# Patient Record
Sex: Male | Born: 1970 | Race: Black or African American | Hispanic: No | Marital: Married | Smoking: Never smoker
Health system: Southern US, Community
[De-identification: ages and names within clinical notes are randomized; demographics above are authoritative.]

## PROBLEM LIST (undated history)

## (undated) VITALS — BP 144/82 | HR 88 | Temp 98.8°F | Resp 20 | Ht 66.97 in | Wt 256.8 lb

## (undated) DIAGNOSIS — I10 Essential (primary) hypertension: Secondary | ICD-10-CM

## (undated) DIAGNOSIS — I509 Heart failure, unspecified: Secondary | ICD-10-CM

## (undated) DIAGNOSIS — I2699 Other pulmonary embolism without acute cor pulmonale: Secondary | ICD-10-CM

## (undated) DIAGNOSIS — I82409 Acute embolism and thrombosis of unspecified deep veins of unspecified lower extremity: Secondary | ICD-10-CM

## (undated) DIAGNOSIS — I499 Cardiac arrhythmia, unspecified: Secondary | ICD-10-CM

## (undated) DIAGNOSIS — R0902 Hypoxemia: Secondary | ICD-10-CM

## (undated) DIAGNOSIS — E119 Type 2 diabetes mellitus without complications: Secondary | ICD-10-CM

## (undated) DIAGNOSIS — I498 Other specified cardiac arrhythmias: Secondary | ICD-10-CM

## (undated) DIAGNOSIS — Z95828 Presence of other vascular implants and grafts: Secondary | ICD-10-CM

## (undated) DIAGNOSIS — R29898 Other symptoms and signs involving the musculoskeletal system: Secondary | ICD-10-CM

## (undated) DIAGNOSIS — N189 Chronic kidney disease, unspecified: Secondary | ICD-10-CM

## (undated) DIAGNOSIS — K760 Fatty (change of) liver, not elsewhere classified: Secondary | ICD-10-CM

## (undated) DIAGNOSIS — I5030 Unspecified diastolic (congestive) heart failure: Secondary | ICD-10-CM

## (undated) DIAGNOSIS — Z6841 Body Mass Index (BMI) 40.0 and over, adult: Secondary | ICD-10-CM

## (undated) DIAGNOSIS — I739 Peripheral vascular disease, unspecified: Secondary | ICD-10-CM

## (undated) HISTORY — DX: Unspecified diastolic (congestive) heart failure: I50.30

## (undated) HISTORY — DX: Fatty (change of) liver, not elsewhere classified: K76.0

## (undated) HISTORY — DX: Type 2 diabetes mellitus without complications: E11.9

## (undated) HISTORY — DX: Cardiac arrhythmia, unspecified: I49.9

## (undated) HISTORY — DX: Heart failure, unspecified: I50.9

## (undated) HISTORY — DX: Chronic kidney disease, unspecified: N18.9

## (undated) HISTORY — DX: Hypoxemia: R09.02

## (undated) HISTORY — DX: Other specified cardiac arrhythmias: I49.8

## (undated) HISTORY — DX: Other pulmonary embolism without acute cor pulmonale: I26.99

## (undated) HISTORY — DX: Body Mass Index (BMI) 40.0 and over, adult: Z684

## (undated) HISTORY — DX: Other symptoms and signs involving the musculoskeletal system: R29.898

## (undated) HISTORY — DX: Morbid (severe) obesity due to excess calories: E66.01

---

## 2004-07-08 ENCOUNTER — Other Ambulatory Visit: Payer: Self-pay

## 2004-07-08 ENCOUNTER — Inpatient Hospital Stay: Payer: Self-pay | Admitting: Internal Medicine

## 2004-08-24 ENCOUNTER — Other Ambulatory Visit: Payer: Self-pay

## 2004-08-24 ENCOUNTER — Emergency Department: Payer: Self-pay | Admitting: General Practice

## 2004-10-22 ENCOUNTER — Other Ambulatory Visit: Payer: Self-pay

## 2004-10-22 ENCOUNTER — Emergency Department: Payer: Self-pay | Admitting: Emergency Medicine

## 2005-07-13 ENCOUNTER — Emergency Department: Payer: Self-pay | Admitting: Emergency Medicine

## 2007-12-27 ENCOUNTER — Inpatient Hospital Stay: Payer: Self-pay | Admitting: Internal Medicine

## 2007-12-28 ENCOUNTER — Ambulatory Visit: Payer: Self-pay | Admitting: Internal Medicine

## 2008-01-04 ENCOUNTER — Ambulatory Visit: Payer: Self-pay | Admitting: Internal Medicine

## 2008-01-29 ENCOUNTER — Ambulatory Visit: Payer: Self-pay | Admitting: Internal Medicine

## 2010-10-29 ENCOUNTER — Encounter: Payer: Self-pay | Admitting: Cardiothoracic Surgery

## 2010-11-27 ENCOUNTER — Encounter: Payer: Self-pay | Admitting: Nurse Practitioner

## 2010-11-29 ENCOUNTER — Encounter: Payer: Self-pay | Admitting: Cardiothoracic Surgery

## 2010-11-29 ENCOUNTER — Encounter: Payer: Self-pay | Admitting: Nurse Practitioner

## 2010-12-29 ENCOUNTER — Encounter: Payer: Self-pay | Admitting: Cardiothoracic Surgery

## 2010-12-29 ENCOUNTER — Encounter: Payer: Self-pay | Admitting: Nurse Practitioner

## 2011-01-29 ENCOUNTER — Encounter: Payer: Self-pay | Admitting: Nurse Practitioner

## 2011-01-29 ENCOUNTER — Encounter: Payer: Self-pay | Admitting: Cardiothoracic Surgery

## 2011-02-28 ENCOUNTER — Encounter: Payer: Self-pay | Admitting: Nurse Practitioner

## 2011-02-28 ENCOUNTER — Encounter: Payer: Self-pay | Admitting: Cardiothoracic Surgery

## 2011-03-31 ENCOUNTER — Encounter: Payer: Self-pay | Admitting: Cardiothoracic Surgery

## 2011-03-31 ENCOUNTER — Encounter: Payer: Self-pay | Admitting: Nurse Practitioner

## 2012-03-15 ENCOUNTER — Encounter: Payer: Self-pay | Admitting: Nurse Practitioner

## 2012-03-15 ENCOUNTER — Encounter: Payer: Self-pay | Admitting: Cardiothoracic Surgery

## 2012-03-30 ENCOUNTER — Encounter: Payer: Self-pay | Admitting: Cardiothoracic Surgery

## 2012-03-30 ENCOUNTER — Encounter: Payer: Self-pay | Admitting: Nurse Practitioner

## 2012-04-30 ENCOUNTER — Encounter: Payer: Self-pay | Admitting: Nurse Practitioner

## 2012-04-30 ENCOUNTER — Encounter: Payer: Self-pay | Admitting: Cardiothoracic Surgery

## 2012-10-06 ENCOUNTER — Encounter: Payer: Self-pay | Admitting: Nurse Practitioner

## 2012-10-06 ENCOUNTER — Encounter: Payer: Self-pay | Admitting: Cardiothoracic Surgery

## 2012-10-28 ENCOUNTER — Encounter: Payer: Self-pay | Admitting: Cardiothoracic Surgery

## 2012-10-28 ENCOUNTER — Encounter: Payer: Self-pay | Admitting: Nurse Practitioner

## 2013-05-30 DIAGNOSIS — Z6841 Body Mass Index (BMI) 40.0 and over, adult: Secondary | ICD-10-CM | POA: Insufficient documentation

## 2014-11-29 ENCOUNTER — Ambulatory Visit
Admit: 2014-11-29 | Disposition: A | Discharge status: Home / Self Care | Payer: Self-pay | Attending: Internal Medicine | Admitting: Internal Medicine

## 2014-12-20 ENCOUNTER — Observation Stay
Admit: 2014-12-20 | Disposition: A | Discharge status: Home / Self Care | Payer: Self-pay | Attending: Internal Medicine | Admitting: Internal Medicine

## 2014-12-20 ENCOUNTER — Other Ambulatory Visit: Payer: Self-pay | Admitting: Orthopedic Surgery

## 2014-12-20 DIAGNOSIS — R55 Syncope and collapse: Secondary | ICD-10-CM | POA: Diagnosis not present

## 2014-12-20 DIAGNOSIS — M25561 Pain in right knee: Secondary | ICD-10-CM

## 2014-12-20 LAB — URINALYSIS, COMPLETE
Leukocyte Esterase: NEGATIVE
Nitrite: NEGATIVE
Ph: 5 (ref 4.5–8.0)
Protein: 100
SPECIFIC GRAVITY: 1.028 (ref 1.003–1.030)

## 2014-12-20 LAB — COMPREHENSIVE METABOLIC PANEL
ANION GAP: 10 (ref 7–16)
AST: 28 U/L
Albumin: 4.2 g/dL
Alkaline Phosphatase: 57 U/L
BILIRUBIN TOTAL: 0.6 mg/dL
BUN: 11 mg/dL
CHLORIDE: 106 mmol/L
CO2: 22 mmol/L
Calcium, Total: 8.7 mg/dL — ABNORMAL LOW
Creatinine: 1.17 mg/dL
EGFR (African American): >60
EGFR (Non-African Amer.): >60
Glucose: 152 mg/dL — ABNORMAL HIGH
POTASSIUM: 3.7 mmol/L
SGPT (ALT): 34 U/L
Sodium: 138 mmol/L
Total Protein: 7.8 g/dL

## 2014-12-20 LAB — APTT: Activated PTT: <23 secs — ABNORMAL LOW (ref 23.6–35.9)

## 2014-12-20 LAB — CK-MB
CK-MB: 1.5 ng/mL
CK-MB: 1.5 ng/mL

## 2014-12-20 LAB — CBC
HCT: 48.2 % (ref 40.0–52.0)
HGB: 16 g/dL (ref 13.0–18.0)
MCH: 29.9 pg (ref 26.0–34.0)
MCHC: 33.2 g/dL (ref 32.0–36.0)
MCV: 90 fL (ref 80–100)
PLATELETS: 239 10*3/uL (ref 150–440)
RBC: 5.36 10*6/uL (ref 4.40–5.90)
RDW: 13.9 % (ref 11.5–14.5)
WBC: 5.9 10*3/uL (ref 3.8–10.6)

## 2014-12-20 LAB — TROPONIN I
Troponin-I: <0.03 ng/mL
Troponin-I: <0.03 ng/mL

## 2014-12-20 LAB — PROTIME-INR
INR: 1
PROTHROMBIN TIME: 13.5 s

## 2014-12-20 LAB — LIPID PANEL
CHOLESTEROL: 187 mg/dL
HDL Cholesterol: 39 mg/dL — ABNORMAL LOW
LDL CHOLESTEROL, CALC: 128 mg/dL — AB
TRIGLYCERIDES: 101 mg/dL
VLDL Cholesterol, Calc: 20 mg/dL

## 2014-12-20 LAB — CK TOTAL AND CKMB (NOT AT ARMC)
CK, Total: 263 U/L
CK-MB: 1.5 ng/mL

## 2014-12-20 LAB — LACTIC ACID, PLASMA: Lactic Acid, Venous: 2 mmol/L

## 2014-12-20 LAB — HEMOGLOBIN A1C: Hemoglobin A1C: 5.9 %

## 2014-12-21 LAB — CBC WITH DIFFERENTIAL/PLATELET
BASOS ABS: 0 10*3/uL (ref 0.0–0.1)
Basophil %: 0.6 %
EOS ABS: 0.2 10*3/uL (ref 0.0–0.7)
Eosinophil %: 4.3 %
HCT: 43.2 % (ref 40.0–52.0)
HGB: 14.2 g/dL (ref 13.0–18.0)
LYMPHS PCT: 32.1 %
Lymphocyte #: 1.8 10*3/uL (ref 1.0–3.6)
MCH: 30 pg (ref 26.0–34.0)
MCHC: 33 g/dL (ref 32.0–36.0)
MCV: 91 fL (ref 80–100)
MONOS PCT: 13.9 %
Monocyte #: 0.8 x10 3/mm (ref 0.2–1.0)
NEUTROS ABS: 2.8 10*3/uL (ref 1.4–6.5)
NEUTROS PCT: 49.1 %
Platelet: 168 10*3/uL (ref 150–440)
RBC: 4.75 10*6/uL (ref 4.40–5.90)
RDW: 14.4 % (ref 11.5–14.5)
WBC: 5.6 10*3/uL (ref 3.8–10.6)

## 2014-12-23 DIAGNOSIS — I82413 Acute embolism and thrombosis of femoral vein, bilateral: Secondary | ICD-10-CM | POA: Diagnosis not present

## 2014-12-23 DIAGNOSIS — I82423 Acute embolism and thrombosis of iliac vein, bilateral: Secondary | ICD-10-CM | POA: Diagnosis not present

## 2014-12-23 LAB — CBC
HCT: 42.8 % (ref 40.0–52.0)
HGB: 14.1 g/dL (ref 13.0–18.0)
MCH: 29.7 pg (ref 26.0–34.0)
MCHC: 33 g/dL (ref 32.0–36.0)
MCV: 90 fL (ref 80–100)
PLATELETS: 124 10*3/uL — AB (ref 150–440)
RBC: 4.77 10*6/uL (ref 4.40–5.90)
RDW: 13.8 % (ref 11.5–14.5)
WBC: 9.4 10*3/uL (ref 3.8–10.6)

## 2014-12-23 LAB — COMPREHENSIVE METABOLIC PANEL
ANION GAP: 11 (ref 7–16)
Albumin: 3.9 g/dL
Alkaline Phosphatase: 54 U/L
BUN: 14 mg/dL
Bilirubin,Total: 1.3 mg/dL — ABNORMAL HIGH
CALCIUM: 8.5 mg/dL — AB
CHLORIDE: 93 mmol/L — AB
Co2: 25 mmol/L
Creatinine: 0.99 mg/dL
EGFR (Non-African Amer.): >60
Glucose: 127 mg/dL — ABNORMAL HIGH
POTASSIUM: 3.7 mmol/L
SGOT(AST): 19 U/L
SGPT (ALT): 24 U/L
Sodium: 129 mmol/L — ABNORMAL LOW
Total Protein: 8 g/dL

## 2014-12-23 LAB — LIPASE, BLOOD: LIPASE: 24 U/L

## 2014-12-24 ENCOUNTER — Inpatient Hospital Stay
Admit: 2014-12-24 | Discharge: 2015-01-15 | DRG: 270 | Disposition: A | Discharge status: Skilled Nursing Facility | Payer: 59 | Attending: Internal Medicine | Admitting: Internal Medicine

## 2014-12-24 DIAGNOSIS — E871 Hypo-osmolality and hyponatremia: Secondary | ICD-10-CM | POA: Diagnosis present

## 2014-12-24 DIAGNOSIS — I120 Hypertensive chronic kidney disease with stage 5 chronic kidney disease or end stage renal disease: Secondary | ICD-10-CM | POA: Diagnosis present

## 2014-12-24 DIAGNOSIS — E785 Hyperlipidemia, unspecified: Secondary | ICD-10-CM | POA: Diagnosis present

## 2014-12-24 DIAGNOSIS — Z6841 Body Mass Index (BMI) 40.0 and over, adult: Secondary | ICD-10-CM | POA: Diagnosis not present

## 2014-12-24 DIAGNOSIS — E119 Type 2 diabetes mellitus without complications: Secondary | ICD-10-CM | POA: Diagnosis present

## 2014-12-24 DIAGNOSIS — K219 Gastro-esophageal reflux disease without esophagitis: Secondary | ICD-10-CM | POA: Diagnosis present

## 2014-12-24 DIAGNOSIS — I82409 Acute embolism and thrombosis of unspecified deep veins of unspecified lower extremity: Secondary | ICD-10-CM | POA: Diagnosis present

## 2014-12-24 DIAGNOSIS — I82413 Acute embolism and thrombosis of femoral vein, bilateral: Secondary | ICD-10-CM | POA: Diagnosis present

## 2014-12-24 DIAGNOSIS — Z79899 Other long term (current) drug therapy: Secondary | ICD-10-CM | POA: Diagnosis not present

## 2014-12-24 DIAGNOSIS — N170 Acute kidney failure with tubular necrosis: Secondary | ICD-10-CM | POA: Diagnosis not present

## 2014-12-24 DIAGNOSIS — I82503 Chronic embolism and thrombosis of unspecified deep veins of lower extremity, bilateral: Secondary | ICD-10-CM | POA: Diagnosis present

## 2014-12-24 DIAGNOSIS — D631 Anemia in chronic kidney disease: Secondary | ICD-10-CM | POA: Diagnosis present

## 2014-12-24 DIAGNOSIS — Z992 Dependence on renal dialysis: Secondary | ICD-10-CM

## 2014-12-24 DIAGNOSIS — Z86711 Personal history of pulmonary embolism: Secondary | ICD-10-CM | POA: Diagnosis present

## 2014-12-24 DIAGNOSIS — Z800 Family history of malignant neoplasm of digestive organs: Secondary | ICD-10-CM | POA: Diagnosis not present

## 2014-12-24 DIAGNOSIS — I82423 Acute embolism and thrombosis of iliac vein, bilateral: Principal | ICD-10-CM | POA: Diagnosis present

## 2014-12-24 DIAGNOSIS — E876 Hypokalemia: Secondary | ICD-10-CM | POA: Diagnosis not present

## 2014-12-24 DIAGNOSIS — I82220 Acute embolism and thrombosis of inferior vena cava: Secondary | ICD-10-CM | POA: Diagnosis present

## 2014-12-24 DIAGNOSIS — N179 Acute kidney failure, unspecified: Secondary | ICD-10-CM

## 2014-12-24 DIAGNOSIS — Z803 Family history of malignant neoplasm of breast: Secondary | ICD-10-CM | POA: Diagnosis not present

## 2014-12-24 DIAGNOSIS — N186 End stage renal disease: Secondary | ICD-10-CM | POA: Diagnosis not present

## 2014-12-24 LAB — URINALYSIS, COMPLETE
BILIRUBIN, UR: NEGATIVE
Glucose,UR: NEGATIVE mg/dL (ref 0–75)
KETONE: NEGATIVE
Leukocyte Esterase: NEGATIVE
Nitrite: NEGATIVE
PH: 5 (ref 4.5–8.0)
Protein: 100
Specific Gravity: >1.06 (ref 1.003–1.030)

## 2014-12-24 LAB — CBC WITH DIFFERENTIAL/PLATELET
Basophil #: 0.1 10*3/uL (ref 0.0–0.1)
Basophil %: 0.5 %
Eosinophil #: 0 10*3/uL (ref 0.0–0.7)
Eosinophil %: 0.4 %
HCT: 43.3 % (ref 40.0–52.0)
HGB: 14.6 g/dL (ref 13.0–18.0)
Lymphocyte #: 1.6 10*3/uL (ref 1.0–3.6)
Lymphocyte %: 14.7 %
MCH: 30.2 pg (ref 26.0–34.0)
MCHC: 33.7 g/dL (ref 32.0–36.0)
MCV: 90 fL (ref 80–100)
Monocyte #: 1.4 x10 3/mm — ABNORMAL HIGH (ref 0.2–1.0)
Monocyte %: 13 %
Neutrophil #: 7.7 10*3/uL — ABNORMAL HIGH (ref 1.4–6.5)
Neutrophil %: 71.4 %
Platelet: 136 10*3/uL — ABNORMAL LOW (ref 150–440)
RBC: 4.84 10*6/uL (ref 4.40–5.90)
RDW: 14.1 % (ref 11.5–14.5)
WBC: 10.7 10*3/uL — ABNORMAL HIGH (ref 3.8–10.6)

## 2014-12-24 LAB — HEMOGLOBIN: HGB: 14.4 g/dL (ref 13.0–18.0)

## 2014-12-24 LAB — PROTIME-INR
INR: 1.2
PROTHROMBIN TIME: 15.1 s — AB

## 2014-12-24 LAB — HEPARIN LEVEL (UNFRACTIONATED)
Anti-Xa(Unfractionated): 0.28 IU/mL — ABNORMAL LOW (ref 0.30–0.70)
Anti-Xa(Unfractionated): 0.26 IU/mL — ABNORMAL LOW (ref 0.30–0.70)

## 2014-12-24 LAB — APTT: Activated PTT: 29.6 secs (ref 23.6–35.9)

## 2014-12-25 LAB — BASIC METABOLIC PANEL
Anion Gap: 11 (ref 7–16)
BUN: 24 mg/dL — ABNORMAL HIGH
Calcium, Total: 8.5 mg/dL — ABNORMAL LOW
Chloride: 93 mmol/L — ABNORMAL LOW
Co2: 26 mmol/L
Creatinine: 1.42 mg/dL — ABNORMAL HIGH
EGFR (African American): >60
EGFR (Non-African Amer.): 59 — ABNORMAL LOW
Glucose: 117 mg/dL — ABNORMAL HIGH
Potassium: 3.7 mmol/L
Sodium: 130 mmol/L — ABNORMAL LOW

## 2014-12-25 LAB — URINALYSIS, COMPLETE
BILIRUBIN, UR: NEGATIVE
Bacteria: NONE SEEN
Glucose,UR: NEGATIVE mg/dL (ref 0–75)
KETONE: NEGATIVE
LEUKOCYTE ESTERASE: NEGATIVE
Nitrite: NEGATIVE
Ph: 5 (ref 4.5–8.0)
Protein: 30
SQUAMOUS EPITHELIAL: NONE SEEN
Specific Gravity: 1.012 (ref 1.003–1.030)

## 2014-12-25 LAB — HEPARIN LEVEL (UNFRACTIONATED)
Anti-Xa(Unfractionated): 0.61 IU/mL (ref 0.30–0.70)
Anti-Xa(Unfractionated): 0.53 [IU]/mL (ref 0.30–0.70)

## 2014-12-25 LAB — HEMOGLOBIN A1C: Hemoglobin A1C: 5.9 %

## 2014-12-25 LAB — CBC WITH DIFFERENTIAL/PLATELET
Basophil #: 0 10*3/uL (ref 0.0–0.1)
Basophil %: 0.4 %
Eosinophil #: 0 10*3/uL (ref 0.0–0.7)
Eosinophil %: 0.3 %
HCT: 41.8 % (ref 40.0–52.0)
HGB: 14 g/dL (ref 13.0–18.0)
Lymphocyte #: 1.6 10*3/uL (ref 1.0–3.6)
Lymphocyte %: 17.6 %
MCH: 29.9 pg (ref 26.0–34.0)
MCHC: 33.4 g/dL (ref 32.0–36.0)
MCV: 90 fL (ref 80–100)
Monocyte #: 1.3 x10 3/mm — ABNORMAL HIGH (ref 0.2–1.0)
Monocyte %: 14.1 %
Neutrophil #: 6.1 10*3/uL (ref 1.4–6.5)
Neutrophil %: 67.6 %
Platelet: 190 10*3/uL (ref 150–440)
RBC: 4.67 10*6/uL (ref 4.40–5.90)
RDW: 14 % (ref 11.5–14.5)
WBC: 9.1 10*3/uL (ref 3.8–10.6)

## 2014-12-25 LAB — TSH: Thyroid Stimulating Horm: 2.46 u[IU]/mL

## 2014-12-25 LAB — CULTURE, BLOOD (SINGLE)

## 2014-12-26 LAB — BASIC METABOLIC PANEL
Anion Gap: 12 (ref 7–16)
BUN: 31 mg/dL — ABNORMAL HIGH
Calcium, Total: 8.5 mg/dL — ABNORMAL LOW
Chloride: 92 mmol/L — ABNORMAL LOW
Co2: 29 mmol/L
Creatinine: 1.66 mg/dL — ABNORMAL HIGH
EGFR (African American): 57 — ABNORMAL LOW
EGFR (Non-African Amer.): 49 — ABNORMAL LOW
Glucose: 112 mg/dL — ABNORMAL HIGH
Potassium: 3.4 mmol/L — ABNORMAL LOW
Sodium: 133 mmol/L — ABNORMAL LOW

## 2014-12-26 LAB — CBC WITH DIFFERENTIAL/PLATELET
Basophil #: 0 10*3/uL (ref 0.0–0.1)
Basophil %: 0.6 %
Eosinophil #: 0.1 10*3/uL (ref 0.0–0.7)
Eosinophil %: 0.7 %
HCT: 40.1 % (ref 40.0–52.0)
HGB: 13.4 g/dL (ref 13.0–18.0)
Lymphocyte #: 1.5 10*3/uL (ref 1.0–3.6)
Lymphocyte %: 21.2 %
MCH: 29.9 pg (ref 26.0–34.0)
MCHC: 33.3 g/dL (ref 32.0–36.0)
MCV: 90 fL (ref 80–100)
Monocyte #: 1.1 x10 3/mm — ABNORMAL HIGH (ref 0.2–1.0)
Monocyte %: 15.6 %
Neutrophil #: 4.5 10*3/uL (ref 1.4–6.5)
Neutrophil %: 61.9 %
Platelet: 239 10*3/uL (ref 150–440)
RBC: 4.46 10*6/uL (ref 4.40–5.90)
RDW: 13.8 % (ref 11.5–14.5)
WBC: 7.2 10*3/uL (ref 3.8–10.6)

## 2014-12-26 LAB — HEPARIN LEVEL (UNFRACTIONATED): Anti-Xa(Unfractionated): 0.37 IU/mL (ref 0.30–0.70)

## 2014-12-26 LAB — HEMOGLOBIN A1C: HEMOGLOBIN A1C: 6.1 % — AB

## 2014-12-27 LAB — CBC WITH DIFFERENTIAL/PLATELET
Basophil #: 0 10*3/uL (ref 0.0–0.1)
Basophil %: 0.6 %
Eosinophil #: 0.1 10*3/uL (ref 0.0–0.7)
Eosinophil %: 1 %
HCT: 38.9 % — ABNORMAL LOW (ref 40.0–52.0)
HGB: 12.9 g/dL — ABNORMAL LOW (ref 13.0–18.0)
Lymphocyte #: 1.3 10*3/uL (ref 1.0–3.6)
Lymphocyte %: 20.8 %
MCH: 29.9 pg (ref 26.0–34.0)
MCHC: 33.3 g/dL (ref 32.0–36.0)
MCV: 90 fL (ref 80–100)
Monocyte #: 1.1 x10 3/mm — ABNORMAL HIGH (ref 0.2–1.0)
Monocyte %: 17.8 %
Neutrophil #: 3.6 10*3/uL (ref 1.4–6.5)
Neutrophil %: 59.8 %
Platelet: 247 10*3/uL (ref 150–440)
RBC: 4.33 10*6/uL — ABNORMAL LOW (ref 4.40–5.90)
RDW: 13.8 % (ref 11.5–14.5)
WBC: 6.1 10*3/uL (ref 3.8–10.6)

## 2014-12-27 LAB — BASIC METABOLIC PANEL
Anion Gap: 11 (ref 7–16)
BUN: 32 mg/dL — ABNORMAL HIGH
Calcium, Total: 8.5 mg/dL — ABNORMAL LOW
Chloride: 92 mmol/L — ABNORMAL LOW
Co2: 29 mmol/L
Creatinine: 1.52 mg/dL — ABNORMAL HIGH
EGFR (African American): >60
EGFR (Non-African Amer.): 55 — ABNORMAL LOW
Glucose: 119 mg/dL — ABNORMAL HIGH
Potassium: 3.7 mmol/L
Sodium: 132 mmol/L — ABNORMAL LOW

## 2014-12-27 LAB — HEPARIN LEVEL (UNFRACTIONATED): Anti-Xa(Unfractionated): 0.38 IU/mL (ref 0.30–0.70)

## 2014-12-27 LAB — PSA: PSA: 0.8 ng/mL (ref 0.0–4.0)

## 2014-12-27 LAB — MAGNESIUM: Magnesium: 2.5 mg/dL — ABNORMAL HIGH

## 2014-12-28 DIAGNOSIS — Z86711 Personal history of pulmonary embolism: Secondary | ICD-10-CM | POA: Diagnosis present

## 2014-12-28 DIAGNOSIS — I82409 Acute embolism and thrombosis of unspecified deep veins of unspecified lower extremity: Secondary | ICD-10-CM | POA: Diagnosis present

## 2014-12-28 LAB — BASIC METABOLIC PANEL
Anion Gap: 11 (ref 7–16)
Anion Gap: 12 (ref 7–16)
BUN: 44 mg/dL — ABNORMAL HIGH
BUN: 57 mg/dL — AB
CALCIUM: 8.1 mg/dL — AB
Calcium, Total: 8.3 mg/dL — ABNORMAL LOW
Chloride: 95 mmol/L — ABNORMAL LOW
Chloride: 94 mmol/L — ABNORMAL LOW
Co2: 25 mmol/L
Co2: 24 mmol/L
Creatinine: 3.95 mg/dL — ABNORMAL HIGH
Creatinine: 5.55 mg/dL — ABNORMAL HIGH
EGFR (African American): 20 — ABNORMAL LOW
EGFR (African American): 13 — ABNORMAL LOW
EGFR (Non-African Amer.): 17 — ABNORMAL LOW
GFR CALC NON AF AMER: 11 — AB
Glucose: 130 mg/dL — ABNORMAL HIGH
Glucose: 121 mg/dL — ABNORMAL HIGH
Potassium: 4.1 mmol/L
Potassium: 3.8 mmol/L
Sodium: 131 mmol/L — ABNORMAL LOW
Sodium: 130 mmol/L — ABNORMAL LOW

## 2014-12-28 LAB — CBC WITH DIFFERENTIAL/PLATELET
Basophil #: 0.1 10*3/uL (ref 0.0–0.1)
Basophil %: 0.5 %
Eosinophil #: 0.1 10*3/uL (ref 0.0–0.7)
Eosinophil %: 0.7 %
HCT: 34.9 % — ABNORMAL LOW (ref 40.0–52.0)
HGB: 12.1 g/dL — ABNORMAL LOW (ref 13.0–18.0)
Lymphocyte #: 1.3 10*3/uL (ref 1.0–3.6)
Lymphocyte %: 11.5 %
MCH: 31.2 pg (ref 26.0–34.0)
MCHC: 34.8 g/dL (ref 32.0–36.0)
MCV: 90 fL (ref 80–100)
Monocyte #: 2.1 x10 3/mm — ABNORMAL HIGH (ref 0.2–1.0)
Monocyte %: 18.9 %
Neutrophil #: 7.7 10*3/uL — ABNORMAL HIGH (ref 1.4–6.5)
Neutrophil %: 68.4 %
Platelet: 224 10*3/uL (ref 150–440)
RBC: 3.9 10*6/uL — ABNORMAL LOW (ref 4.40–5.90)
RDW: 14 % (ref 11.5–14.5)
WBC: 11.3 10*3/uL — ABNORMAL HIGH (ref 3.8–10.6)

## 2014-12-28 LAB — APTT: Activated PTT: 75.2 secs — ABNORMAL HIGH (ref 23.6–35.9)

## 2014-12-28 LAB — HEMOGLOBIN
HGB: 12.2 g/dL — ABNORMAL LOW (ref 13.0–18.0)
HGB: 11.7 g/dL — AB (ref 13.0–18.0)

## 2014-12-28 LAB — HEPARIN LEVEL (UNFRACTIONATED)
Anti-Xa(Unfractionated): <0.1 IU/mL — ABNORMAL LOW (ref 0.30–0.70)
Anti-Xa(Unfractionated): 0.22 IU/mL — ABNORMAL LOW (ref 0.30–0.70)

## 2014-12-28 LAB — MAGNESIUM: Magnesium: 2.7 mg/dL — ABNORMAL HIGH

## 2014-12-28 LAB — PHOSPHORUS: Phosphorus: 6.1 mg/dL — ABNORMAL HIGH

## 2014-12-28 MED ORDER — MORPHINE SULFATE 2 MG/ML IJ SOLN
2.0000 mg | INTRAMUSCULAR | Status: DC | PRN
  Administered 2014-12-30: 2 mg via INTRAVENOUS
  Filled 2014-12-28: qty 1

## 2014-12-28 MED ORDER — POTASSIUM CHLORIDE CRYS ER 10 MEQ PO TBCR: 10.0000 meq | EXTENDED_RELEASE_TABLET | Freq: Every day | ORAL | Status: DC

## 2014-12-28 MED ORDER — POTASSIUM CHLORIDE CRYS ER 10 MEQ PO TBCR
10.0000 meq | EXTENDED_RELEASE_TABLET | Freq: Every day | ORAL | Status: DC
  Administered 2014-12-29: 10:00:00 10 meq via ORAL
  Filled 2014-12-28: qty 1

## 2014-12-28 MED ORDER — ONDANSETRON HCL 4 MG/2ML IJ SOLN
4.0000 mg | INTRAMUSCULAR | Status: DC
  Administered 2014-12-29 – 2015-01-15 (×81): 4 mg via INTRAVENOUS
  Filled 2014-12-28 (×57): qty 2

## 2014-12-28 MED ORDER — SODIUM CHLORIDE 0.9 % IJ SOLN
3.0000 mL | INTRAMUSCULAR | Status: DC | PRN
  Administered 2015-01-06: 14:00:00 3 mL via INTRAVENOUS
  Filled 2014-12-28 (×2): qty 10

## 2014-12-28 MED ORDER — ACETAMINOPHEN 325 MG PO TABS
650.0000 mg | ORAL_TABLET | ORAL | Status: DC | PRN
  Administered 2015-01-05 – 2015-01-14 (×8): 650 mg via ORAL
  Filled 2014-12-28 (×8): qty 2

## 2014-12-28 MED ORDER — INSULIN ASPART 100 UNIT/ML ~~LOC~~ SOLN
0.0000 [IU] | Freq: Three times a day (TID) | SUBCUTANEOUS | Status: DC
Start: 2014-12-29 — End: 2015-01-15

## 2014-12-28 MED ORDER — ALUM & MAG HYDROXIDE-SIMETH 200-200-20 MG/5ML PO SUSP: 30.0000 mL | ORAL | Status: DC | PRN

## 2014-12-28 MED ORDER — SODIUM CHLORIDE 0.9 % IV SOLN
INTRAVENOUS | Status: DC
  Administered 2014-12-29 – 2014-12-31 (×3): via INTRAVENOUS

## 2014-12-28 MED ORDER — INSULIN ASPART 100 UNIT/ML ~~LOC~~ SOLN: 0.0000 [IU] | Freq: Three times a day (TID) | SUBCUTANEOUS | Status: DC

## 2014-12-28 MED ORDER — DOCUSATE SODIUM 100 MG PO CAPS: 100.0000 mg | ORAL_CAPSULE | Freq: Two times a day (BID) | ORAL | Status: DC | PRN

## 2014-12-28 MED ORDER — ONDANSETRON HCL 4 MG/2ML IJ SOLN
4.0000 mg | INTRAMUSCULAR | Status: DC | PRN
  Administered 2015-01-11: 4 mg via INTRAVENOUS
  Filled 2014-12-28 (×25): qty 2

## 2014-12-28 MED ORDER — METOPROLOL TARTRATE 25 MG PO TABS
25.0000 mg | ORAL_TABLET | Freq: Two times a day (BID) | ORAL | Status: DC
  Administered 2014-12-29 – 2015-01-15 (×32): 25 mg via ORAL
  Filled 2014-12-28 (×34): qty 1

## 2014-12-28 MED ORDER — SIMETHICONE 80 MG PO CHEW: 80.0000 mg | CHEWABLE_TABLET | Freq: Three times a day (TID) | ORAL | Status: DC | PRN

## 2014-12-28 MED ORDER — PANTOPRAZOLE SODIUM 40 MG IV SOLR
40.0000 mg | Freq: Two times a day (BID) | INTRAVENOUS | Status: DC
Start: 2014-12-29 — End: 2015-01-02
  Administered 2014-12-29 – 2015-01-01 (×8): 40 mg via INTRAVENOUS
  Filled 2014-12-28 (×8): qty 40

## 2014-12-28 MED ORDER — PANTOPRAZOLE SODIUM 40 MG PO TBEC: 40.0000 mg | DELAYED_RELEASE_TABLET | Freq: Two times a day (BID) | ORAL | Status: DC

## 2014-12-28 MED ORDER — METOCLOPRAMIDE HCL 5 MG/ML IJ SOLN
5.0000 mg | Freq: Four times a day (QID) | INTRAMUSCULAR | Status: DC | PRN
  Administered 2015-01-09: 5 mg via INTRAVENOUS
  Filled 2014-12-28: qty 2

## 2014-12-28 MED ORDER — HEPARIN (PORCINE) IN NACL 100-0.45 UNIT/ML-% IJ SOLN
2100.0000 [IU]/h | INTRAMUSCULAR | Status: DC
  Administered 2014-12-29 – 2015-01-01 (×6): 2100 [IU]/h via INTRAVENOUS
  Filled 2014-12-28 (×14): qty 250

## 2014-12-29 ENCOUNTER — Inpatient Hospital Stay: Payer: 59

## 2014-12-29 LAB — CULTURE, BLOOD (SINGLE)

## 2014-12-29 LAB — BASIC METABOLIC PANEL
Anion gap: 12 (ref 5–15)
BUN: 67 mg/dL — AB (ref 6–20)
CALCIUM: 8 mg/dL — AB (ref 8.9–10.3)
CO2: 23 mmol/L (ref 22–32)
Chloride: 97 mmol/L — ABNORMAL LOW (ref 101–111)
Creatinine, Ser: 7.2 mg/dL — ABNORMAL HIGH (ref 0.61–1.24)
GFR, EST AFRICAN AMERICAN: 10 mL/min — AB (ref >60)
GFR, EST NON AFRICAN AMERICAN: 8 mL/min — AB (ref >60)
Glucose, Bld: 127 mg/dL — ABNORMAL HIGH (ref 65–99)
POTASSIUM: 4 mmol/L (ref 3.5–5.1)
Sodium: 132 mmol/L — ABNORMAL LOW (ref 135–145)

## 2014-12-29 LAB — HEPARIN LEVEL (UNFRACTIONATED)
Heparin Unfractionated: 0.78 IU/mL — ABNORMAL HIGH (ref 0.30–0.70)
Heparin Unfractionated: 0.55 IU/mL (ref 0.30–0.70)

## 2014-12-29 LAB — CBC
HCT: 32.3 % — ABNORMAL LOW (ref 40.0–52.0)
Hemoglobin: 10.9 g/dL — ABNORMAL LOW (ref 13.0–18.0)
MCH: 30.4 pg (ref 26.0–34.0)
MCHC: 33.7 g/dL (ref 32.0–36.0)
MCV: 90.5 fL (ref 80.0–100.0)
Platelets: 262 10*3/uL (ref 150–440)
RBC: 3.56 MIL/uL — ABNORMAL LOW (ref 4.40–5.90)
RDW: 14.1 % (ref 11.5–14.5)
WBC: 10.2 10*3/uL (ref 3.8–10.6)

## 2014-12-29 LAB — GLUCOSE, CAPILLARY
Glucose-Capillary: 115 mg/dL — ABNORMAL HIGH (ref 70–99)
Glucose-Capillary: 112 mg/dL — ABNORMAL HIGH (ref 70–99)
Glucose-Capillary: 104 mg/dL — ABNORMAL HIGH (ref 70–99)
Glucose-Capillary: 116 mg/dL — ABNORMAL HIGH (ref 70–99)
Glucose-Capillary: 103 mg/dL — ABNORMAL HIGH (ref 70–99)

## 2014-12-29 LAB — APTT: aPTT: 116 seconds — ABNORMAL HIGH (ref 24–36)

## 2014-12-29 NOTE — Consult Note (Signed)
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   Patient was seen by consultant.   Orders entered.   Discussed with Attending MD.   Comments: At the present time he is in bed and not in severe pain on Percocet.  He has anextensive history of DVT and has a known hypercoagulable state.  He does not have venous gangrene at thsi tiem.  Plan is to continue heparin for 48 hours and then let him ambulate.  At that point if he is severely debilitated I will again discuss thrombolysis with him.  Electronic Signatures: Levora DredgeSchnier, Calin Fantroy (MD)  (Signed 26-Apr-16 18:50)  Authored: Brief Consult Note   Last Updated: 26-Apr-16 18:50 by Levora DredgeSchnier, Izzy Courville (MD)

## 2014-12-29 NOTE — Consult Note (Signed)
PATIENT NAME:  PASHA, BROAD MR#:  [id number] DATE OF BIRTH:  08-Jul-1971  DATE OF CONSULTATION:  12/24/2014  REFERRING PHYSICIAN:  Dr. Imogene Burn CONSULTING PHYSICIAN:  Elisheva Fallas R. Sherrlyn Hock, MD  REASON FOR CONSULTATION: Recurrent extensive DVT.   HISTORY OF PRESENT ILLNESS: The patient is a 44 year old African American gentleman with past medical history significant for hypertension, hyperlipidemia, MCL tear of right knee, obesity, metabolic syndrome, known history of DVT and pulmonary embolism (records not available, details provided by patient. States that he was around 44 years of age when he was first diagnosed with lower extremity DVT and pulmonary embolism, states that he has been evaluated at [facility], but more than 8 to 10 years ago, and does not remember details of any hypercoagulable workup, but was told that he might have inherited risk for clotting). The patient states that for the last many years he has been followed by Dr. Dario Guardian and has been on Coumadin, sometimes he takes Coumadin on irregular basis and misses quite a few doses and that sometimes INR has been below therapeutic range. More recently, he has also noticed some unintentional weight loss. He presented this time with severe right lower extremity pain for which there is swelling in both lower extremities.   Doppler shows extensive deep venous thrombosis filling nearly all the visualized veins of the right leg, there is a thrombosis in the contralateral common femoral vein. He is on IV heparin, states that lower extremity pain is under better control today. He has been seen by vascular surgery, Dr. Gilda Crease. He also had a CT scan of the abdomen and pelvis done, which is reporting bilateral inguinal lymphadenopathy up to 2.5 cm of unclear etiology, (?) secondary to thrombophlebitis versus other etiology. Also, a CT scan reported that lower extremity thrombosis extends along the common iliac veins bilaterally and along the inferior vena  cava to approximately 3.7 cm above the level of an IVC filter, which the patient states that he had placed by Dr. Earnestine Leys many years ago. He does give family history of father had prostate cancer around age 43 and mother with history of breast cancer. He has one daughter who is age 44, states that she is doing well.   Otherwise, no fevers or night sweats. No new chest pain, cough, or dyspnea at rest. No bleeding symptoms. No new bone pains.   PAST MEDICAL AND SURGICAL HISTORY: As in HPI above.   FAMILY HISTORY: Mother had breast cancer. Father had prostate cancer around age 70. Also states that multiple family members have history of thromboembolic phenomena including uncles and cousins.   SOCIAL HISTORY: Denies smoking, alcohol or recreational drug usage. Lives with his wife.   HOME MEDICATIONS: Coumadin, metoprolol 25 mg b.i.d., hydrochlorothiazide 25 mg at bedtime half tablet, metformin 500 mg b.i.d., Gas-X 80 mg daily p.r.n. for gas, tramadol 50 mg q. 8 hours p.r.n. for pain.   ALLERGIES: No known drug allergies.   REVIEW OF SYSTEMS:  CONSTITUTIONAL: Difficulty ambulating due to lower extremity swelling and pain. Otherwise, states that he tries to remain physically active. No fevers or chills. No night sweats.  HEENT: No new headaches or dizziness. No epistaxis, ear or jaw pain.  CARDIAC: Denies any angina, palpitation, orthopnea, or PND.  LUNGS: Has dyspnea on exertion, but no new cough, chest pain, or hemoptysis.  GASTROINTESTINAL: No nausea or vomiting. No diarrhea. No bright red blood in stools or melena.  GENITOURINARY: No dysuria or nocturia.  SKIN: Has chronic skin changes  in both legs, otherwise denies any new rashes or pruritus.  HEMATOLOGIC: No bleeding issues.  NEUROLOGIC: No new focal weakness, seizures or loss of consciousness.  No polyuria or polydipsia.   PHYSICAL EXAMINATION:  GENERAL: The patient is a moderately-built and well-nourished individual, resting in bed, alert  and oriented and converses appropriately. No icterus.  VITAL SIGNS: Stable, afebrile.  HEENT: Normocephalic, atraumatic. Extraocular movements intact. Ears clear. No oral thrush.  NECK: Negative for lymphadenopathy. No use of accessory muscles. HEART: S1, S2, regular rate and rhythm.  LUNGS: Show bilateral diminished breath sounds at bases, no rhonchi.  ABDOMEN: Soft, nontender. No hepatosplenomegaly clinically.  LYMPHATICS: Obese, has bilateral inguinal adenopathy palpable about 2 cm.  EXTREMITIES: Shows bilateral extensive edema, chronic hyperpigmentation in the lower legs. No cyanosis.  NEUROLOGIC: Cranial nerves intact, moves all extremities spontaneously.   LABORATORY RESULTS: WBC 10,700, hemoglobin 14.6, platelets 136,000. The ANC 7700. Creatinine 0.99, calcium 8.5. LFTs unremarkable except bilirubin 1.3, INR 1.2.   IMPRESSION AND RECOMMENDATIONS: A 44 year old gentleman with history of deep venous thrombosis and pulmonary embolism at age 44, reportedly unprovoked and has been on chronic anticoagulation with Coumadin. The patient admits to missing frequent doses of Coumadin and states that INR has been subtherapeutic on and off. Currently, he is admitted with extensive bilateral lower extremity deep venous thromboses extending into bilateral iliac wings and also IVC above the level of the IVC filter that he has had for many years now. Vascular surgery is also following. The patient is on IV heparin and clinically is beginning to improve with regards to pain. Still has significant swelling in lower extremities. It is unclear as to what kind of workup he has had at [facility], and it was more than 10 years ago per patient report, we will therefore get hypercoagulable panel checked including factor V Leiden, factor mutation, anticardiolipin antibody, beta-2 glycoprotein 1 panel. Protein C and S panel has already been sent by hospitalist. Will also get a serum PSA. CT scan of the abdomen and  pelvis otherwise does not report any pancreatic abnormality or other obvious malignancy, but he does have bilateral inguinal lymphadenopathy. Given this along with unintentional weight loss and recurrent thromboembolic phenomena, we will get PET scan to evaluate for possibility of  lymphoma or other malignancy. Will follow up after results of workup are available and make further recommendations. Regarding anticoagulation, we will defer to hospitalist and vascular surgery, one recommendation is that he could continue on Lovenox therapy for the next 4 to 8 weeks to see if symptoms of deep venous thrombosis improves better, and then go back on Coumadin with a slightly higher target INR of 2.5 - 3.5. The patient has been advised that he needs to be compliant with blood thinners. Will continue to follow.   Thank you for the referral. Please feel free to contact me for additional questions.    ____________________________ Maren ReamerSandeep R. Sherrlyn HockPandit, MD srp:TM D: 12/25/2014 00:26:34 ET T: 12/25/2014 00:55:47 ET JOB#: [id number]  cc: Darryll CapersSandeep R. Sherrlyn HockPandit, MD, <Dictator> Wille CelesteSANDEEP R Demetruis Depaul MD ELECTRONICALLY SIGNED 12/25/2014 9:35

## 2014-12-29 NOTE — Evaluation (Signed)
Physical Therapy Evaluation Patient Details Name: Ryan [name] MRN: [id number] DOB: [birth year] Today's Date: 12/29/2014   History of Present Illness  44 yo Male came to ED with acute DVT, edema and shortness of breath. Patient was started on Heparin drip  and on 4/28 underwent mechanical thrombectomy R/L venous system and IVC filter placement. Pateint has a PMH significant for right MCL tear. He also reports dizziness with positional change.  Clinical Impression  44 yo male came to ED with acute DVT, edema and shortness of breath. Patient presents with increased swelling in BLE and overall weakness. He has intact LE light touch sensation. Patient was independent and walking with a SPC prior to admittance. He reports sleeping in a recliner as getting in/out of bed was difficult. Currently patient is modified independent in bed mobility with elevated HOB and rails; He is Min A for sit<>stand transfer with RW and min A for taking a few steps. Patient was very dizzy upon standing; He was orthostatic with sitting BP 152/75 and standing BP 133/79; Patient also demonstrates significant LE weakness with difficulty achieving full AROM against gravity. Patient would benefit from additional skilled PT intervention to improve mobility and safety. Recommend patient receive home health PT upon discharge; He would also benefit from a bariatric RW to assist with safety and walking at home.    Follow Up Recommendations Home health PT    Equipment Recommendations  Other (comment) (Bariatric front wheeled rolling walker)    Recommendations for Other Services       Precautions / Restrictions Restrictions Weight Bearing Restrictions: No Other Position/Activity Restrictions: FWB      Mobility  Bed Mobility Overal bed mobility: Modified Independent             General bed mobility comments: needs elevated head of bed and bed rail, but able to complete supine to sit modified independent; slow with movement  of LE  Transfers Overall transfer level: Needs assistance Equipment used: Rolling walker (2 wheeled) Transfers: Sit to/from Stand Sit to Stand: Supervision         General transfer comment: pt able to transfer sit<>stand with RW, supervision requiring min Vcs for hand placement; patient reports getting dizzy upon standing; please see vital signs as patient exhibits orthostatic BP  Ambulation/Gait Ambulation/Gait assistance: Min guard Ambulation Distance (Feet): 2 Feet Assistive device: Rolling walker (2 wheeled);1 person hand held assist       General Gait Details: pt took 2 steps from bed to bedside chair with RW, min A x1 with wide base of support and increased trunk sway with slow gait speed and unsteady gait pattern; demonstrates decreased hip/knee flexion in swing, decreased foot clearance and step to pattern.  Stairs            Wheelchair Mobility    Modified Rankin (Stroke Patients Only)       Balance Overall balance assessment: Needs assistance Sitting-balance support: Single extremity supported Sitting balance-Leahy Scale: Fair Sitting balance - Comments: does require 1 HHA, able to keep balance in sitting against mod pertubations   Standing balance support: Single extremity supported Standing balance-Leahy Scale: Poor Standing balance comment: requires CGA-min A for safety, demonstrates forward trunk lean with wide base of support                             Pertinent Vitals/Pain Pain Assessment: 0-10 Pain Score: 1  Pain Location: stomach Pain Descriptors / Indicators: Tightness;Discomfort Pain  Intervention(s): Repositioned    Home Living Family/patient expects to be discharged to:: Private residence Living Arrangements: Spouse/significant other (wife is home during the day;) Available Help at Discharge: Family Type of Home: House Home Access: Stairs to enter   Secretary/administratorntrance Stairs-Number of Steps: 1 step Home Layout: Two level Home  Equipment: Cane - single point      Prior Function Level of Independence: Independent         Comments: was working full-time from home for [employer].     Hand Dominance        Extremity/Trunk Assessment   Upper Extremity Assessment: Overall WFL for tasks assessed           Lower Extremity Assessment: RLE deficits/detail;LLE deficits/detail RLE Deficits / Details: Patient exhibits significant edema with darkened skin for lower leg; demonstrates decreased ROM due to edema; gross strength appears 3/5 as patient able to stand modified independent however unable to maintain against minimal resistance during MMT; intact sensation LLE Deficits / Details: Patient exhibits significant edema with darkened skin for lower leg; demonstrates decreased ROM due to edema; gross strength appears 3/5 as patient able to stand modified independent however unable to maintain against minimal resistance during MMT; intact sensation  Cervical / Trunk Assessment: Normal  Communication   Communication: No difficulties  Cognition Arousal/Alertness: Awake/alert Behavior During Therapy: WFL for tasks assessed/performed Overall Cognitive Status: Within Functional Limits for tasks assessed                      General Comments      Exercises General Exercises - Lower Extremity Ankle Circles/Pumps: AROM;Both;10 reps;Seated;Strengthening Short Arc Quad: AROM;Strengthening;Both;10 reps;Seated Other Exercises Other Exercises: Instructed patient in BUE arm pumps in sitting to reduce dizziness for orthostasis; instructed patient to perform LE exercise at least 1x every hour for increased circulation/reduce edema and to improve strength; patient required min Vcs to increase ROM with ankle pumps for increased muscle activation to improve fluid movement.      Assessment/Plan    PT Assessment Patient needs continued PT services  PT Diagnosis Difficulty walking;Generalized weakness   PT  Problem List Decreased strength;Decreased range of motion;Decreased safety awareness;Decreased activity tolerance;Pain;Cardiopulmonary status limiting activity;Decreased balance;Decreased mobility;Obesity  PT Treatment Interventions Gait training;Stair training;Functional mobility training;Therapeutic activities;Therapeutic exercise;Balance training;Patient/family education   PT Goals (Current goals can be found in the Care Plan section) Acute Rehab PT Goals Patient Stated Goal: to go home PT Goal Formulation: With patient Time For Goal Achievement: 01/19/15 Potential to Achieve Goals: Good    Frequency Min 2X/week   Barriers to discharge   pt has 1 step to enter but feels safe to negotiate step; has wife at home during day    Co-evaluation               End of Session Equipment Utilized During Treatment: Gait belt Activity Tolerance: Patient limited by fatigue;Patient limited by lethargy;Other (comment) (orthostatic upon standing with dizziness.) Patient left: in chair;with call bell/phone within reach;with chair alarm set;with nursing/sitter in room Nurse Communication: Mobility status         Time: [id number] PT Time Calculation (min) (ACUTE ONLY): 35 min   Charges:   PT Evaluation $Initial PT Evaluation Tier I: 1 Procedure PT Treatments $Therapeutic Exercise: 8-22 mins   PT G Codes:        Hopkins,Myeisha Kruser 12/29/2014, 11:13 AM

## 2014-12-29 NOTE — Plan of Care (Signed)
Problem: Discharge Progression Outcomes Goal: Discharge plan in place and appropriate Individualization Pt likes to be called Ryan [name] Pt admitted on April 22-24th and readmitted with the same medical dx today. Pt has a medical hx of DVT and PE which pt said has been recurrent for numerous years. History of HTN and metabolic syndrome controlled by home meds. Moderate fall risk, no bed alarm activated, hourly roudning performed. Goal: Other Discharge Outcomes/Goals Plan of care progress to goal for: DVT - PAD's in place. - No complaints of pain. - Heparin drip continues.

## 2014-12-29 NOTE — Consult Note (Signed)
HEMATOLOGY follow-up note -feels leg pain is under better control. No new SOB or chest pain. denies bleeding symptoms. No fevers.Resting in bed, alert and oriented, NAD.           vitals - afebrile, stable           lungs - decreased breath sounds at bases, no rhonchi           abd - soft, nontender           ext - b/l leg edema and skin discolorationCBC unremarkable, Cr 1.42.   12/25/14 - PET Scan. IMPRESSION:  1. Moderately degraded exam secondary to patient body habitus. 2. External iliac and inguinal hypermetabolic adenopathy. Although this could be reactive or neoplastic, the extent of hypermetabolism within the right external iliac node is suspicious for a lymphoproliferative process. Potential clinical strategies include sampling of inguinal or right external iliac nodes (if possible) versus CT followup after treatment of the acute thrombophlebitis (4-6 weeks). 3. No convincing evidence of extrapelvic hypermetabolic adenopathy.   44 year old gentleman with history of deep venous thrombosis and pulmonary embolism at age 44, reportedly unprovoked and has been on chronic anticoagulation with Coumadin. The patient admits to missing frequent doses of Coumadin and states that INR has been subtherapeutic on and off, INR was 1.2 upon admission p. Currently admitted with extensive bilateral lower extremity deep venous thromboses extending into bilateral iliac wings and also IVC above the level of the IVC filter that he has had for many years now. Vascular surgery is also following. The patient is on IV heparin and clinically is beginning to improve with regards to pain. Still has significant swelling in lower extremities. It is unclear as to what kind of [facility] has [facility], and it was more than 10 years ago per patient report, therefore hypercoagulable panel has been sent (factor V Leiden, factor mutation, anticardiolipin antibody, beta-2 glycoprotein 1 panel, Protein C and S panel). Serum PSA  is pending. scan of the abdomen and pelvis otherwise does not report any pancreatic abnormality or other obvious malignancy, but he does have bilateral inguinal lymphadenopathy. Given this along with unintentional weight loss and recurrent thromboembolic phenomena, PET scan reports hypermetabolic lymphadenopathy only in inguinal and iliac areas and this could be more likely inflammatory than malignant given b/l remarkable LE thrombophlebitis. Plan is to watch and consider repeat CT scan in 3-4 months. Will continue to follow.    Electronic Signatures: Izola PricePandit, Jennice Renegar Raj (MD)  (Signed on 27-Apr-16 22:57)  Authored  Last Updated: 27-Apr-16 22:57 by Izola PricePandit, Dejuan Elman Raj (MD)

## 2014-12-29 NOTE — Progress Notes (Signed)
Patient ID: Ryan [name], male   DOB: [birth year], 44 y.o.   MRN: [id number] [facility] PROGRESS NOTE  Ryan [name] VHQ:[id number] DOB: 1970/11/24 DOA: 12/28/2014 PCP: No primary care provider on file.  HPI/Subjective: The patient had nausea and vomiting at 3:30 AM this morning. He still has not been able to eat very much at all. Decreased urination. Only urinated once when he had a small bowel movement. The nausea vomiting was worse since the procedure.   Objective: Filed Vitals:   12/29/14 0645  BP: 134/80  Pulse: 92  Temp: 98.4 F (36.9 C)  Resp: 20    Intake/Output Summary (Last 24 hours) at 12/29/14 1133 Last data filed at 12/29/14 0900  Gross per 24 hour  Intake   1188 ml  Output    250 ml  Net    938 ml   Filed Weights   12/27/14 1322  Weight: 148.5 kg (327 lb 6.1 oz)    ROS: Review of Systems  Constitutional: Negative for fever and chills.  Eyes: Negative for blurred vision.  Respiratory: Negative for cough and shortness of breath.   Cardiovascular: Negative for chest pain.  Gastrointestinal: Positive for nausea and vomiting. Negative for diarrhea and constipation.  Genitourinary: Negative for dysuria.  Musculoskeletal: Negative for joint pain.  Neurological: Negative for dizziness and headaches.   Exam: Physical Exam  HENT:  Nose: No mucosal edema.  Mouth/Throat: No oropharyngeal exudate or posterior oropharyngeal edema.  Eyes: Conjunctivae, EOM and lids are normal. Pupils are equal, round, and reactive to light.  Neck: No JVD present. Carotid bruit is not present. No edema present. No thyroid mass and no thyromegaly present.  Cardiovascular: S1 normal and S2 normal.  Exam reveals no gallop.   No murmur heard. Pulses:      Dorsalis pedis pulses are 1+ on the right side, and 1+ on the left side.  Respiratory: No respiratory distress. He has no wheezes. He has no rhonchi. He has no rales.  GI: Soft. Bowel sounds are normal. There is no  tenderness.  Lymphadenopathy:    He has no cervical adenopathy.  Neurological: He is alert. No cranial nerve deficit.  Skin: Skin is warm. No rash noted. Nails show no clubbing.  Psychiatric: He has a normal mood and affect.  Skin chronic lower extremity discoloration.   Extremities: 3+ lower extremity edema   Data Reviewed: Basic Metabolic Panel:  Recent Labs Lab 12/26/14 0520 12/27/14 0515 12/28/14 0554 12/28/14 1602 12/29/14 0436  NA  --   --   --   --  132*  K  --   --   --   --  4.0  CL  --   --   --   --  97*  CO2 GLUCOSE  --   --   --   --  127*  BUN 31* 32* 44* 57* 67*  CREATININE 1.66* 1.52* 3.95* 5.55* 7.20*  CALCIUM 8.5* 8.5* 8.3* 8.1* 8.0*   Liver Function Tests:  Recent Labs Lab 12/23/14 2219  AST 19  ALT 24  ALKPHOS 54  PROT 8.0  ALBUMIN 3.9   CBC:  Recent Labs Lab 12/24/14 0800  12/25/14 0637 12/26/14 0520 12/27/14 0515 12/28/14 0121 12/28/14 0444 12/28/14 1602 12/29/14 0436  WBC 10.7*  --  9.1 7.2 6.1  --  11.3*  --  10.2  NEUTROABS 7.7*  --  6.1 4.5 3.6  --  7.7*  --   --  HGB 14.6  < > 14.0 13.4 12.9* 12.2* 12.1* 11.7* 10.9*  HCT 43.3  --  41.8 40.1 38.9*  --  34.9*  --  32.3*  MCV 90  --  90 90 90  --  90  --  90.5  PLT 136*  --  190 239 247  --  224  --  262  < > = values in this interval not displayed.  CBG:  Recent Labs Lab 12/29/14 0724  GLUCAP 115*    Recent Results (from the past 240 hour(s))  Culture, blood (single)     Status: None   Collection Time: 12/20/14  8:16 AM  Result Value Ref Range Status   Micro Text Report   Final       COMMENT                   NO GROWTH AEROBICALLY/ANAEROBICALLY IN 5 DAYS   ANTIBIOTIC                                                      Culture, blood (single)     Status: None   Collection Time: 12/20/14  9:21 AM  Result Value Ref Range Status   Micro Text Report   Final       COMMENT                   NO GROWTH AEROBICALLY/ANAEROBICALLY IN 5 DAYS    ANTIBIOTIC                                                      Culture, blood (single)     Status: None (Preliminary result)   Collection Time: 12/27/14  8:34 PM  Result Value Ref Range Status   Micro Text Report   Preliminary       COMMENT                   NO GROWTH IN 36 HOURS   ANTIBIOTIC                                                      Culture, blood (single)     Status: None (Preliminary result)   Collection Time: 12/27/14  8:40 PM  Result Value Ref Range Status   Micro Text Report   Preliminary       COMMENT                   NO GROWTH IN 36 HOURS   ANTIBIOTIC                                                         Studies: No results found.  Scheduled Meds: . insulin aspart  0-10 Units Subcutaneous TID AC & HS  . metoprolol tartrate  25 mg Oral BID  .  ondansetron (ZOFRAN) IV  4 mg Intravenous 6 times per day  . pantoprazole (PROTONIX) IV  40 mg Intravenous Q12H   Continuous Infusions: . sodium chloride    . heparin 2,100 Units/hr (12/29/14 1055)    Assessment/Plan: #1 acute renal failure unspecified. Since the procedure the creatinine has worsened. Today the creatinine is 7.2. The patient has poor urine output. I spoke with Dr. Mady HaagensenMunsoor [name] nephrologist to see the patient today and make his assessment. The patient will be continued on IV fluid hydration. The kidney function has worsened since the patient received a new IVC filter and thrombolysis. The patient may end up needing temporary dialysis #2 extensive deep vein thrombosis. Patient is status post IVC filter and thrombolysis. The patient is on heparin drip. Oncology recommended Lovenox injections upon going home but I will be unable to do that secondary to acute renal failure. Continue heparin drip at this point. #3 hypertension essential continue metoprolol. #4 hyponatremia. Today's sodium is 132. Hydrochlorothiazide stopped. #5 hypokalemia. We will stop potassium supplementation with worsening  kidney function. Hydrochlorothiazide stopped. #6 metabolic syndrome #7 diabetes mellitus type 2 hemoglobin A1c 6.1 sliding scale coverage for now. #8 nausea vomiting could be secondary to the acute renal failure. Standing dose Zofran and when necessary Reglan and IV Protonix. #9 weakness physical therapy evaluation.   Code Status:     Code Status Orders        Start     Ordered   12/29/14 0001  Full code   Continuous     12/28/14 1152     Family Communication: Aunt at bedside. Disposition Plan: Likely home once acute renal failure resolved.  Consultants:  Dr. Lorretta HarpSchneir vascular surgery  Dr. Cherylann RatelLateef nephrology  Procedures:  IVC filter placement    Time spent: 30 minutes coordination of care    Alford HighlandWIETING, Ryan Lucchesi  [facility]

## 2014-12-29 NOTE — Progress Notes (Signed)
For prior documentation, see [facility] [id number]

## 2014-12-29 NOTE — Progress Notes (Signed)
12/29/2014 8:06 PM DR. SONA PATEL NOTIFIED OF PT VOIDING 50 ML OF DARK BLACK URINE AFTER 2 DAYS OF NOT VOIDING, PT WITHOUT C/O, NO ORDERS RECEIVED. Ryan LloydMonique D Emryn Flanery, RN

## 2014-12-29 NOTE — Progress Notes (Signed)
ANTICOAGULATION CONSULT NOTE - Initial Consult  Pharmacy Consult for Jena GaussLee, Elex  Indication: DVT  No Known Allergies  Patient Measurements: Height: 5' 6.97" (170.1 cm) Weight: (!) 327 lb 6.1 oz (148.5 kg) IBW/kg (Calculated) : 66.03 Heparin Dosing Weight: 102  Vital Signs: Temp: 97.9 F (36.6 C) (05/01 1239) Temp Source: Oral (05/01 1239) BP: 128/75 mmHg (05/01 1239) Pulse Rate: 76 (05/01 1239)  Labs:  Recent Labs  12/27/14 0515  12/28/14 0444 12/28/14 0554 12/28/14 0810 12/28/14 1541 12/28/14 1602 12/29/14 0436 12/29/14 1538  HGB 12.9*  < > 12.1*  --   --   --  11.7* 10.9*  --   HCT 38.9*  --  34.9*  --   --   --   --  32.3*  --   PLT 247  --  224  --   --   --   --  262  --   APTT  --   --   --   --   --  75.2*  --  116*  --   HEPARINUNFRC 0.38  --   --  < 0.10* 0.22*  --   --  0.78* 0.55  CREATININE 1.52*  --   --  3.95*  --   --  5.55* 7.20*  --   < > = values in this interval not displayed.  Estimated Creatinine Clearance: 18.1 mL/min (by C-G formula based on Cr of 7.2).   Medical History: No past medical history on file.  Medications:  Scheduled:  . insulin aspart  0-10 Units Subcutaneous TID AC & HS  . metoprolol tartrate  25 mg Oral BID  . ondansetron (ZOFRAN) IV  4 mg Intravenous 6 times per day  . pantoprazole (PROTONIX) IV  40 mg Intravenous Q12H    Assessment: Anti-Xa is within goal range Goal of Therapy:  Heparin level 0.3-0.7 units/ml Monitor platelets by anticoagulation protocol: Yes   Plan:  Will continue heparin infusion at 2100 units/hr and check a confirmatory anti-Xa in 8 h. CBC ordered for AM.  Luisa Harthristy, Robert Sperl D 12/29/2014,4:21 PM

## 2014-12-29 NOTE — H&P (Addendum)
PATIENT NAME:  Ryan [name], Xhaiden A MR#:  [id number] DATE OF BIRTH:  [birth year]  DATE OF ADMISSION:  12/24/2014  REFERRING PHYSICIAN: Dorothea GlassmanPaul Malinda, MD   PRIMARY CARE DOCTOR: Marlyn CorporalFayegh H. Jadali, MD    ADMIT DIAGNOSIS: Acute occlusive deep vein thrombosis of the right lower extremity and common iliac veins.   HISTORY OF PRESENT ILLNESS: This is a 44 year old African American male who presented to the Emergency Department via EMS complaining of pain in his right lower extremity. The patient has been having difficulty walking as well as experiencing some dizziness and lightheadedness. He was seen in the Emergency Department a few days ago for the same and admitted to the hospital. He returns today with a swollen right lower extremity and was found to have an extensive DVT of the right femoral and iliac system. He denies any chest pain but admits to shortness of breath, which has been an ongoing problem for the last few weeks. He also complains of an occasional cough. The patient is aware that he has a clotting disorder but cannot remember the name. [facility] Due to his extensive clot burden, the Emergency Department staff called for admission.   REVIEW OF SYSTEMS:   CONSTITUTIONAL: The patient denies fevers or weakness.  EYES: Denies blurred vision or inflammation.  EARS, NOSE, AND THROAT: Denies tinnitus or sore throat.  RESPIRATORY:  Admits to cough and shortness of breath.  CARDIOVASCULAR: Denies chest pain, palpitations, orthopnea, paroxysmal nocturnal dyspnea.  GASTROINTESTINAL: Denies nausea, vomiting, diarrhea, or abdominal pain.  GENITOURINARY: Denies dysuria, increased frequency or hesitancy of urination.  HEMATOLOGIC AND LYMPHATIC: Denies easy bruising or bleeding.  ENDOCRINE: Denies polyuria or polydipsia.  INTEGUMENTARY: Denies rashes or lesions.  MUSCULOSKELETAL: Denies arthralgias or myalgias.  NEUROLOGIC: Denies numbness in his extremities or dysarthria.   PSYCHIATRIC: Denies depression or suicidal ideation.   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    SURGICAL HISTORY: IVC filter placement.   SOCIAL HISTORY: The patient lives with his wife. He does not smoke, drink, or do any drugs.   FAMILY HISTORY: Significant for clotting disorder in his uncles and cousins, one of whom just died from a pulmonary embolism.   MEDICATIONS:  1.  Gas-X 80 mg 1 tablet p.o. daily as needed for gas.  2.  Hydrochlorothiazide 25 mg half a tablet p.o. at bedtime.  3.  Metformin 500 mg 1 tablet p.o. b.i.d.  4.  Metoprolol tartrate 25 mg 1 tablet p.o. b.i.d.  5.  Tramadol 50 mg 1 tablet p.o. every 8 hours as needed for pain.   ALLERGIES: No known drug allergies.   PERTINENT LABORATORY RESULTS AND RADIOGRAPHIC FINDINGS: Serum glucose is 127, BUN is 14, creatinine 0.99, serum sodium is 129, potassium is 3.7, chloride is 93, bicarbonate is 25, calcium is 8.5, lipase is 24, alkaline phosphatase 54, AST 19, ALT 24, serum albumin is 3.9, white blood cell count 9.4, hemoglobin 14.1, hematocrit 42.8, platelet count is 124,000, MCV is 90, INR is 1.2, APTT is 29.6. Urinalysis shows 100 mg/dL of protein, 3+ blood, and too numerous to count red blood cells per high-power field. The patient is asymptomatic for urinary tract infection. CT of the abdomen and pelvis with contrast shows a complete occlusion of the right femoral vein, profunda vein, and superficial femoral vein with diffuse surrounding soft tissue edema. There is occlusion of the  left common femoral vein through the left profunda femoral vein and superficial vein appeared to demonstrate some flow. Also complete thrombosis extends along the common iliac veins bilaterally and along the inferior vena cava to approximately 3.7 cm above the level of the IVC filter. There is mild soft  tissue inflammation about the bladder which could reflect cystitis. There are also enlarged bilateral lymph nodes measuring up to 2.5 cm on the right, which may reflect thrombophlebitis. There is scattered diverticulosis along the sigmoid colon without evidence of diverticulitis. Ultrasound of the right lower extremity shows occlusive thrombus filling nearly all visualized veins of the right leg. There is also an occlusive thrombus within the contralateral common femoral vein. The remainder of the left leg is not assessed in this particular study.   PHYSICAL EXAMINATION:  VITAL SIGNS: Temperature is 98.8, pulse 113, respirations 20, blood pressure is 128/84, pulse oximetry is 92% on room air.  GENERAL: The patient is alert and oriented x 3 in no apparent distress.  HEENT: Normocephalic, atraumatic. Pupils equal, round, and reactive to light and accommodation. Extraocular movements are intact. Mucous membranes are moist.  NECK: Trachea is midline. No adenopathy. Thyroid is nonpalpable, nontender.  CHEST: Symmetric and atraumatic.  CARDIOVASCULAR: Tachycardic rate, normal rhythm S1, S2. No rubs, clicks, or murmurs appreciated.  LUNGS: Clear to auscultation bilaterally. Normal effort and excursion.  ABDOMEN: Positive bowel sounds. Soft, nontender, nondistended. No hepatosplenomegaly.  GENITOURINARY: Deferred.  MUSCULOSKELETAL: The patient moves his  upper extremities with full range of motion. His right lower extremity has limited range of motion due to pain. His left lower extremity has 5 out of 5 strength. Right lower extremity untested due to pain.  SKIN: Warm and dry. There are no rashes but the patient does have lesions of his lower calves and shin consistent with chronic stasis dermatitis and poor lymphatic drainage.  EXTREMITIES: No clubbing or cyanosis. The patient does have swelling of his right leg. In particular, I do not feel any cords. Homan sign is negative.  NEUROLOGIC: Cranial nerves II  through XII are grossly intact.  PSYCHIATRIC: Mood is normal. Affect is congruent. The patient seems to have good insight into his medical condition. His judgment is questionable given that he is currently not on any anticoagulant when he knows he has a clotting disorder.   ASSESSMENT AND PLAN: This is a 44 year old gentleman admitted for acute occlusive deep vein thrombosis of the right lower extremity and common iliac veins and some of the left femoral vein.  1.  Deep vein thrombosis. This is an extensive occlusive clot in the right femoral system as well as part of the left femoral system and iliac veins bilaterally. The patient reports knowing that he has a clotting disorder but cannot recall the name or type. Given his family history as well as the presence of blood in his urine and obvious clotting are present on admission, he probably has factor V Leiden deficiency. I will check a protein C and S levels. Also, I will check activated protein C resistance. Vascular surgery has been consulted for possible thrombectomy.  2.  Hypertension. Continue metoprolol and hydrochlorothiazide.  3.  Hyponatremia. This is possibly secondary to dehydration. We will gently hydrate the patient with intravenous fluid.  4.  Metabolic syndrome. We will check the patient's hemoglobin A1c.  5.  Deep vein thrombosis. The patient is on treatment doses of heparin.  6.  Gastrointestinal prophylaxis. None as the patient is not critically ill.   CODE STATUS:  The patient is a full code.   TIME SPENT ON ADMISSION ORDERS AND PATIENT CARE: Approximately 35 minutes.     ____________________________ Kelton Pillar. Sheryle Hail, MD msd:AT D: 12/24/2014 05:03:24 ET T: 12/24/2014 06:15:17 ET JOB#: [id number]  cc: Kelton Pillar. Sheryle Hail, MD, <Dictator> Kelton Pillar Tkai Large MD ELECTRONICALLY SIGNED 12/31/2014 17:58

## 2014-12-29 NOTE — H&P (Signed)
PATIENT NAME:  Ryan [name], Ryan [name] MR#:  [id number] DATE OF BIRTH:  November 29, 1970  DATE OF ADMISSION:  12/20/2014  ADMITTING PHYSICIAN: Gladstone Lighter, M.D.   PRIMARY CARE PHYSICIAN: Isla Pence, MD   CHIEF COMPLAINT: Presyncope.   HISTORY OF PRESENT ILLNESS: Mr. 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 He comes from home secondary to complaining of dizziness and lightheadedness. The patient was noted to be hypoxic in the Emergency Room, placed on 2 liters nasal cannula. He was supposed to be discharged once his oxygen saturations improved; however, when they tried to walk him, his saturations remained stable, but he started to feel dizzy and lightheaded again, so he is being admitted under observation. All his labs are normal. He did have a CT of the chest, which was negative for any acute PE. He is on Coumadin as mentioned above, but his INR is low because he has been noncompliant with his Coumadin.   PAST MEDICAL HISTORY:  1. History of deep vein thrombosis and PE status post IVC filter.  2. Metabolic syndrome with hypertension.  3. Diabetes mellitus.  4. Hyperlipidemia.  5. Right knee pain from MCL tear.  PAST SURGICAL HISTORY: IVC filter placement.    ALLERGIES: SEAFOOD, BUT NO KNOWN DRUG ALLERGIES.   CURRENT HOME MEDICATIONS:  1. Warfarin 8 mg p.o. daily.  2. Metformin 500 mg p.o. b.i.d.  3. Hydrochlorothiazide 25 mg p.o. daily.  4. Lipitor unknown dose.  5. Vitamin D tablet, unknown dose once a week.   SOCIAL HISTORY: Lives at home with his wife. Uses a cane because of his right knee pain. No smoking alcohol or drug use.   FAMILY HISTORY: Father died from unknown cancer, but had history of clotting disorder. Mom had breast cancer.   REVIEW OF SYSTEMS:  CONSTITUTIONAL: No fever, fatigue, or weakness.  EYES: No blurred vision, double  vision, inflammation or glaucoma.  EARS, NOSE AND THROAT: No tinnitus, ear pain, hearing loss, epistaxis or discharge.  RESPIRATORY: No cough, wheeze, hemoptysis, or chronic obstructive pulmonary disease.  CARDIOVASCULAR: No chest pain, orthopnea, edema, arrhythmia, palpitations. Positive for  dizziness and presyncope.  GASTROINTESTINAL: No nausea, vomiting, diarrhea, abdominal pain, hematemesis, or melena.  GENITOURINARY: No dysuria, hematuria, renal calculus, frequency, or incontinence.  ENDOCRINE: No polyuria, nocturia, thyroid problems, heat or cold intolerance.  HEMATOLOGY: No anemia, easy bruising or bleeding.  SKIN: No acne, rash or lesions.  MUSCULOSKELETAL: Positive for arthritis and also right knee pain. No gout.  NEUROLOGIC: No numbness, weakness, CVA, transient ischemic attack, or seizures.  PSYCHOLOGICAL: No anxiety, insomnia, or depression.   PHYSICAL EXAMINATION:  VITAL SIGNS: Temperature 98.2 degrees Fahrenheit, pulse 120, respirations 22, blood pressure 142/92, pulse of 99% on room air.  GENERAL: Heavily built, well-nourished male, sitting in bed, not in any acute distress.  HEENT: Normocephalic, atraumatic. Pupils equal, round, reacting to light. Anicteric sclerae. Extraocular movements intact. Oropharynx clear without erythema, mass, or exudates.  NECK: Supple. No thyromegaly, JVD or carotid bruits. No lymphadenopathy. Normal range of motion without pain.  LUNGS: Clear to auscultation bilaterally. No wheeze or crackles. No use of accessory muscles for breathing.  CARDIOVASCULAR: S1, S2, regular rate and rhythm. No murmurs, rubs, or gallops.  ABDOMEN: Soft, obese, nontender, nondistended. No hepatosplenomegaly. Normal bowel sounds.  EXTREMITIES: 1 to  2+ pitting edema of both lower extremities with dark hyperpigmentation of lower front  and 2/3 of both legs, which appears chronic, but he does have good dorsalis pedis pulses palpable bilaterally.  SKIN: No acne, rash or lesions,  other than the chronic lower extremity skin changes.   LYMPHATICS: No cervical lymphadenopathy.  NEUROLOGIC: Cranial nerves intact. No focal motor or sensory deficits.   LABORATORY DATA: Urinalysis is negative for any infection.   Lactic acid is within normal limits. CK is 263, CK-MB 1.5. Troponin less than 0.03.  INR is 1.0.   WBC 5.9, hemoglobin 16.0, hematocrit 48.2, platelet count 239.   Sodium 138, potassium 3.1, chloride 106, bicarbonate 22, BUN 11, creatinine 1.1, glucose 152 and calcium of 8.7.   ALT 34, AST 28, alk phos 57, total bilirubin 0.6, and albumin of 4.2.   Chest x-ray showing clear lung fields, no acute cardiopulmonary disease. CT of the chest with contrast showing no evidence of pulmonary embolism. No acute findings in chest. CT of the abdomen and pelvis shows no acute findings. Hepatic steatosis.     EKG sinus tachycardia, heart rate of 120. No acute ST-T wave abnormalities.   ASSESSMENT AND PLAN: A 44 year old male with obesity, history of pulmonary embolus on Coumadin, noncompliant, status post inferior vena cava filter, hypertension, diabetes, metabolic syndrome, comes for chest pain, dyspnea and presyncope.   1. Dyspnea, hypoxia, and presyncope. Likely has obesity, hypoventilation syndrome sleep apnea, similar episode in the past, was weaned off oxygen then. Now on 2 liters in the Emergency Room. We were able to wean him off the O2, but on ambulation, he was dizzy again. So, we will admit under observation, do orthostatic vitals and also echocardiogram and carotid Dopplers. A CT of the chest is negative for pulmonary embolus. Electrocardiogram is normal. Troponins are negative. He will need sleep study as an outpatient.  2. Diabetes mellitus, on metformin. It will be held because the patient just had CT with contrast today.  3. Hypertension. The patient is on HCTZ. Will continue.  4. Hyperlipidemia. The patient on statin. Will need to verify and do Lipitor.   5. History of pulmonary embolism. No new pulmonary embolism on CT chest today. INR is low,  noncompliant. Will start warfarin and recheck in the morning.   CODE STATUS: Full code.   TOTAL TIME SPENT ADMISSION: 50 minutes.   ____________________________ Gladstone Lighter, MD rk:AT D: 12/20/2014 13:29:52 ET T: 12/20/2014 14:57:27 ET JOB#: [id number]  cc: Gladstone Lighter, MD, <Dictator> Isla Pence, MD Gladstone Lighter MD ELECTRONICALLY SIGNED 12/23/2014 15:14

## 2014-12-29 NOTE — Discharge Summary (Signed)
PATIENT NAME:  Ryan [name], Ryan [name] MR#:  [id number] DATE OF BIRTH:  08/11/1971  DATE OF ADMISSION:  12/20/2014 DATE OF DISCHARGE:  12/21/2014  ADMITTING PHYSICIAN: Enid Baas, MD  DISCHARGING PHYSICIAN: Enid Baas, MD   PRIMARY CARE PHYSICIAN: Marlyn Corporal, MD   CONSULTATIONS IN THE HOSPITAL: None.   DISCHARGE DIAGNOSES:  1.  Vasovagal presyncope.  2.  Hematuria.  3.  Obesity.  4.  Hypertension.  5.  Diabetes mellitus.  6.  Hyperlipidemia/metabolic syndrome.  7.  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   DISCHARGE HOME MEDICATIONS:  1.  Metformin 500 mg p.o. twice a day; advised to start taking from 12/23/2014 due to contrast exposure in the hospital.  2.  HCTZ 12.5 mg p.o. daily.  3.  Metoprolol 25 mg p.o. b.i.d.  4.  Colace 100 mg p.o. b.i.d.; advised to hold if greater than 2 bowel movements per day.  5.  Tramadol 50 mg q. 8 hours p.r.n. for pain.  6.  Simethicone 160 mg q. 8 hours p.r.n. for bloating or gas.  7.  MiraLax powder p.r.n. for constipation daily.   DISCHARGE DIET: Low-sodium, low-fat and ADA 1800 calorie diet.   DISCHARGE ACTIVITY: As tolerated.    FOLLOWUP INSTRUCTIONS:  1.  PCP follow-up in 1 week. The patient might need an outpatient sleep study.  2.  Urology followup in 1 week with Dr. Vanna Scotland for hematuria.  3.  Coumadin can be restarted after urology followup.   LABORATORY AND IMAGING STUDIES PRIOR TO DISCHARGE:  1.  WBC 5.6, hemoglobin 14.3, hematocrit 43.2, platelet count 168,000.  2.  Sodium 138, potassium 3.7, chloride 106, bicarbonate 22, BUN 11, creatinine 1.1, glucose 152, and calcium of 8.7.  3.  ALT 34, AST 28, alkaline phosphatase 57, total bilirubin 0.6, and albumin of 4.2.  4.  Urinalysis negative for any infection.  5.  INR was 1.8 on admission.  6.  Troponins have been negative.  7.  Lactic acid negative.  8.  Blood cultures negative.  9.  Chest x-ray was normal.  10.  Urinalysis no  infection, but 2+ blood, with numerous RBCs.  11.  CT of the chest with contrast showing no evidence of PE or other acute findings in the chest.  12.  CT of the abdomen and pelvis without contrast did not show any acute findings. Kidneys, ureters, bladder were unremarkable. Hepatic steatosis is present.   13.  Echocardiogram showing normal LV ejection fraction, EF of 55%-60%, impaired relaxation pattern of LV diastolic filling noted.  14.  Ultrasound of the carotids bilaterally showing no hemodynamically significant stenosis.   BRIEF HOSPITAL COURSE: Mr. Tallman is a 44 year old obese male with metabolic syndrome with hypertension, diabetes, hyperlipidemia, and history of PE who has been on but noncompliant, also has an IVC filter for the same. He presents to the hospital secondary to a presyncopal episode.  1.  Presyncope: Initially admitted under observation. The patient's labs were completely normal. Echocardiogram is normal. Ultrasound is normal. No arrhythmias on telemetry. It seems vasovagal because the most of his symptoms with dizziness, lightheaded were happening with straining with constipation. The patient has been ambulating fine without any symptoms, so he is being discharged home. He might benefit from having an outpatient sleep study. He started on metoprolol low-dose for high-normal heart rate.  2.  Hypertension: HCTZ dose is being reduced, as he was dehydrated on presentation and he is started on metoprolol low-dose.  3.  Diabetes:  Metformin was advised to hold for another day, as he did get CT with contrast on admission.  4.  Hyperlipidemia: Continue his Lipitor.  5.  History of PE: The patient is supposed to be on Coumadin for several years, not taking his Coumadin anyways. His INR was 1.8 on admission. He does have an IVC filter likely from his noncompliance and his most recent CT this admission did not show any PE. His Coumadin is currently held because of his frank hematuria on admission  and can be restarted after he follows up with urology.  6.  Hematuria on admission: No trauma or catheter insertion this admission. It started clearing up. He is having dark brown urine. Discussed with urologist on-call; they recommended postvoiding bladder scan, which did not show any significant retention of urine, so recommended outpatient followup. The patient is off Coumadin at this time and outpatient urology followup is recommended. His hemoglobin is stable. His course has been otherwise uneventful in the hospital.   DISCHARGE CONDITION: Stable.   DISCHARGE DISPOSITION: Home.   TIME SPENT ON DISCHARGE: 40 minutes.     ____________________________ Enid Baasadhika Roanna Reaves, MD rk:bm D: 12/21/2014 11:42:39 ET T: 12/21/2014 23:31:54 ET JOB#: [id number]  cc: Enid Baasadhika Gurbani Figge, MD, <Dictator> Claris GladdenAshley J. Brandon, MD Marlyn CorporalFayegh H. Jadali, MD Enid BaasADHIKA Braxten Memmer MD ELECTRONICALLY SIGNED 12/23/2014 15:14

## 2014-12-29 NOTE — Consult Note (Signed)
[facility] CONSULT NOTE    Date: 12/29/2014                  Patient Name:  Ryan [name]  MRN: [id number]  DOB: 23-May-1971  Age / Sex: 44 y.o., male         PCP: No primary care provider on file.                 Service Requesting Consult: Dr. Durward Mallard Medicine                 Reason for Consult: Acute renal failure            History of Present Illness: Patient is a 44 y.o. 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  As above he has known history of genetic clotting disorder and developed acute pain in the right lower extremity  This admission he has undergone extensive thrombolysis as well as IVC filter placement.  He was exposed to contrast dye as part of this procedure and also had exposure to contrast on 12/24/14 for a CT scan of the abdomen and pelvis.  He's also had poor appetite.  His Cr has progressively worsened over the course of the hospitalization.  It is currently up to 7.2 and pt is developing decreased urine outpt.     Medications: Outpatient medications: Prescriptions prior to admission  Medication Sig Dispense Refill Last Dose  . docusate sodium (COLACE) 100 MG capsule Take 100 mg by mouth 2 (two) times daily as needed for mild constipation.     . hydrochlorothiazide (HYDRODIURIL) 12.5 MG tablet Take 12.5 mg by mouth daily.     . metFORMIN (GLUCOPHAGE) 500 MG tablet Take 500 mg by mouth 2 (two) times daily with a meal.     . metoprolol tartrate (LOPRESSOR) 25 MG tablet Take 25 mg by mouth 2 (two) times daily.     . simethicone (MYLICON) 80 MG chewable tablet Chew 80 mg by mouth 4 (four) times daily as needed for flatulence.     . traMADol (ULTRAM) 50 MG tablet Take by mouth 3 (three) times daily as needed for  moderate pain.       Current medications: Current Facility-Administered Medications  Medication Dose Route Frequency Provider Last Rate Last Dose  . 0.9 %  sodium chloride infusion   Intravenous Continuous Doctor Chlconversion, MD      . acetaminophen (TYLENOL) tablet 650 mg  650 mg Oral Q4H PRN Doctor Chlconversion, MD      . alum & mag hydroxide-simeth (MAALOX/MYLANTA) 200-200-20 MG/5ML suspension 30 mL  30 mL Oral Q4H PRN Doctor Chlconversion, MD      . docusate sodium (COLACE) capsule 100 mg  100 mg Oral BID PRN Doctor Chlconversion, MD      . heparin ADULT infusion 100 units/mL (25000 units/250 mL)  2,100 Units/hr Intravenous Continuous Jody C Barefoot, RPH 21 mL/hr at 12/29/14 1055 2,100 Units/hr at 12/29/14 1055  . insulin aspart (novoLOG) injection 0-10 Units  0-10 Units Subcutaneous TID AC & HS Arnaldo Natal, MD   0 Units at 12/29/14 [phone]  . metoCLOPramide (REGLAN) injection 5 mg  5 mg Intravenous Q6H PRN Doctor Chlconversion, MD      . metoprolol tartrate (LOPRESSOR) tablet 25  mg  25 mg Oral BID Doctor Chlconversion, MD   25 mg at 12/29/14 0947  . morphine 2 MG/ML injection 2 mg  2 mg Intravenous Q4H PRN Doctor Chlconversion, MD      . ondansetron (ZOFRAN) injection 4 mg  4 mg Intravenous Q4H PRN Doctor Chlconversion, MD      . ondansetron [facility]) injection 4 mg  4 mg Intravenous 6 times per day Doctor Chlconversion, MD   4 mg at 12/29/14 1615  . pantoprazole (PROTONIX) injection 40 mg  40 mg Intravenous Q12H Doctor Chlconversion, MD   40 mg at 12/29/14 0949  . simethicone (MYLICON) chewable tablet 80 mg  80 mg Oral Q8H PRN Doctor Chlconversion, MD      . 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  Family History: Father deceased from pancreatic cancer, Mother deceased from breast cancer.  Social History: Married, 1 child, works for [employer].  Denies tobacco, ETOH, or illicits.   Review of Systems: Review of Systems  Constitutional: Positive for malaise/fatigue. Negative for fever, chills, weight loss and diaphoresis.  HENT: Negative for congestion, ear pain, hearing loss, nosebleeds, sore throat and tinnitus.   Eyes: Negative for blurred vision, double vision and photophobia.  Respiratory: Negative for cough, hemoptysis and sputum production.   Cardiovascular: Negative for chest pain, palpitations and orthopnea.  Gastrointestinal: Positive for nausea. Negative for heartburn, vomiting, abdominal pain and diarrhea.  Genitourinary: Negative.   Musculoskeletal: Negative for myalgias, back pain and neck pain.  Skin: Negative for itching and rash.  Neurological: Negative for dizziness, tingling, tremors and headaches.  Endo/Heme/Allergies: Negative.  Negative for environmental allergies. Does not bruise/bleed easily.  Psychiatric/Behavioral: Negative for depression and substance abuse. The patient is not nervous/anxious.     Vital Signs: Blood pressure 128/75, pulse 76, temperature 97.9 F (36.6 C), temperature source Oral, resp. rate 20, height 5' 6.97" (1.701 m), weight 148.5 kg (327 lb 6.1 oz), SpO2 100 %.  Weight trends: Filed Weights   12/27/14 1322  Weight: 148.5 kg (327 lb 6.1 oz)    Physical Exam: General: NAD, obese  Head: Normocephalic, atraumatic.  Eyes: Anicteric, EOMI, PEERL  Nose: Mucous membranes moist, not inflammed, nonerythematous.  Throat: Oropharynx nonerythematous, no exudate appreciated.   Neck: No deformities, masses, or tenderness noted.Supple, No carotid Bruits, no JVD.  Lungs:  Normal respiratory effort. Clear to auscultation BL without crackles or wheezes.  Heart: RRR. S1 and S2 normal  without gallop, murmur, or rubs.  Abdomen:  BS normoactive. Soft, Nondistended, non-tender.  No masses or organomegaly.  Extremities: 3+ RLE edema, 2+ LLE edema  Neurologic: A&O X3, CN II - XII are grossly intact. Motor strength is 5/5 in the all 4 extremities, Sensations intact to light touch, Cerebellar signs negative.  Skin: Hyperpigmentation of skin in b/l LE's    Lab results: Basic Metabolic Panel:  Recent Labs Lab 12/28/14 0554 12/28/14 1602 12/29/14 0436  NA 131* 130* 132*  K 4.1 3.8 4.0  CL 95* 94* 97*  CO2 25 24 23   GLUCOSE 130* 121* 127*  BUN 44* 57* 67*  CREATININE 3.95* 5.55* 7.20*  CALCIUM 8.3* 8.1* 8.0*  Liver Function Tests:  Recent Labs Lab 12/23/14 2219  AST 19  ALT 24  ALKPHOS 54  PROT 8.0  ALBUMIN 3.9   No results for input(s): LIPASE, AMYLASE in the last 168 hours. No results for input(s): AMMONIA in the last 168 hours.  CBC:  Recent Labs Lab 12/25/14 0637 12/26/14 0520 12/27/14 0515  12/28/14 0444 12/28/14 1602 12/29/14 0436  WBC 9.1 7.2 6.1  --  11.3*  --  10.2  NEUTROABS 6.1 4.5 3.6  --  7.7*  --   --   HGB 14.0 13.4 12.9*  < > 12.1* 11.7* 10.9*  HCT 41.8 40.1 38.9*  --  34.9*  --  32.3*  MCV 90 90 90  --  90  --  90.5  PLT 190 239 247  --  224  --  262  < > = values in this interval not displayed.   CBG:  Recent Labs Lab 12/29/14 0724 12/29/14 1143 12/29/14 1614  GLUCAP 115* 112* 104*    Microbiology: Results for orders placed or performed during the hospital encounter of 12/28/14  Culture, blood (single)     Status: None (Preliminary result)   Collection Time: 12/27/14  8:34 PM  Result Value Ref Range Status   Micro Text Report   Preliminary       COMMENT                   NO GROWTH IN 36 HOURS   ANTIBIOTIC                                                      Culture, blood (single)     Status: None (Preliminary result)   Collection Time: 12/27/14  8:40 PM  Result Value Ref Range Status   Micro Text Report    Preliminary       COMMENT                   NO GROWTH IN 36 HOURS   ANTIBIOTIC                                                         Imaging: CT scan abd/pelvis: IMPRESSION: 1. 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 Soft tissue edema extends more superficially. Occlusion of the left common femoral vein, though the left profunda femoral vein and superficial femoral vein appear to demonstrate some flow. 2. Complete thrombosis extends along the common iliac veins bilaterally, and along the inferior vena cava, to approximately 3.7 cm above the level of the IVC filter. This resolves just below the level of the renal veins. Diffuse associated soft tissue inflammation along the distended thrombosed veins, compatible with acute thrombophlebitis. 3. Mild soft tissue inflammation about the bladder could reflect cystitis. Would correlate clinically for associated symptoms. 4. Enlarged bilateral inguinal nodes, measuring up to 2.5 cm on the right, may reflect thrombophlebitis, though biopsy could be considered as deemed clinically appropriate, as these were also seen on the recent prior study, when thrombophlebitis was less prominent. 5. Scattered diverticulosis along the sigmoid  colon, without evidence of diverticulitis.  Assessment & Plan: Pt is a 44 y.o. 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   1.  Acute renal failure unspec.  Multiple bouts of contrast exposure this admission. 2.  Hypertension. 3.  Hypercholesterolemia.  Plan:  Pt has had multiple contrast exposures this hospitalization. Will need to monitor for persistent oliguria.  Will proceed with renal US to evaluate  for potential of renal vein thrombosis.  No urgent indication for HD.  Would like to avoid catheter placement for HD given hypercoaguable history.  However if oliguria persists may need to consider this.  Avoid further contrast exposure if possible, also avoid NSAIDs and any other nephrotoxins at this point in time.  Will monitor along with you, thanks for consult.

## 2014-12-29 NOTE — Progress Notes (Signed)
ANTICOAGULATION CONSULT NOTE - Follow Up Consult  Pharmacy Consult for Heparin Indication: DVT  No Known Allergies  Patient Measurements: Height: 5' 6.97" (170.1 cm) Weight: (!) 327 lb 6.1 oz (148.5 kg) IBW/kg (Calculated) : 66.03 Heparin Dosing Weight: 99kg  Labs:  Recent Labs  12/27/14 0515  12/28/14 0444 12/28/14 0554 12/28/14 0810 12/28/14 1541 12/28/14 1602 12/29/14 0436  HGB 12.9*  < > 12.1*  --   --   --  11.7* 10.9*  HCT 38.9*  --  34.9*  --   --   --   --  32.3*  PLT 247  --  224  --   --   --   --  262  APTT  --   --   --   --   --  75.2*  --  116*  HEPARINUNFRC 0.38  --   --  < 0.10* 0.22*  --   --  0.78*  CREATININE 1.52*  --   --  3.95*  --   --  5.55*  --   < > = values in this interval not displayed.  Estimated Creatinine Clearance: 23.5 mL/min (by C-G formula based on Cr of 5.55).   Medications:  Scheduled:  . insulin aspart  0-10 Units Subcutaneous TID AC & HS  . metoprolol tartrate  25 mg Oral BID  . ondansetron (ZOFRAN) IV  4 mg Intravenous 6 times per day  . pantoprazole (PROTONIX) IV  40 mg Intravenous Q12H  . potassium chloride  10 mEq Oral Daily   Infusions:  . sodium chloride    . heparin      Assessment: Supraptherapeutic   Goal of Therapy:  Heparin level 0.3-0.7 units/ml Monitor platelets by anticoagulation protocol: Yes   Plan:  Will decrease rate to 2100 units/hr and recheck HL at 14:00  Garlon HatchetJody Ozie Lupe, PharmD 12/29/2014 5:47 AM

## 2014-12-30 ENCOUNTER — Inpatient Hospital Stay: Payer: 59

## 2014-12-30 ENCOUNTER — Encounter: Payer: Self-pay | Admitting: Radiology

## 2014-12-30 LAB — BASIC METABOLIC PANEL
Anion gap: 12 (ref 5–15)
BUN: 78 mg/dL — AB (ref 6–20)
CALCIUM: 7.7 mg/dL — AB (ref 8.9–10.3)
CHLORIDE: 97 mmol/L — AB (ref 101–111)
CO2: 21 mmol/L — AB (ref 22–32)
Creatinine, Ser: 9.29 mg/dL — ABNORMAL HIGH (ref 0.61–1.24)
GFR calc Af Amer: 7 mL/min — ABNORMAL LOW (ref >60)
GFR calc non Af Amer: 6 mL/min — ABNORMAL LOW (ref >60)
GLUCOSE: 101 mg/dL — AB (ref 65–99)
Potassium: 3.9 mmol/L (ref 3.5–5.1)
Sodium: 130 mmol/L — ABNORMAL LOW (ref 135–145)

## 2014-12-30 LAB — CBC
HCT: 27.9 % — ABNORMAL LOW (ref 40.0–52.0)
HCT: 28.5 % — ABNORMAL LOW (ref 40.0–52.0)
Hemoglobin: 9.4 g/dL — ABNORMAL LOW (ref 13.0–18.0)
Hemoglobin: 9.5 g/dL — ABNORMAL LOW (ref 13.0–18.0)
MCH: 30.1 pg (ref 26.0–34.0)
MCH: 30.3 pg (ref 26.0–34.0)
MCHC: 33.5 g/dL (ref 32.0–36.0)
MCHC: 33.7 g/dL (ref 32.0–36.0)
MCV: 89.9 fL (ref 80.0–100.0)
MCV: 90 fL (ref 80.0–100.0)
Platelets: 288 10*3/uL (ref 150–440)
Platelets: 295 10*3/uL (ref 150–440)
RBC: 3.1 MIL/uL — ABNORMAL LOW (ref 4.40–5.90)
RBC: 3.17 MIL/uL — ABNORMAL LOW (ref 4.40–5.90)
RDW: 13.8 % (ref 11.5–14.5)
RDW: 14.2 % (ref 11.5–14.5)
WBC: 7.8 10*3/uL (ref 3.8–10.6)
WBC: 8.5 10*3/uL (ref 3.8–10.6)

## 2014-12-30 LAB — GLUCOSE, CAPILLARY
Glucose-Capillary: 104 mg/dL — ABNORMAL HIGH (ref 70–99)
Glucose-Capillary: 100 mg/dL — ABNORMAL HIGH (ref 70–99)
Glucose-Capillary: 93 mg/dL (ref 70–99)
Glucose-Capillary: 103 mg/dL — ABNORMAL HIGH (ref 70–99)

## 2014-12-30 LAB — HEPARIN LEVEL (UNFRACTIONATED)
Heparin Unfractionated: 0.51 IU/mL (ref 0.30–0.70)
Heparin Unfractionated: 0.56 IU/mL (ref 0.30–0.70)

## 2014-12-30 NOTE — Progress Notes (Signed)
Physical Therapy Treatment Patient Details Name: Ryan [name] MRN: [id number] DOB: 1970-09-29 Today's Date: 12/30/2014    History of Present Illness 44 yo Male came to ED with acute DVT, edema and shortness of breath. Patient was started on Heparin drip  and on 4/28 underwent mechanical thrombectomy R/L venous system and IVC filter placement. Pateint has a PMH significant for right MCL tear. He also reports dizziness with positional change.    PT Comments    Pt in bed and willing to participate in therapy.  Mod I supine to sit at EOB, checked BP in sitting 122/73, no c/o dizziness, however pt indicated significant increase in RLE tightness in RLE near groin. Pt indicated increased difficulty moving LE today vs yesterday. With assistance of LE pt able to return to bed and perform supine therex as noted above.  Pt demostrated good bed mobility able to self boost in bed however requiring add'l time due to fatigue. He would continue to benefit from skilled PT to improve transfers, ambulation, and increased functional mobility.   Follow Up Recommendations  Home health PT     Equipment Recommendations  Other (comment)    Recommendations for Other Services       Precautions / Restrictions Restrictions Weight Bearing Restrictions: No Other Position/Activity Restrictions: FWB    Mobility  Bed Mobility Overal bed mobility: Needs Assistance;Modified Independent Bed Mobility: Supine to Sit;Sit to Supine     Supine to sit: Modified independent (Device/Increase time) Sit to supine: Min assist   General bed mobility comments: Mod I supine to sit, required min a x 1 for leg positioning sit to supine  Transfers                    Ambulation/Gait                 Stairs            Wheelchair Mobility    Modified Rankin (Stroke Patients Only)       Balance                                    Cognition Arousal/Alertness: Awake/alert Behavior  During Therapy: WFL for tasks assessed/performed Overall Cognitive Status: Within Functional Limits for tasks assessed                      Exercises General Exercises - Lower Extremity Ankle Circles/Pumps: Both;20 reps;Supine Short Arc Quad: Both;15 reps;Supine Heel Slides: AAROM;Both;15 reps Hip ABduction/ADduction: AROM;Both;15 reps    General Comments        Pertinent Vitals/Pain Pain Assessment: 0-10 Pain Score: 2  Pain Location: R LEG Pain Descriptors / Indicators: Tightness;Squeezing;Heaviness Pain Intervention(s): Monitored during session;Limited activity within patient's tolerance    Home Living                      Prior Function            PT Goals (current goals can now be found in the care plan section) Acute Rehab PT Goals Patient Stated Goal: to go home PT Goal Formulation: With patient Time For Goal Achievement: 01/19/15 Potential to Achieve Goals: Good Progress towards PT goals: Progressing toward goals    Frequency  Min 2X/week    PT Plan Current plan remains appropriate    Co-evaluation  End of Session   Activity Tolerance: Patient limited by pain Patient left: in bed;with call bell/phone within reach;with bed alarm set     Time: 1437-1510 PT Time Calculation (min) (ACUTE ONLY): 33 min  Charges:  $Therapeutic Exercise: 8-22 mins $Therapeutic Activity: 8-22 mins                    G Codes:      Kimmberly Wisser 12/30/2014, 3:28 PM

## 2014-12-30 NOTE — Consult Note (Signed)
Present Illness This is a 44 year old African American male who presented to the Emergency Department via EMS complaining of pain and swelling in his right lower extremity. The patient has been having difficulty walking as well as experiencing some dizziness and lightheadedness. He presents today with a swollen right lower extremity and was found to have an extensive DVT of the left and right femoral and iliac venous system. He denies any chest pain but admits to shortness of breath, which has been an ongoing problem for the last few weeks. He also complains of an occasional cough. The patient is aware that he has a clotting disorder but cannot remember the name. He sees hematology at [facility]. Due to his extensive clot burden, I am asked to evaluate for thrombectomy.  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    SURGICAL HISTORY: IVC filter placement.   Home Medications: Medication Instructions Status  metFORMIN 500 mg oral tablet 1 tab(s) orally 2 times a day Active  docusate sodium 100 mg oral capsule 1 cap(s) orally 2 times a day, As Needed - for Constipation Active  traMADol 50 mg oral tablet 1 tab(s) orally every 8 hours, As Needed - for Pain Active  metoprolol tartrate 25 mg oral tablet 1 tab(s) orally 2 times a day Active  hydrochlorothiazide 25 mg oral tablet 0.5 tab(s) orally once a day (at bedtime) Active  Gas-X 80 mg oral tablet, chewable 1 tab(s) orally 4 times a day, As Needed for gas.  Active    No Known Allergies:   Case History:  Family History No autoimmune diseases no porphyria   Social History negative tobacco, negative ETOH, negative Illicit drugs   Review of Systems:  ROS No TIA/stroke/seizure No heat or cold intolerance No dysuria/hematuria No blurry or double vision No tinnitus or ear pain No rashes or ulcer No  suicidal ideation or psychosis No signs of bleeding or easy bruising No SOB/DOE, orthopnea, or sputum No palpitations or chest pain No N/V/D or abdominal pain No joint pain or joint swelling No fever or chills No unintentional weight loss or gain   Physical Exam:  GEN well developed, obese, moderate distress   HEENT pink conjunctivae, hearing intact to voice, moist oral mucosa   NECK supple  trachea midline   RESP normal resp effort  no use of accessory muscles   CARD regular rate  no JVD   ABD denies tenderness  obese nondistended   EXTR positive edema, severe venous stassis changes of both ankles  edema is 3+   SKIN positive rashes, No ulcers   NEURO cranial nerves intact, follows commands, motor/sensory function intact   PSYCH alert, A+O to time, place, person   Nursing/Ancillary Notes: **Vital Signs.:   26-Apr-16 03:46  Vital Signs Type Admission  Temperature Temperature (F) 98.8  Celsius 37.1  Pulse Pulse 113  Respirations Respirations 20  Systolic BP Systolic BP 470  Diastolic BP (mmHg) Diastolic BP (mmHg) 84  Mean BP 98  Pulse Ox % Pulse Ox % 92  Pulse Ox Activity Level  At rest  Oxygen Delivery Room Air/ 21 %   LabObservation:  25-Apr-16 23:45   OBSERVATION Reason for Test RLE pain, swelling. recent hospitalzation 3 days ago  Hepatic:  25-Apr-16 22:19   Bilirubin, Total  1.3 (0.3-1.2 NOTE: New Reference Range  11/05/14)  Alkaline Phosphatase 54 (38-126 NOTE: New  Reference Range  11/05/14)  SGPT (ALT) 24 (17-63 NOTE: New Reference Range  11/05/14)  SGOT (AST) 19 (15-41 NOTE: New Reference Range  11/05/14)  Total Protein, Serum 8.0 (6.5-8.1 NOTE: New Reference Range  11/05/14)  Albumin, Serum 3.9 (3.5-5.0 NOTE: New reference range  11/05/14)  General Ref:  26-Apr-16 03:21   Protein C and S Panel ========== TEST NAME ==========  ========= RESULTS =========  = REFERENCE RANGE =  PROTEIN C AND S PANEL  PrtCAg+PrtSAg Protein C Antigen                [   82 %                 ]            70-140 Protein S, Total                [   137 %         ]            58-150 Protein S, Free                 [   60 %                 ]            [address]               [facility]            No: [id number]           [address] [place], [place] [address]           Lindon Romp, MD         [phone]   Result(s) reported on 28 Dec 2014 at 05:51AM.  Routine Chem:  25-Apr-16 22:19   Glucose, Serum  127 (65-99 NOTE: New Reference Range  11/05/14)  BUN 14 (6-20 NOTE: New Reference Range  11/05/14)  Creatinine (comp) 0.99 (0.61-1.24 NOTE: New Reference Range  11/05/14)  Sodium, Serum  129 (135-145 NOTE: New Reference Range  11/05/14)  Potassium, Serum 3.7 (3.5-5.1 NOTE: New Reference Range  11/05/14)  Chloride, Serum  93 (101-111 NOTE: New Reference Range  11/05/14)  CO2, Serum 25 (22-32 NOTE: New Reference Range  11/05/14)  Calcium (Total), Serum  8.5 (8.9-10.3 NOTE: New Reference Range  11/05/14)  Anion Gap 11  eGFR (African American) >60  eGFR (Non-African American) >60 (eGFR values <83m/min/1.73 m2 may be an indication of chronic kidney disease (CKD). Calculated eGFR is useful in patients with stable renal function. The eGFR calculation will not be reliable in acutely ill patients when serum creatinine is changing rapidly. It is not useful in patients on dialysis. The eGFR calculation may not be applicable to patients at the low and high extremes of body sizes, pregnant women, and vegetarians.)  Lipase 24 (22-51 NOTE: New Reference Range  11/05/14)  Routine UA:  26-Apr-16 00:58   Color (UA) Yellow  Clarity (UA) Clear  Glucose (UA) Negative  Bilirubin (UA) Negative  Ketones (UA) Negative  Specific Gravity (UA) >1.060  Blood (UA) 3+  pH (UA) 5.0  Protein (UA) 100 mg/dL  Nitrite (UA) Negative  Leukocyte Esterase (UA) Negative (Result(s) reported on 24 Dec 2014 at 01:27AM.)  RBC (UA) TNTC   WBC (UA) 0-5  Bacteria (UA) RARE  Epithelial Cells (UA) 0-5  Mucous (UA) PRESENT (Result(s) reported on 24 Dec 2014 at 01:27AM.)  Routine Coag:  25-Apr-16  22:19   Prothrombin  15.1 (11.4-15.0 NOTE: New Reference Range  09/27/14)  INR 1.2 (INR reference interval applies to patients on anticoagulant therapy. A single INR therapeutic range for coumarins is not optimal for all indications; however, the suggested range for most indications is 2.0 - 3.0. Exceptions to the INR Reference Range may include: Prosthetic heart valves, acute myocardial infarction, prevention of myocardial infarction, and combinations of aspirin and anticoagulant. The need for a higher or lower target INR must be assessed individually. Reference: The Pharmacology and Management of the Vitamin K  antagonists: the seventh ACCP Conference on Antithrombotic and Thrombolytic Therapy. WOEHO.1224 Sept:126 (3suppl): [id number]. A HCT value >55% may artifactually increase the PT.  In one study,  the increase was an average of 25%. Reference:  "Effect on Routine and Special Coagulation Testing Values of Citrate Anticoagulant Adjustment in Patients with High HCT Values." American Journal of Clinical Pathology 2006;126:400-405.)  26-Apr-16 22:19   Activated PTT (APTT) 29.6 (A HCT value >55% may artifactually increase the APTT. In one study, the increase was an average of 19%. Reference: "Effect on Routine and Special Coagulation Testing Values of Citrate Anticoagulant Adjustment in Patients with High HCT Values." American Journal of Clinical Pathology 2006;126:400-405.)  Routine Hem:  25-Apr-16 22:19   WBC (CBC) 9.4  RBC (CBC) 4.77  Hemoglobin (CBC) 14.1  Hematocrit (CBC) 42.8  Platelet Count (CBC)  124 (Result(s) reported on 23 Dec 2014 at 10:51PM.)  MCV 90  MCH 29.7  MCHC 33.0  RDW 13.8  26-Apr-16 08:00   WBC (CBC)  10.7  RBC (CBC) 4.84  Hemoglobin (CBC) 14.6  Hematocrit (CBC) 43.3  Platelet Count (CBC)  136   MCV 90  MCH 30.2  MCHC 33.7  RDW 14.1  Neutrophil % 71.4  Lymphocyte % 14.7  Monocyte % 13.0  Eosinophil % 0.4  Basophil % 0.5  Neutrophil #  7.7  Lymphocyte # 1.6  Monocyte #  1.4  Eosinophil # 0.0  Basophil # 0.1 (Result(s) reported on 24 Dec 2014 at 08:27AM.)    18:49   Hemoglobin (CBC) 14.4 (Result(s) reported on 24 Dec 2014 at 07:18PM.)   [place]:    25-Apr-16 23:45, [place] Color Flow Doppler Lower Extrem Right (Leg)  [place] Color Flow Doppler Lower Extrem Right (Leg)   REASON FOR EXAM:    RLE pain, swelling. recent hospitalzation 3 days ago  COMMENTS:       PROCEDURE: [place]  - US DOPPLER LOW EXTR RIGHT  - Dec 23 2014 11:45PM     CLINICAL DATA:  Acute onset of right leg pain and swelling. Initial  encounter.    EXAM:  RIGHT LOWER EXTREMITY VENOUS DOPPLER ULTRASOUND    TECHNIQUE:  Gray-scale sonography with graded compression, as well as color  Doppler and duplex ultrasound were performed to evaluate the lower  extremity deep venous systems from the level of the common femoral  vein and including the common femoral, femoral, profunda femoral,  popliteal and calf veins including the posterior tibial, peroneal  and gastrocnemius veins when visible. The superficial great  saphenous vein was also interrogated. Spectral Doppler was utilized  to evaluate flow at rest and with distal augmentation maneuvers in  the common femoral, femoral and popliteal veins.    COMPARISON:  None.    FINDINGS:  Contralateral Common Femoral Vein: Occlusive thrombus is noted  filling the contralateral common femoral vein.    Common Femoral Vein: Occlusive thrombus is noted filling the common  femoral vein.  Saphenofemoral Junction:  Occlusive thrombus is noted filling the  saphenofemoral junction.    Profunda Femoral Vein: Occlusive thrombus is noted within the  profunda femoral vein.    Femoral Vein: Occlusive thrombus is noted within the femoral vein.    Popliteal Vein: Mostly occlusive  thrombus is noted within the  popliteal vein. A tiny amount of flow is noted within the popliteal  vein, though the vein is noncompressible.    Calf Veins: No evidence of thrombus. The posterior tibial vein is  visualized; remaining calf veins are not seen. Normal  compressibility and flow on color Doppler imaging.  Superficial Great Saphenous Vein: Occlusive thrombus is noted within  the great saphenous vein.    Venous Reflux:  None.    Other Findings:  None.     IMPRESSION:  There is occlusive thrombus filling nearly all of the visualized  veins of the right leg. There is also occlusive thrombus within the  contralateral common femoral vein; the remainder of the left leg is  not assessed on this study.    These results were called by telephone at the time of interpretation  on 12/24/2014 at 12:38 am to Dr. Cinda Quest, who verbally acknowledged  these results.      Electronically Signed    By: Garald Balding M.D.    On: 12/24/2014 01:03         Verified By: JEFFREY . CHANG, M.D.,  CT:    26-Apr-16 00:07, CT Abdomen and Pelvis With Contrast  CT Abdomen and Pelvis With Contrast   REASON FOR EXAM:    (1) rlq pain, tender; (2) rlq pain, tender  COMMENTS:   May transport without cardiac monitor    PROCEDURE: CT  - CT ABDOMEN / PELVIS  W  - Dec 24 2014 12:07AM     CLINICAL DATA:  Acute onset of right lower quadrant abdominal pain  and right leg pain and swelling for 4 days. Initial encounter.    EXAM:  CT ABDOMEN AND PELVIS WITH CONTRAST    TECHNIQUE:  Multidetector CT imaging of the abdomen and pelvis was performed  using the standard protocol following bolus administration of  intravenous contrast.  CONTRAST:  125 mL of Omnipaque 300 IV contrast    COMPARISON:  CT of the abdomen and pelvis from 12/20/2014    FINDINGS:  The visualized lung bases are clear.    There is complete occlusion of the visualized right common femoral  vein and its more inferior branches, as  well as the left common  femoral vein, though the left profunda femoral vein and superficial  femoral vein appear to demonstrate some flow. Diffuse soft tissue  edema is noted surrounding the vasculature at the right thigh, with  more superficial soft tissue edema also seen.    Complete thrombosis extends along the common iliac veins  bilaterally, and along the inferior vena cava, to approximately 3.7  cm above the level of the IVC filter. This resolves just below the  level of the renal veins. There is diffuse soft tissue inflammation  along the distended thrombosed venous vasculature, compatible with  acute thrombophlebitis.    The liver and spleen are unremarkable in appearance. The gallbladder  is within normal limits. The pancreas and adrenal glands are  unremarkable.    The kidneys are unremarkable in appearance. There is no evidence of  hydronephrosis. No renal or ureteral stones are seen. No perinephric  stranding is appreciated.    No free fluid is identified. The small bowel  is unremarkable in  appearance. The stomach is within normal limits.    The appendix is normal in caliber, without evidence of appendicitis.  Scattered diverticulosis is noted along the sigmoid colon, without  evidence of diverticulitis.    The bladder is mildly distended. Mild soft tissue inflammation about  the bladder could reflect cystitis. Would correlate clinically for  associated symptoms. The prostate remains normal in size.    Enlarged bilateral inguinal nodes, measuring 2.5 cm in short axis on  the right and 1.9 cm in short axis on the left, may reflect the  thrombophlebitis, though biopsy could be considered as deemed  clinically appropriate, as enlarged nodes were also seen on the  recent prior study, when thrombophlebitis was less prominent.  No acute osseous abnormalities are identified.     IMPRESSION:  1. Complete occlusion of the right common femoral vein, profunda  femoral vein  and superficial femoral vein, with diffuse surrounding  soft tissue edema. Soft tissue edema extends more superficially.  Occlusion of the left common femoral vein, though the left profunda  femoral vein and superficial femoral vein appear to demonstrate some  flow.  2. Complete thrombosis extends alongthe common iliac veins  bilaterally, and along the inferior vena cava, to approximately 3.7  cm above the level of the IVC filter. This resolves just below the  level of the renal veins. Diffuse associated soft tissue  inflammation along the distended thrombosed veins, compatible with  acute thrombophlebitis.  3. Mild soft tissue inflammation about the bladder could reflect  cystitis. Would correlate clinically for associated symptoms.  4. Enlarged bilateral inguinal nodes, measuring up to 2.5 cmon the  right, may reflect thrombophlebitis, though biopsy could be  considered as deemed clinically appropriate, as these were also seen  on the recent prior study, when thrombophlebitis was less prominent.  5. Scattered diverticulosis along the sigmoid colon, without  evidence of diverticulitis.    These results were called by telephone at the time of interpretation  on 12/24/2014 at 12:46 am to Dr. Cinda Quest, who verbally acknowledged  these results.      Electronically Signed    By: Garald Balding M.D.    On: 12/24/2014 01:03         Verified By: JEFFREY . CHANG, M.D.,    Impression 1.  Deep vein thrombosis. This is a very complicated problem since his thrombus is so extensive.  He has occlusive clot extending from the SFV to above the IVC filter bilaterally. The patient reports knowing that he has a clotting disorder but cannot recall the name or type. Given his family history as well as the presence of blood in his urine and obvious clotting are present on admission, he probably has factor V Leiden deficiency. I will check a protein C and S levels. Also, I will check activated protein C  resistance. I have had an extensive discussion with him regarding treatment options.  A total of 85 minutes was spent in patietn care with about 40 minutes of time in discussion with the patient.  He will continue heparin for now.  If he remains stable without evidence of bleeding then he will need a suprarenal IVC filter (the importance of having this removed in 6 weeks was stressed) and then TPA with mechanical thrombectomy will be done.  The importance of compression down the road was stressed.  2.  Hypertension. Continue metoprolol and hydrochlorothiazide.  3.  Hyponatremia. This is possibly secondary to dehydration. We will gently hydrate  the patient with intravenous fluid.  4.  Metabolic syndrome. We will check the patient's hemoglobin A1c.   Plan level 4 consult   Electronic Signatures: Hortencia Pilar (MD)  (Signed 30-Apr-16 15:44)  Authored: General Aspect/Present Illness, Home Medications, Allergies, History and Physical Exam, Vital Signs, Labs, Radiology, Impression/Plan   Last Updated: 30-Apr-16 15:44 by Hortencia Pilar (MD)

## 2014-12-30 NOTE — Progress Notes (Signed)
ANTICOAGULATION CONSULT NOTE - Initial Consult  Pharmacy Consult for Heparin  Indication: DVT  No Known Allergies  Patient Measurements: Height: 5' 6.97" (170.1 cm) Weight: (!) 327 lb 6.1 oz (148.5 kg) IBW/kg (Calculated) : 66.03 Heparin Dosing Weight: 102  Vital Signs: Temp: 98.2 F (36.8 C) (05/01 2129) Temp Source: Oral (05/01 2129) BP: 137/80 mmHg (05/01 2129) Pulse Rate: 89 (05/01 2129)  Labs:  Recent Labs  12/28/14 0444  12/28/14 1541 12/28/14 1602 12/29/14 0436 12/29/14 1538 12/30/14 0009  HGB 12.1*  --   --  11.7* 10.9*  --  9.5*  HCT 34.9*  --   --   --  32.3*  --  28.5*  PLT 224  --   --   --  262  --  295  APTT  --   --  75.2*  --  116*  --   --   HEPARINUNFRC  --   < >  --   --  0.78* 0.55 0.51  CREATININE  --   < >  --  5.55* 7.20*  --  9.29*  < > = values in this interval not displayed.  Estimated Creatinine Clearance: 14.1 mL/min (by C-G formula based on Cr of 9.29).   Medical History: No past medical history on file.  Medications:  Scheduled:  . insulin aspart  0-10 Units Subcutaneous TID AC & HS  . metoprolol tartrate  25 mg Oral BID  . ondansetron (ZOFRAN) IV  4 mg Intravenous 6 times per day  . 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  Garlon HatchetJody Teancum Brule, PharmD 12/30/2014,1:09 AM

## 2014-12-30 NOTE — Plan of Care (Addendum)
Problem: Discharge Progression Outcomes Goal: Other Discharge Outcomes/Goals Individualization Pt likes to be called Ryan [name] Pt admitted on April 22-24th and readmitted with the same medical dx today. Pt has a medical hx of DVT and PE which pt said has been recurrent for numerous years. History of HTN and metabolic syndrome controlled by home meds. Moderate fall risk, no bed alarm activated, hourly roudning performed.  Plan of Care progress to goal for:  Pain-pt reports no pain this shift Hemodynamically-pt not urinating, possibly having dialysis 12/30/14.  Pt did urinate 50ml black cloudy urine, MD notified Complications-continues to not urinate Diet-pt not eating Activity-pt continues on bedrest

## 2014-12-30 NOTE — Progress Notes (Signed)
   12/30/14 1400  Clinical Encounter Type  Visited With Patient and family together  Visit Type Follow-up  Referral From Other (Comment) (Chaplain visited patient previously and wanted to follow up )  Consult/Referral To None  Spiritual Encounters  Spiritual Needs Prayer  Stress Factors  Patient Stress Factors Health changes  Family Stress Factors Family relationships  Advance Directives (For Healthcare)  Does patient have an advance directive? No   Visited patient as a follow up. Patient appeared to be happy but still having physical challenges. Chaplain provided empathic listening and therapeutic presence.  Chaplain A.D. Forde [facility] [phone]

## 2014-12-30 NOTE — Progress Notes (Signed)
Patient ID: Ryan Silversmithharles A Walters, male   DOB: 07/05/1971, 44 y.o.   MRN: [id number] [facility] PRO[facility]  Ryan SilversmithCharles A Krejci WUJ:[id number] DOB: 07/05/1971 DOA: 12/28/2014 PCP: No primary care provider on file.  HPI/Subjective: The patient ate [employer] today. His nausea is better than yesterday. No further vomiting.  Still with a not urinating very well. Feels weak.   Objective: Filed Vitals:   12/30/14 1236  BP: 112/61  Pulse: 81  Temp: 98.6 F (37 C)  Resp: 20    Intake/Output Summary (Last 24 hours) at 12/30/14 1257 Last data filed at 12/29/14 1900  Gross per 24 hour  Intake      0 ml  Output     50 ml  Net    -50 ml   Filed Weights   12/27/14 1322  Weight: 148.5 kg (327 lb 6.1 oz)    ROS: Review of Systems  Constitutional: Negative for fever and chills.  Eyes: Negative for blurred vision.  Respiratory: Negative for cough and shortness of breath.   Cardiovascular: Negative for chest pain.  Gastrointestinal: Positive for nausea. Negative for vomiting, diarrhea and constipation.  Genitourinary: Negative for dysuria.  Musculoskeletal: Negative for joint pain.  Neurological: Negative for dizziness and headaches.   Exam: Physical Exam  HENT:  Nose: No mucosal edema.  Mouth/Throat: No oropharyngeal exudate or posterior oropharyngeal edema.  Eyes: Conjunctivae, EOM and lids are normal. Pupils are equal, round, and reactive to light.  Neck: No JVD present. Carotid bruit is not present. No edema present. No thyroid mass and no thyromegaly present.  Cardiovascular: S1 normal and S2 normal.  Exam reveals no gallop.   No murmur heard. Pulses:      Dorsalis pedis pulses are 1+ on the right side, and 1+ on the left side.  Respiratory: No respiratory distress. He has no wheezes. He has no rhonchi. He has no rales.  GI: Soft. Bowel sounds are normal. There is no tenderness.  Lymphadenopathy:    He has no cervical adenopathy.  Neurological: He is alert. No cranial  nerve deficit.  Skin: Skin is warm. No rash noted. Nails show no clubbing.  Psychiatric: He has a normal mood and affect.  Skin chronic lower extremity discoloration.   Extremities: 3+ lower extremity edema   Data Reviewed: Basic Metabolic Panel:  Recent Labs Lab 12/27/14 0515 12/28/14 0554 12/28/14 1602 12/29/14 0436 12/30/14 0009  NA 132* 131* 130* 132* 130*  K 3.7 4.1 3.8 4.0 3.9  CL 92* 95* 94* 97* 97*  CO2 29 25 24 23  21*  GLUCOSE 119* 130* 121* 127* 101*  BUN 32* 44* 57* 67* 78*  CREATININE 1.52* 3.95* 5.55* 7.20* 9.29*  CALCIUM 8.5* 8.3* 8.1* 8.0* 7.7*   Liver Function Tests:  Recent Labs Lab 12/23/14 2219  AST 19  ALT 24  ALKPHOS 54  PROT 8.0  ALBUMIN 3.9   CBC:  Recent Labs Lab 12/24/14 0800  12/25/14 0637 12/26/14 0520 12/27/14 0515  12/28/14 0444 12/28/14 1602 12/29/14 0436 12/30/14 0009 12/30/14 0458  WBC 10.7*  --  9.1 7.2 6.1  --  11.3*  --  10.2 8.5 7.8  NEUTROABS 7.7*  --  6.1 4.5 3.6  --  7.7*  --   --   --   --   HGB 14.6  < > 14.0 13.4 12.9*  < > 12.1* 11.7* 10.9* 9.5* 9.4*  HCT 43.3  --  41.8 40.1 38.9*  --  34.9*  --  32.3* 28.5*  27.9*  MCV 90  --  90 90 90  --  90  --  90.5 89.9 90.0  PLT 136*  --  190 239 247  --  224  --  262 295 288  < > = values in this interval not displayed.       Studies: US Renal  12/30/2014   CLINICAL DATA:  Acute renal failure, history of DVT with IVC filter  EXAM: RENAL / URINARY TRACT ULTRASOUND COMPLETE  COMPARISON:  CT abdomen and pelvis 12/23/2014  FINDINGS: Right Kidney:  Length: 12.2 cm. Normal cortical thickness. Increased cortical echogenicity. No mass, hydronephrosis or shadowing calcification.  Left Kidney:  Length: 12.7 cm. Normal cortical thickness. Increased cortical echogenicity. No mass, hydronephrosis or shadowing calcification.  Bladder:  Decompressed, voided prior to exam, inadequately evaluated.  IMPRESSION: Increased renal cortical echogenicity bilaterally consistent with medical renal  disease.  No sonographic evidence of renal mass or urinary tract obstruction.   Electronically Signed   By: Ulyses Southward M.D.   On: 12/30/2014 09:22    Scheduled Meds: . insulin aspart  0-10 Units Subcutaneous TID AC & HS  . metoprolol tartrate  25 mg Oral BID  . ondansetron (ZOFRAN) IV  4 mg Intravenous 6 times per day  . pantoprazole (PROTONIX) IV  40 mg Intravenous Q12H   Continuous Infusions: . sodium chloride 75 mL/hr at 12/30/14 1228  . heparin 2,100 Units/hr (12/30/14 1235)    Assessment/Plan: #1 acute renal failure- Likely contrast induced.  -Since the procedure the creatinine has worsened. Today the creatinine is 9.29. The patient has poor urine output. I spoke with Dr. Luther Redo nephrologist and discussed the case. The patient will be continued on IV fluid hydration. The patient may end up needing temporary dialysis #2 extensive deep vein thrombosis. Patient is status post IVC filter and thrombolysis. The patient is on heparin drip. Oncology recommended Lovenox injections upon going home but I will be unable to do that secondary to acute renal failure. Continue heparin drip at this point.Consider long acting anticoagulation once the decision for dialysis catheter is made or not. #3 hypertension essential continue metoprolol. #4 hyponatremia. Today's sodium is 130. Hydrochlorothiazide stopped. #5 hypokalemia. Hydrochlorothiazide stopped. #6 metabolic syndrome #7 diabetes mellitus type 2 hemoglobin A1c 6.1 sliding scale coverage for now. #8 nausea vomiting could be secondary to the acute renal failure. Standing dose Zofran and when necessary Reglan and IV Protonix.Patient was able to eat a little bit today. #9 weakness physical therapy evaluation.   Code Status:     Code Status Orders        Start     Ordered   12/29/14 0001  Full code   Continuous     12/28/14 1152     Family Communication: Wife at bedside. Disposition Plan: Likely home once acute renal failure  resolved.  Consultants:  Dr. Lorretta Harp vascular surgery  Dr. Luther Redo nephrology  Procedures:  IVC filter placement  Time spent: 25 minutes coordination of care  Alford Highland  [facility]

## 2014-12-30 NOTE — Progress Notes (Signed)
[facility]  ROUNDING NOTE   Subjective:   Wife at bedside.  Creatinine 9.29 (7.2) Decreasing appetite, increasing peripheral edema. Less energy  Objective:  Vital signs in last 24 hours:  Temp:  [97.9 F (36.6 C)-98.2 F (36.8 C)] 98.2 F (36.8 C) (05/02 0537) Pulse Rate:  [76-89] 76 (05/02 0537) Resp:  [18] 18 (05/02 0537) BP: (128-137)/(70-80) 130/70 mmHg (05/02 0537) SpO2:  [99 %-100 %] 99 % (05/02 0537)  Weight change:  Filed Weights   12/27/14 1322  Weight: 148.5 kg (327 lb 6.1 oz)    Intake/Output: I/O last 3 completed shifts: In: 1188 [I.V.:1188] Out: 300 [Urine:50; Emesis/NG output:250]   Intake/Output this shift:     General: not in acute distress Head: /AT Neck: supple CVS: regular rate and rhythm Resp: clear  ABD: soft, nontender, +obese EXT: 1+ edema Neuro: nonfocal   Basic Metabolic Panel:  Recent Labs Lab 12/27/14 0515 12/28/14 0554 12/28/14 1602 12/29/14 0436 12/30/14 0009  NA 132* 131* 130* 132* 130*  K 3.7 4.1 3.8 4.0 3.9  CL 92* 95* 94* 97* 97*  CO2 29 25 24 23  21*  GLUCOSE 119* 130* 121* 127* 101*  BUN 32* 44* 57* 67* 78*  CREATININE 1.52* 3.95* 5.55* 7.20* 9.29*  CALCIUM 8.5* 8.3* 8.1* 8.0* 7.7*    Liver Function Tests:  Recent Labs Lab 12/23/14 2219  AST 19  ALT 24  ALKPHOS 54  PROT 8.0  ALBUMIN 3.9   No results for input(s): LIPASE, AMYLASE in the last 168 hours. No results for input(s): AMMONIA in the last 168 hours.  CBC:  Recent Labs Lab 12/24/14 0800  12/25/14 [id number] 12/26/14 0520 12/27/14 0515  12/28/14 0444 12/28/14 1602 12/29/14 0436 12/30/14 0009 12/30/14 0458  WBC 10.7*  --  9.1 7.2 6.1  --  11.3*  --  10.2 8.5 7.8  NEUTROABS 7.7*  --  6.1 4.5 3.6  --  7.7*  --   --   --   --   HGB 14.6  < > 14.0 13.4 12.9*  < > 12.1* 11.7* 10.9* 9.5* 9.4*  HCT 43.3  --  41.8 40.1 38.9*  --  34.9*  --  32.3* 28.5* 27.9*  MCV 90  --  90 90 90  --  90  --  90.5 89.9 90.0  PLT 136*  --  190 239  247  --  224  --  262 295 288  < > = values in this interval not displayed.  Cardiac Enzymes: No results for input(s): CKTOTAL, CKMB, CKMBINDEX, TROPONINI in the last 168 hours.  BNP: Invalid input(s): POCBNP  CBG:  Recent Labs Lab 12/29/14 1614 12/29/14 2001 12/29/14 2256 12/30/14 0710 12/30/14 1105  GLUCAP 104* 116* 103* 104* 100*    Microbiology: Results for orders placed or performed during the hospital encounter of 12/28/14  Culture, blood (single)     Status: None (Preliminary result)   Collection Time: 12/27/14  8:34 PM  Result Value Ref Range Status   Micro Text Report   Preliminary       COMMENT                   NO GROWTH IN 36 HOURS   ANTIBIOTIC  Culture, blood (single)     Status: None (Preliminary result)   Collection Time: 12/27/14  8:40 PM  Result Value Ref Range Status   Micro Text Report   Preliminary       COMMENT                   NO GROWTH IN 36 HOURS   ANTIBIOTIC                                                        Coagulation Studies: No results for input(s): LABPROT, INR in the last 72 hours.  Urinalysis: No results for input(s): COLORURINE, LABSPEC, PHURINE, GLUCOSEU, HGBUR, BILIRUBINUR, KETONESUR, PROTEINUR, UROBILINOGEN, NITRITE, LEUKOCYTESUR in the last 72 hours.  Invalid input(s): APPERANCEUR    Imaging: US Renal  12/30/2014   CLINICAL DATA:  Acute renal failure, history of DVT with IVC filter  EXAM: RENAL / URINARY TRACT ULTRASOUND COMPLETE  COMPARISON:  CT abdomen and pelvis 12/23/2014  FINDINGS: Right Kidney:  Length: 12.2 cm. Normal cortical thickness. Increased cortical echogenicity. No mass, hydronephrosis or shadowing calcification.  Left Kidney:  Length: 12.7 cm. Normal cortical thickness. Increased cortical echogenicity. No mass, hydronephrosis or shadowing calcification.  Bladder:  Decompressed, voided prior to exam, inadequately evaluated.  IMPRESSION: Increased renal  cortical echogenicity bilaterally consistent with medical renal disease.  No sonographic evidence of renal mass or urinary tract obstruction.   Electronically Signed   By: Ulyses Southward M.D.   On: 12/30/2014 09:22     Medications:   . sodium chloride 75 mL/hr at 12/30/14 1228  . heparin 2,100 Units/hr (12/29/14 2312)   . insulin aspart  0-10 Units Subcutaneous TID AC & HS  . metoprolol tartrate  25 mg Oral BID  . ondansetron (ZOFRAN) IV  4 mg Intravenous 6 times per day  . pantoprazole (PROTONIX) IV  40 mg Intravenous Q12H   acetaminophen, alum & mag hydroxide-simeth, docusate sodium, metoCLOPramide (REGLAN) injection, morphine injection, ondansetron (ZOFRAN) IV, simethicone, sodium chloride  Assessment/ Plan:  44 y.o. 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 12/28/2014 for with right lower extremity DVT.   1. Acute renal failure unspec. Multiple bouts of contrast exposure this admission. Baseline creatinine of 0.99 12/20/2014 - Creatinine and BUN continues to increase. Low threshold to start dialysis. Discussed with patient and wife. Will need HD temp catheter if dialysis needed.  - will monitor closely.  - Renally dose all medications. Avoid nephrotoxic agents.   2. Hypertension: well controlled. On metoprolol.   3. Hypercholesterolemia: not currently on a statin  4. Acute thrombosis I82.412: on heparin gtt.    LOS: 2 Ryan [name] 5/2/201612:32 PM

## 2014-12-30 NOTE — Op Note (Addendum)
PATIENT NAME:  Ryan [name], Ryan [name] MR#:  [id number] DATE OF BIRTH:  01-20-1971  DATE OF PROCEDURE:  12/27/2014  PREOPERATIVE DIAGNOSES:  1. Extensive deep vein thrombosis, including the superficial femoral vein, common femoral vein,  external and common iliac veins and the inferior vena cava.  2. Gait disturbance secondary to lower extremity pain, secondary to deep vein thrombosis.  3. Hypercoagulable state.   POSTOPERATIVE DIAGNOSES:  1. Extensive deep vein thrombosis, including the superficial femoral vein, common femoral vein,  external and common iliac veins and the inferior vena cava.  2. Gait disturbance secondary to lower extremity pain, secondary to deep vein thrombosis.  3. Hypercoagulable state.   PROCEDURES PERFORMED:  1. Introduction catheter into inferior vena cava, right femoral vein venous approach.  2. Introduction catheter inferior vena cava, left femoral approach.  3. Infusion of thrombolysis with tPA.  4. Mechanical thrombectomy via right groin approach.  5. Mechanical thrombectomy via a left groin approach.  6. Percutaneous transluminal angioplasty of the right common iliac vein.  7. Percutaneous transluminal angioplasty of the left common iliac vein.  8. Percutaneous transluminal angioplasty of the left external iliac vein.  9. Insertion of inferior vena cava filter, right jugular approach.  10. Ultrasound guidance for insertion of both the right and left femoral sheath.   PROCEDURE PERFORMED BY: Renford Dills, MD   SEDATION: Versed plus fentanyl.   CONTRAST USED: Isovue 80 mL.   FLUOROSCOPY TIME: 21.8 minutes.   ACCESS:  1. An 8 French sheath, right superficial femoral vein.  2. An 8 French sheath, left superficial femoral vein.  3. A 9.5 French delivery sheath, right internal jugular.   INDICATIONS: Mr. Coufal is a 44 year old gentleman with a known hypercoagulable state, who has no longer been taking Coumadin and presented with profound lower extremity pain  and inability to walk associated with tremendous swelling. Upon admission, radiographic studies demonstrated acute deep vein thrombosis, including the bilateral superficial femoral veins, common femoral veins, iliac veins and IVC with thrombus extending several centimeters above the pre-existing IVC filter. He has been initiated on anticoagulation, but now is undergoing attempts at thrombolysis secondary to the extensive debility and increased pain that he is experiencing. Risks and benefits have been discussed in detail on several occasions. All questions have been answered. He agrees to proceed.   DESCRIPTION OF PROCEDURE: The patient is taken to special procedures and placed in the supine position. After adequate sedation is achieved, his right neck, as well as both groins, are prepped and draped in sterile fashion. Appropriate timeout is called.   Ultrasound is placed in a sterile sleeve. Jugular vein is identified. It is echolucent and compressible, indicating patency. Image is recorded for the permanent record and under real-time visualization access to the jugular vein is obtained with a Seldinger needle. A J-wire is then advanced under fluoroscopy into the inferior vena cava. Dilator is passed over the wire and subsequently the delivery sheath is advanced. The wire and sheath are negotiated distal to the filter and hand injection of contrast is used to demonstrate the IVC. Extensive thrombus is noted within the filter itself, as well as extending above the filter. Both renal veins are localized with reflux of contrast into the veins and the suprarenal portion of the cava is imaged. The measurements are made and the suprarenal cava measures 24 mm in diameter. Therefore, the sheath is repositioned and a Cook Celect filter is deployed without difficulty in the suprarenal segment. The sheath is pulled, pressure is  held, and there are no immediate complications with the initial portion of the case.    Attention is then turned to the groins. Again, ultrasound is utilized. The femoral veins are identified and a segment of the superficial femoral vein is isolated. There is heterogenous material within the vein and it is noncompressible, consistent with a known thrombus. This procedure is repeated both on the right and the left and 8 French sheaths were placed using the standard technique. Wires are then negotiated under fluoroscopic guidance with the Kumpe catheter through filter to allow for purchase and the AngioJet is prepped on the table. A 16 mg of t-PA is reconstituted at 100 mL total volume and then both the right and left sides are laced with the t-PA using proximally 60 mL on the right and 50 mL on the left. Dwell time is allowed to be for 1/2 hour, and subsequently the AngioJet is reintroduced first on the right side and then the left. Multiple passes are made with extraction of a significant amount of thrombus. Hand injections through the sheaths demonstrate narrowing and poor flow in the common iliacs at the confluence with the cava. On the right, the external, appears quite good. On the left, the external, appears to have a narrowing. Therefore, a 10 x 8 balloon is advanced up the right, across the confluence and a 10 x 6 balloon is advanced up the left. Simultaneous inflation using a kissing technique is used to angioplasty the common iliac veins. On the left, the balloon is taken down. It is then repositioned into the external and angioplasty is performed at this level. Followup imaging after all of this demonstrates wide patency within the iliacs, with a surprisingly small amount of thrombus. Common femorals also appear to have flow, as do the external iliacs, common iliacs and cava. The majority of thrombus that was noted to be within the filter is gone and there is only minimal residual thrombus noted in the suprarenal portion.   Wires are then removed. Sheaths were then pulled and pressure  is held at these 2 locations. Safeguards are applied. The patient tolerated the procedure well and there were no immediate complications.   INTERPRETATION: Initial imaging of the suprarenal table as described above, demonstrates 24 mm in diameter and a suprarenal filter is placed without difficulty. Thrombus is noted within the superficial femoral veins continuous to the pre-existing infrarenal filter with thrombus extending several centimeters above the infrarenal filter bilaterally.   Following both infusion of tPA, as well as mechanical thrombectomy, significant narrowing is noted at the confluence of the iliac veins with the IVC. These are treated with a 10 mm simultaneous angioplasty with an excellent result. There is also a narrowing noted within the external iliac vein on the left and this is also treated with angioplasty with an excellent result. Final image demonstrates that there is now forward flow throughout the common femoral iliac and inferior vena cava with minimal residual thrombus.     ____________________________ Renford DillsGregory G. Nethaniel Mattie, MD ggs:AT D: 12/27/2014 20:11:15 ET T: 12/28/2014 11:01:16 ET JOB#: [id number]  cc: Renford DillsGregory G. Nylia Gavina, MD, <Dictator> Renford DillsGREGORY G Mayara Paulson MD ELECTRONICALLY SIGNED 01/21/2015 8:53

## 2014-12-30 NOTE — Plan of Care (Signed)
Problem: Discharge Progression Outcomes Goal: Other Discharge Outcomes/Goals Outcome: Progressing Progressing toward goals: *Barriers to progression - inability to void *discharge plan - home  *pain control - no request for prn pain med this shift *hemodynamics - continues heparin drip *complications - inability to void *Diet - poor appetite, encouraging to eat *Activity - PT treating for strenghtening

## 2014-12-30 NOTE — Progress Notes (Signed)
ANTICOAGULATION CONSULT NOTE - Follow Up Consult  Pharmacy Consult for Ryan [name], Felder Indication: DVT  No Known Allergies  Patient Measurements: Height: 5' 6.97" (170.1 cm) Weight: (!) 327 lb 6.1 oz (148.5 kg) IBW/kg (Calculated) : 66.03 Heparin Dosing Weight: 102  Vital Signs: Temp: 98.2 F (36.8 C) (05/02 0537) Temp Source: Oral (05/02 0537) BP: 130/70 mmHg (05/02 0537) Pulse Rate: 76 (05/02 0537)  Labs:  Recent Labs  12/28/14 1541 12/28/14 1602  12/29/14 0436 12/29/14 1538 12/30/14 0009 12/30/14 0458  HGB  --  11.7*  < > 10.9*  --  9.5* 9.4*  HCT  --   --   --  32.3*  --  28.5* 27.9*  PLT  --   --   --  262  --  295 288  APTT 75.2*  --   --  116*  --   --   --   HEPARINUNFRC  --   --   < > 0.78* 0.55 0.51 0.56  CREATININE  --  5.55*  --  7.20*  --  9.29*  --   < > = values in this interval not displayed.  Estimated Creatinine Clearance: 14.1 mL/min (by C-G formula based on Cr of 9.29).   Medications:  Scheduled:  . insulin aspart  0-10 Units Subcutaneous TID AC & HS  . metoprolol tartrate  25 mg Oral BID  . ondansetron (ZOFRAN) IV  4 mg Intravenous 6 times per day  . pantoprazole (PROTONIX) IV  40 mg Intravenous Q12H    Assessment: Heparin level is within range.   Goal of Therapy:  Heparin level 0.3-0.7 units/ml Monitor platelets by anticoagulation protocol: Yes   Plan:  Will continue heparin infusion at 2100 units/hr and f/u AM labs.   Luisa Harthristy, Zendayah Hardgrave D 12/30/2014,11:39 AM

## 2014-12-31 ENCOUNTER — Inpatient Hospital Stay: Payer: 59

## 2014-12-31 ENCOUNTER — Ambulatory Visit
Admission: RE | Admit: 2014-12-31 | Discharge: 2014-12-31 | Disposition: A | Discharge status: Home / Self Care | Payer: 59 | Source: Ambulatory Visit | Attending: Orthopedic Surgery | Admitting: Orthopedic Surgery

## 2014-12-31 LAB — RENAL FUNCTION PANEL
Albumin: 2.8 g/dL — ABNORMAL LOW (ref 3.5–5.0)
Anion gap: 13 (ref 5–15)
BUN: 98 mg/dL — ABNORMAL HIGH (ref 6–20)
CO2: 19 mmol/L — AB (ref 22–32)
Calcium: 7.7 mg/dL — ABNORMAL LOW (ref 8.9–10.3)
Chloride: 100 mmol/L — ABNORMAL LOW (ref 101–111)
Creatinine, Ser: 13.05 mg/dL — ABNORMAL HIGH (ref 0.61–1.24)
GFR calc non Af Amer: 4 mL/min — ABNORMAL LOW (ref >60)
GFR, EST AFRICAN AMERICAN: 5 mL/min — AB (ref >60)
GLUCOSE: 102 mg/dL — AB (ref 65–99)
POTASSIUM: 4.5 mmol/L (ref 3.5–5.1)
Phosphorus: 7.3 mg/dL — ABNORMAL HIGH (ref 2.5–4.6)
Sodium: 132 mmol/L — ABNORMAL LOW (ref 135–145)

## 2014-12-31 LAB — BASIC METABOLIC PANEL
Anion gap: 12 (ref 5–15)
BUN: 94 mg/dL — AB (ref 6–20)
CHLORIDE: 101 mmol/L (ref 101–111)
CO2: 21 mmol/L — ABNORMAL LOW (ref 22–32)
Calcium: 7.7 mg/dL — ABNORMAL LOW (ref 8.9–10.3)
Creatinine, Ser: 12.2 mg/dL — ABNORMAL HIGH (ref 0.61–1.24)
GFR calc Af Amer: 5 mL/min — ABNORMAL LOW (ref >60)
GFR calc non Af Amer: 4 mL/min — ABNORMAL LOW (ref >60)
Glucose, Bld: 113 mg/dL — ABNORMAL HIGH (ref 65–99)
POTASSIUM: 4.2 mmol/L (ref 3.5–5.1)
Sodium: 134 mmol/L — ABNORMAL LOW (ref 135–145)

## 2014-12-31 LAB — GLUCOSE, CAPILLARY
Glucose-Capillary: 115 mg/dL — ABNORMAL HIGH (ref 70–99)
Glucose-Capillary: 112 mg/dL — ABNORMAL HIGH (ref 70–99)
Glucose-Capillary: 102 mg/dL — ABNORMAL HIGH (ref 70–99)
Glucose-Capillary: 95 mg/dL (ref 70–99)

## 2014-12-31 LAB — CBC
HCT: 27.9 % — ABNORMAL LOW (ref 40.0–52.0)
HCT: 28.3 % — ABNORMAL LOW (ref 40.0–52.0)
Hemoglobin: 9.5 g/dL — ABNORMAL LOW (ref 13.0–18.0)
Hemoglobin: 9.5 g/dL — ABNORMAL LOW (ref 13.0–18.0)
MCH: 30.7 pg (ref 26.0–34.0)
MCH: 30 pg (ref 26.0–34.0)
MCHC: 33.8 g/dL (ref 32.0–36.0)
MCHC: 33.8 g/dL (ref 32.0–36.0)
MCV: 90.8 fL (ref 80.0–100.0)
MCV: 88.9 fL (ref 80.0–100.0)
PLATELETS: 335 10*3/uL (ref 150–440)
Platelets: 334 10*3/uL (ref 150–440)
RBC: 3.08 MIL/uL — ABNORMAL LOW (ref 4.40–5.90)
RBC: 3.18 MIL/uL — ABNORMAL LOW (ref 4.40–5.90)
RDW: 14.3 % (ref 11.5–14.5)
RDW: 14.2 % (ref 11.5–14.5)
WBC: 6.3 10*3/uL (ref 3.8–10.6)
WBC: 7 10*3/uL (ref 3.8–10.6)

## 2014-12-31 LAB — HEPARIN LEVEL (UNFRACTIONATED): Heparin Unfractionated: 0.59 IU/mL (ref 0.30–0.70)

## 2014-12-31 MED ORDER — MIDAZOLAM HCL 2 MG/2ML IJ SOLN
2.0000 mg | INTRAMUSCULAR | Status: AC
  Administered 2014-12-31: 2 mg via INTRAVENOUS

## 2014-12-31 MED ORDER — SODIUM CHLORIDE 0.9 % IV SOLN: 100.0000 mL | INTRAVENOUS | Status: DC | PRN

## 2014-12-31 MED ORDER — LORAZEPAM 1 MG PO TABS
1.0000 mg | ORAL_TABLET | Freq: Every day | ORAL | Status: DC
  Administered 2014-12-31 – 2015-01-14 (×15): 1 mg via ORAL
  Filled 2014-12-31 (×15): qty 1

## 2014-12-31 NOTE — Progress Notes (Signed)
Physical Therapy Treatment Patient Details Name: Ryan [name] MRN: [id number] DOB: [birth year] Today's Date: 12/31/2014    History of Present Illness 44 yo Male came to ED with acute DVT, edema and shortness of breath. Patient was started on Heparin drip  and on 4/28 underwent mechanical thrombectomy R/L venous system and IVC filter placement. Pateint has a PMH significant for right MCL tear. He also reports dizziness with positional change.    PT Comments    Pt agreeable to PT however stating feeling more fatigued as continued to feel tightness in LE due to fluid buildup. Pt attempted to transfer to sitting requiring more effort in supine to sit and more assist in sit to supine.  Pt's tolerance at sitting at EOB was lessened compared to previous session. Pt was able to perform supine sitting therex however more exerted effort was noted. He would continue to benefit from PT to improve bed mobility, transfers, and functional mobility tolerance.    Follow Up Recommendations  Home health PT     Equipment Recommendations  Other (comment) (Bariatric front wheeled walker)    Recommendations for Other Services       Precautions / Restrictions Precautions Precautions: None Restrictions Weight Bearing Restrictions: No Other Position/Activity Restrictions: FWB    Mobility  Bed Mobility Overal bed mobility: Needs Assistance Bed Mobility: Supine to Sit;Sit to Supine     Supine to sit: Min assist Sit to supine: Mod assist   General bed mobility comments: increased assistance required today for sit to supine due to pt c/o increased pain/tightness in groin area  Transfers Overall transfer level: Needs assistance                  Ambulation/Gait                 Stairs            Wheelchair Mobility    Modified Rankin (Stroke Patients Only)       Balance                                    Cognition Arousal/Alertness: Awake/alert Behavior  During Therapy: WFL for tasks assessed/performed Overall Cognitive Status: Within Functional Limits for tasks assessed                      Exercises General Exercises - Lower Extremity Ankle Circles/Pumps: Both;20 reps;Supine Short Arc Quad: AROM;15 reps;Both Heel Slides: AROM;Both;15 reps Hip ABduction/ADduction: AROM;Both;15 reps    General Comments        Pertinent Vitals/Pain Pain Assessment: No/denies pain Pain Descriptors / Indicators: Tightness Pain Intervention(s): Limited activity within patient's tolerance    Home Living                      Prior Function            PT Goals (current goals can now be found in the care plan section) Acute Rehab PT Goals Time For Goal Achievement: 01/19/15 Potential to Achieve Goals: Good Progress towards PT goals: Progressing toward goals    Frequency  Min 2X/week    PT Plan Current plan remains appropriate    Co-evaluation             End of Session   Activity Tolerance: Patient limited by fatigue Patient left: in bed;with call bell/phone within reach;with bed alarm set  Time: 1425-1450 PT Time Calculation (min) (ACUTE ONLY): 25 min  Charges:  $Therapeutic Exercise: 8-22 mins $Therapeutic Activity: 8-22 mins                    G Codes:      Malaki Koury 12/31/2014, 3:05 PM Karinna Beadles, PTA

## 2014-12-31 NOTE — Progress Notes (Signed)
[facility]  ROUNDING NOTE   Subjective:   Patient laying in bed. No appetite, feeling weak. Complains of increasing peripheral edema Creatinine 12.2 (9.29) (7.2)  Objective:  Vital signs in last 24 hours:  Temp:  [98.4 F (36.9 C)-98.7 F (37.1 C)] 98.4 F (36.9 C) (05/03 0527) Pulse Rate:  [81-88] 84 (05/03 0527) Resp:  [20] 20 (05/03 0527) BP: (112-132)/(61-67) 132/64 mmHg (05/03 0527) SpO2:  [93 %-97 %] 94 % (05/03 0527)  Weight change:  Filed Weights   12/27/14 1322  Weight: 148.5 kg (327 lb 6.1 oz)    Intake/Output: I/O last 3 completed shifts: In: 1077 [I.V.:1077] Out: -    Intake/Output this shift:     General: not in acute distress Head: [place]/AT, PERRL, moist oral mucosal membranes Neck: supple, trachea midline CVS: regular rate and rhythm Resp: clear  ABD: soft, nontender, +obese EXT: 1+ edema Neuro: nonfocal   Basic Metabolic Panel:  Recent Labs Lab 12/28/14 0554 12/28/14 1602 12/29/14 0436 12/30/14 0009 12/31/14 0505  NA 131* 130* 132* 130* 134*  K 4.1 3.8 4.0 3.9 4.2  CL 95* 94* 97* 97* 101  CO2 21* 21*  GLUCOSE 130* 121* 127* 101* 113*  BUN 44* 57* 67* 78* 94*  CREATININE 3.95* 5.55* 7.20* 9.29* 12.20*  CALCIUM 8.3* 8.1* 8.0* 7.7* 7.7*    Liver Function Tests: No results for input(s): AST, ALT, ALKPHOS, BILITOT, PROT, ALBUMIN in the last 168 hours. No results for input(s): LIPASE, AMYLASE in the last 168 hours. No results for input(s): AMMONIA in the last 168 hours.  CBC:  Recent Labs Lab 12/25/14 8295 12/26/14 0520 12/27/14 0515  12/28/14 0444 12/28/14 1602 12/29/14 0436 12/30/14 0009 12/30/14 0458 12/31/14 0505  WBC 9.1 7.2 6.1  --  11.3*  --  10.2 8.5 7.8 6.3  NEUTROABS 6.1 4.5 3.6  --  7.7*  --   --   --   --   --   HGB 14.0 13.4 12.9*  < > 12.1* 11.7* 10.9* 9.5* 9.4* 9.5*  HCT 41.8 40.1 38.9*  --  34.9*  --  32.3* 28.5* 27.9* 27.9*  MCV 90 90 90  --  90  --  90.5 89.9 90.0 90.8  PLT 190 239  247  --  224  --  262 295 288 334  < > = values in this interval not displayed.  Cardiac Enzymes: No results for input(s): CKTOTAL, CKMB, CKMBINDEX, TROPONINI in the last 168 hours.  BNP: Invalid input(s): POCBNP  CBG:  Recent Labs Lab 12/30/14 0710 12/30/14 1105 12/30/14 1648 12/30/14 2149 12/31/14 0748  GLUCAP 104* 100* 93 103* 115*    Microbiology: Results for orders placed or performed during the hospital encounter of 12/28/14  Culture, blood (single)     Status: None (Preliminary result)   Collection Time: 12/27/14  8:34 PM  Result Value Ref Range Status   Micro Text Report   Preliminary       COMMENT                   NO GROWTH IN 36 HOURS   ANTIBIOTIC                                                      Culture, blood (single)     Status: None (  Preliminary result)   Collection Time: 12/27/14  8:40 PM  Result Value Ref Range Status   Micro Text Report   Preliminary       COMMENT                   NO GROWTH IN 36 HOURS   ANTIBIOTIC                                                        Coagulation Studies: No results for input(s): LABPROT, INR in the last 72 hours.  Urinalysis: No results for input(s): COLORURINE, LABSPEC, PHURINE, GLUCOSEU, HGBUR, BILIRUBINUR, KETONESUR, PROTEINUR, UROBILINOGEN, NITRITE, LEUKOCYTESUR in the last 72 hours.  Invalid input(s): APPERANCEUR    Imaging: [facility]  12/30/2014   CLINICAL DATA:  Acute renal failure, history of DVT with IVC filter  EXAM: RENAL / URINARY TRACT ULTRASOUND COMPLETE  COMPARISON:  CT abdomen and pelvis 12/23/2014  FINDINGS: Right Kidney:  Length: 12.2 cm. Normal cortical thickness. Increased cortical echogenicity. No mass, hydronephrosis or shadowing calcification.  Left Kidney:  Length: 12.7 cm. Normal cortical thickness. Increased cortical echogenicity. No mass, hydronephrosis or shadowing calcification.  Bladder:  Decompressed, voided prior to exam, inadequately evaluated.  IMPRESSION: Increased renal  cortical echogenicity bilaterally consistent with medical renal disease.  No sonographic evidence of renal mass or urinary tract obstruction.   Electronically Signed   By: Ulyses SouthwardMark  Boles M.D.   On: 12/30/2014 09:22     Medications:   . heparin 2,100 Units/hr (12/31/14 0243)   . insulin aspart  0-10 Units Subcutaneous TID AC & HS  . metoprolol tartrate  25 mg Oral BID  . ondansetron (ZOFRAN) IV  4 mg Intravenous 6 times per day  . pantoprazole (PROTONIX) IV  40 mg Intravenous Q12H   acetaminophen, alum & mag hydroxide-simeth, docusate sodium, metoCLOPramide (REGLAN) injection, morphine injection, ondansetron (ZOFRAN) IV, simethicone, sodium chloride  Assessment/ Plan:  44 y.o. 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   1. Acute renal failure: [name]. Multiple contrast exposure this admission. Baseline creatinine of 0.99. 12/20/2014 - Creatinine and BUN continues to increase. At this time, with uremic symptoms and volume overload. Will need to start hemodialysis today.  - Consult Vascular surgery for HD temp catheter. Start HD today with 1.5 hours, BFR 150 DFR 300 - will monitor volume status, urine output and renal function closely.  - Renally dose all medications. Avoid nephrotoxic agents.   2. Hypertension: well controlled. On metoprolol.   3. Hypercholesterolemia: not currently on a statin  4. Acute thrombosis I82.412: on heparin gtt. Appreciate vascular input.    LOS: 3 Citlalli Weikel 5/3/20169:40 AM

## 2014-12-31 NOTE — Op Note (Signed)
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  Postoperative diagnosis: Same  Procedure performed: Insertion of left IJ temporary dialysis catheter with ultrasound guidance.  Procedure performed by Levora DredgeGregory Lequita Meadowcroft M.D.  Anesthesia: 1% lidocaine local infiltration, Versed 2 mg IV.  Procedure: The patient is positioned supine after informed consent is obtained. His left neck is prepped and draped in a sterile fashion. 2 mg of Versed is given IV. Ultrasound was placed in a sterile sleeve and the jugular vein on the left is identified. It is noted to be echolucent and easily compressible indicating patency. Images recorded for the permanent record. Under real-time visualization after 1% lidocaine as him and infiltrated into the skin and soft tissues a micropuncture needle is inserted into the jugular vein. Microwire followed by micro-sheath. J-wire is then advanced without difficulty small counter incision was created with 11 blade scalpel dilators passed over the wire and subsequently the triple lumen temporary dialysis catheter is advanced without difficulty.  All 3 lumens aspirate and flush easily. The catheter is then secured to the skin of the neck with 2-0 nylon suture in a sterile dressing including a Biopatch was applied. Patient tolerated procedure well and there were no immediate complications.

## 2014-12-31 NOTE — Progress Notes (Signed)
Initial Nutrition Assessment  DOCUMENTATION CODES:   INTERVENTION:  Meals and Snacks: Cater to patient preferences   NUTRITION DIAGNOSIS:  Inadequate oral intake related to acute illness as evidenced by pt reports a poor appetite/ intake x 6 days.    GOAL: Energy Intake:Patient will meet greater than or equal to 90% of their needs   MONITOR:  PO intake, Labs, Skin  REASON FOR ASSESSMENT:  Other (Comment) (LOS)    ASSESSMENT: Reason For Admission: Admitted with acute deep vein thrombosis PMHx: Current Diet:  Typical Fluid/ Food Intake: bites and sips recorded per I/O; per patient report, he hasn't been eating well while in hospital as well as about 4 days Meal/ Snack Patterns: Pt follows a low carb diet at home with no other restrictions. Reports a poor appetite/ intake about 4 days before hospital admission. Prior to that, pt reports eating well. Supplements: None  Labs:  Electrolyte and Renal Profile:    Recent Labs Lab 12/29/14 0436 12/30/14 0009 12/31/14 0505  BUN 67* 78* 94*  CREATININE 7.20* 9.29* 12.20*  NA 132* 130* 134*  K 4.0 3.9 4.2   Glucose Profile:  Recent Labs  12/30/14 1648 12/30/14 2149 12/31/14 0748  GLUCAP 93 103* 115*    Meds: NS@ 6675ml/ hr, Colace, SSI  UOP: Reviewed Digestive System: Last BM: 12/29/14  Weight Changes: Pt unsure of UBW but reports that he doesn't feel like he has lost weight.    Height:  Ht Readings from Last 1 Encounters:  12/27/14 5' 6.97" (1.701 m)    Weight:  Wt Readings from Last 1 Encounters:  12/27/14 327 lb 6.1 oz (148.5 kg)    Ideal Body Weight:  67 kg  Wt Readings from Last 10 Encounters:  12/27/14 327 lb 6.1 oz (148.5 kg)    BMI:  Body mass index is 51.32 kg/(m^2).  Estimated Nutritional Needs:  Kcal:  1816-2179 kcal/day (BEE: 1513 x 1.2 AF x 1.0-1.2 IF)- using IBW  Protein:  80-94 g Pro/day (1.2-1.4 g/ kg IBW/ day)- using IBW  Fluid:  1675-2010 ml/ day (25-30 ml/ kg IBW)- using  IBW  Skin:  Reviewed, no issues  Diet Order:  Diet Carb Modified Fluid consistency:: Thin; Room service appropriate?: Yes  EDUCATION NEEDS:  No education needs identified at this time   Intake/Output Summary (Last 24 hours) at 12/31/14 0844 Last data filed at 12/30/14 1800  Gross per 24 hour  Intake   1077 ml  Output      0 ml  Net   1077 ml    Last BM:  5/1  Joeseph Amorracey L. Gaines, RDN [phone]  LOW Care Level

## 2014-12-31 NOTE — Progress Notes (Signed)
Patient ID: Ryan [name], male   DOB: [birth year], 44 y.o.   MRN: [id number] [facility] PROGRESS NOTE  JAGGER DEMONTE WUJ:[id number] DOB: [birth year] DOA: 12/28/2014 PCP: No primary care provider on file.  HPI/Subjective: The patient feels okay. Able to eat a little bit better yesterday. No further nausea or vomiting. No abdominal pain. Still urinating very little and it's dark. No shortness of breath. Very swollen in his legs and unable to walk much with physical therapy.   Objective: Filed Vitals:   12/31/14 0527  BP: 132/64  Pulse: 84  Temp: 98.4 F (36.9 C)  Resp: 20    Intake/Output Summary (Last 24 hours) at 12/31/14 0813 Last data filed at 12/30/14 1800  Gross per 24 hour  Intake   1077 ml  Output      0 ml  Net   1077 ml   Filed Weights   12/27/14 1322  Weight: 148.5 kg (327 lb 6.1 oz)    ROS: Review of Systems  Constitutional: Negative for fever and chills.  Eyes: Negative for blurred vision.  Respiratory: Negative for cough and shortness of breath.   Cardiovascular: Negative for chest pain.  Gastrointestinal: Negative for nausea, vomiting, diarrhea and constipation.  Genitourinary: Positive for dysuria.  Musculoskeletal: Negative for joint pain.  Neurological: Negative for dizziness and headaches.   Exam: Physical Exam  HENT:  Nose: No mucosal edema.  Mouth/Throat: No oropharyngeal exudate or posterior oropharyngeal edema.  Eyes: Conjunctivae, EOM and lids are normal. Pupils are equal, round, and reactive to light.  Neck: No JVD present. Carotid bruit is not present. No edema present. No thyroid mass and no thyromegaly present.  Cardiovascular: S1 normal and S2 normal.  Exam reveals no gallop.   No murmur heard. Pulses:      Dorsalis pedis pulses are 1+ on the right side, and 1+ on the left side.  Respiratory: No respiratory distress. He has no wheezes. He has no rhonchi. He has no rales.  GI: Soft. Bowel sounds are normal. There is no  tenderness.  Lymphadenopathy:    He has no cervical adenopathy.  Neurological: He is alert. No cranial nerve deficit.  Skin: Skin is warm. No rash noted. Nails show no clubbing.  Psychiatric: He has a normal mood and affect.  Skin chronic lower extremity discoloration.   Extremities: 3+ lower extremity edema   Data Reviewed: Basic Metabolic Panel:  Recent Labs Lab 12/28/14 0554 12/28/14 1602 12/29/14 0436 12/30/14 0009 12/31/14 0505  NA 131* 130* 132* 130* 134*  K 4.1 3.8 4.0 3.9 4.2  CL 95* 94* 97* 97* 101  CO2 21* 21*  GLUCOSE 130* 121* 127* 101* 113*  BUN 44* 57* 67* 78* 94*  CREATININE 3.95* 5.55* 7.20* 9.29* 12.20*  CALCIUM 8.3* 8.1* 8.0* 7.7* 7.7*   CBC:  Recent Labs Lab 12/25/14 9562 12/26/14 0520 12/27/14 0515  12/28/14 0444 12/28/14 1602 12/29/14 0436 12/30/14 0009 12/30/14 0458 12/31/14 0505  WBC 9.1 7.2 6.1  --  11.3*  --  10.2 8.5 7.8 6.3  NEUTROABS 6.1 4.5 3.6  --  7.7*  --   --   --   --   --   HGB 14.0 13.4 12.9*  < > 12.1* 11.7* 10.9* 9.5* 9.4* 9.5*  HCT 41.8 40.1 38.9*  --  34.9*  --  32.3* 28.5* 27.9* 27.9*  MCV 90 90 90  --  90  --  90.5 89.9 90.0 90.8  PLT 190 239 247  --  224  --  262 295 288 334  < > = values in this interval not displayed.       Studies: [facility]  12/30/2014   CLINICAL DATA:  Acute renal failure, history of DVT with IVC filter  EXAM: RENAL / URINARY TRACT ULTRASOUND COMPLETE  COMPARISON:  CT abdomen and pelvis 12/23/2014  FINDINGS: Right Kidney:  Length: 12.2 cm. Normal cortical thickness. Increased cortical echogenicity. No mass, hydronephrosis or shadowing calcification.  Left Kidney:  Length: 12.7 cm. Normal cortical thickness. Increased cortical echogenicity. No mass, hydronephrosis or shadowing calcification.  Bladder:  Decompressed, voided prior to exam, inadequately evaluated.  IMPRESSION: Increased renal cortical echogenicity bilaterally consistent with medical renal disease.  No sonographic evidence of  renal mass or urinary tract obstruction.   Electronically Signed   By: Ulyses SouthwardMark  Boles M.D.   On: 12/30/2014 09:22    Scheduled Meds: . insulin aspart  0-10 Units Subcutaneous TID AC & HS  . metoprolol tartrate  25 mg Oral BID  . ondansetron (ZOFRAN) IV  4 mg Intravenous 6 times per day  . pantoprazole (PROTONIX) IV  40 mg Intravenous Q12H   Continuous Infusions: . sodium chloride 75 mL/hr at 12/31/14 0138  . heparin 2,100 Units/hr (12/31/14 0243)    Assessment/Plan: #1 acute renal failure, ATN- Likely contrast induced.  -Since the procedure the creatinine has worsened. Today the creatinine is 12.2. The patient has poor urine output. I advised the patient to discuss and consider dialysis with the nephrology team. The patient will stay in the hospital until kidney function improves. #2 extensive deep vein thrombosis. Patient is status post IVC filter and thrombolysis. The patient is on heparin drip. Oncology recommended Lovenox injections upon going home but I will be unable to do that secondary to acute renal failure. Continue heparin drip at this point.Consider long acting anticoagulation once the decision for dialysis catheter is made or not. #3 hypertension essential continue metoprolol. #4 hyponatremia. Hydrochlorothiazide stopped. Sodium better. #5 hypokalemia. Hydrochlorothiazide stopped. #6 metabolic syndrome #7 diabetes mellitus type 2 hemoglobin A1c 6.1 sliding scale coverage for now. #8 nausea vomiting could be secondary to the acute renal failure. Improved. #9 weakness physical therapy evaluation.   Code Status:     Code Status Orders        Start     Ordered   12/29/14 0001  Full code   Continuous     12/28/14 1152     Disposition Plan: Likely home once acute renal failure resolved.  Consultants:  Dr. Lorretta HarpSchneir vascular surgery  Dr. Luther RedoKollaru nephrology  Procedures:  IVC filter placement  Time spent: 24 minutes coordination of care  Alford HighlandWIETING, Fintan Grater  [facility]  [facility]

## 2014-12-31 NOTE — Plan of Care (Signed)
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 History of HTN and metabolic syndrome controlled by home meds. Moderate fall no bed alarm activated, hourly roudning performed. Goal: Other Discharge Outcomes/Goals Plan of Care Progress To Goal: Goal: Other Discharge Outcomes/Goals Outcome: Progressing Progressing toward goals: *Barriers to progression - inability to void *discharge plan - home   *pain control - no request for prn pain med this shift *hemodynamics - continues heparin drip *complications - inability to void *Diet - poor appetite, encouraging to eat *Activity - PT treating for strenghtening

## 2014-12-31 NOTE — Progress Notes (Signed)
Hd treatment tolerated without complications. Blood returned per policy; catheter lumens packed with heparin.  -Large clot in venous chamber.  0ml fluid removel

## 2014-12-31 NOTE — Progress Notes (Signed)
ANTICOAGULATION CONSULT NOTE - Follow Up Consult  Pharmacy Consult for Ryan [name], [place] Indication: DVT  No Known Allergies  Patient Measurements: Height: 5' 6.97" (170.1 cm) Weight: (!) 327 lb 6.1 oz (148.5 kg) IBW/kg (Calculated) : 66.03 Heparin Dosing Weight: 102 kg  Vital Signs: Temp: 98.4 F (36.9 C) (05/03 0527) Temp Source: Oral (05/03 0527) BP: 132/64 mmHg (05/03 0527) Pulse Rate: 84 (05/03 0527)  Labs:  Recent Labs  12/28/14 1541  12/29/14 0436  12/30/14 0009 12/30/14 0458 12/31/14 0505  HGB  --   < > 10.9*  --  9.5* 9.4* 9.5*  HCT  --   < > 32.3*  --  28.5* 27.9* 27.9*  PLT  --   < > 262  --  295 288 334  APTT 75.2*  --  116*  --   --   --   --   HEPARINUNFRC  --   --  0.78*  < > 0.51 0.56 0.59  CREATININE  --   < > 7.20*  --  9.29*  --  12.20*  < > = values in this interval not displayed.  Estimated Creatinine Clearance: 10.7 mL/min (by C-G formula based on Cr of 12.2).   Medications:  Scheduled:  . insulin aspart  0-10 Units Subcutaneous TID AC & HS  . metoprolol tartrate  25 mg Oral BID  . ondansetron (ZOFRAN) IV  4 mg Intravenous 6 times per day  . pantoprazole (PROTONIX) IV  40 mg Intravenous Q12H    Assessment: DVT s/p IVC filter and thrombolysis   Goal of Therapy:  Heparin level 0.3-0.7 units/ml Monitor platelets by anticoagulation protocol: Yes   Plan:  Continue heparin infusion at 2100 units/hr.  Continue to monitor H&H and platelets  Valentina GuChristy, Adonia Porada D 12/31/2014,12:04 PM

## 2014-12-31 NOTE — Care Management (Signed)
Heparin drip continues. Poor po intake. IV Filter replaced last week. Thrombectomy last week.   Nephrology consult. Acute renal failure possibly due to contrast exposure. Will start dialysis today. Gwenette GreetBrenda S Holland RN MSN Care Management [phone]

## 2014-12-31 NOTE — Plan of Care (Signed)
Problem: Discharge Progression Outcomes Goal: Other Discharge Outcomes/Goals Outcome: Not Progressing Plan of care progressing to goal for goals: Barriers to progression - inability to void discharge plan - home   pain control - no request for prn pain med this shift hemodynamics - continues heparin drip complications - inability to void Diet - poor appetite, encouraging to eat Activity - PT treating for strenghtening

## 2015-01-01 LAB — BASIC METABOLIC PANEL
Anion gap: 15 (ref 5–15)
BUN: 95 mg/dL — ABNORMAL HIGH (ref 6–20)
CO2: 20 mmol/L — ABNORMAL LOW (ref 22–32)
Calcium: 7.7 mg/dL — ABNORMAL LOW (ref 8.9–10.3)
Chloride: 99 mmol/L — ABNORMAL LOW (ref 101–111)
Creatinine, Ser: 13.77 mg/dL — ABNORMAL HIGH (ref 0.61–1.24)
GFR calc Af Amer: 4 mL/min — ABNORMAL LOW (ref >60)
GFR, EST NON AFRICAN AMERICAN: 4 mL/min — AB (ref >60)
GLUCOSE: 125 mg/dL — AB (ref 65–99)
POTASSIUM: 4 mmol/L (ref 3.5–5.1)
Sodium: 134 mmol/L — ABNORMAL LOW (ref 135–145)

## 2015-01-01 LAB — RENAL FUNCTION PANEL
ANION GAP: 15 (ref 5–15)
Albumin: 3 g/dL — ABNORMAL LOW (ref 3.5–5.0)
BUN: 87 mg/dL — AB (ref 6–20)
CHLORIDE: 100 mmol/L — AB (ref 101–111)
CO2: 19 mmol/L — ABNORMAL LOW (ref 22–32)
Calcium: 7.9 mg/dL — ABNORMAL LOW (ref 8.9–10.3)
Creatinine, Ser: 12.57 mg/dL — ABNORMAL HIGH (ref 0.61–1.24)
GFR calc Af Amer: 5 mL/min — ABNORMAL LOW (ref >60)
GFR calc non Af Amer: 4 mL/min — ABNORMAL LOW (ref >60)
Glucose, Bld: 121 mg/dL — ABNORMAL HIGH (ref 65–99)
PHOSPHORUS: 6.6 mg/dL — AB (ref 2.5–4.6)
POTASSIUM: 4.1 mmol/L (ref 3.5–5.1)
Sodium: 134 mmol/L — ABNORMAL LOW (ref 135–145)

## 2015-01-01 LAB — CBC
HCT: 27.6 % — ABNORMAL LOW (ref 40.0–52.0)
HEMATOCRIT: 29.4 % — AB (ref 40.0–52.0)
Hemoglobin: 9.7 g/dL — ABNORMAL LOW (ref 13.0–18.0)
Hemoglobin: 9.3 g/dL — ABNORMAL LOW (ref 13.0–18.0)
MCH: 29.8 pg (ref 26.0–34.0)
MCH: 30.5 pg (ref 26.0–34.0)
MCHC: 33.1 g/dL (ref 32.0–36.0)
MCHC: 33.7 g/dL (ref 32.0–36.0)
MCV: 89.8 fL (ref 80.0–100.0)
MCV: 90.3 fL (ref 80.0–100.0)
PLATELETS: 312 10*3/uL (ref 150–440)
Platelets: 317 10*3/uL (ref 150–440)
RBC: 3.27 MIL/uL — ABNORMAL LOW (ref 4.40–5.90)
RBC: 3.05 MIL/uL — ABNORMAL LOW (ref 4.40–5.90)
RDW: 14.5 % (ref 11.5–14.5)
RDW: 14.4 % (ref 11.5–14.5)
WBC: 9 10*3/uL (ref 3.8–10.6)
WBC: 8.3 10*3/uL (ref 3.8–10.6)

## 2015-01-01 LAB — HEPARIN LEVEL (UNFRACTIONATED)
Heparin Unfractionated: 0.31 IU/mL (ref 0.30–0.70)
Heparin Unfractionated: 0.43 IU/mL (ref 0.30–0.70)

## 2015-01-01 LAB — GLUCOSE, CAPILLARY
Glucose-Capillary: 125 mg/dL — ABNORMAL HIGH (ref 70–99)
Glucose-Capillary: 137 mg/dL — ABNORMAL HIGH (ref 70–99)
Glucose-Capillary: 120 mg/dL — ABNORMAL HIGH (ref 70–99)

## 2015-01-01 MED ORDER — SODIUM CHLORIDE 0.9 % IV SOLN: 100.0000 mL | INTRAVENOUS | Status: DC | PRN

## 2015-01-01 MED ORDER — APIXABAN 5 MG PO TABS
2.5000 mg | ORAL_TABLET | Freq: Two times a day (BID) | ORAL | Status: DC
  Administered 2015-01-01: 23:00:00 2.5 mg via ORAL
  Administered 2015-01-01: 17:00:00 via ORAL
  Filled 2015-01-01 (×3): qty 1

## 2015-01-01 MED ORDER — POLYETHYLENE GLYCOL 3350 17 G PO PACK
17.0000 g | PACK | Freq: Every day | ORAL | Status: DC
  Administered 2015-01-01 – 2015-01-04 (×4): 17 g via ORAL
  Filled 2015-01-01 (×9): qty 1

## 2015-01-01 MED ORDER — APIXABAN 5 MG PO TABS
10.0000 mg | ORAL_TABLET | Freq: Two times a day (BID) | ORAL | Status: DC
Start: 2015-01-01 — End: 2015-01-01

## 2015-01-01 NOTE — Progress Notes (Signed)
[facility]  ROUNDING NOTE   Subjective:   First hemodialysis treatment yesterday. Tolerated treatment well.  Seen and examined on second hemodialysis treatment. UF goal of 0.5 Creatinine continues to rise.  Complains of increasing lower extremity edema Is eating more since last HD treatment.   Objective:  Vital signs in last 24 hours:  Temp:  [98.2 F (36.8 C)-99.5 F (37.5 C)] 99.5 F (37.5 C) (05/04 0528) Pulse Rate:  [74-93] 81 (05/04 1130) Resp:  [20-21] 20 (05/04 1043) BP: (126-147)/(69-87) 144/87 mmHg (05/04 1055) SpO2:  [95 %-97 %] 95 % (05/04 0528) Weight:  [119.4 kg (263 lb 3.7 oz)-128.1 kg (282 lb 6.6 oz)] 128.1 kg (282 lb 6.6 oz) (05/04 1043)  Weight change:  Filed Weights   12/31/14 1848 12/31/14 2100 01/01/15 1043  Weight: 121 kg (266 lb 12.1 oz) 119.4 kg (263 lb 3.7 oz) 128.1 kg (282 lb 6.6 oz)    Intake/Output:     Intake/Output this shift:     General: not in acute distress Head: [place]/AT, PERRL, moist oral mucosal membranes Neck: supple, trachea midline CVS: regular rate and rhythm Resp: clear  ABD: soft, nontender, +obese EXT: 2+ edema Neuro: nonfocal Access: LIJ vascath Dr. Gilda Crease 5/3   Basic Metabolic Panel:  Recent Labs Lab 12/30/14 0009 12/31/14 0505 12/31/14 1900 12/31/14 2216 01/01/15 0517  NA 130* 134* 132* 134* 134*  K 3.9 4.2 4.5 4.1 4.0  CL 97* 101 100* 100* 99*  CO2 21* 21* 19* 19* 20*  GLUCOSE 101* 113* 102* 121* 125*  BUN 78* 94* 98* 87* 95*  CREATININE 9.29* 12.20* 13.05* 12.57* 13.77*  CALCIUM 7.7* 7.7* 7.7* 7.9* 7.7*  PHOS  --   --  7.3* 6.6*  --     Liver Function Tests:  Recent Labs Lab 12/31/14 1900 12/31/14 2216  ALBUMIN 2.8* 3.0*   No results for input(s): LIPASE, AMYLASE in the last 168 hours. No results for input(s): AMMONIA in the last 168 hours.  CBC:  Recent Labs Lab 12/26/14 0520 12/27/14 0515  12/28/14 0444  12/30/14 0458 12/31/14 0505 12/31/14 1807 12/31/14 2216  01/01/15 0517  WBC 7.2 6.1  --  11.3*  < > 7.8 6.3 7.0 9.0 8.3  NEUTROABS 4.5 3.6  --  7.7*  --   --   --   --   --   --   HGB 13.4 12.9*  < > 12.1*  < > 9.4* 9.5* 9.5* 9.7* 9.3*  HCT 40.1 38.9*  --  34.9*  < > 27.9* 27.9* 28.3* 29.4* 27.6*  MCV 90 90  --  90  < > 90.0 90.8 88.9 89.8 90.3  PLT 239 247  --  224  < > 288 334 335 312 317  < > = values in this interval not displayed.  Cardiac Enzymes: No results for input(s): CKTOTAL, CKMB, CKMBINDEX, TROPONINI in the last 168 hours.  BNP: Invalid input(s): POCBNP  CBG:  Recent Labs Lab 12/31/14 0748 12/31/14 1142 12/31/14 1633 12/31/14 2119 01/01/15 0730  GLUCAP 115* 112* 102* 95 125*    Microbiology: Results for orders placed or performed during the hospital encounter of 12/28/14  Culture, blood (single)     Status: None (Preliminary result)   Collection Time: 12/27/14  8:34 PM  Result Value Ref Range Status   Micro Text Report   Preliminary       COMMENT  NO GROWTH IN 36 HOURS   ANTIBIOTIC                                                      Culture, blood (single)     Status: None (Preliminary result)   Collection Time: 12/27/14  8:40 PM  Result Value Ref Range Status   Micro Text Report   Preliminary       COMMENT                   NO GROWTH IN 36 HOURS   ANTIBIOTIC                                                        Coagulation Studies: No results for input(s): LABPROT, INR in the last 72 hours.  Urinalysis: No results for input(s): COLORURINE, LABSPEC, PHURINE, GLUCOSEU, HGBUR, BILIRUBINUR, KETONESUR, PROTEINUR, UROBILINOGEN, NITRITE, LEUKOCYTESUR in the last 72 hours.  Invalid input(s): APPERANCEUR    Imaging: Dg Chest Port 1 View  12/31/2014   CLINICAL DATA:  44 year old male status post hemodialysis catheter placements  EXAM: PORTABLE CHEST - 1 VIEW  COMPARISON:  Prior chest x-ray 12/20/2014  FINDINGS: Interval placement of a left IJ approach non tunneled hemodialysis catheter.  Catheter tip projects over the mid right atrium. Very low inspiratory volumes. Mild pulmonary vascular congestion without overt edema. No evidence of pneumothorax.  IMPRESSION: The tip of the left IJ approach non tunneled hemodialysis catheter projects over the mid right atrium.   Electronically Signed   By: Malachy MoanHeath  McCullough M.D.   On: 12/31/2014 19:02   Dg Abd Portable 1v  12/31/2014   CLINICAL DATA:  Recent hemodialysis catheter placement  EXAM: PORTABLE ABDOMEN - 1 VIEW  COMPARISON:  Portable exam 1835 hr compared to CT exam of 12/23/2014  FINDINGS: No definite central line/dialysis catheter is identified.  IVC filter projects over lumbar spine.  Air-filled loops of air-filled nondilated large and small bowel.  Bones demineralized.  IMPRESSION: No definite dialysis catheter is visualized.   Electronically Signed   By: Ulyses SouthwardMark  Boles M.D.   On: 12/31/2014 19:00     Medications:   . heparin 2,100 Units/hr (01/01/15 1018)   . insulin aspart  0-10 Units Subcutaneous TID AC & HS  . LORazepam  1 mg Oral QHS  . metoprolol tartrate  25 mg Oral BID  . ondansetron (ZOFRAN) IV  4 mg Intravenous 6 times per day  . pantoprazole (PROTONIX) IV  40 mg Intravenous Q12H   sodium chloride, sodium chloride, sodium chloride, sodium chloride, acetaminophen, alum & mag hydroxide-simeth, docusate sodium, metoCLOPramide (REGLAN) injection, morphine injection, ondansetron (ZOFRAN) IV, simethicone, sodium chloride  Assessment/ Plan:  44 y.o. 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   1. Acute renal failure: [name]. Multiple contrast exposure this admission. Baseline creatinine of 0.99. 12/20/2014 - Creatinine and BUN continues to increase despite hemodialysis. First hemodialysis on 5/3 Now getting second dialysis treatment.   -  will monitor volume status,  urine output and renal function closely. Currently not recording urine output.  - Renally dose all medications. Avoid nephrotoxic agents.   2. Hypertension: well controlled. On metoprolol.   3. Hypercholesterolemia: not currently on a statin  4. Acute thrombosis I82.412: on heparin gtt. Appreciate vascular input.   5. Diabetes mellitus type II: E11.29 on metformin as outpatient. Currently on insulin. Glucose well controlled.    LOS: 4 Shandelle Borrelli 5/4/201611:51 AM

## 2015-01-01 NOTE — Progress Notes (Signed)
ANTICOAGULATION CONSULT NOTE - Follow Up Consult  Pharmacy Consult for Heparin Drip Indication: DVT  No Known Allergies  Patient Measurements: Height: 5' 6.97" (170.1 cm) Weight: 263 lb 3.7 oz (119.4 kg) IBW/kg (Calculated) : 66.03 Heparin Dosing Weight: 94 kg  Vital Signs: Temp: 99.5 F (37.5 C) (05/04 0528) Temp Source: Oral (05/04 0528) BP: 126/78 mmHg (05/04 0528) Pulse Rate: 92 (05/04 0528)  Labs:  Recent Labs  12/30/14 0458 12/31/14 0505 12/31/14 1807 12/31/14 1900 12/31/14 2216 01/01/15 0517  HGB 9.4* 9.5* 9.5*  --  9.7* 9.3*  HCT 27.9* 27.9* 28.3*  --  29.4* 27.6*  PLT 288 334 335  --  312 317  HEPARINUNFRC 0.56 0.59  --   --   --  0.31  CREATININE  --  12.20*  --  13.05* 12.57* 13.77*    Estimated Creatinine Clearance: 8.4 mL/min (by C-G formula based on Cr of 13.77).   Medications:  Scheduled:  . insulin aspart  0-10 Units Subcutaneous TID AC & HS  . LORazepam  1 mg Oral QHS  . metoprolol tartrate  25 mg Oral BID  . ondansetron (ZOFRAN) IV  4 mg Intravenous 6 times per day  . pantoprazole (PROTONIX) IV  40 mg Intravenous Q12H   Infusions:  . heparin 2,100 Units/hr (12/31/14 2154)    Assessment: DVT s/p IVC filter and thrombolysis   Goal of Therapy:  Heparin level 0.3-0.7 units/ml Monitor platelets by anticoagulation protocol: Yes  Plan:  Continue heparin infusion at 2100 units/hr. Heparin level has trended down. Will recheck level in 8 hrs based on Crcl. Continue to monitor H&H and platelets  Bari MantisKristin Mayco Walrond PharmD Clinical Pharmacist 01/01/2015

## 2015-01-01 NOTE — Plan of Care (Signed)
Problem: Discharge Progression Outcomes Goal: Hemodynamically stable Outcome: Progressing Patient had dialysis today tolerated well, axo no complaints, all dressing clean and intact.  Plan for dialysis tomorrow as well.

## 2015-01-01 NOTE — Progress Notes (Signed)
Physical Therapy Treatment Patient Details Name: Thea SilversmithCharles A Hemrick MRN: [id number] DOB: 1971/02/14 Today's Date: 01/01/2015    History of Present Illness 44 yo Male came to ED with acute DVT, edema and shortness of breath. Patient was started on Heparin drip  and on 4/28 underwent mechanical thrombectomy R/L venous system and IVC filter placement. Pateint has a PMH significant for right MCL tear. He also reports dizziness with positional change.    PT Comments    Initial attempt, pt out for hemodialysis. Second attempt, pt with MD. Pt seen briefly due to significant lethargy from hemodialysis. Pt educated on importance of exercise for strength and able to demonstrate with some assist; also educated on proper breathing with exercises. Pt has good understanding. Educated on positional changes in bed as well; pt understands.   Follow Up Recommendations  Home health PT     Equipment Recommendations       Recommendations for Other Services       Precautions / Restrictions Restrictions Weight Bearing Restrictions: No    Mobility  Bed Mobility Overal bed mobility: Needs Assistance Bed Mobility:  (Not tested)           General bed mobility comments: Instructed pt in positional changes every couple of hours (Not tested due to lethargy posted hemodialysis)  Transfers                    Ambulation/Gait                 Stairs            Wheelchair Mobility    Modified Rankin (Stroke Patients Only)       Balance                                    Cognition Arousal/Alertness: Lethargic Behavior During Therapy: WFL for tasks assessed/performed Overall Cognitive Status: Within Functional Limits for tasks assessed                      Exercises General Exercises - Lower Extremity Ankle Circles/Pumps: AROM;Both;20 reps;Supine Quad Sets: Strengthening;Both;20 reps;Supine Gluteal Sets: Strengthening;Both;20 reps;Supine Short Arc  Quad:  (Reviewed verbally) Heel Slides: AAROM;Both;10 reps (Limited range) Hip ABduction/ADduction: AAROM;Both;10 reps;Supine (Limited range) Other Exercises Other Exercises: Instructed pt in breathing with exercises    General Comments        Pertinent Vitals/Pain Pain Score:  (moderate) Pain Location: Abdomen/groin Pain Descriptors / Indicators: Heaviness (Tight) Pain Intervention(s): Limited activity within patient's tolerance    Home Living                      Prior Function            PT Goals (current goals can now be found in the care plan section) Progress towards PT goals: Progressing toward goals    Frequency  Min 2X/week    PT Plan Current plan remains appropriate    Co-evaluation             End of Session   Activity Tolerance: Patient limited by lethargy;Treatment limited secondary to medical complications (Comment)       Time: [id number] PT Time Calculation (min) (ACUTE ONLY): 15 min  Charges:  $Therapeutic Exercise: 8-22 mins                    G Codes:  Elsie StainHeidi Elizabeth Bishop 01/01/2015, 2:27 PM

## 2015-01-01 NOTE — Progress Notes (Signed)
Patient ID: Thea Silversmithharles A Bier, male   DOB: 25-Mar-1971, 44 y.o.   MRN: [id number] [facility] PRO[facility]ESS [facility]  DOA: 12/28/2014 PCP: No primary care provider on file.  HPI/Subjective: Patient's nausea vomiting has improved. He is feeling a little bit better. No abdominal pain. Urinating only very little. Positive for constipation.   Objective: Filed Vitals:   01/01/15 1240  BP: 151/82  Pulse: 82  Temp:   Resp:     Filed Weights   12/31/14 1848 12/31/14 2100 01/01/15 1043  Weight: 121 kg (266 lb 12.1 oz) 119.4 kg (263 lb 3.7 oz) 128.1 kg (282 lb 6.6 oz)    ROS: Review of Systems  Constitutional: Negative for fever and chills.  Eyes: Negative for blurred vision.  Respiratory: Negative for cough and shortness of breath.   Cardiovascular: Negative for chest pain.  Gastrointestinal: Positive for constipation. Negative for nausea, vomiting and diarrhea.  Genitourinary: Negative for dysuria.  Musculoskeletal: Negative for joint pain.  Neurological: Negative for dizziness and headaches.   Exam: Physical Exam  HENT:  Nose: No mucosal edema.  Mouth/Throat: No oropharyngeal exudate or posterior oropharyngeal edema.  Eyes: Conjunctivae, EOM and lids are normal. Pupils are equal, round, and reactive to light.  Neck: No JVD present. Carotid bruit is not present. No edema present. No thyroid mass and no thyromegaly present.  Cardiovascular: S1 normal and S2 normal.  Exam reveals no gallop.   No murmur heard. Pulses:      Dorsalis pedis pulses are 1+ on the right side, and 1+ on the left side.  Respiratory: No respiratory distress. He has no wheezes. He has no rhonchi. He has no rales.  GI: Soft. Bowel sounds are normal. There is no tenderness.  Musculoskeletal:       Right shoulder: He exhibits no swelling.       Right ankle: He exhibits swelling.       Left ankle: He exhibits swelling.  Lymphadenopathy:    He has no cervical adenopathy.  Neurological: He is alert. No  cranial nerve deficit.  Skin: Skin is warm. No rash noted. Nails show no clubbing.  Psychiatric: He has a normal mood and affect.    Data Reviewed: Basic Metabolic Panel:  Recent Labs Lab 12/30/14 0009 12/31/14 0505 12/31/14 1900 12/31/14 2216 01/01/15 0517  NA 130* 134* 132* 134* 134*  K 3.9 4.2 4.5 4.1 4.0  CL 97* 101 100* 100* 99*  CO2 21* 21* 19* 19* 20*  GLUCOSE 101* 113* 102* 121* 125*  BUN 78* 94* 98* 87* 95*  CREATININE 9.29* 12.20* 13.05* 12.57* 13.77*  CALCIUM 7.7* 7.7* 7.7* 7.9* 7.7*  PHOS  --   --  7.3* 6.6*  --     CBC:  Recent Labs Lab 12/26/14 0520 12/27/14 0515  12/28/14 0444  12/30/14 0458 12/31/14 0505 12/31/14 1807 12/31/14 2216 01/01/15 0517  WBC 7.2 6.1  --  11.3*  < > 7.8 6.3 7.0 9.0 8.3  NEUTROABS 4.5 3.6  --  7.7*  --   --   --   --   --   --   HGB 13.4 12.9*  < > 12.1*  < > 9.4* 9.5* 9.5* 9.7* 9.3*  HCT 40.1 38.9*  --  34.9*  < > 27.9* 27.9* 28.3* 29.4* 27.6*  MCV 90 90  --  90  < > 90.0 90.8 88.9 89.8 90.3  PLT 239 247  --  224  < > 288 334 335 312 317  < > =  values in this interval not displayed.   CBG:  Recent Labs Lab 12/31/14 0748 12/31/14 1142 12/31/14 1633 12/31/14 2119 01/01/15 0730  GLUCAP 115* 112* 102* 95 125*       Studies: Dg Chest Port 1 View  12/31/2014   CLINICAL DATA:  44 year old male status post hemodialysis catheter placements  EXAM: PORTABLE CHEST - 1 VIEW  COMPARISON:  Prior chest x-ray 12/20/2014  FINDINGS: Interval placement of a left IJ approach non tunneled hemodialysis catheter. Catheter tip projects over the mid right atrium. Very low inspiratory volumes. Mild pulmonary vascular congestion without overt edema. No evidence of pneumothorax.  IMPRESSION: The tip of the left IJ approach non tunneled hemodialysis catheter projects over the mid right atrium.   Electronically Signed   By: Malachy MoanHeath  McCullough M.D.   On: 12/31/2014 19:02   Dg Abd Portable 1v  12/31/2014   CLINICAL DATA:  Recent hemodialysis  catheter placement  EXAM: PORTABLE ABDOMEN - 1 VIEW  COMPARISON:  Portable exam 1835 hr compared to CT exam of 12/23/2014  FINDINGS: No definite central line/dialysis catheter is identified.  IVC filter projects over lumbar spine.  Air-filled loops of air-filled nondilated large and small bowel.  Bones demineralized.  IMPRESSION: No definite dialysis catheter is visualized.   Electronically Signed   By: Ulyses SouthwardMark  Boles M.D.   On: 12/31/2014 19:00    Scheduled Meds: . apixaban  10 mg Oral BID  . insulin aspart  0-10 Units Subcutaneous TID AC & HS  . LORazepam  1 mg Oral QHS  . metoprolol tartrate  25 mg Oral BID  . ondansetron (ZOFRAN) IV  4 mg Intravenous 6 times per day  . pantoprazole (PROTONIX) IV  40 mg Intravenous Q12H   Continuous Infusions:   Assessment/Plan: Active Problems:   Acute DVT (deep venous thrombosis)   1. Acute renal failure, likely ATN with contrast exposure. -Patient started dialysis last night and again today. -Creatinine still very impaired. -Nausea vomiting has improved 2. Extensive DVT -Status post thrombolysis and IVC filter placement. -We will DC heparin drip. And will start DVT dose Eliquis. 3. Essential hypertension - continue metoprolol. 4. Type 2 diabetes controlled continue sliding scale. 5. Gastroesophageal reflux disease without esophagitis on IV Protonix. 6. Constipation-patient will start MiraLAX.  Code Status:     Code Status Orders        Start     Ordered   12/29/14 0001  Full code   Continuous     12/28/14 1152     Family Communication: Patient gave me permission to speak medically in front of people at the bedside today.  Disposition Plan: Home once kidney function improves  Consultants:  Nephrology  Procedures:  IVC filter and thrombolysis  Dialysis catheter left neck  Time spent: 25 minutes, case discussed with Dr. Sheryle Hailolarusso nephrology.   Alford HighlandWIETING, Sevilla Murtagh  [facility]

## 2015-01-02 LAB — RENAL FUNCTION PANEL
Albumin: 2.9 g/dL — ABNORMAL LOW (ref 3.5–5.0)
Anion gap: 13 (ref 5–15)
BUN: 93 mg/dL — AB (ref 6–20)
CALCIUM: 7.9 mg/dL — AB (ref 8.9–10.3)
CO2: 24 mmol/L (ref 22–32)
Chloride: 99 mmol/L — ABNORMAL LOW (ref 101–111)
Creatinine, Ser: 14.3 mg/dL — ABNORMAL HIGH (ref 0.61–1.24)
GFR calc Af Amer: 4 mL/min — ABNORMAL LOW (ref >60)
GFR calc non Af Amer: 4 mL/min — ABNORMAL LOW (ref >60)
GLUCOSE: 108 mg/dL — AB (ref 65–99)
Phosphorus: 7.3 mg/dL — ABNORMAL HIGH (ref 2.5–4.6)
Potassium: 4.4 mmol/L (ref 3.5–5.1)
SODIUM: 136 mmol/L (ref 135–145)

## 2015-01-02 LAB — GLUCOSE, CAPILLARY
Glucose-Capillary: 123 mg/dL — ABNORMAL HIGH (ref 70–99)
Glucose-Capillary: 125 mg/dL — ABNORMAL HIGH (ref 70–99)
Glucose-Capillary: 117 mg/dL — ABNORMAL HIGH (ref 70–99)

## 2015-01-02 LAB — MISC LABCORP TEST (SEND OUT): Labcorp test code: 6510

## 2015-01-02 LAB — CBC
HEMATOCRIT: 26.5 % — AB (ref 40.0–52.0)
Hemoglobin: 8.7 g/dL — ABNORMAL LOW (ref 13.0–18.0)
MCH: 29.7 pg (ref 26.0–34.0)
MCHC: 32.9 g/dL (ref 32.0–36.0)
MCV: 90.4 fL (ref 80.0–100.0)
Platelets: 279 10*3/uL (ref 150–440)
RBC: 2.93 MIL/uL — ABNORMAL LOW (ref 4.40–5.90)
RDW: 14.4 % (ref 11.5–14.5)
WBC: 7.7 10*3/uL (ref 3.8–10.6)

## 2015-01-02 MED ORDER — APIXABAN 5 MG PO TABS
2.5000 mg | ORAL_TABLET | Freq: Two times a day (BID) | ORAL | Status: DC
  Administered 2015-01-03 – 2015-01-04 (×3): 5 mg via ORAL
  Administered 2015-01-04 – 2015-01-08 (×9): 2.5 mg via ORAL
  Filled 2015-01-02 (×13): qty 1

## 2015-01-02 MED ORDER — PANTOPRAZOLE SODIUM 40 MG PO TBEC
40.0000 mg | DELAYED_RELEASE_TABLET | Freq: Two times a day (BID) | ORAL | Status: DC
  Administered 2015-01-02 – 2015-01-10 (×16): 40 mg via ORAL
  Filled 2015-01-02 (×17): qty 1

## 2015-01-02 NOTE — Progress Notes (Signed)
[facility] [place] [facility]   PATIENT NAME: Ryan [name]    MR#:  [id number]  DATE OF BIRTH:  01-15-1971  SUBJECTIVE:  CHIEF COMPLAINT:  No chief complaint on file.  Worried about his left neck dialysis catheter. No pain. Significant other at bedside. Had 3rd dialysis session today.  REVIEW OF SYSTEMS:   CONSTITUTIONAL: Obese patient, No fever, fatigue or weakness.  EYES: No blurred or double vision.  EARS, NOSE, AND THROAT: No tinnitus or ear pain.  RESPIRATORY: No cough, shortness of breath, wheezing or hemoptysis.  CARDIOVASCULAR: No chest pain, orthopnea, edema.  GASTROINTESTINAL: No nausea, vomiting, diarrhea or abdominal pain.  GENITOURINARY: No dysuria, hematuria.  ENDOCRINE: No polyuria, nocturia,  HEMATOLOGY: No anemia, easy bruising or bleeding SKIN: No rash or lesion. MUSCULOSKELETAL: No joint pain or arthritis.   NEUROLOGIC: No tingling, numbness, weakness.  PSYCHIATRY: No anxiety or depression.   DRUG ALLERGIES:  No Known Allergies  VITALS:  Blood pressure 152/89, pulse 85, temperature 98.4 F (36.9 C), temperature source Axillary, resp. rate 17, height 5' 6.97" (1.701 m), weight 121.5 kg (267 lb 13.7 oz), SpO2 94 %.  PHYSICAL EXAMINATION:   GENERAL:  44 y.o.-year-old obese patient lying in the bed with no acute distress.  EYES: Pupils equal, round, reactive to light and accommodation. No scleral icterus. Extraocular muscles intact.  HEENT: Head atraumatic, normocephalic. Oropharynx and nasopharynx clear.  NECK:  Supple, no jugular venous distention. No thyroid enlargement, no tenderness.  LUNGS: Normal breath sounds bilaterally, no wheezing, rales,rhonchi or crepitation. No use of accessory muscles of respiration.  CARDIOVASCULAR: S1, S2 normal. No murmurs, rubs, or gallops.  ABDOMEN: Soft, nontender, nondistended. Bowel sounds present. No organomegaly or mass.  EXTREMITIES: 2+ pedal edema with chronic skin changes, cyanosis, or  clubbing.  NEUROLOGIC: Cranial nerves II through XII are intact. Muscle strength 5/5 in all extremities. Sensation intact. Gait not checked.  PSYCHIATRIC: The patient is alert and oriented x 3.  SKIN: No obvious rash, lesion, or ulcer.    LABORATORY PANEL:   CBC  Recent Labs Lab 01/02/15 0930  WBC 7.7  HGB 8.7*  HCT 26.5*  PLT 279   ------------------------------------------------------------------------------------------------------------------  Chemistries   Recent Labs Lab 01/02/15 0930  NA 136  K 4.4  CL 99*  CO2 24  GLUCOSE 108*  BUN 93*  CREATININE 14.30*  CALCIUM 7.9*   ------------------------------------------------------------------------------------------------------------------  Cardiac Enzymes No results for input(s): TROPONINI in the last 168 hours. ------------------------------------------------------------------------------------------------------------------  RADIOLOGY:  Dg Chest Port 1 View  12/31/2014   CLINICAL DATA:  44 year old male status post hemodialysis catheter placements  EXAM: PORTABLE CHEST - 1 VIEW  COMPARISON:  Prior chest x-ray 12/20/2014  FINDINGS: Interval placement of a left IJ approach non tunneled hemodialysis catheter. Catheter tip projects over the mid right atrium. Very low inspiratory volumes. Mild pulmonary vascular congestion without overt edema. No evidence of pneumothorax.  IMPRESSION: The tip of the left IJ approach non tunneled hemodialysis catheter projects over the mid right atrium.   Electronically Signed   By: Malachy MoanHeath  McCullough M.D.   On: 12/31/2014 19:02   Dg Abd Portable 1v  12/31/2014   CLINICAL DATA:  Recent hemodialysis catheter placement  EXAM: PORTABLE ABDOMEN - 1 VIEW  COMPARISON:  Portable exam 1835 hr compared to CT exam of 12/23/2014  FINDINGS: No definite central line/dialysis catheter is identified.  IVC filter projects over lumbar spine.  Air-filled loops of air-filled nondilated large and small bowel.   Bones demineralized.  IMPRESSION: No definite dialysis catheter is visualized.   Electronically Signed   By: Ulyses SouthwardMark  Boles M.D.   On: 12/31/2014 19:00    EKG:  No orders found for this or any previous visit.  ASSESSMENT AND PLAN:   44 y.o. 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   1. Acute renal failure: [name]. Multiple contrast exposure this admission. Baseline creatinine of 0.99. 12/20/2014 - Received third dialysis trt today, BUN/cr still rising. Decreased urine output for now. - cont prn Dialysis. Appreciate nephrology consult.  - Renally dose all medications. Avoid nephrotoxic agents.   2. Acute thrombosis I82.412: off heparin gtt. Appreciate vascular input. S/p IVC filter. on apixaban - renally dosed.  3. Hypertension: well controlled. On metoprolol.   4. Hypercholesterolemia: not currently on a statin   5. Diabetes mellitus type II: E11.29 on metformin as outpatient. Currently on insulin. Glucose well controlled.   6. GERD- protonix BID     All the records are reviewed and case discussed with Care Management/Social Workerr. Management plans discussed with the patient, family and they are in agreement.  CODE STATUS: FULL CODE  TOTAL TIME TAKING CARE OF THIS PATIENT: 36 minutes.   POSSIBLE D/C IN 5 DAYS, DEPENDING ON CLINICAL CONDITION.   Enid BaasKALISETTI,Muscab Brenneman M.D on 01/02/2015 at 2:09 PM  Between 7am to 6pm - Pager - [phone]  After 6pm go to [email or web address] - password [email or web address] [facility]  [facility]  Office  [phone]  CC: Primary care physician; No primary care provider on file.

## 2015-01-02 NOTE — Progress Notes (Signed)
PRE HD   

## 2015-01-02 NOTE — Plan of Care (Signed)
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 History of HTN and metabolic syndrome controlled by home meds. Moderate fall no bed alarm activated, hourly roudning performed.

## 2015-01-02 NOTE — Progress Notes (Signed)
S:  Sleeping, notes his legs are still quite swollen and having difficulty standing and walking.  He is concerned he has sleep apnea  O:  AF VSS       Left IJ temp cath intact       Rt IJ access site uninfected       Bilateral lower extremities with 3+ edema and severe venous changes  A:  Acute renal failure      DVT s/p thrombolysis  P:  Continue dialysis will use right IJ if permacath is needed       Need PT having difficult time with ambulatin and spending too much time in bed       Will investigate if preliminary evaluation for sleep apnea is possible.

## 2015-01-02 NOTE — Progress Notes (Signed)
Physical Therapy Treatment Patient Details Name: Ryan SilversmithCharles A Gilcrease MRN: [id number] DOB: 11-02-1970 Today's Date: 01/02/2015    History of Present Illness 44 yo Male came to ED with acute DVT, edema and shortness of breath. Patient was started on Heparin drip  and on 4/28 underwent mechanical thrombectomy R/L venous system and IVC filter placement. Pateint has a PMH significant for right MCL tear. He also reports dizziness with positional change.    PT Comments    Pt with improved ability to participate in PT today. Mild dizziness initially with sitting; improved with continued sit edge of bed. Pt unable to stand with standard rolling walker due to wide base of support. Able to stand moving bed close to counter for 1.5 minutes. Fatigues quickly.   Follow Up Recommendations  Home health PT     Equipment Recommendations   (Bariatric front rolling walker)    Recommendations for Other Services       Precautions / Restrictions Precautions Precautions: None Restrictions Weight Bearing Restrictions: No Other Position/Activity Restrictions: FWB    Mobility  Bed Mobility Overal bed mobility: Needs Assistance Bed Mobility: Supine to Sit;Sit to Supine     Supine to sit: Min assist Sit to supine: Mod assist      Transfers Overall transfer level: Needs assistance Equipment used:  (Moved bed close enough to stand at counter) Transfers: Sit to/from Stand Sit to Stand: Min guard         General transfer comment:  (Moved bed close to stand at counter; needs bariatric rw)  Ambulation/Gait                 Stairs            Wheelchair Mobility    Modified Rankin (Stroke Patients Only)       Balance Overall balance assessment: Modified Independent Sitting-balance support: Single extremity supported;Feet supported (Due to fatigue) Sitting balance-Leahy Scale: Fair     Standing balance support: Bilateral upper extremity supported Standing balance-Leahy Scale:  Poor Standing balance comment:  (Leans on counter, significant trunk flexion, very wide BOS)                    Cognition Arousal/Alertness: Awake/alert (Fatigued) Behavior During Therapy: WFL for tasks assessed/performed Overall Cognitive Status: Within Functional Limits for tasks assessed                      Exercises General Exercises - Lower Extremity Ankle Circles/Pumps: AROM;Both;20 reps;Supine Quad Sets: Strengthening;Both;20 reps;Supine Gluteal Sets: Strengthening;Both;20 reps;Supine Short Arc Quad: AROM;Both;20 reps Long Arc Quad: AROM;Both;20 reps;Seated Heel Slides: AAROM;Both;20 reps Hip ABduction/ADduction: AAROM;Both;Supine;20 reps    General Comments General comments (skin integrity, edema, etc.):  (Reports itchy, mild scratches. Spouse to notify nsg)      Pertinent Vitals/Pain Pain Location: B low abdomen Pain Descriptors / Indicators: Constant;Dull;Grimacing;Heaviness;Pressure Pain Intervention(s): Limited activity within patient's tolerance    Home Living                      Prior Function            PT Goals (current goals can now be found in the care plan section) Progress towards PT goals: Progressing toward goals    Frequency  Min 2X/week    PT Plan Current plan remains appropriate    Co-evaluation             End of Session   Activity Tolerance: Patient limited by fatigue  Patient left: in bed;with call bell/phone within reach;with bed alarm set;with nursing/sitter in room     Time: 1417-1444 PT Time Calculation (min) (ACUTE ONLY): 27 min  Charges:  $Therapeutic Exercise: 8-22 mins $Therapeutic Activity: 8-22 mins                    G Codes:      Kristeen MissHeidi Elizabeth Bishop 01/02/2015, 3:02 PM

## 2015-01-02 NOTE — Progress Notes (Signed)
HD START 

## 2015-01-02 NOTE — Progress Notes (Signed)
[facility]  ROUNDING NOTE   Subjective:   Seen and examined on hemodialysis. Tolerating treatment well. This is third treatment. UF goal of 1 litre.  Patient reports he is not urinating much.   Objective:  Vital signs in last 24 hours:  Temp:  [98.4 F (36.9 C)-99 F (37.2 C)] 98.4 F (36.9 C) (05/05 0917) Pulse Rate:  [79-92] 80 (05/05 1100) Resp:  [15-18] 18 (05/05 1100) BP: (133-154)/(72-92) 147/89 mmHg (05/05 1100) SpO2:  [93 %-96 %] 94 % (05/05 1100)  Weight change: 7.1 kg (15 lb 10.4 oz) Filed Weights   12/31/14 1848 12/31/14 2100 01/01/15 1043  Weight: 121 kg (266 lb 12.1 oz) 119.4 kg (263 lb 3.7 oz) 128.1 kg (282 lb 6.6 oz)    Intake/Output: I/O last 3 completed shifts: In: 402.9 [I.V.:402.9] Out: 0    Intake/Output this shift:     General: not in acute distress Head: [place]/AT, PERRL, moist oral mucosal membranes Neck: supple, trachea midline CVS: regular rate and rhythm Resp: clear  ABD: soft, nontender, +obese EXT: 2+ edema Neuro: nonfocal Access: LIJ vascath Dr. Gilda CreaseSchnier 5/3   Basic Metabolic Panel:  Recent Labs Lab 12/30/14 0009 12/31/14 0505 12/31/14 1900 12/31/14 2216 01/01/15 0517  NA 130* 134* 132* 134* 134*  K 3.9 4.2 4.5 4.1 4.0  CL 97* 101 100* 100* 99*  CO2 21* 21* 19* 19* 20*  GLUCOSE 101* 113* 102* 121* 125*  BUN 78* 94* 98* 87* 95*  CREATININE 9.29* 12.20* 13.05* 12.57* 13.77*  CALCIUM 7.7* 7.7* 7.7* 7.9* 7.7*  PHOS  --   --  7.3* 6.6*  --     Liver Function Tests:  Recent Labs Lab 12/31/14 1900 12/31/14 2216  ALBUMIN 2.8* 3.0*   No results for input(s): LIPASE, AMYLASE in the last 168 hours. No results for input(s): AMMONIA in the last 168 hours.  CBC:  Recent Labs Lab 12/27/14 0515  12/28/14 0444  12/30/14 0458 12/31/14 0505 12/31/14 1807 12/31/14 2216 01/01/15 0517  WBC 6.1  --  11.3*  < > 7.8 6.3 7.0 9.0 8.3  NEUTROABS 3.6  --  7.7*  --   --   --   --   --   --   HGB 12.9*  < > 12.1*  < >  9.4* 9.5* 9.5* 9.7* 9.3*  HCT 38.9*  --  34.9*  < > 27.9* 27.9* 28.3* 29.4* 27.6*  MCV 90  --  90  < > 90.0 90.8 88.9 89.8 90.3  PLT 247  --  224  < > 288 334 335 312 317  < > = values in this interval not displayed.  Cardiac Enzymes: No results for input(s): CKTOTAL, CKMB, CKMBINDEX, TROPONINI in the last 168 hours.  BNP: Invalid input(s): POCBNP  CBG:  Recent Labs Lab 12/31/14 2119 01/01/15 0730 01/01/15 1621 01/01/15 2212 01/02/15 0729  GLUCAP 95 125* 137* 120* 123*    Microbiology: Results for orders placed or performed during the hospital encounter of 12/28/14  Culture, blood (single)     Status: None (Preliminary result)   Collection Time: 12/27/14  8:34 PM  Result Value Ref Range Status   Micro Text Report   Preliminary       COMMENT                   NO GROWTH IN 36 HOURS   ANTIBIOTIC  Culture, blood (single)     Status: None (Preliminary result)   Collection Time: 12/27/14  8:40 PM  Result Value Ref Range Status   Micro Text Report   Preliminary       COMMENT                   NO GROWTH IN 36 HOURS   ANTIBIOTIC                                                        Coagulation Studies: No results for input(s): LABPROT, INR in the last 72 hours.  Urinalysis: No results for input(s): COLORURINE, LABSPEC, PHURINE, GLUCOSEU, HGBUR, BILIRUBINUR, KETONESUR, PROTEINUR, UROBILINOGEN, NITRITE, LEUKOCYTESUR in the last 72 hours.  Invalid input(s): APPERANCEUR    Imaging: Dg Chest Port 1 View  12/31/2014   CLINICAL DATA:  44 year old male status post hemodialysis catheter placements  EXAM: PORTABLE CHEST - 1 VIEW  COMPARISON:  Prior chest x-ray 12/20/2014  FINDINGS: Interval placement of a left IJ approach non tunneled hemodialysis catheter. Catheter tip projects over the mid right atrium. Very low inspiratory volumes. Mild pulmonary vascular congestion without overt edema. No evidence of pneumothorax.   IMPRESSION: The tip of the left IJ approach non tunneled hemodialysis catheter projects over the mid right atrium.   Electronically Signed   By: Malachy MoanHeath  McCullough M.D.   On: 12/31/2014 19:02   Dg Abd Portable 1v  12/31/2014   CLINICAL DATA:  Recent hemodialysis catheter placement  EXAM: PORTABLE ABDOMEN - 1 VIEW  COMPARISON:  Portable exam 1835 hr compared to CT exam of 12/23/2014  FINDINGS: No definite central line/dialysis catheter is identified.  IVC filter projects over lumbar spine.  Air-filled loops of air-filled nondilated large and small bowel.  Bones demineralized.  IMPRESSION: No definite dialysis catheter is visualized.   Electronically Signed   By: Ulyses SouthwardMark  Boles M.D.   On: 12/31/2014 19:00     Medications:     . apixaban  2.5 mg Oral BID  . insulin aspart  0-10 Units Subcutaneous TID AC & HS  . LORazepam  1 mg Oral QHS  . metoprolol tartrate  25 mg Oral BID  . ondansetron (ZOFRAN) IV  4 mg Intravenous 6 times per day  . pantoprazole (PROTONIX) IV  40 mg Intravenous Q12H  . polyethylene glycol  17 g Oral Daily   sodium chloride, sodium chloride, sodium chloride, sodium chloride, sodium chloride, sodium chloride, acetaminophen, alum & mag hydroxide-simeth, docusate sodium, metoCLOPramide (REGLAN) injection, morphine injection, ondansetron (ZOFRAN) IV, simethicone, sodium chloride  Assessment/ Plan:  44 y.o. 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   1. Acute renal failure: [name]. Multiple contrast exposure this admission. Baseline creatinine of 0.99. 12/20/2014 - Creatinine and BUN continues to increase despite hemodialysis. First hemodialysis on 5/3 Now getting third dialysis treatment.   - will monitor volume status, urine output and renal function closely. Currently not recording urine output.   - Renally dose all medications. Avoid nephrotoxic agents.   2. Hypertension: well controlled. On metoprolol.   3. Hypercholesterolemia: not currently on a statin  4. Acute thrombosis I82.412: off heparin gtt. Appreciate  vascular input. Started on apixaban - renally dosed.   5. Diabetes mellitus type II: E11.29 on metformin as outpatient. Currently on insulin. Glucose well controlled.    LOS: 5 Avion Patella 5/5/201611:27 AM

## 2015-01-03 LAB — RENAL FUNCTION PANEL
Albumin: 2.9 g/dL — ABNORMAL LOW (ref 3.5–5.0)
Anion gap: 10 (ref 5–15)
BUN: 66 mg/dL — ABNORMAL HIGH (ref 6–20)
CHLORIDE: 100 mmol/L — AB (ref 101–111)
CO2: 28 mmol/L (ref 22–32)
Calcium: 8 mg/dL — ABNORMAL LOW (ref 8.9–10.3)
Creatinine, Ser: 12.1 mg/dL — ABNORMAL HIGH (ref 0.61–1.24)
GFR calc Af Amer: 5 mL/min — ABNORMAL LOW (ref >60)
GFR calc non Af Amer: 4 mL/min — ABNORMAL LOW (ref >60)
Glucose, Bld: 113 mg/dL — ABNORMAL HIGH (ref 65–99)
POTASSIUM: 4.4 mmol/L (ref 3.5–5.1)
Phosphorus: 6.2 mg/dL — ABNORMAL HIGH (ref 2.5–4.6)
SODIUM: 138 mmol/L (ref 135–145)

## 2015-01-03 LAB — CBC
HEMATOCRIT: 26.8 % — AB (ref 40.0–52.0)
HEMOGLOBIN: 8.8 g/dL — AB (ref 13.0–18.0)
MCH: 29.7 pg (ref 26.0–34.0)
MCHC: 32.9 g/dL (ref 32.0–36.0)
MCV: 90 fL (ref 80.0–100.0)
Platelets: 299 10*3/uL (ref 150–440)
RBC: 2.98 MIL/uL — ABNORMAL LOW (ref 4.40–5.90)
RDW: 14.2 % (ref 11.5–14.5)
WBC: 7.6 10*3/uL (ref 3.8–10.6)

## 2015-01-03 LAB — GLUCOSE, CAPILLARY
Glucose-Capillary: 118 mg/dL — ABNORMAL HIGH (ref 70–99)
Glucose-Capillary: 109 mg/dL — ABNORMAL HIGH (ref 70–99)
Glucose-Capillary: 109 mg/dL — ABNORMAL HIGH (ref 70–99)
Glucose-Capillary: 120 mg/dL — ABNORMAL HIGH (ref 70–99)

## 2015-01-03 MED ORDER — NEPRO/CARBSTEADY PO LIQD
237.0000 mL | Freq: Two times a day (BID) | ORAL | Status: DC
  Administered 2015-01-03 – 2015-01-13 (×12): 237 mL via ORAL

## 2015-01-03 MED ORDER — SODIUM CHLORIDE 0.9 % IV SOLN: 100.0000 mL | INTRAVENOUS | Status: DC | PRN

## 2015-01-03 NOTE — Progress Notes (Signed)
HD tx completed.

## 2015-01-03 NOTE — Progress Notes (Signed)
Nutrition Follow-up  DOCUMENTATION CODES:     INTERVENTION:  Meals and snacks: Cater to pt prefences Medical Nutritional Supplement: Recommend adding nepro BID for added nutrition, Dr Wynelle LinkKolluru agreeable.   Coordination of care: Discussed with Dr. Wynelle LinkKolluru pt not meeting nutritional needs for 6 days of admssion.  Continuing current nutrition poc at this time.   NUTRITION DIAGNOSIS:  Inadequate oral intake related to acute illness as evidenced by poor po intake eating, ongoing.    GOAL:  Patient will meet greater than or equal to 90% of their needs    MONITOR:  Meals and snacks Electrolyte and renal profile  REASON FOR ASSESSMENT:  Follow-up    ASSESSMENT:  Pt with ARF, has received 4 HD sessions during admission, dialysis again today.   Pt reports ate french toast, coffee this am, no lunch eaten today, full tray at bedside.  Per I and O sheet intake < 25% of meals  Labs: Electrolyte and Renal Profile:    Recent Labs Lab 12/31/14 2216 01/01/15 0517 01/02/15 0930 01/03/15 0027  BUN 87* 95* 93* 66*  CREATININE 12.57* 13.77* 14.30* 12.10*  NA 134* 134* 136 138  K 4.1 4.0 4.4 4.4  PHOS 6.6*  --  7.3* 6.2*   Glucose Profile:  Recent Labs  01/02/15 2157 01/03/15 0754 01/03/15 1239  GLUCAP 117* 118* 109*   Medications: reglan, colace, mylicon, miralax  Height:  Ht Readings from Last 1 Encounters:  12/27/14 5' 6.97" (1.701 m)    Weight:  Wt Readings from Last 1 Encounters:  01/03/15 275 lb 2.2 oz (124.8 kg)      Wt Readings from Last 10 Encounters:  01/03/15 275 lb 2.2 oz (124.8 kg)    BMI:  Body mass index is 43.13 kg/(m^2).  Estimated Nutritional Needs:  Kcal:  1816-2179 kcal/day (BEE: 1513 x 1.2 AF x 1.0-1.2 IF)- using IBW  Protein:  80-94 g Pro/day (1.2-1.4 g/ kg IBW/ day)- using IBW  Fluid:  1675-2010 ml/ day (25-30 ml/ kg IBW)- using IBW  Skin:  Reviewed, no issues  Diet Order:  Diet renal with fluid restriction Fluid  restriction:: 1200 mL Fluid; Room service appropriate?: Yes; Fluid consistency:: Thin  EDUCATION NEEDS:  No education needs identified at this time   Intake/Output Summary (Last 24 hours) at 01/03/15 1540 Last data filed at 01/03/15 1227  Gross per 24 hour  Intake      0 ml  Output   1500 ml  Net  -1500 ml    Last BM:  5/3  High level of care  Ray Glacken B. Freida BusmanAllen, RD, LDN [phone] (pager)

## 2015-01-03 NOTE — Plan of Care (Signed)
Problem: Discharge Progression Outcomes Goal: Discharge plan in place and appropriate Individualization  Individualization of Care:   Patient prefers to be called Ryan [name]. 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 History of HTN and metabolic syndrome controlled by home meds. Moderate fall risk - no bed alarm activated, hourly rounding performed.

## 2015-01-03 NOTE — Progress Notes (Signed)
HD tx start 

## 2015-01-03 NOTE — Progress Notes (Signed)
Pre-hd tx 

## 2015-01-03 NOTE — Progress Notes (Signed)
[facility] [place] [facility]   PATIENT NAME: Jena GaussCharles Chap    MR#:  [id number]  DATE OF BIRTH:  1971/02/14  SUBJECTIVE:  CHIEF COMPLAINT:  No chief complaint on file.  For dialysis again today- day 4 in a row. Urine output improving now and also cr down a little. Feels ok.  REVIEW OF SYSTEMS:   CONSTITUTIONAL: Obese patient, No fever, fatigue or weakness.  EYES: No blurred or double vision.  EARS, NOSE, AND THROAT: No tinnitus or ear pain.  RESPIRATORY: No cough, shortness of breath, wheezing or hemoptysis.  CARDIOVASCULAR: No chest pain, orthopnea, edema.  GASTROINTESTINAL: No nausea, vomiting, diarrhea or abdominal pain.  GENITOURINARY: No dysuria, hematuria.  ENDOCRINE: No polyuria, nocturia,  HEMATOLOGY: No anemia, easy bruising or bleeding SKIN: No rash or lesion. MUSCULOSKELETAL: No joint pain or arthritis.   NEUROLOGIC: No tingling, numbness, weakness.  PSYCHIATRY: No anxiety or depression.   DRUG ALLERGIES:  No Known Allergies  VITALS:  Blood pressure 163/84, pulse 86, temperature 98.3 F (36.8 C), temperature source Oral, resp. rate 18, height 5' 6.97" (1.701 m), weight 124.8 kg (275 lb 2.2 oz), SpO2 94 %.  PHYSICAL EXAMINATION:   GENERAL:  44 y.o.-year-old obese patient lying in the bed with no acute distress.  EYES: Pupils equal, round, reactive to light and accommodation. No scleral icterus. Extraocular muscles intact.  HEENT: Head atraumatic, normocephalic. Oropharynx and nasopharynx clear.  NECK:  Supple, no jugular venous distention. No thyroid enlargement, no tenderness.  LUNGS: Normal breath sounds bilaterally, no wheezing, rales,rhonchi or crepitation. No use of accessory muscles of respiration.  CARDIOVASCULAR: S1, S2 normal. No murmurs, rubs, or gallops.  ABDOMEN: Soft, nontender, nondistended. Bowel sounds present. No organomegaly or mass.  EXTREMITIES: 2+ pedal edema with chronic skin changes, cyanosis, or clubbing.   NEUROLOGIC: Cranial nerves II through XII are intact. Muscle strength 5/5 in all extremities. Sensation intact. Gait not checked.  PSYCHIATRIC: The patient is alert and oriented x 3.  SKIN: No obvious rash, lesion, or ulcer.    LABORATORY PANEL:   CBC  Recent Labs Lab 01/03/15 0028  WBC 7.6  HGB 8.8*  HCT 26.8*  PLT 299   ------------------------------------------------------------------------------------------------------------------  Chemistries   Recent Labs Lab 01/03/15 0027  NA 138  K 4.4  CL 100*  CO2 28  GLUCOSE 113*  BUN 66*  CREATININE 12.10*  CALCIUM 8.0*   ------------------------------------------------------------------------------------------------------------------  Cardiac Enzymes No results for input(s): TROPONINI in the last 168 hours. ------------------------------------------------------------------------------------------------------------------  RADIOLOGY:  No results found.  EKG:  No orders found for this or any previous visit.  ASSESSMENT AND PLAN:   44 y.o. 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   1. Acute renal failure: [name]. Multiple contrast exposure this admission. Baseline creatinine of 0.99. 12/20/2014 - Receiving fourth dialysis trt today, BUN/cr some improvement today. Improving urine output as well. - cont prn Dialysis. Appreciate nephrology consult.  - Renally dose all medications. Avoid nephrotoxic agents.  Since ARF- hoping he will come off of dialysis prior to discharge  2. Acute thrombosis I82.412: off heparin gtt. Appreciate vascular input. S/p IVC filter. on apixaban - renally dosed.  3. Hypertension: well controlled. On metoprolol.   4. Hypercholesterolemia: not currently on a statin   5. Diabetes mellitus type II:  E11.29 on metformin as outpatient. Currently  on insulin. Glucose well controlled.   6. GERD- protonix BID    All the records are reviewed and case discussed with Care Management/Social Workerr. Management plans discussed with the patient, family and they are in agreement.  CODE STATUS: FULL CODE  TOTAL TIME TAKING CARE OF THIS PATIENT: 36 minutes.   POSSIBLE D/C IN 5 DAYS once off of Dialysis, DEPENDING ON CLINICAL CONDITION.   Enid BaasKALISETTI,Olena Willy M.D on 01/03/2015 at 9:36 AM  Between 7am to 6pm - Pager - [phone]  After 6pm go to [email or web address] - password [email or web address] [facility]  [facility]  Office  [phone]  CC: Primary care physician; No primary care provider on file.

## 2015-01-03 NOTE — Progress Notes (Signed)
[facility]  ROUNDING NOTE   Subjective:   Seen on hemodialysis. Fourth treatment today. Tolerating treatment well. UF  Uf yesterday of 1 litre.  Patient states he subjectively feels better.   Objective:  Vital signs in last 24 hours:  Temp:  [98.3 F (36.8 C)-99.9 F (37.7 C)] 98.3 F (36.8 C) (05/06 0900) Pulse Rate:  [79-97] 79 (05/06 1030) Resp:  [17-26] 22 (05/06 1030) BP: (136-163)/(77-93) 162/92 mmHg (05/06 1030) SpO2:  [92 %-95 %] 94 % (05/06 0900) Weight:  [121.5 kg (267 lb 13.7 oz)-124.8 kg (275 lb 2.2 oz)] 124.8 kg (275 lb 2.2 oz) (05/06 0900)  Weight change: -6.6 kg (-14 lb 8.8 oz) Filed Weights   01/01/15 1043 01/02/15 1230 01/03/15 0900  Weight: 128.1 kg (282 lb 6.6 oz) 121.5 kg (267 lb 13.7 oz) 124.8 kg (275 lb 2.2 oz)    Intake/Output: I/O last 3 completed shifts: In: -  Out: 1000 [Other:1000]   Intake/Output this shift:     General: not in acute distress Head: [place]/AT, PERRL, moist oral mucosal membranes Neck: supple, trachea midline CVS: regular rate and rhythm Resp: clear  ABD: soft, nontender, +obese EXT: 2+ edema Neuro: nonfocal Access: LIJ vascath Dr. Gilda CreaseSchnier 5/3   Basic Metabolic Panel:  Recent Labs Lab 12/31/14 1900 12/31/14 2216 01/01/15 0517 01/02/15 0930 01/03/15 0027  NA 132* 134* 134* 136 138  K 4.5 4.1 4.0 4.4 4.4  CL 100* 100* 99* 99* 100*  CO2 19* 19* 20* 24 28  GLUCOSE 102* 121* 125* 108* 113*  BUN 98* 87* 95* 93* 66*  CREATININE 13.05* 12.57* 13.77* 14.30* 12.10*  CALCIUM 7.7* 7.9* 7.7* 7.9* 8.0*  PHOS 7.3* 6.6*  --  7.3* 6.2*    Liver Function Tests:  Recent Labs Lab 12/31/14 1900 12/31/14 2216 01/02/15 0930 01/03/15 0027  ALBUMIN 2.8* 3.0* 2.9* 2.9*   No results for input(s): LIPASE, AMYLASE in the last 168 hours. No results for input(s): AMMONIA in the last 168 hours.  CBC:  Recent Labs Lab 12/28/14 0444  12/31/14 1807 12/31/14 2216 01/01/15 0517 01/02/15 0930 01/03/15 0028  WBC  11.3*  < > 7.0 9.0 8.3 7.7 7.6  NEUTROABS 7.7*  --   --   --   --   --   --   HGB 12.1*  < > 9.5* 9.7* 9.3* 8.7* 8.8*  HCT 34.9*  < > 28.3* 29.4* 27.6* 26.5* 26.8*  MCV 90  < > 88.9 89.8 90.3 90.4 90.0  PLT 224  < > 335 312 317 279 299  < > = values in this interval not displayed.  Cardiac Enzymes: No results for input(s): CKTOTAL, CKMB, CKMBINDEX, TROPONINI in the last 168 hours.  BNP: Invalid input(s): POCBNP  CBG:  Recent Labs Lab 01/01/15 2212 01/02/15 0729 01/02/15 1634 01/02/15 2157 01/03/15 0754  GLUCAP 120* 123* 125* 117* 118*    Microbiology: Results for orders placed or performed during the hospital encounter of 12/28/14  Culture, blood (single)     Status: None (Preliminary result)   Collection Time: 12/27/14  8:34 PM  Result Value Ref Range Status   Micro Text Report   Preliminary       COMMENT                   NO GROWTH IN 36 HOURS   ANTIBIOTIC  Culture, blood (single)     Status: None (Preliminary result)   Collection Time: 12/27/14  8:40 PM  Result Value Ref Range Status   Micro Text Report   Preliminary       COMMENT                   NO GROWTH IN 36 HOURS   ANTIBIOTIC                                                        Coagulation Studies: No results for input(s): LABPROT, INR in the last 72 hours.  Urinalysis: No results for input(s): COLORURINE, LABSPEC, PHURINE, GLUCOSEU, HGBUR, BILIRUBINUR, KETONESUR, PROTEINUR, UROBILINOGEN, NITRITE, LEUKOCYTESUR in the last 72 hours.  Invalid input(s): APPERANCEUR    Imaging: No results found.   Medications:     . apixaban  2.5 mg Oral BID  . insulin aspart  0-10 Units Subcutaneous TID AC & HS  . LORazepam  1 mg Oral QHS  . metoprolol tartrate  25 mg Oral BID  . ondansetron (ZOFRAN) IV  4 mg Intravenous 6 times per day  . pantoprazole  40 mg Oral BID  . polyethylene glycol  17 g Oral Daily   sodium chloride, sodium chloride,  acetaminophen, alum & mag hydroxide-simeth, docusate sodium, metoCLOPramide (REGLAN) injection, morphine injection, ondansetron (ZOFRAN) IV, simethicone, sodium chloride  Assessment/ Plan:  44 y.o. 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   1. Acute renal failure: [name]. Multiple contrast exposure this admission. Baseline creatinine of 0.99. 12/20/2014 - Creatinine and BUN continues to increaseFirst hemodialysis on 5/3 Now getting fourth dialysis treatment.   - will monitor volume status, urine output and renal function closely. Currently not recording urine output.  - Renally dose all medications. Avoid nephrotoxic agents.  - Plan to have dialysis today and then watch renal function and urine output over the weekend. Reassess for dialysis daily.   2. Hypertension: well controlled. On metoprolol.   3. Hypercholesterolemia: not currently on a statin  4. Acute thrombosis I82.412: off heparin gtt. Appreciate vascular input. Started on apixaban - renally dosed.   5. Diabetes mellitus type II: E11.29 on metformin as outpatient. Currently on insulin. Glucose well controlled.    LOS: 6 Tresa Jolley 5/6/201610:54 AM

## 2015-01-03 NOTE — Progress Notes (Signed)
Physical Therapy Treatment Patient Details Name: Thea SilversmithCharles A Elms MRN: [id number] DOB: 05-Oct-1970 Today's Date: 01/03/2015    History of Present Illness 44 yo Male came to ED with acute DVT, edema and shortness of breath. Patient was started on Heparin drip  and on 4/28 underwent mechanical thrombectomy R/L venous system and IVC filter placement. Pateint has a PMH significant for right MCL tear. He also reports dizziness with positional change.    PT Comments    Extremely lethargic post hemodialysis. Limited range with exercises. Falls asleep end of session.   Follow Up Recommendations  Home health PT     Equipment Recommendations       Recommendations for Other Services       Precautions / Restrictions Precautions Precautions: None Restrictions Weight Bearing Restrictions: No    Mobility  Bed Mobility                  Transfers                    Ambulation/Gait                 Stairs            Wheelchair Mobility    Modified Rankin (Stroke Patients Only)       Balance                                    Cognition Arousal/Alertness: Lethargic Behavior During Therapy: WFL for tasks assessed/performed Overall Cognitive Status: Within Functional Limits for tasks assessed                      Exercises General Exercises - Lower Extremity Ankle Circles/Pumps: AROM;Both;20 reps;Supine Quad Sets: Strengthening;Both;20 reps;Supine Gluteal Sets: Strengthening;Both;20 reps;Supine Short Arc Quad: AROM;Both;20 reps Heel Slides: AAROM;Both;20 reps Hip ABduction/ADduction: AAROM;Both;Supine;20 reps    General Comments        Pertinent Vitals/Pain Pain Location: Abdomen Pain Descriptors / Indicators: Pressure;Constant Pain Intervention(s): Limited activity within patient's tolerance    Home Living                      Prior Function            PT Goals (current goals can now be found in the care  plan section) Progress towards PT goals: Progressing toward goals    Frequency  Min 2X/week    PT Plan Current plan remains appropriate    Co-evaluation             End of Session   Activity Tolerance: Patient limited by fatigue Patient left: in bed;with call bell/phone within reach;with bed alarm set;with nursing/sitter in room     Time: [id number] PT Time Calculation (min) (ACUTE ONLY): 14 min  Charges:  $Therapeutic Exercise: 8-22 mins                    G Codes:      Kristeen MissHeidi Elizabeth Bishop 01/03/2015, 2:35 PM

## 2015-01-03 NOTE — Progress Notes (Signed)
This note also relates to the following rows which could not be included: Resp - Cannot attach notes to unvalidated device data   Post hd tx 

## 2015-01-04 LAB — BASIC METABOLIC PANEL
Anion gap: 10 (ref 5–15)
BUN: 57 mg/dL — ABNORMAL HIGH (ref 6–20)
CHLORIDE: 99 mmol/L — AB (ref 101–111)
CO2: 29 mmol/L (ref 22–32)
Calcium: 8 mg/dL — ABNORMAL LOW (ref 8.9–10.3)
Creatinine, Ser: 11.24 mg/dL — ABNORMAL HIGH (ref 0.61–1.24)
GFR calc non Af Amer: 5 mL/min — ABNORMAL LOW (ref >60)
GFR, EST AFRICAN AMERICAN: 6 mL/min — AB (ref >60)
Glucose, Bld: 107 mg/dL — ABNORMAL HIGH (ref 65–99)
POTASSIUM: 4.4 mmol/L (ref 3.5–5.1)
SODIUM: 138 mmol/L (ref 135–145)

## 2015-01-04 LAB — GLUCOSE, CAPILLARY
Glucose-Capillary: 111 mg/dL — ABNORMAL HIGH (ref 70–99)
Glucose-Capillary: 110 mg/dL — ABNORMAL HIGH (ref 70–99)
Glucose-Capillary: 112 mg/dL — ABNORMAL HIGH (ref 70–99)
Glucose-Capillary: 102 mg/dL — ABNORMAL HIGH (ref 70–99)

## 2015-01-04 NOTE — Progress Notes (Signed)
[facility]  ROUNDING NOTE   Subjective:   Four treatments of hemodialysis last week. Still minimal urine output. Patient's edema has improved. Fell asleep when working with PT  Objective:  Vital signs in last 24 hours:  Temp:  [98 F (36.7 C)-99.5 F (37.5 C)] 99.3 F (37.4 C) (05/07 0556) Pulse Rate:  [74-96] 92 (05/07 0556) Resp:  [14-22] 18 (05/06 1216) BP: (148-176)/(80-93) 150/80 mmHg (05/07 0556) SpO2:  [92 %-97 %] 92 % (05/07 0556)  Weight change: 3.3 kg (7 lb 4.4 oz) Filed Weights   01/01/15 1043 01/02/15 1230 01/03/15 0900  Weight: 128.1 kg (282 lb 6.6 oz) 121.5 kg (267 lb 13.7 oz) 124.8 kg (275 lb 2.2 oz)    Intake/Output: I/O last 3 completed shifts: In: -  Out: 1650 [Urine:150; Other:1500]   Intake/Output this shift:     General: not in acute distress Head: [place]/AT, PERRL, moist oral mucosal membranes Neck: supple, trachea midline CVS: regular rate and rhythm Resp: clear  ABD: soft, nontender, +obese EXT: 2+ edema Neuro: nonfocal Access: LIJ vascath Dr. Gilda CreaseSchnier 5/3   Basic Metabolic Panel:  Recent Labs Lab 12/31/14 1900 12/31/14 2216 01/01/15 0517 01/02/15 0930 01/03/15 0027 01/04/15 0541  NA 132* 134* 134* 136 138 138  K 4.5 4.1 4.0 4.4 4.4 4.4  CL 100* 100* 99* 99* 100* 99*  CO2 19* 19* 20* 24 28 29   GLUCOSE 102* 121* 125* 108* 113* 107*  BUN 98* 87* 95* 93* 66* 57*  CREATININE 13.05* 12.57* 13.77* 14.30* 12.10* 11.24*  CALCIUM 7.7* 7.9* 7.7* 7.9* 8.0* 8.0*  PHOS 7.3* 6.6*  --  7.3* 6.2*  --     Liver Function Tests:  Recent Labs Lab 12/31/14 1900 12/31/14 2216 01/02/15 0930 01/03/15 0027  ALBUMIN 2.8* 3.0* 2.9* 2.9*   No results for input(s): LIPASE, AMYLASE in the last 168 hours. No results for input(s): AMMONIA in the last 168 hours.  CBC:  Recent Labs Lab 12/31/14 1807 12/31/14 2216 01/01/15 0517 01/02/15 0930 01/03/15 0028  WBC 7.0 9.0 8.3 7.7 7.6  HGB 9.5* 9.7* 9.3* 8.7* 8.8*  HCT 28.3* 29.4*  27.6* 26.5* 26.8*  MCV 88.9 89.8 90.3 90.4 90.0  PLT 335 312 317 279 299    Cardiac Enzymes: No results for input(s): CKTOTAL, CKMB, CKMBINDEX, TROPONINI in the last 168 hours.  BNP: Invalid input(s): POCBNP  CBG:  Recent Labs Lab 01/03/15 0754 01/03/15 1239 01/03/15 1622 01/03/15 2303 01/04/15 0717  GLUCAP 118* 109* 109* 120* 111*    Microbiology: Results for orders placed or performed during the hospital encounter of 12/28/14  Culture, blood (single)     Status: None (Preliminary result)   Collection Time: 12/27/14  8:34 PM  Result Value Ref Range Status   Micro Text Report   Preliminary       COMMENT                   NO GROWTH IN 36 HOURS   ANTIBIOTIC                                                      Culture, blood (single)     Status: None (Preliminary result)   Collection Time: 12/27/14  8:40 PM  Result Value Ref Range Status   Micro Text Report   Preliminary  COMMENT                   NO GROWTH IN 36 HOURS   ANTIBIOTIC                                                        Coagulation Studies: No results for input(s): LABPROT, INR in the last 72 hours.  Urinalysis: No results for input(s): COLORURINE, LABSPEC, PHURINE, GLUCOSEU, HGBUR, BILIRUBINUR, KETONESUR, PROTEINUR, UROBILINOGEN, NITRITE, LEUKOCYTESUR in the last 72 hours.  Invalid input(s): APPERANCEUR    Imaging: No results found.   Medications:     . apixaban  2.5 mg Oral BID  . feeding supplement (NEPRO CARB STEADY)  237 mL Oral BID BM  . insulin aspart  0-10 Units Subcutaneous TID AC & HS  . LORazepam  1 mg Oral QHS  . metoprolol tartrate  25 mg Oral BID  . ondansetron (ZOFRAN) IV  4 mg Intravenous 6 times per day  . pantoprazole  40 mg Oral BID  . polyethylene glycol  17 g Oral Daily   sodium chloride, sodium chloride, acetaminophen, alum & mag hydroxide-simeth, docusate sodium, metoCLOPramide (REGLAN) injection, morphine injection, ondansetron (ZOFRAN) IV, simethicone,  sodium chloride  Assessment/ Plan:  44 y.o. 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   1. Acute renal failure: [name]. Multiple contrast exposure this admission. Baseline creatinine of 0.99. 12/20/2014 - Creatinine and BUN continues to increase. First hemodialysis on 5/3 Four hemodialysis treatments yesterday.    - will monitor volume status, urine output and renal function closely.  - Renally dose all medications. Avoid nephrotoxic agents.  - Plan to watch renal function and urine output over the weekend. Reassess for dialysis daily.   2. Hypertension: well controlled. On metoprolol.   3. Hypercholesterolemia: not currently on a statin  4. Acute thrombosis I82.412: off heparin gtt. Appreciate vascular input. Started on apixaban - renally dosed.   5. Diabetes mellitus type II: E11.29 on metformin as outpatient. Currently on insulin. Glucose well controlled.    LOS: 7 Cindy Fullman 5/7/20169:46 AM

## 2015-01-04 NOTE — Plan of Care (Signed)
Problem: Discharge Progression Outcomes Goal: Barriers To Progression Addressed/Resolved Outcome: Progressing Individualization   Individualization of Care:   Patient prefers to be called Leonette Mostharles. 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 History of HTN and metabolic syndrome controlled by home meds. Moderate fall risk - no bed alarm activated, hourly rounding performed. Goal: Other Discharge Outcomes/Goals Outcome: Progressing Plan of care progress to goal: pt remains on dyalsis next treatment scheduled for Monday, no complaints of pain , remains free from falls, continue to monitor labs.

## 2015-01-04 NOTE — Plan of Care (Signed)
Problem: Discharge Progression Outcomes Goal: Other Discharge Outcomes/Goals Outcome: Progressing Plan of Care Progress to Goal: Patient up to chair. No complaints of pain. Scheduled zofran given for nausea.

## 2015-01-04 NOTE — Progress Notes (Signed)
[facility] [place] [facility]   PATIENT NAME: Ryan GaussCharles Erazo    MR#:  [id number]  DATE OF BIRTH:  10-16-1970  SUBJECTIVE:  CHIEF COMPLAINT:  No chief complaint on file.   Not much urine out put, swelling presisits  REVIEW OF SYSTEMS:   CONSTITUTIONAL: Obese patient, No fever, fatigue or weakness.  EYES: No blurred or double vision.  EARS, NOSE, AND THROAT: No tinnitus or ear pain.  RESPIRATORY: No cough, shortness of breath, wheezing or hemoptysis.  CARDIOVASCULAR: No chest pain, orthopnea, edema.  GASTROINTESTINAL: No nausea, vomiting, diarrhea or abdominal pain.  GENITOURINARY: No dysuria, hematuria.  ENDOCRINE: No polyuria, nocturia,  HEMATOLOGY: No anemia, easy bruising or bleeding SKIN: No rash or lesion. MUSCULOSKELETAL: No joint pain or arthritis.   NEUROLOGIC: No tingling, numbness, weakness.  PSYCHIATRY: No anxiety or depression.   DRUG ALLERGIES:  No Known Allergies  VITALS:  Blood pressure 150/80, pulse 92, temperature 99.3 F (37.4 C), temperature source Oral, resp. rate 18, height 5' 6.97" (1.701 m), weight 124.8 kg (275 lb 2.2 oz), SpO2 92 %.  PHYSICAL EXAMINATION:   GENERAL:  44 y.o.-year-old obese patient lying in the bed with no acute distress.  EYES: Pupils equal, round, reactive to light and accommodation. No scleral icterus. Extraocular muscles intact.  HEENT: Head atraumatic, normocephalic. Oropharynx and nasopharynx clear.  NECK:  Supple, no jugular venous distention. No thyroid enlargement, no tenderness.  LUNGS: Normal breath sounds bilaterally, no wheezing, rales,rhonchi or crepitation. No use of accessory muscles of respiration.  CARDIOVASCULAR: S1, S2 normal. No murmurs, rubs, or gallops.  ABDOMEN: Soft, nontender, nondistended. Bowel sounds present. No organomegaly or mass.  EXTREMITIES: 2+ pedal edema with chronic skin changes, cyanosis, or clubbing.  NEUROLOGIC: Cranial nerves II through XII are intact. Muscle  strength 5/5 in all extremities. Sensation intact. Gait not checked.  PSYCHIATRIC: The patient is alert and oriented x 3.  SKIN: No obvious rash, lesion, or ulcer.    LABORATORY PANEL:   CBC  Recent Labs Lab 01/03/15 0028  WBC 7.6  HGB 8.8*  HCT 26.8*  PLT 299   ------------------------------------------------------------------------------------------------------------------  Chemistries   Recent Labs Lab 01/04/15 0541  NA 138  K 4.4  CL 99*  CO2 29  GLUCOSE 107*  BUN 57*  CREATININE 11.24*  CALCIUM 8.0*   ------------------------------------------------------------------------------------------------------------------  Cardiac Enzymes No results for input(s): TROPONINI in the last 168 hours. ------------------------------------------------------------------------------------------------------------------  RADIOLOGY:  No results found.  EKG:  No orders found for this or any previous visit.  ASSESSMENT AND PLAN:   44 y.o. 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   1. Acute renal failure: [name]. Multiple contrast exposure this admission. Baseline creatinine of 0.99. 12/20/2014 - Reced fourth dialysisyest  BUN/cr similar - cont prn Dialysis. Appreciate nephrology consult.  - Renally dose all medications. Avoid nephrotoxic agents.  Since ARF- hoping he will come off of dialysis prior to discharge  2. Acute thrombosis I82.412: off heparin gtt. Appreciate vascular input. S/p IVC filter. on apixaban - renally dosed.  3. Hypertension: well controlled. On metoprolol.   4. Hypercholesterolemia: not currently on a statin   5. Diabetes mellitus type II: E11.29 on metformin as outpatient. Currently on insulin. Glucose well controlled.   6. GERD- protonix BID    All the  records are reviewed and case  discussed with Care Management/Social Workerr. Management plans discussed with the patient, family and they are in agreement.  CODE STATUS: FULL CODE  TOTAL TIME TAKING CARE OF THIS PATIENT: 35 minutes.   POSSIBLE D/C IN 5 DAYS once off of Dialysis, DEPENDING ON CLINICAL CONDITION.   Auburn BilberryPATEL, Larwence Tu M.D on 01/04/2015 at 2:16 PM  Between 7am to 6pm - Pager - [phone]  After 6pm go to [email or web address] - password [email or web address]  [facility]  Office  [phone]  CC: Primary care physician; No primary care provider on file.

## 2015-01-05 LAB — RENAL FUNCTION PANEL
ANION GAP: 11 (ref 5–15)
Albumin: 2.9 g/dL — ABNORMAL LOW (ref 3.5–5.0)
BUN: 72 mg/dL — ABNORMAL HIGH (ref 6–20)
CHLORIDE: 98 mmol/L — AB (ref 101–111)
CO2: 28 mmol/L (ref 22–32)
Calcium: 8.1 mg/dL — ABNORMAL LOW (ref 8.9–10.3)
Creatinine, Ser: 13.57 mg/dL — ABNORMAL HIGH (ref 0.61–1.24)
GFR calc non Af Amer: 4 mL/min — ABNORMAL LOW (ref >60)
GFR, EST AFRICAN AMERICAN: 4 mL/min — AB (ref >60)
GLUCOSE: 105 mg/dL — AB (ref 65–99)
POTASSIUM: 4.6 mmol/L (ref 3.5–5.1)
Phosphorus: 7.8 mg/dL — ABNORMAL HIGH (ref 2.5–4.6)
Sodium: 137 mmol/L (ref 135–145)

## 2015-01-05 LAB — CBC
HCT: 26.3 % — ABNORMAL LOW (ref 40.0–52.0)
Hemoglobin: 8.7 g/dL — ABNORMAL LOW (ref 13.0–18.0)
MCH: 29.9 pg (ref 26.0–34.0)
MCHC: 33.1 g/dL (ref 32.0–36.0)
MCV: 90.3 fL (ref 80.0–100.0)
PLATELETS: 381 10*3/uL (ref 150–440)
RBC: 2.91 MIL/uL — AB (ref 4.40–5.90)
RDW: 13.7 % (ref 11.5–14.5)
WBC: 7 10*3/uL (ref 3.8–10.6)

## 2015-01-05 LAB — GLUCOSE, CAPILLARY
Glucose-Capillary: 103 mg/dL — ABNORMAL HIGH (ref 70–99)
Glucose-Capillary: 111 mg/dL — ABNORMAL HIGH (ref 70–99)
Glucose-Capillary: 112 mg/dL — ABNORMAL HIGH (ref 70–99)
Glucose-Capillary: 98 mg/dL (ref 70–99)

## 2015-01-05 MED ORDER — SODIUM CHLORIDE 0.9 % IV SOLN: 100.0000 mL | INTRAVENOUS | Status: DC | PRN

## 2015-01-05 MED ORDER — EPOETIN ALFA 10000 UNIT/ML IJ SOLN
10000.0000 [IU] | Freq: Once | INTRAMUSCULAR | Status: AC
  Administered 2015-01-06: 10000 [IU] via INTRAVENOUS

## 2015-01-05 NOTE — Plan of Care (Signed)
Problem: Discharge Progression Outcomes Goal: Other Discharge Outcomes/Goals Outcome: Not Progressing Pt continues to have nausea; on scheduled IV Zofran; poor PO intake/appetite; 25ml urine output this shift; pt scheduled for HD on 01/06/15

## 2015-01-05 NOTE — Plan of Care (Signed)
Problem: Discharge Progression Outcomes Goal: Other Discharge Outcomes/Goals Outcome: Progressing Plan of care progress to goal: pt nauseated x 1 this shift did not require any PRNs, planning for dialysis on Monday then further discharge planning.

## 2015-01-05 NOTE — Progress Notes (Signed)
[facility]  ROUNDING NOTE   Subjective:   No significant urine output. Continues to have significant peripheral edema. Creatinine elevated.   Objective:  Vital signs in last 24 hours:  Temp:  [98.7 F (37.1 C)-98.8 F (37.1 C)] 98.8 F (37.1 C) (05/08 0537) Pulse Rate:  [80-93] 88 (05/08 0537) Resp:  [16-20] 16 (05/08 0537) BP: (135-147)/(70-76) 135/76 mmHg (05/08 0537) SpO2:  [91 %-93 %] 93 % (05/08 0537)  Weight change:  Filed Weights   01/01/15 1043 01/02/15 1230 01/03/15 0900  Weight: 128.1 kg (282 lb 6.6 oz) 121.5 kg (267 lb 13.7 oz) 124.8 kg (275 lb 2.2 oz)    Intake/Output: I/O last 3 completed shifts: In: 240 [P.O.:240] Out: 150 [Urine:150]   Intake/Output this shift:  Total I/O In: 120 [P.O.:120] Out: 55 [Urine:25; Emesis/NG output:30]  General: not in acute distress Head: [place]/AT, PERRL, moist oral mucosal membranes Neck: supple, trachea midline CVS: regular rate and rhythm Resp: clear  ABD: soft, nontender, +obese EXT: 2+ edema Neuro: nonfocal Access: LIJ vascath Dr. Gilda CreaseSchnier 5/3   Basic Metabolic Panel:  Recent Labs Lab 12/31/14 1900 12/31/14 2216 01/01/15 0517 01/02/15 0930 01/03/15 0027 01/04/15 0541 01/05/15 0544  NA 132* 134* 134* 136 138 138 137  K 4.5 4.1 4.0 4.4 4.4 4.4 4.6  CL 100* 100* 99* 99* 100* 99* 98*  CO2 19* 19* 20* 24 28 29 28   GLUCOSE 102* 121* 125* 108* 113* 107* 105*  BUN 98* 87* 95* 93* 66* 57* 72*  CREATININE 13.05* 12.57* 13.77* 14.30* 12.10* 11.24* 13.57*  CALCIUM 7.7* 7.9* 7.7* 7.9* 8.0* 8.0* 8.1*  PHOS 7.3* 6.6*  --  7.3* 6.2*  --  7.8*    Liver Function Tests:  Recent Labs Lab 12/31/14 1900 12/31/14 2216 01/02/15 0930 01/03/15 0027 01/05/15 0544  ALBUMIN 2.8* 3.0* 2.9* 2.9* 2.9*   No results for input(s): LIPASE, AMYLASE in the last 168 hours. No results for input(s): AMMONIA in the last 168 hours.  CBC:  Recent Labs Lab 12/31/14 2216 01/01/15 0517 01/02/15 0930 01/03/15 0028  01/05/15 0543  WBC 9.0 8.3 7.7 7.6 7.0  HGB 9.7* 9.3* 8.7* 8.8* 8.7*  HCT 29.4* 27.6* 26.5* 26.8* 26.3*  MCV 89.8 90.3 90.4 90.0 90.3  PLT 312 317 279 299 381    Cardiac Enzymes: No results for input(s): CKTOTAL, CKMB, CKMBINDEX, TROPONINI in the last 168 hours.  BNP: Invalid input(s): POCBNP  CBG:  Recent Labs Lab 01/04/15 1113 01/04/15 1633 01/04/15 2216 01/05/15 0718 01/05/15 1136  GLUCAP 110* 112* 102* 103* 111*    Microbiology: Results for orders placed or performed during the hospital encounter of 12/28/14  Culture, blood (single)     Status: None (Preliminary result)   Collection Time: 12/27/14  8:34 PM  Result Value Ref Range Status   Micro Text Report   Preliminary       COMMENT                   NO GROWTH IN 36 HOURS   ANTIBIOTIC                                                      Culture, blood (single)     Status: None (Preliminary result)   Collection Time: 12/27/14  8:40 PM  Result Value Ref Range Status  Micro Text Report   Preliminary       COMMENT                   NO GROWTH IN 36 HOURS   ANTIBIOTIC                                                        Coagulation Studies: No results for input(s): LABPROT, INR in the last 72 hours.  Urinalysis: No results for input(s): COLORURINE, LABSPEC, PHURINE, GLUCOSEU, HGBUR, BILIRUBINUR, KETONESUR, PROTEINUR, UROBILINOGEN, NITRITE, LEUKOCYTESUR in the last 72 hours.  Invalid input(s): APPERANCEUR    Imaging: No results found.   Medications:     . apixaban  2.5 mg Oral BID  . feeding supplement (NEPRO CARB STEADY)  237 mL Oral BID BM  . insulin aspart  0-10 Units Subcutaneous TID AC & HS  . LORazepam  1 mg Oral QHS  . metoprolol tartrate  25 mg Oral BID  . ondansetron (ZOFRAN) IV  4 mg Intravenous 6 times per day  . pantoprazole  40 mg Oral BID  . polyethylene glycol  17 g Oral Daily   acetaminophen, alum & mag hydroxide-simeth, docusate sodium, metoCLOPramide (REGLAN) injection,  morphine injection, ondansetron (ZOFRAN) IV, simethicone, sodium chloride  Assessment/ Plan:  44 y.o. 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   1. Acute renal failure: [name]. Multiple contrast exposure this admission. Baseline creatinine of 0.99. 12/20/2014 - Creatinine and BUN continues to increase. First hemodialysis on 5/3 Four hemodialysis treatments. Holding dialysis over the weekend and monitor for recovery of function.     - will monitor volume status, urine output and renal function closely.  - Renally dose all medications. Avoid nephrotoxic agents.  - Plan to watch renal function and urine output over the weekend. Reassess for dialysis daily.  - At this time, most likely will need dialysis tomorrow.   2. Hypertension: well controlled. On metoprolol.   3. Hypercholesterolemia: not currently on a statin  4. Acute thrombosis I82.412: off heparin gtt. Appreciate vascular input. Started on apixaban - renally dosed.   5. Diabetes mellitus type II: E11.29 on metformin as outpatient. Currently on insulin. Glucose well controlled.    LOS: 8 Ryan [name] 5/8/20162:03 PM

## 2015-01-05 NOTE — Progress Notes (Signed)
  S: Sleeping, notes his legs are still quite swollen and having difficulty standing and walking.Today he does note his legs do feel better however he feels somewhat overwhelmed by his degree of lethargy. He denies shortness of breath.   O: AF VSS  Left IJ temp cath intact  Rt IJ access site uninfected  Bilateral lower extremities with 2+ edema and severe venous changes  A: Acute renal failure  DVT s/p thrombolysis  P: Continue dialysis will use right IJ if permacath is needed  He is again encouraged to get up out of bed, once again upon entering the room I find that he is still in bed with his eyes closed sleeping and it is approximately 1 PM in the afternoon. His BUN and creatinine do not appear to be recovering and he remains oliguric. In discussion with nephrology he will likely need a tunneled catheter at some point this week. I will follow for proper timing. He continues on anticoagulation for his DVT.

## 2015-01-05 NOTE — Progress Notes (Signed)
[facility] [place] at [facility]   PATIENT NAME: Ryan GaussCharles [name]    MR#:  [id number]  DATE OF BIRTH:  18-Apr-1971  SUBJECTIVE:  CHIEF COMPLAINT:  No chief complaint on file.  Patient's creatinine level is worse today and not smoking making much urine. Complains of swelling of the lower extremities.  REVIEW OF SYSTEMS:   CONSTITUTIONAL: Obese patient, No fever, fatigue or weakness.  EYES: No blurred or double vision.  EARS, NOSE, AND THROAT: No tinnitus or ear pain.  RESPIRATORY: No cough, shortness of breath, wheezing or hemoptysis.  CARDIOVASCULAR: No chest pain, orthopnea, edema.  GASTROINTESTINAL: No nausea, vomiting, diarrhea or abdominal pain.  GENITOURINARY: No dysuria, hematuria.  ENDOCRINE: No polyuria, nocturia,  HEMATOLOGY: No anemia, easy bruising or bleeding SKIN: No rash or lesion. MUSCULOSKELETAL: No joint pain or arthritis.   NEUROLOGIC: No tingling, numbness, weakness.  PSYCHIATRY: No anxiety or depression.   DRUG ALLERGIES:  No Known Allergies  VITALS:  Blood pressure 135/76, pulse 88, temperature 98.8 F (37.1 C), temperature source Oral, resp. rate 16, height 5' 6.97" (1.701 m), weight 124.8 kg (275 lb 2.2 oz), SpO2 93 %.  PHYSICAL EXAMINATION:   GENERAL:  44 y.o.-year-old obese patient lying in the bed with no acute distress.  EYES: Pupils equal, round, reactive to light and accommodation. No scleral icterus. Extraocular muscles intact.  HEENT: Head atraumatic, normocephalic. Oropharynx and nasopharynx clear.  NECK:  Supple, no jugular venous distention. No thyroid enlargement, no tenderness.  LUNGS: Normal breath sounds bilaterally, no wheezing, rales,rhonchi or crepitation. No use of accessory muscles of respiration.  CARDIOVASCULAR: S1, S2 normal. No murmurs, rubs, or gallops.  ABDOMEN: Soft, nontender, nondistended. Bowel sounds present. No organomegaly or mass.  EXTREMITIES: 2+ pedal edema with chronic skin changes, cyanosis,  or clubbing.  NEUROLOGIC: Cranial nerves II through XII are intact. Muscle strength 5/5 in all extremities. Sensation intact. Gait not checked.  PSYCHIATRIC: The patient is alert and oriented x 3.  SKIN: No obvious rash, lesion, or ulcer.    LABORATORY PANEL:   CBC  Recent Labs Lab 01/05/15 0543  WBC 7.0  HGB 8.7*  HCT 26.3*  PLT 381   ------------------------------------------------------------------------------------------------------------------  Chemistries   Recent Labs Lab 01/05/15 0544  NA 137  K 4.6  CL 98*  CO2 28  GLUCOSE 105*  BUN 72*  CREATININE 13.57*  CALCIUM 8.1*   ------------------------------------------------------------------------------------------------------------------  Cardiac Enzymes No results for input(s): TROPONINI in the last 168 hours. ------------------------------------------------------------------------------------------------------------------  RADIOLOGY:  No results found.  EKG:  No orders found for this or any previous visit.  ASSESSMENT AND PLAN:   44 y.o. 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   1. Acute renal failure: [name]. Multiple contrast exposure this admission. Baseline creatinine of 0.99. 12/20/2014 -  Dialyzes 4 times during this hospitalization, will likely need dialysis again. - cont prn Dialysis. Appreciate nephrology consult.  - Renally dose all medications. Avoid nephrotoxic agents.   2. Acute thrombosis I82.412: off heparin gtt. Appreciate vascular input. S/p IVC filter. on apixaban - renally dosed.  3. Hypertension: well controlled. On metoprolol.   4. Hypercholesterolemia: not currently on a statin   5. Diabetes mellitus type II: on metformin as outpatient. Currently on insulin. Glucose well controlled.    6. GERD- protonix BID    All the records  are reviewed and case discussed with Care Management/Social Workerr. Management plans discussed with the patient, family and they are in agreement.  CODE STATUS: FULL CODE  TOTAL TIME TAKING CARE OF THIS PATIENT: 32 minutes.   POSSIBLE D/C IN 5 DAYS once off of Dialysis, DEPENDING ON CLINICAL CONDITION.   Auburn BilberryPATEL, [name] M.D on 01/05/2015 at 11:42 AM  Between 7am to 6pm - Pager - [phone]  After 6pm go to [email or web address] - password [email or web address] [facility]  [facility]  Office  [phone]  CC: Primary care physician; No primary care provider on file.

## 2015-01-06 LAB — BASIC METABOLIC PANEL
ANION GAP: 14 (ref 5–15)
BUN: 93 mg/dL — ABNORMAL HIGH (ref 6–20)
CHLORIDE: 97 mmol/L — AB (ref 101–111)
CO2: 26 mmol/L (ref 22–32)
CREATININE: 16.9 mg/dL — AB (ref 0.61–1.24)
Calcium: 8.1 mg/dL — ABNORMAL LOW (ref 8.9–10.3)
GFR calc Af Amer: 3 mL/min — ABNORMAL LOW (ref >60)
GFR calc non Af Amer: 3 mL/min — ABNORMAL LOW (ref >60)
Glucose, Bld: 121 mg/dL — ABNORMAL HIGH (ref 65–99)
Potassium: 4.5 mmol/L (ref 3.5–5.1)
Sodium: 137 mmol/L (ref 135–145)

## 2015-01-06 LAB — CBC
HCT: 27.2 % — ABNORMAL LOW (ref 40.0–52.0)
HEMOGLOBIN: 9 g/dL — AB (ref 13.0–18.0)
MCH: 29.8 pg (ref 26.0–34.0)
MCHC: 33.1 g/dL (ref 32.0–36.0)
MCV: 89.8 fL (ref 80.0–100.0)
Platelets: 432 10*3/uL (ref 150–440)
RBC: 3.02 MIL/uL — ABNORMAL LOW (ref 4.40–5.90)
RDW: 13.9 % (ref 11.5–14.5)
WBC: 6.9 10*3/uL (ref 3.8–10.6)

## 2015-01-06 LAB — GLUCOSE, CAPILLARY
Glucose-Capillary: 100 mg/dL — ABNORMAL HIGH (ref 70–99)
Glucose-Capillary: 97 mg/dL (ref 70–99)
Glucose-Capillary: 119 mg/dL — ABNORMAL HIGH (ref 70–99)
Glucose-Capillary: 113 mg/dL — ABNORMAL HIGH (ref 70–99)

## 2015-01-06 MED ORDER — ALPRAZOLAM 0.25 MG PO TABS
0.2500 mg | ORAL_TABLET | Freq: Three times a day (TID) | ORAL | Status: DC | PRN
  Administered 2015-01-06 – 2015-01-11 (×2): 0.25 mg via ORAL
  Filled 2015-01-06 (×2): qty 1

## 2015-01-06 MED ORDER — HEPARIN SODIUM (PORCINE) 1000 UNIT/ML DIALYSIS
1000.0000 [IU] | Freq: Once | INTRAMUSCULAR | Status: AC
  Administered 2015-01-06: 14:00:00 3200 [IU] via INTRAVENOUS

## 2015-01-06 NOTE — Progress Notes (Signed)
PT Cancellation Note  Patient Details Name: Ryan SilversmithCharles A [name] MRN: [id number] DOB: Jul 03, 1971   Cancelled Treatment:    Reason Eval/Treat Not Completed: Patient at procedure or test/unavailable (Pt off unit at dialysis.)  Will re-attempt treatment at a later time/date as able.   Irving Burtonmily Blayn Whetsell 01/06/2015, 11:45 AM Hendricks LimesEmily Kimmie Berggren, PT [phone]

## 2015-01-06 NOTE — Progress Notes (Signed)
Subjective:   No significant urine output. Continues to have significant peripheral edema. Creatinine elevated.  Patient seen during dialysis  Objective:  Vital signs in last 24 hours:  Temp:  [98.2 F (36.8 C)-99.7 F (37.6 C)] 98.2 F (36.8 C) (05/09 0948) Pulse Rate:  [63-84] 71 (05/09 1100) Resp:  [16-18] 16 (05/09 0948) BP: (136-175)/(75-89) 175/81 mmHg (05/09 1018) SpO2:  [93 %-95 %] 95 % (05/09 0510)  Weight change:  Filed Weights   01/01/15 1043 01/02/15 1230 01/03/15 0900  Weight: 128.1 kg (282 lb 6.6 oz) 121.5 kg (267 lb 13.7 oz) 124.8 kg (275 lb 2.2 oz)    Intake/Output: I/O last 3 completed shifts: In: 240 [P.O.:240] Out: 55 [Urine:25; Emesis/NG output:30]   Intake/Output this shift:  Total I/O In: 120 [P.O.:120] Out: -   General: not in acute distress Head: [place]/AT, PERRL, moist oral mucosal membranes Neck: supple, trachea midline CVS: regular rate and rhythm Resp: clear  ABD: soft, nontender, +obese EXT: 2+ edema Neuro: nonfocal Access: LIJ vascath Dr. Gilda CreaseSchnier 5/3   Basic Metabolic Panel:  Recent Labs Lab 12/31/14 1900 12/31/14 2216 01/01/15 0517 01/02/15 0930 01/03/15 0027 01/04/15 0541 01/05/15 0544  NA 132* 134* 134* 136 138 138 137  K 4.5 4.1 4.0 4.4 4.4 4.4 4.6  CL 100* 100* 99* 99* 100* 99* 98*  CO2 19* 19* 20* 24 28 29 28   GLUCOSE 102* 121* 125* 108* 113* 107* 105*  BUN 98* 87* 95* 93* 66* 57* 72*  CREATININE 13.05* 12.57* 13.77* 14.30* 12.10* 11.24* 13.57*  CALCIUM 7.7* 7.9* 7.7* 7.9* 8.0* 8.0* 8.1*  PHOS 7.3* 6.6*  --  7.3* 6.2*  --  7.8*    Liver Function Tests:  Recent Labs Lab 12/31/14 1900 12/31/14 2216 01/02/15 0930 01/03/15 0027 01/05/15 0544  ALBUMIN 2.8* 3.0* 2.9* 2.9* 2.9*   No results for input(s): LIPASE, AMYLASE in the last 168 hours. No results for input(s): AMMONIA in the last 168 hours.  CBC:  Recent Labs Lab 01/01/15 0517 01/02/15 0930 01/03/15 0028 01/05/15 0543 01/06/15 1008  WBC 8.3 7.7  7.6 7.0 6.9  HGB 9.3* 8.7* 8.8* 8.7* 9.0*  HCT 27.6* 26.5* 26.8* 26.3* 27.2*  MCV 90.3 90.4 90.0 90.3 89.8  PLT 317 279 299 381 432    Cardiac Enzymes: No results for input(s): CKTOTAL, CKMB, CKMBINDEX, TROPONINI in the last 168 hours.  BNP: Invalid input(s): POCBNP  CBG:  Recent Labs Lab 01/05/15 0718 01/05/15 1136 01/05/15 1643 01/05/15 2207 01/06/15 0736  GLUCAP 103* 111* 112* 98 100*    Microbiology: Results for orders placed or performed during the hospital encounter of 12/28/14  Culture, blood (single)     Status: None (Preliminary result)   Collection Time: 12/27/14  8:34 PM  Result Value Ref Range Status   Micro Text Report   Preliminary       COMMENT                   NO GROWTH IN 36 HOURS   ANTIBIOTIC                                                      Culture, blood (single)     Status: None (Preliminary result)   Collection Time: 12/27/14  8:40 PM  Result Value Ref Range Status   Micro Text  Report   Preliminary       COMMENT                   NO GROWTH IN 36 HOURS   ANTIBIOTIC                                                        Coagulation Studies: No results for input(s): LABPROT, INR in the last 72 hours.  Urinalysis: No results for input(s): COLORURINE, LABSPEC, PHURINE, GLUCOSEU, HGBUR, BILIRUBINUR, KETONESUR, PROTEINUR, UROBILINOGEN, NITRITE, LEUKOCYTESUR in the last 72 hours.  Invalid input(s): APPERANCEUR    Imaging: No results found.   Medications:     . apixaban  2.5 mg Oral BID  . epoetin (EPOGEN/PROCRIT) injection  10,000 Units Intravenous Once  . feeding supplement (NEPRO CARB STEADY)  237 mL Oral BID BM  . insulin aspart  0-10 Units Subcutaneous TID AC & HS  . LORazepam  1 mg Oral QHS  . metoprolol tartrate  25 mg Oral BID  . ondansetron (ZOFRAN) IV  4 mg Intravenous 6 times per day  . pantoprazole  40 mg Oral BID  . polyethylene glycol  17 g Oral Daily   sodium chloride, sodium chloride, acetaminophen, alum & mag  hydroxide-simeth, docusate sodium, metoCLOPramide (REGLAN) injection, morphine injection, ondansetron (ZOFRAN) IV, simethicone, sodium chloride  Assessment/ Plan:  44 y.o. 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   1. Acute renal failure: Ryan [name]. Multiple contrast exposure this admission. Baseline creatinine of 0.99. 12/20/2014 - Creatinine and BUN continues to increase. First hemodialysis on 5/3 - 5 hemodialysis treatments since admission.  - dialysis was held over the weekend; No recovery of renal function.     - will monitor volume status, urine output and renal function closely.  - Renally dose all medications. Avoid nephrotoxic agents.  - Seems like will need dialysis for several days - weeks in the near future   2. Peripheral edema - remove fluid with HD as tolerated   3. Hypercholesterolemia: not currently on a statin  4. Acute thrombosis I82.412: off heparin gtt. started on apixaban   5. Diabetes mellitus type II: E11.29 on metformin as outpatient. Currently on insulin. Glucose well controlled.    LOS: 9 Ryan [name] 5/9/201611:27 AM

## 2015-01-06 NOTE — Progress Notes (Signed)
Hd treatment end 45 minutes early as per Dr Thedore MinsSingh. Venous chamber clotting. I was able to return blood.  1000ml total fluid removed. Catheter lumens packed with heparin.

## 2015-01-06 NOTE — Progress Notes (Signed)
[facility] [place] [facility]   PATIENT NAME: Ryan [name]    MR#:  [id number]  DATE OF BIRTH:  Oct 02, 1970  SUBJECTIVE:  CHIEF COMPLAINT:  No chief complaint on file.   Seen in HD , doesn't complain of any cp or sob  REVIEW OF SYSTEMS:   CONSTITUTIONAL: Obese patient, No fever, fatigue or weakness.  EYES: No blurred or double vision.  EARS, NOSE, AND THROAT: No tinnitus or ear pain.  RESPIRATORY: No cough, shortness of breath, wheezing or hemoptysis.  CARDIOVASCULAR: No chest pain, orthopnea, edema.  GASTROINTESTINAL: No nausea, vomiting, diarrhea or abdominal pain.  GENITOURINARY: No dysuria, hematuria.  ENDOCRINE: No polyuria, nocturia,  HEMATOLOGY: No anemia, easy bruising or bleeding SKIN: No rash or lesion. MUSCULOSKELETAL: No joint pain or arthritis.   NEUROLOGIC: No tingling, numbness, weakness.  PSYCHIATRY: No anxiety or depression.   DRUG ALLERGIES:  No Known Allergies  VITALS:  Blood pressure 175/81, pulse 72, temperature 98.2 F (36.8 C), temperature source Oral, resp. rate 16, height 5' 6.97" (1.701 m), weight 124.8 kg (275 lb 2.2 oz), SpO2 95 %.  PHYSICAL EXAMINATION:   GENERAL:  44 y.o.-year-old obese patient lying in the bed with no acute distress.  EYES: Pupils equal, round, reactive to light and accommodation. No scleral icterus. Extraocular muscles intact.  HEENT: Head atraumatic, normocephalic. Oropharynx and nasopharynx clear.  NECK:  Supple, no jugular venous distention. No thyroid enlargement, no tenderness.  LUNGS: Normal breath sounds bilaterally, no wheezing, rales,rhonchi or crepitation. No use of accessory muscles of respiration.  CARDIOVASCULAR: S1, S2 normal. No murmurs, rubs, or gallops.  ABDOMEN: Soft, nontender, nondistended. Bowel sounds present. No organomegaly or mass.  EXTREMITIES: 2+ pedal edema with chronic skin changes, cyanosis, or clubbing.  NEUROLOGIC: Cranial nerves II through XII are intact. Muscle  strength 5/5 in all extremities. Sensation intact. Gait not checked.  PSYCHIATRIC: The patient is alert and oriented x 3.  SKIN: No obvious rash, lesion, or ulcer.    LABORATORY PANEL:   CBC  Recent Labs Lab 01/06/15 1008  WBC 6.9  HGB 9.0*  HCT 27.2*  PLT 432   ------------------------------------------------------------------------------------------------------------------  Chemistries   Recent Labs Lab 01/06/15 1008  NA 137  K 4.5  CL 97*  CO2 26  GLUCOSE 121*  BUN 93*  CREATININE 16.90*  CALCIUM 8.1*   ------------------------------------------------------------------------------------------------------------------  Cardiac Enzymes No results for input(s): TROPONINI in the last 168 hours. ------------------------------------------------------------------------------------------------------------------  RADIOLOGY:  No results found.  EKG:  No orders found for this or any previous visit.  ASSESSMENT AND PLAN:   44 y.o. 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   1. Acute renal failure: [name]. Multiple contrast exposure this admission. Baseline creatinine of 0.99. 12/20/2014 -  Dialyzes 5 times during this hospitalization, will likely need dialysis again. - cont prn Dialysis. Appreciate nephrology consult.  - Renally dose all medications. Avoid nephrotoxic agents.   2. Acute thrombosis I82.412: off heparin gtt. Appreciate vascular input. S/p IVC filter. on apixaban - renally dosed.  3. Hypertension: well controlled. On metoprolol.   4. Hypercholesterolemia: not currently on a statin   5. Diabetes mellitus type II: on metformin as outpatient. Currently on insulin. Glucose well controlled.   6. GERD- protonix BID    All the records are reviewed and case  discussed with Care  Management/Social Workerr. Management plans discussed with the patient, family and they are in agreement.  CODE STATUS: FULL CODE  TOTAL TIME TAKING CARE OF THIS PATIENT:7725min minutes.   POSSIBLE D/C IN 5 DAYS once off of Dialysis, DEPENDING ON CLINICAL CONDITION.   Auburn BilberryPATEL, Truett Mcfarlan M.D on 01/06/2015 at 11:46 AM  Between 7am to 6pm - Pager - [phone]  After 6pm go to [email or web address] - password [email or web address] [facility]  [facility]  Office  [phone]  CC: Primary care physician; No primary care provider on file.

## 2015-01-06 NOTE — Plan of Care (Signed)
Problem: Discharge Progression Outcomes Goal: Other Discharge Outcomes/Goals Outcome: Progressing Pt had dialysis today. Removed per dialysis RN. No c/o pain. Receiving zofran IV scheduled around meal times. Poor PO intake.

## 2015-01-06 NOTE — Plan of Care (Signed)
Problem: Discharge Progression Outcomes Goal: Discharge plan in place and appropriate Individualization  Outcome: Progressing Patient prefers to be called Leonette Mostharles. 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 History of HTN and metabolic syndrome controlled by home meds. Moderate fall risk - no bed alarm activated, hourly rounding performed Goal: Other Discharge Outcomes/Goals Outcome: Progressing Plan of Care Progress to Goal:   Pt with c/o headache during the night. PRN tylenol given with relief noted. Pt with scheduled Zofran for nausea. Relief noted. HD scheduled for 01-06-15

## 2015-01-07 LAB — RENAL FUNCTION PANEL
ALBUMIN: 3.1 g/dL — AB (ref 3.5–5.0)
ANION GAP: 12 (ref 5–15)
Albumin: 2.8 g/dL — ABNORMAL LOW (ref 3.5–5.0)
Anion gap: 14 (ref 5–15)
BUN: 73 mg/dL — ABNORMAL HIGH (ref 6–20)
BUN: 75 mg/dL — AB (ref 6–20)
CALCIUM: 8.2 mg/dL — AB (ref 8.9–10.3)
CHLORIDE: 97 mmol/L — AB (ref 101–111)
CO2: 27 mmol/L (ref 22–32)
CO2: 27 mmol/L (ref 22–32)
CREATININE: 14.85 mg/dL — AB (ref 0.61–1.24)
CREATININE: 15.42 mg/dL — AB (ref 0.61–1.24)
Calcium: 7.7 mg/dL — ABNORMAL LOW (ref 8.9–10.3)
Chloride: 97 mmol/L — ABNORMAL LOW (ref 101–111)
GFR calc Af Amer: 4 mL/min — ABNORMAL LOW (ref >60)
GFR calc Af Amer: 4 mL/min — ABNORMAL LOW (ref >60)
GFR calc non Af Amer: 3 mL/min — ABNORMAL LOW (ref >60)
GFR, EST NON AFRICAN AMERICAN: 3 mL/min — AB (ref >60)
Glucose, Bld: 105 mg/dL — ABNORMAL HIGH (ref 65–99)
Glucose, Bld: 122 mg/dL — ABNORMAL HIGH (ref 65–99)
Phosphorus: 7.8 mg/dL — ABNORMAL HIGH (ref 2.5–4.6)
Phosphorus: 7.9 mg/dL — ABNORMAL HIGH (ref 2.5–4.6)
Potassium: 4.6 mmol/L (ref 3.5–5.1)
Potassium: 4.4 mmol/L (ref 3.5–5.1)
Sodium: 138 mmol/L (ref 135–145)
Sodium: 136 mmol/L (ref 135–145)

## 2015-01-07 LAB — GLUCOSE, CAPILLARY
Glucose-Capillary: 106 mg/dL — ABNORMAL HIGH (ref 70–99)
Glucose-Capillary: 136 mg/dL — ABNORMAL HIGH (ref 70–99)
Glucose-Capillary: 108 mg/dL — ABNORMAL HIGH (ref 70–99)
Glucose-Capillary: 111 mg/dL — ABNORMAL HIGH (ref 70–99)

## 2015-01-07 MED ORDER — TUBERCULIN PPD 5 UNIT/0.1ML ID SOLN: 5.0000 [IU] | Freq: Once | INTRADERMAL | Status: DC

## 2015-01-07 MED ORDER — TUBERCULIN PPD 5 UNIT/0.1ML ID SOLN
5.0000 [IU] | Freq: Once | INTRADERMAL | Status: AC
  Administered 2015-01-07: 5 [IU] via INTRADERMAL
  Filled 2015-01-07: qty 0.1

## 2015-01-07 NOTE — Consult Note (Signed)
HEMATOLOGY note -   Hypercoagulable workup including factor V Leiden, factor mutation, anticardiolipin antibody, beta-2 glycoprotein 1 panel, Protein C and S panel are unremarkable. Serum PSA is normal range at 0.8.  CT scan of the abdomen and pelvis did not report any pancreatic abnormality or other obvious malignancy, but he had bilateral inguinal lymphadenopathy. Given this along with unintentional weight loss and recurrent thromboembolic phenomena, PET scan was done on 12/25/14 which reported hypermetabolic lymphadenopathy only in inguinal and iliac areas and this could be more likely inflammatory than malignant given b/l remarkable LE thrombophlebitis.  From inguinal lymphadenopathy standpoint, recommend monitoring with repeat CT scan in 3-4 months and consider further workup if there is persistent/progressive adenopathy. Otherwise agreed with ongoing treatment plan. We will sign off the case. Please reconsult hematology if needed in the future.

## 2015-01-07 NOTE — Plan of Care (Signed)
Problem: Discharge Progression Outcomes Goal: Other Discharge Outcomes/Goals Outcome: Progressing Plan of Care Progress to Goal: Barriers to Progression Addressed/Resolved-Patient recieving dialysis. BUN and Creatinine remain elevated. Discharge plan in place and appropriate- Hemodynamically stable-VSS. Up to chair. Complications resolved/controlled-VSS Activity appropriate for discharge plan-Patient up to chair, tolerated well.

## 2015-01-07 NOTE — Plan of Care (Signed)
Problem: Discharge Progression Outcomes Goal: Other Discharge Outcomes/Goals Outcome: Progressing - Tylenol given for headache with improvement. - Continues on scheduled zofran for nausea. Will continue to monitor.  -

## 2015-01-07 NOTE — Progress Notes (Signed)
S: Sleeping when I enter the room.  He notes his legs are still quite swollen but they are better and having difficulty standing and walking.With respect to his uremic symptoms of weakness and fatigue he does not feel any improvement.   O: AF VSS  Left IJ temp cath intact  Rt IJ access site uninfected  Bilateral lower extremities with 2+ edema and severe venous changes  A: Acute renal failure  DVT s/p thrombolysis  P: Continue dialysis will use right IJ for a permacath. Timing of the placement with respect to his anticoagulation will be reviewed feel given his filter in place that he could be off for 3 doses and subsequently the catheter could be placed.   Once again he is again encouraged to get up out of bed,   He continues on anticoagulation for his DVT.

## 2015-01-07 NOTE — Progress Notes (Signed)
Subjective:   No significant urine output. Continues to have significant peripheral edema. Creatinine elevated.   Objective:  Vital signs in last 24 hours:  Temp:  [98.4 F (36.9 C)-100.2 F (37.9 C)] 98.5 F (36.9 C) (05/10 1408) Pulse Rate:  [75-81] 80 (05/10 1323) Resp:  [18] 18 (05/10 1408) BP: (145-153)/(77-87) 153/80 mmHg (05/10 1408) SpO2:  [92 %-95 %] 94 % (05/10 1408)  Weight change:  Filed Weights   01/03/15 0900 01/06/15 0930 01/06/15 1230  Weight: 124.8 kg (275 lb 2.2 oz) 124.8 kg (275 lb 2.2 oz) 130 kg (286 lb 9.6 oz)    Intake/Output: I/O last 3 completed shifts: In: 120 [P.O.:120] Out: 2000 [Other:2000]   Intake/Output this shift:  Total I/O In: 360 [P.O.:360] Out: -   General: not in acute distress Head: [place]/AT,  moist oral mucosal membranes Neck: supple, trachea midline CVS: regular rate and rhythm Resp: clear  ABD: soft, nontender, +obese EXT: 2+ edema Neuro: nonfocal Access: LIJ vascath Dr. Gilda CreaseSchnier 5/3   Basic Metabolic Panel:  Recent Labs Lab 01/02/15 0930 01/03/15 0027 01/04/15 0541 01/05/15 0544 01/06/15 1008 01/07/15 0744 01/07/15 1315  NA 136 138 138 137 137 138 136  K 4.4 4.4 4.4 4.6 4.5 4.6 4.4  CL 99* 100* 99* 98* 97* 97* 97*  CO2 24 28 29 28 26 27 27   GLUCOSE 108* 113* 107* 105* 121* 105* 122*  BUN 93* 66* 57* 72* 93* 73* 75*  CREATININE 14.30* 12.10* 11.24* 13.57* 16.90* 14.85* 15.42*  CALCIUM 7.9* 8.0* 8.0* 8.1* 8.1* 8.2* 7.7*  PHOS 7.3* 6.2*  --  7.8*  --  7.8* 7.9*    Liver Function Tests:  Recent Labs Lab 01/02/15 0930 01/03/15 0027 01/05/15 0544 01/07/15 0744 01/07/15 1315  ALBUMIN 2.9* 2.9* 2.9* 3.1* 2.8*   No results for input(s): LIPASE, AMYLASE in the last 168 hours. No results for input(s): AMMONIA in the last 168 hours.  CBC:  Recent Labs Lab 01/01/15 0517 01/02/15 0930 01/03/15 0028 01/05/15 0543 01/06/15 1008  WBC 8.3 7.7 7.6 7.0 6.9  HGB 9.3* 8.7* 8.8* 8.7* 9.0*  HCT 27.6* 26.5*  26.8* 26.3* 27.2*  MCV 90.3 90.4 90.0 90.3 89.8  PLT 317 279 299 381 432    Cardiac Enzymes: No results for input(s): CKTOTAL, CKMB, CKMBINDEX, TROPONINI in the last 168 hours.  BNP: Invalid input(s): POCBNP  CBG:  Recent Labs Lab 01/06/15 1621 01/06/15 2145 01/07/15 0723 01/07/15 1117 01/07/15 1617  GLUCAP 119* 113* 106* 136* 108*    Microbiology: Results for orders placed or performed during the hospital encounter of 12/28/14  Culture, blood (single)     Status: None (Preliminary result)   Collection Time: 12/27/14  8:34 PM  Result Value Ref Range Status   Micro Text Report   Preliminary       COMMENT                   NO GROWTH IN 36 HOURS   ANTIBIOTIC                                                      Culture, blood (single)     Status: None (Preliminary result)   Collection Time: 12/27/14  8:40 PM  Result Value Ref Range Status   Micro Text Report   Preliminary  COMMENT                   NO GROWTH IN 36 HOURS   ANTIBIOTIC                                                        Coagulation Studies: No results for input(s): LABPROT, INR in the last 72 hours.  Urinalysis: No results for input(s): COLORURINE, LABSPEC, PHURINE, GLUCOSEU, HGBUR, BILIRUBINUR, KETONESUR, PROTEINUR, UROBILINOGEN, NITRITE, LEUKOCYTESUR in the last 72 hours.  Invalid input(s): APPERANCEUR    Imaging: No results found.   Medications:     . apixaban  2.5 mg Oral BID  . feeding supplement (NEPRO CARB STEADY)  237 mL Oral BID BM  . insulin aspart  0-10 Units Subcutaneous TID AC & HS  . LORazepam  1 mg Oral QHS  . metoprolol tartrate  25 mg Oral BID  . ondansetron (ZOFRAN) IV  4 mg Intravenous 6 times per day  . pantoprazole  40 mg Oral BID  . polyethylene glycol  17 g Oral Daily  . tuberculin  5 Units Intradermal Once   sodium chloride, sodium chloride, acetaminophen, ALPRAZolam, alum & mag hydroxide-simeth, docusate sodium, metoCLOPramide (REGLAN) injection,  morphine injection, ondansetron (ZOFRAN) IV, simethicone, sodium chloride  Assessment/ Plan:  44 y.o. 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 for with right lower extremity DVT.   1. 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 Multiple contrast exposure this admission. Baseline creatinine of 0.99. 12/20/2014 - Creatinine and BUN remain critically elevated. First hemodialysis on 5/3 - 5 hemodialysis treatments since admission.  - dialysis was held over the weekend; No recovery of renal function.     - will monitor volume status, urine output and renal function closely.  -  dose all medications for Cr Cl < 10. Avoid nephrotoxic agents.  -  Start outpatient d/c planning- d/w patient pathway team - Perncath and PD cathter placement tomorrow.   2. Peripheral edema - remove fluid with HD as tolerated   3. Hypercholesterolemia: not currently on a statin  4. Acute thrombosis I82.412: off heparin gtt. started on apixaban   5. Diabetes mellitus type II: E11.29 on metformin as outpatient. Currently on insulin. Glucose well controlled.    LOS: 10 Cheryel Kyte 5/10/20164:42 PM

## 2015-01-07 NOTE — Progress Notes (Signed)
[facility] [place] at [facility]   PATIENT NAME: Ryan GaussCharles [name]    MR#:  [id number]  DATE OF BIRTH:  [birth year]  SUBJECTIVE:  CHIEF COMPLAINT:  No chief complaint on file.   Was anxious yesterday started on xanax as needed feels better   REVIEW OF SYSTEMS:   CONSTITUTIONAL: Obese patient, No fever, fatigue or weakness.  EYES: No blurred or double vision.  EARS, NOSE, AND THROAT: No tinnitus or ear pain.  RESPIRATORY: No cough, shortness of breath, wheezing or hemoptysis.  CARDIOVASCULAR: No chest pain, orthopnea, edema.  GASTROINTESTINAL: No nausea, vomiting, diarrhea or abdominal pain.  GENITOURINARY: No dysuria, hematuria.  ENDOCRINE: No polyuria, nocturia,  HEMATOLOGY: No anemia, easy bruising or bleeding SKIN: No rash or lesion. MUSCULOSKELETAL: No joint pain or arthritis.   NEUROLOGIC: No tingling, numbness, weakness.  PSYCHIATRY: No anxiety or depression.   DRUG ALLERGIES:  No Known Allergies  VITALS:  Blood pressure 153/87, pulse 75, temperature 98.4 F (36.9 C), temperature source Oral, resp. rate 18, height 5' 6.97" (1.701 m), weight 130 kg (286 lb 9.6 oz), SpO2 95 %.  PHYSICAL EXAMINATION:   GENERAL:  44 y.o.-year-old obese patient lying in the bed with no acute distress.  EYES: Pupils equal, round, reactive to light and accommodation. No scleral icterus. Extraocular muscles intact.  HEENT: Head atraumatic, normocephalic. Oropharynx and nasopharynx clear.  NECK:  Supple, no jugular venous distention. No thyroid enlargement, no tenderness.  LUNGS: Normal breath sounds bilaterally, no wheezing, rales,rhonchi or crepitation. No use of accessory muscles of respiration.  CARDIOVASCULAR: S1, S2 normal. No murmurs, rubs, or gallops.  ABDOMEN: Soft, nontender, nondistended. Bowel sounds present. No organomegaly or mass.  EXTREMITIES: 2+ pedal edema with chronic skin changes, cyanosis, or clubbing.  NEUROLOGIC: Cranial nerves II through XII are  intact. Muscle strength 5/5 in all extremities. Sensation intact. Gait not checked.  PSYCHIATRIC: The patient is alert and oriented x 3.  SKIN: No obvious rash, lesion, or ulcer.    LABORATORY PANEL:   CBC  Recent Labs Lab 01/06/15 1008  WBC 6.9  HGB 9.0*  HCT 27.2*  PLT 432   ------------------------------------------------------------------------------------------------------------------  Chemistries   Recent Labs Lab 01/07/15 0744  NA 138  K 4.6  CL 97*  CO2 27  GLUCOSE 105*  BUN 73*  CREATININE 14.85*  CALCIUM 8.2*   ------------------------------------------------------------------------------------------------------------------  Cardiac Enzymes No results for input(s): TROPONINI in the last 168 hours. ------------------------------------------------------------------------------------------------------------------  RADIOLOGY:  No results found.  EKG:  No orders found for this or any previous visit.  ASSESSMENT AND PLAN:   44 y.o. 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   1. Acute renal failure: [name]. Multiple contrast exposure this admission. Baseline creatinine of 0.99. 12/20/2014 -  Dialyzes  mulitple times during this hospitalization, will likely need dialysis again. - cont prn Dialysis. Appreciate nephrology consult.  - Renally dose all medications. Avoid nephrotoxic agents.   2. Acute thrombosis I82.412: off heparin gtt. Appreciate vascular input. S/p IVC filter. on apixaban - renally dosed.  3. Hypertension: well controlled. On metoprolol.   4. Hypercholesterolemia: not currently on a statin   5. Diabetes mellitus type II: on metformin as outpatient. Currently on insulin. Glucose well controlled.   6. GERD- protonix BID  7. Axiety d/o on xanax bid  prn    All  the records are reviewed and case discussed with Care Management/Social Workerr. Management plans discussed with the patient, family and they are in agreement.  CODE STATUS: FULL CODE  TOTAL TIME TAKING CARE OF THIS PATIENT:6822min minutes.   POSSIBLE D/C IN 5 DAYS once off of Dialysis, DEPENDING ON CLINICAL CONDITION.   Auburn BilberryPATEL, Ceana Fiala M.D on 01/07/2015 at 1:04 PM  Between 7am to 6pm - Pager - [phone]  After 6pm go to [email or web address] - password [email or web address] [facility]  [facility]  Office  [phone]  CC: Primary care physician; No primary care provider on file.

## 2015-01-07 NOTE — Progress Notes (Signed)
Nutrition Follow-up  INTERVENTION:  Meals and snacks: Cater to pt preferences Medical Nutritional Supplement: Recommend continuing nepro BID for added nutrition  NUTRITION DIAGNOSIS:  Inadequate oral intake related to acute illness as evidenced by per patient/family report, other (see comment).  GOAL:  Patient will meet greater than or equal to 90% of their needs  MONITOR:  Energy intake Electrolyte and renal Profile Anthropometrics  REASON FOR ASSESSMENT: Follow-up  ASSESSMENT:  Pt working with PT this afternoon, s/p BM. Per MD note, pt HD held over the weekend, without renal function recovery; to continue HD for next several days.   PO Intake: pt ate 100% of lunch today on visit, sipping Nepro. Recorded po intake 100% of breakfast this am; 30-40% of meals yesterday.  Medications: Novolog, Zofran, Protonix, Miralax given yesterday per RN Lillia AbedLindsay  Height:  Ht Readings from Last 1 Encounters:  12/27/14 5' 6.97" (1.701 m)    Weight:  Wt Readings from Last 1 Encounters:  01/06/15 286 lb 9.6 oz (130 kg)    Ideal Body Weight:  67 kg  Wt Readings from Last 10 Encounters:  01/06/15 286 lb 9.6 oz (130 kg)    BMI:  Body mass index is 44.93 kg/(m^2).  Estimated Nutritional Needs:  Kcal:  1816-2179 kcal/day (BEE: 1513 x 1.2 AF x 1.0-1.2 IF)- using IBW  Protein:  80-94 g Pro/day (1.2-1.4 g/ kg IBW/ day)- using IBW  Fluid:  1675-2010 ml/ day (25-30 ml/ kg IBW)- using IBW  Skin:  Reviewed, no issues  Diet Order:  Diet renal with fluid restriction Fluid restriction:: 1200 mL Fluid; Room service appropriate?: Yes; Fluid consistency:: Thin  EDUCATION NEEDS:  No education needs identified at this time   Intake/Output Summary (Last 24 hours) at 01/07/15 1421 Last data filed at 01/07/15 0948  Gross per 24 hour  Intake    360 ml  Output      0 ml  Net    360 ml    Last BM:  5/10  MODERATE Care Level  Leda QuailAllyson Linkon Siverson, RD, LDN Pager [phone]

## 2015-01-07 NOTE — Plan of Care (Signed)
Problem: Discharge Progression Outcomes Goal: Discharge plan in place and appropriate Individualization  Patient prefers to be called Ryan [name]. 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 History of HTN and metabolic syndrome controlled by home meds. Moderate fall risk - no bed alarm activated, hourly rounding performed Goal: Other Discharge Outcomes/Goals Tylenol given for headache with improvement. - Continues on scheduled zofran for nausea. Will continue to monitor.

## 2015-01-07 NOTE — Progress Notes (Signed)
Physical Therapy Treatment Patient Details Name: Ryan SilversmithCharles A [name] MRN: [id number] DOB: 1971-08-25 Today's Date: 01/07/2015    History of Present Illness 44 yo Male came to ED with acute DVT, edema and shortness of breath. Patient was started on Heparin drip  and on 4/28 underwent mechanical thrombectomy R/L venous system and IVC filter placement. Pateint has a PMH significant for right MCL tear. He also reports dizziness with positional change.    PT Comments    Pt with improved tolerance for PT today. Pt up in chair and educated on importance/benefits of being upright in chair for overall strength and improvement of orthostasis.   Follow Up Recommendations  Home health PT     Equipment Recommendations       Recommendations for Other Services       Precautions / Restrictions Precautions Precautions: None Restrictions Weight Bearing Restrictions: No    Mobility  Bed Mobility Overal bed mobility: Modified Independent Bed Mobility: Supine to Sit     Supine to sit: Modified independent (Device/Increase time)        Transfers Overall transfer level: Modified independent Equipment used: Rolling walker (2 wheeled) Transfers: Sit to/from Stand Sit to Stand: Modified independent (Device/Increase time)            Ambulation/Gait Ambulation/Gait assistance: Min guard Ambulation Distance (Feet): 5 Feet Assistive device: Rolling walker (2 wheeled);1 person hand held assist Gait Pattern/deviations: Step-to pattern;Decreased step length - right;Decreased step length - left;Decreased stride length (forward flexed)   Gait velocity interpretation: Below normal speed for age/gender General Gait Details: Bed to chair   Stairs            Wheelchair Mobility    Modified Rankin (Stroke Patients Only)       Balance                                    Cognition Arousal/Alertness: Awake/alert Behavior During Therapy: WFL for tasks  assessed/performed Overall Cognitive Status: Within Functional Limits for tasks assessed                      Exercises General Exercises - Lower Extremity Ankle Circles/Pumps: AROM;Both;20 reps;Supine Quad Sets: Strengthening;Both;20 reps;Supine Gluteal Sets: Strengthening;Both;20 reps;Supine Long Arc Quad: AROM;Both;20 reps;Seated Heel Slides: AROM;Both;20 reps;Supine Hip ABduction/ADduction: AAROM;Both;Supine;20 reps Toe Raises: AROM;20 reps;Both;Seated Heel Raises: AROM;Both;20 reps;Seated Other Exercises Other Exercises: Abdominal brace 10x    General Comments        Pertinent Vitals/Pain Pain Assessment: No/denies pain    Home Living                      Prior Function            PT Goals (current goals can now be found in the care plan section) Progress towards PT goals: Progressing toward goals    Frequency  Min 2X/week    PT Plan Current plan remains appropriate    Co-evaluation             End of Session   Activity Tolerance: Patient tolerated treatment well (Fatigues quickly) Patient left: in chair;with call bell/phone within reach     Time: 1323-1350 PT Time Calculation (min) (ACUTE ONLY): 27 min  Charges:  $Gait Training: 8-22 mins $Therapeutic Exercise: 8-22 mins                    G Codes:  Elsie StainHeidi Elizabeth Bishop 01/07/2015, 2:15 PM

## 2015-01-08 LAB — GLUCOSE, CAPILLARY
Glucose-Capillary: 100 mg/dL — ABNORMAL HIGH (ref 70–99)
Glucose-Capillary: 119 mg/dL — ABNORMAL HIGH (ref 70–99)
Glucose-Capillary: 110 mg/dL — ABNORMAL HIGH (ref 70–99)
Glucose-Capillary: 112 mg/dL — ABNORMAL HIGH (ref 70–99)

## 2015-01-08 NOTE — Progress Notes (Signed)
[facility] [place] [facility]   PATIENT NAME: Ryan GaussCharles Coury    MR#:  [id number]  DATE OF BIRTH:  05/05/1971  SUBJECTIVE:  CHIEF COMPLAINT:  No chief complaint on file.   Pt feels ok had hd yesterday,   REVIEW OF SYSTEMS:   CONSTITUTIONAL: Obese patient, No fever,  + fatigue + weakness.  EYES: No blurred or double vision.  EARS, NOSE, AND THROAT: No tinnitus or ear pain.  RESPIRATORY: No cough, shortness of breath, wheezing or hemoptysis.  CARDIOVASCULAR: No chest pain, orthopnea, edema.  GASTROINTESTINAL: No nausea, vomiting, diarrhea or abdominal pain.  GENITOURINARY: No dysuria, hematuria.  ENDOCRINE: No polyuria, nocturia,  HEMATOLOGY: No anemia, easy bruising or bleeding SKIN: No rash or lesion. MUSCULOSKELETAL: No joint pain or arthritis.   NEUROLOGIC: No tingling, numbness, weakness.  PSYCHIATRY: No anxiety or depression.   DRUG ALLERGIES:  No Known Allergies  VITALS:  Blood pressure 147/76, pulse 77, temperature 99.3 F (37.4 C), temperature source Oral, resp. rate 18, height 5' 6.97" (1.701 m), weight 123.8 kg (272 lb 14.9 oz), SpO2 97 %.  PHYSICAL EXAMINATION:   GENERAL:  44 y.o.-year-old obese patient lying in the bed with no acute distress.  EYES: Pupils equal, round, reactive to light and accommodation. No scleral icterus. Extraocular muscles intact.  HEENT: Head atraumatic, normocephalic. Oropharynx and nasopharynx clear.  NECK:  Supple, no jugular venous distention. No thyroid enlargement, no tenderness.  LUNGS: Normal breath sounds bilaterally, no wheezing, rales,rhonchi or crepitation. No use of accessory muscles of respiration.  CARDIOVASCULAR: S1, S2 normal. No murmurs, rubs, or gallops.  ABDOMEN: Soft, nontender, nondistended. Bowel sounds present. No organomegaly or mass.  EXTREMITIES: 2+ pedal edema with chronic skin changes, cyanosis, or clubbing.  NEUROLOGIC: Cranial nerves II through XII are intact. Muscle strength 5/5 in  all extremities. Sensation intact. Gait not checked.  PSYCHIATRIC: The patient is alert and oriented x 3.  SKIN: No obvious rash, lesion, or ulcer.    LABORATORY PANEL:   CBC  Recent Labs Lab 01/06/15 1008  WBC 6.9  HGB 9.0*  HCT 27.2*  PLT 432   ------------------------------------------------------------------------------------------------------------------  Chemistries   Recent Labs Lab 01/07/15 1315  NA 136  K 4.4  CL 97*  CO2 27  GLUCOSE 122*  BUN 75*  CREATININE 15.42*  CALCIUM 7.7*   ------------------------------------------------------------------------------------------------------------------  Cardiac Enzymes No results for input(s): TROPONINI in the last 168 hours. ------------------------------------------------------------------------------------------------------------------  RADIOLOGY:  No results found.  EKG:  No orders found for this or any previous visit.  ASSESSMENT AND PLAN:   44 y.o. 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   1. Acute renal failure: [name]. Multiple contrast exposure this admission. Baseline creatinine of 0.99. 12/20/2014 -  Dialyzes  mulitple times during this hospitalization, will likely need dialysis again. - cont prn Dialysis. Appreciate nephrology consult.  - Renally dose all medications. Avoid nephrotoxic agents.   2. Acute thrombosis I82.412: off heparin gtt. Appreciate vascular input. S/p IVC filter. on apixaban - renally dosed.  3. Hypertension: well controlled. On metoprolol.   4. Hypercholesterolemia: not currently on a statin   5. Diabetes mellitus type II: on metformin as outpatient. Currently on insulin. Glucose well controlled.   6. GERD- protonix BID  7. Axiety d/o on xanax bid prn    All the records are  reviewed and case discussed with Care Management/Social Workerr. Management plans discussed with the patient, family and they are in agreement.  CODE STATUS: FULL CODE  TOTAL TIME TAKING CARE OF THIS PATIENT:2522min minutes.   POSSIBLE D/C IN 5 DAYS once off of Dialysis, DEPENDING ON CLINICAL CONDITION.   Auburn BilberryPATEL, Socorro Kanitz M.D on 01/08/2015 at 3:25 PM  Between 7am to 6pm - Pager - [phone]  After 6pm go to [email or web address] - password [email or web address] [facility]  [facility]  Office  [phone]  CC: Primary care physician; No primary care provider on file.

## 2015-01-08 NOTE — Progress Notes (Signed)
Physical Therapy Treatment Patient Details Name: Ryan SilversmithCharles A [name] MRN: [id number] DOB: 1970/11/04 Today's Date: 01/08/2015    History of Present Illness 44 yo Male came to ED with acute DVT, edema and shortness of breath. Patient was started on Heparin drip  and on 4/28 underwent mechanical thrombectomy R/L venous system and IVC filter placement. Pateint has a PMH significant for right MCL tear. He also reports dizziness with positional change.    PT Comments    Pt participated well with exercises. Showing improved range of motion/strength. Pt did not wish out of bed this p.m due to fatigue post dialysis. MD in during session; discussed level of pt's activity and mobility. Continue to progress as tolerated.   Follow Up Recommendations  Home health PT     Equipment Recommendations       Recommendations for Other Services       Precautions / Restrictions Precautions Precautions: None Restrictions Weight Bearing Restrictions: No    Mobility  Bed Mobility                  Transfers                    Ambulation/Gait                 Stairs            Wheelchair Mobility    Modified Rankin (Stroke Patients Only)       Balance                                    Cognition Arousal/Alertness: Awake/alert Behavior During Therapy: WFL for tasks assessed/performed Overall Cognitive Status: Within Functional Limits for tasks assessed                      Exercises General Exercises - Lower Extremity Ankle Circles/Pumps: AROM;Both;Supine;Other reps (comment) (30X) Quad Sets: Strengthening;Both;20 reps;Supine Gluteal Sets: Strengthening;Both;20 reps;Supine Short Arc Quad: AROM;Both;20 reps Heel Slides: AROM;Both;20 reps;Supine Hip ABduction/ADduction: AAROM;Both;Supine;20 reps Other Exercises Other Exercises: B ham sets 20x  Other Exercises: Pt educated on positioning LEs equal to or higher than head for improvment in  moving dependent fluid Other Exercises: Encouraged exercises throughout the day as tolerated    General Comments        Pertinent Vitals/Pain Pain Assessment: No/denies pain    Home Living                      Prior Function            PT Goals (current goals can now be found in the care plan section) Progress towards PT goals: Progressing toward goals (Fatigues quickly due to dialysis)    Frequency  Min 2X/week    PT Plan Current plan remains appropriate    Co-evaluation             End of Session   Activity Tolerance: Patient limited by fatigue Patient left: in bed;with call bell/phone within reach;with bed alarm set;with family/visitor present     Time: [id number] PT Time Calculation (min) (ACUTE ONLY): 24 min  Charges:  $Therapeutic Exercise: 23-37 mins                    G Codes:      Kristeen MissHeidi Elizabeth Bishop 01/08/2015, 3:37 PM

## 2015-01-08 NOTE — Plan of Care (Signed)
Problem: Discharge Progression Outcomes Goal: Other Discharge Outcomes/Goals Outcome: Progressing Renal function elevated. Pt had dialysis today. 1.3L pulled off. zofran scheduled q4h. Pt tolerating diet well. Plan for perm cath placement in near future. Vascular consulted yesterday. [facility] is moderate assist, [facility]

## 2015-01-08 NOTE — Progress Notes (Signed)
Subjective:   No significant urine output. Continues to have significant peripheral edema. Creatinine elevated.  Patient seen during dialysis Tolerating well   Objective:  Vital signs in last 24 hours:  Temp:  [98 F (36.7 C)-99.3 F (37.4 C)] 99.3 F (37.4 C) (05/11 1352) Pulse Rate:  [69-83] 77 (05/11 1352) Resp:  [18-20] 18 (05/11 1352) BP: (126-173)/(70-99) 147/76 mmHg (05/11 1352) SpO2:  [93 %-98 %] 97 % (05/11 1352) Weight:  [122.5 kg (270 lb 1 oz)-123.8 kg (272 lb 14.9 oz)] 123.8 kg (272 lb 14.9 oz) (05/11 1330)  Weight change:  Filed Weights   01/06/15 1230 01/08/15 1017 01/08/15 1330  Weight: 130 kg (286 lb 9.6 oz) 122.5 kg (270 lb 1 oz) 123.8 kg (272 lb 14.9 oz)    Intake/Output: I/O last 3 completed shifts: In: 360 [P.O.:360] Out: -    Intake/Output this shift:  Total I/O In: -  Out: 1346 [Other:1346]  General: not in acute distress Head: [place]/AT,  moist oral mucosal membranes Neck: supple, trachea midline CVS: regular rate and rhythm Resp: clear  ABD: soft, nontender, +obese EXT: 2+ edema Neuro: nonfocal Access: LIJ vascath Dr. Gilda CreaseSchnier 5/3   Basic Metabolic Panel:  Recent Labs Lab 01/02/15 0930 01/03/15 0027 01/04/15 0541 01/05/15 0544 01/06/15 1008 01/07/15 0744 01/07/15 1315  NA 136 138 138 137 137 138 136  K 4.4 4.4 4.4 4.6 4.5 4.6 4.4  CL 99* 100* 99* 98* 97* 97* 97*  CO2 24 28 29 28 26 27 27   GLUCOSE 108* 113* 107* 105* 121* 105* 122*  BUN 93* 66* 57* 72* 93* 73* 75*  CREATININE 14.30* 12.10* 11.24* 13.57* 16.90* 14.85* 15.42*  CALCIUM 7.9* 8.0* 8.0* 8.1* 8.1* 8.2* 7.7*  PHOS 7.3* 6.2*  --  7.8*  --  7.8* 7.9*    Liver Function Tests:  Recent Labs Lab 01/02/15 0930 01/03/15 0027 01/05/15 0544 01/07/15 0744 01/07/15 1315  ALBUMIN 2.9* 2.9* 2.9* 3.1* 2.8*   No results for input(s): LIPASE, AMYLASE in the last 168 hours. No results for input(s): AMMONIA in the last 168 hours.  CBC:  Recent Labs Lab 01/02/15 0930  01/03/15 0028 01/05/15 0543 01/06/15 1008  WBC 7.7 7.6 7.0 6.9  HGB 8.7* 8.8* 8.7* 9.0*  HCT 26.5* 26.8* 26.3* 27.2*  MCV 90.4 90.0 90.3 89.8  PLT 279 299 381 432    Cardiac Enzymes: No results for input(s): CKTOTAL, CKMB, CKMBINDEX, TROPONINI in the last 168 hours.  BNP: Invalid input(s): POCBNP  CBG:  Recent Labs Lab 01/07/15 1117 01/07/15 1617 01/07/15 2213 01/08/15 0712 01/08/15 1356  GLUCAP 136* 108* 111* 100* 119*    Microbiology: Results for orders placed or performed during the hospital encounter of 12/28/14  Culture, blood (single)     Status: None (Preliminary result)   Collection Time: 12/27/14  8:34 PM  Result Value Ref Range Status   Micro Text Report   Preliminary       COMMENT                   NO GROWTH IN 36 HOURS   ANTIBIOTIC                                                      Culture, blood (single)     Status: None (Preliminary result)   Collection  Time: 12/27/14  8:40 PM  Result Value Ref Range Status   Micro Text Report   Preliminary       COMMENT                   NO GROWTH IN 36 HOURS   ANTIBIOTIC                                                        Coagulation Studies: No results for input(s): LABPROT, INR in the last 72 hours.  Urinalysis: No results for input(s): COLORURINE, LABSPEC, PHURINE, GLUCOSEU, HGBUR, BILIRUBINUR, KETONESUR, PROTEINUR, UROBILINOGEN, NITRITE, LEUKOCYTESUR in the last 72 hours.  Invalid input(s): APPERANCEUR    Imaging: No results found.   Medications:     . apixaban  2.5 mg Oral BID  . feeding supplement (NEPRO CARB STEADY)  237 mL Oral BID BM  . insulin aspart  0-10 Units Subcutaneous TID AC & HS  . LORazepam  1 mg Oral QHS  . metoprolol tartrate  25 mg Oral BID  . ondansetron (ZOFRAN) IV  4 mg Intravenous 6 times per day  . pantoprazole  40 mg Oral BID  . polyethylene glycol  17 g Oral Daily  . tuberculin  5 Units Intradermal Once   sodium chloride, sodium chloride, acetaminophen,  ALPRAZolam, alum & mag hydroxide-simeth, docusate sodium, metoCLOPramide (REGLAN) injection, morphine injection, ondansetron (ZOFRAN) IV, simethicone, sodium chloride  Assessment/ Plan:  44 y.o. 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   1. 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 Multiple contrast exposure this admission. Baseline creatinine of 0.99. 12/20/2014 - Creatinine and BUN remain critically elevated. First hemodialysis on 5/3 - 5 hemodialysis treatments since admission.  - dialysis was held over the weekend; No recovery of renal function.     - will monitor volume status, urine output and renal function closely.  -  dose all medications for Cr Cl < 10. Avoid nephrotoxic agents.  -  Start outpatient d/c planning- d/w patient pathway team -  Perncath and PD cathter placement once anticoagulation can be held safely  2. Peripheral edema - remove fluid with HD as tolerated   3. Acute thrombosis I82.412: currently on apixaban  Has IVC filter in place  4. Anemia- check iron studies with next HD    LOS: 11 Guiseppe Flanagan 5/11/20163:21 PM

## 2015-01-08 NOTE — Progress Notes (Signed)
   01/08/15 1400  Clinical Encounter Type  Visited With Patient  Visit Type Follow-up  Referral From Other (Comment)  Consult/Referral To None  Spiritual Encounters  Spiritual Needs Emotional  Stress Factors  Patient Stress Factors Major life changes  Family Stress Factors Major life changes  Advance Directives (For Healthcare)  Does patient have an advance directive? No  Would patient like information on creating an advanced directive? No - patient declined information   Chaplain provided therapeutic presence and emotional support and empathic listening. AD [phone]

## 2015-01-08 NOTE — Plan of Care (Signed)
Problem: Discharge Progression Outcomes Goal: Discharge plan in place and appropriate Individualization  Individualization   Patient prefers to be called Ryan [name]. 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 History of HTN and metabolic syndrome controlled by home meds. Moderate fall risk - no bed alarm activated, hourly rounding performed Goal: Other Discharge Outcomes/Goals Outcome: Progressing Tylenol given for headache with improvement. - Continues on scheduled zofran for nausea. Will continue to monitor.

## 2015-01-09 LAB — BASIC METABOLIC PANEL
Anion gap: 12 (ref 5–15)
BUN: 67 mg/dL — AB (ref 6–20)
CALCIUM: 8 mg/dL — AB (ref 8.9–10.3)
CO2: 29 mmol/L (ref 22–32)
CREATININE: 14.57 mg/dL — AB (ref 0.61–1.24)
Chloride: 100 mmol/L — ABNORMAL LOW (ref 101–111)
GFR calc non Af Amer: 3 mL/min — ABNORMAL LOW (ref >60)
GFR, EST AFRICAN AMERICAN: 4 mL/min — AB (ref >60)
Glucose, Bld: 106 mg/dL — ABNORMAL HIGH (ref 65–99)
POTASSIUM: 4.5 mmol/L (ref 3.5–5.1)
Sodium: 141 mmol/L (ref 135–145)

## 2015-01-09 LAB — GLUCOSE, CAPILLARY
Glucose-Capillary: 101 mg/dL — ABNORMAL HIGH (ref 65–99)
Glucose-Capillary: 72 mg/dL (ref 65–99)
Glucose-Capillary: 100 mg/dL — ABNORMAL HIGH (ref 65–99)
Glucose-Capillary: 105 mg/dL — ABNORMAL HIGH (ref 65–99)

## 2015-01-09 NOTE — Care Management Note (Signed)
[facility] and wishes to switch to the new [facility] once it is opened.  I currently have a tentitive schedule of TTS at 11:30 which suits the patients needs.  All records needed for admission to clinic are completed.

## 2015-01-09 NOTE — Plan of Care (Signed)
Problem: Discharge Progression Outcomes Goal: Other Discharge Outcomes/Goals Outcome: Progressing Pt had dialysis today, pulled 2L off. Planned to be d/c'd home once stable. BP slightly elevated. Pt very flat affect, does not want to move out of bed much. Per Dr. Thedore MinsSingh, patient will be getting a permcath tomorrow, eliquis discontinued this am.

## 2015-01-09 NOTE — Progress Notes (Signed)
Post hd tx 

## 2015-01-09 NOTE — Progress Notes (Signed)
HD tx completed.

## 2015-01-09 NOTE — Plan of Care (Signed)
Problem: Discharge Progression Outcomes Goal: Discharge plan in place and appropriate Individualization  Outcome: Progressing Patient prefers to be called Ryan [name]. 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 History of HTN and metabolic syndrome controlled by home meds. Moderate fall risk - no bed alarm activated, hourly rounding performed Goal: Other Discharge Outcomes/Goals Goal: Other Discharge Outcomes/Goals Outcome: Progressing Plan of care progress to goal for: DVT - Continues on scheduled zofran for nausea. - Continues on scheduled eliquis. - Will continue to monitor.

## 2015-01-09 NOTE — Progress Notes (Signed)
[facility] [place] at [facility]   PATIENT NAME: Ryan GaussCharles Akhavan    MR#:  [id number]  DATE OF BIRTH:  01-29-1971  SUBJECTIVE:  CHIEF COMPLAINT:  No chief complaint on file.   no complaint except weakness.  On HD. REVIEW OF SYSTEMS:   CONSTITUTIONAL: Obese patient, No fever,  + fatigue + weakness.  EYES: No blurred or double vision.  EARS, NOSE, AND THROAT: No tinnitus or ear pain.  RESPIRATORY: No cough, shortness of breath, wheezing or hemoptysis.  CARDIOVASCULAR: No chest pain, orthopnea, edema.  GASTROINTESTINAL: No nausea, vomiting, diarrhea or abdominal pain.  GENITOURINARY: No dysuria, hematuria.  ENDOCRINE: No polyuria, nocturia,  HEMATOLOGY: No anemia, easy bruising or bleeding SKIN: No rash or lesion. MUSCULOSKELETAL: No joint pain or arthritis.   NEUROLOGIC: No tingling, numbness, weakness.  PSYCHIATRY: No anxiety or depression.   DRUG ALLERGIES:  No Known Allergies  VITALS:  Blood pressure 157/85, pulse 70, temperature 98.5 F (36.9 C), temperature source Oral, resp. rate 19, height 5' 6.97" (1.701 m), weight 119.4 kg (263 lb 3.7 oz), SpO2 97 %.  PHYSICAL EXAMINATION:   GENERAL:  44 y.o.-year-old obese patient lying in the bed with no acute distress.  EYES: Pupils equal, round, reactive to light and accommodation. No scleral icterus. Extraocular muscles intact.  HEENT: Head atraumatic, normocephalic. Oropharynx and nasopharynx clear.  NECK:  Supple, no jugular venous distention. No thyroid enlargement, no tenderness.  LUNGS: Normal breath sounds bilaterally, no wheezing, rales,rhonchi or crepitation. No use of accessory muscles of respiration.  CARDIOVASCULAR: S1, S2 normal. No murmurs, rubs, or gallops.  ABDOMEN: Soft, nontender, nondistended. Bowel sounds present. No organomegaly or mass.  EXTREMITIES: 2+ pedal edema with chronic skin changes, cyanosis, or clubbing.  NEUROLOGIC: Cranial nerves II through XII are intact. Muscle strength  5/5 in all extremities. Sensation intact. Gait not checked.  PSYCHIATRIC: The patient is alert and oriented x 3.  SKIN: No obvious rash, lesion, or ulcer.    LABORATORY PANEL:   CBC  Recent Labs Lab 01/06/15 1008  WBC 6.9  HGB 9.0*  HCT 27.2*  PLT 432   ------------------------------------------------------------------------------------------------------------------  Chemistries   Recent Labs Lab 01/09/15 0441  NA 141  K 4.5  CL 100*  CO2 29  GLUCOSE 106*  BUN 67*  CREATININE 14.57*  CALCIUM 8.0*   ------------------------------------------------------------------------------------------------------------------  Cardiac Enzymes No results for input(s): TROPONINI in the last 168 hours. ------------------------------------------------------------------------------------------------------------------  RADIOLOGY:  No results found.  EKG:  No orders found for this or any previous visit.  ASSESSMENT AND PLAN:   44 y.o. 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   1. Acute renal failure: [name]. Multiple contrast exposure this admission. Baseline creatinine of 0.99. -  Dialyzes  mulitple times during this hospitalization. The pt need permnant HD per Dr. Thedore MinsSingh. He will get perma cath tomorrow.  2. Acute thrombosis I82.412: off heparin gtt. Appreciate vascular input. S/p IVC filter. on apixaban - renally dosed.  3. Hypertension: well controlled. On metoprolol.   4. Hypercholesterolemia: not currently on a statin   5. Diabetes mellitus type II: on metformin as outpatient. Currently on insulin. Glucose well controlled.   6. GERD- protonix BID  7. Axiety d/o on xanax bid prn  D/w Dr. Thedore MinsSingh.  All the records are reviewed and case discussed with Care Management/Social  Workerr. Management plans discussed with the patient, family and they are in agreement.  CODE STATUS: FULL CODE  TOTAL TIME TAKING CARE OF THIS PATIENT: 36 min minutes.   POSSIBLE D/C home with HHPT IN 2 DAYS once setting up outpt HD chair. DEPENDING ON CLINICAL CONDITION.   [name], Cornellius Kropp M.D on 01/09/2015 at 2:32 PM  Between 7am to 6pm - Pager - [phone]  After 6pm go to [email or web address] - password [email or web address] [facility]  [facility]  Office  [phone]  CC: Primary care physician; No primary care provider on file.

## 2015-01-09 NOTE — Progress Notes (Signed)
PT Cancellation Note  Patient Details Name: Ryan [name] MRN: [id number] DOB: December 22, 1970   Cancelled Treatment:    Reason Eval/Treat Not Completed: Fatigue/lethargy limiting ability to participate (Pt just returned from dialysis"I have nothing to give today"). Pt notes he was wiped out before he went to dialysis and currently feels weaker. Attempt treatment tomorrow.    Elsie StainHeidi Elizabeth Bishop 01/09/2015, 2:12 PM

## 2015-01-09 NOTE — Progress Notes (Signed)
Subjective:    Patient seen during dialysis Tolerating well   Objective:  Vital signs in last 24 hours:  Temp:  [98.3 F (36.8 C)-99.9 F (37.7 C)] 98.3 F (36.8 C) (05/12 1000) Pulse Rate:  [50-86] 63 (05/12 1300) Resp:  [14-18] 16 (05/12 1200) BP: (145-202)/(66-102) 171/93 mmHg (05/12 1200) SpO2:  [93 %-97 %] 96 % (05/12 0950) Weight:  [123.8 kg (272 lb 14.9 oz)] 123.8 kg (272 lb 14.9 oz) (05/12 1000)  Weight change:  Filed Weights   01/08/15 1017 01/08/15 1330 01/09/15 1000  Weight: 122.5 kg (270 lb 1 oz) 123.8 kg (272 lb 14.9 oz) 123.8 kg (272 lb 14.9 oz)    Intake/Output: I/O last 3 completed shifts: In: 0  Out: 1446 [Urine:100; Other:1346]   Intake/Output this shift:     General: not in acute distress Head: [place]/AT,  moist oral mucosal membranes Neck: supple, trachea midline CVS: regular rate and rhythm Resp: clear  ABD: soft, nontender, +obese EXT: 2+ edema Neuro: nonfocal, depressed affect Access: LIJ vascath Dr. Gilda CreaseSchnier 5/3   Basic Metabolic Panel:  Recent Labs Lab 01/03/15 0027  01/05/15 0544 01/06/15 1008 01/07/15 0744 01/07/15 1315 01/09/15 0441  NA 138  < > 137 137 138 136 141  K 4.4  < > 4.6 4.5 4.6 4.4 4.5  CL 100*  < > 98* 97* 97* 97* 100*  CO2 28  < > 28 26 27 27 29   GLUCOSE 113*  < > 105* 121* 105* 122* 106*  BUN 66*  < > 72* 93* 73* 75* 67*  CREATININE 12.10*  < > 13.57* 16.90* 14.85* 15.42* 14.57*  CALCIUM 8.0*  < > 8.1* 8.1* 8.2* 7.7* 8.0*  PHOS 6.2*  --  7.8*  --  7.8* 7.9*  --   < > = values in this interval not displayed.  Liver Function Tests:  Recent Labs Lab 01/03/15 0027 01/05/15 0544 01/07/15 0744 01/07/15 1315  ALBUMIN 2.9* 2.9* 3.1* 2.8*   No results for input(s): LIPASE, AMYLASE in the last 168 hours. No results for input(s): AMMONIA in the last 168 hours.  CBC:  Recent Labs Lab 01/03/15 0028 01/05/15 0543 01/06/15 1008  WBC 7.6 7.0 6.9  HGB 8.8* 8.7* 9.0*  HCT 26.8* 26.3* 27.2*  MCV 90.0 90.3  89.8  PLT 299 381 432    Cardiac Enzymes: No results for input(s): CKTOTAL, CKMB, CKMBINDEX, TROPONINI in the last 168 hours.  BNP: Invalid input(s): POCBNP  CBG:  Recent Labs Lab 01/08/15 0712 01/08/15 1356 01/08/15 1631 01/08/15 2212 01/09/15 0715  GLUCAP 100* 119* 110* 112* 101*    Microbiology: Results for orders placed or performed during the hospital encounter of 12/28/14  Culture, blood (single)     Status: None (Preliminary result)   Collection Time: 12/27/14  8:34 PM  Result Value Ref Range Status   Micro Text Report   Preliminary       COMMENT                   NO GROWTH IN 36 HOURS   ANTIBIOTIC                                                      Culture, blood (single)     Status: None (Preliminary result)   Collection Time: 12/27/14  8:40 PM  Result Value Ref Range Status   Micro Text Report   Preliminary       COMMENT                   NO GROWTH IN 36 HOURS   ANTIBIOTIC                                                        Coagulation Studies: No results for input(s): LABPROT, INR in the last 72 hours.  Urinalysis: No results for input(s): COLORURINE, LABSPEC, PHURINE, GLUCOSEU, HGBUR, BILIRUBINUR, KETONESUR, PROTEINUR, UROBILINOGEN, NITRITE, LEUKOCYTESUR in the last 72 hours.  Invalid input(s): APPERANCEUR    Imaging: No results found.   Medications:     . feeding supplement (NEPRO CARB STEADY)  237 mL Oral BID BM  . insulin aspart  0-10 Units Subcutaneous TID AC & HS  . LORazepam  1 mg Oral QHS  . metoprolol tartrate  25 mg Oral BID  . ondansetron (ZOFRAN) IV  4 mg Intravenous 6 times per day  . pantoprazole  40 mg Oral BID  . polyethylene glycol  17 g Oral Daily  . tuberculin  5 Units Intradermal Once   sodium chloride, sodium chloride, acetaminophen, ALPRAZolam, alum & mag hydroxide-simeth, docusate sodium, metoCLOPramide (REGLAN) injection, morphine injection, ondansetron (ZOFRAN) IV, simethicone, sodium  chloride  Assessment/ Plan:  44 y.o. 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   1. 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 Multiple contrast exposure this admission. Baseline creatinine of 0.99. 12/20/2014 - Creatinine and BUN remain critically elevated. First hemodialysis on 5/ -  dose all medications for Cr Cl < 10. Avoid nephrotoxic agents.  -  outpatient d/c planning- d/w patient pathway team -  Perncath and PD cathter placement once anticoagulation can be held safely - Tentatively scheduled for Friday 2. Peripheral edema - remove fluid with HD as tolerated Fr  3. Acute thrombosis I82.412: currently on apixaban  Has IVC filter in place  4. Anemia  - Hgb 9.0  - monitor    LOS: 12 Shante Maysonet 5/12/20161:39 PM

## 2015-01-10 ENCOUNTER — Encounter
Disposition: A | Discharge status: Skilled Nursing Facility | Payer: 59 | Source: Home / Self Care | Attending: Internal Medicine

## 2015-01-10 HISTORY — PX: PERIPHERAL VASCULAR CATHETERIZATION: SHX172C

## 2015-01-10 LAB — CBC
HEMATOCRIT: 26.9 % — AB (ref 40.0–52.0)
Hemoglobin: 8.9 g/dL — ABNORMAL LOW (ref 13.0–18.0)
MCH: 30 pg (ref 26.0–34.0)
MCHC: 33.1 g/dL (ref 32.0–36.0)
MCV: 90.7 fL (ref 80.0–100.0)
PLATELETS: 344 10*3/uL (ref 150–440)
RBC: 2.97 MIL/uL — ABNORMAL LOW (ref 4.40–5.90)
RDW: 13.9 % (ref 11.5–14.5)
WBC: 5.5 10*3/uL (ref 3.8–10.6)

## 2015-01-10 LAB — GLUCOSE, CAPILLARY
Glucose-Capillary: 104 mg/dL — ABNORMAL HIGH (ref 65–99)
Glucose-Capillary: 84 mg/dL (ref 65–99)
Glucose-Capillary: 121 mg/dL — ABNORMAL HIGH (ref 65–99)

## 2015-01-10 SURGERY — DIALYSIS/PERMA CATHETER INSERTION
Anesthesia: Moderate Sedation

## 2015-01-10 MED ORDER — FENTANYL CITRATE (PF) 100 MCG/2ML IJ SOLN
50.0000 ug | Freq: Once | INTRAMUSCULAR | Status: AC
  Administered 2015-01-10: 50 ug via INTRAVENOUS

## 2015-01-10 MED ORDER — LIDOCAINE HCL (PF) 1 % IJ SOLN
0.0000 mL | Freq: Once | INTRAMUSCULAR | Status: AC | PRN
  Filled 2015-01-10: qty 30

## 2015-01-10 MED ORDER — MIDAZOLAM HCL 5 MG/ML IJ SOLN
1.0000 mg | Freq: Once | INTRAMUSCULAR | Status: AC
  Administered 2015-01-10: 1 mg via INTRAVENOUS

## 2015-01-10 MED ORDER — LIDOCAINE-EPINEPHRINE (PF) 1 %-1:200000 IJ SOLN
INTRAMUSCULAR | Status: AC
  Filled 2015-01-10: qty 30

## 2015-01-10 MED ORDER — HEPARIN SODIUM (PORCINE) 10000 UNIT/ML IJ SOLN
INTRAMUSCULAR | Status: AC
  Filled 2015-01-10: qty 1

## 2015-01-10 MED ORDER — MIDAZOLAM HCL 2 MG/2ML IJ SOLN
INTRAMUSCULAR | Status: AC
  Filled 2015-01-10: qty 2

## 2015-01-10 MED ORDER — FENTANYL CITRATE (PF) 100 MCG/2ML IJ SOLN
INTRAMUSCULAR | Status: AC
  Filled 2015-01-10: qty 2

## 2015-01-10 MED ORDER — EPOETIN ALFA 10000 UNIT/ML IJ SOLN: 10000.0000 [IU] | INTRAMUSCULAR | Status: DC

## 2015-01-10 MED ORDER — HEPARIN (PORCINE) IN NACL 2-0.9 UNIT/ML-% IJ SOLN
INTRAMUSCULAR | Status: AC
  Filled 2015-01-10: qty 500

## 2015-01-10 MED ORDER — MIDAZOLAM HCL 5 MG/ML IJ SOLN
2.0000 mg | Freq: Once | INTRAMUSCULAR | Status: AC
  Administered 2015-01-10: 2 mg via INTRAVENOUS

## 2015-01-10 MED ORDER — LIDOCAINE HCL (PF) 1 % IJ SOLN
INTRAMUSCULAR | Status: DC | PRN
  Administered 2015-01-10: 35 mL via SUBCUTANEOUS

## 2015-01-10 MED ORDER — CEFAZOLIN SODIUM 1-5 GM-% IV SOLN
1.0000 g | Freq: Once | INTRAVENOUS | Status: AC
  Administered 2015-01-10: 1 g via INTRAVENOUS

## 2015-01-10 SURGICAL SUPPLY — 3 items
CATH CANNON HEMO 15FR 19 (HEMODIALYSIS SUPPLIES) ×3 IMPLANT
PACK ANGIOGRAPHY (CUSTOM PROCEDURE TRAY) ×3 IMPLANT
SET INTRO CAPELLA COAXIAL (SET/KITS/TRAYS/PACK) ×3 IMPLANT

## 2015-01-10 NOTE — Op Note (Signed)
OPERATIVE NOTE   PROCEDURE: 1. Insertion of temporary dialysis catheter catheter right internal jugular approach.  PRE-OPERATIVE DIAGNOSIS: Acute renal failure  POST-OPERATIVE DIAGNOSIS: Same  SURGEON: Renford DillsGregory G Nikolai Wilczak M.D.  ANESTHESIA: 1% lidocaine local infiltration  ESTIMATED BLOOD LOSS: Minimal cc  INDICATIONS:   Ryan SilversmithCharles A Malachi is a 44 y.o. male who presents with extensive DVT. He subsequently underwent thrombolysis. Subsequent to this he has developed oliguric renal failure. He has been maintained for approximately 1 week with a temporary catheter but does not appear to be improving and his renal status and therefore he is undergoing placement of a tunneled catheter. The risks and benefits were reviewed all questions were answered patient has agreed to proceed.  DESCRIPTION: After obtaining full informed written consent, the patient was positioned supine. The right neck was prepped and draped in a sterile fashion. Ultrasound was placed in a sterile sleeve. Ultrasound was utilized to identify the internal jugular vein which is noted to be echolucent and compressible indicating patency. Images recorded for the permanent record. Under real-time visualization a Seldinger needle is inserted into the vein and the guidewires advanced without difficulty. Small counterincision was made at the wire insertion site. Dilators are passed over the wire and the tunneled dialysis catheter catheter is fed into the venous system.  Under fluoroscopy the catheter tips were positioned at the atrial caval junction and an exit site is selected on the chest wall. 1% lidocaine is obtained soft tissues of the chest wall to the base of the neck small counter incision is created and the tunneling device is passed from the exit site incision to the neck counterincision. Catheter is then pulled through the subcutaneous tunnel it is transected and the hub assembly was connected without difficulty.  Both lumens aspirate  and flush easily under fluoroscopy the catheter has a smooth contour with the tips in excellent position. Both lumens are then packed with 5000 units of heparin per lumen.  Catheter secured to the skin of the chest with 0 silk. A sterile dressing is applied with Biopatch. The patient tolerated the procedure well   COMPLICATIONS: None  CONDITION: Good  Renford DillsGregory G Abdulkadir Emmanuel, M.D.  renovascular. Office:  [phone]

## 2015-01-10 NOTE — Plan of Care (Signed)
Problem: Discharge Progression Outcomes Goal: Discharge plan in place and appropriate Individualization  Outcome: Progressing Patient prefers to be called Ryan [name]. 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 History of HTN and metabolic syndrome controlled by home meds. Moderate fall risk - no bed alarm activated, hourly rounding performed Goal: Other Discharge Outcomes/Goals Plan of care progress to goal for: DVT - Continues on scheduled zofran for nausea. - Possible perm cath for dialysis placement today. - Will continue to monitor.

## 2015-01-10 NOTE — Progress Notes (Signed)
Report called to floor nurse

## 2015-01-10 NOTE — Care Management Note (Signed)
I have left a message for patient on his cell number that I need his SS to complete his DVA admission.  Reaching out to CM for that into.  It does not show in my EPIC view. Schedule is TTS [facility] [address] at 11:30

## 2015-01-10 NOTE — Progress Notes (Signed)
[facility] - [place] at [facility]   PATIENT NAME: Ryan [name]    MR#:  [id number]  DATE OF BIRTH:  Mar 09, 1971  SUBJECTIVE:  CHIEF COMPLAINT:  No chief complaint on file.   no complaint except weakness.  REVIEW OF SYSTEMS:   CONSTITUTIONAL: Obese patient, No fever,  + fatigue + weakness.  EYES: No blurred or double vision.  EARS, NOSE, AND THROAT: No tinnitus or ear pain.  RESPIRATORY: No cough, shortness of breath, wheezing or hemoptysis.  CARDIOVASCULAR: No chest pain, orthopnea, edema.  GASTROINTESTINAL: No nausea, vomiting, diarrhea or abdominal pain.  GENITOURINARY: No dysuria, hematuria.  ENDOCRINE: No polyuria, nocturia,  HEMATOLOGY: No anemia, easy bruising or bleeding SKIN: No rash or lesion. MUSCULOSKELETAL: No joint pain or arthritis.   NEUROLOGIC: No tingling, numbness, weakness.  PSYCHIATRY: No anxiety or depression.   DRUG ALLERGIES:   Allergies  Allergen Reactions  . Ivp Dye [Iodinated Diagnostic Agents]     Kidney failure    VITALS:  Blood pressure 162/94, pulse 70, temperature 98.9 F (37.2 C), temperature source Oral, resp. rate 23, height 5' 6.97" (1.701 m), weight 119.4 kg (263 lb 3.7 oz), SpO2 92 %.  PHYSICAL EXAMINATION:   GENERAL:  44 y.o.-year-old obese patient lying in the bed with no acute distress.  EYES: Pupils equal, round, reactive to light and accommodation. No scleral icterus. Extraocular muscles intact.  HEENT: Head atraumatic, normocephalic. Oropharynx and nasopharynx clear.  NECK:  Supple, no jugular venous distention. No thyroid enlargement, no tenderness.  LUNGS: Normal breath sounds bilaterally, no wheezing, rales,rhonchi or crepitation. No use of accessory muscles of respiration.  CARDIOVASCULAR: S1, S2 normal. No murmurs, rubs, or gallops.  ABDOMEN: Soft, nontender, nondistended. Bowel sounds present. No organomegaly or mass.  EXTREMITIES: 2+ pedal edema with chronic skin changes, cyanosis, or clubbing.   NEUROLOGIC: Cranial nerves II through XII are intact. Muscle strength 5/5 in all extremities. Sensation intact. Gait not checked.  PSYCHIATRIC: The patient is alert and oriented x 3.  SKIN: No obvious rash, lesion, or ulcer.    LABORATORY PANEL:   CBC  Recent Labs Lab 01/06/15 1008  WBC 6.9  HGB 9.0*  HCT 27.2*  PLT 432   ------------------------------------------------------------------------------------------------------------------  Chemistries   Recent Labs Lab 01/09/15 0441  NA 141  K 4.5  CL 100*  CO2 29  GLUCOSE 106*  BUN 67*  CREATININE 14.57*  CALCIUM 8.0*   ------------------------------------------------------------------------------------------------------------------  Cardiac Enzymes No results for input(s): TROPONINI in the last 168 hours. ------------------------------------------------------------------------------------------------------------------  RADIOLOGY:  No results found.  EKG:  No orders found for this or any previous visit.  ASSESSMENT AND PLAN:   44 y.o. 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   1. Acute renal failure, progressed to ESRD: . Multiple contrast exposure this admission. Baseline creatinine of 0.99. -  Dialyzes  mulitple times during this hospitalization. The pt need permnant HD per Dr. Thedore MinsSingh. He will get perma cath today. 2. Acute thrombosis I82.412: off heparin gtt. Appreciate vascular input. S/p IVC filter. on apixaban - renally dosed. Off apixaban for perma cath.  3. Hypertension: well controlled. On metoprolol.   4. Hypercholesterolemia: not currently on a statin   5. Diabetes mellitus type II: on metformin as outpatient. Currently on insulin. Glucose well controlled.   6. GERD- protonix BID  7. Axiety  d/o on  xanax bid prn  D/w Dr. Thedore MinsSingh. D/w pt's wife.  Possible need SNF. PT reevaluation.  All the records are reviewed and case discussed with Care Management/Social Workerr. Management plans discussed with the patient, family and they are in agreement.  CODE STATUS: FULL CODE  TOTAL TIME TAKING CARE OF THIS PATIENT: 38 min minutes.   POSSIBLE D/C home with HHPT IN 2 DAYS once setting up outpt HD chair. DEPENDING ON CLINICAL CONDITION.   [name], [name] M.D on 01/10/2015 at 2:25 PM  Between 7am to 6pm - Pager - [phone]  After 6pm go to [email or web address] - password [email or web address] [facility]  [facility]  Office  [phone]  CC: Primary care physician; No primary care provider on file.

## 2015-01-10 NOTE — Care Management (Signed)
Spoke with [name] and wife, [name], at the bedside. States they have concerns about going home with home health and physical therapy in the home. Discussed getting another physical therapy evaluation to determine discharge needs. Nurse Dedra call Dr, Thedore MinsSingh. Permacath will be done this afternoon.  Gwenette GreetBrenda S Holland RN MSN Care Management [phone]

## 2015-01-10 NOTE — Progress Notes (Signed)
Subjective:   Met with patient and his wife.  Continues to report weakness. Has not walked much States he is urinating more but S Cr remains > 14   Objective:  Vital signs in last 24 hours:  Temp:  [97.8 F (36.6 C)-98.9 F (37.2 C)] 98.9 F (37.2 C) (05/13 0526) Pulse Rate:  [68-85] 79 (05/13 0526) Resp:  [19-20] 20 (05/12 2153) BP: (144-178)/(82-98) 155/89 mmHg (05/13 0526) SpO2:  [91 %-98 %] 93 % (05/13 0526) Weight:  [119.4 kg (263 lb 3.7 oz)] 119.4 kg (263 lb 3.7 oz) (05/12 1340)  Weight change: 1.3 kg (2 lb 13.9 oz) Filed Weights   01/08/15 1330 01/09/15 1000 01/09/15 1340  Weight: 123.8 kg (272 lb 14.9 oz) 123.8 kg (272 lb 14.9 oz) 119.4 kg (263 lb 3.7 oz)    Intake/Output: I/O last 3 completed shifts: In: -  Out: 2100 [Urine:100; Other:2000]   Intake/Output this shift:  Total I/O In: 240 [P.O.:240] Out: 200 [Urine:200]  General: not in acute distress Head: [place]/AT,  moist oral mucosal membranes Neck: supple, trachea midline CVS: regular rate and rhythm Resp: clear  ABD: soft, nontender, +obese EXT: 2+ edema Neuro: nonfocal, depressed affect Access: LIJ vascath Dr. Delana Meyer 5/3   Basic Metabolic Panel:  Recent Labs Lab 01/05/15 0544 01/06/15 1008 01/07/15 0744 01/07/15 1315 01/09/15 0441  NA 137 137 138 136 141  K 4.6 4.5 4.6 4.4 4.5  CL 98* 97* 97* 97* 100*  CO2 28 26 27 27 29   GLUCOSE 105* 121* 105* 122* 106*  BUN 72* 93* 73* 75* 67*  CREATININE 13.57* 16.90* 14.85* 15.42* 14.57*  CALCIUM 8.1* 8.1* 8.2* 7.7* 8.0*  PHOS 7.8*  --  7.8* 7.9*  --     Liver Function Tests:  Recent Labs Lab 01/05/15 0544 01/07/15 0744 01/07/15 1315  ALBUMIN 2.9* 3.1* 2.8*   No results for input(s): LIPASE, AMYLASE in the last 168 hours. No results for input(s): AMMONIA in the last 168 hours.  CBC:  Recent Labs Lab 01/05/15 0543 01/06/15 1008  WBC 7.0 6.9  HGB 8.7* 9.0*  HCT 26.3* 27.2*  MCV 90.3 89.8  PLT 381 432    Cardiac Enzymes: No  results for input(s): CKTOTAL, CKMB, CKMBINDEX, TROPONINI in the last 168 hours.  BNP: Invalid input(s): POCBNP  CBG:  Recent Labs Lab 01/09/15 0715 01/09/15 1400 01/09/15 1609 01/09/15 2209 01/10/15 0723  GLUCAP 101* 72 100* 105* 104*    Microbiology: Results for orders placed or performed during the hospital encounter of 12/28/14  Culture, blood (single)     Status: None (Preliminary result)   Collection Time: 12/27/14  8:34 PM  Result Value Ref Range Status   Micro Text Report   Preliminary       COMMENT                   NO GROWTH IN 36 HOURS   ANTIBIOTIC                                                      Culture, blood (single)     Status: None (Preliminary result)   Collection Time: 12/27/14  8:40 PM  Result Value Ref Range Status   Micro Text Report   Preliminary       COMMENT  NO GROWTH IN 36 HOURS   ANTIBIOTIC                                                        Coagulation Studies: No results for input(s): LABPROT, INR in the last 72 hours.  Urinalysis: No results for input(s): COLORURINE, LABSPEC, PHURINE, GLUCOSEU, HGBUR, BILIRUBINUR, KETONESUR, PROTEINUR, UROBILINOGEN, NITRITE, LEUKOCYTESUR in the last 72 hours.  Invalid input(s): APPERANCEUR    Imaging: No results found.   Medications:     . feeding supplement (NEPRO CARB STEADY)  237 mL Oral BID BM  . insulin aspart  0-10 Units Subcutaneous TID AC & HS  . LORazepam  1 mg Oral QHS  . metoprolol tartrate  25 mg Oral BID  . ondansetron (ZOFRAN) IV  4 mg Intravenous 6 times per day  . pantoprazole  40 mg Oral BID  . polyethylene glycol  17 g Oral Daily   sodium chloride, sodium chloride, acetaminophen, ALPRAZolam, alum & mag hydroxide-simeth, docusate sodium, metoCLOPramide (REGLAN) injection, morphine injection, ondansetron (ZOFRAN) IV, simethicone, sodium chloride  Assessment/ Plan:  44 y.o. 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   1. 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 Baseline creatinine of 0.99. 12/20/2014 -  Creatinine and BUN remain critically elevated. First hemodialysis on 5/3 -  dose all medications for Cr Cl < 10. Avoid nephrotoxic agents.  -  outpatient d/c planning- d/w patient pathway team- set up for [facility]  Placement today. PD cathter deferred for later.   2. Peripheral edema - remove fluid with HD as tolerated   3. Acute thrombosis I82.412: currently on apixaban  Has IVC filter in place  4. Anemia  - Hgb 9.0  - monitor - low dose procrit    LOS: 13 Addalyne Vandehei 5/13/20161:03 PM

## 2015-01-10 NOTE — Progress Notes (Signed)
Nutrition Follow-up  INTERVENTION: Education: pt out of room on visit. RD left written materials for nutrition therapy for a pt starting on HD with empahsis on sodium, phosphorus, and potassium. RD also left recommended grocery list. Will verbally educate on follow. Meals and snacks: Cater to pt preferences Medical Nutritional Supplement: Recommend continuing nepro BID for added nutrition  NUTRITION DIAGNOSIS:  Inadequate oral intake related to acute illness as evidenced by per patient/family report, other (see comment).  GOAL:  Patient will meet greater than or equal to 90% of their needs; ongoing  MONITOR: Energy intake  Electrolyte and renal Profile  Anthropometrics  REASON FOR ASSESSMENT:  Other (Comment) (LOS)    ASSESSMENT:  Per MD note pt to continue on HD at this time. Pt scheduled for permcath placement today. PO Intake: pt NPO for procedure today. Limited documentation of intake in I and O chart.   Medications: reviewed Labs:  Electrolyte and Renal Profile:    Recent Labs Lab 01/05/15 0544  01/07/15 0744 01/07/15 1315 01/09/15 0441  BUN 72*  < > 73* 75* 67*  CREATININE 13.57*  < > 14.85* 15.42* 14.57*  NA 137  < > 138 136 141  K 4.6  < > 4.6 4.4 4.5  PHOS 7.8*  --  7.8* 7.9*  --   < > = values in this interval not displayed. Glucose Profile:  Recent Labs  01/09/15 1609 01/09/15 2209 01/10/15 0723  GLUCAP 100* 105* 104*  . Protein Profile:  Recent Labs Lab 01/05/15 0544 01/07/15 0744 01/07/15 1315  ALBUMIN 2.9* 3.1* 2.8*     Height:  Ht Readings from Last 1 Encounters:  12/27/14 5' 6.97" (1.701 m)    Weight:  Wt Readings from Last 1 Encounters:  01/09/15 263 lb 3.7 oz (119.4 kg)    Ideal Body Weight:  67 kg  Wt Readings from Last 10 Encounters:  01/09/15 263 lb 3.7 oz (119.4 kg)    BMI:  Body mass index is 41.27 kg/(m^2).  Estimated Nutritional Needs:  Kcal:  1816-2179 kcal/day (BEE: 1513 x 1.2 AF x 1.0-1.2 IF)- using  IBW  Protein:  80-94 g Pro/day (1.2-1.4 g/ kg IBW/ day)- using IBW  Fluid:  1675-2010 ml/ day (25-30 ml/ kg IBW)- using IBW  Skin:  Reviewed, no issues  Diet Order:  Diet NPO time specified Except for: Sips with Meds  EDUCATION NEEDS:  No education needs identified at this time   Intake/Output Summary (Last 24 hours) at 01/10/15 1513 Last data filed at 01/10/15 1234  Gross per 24 hour  Intake    240 ml  Output    200 ml  Net     40 ml    Last BM:  5/12   LOW Care Level  Leda QuailAllyson Merl Guardino, RD, LDN Pager [phone]

## 2015-01-11 LAB — RENAL FUNCTION PANEL
ANION GAP: 12 (ref 5–15)
Albumin: 3.1 g/dL — ABNORMAL LOW (ref 3.5–5.0)
BUN: 63 mg/dL — AB (ref 6–20)
CO2: 30 mmol/L (ref 22–32)
CREATININE: 14.74 mg/dL — AB (ref 0.61–1.24)
Calcium: 8.1 mg/dL — ABNORMAL LOW (ref 8.9–10.3)
Chloride: 100 mmol/L — ABNORMAL LOW (ref 101–111)
GFR calc non Af Amer: 3 mL/min — ABNORMAL LOW (ref >60)
GFR, EST AFRICAN AMERICAN: 4 mL/min — AB (ref >60)
GLUCOSE: 111 mg/dL — AB (ref 65–99)
Phosphorus: 7 mg/dL — ABNORMAL HIGH (ref 2.5–4.6)
Potassium: 4.7 mmol/L (ref 3.5–5.1)
Sodium: 142 mmol/L (ref 135–145)

## 2015-01-11 LAB — CBC
HEMATOCRIT: 25.7 % — AB (ref 40.0–52.0)
HEMOGLOBIN: 8.1 g/dL — AB (ref 13.0–18.0)
MCH: 29.2 pg (ref 26.0–34.0)
MCHC: 31.7 g/dL — ABNORMAL LOW (ref 32.0–36.0)
MCV: 92.2 fL (ref 80.0–100.0)
Platelets: 313 10*3/uL (ref 150–440)
RBC: 2.79 MIL/uL — ABNORMAL LOW (ref 4.40–5.90)
RDW: 14.1 % (ref 11.5–14.5)
WBC: 7.6 10*3/uL (ref 3.8–10.6)

## 2015-01-11 LAB — GLUCOSE, CAPILLARY
Glucose-Capillary: 109 mg/dL — ABNORMAL HIGH (ref 65–99)
Glucose-Capillary: 92 mg/dL (ref 65–99)
Glucose-Capillary: 103 mg/dL — ABNORMAL HIGH (ref 65–99)
Glucose-Capillary: 96 mg/dL (ref 65–99)

## 2015-01-11 MED ORDER — HYDRALAZINE HCL 25 MG PO TABS
25.0000 mg | ORAL_TABLET | Freq: Three times a day (TID) | ORAL | Status: DC
  Administered 2015-01-11 – 2015-01-13 (×6): 25 mg via ORAL
  Filled 2015-01-11 (×10): qty 1

## 2015-01-11 MED ORDER — ONDANSETRON HCL 4 MG PO TABS
4.0000 mg | ORAL_TABLET | Freq: Three times a day (TID) | ORAL | Status: DC | PRN
  Administered 2015-01-11 – 2015-01-15 (×4): 4 mg via ORAL
  Filled 2015-01-11 (×7): qty 1

## 2015-01-11 MED ORDER — APIXABAN 5 MG PO TABS
2.5000 mg | ORAL_TABLET | Freq: Two times a day (BID) | ORAL | Status: DC
  Administered 2015-01-11: 2.5 mg via ORAL
  Administered 2015-01-11: 5 mg via ORAL
  Administered 2015-01-12: 21:00:00 via ORAL
  Administered 2015-01-12 – 2015-01-13 (×2): 2.5 mg via ORAL
  Administered 2015-01-13: 20:00:00 via ORAL
  Administered 2015-01-14 – 2015-01-15 (×3): 2.5 mg via ORAL
  Filled 2015-01-11 (×12): qty 1

## 2015-01-11 NOTE — Progress Notes (Signed)
PRE HD   

## 2015-01-11 NOTE — Progress Notes (Signed)
POST HD 

## 2015-01-11 NOTE — Plan of Care (Signed)
Problem: Discharge Progression Outcomes Goal: Barriers To Progression Addressed/Resolved Individualization of Care:   Patient prefers to be called Ryan [name]. 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 History of HTN and metabolic syndrome controlled by home meds. Moderate fall risk - no bed alarm activated, hourly rounding performed    Goal: Other Discharge Outcomes/Goals

## 2015-01-11 NOTE — Progress Notes (Signed)
[facility] [place] at [facility]   PATIENT NAME: Ryan GaussCharles Rhoads    MR#:  [id number]  DATE OF BIRTH:  12-08-1970  SUBJECTIVE: Getting hemodialysis today. Feels weak. Heart no nausea no vomiting. His IJ  is pulled out.   CHIEF COMPLAINT:  No chief complaint on file.   no complaint except weakness.  REVIEW OF SYSTEMS:   CONSTITUTIONAL: Obese patient, No fever,  + fatigue + weakness.  EYES: No blurred or double vision.  EARS, NOSE, AND THROAT: No tinnitus or ear pain.  RESPIRATORY: No cough, shortness of breath, wheezing or hemoptysis.  CARDIOVASCULAR: No chest pain, orthopnea, edema.  GASTROINTESTINAL: No nausea, vomiting, diarrhea or abdominal pain.  GENITOURINARY: No dysuria, hematuria.  ENDOCRINE: No polyuria, nocturia,  HEMATOLOGY: No anemia, easy bruising or bleeding SKIN: No rash or lesion. MUSCULOSKELETAL: No joint pain or arthritis.   NEUROLOGIC: No tingling, numbness, weakness.  PSYCHIATRY: No anxiety or depression.   DRUG ALLERGIES:   Allergies  Allergen Reactions  . Ivp Dye [Iodinated Diagnostic Agents]     Kidney failure  . Clindamycin/Lincomycin Rash    VITALS:  Blood pressure 161/117, pulse 65, temperature 98.6 F (37 C), temperature source Oral, resp. rate 17, height 5' 6.97" (1.701 m), weight 119.4 kg (263 lb 3.7 oz), SpO2 96 %.  PHYSICAL EXAMINATION:   GENERAL:  44 y.o.-year-old obese patient lying in the bed with no acute distress.  EYES: Pupils equal, round, reactive to light and accommodation. No scleral icterus. Extraocular muscles intact.  HEENT: Head atraumatic, normocephalic. Oropharynx and nasopharynx clear.  NECK:  Supple, no jugular venous distention. No thyroid enlargement, no tenderness.  LUNGS: Normal breath sounds bilaterally, no wheezing, rales,rhonchi or crepitation. No use of accessory muscles of respiration.  CARDIOVASCULAR: S1, S2 normal. No murmurs, rubs, or gallops.  ABDOMEN: Soft, nontender, nondistended. Bowel  sounds present. No organomegaly or mass.  EXTREMITIES: 2+ pedal edema with chronic skin changes, cyanosis, or clubbing.  NEUROLOGIC: Cranial nerves II through XII are intact. Muscle strength 5/5 in all extremities. Sensation intact. Gait not checked.  PSYCHIATRIC: The patient is alert and oriented x 3.  SKIN: No obvious rash, lesion, or ulcer.    LABORATORY PANEL:   CBC  Recent Labs Lab 01/10/15 1815  WBC 5.5  HGB 8.9*  HCT 26.9*  PLT 344   ------------------------------------------------------------------------------------------------------------------  Chemistries   Recent Labs Lab 01/11/15 0552  NA 142  K 4.7  CL 100*  CO2 30  GLUCOSE 111*  BUN 63*  CREATININE 14.74*  CALCIUM 8.1*   ------------------------------------------------------------------------------------------------------------------  Cardiac Enzymes No results for input(s): TROPONINI in the last 168 hours. ------------------------------------------------------------------------------------------------------------------  RADIOLOGY:  No results found.  EKG:  No orders found for this or any previous visit.  ASSESSMENT AND PLAN:   44 y.o. 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   1. Acute renal failure, progressed to ESRD: . Multiple contrast exposure this admission. Baseline creatinine of 0.99. -  Dialyzes  mulitple times during this hospitalization. Had a permacath yesterday. He is being maintained on temporary catheter for dialysis for 1 week. Today he is getting hemodialysis through his permacath. 2. Acute thrombosis I82.412: off heparin gtt. Appreciate vascular input. S/p IVC filter. on apixaban - renally dosed. Restarted  eliquis. 3. Hypertension: well controlled. On metoprolol. Bloodpressure is  still high add hydralazine  25 mg 4 times a day.  4. Hypercholesterolemia: not currently on a statin   5. Diabetes mellitus type II: on metformin as outpatient. Currently on insulin. Glucose well controlled.   6. GERD- protonix BID  7. Axiety d/o on xanax bid prn   Possible need SNF. PT reevaluation.  All the records are reviewed and case discussed with Care Management/Social Workerr. Management plans discussed with the patient, family and they are in agreement.  CODE STATUS: FULL CODE  TOTAL TIME TAKING CARE OF THIS PATIENT: 38 min minutes.     Katha HammingKONIDENA,Durant Scibilia M.D on 01/11/2015 at 11:39 AM  Between 7am to 6pm - Pager - [phone]  After 6pm go to [email or web address] - password [email or web address] [facility]  [facility]  Office  [phone]  CC: Primary care physician; No primary care provider on file.

## 2015-01-11 NOTE — Progress Notes (Signed)
OK for patient to have no IV per Dr Luberta MutterKonidena

## 2015-01-11 NOTE — Progress Notes (Signed)
Subjective:   Continues to report weakness. Has not walked much permcath placed yesterday   Objective:  Vital signs in last 24 hours:  Temp:  [98.2 F (36.8 C)-99.4 F (37.4 C)] 98.6 F (37 C) (05/14 0915) Pulse Rate:  [62-85] 65 (05/14 1100) Resp:  [15-25] 16 (05/14 1100) BP: (150-192)/(88-119) 171/102 mmHg (05/14 1100) SpO2:  [91 %-96 %] 96 % (05/14 0915)  Weight change:  Filed Weights   01/08/15 1330 01/09/15 1000 01/09/15 1340  Weight: 123.8 kg (272 lb 14.9 oz) 123.8 kg (272 lb 14.9 oz) 119.4 kg (263 lb 3.7 oz)    Intake/Output: I/O last 3 completed shifts: In: 240 [P.O.:240] Out: 400 [Urine:400]   Intake/Output this shift:     General: not in acute distress Head: [place]/AT,  moist oral mucosal membranes Neck: supple, trachea midline CVS: regular rate and rhythm Resp: clear  ABD: soft, nontender, +obese EXT: 2+ edema Neuro: nonfocal, depressed affect Access: RIJ PERMcath Dr. Gilda CreaseSchnier 5/13   Basic Metabolic Panel:  Recent Labs Lab 01/05/15 0544 01/06/15 1008 01/07/15 0744 01/07/15 1315 01/09/15 0441 01/11/15 0552  NA 137 137 138 136 141 142  K 4.6 4.5 4.6 4.4 4.5 4.7  CL 98* 97* 97* 97* 100* 100*  CO2 28 26 27 27 29 30   GLUCOSE 105* 121* 105* 122* 106* 111*  BUN 72* 93* 73* 75* 67* 63*  CREATININE 13.57* 16.90* 14.85* 15.42* 14.57* 14.74*  CALCIUM 8.1* 8.1* 8.2* 7.7* 8.0* 8.1*  PHOS 7.8*  --  7.8* 7.9*  --  7.0*    Liver Function Tests:  Recent Labs Lab 01/05/15 0544 01/07/15 0744 01/07/15 1315 01/11/15 0552  ALBUMIN 2.9* 3.1* 2.8* 3.1*   No results for input(s): LIPASE, AMYLASE in the last 168 hours. No results for input(s): AMMONIA in the last 168 hours.  CBC:  Recent Labs Lab 01/05/15 0543 01/06/15 1008 01/10/15 1815  WBC 7.0 6.9 5.5  HGB 8.7* 9.0* 8.9*  HCT 26.3* 27.2* 26.9*  MCV 90.3 89.8 90.7  PLT 381 432 344    Cardiac Enzymes: No results for input(s): CKTOTAL, CKMB, CKMBINDEX, TROPONINI in the last 168  hours.  BNP: Invalid input(s): POCBNP  CBG:  Recent Labs Lab 01/09/15 2209 01/10/15 0723 01/10/15 1806 01/10/15 2216 01/11/15 0723  GLUCAP 105* 104* 84 121* 109*    Microbiology: Results for orders placed or performed during the hospital encounter of 12/28/14  Culture, blood (single)     Status: None (Preliminary result)   Collection Time: 12/27/14  8:34 PM  Result Value Ref Range Status   Micro Text Report   Preliminary       COMMENT                   NO GROWTH IN 36 HOURS   ANTIBIOTIC                                                      Culture, blood (single)     Status: None (Preliminary result)   Collection Time: 12/27/14  8:40 PM  Result Value Ref Range Status   Micro Text Report   Preliminary       COMMENT                   NO GROWTH IN 36 HOURS   ANTIBIOTIC  Coagulation Studies: No results for input(s): LABPROT, INR in the last 72 hours.  Urinalysis: No results for input(s): COLORURINE, LABSPEC, PHURINE, GLUCOSEU, HGBUR, BILIRUBINUR, KETONESUR, PROTEINUR, UROBILINOGEN, NITRITE, LEUKOCYTESUR in the last 72 hours.  Invalid input(s): APPERANCEUR    Imaging: No results found.   Medications:     . epoetin (EPOGEN/PROCRIT) injection  10,000 Units Subcutaneous Weekly  . feeding supplement (NEPRO CARB STEADY)  237 mL Oral BID BM  . insulin aspart  0-10 Units Subcutaneous TID AC & HS  . LORazepam  1 mg Oral QHS  . metoprolol tartrate  25 mg Oral BID  . ondansetron (ZOFRAN) IV  4 mg Intravenous 6 times per day  . pantoprazole  40 mg Oral BID  . polyethylene glycol  17 g Oral Daily   sodium chloride, sodium chloride, acetaminophen, ALPRAZolam, alum & mag hydroxide-simeth, docusate sodium, metoCLOPramide (REGLAN) injection, morphine injection, ondansetron (ZOFRAN) IV, simethicone, sodium chloride  Assessment/ Plan:  44 y.o. 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   1. 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 Multiple contrast exposure this admission. Baseline creatinine of 0.99. 12/20/2014 -  Creatinine and BUN remain critically elevated. First hemodialysis on 5/3 -  dose all medications for Cr Cl < 10. Avoid nephrotoxic agents.  -  outpatient d/c planning- d/w patient pathway team- set up for [facility] -  Permcath  Placed this admission. PD cathter deferred for later.  - heparin ordered during dialysis treatment  2. Peripheral edema - remove fluid with HD as tolerated   3. Acute thrombosis I82.412: currently on apixaban  Has IVC filter in place  4. Anemia  - Hgb 9.0  - monitor - low dose procrit    LOS: 14 Areil Ottey 5/14/201611:11 AM

## 2015-01-11 NOTE — Progress Notes (Signed)
HD END 

## 2015-01-11 NOTE — Plan of Care (Signed)
Problem: Discharge Progression Outcomes Goal: Discharge plan in place and appropriate Individualization  Outcome: Progressing Patient prefers to be called Ryan [name]. 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 History of HTN and metabolic syndrome controlled by home meds. Moderate fall risk - no bed alarm activated, hourly rounding performed Goal: Other Discharge Outcomes/Goals Outcome: Progressing Plan of Care Progressing to Goal: Renal function elevated. Pt had dialysis today, tolerated well. 2L pulled off. PRN zofran given for nausea. Pt tolerating diet well.

## 2015-01-12 LAB — GLUCOSE, CAPILLARY
Glucose-Capillary: 90 mg/dL (ref 65–99)
Glucose-Capillary: 146 mg/dL — ABNORMAL HIGH (ref 65–99)
Glucose-Capillary: 125 mg/dL — ABNORMAL HIGH (ref 65–99)
Glucose-Capillary: 115 mg/dL — ABNORMAL HIGH (ref 65–99)

## 2015-01-12 NOTE — Progress Notes (Signed)
Physical Therapy Treatment Patient Details Name: Ryan SilversmithCharles A [name] MRN: [id number] DOB: 1970-11-22 Today's Date: 01/12/2015    History of Present Illness 44 yo Male came to ED with acute DVT, edema and shortness of breath. Patient was started on Heparin drip  and on 4/28 underwent mechanical thrombectomy R/L venous system and IVC filter placement. Pateint has a PMH significant for right MCL tear. He also reports dizziness with positional change.    PT Comments    Patient feeling better today which he attributes to wearing oxygen mask all night. Patient not receiving dialysis today which is also a likely contributing factor. Ambulated 40 with RW and CGA. Pt will benefit from continued skilled PT services, increasing in safe and functional mobility within his physiological constraints. Recommend SNF placement for continued rehabilitation.  Follow Up Recommendations  SNF     Equipment Recommendations       Recommendations for Other Services       Precautions / Restrictions Restrictions Weight Bearing Restrictions: No    Mobility  Bed Mobility Overal bed mobility: Modified Independent                Transfers Overall transfer level: Needs assistance Equipment used: Rolling walker (2 wheeled) Transfers: Sit to/from Stand Sit to Stand: Min guard (with vc's to sequencing and hand placement)         General transfer comment: no dizziness reported with standing  Ambulation/Gait Ambulation/Gait assistance: Min guard Ambulation Distance (Feet): 40 Feet Assistive device: Rolling walker (2 wheeled) Gait Pattern/deviations: Decreased step length - right;Decreased step length - left;Step-to pattern (Flexed trunk, improves with vc's; increased lateral sway)   Gait velocity interpretation: <1.8 ft/sec, indicative of risk for recurrent falls     Stairs            Wheelchair Mobility    Modified Rankin (Stroke Patients Only)       Balance     Sitting balance-Leahy  Scale: Normal         Standing balance comment: CGA for safety, wide BoS                    Cognition Arousal/Alertness: Awake/alert Behavior During Therapy: WFL for tasks assessed/performed Overall Cognitive Status: Within Functional Limits for tasks assessed                      Exercises Other Exercises Other Exercises: Bilat ankle pumps x15, AROM with mild resistance bilaterally for knee flexion and extension.    General Comments        Pertinent Vitals/Pain Pain Assessment: No/denies pain    Home Living                      Prior Function            PT Goals (current goals can now be found in the care plan section) Acute Rehab PT Goals PT Goal Formulation: With patient/family Time For Goal Achievement: 01/19/15 Potential to Achieve Goals: Good Progress towards PT goals: Progressing toward goals    Frequency  Min 2X/week    PT Plan Discharge plan needs to be updated    Co-evaluation             End of Session Equipment Utilized During Treatment: Gait belt Activity Tolerance: Patient tolerated treatment well;Patient limited by fatigue Patient left: in chair     Time: 1241-1320 PT Time Calculation (min) (ACUTE ONLY): 39 min  Charges:  $Gait Training:  8-22 mins $Therapeutic Activity: 8-22 mins                    G Codes:     Ryan [name], PT, [address]  Ryan [name] 01/12/2015, 1:36 PM

## 2015-01-12 NOTE — Progress Notes (Signed)
Spoke to dr Anne Hahnwillis about Ryan [name] status on continuous pulse ox. Noted that when Ryan [name] goes to sleep he drops into 70's, 2 L o2 has been applied when patient is awake sats in upper 90's, md will place order for CPAP respiratory notifed.

## 2015-01-12 NOTE — Progress Notes (Signed)
Called to room by patient who was feeling sort of breath, 02 sats found to be 82 % while pt is talking o2 sats elevate to 96 % room air, he states that he feels like when he is sleeping that he stops breathing and wakes up gasping for air. spioke to Dr Anne Hahnwillis who will order tele with continous pulse ox to monitor episodes of apena.

## 2015-01-12 NOTE — Progress Notes (Signed)
Auto cpap not presently available. Pt initiated on pressure of 7cm. & 35% O2.

## 2015-01-12 NOTE — Progress Notes (Signed)
2 Days Post-Op  Subjective: Doing OK. Without complaints. Tol HD  Objective: Vital signs in last 24 hours: Temp:  [97.7 F (36.5 C)-99.1 F (37.3 C)] 98.2 F (36.8 C) (05/15 0517) Pulse Rate:  [65-83] 68 (05/15 0517) Resp:  [16-22] 16 (05/15 0517) BP: (146-171)/(92-117) 161/95 mmHg (05/15 0517) SpO2:  [82 %-99 %] 99 % (05/15 0517) FiO2 (%):  [35 %] 35 % (05/15 0330) Weight:  [118.9 kg (262 lb 2 oz)] 118.9 kg (262 lb 2 oz) (05/14 1200) Last BM Date: 01/10/15  Intake/Output from previous day: 05/14 0701 - 05/15 0700 In: -  Out: 2885 [Urine:550] Intake/Output this shift:    Gen: Alert, ND Permacath- dressing- C/D/I. Incisions -C/D/I  Lab Results:   Recent Labs  01/10/15 1815 01/11/15 1008  WBC 5.5 7.6  HGB 8.9* 8.1*  HCT 26.9* 25.7*  PLT 344 313   BMET  Recent Labs  01/11/15 0552  NA 142  K 4.7  CL 100*  CO2 30  GLUCOSE 111*  BUN 63*  CREATININE 14.74*  CALCIUM 8.1*   PT/INR No results for input(s): LABPROT, INR in the last 72 hours. ABG No results for input(s): PHART, HCO3 in the last 72 hours.  Invalid input(s): PCO2, PO2  Studies/Results: No results found.  Anti-infectives: Anti-infectives    Start     Dose/Rate Route Frequency Ordered Stop   01/10/15 1700  ceFAZolin (ANCEF) IVPB 1 g/50 mL premix     1 g 100 mL/hr over 30 Minutes Intravenous  Once 01/10/15 1656 01/10/15 1640      Assessment/Plan: s/p Procedure(s): Dialysis/Perma Catheter Insertion (N/A)  Continue HD, permacath working well   LOS: 15 days    Eli Hosesco, Orian Figueira A 01/12/2015

## 2015-01-12 NOTE — Progress Notes (Signed)
Auto cpap not presently available. Pt initiated on pressure of 7cm. & 35% O2. 

## 2015-01-12 NOTE — Progress Notes (Signed)
[facility] [place] at [facility]   PATIENT NAME: Ryan GaussCharles [name]    MR#:  [id number]  DATE OF BIRTH:  Mar 19, 1971  SUBJECTIVE:seen today,sleeping with cpap.denies any complaints/   CHIEF COMPLAINT:  No chief complaint on file.   no complaint except weakness.  REVIEW OF SYSTEMS:   CONSTITUTIONAL: Obese patient, No fever,  + fatigue + weakness.  EYES: No blurred or double vision.  EARS, NOSE, AND THROAT: No tinnitus or ear pain.  RESPIRATORY: No cough, shortness of breath, wheezing or hemoptysis.  CARDIOVASCULAR: No chest pain, orthopnea, edema.  GASTROINTESTINAL: No nausea, vomiting, diarrhea or abdominal pain.  GENITOURINARY: No dysuria, hematuria.  ENDOCRINE: No polyuria, nocturia,  HEMATOLOGY: No anemia, easy bruising or bleeding SKIN: No rash or lesion. MUSCULOSKELETAL: No joint pain or arthritis.   NEUROLOGIC: No tingling, numbness, weakness.  PSYCHIATRY: No anxiety or depression.   DRUG ALLERGIES:   Allergies  Allergen Reactions  . Ivp Dye [Iodinated Diagnostic Agents]     Kidney failure  . Clindamycin/Lincomycin Rash    VITALS:  Blood pressure 161/95, pulse 68, temperature 98.2 F (36.8 C), temperature source Oral, resp. rate 16, height 5' 6.97" (1.701 m), weight 118.9 kg (262 lb 2 oz), SpO2 99 %.  PHYSICAL EXAMINATION:   GENERAL:  44 y.o.-year-old obese patient lying in the bed with no acute distress.  EYES: Pupils equal, round, reactive to light and accommodation. No scleral icterus. Extraocular muscles intact.  HEENT: Head atraumatic, normocephalic. Oropharynx and nasopharynx clear.  NECK:  Supple, no jugular venous distention. No thyroid enlargement, no tenderness.  LUNGS: Normal breath sounds bilaterally, no wheezing, rales,rhonchi or crepitation. No use of accessory muscles of respiration.  CARDIOVASCULAR: S1, S2 normal. No murmurs, rubs, or gallops.  ABDOMEN: Soft, nontender, nondistended. Bowel sounds present. No organomegaly or mass.   EXTREMITIES: 2+ pedal edema with chronic skin changes, cyanosis, or clubbing.  NEUROLOGIC: Cranial nerves II through XII are intact. Muscle strength 5/5 in all extremities. Sensation intact. Gait not checked.  PSYCHIATRIC: The patient is alert and oriented x 3.  SKIN: No obvious rash, lesion, or ulcer.    LABORATORY PANEL:   CBC  Recent Labs Lab 01/11/15 1008  WBC 7.6  HGB 8.1*  HCT 25.7*  PLT 313   ------------------------------------------------------------------------------------------------------------------  Chemistries   Recent Labs Lab 01/11/15 0552  NA 142  K 4.7  CL 100*  CO2 30  GLUCOSE 111*  BUN 63*  CREATININE 14.74*  CALCIUM 8.1*   ------------------------------------------------------------------------------------------------------------------  Cardiac Enzymes No results for input(s): TROPONINI in the last 168 hours. ------------------------------------------------------------------------------------------------------------------  RADIOLOGY:  No results found.  EKG:  No orders found for this or any previous visit.  ASSESSMENT AND PLAN:   44 y.o. 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   1. Acute renal failure, progressed to ESRD: . Multiple contrast exposure this admission. Baseline creatinine of 0.99. -  Dialyzes  mulitple times during this hospitalization. Had a permacath yesterday. He is being maintained on temporary catheter for dialysis for 1 week. Today he is getting hemodialysis through his permacath. 2. Acute thrombosis I82.412: off heparin gtt. Appreciate vascular input. S/p IVC filter. on apixaban - renally dosed. Restarted  eliquis. 3. Hypertension: well controlled. On metoprolol. Started on hydralazine.bp better now. 4.  Hypercholesterolemia: not currently on a statin   5. Diabetes mellitus  type II: on metformin as outpatient. Currently on insulin. Glucose well controlled.   6. GERD- protonix BID  7. Axiety d/o on xanax bid prn   SNF placement  All the records are reviewed and case discussed with Care Management/Social Workerr. Management plans discussed with the patient, family and they are in agreement.  CODE STATUS: FULL CODE  TOTAL TIME TAKING CARE OF THIS PATIENT: 38 min minutes.     Katha HammingKONIDENA,Roberta Kelly M.D on 01/12/2015 at 8:26 AM  Between 7am to 6pm - Pager - [phone]  After 6pm go to [email or web address] - password [email or web address] [facility]  [facility] [facility]  Office  [phone]  CC: Primary care physician; No primary care provider on file.

## 2015-01-12 NOTE — Progress Notes (Signed)
Subjective:   Used CPAP last night. Feels more energetic today Sitting up Appetite appears to be improving   Objective:  Vital signs in last 24 hours:  Temp:  [97.7 F (36.5 C)-99.1 F (37.3 C)] 98.2 F (36.8 C) (05/15 0517) Pulse Rate:  [68-83] 68 (05/15 0517) Resp:  [16-22] 16 (05/15 0517) BP: (146-167)/(92-110) 161/95 mmHg (05/15 0517) SpO2:  [82 %-99 %] 99 % (05/15 0517) FiO2 (%):  [35 %] 35 % (05/15 0330) Weight:  [118.9 kg (262 lb 2 oz)] 118.9 kg (262 lb 2 oz) (05/14 1200)  Weight change:  Filed Weights   01/09/15 1000 01/09/15 1340 01/11/15 1200  Weight: 123.8 kg (272 lb 14.9 oz) 119.4 kg (263 lb 3.7 oz) 118.9 kg (262 lb 2 oz)    Intake/Output: I/O last 3 completed shifts: In: -  Out: 3085 [Urine:750; Other:2335]   Intake/Output this shift:     General: not in acute distress Head: [place]/AT,  moist oral mucosal membranes Neck: supple, trachea midline CVS: regular rate and rhythm Resp: clear  ABD: soft, nontender, +obese EXT: 2+ edema Neuro: nonfocal, depressed affect Access: RIJ PERMcath Dr. Gilda CreaseSchnier 5/13   Basic Metabolic Panel:  Recent Labs Lab 01/06/15 1008 01/07/15 0744 01/07/15 1315 01/09/15 0441 01/11/15 0552  NA 137 138 136 141 142  K 4.5 4.6 4.4 4.5 4.7  CL 97* 97* 97* 100* 100*  CO2 26 27 27 29 30   GLUCOSE 121* 105* 122* 106* 111*  BUN 93* 73* 75* 67* 63*  CREATININE 16.90* 14.85* 15.42* 14.57* 14.74*  CALCIUM 8.1* 8.2* 7.7* 8.0* 8.1*  PHOS  --  7.8* 7.9*  --  7.0*    Liver Function Tests:  Recent Labs Lab 01/07/15 0744 01/07/15 1315 01/11/15 0552  ALBUMIN 3.1* 2.8* 3.1*   No results for input(s): LIPASE, AMYLASE in the last 168 hours. No results for input(s): AMMONIA in the last 168 hours.  CBC:  Recent Labs Lab 01/06/15 1008 01/10/15 1815 01/11/15 1008  WBC 6.9 5.5 7.6  HGB 9.0* 8.9* 8.1*  HCT 27.2* 26.9* 25.7*  MCV 89.8 90.7 92.2  PLT 432 344 313    Cardiac Enzymes: No results for input(s): CKTOTAL, CKMB,  CKMBINDEX, TROPONINI in the last 168 hours.  BNP: Invalid input(s): POCBNP  CBG:  Recent Labs Lab 01/11/15 1324 01/11/15 1616 01/11/15 2159 01/12/15 0721 01/12/15 1113  GLUCAP 92 103* 96 90 146*    Microbiology: Results for orders placed or performed during the hospital encounter of 12/28/14  Culture, blood (single)     Status: None (Preliminary result)   Collection Time: 12/27/14  8:34 PM  Result Value Ref Range Status   Micro Text Report   Preliminary       COMMENT                   NO GROWTH IN 36 HOURS   ANTIBIOTIC                                                      Culture, blood (single)     Status: None (Preliminary result)   Collection Time: 12/27/14  8:40 PM  Result Value Ref Range Status   Micro Text Report   Preliminary       COMMENT  NO GROWTH IN 36 HOURS   ANTIBIOTIC                                                        Coagulation Studies: No results for input(s): LABPROT, INR in the last 72 hours.  Urinalysis: No results for input(s): COLORURINE, LABSPEC, PHURINE, GLUCOSEU, HGBUR, BILIRUBINUR, KETONESUR, PROTEINUR, UROBILINOGEN, NITRITE, LEUKOCYTESUR in the last 72 hours.  Invalid input(s): APPERANCEUR    Imaging: No results found.   Medications:     . apixaban  2.5 mg Oral BID  . epoetin (EPOGEN/PROCRIT) injection  10,000 Units Subcutaneous Weekly  . feeding supplement (NEPRO CARB STEADY)  237 mL Oral BID BM  . hydrALAZINE  25 mg Oral 3 times per day  . insulin aspart  0-10 Units Subcutaneous TID AC & HS  . LORazepam  1 mg Oral QHS  . metoprolol tartrate  25 mg Oral BID  . ondansetron (ZOFRAN) IV  4 mg Intravenous 6 times per day  . polyethylene glycol  17 g Oral Daily   acetaminophen, ALPRAZolam, alum & mag hydroxide-simeth, docusate sodium, ondansetron (ZOFRAN) IV, ondansetron, simethicone, sodium chloride  Assessment/ Plan:  44 y.o. 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   1.  ESRD (ATN, no recovery):  Multiple contrast exposure this admission. Baseline creatinine of 0.99. 12/20/2014 -  Creatinine and BUN remain critically elevated. First hemodialysis on 5/3 -  dose all medications for Cr Cl < 10. Avoid nephrotoxic agents.  -  outpatient d/c planning- d/w patient pathway team- He is set for [facility] [address] [facility]-2 -  Permcath  Placed this admission. PD cathter deferred for later.  -  heparin ordered during dialysis treatment  2. Peripheral edema - remove fluid with HD as tolerated   3. Acute thrombosis I82.412: currently on apixaban  Has IVC filter in place  4. Anemia  - Hgb 8.1  - monitor - low dose procrit    LOS: 15 Ryan [name] 5/15/201611:39 AM

## 2015-01-12 NOTE — Plan of Care (Signed)
Problem: Discharge Progression Outcomes Goal: Discharge plan in place and appropriate Individualization  Outcome: Progressing Patient prefers to be called Ryan [name]. 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 History of HTN and metabolic syndrome controlled by home meds. Moderate fall risk - no bed alarm activated, hourly rounding performed Goal: Other Discharge Outcomes/Goals Outcome: Progressing Plan of Care Progressing to Goal:  Pt tolerating diet well, no complaints of nausea/vomitting. Telemetry monitored. O2 saturations in high 90's. No complaints of pain. Walked with physical therapy in room.

## 2015-01-12 NOTE — Plan of Care (Signed)
Problem: Discharge Progression Outcomes Goal: Barriers To Progression Addressed/Resolved Outcome: Progressing : Progressing Patient prefers to be called Leonette Mostharles. 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 History of HTN and metabolic syndrome controlled by home meds. Moderate fall risk - no bed alarm activated, hourly rounding performed    Goal: Other Discharge Outcomes/Goals Outcome: Progressing  Plan of Care Progress to Goal: Barriers: pt with sudden onset SOB tonight, sats in 70's when sleeping per continuous pulse ox, new order for cpap sats in upper 90's  Hemo stable: continue to monitor labs kidney function elevated, pt on dialysis  Complications: pt now on cpap to improve oxygenation at night  Discharge : plans unknown at this time.

## 2015-01-13 LAB — GLUCOSE, CAPILLARY
Glucose-Capillary: 114 mg/dL — ABNORMAL HIGH (ref 65–99)
Glucose-Capillary: 122 mg/dL — ABNORMAL HIGH (ref 65–99)
Glucose-Capillary: 98 mg/dL (ref 65–99)
Glucose-Capillary: 98 mg/dL (ref 65–99)

## 2015-01-13 MED ORDER — SODIUM CHLORIDE 0.9 % IV BOLUS (SEPSIS)
250.0000 mL | Freq: Once | INTRAVENOUS | Status: AC
  Administered 2015-01-13: 250 mL via INTRAVENOUS

## 2015-01-13 MED ORDER — HYDRALAZINE HCL 25 MG PO TABS: 25.0000 mg | ORAL_TABLET | Freq: Two times a day (BID) | ORAL | Status: DC

## 2015-01-13 NOTE — Plan of Care (Signed)
Problem: Discharge Progression Outcomes Goal: Barriers To Progression Addressed/Resolved Outcome: Progressing  Individualization    Patient prefers to be called Ryan [name].  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 History of HTN and metabolic syndrome controlled by home meds. Moderate fall risk - no bed alarm activated, hourly rounding performed    Goal: Other Discharge Outcomes/Goals Outcome: Progressing  Plan of Care Progressing to Goal:  Pt tolerating diet well, no complaints of nausea/vomitting. Telemetry monitored. O2 saturations in high 90's CPAP at night for ? apena . No complaints of pain.

## 2015-01-13 NOTE — Progress Notes (Signed)
PT Cancellation Note  Patient Details Name: Ryan [name] MRN: [id number] DOB: 09-25-1970   Cancelled Treatment:    Reason Eval/Treat Not Completed: Patient declined, no reason specified;Fatigue/lethargy limiting ability to participate. Pt notes he was up in the chair this a.m for several hours and walked to the bathroom 2x. Pt states he is just too "washed out" at this time. Pt encouraged to perform bed exercises later this afternoon. Attempt treatment tomorrow. Pt now has a dialysis schedule of Tuesday, Thursday and Saturday.    Elsie StainHeidi Elizabeth Bishop 01/13/2015, 3:11 PM

## 2015-01-13 NOTE — Clinical Social Work Placement (Signed)
   CLINICAL SOCIAL WORK PLACEMENT  NOTE  Date:  01/13/2015  Patient Details  Name: Ryan [name] MRN: [id number] Date of Birth: January 30, 1971  Clinical Social Work is seeking post-discharge placement for this patient at the Skilled  Nursing Facility level of care (*CSW will initial, date and re-position this form in  chart as items are completed):  Yes   Patient/family provided with [place] Clinical Social Work Department's list of facilities offering this level of care within the geographic area requested by the patient (or if unable, by the patient's family).  Yes   Patient/family informed of their freedom to choose among providers that offer the needed level of care, that participate in Medicare, Medicaid or managed care program needed by the patient, have an available bed and are willing to accept the patient.  Yes   Patient/family informed of [facility] [place]'s ownership interest in [facility] and [facility], as well as of the fact that they are under no obligation to receive care at these facilities.  PASRR submitted to EDS on       PASRR number received on       Existing PASRR number confirmed on       FL2 transmitted to all facilities in geographic area requested by pt/family on 01/13/15     FL2 transmitted to all facilities within larger geographic area on       Patient informed that his/her managed care company has contracts with or will negotiate with certain facilities, including the following:            Patient/family informed of bed offers received.  Patient chooses bed at       Physician recommends and patient chooses bed at      Patient to be transferred to   on  .  Patient to be transferred to facility by       Patient family notified on   of transfer.  Name of family member notified:        PHYSICIAN       Additional Comment:    _______________________________________________ Dede QuerySarah Tarin Johndrow, LCSW 01/13/2015, 5:29 PM

## 2015-01-13 NOTE — Progress Notes (Signed)
[facility] [place] [facility]   PATIENT NAME: Ryan GaussCharles [name]    MR#:  [id number]  DATE OF BIRTH:  May 05, 1971  SUBJECTIVE:seen today,c/o light headed ness.no nausea.  CHIEF COMPLAINT:  No chief complaint on file.   no complaint except weakness.  REVIEW OF SYSTEMS:   CONSTITUTIONAL: Obese patient, No fever,  + fatigue + weakness.  EYES: No blurred or double vision.  EARS, NOSE, AND THROAT: No tinnitus or ear pain.  RESPIRATORY: No cough, shortness of breath, wheezing or hemoptysis.  CARDIOVASCULAR: No chest pain, orthopnea, edema.  GASTROINTESTINAL: No nausea, vomiting, diarrhea or abdominal pain.  GENITOURINARY: No dysuria, hematuria.  ENDOCRINE: No polyuria, nocturia,  HEMATOLOGY: No anemia, easy bruising or bleeding SKIN: No rash or lesion. MUSCULOSKELETAL: No joint pain or arthritis.   NEUROLOGIC: No tingling, numbness, weakness.  PSYCHIATRY: No anxiety or depression.   DRUG ALLERGIES:   Allergies  Allergen Reactions  . Ivp Dye [Iodinated Diagnostic Agents]     Kidney failure  . Clindamycin/Lincomycin Rash    VITALS:  Blood pressure 151/83, pulse 76, temperature 98.6 F (37 C), temperature source Oral, resp. rate 18, height 5' 6.97" (1.701 m), weight 118.9 kg (262 lb 2 oz), SpO2 99 %.  PHYSICAL EXAMINATION:   GENERAL:  44 y.o.-year-old obese patient lying in the bed with no acute distress.  EYES: Pupils equal, round, reactive to light and accommodation. No scleral icterus. Extraocular muscles intact.  HEENT: Head atraumatic, normocephalic. Oropharynx and nasopharynx clear.  NECK:  Supple, no jugular venous distention. No thyroid enlargement, no tenderness.  LUNGS: Normal breath sounds bilaterally, no wheezing, rales,rhonchi or crepitation. No use of accessory muscles of respiration.  CARDIOVASCULAR: S1, S2 normal. No murmurs, rubs, or gallops.  ABDOMEN: Soft, nontender, nondistended. Bowel sounds present. No organomegaly or mass.   EXTREMITIES: 2+ pedal edema with chronic skin changes, cyanosis, or clubbing.  NEUROLOGIC: Cranial nerves II through XII are intact. Muscle strength 5/5 in all extremities. Sensation intact. Gait not checked.  PSYCHIATRIC: The patient is alert and oriented x 3.  SKIN: No obvious rash, lesion, or ulcer.    LABORATORY PANEL:   CBC  Recent Labs Lab 01/11/15 1008  WBC 7.6  HGB 8.1*  HCT 25.7*  PLT 313   ------------------------------------------------------------------------------------------------------------------  Chemistries   Recent Labs Lab 01/11/15 0552  NA 142  K 4.7  CL 100*  CO2 30  GLUCOSE 111*  BUN 63*  CREATININE 14.74*  CALCIUM 8.1*   ------------------------------------------------------------------------------------------------------------------  Cardiac Enzymes No results for input(s): TROPONINI in the last 168 hours. ------------------------------------------------------------------------------------------------------------------  RADIOLOGY:  No results found.  EKG:  No orders found for this or any previous visit.  ASSESSMENT AND PLAN:   44 y.o. 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 for with right lower extremity DVT.   1. Acute renal failure, progressed to ESRD: . Multiple contrast exposure this admission. Baseline creatinine of 0.99. -  Dialyzes  mulitple times during this hospitalization. Had a permacath  He is being maintained on temporary catheter for dialysis for 1 week. Today he is getting hemodialysis through his permacath.hd on Tue,THu,SAT 2. Acute thrombosis I82.412: off heparin gtt. Appreciate vascular input. S/p IVC filter. on apixaban - renally dosed. Restarted  eliquis. 3. Hypertension: well controlled. On metoprolol. Started on hydralazine.he feels light headed ,may be  due to  bp being slightly soft side.[name].adjust  hydrallazine 4. Hypercholesterolemia: not currently on a statin   5. Diabetes mellitus type II: on metformin as outpatient. Currently on insulin. Glucose well controlled.   6. GERD- protonix BID  7. Axiety d/o on xanax bid prn   SNF placement  All the records are reviewed and case discussed with Care Management/Social Workerr. Management plans discussed with the patient, family and they are in agreement.  CODE STATUS: FULL CODE  TOTAL TIME TAKING CARE OF THIS PATIENT: 38 min minutes.     Ryan HammingKONIDENA,Ryan [name].D on 01/13/2015 at 12:37 PM  Between 7am to 6pm - Pager - [phone]  After 6pm go to [email or web address] - password [email or web address] [facility]  [facility] [place] [facility]  Office  [phone]  CC: Primary care physician; No primary care provider on file.

## 2015-01-13 NOTE — Clinical Social Work Note (Signed)
Clinical Social Work Assessment  Patient Details  Name: Ryan [name] MRN: [id number] Date of Birth: 1971/07/13  Date of referral:  01/13/15               Reason for consult:  Facility Placement                Permission sought to share information with:  Chartered certified accountant granted to share information::  Yes, Verbal Permission Granted  Name::        Agency::     Relationship::     Contact Information:     Housing/Transportation Living arrangements for the past 2 months:  Single Family Home Source of Information:  Patient Patient Interpreter Needed:  None Criminal Activity/Legal Involvement Pertinent to Current Situation/Hospitalization:  No - Comment as needed Significant Relationships:  Spouse Lives with:  Spouse Do you feel safe going back to the place where you live?  Yes Need for family participation in patient care:  No (Coment)  Care giving concerns:  PT recommends SNF.    Social Worker assessment / plan:  CSW met with pt to address consult for SNF. CSW introduced herself and explained role of social work. CSW also explained the process of discharging to SNF under [facility].   PT is recommending SNF, which pt is agreeable. Pt is also a new HD pt. Pt will go to [facility] [address] [facility] at 11:30. Pt has already started working with [employer] for transportation needs.   CSW will complete FL2 and initiate bed search. CSW will follow up with bed offers. CSW will continue to follow.   Employment status:  Therapist, music:  Managed Care PT Recommendations:  Rule / Referral to community resources:  [place]  Patient/Family's Response to care:  Pt is appreciative of CSW support.   Patient/Family's Understanding of and Emotional Response to Diagnosis, Current Treatment, and Prognosis:  Pt is aware that he will benefit from STR at SNF prior to returning home.   Emotional Assessment Appearance:   Appears stated age Attitude/Demeanor/Rapport:  Other (Agreeable) Affect (typically observed):  Accepting Orientation:  Oriented to Self, Oriented to Place, Oriented to  Time, Oriented to Situation Alcohol / Substance use:  Never Used Psych involvement (Current and /or in the community):  No (Comment)  Discharge Needs  Concerns to be addressed:  Financial / Insurance Concerns Readmission within the last 30 days:  No Current discharge risk:  Chronically ill Barriers to Discharge:  No Barriers Identified   Darden Dates, LCSW 01/13/2015, 2:06 PM

## 2015-01-13 NOTE — Progress Notes (Signed)
Subjective:  Last dialysis on Saturday. Sitting up in chair this AM.  Slowly feling better. UOP remains in oliguric range.   Objective:  Vital signs in last 24 hours:  Temp:  [98 F (36.7 C)-99.4 F (37.4 C)] 98.6 F (37 C) (05/16 0459) Pulse Rate:  [76-90] 76 (05/16 0812) Resp:  [18] 18 (05/16 0459) BP: (144-156)/(81-91) 151/83 mmHg (05/16 0812) SpO2:  [91 %-99 %] 99 % (05/16 0459) FiO2 (%):  [35 %] 35 % (05/16 0030)  Weight change:  Filed Weights   01/09/15 1000 01/09/15 1340 01/11/15 1200  Weight: 123.8 kg (272 lb 14.9 oz) 119.4 kg (263 lb 3.7 oz) 118.9 kg (262 lb 2 oz)    Intake/Output: I/O last 3 completed shifts: In: 360 [P.O.:360] Out: 400 [Urine:400]   Intake/Output this shift:     General: NAD Head: [place]/AT,  moist oral mucosal membranes Neck: supple, trachea midline CVS: regular rate and rhythm Resp: CTAB normal effort ABD: soft, nontender, +obese EXT: 2+ bilateral LE edema Neuro: awake, alert, follows commands Access: RIJ PERMcath Dr. Gilda CreaseSchnier 5/13   Basic Metabolic Panel:  Recent Labs Lab 01/06/15 1008 01/07/15 0744 01/07/15 1315 01/09/15 0441 01/11/15 0552  NA 137 138 136 141 142  K 4.5 4.6 4.4 4.5 4.7  CL 97* 97* 97* 100* 100*  CO2 26 27 27 29 30   GLUCOSE 121* 105* 122* 106* 111*  BUN 93* 73* 75* 67* 63*  CREATININE 16.90* 14.85* 15.42* 14.57* 14.74*  CALCIUM 8.1* 8.2* 7.7* 8.0* 8.1*  PHOS  --  7.8* 7.9*  --  7.0*    Liver Function Tests:  Recent Labs Lab 01/07/15 0744 01/07/15 1315 01/11/15 0552  ALBUMIN 3.1* 2.8* 3.1*   No results for input(s): LIPASE, AMYLASE in the last 168 hours. No results for input(s): AMMONIA in the last 168 hours.  CBC:  Recent Labs Lab 01/06/15 1008 01/10/15 1815 01/11/15 1008  WBC 6.9 5.5 7.6  HGB 9.0* 8.9* 8.1*  HCT 27.2* 26.9* 25.7*  MCV 89.8 90.7 92.2  PLT 432 344 313    Cardiac Enzymes: No results for input(s): CKTOTAL, CKMB, CKMBINDEX, TROPONINI in the last 168  hours.  BNP: Invalid input(s): POCBNP  CBG:  Recent Labs Lab 01/12/15 0721 01/12/15 1113 01/12/15 1640 01/12/15 2221 01/13/15 0714  GLUCAP 90 146* 125* 115* 114*    Microbiology: Results for orders placed or performed during the hospital encounter of 12/28/14  Culture, blood (single)     Status: None (Preliminary result)   Collection Time: 12/27/14  8:34 PM  Result Value Ref Range Status   Micro Text Report   Preliminary       COMMENT                   NO GROWTH IN 36 HOURS   ANTIBIOTIC                                                      Culture, blood (single)     Status: None (Preliminary result)   Collection Time: 12/27/14  8:40 PM  Result Value Ref Range Status   Micro Text Report   Preliminary       COMMENT                   NO GROWTH IN 36 HOURS  ANTIBIOTIC                                                        Coagulation Studies: No results for input(s): LABPROT, INR in the last 72 hours.  Urinalysis: No results for input(s): COLORURINE, LABSPEC, PHURINE, GLUCOSEU, HGBUR, BILIRUBINUR, KETONESUR, PROTEINUR, UROBILINOGEN, NITRITE, LEUKOCYTESUR in the last 72 hours.  Invalid input(s): APPERANCEUR    Imaging: No results found.   Medications:     . apixaban  2.5 mg Oral BID  . epoetin (EPOGEN/PROCRIT) injection  10,000 Units Subcutaneous Weekly  . feeding supplement (NEPRO CARB STEADY)  237 mL Oral BID BM  . hydrALAZINE  25 mg Oral 3 times per day  . insulin aspart  0-10 Units Subcutaneous TID AC & HS  . LORazepam  1 mg Oral QHS  . metoprolol tartrate  25 mg Oral BID  . ondansetron (ZOFRAN) IV  4 mg Intravenous 6 times per day  . polyethylene glycol  17 g Oral Daily   acetaminophen, ALPRAZolam, alum & mag hydroxide-simeth, docusate sodium, ondansetron (ZOFRAN) IV, ondansetron, simethicone, sodium chloride  Assessment/ Plan:  44 y.o. 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   1.  ESRD (ATN, no recovery):  Multiple contrast exposure this admission. Baseline creatinine of 0.99. 12/20/2014 -  Will plan for HD again tomorrow, UOP remains quite low at present.  2. Peripheral edema - UF target of 2kg tomorrow.   3. Acute thrombosis I82.412: currently on apixaban 2.5mg  po BID. Has IVC filter in place  4. Anemia of CKD. - last hgb 8.1, continue low dose epogen with HD.      LOS: 16 Ryan [name] 5/16/20169:53 AM

## 2015-01-14 LAB — GLUCOSE, CAPILLARY
Glucose-Capillary: 99 mg/dL (ref 65–99)
Glucose-Capillary: 90 mg/dL (ref 65–99)
Glucose-Capillary: 103 mg/dL — ABNORMAL HIGH (ref 65–99)
Glucose-Capillary: 110 mg/dL — ABNORMAL HIGH (ref 65–99)

## 2015-01-14 NOTE — Plan of Care (Signed)
Problem: Discharge Progression Outcomes Goal: Discharge plan in place and appropriate Individualization  Outcome: Progressing Patient prefers to be called Ryan [name].  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 History of HTN and metabolic syndrome controlled by home meds. Moderate fall risk - no bed alarm activated, hourly rounding performed Goal: Other Discharge Outcomes/Goals Outcome: Progressing Plan of care progress to goal:  Pt had dialysis today, pulled off 2L. C/o headache, tylenol given. zofran IV q4h, no c/o nausea. Tolerating diet. Refused PT after dialysis.  Pt resting quietly. Planning for d/c to SNF.

## 2015-01-14 NOTE — Progress Notes (Signed)
[facility] [place] [facility]   PATIENT NAME: Ryan GaussCharles [name]    MR#:  [id number]  DATE OF BIRTH:  [birth year]  SUBJECTIVE: lightheadedness improved. Nausea. Hydralazine was stopped because of lightheadedness dizziness and low blood pressure.  CHIEF COMPLAINT:  No chief complaint on file.   no complaint except weakness.  REVIEW OF SYSTEMS:   CONSTITUTIONAL: Obese patient, No fever,  + fatigue + weakness.  EYES: No blurred or double vision.  EARS, NOSE, AND THROAT: No tinnitus or ear pain.  RESPIRATORY: No cough, shortness of breath, wheezing or hemoptysis.  CARDIOVASCULAR: No chest pain, orthopnea, edema.  GASTROINTESTINAL: No nausea, vomiting, diarrhea or abdominal pain.  GENITOURINARY: No dysuria, hematuria.  ENDOCRINE: No polyuria, nocturia,  HEMATOLOGY: No anemia, easy bruising or bleeding SKIN: No rash or lesion. MUSCULOSKELETAL: No joint pain or arthritis.   NEUROLOGIC: No tingling, numbness, weakness.  PSYCHIATRY: No anxiety or depression.   DRUG ALLERGIES:   Allergies  Allergen Reactions  . Ivp Dye [Iodinated Diagnostic Agents]     Kidney failure  . Clindamycin/Lincomycin Rash    VITALS:  Blood pressure 161/93, pulse 68, temperature 99.1 F (37.3 C), temperature source Oral, resp. rate 18, height 5' 6.97" (1.701 m), weight 118.9 kg (262 lb 2 oz), SpO2 95 %.  PHYSICAL EXAMINATION:   GENERAL:  44 y.o.-year-old obese patient lying in the bed with no acute distress.  EYES: Pupils equal, round, reactive to light and accommodation. No scleral icterus. Extraocular muscles intact.  HEENT: Head atraumatic, normocephalic. Oropharynx and nasopharynx clear.  NECK:  Supple, no jugular venous distention. No thyroid enlargement, no tenderness.  LUNGS: Normal breath sounds bilaterally, no wheezing, rales,rhonchi or crepitation. No use of accessory muscles of respiration.  CARDIOVASCULAR: S1, S2 normal. No murmurs, rubs, or gallops.  ABDOMEN: Soft,  nontender, nondistended. Bowel sounds present. No organomegaly or mass.  EXTREMITIES: 2+ pedal edema with chronic skin changes, cyanosis, or clubbing.  NEUROLOGIC: Cranial nerves II through XII are intact. Muscle strength 5/5 in all extremities. Sensation intact. Gait not checked.  PSYCHIATRIC: The patient is alert and oriented x 3.  SKIN: No obvious rash, lesion, or ulcer.    LABORATORY PANEL:   CBC  Recent Labs Lab 01/11/15 1008  WBC 7.6  HGB 8.1*  HCT 25.7*  PLT 313   ------------------------------------------------------------------------------------------------------------------  Chemistries   Recent Labs Lab 01/11/15 0552  NA 142  K 4.7  CL 100*  CO2 30  GLUCOSE 111*  BUN 63*  CREATININE 14.74*  CALCIUM 8.1*   ------------------------------------------------------------------------------------------------------------------  Cardiac Enzymes No results for input(s): TROPONINI in the last 168 hours. ------------------------------------------------------------------------------------------------------------------  RADIOLOGY:  No results found.  EKG:  No orders found for this or any previous visit.  ASSESSMENT AND PLAN:   44 y.o. 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   1. Acute renal failure, progressed to ESRD: . Multiple contrast exposure this admission. Baseline creatinine of 0.99.  [facility].for HD today, -  2. Acute thrombosis I82.412: continue Eliquis 3. Hypertension: well controlled. On metoprolol. Started on hydralazine but due to dizziness,low normal bp we stopped hydralazine.he feels better today. 4. Hypercholesterolemia: not currently on a statin   5. Diabetes mellitus type II: on metformin as outpatient.  Currently on insulin. Glucose well controlled.   6. GERD- protonix BID  7.  Axiety d/o on xanax bid prn   SNF placement, possible discharge tomorrow.  All the records are reviewed and case discussed with Care Management/Social Workerr. Management plans discussed with the patient, family and they are in agreement.  CODE STATUS: FULL CODE  TOTAL TIME TAKING CARE OF THIS PATIENT: 38 min minutes.     Katha HammingKONIDENA,Faithanne Verret M.D on 01/14/2015 at 11:30 AM  Between 7am to 6pm - Pager - [phone]  After 6pm go to [email or web address] - password [email or web address] [facility]  [facility]  Office  [phone]  CC: Primary care physician; No primary care provider on file.

## 2015-01-14 NOTE — Plan of Care (Signed)
Problem: Discharge Progression Outcomes Goal: Discharge plan in place and appropriate Individualization  Patient prefers to be called Ryan [name].  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 History of HTN and metabolic syndrome controlled by home meds. Moderate fall risk - no bed alarm activated, hourly rounding performed Goal: Other Discharge Outcomes/Goals Outcome: Progressing Progress to goal: Hemodynamically stable- NSR hr 70's SPO2 93% room air Activity- Pt did not get oob this shift, pt is mobile in bed with no complaints.

## 2015-01-14 NOTE — Progress Notes (Signed)
PRE HD   

## 2015-01-14 NOTE — Progress Notes (Signed)
PT Cancellation Note  Patient Details Name: Ryan [name] MRN: [id number] DOB: 08/16/1971   Cancelled Treatment:    Reason Eval/Treat Not Completed: Fatigue/lethargy limiting ability to participate. Pt just returned from dialysis and is unable to participate in PT at this time. Attempt treatment tomorrow.  Elsie StainHeidi Elizabeth Bishop 01/14/2015, 2:28 PM

## 2015-01-14 NOTE — Progress Notes (Signed)
PT Cancellation Note  Patient Details Name: Ryan SilversmithCharles A [name] MRN: [id number] DOB: 29-Mar-1971   Cancelled Treatment:    Reason Eval/Treat Not Completed: Patient at procedure or test/unavailable. Attempted treatment before dialysis; pt being escorted to dialysis. Will re attempt treatment later this afternoon post dialysis   Kristeen MissHeidi Elizabeth Bishop 01/14/2015, 10:00 AM

## 2015-01-14 NOTE — Progress Notes (Signed)
[facility]  ROUNDING NOTE   Subjective:  Pt seen during HD, tolerating well.    Objective:  Vital signs in last 24 hours:  Temp:  [98.8 F (37.1 C)-99.5 F (37.5 C)] 99.1 F (37.3 C) (05/17 1015) Pulse Rate:  [68-80] 69 (05/17 1130) Resp:  [14-21] 14 (05/17 1130) BP: (136-173)/(73-96) 173/96 mmHg (05/17 1130) SpO2:  [91 %-96 %] 95 % (05/17 1015)  Weight change:  Filed Weights   01/09/15 1000 01/09/15 1340 01/11/15 1200  Weight: 123.8 kg (272 lb 14.9 oz) 119.4 kg (263 lb 3.7 oz) 118.9 kg (262 lb 2 oz)    Intake/Output: I/O last 3 completed shifts: In: 480 [P.O.:480] Out: 550 [Urine:250; Emesis/NG output:300]   Intake/Output this shift:  Total I/O In: 240 [P.O.:240] Out: 350 [Urine:350]  Physical Exam: General: NAD  Head: Normocephalic, atraumatic. Moist oral mucosal membranes  Eyes: Anicteric  Neck: Supple, trachea midline  Lungs:  Clear to auscultation normal effort  Heart: Regular rate and rhythm  Abdomen:  Soft, nontender, BS present  Extremities:  2+ peripheral edema.  Neurologic: Nonfocal, moving all four extremities  Skin: No lesions  Access: IJ permcath    Basic Metabolic Panel:  Recent Labs Lab 01/07/15 1315 01/09/15 0441 01/11/15 0552  NA 136 141 142  K 4.4 4.5 4.7  CL 97* 100* 100*  CO2 27 29 30   GLUCOSE 122* 106* 111*  BUN 75* 67* 63*  CREATININE 15.42* 14.57* 14.74*  CALCIUM 7.7* 8.0* 8.1*  PHOS 7.9*  --  7.0*    Liver Function Tests:  Recent Labs Lab 01/07/15 1315 01/11/15 0552  ALBUMIN 2.8* 3.1*   No results for input(s): LIPASE, AMYLASE in the last 168 hours. No results for input(s): AMMONIA in the last 168 hours.  CBC:  Recent Labs Lab 01/10/15 1815 01/11/15 1008  WBC 5.5 7.6  HGB 8.9* 8.1*  HCT 26.9* 25.7*  MCV 90.7 92.2  PLT 344 313    Cardiac Enzymes: No results for input(s): CKTOTAL, CKMB, CKMBINDEX, TROPONINI in the last 168 hours.  BNP: Invalid input(s): POCBNP  CBG:  Recent Labs Lab  01/13/15 0714 01/13/15 1134 01/13/15 1619 01/13/15 2229 01/14/15 0716  GLUCAP 114* 122* 98 98 99    Microbiology: Results for orders placed or performed during the hospital encounter of 12/28/14  Culture, blood (single)     Status: None (Preliminary result)   Collection Time: 12/27/14  8:34 PM  Result Value Ref Range Status   Micro Text Report   Preliminary       COMMENT                   NO GROWTH IN 36 HOURS   ANTIBIOTIC                                                      Culture, blood (single)     Status: None (Preliminary result)   Collection Time: 12/27/14  8:40 PM  Result Value Ref Range Status   Micro Text Report   Preliminary       COMMENT                   NO GROWTH IN 36 HOURS   ANTIBIOTIC  Coagulation Studies: No results for input(s): LABPROT, INR in the last 72 hours.  Urinalysis: No results for input(s): COLORURINE, LABSPEC, PHURINE, GLUCOSEU, HGBUR, BILIRUBINUR, KETONESUR, PROTEINUR, UROBILINOGEN, NITRITE, LEUKOCYTESUR in the last 72 hours.  Invalid input(s): APPERANCEUR    Imaging: No results found.   Medications:     . apixaban  2.5 mg Oral BID  . epoetin (EPOGEN/PROCRIT) injection  10,000 Units Subcutaneous Weekly  . feeding supplement (NEPRO CARB STEADY)  237 mL Oral BID BM  . insulin aspart  0-10 Units Subcutaneous TID AC & HS  . LORazepam  1 mg Oral QHS  . metoprolol tartrate  25 mg Oral BID  . ondansetron (ZOFRAN) IV  4 mg Intravenous 6 times per day  . polyethylene glycol  17 g Oral Daily   acetaminophen, ALPRAZolam, alum & mag hydroxide-simeth, docusate sodium, ondansetron (ZOFRAN) IV, ondansetron, simethicone, sodium chloride  Assessment/ Plan:  44 y.o. 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  1. ESRD (ATN, no recovery):  Multiple contrast exposure this admission. Baseline creatinine of 0.99. 12/20/2014 - Seen during HD, tolerating well, complete HD today, next HD on Thursday if still here.  2. Peripheral edema - UF target 2kg today.  3. Acute thrombosis I82.412: currently on apixaban 2.5mg  po BID. Has IVC filter in place  4. Anemia of CKD. - continue epogen 10000 units Pearson weekly, hgb 8.1     LOS: 17 Ryan [name] 5/17/201611:59 AM

## 2015-01-15 LAB — GLUCOSE, CAPILLARY
Glucose-Capillary: 95 mg/dL (ref 65–99)
Glucose-Capillary: 128 mg/dL — ABNORMAL HIGH (ref 65–99)
Glucose-Capillary: 111 mg/dL — ABNORMAL HIGH (ref 65–99)

## 2015-01-15 LAB — HEMOGLOBIN: Hemoglobin: 8 g/dL — ABNORMAL LOW (ref 13.0–18.0)

## 2015-01-15 MED ORDER — ALPRAZOLAM 0.25 MG PO TABS: 0.2500 mg | ORAL_TABLET | Freq: Three times a day (TID) | ORAL | Status: DC | PRN

## 2015-01-15 MED ORDER — CALCIUM ACETATE (PHOS BINDER) 667 MG PO CAPS: 1334.0000 mg | ORAL_CAPSULE | Freq: Three times a day (TID) | ORAL | Status: DC

## 2015-01-15 MED ORDER — APIXABAN 2.5 MG PO TABS: 2.5000 mg | ORAL_TABLET | Freq: Two times a day (BID) | ORAL | Status: DC

## 2015-01-15 MED ORDER — DOCUSATE SODIUM 100 MG PO CAPS: 100.0000 mg | ORAL_CAPSULE | Freq: Two times a day (BID) | ORAL | Status: DC | PRN

## 2015-01-15 MED ORDER — INSULIN ASPART 100 UNIT/ML ~~LOC~~ SOLN
0.0000 [IU] | Freq: Three times a day (TID) | SUBCUTANEOUS | Status: DC
Start: 2015-01-15 — End: 2016-11-22

## 2015-01-15 MED ORDER — CALCIUM ACETATE (PHOS BINDER) 667 MG PO CAPS
1334.0000 mg | ORAL_CAPSULE | Freq: Three times a day (TID) | ORAL | Status: DC
  Administered 2015-01-15: 14:00:00 1334 mg via ORAL
  Filled 2015-01-15: qty 2

## 2015-01-15 NOTE — Discharge Summary (Signed)
Ryan [name], is a 44 y.o. male  DOB Nov 01, 1970  MRN [id number].  Admission date:  12/28/2014  Admitting Physician  Ryan Natal, MD  Discharge Date:  01/15/2015   Primary MD  No primary care provider on file.  Recommendations for primary care physician for things to follow:  Dr.Sandeep Sherrlyn [name] F/u nephrology   Admission Diagnosis  ACUTE DEEP VEIN THROMBOSIS DVT  DVT   Discharge Diagnosis  ACUTE DEEP VEIN THROMBOSIS DVT  DVT    Active Problems:   Acute DVT (deep venous thrombosis)      No past medical history on file.  No past surgical history on file.     History of present illness and  Hospital Course:     Kindly see H&P for history of present illness and admission details, please review complete Labs, Consult reports and Test reports for all details in brief  HPI  from the history and physical done on the day of admission    Hospital Course This is a 44 year old African American male who presented to the Emergency Department via EMS complaining of pain in his right lower extremity. The patient has been having difficulty walking as well as experiencing some dizziness and lightheadedness. He was seen in the Emergency Department a few days ago for the same and admitted to the hospital. Found to have right leg DVT,started on heparin, for 48hrs.swtiched over to [place].seen by vascular for possible thrombectomy,pt  Had thombolysis with TPA on 4/29 .seen by oncology  2.ESRD;on HD 3.htn;controlled  Discharge Condition: stable   Follow UP  Follow-up Information    Follow up with [name], [name], MD In 2 days.   Specialty:  Internal Medicine   Contact information:   [address] [place] [address] [phone]         Discharge Instructions  and  Discharge Medications         Medication List    STOP taking these medications        hydrochlorothiazide 12.5 MG tablet  Commonly known as:  HYDRODIURIL     metFORMIN 500 MG tablet  Commonly known as:  GLUCOPHAGE      TAKE these medications        ALPRAZolam 0.25 MG tablet  Commonly known as:  XANAX  Take 1 tablet (0.25 mg total) by mouth every 8 (eight) hours as needed for anxiety.     apixaban 2.5 MG Tabs tablet  Commonly known as:  ELIQUIS  Take 1 tablet (2.5 mg total) by mouth 2 (two)  times daily.     calcium acetate 667 MG capsule  Commonly known as:  PHOSLO  Take 2 capsules (1,334 mg total) by mouth 3 (three) times daily with meals.     docusate sodium 100 MG capsule  Commonly known as:  COLACE  Take 100 mg by mouth 2 (two) times daily as needed for mild constipation.     docusate sodium 100 MG capsule  Commonly known as:  COLACE  Take 1 capsule (100 mg total) by mouth 2 (two) times daily as needed (for stool softener).     insulin aspart 100 UNIT/ML injection  Commonly known as:  novoLOG  Inject 0-10 Units into the skin 4 (four) times daily -  before meals and at bedtime.     metoprolol tartrate 25 MG tablet  Commonly known as:  LOPRESSOR  Take 25 mg by mouth 2 (two) times daily.     simethicone 80 MG chewable tablet  Commonly known as:  MYLICON  Chew 80 mg by mouth 4 (four) times daily as needed for flatulence.     traMADol 50 MG tablet  Commonly known as:  ULTRAM  Take by mouth 3 (three) times daily as needed for moderate pain.          Diet and Activity recommendation: See Discharge Instructions above   Consults obtained - nephrology Vascular    Major procedures and Radiology Reports - PLEASE review detailed and final reports for all details, in brief -      Ct Abdomen Pelvis Wo Contrast  12/20/2014   CLINICAL DATA:  Right-sided abdominal pain.  EXAM: CT ABDOMEN AND PELVIS WITHOUT CONTRAST  TECHNIQUE: Multidetector CT imaging of the abdomen and pelvis was performed  following the standard protocol without IV contrast.  COMPARISON:  None.  FINDINGS: Unenhanced CT shows no evidence of renal obstruction or calculi. Ureters and bladder are unremarkable.  The liver shows fatty infiltration. The gallbladder, pancreas, adrenal glands and spleen are unremarkable. Bowel shows no evidence of obstruction or inflammation. No free air, free fluid or abscess is identified.  There is a umbilical hernia containing fat and focal area of calcification. No masses or enlarged lymph nodes are seen. IVC filter is present which is tilted to the left. There is some inferior filter leg penetration through the wall of the IVC without apparent complication. Bony structures are unremarkable.  IMPRESSION: 1. No acute findings. 2. Hepatic steatosis.   Electronically Signed   By: Irish Lack M.D.   On: 12/20/2014 09:32   Ct Angio Chest Pe W/cm &/or Wo Cm  12/20/2014   CLINICAL DATA:  Chest pressure. History of DVT with prior IVC filter placement in 2009.  EXAM: CT ANGIOGRAPHY CHEST WITH CONTRAST  TECHNIQUE: Multidetector CT imaging of the chest was performed using the standard protocol during bolus administration of intravenous contrast. Multiplanar CT image reconstructions and MIPs were obtained to evaluate the vascular anatomy.  CONTRAST:  100 mL Isovue 350 IV  COMPARISON:  Prior study on 01/04/2008 cannot be retrieved. The report is available which demonstrated pulmonary embolism in the right lower lobe pulmonary artery.  FINDINGS: The pulmonary arteries are well opacified. There is no evidence of acute or chronic pulmonary embolism. The thoracic aorta is also well opacified and shows normal caliber without evidence of dissection. Proximal great vessels show bovine branching anatomy and normal patency. The heart size is normal. No pleural or pericardial fluid is seen.  Lungs show no evidence of edema, consolidation, nodule or pneumothorax. Mild  pleural thickening due to subpleural fat is present  bilaterally. No enlarged lymph nodes are seen. No airway obstruction. The visualized upper abdomen is unremarkable.  Review of the MIP images confirms the above findings.  IMPRESSION: No evidence of pulmonary embolism or other acute findings in the chest.   Electronically Signed   By: Irish Lack M.D.   On: 12/20/2014 09:36   Ct Abdomen Pelvis W Contrast  12/24/2014   CLINICAL DATA:  Acute onset of right lower quadrant abdominal pain and right leg pain and swelling for 4 days. Initial encounter.  EXAM: CT ABDOMEN AND PELVIS WITH CONTRAST  TECHNIQUE: Multidetector CT imaging of the abdomen and pelvis was performed using the standard protocol following bolus administration of intravenous contrast.  CONTRAST:  125 mL of Omnipaque 300 IV contrast  COMPARISON:  CT of the abdomen and pelvis from 12/20/2014  FINDINGS: The visualized lung bases are clear.  There is complete occlusion of the visualized right common femoral vein and its more inferior branches, as well as the left common femoral vein, though the left profunda femoral vein and superficial femoral vein appear to demonstrate some flow. Diffuse soft tissue edema is noted surrounding the vasculature at the right thigh, with more superficial soft tissue edema also seen.  Complete thrombosis extends along the common iliac veins bilaterally, and along the inferior vena cava, to approximately 3.7 cm above the level of the IVC filter. This resolves just below the level of the renal veins. There is diffuse soft tissue inflammation along the distended thrombosed venous vasculature, compatible with acute thrombophlebitis.  The liver and spleen are unremarkable in appearance. The gallbladder is within normal limits. The pancreas and adrenal glands are unremarkable.  The kidneys are unremarkable in appearance. There is no evidence of hydronephrosis. No renal or ureteral stones are seen. No perinephric stranding is appreciated.  No free fluid is identified. The small  bowel is unremarkable in appearance. The stomach is within normal limits.  The appendix is normal in caliber, without evidence of appendicitis. Scattered diverticulosis is noted along the sigmoid colon, without evidence of diverticulitis.  The bladder is mildly distended. Mild soft tissue inflammation about the bladder could reflect cystitis. Would correlate clinically for associated symptoms. The prostate remains normal in size.  Enlarged bilateral inguinal nodes, measuring 2.5 cm in short axis on the right and 1.9 cm in short axis on the left, may reflect the thrombophlebitis, though biopsy could be considered as deemed clinically appropriate, as enlarged nodes were also seen on the recent prior study, when thrombophlebitis was less prominent.  No acute osseous abnormalities are identified.  IMPRESSION: 1. 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 Soft tissue edema extends more superficially. Occlusion of the left common femoral vein, though the left profunda femoral vein and superficial femoral vein appear to demonstrate some flow. 2. Complete thrombosis extends along the common iliac veins bilaterally, and along the inferior vena cava, to approximately 3.7 cm above the level of the IVC filter. This resolves just below the level of the renal veins. Diffuse associated soft tissue inflammation along the distended thrombosed veins, compatible with acute thrombophlebitis. 3. Mild soft tissue inflammation about the bladder could reflect cystitis. Would correlate clinically for associated symptoms. 4. Enlarged bilateral inguinal nodes, measuring up to 2.5 cm on the right, may reflect thrombophlebitis, though biopsy could be considered as deemed clinically appropriate, as these were also seen on the  recent prior study, when thrombophlebitis was less prominent. 5. Scattered diverticulosis along the sigmoid colon,  without evidence of diverticulitis.  These results were called by telephone at the time of interpretation on 12/24/2014 at 12:46 am to Dr. Darnelle CatalanMalinda, who verbally acknowledged these results.   Electronically Signed   By: Roanna RaiderJeffery  Chang M.D.   On: 12/24/2014 01:03   [place] Renal  12/30/2014   CLINICAL DATA:  Acute renal failure, history of DVT with IVC filter  EXAM: RENAL / URINARY TRACT ULTRASOUND COMPLETE  COMPARISON:  CT abdomen and pelvis 12/23/2014  FINDINGS: Right Kidney:  Length: 12.2 cm. Normal cortical thickness. Increased cortical echogenicity. No mass, hydronephrosis or shadowing calcification.  Left Kidney:  Length: 12.7 cm. Normal cortical thickness. Increased cortical echogenicity. No mass, hydronephrosis or shadowing calcification.  Bladder:  Decompressed, voided prior to exam, inadequately evaluated.  IMPRESSION: Increased renal cortical echogenicity bilaterally consistent with medical renal disease.  No sonographic evidence of renal mass or urinary tract obstruction.   Electronically Signed   By: Ulyses SouthwardMark  Boles M.D.   On: 12/30/2014 09:22   [place] Carotid Bilateral  12/20/2014   CLINICAL DATA:  Pre syncopal episode, hypertension, hyperlipidemia, diabetes  EXAM: BILATERAL CAROTID DUPLEX ULTRASOUND  TECHNIQUE: Wallace CullensGray scale imaging, color Doppler and duplex ultrasound were performed of bilateral carotid and vertebral arteries in the neck.  COMPARISON:  None.  FINDINGS: Criteria: Quantification of carotid stenosis is based on velocity parameters that correlate the residual internal carotid diameter with NASCET-based stenosis levels, using the diameter of the distal internal carotid lumen as the denominator for stenosis measurement.  The following velocity measurements were obtained:  RIGHT  ICA:  54/28 cm/sec  CCA:  88/14 cm/sec  SYSTOLIC ICA/CCA RATIO:  0.6  DIASTOLIC ICA/CCA RATIO:  2.0  ECA:  81 cm/sec  LEFT  ICA:  76/36 cm/sec  CCA:  81/17 cm/sec  SYSTOLIC ICA/CCA RATIO:  0.9  DIASTOLIC ICA/CCA RATIO:  2.1   ECA:  70 cm/sec  RIGHT CAROTID ARTERY: Minor echogenic shadowing plaque formation. No hemodynamically significant right ICA stenosis, velocity elevation, or turbulent flow. Degree of narrowing less than 50%.  RIGHT VERTEBRAL ARTERY:  Antegrade  LEFT CAROTID ARTERY: Similar scattered minor echogenic plaque formation. No hemodynamically significant left ICA stenosis, velocity elevation, or turbulent flow.  LEFT VERTEBRAL ARTERY:  Antegrade  IMPRESSION: Minor carotid atherosclerosis. No hemodynamically significant ICA stenosis. Degree of narrowing less than 50% bilaterally.   Electronically Signed   By: Judie PetitM.  Shick M.D.   On: 12/20/2014 16:38   Nm Pet Nopr Skull Base To Thigh  12/25/2014   CLINICAL DATA:  Initial treatment strategy for bilateral inguinal adenopathy. Weight loss. Recurrent deep venous thrombosis. Evaluate for lymphoma.  EXAM: NUCLEAR MEDICINE PET SKULL BASE TO THIGH  TECHNIQUE: 12.8 mCi F-18 FDG was injected intravenously. Full-ring PET imaging was performed from the skull base to thigh after the radiotracer. CT data was obtained and used for attenuation correction and anatomic localization.  FASTING BLOOD GLUCOSE:  Value: 106 mg/dl  COMPARISON:  Abdominal pelvic CT of 12/23/2014. Chest CT 12/20/2014.  FINDINGS: Moderate degradation, secondary to patient body habitus.  NECK  Given above limitations, no abnormal cervical nodal activity.  CHEST  No gross thoracic nodal hypermetabolism.  ABDOMEN/PELVIS  Right external iliac node measures 1.9 by 2.7 cm and a S.U.V. max of 9.0 on image 226.  Left external iliac node measures 9 mm and a S.U.V. max of 5.9 on image 226.  Bilateral mildly hypermetabolic inguinal adenopathy. A right-sided index  node measures 1.8 by 3.7 cm and a S.U.V. max of 3.5 on image 271.  More diffuse low-level hypermetabolism is identified about the proximal right thigh. This is presumably related to subacute thrombus.  SKELETON  No abnormal marrow activity.  CT IMAGES PERFORMED FOR  ATTENUATION CORRECTION  No cervical adenopathy. Chest abdomen and pelvic findings deferred to recent diagnostic CTs. Hepatic steatosis. Vicarious excretion of contrast by the gallbladder. IVC filter.  IMPRESSION: 1. Moderately degraded exam secondary to patient body habitus. 2. External iliac and inguinal hypermetabolic adenopathy. Although this could be reactive or neoplastic, the extent of hypermetabolism within the right external iliac node is suspicious for a lymphoproliferative process. Potential clinical strategies include sampling of inguinal or right external iliac nodes (if possible) versus CT followup after treatment of the acute thrombophlebitis (4-6 weeks). 3. No convincing evidence of extrapelvic hypermetabolic adenopathy.   Electronically Signed   By: Jeronimo Greaves M.D.   On: 12/25/2014 15:46   US Venous Img Lower Unilateral Right  12/24/2014   CLINICAL DATA:  Acute onset of right leg pain and swelling. Initial encounter.  EXAM: RIGHT LOWER EXTREMITY VENOUS DOPPLER ULTRASOUND  TECHNIQUE: Gray-scale sonography with graded compression, as well as color Doppler and duplex ultrasound were performed to evaluate the lower extremity deep venous systems from the level of the common femoral vein and including the common femoral, femoral, profunda femoral, popliteal and calf veins including the posterior tibial, peroneal and gastrocnemius veins when visible. The superficial great saphenous vein was also interrogated. Spectral Doppler was utilized to evaluate flow at rest and with distal augmentation maneuvers in the common femoral, femoral and popliteal veins.  COMPARISON:  None.  FINDINGS: Contralateral Common Femoral Vein: Occlusive thrombus is noted filling the contralateral common femoral vein.  Common Femoral Vein: Occlusive thrombus is noted filling the common femoral vein.  Saphenofemoral Junction: Occlusive thrombus is noted filling the saphenofemoral junction.  Profunda Femoral Vein: Occlusive  thrombus is noted within the profunda femoral vein.  Femoral Vein: Occlusive thrombus is noted within the femoral vein.  Popliteal Vein: Mostly occlusive thrombus is noted within the popliteal vein. A tiny amount of flow is noted within the popliteal vein, though the vein is noncompressible.  Calf Veins: No evidence of thrombus. The posterior tibial vein is visualized; remaining calf veins are not seen. Normal compressibility and flow on color Doppler imaging.  Superficial Great Saphenous Vein: Occlusive thrombus is noted within the great saphenous vein.  Venous Reflux:  None.  Other Findings:  None.  IMPRESSION: There is occlusive thrombus filling nearly all of the visualized veins of the right leg. There is also occlusive thrombus within the contralateral common femoral vein; the remainder of the left leg is not assessed on this study.  These results were called by telephone at the time of interpretation on 12/24/2014 at 12:38 am to Dr. Darnelle Catalan, who verbally acknowledged these results.   Electronically Signed   By: Roanna Raider M.D.   On: 12/24/2014 01:03   Dg Chest Port 1 View  12/31/2014   CLINICAL DATA:  44 year old male status post hemodialysis catheter placements  EXAM: PORTABLE CHEST - 1 VIEW  COMPARISON:  Prior chest x-ray 12/20/2014  FINDINGS: Interval placement of a left IJ approach non tunneled hemodialysis catheter. Catheter tip projects over the mid right atrium. Very low inspiratory volumes. Mild pulmonary vascular congestion without overt edema. No evidence of pneumothorax.  IMPRESSION: The tip of the left IJ approach non tunneled hemodialysis catheter projects over the mid right  atrium.   Electronically Signed   By: Malachy MoanHeath  McCullough M.D.   On: 12/31/2014 19:02   Dg Chest Port 1 View  12/20/2014   CLINICAL DATA:  Chest pain, chest tightness, shortness breath x1 day. History of pulmonary emboli.  EXAM: PORTABLE CHEST - 1 VIEW  COMPARISON:  08/24/2004 by report only  FINDINGS: Lungs are clear.  Heart size and mediastinal contours are within normal limits. No effusion. Visualized skeletal structures are unremarkable.  IMPRESSION: No acute cardiopulmonary disease.   Electronically Signed   By: Corlis Leak  Hassell M.D.   On: 12/20/2014 08:53   Dg Abd Portable 1v  12/31/2014   CLINICAL DATA:  Recent hemodialysis catheter placement  EXAM: PORTABLE ABDOMEN - 1 VIEW  COMPARISON:  Portable exam 1835 hr compared to CT exam of 12/23/2014  FINDINGS: No definite central line/dialysis catheter is identified.  IVC filter projects over lumbar spine.  Air-filled loops of air-filled nondilated large and small bowel.  Bones demineralized.  IMPRESSION: No definite dialysis catheter is visualized.   Electronically Signed   By: Ulyses SouthwardMark  Boles M.D.   On: 12/31/2014 19:00    Micro Results    No results found for this or any previous visit (from the past 240 hour(s)).     Today   Subjective:   Ryan [name] today has no headache,no chest abdominal pain,no new weakness tingling or numbness, feels much better wants to go home today.  Objective:   Blood pressure 158/98, pulse 75, temperature 98.5 F (36.9 C), temperature source Oral, resp. rate 20, height 5' 6.97" (1.701 m), weight 116.5 kg (256 lb 13.4 oz), SpO2 92 %.   Intake/Output Summary (Last 24 hours) at 01/15/15 0921 Last data filed at 01/15/15 0907  Gross per 24 hour  Intake    240 ml  Output   3500 ml  Net  -3260 ml    Exam Awake Alert, Oriented x 3, No new F.N deficits, Normal affect [place].AT,PERRAL Supple Neck,No JVD, No cervical lymphadenopathy appriciated.  Symmetrical Chest wall movement, Good air movement bilaterally, CTAB RRR,No Gallops,Rubs or new Murmurs, No Parasternal Heave +ve B.Sounds, Abd Soft, Non tender, No organomegaly appriciated, No rebound -guarding or rigidity. No Cyanosis, Clubbing or edema, No new Rash or bruise  Data Review   CBC w Diff: Lab Results  Component Value Date   WBC 7.6 01/11/2015   WBC 11.3* 12/28/2014    HGB 8.0* 01/15/2015   HGB 11.7* 12/28/2014   HCT 25.7* 01/11/2015   HCT 34.9* 12/28/2014   PLT 313 01/11/2015   PLT 224 12/28/2014   LYMPHOPCT 11.5 12/28/2014   MONOPCT 18.9 12/28/2014   EOSPCT 0.7 12/28/2014   BASOPCT 0.5 12/28/2014    CMP: Lab Results  Component Value Date   NA 142 01/11/2015   NA 130* 12/28/2014   K 4.7 01/11/2015   K 3.8 12/28/2014   CL 100* 01/11/2015   CL 94* 12/28/2014   CO2 30 01/11/2015   CO2 24 12/28/2014   BUN 63* 01/11/2015   BUN 57* 12/28/2014   CREATININE 14.74* 01/11/2015   CREATININE 5.55* 12/28/2014   PROT 8.0 12/23/2014   ALBUMIN 3.1* 01/11/2015   ALBUMIN 3.9 12/23/2014   ALKPHOS 54 12/23/2014   AST 19 12/23/2014   ALT 24 12/23/2014  .   Total Time in preparing paper work, data evaluation and todays exam - 35 minutes  Mariela Rex M.D on 01/15/2015 at 9:21 AM

## 2015-01-15 NOTE — Clinical Social Work Placement (Signed)
   CLINICAL SOCIAL WORK PLACEMENT  NOTE  Date:  01/15/2015  Patient Details  Name: Ryan SilversmithCharles A Bucker MRN: [id number] Date of Birth: 1971-03-02  Clinical Social Work is seeking post-discharge placement for this patient at the Skilled  Nursing Facility level of care (*CSW will initial, date and re-position this form in  chart as items are completed):  Yes   Patient/family provided with [facility] Clinical Social Work Department's list of facilities offering this level of care within the geographic area requested by the patient (or if unable, by the patient's family).  Yes   Patient/family informed of their freedom to choose among providers that offer the needed level of care, that participate in Medicare, Medicaid or managed care program needed by the patient, have an available bed and are willing to accept the patient.  Yes   Patient/family informed of [facility]'s ownership interest [facility] and [facility], as well as of the fact that they are under no obligation to receive care at these facilities.  PASRR submitted to EDS on 01/14/15     PASRR number received on 01/14/15     Existing PASRR number confirmed on       FL2 transmitted to all facilities in geographic area requested by pt/family on 01/13/15     FL2 transmitted to all facilities within larger geographic area on       Patient informed that his/her managed care company has contracts with or will negotiate with certain facilities, including the following:        Yes   Patient/family informed of bed offers received.  Patient [facility] bed [facility]     Physician recommends and patient chooses bed at      Patient to be transferred to [facility] on 01/15/15.  Patient to be transferred to facility by EMS     Patient family notified on 01/15/15 of transfer.  Name of family member notified:  Wife     PHYSICIAN       Additional Comment:     _______________________________________________ Ned Cardara N Boden Stucky, LCSW 01/15/2015, 3:28 PM

## 2015-01-15 NOTE — Plan of Care (Signed)
Problem: Discharge Progression Outcomes Goal: Barriers To Progression Addressed/Resolved Outcome: Progressing Patient prefers to be called Leonette Mostharles. 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 History of HTN and metabolic syndrome controlled by home meds. Moderate fall risk - no bed alarm activated, hourly rounding performed Goal: Other Discharge Outcomes/Goals Outcome: Progressing Patient is alert and oriented, c/o headache. Tylenol given, improved with sleep as well. Runnin SR on Telemetry, continous pulse ox monitoring continued. Patient refused CPAP tonight, drops in to low 80s and comes back up to 90s on RA. Respiratory aware, continue to monitor.

## 2015-01-15 NOTE — Discharge Summary (Signed)
Ryan [name], is a 44 y.o. male  DOB 09/15/1970  MRN [id number].  Admission date:  12/28/2014  Admitting Physician  Arnaldo Natal, MD  Discharge Date:  01/15/2015   Primary MD  No primary care provider on file.  Recommendations for primary care physician for things to follow:  DR,[name] on Thursday. ESRD on hemodialysis   Admission Diagnosis  ACUTE DEEP VEIN THROMBOSIS DVT  DVT   Discharge Diagnosis  ACUTE DEEP VEIN THROMBOSIS DVT  DVT    Active Problems:   Acute DVT (deep venous thrombosis)      No past medical history on file.  No past surgical history on file.     History of present illness and  Hospital Course:     Kindly see H&P for history of present illness and admission details, please review complete Labs, Consult reports and Test reports for all details in brief  HPI  from the history and physical done on the day of admission    Hospital Course This is a 44 year old African American male who presented to the Emergency Department via EMS complaining of pain in his right lower extremity. The patient has been having difficulty walking as well as experiencing some dizziness and lightheadedness. He was seen in the Emergency Department a few days ago for the same and admitted to the hospital. Found to have right leg DVT,started on heparin, for 48hrs.swtiched over to ELiquis.seen by vascular for possible thrombectomy,pt  Had thombolysis with TPA on 4/29 .seen by oncology  2, ESRD on hemodialysis. Renal function creatinine was 1.1 April 22 but creatinine continued to worsen with poor urine output ,is peak creatinine of 14 on May 5. Seen by nephrology, for vascular was consulted for a temporary hemodialysis catheter and started on hemodialysis on May 3 itself. On May 13 patient had right IJ  permacath, his temporary femoral dialysis catheter was removed at that time. Patient continued on heparin drip because of his DVT. After permacath placement. Restarted ELIQUIS. [facility] [address].  3. Diabetes mellitus type 2 metformin is stopped because of ESRD. Continue sudden prescription coverage. 4.Morbid obesity continue CPAP as night as tolerated. 5. deconditioning physical therapy recommended rehabilitation.  6.HTN;CONTROLLED;Hydrallazine was stopped due to dizziness.continue metoprolol  Discharge Condition: stable   Follow UP      Follow-up Information    Follow up with [name], [name], MD In 2 days.   Specialty:  Internal Medicine   Contact information:   [address] [place]  [place] [id number] [phone]         Discharge Instructions  and  Discharge Medications       Medication List    STOP taking these medications        hydrochlorothiazide 12.5 MG tablet  Commonly known as:  HYDRODIURIL     metFORMIN 500 MG tablet  Commonly known as:  GLUCOPHAGE      TAKE these medications        ALPRAZolam 0.25 MG tablet  Commonly known as:  XANAX  Take 1 tablet (0.25 mg total) by mouth every 8 (eight) hours as needed for anxiety.     apixaban 2.5 MG Tabs tablet  Commonly known as:  ELIQUIS  Take 1 tablet (2.5 mg total) by mouth 2 (two) times daily.     calcium acetate 667 MG capsule  Commonly known as:  PHOSLO  Take 2 capsules (1,334 mg total) by mouth 3 (three) times daily with meals.     docusate sodium 100 MG capsule  Commonly known as:  COLACE  Take 100 mg by mouth 2 (two) times daily as needed for mild constipation.     docusate sodium 100 MG capsule  Commonly known as:  COLACE  Take 1 capsule (100 mg total) by mouth 2 (two) times daily as needed (for stool softener).     insulin aspart 100 UNIT/ML injection  Commonly known as:  novoLOG  Inject 0-10 Units into the skin 4  (four) times daily -  before meals and at bedtime.     metoprolol tartrate 25 MG tablet  Commonly known as:  LOPRESSOR  Take 25 mg by mouth 2 (two) times daily.     simethicone 80 MG chewable tablet  Commonly known as:  MYLICON  Chew 80 mg by mouth 4 (four) times daily as needed for flatulence.     traMADol 50 MG tablet  Commonly known as:  ULTRAM  Take by mouth 3 (three) times daily as needed for moderate pain.          Diet and Activity recommendation: See Discharge Instructions above   Consults obtained - nephrology, vascular, physical therapy, oncology   Major procedures and Radiology Reports - PLEASE review detailed and final reports for all details, in brief -      Ct Abdomen Pelvis Wo Contrast  12/20/2014   CLINICAL DATA:  Right-sided abdominal pain.  EXAM: CT ABDOMEN AND PELVIS WITHOUT CONTRAST  TECHNIQUE: Multidetector CT imaging of the abdomen and pelvis was performed following the standard protocol without IV contrast.  COMPARISON:  None.  FINDINGS: Unenhanced CT shows no evidence of renal obstruction or calculi. Ureters and bladder are unremarkable.  The liver shows fatty infiltration. The gallbladder, pancreas, adrenal glands and spleen are unremarkable. Bowel shows no evidence of obstruction or inflammation. No free air, free fluid or abscess is identified.  There is a umbilical hernia containing fat and focal area of calcification. No masses or enlarged lymph nodes are seen. IVC filter is present which is tilted to the left. There is some inferior filter leg penetration through the wall of the IVC without apparent complication. Bony structures are unremarkable.  IMPRESSION: 1. No acute findings. 2. Hepatic steatosis.   Electronically Signed   By: Irish LackGlenn  Yamagata M.D.   On: 12/20/2014 09:32   Ct Angio Chest Pe W/cm &/or Wo Cm  12/20/2014   CLINICAL DATA:  Chest pressure. History of DVT with prior IVC filter placement in 2009.  EXAM: CT ANGIOGRAPHY CHEST WITH  CONTRAST   TECHNIQUE: Multidetector CT imaging of the chest was performed using the standard protocol during bolus administration of intravenous contrast. Multiplanar CT image reconstructions and MIPs were obtained to evaluate the vascular anatomy.  CONTRAST:  100 mL Isovue 350 IV  COMPARISON:  Prior study on 01/04/2008 cannot be retrieved. The report is available which demonstrated pulmonary embolism in the right lower lobe pulmonary artery.  FINDINGS: The pulmonary arteries are well opacified. There is no evidence of acute or chronic pulmonary embolism. The thoracic aorta is also well opacified and shows normal caliber without evidence of dissection. Proximal great vessels show bovine branching anatomy and normal patency. The heart size is normal. No pleural or pericardial fluid is seen.  Lungs show no evidence of edema, consolidation, nodule or pneumothorax. Mild pleural thickening due to subpleural fat is present bilaterally. No enlarged lymph nodes are seen. No airway obstruction. The visualized upper abdomen is unremarkable.  Review of the MIP images confirms the above findings.  IMPRESSION: No evidence of pulmonary embolism or other acute findings in the chest.   Electronically Signed   By: Irish LackGlenn  Yamagata M.D.   On: 12/20/2014 09:36   Ct Abdomen Pelvis W Contrast  12/24/2014   CLINICAL DATA:  Acute onset of right lower quadrant abdominal pain and right leg pain and swelling for 4 days. Initial encounter.  EXAM: CT ABDOMEN AND PELVIS WITH CONTRAST  TECHNIQUE: Multidetector CT imaging of the abdomen and pelvis was performed using the standard protocol following bolus administration of intravenous contrast.  CONTRAST:  125 mL of Omnipaque 300 IV contrast  COMPARISON:  CT of the abdomen and pelvis from 12/20/2014  FINDINGS: The visualized lung bases are clear.  There is complete occlusion of the visualized right common femoral vein and its more inferior branches, as well as the left common femoral vein, though the left  profunda femoral vein and superficial femoral vein appear to demonstrate some flow. Diffuse soft tissue edema is noted surrounding the vasculature at the right thigh, with more superficial soft tissue edema also seen.  Complete thrombosis extends along the common iliac veins bilaterally, and along the inferior vena cava, to approximately 3.7 cm above the level of the IVC filter. This resolves just below the level of the renal veins. There is diffuse soft tissue inflammation along the distended thrombosed venous vasculature, compatible with acute thrombophlebitis.  The liver and spleen are unremarkable in appearance. The gallbladder is within normal limits. The pancreas and adrenal glands are unremarkable.  The kidneys are unremarkable in appearance. There is no evidence of hydronephrosis. No renal or ureteral stones are seen. No perinephric stranding is appreciated.  No free fluid is identified. The small bowel is unremarkable in appearance. The stomach is within normal limits.  The appendix is normal in caliber, without evidence of appendicitis. Scattered diverticulosis is noted along the sigmoid colon, without evidence of diverticulitis.  The bladder is mildly distended. Mild soft tissue inflammation about the bladder could reflect cystitis. Would correlate clinically for associated symptoms. The prostate remains normal in size.  Enlarged bilateral inguinal nodes, measuring 2.5 cm in short axis on the right and 1.9 cm in short axis on the left, may reflect the thrombophlebitis, though biopsy could be considered as deemed clinically appropriate, as enlarged nodes were also seen on the recent prior study, when thrombophlebitis was less prominent.  No acute osseous abnormalities are identified.  IMPRESSION: 1. Complete occlusion of the right common femoral vein, profunda femoral vein and superficial femoral vein,  with diffuse surrounding soft tissue edema. Soft tissue edema extends more superficially. Occlusion of  the left common femoral vein, though the left profunda femoral vein and superficial femoral vein appear to demonstrate some flow. 2. Complete thrombosis extends along the common iliac veins bilaterally, and along the inferior vena cava, to approximately 3.7 cm above the level of the IVC filter. This resolves just below the level of the renal veins. Diffuse associated soft tissue inflammation along the distended thrombosed veins, compatible with acute thrombophlebitis. 3. Mild soft tissue inflammation about the bladder could reflect cystitis. Would correlate clinically for associated symptoms. 4. Enlarged bilateral inguinal nodes, measuring up to 2.5 cm on the right, may reflect thrombophlebitis, though biopsy could be considered as deemed clinically appropriate, as these were also seen on the recent prior study, when thrombophlebitis was less prominent. 5. Scattered diverticulosis along the sigmoid colon, without evidence of diverticulitis.  These results were called by telephone at the time of interpretation on 12/24/2014 at 12:46 am to Dr. Darnelle Catalan, who verbally acknowledged these results.   Electronically Signed   By: Roanna Raider M.D.   On: 12/24/2014 01:03   US Renal  12/30/2014   CLINICAL DATA:  Acute renal failure, history of DVT with IVC filter  EXAM: RENAL / URINARY TRACT ULTRASOUND COMPLETE  COMPARISON:  CT abdomen and pelvis 12/23/2014  FINDINGS: Right Kidney:  Length: 12.2 cm. Normal cortical thickness. Increased cortical echogenicity. No mass, hydronephrosis or shadowing calcification.  Left Kidney:  Length: 12.7 cm. Normal cortical thickness. Increased cortical echogenicity. No mass, hydronephrosis or shadowing calcification.  Bladder:  Decompressed, voided prior to exam, inadequately evaluated.  IMPRESSION: Increased renal cortical echogenicity bilaterally consistent with medical renal disease.  No sonographic evidence of renal mass or urinary tract obstruction.   Electronically Signed   By: Ulyses Southward M.D.   On: 12/30/2014 09:22   US Carotid Bilateral  12/20/2014   CLINICAL DATA:  Pre syncopal episode, hypertension, hyperlipidemia, diabetes  EXAM: BILATERAL CAROTID DUPLEX ULTRASOUND  TECHNIQUE: Wallace Cullens scale imaging, color Doppler and duplex ultrasound were performed of bilateral carotid and vertebral arteries in the neck.  COMPARISON:  None.  FINDINGS: Criteria: Quantification of carotid stenosis is based on velocity parameters that correlate the residual internal carotid diameter with NASCET-based stenosis levels, using the diameter of the distal internal carotid lumen as the denominator for stenosis measurement.  The following velocity measurements were obtained:  RIGHT  ICA:  54/28 cm/sec  CCA:  88/14 cm/sec  SYSTOLIC ICA/CCA RATIO:  0.6  DIASTOLIC ICA/CCA RATIO:  2.0  ECA:  81 cm/sec  LEFT  ICA:  76/36 cm/sec  CCA:  81/17 cm/sec  SYSTOLIC ICA/CCA RATIO:  0.9  DIASTOLIC ICA/CCA RATIO:  2.1  ECA:  70 cm/sec  RIGHT CAROTID ARTERY: Minor echogenic shadowing plaque formation. No hemodynamically significant right ICA stenosis, velocity elevation, or turbulent flow. Degree of narrowing less than 50%.  RIGHT VERTEBRAL ARTERY:  Antegrade  LEFT CAROTID ARTERY: Similar scattered minor echogenic plaque formation. No hemodynamically significant left ICA stenosis, velocity elevation, or turbulent flow.  LEFT VERTEBRAL ARTERY:  Antegrade  IMPRESSION: Minor carotid atherosclerosis. No hemodynamically significant ICA stenosis. Degree of narrowing less than 50% bilaterally.   Electronically Signed   By: [name].  Shick M.D.   On: 12/20/2014 16:38   Nm Pet Nopr Skull Base To Thigh  12/25/2014   CLINICAL DATA:  Initial treatment strategy for bilateral inguinal adenopathy. Weight loss. Recurrent deep venous thrombosis. Evaluate for lymphoma.  EXAM: NUCLEAR MEDICINE  PET SKULL BASE TO THIGH  TECHNIQUE: 12.8 mCi F-18 FDG was injected intravenously. Full-ring PET imaging was performed from the skull base to thigh after the  radiotracer. CT data was obtained and used for attenuation correction and anatomic localization.  FASTING BLOOD GLUCOSE:  Value: 106 mg/dl  COMPARISON:  Abdominal pelvic CT of 12/23/2014. Chest CT 12/20/2014.  FINDINGS: Moderate degradation, secondary to patient body habitus.  NECK  Given above limitations, no abnormal cervical nodal activity.  CHEST  No gross thoracic nodal hypermetabolism.  ABDOMEN/PELVIS  Right external iliac node measures 1.9 by 2.7 cm and a S.U.V. max of 9.0 on image 226.  Left external iliac node measures 9 mm and a S.U.V. max of 5.9 on image 226.  Bilateral mildly hypermetabolic inguinal adenopathy. A right-sided index node measures 1.8 by 3.7 cm and a S.U.V. max of 3.5 on image 271.  More diffuse low-level hypermetabolism is identified about the proximal right thigh. This is presumably related to subacute thrombus.  SKELETON  No abnormal marrow activity.  CT IMAGES PERFORMED FOR ATTENUATION CORRECTION  No cervical adenopathy. Chest abdomen and pelvic findings deferred to recent diagnostic CTs. Hepatic steatosis. Vicarious excretion of contrast by the gallbladder. IVC filter.  IMPRESSION: 1. Moderately degraded exam secondary to patient body habitus. 2. External iliac and inguinal hypermetabolic adenopathy. Although this could be reactive or neoplastic, the extent of hypermetabolism within the right external iliac node is suspicious for a lymphoproliferative process. Potential clinical strategies include sampling of inguinal or right external iliac nodes (if possible) versus CT followup after treatment of the acute thrombophlebitis (4-6 weeks). 3. No convincing evidence of extrapelvic hypermetabolic adenopathy.   Electronically Signed   By: Jeronimo Greaves M.D.   On: 12/25/2014 15:46   US Venous Img Lower Unilateral Right  12/24/2014   CLINICAL DATA:  Acute onset of right leg pain and swelling. Initial encounter.  EXAM: RIGHT LOWER EXTREMITY VENOUS DOPPLER ULTRASOUND  TECHNIQUE: Gray-scale  sonography with graded compression, as well as color Doppler and duplex ultrasound were performed to evaluate the lower extremity deep venous systems from the level of the common femoral vein and including the common femoral, femoral, profunda femoral, popliteal and calf veins including the posterior tibial, peroneal and gastrocnemius veins when visible. The superficial great saphenous vein was also interrogated. Spectral Doppler was utilized to evaluate flow at rest and with distal augmentation maneuvers in the common femoral, femoral and popliteal veins.  COMPARISON:  None.  FINDINGS: Contralateral Common Femoral Vein: Occlusive thrombus is noted filling the contralateral common femoral vein.  Common Femoral Vein: Occlusive thrombus is noted filling the common femoral vein.  Saphenofemoral Junction: Occlusive thrombus is noted filling the saphenofemoral junction.  Profunda Femoral Vein: Occlusive thrombus is noted within the profunda femoral vein.  Femoral Vein: Occlusive thrombus is noted within the femoral vein.  Popliteal Vein: Mostly occlusive thrombus is noted within the popliteal vein. A tiny amount of flow is noted within the popliteal vein, though the vein is noncompressible.  Calf Veins: No evidence of thrombus. The posterior tibial vein is visualized; remaining calf veins are not seen. Normal compressibility and flow on color Doppler imaging.  Superficial Great Saphenous Vein: Occlusive thrombus is noted within the great saphenous vein.  Venous Reflux:  None.  Other Findings:  None.  IMPRESSION: There is occlusive thrombus filling nearly all of the visualized veins of the right leg. There is also occlusive thrombus within the contralateral common femoral vein; the remainder of the left leg is not  assessed on this study.  These results were called by telephone at the time of interpretation on 12/24/2014 at 12:38 am to Dr. Darnelle Catalan, who verbally acknowledged these results.   Electronically Signed   By:  Roanna Raider M.D.   On: 12/24/2014 01:03   Dg Chest Port 1 View  12/31/2014   CLINICAL DATA:  44 year old male status post hemodialysis catheter placements  EXAM: PORTABLE CHEST - 1 VIEW  COMPARISON:  Prior chest x-ray 12/20/2014  FINDINGS: Interval placement of a left IJ approach non tunneled hemodialysis catheter. Catheter tip projects over the mid right atrium. Very low inspiratory volumes. Mild pulmonary vascular congestion without overt edema. No evidence of pneumothorax.  IMPRESSION: The tip of the left IJ approach non tunneled hemodialysis catheter projects over the mid right atrium.   Electronically Signed   By: Malachy Moan M.D.   On: 12/31/2014 19:02   Dg Chest Port 1 View  12/20/2014   CLINICAL DATA:  Chest pain, chest tightness, shortness breath x1 day. History of pulmonary emboli.  EXAM: PORTABLE CHEST - 1 VIEW  COMPARISON:  08/24/2004 by report only  FINDINGS: Lungs are clear. Heart size and mediastinal contours are within normal limits. No effusion. Visualized skeletal structures are unremarkable.  IMPRESSION: No acute cardiopulmonary disease.   Electronically Signed   By: Corlis Leak M.D.   On: 12/20/2014 08:53   Dg Abd Portable 1v  12/31/2014   CLINICAL DATA:  Recent hemodialysis catheter placement  EXAM: PORTABLE ABDOMEN - 1 VIEW  COMPARISON:  Portable exam 1835 hr compared to CT exam of 12/23/2014  FINDINGS: No definite central line/dialysis catheter is identified.  IVC filter projects over lumbar spine.  Air-filled loops of air-filled nondilated large and small bowel.  Bones demineralized.  IMPRESSION: No definite dialysis catheter is visualized.   Electronically Signed   By: Ulyses Southward M.D.   On: 12/31/2014 19:00    Micro Results    No results found for this or any previous visit (from the past 240 hour(s)).     Today   Subjective:   Ryan [name] today has no headache,no chest abdominal pain,no new weakness tingling or numbness, feels much better wants to go home  today.  Objective:   Blood pressure 158/98, pulse 75, temperature 98.5 F (36.9 C), temperature source Oral, resp. rate 20, height 5' 6.97" (1.701 m), weight 116.5 kg (256 lb 13.4 oz), SpO2 92 %.   Intake/Output Summary (Last 24 hours) at 01/15/15 1140 Last data filed at 01/15/15 0907  Gross per 24 hour  Intake    240 ml  Output   3500 ml  Net  -3260 ml    Exam Awake Alert, Oriented x 3, No new F.N deficits, Normal affect [place].AT,PERRAL Supple Neck,No JVD, No cervical lymphadenopathy appriciated.  Symmetrical Chest wall movement, Good air movement bilaterally, CTAB RRR,No Gallops,Rubs or new Murmurs, No Parasternal Heave +ve B.Sounds, Abd Soft, Non tender, No organomegaly appriciated, No rebound -guarding or rigidity. No Cyanosis, Clubbing or edema, No new Rash or bruise  Data Review   CBC w Diff:  Lab Results  Component Value Date   WBC 7.6 01/11/2015   WBC 11.3* 12/28/2014   HGB 8.0* 01/15/2015   HGB 11.7* 12/28/2014   HCT 25.7* 01/11/2015   HCT 34.9* 12/28/2014   PLT 313 01/11/2015   PLT 224 12/28/2014   LYMPHOPCT 11.5 12/28/2014   MONOPCT 18.9 12/28/2014   EOSPCT 0.7 12/28/2014   BASOPCT 0.5 12/28/2014    CMP:  Lab  Results  Component Value Date   NA 142 01/11/2015   NA 130* 12/28/2014   K 4.7 01/11/2015   K 3.8 12/28/2014   CL 100* 01/11/2015   CL 94* 12/28/2014   CO2 30 01/11/2015   CO2 24 12/28/2014   BUN 63* 01/11/2015   BUN 57* 12/28/2014   CREATININE 14.74* 01/11/2015   CREATININE 5.55* 12/28/2014   PROT 8.0 12/23/2014   ALBUMIN 3.1* 01/11/2015   ALBUMIN 3.9 12/23/2014   ALKPHOS 54 12/23/2014   AST 19 12/23/2014   ALT 24 12/23/2014  .   Total Time in preparing paper work, data evaluation and todays exam - 35 minutes  Gershom Brobeck M.D on 01/15/2015 at 11:40 AM

## 2015-01-15 NOTE — Progress Notes (Signed)
Physical Therapy Treatment Patient Details Name: Ryan SilversmithCharles A [name] MRN: [id number] DOB: [birth year] Today's Date: 01/15/2015    History of Present Illness 44 yo Male came to ED with acute DVT, edema and shortness of breath. Patient was started on Heparin drip  and on 4/28 underwent mechanical thrombectomy R/L venous system and IVC filter placement. Pateint has a PMH significant for right MCL tear. He also reports dizziness with positional change.    PT Comments    Pt was seen for updating functional abiltiy with some dizzy complaints today after receiving BP meds.  BP was quite high and about 10 minutes after receiving meds PT walked pt.  He is able to walk with slow pace but did let nursing know about sats and pulse readings that were good.  Follow Up Recommendations  SNF     Equipment Recommendations   (Bariatric walker)    Recommendations for Other Services       Precautions / Restrictions Precautions Precautions: Fall Restrictions Weight Bearing Restrictions: No Other Position/Activity Restrictions: FWB    Mobility  Bed Mobility Overal bed mobility: Needs Assistance Bed Mobility: Supine to Sit     Supine to sit: Min assist     General bed mobility comments: Pt did not attempt to do alone and asked for use of PT's hand to pull up  Transfers Overall transfer level: Needs assistance Equipment used: Rolling walker (2 wheeled);1 person hand held assist Transfers: Sit to/from [employer]GI CorporationStand;Stand Pivot Transfers Sit to Stand: Min guard;Min assist Stand pivot transfers: Min guard       General transfer comment: Pt was able to stand after resting due to dizziness in sitting up position  Ambulation/Gait Ambulation/Gait assistance: Min guard Ambulation Distance (Feet): 100 Feet Assistive device: Rolling walker (2 wheeled) Gait Pattern/deviations: Step-through pattern;Decreased stride length;Wide base of support;Trunk flexed;Drifts right/left Gait velocity: slow Gait velocity  interpretation: Below normal speed for age/gender     Stairs            Wheelchair Mobility    Modified Rankin (Stroke Patients Only)       Balance Overall balance assessment: Needs assistance Sitting-balance support: Feet supported Sitting balance-Leahy Scale: Good   Postural control: Other (comment) (forward lean with walker propping upright ) Standing balance support: Bilateral upper extremity supported Standing balance-Leahy Scale: Fair                      Cognition Arousal/Alertness: Awake/alert Behavior During Therapy: WFL for tasks assessed/performed Overall Cognitive Status: Within Functional Limits for tasks assessed                      Exercises General Exercises - Lower Extremity Ankle Circles/Pumps: AROM;Both;5 reps Heel Slides: AROM;Both;5 reps Hip ABduction/ADduction: Strengthening;Both;5 reps Hip Flexion/Marching: AROM;Both;5 reps    General Comments General comments (skin integrity, edema, etc.): Pt is having some difficulty today with dizziness but initial AM BP was 158/98.  Nursing  had just medicated this but was feeling dizzy.  O2 sat 96% at rest with pulse 83, then after gait was 98% with pulse 105 and declined as he rested.      Pertinent Vitals/Pain Pain Assessment: No/denies pain    Home Living                      Prior Function            PT Goals (current goals can now be found in the care plan section)  Progress towards PT goals: Progressing toward goals    Frequency  Min 2X/week    PT Plan Current plan remains appropriate    Co-evaluation             End of Session   Activity Tolerance: Patient tolerated treatment well Patient left: in chair;with call bell/phone within reach     Time: 1010-1035 PT Time Calculation (min) (ACUTE ONLY): 25 min  Charges:  $Gait Training: 8-22 mins $Therapeutic Exercise: 8-22 mins                    G Codes:      Ivar DrapeStout, Nisaiah Bechtol E 01/15/2015, 11:12 AM    Samul Dadauth Althia Egolf, PT MS Acute Rehab Dept. Number: [facility] [id number] and [facility] [phone]

## 2015-01-15 NOTE — Progress Notes (Signed)
Patient discharged by EMS via stretcher to [facility]. Belongings transported with family member.

## 2015-01-15 NOTE — Discharge Instructions (Signed)
Dialysis °Dialysis is a procedure that replaces some of the work healthy kidneys do. It is done when you lose about 85-90% of your kidney function. It may also be done earlier if your symptoms may be improved by dialysis. During dialysis, wastes, salt, and extra water are removed from the blood, and the levels of certain chemicals in the blood (such as potassium) are maintained. Dialysis is done in sessions. Dialysis sessions are continued until the kidneys get better. If the kidneys cannot get better, such as in end-stage kidney disease, dialysis is continued for life or until you receive a new kidney (kidney transplant). There are two types of dialysis: hemodialysis and peritoneal dialysis. °WHAT IS HEMODIALYSIS?  °Hemodialysis is a type of dialysis in which a machine called a dialyzer is used to filter the blood. Before beginning hemodialysis, you will have surgery to create a site where blood can be removed from the body and returned to the body (vascular access). There are three types of vascular accesses: °· Arteriovenous fistula. To create this type of access, an artery is connected to a vein (usually in the arm). A fistula takes 1-6 months to develop after surgery. If it develops properly, it usually lasts longer than the other types of vascular accesses. It is also less likely to become infected and cause blood clots. °· Arteriovenous graft. To create this type of access, an artery and a vein in the arm are connected with a tube. A graft may be used within 2-3 weeks of surgery. °· A venous catheter. To create this type of access, a thin, flexible tube (catheter) is placed in a large vein in your neck, chest, or groin. A catheter may be used right away. It is usually used as a temporary access when dialysis needs to begin immediately. °During hemodialysis, blood leaves the body through your access. It travels through a tube to the dialyzer, where it is filtered. The blood then returns to your body through  another tube. °Hemodialysis is usually performed by a health care provider at a hospital or dialysis center three times a week. Visits last about 3-4 hours. It may also be performed with the help of another person at home with training.  °WHAT IS PERITONEAL DIALYSIS? °Peritoneal dialysis is a type of dialysis in which the thin lining of the abdomen (peritoneum) is used as a filter. Before beginning peritoneal dialysis, you will have surgery to place a catheter in your abdomen. The catheter will be used to transfer a fluid called dialysate to and from your abdomen. At the start of a session, your abdomen is filled with dialysate. During the session, wastes, salt, and extra water in the blood pass through the peritoneum and into the dialysate. The dialysate is drained from the body at the end of the session. The process of filling and draining the dialysate is called an exchange. Exchanges are repeated until you have used up all the dialysate for the day. °Peritoneal dialysis may be performed by you at home or at almost any other location. It is done every day. You may need up to five exchanges a day. The amount of time the dialysate is in your body between exchanges is called a dwell. The dwell depends on the number of exchanges needed and the characteristics of the peritoneum. It usually varies from 1.5-3 hours. You may go about your day normally between exchanges. Alternately, the exchanges may be done at night while you sleep, using a machine called a cycler. °WHICH TYPE   OF DIALYSIS SHOULD I CHOOSE?  Both hemodialysis and peritoneal dialysis have advantages and disadvantages. Talk to your health care provider about which type of dialysis would be best for you. Your lifestyle and preferences should be considered along with your medical condition. In some cases, only one type of dialysis may be an option.  Advantages of hemodialysis  It is done less often than peritoneal dialysis.  Someone else can do the  dialysis for you.  If you go to a dialysis center, your health care provider will be able to recognize any problems right away.  If you go to a dialysis center, you can interact with others who are having dialysis. This can provide you with emotional support. Disadvantages of hemodialysis  Hemodialysis may cause cramps and low blood pressure. It may leave you feeling tired on the days you have the treatment.  If you go to a dialysis center, you will need to make weekly appointments and work around the center's schedule.  You will need to take extra care when traveling. If you go to a dialysis center, you will need to make special arrangements to visit a dialysis center near your destination. If you are having treatments at home, you will need to take the dialyzer with you to your destination.  You will need to avoid more foods than you would need to avoid on peritoneal dialysis. Advantages of peritoneal dialysis  It is less likely than hemodialysis to cause cramps and low blood pressure.  You may do exchanges on your own wherever you are, including when you travel.  You do not need to avoid as many foods as you do on hemodialysis. Disadvantages of peritoneal dialysis  It is done more often than hemodialysis.  Performing peritoneal dialysis requires you to have dexterity of the hands. You must also be able to lift bags.  You will have to learn sterilization techniques. You will need to practice them every day to reduce the risk of infection. WHAT CHANGES WILL I NEED TO MAKE TO MY DIET DURING DIALYSIS? Both hemodialysis and peritoneal dialysis require you to make some changes to your diet. For example, you will need to limit your intake of foods high in the minerals phosphorus and potassium. You will also need to limit your fluid intake. Your dietitian can help you plan meals. A good meal plan can improve your dialysis and your health.  WHAT SHOULD I EXPECT WHEN BEGINNING  DIALYSIS? Adjusting to the dialysis treatment, schedule, and diet can take some time. You may need to stop working and may not be able to do some of the things you normally do. You may feel anxious or depressed when beginning dialysis. Eventually, many people feel better overall because of dialysis. Some people are able to return to work after making some changes, such as reducing work intensity. WHERE CAN I FIND MORE INFORMATION?   National Kidney Foundation: www.kidney.org  American Association of Kidney Patients: ResidentialShow.iswww.aakp.org  American Kidney Fund: www.kidneyfund.org Document Released: 11/06/2002 Document Revised: 12/31/2013 Document Reviewed: 10/10/2012 [facility] Patient Information 2015 [place], [place]. This information is not intended to replace advice given to you by your health care provider. Make sure you discuss any questions you have with your health care provider.  End-Stage Kidney Disease The kidneys are two organs that lie on either side of the spine between the middle of the back and the front of the abdomen. The kidneys:   Remove wastes and extra water from the blood.   Produce important hormones.  These help keep bones strong, regulate blood pressure, and help create red blood cells.   Balance the fluids and chemicals in the blood and tissues. End-stage kidney disease occurs when the kidneys are so damaged that they cannot do their job. When the kidneys cannot do their job, life-threatening problems occur. The body cannot stay clean and strong without the help of the kidneys. In end-stage kidney disease, the kidneys cannot get better.You need a new kidney or treatments to do some of the work healthy kidneys do in order to stay alive. CAUSES  End-stage kidney disease usually occurs when a long-lasting (chronic) kidney disease gets worse. It may also occur after the kidneys are suddenly damaged (acute kidney injury).  SYMPTOMS   Swelling (edema) of the legs, ankles, or feet.    Tiredness (lethargy).   Nausea or vomiting.   Confusion.   Problems with urination, such as:   Decreased urine production.   Frequent urination, especially at night.   Frequent accidents in children who are potty trained.   Muscle twitches and cramps.   Persistent itchiness.   Loss of appetite.   Headaches.   Abnormally dark or light skin.   Numbness in the hands or feet.   Easy bruising.   Frequent hiccups.   Menstruation stops. DIAGNOSIS  Your health care provider will measure your blood pressure and take some tests. These may include:   Urine tests.   Blood tests.   Imaging tests, such as:   An ultrasound exam.   Computed tomography (CT).  A kidney biopsy. TREATMENT  There are two treatments for end-stage kidney disease:   A procedure that removes toxic wastes from the body (dialysis).   Receiving a new kidney (kidney transplant). Both of these treatments have serious risks and consequences. Your health care provider will help you determine which treatment is best for you based on your health, age, and other factors. In addition to having dialysis or a kidney transplant, you may need to take medicines to control high blood pressure (hypertension) and cholesterol and to decrease phosphorus levels in your blood.  HOME CARE INSTRUCTIONS  Follow your prescribed diet.   Take medicines only as directed by your health care provider.   Do not take any new medicines (prescription, over-the-counter, or nutritional supplements) unless approved by your health care provider. Many medicines can worsen your kidney damage or need to have the dose adjusted.   Keep all follow-up visits as directed by your health care provider. MAKE SURE YOU:  Understand these instructions.  Will watch your condition.  Will get help right away if you are not doing well or get worse. Document Released: 11/06/2003 Document Revised: 12/31/2013 Document  Reviewed: 04/14/2012 [facility] Patient Information 2015 [place], [place]. This information is not intended to replace advice given to you by your health care provider. Make sure you discuss any questions you have with your health care provider.

## 2015-01-15 NOTE — Progress Notes (Signed)
Patient discharging to [facility], report called to RN. EMS called for transportation. Bo McclintockBrewer,Tyee Vandevoorde S, RN

## 2015-01-15 NOTE — Care Management Note (Signed)
Patient is scheduled for 1st out patient dialysis tomorrow 01/17/15 at 11:30  [facility] [address]  Please update me once SNF placement is completed so that I can update center on a confirmed start date.

## 2015-01-15 NOTE — Progress Notes (Signed)
[facility]  ROUNDING NOTE   Subjective:   Hemodialysis yesterday.  Laying in bed. UOP 1300.   Objective:  Vital signs in last 24 hours:  Temp:  [98.5 F (36.9 C)-99.1 F (37.3 C)] 98.5 F (36.9 C) (05/18 0524) Pulse Rate:  [63-81] 75 (05/18 0524) Resp:  [14-23] 20 (05/18 0524) BP: (155-176)/(88-102) 158/98 mmHg (05/18 0524) SpO2:  [92 %-96 %] 92 % (05/18 0524) FiO2 (%):  [95 %] 95 % (05/17 2140) Weight:  [116.5 kg (256 lb 13.4 oz)] 116.5 kg (256 lb 13.4 oz) (05/17 1352)  Weight change:  Filed Weights   01/09/15 1340 01/11/15 1200 01/14/15 1352  Weight: 119.4 kg (263 lb 3.7 oz) 118.9 kg (262 lb 2 oz) 116.5 kg (256 lb 13.4 oz)    Intake/Output: I/O last 3 completed shifts: In: 240 [P.O.:240] Out: 3950 [Urine:1450; Other:2500]   Intake/Output this shift:     Physical Exam: General: NAD  Head: Normocephalic, atraumatic. Moist oral mucosal membranes  Eyes: Anicteric  Neck: Supple, trachea midline  Lungs:  Clear to auscultation normal effort  Heart: Regular rate and rhythm  Abdomen:  Soft, nontender, BS present  Extremities: 2+ peripheral edema.  Neurologic: Nonfocal, moving all four extremities  Skin: No lesions  Access: IJ permcath    Basic Metabolic Panel:  Recent Labs Lab 01/09/15 0441 01/11/15 0552  NA 141 142  K 4.5 4.7  CL 100* 100*  CO2 29 30  GLUCOSE 106* 111*  BUN 67* 63*  CREATININE 14.57* 14.74*  CALCIUM 8.0* 8.1*  PHOS  --  7.0*    Liver Function Tests:  Recent Labs Lab 01/11/15 0552  ALBUMIN 3.1*   No results for input(s): LIPASE, AMYLASE in the last 168 hours. No results for input(s): AMMONIA in the last 168 hours.  CBC:  Recent Labs Lab 01/10/15 1815 01/11/15 1008 01/15/15 0500  WBC 5.5 7.6  --   HGB 8.9* 8.1* 8.0*  HCT 26.9* 25.7*  --   MCV 90.7 92.2  --   PLT 344 313  --     Cardiac Enzymes: No results for input(s): CKTOTAL, CKMB, CKMBINDEX, TROPONINI in the last 168 hours.  BNP: Invalid input(s):  POCBNP  CBG:  Recent Labs Lab 01/14/15 0716 01/14/15 1411 01/14/15 1625 01/14/15 2203 01/15/15 0714  GLUCAP 99 90 103* 110* 95    Microbiology: Results for orders placed or performed during the hospital encounter of 12/28/14  Culture, blood (single)     Status: None (Preliminary result)   Collection Time: 12/27/14  8:34 PM  Result Value Ref Range Status   Micro Text Report   Preliminary       COMMENT                   NO GROWTH IN 36 HOURS   ANTIBIOTIC                                                      Culture, blood (single)     Status: None (Preliminary result)   Collection Time: 12/27/14  8:40 PM  Result Value Ref Range Status   Micro Text Report   Preliminary       COMMENT                   NO GROWTH IN 36 HOURS  ANTIBIOTIC                                                        Coagulation Studies: No results for input(s): LABPROT, INR in the last 72 hours.  Urinalysis: No results for input(s): COLORURINE, LABSPEC, PHURINE, GLUCOSEU, HGBUR, BILIRUBINUR, KETONESUR, PROTEINUR, UROBILINOGEN, NITRITE, LEUKOCYTESUR in the last 72 hours.  Invalid input(s): APPERANCEUR    Imaging: No results found.   Medications:     . apixaban  2.5 mg Oral BID  . epoetin (EPOGEN/PROCRIT) injection  10,000 Units Subcutaneous Weekly  . feeding supplement (NEPRO CARB STEADY)  237 mL Oral BID BM  . insulin aspart  0-10 Units Subcutaneous TID AC & HS  . LORazepam  1 mg Oral QHS  . metoprolol tartrate  25 mg Oral BID  . ondansetron (ZOFRAN) IV  4 mg Intravenous 6 times per day  . polyethylene glycol  17 g Oral Daily   acetaminophen, ALPRAZolam, alum & mag hydroxide-simeth, docusate sodium, ondansetron (ZOFRAN) IV, ondansetron, simethicone, sodium chloride  Assessment/ Plan:  44 y.o. 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  1. ESRD (ATN, no recovery):  Multiple contrast exposure this admission. Baseline creatinine of 0.99. 12/20/2014 - Urine output improving. However creatinine not improving. Continue Dialysis. TTS schedule. Outpatient planning for [facility] [address]   2. Peripheral edema - UF with dialysis.   3. Acute thrombosis I82.412: currently on apixaban 2.5mg  po BID. Has IVC filter in place  4. Anemia of CKD. Hemoglobin 8.  - continue epogen 10000 units [place] weekly  5. Secondary Hyperparathyroidism: phos of 7 - not currently on binders. Will start calcium acetate 2 with meals.     LOS: 18 Jyaire Koudelka 5/18/20169:03 AM

## 2015-01-20 ENCOUNTER — Encounter: Payer: Self-pay | Admitting: Vascular Surgery

## 2015-01-29 ENCOUNTER — Ambulatory Visit: Payer: 59

## 2015-02-05 ENCOUNTER — Ambulatory Visit: Admission: RE | Admit: 2015-02-05 | Payer: 59 | Source: Ambulatory Visit

## 2015-02-24 ENCOUNTER — Inpatient Hospital Stay: Admission: RE | Admit: 2015-02-24 | Payer: 59 | Source: Ambulatory Visit

## 2015-02-24 ENCOUNTER — Ambulatory Visit
Admission: RE | Admit: 2015-02-24 | Discharge: 2015-02-24 | Disposition: A | Discharge status: Home / Self Care | Payer: 59 | Source: Ambulatory Visit | Attending: Vascular Surgery | Admitting: Vascular Surgery

## 2015-02-24 ENCOUNTER — Encounter: Payer: Self-pay | Admitting: *Deleted

## 2015-02-24 ENCOUNTER — Encounter
Admission: RE | Disposition: A | Discharge status: Home / Self Care | Payer: Self-pay | Source: Ambulatory Visit | Attending: Vascular Surgery

## 2015-02-24 DIAGNOSIS — Z4901 Encounter for fitting and adjustment of extracorporeal dialysis catheter: Secondary | ICD-10-CM | POA: Diagnosis not present

## 2015-02-24 DIAGNOSIS — Z86718 Personal history of other venous thrombosis and embolism: Secondary | ICD-10-CM | POA: Diagnosis not present

## 2015-02-24 DIAGNOSIS — Z79899 Other long term (current) drug therapy: Secondary | ICD-10-CM | POA: Insufficient documentation

## 2015-02-24 DIAGNOSIS — Z823 Family history of stroke: Secondary | ICD-10-CM | POA: Diagnosis not present

## 2015-02-24 DIAGNOSIS — N186 End stage renal disease: Secondary | ICD-10-CM | POA: Insufficient documentation

## 2015-02-24 DIAGNOSIS — Z8249 Family history of ischemic heart disease and other diseases of the circulatory system: Secondary | ICD-10-CM | POA: Diagnosis not present

## 2015-02-24 DIAGNOSIS — Z7901 Long term (current) use of anticoagulants: Secondary | ICD-10-CM | POA: Insufficient documentation

## 2015-02-24 DIAGNOSIS — I8390 Asymptomatic varicose veins of unspecified lower extremity: Secondary | ICD-10-CM | POA: Diagnosis not present

## 2015-02-24 DIAGNOSIS — I739 Peripheral vascular disease, unspecified: Secondary | ICD-10-CM | POA: Insufficient documentation

## 2015-02-24 DIAGNOSIS — E780 Pure hypercholesterolemia: Secondary | ICD-10-CM | POA: Insufficient documentation

## 2015-02-24 DIAGNOSIS — Z809 Family history of malignant neoplasm, unspecified: Secondary | ICD-10-CM | POA: Diagnosis not present

## 2015-02-24 DIAGNOSIS — Z91041 Radiographic dye allergy status: Secondary | ICD-10-CM | POA: Insufficient documentation

## 2015-02-24 DIAGNOSIS — Z992 Dependence on renal dialysis: Secondary | ICD-10-CM | POA: Diagnosis not present

## 2015-02-24 DIAGNOSIS — I87009 Postthrombotic syndrome without complications of unspecified extremity: Secondary | ICD-10-CM | POA: Diagnosis not present

## 2015-02-24 DIAGNOSIS — Z881 Allergy status to other antibiotic agents status: Secondary | ICD-10-CM | POA: Insufficient documentation

## 2015-02-24 HISTORY — DX: Essential (primary) hypertension: I10

## 2015-02-24 HISTORY — PX: PERIPHERAL VASCULAR CATHETERIZATION: SHX172C

## 2015-02-24 HISTORY — DX: Type 2 diabetes mellitus without complications: E11.9

## 2015-02-24 HISTORY — DX: Peripheral vascular disease, unspecified: I73.9

## 2015-02-24 SURGERY — DIALYSIS/PERMA CATHETER REMOVAL
Anesthesia: Moderate Sedation

## 2015-02-24 SURGICAL SUPPLY — 6 items
DRSG TEGADERM 4X4.75 (GAUZE/BANDAGES/DRESSINGS) ×3 IMPLANT
GLOVE SURG SYN 7.0 (GLOVE) ×3 IMPLANT
GLOVE SURG SYN 8.0 (GLOVE) ×3 IMPLANT
HEMOSTAT SURGICEL 2X3 (HEMOSTASIS) ×3 IMPLANT
SCALPEL PROTECTED #11 DISP (BLADE) ×3 IMPLANT
TRAY LACERAT/PLASTIC (MISCELLANEOUS) ×3 IMPLANT

## 2015-02-24 NOTE — CV Procedure (Signed)
      Preoperative diagnosis:   1. ARF with recovery of renal function  Postoperative diagnosis:  1. ARF with recovery of renal function  Procedure:  Removal of right jugular Permcath  Surgeon:  Festus BarrenJason Blain Hunsucker, MD  Anesthesia:  Local  EBL:  Minimal  Indication for the Procedure:  The patient has improved renal function and no longer needs their permcath.  This can be removed.  Risks and benefits are discussed and informed consent is obtained.  Description of the Procedure:  The patient's right neck, chest and existing catheter were sterilely prepped and draped. The area around the catheter was anesthetized copiously with 1% lidocaine. The catheter was dissected out with curved hemostats until the cuff was freed from the surrounding fibrous sheath. The fiber sheath was transected, and the catheter was then removed in its entirety using gentle traction. Pressure was held and sterile dressings were placed. The patient tolerated the procedure well and was taken to the recovery room in stable condition.     Leshawn Houseworth  02/24/2015, 11:13 AM

## 2015-02-24 NOTE — H&P (Signed)
[facility] History & Physical Update  The patient was interviewed and re-examined.  The patient's previous History and Physical has been reviewed and is unchanged.  There is no change in the plan of care. His renal function has returned enough to come off dialysis, and his catheter can be remove  Suzana Sohail, MD  02/24/2015, 9:57 AM

## 2015-02-24 NOTE — Discharge Instructions (Signed)

## 2015-02-25 ENCOUNTER — Encounter: Payer: Self-pay | Admitting: Vascular Surgery

## 2015-03-19 ENCOUNTER — Ambulatory Visit: Admission: RE | Admit: 2015-03-19 | Payer: 59 | Source: Ambulatory Visit | Admitting: Vascular Surgery

## 2015-03-19 ENCOUNTER — Encounter: Admission: RE | Payer: Self-pay | Source: Ambulatory Visit

## 2015-03-19 SURGERY — ARTERIOVENOUS (AV) FISTULA CREATION
Anesthesia: Choice | Laterality: Left

## 2016-02-29 IMAGING — CR DG CHEST 1V PORT
1 series · 1 of 1 positions shown · non-contrast
Comparison: Prior chest x-ray 12/20/2014

CLINICAL DATA: 44-year-old male status post hemodialysis catheter
placements

EXAM:
PORTABLE CHEST - 1 VIEW

[ap]
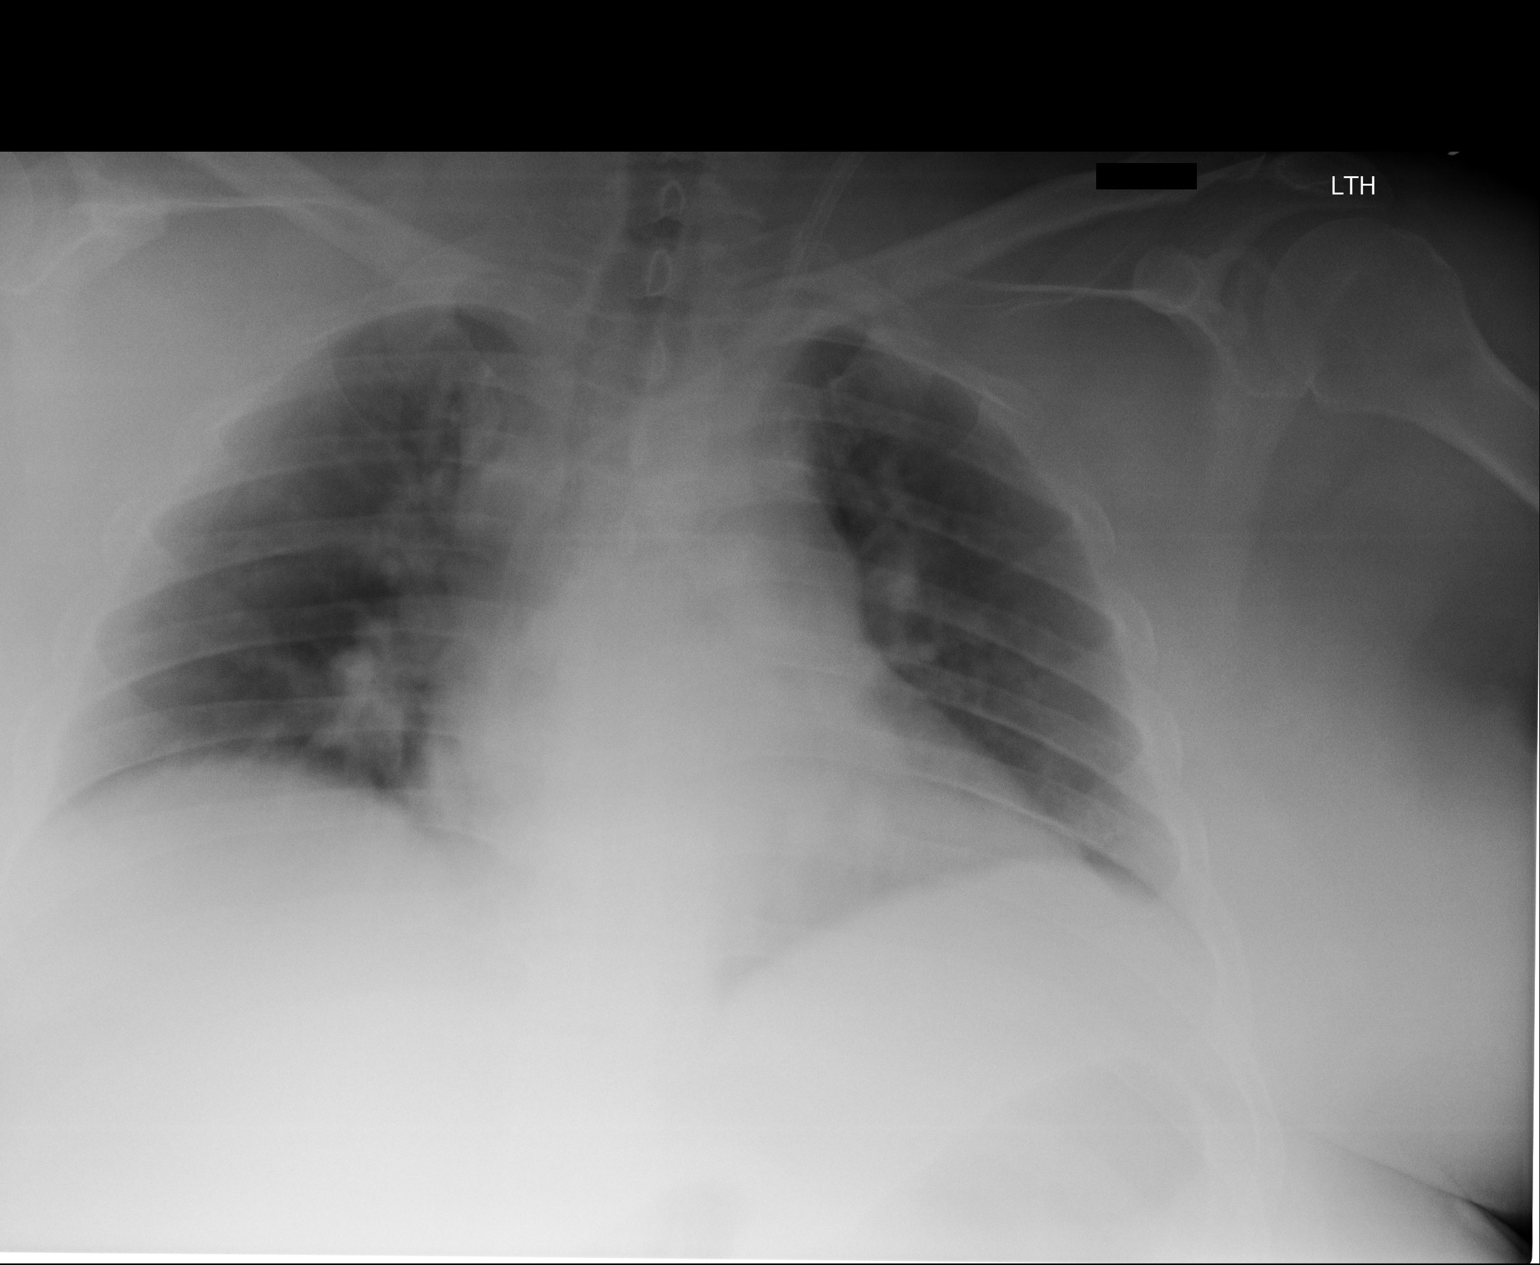

[1 of 1 positions shown; findings below may reference images not displayed]

FINDINGS: Interval placement of a left IJ approach non tunneled hemodialysis
catheter. Catheter tip projects over the mid right atrium. Very low
inspiratory volumes. Mild pulmonary vascular congestion without
overt edema. No evidence of pneumothorax.
IMPRESSION: The tip of the left IJ approach non tunneled hemodialysis catheter
projects over the mid right atrium.

## 2016-02-29 IMAGING — CR DG ABD PORTABLE 1V
1 series · 2 of 2 positions shown · non-contrast
Comparison: Portable exam 4054 hr compared to CT exam of 12/23/2014

CLINICAL DATA: Recent hemodialysis catheter placement

EXAM:
PORTABLE ABDOMEN - 1 VIEW

[Series 1: ap · 0.17mm/px · 2 of 2 slices shown]
[im 1/2]
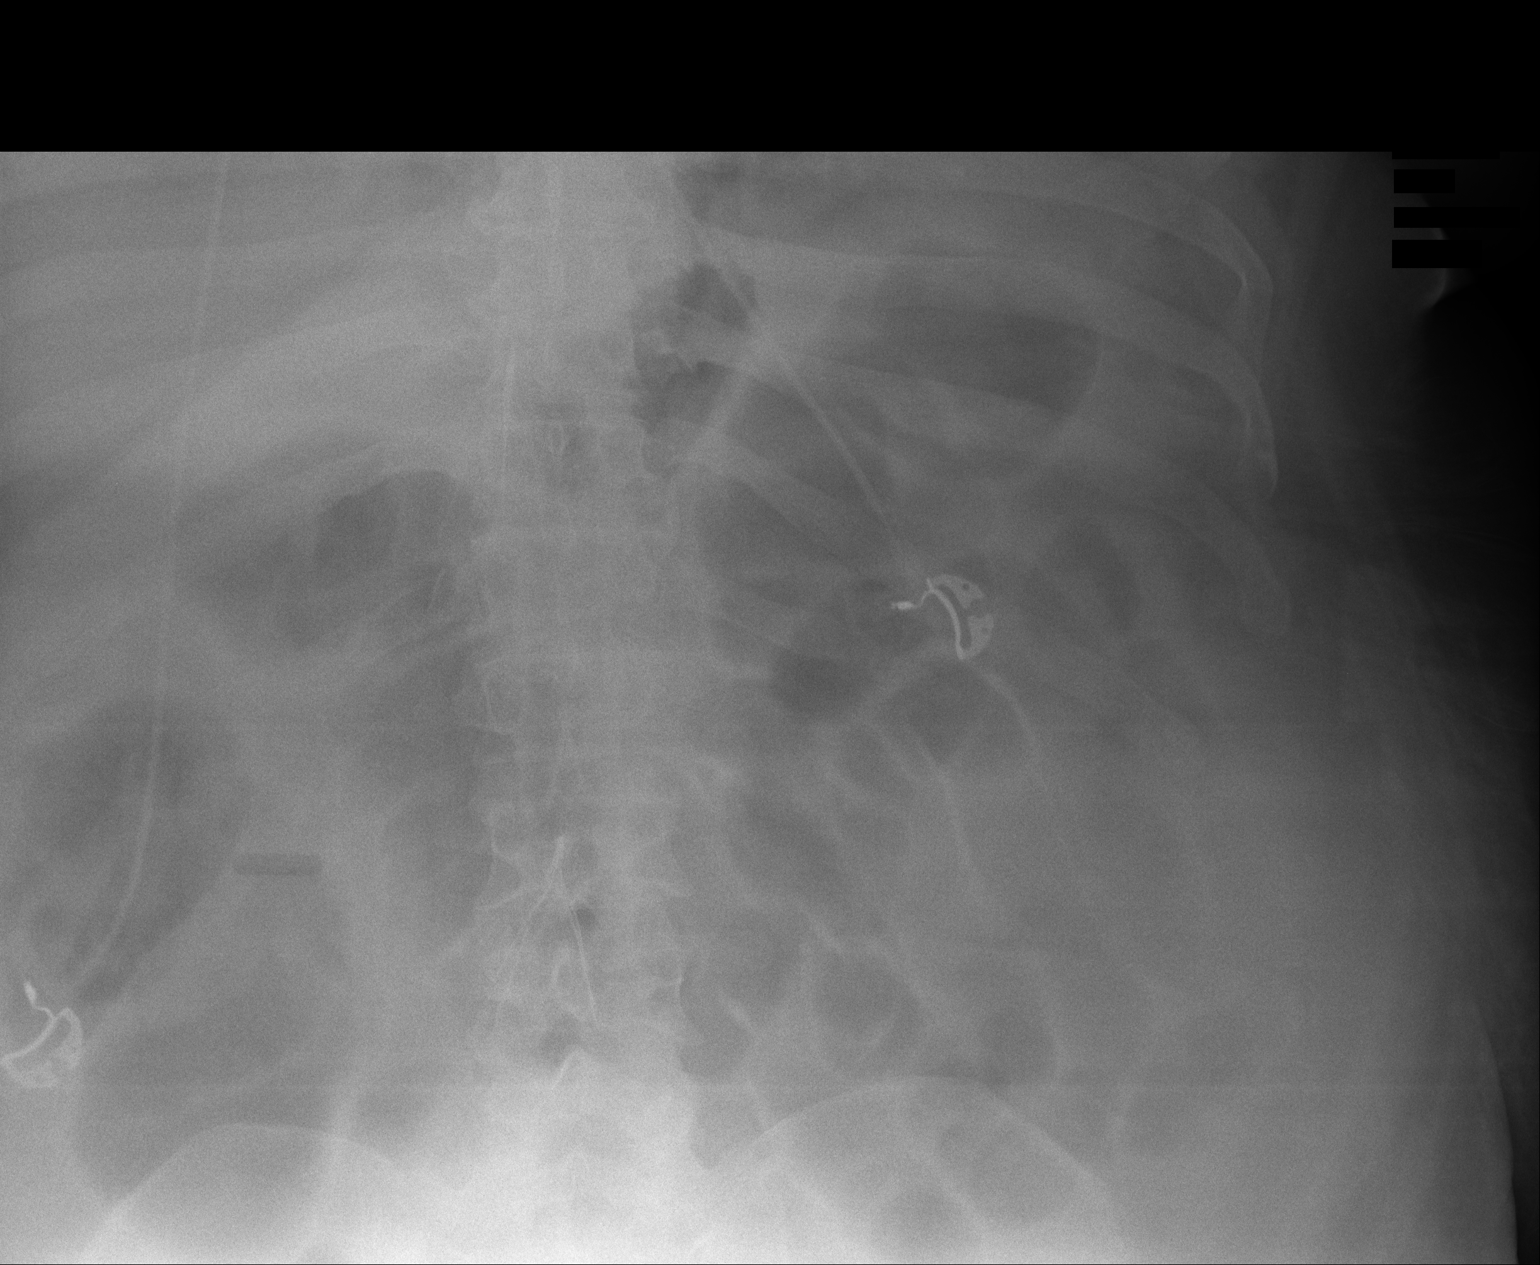
[im 2/2]
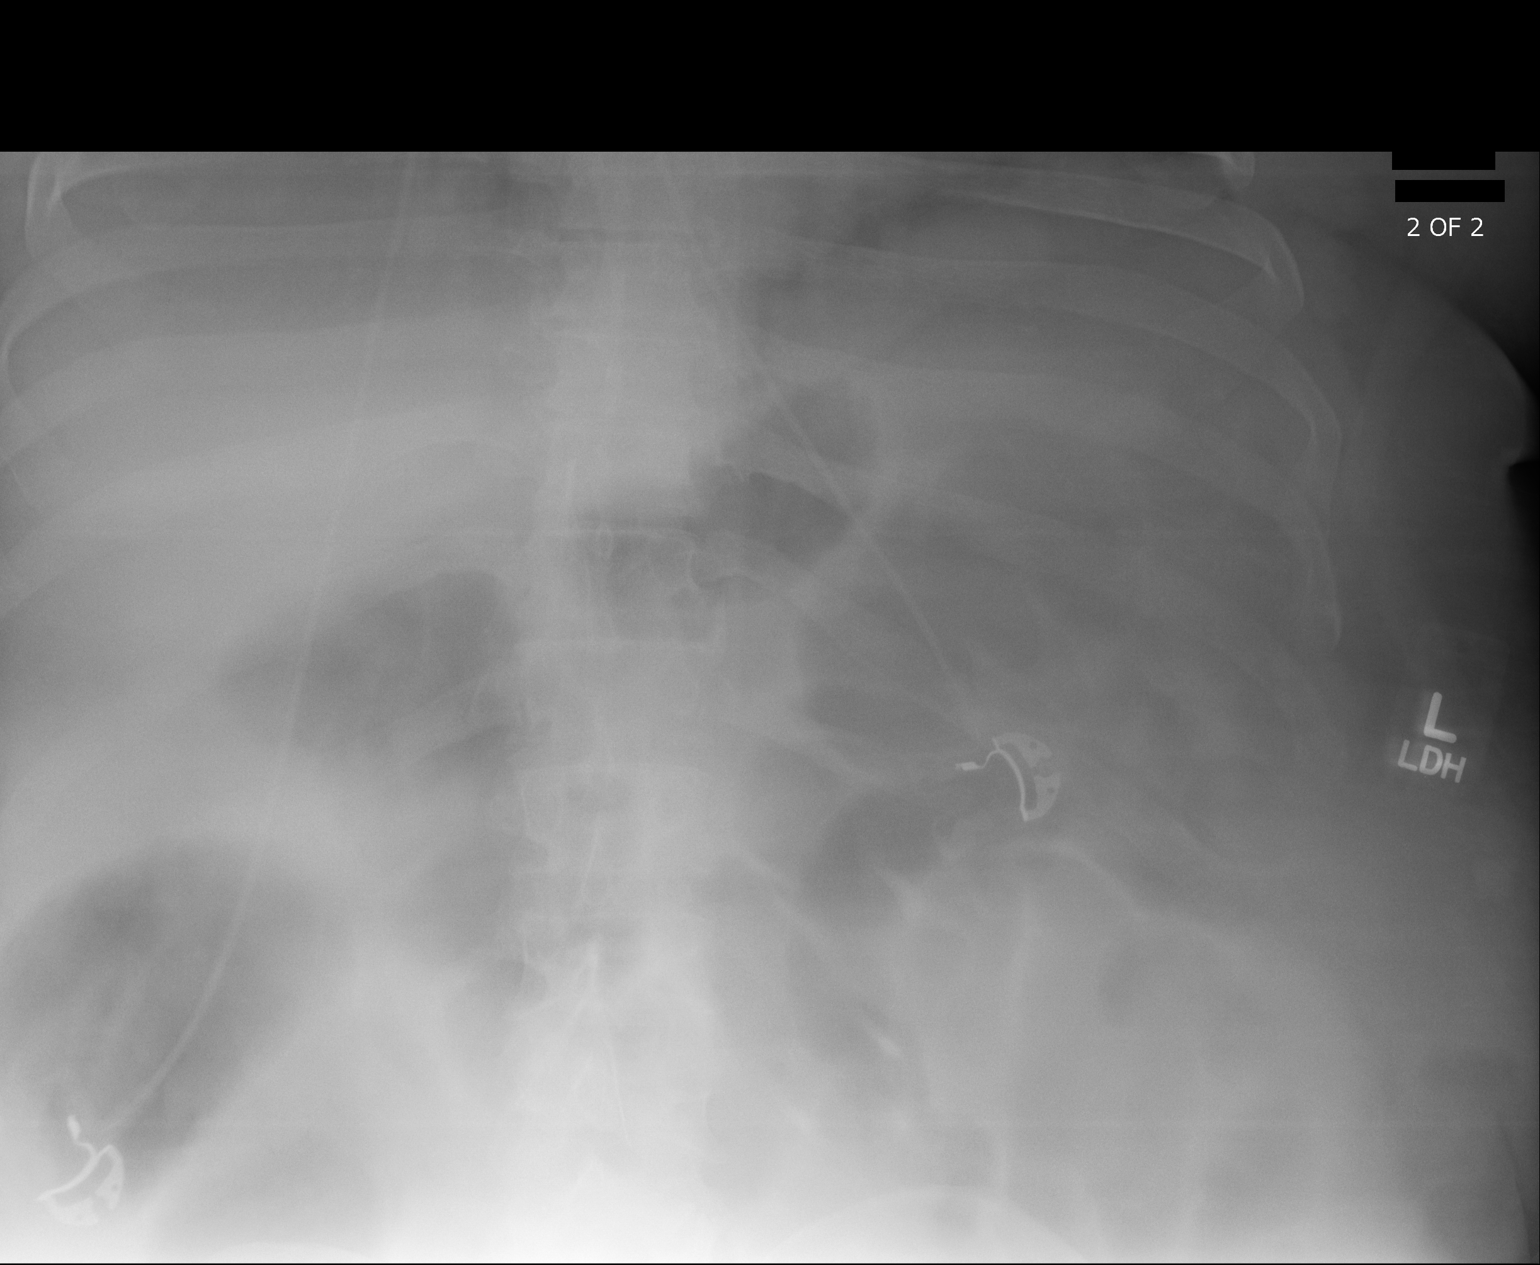

[2 of 2 positions shown; findings below may reference images not displayed]

FINDINGS: No definite central line/dialysis catheter is identified.

IVC filter projects over lumbar spine.

Air-filled loops of air-filled nondilated large and small bowel.

Bones demineralized.
IMPRESSION: No definite dialysis catheter is visualized.

## 2016-11-22 ENCOUNTER — Encounter: Payer: Self-pay | Admitting: *Deleted

## 2016-11-22 ENCOUNTER — Emergency Department: Payer: 59

## 2016-11-22 ENCOUNTER — Inpatient Hospital Stay
Admission: EM | Admit: 2016-11-22 | Discharge: 2016-11-24 | DRG: 175 | Disposition: A | Discharge status: Home / Self Care | Payer: 59 | Attending: Internal Medicine | Admitting: Internal Medicine

## 2016-11-22 DIAGNOSIS — R0603 Acute respiratory distress: Secondary | ICD-10-CM | POA: Diagnosis not present

## 2016-11-22 DIAGNOSIS — J9601 Acute respiratory failure with hypoxia: Secondary | ICD-10-CM | POA: Diagnosis not present

## 2016-11-22 DIAGNOSIS — N182 Chronic kidney disease, stage 2 (mild): Secondary | ICD-10-CM | POA: Diagnosis present

## 2016-11-22 DIAGNOSIS — E1122 Type 2 diabetes mellitus with diabetic chronic kidney disease: Secondary | ICD-10-CM | POA: Diagnosis present

## 2016-11-22 DIAGNOSIS — R000 Tachycardia, unspecified: Secondary | ICD-10-CM | POA: Diagnosis present

## 2016-11-22 DIAGNOSIS — I493 Ventricular premature depolarization: Secondary | ICD-10-CM | POA: Diagnosis present

## 2016-11-22 DIAGNOSIS — I82409 Acute embolism and thrombosis of unspecified deep veins of unspecified lower extremity: Secondary | ICD-10-CM | POA: Diagnosis not present

## 2016-11-22 DIAGNOSIS — E1151 Type 2 diabetes mellitus with diabetic peripheral angiopathy without gangrene: Secondary | ICD-10-CM | POA: Diagnosis present

## 2016-11-22 DIAGNOSIS — T45516A Underdosing of anticoagulants, initial encounter: Secondary | ICD-10-CM | POA: Diagnosis present

## 2016-11-22 DIAGNOSIS — Z95828 Presence of other vascular implants and grafts: Secondary | ICD-10-CM | POA: Diagnosis not present

## 2016-11-22 DIAGNOSIS — Z7901 Long term (current) use of anticoagulants: Secondary | ICD-10-CM | POA: Diagnosis not present

## 2016-11-22 DIAGNOSIS — Z6841 Body Mass Index (BMI) 40.0 and over, adult: Secondary | ICD-10-CM | POA: Diagnosis not present

## 2016-11-22 DIAGNOSIS — Z881 Allergy status to other antibiotic agents status: Secondary | ICD-10-CM | POA: Diagnosis not present

## 2016-11-22 DIAGNOSIS — R0602 Shortness of breath: Secondary | ICD-10-CM

## 2016-11-22 DIAGNOSIS — R0601 Orthopnea: Secondary | ICD-10-CM

## 2016-11-22 DIAGNOSIS — Z91041 Radiographic dye allergy status: Secondary | ICD-10-CM

## 2016-11-22 DIAGNOSIS — R008 Other abnormalities of heart beat: Secondary | ICD-10-CM | POA: Diagnosis present

## 2016-11-22 DIAGNOSIS — I10 Essential (primary) hypertension: Secondary | ICD-10-CM | POA: Diagnosis not present

## 2016-11-22 DIAGNOSIS — I82423 Acute embolism and thrombosis of iliac vein, bilateral: Secondary | ICD-10-CM | POA: Diagnosis not present

## 2016-11-22 DIAGNOSIS — Z9119 Patient's noncompliance with other medical treatment and regimen: Secondary | ICD-10-CM

## 2016-11-22 DIAGNOSIS — I5033 Acute on chronic diastolic (congestive) heart failure: Secondary | ICD-10-CM | POA: Diagnosis present

## 2016-11-22 DIAGNOSIS — E669 Obesity, unspecified: Secondary | ICD-10-CM

## 2016-11-22 DIAGNOSIS — Z86711 Personal history of pulmonary embolism: Secondary | ICD-10-CM

## 2016-11-22 DIAGNOSIS — I82503 Chronic embolism and thrombosis of unspecified deep veins of lower extremity, bilateral: Secondary | ICD-10-CM | POA: Diagnosis present

## 2016-11-22 DIAGNOSIS — I130 Hypertensive heart and chronic kidney disease with heart failure and stage 1 through stage 4 chronic kidney disease, or unspecified chronic kidney disease: Secondary | ICD-10-CM | POA: Diagnosis present

## 2016-11-22 DIAGNOSIS — I5031 Acute diastolic (congestive) heart failure: Secondary | ICD-10-CM | POA: Diagnosis not present

## 2016-11-22 DIAGNOSIS — Z79899 Other long term (current) drug therapy: Secondary | ICD-10-CM

## 2016-11-22 DIAGNOSIS — E7212 Methylenetetrahydrofolate reductase deficiency: Secondary | ICD-10-CM | POA: Diagnosis present

## 2016-11-22 DIAGNOSIS — D6859 Other primary thrombophilia: Secondary | ICD-10-CM | POA: Diagnosis present

## 2016-11-22 DIAGNOSIS — Z87448 Personal history of other diseases of urinary system: Secondary | ICD-10-CM | POA: Diagnosis not present

## 2016-11-22 DIAGNOSIS — I2699 Other pulmonary embolism without acute cor pulmonale: Principal | ICD-10-CM | POA: Diagnosis present

## 2016-11-22 DIAGNOSIS — R0609 Other forms of dyspnea: Secondary | ICD-10-CM | POA: Diagnosis not present

## 2016-11-22 DIAGNOSIS — R600 Localized edema: Secondary | ICD-10-CM

## 2016-11-22 DIAGNOSIS — I498 Other specified cardiac arrhythmias: Secondary | ICD-10-CM

## 2016-11-22 DIAGNOSIS — R0600 Dyspnea, unspecified: Secondary | ICD-10-CM | POA: Diagnosis not present

## 2016-11-22 DIAGNOSIS — I824Z9 Acute embolism and thrombosis of unspecified deep veins of unspecified distal lower extremity: Secondary | ICD-10-CM | POA: Diagnosis not present

## 2016-11-22 HISTORY — DX: Presence of other vascular implants and grafts: Z95.828

## 2016-11-22 HISTORY — DX: Acute embolism and thrombosis of unspecified deep veins of unspecified lower extremity: I82.409

## 2016-11-22 LAB — CBC
HEMATOCRIT: 42.9 % (ref 40.0–52.0)
Hemoglobin: 14.5 g/dL (ref 13.0–18.0)
MCH: 30.3 pg (ref 26.0–34.0)
MCHC: 33.8 g/dL (ref 32.0–36.0)
MCV: 89.5 fL (ref 80.0–100.0)
Platelets: 229 10*3/uL (ref 150–440)
RBC: 4.8 MIL/uL (ref 4.40–5.90)
RDW: 15.1 % — AB (ref 11.5–14.5)
WBC: 4 10*3/uL (ref 3.8–10.6)

## 2016-11-22 LAB — BASIC METABOLIC PANEL
Anion gap: 6 (ref 5–15)
BUN: 17 mg/dL (ref 6–20)
CO2: 23 mmol/L (ref 22–32)
Calcium: 8.7 mg/dL — ABNORMAL LOW (ref 8.9–10.3)
Chloride: 109 mmol/L (ref 101–111)
Creatinine, Ser: 1.44 mg/dL — ABNORMAL HIGH (ref 0.61–1.24)
GFR calc Af Amer: >60 mL/min (ref >60)
GFR, EST NON AFRICAN AMERICAN: 57 mL/min — AB (ref >60)
Glucose, Bld: 115 mg/dL — ABNORMAL HIGH (ref 65–99)
Potassium: 4.1 mmol/L (ref 3.5–5.1)
Sodium: 138 mmol/L (ref 135–145)

## 2016-11-22 LAB — TROPONIN I
TROPONIN I: 0.04 ng/mL — AB (ref <0.03)
Troponin I: 0.03 ng/mL (ref <0.03)

## 2016-11-22 LAB — GLUCOSE, CAPILLARY
GLUCOSE-CAPILLARY: 90 mg/dL (ref 65–99)
GLUCOSE-CAPILLARY: 97 mg/dL (ref 65–99)

## 2016-11-22 LAB — BRAIN NATRIURETIC PEPTIDE: B Natriuretic Peptide: 24 pg/mL (ref 0.0–100.0)

## 2016-11-22 MED ORDER — IPRATROPIUM-ALBUTEROL 0.5-2.5 (3) MG/3ML IN SOLN
3.0000 mL | Freq: Once | RESPIRATORY_TRACT | Status: AC
  Administered 2016-11-22: 3 mL via RESPIRATORY_TRACT
  Filled 2016-11-22: qty 3

## 2016-11-22 MED ORDER — ALBUTEROL SULFATE (2.5 MG/3ML) 0.083% IN NEBU: 2.5000 mg | INHALATION_SOLUTION | RESPIRATORY_TRACT | Status: DC | PRN

## 2016-11-22 MED ORDER — ONDANSETRON HCL 4 MG/2ML IJ SOLN: 4.0000 mg | Freq: Four times a day (QID) | INTRAMUSCULAR | Status: DC | PRN

## 2016-11-22 MED ORDER — SODIUM CHLORIDE 0.9 % IV SOLN: 250.0000 mL | INTRAVENOUS | Status: DC | PRN

## 2016-11-22 MED ORDER — APIXABAN 5 MG PO TABS
10.0000 mg | ORAL_TABLET | Freq: Two times a day (BID) | ORAL | Status: DC
  Administered 2016-11-22 – 2016-11-23 (×3): 10 mg via ORAL
  Filled 2016-11-22 (×3): qty 2

## 2016-11-22 MED ORDER — HYDROCHLOROTHIAZIDE 25 MG PO TABS
25.0000 mg | ORAL_TABLET | Freq: Every day | ORAL | Status: DC
  Administered 2016-11-22 – 2016-11-23 (×2): 25 mg via ORAL
  Filled 2016-11-22 (×2): qty 1

## 2016-11-22 MED ORDER — ACETAMINOPHEN 650 MG RE SUPP: 650.0000 mg | Freq: Four times a day (QID) | RECTAL | Status: DC | PRN

## 2016-11-22 MED ORDER — HYDRALAZINE HCL 20 MG/ML IJ SOLN
10.0000 mg | Freq: Four times a day (QID) | INTRAMUSCULAR | Status: DC | PRN
Start: 2016-11-22 — End: 2016-11-24

## 2016-11-22 MED ORDER — ONDANSETRON HCL 4 MG PO TABS: 4.0000 mg | ORAL_TABLET | Freq: Four times a day (QID) | ORAL | Status: DC | PRN

## 2016-11-22 MED ORDER — INSULIN ASPART 100 UNIT/ML ~~LOC~~ SOLN: 0.0000 [IU] | Freq: Every day | SUBCUTANEOUS | Status: DC

## 2016-11-22 MED ORDER — APIXABAN 5 MG PO TABS: 5.0000 mg | ORAL_TABLET | Freq: Two times a day (BID) | ORAL | Status: DC

## 2016-11-22 MED ORDER — CALCIUM ACETATE (PHOS BINDER) 667 MG PO CAPS
1334.0000 mg | ORAL_CAPSULE | Freq: Three times a day (TID) | ORAL | Status: DC
  Administered 2016-11-22 – 2016-11-23 (×3): 1334 mg via ORAL
  Filled 2016-11-22 (×3): qty 2

## 2016-11-22 MED ORDER — METOPROLOL TARTRATE 25 MG PO TABS
25.0000 mg | ORAL_TABLET | Freq: Two times a day (BID) | ORAL | Status: DC
  Administered 2016-11-22 – 2016-11-24 (×4): 25 mg via ORAL
  Filled 2016-11-22 (×5): qty 1

## 2016-11-22 MED ORDER — HYDROCODONE-ACETAMINOPHEN 5-325 MG PO TABS: 1.0000 | ORAL_TABLET | ORAL | Status: DC | PRN

## 2016-11-22 MED ORDER — SODIUM CHLORIDE 0.9% FLUSH
3.0000 mL | Freq: Two times a day (BID) | INTRAVENOUS | Status: DC
  Administered 2016-11-22 – 2016-11-24 (×4): 3 mL via INTRAVENOUS

## 2016-11-22 MED ORDER — INSULIN ASPART 100 UNIT/ML ~~LOC~~ SOLN
0.0000 [IU] | Freq: Three times a day (TID) | SUBCUTANEOUS | Status: DC
  Administered 2016-11-24: 1 [IU] via SUBCUTANEOUS
  Filled 2016-11-22: qty 1

## 2016-11-22 MED ORDER — DOCUSATE SODIUM 100 MG PO CAPS: 100.0000 mg | ORAL_CAPSULE | Freq: Two times a day (BID) | ORAL | Status: DC | PRN

## 2016-11-22 MED ORDER — KETOROLAC TROMETHAMINE 30 MG/ML IJ SOLN: 30.0000 mg | Freq: Four times a day (QID) | INTRAMUSCULAR | Status: DC | PRN

## 2016-11-22 MED ORDER — SODIUM CHLORIDE 0.9% FLUSH
3.0000 mL | INTRAVENOUS | Status: DC | PRN
Start: 2016-11-22 — End: 2016-11-24

## 2016-11-22 MED ORDER — ACETAMINOPHEN 325 MG PO TABS: 650.0000 mg | ORAL_TABLET | Freq: Four times a day (QID) | ORAL | Status: DC | PRN

## 2016-11-22 MED ORDER — FUROSEMIDE 10 MG/ML IJ SOLN
60.0000 mg | Freq: Once | INTRAMUSCULAR | Status: AC
  Administered 2016-11-22: 60 mg via INTRAVENOUS
  Filled 2016-11-22: qty 8

## 2016-11-22 MED ORDER — SIMETHICONE 80 MG PO CHEW
80.0000 mg | CHEWABLE_TABLET | Freq: Four times a day (QID) | ORAL | Status: DC | PRN
  Filled 2016-11-22: qty 1

## 2016-11-22 NOTE — ED Notes (Signed)
Dr Roxan Hockeyrobinson aware of sats dropping

## 2016-11-22 NOTE — ED Notes (Signed)
Pt informed will be admitted. No needs at this time. Urinal remains at bedside. Family at bedside.

## 2016-11-22 NOTE — ED Notes (Signed)
Ambulates in hallway around nurses station, SAT 98 room air with ambulation, patient noted to have slight breathlessness.

## 2016-11-22 NOTE — ED Triage Notes (Addendum)
Pt arrives via EMS, pt states SOB since last night, states he slept in recliner last night due to SOB, states hx of DVT, EMS reports pt 91% on RA, states o2 sat went to 95% on 3L [place], pt on eliquis but states he has missed some doses

## 2016-11-22 NOTE — ED Notes (Signed)
Called to check on troponin and bnp. Lab reports will run now.

## 2016-11-22 NOTE — H&P (Signed)
[facility] [place] at [facility] : [facility]   PATIENT NAME: Ryan [name]    MR#:  [id number]  DATE OF BIRTH:  Nov 07, 1970  DATE OF ADMISSION:  11/22/2016  PRIMARY CARE PHYSICIAN: Sherrie MustacheFayegh Jadali   REQUESTING/REFERRING PHYSICIAN: Willy EddyPatrick Robinson, MD  CHIEF COMPLAINT:   Chief Complaint  Patient presents with  . Shortness of Breath   Cough and shortness breath for the past few days. HISTORY OF PRESENT ILLNESS:  Ryan [name]  is a 46 y.o. male with a known history of PE and DVT, PVD, hypertension and diabetes. The patient presents to the ED with above chief complaints. He was on vacation and didn't take Eliquis for 3 days. He has had shortness of breath for a few days, which has been worsening this morning. She he denies any chest pain, palpitation or hemoptysis. His oxygen level decreased to 85% in ED. ED physician discussed with Dr. Sung AmabileSimonds, who suggested possible pulmonary hypertension and get a echocardiograph. The patient was treated with Eliquis and Lasix in the ED.  PAST MEDICAL HISTORY:   Past Medical History:  Diagnosis Date  . Diabetes mellitus without complication (HCC)   . DVT (deep venous thrombosis) (HCC)   . Hypertension   . Peripheral vascular disease (HCC)   . Presence of IVC filter     PAST SURGICAL HISTORY:   Past Surgical History:  Procedure Laterality Date  . PERIPHERAL VASCULAR CATHETERIZATION N/A 01/10/2015   Procedure: Dialysis/Perma Catheter Insertion;  Surgeon: Renford DillsGregory G Schnier, MD;  Location: [facility] INVASIVE CV LAB;  Service: Cardiovascular;  Laterality: N/A;  . PERIPHERAL VASCULAR CATHETERIZATION N/A 02/24/2015   Procedure: Dialysis/Perma Catheter Removal;  Surgeon: Annice NeedyJason S Dew, MD;  Location: [facility] INVASIVE CV LAB;  Service: Cardiovascular;  Laterality: N/A;    SOCIAL HISTORY:   Social History  Substance Use Topics  . Smoking status: Never Smoker  . Smokeless tobacco: Never Used  . Alcohol use No    FAMILY HISTORY:  No family history  on file.  DRUG ALLERGIES:   Allergies  Allergen Reactions  . Ivp Dye [Iodinated Diagnostic Agents]     Kidney failure requiring dialysis  . Clindamycin/Lincomycin Rash    REVIEW OF SYSTEMS:   Review of Systems  Constitutional: Negative for chills, fever and malaise/fatigue.  HENT: Negative for congestion and nosebleeds.   Eyes: Negative for blurred vision and double vision.  Respiratory: Positive for cough and shortness of breath. Negative for hemoptysis, sputum production, wheezing and stridor.   Cardiovascular: Negative for chest pain, palpitations and leg swelling.  Gastrointestinal: Negative for abdominal pain, blood in stool, constipation, diarrhea, melena, nausea and vomiting.  Genitourinary: Negative for dysuria and hematuria.  Musculoskeletal: Negative for back pain.  Skin: Negative for itching and rash.  Neurological: Negative for dizziness, focal weakness, loss of consciousness, weakness and headaches.  Psychiatric/Behavioral: Negative for depression. The patient is not nervous/anxious.     MEDICATIONS AT HOME:   Prior to Admission medications   Medication Sig Start Date End Date Taking? Authorizing Provider  apixaban (ELIQUIS) 2.5 MG TABS tablet Take 1 tablet (2.5 mg total) by mouth 2 (two) times daily. 01/15/15  Yes Katha HammingSnehalatha Konidena, MD  hydrochlorothiazide (HYDRODIURIL) 25 MG tablet Take 25 mg by mouth daily.   Yes Historical Provider, MD  lisinopril (PRINIVIL,ZESTRIL) 10 MG tablet Take 10 mg by mouth daily.   Yes Historical Provider, MD  Vitamin D, Ergocalciferol, (DRISDOL) 50000 units CAPS capsule Take 50,000 Units by mouth every 7 (seven) days.   Yes Historical  Provider, MD  ALPRAZolam Prudy Feeler) 0.25 MG tablet Take 1 tablet (0.25 mg total) by mouth every 8 (eight) hours as needed for anxiety. Patient not taking: Reported on 02/24/2015 01/15/15   Katha Hamming, MD  calcium acetate (PHOSLO) 667 MG capsule Take 2 capsules (1,334 mg total) by mouth 3 (three) times  daily with meals. 01/15/15   Katha Hamming, MD  docusate sodium (COLACE) 100 MG capsule Take 100 mg by mouth 2 (two) times daily as needed for mild constipation.    Historical Provider, MD  metoprolol tartrate (LOPRESSOR) 25 MG tablet Take 25 mg by mouth 2 (two) times daily.    Historical Provider, MD  simethicone (MYLICON) 80 MG chewable tablet Chew 80 mg by mouth 4 (four) times daily as needed for flatulence.    Historical Provider, MD      VITAL SIGNS:  Blood pressure (!) 169/114, pulse 78, temperature 98.7 F (37.1 C), temperature source Oral, resp. rate 18, height 5\' 7"  (1.702 m), weight (!) 360 lb (163.3 kg), SpO2 92 %.  PHYSICAL EXAMINATION:  Physical Exam  GENERAL:  46 y.o.-year-old patient lying in the bed with no acute distress. Morbidly obese. EYES: Pupils equal, round, reactive to light and accommodation. No scleral icterus. Extraocular muscles intact.  HEENT: Head atraumatic, normocephalic. Oropharynx and nasopharynx clear.  NECK:  Supple, no jugular venous distention. No thyroid enlargement, no tenderness.  LUNGS: Normal breath sounds bilaterally, no wheezing, rales,rhonchi or crepitation. No use of accessory muscles of respiration.  CARDIOVASCULAR: S1, S2 normal. No murmurs, rubs, or gallops.  ABDOMEN: Soft, nontender, nondistended. Bowel sounds present. No organomegaly or mass.  EXTREMITIES: No pedal edema, cyanosis, or clubbing. Bilateral chronic dark skin changes. NEUROLOGIC: Cranial nerves II through XII are intact. Muscle strength 5/5 in all extremities. Sensation intact. Gait not checked.  PSYCHIATRIC: The patient is alert and oriented x 3.  SKIN: No obvious rash, lesion, or ulcer.   LABORATORY PANEL:   CBC  Recent Labs Lab 11/22/16 0937  WBC 4.0  HGB 14.5  HCT 42.9  PLT 229   ------------------------------------------------------------------------------------------------------------------  Chemistries   Recent Labs Lab 11/22/16 0937  NA 138  K  4.1  CL 109  CO2 23  GLUCOSE 115*  BUN 17  CREATININE 1.44*  CALCIUM 8.7*   ------------------------------------------------------------------------------------------------------------------  Cardiac Enzymes  Recent Labs Lab 11/22/16 1223  TROPONINI 0.03*   ------------------------------------------------------------------------------------------------------------------  RADIOLOGY:  Dg Chest 2 View  Result Date: 11/22/2016 CLINICAL DATA:  Shortness of breath. EXAM: CHEST  2 VIEW COMPARISON:  12/20/2014. FINDINGS: Mediastinum and hilar structures are normal. Lungs are clear. Heart size normal. No pleural effusion or pneumothorax. No acute bony abnormality identified. IVC filter noted. IMPRESSION: No acute cardiopulmonary disease. Electronically Signed   By: Maisie Fus  Register   On: 11/22/2016 10:18      IMPRESSION AND PLAN:   Acute respiratory failure with hypoxia The patient will be admitted to medical floor. Continue oxygen by nasal cannular, DuoNeb when necessary. Possible pulmonary hypertension. Follow-up echocardiogram and pulmonary consult, Dr. Park Breed.  Accelerated hypertension. Start HCTZ and Lopressor, add IV hydralazine when necessary. History of PE and DVT. Resume Eliquis. Follow-up venous ultrasound.  History of acute renal failure due to IV contrast. CKD stage 2.  Morbid obesity.  All the records are reviewed and case discussed with ED provider. Management plans discussed with the patient, family and they are in agreement.  CODE STATUS: Full code  TOTAL TIME TAKING CARE OF THIS PATIENT: 55 minutes.    Shaune Pollack M.D  on 11/22/2016 at 4:03 PM  Between 7am to 6pm - Pager - [phone]  After 6pm go to [email or web address] - password [email or web address] [facility]  [facility] [place] [facility]  Office  [phone]  CC: Primary care physician; Sherrie Mustache   Note: This dictation was prepared with Dragon dictation along with smaller phrase technology. Any  transcriptional errors that result from this process are unintentional.

## 2016-11-22 NOTE — ED Notes (Signed)
Patient transported to Ultrasound 

## 2016-11-22 NOTE — ED Notes (Signed)
Will send pt to floor after [place]

## 2016-11-22 NOTE — ED Provider Notes (Signed)
[facility] Emergency Department Provider Note    First MD Initiated Contact with Patient 11/22/16 [phone]     (approximate)  I have reviewed the triage vital signs and the nursing notes.   HISTORY  Chief Complaint Shortness of Breath    HPI Ryan SilversmithCharles A [name] is a 46 y.o. male with history of diabetes as well as PE status post IVC filter on L [place] presents with weakness and shortness of breath that initially started 5-6 days ago became acutely worse this morning. Patient denies any chest pain. States that he is feeling short of breath in the low 80s catch his breath was to be sleeping in his recliner. Gets severely short of breath with laying flat. States that he did recently travel to the beach and missed 2 doses of his request but has been back on it for the past 4 days.   Past Medical History:  Diagnosis Date  . Diabetes mellitus without complication (HCC)   . DVT (deep venous thrombosis) (HCC)   . Hypertension   . Peripheral vascular disease (HCC)   . Presence of IVC filter    No family history on file. Past Surgical History:  Procedure Laterality Date  . PERIPHERAL VASCULAR CATHETERIZATION N/A 01/10/2015   Procedure: Dialysis/Perma Catheter Insertion;  Surgeon: Renford DillsGregory G Schnier, MD;  Location: [facility] INVASIVE CV LAB;  Service: Cardiovascular;  Laterality: N/A;  . PERIPHERAL VASCULAR CATHETERIZATION N/A 02/24/2015   Procedure: Dialysis/Perma Catheter Removal;  Surgeon: Annice NeedyJason S Dew, MD;  Location: [facility] INVASIVE CV LAB;  Service: Cardiovascular;  Laterality: N/A;   Patient Active Problem List   Diagnosis Date Noted  . Acute DVT (deep venous thrombosis) (HCC) 12/28/2014      Prior to Admission medications   Medication Sig Start Date End Date Taking? Authorizing Provider  ALPRAZolam (XANAX) 0.25 MG tablet Take 1 tablet (0.25 mg total) by mouth every 8 (eight) hours as needed for anxiety. Patient not taking: Reported on 02/24/2015 01/15/15   Katha HammingSnehalatha  Konidena, MD  apixaban (ELIQUIS) 2.5 MG TABS tablet Take 1 tablet (2.5 mg total) by mouth 2 (two) times daily. 01/15/15   Katha HammingSnehalatha Konidena, MD  calcium acetate (PHOSLO) 667 MG capsule Take 2 capsules (1,334 mg total) by mouth 3 (three) times daily with meals. 01/15/15   Katha HammingSnehalatha Konidena, MD  docusate sodium (COLACE) 100 MG capsule Take 100 mg by mouth 2 (two) times daily as needed for mild constipation.    Historical Provider, MD  docusate sodium (COLACE) 100 MG capsule Take 1 capsule (100 mg total) by mouth 2 (two) times daily as needed (for stool softener). Patient not taking: Reported on 02/24/2015 01/15/15   Katha HammingSnehalatha Konidena, MD  insulin aspart (NOVOLOG) 100 UNIT/ML injection Inject 0-10 Units into the skin 4 (four) times daily -  before meals and at bedtime. Patient not taking: Reported on 02/24/2015 01/15/15   Katha HammingSnehalatha Konidena, MD  metoprolol tartrate (LOPRESSOR) 25 MG tablet Take 25 mg by mouth 2 (two) times daily.    Historical Provider, MD  simethicone (MYLICON) 80 MG chewable tablet Chew 80 mg by mouth 4 (four) times daily as needed for flatulence.    Historical Provider, MD  traMADol (ULTRAM) 50 MG tablet Take by mouth 3 (three) times daily as needed for moderate pain.    Historical Provider, MD    Allergies Ivp dye [iodinated diagnostic agents] and Clindamycin/lincomycin    Social History Social History  Substance Use Topics  . Smoking status: Never Smoker  . Smokeless  tobacco: Never Used  . Alcohol use No    Review of Systems Patient denies headaches, rhinorrhea, blurry vision, numbness, shortness of breath, chest pain, edema, cough, abdominal pain, nausea, vomiting, diarrhea, dysuria, fevers, rashes or hallucinations unless otherwise stated above in HPI. ____________________________________________   PHYSICAL EXAM:  VITAL SIGNS: Vitals:   11/22/16 0936 11/22/16 0955  BP: (!) 172/98 (!) 178/105  Pulse: 87   Resp: 20 (!) 21  Temp: 98.7 F (37.1 C)      Constitutional: Alert and oriented. Well appearing and in no acute distress. Eyes: Conjunctivae are normal. PERRL. EOMI. Head: Atraumatic. Nose: No congestion/rhinnorhea. Mouth/Throat: Mucous membranes are moist.  Oropharynx non-erythematous. Neck: No stridor. Painless ROM. No cervical spine tenderness to palpation Hematological/Lymphatic/Immunilogical: No cervical lymphadenopathy. Cardiovascular: Normal rate, regular rhythm. Grossly normal heart sounds.  Good peripheral circulation. Respiratory: Normal respiratory effort.  No retractions. Lungs CTAB. Gastrointestinal: Soft and nontender. No distention. No abdominal bruits. No CVA tenderness. Genitourinary:  Musculoskeletal: No lower extremity tenderness nor edema.  No joint effusions. Neurologic:  Normal speech and language. No gross focal neurologic deficits are appreciated. No gait instability. Skin:  Skin is warm, dry and intact. No rash noted. Psychiatric: Mood and affect are normal. Speech and behavior are normal.  ____________________________________________   LABS (all labs ordered are listed, but only abnormal results are displayed)  Results for orders placed or performed during the hospital encounter of 11/22/16 (from the past 24 hour(s))  Basic metabolic panel     Status: Abnormal   Collection Time: 11/22/16  9:37 AM  Result Value Ref Range   Sodium 138 135 - 145 mmol/L   Potassium 4.1 3.5 - 5.1 mmol/L   Chloride 109 101 - 111 mmol/L   CO2 23 22 - 32 mmol/L   Glucose, Bld 115 (H) 65 - 99 mg/dL   BUN 17 6 - 20 mg/dL   Creatinine, Ser 4.09 (H) 0.61 - 1.24 mg/dL   Calcium 8.7 (L) 8.9 - 10.3 mg/dL   GFR calc non Af Amer 57 (L) >60 mL/min   GFR calc Af Amer >60 >60 mL/min   Anion gap 6 5 - 15  CBC     Status: Abnormal   Collection Time: 11/22/16  9:37 AM  Result Value Ref Range   WBC 4.0 3.8 - 10.6 K/uL   RBC 4.80 4.40 - 5.90 MIL/uL   Hemoglobin 14.5 13.0 - 18.0 g/dL   HCT 81.1 91.4 - 78.2 %   MCV 89.5 80.0 -  100.0 fL   MCH 30.3 26.0 - 34.0 pg   MCHC 33.8 32.0 - 36.0 g/dL   RDW 95.6 (H) 21.3 - 08.6 %   Platelets 229 150 - 440 K/uL   ____________________________________________  EKG My review and personal interpretation at Time:  9:47    Indication: orthopnea  Rate: 80  Rhythm: sinus Axis: normal Other: no stemi, no st depressions, normal intervals ____________________________________________  RADIOLOGY  I personally reviewed all radiographic images ordered to evaluate for the above acute complaints and reviewed radiology reports and findings.  These findings were personally discussed with the patient.  Please see medical record for radiology report.  ____________________________________________   PROCEDURES  Procedure(s) performed:  Procedures    Critical Care performed: no ____________________________________________   INITIAL IMPRESSION / ASSESSMENT AND PLAN / ED COURSE  Pertinent labs & imaging results that were available during my care of the patient were reviewed by me and considered in my medical decision making (see chart for details).  DDX: Asthma, copd, CHF, pna, ptx, malignancy, Pe, anemia   JLON BETKER is a 46 y.o. who presents to the ED with history of DVT as well as IV silt filter on L) recently been noncompliant with this medication. He arrives afebrile and hemodynamically stable in no acute distress. Differential includes congestive heart failure, pneumonia, COPD or pulmonary embolism. PE was considered but he does not have any chest pain which is typically had in the past. States that he's been on was the past 4 days but it was not a therapeutic dose after missing several treatments. Given his orthopnea I am concerned for heart failure however his chest x-ray appears clear without any significant edema. His EKG shows no evidence of acute ischemia. His troponin is mildly elevated but again denies any chest pain.  The patient will be placed on continuous pulse  oximetry and telemetry for monitoring.  Laboratory evaluation will be sent to evaluate for the above complaints.     Clinical Course as of Nov 23 1455  Mon Nov 22, 2016  1419 Patient had repeat troponin which was unchanged from previous. He in PE without any evidence of significant elevation. Patient was given a dose of Lasix for trial efficacy given his report of orthopnea. He was able to ambulate with a steady gait without any evidence of hypoxia. Patient is able to lay flat without any significant hypoxia or worsening shortness of breath. May have underlying sleep apnea. We'll provide referral for cardiology. Patient has restarted his eliquis therefore do not believe repeat ct imaging clinically indicated.  [PR]  1431 Plan trial of patient laying supine and patient desaturated down to 84% significant shortness of breath. At this point do feel patient will need admission for further workup of his hypoxia. May be secondary to treatment failure with Corky Mull for possible PE. Possible underlying congestive heart failure but would certainly benefit from echo and further evaluation.  [PR]  1436 I spoke with Dr. Darrol Angel and discussed the patient's presentation and case. He agrees with need for admission for echo and lower extremity Doppler and reinitiating the patient's Eliquist dose.  [PR]    Clinical Course User Index [PR] Willy Eddy, MD     ____________________________________________   FINAL CLINICAL IMPRESSION(S) / ED DIAGNOSES  Final diagnoses:  SOB (shortness of breath)  Acute respiratory failure with hypoxia (HCC)  Orthopnea      NEW MEDICATIONS STARTED DURING THIS VISIT:  New Prescriptions   No medications on file     Note:  This document was prepared using Dragon voice recognition software and may include unintentional dictation errors.    Willy Eddy, MD 11/22/16 [phone]

## 2016-11-22 NOTE — Progress Notes (Signed)
Dr. Imogene Burnhen notified that US results are back.

## 2016-11-22 NOTE — ED Notes (Signed)
Date and time results received: 11/22/16 1153 (use smartphrase ".now" to insert current time)  Test: troponin Critical Value: 0.04  Name of Provider Notified: Roxan Hockeyrobinson

## 2016-11-23 ENCOUNTER — Inpatient Hospital Stay (HOSPITAL_COMMUNITY)
Admit: 2016-11-23 | Discharge: 2016-11-23 | Disposition: A | Discharge status: Home / Self Care | Payer: 59 | Attending: Internal Medicine | Admitting: Internal Medicine

## 2016-11-23 DIAGNOSIS — I82409 Acute embolism and thrombosis of unspecified deep veins of unspecified lower extremity: Secondary | ICD-10-CM

## 2016-11-23 DIAGNOSIS — I10 Essential (primary) hypertension: Secondary | ICD-10-CM

## 2016-11-23 DIAGNOSIS — J9601 Acute respiratory failure with hypoxia: Secondary | ICD-10-CM

## 2016-11-23 DIAGNOSIS — R0600 Dyspnea, unspecified: Secondary | ICD-10-CM

## 2016-11-23 DIAGNOSIS — I82423 Acute embolism and thrombosis of iliac vein, bilateral: Secondary | ICD-10-CM

## 2016-11-23 DIAGNOSIS — R0609 Other forms of dyspnea: Secondary | ICD-10-CM

## 2016-11-23 DIAGNOSIS — I2699 Other pulmonary embolism without acute cor pulmonale: Principal | ICD-10-CM

## 2016-11-23 DIAGNOSIS — R0602 Shortness of breath: Secondary | ICD-10-CM

## 2016-11-23 LAB — BASIC METABOLIC PANEL
Anion gap: 8 (ref 5–15)
BUN: 18 mg/dL (ref 6–20)
CALCIUM: 9.2 mg/dL (ref 8.9–10.3)
CHLORIDE: 105 mmol/L (ref 101–111)
CO2: 26 mmol/L (ref 22–32)
CREATININE: 1.53 mg/dL — AB (ref 0.61–1.24)
GFR calc non Af Amer: 53 mL/min — ABNORMAL LOW (ref >60)
GLUCOSE: 101 mg/dL — AB (ref 65–99)
Potassium: 4.1 mmol/L (ref 3.5–5.1)
Sodium: 139 mmol/L (ref 135–145)

## 2016-11-23 LAB — CBC
HCT: 43.7 % (ref 40.0–52.0)
Hemoglobin: 14.6 g/dL (ref 13.0–18.0)
MCH: 30.4 pg (ref 26.0–34.0)
MCHC: 33.5 g/dL (ref 32.0–36.0)
MCV: 90.8 fL (ref 80.0–100.0)
PLATELETS: 242 10*3/uL (ref 150–440)
RBC: 4.81 MIL/uL (ref 4.40–5.90)
RDW: 14.9 % — AB (ref 11.5–14.5)
WBC: 5.2 10*3/uL (ref 3.8–10.6)

## 2016-11-23 LAB — GLUCOSE, CAPILLARY
GLUCOSE-CAPILLARY: 96 mg/dL (ref 65–99)
GLUCOSE-CAPILLARY: 104 mg/dL — AB (ref 65–99)
Glucose-Capillary: 111 mg/dL — ABNORMAL HIGH (ref 65–99)
Glucose-Capillary: 106 mg/dL — ABNORMAL HIGH (ref 65–99)

## 2016-11-23 LAB — HEMOGLOBIN A1C
HEMOGLOBIN A1C: 6.4 % — AB (ref 4.8–5.6)
Mean Plasma Glucose: 137 mg/dL

## 2016-11-23 LAB — ECHOCARDIOGRAM COMPLETE
HEIGHTINCHES: 67 in
Weight: 5760 oz

## 2016-11-23 LAB — HIV ANTIBODY (ROUTINE TESTING W REFLEX): HIV SCREEN 4TH GENERATION: NONREACTIVE

## 2016-11-23 MED ORDER — ENOXAPARIN SODIUM 150 MG/ML ~~LOC~~ SOLN
1.0000 mg/kg | Freq: Two times a day (BID) | SUBCUTANEOUS | Status: DC
  Administered 2016-11-23 – 2016-11-24 (×2): 165 mg via SUBCUTANEOUS
  Filled 2016-11-23 (×3): qty 1.09

## 2016-11-23 NOTE — Consult Note (Signed)
[facility] Consultation      Assessment  Bilateral lower extremity DVT, acute versus subacute. Hypercoagulable state, likely secondary to obesity, and MTHFR gene mutation per history. Poor compliance with therapy. Suspect PE based on clinical symptoms.  Plan:  -Continue Eliquis. -We'll consult vascular surgery to see if the patient is a candidate for thrombectomy given extensive bilateral DVTs.    Date: 11/23/2016  MRN# [id number] Ryan [name] [birth year]  Referring Physician:   Thea SilversmithCharles A [name] is a 46 y.o. old male seen in consultation for chief complaint of:    Chief Complaint  Patient presents with  . Shortness of Breath    HPI:   Patient is a 46 year old morbidly obese male with a history of DVT, PE, status post IVC filter. He also has a history of hypertension, diabetes mellitus. The patient has a long history of these problems, he was initially diagnosed with DVT at the age of 46. Since that time he has been on Coumadin. He is a history of noncompliance with his regimen. Per the patient's wife. He has a history of MTHFR mutation. Patient presents to the hospital now because of dyspnea. He has a chronic kidney disease. Therefore, CT of the chest with contrast could not be performed. [place], he had not taken his Eliquis during that time. He returned approximately week ago, 3 days ago he started noticing he was having increasing shortness of breath that prompted a visit to the ED. The patient underwent bilateral lower extremity Dopplers, which showed diffuse bilateral DVTs, unable to determine whether these are acute versus subacute. Currently, the patient's symptoms are doing much better.  --In 11/30/2014 [facility] with dyspnea, presyncope, hematuria. Coumadin was stopped at that time. Discharge 12/21/14 --12/24/14. Patient readmitted with right lower extremity DVT; Hypercoagulable workup  including factor V Leiden, factor mutation, anticardiolipin antibody, beta-2 glycoprotein 1 panel, Protein C and S panel unremarkable. He underwent from bilateral lower extremity thrombectomy and bilateral angioplasty the right and left common iliac veins as well as placement of a right IVC filter via right jugular approach. This course was complicated by renal disease necessitating hemodialysis. He was discharged on 01/15/15 with Eliquis.     PMHX:   Past Medical History:  Diagnosis Date  . Diabetes mellitus without complication (HCC)   . DVT (deep venous thrombosis) (HCC)   . Hypertension   . Peripheral vascular disease (HCC)   . Presence of IVC filter    Surgical Hx:  Past Surgical History:  Procedure Laterality Date  . PERIPHERAL VASCULAR CATHETERIZATION N/A 01/10/2015   Procedure: Dialysis/Perma Catheter Insertion;  Surgeon: Renford DillsGregory G Schnier, MD;  Location: [facility] INVASIVE CV LAB;  Service: Cardiovascular;  Laterality: N/A;  . PERIPHERAL VASCULAR CATHETERIZATION N/A 02/24/2015   Procedure: Dialysis/Perma Catheter Removal;  Surgeon: Annice NeedyJason S Dew, MD;  Location: [facility] INVASIVE CV LAB;  Service: Cardiovascular;  Laterality: N/A;   Family Hx:  History reviewed. No pertinent family history. Social Hx:   Social History  Substance Use Topics  . Smoking status: Never Smoker  . Smokeless tobacco: Never Used  . Alcohol use No   Medication:   Reviewed  Allergies:  Ivp dye [iodinated diagnostic agents] and Clindamycin/lincomycin  Review of Systems: Gen:  Denies  fever, sweats, chills HEENT: Denies blurred vision, double vision. bleeds, sore throat Cvc:  No dizziness, chest pain. Resp:   Denies cough or sputum production,  Gi: Denies swallowing difficulty,  stomach pain. Gu:  Denies bladder incontinence, burning urine Ext:   No Joint pain, stiffness. Skin: No skin rash,  hives  Endoc:  No polyuria, polydipsia. Psych: No depression, insomnia. Other:  All other systems were reviewed  with the patient and were negative other that what is mentioned in the HPI.   Physical Examination:   VS: BP (!) 154/97 (BP Location: Left Arm)   Pulse 79   Temp 98.2 F (36.8 C)   Resp 18   Ht 5\' 7"  (1.702 m)   Wt (!) 360 lb (163.3 kg)   SpO2 92%   BMI 56.38 kg/m   General Appearance: No distress Obese  Neuro:without focal findings,  speech normal,  HEENT: PERRLA, EOM intact.   Pulmonary: normal breath sounds, No wheezing.  CardiovascularNormal S1,S2.  No m/r/g.   Abdomen: Benign, Soft, non-tender. Renal:  No costovertebral tenderness  GU:  No performed at this time. Endoc: No evident thyromegaly, no signs of acromegaly. Skin:   warm, no rashes, no ecchymosis  Extremities: normal, no cyanosis,3+ bilateral lower extremity edema.  Other findings:    LABORATORY PANEL:   CBC  Recent Labs Lab 11/23/16 0421  WBC 5.2  HGB 14.6  HCT 43.7  PLT 242   ------------------------------------------------------------------------------------------------------------------  Chemistries   Recent Labs Lab 11/23/16 0421  NA 139  K 4.1  CL 105  CO2 26  GLUCOSE 101*  BUN 18  CREATININE 1.53*  CALCIUM 9.2   ------------------------------------------------------------------------------------------------------------------  Cardiac Enzymes  Recent Labs Lab 11/22/16 1223  TROPONINI 0.03*   ------------------------------------------------------------  RADIOLOGY:  Dg Chest 2 View  Result Date: 11/22/2016 CLINICAL DATA:  Shortness of breath. EXAM: CHEST  2 VIEW COMPARISON:  12/20/2014. FINDINGS: Mediastinum and hilar structures are normal. Lungs are clear. Heart size normal. No pleural effusion or pneumothorax. No acute bony abnormality identified. IVC filter noted. IMPRESSION: No acute cardiopulmonary disease. Electronically Signed   By: Maisie Fus  Register   On: 11/22/2016 10:18   US Venous Img Lower Bilateral  Result Date: 11/22/2016 CLINICAL DATA:  Lower extremity swelling  EXAM: BILATERAL LOWER EXTREMITY VENOUS DUPLEX ULTRASOUND TECHNIQUE: Doppler venous assessment of the left lower extremity deep venous system was performed, including characterization of spectral flow, compressibility, and phasicity. COMPARISON:  07/13/2005 FINDINGS: The right common femoral vein is compressible. Doppler analysis demonstrates phasicity. The femoral vein throughout its course is partially compressible and contains partially occlusive DVT. There is similar partially occlusive DVT in the popliteal vein. There is partially occlusive thrombus throughout the left common femoral, femoral, and popliteal veins. These vessels are partially compressible. Doppler analysis demonstrates phasicity. Augmentation was deferred due to DVT. IMPRESSION: There is extensive partially occlusive DVT throughout the lower extremities as described above. The thrombus may be chronic or subacute. It is difficult to exclude acute DVT. Electronically Signed   By: Jolaine Click M.D.   On: 11/22/2016 16:51       Thank  you for the consultation and for allowing [facility] to assist in the care of your patient. Our recommendations are noted above.  Please contact us if we can be of further service.   Wells Guiles, MD.  Board Certified in Internal Medicine, Pulmonary Medicine, Critical Care Medicine, and Sleep Medicine.  [facility] Office Number: [phone]  Santiago Glad, M.D.  Billy Fischer, M.D  11/23/2016

## 2016-11-23 NOTE — Progress Notes (Signed)
*  PRELIMINARY RESULTS* Echocardiogram 2D Echocardiogram has been performed.  Cristela BlueHege, Lazaria Schaben 11/23/2016, 11:43 AM

## 2016-11-23 NOTE — Plan of Care (Signed)
Problem: Tissue Perfusion: Goal: Risk factors for ineffective tissue perfusion will decrease Outcome: Progressing Receiving po Elquis, awaiting 2Decho

## 2016-11-23 NOTE — Progress Notes (Signed)
[facility] [place] [facility]   PATIENT NAME: Ryan GaussCharles [name]    MR#:  [id number]  DATE OF BIRTH:  05/29/1971  SUBJECTIVE:  CHIEF COMPLAINT:  Patient denies any shortness of breath while resting  REVIEW OF SYSTEMS:  CONSTITUTIONAL: No fever, fatigue or weakness.  EYES: No blurred or double vision.  EARS, NOSE, AND THROAT: No tinnitus or ear pain.  RESPIRATORY: No cough, Reporting exertional shortness of breath, denies wheezing or hemoptysis.  CARDIOVASCULAR: No chest pain, orthopnea, edema.  GASTROINTESTINAL: No nausea, vomiting, diarrhea or abdominal pain.  GENITOURINARY: No dysuria, hematuria.  ENDOCRINE: No polyuria, nocturia,  HEMATOLOGY: No anemia, easy bruising or bleeding SKIN: No rash or lesion. MUSCULOSKELETAL: No joint pain or arthritis.   NEUROLOGIC: No tingling, numbness, weakness.  PSYCHIATRY: No anxiety or depression.   DRUG ALLERGIES:   Allergies  Allergen Reactions  . Ivp Dye [Iodinated Diagnostic Agents]     Kidney failure requiring dialysis  . Clindamycin/Lincomycin Rash    VITALS:  Blood pressure (!) 154/97, pulse 79, temperature 98.2 F (36.8 C), resp. rate 18, height 5\' 7"  (1.702 m), weight (!) 163.3 kg (360 lb), SpO2 92 %.  PHYSICAL EXAMINATION:  GENERAL:  46 y.o.-year-old patient lying in the bed with no acute distress. Morbidly obese EYES: Pupils equal, round, reactive to light and accommodation. No scleral icterus. Extraocular muscles intact.  HEENT: Head atraumatic, normocephalic. Oropharynx and nasopharynx clear.  NECK:  Supple, no jugular venous distention. No thyroid enlargement, no tenderness.  LUNGS: Normal breath sounds bilaterally, no wheezing, rales,rhonchi or crepitation. No use of accessory muscles of respiration.  CARDIOVASCULAR: S1, S2 normal. No murmurs, rubs, or gallops.  ABDOMEN: Soft, nontender, nondistended. Bowel sounds present. No organomegaly or mass.  EXTREMITIES: No pedal edema, cyanosis, or  clubbing.  NEUROLOGIC: Cranial nerves II through XII are intact. Muscle strength 5/5 in all extremities. Sensation intact. Gait not checked.  PSYCHIATRIC: The patient is alert and oriented x 3.  SKIN: No obvious rash, lesion, or ulcer.    LABORATORY PANEL:   CBC  Recent Labs Lab 11/23/16 0421  WBC 5.2  HGB 14.6  HCT 43.7  PLT 242   ------------------------------------------------------------------------------------------------------------------  Chemistries   Recent Labs Lab 11/23/16 0421  NA 139  K 4.1  CL 105  CO2 26  GLUCOSE 101*  BUN 18  CREATININE 1.53*  CALCIUM 9.2   ------------------------------------------------------------------------------------------------------------------  Cardiac Enzymes  Recent Labs Lab 11/22/16 1223  TROPONINI 0.03*   ------------------------------------------------------------------------------------------------------------------  RADIOLOGY:  Dg Chest 2 View  Result Date: 11/22/2016 CLINICAL DATA:  Shortness of breath. EXAM: CHEST  2 VIEW COMPARISON:  12/20/2014. FINDINGS: Mediastinum and hilar structures are normal. Lungs are clear. Heart size normal. No pleural effusion or pneumothorax. No acute bony abnormality identified. IVC filter noted. IMPRESSION: No acute cardiopulmonary disease. Electronically Signed   By: Maisie Fushomas  Register   On: 11/22/2016 10:18   [place] Venous Img Lower Bilateral  Result Date: 11/22/2016 CLINICAL DATA:  Lower extremity swelling EXAM: BILATERAL LOWER EXTREMITY VENOUS DUPLEX ULTRASOUND TECHNIQUE: Doppler venous assessment of the left lower extremity deep venous system was performed, including characterization of spectral flow, compressibility, and phasicity. COMPARISON:  07/13/2005 FINDINGS: The right common femoral vein is compressible. Doppler analysis demonstrates phasicity. The femoral vein throughout its course is partially compressible and contains partially occlusive DVT. There is similar partially  occlusive DVT in the popliteal vein. There is partially occlusive thrombus throughout the left common femoral, femoral, and popliteal veins. These vessels are partially compressible. Doppler  analysis demonstrates phasicity. Augmentation was deferred due to DVT. IMPRESSION: There is extensive partially occlusive DVT throughout the lower extremities as described above. The thrombus may be chronic or subacute. It is difficult to exclude acute DVT. Electronically Signed   By: Jolaine Click M.D.   On: 11/22/2016 16:51    EKG:   Orders placed or performed during the hospital encounter of 11/22/16  . ED EKG within 10 minutes  . ED EKG within 10 minutes  . EKG 12-Lead  . EKG 12-Lead    ASSESSMENT AND PLAN:   #Acute respiratory failure with hypoxia With a past medical history of pulmonary embolism Patient was noncompliant with the eliquis Continue oxygen by nasal cannular, DuoNeb when necessary. Possible pulmonary hypertension. Follow-up echocardiogram  Follow up with pulmonology Discontinued eliquis and started pt on weight-based Lovenox Outpatient follow-up with patient's primary [facility] oncologist  #Extensive bilateral DVTs . Suspected pulmonary embolism with past medical history of pulmonary embolism and DVT-right lower extremity  coag workup in the past was unremarkable Ultrasound could not rule out acute DVTs versus subacute or chronic   consult vascular surgery for possible thrombectomy Status post 2 filters in the past   #Accelerated hypertension. Start HCTZ and Lopressor, add IV hydralazine when necessary.  History of acute renal failure due to IV contrast. CKD stage 2.  Morbid obesity.-Lifestyle modification advised     All the records are reviewed and case discussed with Care Management/Social Workerr. Management plans discussed with the patient, family and they are in agreement.  CODE STATUS: fc   TOTAL TIME TAKING CARE OF THIS PATIENT: 36 minutes.   POSSIBLE D/C IN am   DAYS, DEPENDING ON CLINICAL CONDITION.  Note: This dictation was prepared with Dragon dictation along with smaller phrase technology. Any transcriptional errors that result from this process are unintentional.   Ramonita Lab M.D on 11/23/2016 at 4:52 PM  Between 7am to 6pm - Pager - [phone] After 6pm go to [email or web address] - password [email or web address]  [facility]  Office  [phone]  CC: Primary care physician; Sherrie Mustache

## 2016-11-23 NOTE — Care Management (Signed)
Patient with a 20 year history of  blood clot  issues.  Was currently taking Eliquis but did miss about 3 days of doses. Oxygen requirements acute.  Prior to this admission, Independent in all adls, denies issues accessing medical care, obtaining medications or with transportation. Works full time.  Current with  PCP.

## 2016-11-23 NOTE — Plan of Care (Signed)
Problem: Tissue Perfusion: Goal: Risk factors for ineffective tissue perfusion will decrease Outcome: Progressing Oral eliquis given

## 2016-11-23 NOTE — Consult Note (Signed)
[facility] Vascular Consult Note  MRN : [id number]  Ryan [name] is a 46 y.o. (09/15/1970) male who presents with chief complaint of  Chief Complaint  Patient presents with  . Shortness of Breath  .  History of Present Illness: I am asked to evaluate the patient for DVT and possible thrombolysis by Dr. Nicholos Johns. He is a 46 year old gentleman well known to me with a long history of hypercoagulable state as well as multiple DVTs and PEs.  He presented to [facility] yesterday with increasing shortness of breath and inability to lay flat. Today he is feeling much better he states that they have taken to liters of fluid off since his admission. He is currently being worked up for pulmonary hypertension.  Of note the patient denies increased swelling of his lower extremities. He denies leg pain. He states both of these things are typical when he has a new clot in his leg.  I have personally reviewed the images of the venous ultrasound bilateral lower extremities and the areas of noncompressibility show heterogeneous changes with recanalization consistent with chronic changes.  Current Facility-Administered Medications  Medication Dose Route Frequency Provider Last Rate Last Dose  . 0.9 %  sodium chloride infusion  250 mL Intravenous PRN Shaune Pollack, MD      . acetaminophen (TYLENOL) tablet 650 mg  650 mg Oral Q6H PRN Shaune Pollack, MD       Or  . acetaminophen (TYLENOL) suppository 650 mg  650 mg Rectal Q6H PRN Shaune Pollack, MD      . albuterol (PROVENTIL) (2.5 MG/3ML) 0.083% nebulizer solution 2.5 mg  2.5 mg Nebulization Q2H PRN Shaune Pollack, MD      . docusate sodium (COLACE) capsule 100 mg  100 mg Oral BID PRN Shaune Pollack, MD      . enoxaparin (LOVENOX) injection 165 mg  1 mg/kg Subcutaneous Q12H Ramonita Lab, MD   165 mg at 11/23/16 1540  . hydrALAZINE (APRESOLINE) injection 10 mg  10 mg Intravenous Q6H PRN Shaune Pollack, MD      .  HYDROcodone-acetaminophen (NORCO/VICODIN) 5-325 MG per tablet 1-2 tablet  1-2 tablet Oral Q4H PRN Shaune Pollack, MD      . insulin aspart (novoLOG) injection 0-5 Units  0-5 Units Subcutaneous QHS Shaune Pollack, MD      . insulin aspart (novoLOG) injection 0-9 Units  0-9 Units Subcutaneous TID WC Shaune Pollack, MD      . ketorolac (TORADOL) 30 MG/ML injection 30 mg  30 mg Intravenous Q6H PRN Shaune Pollack, MD      . metoprolol tartrate (LOPRESSOR) tablet 25 mg  25 mg Oral BID Shaune Pollack, MD   25 mg at 11/23/16 0865  . ondansetron (ZOFRAN) tablet 4 mg  4 mg Oral Q6H PRN Shaune Pollack, MD       Or  . ondansetron [facility]) injection 4 mg  4 mg Intravenous Q6H PRN Shaune Pollack, MD      . simethicone [facility]) chewable tablet 80 mg  80 mg Oral QID PRN Shaune Pollack, MD      . sodium chloride flush (NS) 0.9 % injection 3 mL  3 mL Intravenous Q12H Shaune Pollack, MD   3 mL at 11/23/16 0939  . sodium chloride flush (NS) 0.9 % injection 3 mL  3 mL Intravenous PRN Shaune Pollack, MD        Past Medical History:  Diagnosis Date  . Diabetes mellitus without complication (HCC)   .  DVT (deep venous thrombosis) (HCC)   . Hypertension   . Peripheral vascular disease (HCC)   . Presence of IVC filter     Past Surgical History:  Procedure Laterality Date  . PERIPHERAL VASCULAR CATHETERIZATION N/A 01/10/2015   Procedure: Dialysis/Perma Catheter Insertion;  Surgeon: Renford Dills, MD;  Location: [facility] INVASIVE CV LAB;  Service: Cardiovascular;  Laterality: N/A;  . PERIPHERAL VASCULAR CATHETERIZATION N/A 02/24/2015   Procedure: Dialysis/Perma Catheter Removal;  Surgeon: Annice Needy, MD;  Location: [facility] INVASIVE CV LAB;  Service: Cardiovascular;  Laterality: N/A;    Social History Social History  Substance Use Topics  . Smoking status: Never Smoker  . Smokeless tobacco: Never Used  . Alcohol use No    Family History History reviewed. No pertinent family history. No family history of bleeding/clotting disorders, porphyria or autoimmune  disease   Allergies  Allergen Reactions  . Ivp Dye [Iodinated Diagnostic Agents]     Kidney failure requiring dialysis  . Clindamycin/Lincomycin Rash     REVIEW OF SYSTEMS (Negative unless checked)  Constitutional: [] Weight loss  [] Fever  [] Chills Cardiac: [] Chest pain   [] Chest pressure   [] Palpitations   [] Shortness of breath when laying flat   [x] Shortness of breath at rest   [x] Shortness of breath with exertion. Vascular:  [] Pain in legs with walking   [] Pain in legs at rest   [] Pain in legs when laying flat   [] Claudication   [] Pain in feet when walking  [] Pain in feet at rest  [] Pain in feet when laying flat   [x] History of DVT   [] Phlebitis   [] Swelling in legs   [] Varicose veins   [] Non-healing ulcers Pulmonary:   [] Uses home oxygen   [] Productive cough   [] Hemoptysis   [] Wheeze  [] COPD   [] Asthma Neurologic:  [] Dizziness  [] Blackouts   [] Seizures   [] History of stroke   [] History of TIA  [] Aphasia   [] Temporary blindness   [] Dysphagia   [] Weakness or numbness in arms   [] Weakness or numbness in legs Musculoskeletal:  [] Arthritis   [] Joint swelling   [] Joint pain   [] Low back pain Hematologic:  [] Easy bruising  [] Easy bleeding   [] Hypercoagulable state   [] Anemic  [] Hepatitis Gastrointestinal:  [] Blood in stool   [] Vomiting blood  [] Gastroesophageal reflux/heartburn   [] Difficulty swallowing. Genitourinary:  [x] Chronic kidney disease   [] Difficult urination  [] Frequent urination  [] Burning with urination   [] Blood in urine Skin:  [] Rashes   [] Ulcers   [] Wounds Psychological:  [] History of anxiety   []  History of major depression.  Physical Examination  Vitals:   11/22/16 2034 11/23/16 0433 11/23/16 0802 11/23/16 1323  BP: (!) 162/93 139/89 (!) 151/107 (!) 154/97  Pulse: 83 85 93 79  Resp: 14 18 17 18   Temp: 98.1 F (36.7 C) 98 F (36.7 C) 99.3 F (37.4 C) 98.2 F (36.8 C)  TempSrc: Oral  Oral   SpO2: 94% 93% 91% 92%  Weight:      Height:       Body mass index is  56.38 kg/m. Gen:  WD/WN, NAD Head: [place]/AT, No temporalis wasting. Prominent temp pulse not noted. Ear/Nose/Throat: Hearing grossly intact, nares w/o erythema or drainage, oropharynx w/o Erythema/Exudate Eyes: Sclera non-icteric, conjunctiva clear Neck: Trachea midline.  No JVD.  Pulmonary:  Good air movement, respirations not labored, equal bilaterally.  Cardiac: RRR, normal S1, S2. Vascular:   2+ edema of the ankles bilaterally Gastrointestinal: soft, non-tender/non-distended. No guarding/reflex.  Musculoskeletal: M/S 5/5 throughout.  Extremities without ischemic changes.  No deformity or atrophy. No edema. Neurologic: Sensation grossly intact in extremities.  Symmetrical.  Speech is fluent. Motor exam as listed above. Psychiatric: Judgment intact, Mood & affect appropriate for pt's clinical situation. Dermatologic: No rashes or ulcers noted.  No cellulitis or open wounds. Lymph : No Cervical, Axillary, or Inguinal lymphadenopathy.      CBC Lab Results  Component Value Date   WBC 5.2 11/23/2016   HGB 14.6 11/23/2016   HCT 43.7 11/23/2016   MCV 90.8 11/23/2016   PLT 242 11/23/2016    BMET    Component Value Date/Time   NA 139 11/23/2016 0421   NA 130 (L) 12/28/2014 1602   K 4.1 11/23/2016 0421   K 3.8 12/28/2014 1602   CL 105 11/23/2016 0421   CL 94 (L) 12/28/2014 1602   CO2 26 11/23/2016 0421   CO2 24 12/28/2014 1602   GLUCOSE 101 (H) 11/23/2016 0421   GLUCOSE 121 (H) 12/28/2014 1602   BUN 18 11/23/2016 0421   BUN 57 (H) 12/28/2014 1602   CREATININE 1.53 (H) 11/23/2016 0421   CREATININE 5.55 (H) 12/28/2014 1602   CALCIUM 9.2 11/23/2016 0421   CALCIUM 8.1 (L) 12/28/2014 1602   GFRNONAA 53 (L) 11/23/2016 0421   GFRNONAA 11 (L) 12/28/2014 1602   GFRAA >60 11/23/2016 0421   GFRAA 13 (L) 12/28/2014 1602   Estimated Creatinine Clearance: 88.6 mL/min (A) (by C-G formula based on SCr of 1.53 mg/dL (H)).  COAG Lab Results  Component Value Date   INR 1.2 12/23/2014    INR 1.0 12/20/2014    Radiology Dg Chest 2 View  Result Date: 11/22/2016 CLINICAL DATA:  Shortness of breath. EXAM: CHEST  2 VIEW COMPARISON:  12/20/2014. FINDINGS: Mediastinum and hilar structures are normal. Lungs are clear. Heart size normal. No pleural effusion or pneumothorax. No acute bony abnormality identified. IVC filter noted. IMPRESSION: No acute cardiopulmonary disease. Electronically Signed   By: Maisie Fushomas  Register   On: 11/22/2016 10:18   [place] Venous Img Lower Bilateral  Result Date: 11/22/2016 CLINICAL DATA:  Lower extremity swelling EXAM: BILATERAL LOWER EXTREMITY VENOUS DUPLEX ULTRASOUND TECHNIQUE: Doppler venous assessment of the left lower extremity deep venous system was performed, including characterization of spectral flow, compressibility, and phasicity. COMPARISON:  07/13/2005 FINDINGS: The right common femoral vein is compressible. Doppler analysis demonstrates phasicity. The femoral vein throughout its course is partially compressible and contains partially occlusive DVT. There is similar partially occlusive DVT in the popliteal vein. There is partially occlusive thrombus throughout the left common femoral, femoral, and popliteal veins. These vessels are partially compressible. Doppler analysis demonstrates phasicity. Augmentation was deferred due to DVT. IMPRESSION: There is extensive partially occlusive DVT throughout the lower extremities as described above. The thrombus may be chronic or subacute. It is difficult to exclude acute DVT. Electronically Signed   By: Jolaine ClickArthur  Hoss M.D.   On: 11/22/2016 16:51      Assessment/Plan 1. Chronic DVT bilateral lower extremities: I would continue his anticoagulation. I would not perform any thrombolysis at this time as there is no substantial evidence of acute thrombus. 2. Acute respiratory failure with hypoxia: Patient is currently undergoing pulmonary evaluation. He is being maintained on supplemental oxygen by nasal cannula. He  states with his diuresis he is tremendously improved over his condition yesterday. Workup is ongoing. 3. Hypertension: Medications as prescribed no changes at this time   Levora DredgeGregory Jeaneane Adamec, MD  11/23/2016 6:30 PM    This note was created  with Dragon medical transcription system.  Any error is purely unintentional

## 2016-11-23 NOTE — Progress Notes (Signed)
ANTICOAGULATION CONSULT NOTE - Initial Consult  Pharmacy Consult for enoxaparin Indication: multiple VTE  Allergies  Allergen Reactions  . Ivp Dye [Iodinated Diagnostic Agents]     Kidney failure requiring dialysis  . Clindamycin/Lincomycin Rash    Patient Measurements: Height: 5\' 7"  (170.2 cm) Weight: (!) 360 lb (163.3 kg) IBW/kg (Calculated) : 66.1 Heparin Dosing Weight:   Vital Signs: Temp: 98.2 F (36.8 C) (03/27 1323) Temp Source: Oral (03/27 0802) BP: 154/97 (03/27 1323) Pulse Rate: 79 (03/27 1323)  Labs:  Recent Labs  11/22/16 0937 11/22/16 1223 11/23/16 0421  HGB 14.5  --  14.6  HCT 42.9  --  43.7  PLT 229  --  242  CREATININE 1.44*  --  1.53*  TROPONINI 0.04* 0.03*  --     Estimated Creatinine Clearance: 88.6 mL/min (A) (by C-G formula based on SCr of 1.53 mg/dL (H)).   Medical History: Past Medical History:  Diagnosis Date  . Diabetes mellitus without complication (HCC)   . DVT (deep venous thrombosis) (HCC)   . Hypertension   . Peripheral vascular disease (HCC)   . Presence of IVC filter     Medications:  Infusions:    Assessment: [age] yom with multiple VTE despite Eliquis (patient missed 3 days on vacation) and two IVC. Pharmacy consulted to dose LMWH for VTE tx.  Goal of Therapy:  Anti-Xa level 0.6-1 units/ml 4hrs after LMWH dose given Monitor platelets by anticoagulation protocol: Yes   Plan:  Lovenox 1 mg/kg = 165 mg subcutaneously twice daily. I spoke with lab who believes it is possible to draw a LMWH anti-Xa level (recommended for patients >160 kg or <45 kg). Will try to assess level approximately 4 hours after 3rd therapeutic dose.  Carola FrostNathan A Tnia Anglada, Pharm.D., BCPS Clinical Pharmacist 11/23/2016,2:05 PM

## 2016-11-24 ENCOUNTER — Encounter: Payer: Self-pay | Admitting: Physician Assistant

## 2016-11-24 ENCOUNTER — Telehealth: Payer: Self-pay

## 2016-11-24 DIAGNOSIS — R000 Tachycardia, unspecified: Secondary | ICD-10-CM

## 2016-11-24 DIAGNOSIS — I5031 Acute diastolic (congestive) heart failure: Secondary | ICD-10-CM

## 2016-11-24 DIAGNOSIS — I493 Ventricular premature depolarization: Secondary | ICD-10-CM

## 2016-11-24 DIAGNOSIS — R0603 Acute respiratory distress: Secondary | ICD-10-CM

## 2016-11-24 DIAGNOSIS — I824Z9 Acute embolism and thrombosis of unspecified deep veins of unspecified distal lower extremity: Secondary | ICD-10-CM

## 2016-11-24 DIAGNOSIS — E669 Obesity, unspecified: Secondary | ICD-10-CM

## 2016-11-24 DIAGNOSIS — I498 Other specified cardiac arrhythmias: Secondary | ICD-10-CM

## 2016-11-24 LAB — CBC
HCT: 45.5 % (ref 40.0–52.0)
Hemoglobin: 15.2 g/dL (ref 13.0–18.0)
MCH: 30.6 pg (ref 26.0–34.0)
MCHC: 33.5 g/dL (ref 32.0–36.0)
MCV: 91.3 fL (ref 80.0–100.0)
Platelets: 261 10*3/uL (ref 150–440)
RBC: 4.98 MIL/uL (ref 4.40–5.90)
RDW: 15.4 % — ABNORMAL HIGH (ref 11.5–14.5)
WBC: 4.4 10*3/uL (ref 3.8–10.6)

## 2016-11-24 LAB — GLUCOSE, CAPILLARY
GLUCOSE-CAPILLARY: 125 mg/dL — AB (ref 65–99)
Glucose-Capillary: 94 mg/dL (ref 65–99)
Glucose-Capillary: 114 mg/dL — ABNORMAL HIGH (ref 65–99)

## 2016-11-24 LAB — TSH: TSH: 2.624 u[IU]/mL (ref 0.350–4.500)

## 2016-11-24 LAB — MAGNESIUM: Magnesium: 2 mg/dL (ref 1.7–2.4)

## 2016-11-24 MED ORDER — HYDROCHLOROTHIAZIDE 25 MG PO TABS
25.0000 mg | ORAL_TABLET | Freq: Every day | ORAL | 0 refills | Status: DC
Start: 2016-11-24 — End: 2017-08-12

## 2016-11-24 MED ORDER — APIXABAN 5 MG PO TABS: 5.0000 mg | ORAL_TABLET | Freq: Two times a day (BID) | ORAL | Status: DC

## 2016-11-24 MED ORDER — APIXABAN 5 MG PO TABS: 10.0000 mg | ORAL_TABLET | Freq: Two times a day (BID) | ORAL | 0 refills | Status: DC

## 2016-11-24 MED ORDER — LISINOPRIL 10 MG PO TABS: 10.0000 mg | ORAL_TABLET | Freq: Every day | ORAL | 0 refills | Status: DC

## 2016-11-24 MED ORDER — APIXABAN 5 MG PO TABS: 10.0000 mg | ORAL_TABLET | Freq: Two times a day (BID) | ORAL | Status: DC

## 2016-11-24 MED ORDER — APIXABAN 5 MG PO TABS
10.0000 mg | ORAL_TABLET | Freq: Once | ORAL | Status: AC
  Administered 2016-11-24: 10 mg via ORAL
  Filled 2016-11-24: qty 2

## 2016-11-24 NOTE — Progress Notes (Signed)
SATURATION QUALIFICATIONS: (This note is used to comply with regulatory documentation for home oxygen)  Patient Saturations on Room Air at Rest = 94%  Patient Saturations on Room Air while Ambulating = 85%  Patient Saturations on 2 Liters of oxygen while Ambulating = 92%  Please briefly explain why patient needs home oxygen: 

## 2016-11-24 NOTE — Telephone Encounter (Signed)
Error

## 2016-11-24 NOTE — Discharge Summary (Signed)
[facility] [place] [facility]   PATIENT NAME: Ryan GaussCharles Collington    MR#:  [id number]  DATE OF BIRTH:  [birth year]  DATE OF ADMISSION:  11/22/2016 ADMITTING PHYSICIAN: Shaune PollackQing Chen, MD  DATE OF DISCHARGE: 11/24/16 PRIMARY CARE PHYSICIAN: Sherrie MustacheFayegh Jadali    ADMISSION DIAGNOSIS:  Orthopnea [R06.01] SOB (shortness of breath) [R06.02] Acute respiratory failure with hypoxia (HCC) [J96.01] Bilateral lower extremity edema [R60.0]  DISCHARGE DIAGNOSIS:  Principal Problem:   Acute respiratory distress Active Problems:   DVT (deep venous thrombosis) (HCC)   Pulmonary emboli (HCC)   PVC (premature ventricular contraction)   Ventricular trigeminy   Super obesity (HCC)   SECONDARY DIAGNOSIS:   Past Medical History:  Diagnosis Date  . Diabetes mellitus without complication (HCC)   . DVT (deep venous thrombosis) (HCC)   . Hypertension   . Peripheral vascular disease (HCC)   . Presence of IVC filter     HOSPITAL COURSE:  HPI Ryan GaussCharles Pearman  is a 46 y.o. male with a known history of PE and DVT, PVD, hypertension and diabetes. The patient presents to the ED with above chief complaints. He was on vacation and didn't take Eliquis for 3 days. He has had shortness of breath for a few days, which has been worsening this morning. She he denies any chest pain, palpitation or hemoptysis. His oxygen level decreased to 85% in ED. ED physician discussed with Dr. Sung AmabileSimonds, who suggested possible pulmonary hypertension and get a echocardiograph. The patient was treated with Eliquis and Lasix in the ED.  Hospital course  #Acute respiratory failure with hypoxia With a past medical history of pulmonary embolism Patient was noncompliant with the eliquis Continue oxygen by nasal cannular, DuoNeb when necessary. Follow-up echocardiogram with EF 60-65%, technically difficult study because of the body habitus Follow up with pulmonology Discontinued eliquis and started pt on weight-based  Lovenox, clinically improved plan is to resume him on eliquis 10 mg by mouth twice a day for 1 week followed by 5 mg by mouth twice a day and outpatient follow-up with Dr. Smith Robertao as per minute discussion with Dr. Guy SandiferBramanday today Outpatient follow-up with oncologist dr.Rao  #Extensive bilateral DVTs . Suspected pulmonary embolism with past medical history of pulmonary embolism and DVT-right lower extremity  coag workup in the past was unremarkable Ultrasound could not rule out acute DVTs versus subacute or chronic   consulted vascular surgery for possible thrombectomy, invasive processes are not needed according to vascular surgery at this time as this seems to be chronic scarring. Okay to discharge patient from pulmonology, vascular surgery and oncology standpoint Status post 2 filters in the past . Reinforced the importance of being compliant with his medications    #Accelerated hypertension. Started  HCTZ andLopressor, given IV hydralazine when necessary.  #Sinus tachycardia with they're intermittent episodes of full trigeminy and bigeminy Patient is asymptomatic and preserved ejection fraction in the setting of chronic DVT and noncompliance No recommendations from cardiology standpoint at this time, outpatient follow-up as needed  History of acute renal failure due to IV contrast. CKD stage 2.  Morbid obesity.-Lifestyle modification advised   DISCHARGE CONDITIONS:   FAIR  CONSULTS OBTAINED:  Treatment Team:  Mertie MooresHerbon E Fleming, MD Yvonne Kendallhristopher End, MD   PROCEDURES NONE   DRUG ALLERGIES:   Allergies  Allergen Reactions  . Ivp Dye [Iodinated Diagnostic Agents]     Kidney failure requiring dialysis  . Clindamycin/Lincomycin Rash    DISCHARGE MEDICATIONS:   Current Discharge Medication List  CONTINUE these medications which have CHANGED   Details  apixaban (ELIQUIS) 5 MG TABS tablet Take 2 tablets (10 mg total) by mouth 2 (two) times daily. Qty: 75 tablet, Refills:  0    hydrochlorothiazide (HYDRODIURIL) 25 MG tablet Take 1 tablet (25 mg total) by mouth daily. Qty: 30 tablet, Refills: 0    lisinopril (PRINIVIL,ZESTRIL) 10 MG tablet Take 1 tablet (10 mg total) by mouth daily. Qty: 30 tablet, Refills: 0      CONTINUE these medications which have NOT CHANGED   Details  Vitamin D, Ergocalciferol, (DRISDOL) 50000 units CAPS capsule Take 50,000 Units by mouth every 7 (seven) days.    ALPRAZolam (XANAX) 0.25 MG tablet Take 1 tablet (0.25 mg total) by mouth every 8 (eight) hours as needed for anxiety. Qty: 30 tablet, Refills: 0    calcium acetate (PHOSLO) 667 MG capsule Take 2 capsules (1,334 mg total) by mouth 3 (three) times daily with meals. Qty: 90 capsule, Refills: 0    docusate sodium (COLACE) 100 MG capsule Take 100 mg by mouth 2 (two) times daily as needed for mild constipation.    metoprolol tartrate (LOPRESSOR) 25 MG tablet Take 25 mg by mouth 2 (two) times daily.    simethicone (MYLICON) 80 MG chewable tablet Chew 80 mg by mouth 4 (four) times daily as needed for flatulence.         DISCHARGE INSTRUCTIONS:   Follow-up with primary care physician in a week Follow-up with pulmonology Dr. Nicholos Johns in 2 weeks and outpatient sleep study Follow-up with oncology Dr. Smith Robert in 2 weeks Follow-up with cardiology as needed Continue oxygen 2 L via nasal cannula   DIET:  Cardiac diet  DISCHARGE CONDITION:  Stable  ACTIVITY:  Activity as tolerated  OXYGEN:  Home Oxygen: Yes.     Oxygen Delivery: 2 liters/min via Patient connected to nasal cannula oxygen  DISCHARGE LOCATION:  home   If you experience worsening of your admission symptoms, develop shortness of breath, life threatening emergency, suicidal or homicidal thoughts you must seek medical attention immediately by calling 911 or calling your MD immediately  if symptoms less severe.  You Must read complete instructions/literature along with all the possible adverse  reactions/side effects for all the Medicines you take and that have been prescribed to you. Take any new Medicines after you have completely understood and accpet all the possible adverse reactions/side effects.   Please note  You were cared for by a hospitalist during your hospital stay. If you have any questions about your discharge medications or the care you received while you were in the hospital after you are discharged, you can call the unit and asked to speak with the hospitalist on call if the hospitalist that took care of you is not available. Once you are discharged, your primary care physician will handle any further medical issues. Please note that NO REFILLS for any discharge medications will be authorized once you are discharged, as it is imperative that you return to your primary care physician (or establish a relationship with a primary care physician if you do not have one) for your aftercare needs so that they can reassess your need for medications and monitor your lab values.     Today  Chief Complaint  Patient presents with  . Shortness of Breath   Patient is doing fine. Shortness of breath and chest discomfort significantly improved. Ambulated in the hallway on room air he was at 85% and on 2 L patient was saturating  at 92%. Reinforced the importance of being compliant with his medications  ROS:  CONSTITUTIONAL: Denies fevers, chills. Denies any fatigue, weakness.  EYES: Denies blurry vision, double vision, eye pain. EARS, NOSE, THROAT: Denies tinnitus, ear pain, hearing loss. RESPIRATORY: Denies cough, wheeze, shortness of breath.  CARDIOVASCULAR: Denies chest pain, palpitations, edema.  GASTROINTESTINAL: Denies nausea, vomiting, diarrhea, abdominal pain. Denies bright red blood per rectum. GENITOURINARY: Denies dysuria, hematuria. ENDOCRINE: Denies nocturia or thyroid problems. HEMATOLOGIC AND LYMPHATIC: Denies easy bruising or bleeding. SKIN: Denies rash or  lesion. MUSCULOSKELETAL: Denies pain in neck, back, shoulder, knees, hips or arthritic symptoms.  NEUROLOGIC: Denies paralysis, paresthesias.  PSYCHIATRIC: Denies anxiety or depressive symptoms.   VITAL SIGNS:  Blood pressure 139/82, pulse 74, temperature 98.2 F (36.8 C), temperature source Oral, resp. rate 18, height 5\' 7"  (1.702 m), weight (!) 163.3 kg (360 lb), SpO2 93 %.  I/O:    Intake/Output Summary (Last 24 hours) at 11/24/16 1445 Last data filed at 11/24/16 0900  Gross per 24 hour  Intake              606 ml  Output             1000 ml  Net             -394 ml    PHYSICAL EXAMINATION:  GENERAL:  46 y.o.-year-old patient lying in the bed with no acute distress.  EYES: Pupils equal, round, reactive to light and accommodation. No scleral icterus. Extraocular muscles intact.  HEENT: Head atraumatic, normocephalic. Oropharynx and nasopharynx clear.  NECK:  Supple, no jugular venous distention. No thyroid enlargement, no tenderness.  LUNGS: Normal breath sounds bilaterally, no wheezing, rales,rhonchi or crepitation. No use of accessory muscles of respiration.  CARDIOVASCULAR: S1, S2 normal. No murmurs, rubs, or gallops.  ABDOMEN: Soft, non-tender, non-distended. Bowel sounds present. No organomegaly or mass.  EXTREMITIES: No pedal edema, cyanosis, or clubbing.  NEUROLOGIC: Cranial nerves II through XII are intact. Muscle strength 5/5 in all extremities. Sensation intact. Gait not checked.  PSYCHIATRIC: The patient is alert and oriented x 3.  SKIN: No obvious rash, lesion, or ulcer.   DATA REVIEW:   CBC  Recent Labs Lab 11/24/16 0439  WBC 4.4  HGB 15.2  HCT 45.5  PLT 261    Chemistries   Recent Labs Lab 11/23/16 0421 11/24/16 0439  NA 139  --   K 4.1  --   CL 105  --   CO2 26  --   GLUCOSE 101*  --   BUN 18  --   CREATININE 1.53*  --   CALCIUM 9.2  --   MG  --  2.0    Cardiac Enzymes  Recent Labs Lab 11/22/16 1223  TROPONINI 0.03*     Microbiology Results  Results for orders placed or performed during the hospital encounter of 12/24/14  Culture, blood (single)     Status: None (Preliminary result)   Collection Time: 12/27/14  8:34 PM  Result Value Ref Range Status   Micro Text Report   Preliminary       COMMENT                   NO GROWTH IN 36 HOURS   ANTIBIOTIC  Culture, blood (single)     Status: None (Preliminary result)   Collection Time: 12/27/14  8:40 PM  Result Value Ref Range Status   Micro Text Report   Preliminary       COMMENT                   NO GROWTH IN 36 HOURS   ANTIBIOTIC                                                        RADIOLOGY:  Dg Chest 2 View  Result Date: 11/22/2016 CLINICAL DATA:  Shortness of breath. EXAM: CHEST  2 VIEW COMPARISON:  12/20/2014. FINDINGS: Mediastinum and hilar structures are normal. Lungs are clear. Heart size normal. No pleural effusion or pneumothorax. No acute bony abnormality identified. IVC filter noted. IMPRESSION: No acute cardiopulmonary disease. Electronically Signed   By: Maisie Fus  Register   On: 11/22/2016 10:18   US Venous Img Lower Bilateral  Result Date: 11/22/2016 CLINICAL DATA:  Lower extremity swelling EXAM: BILATERAL LOWER EXTREMITY VENOUS DUPLEX ULTRASOUND TECHNIQUE: Doppler venous assessment of the left lower extremity deep venous system was performed, including characterization of spectral flow, compressibility, and phasicity. COMPARISON:  07/13/2005 FINDINGS: The right common femoral vein is compressible. Doppler analysis demonstrates phasicity. The femoral vein throughout its course is partially compressible and contains partially occlusive DVT. There is similar partially occlusive DVT in the popliteal vein. There is partially occlusive thrombus throughout the left common femoral, femoral, and popliteal veins. These vessels are partially compressible. Doppler analysis demonstrates  phasicity. Augmentation was deferred due to DVT. IMPRESSION: There is extensive partially occlusive DVT throughout the lower extremities as described above. The thrombus may be chronic or subacute. It is difficult to exclude acute DVT. Electronically Signed   By: Jolaine Click M.D.   On: 11/22/2016 16:51    EKG:   Orders placed or performed during the hospital encounter of 11/22/16  . ED EKG within 10 minutes  . ED EKG within 10 minutes  . EKG 12-Lead  . EKG 12-Lead      Management plans discussed with the patient, family and they are in agreement.  CODE STATUS:     Code Status Orders        Start     Ordered   11/22/16 1651  Full code  Continuous     11/22/16 1650    Code Status History    Date Active Date Inactive Code Status Order ID Comments User Context   12/28/2014 11:45 PM 01/15/2015 10:11 PM Full Code [id number]  Gasper Sells, RN Inpatient      TOTAL TIME TAKING CARE OF THIS PATIENT: 45  minutes.   Note: This dictation was prepared with Dragon dictation along with smaller phrase technology. Any transcriptional errors that result from this process are unintentional.   @MEC @  on 11/24/2016 at 2:45 PM  Between 7am to 6pm - Pager - [phone]  After 6pm go to [email or web address] - password [email or web address] [facility]  [place]  [facility]  Office  [phone]  CC: Primary care physician; Sherrie Mustache

## 2016-11-24 NOTE — Progress Notes (Signed)
Pt has run of trigemmany and PVCs with ambulation. Pt is also ST with activity. MD notified. Orders for cardio consult. I will continue to assess.

## 2016-11-24 NOTE — Care Management (Signed)
Patient for discharge home today.  His wife verbalizes patient is noncompliant with treatment regime and voices concern patient will not comply with oxygen use.  Discussed rationale for treatment with patient.  He anticipates also getting an appointment for a sleep study.  No agency preference just so long as it is in network.  Wishes to have a portable concentrator assessment.  Contacted [facility]

## 2016-11-24 NOTE — Discharge Instructions (Signed)
Follow-up with primary care physician in a week Follow-up with pulmonology Dr. Nicholos Johnsamachandran in 2 weeks and outpatient sleep study Follow-up with oncology Dr. Smith Robertao in 2 weeks Follow-up with cardiology as needed Continue oxygen 2 L via nasal cannula

## 2016-11-24 NOTE — Progress Notes (Signed)
ANTICOAGULATION CONSULT NOTE - Initial Consult  Pharmacy Consult for Apixaban Indication: DVT  Allergies  Allergen Reactions  . Ivp Dye [Iodinated Diagnostic Agents]     Kidney failure requiring dialysis  . Clindamycin/Lincomycin Rash    Patient Measurements: Height: 5\' 7"  (170.2 cm) Weight: (!) 360 lb (163.3 kg) IBW/kg (Calculated) : 66.1 Heparin Dosing Weight:   Vital Signs: Temp: 98.2 F (36.8 C) (03/28 0525) Temp Source: Oral (03/28 0525) BP: 139/82 (03/28 0525) Pulse Rate: 74 (03/28 0525)  Labs:  Recent Labs  11/22/16 0937 11/22/16 1223 11/23/16 0421 11/24/16 0439  HGB 14.5  --  14.6 15.2  HCT 42.9  --  43.7 45.5  PLT 229  --  242 261  CREATININE 1.44*  --  1.53*  --   TROPONINI 0.04* 0.03*  --   --     Estimated Creatinine Clearance: 88.6 mL/min (A) (by C-G formula based on SCr of 1.53 mg/dL (H)).   Medical History: Past Medical History:  Diagnosis Date  . Diabetes mellitus without complication (HCC)   . DVT (deep venous thrombosis) (HCC)   . Hypertension   . Peripheral vascular disease (HCC)   . Presence of IVC filter      Assessment: 46 yo male w/ Hx DVT,PE, s/p IVC filter, now with +Bilat LE DVT, Afib. Patient was on vacation and did not take Apixaban x 3 days.  Goal of Therapy:  Monitor platelets by anticoagulation protocol: Yes   Plan:  Will begin Apixaban 10 mg po BID x 7 days (Treatment dose for DVT), then continue with Apixaban 5 mg po bid. May give 1st dose Apixaban ~10-12 hours after last Lovenox dose. ( Last Lovenox (enoxaparin) dose was charted at 0523 this am 3/28). f/u CBC, Scr every 3 days.  Delvon Chipps A 11/24/2016,11:26 AM

## 2016-11-24 NOTE — Consult Note (Signed)
Cardiology Consultation Note  Patient ID: Ryan [name], MRN: [id number], DOB/AGE: Jun 18, 1971 46 y.o. Admit date: 11/22/2016   Date of Consult: 11/24/2016 Primary Physician: Sherrie Mustache Primary Cardiologist: New to [facility] - consult by  Requesting Physician: Dr. Amado Coe, MD  Chief Complaint: SOB Reason for Consult: Sinus tachycardia/frequent PVCs  HPI: 46 y.o. male with h/o prior PE/DVT, acute renal failure secondary to IV contrast, hypertension, diabetes, MTHFR gene mutation, and morbid obesity who presented to [facility] on 3/26 with increased shortness of breath and orthopnea. He was noted to have acute hypoxic respiratory failure upon admission with oxygen saturations in the mid-80s on room air in the ED. He has been diuresed with IV Lasix and has a net negative urine output of 2 L, has been treated for presumed PE in the setting of bilateral lower extremity DVT (acute versus subacute). While ambulating today to assess for need of oxygen therapy patient had episode of sinus tachycardia as well as frequent PVCs occasionally noted to be ventricular trigeminy. Patient was completely asymptomatic. Cardiology was asked to evaluate the above.  Patient reports PCP has previously had patient undergo treadmill stress testing that he reports as normal. We do not have these results for review at this time. Patient has previously been on Eliquis since spring of 2016 secondary to DVT/PE. He was recently on vacation this past weekend and forgot to bring his Eliquis with him to the beach, thus missing 3 consecutive doses. He otherwise notes complete compliance with his medications. On 3/25 patient was in his usual state of health until that evening when he developed shortness of breath while attempting to lay supine. He had to sleep upright in a chair that night. Upon awaking the next day the patient continued to note shortness of breath at rest that was worse when laying supine. This prompted him to come to [facility] ED for  further evaluation. He denies having any chest pain, hemoptysis, fever, chills, lower extremity pain, lower extremity erythema, lobectomy warmth, or lower extremity swelling. Upon the patient's arrival to [facility] they were found to have stable blood pressure and heart rate of 83 BPM. Oxygen saturations were noted to be 93% on room air requiring nasal cannula.. ECG nonacute as below, CXR showed no acute cardiopulmonary process. Lower extremity ultrasound showed bilateral DVT (acute versus subacute) disease. Echocardiogram showed EF 60-65% regional wall motion abnormalities unable to be excluded, grade 1 diastolic dysfunction, normal RV systolic function, mildly dilated RV, unable to accurately estimate right-sided pressure. Patient was initially started on Eliquis for DVT as above and presumed PE however patient was subsequently transitioned to Lovenox then back to Eliquis date back to Lovenox again with the ultimate plan of being discharged on Eliquis at this time per internal medicine. He was diuresed as above. Early this afternoon RN was walking patient in the hallway to assess his oxygen status. On room air patient was hypoxic with oxygen saturations of 85%. While ambulating patient had sinus tach into the 1 teens as well as frequent PVCs some associated ventricular trigeminy. Patient was asymptomatic.   Past Medical History:  Diagnosis Date  . Diabetes mellitus without complication (HCC)   . DVT (deep venous thrombosis) (HCC)   . Hypertension   . Peripheral vascular disease (HCC)   . Presence of IVC filter       Most Recent Cardiac Studies: As above    Surgical History:  Past Surgical History:  Procedure Laterality Date  . PERIPHERAL VASCULAR CATHETERIZATION N/A 01/10/2015  Procedure: Dialysis/Perma Catheter Insertion;  Surgeon: Renford DillsGregory G Schnier, MD;  Location: [facility] INVASIVE CV LAB;  Service: Cardiovascular;  Laterality: N/A;  . PERIPHERAL VASCULAR CATHETERIZATION N/A 02/24/2015   Procedure:  Dialysis/Perma Catheter Removal;  Surgeon: Annice NeedyJason S Dew, MD;  Location: [facility] INVASIVE CV LAB;  Service: Cardiovascular;  Laterality: N/A;     Home Meds: Prior to Admission medications   Medication Sig Start Date End Date Taking? Authorizing Provider  apixaban (ELIQUIS) 2.5 MG TABS tablet Take 1 tablet (2.5 mg total) by mouth 2 (two) times daily. 01/15/15  Yes Katha HammingSnehalatha Konidena, MD  hydrochlorothiazide (HYDRODIURIL) 25 MG tablet Take 25 mg by mouth daily.   Yes Historical Provider, MD  lisinopril (PRINIVIL,ZESTRIL) 10 MG tablet Take 10 mg by mouth daily.   Yes Historical Provider, MD  Vitamin D, Ergocalciferol, (DRISDOL) 50000 units CAPS capsule Take 50,000 Units by mouth every 7 (seven) days.   Yes Historical Provider, MD  ALPRAZolam (XANAX) 0.25 MG tablet Take 1 tablet (0.25 mg total) by mouth every 8 (eight) hours as needed for anxiety. Patient not taking: Reported on 02/24/2015 01/15/15   Katha HammingSnehalatha Konidena, MD  calcium acetate (PHOSLO) 667 MG capsule Take 2 capsules (1,334 mg total) by mouth 3 (three) times daily with meals. 01/15/15   Katha HammingSnehalatha Konidena, MD  docusate sodium (COLACE) 100 MG capsule Take 100 mg by mouth 2 (two) times daily as needed for mild constipation.    Historical Provider, MD  metoprolol tartrate (LOPRESSOR) 25 MG tablet Take 25 mg by mouth 2 (two) times daily.    Historical Provider, MD  simethicone (MYLICON) 80 MG chewable tablet Chew 80 mg by mouth 4 (four) times daily as needed for flatulence.    Historical Provider, MD    Inpatient Medications:  . [START ON 11/25/2016] apixaban  10 mg Oral BID   Followed by  . [START ON 12/02/2016] apixaban  5 mg Oral BID  . apixaban  10 mg Oral Once  . insulin aspart  0-5 Units Subcutaneous QHS  . insulin aspart  0-9 Units Subcutaneous TID WC  . metoprolol tartrate  25 mg Oral BID  . sodium chloride flush  3 mL Intravenous Q12H     Allergies:  Allergies  Allergen Reactions  . Ivp Dye [Iodinated Diagnostic Agents]      Kidney failure requiring dialysis  . Clindamycin/Lincomycin Rash    Social History   Social History  . Marital status: Married    Spouse name: N/A  . Number of children: N/A  . Years of education: N/A   Occupational History  . Not on file.   Social History Main Topics  . Smoking status: Never Smoker  . Smokeless tobacco: Never Used  . Alcohol use No  . Drug use: No  . Sexual activity: Not on file   Other Topics Concern  . Not on file   Social History Narrative  . No narrative on file     Family History  Problem Relation Age of Onset  . Hypertension Mother      Review of Systems: Review of Systems  Constitutional: Positive for malaise/fatigue. Negative for chills, diaphoresis, fever and weight loss.  HENT: Negative for congestion.   Eyes: Negative for discharge and redness.  Respiratory: Negative for cough, hemoptysis, sputum production, shortness of breath and wheezing.   Cardiovascular: Negative for chest pain, palpitations, orthopnea, claudication, leg swelling and PND.  Gastrointestinal: Negative for abdominal pain, blood in stool, heartburn, melena, nausea and vomiting.  Genitourinary: Negative for hematuria.  Musculoskeletal: Negative for falls and myalgias.  Skin: Negative for rash.  Neurological: Positive for weakness. Negative for dizziness, tingling, tremors, sensory change, speech change, focal weakness and loss of consciousness.  Endo/Heme/Allergies: Does not bruise/bleed easily.  Psychiatric/Behavioral: Negative for substance abuse. The patient is not nervous/anxious.   All other systems reviewed and are negative.   Labs:  Recent Labs  11/22/16 0937 11/22/16 1223  TROPONINI 0.04* 0.03*   Lab Results  Component Value Date   WBC 4.4 11/24/2016   HGB 15.2 11/24/2016   HCT 45.5 11/24/2016   MCV 91.3 11/24/2016   PLT 261 11/24/2016     Recent Labs Lab 11/23/16 0421  NA 139  K 4.1  CL 105  CO2 26  BUN 18  CREATININE 1.53*  CALCIUM 9.2   GLUCOSE 101*   Lab Results  Component Value Date   CHOL 187 12/20/2014   HDL 39 (L) 12/20/2014   LDLCALC 128 (H) 12/20/2014   TRIG 101 12/20/2014   No results found for: DDIMER  Radiology/Studies:  Dg Chest 2 View  Result Date: 11/22/2016 CLINICAL DATA:  Shortness of breath. EXAM: CHEST  2 VIEW COMPARISON:  12/20/2014. FINDINGS: Mediastinum and hilar structures are normal. Lungs are clear. Heart size normal. No pleural effusion or pneumothorax. No acute bony abnormality identified. IVC filter noted. IMPRESSION: No acute cardiopulmonary disease. Electronically Signed   By: Maisie Fus  Register   On: 11/22/2016 10:18   US Venous Img Lower Bilateral  Result Date: 11/22/2016 CLINICAL DATA:  Lower extremity swelling EXAM: BILATERAL LOWER EXTREMITY VENOUS DUPLEX ULTRASOUND TECHNIQUE: Doppler venous assessment of the left lower extremity deep venous system was performed, including characterization of spectral flow, compressibility, and phasicity. COMPARISON:  07/13/2005 FINDINGS: The right common femoral vein is compressible. Doppler analysis demonstrates phasicity. The femoral vein throughout its course is partially compressible and contains partially occlusive DVT. There is similar partially occlusive DVT in the popliteal vein. There is partially occlusive thrombus throughout the left common femoral, femoral, and popliteal veins. These vessels are partially compressible. Doppler analysis demonstrates phasicity. Augmentation was deferred due to DVT. IMPRESSION: There is extensive partially occlusive DVT throughout the lower extremities as described above. The thrombus may be chronic or subacute. It is difficult to exclude acute DVT. Electronically Signed   By: Jolaine Click M.D.   On: 11/22/2016 16:51    EKG: Interpreted by me showed: NSR, 82 bpm, no acute ST-T changes Telemetry: Interpreted by me showed: Currently sinus rhythm, 70s bpm, occasional episodes of sinus tach into the 1 teens, frequent PVCs  occasionally with ventricular trigeminy  Weights: Filed Weights   11/22/16 0934  Weight: (!) 360 lb (163.3 kg)     Physical Exam: Blood pressure 139/82, pulse 74, temperature 98.2 F (36.8 C), temperature source Oral, resp. rate 18, height 5\' 7"  (1.702 m), weight (!) 360 lb (163.3 kg), SpO2 93 %. Body mass index is 56.38 kg/m. General: Well developed, well nourished, in no acute distress. Head: Normocephalic, atraumatic, sclera non-icteric, no xanthomas, nares are without discharge.  Neck: Negative for carotid bruits. JVD not elevated. Lungs: Clear bilaterally to auscultation without wheezes, rales, or rhonchi. Breathing is unlabored. Heart: RRR with S1 S2. No murmurs, rubs, or gallops appreciated. Abdomen: Obese, soft, non-tender, non-distended with normoactive bowel sounds. No hepatomegaly. No rebound/guarding. No obvious abdominal masses. Msk:  Strength and tone appear normal for age. Extremities: No clubbing or cyanosis. No edema. Changes consistent with chronic venous stasis noted. Distal pedal pulses are 2+ and equal bilaterally.  Neuro: Alert and oriented X 3. No facial asymmetry. No focal deficit. Moves all extremities spontaneously. Psych:  Responds to questions appropriately with a normal affect.    Assessment and Plan:  Principal Problem:   Acute respiratory distress Active Problems:   DVT (deep venous thrombosis) (HCC)   Pulmonary emboli (HCC)   PVC (premature ventricular contraction)   Ventricular trigeminy   Super obesity (HCC)    1. Acute respiratory distress with hypoxia: -Likely multifactorial including presumed PE (patient unable to undergo CTA PE protocol secondary to history of contrast-induced acute renal failure at the time of his last PE in the spring of 2016), mild component of acute on chronic diastolic CHF, super obesity with deconditioning, and possible component of pulmonary hypertension -Patient feels like his breathing is back to baseline at this  time, and is able to lay fully supine without issues -On room air at rest -Oxygen supplementation per internal medicine  2. Sinus tachycardia: -Almost certainly in the setting of the above -Not surprised patient developed mild sinus tachycardia in the setting of his deconditioning, super obesity, presumed PE, mild component of acute on chronic diastolic CHF, possible pulmonary hypertension -Patient completely asymptomatic -Would focus on underlying issue  3. PVCs/ventricular trigeminy: -Completely asymptomatic -Preserved ejection fraction -Likely in the setting of the above -Continue beta blocker titrate if needed and or as pulmonary status allows -Check magnesium and thyroid function -Potassium at goal -If persists could consider outpatient cardiac monitoring to quantify  4. Bilateral DVT (acute versus subacute)/presumed PE: -Initially started on Eliquis DVT dosing, then transitioned to Lovenox, then transitioned back to Eliquis, then transitioned back to Lovenox, now with discharge plan of Eliquis per internal medicine -Patient will need follow-up with PCP to address his clotting issues and duration of therapy -Patient unable to undergo CTA chest given history of contrast-induced acute renal failure -Could consider VQ scan if internal medicine feels this would be appropriate  5. Super obesity: -Weight loss advised, patient would likely benefit from pulmonary rehabilitation -Defer to internal medicine in setting this up if they feel indicated   Signed, Eula Listen, PA-C [facility] Pager: [phone] 11/24/2016, 1:49 PM

## 2016-11-26 ENCOUNTER — Telehealth: Payer: Self-pay | Admitting: Internal Medicine

## 2016-11-26 NOTE — Telephone Encounter (Signed)
-----   Message from Renea Ee, [place] sent at 12/06/8117  2:24 PM EDT ----- Please schedule as directed below.  Thanks, Misty  ----- Message ----- From: Merwyn Katos, MD Sent: 11/24/2016  12:10 PM To: Renea Ee, LPN  Please schedule follow up with DR in 6-8 weeks  Thanks  Theodoro Grist

## 2016-11-26 NOTE — Telephone Encounter (Signed)
Lmov for patient to call back and schedule appointment ° ° °

## 2016-11-29 NOTE — Telephone Encounter (Signed)
No ans no vm   °

## 2016-12-01 NOTE — Telephone Encounter (Signed)
No ans no vm   °

## 2016-12-03 ENCOUNTER — Encounter: Payer: Self-pay | Admitting: Pulmonary Disease

## 2016-12-03 NOTE — Telephone Encounter (Signed)
No ans no vm . 3rd attempt to contact.  Mailed letter.

## 2016-12-07 ENCOUNTER — Other Ambulatory Visit: Payer: Self-pay | Admitting: Internal Medicine

## 2016-12-07 DIAGNOSIS — R0602 Shortness of breath: Secondary | ICD-10-CM

## 2016-12-09 ENCOUNTER — Other Ambulatory Visit: Payer: Self-pay | Admitting: *Deleted

## 2016-12-09 DIAGNOSIS — I824Z9 Acute embolism and thrombosis of unspecified deep veins of unspecified distal lower extremity: Secondary | ICD-10-CM

## 2016-12-14 ENCOUNTER — Ambulatory Visit: Payer: 59

## 2016-12-15 ENCOUNTER — Encounter: Payer: Self-pay | Admitting: Family

## 2016-12-15 ENCOUNTER — Ambulatory Visit: Payer: 59 | Attending: Family | Admitting: Family

## 2016-12-15 VITALS — BP 138/80 | HR 82 | Resp 20 | Ht 67.0 in | Wt 370.4 lb

## 2016-12-15 DIAGNOSIS — I110 Hypertensive heart disease with heart failure: Secondary | ICD-10-CM | POA: Diagnosis not present

## 2016-12-15 DIAGNOSIS — E1151 Type 2 diabetes mellitus with diabetic peripheral angiopathy without gangrene: Secondary | ICD-10-CM | POA: Insufficient documentation

## 2016-12-15 DIAGNOSIS — R5383 Other fatigue: Secondary | ICD-10-CM | POA: Insufficient documentation

## 2016-12-15 DIAGNOSIS — I10 Essential (primary) hypertension: Secondary | ICD-10-CM | POA: Insufficient documentation

## 2016-12-15 DIAGNOSIS — I5033 Acute on chronic diastolic (congestive) heart failure: Secondary | ICD-10-CM | POA: Insufficient documentation

## 2016-12-15 DIAGNOSIS — I5032 Chronic diastolic (congestive) heart failure: Secondary | ICD-10-CM | POA: Insufficient documentation

## 2016-12-15 DIAGNOSIS — R5382 Chronic fatigue, unspecified: Secondary | ICD-10-CM

## 2016-12-15 DIAGNOSIS — Z86718 Personal history of other venous thrombosis and embolism: Secondary | ICD-10-CM | POA: Diagnosis not present

## 2016-12-15 DIAGNOSIS — I509 Heart failure, unspecified: Secondary | ICD-10-CM | POA: Diagnosis not present

## 2016-12-15 DIAGNOSIS — Z86711 Personal history of pulmonary embolism: Secondary | ICD-10-CM | POA: Diagnosis not present

## 2016-12-15 DIAGNOSIS — E669 Obesity, unspecified: Secondary | ICD-10-CM | POA: Insufficient documentation

## 2016-12-15 DIAGNOSIS — I5031 Acute diastolic (congestive) heart failure: Secondary | ICD-10-CM | POA: Insufficient documentation

## 2016-12-15 DIAGNOSIS — I825Z3 Chronic embolism and thrombosis of unspecified deep veins of distal lower extremity, bilateral: Secondary | ICD-10-CM

## 2016-12-15 NOTE — Patient Instructions (Signed)
Begin weighing daily and call for an overnight weight gain of > 2 pounds or a weekly weight gain of >5 pounds.  Decrease fluid intake to 50 ounces.

## 2016-12-15 NOTE — Progress Notes (Signed)
Patient ID: Ryan [name], male    DOB: Jun 26, 1971, 46 y.o.   MRN: [id number]  HPI  Ryan [name] is a 46 y/o male with a history of diabetes, DVT, HTN, PVD, obesity and chronic heart failure.   Reviewed last echo done on 11/23/16 which showed an EF of 60-65% without any valvular regurgitation.   Admitted 11/22/16 due to hypoxia with a PMH of pulmonary embolus and had been off eliquis for the last 3 days. Medications were adjusted  He presents today for his initial visit with a chief complaint of moderate shortness of breath. He describes it as chronic in nature over the last few months and occurs with little exertion such as when carrying in groceries. He has associated fatigue and cough.  Past Medical History:  Diagnosis Date  . Diabetes mellitus without complication (HCC)   . DVT (deep venous thrombosis) (HCC)   . Hypertension   . Peripheral vascular disease (HCC)   . Presence of IVC filter    Past Surgical History:  Procedure Laterality Date  . PERIPHERAL VASCULAR CATHETERIZATION N/A 01/10/2015   Procedure: Dialysis/Perma Catheter Insertion;  Surgeon: Renford Dills, MD;  Location: [facility] INVASIVE CV LAB;  Service: Cardiovascular;  Laterality: N/A;  . PERIPHERAL VASCULAR CATHETERIZATION N/A 02/24/2015   Procedure: Dialysis/Perma Catheter Removal;  Surgeon: Annice Needy, MD;  Location: [facility] INVASIVE CV LAB;  Service: Cardiovascular;  Laterality: N/A;   Family History  Problem Relation Age of Onset  . Hypertension Mother    Social History  Substance Use Topics  . Smoking status: Never Smoker  . Smokeless tobacco: Never Used  . Alcohol use No   Allergies  Allergen Reactions  . Ivp Dye [Iodinated Diagnostic Agents]     Kidney failure requiring dialysis  . Clindamycin/Lincomycin Rash   Prior to Admission medications   Medication Sig Start Date End Date Taking? Authorizing Provider  apixaban (ELIQUIS) 5 MG TABS tablet Take 2 tablets (10 mg total) by mouth 2 (two) times daily. 12/02/16  12/15/16 Yes Ramonita Lab, MD  hydrochlorothiazide (HYDRODIURIL) 25 MG tablet Take 1 tablet (25 mg total) by mouth daily. 11/24/16  Yes Ramonita Lab, MD  lisinopril (PRINIVIL,ZESTRIL) 10 MG tablet Take 1 tablet (10 mg total) by mouth daily. 11/24/16  Yes Ramonita Lab, MD  Vitamin D, Ergocalciferol, (DRISDOL) 50000 units CAPS capsule Take 50,000 Units by mouth every 7 (seven) days.   Yes Historical Provider, MD     Review of Systems  Constitutional: Positive for fatigue. Negative for appetite change.  HENT: Negative for congestion, postnasal drip and sore throat.   Eyes: Negative.   Respiratory: Positive for cough and shortness of breath. Negative for chest tightness.   Cardiovascular: Negative for chest pain, palpitations and leg swelling.  Gastrointestinal: Negative for abdominal distention and abdominal pain.  Endocrine: Negative.   Genitourinary: Negative.   Musculoskeletal: Negative for back pain and neck pain.  Skin: Negative.   Allergic/Immunologic: Negative.   Neurological: Negative for dizziness and light-headedness.  Hematological: Negative for adenopathy. Does not bruise/bleed easily.  Psychiatric/Behavioral: Negative for dysphoric mood and sleep disturbance. The patient is not nervous/anxious.    Vitals:   12/15/16 1214  BP: 138/80  Pulse: 82  Resp: 20  SpO2: 98%  Weight: (!) 370 lb 6 oz (168 kg)  Height:  (1.702 m)   Wt Readings from Last 3 Encounters:  12/15/16 (!) 37[name](168 kg)  11/22/16 (!) 360 lb (163.3 kg)  01/14/15 256 lb 13.4  oz (116.5 kg)   Lab Results  Component Value Date   CREATININE 1.53 (H) 11/23/2016   CREATININE 1.44 (H) 11/22/2016   CREATININE 14.74 (H) 01/11/2015   Physical Exam  Constitutional: He is oriented to person, place, and time. He appears well-developed and well-nourished.  HENT:  Head: Normocephalic and atraumatic.  Neck: Normal range of motion. Neck supple. No JVD present.  Cardiovascular: Normal rate and regular rhythm.    Pulmonary/Chest: Effort normal. He has no wheezes. He has no rales.  Abdominal: Soft. He exhibits no distension. There is no tenderness.  Musculoskeletal: He exhibits no edema or tenderness.  Neurological: He is alert and oriented to person, place, and time.  Skin: Skin is warm and dry.  Psychiatric: He has a normal mood and affect. His behavior is normal.  Nursing note and vitals reviewed.  Assessment & Plan:  1: Chronic heart failure with preserved ejection fraction- - NYHA class III - euvolemic - not weighing daily but does have scales. Instructed to start weighing daily and call for an overnight weight gain of >2 pounds or a weekly weight gain of >5 pounds - not adding salt and the importance of following a  sodium diet was reviewed. Written dietary information was given as well - unsure of fluid intake. Discussed keeping his daily fluid intake between 40-50 ounces of fluid a day - would like [place] to get him established with Dr. Juliann Pares  2: HTN- - BP looks good today - follows with PCP Dario Guardian)  3: Chronic DVT- - has had dozens of DVT's - has IVC filter - continues on apixaban twice daily  4: Fatigue- - waiting to get a home sleep study done to rule out sleep apnea  Patient did not bring his medications nor a list. Each medication was verbally reviewed with the patient and he was encouraged to bring the bottles to every visit to confirm accuracy of list.  Return in 1 month or sooner for any questions/problems before then.

## 2016-12-20 DIAGNOSIS — R0602 Shortness of breath: Secondary | ICD-10-CM | POA: Insufficient documentation

## 2016-12-23 ENCOUNTER — Encounter: Payer: Self-pay | Admitting: Oncology

## 2016-12-23 ENCOUNTER — Encounter (INDEPENDENT_AMBULATORY_CARE_PROVIDER_SITE_OTHER): Payer: Self-pay

## 2016-12-23 ENCOUNTER — Inpatient Hospital Stay: Payer: 59

## 2016-12-23 ENCOUNTER — Inpatient Hospital Stay: Payer: 59 | Attending: Oncology | Admitting: Oncology

## 2016-12-23 VITALS — BP 133/88 | HR 93 | Temp 98.9°F | Resp 20 | Ht 67.0 in | Wt 366.2 lb

## 2016-12-23 DIAGNOSIS — I5032 Chronic diastolic (congestive) heart failure: Secondary | ICD-10-CM | POA: Diagnosis not present

## 2016-12-23 DIAGNOSIS — R948 Abnormal results of function studies of other organs and systems: Secondary | ICD-10-CM

## 2016-12-23 DIAGNOSIS — Z86718 Personal history of other venous thrombosis and embolism: Secondary | ICD-10-CM | POA: Diagnosis not present

## 2016-12-23 DIAGNOSIS — R5381 Other malaise: Secondary | ICD-10-CM

## 2016-12-23 DIAGNOSIS — R9389 Abnormal findings on diagnostic imaging of other specified body structures: Secondary | ICD-10-CM

## 2016-12-23 DIAGNOSIS — Z7901 Long term (current) use of anticoagulants: Secondary | ICD-10-CM | POA: Diagnosis not present

## 2016-12-23 DIAGNOSIS — Z86711 Personal history of pulmonary embolism: Secondary | ICD-10-CM | POA: Diagnosis not present

## 2016-12-23 DIAGNOSIS — I825Z3 Chronic embolism and thrombosis of unspecified deep veins of distal lower extremity, bilateral: Secondary | ICD-10-CM

## 2016-12-23 DIAGNOSIS — R51 Headache: Secondary | ICD-10-CM | POA: Diagnosis not present

## 2016-12-23 DIAGNOSIS — I824Z9 Acute embolism and thrombosis of unspecified deep veins of unspecified distal lower extremity: Secondary | ICD-10-CM

## 2016-12-23 DIAGNOSIS — R5383 Other fatigue: Secondary | ICD-10-CM

## 2016-12-23 DIAGNOSIS — R0602 Shortness of breath: Secondary | ICD-10-CM

## 2016-12-23 DIAGNOSIS — R008 Other abnormalities of heart beat: Secondary | ICD-10-CM | POA: Diagnosis not present

## 2016-12-23 DIAGNOSIS — I2782 Chronic pulmonary embolism: Secondary | ICD-10-CM

## 2016-12-23 DIAGNOSIS — I110 Hypertensive heart disease with heart failure: Secondary | ICD-10-CM

## 2016-12-23 DIAGNOSIS — Z79899 Other long term (current) drug therapy: Secondary | ICD-10-CM | POA: Diagnosis not present

## 2016-12-23 DIAGNOSIS — I493 Ventricular premature depolarization: Secondary | ICD-10-CM

## 2016-12-23 LAB — COMPREHENSIVE METABOLIC PANEL
ALBUMIN: 4.1 g/dL (ref 3.5–5.0)
ALK PHOS: 56 U/L (ref 38–126)
ALT: 35 U/L (ref 17–63)
AST: 29 U/L (ref 15–41)
Anion gap: 8 (ref 5–15)
BILIRUBIN TOTAL: 0.9 mg/dL (ref 0.3–1.2)
BUN: 14 mg/dL (ref 6–20)
CO2: 23 mmol/L (ref 22–32)
Calcium: 9.1 mg/dL (ref 8.9–10.3)
Chloride: 104 mmol/L (ref 101–111)
Creatinine, Ser: 1.71 mg/dL — ABNORMAL HIGH (ref 0.61–1.24)
GFR calc Af Amer: 53 mL/min — ABNORMAL LOW (ref >60)
GFR calc non Af Amer: 46 mL/min — ABNORMAL LOW (ref >60)
GLUCOSE: 99 mg/dL (ref 65–99)
POTASSIUM: 4 mmol/L (ref 3.5–5.1)
SODIUM: 135 mmol/L (ref 135–145)
TOTAL PROTEIN: 7.8 g/dL (ref 6.5–8.1)

## 2016-12-23 LAB — CBC
HEMATOCRIT: 44.1 % (ref 40.0–52.0)
HEMOGLOBIN: 14.8 g/dL (ref 13.0–18.0)
MCH: 30 pg (ref 26.0–34.0)
MCHC: 33.5 g/dL (ref 32.0–36.0)
MCV: 89.6 fL (ref 80.0–100.0)
Platelets: 257 10*3/uL (ref 150–440)
RBC: 4.93 MIL/uL (ref 4.40–5.90)
RDW: 14.7 % — AB (ref 11.5–14.5)
WBC: 5.1 10*3/uL (ref 3.8–10.6)

## 2016-12-23 NOTE — Progress Notes (Signed)
Here for hospital f/u. Per pt has been dealing w " blood clot" issues since a teen. Most recent stated he had a "filter put into chest area "per pt. Also one in left leg. Stated on Sun Mar 25/18-pt sttaed he could not breathe when laying down.continues SOB,chest pressure. Pulse ox ws 83 in ER per pt. Admitted for 3 d. (here [facility] ) Treated for hypertension and Pox issues per pt. Saw cardiologist w neg findings per pt. Here for f/u. Stated he feels better . Using 02 when SOB @ home he added.

## 2016-12-23 NOTE — Progress Notes (Signed)
Hematology/Oncology Consult note [facility] [phone] [phone]  Patient Care Team: Sherrie Mustache as PCP - General (Internal Medicine)   Name of the patient: Ryan [name]  [id number]  08/11/1971    Reason for referral- bilateral LE DVT and PE   Referring physician- Dr. Dario Guardian  Date of visit: 12/23/16   History of presenting illness- Patient is a 46 yr old male with prior h/o recurrent DVT and PE. Patient states that his 1st episode was when he around 77 yrs of age. I do see evidence of DVT and PE based on imaging inour system atleast adting back to 2006. He had taken coumadin in the past but had trouble coming for his INR checks consistently and was switched to eliquis. He states he has been hematology from [facility] in the past and did undergo some work up for hypecoaulability at that time which was apparently negatiev. States that he possibly has MTHFR mutation in his family. Not sure if he has it as well. He was recently admitted to the hospital in March 2018 with shortness of breath after not having taken eliquis for about 3 days. He had doppler of b/l LE which again revealed DVT but could not differentiate acute versus subacute or chronic. He had CXR and ECHO but no CTPE. He was discharged on eliquis. Patient also has a long standing IVC filter due to his recurrent DVT/PE. Patient has been seen by dr. Sherrlyn Hock from our practice in the past in 2016 for his DVT/PE. At that time he was found to have inguinal adenopathy. This was followed by PET/CT which showed hypermetabolic adenopathy in inguinal and iliac areas inflammatory versus malignant. He did not have further work up at that time. Dr. Marilynn Rail states factor V leiden and antiphospholipid antibody was done and was negative.  Currently patient states that he has been taking his eliquis daily but reports frequent headaches and also feels his SOB is worse since he has started taking eliquis and would  not like to continue eliquis  ECOG PS- 2  Pain scale- 0   Review of systems- Review of Systems  Constitutional: Positive for malaise/fatigue. Negative for chills, fever and weight loss.  HENT: Negative for congestion, ear discharge and nosebleeds.   Eyes: Negative for blurred vision.  Respiratory: Positive for shortness of breath. Negative for cough, hemoptysis, sputum production and wheezing.   Cardiovascular: Negative for chest pain, palpitations, orthopnea and claudication.  Gastrointestinal: Negative for abdominal pain, blood in stool, constipation, diarrhea, heartburn, melena, nausea and vomiting.  Genitourinary: Negative for dysuria, flank pain, frequency, hematuria and urgency.  Musculoskeletal: Negative for back pain, joint pain and myalgias.  Skin: Negative for rash.  Neurological: Negative for dizziness, tingling, focal weakness, seizures, weakness and headaches.  Endo/Heme/Allergies: Does not bruise/bleed easily.  Psychiatric/Behavioral: Negative for depression and suicidal ideas. The patient does not have insomnia.     Allergies  Allergen Reactions  . Ivp Dye [Iodinated Diagnostic Agents]     Kidney failure requiring dialysis  . Clindamycin/Lincomycin Rash    Patient Active Problem List   Diagnosis Date Noted  . Chronic diastolic heart failure (HCC) 12/15/2016  . HTN (hypertension) 12/15/2016  . Fatigue 12/15/2016  . PVC (premature ventricular contraction) 11/24/2016  . 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  . Super obesity 11/24/2016  . Pulmonary emboli (HCC) 11/22/2016  . DVT (deep venous thrombosis) (HCC) 12/28/2014     Past Medical History:  Diagnosis Date  . Diabetes mellitus without complication (HCC)   .  DVT (deep venous thrombosis) (HCC)   . Hypertension   . Peripheral vascular disease (HCC)   . Presence of IVC filter      Past Surgical History:  Procedure Laterality Date  . PERIPHERAL VASCULAR CATHETERIZATION N/A 01/10/2015   Procedure:  Dialysis/Perma Catheter Insertion;  Surgeon: Renford Dills, MD;  Location: [facility] INVASIVE CV LAB;  Service: Cardiovascular;  Laterality: N/A;  . PERIPHERAL VASCULAR CATHETERIZATION N/A 02/24/2015   Procedure: Dialysis/Perma Catheter Removal;  Surgeon: Annice Needy, MD;  Location: [facility] INVASIVE CV LAB;  Service: Cardiovascular;  Laterality: N/A;    Social History   Social History  . Marital status: Married    Spouse name: N/A  . Number of children: N/A  . Years of education: N/A   Occupational History  . Not on file.   Social History Main Topics  . Smoking status: Never Smoker  . Smokeless tobacco: Never Used  . Alcohol use No  . Drug use: No  . Sexual activity: Not on file   Other Topics Concern  . Not on file   Social History Narrative  . No narrative on file     Family History  Problem Relation Age of Onset  . Hypertension Mother      Current Outpatient Prescriptions:  .  apixaban (ELIQUIS) 5 MG TABS tablet, Take 2 tablets (10 mg total) by mouth 2 (two) times daily., Disp: 75 tablet, Rfl: 0 .  hydrochlorothiazide (HYDRODIURIL) 25 MG tablet, Take 1 tablet (25 mg total) by mouth daily., Disp: 30 tablet, Rfl: 0 .  lisinopril (PRINIVIL,ZESTRIL) 10 MG tablet, Take 1 tablet (10 mg total) by mouth daily., Disp: 30 tablet, Rfl: 0 .  Vitamin D, Ergocalciferol, (DRISDOL) 50000 units CAPS capsule, Take 50,000 Units by mouth every 7 (seven) days., Disp: , Rfl:    Physical exam: There were no vitals filed for this visit. Physical Exam  Constitutional: He is oriented to person, place, and time.  Morbidly Obese, appears in no acute distress  HENT:  Head: Normocephalic and atraumatic.  Eyes: EOM are normal. Pupils are equal, round, and reactive to light.  Neck: Normal range of motion.  Cardiovascular: Normal rate, regular rhythm and normal heart sounds.   Pulmonary/Chest: Effort normal and breath sounds normal.  Abdominal: Soft. Bowel sounds are normal.  Lymphadenopathy:  LN  exam limited due to obesity. However no palpable cervical, supraclavicular, axillary or inguinal adenopathy noted  Neurological: He is alert and oriented to person, place, and time.  Skin: Skin is warm and dry.       CMP Latest Ref Rng & Units 11/23/2016  Glucose 65 - 99 mg/dL 413(K)  BUN 6 - 20 mg/dL 18  Creatinine 4.40 - 1.02 mg/dL 7.25(D)  Sodium 664 - 403 mmol/L 139  Potassium 3.5 - 5.1 mmol/L 4.1  Chloride 101 - 111 mmol/L 105  CO2 22 - 32 mmol/L 26  Calcium 8.9 - 10.3 mg/dL 9.2  Total Protein g/dL -  Total Bilirubin mg/dL -  Alkaline Phos U/L -  AST U/L -  ALT U/L -   CBC Latest Ref Rng & Units 11/24/2016  WBC 3.8 - 10.6 K/uL 4.4  Hemoglobin 13.0 - 18.0 g/dL 47.4  Hematocrit 25.9 - 52.0 % 45.5  Platelets 150 - 440 K/uL 261     Assessment and plan- Patient is a 46 y.o. male with recurrent DVT/PE in the past referred to [place] for anticoagulation management  Given that patient has recurrent DVT/PE, he would need lifelong anticoagulation regardless  of whether he has an underlying hypercoagulable condition or not. Factor V leiden was tested in the past and I will test him for prothrombin gene mutation and repeat his APLA work up. MTHFR mutations should not be tested for hypercoagulability and not not change anticoagulation decisions and hence I will not be checking that. I discussed various anticoagulation options with the patient today- 1. Lifelong lovenox which is difficult to administer lifelong but is reversible and levels can be measured. 2. Coumadin- higher risk of bleeding versus NOAC's but INR can be monitored and is readily reversible in bleeding cases. Does require compliance with diet and lab monitoring. 3. NOAC's- pradaxa, eliquis and xarelto. Only pradaxa has a reversal agent. Other 2 do not. International society of thrombosis recommends against using NOAC in morbidly obese patients as they have not been studies in those populations and levels cannot be measured and is the  same dose for everyone.  In this patient with morbid obesity I am concerned about using NOAC. Also patient reports headaches and Sob which he attributes to eliquis and would not like to continue with it. He is also concerned about lack of reversal agent. He would like to switch to coumadin and given his obesity, that may not be an unreasonable option. If he has problems with compliance or getting INR levels within range, we may not have an option but switch him to an alternative NOAC in the future. This needs to be done by his PCP as he will be the one monitoring his INR in the future. Recommend bridging with lovenox /kg. STOP eliquis and start lovenox when next dose of eliquis is due. Start coumadin a day later and stop lovenox when INR therapeutic on coumadin  Given that patient had inguinal adenopathy in the past hypermetabolic on PET in2016 and was not followed up, I will repeat CT abdomen without contrast at this time. 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 I will see him back after CT abdomen   Total face to face encounter time for this patient visit was 45 min. >50% of the time was  spent in counseling and coordination of care.     Thank you for this kind referral and the opportunity to participate in the care of this patient   Visit Diagnosis 1. 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 Other chronic pulmonary embolism without acute cor pulmonale (HCC)   3. Abnormal CT scan     Dr. Owens Shark, MD, MPH [facility] Pager- [phone] 12/23/2016  9:49 AM

## 2016-12-27 ENCOUNTER — Encounter: Payer: Self-pay | Admitting: Oncology

## 2016-12-28 DIAGNOSIS — I2699 Other pulmonary embolism without acute cor pulmonale: Secondary | ICD-10-CM

## 2016-12-28 HISTORY — DX: Other pulmonary embolism without acute cor pulmonale: I26.99

## 2016-12-28 LAB — BETA-2-GLYCOPROTEIN I ABS, IGG/M/A
Beta-2 Glyco I IgG: <9 GPI IgG units (ref 0–20)
Beta-2-Glycoprotein I IgA: <9 GPI IgA units (ref 0–25)

## 2016-12-28 LAB — HEX PHASE PHOSPHOLIPID REFLEX

## 2016-12-28 LAB — CARDIOLIPIN ANTIBODIES, IGG, IGM, IGA
Anticardiolipin IgA: <9 APL U/mL (ref 0–11)
Anticardiolipin IgM: <9 MPL U/mL (ref 0–12)

## 2016-12-28 LAB — PROTHROMBIN GENE MUTATION

## 2016-12-28 LAB — HEXAGONAL PHASE PHOSPHOLIPID: Hex Phosph Neut Test: 0 s (ref 0–11)

## 2016-12-31 ENCOUNTER — Other Ambulatory Visit: Payer: Self-pay | Admitting: Internal Medicine

## 2016-12-31 DIAGNOSIS — I208 Other forms of angina pectoris: Secondary | ICD-10-CM

## 2016-12-31 DIAGNOSIS — I2089 Other forms of angina pectoris: Secondary | ICD-10-CM

## 2016-12-31 DIAGNOSIS — R0602 Shortness of breath: Secondary | ICD-10-CM

## 2017-01-03 ENCOUNTER — Encounter: Payer: Self-pay | Admitting: Emergency Medicine

## 2017-01-03 ENCOUNTER — Other Ambulatory Visit: Payer: Self-pay

## 2017-01-03 ENCOUNTER — Inpatient Hospital Stay
Admission: EM | Admit: 2017-01-03 | Discharge: 2017-01-04 | DRG: 175 | Disposition: A | Discharge status: Home / Self Care | Payer: 59 | Attending: Internal Medicine | Admitting: Internal Medicine

## 2017-01-03 ENCOUNTER — Emergency Department: Payer: 59

## 2017-01-03 DIAGNOSIS — I10 Essential (primary) hypertension: Secondary | ICD-10-CM | POA: Diagnosis present

## 2017-01-03 DIAGNOSIS — Z86711 Personal history of pulmonary embolism: Secondary | ICD-10-CM | POA: Diagnosis not present

## 2017-01-03 DIAGNOSIS — I2699 Other pulmonary embolism without acute cor pulmonale: Secondary | ICD-10-CM | POA: Diagnosis not present

## 2017-01-03 DIAGNOSIS — Z881 Allergy status to other antibiotic agents status: Secondary | ICD-10-CM | POA: Diagnosis not present

## 2017-01-03 DIAGNOSIS — R7989 Other specified abnormal findings of blood chemistry: Secondary | ICD-10-CM | POA: Diagnosis not present

## 2017-01-03 DIAGNOSIS — I272 Pulmonary hypertension, unspecified: Secondary | ICD-10-CM | POA: Diagnosis present

## 2017-01-03 DIAGNOSIS — I5089 Other heart failure: Secondary | ICD-10-CM | POA: Diagnosis not present

## 2017-01-03 DIAGNOSIS — Z7901 Long term (current) use of anticoagulants: Secondary | ICD-10-CM | POA: Diagnosis not present

## 2017-01-03 DIAGNOSIS — Z79899 Other long term (current) drug therapy: Secondary | ICD-10-CM | POA: Diagnosis not present

## 2017-01-03 DIAGNOSIS — R778 Other specified abnormalities of plasma proteins: Secondary | ICD-10-CM

## 2017-01-03 DIAGNOSIS — Z6841 Body Mass Index (BMI) 40.0 and over, adult: Secondary | ICD-10-CM | POA: Diagnosis not present

## 2017-01-03 DIAGNOSIS — Z91041 Radiographic dye allergy status: Secondary | ICD-10-CM

## 2017-01-03 DIAGNOSIS — I248 Other forms of acute ischemic heart disease: Secondary | ICD-10-CM | POA: Diagnosis present

## 2017-01-03 DIAGNOSIS — J9601 Acute respiratory failure with hypoxia: Secondary | ICD-10-CM | POA: Diagnosis present

## 2017-01-03 DIAGNOSIS — Z86718 Personal history of other venous thrombosis and embolism: Secondary | ICD-10-CM | POA: Diagnosis not present

## 2017-01-03 DIAGNOSIS — Z9689 Presence of other specified functional implants: Secondary | ICD-10-CM | POA: Diagnosis present

## 2017-01-03 DIAGNOSIS — E1151 Type 2 diabetes mellitus with diabetic peripheral angiopathy without gangrene: Secondary | ICD-10-CM | POA: Diagnosis present

## 2017-01-03 DIAGNOSIS — Z8042 Family history of malignant neoplasm of prostate: Secondary | ICD-10-CM | POA: Diagnosis not present

## 2017-01-03 DIAGNOSIS — I2609 Other pulmonary embolism with acute cor pulmonale: Secondary | ICD-10-CM | POA: Diagnosis present

## 2017-01-03 DIAGNOSIS — Z803 Family history of malignant neoplasm of breast: Secondary | ICD-10-CM | POA: Diagnosis not present

## 2017-01-03 DIAGNOSIS — I2782 Chronic pulmonary embolism: Secondary | ICD-10-CM | POA: Diagnosis present

## 2017-01-03 LAB — CBC
HEMATOCRIT: 46 % (ref 40.0–52.0)
Hemoglobin: 15.4 g/dL (ref 13.0–18.0)
MCH: 30.6 pg (ref 26.0–34.0)
MCHC: 33.5 g/dL (ref 32.0–36.0)
MCV: 91.4 fL (ref 80.0–100.0)
Platelets: 247 10*3/uL (ref 150–440)
RBC: 5.04 MIL/uL (ref 4.40–5.90)
RDW: 14.6 % — ABNORMAL HIGH (ref 11.5–14.5)
WBC: 5.2 10*3/uL (ref 3.8–10.6)

## 2017-01-03 LAB — APTT
APTT: 27 s (ref 24–36)
APTT: 35 s (ref 24–36)

## 2017-01-03 LAB — HEPARIN LEVEL (UNFRACTIONATED): HEPARIN UNFRACTIONATED: 2.78 [IU]/mL — AB (ref 0.30–0.70)

## 2017-01-03 LAB — BASIC METABOLIC PANEL
Anion gap: 7 (ref 5–15)
BUN: 14 mg/dL (ref 6–20)
CHLORIDE: 106 mmol/L (ref 101–111)
CO2: 25 mmol/L (ref 22–32)
CREATININE: 1.57 mg/dL — AB (ref 0.61–1.24)
Calcium: 9.1 mg/dL (ref 8.9–10.3)
GFR calc non Af Amer: 51 mL/min — ABNORMAL LOW (ref >60)
GFR, EST AFRICAN AMERICAN: 59 mL/min — AB (ref >60)
Glucose, Bld: 119 mg/dL — ABNORMAL HIGH (ref 65–99)
POTASSIUM: 4.2 mmol/L (ref 3.5–5.1)
SODIUM: 138 mmol/L (ref 135–145)

## 2017-01-03 LAB — TROPONIN I
TROPONIN I: 0.06 ng/mL — AB (ref <0.03)
Troponin I: 0.04 ng/mL (ref <0.03)
Troponin I: 0.06 ng/mL (ref <0.03)

## 2017-01-03 LAB — PROTIME-INR
INR: 1.36
Prothrombin Time: 16.9 seconds — ABNORMAL HIGH (ref 11.4–15.2)

## 2017-01-03 LAB — BRAIN NATRIURETIC PEPTIDE: B NATRIURETIC PEPTIDE 5: 20 pg/mL (ref 0.0–100.0)

## 2017-01-03 MED ORDER — LISINOPRIL 10 MG PO TABS
10.0000 mg | ORAL_TABLET | Freq: Every day | ORAL | Status: DC
Start: 2017-01-04 — End: 2017-01-04
  Administered 2017-01-04: 10 mg via ORAL
  Filled 2017-01-03: qty 1

## 2017-01-03 MED ORDER — ACETAMINOPHEN 325 MG PO TABS
650.0000 mg | ORAL_TABLET | Freq: Four times a day (QID) | ORAL | Status: DC | PRN
Start: 2017-01-03 — End: 2017-01-04

## 2017-01-03 MED ORDER — ONDANSETRON HCL 4 MG PO TABS: 4.0000 mg | ORAL_TABLET | Freq: Four times a day (QID) | ORAL | Status: DC | PRN

## 2017-01-03 MED ORDER — ONDANSETRON HCL 4 MG/2ML IJ SOLN
4.0000 mg | Freq: Four times a day (QID) | INTRAMUSCULAR | Status: DC | PRN
  Administered 2017-01-03: 4 mg via INTRAVENOUS
  Filled 2017-01-03: qty 2

## 2017-01-03 MED ORDER — HYDROCHLOROTHIAZIDE 25 MG PO TABS
25.0000 mg | ORAL_TABLET | Freq: Every day | ORAL | Status: DC
Start: 2017-01-04 — End: 2017-01-04
  Administered 2017-01-04: 25 mg via ORAL
  Filled 2017-01-03: qty 1

## 2017-01-03 MED ORDER — HEPARIN BOLUS VIA INFUSION
4500.0000 [IU] | Freq: Once | INTRAVENOUS | Status: AC
  Administered 2017-01-03: 4500 [IU] via INTRAVENOUS
  Filled 2017-01-03: qty 4500

## 2017-01-03 MED ORDER — VITAMIN D (ERGOCALCIFEROL) 1.25 MG (50000 UNIT) PO CAPS
50000.0000 [IU] | ORAL_CAPSULE | ORAL | Status: DC
  Administered 2017-01-04: 50000 [IU] via ORAL
  Filled 2017-01-03: qty 1

## 2017-01-03 MED ORDER — HEPARIN (PORCINE) IN NACL 100-0.45 UNIT/ML-% IJ SOLN
1500.0000 [IU]/h | INTRAMUSCULAR | Status: DC
  Administered 2017-01-03: 1300 [IU]/h via INTRAVENOUS
  Administered 2017-01-04: 1400 [IU]/h via INTRAVENOUS
  Filled 2017-01-03 (×3): qty 250

## 2017-01-03 MED ORDER — ACETAMINOPHEN 650 MG RE SUPP: 650.0000 mg | Freq: Four times a day (QID) | RECTAL | Status: DC | PRN

## 2017-01-03 MED ORDER — IOPAMIDOL (ISOVUE-370) INJECTION 76%
75.0000 mL | Freq: Once | INTRAVENOUS | Status: AC | PRN
  Administered 2017-01-03: 75 mL via INTRAVENOUS

## 2017-01-03 NOTE — H&P (Signed)
[employer] - [place] at [facility]   PATIENT NAME: Ryan GaussCharles Eastman    MR#:  [id number]  DATE OF BIRTH:  [birth year]  DATE OF ADMISSION:  01/03/2017  PRIMARY CARE PHYSICIAN: Sherrie MustacheJadali, Fayegh   REQUESTING/REFERRING PHYSICIAN: Dr. Lamont Snowballifenbark  CHIEF COMPLAINT:   Chief Complaint  Patient presents with  . Shortness of Breath    HISTORY OF PRESENT ILLNESS:  Ryan [name]  is a 46 y.o. male with a known history of diabetes, hypertension, peripheral vascular disease, previous history of DVT and PE status post IVC filter who presents to the hospital due to shortness of breath. Patient says that he is short of breath on  Most of the times but recovers when he rests. Today when he walked up a flight of stairs he was significantly short of breath than usual and could not recover despite sitting down and resting and therefore called EMS and was brought to the ER for further evaluation. Patient underwent a CT scan of his chest with contrast which was positive for submassive pulmonary embolism on the right side. Hospitalist services were contacted further treatment and evaluation. Patient denies any hemoptysis, worsening lower extremity edema, leg pain, chest pain, nausea, vomiting or any other associated symptoms presently.  PAST MEDICAL HISTORY:   Past Medical History:  Diagnosis Date  . Diabetes mellitus without complication (HCC)    per pt-pre diabetic-Dr Dario GuardianJadali stated said pt  . DVT (deep venous thrombosis) (HCC)   . Hypertension   . Peripheral vascular disease (HCC)   . Presence of IVC filter     PAST SURGICAL HISTORY:   Past Surgical History:  Procedure Laterality Date  . PERIPHERAL VASCULAR CATHETERIZATION N/A 01/10/2015   Procedure: Dialysis/Perma Catheter Insertion;  Surgeon: Renford DillsGregory G Schnier, MD;  Location: [facility] INVASIVE CV LAB;  Service: Cardiovascular;  Laterality: N/A;  . PERIPHERAL VASCULAR CATHETERIZATION N/A 02/24/2015   Procedure: Dialysis/Perma Catheter Removal;   Surgeon: Annice NeedyJason S Dew, MD;  Location: [facility] INVASIVE CV LAB;  Service: Cardiovascular;  Laterality: N/A;    SOCIAL HISTORY:   Social History  Substance Use Topics  . Smoking status: Never Smoker  . Smokeless tobacco: Never Used  . Alcohol use No    FAMILY HISTORY:   Family History  Problem Relation Age of Onset  . Hypertension Mother   . Cancer Mother   . Breast cancer Mother   . Prostate cancer Father   . Hypertension Father   . Diabetes Father     DRUG ALLERGIES:   Allergies  Allergen Reactions  . Ivp Dye [Iodinated Diagnostic Agents]     Kidney failure requiring dialysis  . Clindamycin/Lincomycin Rash    REVIEW OF SYSTEMS:   Review of Systems  Constitutional: Negative for fever and weight loss.  HENT: Negative for congestion, nosebleeds and tinnitus.   Eyes: Negative for blurred vision, double vision and redness.  Respiratory: Positive for shortness of breath. Negative for cough and hemoptysis.   Cardiovascular: Negative for chest pain, orthopnea, leg swelling and PND.  Gastrointestinal: Negative for abdominal pain, diarrhea, melena, nausea and vomiting.  Genitourinary: Negative for dysuria, hematuria and urgency.  Musculoskeletal: Negative for falls and joint pain.  Neurological: Negative for dizziness, tingling, sensory change, focal weakness, seizures, weakness and headaches.  Endo/Heme/Allergies: Negative for polydipsia. Does not bruise/bleed easily.  Psychiatric/Behavioral: Negative for depression and memory loss. The patient is not nervous/anxious.     MEDICATIONS AT HOME:   Prior to Admission medications   Medication Sig Start Date End Date Taking?  Authorizing Provider  apixaban (ELIQUIS) 5 MG TABS tablet Take 2 tablets (10 mg total) by mouth 2 (two) times daily. 12/02/16 01/03/17 Yes Gouru, Deanna Artis, MD  hydrochlorothiazide (HYDRODIURIL) 25 MG tablet Take 1 tablet (25 mg total) by mouth daily. 11/24/16  Yes Gouru, Deanna Artis, MD  lisinopril (PRINIVIL,ZESTRIL) 10 MG  tablet Take 1 tablet (10 mg total) by mouth daily. 11/24/16  Yes Gouru, Deanna Artis, MD  Vitamin D, Ergocalciferol, (DRISDOL) 50000 units CAPS capsule Take 50,000 Units by mouth every 7 (seven) days.   Yes [provider]      VITAL SIGNS:  Blood pressure (!) 127/95, pulse 77, resp. rate 19, height 5\' 7"  (1.702 m), weight 119.3 kg (263 lb), SpO2 94 %.  PHYSICAL EXAMINATION:  Physical Exam  GENERAL:  46 y.o.-year-old obese patient lying in bed in no acute distress.  EYES: Pupils equal, round, reactive to light and accommodation. No scleral icterus. Extraocular muscles intact.  HEENT: Head atraumatic, normocephalic. Oropharynx and nasopharynx clear. No oropharyngeal erythema, moist oral mucosa  NECK:  Supple, no jugular venous distention. No thyroid enlargement, no tenderness.  LUNGS: Normal breath sounds bilaterally, no wheezing, rales, rhonchi. No use of accessory muscles of respiration.  CARDIOVASCULAR: S1, S2 RRR. No murmurs, rubs, gallops, clicks.  ABDOMEN: Soft, nontender, nondistended. Bowel sounds present. No organomegaly or mass.  EXTREMITIES: No pedal edema, cyanosis, or clubbing. + 2 pedal & radial pulses b/l.   NEUROLOGIC: Cranial nerves II through XII are intact. No focal Motor or sensory deficits appreciated b/l PSYCHIATRIC: The patient is alert and oriented x 3.   SKIN: No obvious rash, lesion, or ulcer.   LABORATORY PANEL:   CBC  Recent Labs Lab 01/03/17 1119  WBC 5.2  HGB 15.4  HCT 46.0  PLT 247   ------------------------------------------------------------------------------------------------------------------  Chemistries   Recent Labs Lab 01/03/17 1119  NA 138  K 4.2  CL 106  CO2 25  GLUCOSE 119*  BUN 14  CREATININE 1.57*  CALCIUM 9.1   ------------------------------------------------------------------------------------------------------------------  Cardiac Enzymes  Recent Labs Lab 01/03/17 1446  TROPONINI 0.06*    ------------------------------------------------------------------------------------------------------------------  RADIOLOGY:  Dg Chest 2 View  Result Date: 01/03/2017 CLINICAL DATA:  Shortness of Breath since 8 a.m. this morning. EXAM: CHEST  2 VIEW COMPARISON:  11/22/2016 FINDINGS: The cardiac silhouette, mediastinal and hilar contours are within normal limits and stable. Slightly prominent right pulmonary artery is a stable finding. No infiltrates, edema or effusions. The bony thorax is intact. IMPRESSION: No acute cardiopulmonary findings. Electronically Signed   By: Rudie Meyer M.D.   On: 01/03/2017 11:48   Ct Angio Chest Pe W/cm &/or Wo Cm  Addendum Date: 01/03/2017   ADDENDUM REPORT: 01/03/2017 16:22 ADDENDUM: Positive for acute PE with CTevidence of right heart strain (RV/LV Ratio = Positive for acute PE with CT evidence of right heart strain (RV/LV Ratio = 1.333) consistent with at least submassive (intermediate risk) PE. The presence of right heart strain has been associated with an increased risk of morbidity and mortality. Please activate Code PE by paging [phone].) consistent with at least submassive (intermediate risk) PE. The presence of right heart strain has been associated with an increased risk of morbidity and mortality. Electronically Signed   By: Sherian Rein M.D.   On: 01/03/2017 16:22   Result Date: 01/03/2017 CLINICAL DATA:  Shortness of breath for 8 weeks. Previous history of the venous thrombosis and pulmonary embolus. EXAM: CT ANGIOGRAPHY CHEST WITH CONTRAST TECHNIQUE: Multidetector CT imaging of the chest was performed using the  standard protocol during bolus administration of intravenous contrast. Multiplanar CT image reconstructions and MIPs were obtained to evaluate the vascular anatomy. CONTRAST:  75 mL of Isovue 370 COMPARISON:  December 20, 2014 FINDINGS: Cardiovascular: There is a large pulmonary embolus in the right main pulmonary artery with acute pulmonary  embolus involving bilateral multiple segmental and subsegmental pulmonary arteries of all lobes of bilateral lungs. The aorta is normal appearing. The heart size is normal. There is no pericardial effusion. Mediastinum/Nodes: No enlarged mediastinal, hilar, or axillary lymph nodes. Thyroid gland, trachea, and esophagus demonstrate no significant findings. Lungs/Pleura: No enlarged mediastinal, hilar, or axillary lymph nodes. Thyroid gland, trachea, and esophagus demonstrate no significant findings. Upper Abdomen: IVC filter is identified. There is diffuse fatty infiltration of liver. The other visualized upper abdominal structures are unremarkable. Musculoskeletal: No chest wall abnormality. No acute or significant osseous findings. Review of the MIP images confirms the above findings. IMPRESSION: Large pulmonary embolus in the right main pulmonary artery with acute pulmonary embolus involving bilateral multiple segmental and subsegmental pulmonary arteries of all lobes of bilateral lungs. These results will be called to the ordering clinician or representative by the Radiologist Assistant, and communication documented in the PACS or zVision Dashboard. Electronically Signed: By: Sherian Rein M.D. On: 01/03/2017 16:03     IMPRESSION AND PLAN:   46 year old male with past medical history of hypertension, diabetes, history of recurrent DVT/PE status post IVC filter, who presents to the hospital due to shortness of breath and noted to have an acute pulmonary embolism.  1. Acute respiratory failure with hypoxia-secondary to pulmonary embolism. Continue O2 supplementation, will treat underlying PE with heparin nomogram. Patient will likely need Coumadin upon discharge.  2. Recurrent PE/DVT-patient has a previous history of DVT and PE and is status post IVC filter in 2016. - he is currently on Eliquis.  He was seen by hematology oncology as an outpatient and there was a discussion of discontinuing his Eliquis  as it's not studied in the obese patients and is ineffective.  - plan was to switch him to Lovenox, Coumadin but it was not done yet.  He now comes in with right sided submassive PE.  -we'll start the patient on heparin drip. He will likely need to be switched over to Coumadin prior to discharge. - Consult hematology oncology and discussed the case with Dr. Orlie Dakin. 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 Patient will need to be on lifelong anticoagulation.  3. Essential hypertension-continue lisinopril, HCTZ.  4. Elevated troponin-demand ischemia secondary to hypoxemia and pulmonary embolism. - Observe on telemetry, cycle cardiac markers.   All the records are reviewed and case discussed with ED provider. Management plans discussed with the patient, family and they are in agreement.  CODE STATUS: Full Code  TOTAL TIME TAKING CARE OF THIS PATIENT: 45 minutes.    Houston Siren M.D on 01/03/2017 at 5:35 PM  Between 7am to 6pm - Pager - [phone]  After 6pm go to [email or web address] - password [email or web address] [facility]  [facility]  Office  [phone]  CC: Primary care physician; Sherrie Mustache

## 2017-01-03 NOTE — ED Notes (Signed)
Pt in CT.

## 2017-01-03 NOTE — ED Triage Notes (Signed)
Pt presents to ED c/o sob since 830 this morning. Pt used 2 L oxygen at home without relief. Pt states he is a little dizzy otherwise able to answer questions appropriately in triage. Denies heart failure; has been followed up by cardiologist, hematologist and pulmonologist for SOB

## 2017-01-03 NOTE — Progress Notes (Signed)
Pt arrived to room 137 via stretcher from ED. Pt alert and oriented X4. VSS. Skin assessment completed with Raynelle FanningJulie, RN. Cardiac monitor placed on pt 40-31. Pt on heparin drip 13 ml/hr verified with Raynelle FanningJulie. Dinner tray ordered, pt oriented to room and phone. Bed in lowest position and call bell within reach.

## 2017-01-03 NOTE — ED Notes (Signed)
Fleet Contrasachel RN verified Bolus and rate

## 2017-01-03 NOTE — Progress Notes (Addendum)
ANTICOAGULATION CONSULT NOTE - Initial Consult  Pharmacy Consult for heparin Indication: pulmonary embolus  Allergies  Allergen Reactions  . Ivp Dye [Iodinated Diagnostic Agents]     Kidney failure requiring dialysis  . Clindamycin/Lincomycin Rash    Patient Measurements: Height: 5\' 7"  (170.2 cm) Weight: 263 lb (119.3 kg) IBW/kg (Calculated) : 66.1 Heparin Dosing Weight: 93.6 kg  Vital Signs: BP: 145/99 (05/07 1446) Pulse Rate: 101 (05/07 1446)  Labs:  Recent Labs  01/03/17 1119 01/03/17 1446  HGB 15.4  --   HCT 46.0  --   PLT 247  --   CREATININE 1.57*  --   TROPONINI 0.04* 0.06*    Estimated Creatinine Clearance: 71.9 mL/min (A) (by C-G formula based on SCr of 1.57 mg/dL (H)).   Medical History: Past Medical History:  Diagnosis Date  . Diabetes mellitus without complication (HCC)    per pt-pre diabetic-Dr Dario GuardianJadali stated said pt  . DVT (deep venous thrombosis) (HCC)   . Hypertension   . Peripheral vascular disease (HCC)   . Presence of IVC filter     Medications:  Infusions:  . heparin      Assessment: 46 yom cc SOB with PE on CT. Patient has IVC filter in place and has been on Eliquis for history recurrent VTE most recently b/l DVT. Patient saw hematologist 12/23/16 and it looks like they stopped Eliquis secondary to headache and limited evidence in obeisty. They want to switch back to VKA which patient had previously stopped d/t frequency of blood work, but ask the the PCP does the conversion since INR will be through PCP. Hematology recommended LMWH to bridge from Eliquis to VKA until PCP can evaluate.   HL, aPTT, PT/INR ordered - dose adjustments will be made based on patient's current anticoagulant.   Goal of Therapy:  Heparin level 0.3-0.7 units/ml Monitor platelets by anticoagulation protocol: Yes   Plan:  Give 4500 units bolus x 1 Start heparin infusion at 1300 units/hr Check anti-Xa level in 6 hours and daily while on heparin Continue to  monitor H&H and platelets  Carola FrostNathan A Mykah Shin, Pharm.D., BCPS Clinical Pharmacist 01/03/2017,4:29 PM

## 2017-01-03 NOTE — ED Provider Notes (Signed)
[facility] Emergency Department Provider Note  ____________________________________________   First MD Initiated Contact with Patient 01/03/17 1503     (approximate)  I have reviewed the triage vital signs and the nursing notes.   HISTORY  Chief Complaint Shortness of Breath    HPI Ryan [name] is a 46 y.o. male who comes to the emergency department with 1 day of worsening exertional shortness of breath. He has a complex past medical history including morbid obesity, pulmonary hypertension, multiple pulmonary emboli, DVTs, and hypertension. He's had about 8 or 9 weeks of worsening shortness of breath that is different from previous. He has subsequently seen pulmonology, hematology, and cardiology and has not been given an etiology of his shortness of breath. Today he was climbing stairs to get to his office when he felt more winded than usual. Normally when he stops to rest he can regain his breath within a few minutes but this time after an hour on oxygen he could not fully regain his breath so she called 911. He denies chest pain.He does report several days where he was unable to take his Eliquis.   Past Medical History:  Diagnosis Date  . Diabetes mellitus without complication (HCC)    per pt-pre diabetic-Dr Dario Guardian stated said pt  . DVT (deep venous thrombosis) (HCC)   . Hypertension   . Peripheral vascular disease (HCC)   . Presence of IVC filter     Patient Active Problem List   Diagnosis Date Noted  . Pulmonary embolus (HCC) 01/03/2017  . Chronic diastolic heart failure (HCC) 12/15/2016  . HTN (hypertension) 12/15/2016  . Fatigue 12/15/2016  . PVC (premature ventricular contraction) 11/24/2016  . 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  . Super obesity 11/24/2016  . Pulmonary emboli (HCC) 11/22/2016  . DVT (deep venous thrombosis) (HCC) 12/28/2014    Past Surgical History:  Procedure Laterality Date  . PERIPHERAL VASCULAR CATHETERIZATION N/A  01/10/2015   Procedure: Dialysis/Perma Catheter Insertion;  Surgeon: Renford Dills, MD;  Location: [facility] INVASIVE CV LAB;  Service: Cardiovascular;  Laterality: N/A;  . PERIPHERAL VASCULAR CATHETERIZATION N/A 02/24/2015   Procedure: Dialysis/Perma Catheter Removal;  Surgeon: Annice Needy, MD;  Location: [facility] INVASIVE CV LAB;  Service: Cardiovascular;  Laterality: N/A;    Prior to Admission medications   Medication Sig Start Date End Date Taking? Authorizing Provider  apixaban (ELIQUIS) 5 MG TABS tablet Take 2 tablets (10 mg total) by mouth 2 (two) times daily. 12/02/16 01/03/17 Yes Gouru, Deanna Artis, MD  hydrochlorothiazide (HYDRODIURIL) 25 MG tablet Take 1 tablet (25 mg total) by mouth daily. 11/24/16  Yes Gouru, Deanna Artis, MD  lisinopril (PRINIVIL,ZESTRIL) 10 MG tablet Take 1 tablet (10 mg total) by mouth daily. 11/24/16  Yes Gouru, Deanna Artis, MD  Vitamin D, Ergocalciferol, (DRISDOL) 50000 units CAPS capsule Take 50,000 Units by mouth every 7 (seven) days.   Yes [provider]  enoxaparin (LOVENOX) 150 MG/ML injection Inject 1 mL (150 mg total) into the skin every 12 (twelve) hours. 01/05/17   Milagros Loll, MD  warfarin (COUMADIN) 3 MG tablet Take 2 tablets (6 mg total) by mouth one time only at 6 PM. 01/05/17   Milagros Loll, MD    Allergies Ivp dye [iodinated diagnostic agents] and Clindamycin/lincomycin  Family History  Problem Relation Age of Onset  . Hypertension Mother   . Cancer Mother   . Breast cancer Mother   . Prostate cancer Father   . Hypertension Father   . Diabetes Father  Social History Social History  Substance Use Topics  . Smoking status: Never Smoker  . Smokeless tobacco: Never Used  . Alcohol use No    Review of Systems Constitutional: No fever/chills Eyes: No visual changes. ENT: No sore throat. Cardiovascular: Denies chest pain. Respiratory: Positive shortness of breath. Gastrointestinal: No abdominal pain.  No nausea, no vomiting.  No diarrhea.  No  constipation. Genitourinary: Negative for dysuria. Musculoskeletal: Negative for back pain. Skin: Negative for rash. Neurological: Negative for headaches, focal weakness or numbness.  10-point ROS otherwise negative.  ____________________________________________   PHYSICAL EXAM:  VITAL SIGNS: ED Triage Vitals  Enc Vitals Group     BP 01/03/17 1446 (!) 145/99     Pulse Rate 01/03/17 1446 (!) 101     Resp 01/03/17 1446 (!) 24     Temp --      Temp src --      SpO2 01/03/17 1446 97 %     Weight 01/03/17 1118 263 lb (119.3 kg)     Height 01/03/17 1118 5\' 7"  (1.702 m)     Head Circumference --      Peak Flow --      Pain Score 01/03/17 1118 0     Pain Loc --      Pain Edu? --      Excl. in GC? --     Constitutional: Alert and oriented x 4 Morbidly obese appears out of breath while we are talking Eyes: PERRL EOMI. Head: Atraumatic. Nose: No congestion/rhinnorhea. Mouth/Throat: No trismus Neck: No stridor.   Cardiovascular: Normal rate, regular rhythm. Grossly normal heart sounds.  Good peripheral circulation. Respiratory: Increased respiratory effort.  No retractions. Lungs CTAB and moving good air Gastrointestinal: Morbidly obese nontender Musculoskeletal: Legs are equal in size but both are chronically swollen Neurologic:  Normal speech and language. No gross focal neurologic deficits are appreciated. Skin:  Skin is warm, dry and intact. No rash noted. Psychiatric: Mood and affect are normal. Speech and behavior are normal.    ____________________________________________   DIFFERENTIAL  Pulmonary hypertension, pulmonary embolism, acute coronary syndrome, pickwickian syndrome   LABS (all labs ordered are listed, but only abnormal results are displayed)  Labs Reviewed  BASIC METABOLIC PANEL - Abnormal; Notable for the following:       Result Value   Glucose, Bld 119 (*)    Creatinine, Ser 1.57 (*)    GFR calc non Af Amer 51 (*)    GFR calc Af Amer 59 (*)     All other components within normal limits  CBC - Abnormal; Notable for the following:    RDW 14.6 (*)    All other components within normal limits  TROPONIN I - Abnormal; Notable for the following:    Troponin I 0.04 (*)    All other components within normal limits  TROPONIN I - Abnormal; Notable for the following:    Troponin I 0.06 (*)    All other components within normal limits  PROTIME-INR - Abnormal; Notable for the following:    Prothrombin Time 16.9 (*)    All other components within normal limits  HEPARIN LEVEL (UNFRACTIONATED) - Abnormal; Notable for the following:    Heparin Unfractionated 2.78 (*)    All other components within normal limits  HEPARIN LEVEL (UNFRACTIONATED) - Abnormal; Notable for the following:    Heparin Unfractionated 2.04 (*)    All other components within normal limits  TROPONIN I - Abnormal; Notable for the following:    Troponin  I 0.06 (*)    All other components within normal limits  TROPONIN I - Abnormal; Notable for the following:    Troponin I 0.05 (*)    All other components within normal limits  TROPONIN I - Abnormal; Notable for the following:    Troponin I 0.05 (*)    All other components within normal limits  BASIC METABOLIC PANEL - Abnormal; Notable for the following:    Glucose, Bld 123 (*)    Creatinine, Ser 1.66 (*)    GFR calc non Af Amer 48 (*)    GFR calc Af Amer 55 (*)    All other components within normal limits  CBC - Abnormal; Notable for the following:    RDW 14.7 (*)    All other components within normal limits  HEPARIN LEVEL (UNFRACTIONATED) - Abnormal; Notable for the following:    Heparin Unfractionated 1.26 (*)    All other components within normal limits  BRAIN NATRIURETIC PEPTIDE  APTT  APTT  APTT  APTT    Slightly elevated troponin but relatively stable __________________________________________  EKG  ED ECG REPORT I, Merrily Brittle, the attending physician, personally viewed and interpreted this  ECG.  Date: 01/04/2017 Rate: 97 Rhythm: normal sinus rhythm QRS Axis: normal Intervals: normal ST/T Wave abnormalities: normal Conduction Disturbances: none Narrative Interpretation: unremarkable  ____________________________________________  RADIOLOGY  CT scan shows significant pulmonary embolism in all lobes of the lung with evidence of right heart strain ____________________________________________   PROCEDURES  Procedure(s) performed: no  Procedures  Critical Care performed: yes  CRITICAL CARE Performed by: Merrily Brittle   Total critical care time: 35 minutes  Critical care time was exclusive of separately billable procedures and treating other patients.  Critical care was necessary to treat or prevent imminent or life-threatening deterioration.  Critical care was time spent personally by me on the following activities: development of treatment plan with patient and/or surrogate as well as nursing, discussions with consultants, evaluation of patient's response to treatment, examination of patient, obtaining history from patient or surrogate, ordering and performing treatments and interventions, ordering and review of laboratory studies, ordering and review of radiographic studies, pulse oximetry and re-evaluation of patient's condition.   Observation: no ____________________________________________   INITIAL IMPRESSION / ASSESSMENT AND PLAN / ED COURSE  Pertinent labs & imaging results that were available during my care of the patient were reviewed by me and considered in my medical decision making (see chart for details).  The patient is saturating in the low 90s on nasal cannula and is relatively well-appearing although clearly short of breath. Differential is broad and includes pulmonary hypertension, acute coronary syndrome, and new pulmonary embolism. On chart review she has a long history of intermittent compliance with his Eliquis area I discussed the  risks and benefits of the CT scan with IV contrast the patient today and while he does report an allergy, on further questioning it is not an allergy but he developed some renal dysfunction secondary to multiple CT scans in close proximity. He consents to CT.  The patient's CT scan is significant for multiple acute pulmonary embolisms. The patient does have an IVC filter in place and this raises concern for inadequate anticoagulation either secondary to noncompliance versus Eliquis failure. This point he requires inpatient admission. I will place him on a heparin drip now.      ____________________________________________   FINAL CLINICAL IMPRESSION(S) / ED DIAGNOSES  Final diagnoses:  Other acute pulmonary embolism with acute cor pulmonale (HCC)  Elevated troponin      NEW MEDICATIONS STARTED DURING THIS VISIT:  Current Discharge Medication List    START taking these medications   Details  enoxaparin (LOVENOX) 150 MG/ML injection Inject 1 mL (150 mg total) into the skin every 12 (twelve) hours. Qty: 10 Syringe, Refills: 0    warfarin (COUMADIN) 3 MG tablet Take 2 tablets (6 mg total) by mouth one time only at 6 PM. Qty: 60 tablet, Refills: 0         Note:  This document was prepared using Dragon voice recognition software and may include unintentional dictation errors.     Merrily Brittleifenbark, Ranon Coven, MD 01/04/17 1451

## 2017-01-04 DIAGNOSIS — Z7901 Long term (current) use of anticoagulants: Secondary | ICD-10-CM

## 2017-01-04 DIAGNOSIS — R7989 Other specified abnormal findings of blood chemistry: Secondary | ICD-10-CM

## 2017-01-04 DIAGNOSIS — Z86711 Personal history of pulmonary embolism: Secondary | ICD-10-CM

## 2017-01-04 DIAGNOSIS — I10 Essential (primary) hypertension: Secondary | ICD-10-CM

## 2017-01-04 DIAGNOSIS — Z8042 Family history of malignant neoplasm of prostate: Secondary | ICD-10-CM

## 2017-01-04 DIAGNOSIS — Z803 Family history of malignant neoplasm of breast: Secondary | ICD-10-CM

## 2017-01-04 DIAGNOSIS — Z79899 Other long term (current) drug therapy: Secondary | ICD-10-CM

## 2017-01-04 DIAGNOSIS — I2699 Other pulmonary embolism without acute cor pulmonale: Secondary | ICD-10-CM

## 2017-01-04 DIAGNOSIS — I5089 Other heart failure: Secondary | ICD-10-CM

## 2017-01-04 DIAGNOSIS — Z86718 Personal history of other venous thrombosis and embolism: Secondary | ICD-10-CM

## 2017-01-04 LAB — BASIC METABOLIC PANEL
Anion gap: 6 (ref 5–15)
BUN: 15 mg/dL (ref 6–20)
CO2: 28 mmol/L (ref 22–32)
Calcium: 9 mg/dL (ref 8.9–10.3)
Chloride: 105 mmol/L (ref 101–111)
Creatinine, Ser: 1.66 mg/dL — ABNORMAL HIGH (ref 0.61–1.24)
GFR calc non Af Amer: 48 mL/min — ABNORMAL LOW (ref >60)
GFR, EST AFRICAN AMERICAN: 55 mL/min — AB (ref >60)
GLUCOSE: 123 mg/dL — AB (ref 65–99)
POTASSIUM: 4.1 mmol/L (ref 3.5–5.1)
Sodium: 139 mmol/L (ref 135–145)

## 2017-01-04 LAB — CBC
HEMATOCRIT: 43.6 % (ref 40.0–52.0)
HEMOGLOBIN: 14.4 g/dL (ref 13.0–18.0)
MCH: 30.4 pg (ref 26.0–34.0)
MCHC: 33 g/dL (ref 32.0–36.0)
MCV: 92 fL (ref 80.0–100.0)
Platelets: 241 10*3/uL (ref 150–440)
RBC: 4.74 MIL/uL (ref 4.40–5.90)
RDW: 14.7 % — ABNORMAL HIGH (ref 11.5–14.5)
WBC: 5.5 10*3/uL (ref 3.8–10.6)

## 2017-01-04 LAB — HEPARIN LEVEL (UNFRACTIONATED)
HEPARIN UNFRACTIONATED: 2.04 [IU]/mL — AB (ref 0.30–0.70)
HEPARIN UNFRACTIONATED: 1.26 [IU]/mL — AB (ref 0.30–0.70)

## 2017-01-04 LAB — APTT
APTT: 36 s (ref 24–36)
aPTT: 36 seconds (ref 24–36)

## 2017-01-04 LAB — TROPONIN I
Troponin I: 0.05 ng/mL (ref <0.03)
Troponin I: 0.05 ng/mL (ref <0.03)

## 2017-01-04 MED ORDER — ENOXAPARIN SODIUM 100 MG/ML ~~LOC~~ SOLN
1.0000 mg/kg | Freq: Two times a day (BID) | SUBCUTANEOUS | Status: DC
  Administered 2017-01-04: 165 mg via SUBCUTANEOUS
  Filled 2017-01-04: qty 2

## 2017-01-04 MED ORDER — HEPARIN BOLUS VIA INFUSION
3000.0000 [IU] | Freq: Once | INTRAVENOUS | Status: AC
  Administered 2017-01-04: 3000 [IU] via INTRAVENOUS
  Filled 2017-01-04: qty 3000

## 2017-01-04 MED ORDER — WARFARIN SODIUM 5 MG PO TABS
10.0000 mg | ORAL_TABLET | Freq: Once | ORAL | Status: AC
  Administered 2017-01-04: 10 mg via ORAL
  Filled 2017-01-04: qty 2

## 2017-01-04 MED ORDER — WARFARIN - PHARMACIST DOSING INPATIENT: Freq: Every day | Status: DC

## 2017-01-04 MED ORDER — HEPARIN BOLUS VIA INFUSION
3500.0000 [IU] | Freq: Once | INTRAVENOUS | Status: AC
  Administered 2017-01-04: 3500 [IU] via INTRAVENOUS
  Filled 2017-01-04: qty 3500

## 2017-01-04 MED ORDER — ENOXAPARIN SODIUM 150 MG/ML ~~LOC~~ SOLN: 150.0000 mg | Freq: Two times a day (BID) | SUBCUTANEOUS | 0 refills | Status: DC

## 2017-01-04 MED ORDER — WARFARIN SODIUM 3 MG PO TABS: 6.0000 mg | ORAL_TABLET | Freq: Once | ORAL | 0 refills | Status: DC

## 2017-01-04 NOTE — Progress Notes (Signed)
ANTICOAGULATION CONSULT NOTE - Initial Consult  Pharmacy Consult for warfarin  Indication: Pulmonary embolus   Allergies  Allergen Reactions  . Ivp Dye [Iodinated Diagnostic Agents]     Kidney failure requiring dialysis  . Clindamycin/Lincomycin Rash    Patient Measurements: Height: 5\' 7"  (170.2 cm) Weight: (!) 363 lb (164.7 kg) IBW/kg (Calculated) : 66.1 Heparin Dosing Weight:   Vital Signs: Temp: 97.8 F (36.6 C) (05/08 0813) Temp Source: Oral (05/08 0813) BP: 136/90 (05/08 0928) Pulse Rate: 102 (05/08 0928)  Labs:  Recent Labs  01/03/17 1119  01/03/17 1618 01/03/17 1820 01/03/17 2311 01/04/17 0122 01/04/17 0944  HGB 15.4  --   --   --   --  14.4  --   HCT 46.0  --   --   --   --  43.6  --   PLT 247  --   --   --   --  241  --   APTT  --   < > 27  --  35 36 36  LABPROT  --   --  16.9*  --   --   --   --   INR  --   --  1.36  --   --   --   --   HEPARINUNFRC  --   --  2.78*  --  2.04*  --  1.26*  CREATININE 1.57*  --   --   --   --  1.66*  --   TROPONINI 0.04*  < >  --  0.06* 0.05* 0.05*  --   < > = values in this interval not displayed.  Estimated Creatinine Clearance: 82.1 mL/min (A) (by C-G formula based on SCr of 1.66 mg/dL (H)).   Medical History: Past Medical History:  Diagnosis Date  . Diabetes mellitus without complication (HCC)    per pt-pre diabetic-Dr Dario GuardianJadali stated said pt  . DVT (deep venous thrombosis) (HCC)   . Hypertension   . Peripheral vascular disease (HCC)   . Presence of IVC filter     Medications:  Prescriptions Prior to Admission  Medication Sig Dispense Refill Last Dose  . apixaban (ELIQUIS) 5 MG TABS tablet Take 2 tablets (10 mg total) by mouth 2 (two) times daily. 75 tablet 0 01/03/2017 at 0800  . hydrochlorothiazide (HYDRODIURIL) 25 MG tablet Take 1 tablet (25 mg total) by mouth daily. 30 tablet 0 01/03/2017 at 0800  . lisinopril (PRINIVIL,ZESTRIL) 10 MG tablet Take 1 tablet (10 mg total) by mouth daily. 30 tablet 0 01/03/2017 at  0800  . Vitamin D, Ergocalciferol, (DRISDOL) 50000 units CAPS capsule Take 50,000 Units by mouth every 7 (seven) days.   12/27/2016 at am   Scheduled:  . enoxaparin (LOVENOX) injection  1 mg/kg Subcutaneous Q12H  . hydrochlorothiazide  25 mg Oral Daily  . lisinopril  10 mg Oral Daily  . Vitamin D (Ergocalciferol)  50,000 Units Oral Q7 days  . warfarin  10 mg Oral ONCE-1800  . Warfarin - Pharmacist Dosing Inpatient   Does not apply q1800    Assessment: Pharmacy consulted for warfarin. Patient previously on eliquis prior to admission. Patient has also been started on Lovenox transitioned from Heparin  Goal of Therapy:  INR 2-3    Plan:  Will give warfarin 10 mg PO x 1. Will recheck PT/INR am labs.    Lynette Noah D 01/04/2017,1:36 PM

## 2017-01-04 NOTE — Progress Notes (Signed)
Pt laying comfortably in bed. Pt denies SOB or CP. No change in pt from AM assessment. Pt denies pain. D/C teachings done both written and verbal. Pt verb understanding of all teachings and agrees to comply. Pt instructed verbally Lovenox administration. Pt able to return demonstrate proper technique for Lovenox administration. Pt given prescription for Lovenox and pt knows to pick up Coumadin at [facility] in [place]. Pt given Lovenox starter kit. Pt verbalizes understanding of s/sx CHF and emergency measures to take. Pt verbalizes s/sx of excessive anticoagulation (ie bleeding gums, rectal bleeding, easy bruising) and emergency measures to take. Pt instructed to follow up with Ryan [name] regarding coumadin monitoring per order and per discharge instructions. Pt verb understanding and agrees to apply.

## 2017-01-04 NOTE — Progress Notes (Signed)
Per order IV heparin gtt d/ced. Spoke with Hank pharmacist regarding pt received a 3500 unit heparin bolus per order at 1145. Pt to receive his Lovenox at 1800. Rept to Dr. Cherlynn KaiserSainani. Will continue to monitor.

## 2017-01-04 NOTE — Consult Note (Signed)
Hematology/Oncology Consult note [facility] [phone] [phone]  Patient Care Team: Sherrie MustacheJadali, Fayegh as PCP - General (Internal Medicine)   Name of the patient: Ryan GaussCharles [name]  [id number]  1971/07/17    Reason for consult- recurrent DVT/PE   Requesting physician: Dr. Elpidio AnisSudini  Date of visit: 01/04/2017    History of presenting illness- Patient is a 46 yr old male with prior h/o recurrent DVT and PE. Patient states that his 1st episode was when he around [age] yrs of age. I do see evidence of DVT and PE based on imaging inour system atleast adting back to 2006. He had taken coumadin in the past but had trouble coming for his INR checks consistently and was switched to eliquis. He states he has [facility] and did undergo some work up for hypecoaulability at that time which was apparently negatiev. States that he possibly has MTHFR mutation in his family. Not sure if he has it as well. He was recently admitted to the hospital in March 2018 with shortness of breath after not having taken eliquis for about 3 days. He had doppler of b/l LE which again revealed DVT but could not differentiate acute versus subacute or chronic. He had CXR and ECHO but no CTPE. He was discharged on eliquis. Patient also has a long standing IVC filter due to his recurrent DVT/PE. Patient has been seen by dr. Sherrlyn HockPandit from our practice in the past in 2016 for his DVT/PE. At that time he was found to have inguinal adenopathy. This was followed by PET/CT which showed hypermetabolic adenopathy in inguinal and iliac areas inflammatory versus malignant. He did not have further work up at that time. Factor V leiden and antiphospholipid antibody was done and was negative.  He was seen by me on 12/23/2016 to discuss anticoagulation recommendations. Given his morbid obesity I had recommended that he should be switched to Coumadin as there is no safety data treating use  eliquis in morbidly obese patients  Patient was admitted yesterday for symptoms of shortness of breath on exertion which did not resolve on rest. He underwent CT PE which showed acute PE with evidence of right heart strain. This was compared to prior CT scan from 2016        Review of systems- Review of Systems  Constitutional: Negative for chills, fever, malaise/fatigue and weight loss.  HENT: Negative for congestion, ear discharge and nosebleeds.   Eyes: Negative for blurred vision.  Respiratory: Positive for shortness of breath. Negative for cough, hemoptysis, sputum production and wheezing.   Cardiovascular: Negative for chest pain, palpitations, orthopnea and claudication.  Gastrointestinal: Negative for abdominal pain, blood in stool, constipation, diarrhea, heartburn, melena, nausea and vomiting.  Genitourinary: Negative for dysuria, flank pain, frequency, hematuria and urgency.  Musculoskeletal: Negative for back pain, joint pain and myalgias.  Skin: Negative for rash.  Neurological: Negative for dizziness, tingling, focal weakness, seizures, weakness and headaches.  Endo/Heme/Allergies: Does not bruise/bleed easily.  Psychiatric/Behavioral: Negative for depression and suicidal ideas. The patient does not have insomnia.     Allergies  Allergen Reactions  . Ivp Dye [Iodinated Diagnostic Agents]     Kidney failure requiring dialysis  . Clindamycin/Lincomycin Rash    Patient Active Problem List   Diagnosis Date Noted  . Pulmonary embolus (HCC) 01/03/2017  . Chronic diastolic heart failure (HCC) 12/15/2016  . HTN (hypertension) 12/15/2016  . Fatigue 12/15/2016  . PVC (premature ventricular contraction) 11/24/2016  . Ventricular  trigeminy 11/24/2016  . Super obesity 11/24/2016  . Pulmonary emboli (HCC) 11/22/2016  . DVT (deep venous thrombosis) (HCC) 12/28/2014     Past Medical History:  Diagnosis Date  . Diabetes mellitus without complication (HCC)    per pt-pre  diabetic-Dr Dario Guardian stated said pt  . DVT (deep venous thrombosis) (HCC)   . Hypertension   . Peripheral vascular disease (HCC)   . Presence of IVC filter      Past Surgical History:  Procedure Laterality Date  . PERIPHERAL VASCULAR CATHETERIZATION N/A 01/10/2015   Procedure: Dialysis/Perma Catheter Insertion;  Surgeon: Renford Dills, MD;  Location: [facility] INVASIVE CV LAB;  Service: Cardiovascular;  Laterality: N/A;  . PERIPHERAL VASCULAR CATHETERIZATION N/A 02/24/2015   Procedure: Dialysis/Perma Catheter Removal;  Surgeon: Annice Needy, MD;  Location: [facility] INVASIVE CV LAB;  Service: Cardiovascular;  Laterality: N/A;    Social History   Social History  . Marital status: Married    Spouse name: N/A  . Number of children: N/A  . Years of education: N/A   Occupational History  . Not on file.   Social History Main Topics  . Smoking status: Never Smoker  . Smokeless tobacco: Never Used  . Alcohol use No  . Drug use: No  . Sexual activity: Yes   Other Topics Concern  . Not on file   Social History Narrative  . No narrative on file     Family History  Problem Relation Age of Onset  . Hypertension Mother   . Cancer Mother   . Breast cancer Mother   . Prostate cancer Father   . Hypertension Father   . Diabetes Father      Current Facility-Administered Medications:  .  acetaminophen (TYLENOL) tablet 650 mg, 650 mg, Oral, Q6H PRN **OR** acetaminophen (TYLENOL) suppository 650 mg, 650 mg, Rectal, Q6H PRN, Sainani, Vivek J, MD .  enoxaparin (LOVENOX) injection 165 mg, 1 mg/kg, Subcutaneous, Q12H, Sudini, Srikar, MD .  hydrochlorothiazide (HYDRODIURIL) tablet 25 mg, 25 mg, Oral, Daily, Houston Siren, MD, 25 mg at 01/04/17 0926 .  lisinopril (PRINIVIL,ZESTRIL) tablet 10 mg, 10 mg, Oral, Daily, Houston Siren, MD, 10 mg at 01/04/17 0926 .  ondansetron (ZOFRAN) tablet 4 mg, 4 mg, Oral, Q6H PRN **OR** ondansetron (ZOFRAN) injection 4 mg, 4 mg, Intravenous, Q6H PRN, Houston Siren, MD, 4 mg at 01/03/17 2022 .  Vitamin D (Ergocalciferol) (DRISDOL) capsule 50,000 Units, 50,000 Units, Oral, Q7 days, Houston Siren, MD, 50,000 Units at 01/04/17 0925 .  warfarin (COUMADIN) tablet 10 mg, 10 mg, Oral, ONCE-1800, Sudini, Srikar, MD .  Warfarin - Pharmacist Dosing Inpatient, , Does not apply, q1800, Milagros Loll, MD   Physical exam:  Vitals:   01/03/17 2337 01/04/17 0813 01/04/17 0928 01/04/17 1202  BP: 125/65 (!) 108/55 136/90   Pulse: 91 99 (!) 102   Resp: 19 20    Temp: 98.8 F (37.1 C) 97.8 F (36.6 C)    TempSrc: Oral Oral    SpO2: 91% 94%    Weight:    (!) 363 lb (164.7 kg)  Height:       Physical Exam  Constitutional: He is oriented to person, place, and time.  Obese, in no acute distress  HENT:  Head: Normocephalic and atraumatic.  Eyes: EOM are normal. Pupils are equal, round, and reactive to light.  Neck: Normal range of motion.  Cardiovascular: Normal rate, regular rhythm and normal heart sounds.   Pulmonary/Chest: Effort normal and breath  sounds normal.  Abdominal: Soft. Bowel sounds are normal.  Neurological: He is alert and oriented to person, place, and time.  Skin: Skin is warm and dry.       CMP Latest Ref Rng & Units 01/04/2017  Glucose 65 - 99 mg/dL 409(W)  BUN 6 - 20 mg/dL 15  Creatinine 1.19 - 1.47 mg/dL 8.29(F)  Sodium 621 - 308 mmol/L 139  Potassium 3.5 - 5.1 mmol/L 4.1  Chloride 101 - 111 mmol/L 105  CO2 22 - 32 mmol/L 28  Calcium 8.9 - 10.3 mg/dL 9.0  Total Protein 6.5 - 8.1 g/dL -  Total Bilirubin 0.3 - 1.2 mg/dL -  Alkaline Phos 38 - 657 U/L -  AST 15 - 41 U/L -  ALT 17 - 63 U/L -   CBC Latest Ref Rng & Units 01/04/2017  WBC 3.8 - 10.6 K/uL 5.5  Hemoglobin 13.0 - 18.0 g/dL 84.6  Hematocrit 96.2 - 52.0 % 43.6  Platelets 150 - 440 K/uL 241    @IMAGES @  Dg Chest 2 View  Result Date: 01/03/2017 CLINICAL DATA:  Shortness of Breath since 8 a.m. this morning. EXAM: CHEST  2 VIEW COMPARISON:  11/22/2016 FINDINGS:  The cardiac silhouette, mediastinal and hilar contours are within normal limits and stable. Slightly prominent right pulmonary artery is a stable finding. No infiltrates, edema or effusions. The bony thorax is intact. IMPRESSION: No acute cardiopulmonary findings. Electronically Signed   By: Rudie Meyer M.D.   On: 01/03/2017 11:48   Ct Angio Chest Pe W/cm &/or Wo Cm  Addendum Date: 01/03/2017   ADDENDUM REPORT: 01/03/2017 16:22 ADDENDUM: Positive for acute PE with CTevidence of right heart strain (RV/LV Ratio = Positive for acute PE with CT evidence of right heart strain (RV/LV Ratio = 1.333) consistent with at least submassive (intermediate risk) PE. The presence of right heart strain has been associated with an increased risk of morbidity and mortality. Please activate Code PE by paging [phone].) consistent with at least submassive (intermediate risk) PE. The presence of right heart strain has been associated with an increased risk of morbidity and mortality. Electronically Signed   By: Sherian Rein M.D.   On: 01/03/2017 16:22   Result Date: 01/03/2017 CLINICAL DATA:  Shortness of breath for 8 weeks. Previous history of the venous thrombosis and pulmonary embolus. EXAM: CT ANGIOGRAPHY CHEST WITH CONTRAST TECHNIQUE: Multidetector CT imaging of the chest was performed using the standard protocol during bolus administration of intravenous contrast. Multiplanar CT image reconstructions and MIPs were obtained to evaluate the vascular anatomy. CONTRAST:  75 mL of Isovue 370 COMPARISON:  December 20, 2014 FINDINGS: Cardiovascular: There is a large pulmonary embolus in the right main pulmonary artery with acute pulmonary embolus involving bilateral multiple segmental and subsegmental pulmonary arteries of all lobes of bilateral lungs. The aorta is normal appearing. The heart size is normal. There is no pericardial effusion. Mediastinum/Nodes: No enlarged mediastinal, hilar, or axillary lymph nodes. Thyroid gland,  trachea, and esophagus demonstrate no significant findings. Lungs/Pleura: No enlarged mediastinal, hilar, or axillary lymph nodes. Thyroid gland, trachea, and esophagus demonstrate no significant findings. Upper Abdomen: IVC filter is identified. There is diffuse fatty infiltration of liver. The other visualized upper abdominal structures are unremarkable. Musculoskeletal: No chest wall abnormality. No acute or significant osseous findings. Review of the MIP images confirms the above findings. IMPRESSION: Large pulmonary embolus in the right main pulmonary artery with acute pulmonary embolus involving bilateral multiple segmental and subsegmental pulmonary arteries of all lobes  of bilateral lungs. These results will be called to the ordering clinician or representative by the Radiologist Assistant, and communication documented in the PACS or zVision Dashboard. Electronically Signed: By: Sherian Rein M.D. On: 01/03/2017 16:03    Assessment and plan- Patient is a 46 y.o. male with recurrent DVT and PE now admitted with acute PE and right heart strain  Patient needs lifelong anticoagulation given his recurrent episodes of DVT and pulmonary embolism. Presence of an IVC filter does not prevent another PE necessarily as he can happen he no voiding the absence of DVT and IVC filter at times can serve as a nidus for clo patient was on Eliquis at the time of hospitalization. I would once again recommend that his Eliquis should be stopped as there is no way to monitor the dose and there is no safety data for adequate dosing and morbidly obese patients. I would recommend that he should be bridged with Lovenox and discharged on Coumadin which needs to be followed up by his primary care doctor DR. Dario Guardian to keep his INR between 2-3. I will see him in my clinic about 4 weeks from now as planned prior.    Thank you for this kind referral and the opportunity to participate in the care of this patient   Visit  Diagnosis 1. Other acute pulmonary embolism with acute cor pulmonale (HCC)   2. Elevated troponin     Dr. Owens Shark, MD, MPH [facility] at [facility] Pager- [phone] 01/04/2017 4:57 PM

## 2017-01-04 NOTE — Progress Notes (Signed)
ANTICOAGULATION CONSULT NOTE - FOLLOW UP   Pharmacy Consult for heparin Indication: pulmonary embolus       Allergies  Allergen Reactions  . Ivp Dye [Iodinated Diagnostic Agents]     Kidney failure requiring dialysis  . Clindamycin/Lincomycin Rash    Patient Measurements: Height: 5\' 7"  (170.2 cm) Weight: 263 lb (119.3 kg) IBW/kg (Calculated) : 66.1 Heparin Dosing Weight: 93.6 kg  Vital Signs: BP: 145/99 (05/07 1446) Pulse Rate: 101 (05/07 1446)  Labs:  Recent Labs (last 2 labs)    Recent Labs  01/03/17 1119 01/03/17 1446  HGB 15.4  --   HCT 46.0  --   PLT 247  --   CREATININE 1.57*  --   TROPONINI 0.04* 0.06*      Estimated Creatinine Clearance: 71.9 mL/min (A) (by C-G formula based on SCr of 1.57 mg/dL (H)).   Medical History:     Past Medical History:  Diagnosis Date  . Diabetes mellitus without complication (HCC)    per pt-pre diabetic-Dr Dario GuardianJadali stated said pt  . DVT (deep venous thrombosis) (HCC)   . Hypertension   . Peripheral vascular disease (HCC)   . Presence of IVC filter     Medications:  Infusions:  . heparin      Assessment: 46 yom cc SOB with PE on CT. Patient has IVC filter in place and has been on Eliquis for history recurrent VTE most recently b/l DVT. Patient saw hematologist 12/23/16 and it looks like they stopped Eliquis secondary to headache and limited evidence in obeisty. They want to switch back to VKA which patient had previously stopped d/t frequency of blood work, but ask the the PCP does the conversion since INR will be through PCP. Hematology recommended LMWH to bridge from Eliquis to VKA until PCP can evaluate.   HL, aPTT, PT/INR ordered - dose adjustments will be made based on patient's current anticoagulant.   Goal of Therapy:  Heparin level 0.3-0.7 units/ml Monitor platelets by anticoagulation protocol: Yes   Plan:  Give 4500 units bolus x 1 Start heparin infusion at 1300 units/hr Check  anti-Xa level in 6 hours and daily while on heparin Continue to monitor H&H and platelets  5/7 @ 2311 HL 2.04, aPTT 35; since HL and aPTT do not correlate will dose based on aPTT -- aPTT currently subtherapeutic (goal 66 - 108 sec). Will rebolus w/ heparin IV 3000 units x 1 and will increase rate to 1400 units/hr and recheck HL/aPTT @ 0930. H/H currently stable.  5/8: Heparin level still slightly elevated; however trending downward. APTT =36, which is subtherapeutic. Will give Heparin bolus of 3500. Will also increase heparin gtt 1500 units/hr.   Thank you for this consult.  Demetrius Charityeldrin D. Quention Mcneill, PharmD  Clinical Pharmacist 01/04/2017

## 2017-01-04 NOTE — Discharge Instructions (Signed)
Please follow up closely with your doctor for coumadin monitoring

## 2017-01-04 NOTE — Progress Notes (Signed)
Pt discharged to home without incident per MD order via wheelchair accompanied by family member. Pt has discharge paperwork in hand.

## 2017-01-04 NOTE — Progress Notes (Signed)
ANTICOAGULATION CONSULT NOTE - Initial Consult  Pharmacy Consult for heparin Indication: pulmonary embolus       Allergies  Allergen Reactions  . Ivp Dye [Iodinated Diagnostic Agents]     Kidney failure requiring dialysis  . Clindamycin/Lincomycin Rash    Patient Measurements: Height: 5\' 7"  (170.2 cm) Weight: 263 lb (119.3 kg) IBW/kg (Calculated) : 66.1 Heparin Dosing Weight: 93.6 kg  Vital Signs: BP: 145/99 (05/07 1446) Pulse Rate: 101 (05/07 1446)  Labs:  Recent Labs (last 2 labs)    Recent Labs  01/03/17 1119 01/03/17 1446  HGB 15.4  --   HCT 46.0  --   PLT 247  --   CREATININE 1.57*  --   TROPONINI 0.04* 0.06*      Estimated Creatinine Clearance: 71.9 mL/min (A) (by C-G formula based on SCr of 1.57 mg/dL (H)).   Medical History:     Past Medical History:  Diagnosis Date  . Diabetes mellitus without complication (HCC)    per pt-pre diabetic-Dr Dario GuardianJadali stated said pt  . DVT (deep venous thrombosis) (HCC)   . Hypertension   . Peripheral vascular disease (HCC)   . Presence of IVC filter     Medications:  Infusions:  . heparin      Assessment: 46 yom cc SOB with PE on CT. Patient has IVC filter in place and has been on Eliquis for history recurrent VTE most recently b/l DVT. Patient saw hematologist 12/23/16 and it looks like they stopped Eliquis secondary to headache and limited evidence in obeisty. They want to switch back to VKA which patient had previously stopped d/t frequency of blood work, but ask the the PCP does the conversion since INR will be through PCP. Hematology recommended LMWH to bridge from Eliquis to VKA until PCP can evaluate.   HL, aPTT, PT/INR ordered - dose adjustments will be made based on patient's current anticoagulant.   Goal of Therapy:  Heparin level 0.3-0.7 units/ml Monitor platelets by anticoagulation protocol: Yes   Plan:  Give 4500 units bolus x 1 Start heparin infusion at 1300  units/hr Check anti-Xa level in 6 hours and daily while on heparin Continue to monitor H&H and platelets  5/7 @ 2311 HL 2.04, aPTT 35; since HL and aPTT do not correlate will dose based on aPTT -- aPTT currently subtherapeutic (goal 66 - 108 sec). Will rebolus w/ heparin IV 3000 units x 1 and will increase rate to 1400 units/hr and recheck HL/aPTT @ 0930. H/H currently stable.  Thank you for this consult.  Thomasene Rippleavid Dmoni Fortson, PharmD, BCPS Clinical Pharmacist 01/04/2017

## 2017-01-04 NOTE — Care Management (Signed)
Cost of Lovenox is $ 40 . Patient denies problems paying for medication.

## 2017-01-06 ENCOUNTER — Ambulatory Visit: Payer: 59

## 2017-01-07 ENCOUNTER — Other Ambulatory Visit: Payer: 59

## 2017-01-13 ENCOUNTER — Ambulatory Visit
Admission: RE | Admit: 2017-01-13 | Discharge: 2017-01-13 | Disposition: A | Discharge status: Home / Self Care | Payer: 59 | Source: Ambulatory Visit | Attending: Internal Medicine | Admitting: Internal Medicine

## 2017-01-13 DIAGNOSIS — R0602 Shortness of breath: Secondary | ICD-10-CM | POA: Diagnosis present

## 2017-01-13 DIAGNOSIS — I208 Other forms of angina pectoris: Secondary | ICD-10-CM | POA: Diagnosis present

## 2017-01-13 MED ORDER — REGADENOSON 0.4 MG/5ML IV SOLN
0.4000 mg | Freq: Once | INTRAVENOUS | Status: AC
  Administered 2017-01-13: 0.4 mg via INTRAVENOUS
  Filled 2017-01-13: qty 5

## 2017-01-13 MED ORDER — TECHNETIUM TC 99M TETROFOSMIN IV KIT
30.0000 | PACK | Freq: Once | INTRAVENOUS | Status: AC | PRN
  Administered 2017-01-13: 32.475 via INTRAVENOUS

## 2017-01-14 ENCOUNTER — Encounter
Admission: RE | Admit: 2017-01-14 | Discharge: 2017-01-14 | Disposition: A | Discharge status: Home / Self Care | Payer: 59 | Source: Ambulatory Visit | Attending: Internal Medicine | Admitting: Internal Medicine

## 2017-01-14 ENCOUNTER — Ambulatory Visit: Payer: 59 | Admitting: Family

## 2017-01-14 DIAGNOSIS — I208 Other forms of angina pectoris: Secondary | ICD-10-CM | POA: Diagnosis not present

## 2017-01-14 MED ORDER — TECHNETIUM TC 99M TETROFOSMIN IV KIT
29.0900 | PACK | Freq: Once | INTRAVENOUS | Status: AC | PRN
  Administered 2017-01-14: 29.09 via INTRAVENOUS

## 2017-01-17 NOTE — Discharge Summary (Signed)
[facility] [place] [facility]   PATIENT NAME: Ryan [name]    MR#:  [id number]  DATE OF BIRTH:  [birth year]  DATE OF ADMISSION:  01/03/2017 ADMITTING PHYSICIAN: Houston SirenVivek J Sainani, MD  DATE OF DISCHARGE: 01/04/2017  6:50 PM  PRIMARY CARE PHYSICIAN: Sherrie MustacheJadali, Fayegh   ADMISSION DIAGNOSIS:  Elevated troponin [R74.8] Other acute pulmonary embolism with acute cor pulmonale (HCC) [I26.09]  DISCHARGE DIAGNOSIS:  Active Problems:   Pulmonary embolus (HCC)   SECONDARY DIAGNOSIS:   Past Medical History:  Diagnosis Date  . Diabetes mellitus without complication (HCC)    per pt-pre diabetic-Dr Dario GuardianJadali stated said pt  . DVT (deep venous thrombosis) (HCC)   . Hypertension   . Peripheral vascular disease (HCC)   . Presence of IVC filter      ADMITTING HISTORY  HISTORY OF PRESENT ILLNESS:  Ryan [name]  is a 46 y.o. male with a known history of diabetes, hypertension, peripheral vascular disease, previous history of DVT and PE status post IVC filter who presents to the hospital due to shortness of breath. Patient says that he is short of breath on  Most of the times but recovers when he rests. Today when he walked up a flight of stairs he was significantly short of breath than usual and could not recover despite sitting down and resting and therefore called EMS and was brought to the ER for further evaluation. Patient underwent a CT scan of his chest with contrast which was positive for submassive pulmonary embolism on the right side. Hospitalist services were contacted further treatment and evaluation. Patient denies any hemoptysis, worsening lower extremity edema, leg pain, chest pain, nausea, vomiting or any other associated symptoms presently.   HOSPITAL COURSE:   * Right-sided pulmonary embolism Patient has been on Xarelto. Follows at the cancer center by Dr. Smith Robertao. After discussing with oncology patient's Xarelto has been switched to Coumadin with Lovenox bridging for 5  days. Prescriptions given. Seen by oncology in the hospital. Patient has chronic shortness of breath which is stable. Not needing oxygen. No chest pain. His Coumadin dose will be adjusted by his primary care physician. Follow-up with Dr. Smith Robertao in the office in one to weeks.  Stable for discharge home  CONSULTS OBTAINED:  Treatment Team:  Creig Hinesao, Archana C, MD  DRUG ALLERGIES:   Allergies  Allergen Reactions  . Ivp Dye [Iodinated Diagnostic Agents]     Kidney failure requiring dialysis  . Clindamycin/Lincomycin Rash    DISCHARGE MEDICATIONS:   Discharge Medication List as of 01/04/2017  2:23 PM    START taking these medications   Details  enoxaparin (LOVENOX) 150 MG/ML injection Inject 1 mL (150 mg total) into the skin every 12 (twelve) hours., Starting Wed 01/05/2017, Print    warfarin (COUMADIN) 3 MG tablet Take 2 tablets (6 mg total) by mouth one time only at 6 PM., Starting Wed 01/05/2017, Normal      CONTINUE these medications which have NOT CHANGED   Details  hydrochlorothiazide (HYDRODIURIL) 25 MG tablet Take 1 tablet (25 mg total) by mouth daily., Starting Wed 11/24/2016, Print    lisinopril (PRINIVIL,ZESTRIL) 10 MG tablet Take 1 tablet (10 mg total) by mouth daily., Starting Wed 11/24/2016, Print    Vitamin D, Ergocalciferol, (DRISDOL) 50000 units CAPS capsule Take 50,000 Units by mouth every 7 (seven) days., Historical Med      STOP taking these medications     apixaban (ELIQUIS) 5 MG TABS tablet  Today   VITAL SIGNS:  Blood pressure 122/64, pulse 91, temperature 97.8 F (36.6 C), temperature source Oral, resp. rate 18, height 5\' 7"  (1.702 m), weight (!) 164.7 kg (363 lb), SpO2 93 %.  I/O:  No intake or output data in the 24 hours ending 01/17/17 1328  PHYSICAL EXAMINATION:  Physical Exam  GENERAL:  46 y.o.-year-old patient lying in the bed. Morbidly obese LUNGS: Normal breath sounds bilaterally, no wheezing, rales,rhonchi or crepitation. No use of  accessory muscles of respiration.  CARDIOVASCULAR: S1, S2 normal. No murmurs, rubs, or gallops.  ABDOMEN: Soft, non-tender, non-distended. Bowel sounds present. No organomegaly or mass.  NEUROLOGIC: Moves all 4 extremities. PSYCHIATRIC: The patient is alert and oriented x 3.   DATA REVIEW:   CBC No results for input(s): WBC, HGB, HCT, PLT in the last 168 hours.  Chemistries  No results for input(s): NA, K, CL, CO2, GLUCOSE, BUN, CREATININE, CALCIUM, MG, AST, ALT, ALKPHOS, BILITOT in the last 168 hours.  Invalid input(s): GFRCGP  Cardiac Enzymes No results for input(s): TROPONINI in the last 168 hours.  Microbiology Results  Results for orders placed or performed during the hospital encounter of 12/24/14  Culture, blood (single)     Status: None (Preliminary result)   Collection Time: 12/27/14  8:34 PM  Result Value Ref Range Status   Micro Text Report   Preliminary       COMMENT                   NO GROWTH IN 36 HOURS   ANTIBIOTIC                                                      Culture, blood (single)     Status: None (Preliminary result)   Collection Time: 12/27/14  8:40 PM  Result Value Ref Range Status   Micro Text Report   Preliminary       COMMENT                   NO GROWTH IN 36 HOURS   ANTIBIOTIC                                                        RADIOLOGY:  No results found.  Follow up with PCP in 1 week.  Management plans discussed with the patient, family and they are in agreement.  CODE STATUS:  Code Status History    Date Active Date Inactive Code Status Order ID Comments User Context   01/03/2017  6:39 PM 01/04/2017  9:56 PM Full Code [id number]  Houston Siren, MD Inpatient   11/22/2016  4:51 PM 11/24/2016  9:25 PM Full Code [id number]  Shaune Pollack, MD Inpatient   12/28/2014 11:45 PM 01/15/2015 10:11 PM Full Code [id number]  Gasper Sells, RN Inpatient      TOTAL TIME TAKING CARE OF THIS PATIENT ON DAY OF DISCHARGE: more than 30 minutes.    Milagros Loll R M.D on 01/17/2017 at 1:28 PM  Between 7am to 6pm - Pager - [phone]  After 6pm go to [email or web address] - password [email or web address]  [facility] [place] [facility]  Office  [phone]  CC: Primary care physician; Sherrie Mustache  Note: This dictation was prepared with Dragon dictation along with smaller phrase technology. Any transcriptional errors that result from this process are unintentional.

## 2017-01-18 ENCOUNTER — Ambulatory Visit: Payer: 59 | Attending: Family | Admitting: Family

## 2017-01-18 ENCOUNTER — Encounter: Payer: Self-pay | Admitting: Family

## 2017-01-18 VITALS — BP 150/96 | HR 96 | Resp 20 | Ht 67.0 in | Wt 356.5 lb

## 2017-01-18 DIAGNOSIS — Z8042 Family history of malignant neoplasm of prostate: Secondary | ICD-10-CM | POA: Diagnosis not present

## 2017-01-18 DIAGNOSIS — I110 Hypertensive heart disease with heart failure: Secondary | ICD-10-CM | POA: Diagnosis not present

## 2017-01-18 DIAGNOSIS — Z803 Family history of malignant neoplasm of breast: Secondary | ICD-10-CM | POA: Insufficient documentation

## 2017-01-18 DIAGNOSIS — Z7901 Long term (current) use of anticoagulants: Secondary | ICD-10-CM | POA: Diagnosis not present

## 2017-01-18 DIAGNOSIS — E669 Obesity, unspecified: Secondary | ICD-10-CM | POA: Diagnosis not present

## 2017-01-18 DIAGNOSIS — R05 Cough: Secondary | ICD-10-CM | POA: Insufficient documentation

## 2017-01-18 DIAGNOSIS — Z833 Family history of diabetes mellitus: Secondary | ICD-10-CM | POA: Insufficient documentation

## 2017-01-18 DIAGNOSIS — Z881 Allergy status to other antibiotic agents status: Secondary | ICD-10-CM | POA: Diagnosis not present

## 2017-01-18 DIAGNOSIS — R5383 Other fatigue: Secondary | ICD-10-CM | POA: Insufficient documentation

## 2017-01-18 DIAGNOSIS — Z8249 Family history of ischemic heart disease and other diseases of the circulatory system: Secondary | ICD-10-CM | POA: Diagnosis not present

## 2017-01-18 DIAGNOSIS — Z86711 Personal history of pulmonary embolism: Secondary | ICD-10-CM | POA: Insufficient documentation

## 2017-01-18 DIAGNOSIS — I825Z3 Chronic embolism and thrombosis of unspecified deep veins of distal lower extremity, bilateral: Secondary | ICD-10-CM

## 2017-01-18 DIAGNOSIS — I5032 Chronic diastolic (congestive) heart failure: Secondary | ICD-10-CM | POA: Diagnosis present

## 2017-01-18 DIAGNOSIS — Z91041 Radiographic dye allergy status: Secondary | ICD-10-CM | POA: Diagnosis not present

## 2017-01-18 DIAGNOSIS — I2699 Other pulmonary embolism without acute cor pulmonale: Secondary | ICD-10-CM | POA: Insufficient documentation

## 2017-01-18 DIAGNOSIS — Z809 Family history of malignant neoplasm, unspecified: Secondary | ICD-10-CM | POA: Diagnosis not present

## 2017-01-18 DIAGNOSIS — Z9889 Other specified postprocedural states: Secondary | ICD-10-CM | POA: Insufficient documentation

## 2017-01-18 DIAGNOSIS — I739 Peripheral vascular disease, unspecified: Secondary | ICD-10-CM | POA: Diagnosis not present

## 2017-01-18 DIAGNOSIS — R5382 Chronic fatigue, unspecified: Secondary | ICD-10-CM

## 2017-01-18 DIAGNOSIS — Z86718 Personal history of other venous thrombosis and embolism: Secondary | ICD-10-CM | POA: Insufficient documentation

## 2017-01-18 DIAGNOSIS — Z6841 Body Mass Index (BMI) 40.0 and over, adult: Secondary | ICD-10-CM | POA: Diagnosis not present

## 2017-01-18 DIAGNOSIS — E1151 Type 2 diabetes mellitus with diabetic peripheral angiopathy without gangrene: Secondary | ICD-10-CM | POA: Diagnosis not present

## 2017-01-18 DIAGNOSIS — I10 Essential (primary) hypertension: Secondary | ICD-10-CM

## 2017-01-18 NOTE — Patient Instructions (Signed)
Continue weighing daily and call for an overnight weight gain of > 2 pounds or a weekly weight gain of >5 pounds. 

## 2017-01-18 NOTE — Progress Notes (Signed)
Patient ID: Ryan [name], male    DOB: 09-24-1970, 46 y.o.   MRN: [id number]  HPI  Ryan [name] is a 46 y/o male with a history of diabetes, DVT, HTN, PVD, obesity and chronic heart failure.   Reviewed last echo done on 11/23/16 which showed an EF of 60-65% without any valvular regurgitation.   Admitted 01/03/17 with right-sided pulmonary embolism. Anticoagulant switched to warfarin with lovenox bridging for 5 days. INR to be followed by PCP. Discharged home. Admitted 11/22/16 due to hypoxia with a PMH of pulmonary embolus and had been off eliquis for the last 3 days. Medications were adjusted  He presents today for his follow-up visit with a chief complaint of moderate fatigue with moderate exertion. He describes his fatigue as chronic in nature and has been present for many months with waxing/waning of severity. He has associated cough along with this.   Past Medical History:  Diagnosis Date  . Diabetes mellitus without complication (HCC)    per pt-pre diabetic-Dr Ryan Guardian stated said pt  . DVT (deep venous thrombosis) (HCC)   . Hypertension   . Peripheral vascular disease (HCC)   . Presence of IVC filter    Past Surgical History:  Procedure Laterality Date  . PERIPHERAL VASCULAR CATHETERIZATION N/A 01/10/2015   Procedure: Dialysis/Perma Catheter Insertion;  Surgeon: Ryan Dills, Ryan [name];  Location: [facility] INVASIVE CV LAB;  Service: Cardiovascular;  Laterality: N/A;  . PERIPHERAL VASCULAR CATHETERIZATION N/A 02/24/2015   Procedure: Dialysis/Perma Catheter Removal;  Surgeon: Ryan Needy, Ryan [name];  Location: [facility] INVASIVE CV LAB;  Service: Cardiovascular;  Laterality: N/A;   Family History  Problem Relation Age of Onset  . Hypertension Mother   . Cancer Mother   . Breast cancer Mother   . Prostate cancer Father   . Hypertension Father   . Diabetes Father    Social History  Substance Use Topics  . Smoking status: Never Smoker  . Smokeless tobacco: Never Used  . Alcohol use No   Allergies   Allergen Reactions  . Ivp Dye [Iodinated Diagnostic Agents]     Kidney failure requiring dialysis  . Clindamycin/Lincomycin Rash   Prior to Admission medications   Medication Sig Start Date End Date Taking? Authorizing Provider  enoxaparin (LOVENOX) 150 MG/ML injection Inject 1 mL (150 mg total) into the skin every 12 (twelve) hours. 01/05/17  Yes Ryan [name], Ryan Heath, Ryan [name]  hydrochlorothiazide (HYDRODIURIL) 25 MG tablet Take 1 tablet (25 mg total) by mouth daily. 11/24/16  Yes Ryan [name], Ryan Artis, Ryan [name]  lisinopril (PRINIVIL,ZESTRIL) 10 MG tablet Take 1 tablet (10 mg total) by mouth daily. 11/24/16  Yes Ryan [name], Ryan Artis, Ryan [name]  Vitamin D, Ergocalciferol, (DRISDOL) 50000 units CAPS capsule Take 50,000 Units by mouth every 7 (seven) days.   Yes Provider, Historical, Ryan [name]  warfarin (COUMADIN) 3 MG tablet Take 2 tablets (6 mg total) by mouth one time only at 6 PM. Patient taking differently: Take 8 mg by mouth one time only at 6 PM.  01/05/17  Yes Ryan Loll, Ryan [name]      Review of Systems  Constitutional: Positive for fatigue. Negative for appetite change.  HENT: Positive for congestion. Negative for postnasal drip and sore throat.   Eyes: Negative.   Respiratory: Positive for cough. Negative for chest tightness and shortness of breath.   Cardiovascular: Negative for chest pain, palpitations and leg swelling.  Gastrointestinal: Negative for abdominal distention and abdominal pain.  Endocrine: Negative.   Genitourinary: Negative.   Musculoskeletal: Negative for back pain and neck  pain.  Skin: Negative.   Allergic/Immunologic: Negative.   Neurological: Negative for dizziness and light-headedness.  Hematological: Negative for adenopathy. Does not bruise/bleed easily.  Psychiatric/Behavioral: Negative for dysphoric mood and sleep disturbance. The patient is not nervous/anxious.    Vitals:   01/18/17 1211  BP: (!) 150/96  Pulse: 96  Resp: 20  SpO2: 97%  Weight: (!) 356 lb 8 oz (161.7 kg)  Height: 5\' 7"  (1.702 m)    Wt Readings from Last 3 Encounters:  01/18/17 (!) 356 lb 8 oz (161.7 kg)  01/04/17 (!) 363 lb (164.7 kg)  12/23/16 (!) 366 lb 3.2 oz (166.1 kg)    Lab Results  Component Value Date   CREATININE 1.66 (H) 01/04/2017   CREATININE 1.57 (H) 01/03/2017   CREATININE 1.71 (H) 12/23/2016   Physical Exam  Constitutional: He is oriented to person, place, and time. He appears well-developed and well-nourished.  HENT:  Head: Normocephalic and atraumatic.  Neck: Normal range of motion. Neck supple. No JVD present.  Cardiovascular: Normal rate and regular rhythm.   Pulmonary/Chest: Effort normal. He has no wheezes. He has no rales.  Abdominal: Soft. He exhibits no distension. There is no tenderness.  Musculoskeletal: He exhibits no edema or tenderness.  Neurological: He is alert and oriented to person, place, and time.  Skin: Skin is warm and dry.  Psychiatric: He has a normal mood and affect. His behavior is normal.  Nursing note and vitals reviewed.  Assessment & Plan:  1: Chronic heart failure with preserved ejection fraction- - NYHA class III - euvolemic - weighing daily. Weight down 13.8 pounds by our scale since he was last here. Reminded to call for an overnight weight gain of >2 pounds or a weekly weight gain of >5 pounds - not adding salt and the importance of following a 2000mg  sodium diet was reviewed.  - unsure of fluid intake. Discussed keeping his daily fluid intake between 40-50 ounces of fluid a day - saw cardiologist Ryan Pares([place]) 12/27/16  2: HTN- - BP minimally elevated today. 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 Each medication was verbally reviewed with the patient and he was encouraged to bring the bottles to every visit to confirm accuracy of list.  Return in 3 months or sooner for any questions/problems before then.

## 2017-01-19 ENCOUNTER — Encounter: Payer: Self-pay | Admitting: Family

## 2017-02-11 LAB — NM MYOCAR MULTI W/SPECT W/WALL MOTION / EF
CHL CUP NUCLEAR SRS: 0
CHL CUP RESTING HR STRESS: 83 {beats}/min
CSEPHR: 60 %
CSEPPHR: 105 {beats}/min
Estimated workload: 1 METS
Exercise duration (min): 1 min
Exercise duration (sec): 1 s
LVDIAVOL: 82 mL (ref 62–150)
LVSYSVOL: 41 mL
MPHR: 173 {beats}/min
SDS: 1
SSS: 0
TID: 0.94

## 2017-02-22 ENCOUNTER — Other Ambulatory Visit: Payer: Self-pay | Admitting: *Deleted

## 2017-02-22 DIAGNOSIS — R9389 Abnormal findings on diagnostic imaging of other specified body structures: Secondary | ICD-10-CM

## 2017-02-22 DIAGNOSIS — I825Z3 Chronic embolism and thrombosis of unspecified deep veins of distal lower extremity, bilateral: Secondary | ICD-10-CM

## 2017-02-22 DIAGNOSIS — I2782 Chronic pulmonary embolism: Secondary | ICD-10-CM

## 2017-02-23 ENCOUNTER — Ambulatory Visit
Admission: RE | Admit: 2017-02-23 | Discharge: 2017-02-23 | Disposition: A | Discharge status: Home / Self Care | Payer: 59 | Source: Ambulatory Visit | Attending: Oncology | Admitting: Oncology

## 2017-02-23 DIAGNOSIS — I825Z3 Chronic embolism and thrombosis of unspecified deep veins of distal lower extremity, bilateral: Secondary | ICD-10-CM | POA: Diagnosis present

## 2017-02-23 DIAGNOSIS — K760 Fatty (change of) liver, not elsewhere classified: Secondary | ICD-10-CM | POA: Diagnosis not present

## 2017-02-23 DIAGNOSIS — K5730 Diverticulosis of large intestine without perforation or abscess without bleeding: Secondary | ICD-10-CM | POA: Insufficient documentation

## 2017-02-23 DIAGNOSIS — R590 Localized enlarged lymph nodes: Secondary | ICD-10-CM | POA: Diagnosis not present

## 2017-02-23 DIAGNOSIS — I2782 Chronic pulmonary embolism: Secondary | ICD-10-CM | POA: Diagnosis present

## 2017-02-23 DIAGNOSIS — R9389 Abnormal findings on diagnostic imaging of other specified body structures: Secondary | ICD-10-CM

## 2017-02-23 DIAGNOSIS — K429 Umbilical hernia without obstruction or gangrene: Secondary | ICD-10-CM | POA: Insufficient documentation

## 2017-02-23 DIAGNOSIS — R938 Abnormal findings on diagnostic imaging of other specified body structures: Secondary | ICD-10-CM | POA: Insufficient documentation

## 2017-02-25 ENCOUNTER — Inpatient Hospital Stay: Payer: 59 | Attending: Oncology | Admitting: Oncology

## 2017-02-25 ENCOUNTER — Encounter (INDEPENDENT_AMBULATORY_CARE_PROVIDER_SITE_OTHER): Payer: Self-pay

## 2017-02-25 VITALS — BP 156/79 | HR 99 | Temp 99.8°F | Resp 18 | Wt 363.5 lb

## 2017-02-25 DIAGNOSIS — R590 Localized enlarged lymph nodes: Secondary | ICD-10-CM | POA: Insufficient documentation

## 2017-02-25 DIAGNOSIS — I509 Heart failure, unspecified: Secondary | ICD-10-CM | POA: Diagnosis not present

## 2017-02-25 DIAGNOSIS — E1151 Type 2 diabetes mellitus with diabetic peripheral angiopathy without gangrene: Secondary | ICD-10-CM | POA: Diagnosis not present

## 2017-02-25 DIAGNOSIS — K760 Fatty (change of) liver, not elsewhere classified: Secondary | ICD-10-CM | POA: Insufficient documentation

## 2017-02-25 DIAGNOSIS — Z7901 Long term (current) use of anticoagulants: Secondary | ICD-10-CM | POA: Diagnosis not present

## 2017-02-25 DIAGNOSIS — Z86711 Personal history of pulmonary embolism: Secondary | ICD-10-CM | POA: Insufficient documentation

## 2017-02-25 DIAGNOSIS — R51 Headache: Secondary | ICD-10-CM | POA: Insufficient documentation

## 2017-02-25 DIAGNOSIS — I110 Hypertensive heart disease with heart failure: Secondary | ICD-10-CM | POA: Diagnosis not present

## 2017-02-25 DIAGNOSIS — Z79899 Other long term (current) drug therapy: Secondary | ICD-10-CM | POA: Diagnosis not present

## 2017-02-25 DIAGNOSIS — K429 Umbilical hernia without obstruction or gangrene: Secondary | ICD-10-CM

## 2017-02-25 DIAGNOSIS — I2782 Chronic pulmonary embolism: Secondary | ICD-10-CM | POA: Diagnosis not present

## 2017-02-25 DIAGNOSIS — K5730 Diverticulosis of large intestine without perforation or abscess without bleeding: Secondary | ICD-10-CM | POA: Diagnosis not present

## 2017-02-25 DIAGNOSIS — I82509 Chronic embolism and thrombosis of unspecified deep veins of unspecified lower extremity: Secondary | ICD-10-CM | POA: Diagnosis not present

## 2017-02-25 DIAGNOSIS — Z86718 Personal history of other venous thrombosis and embolism: Secondary | ICD-10-CM | POA: Diagnosis not present

## 2017-02-25 NOTE — Progress Notes (Signed)
Patient states he has not had his BP medication today.  He usually takes it at night.  Temp today in left ear 99.8 and 100.3 in right ear.  Patient states he had a sinus headache last evening.  Does not feel like he is sick or coming down with anything.  Patients here for lab results.

## 2017-02-26 ENCOUNTER — Encounter: Payer: Self-pay | Admitting: Oncology

## 2017-02-26 NOTE — Progress Notes (Signed)
Hematology/Oncology Consult note [facility]  [phone] [phone]  Patient Care Team: Sherrie MustacheJadali, Fayegh, MD as PCP - General (Internal Medicine)   Name of the patient: Jena GaussCharles Daughtrey  [id number]  [birth year]   Date of visit: 02/26/17  Diagnosis-recurrent DVT/PE  Chief complaint/ Reason for visit- discuss CT abdomen results  Heme/Onc history:  Patient is a 46 yr old male with prior h/o recurrent DVT and PE. Patient states that his 1st episode was when he around [age] yrs of age. I do see evidence of DVT and PE based on imaging inour system atleast adting back to 2006. He had taken coumadin in the past but had trouble coming for his INR checks consistently and was switched to eliquis. He states [facility] and did undergo some work up for hypecoaulability at that time which was apparently negatiev. States that he possibly has MTHFR mutation in his family. Not sure if he has it as well. He was recently admitted to the hospital in March 2018 with shortness of breath after not having taken eliquis for about 3 days. He had doppler of b/l LE which again revealed DVT but could not differentiate acute versus subacute or chronic. He had CXR and ECHO but no CTPE. He was discharged on eliquis. Patient also has a long standing IVC filter due to his recurrent DVT/PE. Patient has been seen by dr. Sherrlyn HockPandit from our practice in the past in 2016 for his DVT/PE. At that time he was found to have inguinal adenopathy. This was followed by PET/CT which showed hypermetabolic adenopathy in inguinal and iliac areas inflammatory versus malignant. He did not have further work up at that time. Dr. Marilynn Railpanditote states factor V leiden and antiphospholipid antibody was done and was negative.  Currently patient states that he has been taking his eliquis daily but reports frequent headaches and also feels his SOB is worse since he has started taking eliquis and  would not like to continue eliquis. This was discussed at prior visit and after weighing pros and cons of different anticoagulants, patient chose to go back to coumadin given his morbid obesity   Interval history- he is currently on 9 mg coumadin and tolerating it well. INR being monitored by DR. Dario GuardianJadali and has been between 2-3 since 2 weeks per patient  ECOG PS- 2  Review of systems- Review of Systems  Constitutional: Negative for chills, fever, malaise/fatigue and weight loss.  HENT: Negative for congestion, ear discharge and nosebleeds.   Eyes: Negative for blurred vision.  Respiratory: Negative for cough, hemoptysis, sputum production, shortness of breath and wheezing.   Cardiovascular: Negative for chest pain, palpitations, orthopnea and claudication.  Gastrointestinal: Negative for abdominal pain, blood in stool, constipation, diarrhea, heartburn, melena, nausea and vomiting.  Genitourinary: Negative for dysuria, flank pain, frequency, hematuria and urgency.  Musculoskeletal: Negative for back pain, joint pain and myalgias.  Skin: Negative for rash.  Neurological: Negative for dizziness, tingling, focal weakness, seizures, weakness and headaches.  Endo/Heme/Allergies: Does not bruise/bleed easily.  Psychiatric/Behavioral: Negative for depression and suicidal ideas. The patient does not have insomnia.      Allergies  Allergen Reactions  . Ivp Dye [Iodinated Diagnostic Agents]     Kidney failure requiring dialysis  . Clindamycin/Lincomycin Rash  . Sulfa Antibiotics Rash     Past Medical History:  Diagnosis Date  . CHF (congestive heart failure) (HCC)   . Diabetes mellitus without complication (HCC)  per pt-pre diabetic-Dr Dario Guardian stated said pt  . DVT (deep venous thrombosis) (HCC)   . Hypertension   . Peripheral vascular disease (HCC)   . Presence of IVC filter      Past Surgical History:  Procedure Laterality Date  . PERIPHERAL VASCULAR CATHETERIZATION N/A  01/10/2015   Procedure: Dialysis/Perma Catheter Insertion;  Surgeon: Renford Dills, MD;  Location: [facility] INVASIVE CV LAB;  Service: Cardiovascular;  Laterality: N/A;  . PERIPHERAL VASCULAR CATHETERIZATION N/A 02/24/2015   Procedure: Dialysis/Perma Catheter Removal;  Surgeon: Annice Needy, MD;  Location: [facility] INVASIVE CV LAB;  Service: Cardiovascular;  Laterality: N/A;    Social History   Social History  . Marital status: Married    Spouse name: N/A  . Number of children: N/A  . Years of education: N/A   Occupational History  . Not on file.   Social History Main Topics  . Smoking status: Never Smoker  . Smokeless tobacco: Never Used  . Alcohol use No  . Drug use: No  . Sexual activity: Yes   Other Topics Concern  . Not on file   Social History Narrative  . No narrative on file    Family History  Problem Relation Age of Onset  . Hypertension Mother   . Cancer Mother   . Breast cancer Mother   . Prostate cancer Father   . Hypertension Father   . Diabetes Father      Current Outpatient Prescriptions:  .  hydrochlorothiazide (HYDRODIURIL) 25 MG tablet, Take 1 tablet (25 mg total) by mouth daily., Disp: 30 tablet, Rfl: 0 .  lisinopril (PRINIVIL,ZESTRIL) 10 MG tablet, Take 1 tablet (10 mg total) by mouth daily., Disp: 30 tablet, Rfl: 0 .  Vitamin D, Ergocalciferol, (DRISDOL) 50000 units CAPS capsule, Take 50,000 Units by mouth every 7 (seven) days., Disp: , Rfl:  .  warfarin (COUMADIN) 1 MG tablet, Take 1 mg by mouth daily., Disp: , Rfl: 1 .  warfarin (COUMADIN) 3 MG tablet, Take 2 tablets (6 mg total) by mouth one time only at 6 PM. (Patient taking differently: Take 8 mg by mouth one time only at 6 PM. ), Disp: 60 tablet, Rfl: 0 .  enoxaparin (LOVENOX) 150 MG/ML injection, Inject 1 mL (150 mg total) into the skin every 12 (twelve) hours. (Patient not taking: Reported on 02/25/2017), Disp: 10 Syringe, Rfl: 0  Physical exam:  Vitals:   02/25/17 1524  BP: (!) 156/79    Pulse: 99  Resp: 18  Temp: 99.8 F (37.7 C)  TempSrc: Tympanic  Weight: (!) 363 lb 8 oz (164.9 kg)   Physical Exam  Constitutional: He is oriented to person, place, and time.  Morbid obesity. He does not appear to be in acute distress  HENT:  Head: Normocephalic and atraumatic.  Eyes: EOM are normal. Pupils are equal, round, and reactive to light.  Neck: Normal range of motion.  Cardiovascular: Normal rate, regular rhythm and normal heart sounds.   Pulmonary/Chest: Effort normal and breath sounds normal.  Abdominal: Soft. Bowel sounds are normal.  Difficult to palpate inguinal adenopathy  Neurological: He is alert and oriented to person, place, and time.  Skin: Skin is warm and dry.     CMP Latest Ref Rng & Units 01/04/2017  Glucose 65 - 99 mg/dL 696(E)  BUN 6 - 20 mg/dL 15  Creatinine 9.52 - 8.41 mg/dL 3.24(M)  Sodium 010 - 272 mmol/L 139  Potassium 3.5 - 5.1 mmol/L 4.1  Chloride 101 - 111  mmol/L 105  CO2 22 - 32 mmol/L 28  Calcium 8.9 - 10.3 mg/dL 9.0  Total Protein 6.5 - 8.1 g/dL -  Total Bilirubin 0.3 - 1.2 mg/dL -  Alkaline Phos 38 - 161 U/L -  AST 15 - 41 U/L -  ALT 17 - 63 U/L -   CBC Latest Ref Rng & Units 01/04/2017  WBC 3.8 - 10.6 K/uL 5.5  Hemoglobin 13.0 - 18.0 g/dL 09.6  Hematocrit 04.5 - 52.0 % 43.6  Platelets 150 - 440 K/uL 241       Ct Abdomen Pelvis Wo Contrast  Result Date: 02/23/2017 CLINICAL DATA:  Follow-up bilateral inguinal adenopathy. EXAM: CT ABDOMEN AND PELVIS WITHOUT CONTRAST TECHNIQUE: Multidetector CT imaging of the abdomen and pelvis was performed following the standard protocol without IV contrast. COMPARISON:  None. FINDINGS: Lower chest: Lung bases are clear. No effusions. Heart is normal size. Hepatobiliary: Fatty infiltration of the liver. No focal abnormality or biliary ductal dilatation. Gallbladder unremarkable. Pancreas: No focal abnormality or ductal dilatation. Spleen: No focal abnormality.  Normal size. Adrenals/Urinary Tract:  No adrenal abnormality. No focal renal abnormality. No stones or hydronephrosis. Urinary bladder is unremarkable. Stomach/Bowel: Colonic diverticulosis, most pronounced in the sigmoid colon. No active diverticulitis. Appendix is normal. Stomach and small bowel decompressed, unremarkable. Vascular/Lymphatic: No evidence of aneurysm. Two IVC filters in place. The limbs of the inferior IVC filter extend outside the IVC walls. Enlarged inguinal lymph nodes are again noted, 2.8 cm in short axis diameter on the right and 2.2 cm in short axis diameter on the left, slightly larger than prior study. External iliac adenopathy bilaterally with a short axis diameter of 1.5 cm on the right and 1.5 cm on the left, stable mildly prominent inferior periaortic lymph nodes, 11 mm posterior to the distal aorta on image 52, stable. Reproductive: No visible focal abnormality. Other: No free fluid or free air. Umbilical hernia containing fat. Soft tissue stranding in the subcutaneous anterior abdominal wall, likely from subcutaneous injections. Musculoskeletal: No acute bony abnormality. IMPRESSION: Colonic diverticulosis.  No active diverticulitis. Fatty liver. Mildly enlarged external iliac and bilateral inguinal lymph nodes, stable or slightly increased in size since prior study. Umbilical hernia containing fat. Electronically Signed   By: Charlett Nose M.D.   On: 02/23/2017 09:51     Assessment and plan- Patient is a 46 y.o. male who sees me for following issues  1. Recurrent DVT/PE- discussed results of hypercoagulable work up including factor V leiden, prothrombin gene mutation and antiphospholipid antibody testing which were negative. He needs to be on lifelong anticoagulation regardless given recurrent DVT/PE  2. Inguinal adenopathy- inguinal adenopathy persists but is stable since 2016. Malignancy unlikely and may be reactive. Will reviwe his images with radiology at tumor and discuss if biopsy is needed. I will call the  patient after tumor board.  Patient can continue to follow up with DR. Dario Guardian at this point. I will see him again if biopsy of the inguinal LN is planned   Visit Diagnosis 1. 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 Owens Shark, MD, MPH [facility] at [facility] Pager- [phone] 02/26/2017 3:24 PM

## 2017-03-03 ENCOUNTER — Other Ambulatory Visit: Payer: Self-pay | Admitting: Oncology

## 2017-03-03 ENCOUNTER — Telehealth: Payer: Self-pay | Admitting: *Deleted

## 2017-03-03 DIAGNOSIS — R590 Localized enlarged lymph nodes: Secondary | ICD-10-CM

## 2017-03-03 NOTE — Progress Notes (Unsigned)
Case discussed at tumor board. Prior CT, PET Ct and recent ct abdomen reviewed. He does have stable scattered intra abdominal adenopathy including inguinal adenopathy. Will proceed with US biopsy of inguinal LN at this time and I will see him 3-4 weeks from now  Dr. Owens SharkArchana Rao, MD, MPH [facility] [phone] 03/03/2017 3:42 PM

## 2017-03-03 NOTE — Telephone Encounter (Signed)
Called patient to discuss need for biopsy, voiced understanding. Patient informed that he would get a call from another department with the information.

## 2017-03-04 NOTE — Progress Notes (Signed)
Erica from Dr. Aurelio BrashJadali's office reported that she asked Dr. Dario GuardianJadali and they were seeing the patient on 7-13 to check his INR and make a decision about coming off of coumadin for the procedure.  Will follow up with Alcario DroughtErica after Fridays appt.

## 2017-03-04 NOTE — Progress Notes (Signed)
He would need to stop coumadin 5 days prior. He will need bridging lovenox prior to procedure

## 2017-03-07 ENCOUNTER — Other Ambulatory Visit: Payer: Self-pay | Admitting: Oncology

## 2017-03-07 NOTE — Telephone Encounter (Signed)
We dont renew his coumadin. Dr. Dario GuardianJadali his pcp manages it

## 2017-03-14 ENCOUNTER — Telehealth: Payer: Self-pay | Admitting: *Deleted

## 2017-03-14 NOTE — Telephone Encounter (Signed)
Called pt on 7/12 and 7/13 about his bx. Date of 7/18. I spoke to pt on 7/13 and he said that he can't do wed because of work.  I called specials and specialty scheduling and left messages about r/s the date and he can't do wed.  [place] from [place] today and she can do 7/24 arrive at 7:30 for 8:30 procedure. Called pt and spoke to him. He is ok with new date and time.  I spoke with him about NPO for 8 hours prior to bx.  Sips of water only to take meds, have a driver to take you home and the following instructions with coumadin and lovenox: last dose of coumadin 7/19, start lovenox bid 7/20, then 7/21, 7/22.  On 7/23 take am dose of lovenox only. 7/24 do not take lovenox that am but he will restart lovenox and coumadin the evening of his procedure.  He will stay on the coumadin and lovenox after procedure until he gets PT/INR lab done with Dr. Dario GuardianJadali office to determine if his coumadin level  INR is 2.0 and then he will cont. Coumadin and stop lovenox. Pt will callDr. Dario GuardianJadali and get lab appt. Dr. Dario GuardianJadali has been notified about this and I will call to make them aware the appt date

## 2017-03-16 ENCOUNTER — Ambulatory Visit: Admission: RE | Admit: 2017-03-16 | Payer: 59 | Source: Ambulatory Visit

## 2017-03-22 ENCOUNTER — Ambulatory Visit: Admission: RE | Admit: 2017-03-22 | Payer: 59 | Source: Ambulatory Visit

## 2017-04-21 ENCOUNTER — Ambulatory Visit: Payer: 59 | Admitting: Family

## 2017-04-26 ENCOUNTER — Ambulatory Visit: Payer: 59 | Admitting: Family

## 2017-04-26 ENCOUNTER — Telehealth: Payer: Self-pay | Admitting: Family

## 2017-04-26 NOTE — Telephone Encounter (Signed)
Patient did not show for his [facility] appointment on 04/26/17. Will attempt to reschedule.

## 2017-08-03 ENCOUNTER — Emergency Department: Payer: 59

## 2017-08-03 ENCOUNTER — Inpatient Hospital Stay
Admission: EM | Admit: 2017-08-03 | Discharge: 2017-08-12 | DRG: 175 | Disposition: A | Discharge status: Home / Self Care | Payer: 59 | Attending: Internal Medicine | Admitting: Internal Medicine

## 2017-08-03 ENCOUNTER — Encounter: Payer: Self-pay | Admitting: *Deleted

## 2017-08-03 DIAGNOSIS — Z6841 Body Mass Index (BMI) 40.0 and over, adult: Secondary | ICD-10-CM | POA: Diagnosis not present

## 2017-08-03 DIAGNOSIS — E1122 Type 2 diabetes mellitus with diabetic chronic kidney disease: Secondary | ICD-10-CM | POA: Diagnosis present

## 2017-08-03 DIAGNOSIS — R05 Cough: Secondary | ICD-10-CM | POA: Diagnosis present

## 2017-08-03 DIAGNOSIS — I2609 Other pulmonary embolism with acute cor pulmonale: Secondary | ICD-10-CM | POA: Diagnosis present

## 2017-08-03 DIAGNOSIS — R0982 Postnasal drip: Secondary | ICD-10-CM | POA: Diagnosis present

## 2017-08-03 DIAGNOSIS — I2699 Other pulmonary embolism without acute cor pulmonale: Secondary | ICD-10-CM | POA: Diagnosis not present

## 2017-08-03 DIAGNOSIS — Z881 Allergy status to other antibiotic agents status: Secondary | ICD-10-CM | POA: Diagnosis not present

## 2017-08-03 DIAGNOSIS — Z7901 Long term (current) use of anticoagulants: Secondary | ICD-10-CM

## 2017-08-03 DIAGNOSIS — I493 Ventricular premature depolarization: Secondary | ICD-10-CM | POA: Diagnosis present

## 2017-08-03 DIAGNOSIS — I130 Hypertensive heart and chronic kidney disease with heart failure and stage 1 through stage 4 chronic kidney disease, or unspecified chronic kidney disease: Secondary | ICD-10-CM | POA: Diagnosis present

## 2017-08-03 DIAGNOSIS — D6859 Other primary thrombophilia: Secondary | ICD-10-CM | POA: Diagnosis present

## 2017-08-03 DIAGNOSIS — E1151 Type 2 diabetes mellitus with diabetic peripheral angiopathy without gangrene: Secondary | ICD-10-CM | POA: Diagnosis present

## 2017-08-03 DIAGNOSIS — Z9119 Patient's noncompliance with other medical treatment and regimen: Secondary | ICD-10-CM

## 2017-08-03 DIAGNOSIS — I5033 Acute on chronic diastolic (congestive) heart failure: Secondary | ICD-10-CM | POA: Diagnosis present

## 2017-08-03 DIAGNOSIS — I10 Essential (primary) hypertension: Secondary | ICD-10-CM | POA: Diagnosis present

## 2017-08-03 DIAGNOSIS — T45516A Underdosing of anticoagulants, initial encounter: Secondary | ICD-10-CM | POA: Diagnosis present

## 2017-08-03 DIAGNOSIS — I5032 Chronic diastolic (congestive) heart failure: Secondary | ICD-10-CM | POA: Diagnosis present

## 2017-08-03 DIAGNOSIS — Z86718 Personal history of other venous thrombosis and embolism: Secondary | ICD-10-CM | POA: Diagnosis not present

## 2017-08-03 DIAGNOSIS — Z882 Allergy status to sulfonamides status: Secondary | ICD-10-CM

## 2017-08-03 DIAGNOSIS — Z539 Procedure and treatment not carried out, unspecified reason: Secondary | ICD-10-CM | POA: Diagnosis not present

## 2017-08-03 DIAGNOSIS — I739 Peripheral vascular disease, unspecified: Secondary | ICD-10-CM | POA: Diagnosis present

## 2017-08-03 DIAGNOSIS — R059 Cough, unspecified: Secondary | ICD-10-CM

## 2017-08-03 DIAGNOSIS — J9621 Acute and chronic respiratory failure with hypoxia: Secondary | ICD-10-CM | POA: Diagnosis present

## 2017-08-03 DIAGNOSIS — N183 Chronic kidney disease, stage 3 unspecified: Secondary | ICD-10-CM | POA: Diagnosis present

## 2017-08-03 DIAGNOSIS — I5031 Acute diastolic (congestive) heart failure: Secondary | ICD-10-CM | POA: Diagnosis present

## 2017-08-03 DIAGNOSIS — Z95828 Presence of other vascular implants and grafts: Secondary | ICD-10-CM | POA: Diagnosis not present

## 2017-08-03 DIAGNOSIS — Z79899 Other long term (current) drug therapy: Secondary | ICD-10-CM

## 2017-08-03 DIAGNOSIS — I82409 Acute embolism and thrombosis of unspecified deep veins of unspecified lower extremity: Secondary | ICD-10-CM

## 2017-08-03 DIAGNOSIS — J189 Pneumonia, unspecified organism: Secondary | ICD-10-CM | POA: Diagnosis present

## 2017-08-03 DIAGNOSIS — E119 Type 2 diabetes mellitus without complications: Secondary | ICD-10-CM | POA: Insufficient documentation

## 2017-08-03 DIAGNOSIS — I2782 Chronic pulmonary embolism: Secondary | ICD-10-CM | POA: Diagnosis present

## 2017-08-03 DIAGNOSIS — R0602 Shortness of breath: Secondary | ICD-10-CM | POA: Diagnosis not present

## 2017-08-03 LAB — BASIC METABOLIC PANEL
Anion gap: 10 (ref 5–15)
BUN: 17 mg/dL (ref 6–20)
CHLORIDE: 100 mmol/L — AB (ref 101–111)
CO2: 25 mmol/L (ref 22–32)
Calcium: 9.2 mg/dL (ref 8.9–10.3)
Creatinine, Ser: 1.48 mg/dL — ABNORMAL HIGH (ref 0.61–1.24)
GFR calc Af Amer: >60 mL/min (ref >60)
GFR calc non Af Amer: 55 mL/min — ABNORMAL LOW (ref >60)
Glucose, Bld: 102 mg/dL — ABNORMAL HIGH (ref 65–99)
Potassium: 4.5 mmol/L (ref 3.5–5.1)
Sodium: 135 mmol/L (ref 135–145)

## 2017-08-03 LAB — PROTIME-INR
INR: 1.23
Prothrombin Time: 15.4 seconds — ABNORMAL HIGH (ref 11.4–15.2)

## 2017-08-03 LAB — CBC WITH DIFFERENTIAL/PLATELET
Basophils Absolute: 0.1 10*3/uL (ref 0–0.1)
Basophils Relative: 1 %
EOS PCT: 2 %
Eosinophils Absolute: 0.1 10*3/uL (ref 0–0.7)
HCT: 47.5 % (ref 40.0–52.0)
Hemoglobin: 15.5 g/dL (ref 13.0–18.0)
LYMPHS ABS: 1.6 10*3/uL (ref 1.0–3.6)
LYMPHS PCT: 25 %
MCH: 29.8 pg (ref 26.0–34.0)
MCHC: 32.7 g/dL (ref 32.0–36.0)
MCV: 91.1 fL (ref 80.0–100.0)
MONO ABS: 0.6 10*3/uL (ref 0.2–1.0)
Monocytes Relative: 9 %
Neutro Abs: 4.2 10*3/uL (ref 1.4–6.5)
Neutrophils Relative %: 63 %
PLATELETS: 300 10*3/uL (ref 150–440)
RBC: 5.21 MIL/uL (ref 4.40–5.90)
RDW: 14.4 % (ref 11.5–14.5)
WBC: 6.6 10*3/uL (ref 3.8–10.6)

## 2017-08-03 LAB — TROPONIN I

## 2017-08-03 LAB — APTT: aPTT: 27 seconds (ref 24–36)

## 2017-08-03 LAB — BRAIN NATRIURETIC PEPTIDE: B Natriuretic Peptide: 14 pg/mL (ref 0.0–100.0)

## 2017-08-03 MED ORDER — IOPAMIDOL (ISOVUE-370) INJECTION 76%
100.0000 mL | Freq: Once | INTRAVENOUS | Status: AC | PRN
  Administered 2017-08-03: 100 mL via INTRAVENOUS

## 2017-08-03 MED ORDER — OXYCODONE HCL 5 MG PO TABS
5.0000 mg | ORAL_TABLET | ORAL | Status: DC | PRN
  Administered 2017-08-05: 5 mg via ORAL
  Filled 2017-08-03: qty 1

## 2017-08-03 MED ORDER — HEPARIN BOLUS VIA INFUSION
6000.0000 [IU] | Freq: Once | INTRAVENOUS | Status: AC
  Administered 2017-08-03: 6000 [IU] via INTRAVENOUS
  Filled 2017-08-03: qty 6000

## 2017-08-03 MED ORDER — ACETAMINOPHEN 650 MG RE SUPP: 650.0000 mg | Freq: Four times a day (QID) | RECTAL | Status: DC | PRN

## 2017-08-03 MED ORDER — HEPARIN (PORCINE) IN NACL 100-0.45 UNIT/ML-% IJ SOLN
1700.0000 [IU]/h | INTRAMUSCULAR | Status: DC
  Administered 2017-08-03: 1700 [IU]/h via INTRAVENOUS
  Filled 2017-08-03 (×2): qty 250

## 2017-08-03 MED ORDER — ACETAMINOPHEN 325 MG PO TABS: 650.0000 mg | ORAL_TABLET | Freq: Four times a day (QID) | ORAL | Status: DC | PRN

## 2017-08-03 MED ORDER — CEFTRIAXONE SODIUM IN DEXTROSE 20 MG/ML IV SOLN: 1.0000 g | Freq: Once | INTRAVENOUS | Status: DC

## 2017-08-03 MED ORDER — ONDANSETRON HCL 4 MG/2ML IJ SOLN: 4.0000 mg | Freq: Four times a day (QID) | INTRAMUSCULAR | Status: DC | PRN

## 2017-08-03 MED ORDER — MORPHINE SULFATE (PF) 4 MG/ML IV SOLN
4.0000 mg | Freq: Once | INTRAVENOUS | Status: AC
  Administered 2017-08-03: 4 mg via INTRAVENOUS
  Filled 2017-08-03: qty 1

## 2017-08-03 MED ORDER — LISINOPRIL 10 MG PO TABS
10.0000 mg | ORAL_TABLET | Freq: Every day | ORAL | Status: DC
  Administered 2017-08-04 – 2017-08-12 (×8): 10 mg via ORAL
  Filled 2017-08-03 (×9): qty 1

## 2017-08-03 MED ORDER — MORPHINE SULFATE (PF) 4 MG/ML IV SOLN
4.0000 mg | INTRAVENOUS | Status: DC | PRN
  Administered 2017-08-03 – 2017-08-05 (×4): 4 mg via INTRAVENOUS
  Filled 2017-08-03 (×4): qty 1

## 2017-08-03 MED ORDER — AZITHROMYCIN 500 MG PO TABS: 500.0000 mg | ORAL_TABLET | Freq: Once | ORAL | Status: DC

## 2017-08-03 MED ORDER — SODIUM CHLORIDE 0.9 % IV BOLUS (SEPSIS)
1000.0000 mL | Freq: Once | INTRAVENOUS | Status: AC
  Administered 2017-08-03: 1000 mL via INTRAVENOUS

## 2017-08-03 MED ORDER — ONDANSETRON HCL 4 MG/2ML IJ SOLN
4.0000 mg | Freq: Once | INTRAMUSCULAR | Status: AC
  Administered 2017-08-03: 4 mg via INTRAVENOUS
  Filled 2017-08-03: qty 2

## 2017-08-03 MED ORDER — INFLUENZA VAC SPLIT QUAD 0.5 ML IM SUSY
0.5000 mL | PREFILLED_SYRINGE | INTRAMUSCULAR | Status: DC
  Filled 2017-08-03: qty 0.5

## 2017-08-03 MED ORDER — AZITHROMYCIN 500 MG IV SOLR
500.0000 mg | INTRAVENOUS | Status: DC
  Administered 2017-08-03: 500 mg via INTRAVENOUS
  Filled 2017-08-03 (×2): qty 500

## 2017-08-03 MED ORDER — ONDANSETRON HCL 4 MG PO TABS: 4.0000 mg | ORAL_TABLET | Freq: Four times a day (QID) | ORAL | Status: DC | PRN

## 2017-08-03 NOTE — ED Provider Notes (Signed)
[facility] Emergency Department Provider Note  ____________________________________________  Time seen: Approximately 6:03 PM  I have reviewed the triage vital signs and the nursing notes.   HISTORY  Chief Complaint Chest Pain and Shortness of Breath   HPI Ryan [name] is a 46 y.o. 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 patient reports sudden onset of right-sided chest pain that he describes as sharp, constant, pleuritic, nonradiating, associated with shortness of breath. Pain started at 2 PM and is gotten progressively worse. He is complaining of severe pain at this time. He denies paresthesias of his extremities. He endorses a dry cough but no congestion, fever or chills. He endorses compliance with his Coumadin. No hemoptysis, no leg pain or swelling.  Past Medical History:  Diagnosis Date  . CHF (congestive heart failure) (HCC)   . Diabetes mellitus without complication (HCC)    per pt-pre diabetic-Dr Dario Guardian stated said pt  . DVT (deep venous thrombosis) (HCC)   . Hypertension   . Peripheral vascular disease (HCC)   . Presence of IVC filter     Patient Active Problem List   Diagnosis Date Noted  . Pulmonary embolus (HCC) 01/03/2017  . Chronic diastolic heart failure (HCC) 12/15/2016  . HTN (hypertension) 12/15/2016  . Fatigue 12/15/2016  . PVC (premature ventricular contraction) 11/24/2016  . 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  . Super obesity 11/24/2016  . Pulmonary emboli (HCC) 11/22/2016  . DVT (deep venous thrombosis) (HCC) 12/28/2014    Past Surgical History:  Procedure Laterality Date  . PERIPHERAL VASCULAR CATHETERIZATION N/A 01/10/2015   Procedure: Dialysis/Perma Catheter Insertion;  Surgeon: Renford Dills, MD;  Location: [facility] INVASIVE CV LAB;  Service: Cardiovascular;  Laterality: N/A;  . PERIPHERAL VASCULAR CATHETERIZATION N/A 02/24/2015   Procedure: Dialysis/Perma  Catheter Removal;  Surgeon: Annice Needy, MD;  Location: [facility] INVASIVE CV LAB;  Service: Cardiovascular;  Laterality: N/A;    Prior to Admission medications   Medication Sig Start Date End Date Taking? Authorizing Provider  hydrochlorothiazide (HYDRODIURIL) 25 MG tablet Take 1 tablet (25 mg total) by mouth daily. 11/24/16  Yes Gouru, Deanna Artis, MD  lisinopril (PRINIVIL,ZESTRIL) 10 MG tablet Take 1 tablet (10 mg total) by mouth daily. 11/24/16  Yes Gouru, Deanna Artis, MD  Vitamin D, Ergocalciferol, (DRISDOL) 50000 units CAPS capsule Take 50,000 Units by mouth every 7 (seven) days.   Yes [provider]  warfarin (COUMADIN) 3 MG tablet Take 2 tablets (6 mg total) by mouth one time only at 6 PM. Patient taking differently: Take 9 mg by mouth daily.  01/05/17  Yes Sudini, Wardell Heath, MD  enoxaparin (LOVENOX) 150 MG/ML injection Inject 1 mL (150 mg total) into the skin every 12 (twelve) hours. Patient not taking: Reported on 02/25/2017 01/05/17   Milagros Loll, MD    Allergies Clindamycin/lincomycin and Sulfa antibiotics  Family History  Problem Relation Age of Onset  . Hypertension Mother   . Cancer Mother   . Breast cancer Mother   . Prostate cancer Father   . Hypertension Father   . Diabetes Father     Social History Social History   Tobacco Use  . Smoking status: Never Smoker  . Smokeless tobacco: Never Used  Substance Use Topics  . Alcohol use: No  . Drug use: No    Review of Systems  Constitutional: Negative for fever. Eyes: Negative for visual changes. ENT: Negative for sore throat. Neck: No neck pain  Cardiovascular: +  chest pain. Respiratory: + shortness of breath. Gastrointestinal: Negative for abdominal pain, vomiting or diarrhea. Genitourinary: Negative for dysuria. Musculoskeletal: Negative for back pain. Skin: Negative for rash. Neurological: Negative for headaches, weakness or numbness. Psych: No SI or HI  ____________________________________________   PHYSICAL  EXAM:  VITAL SIGNS: ED Triage Vitals  Enc Vitals Group     BP 08/03/17 1732 (!) 132/99     Pulse Rate 08/03/17 1732 99     Resp 08/03/17 1732 (!) 30     Temp 08/03/17 1732 98.4 F (36.9 C)     Temp Source 08/03/17 1732 Oral     SpO2 08/03/17 1732 93 %     Weight --      Height --      Head Circumference --      Peak Flow --      Pain Score 08/03/17 1731 8     Pain Loc --      Pain Edu? --      Excl. in GC? --     Constitutional: Morbidly obese in moderate respiratory distress HEENT:      Head: Normocephalic and atraumatic.         Eyes: Conjunctivae are normal. Sclera is non-icteric.       Mouth/Throat: Mucous membranes are moist.       Neck: Supple with no signs of meningismus. Cardiovascular: Regular rate and rhythm. No murmurs, gallops, or rubs. 2+ symmetrical distal pulses are present in all extremities. No JVD. Respiratory: Tachypneic, normal sats. Lungs are clear to auscultation bilaterally. No wheezes, crackles, or rhonchi.  Gastrointestinal: Soft, non tender, and non distended with positive bowel sounds. No rebound or guarding. Musculoskeletal: Nontender with normal range of motion in all extremities. No edema, cyanosis, or erythema of extremities. Neurologic: Normal speech and language. Face is symmetric. Moving all extremities. No gross focal neurologic deficits are appreciated. Skin: Skin is warm, dry and intact. No rash noted. Psychiatric: Mood and affect are normal. Speech and behavior are normal.  ____________________________________________   LABS (all labs ordered are listed, but only abnormal results are displayed)  Labs Reviewed  PROTIME-INR - Abnormal; Notable for the following components:      Result Value   Prothrombin Time 15.4 (*)    All other components within normal limits  BASIC METABOLIC PANEL - Abnormal; Notable for the following components:   Chloride 100 (*)    Glucose, Bld 102 (*)    Creatinine, Ser 1.48 (*)    GFR calc non Af Amer 55  (*)    All other components within normal limits  CBC WITH DIFFERENTIAL/PLATELET  BRAIN NATRIURETIC PEPTIDE  TROPONIN I  APTT  HEPARIN LEVEL (UNFRACTIONATED)   ____________________________________________  EKG  ED ECG REPORT I, Nita Sickle, the attending physician, personally viewed and interpreted this ECG.  Normal sinus rhythm, rate of 93, normal intervals, normal axis, no ST elevations or depressions. Normal EKG  ____________________________________________  RADIOLOGY  CXR: Low lung volumes without active pulmonary disease  CTA chest:  1. Extensive pulmonary embolus bilaterally, more pronounced on the right than on the left, essentially stable compared to study from 6 months prior. Question persistence of pulmonary embolus compared to prior study versus recurrent pulmonary embolus. Both entities may exist concurrently. 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 There is evidence of right heart strain with a right ventricle to left ventricular diameter ratio of 1.3. Note that this finding was  present on previous study and is essentially stable. Positive for acute PE with CT evidence of right heart strain (RV/LV Ratio = 1.3) consistent with at least submassive (intermediate risk) PE. The presence of right heart strain has been associated with an increased risk of morbidity and mortality. Please activate Code PE by paging [phone].  2. Prominence of the main pulmonary outflow tract, felt to be indicative of pulmonary arterial hypertension.  3. Airspace opacity in the medial segment of the right middle lobe. The appearance is suggestive of pneumonia. A degree of pulmonary infarct in this area is possible. Pneumonia in an area of infarct is also possible. No other airspace consolidations seen. There is patchy lower lobe atelectasis bilaterally.  4. No evident  adenopathy.  5. Inferior vena cava filter present, incompletely visualized.  6. There is hepatic steatosis. ____________________________________________   PROCEDURES  Procedure(s) performed: None Procedures Critical Care performed: yes  CRITICAL CARE Performed by: Nita Sicklearolina Darlette Dubow  ?  Total critical care time: 45 min  Critical care time was exclusive of separately billable procedures and treating other patients.  Critical care was necessary to treat or prevent imminent or life-threatening deterioration.  Critical care was time spent personally by me on the following activities: development of treatment plan with patient and/or surrogate as well as nursing, discussions with consultants, evaluation of patient's response to treatment, examination of patient, obtaining history from patient or surrogate, ordering and performing treatments and interventions, ordering and review of laboratory studies, ordering and review of radiographic studies, pulse oximetry and re-evaluation of patient's condition.  ____________________________________________   INITIAL IMPRESSION / ASSESSMENT AND PLAN / ED COURSE   46 y.o. 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 Patient is on Coumadin. We'll check his INR. We'll send patient for CTA. We'll give morphine and Zofran and IV fluids. EKG with no ischemic changes.    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 Kidney function is normalized. Patient has consented to receive the dye since the clinical suspicion is very high for PE especially with subtherapeutic INR. Patient understands risks  associated with receiving dye. Benefit of the study would be IR tPA if patient found to have a large segmental or saddle emboli. I spoke with Dr. Ova FreshwaterLiebkemann, radiologist who has approved the study. Discussed with radiology tech for emergent STAT CTA. Will order heparin.  _________________________ 7:37 PM on 08/03/2017 -----------------------------------------  CTA concerning for large R main PE with R heart strain and concerns for R pulmonary infarct. Radiology raising suspicion for PNa however clinically I do believe lung changes are due to infarct and not PNA, so no abx have been ordered. Code PE has been activated. Patient currently on heparin.  _________________________ 7:54 PM on 08/03/2017 -----------------------------------------  Spoke with Dr. Jamison NeighborNestor, Intensivist at [facility] who did not believe patient is a candidate for emergent thrombolyze and recommended admission on heparin gtt. Patient will be admitted to the Hospitalist service.  As part of my medical decision making, I reviewed the following data within the electronic MEDICAL RECORD NUMBER Nursing notes reviewed and incorporated, EKG interpreted , Old EKG reviewed, Old chart reviewed, Radiograph reviewed , Discussed with admitting physician , Notes from prior ED visits and  [place] Controlled Substance Database    Pertinent labs & imaging results that were available during my care of the patient were reviewed by me and considered in my medical decision making (see chart for details).    ____________________________________________   FINAL CLINICAL IMPRESSION(S) / ED DIAGNOSES  Final diagnoses:  Other acute pulmonary embolism with acute cor pulmonale (HCC)      NEW MEDICATIONS STARTED DURING THIS VISIT:  ED Discharge Orders    None       Note:  This document was prepared using Dragon voice recognition software and may include unintentional dictation errors.    Nita SickleVeronese, , MD 08/03/17 2005

## 2017-08-03 NOTE — ED Notes (Signed)
Sonja RN, aware of bed assigned  

## 2017-08-03 NOTE — ED Notes (Signed)
Patient given ice chips per Dr. Don PerkingVeronese.

## 2017-08-03 NOTE — H&P (Signed)
[facility] [place] [facility]   PATIENT NAME: Ryan [name]    MR#:  [id number]  DATE OF BIRTH:  [birth year]  DATE OF ADMISSION:  08/03/2017  PRIMARY CARE PHYSICIAN: Sherrie MustacheJadali, Fayegh, MD   REQUESTING/REFERRING PHYSICIAN: Don PerkingVeronese, MD  CHIEF COMPLAINT:   Chief Complaint  Patient presents with  . Chest Pain  . Shortness of Breath    HISTORY OF PRESENT ILLNESS:  Ryan [name]  is a 46 y.o. male who presents with episode of acute onset sharp chest pain today followed by shortness of breath.  Patient was diagnosed with pulmonary emboli several months ago, was started on warfarin at that time.  He states he has been following with his outpatient physician for his INRs and that his levels have been "up and down."  Today his INR is low at 1.2.  Tonight imaging shows PE burden similar to what it was several months ago, with imaging suggesting some amount of chronic clot as well as some amount of new clot, and also questioning some possible pneumonia versus pulmonary infarct.  Hospitalist were called for admission  PAST MEDICAL HISTORY:   Past Medical History:  Diagnosis Date  . CHF (congestive heart failure) (HCC)   . Diabetes mellitus without complication (HCC)    per pt-pre diabetic-Dr Dario GuardianJadali stated said pt  . DVT (deep venous thrombosis) (HCC)   . Hypertension   . Peripheral vascular disease (HCC)   . Presence of IVC filter     PAST SURGICAL HISTORY:   Past Surgical History:  Procedure Laterality Date  . PERIPHERAL VASCULAR CATHETERIZATION N/A 01/10/2015   Procedure: Dialysis/Perma Catheter Insertion;  Surgeon: Renford DillsGregory G Schnier, MD;  Location: [facility] INVASIVE CV LAB;  Service: Cardiovascular;  Laterality: N/A;  . PERIPHERAL VASCULAR CATHETERIZATION N/A 02/24/2015   Procedure: Dialysis/Perma Catheter Removal;  Surgeon: Annice NeedyJason S Dew, MD;  Location: [facility] INVASIVE CV LAB;  Service: Cardiovascular;  Laterality: N/A;    SOCIAL HISTORY:   Social History    Tobacco Use  . Smoking status: Never Smoker  . Smokeless tobacco: Never Used  Substance Use Topics  . Alcohol use: No    FAMILY HISTORY:   Family History  Problem Relation Age of Onset  . Hypertension Mother   . Cancer Mother   . Breast cancer Mother   . Prostate cancer Father   . Hypertension Father   . Diabetes Father     DRUG ALLERGIES:   Allergies  Allergen Reactions  . Clindamycin/Lincomycin Rash  . Sulfa Antibiotics Rash    MEDICATIONS AT HOME:   Prior to Admission medications   Medication Sig Start Date End Date Taking? Authorizing Provider  hydrochlorothiazide (HYDRODIURIL) 25 MG tablet Take 1 tablet (25 mg total) by mouth daily. 11/24/16  Yes Gouru, Deanna ArtisAruna, MD  lisinopril (PRINIVIL,ZESTRIL) 10 MG tablet Take 1 tablet (10 mg total) by mouth daily. 11/24/16  Yes Gouru, Deanna ArtisAruna, MD  Vitamin D, Ergocalciferol, (DRISDOL) 50000 units CAPS capsule Take 50,000 Units by mouth every 7 (seven) days.   Yes [provider]  warfarin (COUMADIN) 3 MG tablet Take 2 tablets (6 mg total) by mouth one time only at 6 PM. Patient taking differently: Take 9 mg by mouth daily.  01/05/17  Yes Sudini, Wardell HeathSrikar, MD  enoxaparin (LOVENOX) 150 MG/ML injection Inject 1 mL (150 mg total) into the skin every 12 (twelve) hours. Patient not taking: Reported on 02/25/2017 01/05/17   Milagros LollSudini, Srikar, MD    REVIEW OF SYSTEMS:  Review of Systems  Constitutional: Negative for chills, fever, malaise/fatigue and weight loss.  HENT: Negative for ear pain, hearing loss and tinnitus.   Eyes: Negative for blurred vision, double vision, pain and redness.  Respiratory: Positive for shortness of breath. Negative for cough and hemoptysis.   Cardiovascular: Positive for chest pain. Negative for palpitations, orthopnea and leg swelling.  Gastrointestinal: Negative for abdominal pain, constipation, diarrhea, nausea and vomiting.  Genitourinary: Negative for dysuria, frequency and hematuria.  Musculoskeletal:  Negative for back pain, joint pain and neck pain.  Skin:       No acne, rash, or lesions  Neurological: Negative for dizziness, tremors, focal weakness and weakness.  Endo/Heme/Allergies: Negative for polydipsia. Does not bruise/bleed easily.  Psychiatric/Behavioral: Negative for depression. The patient is not nervous/anxious and does not have insomnia.      VITAL SIGNS:   Vitals:   08/03/17 1900 08/03/17 1930 08/03/17 2007 08/03/17 2051  BP: (!) 159/84 (!) 164/93 (!) 154/85 (!) 148/92  Pulse:  84 91 89  Resp: (!) 30 (!) 26 (!) 27 20  Temp:      TempSrc:      SpO2:  96% 100% 99%  Weight:      Height:       Wt Readings from Last 3 Encounters:  08/03/17 (!) 165.6 kg (365 lb)  02/25/17 (!) 164.9 kg (363 lb 8 oz)  01/18/17 (!) 161.7 kg (356 lb 8 oz)    PHYSICAL EXAMINATION:  Physical Exam  Vitals reviewed. Constitutional: He is oriented to person, place, and time. He appears well-developed and well-nourished. No distress.  HENT:  Head: Normocephalic and atraumatic.  Mouth/Throat: Oropharynx is clear and moist.  Eyes: Conjunctivae and EOM are normal. Pupils are equal, round, and reactive to light. No scleral icterus.  Neck: Normal range of motion. Neck supple. No JVD present. No thyromegaly present.  Cardiovascular: Normal rate, regular rhythm and intact distal pulses. Exam reveals no gallop and no friction rub.  No murmur heard. Respiratory: Effort normal and breath sounds normal. No respiratory distress. He has no wheezes. He has no rales.  GI: Soft. Bowel sounds are normal. He exhibits no distension. There is no tenderness.  Musculoskeletal: Normal range of motion. He exhibits no edema.  No arthritis, no gout  Lymphadenopathy:    He has no cervical adenopathy.  Neurological: He is alert and oriented to person, place, and time. No cranial nerve deficit.  No dysarthria, no aphasia  Skin: Skin is warm and dry. No rash noted. No erythema.  Psychiatric: He has a normal mood  and affect. His behavior is normal. Judgment and thought content normal.    LABORATORY PANEL:   CBC Recent Labs  Lab 08/03/17 1736  WBC 6.6  HGB 15.5  HCT 47.5  PLT 300   ------------------------------------------------------------------------------------------------------------------  Chemistries  Recent Labs  Lab 08/03/17 1736  NA 135  K 4.5  CL 100*  CO2 25  GLUCOSE 102*  BUN 17  CREATININE 1.48*  CALCIUM 9.2   ------------------------------------------------------------------------------------------------------------------  Cardiac Enzymes Recent Labs  Lab 08/03/17 1736  TROPONINI <0.03   ------------------------------------------------------------------------------------------------------------------  RADIOLOGY:  Ct Angio Chest Pe W And/or Wo Contrast  Result Date: 08/03/2017 CLINICAL DATA:  Chest pain. Shortness of breath. History of pulmonary embolus EXAM: CT ANGIOGRAPHY CHEST WITH CONTRAST TECHNIQUE: Multidetector CT imaging of the chest was performed using the standard protocol during bolus administration of intravenous contrast. Multiplanar CT image reconstructions and MIPs were obtained to evaluate the vascular anatomy. CONTRAST:  ISOVUE-370 IOPAMIDOL (ISOVUE-370) INJECTION  76% COMPARISON:  CT angiogram chest Jan 03, 2017; chest radiograph August 03, 2017 FINDINGS: Cardiovascular: There is again noted extensive pulmonary embolus on the right extending from the anterior aspect of the right main pulmonary outflow tract with extension into multiple right upper and lower lobe pulmonary arterial branches, essentially stable compared to 6 months prior. On the right, there are pulmonary emboli in both proximal upper and lower lobe pulmonary artery branches as well as in several more peripheral right lower extremity arterial branches. The appearance is essentially stable compared to the prior study. The right ventricle to left ventricle diameter ratio is 1.3,  consistent with right heart strain. There is no appreciable thoracic aortic aneurysm or dissection. The visualized great vessels appear unremarkable. The main pulmonary outflow tract measures 3.4 cm, abnormally large and felt to be indicative of pulmonary arterial hypertension. Pericardium is not appreciably thickened. Mediastinum/Nodes: Visualized thyroid appears normal. No adenopathy by size criteria is evident. A few subcentimeter mediastinal lymph nodes are present. No esophageal lesions are evident. Lungs/Pleura: There is airspace consolidation in a portion of the medial segment of the right middle lobe. There is atelectatic change in both lower lobes. The lungs elsewhere are clear. No pleural effusions are evident. Upper Abdomen: In the visualized upper abdomen, there is incomplete visualization of a filter within the inferior vena cava. There is hepatic steatosis. Visualized upper abdominal structures otherwise appear unremarkable. Musculoskeletal: There is degenerative change in the lower thoracic spine. No blastic or lytic bone lesions. Review of the MIP images confirms the above findings. IMPRESSION: 1. Extensive pulmonary embolus bilaterally, more pronounced on the right than on the left, essentially stable compared to study from 6 months prior. Question persistence of pulmonary embolus compared to prior study versus recurrent pulmonary embolus. Both entities may exist concurrently. 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 There is evidence of right heart strain with a right ventricle to left ventricular diameter ratio of 1.3. Note that this finding was present on previous study and is essentially stable. Positive for acute PE with CT evidence of right heart strain (RV/LV Ratio = 1.3) consistent with at least submassive (intermediate risk) PE. The presence of right heart strain has been associated with an  increased risk of morbidity and mortality. Please activate Code PE by paging [phone]. 2. Prominence of the main pulmonary outflow tract, felt to be indicative of pulmonary arterial hypertension. 3. Airspace opacity in the medial segment of the right middle lobe. The appearance is suggestive of pneumonia. A degree of pulmonary infarct in this area is possible. Pneumonia in an area of infarct is also possible. No other airspace consolidations seen. There is patchy lower lobe atelectasis bilaterally. 4.  No evident adenopathy. 5.  Inferior vena cava filter present, incompletely visualized. 6.  There is hepatic steatosis. Critical Value/emergent results were called by telephone at the time of interpretation on 08/03/2017 at 7:25 pm to Dr. Nita Sickle , who verbally acknowledged these results. Electronically Signed   By: Bretta Bang III M.D.   On: 08/03/2017 19:27   Dg Chest Portable 1 View  Result Date: 08/03/2017 CLINICAL DATA:  Chest pain since 1400 hours today. EXAM: PORTABLE CHEST 1 VIEW COMPARISON:  01/03/2017 FINDINGS: The heart size and mediastinal contours are within normal limits. Low lung volumes are noted. No pneumonic consolidation or CHF. No effusion or pneumothorax. Stable mild right hilar prominence believed related to vascular prominence. The visualized skeletal structures  are unremarkable. IMPRESSION: Low lung volumes without active pulmonary disease Electronically Signed   By: Tollie Ethavid  Kwon M.D.   On: 08/03/2017 18:17    EKG:   Orders placed or performed during the hospital encounter of 08/03/17  . EKG 12-Lead  . EKG 12-Lead    IMPRESSION AND PLAN:  Principal Problem:   Pulmonary emboli (HCC) -IV heparin started, will get a pulmonology consult to help evaluate chronic versus acute clot and recommendations for further treatment Active Problems:   CAP (community acquired pneumonia) -IV antibiotics   Chronic diastolic heart failure (HCC) -echocardiogram, cardiology consult  given the persistent right heart strain   HTN (hypertension) -home meds   CKD (chronic kidney disease), stage III (HCC) -stable, baseline, avoid nephrotoxins and monitor  All the records are reviewed and case discussed with ED provider. Management plans discussed with the patient and/or family.  DVT PROPHYLAXIS: Systemic anticoagulation  GI PROPHYLAXIS: None  ADMISSION STATUS: Inpatient  CODE STATUS: Full Code Status History    Date Active Date Inactive Code Status Order ID Comments User Context   01/03/2017 18:39 01/04/2017 21:56 Full Code [id number]  Houston SirenSainani, Vivek J, MD Inpatient   11/22/2016 16:51 11/24/2016 21:25 Full Code [id number]  [name], Qing, MD Inpatient   12/28/2014 23:45 01/15/2015 22:11 Full Code [id number]  Gasper SellsVickers, Pamela, RN Inpatient      TOTAL TIME TAKING CARE OF THIS PATIENT: 45 minutes.   Shai Rasmussen FIELDING 08/03/2017, 9:00 PM  [facility]  Office  [phone]  CC: Primary care physician; Sherrie MustacheJadali, Fayegh, MD  Note:  This document was prepared using Dragon voice recognition software and may include unintentional dictation errors.

## 2017-08-03 NOTE — ED Triage Notes (Signed)
Per EMS report, patient c/o chest pain since approximately 1400 today. Patient has a history of PE. Patient also c/o SOB.

## 2017-08-03 NOTE — Progress Notes (Signed)
ANTICOAGULATION CONSULT NOTE - Initial Consult  Pharmacy Consult for heparin gtt Indication: pulmonary embolus  Allergies  Allergen Reactions  . Clindamycin/Lincomycin Rash  . Sulfa Antibiotics Rash    Patient Measurements: Height: 5\' 7"  (170.2 cm) Weight: (!) 365 lb (165.6 kg) IBW/kg (Calculated) : 66.1 Heparin Dosing Weight: 107.5kg   Vital Signs: Temp: 98.4 F (36.9 C) (12/05 1732) Temp Source: Oral (12/05 1732) BP: 159/84 (12/05 1900) Pulse Rate: 85 (12/05 1830)  Labs: Recent Labs    08/03/17 1736  HGB 15.5  HCT 47.5  PLT 300  LABPROT 15.4*  INR 1.23  CREATININE 1.48*  TROPONINI <0.03    Estimated Creatinine Clearance: 92.4 mL/min (A) (by C-G formula based on SCr of 1.48 mg/dL (H)).   Medical History: Past Medical History:  Diagnosis Date  . CHF (congestive heart failure) (HCC)   . Diabetes mellitus without complication (HCC)    per pt-pre diabetic-Dr Dario GuardianJadali stated said pt  . DVT (deep venous thrombosis) (HCC)   . Hypertension   . Peripheral vascular disease (HCC)   . Presence of IVC filter     Medications:   (Not in a hospital admission) Scheduled:  . azithromycin  500 mg Oral Once  . heparin  6,000 Units Intravenous Once   Infusions:  . cefTRIAXone (ROCEPHIN)  IV    . heparin     PRN:  Anti-infectives (From admission, onward)   Start     Dose/Rate Route Frequency Ordered Stop   08/03/17 1930  cefTRIAXone (ROCEPHIN) 1 g in dextrose 5 % 50 mL IVPB - Premix     1 g 100 mL/hr over 30 Minutes Intravenous  Once 08/03/17 1927     08/03/17 1930  azithromycin (ZITHROMAX) tablet 500 mg     500 mg Oral  Once 08/03/17 1927        Assessment: 46 year old male with large PE, presents subtherapeutic on warfarin 9mg  daily (per patient). INR 1.2. Per Dr Don PerkingVeronese start heparin gtt with bolus of 6000 units, this was discussed.   Goal of Therapy:  Heparin level 0.3-0.7 units/ml Monitor platelets by anticoagulation protocol: Yes   Plan:  Give 6000  units bolus x 1 Start heparin infusion at 1700 units/hr Check anti-Xa level in 6 hours and daily while on heparin Continue to monitor H&H and platelets  Gerre PebblesGarrett Lareen Mullings 08/03/2017,7:33 PM

## 2017-08-03 NOTE — ED Notes (Signed)
RN on floor unable to take report at this time.

## 2017-08-03 NOTE — ED Notes (Signed)
Attempted to call report. Floor was busy with recent admission.

## 2017-08-03 NOTE — ED Notes (Signed)
Patient desat to 85% on RA after morphine administration. Patient placed on 4L O2 via [place] and sats rose to 94%. Dr. Don PerkingVeronese aware.

## 2017-08-04 ENCOUNTER — Encounter
Admission: EM | Disposition: A | Discharge status: Home / Self Care | Payer: Self-pay | Source: Home / Self Care | Attending: Internal Medicine

## 2017-08-04 ENCOUNTER — Encounter: Payer: Self-pay | Admitting: *Deleted

## 2017-08-04 ENCOUNTER — Inpatient Hospital Stay: Payer: 59

## 2017-08-04 ENCOUNTER — Other Ambulatory Visit: Payer: Self-pay

## 2017-08-04 DIAGNOSIS — N183 Chronic kidney disease, stage 3 (moderate): Secondary | ICD-10-CM

## 2017-08-04 DIAGNOSIS — R0602 Shortness of breath: Secondary | ICD-10-CM

## 2017-08-04 DIAGNOSIS — I10 Essential (primary) hypertension: Secondary | ICD-10-CM

## 2017-08-04 DIAGNOSIS — I2782 Chronic pulmonary embolism: Secondary | ICD-10-CM

## 2017-08-04 DIAGNOSIS — E119 Type 2 diabetes mellitus without complications: Secondary | ICD-10-CM

## 2017-08-04 DIAGNOSIS — I2699 Other pulmonary embolism without acute cor pulmonale: Secondary | ICD-10-CM

## 2017-08-04 LAB — CBC
HCT: 42.2 % (ref 40.0–52.0)
Hemoglobin: 13.9 g/dL (ref 13.0–18.0)
MCH: 30.1 pg (ref 26.0–34.0)
MCHC: 33 g/dL (ref 32.0–36.0)
MCV: 91.2 fL (ref 80.0–100.0)
PLATELETS: 263 10*3/uL (ref 150–440)
RBC: 4.63 MIL/uL (ref 4.40–5.90)
RDW: 14.8 % — AB (ref 11.5–14.5)
WBC: 7.2 10*3/uL (ref 3.8–10.6)

## 2017-08-04 LAB — BASIC METABOLIC PANEL
Anion gap: 6 (ref 5–15)
BUN: 17 mg/dL (ref 6–20)
CALCIUM: 8.5 mg/dL — AB (ref 8.9–10.3)
CO2: 26 mmol/L (ref 22–32)
CREATININE: 1.53 mg/dL — AB (ref 0.61–1.24)
Chloride: 103 mmol/L (ref 101–111)
GFR calc non Af Amer: 53 mL/min — ABNORMAL LOW (ref >60)
Glucose, Bld: 96 mg/dL (ref 65–99)
Potassium: 3.8 mmol/L (ref 3.5–5.1)
SODIUM: 135 mmol/L (ref 135–145)

## 2017-08-04 LAB — HEPARIN LEVEL (UNFRACTIONATED)
Heparin Unfractionated: 0.13 IU/mL — ABNORMAL LOW (ref 0.30–0.70)
Heparin Unfractionated: 0.15 IU/mL — ABNORMAL LOW (ref 0.30–0.70)

## 2017-08-04 LAB — GLUCOSE, CAPILLARY: GLUCOSE-CAPILLARY: 109 mg/dL — AB (ref 65–99)

## 2017-08-04 SURGERY — Surgical Case
Anesthesia: *Unknown

## 2017-08-04 SURGERY — PULMONARY THROMBECTOMY
Anesthesia: Moderate Sedation | Laterality: Bilateral

## 2017-08-04 MED ORDER — DEXTROSE 5 % IV SOLN
2.0000 g | INTRAVENOUS | Status: DC
  Administered 2017-08-04: 2 g via INTRAVENOUS
  Filled 2017-08-04 (×2): qty 2

## 2017-08-04 MED ORDER — HEPARIN BOLUS VIA INFUSION
3000.0000 [IU] | Freq: Once | INTRAVENOUS | Status: AC
  Administered 2017-08-04: 3000 [IU] via INTRAVENOUS
  Filled 2017-08-04: qty 3000

## 2017-08-04 MED ORDER — SODIUM CHLORIDE 0.9 % IV SOLN
INTRAVENOUS | Status: DC
  Administered 2017-08-04 – 2017-08-05 (×2): via INTRAVENOUS

## 2017-08-04 MED ORDER — HEPARIN BOLUS VIA INFUSION
3100.0000 [IU] | Freq: Once | INTRAVENOUS | Status: AC
  Administered 2017-08-04: 3100 [IU] via INTRAVENOUS
  Filled 2017-08-04: qty 3100

## 2017-08-04 MED ORDER — HEPARIN (PORCINE) IN NACL 100-0.45 UNIT/ML-% IJ SOLN
2200.0000 [IU]/h | INTRAMUSCULAR | Status: DC
  Administered 2017-08-04: 2200 [IU]/h via INTRAVENOUS
  Administered 2017-08-04: 2000 [IU]/h via INTRAVENOUS
  Administered 2017-08-05 – 2017-08-11 (×13): 2200 [IU]/h via INTRAVENOUS
  Filled 2017-08-04 (×14): qty 250

## 2017-08-04 MED ORDER — PNEUMOCOCCAL VAC POLYVALENT 25 MCG/0.5ML IJ INJ
0.5000 mL | INJECTION | INTRAMUSCULAR | Status: DC
  Filled 2017-08-04: qty 0.5

## 2017-08-04 NOTE — Consult Note (Signed)
[facility] Vascular Consult Note  MRN : [id number]  Ryan [name] is a 46 y.o. (1971-02-19) male who presents with chief complaint of  Chief Complaint  Patient presents with  . Chest Pain  . Shortness of Breath   History of Present Illness:  Vascular surgery service consulted by Dr. Trudie Reed for possible pulmonary thrombus lysis due to patient's recent diagnosis of pulmonary embolism.   Patient has a past medical history of DVT and PE.  The patient endorses a history of increasing shortness of breath and chest pain over the last 2 days which prompted him to seek medical attention at [facility] ED.  The patient takes Coumadin for chronic anticoagulation however was subtherapeutic with an INR of 1.23.  CT a of the chest conducted on August 03, 2017 was notable for extensive pulmonary embolus bilaterally, more pronounced on the right than on the left, essentially stable compared to study from 6 months prior.  The patient was admitted and started on heparin drip.  The patient continues to complain of shortness of breath at rest and with activity.  The patient is also experiencing continued chest pain even with the heparin drip and pain medication.  His symptoms have not improved with 4 L nasal cannula.  Patient denies any bilateral lower extremity pain.  The patient notes a long-standing history of bilateral lower extremity edema.  The patient denies any fever, nausea vomiting.  Current Facility-Administered Medications  Medication Dose Route Frequency Provider Last Rate Last Dose  . acetaminophen (TYLENOL) tablet 650 mg  650 mg Oral Q6H PRN Oralia Manis, MD       Or  . acetaminophen (TYLENOL) suppository 650 mg  650 mg Rectal Q6H PRN Oralia Manis, MD      . azithromycin (ZITHROMAX) 500 mg in dextrose 5 % 250 mL IVPB  500 mg Intravenous Q24H Oralia Manis, MD   Stopped at 08/04/17 [phone]  . cefTRIAXone (ROCEPHIN) 2 g in dextrose 5 % 50 mL IVPB  2 g  Intravenous Q24H Oralia Manis, MD   Stopped at 08/04/17 0155  . heparin ADULT infusion 100 units/mL (25000 units/262mL sodium chloride 0.45%)  2,000 Units/hr Intravenous Continuous Cindi Carbon, RPH 20 mL/hr at 08/04/17 0739 2,000 Units/hr at 08/04/17 0739  . Influenza vac split quadrivalent PF (FLUARIX) injection 0.5 mL  0.5 mL Intramuscular Tomorrow-1000 Oralia Manis, MD      . lisinopril (PRINIVIL,ZESTRIL) tablet 10 mg  10 mg Oral Daily Oralia Manis, MD   10 mg at 08/04/17 0854  . morphine 4 MG/ML injection 4 mg  4 mg Intravenous Q4H PRN Oralia Manis, MD   4 mg at 08/04/17 0733  . ondansetron (ZOFRAN) tablet 4 mg  4 mg Oral Q6H PRN Oralia Manis, MD       Or  . ondansetron [facility]) injection 4 mg  4 mg Intravenous Q6H PRN Oralia Manis, MD      . oxyCODONE (Oxy IR/ROXICODONE) immediate release tablet 5 mg  5 mg Oral Q4H PRN Oralia Manis, MD      . Melene Muller ON 08/05/2017] pneumococcal 23 valent vaccine (PNU-IMMUNE) injection 0.5 mL  0.5 mL Intramuscular Tomorrow-1000 Oralia Manis, MD       Past Medical History:  Diagnosis Date  . CHF (congestive heart failure) (HCC)   . Diabetes mellitus without complication (HCC)    per pt-pre diabetic-Dr Dario Guardian stated said pt  . DVT (deep venous thrombosis) (HCC)   . Hypertension   . Peripheral vascular  disease (HCC)   . Presence of IVC filter    Past Surgical History:  Procedure Laterality Date  . PERIPHERAL VASCULAR CATHETERIZATION N/A 01/10/2015   Procedure: Dialysis/Perma Catheter Insertion;  Surgeon: Renford DillsGregory G Schnier, MD;  Location: [facility] INVASIVE CV LAB;  Service: Cardiovascular;  Laterality: N/A;  . PERIPHERAL VASCULAR CATHETERIZATION N/A 02/24/2015   Procedure: Dialysis/Perma Catheter Removal;  Surgeon: Annice NeedyJason S Reygan Heagle, MD;  Location: [facility] INVASIVE CV LAB;  Service: Cardiovascular;  Laterality: N/A;   Social History Social History   Tobacco Use  . Smoking status: Never Smoker  . Smokeless tobacco: Never Used  Substance Use Topics  .  Alcohol use: No  . Drug use: No   Family History Family History  Problem Relation Age of Onset  . Hypertension Mother   . Cancer Mother   . Breast cancer Mother   . Prostate cancer Father   . Hypertension Father   . Diabetes Father   The patient denies any family history of peripheral artery disease, venous disease or renal disease.   Allergies  Allergen Reactions  . Clindamycin/Lincomycin Rash  . Sulfa Antibiotics Rash   REVIEW OF SYSTEMS (Negative unless checked)  Constitutional: [] Weight loss  [] Fever  [] Chills Cardiac: [x] Chest pain   [x] Chest pressure   [x] Palpitations   [x] Shortness of breath when laying flat   [x] Shortness of breath at rest   [x] Shortness of breath with exertion. Vascular:  [] Pain in legs with walking   [] Pain in legs at rest   [] Pain in legs when laying flat   [] Claudication   [] Pain in feet when walking  [] Pain in feet at rest  [] Pain in feet when laying flat   [x] History of DVT   [] Phlebitis   [] Swelling in legs   [] Varicose veins   [] Non-healing ulcers Pulmonary:   [] Uses home oxygen   [] Productive cough   [] Hemoptysis   [] Wheeze  [] COPD   [] Asthma Neurologic:  [] Dizziness  [] Blackouts   [] Seizures   [] History of stroke   [] History of TIA  [] Aphasia   [] Temporary blindness   [] Dysphagia   [] Weakness or numbness in arms   [] Weakness or numbness in legs Musculoskeletal:  [] Arthritis   [] Joint swelling   [] Joint pain   [] Low back pain Hematologic:  [] Easy bruising  [] Easy bleeding   [] Hypercoagulable state   [] Anemic  [] Hepatitis Gastrointestinal:  [] Blood in stool   [] Vomiting blood  [] Gastroesophageal reflux/heartburn   [] Difficulty swallowing. Genitourinary:  [] Chronic kidney disease   [] Difficult urination  [] Frequent urination  [] Burning with urination   [] Blood in urine Skin:  [] Rashes   [] Ulcers   [] Wounds Psychological:  [] History of anxiety   []  History of major depression.  Physical Examination  Vitals:   08/03/17 2250 08/04/17 0417 08/04/17 0854  08/04/17 0855  BP: (!) 144/89 124/81 135/65 135/65  Pulse: 94 77  80  Resp:  18    Temp: 99.7 F (37.6 C) 98.6 F (37 C)  98.6 F (37 C)  TempSrc: Oral Oral  Oral  SpO2: 97% 96%  95%  Weight: (!) 346 lb 14.4 oz (157.4 kg)     Height: 5\' 7"  (1.702 m)      Body mass index is 54.33 kg/m. Gen:  WD/WN, NAD Head: [place]/AT, No temporalis wasting. Prominent temp pulse not noted. Ear/Nose/Throat: Hearing grossly intact, nares w/o erythema or drainage, oropharynx w/o Erythema/Exudate Eyes: Sclera non-icteric, conjunctiva clear Neck: Trachea midline.  No JVD.  Pulmonary:  Good air movement, respirations not labored, equal bilaterally.  Cardiac:  RRR, normal S1, S2. Vascular:  Vessel Right Left  Radial Palpable Palpable  Ulnar Palpable Palpable  Brachial Palpable Palpable  Carotid Palpable, without bruit Palpable, without bruit  Aorta Not palpable N/A  Femoral Palpable Palpable  Popliteal Non-palpable Non-palpable  PT Non-palpable Non-palpable  DP Non-palpable Non-palpable   Gastrointestinal: soft, non-tender/non-distended. No guarding/reflex.  Musculoskeletal: M/S 5/5 throughout.  Extremities without ischemic changes.  No deformity or atrophy.  Mild to moderate 1+ pitting edema noted bilaterally Neurologic: Sensation grossly intact in extremities.  Symmetrical.  Speech is fluent. Motor exam as listed above. Psychiatric: Judgment intact, Mood & affect appropriate for pt's clinical situation. Dermatologic: No rashes or ulcers noted.  No cellulitis or open wounds. Lymph : No Cervical, Axillary, or Inguinal lymphadenopathy.  CBC Lab Results  Component Value Date   WBC 7.2 08/04/2017   HGB 13.9 08/04/2017   HCT 42.2 08/04/2017   MCV 91.2 08/04/2017   PLT 263 08/04/2017   BMET    Component Value Date/Time   NA 135 08/04/2017 0216   NA 130 (L) 12/28/2014 1602   K 3.8 08/04/2017 0216   K 3.8 12/28/2014 1602   CL 103 08/04/2017 0216   CL 94 (L) 12/28/2014 1602   CO2 26 08/04/2017  0216   CO2 24 12/28/2014 1602   GLUCOSE 96 08/04/2017 0216   GLUCOSE 121 (H) 12/28/2014 1602   BUN 17 08/04/2017 0216   BUN 57 (H) 12/28/2014 1602   CREATININE 1.53 (H) 08/04/2017 0216   CREATININE 5.55 (H) 12/28/2014 1602   CALCIUM 8.5 (L) 08/04/2017 0216   CALCIUM 8.1 (L) 12/28/2014 1602   GFRNONAA 53 (L) 08/04/2017 0216   GFRNONAA 11 (L) 12/28/2014 1602   GFRAA >60 08/04/2017 0216   GFRAA 13 (L) 12/28/2014 1602   Estimated Creatinine Clearance: 86.6 mL/min (A) (by C-G formula based on SCr of 1.53 mg/dL (H)).  COAG Lab Results  Component Value Date   INR 1.23 08/03/2017   INR 1.36 01/03/2017   INR 1.2 12/23/2014   Radiology Ct Angio Chest Pe W And/or Wo Contrast  Result Date: 08/03/2017 CLINICAL DATA:  Chest pain. Shortness of breath. History of pulmonary embolus EXAM: CT ANGIOGRAPHY CHEST WITH CONTRAST TECHNIQUE: Multidetector CT imaging of the chest was performed using the standard protocol during bolus administration of intravenous contrast. Multiplanar CT image reconstructions and MIPs were obtained to evaluate the vascular anatomy. CONTRAST:  ISOVUE-370 IOPAMIDOL (ISOVUE-370) INJECTION 76% COMPARISON:  CT angiogram chest Jan 03, 2017; chest radiograph August 03, 2017 FINDINGS: Cardiovascular: There is again noted extensive pulmonary embolus on the right extending from the anterior aspect of the right main pulmonary outflow tract with extension into multiple right upper and lower lobe pulmonary arterial branches, essentially stable compared to 6 months prior. On the right, there are pulmonary emboli in both proximal upper and lower lobe pulmonary artery branches as well as in several more peripheral right lower extremity arterial branches. The appearance is essentially stable compared to the prior study. The right ventricle to left ventricle diameter ratio is 1.3, consistent with right heart strain. There is no appreciable thoracic aortic aneurysm or dissection. The visualized  great vessels appear unremarkable. The main pulmonary outflow tract measures 3.4 cm, abnormally large and felt to be indicative of pulmonary arterial hypertension. Pericardium is not appreciably thickened. Mediastinum/Nodes: Visualized thyroid appears normal. No adenopathy by size criteria is evident. A few subcentimeter mediastinal lymph nodes are present. No esophageal lesions are evident. Lungs/Pleura: There is airspace consolidation in a portion  of the medial segment of the right middle lobe. There is atelectatic change in both lower lobes. The lungs elsewhere are clear. No pleural effusions are evident. Upper Abdomen: In the visualized upper abdomen, there is incomplete visualization of a filter within the inferior vena cava. There is hepatic steatosis. Visualized upper abdominal structures otherwise appear unremarkable. Musculoskeletal: There is degenerative change in the lower thoracic spine. No blastic or lytic bone lesions. Review of the MIP images confirms the above findings. IMPRESSION: 1. Extensive pulmonary embolus bilaterally, more pronounced on the right than on the left, essentially stable compared to study from 6 months prior. Question persistence of pulmonary embolus compared to prior study versus recurrent pulmonary embolus. Both entities may exist concurrently. 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 There is evidence of right heart strain with a right ventricle to left ventricular diameter ratio of 1.3. Note that this finding was present on previous study and is essentially stable. Positive for acute PE with CT evidence of right heart strain (RV/LV Ratio = 1.3) consistent with at least submassive (intermediate risk) PE. The presence of right heart strain has been associated with an increased risk of morbidity and mortality. Please activate Code PE by paging [phone]. 2. Prominence of the main  pulmonary outflow tract, felt to be indicative of pulmonary arterial hypertension. 3. Airspace opacity in the medial segment of the right middle lobe. The appearance is suggestive of pneumonia. A degree of pulmonary infarct in this area is possible. Pneumonia in an area of infarct is also possible. No other airspace consolidations seen. There is patchy lower lobe atelectasis bilaterally. 4.  No evident adenopathy. 5.  Inferior vena cava filter present, incompletely visualized. 6.  There is hepatic steatosis. Critical Value/emergent results were called by telephone at the time of interpretation on 08/03/2017 at 7:25 pm to Dr. Nita Sickle , who verbally acknowledged these results. Electronically Signed   By: Bretta Bang III M.D.   On: 08/03/2017 19:27   Dg Chest Portable 1 View  Result Date: 08/03/2017 CLINICAL DATA:  Chest pain since 1400 hours today. EXAM: PORTABLE CHEST 1 VIEW COMPARISON:  01/03/2017 FINDINGS: The heart size and mediastinal contours are within normal limits. Low lung volumes are noted. No pneumonic consolidation or CHF. No effusion or pneumothorax. Stable mild right hilar prominence believed related to vascular prominence. The visualized skeletal structures are unremarkable. IMPRESSION: Low lung volumes without active pulmonary disease Electronically Signed   By: Tollie Eth M.D.   On: 08/03/2017 18:17   Assessment/Plan The patient is a 46 year old man with a past medical history of DVT and PE.  The patient presented to the [facility] with progressively worsening shortness of breath and chest pain and found to have a worsening pulmonary embolism on CTA.  Patient INR therapeutic subtherapeutic 1. PE: Worsening bilateral pulmonary embolism on recent CTA.  The patient has remained symptomatic complaining of shortness of breath and chest pain with supplemental oxygen, pain medication as well as a heparin drip started.  Due to the size of the patient's pulmonary  embolism and his persistent symptoms recommend a pulmonary thrombolysis.  Procedure, risks and benefits explained to the patient.  All questions answered.  The patient is willing to proceed.  Gust with Dr. Wyn Quaker 2. DM: Encouraged good control as its slows the progression of atherosclerotic disease 3. HTN: Encouraged good control as its slows the progression of atherosclerotic disease   KIMBERLY  A STEGMAYER, PA-C  08/04/2017 1:01 PM    This note was created with Dragon medical transcription system.  Any error is purely unintentional  ADDENDUM After reviewing his previous images his CT scan this admission is basically the same as it was on a CT scan from May of this year.  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 although there certainly could be some acute component of it, with most of the clot being chronic thrombus, percutaneous therapy would be unlikely to provide significant improvement.  He would be somebody to consider for an open surgical pulmonary embolectomy, but this would be a [facility] procedure done under cardiopulmonary bypass and we do not do those procedures here.  He may not be a great candidate for that due to his noncompliance and likelihood of rethrombosis.  At this point, I would recommend anticoagulation alone and no endovascular therapy to be performed today.  Have discussed the case with the primary service.   Festus BarrenJason Ethelean Colla, MD

## 2017-08-04 NOTE — Consult Note (Signed)
[facility] Pulmonary Medicine Consultation      Assessment and Plan:  Acute on chronic pulmonary embolism with acute hypoxic respiratory failure.. -Though I do not appreciate  evidence of acute pulmonary embolism on CT chest, symptoms of pleuritic chest pain acute onset dyspnea are consistent with acute PE. --This most recent episode likely occurred as the patient was dieting, eating more greens, and not following his INR closely enough.  -Vascular surgery consulted for possible thrombectomy.  Hypercoagulable state - Patient continues to have recurrent thromboemboli, this is at least partially due to noncompliance with INR testing. --This patient clearly needs to be on lifelong anticoagulation, his 2 IVC filters and morbid obesity and current clots make him likely to develop recurrent clots.  --To my knowledge he has not had a recent VTE event when he is taking anticoagulation as prescribed, either coumadin or eliquis. His 2 most recent episodes occurred when he did not take his eliquis or did not monitor his coumadin after he went on a diet.  Thus compliance appears to be a major factor in his recurrent clots. I would therefore recommend that the patient be maintained on whichever regimen he believes he can be most committed to; Eliquis would be recommended as this is the regimen that would require the least amount of monitoring and dose changing and give him the best chance of reducing further episodes. He will need strong reinforcement regarding the life threatening implications of not taking the medication as prescribed.   Date: 08/04/2017  MRN# [id number] Ryan [name] 1971-04-17  Referring Physician: Dr. Cherlynn [name].   Ryan [name] is a 46 y.o. old male seen in consultation for chief complaint of:    Chief Complaint  Patient presents with  . Chest Pain  . 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  Since then he has had he thinks 20 or more VTE events.  He was noted to have a history of problems with controlling his INR in the past, due to not getting his INR checked consistently.  He has been seen in hematology clinic, factor V leiden and antiphospholipid antibody was done and was negative.  He has had a hypercoagulable workup which was reportedly negative, though per history he does have a history of MTHFR mutation.He has a history of 2 IVC filters being placed in the past.  He was seen by Ryan [name] on 12/04/16 and felt that eliquis was making him winded, and therefore switched to xarelto. He experienced significant GI side effects with this so he changed back to Eliquis.  He was admitted to the hospital for a new blood clot after not taking eliquis. He was then seen by hematology service, Dr. Smith Robert on 01/04/17, at that time it was noted that Eliquis could not be adequately monitored given his morbid obesity, therefore it was recommended he switch to Coumadin. He was seen at [facility] hematology on 06/27/17 and it was noted that he did not have any definable pro-thrombotic state.  He has been taking Coumadin at 9 milligrams daily since that time.  He admits that he does not always go to check his INR as frequently as recommended.  Recently he has been trying to lose weight, he went on a low-carb diet and has been eating more greens.  At the time of admission INR was 1.23.  He presented to the hospital with sudden onset  right-sided sharp pleuritic type chest pain, as well as increasing shortness of breath.  Review of most recent imaging, imaging personally reviewed, CT chest 08/03/17, and comparison with previous 01/03/17 there is persistence of bilateral thromboembolic disease.   PMHX:   Past Medical History:  Diagnosis Date  . CHF (congestive heart failure) (HCC)   . Diabetes mellitus without complication (HCC)    per pt-pre diabetic-Dr Ryan [name] stated said pt  . DVT (deep venous thrombosis) (HCC)   .  Hypertension   . Peripheral vascular disease (HCC)   . Presence of IVC filter    Surgical Hx:  Past Surgical History:  Procedure Laterality Date  . PERIPHERAL VASCULAR CATHETERIZATION N/A 01/10/2015   Procedure: Dialysis/Perma Catheter Insertion;  Surgeon: Ryan [name];  Location: [facility] INVASIVE CV LAB;  Service: Cardiovascular;  Laterality: N/A;  . PERIPHERAL VASCULAR CATHETERIZATION N/A 02/24/2015   Procedure: Dialysis/Perma Catheter Removal;  Surgeon: Ryan [name];  Location: [facility] INVASIVE CV LAB;  Service: Cardiovascular;  Laterality: N/A;   Family Hx:  Family History  Problem Relation Age of Onset  . Hypertension Mother   . Cancer Mother   . Breast cancer Mother   . Prostate cancer Father   . Hypertension Father   . Diabetes Father    Social Hx:   Social History   Tobacco Use  . Smoking status: Never Smoker  . Smokeless tobacco: Never Used  Substance Use Topics  . Alcohol use: No  . Drug use: No   Medication:    Current Facility-Administered Medications:  .  acetaminophen (TYLENOL) tablet 650 mg, 650 mg, Oral, Q6H PRN **OR** acetaminophen (TYLENOL) suppository 650 mg, 650 mg, Rectal, Q6H PRN, Ryan [name] .  azithromycin (ZITHROMAX) 500 mg in dextrose 5 % 250 mL IVPB, 500 mg, Intravenous, Q24H, Ryan [name], Stopped at 08/04/17 0035 .  cefTRIAXone (ROCEPHIN) 2 g in dextrose 5 % 50 mL IVPB, 2 g, Intravenous, Q24H, Ryan [name], Stopped at 08/04/17 0155 .  heparin ADULT infusion 100 units/mL (25000 units/256mL sodium chloride 0.45%), 2,000 Units/hr, Intravenous, Continuous, Ryan [name], [facility], Last Rate: 20 mL/hr at 08/04/17 0739, 2,000 Units/hr at 08/04/17 0739 .  Influenza vac split quadrivalent PF (FLUARIX) injection 0.5 mL, 0.5 mL, Intramuscular, Tomorrow-1000, Ryan [name] .  lisinopril (PRINIVIL,ZESTRIL) tablet 10 mg, 10 mg, Oral, Daily, Ryan [name], 10 mg at 08/04/17 0854 .  morphine 4 MG/ML injection 4 mg, 4 mg, Intravenous, Q4H  PRN, Ryan [name], 4 mg at 08/04/17 0733 .  ondansetron (ZOFRAN) tablet 4 mg, 4 mg, Oral, Q6H PRN **OR** ondansetron (ZOFRAN) injection 4 mg, 4 mg, Intravenous, Q6H PRN, Ryan [name] .  oxyCODONE (Oxy IR/ROXICODONE) immediate release tablet 5 mg, 5 mg, Oral, Q4H PRN, Ryan [name] .  Melene Muller ON 08/05/2017] pneumococcal 23 valent vaccine (PNU-IMMUNE) injection 0.5 mL, 0.5 mL, Intramuscular, Tomorrow-1000, Ryan [name]   Allergies:  Clindamycin/lincomycin and Sulfa antibiotics  Review of Systems: Gen:  Denies  fever, sweats, chills HEENT: Denies blurred vision, double vision. bleeds, sore throat Cvc:  No dizziness, chest pain. Resp:   Denies cough or sputum production, shortness of breath Gi: Denies swallowing difficulty, stomach pain. Gu:  Denies bladder incontinence, burning urine Ext:   No Joint pain, stiffness. Skin: No skin rash,  hives  Endoc:  No polyuria, polydipsia. Psych: No depression, insomnia. Other:  All other systems were reviewed with the patient and were negative other that what is mentioned in the HPI.  Physical Examination:   VS: BP 135/65   Pulse 80   Temp 98.6 F (37 C) (Oral)   Resp 18   Ht 5\' 7"  (1.702 m)   Wt (!) 346 lb 14.4 oz (157.4 kg)   SpO2 95%   BMI 54.33 kg/m   General Appearance: No distress, morbid obesity.  Neuro:without focal findings,  speech normal,  HEENT: PERRLA, EOM intact.   Pulmonary: normal breath sounds, No wheezing.  CardiovascularNormal S1,S2.  No m/r/g.   Abdomen: Benign, Soft, non-tender. Renal:  No costovertebral tenderness  GU:  No performed at this time. Endoc: No evident thyromegaly, no signs of acromegaly. Skin:   warm, no rashes, no ecchymosis  Extremities: normal, no cyanosis, clubbing.  Other findings:    LABORATORY PANEL:   CBC Recent Labs  Lab 08/04/17 0216  WBC 7.2  HGB 13.9  HCT 42.2  PLT 263    ------------------------------------------------------------------------------------------------------------------  Chemistries  Recent Labs  Lab 08/04/17 0216  NA 135  K 3.8  CL 103  CO2 26  GLUCOSE 96  BUN 17  CREATININE 1.53*  CALCIUM 8.5*   ------------------------------------------------------------------------------------------------------------------  Cardiac Enzymes Recent Labs  Lab 08/03/17 1736  TROPONINI <0.03   ------------------------------------------------------------  RADIOLOGY:  Ct Angio Chest Pe W And/or Wo Contrast  Result Date: 08/03/2017 CLINICAL DATA:  Chest pain. Shortness of breath. History of pulmonary embolus EXAM: CT ANGIOGRAPHY CHEST WITH CONTRAST TECHNIQUE: Multidetector CT imaging of the chest was performed using the standard protocol during bolus administration of intravenous contrast. Multiplanar CT image reconstructions and MIPs were obtained to evaluate the vascular anatomy. CONTRAST:  ISOVUE-370 IOPAMIDOL (ISOVUE-370) INJECTION 76% COMPARISON:  CT angiogram chest Jan 03, 2017; chest radiograph August 03, 2017 FINDINGS: Cardiovascular: There is again noted extensive pulmonary embolus on the right extending from the anterior aspect of the right main pulmonary outflow tract with extension into multiple right upper and lower lobe pulmonary arterial branches, essentially stable compared to 6 months prior. On the right, there are pulmonary emboli in both proximal upper and lower lobe pulmonary artery branches as well as in several more peripheral right lower extremity arterial branches. The appearance is essentially stable compared to the prior study. The right ventricle to left ventricle diameter ratio is 1.3, consistent with right heart strain. There is no appreciable thoracic aortic aneurysm or dissection. The visualized great vessels appear unremarkable. The main pulmonary outflow tract measures 3.4 cm, abnormally large and felt to be indicative  of pulmonary arterial hypertension. Pericardium is not appreciably thickened. Mediastinum/Nodes: Visualized thyroid appears normal. No adenopathy by size criteria is evident. A few subcentimeter mediastinal lymph nodes are present. No esophageal lesions are evident. Lungs/Pleura: There is airspace consolidation in a portion of the medial segment of the right middle lobe. There is atelectatic change in both lower lobes. The lungs elsewhere are clear. No pleural effusions are evident. Upper Abdomen: In the visualized upper abdomen, there is incomplete visualization of a filter within the inferior vena cava. There is hepatic steatosis. Visualized upper abdominal structures otherwise appear unremarkable. Musculoskeletal: There is degenerative change in the lower thoracic spine. No blastic or lytic bone lesions. Review of the MIP images confirms the above findings. IMPRESSION: 1. Extensive pulmonary embolus bilaterally, more pronounced on the right than on the left, essentially stable compared to study from 6 months prior. Question persistence of pulmonary embolus compared to prior study versus recurrent pulmonary embolus. Both entities may exist concurrently. The essentially identical distribution of pulmonary embolus suggests that a significant portion  of the pulmonary embolus present is chronic. There is evidence of right heart strain with a right ventricle to left ventricular diameter ratio of 1.3. Note that this finding was present on previous study and is essentially stable. Positive for acute PE with CT evidence of right heart strain (RV/LV Ratio = 1.3) consistent with at least submassive (intermediate risk) PE. The presence of right heart strain has been associated with an increased risk of morbidity and mortality. Please activate Code PE by paging [phone]. 2. Prominence of the main pulmonary outflow tract, felt to be indicative of pulmonary arterial hypertension. 3. Airspace opacity in the medial segment of  the right middle lobe. The appearance is suggestive of pneumonia. A degree of pulmonary infarct in this area is possible. Pneumonia in an area of infarct is also possible. No other airspace consolidations seen. There is patchy lower lobe atelectasis bilaterally. 4.  No evident adenopathy. 5.  Inferior vena cava filter present, incompletely visualized. 6.  There is hepatic steatosis. Critical Value/emergent results were called by telephone at the time of interpretation on 08/03/2017 at 7:25 pm to Dr. Nita SickleAROLINA VERONESE , who verbally acknowledged these results. Electronically Signed   By: Bretta BangWilliam  Woodruff III M.D.   On: 08/03/2017 19:27   [place] Venous Img Lower Bilateral  Result Date: 08/04/2017 CLINICAL DATA:  Shortness of breath, lower extremity edema and history of prior DVT and pulmonary embolism. EXAM: BILATERAL LOWER EXTREMITY VENOUS DOPPLER ULTRASOUND TECHNIQUE: Gray-scale sonography with graded compression, as well as color Doppler and duplex ultrasound were performed to evaluate the lower extremity deep venous systems from the level of the common femoral vein and including the common femoral, femoral, profunda femoral, popliteal and calf veins including the posterior tibial, peroneal and gastrocnemius veins when visible. The superficial great saphenous vein was also interrogated. Spectral Doppler was utilized to evaluate flow at rest and with distal augmentation maneuvers in the common femoral, femoral and popliteal veins. COMPARISON:  11/22/2016 FINDINGS: RIGHT LOWER EXTREMITY Common Femoral Vein: No evidence of thrombus. Normal compressibility, respiratory phasicity and response to augmentation. Saphenofemoral Junction: No evidence of thrombus. Normal compressibility and flow on color Doppler imaging. Profunda Femoral Vein: No evidence of thrombus. Normal compressibility and flow on color Doppler imaging. Femoral Vein: The femoral vein demonstrates a mild amount of echogenic nonocclusive mural thrombus  causing luminal narrowing. Findings appear similar to ultrasound findings in March and likely represents chronic mural thrombus. Popliteal Vein: Nonocclusive thrombus causes significant luminal narrowing. Findings are similar to the prior ultrasound and appear to largely represent chronic thrombus. Calf Veins: No evidence of thrombus. Normal compressibility and flow on color Doppler imaging. Superficial Great Saphenous Vein: No evidence of thrombus. Normal compressibility. Venous Reflux:  None. Other Findings: No evidence of superficial thrombophlebitis or abnormal fluid collection. LEFT LOWER EXTREMITY Common Femoral Vein: Nonocclusive chronic mural thrombus identified. Saphenofemoral Junction: No evidence of thrombus. Normal compressibility and flow on color Doppler imaging. Profunda Femoral Vein: Nonocclusive chronic mural thrombus identified. Femoral Vein: Extensive nonocclusive thrombus with patent flow present. Although largely felt to represent chronic thrombus, a component of acute thrombus in the left femoral vein cannot be entirely excluded. Popliteal Vein: Nonocclusive thrombus causes luminal narrowing. Similar to the femoral vein, this may represent chronic thrombus but a component of acute thrombus cannot be entirely excluded. Calf Veins: No evidence of thrombus. Normal compressibility and flow on color Doppler imaging. Superficial Great Saphenous Vein: No evidence of thrombus. Normal compressibility. Venous Reflux:  None. Other Findings: No evidence of superficial  thrombophlebitis or abnormal fluid collection. IMPRESSION: 1. Nonocclusive thrombus in the right femoral and popliteal veins causes luminal stenosis and appears similar to the March study. This appears to largely represent chronic thrombus in the right lower extremity. 2. More significant nonocclusive thrombus in the left lower extremity, especially in the femoral and popliteal segments. Although felt to be likely largely chronic, a component  of acute thrombus cannot be entirely excluded in the femoral vein and popliteal vein. In the common femoral and profunda femoral veins, thrombus appears more likely representative of stable chronic thrombus. Electronically Signed   By: Irish LackGlenn  Yamagata M.D.   On: 08/04/2017 13:05   Dg Chest Portable 1 View  Result Date: 08/03/2017 CLINICAL DATA:  Chest pain since 1400 hours today. EXAM: PORTABLE CHEST 1 VIEW COMPARISON:  01/03/2017 FINDINGS: The heart size and mediastinal contours are within normal limits. Low lung volumes are noted. No pneumonic consolidation or CHF. No effusion or pneumothorax. Stable mild right hilar prominence believed related to vascular prominence. The visualized skeletal structures are unremarkable. IMPRESSION: Low lung volumes without active pulmonary disease Electronically Signed   By: Tollie Ethavid  Kwon M.D.   On: 08/03/2017 18:17       Thank  you for the consultation and for allowing [facility] to assist in the care of your patient. Our recommendations are noted above.  Please contact us if we can be of further service.   Wells Guileseep Dyke Weible, [name].  Board Certified in Internal Medicine, Pulmonary Medicine, Critical Care Medicine, and Sleep Medicine.  [facility] Office Number: [phone]  Santiago Gladavid Kasa, M.D.  Billy Fischeravid Simonds, M.D  08/04/2017

## 2017-08-04 NOTE — Care Management (Signed)
Patient has a previous history of pulmonary embolus and DVT but now with increase in shortness of breath and chest pain.  Chronic coumadin but INR sub therapeutic. Recent dieting with increase in greens. Verbalizes compliance with medication administration Due to ongoing symptoms, vascular to perform pulmonary thrombolysis.  Current oxygen requirements are acute

## 2017-08-04 NOTE — Consult Note (Signed)
[facility] Cardiology  CARDIOLOGY CONSULT NOTE  Patient ID: Ryan [name] MRN: [id number] DOB/AGE: [birth year] 47 y.o.  Admit date: 08/03/2017  Referring Physician Anne Hahn Primary Physician Sherrie Mustache, MD  Primary Cardiologist [facility] Reason for Consultation Right heart strain with pulmonary embolism  HPI: 46 year old male referred for evaluation of right heart strain with pulmonary embolism.  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  The patient presented to [facility] ER yesterday for acute onset of right-sided, sharp chest pain, pleuritic in nature, with associated shortness of breath, without nausea, radiation, or diaphoresis.  Chest CTA revealed extensive pulmonary embolus bilaterally, which is essentially stable compared to study from 6 months ago, with questionable recurrent pulmonary embolus, and right heart strain, which was present on previous study and is essentially stable, as well as questionable pneumonia.  The patient was started on heparin drip and IV antibiotic for pneumonia.  Admission labs notable for subtherapeutic INR of 1.23, negative troponin.  ECG revealed normal sinus rhythm at a rate of 93 bpm with PVC. Currently, the patient reports shortness of breath, fatigue, and right-sided chest discomfort with deep breathing. He denies calf tenderness.  Review of systems complete and found to be negative unless listed above     Past Medical History:  Diagnosis Date  . CHF (congestive heart failure) (HCC)   . Diabetes mellitus without complication (HCC)    per pt-pre diabetic-Dr Dario Guardian stated said pt  . DVT (deep venous thrombosis) (HCC)   . Hypertension   . Peripheral vascular disease (HCC)   . Presence of IVC filter     Past Surgical History:  Procedure Laterality Date  .  PERIPHERAL VASCULAR CATHETERIZATION N/A 01/10/2015   Procedure: Dialysis/Perma Catheter Insertion;  Surgeon: Renford Dills, MD;  Location: [facility] INVASIVE CV LAB;  Service: Cardiovascular;  Laterality: N/A;  . PERIPHERAL VASCULAR CATHETERIZATION N/A 02/24/2015   Procedure: Dialysis/Perma Catheter Removal;  Surgeon: Annice Needy, MD;  Location: [facility] INVASIVE CV LAB;  Service: Cardiovascular;  Laterality: N/A;    Medications Prior to Admission  Medication Sig Dispense Refill Last Dose  . hydrochlorothiazide (HYDRODIURIL) 25 MG tablet Take 1 tablet (25 mg total) by mouth daily. 30 tablet 0 08/03/2017 at Unknown time  . lisinopril (PRINIVIL,ZESTRIL) 10 MG tablet Take 1 tablet (10 mg total) by mouth daily. 30 tablet 0 08/03/2017 at Unknown time  . Vitamin D, Ergocalciferol, (DRISDOL) 50000 units CAPS capsule Take 50,000 Units by mouth every 7 (seven) days.   Past Week at Unknown time  . warfarin (COUMADIN) 3 MG tablet Take 2 tablets (6 mg total) by mouth one time only at 6 PM. (Patient taking differently: Take 9 mg by mouth daily. ) 60 tablet 0 08/03/2017 at Unknown time  . enoxaparin (LOVENOX) 150 MG/ML injection Inject 1 mL (150 mg total) into the skin every 12 (twelve) hours. (Patient not taking: Reported on 02/25/2017) 10 Syringe 0 Not Taking at Unknown time   Social History   Socioeconomic History  . Marital status: Married    Spouse name: Not on file  . Number of children: Not on file  . Years of education: Not on file  . Highest education level: Not on file  Social Needs  . Financial resource strain: Not on file  . Food insecurity - worry: Not on file  . Food  insecurity - inability: Not on file  . Transportation needs - medical: Not on file  . Transportation needs - non-medical: Not on file  Occupational History  . Not on file  Tobacco Use  . Smoking status: Never Smoker  . Smokeless tobacco: Never Used  Substance and Sexual Activity  . Alcohol use: No  . Drug use: No  . Sexual  activity: Yes  Other Topics Concern  . Not on file  Social History Narrative  . Not on file    Family History  Problem Relation Age of Onset  . Hypertension Mother   . Cancer Mother   . Breast cancer Mother   . Prostate cancer Father   . Hypertension Father   . Diabetes Father       Review of systems complete and found to be negative unless listed above      PHYSICAL EXAM  General: Well developed, well nourished, obese, lying flat in bed, in no acute distress HEENT:  Normocephalic and atramatic Neck:  Supple Lungs: Taking shallow, guarded breaths, no accessory respiratory muscle use, able to talk in full sentences, on supplemental oxygen, no wheezing. Heart: HRRR . Normal S1 and S2 without gallops or murmurs.  Abdomen: Bowel sounds are positive, abdomen soft  Msk: Patient lying in bed, gait not assessed.  Normal strength and tone for age. Extremities: 1+ bilateral lower extremity edema Neuro: Alert and oriented X 3. Psych:  Good affect, responds appropriately  Labs:   Lab Results  Component Value Date   WBC 7.2 08/04/2017   HGB 13.9 08/04/2017   HCT 42.2 08/04/2017   MCV 91.2 08/04/2017   PLT 263 08/04/2017    Recent Labs  Lab 08/04/17 0216  NA 135  K 3.8  CL 103  CO2 26  BUN 17  CREATININE 1.53*  CALCIUM 8.5*  GLUCOSE 96   Lab Results  Component Value Date   CKTOTAL 263 12/20/2014   CKMB 1.5 12/20/2014   TROPONINI <0.03 08/03/2017    Lab Results  Component Value Date   CHOL 187 12/20/2014   Lab Results  Component Value Date   HDL 39 (L) 12/20/2014   Lab Results  Component Value Date   LDLCALC 128 (H) 12/20/2014   Lab Results  Component Value Date   TRIG 101 12/20/2014   No results found for: CHOLHDL No results found for: LDLDIRECT    Radiology: Ct Angio Chest Pe W And/or Wo Contrast  Result Date: 08/03/2017 CLINICAL DATA:  Chest pain. Shortness of breath. History of pulmonary embolus EXAM: CT ANGIOGRAPHY CHEST WITH CONTRAST  TECHNIQUE: Multidetector CT imaging of the chest was performed using the standard protocol during bolus administration of intravenous contrast. Multiplanar CT image reconstructions and MIPs were obtained to evaluate the vascular anatomy. CONTRAST:  100mL ISOVUE-370 IOPAMIDOL (ISOVUE-370) INJECTION 76% COMPARISON:  CT angiogram chest Jan 03, 2017; chest radiograph August 03, 2017 FINDINGS: Cardiovascular: There is again noted extensive pulmonary embolus on the right extending from the anterior aspect of the right main pulmonary outflow tract with extension into multiple right upper and lower lobe pulmonary arterial branches, essentially stable compared to 6 months prior. On the right, there are pulmonary emboli in both proximal upper and lower lobe pulmonary artery branches as well as in several more peripheral right lower extremity arterial branches. The appearance is essentially stable compared to the prior study. The right ventricle to left ventricle diameter ratio is 1.3, consistent with right heart strain. There is no appreciable thoracic aortic aneurysm  or dissection. The visualized great vessels appear unremarkable. The main pulmonary outflow tract measures 3.4 cm, abnormally large and felt to be indicative of pulmonary arterial hypertension. Pericardium is not appreciably thickened. Mediastinum/Nodes: Visualized thyroid appears normal. No adenopathy by size criteria is evident. A few subcentimeter mediastinal lymph nodes are present. No esophageal lesions are evident. Lungs/Pleura: There is airspace consolidation in a portion of the medial segment of the right middle lobe. There is atelectatic change in both lower lobes. The lungs elsewhere are clear. No pleural effusions are evident. Upper Abdomen: In the visualized upper abdomen, there is incomplete visualization of a filter within the inferior vena cava. There is hepatic steatosis. Visualized upper abdominal structures otherwise appear unremarkable.  Musculoskeletal: There is degenerative change in the lower thoracic spine. No blastic or lytic bone lesions. Review of the MIP images confirms the above findings. IMPRESSION: 1. Extensive pulmonary embolus bilaterally, more pronounced on the right than on the left, essentially stable compared to study from 6 months prior. Question persistence of pulmonary embolus compared to prior study versus recurrent pulmonary embolus. Both entities may exist concurrently. 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 There is evidence of right heart strain with a right ventricle to left ventricular diameter ratio of 1.3. Note that this finding was present on previous study and is essentially stable. Positive for acute PE with CT evidence of right heart strain (RV/LV Ratio = 1.3) consistent with at least submassive (intermediate risk) PE. The presence of right heart strain has been associated with an increased risk of morbidity and mortality. Please activate Code PE by paging [phone]. 2. Prominence of the main pulmonary outflow tract, felt to be indicative of pulmonary arterial hypertension. 3. Airspace opacity in the medial segment of the right middle lobe. The appearance is suggestive of pneumonia. A degree of pulmonary infarct in this area is possible. Pneumonia in an area of infarct is also possible. No other airspace consolidations seen. There is patchy lower lobe atelectasis bilaterally. 4.  No evident adenopathy. 5.  Inferior vena cava filter present, incompletely visualized. 6.  There is hepatic steatosis. Critical Value/emergent results were called by telephone at the time of interpretation on 08/03/2017 at 7:25 pm to Dr. Nita Sickle , who verbally acknowledged these results. Electronically Signed   By: Bretta Bang III M.D.   On: 08/03/2017 19:27   Dg Chest Portable 1 View  Result Date: 08/03/2017 CLINICAL  DATA:  Chest pain since 1400 hours today. EXAM: PORTABLE CHEST 1 VIEW COMPARISON:  01/03/2017 FINDINGS: The heart size and mediastinal contours are within normal limits. Low lung volumes are noted. No pneumonic consolidation or CHF. No effusion or pneumothorax. Stable mild right hilar prominence believed related to vascular prominence. The visualized skeletal structures are unremarkable. IMPRESSION: Low lung volumes without active pulmonary disease Electronically Signed   By: Tollie Eth M.D.   On: 08/03/2017 18:17    EKG: Sinus rhythm, rate 92 bpm  ASSESSMENT AND PLAN:  1.  Right heart strain secondary to acute and chronic pulmonary embolus bilaterally, on chronic anticoagulation with an IVC filter. He is currently on heparin drip. CTA reveals evidence of right heart strain with a right ventricle to left ventricle diameter ratio of 1.3.  Report states that this finding was present on previous study and is essentially stable. The patient had previously been on Eliquis, but per his hematologist, Dr. Smith Robert, because of his body mass, Eliquis was deemed  less effective. The patient has remained on warfarin; INR subtherapeutic at 1.23.  2.  Pneumonia, on IV antibiotic 3.  Essential hypertension, stable on lisinopril 4.  Type 2 diabetes  Recommendations: 1.  Agree with current therapy 2.  Continue heparin drip 3.  Continue lisinopril 4.  Review 2D echocardiogram  Sign off for now; call with any questions.   Signed: Leanora Ivanoffnna Drane PA-C 08/04/2017, 8:42 AM

## 2017-08-04 NOTE — Progress Notes (Signed)
ANTICOAGULATION CONSULT NOTE - Initial Consult  Pharmacy Consult for heparin gtt Indication: pulmonary embolus  Allergies  Allergen Reactions  . Clindamycin/Lincomycin Rash  . Sulfa Antibiotics Rash    Patient Measurements: Height: 5\' 7"  (170.2 cm) Weight: (!) 346 lb (156.9 kg) IBW/kg (Calculated) : 66.1 Heparin Dosing Weight: 105 kg   Vital Signs: Temp: 99.9 F (37.7 C) (12/06 1348) Temp Source: Oral (12/06 1348) BP: 112/75 (12/06 1348) Pulse Rate: 83 (12/06 1348)  Labs: Recent Labs    08/03/17 1736 08/03/17 1757 08/04/17 0216 08/04/17 1613  HGB 15.5  --  13.9  --   HCT 47.5  --  42.2  --   PLT 300  --  263  --   APTT  --  27  --   --   LABPROT 15.4*  --   --   --   INR 1.23  --   --   --   HEPARINUNFRC  --   --  0.13* <0.10*  CREATININE 1.48*  --  1.53*  --   TROPONINI <0.03  --   --   --     Estimated Creatinine Clearance: 86.4 mL/min (A) (by C-G formula based on SCr of 1.53 mg/dL (H)).   Medical History: Past Medical History:  Diagnosis Date  . CHF (congestive heart failure) (HCC)   . Diabetes mellitus without complication (HCC)    per pt-pre diabetic-Dr Dario GuardianJadali stated said pt  . DVT (deep venous thrombosis) (HCC)   . Hypertension   . Peripheral vascular disease (HCC)   . Presence of IVC filter     Assessment: 46 year old male with large PE, presents subtherapeutic on warfarin 9mg  daily (per patient). INR 1.2. Heparin drip was started in ED - pharmacy consulted to monitor and adjust as needed.   Goal of Therapy:  Heparin level 0.3-0.7 units/ml Monitor platelets by anticoagulation protocol: Yes   Plan:  Heparin gtt was stopped for ~1.5 hours. Heparin gtt restarted @ 1530. Will check Heparin level @ 2130.   Demetrius Charityeldrin D. Hildred Mollica, PharmD  Clinical Pharmacist 08/04/2017,7:08 PM

## 2017-08-04 NOTE — Progress Notes (Signed)
ANTICOAGULATION CONSULT NOTE - Initial Consult  Pharmacy Consult for heparin gtt Indication: pulmonary embolus  Allergies  Allergen Reactions  . Clindamycin/Lincomycin Rash  . Sulfa Antibiotics Rash    Patient Measurements: Height: 5\' 7"  (170.2 cm) Weight: (!) 346 lb 14.4 oz (157.4 kg) IBW/kg (Calculated) : 66.1 Heparin Dosing Weight: 105 kg   Vital Signs: Temp: 98.6 F (37 C) (12/06 0417) Temp Source: Oral (12/06 0417) BP: 124/81 (12/06 0417) Pulse Rate: 77 (12/06 0417)  Labs: Recent Labs    08/03/17 1736 08/03/17 1757 08/04/17 0216  HGB 15.5  --  13.9  HCT 47.5  --  42.2  PLT 300  --  263  APTT  --  27  --   LABPROT 15.4*  --   --   INR 1.23  --   --   HEPARINUNFRC  --   --  0.13*  CREATININE 1.48*  --  1.53*  TROPONINI <0.03  --   --     Estimated Creatinine Clearance: 86.6 mL/min (A) (by C-G formula based on SCr of 1.53 mg/dL (H)).   Medical History: Past Medical History:  Diagnosis Date  . CHF (congestive heart failure) (HCC)   . Diabetes mellitus without complication (HCC)    per pt-pre diabetic-Dr Dario GuardianJadali stated said pt  . DVT (deep venous thrombosis) (HCC)   . Hypertension   . Peripheral vascular disease (HCC)   . Presence of IVC filter     Assessment: 46 year old male with large PE, presents subtherapeutic on warfarin 9mg  daily (per patient). INR 1.2. Heparin drip was started in ED - pharmacy consulted to monitor and adjust as needed.   Goal of Therapy:  Heparin level 0.3-0.7 units/ml Monitor platelets by anticoagulation protocol: Yes   Plan:  HL = 0.13 was subtherapeutic on 1700 units/hr. Ordered heparin bolus of 3100 units and increased infusion to 2000 units/hr. Will check HL in 6 hours. CBC ordered with AM labs tomorrow.  Cindi CarbonMary M Kshawn Canal, PharmD, BCPS Clinical Pharmacist 08/04/2017,7:19 AM

## 2017-08-04 NOTE — Plan of Care (Signed)
Pt is A&Ox4. VSS. 4L O2 [place] . Cardiac monitoring continued, pt remains in NSR thus far this shift. Family at bedside. Heparin gtt infusing per order. Pt voids in urinal. Pain managed with prn morphine per order. Pt went for u/s of BLE this shift. Will continue to monitor and report to oncoming Rn.

## 2017-08-04 NOTE — Progress Notes (Signed)
ANTICOAGULATION CONSULT NOTE - Initial Consult  Pharmacy Consult for heparin gtt Indication: pulmonary embolus  Allergies  Allergen Reactions  . Clindamycin/Lincomycin Rash  . Sulfa Antibiotics Rash    Patient Measurements: Height: 5\' 7"  (170.2 cm) Weight: (!) 346 lb (156.9 kg) IBW/kg (Calculated) : 66.1 Heparin Dosing Weight: 105 kg   Vital Signs: Temp: 98.6 F (37 C) (12/06 2028) Temp Source: Oral (12/06 2028) BP: 121/73 (12/06 2028) Pulse Rate: 84 (12/06 2028)  Labs: Recent Labs    08/03/17 1736 08/03/17 1757 08/04/17 0216 08/04/17 1613  HGB 15.5  --  13.9  --   HCT 47.5  --  42.2  --   PLT 300  --  263  --   APTT  --  27  --   --   LABPROT 15.4*  --   --   --   INR 1.23  --   --   --   HEPARINUNFRC  --   --  0.13* <0.10*  CREATININE 1.48*  --  1.53*  --   TROPONINI <0.03  --   --   --     Estimated Creatinine Clearance: 86.4 mL/min (A) (by C-G formula based on SCr of 1.53 mg/dL (H)).   Medical History: Past Medical History:  Diagnosis Date  . CHF (congestive heart failure) (HCC)   . Diabetes mellitus without complication (HCC)    per pt-pre diabetic-Dr Dario GuardianJadali stated said pt  . DVT (deep venous thrombosis) (HCC)   . Hypertension   . Peripheral vascular disease (HCC)   . Presence of IVC filter     Assessment: 46 year old male with large PE, presents subtherapeutic on warfarin 9mg  daily (per patient). INR 1.2. Heparin drip was started in ED - pharmacy consulted to monitor and adjust as needed.   Goal of Therapy:  Heparin level 0.3-0.7 units/ml Monitor platelets by anticoagulation protocol: Yes   Plan:  Heparin gtt was stopped for ~1.5 hours. Heparin gtt restarted @ 1530. Will check Heparin level @ 2130.   12/6 @ 2130 HL < 0.10 subtherapeutic. Will rebolus w/ heparin 3000 units IV x 1 and increase rate to 2200 units/hr and recheck HL/CBC w/ am labs.  Thomasene Rippleavid Dayshawn Irizarry, PharmD, BCPS Clinical Pharmacist 08/04/2017

## 2017-08-04 NOTE — Progress Notes (Signed)
Pharmacy Antibiotic Note  Ryan [name] is a 46 y.o. male admitted on 08/03/2017 with pneumonia.  Pharmacy has been consulted for ceftriaxone dosing.  Plan: Ceftriaxone 2g IV daily for PNA  Height: 5\' 7"  (170.2 cm) Weight: (!) 346 lb 14.4 oz (157.4 kg) IBW/kg (Calculated) : 66.1  Temp (24hrs), Avg:99.1 F (37.3 C), Min:98.4 F (36.9 C), Max:99.7 F (37.6 C)  Recent Labs  Lab 08/03/17 1736  WBC 6.6  CREATININE 1.48*    Estimated Creatinine Clearance: 89.5 mL/min (A) (by C-G formula based on SCr of 1.48 mg/dL (H)).    Allergies  Allergen Reactions  . Clindamycin/Lincomycin Rash  . Sulfa Antibiotics Rash    Thank you for allowing pharmacy to be a part of this patient's care.  Thomasene Rippleavid Xavius Spadafore, PharmD, BCPS Clinical Pharmacist 08/04/2017

## 2017-08-04 NOTE — Progress Notes (Signed)
[facility] [place] [facility]   PATIENT NAME: Ryan [name]    MR#:  [id number]  DATE OF BIRTH:  07-17-1971  SUBJECTIVE:   Patient here due to shortness of breath and pleuritic chest pain secondary to pulmonary embolism with infarct. Patient has a previous history of PE but has been noncompliant with anticoagulation. He now presents with chest pain secondary to pleurisy/pulmonary infarct.  REVIEW OF SYSTEMS:    Review of Systems  Constitutional: Negative for chills and fever.  HENT: Negative for congestion and tinnitus.   Eyes: Negative for blurred vision and double vision.  Respiratory: Positive for shortness of breath. Negative for cough and wheezing.   Cardiovascular: Positive for chest pain. Negative for orthopnea and PND.  Gastrointestinal: Negative for abdominal pain, diarrhea, nausea and vomiting.  Genitourinary: Negative for dysuria and hematuria.  Neurological: Negative for dizziness, sensory change and focal weakness.  All other systems reviewed and are negative.   Nutrition: Heart Healthy Tolerating Diet: Yes Tolerating PT: Await Eval.   DRUG ALLERGIES:   Allergies  Allergen Reactions  . Clindamycin/Lincomycin Rash  . Sulfa Antibiotics Rash    VITALS:  Blood pressure 112/75, pulse 83, temperature 99.9 F (37.7 C), temperature source Oral, resp. rate 20, height 5\' 7"  (1.702 m), weight (!) 156.9 kg (346 lb), SpO2 92 %.  PHYSICAL EXAMINATION:   Physical Exam  GENERAL:  46 y.o.-year-old obese patient lying in bed in mild Resp. Distress.  EYES: Pupils equal, round, reactive to light and accommodation. No scleral icterus. Extraocular muscles intact.  HEENT: Head atraumatic, normocephalic. Oropharynx and nasopharynx clear.  NECK:  Supple, no jugular venous distention. No thyroid enlargement, no tenderness.  LUNGS: Poor Resp. effort, no wheezing, rales, rhonchi. No use of accessory muscles of respiration.  CARDIOVASCULAR: S1, S2 normal. No  murmurs, rubs, or gallops.  ABDOMEN: Soft, nontender, nondistended. Bowel sounds present. No organomegaly or mass.  EXTREMITIES: No cyanosis, clubbing or edema b/l.    NEUROLOGIC: Cranial nerves II through XII are intact. No focal Motor or sensory deficits b/l.  Globally weak.  PSYCHIATRIC: The patient is alert and oriented x 3.  SKIN: No obvious rash, lesion, or ulcer.    LABORATORY PANEL:   CBC Recent Labs  Lab 08/04/17 0216  WBC 7.2  HGB 13.9  HCT 42.2  PLT 263   ------------------------------------------------------------------------------------------------------------------  Chemistries  Recent Labs  Lab 08/04/17 0216  NA 135  K 3.8  CL 103  CO2 26  GLUCOSE 96  BUN 17  CREATININE 1.53*  CALCIUM 8.5*   ------------------------------------------------------------------------------------------------------------------  Cardiac Enzymes Recent Labs  Lab 08/03/17 1736  TROPONINI <0.03   ------------------------------------------------------------------------------------------------------------------  RADIOLOGY:  Ct Angio Chest Pe W And/or Wo Contrast  Result Date: 08/03/2017 CLINICAL DATA:  Chest pain. Shortness of breath. History of pulmonary embolus EXAM: CT ANGIOGRAPHY CHEST WITH CONTRAST TECHNIQUE: Multidetector CT imaging of the chest was performed using the standard protocol during bolus administration of intravenous contrast. Multiplanar CT image reconstructions and MIPs were obtained to evaluate the vascular anatomy. CONTRAST:  ISOVUE-370 IOPAMIDOL (ISOVUE-370) INJECTION 76% COMPARISON:  CT angiogram chest Jan 03, 2017; chest radiograph August 03, 2017 FINDINGS: Cardiovascular: There is again noted extensive pulmonary embolus on the right extending from the anterior aspect of the right main pulmonary outflow tract with extension into multiple right upper and lower lobe pulmonary arterial branches, essentially stable compared to 6 months prior. On the right,  there are pulmonary emboli in both proximal upper and lower lobe pulmonary artery  branches as well as in several more peripheral right lower extremity arterial branches. The appearance is essentially stable compared to the prior study. The right ventricle to left ventricle diameter ratio is 1.3, consistent with right heart strain. There is no appreciable thoracic aortic aneurysm or dissection. The visualized great vessels appear unremarkable. The main pulmonary outflow tract measures 3.4 cm, abnormally large and felt to be indicative of pulmonary arterial hypertension. Pericardium is not appreciably thickened. Mediastinum/Nodes: Visualized thyroid appears normal. No adenopathy by size criteria is evident. A few subcentimeter mediastinal lymph nodes are present. No esophageal lesions are evident. Lungs/Pleura: There is airspace consolidation in a portion of the medial segment of the right middle lobe. There is atelectatic change in both lower lobes. The lungs elsewhere are clear. No pleural effusions are evident. Upper Abdomen: In the visualized upper abdomen, there is incomplete visualization of a filter within the inferior vena cava. There is hepatic steatosis. Visualized upper abdominal structures otherwise appear unremarkable. Musculoskeletal: There is degenerative change in the lower thoracic spine. No blastic or lytic bone lesions. Review of the MIP images confirms the above findings. IMPRESSION: 1. Extensive pulmonary embolus bilaterally, more pronounced on the right than on the left, essentially stable compared to study from 6 months prior. Question persistence of pulmonary embolus compared to prior study versus recurrent pulmonary embolus. Both entities may exist concurrently. 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 There is evidence of right heart strain with a right ventricle to left ventricular diameter  ratio of 1.3. Note that this finding was present on previous study and is essentially stable. Positive for acute PE with CT evidence of right heart strain (RV/LV Ratio = 1.3) consistent with at least submassive (intermediate risk) PE. The presence of right heart strain has been associated with an increased risk of morbidity and mortality. Please activate Code PE by paging [phone]. 2. Prominence of the main pulmonary outflow tract, felt to be indicative of pulmonary arterial hypertension. 3. Airspace opacity in the medial segment of the right middle lobe. The appearance is suggestive of pneumonia. A degree of pulmonary infarct in this area is possible. Pneumonia in an area of infarct is also possible. No other airspace consolidations seen. There is patchy lower lobe atelectasis bilaterally. 4.  No evident adenopathy. 5.  Inferior vena cava filter present, incompletely visualized. 6.  There is hepatic steatosis. Critical Value/emergent results were called by telephone at the time of interpretation on 08/03/2017 at 7:25 pm to Dr. Nita Sickle , who verbally acknowledged these results. Electronically Signed   By: Bretta Bang III M.D.   On: 08/03/2017 19:27   US Venous Img Lower Bilateral  Result Date: 08/04/2017 CLINICAL DATA:  Shortness of breath, lower extremity edema and history of prior DVT and pulmonary embolism. EXAM: BILATERAL LOWER EXTREMITY VENOUS DOPPLER ULTRASOUND TECHNIQUE: Gray-scale sonography with graded compression, as well as color Doppler and duplex ultrasound were performed to evaluate the lower extremity deep venous systems from the level of the common femoral vein and including the common femoral, femoral, profunda femoral, popliteal and calf veins including the posterior tibial, peroneal and gastrocnemius veins when visible. The superficial great saphenous vein was also interrogated. Spectral Doppler was utilized to evaluate flow at rest and with distal augmentation maneuvers in  the common femoral, femoral and popliteal veins. COMPARISON:  11/22/2016 FINDINGS: RIGHT LOWER EXTREMITY Common Femoral Vein: No evidence of thrombus. Normal compressibility, respiratory phasicity and response to  augmentation. Saphenofemoral Junction: No evidence of thrombus. Normal compressibility and flow on color Doppler imaging. Profunda Femoral Vein: No evidence of thrombus. Normal compressibility and flow on color Doppler imaging. Femoral Vein: The femoral vein demonstrates a mild amount of echogenic nonocclusive mural thrombus causing luminal narrowing. Findings appear similar to ultrasound findings in March and likely represents chronic mural thrombus. Popliteal Vein: Nonocclusive thrombus causes significant luminal narrowing. Findings are similar to the prior ultrasound and appear to largely represent chronic thrombus. Calf Veins: No evidence of thrombus. Normal compressibility and flow on color Doppler imaging. Superficial Great Saphenous Vein: No evidence of thrombus. Normal compressibility. Venous Reflux:  None. Other Findings: No evidence of superficial thrombophlebitis or abnormal fluid collection. LEFT LOWER EXTREMITY Common Femoral Vein: Nonocclusive chronic mural thrombus identified. Saphenofemoral Junction: No evidence of thrombus. Normal compressibility and flow on color Doppler imaging. Profunda Femoral Vein: Nonocclusive chronic mural thrombus identified. Femoral Vein: Extensive nonocclusive thrombus with patent flow present. Although largely felt to represent chronic thrombus, a component of acute thrombus in the left femoral vein cannot be entirely excluded. Popliteal Vein: Nonocclusive thrombus causes luminal narrowing. Similar to the femoral vein, this may represent chronic thrombus but a component of acute thrombus cannot be entirely excluded. Calf Veins: No evidence of thrombus. Normal compressibility and flow on color Doppler imaging. Superficial Great Saphenous Vein: No evidence of  thrombus. Normal compressibility. Venous Reflux:  None. Other Findings: No evidence of superficial thrombophlebitis or abnormal fluid collection. IMPRESSION: 1. Nonocclusive thrombus in the right femoral and popliteal veins causes luminal stenosis and appears similar to the March study. This appears to largely represent chronic thrombus in the right lower extremity. 2. More significant nonocclusive thrombus in the left lower extremity, especially in the femoral and popliteal segments. Although felt to be likely largely chronic, a component of acute thrombus cannot be entirely excluded in the femoral vein and popliteal vein. In the common femoral and profunda femoral veins, thrombus appears more likely representative of stable chronic thrombus. Electronically Signed   By: Irish LackGlenn  Yamagata M.D.   On: 08/04/2017 13:05   Dg Chest Portable 1 View  Result Date: 08/03/2017 CLINICAL DATA:  Chest pain since 1400 hours today. EXAM: PORTABLE CHEST 1 VIEW COMPARISON:  01/03/2017 FINDINGS: The heart size and mediastinal contours are within normal limits. Low lung volumes are noted. No pneumonic consolidation or CHF. No effusion or pneumothorax. Stable mild right hilar prominence believed related to vascular prominence. The visualized skeletal structures are unremarkable. IMPRESSION: Low lung volumes without active pulmonary disease Electronically Signed   By: Tollie Ethavid  Kwon M.D.   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  1. Acute on chronic respiratory failure with hypoxia-secondary to the pulmonary embolism with infarction. -Continue supportive care with oxygen supplementation, continue treat underlying pulmonary embolism  with heparin drip. Continue pain control with IV morphine for the pleurisy and pulmonary infarct.  2. Chest pain-secondary to the pulmonary embolism with infarction. No evidence of acute coronary syndrome. -continue supportive care with pain control with IV morphine and oxycodone for now.  3. Pulmonary embolism with infarction-this is the cause of patient's shortness of breath and chest pain. -Patient apparently has history of recurrent DVT and PE and needs to be  on lifelong anticoagulation. Patient was on Xarelto but due to his weight he was switched to Lovenox Coumadin. He also was on Eliquis and was not compliant with it. -His INR was subtherapeutic, his CT chest during this admission shows the submassive PE on the right side which has unchanged from from May of this year. Unclear if this is secondary to patient's noncompliance/heaviness subtherapeutic INR. -Appreciate pulmonary input and continue heparin drip for now. Await vascular surgery input to see if he would benefit from thrombolysis/thrombectomy.  -Patient needs to be explained the importance of compliance with anticoagulation, and it would be better to use a oral anticoagulant like Eliquis since of Lovenox and Coumadin.  4. Essential hypertension-continue lisinopril.  All the records are reviewed and case discussed with Care Management/Social Worker. Management plans discussed with the patient, family and they are in agreement.  CODE STATUS: Full code  DVT Prophylaxis: Heparin gtt  TOTAL TIME TAKING CARE OF THIS PATIENT: 30 minutes.   POSSIBLE D/C IN 1-2 DAYS, DEPENDING ON CLINICAL CONDITION.   [place] SirenSAINANI,Lynetta Tomczak J M.D on 08/04/2017 at 2:38 PM  Between 7am to 6pm - Pager - [phone]  After 6pm go to [email or web address] - Social research officer, governmentpassword EPAS [facility]  [facility] [place] [facility]  Office  [phone]  CC: Primary care physician; Sherrie MustacheJadali, Fayegh, MD

## 2017-08-05 ENCOUNTER — Inpatient Hospital Stay: Admit: 2017-08-05 | Payer: 59

## 2017-08-05 LAB — HEPARIN LEVEL (UNFRACTIONATED)
HEPARIN UNFRACTIONATED: 0.5 [IU]/mL (ref 0.30–0.70)
HEPARIN UNFRACTIONATED: 0.44 [IU]/mL (ref 0.30–0.70)

## 2017-08-05 LAB — BASIC METABOLIC PANEL
Anion gap: 9 (ref 5–15)
BUN: 18 mg/dL (ref 6–20)
CALCIUM: 8.5 mg/dL — AB (ref 8.9–10.3)
CHLORIDE: 104 mmol/L (ref 101–111)
CO2: 23 mmol/L (ref 22–32)
CREATININE: 1.34 mg/dL — AB (ref 0.61–1.24)
Glucose, Bld: 107 mg/dL — ABNORMAL HIGH (ref 65–99)
Potassium: 4 mmol/L (ref 3.5–5.1)
SODIUM: 136 mmol/L (ref 135–145)

## 2017-08-05 LAB — CBC
HCT: 41.5 % (ref 40.0–52.0)
HEMOGLOBIN: 13.7 g/dL (ref 13.0–18.0)
MCH: 30.1 pg (ref 26.0–34.0)
MCHC: 33 g/dL (ref 32.0–36.0)
MCV: 91.4 fL (ref 80.0–100.0)
PLATELETS: 254 10*3/uL (ref 150–440)
RBC: 4.54 MIL/uL (ref 4.40–5.90)
RDW: 14.2 % (ref 11.5–14.5)
WBC: 4.7 10*3/uL (ref 3.8–10.6)

## 2017-08-05 MED ORDER — WARFARIN SODIUM 10 MG PO TABS
10.0000 mg | ORAL_TABLET | Freq: Once | ORAL | Status: AC
  Administered 2017-08-05: 10 mg via ORAL
  Filled 2017-08-05: qty 1

## 2017-08-05 MED ORDER — PREMIER PROTEIN SHAKE
11.0000 [oz_av] | Freq: Two times a day (BID) | ORAL | Status: DC
  Administered 2017-08-05 – 2017-08-12 (×9): 11 [oz_av] via ORAL

## 2017-08-05 MED ORDER — WARFARIN - PHARMACIST DOSING INPATIENT: Freq: Every day | Status: DC

## 2017-08-05 MED ORDER — ADULT MULTIVITAMIN W/MINERALS CH
1.0000 | ORAL_TABLET | Freq: Every day | ORAL | Status: DC
  Administered 2017-08-05 – 2017-08-12 (×8): 1 via ORAL
  Filled 2017-08-05 (×8): qty 1

## 2017-08-05 NOTE — Progress Notes (Signed)
ANTICOAGULATION CONSULT NOTE - Initial Consult  Pharmacy Consult for heparin drip and warfarin Indication: pulmonary embolus  Allergies  Allergen Reactions  . Clindamycin/Lincomycin Rash  . Sulfa Antibiotics Rash    Patient Measurements: Height: 5\' 7"  (170.2 cm) Weight: (!) 348 lb 1.6 oz (157.9 kg) IBW/kg (Calculated) : 66.1 Heparin Dosing Weight: 105 kg   Vital Signs: Temp: 98.1 F (36.7 C) (12/07 0746) Temp Source: Oral (12/07 0746) BP: 120/70 (12/07 0746) Pulse Rate: 59 (12/07 0746)  Labs: Recent Labs    08/03/17 1736 08/03/17 1757 08/04/17 0216  08/04/17 2230 08/05/17 0507 08/05/17 1051  HGB 15.5  --  13.9  --   --  13.7  --   HCT 47.5  --  42.2  --   --  41.5  --   PLT 300  --  263  --   --  254  --   APTT  --  27  --   --   --   --   --   LABPROT 15.4*  --   --   --   --   --   --   INR 1.23  --   --   --   --   --   --   HEPARINUNFRC  --   --  0.13*   < > 0.15* 0.50 0.44  CREATININE 1.48*  --  1.53*  --   --  1.34*  --   TROPONINI <0.03  --   --   --   --   --   --    < > = values in this interval not displayed.    Estimated Creatinine Clearance: 99.1 mL/min (A) (by C-G formula based on SCr of 1.34 mg/dL (H)).   Medical History: Past Medical History:  Diagnosis Date  . CHF (congestive heart failure) (HCC)   . Diabetes mellitus without complication (HCC)    per pt-pre diabetic-Dr Dario GuardianJadali stated said pt  . DVT (deep venous thrombosis) (HCC)   . Hypertension   . Peripheral vascular disease (HCC)   . Presence of IVC filter     Assessment: 46 year old male with large PE, presents subtherapeutic on warfarin 9mg  daily (per patient). INR 1.2.   Spoke with patient regarding warfarin on 12/7 and patient reports a recent change in his diet with an increase in the amount of greens and vitamin K containing foods. We discussed importance of a consistent diet, INR monitoring, and advised patient to report changes in diet to clinic for INR monitoring.  Warfarin  to be resumed tonight.   Goal of Therapy:  Heparin level 0.3-0.7 units/ml Monitor platelets by anticoagulation protocol: Yes   Plan:  Confirmatory HL = 0.44 is therapeutic. Continue heparin infusion at current rate of 2200 units/hr. Will recheck HL and CBC with AM labs tomorrow per protocol. Ordered warfarin 10 mg PO this evening. Warfarin counseling completed on 12/7.  Cindi CarbonMary M Loghan Subia, PharmD, BCPS Clinical Pharmacist 08/05/2017

## 2017-08-05 NOTE — Plan of Care (Signed)
  Not Progressing Clinical Measurements: Ability to maintain clinical measurements within normal limits will improve 08/05/2017 [id number] - Not Progressing by Donald ProseBerry, Grettel Rames L, RN Note Heparin level low, increased heparin today Respiratory complications will improve 08/05/2017 0826 - Not Progressing by Donald ProseBerry, Ringo Sherod L, RN Note Remains on 4L per [place] Phase II Progression Outcomes 02 sats trending upward/stable (PE) 08/05/2017 [id number] - Not Progressing by Donald ProseBerry, Mckinzey Entwistle L, RN Note Remains on oxygen   Not Applicable Clinical Measurements: Will remain free from infection 08/05/2017 [id number] - Not Applicable by Donald ProseBerry, Yuchen Fedor L, RN

## 2017-08-05 NOTE — Progress Notes (Signed)
ANTICOAGULATION CONSULT NOTE - Initial Consult  Pharmacy Consult for heparin gtt Indication: pulmonary embolus  Allergies  Allergen Reactions  . Clindamycin/Lincomycin Rash  . Sulfa Antibiotics Rash    Patient Measurements: Height: 5\' 7"  (170.2 cm) Weight: (!) 348 lb 1.6 oz (157.9 kg) IBW/kg (Calculated) : 66.1 Heparin Dosing Weight: 105 kg   Vital Signs: Temp: 97.9 F (36.6 C) (12/07 0436) Temp Source: Oral (12/07 0436) BP: 129/68 (12/07 0436) Pulse Rate: 78 (12/07 0436)  Labs: Recent Labs    08/03/17 1736 08/03/17 1757  08/04/17 0216 08/04/17 1613 08/04/17 2230 08/05/17 0507  HGB 15.5  --   --  13.9  --   --  13.7  HCT 47.5  --   --  42.2  --   --  41.5  PLT 300  --   --  263  --   --  254  APTT  --  27  --   --   --   --   --   LABPROT 15.4*  --   --   --   --   --   --   INR 1.23  --   --   --   --   --   --   HEPARINUNFRC  --   --    < > 0.13* <0.10* 0.15* 0.50  CREATININE 1.48*  --   --  1.53*  --   --  1.34*  TROPONINI <0.03  --   --   --   --   --   --    < > = values in this interval not displayed.    Estimated Creatinine Clearance: 99.1 mL/min (A) (by C-G formula based on SCr of 1.34 mg/dL (H)).   Medical History: Past Medical History:  Diagnosis Date  . CHF (congestive heart failure) (HCC)   . Diabetes mellitus without complication (HCC)    per pt-pre diabetic-Dr Dario GuardianJadali stated said pt  . DVT (deep venous thrombosis) (HCC)   . Hypertension   . Peripheral vascular disease (HCC)   . Presence of IVC filter     Assessment: 46 year old male with large PE, presents subtherapeutic on warfarin 9mg  daily (per patient). INR 1.2. Heparin drip was started in ED - pharmacy consulted to monitor and adjust as needed.   Goal of Therapy:  Heparin level 0.3-0.7 units/ml Monitor platelets by anticoagulation protocol: Yes   Plan:  Heparin gtt was stopped for ~1.5 hours. Heparin gtt restarted @ 1530. Will check Heparin level @ 2130.   12/6 @ 2130 HL < 0.10  subtherapeutic. Will rebolus w/ heparin 3000 units IV x 1 and increase rate to 2200 units/hr and recheck HL/CBC w/ am labs.  12/7 @ 0500 HL 0.50 therapeutic. Will continue current rate and will recheck @ 1100, CBC stable  Thomasene Rippleavid Joseff Luckman, PharmD, BCPS Clinical Pharmacist 08/05/2017

## 2017-08-05 NOTE — Progress Notes (Signed)
CH approached by a friend or family member requesting prayer outside the room. CH provided prayer. CH available for consult upon requests.

## 2017-08-05 NOTE — Progress Notes (Signed)
[employer] [facility] [place] [facility]   PATIENT NAME: Ryan [name]    MR#:  [id number]  DATE OF BIRTH:  09/18/1970  SUBJECTIVE:   Patient here due to shortness of breath and pleuritic chest pain secondary to pulmonary embolism with infarct. Patient has a previous history of PE but has been noncompliant with anticoagulation. Feels much better today and shortness of breath and pleuritic chest pain has improved.  REVIEW OF SYSTEMS:    Review of Systems  Constitutional: Negative for chills and fever.  HENT: Negative for congestion and tinnitus.   Eyes: Negative for blurred vision and double vision.  Respiratory: Positive for shortness of breath. Negative for cough and wheezing.   Cardiovascular: Negative for chest pain, orthopnea and PND.  Gastrointestinal: Negative for abdominal pain, diarrhea, nausea and vomiting.  Genitourinary: Negative for dysuria and hematuria.  Neurological: Negative for dizziness, sensory change and focal weakness.  All other systems reviewed and are negative.   Nutrition: Heart Healthy Tolerating Diet: Yes Tolerating PT: Await Eval.   DRUG ALLERGIES:   Allergies  Allergen Reactions  . Clindamycin/Lincomycin Rash  . Sulfa Antibiotics Rash    VITALS:  Blood pressure 120/70, pulse (!) 59, temperature 98.1 F (36.7 C), temperature source Oral, resp. rate 17, height 5\' 7"  (1.702 m), weight (!) 157.9 kg (348 lb 1.6 oz), SpO2 93 %.  PHYSICAL EXAMINATION:   Physical Exam  GENERAL:  46 y.o.-year-old obese patient lying in bed in mild Resp. Distress.  EYES: Pupils equal, round, reactive to light and accommodation. No scleral icterus. Extraocular muscles intact.  HEENT: Head atraumatic, normocephalic. Oropharynx and nasopharynx clear.  NECK:  Supple, no jugular venous distention. No thyroid enlargement, no tenderness.  LUNGS: Good A/E b/l,  no wheezing, rales, rhonchi. No use of accessory muscles of respiration.  CARDIOVASCULAR: S1, S2 normal.  No murmurs, rubs, or gallops.  ABDOMEN: Soft, nontender, nondistended. Bowel sounds present. No organomegaly or mass.  EXTREMITIES: No cyanosis, clubbing or edema b/l.    NEUROLOGIC: Cranial nerves II through XII are intact. No focal Motor or sensory deficits b/l.  Globally weak.  PSYCHIATRIC: The patient is alert and oriented x 3.  SKIN: No obvious rash, lesion, or ulcer.    LABORATORY PANEL:   CBC Recent Labs  Lab 08/05/17 0507  WBC 4.7  HGB 13.7  HCT 41.5  PLT 254   ------------------------------------------------------------------------------------------------------------------  Chemistries  Recent Labs  Lab 08/05/17 0507  NA 136  K 4.0  CL 104  CO2 23  GLUCOSE 107*  BUN 18  CREATININE 1.34*  CALCIUM 8.5*   ------------------------------------------------------------------------------------------------------------------  Cardiac Enzymes Recent Labs  Lab 08/03/17 1736  TROPONINI <0.03   ------------------------------------------------------------------------------------------------------------------  RADIOLOGY:  Ct Angio Chest Pe W And/or Wo Contrast  Result Date: 08/03/2017 CLINICAL DATA:  Chest pain. Shortness of breath. History of pulmonary embolus EXAM: CT ANGIOGRAPHY CHEST WITH CONTRAST TECHNIQUE: Multidetector CT imaging of the chest was performed using the standard protocol during bolus administration of intravenous contrast. Multiplanar CT image reconstructions and MIPs were obtained to evaluate the vascular anatomy. CONTRAST:  ISOVUE-370 IOPAMIDOL (ISOVUE-370) INJECTION 76% COMPARISON:  CT angiogram chest Jan 03, 2017; chest radiograph August 03, 2017 FINDINGS: Cardiovascular: There is again noted extensive pulmonary embolus on the right extending from the anterior aspect of the right main pulmonary outflow tract with extension into multiple right upper and lower lobe pulmonary arterial branches, essentially stable compared to 6 months prior. On the  right, there are pulmonary emboli in both proximal  upper and lower lobe pulmonary artery branches as well as in several more peripheral right lower extremity arterial branches. The appearance is essentially stable compared to the prior study. The right ventricle to left ventricle diameter ratio is 1.3, consistent with right heart strain. There is no appreciable thoracic aortic aneurysm or dissection. The visualized great vessels appear unremarkable. The main pulmonary outflow tract measures 3.4 cm, abnormally large and felt to be indicative of pulmonary arterial hypertension. Pericardium is not appreciably thickened. Mediastinum/Nodes: Visualized thyroid appears normal. No adenopathy by size criteria is evident. A few subcentimeter mediastinal lymph nodes are present. No esophageal lesions are evident. Lungs/Pleura: There is airspace consolidation in a portion of the medial segment of the right middle lobe. There is atelectatic change in both lower lobes. The lungs elsewhere are clear. No pleural effusions are evident. Upper Abdomen: In the visualized upper abdomen, there is incomplete visualization of a filter within the inferior vena cava. There is hepatic steatosis. Visualized upper abdominal structures otherwise appear unremarkable. Musculoskeletal: There is degenerative change in the lower thoracic spine. No blastic or lytic bone lesions. Review of the MIP images confirms the above findings. IMPRESSION: 1. Extensive pulmonary embolus bilaterally, more pronounced on the right than on the left, essentially stable compared to study from 6 months prior. Question persistence of pulmonary embolus compared to prior study versus recurrent pulmonary embolus. Both entities may exist concurrently. 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 There is evidence of right heart strain with a right ventricle to left ventricular  diameter ratio of 1.3. Note that this finding was present on previous study and is essentially stable. Positive for acute PE with CT evidence of right heart strain (RV/LV Ratio = 1.3) consistent with at least submassive (intermediate risk) PE. The presence of right heart strain has been associated with an increased risk of morbidity and mortality. Please activate Code PE by paging [phone]. 2. Prominence of the main pulmonary outflow tract, felt to be indicative of pulmonary arterial hypertension. 3. Airspace opacity in the medial segment of the right middle lobe. The appearance is suggestive of pneumonia. A degree of pulmonary infarct in this area is possible. Pneumonia in an area of infarct is also possible. No other airspace consolidations seen. There is patchy lower lobe atelectasis bilaterally. 4.  No evident adenopathy. 5.  Inferior vena cava filter present, incompletely visualized. 6.  There is hepatic steatosis. Critical Value/emergent results were called by telephone at the time of interpretation on 08/03/2017 at 7:25 pm to Dr. Nita SickleAROLINA VERONESE , who verbally acknowledged these results. Electronically Signed   By: Bretta BangWilliam  Woodruff III M.D.   On: 08/03/2017 19:27   [place] Venous Img Lower Bilateral  Result Date: 08/04/2017 CLINICAL DATA:  Shortness of breath, lower extremity edema and history of prior DVT and pulmonary embolism. EXAM: BILATERAL LOWER EXTREMITY VENOUS DOPPLER ULTRASOUND TECHNIQUE: Gray-scale sonography with graded compression, as well as color Doppler and duplex ultrasound were performed to evaluate the lower extremity deep venous systems from the level of the common femoral vein and including the common femoral, femoral, profunda femoral, popliteal and calf veins including the posterior tibial, peroneal and gastrocnemius veins when visible. The superficial great saphenous vein was also interrogated. Spectral Doppler was utilized to evaluate flow at rest and with distal augmentation  maneuvers in the common femoral, femoral and popliteal veins. COMPARISON:  11/22/2016 FINDINGS: RIGHT LOWER EXTREMITY Common Femoral Vein: No evidence of thrombus. Normal  compressibility, respiratory phasicity and response to augmentation. Saphenofemoral Junction: No evidence of thrombus. Normal compressibility and flow on color Doppler imaging. Profunda Femoral Vein: No evidence of thrombus. Normal compressibility and flow on color Doppler imaging. Femoral Vein: The femoral vein demonstrates a mild amount of echogenic nonocclusive mural thrombus causing luminal narrowing. Findings appear similar to ultrasound findings in March and likely represents chronic mural thrombus. Popliteal Vein: Nonocclusive thrombus causes significant luminal narrowing. Findings are similar to the prior ultrasound and appear to largely represent chronic thrombus. Calf Veins: No evidence of thrombus. Normal compressibility and flow on color Doppler imaging. Superficial Great Saphenous Vein: No evidence of thrombus. Normal compressibility. Venous Reflux:  None. Other Findings: No evidence of superficial thrombophlebitis or abnormal fluid collection. LEFT LOWER EXTREMITY Common Femoral Vein: Nonocclusive chronic mural thrombus identified. Saphenofemoral Junction: No evidence of thrombus. Normal compressibility and flow on color Doppler imaging. Profunda Femoral Vein: Nonocclusive chronic mural thrombus identified. Femoral Vein: Extensive nonocclusive thrombus with patent flow present. Although largely felt to represent chronic thrombus, a component of acute thrombus in the left femoral vein cannot be entirely excluded. Popliteal Vein: Nonocclusive thrombus causes luminal narrowing. Similar to the femoral vein, this may represent chronic thrombus but a component of acute thrombus cannot be entirely excluded. Calf Veins: No evidence of thrombus. Normal compressibility and flow on color Doppler imaging. Superficial Great Saphenous Vein: No  evidence of thrombus. Normal compressibility. Venous Reflux:  None. Other Findings: No evidence of superficial thrombophlebitis or abnormal fluid collection. IMPRESSION: 1. Nonocclusive thrombus in the right femoral and popliteal veins causes luminal stenosis and appears similar to the March study. This appears to largely represent chronic thrombus in the right lower extremity. 2. More significant nonocclusive thrombus in the left lower extremity, especially in the femoral and popliteal segments. Although felt to be likely largely chronic, a component of acute thrombus cannot be entirely excluded in the femoral vein and popliteal vein. In the common femoral and profunda femoral veins, thrombus appears more likely representative of stable chronic thrombus. Electronically Signed   By: Irish Lack M.D.   On: 08/04/2017 13:05   Dg Chest Portable 1 View  Result Date: 08/03/2017 CLINICAL DATA:  Chest pain since 1400 hours today. EXAM: PORTABLE CHEST 1 VIEW COMPARISON:  01/03/2017 FINDINGS: The heart size and mediastinal contours are within normal limits. Low lung volumes are noted. No pneumonic consolidation or CHF. No effusion or pneumothorax. Stable mild right hilar prominence believed related to vascular prominence. The visualized skeletal structures are unremarkable. IMPRESSION: Low lung volumes without active pulmonary disease Electronically Signed   By: Tollie Eth M.D.   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  1. Acute on chronic respiratory failure with hypoxia-secondary to the pulmonary embolism with infarction. -Continue supportive care with oxygen supplementation, continue treat underlying  pulmonary embolism with heparin drip and started on coumadin today.  - Continue pain control with IV morphine for the pleurisy and pulmonary infarct.  2. Chest pain-secondary to the pulmonary embolism with infarction. No evidence of acute coronary syndrome.  -continue supportive care with pain control with IV morphine and oxycodone for now. Much improved.   3. Pulmonary embolism with infarction-this is the cause of patient's shortness of  breath and chest pain. -Patient apparently has history of recurrent DVT and PE and needs to be on lifelong anticoagulation. Patient was on Xarelto but due to his weight he was switched to Lovenox, Coumadin. He also was on Eliquis and was not compliant with it. -His INR was subtherapeutic, his CT chest during this admission shows the submassive PE on the right side which has unchanged from from May of this year. Unclear if this is secondary to patient's noncompliance subtherapeutic INR. -Appreciate pulmonary input and continue heparin drip for now. Seen by vascular surgery and patient would not benefit from thrombectomy or thrombolysis as the patient patient's clot is significantly old. -Patient is well known to Dr. Smith Robertao (hem/onc) from previous admission and discussed with her over the phone and she recommends patient continue Lovenox/Coumadin and have closer follow-up of his INR as an outpatient. He is not a good candidate for Eliquis/Xarelto given his severe obesity. -Continue Coumadin, heparin drip and try to get INR closer to range and possibly discharge in the next few days when his INR is close to being therapeutic.  4. Essential hypertension-continue lisinopril.  All the records are reviewed and case discussed with Care Management/Social Worker. Management plans discussed with the patient, family and they are in agreement.  CODE STATUS: Full code  DVT Prophylaxis: Heparin gtt  TOTAL TIME TAKING CARE OF THIS PATIENT: 35 minutes.   POSSIBLE D/C IN 2-3  DAYS, DEPENDING ON CLINICAL CONDITION.  Greater than 50% of time spent in coordinating care and discussing plan with patient, hematology oncology, pharmacy.  Houston SirenSAINANI,VIVEK J M.D on 08/05/2017 at 3:39 PM  Between 7am to 6pm - Pager - [phone]  After 6pm go to [email or web address] - Social research officer, governmentpassword EPAS [email or web address]  [facility] [place]  Office  [phone]  CC: Primary care physician; Sherrie MustacheJadali, Fayegh, MD

## 2017-08-05 NOTE — Progress Notes (Signed)
Nutrition Education Note  RD consulted for nutrition education regarding CHF and nontherapeutic INR.  RD provided "Low Sodium Nutrition Therapy" handout from the Academy of [employer]. Reviewed patient's dietary recall. Provided examples on ways to decrease sodium intake in diet. Discouraged intake of processed foods and use of salt shaker. Encouraged fresh fruits and vegetables as well as whole grain sources of carbohydrates to maximize fiber intake.   RD discussed why it is important for patient to adhere to diet recommendations, and emphasized the role of fluids, foods to avoid, and importance of weighing self daily. Teach back method used.  Pt follows a keto-diet at home. Has lost 15lbs intentionally. Discussed with pt how a keto-diet high in protein can also mean a diet high in saturated fats. Recommend pt incorporate healthy carbohydrates (whole grains) in his diet and avoid "junk foods" that don't provide him any nutrition. Pt eating mainly meats and greens. Discussed with pt the significance of vitamin K, vegetables, and their effects on INR. Advised pt not to eliminate these foods from his diet as they provide many important vitamins and minerals needed for good health as well as a good source of fiber. Recommended pt eat a steady amount of vitamin K every day so that his coumadin can be regulated around his diet. Explained how its very difficult to regulate his medications when he may be eating excessive vitamin K one day and then none for the next few days. Provided a handout with a list of vitamin K containing foods and the amounts provided. Pt can use this list to make sure he eats steady amounts daily.   Expect fair compliance; pt will likely continue keto-diet.   Body mass index is 54.52 kg/m. Pt meets criteria for morbid obesity based on current BMI.  Current diet order is heart healthy, patient is consuming approximately 100% of meals at this time. Labs and medications  reviewed.   RD will liberalize diet and order Premier Protein BID, each supplement provides 160 kcal and 30 grams of protein.   No further nutrition interventions warranted at this time. RD contact information provided. If additional nutrition issues arise, please re-consult RD.   Betsey Holidayasey Shandria Clinch MS, RD, LDN Pager #- [phone] After Hours Pager: [phone]\

## 2017-08-05 NOTE — Care Management (Addendum)
Placed diet consult for diet restrictions in the event patient discharges with resumption of his coumadin. Did not have the pulmonary thrombolysis.  There was some discussion regarding open surgical embolectomy which would have to be performed at a tertiary facility.

## 2017-08-06 ENCOUNTER — Inpatient Hospital Stay
Admit: 2017-08-06 | Discharge: 2017-08-06 | Disposition: A | Discharge status: Home / Self Care | Payer: 59 | Attending: Internal Medicine | Admitting: Internal Medicine

## 2017-08-06 LAB — ECHOCARDIOGRAM COMPLETE
Area-P 1/2: 4.58 cm2
E decel time: 165 msec
E/e' ratio: 9.2
FS: 36 % (ref 28–44)
Height: 67 in
IV/PV OW: 0.89
LA vol: 71.2 mL
LADIAMINDEX: 1.63 cm/m2
LASIZE: 46 mm
LAVOLA4C: 55.8 mL
LAVOLIN: 25.2 mL/m2
LEFT ATRIUM END SYS DIAM: 46 mm
LV E/e'average: 9.2
LV TDI E'MEDIAL: 8.38
LVEEMED: 9.2
LVELAT: 11.2 cm/s
MV Dec: 165
MV pk E vel: 103 m/s
MVPG: 4 mmHg
MVPKAVEL: 88.3 m/s
MVSPHT: 48 ms
PW: 10.7 mm — AB (ref 0.6–1.1)
TAPSE: 28.7 mm
TDI e' lateral: 11.2
Weight: 5569.6 oz

## 2017-08-06 LAB — CBC
HCT: 42.9 % (ref 40.0–52.0)
Hemoglobin: 14.3 g/dL (ref 13.0–18.0)
MCH: 30.6 pg (ref 26.0–34.0)
MCHC: 33.2 g/dL (ref 32.0–36.0)
MCV: 92 fL (ref 80.0–100.0)
Platelets: 302 10*3/uL (ref 150–440)
RBC: 4.66 MIL/uL (ref 4.40–5.90)
RDW: 14.2 % (ref 11.5–14.5)
WBC: 4.1 10*3/uL (ref 3.8–10.6)

## 2017-08-06 LAB — HEPARIN LEVEL (UNFRACTIONATED): HEPARIN UNFRACTIONATED: 0.41 [IU]/mL (ref 0.30–0.70)

## 2017-08-06 LAB — PROTIME-INR
INR: 1.14
PROTHROMBIN TIME: 14.5 s (ref 11.4–15.2)

## 2017-08-06 MED ORDER — WARFARIN SODIUM 10 MG PO TABS
10.0000 mg | ORAL_TABLET | Freq: Once | ORAL | Status: AC
  Administered 2017-08-06: 10 mg via ORAL
  Filled 2017-08-06: qty 1

## 2017-08-06 NOTE — Progress Notes (Signed)
ANTICOAGULATION CONSULT NOTE - follow up  Pharmacy Consult for heparin drip and warfarin Indication: pulmonary embolus  Allergies  Allergen Reactions  . Clindamycin/Lincomycin Rash  . Sulfa Antibiotics Rash    Patient Measurements: Height: 5\' 7"  (170.2 cm) Weight: (!) 348 lb 1.6 oz (157.9 kg) IBW/kg (Calculated) : 66.1 Heparin Dosing Weight: 105 kg   Vital Signs: Temp: 98.1 F (36.7 C) (12/08 0827) Temp Source: Oral (12/08 0827) BP: 116/76 (12/08 0827) Pulse Rate: 84 (12/08 0827)  Labs: Recent Labs    08/03/17 1736 08/03/17 1757 08/04/17 0216  08/05/17 0507 08/05/17 1051 08/06/17 0546  HGB 15.5  --  13.9  --  13.7  --  14.3  HCT 47.5  --  42.2  --  41.5  --  42.9  PLT 300  --  263  --  254  --  302  APTT  --  27  --   --   --   --   --   LABPROT 15.4*  --   --   --   --   --  14.5  INR 1.23  --   --   --   --   --  1.14  HEPARINUNFRC  --   --  0.13*   < > 0.50 0.44 0.41  CREATININE 1.48*  --  1.53*  --  1.34*  --   --   TROPONINI <0.03  --   --   --   --   --   --    < > = values in this interval not displayed.    Estimated Creatinine Clearance: 99.1 mL/min (A) (by C-G formula based on SCr of 1.34 mg/dL (H)).   Medical History: Past Medical History:  Diagnosis Date  . CHF (congestive heart failure) (HCC)   . Diabetes mellitus without complication (HCC)    per pt-pre diabetic-Dr Dario GuardianJadali stated said pt  . DVT (deep venous thrombosis) (HCC)   . Hypertension   . Peripheral vascular disease (HCC)   . Presence of IVC filter     Assessment: 46 year old male with large PE, presents subtherapeutic on warfarin 9mg  daily (per patient). INR 1.2.   Spoke with patient regarding warfarin on 12/7 and patient reports a recent change in his diet with an increase in the amount of greens and vitamin K containing foods. We discussed importance of a consistent diet, INR monitoring, and advised patient to report changes in diet to clinic for INR monitoring.  Warfarin to be  resumed tonight.   Goal of Therapy:  Heparin level 0.3-0.7 units/ml Monitor platelets by anticoagulation protocol: Yes   Plan:  Confirmatory HL = 0.44 is therapeutic. Continue heparin infusion at current rate of 2200 units/hr. Will recheck HL and CBC with AM labs tomorrow per protocol. Ordered warfarin 10 mg PO this evening. Warfarin counseling completed on 12/7.  12/8 @ 0500 HL 0.41 therapeutic. Will continue current rate and will recheck w/ am labs. Will order Warfarin 10mg  again for tonight and reheck INR with am labs.   Stormy CardKatsoudas,Chibuikem Thang K, Catholic Medical CenterRPH Clinical Pharmacist 08/06/2017, 10:42 AM

## 2017-08-06 NOTE — Progress Notes (Signed)
Patient ID: Ryan [name], male   DOB: Oct 08, 1970, 46 y.o.   MRN: [id number]  [facility] PROGRESS NOTE  Ryan [name] DOB: Oct 08, 1970 DOA: 08/03/2017 PCP: Sherrie MustacheJadali, Fayegh, MD  HPI/Subjective: Patient feeling better.  No complaints of chest pain or shortness of breath that he had when he came in.  He stated that he is trying to lose weight and be healthier and he ate a lot of spinach recently.  When he came in his INR was 1.2.  Objective: Vitals:   08/06/17 0827 08/06/17 1159  BP: 116/76   Pulse: 84   Resp: 18   Temp: 98.1 F (36.7 C)   SpO2: 99% 95%    Filed Weights   08/04/17 1348 08/05/17 0436 08/06/17 0509  Weight: (!) 156.9 kg (346 lb) (!) 157.9 kg (348 lb 1.6 oz) (!) 157.9 kg (348 lb 1.6 oz)    ROS: Review of Systems  Constitutional: Negative for chills and fever.  Eyes: Negative for blurred vision.  Respiratory: Negative for cough and shortness of breath.   Cardiovascular: Negative for chest pain.  Gastrointestinal: Negative for abdominal pain, constipation, diarrhea, nausea and vomiting.  Genitourinary: Negative for dysuria.  Musculoskeletal: Negative for joint pain.  Neurological: Negative for dizziness and headaches.   Exam: Physical Exam  Constitutional: He is oriented to person, place, and time.  HENT:  Nose: No mucosal edema.  Mouth/Throat: No oropharyngeal exudate or posterior oropharyngeal edema.  Eyes: Conjunctivae, EOM and lids are normal. Pupils are equal, round, and reactive to light.  Neck: No JVD present. Carotid bruit is not present. No edema present. No thyroid mass and no thyromegaly present.  Cardiovascular: S1 normal and S2 normal. Exam reveals no gallop.  No murmur heard. Pulses:      Dorsalis pedis pulses are 1+ on the right side, and 1+ on the left side.  Respiratory: No respiratory distress. He has decreased breath sounds in the right lower field and the left lower field. He has no wheezes. He has no rhonchi. He has no  rales.  GI: Soft. Bowel sounds are normal. There is no tenderness.  Musculoskeletal:       Right ankle: He exhibits swelling.       Left ankle: He exhibits swelling.  Lymphadenopathy:    He has no cervical adenopathy.  Neurological: He is alert and oriented to person, place, and time. No cranial nerve deficit.  Skin: Skin is warm. No rash noted. Nails show no clubbing.  Psychiatric: He has a normal mood and affect.      Data Reviewed: Basic Metabolic Panel: Recent Labs  Lab 08/03/17 1736 08/04/17 0216 08/05/17 0507  NA 135 135 136  K 4.5 3.8 4.0  CL 100* 103 104  CO2 25 26 23   GLUCOSE 102* 96 107*  BUN 17 17 18   CREATININE 1.48* 1.53* 1.34*  CALCIUM 9.2 8.5* 8.5*   CBC: Recent Labs  Lab 08/03/17 1736 08/04/17 0216 08/05/17 0507 08/06/17 0546  WBC 6.6 7.2 4.7 4.1  NEUTROABS 4.2  --   --   --   HGB 15.5 13.9 13.7 14.3  HCT 47.5 42.2 41.5 42.9  MCV 91.1 91.2 91.4 92.0  PLT 300 263 254 302   Cardiac Enzymes: Recent Labs  Lab 08/03/17 1736  TROPONINI <0.03   BNP (last 3 results) Recent Labs    11/22/16 0937 01/03/17 1119 08/03/17 1736  BNP 24.0 20.0 14.0    CBG: Recent Labs  Lab 08/04/17 1407  GLUCAP 109*  Scheduled Meds: . Influenza vac split quadrivalent PF  0.5 mL Intramuscular Tomorrow-1000  . lisinopril  10 mg Oral Daily  . multivitamin with minerals  1 tablet Oral Daily  . pneumococcal 23 valent vaccine  0.5 mL Intramuscular Tomorrow-1000  . protein supplement shake  11 oz Oral BID BM  . warfarin  10 mg Oral ONCE-1800  . Warfarin - Pharmacist Dosing Inpatient   Does not apply q1800   Continuous Infusions: . heparin 2,200 Units/hr (08/06/17 1157)    Assessment/Plan:  1. 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  Pulmonary infarct.  Patient came in with subtherapeutic INR.  Patient's been eating a lot of spinach recently.  Case discussed with pharmacy and Coumadin was restarted last night.  Will need to be therapeutic prior to disposition.   Continue heparin drip at this time. 2. Essential hypertension on lisinopril 3. Morbid obesity weight loss needed   Code Status:     Code Status Orders  (From admission, onward)        Start     Ordered   08/03/17 2234  Full code  Continuous     08/03/17 2233    Code Status History    Date Active Date Inactive Code Status Order ID Comments User Context   01/03/2017 18:39 01/04/2017 21:56 Full Code [id number]  Houston SirenSainani, Vivek J, MD Inpatient   11/22/2016 16:51 11/24/2016 21:25 Full Code [id number]  [name], Qing, MD Inpatient   12/28/2014 23:45 01/15/2015 22:11 Full Code [id number]  Gasper SellsVickers, Pamela, RN Inpatient     Family Communication: Patient's father at the bedside Disposition Plan: Home once INR therapeutic  Time spent: 28 minutes  T[place] Talcott [employer]  [employer] [facility]

## 2017-08-06 NOTE — Progress Notes (Signed)
ANTICOAGULATION CONSULT NOTE - Initial Consult  Pharmacy Consult for heparin drip and warfarin Indication: pulmonary embolus  Allergies  Allergen Reactions  . Clindamycin/Lincomycin Rash  . Sulfa Antibiotics Rash    Patient Measurements: Height: 5\' 7"  (170.2 cm) Weight: (!) 348 lb 1.6 oz (157.9 kg) IBW/kg (Calculated) : 66.1 Heparin Dosing Weight: 105 kg   Vital Signs: Temp: 98.4 F (36.9 C) (12/08 0509) Temp Source: Oral (12/08 0509) BP: 112/71 (12/08 0509) Pulse Rate: 87 (12/08 0509)  Labs: Recent Labs    08/03/17 1736 08/03/17 1757 08/04/17 0216  08/05/17 0507 08/05/17 1051 08/06/17 0546  HGB 15.5  --  13.9  --  13.7  --  14.3  HCT 47.5  --  42.2  --  41.5  --  42.9  PLT 300  --  263  --  254  --  302  APTT  --  27  --   --   --   --   --   LABPROT 15.4*  --   --   --   --   --  14.5  INR 1.23  --   --   --   --   --  1.14  HEPARINUNFRC  --   --  0.13*   < > 0.50 0.44 0.41  CREATININE 1.48*  --  1.53*  --  1.34*  --   --   TROPONINI <0.03  --   --   --   --   --   --    < > = values in this interval not displayed.    Estimated Creatinine Clearance: 99.1 mL/min (A) (by C-G formula based on SCr of 1.34 mg/dL (H)).   Medical History: Past Medical History:  Diagnosis Date  . CHF (congestive heart failure) (HCC)   . Diabetes mellitus without complication (HCC)    per pt-pre diabetic-Dr Dario GuardianJadali stated said pt  . DVT (deep venous thrombosis) (HCC)   . Hypertension   . Peripheral vascular disease (HCC)   . Presence of IVC filter     Assessment: 46 year old male with large PE, presents subtherapeutic on warfarin 9mg  daily (per patient). INR 1.2.   Spoke with patient regarding warfarin on 12/7 and patient reports a recent change in his diet with an increase in the amount of greens and vitamin K containing foods. We discussed importance of a consistent diet, INR monitoring, and advised patient to report changes in diet to clinic for INR monitoring.  Warfarin to  be resumed tonight.   Goal of Therapy:  Heparin level 0.3-0.7 units/ml Monitor platelets by anticoagulation protocol: Yes   Plan:  Confirmatory HL = 0.44 is therapeutic. Continue heparin infusion at current rate of 2200 units/hr. Will recheck HL and CBC with AM labs tomorrow per protocol. Ordered warfarin 10 mg PO this evening. Warfarin counseling completed on 12/7.  12/8 @ 0500 HL 0.41 therapeutic. Will continue current rate and will recheck w/ am labs.  Thomasene Rippleavid  Akshita Italiano, PharmD, BCPS Clinical Pharmacist 08/06/2017

## 2017-08-06 NOTE — Plan of Care (Signed)
  Progressing Education: Knowledge of General Education information will improve 08/06/2017 1425 - Progressing by Erma HeritageAlejo Calderon, Charrisse Masley, RN Health Behavior/Discharge Planning: Ability to manage health-related needs will improve 08/06/2017 1425 - Progressing by Weldon PickingAlejo Calderon, Manuella GhaziBerenice, RN Clinical Measurements: Ability to maintain clinical measurements within normal limits will improve 08/06/2017 1425 - Progressing by Erma HeritageAlejo Calderon, Cartrell Bentsen, RN Diagnostic test results will improve 08/06/2017 1425 - Progressing by Erma HeritageAlejo Calderon, Garreth Burnsworth, RN Respiratory complications will improve 08/06/2017 1425 - Progressing by Weldon PickingAlejo Calderon, Manuella GhaziBerenice, RN Note Pt able to be weaned off of oxygen. Went from 4L to 2l. O2 saturation 95% on 2l.   Cardiovascular complication will be avoided 08/06/2017 1425 - Progressing by Erma HeritageAlejo Calderon, Pyper Olexa, RN Pain Managment: General experience of comfort will improve 08/06/2017 1425 - Progressing by Weldon PickingAlejo Calderon, Manuella GhaziBerenice, RN Phase I Progression Outcomes Pain controlled with appropriate interventions 08/06/2017 1425 - Progressing by Weldon PickingAlejo Calderon, Manuella GhaziBerenice, RN Dyspnea controlled at rest (PE) 08/06/2017 1425 - Progressing by Erma HeritageAlejo Calderon, Spike Desilets, RN Tolerating diet 08/06/2017 1425 - Progressing by Weldon PickingAlejo Calderon, Manuella GhaziBerenice, RN Phase II Progression Outcomes 02 sats trending upward/stable (PE) 08/06/2017 1425 - Progressing by Weldon PickingAlejo Calderon, Manuella GhaziBerenice, RN Phase III Progression Outcomes 02 sats stabilized 08/06/2017 1425 - Progressing by Weldon PickingAlejo Calderon, Manuella GhaziBerenice, RN Activity at appropriate level-compared to baseline Description (UP IN CHAIR FOR HEMODIALYSIS) 08/06/2017 1425 - Progressing by Erma HeritageAlejo Calderon, Yvonda Fouty, RN Discharge plan remains appropriate-arrangements made 08/06/2017 1425 - Progressing by Erma HeritageAlejo Calderon, Calvina Liptak, RN

## 2017-08-06 NOTE — Plan of Care (Signed)
Patient continues to require 4liters of oxygen, continue deep breathing exercise and began weaning to 2 liters.

## 2017-08-07 LAB — PROTIME-INR
INR: 1.15
PROTHROMBIN TIME: 14.6 s (ref 11.4–15.2)

## 2017-08-07 LAB — HEPARIN LEVEL (UNFRACTIONATED): HEPARIN UNFRACTIONATED: 0.4 [IU]/mL (ref 0.30–0.70)

## 2017-08-07 MED ORDER — WARFARIN SODIUM 6 MG PO TABS
12.0000 mg | ORAL_TABLET | Freq: Once | ORAL | Status: AC
  Administered 2017-08-07: 12 mg via ORAL
  Filled 2017-08-07: qty 2

## 2017-08-07 NOTE — Progress Notes (Signed)
Patient ID: Ryan [name], male   DOB: 08-27-1971, 46 y.o.   MRN: [id number]  [facility] PROGRESS NOTE  Ryan [name] [id number] DOB: 08-27-1971 DOA: 08/03/2017 PCP: Sherrie MustacheJadali, Fayegh, MD  HPI/Subjective: Patient feeling better.  No complaints of chest pain or shortness of breath.  Trying to loose weight. Was eating spinach at home.  Objective: Vitals:   08/07/17 0401 08/07/17 0800  BP: 124/85 130/80  Pulse: 79 77  Resp: 17 16  Temp: 98.9 F (37.2 C) 98 F (36.7 C)  SpO2: 92% 94%    Filed Weights   08/05/17 0436 08/06/17 0509 08/07/17 0424  Weight: (!) 157.9 kg (348 lb 1.6 oz) (!) 157.9 kg (348 lb 1.6 oz) (!) 156.5 kg (345 lb 1.6 oz)    ROS: Review of Systems  Constitutional: Negative for chills and fever.  Eyes: Negative for blurred vision.  Respiratory: Negative for cough and shortness of breath.   Cardiovascular: Negative for chest pain.  Gastrointestinal: Negative for abdominal pain, constipation, diarrhea, nausea and vomiting.  Genitourinary: Negative for dysuria.  Musculoskeletal: Negative for joint pain.  Neurological: Negative for dizziness and headaches.   Exam: Physical Exam  Constitutional: He is oriented to person, place, and time.  HENT:  Nose: No mucosal edema.  Mouth/Throat: No oropharyngeal exudate or posterior oropharyngeal edema.  Eyes: Conjunctivae, EOM and lids are normal. Pupils are equal, round, and reactive to light.  Neck: No JVD present. Carotid bruit is not present. No edema present. No thyroid mass and no thyromegaly present.  Cardiovascular: S1 normal and S2 normal. Exam reveals no gallop.  No murmur heard. Pulses:      Dorsalis pedis pulses are 1+ on the right side, and 1+ on the left side.  Respiratory: No respiratory distress. He has decreased breath sounds in the right lower field and the left lower field. He has no wheezes. He has no rhonchi. He has no rales.  GI: Soft. Bowel sounds are normal. There is no tenderness.   Musculoskeletal:       Right ankle: He exhibits swelling.       Left ankle: He exhibits swelling.  Lymphadenopathy:    He has no cervical adenopathy.  Neurological: He is alert and oriented to person, place, and time. No cranial nerve deficit.  Skin: Skin is warm. No rash noted. Nails show no clubbing.  Psychiatric: He has a normal mood and affect.      Data Reviewed: Basic Metabolic Panel: Recent Labs  Lab 08/03/17 1736 08/04/17 0216 08/05/17 0507  NA 135 135 136  K 4.5 3.8 4.0  CL 100* 103 104  CO2 25 26 23   GLUCOSE 102* 96 107*  BUN 17 17 18   CREATININE 1.48* 1.53* 1.34*  CALCIUM 9.2 8.5* 8.5*   CBC: Recent Labs  Lab 08/03/17 1736 08/04/17 0216 08/05/17 0507 08/06/17 0546  WBC 6.6 7.2 4.7 4.1  NEUTROABS 4.2  --   --   --   HGB 15.5 13.9 13.7 14.3  HCT 47.5 42.2 41.5 42.9  MCV 91.1 91.2 91.4 92.0  PLT 300 263 254 302   Cardiac Enzymes: Recent Labs  Lab 08/03/17 1736  TROPONINI <0.03   BNP (last 3 results) Recent Labs    11/22/16 0937 01/03/17 1119 08/03/17 1736  BNP 24.0 20.0 14.0    CBG: Recent Labs  Lab 08/04/17 1407  GLUCAP 109*     Scheduled Meds: . Influenza vac split quadrivalent PF  0.5 mL Intramuscular Tomorrow-1000  . lisinopril  10  mg Oral Daily  . multivitamin with minerals  1 tablet Oral Daily  . pneumococcal 23 valent vaccine  0.5 mL Intramuscular Tomorrow-1000  . protein supplement shake  11 oz Oral BID BM  . Warfarin - Pharmacist Dosing Inpatient   Does not apply q1800   Continuous Infusions: . heparin 2,200 Units/hr (08/07/17 1037)    Assessment/Plan:  1. 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  Pulmonary infarct.  Patient came in with subtherapeutic INR.  Patient's been eating a lot of spinach recently.  Case discussed with pharmacy and Coumadin dose will be increased tonight and recheck INR tomorrow..  Will need to be therapeutic prior to disposition.  Continue heparin drip at this time. 2. Essential hypertension on  lisinopril 3. Morbid obesity weight loss needed   Code Status:     Code Status Orders  (From admission, onward)        Start     Ordered   08/03/17 2234  Full code  Continuous     08/03/17 2233    Code Status History    Date Active Date Inactive Code Status Order ID Comments User Context   01/03/2017 18:39 01/04/2017 21:56 Full Code [id number]  Houston SirenSainani, Vivek J, MD Inpatient   11/22/2016 16:51 11/24/2016 21:25 Full Code [id number]  [name], Qing, MD Inpatient   12/28/2014 23:45 01/15/2015 22:11 Full Code [id number]  Gasper SellsVickers, Pamela, RN Inpatient     Family Communication: Spoke with patient's father at the bedside yesterday Disposition Plan: Home once INR therapeutic  Time spent: 25 minutes  Milan Perkins [employer]  [employer] [facility]

## 2017-08-07 NOTE — Progress Notes (Signed)
ANTICOAGULATION CONSULT NOTE - follow up  Pharmacy Consult for heparin drip and warfarin Indication: pulmonary embolus  Allergies  Allergen Reactions  . Clindamycin/Lincomycin Rash  . Sulfa Antibiotics Rash    Patient Measurements: Height: 5\' 7"  (170.2 cm) Weight: (!) 345 lb 1.6 oz (156.5 kg) IBW/kg (Calculated) : 66.1 Heparin Dosing Weight: 105 kg   Vital Signs: Temp: 98 F (36.7 C) (12/09 0800) Temp Source: Oral (12/09 0800) BP: 130/80 (12/09 0800) Pulse Rate: 77 (12/09 0800)  Labs: Recent Labs    08/05/17 0507 08/05/17 1051 08/06/17 0546 08/07/17 0510  HGB 13.7  --  14.3  --   HCT 41.5  --  42.9  --   PLT 254  --  302  --   LABPROT  --   --  14.5 14.6  INR  --   --  1.14 1.15  HEPARINUNFRC 0.50 0.44 0.41 0.40  CREATININE 1.34*  --   --   --     Estimated Creatinine Clearance: 98.6 mL/min (A) (by C-G formula based on SCr of 1.34 mg/dL (H)).   Medical History: Past Medical History:  Diagnosis Date  . CHF (congestive heart failure) (HCC)   . Diabetes mellitus without complication (HCC)    per pt-pre diabetic-Dr Dario GuardianJadali stated said pt  . DVT (deep venous thrombosis) (HCC)   . Hypertension   . Peripheral vascular disease (HCC)   . Presence of IVC filter     Assessment: 46 year old male with large PE, presents subtherapeutic on warfarin 9mg  daily (per patient). INR 1.2.   Spoke with patient regarding warfarin on 12/7 and patient reports a recent change in his diet with an increase in the amount of greens and vitamin K containing foods. We discussed importance of a consistent diet, INR monitoring, and advised patient to report changes in diet to clinic for INR monitoring.  Warfarin to be resumed tonight.   Goal of Therapy:  Heparin level 0.3-0.7 units/ml Monitor platelets by anticoagulation protocol: Yes   Plan:  12/7 Confirmatory HL = 0.44 is therapeutic. Continue heparin infusion at current rate of 2200 units/hr.  Ordered warfarin 10 mg PO this  evening. Warfarin counseling completed on 12/7.  12/8 @ 0500 HL 0.41 therapeutic. Will continue current rate and will recheck w/ am labs. 12/8 @ 0546 INR = 1.14. Will order Warfarin 10mg  again for tonight and reheck INR with am labs.   12/9 @ 0500 HL 0.40 therapeutic. Will continue current rate and will recheck w/ am labs. CBC stable. 12/9 @ 0510 INR = 1.15.  Will order Warfarin 10mg  again for tonight and reheck INR with am labs.    Stormy CardKatsoudas,Curt Oatis K, [facility] York EndoscopyRPH Clinical Pharmacist 08/07/2017, 8:56 AM

## 2017-08-07 NOTE — Progress Notes (Signed)
ANTICOAGULATION CONSULT NOTE - follow up  Pharmacy Consult for heparin drip and warfarin Indication: pulmonary embolus  Allergies  Allergen Reactions  . Clindamycin/Lincomycin Rash  . Sulfa Antibiotics Rash    Patient Measurements: Height: 5\' 7"  (170.2 cm) Weight: (!) 345 lb 1.6 oz (156.5 kg) IBW/kg (Calculated) : 66.1 Heparin Dosing Weight: 105 kg   Vital Signs: Temp: 98.9 F (37.2 C) (12/09 0401) Temp Source: Oral (12/09 0401) BP: 124/85 (12/09 0401) Pulse Rate: 79 (12/09 0401)  Labs: Recent Labs    08/05/17 0507 08/05/17 1051 08/06/17 0546 08/07/17 0510  HGB 13.7  --  14.3  --   HCT 41.5  --  42.9  --   PLT 254  --  302  --   LABPROT  --   --  14.5 14.6  INR  --   --  1.14 1.15  HEPARINUNFRC 0.50 0.44 0.41 0.40  CREATININE 1.34*  --   --   --     Estimated Creatinine Clearance: 98.6 mL/min (A) (by C-G formula based on SCr of 1.34 mg/dL (H)).   Medical History: Past Medical History:  Diagnosis Date  . CHF (congestive heart failure) (HCC)   . Diabetes mellitus without complication (HCC)    per pt-pre diabetic-Dr Dario GuardianJadali stated said pt  . DVT (deep venous thrombosis) (HCC)   . Hypertension   . Peripheral vascular disease (HCC)   . Presence of IVC filter     Assessment: 46 year old male with large PE, presents subtherapeutic on warfarin 9mg  daily (per patient). INR 1.2.   Spoke with patient regarding warfarin on 12/7 and patient reports a recent change in his diet with an increase in the amount of greens and vitamin K containing foods. We discussed importance of a consistent diet, INR monitoring, and advised patient to report changes in diet to clinic for INR monitoring.  Warfarin to be resumed tonight.   Goal of Therapy:  Heparin level 0.3-0.7 units/ml Monitor platelets by anticoagulation protocol: Yes   Plan:  Confirmatory HL = 0.44 is therapeutic. Continue heparin infusion at current rate of 2200 units/hr. Will recheck HL and CBC with AM labs  tomorrow per protocol. Ordered warfarin 10 mg PO this evening. Warfarin counseling completed on 12/7.  12/8 @ 0500 HL 0.41 therapeutic. Will continue current rate and will recheck w/ am labs. Will order Warfarin 10mg  again for tonight and reheck INR with am labs.   12/9 @ 0500 HL 0.40 therapeutic. Will continue current rate and will recheck w/ am labs. CBC stable.  Thomasene Rippleavid  Terie Lear, [facility] Center IncRPH, PharmD, BCPS Clinical Pharmacist 08/07/2017, 10:42 AM

## 2017-08-07 NOTE — Progress Notes (Signed)
ANTICOAGULATION CONSULT NOTE - follow up  Pharmacy Consult for heparin drip and warfarin Indication: pulmonary embolus  Allergies  Allergen Reactions  . Clindamycin/Lincomycin Rash  . Sulfa Antibiotics Rash    Patient Measurements: Height: 5\' 7"  (170.2 cm) Weight: (!) 345 lb 1.6 oz (156.5 kg) IBW/kg (Calculated) : 66.1 Heparin Dosing Weight: 105 kg   Vital Signs: Temp: 98 F (36.7 C) (12/09 0800) Temp Source: Oral (12/09 0800) BP: 130/80 (12/09 0800) Pulse Rate: 77 (12/09 0800)  Labs: Recent Labs    08/05/17 0507 08/05/17 1051 08/06/17 0546 08/07/17 0510  HGB 13.7  --  14.3  --   HCT 41.5  --  42.9  --   PLT 254  --  302  --   LABPROT  --   --  14.5 14.6  INR  --   --  1.14 1.15  HEPARINUNFRC 0.50 0.44 0.41 0.40  CREATININE 1.34*  --   --   --     Estimated Creatinine Clearance: 98.6 mL/min (A) (by C-G formula based on SCr of 1.34 mg/dL (H)).   Medical History: Past Medical History:  Diagnosis Date  . CHF (congestive heart failure) (HCC)   . Diabetes mellitus without complication (HCC)    per pt-pre diabetic-Dr Dario GuardianJadali stated said pt  . DVT (deep venous thrombosis) (HCC)   . Hypertension   . Peripheral vascular disease (HCC)   . Presence of IVC filter     Assessment: 46 year old male with large PE, presents subtherapeutic on warfarin 9mg  daily (per patient). INR 1.2.   Spoke with patient regarding warfarin on 12/7 and patient reports a recent change in his diet with an increase in the amount of greens and vitamin K containing foods. We discussed importance of a consistent diet, INR monitoring, and advised patient to report changes in diet to clinic for INR monitoring.  Warfarin to be resumed tonight.   Goal of Therapy:  Heparin level 0.3-0.7 units/ml Monitor platelets by anticoagulation protocol: Yes   Plan:  12/7 Confirmatory HL = 0.44 is therapeutic. Continue heparin infusion at current rate of 2200 units/hr.  Ordered warfarin 10 mg PO this  evening. Warfarin counseling completed on 12/7.  12/8 @ 0500 HL 0.41 therapeutic. Will continue current rate and will recheck w/ am labs. 12/8 @ 0546 INR = 1.14. Will order Warfarin 10mg  again for tonight and reheck INR with am labs.   12/9 @ 0500 HL 0.40 therapeutic. Will continue current rate and will recheck w/ am labs. CBC stable. 12/9 @ 0510 INR = 1.15.  Will order Warfarin 10mg  again for tonight and reheck INR with am labs.   12/9 spoke with MD, will increase Warfarin dose to 12 mg for tonight.  Clovia CuffLisa Paislee Szatkowski, PharmD, BCPS 08/07/2017 2:22 PM

## 2017-08-08 ENCOUNTER — Inpatient Hospital Stay: Payer: 59

## 2017-08-08 LAB — CBC
HEMATOCRIT: 42.4 % (ref 40.0–52.0)
Hemoglobin: 14.2 g/dL (ref 13.0–18.0)
MCH: 30.4 pg (ref 26.0–34.0)
MCHC: 33.4 g/dL (ref 32.0–36.0)
MCV: 91 fL (ref 80.0–100.0)
PLATELETS: 289 10*3/uL (ref 150–440)
RBC: 4.66 MIL/uL (ref 4.40–5.90)
RDW: 14.3 % (ref 11.5–14.5)
WBC: 4.1 10*3/uL (ref 3.8–10.6)

## 2017-08-08 LAB — PROTIME-INR
INR: 1.19
PROTHROMBIN TIME: 15 s (ref 11.4–15.2)

## 2017-08-08 LAB — HEPARIN LEVEL (UNFRACTIONATED): Heparin Unfractionated: 0.38 IU/mL (ref 0.30–0.70)

## 2017-08-08 MED ORDER — FLUTICASONE PROPIONATE 50 MCG/ACT NA SUSP
2.0000 | Freq: Every day | NASAL | Status: DC
  Administered 2017-08-08: 2 via NASAL
  Filled 2017-08-08: qty 16

## 2017-08-08 MED ORDER — FLUTICASONE PROPIONATE 50 MCG/ACT NA SUSP
2.0000 | Freq: Two times a day (BID) | NASAL | Status: DC
  Administered 2017-08-08 – 2017-08-12 (×8): 2 via NASAL
  Filled 2017-08-08: qty 16

## 2017-08-08 MED ORDER — IPRATROPIUM BROMIDE 0.06 % NA SOLN
2.0000 | Freq: Two times a day (BID) | NASAL | Status: DC
  Administered 2017-08-08 – 2017-08-12 (×8): 2 via NASAL
  Filled 2017-08-08: qty 15

## 2017-08-08 MED ORDER — WARFARIN SODIUM 6 MG PO TABS
12.0000 mg | ORAL_TABLET | Freq: Once | ORAL | Status: AC
  Administered 2017-08-08: 12 mg via ORAL
  Filled 2017-08-08: qty 2

## 2017-08-08 NOTE — Progress Notes (Signed)
Patient ID: Ryan Silversmithharles A Milliron, male   DOB: Feb 01, 1971, 46 y.o.   MRN: [id number]  [facility] PROGRESS NOTE  Ryan [name] [id number] DOB: Feb 01, 1971 DOA: 08/03/2017 PCP: Sherrie MustacheJadali, Fayegh, MD  HPI/Subjective: Patient still with a lot of cough.  Now states he is having postnasal drip and he feels a dripping down.  Objective: Vitals:   08/08/17 0805 08/08/17 1534  BP: (!) 150/85 139/80  Pulse: 81 75  Resp:    Temp: 97.7 F (36.5 C) 98 F (36.7 C)  SpO2: 96% 92%    Filed Weights   08/06/17 0509 08/07/17 0424 08/08/17 0500  Weight: (!) 157.9 kg (348 lb 1.6 oz) (!) 156.5 kg (345 lb 1.6 oz) (!) 157.2 kg (346 lb 8 oz)    ROS: Review of Systems  Constitutional: Negative for chills and fever.  Eyes: Negative for blurred vision.  Respiratory: Positive for cough. Negative for shortness of breath.   Cardiovascular: Negative for chest pain.  Gastrointestinal: Negative for abdominal pain, constipation, diarrhea, nausea and vomiting.  Genitourinary: Negative for dysuria.  Musculoskeletal: Negative for joint pain.  Neurological: Negative for dizziness and headaches.   Exam: Physical Exam  Constitutional: He is oriented to person, place, and time.  HENT:  Nose: No mucosal edema.  Mouth/Throat: No oropharyngeal exudate or posterior oropharyngeal edema.  Eyes: Conjunctivae, EOM and lids are normal. Pupils are equal, round, and reactive to light.  Neck: No JVD present. Carotid bruit is not present. No edema present. No thyroid mass and no thyromegaly present.  Cardiovascular: S1 normal and S2 normal. Exam reveals no gallop.  No murmur heard. Pulses:      Dorsalis pedis pulses are 1+ on the right side, and 1+ on the left side.  Respiratory: No respiratory distress. He has decreased breath sounds in the right lower field and the left lower field. He has no wheezes. He has no rhonchi. He has no rales.  GI: Soft. Bowel sounds are normal. There is no tenderness.  Musculoskeletal:   Right ankle: He exhibits swelling.       Left ankle: He exhibits swelling.  Lymphadenopathy:    He has no cervical adenopathy.  Neurological: He is alert and oriented to person, place, and time. No cranial nerve deficit.  Skin: Skin is warm. No rash noted. Nails show no clubbing.  Psychiatric: He has a normal mood and affect.      Data Reviewed: Basic Metabolic Panel: Recent Labs  Lab 08/03/17 1736 08/04/17 0216 08/05/17 0507  NA 135 135 136  K 4.5 3.8 4.0  CL 100* 103 104  CO2 25 26 23   GLUCOSE 102* 96 107*  BUN 17 17 18   CREATININE 1.48* 1.53* 1.34*  CALCIUM 9.2 8.5* 8.5*   CBC: Recent Labs  Lab 08/03/17 1736 08/04/17 0216 08/05/17 0507 08/06/17 0546 08/08/17 0502  WBC 6.6 7.2 4.7 4.1 4.1  NEUTROABS 4.2  --   --   --   --   HGB 15.5 13.9 13.7 14.3 14.2  HCT 47.5 42.2 41.5 42.9 42.4  MCV 91.1 91.2 91.4 92.0 91.0  PLT 300 263 254 302 289   Cardiac Enzymes: Recent Labs  Lab 08/03/17 1736  TROPONINI <0.03   BNP (last 3 results) Recent Labs    11/22/16 0937 01/03/17 1119 08/03/17 1736  BNP 24.0 20.0 14.0    CBG: Recent Labs  Lab 08/04/17 1407  GLUCAP 109*     Scheduled Meds: . fluticasone  2 spray Each Nare Daily  . Influenza  vac split quadrivalent PF  0.5 mL Intramuscular Tomorrow-1000  . ipratropium  2 spray Each Nare BID  . lisinopril  10 mg Oral Daily  . multivitamin with minerals  1 tablet Oral Daily  . pneumococcal 23 valent vaccine  0.5 mL Intramuscular Tomorrow-1000  . protein supplement shake  11 oz Oral BID BM  . warfarin  12 mg Oral ONCE-1800  . Warfarin - Pharmacist Dosing Inpatient   Does not apply q1800   Continuous Infusions: . heparin 2,200 Units/hr (08/07/17 2248)    Assessment/Plan:  1. 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  Pulmonary infarct.  Patient came in with subtherapeutic INR.  Patient's been eating a lot of spinach recently.  Case discussed with pharmacy and Coumadin dose will be increased to 12.5 mg tonight and  recheck INR tomorrow..  Will need to be therapeutic prior to disposition.  Continue heparin drip at this time. 2. Cough.  Likely secondary to postnasal drip start Flonase and ipratropium nasal spray.  Chest x-ray repeated and is negative. 3. Essential hypertension on lisinopril 4. Morbid obesity weight loss needed   Code Status:     Code Status Orders  (From admission, onward)        Start     Ordered   08/03/17 2234  Full code  Continuous     08/03/17 2233    Code Status History    Date Active Date Inactive Code Status Order ID Comments User Context   01/03/2017 18:39 01/04/2017 21:56 Full Code [id number]  Houston SirenSainani, Vivek J, MD Inpatient   11/22/2016 16:51 11/24/2016 21:25 Full Code [id number]  [name], Qing, MD Inpatient   12/28/2014 23:45 01/15/2015 22:11 Full Code [id number]  Gasper SellsVickers, Pamela, RN Inpatient     Disposition Plan: Home once INR therapeutic  Time spent: 25 minutes  Mina Babula [employer]  [facility]           Patient ID: Ryan Silversmithharles A Beaston, male   DOB: 08-03-1971, 46 y.o.   MRN: [id number]

## 2017-08-08 NOTE — Progress Notes (Addendum)
ANTICOAGULATION CONSULT NOTE - follow up  Pharmacy Consult for heparin drip and warfarin Indication: pulmonary embolus  Allergies  Allergen Reactions  . Clindamycin/Lincomycin Rash  . Sulfa Antibiotics Rash    Patient Measurements: Height: 5\' 7"  (170.2 cm) Weight: (!) 346 lb 8 oz (157.2 kg) IBW/kg (Calculated) : 66.1 Heparin Dosing Weight: 105 kg   Vital Signs: Temp: 98.3 F (36.8 C) (12/10 0413) Temp Source: Oral (12/10 0413) BP: 121/76 (12/10 0413) Pulse Rate: 83 (12/10 0413)  Labs: Recent Labs    08/06/17 0546 08/07/17 0510 08/08/17 0502  HGB 14.3  --  14.2  HCT 42.9  --  42.4  PLT 302  --  289  LABPROT 14.5 14.6 15.0  INR 1.14 1.15 1.19  HEPARINUNFRC 0.41 0.40 0.38    Estimated Creatinine Clearance: 98.8 mL/min (A) (by C-G formula based on SCr of 1.34 mg/dL (H)).   Medical History: Past Medical History:  Diagnosis Date  . CHF (congestive heart failure) (HCC)   . Diabetes mellitus without complication (HCC)    per pt-pre diabetic-Dr Dario GuardianJadali stated said pt  . DVT (deep venous thrombosis) (HCC)   . Hypertension   . Peripheral vascular disease (HCC)   . Presence of IVC filter     Assessment: 46 year old male with large PE, presents subtherapeutic on warfarin 9mg  daily (per patient). INR 1.2.   Spoke with patient regarding warfarin on 12/7 and patient reports a recent change in his diet with an increase in the amount of greens and vitamin K containing foods. We discussed importance of a consistent diet, INR monitoring, and advised patient to report changes in diet to clinic for INR monitoring.  Warfarin to be resumed tonight.   Goal of Therapy:  Heparin level 0.3-0.7 units/ml Monitor platelets by anticoagulation protocol: Yes   Plan:  12/7 Confirmatory HL = 0.44 is therapeutic. Continue heparin infusion at current rate of 2200 units/hr.  Ordered warfarin 10 mg PO this evening. Warfarin counseling completed on 12/7.  12/8 @ 0500 HL 0.41 therapeutic.  Will continue current rate and will recheck w/ am labs. 12/8 @ 0546 INR = 1.14. Will order Warfarin 10mg  again for tonight and reheck INR with am labs.   12/9 @ 0500 HL 0.40 therapeutic. Will continue current rate and will recheck w/ am labs. CBC stable. 12/9 @ 0510 INR = 1.15.  Will order Warfarin 10mg  again for tonight and reheck INR with am labs.  12/10 @ 0502 HL 0.38 therapeutic. Will continue current rate and will recheck w/ am labs.  12/9 spoke with MD, will increase Warfarin dose to 12 mg for tonight.  Thomasene Rippleavid Josy Peaden, PharmD, BCPS Clinical Pharmacist 08/08/2017

## 2017-08-08 NOTE — Progress Notes (Signed)
ANTICOAGULATION CONSULT NOTE - follow up  Pharmacy Consult for heparin drip and warfarin Indication: pulmonary embolus  Allergies  Allergen Reactions  . Clindamycin/Lincomycin Rash  . Sulfa Antibiotics Rash    Patient Measurements: Height: 5\' 7"  (170.2 cm) Weight: (!) 346 lb 8 oz (157.2 kg) IBW/kg (Calculated) : 66.1 Heparin Dosing Weight: 105 kg   Vital Signs: Temp: 97.7 F (36.5 C) (12/10 0805) Temp Source: Oral (12/10 0805) BP: 150/85 (12/10 0805) Pulse Rate: 81 (12/10 0805)  Labs: Recent Labs    08/06/17 0546 08/07/17 0510 08/08/17 0502  HGB 14.3  --  14.2  HCT 42.9  --  42.4  PLT 302  --  289  LABPROT 14.5 14.6 15.0  INR 1.14 1.15 1.19  HEPARINUNFRC 0.41 0.40 0.38    Estimated Creatinine Clearance: 98.8 mL/min (A) (by C-G formula based on SCr of 1.34 mg/dL (H)).   Medical History: Past Medical History:  Diagnosis Date  . CHF (congestive heart failure) (HCC)   . Diabetes mellitus without complication (HCC)    per pt-pre diabetic-Dr Dario GuardianJadali stated said pt  . DVT (deep venous thrombosis) (HCC)   . Hypertension   . Peripheral vascular disease (HCC)   . Presence of IVC filter     Assessment: 46 year old male with large PE, presents subtherapeutic on warfarin 9mg  daily (per patient). INR 1.2.   RPh spoke with patient regarding warfarin on 12/7 and patient reports a recent change in his diet with an increase in the amount of greens and vitamin K containing foods. Discussed importance of a consistent diet, INR monitoring, and advised patient to report changes in diet to clinic for INR monitoring.  Warfarin Trend: 12/7    INR           Warfarin 10mg  12/8    INR 1.14   Warfarin 10mg  12/9    INR 1.15   Warfarin 12mg  12/10  INR 1.19   Heparin Infusion: Heparin infusion running at 2200 units/hr and remains in therapeutic range.  Goal of Therapy:  Heparin level 0.3-0.7 units/ml Monitor platelets by anticoagulation protocol: Yes   Plan:  Will continue  Heparin at current rate. Next Heparin level and CBC with am labs. INR level is slow to respond despite increased warfarin dosing, suspect may be resistant due to increased dietary intake of Vitamin K. Will order Warfarin 12mg  for one dose tonight and follow up on INR with am labs. Anticipate INR value will start to rise once Vitamin K levels decrease more.  Clovia CuffLisa Gladie Gravette, PharmD, BCPS 08/08/2017 1:38 PM

## 2017-08-09 LAB — HEPATIC FUNCTION PANEL
ALBUMIN: 3.6 g/dL (ref 3.5–5.0)
ALK PHOS: 51 U/L (ref 38–126)
ALT: 56 U/L (ref 17–63)
AST: 43 U/L — AB (ref 15–41)
Bilirubin, Direct: <0.1 mg/dL — ABNORMAL LOW (ref 0.1–0.5)
TOTAL PROTEIN: 7.8 g/dL (ref 6.5–8.1)
Total Bilirubin: 0.5 mg/dL (ref 0.3–1.2)

## 2017-08-09 LAB — CBC
HCT: 43.9 % (ref 40.0–52.0)
HEMOGLOBIN: 14.6 g/dL (ref 13.0–18.0)
MCH: 30.2 pg (ref 26.0–34.0)
MCHC: 33.2 g/dL (ref 32.0–36.0)
MCV: 91.1 fL (ref 80.0–100.0)
Platelets: 323 10*3/uL (ref 150–440)
RBC: 4.82 MIL/uL (ref 4.40–5.90)
RDW: 14.7 % — ABNORMAL HIGH (ref 11.5–14.5)
WBC: 4.7 10*3/uL (ref 3.8–10.6)

## 2017-08-09 LAB — PROTIME-INR
INR: 1.32
Prothrombin Time: 16.3 seconds — ABNORMAL HIGH (ref 11.4–15.2)

## 2017-08-09 LAB — HEPARIN LEVEL (UNFRACTIONATED): HEPARIN UNFRACTIONATED: 0.46 [IU]/mL (ref 0.30–0.70)

## 2017-08-09 MED ORDER — WARFARIN SODIUM 7.5 MG PO TABS
15.0000 mg | ORAL_TABLET | Freq: Once | ORAL | Status: AC
  Administered 2017-08-09: 15 mg via ORAL
  Filled 2017-08-09: qty 2

## 2017-08-09 MED ORDER — WARFARIN SODIUM 6 MG PO TABS: 12.0000 mg | ORAL_TABLET | Freq: Once | ORAL | Status: DC

## 2017-08-09 NOTE — Progress Notes (Signed)
ANTICOAGULATION CONSULT NOTE - follow up  Pharmacy Consult for heparin drip and warfarin Indication: pulmonary embolus  Allergies  Allergen Reactions  . Clindamycin/Lincomycin Rash  . Sulfa Antibiotics Rash    Patient Measurements: Height: 5\' 7"  (170.2 cm) Weight: (!) 346 lb 8 oz (157.2 kg) IBW/kg (Calculated) : 66.1 Heparin Dosing Weight: 105 kg   Vital Signs: Temp: 98.2 F (36.8 C) (12/11 0831) Temp Source: Oral (12/11 0831) BP: 131/69 (12/11 0831) Pulse Rate: 77 (12/11 0831)  Labs: Recent Labs    08/07/17 0510 08/08/17 0502 08/09/17 0456  HGB  --  14.2 14.6  HCT  --  42.4 43.9  PLT  --  289 323  LABPROT 14.6 15.0 16.3*  INR 1.15 1.19 1.32  HEPARINUNFRC 0.40 0.38 0.46    Estimated Creatinine Clearance: 98.8 mL/min (A) (by C-G formula based on SCr of 1.34 mg/dL (H)).   Medical History: Past Medical History:  Diagnosis Date  . CHF (congestive heart failure) (HCC)   . Diabetes mellitus without complication (HCC)    per pt-pre diabetic-Dr Dario GuardianJadali stated said pt  . DVT (deep venous thrombosis) (HCC)   . Hypertension   . Peripheral vascular disease (HCC)   . Presence of IVC filter     Assessment: 46 year old male with large PE, presents subtherapeutic on warfarin 9mg  daily (per patient). INR 1.2.   RPh spoke with patient regarding warfarin on 12/7 and patient reports a recent change in his diet with an increase in the amount of greens and vitamin K containing foods. Discussed importance of a consistent diet, INR monitoring, and advised patient to report changes in diet to clinic for INR monitoring.  Warfarin Trend: 12/7    INR           Warfarin 10mg  12/8    INR 1.14   Warfarin 10mg  12/9    INR 1.15   Warfarin 12mg  12/10  INR 1.19   12 mg 12/11  INR 1.32   15 mg  Heparin Infusion: Heparin infusion running at 2200 units/hr and remains in therapeutic range.  Goal of Therapy:  Heparin level 0.3-0.7 units/ml Monitor platelets by anticoagulation protocol:  Yes   Plan:  12/11 Heparin level 0.46. Will continue Heparin at current rate. Next Heparin level and CBC with am labs.  INR level is slow to respond despite increased warfarin dosing, suspect may be resistant due to increased dietary intake of Vitamin K.   INR 1.32 this AM. Will order Warfarin 15mg   one dose tonight and follow up on INR with am labs.   Gardner CandleSheema M Jacarri Gesner, PharmD, BCPS Clinical Pharmacist 08/09/2017 12:10 PM

## 2017-08-09 NOTE — Care Management (Signed)
Barrier- sub therapeutic INR in patient with acute on chronic pulmonary embolus. REamins on heparin drip

## 2017-08-09 NOTE — Progress Notes (Signed)
Patient ID: Ryan Silversmithharles A [name], male   DOB: Mar 10, 1971, 46 y.o.   MRN: [id number]   [facility] PROGRESS NOTE  Ryan SilversmithCharles A Kalan [id number] DOB: Mar 10, 1971 DOA: 08/03/2017 PCP: Sherrie MustacheJadali, Fayegh, MD  HPI/Subjective: Patient states that cough is better since starting nose sprays.  Feels okay.  No chest pain or shortness of breath.  Objective: Vitals:   08/09/17 0411 08/09/17 0831  BP: 135/87 131/69  Pulse: 83 77  Resp: 18   Temp: 98.6 F (37 C) 98.2 F (36.8 C)  SpO2: 95% 95%    Filed Weights   08/06/17 0509 08/07/17 0424 08/08/17 0500  Weight: (!) 157.9 kg (348 lb 1.6 oz) (!) 156.5 kg (345 lb 1.6 oz) (!) 157.2 kg (346 lb 8 oz)    ROS: Review of Systems  Constitutional: Negative for chills and fever.  Eyes: Negative for blurred vision.  Respiratory: Positive for cough. Negative for shortness of breath.   Cardiovascular: Negative for chest pain.  Gastrointestinal: Negative for abdominal pain, constipation, diarrhea, nausea and vomiting.  Genitourinary: Negative for dysuria.  Musculoskeletal: Negative for joint pain.  Neurological: Negative for dizziness and headaches.   Exam: Physical Exam  Constitutional: He is oriented to person, place, and time.  HENT:  Nose: No mucosal edema.  Mouth/Throat: No oropharyngeal exudate or posterior oropharyngeal edema.  Eyes: Conjunctivae, EOM and lids are normal. Pupils are equal, round, and reactive to light.  Neck: No JVD present. Carotid bruit is not present. No edema present. No thyroid mass and no thyromegaly present.  Cardiovascular: S1 normal and S2 normal. Exam reveals no gallop.  No murmur heard. Pulses:      Dorsalis pedis pulses are 1+ on the right side, and 1+ on the left side.  Respiratory: No respiratory distress. He has decreased breath sounds in the right lower field and the left lower field. He has no wheezes. He has no rhonchi. He has no rales.  GI: Soft. Bowel sounds are normal. There is no tenderness.  Musculoskeletal:        Right ankle: He exhibits swelling.       Left ankle: He exhibits swelling.  Lymphadenopathy:    He has no cervical adenopathy.  Neurological: He is alert and oriented to person, place, and time. No cranial nerve deficit.  Skin: Skin is warm. No rash noted. Nails show no clubbing.  Psychiatric: He has a normal mood and affect.      Data Reviewed: Basic Metabolic Panel: Recent Labs  Lab 08/03/17 1736 08/04/17 0216 08/05/17 0507  NA 135 135 136  K 4.5 3.8 4.0  CL 100* 103 104  CO2 25 26 23   GLUCOSE 102* 96 107*  BUN 17 17 18   CREATININE 1.48* 1.53* 1.34*  CALCIUM 9.2 8.5* 8.5*   CBC: Recent Labs  Lab 08/03/17 1736 08/04/17 0216 08/05/17 0507 08/06/17 0546 08/08/17 0502 08/09/17 0456  WBC 6.6 7.2 4.7 4.1 4.1 4.7  NEUTROABS 4.2  --   --   --   --   --   HGB 15.5 13.9 13.7 14.3 14.2 14.6  HCT 47.5 42.2 41.5 42.9 42.4 43.9  MCV 91.1 91.2 91.4 92.0 91.0 91.1  PLT 300 263 254 302 289 323   Cardiac Enzymes: Recent Labs  Lab 08/03/17 1736  TROPONINI <0.03   BNP (last 3 results) Recent Labs    11/22/16 0937 01/03/17 1119 08/03/17 1736  BNP 24.0 20.0 14.0    CBG: Recent Labs  Lab 08/04/17 1407  GLUCAP 109*  Scheduled Meds: . fluticasone  2 spray Each Nare BID  . Influenza vac split quadrivalent PF  0.5 mL Intramuscular Tomorrow-1000  . ipratropium  2 spray Each Nare BID  . lisinopril  10 mg Oral Daily  . multivitamin with minerals  1 tablet Oral Daily  . pneumococcal 23 valent vaccine  0.5 mL Intramuscular Tomorrow-1000  . protein supplement shake  11 oz Oral BID BM  . warfarin  15 mg Oral ONCE-1800  . Warfarin - Pharmacist Dosing Inpatient   Does not apply q1800   Continuous Infusions: . heparin 2,200 Units/hr (08/09/17 1032)    Assessment/Plan:  1. 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  Pulmonary infarct.  Patient came in with subtherapeutic INR.  Case discussed with pharmacy and Coumadin dose will be increased to 15 mg tonight and  recheck INR tomorrow. Will need to be therapeutic prior to disposition.  Continue heparin drip at this time. 2. Cough.  Likely secondary to postnasal drip start Flonase and ipratropium nasal spray.  Chest x-ray repeated and is negative. 3. Essential hypertension on lisinopril 4. Morbid obesity weight loss needed   Code Status:     Code Status Orders  (From admission, onward)        Start     Ordered   08/03/17 2234  Full code  Continuous     08/03/17 2233    Code Status History    Date Active Date Inactive Code Status Order ID Comments User Context   01/03/2017 18:39 01/04/2017 21:56 Full Code [id number]  [place] SirenSainani, Vivek J, MD Inpatient   11/22/2016 16:51 11/24/2016 21:25 Full Code [id number]  [name], Qing, MD Inpatient   12/28/2014 23:45 01/15/2015 22:11 Full Code [id number]  Gasper SellsVickers, Pamela, RN Inpatient     Disposition Plan: Home once INR therapeutic  Time spent: 24 minutes  Rodnisha Blomgren [employer]  [facility]

## 2017-08-09 NOTE — Progress Notes (Addendum)
ANTICOAGULATION CONSULT NOTE - follow up  Pharmacy Consult for heparin drip and warfarin Indication: pulmonary embolus  Allergies  Allergen Reactions  . Clindamycin/Lincomycin Rash  . Sulfa Antibiotics Rash    Patient Measurements: Height: 5\' 7"  (170.2 cm) Weight: (!) 346 lb 8 oz (157.2 kg) IBW/kg (Calculated) : 66.1 Heparin Dosing Weight: 105 kg   Vital Signs: Temp: 98.6 F (37 C) (12/11 0411) Temp Source: Oral (12/11 0411) BP: 135/87 (12/11 0411) Pulse Rate: 83 (12/11 0411)  Labs: Recent Labs    08/07/17 0510 08/08/17 0502 08/09/17 0456  HGB  --  14.2 14.6  HCT  --  42.4 43.9  PLT  --  289 323  LABPROT 14.6 15.0 16.3*  INR 1.15 1.19 1.32  HEPARINUNFRC 0.40 0.38 0.46    Estimated Creatinine Clearance: 98.8 mL/min (A) (by C-G formula based on SCr of 1.34 mg/dL (H)).   Medical History: Past Medical History:  Diagnosis Date  . CHF (congestive heart failure) (HCC)   . Diabetes mellitus without complication (HCC)    per pt-pre diabetic-Dr Dario GuardianJadali stated said pt  . DVT (deep venous thrombosis) (HCC)   . Hypertension   . Peripheral vascular disease (HCC)   . Presence of IVC filter     Assessment: 46 year old male with large PE, presents subtherapeutic on warfarin 9mg  daily (per patient). INR 1.2.   RPh spoke with patient regarding warfarin on 12/7 and patient reports a recent change in his diet with an increase in the amount of greens and vitamin K containing foods. Discussed importance of a consistent diet, INR monitoring, and advised patient to report changes in diet to clinic for INR monitoring.  Warfarin Trend: 12/7    INR           Warfarin 10mg  12/8    INR 1.14   Warfarin 10mg  12/9    INR 1.15   Warfarin 12mg  12/10  INR 1.19   12 mg 12/11  INR 1.32   12 mg  Heparin Infusion: Heparin infusion running at 2200 units/hr and remains in therapeutic range.  Goal of Therapy:  Heparin level 0.3-0.7 units/ml Monitor platelets by anticoagulation protocol: Yes   Plan:  Will continue Heparin at current rate. Next Heparin level and CBC with am labs. INR level is slow to respond despite increased warfarin dosing, suspect may be resistant due to increased dietary intake of Vitamin K. Will order Warfarin 12mg  for one dose tonight and follow up on INR with am labs. Anticipate INR value will start to rise once Vitamin K levels decrease more.  12/11 AM heparin level 0.46. Continue current regimen. Recheck heparin level and CBC with tomorrow AM labs. INR 1.32. 12 mg x1 again ordered for this PM warfarin dose. INR with AM labs.  Clovia CuffLisa Kluttz, PharmD, BCPS 08/09/2017 5:53 AM

## 2017-08-10 LAB — CBC
HEMATOCRIT: 44.2 % (ref 40.0–52.0)
HEMOGLOBIN: 14.5 g/dL (ref 13.0–18.0)
MCH: 30.4 pg (ref 26.0–34.0)
MCHC: 32.9 g/dL (ref 32.0–36.0)
MCV: 92.5 fL (ref 80.0–100.0)
Platelets: 324 10*3/uL (ref 150–440)
RBC: 4.78 MIL/uL (ref 4.40–5.90)
RDW: 14.6 % — AB (ref 11.5–14.5)
WBC: 4.9 10*3/uL (ref 3.8–10.6)

## 2017-08-10 LAB — HEPARIN LEVEL (UNFRACTIONATED): Heparin Unfractionated: 0.52 IU/mL (ref 0.30–0.70)

## 2017-08-10 LAB — PROTIME-INR
INR: 1.6
Prothrombin Time: 18.9 seconds — ABNORMAL HIGH (ref 11.4–15.2)

## 2017-08-10 MED ORDER — WARFARIN SODIUM 7.5 MG PO TABS
15.0000 mg | ORAL_TABLET | Freq: Once | ORAL | Status: AC
  Administered 2017-08-10: 15 mg via ORAL
  Filled 2017-08-10: qty 2

## 2017-08-10 NOTE — Progress Notes (Signed)
Patient ID: Ryan Silversmithharles A [name], male   DOB: 12/16/1970, 46 y.o.   MRN: [id number]   [facility] PROGRESS NOTE  Ryan SilversmithCharles A Kozinski WUJ:[id number] DOB: 12/16/1970 DOA: 08/03/2017 PCP: Sherrie MustacheJadali, Fayegh, MD  HPI/Subjective: Patient states that cough is better since starting nose sprays.  Feels okay.  No chest pain or shortness of breath.  Objective: Vitals:   08/10/17 0836 08/10/17 1007  BP: 136/76 (!) 114/55  Pulse: 97 94  Resp:  18  Temp: 98.6 F (37 C)   SpO2: 97% 93%    Filed Weights   08/07/17 0424 08/08/17 0500 08/10/17 0448  Weight: (!) 156.5 kg (345 lb 1.6 oz) (!) 157.2 kg (346 lb 8 oz) (!) 155.9 kg (343 lb 9.6 oz)    ROS: Review of Systems  Constitutional: Negative for chills and fever.  Eyes: Negative for blurred vision.  Respiratory: Positive for cough. Negative for shortness of breath.   Cardiovascular: Negative for chest pain.  Gastrointestinal: Negative for abdominal pain, constipation, diarrhea, nausea and vomiting.  Genitourinary: Negative for dysuria.  Musculoskeletal: Negative for joint pain.  Neurological: Negative for dizziness and headaches.   Exam: Physical Exam  Constitutional: He is oriented to person, place, and time.  HENT:  Nose: No mucosal edema.  Mouth/Throat: No oropharyngeal exudate or posterior oropharyngeal edema.  Eyes: Conjunctivae, EOM and lids are normal. Pupils are equal, round, and reactive to light.  Neck: No JVD present. Carotid bruit is not present. No edema present. No thyroid mass and no thyromegaly present.  Cardiovascular: S1 normal and S2 normal. Exam reveals no gallop.  No murmur heard. Pulses:      Dorsalis pedis pulses are 1+ on the right side, and 1+ on the left side.  Respiratory: No respiratory distress. He has decreased breath sounds in the right lower field and the left lower field. He has no wheezes. He has no rhonchi. He has no rales.  GI: Soft. Bowel sounds are normal. There is no tenderness.  Musculoskeletal:       Right  ankle: He exhibits swelling.       Left ankle: He exhibits swelling.  Lymphadenopathy:    He has no cervical adenopathy.  Neurological: He is alert and oriented to person, place, and time. No cranial nerve deficit.  Skin: Skin is warm. No rash noted. Nails show no clubbing.  Psychiatric: He has a normal mood and affect.      Data Reviewed: Basic Metabolic Panel: Recent Labs  Lab 08/03/17 1736 08/04/17 0216 08/05/17 0507  NA 135 135 136  K 4.5 3.8 4.0  CL 100* 103 104  CO2 25 26 23   GLUCOSE 102* 96 107*  BUN 17 17 18   CREATININE 1.48* 1.53* 1.34*  CALCIUM 9.2 8.5* 8.5*   CBC: Recent Labs  Lab 08/03/17 1736  08/05/17 0507 08/06/17 0546 08/08/17 0502 08/09/17 0456 08/10/17 0316  WBC 6.6   < > 4.7 4.1 4.1 4.7 4.9  NEUTROABS 4.2  --   --   --   --   --   --   HGB 15.5   < > 13.7 14.3 14.2 14.6 14.5  HCT 47.5   < > 41.5 42.9 42.4 43.9 44.2  MCV 91.1   < > 91.4 92.0 91.0 91.1 92.5  PLT 300   < > 254 302 289 323 324   < > = values in this interval not displayed.   Cardiac Enzymes: Recent Labs  Lab 08/03/17 1736  TROPONINI <0.03   BNP (last  3 results) Recent Labs    11/22/16 0937 01/03/17 1119 08/03/17 1736  BNP 24.0 20.0 14.0    CBG: Recent Labs  Lab 08/04/17 1407  GLUCAP 109*     Scheduled Meds: . fluticasone  2 spray Each Nare BID  . Influenza vac split quadrivalent PF  0.5 mL Intramuscular Tomorrow-1000  . ipratropium  2 spray Each Nare BID  . lisinopril  10 mg Oral Daily  . multivitamin with minerals  1 tablet Oral Daily  . pneumococcal 23 valent vaccine  0.5 mL Intramuscular Tomorrow-1000  . protein supplement shake  11 oz Oral BID BM  . warfarin  15 mg Oral ONCE-1800  . Warfarin - Pharmacist Dosing Inpatient   Does not apply q1800   Continuous Infusions: . heparin 2,200 Units/hr (08/10/17 1014)    Assessment/Plan:  1. 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  Pulmonary infarct.  Patient came in with subtherapeutic INR.  Case discussed with  pharmacy.  INR 1.6 today.  Hopefully will be therapeutic tomorrow.  Coumadin 15 mg again tonight.  Will need to be therapeutic prior to disposition.  Continue heparin drip at this time. 2. Cough.  Likely secondary to postnasal drip start Flonase and ipratropium nasal spray.  Chest x-ray repeated and is negative. 3. Essential hypertension on lisinopril 4. Morbid obesity weight loss needed   Code Status:     Code Status Orders  (From admission, onward)        Start     Ordered   08/03/17 2234  Full code  Continuous     08/03/17 2233    Code Status History    Date Active Date Inactive Code Status Order ID Comments User Context   01/03/2017 18:39 01/04/2017 21:56 Full Code [id number]  Houston SirenSainani, Vivek J, MD Inpatient   11/22/2016 16:51 11/24/2016 21:25 Full Code [id number]  [name], Qing, MD Inpatient   12/28/2014 23:45 01/15/2015 22:11 Full Code [id number]  Gasper SellsVickers, Pamela, RN Inpatient     Disposition Plan: Home once INR therapeutic  Time spent: 20 minutes  Halsey Hammen [employer]  [facility]

## 2017-08-10 NOTE — Progress Notes (Addendum)
ANTICOAGULATION CONSULT NOTE - follow up  Pharmacy Consult for heparin drip and warfarin Indication: pulmonary embolus  Allergies  Allergen Reactions  . Clindamycin/Lincomycin Rash  . Sulfa Antibiotics Rash    Patient Measurements: Height: 5\' 7"  (170.2 cm) Weight: (!) 343 lb 9.6 oz (155.9 kg) IBW/kg (Calculated) : 66.1 Heparin Dosing Weight: 105 kg   Vital Signs: Temp: 98.6 F (37 C) (12/12 0836) Temp Source: Oral (12/12 0836) BP: 136/76 (12/12 0836) Pulse Rate: 97 (12/12 0836)  Labs: Recent Labs    08/08/17 0502 08/09/17 0456 08/10/17 0316  HGB 14.2 14.6 14.5  HCT 42.4 43.9 44.2  PLT 289 323 324  LABPROT 15.0 16.3* 18.9*  INR 1.19 1.32 1.60  HEPARINUNFRC 0.38 0.46 0.52    Estimated Creatinine Clearance: 98.3 mL/min (A) (by C-G formula based on SCr of 1.34 mg/dL (H)).   Medical History: Past Medical History:  Diagnosis Date  . CHF (congestive heart failure) (HCC)   . Diabetes mellitus without complication (HCC)    per pt-pre diabetic-Dr Dario GuardianJadali stated said pt  . DVT (deep venous thrombosis) (HCC)   . Hypertension   . Peripheral vascular disease (HCC)   . Presence of IVC filter     Assessment: 46 year old male with large PE, presents subtherapeutic on warfarin 9mg  daily (per patient). INR 1.2.   RPh spoke with patient regarding warfarin on 12/7 and patient reports a recent change in his diet with an increase in the amount of greens and vitamin K containing foods. Discussed importance of a consistent diet, INR monitoring, and advised patient to report changes in diet to clinic for INR monitoring.  Warfarin Trend: 12/7    INR           Warfarin 10mg  12/8    INR 1.14   Warfarin 10mg  12/9    INR 1.15   Warfarin 12mg  12/10  INR 1.19   12 mg 12/11  INR 1.32   15 mg  12/12  INR 1.60   15 mg   Heparin Infusion: Heparin infusion running at 2200 units/hr and remains in therapeutic range.  Goal of Therapy:  Heparin level 0.3-0.7 units/ml Monitor platelets by  anticoagulation protocol: Yes   Plan:  12/12 Heparin level 0.52. Will continue Heparin at current rate. Next Heparin level and CBC with am labs.  INR level is slow to respond despite increased warfarin dosing, suspect may be resistant due to increased dietary intake of Vitamin K.   INR 1.600 his AM. Will order Warfarin 15mg   one dose tonight and follow up on INR with am labs.   Ryan [name],Ryan [name], PharmD, BCPS Clinical Pharmacist 08/10/2017 9:59 AM    12/13 AM heparin level 0.61. Continue current regimen. Recheck heparin level and CBC with tomorrow AM labs.  Ryan [name], PharmD, BCPS  08/11/17 5:47 AM

## 2017-08-10 NOTE — Plan of Care (Signed)
  Progressing Education: Knowledge of General Education information will improve 08/10/2017 1103 - Progressing by Rigoberto NoelMorales, Jeanett Antonopoulos Y, RN Health Behavior/Discharge Planning: Ability to manage health-related needs will improve 08/10/2017 1103 - Progressing by Rigoberto NoelMorales, Virlan Kempker Y, RN Clinical Measurements: Ability to maintain clinical measurements within normal limits will improve 08/10/2017 1103 - Progressing by Rigoberto NoelMorales, Medardo Hassing Y, RN

## 2017-08-11 LAB — PROTIME-INR
INR: 1.79
INR: 1.87
PROTHROMBIN TIME: 21.4 s — AB (ref 11.4–15.2)
Prothrombin Time: 20.6 seconds — ABNORMAL HIGH (ref 11.4–15.2)

## 2017-08-11 LAB — CBC
HCT: 45.2 % (ref 40.0–52.0)
Hemoglobin: 14.9 g/dL (ref 13.0–18.0)
MCH: 30.1 pg (ref 26.0–34.0)
MCHC: 33 g/dL (ref 32.0–36.0)
MCV: 91.3 fL (ref 80.0–100.0)
PLATELETS: 323 10*3/uL (ref 150–440)
RBC: 4.94 MIL/uL (ref 4.40–5.90)
RDW: 14.7 % — AB (ref 11.5–14.5)
WBC: 5 10*3/uL (ref 3.8–10.6)

## 2017-08-11 LAB — HEPARIN LEVEL (UNFRACTIONATED): HEPARIN UNFRACTIONATED: 0.61 [IU]/mL (ref 0.30–0.70)

## 2017-08-11 MED ORDER — WARFARIN SODIUM 7.5 MG PO TABS
15.0000 mg | ORAL_TABLET | Freq: Once | ORAL | Status: AC
  Administered 2017-08-11: 15 mg via ORAL
  Filled 2017-08-11: qty 2

## 2017-08-11 NOTE — Progress Notes (Addendum)
ANTICOAGULATION CONSULT NOTE - follow up  Pharmacy Consult for heparin drip and warfarin Indication: pulmonary embolus  Allergies  Allergen Reactions  . Clindamycin/Lincomycin Rash  . Sulfa Antibiotics Rash    Patient Measurements: Height: 5\' 7"  (170.2 cm) Weight: (!) 343 lb 6.4 oz (155.8 kg) IBW/kg (Calculated) : 66.1 Heparin Dosing Weight: 105 kg   Vital Signs: Temp: 98.2 F (36.8 C) (12/13 0749) Temp Source: Oral (12/13 0749) BP: 131/70 (12/13 0749) Pulse Rate: 88 (12/13 0749)  Labs: Recent Labs    08/09/17 0456 08/10/17 0316 08/11/17 0448  HGB 14.6 14.5 14.9  HCT 43.9 44.2 45.2  PLT 323 324 323  LABPROT 16.3* 18.9* 20.6*  INR 1.32 1.60 1.79  HEPARINUNFRC 0.46 0.52 0.61    Estimated Creatinine Clearance: 98.3 mL/min (A) (by C-G formula based on SCr of 1.34 mg/dL (H)).   Medical History: Past Medical History:  Diagnosis Date  . CHF (congestive heart failure) (HCC)   . Diabetes mellitus without complication (HCC)    per pt-pre diabetic-Dr Dario GuardianJadali stated said pt  . DVT (deep venous thrombosis) (HCC)   . Hypertension   . Peripheral vascular disease (HCC)   . Presence of IVC filter     Assessment: 46 year old male with large PE, presents subtherapeutic on warfarin 9mg  daily (per patient). INR 1.2.   RPh spoke with patient regarding warfarin on 12/7 and patient reports a recent change in his diet with an increase in the amount of greens and vitamin K containing foods. Discussed importance of a consistent diet, INR monitoring, and advised patient to report changes in diet to clinic for INR monitoring.  Warfarin Trend: 12/7    INR           Warfarin 10mg  12/8    INR 1.14   Warfarin 10mg  12/9    INR 1.15   Warfarin 12mg  12/10  INR 1.19   12 mg 12/11  INR 1.32   15 mg  12/12  INR 1.60   15 mg  12/13 INR  1.79          Heparin Infusion: Heparin infusion running at 2200 units/hr and remains in therapeutic range.  Goal of Therapy:  Heparin level 0.3-0.7  units/ml Monitor platelets by anticoagulation protocol: Yes   Plan:  INR subtherapeutic but getting closer to goal range. Likely still due to increase intake of spinach as an outpatient. Will give warfarin 15 mg PO x 1 dose tonight.    12/14 AM heparin level 0.67. Continue current regimen.  Recheck heparin level and CBC with tomorrow AM labs.  Fulton ReekMatt Chun Sellen, PharmD, BCPS  08/12/17 6:49 AM

## 2017-08-11 NOTE — Progress Notes (Signed)
Patient ID: Ryan Silversmithharles A Sealy, male   DOB: 1971/08/08, 46 y.o.   MRN: [id number]   [facility] PROGRESS NOTE  Ryan [name] JXB:[id number] DOB: 1971/08/08 DOA: 08/03/2017 PCP: Sherrie MustacheJadali, Fayegh, MD  HPI/Subjective: Patient feeling nervous about going home with his INR less than 2.  Cough seems a little bit better.  Feels okay.  No chest pain or shortness of breath.  Objective: Vitals:   08/11/17 0426 08/11/17 0749  BP: 131/83 131/70  Pulse: 86 88  Resp: 17   Temp: 98.2 F (36.8 C) 98.2 F (36.8 C)  SpO2: 91% 96%    Filed Weights   08/08/17 0500 08/10/17 0448 08/11/17 0426  Weight: (!) 157.2 kg (346 lb 8 oz) (!) 155.9 kg (343 lb 9.6 oz) (!) 155.8 kg (343 lb 6.4 oz)    ROS: Review of Systems  Constitutional: Negative for chills and fever.  Eyes: Negative for blurred vision.  Respiratory: Positive for cough. Negative for shortness of breath.   Cardiovascular: Negative for chest pain.  Gastrointestinal: Negative for abdominal pain, constipation, diarrhea, nausea and vomiting.  Genitourinary: Negative for dysuria.  Musculoskeletal: Negative for joint pain.  Neurological: Negative for dizziness and headaches.   Exam: Physical Exam  Constitutional: He is oriented to person, place, and time.  HENT:  Nose: No mucosal edema.  Mouth/Throat: No oropharyngeal exudate or posterior oropharyngeal edema.  Eyes: Conjunctivae, EOM and lids are normal. Pupils are equal, round, and reactive to light.  Neck: No JVD present. Carotid bruit is not present. No edema present. No thyroid mass and no thyromegaly present.  Cardiovascular: S1 normal and S2 normal. Exam reveals no gallop.  No murmur heard. Pulses:      Dorsalis pedis pulses are 1+ on the right side, and 1+ on the left side.  Respiratory: No respiratory distress. He has decreased breath sounds in the right lower field and the left lower field. He has no wheezes. He has no rhonchi. He has no rales.  GI: Soft. Bowel sounds are normal.  There is no tenderness.  Musculoskeletal:       Right ankle: He exhibits swelling.       Left ankle: He exhibits swelling.  Lymphadenopathy:    He has no cervical adenopathy.  Neurological: He is alert and oriented to person, place, and time. No cranial nerve deficit.  Skin: Skin is warm. No rash noted. Nails show no clubbing.  Psychiatric: He has a normal mood and affect.      Data Reviewed: Basic Metabolic Panel: Recent Labs  Lab 08/05/17 0507  NA 136  K 4.0  CL 104  CO2 23  GLUCOSE 107*  BUN 18  CREATININE 1.34*  CALCIUM 8.5*   CBC: Recent Labs  Lab 08/06/17 0546 08/08/17 0502 08/09/17 0456 08/10/17 0316 08/11/17 0448  WBC 4.1 4.1 4.7 4.9 5.0  HGB 14.3 14.2 14.6 14.5 14.9  HCT 42.9 42.4 43.9 44.2 45.2  MCV 92.0 91.0 91.1 92.5 91.3  PLT 302 289 323 324 323   Cardiac Enzymes: No results for input(s): CKTOTAL, CKMB, CKMBINDEX, TROPONINI in the last 168 hours. BNP (last 3 results) Recent Labs    11/22/16 0937 01/03/17 1119 08/03/17 1736  BNP 24.0 20.0 14.0    CBG: No results for input(s): GLUCAP in the last 168 hours.   Scheduled Meds: . fluticasone  2 spray Each Nare BID  . Influenza vac split quadrivalent PF  0.5 mL Intramuscular Tomorrow-1000  . ipratropium  2 spray Each Nare BID  .  lisinopril  10 mg Oral Daily  . multivitamin with minerals  1 tablet Oral Daily  . pneumococcal 23 valent vaccine  0.5 mL Intramuscular Tomorrow-1000  . protein supplement shake  11 oz Oral BID BM  . warfarin  15 mg Oral ONCE-1800  . Warfarin - Pharmacist Dosing Inpatient   Does not apply q1800   Continuous Infusions: . heparin 2,200 Units/hr (08/11/17 1037)    Assessment/Plan:  1. 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  Pulmonary infarct.  Patient came in with subtherapeutic INR.  Case discussed with pharmacy.  INR 1.87 today.  Coumadin 15 mg again tonight.  I am hoping his INR will be therapeutic tomorrow. 2. Cough secondary to postnasal drip start Flonase and  ipratropium nasal spray.  Chest x-ray repeated and is negative. 3. Essential hypertension on lisinopril 4. Morbid obesity weight loss needed   Code Status:     Code Status Orders  (From admission, onward)        Start     Ordered   08/03/17 2234  Full code  Continuous     08/03/17 2233    Code Status History    Date Active Date Inactive Code Status Order ID Comments User Context   01/03/2017 18:39 01/04/2017 21:56 Full Code [id number]  Houston SirenSainani, Vivek J, MD Inpatient   11/22/2016 16:51 11/24/2016 21:25 Full Code [id number]  [name], Qing, MD Inpatient   12/28/2014 23:45 01/15/2015 22:11 Full Code [id number]  Gasper SellsVickers, Pamela, RN Inpatient     Disposition Plan: Home once INR therapeutic  Time spent: 24 minutes  Deshanda Molitor [employer]  [facility]

## 2017-08-11 NOTE — Plan of Care (Signed)
  Progressing Education: Knowledge of General Education information will improve 08/11/2017 1237 - Progressing by Rigoberto NoelMorales, Airik Goodlin Y, RN Note Pt in room resting comfortably, no complaints of pain.  Health Behavior/Discharge Planning: Ability to manage health-related needs will improve 08/11/2017 1237 - Progressing by Rigoberto NoelMorales, Delois Tolbert Y, RN Clinical Measurements: Ability to maintain clinical measurements within normal limits will improve 08/11/2017 1237 - Progressing by Rigoberto NoelMorales, Normagene Harvie Y, RN

## 2017-08-11 NOTE — Plan of Care (Signed)
Patient has no acute event overnight. VSS, denied pain or being in discomfort. Heparin gtt infusing.

## 2017-08-12 LAB — CBC
HEMATOCRIT: 46.2 % (ref 40.0–52.0)
HEMOGLOBIN: 14.9 g/dL (ref 13.0–18.0)
MCH: 29.6 pg (ref 26.0–34.0)
MCHC: 32.3 g/dL (ref 32.0–36.0)
MCV: 91.8 fL (ref 80.0–100.0)
Platelets: 328 10*3/uL (ref 150–440)
RBC: 5.03 MIL/uL (ref 4.40–5.90)
RDW: 15.1 % — ABNORMAL HIGH (ref 11.5–14.5)
WBC: 4.6 10*3/uL (ref 3.8–10.6)

## 2017-08-12 LAB — PROTIME-INR
INR: 2.15
Prothrombin Time: 23.8 seconds — ABNORMAL HIGH (ref 11.4–15.2)

## 2017-08-12 LAB — HEPARIN LEVEL (UNFRACTIONATED): HEPARIN UNFRACTIONATED: 0.67 [IU]/mL (ref 0.30–0.70)

## 2017-08-12 MED ORDER — IPRATROPIUM BROMIDE 0.06 % NA SOLN: 2.0000 | Freq: Two times a day (BID) | NASAL | 0 refills | Status: DC

## 2017-08-12 MED ORDER — WARFARIN SODIUM 6 MG PO TABS: 12.0000 mg | ORAL_TABLET | Freq: Every day | ORAL | 0 refills | Status: DC

## 2017-08-12 MED ORDER — FLUTICASONE PROPIONATE 50 MCG/ACT NA SUSP: 2.0000 | Freq: Two times a day (BID) | NASAL | 0 refills | Status: DC

## 2017-08-12 MED ORDER — WARFARIN SODIUM 6 MG PO TABS: 12.0000 mg | ORAL_TABLET | Freq: Once | ORAL | Status: DC

## 2017-08-12 MED ORDER — PREMIER PROTEIN SHAKE: 11.0000 [oz_av] | Freq: Two times a day (BID) | ORAL | 0 refills | Status: DC

## 2017-08-12 NOTE — Discharge Summary (Signed)
[facility] - [place] [facility]   PATIENT NAME: Ryan [name]    MR#:  [id number]  DATE OF BIRTH:  Jul 12, 1971  DATE OF ADMISSION:  08/03/2017 ADMITTING PHYSICIAN: Oralia Manis, MD  DATE OF DISCHARGE: 08/12/2017 11:45 AM  PRIMARY CARE PHYSICIAN: Sherrie Mustache, MD    ADMISSION DIAGNOSIS:  Other acute pulmonary embolism with acute cor pulmonale (HCC) [I26.09]  DISCHARGE DIAGNOSIS:  Principal Problem:   Pulmonary emboli (HCC) Active Problems:   Chronic diastolic heart failure (HCC)   HTN (hypertension)   CAP (community acquired pneumonia)   CKD (chronic kidney disease), stage III (HCC)   SECONDARY DIAGNOSIS:   Past Medical History:  Diagnosis Date  . CHF (congestive heart failure) (HCC)   . Diabetes mellitus without complication (HCC)    per pt-pre diabetic-Dr Dario Guardian stated said pt  . DVT (deep venous thrombosis) (HCC)   . Hypertension   . Peripheral vascular disease (HCC)   . Presence of IVC filter     HOSPITAL COURSE:   1.  Extensive pulmonary embolism bilaterally, pulmonary infarct.  Likely this is acute on chronic pulmonary emboli.  The patient had right heart strain estimated on CT scan.  The patient came in with a subtherapeutic INR.  He was placed on heparin drip.  It took a while to get his INR up with Coumadin.  Upon discharge his INR is 2.15.  The patient stated he was taking 9 mg at home but he changed his diet and was eating a lot of spinach.  We discussed that he must eat a consistent amount of greens per week in order for [place] to adjust his Coumadin.  We kept on increasing his dose here in the hospital and we did give 15 mg in order to get his level up.  I discussed his case with the pharmacist and I will cut his dose down to 12 mg and recheck an INR on Monday with his PMD.  Further adjustments in Coumadin dosing will be made at this patient.  He was therapeutic upon discharge home.  I advised him he can eat the same amount of greens that he did  here in the hospital at home.  Risk of blood thinner explained to the patient. 2.  Cough secondary to postnasal drip.  Improved with Flonase and ipratropium nasal spray.  Repeat chest x-ray negative. 3.  Essential hypertension on lisinopril 4.  Morbid obesity weight loss needed 5.  Chronic kidney disease stage II 6.  No signs of congestive heart failure on this hospital stay.  DISCHARGE CONDITIONS:   Satisfactory  CONSULTS OBTAINED:  Treatment Team:  Marcina Millard, MD Mertie Moores, MD Annice Needy, MD  DRUG ALLERGIES:   Allergies  Allergen Reactions  . Clindamycin/Lincomycin Rash  . Sulfa Antibiotics Rash    DISCHARGE MEDICATIONS:   Allergies as of 08/12/2017      Reactions   Clindamycin/lincomycin Rash   Sulfa Antibiotics Rash      Medication List    STOP taking these medications   enoxaparin 150 MG/ML injection Commonly known as:  LOVENOX   hydrochlorothiazide 25 MG tablet Commonly known as:  HYDRODIURIL     TAKE these medications   fluticasone 50 MCG/ACT nasal spray Commonly known as:  FLONASE Place 2 sprays into both nostrils 2 (two) times daily.   ipratropium 0.06 % nasal spray Commonly known as:  ATROVENT Place 2 sprays into both nostrils 2 (two) times daily.   lisinopril 10 MG tablet Commonly known  as:  PRINIVIL,ZESTRIL Take 1 tablet (10 mg total) by mouth daily.   protein supplement shake Liqd Commonly known as:  PREMIER PROTEIN Take 325 mLs (11 oz total) by mouth 2 (two) times daily between meals.   Vitamin D (Ergocalciferol) 50000 units Caps capsule Commonly known as:  DRISDOL Take 50,000 Units by mouth every 7 (seven) days.   warfarin 6 MG tablet Commonly known as:  COUMADIN Take 2 tablets (12 mg total) by mouth daily at 6 PM. What changed:    medication strength  how much to take  when to take this        DISCHARGE INSTRUCTIONS:    Follow-up with PMD on Monday for an INR check  If you experience worsening of  your admission symptoms, develop shortness of breath, life threatening emergency, suicidal or homicidal thoughts you must seek medical attention immediately by calling 911 or calling your MD immediately  if symptoms less severe.  You Must read complete instructions/literature along with all the possible adverse reactions/side effects for all the Medicines you take and that have been prescribed to you. Take any new Medicines after you have completely understood and accept all the possible adverse reactions/side effects.   Please note  You were cared for by a hospitalist during your hospital stay. If you have any questions about your discharge medications or the care you received while you were in the hospital after you are discharged, you can call the unit and asked to speak with the hospitalist on call if the hospitalist that took care of you is not available. Once you are discharged, your primary care physician will handle any further medical issues. Please note that NO REFILLS for any discharge medications will be authorized once you are discharged, as it is imperative that you return to your primary care physician (or establish a relationship with a primary care physician if you do not have one) for your aftercare needs so that they can reassess your need for medications and monitor your lab values.    Today   CHIEF COMPLAINT:   Chief Complaint  Patient presents with  . Chest Pain  . Shortness of Breath    HISTORY OF PRESENT ILLNESS:  Ryan [name]  is a 46 y.o. male came in with chest pain and shortness of breath.  VITAL SIGNS:  Blood pressure (!) 114/50, pulse (!) 108, temperature 98.2 F (36.8 C), temperature source Oral, resp. rate 18, height 5\' 7"  (1.702 m), weight (!) 155.1 kg (341 lb 14.4 oz), SpO2 97 %.    PHYSICAL EXAMINATION:  GENERAL:  46 y.o.-year-old patient lying in the bed with no acute distress.  EYES: Pupils equal, round, reactive to light and accommodation. No  scleral icterus. Extraocular muscles intact.  HEENT: Head atraumatic, normocephalic. Oropharynx and nasopharynx clear.  NECK:  Supple, no jugular venous distention. No thyroid enlargement, no tenderness.  LUNGS: Normal breath sounds bilaterally, no wheezing, rales,rhonchi or crepitation. No use of accessory muscles of respiration.  CARDIOVASCULAR: S1, S2 normal. No murmurs, rubs, or gallops.  ABDOMEN: Soft, non-tender, non-distended. Bowel sounds present. No organomegaly or mass.  EXTREMITIES: 3+ edema, no cyanosis, or clubbing.  NEUROLOGIC: Cranial nerves II through XII are intact. Muscle strength 5/5 in all extremities. Sensation intact. Gait not checked.  PSYCHIATRIC: The patient is alert and oriented x 3.  SKIN: No obvious rash, lesion, or ulcer.   DATA REVIEW:   CBC Recent Labs  Lab 08/12/17 0511  WBC 4.6  HGB 14.9  HCT 46.2  PLT 328    Chemistries  Recent Labs  Lab 08/09/17 0456  AST 43*  ALT 56  ALKPHOS 51  BILITOT 0.5    Management plans discussed with the patient, and he is in agreement.  CODE STATUS:     Code Status Orders  (From admission, onward)        Start     Ordered   08/03/17 2234  Full code  Continuous     08/03/17 2233    Code Status History    Date Active Date Inactive Code Status Order ID Comments User Context   01/03/2017 18:39 01/04/2017 21:56 Full Code [id number]  Houston SirenSainani, Vivek J, MD Inpatient   11/22/2016 16:51 11/24/2016 21:25 Full Code [id number]  [name], Qing, MD Inpatient   12/28/2014 23:45 01/15/2015 22:11 Full Code [id number]  Gasper SellsVickers, Pamela, RN Inpatient      TOTAL TIME TAKING CARE OF THIS PATIENT: 35 minutes.    Alford Highlandichard Kwamane Whack M.D on 08/12/2017 at 2:12 PM  Between 7am to 6pm - Pager - [phone]  After 6pm go to [email or web address] - password [email or web address]  [facility] Office  [phone]  CC: Primary care physician; Sherrie MustacheJadali, Fayegh, MD

## 2017-08-12 NOTE — Discharge Instructions (Signed)

## 2017-08-12 NOTE — Progress Notes (Signed)
ANTICOAGULATION CONSULT NOTE - follow up  Pharmacy Consult for heparin drip and warfarin Indication: pulmonary embolus  Allergies  Allergen Reactions  . Clindamycin/Lincomycin Rash  . Sulfa Antibiotics Rash    Patient Measurements: Height: 5\' 7"  (170.2 cm) Weight: (!) 341 lb 14.4 oz (155.1 kg) IBW/kg (Calculated) : 66.1 Heparin Dosing Weight: 105 kg   Vital Signs: Temp: 97.8 F (36.6 C) (12/14 0420) Temp Source: Oral (12/14 0420) BP: 116/69 (12/14 0423) Pulse Rate: 92 (12/14 0423)  Labs: Recent Labs    08/10/17 0316 08/11/17 0448 08/11/17 1019 08/12/17 0511  HGB 14.5 14.9  --  14.9  HCT 44.2 45.2  --  46.2  PLT 324 323  --  328  LABPROT 18.9* 20.6* 21.4* 23.8*  INR 1.60 1.79 1.87 2.15  HEPARINUNFRC 0.52 0.61  --  0.67    Estimated Creatinine Clearance: 98 mL/min (A) (by C-G formula based on SCr of 1.34 mg/dL (H)).   Medical History: Past Medical History:  Diagnosis Date  . CHF (congestive heart failure) (HCC)   . Diabetes mellitus without complication (HCC)    per pt-pre diabetic-Dr Dario GuardianJadali stated said pt  . DVT (deep venous thrombosis) (HCC)   . Hypertension   . Peripheral vascular disease (HCC)   . Presence of IVC filter     Assessment: 46 year old male with large PE, presents subtherapeutic on warfarin 9mg  daily (per patient). INR 1.2.   RPh spoke with patient regarding warfarin on 12/7 and patient reports a recent change in his diet with an increase in the amount of greens and vitamin K containing foods. Discussed importance of a consistent diet, INR monitoring, and advised patient to report changes in diet to clinic for INR monitoring.  Warfarin Trend: 12/7    INR           Warfarin 10mg  12/8    INR 1.14   Warfarin 10mg  12/9    INR 1.15   Warfarin 12mg  12/10  INR 1.19   12 mg 12/11  INR 1.32   15 mg  12/12  INR 1.60   15 mg  12/13 INR  1.79   15 mg 12/14 INR: 2.15   Heparin Infusion: Heparin infusion running at 2200 units/hr and remains in  therapeutic range.  Goal of Therapy:  Heparin level 0.3-0.7 units/ml Monitor platelets by anticoagulation protocol: Yes   Plan:  12/14 AM heparin level 0.67. Continue current regimen.  Recheck heparin level and CBC with tomorrow AM labs.  INR finally therapeutic. Will give warfarin 12 mg PO x 1 dose.  Demetrius Charityeldrin D. Sacheen Arrasmith, PharmD Clinical Pharmacist  08/12/17 7:50 AM

## 2017-08-12 NOTE — Care Management (Signed)
No discharge needs identified by members of the care team 

## 2017-11-02 ENCOUNTER — Other Ambulatory Visit: Payer: Self-pay | Admitting: Neurology

## 2017-11-02 ENCOUNTER — Other Ambulatory Visit (HOSPITAL_COMMUNITY): Payer: Self-pay | Admitting: Neurology

## 2017-11-02 DIAGNOSIS — R29898 Other symptoms and signs involving the musculoskeletal system: Secondary | ICD-10-CM

## 2017-11-07 ENCOUNTER — Other Ambulatory Visit: Payer: Self-pay | Admitting: Neurology

## 2017-11-07 DIAGNOSIS — R29898 Other symptoms and signs involving the musculoskeletal system: Secondary | ICD-10-CM

## 2017-11-17 ENCOUNTER — Other Ambulatory Visit: Payer: Self-pay | Admitting: Neurology

## 2017-11-17 DIAGNOSIS — R29898 Other symptoms and signs involving the musculoskeletal system: Secondary | ICD-10-CM

## 2017-12-01 ENCOUNTER — Ambulatory Visit
Admission: RE | Admit: 2017-12-01 | Discharge: 2017-12-01 | Disposition: A | Discharge status: Home / Self Care | Payer: 59 | Source: Ambulatory Visit | Attending: Neurology | Admitting: Neurology

## 2017-12-01 DIAGNOSIS — M5136 Other intervertebral disc degeneration, lumbar region: Secondary | ICD-10-CM | POA: Diagnosis not present

## 2017-12-01 DIAGNOSIS — R29898 Other symptoms and signs involving the musculoskeletal system: Secondary | ICD-10-CM

## 2017-12-01 DIAGNOSIS — R937 Abnormal findings on diagnostic imaging of other parts of musculoskeletal system: Secondary | ICD-10-CM | POA: Insufficient documentation

## 2017-12-01 DIAGNOSIS — R531 Weakness: Secondary | ICD-10-CM | POA: Insufficient documentation

## 2018-03-04 IMAGING — CT CT ANGIO CHEST
2 of 6 series · 17 of 46 positions shown · IV contrast (APPLIED)
Comparison: December 20, 2014

ADDENDUM:
Positive for acute PE with CTevidence of right heart strain (RV/LV
Ratio = Positive for acute PE with CT evidence of right heart strain
(RV/LV Ratio = 1.333) consistent with at least submassive
(intermediate risk) PE. The presence of right heart strain has been
associated with an increased risk of morbidity and mortality. Please
activate Code PE by paging [phone].) consistent with at least
submassive (intermediate risk) PE. The presence of right heart
strain has been associated with an increased risk of morbidity and
mortality.
CLINICAL DATA: Shortness of breath for 8 weeks. Previous history of
the venous thrombosis and pulmonary embolus.

EXAM:
CT ANGIOGRAPHY CHEST WITH CONTRAST
TECHNIQUE: Multidetector CT imaging of the chest was performed using the
standard protocol during bolus administration of intravenous
contrast. Multiplanar CT image reconstructions and MIPs were
obtained to evaluate the vascular anatomy.
CONTRAST:  75 mL of Isovue 370

[Series 5: thins · axial · 0.81mm/px · z∈[-346,-73]mm · 15 of 299 slices shown]
[im 13/299  lung]
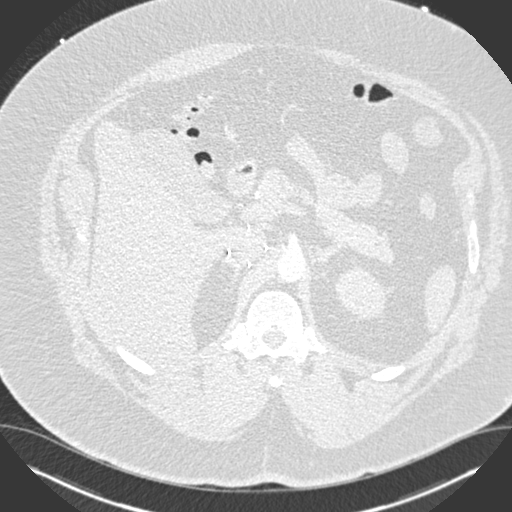
[im 39/299  soft-tissue]
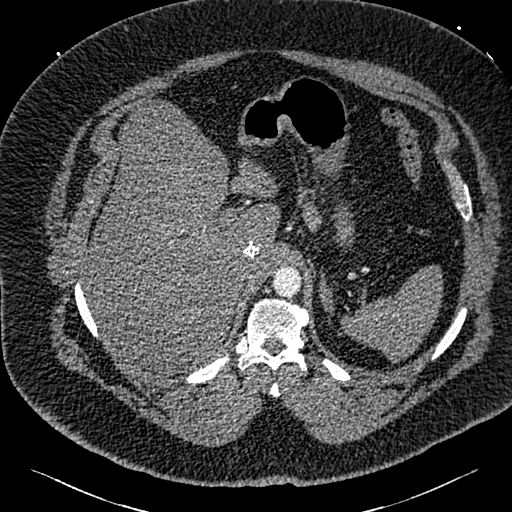
[im 52/299  lung]
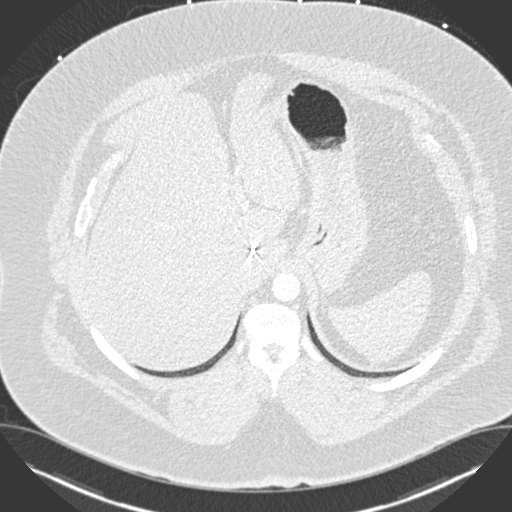
[im 78/299  soft-tissue]
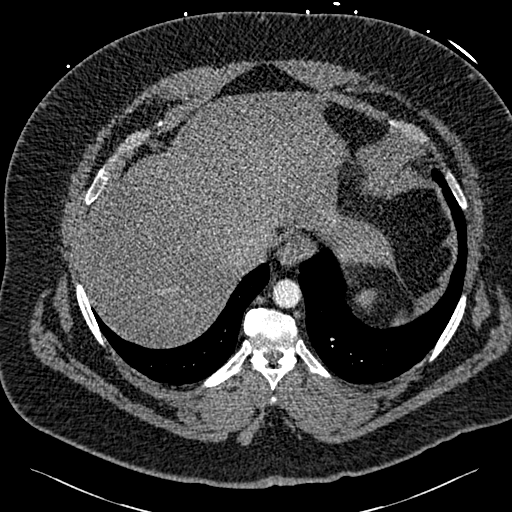
[im 91/299  lung]
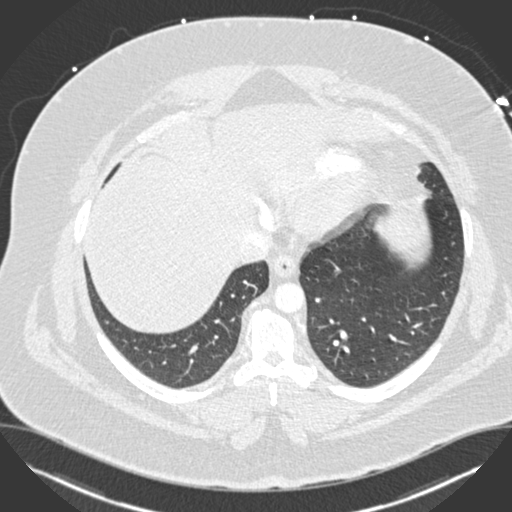
[im 117/299  soft-tissue]
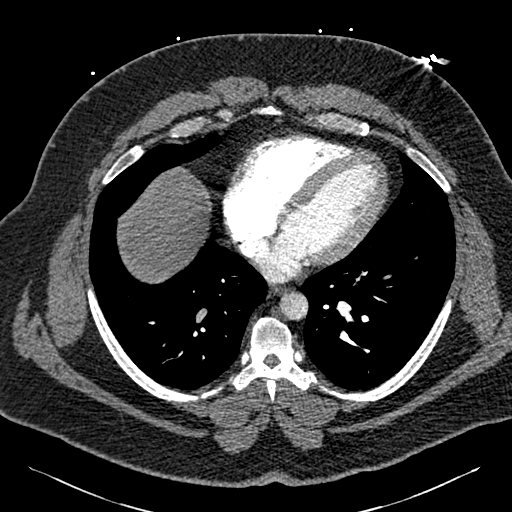
[im 130/299  lung]
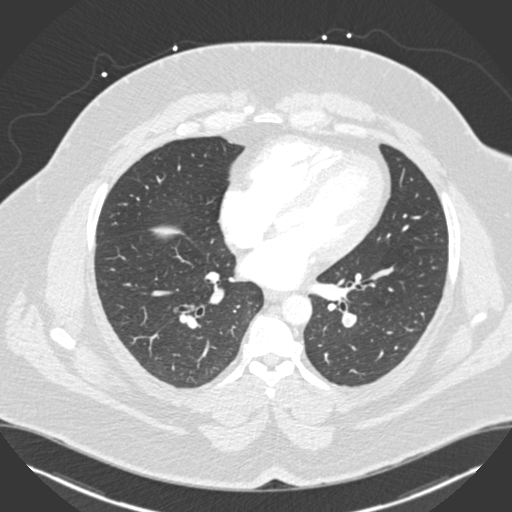
[im 156/299  soft-tissue]
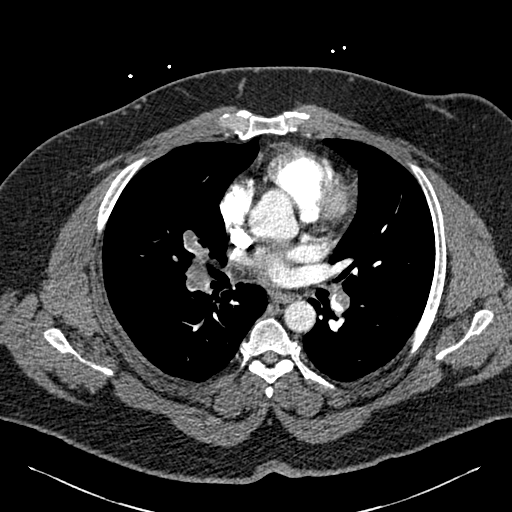
[im 169/299  lung]
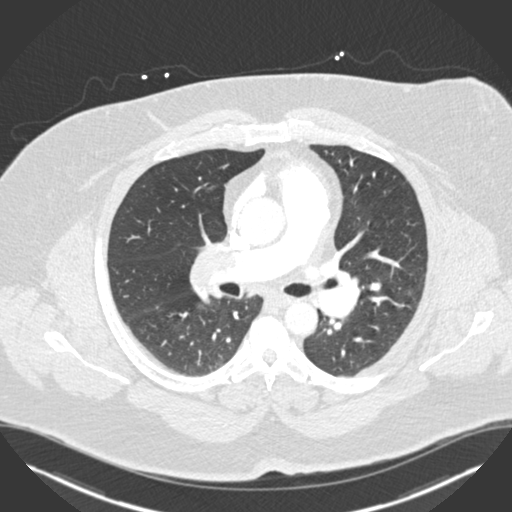
[im 182/299  soft-tissue]
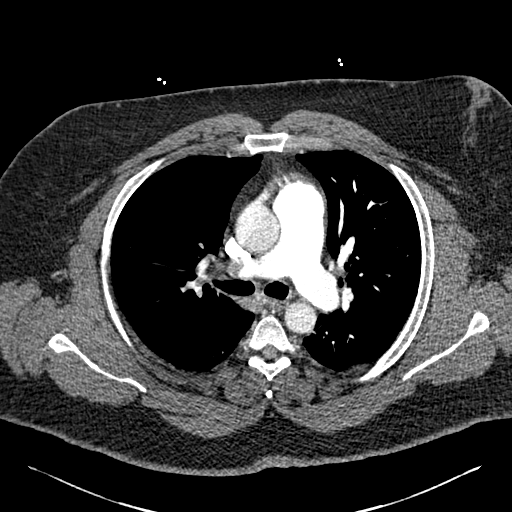
[im 208/299  lung]
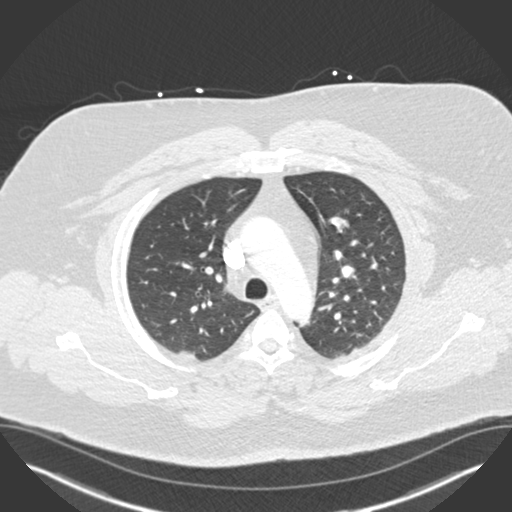
[im 221/299  soft-tissue]
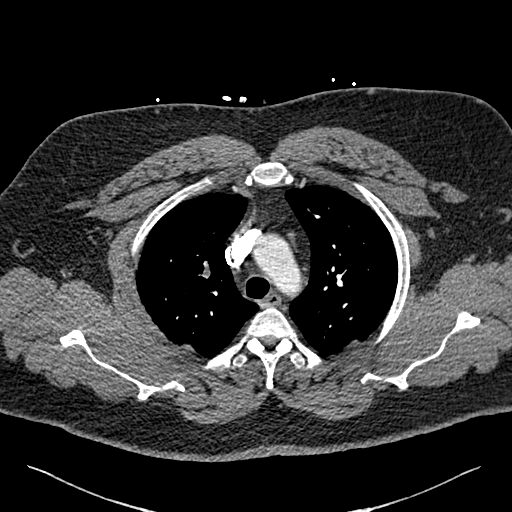
[im 247/299  lung]
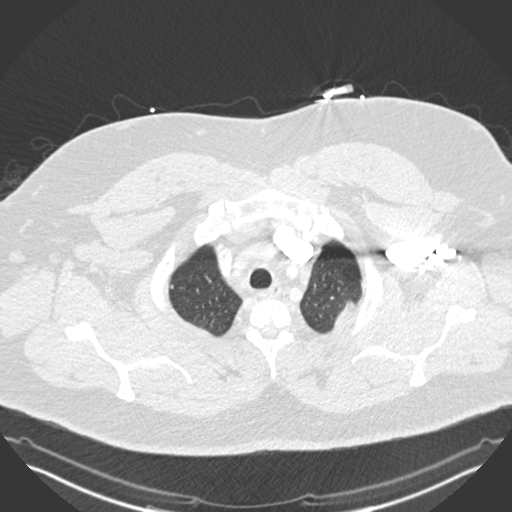
[im 260/299  soft-tissue]
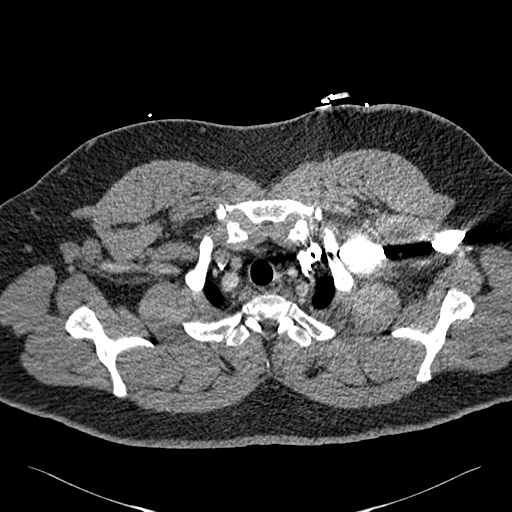
[im 286/299  lung]
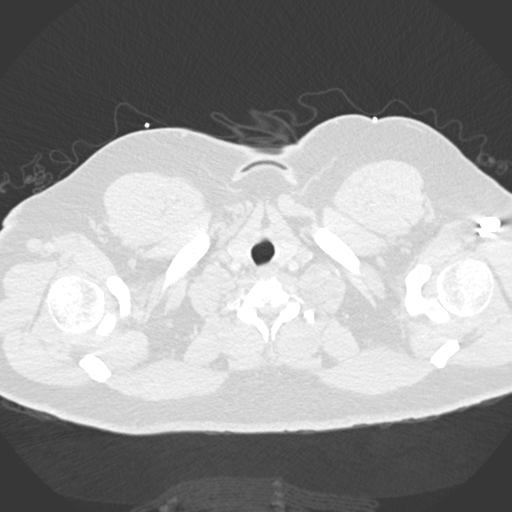

[Series 7: coronal mpr · coronal · 0.61mm/px · 2 of 108 slices shown]
[im 36/108  soft-tissue]
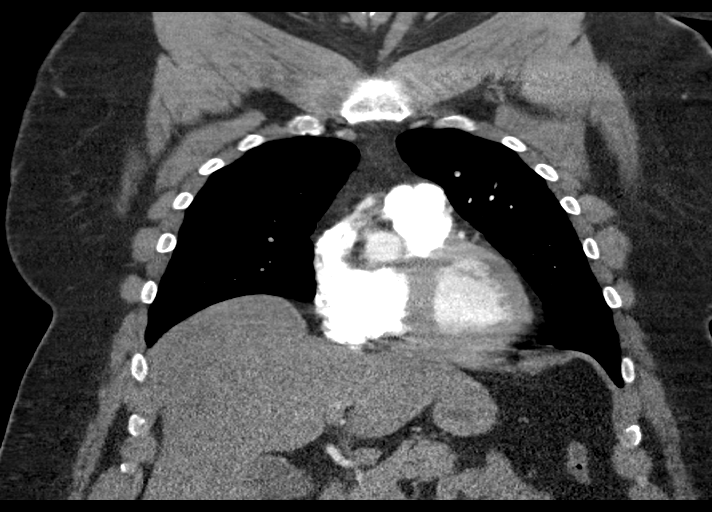
[im 72/108  soft-tissue]
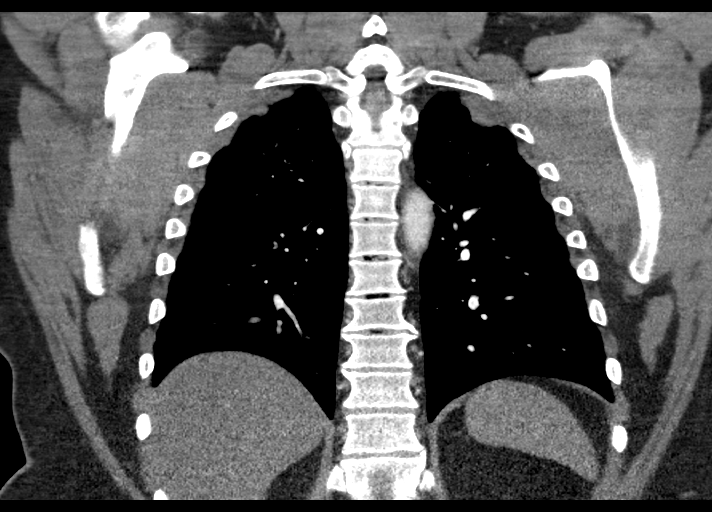

[17 of 46 positions shown; findings below may reference images not displayed]

FINDINGS: Cardiovascular: There is a large pulmonary embolus in the right main
pulmonary artery with acute pulmonary embolus involving bilateral
multiple segmental and subsegmental pulmonary arteries of all lobes
of bilateral lungs. The aorta is normal appearing. The heart size is
normal. There is no pericardial effusion.

Mediastinum/Nodes: No enlarged mediastinal, hilar, or axillary lymph
nodes. Thyroid gland, trachea, and esophagus demonstrate no
significant findings.

Lungs/Pleura: No enlarged mediastinal, hilar, or axillary lymph
nodes. Thyroid gland, trachea, and esophagus demonstrate no
significant findings.

Upper Abdomen: IVC filter is identified. There is diffuse fatty
infiltration of liver. The other visualized upper abdominal
structures are unremarkable.

Musculoskeletal: No chest wall abnormality. No acute or significant
osseous findings.

Review of the MIP images confirms the above findings.
IMPRESSION: Large pulmonary embolus in the right main pulmonary artery with
acute pulmonary embolus involving bilateral multiple segmental and
subsegmental pulmonary arteries of all lobes of bilateral lungs.

These results will be called to the ordering clinician or
representative by the Radiologist Assistant, and communication
documented in the PACS or zVision Dashboard.

## 2020-01-24 ENCOUNTER — Encounter: Payer: Self-pay | Admitting: Emergency Medicine

## 2020-01-24 ENCOUNTER — Inpatient Hospital Stay
Admission: EM | Admit: 2020-01-24 | Discharge: 2020-01-27 | DRG: 299 | Disposition: A | Discharge status: Home / Self Care | Payer: No Typology Code available for payment source | Attending: Hospitalist | Admitting: Hospitalist

## 2020-01-24 ENCOUNTER — Emergency Department: Payer: No Typology Code available for payment source

## 2020-01-24 ENCOUNTER — Other Ambulatory Visit: Payer: Self-pay

## 2020-01-24 DIAGNOSIS — N183 Chronic kidney disease, stage 3 unspecified: Secondary | ICD-10-CM

## 2020-01-24 DIAGNOSIS — Z7901 Long term (current) use of anticoagulants: Secondary | ICD-10-CM

## 2020-01-24 DIAGNOSIS — I5032 Chronic diastolic (congestive) heart failure: Secondary | ICD-10-CM | POA: Diagnosis present

## 2020-01-24 DIAGNOSIS — Z6841 Body Mass Index (BMI) 40.0 and over, adult: Secondary | ICD-10-CM | POA: Diagnosis not present

## 2020-01-24 DIAGNOSIS — Z20822 Contact with and (suspected) exposure to covid-19: Secondary | ICD-10-CM | POA: Diagnosis present

## 2020-01-24 DIAGNOSIS — R0602 Shortness of breath: Secondary | ICD-10-CM

## 2020-01-24 DIAGNOSIS — I82413 Acute embolism and thrombosis of femoral vein, bilateral: Principal | ICD-10-CM | POA: Diagnosis present

## 2020-01-24 DIAGNOSIS — E1122 Type 2 diabetes mellitus with diabetic chronic kidney disease: Secondary | ICD-10-CM | POA: Diagnosis present

## 2020-01-24 DIAGNOSIS — Z9114 Patient's other noncompliance with medication regimen: Secondary | ICD-10-CM | POA: Diagnosis not present

## 2020-01-24 DIAGNOSIS — Z86711 Personal history of pulmonary embolism: Secondary | ICD-10-CM | POA: Diagnosis not present

## 2020-01-24 DIAGNOSIS — I130 Hypertensive heart and chronic kidney disease with heart failure and stage 1 through stage 4 chronic kidney disease, or unspecified chronic kidney disease: Secondary | ICD-10-CM | POA: Diagnosis present

## 2020-01-24 DIAGNOSIS — E1151 Type 2 diabetes mellitus with diabetic peripheral angiopathy without gangrene: Secondary | ICD-10-CM | POA: Diagnosis present

## 2020-01-24 DIAGNOSIS — I82433 Acute embolism and thrombosis of popliteal vein, bilateral: Secondary | ICD-10-CM | POA: Diagnosis present

## 2020-01-24 DIAGNOSIS — I82409 Acute embolism and thrombosis of unspecified deep veins of unspecified lower extremity: Secondary | ICD-10-CM | POA: Diagnosis present

## 2020-01-24 DIAGNOSIS — I2699 Other pulmonary embolism without acute cor pulmonale: Secondary | ICD-10-CM

## 2020-01-24 DIAGNOSIS — Z79899 Other long term (current) drug therapy: Secondary | ICD-10-CM | POA: Diagnosis not present

## 2020-01-24 DIAGNOSIS — I825Z3 Chronic embolism and thrombosis of unspecified deep veins of distal lower extremity, bilateral: Secondary | ICD-10-CM

## 2020-01-24 DIAGNOSIS — I824Y3 Acute embolism and thrombosis of unspecified deep veins of proximal lower extremity, bilateral: Secondary | ICD-10-CM

## 2020-01-24 DIAGNOSIS — I5033 Acute on chronic diastolic (congestive) heart failure: Secondary | ICD-10-CM | POA: Diagnosis present

## 2020-01-24 DIAGNOSIS — I5031 Acute diastolic (congestive) heart failure: Secondary | ICD-10-CM | POA: Diagnosis present

## 2020-01-24 DIAGNOSIS — M7989 Other specified soft tissue disorders: Secondary | ICD-10-CM | POA: Diagnosis not present

## 2020-01-24 DIAGNOSIS — Z86718 Personal history of other venous thrombosis and embolism: Secondary | ICD-10-CM

## 2020-01-24 DIAGNOSIS — E66813 Obesity, class 3: Secondary | ICD-10-CM | POA: Diagnosis present

## 2020-01-24 DIAGNOSIS — I10 Essential (primary) hypertension: Secondary | ICD-10-CM | POA: Diagnosis present

## 2020-01-24 LAB — BASIC METABOLIC PANEL
Anion gap: 11 (ref 5–15)
BUN: 20 mg/dL (ref 6–20)
CO2: 25 mmol/L (ref 22–32)
Calcium: 8.9 mg/dL (ref 8.9–10.3)
Chloride: 101 mmol/L (ref 98–111)
Creatinine, Ser: 1.7 mg/dL — ABNORMAL HIGH (ref 0.61–1.24)
GFR calc Af Amer: 53 mL/min — ABNORMAL LOW (ref >60)
GFR calc non Af Amer: 46 mL/min — ABNORMAL LOW (ref >60)
Glucose, Bld: 123 mg/dL — ABNORMAL HIGH (ref 70–99)
Potassium: 4.1 mmol/L (ref 3.5–5.1)
Sodium: 137 mmol/L (ref 135–145)

## 2020-01-24 LAB — CBC
HCT: 47.8 % (ref 39.0–52.0)
Hemoglobin: 15.4 g/dL (ref 13.0–17.0)
MCH: 30.2 pg (ref 26.0–34.0)
MCHC: 32.2 g/dL (ref 30.0–36.0)
MCV: 93.7 fL (ref 80.0–100.0)
Platelets: 169 10*3/uL (ref 150–400)
RBC: 5.1 MIL/uL (ref 4.22–5.81)
RDW: 14.3 % (ref 11.5–15.5)
WBC: 6.9 10*3/uL (ref 4.0–10.5)
nRBC: 0 % (ref 0.0–0.2)

## 2020-01-24 LAB — BRAIN NATRIURETIC PEPTIDE: B Natriuretic Peptide: 14.1 pg/mL (ref 0.0–100.0)

## 2020-01-24 LAB — SARS CORONAVIRUS 2 BY RT PCR (HOSPITAL ORDER, PERFORMED IN ~~LOC~~ HOSPITAL LAB): SARS Coronavirus 2: NEGATIVE

## 2020-01-24 LAB — HEPATIC FUNCTION PANEL
ALT: 21 U/L (ref 0–44)
AST: 16 U/L (ref 15–41)
Albumin: 4.4 g/dL (ref 3.5–5.0)
Alkaline Phosphatase: 64 U/L (ref 38–126)
Bilirubin, Direct: 0.2 mg/dL (ref 0.0–0.2)
Indirect Bilirubin: 1.2 mg/dL — ABNORMAL HIGH (ref 0.3–0.9)
Total Bilirubin: 1.4 mg/dL — ABNORMAL HIGH (ref 0.3–1.2)
Total Protein: 8.6 g/dL — ABNORMAL HIGH (ref 6.5–8.1)

## 2020-01-24 LAB — PROTIME-INR
INR: 1.2 (ref 0.8–1.2)
Prothrombin Time: 14.6 seconds (ref 11.4–15.2)

## 2020-01-24 LAB — HEPARIN LEVEL (UNFRACTIONATED)
Heparin Unfractionated: <0.1 IU/mL — ABNORMAL LOW (ref 0.30–0.70)
Heparin Unfractionated: 0.36 IU/mL (ref 0.30–0.70)

## 2020-01-24 LAB — APTT: aPTT: 27 seconds (ref 24–36)

## 2020-01-24 LAB — TROPONIN I (HIGH SENSITIVITY): Troponin I (High Sensitivity): 7 ng/L (ref <18)

## 2020-01-24 MED ORDER — PANTOPRAZOLE SODIUM 40 MG PO TBEC
40.0000 mg | DELAYED_RELEASE_TABLET | Freq: Every day | ORAL | Status: DC
  Administered 2020-01-24 – 2020-01-27 (×4): 40 mg via ORAL
  Filled 2020-01-24 (×4): qty 1

## 2020-01-24 MED ORDER — ATORVASTATIN CALCIUM 10 MG PO TABS
10.0000 mg | ORAL_TABLET | Freq: Every day | ORAL | Status: DC
  Administered 2020-01-24 – 2020-01-27 (×4): 10 mg via ORAL
  Filled 2020-01-24 (×4): qty 1

## 2020-01-24 MED ORDER — HEPARIN BOLUS VIA INFUSION
6000.0000 [IU] | Freq: Once | INTRAVENOUS | Status: AC
  Administered 2020-01-24: 6000 [IU] via INTRAVENOUS
  Filled 2020-01-24: qty 6000

## 2020-01-24 MED ORDER — ONDANSETRON HCL 4 MG/2ML IJ SOLN: 4.0000 mg | Freq: Four times a day (QID) | INTRAMUSCULAR | Status: DC | PRN

## 2020-01-24 MED ORDER — FLUTICASONE PROPIONATE 50 MCG/ACT NA SUSP
2.0000 | Freq: Two times a day (BID) | NASAL | Status: DC
  Filled 2020-01-24: qty 16

## 2020-01-24 MED ORDER — HEPARIN (PORCINE) 25000 UT/250ML-% IV SOLN
INTRAVENOUS | Status: AC
  Administered 2020-01-24: 1400 [IU]/h
  Filled 2020-01-24: qty 250

## 2020-01-24 MED ORDER — HYDRALAZINE HCL 20 MG/ML IJ SOLN
10.0000 mg | Freq: Four times a day (QID) | INTRAMUSCULAR | Status: DC | PRN
  Administered 2020-01-24 (×2): 10 mg via INTRAVENOUS
  Filled 2020-01-24 (×2): qty 1

## 2020-01-24 MED ORDER — IOHEXOL 350 MG/ML SOLN
75.0000 mL | Freq: Once | INTRAVENOUS | Status: AC | PRN
  Administered 2020-01-24: 75 mL via INTRAVENOUS

## 2020-01-24 MED ORDER — ADULT MULTIVITAMIN W/MINERALS CH
1.0000 | ORAL_TABLET | Freq: Every day | ORAL | Status: DC
  Administered 2020-01-24 – 2020-01-27 (×4): 1 via ORAL
  Filled 2020-01-24 (×4): qty 1

## 2020-01-24 MED ORDER — METOPROLOL TARTRATE 25 MG PO TABS
25.0000 mg | ORAL_TABLET | Freq: Two times a day (BID) | ORAL | Status: DC
  Administered 2020-01-24 – 2020-01-27 (×7): 25 mg via ORAL
  Filled 2020-01-24 (×7): qty 1

## 2020-01-24 MED ORDER — HYDROCHLOROTHIAZIDE 25 MG PO TABS
25.0000 mg | ORAL_TABLET | Freq: Every day | ORAL | Status: DC
  Administered 2020-01-24 – 2020-01-27 (×4): 25 mg via ORAL
  Filled 2020-01-24 (×4): qty 1

## 2020-01-24 MED ORDER — HEPARIN (PORCINE) 25000 UT/250ML-% IV SOLN
1500.0000 [IU]/h | INTRAVENOUS | Status: DC
  Administered 2020-01-25: 1500 [IU]/h via INTRAVENOUS
  Filled 2020-01-24 (×2): qty 250

## 2020-01-24 MED ORDER — POLYETHYLENE GLYCOL 3350 17 GM/SCOOP PO POWD
1.0000 | Freq: Once | ORAL | Status: DC
  Filled 2020-01-24: qty 255

## 2020-01-24 MED ORDER — WARFARIN - PHYSICIAN DOSING INPATIENT
Freq: Every day | Status: DC
  Filled 2020-01-24: qty 1

## 2020-01-24 MED ORDER — TRAMADOL HCL 50 MG PO TABS
50.0000 mg | ORAL_TABLET | Freq: Four times a day (QID) | ORAL | Status: DC | PRN
  Administered 2020-01-24 – 2020-01-26 (×6): 50 mg via ORAL
  Filled 2020-01-24 (×7): qty 1

## 2020-01-24 MED ORDER — ONDANSETRON HCL 4 MG PO TABS: 4.0000 mg | ORAL_TABLET | Freq: Four times a day (QID) | ORAL | Status: DC | PRN

## 2020-01-24 MED ORDER — CALCIUM ACETATE (PHOS BINDER) 667 MG PO CAPS
667.0000 mg | ORAL_CAPSULE | Freq: Three times a day (TID) | ORAL | Status: DC
  Filled 2020-01-24: qty 1

## 2020-01-24 MED ORDER — WARFARIN SODIUM 6 MG PO TABS
12.0000 mg | ORAL_TABLET | Freq: Every day | ORAL | Status: DC
  Administered 2020-01-24 – 2020-01-25 (×2): 12 mg via ORAL
  Filled 2020-01-24 (×2): qty 2

## 2020-01-24 NOTE — H&P (Addendum)
History and Physical    Ryan [name] UXL:[id number] DOB: [birth year] DOA: 01/24/2020  PCP: Sherrie Mustache, MD   Patient coming from: Home  I have personally briefly reviewed patient's old medical records in [facility]  Chief Complaint: Shortness of breath                               Pain in the right thigh  HPI: Ryan [name] is a 49 y.o. 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  He is also status post IVC filter insertion.  He presents to the emergency room for evaluation of shortness of breath with exertion for 2 days as well as pain in his right thigh extending to the pelvic area.  He states that the shortness of breath has been constant and severe with no relieving or aggravating factors.  It is also associated with bilateral lower extremity swelling.  He denies having any fever or chills, no cough, no chest pain, no dizziness or lightheadedness.  He denies having abdominal pain or changes in his bowel habits. Patient has not taken his Coumadin in a month because he states that his primary care provider refused to give him any more refills because he has not been going to the office to get his INR checked. Patient had a CT angiogram of the chest which showed there has been an increase in pulmonary embolus burden in the lower lobe pulmonary arteries on the right compared to the previous study. While these changes could represent chronic pulmonary embolus which occurred in the interval between the 2018 study and current study, acute pulmonary embolus superimposed on chronic pulmonary embolus must be of concern in the right lower lobe pulmonary artery branches given the change from the most recent available study. Patient had bilateral lower extremity DVT study done which showed acute on chronic, predominantly occlusive DVT extending from  the right common femoral vein through the right popliteal vein, progressed compared to the 2018 examination. Occlusive DVT involving the distal aspects of both paired left femoral veins extending to involve the left popliteal vein - while the clot burden within the left lower extremity is improved compared to the 2018 examination, in the absence of more recent prior examinations, an acute on chronic process is not excluded.  ED Course: Patient with a known history of venous thromboembolic disease who supposed to be on chronic anticoagulation therapy but has been noncompliant and presents for evaluation of shortness of breath associated with bilateral lower extremity swelling. Imaging is suggestive of acute on chronic DVT and PE. Patient has been started on a heparin bridge and will be admitted to the hospital.  Review of Systems: As per HPI otherwise 10 point review of systems negative.    Past Medical History:  Diagnosis Date  . CHF (congestive heart failure) (HCC)   . Diabetes mellitus without complication (HCC)    per pt-pre diabetic-Dr Dario Guardian stated said pt  . DVT (deep venous thrombosis) (HCC)   . Hypertension   . Peripheral vascular disease (HCC)   . Presence of IVC filter     Past Surgical History:  Procedure Laterality Date  . PERIPHERAL VASCULAR CATHETERIZATION N/A 01/10/2015   Procedure: Dialysis/Perma Catheter Insertion;  Surgeon: Renford Dills, MD;  Location: [facility] INVASIVE CV LAB;  Service: Cardiovascular;  Laterality:  N/A;  . PERIPHERAL VASCULAR CATHETERIZATION N/A 02/24/2015   Procedure: Dialysis/Perma Catheter Removal;  Surgeon: Annice Needy, MD;  Location: [facility] INVASIVE CV LAB;  Service: Cardiovascular;  Laterality: N/A;     reports that he has never smoked. He has never used smokeless tobacco. He reports that he does not drink alcohol or use drugs.  Allergies  Allergen Reactions  . Clindamycin/Lincomycin Rash  . Sulfa Antibiotics Rash    Family History    Problem Relation Age of Onset  . Hypertension Mother   . Cancer Mother   . Breast cancer Mother   . Prostate cancer Father   . Hypertension Father   . Diabetes Father      Prior to Admission medications   Medication Sig Start Date End Date Taking? Authorizing Provider  atorvastatin (LIPITOR) 10 MG tablet atorvastatin 10 mg tablet  TK 1 T PO QD   Yes [provider]  b complex vitamins capsule Take by mouth.   Yes [provider]  warfarin (COUMADIN) 6 MG tablet Take 2 tablets (12 mg total) by mouth daily at 6 PM. 08/12/17  Yes Wieting, Richard, MD  benzonatate (TESSALON) 100 MG capsule benzonatate 100 mg capsule  TK 1 C PO TID    [provider]  calcium acetate (PHOSLO) 667 MG capsule calcium acetate(phosphate binders) 667 mg capsule    [provider]  Docusate Sodium (DSS) 100 MG CAPS Doc-Q-Lace 100 mg capsule  TK 1 C PO BID PRN    [provider]  fluticasone (FLONASE) 50 MCG/ACT nasal spray Place 2 sprays into both nostrils 2 (two) times daily. Patient not taking: Reported on 01/24/2020 08/12/17   Alford Highland, MD  hydrochlorothiazide (HYDRODIURIL) 25 MG tablet hydrochlorothiazide 25 mg tablet 11/24/16   [provider]  metoprolol tartrate (LOPRESSOR) 25 MG tablet metoprolol tartrate 25 mg tablet  TK 1 T PO  BID FOR BP    [provider]  ondansetron (ZOFRAN) 4 MG tablet ondansetron HCl 4 mg tablet  TK 1 T PO Q 8 H    [provider]  ondansetron (ZOFRAN) 8 MG tablet ondansetron HCl 8 mg tablet    [provider]  pantoprazole (PROTONIX) 40 MG tablet pantoprazole 40 mg tablet,delayed release    [provider]  polyethylene glycol powder (GLYCOLAX/MIRALAX) 17 GM/SCOOP powder polyethylene glycol 3350 17 gram/dose oral powder    [provider]  traMADol (ULTRAM) 50 MG tablet tramadol 50 mg tablet    [provider]    Physical Exam: Vitals:   01/24/20 0822 01/24/20  0823  BP: (!) 145/90   Pulse: 92   Resp: 20   Temp: 98.6 F (37 C)   TempSrc: Oral   SpO2: 95%   Weight:  (!) 158.8 kg  Height:  5\' 7"  (1.702 m)     Vitals:   01/24/20 0822 01/24/20 0823  BP: (!) 145/90   Pulse: 92   Resp: 20   Temp: 98.6 F (37 C)   TempSrc: Oral   SpO2: 95%   Weight:  (!) 158.8 kg  Height:  5\' 7"  (1.702 m)    Constitutional: NAD, alert and oriented x 3.  Morbidly obese Eyes: PERRL, lids and conjunctivae normal ENMT: Mucous membranes are moist.  Neck: normal, supple, no masses, no thyromegaly Respiratory: clear to auscultation bilaterally, no wheezing, no crackles. Normal respiratory effort. No accessory muscle use.  Cardiovascular: Regular rate and rhythm, no murmurs / rubs / gallops.  Chronic lower extremity  lymphedema 2+ pedal pulses. No carotid bruits.  Abdomen: no tenderness, no masses palpated. No hepatosplenomegaly. Bowel sounds positive.  Central adiposity Musculoskeletal: no clubbing / cyanosis. No joint deformity upper and lower extremities.  Skin: no rashes, lesions, ulcers.  Neurologic: No gross focal neurologic deficit. Psychiatric: Normal mood and affect.   Labs on Admission: I have personally reviewed following labs and imaging studies  CBC: Recent Labs  Lab 01/24/20 0829  WBC 6.9  HGB 15.4  HCT 47.8  MCV 93.7  PLT 169   Basic Metabolic Panel: Recent Labs  Lab 01/24/20 0829  NA 137  K 4.1  CL 101  CO2 25  GLUCOSE 123*  BUN 20  CREATININE 1.70*  CALCIUM 8.9   GFR: Estimated Creatinine Clearance: 75.9 mL/min (A) (by C-G formula based on SCr of 1.7 mg/dL (H)). Liver Function Tests: Recent Labs  Lab 01/24/20 0829  AST 16  ALT 21  ALKPHOS 64  BILITOT 1.4*  PROT 8.6*  ALBUMIN 4.4   No results for input(s): LIPASE, AMYLASE in the last 168 hours. No results for input(s): AMMONIA in the last 168 hours. Coagulation Profile: Recent Labs  Lab 01/24/20 0829  INR 1.2   Cardiac Enzymes: No results for input(s):  CKTOTAL, CKMB, CKMBINDEX, TROPONINI in the last 168 hours. BNP (last 3 results) No results for input(s): PROBNP in the last 8760 hours. HbA1C: No results for input(s): HGBA1C in the last 72 hours. CBG: No results for input(s): GLUCAP in the last 168 hours. Lipid Profile: No results for input(s): CHOL, HDL, LDLCALC, TRIG, CHOLHDL, LDLDIRECT in the last 72 hours. Thyroid Function Tests: No results for input(s): TSH, T4TOTAL, FREET4, T3FREE, THYROIDAB in the last 72 hours. Anemia Panel: No results for input(s): VITAMINB12, FOLATE, FERRITIN, TIBC, IRON, RETICCTPCT in the last 72 hours. Urine analysis:    Component Value Date/Time   COLORURINE Yellow 12/25/2014 1655   APPEARANCEUR Hazy 12/25/2014 1655   LABSPEC 1.012 12/25/2014 1655   PHURINE 5.0 12/25/2014 1655   GLUCOSEU Negative 12/25/2014 1655   HGBUR 3+ 12/25/2014 1655   BILIRUBINUR Negative 12/25/2014 1655   KETONESUR Negative 12/25/2014 1655   PROTEINUR 30 mg/dL 16/10/960404/27/2016 [id number]   NITRITE Negative 12/25/2014 1655   LEUKOCYTESUR Negative 12/25/2014 1655    Radiological Exams on Admission: DG Chest 2 View  Result Date: 01/24/2020 CLINICAL DATA:  Shortness of breath. EXAM: CHEST - 2 VIEW COMPARISON:  08/08/2017 FINDINGS: Cardiomediastinal contours and hilar structures are normal. Lungs are clear. Visualized bony structures with limited assessment. No acute findings. IMPRESSION: No acute cardiopulmonary disease. Electronically Signed   By: Donzetta KohutGeoffrey  Wile M.D.   On: 01/24/2020 08:49   CT Angio Chest PE W and/or Wo Contrast  Result Date: 01/24/2020 CLINICAL DATA:  Shortness of breath EXAM: CT ANGIOGRAPHY CHEST WITH CONTRAST TECHNIQUE: Multidetector CT imaging of the chest was performed using the standard protocol during bolus administration of intravenous contrast. Multiplanar CT image reconstructions and MIPs were obtained to evaluate the vascular anatomy. CONTRAST:  75mL OMNIPAQUE IOHEXOL 350 MG/ML SOLN COMPARISON:  Chest radiograph  Jan 24, 2020; CT angiogram chest August 03, 2017 FINDINGS: Cardiovascular: There is pulmonary embolus extending from along the anterior aspect of the right main pulmonary artery into several right lower lobe pulmonary artery branches. In comparison with the 2018 study, the embolic changes in the right main pulmonary artery may well be chronic given the somewhat irregular contour of the embolus. There are multiple right lower lobe pulmonary emboli with a greater degree of burden of  pulmonary embolus in the right lower lobe pulmonary artery vessels compared to the previous study from 2018. Question a degree of acute as well as chronic pulmonary embolus on the right. Previous pulmonary emboli on the left have resolved since the 2018 study. There is no appreciable right heart strain. No thoracic aortic aneurysm or dissection. Visualized great vessels appear unremarkable. Note that the right innominate and left common carotid arteries arise as a common trunk, an anatomic variant. No pericardial effusion or pericardial thickening is evident. The main pulmonary outflow tract measures 3.6 cm in diameter, enlarged. Mediastinum/Nodes: Visualized thyroid is unremarkable. There are scattered subcentimeter mediastinal lymph nodes. There is no adenopathy by size criteria. There is a small hiatal hernia. Lungs/Pleura: There are scattered areas of atelectatic change. No edema or airspace opacity. No appreciable pleural effusions. Upper Abdomen: There is incomplete visualization of a filter in the inferior vena cava. There is hepatic steatosis. Visualized upper abdominal structures otherwise appear unremarkable. Musculoskeletal: There is degenerative change in the lower thoracic spine. No blastic or lytic bone lesions are evident. No chest wall lesions appreciable. Review of the MIP images confirms the above findings. IMPRESSION: 1. In comparison with the prior study from 2018, there remains pulmonary embolus in the right main  pulmonary artery which appears similar in distribution and appears slightly irregular in contour. 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 2. There has been an increase in pulmonary embolus burden in the lower lobe pulmonary arteries on the right compared to the previous study. While these changes could represent chronic pulmonary embolus which occurred in the interval between the 2018 study and current study, acute pulmonary embolus superimposed on chronic pulmonary embolus must be of concern in the right lower lobe pulmonary artery branches given the change from the most recent available study. 3. Interval clearing of pulmonary embolus from the left lower lobe compared to previous study. 4. Enlargement of the main pulmonary outflow tract consistent with pulmonary are per tension. 5.  Scattered areas of atelectasis.  No edema or airspace opacity. 6.  No evident adenopathy. 7.  Small hiatal hernia. 8.  Incomplete visualization of a filter in the inferior vena cava. 9.  Hepatic steatosis. Critical Value/emergent results were called by telephone at the time of interpretation on 01/24/2020 at 10:44 am to provider [facility] , who verbally acknowledged these results. Electronically Signed   By: Lowella Grip III M.D.   On: 01/24/2020 10:44   US Venous Img Lower Bilateral  Result Date: 01/24/2020 CLINICAL DATA:  Worsening bilateral lower extremity pain and edema. History of previous DVT. Evaluate for acute or chronic DVT. EXAM: BILATERAL LOWER EXTREMITY VENOUS DOPPLER ULTRASOUND TECHNIQUE: Gray-scale sonography with graded compression, as well as color Doppler and duplex ultrasound were performed to evaluate the lower extremity deep venous systems from the level of the common femoral vein and including the common femoral, femoral, profunda femoral, popliteal and calf veins including the posterior tibial, peroneal and gastrocnemius veins when visible. The superficial great saphenous vein was also  interrogated. Spectral Doppler was utilized to evaluate flow at rest and with distal augmentation maneuvers in the common femoral, femoral and popliteal veins. COMPARISON:  Bilateral lower extremity venous Doppler ultrasound-08/04/2017 (chronic thrombus noted within the bilateral femoral and popliteal veins) FINDINGS: RIGHT LOWER EXTREMITY There is hypoechoic occlusive thrombus within the right common femoral vein (image 36, extending to involve the saphenofemoral junction (image 38), not seen on the 12/20 18 examination. The right deep femoral vein appears patent where imaged.  There is hypoechoic occlusive thrombus involving the proximal (image 44), mid (image 47) and distal (image 50) aspects of the right femoral vein, progressed compared to the 2018 examination. There is hypoechoic occlusive thrombus involving the proximal (image 54 and distal (image 56) aspects of the right popliteal vein. The tibial veins are poorly visualized. Other Findings:  None. _________________________________________________________ LEFT LOWER EXTREMITY Common Femoral Vein: No evidence of thrombus. Normal compressibility, respiratory phasicity and response to augmentation. Saphenofemoral Junction: No evidence of thrombus. Normal compressibility and flow on color Doppler imaging. Profunda Femoral Vein: No evidence of thrombus. Normal compressibility and flow on color Doppler imaging. Femoral Vein: While there is no definitive evidence of acute or chronic DVT involving the proximal and mid aspects of the left femoral vein, there is hypoechoic occlusive thrombus involving the distal aspects of both paired femoral veins (images 20 through 22). Popliteal Vein: Hypoechoic occlusive thrombus seen involving the proximal (image 26) and distal (image 29) aspects of the left femoral vein, similar to the 2018 examination. Calf Veins: No evidence of thrombus. Normal compressibility and flow on color Doppler imaging. Superficial Great Saphenous Vein:  No evidence of thrombus. Normal compressibility. Other Findings:  None. IMPRESSION: 1. Acute on chronic, predominantly occlusive DVT extending from the right common femoral vein through the right popliteal vein, progressed compared to the 2018 examination. 2. Occlusive DVT involving the distal aspects of both paired left femoral veins extending to involve the left popliteal vein - while the clot burden within the left lower extremity is improved compared to the 2018 examination, in the absence of more recent prior examinations, an acute on chronic process is not excluded. Electronically Signed   By: Simonne Come M.D.   On: 01/24/2020 10:23    EKG: Independently reviewed.  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 Patient has not been in Coumadin for about a month because he states that his primary care provider refused to give him any more refills since he was not showing up for his INR check (patient states that he has not left his house since March 2020 due to the pandemic) He presents for evaluation of shortness of breath and right thigh pain and is noted to have acute on chronic DVT and PE INR on admission was 1.2 Continue heparin bridge Resume Coumadin 12 mg daily Obtain daily PT/INR   Morbid obesity (BMI 54) Complicates overall prognosis and care  Hypertension Continue metoprolol and hydrochlorothiazide  Stage III chronic kidney disease Renal function is stable we will monitor closely during this hospitalization   Chronic diastolic dysfunction  CHF Stable and not in acute exacerbation Optimize blood pressure control Continue metoprolol and hydrochlorothiazide  DVT prophylaxis: Heparin Code Status: Full  Family Communication: Plan of care was discussed with patient in detail at the bedside.  He verbalizes understanding and agrees with the plan. Disposition Plan: Back to previous home environment Consults called: None    Steffen Hase MD [facility]     01/24/2020, 2:37 PM

## 2020-01-24 NOTE — ED Provider Notes (Signed)
[facility] Emergency Department Provider Note  ____________________________________________   None    (approximate)  I have reviewed the triage vital signs and the nursing notes.   HISTORY  Chief Complaint Shortness of Breath    HPI Ryan [name] is a 49 y.o. male with history of PE, DVTs, IVC filter who was supposed to be on warfarin who comes in with shortness of breath.  Patient states that has not been taking his warfarin due to missing a appointment with his primary care doctor.  He reports having worsening shortness of breath that is severe, constant, nothing makes better, nothing makes it worse.  Feels like his prior PE.  Also reports worsening leg swelling bilaterally.  Denies any abdominal pain.          Past Medical History:  Diagnosis Date  . CHF (congestive heart failure) (HCC)   . Diabetes mellitus without complication (HCC)    per pt-pre diabetic-Dr Dario Guardian stated said pt  . DVT (deep venous thrombosis) (HCC)   . Hypertension   . Peripheral vascular disease (HCC)   . Presence of IVC filter     Patient Active Problem List   Diagnosis Date Noted  . Diabetes (HCC) 08/03/2017  . CAP (community acquired pneumonia) 08/03/2017  . CKD (chronic kidney disease), stage III 08/03/2017  . Pulmonary embolus (HCC) 01/03/2017  . Chronic diastolic heart failure (HCC) 12/15/2016  . HTN (hypertension) 12/15/2016  . Fatigue 12/15/2016  . PVC (premature ventricular contraction) 11/24/2016  . 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  . Super obesity 11/24/2016  . Pulmonary emboli (HCC) 11/22/2016  . DVT (deep venous thrombosis) (HCC) 12/28/2014    Past Surgical History:  Procedure Laterality Date  . PERIPHERAL VASCULAR CATHETERIZATION N/A 01/10/2015   Procedure: Dialysis/Perma Catheter Insertion;  Surgeon: Renford Dills, MD;  Location: [facility] INVASIVE CV LAB;  Service: Cardiovascular;  Laterality: N/A;  . PERIPHERAL VASCULAR CATHETERIZATION N/A  02/24/2015   Procedure: Dialysis/Perma Catheter Removal;  Surgeon: Annice Needy, MD;  Location: [facility] INVASIVE CV LAB;  Service: Cardiovascular;  Laterality: N/A;    Prior to Admission medications   Medication Sig Start Date End Date Taking? Authorizing Provider  fluticasone (FLONASE) 50 MCG/ACT nasal spray Place 2 sprays into both nostrils 2 (two) times daily. 08/12/17   Alford Highland, MD  ipratropium (ATROVENT) 0.06 % nasal spray Place 2 sprays into both nostrils 2 (two) times daily. 08/12/17   Alford Highland, MD  lisinopril (PRINIVIL,ZESTRIL) 10 MG tablet Take 1 tablet (10 mg total) by mouth daily. 11/24/16   Ramonita Lab, MD  protein supplement shake (PREMIER PROTEIN) LIQD Take 325 mLs (11 oz total) by mouth 2 (two) times daily between meals. 08/12/17   Alford Highland, MD  Vitamin D, Ergocalciferol, (DRISDOL) 50000 units CAPS capsule Take 50,000 Units by mouth every 7 (seven) days.    [provider]  warfarin (COUMADIN) 6 MG tablet Take 2 tablets (12 mg total) by mouth daily at 6 PM. 08/12/17   Alford Highland, MD    Allergies Clindamycin/lincomycin and Sulfa antibiotics  Family History  Problem Relation Age of Onset  . Hypertension Mother   . Cancer Mother   . Breast cancer Mother   . Prostate cancer Father   . Hypertension Father   . Diabetes Father     Social History Social History   Tobacco Use  . Smoking status: Never Smoker  . Smokeless tobacco: Never Used  Substance Use Topics  . Alcohol use: No  . Drug use:  No      Review of Systems Constitutional: No fever/chills Eyes: No visual changes. ENT: No sore throat. Cardiovascular: No chest pain, positive leg swelling Respiratory: Positive for SOB Gastrointestinal: No abdominal pain.  No nausea, no vomiting.  No diarrhea.  No constipation. Genitourinary: Negative for dysuria. Musculoskeletal: Negative for back pain. Skin: Negative for rash. Neurological: Negative for headaches, focal weakness or  numbness. All other ROS negative ____________________________________________   PHYSICAL EXAM:  VITAL SIGNS: ED Triage Vitals  Enc Vitals Group     BP 01/24/20 0822 (!) 145/90     Pulse Rate 01/24/20 0822 92     Resp 01/24/20 0822 20     Temp 01/24/20 0822 98.6 F (37 C)     Temp Source 01/24/20 0822 Oral     SpO2 01/24/20 0822 95 %     Weight 01/24/20 0823 (!) 350 lb (158.8 kg)     Height 01/24/20 0823 5\' 7"  (1.702 m)     Head Circumference --      Peak Flow --      Pain Score 01/24/20 0822 8     Pain Loc --      Pain Edu? --      Excl. in GC? --     Constitutional: Alert and oriented. Well appearing and in no acute distress. Eyes: Conjunctivae are normal. EOMI. Head: Atraumatic. Nose: No congestion/rhinnorhea. Mouth/Throat: Mucous membranes are moist.   Neck: No stridor. Trachea Midline. FROM Cardiovascular: Normal rate, regular rhythm. Grossly normal heart sounds.  Good peripheral circulation. Respiratory: No increased work of breathing, clear lungs Gastrointestinal: Soft and nontender. No distention. No abdominal bruits.  Musculoskeletal: No lower extremity tenderness nor edema.  No joint effusions. Neurologic:  Normal speech and language. No gross focal neurologic deficits are appreciated.  Skin:  Skin is warm, dry and intact. No rash noted. Psychiatric: Mood and affect are normal. Speech and behavior are normal. GU: Deferred   ____________________________________________   LABS (all labs ordered are listed, but only abnormal results are displayed)  Labs Reviewed  BASIC METABOLIC PANEL - Abnormal; Notable for the following components:      Result Value   Glucose, Bld 123 (*)    Creatinine, Ser 1.70 (*)    GFR calc non Af Amer 46 (*)    GFR calc Af Amer 53 (*)    All other components within normal limits  HEPATIC FUNCTION PANEL - Abnormal; Notable for the following components:   Total Protein 8.6 (*)    Total Bilirubin 1.4 (*)    Indirect Bilirubin 1.2  (*)    All other components within normal limits  CBC  BRAIN NATRIURETIC PEPTIDE  PROTIME-INR  APTT  TROPONIN I (HIGH SENSITIVITY)   ____________________________________________   ED ECG REPORT I, Concha SeMary E Breelle Hollywood, the attending physician, personally viewed and interpreted this ECG.  EKG is normal sinus rhythm 97, no ST elevation, no T wave inversions, normal intervals ____________________________________________  RADIOLOGY Vela ProseI, Arihana Ambrocio E Simmie Camerer, personally viewed and evaluated these images (plain radiographs) as part of my medical decision making, as well as reviewing the written report by the radiologist.  ED MD interpretation: No pneumonia  Official radiology report(s): DG Chest 2 View  Result Date: 01/24/2020 CLINICAL DATA:  Shortness of breath. EXAM: CHEST - 2 VIEW COMPARISON:  08/08/2017 FINDINGS: Cardiomediastinal contours and hilar structures are normal. Lungs are clear. Visualized bony structures with limited assessment. No acute findings. IMPRESSION: No acute cardiopulmonary disease. Electronically Signed   By: Juliene PinaGeoffrey  Wile M.D.   On: 01/24/2020 08:49   CT Angio Chest PE W and/or Wo Contrast  Result Date: 01/24/2020 CLINICAL DATA:  Shortness of breath EXAM: CT ANGIOGRAPHY CHEST WITH CONTRAST TECHNIQUE: Multidetector CT imaging of the chest was performed using the standard protocol during bolus administration of intravenous contrast. Multiplanar CT image reconstructions and MIPs were obtained to evaluate the vascular anatomy. CONTRAST:  33mL OMNIPAQUE IOHEXOL 350 MG/ML SOLN COMPARISON:  Chest radiograph Jan 24, 2020; CT angiogram chest August 03, 2017 FINDINGS: Cardiovascular: There is pulmonary embolus extending from along the anterior aspect of the right main pulmonary artery into several right lower lobe pulmonary artery branches. In comparison with the 2018 study, the embolic changes in the right main pulmonary artery may well be chronic given the somewhat irregular contour of the  embolus. There are multiple right lower lobe pulmonary emboli with a greater degree of burden of pulmonary embolus in the right lower lobe pulmonary artery vessels compared to the previous study from 2018. Question a degree of acute as well as chronic pulmonary embolus on the right. Previous pulmonary emboli on the left have resolved since the 2018 study. There is no appreciable right heart strain. No thoracic aortic aneurysm or dissection. Visualized great vessels appear unremarkable. Note that the right innominate and left common carotid arteries arise as a common trunk, an anatomic variant. No pericardial effusion or pericardial thickening is evident. The main pulmonary outflow tract measures 3.6 cm in diameter, enlarged. Mediastinum/Nodes: Visualized thyroid is unremarkable. There are scattered subcentimeter mediastinal lymph nodes. There is no adenopathy by size criteria. There is a small hiatal hernia. Lungs/Pleura: There are scattered areas of atelectatic change. No edema or airspace opacity. No appreciable pleural effusions. Upper Abdomen: There is incomplete visualization of a filter in the inferior vena cava. There is hepatic steatosis. Visualized upper abdominal structures otherwise appear unremarkable. Musculoskeletal: There is degenerative change in the lower thoracic spine. No blastic or lytic bone lesions are evident. No chest wall lesions appreciable. Review of the MIP images confirms the above findings. IMPRESSION: 1. In comparison with the prior study from 2018, there remains pulmonary embolus in the right main pulmonary artery which appears similar in distribution and appears slightly irregular in contour. 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 2. There has been an increase in pulmonary embolus burden in the lower lobe pulmonary arteries on the right compared to the previous study. While these changes could represent chronic pulmonary embolus which occurred in the interval between the  2018 study and current study, acute pulmonary embolus superimposed on chronic pulmonary embolus must be of concern in the right lower lobe pulmonary artery branches given the change from the most recent available study. 3. Interval clearing of pulmonary embolus from the left lower lobe compared to previous study. 4. Enlargement of the main pulmonary outflow tract consistent with pulmonary are per tension. 5.  Scattered areas of atelectasis.  No edema or airspace opacity. 6.  No evident adenopathy. 7.  Small hiatal hernia. 8.  Incomplete visualization of a filter in the inferior vena cava. 9.  Hepatic steatosis. Critical Value/emergent results were called by telephone at the time of interpretation on 01/24/2020 at 10:44 am to provider [facility] , who verbally acknowledged these results. Electronically Signed   By: Bretta Bang III M.D.   On: 01/24/2020 10:44   US Venous Img Lower Bilateral  Result Date: 01/24/2020 CLINICAL DATA:  Worsening bilateral lower extremity pain and edema. History of previous DVT. Evaluate for  acute or chronic DVT. EXAM: BILATERAL LOWER EXTREMITY VENOUS DOPPLER ULTRASOUND TECHNIQUE: Gray-scale sonography with graded compression, as well as color Doppler and duplex ultrasound were performed to evaluate the lower extremity deep venous systems from the level of the common femoral vein and including the common femoral, femoral, profunda femoral, popliteal and calf veins including the posterior tibial, peroneal and gastrocnemius veins when visible. The superficial great saphenous vein was also interrogated. Spectral Doppler was utilized to evaluate flow at rest and with distal augmentation maneuvers in the common femoral, femoral and popliteal veins. COMPARISON:  Bilateral lower extremity venous Doppler ultrasound-08/04/2017 (chronic thrombus noted within the bilateral femoral and popliteal veins) FINDINGS: RIGHT LOWER EXTREMITY There is hypoechoic occlusive thrombus within the right  common femoral vein (image 36, extending to involve the saphenofemoral junction (image 38), not seen on the 12/20 18 examination. The right deep femoral vein appears patent where imaged. There is hypoechoic occlusive thrombus involving the proximal (image 44), mid (image 47) and distal (image 50) aspects of the right femoral vein, progressed compared to the 2018 examination. There is hypoechoic occlusive thrombus involving the proximal (image 54 and distal (image 56) aspects of the right popliteal vein. The tibial veins are poorly visualized. Other Findings:  None. _________________________________________________________ LEFT LOWER EXTREMITY Common Femoral Vein: No evidence of thrombus. Normal compressibility, respiratory phasicity and response to augmentation. Saphenofemoral Junction: No evidence of thrombus. Normal compressibility and flow on color Doppler imaging. Profunda Femoral Vein: No evidence of thrombus. Normal compressibility and flow on color Doppler imaging. Femoral Vein: While there is no definitive evidence of acute or chronic DVT involving the proximal and mid aspects of the left femoral vein, there is hypoechoic occlusive thrombus involving the distal aspects of both paired femoral veins (images 20 through 22). Popliteal Vein: Hypoechoic occlusive thrombus seen involving the proximal (image 26) and distal (image 29) aspects of the left femoral vein, similar to the 2018 examination. Calf Veins: No evidence of thrombus. Normal compressibility and flow on color Doppler imaging. Superficial Great Saphenous Vein: No evidence of thrombus. Normal compressibility. Other Findings:  None. IMPRESSION: 1. Acute on chronic, predominantly occlusive DVT extending from the right common femoral vein through the right popliteal vein, progressed compared to the 2018 examination. 2. Occlusive DVT involving the distal aspects of both paired left femoral veins extending to involve the left popliteal vein - while the  clot burden within the left lower extremity is improved compared to the 2018 examination, in the absence of more recent prior examinations, an acute on chronic process is not excluded. Electronically Signed   By: Sandi Mariscal M.D.   On: 01/24/2020 10:23    ____________________________________________   PROCEDURES  Procedure(s) performed (including Critical Care):  .Critical Care Performed by: Vanessa Bellevue, MD Authorized by: Vanessa , MD   Critical care provider statement:    Critical care time (minutes):  45   Critical care was necessary to treat or prevent imminent or life-threatening deterioration of the following conditions:  Respiratory failure   Critical care was time spent personally by me on the following activities:  Discussions with consultants, evaluation of patient's response to treatment, examination of patient, ordering and performing treatments and interventions, ordering and review of laboratory studies, ordering and review of radiographic studies, pulse oximetry, re-evaluation of patient's condition, obtaining history from patient or surrogate and review of old charts     ____________________________________________   INITIAL IMPRESSION / ASSESSMENT AND PLAN / ED COURSE   [name] was  evaluated in Emergency Department on 01/24/2020 for the symptoms described in the history of present illness. He was evaluated in the context of the global COVID-19 pandemic, which necessitated consideration that the patient might be at risk for infection with the SARS-CoV-2 virus that causes COVID-19. Institutional protocols and algorithms that pertain to the evaluation of patients at risk for COVID-19 are in a state of rapid change based on information released by regulatory bodies including the CDC and federal and state organizations. These policies and algorithms were followed during the patient's care in the ED.     Pt presents with SOB.  Given patient's not been therapeutic  with his warfarin is most concerning for PE.  Will get CT scan to further evaluate.  Will also get ultrasound to evaluate for worsening clot burden although patient does have an IVC filter he states that he has had PE even with the IVC filter in place  PNA-will get xray to evaluation Anemia-CBC to evaluate ACS- will get trops Arrhythmia-Will get EKG and keep on monitor.  COVID- will get testing per algorithm.   Labs are reassuring.  Kidney function slightly elevated.  Ultrasound so clot burden appears to be slightly acute on chronic.  CT scan shows increased pulmonary embolism burden in the lower lobe pulmonary artery on the right compared to prior.,  The right main pulmonary artery appears to be stable.  Clearing on the left lower lobe.  Patient subtherapeutic on his INR and his CT scan is concerning for worsening clot burden I suspect that we should admit patient for heparin bridge to warfarin.  Will discuss with vascular to see they have any other suggestions.  Discussed with Dr. Gilda Crease who agrees with heparin bridge to his warfarin.  Also recommended we could get a CTV tomorrow to assess his IVC filter to make sure that it is working.  He may need to have that replaced.  We will discuss with the hospital team and start patient on heparin for worsening clot burden in the setting of shortness of breath  ____________________________________________   FINAL CLINICAL IMPRESSION(S) / ED DIAGNOSES   Final diagnoses:  SOB (shortness of breath)  Acute pulmonary embolism without acute cor pulmonale, unspecified pulmonary embolism type (HCC)  Acute deep vein thrombosis (DVT) of proximal vein of both lower extremities (HCC)     MEDICATIONS GIVEN DURING THIS VISIT:  Medications  iohexol (OMNIPAQUE) 350 MG/ML injection 75 mL (75 mLs Intravenous Contrast Given 01/24/20 1023)     ED Discharge Orders    None       Note:  This document was prepared using Dragon voice recognition  software and may include unintentional dictation errors.   Concha Se, MD 01/24/20 1118

## 2020-01-24 NOTE — Consult Note (Signed)
ANTICOAGULATION CONSULT NOTE -   Pharmacy Consult for Heparin Infusion Indication: pulmonary embolus  Allergies  Allergen Reactions  . Clindamycin/Lincomycin Rash  . Sulfa Antibiotics Rash    Patient Measurements: Height: 5\' 7"  (170.2 cm) Weight: (!) 158.8 kg (350 lb) IBW/kg (Calculated) : 66.1 Heparin Dosing Weight: 105.5 kg  Vital Signs: BP: 156/99 (05/27 2239) Pulse Rate: 86 (05/27 2239)  Labs: Recent Labs    01/24/20 0829 01/24/20 1250 01/24/20 2005  HGB 15.4  --   --   HCT 47.8  --   --   PLT 169  --   --   APTT 27  --   --   LABPROT 14.6  --   --   INR 1.2  --   --   HEPARINUNFRC  --  <0.10* 0.36  CREATININE 1.70*  --   --   TROPONINIHS 7  --   --     Estimated Creatinine Clearance: 75.9 mL/min (A) (by C-G formula based on SCr of 1.7 mg/dL (H)).   Medical History: Past Medical History:  Diagnosis Date  . CHF (congestive heart failure) ([place])   . Diabetes mellitus without complication ([place])    per pt-pre diabetic-Dr Rosario Jacks stated said pt  . DVT (deep venous thrombosis) ([place])   . Hypertension   . Peripheral vascular disease ([place])   . Presence of IVC filter     Medications:  Medications Prior to Admission  Medication Sig Dispense Refill Last Dose  . atorvastatin (LIPITOR) 10 MG tablet atorvastatin 10 mg tablet  TK 1 T PO QD   Past Month at Unknown time  . b complex vitamins capsule Take by mouth.   Past Month at Unknown time  . warfarin (COUMADIN) 6 MG tablet Take 2 tablets (12 mg total) by mouth daily at 6 PM. 60 tablet 0 60+ days at Unknown time  . benzonatate (TESSALON) 100 MG capsule benzonatate 100 mg capsule  TK 1 C PO TID   Not Taking at Unknown time  . calcium acetate (PHOSLO) 667 MG capsule calcium acetate(phosphate binders) 667 mg capsule   Not Taking at Unknown time  . Docusate Sodium (DSS) 100 MG CAPS Doc-Q-Lace 100 mg capsule  TK 1 C PO BID PRN   Not Taking at Unknown time  . fluticasone (FLONASE) 50 MCG/ACT nasal spray Place 2 sprays  into both nostrils 2 (two) times daily. (Patient not taking: Reported on 01/24/2020) 16 g 0 Not Taking  . hydrochlorothiazide (HYDRODIURIL) 25 MG tablet hydrochlorothiazide 25 mg tablet     . metoprolol tartrate (LOPRESSOR) 25 MG tablet metoprolol tartrate 25 mg tablet  TK 1 T PO  BID FOR BP     . ondansetron (ZOFRAN) 4 MG tablet ondansetron HCl 4 mg tablet  TK 1 T PO Q 8 H     . ondansetron (ZOFRAN) 8 MG tablet ondansetron HCl 8 mg tablet     . pantoprazole (PROTONIX) 40 MG tablet pantoprazole 40 mg tablet,delayed release     . polyethylene glycol powder (GLYCOLAX/MIRALAX) 17 GM/SCOOP powder polyethylene glycol 3350 17 gram/dose oral powder     . traMADol (ULTRAM) 50 MG tablet tramadol 50 mg tablet      Scheduled:  . atorvastatin  10 mg Oral Daily  . hydrochlorothiazide  25 mg Oral Daily  . metoprolol tartrate  25 mg Oral BID  . multivitamin with minerals  1 tablet Oral Daily  . pantoprazole  40 mg Oral Daily  . warfarin  12 mg Oral q1800  .  Warfarin - Physician Dosing Inpatient   Does not apply q1600   Infusions:  . heparin 1,400 Units/hr (01/24/20 1637)   PRN:  Anti-infectives (From admission, onward)   None      Assessment: Pharmacy has been consulted to initiate Heparin Infusion in 49yo patient with history of PE, DVTs,  And IVC filter placement who is on warfarin PTA but has been noncompliant for the past month. Was previously on apixaban but had same non-compliance issue.  CT confirmed PE in right pulmonary artery. Baseline labs: INR 1.2, APTT 27sec, HL<0.1, Hgb 15.4, PLTs 169.  0527 2005 HL 0.36, therapeutic x 1  Goal of Therapy:  Heparin level 0.3-0.7 units/ml Monitor platelets by anticoagulation protocol: Yes   Plan:  Continue heparin infusion at 1400 units/hr Check anti-Xa level in am to confirm and daily while on heparin Continue to monitor H&H and platelets  Valrie Hart A 01/24/2020,11:28 PM

## 2020-01-24 NOTE — ED Triage Notes (Signed)
Pt comes into the ED via EMS from home, c/o SOB BL leg pain with a hx of DVT/PE with swelling and tightness, worse with movement, pt is in NAD on arrival 94% on RA.[name]

## 2020-01-24 NOTE — ED Notes (Addendum)
NP Ouma notified of pt's elevated bp and increase RR. Pt RR increase to 27-30 when moving, RR 19-20 at rest.

## 2020-01-24 NOTE — ED Triage Notes (Signed)
See first nurse note. Patient c/o shortness of breath on exertion (5-6 steps make patient short of breath). Patient has IVC filters for h/o DVT and PE. Patient reports bilateral leg swelling.

## 2020-01-24 NOTE — ED Notes (Signed)
Pt watching tv. Bed locked low. Rail up. Call bell within reach. Denies any other needs. Lights dimmed for pt.

## 2020-01-24 NOTE — ED Provider Notes (Signed)
HPI: Pt is a 49 y.o. male who presents with complaints of SOB  The patient p/w SOB. Pt not taking blood thinner warfarin. PE 2 years ago with infarct   ROS: Denies fever, chest pain, vomiting.   Past Medical History:  Diagnosis Date  . CHF (congestive heart failure) (HCC)   . Diabetes mellitus without complication (HCC)    per pt-pre diabetic-Dr Dario Guardian stated said pt  . DVT (deep venous thrombosis) (HCC)   . Hypertension   . Peripheral vascular disease (HCC)   . Presence of IVC filter    Vitals:   01/24/20 0822  BP: (!) 145/90  Pulse: 92  Resp: 20  Temp: 98.6 F (37 C)  SpO2: 95%    Focused Physical Exam: Gen: No acute distress Head: atraumatic, normocephalic Eyes: Extraocular movements grossly intact; conjunctiva clear CV: RRR Lung: No increased WOB, no stridor GI: soft, ND, no obvious masses Neuro: Alert and awake  Medical Decision Making and Plan: Given the patient's initial medical screening exam, the following diagnostic evaluation has been ordered. The patient will be placed in the appropriate treatment space, once one is available, to complete the evaluation and treatment. I have discussed the plan of care with the patient and I have advised the patient that an ED physician or mid-level practitioner will reevaluate their condition after the test results have been received, as the results may give them additional insight into the type of treatment they may need.   Diagnostics: Labs, EKG, CXR, CT PE, [place]   Treatments: none immediately     Concha Se, MD 01/24/20 [phone]

## 2020-01-24 NOTE — Consult Note (Signed)
ANTICOAGULATION CONSULT NOTE - Initial Consult  Pharmacy Consult for Heparin Infusion Indication: pulmonary embolus  Allergies  Allergen Reactions  . Clindamycin/Lincomycin Rash  . Sulfa Antibiotics Rash    Patient Measurements: Height: 5\' 7"  (170.2 cm) Weight: (!) 158.8 kg (350 lb) IBW/kg (Calculated) : 66.1 Heparin Dosing Weight: 105.5 kg  Vital Signs: Temp: 98.6 F (37 C) (05/27 0822) Temp Source: Oral (05/27 0822) BP: 145/90 (05/27 0822) Pulse Rate: 92 (05/27 0822)  Labs: Recent Labs    01/24/20 0829 01/24/20 1250  HGB 15.4  --   HCT 47.8  --   PLT 169  --   APTT 27  --   LABPROT 14.6  --   INR 1.2  --   HEPARINUNFRC  --  <0.10*  CREATININE 1.70*  --   TROPONINIHS 7  --     Estimated Creatinine Clearance: 75.9 mL/min (A) (by C-G formula based on SCr of 1.7 mg/dL (H)).   Medical History: Past Medical History:  Diagnosis Date  . CHF (congestive heart failure) (HCC)   . Diabetes mellitus without complication (HCC)    per pt-pre diabetic-Dr 01/26/20 stated said pt  . DVT (deep venous thrombosis) (HCC)   . Hypertension   . Peripheral vascular disease (HCC)   . Presence of IVC filter     Medications:  (Not in a hospital admission)  Scheduled:   Infusions:  . heparin     PRN:  Anti-infectives (From admission, onward)   None      Assessment: Pharmacy has been consulted to initiate Heparin Infusion in 49yo patient with history of PE, DVTs,  And IVC filter placement who is on warfarin PTA but has been noncompliant for the past month. Was previously on apixaban but had same non-compliance issue.  CT confirmed PE in right pulmonary artery. Baseline labs: INR 1.2, APTT 27sec, HL<0.1, Hgb 15.4, PLTs 169.  Goal of Therapy:  Heparin level 0.3-0.7 units/ml Monitor platelets by anticoagulation protocol: Yes   Plan:  Give 6000 units bolus x 1 Start heparin infusion at 1400 units/hr Check anti-Xa level in 6 hours and daily while on heparin Continue to  monitor H&H and platelets  Deicy Rusk A Halvor Behrend 01/24/2020,2:06 PM

## 2020-01-24 NOTE — ED Notes (Signed)
Pt given snack and drink 

## 2020-01-24 NOTE — ED Notes (Signed)
Pt up to side of bed to use urinal, pt helped back to bed.

## 2020-01-25 LAB — HIV ANTIBODY (ROUTINE TESTING W REFLEX): HIV Screen 4th Generation wRfx: NONREACTIVE

## 2020-01-25 LAB — CBC
HCT: 43.1 % (ref 39.0–52.0)
Hemoglobin: 14 g/dL (ref 13.0–17.0)
MCH: 30.4 pg (ref 26.0–34.0)
MCHC: 32.5 g/dL (ref 30.0–36.0)
MCV: 93.5 fL (ref 80.0–100.0)
Platelets: 164 10*3/uL (ref 150–400)
RBC: 4.61 MIL/uL (ref 4.22–5.81)
RDW: 14.5 % (ref 11.5–15.5)
WBC: 6 10*3/uL (ref 4.0–10.5)
nRBC: 0 % (ref 0.0–0.2)

## 2020-01-25 LAB — PROTIME-INR
INR: 1.1 (ref 0.8–1.2)
Prothrombin Time: 14.1 seconds (ref 11.4–15.2)

## 2020-01-25 LAB — HEPARIN LEVEL (UNFRACTIONATED)
Heparin Unfractionated: 0.3 IU/mL (ref 0.30–0.70)
Heparin Unfractionated: 0.34 IU/mL (ref 0.30–0.70)

## 2020-01-25 MED ORDER — WARFARIN - PHARMACIST DOSING INPATIENT: Freq: Every day | Status: DC

## 2020-01-25 NOTE — TOC Benefit Eligibility Note (Signed)
Transition of Care [facility]) Benefit Eligibility Note    Patient Details  Name: ASAAD GULLEY MRN: [id number] Date of Birth: May 17, 1971   Medication/Dose: Arne Cleveland  2.5 MG BID  CO-PAY- $40.00  and  ELIQUIS  5 MG BID  CO-PAY- $40.00  Covered?: Yes  Tier: 2 Drug  Prescription Coverage [facility]: [facility]  Spoke with Person/Company/Phone Number:: KIM   @  [employer] # [phone]  Co-Pay: $ 40.00  Prior Approval: No  Deductible: Met(OUT-OF-POCKET: UNMET)  Additional Notes: XARELTO 20 MG DAILY  COVER- YES CO-PAY- $40.00 TIER- 2  DRUG  P/A-NO  and  XARELTO 15 MG  BID  OVER QUALIT LIMIT P/A-YES # [phone]    Memory Argue Phone Number: 01/25/2020, 1:07 PM

## 2020-01-25 NOTE — Consult Note (Addendum)
Note reviewed. Agree with plan below.   Paschal Dopp, PharmD, BCPS  ANTICOAGULATION CONSULT NOTE - Initial Consult  Pharmacy Consult for Heparin and Warfarin  Indication: pulmonary embolus and DVT treatment  Allergies  Allergen Reactions  . Clindamycin/Lincomycin Rash  . Sulfa Antibiotics Rash    Patient Measurements: Height: 5\' 7"  (170.2 cm) Weight: (!) 160.5 kg (353 lb 13.4 oz) IBW/kg (Calculated) : 66.1 Heparin Dosing Weight: 106 kg  Vital Signs: Temp: 98.7 F (37.1 C) (05/28 0721) Temp Source: Oral (05/28 0721) BP: 141/96 (05/28 0934) Pulse Rate: 79 (05/28 0934)  Labs: Recent Labs    01/24/20 0829 01/24/20 1250 01/24/20 2005 01/25/20 0200 01/25/20 0900  HGB 15.4  --   --  14.0  --   HCT 47.8  --   --  43.1  --   PLT 169  --   --  164  --   APTT 27  --   --   --   --   LABPROT 14.6  --   --  14.1  --   INR 1.2  --   --  1.1  --   HEPARINUNFRC  --    < > 0.36 0.30 0.34  CREATININE 1.70*  --   --   --   --   TROPONINIHS 7  --   --   --   --    < > = values in this interval not displayed.    Estimated Creatinine Clearance: 76.4 mL/min (A) (by C-G formula based on SCr of 1.7 mg/dL (H)).   Medical History: Past Medical History:  Diagnosis Date  . CHF (congestive heart failure) (HCC)   . Diabetes mellitus without complication (HCC)    per pt-pre diabetic-Dr 01/27/20 stated said pt  . DVT (deep venous thrombosis) (HCC)   . Hypertension   . Peripheral vascular disease (HCC)   . Presence of IVC filter     Medications:  Medications Prior to Admission  Medication Sig Dispense Refill Last Dose  . atorvastatin (LIPITOR) 10 MG tablet atorvastatin 10 mg tablet  TK 1 T PO QD   Past Month at Unknown time  . b complex vitamins capsule Take by mouth.   Past Month at Unknown time  . warfarin (COUMADIN) 6 MG tablet Take 2 tablets (12 mg total) by mouth daily at 6 PM. 60 tablet 0 60+ days at Unknown time  . benzonatate (TESSALON) 100 MG capsule benzonatate 100 mg  capsule  TK 1 C PO TID   Not Taking at Unknown time  . calcium acetate (PHOSLO) 667 MG capsule calcium acetate(phosphate binders) 667 mg capsule   Not Taking at Unknown time  . Docusate Sodium (DSS) 100 MG CAPS Doc-Q-Lace 100 mg capsule  TK 1 C PO BID PRN   Not Taking at Unknown time  . fluticasone (FLONASE) 50 MCG/ACT nasal spray Place 2 sprays into both nostrils 2 (two) times daily. (Patient not taking: Reported on 01/24/2020) 16 g 0 Not Taking  . hydrochlorothiazide (HYDRODIURIL) 25 MG tablet hydrochlorothiazide 25 mg tablet     . metoprolol tartrate (LOPRESSOR) 25 MG tablet metoprolol tartrate 25 mg tablet  TK 1 T PO  BID FOR BP     . ondansetron (ZOFRAN) 4 MG tablet ondansetron HCl 4 mg tablet  TK 1 T PO Q 8 H     . ondansetron (ZOFRAN) 8 MG tablet ondansetron HCl 8 mg tablet     . pantoprazole (PROTONIX) 40 MG tablet pantoprazole 40 mg tablet,delayed  release     . polyethylene glycol powder (GLYCOLAX/MIRALAX) 17 GM/SCOOP powder polyethylene glycol 3350 17 gram/dose oral powder     . traMADol (ULTRAM) 50 MG tablet tramadol 50 mg tablet      Scheduled:  . atorvastatin  10 mg Oral Daily  . hydrochlorothiazide  25 mg Oral Daily  . metoprolol tartrate  25 mg Oral BID  . multivitamin with minerals  1 tablet Oral Daily  . pantoprazole  40 mg Oral Daily  . warfarin  12 mg Oral q1800  . Warfarin - Pharmacist Dosing Inpatient   Does not apply q1600   Infusions:  . heparin 1,500 Units/hr (01/25/20 0254)   PRN: hydrALAZINE, ondansetron **OR** ondansetron (ZOFRAN) IV, traMADol Anti-infectives (From admission, onward)   None      Assessment: Pharmacy has been consulted to initiate Heparin Infusion and warfarin dosing in 49yo patient with history of PE, DVTs,  and IVC filter placement who was on warfarin PTA but has been noncompliant for the past month. Was previously on apixaban but had same non-compliance issue. CT confirmed PE in right pulmonary artery and ultrasound findings of acute on  chronic, predominantly occlusive DVT. Baseline labs: INR 1.2, APTT 27sec, HL<0.1, Hgb 15.4, PLTs 169.  0527 2005 HL 0.36, therapeutic x 1 0528 0200 HL 0.30, therapeutic x 2 but on low end and trending down, CBC stable 0528 0900 HL 0.34, therapeutic.   Date INR Dose 0527  1.2.  12 mg 0528  1.1  12 mg   Goal of Therapy:  INR 2-3 Heparin level 0.3-0.7 units/ml Monitor platelets by anticoagulation protocol: Yes   Plan:  Heparin: Heparin therapeutic, no change necessary, continue heparin infusion at 1500 units/hr and will order HL and CBC with AM labs.   Warfarin: INR subtherapeutic, goal range 2-3, will continue warfarin 12 mg daily (home dose). Predict the INR to increase tomorrow, plan to increase dose if INR remains stable. Daily INR ordered. Plan to bridge warfarin and heparin for at least 5 days and until INR is > 2 for 24 hours.   Pharmacy will continue to follow and manage.    Durward Fortes  PharmD Student  01/25/2020,9:48 AM

## 2020-01-25 NOTE — Progress Notes (Signed)
PROGRESS NOTE    Ryan SilversmithCharles A [name]  ZOX:[id number] DOB: December 01, 1970 DOA: 01/24/2020 PCP: Sherrie MustacheJadali, Fayegh, MD    Assessment & Plan:   Principal Problem:   DVT (deep venous thrombosis) (HCC) Active Problems:   Pulmonary emboli (HCC)   Obesity, Class III, BMI 40-49.9 (morbid obesity) (HCC)   Chronic diastolic heart failure (HCC)   HTN (hypertension)   CKD (chronic kidney disease), stage III    Ryan SilversmithCharles A Haydon is a 49 y.o. 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  He is also status post IVC filter insertion.  He presents to the emergency room for evaluation of shortness of breath with exertion for 2 days as well as pain in his right thigh extending to the pelvic area.  He states that the shortness of breath has been constant and severe with no relieving or aggravating factors.  It is also associated with bilateral lower extremity swelling.  He denies having any fever or chills, no cough, no chest pain, no dizziness or lightheadedness.  He denies having abdominal pain or changes in his bowel habits. Patient has not taken his Coumadin in a month because he states that his primary care provider refused to give him any more refills because he has not been going to the office to get his INR checked.   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 Patient has not been in Coumadin for about a month because he states that his primary care provider refused to give him any more refills since he was not showing up for his INR check (patient states that he has not left his house since March 2020 due to the pandemic) He presents for evaluation of shortness of breath and right thigh pain and is noted to have acute on chronic DVT and PE INR on  admission was 1.2 PLAN: --Continue heparin bridge --Pt is interested in switching to Xarelto  Morbid obesity (BMI 54) Complicates overall prognosis and care  Hypertension Continue metoprolol and hydrochlorothiazide  Stage III chronic kidney disease Renal function is stable we will monitor closely during this hospitalization  Chronic diastolic dysfunction CHF Stable and not in acute exacerbation Optimize blood pressure control Continue metoprolol and hydrochlorothiazide   DVT prophylaxis: WJ:XBJYNWGOn:heparin gtt Code Status: Full code  Family Communication: aunt updated at the bedside today Status is: inpatient Dispo:   The patient is from: home Anticipated d/c is to: home Anticipated d/c date is: 1-2 days Patient currently is not medically stable to d/c due to: on heparin gtt due to significant clot burden   Subjective and Interval History:  Pt reported some pain in his thighs, but believed the swelling in his LE's have already improved.  Had dyspnea on exertion.  No fever, chest pain, abdominal pain, N/V/D, dysuria.   Objective: Vitals:   01/25/20 0427 01/25/20 0721 01/25/20 0934 01/25/20 1249  BP: 124/72 (!) 143/87 (!) 141/96 133/83  Pulse: 71 76 79 77  Resp: 20 16  20   Temp: 98.3 F (36.8 C) 98.7 F (37.1 C)  99.1 F (37.3 C)  TempSrc: Oral Oral  Oral  SpO2: 95% 98%  95%  Weight:      Height:        Intake/Output Summary (Last 24 hours) at 01/25/2020 1928 Last  data filed at 01/25/2020 1642 Gross per 24 hour  Intake 240 ml  Output 1080 ml  Net -840 ml   Filed Weights   01/24/20 0823 01/24/20 2339  Weight: (!) 158.8 kg (!) 160.5 kg    Examination:   Constitutional: NAD, AAOx3 HEENT: conjunctivae and lids normal, EOMI CV: RRR no M,R,G. Distal pulses +2.  No cyanosis.   RESP: CTA B/L, normal respiratory effort  GI: +BS, NTND Extremities: swelling in BLE SKIN: warm, dry and intact Neuro: II - XII grossly intact.  Sensation intact Psych: Normal mood and  affect.  Appropriate judgement and reason   Data Reviewed: I have personally reviewed following labs and imaging studies  CBC: Recent Labs  Lab 01/24/20 0829 01/25/20 0200  WBC 6.9 6.0  HGB 15.4 14.0  HCT 47.8 43.1  MCV 93.7 93.5  PLT 169 164   Basic Metabolic Panel: Recent Labs  Lab 01/24/20 0829  NA 137  K 4.1  CL 101  CO2 25  GLUCOSE 123*  BUN 20  CREATININE 1.70*  CALCIUM 8.9   GFR: Estimated Creatinine Clearance: 76.4 mL/min (A) (by C-G formula based on SCr of 1.7 mg/dL (H)). Liver Function Tests: Recent Labs  Lab 01/24/20 0829  AST 16  ALT 21  ALKPHOS 64  BILITOT 1.4*  PROT 8.6*  ALBUMIN 4.4   No results for input(s): LIPASE, AMYLASE in the last 168 hours. No results for input(s): AMMONIA in the last 168 hours. Coagulation Profile: Recent Labs  Lab 01/24/20 0829 01/25/20 0200  INR 1.2 1.1   Cardiac Enzymes: No results for input(s): CKTOTAL, CKMB, CKMBINDEX, TROPONINI in the last 168 hours. BNP (last 3 results) No results for input(s): PROBNP in the last 8760 hours. HbA1C: No results for input(s): HGBA1C in the last 72 hours. CBG: No results for input(s): GLUCAP in the last 168 hours. Lipid Profile: No results for input(s): CHOL, HDL, LDLCALC, TRIG, CHOLHDL, LDLDIRECT in the last 72 hours. Thyroid Function Tests: No results for input(s): TSH, T4TOTAL, FREET4, T3FREE, THYROIDAB in the last 72 hours. Anemia Panel: No results for input(s): VITAMINB12, FOLATE, FERRITIN, TIBC, IRON, RETICCTPCT in the last 72 hours. Sepsis Labs: No results for input(s): PROCALCITON, LATICACIDVEN in the last 168 hours.  Recent Results (from the past 240 hour(s))  SARS Coronavirus 2 by RT PCR (hospital order, performed in [facility] hospital lab) Nasopharyngeal Nasopharyngeal Swab     Status: None   Collection Time: 01/24/20 10:53 AM   Specimen: Nasopharyngeal Swab  Result Value Ref Range Status   SARS Coronavirus 2 NEGATIVE NEGATIVE Final    Comment:  (NOTE) SARS-CoV-2 target nucleic acids are NOT DETECTED. The SARS-CoV-2 RNA is generally detectable in upper and lower respiratory specimens during the acute phase of infection. The lowest concentration of SARS-CoV-2 viral copies this assay can detect is 250 copies / mL. A negative result does not preclude SARS-CoV-2 infection and should not be used as the sole basis for treatment or other patient management decisions.  A negative result may occur with improper specimen collection / handling, submission of specimen other than nasopharyngeal swab, presence of viral mutation(s) within the areas targeted by this assay, and inadequate number of viral copies (<250 copies / mL). A negative result must be combined with clinical observations, patient history, and epidemiological information. Fact Sheet for Patients:   BoilerBrush.com.cy Fact Sheet for Healthcare Providers: https://pope.com/ This test is not yet approved or cleared  by the [place] FDA and has been authorized for detection and/or  diagnosis of SARS-CoV-2 by FDA under an Emergency Use Authorization (EUA).  This EUA will remain in effect (meaning this test can be used) for the duration of the COVID-19 declaration under Section 564(b)(1) of the Act, 21 U.S.C. section 360bbb-3(b)(1), unless the authorization is terminated or revoked sooner. Performed at [facility], [address], [place], [place] [address]       Radiology Studies: DG Chest 2 View  Result Date: 01/24/2020 CLINICAL DATA:  Shortness of breath. EXAM: CHEST - 2 VIEW COMPARISON:  08/08/2017 FINDINGS: Cardiomediastinal contours and hilar structures are normal. Lungs are clear. Visualized bony structures with limited assessment. No acute findings. IMPRESSION: No acute cardiopulmonary disease. Electronically Signed   By: Donzetta Kohut M.D.   On: 01/24/2020 08:49   CT Angio Chest PE W and/or Wo  Contrast  Result Date: 01/24/2020 CLINICAL DATA:  Shortness of breath EXAM: CT ANGIOGRAPHY CHEST WITH CONTRAST TECHNIQUE: Multidetector CT imaging of the chest was performed using the standard protocol during bolus administration of intravenous contrast. Multiplanar CT image reconstructions and MIPs were obtained to evaluate the vascular anatomy. CONTRAST:  73mL OMNIPAQUE IOHEXOL 350 MG/ML SOLN COMPARISON:  Chest radiograph Jan 24, 2020; CT angiogram chest August 03, 2017 FINDINGS: Cardiovascular: There is pulmonary embolus extending from along the anterior aspect of the right main pulmonary artery into several right lower lobe pulmonary artery branches. In comparison with the 2018 study, the embolic changes in the right main pulmonary artery may well be chronic given the somewhat irregular contour of the embolus. There are multiple right lower lobe pulmonary emboli with a greater degree of burden of pulmonary embolus in the right lower lobe pulmonary artery vessels compared to the previous study from 2018. Question a degree of acute as well as chronic pulmonary embolus on the right. Previous pulmonary emboli on the left have resolved since the 2018 study. There is no appreciable right heart strain. No thoracic aortic aneurysm or dissection. Visualized great vessels appear unremarkable. Note that the right innominate and left common carotid arteries arise as a common trunk, an anatomic variant. No pericardial effusion or pericardial thickening is evident. The main pulmonary outflow tract measures 3.6 cm in diameter, enlarged. Mediastinum/Nodes: Visualized thyroid is unremarkable. There are scattered subcentimeter mediastinal lymph nodes. There is no adenopathy by size criteria. There is a small hiatal hernia. Lungs/Pleura: There are scattered areas of atelectatic change. No edema or airspace opacity. No appreciable pleural effusions. Upper Abdomen: There is incomplete visualization of a filter in the inferior  vena cava. There is hepatic steatosis. Visualized upper abdominal structures otherwise appear unremarkable. Musculoskeletal: There is degenerative change in the lower thoracic spine. No blastic or lytic bone lesions are evident. No chest wall lesions appreciable. Review of the MIP images confirms the above findings. IMPRESSION: 1. In comparison with the prior study from 2018, there remains pulmonary embolus in the right main pulmonary artery which appears similar in distribution and appears slightly irregular in contour. 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 2. There has been an increase in pulmonary embolus burden in the lower lobe pulmonary arteries on the right compared to the previous study. While these changes could represent chronic pulmonary embolus which occurred in the interval between the 2018 study and current study, acute pulmonary embolus superimposed on chronic pulmonary embolus must be of concern in the right lower lobe pulmonary artery branches given the change from the most recent available study. 3. Interval clearing of pulmonary embolus from the left lower lobe compared to  previous study. 4. Enlargement of the main pulmonary outflow tract consistent with pulmonary are per tension. 5.  Scattered areas of atelectasis.  No edema or airspace opacity. 6.  No evident adenopathy. 7.  Small hiatal hernia. 8.  Incomplete visualization of a filter in the inferior vena cava. 9.  Hepatic steatosis. Critical Value/emergent results were called by telephone at the time of interpretation on 01/24/2020 at 10:44 am to provider [facility] , who verbally acknowledged these results. Electronically Signed   By: Lowella Grip III M.D.   On: 01/24/2020 10:44   US Venous Img Lower Bilateral  Result Date: 01/24/2020 CLINICAL DATA:  Worsening bilateral lower extremity pain and edema. History of previous DVT. Evaluate for acute or chronic DVT. EXAM: BILATERAL LOWER EXTREMITY VENOUS DOPPLER ULTRASOUND  TECHNIQUE: Gray-scale sonography with graded compression, as well as color Doppler and duplex ultrasound were performed to evaluate the lower extremity deep venous systems from the level of the common femoral vein and including the common femoral, femoral, profunda femoral, popliteal and calf veins including the posterior tibial, peroneal and gastrocnemius veins when visible. The superficial great saphenous vein was also interrogated. Spectral Doppler was utilized to evaluate flow at rest and with distal augmentation maneuvers in the common femoral, femoral and popliteal veins. COMPARISON:  Bilateral lower extremity venous Doppler ultrasound-08/04/2017 (chronic thrombus noted within the bilateral femoral and popliteal veins) FINDINGS: RIGHT LOWER EXTREMITY There is hypoechoic occlusive thrombus within the right common femoral vein (image 36, extending to involve the saphenofemoral junction (image 38), not seen on the 12/20 18 examination. The right deep femoral vein appears patent where imaged. There is hypoechoic occlusive thrombus involving the proximal (image 44), mid (image 47) and distal (image 50) aspects of the right femoral vein, progressed compared to the 2018 examination. There is hypoechoic occlusive thrombus involving the proximal (image 54 and distal (image 56) aspects of the right popliteal vein. The tibial veins are poorly visualized. Other Findings:  None. _________________________________________________________ LEFT LOWER EXTREMITY Common Femoral Vein: No evidence of thrombus. Normal compressibility, respiratory phasicity and response to augmentation. Saphenofemoral Junction: No evidence of thrombus. Normal compressibility and flow on color Doppler imaging. Profunda Femoral Vein: No evidence of thrombus. Normal compressibility and flow on color Doppler imaging. Femoral Vein: While there is no definitive evidence of acute or chronic DVT involving the proximal and mid aspects of the left femoral  vein, there is hypoechoic occlusive thrombus involving the distal aspects of both paired femoral veins (images 20 through 22). Popliteal Vein: Hypoechoic occlusive thrombus seen involving the proximal (image 26) and distal (image 29) aspects of the left femoral vein, similar to the 2018 examination. Calf Veins: No evidence of thrombus. Normal compressibility and flow on color Doppler imaging. Superficial Great Saphenous Vein: No evidence of thrombus. Normal compressibility. Other Findings:  None. IMPRESSION: 1. Acute on chronic, predominantly occlusive DVT extending from the right common femoral vein through the right popliteal vein, progressed compared to the 2018 examination. 2. Occlusive DVT involving the distal aspects of both paired left femoral veins extending to involve the left popliteal vein - while the clot burden within the left lower extremity is improved compared to the 2018 examination, in the absence of more recent prior examinations, an acute on chronic process is not excluded. Electronically Signed   By: Sandi Mariscal M.D.   On: 01/24/2020 10:23     Scheduled Meds: . atorvastatin  10 mg Oral Daily  . hydrochlorothiazide  25 mg Oral Daily  . metoprolol tartrate  25 mg Oral BID  . multivitamin with minerals  1 tablet Oral Daily  . pantoprazole  40 mg Oral Daily  . warfarin  12 mg Oral q1800  . Warfarin - Pharmacist Dosing Inpatient   Does not apply q1600   Continuous Infusions: . heparin 1,500 Units/hr (01/25/20 0254)     LOS: 1 day     Darlin Priestly, MD [facility] If 7PM-7AM, please contact night-coverage 01/25/2020, 7:28 PM

## 2020-01-25 NOTE — Consult Note (Signed)
ANTICOAGULATION CONSULT NOTE -   Pharmacy Consult for Heparin Infusion Indication: pulmonary embolus  Allergies  Allergen Reactions  . Clindamycin/Lincomycin Rash  . Sulfa Antibiotics Rash   Patient Measurements: Height: 5\' 7"  (170.2 cm) Weight: (!) 160.5 kg (353 lb 13.4 oz) IBW/kg (Calculated) : 66.1 Heparin Dosing Weight: 105.5 kg  Vital Signs: Temp: 99.6 F (37.6 C) (05/27 2339) Temp Source: Oral (05/27 2339) BP: 128/84 (05/27 2339) Pulse Rate: 87 (05/27 2339)  Labs: Recent Labs    01/24/20 0829 01/24/20 1250 01/24/20 2005 01/25/20 0200  HGB 15.4  --   --  14.0  HCT 47.8  --   --  43.1  PLT 169  --   --  164  APTT 27  --   --   --   LABPROT 14.6  --   --   --   INR 1.2  --   --   --   HEPARINUNFRC  --  <0.10* 0.36 0.30  CREATININE 1.70*  --   --   --   TROPONINIHS 7  --   --   --     Estimated Creatinine Clearance: 76.4 mL/min (A) (by C-G formula based on SCr of 1.7 mg/dL (H)).   Medical History: Past Medical History:  Diagnosis Date  . CHF (congestive heart failure) ([place])   . Diabetes mellitus without complication ([place])    per pt-pre diabetic-Dr Rosario Jacks stated said pt  . DVT (deep venous thrombosis) ([place])   . Hypertension   . Peripheral vascular disease ([place])   . Presence of IVC filter     Medications:  Medications Prior to Admission  Medication Sig Dispense Refill Last Dose  . atorvastatin (LIPITOR) 10 MG tablet atorvastatin 10 mg tablet  TK 1 T PO QD   Past Month at Unknown time  . b complex vitamins capsule Take by mouth.   Past Month at Unknown time  . warfarin (COUMADIN) 6 MG tablet Take 2 tablets (12 mg total) by mouth daily at 6 PM. 60 tablet 0 60+ days at Unknown time  . benzonatate (TESSALON) 100 MG capsule benzonatate 100 mg capsule  TK 1 C PO TID   Not Taking at Unknown time  . calcium acetate (PHOSLO) 667 MG capsule calcium acetate(phosphate binders) 667 mg capsule   Not Taking at Unknown time  . Docusate Sodium (DSS) 100 MG CAPS  Doc-Q-Lace 100 mg capsule  TK 1 C PO BID PRN   Not Taking at Unknown time  . fluticasone (FLONASE) 50 MCG/ACT nasal spray Place 2 sprays into both nostrils 2 (two) times daily. (Patient not taking: Reported on 01/24/2020) 16 g 0 Not Taking  . hydrochlorothiazide (HYDRODIURIL) 25 MG tablet hydrochlorothiazide 25 mg tablet     . metoprolol tartrate (LOPRESSOR) 25 MG tablet metoprolol tartrate 25 mg tablet  TK 1 T PO  BID FOR BP     . ondansetron (ZOFRAN) 4 MG tablet ondansetron HCl 4 mg tablet  TK 1 T PO Q 8 H     . ondansetron (ZOFRAN) 8 MG tablet ondansetron HCl 8 mg tablet     . pantoprazole (PROTONIX) 40 MG tablet pantoprazole 40 mg tablet,delayed release     . polyethylene glycol powder (GLYCOLAX/MIRALAX) 17 GM/SCOOP powder polyethylene glycol 3350 17 gram/dose oral powder     . traMADol (ULTRAM) 50 MG tablet tramadol 50 mg tablet      Scheduled:  . atorvastatin  10 mg Oral Daily  . hydrochlorothiazide  25 mg Oral Daily  .  metoprolol tartrate  25 mg Oral BID  . multivitamin with minerals  1 tablet Oral Daily  . pantoprazole  40 mg Oral Daily  . warfarin  12 mg Oral q1800  . Warfarin - Physician Dosing Inpatient   Does not apply q1600   Infusions:  . heparin 1,400 Units/hr (01/24/20 1637)   PRN:  Anti-infectives (From admission, onward)   None      Assessment: Pharmacy has been consulted to initiate Heparin Infusion in 49yo patient with history of PE, DVTs,  And IVC filter placement who is on warfarin PTA but has been noncompliant for the past month. Was previously on apixaban but had same non-compliance issue.  CT confirmed PE in right pulmonary artery. Baseline labs: INR 1.2, APTT 27sec, HL<0.1, Hgb 15.4, PLTs 169.  0527 2005 HL 0.36, therapeutic x 1 0528 0200 HL 0.30, therapeutic x 2 but on low end and trending down, CBC stable  Goal of Therapy:  Heparin level 0.3-0.7 units/ml Monitor platelets by anticoagulation protocol: Yes   Plan:  Will increase heparin infusion to  1500 units/hr and recheck HL in 6 hours Check anti-Xa level and CBC daily.   Continue to monitor H&H and platelets  Margo Aye, Scott A 01/25/2020,2:44 AM

## 2020-01-26 LAB — CBC
HCT: 45.1 % (ref 39.0–52.0)
Hemoglobin: 14.9 g/dL (ref 13.0–17.0)
MCH: 30.4 pg (ref 26.0–34.0)
MCHC: 33 g/dL (ref 30.0–36.0)
MCV: 92 fL (ref 80.0–100.0)
Platelets: 205 10*3/uL (ref 150–400)
RBC: 4.9 MIL/uL (ref 4.22–5.81)
RDW: 14.6 % (ref 11.5–15.5)
WBC: 6.4 10*3/uL (ref 4.0–10.5)
nRBC: 0 % (ref 0.0–0.2)

## 2020-01-26 LAB — BASIC METABOLIC PANEL
Anion gap: 10 (ref 5–15)
BUN: 23 mg/dL — ABNORMAL HIGH (ref 6–20)
CO2: 24 mmol/L (ref 22–32)
Calcium: 9 mg/dL (ref 8.9–10.3)
Chloride: 103 mmol/L (ref 98–111)
Creatinine, Ser: 1.61 mg/dL — ABNORMAL HIGH (ref 0.61–1.24)
GFR calc Af Amer: 57 mL/min — ABNORMAL LOW (ref >60)
GFR calc non Af Amer: 49 mL/min — ABNORMAL LOW (ref >60)
Glucose, Bld: 114 mg/dL — ABNORMAL HIGH (ref 70–99)
Potassium: 4.3 mmol/L (ref 3.5–5.1)
Sodium: 137 mmol/L (ref 135–145)

## 2020-01-26 LAB — MAGNESIUM: Magnesium: 1.9 mg/dL (ref 1.7–2.4)

## 2020-01-26 LAB — PROTIME-INR
INR: 1.3 — ABNORMAL HIGH (ref 0.8–1.2)
Prothrombin Time: 15.4 seconds — ABNORMAL HIGH (ref 11.4–15.2)

## 2020-01-26 LAB — HEPARIN LEVEL (UNFRACTIONATED): Heparin Unfractionated: 0.38 IU/mL (ref 0.30–0.70)

## 2020-01-26 MED ORDER — DOCUSATE SODIUM 100 MG PO CAPS: 100.0000 mg | ORAL_CAPSULE | Freq: Two times a day (BID) | ORAL | Status: DC | PRN

## 2020-01-26 MED ORDER — ALUM & MAG HYDROXIDE-SIMETH 200-200-20 MG/5ML PO SUSP: 15.0000 mL | Freq: Four times a day (QID) | ORAL | Status: DC | PRN

## 2020-01-26 MED ORDER — GUAIFENESIN-DM 100-10 MG/5ML PO SYRP: 10.0000 mL | ORAL_SOLUTION | Freq: Four times a day (QID) | ORAL | Status: DC | PRN

## 2020-01-26 MED ORDER — RIVAROXABAN 20 MG PO TABS: 20.0000 mg | ORAL_TABLET | Freq: Every day | ORAL | Status: DC

## 2020-01-26 MED ORDER — RIVAROXABAN 15 MG PO TABS
15.0000 mg | ORAL_TABLET | Freq: Two times a day (BID) | ORAL | Status: DC
  Administered 2020-01-26 – 2020-01-27 (×3): 15 mg via ORAL
  Filled 2020-01-26 (×5): qty 1

## 2020-01-26 MED ORDER — ACETAMINOPHEN 500 MG PO TABS: 1000.0000 mg | ORAL_TABLET | Freq: Three times a day (TID) | ORAL | Status: DC | PRN

## 2020-01-26 MED ORDER — CALCIUM CARBONATE ANTACID 500 MG PO CHEW: 1.0000 | CHEWABLE_TABLET | Freq: Three times a day (TID) | ORAL | Status: DC | PRN

## 2020-01-26 MED ORDER — POLYETHYLENE GLYCOL 3350 17 G PO PACK: 17.0000 g | PACK | Freq: Two times a day (BID) | ORAL | Status: DC | PRN

## 2020-01-26 NOTE — Progress Notes (Signed)
PROGRESS NOTE    Ryan [name]  ZHY:[id number] DOB: 1971/07/05 DOA: 01/24/2020 PCP: Casilda Carls, MD    Assessment & Plan:   Principal Problem:   DVT (deep venous thrombosis) ([place]) Active Problems:   Pulmonary emboli (HCC)   Obesity, Class III, BMI 40-49.9 (morbid obesity) (HCC)   Chronic diastolic heart failure (HCC)   HTN (hypertension)   CKD (chronic kidney disease), stage III    Ryan [name] is a 49 y.o. AA male with medical history significant for venous thromboembolic disease (patient has a history of recurrent PE and DVTs) and is supposed to be on chronic anticoagulation therapy with Coumadin.  He is also status post IVC filter insertion.  He presents to the emergency room for evaluation of shortness of breath with exertion for 2 days as well as pain in his right thigh extending to the pelvic area.  He states that the shortness of breath has been constant and severe with no relieving or aggravating factors.  It is also associated with bilateral lower extremity swelling.  He denies having any fever or chills, no cough, no chest pain, no dizziness or lightheadedness.  He denies having abdominal pain or changes in his bowel habits. Patient has not taken his Coumadin in a month because he states that his primary care provider refused to give him any more refills because he has not been going to the office to get his INR checked.   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 Patient has not been in Coumadin for about a month because he states that his primary care provider refused to give him any more refills since he was not showing up for his INR check (patient states that he has not left his house since March 2020 due to the pandemic) He presents for evaluation of shortness of breath and right thigh pain and is noted to have acute on chronic DVT and PE INR on  admission was 1.2 PLAN: --transition to Xarelto today, per pt preference  Morbid obesity (BMI 54) Complicates overall prognosis and care  Hypertension Continue metoprolol and hydrochlorothiazide  Stage III chronic kidney disease Renal function is stable we will monitor closely during this hospitalization  Chronic diastolic dysfunction CHF Stable and not in acute exacerbation Optimize blood pressure control Continue metoprolol and hydrochlorothiazide   DVT prophylaxis: [id number]  Code Status: Full code  Family Communication:  Status is: inpatient Dispo:   The patient is from: home Anticipated d/c is to: home Anticipated d/c date is: tomorrow Patient currently is not medically stable to d/c due to: Just transitioned to Xarelto.  Due to the large clot burden and pt having severe dyspnea with minimum ambulation, will monitor for another night.   Subjective and Interval History:  Pt reported pain and swelling in thighs improved, however, still felt very dyspneic just walking to bathroom.  No fever, chest pain, abdominal pain, N/V/D, dysuria.    Objective: Vitals:   01/25/20 2046 01/26/20 0518 01/26/20 1042 01/26/20 1212  BP: (!) 145/90 134/82 (!) 133/91 (!) 156/91  Pulse: 79 79 92 88  Resp: 16 20  20   Temp: 98.8 F (37.1 C) 98.7 F (37.1 C)  99.2 F (37.3 C)  TempSrc: Oral Oral  Oral  SpO2: 98% 91%  90%  Weight:      Height:        Intake/Output Summary (Last 24  hours) at 01/26/2020 1449 Last data filed at 01/26/2020 0518 Gross per 24 hour  Intake -  Output 380 ml  Net -380 ml   Filed Weights   01/24/20 0823 01/24/20 2339  Weight: (!) 158.8 kg (!) 160.5 kg    Examination:   Constitutional: NAD, AAOx3 HEENT: conjunctivae and lids normal, EOMI CV: RRR no M,R,G. Distal pulses +2.  No cyanosis.   RESP: CTA B/L, normal respiratory effort  GI: +BS, NTND Extremities: swelling in BLE SKIN: warm, dry and intact Neuro: II - XII grossly intact.  Sensation  intact Psych: Normal mood and affect.  Appropriate judgement and reason   Data Reviewed: I have personally reviewed following labs and imaging studies  CBC: Recent Labs  Lab 01/24/20 0829 01/25/20 0200 01/26/20 0753  WBC 6.9 6.0 6.4  HGB 15.4 14.0 14.9  HCT 47.8 43.1 45.1  MCV 93.7 93.5 92.0  PLT 169 164 205   Basic Metabolic Panel: Recent Labs  Lab 01/24/20 0829 01/26/20 0753  NA 137 137  K 4.1 4.3  CL 101 103  CO2 25 24  GLUCOSE 123* 114*  BUN 20 23*  CREATININE 1.70* 1.61*  CALCIUM 8.9 9.0  MG  --  1.9   GFR: Estimated Creatinine Clearance: 80.7 mL/min (A) (by C-G formula based on SCr of 1.61 mg/dL (H)). Liver Function Tests: Recent Labs  Lab 01/24/20 0829  AST 16  ALT 21  ALKPHOS 64  BILITOT 1.4*  PROT 8.6*  ALBUMIN 4.4   No results for input(s): LIPASE, AMYLASE in the last 168 hours. No results for input(s): AMMONIA in the last 168 hours. Coagulation Profile: Recent Labs  Lab 01/24/20 0829 01/25/20 0200 01/26/20 0753  INR 1.2 1.1 1.3*   Cardiac Enzymes: No results for input(s): CKTOTAL, CKMB, CKMBINDEX, TROPONINI in the last 168 hours. BNP (last 3 results) No results for input(s): PROBNP in the last 8760 hours. HbA1C: No results for input(s): HGBA1C in the last 72 hours. CBG: No results for input(s): GLUCAP in the last 168 hours. Lipid Profile: No results for input(s): CHOL, HDL, LDLCALC, TRIG, CHOLHDL, LDLDIRECT in the last 72 hours. Thyroid Function Tests: No results for input(s): TSH, T4TOTAL, FREET4, T3FREE, THYROIDAB in the last 72 hours. Anemia Panel: No results for input(s): VITAMINB12, FOLATE, FERRITIN, TIBC, IRON, RETICCTPCT in the last 72 hours. Sepsis Labs: No results for input(s): PROCALCITON, LATICACIDVEN in the last 168 hours.  Recent Results (from the past 240 hour(s))  SARS Coronavirus 2 by RT PCR (hospital order, performed in [facility] hospital lab) Nasopharyngeal Nasopharyngeal Swab     Status: None   Collection Time:  01/24/20 10:53 AM   Specimen: Nasopharyngeal Swab  Result Value Ref Range Status   SARS Coronavirus 2 NEGATIVE NEGATIVE Final    Comment: (NOTE) SARS-CoV-2 target nucleic acids are NOT DETECTED. The SARS-CoV-2 RNA is generally detectable in upper and lower respiratory specimens during the acute phase of infection. The lowest concentration of SARS-CoV-2 viral copies this assay can detect is 250 copies / mL. A negative result does not preclude SARS-CoV-2 infection and should not be used as the sole basis for treatment or other patient management decisions.  A negative result may occur with improper specimen collection / handling, submission of specimen other than nasopharyngeal swab, presence of viral mutation(s) within the areas targeted by this assay, and inadequate number of viral copies (<250 copies / mL). A negative result must be combined with clinical observations, patient history, and epidemiological information. Fact Sheet for Patients:  BoilerBrush.com.cy Fact Sheet for Healthcare Providers: https://pope.com/ This test is not yet approved or cleared  by the [place] FDA and has been authorized for detection and/or diagnosis of SARS-CoV-2 by FDA under an Emergency Use Authorization (EUA).  This EUA will remain in effect (meaning this test can be used) for the duration of the COVID-19 declaration under Section 564(b)(1) of the Act, 21 U.S.C. section 360bbb-3(b)(1), unless the authorization is terminated or revoked sooner. Performed at [facility], [address], [place], [place] [address]       Radiology Studies: No results found.   Scheduled Meds: . atorvastatin  10 mg Oral Daily  . hydrochlorothiazide  25 mg Oral Daily  . metoprolol tartrate  25 mg Oral BID  . multivitamin with minerals  1 tablet Oral Daily  . pantoprazole  40 mg Oral Daily  . Rivaroxaban  15 mg Oral BID WC   Followed by  . [START  ON 02/16/2020] rivaroxaban  20 mg Oral Q supper   Continuous Infusions:    LOS: 2 days     Darlin Priestly, MD [facility] If 7PM-7AM, please contact night-coverage 01/26/2020, 2:49 PM

## 2020-01-26 NOTE — Consult Note (Signed)
ANTICOAGULATION CONSULT NOTE   Pharmacy Consult for Heparin and Warfarin  Indication: pulmonary embolus and DVT treatment  Allergies  Allergen Reactions  . Clindamycin/Lincomycin Rash  . Sulfa Antibiotics Rash    Patient Measurements: Height: 5\' 7"  (170.2 cm) Weight: (!) 160.5 kg (353 lb 13.4 oz) IBW/kg (Calculated) : 66.1 Heparin Dosing Weight: 106 kg  Vital Signs: Temp: 98.7 F (37.1 C) (05/29 0518) Temp Source: Oral (05/29 0518) BP: 134/82 (05/29 0518) Pulse Rate: 79 (05/29 0518)  Labs: Recent Labs    01/24/20 0829 01/24/20 1250 01/25/20 0200 01/25/20 0900 01/26/20 0753  HGB 15.4  --  14.0  --  14.9  HCT 47.8  --  43.1  --  45.1  PLT 169  --  164  --  205  APTT 27  --   --   --   --   LABPROT 14.6  --  14.1  --  15.4*  INR 1.2  --  1.1  --  1.3*  HEPARINUNFRC  --    < > 0.30 0.34 0.38  CREATININE 1.70*  --   --   --  1.61*  TROPONINIHS 7  --   --   --   --    < > = values in this interval not displayed.    Estimated Creatinine Clearance: 80.7 mL/min (A) (by C-G formula based on SCr of 1.61 mg/dL (H)).   Medical History: Past Medical History:  Diagnosis Date  . CHF (congestive heart failure) ([facility])   . Diabetes mellitus without complication (H[facility])    per pt-pre diabetic-Dr 01/28/20 stated said pt  . DVT (deep venous thrombosis) ([facility])   . Hypertension   . Peripheral vascular disease ([facility])   . Presence of IVC filter     Medications:  Medications Prior to Admission  Medication Sig Dispense Refill Last Dose  . atorvastatin (LIPITOR) 10 MG tablet atorvastatin 10 mg tablet  TK 1 T PO QD   Past Month at Unknown time  . b complex vitamins capsule Take by mouth.   Past Month at Unknown time  . warfarin (COUMADIN) 6 MG tablet Take 2 tablets (12 mg total) by mouth daily at 6 PM. 60 tablet 0 60+ days at Unknown time  . benzonatate (TESSALON) 100 MG capsule benzonatate 100 mg capsule  TK 1 C PO TID   Not Taking at Unknown time  . calcium acetate (PHOSLO) 667  MG capsule calcium acetate(phosphate binders) 667 mg capsule   Not Taking at Unknown time  . Docusate Sodium (DSS) 100 MG CAPS Doc-Q-Lace 100 mg capsule  TK 1 C PO BID PRN   Not Taking at Unknown time  . fluticasone (FLONASE) 50 MCG/ACT nasal spray Place 2 sprays into both nostrils 2 (two) times daily. (Patient not taking: Reported on 01/24/2020) 16 g 0 Not Taking  . hydrochlorothiazide (HYDRODIURIL) 25 MG tablet hydrochlorothiazide 25 mg tablet     . metoprolol tartrate (LOPRESSOR) 25 MG tablet metoprolol tartrate 25 mg tablet  TK 1 T PO  BID FOR BP     . ondansetron (ZOFRAN) 4 MG tablet ondansetron HCl 4 mg tablet  TK 1 T PO Q 8 H     . ondansetron (ZOFRAN) 8 MG tablet ondansetron HCl 8 mg tablet     . pantoprazole (PROTONIX) 40 MG tablet pantoprazole 40 mg tablet,delayed release     . polyethylene glycol powder (GLYCOLAX/MIRALAX) 17 GM/SCOOP powder polyethylene glycol 3350 17 gram/dose oral powder     . traMADol (  ULTRAM) 50 MG tablet tramadol 50 mg tablet      Scheduled:  . atorvastatin  10 mg Oral Daily  . hydrochlorothiazide  25 mg Oral Daily  . metoprolol tartrate  25 mg Oral BID  . multivitamin with minerals  1 tablet Oral Daily  . pantoprazole  40 mg Oral Daily  . Rivaroxaban  15 mg Oral BID WC   Followed by  . [START ON 02/16/2020] rivaroxaban  20 mg Oral Q supper   Infusions:  . heparin 1,500 Units/hr (01/26/20 0630)   PRN: hydrALAZINE, ondansetron **OR** ondansetron (ZOFRAN) IV, traMADol Anti-infectives (From admission, onward)   None      Assessment: Pharmacy has been consulted to initiate Heparin Infusion and warfarin dosing in 49yo patient with history of PE, DVTs,  and IVC filter placement who was on warfarin PTA but has been noncompliant for the past month. Was previously on apixaban but had same non-compliance issue. CT confirmed PE in right pulmonary artery and ultrasound findings of acute on chronic, predominantly occlusive DVT. Baseline labs: INR 1.2, APTT 27sec,  HL<0.1, Hgb 15.4, PLTs 169.  0527 2005 HL 0.36, therapeutic x 1 0528 0200 HL 0.30, therapeutic x 2 but on low end and trending down, CBC stable 0528 0900 HL 0.34, therapeutic.  0529 0753 HL 0.38, therapetuic  Date INR Dose 0527  1.2.  12 mg 0528  1.1  12 mg  0529  1.3  Goal of Therapy:  INR 2-3 Heparin level 0.3-0.7 units/ml Monitor platelets by anticoagulation protocol: Yes   Plan:  Heparin: Heparin therapeutic, no change necessary, continue heparin infusion at 1500 units/hr and will order HL and CBC with AM labs.   Warfarin: INR subtherapeutic, goal range 2-3, will continue warfarin 12 mg daily (home dose).  Plan to increase dose if INR slow to move. Daily INR ordered.  Plan to bridge warfarin and heparin for at least 5 days and until INR is > 2 for 24 hours.   Plan to switch to Xarelto?  Pharmacy will continue to follow and manage.    Chinita Greenland PharmD Clinical Pharmacist 01/26/2020

## 2020-01-27 LAB — MAGNESIUM: Magnesium: 2 mg/dL (ref 1.7–2.4)

## 2020-01-27 LAB — BASIC METABOLIC PANEL
Anion gap: 10 (ref 5–15)
BUN: 26 mg/dL — ABNORMAL HIGH (ref 6–20)
CO2: 25 mmol/L (ref 22–32)
Calcium: 9 mg/dL (ref 8.9–10.3)
Chloride: 101 mmol/L (ref 98–111)
Creatinine, Ser: 1.39 mg/dL — ABNORMAL HIGH (ref 0.61–1.24)
GFR calc Af Amer: >60 mL/min (ref >60)
GFR calc non Af Amer: 59 mL/min — ABNORMAL LOW (ref >60)
Glucose, Bld: 107 mg/dL — ABNORMAL HIGH (ref 70–99)
Potassium: 4 mmol/L (ref 3.5–5.1)
Sodium: 136 mmol/L (ref 135–145)

## 2020-01-27 LAB — CBC
HCT: 42.5 % (ref 39.0–52.0)
Hemoglobin: 13.8 g/dL (ref 13.0–17.0)
MCH: 29.6 pg (ref 26.0–34.0)
MCHC: 32.5 g/dL (ref 30.0–36.0)
MCV: 91.2 fL (ref 80.0–100.0)
Platelets: 218 10*3/uL (ref 150–400)
RBC: 4.66 MIL/uL (ref 4.22–5.81)
RDW: 14.5 % (ref 11.5–15.5)
WBC: 5.7 10*3/uL (ref 4.0–10.5)
nRBC: 0 % (ref 0.0–0.2)

## 2020-01-27 MED ORDER — METOPROLOL TARTRATE 25 MG PO TABS: 25.0000 mg | ORAL_TABLET | Freq: Two times a day (BID) | ORAL | 0 refills | Status: DC

## 2020-01-27 MED ORDER — TRAMADOL HCL 50 MG PO TABS: 50.0000 mg | ORAL_TABLET | Freq: Four times a day (QID) | ORAL | 0 refills | Status: AC | PRN

## 2020-01-27 MED ORDER — BENZONATATE 100 MG PO CAPS: ORAL_CAPSULE | ORAL | 0 refills | Status: DC

## 2020-01-27 MED ORDER — RIVAROXABAN 20 MG PO TABS: 20.0000 mg | ORAL_TABLET | Freq: Every day | ORAL | 1 refills | Status: DC

## 2020-01-27 MED ORDER — RIVAROXABAN (XARELTO) VTE STARTER PACK (15 & 20 MG)
ORAL_TABLET | ORAL | 0 refills | Status: DC
Start: 2020-01-27 — End: 2020-01-27

## 2020-01-27 MED ORDER — HYDROCHLOROTHIAZIDE 25 MG PO TABS: 25.0000 mg | ORAL_TABLET | Freq: Every day | ORAL | 0 refills | Status: DC

## 2020-01-27 MED ORDER — RIVAROXABAN (XARELTO) VTE STARTER PACK (15 & 20 MG)
ORAL_TABLET | ORAL | 0 refills | Status: DC
Start: 2020-01-27 — End: 2021-06-07

## 2020-01-27 MED ORDER — ONDANSETRON HCL 4 MG PO TABS: ORAL_TABLET | ORAL | 0 refills | Status: DC

## 2020-01-27 NOTE — Discharge Summary (Signed)
Physician Discharge Summary   Ryan [name]  male DOB: 15-Jan-1971  UEA:[id number]  PCP: Sherrie MustacheJadali, Fayegh, MD  Admit date: 01/24/2020 Discharge date: 01/27/2020  Admitted From: home Disposition:  home CODE STATUS: Full code  Discharge Instructions    Diet - low sodium heart healthy   Complete by: As directed    Discharge instructions   Complete by: As directed    Since you can not make it to coumadin clinic for INR checks, we can not continue your warfarin.  Per your request, we have switched you to Xarelto.  I have ordered you the starter pack, please follow instruction on the prescription.  After you finish the starter pack, you will take Xarelto 20 mg once daily.  I have prescribed you 3 months of Xarelto.  Please follow up with your outpatient doctor (PCP) to continue the prescription.    Your blood pressure have been high, so you have been receiving blood pressure medications hydrochlorothiazide and metoprolol during your hospitalization.  I have prescribed you both for 1 month.  Please follow up with your PCP to see if you need to continue.   Dr. Darlin Priestlyina Hazel Leveille - -   Increase activity slowly   Complete by: As directed        Hospital Course:  For full details, please see H&P, progress notes, consult notes and ancillary notes.  Briefly,  Ryan Popeharles A Leeis a 49 y.o.AA malewith medical history significant forvenous thromboembolic disease(recurrent PE and DVTs)and was supposed to be on chronic anticoagulation therapy with Coumadin. He is also status post IVC filter insertion.He presented to the emergency room for evaluation of shortness of breath with exertion for 2 days as well as pain in his right thigh extending to the pelvic area. Pt also had bilateral lower extremity swelling.   Patient has not taken his Coumadin in a month because he stated that his primary care provider refused to give him any more refills because he has not been going to the office to get his INR  checked due to COVID isolation.   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    Patient had bilateral lower extremity DVT study done which showed acute on chronic, predominantly occlusive DVT extending from the right common femoral vein through the right popliteal vein, progressed compared to the 2018 examination.  Also sown were occlusive DVT involving the distal aspects of both paired left femoral veins extending to involve the left popliteal vein.  Heparin gtt was started on presentation.  Pt was not resumed on Warfarin because he is not vaccinated against COVID, and is still not planning on going to clinic to get INR checks.  Pt said he was tried on Eliquis in the past but developed DVT while on it.  Pt decided that he would like to try Xarelto so he was transitioned from heparin gtt to Xarelto and monitored for 1 more day before discharge.  The price of Xarelto was checked and was ~$40 per month with pt's insurance.    Pt had dyspnea on minimum exertion, likely due to the clot burden in his lungs, which are expected to improve with time with anticoagulation.  Morbid obesity(BMI 54) Complicates overall prognosis and care  Hypertension Pt's BP have been high during hospitalization.  Pt was started on Metoprolol and hydrochlorothiazide and continued after discharge,  to be followed up with PCP for further adjustment.   Stage III chronic kidney disease, stable  Chronic diastolic dysfunction CHF Stable and not in acute exacerbation.  Continued metoprolol and hydrochlorothiazide   Discharge Diagnoses:  Principal Problem:   DVT (deep venous thrombosis) (HCC) Active Problems:   Pulmonary emboli (HCC)   Obesity, Class III, BMI 40-49.9 (morbid obesity) (HCC)   Chronic diastolic heart failure (HCC)   HTN (hypertension)   CKD  (chronic kidney disease), stage III    Discharge Instructions:  Allergies as of 01/27/2020      Reactions   Clindamycin/lincomycin Rash   Sulfa Antibiotics Rash      Medication List    STOP taking these medications   fluticasone 50 MCG/ACT nasal spray Commonly known as: FLONASE   warfarin 6 MG tablet Commonly known as: COUMADIN     TAKE these medications   atorvastatin 10 MG tablet Commonly known as: LIPITOR atorvastatin 10 mg tablet  TK 1 T PO QD   b complex vitamins capsule Take by mouth.   benzonatate 100 MG capsule Commonly known as: TESSALON benzonatate 100 mg capsule  Take 3 times a day as needed. What changed: See the new instructions.   calcium acetate 667 MG capsule Commonly known as: PHOSLO calcium acetate(phosphate binders) 667 mg capsule   DSS 100 MG Caps Doc-Q-Lace 100 mg capsule  TK 1 C PO BID PRN   hydrochlorothiazide 25 MG tablet Commonly known as: HYDRODIURIL Take 1 tablet (25 mg total) by mouth daily. What changed: See the new instructions.   metoprolol tartrate 25 MG tablet Commonly known as: LOPRESSOR Take 1 tablet (25 mg total) by mouth 2 (two) times daily. What changed: See the new instructions.   ondansetron 4 MG tablet Commonly known as: ZOFRAN ondansetron HCl 4 mg tablet.  Take it 3 times a day as needed. What changed:   See the new instructions.  Another medication with the same name was removed. Continue taking this medication, and follow the directions you see here.   pantoprazole 40 MG tablet Commonly known as: PROTONIX pantoprazole 40 mg tablet,delayed release   polyethylene glycol powder 17 GM/SCOOP powder Commonly known as: GLYCOLAX/MIRALAX polyethylene glycol 3350 17 gram/dose oral powder   Rivaroxaban Stater Pack (15 mg and 20 mg) Commonly known as: XARELTO STARTER PACK Follow package directions: Take one  tablet by mouth twice a day. On day 22, switch to one  tablet once a day. Take with food.     rivaroxaban 20 MG Tabs tablet Commonly known as: XARELTO Take 1 tablet (20 mg total) by mouth daily with supper. New blood thinner. Start taking on: February 16, 2020   traMADol 50 MG tablet Commonly known as: ULTRAM Take 1 tablet (50 mg total) by mouth every 6 (six) hours as needed for up to 5 days. What changed: See the new instructions.       Follow-up Information    Sherrie Mustache, MD. Schedule an appointment as soon as possible for a visit in 1 week(s).   Specialty: Internal Medicine Contact information: [address] [place] [address] [phone]           Allergies  Allergen Reactions  . Clindamycin/Lincomycin Rash  . Sulfa Antibiotics Rash     The results of significant diagnostics from this hospitalization (including imaging, microbiology, ancillary and laboratory) are listed below for reference.   Consultations:   Procedures/Studies: DG Chest 2 View  Result Date: 01/24/2020 CLINICAL DATA:  Shortness of  breath. EXAM: CHEST - 2 VIEW COMPARISON:  08/08/2017 FINDINGS: Cardiomediastinal contours and hilar structures are normal. Lungs are clear. Visualized bony structures with limited assessment. No acute findings. IMPRESSION: No acute cardiopulmonary disease. Electronically Signed   By: Donzetta Kohut M.D.   On: 01/24/2020 08:49   CT Angio Chest PE W and/or Wo Contrast  Result Date: 01/24/2020 CLINICAL DATA:  Shortness of breath EXAM: CT ANGIOGRAPHY CHEST WITH CONTRAST TECHNIQUE: Multidetector CT imaging of the chest was performed using the standard protocol during bolus administration of intravenous contrast. Multiplanar CT image reconstructions and MIPs were obtained to evaluate the vascular anatomy. CONTRAST:  4mL OMNIPAQUE IOHEXOL 350 MG/ML SOLN COMPARISON:  Chest radiograph Jan 24, 2020; CT angiogram chest August 03, 2017 FINDINGS: Cardiovascular: There is pulmonary embolus extending from along the anterior aspect of the right main pulmonary artery  into several right lower lobe pulmonary artery branches. In comparison with the 2018 study, the embolic changes in the right main pulmonary artery may well be chronic given the somewhat irregular contour of the embolus. There are multiple right lower lobe pulmonary emboli with a greater degree of burden of pulmonary embolus in the right lower lobe pulmonary artery vessels compared to the previous study from 2018. Question a degree of acute as well as chronic pulmonary embolus on the right. Previous pulmonary emboli on the left have resolved since the 2018 study. There is no appreciable right heart strain. No thoracic aortic aneurysm or dissection. Visualized great vessels appear unremarkable. Note that the right innominate and left common carotid arteries arise as a common trunk, an anatomic variant. No pericardial effusion or pericardial thickening is evident. The main pulmonary outflow tract measures 3.6 cm in diameter, enlarged. Mediastinum/Nodes: Visualized thyroid is unremarkable. There are scattered subcentimeter mediastinal lymph nodes. There is no adenopathy by size criteria. There is a small hiatal hernia. Lungs/Pleura: There are scattered areas of atelectatic change. No edema or airspace opacity. No appreciable pleural effusions. Upper Abdomen: There is incomplete visualization of a filter in the inferior vena cava. There is hepatic steatosis. Visualized upper abdominal structures otherwise appear unremarkable. Musculoskeletal: There is degenerative change in the lower thoracic spine. No blastic or lytic bone lesions are evident. No chest wall lesions appreciable. Review of the MIP images confirms the above findings. IMPRESSION: 1. In comparison with the prior study from 2018, there remains pulmonary embolus in the right main pulmonary artery which appears similar in distribution and appears slightly irregular in contour. 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 2. There has been an increase in  pulmonary embolus burden in the lower lobe pulmonary arteries on the right compared to the previous study. While these changes could represent chronic pulmonary embolus which occurred in the interval between the 2018 study and current study, acute pulmonary embolus superimposed on chronic pulmonary embolus must be of concern in the right lower lobe pulmonary artery branches given the change from the most recent available study. 3. Interval clearing of pulmonary embolus from the left lower lobe compared to previous study. 4. Enlargement of the main pulmonary outflow tract consistent with pulmonary are per tension. 5.  Scattered areas of atelectasis.  No edema or airspace opacity. 6.  No evident adenopathy. 7.  Small hiatal hernia. 8.  Incomplete visualization of a filter in the inferior vena cava. 9.  Hepatic steatosis. Critical Value/emergent results were called by telephone at the time of interpretation on 01/24/2020 at 10:44 am to provider [facility] , who verbally acknowledged these results. Electronically  Signed   By: Bretta Bang III M.D.   On: 01/24/2020 10:44   US Venous Img Lower Bilateral  Result Date: 01/24/2020 CLINICAL DATA:  Worsening bilateral lower extremity pain and edema. History of previous DVT. Evaluate for acute or chronic DVT. EXAM: BILATERAL LOWER EXTREMITY VENOUS DOPPLER ULTRASOUND TECHNIQUE: Gray-scale sonography with graded compression, as well as color Doppler and duplex ultrasound were performed to evaluate the lower extremity deep venous systems from the level of the common femoral vein and including the common femoral, femoral, profunda femoral, popliteal and calf veins including the posterior tibial, peroneal and gastrocnemius veins when visible. The superficial great saphenous vein was also interrogated. Spectral Doppler was utilized to evaluate flow at rest and with distal augmentation maneuvers in the common femoral, femoral and popliteal veins. COMPARISON:  Bilateral lower  extremity venous Doppler ultrasound-08/04/2017 (chronic thrombus noted within the bilateral femoral and popliteal veins) FINDINGS: RIGHT LOWER EXTREMITY There is hypoechoic occlusive thrombus within the right common femoral vein (image 36, extending to involve the saphenofemoral junction (image 38), not seen on the 12/20 18 examination. The right deep femoral vein appears patent where imaged. There is hypoechoic occlusive thrombus involving the proximal (image 44), mid (image 47) and distal (image 50) aspects of the right femoral vein, progressed compared to the 2018 examination. There is hypoechoic occlusive thrombus involving the proximal (image 54 and distal (image 56) aspects of the right popliteal vein. The tibial veins are poorly visualized. Other Findings:  None. _________________________________________________________ LEFT LOWER EXTREMITY Common Femoral Vein: No evidence of thrombus. Normal compressibility, respiratory phasicity and response to augmentation. Saphenofemoral Junction: No evidence of thrombus. Normal compressibility and flow on color Doppler imaging. Profunda Femoral Vein: No evidence of thrombus. Normal compressibility and flow on color Doppler imaging. Femoral Vein: While there is no definitive evidence of acute or chronic DVT involving the proximal and mid aspects of the left femoral vein, there is hypoechoic occlusive thrombus involving the distal aspects of both paired femoral veins (images 20 through 22). Popliteal Vein: Hypoechoic occlusive thrombus seen involving the proximal (image 26) and distal (image 29) aspects of the left femoral vein, similar to the 2018 examination. Calf Veins: No evidence of thrombus. Normal compressibility and flow on color Doppler imaging. Superficial Great Saphenous Vein: No evidence of thrombus. Normal compressibility. Other Findings:  None. IMPRESSION: 1. Acute on chronic, predominantly occlusive DVT extending from the right common femoral vein through  the right popliteal vein, progressed compared to the 2018 examination. 2. Occlusive DVT involving the distal aspects of both paired left femoral veins extending to involve the left popliteal vein - while the clot burden within the left lower extremity is improved compared to the 2018 examination, in the absence of more recent prior examinations, an acute on chronic process is not excluded. Electronically Signed   By: Simonne Come M.D.   On: 01/24/2020 10:23      Labs: BNP (last 3 results) Recent Labs    01/24/20 0829  BNP 14.1   Basic Metabolic Panel: Recent Labs  Lab 01/24/20 0829 01/26/20 0753 01/27/20 0617  NA 137 137 136  K 4.1 4.3 4.0  CL 101 103 101  CO2 25 24 25   GLUCOSE 123* 114* 107*  BUN 20 23* 26*  CREATININE 1.70* 1.61* 1.39*  CALCIUM 8.9 9.0 9.0  MG  --  1.9 2.0   Liver Function Tests: Recent Labs  Lab 01/24/20 0829  AST 16  ALT 21  ALKPHOS 64  BILITOT 1.4*  PROT  8.6*  ALBUMIN 4.4   No results for input(s): LIPASE, AMYLASE in the last 168 hours. No results for input(s): AMMONIA in the last 168 hours. CBC: Recent Labs  Lab 01/24/20 0829 01/25/20 0200 01/26/20 0753 01/27/20 0617  WBC 6.9 6.0 6.4 5.7  HGB 15.4 14.0 14.9 13.8  HCT 47.8 43.1 45.1 42.5  MCV 93.7 93.5 92.0 91.2  PLT 169 164 205 218   Cardiac Enzymes: No results for input(s): CKTOTAL, CKMB, CKMBINDEX, TROPONINI in the last 168 hours. BNP: Invalid input(s): POCBNP CBG: No results for input(s): GLUCAP in the last 168 hours. D-Dimer No results for input(s): DDIMER in the last 72 hours. Hgb A1c No results for input(s): HGBA1C in the last 72 hours. Lipid Profile No results for input(s): CHOL, HDL, LDLCALC, TRIG, CHOLHDL, LDLDIRECT in the last 72 hours. Thyroid function studies No results for input(s): TSH, T4TOTAL, T3FREE, THYROIDAB in the last 72 hours.  Invalid input(s): FREET3 Anemia work up No results for input(s): VITAMINB12, FOLATE, FERRITIN, TIBC, IRON, RETICCTPCT in the  last 72 hours. Urinalysis    Component Value Date/Time   COLORURINE Yellow 12/25/2014 1655   APPEARANCEUR Hazy 12/25/2014 1655   LABSPEC 1.012 12/25/2014 1655   PHURINE 5.0 12/25/2014 1655   GLUCOSEU Negative 12/25/2014 1655   HGBUR 3+ 12/25/2014 1655   BILIRUBINUR Negative 12/25/2014 1655   KETONESUR Negative 12/25/2014 1655   PROTEINUR 30 mg/dL 65/99/3570 1779   NITRITE Negative 12/25/2014 1655   LEUKOCYTESUR Negative 12/25/2014 1655   Sepsis Labs Invalid input(s): PROCALCITONIN,  WBC,  LACTICIDVEN Microbiology Recent Results (from the past 240 hour(s))  SARS Coronavirus 2 by RT PCR (hospital order, performed in [facility] hospital lab) Nasopharyngeal Nasopharyngeal Swab     Status: None   Collection Time: 01/24/20 10:53 AM   Specimen: Nasopharyngeal Swab  Result Value Ref Range Status   SARS Coronavirus 2 NEGATIVE NEGATIVE Final    Comment: (NOTE) SARS-CoV-2 target nucleic acids are NOT DETECTED. The SARS-CoV-2 RNA is generally detectable in upper and lower respiratory specimens during the acute phase of infection. The lowest concentration of SARS-CoV-2 viral copies this assay can detect is 250 copies / mL. A negative result does not preclude SARS-CoV-2 infection and should not be used as the sole basis for treatment or other patient management decisions.  A negative result may occur with improper specimen collection / handling, submission of specimen other than nasopharyngeal swab, presence of viral mutation(s) within the areas targeted by this assay, and inadequate number of viral copies (<250 copies / mL). A negative result must be combined with clinical observations, patient history, and epidemiological information. Fact Sheet for Patients:   BoilerBrush[employer]com.cy Fact Sheet for Healthcare Providers: https://[name].com/ This test is not yet approved or cleared  by the [place] FDA and has been authorized for detection  and/or diagnosis of SARS-CoV-2 by FDA under an Emergency Use Authorization (EUA).  This EUA will remain in effect (meaning this test can be used) for the duration of the COVID-19 declaration under Section 564(b)(1) of the Act, 21 U.S.C. section 360bbb-3(b)(1), unless the authorization is terminated or revoked sooner. Performed at [facility], [address] [place], [place] [address]      Total time spend on discharging this patient, including the last patient exam, discussing the hospital stay, instructions for ongoing care as it relates to all pertinent caregivers, as well as preparing the medical discharge records, prescriptions, and/or referrals as applicable, is 40 minutes.    Darlin Priestly, MD  [facility] 01/27/2020, 10:21 AM  If 7PM-7AM, please contact night-coverage

## 2020-01-27 NOTE — TOC Transition Note (Signed)
Transition of Care [facility]) - CM/SW Discharge Note   Patient Details  Name: Ryan [name] MRN: [id number] Date of Birth: Mar 21, 1971  [facility] CM/SW Contact:  Meriel Flavors, LCSW Phone Number: 01/27/2020, 10:09 AM   Clinical Narrative:    Patient discharging today, CSW met with patient and delivered requested Medication discount cards. No other TOC needs identified at this time.         Patient Goals and CMS Choice        Discharge Placement                       Discharge Plan and Services                                     Social Determinants of Health (SDOH) Interventions     Readmission Risk Interventions No flowsheet data found.

## 2020-03-07 ENCOUNTER — Emergency Department
Admission: EM | Admit: 2020-03-07 | Discharge: 2020-03-08 | Disposition: A | Discharge status: Home / Self Care | Payer: Commercial Managed Care - PPO | Attending: Emergency Medicine | Admitting: Emergency Medicine

## 2020-03-07 ENCOUNTER — Other Ambulatory Visit: Payer: Self-pay

## 2020-03-07 ENCOUNTER — Encounter: Payer: Self-pay | Admitting: *Deleted

## 2020-03-07 DIAGNOSIS — E119 Type 2 diabetes mellitus without complications: Secondary | ICD-10-CM | POA: Insufficient documentation

## 2020-03-07 DIAGNOSIS — Z79899 Other long term (current) drug therapy: Secondary | ICD-10-CM | POA: Insufficient documentation

## 2020-03-07 DIAGNOSIS — I83891 Varicose veins of right lower extremities with other complications: Secondary | ICD-10-CM | POA: Diagnosis not present

## 2020-03-07 DIAGNOSIS — I70213 Atherosclerosis of native arteries of extremities with intermittent claudication, bilateral legs: Secondary | ICD-10-CM | POA: Diagnosis present

## 2020-03-07 DIAGNOSIS — I130 Hypertensive heart and chronic kidney disease with heart failure and stage 1 through stage 4 chronic kidney disease, or unspecified chronic kidney disease: Secondary | ICD-10-CM | POA: Diagnosis not present

## 2020-03-07 DIAGNOSIS — N183 Chronic kidney disease, stage 3 unspecified: Secondary | ICD-10-CM | POA: Insufficient documentation

## 2020-03-07 DIAGNOSIS — I5032 Chronic diastolic (congestive) heart failure: Secondary | ICD-10-CM | POA: Diagnosis not present

## 2020-03-07 LAB — CBC
HCT: 40.6 % (ref 39.0–52.0)
Hemoglobin: 13.3 g/dL (ref 13.0–17.0)
MCH: 30.5 pg (ref 26.0–34.0)
MCHC: 32.8 g/dL (ref 30.0–36.0)
MCV: 93.1 fL (ref 80.0–100.0)
Platelets: 280 10*3/uL (ref 150–400)
RBC: 4.36 MIL/uL (ref 4.22–5.81)
RDW: 14.9 % (ref 11.5–15.5)
WBC: 4.6 10*3/uL (ref 4.0–10.5)
nRBC: 0 % (ref 0.0–0.2)

## 2020-03-07 LAB — BASIC METABOLIC PANEL
Anion gap: 8 (ref 5–15)
BUN: 14 mg/dL (ref 6–20)
CO2: 22 mmol/L (ref 22–32)
Calcium: 9 mg/dL (ref 8.9–10.3)
Chloride: 106 mmol/L (ref 98–111)
Creatinine, Ser: 1.66 mg/dL — ABNORMAL HIGH (ref 0.61–1.24)
GFR calc Af Amer: 55 mL/min — ABNORMAL LOW (ref >60)
GFR calc non Af Amer: 47 mL/min — ABNORMAL LOW (ref >60)
Glucose, Bld: 108 mg/dL — ABNORMAL HIGH (ref 70–99)
Potassium: 4.1 mmol/L (ref 3.5–5.1)
Sodium: 136 mmol/L (ref 135–145)

## 2020-03-07 NOTE — ED Notes (Signed)
Patient to ED via EMS for complaint of bleeding on the back side of the right leg. EMS describes a ruptured varicose vein. Is on blood thinners. Bleeding controlled by Fire at the scene.

## 2020-03-07 NOTE — ED Triage Notes (Signed)
Pt presents via EMS w/ bleeding from varicose vein on L calf. Pt states recent PMH of DVT and PE, most recent Memorial day of 2021. Pt is presently being anticoagulated w/ Lesia Hausen. Pt has used coumadin in the past. Pt states he was seen for a ruptured varicose vein in R calf this past Wed. Pt states after showering today, bleeding from ruptured vein started again and was persistent and a large amount per his report. Pt has bandage placed by EMS and bleeding is controlled at this time. Pt is A&O x 4 and w/o pain.

## 2020-03-08 NOTE — ED Provider Notes (Signed)
[facility] Emergency Department Provider Note  ____________________________________________  Time seen: Approximately 12:56 AM  I have reviewed the triage vital signs and the nursing notes.   HISTORY  Chief Complaint Claudication   HPI Ryan [name] is a 49 y.o. male with a history of PE and DVT on Xarelto, peripheral vascular disease, diabetes, CHF who presents for evaluation of a bleeding varicose vein.  Patient reports that a couple days ago he noticed some bleeding from a varicose vein on the right lower extremity.  That stopped with pressure.  He had a pressure dressing placed into earlier today.  He took it off and took a warm shower.  When he got out of the shower he started bleeding again.   Patient reports that he was unable to stop the bleeding on his own with pressure and reports large amount of blood was lost.  Denies any dizziness, chest pain or shortness of breath.  EMS placed a bandage and during my evaluation the bleeding is now controlled.  Past Medical History:  Diagnosis Date  . CHF (congestive heart failure) (HCC)   . Diabetes mellitus without complication (HCC)    per pt-pre diabetic-Dr Dario Guardian stated said pt  . DVT (deep venous thrombosis) (HCC)   . Hypertension   . Peripheral vascular disease (HCC)   . Presence of IVC filter     Patient Active Problem List   Diagnosis Date Noted  . Diabetes (HCC) 08/03/2017  . CAP (community acquired pneumonia) 08/03/2017  . CKD (chronic kidney disease), stage III 08/03/2017  . Pulmonary embolus (HCC) 01/03/2017  . Chronic diastolic heart failure (HCC) 12/15/2016  . HTN (hypertension) 12/15/2016  . Fatigue 12/15/2016  . PVC (premature ventricular contraction) 11/24/2016  . 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  . Obesity, Class III, BMI 40-49.9 (morbid obesity) (HCC) 11/24/2016  . Pulmonary emboli (HCC) 11/22/2016  . DVT (deep venous thrombosis) (HCC) 12/28/2014    Past Surgical History:    Procedure Laterality Date  . PERIPHERAL VASCULAR CATHETERIZATION N/A 01/10/2015   Procedure: Dialysis/Perma Catheter Insertion;  Surgeon: Renford Dills, MD;  Location: [facility] INVASIVE CV LAB;  Service: Cardiovascular;  Laterality: N/A;  . PERIPHERAL VASCULAR CATHETERIZATION N/A 02/24/2015   Procedure: Dialysis/Perma Catheter Removal;  Surgeon: Annice Needy, MD;  Location: [facility] INVASIVE CV LAB;  Service: Cardiovascular;  Laterality: N/A;    Prior to Admission medications   Medication Sig Start Date End Date Taking? Authorizing Provider  atorvastatin (LIPITOR) 10 MG tablet atorvastatin 10 mg tablet  TK 1 T PO QD    [provider]  b complex vitamins capsule Take by mouth.    [provider]  benzonatate (TESSALON) 100 MG capsule benzonatate 100 mg capsule  Take 3 times a day as needed. 01/27/20   Darlin Priestly, MD  calcium acetate (PHOSLO) 667 MG capsule calcium acetate(phosphate binders) 667 mg capsule    [provider]  Docusate Sodium (DSS) 100 MG CAPS Doc-Q-Lace 100 mg capsule  TK 1 C PO BID PRN    [provider]  hydrochlorothiazide (HYDRODIURIL) 25 MG tablet Take 1 tablet (25 mg total) by mouth daily. 01/27/20 02/26/20  Darlin Priestly, MD  metoprolol tartrate (LOPRESSOR) 25 MG tablet Take 1 tablet (25 mg total) by mouth 2 (two) times daily. 01/27/20 02/26/20  Darlin Priestly, MD  ondansetron (ZOFRAN) 4 MG tablet ondansetron HCl 4 mg tablet.  Take it 3 times a day as needed. 01/27/20   Darlin Priestly, MD  pantoprazole (PROTONIX) 40 MG  tablet pantoprazole 40 mg tablet,delayed release    [provider]  polyethylene glycol powder (GLYCOLAX/MIRALAX) 17 GM/SCOOP powder polyethylene glycol 3350 17 gram/dose oral powder    [provider]  rivaroxaban (XARELTO) 20 MG TABS tablet Take 1 tablet (20 mg total) by mouth daily with supper. New blood thinner. 02/16/20 04/16/20  Darlin Priestly, MD  RIVAROXABAN Carlena Hurl) VTE STARTER PACK (15 & 20 MG TABLETS) Follow package  directions: Take one 15mg  tablet by mouth twice a day. On day 22, switch to one 20mg  tablet once a day. Take with food. 01/27/20   , MD    Allergies Clindamycin/lincomycin and Sulfa antibiotics  Family History  Problem Relation Age of Onset  . Hypertension Mother   . Cancer Mother   . Breast cancer Mother   . Prostate cancer Father   . Hypertension Father   . Diabetes Father     Social History Social History   Tobacco Use  . Smoking status: Never Smoker  . Smokeless tobacco: Never Used  Vaping Use  . Vaping Use: Never used  Substance Use Topics  . Alcohol use: No  . Drug use: No    Review of Systems  Constitutional: Negative for fever. Eyes: Negative for visual changes. ENT: Negative for sore throat. Neck: No neck pain  Cardiovascular: Negative for chest pain. + bleeding varicose vein Respiratory: Negative for shortness of breath. Gastrointestinal: Negative for abdominal pain, vomiting or diarrhea. Genitourinary: Negative for dysuria. Musculoskeletal: Negative for back pain.  Skin: Negative for rash. Neurological: Negative for headaches, weakness or numbness. Psych: No SI or HI  ____________________________________________   PHYSICAL EXAM:  VITAL SIGNS: ED Triage Vitals  Enc Vitals Group     BP 03/07/20 1956 (!) 152/94     Pulse Rate 03/07/20 1956 96     Resp 03/07/20 1956 (!) 27     Temp 03/07/20 1956 99.1 F (37.3 C)     Temp Source 03/07/20 1956 Oral     SpO2 03/07/20 1956 96 %     Weight 03/07/20 2004 (!) 350 lb (158.8 kg)     Height 03/07/20 2004 5\' 7"  (1.702 m)     Head Circumference --      Peak Flow --      Pain Score 03/07/20 2004 0     Pain Loc --      Pain Edu? --      Excl. in GC? --     Constitutional: Alert and oriented. Well appearing and in no apparent distress. HEENT:      Head: Normocephalic and atraumatic.         Eyes: Conjunctivae are normal. Sclera is non-icteric.       Mouth/Throat: Mucous membranes are moist.        Neck: Supple with no signs of meningismus. Cardiovascular: Regular rate and rhythm. No murmurs, gallops, or rubs. Respiratory: Normal respiratory effort. Lungs are clear to auscultation bilaterally.  Gastrointestinal: Soft, non tender. Musculoskeletal: There is sequela of recent bleeding on the posterior R calf, no active bleeding at this time.   Neurologic: Normal speech and language. Face is symmetric. Moving all extremities. No gross focal neurologic deficits are appreciated. Skin: Skin is warm, dry and intact. No rash noted. Psychiatric: Mood and affect are normal. Speech and behavior are normal.  ____________________________________________   LABS (all labs ordered are listed, but only abnormal results are displayed)  Labs Reviewed  BASIC METABOLIC PANEL - Abnormal; Notable for the following components:  Result Value   Glucose, Bld 108 (*)    Creatinine, Ser 1.66 (*)    GFR calc non Af Amer 47 (*)    GFR calc Af Amer 55 (*)    All other components within normal limits  CBC   ____________________________________________  EKG  none  ____________________________________________  RADIOLOGY  none  ____________________________________________   PROCEDURES  Procedure(s) performed: None Procedures Critical Care performed:  None ____________________________________________   INITIAL IMPRESSION / ASSESSMENT AND PLAN / ED COURSE   49 y.o. male with a history of PE and DVT on Xarelto, peripheral vascular disease, diabetes, CHF who presents for evaluation of a bleeding varicose vein.  Patient arrives with pressure dressing applied per EMS.  No signs of active bleeding.  Wound was redressed with quick clot gauze and pressure dressing.  Patient was monitored for 1.5 hours with no recurrence of the bleeding.  Patient is already a patient of Dr. Drucilla Schmidt from vascular surgery.  Recommended follow-up with him on Monday.  Recommended leaving the pressure dressing in place until  then.  Discussed continuation of Xarelto as prescribed.  Lab work was done showing no signs of acute blood loss anemia.  Return precautions discussed with patient.  Old medical records reviewed.      _____________________________________________ Please note:  Patient was evaluated in Emergency Department today for the symptoms described in the history of present illness. Patient was evaluated in the context of the global COVID-19 pandemic, which necessitated consideration that the patient might be at risk for infection with the SARS-CoV-2 virus that causes COVID-19. Institutional protocols and algorithms that pertain to the evaluation of patients at risk for COVID-19 are in a state of rapid change based on information released by regulatory bodies including the CDC and federal and state organizations. These policies and algorithms were followed during the patient's care in the ED.  Some ED evaluations and interventions may be delayed as a result of limited staffing during the pandemic.    Controlled Substance Database was reviewed by me. ____________________________________________   FINAL CLINICAL IMPRESSION(S) / ED DIAGNOSES   Final diagnoses:  Bleeding from varicose veins of right lower extremity      NEW MEDICATIONS STARTED DURING THIS VISIT:  ED Discharge Orders    None       Note:  This document was prepared using Dragon voice recognition software and may include unintentional dictation errors.    Don Perking, [place], MD 03/08/20 [phone]

## 2020-03-08 NOTE — Discharge Instructions (Signed)
Keep dressing in place until evaluated by Dr. Wyn Quaker to prevent it from re-bleeding. Return to the ER if it starts to bleed again. Continue to take your blood thinners as prescribed.

## 2020-03-11 ENCOUNTER — Ambulatory Visit (INDEPENDENT_AMBULATORY_CARE_PROVIDER_SITE_OTHER): Payer: Commercial Managed Care - PPO | Admitting: Nurse Practitioner

## 2020-03-11 ENCOUNTER — Other Ambulatory Visit: Payer: Self-pay

## 2020-03-11 ENCOUNTER — Encounter (INDEPENDENT_AMBULATORY_CARE_PROVIDER_SITE_OTHER): Payer: Self-pay | Admitting: Nurse Practitioner

## 2020-03-11 VITALS — BP 168/111 | HR 111 | Resp 16 | Wt 363.8 lb

## 2020-03-11 DIAGNOSIS — I825Z3 Chronic embolism and thrombosis of unspecified deep veins of distal lower extremity, bilateral: Secondary | ICD-10-CM

## 2020-03-11 DIAGNOSIS — I10 Essential (primary) hypertension: Secondary | ICD-10-CM | POA: Diagnosis not present

## 2020-03-11 DIAGNOSIS — M25569 Pain in unspecified knee: Secondary | ICD-10-CM | POA: Insufficient documentation

## 2020-03-11 DIAGNOSIS — I83893 Varicose veins of bilateral lower extremities with other complications: Secondary | ICD-10-CM | POA: Diagnosis not present

## 2020-03-12 ENCOUNTER — Encounter (INDEPENDENT_AMBULATORY_CARE_PROVIDER_SITE_OTHER): Payer: Self-pay | Admitting: Nurse Practitioner

## 2020-03-12 NOTE — Progress Notes (Signed)
Subjective:    Patient ID: Ryan [name], male    DOB: 04/06/1971, 49 y.o.   MRN: [id number] Chief Complaint  Patient presents with  . Follow-up    seen in ER for vv rle bleeding    Ryan [name] is a 49 year old male that presents to the office after a recent episode of bleeding varicosities.  The patient notes that he had an initial episode that he was able to stop at home.  The second episode the patient was unable to gain control of the bleeding and subsequently had to go to the emergency room for treatment of the wound.  The patient does have a previous medical history of numerous DVTs and pulmonary embolisms.  The patient does have an IVC filter in place.  He is currently anticoagulated with Xarelto.  In reviewing previous studies it is unclear if the patient has a hypercoagulable diagnosis however it is notable that there are multiple first-degree relatives that have had DVTs.  The patient's most recent DVT was 01/24/2020.  The patient was also previously seen in our office in 2016.  Besides the bleeding varicosities he denies any fever, chills or extensive shortness of breath.   Review of Systems  Skin: Positive for wound.       Bleeding varicosities  All other systems reviewed and are negative.      Objective:   Physical Exam Vitals reviewed.  Constitutional:      General: He is not in acute distress.    Appearance: He is obese.  HENT:     Head: Normocephalic.  Cardiovascular:     Rate and Rhythm: Normal rate.     Pulses: Normal pulses.  Pulmonary:     Effort: Pulmonary effort is normal.  Musculoskeletal:     Right lower leg: Edema present.     Left lower leg: Edema present.  Skin:    General: Skin is warm and dry.     Capillary Refill: Capillary refill takes less than 2 seconds.  Neurological:     Mental Status: He is alert and oriented to person, place, and time.  Psychiatric:        Mood and Affect: Mood normal.        Behavior: Behavior normal.        Thought  Content: Thought content normal.        Judgment: Judgment normal.     BP (!) 168/111 (BP Location: Right Arm)   Pulse (!) 111   Resp 16   Wt (!) 363 lb 12.8 oz (165 kg)   BMI 56.98 kg/m   Past Medical History:  Diagnosis Date  . CHF (congestive heart failure) (HCC)   . Diabetes mellitus without complication (HCC)    per pt-pre diabetic-Dr Dario Guardian stated said pt  . DVT (deep venous thrombosis) (HCC)   . Hypertension   . Peripheral vascular disease (HCC)   . Presence of IVC filter     Social History   Socioeconomic History  . Marital status: Married    Spouse name: Not on file  . Number of children: Not on file  . Years of education: Not on file  . Highest education level: Not on file  Occupational History  . Not on file  Tobacco Use  . Smoking status: Never Smoker  . Smokeless tobacco: Never Used  Vaping Use  . Vaping Use: Never used  Substance and Sexual Activity  . Alcohol use: No  . Drug use: No  . Sexual activity:  Yes  Other Topics Concern  . Not on file  Social History Narrative  . Not on file   Social Determinants of Health   Financial Resource Strain:   . Difficulty of Paying Living Expenses:   Food Insecurity:   . Worried About Programme researcher, broadcasting/film/video in the Last Year:   . Barista in the Last Year:   Transportation Needs:   . Freight forwarder (Medical):   [name] Lack of Transportation (Non-Medical):   Physical Activity:   . Days of Exercise per Week:   . Minutes of Exercise per Session:   Stress:   . Feeling of Stress :   Social Connections:   . Frequency of Communication with Friends and Family:   . Frequency of Social Gatherings with Friends and Family:   . Attends Religious Services:   . Active Member of Clubs or Organizations:   . Attends Banker Meetings:   [name] Marital Status:   Intimate Partner Violence:   . Fear of Current or Ex-Partner:   . Emotionally Abused:   [name] Physically Abused:   . Sexually Abused:      Past Surgical History:  Procedure Laterality Date  . PERIPHERAL VASCULAR CATHETERIZATION N/A 01/10/2015   Procedure: Dialysis/Perma Catheter Insertion;  Surgeon: Renford Dills, MD;  Location: [facility] INVASIVE CV LAB;  Service: Cardiovascular;  Laterality: N/A;  . PERIPHERAL VASCULAR CATHETERIZATION N/A 02/24/2015   Procedure: Dialysis/Perma Catheter Removal;  Surgeon: Annice Needy, MD;  Location: [facility] INVASIVE CV LAB;  Service: Cardiovascular;  Laterality: N/A;    Family History  Problem Relation Age of Onset  . Hypertension Mother   . Cancer Mother   . Breast cancer Mother   . Prostate cancer Father   . Hypertension Father   . Diabetes Father     Allergies  Allergen Reactions  . Clindamycin/Lincomycin Rash  . Sulfa Antibiotics Rash       Assessment & Plan:   1. Chronic deep vein thrombosis (DVT) of distal vein of both lower extremities (HCC) The patient was recently diagnosed with an acute DVT at [facility].  We will wait until this is possibly resolved before we do any noninvasive studies.  2. Varicose veins of bilateral lower extremities with other complications Today we did a emergent sclerotherapy to try to help prevent bleeding recurrence.  Patient tolerated the procedure well.  Compression bandage was applied following procedure.  We will have patient return to the office in about 8 weeks for bilateral lower extremity venous reflux.  Due to the patient's history of multiple DVTs, functional study of the veins is necessary to determine what the best course of treatment for the patient's bleeding varicosities are.  Patient is also advised to continue to wear medical grade 1 compression stockings on a daily basis once pressure dressing is removed.  3. Essential hypertension Continue antihypertensive medications as already ordered, these medications have been reviewed and there are no changes at this time.    Current Outpatient Medications on File  Prior to Visit  Medication Sig Dispense Refill  . hydrochlorothiazide (HYDRODIURIL) 25 MG tablet Take 1 tablet (25 mg total) by mouth daily. 30 tablet 0  . lisinopril (ZESTRIL) 10 MG tablet Take 10 mg by mouth daily.    . rivaroxaban (XARELTO) 20 MG TABS tablet Take 1 tablet (20 mg total) by mouth daily with supper. New blood thinner. 30 tablet 1  . RIVAROXABAN (XARELTO) VTE STARTER PACK (15 & 20  MG TABLETS) Follow package directions: Take one 15mg  tablet by mouth twice a day. On day 22, switch to one 20mg  tablet once a day. Take with food. (Patient not taking: Reported on 03/11/2020) 51 each 0   No current facility-administered medications on file prior to visit.    There are no Patient Instructions on file for this visit. No follow-ups on file.   , NP

## 2020-05-13 ENCOUNTER — Ambulatory Visit (INDEPENDENT_AMBULATORY_CARE_PROVIDER_SITE_OTHER): Payer: Self-pay | Admitting: Vascular Surgery

## 2020-05-13 ENCOUNTER — Encounter (INDEPENDENT_AMBULATORY_CARE_PROVIDER_SITE_OTHER): Payer: Self-pay

## 2020-05-28 ENCOUNTER — Other Ambulatory Visit (INDEPENDENT_AMBULATORY_CARE_PROVIDER_SITE_OTHER): Payer: Self-pay | Admitting: Nurse Practitioner

## 2020-05-28 DIAGNOSIS — I83893 Varicose veins of bilateral lower extremities with other complications: Secondary | ICD-10-CM

## 2020-05-28 DIAGNOSIS — I82509 Chronic embolism and thrombosis of unspecified deep veins of unspecified lower extremity: Secondary | ICD-10-CM

## 2020-06-03 ENCOUNTER — Encounter (INDEPENDENT_AMBULATORY_CARE_PROVIDER_SITE_OTHER): Payer: Self-pay

## 2020-06-03 ENCOUNTER — Ambulatory Visit (INDEPENDENT_AMBULATORY_CARE_PROVIDER_SITE_OTHER): Payer: Self-pay | Admitting: Vascular Surgery

## 2020-06-12 ENCOUNTER — Encounter (INDEPENDENT_AMBULATORY_CARE_PROVIDER_SITE_OTHER): Payer: Self-pay | Admitting: Nurse Practitioner

## 2020-06-12 ENCOUNTER — Ambulatory Visit (INDEPENDENT_AMBULATORY_CARE_PROVIDER_SITE_OTHER): Payer: Commercial Managed Care - PPO

## 2020-06-12 ENCOUNTER — Other Ambulatory Visit: Payer: Self-pay

## 2020-06-12 ENCOUNTER — Ambulatory Visit (INDEPENDENT_AMBULATORY_CARE_PROVIDER_SITE_OTHER): Payer: Commercial Managed Care - PPO | Admitting: Nurse Practitioner

## 2020-06-12 VITALS — BP 175/110 | HR 83 | Ht 67.0 in | Wt 359.0 lb

## 2020-06-12 DIAGNOSIS — I825Z3 Chronic embolism and thrombosis of unspecified deep veins of distal lower extremity, bilateral: Secondary | ICD-10-CM | POA: Diagnosis not present

## 2020-06-12 DIAGNOSIS — I10 Essential (primary) hypertension: Secondary | ICD-10-CM | POA: Diagnosis not present

## 2020-06-12 DIAGNOSIS — I82509 Chronic embolism and thrombosis of unspecified deep veins of unspecified lower extremity: Secondary | ICD-10-CM | POA: Diagnosis not present

## 2020-06-12 DIAGNOSIS — I83893 Varicose veins of bilateral lower extremities with other complications: Secondary | ICD-10-CM

## 2020-06-12 NOTE — Progress Notes (Signed)
Subjective:    Patient ID: Ryan [name], male    DOB: 06/12/1971, 49 y.o.   MRN: [id number] Chief Complaint  Patient presents with  . Follow-up    U/S follow up    Ryan [name] is a 49 year old male that presents to the office after a recent episode of bleeding varicosities in the right lower extremity.  The patient notes that he had an initial episode that he was able to stop at home.  The second episode the patient was unable to gain control of the bleeding and subsequently had to go to the emergency room for treatment of the wound.  The patient does have a previous medical history of numerous DVTs and pulmonary embolisms.  The patient does have an IVC filter in place.  He is currently anticoagulated with Xarelto.  In reviewing previous studies it is unclear if the patient has a hypercoagulable diagnosis however it is notable that there are multiple first-degree relatives that have had DVTs.  The patient's most recent DVT was 01/24/2020.  The patient was also previously seen in our office in 2016.  Besides the bleeding varicosities he denies any fever, chills or extensive shortness of breath.  At the patient's last office visit emergent sclerotherapy was done to ensure no recurrence of bleeding.  There has been no recurrence since his previous visit.  He tolerated the sclerotherapy well.  He continues to take his Xarelto as prescribed.  Today noninvasive studies show no evidence of DVT bilaterally.  There is no evidence of deep venous insufficiency seen bilaterally there is no evidence of superficial venous reflux in the short saphenous veins bilaterally.  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  The right proximal thigh area has a hematoma measuring 3.63 cm x 2.40 cm.  There is reflux noted in the right saphenofemoral junction.   Review of Systems  Cardiovascular: Positive for leg swelling.  Hematological: Bruises/bleeds easily.  All other systems  reviewed and are negative.      Objective:   Physical Exam Vitals reviewed.  Constitutional:      Appearance: He is obese.  HENT:     Head: Normocephalic.  Cardiovascular:     Rate and Rhythm: Normal rate.     Pulses: Normal pulses.  Pulmonary:     Effort: Pulmonary effort is normal.  Skin:    Findings: Bruising present.  Neurological:     Mental Status: He is alert and oriented to person, place, and time.  Psychiatric:        Mood and Affect: Mood normal.        Behavior: Behavior normal.        Thought Content: Thought content normal.        Judgment: Judgment normal.     BP (!) 175/110   Pulse 83   Ht 5\' 7"  (1.702 m)   Wt (!) 359 lb (162.8 kg)   BMI 56.23 kg/m   Past Medical History:  Diagnosis Date  . CHF (congestive heart failure) (HCC)   . Diabetes mellitus without complication (HCC)    per pt-pre diabetic-Dr stated said pt  . DVT (deep venous thrombosis) (HCC)   . Hypertension   . Peripheral vascular disease (HCC)   . Presence of IVC filter     Social History   Socioeconomic History  . Marital status: Married    Spouse name: Not on file  . Number of children: Not on file  . Years of education: Not  on file  . Highest education level: Not on file  Occupational History  . Not on file  Tobacco Use  . Smoking status: Never Smoker  . Smokeless tobacco: Never Used  Vaping Use  . Vaping Use: Never used  Substance and Sexual Activity  . Alcohol use: No  . Drug use: No  . Sexual activity: Yes  Other Topics Concern  . Not on file  Social History Narrative  . Not on file   Social Determinants of Health   Financial Resource Strain:   . Difficulty of Paying Living Expenses: Not on file  Food Insecurity:   . Worried About Programme researcher, broadcasting/film/video in the Last Year: Not on file  . Ran Out of Food in the Last Year: Not on file  Transportation Needs:   . Lack of Transportation (Medical): Not on file  . Lack of Transportation (Non-Medical): Not on  file  Physical Activity:   . Days of Exercise per Week: Not on file  . Minutes of Exercise per Session: Not on file  Stress:   . Feeling of Stress : Not on file  Social Connections:   . Frequency of Communication with Friends and Family: Not on file  . Frequency of Social Gatherings with Friends and Family: Not on file  . Attends Religious Services: Not on file  . Active Member of Clubs or Organizations: Not on file  . Attends Banker Meetings: Not on file  . Marital Status: Not on file  Intimate Partner Violence:   . Fear of Current or Ex-Partner: Not on file  . Emotionally Abused: Not on file  . Physically Abused: Not on file  . Sexually Abused: Not on file    Past Surgical History:  Procedure Laterality Date  . PERIPHERAL VASCULAR CATHETERIZATION N/A 01/10/2015   Procedure: Dialysis/Perma Catheter Insertion;  Surgeon: Renford Dills, MD;  Location: [facility] INVASIVE CV LAB;  Service: Cardiovascular;  Laterality: N/A;  . PERIPHERAL VASCULAR CATHETERIZATION N/A 02/24/2015   Procedure: Dialysis/Perma Catheter Removal;  Surgeon: Annice Needy, MD;  Location: [facility] INVASIVE CV LAB;  Service: Cardiovascular;  Laterality: N/A;    Family History  Problem Relation Age of Onset  . Hypertension Mother   . Cancer Mother   . Breast cancer Mother   . Prostate cancer Father   . Hypertension Father   . Diabetes Father     Allergies  Allergen Reactions  . Iodinated Diagnostic Agents Other (See Comments)    Kidney failure requiring dialysis Kidney failure requiring dialysis Kidney failure requiring dialysis   . Metrizamide Other (See Comments)    Kidney failure requiring dialysis Kidney failure requiring dialysis   . Clindamycin/Lincomycin Rash  . Lincomycin Rash  . Sulfa Antibiotics Rash       Assessment & Plan:   1. Varicose veins of bilateral lower extremities with other complications Recommend:  Based upon the patient's history of a hypercoagulable state as  well as numerous DVTs, proceeding with endovenous laser sclerotherapy of his great saphenous vein would not be in his best interest.  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  Patient should undergo injection sclerotherapy to treat the residual varicosities of the right lower extremity as well as to prevent further hemorrhage from superficial varicosities.  The risks, benefits and alternative therapies were reviewed in detail with the patient.  All questions were answered.  The patient agrees to proceed  with sclerotherapy at their convenience.  The patient will continue wearing the graduated compression stockings and using the over-the-counter pain medications to treat her symptoms.       2. Primary hypertension Continue antihypertensive medications as already ordered, these medications have been reviewed and there are no changes at this time.   3. 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  Patient will continue with anticoagulation as previously prescribed   Current Outpatient Medications on File Prior to Visit  Medication Sig Dispense Refill  . RIVAROXABAN (XARELTO) VTE STARTER PACK (15 & 20 MG TABLETS) Follow package directions: Take one 15mg  tablet by mouth twice a day. On day 22, switch to one 20mg  tablet once a day. Take with food. 51 each 0  . hydrochlorothiazide (HYDRODIURIL) 25 MG tablet Take 1 tablet (25 mg total) by mouth daily. 30 tablet 0  . lisinopril (ZESTRIL) 10 MG tablet Take 10 mg by mouth daily. (Patient not taking: Reported on 06/12/2020)    . losartan (COZAAR) 50 MG tablet Take 50 mg by mouth daily.    . metoprolol tartrate (LOPRESSOR) 25 MG tablet Take 25 mg by mouth 2 (two) times daily.    . rivaroxaban (XARELTO) 20 MG TABS tablet Take 1 tablet (20  mg total) by mouth daily with supper. New blood thinner. 30 tablet 1   No current facility-administered medications on file prior to visit.    There are no Patient Instructions on file for this visit. No follow-ups on file.   , NP

## 2020-10-01 DIAGNOSIS — I4892 Unspecified atrial flutter: Secondary | ICD-10-CM | POA: Insufficient documentation

## 2020-10-14 DIAGNOSIS — I4891 Unspecified atrial fibrillation: Secondary | ICD-10-CM | POA: Insufficient documentation

## 2020-10-28 HISTORY — PX: CARDIAC ELECTROPHYSIOLOGY STUDY AND ABLATION: SHX1294

## 2021-03-12 ENCOUNTER — Encounter: Payer: Self-pay | Admitting: *Deleted

## 2021-05-21 ENCOUNTER — Telehealth (INDEPENDENT_AMBULATORY_CARE_PROVIDER_SITE_OTHER): Payer: Self-pay

## 2021-05-21 NOTE — Telephone Encounter (Signed)
This may not be related to anything vascular as the symptoms seem to somewhat sound like neuropathy.  I would suggest he see his PCP as well.  We can bring him in for abis and a rle venous reflux study with me or gs

## 2021-05-22 NOTE — Telephone Encounter (Signed)
Patient was made aware with medical recommendations and verbalized that he will contact his PCP. Patient will contact the office if he plans to move forward with scheduling .

## 2021-05-27 ENCOUNTER — Telehealth (INDEPENDENT_AMBULATORY_CARE_PROVIDER_SITE_OTHER): Payer: Self-pay | Admitting: Vascular Surgery

## 2021-05-27 NOTE — Telephone Encounter (Signed)
My recommendation is the same as on 05/21/2021.Marland KitchenMarland KitchenMarland KitchenThis may not be related to anything vascular as the symptoms seem to somewhat sound like neuropathy or possibly musculoskeletal .  I would suggest he see his PCP despite his emergency room visit and in addition to seeing [place].  This is because if his problem is not vascular, we will be sending him to see his Pcp for further workup.  We can bring him in for abis and a rle venous reflux study with me or gs

## 2021-05-27 NOTE — Telephone Encounter (Signed)
Called stating that he is having issues with his right leg. Patient isnt able to put weight on rle/foot and experiencing pain. Patient went to [facility] and had US done but would like to come in to be seen. Patient was last seen 05/2020 with le ven reflux studies (fb). Please advise.

## 2021-05-27 NOTE — Telephone Encounter (Signed)
Called to schedule patient.

## 2021-06-03 ENCOUNTER — Encounter: Payer: Self-pay | Admitting: Emergency Medicine

## 2021-06-03 ENCOUNTER — Observation Stay
Admission: EM | Admit: 2021-06-03 | Discharge: 2021-06-07 | Disposition: A | Discharge status: Home Health | Payer: Commercial Managed Care - PPO | Attending: Internal Medicine | Admitting: Internal Medicine

## 2021-06-03 ENCOUNTER — Other Ambulatory Visit: Payer: Self-pay

## 2021-06-03 ENCOUNTER — Emergency Department: Payer: Commercial Managed Care - PPO

## 2021-06-03 DIAGNOSIS — I5031 Acute diastolic (congestive) heart failure: Secondary | ICD-10-CM | POA: Diagnosis present

## 2021-06-03 DIAGNOSIS — Z86711 Personal history of pulmonary embolism: Secondary | ICD-10-CM | POA: Diagnosis present

## 2021-06-03 DIAGNOSIS — N183 Chronic kidney disease, stage 3 unspecified: Secondary | ICD-10-CM | POA: Diagnosis not present

## 2021-06-03 DIAGNOSIS — Z7901 Long term (current) use of anticoagulants: Secondary | ICD-10-CM | POA: Insufficient documentation

## 2021-06-03 DIAGNOSIS — Z6841 Body Mass Index (BMI) 40.0 and over, adult: Secondary | ICD-10-CM | POA: Diagnosis not present

## 2021-06-03 DIAGNOSIS — I4891 Unspecified atrial fibrillation: Secondary | ICD-10-CM | POA: Diagnosis not present

## 2021-06-03 DIAGNOSIS — L03115 Cellulitis of right lower limb: Principal | ICD-10-CM | POA: Diagnosis present

## 2021-06-03 DIAGNOSIS — N179 Acute kidney failure, unspecified: Secondary | ICD-10-CM | POA: Diagnosis not present

## 2021-06-03 DIAGNOSIS — I825Z3 Chronic embolism and thrombosis of unspecified deep veins of distal lower extremity, bilateral: Secondary | ICD-10-CM | POA: Diagnosis not present

## 2021-06-03 DIAGNOSIS — N1831 Chronic kidney disease, stage 3a: Secondary | ICD-10-CM | POA: Insufficient documentation

## 2021-06-03 DIAGNOSIS — Z8249 Family history of ischemic heart disease and other diseases of the circulatory system: Secondary | ICD-10-CM

## 2021-06-03 DIAGNOSIS — I2782 Chronic pulmonary embolism: Secondary | ICD-10-CM | POA: Diagnosis present

## 2021-06-03 DIAGNOSIS — E1151 Type 2 diabetes mellitus with diabetic peripheral angiopathy without gangrene: Secondary | ICD-10-CM | POA: Insufficient documentation

## 2021-06-03 DIAGNOSIS — E1122 Type 2 diabetes mellitus with diabetic chronic kidney disease: Secondary | ICD-10-CM | POA: Insufficient documentation

## 2021-06-03 DIAGNOSIS — Z79899 Other long term (current) drug therapy: Secondary | ICD-10-CM | POA: Diagnosis not present

## 2021-06-03 DIAGNOSIS — I2699 Other pulmonary embolism without acute cor pulmonale: Secondary | ICD-10-CM | POA: Diagnosis present

## 2021-06-03 DIAGNOSIS — I10 Essential (primary) hypertension: Secondary | ICD-10-CM | POA: Diagnosis present

## 2021-06-03 DIAGNOSIS — I130 Hypertensive heart and chronic kidney disease with heart failure and stage 1 through stage 4 chronic kidney disease, or unspecified chronic kidney disease: Secondary | ICD-10-CM | POA: Insufficient documentation

## 2021-06-03 DIAGNOSIS — M009 Pyogenic arthritis, unspecified: Secondary | ICD-10-CM

## 2021-06-03 DIAGNOSIS — I5033 Acute on chronic diastolic (congestive) heart failure: Secondary | ICD-10-CM | POA: Diagnosis present

## 2021-06-03 DIAGNOSIS — Z833 Family history of diabetes mellitus: Secondary | ICD-10-CM | POA: Diagnosis not present

## 2021-06-03 DIAGNOSIS — Z8042 Family history of malignant neoplasm of prostate: Secondary | ICD-10-CM

## 2021-06-03 DIAGNOSIS — D75838 Other thrombocytosis: Secondary | ICD-10-CM | POA: Diagnosis present

## 2021-06-03 DIAGNOSIS — N189 Chronic kidney disease, unspecified: Secondary | ICD-10-CM | POA: Diagnosis not present

## 2021-06-03 DIAGNOSIS — Z8616 Personal history of COVID-19: Secondary | ICD-10-CM | POA: Insufficient documentation

## 2021-06-03 DIAGNOSIS — Z86718 Personal history of other venous thrombosis and embolism: Secondary | ICD-10-CM | POA: Insufficient documentation

## 2021-06-03 DIAGNOSIS — I82409 Acute embolism and thrombosis of unspecified deep veins of unspecified lower extremity: Secondary | ICD-10-CM | POA: Diagnosis present

## 2021-06-03 DIAGNOSIS — I5032 Chronic diastolic (congestive) heart failure: Secondary | ICD-10-CM | POA: Diagnosis not present

## 2021-06-03 DIAGNOSIS — L0390 Cellulitis, unspecified: Secondary | ICD-10-CM

## 2021-06-03 LAB — CBC WITH DIFFERENTIAL/PLATELET
Abs Immature Granulocytes: 0.01 10*3/uL (ref 0.00–0.07)
Basophils Absolute: 0 10*3/uL (ref 0.0–0.1)
Basophils Relative: 1 %
Eosinophils Absolute: 0.1 10*3/uL (ref 0.0–0.5)
Eosinophils Relative: 2 %
HCT: 47.4 % (ref 39.0–52.0)
Hemoglobin: 15.6 g/dL (ref 13.0–17.0)
Immature Granulocytes: 0 %
Lymphocytes Relative: 45 %
Lymphs Abs: 2 10*3/uL (ref 0.7–4.0)
MCH: 31.1 pg (ref 26.0–34.0)
MCHC: 32.9 g/dL (ref 30.0–36.0)
MCV: 94.4 fL (ref 80.0–100.0)
Monocytes Absolute: 0.4 10*3/uL (ref 0.1–1.0)
Monocytes Relative: 9 %
Neutro Abs: 1.9 10*3/uL (ref 1.7–7.7)
Neutrophils Relative %: 43 %
Platelets: 449 10*3/uL — ABNORMAL HIGH (ref 150–400)
RBC: 5.02 MIL/uL (ref 4.22–5.81)
RDW: 14.3 % (ref 11.5–15.5)
WBC: 4.4 10*3/uL (ref 4.0–10.5)
nRBC: 0 % (ref 0.0–0.2)

## 2021-06-03 LAB — COMPREHENSIVE METABOLIC PANEL
ALT: 40 U/L (ref 0–44)
AST: 36 U/L (ref 15–41)
Albumin: 3.7 g/dL (ref 3.5–5.0)
Alkaline Phosphatase: 52 U/L (ref 38–126)
Anion gap: 12 (ref 5–15)
BUN: 17 mg/dL (ref 6–20)
CO2: 20 mmol/L — ABNORMAL LOW (ref 22–32)
Calcium: 9.4 mg/dL (ref 8.9–10.3)
Chloride: 101 mmol/L (ref 98–111)
Creatinine, Ser: 1.45 mg/dL — ABNORMAL HIGH (ref 0.61–1.24)
GFR, Estimated: 58 mL/min — ABNORMAL LOW (ref >60)
Glucose, Bld: 121 mg/dL — ABNORMAL HIGH (ref 70–99)
Potassium: 4.3 mmol/L (ref 3.5–5.1)
Sodium: 133 mmol/L — ABNORMAL LOW (ref 135–145)
Total Bilirubin: 0.8 mg/dL (ref 0.3–1.2)
Total Protein: 8.8 g/dL — ABNORMAL HIGH (ref 6.5–8.1)

## 2021-06-03 LAB — C-REACTIVE PROTEIN: CRP: 0.7 mg/dL (ref <1.0)

## 2021-06-03 LAB — PROCALCITONIN: Procalcitonin: <0.1 ng/mL

## 2021-06-03 LAB — RESP PANEL BY RT-PCR (FLU A&B, COVID) ARPGX2
Influenza A by PCR: NEGATIVE
Influenza B by PCR: NEGATIVE
SARS Coronavirus 2 by RT PCR: NEGATIVE

## 2021-06-03 LAB — URINALYSIS, COMPLETE (UACMP) WITH MICROSCOPIC
Bacteria, UA: NONE SEEN
Bilirubin Urine: NEGATIVE
Glucose, UA: NEGATIVE mg/dL
Hgb urine dipstick: NEGATIVE
Ketones, ur: NEGATIVE mg/dL
Leukocytes,Ua: NEGATIVE
Nitrite: NEGATIVE
Protein, ur: 100 mg/dL — AB
Specific Gravity, Urine: 1.03 (ref 1.005–1.030)
pH: 5 (ref 5.0–8.0)

## 2021-06-03 LAB — APTT: aPTT: 35 seconds (ref 24–36)

## 2021-06-03 LAB — PROTIME-INR
INR: 1.9 — ABNORMAL HIGH (ref 0.8–1.2)
Prothrombin Time: 21.5 seconds — ABNORMAL HIGH (ref 11.4–15.2)

## 2021-06-03 LAB — LACTIC ACID, PLASMA
Lactic Acid, Venous: 2.5 mmol/L (ref 0.5–1.9)
Lactic Acid, Venous: 1.7 mmol/L (ref 0.5–1.9)

## 2021-06-03 LAB — BRAIN NATRIURETIC PEPTIDE: B Natriuretic Peptide: 8.1 pg/mL (ref 0.0–100.0)

## 2021-06-03 LAB — SEDIMENTATION RATE: Sed Rate: 6 mm/hr (ref 0–20)

## 2021-06-03 MED ORDER — ACETAMINOPHEN 325 MG PO TABS: 650.0000 mg | ORAL_TABLET | Freq: Four times a day (QID) | ORAL | Status: DC | PRN

## 2021-06-03 MED ORDER — ONDANSETRON HCL 4 MG/2ML IJ SOLN: 4.0000 mg | Freq: Three times a day (TID) | INTRAMUSCULAR | Status: DC | PRN

## 2021-06-03 MED ORDER — MORPHINE SULFATE (PF) 2 MG/ML IV SOLN
2.0000 mg | INTRAVENOUS | Status: DC | PRN
  Administered 2021-06-03 – 2021-06-06 (×5): 2 mg via INTRAVENOUS
  Filled 2021-06-03 (×5): qty 1

## 2021-06-03 MED ORDER — LOSARTAN POTASSIUM 50 MG PO TABS
50.0000 mg | ORAL_TABLET | Freq: Every day | ORAL | Status: DC
  Administered 2021-06-04 – 2021-06-07 (×4): 50 mg via ORAL
  Filled 2021-06-03 (×4): qty 1

## 2021-06-03 MED ORDER — VANCOMYCIN HCL IN DEXTROSE 1-5 GM/200ML-% IV SOLN
1000.0000 mg | Freq: Once | INTRAVENOUS | Status: AC
  Administered 2021-06-03: 1000 mg via INTRAVENOUS
  Filled 2021-06-03: qty 200

## 2021-06-03 MED ORDER — VANCOMYCIN HCL 1500 MG/300ML IV SOLN
1500.0000 mg | Freq: Two times a day (BID) | INTRAVENOUS | Status: DC
  Administered 2021-06-04: 1500 mg via INTRAVENOUS
  Filled 2021-06-03 (×3): qty 300

## 2021-06-03 MED ORDER — PIPERACILLIN-TAZOBACTAM 3.375 G IVPB 30 MIN
3.3750 g | Freq: Once | INTRAVENOUS | Status: AC
  Administered 2021-06-03: 3.375 g via INTRAVENOUS
  Filled 2021-06-03: qty 50

## 2021-06-03 MED ORDER — VANCOMYCIN HCL 1500 MG/300ML IV SOLN
1500.0000 mg | Freq: Once | INTRAVENOUS | Status: AC
  Administered 2021-06-03: 1500 mg via INTRAVENOUS
  Filled 2021-06-03: qty 300

## 2021-06-03 MED ORDER — SODIUM CHLORIDE 0.9 % IV SOLN
2.0000 g | INTRAVENOUS | Status: DC
  Administered 2021-06-03 – 2021-06-07 (×5): 2 g via INTRAVENOUS
  Filled 2021-06-03: qty 2
  Filled 2021-06-03: qty 20
  Filled 2021-06-03 (×3): qty 2

## 2021-06-03 MED ORDER — RIVAROXABAN 20 MG PO TABS
20.0000 mg | ORAL_TABLET | Freq: Every day | ORAL | Status: DC
  Administered 2021-06-04 – 2021-06-07 (×4): 20 mg via ORAL
  Filled 2021-06-03 (×4): qty 1

## 2021-06-03 MED ORDER — OXYCODONE-ACETAMINOPHEN 5-325 MG PO TABS
1.0000 | ORAL_TABLET | ORAL | Status: DC | PRN
Start: 2021-06-03 — End: 2021-06-07
  Administered 2021-06-03 – 2021-06-07 (×10): 1 via ORAL
  Filled 2021-06-03 (×10): qty 1

## 2021-06-03 MED ORDER — SODIUM CHLORIDE 0.9 % IV SOLN: INTRAVENOUS | Status: DC

## 2021-06-03 MED ORDER — HYDRALAZINE HCL 20 MG/ML IJ SOLN: 5.0000 mg | INTRAMUSCULAR | Status: DC | PRN

## 2021-06-03 MED ORDER — SODIUM CHLORIDE 0.9 % IV BOLUS
1000.0000 mL | Freq: Once | INTRAVENOUS | Status: AC
  Administered 2021-06-03: 1000 mL via INTRAVENOUS

## 2021-06-03 NOTE — ED Triage Notes (Signed)
Pt comes into the ED via POV c/o right foot pain and cellulitis of the foot.  Pt states he has completed his oral antibiotics and that informed him to come here because it wasn't getting any better.  Pt states it has gotten to the point where he cannot put any weight on the foot.  Pt has even and unlabored respirations at this time.

## 2021-06-03 NOTE — ED Notes (Signed)
Pharmacy tech at bedside 

## 2021-06-03 NOTE — Consult Note (Signed)
Pharmacy Antibiotic Note  Ryan [name] is a 50 y.o. male admitted on 06/03/2021 with right foot cellulitis. Pharmacy has been consulted for vancomycin dosing.  Plan: Vancomycin 2500 mg IV loading dose, followed by 1500 mg IV q12h Goal AUC 400-550  Est AUC: 502.6 Est Cmax: 28 Est Cmin: 16.4 Calculated with SCr 1.45, Vd 0.5  Pt is also ordered ceftriaxone 2 g IV q24h   Monitor clinical picture, renal function, and vancomycin levels at steady state F/U C&S, abx deescalation / LOT   Height: 5\' 7"  (170.2 cm) Weight: (!) 162.8 kg (358 lb 14.5 oz) IBW/kg (Calculated) : 66.1  Temp (24hrs), Avg:98.6 F (37 C), Min:98.6 F (37 C), Max:98.6 F (37 C)  Recent Labs  Lab 06/03/21 1209 06/03/21 1410  WBC 4.4  --   CREATININE 1.45*  --   LATICACIDVEN 2.5* 1.7    Estimated Creatinine Clearance: 89.3 mL/min (A) (by C-G formula based on SCr of 1.45 mg/dL (H)).    Allergies  Allergen Reactions   Iodinated Diagnostic Agents Other (See Comments)    Kidney failure requiring dialysis Kidney failure requiring dialysis Kidney failure requiring dialysis    Metrizamide Other (See Comments)    Kidney failure requiring dialysis Kidney failure requiring dialysis    Clindamycin/Lincomycin Rash   Lincomycin Rash   Sulfa Antibiotics Rash    Antimicrobials this admission: 10/5 Zosyn x 1 10/5 vancomycin >>  10/5 ceftriaxone >>   Dose adjustments this admission: N/A  Microbiology results: 10/5 BCx: Pending  Thank you for allowing pharmacy to be a part of this patient's care.  08/03/21, PharmD 06/03/2021 3:44 PM

## 2021-06-03 NOTE — ED Provider Notes (Signed)
[facility] Emergency Department Provider Note ____________________________________________   Event Date/Time   First MD Initiated Contact with Patient 06/03/21 1303     (approximate)  I have reviewed the triage vital signs and the nursing notes.   HISTORY  Chief Complaint Foot Pain    HPI MYKAI WENDORF is a 50 y.o. male with PMH as noted below who presents with right foot pain over the last 2 to 3 weeks, atraumatic, and associated with some swelling and redness.  He was seen at the [facility] ED on 9/26 for this and diagnosed with likely cellulitis.  He has been taking Keflex with no relief.  The patient denies any fever or chills.  He states that the pain goes up to just above the ankle.   Past Medical History:  Diagnosis Date   CHF (congestive heart failure) (HCC)    Diabetes mellitus without complication (HCC)    per pt-pre diabetic-Dr Dario Guardian stated said pt   DVT (deep venous thrombosis) (HCC)    Hypertension    Peripheral vascular disease (HCC)    Presence of IVC filter     Patient Active Problem List   Diagnosis Date Noted   Cellulitis of right foot 06/03/2021   Type II diabetes mellitus with renal manifestations (HCC) 06/03/2021   Knee pain 03/11/2020   Weakness of both lower limbs 11/17/2017   Diabetes (HCC) 08/03/2017   CAP (community acquired pneumonia) 08/03/2017   CKD (chronic kidney disease), stage IIIa 08/03/2017   Pulmonary embolus (HCC) 01/03/2017   SOB (shortness of breath) 12/20/2016   Chronic diastolic CHF (congestive heart failure) (HCC) 12/15/2016   HTN (hypertension) 12/15/2016   Fatigue 12/15/2016   PVC (premature ventricular contraction) 11/24/2016   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   Obesity, Class III, BMI 40-49.9 (morbid obesity) (HCC) 11/24/2016   Pulmonary emboli (HCC) 11/22/2016   DVT (deep venous thrombosis) (HCC) 12/28/2014   Hepatic steatosis 05/30/2013   Morbid obesity with BMI of 50.0-59.9, adult (HCC)  05/30/2013   Proteinuria 05/30/2013    Past Surgical History:  Procedure Laterality Date   PERIPHERAL VASCULAR CATHETERIZATION N/A 01/10/2015   Procedure: Dialysis/Perma Catheter Insertion;  Surgeon: Renford Dills, MD;  Location: [facility] INVASIVE CV LAB;  Service: Cardiovascular;  Laterality: N/A;   PERIPHERAL VASCULAR CATHETERIZATION N/A 02/24/2015   Procedure: Dialysis/Perma Catheter Removal;  Surgeon: Annice Needy, MD;  Location: [facility] INVASIVE CV LAB;  Service: Cardiovascular;  Laterality: N/A;    Prior to Admission medications   Medication Sig Start Date End Date Taking? Authorizing Provider  cephALEXin (KEFLEX) 500 MG capsule Take 500 mg by mouth 4 (four) times daily. 05/25/21  Yes [provider]  chlorthalidone (HYGROTON) 25 MG tablet Take 25 mg by mouth daily. 05/29/21  Yes [provider]  losartan (COZAAR) 50 MG tablet Take 50 mg by mouth daily. 03/11/20  Yes [provider]  RIVAROXABAN Carlena Hurl) VTE STARTER PACK (15 & 20 MG TABLETS) Follow package directions: Take one 15mg  tablet by mouth twice a day. On day 22, switch to one 20mg  tablet once a day. Take with food. 01/27/20  Yes , MD  diltiazem [facility]) 120 MG 24 hr capsule Take 1 capsule by mouth daily. Patient not taking: Reported on 06/03/2021 10/16/20 10/16/21  [provider]  hydrochlorothiazide (HYDRODIURIL) 25 MG tablet Take 1 tablet (25 mg total) by mouth daily. 01/27/20 03/11/20  01/29/20, MD  lisinopril (ZESTRIL) 10 MG tablet Take 10 mg by mouth daily. Patient not taking: No  sig reported    [provider]  metoprolol (TOPROL-XL) 200 MG 24 hr tablet Take 1 tablet by mouth in the morning and at bedtime. Patient not taking: Reported on 06/03/2021 10/08/20   [provider]  metoprolol tartrate (LOPRESSOR) 25 MG tablet Take 25 mg by mouth 2 (two) times daily. Patient not taking: Reported on 06/03/2021 04/09/20   [provider]  MULTAQ 400 MG tablet Take 400 mg by  mouth 2 (two) times daily. Patient not taking: Reported on 06/03/2021 05/29/21   [provider]  rivaroxaban (XARELTO) 20 MG TABS tablet Take 1 tablet (20 mg total) by mouth daily with supper. New blood thinner. 02/16/20 04/16/20  Darlin Priestly, MD    Allergies Iodinated diagnostic agents, Metrizamide, Clindamycin/lincomycin, Lincomycin, and Sulfa antibiotics  Family History  Problem Relation Age of Onset   Hypertension Mother    Cancer Mother    Breast cancer Mother    Prostate cancer Father    Hypertension Father    Diabetes Father     Social History Social History   Tobacco Use   Smoking status: Never   Smokeless tobacco: Never  Vaping Use   Vaping Use: Never used  Substance Use Topics   Alcohol use: No   Drug use: No    Review of Systems  Constitutional: No fever/chills Eyes: No visual changes. ENT: No sore throat. Cardiovascular: Denies chest pain. Respiratory: Denies shortness of breath. Gastrointestinal: No nausea, no vomiting.  No diarrhea.  Genitourinary: Negative for dysuria.  Musculoskeletal: Negative for back pain.  Positive for right foot pain. Skin: Negative for rash. Neurological: Negative for headaches, focal weakness or numbness.   ____________________________________________   PHYSICAL EXAM:  VITAL SIGNS: ED Triage Vitals  Enc Vitals Group     BP 06/03/21 1205 (!) 139/102     Pulse Rate 06/03/21 1205 (!) 118     Resp 06/03/21 1205 18     Temp 06/03/21 1205 98.6 F (37 C)     Temp Source 06/03/21 1205 Oral     SpO2 06/03/21 1205 95 %     Weight 06/03/21 1206 (!) 358 lb 14.5 oz (162.8 kg)     Height 06/03/21 1206 5\' 7"  (1.702 m)     Head Circumference --      Peak Flow --      Pain Score 06/03/21 1206 8     Pain Loc --      Pain Edu? --      Excl. in GC? --     Constitutional: Alert and oriented.  Relatively well appearing and in no acute distress. Eyes: Conjunctivae are normal.  Head: Atraumatic. Nose: No  congestion/rhinnorhea. Mouth/Throat: Mucous membranes are moist.   Neck: Normal range of motion.  Cardiovascular: Normal rate, regular rhythm. Good peripheral circulation. Respiratory: Normal respiratory effort.  No retractions.  Gastrointestinal: No distention.  Musculoskeletal: 1+ bilateral lower extremity edema.  Extremities warm and well perfused.  Right foot with erythema, induration, and slight warmth over the dorsum.  8 mm superficial skin tear to the dorsal lateral aspect of the right foot with no large open wound or ulcer.  Cap refill less than 2 seconds.  Faint bilateral DP pulses detectable with Doppler. Neurologic:  Normal speech and language. No gross focal neurologic deficits are appreciated.  Skin:  Skin is warm and dry. No rash noted. Psychiatric: Mood and affect are normal. Speech and behavior are normal.  ____________________________________________   LABS (all labs ordered are listed, but only abnormal  results are displayed)  Labs Reviewed  LACTIC ACID, PLASMA - Abnormal; Notable for the following components:      Result Value   Lactic Acid, Venous 2.5 (*)    All other components within normal limits  COMPREHENSIVE METABOLIC PANEL - Abnormal; Notable for the following components:   Sodium 133 (*)    CO2 20 (*)    Glucose, Bld 121 (*)    Creatinine, Ser 1.45 (*)    Total Protein 8.8 (*)    GFR, Estimated 58 (*)    All other components within normal limits  CBC WITH DIFFERENTIAL/PLATELET - Abnormal; Notable for the following components:   Platelets 449 (*)    All other components within normal limits  CULTURE, BLOOD (ROUTINE X 2)  CULTURE, BLOOD (ROUTINE X 2)  RESP PANEL BY RT-PCR (FLU A&B, COVID) ARPGX2  LACTIC ACID, PLASMA  URINALYSIS, COMPLETE (UACMP) WITH MICROSCOPIC  SEDIMENTATION RATE  C-REACTIVE PROTEIN  BRAIN NATRIURETIC PEPTIDE  PROTIME-INR  APTT  PROCALCITONIN    ____________________________________________  EKG   ____________________________________________  RADIOLOGY  XR R foot: No acute abnormality  ____________________________________________   PROCEDURES  Procedure(s) performed: No  Procedures  Critical Care performed: No ____________________________________________   INITIAL IMPRESSION / ASSESSMENT AND PLAN / ED COURSE  Pertinent labs & imaging results that were available during my care of the patient were reviewed by me and considered in my medical decision making (see chart for details).   50 year old male with PMH as noted above including history of diabetes and peripheral vascular disease presents with 2 to 3 weeks of right foot pain and swelling with no preceding history of trauma.  I reviewed the past medical records in Care Everywhere and confirmed that the patient was seen in the ED at [facility] on 9/26 and diagnosed with cellulitis.  He was started on Keflex.  He was ruled out for DVT at that time, and x-rays were negative for evidence of osteomyelitis.  On exam the patient is overall well-appearing.  He is hypertensive and was slightly tachycardic at triage with otherwise normal vital signs.  Exam of the right foot is as described above.  Both lower extremities are neuro/vascular intact.  Initial lab work-up reveals elevated lactate but no leukocytosis.  Differential includes persistent right foot cellulitis, osteomyelitis, edema related to vascular insufficiency, gout, or other inflammatory process.  There is no evidence of acute vascular occlusion or limb ischemia.  We will obtain a repeat x-ray, give fluids and empiric antibiotics, and plan for admission.  ----------------------------------------- 3:29 PM on 06/03/2021 -----------------------------------------  X-rays negative for evidence of osteomyelitis.  I consulted Dr. Clyde Lundborg from the hospitalist service for  admission.   ____________________________________________   FINAL CLINICAL IMPRESSION(S) / ED DIAGNOSES  Final diagnoses:  Cellulitis  Cellulitis of right foot      NEW MEDICATIONS STARTED DURING THIS VISIT:  New Prescriptions   No medications on file     Note:  This document was prepared using Dragon voice recognition software and may include unintentional dictation errors.    Dionne Bucy, MD 06/03/21 1529

## 2021-06-03 NOTE — ED Notes (Signed)
Blood cultures and lactic acid sent to lab att his time.

## 2021-06-03 NOTE — H&P (Signed)
History and Physical    Ryan [name] MVE:[id number] DOB: 23-Mar-1971 DOA: 06/03/2021  Referring MD/NP/PA:   PCP: Casilda Carls, MD   Patient coming from:  The patient is coming from home.  At baseline, pt is independent for most of ADL.        Chief Complaint: right foot pain  HPI: Ryan [name] is a 50 y.o. male with medical history significant of HTN, pre-DM, DVT/PE on Xarelto, dCHF,, PVD, CKD stage IIIa, atrial fibrillation (s/p of ablation per pt, triggered by COVID infection), who presents with right foot pain.  Patient states that he has right foot pain due to infection for more than 3 weeks.  He was seen at the [facility] ED on 9/26 and started on Keflex, without improvement. He states that his right foot pain has been progressively worsening.  Right foot is erythematous, swollen and painful.  The pain is constant, severe, sharp, nonradiating.  Patient states that initially he had some fever and chills, which has resolved.  Currently no fever or chills.  Patient does not have chest pain, cough, shortness breath.  No nausea, vomiting, diarrhea or abdominal pain.  No symptoms of UTI. Pt states that he is currently taking 20 mg of Xarelto daily, last dose was this morning.    ED Course: pt was found to have WBC 4.4, lactic acid 2.5, negative COVID PCR, renal function close to baseline, temperature normal, blood pressure 160/113, heart rate 118, RR 18, oxygen saturation 93-97% on room air.  X-ray of right foot is negative for osteomyelitis.  Patient is admitted to [place] bed as inpatient  Review of Systems:   General: no fevers, chills, no body weight gain, has fatigue HEENT: no blurry vision, hearing changes or sore throat Respiratory: no dyspnea, coughing, wheezing CV: no chest pain, no palpitations GI: no nausea, vomiting, abdominal pain, diarrhea, constipation GU: no dysuria, burning on urination, increased urinary frequency, hematuria  Ext: Has chronic venous insufficiency change in  both legs, has right foot pain and swelling Neuro: no unilateral weakness, numbness, or tingling, no vision change or hearing loss Skin: no rash, no skin tear. MSK: No muscle spasm, no deformity, no limitation of range of movement in spin Heme: No easy bruising.  Travel history: No recent long distant travel.  Allergy:  Allergies  Allergen Reactions   Iodinated Diagnostic Agents Other (See Comments)    Kidney failure requiring dialysis Kidney failure requiring dialysis Kidney failure requiring dialysis    Metrizamide Other (See Comments)    Kidney failure requiring dialysis Kidney failure requiring dialysis    Clindamycin/Lincomycin Rash   Lincomycin Rash   Sulfa Antibiotics Rash    Past Medical History:  Diagnosis Date   CHF (congestive heart failure) (HCC)    Diabetes mellitus without complication ([place])    per pt-pre diabetic-Dr Rosario Jacks stated said pt   DVT (deep venous thrombosis) ([place])    Hypertension    Peripheral vascular disease ([place])    Presence of IVC filter     Past Surgical History:  Procedure Laterality Date   PERIPHERAL VASCULAR CATHETERIZATION N/A 01/10/2015   Procedure: Dialysis/Perma Catheter Insertion;  Surgeon: Katha Cabal, MD;  Location: [facility];  Service: Cardiovascular;  Laterality: N/A;   PERIPHERAL VASCULAR CATHETERIZATION N/A 02/24/2015   Procedure: Dialysis/Perma Catheter Removal;  Surgeon: Algernon Huxley, MD;  Location: [facility];  Service: Cardiovascular;  Laterality: N/A;    Social History:  reports that he has never smoked. He  has never used smokeless tobacco. He reports that he does not drink alcohol and does not use drugs.  Family History:  Family History  Problem Relation Age of Onset   Hypertension Mother    Cancer Mother    Breast cancer Mother    Prostate cancer Father    Hypertension Father    Diabetes Father      Prior to Admission medications   Medication Sig Start Date End Date Taking? Authorizing  Provider  cephALEXin (KEFLEX) 500 MG capsule Take 500 mg by mouth 4 (four) times daily. 05/25/21  Yes [provider]  chlorthalidone (HYGROTON) 25 MG tablet Take 25 mg by mouth daily. 05/29/21  Yes [provider]  losartan (COZAAR) 50 MG tablet Take 50 mg by mouth daily. 03/11/20  Yes [provider]  RIVAROXABAN Alveda Reasons) VTE STARTER PACK (15 & 20 MG TABLETS) Follow package directions: Take one 78m tablet by mouth twice a day. On day 22, switch to one 274mtablet once a day. Take with food. 01/27/20  Yes LaEnzo BiMD  diltiazem ([facility] MG 24 hr capsule Take 1 capsule by mouth daily. Patient not taking: Reported on 06/03/2021 10/16/20 10/16/21  [provider]  hydrochlorothiazide (HYDRODIURIL) 25 MG tablet Take 1 tablet (25 mg total) by mouth daily. 01/27/20 03/11/20  LaEnzo BiMD  lisinopril (ZESTRIL) 10 MG tablet Take 10 mg by mouth daily. Patient not taking: No sig reported    [provider]  metoprolol (TOPROL-XL) 200 MG 24 hr tablet Take 1 tablet by mouth in the morning and at bedtime. Patient not taking: Reported on 06/03/2021 10/08/20   [provider]  metoprolol tartrate (LOPRESSOR) 25 MG tablet Take 25 mg by mouth 2 (two) times daily. Patient not taking: Reported on 06/03/2021 04/09/20   [provider]  MULTAQ 400 MG tablet Take 400 mg by mouth 2 (two) times daily. Patient not taking: Reported on 06/03/2021 05/29/21   [provider]  rivaroxaban (XARELTO) 20 MG TABS tablet Take 1 tablet (20 mg total) by mouth daily with supper. New blood thinner. 02/16/20 04/16/20  LaEnzo BiMD    Physical Exam: Vitals:   06/03/21 1206 06/03/21 1307 06/03/21 1418 06/03/21 1530  BP:  (!) 157/111 (!) 160/113 (!) 144/88  Pulse:  (!) 110 92 97  Resp:  18 18 18   Temp:      TempSrc:      SpO2:  93% 97% 97%  Weight: (!) 162.8 kg     Height: 5' 7"  (1.702 m)      General: Not in acute distress HEENT:       Eyes: PERRL, EOMI, no  scleral icterus.       ENT: No discharge from the ears and nose, no pharynx injection, no tonsillar enlargement.        Neck: No JVD, no bruit, no mass felt. Heme: No neck lymph node enlargement. Cardiac: S1/S2, RRR, No murmurs, No gallops or rubs. Respiratory: No rales, wheezing, rhonchi or rubs. GI: Soft, nondistended, nontender, no rebound pain, no organomegaly, BS present. GU: No hematuria Ext: 1+DP/PT pulse bilaterally.  Has severe chronic venous insufficiency change in both legs, right foot is swollen, tender, erythematous and warm.     Musculoskeletal: No joint deformities, No joint redness or warmth, no limitation of ROM in spin. Skin: No rashes.  Neuro: Alert, oriented X3, cranial nerves II-XII grossly intact, moves all extremities normally. Psych: Patient is not psychotic, no suicidal or hemocidal ideation.  Labs  on Admission: I have personally reviewed following labs and imaging studies  CBC: Recent Labs  Lab 06/03/21 1209  WBC 4.4  NEUTROABS 1.9  HGB 15.6  HCT 47.4  MCV 94.4  PLT 412*   Basic Metabolic Panel: Recent Labs  Lab 06/03/21 1209  NA 133*  K 4.3  CL 101  CO2 20*  GLUCOSE 121*  BUN 17  CREATININE 1.45*  CALCIUM 9.4   GFR: Estimated Creatinine Clearance: 89.3 mL/min (A) (by C-G formula based on SCr of 1.45 mg/dL (H)). Liver Function Tests: Recent Labs  Lab 06/03/21 1209  AST 36  ALT 40  ALKPHOS 52  BILITOT 0.8  PROT 8.8*  ALBUMIN 3.7   No results for input(s): LIPASE, AMYLASE in the last 168 hours. No results for input(s): AMMONIA in the last 168 hours. Coagulation Profile: Recent Labs  Lab 06/03/21 1551  INR 1.9*   Cardiac Enzymes: No results for input(s): CKTOTAL, CKMB, CKMBINDEX, TROPONINI in the last 168 hours. BNP (last 3 results) No results for input(s): PROBNP in the last 8760 hours. HbA1C: No results for input(s): HGBA1C in the last 72 hours. CBG: No results for input(s): GLUCAP in the last 168 hours. Lipid  Profile: No results for input(s): CHOL, HDL, LDLCALC, TRIG, CHOLHDL, LDLDIRECT in the last 72 hours. Thyroid Function Tests: No results for input(s): TSH, T4TOTAL, FREET4, T3FREE, THYROIDAB in the last 72 hours. Anemia Panel: No results for input(s): VITAMINB12, FOLATE, FERRITIN, TIBC, IRON, RETICCTPCT in the last 72 hours. Urine analysis:    Component Value Date/Time   COLORURINE YELLOW (A) 06/03/2021 1551   APPEARANCEUR CLEAR (A) 06/03/2021 1551   APPEARANCEUR Hazy 12/25/2014 1655   LABSPEC 1.030 06/03/2021 1551   LABSPEC 1.012 12/25/2014 1655   PHURINE 5.0 06/03/2021 1551   GLUCOSEU NEGATIVE 06/03/2021 1551   GLUCOSEU Negative 12/25/2014 1655   HGBUR NEGATIVE 06/03/2021 [place] 06/03/2021 1551   BILIRUBINUR Negative 12/25/2014 [place] 06/03/2021 1551   PROTEINUR 100 (A) 06/03/2021 1551   NITRITE NEGATIVE 06/03/2021 1551   LEUKOCYTESUR NEGATIVE 06/03/2021 1551   LEUKOCYTESUR Negative 12/25/2014 1655   Sepsis Labs: @LABRCNTIP (procalcitonin:4,lacticidven:4) ) Recent Results (from the past 240 hour(s))  Resp Panel by RT-PCR (Flu A&B, Covid) Nasopharyngeal Swab     Status: None   Collection Time: 06/03/21  3:51 PM   Specimen: Nasopharyngeal Swab; Nasopharyngeal(NP) swabs in vial transport medium  Result Value Ref Range Status   SARS Coronavirus 2 by RT PCR NEGATIVE NEGATIVE Final    Comment: (NOTE) SARS-CoV-2 target nucleic acids are NOT DETECTED.  The SARS-CoV-2 RNA is generally detectable in upper respiratory specimens during the acute phase of infection. The lowest concentration of SARS-CoV-2 viral copies this assay can detect is 138 copies/mL. A negative result does not preclude SARS-Cov-2 infection and should not be used as the sole basis for treatment or other patient management decisions. A negative result may occur with  improper specimen collection/handling, submission of specimen other than nasopharyngeal swab, presence of  viral mutation(s) within the areas targeted by this assay, and inadequate number of viral copies(<138 copies/mL). A negative result must be combined with clinical observations, patient history, and epidemiological information. The expected result is Negative.  Fact Sheet for Patients:  EntrepreneurPulse.com.au  Fact Sheet for Healthcare Providers:  IncredibleEmployment.be  This test is no t yet approved or cleared by the [place] FDA and  has been authorized for detection and/or diagnosis of SARS-CoV-2 by FDA under an Emergency Use Authorization (EUA).  This EUA will remain  in effect (meaning this test can be used) for the duration of the COVID-19 declaration under Section 564(b)(1) of the Act, 21 U.S.C.section 360bbb-3(b)(1), unless the authorization is terminated  or revoked sooner.       Influenza A by PCR NEGATIVE NEGATIVE Final   Influenza B by PCR NEGATIVE NEGATIVE Final    Comment: (NOTE) The Xpert Xpress SARS-CoV-2/FLU/RSV plus assay is intended as an aid in the diagnosis of influenza from Nasopharyngeal swab specimens and should not be used as a sole basis for treatment. Nasal washings and aspirates are unacceptable for Xpert Xpress SARS-CoV-2/FLU/RSV testing.  Fact Sheet for Patients: EntrepreneurPulse.com.au  Fact Sheet for Healthcare Providers: IncredibleEmployment.be  This test is not yet approved or cleared by the [place] FDA and has been authorized for detection and/or diagnosis of SARS-CoV-2 by FDA under an Emergency Use Authorization (EUA). This EUA will remain in effect (meaning this test can be used) for the duration of the COVID-19 declaration under Section 564(b)(1) of the Act, 21 U.S.C. section 360bbb-3(b)(1), unless the authorization is terminated or revoked.  Performed at [facility], [address] [place], [place] [address]      Radiological Exams on  Admission: DG Foot 2 Views Right  Result Date: 06/03/2021 CLINICAL DATA:  Right foot pain. EXAM: RIGHT FOOT - 2 VIEW COMPARISON:  None. FINDINGS: There is no evidence of fracture or dislocation. There is no evidence of arthropathy or other focal bone abnormality. Soft tissues are unremarkable. IMPRESSION: Negative. Electronically Signed   By: Marijo Conception M.D.   On: 06/03/2021 15:05     EKG: I have personally reviewed.  Sinus rhythm, QTC 449, low voltage, LAE, nonspecific to change  Assessment/Plan Principal Problem:   Cellulitis of right foot Active Problems:   DVT (deep venous thrombosis) (HCC)   Chronic diastolic CHF (congestive heart failure) (HCC)   HTN (hypertension)   Pulmonary embolus (HCC)   CKD (chronic kidney disease), stage IIIa   Cellulitis of right foot: Patient has elevated lactic acid 2.5, but no fever, no leukocytosis.  Has tachycardia with heart rate 118, but no tachypnea, does not meet critical sepsis, but patient is at risk of developing sepsis.  - Admitted to MedSurg bed as inpatient - Empiric antimicrobial treatment with vancomycin and Rocephin - PRN Zofran for nausea, morphine and Percocet for pain - Blood cultures x 2  - ESR and CRP - trend lactic acid levels - IVF: 1L of NS bolus in ED, followed by 75 cc/h - left LE doppler to r/o DVT  Hx of chronic PE and DVT (deep venous thrombosis) (HCC) -on Xarelto  Chronic diastolic CHF (congestive heart failure) ([place]): 2D echo on 08/06/2017 showed EF of 55-65%.  Patient does not have shortness breath, BNP 8.1, CHF is compensated. -Watch volume status closely  HTN (hypertension) -IV hydralazine as needed -Hold Hygroton since patient need IV fluid -Continue Cozaar  CKD-3A: Stable.  Baseline creatinine 1.3-1.6.  His creatinine is 1.45, BUN 17 -Follow-up by BMP    DVT ppx: on Xarelto Code Status: Full code Family Communication: not done, no family member is at bed side.   Disposition Plan:  Anticipate  discharge back to previous environment Consults called:  none Admission status and Level of care: Med-Surg:   as inpt         Status is: Inpatient  Remains inpatient appropriate because:Inpatient level of care appropriate due to severity of illness  Dispo: The patient is from: Home  Anticipated d/c is to: Home              Patient currently is not medically stable to d/c.   Difficult to place patient No          Date of Service 06/03/2021    Ivor Costa [facility]   If 7PM-7AM, please contact night-coverage [email or web address] 06/03/2021, 5:50 PM

## 2021-06-04 DIAGNOSIS — N183 Chronic kidney disease, stage 3 unspecified: Secondary | ICD-10-CM | POA: Diagnosis not present

## 2021-06-04 DIAGNOSIS — I825Z3 Chronic embolism and thrombosis of unspecified deep veins of distal lower extremity, bilateral: Secondary | ICD-10-CM | POA: Diagnosis not present

## 2021-06-04 DIAGNOSIS — L03115 Cellulitis of right lower limb: Secondary | ICD-10-CM | POA: Diagnosis not present

## 2021-06-04 DIAGNOSIS — I5032 Chronic diastolic (congestive) heart failure: Secondary | ICD-10-CM | POA: Diagnosis not present

## 2021-06-04 LAB — BASIC METABOLIC PANEL
Anion gap: 4 — ABNORMAL LOW (ref 5–15)
BUN: 18 mg/dL (ref 6–20)
CO2: 25 mmol/L (ref 22–32)
Calcium: 8.7 mg/dL — ABNORMAL LOW (ref 8.9–10.3)
Chloride: 106 mmol/L (ref 98–111)
Creatinine, Ser: 1.29 mg/dL — ABNORMAL HIGH (ref 0.61–1.24)
GFR, Estimated: >60 mL/min (ref >60)
Glucose, Bld: 112 mg/dL — ABNORMAL HIGH (ref 70–99)
Potassium: 4.5 mmol/L (ref 3.5–5.1)
Sodium: 135 mmol/L (ref 135–145)

## 2021-06-04 LAB — CBC
HCT: 42.1 % (ref 39.0–52.0)
Hemoglobin: 14 g/dL (ref 13.0–17.0)
MCH: 31.7 pg (ref 26.0–34.0)
MCHC: 33.3 g/dL (ref 30.0–36.0)
MCV: 95.2 fL (ref 80.0–100.0)
Platelets: 402 10*3/uL — ABNORMAL HIGH (ref 150–400)
RBC: 4.42 MIL/uL (ref 4.22–5.81)
RDW: 14.4 % (ref 11.5–15.5)
WBC: 5.6 10*3/uL (ref 4.0–10.5)
nRBC: 0 % (ref 0.0–0.2)

## 2021-06-04 LAB — HIV ANTIBODY (ROUTINE TESTING W REFLEX): HIV Screen 4th Generation wRfx: NONREACTIVE

## 2021-06-04 MED ORDER — VANCOMYCIN HCL IN DEXTROSE 1-5 GM/200ML-% IV SOLN
1000.0000 mg | Freq: Two times a day (BID) | INTRAVENOUS | Status: DC
  Administered 2021-06-04 – 2021-06-07 (×6): 1000 mg via INTRAVENOUS
  Filled 2021-06-04 (×9): qty 200

## 2021-06-04 MED ORDER — DICLOFENAC SODIUM 1 % EX GEL
2.0000 g | Freq: Four times a day (QID) | CUTANEOUS | Status: DC
  Administered 2021-06-04 – 2021-06-07 (×13): 2 g via TOPICAL
  Filled 2021-06-04: qty 100

## 2021-06-04 MED ORDER — SODIUM CHLORIDE 0.9 % IV SOLN: INTRAVENOUS | Status: AC

## 2021-06-04 NOTE — Progress Notes (Signed)
PROGRESS NOTE    Ryan [name]  ZOX:[id number] DOB: 1971/01/03 DOA: 06/03/2021 PCP: Sherrie Mustache, MD   Brief Narrative:  The patient is a 50 year old super morbidly obese African-American male with a past medical history significant for but not limited to hypertension, prediabetes mellitus, history of DVT and PE on Xarelto, history of diastolic CHF that is chronic, PVD, CKD stage IIIa, history of atrial fibrillation status post ablation as well as other comorbidities who presented with right foot pain.  He states that he has had right foot pain due to infection for more than 3 weeks.  He was seen in [facility] ED on 05/26/2019 was started on Keflex without improvement.  At that time he had a venous duplex done which showed no DVT per report.  He states that his right foot has been progressively worsening and has become more erythematous, swollen and painful.  The pain was constant and severe described as sharp nonradiating.  He states that he initially had some fevers and chills which have resolved.  He has not had no symptoms of chest pain, shortness of breath or cough or nausea or vomiting and no symptoms of UTI.  He is currently taking anticoagulation with Xarelto daily.  He presented to the ED was found to have a WBC of 4.4.  X-ray was done and did not show any evidence of osteomyelitis and he was admitted to MedSurg for acute cellulitis failing outpatient antibiotics.  He is currently on IV vancomycin and ceftriaxone improving but still has some foot pain.  Assessment & Plan:   Principal Problem:   Cellulitis of right foot Active Problems:   DVT (deep venous thrombosis) (HCC)   Chronic diastolic CHF (congestive heart failure) (HCC)   HTN (hypertension)   Pulmonary embolus (HCC)   CKD (chronic kidney disease), stage IIIa  Right Foot Cellulitis with associated right foot pain, improving  -Failed outpatient Treatment with po Keflex and he was seen at [facility] ED on 05/25/2021 and was started on p.o.  Keflex without improvement -Patient states the pain in his foot has progressively gotten worse and right foot was erythematous, painful and swollen -Lactic acid was elevated on admission at 2.5 then trended down to 1.7 -CRP was 0.7 and procalcitonin was less than 0.10 -Blood cultures x2 is pending -DG of the right foot showed "There is no evidence of fracture or dislocation. There is no evidence of arthropathy or other focal bone abnormality. Soft tissues are unremarkable." -May consider MRI if not improving further or additional studies with a CT scan -Recently had a venous duplex at [facility] about a week ago which she self-reports is negative -Currently getting IV fluid hydration with normal saline at 75 MLS per hour and will stop after 12 hours -Continue with empiric antibiotics with IV vancomycin and IV ceftriaxone and de-escalate accordingly -Continue with pain control with oxycodone-acetaminophen 1 tab p.o. every 4 as needed for moderate pain as well as IV morphine 2 mg every 4 as needed severe pain -Patient continues to have foot pain so we will add diclofenac 2 g topically 4 times daily   History of A. fib status post ablation -Triggered by COVID infection previously -If necessary will place on telemetry but remained in sinus rhythm -He is no longer taking diltiazem 120 mg every 24 hours or Multaq 400 g p.o. twice daily  Hx of Chronic PE and bilateral DVT -Anticoagulated with Xarelto  CKD Stabge 3a -Patient's BUN/Cr went from 17/1.45 -> 18/1.29 -Avoid nephrotoxic medications, contrast dyes,  hypotension and renally adjust medications -Continue to monitor and trend and repeat CMP in a.m.  HTN -Continue with losartan 50 mg p.o. daily and if necessary patient has IV hydralazine 5 mg every 2 as needed for systolic blood pressure greater than 165 -We will monitor blood pressures per protocol -We will hold his chlorthalidone 25 mg p.o. daily; no longer taking metoprolol or  hydrochlorothiazide -Last blood pressure reading was 37/94  Peripheral vascular disease -On rivaroxaban 20 mg p.o. daily does not appear to be on a statin or ASA  -Sees vascular surgery Dr. Drucilla Schmidt in outpatient setting and will need to follow-up with him within 1 to 2 weeks  Prediabetes -Blood sugar on admission was 121 and repeat this morning was 112 -Continue monitor blood sugars carefully and if necessary will place on sensitive NovoLog/scale insulin -Last documented hemoglobin A1c in our system was 6.4 back in 2018 we will repeat in the morning  Chronic Diastolic CHF -BNP was 8.1 but not of much value given the patient is super morbidly obese -Strict I's and O's and daily weights is that he is getting IV fluid hydration -Continue monitor for signs and symptoms of volume overload; patient is +1.863 L since admission  Thrombocytosis -Likely reactive in the setting of infection as above -Platelet count from 449 and trended down to 4 2 -Continue monitor and trend and repeat CBC in a.m.  Super Morbid Obesity -Complicates overall prognosis and care -Estimated body mass index is 56.21 kg/m as calculated from the following:   Height as of this encounter: 5\' 7"  (1.702 m).   Weight as of this encounter: 162.8 kg. -Weight Loss and Dietary Counseling give    DVT prophylaxis: Anticoagulated with Xarelto Code Status: FULL CODE Family Communication: No family currently at bedside Disposition Plan: Pending further clinical improvement anticipating discharge in next 24 to 48 hours  Status is: Inpatient  Remains inpatient appropriate because:Unsafe d/c plan, IV treatments appropriate due to intensity of illness or inability to take PO, and Inpatient level of care appropriate due to severity of illness  Dispo: The patient is from: Home              Anticipated d/c is to: Home              Patient currently is not medically stable to d/c.   Difficult to place patient No  Consultants:   None  Procedures: None  Antimicrobials:  Anti-infectives (From admission, onward)    Start     Dose/Rate Route Frequency Ordered Stop   06/04/21 0400  vancomycin (VANCOREADY) IVPB 1500 mg/300 mL        1,500 mg 150 mL/hr over 120 Minutes Intravenous Every 12 hours 06/03/21 1556     06/03/21 1600  cefTRIAXone (ROCEPHIN) 2 g in sodium chloride 0.9 % 100 mL IVPB        2 g 200 mL/hr over 30 Minutes Intravenous Every 24 hours 06/03/21 1517     06/03/21 1600  vancomycin (VANCOREADY) IVPB 1500 mg/300 mL        1,500 mg 150 mL/hr over 120 Minutes Intravenous  Once 06/03/21 1548 06/03/21 1939   06/03/21 1400  vancomycin (VANCOCIN) IVPB 1000 mg/200 mL premix        1,000 mg 200 mL/hr over 60 Minutes Intravenous  Once 06/03/21 1351 06/03/21 1551   06/03/21 1400  piperacillin-tazobactam (ZOSYN) IVPB 3.375 g        3.375 g 100 mL/hr over 30 Minutes Intravenous  Once 06/03/21  1351 06/03/21 1440        Subjective: In and examined at bedside and states that his foot is still very painful and swollen states that it is improving and much improved since he came in.  Does not think it is as red.  States that he feels he has some "leaky valves".  States he needs to follow-up with his vascular surgeon outpatient setting.  No chest pain or shortness of breath.  No other concerns or complaints at this time.  Objective: Vitals:   06/04/21 0058 06/04/21 0134 06/04/21 0530 06/04/21 0809  BP: (!) 143/78 (!) 147/98 (!) 142/92 (!) 137/94  Pulse: 76 (!) 108 92 86  Resp: 16 18 18 18   Temp:  98.6 F (37 C) 97.8 F (36.6 C) 98.3 F (36.8 C)  TempSrc:  Oral Oral Oral  SpO2: 98% 99% 99% 96%  Weight:      Height:        Intake/Output Summary (Last 24 hours) at 06/04/2021 0817 Last data filed at 06/04/2021 0600 Gross per 24 hour  Intake 2163.32 ml  Output 300 ml  Net 1863.32 ml   Filed Weights   06/03/21 1206  Weight: (!) 162.8 kg   Examination: Physical Exam:  Constitutional: WN/WD super  morbidly obese African-American male in no acute distress Eyes: Lids and conjunctivae normal, sclerae anicteric  ENMT: External Ears, Nose appear normal. Grossly normal hearing. Mucous membranes are moist.  Neck: Appears normal, supple, no cervical masses, normal ROM, no appreciable thyromegaly; no appreciable JVD Respiratory: Diminished to auscultation bilaterally with coarse breath sounds, no wheezing, rales, rhonchi or crackles. Normal respiratory effort and patient is not tachypenic. No accessory muscle use.  Unlabored breathing Cardiovascular: RRR, no murmurs / rubs / gallops. S1 and S2 auscultated.  Has mild 1+ nonpitting edema Abdomen: Soft, non-tender, distended secondary body habitus. Bowel sounds positive.  GU: Deferred. Musculoskeletal: No clubbing / cyanosis of digits/nails. No joint deformity upper and lower extremities but has chronic lower extremity venous stasis changes Skin: Right foot is swollen painful to palpate and has some erythema.  Has chronic lower extremity venous stasis changes and a little leathery dry skin.   Neurologic: CN 2-12 grossly intact with no focal deficits. Romberg sign and cerebellar reflexes not assessed.  Psychiatric: Normal judgment and insight. Alert and oriented x 3. Normal mood and appropriate affect.   Data Reviewed: I have personally reviewed following labs and imaging studies  CBC: Recent Labs  Lab 06/03/21 1209 06/04/21 0317  WBC 4.4 5.6  NEUTROABS 1.9  --   HGB 15.6 14.0  HCT 47.4 42.1  MCV 94.4 95.2  PLT 449* 402*   Basic Metabolic Panel: Recent Labs  Lab 06/03/21 1209 06/04/21 0317  NA 133* 135  K 4.3 4.5  CL 101 106  CO2 20* 25  GLUCOSE 121* 112*  BUN 17 18  CREATININE 1.45* 1.29*  CALCIUM 9.4 8.7*   GFR: Estimated Creatinine Clearance: 100.4 mL/min (A) (by C-G formula based on SCr of 1.29 mg/dL (H)). Liver Function Tests: Recent Labs  Lab 06/03/21 1209  AST 36  ALT 40  ALKPHOS 52  BILITOT 0.8  PROT 8.8*   ALBUMIN 3.7   No results for input(s): LIPASE, AMYLASE in the last 168 hours. No results for input(s): AMMONIA in the last 168 hours. Coagulation Profile: Recent Labs  Lab 06/03/21 1551  INR 1.9*   Cardiac Enzymes: No results for input(s): CKTOTAL, CKMB, CKMBINDEX, TROPONINI in the last 168 hours. BNP (last 3  results) No results for input(s): PROBNP in the last 8760 hours. HbA1C: No results for input(s): HGBA1C in the last 72 hours. CBG: No results for input(s): GLUCAP in the last 168 hours. Lipid Profile: No results for input(s): CHOL, HDL, LDLCALC, TRIG, CHOLHDL, LDLDIRECT in the last 72 hours. Thyroid Function Tests: No results for input(s): TSH, T4TOTAL, FREET4, T3FREE, THYROIDAB in the last 72 hours. Anemia Panel: No results for input(s): VITAMINB12, FOLATE, FERRITIN, TIBC, IRON, RETICCTPCT in the last 72 hours. Sepsis Labs: Recent Labs  Lab 06/03/21 1209 06/03/21 1410 06/03/21 1551  PROCALCITON  --   --  <0.10  LATICACIDVEN 2.5* 1.7  --     Recent Results (from the past 240 hour(s))  Culture, blood (routine x 2)     Status: None (Preliminary result)   Collection Time: 06/03/21  2:10 PM   Specimen: BLOOD  Result Value Ref Range Status   Specimen Description BLOOD RIGHT ANTECUBITAL  Final   Special Requests   Final    BOTTLES DRAWN AEROBIC AND ANAEROBIC Blood Culture adequate volume   Culture   Final    NO GROWTH < 24 HOURS Performed at [facility], [address], [place], [place] [address]    Report Status PENDING  Incomplete  Blood culture (routine x 2)     Status: None (Preliminary result)   Collection Time: 06/03/21  2:10 PM   Specimen: BLOOD  Result Value Ref Range Status   Specimen Description BLOOD BLOOD RIGHT FOREARM  Final   Special Requests   Final    BOTTLES DRAWN AEROBIC AND ANAEROBIC Blood Culture adequate volume   Culture   Final    NO GROWTH < 24 HOURS Performed at [facility], [address] [place], [place]  [address]    Report Status PENDING  Incomplete  Resp Panel by RT-PCR (Flu A&B, Covid) Nasopharyngeal Swab     Status: None   Collection Time: 06/03/21  3:51 PM   Specimen: Nasopharyngeal Swab; Nasopharyngeal(NP) swabs in vial transport medium  Result Value Ref Range Status   SARS Coronavirus 2 by RT PCR NEGATIVE NEGATIVE Final    Comment: (NOTE) SARS-CoV-2 target nucleic acids are NOT DETECTED.  The SARS-CoV-2 RNA is generally detectable in upper respiratory specimens during the acute phase of infection. The lowest concentration of SARS-CoV-2 viral copies this assay can detect is 138 copies/mL. A negative result does not preclude SARS-Cov-2 infection and should not be used as the sole basis for treatment or other patient management decisions. A negative result may occur with  improper specimen collection/handling, submission of specimen other than nasopharyngeal swab, presence of viral mutation(s) within the areas targeted by this assay, and inadequate number of viral copies(<138 copies/mL). A negative result must be combined with clinical observations, patient history, and epidemiological information. The expected result is Negative.  Fact Sheet for Patients:  BloggerCourse.com  Fact Sheet for Healthcare Providers:  SeriousBroker.it  This test is no t yet approved or cleared by the [place] FDA and  has been authorized for detection and/or diagnosis of SARS-CoV-2 by FDA under an Emergency Use Authorization (EUA). This EUA will remain  in effect (meaning this test can be used) for the duration of the COVID-19 declaration under Section 564(b)(1) of the Act, 21 U.S.C.section 360bbb-3(b)(1), unless the authorization is terminated  or revoked sooner.       Influenza A by PCR NEGATIVE NEGATIVE Final   Influenza B by PCR NEGATIVE NEGATIVE Final    Comment: (NOTE) The  Xpert Xpress SARS-CoV-2/FLU/RSV plus assay is intended as an  aid in the diagnosis of influenza from Nasopharyngeal swab specimens and should not be used as a sole basis for treatment. Nasal washings and aspirates are unacceptable for Xpert Xpress SARS-CoV-2/FLU/RSV testing.  Fact Sheet for Patients: BloggerCourse.com  Fact Sheet for Healthcare Providers: SeriousBroker.it  This test is not yet approved or cleared by the [place] FDA and has been authorized for detection and/or diagnosis of SARS-CoV-2 by FDA under an Emergency Use Authorization (EUA). This EUA will remain in effect (meaning this test can be used) for the duration of the COVID-19 declaration under Section 564(b)(1) of the Act, 21 U.S.C. section 360bbb-3(b)(1), unless the authorization is terminated or revoked.  Performed at [facility], [address] [place], [place] [address]     RN Pressure Injury Documentation:     Estimated body mass index is 56.21 kg/m as calculated from the following:   Height as of this encounter: 5\' 7"  (1.702 m).   Weight as of this encounter: 162.8 kg.  Malnutrition Type:  Malnutrition Characteristics:  Nutrition Interventions:    Radiology Studies: DG Foot 2 Views Right  Result Date: 06/03/2021 CLINICAL DATA:  Right foot pain. EXAM: RIGHT FOOT - 2 VIEW COMPARISON:  None. FINDINGS: There is no evidence of fracture or dislocation. There is no evidence of arthropathy or other focal bone abnormality. Soft tissues are unremarkable. IMPRESSION: Negative. Electronically Signed   By: 08/03/2021 M.D.   On: 06/03/2021 15:05    Scheduled Meds:  losartan  50 mg Oral Daily   rivaroxaban  20 mg Oral Daily   Continuous Infusions:  sodium chloride 75 mL/hr at 06/04/21 0145   cefTRIAXone (ROCEPHIN)  IV Stopped (06/03/21 1658)   vancomycin 1,500 mg (06/04/21 0529)    LOS: 1 day   08/04/21, DO [facility] PAGER is on AMION  If 7PM-7AM, please contact  night-coverage [email or web address]

## 2021-06-04 NOTE — Consult Note (Signed)
Pharmacy Antibiotic Note  Ryan [name] is a 50 y.o. male admitted on 06/03/2021 with right foot cellulitis. Pharmacy has been consulted for vancomycin dosing.  Plan: Pt received total Vancomycin 2500 mg IV loading dose followed by vancomycin 1500 mg BID. Will adjust vancomycin dose to 1000 mg BID based on AUC calculations. Predicted AUC is 431. Goal AUC of 400-550. Scr used 1.29 and Vd of 0.5. Plan to obtain vancomycin levels prior to the 4th dose.   Pt is also ordered ceftriaxone 2 g IV q24h    Height: 5\' 7"  (170.2 cm) Weight: (!) 162.8 kg (358 lb 14.5 oz) IBW/kg (Calculated) : 66.1  Temp (24hrs), Avg:98.2 F (36.8 C), Min:97.8 F (36.6 C), Max:98.6 F (37 C)  Recent Labs  Lab 06/03/21 1209 06/03/21 1410 06/04/21 0317  WBC 4.4  --  5.6  CREATININE 1.45*  --  1.29*  LATICACIDVEN 2.5* 1.7  --      Estimated Creatinine Clearance: 100.4 mL/min (A) (by C-G formula based on SCr of 1.29 mg/dL (H)).    Allergies  Allergen Reactions   Iodinated Diagnostic Agents Other (See Comments)    Kidney failure requiring dialysis Kidney failure requiring dialysis Kidney failure requiring dialysis    Metrizamide Other (See Comments)    Kidney failure requiring dialysis Kidney failure requiring dialysis    Clindamycin/Lincomycin Rash   Lincomycin Rash   Sulfa Antibiotics Rash    Antimicrobials this admission: 10/5 Zosyn x 1 10/5 vancomycin >>  10/5 ceftriaxone >>   Dose adjustments this admission: N/A  Microbiology results: 10/5 BCx: Pending  Thank you for allowing pharmacy to be a part of this patient's care.  08/04/21, PharmD 06/04/2021 3:15 PM

## 2021-06-04 NOTE — Progress Notes (Signed)
Mobility Specialist - Progress Note   06/04/21 1600  Mobility  Range of Motion/Exercises Right leg;Left leg  Level of Assistance Standby assist, set-up cues, supervision of patient - no hands on  Assistive Device None  Distance Ambulated (ft) 0 ft  Mobility Response Tolerated well  Mobility performed by Mobility specialist  $Mobility charge 1 Mobility    Pt lying in bed upon arrival utilizing RA. Denied pain at this time, but declined OOB activity d/t pain flaring with mobility. Participated in bed-level therex. Difficulty flex/ext R foot. Pt left in bed with needs in reach.    Filiberto Pinks Mobility Specialist 06/04/21, 4:55 PM

## 2021-06-05 DIAGNOSIS — I825Z3 Chronic embolism and thrombosis of unspecified deep veins of distal lower extremity, bilateral: Secondary | ICD-10-CM | POA: Diagnosis not present

## 2021-06-05 DIAGNOSIS — N189 Chronic kidney disease, unspecified: Secondary | ICD-10-CM

## 2021-06-05 DIAGNOSIS — N179 Acute kidney failure, unspecified: Secondary | ICD-10-CM

## 2021-06-05 DIAGNOSIS — N183 Chronic kidney disease, stage 3 unspecified: Secondary | ICD-10-CM | POA: Diagnosis not present

## 2021-06-05 DIAGNOSIS — L03115 Cellulitis of right lower limb: Secondary | ICD-10-CM | POA: Diagnosis not present

## 2021-06-05 DIAGNOSIS — I5032 Chronic diastolic (congestive) heart failure: Secondary | ICD-10-CM | POA: Diagnosis not present

## 2021-06-05 LAB — CBC WITH DIFFERENTIAL/PLATELET
Abs Immature Granulocytes: 0 10*3/uL (ref 0.00–0.07)
Basophils Absolute: 0 10*3/uL (ref 0.0–0.1)
Basophils Relative: 1 %
Eosinophils Absolute: 0.2 10*3/uL (ref 0.0–0.5)
Eosinophils Relative: 4 %
HCT: 40.2 % (ref 39.0–52.0)
Hemoglobin: 13.2 g/dL (ref 13.0–17.0)
Immature Granulocytes: 0 %
Lymphocytes Relative: 37 %
Lymphs Abs: 1.4 10*3/uL (ref 0.7–4.0)
MCH: 30.8 pg (ref 26.0–34.0)
MCHC: 32.8 g/dL (ref 30.0–36.0)
MCV: 93.9 fL (ref 80.0–100.0)
Monocytes Absolute: 0.5 10*3/uL (ref 0.1–1.0)
Monocytes Relative: 14 %
Neutro Abs: 1.6 10*3/uL — ABNORMAL LOW (ref 1.7–7.7)
Neutrophils Relative %: 44 %
Platelets: 347 10*3/uL (ref 150–400)
RBC: 4.28 MIL/uL (ref 4.22–5.81)
RDW: 14.5 % (ref 11.5–15.5)
WBC: 3.7 10*3/uL — ABNORMAL LOW (ref 4.0–10.5)
nRBC: 0 % (ref 0.0–0.2)

## 2021-06-05 LAB — PHOSPHORUS: Phosphorus: 3.7 mg/dL (ref 2.5–4.6)

## 2021-06-05 LAB — COMPREHENSIVE METABOLIC PANEL
ALT: 41 U/L (ref 0–44)
AST: 32 U/L (ref 15–41)
Albumin: 3.3 g/dL — ABNORMAL LOW (ref 3.5–5.0)
Alkaline Phosphatase: 49 U/L (ref 38–126)
Anion gap: 7 (ref 5–15)
BUN: 17 mg/dL (ref 6–20)
CO2: 22 mmol/L (ref 22–32)
Calcium: 8.7 mg/dL — ABNORMAL LOW (ref 8.9–10.3)
Chloride: 105 mmol/L (ref 98–111)
Creatinine, Ser: 1.12 mg/dL (ref 0.61–1.24)
GFR, Estimated: >60 mL/min (ref >60)
Glucose, Bld: 108 mg/dL — ABNORMAL HIGH (ref 70–99)
Potassium: 4.2 mmol/L (ref 3.5–5.1)
Sodium: 134 mmol/L — ABNORMAL LOW (ref 135–145)
Total Bilirubin: 0.9 mg/dL (ref 0.3–1.2)
Total Protein: 7.6 g/dL (ref 6.5–8.1)

## 2021-06-05 LAB — MAGNESIUM: Magnesium: 1.7 mg/dL (ref 1.7–2.4)

## 2021-06-05 LAB — HEMOGLOBIN A1C
Hgb A1c MFr Bld: 6.6 % — ABNORMAL HIGH (ref 4.8–5.6)
Mean Plasma Glucose: 143 mg/dL

## 2021-06-05 MED ORDER — MAGNESIUM SULFATE 2 GM/50ML IV SOLN
2.0000 g | Freq: Once | INTRAVENOUS | Status: AC
  Administered 2021-06-05: 2 g via INTRAVENOUS
  Filled 2021-06-05: qty 50

## 2021-06-05 MED ORDER — SODIUM CHLORIDE 0.9 % IV SOLN
INTRAVENOUS | Status: DC | PRN
  Administered 2021-06-05: 250 mL via INTRAVENOUS

## 2021-06-05 NOTE — TOC Initial Note (Addendum)
Transition of Care [facility]) - Initial/Assessment Note    Patient Details  Name: Ryan [name] MRN: [id number] Date of Birth: April 18, 1971  Transition of [facility]) CM/SW Contact:    Candie Chroman, LCSW Phone Number: 06/05/2021, 1:48 PM  Clinical Narrative:  CSW met with patient. No supports at bedside. CSW introduced role and explained that PT recommendations would be discussed. Patient declining home health and outpatient therapy at this time. Told him to notify CSW if he changes his mind before discharge. Did make him aware that it is often difficult to find home health for patients with commercial insurance. Gave him list for local outpatient therapy clinics in case he wants to pursue this after discharge. Patient uses a rolling walker at home and says he thinks he has a cane somewhere in the house. No further concerns. CSW encouraged patient to contact CSW as needed. CSW will continue to follow patient for support and facilitate return home when stable.         2:26 pm: OT recommending 3-in-1. Patient declined. There is already a seat in his shower. His bathroom is in his bedroom so there is only 2-3 steps to get in there which he feels like he can manage.     Expected Discharge Plan: [place] Barriers to Discharge: Continued Medical Work up   Patient Goals and CMS Choice        Expected Discharge Plan and Services Expected Discharge Plan: Advance Acute Care Choice: NA Living arrangements for the past 2 months: Single Family Home                                      Prior Living Arrangements/Services Living arrangements for the past 2 months: Single Family Home Lives with:: Spouse Patient language and need for interpreter reviewed:: Yes Do you feel safe going back to the place where you live?: Yes      Need for Family Participation in Patient Care: Yes (Comment) Care giver support system in place?: Yes (comment) Current  home services: DME Criminal Activity/Legal Involvement Pertinent to Current Situation/Hospitalization: No - Comment as needed  Activities of Daily Living Home Assistive Devices/Equipment: Eyeglasses ADL Screening (condition at time of admission) Patient's cognitive ability adequate to safely complete daily activities?: Yes Is the patient deaf or have difficulty hearing?: No Does the patient have difficulty seeing, even when wearing glasses/contacts?: No Does the patient have difficulty concentrating, remembering, or making decisions?: No Patient able to express need for assistance with ADLs?: Yes Does the patient have difficulty dressing or bathing?: No Independently performs ADLs?: No Communication: Independent Dressing (OT): Independent Grooming: Independent Feeding: Independent Bathing: Appropriate for developmental age 65: Independent with device (comment) In/Out Bed: Needs assistance Is this a change from baseline?: Change from baseline, expected to last <3 days Walks in Home: Needs assistance Is this a change from baseline?: Change from baseline, expected to last <3 days Does the patient have difficulty walking or climbing stairs?: Yes Weakness of Legs: Right Weakness of Arms/Hands: None  Permission Sought/Granted                  Emotional Assessment Appearance:: Appears stated age Attitude/Demeanor/Rapport: Engaged, Gracious Affect (typically observed): Appropriate, Calm, Pleasant Orientation: : Oriented to Self, Oriented to Place, Oriented to  Time, Oriented to Situation Alcohol / Substance Use: Not Applicable  Psych Involvement: No (comment)  Admission diagnosis:  Cellulitis [L03.90] Cellulitis of right foot [L03.115] Patient Active Problem List   Diagnosis Date Noted   Cellulitis of right foot 06/03/2021   Knee pain 03/11/2020   Weakness of both lower limbs 11/17/2017   Diabetes ([place]) 08/03/2017   CAP (community acquired pneumonia) 08/03/2017   CKD  (chronic kidney disease), stage IIIa 08/03/2017   Pulmonary embolus (HCC) 01/03/2017   SOB (shortness of breath) 12/20/2016   Chronic diastolic CHF (congestive heart failure) ([place]) 12/15/2016   HTN (hypertension) 12/15/2016   Fatigue 12/15/2016   PVC (premature ventricular contraction) 11/24/2016   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   Obesity, Class III, BMI 40-49.9 (morbid obesity) ([place]) 11/24/2016   Pulmonary emboli ([place]) 11/22/2016   DVT (deep venous thrombosis) ([place]) 12/28/2014   Hepatic steatosis 05/30/2013   Morbid obesity with BMI of 50.0-59.9, adult ([place]) 05/30/2013   Proteinuria 05/30/2013   PCP:  Casilda Carls, MD Pharmacy:   [facility] [phone] - [address], [address] [place] [address] Phone: [phone] Fax: [phone]     Social Determinants of Health (SDOH) Interventions    Readmission Risk Interventions No flowsheet data found.

## 2021-06-05 NOTE — Evaluation (Signed)
Physical Therapy Evaluation Patient Details Name: Ryan [name] MRN: [id number] DOB: Oct 23, 1970 Today's Date: 06/05/2021  History of Present Illness  50 y.o. male presenting to ED with R foot pain and persistent infection for 3 weeks. Medical history significant of HTN, pre-DM, DVT/PE on Xarelto, dCHF,, PVD, CKD stage IIIa, atrial fibrillation (s/p of ablation per pt, triggered by COVID infection).   Clinical Impression  Pt received supine in bed, agreeable to therapy. He does report walking into the bathroom last night with 10/10 pain and requiring 1.5 hours to recover from increased pain. At rest, RLE pain is currently a 5/10. Pt demo strength WFL in all joints except R ankle in which active and passive ROM are significantly limited 2/2 swelling and strength was not tested. Pt performed bed mobility Mod I; STS and standing mobility with SUP and increase in pain. Ambulation deferred this visit to avoid further pain increase. Pt remains on PT list to assess ambulation once pain is controlled. Would benefit from skilled PT to address above deficits and promote optimal return to PLOF.      Recommendations for follow up therapy are one component of a multi-disciplinary discharge planning process, led by the attending physician.  Recommendations may be updated based on patient status, additional functional criteria and insurance authorization.  Follow Up Recommendations Home health PT;Supervision for mobility/OOB    Equipment Recommendations  None recommended by PT    Recommendations for Other Services       Precautions / Restrictions Precautions Precautions: Fall Restrictions Weight Bearing Restrictions: No      Mobility  Bed Mobility Overal bed mobility: Modified Independent             General bed mobility comments: used bed rails    Transfers Overall transfer level: Needs assistance Equipment used: None Transfers: Sit to/from Stand Sit to Stand: Supervision          General transfer comment: SUP for safety  Ambulation/Gait             General Gait Details: deferred - pt reporting significant pain with RLE WB and 10/10 pain with previous attempt at [employer] Mobility    Modified Rankin (Stroke Patients Only)       Balance Overall balance assessment: Needs assistance Sitting-balance support: No upper extremity supported;Feet supported Sitting balance-Leahy Scale: Fair Sitting balance - Comments: donning shoes EOB, limited in forward trunk flexion, minimal assistance with donning shoes   Standing balance support: No upper extremity supported Standing balance-Leahy Scale: Fair Standing balance comment: Standing with leftward weight shift to decreased WB through RLE. Good stability with eyes closed and moderate perturbations. Dynamic balance not attempted.                             Pertinent Vitals/Pain Pain Assessment: 0-10 Pain Score: 5  Pain Location: R foot Pain Descriptors / Indicators: Constant Pain Intervention(s): Limited activity within patient's tolerance;Monitored during session;Repositioned    Home Living Family/patient expects to be discharged to:: Private residence Living Arrangements: Spouse/significant other;Children Available Help at Discharge: Family;Available PRN/intermittently Type of Home: House Home Access: Level entry     Home Layout: Two level Home Equipment: Walker - 2 wheels;Cane - single point Additional Comments: has been using RW the last 3 weeks due to foot pain and infection - does not typically use AD at baseline    Prior Function  Level of Independence: Independent         Comments: Works full-time from home for [employer]     Higher education careers adviser        Extremity/Trunk Assessment   Upper Extremity Assessment Upper Extremity Assessment: Overall WFL for tasks assessed    Lower Extremity Assessment Lower Extremity Assessment: Overall WFL  for tasks assessed;RLE deficits/detail RLE Deficits / Details: Decreased active and passive ROM to R ankle; edema and pain noted       Communication   Communication: No difficulties  Cognition Arousal/Alertness: Awake/alert Behavior During Therapy: WFL for tasks assessed/performed Overall Cognitive Status: Within Functional Limits for tasks assessed                                 General Comments: A&Ox4      General Comments General comments (skin integrity, edema, etc.): Edema in RLE    Exercises Other Exercises Other Exercises: PT elevated RLE on 3 pillows. Pt asked about how to stay active while in bed - PT delivered RB for pt to use with BUE and LLE - PT did advise to refrain from using on RLE until increased active movement returns.   Assessment/Plan    PT Assessment Patient needs continued PT services  PT Problem List Decreased mobility;Decreased range of motion;Decreased activity tolerance;Decreased balance       PT Treatment Interventions DME instruction;Therapeutic activities;Gait training;Therapeutic exercise;Stair training;Balance training;Functional mobility training;Neuromuscular re-education;Patient/family education    PT Goals (Current goals can be found in the Care Plan section)  Acute Rehab PT Goals Patient Stated Goal: to go home PT Goal Formulation: With patient Time For Goal Achievement: 06/19/21 Potential to Achieve Goals: Good    Frequency Min 2X/week   Barriers to discharge        Co-evaluation               AM-PAC PT "6 Clicks" Mobility  Outcome Measure Help needed turning from your back to your side while in a flat bed without using bedrails?: A Little Help needed moving from lying on your back to sitting on the side of a flat bed without using bedrails?: A Little Help needed moving to and from a bed to a chair (including a wheelchair)?: A Little Help needed standing up from a chair using your arms (e.g., wheelchair or  bedside chair)?: A Little Help needed to walk in hospital room?: A Little Help needed climbing 3-5 steps with a railing? : A Lot 6 Click Score: 17    End of Session   Activity Tolerance: Patient tolerated treatment well;Patient limited by pain Patient left: in bed;with call bell/phone within reach Nurse Communication: Mobility status PT Visit Diagnosis: Other abnormalities of gait and mobility (R26.89);Difficulty in walking, not elsewhere classified (R26.2);Unsteadiness on feet (R26.81)    Time: 1133-1150 PT Time Calculation (min) (ACUTE ONLY): 17 min   Charges:   PT Evaluation $PT Eval Low Complexity: 1 Low PT Treatments $Neuromuscular Re-education: 8-22 mins        Ryan [name] PT, DPT 06/05/21 12:28 PM [phone]   Ryan [name] 06/05/2021, 12:22 PM

## 2021-06-05 NOTE — Evaluation (Signed)
Occupational Therapy Evaluation Patient Details Name: Ryan [name] MRN: [id number] DOB: [birth year] Today's Date: 06/05/2021   History of Present Illness 50 y.o. male presenting to ED with R foot pain and persistent infection for 3 weeks. Medical history significant of HTN, pre-DM, DVT/PE on Xarelto, dCHF,, PVD, CKD stage IIIa, atrial fibrillation (s/p of ablation per pt, triggered by COVID infection).   Clinical Impression   Pt seen for OT evaluation this date in setting of acute hospitalization d/t R foot cellulitis. Pt reports being INDEP at baseline including working. 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 Pt reports that he was unable to walk upstairs to work for at least a week d/t R foot pain. Pt presents this date with decreased standing tolerance, R LE pain, and generally decreased strength superimposed on increased body habitus, impacting his ability to safely and efficiently perform ADLs/ADL mobility. Pt able to come to sitting with MOD I with HOB elevated. Requires CGA/SUPV to come to standing from slightly elevated EOB. Pt benefits from UE Support for weight shifting, but does not require AD for static standing. Pt requires MIN A for seated LB ADLs (reports use of AE for LB dressing at baseline including sock aide). Pt returned to bed with all needs met and in reach, R LE elevated. Will continue to follow acutely and anticipate pt could benefit from [facility] to increase INDEP with I/ADLs.      Recommendations for follow up therapy are one component of a multi-disciplinary discharge planning process, led by the attending physician.  Recommendations may be updated based on patient status, additional functional criteria and insurance authorization.   Follow Up Recommendations  Home health OT    Equipment Recommendations  3 in 1 bedside commode;Other (comment) (bariatric 2WW)    Recommendations for Other Services       Precautions /  Restrictions Precautions Precautions: Fall Restrictions Weight Bearing Restrictions: No      Mobility Bed Mobility Overal bed mobility: Modified Independent             General bed mobility comments: used bed rails    Transfers Overall transfer level: Needs assistance Equipment used: None Transfers: Sit to/from Stand Sit to Stand: Min guard;Supervision         General transfer comment: CGA with progress to SUPV    Balance Overall balance assessment: Mild deficits observed, not formally tested Sitting-balance support: No upper extremity supported;Feet supported Sitting balance-Leahy Scale: Good Sitting balance - Comments: G static, F dynamic (to weight shift with donning shoes)   Standing balance support: No upper extremity supported Standing balance-Leahy Scale: Fair Standing balance comment: does not require UE support to static stand, benefits from UE support to weight shift.                           ADL either performed or assessed with clinical judgement   ADL Overall ADL's : Needs assistance/impaired                                       General ADL Comments: INDEP for UB ADLs, MIN A for LB ADLs (uses AE at baseline) CGA for ADL transfers, benefits from UE Support on RW to shift weight off R LE.     Vision Patient Visual Report: No change from baseline  Perception     Praxis      Pertinent Vitals/Pain Pain Assessment: 0-10 Pain Score: 5  Pain Location: R foot 5/10 resting, increase to 6/10 sitting, 8/10 standing Pain Descriptors / Indicators: Tightness;Sore Pain Intervention(s): Limited activity within patient's tolerance;Monitored during session;Repositioned     Hand Dominance     Extremity/Trunk Assessment Upper Extremity Assessment Upper Extremity Assessment: Overall WFL for tasks assessed   Lower Extremity Assessment Lower Extremity Assessment: Overall WFL for tasks assessed RLE Deficits / Details:  increased edema, decreased DF/PF and rotation of R ankle       Communication Communication Communication: No difficulties   Cognition Arousal/Alertness: Awake/alert Behavior During Therapy: WFL for tasks assessed/performed Overall Cognitive Status: Within Functional Limits for tasks assessed                                 General Comments: A&Ox4   General Comments  Edema in RLE    Exercises Other Exercises Other Exercises: OT ed re: role of OT in acute setting. OT repositions R LE elevated to reduce edema and educates re: bed level tasks to improve ciculation and subsequently healing.   Shoulder Instructions      Home Living Family/patient expects to be discharged to:: Private residence Living Arrangements: Spouse/significant other;Children Available Help at Discharge: Family;Available PRN/intermittently Type of Home: House Home Access: Level entry     Home Layout: Two level Alternate Level Stairs-Number of Steps: Bed/bathroom on main level; 12 steps to office (pt works from home) Alternate Level Stairs-Rails: Left           Home Equipment: Environmental consultant - 2 wheels;Cane - single point;Adaptive equipment Adaptive Equipment: Sock aid;Long-handled shoe horn (uses Qday at baseline for dressing) Additional Comments: has been using RW the last 3 weeks due to foot pain and infection - does not typically use AD at baseline      Prior Functioning/Environment Level of Independence: Independent        Comments: Works full-time from home for [employer]        OT Problem List: Decreased strength;Decreased activity tolerance;Pain;Increased edema;Obesity      OT Treatment/Interventions: Self-care/ADL training;Therapeutic exercise;DME and/or AE instruction    OT Goals(Current goals can be found in the care plan section) Acute Rehab OT Goals Patient Stated Goal: to go home OT Goal Formulation: With patient Time For Goal Achievement: 06/19/21 Potential to  Achieve Goals: Good ADL Goals Pt Will Transfer to Toilet: with modified independence;ambulating Pt Will Perform Toileting - Clothing Manipulation and hygiene: with modified independence;sit to/from stand  OT Frequency: Min 1X/week   Barriers to D/C:            Co-evaluation              AM-PAC OT "6 Clicks" Daily Activity     Outcome Measure Help from another person eating meals?: None Help from another person taking care of personal grooming?: None Help from another person toileting, which includes using toliet, bedpan, or urinal?: A Little Help from another person bathing (including washing, rinsing, drying)?: A Little Help from another person to put on and taking off regular upper body clothing?: None Help from another person to put on and taking off regular lower body clothing?: A Little 6 Click Score: 21   End of Session Equipment Utilized During Treatment: Rolling walker Nurse Communication: Mobility status  Activity Tolerance: Patient tolerated treatment well Patient left: in bed;with call bell/phone  within reach  OT Visit Diagnosis: Other abnormalities of gait and mobility (R26.89);Pain Pain - Right/Left: Right Pain - part of body: Ankle and joints of foot                Time: 1022-1047 OT Time Calculation (min): 25 min Charges:  OT General Charges $OT Visit: 1 Visit OT Evaluation $OT Eval Low Complexity: 1 Low OT Treatments $Self Care/Home Management : 8-22 mins  Gerrianne Scale, MS, OTR/L ascom [phone] 06/05/21, 2:05 PM

## 2021-06-05 NOTE — Progress Notes (Signed)
PROGRESS NOTE    Ryan [name]  KGM:[id number] DOB: 12/28/1970 DOA: 06/03/2021 PCP: Sherrie Mustache, MD   Brief Narrative:  The patient is a 50 year old super morbidly obese African-American male with a past medical history significant for but not limited to hypertension, prediabetes mellitus, history of DVT and PE on Xarelto, history of diastolic CHF that is chronic, PVD, CKD stage IIIa, history of atrial fibrillation status post ablation as well as other comorbidities who presented with right foot pain.  He states that he has had right foot pain due to infection for more than 3 weeks.  He was seen in [facility] ED on 05/26/2019 was started on Keflex without improvement.  At that time he had a venous duplex done which showed no DVT per report.  He states that his right foot has been progressively worsening and has become more erythematous, swollen and painful.  The pain was constant and severe described as sharp nonradiating.  He states that he initially had some fevers and chills which have resolved.  He has not had no symptoms of chest pain, shortness of breath or cough or nausea or vomiting and no symptoms of UTI.  He is currently taking anticoagulation with Xarelto daily.  He presented to the ED was found to have a WBC of 4.4.  X-ray was done and did not show any evidence of osteomyelitis and he was admitted to MedSurg for acute cellulitis failing outpatient antibiotics.  He is currently on IV vancomycin and ceftriaxone improving but still has some foot pain but not as bad today.  We will continue antibiotics for another day to the IV in assess his progress.  PT OT have been consulted for further evaluation and still pending to be done given the patient is continuing to have some foot pain  Assessment & Plan:   Principal Problem:   Cellulitis of right foot Active Problems:   DVT (deep venous thrombosis) (HCC)   Chronic diastolic CHF (congestive heart failure) (HCC)   HTN (hypertension)   Pulmonary  embolus (HCC)   CKD (chronic kidney disease), stage IIIa  Right Foot Cellulitis with associated right foot pain, improving slowly -Failed outpatient Treatment with po Keflex and he was seen at [facility] ED on 05/25/2021 and was started on p.o. Keflex without improvement -Patient states the pain in his foot has progressively gotten worse and right foot was erythematous, painful and swollen -Lactic acid was elevated on admission at 2.5 then trended down to 1.7 -CRP was 0.7 and procalcitonin was less than 0.10 -Blood cultures x2 shows no growth to date at 2 days -DG of the right foot showed "There is no evidence of fracture or dislocation. There is no evidence of arthropathy or other focal bone abnormality. Soft tissues are unremarkable." -May consider MRI if not improving further or additional studies with a CT scan in a.m. if he continues to be slow to -Recently had a venous duplex at [facility] about a week ago which she self-reports is negative -IV fluid hydration with normal saline at 75 MLS per hour has now been stopped -Continue with empiric antibiotics with IV vancomycin and IV ceftriaxone and de-escalate accordingly -Continue with pain control with oxycodone-acetaminophen 1 tab p.o. every 4 as needed for moderate pain as well as IV morphine 2 mg every 4 as needed severe pain -Patient continues to have foot pain so we will add diclofenac 2 g topically 4 times daily; diclofenac has helped the topical pain and they are able to actually palpate the  patient's foot today.   History of A. fib status post ablation -Triggered by COVID infection previously -If necessary will place on telemetry but remained in sinus rhythm -He is no longer taking diltiazem 120 mg every 24 hours or Multaq 400 g p.o. twice daily  Hx of Chronic PE and bilateral DVT -Anticoagulated with Xarelto  CKD Stabge 3a with history of prior renal failure -Patient's BUN/Cr went from 17/1.45 -> 18/1.29 and is further improved to  17/1.12 -Avoid nephrotoxic medications, contrast dyes, hypotension and renally adjust medications -Continue to monitor and trend and repeat CMP in a.m.  HTN -Continue with losartan 50 mg p.o. daily and if necessary patient has IV hydralazine 5 mg every 2 as needed for systolic blood pressure greater than 165 -We will monitor blood pressures per protocol -We will hold his chlorthalidone 25 mg p.o. daily; no longer taking metoprolol or hydrochlorothiazide -Last blood pressure reading was 116/82  Peripheral vascular disease -On Rivaroxaban 20 mg p.o. daily does not appear to be on a statin or ASA  -Sees vascular surgery Dr. Wyn Quaker in outpatient setting and will need to follow-up with him within 1 to 2 weeks  Prediabetes -Blood sugar on admission was 121 and repeat this morning was 112 -Continue monitor blood sugars carefully and if necessary will place on sensitive NovoLog/scale insulin -Last documented hemoglobin A1c in our system was 6.4 back in 2018 we will repeat in the morning  Chronic Diastolic CHF -BNP was 8.1 but not of much value given the patient is super morbidly obese -Strict I's and O's and daily weights is that he is getting IV fluid hydration -Continue monitor for signs and symptoms of volume overload; patient is +2.425 L since admission -Currently not decompensated   Thrombocytosis -Likely reactive in the setting of infection as above -Platelet count from 449 and trended down to 402 and is further improved to 347 and resolved  -Continue monitor and trend and repeat CBC in a.m.  Super Morbid Obesity -Complicates overall prognosis and care -Estimated body mass index is 56.59 kg/m as calculated from the following:   Height as of this encounter: 5\' 7"  (1.702 m).   Weight as of this encounter: 163.9 kg. -Weight Loss and Dietary Counseling give    DVT prophylaxis: Anticoagulated with Xarelto Code Status: FULL CODE Family Communication: No family currently at  bedside Disposition Plan: Pending further clinical improvement anticipating discharge in next 24 to 48 hours if Cellulitis is improving and PT/OT Evaluate and make Recommendations   Status is: Inpatient  Remains inpatient appropriate because:Unsafe d/c plan, IV treatments appropriate due to intensity of illness or inability to take PO, and Inpatient level of care appropriate due to severity of illness  Dispo: The patient is from: Home              Anticipated d/c is to: Home              Patient currently is not medically stable to d/c.   Difficult to place patient No  Consultants:  None  Procedures: None  Antimicrobials:  Anti-infectives (From admission, onward)    Start     Dose/Rate Route Frequency Ordered Stop   06/04/21 2000  vancomycin (VANCOCIN) IVPB 1000 mg/200 mL premix        1,000 mg 200 mL/hr over 60 Minutes Intravenous Every 12 hours 06/04/21 1515     06/04/21 0400  vancomycin (VANCOREADY) IVPB 1500 mg/300 mL  Status:  Discontinued        1,500  mg 150 mL/hr over 120 Minutes Intravenous Every 12 hours 06/03/21 1556 06/04/21 1511   06/03/21 1600  cefTRIAXone (ROCEPHIN) 2 g in sodium chloride 0.9 % 100 mL IVPB        2 g 200 mL/hr over 30 Minutes Intravenous Every 24 hours 06/03/21 1517     06/03/21 1600  vancomycin (VANCOREADY) IVPB 1500 mg/300 mL        1,500 mg 150 mL/hr over 120 Minutes Intravenous  Once 06/03/21 1548 06/03/21 1939   06/03/21 1400  vancomycin (VANCOCIN) IVPB 1000 mg/200 mL premix        1,000 mg 200 mL/hr over 60 Minutes Intravenous  Once 06/03/21 1351 06/03/21 1551   06/03/21 1400  piperacillin-tazobactam (ZOSYN) IVPB 3.375 g        3.375 g 100 mL/hr over 30 Minutes Intravenous  Once 06/03/21 1351 06/03/21 1440        Subjective: Seen and examined at bedside and thinks he is doing much better and his foot is not as painful.  Still swollen but thinks he is improving.  Does not think it is as bad and does not think it is a swollen.  Has  difficulty ambulating and putting pressure and weight on it.  States that he ambulated to the bathroom yesterday and after his foot significantly hurt for about 2 hours.  Happy that his renal function is improved.  No other concerns or complaints at this time.  Objective: Vitals:   06/04/21 0809 06/04/21 1559 06/04/21 1951 06/05/21 0452  BP: (!) 137/94 (!) 146/89 128/75 116/82  Pulse: 86 87 93 80  Resp: 18 16 20 20   Temp: 98.3 F (36.8 C) 98.5 F (36.9 C) 98.3 F (36.8 C) 97.9 F (36.6 C)  TempSrc: Oral Oral Oral Oral  SpO2: 96% 96% 96% 97%  Weight:    (!) 163.9 kg  Height:        Intake/Output Summary (Last 24 hours) at 06/05/2021 0953 Last data filed at 06/05/2021 0344 Gross per 24 hour  Intake 1262.59 ml  Output 700 ml  Net 562.59 ml    Filed Weights   06/03/21 1206 06/05/21 0452  Weight: (!) 162.8 kg (!) 163.9 kg   Examination: Physical Exam:  Constitutional: WN/WD super morbidly obese African-American male currently in no acute distress  Eyes: Lids and conjunctivae normal, sclerae anicteric  ENMT: External Ears, Nose appear normal. Grossly normal hearing. Mucous membranes are moist. Neck: Appears normal, supple, no cervical masses, normal ROM, no appreciable thyromegaly; no appreciable JVD Respiratory: Slight diminished to auscultation bilaterally with coarse breath sounds, no wheezing, rales, rhonchi or crackles. Normal respiratory effort and patient is not tachypenic. No accessory muscle use.  Unlabored breathing and not wearing supplemental oxygen nasal cannula Cardiovascular: RRR, no murmurs / rubs / gallops. S1 and S2 auscultated.  Has mild 1+ nonpitting edema in the leg and has slight pitting pedal edema in the right foot Abdomen: Soft, non-tender, distended secondary to body habitus. Bowel sounds positive.  GU: Deferred. Musculoskeletal: No clubbing / cyanosis of digits/nails. No joint deformity upper and lower extremities but has chronic lower extremity venous  stasis changes Skin: Right foot remains swollen and slightly painful to palpate and erythema is still there but is improving.  He has some venous stasis changes bilaterally in the lower extremities and some slight leathery dry skin Neurologic: CN 2-12 grossly intact with no focal deficits. Romberg sign and cerebellar reflexes not assessed.  Psychiatric: Normal judgment and insight. Alert and oriented x  3. Normal mood and appropriate affect.   Data Reviewed: I have personally reviewed following labs and imaging studies  CBC: Recent Labs  Lab 06/03/21 1209 06/04/21 0317 06/05/21 0503  WBC 4.4 5.6 3.7*  NEUTROABS 1.9  --  1.6*  HGB 15.6 14.0 13.2  HCT 47.4 42.1 40.2  MCV 94.4 95.2 93.9  PLT 449* 402* 347    Basic Metabolic Panel: Recent Labs  Lab 06/03/21 1209 06/04/21 0317 06/05/21 0503  NA 133* 135 134*  K 4.3 4.5 4.2  CL 101 106 105  CO2 20* 25 22  GLUCOSE 121* 112* 108*  BUN 17 18 17   CREATININE 1.45* 1.29* 1.12  CALCIUM 9.4 8.7* 8.7*  MG  --   --  1.7  PHOS  --   --  3.7    GFR: Estimated Creatinine Clearance: 116.1 mL/min (by C-G formula based on SCr of 1.12 mg/dL). Liver Function Tests: Recent Labs  Lab 06/03/21 1209 06/05/21 0503  AST 36 32  ALT 40 41  ALKPHOS 52 49  BILITOT 0.8 0.9  PROT 8.8* 7.6  ALBUMIN 3.7 3.3*    No results for input(s): LIPASE, AMYLASE in the last 168 hours. No results for input(s): AMMONIA in the last 168 hours. Coagulation Profile: Recent Labs  Lab 06/03/21 1551  INR 1.9*    Cardiac Enzymes: No results for input(s): CKTOTAL, CKMB, CKMBINDEX, TROPONINI in the last 168 hours. BNP (last 3 results) No results for input(s): PROBNP in the last 8760 hours. HbA1C: No results for input(s): HGBA1C in the last 72 hours. CBG: No results for input(s): GLUCAP in the last 168 hours. Lipid Profile: No results for input(s): CHOL, HDL, LDLCALC, TRIG, CHOLHDL, LDLDIRECT in the last 72 hours. Thyroid Function Tests: No results for  input(s): TSH, T4TOTAL, FREET4, T3FREE, THYROIDAB in the last 72 hours. Anemia Panel: No results for input(s): VITAMINB12, FOLATE, FERRITIN, TIBC, IRON, RETICCTPCT in the last 72 hours. Sepsis Labs: Recent Labs  Lab 06/03/21 1209 06/03/21 1410 06/03/21 1551  PROCALCITON  --   --  <0.10  LATICACIDVEN 2.5* 1.7  --      Recent Results (from the past 240 hour(s))  Culture, blood (routine x 2)     Status: None (Preliminary result)   Collection Time: 06/03/21  2:10 PM   Specimen: BLOOD  Result Value Ref Range Status   Specimen Description BLOOD RIGHT ANTECUBITAL  Final   Special Requests   Final    BOTTLES DRAWN AEROBIC AND ANAEROBIC Blood Culture adequate volume   Culture   Final    NO GROWTH 2 DAYS Performed at [facility], [address] [place], [place]    Report Status PENDING  Incomplete  Blood culture (routine x 2)     Status: None (Preliminary result)   Collection Time: 06/03/21  2:10 PM   Specimen: BLOOD  Result Value Ref Range Status   Specimen Description BLOOD BLOOD RIGHT FOREARM  Final   Special Requests   Final    BOTTLES DRAWN AEROBIC AND ANAEROBIC Blood Culture adequate volume   Culture   Final    NO GROWTH 2 DAYS Performed at [facility], [address], [place], [place]    Report Status PENDING  Incomplete  Resp Panel by RT-PCR (Flu A&B, Covid) Nasopharyngeal Swab     Status: None   Collection Time: 06/03/21  3:51 PM   Specimen: Nasopharyngeal Swab; Nasopharyngeal(NP) swabs in vial transport medium  Result Value Ref Range Status  SARS Coronavirus 2 by RT PCR NEGATIVE NEGATIVE Final    Comment: (NOTE) SARS-CoV-2 target nucleic acids are NOT DETECTED.  The SARS-CoV-2 RNA is generally detectable in upper respiratory specimens during the acute phase of infection. The lowest concentration of SARS-CoV-2 viral copies this assay can detect is 138 copies/mL. A negative result does not preclude SARS-Cov-2 infection and  should not be used as the sole basis for treatment or other patient management decisions. A negative result may occur with  improper specimen collection/handling, submission of specimen other than nasopharyngeal swab, presence of viral mutation(s) within the areas targeted by this assay, and inadequate number of viral copies(<138 copies/mL). A negative result must be combined with clinical observations, patient history, and epidemiological information. The expected result is Negative.  Fact Sheet for Patients:  BloggerCourse.com  Fact Sheet for Healthcare Providers:  SeriousBroker.it  This test is no t yet approved or cleared by the [place] FDA and  has been authorized for detection and/or diagnosis of SARS-CoV-2 by FDA under an Emergency Use Authorization (EUA). This EUA will remain  in effect (meaning this test can be used) for the duration of the COVID-19 declaration under Section 564(b)(1) of the Act, 21 U.S.C.section 360bbb-3(b)(1), unless the authorization is terminated  or revoked sooner.       Influenza A by PCR NEGATIVE NEGATIVE Final   Influenza B by PCR NEGATIVE NEGATIVE Final    Comment: (NOTE) The Xpert Xpress SARS-CoV-2/FLU/RSV plus assay is intended as an aid in the diagnosis of influenza from Nasopharyngeal swab specimens and should not be used as a sole basis for treatment. Nasal washings and aspirates are unacceptable for Xpert Xpress SARS-CoV-2/FLU/RSV testing.  Fact Sheet for Patients: BloggerCourse.com  Fact Sheet for Healthcare Providers: SeriousBroker.it  This test is not yet approved or cleared by the [place] FDA and has been authorized for detection and/or diagnosis of SARS-CoV-2 by FDA under an Emergency Use Authorization (EUA). This EUA will remain in effect (meaning this test can be used) for the duration of the COVID-19 declaration  under Section 564(b)(1) of the Act, 21 U.S.C. section 360bbb-3(b)(1), unless the authorization is terminated or revoked.  Performed at [facility], [address] [place], [place] [address]      RN Pressure Injury Documentation:     Estimated body mass index is 56.59 kg/m as calculated from the following:   Height as of this encounter: 5\' 7"  (1.702 m).   Weight as of this encounter: 163.9 kg.  Malnutrition Type:  Malnutrition Characteristics:  Nutrition Interventions:    Radiology Studies: DG Foot 2 Views Right  Result Date: 06/03/2021 CLINICAL DATA:  Right foot pain. EXAM: RIGHT FOOT - 2 VIEW COMPARISON:  None. FINDINGS: There is no evidence of fracture or dislocation. There is no evidence of arthropathy or other focal bone abnormality. Soft tissues are unremarkable. IMPRESSION: Negative. Electronically Signed   By: 08/03/2021 M.D.   On: 06/03/2021 15:05    Scheduled Meds:  diclofenac Sodium  2 g Topical QID   losartan  50 mg Oral Daily   rivaroxaban  20 mg Oral Daily   Continuous Infusions:  cefTRIAXone (ROCEPHIN)  IV Stopped (06/04/21 1719)   magnesium sulfate bolus IVPB     vancomycin 1,000 mg (06/05/21 0904)    LOS: 2 days   08/05/21, DO [facility] PAGER is on AMION  If 7PM-7AM, please contact night-coverage [email or web address]

## 2021-06-05 NOTE — Plan of Care (Signed)
Continuing with plan of care. 

## 2021-06-05 NOTE — Consult Note (Signed)
Pharmacy Antibiotic Note  Ryan [name] is a 50 y.o. male admitted on 06/03/2021 with right foot cellulitis.  Pharmacy has been consulted for vancomycin dosing. Pt is on day 3 of antibiotics. Blood cultures have NGTD x 48h. Afebrile with a Tmax of 98.6. Lactic Acid corrected from 2.5>1.7. Procal <0.1. Pt is currently leukopenic/neutropenic WBC 4.4>3.7 and a ANC 1.6, respectively. Pt Scr baseline: 1.3- 1.6. Current Scr as stated below.   Plan: Pt received total Vancomycin 2500 mg IV loading dose followed by vancomycin 1500 mg BID. Pt dose was adjusted to 1000 mg BID based on AUC calculations. Plan to continue Vancomycin 1000 mg BID. Continue to monitor renal function and obtain vancomycin levels prior to the 4th dose. Obtain levels 10/8 AM ~@0700 .  Pt is also ordered ceftriaxone 2 g IV q24h    Predicted AUC is 431 Goal AUC of 400-550  Scr used 1.29 and Vd of 0.5    Height: 5\' 7"  (170.2 cm) Weight: (!) 163.9 kg (361 lb 5.3 oz) IBW/kg (Calculated) : 66.1  Temp (24hrs), Avg:98.4 F (36.9 C), Min:97.9 F (36.6 C), Max:98.8 F (37.1 C)  Recent Labs  Lab 06/03/21 1209 06/03/21 1410 06/04/21 0317 06/05/21 0503  WBC 4.4  --  5.6 3.7*  CREATININE 1.45*  --  1.29* 1.12  LATICACIDVEN 2.5* 1.7  --   --     Estimated Creatinine Clearance: 116.1 mL/min (by C-G formula based on SCr of 1.12 mg/dL).    Allergies  Allergen Reactions   Iodinated Diagnostic Agents Other (See Comments)    Kidney failure requiring dialysis Kidney failure requiring dialysis Kidney failure requiring dialysis    Metrizamide Other (See Comments)    Kidney failure requiring dialysis Kidney failure requiring dialysis    Clindamycin/Lincomycin Rash   Lincomycin Rash   Sulfa Antibiotics Rash    Antimicrobials this admission: Zosyn x 1 (10/5)  Vancomycin (10/5>>  Ceftriaxone (10/5>>   Dose adjustments this admission: 10/5 Vancomycin loading 2500 mg BID, then  (10/5)vancomycin 1500 mg BID>> (10/6) vancomycin  1000 mg BID  Microbiology results: 10/5 BCx: NG x 48h   Thank you for allowing pharmacy to be a part of this patient's care.  08/05/21 Giulliana Mcroberts,PharmD Candidate 23'  06/05/2021 1:33 PM

## 2021-06-06 ENCOUNTER — Inpatient Hospital Stay: Payer: Commercial Managed Care - PPO

## 2021-06-06 ENCOUNTER — Encounter: Payer: Self-pay | Admitting: Radiology

## 2021-06-06 DIAGNOSIS — I5032 Chronic diastolic (congestive) heart failure: Secondary | ICD-10-CM | POA: Diagnosis not present

## 2021-06-06 DIAGNOSIS — L03115 Cellulitis of right lower limb: Secondary | ICD-10-CM | POA: Diagnosis not present

## 2021-06-06 LAB — CBC WITH DIFFERENTIAL/PLATELET
Abs Immature Granulocytes: 0.01 10*3/uL (ref 0.00–0.07)
Basophils Absolute: 0 10*3/uL (ref 0.0–0.1)
Basophils Relative: 1 %
Eosinophils Absolute: 0.2 10*3/uL (ref 0.0–0.5)
Eosinophils Relative: 5 %
HCT: 40 % (ref 39.0–52.0)
Hemoglobin: 13 g/dL (ref 13.0–17.0)
Immature Granulocytes: 0 %
Lymphocytes Relative: 34 %
Lymphs Abs: 1.4 10*3/uL (ref 0.7–4.0)
MCH: 30.9 pg (ref 26.0–34.0)
MCHC: 32.5 g/dL (ref 30.0–36.0)
MCV: 95 fL (ref 80.0–100.0)
Monocytes Absolute: 0.7 10*3/uL (ref 0.1–1.0)
Monocytes Relative: 17 %
Neutro Abs: 1.8 10*3/uL (ref 1.7–7.7)
Neutrophils Relative %: 43 %
Platelets: 348 10*3/uL (ref 150–400)
RBC: 4.21 MIL/uL — ABNORMAL LOW (ref 4.22–5.81)
RDW: 14.5 % (ref 11.5–15.5)
WBC: 4.1 10*3/uL (ref 4.0–10.5)
nRBC: 0 % (ref 0.0–0.2)

## 2021-06-06 LAB — COMPREHENSIVE METABOLIC PANEL
ALT: 39 U/L (ref 0–44)
AST: 29 U/L (ref 15–41)
Albumin: 3.2 g/dL — ABNORMAL LOW (ref 3.5–5.0)
Alkaline Phosphatase: 47 U/L (ref 38–126)
Anion gap: 9 (ref 5–15)
BUN: 18 mg/dL (ref 6–20)
CO2: 24 mmol/L (ref 22–32)
Calcium: 8.8 mg/dL — ABNORMAL LOW (ref 8.9–10.3)
Chloride: 102 mmol/L (ref 98–111)
Creatinine, Ser: 1.38 mg/dL — ABNORMAL HIGH (ref 0.61–1.24)
GFR, Estimated: >60 mL/min (ref >60)
Glucose, Bld: 118 mg/dL — ABNORMAL HIGH (ref 70–99)
Potassium: 4.5 mmol/L (ref 3.5–5.1)
Sodium: 135 mmol/L (ref 135–145)
Total Bilirubin: 0.7 mg/dL (ref 0.3–1.2)
Total Protein: 8 g/dL (ref 6.5–8.1)

## 2021-06-06 LAB — PHOSPHORUS: Phosphorus: 3.8 mg/dL (ref 2.5–4.6)

## 2021-06-06 LAB — MAGNESIUM: Magnesium: 1.8 mg/dL (ref 1.7–2.4)

## 2021-06-06 LAB — VANCOMYCIN, TROUGH: Vancomycin Tr: 14 ug/mL — ABNORMAL LOW (ref 15–20)

## 2021-06-06 LAB — VANCOMYCIN, PEAK: Vancomycin Pk: 27 ug/mL — ABNORMAL LOW (ref 30–40)

## 2021-06-06 MED ORDER — AMOXICILLIN-POT CLAVULANATE 875-125 MG PO TABS: 1.0000 | ORAL_TABLET | Freq: Two times a day (BID) | ORAL | 0 refills | Status: AC

## 2021-06-06 MED ORDER — DOXYCYCLINE MONOHYDRATE 100 MG PO TABS: 100.0000 mg | ORAL_TABLET | Freq: Two times a day (BID) | ORAL | 0 refills | Status: AC

## 2021-06-06 MED ORDER — COVID-19MRNA BIVAL VACC PFIZER 30 MCG/0.3ML IM SUSP
0.3000 mL | Freq: Once | INTRAMUSCULAR | Status: DC
  Filled 2021-06-06: qty 0.3

## 2021-06-06 MED ORDER — GADOBUTROL 1 MMOL/ML IV SOLN
10.0000 mL | Freq: Once | INTRAVENOUS | Status: AC | PRN
  Administered 2021-06-06: 10 mL via INTRAVENOUS

## 2021-06-06 NOTE — Progress Notes (Signed)
Occupational Therapy Treatment Patient Details Name: Ryan [name] MRN: [id number] DOB: [birth year] Today's Date: 06/06/2021   History of present illness 50 y.o. male presenting to ED with R foot pain and persistent infection for 3 weeks. Medical history significant of HTN, pre-DM, DVT/PE on Xarelto, dCHF,, PVD, CKD stage IIIa, atrial fibrillation (s/p of ablation per pt, triggered by COVID infection).   OT comments   Pt. Reports having 2/10 pain in the right ankle. Pt. Reports 10/10 pain in the ankle when walking to the bathroom earlier in the morning with an increased recovering time. Out of bed activity was deferred secondary to 10/10 anlkle pain when up. Pt. education was provided about A/E use for LE ADLs. Pt. reports having a reacher, and sockaide at home. Reviewed pt. Routines at home, as well as work simplification strategies for ADL/IADLs. Pt. Continues to benefit from OT services for ADL training, A/E training, and pt. Education about home modification, and DME.    Recommendations for follow up therapy are one component of a multi-disciplinary discharge planning process, led by the attending physician.  Recommendations may be updated based on patient status, additional functional criteria and insurance authorization.    Follow Up Recommendations  Home health OT    Equipment Recommendations  3 in 1 bedside commode;Other (comment)    Recommendations for Other Services      Precautions / Restrictions Precautions Precautions: Fall Restrictions Weight Bearing Restrictions: No       Mobility Bed Mobility Overal bed mobility: Modified Independent                  Transfers                 General transfer comment: Deferred secondary to  10/10  pain with movement    Balance                                           ADL either performed or assessed with clinical judgement   ADL                   Upper Body Dressing : Independent    Lower Body Dressing: Minimal assistance                       Vision Patient Visual Report: No change from baseline     Perception     Praxis      Cognition Arousal/Alertness: Awake/alert Behavior During Therapy: WFL for tasks assessed/performed Overall Cognitive Status: Within Functional Limits for tasks assessed                                          Exercises     Shoulder Instructions       General Comments      Pertinent Vitals/ Pain       Pain Assessment: 0-10 Pain Score: 2  (2/10 at rest, 10/10 with movement) Pain Location: Right ankle Pain Descriptors / Indicators: Tightness;Sore Pain Intervention(s): Limited activity within patient's tolerance  Home Living  Prior Functioning/Environment              Frequency  Min 1X/week        Progress Toward Goals  OT Goals(current goals can now be found in the care plan section)  Progress towards OT goals: Progressing toward goals  Acute Rehab OT Goals OT Goal Formulation: With patient Time For Goal Achievement: 06/19/21 Potential to Achieve Goals: Good  Plan      Co-evaluation                 AM-PAC OT "6 Clicks" Daily Activity     Outcome Measure   Help from another person eating meals?: None   Help from another person toileting, which includes using toliet, bedpan, or urinal?: None Help from another person bathing (including washing, rinsing, drying)?: A Little Help from another person to put on and taking off regular upper body clothing?: None Help from another person to put on and taking off regular lower body clothing?: A Little 6 Click Score: 18    End of Session Equipment Utilized During Treatment: Rolling walker  OT Visit Diagnosis: Other abnormalities of gait and mobility (R26.89);Pain Pain - Right/Left: Right Pain - part of body: Ankle and joints of foot   Activity Tolerance Patient  tolerated treatment well   Patient Left in bed;with call bell/phone within reach   Nurse Communication Mobility status        Time: 2542-7062 OT Time Calculation (min): 15 min  Charges: OT General Charges $OT Visit: 1 Visit OT Treatments $Self Care/Home Management : 8-22 mins  Olegario Messier, MS, OTR/L   Olegario Messier 06/06/2021, 1:07 PM

## 2021-06-06 NOTE — Consult Note (Signed)
Pharmacy Antibiotic Note  Ryan [name] is a 50 y.o. male admitted on 06/03/2021 with right foot cellulitis. Pharmacy has been consulted for vancomycin dosing.  Plan: Pt received total Vancomycin 2500 mg IV loading dose followed by vancomycin 1500 mg BID. Will adjust vancomycin dose to 1000 mg BID based on AUC epiric calculation  Levels obtained 06/06/2021 Peak 27 @ 1019 (dose given 0814) Trough 14 @ 1939  Calculated PK parameters Vd 58.1 Vanco CL 68.2 Steady state AUC at current dose 489.0  Will continue Vancomycin 1000mg  q 12 hours Goal AUC 400-550   Height: 5\' 7"  (170.2 cm) Weight: (!) 161.6 kg (356 lb 4.2 oz) IBW/kg (Calculated) : 66.1  Temp (24hrs), Avg:98.5 F (36.9 C), Min:98.1 F (36.7 C), Max:99 F (37.2 C)  Recent Labs  Lab 06/03/21 1209 06/03/21 1410 06/04/21 0317 06/05/21 0503 06/06/21 0429 06/06/21 1019 06/06/21 1939  WBC 4.4  --  5.6 3.7* 4.1  --   --   CREATININE 1.45*  --  1.29* 1.12 1.38*  --   --   LATICACIDVEN 2.5* 1.7  --   --   --   --   --   VANCOTROUGH  --   --   --   --   --   --  14*  VANCOPEAK  --   --   --   --   --  27*  --      Estimated Creatinine Clearance: 93.4 mL/min (A) (by C-G formula based on SCr of 1.38 mg/dL (H)).    Allergies  Allergen Reactions   Iodinated Diagnostic Agents Other (See Comments)    Kidney failure requiring dialysis Kidney failure requiring dialysis Kidney failure requiring dialysis    Metrizamide Other (See Comments)    Kidney failure requiring dialysis Kidney failure requiring dialysis    Clindamycin/Lincomycin Rash   Lincomycin Rash   Sulfa Antibiotics Rash    Antimicrobials this admission: 10/5 Zosyn x 1 10/5 vancomycin >>  10/5 ceftriaxone >>   Microbiology results: 10/5 BCx: NG x 3 days  Thank you for allowing pharmacy to be a part of this patient's care.  Dehaven Sine Rodriguez-Guzman PharmD, BCPS 06/06/2021 9:03 PM

## 2021-06-06 NOTE — Plan of Care (Signed)
Continuing with plan of care. 

## 2021-06-06 NOTE — Discharge Summary (Addendum)
Physician Discharge Summary  Patient ID: Ryan [name] MRN: [id number] DOB/AGE: 1971-08-04 50 y.o.  Admit date: 06/03/2021 Discharge date: 06/06/2021  Admission Diagnoses:  Discharge Diagnoses:  Principal Problem:   Cellulitis of right foot Active Problems:   DVT (deep venous thrombosis) (HCC)   Chronic diastolic CHF (congestive heart failure) (HCC)   HTN (hypertension)   Pulmonary embolus (HCC)   CKD (chronic kidney disease), stage IIIa   Discharged Condition: good  Hospital Course:  The patient is a 50 year old super morbidly obese African-American male with a past medical history significant for but not limited to hypertension, prediabetes mellitus, history of DVT and PE on Xarelto, history of diastolic CHF that is chronic, PVD, CKD stage IIIa, history of atrial fibrillation status post ablation as well as other comorbidities who presented with right foot pain.  Foot x-ray did not show any bony changes, no joint effusion.  Patient was diagnosed with cellulitis, he is treated with vancomycin and Rocephin. Patient has been treated for 3 days, condition much improved.  A repeat x-ray still did not see any joint effusion or bony changes. At this point, I will continue 7 days antibiotics with Augmentin and doxycycline.  Right foot cellulitis. Continue antibiotics with doxycycline and Augmentin for 7 days, follow-up with PCP in 1 week time.  History of A. fib status post ablation. Patient currently in sinus.  History of PE and bilateral DVT. Continue Xarelto.  Chronic kidney disease stage IIIa. Renal function still stable.  Essential hypertension  Resume losartan.  Chronic diastolic congestive heart failure. Stable.  Thrombocytosis. Appears to be reactive secondary to infection.  Morbid obesity.  With BMI 56.59.   Addendum: Patient complains of severe right ankle pain. Will obtain MRI to r/o osteomyelitis/septic joint.   Consults: None  Significant Diagnostic  Studies:   Treatments: Vancomycin and Rocephin.  Discharge Exam: Blood pressure 118/67, pulse 99, temperature 99 F (37.2 C), temperature source Oral, resp. rate 20, height 5\' 7"  (1.702 m), weight (!) 161.6 kg, SpO2 93 %. General appearance: alert and cooperative Resp: clear to auscultation bilaterally Cardio: regular rate and rhythm, S1, S2 normal, no murmur, click, rub or gallop GI: soft, non-tender; bowel sounds normal; no masses,  no organomegaly Extremities: Right foot still has some swelling, not much tender, skin no longer feels hot.  Disposition: Discharge disposition: 01-Home or Self Care       Discharge Instructions     Diet - low sodium heart healthy   Complete by: As directed    Increase activity slowly   Complete by: As directed       Allergies as of 06/06/2021       Reactions   Iodinated Diagnostic Agents Other (See Comments)   Kidney failure requiring dialysis Kidney failure requiring dialysis Kidney failure requiring dialysis   Metrizamide Other (See Comments)   Kidney failure requiring dialysis Kidney failure requiring dialysis   Clindamycin/lincomycin Rash   Lincomycin Rash   Sulfa Antibiotics Rash        Medication List     STOP taking these medications    cephALEXin 500 MG capsule Commonly known as: KEFLEX   diltiazem 120 MG 24 hr capsule Commonly known as: TIAZAC   hydrochlorothiazide 25 MG tablet Commonly known as: HYDRODIURIL   lisinopril 10 MG tablet Commonly known as: ZESTRIL   metoprolol 200 MG 24 hr tablet Commonly known as: TOPROL-XL   metoprolol tartrate 25 MG tablet Commonly known as: LOPRESSOR   Multaq 400 MG tablet Generic drug: dronedarone  TAKE these medications    amoxicillin-clavulanate 875-125 MG tablet Commonly known as: Augmentin Take 1 tablet by mouth 2 (two) times daily for 7 days.   chlorthalidone 25 MG tablet Commonly known as: HYGROTON Take 25 mg by mouth daily.   doxycycline 100 MG  tablet Commonly known as: ADOXA Take 1 tablet (100 mg total) by mouth 2 (two) times daily for 7 days.   losartan 50 MG tablet Commonly known as: COZAAR Take 50 mg by mouth daily.   rivaroxaban 20 MG Tabs tablet Commonly known as: XARELTO Take 1 tablet (20 mg total) by mouth daily with supper. New blood thinner. What changed: Another medication with the same name was removed. Continue taking this medication, and follow the directions you see here.        Follow-up Information     Sherrie Mustache, MD Follow up in 1 week(s).   Specialty: Internal Medicine Contact information: [address] [place] [address] [phone]                 Signed: Marrion Coy 06/06/2021, 1:37 PM

## 2021-06-07 DIAGNOSIS — N1831 Chronic kidney disease, stage 3a: Secondary | ICD-10-CM | POA: Diagnosis not present

## 2021-06-07 DIAGNOSIS — L03115 Cellulitis of right lower limb: Secondary | ICD-10-CM | POA: Diagnosis not present

## 2021-06-07 DIAGNOSIS — I5032 Chronic diastolic (congestive) heart failure: Secondary | ICD-10-CM | POA: Diagnosis not present

## 2021-06-07 MED ORDER — OXYCODONE-ACETAMINOPHEN 5-325 MG PO TABS: 1.0000 | ORAL_TABLET | ORAL | 0 refills | Status: DC | PRN

## 2021-06-07 NOTE — Discharge Summary (Signed)
ThePhysician Discharge Summary  Patient ID: Ryan [name] MRN: [id number] DOB/AGE: 04-Apr-1971 50 y.o.  Admit date: 06/03/2021 Discharge date: 06/07/2021  Admission Diagnoses:  Discharge Diagnoses:  Principal Problem:   Cellulitis of right foot Active Problems:   DVT (deep venous thrombosis) (HCC)   Chronic diastolic CHF (congestive heart failure) (HCC)   HTN (hypertension)   Pulmonary embolus (HCC)   CKD (chronic kidney disease), stage IIIa   Discharged Condition: good  Hospital Course:   The patient is a 50 year old super morbidly obese African-American male with a past medical history significant for but not limited to hypertension, prediabetes mellitus, history of DVT and PE on Xarelto, history of diastolic CHF that is chronic, PVD, CKD stage IIIa, history of atrial fibrillation status post ablation as well as other comorbidities who presented with right foot pain.  Foot x-ray did not show any bony changes, no joint effusion.  Patient was diagnosed with cellulitis, he is treated with vancomycin and Rocephin. Patient has been treated for 3 days, condition much improved.  A repeat x-ray still did not see any joint effusion or bony changes. At this point, I will continue 7 days antibiotics with Augmentin and doxycycline.   Right foot cellulitis. Continue antibiotics with doxycycline and Augmentin for 7 days, follow-up with PCP in 1 week time. Patient had a more pain yesterday, obtain MRI, did not show any osteomyelitis/septic joint.  Patient condition has improved, swelling is better after elevate foot last night.  I will give as needed pain medicine for the next 3 days and patient be followed with PCP as outpatient.   History of A. fib status post ablation. Patient currently in sinus.   History of PE and bilateral DVT. Continue Xarelto.  Chronic kidney disease stage IIIa. Renal function still stable.  Essential hypertension  Resume losartan.   Chronic diastolic congestive  heart failure. Stable.  Thrombocytosis. Appears to be reactive secondary to infection.   Morbid obesity.  With BMI 56.59.   Consults: None  Significant Diagnostic Studies:  MRI OF THE RIGHT ANKLE WITHOUT AND WITH CONTRAST   TECHNIQUE: Multiplanar, multisequence MR imaging of the ankle was performed before and after the administration of intravenous contrast.   CONTRAST:  45mL GADAVIST GADOBUTROL 1 MMOL/ML IV SOLN   COMPARISON:  None.   FINDINGS: TENDONS   Peroneal: Peroneal longus tendon intact. Peroneal brevis intact.   Posteromedial: Posterior tibial tendon intact. Flexor hallucis longus tendon intact. Flexor digitorum longus tendon intact.   Anterior: Tibialis anterior tendon intact. Extensor hallucis longus tendon intact Extensor digitorum longus tendon intact.   Achilles: Mild tendinosis of the distal Achilles tendon without a tear.   Plantar Fascia: Intact.   LIGAMENTS   Lateral: Anterior talofibular ligament intact. Calcaneofibular ligament intact. Posterior talofibular ligament intact. Anterior and posterior tibiofibular ligaments intact.   Medial: Deltoid ligament intact. Spring ligament intact.   CARTILAGE   Ankle Joint: No joint effusion. Normal ankle mortise. No chondral defect.   Subtalar Joints/Sinus Tarsi: Normal subtalar joints. No subtalar joint effusion. Normal sinus tarsi.   Bones: No marrow signal abnormality.  No fracture or dislocation.   Soft Tissue: No fluid collection or hematoma. Muscles are normal without edema or atrophy. Tarsal tunnel is normal. Mild generalized soft tissue edema around the ankle and dorsal aspect of the foot with enhancement. Generalized muscle atrophy of the plantar musculature. Mild T2 hyperintense signal within the plantar musculature, distal flexor digitorum muscle, and soleus muscle likely neurogenic.   IMPRESSION: IMPRESSION 1. No osteomyelitis  of the right ankle. 2. Mild soft tissue edema around  the ankle and dorsal aspect of the with mild enhancement concerning for mild cellulitis. No drainable fluid collection to suggest an abscess.     Electronically Signed   By: Elige Ko M.D.   On: 06/07/2021 09:02   Treatments: Vancomycin and Rocephin.  Discharge Exam: Blood pressure (!) 135/91, pulse 89, temperature 98.2 F (36.8 C), temperature source Oral, resp. rate 18, height 5\' 7"  (1.702 m), weight (!) 161.6 kg, SpO2 94 %. General appearance: alert and cooperative Resp: clear to auscultation bilaterally Cardio: regular rate and rhythm, S1, S2 normal, no murmur, click, rub or gallop GI: soft, non-tender; bowel sounds normal; no masses,  no organomegaly Extremities: Right foot swelling much better.  Disposition: Discharge disposition: 01-Home or Self Care       Discharge Instructions     Diet - low sodium heart healthy   Complete by: As directed    Increase activity slowly   Complete by: As directed       Allergies as of 06/07/2021       Reactions   Iodinated Diagnostic Agents Other (See Comments)   Kidney failure requiring dialysis Kidney failure requiring dialysis Kidney failure requiring dialysis   Metrizamide Other (See Comments)   Kidney failure requiring dialysis Kidney failure requiring dialysis   Clindamycin/lincomycin Rash   Lincomycin Rash   Sulfa Antibiotics Rash        Medication List     STOP taking these medications    cephALEXin 500 MG capsule Commonly known as: KEFLEX   diltiazem 120 MG 24 hr capsule Commonly known as: TIAZAC   hydrochlorothiazide 25 MG tablet Commonly known as: HYDRODIURIL   lisinopril 10 MG tablet Commonly known as: ZESTRIL   metoprolol 200 MG 24 hr tablet Commonly known as: TOPROL-XL   metoprolol tartrate 25 MG tablet Commonly known as: LOPRESSOR   Multaq 400 MG tablet Generic drug: dronedarone       TAKE these medications    amoxicillin-clavulanate 875-125 MG tablet Commonly known as:  Augmentin Take 1 tablet by mouth 2 (two) times daily for 7 days.   chlorthalidone 25 MG tablet Commonly known as: HYGROTON Take 25 mg by mouth daily.   doxycycline 100 MG tablet Commonly known as: ADOXA Take 1 tablet (100 mg total) by mouth 2 (two) times daily for 7 days.   losartan 50 MG tablet Commonly known as: COZAAR Take 50 mg by mouth daily.   oxyCODONE-acetaminophen 5-325 MG tablet Commonly known as: PERCOCET/ROXICET Take 1 tablet by mouth every 4 (four) hours as needed for moderate pain.   rivaroxaban 20 MG Tabs tablet Commonly known as: XARELTO Take 1 tablet (20 mg total) by mouth daily with supper. New blood thinner. What changed: Another medication with the same name was removed. Continue taking this medication, and follow the directions you see here.        Follow-up Information     08/07/2021, MD Follow up in 1 week(s).   Specialty: Internal Medicine Contact information: [address] [place] [phone]                 Signed: [phone] 06/07/2021, 10:14 AM

## 2021-06-07 NOTE — Plan of Care (Signed)
Discharge teaching completed with patient who is in stable condition. 

## 2021-06-07 NOTE — Plan of Care (Signed)
Continuing with plan of care. 

## 2021-06-08 ENCOUNTER — Encounter (INDEPENDENT_AMBULATORY_CARE_PROVIDER_SITE_OTHER): Payer: Commercial Managed Care - PPO

## 2021-06-08 ENCOUNTER — Other Ambulatory Visit (INDEPENDENT_AMBULATORY_CARE_PROVIDER_SITE_OTHER): Payer: Self-pay | Admitting: Nurse Practitioner

## 2021-06-08 ENCOUNTER — Ambulatory Visit (INDEPENDENT_AMBULATORY_CARE_PROVIDER_SITE_OTHER): Payer: Commercial Managed Care - PPO | Admitting: Nurse Practitioner

## 2021-06-08 DIAGNOSIS — M79604 Pain in right leg: Secondary | ICD-10-CM

## 2021-06-08 LAB — CULTURE, BLOOD (ROUTINE X 2)
Culture: NO GROWTH
Culture: NO GROWTH
Special Requests: ADEQUATE
Special Requests: ADEQUATE

## 2021-06-08 LAB — HEMOGLOBIN A1C
Hgb A1c MFr Bld: 6.6 % — ABNORMAL HIGH (ref 4.8–5.6)
Mean Plasma Glucose: 143 mg/dL

## 2021-06-10 ENCOUNTER — Encounter (INDEPENDENT_AMBULATORY_CARE_PROVIDER_SITE_OTHER): Payer: Commercial Managed Care - PPO

## 2021-06-10 ENCOUNTER — Ambulatory Visit (INDEPENDENT_AMBULATORY_CARE_PROVIDER_SITE_OTHER): Payer: Commercial Managed Care - PPO | Admitting: Nurse Practitioner

## 2021-06-25 ENCOUNTER — Other Ambulatory Visit: Payer: Self-pay

## 2021-06-25 ENCOUNTER — Ambulatory Visit (INDEPENDENT_AMBULATORY_CARE_PROVIDER_SITE_OTHER): Payer: Commercial Managed Care - PPO

## 2021-06-25 DIAGNOSIS — M79604 Pain in right leg: Secondary | ICD-10-CM

## 2021-06-26 ENCOUNTER — Ambulatory Visit (INDEPENDENT_AMBULATORY_CARE_PROVIDER_SITE_OTHER): Payer: Commercial Managed Care - PPO | Admitting: Nurse Practitioner

## 2021-06-26 ENCOUNTER — Encounter (INDEPENDENT_AMBULATORY_CARE_PROVIDER_SITE_OTHER): Payer: Self-pay | Admitting: Nurse Practitioner

## 2021-06-26 VITALS — BP 157/104 | HR 83 | Ht 67.0 in | Wt 356.0 lb

## 2021-06-26 DIAGNOSIS — I10 Essential (primary) hypertension: Secondary | ICD-10-CM

## 2021-06-26 DIAGNOSIS — M79604 Pain in right leg: Secondary | ICD-10-CM | POA: Diagnosis not present

## 2021-06-26 DIAGNOSIS — L03115 Cellulitis of right lower limb: Secondary | ICD-10-CM | POA: Diagnosis not present

## 2021-06-26 MED ORDER — TRAMADOL HCL 50 MG PO TABS: 50.0000 mg | ORAL_TABLET | Freq: Four times a day (QID) | ORAL | 0 refills | Status: DC | PRN

## 2021-06-28 ENCOUNTER — Encounter (INDEPENDENT_AMBULATORY_CARE_PROVIDER_SITE_OTHER): Payer: Self-pay | Admitting: Nurse Practitioner

## 2021-06-28 NOTE — Progress Notes (Signed)
Subjective:    Patient ID: Ryan [name], male    DOB: 04-22-1971, 50 y.o.   MRN: [id number] Chief Complaint  Patient presents with   Follow-up    [facility] ED DVT cellulitis add on per phone note U/S    Ryan [name] is a 50 year old male that presents today for evaluation of noninvasive studies with following a recent hospitalization with cellulitis.  He notes that he was recently hospitalized for approximately 5 days [facility] due to cellulitis.  The patient has a known history of lymphedema.  The patient has medical grade compression stockings however he notes that he does not wear them on a regular basis.  He also has a lymphedema pump that he has had for approximately 10 years he does not utilize.  Patient has also had a previous history of multiple DVTs bilaterally.  Noninvasive studies done yesterday showed ABI 1.12 bilaterally with a TBI of 1.03 on the right and 0.98 on the left.  He has biphasic waveforms on the right with triphasic/biphasic on the left with good toe waveforms bilaterally.  Reflux studies today show reflux in the right great saphenous vein at the mid thigh.  There is some evidence of a swollen lymph node in the right groin consistent with recent diagnosis of cellulitis.  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   Review of Systems  Cardiovascular:  Positive for leg swelling.  All other systems reviewed and are negative.     Objective:   Physical Exam Vitals reviewed.  HENT:     Head: Normocephalic.  Cardiovascular:     Rate and Rhythm: Normal rate.  Pulmonary:     Effort: Pulmonary effort is normal.  Musculoskeletal:     Right lower leg: 2+ Edema present.     Left lower leg: 2+ Edema present.  Skin:    General: Skin is dry.  Neurological:     Mental Status: He is alert and oriented to person, place, and  time.  Psychiatric:        Mood and Affect: Mood normal.        Behavior: Behavior normal.        Thought Content: Thought content normal.        Judgment: Judgment normal.    BP (!) 157/104   Pulse 83   Ht 5\' 7"  (1.702 m)   Wt (!) 356 lb (161.5 kg)   BMI 55.76 kg/m   Past Medical History:  Diagnosis Date   CHF (congestive heart failure) (HCC)    Diabetes mellitus without complication (HCC)    per pt-pre diabetic-Dr stated said pt   DVT (deep venous thrombosis) (HCC)    Hypertension    Peripheral vascular disease (HCC)    Presence of IVC filter     Social History   Socioeconomic History   Marital status: Married    Spouse name: Not on file   Number of children: Not on file   Years of education: Not on file   Highest education level: Not on file  Occupational History   Not on file  Tobacco Use   Smoking status: Never   Smokeless tobacco: Never  Vaping Use   Vaping Use: Never used  Substance and Sexual Activity   Alcohol use: No   Drug use: No   Sexual activity: Yes  Other Topics Concern  Not on file  Social History Narrative   Not on file   Social Determinants of Health   Financial Resource Strain: Not on file  Food Insecurity: Not on file  Transportation Needs: Not on file  Physical Activity: Not on file  Stress: Not on file  Social Connections: Not on file  Intimate Partner Violence: Not on file    Past Surgical History:  Procedure Laterality Date   PERIPHERAL VASCULAR CATHETERIZATION N/A 01/10/2015   Procedure: Dialysis/Perma Catheter Insertion;  Surgeon: Renford Dills, MD;  Location: [facility] INVASIVE [facility];  Service: Cardiovascular;  Laterality: N/A;   PERIPHERAL VASCULAR CATHETERIZATION N/A 02/24/2015   Procedure: Dialysis/Perma Catheter Removal;  Surgeon: Annice Needy, MD;  Location: [facility] INVASIVE CV LAB;  Service: Cardiovascular;  Laterality: N/A;    Family History  Problem Relation Age of Onset   Hypertension Mother    Cancer  Mother    Breast cancer Mother    Prostate cancer Father    Hypertension Father    Diabetes Father     Allergies  Allergen Reactions   Iodinated Diagnostic Agents Other (See Comments)    Kidney failure requiring dialysis Kidney failure requiring dialysis Kidney failure requiring dialysis    Metrizamide Other (See Comments)    Kidney failure requiring dialysis Kidney failure requiring dialysis    Clindamycin/Lincomycin Rash   Lincomycin Rash   Sulfa Antibiotics Rash    CBC Latest Ref Rng & Units 06/06/2021 06/05/2021 06/04/2021  WBC 4.0 - 10.5 K/uL 4.1 3.7(L) 5.6  Hemoglobin 13.0 - 17.0 g/dL 29.5 18.8 41.6  Hematocrit 39.0 - 52.0 % 40.0 40.2 42.1  Platelets 150 - 400 K/uL 348 347 402(H)      CMP     Component Value Date/Time   NA 135 06/06/2021 0429   NA 130 (L) 12/28/2014 1602   K 4.5 06/06/2021 0429   K 3.8 12/28/2014 1602   CL 102 06/06/2021 0429   CL 94 (L) 12/28/2014 1602   CO2 24 06/06/2021 0429   CO2 24 12/28/2014 1602   GLUCOSE 118 (H) 06/06/2021 0429   GLUCOSE 121 (H) 12/28/2014 1602   BUN 18 06/06/2021 0429   BUN 57 (H) 12/28/2014 1602   CREATININE 1.38 (H) 06/06/2021 0429   CREATININE 5.55 (H) 12/28/2014 1602   CALCIUM 8.8 (L) 06/06/2021 0429   CALCIUM 8.1 (L) 12/28/2014 1602   PROT 8.0 06/06/2021 0429   PROT 8.0 12/23/2014 2219   ALBUMIN 3.2 (L) 06/06/2021 0429   ALBUMIN 3.9 12/23/2014 2219   AST 29 06/06/2021 0429   AST 19 12/23/2014 2219   ALT 39 06/06/2021 0429   ALT 24 12/23/2014 2219   ALKPHOS 47 06/06/2021 0429   ALKPHOS 54 12/23/2014 2219   BILITOT 0.7 06/06/2021 0429   BILITOT 1.3 (H) 12/23/2014 2219   GFRNONAA >60 06/06/2021 0429   GFRNONAA 11 (L) 12/28/2014 1602   GFRAA 55 (L) 03/07/2020 2009   GFRAA 13 (L) 12/28/2014 1602     VAS [place] ABI WITH/WO TBI  Result Date: 06/26/2021  LOWER EXTREMITY DOPPLER STUDY Patient Name:  Ryan [name]  Date of Exam:   06/25/2021 Medical Rec #: [id number]      Accession #:    [id number] Date of  Birth: 1971/02/14      Patient Gender: M Patient Age:   13 years Exam Location:  [facility] Procedure:      VAS [place] ABI WITH/WO TBI Referring Phys: Sheppard Plumber --------------------------------------------------------------------------------  Indications: Rest pain.  Performing Technologist: Reece Agar RT (R)(VS)  Examination Guidelines: A complete evaluation includes at minimum, Doppler waveform signals and systolic blood pressure reading at the level of bilateral brachial, anterior tibial, and posterior tibial arteries, when vessel segments are accessible. Bilateral testing is considered an integral part of a complete examination. Photoelectric Plethysmograph (PPG) waveforms and toe systolic pressure readings are included as required and additional duplex testing as needed. Limited examinations for reoccurring indications may be performed as noted.  ABI Findings: +---------+------------------+-----+--------+--------+ Right    Rt Pressure (mmHg)IndexWaveformComment  +---------+------------------+-----+--------+--------+ Brachial 136                                     +---------+------------------+-----+--------+--------+ ATA      189                    biphasic         +---------+------------------+-----+--------+--------+ PTA      165               1.12 biphasic         +---------+------------------+-----+--------+--------+ Great Toe152               1.03 Normal           +---------+------------------+-----+--------+--------+ +---------+------------------+-----+---------+-------+ Left     Lt Pressure (mmHg)IndexWaveform Comment +---------+------------------+-----+---------+-------+ Brachial 147                                     +---------+------------------+-----+---------+-------+ ATA      164                    triphasic        +---------+------------------+-----+---------+-------+ PTA      165               1.12 biphasic          +---------+------------------+-----+---------+-------+ Great Toe144               0.98 Normal           +---------+------------------+-----+---------+-------+  Summary: Right: Resting right ankle-brachial index is within normal range. No evidence of significant right lower extremity arterial disease. The right toe-brachial index is normal. Left: Resting left ankle-brachial index is within normal range. No evidence of significant left lower extremity arterial disease. The left toe-brachial index is normal.  *See table(s) above for measurements and observations.  Electronically signed by Festus Barren MD on 06/26/2021 at 2:14:49 PM.    Final        Assessment & Plan:   1. Cellulitis of right foot We had a long discussion regarding the pathology of cellulitis today.  Cellulitis occurs due to worsening swelling of his lower extremity and worsening of his lymphedema.  The patient has lymphedema pump at home however he has not used it in years.  He is advised to begin to use it again on a daily basis beginning in the evening for approximately an hour.  He is also advised to be utilizing his medical grade compression stockings daily.  He should be placed first thing in the morning remove before bedtime and he should not be slept in.  Motion of the legs is also very important for reducing the recurrence of cellulitis.  Patient is advised to elevate lower extremities when not ambulatory.  Also activity about 30 to 40 minutes  4 to 5 days a week is also helpful with controlling lower extremity edema.  We will have the patient return in 3 months to evaluate progress with the use conservative treatment tactics.  Previous studies also included femoral veins due to previous history of DVTs.  If conservative therapy is not helpful with his swelling we may discuss possible venogram. 2. Primary hypertension Continue antihypertensive medications as already ordered, these medications have been reviewed and there are no  changes at this time.   3. Right leg pain Recommend:  I do not find evidence of Vascular pathology that would explain the patient's symptoms  The patient has atypical pain symptoms for vascular disease  I do not find evidence of Vascular pathology that would explain the patient's symptoms and I suspect the patient is c/o neuropathy.  The patient should undergo further work-up by his primary care physician  Noninvasive studies including venous ultrasound of the legs do not identify vascular problems that would account for his description of pain.  The patient should continue walking and begin a more formal exercise program. The patient should continue his antiplatelet therapy and aggressive treatment of the lipid abnormalities. The patient should begin wearing graduated compression socks 15-20 mmHg strength to control her mild edema. The patient was given a one-time prescription of tramadol due to pain today.   Further work-up of her lower extremity pain is deferred to the primary service       Current Outpatient Medications on File Prior to Visit  Medication Sig Dispense Refill   chlorthalidone (HYGROTON) 25 MG tablet Take 25 mg by mouth daily.     losartan (COZAAR) 50 MG tablet Take 50 mg by mouth daily.     losartan (COZAAR) 50 MG tablet Take by mouth.     oxyCODONE-acetaminophen (PERCOCET/ROXICET) 5-325 MG tablet Take 1 tablet by mouth every 4 (four) hours as needed for moderate pain. 12 tablet 0   rivaroxaban (XARELTO) 20 MG TABS tablet Take 1 tablet (20 mg total) by mouth daily with supper. New blood thinner. 30 tablet 1   No current facility-administered medications on file prior to visit.    There are no Patient Instructions on file for this visit. No follow-ups on file.   Georgiana Spinner, NP

## 2021-07-13 ENCOUNTER — Other Ambulatory Visit (INDEPENDENT_AMBULATORY_CARE_PROVIDER_SITE_OTHER): Payer: Self-pay | Admitting: Nurse Practitioner

## 2021-07-14 NOTE — Telephone Encounter (Signed)
Is this ok to fill? 

## 2021-07-27 ENCOUNTER — Telehealth (INDEPENDENT_AMBULATORY_CARE_PROVIDER_SITE_OTHER): Payer: Self-pay

## 2021-07-27 NOTE — Telephone Encounter (Signed)
Patient left a voicemail requesting refill for Tramadol 50mg . I spoke with B NP and she recommended for the patient to contact PCP for further refills. Patient was made aware with medical recommendations and verbalized understanding.

## 2021-07-29 ENCOUNTER — Encounter: Payer: Self-pay | Admitting: Nurse Practitioner

## 2021-07-29 ENCOUNTER — Other Ambulatory Visit: Payer: Self-pay

## 2021-07-29 ENCOUNTER — Ambulatory Visit (INDEPENDENT_AMBULATORY_CARE_PROVIDER_SITE_OTHER): Payer: Commercial Managed Care - PPO | Admitting: Nurse Practitioner

## 2021-07-29 VITALS — BP 139/85 | HR 73 | Temp 99.4°F | Ht 67.6 in | Wt 367.6 lb

## 2021-07-29 DIAGNOSIS — E119 Type 2 diabetes mellitus without complications: Secondary | ICD-10-CM | POA: Diagnosis not present

## 2021-07-29 DIAGNOSIS — I10 Essential (primary) hypertension: Secondary | ICD-10-CM

## 2021-07-29 DIAGNOSIS — Z6841 Body Mass Index (BMI) 40.0 and over, adult: Secondary | ICD-10-CM

## 2021-07-29 DIAGNOSIS — E7800 Pure hypercholesterolemia, unspecified: Secondary | ICD-10-CM | POA: Insufficient documentation

## 2021-07-29 DIAGNOSIS — Z7689 Persons encountering health services in other specified circumstances: Secondary | ICD-10-CM

## 2021-07-29 DIAGNOSIS — E1169 Type 2 diabetes mellitus with other specified complication: Secondary | ICD-10-CM | POA: Insufficient documentation

## 2021-07-29 DIAGNOSIS — E785 Hyperlipidemia, unspecified: Secondary | ICD-10-CM | POA: Insufficient documentation

## 2021-07-29 DIAGNOSIS — I208 Other forms of angina pectoris: Secondary | ICD-10-CM | POA: Insufficient documentation

## 2021-07-29 DIAGNOSIS — N1831 Chronic kidney disease, stage 3a: Secondary | ICD-10-CM | POA: Diagnosis not present

## 2021-07-29 NOTE — Assessment & Plan Note (Signed)
Labs ordered today. Patient states he is newly diabetic.  Was prediabetic for many years. Discussed the possibility of starting Farxiga for renal and cardiac benefits.  Will make further recommendations based on lab results.

## 2021-07-29 NOTE — Progress Notes (Signed)
BP 139/85   Pulse 73   Temp 99.4 F (37.4 C) (Oral)   Ht 5' 7.6" (1.717 m)   Wt (!) 367 lb 9.6 oz (166.7 kg)   SpO2 95%   BMI 56.56 kg/m    Subjective:    Patient ID: Ryan [name], male    DOB: Feb 28, 1971, 50 y.o.   MRN: [id number]  HPI: Ryan [name] is a 50 y.o. male  Chief Complaint  Patient presents with   Establish Care    No concerns per patient, just establishing care. States he wants to just discuss his past medical history.    Hypertension   Diabetes   Patient presents to clinic to establish care with new PCP.  Introduced to Designer, jewellery role and practice setting.  All questions answered.  Discussed provider/patient relationship and expectations.    Patient reports a history of HTN, elevated cholesterol, type 2 diabetes, CKD, Afib- had an ablation (does not see Cardiology).  Patient is seeing a Hydrographic surveyor at [facility].  He does have an IVC filter.  Vascular is causing him to have decreased blood flow to his lower extremities causing him to have recent cellulitis. He is walking daily to help with venous stasis.   Patient has had multiple blood clots. He has a blood clotting disorder.  He had a lower extremity clot removed and caused him to have renal failure.  He was on dialysis for about a year then no longer needed it.  See's Dr. Glennon Mac for Hematology at [facility].   Patient denies a history of:  Thyroid problems, Depression, Anxiety, Neurological problems, and Abdominal problems.    Denies HA, CP, SOB, dizziness, palpitations, visual changes, and lower extremity swelling.  Active Ambulatory Problems    Diagnosis Date Noted   DVT (deep venous thrombosis) (HCC) 12/28/2014   Pulmonary emboli (HCC) 11/22/2016   PVC (premature ventricular contraction) 11/24/2016   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   Chronic diastolic CHF (congestive heart failure) ([place]) 12/15/2016   HTN (hypertension) 12/15/2016   Fatigue 12/15/2016   Pulmonary embolus ([place]) 01/03/2017   Diabetes  mellitus without complication ([place]) 16/02/3709   CAP (community acquired pneumonia) 08/03/2017   CKD (chronic kidney disease), stage IIIa 08/03/2017   Hepatic steatosis 05/30/2013   Knee pain 03/11/2020   Morbid obesity with BMI of 50.0-59.9, adult ([place]) 05/30/2013   Proteinuria 05/30/2013   SOB (shortness of breath) 12/20/2016   Weakness of both lower limbs 11/17/2017   Cellulitis of right foot 06/03/2021   Angina at rest [facility]) 07/29/2021   Hypercholesterolemia 07/29/2021   Resolved Ambulatory Problems    Diagnosis Date Noted   Acute respiratory distress 11/24/2016   Obesity, Class III, BMI 40-49.9 (morbid obesity) ([place]) 11/24/2016   Past Medical History:  Diagnosis Date   CHF (congestive heart failure) (HCC)    Hypertension    Peripheral vascular disease ([place])    Presence of IVC filter    Past Surgical History:  Procedure Laterality Date   CARDIAC ELECTROPHYSIOLOGY STUDY AND ABLATION  10/2020   PERIPHERAL VASCULAR CATHETERIZATION N/A 01/10/2015   Procedure: Dialysis/Perma Catheter Insertion;  Surgeon: Katha Cabal, MD;  Location: [facility];  Service: Cardiovascular;  Laterality: N/A;   PERIPHERAL VASCULAR CATHETERIZATION N/A 02/24/2015   Procedure: Dialysis/Perma Catheter Removal;  Surgeon: Algernon Huxley, MD;  Location: [facility];  Service: Cardiovascular;  Laterality: N/A;   Family History  Problem Relation Age of Onset   Hypertension Mother    Cancer Mother  Breast cancer Mother    Prostate cancer Father    Hypertension Father    Diabetes Father      Review of Systems  Eyes:  Negative for visual disturbance.  Respiratory:  Negative for chest tightness and shortness of breath.   Cardiovascular:  Negative for chest pain, palpitations and leg swelling.  Neurological:  Negative for dizziness, light-headedness and headaches.   Per HPI unless specifically indicated above     Objective:    BP 139/85   Pulse 73   Temp 99.4 F (37.4 C)  (Oral)   Ht 5' 7.6" (1.717 m)   Wt (!) 367 lb 9.6 oz (166.7 kg)   SpO2 95%   BMI 56.56 kg/m   Wt Readings from Last 3 Encounters:  07/29/21 (!) 367 lb 9.6 oz (166.7 kg)  06/26/21 (!) 356 lb (161.5 kg)  06/06/21 (!) 356 lb 4.2 oz (161.6 kg)    Physical Exam Vitals and nursing note reviewed.  Constitutional:      General: He is not in acute distress.    Appearance: Normal appearance. He is obese. He is not ill-appearing, toxic-appearing or diaphoretic.  HENT:     Head: Normocephalic.     Right Ear: External ear normal.     Left Ear: External ear normal.     Nose: Nose normal. No congestion or rhinorrhea.     Mouth/Throat:     Mouth: Mucous membranes are moist.  Eyes:     General:        Right eye: No discharge.        Left eye: No discharge.     Extraocular Movements: Extraocular movements intact.     Conjunctiva/sclera: Conjunctivae normal.     Pupils: Pupils are equal, round, and reactive to light.  Cardiovascular:     Rate and Rhythm: Normal rate and regular rhythm.     Heart sounds: No murmur heard. Pulmonary:     Effort: Pulmonary effort is normal. No respiratory distress.     Breath sounds: Normal breath sounds. No wheezing, rhonchi or rales.  Abdominal:     General: Abdomen is flat. Bowel sounds are normal.  Musculoskeletal:     Cervical back: Normal range of motion and neck supple.  Skin:    General: Skin is warm and dry.     Capillary Refill: Capillary refill takes less than 2 seconds.  Neurological:     General: No focal deficit present.     Mental Status: He is alert and oriented to person, place, and time.  Psychiatric:        Mood and Affect: Mood normal.        Behavior: Behavior normal.        Thought Content: Thought content normal.        Judgment: Judgment normal.    Results for orders placed or performed during the hospital encounter of 06/03/21  Culture, blood (routine x 2)   Specimen: BLOOD  Result Value Ref Range   Specimen Description  BLOOD RIGHT ANTECUBITAL    Special Requests      BOTTLES DRAWN AEROBIC AND ANAEROBIC Blood Culture adequate volume   Culture      NO GROWTH 5 DAYS Performed at [facility], [address] [place], [place] [address]    Report Status 06/08/2021 FINAL   Blood culture (routine x 2)   Specimen: BLOOD  Result Value Ref Range   Specimen Description BLOOD BLOOD RIGHT FOREARM    Special Requests  BOTTLES DRAWN AEROBIC AND ANAEROBIC Blood Culture adequate volume   Culture      NO GROWTH 5 DAYS Performed at [facility], [address] [place], [place] [address]    Report Status 06/08/2021 FINAL   Resp Panel by RT-PCR (Flu A&B, Covid) Nasopharyngeal Swab   Specimen: Nasopharyngeal Swab; Nasopharyngeal(NP) swabs in vial transport medium  Result Value Ref Range   SARS Coronavirus 2 by RT PCR NEGATIVE NEGATIVE   Influenza A by PCR NEGATIVE NEGATIVE   Influenza B by PCR NEGATIVE NEGATIVE  Lactic acid, plasma  Result Value Ref Range   Lactic Acid, Venous 2.5 (HH) 0.5 - 1.9 mmol/L  Lactic acid, plasma  Result Value Ref Range   Lactic Acid, Venous 1.7 0.5 - 1.9 mmol/L  Comprehensive metabolic panel  Result Value Ref Range   Sodium 133 (L) 135 - 145 mmol/L   Potassium 4.3 3.5 - 5.1 mmol/L   Chloride 101 98 - 111 mmol/L   CO2 20 (L) 22 - 32 mmol/L   Glucose, Bld 121 (H) 70 - 99 mg/dL   BUN 17 6 - 20 mg/dL   Creatinine, Ser 1.45 (H) 0.61 - 1.24 mg/dL   Calcium 9.4 8.9 - 10.3 mg/dL   Total Protein 8.8 (H) 6.5 - 8.1 g/dL   Albumin 3.7 3.5 - 5.0 g/dL   AST 36 15 - 41 U/L   ALT 40 0 - 44 U/L   Alkaline Phosphatase 52 38 - 126 U/L   Total Bilirubin 0.8 0.3 - 1.2 mg/dL   GFR, Estimated 58 (L) >60 mL/min   Anion gap 12 5 - 15  CBC with Differential  Result Value Ref Range   WBC 4.4 4.0 - 10.5 K/uL   RBC 5.02 4.22 - 5.81 MIL/uL   Hemoglobin 15.6 13.0 - 17.0 g/dL   HCT 47.4 39.0 - 52.0 %   MCV 94.4 80.0 - 100.0 fL   MCH 31.1 26.0 - 34.0 pg   MCHC 32.9 30.0 - 36.0  g/dL   RDW 14.3 11.5 - 15.5 %   Platelets 449 (H) 150 - 400 K/uL   nRBC 0.0 0.0 - 0.2 %   Neutrophils Relative % 43 %   Neutro Abs 1.9 1.7 - 7.7 K/uL   Lymphocytes Relative 45 %   Lymphs Abs 2.0 0.7 - 4.0 K/uL   Monocytes Relative 9 %   Monocytes Absolute 0.4 0.1 - 1.0 K/uL   Eosinophils Relative 2 %   Eosinophils Absolute 0.1 0.0 - 0.5 K/uL   Basophils Relative 1 %   Basophils Absolute 0.0 0.0 - 0.1 K/uL   Immature Granulocytes 0 %   Abs Immature Granulocytes 0.01 0.00 - 0.07 K/uL  Urinalysis, Complete w Microscopic  Result Value Ref Range   Color, Urine YELLOW (A) YELLOW   APPearance CLEAR (A) CLEAR   Specific Gravity, Urine 1.030 1.005 - 1.030   pH 5.0 5.0 - 8.0   Glucose, UA NEGATIVE NEGATIVE mg/dL   Hgb urine dipstick NEGATIVE NEGATIVE   Bilirubin Urine NEGATIVE NEGATIVE   Ketones, ur NEGATIVE NEGATIVE mg/dL   Protein, ur 100 (A) NEGATIVE mg/dL   Nitrite NEGATIVE NEGATIVE   Leukocytes,Ua NEGATIVE NEGATIVE   RBC / HPF 0-5 0 - 5 RBC/hpf   WBC, UA 0-5 0 - 5 WBC/hpf   Bacteria, UA NONE SEEN NONE SEEN   Squamous Epithelial / LPF 0-5 0 - 5   Mucus PRESENT    Hyaline Casts, UA PRESENT   Sedimentation rate  Result  Value Ref Range   Sed Rate 6 0 - 20 mm/hr  C-reactive protein  Result Value Ref Range   CRP 0.7 <1.0 mg/dL  Brain natriuretic peptide  Result Value Ref Range   B Natriuretic Peptide 8.1 0.0 - 100.0 pg/mL  Protime-INR  Result Value Ref Range   Prothrombin Time 21.5 (H) 11.4 - 15.2 seconds   INR 1.9 (H) 0.8 - 1.2  APTT  Result Value Ref Range   aPTT 35 24 - 36 seconds  Procalcitonin  Result Value Ref Range   Procalcitonin <0.10 ng/mL  Basic metabolic panel  Result Value Ref Range   Sodium 135 135 - 145 mmol/L   Potassium 4.5 3.5 - 5.1 mmol/L   Chloride 106 98 - 111 mmol/L   CO2 25 22 - 32 mmol/L   Glucose, Bld 112 (H) 70 - 99 mg/dL   BUN 18 6 - 20 mg/dL   Creatinine, Ser 1.29 (H) 0.61 - 1.24 mg/dL   Calcium 8.7 (L) 8.9 - 10.3 mg/dL   GFR, Estimated  >60 >60 mL/min   Anion gap 4 (L) 5 - 15  CBC  Result Value Ref Range   WBC 5.6 4.0 - 10.5 K/uL   RBC 4.42 4.22 - 5.81 MIL/uL   Hemoglobin 14.0 13.0 - 17.0 g/dL   HCT 42.1 39.0 - 52.0 %   MCV 95.2 80.0 - 100.0 fL   MCH 31.7 26.0 - 34.0 pg   MCHC 33.3 30.0 - 36.0 g/dL   RDW 14.4 11.5 - 15.5 %   Platelets 402 (H) 150 - 400 K/uL   nRBC 0.0 0.0 - 0.2 %  HIV Antibody (routine testing w rflx)  Result Value Ref Range   HIV Screen 4th Generation wRfx Non Reactive Non Reactive  CBC with Differential/Platelet  Result Value Ref Range   WBC 3.7 (L) 4.0 - 10.5 K/uL   RBC 4.28 4.22 - 5.81 MIL/uL   Hemoglobin 13.2 13.0 - 17.0 g/dL   HCT 40.2 39.0 - 52.0 %   MCV 93.9 80.0 - 100.0 fL   MCH 30.8 26.0 - 34.0 pg   MCHC 32.8 30.0 - 36.0 g/dL   RDW 14.5 11.5 - 15.5 %   Platelets 347 150 - 400 K/uL   nRBC 0.0 0.0 - 0.2 %   Neutrophils Relative % 44 %   Neutro Abs 1.6 (L) 1.7 - 7.7 K/uL   Lymphocytes Relative 37 %   Lymphs Abs 1.4 0.7 - 4.0 K/uL   Monocytes Relative 14 %   Monocytes Absolute 0.5 0.1 - 1.0 K/uL   Eosinophils Relative 4 %   Eosinophils Absolute 0.2 0.0 - 0.5 K/uL   Basophils Relative 1 %   Basophils Absolute 0.0 0.0 - 0.1 K/uL   Immature Granulocytes 0 %   Abs Immature Granulocytes 0.00 0.00 - 0.07 K/uL  Comprehensive metabolic panel  Result Value Ref Range   Sodium 134 (L) 135 - 145 mmol/L   Potassium 4.2 3.5 - 5.1 mmol/L   Chloride 105 98 - 111 mmol/L   CO2 22 22 - 32 mmol/L   Glucose, Bld 108 (H) 70 - 99 mg/dL   BUN 17 6 - 20 mg/dL   Creatinine, Ser 1.12 0.61 - 1.24 mg/dL   Calcium 8.7 (L) 8.9 - 10.3 mg/dL   Total Protein 7.6 6.5 - 8.1 g/dL   Albumin 3.3 (L) 3.5 - 5.0 g/dL   AST 32 15 - 41 U/L   ALT 41 0 - 44 U/L  Alkaline Phosphatase 49 38 - 126 U/L   Total Bilirubin 0.9 0.3 - 1.2 mg/dL   GFR, Estimated >60 >60 mL/min   Anion gap 7 5 - 15  Phosphorus  Result Value Ref Range   Phosphorus 3.7 2.5 - 4.6 mg/dL  Magnesium  Result Value Ref Range   Magnesium 1.7  1.7 - 2.4 mg/dL  Hemoglobin A1c  Result Value Ref Range   Hgb A1c MFr Bld 6.6 (H) 4.8 - 5.6 %   Mean Plasma Glucose 143 mg/dL  Hemoglobin A1c  Result Value Ref Range   Hgb A1c MFr Bld 6.6 (H) 4.8 - 5.6 %   Mean Plasma Glucose 143 mg/dL  CBC with Differential/Platelet  Result Value Ref Range   WBC 4.1 4.0 - 10.5 K/uL   RBC 4.21 (L) 4.22 - 5.81 MIL/uL   Hemoglobin 13.0 13.0 - 17.0 g/dL   HCT 40.0 39.0 - 52.0 %   MCV 95.0 80.0 - 100.0 fL   MCH 30.9 26.0 - 34.0 pg   MCHC 32.5 30.0 - 36.0 g/dL   RDW 14.5 11.5 - 15.5 %   Platelets 348 150 - 400 K/uL   nRBC 0.0 0.0 - 0.2 %   Neutrophils Relative % 43 %   Neutro Abs 1.8 1.7 - 7.7 K/uL   Lymphocytes Relative 34 %   Lymphs Abs 1.4 0.7 - 4.0 K/uL   Monocytes Relative 17 %   Monocytes Absolute 0.7 0.1 - 1.0 K/uL   Eosinophils Relative 5 %   Eosinophils Absolute 0.2 0.0 - 0.5 K/uL   Basophils Relative 1 %   Basophils Absolute 0.0 0.0 - 0.1 K/uL   Immature Granulocytes 0 %   Abs Immature Granulocytes 0.01 0.00 - 0.07 K/uL  Comprehensive metabolic panel  Result Value Ref Range   Sodium 135 135 - 145 mmol/L   Potassium 4.5 3.5 - 5.1 mmol/L   Chloride 102 98 - 111 mmol/L   CO2 24 22 - 32 mmol/L   Glucose, Bld 118 (H) 70 - 99 mg/dL   BUN 18 6 - 20 mg/dL   Creatinine, Ser 1.38 (H) 0.61 - 1.24 mg/dL   Calcium 8.8 (L) 8.9 - 10.3 mg/dL   Total Protein 8.0 6.5 - 8.1 g/dL   Albumin 3.2 (L) 3.5 - 5.0 g/dL   AST 29 15 - 41 U/L   ALT 39 0 - 44 U/L   Alkaline Phosphatase 47 38 - 126 U/L   Total Bilirubin 0.7 0.3 - 1.2 mg/dL   GFR, Estimated >60 >60 mL/min   Anion gap 9 5 - 15  Magnesium  Result Value Ref Range   Magnesium 1.8 1.7 - 2.4 mg/dL  Phosphorus  Result Value Ref Range   Phosphorus 3.8 2.5 - 4.6 mg/dL  Vancomycin, peak  Result Value Ref Range   Vancomycin Pk 27 (L) 30 - 40 ug/mL  Vancomycin, trough  Result Value Ref Range   Vancomycin Tr 14 (L) 15 - 20 ug/mL      Assessment & Plan:   Problem List Items Addressed This  Visit       Cardiovascular and Mediastinum   HTN (hypertension) (Chronic)    Chronic.  Controlled.  Continue with current medication regimen on losartan and Metoprolol.  Labs ordered today.  Return to clinic in 3 months for reevaluation.  Call sooner if concerns arise.        Relevant Medications   metoprolol tartrate (LOPRESSOR) 25 MG tablet   Other Relevant Orders   Comp  Met (CMET)     Endocrine   Diabetes mellitus without complication ([place])    Labs ordered today. Patient states he is newly diabetic.  Was prediabetic for many years. Discussed the possibility of starting [place] for renal and cardiac benefits.  Will make further recommendations based on lab results.         Genitourinary   CKD (chronic kidney disease), stage IIIa - Primary    Discussed lab results over the last year with patient during visit.  Will check labs at visit today.  Discussed following up with Nephrology. Patient agrees to make appt. Follow up in 3 months for reevaluation.         Other   Morbid obesity with BMI of 50.0-59.9, adult (HCC)    Chronic.  Recommend a low calorie and low fat diet.  Several smaller meals through out the day that prioritize protein.        Hypercholesterolemia    Labs ordered today. Will make recommendations based on lab results.       Relevant Medications   metoprolol tartrate (LOPRESSOR) 25 MG tablet   Other Relevant Orders   Lipid Profile   RESOLVED: Obesity, Class III, BMI 40-49.9 (morbid obesity) ([place])   Other Visit Diagnoses     Encounter to establish care            Follow up plan: Return in about 3 months (around 10/27/2021) for HTN, HLD, DM2 FU.

## 2021-07-29 NOTE — Assessment & Plan Note (Signed)
Chronic.  Controlled.  Continue with current medication regimen on losartan and Metoprolol.  Labs ordered today.  Return to clinic in 3 months for reevaluation.  Call sooner if concerns arise.

## 2021-07-29 NOTE — Assessment & Plan Note (Signed)
Chronic.  Recommend a low calorie and low fat diet.  Several smaller meals through out the day that prioritize protein.

## 2021-07-29 NOTE — Assessment & Plan Note (Signed)
Labs ordered today.  Will make recommendations based on lab results. ?

## 2021-07-29 NOTE — Assessment & Plan Note (Signed)
Discussed lab results over the last year with patient during visit.  Will check labs at visit today.  Discussed following up with Nephrology. Patient agrees to make appt. Follow up in 3 months for reevaluation.

## 2021-07-30 LAB — LIPID PANEL
Chol/HDL Ratio: 4.8 ratio (ref 0.0–5.0)
Cholesterol, Total: 197 mg/dL (ref 100–199)
HDL: 41 mg/dL (ref >39)
LDL Chol Calc (NIH): 139 mg/dL — ABNORMAL HIGH (ref 0–99)
Triglycerides: 94 mg/dL (ref 0–149)
VLDL Cholesterol Cal: 17 mg/dL (ref 5–40)

## 2021-07-30 LAB — COMPREHENSIVE METABOLIC PANEL
ALT: 33 IU/L (ref 0–44)
AST: 23 IU/L (ref 0–40)
Albumin/Globulin Ratio: 1.3 (ref 1.2–2.2)
Albumin: 4.2 g/dL (ref 3.8–4.9)
Alkaline Phosphatase: 68 IU/L (ref 44–121)
BUN/Creatinine Ratio: 9 (ref 9–20)
BUN: 13 mg/dL (ref 6–24)
Bilirubin Total: 0.4 mg/dL (ref 0.0–1.2)
CO2: 20 mmol/L (ref 20–29)
Calcium: 9 mg/dL (ref 8.7–10.2)
Chloride: 104 mmol/L (ref 96–106)
Creatinine, Ser: 1.43 mg/dL — ABNORMAL HIGH (ref 0.76–1.27)
Globulin, Total: 3.2 g/dL (ref 1.5–4.5)
Glucose: 92 mg/dL (ref 70–99)
Potassium: 4.6 mmol/L (ref 3.5–5.2)
Sodium: 140 mmol/L (ref 134–144)
Total Protein: 7.4 g/dL (ref 6.0–8.5)
eGFR: 59 mL/min/{1.73_m2} — ABNORMAL LOW (ref >59)

## 2021-07-30 LAB — HEMOGLOBIN A1C
Est. average glucose Bld gHb Est-mCnc: 134 mg/dL
Hgb A1c MFr Bld: 6.3 % — ABNORMAL HIGH (ref 4.8–5.6)

## 2021-07-30 NOTE — Progress Notes (Signed)
Hi Dent. Overall your lab work looks good.  Your kidney function shows that it is consistent with Stage 3 chronic kidney disease as we discussed during your visit. Cholesterol is slightly elevated.  Your cholesterol is elevated.  Your cardiac risk score puts you in the high risk of having a stroke or heart attack over the next 10 years.  I recommend that you start a statin called crestor 5mg  daily.  The goal will be to increase this to 20mg  daily if your tolerate it well.  If you agree to the medication I can send it to the pharmacy.  Your cardiac risk score is calculated below.  Your diabetes is well controlled.  I do not think we should start medication at this time.  We will continue to monitor this at future visits.  The 10-year ASCVD risk score (Arnett DK, et al., 2019) is: 20.4%   Values used to calculate the score:     Age: 50 years     Sex: Male     Is Non-Hispanic African American: Yes     Diabetic: Yes     Tobacco smoker: No     Systolic Blood Pressure: 139 mmHg     Is BP treated: Yes     HDL Cholesterol: 41 mg/dL     Total Cholesterol: 197 mg/dL

## 2021-09-28 ENCOUNTER — Ambulatory Visit (INDEPENDENT_AMBULATORY_CARE_PROVIDER_SITE_OTHER): Payer: Commercial Managed Care - PPO | Admitting: Nurse Practitioner

## 2021-10-19 DIAGNOSIS — D689 Coagulation defect, unspecified: Secondary | ICD-10-CM | POA: Insufficient documentation

## 2021-10-29 ENCOUNTER — Encounter: Payer: Self-pay | Admitting: Nurse Practitioner

## 2021-10-29 ENCOUNTER — Other Ambulatory Visit: Payer: Self-pay

## 2021-10-29 ENCOUNTER — Ambulatory Visit (INDEPENDENT_AMBULATORY_CARE_PROVIDER_SITE_OTHER): Payer: No Typology Code available for payment source | Admitting: Nurse Practitioner

## 2021-10-29 VITALS — BP 124/80 | HR 88 | Temp 97.9°F | Wt 359.6 lb

## 2021-10-29 DIAGNOSIS — E119 Type 2 diabetes mellitus without complications: Secondary | ICD-10-CM

## 2021-10-29 DIAGNOSIS — I5032 Chronic diastolic (congestive) heart failure: Secondary | ICD-10-CM | POA: Diagnosis not present

## 2021-10-29 DIAGNOSIS — I10 Essential (primary) hypertension: Secondary | ICD-10-CM

## 2021-10-29 DIAGNOSIS — N1831 Chronic kidney disease, stage 3a: Secondary | ICD-10-CM

## 2021-10-29 DIAGNOSIS — I825Z3 Chronic embolism and thrombosis of unspecified deep veins of distal lower extremity, bilateral: Secondary | ICD-10-CM

## 2021-10-29 DIAGNOSIS — E7800 Pure hypercholesterolemia, unspecified: Secondary | ICD-10-CM

## 2021-10-29 DIAGNOSIS — Z1211 Encounter for screening for malignant neoplasm of colon: Secondary | ICD-10-CM

## 2021-10-29 MED ORDER — DAPAGLIFLOZIN PROPANEDIOL 10 MG PO TABS: 10.0000 mg | ORAL_TABLET | Freq: Every day | ORAL | 1 refills | Status: DC

## 2021-10-29 NOTE — Assessment & Plan Note (Signed)
Chronic.  Controlled.  Continue with current medication regimen.  Labs ordered today.  Return to clinic in 3 months for reevaluation.  Call sooner if concerns arise.   

## 2021-10-29 NOTE — Assessment & Plan Note (Signed)
Chronic.  Encouraged to make follow up with Cardiology.  Will start Farxiga to help with HF and DM. Side effects and benefits of medication discussed with patient during visit.  Follow up 3 months for reevaluation. ?

## 2021-10-29 NOTE — Assessment & Plan Note (Signed)
Chronic.  Controlled with new regimen.  Creatinine bumped up with starting Chlorthalidine.  Medication was changed to Losartan before discharge.  Continue with current medication regimen.  Labs ordered today.  Will make recommendations based on lab results.  Return to clinic in 3 months for reevaluation.  Call sooner if concerns arise.  ? ?

## 2021-10-29 NOTE — Assessment & Plan Note (Signed)
Chronic.  Will start [place] for kidney protection.  Follow up in 3 months for repeat labs.  ?

## 2021-10-29 NOTE — Progress Notes (Signed)
BP 124/80 (BP Location: Right Arm, Cuff Size: Large)    Pulse 88    Temp 97.9 F (36.6 C) (Oral)    Wt (!) 359 lb 9.6 oz (163.1 kg)    SpO2 93%    BMI 55.33 kg/m    Subjective:    Patient ID: Ryan [name], male    DOB: 01-09-1971, 51 y.o.   MRN: [id number]  HPI: Ryan [name] is a 51 y.o. male  Chief Complaint  Patient presents with   Hospitalization Follow-up   Diabetes   Hyperlipidemia   Patient presents to clinic today for follow up after hospitalization.  Is feeling a lot better now.  Patient has an appointment with Hematology- not sure of the date.  Does not have an appointment with Cardiology.  Has a follow up with vascular.  He is having surgery in April to remove filters.   Denies HA, CP, SOB, dizziness, palpitations, visual changes, and lower extremity swelling.   Transition of Care Hospital Follow up.   Hospital/Facility: [facility] D/C Physician: Dr. 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 CTA chest demonstrated acute on chronic pulmonary emboli extending from primary pulmonary arteries into the majority of the branches bilaterally with significant clot burden superior to the suprarenal IVC filters. Has e/o right heart strain with rising troponin, +McConnell's sign on TTE, and S1Q3T3 on EKG (from 10/21/21). Characterized as PESI class III, intermediate risk (PESI score 91). As far as etiology of PE, BMI is a risk factor as well as sub-optimal AC compliance 2/2 insurance issues, and possible underlying clotting disorder. 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 Of note, follows with Hematology and hypercoagulable work-up has been negative thus far. S/p catheter-directed  lytics (EKOS). Vascular surgery consulted; planning for caval reconstruction next month and will need outpatient follow-up. On discharge, he was saturating well on RA and did not need oxygen on 6MWT.   Troponin Elevation   Type 2 Myocardial Infarction Troponin is rising from 257>814. Suspect type II MI in the setting of acute submassive PE with right heart strain (+McConnell's sign on TTE, S1Q3T3 on EKG). On discharge he did not have further chest pain.   H/o HFmrEF, recovered Most recent TTE 09/2020 with EF 45-50%. Prior notes do not mention suspected etiology. Repeat TTE on 2/21 c/w RV strain with mod RA/RV dilation, moderately reduced Rv systolic function, + McConnell's sign, though EF appears to be recovered at 55-60%.  Hypertension Had SBPs >200, which improved with addition of spironolactone and entresto. Treating with home chlorthalidone, started entresto, labetalol as below. Holding home losartan.   H/o Afib/flutter s/p Ablation in 11/2020 Noted to have coarse atrial fibrillation during a hospitalization at [facility] in 09/2020 and was found to have atrial flutter with RVR at a Cardiology clinic visit on 10/30/20. Was asymptomatic with the tachyarrhythmias, but given of e/o LV dysfunction (HFmrEF with EF 45-50%), an afib/aflutter ablation was pursued.   AKI vs CKD3 Previous notes report baseline Cr of 1.5 but patient's Cr as low as 0.99 inpatient. Cr rose to 1.43 after initiation of Chlorthalidone and Spironolactone. Chlorthalidone was stopped and replaced with losartan on discharge. Labs should be checked after  discharge to follow-up potential AKI vs baseline CKD.   Date of interactive Contact within 48 hours of discharge:  Contact was through: phone  Date of 7 day or 14 day face-to-face visit:    within 14 days  Outpatient Encounter Medications as of 10/29/2021  Medication Sig   amLODipine (NORVASC) 5 MG tablet Take 5 mg by mouth daily.   dapagliflozin propanediol (FARXIGA) 10 MG  TABS tablet Take 1 tablet (10 mg total) by mouth daily before breakfast.   losartan (COZAAR) 50 MG tablet Take 50 mg by mouth daily.   magnesium oxide (MAG-OX) 400 MG tablet Take 1 tablet by mouth in the morning and at bedtime.   Rivaroxaban (XARELTO) 15 MG TABS tablet Take 1 tablet by mouth in the morning and at bedtime.   spironolactone (ALDACTONE) 25 MG tablet Take 25 mg by mouth daily.   [DISCONTINUED] metoprolol tartrate (LOPRESSOR) 25 MG tablet Take 25 mg by mouth daily.   [DISCONTINUED] rivaroxaban (XARELTO) 20 MG TABS tablet Take 1 tablet (20 mg total) by mouth daily with supper. New blood thinner.   No facility-administered encounter medications on file as of 10/29/2021.    Diagnostic Tests Reviewed/Disposition: Reviewed  Consults: Cardiology, Hematology, Vascular  Discharge Instructions: Reviewed  Disease/illness Education: Provided during visit  Home Health/Community Services Discussions/Referrals: NA  Establishment or re-establishment of referral orders for community resources: Reviewed follow ups with specialists  Discussion with other health care providers: None  Assessment and Support of treatment regimen adherence: Provided  Appointments Coordinated with: Patient  Education for self-management, independent living, and ADLs: Provided and Discussed during visit  Relevant past medical, surgical, family and social history reviewed and updated as indicated. Interim medical history since our last visit reviewed. Allergies and medications reviewed and updated.  Review of Systems  Eyes:  Negative for visual disturbance.  Respiratory:  Negative for chest tightness and shortness of breath.   Cardiovascular:  Negative for chest pain, palpitations and leg swelling.  Neurological:  Negative for dizziness, light-headedness and headaches.   Per HPI unless specifically indicated above     Objective:    BP 124/80 (BP Location: Right Arm, Cuff Size: Large)    Pulse 88    Temp  97.9 F (36.6 C) (Oral)    Wt (!) 359 lb 9.6 oz (163.1 kg)    SpO2 93%    BMI 55.33 kg/m   Wt Readings from Last 3 Encounters:  10/29/21 (!) 359 lb 9.6 oz (163.1 kg)  07/29/21 (!) 367 lb 9.6 oz (166.7 kg)  06/26/21 (!) 356 lb (161.5 kg)    Physical Exam Vitals and nursing note reviewed.  Constitutional:      General: He is not in acute distress.    Appearance: Normal appearance. He is obese. He is not ill-appearing, toxic-appearing or diaphoretic.  HENT:     Head: Normocephalic.     Right Ear: External ear normal.     Left Ear: External ear normal.     Nose: Nose normal. No congestion or rhinorrhea.     Mouth/Throat:     Mouth: Mucous membranes are moist.  Eyes:     General:        Right eye: No discharge.        Left eye: No discharge.     Extraocular Movements: Extraocular movements intact.     Conjunctiva/sclera: Conjunctivae normal.     Pupils: Pupils are equal, round, and reactive to light.  Cardiovascular:     Rate and Rhythm: Normal  rate and regular rhythm.     Heart sounds: No murmur heard. Pulmonary:     Effort: Pulmonary effort is normal. No respiratory distress.     Breath sounds: Normal breath sounds. No wheezing, rhonchi or rales.  Abdominal:     General: Abdomen is flat. Bowel sounds are normal.  Musculoskeletal:     Cervical back: Normal range of motion and neck supple.  Skin:    General: Skin is warm and dry.     Capillary Refill: Capillary refill takes less than 2 seconds.  Neurological:     General: No focal deficit present.     Mental Status: He is alert and oriented to person, place, and time.  Psychiatric:        Mood and Affect: Mood normal.        Behavior: Behavior normal.        Thought Content: Thought content normal.        Judgment: Judgment normal.    Results for orders placed or performed in visit on 07/29/21  Comp Met (CMET)  Result Value Ref Range   Glucose 92 70 - 99 mg/dL   BUN 13 6 - 24 mg/dL   Creatinine, Ser 1.43 (H) 0.76 -  1.27 mg/dL   eGFR 59 (L) >59 mL/min/1.73   BUN/Creatinine Ratio 9 9 - 20   Sodium 140 134 - 144 mmol/L   Potassium 4.6 3.5 - 5.2 mmol/L   Chloride 104 96 - 106 mmol/L   CO2 20 20 - 29 mmol/L   Calcium 9.0 8.7 - 10.2 mg/dL   Total Protein 7.4 6.0 - 8.5 g/dL   Albumin 4.2 3.8 - 4.9 g/dL   Globulin, Total 3.2 1.5 - 4.5 g/dL   Albumin/Globulin Ratio 1.3 1.2 - 2.2   Bilirubin Total 0.4 0.0 - 1.2 mg/dL   Alkaline Phosphatase 68 44 - 121 IU/L   AST 23 0 - 40 IU/L   ALT 33 0 - 44 IU/L  Lipid Profile  Result Value Ref Range   Cholesterol, Total 197 100 - 199 mg/dL   Triglycerides 94 0 - 149 mg/dL   HDL 41 >39 mg/dL   VLDL Cholesterol Cal 17 5 - 40 mg/dL   LDL Chol Calc (NIH) 139 (H) 0 - 99 mg/dL   Chol/HDL Ratio 4.8 0.0 - 5.0 ratio  HgB A1c  Result Value Ref Range   Hgb A1c MFr Bld 6.3 (H) 4.8 - 5.6 %   Est. average glucose Bld gHb Est-mCnc 134 mg/dL      Assessment & Plan:   Problem List Items Addressed This Visit       Cardiovascular and Mediastinum   HTN (hypertension) - Primary (Chronic)    Chronic.  Controlled with new regimen.  Creatinine bumped up with starting Chlorthalidine.  Medication was changed to Losartan before discharge.  Continue with current medication regimen.  Labs ordered today.  Will make recommendations based on lab results.  Return to clinic in 3 months for reevaluation.  Call sooner if concerns arise.        Relevant Medications   amLODipine (NORVASC) 5 MG tablet   Rivaroxaban (XARELTO) 15 MG TABS tablet   spironolactone (ALDACTONE) 25 MG tablet   Other Relevant Orders   Comp Met (CMET)   DVT (deep venous thrombosis) (HCC)    Stopped taking Xarelto prior to hospitalization.  Discussed continuing medication.  Has appointment with Hematology coming up.  Encouraged to keep appt. Labs ordered today. Follow up in 3 months.  Call sooner if concerns arise.        Relevant Medications   amLODipine (NORVASC) 5 MG tablet   Rivaroxaban (XARELTO) 15 MG TABS  tablet   spironolactone (ALDACTONE) 25 MG tablet   Other Relevant Orders   CBC w/Diff   Chronic diastolic CHF (congestive heart failure) (HCC)    Chronic.  Encouraged to make follow up with Cardiology.  Will start Farxiga to help with HF and DM. Side effects and benefits of medication discussed with patient during visit.  Follow up 3 months for reevaluation.      Relevant Medications   amLODipine (NORVASC) 5 MG tablet   Rivaroxaban (XARELTO) 15 MG TABS tablet   spironolactone (ALDACTONE) 25 MG tablet   Other Relevant Orders   Comp Met (CMET)     Endocrine   Diabetes mellitus without complication (HCC)    Chronic.  Controlled.  Continue with current medication regimen.  Will start [place] for kidney and heart protection.  Labs ordered today.  Return to clinic in 3 months for reevaluation.  Call sooner if concerns arise.        Relevant Medications   dapagliflozin propanediol (FARXIGA) 10 MG TABS tablet   Other Relevant Orders   HgB A1c     Genitourinary   CKD (chronic kidney disease), stage IIIa    Chronic.  Will start [place] for kidney protection.  Follow up in 3 months for repeat labs.       Relevant Orders   CBC w/Diff     Other   Hypercholesterolemia    Chronic.  Controlled.  Continue with current medication regimen.  Labs ordered today.  Return to clinic in 3 months for reevaluation.  Call sooner if concerns arise.        Relevant Medications   amLODipine (NORVASC) 5 MG tablet   Rivaroxaban (XARELTO) 15 MG TABS tablet   spironolactone (ALDACTONE) 25 MG tablet   Other Relevant Orders   Lipid Profile   Other Visit Diagnoses     Screening for colon cancer       Relevant Orders   Cologuard        Follow up plan: Return in about 3 months (around 01/29/2022) for HTN, HLD, DM2 FU.

## 2021-10-29 NOTE — Assessment & Plan Note (Signed)
Chronic.  Controlled.  Continue with current medication regimen.  Will start Farxiga for kidney and heart protection.  Labs ordered today.  Return to clinic in 3 months for reevaluation.  Call sooner if concerns arise.  ? ?

## 2021-10-29 NOTE — Assessment & Plan Note (Signed)
Stopped taking Xarelto prior to hospitalization.  Discussed continuing medication.  Has appointment with Hematology coming up.  Encouraged to keep appt. Labs ordered today. Follow up in 3 months.  Call sooner if concerns arise.   ?

## 2021-10-30 LAB — CBC WITH DIFFERENTIAL/PLATELET
Basophils Absolute: 0.1 10*3/uL (ref 0.0–0.2)
Basos: 2 %
EOS (ABSOLUTE): 0.2 10*3/uL (ref 0.0–0.4)
Eos: 6 %
Hematocrit: 50 % (ref 37.5–51.0)
Hemoglobin: 16.5 g/dL (ref 13.0–17.7)
Immature Grans (Abs): 0 10*3/uL (ref 0.0–0.1)
Immature Granulocytes: 0 %
Lymphocytes Absolute: 1.5 10*3/uL (ref 0.7–3.1)
Lymphs: 41 %
MCH: 30.3 pg (ref 26.6–33.0)
MCHC: 33 g/dL (ref 31.5–35.7)
MCV: 92 fL (ref 79–97)
Monocytes Absolute: 0.5 10*3/uL (ref 0.1–0.9)
Monocytes: 15 %
Neutrophils Absolute: 1.3 10*3/uL — ABNORMAL LOW (ref 1.4–7.0)
Neutrophils: 36 %
Platelets: 358 10*3/uL (ref 150–450)
RBC: 5.44 x10E6/uL (ref 4.14–5.80)
RDW: 12.9 % (ref 11.6–15.4)
WBC: 3.6 10*3/uL (ref 3.4–10.8)

## 2021-10-30 LAB — LIPID PANEL
Chol/HDL Ratio: 4.5 ratio (ref 0.0–5.0)
Cholesterol, Total: 198 mg/dL (ref 100–199)
HDL: 44 mg/dL (ref >39)
LDL Chol Calc (NIH): 134 mg/dL — ABNORMAL HIGH (ref 0–99)
Triglycerides: 109 mg/dL (ref 0–149)
VLDL Cholesterol Cal: 20 mg/dL (ref 5–40)

## 2021-10-30 LAB — COMPREHENSIVE METABOLIC PANEL
ALT: 35 IU/L (ref 0–44)
AST: 24 IU/L (ref 0–40)
Albumin/Globulin Ratio: 1.3 (ref 1.2–2.2)
Albumin: 4.5 g/dL (ref 3.8–4.9)
Alkaline Phosphatase: 77 IU/L (ref 44–121)
BUN/Creatinine Ratio: 11 (ref 9–20)
BUN: 18 mg/dL (ref 6–24)
Bilirubin Total: 0.5 mg/dL (ref 0.0–1.2)
CO2: 19 mmol/L — ABNORMAL LOW (ref 20–29)
Calcium: 9.5 mg/dL (ref 8.7–10.2)
Chloride: 102 mmol/L (ref 96–106)
Creatinine, Ser: 1.57 mg/dL — ABNORMAL HIGH (ref 0.76–1.27)
Globulin, Total: 3.5 g/dL (ref 1.5–4.5)
Glucose: 100 mg/dL — ABNORMAL HIGH (ref 70–99)
Potassium: 5.1 mmol/L (ref 3.5–5.2)
Sodium: 137 mmol/L (ref 134–144)
Total Protein: 8 g/dL (ref 6.0–8.5)
eGFR: 53 mL/min/{1.73_m2} — ABNORMAL LOW (ref >59)

## 2021-10-30 LAB — HEMOGLOBIN A1C
Est. average glucose Bld gHb Est-mCnc: 140 mg/dL
Hgb A1c MFr Bld: 6.5 % — ABNORMAL HIGH (ref 4.8–5.6)

## 2021-10-30 NOTE — Addendum Note (Signed)
Addended by: Jon Billings on: 10/30/2021 02:18 PM ? ? Modules accepted: Orders ? ?

## 2021-10-30 NOTE — Progress Notes (Signed)
Hi Ryan [name].  Your lab work shows that your creatinine increased slightly from discharge.  I would like you to come back in a month and repeat the kidney function testing.  Otherwise, your lab work looks good.  Please let me know if you have any questions.

## 2021-11-02 ENCOUNTER — Telehealth: Payer: Self-pay | Admitting: Nurse Practitioner

## 2021-11-02 NOTE — Telephone Encounter (Signed)
Ryan [name] dropped off FMLA palerwork to be completed by the provider. Paper work was placed in the providers folder to be completed. Please contact pt when forms are ready to be picked up. ?

## 2021-11-03 NOTE — Telephone Encounter (Signed)
Spoke with patient to notify him that his paperwork was ready for pick up. Patient is asking if his completed paperwork could be faxed over. Patient was advised we would fax over the completed paperwork.  ?

## 2021-11-03 NOTE — Telephone Encounter (Signed)
Paperwork faxed to [employer] as requested.

## 2021-11-16 ENCOUNTER — Encounter: Payer: Self-pay | Admitting: Nurse Practitioner

## 2021-11-16 NOTE — Telephone Encounter (Signed)
Please print out FMLA paperwork that was already completed and update information on frequency. ?

## 2021-12-08 ENCOUNTER — Telehealth: Payer: Self-pay | Admitting: Nurse Practitioner

## 2021-12-08 NOTE — Telephone Encounter (Signed)
Copied from CRM [id number]. Topic: Quick Communication - Rx Refill/Question ?>> Dec 08, 2021 10:29 AM Pawlus, Ryan [name] wrote: ?Pt called in to follow up on a PA required for dapagliflozin propanediol (FARXIGA) 10 MG TABS tablet, please advise. ?

## 2021-12-08 NOTE — Telephone Encounter (Signed)
Called and submitted PA with insurance over the phone. PA number is [id number]. Awaiting determination. Preferred alternative is Jardiance 25 mg.  ?

## 2021-12-09 ENCOUNTER — Telehealth: Payer: Self-pay

## 2021-12-09 MED ORDER — EMPAGLIFLOZIN 25 MG PO TABS: 25.0000 mg | ORAL_TABLET | Freq: Every day | ORAL | 1 refills | Status: DC

## 2021-12-09 NOTE — Addendum Note (Signed)
Addended by: Dorcas Carrow on: 12/09/2021 09:25 AM ? ? Modules accepted: Orders ? ?

## 2021-12-09 NOTE — Telephone Encounter (Signed)
Pt notified of PA farxiga being denied. Notified pt of alternative Jardiance being sent through to his pharmacy. Pt voiced understanding.  ?

## 2021-12-09 NOTE — Telephone Encounter (Signed)
PA denied, would jardiance be an appropriate alternative?  ?

## 2021-12-09 NOTE — Telephone Encounter (Signed)
Received denial notice for Farxiga. Would jardiance be an appropriate alternative?  ?

## 2021-12-09 NOTE — Telephone Encounter (Signed)
Ryan [name] has been sent through ?

## 2021-12-30 LAB — HM DIABETES EYE EXAM

## 2021-12-31 ENCOUNTER — Observation Stay: Payer: No Typology Code available for payment source

## 2021-12-31 ENCOUNTER — Emergency Department: Payer: No Typology Code available for payment source

## 2021-12-31 ENCOUNTER — Inpatient Hospital Stay
Admission: EM | Admit: 2021-12-31 | Discharge: 2022-01-04 | DRG: 871 | Disposition: A | Discharge status: Home / Self Care | Payer: No Typology Code available for payment source | Attending: Internal Medicine | Admitting: Internal Medicine

## 2021-12-31 ENCOUNTER — Other Ambulatory Visit: Payer: Self-pay

## 2021-12-31 DIAGNOSIS — Z86711 Personal history of pulmonary embolism: Secondary | ICD-10-CM | POA: Diagnosis present

## 2021-12-31 DIAGNOSIS — R0602 Shortness of breath: Secondary | ICD-10-CM

## 2021-12-31 DIAGNOSIS — E872 Acidosis, unspecified: Secondary | ICD-10-CM | POA: Diagnosis present

## 2021-12-31 DIAGNOSIS — I4891 Unspecified atrial fibrillation: Secondary | ICD-10-CM | POA: Diagnosis present

## 2021-12-31 DIAGNOSIS — O2230 Deep phlebothrombosis in pregnancy, unspecified trimester: Secondary | ICD-10-CM

## 2021-12-31 DIAGNOSIS — Z7901 Long term (current) use of anticoagulants: Secondary | ICD-10-CM

## 2021-12-31 DIAGNOSIS — I5031 Acute diastolic (congestive) heart failure: Secondary | ICD-10-CM | POA: Diagnosis present

## 2021-12-31 DIAGNOSIS — K760 Fatty (change of) liver, not elsewhere classified: Secondary | ICD-10-CM | POA: Diagnosis present

## 2021-12-31 DIAGNOSIS — Z91148 Patient's other noncompliance with medication regimen for other reason: Secondary | ICD-10-CM

## 2021-12-31 DIAGNOSIS — N183 Chronic kidney disease, stage 3 unspecified: Secondary | ICD-10-CM | POA: Diagnosis present

## 2021-12-31 DIAGNOSIS — A419 Sepsis, unspecified organism: Secondary | ICD-10-CM | POA: Diagnosis not present

## 2021-12-31 DIAGNOSIS — Z8249 Family history of ischemic heart disease and other diseases of the circulatory system: Secondary | ICD-10-CM

## 2021-12-31 DIAGNOSIS — I2699 Other pulmonary embolism without acute cor pulmonale: Secondary | ICD-10-CM

## 2021-12-31 DIAGNOSIS — I82433 Acute embolism and thrombosis of popliteal vein, bilateral: Secondary | ICD-10-CM | POA: Diagnosis present

## 2021-12-31 DIAGNOSIS — Z882 Allergy status to sulfonamides status: Secondary | ICD-10-CM

## 2021-12-31 DIAGNOSIS — N1831 Chronic kidney disease, stage 3a: Secondary | ICD-10-CM | POA: Diagnosis present

## 2021-12-31 DIAGNOSIS — I5033 Acute on chronic diastolic (congestive) heart failure: Secondary | ICD-10-CM | POA: Diagnosis present

## 2021-12-31 DIAGNOSIS — J189 Pneumonia, unspecified organism: Secondary | ICD-10-CM | POA: Diagnosis present

## 2021-12-31 DIAGNOSIS — Z79899 Other long term (current) drug therapy: Secondary | ICD-10-CM

## 2021-12-31 DIAGNOSIS — Z20822 Contact with and (suspected) exposure to covid-19: Secondary | ICD-10-CM | POA: Diagnosis present

## 2021-12-31 DIAGNOSIS — Z833 Family history of diabetes mellitus: Secondary | ICD-10-CM

## 2021-12-31 DIAGNOSIS — E1122 Type 2 diabetes mellitus with diabetic chronic kidney disease: Secondary | ICD-10-CM

## 2021-12-31 DIAGNOSIS — Z6841 Body Mass Index (BMI) 40.0 and over, adult: Secondary | ICD-10-CM

## 2021-12-31 DIAGNOSIS — Z95828 Presence of other vascular implants and grafts: Secondary | ICD-10-CM

## 2021-12-31 DIAGNOSIS — I82413 Acute embolism and thrombosis of femoral vein, bilateral: Secondary | ICD-10-CM | POA: Diagnosis present

## 2021-12-31 DIAGNOSIS — I10 Essential (primary) hypertension: Secondary | ICD-10-CM | POA: Diagnosis present

## 2021-12-31 DIAGNOSIS — Z888 Allergy status to other drugs, medicaments and biological substances status: Secondary | ICD-10-CM

## 2021-12-31 DIAGNOSIS — E1151 Type 2 diabetes mellitus with diabetic peripheral angiopathy without gangrene: Secondary | ICD-10-CM | POA: Diagnosis present

## 2021-12-31 DIAGNOSIS — J9601 Acute respiratory failure with hypoxia: Secondary | ICD-10-CM | POA: Diagnosis present

## 2021-12-31 DIAGNOSIS — Z91041 Radiographic dye allergy status: Secondary | ICD-10-CM

## 2021-12-31 DIAGNOSIS — Z7984 Long term (current) use of oral hypoglycemic drugs: Secondary | ICD-10-CM

## 2021-12-31 DIAGNOSIS — R6520 Severe sepsis without septic shock: Secondary | ICD-10-CM | POA: Diagnosis present

## 2021-12-31 DIAGNOSIS — Z86718 Personal history of other venous thrombosis and embolism: Secondary | ICD-10-CM

## 2021-12-31 DIAGNOSIS — I82409 Acute embolism and thrombosis of unspecified deep veins of unspecified lower extremity: Secondary | ICD-10-CM | POA: Diagnosis present

## 2021-12-31 DIAGNOSIS — E119 Type 2 diabetes mellitus without complications: Secondary | ICD-10-CM

## 2021-12-31 DIAGNOSIS — I130 Hypertensive heart and chronic kidney disease with heart failure and stage 1 through stage 4 chronic kidney disease, or unspecified chronic kidney disease: Secondary | ICD-10-CM | POA: Diagnosis present

## 2021-12-31 LAB — CBC WITH DIFFERENTIAL/PLATELET
Abs Immature Granulocytes: 0.02 10*3/uL (ref 0.00–0.07)
Basophils Absolute: 0 10*3/uL (ref 0.0–0.1)
Basophils Relative: 0 %
Eosinophils Absolute: 0.2 10*3/uL (ref 0.0–0.5)
Eosinophils Relative: 2 %
HCT: 49.1 % (ref 39.0–52.0)
Hemoglobin: 15.4 g/dL (ref 13.0–17.0)
Immature Granulocytes: 0 %
Lymphocytes Relative: 13 %
Lymphs Abs: 1.3 10*3/uL (ref 0.7–4.0)
MCH: 30.1 pg (ref 26.0–34.0)
MCHC: 31.4 g/dL (ref 30.0–36.0)
MCV: 96.1 fL (ref 80.0–100.0)
Monocytes Absolute: 0.6 10*3/uL (ref 0.1–1.0)
Monocytes Relative: 7 %
Neutro Abs: 7.3 10*3/uL (ref 1.7–7.7)
Neutrophils Relative %: 78 %
Platelets: 238 10*3/uL (ref 150–400)
RBC: 5.11 MIL/uL (ref 4.22–5.81)
RDW: 14.8 % (ref 11.5–15.5)
WBC: 9.4 10*3/uL (ref 4.0–10.5)
nRBC: 0 % (ref 0.0–0.2)

## 2021-12-31 LAB — LACTIC ACID, PLASMA
Lactic Acid, Venous: 2.6 mmol/L (ref 0.5–1.9)
Lactic Acid, Venous: 1.6 mmol/L (ref 0.5–1.9)

## 2021-12-31 LAB — RESP PANEL BY RT-PCR (FLU A&B, COVID) ARPGX2
Influenza A by PCR: NEGATIVE
Influenza B by PCR: NEGATIVE
SARS Coronavirus 2 by RT PCR: NEGATIVE

## 2021-12-31 LAB — URINALYSIS, COMPLETE (UACMP) WITH MICROSCOPIC
Bacteria, UA: NONE SEEN
Bilirubin Urine: NEGATIVE
Glucose, UA: NEGATIVE mg/dL
Hgb urine dipstick: NEGATIVE
Ketones, ur: NEGATIVE mg/dL
Leukocytes,Ua: NEGATIVE
Nitrite: NEGATIVE
Protein, ur: 30 mg/dL — AB
Specific Gravity, Urine: 1.015 (ref 1.005–1.030)
pH: 6 (ref 5.0–8.0)

## 2021-12-31 LAB — COMPREHENSIVE METABOLIC PANEL
ALT: 34 U/L (ref 0–44)
AST: 28 U/L (ref 15–41)
Albumin: 4.1 g/dL (ref 3.5–5.0)
Alkaline Phosphatase: 59 U/L (ref 38–126)
Anion gap: 9 (ref 5–15)
BUN: 16 mg/dL (ref 6–20)
CO2: 25 mmol/L (ref 22–32)
Calcium: 9 mg/dL (ref 8.9–10.3)
Chloride: 106 mmol/L (ref 98–111)
Creatinine, Ser: 1.42 mg/dL — ABNORMAL HIGH (ref 0.61–1.24)
GFR, Estimated: 59 mL/min — ABNORMAL LOW (ref >60)
Glucose, Bld: 117 mg/dL — ABNORMAL HIGH (ref 70–99)
Potassium: 4.6 mmol/L (ref 3.5–5.1)
Sodium: 140 mmol/L (ref 135–145)
Total Bilirubin: 1 mg/dL (ref 0.3–1.2)
Total Protein: 8.3 g/dL — ABNORMAL HIGH (ref 6.5–8.1)

## 2021-12-31 LAB — PROCALCITONIN: Procalcitonin: <0.1 ng/mL

## 2021-12-31 LAB — APTT: aPTT: 25 seconds (ref 24–36)

## 2021-12-31 LAB — PROTIME-INR
INR: 1 (ref 0.8–1.2)
Prothrombin Time: 13.4 seconds (ref 11.4–15.2)

## 2021-12-31 MED ORDER — SODIUM CHLORIDE 0.9 % IV SOLN
500.0000 mg | INTRAVENOUS | Status: DC
  Administered 2022-01-01: 500 mg via INTRAVENOUS
  Filled 2021-12-31: qty 5

## 2021-12-31 MED ORDER — VANCOMYCIN HCL 2000 MG/400ML IV SOLN
2000.0000 mg | Freq: Once | INTRAVENOUS | Status: AC
  Administered 2021-12-31: 2000 mg via INTRAVENOUS
  Filled 2021-12-31: qty 400

## 2021-12-31 MED ORDER — LACTATED RINGERS IV SOLN: INTRAVENOUS | Status: DC

## 2021-12-31 MED ORDER — POLYETHYLENE GLYCOL 3350 17 G PO PACK
17.0000 g | PACK | Freq: Every day | ORAL | Status: DC | PRN
  Administered 2022-01-02: 17 g via ORAL
  Filled 2021-12-31: qty 1

## 2021-12-31 MED ORDER — METRONIDAZOLE 500 MG/100ML IV SOLN
500.0000 mg | Freq: Once | INTRAVENOUS | Status: AC
  Administered 2021-12-31: 500 mg via INTRAVENOUS
  Filled 2021-12-31: qty 100

## 2021-12-31 MED ORDER — ACETAMINOPHEN 500 MG PO TABS
1000.0000 mg | ORAL_TABLET | Freq: Once | ORAL | Status: AC
Start: 2021-12-31 — End: 2021-12-31
  Administered 2021-12-31: 1000 mg via ORAL
  Filled 2021-12-31: qty 2

## 2021-12-31 MED ORDER — SODIUM CHLORIDE 0.9 % IV BOLUS (SEPSIS)
1000.0000 mL | Freq: Once | INTRAVENOUS | Status: AC
  Administered 2021-12-31: 1000 mL via INTRAVENOUS

## 2021-12-31 MED ORDER — VANCOMYCIN HCL 2000 MG/400ML IV SOLN
2000.0000 mg | INTRAVENOUS | Status: DC
  Administered 2022-01-01: 2000 mg via INTRAVENOUS
  Filled 2021-12-31: qty 400

## 2021-12-31 MED ORDER — AMLODIPINE BESYLATE 10 MG PO TABS
10.0000 mg | ORAL_TABLET | Freq: Every day | ORAL | Status: DC
  Administered 2022-01-01 – 2022-01-04 (×5): 10 mg via ORAL
  Filled 2021-12-31 (×2): qty 1
  Filled 2021-12-31 (×3): qty 2

## 2021-12-31 MED ORDER — ONDANSETRON HCL 4 MG PO TABS: 4.0000 mg | ORAL_TABLET | Freq: Four times a day (QID) | ORAL | Status: DC | PRN

## 2021-12-31 MED ORDER — ACETAMINOPHEN 325 MG PO TABS
650.0000 mg | ORAL_TABLET | Freq: Four times a day (QID) | ORAL | Status: DC | PRN
  Administered 2022-01-01 (×2): 650 mg via ORAL
  Filled 2021-12-31 (×2): qty 2

## 2021-12-31 MED ORDER — INSULIN ASPART 100 UNIT/ML IJ SOLN
0.0000 [IU] | Freq: Three times a day (TID) | INTRAMUSCULAR | Status: DC
  Administered 2022-01-01 – 2022-01-02 (×2): 3 [IU] via SUBCUTANEOUS
  Filled 2021-12-31 (×2): qty 1

## 2021-12-31 MED ORDER — AMLODIPINE BESYLATE 5 MG PO TABS: 10.0000 mg | ORAL_TABLET | Freq: Every day | ORAL | Status: DC

## 2021-12-31 MED ORDER — RIVAROXABAN 20 MG PO TABS
20.0000 mg | ORAL_TABLET | Freq: Every day | ORAL | Status: DC
  Administered 2022-01-01 – 2022-01-04 (×4): 20 mg via ORAL
  Filled 2021-12-31 (×4): qty 1

## 2021-12-31 MED ORDER — ONDANSETRON HCL 4 MG/2ML IJ SOLN
4.0000 mg | Freq: Once | INTRAMUSCULAR | Status: AC
  Administered 2021-12-31: 4 mg via INTRAVENOUS
  Filled 2021-12-31: qty 2

## 2021-12-31 MED ORDER — LOSARTAN POTASSIUM 50 MG PO TABS: 50.0000 mg | ORAL_TABLET | Freq: Every day | ORAL | Status: DC

## 2021-12-31 MED ORDER — METOPROLOL SUCCINATE ER 25 MG PO TB24
25.0000 mg | ORAL_TABLET | Freq: Every day | ORAL | Status: DC
  Administered 2022-01-01 – 2022-01-04 (×4): 25 mg via ORAL
  Filled 2021-12-31 (×4): qty 1

## 2021-12-31 MED ORDER — SPIRONOLACTONE 25 MG PO TABS: 25.0000 mg | ORAL_TABLET | Freq: Every day | ORAL | Status: DC

## 2021-12-31 MED ORDER — INSULIN ASPART 100 UNIT/ML IJ SOLN
0.0000 [IU] | Freq: Every day | INTRAMUSCULAR | Status: DC
  Filled 2021-12-31: qty 1

## 2021-12-31 MED ORDER — ONDANSETRON HCL 4 MG/2ML IJ SOLN: 4.0000 mg | Freq: Four times a day (QID) | INTRAMUSCULAR | Status: DC | PRN

## 2021-12-31 MED ORDER — IOHEXOL 350 MG/ML SOLN
100.0000 mL | Freq: Once | INTRAVENOUS | Status: AC | PRN
  Administered 2021-12-31: 100 mL via INTRAVENOUS
  Filled 2021-12-31: qty 100

## 2021-12-31 MED ORDER — SODIUM CHLORIDE 0.9 % IV SOLN
2.0000 g | Freq: Three times a day (TID) | INTRAVENOUS | Status: DC
  Administered 2022-01-01 – 2022-01-04 (×11): 2 g via INTRAVENOUS
  Filled 2021-12-31 (×12): qty 12.5

## 2021-12-31 MED ORDER — SODIUM CHLORIDE 0.9 % IV SOLN: INTRAVENOUS | Status: AC

## 2021-12-31 MED ORDER — VANCOMYCIN HCL IN DEXTROSE 1-5 GM/200ML-% IV SOLN: 1000.0000 mg | Freq: Once | INTRAVENOUS | Status: DC

## 2021-12-31 MED ORDER — ACETAMINOPHEN 650 MG RE SUPP: 650.0000 mg | Freq: Four times a day (QID) | RECTAL | Status: DC | PRN

## 2021-12-31 MED ORDER — LABETALOL HCL 5 MG/ML IV SOLN: 5.0000 mg | INTRAVENOUS | Status: DC | PRN

## 2021-12-31 MED ORDER — SODIUM CHLORIDE 0.9 % IV SOLN
2.0000 g | Freq: Once | INTRAVENOUS | Status: AC
  Administered 2021-12-31: 2 g via INTRAVENOUS
  Filled 2021-12-31: qty 12.5

## 2021-12-31 NOTE — ED Notes (Signed)
Patient taken to CT.

## 2021-12-31 NOTE — ED Provider Notes (Signed)
? ?[facility] ?Provider Note ? ? ? Event Date/Time  ? First MD Initiated Contact with Patient 12/31/21 1812   ?  (approximate) ? ?History  ? ?Chief Complaint: Shortness of Breath ? ?HPI ? ?Ryan [name] is a 51 y.o. male with a past medical history diabetes, CHF, hypertension, prior DVT and PE on Xarelto who presents to the emergency department for cough congestion fever.  According to the patient starting this morning patient developed a cough that has worsened throughout the morning along with congestion states occasional episodes of vomiting that occur after coughing spells consistent posttussive emesis.  Patient states he has been shaking with chills this afternoon, upon arrival patient is febrile to 103.1 he is tachycardic he is tachypneic.  Patient denies any chest pain or abdominal pain. ? ?Physical Exam  ? ?Triage Vital Signs: ?ED Triage Vitals  ?Enc Vitals Group  ?   BP 12/31/21 1808 (!) 180/100  ?   Pulse Rate 12/31/21 1808 (!) 102  ?   Resp 12/31/21 1808 (!) 30  ?   Temp 12/31/21 1808 (!) 103.1 ?F (39.5 ?C)  ?   Temp Source 12/31/21 1808 Oral  ?   SpO2 12/31/21 1808 100 %  ?   Weight 12/31/21 1809 (!) 360 lb (163.3 kg)  ?   Height 12/31/21 1809 5\' 7"  (1.702 m)  ?   Head Circumference --   ?   Peak Flow --   ?   Pain Score 12/31/21 1808 5  ?   Pain Loc --   ?   Pain Edu? --   ?   Excl. in GC? --   ? ? ?Most recent vital signs: ?Vitals:  ? 12/31/21 1808  ?BP: (!) 180/100  ?Pulse: (!) 102  ?Resp: (!) 30  ?Temp: (!) 103.1 ?F (39.5 ?C)  ?SpO2: 100%  ? ? ?General: Awake, no distress.  Morbidly obese. ?CV:  Good peripheral perfusion.  Regular rhythm rate around 110 bpm. ?Resp:  Moderate tachypnea.  No obvious wheeze rales or rhonchi, equal breath sounds bilaterally. ?Abd:  No distention.  Soft, nontender.  No rebound or guarding. ?Other:  Patient has chronic lower extremity edema bilaterally with some calf tenderness bilaterally right greater than left. ? ? ?ED Results / Procedures /  Treatments  ? ?EKG ? ?EKG viewed and interpreted by myself shows sinus tachycardia 105 bpm with a narrow QRS, normal axis, normal intervals, no concerning ST changes. ? ?RADIOLOGY ? ?Chest x-ray viewed by myself shows low inflation but no obvious pneumonia. ?Radiology is read the chest x-ray is minimal right basilar atelectasis. ? ? ?MEDICATIONS ORDERED IN ED: ?Medications  ?lactated ringers infusion (has no administration in time range)  ?sodium chloride 0.9 % bolus 1,000 mL (has no administration in time range)  ?ceFEPIme (MAXIPIME) 2 g in sodium chloride 0.9 % 100 mL IVPB (has no administration in time range)  ?metroNIDAZOLE (FLAGYL) IVPB 500 mg (has no administration in time range)  ?vancomycin (VANCOCIN) IVPB 1000 mg/200 mL premix (has no administration in time range)  ? ? ? ?IMPRESSION / MDM / ASSESSMENT AND PLAN / ED COURSE  ?I reviewed the triage vital signs and the nursing notes. ? ?Patient presents to the emergency department with cough congestion and fever to 103.  Patient is tachycardic and tachypneic as well as febrile consistent with sepsis.  We will check labs, cultures, lactic acid we will start the patient on IV fluids, dose Tylenol start broad-spectrum antibiotics.  Differential is quite  broad but given the cough and fever pneumonia is high on the differential as well as COVID/influenza, possible pulmonary embolism although the patient is on Xarelto and denies missing any doses.  We will continue close monitor while awaiting further results and x-ray. ? ?Lab work shows normal CBC including normal white blood cell count, reassuring chemistry.  Patient's COVID and flu is negative.  Chest x-ray shows right basilar atelectasis given the patient's significant fever cough highly suspect pneumonia.  Patient receiving broad-spectrum antibiotics.  We will admit to the hospital service for ongoing management of his likely pneumonia leading to sepsis.  Lactic acid has resulted at 2.6.  Patient receiving IV  fluids Tylenol and Zofran. ? ?CRITICAL CARE ?Performed by: Minna Antis ? ? ?Total critical care time: 30 minutes ? ?Critical care time was exclusive of separately billable procedures and treating other patients. ? ?Critical care was necessary to treat or prevent imminent or life-threatening deterioration. ? ?Critical care was time spent personally by me on the following activities: development of treatment plan with patient and/or surrogate as well as nursing, discussions with consultants, evaluation of patient's response to treatment, examination of patient, obtaining history from patient or surrogate, ordering and performing treatments and interventions, ordering and review of laboratory studies, ordering and review of radiographic studies, pulse oximetry and re-evaluation of patient's condition. ? ? ?FINAL CLINICAL IMPRESSION(S) / ED DIAGNOSES  ? ?Sepsis ?Pneumonia ? ?Note:  This document was prepared using Dragon voice recognition software and may include unintentional dictation errors. ?  ?Minna Antis, MD ?12/31/21 1945 ? ?

## 2021-12-31 NOTE — H&P (Signed)
?History and Physical  ? ?Ryan [name] [id number] DOB: 08-17-1971 DOA: 12/31/2021 ? ?PCP: Ryan Billings, NP  ?Outpatient Specialists: Dr. Emmie [name], [facility] ?Patient coming from: Home via EMS ? ?I have personally briefly reviewed patient's old medical records in [place]. ? ?Chief Concern: shortness of breath  ? ?HPI: Ryan [name] is a 51 year old male with history of DVT, A-fib, current IVC filter and on xarelto for history of multiple DVT and PE, morbid obesity, hypertension, non-insulin-dependent diabetes mellitus, who presents emergency department for chief concerns of shortness of breath, leg swelling, fever. ? ?Initial vitals in the emergency department showed temperature of 103.1, respiration rate of 15, heart rate of 95, blood pressure 165/106, SPO2 of 100% on 4 L nasal cannula. ? ?Serum sodium is 140, potassium 4.6, chloride of 106, bicarb 25, BUN of 16, serum creatinine of 1.42, nonfasting blood glucose 117, GFR 59. ? ?WBC is 9.4, hemoglobin 15.4, platelets of 238. ? ?COVID/influenza A/influenza B PCR were negative. ? ?ED treatment: Ondansetron 4 mg IV, acetaminophen 1000 mg, cefepime 2 g IV, vancomycin IV, metronidazole 500 mg IV, sodium chloride 1 L bolus, ED provider started patient on lactated ringer 150 ml per hour. ? ?At bedside patient is awake alert and oriented x3. ? ?He presents to the ED, for chief concerns of trouble breathing, started at 3p-4p on day of presentation. He stated having trembling. He endorses fever, tmax of 103. ? ?He endorses right calf pain that started two days ago. He has been walking and this causes soreness therefore he did not think anything about this. However, after stretching it would go away, which is unusual for him. And it is just the right calf and not the left calf which is very similar to his prior presentation of clots in his legs in the past. ? ?He states these are similar to prior episodes of blood clots in the legs.  ? ?He endorses a  new cough that started today, which he states is nonproductive. He endorses chest tenderness with coughing.  ? ?He denis abdominal pain, dysuria, hematuria, diarrhea, consitpation, LOC, syncope.  ?He denies vision changes.  ? ?He endorses compliance with xarelto 20 mg daily. He took his xarelto at 4 pm on day of admission.  ? ?Social history: He lives at home with his daughter. He denies tobacco, etoh, and recreational drug use. He works providing prior Engineer, mining for [employer]. ? ?Vaccination history: He received two doses of covid 19. He is not vaccianted for influenza. ? ?ROS: ?Constitutional: no weight change, + fever ?ENT/Mouth: no sore throat, no rhinorrhea ?Eyes: no eye pain, no vision changes ?Cardiovascular: no chest pain, + dyspnea,  no edema, no palpitations ?Respiratory: + cough, no sputum, no wheezing ?Gastrointestinal: no nausea, no vomiting, no diarrhea, no constipation ?Genitourinary: no urinary incontinence, no dysuria, no hematuria ?Musculoskeletal: no arthralgias, + left calf pain ?Skin: no skin lesions, no pruritus, ?Neuro: no weakness, no loss of consciousness, no syncope ?Psych: no anxiety, no depression, no decrease appetite ?Heme/Lymph: no bruising, no bleeding ? ?ED Course: Discussed with emergency medicine provider, patient requiring hospitalization for chief concerns of meeting sepsis criteria. ? ?Assessment/Plan ? ?Principal Problem: ?  Shortness of breath ?Active Problems: ?  CKD (chronic kidney disease), stage IIIa ?  HTN (hypertension) ?  Diabetes mellitus without complication ([place]) ?  Morbid obesity with BMI of 50.0-59.9, adult ([place]) ?  SOB (shortness of breath) ?  History of pulmonary embolism ?  History of DVT (deep vein thrombosis) ?  ?  Assessment and Plan: ? ?* Shortness of breath ?- Query pulmonary embolism, possible failed Xarelto ?- Patient has history of failing Eliquis due to his weight ?- Bilateral lower extremity ultrasound to assess for DVT and CTA PE ordered, If positive we  will order heparin GTT and stop the Xarelto, consult vascular for thrombectomy, complete echo ?- Received message from radiology tech stating "Hello. Ryan [name] has contrast allergy."  I responded with "Please talk to him or see the allergy note. Ryan [name], if you have further questions, please call me in the ED and I can explain Ryan [name] history for you. The word allergy does not necessarily mean allergy as it is used w/o differentiation by many of our staff. If you do not have further questions, please do not delay my patient's care. Thank you. He needs the CTA PE." ?- Admit to telemetry cardiac, observation ? ?CKD (chronic kidney disease), stage IIIa ?- At baseline ? ?History of DVT (deep vein thrombosis) ?- Patient is currently has 2 IVC filters in place and has had clotting causing blockage ?- Resumed home Xarelto 20 mg daily for 01/01/2022 as patient took his home dose prior to presenting to the ED ? ?History of pulmonary embolism ?- History of PE on Eliquis and with IVC filter in place ?- CTA PE ordered ?- Given history of renal failure requiring dialysis in 2013 (after patient received multiple imaging with contrast in a short amount of time) I would ensure that patient is renally profuse ?- He is status post 2 L sodium chloride and is getting sodium chloride 150 mL/h, 12 hours ordered ?- BMP in the a.m. ? ?Diabetes mellitus without complication ([place]) ?- Jardiance has not been resumed ?- Insulin SSI with agents coverage for obese/insulin resistant dosing ordered ? ?HTN (hypertension) ?- Per med reconciliation patient is noncompliant with his blood pressure medicine and has not taken it at home in the last few months ?- I have resumed amlodipine 10 mg for day of admission and metoprolol succinate 25 mg daily ?- Labetalol 5 mg IV every 2 hours as needed for SBP greater than 175, 4 doses ordered ? ?Chart reviewed.  ? ?DVT prophylaxis: Xarelto ?Code Status: Full code ?Diet: Heart healthy/carb modified ?Family  Communication: Updated spouse, Ryan [name] at [phone] ?Disposition Plan: Pending clinical course ?Consults called: None at this time ?Admission status: Telemetry cardiac, observation ? ?Past Medical History:  ?Diagnosis Date  ? CHF (congestive heart failure) ([place])   ? Diabetes mellitus without complication ([place])   ? per pt-pre diabetic-Dr Rosario Jacks stated said pt  ? DVT (deep venous thrombosis) ([place])   ? Hypertension   ? Peripheral vascular disease ([place])   ? Presence of IVC filter   ? ?Past Surgical History:  ?Procedure Laterality Date  ? CARDIAC ELECTROPHYSIOLOGY STUDY AND ABLATION  10/2020  ? PERIPHERAL VASCULAR CATHETERIZATION N/A 01/10/2015  ? Procedure: Dialysis/Perma Catheter Insertion;  Surgeon: Katha Cabal, MD;  Location: [facility];  Service: Cardiovascular;  Laterality: N/A;  ? PERIPHERAL VASCULAR CATHETERIZATION N/A 02/24/2015  ? Procedure: Dialysis/Perma Catheter Removal;  Surgeon: Algernon Huxley, MD;  Location: [facility] [facility];  Service: Cardiovascular;  Laterality: N/A;  ? ?Social History:  reports that he has never smoked. He has never used smokeless tobacco. He reports that he does not drink alcohol and does not use drugs. ? ?Allergies  ?Allergen Reactions  ? Iodinated Contrast Media Other (See Comments)  ?  Kidney failure requiring dialysis in 2013. He has  had cta for PE since 2013.  ?  ? Metrizamide Other (See Comments)  ?  Kidney failure requiring dialysis ?Kidney failure requiring dialysis ?  ? Clindamycin/Lincomycin Rash  ? Lincomycin Rash  ? Sulfa Antibiotics Rash  ? ?Family History  ?Problem Relation Age of Onset  ? Hypertension Mother   ? Cancer Mother   ? Breast cancer Mother   ? Prostate cancer Father   ? Hypertension Father   ? Diabetes Father   ? Pulmonary embolism Paternal Grandfather   ? Pulmonary embolism Paternal Uncle   ? ?Family history: Family history reviewed and not pertinent. ? ?Prior to Admission medications   ?Medication Sig Start Date End Date Taking?  Authorizing Provider  ?amLODipine (NORVASC) 10 MG tablet Take 10 mg by mouth daily. 12/04/21  Yes [provider]  ?empagliflozin (JARDIANCE) 25 MG TABS tablet Take 1 tablet (25 mg total) by mouth daily

## 2021-12-31 NOTE — Assessment & Plan Note (Addendum)
-   Query pulmonary embolism, possible failed Xarelto ?- Patient has history of failing Eliquis due to his weight ?- Bilateral lower extremity ultrasound to assess for DVT and CTA PE ordered, If positive we will order heparin GTT and stop the Xarelto, consult vascular for thrombectomy, complete echo ?- Received message from radiology tech stating "Hello. Ryan [name] has contrast allergy."  I responded with "Please talk to him or see the allergy note. Ryan [name], if you have further questions, please call me in the ED and I can explain Ryan [name] history for you. The word allergy does not necessarily mean allergy as it is used w/o differentiation by many of our staff. If you do not have further questions, please do not delay my patient's care. Thank you. He needs the CTA PE." ?- Admit to telemetry cardiac, observation ?

## 2021-12-31 NOTE — Assessment & Plan Note (Addendum)
-   Per med reconciliation patient is noncompliant with his blood pressure medicine and has not taken it at home in the last few months.  Blood pressures initially quite high on admission at 180/100, but has quickly improved. ?- I have resumed amlodipine 10 mg for day of admission and metoprolol succinate 25 mg daily ?- Labetalol 5 mg IV every 2 hours as needed for SBP greater than 175, 4 doses ordered ?

## 2021-12-31 NOTE — ED Triage Notes (Signed)
Pt arrives via EMS from home- pt states about 2 hours ago he started having leg pain, then chills, then [facility]- pt has a hx of PEs in November- pt has a wound to the L ankle per EMS- pt has a fever of 100.4, cbg 151, and 103 HR per EMS ?

## 2021-12-31 NOTE — Consult Note (Signed)
PHARMACY -  BRIEF ANTIBIOTIC NOTE  ? ?Pharmacy has received consult(s) for Vancomycin and Cefepime from an ED provider.  The patient's profile has been reviewed for ht/wt/allergies/indication/available labs.   ? ?One time order(s) placed for Vancomycin 1g IV and Cefepime 2g IV x 1 dose each. ? ?Further antibiotics/pharmacy consults should be ordered by admitting physician if indicated.       ?                ?Thank you, ?Berma Harts A Mykia Holton ?12/31/2021  6:36 PM ? ?

## 2021-12-31 NOTE — ED Notes (Signed)
Ultrasound tech at bedside

## 2021-12-31 NOTE — Assessment & Plan Note (Addendum)
-   Patient is currently has 2 IVC filters in place and has had clotting causing blockage ?- Resumed home Xarelto 20 mg daily for 01/01/2022 as patient took his home dose prior to presenting to the ED ?

## 2021-12-31 NOTE — Consult Note (Addendum)
Pharmacy Antibiotic Note ? ?Ryan [name] is a 51 y.o. male admitted on 12/31/2021 with cough and fever.  Pharmacy has been consulted for Vancomycin and Cefepime dosing. Patient received one dose of Metronidazole in ED. ? ?Plan: ?Vancomycin 2g IV x 1 as loading dose, followed by 2g IV Q 24 hrs. Goal AUC 400-550. ?Expected AUC: 474.5 ?Expected Css: 11.1 ?SCr used: 1.42 ? ?Cefepime 2g IV Q 8 hours ? ? ?Height: 5\' 7"  (170.2 cm) ?Weight: (!) 163.3 kg (360 lb) ?IBW/kg (Calculated) : 66.1 ? ?Temp (24hrs), Avg:102.8 ?F (39.3 ?C), Min:102.4 ?F (39.1 ?C), Max:103.1 ?F (39.5 ?C) ? ?Recent Labs  ?Lab 12/31/21 ?1828 12/31/21 ?2046  ?WBC 9.4  --   ?CREATININE 1.42*  --   ?LATICACIDVEN 2.6* 1.6  ?  ?Estimated Creatinine Clearance: 90.4 mL/min (A) (by C-G formula based on SCr of 1.42 mg/dL (H)).   ? ?Allergies  ?Allergen Reactions  ? Iodinated Contrast Media Other (See Comments)  ?  Kidney failure requiring dialysis ?Kidney failure requiring dialysis ?Kidney failure requiring dialysis ?  ? Metrizamide Other (See Comments)  ?  Kidney failure requiring dialysis ?Kidney failure requiring dialysis ?  ? Clindamycin/Lincomycin Rash  ? Lincomycin Rash  ? Sulfa Antibiotics Rash  ? ? ?Antimicrobials this admission: ?Vanc 5/4 >>  ?CFP 5/4 >> ?Flagyl 5/4 x1 ? ?Dose adjustments this admission: ?N/a ? ?Microbiology results: ?5/4 BCx: sent ?5/4 UCx: sent  ?5/4 MRSA PCR: pending ? ?Thank you for allowing pharmacy to be a part of this patient?s care. ? ?Pearla Dubonnet ?12/31/2021 9:19 PM ? ?

## 2021-12-31 NOTE — Assessment & Plan Note (Addendum)
-   History of PE on Eliquis and with IVC filter in place ?- CTA PE ordered ?- Given history of renal failure requiring dialysis in 2013 (after patient received multiple imaging with contrast in a short amount of time) I would ensure that patient is renally profuse ?- He is status post 2 L sodium chloride and is getting sodium chloride 150 mL/h, 12 hours ordered ?- BMP in the a.m. ?

## 2021-12-31 NOTE — Progress Notes (Signed)
Elink following code sepsis °

## 2021-12-31 NOTE — Assessment & Plan Note (Addendum)
-   Ryan [name] has not been resumed ?- Insulin SSI with agents coverage for obese/insulin resistant dosing ordered.  He does have good control as A1c 2 months ago at 6.5 ?

## 2021-12-31 NOTE — Hospital Course (Addendum)
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  He has been on Xarelto ever since and compliant.  He presented to the emergency room on 5/4 with 1 day of shortness of breath, fever and chills.  In the emergency room, found to have severe sepsis with a lactic acid level initially of 2.6.  Interestingly, initial procalcitonin level normal at less than 10, however repeat level this morning elevated at 1.71.  CT scan negative for PE, but also did not show any signs of infiltrate.  Patient also found to be hypoxic requiring 4 L nasal cannula. ? ?In addition, patient in the last 1 to 2 days complaining of some right calf pain especially when stretching his leg.  Following admission, lower extremity Dopplers done and patient found to have bilateral nonocclusive thrombus, however this looks to be subacute versus chronic. ? ?By 5/6, patient much improved, no longer tachycardic and has been more than 24 hours since he has spiked a fever.  Oxygenation much improved and he is currently at 94% on 1 L.  Echocardiogram notes grade 2 diastolic dysfunction.  Patient given IV Lasix and diuresed almost 3 L.  By 5/8, he was able to ambulate on room air comfortably. ?

## 2021-12-31 NOTE — Assessment & Plan Note (Signed)
At baseline 

## 2022-01-01 DIAGNOSIS — J9601 Acute respiratory failure with hypoxia: Secondary | ICD-10-CM | POA: Diagnosis not present

## 2022-01-01 DIAGNOSIS — Z6841 Body Mass Index (BMI) 40.0 and over, adult: Secondary | ICD-10-CM

## 2022-01-01 DIAGNOSIS — R6520 Severe sepsis without septic shock: Secondary | ICD-10-CM | POA: Diagnosis present

## 2022-01-01 DIAGNOSIS — E872 Acidosis, unspecified: Secondary | ICD-10-CM | POA: Diagnosis present

## 2022-01-01 DIAGNOSIS — I825Z3 Chronic embolism and thrombosis of unspecified deep veins of distal lower extremity, bilateral: Secondary | ICD-10-CM | POA: Diagnosis not present

## 2022-01-01 DIAGNOSIS — Z91041 Radiographic dye allergy status: Secondary | ICD-10-CM | POA: Diagnosis not present

## 2022-01-01 DIAGNOSIS — I82433 Acute embolism and thrombosis of popliteal vein, bilateral: Secondary | ICD-10-CM | POA: Diagnosis present

## 2022-01-01 DIAGNOSIS — E1151 Type 2 diabetes mellitus with diabetic peripheral angiopathy without gangrene: Secondary | ICD-10-CM | POA: Diagnosis present

## 2022-01-01 DIAGNOSIS — I4891 Unspecified atrial fibrillation: Secondary | ICD-10-CM | POA: Diagnosis present

## 2022-01-01 DIAGNOSIS — Z91148 Patient's other noncompliance with medication regimen for other reason: Secondary | ICD-10-CM | POA: Diagnosis not present

## 2022-01-01 DIAGNOSIS — I5032 Chronic diastolic (congestive) heart failure: Secondary | ICD-10-CM | POA: Diagnosis not present

## 2022-01-01 DIAGNOSIS — K760 Fatty (change of) liver, not elsewhere classified: Secondary | ICD-10-CM | POA: Diagnosis present

## 2022-01-01 DIAGNOSIS — A419 Sepsis, unspecified organism: Principal | ICD-10-CM | POA: Diagnosis present

## 2022-01-01 DIAGNOSIS — Z86711 Personal history of pulmonary embolism: Secondary | ICD-10-CM | POA: Diagnosis not present

## 2022-01-01 DIAGNOSIS — I5033 Acute on chronic diastolic (congestive) heart failure: Secondary | ICD-10-CM | POA: Diagnosis present

## 2022-01-01 DIAGNOSIS — Z86718 Personal history of other venous thrombosis and embolism: Secondary | ICD-10-CM | POA: Diagnosis not present

## 2022-01-01 DIAGNOSIS — E1122 Type 2 diabetes mellitus with diabetic chronic kidney disease: Secondary | ICD-10-CM | POA: Diagnosis present

## 2022-01-01 DIAGNOSIS — Z95828 Presence of other vascular implants and grafts: Secondary | ICD-10-CM | POA: Diagnosis not present

## 2022-01-01 DIAGNOSIS — I130 Hypertensive heart and chronic kidney disease with heart failure and stage 1 through stage 4 chronic kidney disease, or unspecified chronic kidney disease: Secondary | ICD-10-CM | POA: Diagnosis present

## 2022-01-01 DIAGNOSIS — Z882 Allergy status to sulfonamides status: Secondary | ICD-10-CM | POA: Diagnosis not present

## 2022-01-01 DIAGNOSIS — J189 Pneumonia, unspecified organism: Secondary | ICD-10-CM

## 2022-01-01 DIAGNOSIS — N1831 Chronic kidney disease, stage 3a: Secondary | ICD-10-CM | POA: Diagnosis present

## 2022-01-01 DIAGNOSIS — I82413 Acute embolism and thrombosis of femoral vein, bilateral: Secondary | ICD-10-CM | POA: Diagnosis present

## 2022-01-01 DIAGNOSIS — Z20822 Contact with and (suspected) exposure to covid-19: Secondary | ICD-10-CM | POA: Diagnosis present

## 2022-01-01 DIAGNOSIS — Z888 Allergy status to other drugs, medicaments and biological substances status: Secondary | ICD-10-CM | POA: Diagnosis not present

## 2022-01-01 LAB — PROTIME-INR
INR: 1.2 (ref 0.8–1.2)
Prothrombin Time: 15.4 seconds — ABNORMAL HIGH (ref 11.4–15.2)

## 2022-01-01 LAB — PROCALCITONIN
Procalcitonin: 1.71 ng/mL
Procalcitonin: 1.5 ng/mL

## 2022-01-01 LAB — CBG MONITORING, ED
Glucose-Capillary: 113 mg/dL — ABNORMAL HIGH (ref 70–99)
Glucose-Capillary: 115 mg/dL — ABNORMAL HIGH (ref 70–99)
Glucose-Capillary: 121 mg/dL — ABNORMAL HIGH (ref 70–99)
Glucose-Capillary: 125 mg/dL — ABNORMAL HIGH (ref 70–99)
Glucose-Capillary: 131 mg/dL — ABNORMAL HIGH (ref 70–99)

## 2022-01-01 LAB — MRSA NEXT GEN BY PCR, NASAL: MRSA by PCR Next Gen: NOT DETECTED

## 2022-01-01 LAB — CREATININE, SERUM
Creatinine, Ser: 1.44 mg/dL — ABNORMAL HIGH (ref 0.61–1.24)
GFR, Estimated: 58 mL/min — ABNORMAL LOW (ref >60)

## 2022-01-01 LAB — BRAIN NATRIURETIC PEPTIDE: B Natriuretic Peptide: 67.8 pg/mL (ref 0.0–100.0)

## 2022-01-01 LAB — CORTISOL-AM, BLOOD: Cortisol - AM: 20.4 ug/dL (ref 6.7–22.6)

## 2022-01-01 LAB — LACTIC ACID, PLASMA: Lactic Acid, Venous: 0.8 mmol/L (ref 0.5–1.9)

## 2022-01-01 NOTE — Assessment & Plan Note (Addendum)
Nonocclusive bilateral thrombus seen on bilateral lower extremities.  However, these look to be subacute versus chronic.  This is the cause of patient's calf pain.  However I do not think that this is failure of Xarelto.  Patient was last seen for 2-1/2 months ago and at that time for PE which was secondary to unable to get insurance coverage for Xarelto so for a time, he was not on any anticoagulation.  This led to PE requiring vascular intervention.  At that time, patient did not have lower extremity Dopplers done.  He since then however has been on Xarelto which would account therefore for no PE, and likely this is left over thrombus from them.  He has a previous history of IVC filter x2, but these are over 43 years old. ?

## 2022-01-01 NOTE — Assessment & Plan Note (Signed)
Noted on CT.  Patient is aware of this issue and it is being followed.  Monitor LFTs. ?

## 2022-01-01 NOTE — Assessment & Plan Note (Signed)
Meets criteria BMI at 56 ?

## 2022-01-01 NOTE — Assessment & Plan Note (Addendum)
Patient meets criteria for severe sepsis on admission given lactic acidosis, tachypnea, tachycardia and fever with respiratory failure and pneumonia source.  Lactic acidosis did resolve with IV fluids although patient still with tachycardia some fever and tachypnea.  However by 5/6, patient much improved.  Procalcitonin continues to trend downward.  Sepsis has since resolved ?

## 2022-01-01 NOTE — Progress Notes (Signed)
Admission profile updated. ?

## 2022-01-01 NOTE — Assessment & Plan Note (Addendum)
Given his significant hypoxia and sepsis, continue cefepime although we will discontinue vancomycin..  Procalcitonin level continue to improve.  Discharged on 3 more days of p.o. Augmentin for total of 7 days of therapy. ?

## 2022-01-01 NOTE — Assessment & Plan Note (Addendum)
Secondary to pneumonia.  No evidence of PE on CT.  Should improve over time.  BNP normal although with his morbid obesity, we will certainly check echocardiogram to rule out diastolic heart failure.  Continues to improve, currently down to 2 L on nasal cannula ?

## 2022-01-01 NOTE — Progress Notes (Addendum)
[facility] Progress Note ? ?Patient: Ryan [name]    [id number]  DOA: 12/31/2021    ?Date of Service: the patient was seen and examined on 01/01/2022 ? 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  He has been on Xarelto ever since and compliant.  He presented to the emergency room on 5/4 with 1 day of shortness of breath, fever and chills.  In the emergency room, found to have severe sepsis with a lactic acid level initially of 2.6.  Interestingly, initial procalcitonin level normal at less than 10, however repeat level this morning elevated at 1.71.  CT scan negative for PE, but also did not show any signs of infiltrate.  Patient also found to be hypoxic requiring 4 L nasal cannula. ? ?In addition, patient in the last 1 to 2 days complaining of some right calf pain especially when stretching his leg.  Following admission, lower extremity Dopplers done and patient found to have bilateral nonocclusive thrombus, however this looks to be subacute versus chronic. ? ?Assessment and Plan: ?Assessment and Plan: ?* Severe sepsis ([place]) ?Patient meets criteria for severe sepsis on admission given lactic acidosis, tachypnea, tachycardia and fever with respiratory failure and pneumonia source.  Lactic acidosis did resolve with IV fluids although patient still with tachycardia some fever and tachypnea.  Curiously procalcitonin had delayed increased, however follow-up procalcitonin 12 hours later does note some mild improvement so he is responding to antibiotics. ? ?Acute respiratory failure with hypoxia ([place]) ?Secondary to pneumonia.  No evidence of PE on CT.  Should improve over time.  BNP normal although with his morbid obesity, we will certainly  check echocardiogram to rule out diastolic heart failure.  Until patient's symptoms are better, would hold off on diuresis for right now ? ?CAP (community acquired pneumonia) ?Given his significant hypoxia and sepsis, continue broad-spectrum antibiotics.  Procalcitonin level starting to trend downward. ? ?DVT (deep venous thrombosis) ([place]) ?Nonocclusive bilateral thrombus seen on bilateral lower extremities.  However, these look to be subacute versus chronic.  This is the cause of patient's calf pain.  However I do not think that this is failure of Xarelto.  Patient was last seen for 2-1/2 months ago and at that time for PE which was secondary to unable to get insurance coverage for Xarelto so for a time, he was not on any anticoagulation.  This led to PE requiring vascular intervention.  At that time, patient did not have lower extremity Dopplers done.  He since then however has been on Xarelto which would account therefore for no PE, and likely this is left over thrombus from them.  He has a previous history of IVC filter x2, but these are over 57 years old. ? ?HTN (hypertension) ?- Per med reconciliation patient is noncompliant with his blood pressure medicine and has not taken it at home in the last few months.  Blood pressures initially quite high on admission at 180/100, but has quickly improved. ?- I have resumed amlodipine 10 mg for day of admission and metoprolol succinate 25 mg daily ?- Labetalol 5 mg IV every 2 hours as needed for SBP greater than 175, 4 doses ordered ? ?Hepatic steatosis ?Noted on CT.  Patient is aware of  this issue and it is being followed.  Monitor LFTs. ? ?CKD (chronic kidney disease), stage IIIa ?- At baseline ? ?Diabetes mellitus without complication ([place]) ?- Jardiance has not been resumed ?- Insulin SSI with agents coverage for obese/insulin resistant dosing ordered.  He does have good control as A1c 2 months ago at 6.5 ? ?Morbid obesity with BMI of 50.0-59.9, adult ([place]) ?Meets  criteria BMI at 56 ? ? ? ? ? ? ?Body mass index is 56.38 kg/m?.  ?  ?   ? ?Consultants: ?None ? ?Procedures: ?Echocardiogram ordered ? ?Antimicrobials: ?IV vancomycin and cefepime 5/4-present ? ?Code Status: Full code ? ? ?Subjective: Patient still feels rough, short of breath, cough and feverish ? ?Objective: ?Noted fever, mild tachycardia and tachypnea ?Vitals:  ? 01/01/22 1000 01/01/22 1300  ?BP: (!) 139/91 132/90  ?Pulse: 89 (!) 101  ?Resp: (!) 22 20  ?Temp: 99.2 ?F (37.3 ?C) 98.6 ?F (37 ?C)  ?SpO2: 97% 94%  ? ? ?Intake/Output Summary (Last 24 hours) at 01/01/2022 2003 ?Last data filed at 01/01/2022 1200 ?Gross per 24 hour  ?Intake --  ?Output 250 ml  ?Net -250 ml  ? ?Filed Weights  ? 12/31/21 1809  ?Weight: (!) 163.3 kg  ? ?Body mass index is 56.38 kg/m?. ? ?Exam: ? ?General: Alert and oriented x3, looks ill ?HEENT: Normocephalic, atraumatic, mucous membranes are slightly dry, narrow airway ?Cardiovascular: Regular rhythm, mildly tachycardic ?Respiratory: Decreased breath sounds throughout secondary to body habitus ?Abdomen: Soft, obese, nontender, positive bowel sounds ?Musculoskeletal: No clubbing or cyanosis, trace pitting edema ?Skin: No skin breaks, tears or lesions ?Psychiatry: Appropriate, no evidence of psychoses ?Neurology: No focal deficits ? ?Data Reviewed: ?Noted resolved lactic acid, improving procalcitonin, normal BNP ? ?Disposition:  ?Status is: Inpatient ?Remains inpatient appropriate because: Stabilization ?  ? ?Anticipated discharge date: 5/8 ? ?Remaining issues to be resolved so that patient can be discharged: Evaluation for diastolic heart failure, resolution of hypoxia, full stabilization of infection ? ? ?Family Communication: Left message for daughter ?DVT Prophylaxis: ?Place TED hose Start: 12/31/21 2007 ?rivaroxaban (XARELTO) tablet 20 mg  ? ? ?Author: ?Annita Brod ,MD ?01/01/2022 8:03 PM ? ?To reach On-call, see care teams to locate the attending and reach out via  [email or web address]. ?Between 7PM-7AM, please contact night-coverage ?If you still have difficulty reaching the attending provider, please page the [facility] (Director on Call) for [facility] on amion for assistance. ? ?

## 2022-01-01 NOTE — ED Notes (Signed)
Report received from Matthew, RN  

## 2022-01-01 NOTE — Consult Note (Signed)
Pharmacy Antibiotic Note ? ?Ryan [name] is a 51 y.o. male admitted on 12/31/2021 with cough and fever.  Pharmacy has been consulted for Vancomycin and Cefepime dosing. Patient received one dose of Metronidazole in ED. ? ?-?unknown sepsis source ? ?Plan: ?Vancomycin 2g IV x 1 as loading dose, followed by 2g IV Q 24 hrs. Goal AUC 400-550. ?Expected AUC: 474.5 ?Expected Css: 11.1 ?SCr used: 1.42 ? ?Cefepime 2g IV Q 8 hours ? ? ?Height: 5\' 7"  (170.2 cm) ?Weight: (!) 163.3 kg (360 lb) ?IBW/kg (Calculated) : 66.1 ? ?Temp (24hrs), Avg:100.7 ?F (38.2 ?C), Min:98.6 ?F (37 ?C), Max:103.1 ?F (39.5 ?C) ? ?Recent Labs  ?Lab 12/31/21 ?1828 12/31/21 ?2046 01/01/22 ?03/03/22  ?WBC 9.4  --   --   ?CREATININE 1.42*  --  1.44*  ?LATICACIDVEN 2.6* 1.6  --   ? ?  ?Estimated Creatinine Clearance: 89.1 mL/min (A) (by C-G formula based on SCr of 1.44 mg/dL (H)).   ? ?Allergies  ?Allergen Reactions  ? Iodinated Contrast Media Other (See Comments)  ?  Kidney failure requiring dialysis in 2013. He has had cta for PE since 2013.  ?  ? Metrizamide Other (See Comments)  ?  Kidney failure requiring dialysis ?Kidney failure requiring dialysis ?  ? Clindamycin/Lincomycin Rash  ? Lincomycin Rash  ? Sulfa Antibiotics Rash  ? ? ?Antimicrobials this admission: ?Vanc 5/4 >>  ?CFP 5/4 >> ?Flagyl 5/4 x1 ? ?Dose adjustments this admission: ?N/a ? ?Microbiology results: ?5/4 BCx: NG<12 ?5/4 UCx: sent  ?5/4 MRSA PCR: neg ? ?Thank you for allowing pharmacy to be a part of this patient?s care. ? ?Aaric Dolph A ?01/01/2022 4:21 PM ? ?

## 2022-01-02 ENCOUNTER — Inpatient Hospital Stay (HOSPITAL_COMMUNITY)
Admit: 2022-01-02 | Discharge: 2022-01-02 | Disposition: A | Discharge status: Home / Self Care | Payer: No Typology Code available for payment source | Attending: Internal Medicine | Admitting: Internal Medicine

## 2022-01-02 ENCOUNTER — Inpatient Hospital Stay: Payer: No Typology Code available for payment source

## 2022-01-02 DIAGNOSIS — J189 Pneumonia, unspecified organism: Secondary | ICD-10-CM | POA: Diagnosis not present

## 2022-01-02 DIAGNOSIS — J9601 Acute respiratory failure with hypoxia: Secondary | ICD-10-CM | POA: Diagnosis not present

## 2022-01-02 DIAGNOSIS — I5032 Chronic diastolic (congestive) heart failure: Secondary | ICD-10-CM

## 2022-01-02 DIAGNOSIS — A419 Sepsis, unspecified organism: Secondary | ICD-10-CM | POA: Diagnosis not present

## 2022-01-02 LAB — GLUCOSE, CAPILLARY
Glucose-Capillary: 130 mg/dL — ABNORMAL HIGH (ref 70–99)
Glucose-Capillary: 111 mg/dL — ABNORMAL HIGH (ref 70–99)
Glucose-Capillary: 117 mg/dL — ABNORMAL HIGH (ref 70–99)

## 2022-01-02 LAB — COMPREHENSIVE METABOLIC PANEL
ALT: 25 U/L (ref 0–44)
AST: 21 U/L (ref 15–41)
Albumin: 3.4 g/dL — ABNORMAL LOW (ref 3.5–5.0)
Alkaline Phosphatase: 45 U/L (ref 38–126)
Anion gap: 7 (ref 5–15)
BUN: 15 mg/dL (ref 6–20)
CO2: 23 mmol/L (ref 22–32)
Calcium: 8.6 mg/dL — ABNORMAL LOW (ref 8.9–10.3)
Chloride: 105 mmol/L (ref 98–111)
Creatinine, Ser: 1.41 mg/dL — ABNORMAL HIGH (ref 0.61–1.24)
GFR, Estimated: 60 mL/min — ABNORMAL LOW (ref >60)
Glucose, Bld: 105 mg/dL — ABNORMAL HIGH (ref 70–99)
Potassium: 4 mmol/L (ref 3.5–5.1)
Sodium: 135 mmol/L (ref 135–145)
Total Bilirubin: 0.9 mg/dL (ref 0.3–1.2)
Total Protein: 7.5 g/dL (ref 6.5–8.1)

## 2022-01-02 LAB — URINE CULTURE

## 2022-01-02 LAB — ECHOCARDIOGRAM COMPLETE
AR max vel: 3.3 cm2
AV Area VTI: 3.21 cm2
AV Area mean vel: 3.26 cm2
AV Mean grad: 7 mmHg
AV Peak grad: 11 mmHg
Ao pk vel: 1.66 m/s
Area-P 1/2: 3.93 cm2
Calc EF: 62.9 %
Height: 67 in
Single Plane A2C EF: 55.4 %
Single Plane A4C EF: 68.6 %
Weight: 5760 oz

## 2022-01-02 LAB — PROCALCITONIN: Procalcitonin: 1.3 ng/mL

## 2022-01-02 LAB — LACTIC ACID, PLASMA: Lactic Acid, Venous: 1.1 mmol/L (ref 0.5–1.9)

## 2022-01-02 LAB — CBG MONITORING, ED: Glucose-Capillary: 104 mg/dL — ABNORMAL HIGH (ref 70–99)

## 2022-01-02 MED ORDER — PERFLUTREN LIPID MICROSPHERE
1.0000 mL | INTRAVENOUS | Status: AC | PRN
  Administered 2022-01-02: 4 mL via INTRAVENOUS
  Filled 2022-01-02: qty 10

## 2022-01-02 MED ORDER — DOCUSATE SODIUM 100 MG PO CAPS
100.0000 mg | ORAL_CAPSULE | Freq: Two times a day (BID) | ORAL | Status: DC
  Administered 2022-01-02: 100 mg via ORAL
  Filled 2022-01-02 (×3): qty 1

## 2022-01-02 NOTE — ED Notes (Signed)
Patient is sleeping, respirations are even and unlabored. Pt is diaphoretic due to breaking his fever after being administered tylenol awhile ago. VS are stable. No needs at this time. ?

## 2022-01-02 NOTE — Progress Notes (Signed)
[facility] Progress Note ? ?Patient: Ryan [name]    [id number]  DOA: 12/31/2021    ?Date of Service: the patient was seen and examined on 01/02/2022 ? 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  He has been on Xarelto ever since and compliant.  He presented to the emergency room on 5/4 with 1 day of shortness of breath, fever and chills.  In the emergency room, found to have severe sepsis with a lactic acid level initially of 2.6.  Interestingly, initial procalcitonin level normal at less than 10, however repeat level this morning elevated at 1.71.  CT scan negative for PE, but also did not show any signs of infiltrate.  Patient also found to be hypoxic requiring 4 L nasal cannula. ? ?In addition, patient in the last 1 to 2 days complaining of some right calf pain especially when stretching his leg.  Following admission, lower extremity Dopplers done and patient found to have bilateral nonocclusive thrombus, however this looks to be subacute versus chronic. ? ?By 5/6, patient much improved, no longer tachycardic and has been more than 24 hours since he has spiked a fever.  Oxygenation much improved and he is currently at 98% on 2 L. ? ?Assessment and Plan: ?Assessment and Plan: ?* Severe sepsis (HCC)-resolved as of 01/02/2022 ?Patient meets criteria for severe sepsis on admission given lactic acidosis, tachypnea, tachycardia and fever with respiratory failure and pneumonia source.  Lactic acidosis did resolve with IV fluids although patient still with tachycardia some fever and tachypnea.  However by 5/6, patient much improved.  Procalcitonin continues to trend downward.  Sepsis has since resolved ? ?Acute respiratory failure with  hypoxia ([name]) ?Secondary to pneumonia.  No evidence of PE on CT.  Should improve over time.  BNP normal although with his morbid obesity, we will certainly check echocardiogram to rule out diastolic heart failure.  Continues to improve, currently down to 2 L on nasal cannula ? ?CAP (community acquired pneumonia) ?Given his significant hypoxia and sepsis, continue cefepime although we will discontinue vancomycin..  Procalcitonin level continue to improve. ? ?DVT (deep venous thrombosis) ([place]) ?Nonocclusive bilateral thrombus seen on bilateral lower extremities.  However, these look to be subacute versus chronic.  This is the cause of patient's calf pain.  However I do not think that this is failure of Xarelto.  Patient was last seen for 2-1/2 months ago and at that time for PE which was secondary to unable to get insurance coverage for Xarelto so for a time, he was not on any anticoagulation.  This led to PE requiring vascular intervention.  At that time, patient did not have lower extremity Dopplers done.  He since then however has been on Xarelto which would account therefore for no PE, and likely this is left over thrombus from them.  He has a previous history of IVC filter x2, but these are over 80 years old. ? ?HTN (hypertension) ?- Per med reconciliation patient is noncompliant with his blood pressure medicine and has not taken it at home in the last few months.  Blood pressures initially quite high on admission at 180/100, but has quickly improved. ?- I have resumed amlodipine 10 mg for day of admission and  metoprolol succinate 25 mg daily ?- Labetalol 5 mg IV every 2 hours as needed for SBP greater than 175, 4 doses ordered ? ?Hepatic steatosis ?Noted on CT.  Patient is aware of this issue and it is being followed.  Monitor LFTs. ? ?CKD (chronic kidney disease), stage IIIa ?- At baseline ? ?Diabetes mellitus without complication ([place]) ?- Jardiance has not been resumed ?- Insulin SSI with agents coverage for  obese/insulin resistant dosing ordered.  He does have good control as A1c 2 months ago at 6.5 ? ?Morbid obesity with BMI of 50.0-59.9, adult ([place]) ?Meets criteria BMI at 56 ? ? ? ? ? ? ?Body mass index is 56.38 kg/m?.  ?  ?   ? ?Consultants: ?None ? ?Procedures: ?Echocardiogram done 5/6: Results pending ? ?Antimicrobials: ?IV vancomycin 5/4-5/6 ?IV cefepime 5/4-present ? ?Code Status: Full code ? ? ?Subjective: Patient feeling better, less short of breath and a little more energy. ? ?Objective: ?Less tachypnea and tachycardia, afebrile ?Vitals:  ? 01/02/22 1000 01/02/22 1246  ?BP: (!) 150/93 140/78  ?Pulse: (!) 49 87  ?Resp: (!) 21   ?Temp: 98.9 ?F (37.2 ?C) 98.6 ?F (37 ?C)  ?SpO2: 96% 98%  ? ? ?Intake/Output Summary (Last 24 hours) at 01/02/2022 1327 ?Last data filed at 01/02/2022 [id number] ?Gross per 24 hour  ?Intake 2068.23 ml  ?Output 800 ml  ?Net 1268.23 ml  ? ? ?Filed Weights  ? 12/31/21 1809  ?Weight: (!) 163.3 kg  ? ?Body mass index is 56.38 kg/m?. ? ?Exam: ? ?General: Alert and oriented x3, no acute distress ?HEENT: Normocephalic, atraumatic, mucous membranes are slightly dry, narrow airway ?Cardiovascular: Regular rhythm, borderline bradycardia ?Respiratory: Decreased breath sounds throughout secondary to body habitus ?Abdomen: Soft, obese, nontender, positive bowel sounds ?Musculoskeletal: No clubbing or cyanosis, trace pitting edema ?Skin: No skin breaks, tears or lesions ?Psychiatry: Appropriate, no evidence of psychoses ?Neurology: No focal deficits ? ?Data Reviewed: ?Noted improving procalcitonin ? ?Disposition:  ?Status is: Inpatient ?Remains inpatient appropriate because: Stabilization ?  ? ?Anticipated discharge date: 5/8 ? ?Remaining issues to be resolved so that patient can be discharged: Evaluation for diastolic heart failure, weaning off of oxygen ? ? ?Family Communication: Left message for daughter ?DVT Prophylaxis: ?Place TED hose Start: 12/31/21 2007 ?rivaroxaban (XARELTO) tablet 20 mg   ? ? ?Author: ?Annita Brod ,MD ?01/02/2022 1:27 PM ? ?To reach On-call, see care teams to locate the attending and reach out via [email or web address]. ?Between 7PM-7AM, please contact night-coverage ?If you still have difficulty reaching the attending provider, please page the [facility] (Director on Call) for [facility] on amion for assistance. ? ?

## 2022-01-02 NOTE — ED Notes (Addendum)
Blood draw attempt x 3 right upper arm and right hand with success on the CBC. Labeled and sent to lab. Called lab to have a phlebotomist come to [facility] and draw CMP and Procalcitonin. ?

## 2022-01-03 DIAGNOSIS — A419 Sepsis, unspecified organism: Secondary | ICD-10-CM | POA: Diagnosis not present

## 2022-01-03 DIAGNOSIS — N1831 Chronic kidney disease, stage 3a: Secondary | ICD-10-CM

## 2022-01-03 DIAGNOSIS — I5032 Chronic diastolic (congestive) heart failure: Secondary | ICD-10-CM | POA: Diagnosis not present

## 2022-01-03 DIAGNOSIS — I825Z3 Chronic embolism and thrombosis of unspecified deep veins of distal lower extremity, bilateral: Secondary | ICD-10-CM | POA: Diagnosis not present

## 2022-01-03 LAB — GLUCOSE, CAPILLARY
Glucose-Capillary: 118 mg/dL — ABNORMAL HIGH (ref 70–99)
Glucose-Capillary: 108 mg/dL — ABNORMAL HIGH (ref 70–99)
Glucose-Capillary: 102 mg/dL — ABNORMAL HIGH (ref 70–99)
Glucose-Capillary: 118 mg/dL — ABNORMAL HIGH (ref 70–99)

## 2022-01-03 LAB — PROCALCITONIN: Procalcitonin: 1.18 ng/mL

## 2022-01-03 MED ORDER — FUROSEMIDE 10 MG/ML IJ SOLN
20.0000 mg | Freq: Two times a day (BID) | INTRAMUSCULAR | Status: DC
  Administered 2022-01-03 – 2022-01-04 (×2): 20 mg via INTRAVENOUS
  Filled 2022-01-03 (×2): qty 4

## 2022-01-03 NOTE — TOC CM/SW Note (Signed)
?  Transition of Care (TOC) Screening Note ? ? ?Patient Details  ?Name: Ryan [name] ?Date of Birth: October 29, 1970 ? ? ?Transition of Care (TOC) CM/SW Contact:    ?Emanuella Nickle E Heather Mckendree, LCSW ?Phone Number: ?01/03/2022, 2:14 PM ? ? ? ?Transition of Care Department [facility]) has reviewed patient and no TOC needs have been identified at this time. We will continue to monitor patient advancement through interdisciplinary progression rounds. If new patient transition needs arise, please place a TOC consult. ? ? ?

## 2022-01-03 NOTE — Assessment & Plan Note (Addendum)
Grade 2 noted on echocardiogram.  Although patient was dry coming in, he was aggressively fluid resuscitated given sepsis.  Patient started on IV Lasix on 5/7 and by 5/8 had diuresed over 3 L.  At this point, felt to be euvolemic.  Long-term, he is already on an ARB and beta-blocker.  Have increased his spironolactone. ?

## 2022-01-03 NOTE — Progress Notes (Addendum)
[facility] Progress Note ? ?Patient: Ryan [name]    [id number]  DOA: 12/31/2021    ?Date of Service: the patient was seen and examined on 01/03/2022 ? 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  He has been on Xarelto ever since and compliant.  He presented to the emergency room on 5/4 with 1 day of shortness of breath, fever and chills.  In the emergency room, found to have severe sepsis with a lactic acid level initially of 2.6.  Interestingly, initial procalcitonin level normal at less than 10, however repeat level this morning elevated at 1.71.  CT scan negative for PE, but also did not show any signs of infiltrate.  Patient also found to be hypoxic requiring 4 L nasal cannula. ? ?In addition, patient in the last 1 to 2 days complaining of some right calf pain especially when stretching his leg.  Following admission, lower extremity Dopplers done and patient found to have bilateral nonocclusive thrombus, however this looks to be subacute versus chronic. ? ?By 5/6, patient much improved, no longer tachycardic and has been more than 24 hours since he has spiked a fever.  Oxygenation much improved and he is currently at 94% on 1 L.  Echocardiogram notes grade 2 diastolic dysfunction. ? ?Assessment and Plan: ?Assessment and Plan: ?* Severe sepsis (HCC)-resolved as of 01/02/2022 ?Patient meets criteria for severe sepsis on admission given lactic acidosis, tachypnea, tachycardia and fever with respiratory failure and pneumonia source.  Lactic acidosis did resolve with IV fluids although patient still with tachycardia some fever and tachypnea.  However by 5/6, patient much improved.  Procalcitonin continues to trend downward.  Sepsis has  since resolved ? ?Acute respiratory failure with hypoxia ([place]) ?Secondary to pneumonia.  No evidence of PE on CT.  Should improve over time.  BNP normal although with his morbid obesity, we will certainly check echocardiogram to rule out diastolic heart failure.  Continues to improve, currently down to 2 L on nasal cannula ? ?CAP (community acquired pneumonia) ?Given his significant hypoxia and sepsis, continue cefepime although we will discontinue vancomycin..  Procalcitonin level continue to improve. ? ?Chronic diastolic CHF (congestive heart failure) ([place]) ?Grade 2 noted on echocardiogram.  Although patient was dry coming in, he was aggressively fluid resuscitated given sepsis.  We will stop IV fluids and start some gentle IV Lasix and see how much he puts out. ? ?DVT (deep venous thrombosis) ([place]) ?Nonocclusive bilateral thrombus seen on bilateral lower extremities.  However, these look to be subacute versus chronic.  This is the cause of patient's calf pain.  However I do not think that this is failure of Xarelto.  Patient was last seen for 2-1/2 months ago and at that time for PE which was secondary to unable to get insurance coverage for Xarelto so for a time, he was not on any anticoagulation.  This led to PE requiring vascular intervention.  At that time, patient did not have lower extremity Dopplers done.  He since then however has been on Xarelto which would account therefore for no PE, and likely this is left over thrombus from them.  He has a previous history of IVC filter x2, but these are over 58 years old. ? ?  HTN (hypertension) ?- Per med reconciliation patient is noncompliant with his blood pressure medicine and has not taken it at home in the last few months.  Blood pressures initially quite high on admission at 180/100, but has quickly improved. ?- I have resumed amlodipine 10 mg for day of admission and metoprolol succinate 25 mg daily ?- Labetalol 5 mg IV every 2 hours as needed for SBP greater  than 175, 4 doses ordered ? ?Hepatic steatosis ?Noted on CT.  Patient is aware of this issue and it is being followed.  Monitor LFTs. ? ?CKD (chronic kidney disease), stage IIIa ?- At baseline ? ?Diabetes mellitus without complication ([place]) ?- Jardiance has not been resumed ?- Insulin SSI with agents coverage for obese/insulin resistant dosing ordered.  He does have good control as A1c 2 months ago at 6.5 ? ?Morbid obesity with BMI of 50.0-59.9, adult ([place]) ?Meets criteria BMI at 56 ? ? ? ? ? ? ?Body mass index is 56.38 kg/m?.  ?  ?   ? ?Consultants: ?None ? ?Procedures: ?Echocardiogram done 5/6: Grade 2 diastolic dysfunction ? ?Antimicrobials: ?IV vancomycin 5/4-5/6 ?IV cefepime 5/4-present ? ?Code Status: Full code ? ? ?Subjective: Patient continues to feel better, still fatigued although breathing easier ? ?Objective: ?Less tachypnea and tachycardia, afebrile ?Vitals:  ? 01/03/22 0819 01/03/22 0900  ?BP: (!) 132/92   ?Pulse: 86   ?Resp:  20  ?Temp: 98.8 ?F (37.1 ?C)   ?SpO2: 94%   ? ? ?Intake/Output Summary (Last 24 hours) at 01/03/2022 1421 ?Last data filed at 01/03/2022 1406 ?Gross per 24 hour  ?Intake 920 ml  ?Output 2200 ml  ?Net -1280 ml  ? ?Filed Weights  ? 12/31/21 1809  ?Weight: (!) 163.3 kg  ? ?Body mass index is 56.38 kg/m?. ? ?Exam: ? ?General: Alert and oriented x3, no acute distress ?HEENT: Normocephalic, atraumatic, mucous membranes are slightly dry, narrow airway ?Cardiovascular: Regular rhythm, borderline bradycardia ?Respiratory: Decreased breath sounds throughout secondary to body habitus ?Abdomen: Soft, obese, nontender, positive bowel sounds ?Musculoskeletal: No clubbing or cyanosis, trace pitting edema ?Skin: No skin breaks, tears or lesions ?Psychiatry: Appropriate, no evidence of psychoses ?Neurology: No focal deficits ? ?Data Reviewed: ?Noted improving procalcitonin, echocardiogram results ? ?Disposition:  ?Status is: Inpatient ?Remains inpatient appropriate because: Stabilization ?   ? ?Anticipated discharge date: 5/8 ? ?Remaining issues to be resolved so that patient can be discharged: Check ambulatory pulse ox, diuresing ? ? ?Family Communication: Left message for daughter ?DVT Prophylaxis: ?Place TED hose Start: 12/31/21 2007 ?rivaroxaban (XARELTO) tablet 20 mg  ? ? ?Author: ?Annita Brod ,MD ?01/03/2022 2:21 PM ? ?To reach On-call, see care teams to locate the attending and reach out via [email or web address]. ?Between 7PM-7AM, please contact night-coverage ?If you still have difficulty reaching the attending provider, please page the [facility] (Director on Call) for [facility] on amion for assistance. ? ?

## 2022-01-03 NOTE — Progress Notes (Signed)
Mobility Specialist - Progress Note ? ? ? 01/03/22 1400  ?Mobility  ?Activity Ambulated independently in hallway;Stood at bedside;Dangled on edge of bed;Transferred from chair to bed;Transferred from bed to chair  ?Level of Assistance Independent  ?Assistive Device None  ?Distance Ambulated (ft) 200 ft  ?Activity Response Tolerated well  ?$Mobility charge 1 Mobility  ? ? ?Pre-mobility:93 HR, 91% SpO2 ?During mobility: 103-112HR, BP, 88-93% SpO2 ?Post-mobility: 94 HR, BP,88-91%  SPO2 ? ?Pt semi supine upon arrival using RA and remains on RA throughout session. Completes bed mobility ModI + extra time and STS indep. Pt ambulates 225ft with O2 stats above -- voices no complaints, mild SOB noted on return. Pt is left in bed with needs in reach. ? ?Merrily Brittle ?Mobility Specialist ?01/03/22, 2:53 PM ? ? ? ?

## 2022-01-04 DIAGNOSIS — A419 Sepsis, unspecified organism: Secondary | ICD-10-CM | POA: Diagnosis not present

## 2022-01-04 DIAGNOSIS — I5032 Chronic diastolic (congestive) heart failure: Secondary | ICD-10-CM | POA: Insufficient documentation

## 2022-01-04 DIAGNOSIS — J9601 Acute respiratory failure with hypoxia: Secondary | ICD-10-CM | POA: Diagnosis not present

## 2022-01-04 DIAGNOSIS — J189 Pneumonia, unspecified organism: Secondary | ICD-10-CM | POA: Diagnosis not present

## 2022-01-04 DIAGNOSIS — I5033 Acute on chronic diastolic (congestive) heart failure: Secondary | ICD-10-CM | POA: Diagnosis not present

## 2022-01-04 LAB — PROCALCITONIN: Procalcitonin: 0.5 ng/mL

## 2022-01-04 LAB — BASIC METABOLIC PANEL
Anion gap: 7 (ref 5–15)
BUN: 19 mg/dL (ref 6–20)
CO2: 26 mmol/L (ref 22–32)
Calcium: 8.5 mg/dL — ABNORMAL LOW (ref 8.9–10.3)
Chloride: 104 mmol/L (ref 98–111)
Creatinine, Ser: 1.33 mg/dL — ABNORMAL HIGH (ref 0.61–1.24)
GFR, Estimated: >60 mL/min (ref >60)
Glucose, Bld: 102 mg/dL — ABNORMAL HIGH (ref 70–99)
Potassium: 3.9 mmol/L (ref 3.5–5.1)
Sodium: 137 mmol/L (ref 135–145)

## 2022-01-04 LAB — GLUCOSE, CAPILLARY
Glucose-Capillary: 105 mg/dL — ABNORMAL HIGH (ref 70–99)
Glucose-Capillary: 116 mg/dL — ABNORMAL HIGH (ref 70–99)

## 2022-01-04 MED ORDER — SPIRONOLACTONE 50 MG PO TABS: 25.0000 mg | ORAL_TABLET | Freq: Every day | ORAL | 1 refills | Status: DC

## 2022-01-04 MED ORDER — AMOXICILLIN-POT CLAVULANATE 875-125 MG PO TABS: 1.0000 | ORAL_TABLET | Freq: Two times a day (BID) | ORAL | 0 refills | Status: AC

## 2022-01-04 NOTE — Discharge Summary (Signed)
?Physician Discharge Summary ?  ?Patient: Ryan [name] MRN: [id number] DOB: 07-24-1971  ?Admit date:     12/31/2021  ?Discharge date: 01/04/22  ?Discharge Physician: Annita Brod  ? ?PCP: Jon Billings, NP  ? ?Recommendations at discharge:  ? ?New medication: Augmentin 875 p.o. twice daily x3 days ?Medication change: Spironolactone increased to 50 mg p.o. daily ? ?Discharge Diagnoses: ?Active Problems: ?  DVT (deep venous thrombosis) ([place]) ?  HTN (hypertension) ?  CKD (chronic kidney disease), stage IIIa ?  Hepatic steatosis ?  Diabetes mellitus without complication ([place]) ?  Morbid obesity with BMI of 50.0-59.9, adult ([place]) ? ?Principal Problem (Resolved): ?  Severe sepsis ([place]) ?Resolved Problems: ?  Acute respiratory failure with hypoxia ([place]) ?  Acute on chronic diastolic CHF (congestive heart failure) ([place]) ?  CAP (community acquired pneumonia) ? 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  He has been on Xarelto ever since and compliant.  He presented to the emergency room on 5/4 with 1 day of shortness of breath, fever and chills.  In the emergency room, found to have severe sepsis with a lactic acid level initially of 2.6.  Interestingly, initial procalcitonin level normal at less than 10, however repeat level this morning elevated at 1.71.  CT scan negative for PE, but also did not show any signs of infiltrate.  Patient also found to be hypoxic requiring 4 L nasal cannula. ? ?In addition, patient in the last 1 to 2 days complaining of some right calf pain especially when stretching his leg.  Following admission, lower extremity Dopplers done and patient found to have bilateral nonocclusive thrombus, however this looks to be subacute  versus chronic. ? ?By 5/6, patient much improved, no longer tachycardic and has been more than 24 hours since he has spiked a fever.  Oxygenation much improved and he is currently at 94% on 1 L.  Echocardiogram notes grade 2 diastolic dysfunction.  Patient given IV Lasix and diuresed almost 3 L.  By 5/8, he was able to ambulate on room air comfortably. ? ?Assessment and Plan: ?* Severe sepsis (HCC)-resolved as of 01/02/2022 ?Patient meets criteria for severe sepsis on admission given lactic acidosis, tachypnea, tachycardia and fever with respiratory failure and pneumonia source.  Lactic acidosis did resolve with IV fluids although patient still with tachycardia some fever and tachypnea.  However by 5/6, patient much improved.  Procalcitonin continues to trend downward.  Sepsis has since resolved ? ?Acute respiratory failure with hypoxia (HCC)-resolved as of 01/04/2022 ?Secondary to pneumonia.  No evidence of PE on CT.  Should improve over time.  BNP normal although with his morbid obesity, we will certainly check echocardiogram to rule out diastolic heart failure.  Continues to improve, currently down to 2 L on nasal cannula ? ?CAP (community acquired pneumonia)-resolved as of 01/04/2022 ?Given his significant hypoxia and sepsis, continue cefepime although we will discontinue vancomycin..  Procalcitonin level continue to improve.  Discharged on 3 more days of p.o. Augmentin for total of 7 days of therapy. ? ?Acute on chronic diastolic CHF (congestive heart failure) (HCC)-resolved as of 01/04/2022 ?Grade 2 noted on echocardiogram.  Although patient was dry coming in, he was aggressively fluid resuscitated given sepsis.  Patient started  on IV Lasix on 5/7 and by 5/8 had diuresed over 3 L.  At this point, felt to be euvolemic.  Long-term, he is already on an ARB and beta-blocker.  Have increased his spironolactone. ? ?DVT (deep venous thrombosis) ([place]) ?Nonocclusive bilateral thrombus seen on bilateral lower extremities.   However, these look to be subacute versus chronic.  This is the cause of patient's calf pain.  However I do not think that this is failure of Xarelto.  Patient was last seen for 2-1/2 months ago and at that time for PE which was secondary to unable to get insurance coverage for Xarelto so for a time, he was not on any anticoagulation.  This led to PE requiring vascular intervention.  At that time, patient did not have lower extremity Dopplers done.  He since then however has been on Xarelto which would account therefore for no PE, and likely this is left over thrombus from them.  He has a previous history of IVC filter x2, but these are over 65 years old. ? ?HTN (hypertension) ?- Per med reconciliation patient is noncompliant with his blood pressure medicine and has not taken it at home in the last few months.  Blood pressures initially quite high on admission at 180/100, but has quickly improved. ?- I have resumed amlodipine 10 mg for day of admission and metoprolol succinate 25 mg daily ?- Labetalol 5 mg IV every 2 hours as needed for SBP greater than 175, 4 doses ordered ? ?Hepatic steatosis ?Noted on CT.  Patient is aware of this issue and it is being followed.  Monitor LFTs. ? ?CKD (chronic kidney disease), stage IIIa ?- At baseline ? ?Diabetes mellitus without complication ([place]) ?- Jardiance has not been resumed ?- Insulin SSI with agents coverage for obese/insulin resistant dosing ordered.  He does have good control as A1c 2 months ago at 6.5 ? ?Morbid obesity with BMI of 50.0-59.9, adult ([place]) ?Meets criteria BMI at 56 ? ? ? ? ?  ? ? ?Consultants: None ?Procedures performed: Echocardiogram noting grade 2 diastolic dysfunction ?Disposition: Home ?Diet recommendation:  ?Discharge Diet Orders (From admission, onward)  ? ?  Start     Ordered  ? 01/04/22 0000  Diet - low sodium heart healthy       ? 01/04/22 1541  ? ?  ?  ? ?  ? ?Cardiac and Carb modified diet ?DISCHARGE MEDICATION: ?Allergies as of 01/04/2022    ? ?   Reactions  ? Iodinated Contrast Media Other (See Comments)  ? Kidney failure requiring dialysis in 2013. He has had cta for PE since 2013.   ? Metrizamide Other (See Comments)  ? Kidney failure requiring dialysis ?Kidney failure requiring dialysis  ? Clindamycin/lincomycin Rash  ? Lincomycin Rash  ? Sulfa Antibiotics Rash  ? ?  ? ?  ?Medication List  ?  ? ?TAKE these medications   ? ?amLODipine 10 MG tablet ?Commonly known as: NORVASC ?Take 10 mg by mouth daily. ?  ?amoxicillin-clavulanate 875-125 MG tablet ?Commonly known as: Augmentin ?Take 1 tablet by mouth every 12 (twelve) hours for 3 days. ?  ?empagliflozin 25 MG Tabs tablet ?Commonly known as: Jardiance ?Take 1 tablet (25 mg total) by mouth daily before breakfast. ?  ?losartan 50 MG tablet ?Commonly known as: COZAAR ?Take 50 mg by mouth daily. ?  ?magnesium oxide 400 MG tablet ?Commonly known as: MAG-OX ?Take 1 tablet by mouth in the morning and at bedtime. ?  ?metoprolol succinate 25 MG 24  hr tablet ?Commonly known as: TOPROL-XL ?Take 1 tablet by mouth daily. ?  ?spironolactone 50 MG tablet ?Commonly known as: ALDACTONE ?Take 0.5 tablets (25 mg total) by mouth daily. ?What changed: medication strength ?  ?Xarelto 20 MG Tabs tablet ?Generic drug: rivaroxaban ?Take 20 mg by mouth daily. ?  ? ?  ? ? ?Discharge Exam: ?Danley Danker Weights  ? 12/31/21 1809  ?Weight: (!) 163.3 kg  ? ?General: Alert and oriented x3, no acute distress ?Cardiovascular: Regular rate and rhythm, S1-S2 ?Lungs: Clear to auscultation bilaterally ? ?Condition at discharge: good ? ?The results of significant diagnostics from this hospitalization (including imaging, microbiology, ancillary and laboratory) are listed below for reference.  ? ?Imaging Studies: ?CT Angio Chest Pulmonary Embolism (PE) W or WO Contrast ? ?Result Date: 12/31/2021 ?CLINICAL DATA:  Concern for pulmonary embolism. EXAM: CT ANGIOGRAPHY CHEST WITH CONTRAST TECHNIQUE: Multidetector CT imaging of the chest was performed  using the standard protocol during bolus administration of intravenous contrast. Multiplanar CT image reconstructions and MIPs were obtained to evaluate the vascular anatomy. RADIATION DOSE REDUCTION: This exam

## 2022-01-04 NOTE — Progress Notes (Signed)
Mobility Specialist - Progress Note ? ? 01/04/22 1300  ?Mobility  ?Activity Ambulated with assistance in hallway;Stood at bedside;Dangled on edge of bed  ?Level of Assistance Modified independent, requires aide device or extra time  ?Assistive Device None  ?Distance Ambulated (ft) 200 ft  ?Activity Response Tolerated well  ?$Mobility charge 1 Mobility  ? ? ?Pre-mobility standing: 96 HR, 90% SpO2 on RA ?During mobility: 112-120 HR, 87-93% SpO2 on RA ?Post-mobility: 86 HR, 93% SPO2 on RA ? ?Pt semi supine upon arrival using RA. Pt completes all activities ModI and shows no signs of distress throughout. Ambulates 236ft voicing no complaints. Denies chest pain and returns supine with needs in reach and RN notified. ? ?Clarisa Schools ?Mobility Specialist ?01/04/22, 1:54 PM ? ? ?

## 2022-01-04 NOTE — Plan of Care (Signed)
AVS and education provided. IV(s) removed with no complications. VS are stable. Pt transported by [facility] to main lobby where a friend will transport him home. ?

## 2022-01-05 LAB — CULTURE, BLOOD (ROUTINE X 2)
Culture: NO GROWTH
Culture: NO GROWTH

## 2022-01-06 ENCOUNTER — Telehealth: Payer: Self-pay | Admitting: *Deleted

## 2022-01-06 NOTE — Telephone Encounter (Signed)
Transition Care Management Unsuccessful Follow-up Telephone Call ? ?Date of discharge and from where:  [facility] ? ?Attempts:  2nd Attempt ? ?Reason for unsuccessful TCM follow-up call:  NO ANSWER ? ?  ?

## 2022-01-21 ENCOUNTER — Telehealth: Payer: Self-pay | Admitting: Nurse Practitioner

## 2022-01-21 NOTE — Telephone Encounter (Signed)
LVM asking patient to call back to schedule an appointment 

## 2022-01-21 NOTE — Telephone Encounter (Signed)
Patient brought in a short tern disability application to be completed by provider. Patient was told that if he needed an appoint that he would be called. Patient was last seen on 10/29/2021 and was hospitalized on 12/31/2021 .Paperwork was placed in the providers folder for completion.

## 2022-01-22 NOTE — Progress Notes (Deleted)
There were no vitals taken for this visit.   Subjective:    Patient ID: Ryan [name], male    DOB: 1970-11-07, 51 y.o.   MRN: [id number]  HPI: Ryan [name] is a 51 y.o. male  No chief complaint on file.  Patient presents to clinic to establish care with new PCP.  Introduced to Designer, jewellery role and practice setting.  All questions answered.  Discussed provider/patient relationship and expectations.    Patient reports a history of HTN, elevated cholesterol, type 2 diabetes, CKD, Afib- had an ablation (does not see Cardiology).  Patient is seeing a Hydrographic surveyor at [facility].  He does have an IVC filter.  Vascular is causing him to have decreased blood flow to his lower extremities causing him to have recent cellulitis. He is walking daily to help with venous stasis.   Patient has had multiple blood clots. He has a blood clotting disorder.  He had a lower extremity clot removed and caused him to have renal failure.  He was on dialysis for about a year then no longer needed it.  See's Dr. Glennon Mac for Hematology at [facility]   Patient denies a history of:  Thyroid problems, Depression, Anxiety, Neurological problems, and Abdominal problems.    Denies HA, CP, SOB, dizziness, palpitations, visual changes, and lower extremity swelling.  Active Ambulatory Problems    Diagnosis Date Noted  . DVT (deep venous thrombosis) ([place]) 12/28/2014  . Pulmonary emboli ([place]) 11/22/2016  . PVC (premature ventricular contraction) 11/24/2016  . 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  . HTN (hypertension) 12/15/2016  . Fatigue 12/15/2016  . Pulmonary embolus ([place]) 01/03/2017  . Diabetes mellitus without complication ([place]) 123456  . CKD (chronic kidney disease), stage IIIa 08/03/2017  . Hepatic steatosis 05/30/2013  . Knee pain 03/11/2020  . Morbid obesity with BMI of 50.0-59.9, adult ([place]) 05/30/2013  . Proteinuria 05/30/2013  . SOB (shortness of breath) 12/20/2016  . Weakness of both lower limbs  11/17/2017  . Cellulitis of right foot 06/03/2021  . Angina at rest [facility]) 07/29/2021  . Hypercholesterolemia 07/29/2021  . Sepsis ([place]) 01/02/2022  . Chronic diastolic CHF (congestive heart failure) ([place]) 01/04/2022   Resolved Ambulatory Problems    Diagnosis Date Noted  . Acute respiratory distress 11/24/2016  . Obesity, Class III, BMI 40-49.9 (morbid obesity) ([place]) 11/24/2016  . Acute on chronic diastolic CHF (congestive heart failure) ([place]) 12/15/2016  . CAP (community acquired pneumonia) 08/03/2017  . Acute respiratory failure with hypoxia ([place]) 01/01/2022  . Severe sepsis ([place]) 01/01/2022   Past Medical History:  Diagnosis Date  . CHF (congestive heart failure) ([place])   . Hypertension   . Peripheral vascular disease ([place])   . Presence of IVC filter    Past Surgical History:  Procedure Laterality Date  . CARDIAC ELECTROPHYSIOLOGY STUDY AND ABLATION  10/2020  . PERIPHERAL VASCULAR CATHETERIZATION N/A 01/10/2015   Procedure: Dialysis/Perma Catheter Insertion;  Surgeon: Katha Cabal, MD;  Location: [place] [facility];  Service: Cardiovascular;  Laterality: N/A;  . PERIPHERAL VASCULAR CATHETERIZATION N/A 02/24/2015   Procedure: Dialysis/Perma Catheter Removal;  Surgeon: Algernon Huxley, MD;  Location: [place] [facility];  Service: Cardiovascular;  Laterality: N/A;   Family History  Problem Relation Age of Onset  . Hypertension Mother   . Cancer Mother   . Breast cancer Mother   . Prostate cancer Father   . Hypertension Father   . Diabetes Father   . Pulmonary embolism Paternal Grandfather   . Pulmonary embolism  Paternal Uncle      Review of Systems  Eyes:  Negative for visual disturbance.  Respiratory:  Negative for chest tightness and shortness of breath.   Cardiovascular:  Negative for chest pain, palpitations and leg swelling.  Neurological:  Negative for dizziness, light-headedness and headaches.   Per HPI unless specifically indicated above     Objective:     There were no vitals taken for this visit.  Wt Readings from Last 3 Encounters:  12/31/21 (!) 360 lb (163.3 kg)  10/29/21 (!) 359 lb 9.6 oz (163.1 kg)  07/29/21 (!) 367 lb 9.6 oz (166.7 kg)    Physical Exam Vitals and nursing note reviewed.  Constitutional:      General: He is not in acute distress.    Appearance: Normal appearance. He is obese. He is not ill-appearing, toxic-appearing or diaphoretic.  HENT:     Head: Normocephalic.     Right Ear: External ear normal.     Left Ear: External ear normal.     Nose: Nose normal. No congestion or rhinorrhea.     Mouth/Throat:     Mouth: Mucous membranes are moist.  Eyes:     General:        Right eye: No discharge.        Left eye: No discharge.     Extraocular Movements: Extraocular movements intact.     Conjunctiva/sclera: Conjunctivae normal.     Pupils: Pupils are equal, round, and reactive to light.  Cardiovascular:     Rate and Rhythm: Normal rate and regular rhythm.     Heart sounds: No murmur heard. Pulmonary:     Effort: Pulmonary effort is normal. No respiratory distress.     Breath sounds: Normal breath sounds. No wheezing, rhonchi or rales.  Abdominal:     General: Abdomen is flat. Bowel sounds are normal.  Musculoskeletal:     Cervical back: Normal range of motion and neck supple.  Skin:    General: Skin is warm and dry.     Capillary Refill: Capillary refill takes less than 2 seconds.  Neurological:     General: No focal deficit present.     Mental Status: He is alert and oriented to person, place, and time.  Psychiatric:        Mood and Affect: Mood normal.        Behavior: Behavior normal.        Thought Content: Thought content normal.        Judgment: Judgment normal.    Results for orders placed or performed during the hospital encounter of 12/31/21  Resp Panel by RT-PCR (Flu A&B, Covid) Nasopharyngeal Swab   Specimen: Nasopharyngeal Swab; Nasopharyngeal(NP) swabs in vial transport medium  Result  Value Ref Range   SARS Coronavirus 2 by RT PCR NEGATIVE NEGATIVE   Influenza A by PCR NEGATIVE NEGATIVE   Influenza B by PCR NEGATIVE NEGATIVE  Blood Culture (routine x 2)   Specimen: BLOOD  Result Value Ref Range   Specimen Description BLOOD BLOOD RIGHT HAND    Special Requests      BOTTLES DRAWN AEROBIC AND ANAEROBIC Blood Culture results may not be optimal due to an inadequate volume of blood received in culture bottles   Culture      NO GROWTH 5 DAYS Performed at [facility], [address], [place], [place] [address]    Report Status 01/05/2022 FINAL   Blood Culture (routine x 2)   Specimen: BLOOD  Result Value  Ref Range   Specimen Description BLOOD BLH    Special Requests BOTTLES DRAWN AEROBIC AND ANAEROBIC BCLV    Culture      NO GROWTH 5 DAYS Performed at [facility], [address] [place], [place] [address]    Report Status 01/05/2022 FINAL   Urine Culture   Specimen: Urine, Random  Result Value Ref Range   Specimen Description      URINE, RANDOM Performed at [facility], [address] [place], [place] [address]    Special Requests      NONE Performed at [facility], [address] [place], [place] [address]    Culture MULTIPLE SPECIES PRESENT, SUGGEST RECOLLECTION (A)    Report Status 01/02/2022 FINAL   MRSA Next Gen by PCR, Nasal   Specimen: Nasal Mucosa; Nasal Swab  Result Value Ref Range   MRSA by PCR Next Gen NOT DETECTED NOT DETECTED  Lactic acid, plasma  Result Value Ref Range   Lactic Acid, Venous 2.6 (HH) 0.5 - 1.9 mmol/L  Lactic acid, plasma  Result Value Ref Range   Lactic Acid, Venous 1.6 0.5 - 1.9 mmol/L  Comprehensive metabolic panel  Result Value Ref Range   Sodium 140 135 - 145 mmol/L   Potassium 4.6 3.5 - 5.1 mmol/L   Chloride 106 98 - 111 mmol/L   CO2 25 22 - 32 mmol/L   Glucose, Bld 117 (H) 70 - 99 mg/dL   BUN 16 6 - 20 mg/dL   Creatinine, Ser 1.42 (H) 0.61 - 1.24 mg/dL   Calcium 9.0  8.9 - 10.3 mg/dL   Total Protein 8.3 (H) 6.5 - 8.1 g/dL   Albumin 4.1 3.5 - 5.0 g/dL   AST 28 15 - 41 U/L   ALT 34 0 - 44 U/L   Alkaline Phosphatase 59 38 - 126 U/L   Total Bilirubin 1.0 0.3 - 1.2 mg/dL   GFR, Estimated 59 (L) >60 mL/min   Anion gap 9 5 - 15  CBC with Differential  Result Value Ref Range   WBC 9.4 4.0 - 10.5 K/uL   RBC 5.11 4.22 - 5.81 MIL/uL   Hemoglobin 15.4 13.0 - 17.0 g/dL   HCT 49.1 39.0 - 52.0 %   MCV 96.1 80.0 - 100.0 fL   MCH 30.1 26.0 - 34.0 pg   MCHC 31.4 30.0 - 36.0 g/dL   RDW 14.8 11.5 - 15.5 %   Platelets 238 150 - 400 K/uL   nRBC 0.0 0.0 - 0.2 %   Neutrophils Relative % 78 %   Neutro Abs 7.3 1.7 - 7.7 K/uL   Lymphocytes Relative 13 %   Lymphs Abs 1.3 0.7 - 4.0 K/uL   Monocytes Relative 7 %   Monocytes Absolute 0.6 0.1 - 1.0 K/uL   Eosinophils Relative 2 %   Eosinophils Absolute 0.2 0.0 - 0.5 K/uL   Basophils Relative 0 %   Basophils Absolute 0.0 0.0 - 0.1 K/uL   Immature Granulocytes 0 %   Abs Immature Granulocytes 0.02 0.00 - 0.07 K/uL  Protime-INR  Result Value Ref Range   Prothrombin Time 13.4 11.4 - 15.2 seconds   INR 1.0 0.8 - 1.2  APTT  Result Value Ref Range   aPTT 25 24 - 36 seconds  Urinalysis, Complete w Microscopic  Result Value Ref Range   Color, Urine YELLOW (A) YELLOW   APPearance CLEAR (A) CLEAR   Specific Gravity, Urine 1.015 1.005 - 1.030   pH 6.0 5.0 -  8.0   Glucose, UA NEGATIVE NEGATIVE mg/dL   Hgb urine dipstick NEGATIVE NEGATIVE   Bilirubin Urine NEGATIVE NEGATIVE   Ketones, ur NEGATIVE NEGATIVE mg/dL   Protein, ur 30 (A) NEGATIVE mg/dL   Nitrite NEGATIVE NEGATIVE   Leukocytes,Ua NEGATIVE NEGATIVE   RBC / HPF 0-5 0 - 5 RBC/hpf   WBC, UA 0-5 0 - 5 WBC/hpf   Bacteria, UA NONE SEEN NONE SEEN   Squamous Epithelial / LPF 0-5 0 - 5   Mucus PRESENT   Cortisol-am, blood  Result Value Ref Range   Cortisol - AM 20.4 6.7 - 22.6 ug/dL  Procalcitonin - Baseline  Result Value Ref Range   Procalcitonin <0.10 ng/mL   Procalcitonin  Result Value Ref Range   Procalcitonin 1.71 ng/mL  Protime-INR  Result Value Ref Range   Prothrombin Time 15.4 (H) 11.4 - 15.2 seconds   INR 1.2 0.8 - 1.2  Creatinine, serum  Result Value Ref Range   Creatinine, Ser 1.44 (H) 0.61 - 1.24 mg/dL   GFR, Estimated 58 (L) >60 mL/min  Brain natriuretic peptide  Result Value Ref Range   B Natriuretic Peptide 67.8 0.0 - 100.0 pg/mL  Procalcitonin - Baseline  Result Value Ref Range   Procalcitonin 1.50 ng/mL  Lactic acid, plasma  Result Value Ref Range   Lactic Acid, Venous 0.8 0.5 - 1.9 mmol/L  Lactic acid, plasma  Result Value Ref Range   Lactic Acid, Venous 1.1 0.5 - 1.9 mmol/L  Comprehensive metabolic panel  Result Value Ref Range   Sodium 135 135 - 145 mmol/L   Potassium 4.0 3.5 - 5.1 mmol/L   Chloride 105 98 - 111 mmol/L   CO2 23 22 - 32 mmol/L   Glucose, Bld 105 (H) 70 - 99 mg/dL   BUN 15 6 - 20 mg/dL   Creatinine, Ser 1.41 (H) 0.61 - 1.24 mg/dL   Calcium 8.6 (L) 8.9 - 10.3 mg/dL   Total Protein 7.5 6.5 - 8.1 g/dL   Albumin 3.4 (L) 3.5 - 5.0 g/dL   AST 21 15 - 41 U/L   ALT 25 0 - 44 U/L   Alkaline Phosphatase 45 38 - 126 U/L   Total Bilirubin 0.9 0.3 - 1.2 mg/dL   GFR, Estimated 60 (L) >60 mL/min   Anion gap 7 5 - 15  Procalcitonin  Result Value Ref Range   Procalcitonin 1.30 ng/mL  Glucose, capillary  Result Value Ref Range   Glucose-Capillary 130 (H) 70 - 99 mg/dL  Glucose, capillary  Result Value Ref Range   Glucose-Capillary 111 (H) 70 - 99 mg/dL   Comment 1 Notify RN    Comment 2 Document in Chart   Procalcitonin  Result Value Ref Range   Procalcitonin 1.18 ng/mL  Glucose, capillary  Result Value Ref Range   Glucose-Capillary 117 (H) 70 - 99 mg/dL  Glucose, capillary  Result Value Ref Range   Glucose-Capillary 118 (H) 70 - 99 mg/dL  Glucose, capillary  Result Value Ref Range   Glucose-Capillary 108 (H) 70 - 99 mg/dL   Comment 1 Notify RN    Comment 2 Document in Chart   Glucose,  capillary  Result Value Ref Range   Glucose-Capillary 102 (H) 70 - 99 mg/dL   Comment 1 Notify RN    Comment 2 Document in Chart   Procalcitonin  Result Value Ref Range   Procalcitonin 0.50 ng/mL  Basic metabolic panel  Result Value Ref Range   Sodium 137 135 - 145  mmol/L   Potassium 3.9 3.5 - 5.1 mmol/L   Chloride 104 98 - 111 mmol/L   CO2 26 22 - 32 mmol/L   Glucose, Bld 102 (H) 70 - 99 mg/dL   BUN 19 6 - 20 mg/dL   Creatinine, Ser 1.33 (H) 0.61 - 1.24 mg/dL   Calcium 8.5 (L) 8.9 - 10.3 mg/dL   GFR, Estimated >60 >60 mL/min   Anion gap 7 5 - 15  Glucose, capillary  Result Value Ref Range   Glucose-Capillary 118 (H) 70 - 99 mg/dL  Glucose, capillary  Result Value Ref Range   Glucose-Capillary 105 (H) 70 - 99 mg/dL  Glucose, capillary  Result Value Ref Range   Glucose-Capillary 116 (H) 70 - 99 mg/dL  CBG monitoring, ED  Result Value Ref Range   Glucose-Capillary 125 (H) 70 - 99 mg/dL  CBG monitoring, ED  Result Value Ref Range   Glucose-Capillary 131 (H) 70 - 99 mg/dL  CBG monitoring, ED  Result Value Ref Range   Glucose-Capillary 113 (H) 70 - 99 mg/dL  CBG monitoring, ED  Result Value Ref Range   Glucose-Capillary 121 (H) 70 - 99 mg/dL  CBG monitoring, ED  Result Value Ref Range   Glucose-Capillary 115 (H) 70 - 99 mg/dL   Comment 1 Document in Chart   CBG monitoring, ED  Result Value Ref Range   Glucose-Capillary 104 (H) 70 - 99 mg/dL  ECHOCARDIOGRAM COMPLETE  Result Value Ref Range   Weight 5,760 oz   Height 67 in   BP 140/78 mmHg   Ao pk vel 1.66 m/s   AV Area VTI 3.21 cm2   AR max vel 3.30 cm2   AV Mean grad 7.0 mmHg   AV Peak grad 11.0 mmHg   Single Plane A2C EF 55.4 %   Single Plane A4C EF 68.6 %   Calc EF 62.9 %   AV Area mean vel 3.26 cm2   Area-P 1/2 3.93 cm2      Assessment & Plan:   Problem List Items Addressed This Visit       Cardiovascular and Mediastinum   HTN (hypertension) (Chronic)   Chronic diastolic CHF (congestive heart  failure) (HCC) - Primary (Chronic)     Endocrine   Diabetes mellitus without complication (HCC)     Genitourinary   CKD (chronic kidney disease), stage IIIa     Other   Morbid obesity with BMI of 50.0-59.9, adult ([place])   Hypercholesterolemia     Follow up plan: No follow-ups on file.

## 2022-01-25 ENCOUNTER — Emergency Department: Payer: No Typology Code available for payment source

## 2022-01-25 ENCOUNTER — Other Ambulatory Visit: Payer: Self-pay

## 2022-01-25 ENCOUNTER — Inpatient Hospital Stay
Admission: EM | Admit: 2022-01-25 | Discharge: 2022-01-28 | DRG: 871 | Disposition: A | Discharge status: Home / Self Care | Payer: No Typology Code available for payment source | Attending: Student | Admitting: Student

## 2022-01-25 DIAGNOSIS — N183 Chronic kidney disease, stage 3 unspecified: Secondary | ICD-10-CM | POA: Diagnosis present

## 2022-01-25 DIAGNOSIS — A419 Sepsis, unspecified organism: Principal | ICD-10-CM

## 2022-01-25 DIAGNOSIS — I82409 Acute embolism and thrombosis of unspecified deep veins of unspecified lower extremity: Secondary | ICD-10-CM | POA: Diagnosis present

## 2022-01-25 DIAGNOSIS — Z91041 Radiographic dye allergy status: Secondary | ICD-10-CM

## 2022-01-25 DIAGNOSIS — Z79899 Other long term (current) drug therapy: Secondary | ICD-10-CM

## 2022-01-25 DIAGNOSIS — Z9189 Other specified personal risk factors, not elsewhere classified: Secondary | ICD-10-CM

## 2022-01-25 DIAGNOSIS — Z95828 Presence of other vascular implants and grafts: Secondary | ICD-10-CM

## 2022-01-25 DIAGNOSIS — R0602 Shortness of breath: Secondary | ICD-10-CM | POA: Diagnosis not present

## 2022-01-25 DIAGNOSIS — J9601 Acute respiratory failure with hypoxia: Secondary | ICD-10-CM | POA: Diagnosis present

## 2022-01-25 DIAGNOSIS — Z833 Family history of diabetes mellitus: Secondary | ICD-10-CM

## 2022-01-25 DIAGNOSIS — Z7901 Long term (current) use of anticoagulants: Secondary | ICD-10-CM

## 2022-01-25 DIAGNOSIS — Z86718 Personal history of other venous thrombosis and embolism: Secondary | ICD-10-CM

## 2022-01-25 DIAGNOSIS — E1122 Type 2 diabetes mellitus with diabetic chronic kidney disease: Secondary | ICD-10-CM | POA: Diagnosis present

## 2022-01-25 DIAGNOSIS — Z86711 Personal history of pulmonary embolism: Secondary | ICD-10-CM | POA: Diagnosis present

## 2022-01-25 DIAGNOSIS — R6520 Severe sepsis without septic shock: Secondary | ICD-10-CM | POA: Diagnosis present

## 2022-01-25 DIAGNOSIS — I5032 Chronic diastolic (congestive) heart failure: Secondary | ICD-10-CM | POA: Diagnosis present

## 2022-01-25 DIAGNOSIS — Z888 Allergy status to other drugs, medicaments and biological substances status: Secondary | ICD-10-CM

## 2022-01-25 DIAGNOSIS — Z881 Allergy status to other antibiotic agents status: Secondary | ICD-10-CM

## 2022-01-25 DIAGNOSIS — I8290 Acute embolism and thrombosis of unspecified vein: Secondary | ICD-10-CM | POA: Diagnosis present

## 2022-01-25 DIAGNOSIS — I739 Peripheral vascular disease, unspecified: Secondary | ICD-10-CM | POA: Diagnosis present

## 2022-01-25 DIAGNOSIS — Z20822 Contact with and (suspected) exposure to covid-19: Secondary | ICD-10-CM | POA: Diagnosis present

## 2022-01-25 DIAGNOSIS — I4891 Unspecified atrial fibrillation: Secondary | ICD-10-CM | POA: Diagnosis present

## 2022-01-25 DIAGNOSIS — Z8249 Family history of ischemic heart disease and other diseases of the circulatory system: Secondary | ICD-10-CM

## 2022-01-25 DIAGNOSIS — I2699 Other pulmonary embolism without acute cor pulmonale: Secondary | ICD-10-CM | POA: Diagnosis present

## 2022-01-25 DIAGNOSIS — I130 Hypertensive heart and chronic kidney disease with heart failure and stage 1 through stage 4 chronic kidney disease, or unspecified chronic kidney disease: Secondary | ICD-10-CM | POA: Diagnosis present

## 2022-01-25 DIAGNOSIS — Z882 Allergy status to sulfonamides status: Secondary | ICD-10-CM

## 2022-01-25 DIAGNOSIS — Z6841 Body Mass Index (BMI) 40.0 and over, adult: Secondary | ICD-10-CM

## 2022-01-25 DIAGNOSIS — I10 Essential (primary) hypertension: Secondary | ICD-10-CM | POA: Diagnosis present

## 2022-01-25 DIAGNOSIS — E119 Type 2 diabetes mellitus without complications: Secondary | ICD-10-CM

## 2022-01-25 DIAGNOSIS — I2782 Chronic pulmonary embolism: Secondary | ICD-10-CM | POA: Diagnosis present

## 2022-01-25 DIAGNOSIS — Z7984 Long term (current) use of oral hypoglycemic drugs: Secondary | ICD-10-CM

## 2022-01-25 DIAGNOSIS — N1831 Chronic kidney disease, stage 3a: Secondary | ICD-10-CM | POA: Diagnosis present

## 2022-01-25 DIAGNOSIS — R5381 Other malaise: Secondary | ICD-10-CM

## 2022-01-25 LAB — URINALYSIS, COMPLETE (UACMP) WITH MICROSCOPIC
Bacteria, UA: NONE SEEN
Bilirubin Urine: NEGATIVE
Glucose, UA: >=500 mg/dL — AB
Ketones, ur: NEGATIVE mg/dL
Leukocytes,Ua: NEGATIVE
Nitrite: NEGATIVE
Protein, ur: 30 mg/dL — AB
Specific Gravity, Urine: 1.015 (ref 1.005–1.030)
pH: 6 (ref 5.0–8.0)

## 2022-01-25 LAB — COMPREHENSIVE METABOLIC PANEL
ALT: 25 U/L (ref 0–44)
AST: 22 U/L (ref 15–41)
Albumin: 3.8 g/dL (ref 3.5–5.0)
Alkaline Phosphatase: 58 U/L (ref 38–126)
Anion gap: 10 (ref 5–15)
BUN: 12 mg/dL (ref 6–20)
CO2: 24 mmol/L (ref 22–32)
Calcium: 8.4 mg/dL — ABNORMAL LOW (ref 8.9–10.3)
Chloride: 104 mmol/L (ref 98–111)
Creatinine, Ser: 1.3 mg/dL — ABNORMAL HIGH (ref 0.61–1.24)
GFR, Estimated: >60 mL/min (ref >60)
Glucose, Bld: 116 mg/dL — ABNORMAL HIGH (ref 70–99)
Potassium: 4.1 mmol/L (ref 3.5–5.1)
Sodium: 138 mmol/L (ref 135–145)
Total Bilirubin: 0.8 mg/dL (ref 0.3–1.2)
Total Protein: 8 g/dL (ref 6.5–8.1)

## 2022-01-25 LAB — CBC WITH DIFFERENTIAL/PLATELET
Abs Immature Granulocytes: 0.03 10*3/uL (ref 0.00–0.07)
Basophils Absolute: 0 10*3/uL (ref 0.0–0.1)
Basophils Relative: 0 %
Eosinophils Absolute: 0.1 10*3/uL (ref 0.0–0.5)
Eosinophils Relative: 2 %
HCT: 45.6 % (ref 39.0–52.0)
Hemoglobin: 14.4 g/dL (ref 13.0–17.0)
Immature Granulocytes: 0 %
Lymphocytes Relative: 14 %
Lymphs Abs: 1.2 10*3/uL (ref 0.7–4.0)
MCH: 29.7 pg (ref 26.0–34.0)
MCHC: 31.6 g/dL (ref 30.0–36.0)
MCV: 94 fL (ref 80.0–100.0)
Monocytes Absolute: 0.8 10*3/uL (ref 0.1–1.0)
Monocytes Relative: 10 %
Neutro Abs: 6.2 10*3/uL (ref 1.7–7.7)
Neutrophils Relative %: 74 %
Platelets: 204 10*3/uL (ref 150–400)
RBC: 4.85 MIL/uL (ref 4.22–5.81)
RDW: 14.8 % (ref 11.5–15.5)
WBC: 8.4 10*3/uL (ref 4.0–10.5)
nRBC: 0 % (ref 0.0–0.2)

## 2022-01-25 LAB — LACTIC ACID, PLASMA: Lactic Acid, Venous: 2.2 mmol/L (ref 0.5–1.9)

## 2022-01-25 LAB — APTT: aPTT: 27 seconds (ref 24–36)

## 2022-01-25 LAB — RESP PANEL BY RT-PCR (FLU A&B, COVID) ARPGX2
Influenza A by PCR: NEGATIVE
Influenza B by PCR: NEGATIVE
SARS Coronavirus 2 by RT PCR: NEGATIVE

## 2022-01-25 LAB — BRAIN NATRIURETIC PEPTIDE: B Natriuretic Peptide: 35.5 pg/mL (ref 0.0–100.0)

## 2022-01-25 LAB — TROPONIN I (HIGH SENSITIVITY): Troponin I (High Sensitivity): 34 ng/L — ABNORMAL HIGH (ref <18)

## 2022-01-25 LAB — PROTIME-INR
INR: 1.4 — ABNORMAL HIGH (ref 0.8–1.2)
Prothrombin Time: 16.6 seconds — ABNORMAL HIGH (ref 11.4–15.2)

## 2022-01-25 MED ORDER — SODIUM CHLORIDE 0.9 % IV SOLN
2.0000 g | Freq: Once | INTRAVENOUS | Status: AC
  Administered 2022-01-25: 2 g via INTRAVENOUS
  Filled 2022-01-25: qty 12.5

## 2022-01-25 MED ORDER — LOSARTAN POTASSIUM 50 MG PO TABS
50.0000 mg | ORAL_TABLET | Freq: Once | ORAL | Status: DC
  Filled 2022-01-25: qty 1

## 2022-01-25 MED ORDER — VANCOMYCIN HCL IN DEXTROSE 1-5 GM/200ML-% IV SOLN
1000.0000 mg | Freq: Once | INTRAVENOUS | Status: AC
Start: 2022-01-25 — End: 2022-01-25
  Administered 2022-01-25: 1000 mg via INTRAVENOUS
  Filled 2022-01-25: qty 200

## 2022-01-25 MED ORDER — FUROSEMIDE 10 MG/ML IJ SOLN
20.0000 mg | Freq: Once | INTRAMUSCULAR | Status: AC
  Administered 2022-01-25: 20 mg via INTRAVENOUS
  Filled 2022-01-25: qty 4

## 2022-01-25 MED ORDER — LACTATED RINGERS IV BOLUS (SEPSIS)
1000.0000 mL | Freq: Once | INTRAVENOUS | Status: AC
  Administered 2022-01-25: 1000 mL via INTRAVENOUS

## 2022-01-25 MED ORDER — AMLODIPINE BESYLATE 5 MG PO TABS
10.0000 mg | ORAL_TABLET | Freq: Once | ORAL | Status: DC
  Filled 2022-01-25: qty 2

## 2022-01-25 MED ORDER — LACTATED RINGERS IV SOLN: INTRAVENOUS | Status: DC

## 2022-01-25 NOTE — Consult Note (Signed)
CODE SEPSIS - PHARMACY COMMUNICATION  **Broad Spectrum Antibiotics should be administered within 1 hour of Sepsis diagnosis**  Time Code Sepsis Called/Page Received: 2130  Antibiotics Ordered: cefepime, vancomycin  Time of 1st antibiotic administration: 2208      Sharen Hones, PharmD, BCPS Clinical Pharmacist   01/25/2022  9:31 PM

## 2022-01-25 NOTE — Consult Note (Signed)
PHARMACY -  BRIEF ANTIBIOTIC NOTE   Pharmacy has received consult(s) for cefepime and vancomycin from an ED provider.  The patient's profile has been reviewed for ht/wt/allergies/indication/available labs.    One time order(s) placed for  Cefepime 2 gram Vancomycin 1000 mg   Further antibiotics/pharmacy consults should be ordered by admitting physician if indicated.                       Thank you, Sharen Hones, PharmD, BCPS Clinical Pharmacist   01/25/2022  9:31 PM

## 2022-01-25 NOTE — Sepsis Progress Note (Signed)
Monitoring for the code sepsis protocol. °

## 2022-01-25 NOTE — ED Provider Notes (Signed)
[facility] Provider Note    Event Date/Time   First MD Initiated Contact with Patient 01/25/22 2126     (approximate)   History   Shortness of Breath   HPI  Ryan [name] is a 51 y.o. male with a history of CHF, diabetes, hypertension, IVC filter due to history of PEs who presents with complaints of fever, shortness of breath.  Patient reports earlier today he "started feeling terrible ".  He reports he is concerned that his pneumonia has returned.  Review of medical records demonstrates the patient was discharged from the hospital on Dec 31, 2021 after similar presentation.  At that time he presented with fever tachycardia and symptoms consistent with sepsis but x-ray and CT scan were negative for pneumonia.     Physical Exam   Triage Vital Signs: ED Triage Vitals  Enc Vitals Group     BP 01/25/22 2135 (!) 192/110     Pulse Rate 01/25/22 2135 (!) 110     Resp 01/25/22 2135 (!) 21     Temp 01/25/22 2135 (!) 101.5 F (38.6 C)     Temp Source 01/25/22 2135 Oral     SpO2 01/25/22 2129 (S) (!) 84 %     Weight 01/25/22 2136 (!) 168 kg (370 lb 6 oz)     Height 01/25/22 2136 1.702 m (5\' 7" )     Head Circumference --      Peak Flow --      Pain Score 01/25/22 2136 5     Pain Loc --      Pain Edu? --      Excl. in GC? --     Most recent vital signs: Vitals:   01/25/22 2135 01/25/22 2215  BP: (!) 192/110 (!) 134/106  Pulse: (!) 110 (!) 107  Resp: (!) 21 17  Temp: (!) 101.5 F (38.6 C)   SpO2: 95% 100%     General: Awake, no distress.  CV:  Good peripheral perfusion.  Tachycardia Resp:  Increased effort with tachypnea Abd:  No distention.  No tenderness to palpation Other:     ED Results / Procedures / Treatments   Labs (all labs ordered are listed, but only abnormal results are displayed) Labs Reviewed  COMPREHENSIVE METABOLIC PANEL - Abnormal; Notable for the following components:      Result Value   Glucose, Bld 116 (*)     Creatinine, Ser 1.30 (*)    Calcium 8.4 (*)    All other components within normal limits  PROTIME-INR - Abnormal; Notable for the following components:   Prothrombin Time 16.6 (*)    INR 1.4 (*)    All other components within normal limits  URINALYSIS, COMPLETE (UACMP) WITH MICROSCOPIC - Abnormal; Notable for the following components:   Color, Urine STRAW (*)    APPearance CLEAR (*)    Glucose, UA >=500 (*)    Hgb urine dipstick SMALL (*)    Protein, ur 30 (*)    All other components within normal limits  RESP PANEL BY RT-PCR (FLU A&B, COVID) ARPGX2  CULTURE, BLOOD (ROUTINE X 2)  CULTURE, BLOOD (ROUTINE X 2)  URINE CULTURE  CBC WITH DIFFERENTIAL/PLATELET  APTT  LACTIC ACID, PLASMA  LACTIC ACID, PLASMA     EKG     RADIOLOGY Chest x-ray viewed interpreted by me, no clear pneumonia    PROCEDURES:  Critical Care performed: yes  CRITICAL CARE Performed by: 01/27/22   Total critical care time:  30 minutes  Critical care time was exclusive of separately billable procedures and treating other patients.  Critical care was necessary to treat or prevent imminent or life-threatening deterioration.  Critical care was time spent personally by me on the following activities: development of treatment plan with patient and/or surrogate as well as nursing, discussions with consultants, evaluation of patient's response to treatment, examination of patient, obtaining history from patient or surrogate, ordering and performing treatments and interventions, ordering and review of laboratory studies, ordering and review of radiographic studies, pulse oximetry and re-evaluation of patient's condition.   Procedures   MEDICATIONS ORDERED IN ED: Medications  lactated ringers infusion ( Intravenous New Bag/Given 01/25/22 2206)  lactated ringers bolus 1,000 mL (1,000 mLs Intravenous New Bag/Given 01/25/22 2202)  vancomycin (VANCOCIN) IVPB 1000 mg/200 mL premix (1,000 mg Intravenous New  Bag/Given 01/25/22 2208)  ceFEPIme (MAXIPIME) 2 g in sodium chloride 0.9 % 100 mL IVPB (2 g Intravenous New Bag/Given 01/25/22 2209)     IMPRESSION / MDM / ASSESSMENT AND PLAN / ED COURSE  I reviewed the triage vital signs and the nursing notes. Patient's presentation is most consistent with acute presentation with potential threat to life or bodily function.  Patient presents febrile, tachycardic and with increased short of breath.  Highly concerning for pneumonia, sepsis, COVID is also a possibility  Sepsis order set activated, broad-spectrum antibiotics began  Lab work notable for normal white blood cell count, urinalysis is unremarkable  Pending CMP and COVID test  Chest x-ray without clear evidence of pneumonia  CMP is unremarkable  Given patient's oxygen requirement/hypoxia, fever and tachycardia concerning for early pneumonia/sepsis, I will consult the hospitalist for admission          FINAL CLINICAL IMPRESSION(S) / ED DIAGNOSES   Final diagnoses:  Sepsis with acute hypoxic respiratory failure, due to unspecified organism, unspecified whether septic shock present [facility])     Rx / DC Orders   ED Discharge Orders     None        Note:  This document was prepared using Dragon voice recognition software and may include unintentional dictation errors.   Jene Every, MD 01/25/22 2234

## 2022-01-25 NOTE — ED Triage Notes (Signed)
Pt arrived via EMS from home with c/c of shortness of breath, N/V, fever, chills, and right lower leg pain. Pt was discharged from hospital approx 2 weeks ago for PNA. This evening about 3 hours ago pt states the shortness of breath occurred along with the other sx. O2 sats on room air are 84%. Pt does have a hx of DVT in the right leg. Denies CP at this time. Pt does not chronically wear O2.

## 2022-01-25 NOTE — ED Notes (Signed)
Notified provider of pt's elevated BP.  

## 2022-01-25 NOTE — ED Notes (Signed)
MD at the bedside  

## 2022-01-25 NOTE — H&P (Signed)
History and Physical    Patient: Ryan [name] [id number] DOB: 11/23/1970 DOA: 01/25/2022 Primary Cardiology: Dr. Maren Reamer. Primary Pulmonary: Maryanna Shape. DOS: the patient was seen and examined on 01/26/2022 PCP: Jon Billings, NP  Patient coming from: Home  Chief Complaint:  Chief Complaint  Patient presents with   Shortness of Breath   HPI: Ryan [name] is a 51 y.o. 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  Patient is acutely dyspneic and mild to moderate distress is able to finish sentences but is acutely short of breath and hypoxic on oxygen.  Patient returns to [place] today via EMS with complaints of shortness of breath, nausea vomiting, fevers chills and room right lower leg pain.  Oxygen saturation on room air on arrival is 84%.  Patient presents with same presentation as stated earlier this month with sepsis and a negative chest x-ray.  CT angio was done on last admission which showed chronic pulmonary embolism, nonobstructive, scarring present.  Patient does not report any headaches blurred vision speech or gait issues, chest pain,  abdominal pain , diarrhea.  Initial vitals patient is hypertensive tachycardic at 110 with O2 sats of 84% with fever of 101.5.  Patient meets severe sepsis criteria although I do not suspect an infectious origin.  Patient in the emergency room was given LR 1 L bolus with LR at 150, in addition to cefepime and vancomycin.   Review of Systems  Constitutional:  Positive for malaise/fatigue.  Respiratory:  Positive for cough and shortness of breath.   All other systems reviewed and are negative.  Past Medical History:  Diagnosis Date   CHF (congestive heart failure) ([place])    Diabetes mellitus without  complication ([place])    per pt-pre diabetic-Dr Rosario Jacks stated said pt   DVT (deep venous thrombosis) ([place])    Hypertension    Peripheral vascular disease ([place])    Presence of IVC filter    Past Surgical History:  Procedure Laterality Date   CARDIAC ELECTROPHYSIOLOGY STUDY AND ABLATION  10/2020   PERIPHERAL VASCULAR CATHETERIZATION N/A 01/10/2015   Procedure: Dialysis/Perma Catheter Insertion;  Surgeon: Katha Cabal, MD;  Location: [facility];  Service: Cardiovascular;  Laterality: N/A;   PERIPHERAL VASCULAR CATHETERIZATION N/A 02/24/2015   Procedure: Dialysis/Perma Catheter Removal;  Surgeon: Algernon Huxley, MD;  Location: [facility];  Service: Cardiovascular;  Laterality: N/A;   Social History:  reports that he has never smoked. He has never used smokeless tobacco. He reports that he does not drink alcohol and does not use drugs.  Allergies  Allergen Reactions   Iodinated Contrast Media Other (See Comments)    Kidney failure requiring dialysis in 2013. He has had cta for PE since 2013.     Metrizamide Other (See Comments)    Kidney failure requiring dialysis Kidney failure requiring dialysis    Clindamycin/Lincomycin Rash   Lincomycin Rash   Sulfa Antibiotics Rash    Family History  Problem Relation Age of Onset   Hypertension Mother    Cancer Mother    Breast cancer Mother    Prostate cancer Father    Hypertension Father    Diabetes Father    Pulmonary embolism Paternal Grandfather    Pulmonary embolism Paternal Uncle     Prior to Admission medications  Medication Sig Start Date End Date Taking? Authorizing Provider  amLODipine (NORVASC) 10 MG tablet Take 10 mg by mouth daily. 12/04/21   [provider]  empagliflozin (JARDIANCE) 25 MG TABS tablet Take 1 tablet (25 mg total) by mouth daily before breakfast. 12/09/21   Wynetta Emery, Megan P, DO  losartan (COZAAR) 50 MG tablet Take 50 mg by mouth daily. 03/11/20   [provider]  magnesium  oxide (MAG-OX) 400 MG tablet Take 1 tablet by mouth in the morning and at bedtime. 10/23/21 10/23/22  [provider]  metoprolol succinate (TOPROL-XL) 25 MG 24 hr tablet Take 1 tablet by mouth daily.    [provider]  spironolactone (ALDACTONE) 50 MG tablet Take 0.5 tablets (25 mg total) by mouth daily. 01/04/22   Annita Brod, MD  XARELTO 20 MG TABS tablet Take 20 mg by mouth daily. 12/07/21   [provider]    Physical Exam: Vitals:   01/25/22 2135 01/25/22 2136 01/25/22 2215 01/25/22 2330  BP: (!) 192/110  (!) 134/106 (!) 180/113  Pulse: (!) 110  (!) 107 (!) 120  Resp: (!) 21  17 (!) 28  Temp: (!) 101.5 F (38.6 C)     TempSrc: Oral     SpO2: 95%  100% 95%  Weight:  (!) 168 kg    Height:  5\' 7"  (1.702 m)    Physical Exam Vitals and nursing note reviewed.  Constitutional:      General: He is in acute distress.     Appearance: Normal appearance. He is obese. He is ill-appearing. He is not toxic-appearing or diaphoretic.     Interventions: Nasal cannula in place.  HENT:     Head: Normocephalic and atraumatic.     Right Ear: Hearing and external ear normal.     Left Ear: Hearing and external ear normal.     Nose: Nose normal. No nasal deformity.     Mouth/Throat:     Lips: Pink.     Mouth: Mucous membranes are moist.     Tongue: No lesions.  Eyes:     Extraocular Movements: Extraocular movements intact.     Pupils: Pupils are equal, round, and reactive to light.  Cardiovascular:     Rate and Rhythm: Normal rate and regular rhythm.     Pulses: Normal pulses.     Heart sounds: Normal heart sounds.  Pulmonary:     Effort: Pulmonary effort is normal.     Breath sounds: Examination of the right-upper field reveals wheezing. Examination of the left-upper field reveals wheezing. Examination of the right-middle field reveals rales. Examination of the left-middle field reveals rales. Examination of the right-lower field reveals rales. Examination of the  left-lower field reveals rales. Wheezing and rales present.  Abdominal:     General: Bowel sounds are normal. There is distension.     Palpations: Abdomen is soft. There is no mass.     Tenderness: There is no abdominal tenderness. There is no guarding.     Hernia: No hernia is present.  Musculoskeletal:     Right lower leg: Edema present.     Left lower leg: Edema present.  Skin:    General: Skin is warm.  Neurological:     General: No focal deficit present.     Mental Status: He is alert and oriented to person, place, and time.     Cranial Nerves: Cranial nerves 2-12 are intact.     Motor: Motor function is intact.  Psychiatric:  Attention and Perception: Attention normal.        Mood and Affect: Mood normal.        Speech: Speech normal.        Behavior: Behavior normal. Behavior is cooperative.        Cognition and Memory: Cognition normal.     Data Reviewed: Results for orders placed or performed during the hospital encounter of 01/25/22 (from the past 24 hour(s))  Lactic acid, plasma     Status: Abnormal   Collection Time: 01/25/22  9:45 PM  Result Value Ref Range   Lactic Acid, Venous 2.2 (HH) 0.5 - 1.9 mmol/L  Comprehensive metabolic panel     Status: Abnormal   Collection Time: 01/25/22  9:45 PM  Result Value Ref Range   Sodium 138 135 - 145 mmol/L   Potassium 4.1 3.5 - 5.1 mmol/L   Chloride 104 98 - 111 mmol/L   CO2 24 22 - 32 mmol/L   Glucose, Bld 116 (H) 70 - 99 mg/dL   BUN 12 6 - 20 mg/dL   Creatinine, Ser 1.30 (H) 0.61 - 1.24 mg/dL   Calcium 8.4 (L) 8.9 - 10.3 mg/dL   Total Protein 8.0 6.5 - 8.1 g/dL   Albumin 3.8 3.5 - 5.0 g/dL   AST 22 15 - 41 U/L   ALT 25 0 - 44 U/L   Alkaline Phosphatase 58 38 - 126 U/L   Total Bilirubin 0.8 0.3 - 1.2 mg/dL   GFR, Estimated >60 >60 mL/min   Anion gap 10 5 - 15  CBC with Differential     Status: None   Collection Time: 01/25/22  9:45 PM  Result Value Ref Range   WBC 8.4 4.0 - 10.5 K/uL   RBC 4.85 4.22 -  5.81 MIL/uL   Hemoglobin 14.4 13.0 - 17.0 g/dL   HCT 45.6 39.0 - 52.0 %   MCV 94.0 80.0 - 100.0 fL   MCH 29.7 26.0 - 34.0 pg   MCHC 31.6 30.0 - 36.0 g/dL   RDW 14.8 11.5 - 15.5 %   Platelets 204 150 - 400 K/uL   nRBC 0.0 0.0 - 0.2 %   Neutrophils Relative % 74 %   Neutro Abs 6.2 1.7 - 7.7 K/uL   Lymphocytes Relative 14 %   Lymphs Abs 1.2 0.7 - 4.0 K/uL   Monocytes Relative 10 %   Monocytes Absolute 0.8 0.1 - 1.0 K/uL   Eosinophils Relative 2 %   Eosinophils Absolute 0.1 0.0 - 0.5 K/uL   Basophils Relative 0 %   Basophils Absolute 0.0 0.0 - 0.1 K/uL   Immature Granulocytes 0 %   Abs Immature Granulocytes 0.03 0.00 - 0.07 K/uL  Protime-INR     Status: Abnormal   Collection Time: 01/25/22  9:45 PM  Result Value Ref Range   Prothrombin Time 16.6 (H) 11.4 - 15.2 seconds   INR 1.4 (H) 0.8 - 1.2  APTT     Status: None   Collection Time: 01/25/22  9:45 PM  Result Value Ref Range   aPTT 27 24 - 36 seconds  Urinalysis, Complete w Microscopic Anterior Nasal Swab     Status: Abnormal   Collection Time: 01/25/22 10:00 PM  Result Value Ref Range   Color, Urine STRAW (A) YELLOW   APPearance CLEAR (A) CLEAR   Specific Gravity, Urine 1.015 1.005 - 1.030   pH 6.0 5.0 - 8.0   Glucose, UA >=500 (A) NEGATIVE mg/dL   Hgb urine dipstick SMALL (  A) NEGATIVE   Bilirubin Urine NEGATIVE NEGATIVE   Ketones, ur NEGATIVE NEGATIVE mg/dL   Protein, ur 30 (A) NEGATIVE mg/dL   Nitrite NEGATIVE NEGATIVE   Leukocytes,Ua NEGATIVE NEGATIVE   RBC / HPF 0-5 0 - 5 RBC/hpf   WBC, UA 0-5 0 - 5 WBC/hpf   Bacteria, UA NONE SEEN NONE SEEN   Squamous Epithelial / LPF 0-5 0 - 5     Assessment and Plan: * SOB (shortness of breath) Patient presenting with shortness of breath and severe sepsis presentation. Supplemental oxygen, attribute to underlying pulmonary embolism and suspected pulmonary hypertension. We will obtain a 2D echocardiogram and cardiology consult. We will also obtain a pulmonary consult. Pt  needs eval for pulmonary htn and treatment for OSA.   Acute respiratory failure with hypoxia ([place]) Again patient presenting with hypoxic respiratory failure attributed to chronic pulmonary embolism and suspected acute pulmonary embolism with elevated pulmonary artery pressures and pulmonary hypertension in addition to sleep apnea and diastolic dysfunction. Supplemental oxygen as deemed appropriate to maintain O2 sats at 90% and above. SpO2: 95 % O2 Flow Rate (L/min): 4 L/min    Pulmonary emboli (HCC) Pt has h/o VTE since age of 37 and we will transition to heparin gtt while in hospital. Reimage prior to discharge to see if chronic blood clots have resolved.  Consider long term coumadin or get hematology consult for long term plan not sure if he is xarelto failure to at lest a partial extent with pe on cta.    Severe sepsis ([place]) Pt has received cefepime and vancomycin and IVF. We will limit IVF as he has h/o PE and suspect underlying CHF.  Will cont ivf abx per pharmacy protocol.  D/c abx as deemed appropriate.     Chronic diastolic CHF (congestive heart failure) (HCC) continue metoprolol.  PRN hydralazine.   DVT (deep venous thrombosis) (HCC) Currently pt on heparin gtt. Stop xarelto.  HTN (hypertension) Currently [pt on metoprolol. PRN hydralazine.  HOLD AMLODIPINE. HOLD LOSARTAN AND ALDACTONE.   Diabetes mellitus without complication ([place]) Hold jardiance. Glycemic protocol.    CKD (chronic kidney disease), stage IIIa Lab Results  Component Value Date   CREATININE 1.30 (H) 01/25/2022   CREATININE 1.33 (H) 01/04/2022   CREATININE 1.41 (H) 01/02/2022  stable for now we will hold losartan and aldactone.  If creatinine improves then we will restart at half the dose.      Advance Care Planning:    Code Status: Full Code   Consults:  Pulmonary: Dr.Aleskerov. Cardiology: Dr.Paraschos.  Family Communication:  Keita, Lavis (Spouse)  [phone] (Work  Phone)   Severity of Illness: The appropriate patient status for this patient is INPATIENT. Inpatient status is judged to be reasonable and necessary in order to provide the required intensity of service to ensure the patient's safety. The patient's presenting symptoms, physical exam findings, and initial radiographic and laboratory data in the context of their chronic comorbidities is felt to place them at high risk for further clinical deterioration. Furthermore, it is not anticipated that the patient will be medically stable for discharge from the hospital within 2 midnights of admission.   * I certify that at the point of admission it is my clinical judgment that the patient will require inpatient hospital care spanning beyond 2 midnights from the point of admission due to high intensity of service, high risk for further deterioration and high frequency of surveillance required.*  Author: Para Skeans, MD 01/26/2022 12:47 AM  For on  call review [email or web address].

## 2022-01-25 NOTE — ED Notes (Signed)
Port CXR performed 

## 2022-01-26 ENCOUNTER — Ambulatory Visit: Payer: No Typology Code available for payment source | Admitting: Nurse Practitioner

## 2022-01-26 DIAGNOSIS — E1122 Type 2 diabetes mellitus with diabetic chronic kidney disease: Secondary | ICD-10-CM | POA: Diagnosis present

## 2022-01-26 DIAGNOSIS — Z833 Family history of diabetes mellitus: Secondary | ICD-10-CM | POA: Diagnosis not present

## 2022-01-26 DIAGNOSIS — Z7901 Long term (current) use of anticoagulants: Secondary | ICD-10-CM | POA: Diagnosis not present

## 2022-01-26 DIAGNOSIS — E7800 Pure hypercholesterolemia, unspecified: Secondary | ICD-10-CM

## 2022-01-26 DIAGNOSIS — Z86718 Personal history of other venous thrombosis and embolism: Secondary | ICD-10-CM | POA: Diagnosis not present

## 2022-01-26 DIAGNOSIS — R6520 Severe sepsis without septic shock: Secondary | ICD-10-CM | POA: Diagnosis present

## 2022-01-26 DIAGNOSIS — N1831 Chronic kidney disease, stage 3a: Secondary | ICD-10-CM | POA: Diagnosis present

## 2022-01-26 DIAGNOSIS — Z91041 Radiographic dye allergy status: Secondary | ICD-10-CM | POA: Diagnosis not present

## 2022-01-26 DIAGNOSIS — Z20822 Contact with and (suspected) exposure to covid-19: Secondary | ICD-10-CM | POA: Diagnosis present

## 2022-01-26 DIAGNOSIS — A419 Sepsis, unspecified organism: Secondary | ICD-10-CM | POA: Diagnosis present

## 2022-01-26 DIAGNOSIS — Z86711 Personal history of pulmonary embolism: Secondary | ICD-10-CM | POA: Diagnosis not present

## 2022-01-26 DIAGNOSIS — I8290 Acute embolism and thrombosis of unspecified vein: Secondary | ICD-10-CM | POA: Diagnosis present

## 2022-01-26 DIAGNOSIS — I739 Peripheral vascular disease, unspecified: Secondary | ICD-10-CM | POA: Diagnosis present

## 2022-01-26 DIAGNOSIS — I82403 Acute embolism and thrombosis of unspecified deep veins of lower extremity, bilateral: Secondary | ICD-10-CM

## 2022-01-26 DIAGNOSIS — Z7984 Long term (current) use of oral hypoglycemic drugs: Secondary | ICD-10-CM | POA: Diagnosis not present

## 2022-01-26 DIAGNOSIS — I4891 Unspecified atrial fibrillation: Secondary | ICD-10-CM | POA: Diagnosis present

## 2022-01-26 DIAGNOSIS — I5032 Chronic diastolic (congestive) heart failure: Secondary | ICD-10-CM

## 2022-01-26 DIAGNOSIS — Z888 Allergy status to other drugs, medicaments and biological substances status: Secondary | ICD-10-CM | POA: Diagnosis not present

## 2022-01-26 DIAGNOSIS — Z882 Allergy status to sulfonamides status: Secondary | ICD-10-CM | POA: Diagnosis not present

## 2022-01-26 DIAGNOSIS — Z6841 Body Mass Index (BMI) 40.0 and over, adult: Secondary | ICD-10-CM | POA: Diagnosis not present

## 2022-01-26 DIAGNOSIS — N183 Chronic kidney disease, stage 3 unspecified: Secondary | ICD-10-CM

## 2022-01-26 DIAGNOSIS — Z8249 Family history of ischemic heart disease and other diseases of the circulatory system: Secondary | ICD-10-CM | POA: Diagnosis not present

## 2022-01-26 DIAGNOSIS — Z881 Allergy status to other antibiotic agents status: Secondary | ICD-10-CM | POA: Diagnosis not present

## 2022-01-26 DIAGNOSIS — Z95828 Presence of other vascular implants and grafts: Secondary | ICD-10-CM | POA: Diagnosis not present

## 2022-01-26 DIAGNOSIS — E119 Type 2 diabetes mellitus without complications: Secondary | ICD-10-CM

## 2022-01-26 DIAGNOSIS — I130 Hypertensive heart and chronic kidney disease with heart failure and stage 1 through stage 4 chronic kidney disease, or unspecified chronic kidney disease: Secondary | ICD-10-CM | POA: Diagnosis present

## 2022-01-26 DIAGNOSIS — I10 Essential (primary) hypertension: Secondary | ICD-10-CM

## 2022-01-26 DIAGNOSIS — R0602 Shortness of breath: Secondary | ICD-10-CM | POA: Diagnosis present

## 2022-01-26 DIAGNOSIS — J9601 Acute respiratory failure with hypoxia: Secondary | ICD-10-CM

## 2022-01-26 DIAGNOSIS — Z79899 Other long term (current) drug therapy: Secondary | ICD-10-CM | POA: Diagnosis not present

## 2022-01-26 DIAGNOSIS — I2782 Chronic pulmonary embolism: Secondary | ICD-10-CM | POA: Diagnosis present

## 2022-01-26 LAB — PROTIME-INR
INR: 1.5 — ABNORMAL HIGH (ref 0.8–1.2)
Prothrombin Time: 17.8 seconds — ABNORMAL HIGH (ref 11.4–15.2)

## 2022-01-26 LAB — MAGNESIUM: Magnesium: 1.7 mg/dL (ref 1.7–2.4)

## 2022-01-26 LAB — BASIC METABOLIC PANEL
Anion gap: 10 (ref 5–15)
BUN: 12 mg/dL (ref 6–20)
CO2: 23 mmol/L (ref 22–32)
Calcium: 8.6 mg/dL — ABNORMAL LOW (ref 8.9–10.3)
Chloride: 103 mmol/L (ref 98–111)
Creatinine, Ser: 1.6 mg/dL — ABNORMAL HIGH (ref 0.61–1.24)
GFR, Estimated: 52 mL/min — ABNORMAL LOW (ref >60)
Glucose, Bld: 118 mg/dL — ABNORMAL HIGH (ref 70–99)
Potassium: 4.2 mmol/L (ref 3.5–5.1)
Sodium: 136 mmol/L (ref 135–145)

## 2022-01-26 LAB — RESPIRATORY PANEL BY PCR

## 2022-01-26 LAB — HEPARIN LEVEL (UNFRACTIONATED)
Heparin Unfractionated: >1.1 IU/mL — ABNORMAL HIGH (ref 0.30–0.70)
Heparin Unfractionated: >1.1 IU/mL — ABNORMAL HIGH (ref 0.30–0.70)

## 2022-01-26 LAB — APTT
aPTT: 83 seconds — ABNORMAL HIGH (ref 24–36)
aPTT: 45 seconds — ABNORMAL HIGH (ref 24–36)
aPTT: 66 seconds — ABNORMAL HIGH (ref 24–36)
aPTT: 58 seconds — ABNORMAL HIGH (ref 24–36)

## 2022-01-26 LAB — LACTIC ACID, PLASMA
Lactic Acid, Venous: 2.1 mmol/L (ref 0.5–1.9)
Lactic Acid, Venous: 1.4 mmol/L (ref 0.5–1.9)

## 2022-01-26 LAB — GLUCOSE, CAPILLARY: Glucose-Capillary: 130 mg/dL — ABNORMAL HIGH (ref 70–99)

## 2022-01-26 LAB — CBG MONITORING, ED: Glucose-Capillary: 99 mg/dL (ref 70–99)

## 2022-01-26 LAB — TROPONIN I (HIGH SENSITIVITY): Troponin I (High Sensitivity): 41 ng/L — ABNORMAL HIGH (ref <18)

## 2022-01-26 LAB — PROCALCITONIN: Procalcitonin: 0.1 ng/mL

## 2022-01-26 MED ORDER — MAGNESIUM SULFATE 2 GM/50ML IV SOLN
2.0000 g | Freq: Once | INTRAVENOUS | Status: AC
  Administered 2022-01-26: 2 g via INTRAVENOUS
  Filled 2022-01-26: qty 50

## 2022-01-26 MED ORDER — SODIUM CHLORIDE 0.9 % IV SOLN
2.0000 g | Freq: Three times a day (TID) | INTRAVENOUS | Status: DC
  Administered 2022-01-26 – 2022-01-27 (×4): 2 g via INTRAVENOUS
  Filled 2022-01-26 (×6): qty 12.5

## 2022-01-26 MED ORDER — VANCOMYCIN HCL 2000 MG/400ML IV SOLN
2000.0000 mg | INTRAVENOUS | Status: DC
  Administered 2022-01-26: 2000 mg via INTRAVENOUS
  Filled 2022-01-26 (×3): qty 400

## 2022-01-26 MED ORDER — HEPARIN (PORCINE) 25000 UT/250ML-% IV SOLN
2500.0000 [IU]/h | INTRAVENOUS | Status: DC
  Administered 2022-01-26: 1750 [IU]/h via INTRAVENOUS
  Administered 2022-01-26: 2100 [IU]/h via INTRAVENOUS
  Administered 2022-01-27: 2500 [IU]/h via INTRAVENOUS
  Administered 2022-01-27: 2300 [IU]/h via INTRAVENOUS
  Administered 2022-01-28: 2500 [IU]/h via INTRAVENOUS
  Filled 2022-01-26 (×6): qty 250

## 2022-01-26 MED ORDER — METOPROLOL SUCCINATE ER 25 MG PO TB24
25.0000 mg | ORAL_TABLET | Freq: Every day | ORAL | Status: DC
  Administered 2022-01-26 – 2022-01-28 (×3): 25 mg via ORAL
  Filled 2022-01-26 (×3): qty 1

## 2022-01-26 MED ORDER — INSULIN ASPART 100 UNIT/ML IJ SOLN
0.0000 [IU] | Freq: Three times a day (TID) | INTRAMUSCULAR | Status: DC
  Administered 2022-01-26 (×2): 0 [IU] via SUBCUTANEOUS
  Administered 2022-01-26 – 2022-01-27 (×3): 1 [IU] via SUBCUTANEOUS
  Filled 2022-01-26 (×3): qty 1

## 2022-01-26 MED ORDER — AMLODIPINE BESYLATE 10 MG PO TABS
10.0000 mg | ORAL_TABLET | Freq: Every day | ORAL | Status: DC
  Administered 2022-01-26 – 2022-01-27 (×2): 10 mg via ORAL
  Filled 2022-01-26 (×2): qty 1

## 2022-01-26 MED ORDER — SODIUM CHLORIDE 0.9 % IV SOLN: INTRAVENOUS | Status: DC

## 2022-01-26 MED ORDER — VANCOMYCIN HCL 1250 MG/250ML IV SOLN
1250.0000 mg | Freq: Two times a day (BID) | INTRAVENOUS | Status: DC
  Filled 2022-01-26: qty 250

## 2022-01-26 MED ORDER — HEPARIN BOLUS VIA INFUSION
3300.0000 [IU] | Freq: Once | INTRAVENOUS | Status: AC
  Administered 2022-01-26: 3300 [IU] via INTRAVENOUS
  Filled 2022-01-26: qty 3300

## 2022-01-26 MED ORDER — POLYETHYLENE GLYCOL 3350 17 G PO PACK: 17.0000 g | PACK | Freq: Every day | ORAL | Status: DC | PRN

## 2022-01-26 MED ORDER — HEPARIN BOLUS VIA INFUSION
1600.0000 [IU] | Freq: Once | INTRAVENOUS | Status: AC
  Administered 2022-01-26: 1600 [IU] via INTRAVENOUS
  Filled 2022-01-26: qty 1600

## 2022-01-26 MED ORDER — VANCOMYCIN HCL 1500 MG/300ML IV SOLN
1500.0000 mg | Freq: Once | INTRAVENOUS | Status: DC
Start: 2022-01-26 — End: 2022-01-26
  Filled 2022-01-26: qty 300

## 2022-01-26 MED ORDER — PANTOPRAZOLE SODIUM 40 MG IV SOLR
40.0000 mg | Freq: Every day | INTRAVENOUS | Status: DC
Start: 2022-01-26 — End: 2022-01-28
  Administered 2022-01-26 – 2022-01-27 (×3): 40 mg via INTRAVENOUS
  Filled 2022-01-26 (×3): qty 10

## 2022-01-26 MED ORDER — VANCOMYCIN HCL 1250 MG/250ML IV SOLN
1250.0000 mg | Freq: Once | INTRAVENOUS | Status: AC
  Administered 2022-01-26: 1250 mg via INTRAVENOUS
  Filled 2022-01-26: qty 250

## 2022-01-26 MED ORDER — DOCUSATE SODIUM 100 MG PO CAPS: 100.0000 mg | ORAL_CAPSULE | Freq: Two times a day (BID) | ORAL | Status: DC | PRN

## 2022-01-26 MED ORDER — HEPARIN BOLUS VIA INFUSION
7000.0000 [IU] | Freq: Once | INTRAVENOUS | Status: AC
  Administered 2022-01-26: 7000 [IU] via INTRAVENOUS
  Filled 2022-01-26: qty 7000

## 2022-01-26 NOTE — Progress Notes (Signed)
ANTICOAGULATION CONSULT NOTE  Pharmacy Consult for heparin infusion Indication: pulmonary embolus  Allergies  Allergen Reactions   Iodinated Contrast Media Other (See Comments)    Kidney failure requiring dialysis in 2013. He has had cta for PE since 2013.     Metrizamide Other (See Comments)    Kidney failure requiring dialysis Kidney failure requiring dialysis    Clindamycin/Lincomycin Rash   Lincomycin Rash   Sulfa Antibiotics Rash    Patient Measurements: Height: 5\' 7"  (170.2 cm) Weight: (!) 168 kg (370 lb 6 oz) IBW/kg (Calculated) : 66.1 Heparin Dosing Weight: 108.2 kg  Vital Signs: Temp: 99.1 F (37.3 C) (05/30 0644) Temp Source: Oral (05/30 0644) BP: 153/82 (05/30 1430) Pulse Rate: 89 (05/30 1430)  Labs: Recent Labs    01/25/22 2145 01/26/22 0141 01/26/22 0620 01/26/22 1400  HGB 14.4  --   --   --   HCT 45.6  --   --   --   PLT 204  --   --   --   APTT 27 83* 45* 66*  LABPROT 16.6* 17.8*  --   --   INR 1.4* 1.5*  --   --   HEPARINUNFRC  --  >1.10* >1.10*  --   CREATININE 1.30*  --  1.60*  --   TROPONINIHS 34* 41*  --   --      Estimated Creatinine Clearance: 81.7 mL/min (A) (by C-G formula based on SCr of 1.6 mg/dL (H)).   Medical History: Past Medical History:  Diagnosis Date   CHF (congestive heart failure) (HCC)    Diabetes mellitus without complication (HCC)    per pt-pre diabetic-Dr 01/28/22 stated said pt   DVT (deep venous thrombosis) (HCC)    Hypertension    Peripheral vascular disease (HCC)    Presence of IVC filter     Medications:  PTA: Xarelto 20 mg q24h, last dose: 5/28 (PM)  Assessment: Pt is 51 yo male presenting to ED c/o SOB, N/V, fever, chills, & R lower leg pain with h/o chronic PE, believed to be failing on Xarelto per admitting MD.  Goal of Therapy:  Heparin level 0.3-0.7 units/ml aPTT 66-108 seconds Monitor platelets by anticoagulation protocol: Yes   Plan:  05/30 @ 1400: aPTT 66 sec, therapeutic x1 Continue  heparin infusion at 2100 units/hr Will follow aPTT until correlation with HL is confirmed Will check aPTT in 6 hr after start of infusion HL & CBC daily while on heparin.  6/30, PharmD Clinical Pharmacist 01/26/2022 2:37 PM

## 2022-01-26 NOTE — Assessment & Plan Note (Addendum)
Recent Labs    06/06/21 0429 07/29/21 1411 10/29/21 0914 12/31/21 1828 01/01/22 0828 01/02/22 0500 01/04/22 0521 01/25/22 2145 01/26/22 0620 01/27/22 0515 01/28/22 0221  BUN 18 13 18 16   --  15 19 12 12 13 17   CREATININE 1.38* 1.43* 1.57* 1.42* 1.44* 1.41* 1.33* 1.30* 1.60* 1.39* 1.36*  Creatinine seems to be at baseline now. Patient to hold losartan for 5 days.  Discontinued Aldactone.  Recheck renal function in 1 to 2 weeks

## 2022-01-26 NOTE — Assessment & Plan Note (Addendum)
Desaturated to 84% on RA and started on supplemental oxygen.  Multifactorial including pulmonary hypertension from known PE, pneumonia,  possible OSA/OHS and diastolic CHF.  CXR without acute finding.  Procalcitonin elevated to 0.95.  He reports good compliance with Xarelto.  Has not been evaluated for sleep apnea.  Overall, reports improvement with broad-spectrum antibiotics.  Recent TTE on 5/6 with LVEF of 60 to 65%, G2-DD and RVSP of 30 mmHg.  Difficult to assess fluid status due to body habitus. -De-escalate antibiotics to IV ceftriaxone and azithromycin -Continue IV heparin for chronic PE/VTE and A-fib. -Incentive spirometry, OOB, ambulatory saturation, PT/OT -Consider diuretics once renal function improves

## 2022-01-26 NOTE — Assessment & Plan Note (Signed)
Hold jardiance. Glycemic protocol.

## 2022-01-26 NOTE — Progress Notes (Signed)
Pharmacy Antibiotic Note  Ryan [name] is a 51 y.o. male admitted on 01/25/2022 with sepsis.  Pharmacy has been consulted for Cefepime and Vancomycin dosing.  Plan: Cefepime 2 gm q8h per indication & renal fxn.  Pt ordered total initial dose of 2250 mg. Vancomycin 1250 mg IV Q 12 hrs.  Goal AUC 400-550. Expected AUC: 531.6 SCr used: 1.3 Vd used: 0.5, BMI 58.01  Pharmacy will continue to follow and will adjust abx dosing whenever warranted.   Height: 5\' 7"  (170.2 cm) Weight: (!) 168 kg (370 lb 6 oz) IBW/kg (Calculated) : 66.1  Temp (24hrs), Avg:100.7 F (38.2 C), Min:99.8 F (37.7 C), Max:101.5 F (38.6 C)  Recent Labs  Lab 01/25/22 2145  WBC 8.4  CREATININE 1.30*  LATICACIDVEN 2.2*    Estimated Creatinine Clearance: 100.5 mL/min (A) (by C-G formula based on SCr of 1.3 mg/dL (H)).    Allergies  Allergen Reactions   Iodinated Contrast Media Other (See Comments)    Kidney failure requiring dialysis in 2013. He has had cta for PE since 2013.     Metrizamide Other (See Comments)    Kidney failure requiring dialysis Kidney failure requiring dialysis    Clindamycin/Lincomycin Rash   Lincomycin Rash   Sulfa Antibiotics Rash    Antimicrobials this admission: 5/29 Cefepime >>  5/29 Vancomycin >>   Microbiology results: 5/29 BCx: Pending 5/29 UCx: Pending   Thank you for allowing pharmacy to be a part of this patient's care.  6/29, PharmD, MBA 01/26/2022 1:21 AM

## 2022-01-26 NOTE — Plan of Care (Signed)

## 2022-01-26 NOTE — Consult Note (Signed)
NAME:  Ryan [name], MRN:  [id number], DOB:  January 30, 1971, LOS: 0 ADMISSION DATE:  01/25/2022, CONSULTATION DATE:  01/25/2022 REFERRING MD:  Dr. 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  On presentation he was noted to be dyspneic with mild to moderate respiratory distress and hypoxic (O2 sats of 84%).  He also endorsed, nausea, vomiting, fever, chills, right lower extremity leg pain.  Of note patient was recently admitted to [facility] from 5//23 through 01/04/2022 due to complaints of shortness of breath, fever, chills.  He was admitted for severe sepsis and acute hypoxic respiratory failure due to suspected pneumonia, acute on chronic HFpEF, and bilateral nonocclusive DVT to bilateral lower extremities.  ED Course: Initial Vital Signs: Temperature 101.5 F orally, pulse 110, respiratory rate 21, blood pressure 192/110, SPO2 84% Significant Labs: Glucose 116, BUN 12, creatinine 1.3, BNP 35.5, high-sensitivity troponin 34, lactic acid 2.2, procalcitonin 0.10, WBC 8.4 COVID-19 and influenza PCR are negative Urinalysis negative for UTI Imaging Chest X-ray>>FINDINGS: The heart size and mediastinal contours are within normal limits. Both lungs are clear. The visualized skeletal structures are unremarkable. Medications Administered: 1 L LR bolus, cefepime, vancomycin  Hospitalist were asked to admit the patient for further work-up and treatment.  PCCM is asked to see the patient in pulmonary consultation.   Pertinent  Medical History    Past Medical History:  Diagnosis Date   CHF (congestive heart failure) ([place])    Diabetes mellitus without complication ([place])    per pt-pre diabetic-Dr Rosario Jacks stated said pt   DVT (deep venous thrombosis) ([place])    Hypertension    Peripheral vascular disease ([place])    Presence of IVC filter      Micro Data:  5/29: SARS-CoV-2 and influenza PCR>> negative 5/29: Blood culture x2>> 5/29: Urine>>  Antimicrobials:  Cefepime 5/29>> Vancomycin 5/29>>  Significant Hospital Events: Including procedures, antibiotic start and stop dates in addition to other pertinent events   5/29: Admitted by hospitalist.  Pulmonary consulted.  Interim History / Subjective:  -Patient admitted by hospitalist -PCCM asked to see in pulmonary consultation due to concern for severe pulmonary hypertension -Patient on 4 L nasal cannula, no acute distress  Objective   Blood pressure (!) 159/101, pulse (!) 106, temperature 99.8 F (37.7 C), temperature source Oral, resp. rate (!) 24, height 5\' 7"  (1.702 m), weight (!) 168 kg, SpO2 94 %.        Intake/Output Summary (Last 24 hours) at 01/26/2022 0117 Last data filed at 01/26/2022 0047 Gross per 24 hour  Intake 1684.62 ml  Output --  Net 1684.62 ml   Filed Weights   01/25/22 2136  Weight: (!) 168 kg    Examination: General: Acute on chronically ill-appearing morbidly obese male, lying in bed, on nasal cannula, mild respiratory distress HENT: Atraumatic, no swelling, neck supple, difficult to assess JVD due to body habitus Lungs: Coarse breath sounds bilaterally, even, mild tachypnea Cardiovascular: Tachycardic, regular rhythm, S1-S2, no murmurs, rubs, gallops Abdomen: Obese, soft, nontender, no guarding or rebound  tenderness, bowel sounds positive x4 Extremities: Bilateral lower extremity edema Neuro: Awake and alert, oriented to person, place, time, moves extremities to commands, no focal deficits, speech clear GU: Deferred  Resolved Hospital  Problem list     Assessment & Plan:   Acute hypoxic respiratory failure in the setting of known pulmonary embolism, suspected pulmonary hypertension, & questionable pneumonia -Supplemental O2 as needed to maintain O2 sats >90% -BiPAP if needed -Follow intermittent Chest X-ray & ABG as needed -Bronchodilators Prn -Diuresis as BP and renal function permits -Pulmonary toilet as able  Known pulmonary emboli and bilateral DVTs status post IVC filter placement Chronic HFpEF without acute exacerbation Hypertension -Continuous cardiac monitoring -Maintain MAP >65 -Vasopressors as needed to maintain MAP goal -Trend lactic acid until normalized -Trend HS Troponin until peaked -Echocardiogram pending -Transition to heparin drip while in hospital (on Xarelto at home) -Consider hematology consult -Continue metoprolol -As needed hydralazine  Meet SIRS criteria on admission No obvious source of infection at this time -Monitor fever curve -Trend WBC's, procalcitonin negative x1 -Follow cultures as above -Initial Chest x-ray without obvious pneumonia, urinalysis negative for UTI -Continue empiri cefepime and vancomycin for now pending cultures and sensitivities  Chronic kidney disease stage III -Monitor I&O's / urinary output -Follow BMP -Ensure adequate renal perfusion -Avoid nephrotoxic agents as able -Replace electrolytes as indicated      Best Practice (right click and "Reselect all SmartList Selections" daily)   Diet/type: Regular consistency (see orders) DVT prophylaxis: systemic heparin GI prophylaxis: PPI Lines: N/A Foley:  N/A Code Status:  full code Last date of multidisciplinary goals of care discussion [N/A]  Labs   CBC: Recent Labs  Lab 01/25/22 2145  WBC 8.4  NEUTROABS 6.2  HGB 14.4  HCT 45.6  MCV 94.0  PLT 0000000    Basic Metabolic Panel: Recent Labs  Lab 01/25/22 2145  NA 138  K 4.1  CL 104  CO2 24  GLUCOSE 116*  BUN 12  CREATININE 1.30*   CALCIUM 8.4*   GFR: Estimated Creatinine Clearance: 100.5 mL/min (A) (by C-G formula based on SCr of 1.3 mg/dL (H)). Recent Labs  Lab 01/25/22 2145  PROCALCITON 0.10  WBC 8.4  LATICACIDVEN 2.2*    Liver Function Tests: Recent Labs  Lab 01/25/22 2145  AST 22  ALT 25  ALKPHOS 58  BILITOT 0.8  PROT 8.0  ALBUMIN 3.8   No results for input(s): LIPASE, AMYLASE in the last 168 hours. No results for input(s): AMMONIA in the last 168 hours.  ABG No results found for: PHART, PCO2ART, PO2ART, HCO3, TCO2, ACIDBASEDEF, O2SAT   Coagulation Profile: Recent Labs  Lab 01/25/22 2145  INR 1.4*    Cardiac Enzymes: No results for input(s): CKTOTAL, CKMB, CKMBINDEX, TROPONINI in the last 168 hours.  HbA1C: Hemoglobin A1C  Date/Time Value Ref Range Status  12/26/2014 05:20 AM 6.1 (H) % Final    Comment:    4.0-6.0 NOTE: New Reference Range  11/05/14   12/25/2014 03:05 AM 5.9 % Final    Comment:    4.0-6.0 NOTE: New Reference Range  11/05/14    Hgb A1c MFr Bld  Date/Time Value Ref Range Status  10/29/2021 09:14 AM 6.5 (H) 4.8 - 5.6 % Final    Comment:             Prediabetes: 5.7 - 6.4          Diabetes: >6.4          Glycemic control for adults with diabetes: <7.0  07/29/2021 02:11 PM 6.3 (H) 4.8 - 5.6 % Final    Comment:             Prediabetes: 5.7 - 6.4          Diabetes: >6.4          Glycemic control for adults with diabetes: <7.0     CBG: No results for input(s): GLUCAP in the last 168 hours.  Review of Systems:   Positives in BOLD: Gen: Denies fever, chills, weight change, fatigue, night sweats HEENT: Denies blurred vision, double vision, hearing loss, tinnitus, sinus congestion, rhinorrhea, sore throat, neck stiffness, dysphagia PULM: Denies shortness of breath, cough, sputum production, hemoptysis, wheezing CV: Denies chest pain, edema, orthopnea, paroxysmal nocturnal dyspnea, palpitations GI: Denies abdominal pain, nausea, vomiting, diarrhea,  hematochezia, melena, constipation, change in bowel habits GU: Denies dysuria, hematuria, polyuria, oliguria, urethral discharge Endocrine: Denies hot or cold intolerance, polyuria, polyphagia or appetite change Derm: Denies rash, dry skin, scaling or peeling skin change Heme: Denies easy bruising, bleeding, bleeding gums Neuro: Denies headache, numbness, weakness, slurred speech, loss of memory or consciousness   Past Medical History:  He,  has a past medical history of CHF (congestive heart failure) ([place]), Diabetes mellitus without complication ([place]), DVT (deep venous thrombosis) ([place]), Hypertension, Peripheral vascular disease ([place]), and Presence of IVC filter.   Surgical History:   Past Surgical History:  Procedure Laterality Date   CARDIAC ELECTROPHYSIOLOGY STUDY AND ABLATION  10/2020   PERIPHERAL VASCULAR CATHETERIZATION N/A 01/10/2015   Procedure: Dialysis/Perma Catheter Insertion;  Surgeon: Katha Cabal, MD;  Location: [facility];  Service: Cardiovascular;  Laterality: N/A;   PERIPHERAL VASCULAR CATHETERIZATION N/A 02/24/2015   Procedure: Dialysis/Perma Catheter Removal;  Surgeon: Algernon Huxley, MD;  Location: [facility];  Service: Cardiovascular;  Laterality: N/A;     Social History:   reports that he has never smoked. He has never used smokeless tobacco. He reports that he does not drink alcohol and does not use drugs.   Family History:  His family history includes Breast cancer in his mother; Cancer in his mother; Diabetes in his father; Hypertension in his father and mother; Prostate cancer in his father; Pulmonary embolism in his paternal grandfather and paternal uncle.   Allergies Allergies  Allergen Reactions   Iodinated Contrast Media Other (See Comments)    Kidney failure requiring dialysis in 2013. He has had cta for PE since 2013.     Metrizamide Other (See Comments)    Kidney failure requiring dialysis Kidney failure requiring dialysis     Clindamycin/Lincomycin Rash   Lincomycin Rash   Sulfa Antibiotics Rash     Home Medications  Prior to Admission medications   Medication Sig Start Date End Date Taking? Authorizing Provider  amLODipine (NORVASC) 10 MG tablet Take 10 mg by mouth daily. 12/04/21  Yes [provider]  empagliflozin (JARDIANCE) 25 MG TABS tablet Take 1 tablet (25 mg total) by mouth daily before breakfast. 12/09/21  Yes Johnson, Megan P, DO  losartan (COZAAR) 50 MG tablet Take 50 mg by mouth daily. 03/11/20  Yes [provider]  metoprolol succinate (TOPROL-XL) 25 MG 24 hr tablet Take 1 tablet by mouth daily.   Yes [provider]  XARELTO 20 MG TABS tablet Take 20 mg by mouth daily. 12/07/21  Yes [provider]  magnesium oxide (MAG-OX) 400 MG tablet Take 1 tablet by mouth in the morning and at bedtime. Patient not taking: Reported on 01/25/2022  10/23/21 10/23/22  [provider]  spironolactone (ALDACTONE) 50 MG tablet Take 0.5 tablets (25 mg total) by mouth daily. Patient not taking: Reported on 01/25/2022 01/04/22   Annita Brod, MD     Critical care time: 50 minutes     Darel Hong, AGACNP-BC [facility] Prefer epic messenger for cross cover needs If after hours, please call E-link

## 2022-01-26 NOTE — Assessment & Plan Note (Signed)
Pt has received cefepime and vancomycin and IVF. We will limit IVF as he has h/o PE and suspect underlying CHF.  Will cont ivf abx per pharmacy protocol.  D/c abx as deemed appropriate.

## 2022-01-26 NOTE — Assessment & Plan Note (Signed)
Pt has h/o VTE since age of 79 and we will transition to heparin gtt while in hospital. Reimage prior to discharge to see if chronic blood clots have resolved.  Consider long term coumadin or get hematology consult for long term plan not sure if he is xarelto failure to at lest a partial extent with pe on cta.

## 2022-01-26 NOTE — Progress Notes (Signed)
ANTICOAGULATION CONSULT NOTE  Pharmacy Consult for heparin infusion Indication: pulmonary embolus  Allergies  Allergen Reactions   Iodinated Contrast Media Other (See Comments)    Kidney failure requiring dialysis in 2013. He has had cta for PE since 2013.     Metrizamide Other (See Comments)    Kidney failure requiring dialysis Kidney failure requiring dialysis    Clindamycin/Lincomycin Rash   Lincomycin Rash   Sulfa Antibiotics Rash    Patient Measurements: Height: 5\' 7"  (170.2 cm) Weight: (!) 168 kg (370 lb 6 oz) IBW/kg (Calculated) : 66.1 Heparin Dosing Weight: 108.2 kg  Vital Signs: Temp: 99.1 F (37.3 C) (05/30 0644) Temp Source: Oral (05/30 0644) BP: 158/102 (05/30 0730) Pulse Rate: 95 (05/30 0730)  Labs: Recent Labs    01/25/22 2145 01/26/22 0141 01/26/22 0620  HGB 14.4  --   --   HCT 45.6  --   --   PLT 204  --   --   APTT 27 83* 45*  LABPROT 16.6* 17.8*  --   INR 1.4* 1.5*  --   HEPARINUNFRC  --  >1.10* >1.10*  CREATININE 1.30*  --  1.60*  TROPONINIHS 34* 41*  --      Estimated Creatinine Clearance: 81.7 mL/min (A) (by C-G formula based on SCr of 1.6 mg/dL (H)).   Medical History: Past Medical History:  Diagnosis Date   CHF (congestive heart failure) (HCC)    Diabetes mellitus without complication (HCC)    per pt-pre diabetic-Dr 01/28/22 stated said pt   DVT (deep venous thrombosis) (HCC)    Hypertension    Peripheral vascular disease (H[facility])    Presence of IVC filter     Medications:  PTA: Xarelto 20 mg q24h, last dose: 5/28 (PM)  Assessment: Pt is 51 yo male presenting to ED c/o SOB, N/V, fever, chills, & R lower leg pain with h/o chronic PE, believed to be failing on Xarelto per admitting MD.  Goal of Therapy:  Heparin level 0.3-0.7 units/ml aPTT 66-108 seconds Monitor platelets by anticoagulation protocol: Yes   Plan:  05/30@0620 : aPTT 45 sec, subtherapeutic Bolus 3300 units x 1 and increase heparin infusion to 2100  units/hr Will follow aPTT until correlation with HL is confirmed Will check aPTT in 6 hr after start of infusion HL & CBC daily while on heparin.  44, PharmD Clinical Pharmacist 01/26/2022 8:11 AM

## 2022-01-26 NOTE — Progress Notes (Signed)
Pharmacy Antibiotic Note  Ryan [name] is a 51 y.o. male admitted on 01/25/2022 with sepsis.  Pharmacy has been consulted for Cefepime and Vancomycin dosing.  Patient's renal function slowly worsening, SCR 1.3>1.6, will CTM  Plan: Cefepime 2 gm q8h   Adjusting to Vancomycin 2000 mg IV Q 24 hrs.  Goal AUC 400-550. Expected AUC: 514.1, Calculated Cssmin: 12.8 SCr used: 1.6 Vd used: 0.5, BMI 58.01  Pharmacy will continue to follow and will adjust abx dosing whenever warranted.   Height: 5\' 7"  (170.2 cm) Weight: (!) 168 kg (370 lb 6 oz) IBW/kg (Calculated) : 66.1  Temp (24hrs), Avg:100.1 F (37.8 C), Min:99.1 F (37.3 C), Max:101.5 F (38.6 C)  Recent Labs  Lab 01/25/22 2145 01/26/22 0045 01/26/22 0620  WBC 8.4  --   --   CREATININE 1.30*  --  1.60*  LATICACIDVEN 2.2* 2.1* 1.4     Estimated Creatinine Clearance: 81.7 mL/min (A) (by C-G formula based on SCr of 1.6 mg/dL (H)).    Allergies  Allergen Reactions   Iodinated Contrast Media Other (See Comments)    Kidney failure requiring dialysis in 2013. He has had cta for PE since 2013.     Metrizamide Other (See Comments)    Kidney failure requiring dialysis Kidney failure requiring dialysis    Clindamycin/Lincomycin Rash   Lincomycin Rash   Sulfa Antibiotics Rash    Antimicrobials this admission: 5/29 Cefepime >>  5/29 Vancomycin >>   Microbiology results: 5/29 BCx: NGTD 5/29 UCx: Pending   Thank you for allowing pharmacy to be a part of this patient's care.  6/29, PharmD Clinical Pharmacist 01/26/2022 7:59 AM

## 2022-01-26 NOTE — Progress Notes (Addendum)
PROGRESS NOTE  Ryan [name]    DOB: 04/11/1971, 51 y.o.  [id number]    Code Status: Full Code   DOA: 01/25/2022   LOS: 0   Brief hospital course  Ryan [name] is a 51 y.o. 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  They presented from home to the ED on 01/25/2022 with shortness of breath x a few days. He does not have home oxygen use. He has known PE on xarelto.  In the ED, it was found that they had hypoxia of 84% ORA, tachycardia 110, temperature 101.5.  Significant findings included negative respiratory panel. LA 2.2, Na 138, K 4.1, Cr 1.3 which appears to be about baseline. CBC unremarkable. BNP 35.5. troponin 34.  Urinalysis negative for infection, positive >500 glucose.  Blood and urine cultures pending. Cardiology and pulmonology were consulted.   They were initially treated with IVF, cefepime, vancomycin. Started on heparin gtt.  Patient was admitted to medicine service for further workup and management of acute hypoxic respiratory failure as outlined in detail below.  01/26/22 -stable  Assessment & Plan  Principal Problem:   SOB (shortness of breath) Active Problems:   Acute respiratory failure with hypoxia (HCC)   Pulmonary emboli (HCC)   Severe sepsis (HCC)   DVT (deep venous thrombosis) (HCC)   Chronic diastolic CHF (congestive heart failure) (HCC)   HTN (hypertension)   Diabetes mellitus without complication (HCC)   CKD (chronic kidney disease), stage IIIa  Acute hypoxic respiratory failure  known PE  PNA- was on xarelto and prior was on eliquis. Has IVC in place. Respiratory panel negative. Patient has reported contrast allergy. ECG not viewable in chart.  - pulmonology and cardiology consulted, appreciate recs - continue heparin gtt - wean oxygen as tolerated - continue IV Abx -  breathing tx PRN - f/u Cx - MRSA screening and deescalate Abx as able - PT/OT  HFpEF  HTN  elevated troponin- echo 5/6 EF 60-65%. Troponin 34>41 - f/u echo - cardiology consulted on admission, appreciate recommendations - continue home antihypertensives  Type II DM - sSSI with meals  AKI on CKDIIIa- Cr 1.3>1.6 worsening. Potentially related to fluid overload vs antibiotic - BMP  Body mass index is 58.01 kg/m.  VTE ppx: SCDs Start: 01/25/22 2352  Diet:     Diet   Diet NPO time specified Except for: Sips with Meds   Consultants: Pulmonology Cardiology   Subjective 01/26/22    Pt reports continued dyspnea.    Objective   Vitals:   01/26/22 0200 01/26/22 0600 01/26/22 0644 01/26/22 0730  BP: (!) 171/106 (!) 167/94 (!) 167/94 (!) 158/102  Pulse: (!) 117 99 99 95  Resp: (!) 30  (!) 25   Temp:   99.1 F (37.3 C)   TempSrc:   Oral   SpO2: 96% 90% 93% 99%  Weight:      Height:        Intake/Output Summary (Last 24 hours) at 01/26/2022 0832 Last data filed at 01/26/2022 0645 Gross per 24 hour  Intake 2031.39 ml  Output --  Net 2031.39 ml   Filed Weights   01/25/22 2136  Weight: (!) 168 kg     Physical Exam:  General: awake, alert, NAD HEENT: atraumatic, clear conjunctiva, anicteric sclera, MMM, hearing grossly normal Respiratory: mild rhonchi diffusely, improved with cough Cardiovascular: quick  capillary refill, normal S1/S2, RRR, no JVD, murmurs Nervous: A&O x3. no gross focal neurologic deficits, normal speech Extremities: moves all equally, trace edema, normal tone Skin: dry, intact, normal temperature, normal color. No rashes, lesions or ulcers on exposed skin  Labs   I have personally reviewed the following labs and imaging studies CBC    Component Value Date/Time   WBC 8.4 01/25/2022 2145   RBC 4.85 01/25/2022 2145   HGB 14.4 01/25/2022 2145   HGB 16.5 10/29/2021 0914   HCT 45.6 01/25/2022 2145   HCT 50.0 10/29/2021 0914   PLT 204  01/25/2022 2145   PLT 358 10/29/2021 0914   MCV 94.0 01/25/2022 2145   MCV 92 10/29/2021 0914   MCV 90 12/28/2014 0444   MCH 29.7 01/25/2022 2145   MCHC 31.6 01/25/2022 2145   RDW 14.8 01/25/2022 2145   RDW 12.9 10/29/2021 0914   RDW 14.0 12/28/2014 0444   LYMPHSABS 1.2 01/25/2022 2145   LYMPHSABS 1.5 10/29/2021 0914   LYMPHSABS 1.3 12/28/2014 0444   MONOABS 0.8 01/25/2022 2145   MONOABS 2.1 (H) 12/28/2014 0444   EOSABS 0.1 01/25/2022 2145   EOSABS 0.2 10/29/2021 0914   EOSABS 0.1 12/28/2014 0444   BASOSABS 0.0 01/25/2022 2145   BASOSABS 0.1 10/29/2021 0914   BASOSABS 0.1 12/28/2014 0444      Latest Ref Rng & Units 01/26/2022    6:20 AM 01/25/2022    9:45 PM 01/04/2022    5:21 AM  BMP  Glucose 70 - 99 mg/dL 118   116   102    BUN 6 - 20 mg/dL 12   12   19     Creatinine 0.61 - 1.24 mg/dL 1.60   1.30   1.33    Sodium 135 - 145 mmol/L 136   138   137    Potassium 3.5 - 5.1 mmol/L 4.2   4.1   3.9    Chloride 98 - 111 mmol/L 103   104   104    CO2 22 - 32 mmol/L 23   24   26     Calcium 8.9 - 10.3 mg/dL 8.6   8.4   8.5      DG Chest Port 1 View  Result Date: 01/25/2022 CLINICAL DATA:  Cough, short of breath, chest pain EXAM: PORTABLE CHEST 1 VIEW COMPARISON:  12/31/2021 FINDINGS: The heart size and mediastinal contours are within normal limits. Both lungs are clear. The visualized skeletal structures are unremarkable. IMPRESSION: No active disease. Electronically Signed   By: Randa Ngo M.D.   On: 01/25/2022 21:48    Disposition Plan & Communication  Patient status: Inpatient  Admitted From: Home Planned disposition location: Home Anticipated discharge date: 6/2 pending respiratory improvement  Family Communication: none    Author: Richarda Osmond, DO [facility] 01/26/2022, 8:32 AM   Available by Epic secure chat 7AM-7PM. If 7PM-7AM, please contact night-coverage.  [facility] contact information found on [email or web address].

## 2022-01-26 NOTE — Progress Notes (Deleted)
ANTICOAGULATION CONSULT NOTE  Pharmacy Consult for heparin infusion Indication: pulmonary embolus  Allergies  Allergen Reactions   Iodinated Contrast Media Other (See Comments)    Kidney failure requiring dialysis in 2013. He has had cta for PE since 2013.     Metrizamide Other (See Comments)    Kidney failure requiring dialysis Kidney failure requiring dialysis    Clindamycin/Lincomycin Rash   Lincomycin Rash   Sulfa Antibiotics Rash    Patient Measurements: Height: 5\' 7"  (170.2 cm) Weight: (!) 168 kg (370 lb 6 oz) IBW/kg (Calculated) : 66.1 Heparin Dosing Weight: 108.2 kg  Vital Signs: Temp: 99.1 F (37.3 C) (05/30 0644) Temp Source: Oral (05/30 0644) BP: 158/102 (05/30 0730) Pulse Rate: 95 (05/30 0730)  Labs: Recent Labs    01/25/22 2145 01/26/22 0141 01/26/22 0620  HGB 14.4  --   --   HCT 45.6  --   --   PLT 204  --   --   APTT 27 83* 45*  LABPROT 16.6* 17.8*  --   INR 1.4* 1.5*  --   HEPARINUNFRC  --  >1.10* >1.10*  CREATININE 1.30*  --  1.60*  TROPONINIHS 34* 41*  --     Estimated Creatinine Clearance: 81.7 mL/min (A) (by C-G formula based on SCr of 1.6 mg/dL (H)).   Medical History: Past Medical History:  Diagnosis Date   CHF (congestive heart failure) ([place])    Diabetes mellitus without complication ([place])    per pt-pre diabetic-Dr Rosario Jacks stated said pt   DVT (deep venous thrombosis) ([facility])    Hypertension    Peripheral vascular disease ([place])    Presence of IVC filter     Medications:  PTA: Xarelto 20 mg q24h, last dose: 5/28 (PM)  Assessment: Pt is 51 yo male presenting to ED c/o SOB, N/V, fever, chills, & R lower leg pain with h/o chronic PE, believed to be failing on Xarelto per admitting MD.  Goal of Therapy:  Heparin level 0.3-0.7 units/ml aPTT 66-108 seconds Monitor platelets by anticoagulation protocol: Yes   Plan:  HL >1.10 Hold heparin drip for 1 hour then restart infusion at 1350 units/hr Will follow aPTT until correlation  with HL is confirmed Will check aPTT in 6 hr after start of infusion HL & CBC daily while on heparin.  Darrick Penna, PharmD, MS PGPM Clinical Pharmacist 01/26/2022 8:02 AM

## 2022-01-26 NOTE — Consult Note (Addendum)
PHARMACY CONSULT NOTE - FOLLOW UP  Pharmacy Consult for Electrolyte Monitoring and Replacement   Recent Labs: Potassium (mmol/L)  Date Value  01/26/2022 4.2  12/28/2014 3.8   Magnesium (mg/dL)  Date Value  40/97/3532 1.7  12/28/2014 2.7 (H)   Calcium (mg/dL)  Date Value  99/24/2683 8.6 (L)   Calcium, Total (mg/dL)  Date Value  41/96/2229 8.1 (L)   Albumin (g/dL)  Date Value  79/89/2119 3.8  10/29/2021 4.5  12/23/2014 3.9   Phosphorus (mg/dL)  Date Value  41/74/0814 3.8  12/28/2014 6.1 (H)   Sodium (mmol/L)  Date Value  01/26/2022 136  10/29/2021 137  12/28/2014 130 (L)     Assessment: Ryan [name] is a 51 y.o. 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 Pharmacy consulted for electrolyte monitoring and replacement  Goal of Therapy:  K > 4 mEq/L Mg >2 mg/dL Electrolytes WNL  Plan:  Mg 1.7. Give Mag Sulfate IV 2g x1  Will order BMP, MG, and phos for tomorrow  Selinda Eon ,PharmD Clinical Pharmacist 01/26/2022 11:38 AM

## 2022-01-26 NOTE — Progress Notes (Signed)
ANTICOAGULATION CONSULT NOTE  Pharmacy Consult for heparin infusion Indication: pulmonary embolus  Allergies  Allergen Reactions   Iodinated Contrast Media Other (See Comments)    Kidney failure requiring dialysis in 2013. He has had cta for PE since 2013.     Metrizamide Other (See Comments)    Kidney failure requiring dialysis Kidney failure requiring dialysis    Clindamycin/Lincomycin Rash   Lincomycin Rash   Sulfa Antibiotics Rash    Patient Measurements: Height: 5\' 7"  (170.2 cm) Weight: (!) 168 kg (370 lb 6 oz) IBW/kg (Calculated) : 66.1 Heparin Dosing Weight: 108.2 kg  Vital Signs: Temp: 101.5 F (38.6 C) (05/29 2135) Temp Source: Oral (05/29 2135) BP: 180/113 (05/29 2330) Pulse Rate: 120 (05/29 2330)  Labs: Recent Labs    01/25/22 2145  HGB 14.4  HCT 45.6  PLT 204  APTT 27  LABPROT 16.6*  INR 1.4*  CREATININE 1.30*  TROPONINIHS 34*    Estimated Creatinine Clearance: 100.5 mL/min (A) (by C-G formula based on SCr of 1.3 mg/dL (H)).   Medical History: Past Medical History:  Diagnosis Date   CHF (congestive heart failure) ([place])    Diabetes mellitus without complication ([place])    per pt-pre diabetic-Dr Rosario Jacks stated said pt   DVT (deep venous thrombosis) ([facility])    Hypertension    Peripheral vascular disease ([place])    Presence of IVC filter     Medications:  PTA: Xarelto 20 mg q24h, last dose: 5/28 (PM)  Assessment: Pt is 51 yo male presenting to ED c/o SOB, N/V, fever, chills, & R lower leg pain with h/o chronic PE, believed to be failing on Xarelto per admitting MD.  Goal of Therapy:  Heparin level 0.3-0.7 units/ml aPTT 66-108 seconds Monitor platelets by anticoagulation protocol: Yes   Plan:  Bolus 7000 units x 1 Start heparin infusion at 1750 units/hr Will follow aPTT until correlation with HL is confirmed Will check aPTT in 6 hr after start of infusion HL & CBC daily while on heparin.  Renda Rolls, PharmD, MBA 01/26/2022 12:11  AM

## 2022-01-26 NOTE — Assessment & Plan Note (Signed)
continue metoprolol.  PRN hydralazine.

## 2022-01-26 NOTE — Progress Notes (Signed)
ANTICOAGULATION CONSULT NOTE  Pharmacy Consult for heparin infusion Indication: pulmonary embolus  Allergies  Allergen Reactions   Iodinated Contrast Media Other (See Comments)    Kidney failure requiring dialysis in 2013. He has had cta for PE since 2013.     Metrizamide Other (See Comments)    Kidney failure requiring dialysis Kidney failure requiring dialysis    Clindamycin/Lincomycin Rash   Lincomycin Rash   Sulfa Antibiotics Rash    Patient Measurements: Height: 5\' 7"  (170.2 cm) Weight: (!) 167.6 kg (369 lb 6.4 oz) IBW/kg (Calculated) : 66.1 Heparin Dosing Weight: 108.2 kg  Vital Signs: Temp: 99 F (37.2 C) (05/30 1930) BP: 159/98 (05/30 1930) Pulse Rate: 85 (05/30 1930)  Labs: Recent Labs    01/25/22 2145 01/26/22 0141 01/26/22 0620 01/26/22 1400 01/26/22 1947  HGB 14.4  --   --   --   --   HCT 45.6  --   --   --   --   PLT 204  --   --   --   --   APTT 27 83* 45* 66* 58*  LABPROT 16.6* 17.8*  --   --   --   INR 1.4* 1.5*  --   --   --   HEPARINUNFRC  --  >1.10* >1.10*  --   --   CREATININE 1.30*  --  1.60*  --   --   TROPONINIHS 34* 41*  --   --   --      Estimated Creatinine Clearance: 81.5 mL/min (A) (by C-G formula based on SCr of 1.6 mg/dL (H)).   Medical History: Past Medical History:  Diagnosis Date   CHF (congestive heart failure) ([facility])    Diabetes mellitus without complication ([facility])    per pt-pre diabetic-Dr 01/28/22 stated said pt   DVT (deep venous thrombosis) ([facility])    Hypertension    Peripheral vascular disease ([facility])    Presence of IVC filter   Heparin Dosing Weight: 108.2 kg  Medications:  PTA: Xarelto 20 mg q24h, last dose: 5/28 (PM)  Assessment: Pt is 51 yo male presenting to ED c/o SOB, N/V, fever, chills, & R lower leg pain with h/o chronic PE, believed to be failing on Xarelto per admitting MD.  Date Time aPTT/HL Rate/Comment 530 0141 83s / >1.1 Thera x1; 1750 un/hr 5/30 0620 45s / >1.1 Subthera; 1750 > 2100  un/hr 5/30 1400 66s / --  therapeutic x1; 2100 un/hr    5/30 1947 58s / --  Subtherapeutic; 2100 > 2300 un/hr  Baseline Labs: aPTT - 27s INR - 1.4 Hgb - 14.4 Plts - 204  Goal of Therapy:  Heparin level 0.3-0.7 units/ml aPTT 66-108 seconds Monitor platelets by anticoagulation protocol: Yes   Plan:  aPTT is slightly subtherapeutic; last HL not correlating yet so recheck in AM. Give 1600 units bolus x1; then increase heparin infusion rate to 2300 units/hr Check aPTT/Anti-Xa level in 6 hours and daily once consecutively therapeutic.  Titrate by aPTT's until lab correlation is noted, then titrate by anti-xa alone. Continue to monitor H&H and platelets daily while on heparin gtt.  6/30, PharmD, [facility] Clinical Pharmacist 01/26/2022 8:46 PM

## 2022-01-26 NOTE — Assessment & Plan Note (Signed)
Currently [pt on metoprolol. PRN hydralazine.  HOLD AMLODIPINE. HOLD LOSARTAN AND ALDACTONE.

## 2022-01-26 NOTE — Assessment & Plan Note (Addendum)
S/p IVC filter on Xarelto prior to admission. Discharged on home Xarelto.  Advised to take Xarelto with food.

## 2022-01-27 DIAGNOSIS — I5032 Chronic diastolic (congestive) heart failure: Secondary | ICD-10-CM | POA: Diagnosis not present

## 2022-01-27 DIAGNOSIS — Z6841 Body Mass Index (BMI) 40.0 and over, adult: Secondary | ICD-10-CM

## 2022-01-27 DIAGNOSIS — J9601 Acute respiratory failure with hypoxia: Secondary | ICD-10-CM | POA: Diagnosis not present

## 2022-01-27 DIAGNOSIS — Z9189 Other specified personal risk factors, not elsewhere classified: Secondary | ICD-10-CM

## 2022-01-27 DIAGNOSIS — Z86711 Personal history of pulmonary embolism: Secondary | ICD-10-CM | POA: Diagnosis not present

## 2022-01-27 DIAGNOSIS — R5381 Other malaise: Secondary | ICD-10-CM

## 2022-01-27 DIAGNOSIS — N1831 Chronic kidney disease, stage 3a: Secondary | ICD-10-CM

## 2022-01-27 DIAGNOSIS — A419 Sepsis, unspecified organism: Secondary | ICD-10-CM | POA: Diagnosis not present

## 2022-01-27 LAB — APTT
aPTT: 70 seconds — ABNORMAL HIGH (ref 24–36)
aPTT: 47 seconds — ABNORMAL HIGH (ref 24–36)
aPTT: 66 seconds — ABNORMAL HIGH (ref 24–36)

## 2022-01-27 LAB — CBC
HCT: 45.6 % (ref 39.0–52.0)
Hemoglobin: 14.2 g/dL (ref 13.0–17.0)
MCH: 29.5 pg (ref 26.0–34.0)
MCHC: 31.1 g/dL (ref 30.0–36.0)
MCV: 94.8 fL (ref 80.0–100.0)
Platelets: 166 10*3/uL (ref 150–400)
RBC: 4.81 MIL/uL (ref 4.22–5.81)
RDW: 15.2 % (ref 11.5–15.5)
WBC: 5.4 10*3/uL (ref 4.0–10.5)
nRBC: 0 % (ref 0.0–0.2)

## 2022-01-27 LAB — PROCALCITONIN: Procalcitonin: 0.95 ng/mL

## 2022-01-27 LAB — BASIC METABOLIC PANEL
Anion gap: 11 (ref 5–15)
BUN: 13 mg/dL (ref 6–20)
CO2: 19 mmol/L — ABNORMAL LOW (ref 22–32)
Calcium: 8.2 mg/dL — ABNORMAL LOW (ref 8.9–10.3)
Chloride: 104 mmol/L (ref 98–111)
Creatinine, Ser: 1.39 mg/dL — ABNORMAL HIGH (ref 0.61–1.24)
GFR, Estimated: >60 mL/min (ref >60)
Glucose, Bld: 111 mg/dL — ABNORMAL HIGH (ref 70–99)
Potassium: 4 mmol/L (ref 3.5–5.1)
Sodium: 134 mmol/L — ABNORMAL LOW (ref 135–145)

## 2022-01-27 LAB — GLUCOSE, CAPILLARY
Glucose-Capillary: 150 mg/dL — ABNORMAL HIGH (ref 70–99)
Glucose-Capillary: 148 mg/dL — ABNORMAL HIGH (ref 70–99)
Glucose-Capillary: 117 mg/dL — ABNORMAL HIGH (ref 70–99)

## 2022-01-27 LAB — PHOSPHORUS: Phosphorus: 3.1 mg/dL (ref 2.5–4.6)

## 2022-01-27 LAB — URINE CULTURE: Culture: 30000 — AB

## 2022-01-27 LAB — MRSA NEXT GEN BY PCR, NASAL: MRSA by PCR Next Gen: NOT DETECTED

## 2022-01-27 LAB — HEPARIN LEVEL (UNFRACTIONATED)
Heparin Unfractionated: 0.56 IU/mL (ref 0.30–0.70)
Heparin Unfractionated: 0.37 IU/mL (ref 0.30–0.70)
Heparin Unfractionated: 0.51 IU/mL (ref 0.30–0.70)

## 2022-01-27 LAB — MAGNESIUM: Magnesium: 2.2 mg/dL (ref 1.7–2.4)

## 2022-01-27 MED ORDER — HEPARIN BOLUS VIA INFUSION
1600.0000 [IU] | Freq: Once | INTRAVENOUS | Status: AC
  Administered 2022-01-27: 1600 [IU] via INTRAVENOUS
  Filled 2022-01-27: qty 1600

## 2022-01-27 MED ORDER — SODIUM CHLORIDE 0.9 % IV SOLN
2.0000 g | INTRAVENOUS | Status: DC
  Administered 2022-01-27: 2 g via INTRAVENOUS
  Filled 2022-01-27 (×2): qty 20

## 2022-01-27 MED ORDER — AZITHROMYCIN 250 MG PO TABS
500.0000 mg | ORAL_TABLET | Freq: Every day | ORAL | Status: DC
  Administered 2022-01-27 – 2022-01-28 (×2): 500 mg via ORAL
  Filled 2022-01-27 (×2): qty 2

## 2022-01-27 NOTE — Hospital Course (Signed)
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  Desaturated to 84% on RA and started on supplemental oxygen.  Concern about pulmonary hypertension in the setting of known PE, pneumonia and OSA/OHS.  He was started on broad-spectrum antibiotics.  MRSA PCR screen, RVP, COVID-19, influenza PCR and blood cultures negative.  Urine culture with multiple species.  Respiratory symptoms improved.  Antibiotics de-escalated to IV ceftriaxone and azithromycin on 5/31.  Patient's respiratory symptoms improved although he required 2 L by [place] to maintain appropriate saturation with exertion.  He is discharged on p.o. Augmentin and azithromycin to complete 5 days course.  We have changed his Aldactone to Lasix for his diastolic CHF.  We also stopped his amlodipine given his lower extremity edema and venous insufficiency.  He was advised to take his Xarelto with meals to ensure good observation.  Ambulatory referral to hematology and oncology ordered for further evaluation.  Pulmonology to arrange outpatient follow-up.  Patient needs referral to sleep clinic to rule out sleep apnea.  He also needs to lose some weight.

## 2022-01-27 NOTE — Progress Notes (Signed)
OT Cancellation Note  Patient Details Name: Ryan [name] MRN: [id number] DOB: 07-20-1971   Cancelled Treatment:    Reason Eval/Treat Not Completed: OT screened, no needs identified, will sign off. Per PT pt amb without AD, pt reports back to baseline in ADL completion and no concerns in regards to ADL status. Pt in agreement for OT to screen, sign off at this time. Orders completed, please reconsult if there is a change in functional status.   Oleta Mouse, OTD OTR/L  01/27/22, 3:01 PM

## 2022-01-27 NOTE — Assessment & Plan Note (Signed)
Body mass index is 57.86 kg/m. Encourage lifestyle change to lose weight. Could benefit from GLP-1 agonist

## 2022-01-27 NOTE — Progress Notes (Signed)
ANTICOAGULATION CONSULT NOTE  Pharmacy Consult for heparin infusion Indication: pulmonary embolus  Allergies  Allergen Reactions   Iodinated Contrast Media Other (See Comments)    Kidney failure requiring dialysis in 2013. He has had cta for PE since 2013.     Metrizamide Other (See Comments)    Kidney failure requiring dialysis Kidney failure requiring dialysis    Clindamycin/Lincomycin Rash   Lincomycin Rash   Sulfa Antibiotics Rash    Patient Measurements: Height: 5\' 7"  (170.2 cm) Weight: (!) 167.6 kg (369 lb 6.4 oz) IBW/kg (Calculated) : 66.1 Heparin Dosing Weight: 108.2 kg  Vital Signs: Temp: 98.3 F (36.8 C) (05/31 1939) Temp Source: Oral (05/31 1655) BP: 124/83 (05/31 1939) Pulse Rate: 81 (05/31 1939)  Labs: Recent Labs    01/25/22 2145 01/25/22 2145 01/26/22 0141 01/26/22 0620 01/26/22 1400 01/27/22 0515 01/27/22 0604 01/27/22 1228 01/27/22 2012  HGB 14.4  --   --   --   --  14.2  --   --   --   HCT 45.6  --   --   --   --  45.6  --   --   --   PLT 204  --   --   --   --  166  --   --   --   APTT 27  --  83* 45*   < >  --  70* 47* 66*  LABPROT 16.6*  --  17.8*  --   --   --   --   --   --   INR 1.4*  --  1.5*  --   --   --   --   --   --   HEPARINUNFRC  --    < > >1.10* >1.10*  --   --  0.56 0.37 0.51  CREATININE 1.30*  --   --  1.60*  --  1.39*  --   --   --   TROPONINIHS 34*  --  41*  --   --   --   --   --   --    < > = values in this interval not displayed.     Estimated Creatinine Clearance: 93.8 mL/min (A) (by C-G formula based on SCr of 1.39 mg/dL (H)).   Medical History: Past Medical History:  Diagnosis Date   CHF (congestive heart failure) ([facility])    Diabetes mellitus without complication ([facility])    per pt-pre diabetic-Dr 01/29/22 stated said pt   DVT (deep venous thrombosis) ([facility])    Hypertension    Peripheral vascular disease ([facility])    Presence of IVC filter   Heparin Dosing Weight: 108.2 kg  Medications:  PTA: Xarelto 20 mg q24h,  last dose: 5/28 (PM)  Assessment: Pt is 51 yo male presenting to ED c/o SOB, N/V, fever, chills, & R lower leg pain with h/o chronic PE, believed to be failing on Xarelto per admitting MD.  Date Time aPTT/HL Rate/Comment 530 0141 83s / >1.1 Thera x1; 1750 un/hr 5/30 0620 45s / >1.1 Subthera; 1750 > 2100 un/hr 5/30 1400 66s / --  therapeutic x1; 2100 un/hr    5/30 1947 58s / --  Subtherapeutic; 2100 > 2300 un/hr 5/31 0604 70s / 0.56 Therapeutic x 1 5/31 1228 47/0.37 No correlating and aPTT is subtherapeutic.  5/31 2012 66/ 0.51 Therapeutic x 1    Goal of Therapy:  Heparin level 0.3-0.7 units/ml aPTT 66-108 seconds Monitor platelets by anticoagulation protocol: Yes  Plan:  aPTT and HL therapeutic and correlating Continue heparin infusion at 2500 units/hr.  Recheck confirmatory heparin level in 6 hours. CBC daily   Sharen Hones, PharmD, BCPS Clinical Pharmacist   01/27/2022 8:38 PM

## 2022-01-27 NOTE — Progress Notes (Signed)
PROGRESS NOTE  Ryan [name] [id number] DOB: [birth year]   PCP: Jon Billings, NP  Patient is from: Home.  Independently ambulates at baseline.  DOA: 01/25/2022 LOS: 1  Chief complaints Chief Complaint  Patient presents with   Shortness of Breath     Brief Narrative / Interim history: 51 year old M with PMH of diastolic CHF, A-fib/flutter s/p ablation on Xarelto, DVT/PE s/p IVC filter at the age of 61, venous insufficiency, DM-2 and morbid obesity presenting with shortness of breath and admitted for acute respiratory failure with hypoxia.  Desaturated to 84% on RA and started on supplemental oxygen.  Concern about pulmonary hypertension in the setting of known PE, pneumonia and OSA/OHS.  He was started on broad-spectrum antibiotics.  MRSA PCR screen, RVP, COVID-19, influenza PCR and blood cultures negative.  Urine culture with multiple species.  Respiratory symptoms improved.  Antibiotics de-escalated to IV ceftriaxone and azithromycin on 5/31.   Subjective: Seen and examined earlier this morning.  No major events overnight of this morning.  He feels his breathing is better.  He says he did not feel fever or chills.  No chest pain.   Objective: Vitals:   01/27/22 0758 01/27/22 0900 01/27/22 1124 01/27/22 1506  BP: (!) 129/95  (!) 135/92   Pulse: 80  92   Resp: 18  17   Temp: 98.3 F (36.8 C)  98.1 F (36.7 C)   TempSrc: Oral  Oral   SpO2: 95% 92% 92% 95%  Weight:      Height:        Examination:  GENERAL: No apparent distress.  Nontoxic. HEENT: MMM.  Vision and hearing grossly intact.  NECK: Supple.  Difficult to assess JVD due to body habitus. RESP:  No IWOB.  Fair aeration bilaterally but limited exam due to body habitus.. CVS:  RRR. Heart sounds normal.  ABD/GI/GU: BS+. Abd soft, NTND.  MSK/EXT:  Moves extremities. No apparent deformity.  BLE edema and venous insufficiency SKIN: Skin changes in keeping with venous insufficiency. NEURO: Awake, alert and oriented  appropriately.  No apparent focal neuro deficit. PSYCH: Calm. Normal affect.   Procedures:  None  Microbiology summarized: [id number], MRSA PCR screen, influenza PCR, blood culture negative. Urine culture with multiple species.  Assessment and Plan: * Acute respiratory failure with hypoxia (HCC) Desaturated to 84% on RA and started on supplemental oxygen.  Multifactorial including pulmonary hypertension from known PE, pneumonia,  possible OSA/OHS and diastolic CHF.  CXR without acute finding.  Procalcitonin elevated to 0.95.  He reports good compliance with Xarelto.  Has not been evaluated for sleep apnea.  Overall, reports improvement with broad-spectrum antibiotics.  Recent TTE on 5/6 with LVEF of 60 to 65%, G2-DD and RVSP of 30 mmHg.  Difficult to assess fluid status due to body habitus. -De-escalate antibiotics to IV ceftriaxone and azithromycin -Continue IV heparin for chronic PE/VTE and A-fib. -Incentive spirometry, OOB, ambulatory saturation, PT/OT -Consider diuretics once renal function improves   Pulmonary emboli (HCC) History of DVT and PE s/p IVC filter.  Has been on Xarelto. -Continue IV heparin in-house.   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 -Continue IV heparin in house  Chronic diastolic CHF (congestive heart failure) (HCC) continue metoprolol.  PRN hydralazine.   Morbid obesity with BMI of 50.0-59.9, adult (HCC) Body mass index is 57.86 kg/m. Encourage lifestyle change to lose weight. Could benefit from GLP-1 agonist  Severe sepsis (HCC) Likely due to pneumonia.  Diabetes mellitus without complication (HCC) [id number] about 3 months ago. Recent Labs  Lab 01/26/22 1216 01/26/22 1623 01/27/22 0918 01/27/22 1125  GLUCAP 99 130* 150* 148*  -Continue current SSI -Hold Jardiance in the setting of possible infection -Check hemoglobin A1c -Would benefit from statin unless contraindicated    HTN  (hypertension) Continue Toprol-XL. D/c amlodipine-not a great option given his chronic venous sufficiency and BLE edema. Hold home losartan in the setting of AKI   CKD (chronic kidney disease), stage IIIa Recent Labs    06/05/21 0503 06/06/21 0429 07/29/21 1411 10/29/21 0914 12/31/21 1828 01/01/22 0828 01/02/22 0500 01/04/22 0521 01/25/22 2145 01/26/22 0620 01/27/22 0515  BUN 17 18 13 18 16   --  15 19 12 12 13   CREATININE 1.12 1.38* 1.43* 1.57* 1.42* 1.44* 1.41* 1.33* 1.30* 1.60* 1.39*  Creatinine seems to be at baseline now. Continue monitoring    At risk for sleep apnea Encourage outpatient sleep study  Physical deconditioning PT/OT eval  SOB (shortness of breath)      DVT prophylaxis:  On IV heparin for anticoagulation.  Code Status: Full code Family Communication: Patient and/or RN. Available if any question.  Level of care: Telemetry Cardiac Status is: Inpatient Remains inpatient appropriate because: Due to acute respiratory failure with hypoxia   Final disposition: Home once medically clear. Consultants:  Pulmonology  Sch Meds:  Scheduled Meds:  azithromycin  500 mg Oral Daily   insulin aspart  0-9 Units Subcutaneous TID WC   metoprolol succinate  25 mg Oral Daily   pantoprazole (PROTONIX) IV  40 mg Intravenous QHS   Continuous Infusions:  sodium chloride Stopped (01/27/22 1337)   cefTRIAXone (ROCEPHIN)  IV     heparin 2,500 Units/hr (01/27/22 1445)   PRN Meds:.polyethylene glycol  Antimicrobials: Anti-infectives (From admission, onward)    Start     Dose/Rate Route Frequency Ordered Stop   01/27/22 1800  cefTRIAXone (ROCEPHIN) 2 g in sodium chloride 0.9 % 100 mL IVPB        2 g 200 mL/hr over 30 Minutes Intravenous Every 24 hours 01/27/22 1315     01/27/22 1415  azithromycin (ZITHROMAX) tablet 500 mg        500 mg Oral Daily 01/27/22 1315 01/31/22 0959   01/26/22 2000  vancomycin (VANCOREADY) IVPB 2000 mg/400 mL  Status:  Discontinued         2,000 mg 200 mL/hr over 120 Minutes Intravenous Every 24 hours 01/26/22 0753 01/27/22 1208   01/26/22 1000  vancomycin (VANCOREADY) IVPB 1250 mg/250 mL  Status:  Discontinued        1,250 mg 166.7 mL/hr over 90 Minutes Intravenous Every 12 hours 01/26/22 0149 01/26/22 0753   01/26/22 0600  ceFEPIme (MAXIPIME) 2 g in sodium chloride 0.9 % 100 mL IVPB  Status:  Discontinued        2 g 200 mL/hr over 30 Minutes Intravenous Every 8 hours 01/26/22 0118 01/27/22 1315   01/26/22 0115  vancomycin (VANCOREADY) IVPB 1500 mg/300 mL  Status:  Discontinued        1,500 mg 150 mL/hr over 120 Minutes Intravenous  Once 01/26/22 0111 01/26/22 0114   01/26/22 0115  vancomycin (VANCOREADY) IVPB 1250 mg/250 mL        1,250 mg 166.7 mL/hr over 90 Minutes Intravenous  Once 01/26/22 0114 01/26/22 0341   01/25/22 2130  vancomycin (VANCOCIN) IVPB 1000 mg/200 mL premix        1,000 mg 200 mL/hr over 60 Minutes Intravenous  Once 01/25/22 2128 01/25/22 2313   01/25/22 2130  ceFEPIme (MAXIPIME) 2 g in sodium chloride 0.9 % 100 mL IVPB        2 g 200 mL/hr over 30 Minutes Intravenous  Once 01/25/22 2128 01/25/22 2313        I have personally reviewed the following labs and images: CBC: Recent Labs  Lab 01/25/22 2145 01/27/22 0515  WBC 8.4 5.4  NEUTROABS 6.2  --   HGB 14.4 14.2  HCT 45.6 45.6  MCV 94.0 94.8  PLT 204 166   BMP &GFR Recent Labs  Lab 01/25/22 2145 01/26/22 0620 01/27/22 0515  NA 138 136 134*  K 4.1 4.2 4.0  CL 104 103 104  CO2 24 23 19*  GLUCOSE 116* 118* 111*  BUN 12 12 13   CREATININE 1.30* 1.60* 1.39*  CALCIUM 8.4* 8.6* 8.2*  MG  --  1.7 2.2  PHOS  --   --  3.1   Estimated Creatinine Clearance: 93.8 mL/min (A) (by C-G formula based on SCr of 1.39 mg/dL (H)). Liver & Pancreas: Recent Labs  Lab 01/25/22 2145  AST 22  ALT 25  ALKPHOS 58  BILITOT 0.8  PROT 8.0  ALBUMIN 3.8   No results for input(s): LIPASE, AMYLASE in the last 168 hours. No results for  input(s): AMMONIA in the last 168 hours. Diabetic: No results for input(s): HGBA1C in the last 72 hours. Recent Labs  Lab 01/26/22 1216 01/26/22 1623 01/27/22 0918 01/27/22 1125  GLUCAP 99 130* 150* 148*   Cardiac Enzymes: No results for input(s): CKTOTAL, CKMB, CKMBINDEX, TROPONINI in the last 168 hours. No results for input(s): PROBNP in the last 8760 hours. Coagulation Profile: Recent Labs  Lab 01/25/22 2145 01/26/22 0141  INR 1.4* 1.5*   Thyroid Function Tests: No results for input(s): TSH, T4TOTAL, FREET4, T3FREE, THYROIDAB in the last 72 hours. Lipid Profile: No results for input(s): CHOL, HDL, LDLCALC, TRIG, CHOLHDL, LDLDIRECT in the last 72 hours. Anemia Panel: No results for input(s): VITAMINB12, FOLATE, FERRITIN, TIBC, IRON, RETICCTPCT in the last 72 hours. Urine analysis:    Component Value Date/Time   COLORURINE STRAW (A) 01/25/2022 2200   APPEARANCEUR CLEAR (A) 01/25/2022 2200   APPEARANCEUR Hazy 12/25/2014 1655   LABSPEC 1.015 01/25/2022 2200   LABSPEC 1.012 12/25/2014 1655   PHURINE 6.0 01/25/2022 2200   GLUCOSEU >=500 (A) 01/25/2022 2200   GLUCOSEU Negative 12/25/2014 1655   HGBUR SMALL (A) 01/25/2022 2200   BILIRUBINUR NEGATIVE 01/25/2022 2200   BILIRUBINUR Negative 12/25/2014 1655   KETONESUR NEGATIVE 01/25/2022 2200   PROTEINUR 30 (A) 01/25/2022 2200   NITRITE NEGATIVE 01/25/2022 2200   LEUKOCYTESUR NEGATIVE 01/25/2022 2200   LEUKOCYTESUR Negative 12/25/2014 1655   Sepsis Labs: Invalid input(s): PROCALCITONIN, [place]  Microbiology: Recent Results (from the past 240 hour(s))  Resp Panel by RT-PCR (Flu A&B, Covid) Anterior Nasal Swab     Status: None   Collection Time: 01/25/22  9:45 PM   Specimen: Anterior Nasal Swab  Result Value Ref Range Status   SARS Coronavirus 2 by RT PCR NEGATIVE NEGATIVE Final    Comment: (NOTE) SARS-CoV-2 target nucleic acids are NOT DETECTED.  The SARS-CoV-2 RNA is generally detectable in upper  respiratory specimens during the acute phase of infection. The lowest concentration of SARS-CoV-2 viral copies this assay can detect is 138 copies/mL. A negative result does not preclude SARS-Cov-2 infection and should not be used as the sole basis for treatment or other patient management decisions. A negative  result may occur with  improper specimen collection/handling, submission of specimen other than nasopharyngeal swab, presence of viral mutation(s) within the areas targeted by this assay, and inadequate number of viral copies(<138 copies/mL). A negative result must be combined with clinical observations, patient history, and epidemiological information. The expected result is Negative.  Fact Sheet for Patients:  EntrepreneurPulse.com.au  Fact Sheet for Healthcare Providers:  IncredibleEmployment.be  This test is no t yet approved or cleared by the [place] FDA and  has been authorized for detection and/or diagnosis of SARS-CoV-2 by FDA under an Emergency Use Authorization (EUA). This EUA will remain  in effect (meaning this test can be used) for the duration of the COVID-19 declaration under Section 564(b)(1) of the Act, 21 U.S.C.section 360bbb-3(b)(1), unless the authorization is terminated  or revoked sooner.       Influenza A by PCR NEGATIVE NEGATIVE Final   Influenza B by PCR NEGATIVE NEGATIVE Final    Comment: (NOTE) The Xpert Xpress SARS-CoV-2/FLU/RSV plus assay is intended as an aid in the diagnosis of influenza from Nasopharyngeal swab specimens and should not be used as a sole basis for treatment. Nasal washings and aspirates are unacceptable for Xpert Xpress SARS-CoV-2/FLU/RSV testing.  Fact Sheet for Patients: EntrepreneurPulse.com.au  Fact Sheet for Healthcare Providers: IncredibleEmployment.be  This test is not yet approved or cleared by the [place] FDA and has been  authorized for detection and/or diagnosis of SARS-CoV-2 by FDA under an Emergency Use Authorization (EUA). This EUA will remain in effect (meaning this test can be used) for the duration of the COVID-19 declaration under Section 564(b)(1) of the Act, 21 U.S.C. section 360bbb-3(b)(1), unless the authorization is terminated or revoked.  Performed at [facility], [address] [place], [place] [address]   Blood Culture (routine x 2)     Status: None (Preliminary result)   Collection Time: 01/25/22  9:55 PM   Specimen: BLOOD  Result Value Ref Range Status   Specimen Description BLOOD RIGHT FOREARM  Final   Special Requests   Final    BOTTLES DRAWN AEROBIC AND ANAEROBIC Blood Culture adequate volume   Culture   Final    NO GROWTH 2 DAYS Performed at [facility], [address] [place], [place] [address]    Report Status PENDING  Incomplete  Urine Culture     Status: Abnormal   Collection Time: 01/25/22 10:00 PM   Specimen: Urine, Random  Result Value Ref Range Status   Specimen Description   Final    URINE, RANDOM Performed at [facility], [address], [place], [place] [address]    Special Requests   Final    NONE Performed at [facility], [address] [place], [place] [address]    Culture (A)  Final    30,000 COLONIES/mL MULTIPLE SPECIES PRESENT, SUGGEST RECOLLECTION   Report Status 01/27/2022 FINAL  Final  Blood Culture (routine x 2)     Status: None (Preliminary result)   Collection Time: 01/25/22 10:15 PM   Specimen: BLOOD  Result Value Ref Range Status   Specimen Description BLOOD RIGHT ARM  Final   Special Requests   Final    BOTTLES DRAWN AEROBIC AND ANAEROBIC Blood Culture results may not be optimal due to an inadequate volume of blood received in culture bottles   Culture   Final    NO GROWTH 2 DAYS Performed at [facility], [address], [place],  [address]    Report Status  PENDING   Incomplete  Respiratory (~20 pathogens) panel by PCR     Status: None   Collection Time: 01/26/22  9:51 AM   Specimen: Nasopharyngeal Swab; Respiratory  Result Value Ref Range Status   Adenovirus NOT DETECTED NOT DETECTED Final   Coronavirus 229E NOT DETECTED NOT DETECTED Final    Comment: (NOTE) The Coronavirus on the Respiratory Panel, DOES NOT test for the novel  Coronavirus (2019 nCoV)    Coronavirus HKU1 NOT DETECTED NOT DETECTED Final   Coronavirus NL63 NOT DETECTED NOT DETECTED Final   Coronavirus OC43 NOT DETECTED NOT DETECTED Final   Metapneumovirus NOT DETECTED NOT DETECTED Final   Rhinovirus / Enterovirus NOT DETECTED NOT DETECTED Final   Influenza A NOT DETECTED NOT DETECTED Final   Influenza B NOT DETECTED NOT DETECTED Final   Parainfluenza Virus 1 NOT DETECTED NOT DETECTED Final   Parainfluenza Virus 2 NOT DETECTED NOT DETECTED Final   Parainfluenza Virus 3 NOT DETECTED NOT DETECTED Final   Parainfluenza Virus 4 NOT DETECTED NOT DETECTED Final   Respiratory Syncytial Virus NOT DETECTED NOT DETECTED Final   Bordetella pertussis NOT DETECTED NOT DETECTED Final   Bordetella Parapertussis NOT DETECTED NOT DETECTED Final   Chlamydophila pneumoniae NOT DETECTED NOT DETECTED Final   Mycoplasma pneumoniae NOT DETECTED NOT DETECTED Final    Comment: Performed at [facility], [place]. [address] [place], [place] [address]  MRSA Next Gen by PCR, Nasal     Status: None   Collection Time: 01/27/22 10:34 AM  Result Value Ref Range Status   MRSA by PCR Next Gen NOT DETECTED NOT DETECTED Final    Comment: (NOTE) The GeneXpert MRSA Assay (FDA approved for NASAL specimens only), is one component of a comprehensive MRSA colonization surveillance program. It is not intended to diagnose MRSA infection nor to guide or monitor treatment for MRSA infections. Test performance is not FDA approved in patients less than 46 years old. Performed at [facility], [address], [place], [place] [address]     Radiology Studies: No results found.    Elayne Gruver T. [place]  If 7PM-7AM, please contact night-coverage [email or web address] 01/27/2022, 4:27 PM

## 2022-01-27 NOTE — Assessment & Plan Note (Signed)
PT/OT eval

## 2022-01-27 NOTE — Progress Notes (Signed)
NAME:  Ryan [name], MRN:  [id number], DOB:  October 24, 1970, LOS: 1 ADMISSION DATE:  01/25/2022, CONSULTATION DATE:  01/25/2022 REFERRING MD:  Dr. 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  On presentation he was noted to be dyspneic with mild to moderate respiratory distress and hypoxic (O2 sats of 84%).  He also endorsed, nausea, vomiting, fever, chills, right lower extremity leg pain.  Of note patient was recently admitted to [facility] from 5//23 through 01/04/2022 due to complaints of shortness of breath, fever, chills.  He was admitted for severe sepsis and acute hypoxic respiratory failure due to suspected pneumonia, acute on chronic HFpEF, and bilateral nonocclusive DVT to bilateral lower extremities.  01/27/22- patient is markedly improved. He is now on room air and is breathing nonlabored saturating 100%.  He is on heparin but from pulmonary perspective is stable for dc planning. I recommend having another heme/onc evaluation since he states he did previously see heme/onc but did not get answer as to why he is having recurrent thrombosis despite full anticoagualation.    Pertinent  Medical History   Past Medical History:  Diagnosis Date   CHF (congestive heart failure) ([place])    Diabetes mellitus without complication ([place])    per pt-pre diabetic-Dr Rosario Jacks stated said pt   DVT (deep venous thrombosis) ([place])    Hypertension    Peripheral vascular disease ([place])    Presence of IVC filter      Micro Data:  5/29: SARS-CoV-2 and  influenza PCR>> negative 5/29: Blood culture x2>> 5/29: Urine>>  Antimicrobials:  Cefepime 5/29>> Vancomycin 5/29>>  Significant Hospital Events: Including procedures, antibiotic start and stop dates in addition to other pertinent events   5/29: Admitted by hospitalist.  Pulmonary consulted.  Interim History / Subjective:  -Patient admitted by hospitalist -PCCM asked to see in pulmonary consultation due to concern for severe pulmonary hypertension -Patient on 4 L nasal cannula, no acute distress  Objective   Blood pressure 134/80, pulse 78, temperature 98.3 F (36.8 C), resp. rate 16, height 5\' 7"  (1.702 m), weight (!) 167.6 kg, SpO2 95 %.        Intake/Output Summary (Last 24 hours) at 01/27/2022 [id number] Last data filed at 01/27/2022 0414 Gross per 24 hour  Intake 1684.99 ml  Output 2150 ml  Net -465.01 ml    Filed Weights   01/25/22 2136 01/26/22 1553  Weight: (!) 168 kg (!) 167.6 kg    Examination: General: Acute on chronically ill-appearing morbidly obese male, lying in bed, on nasal cannula, mild respiratory distress HENT: Atraumatic, no swelling, neck supple, difficult to assess JVD due to body habitus Lungs: Coarse breath sounds bilaterally, even, mild tachypnea Cardiovascular: Tachycardic, regular rhythm, S1-S2, no murmurs, rubs, gallops Abdomen: Obese, soft, nontender, no guarding or rebound tenderness, bowel sounds positive x4 Extremities: Bilateral lower extremity edema Neuro: Awake and alert, oriented to person, place, time, moves extremities to commands, no focal deficits, speech clear GU: Deferred      Assessment & Plan:   Acute  hypoxic respiratory failure in the setting of known pulmonary embolism, suspected pulmonary hypertension, & questionable pneumonia -Supplemental O2 as needed to maintain O2 sats >90% -BiPAP if needed -Follow intermittent Chest X-ray & ABG as needed -Bronchodilators Prn -Diuresis as BP and renal function permits -Incentive  spirometry  Known pulmonary emboli and bilateral DVTs status post IVC filter placement Chronic HFpEF without acute exacerbation Hypertension -Continuous cardiac monitoring -Maintain MAP >65 -Trend lactic acid until normalized -Trend HS Troponin until peaked -Echocardiogram pending -Transition to heparin drip while in hospital (on Xarelto at home) -Consider hematology consult -Continue metoprolol -As needed hydralazine  Meet SIRS criteria on admission No obvious source of infection at this time -Monitor fever curve -Trend WBC's, procalcitonin negative x1 -Follow cultures as above -Initial Chest x-ray without obvious pneumonia, urinalysis negative for UTI -Continue empiri cefepime and vancomycin for now pending cultures and sensitivities  Chronic kidney disease stage III -Monitor I&O's / urinary output -Follow BMP -Ensure adequate renal perfusion -Avoid nephrotoxic agents as able -Replace electrolytes as indicated      Best Practice (right click and "Reselect all SmartList Selections" daily)   Diet/type: Regular consistency (see orders) DVT prophylaxis: systemic heparin GI prophylaxis: PPI Lines: N/A Foley:  N/A Code Status:  full code Last date of multidisciplinary goals of care discussion [N/A]  Labs   CBC: Recent Labs  Lab 01/25/22 2145 01/27/22 0515  WBC 8.4 5.4  NEUTROABS 6.2  --   HGB 14.4 14.2  HCT 45.6 45.6  MCV 94.0 94.8  PLT 204 166     Basic Metabolic Panel: Recent Labs  Lab 01/25/22 2145 01/26/22 0620 01/27/22 0515  NA 138 136 134*  K 4.1 4.2 4.0  CL 104 103 104  CO2 24 23 19*  GLUCOSE 116* 118* 111*  BUN 12 12 13   CREATININE 1.30* 1.60* 1.39*  CALCIUM 8.4* 8.6* 8.2*  MG  --  1.7 2.2  PHOS  --   --  3.1    GFR: Estimated Creatinine Clearance: 93.8 mL/min (A) (by C-G formula based on SCr of 1.39 mg/dL (H)). Recent Labs  Lab 01/25/22 2145 01/26/22 0045 01/26/22 0620 01/27/22 0515  PROCALCITON 0.10  --   --   --   WBC 8.4   --   --  5.4  LATICACIDVEN 2.2* 2.1* 1.4  --      Liver Function Tests: Recent Labs  Lab 01/25/22 2145  AST 22  ALT 25  ALKPHOS 58  BILITOT 0.8  PROT 8.0  ALBUMIN 3.8    No results for input(s): LIPASE, AMYLASE in the last 168 hours. No results for input(s): AMMONIA in the last 168 hours.  ABG No results found for: PHART, PCO2ART, PO2ART, HCO3, TCO2, ACIDBASEDEF, O2SAT   Coagulation Profile: Recent Labs  Lab 01/25/22 2145 01/26/22 0141  INR 1.4* 1.5*     Cardiac Enzymes: No results for input(s): CKTOTAL, CKMB, CKMBINDEX, TROPONINI in the last 168 hours.  HbA1C: Hemoglobin A1C  Date/Time Value Ref Range Status  12/26/2014 05:20 AM 6.1 (H) % Final    Comment:    4.0-6.0 NOTE: New Reference Range  11/05/14   12/25/2014 03:05 AM 5.9 % Final    Comment:    4.0-6.0 NOTE: New Reference Range  11/05/14    Hgb A1c MFr Bld  Date/Time Value Ref Range Status  10/29/2021 09:14 AM 6.5 (H) 4.8 - 5.6 % Final    Comment:             Prediabetes: 5.7 - 6.4  Diabetes: >6.4          Glycemic control for adults with diabetes: <7.0   07/29/2021 02:11 PM 6.3 (H) 4.8 - 5.6 % Final    Comment:             Prediabetes: 5.7 - 6.4          Diabetes: >6.4          Glycemic control for adults with diabetes: <7.0     CBG: Recent Labs  Lab 01/26/22 1216 01/26/22 1623  GLUCAP 99 130*    Review of Systems:   Positives in BOLD: Gen: Denies fever, chills, weight change, fatigue, night sweats HEENT: Denies blurred vision, double vision, hearing loss, tinnitus, sinus congestion, rhinorrhea, sore throat, neck stiffness, dysphagia PULM: Denies shortness of breath, cough, sputum production, hemoptysis, wheezing CV: Denies chest pain, edema, orthopnea, paroxysmal nocturnal dyspnea, palpitations GI: Denies abdominal pain, nausea, vomiting, diarrhea, hematochezia, melena, constipation, change in bowel habits GU: Denies dysuria, hematuria, polyuria, oliguria, urethral  discharge Endocrine: Denies hot or cold intolerance, polyuria, polyphagia or appetite change Derm: Denies rash, dry skin, scaling or peeling skin change Heme: Denies easy bruising, bleeding, bleeding gums Neuro: Denies headache, numbness, weakness, slurred speech, loss of memory or consciousness   Past Medical History:  He,  has a past medical history of CHF (congestive heart failure) (HCC), Diabetes mellitus without complication (HCC), DVT (deep venous thrombosis) (HCC), Hypertension, Peripheral vascular disease (HCC), and Presence of IVC filter.   Surgical History:   Past Surgical History:  Procedure Laterality Date   CARDIAC ELECTROPHYSIOLOGY STUDY AND ABLATION  10/2020   PERIPHERAL VASCULAR CATHETERIZATION N/A 01/10/2015   Procedure: Dialysis/Perma Catheter Insertion;  Surgeon: Renford Dills, MD;  Location: [facility] INVASIVE CV LAB;  Service: Cardiovascular;  Laterality: N/A;   PERIPHERAL VASCULAR CATHETERIZATION N/A 02/24/2015   Procedure: Dialysis/Perma Catheter Removal;  Surgeon: Annice Needy, MD;  Location: [facility] INVASIVE CV LAB;  Service: Cardiovascular;  Laterality: N/A;     Social History:   reports that he has never smoked. He has never used smokeless tobacco. He reports that he does not drink alcohol and does not use drugs.   Family History:  His family history includes Breast cancer in his mother; Cancer in his mother; Diabetes in his father; Hypertension in his father and mother; Prostate cancer in his father; Pulmonary embolism in his paternal grandfather and paternal uncle.   Allergies Allergies  Allergen Reactions   Iodinated Contrast Media Other (See Comments)    Kidney failure requiring dialysis in 2013. He has had cta for PE since 2013.     Metrizamide Other (See Comments)    Kidney failure requiring dialysis Kidney failure requiring dialysis    Clindamycin/Lincomycin Rash   Lincomycin Rash   Sulfa Antibiotics Rash     Home Medications  Prior to Admission  medications   Medication Sig Start Date End Date Taking? Authorizing Provider  amLODipine (NORVASC) 10 MG tablet Take 10 mg by mouth daily. 12/04/21  Yes [provider]  empagliflozin (JARDIANCE) 25 MG TABS tablet Take 1 tablet (25 mg total) by mouth daily before breakfast. 12/09/21  Yes Johnson, Megan P, DO  losartan (COZAAR) 50 MG tablet Take 50 mg by mouth daily. 03/11/20  Yes [provider]  metoprolol succinate (TOPROL-XL) 25 MG 24 hr tablet Take 1 tablet by mouth daily.   Yes [provider]  XARELTO 20 MG TABS tablet Take 20 mg by mouth daily. 12/07/21  Yes [provider]  magnesium oxide (MAG-OX) 400 MG tablet Take 1 tablet by mouth in the morning and at bedtime. Patient not taking: Reported on 01/25/2022 10/23/21 10/23/22  [provider]  spironolactone (ALDACTONE) 50 MG tablet Take 0.5 tablets (25 mg total) by mouth daily. Patient not taking: Reported on 01/25/2022 01/04/22   Annita Brod, MD       Ottie Glazier, M.D.  Pulmonary & Critical Care Medicine

## 2022-01-27 NOTE — Evaluation (Signed)
Physical Therapy Evaluation Patient Details Name: Ryan [name] MRN: [id number] DOB: [birth year] Today's Date: 01/27/2022  History of Present Illness  Per HPI on 01/25/22: Ryan [name] is a 51 y.o. male with a history of CHF, diabetes, hypertension, IVC filter due to history of PEs who presents with complaints of fever, shortness of breath.  Patient reports earlier today he "started feeling terrible ".  He reports he is concerned that his pneumonia has returned.  Review of medical records demonstrates the patient was discharged from the hospital on Dec 31, 2021 after similar presentation.  At that time he presented with fever tachycardia and symptoms consistent with sepsis but x-ray and CT scan were negative for pneumonia.   Clinical Impression  Pt admitted with above diagnosis. Pt received upright in bed resting on 2L/min. Reports improvement in his breathing, stating at baseline pt is indep with ADL's/IADL's and ambulation in household and community. Reports he has been up ambulating in room since admission. Pt resting at low 90's via SPO2 on 2 L/min. Pt mod-I with bed mobility with features and independent in donning slip on shoes (Crocs). Pt able to stand independently and perform ~120' of ambulation with supervision pushing IV pole. Step through gait appreciated with initial minor buckling of LE's with beginning steps but pt self recovers and gait normalizes there after. Titration attempted on RA with initial SPO2 at 92% on RA but by end of 120' pt began increased WOB with SOB and SPO2 at 87%. Pt requiring seated rest and PLB. Pt recovers to >90% on 3 L/min within 2 min. Pt close to baseline mobility with mild endurance deficits due to SOB but pt states it is improving. As of now, PT does not recommend f/u recs at d/c. 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 Energy conservation techniques given to pt (I.e.  strategic placement of chairs post ambulation and stair navigation in home environment and PLB).  Pt left in recliner with all needs in reach. Pt currently with functional limitations due to the deficits listed below (see PT Problem List). Pt will benefit from skilled PT to increase their independence and safety with mobility to allow discharge to the venue listed below.      Recommendations for follow up therapy are one component of a multi-disciplinary discharge planning process, led by the attending physician.  Recommendations may be updated based on patient status, additional functional criteria and insurance authorization.  Follow Up Recommendations No PT follow up    Assistance Recommended at Discharge PRN  Patient can return home with the following  Help with stairs or ramp for entrance    Equipment Recommendations None recommended by PT  Recommendations for Other Services       Functional Status Assessment Patient has had a recent decline in their functional status and demonstrates the ability to make significant improvements in function in a reasonable and predictable amount of time.     Precautions / Restrictions Precautions Precautions: Fall Restrictions Weight Bearing Restrictions: No      Mobility  Bed Mobility Overal bed mobility: Modified Independent             General bed mobility comments: use of bed features Patient Response: Cooperative  Transfers Overall transfer level: Independent   Transfers: Sit to/from Stand Sit to Stand: Independent                Ambulation/Gait Ambulation/Gait  assistance: Supervision Gait Distance (Feet): 120 Feet Assistive device: IV Pole Gait Pattern/deviations: Step-through pattern, Decreased step length - right, Decreased step length - left       General Gait Details: Consistent step through gait. Minor initial buckling of knees with first few steps but pt self recovers and gait normalizes after  Stairs             Wheelchair Mobility    Modified Rankin (Stroke Patients Only)       Balance Overall balance assessment: Needs assistance Sitting-balance support: Bilateral upper extremity supported, Feet supported Sitting balance-Leahy Scale: Fair     Standing balance support: No upper extremity supported Standing balance-Leahy Scale: Fair                               Pertinent Vitals/Pain Pain Assessment Pain Assessment: No/denies pain    Home Living Family/patient expects to be discharged to:: Private residence Living Arrangements: Children Available Help at Discharge: Family;Available PRN/intermittently Type of Home: House Home Access: Level entry     Alternate Level Stairs-Number of Steps: Bed/bathroom on main level; 12 steps to office (pt works from home) Home Layout: Two level Home Equipment: Agricultural consultant (2 wheels);Cane - single point Additional Comments: has not needed AD    Prior Function Prior Level of Function : Independent/Modified Independent                     Hand Dominance        Extremity/Trunk Assessment   Upper Extremity Assessment Upper Extremity Assessment: Overall WFL for tasks assessed    Lower Extremity Assessment Lower Extremity Assessment: Generalized weakness    Cervical / Trunk Assessment Cervical / Trunk Assessment: Normal  Communication   Communication: No difficulties  Cognition Arousal/Alertness: Awake/alert Behavior During Therapy: WFL for tasks assessed/performed Overall Cognitive Status: Within Functional Limits for tasks assessed                                          General Comments General comments (skin integrity, edema, etc.): SPO2 on 2L/min at 90-93%. Ambulation on RA down to 87%. on 3L/min, recovers to 94% with seated rest and PLB within 2 minutes.    Exercises Other Exercises Other Exercises: Role of PT in acute setting, d/c recs, energy conservation techniques.    Assessment/Plan    PT Assessment Patient needs continued PT services  PT Problem List Decreased strength;Decreased activity tolerance       PT Treatment Interventions DME instruction;Therapeutic exercise;Gait training;Balance training;Stair training;Neuromuscular re-education;Functional mobility training;Therapeutic activities;Patient/family education    PT Goals (Current goals can be found in the Care Plan section)  Acute Rehab PT Goals Patient Stated Goal: to go home PT Goal Formulation: With patient Time For Goal Achievement: 02/10/22 Potential to Achieve Goals: Good    Frequency Min 2X/week     Co-evaluation               AM-PAC PT "6 Clicks" Mobility  Outcome Measure Help needed turning from your back to your side while in a flat bed without using bedrails?: A Little Help needed moving from lying on your back to sitting on the side of a flat bed without using bedrails?: A Little Help needed moving to and from a bed to a chair (including a wheelchair)?: None Help needed standing up  from a chair using your arms (e.g., wheelchair or bedside chair)?: None Help needed to walk in hospital room?: A Little Help needed climbing 3-5 steps with a railing? : A Little 6 Click Score: 20    End of Session Equipment Utilized During Treatment: Oxygen Activity Tolerance: Patient tolerated treatment well Patient left: in chair;with call bell/phone within reach Nurse Communication: Mobility status PT Visit Diagnosis: Muscle weakness (generalized) (M62.81)    Time: 1018-1040 PT Time Calculation (min) (ACUTE ONLY): 22 min   Charges:   PT Evaluation $PT Eval Moderate Complexity: 1 Mod PT Treatments $Self Care/Home Management: 8-22        Delphia Grates. Fairly IV, PT, DPT Physical Therapist- [place]  [facility]  01/27/2022, 11:04 AM

## 2022-01-27 NOTE — Progress Notes (Signed)
PT Supplemental Oxygen Note  Patient Details Name: DADE RODIN MRN: [id number] DOB: [birth year]  SaO2 on room air at rest = 91% SaO2 on room air while ambulating = 87% SaO2 on 2 liters of O2 while ambulating = 90-92%    Delphia Grates. Fairly IV, PT, DPT Physical Therapist- [place]  [facility]  01/27/2022, 11:12 AM

## 2022-01-27 NOTE — Progress Notes (Signed)
ANTICOAGULATION CONSULT NOTE  Pharmacy Consult for heparin infusion Indication: pulmonary embolus  Allergies  Allergen Reactions   Iodinated Contrast Media Other (See Comments)    Kidney failure requiring dialysis in 2013. He has had cta for PE since 2013.     Metrizamide Other (See Comments)    Kidney failure requiring dialysis Kidney failure requiring dialysis    Clindamycin/Lincomycin Rash   Lincomycin Rash   Sulfa Antibiotics Rash    Patient Measurements: Height: 5\' 7"  (170.2 cm) Weight: (!) 167.6 kg (369 lb 6.4 oz) IBW/kg (Calculated) : 66.1 Heparin Dosing Weight: 108.2 kg  Vital Signs: Temp: 98.3 F (36.8 C) (05/31 0343) BP: 134/80 (05/31 0343) Pulse Rate: 78 (05/31 0343)  Labs: Recent Labs    01/25/22 2145 01/26/22 0141 01/26/22 0620 01/26/22 1400 01/26/22 1947 01/27/22 0515 01/27/22 0604  HGB 14.4  --   --   --   --  14.2  --   HCT 45.6  --   --   --   --  45.6  --   PLT 204  --   --   --   --  166  --   APTT 27 83* 45* 66* 58*  --  70*  LABPROT 16.6* 17.8*  --   --   --   --   --   INR 1.4* 1.5*  --   --   --   --   --   HEPARINUNFRC  --  >1.10* >1.10*  --   --   --  0.56  CREATININE 1.30*  --  1.60*  --   --  1.39*  --   TROPONINIHS 34* 41*  --   --   --   --   --      Estimated Creatinine Clearance: 93.8 mL/min (A) (by C-G formula based on SCr of 1.39 mg/dL (H)).   Medical History: Past Medical History:  Diagnosis Date   CHF (congestive heart failure) (HCC)    Diabetes mellitus without complication (HCC)    per pt-pre diabetic-Dr 01/29/22 stated said pt   DVT (deep venous thrombosis) (HCC)    Hypertension    Peripheral vascular disease (HCC)    Presence of IVC filter   Heparin Dosing Weight: 108.2 kg  Medications:  PTA: Xarelto 20 mg q24h, last dose: 5/28 (PM)  Assessment: Pt is 51 yo male presenting to ED c/o SOB, N/V, fever, chills, & R lower leg pain with h/o chronic PE, believed to be failing on Xarelto per admitting  MD.  Date Time aPTT/HL Rate/Comment 530 0141 83s / >1.1 Thera x1; 1750 un/hr 5/30 0620 45s / >1.1 Subthera; 1750 > 2100 un/hr 5/30 1400 66s / --  therapeutic x1; 2100 un/hr    5/30 1947 58s / --  Subtherapeutic; 2100 > 2300 un/hr 5/31 0604 70s / 0.56 Therapeutic x 1  Baseline Labs: aPTT - 27s INR - 1.4 Hgb - 14.4 Plts - 204  Goal of Therapy:  Heparin level 0.3-0.7 units/ml aPTT 66-108 seconds Monitor platelets by anticoagulation protocol: Yes   Plan:  aPTT/HL are subtherapeutic; HL starting to correlate w/ aPTT. Continue heparin infusion rate at 2300 units/hr Check aPTT/HL in 6 hours to confirm therapeutic level and correlation, then daily by HL alone Continue to monitor H&H and platelets daily while on heparin gtt.  6/31, PharmD, [facility] 01/27/2022 6:51 AM

## 2022-01-27 NOTE — Progress Notes (Signed)
ANTICOAGULATION CONSULT NOTE  Pharmacy Consult for heparin infusion Indication: pulmonary embolus  Allergies  Allergen Reactions   Iodinated Contrast Media Other (See Comments)    Kidney failure requiring dialysis in 2013. He has had cta for PE since 2013.     Metrizamide Other (See Comments)    Kidney failure requiring dialysis Kidney failure requiring dialysis    Clindamycin/Lincomycin Rash   Lincomycin Rash   Sulfa Antibiotics Rash    Patient Measurements: Height: 5\' 7"  (170.2 cm) Weight: (!) 167.6 kg (369 lb 6.4 oz) IBW/kg (Calculated) : 66.1 Heparin Dosing Weight: 108.2 kg  Vital Signs: Temp: 98.1 F (36.7 C) (05/31 1124) Temp Source: Oral (05/31 1124) BP: 135/92 (05/31 1124) Pulse Rate: 92 (05/31 1124)  Labs: Recent Labs    01/25/22 2145 01/25/22 2145 01/26/22 0141 01/26/22 0620 01/26/22 1400 01/26/22 1947 01/27/22 0515 01/27/22 0604 01/27/22 1228  HGB 14.4  --   --   --   --   --  14.2  --   --   HCT 45.6  --   --   --   --   --  45.6  --   --   PLT 204  --   --   --   --   --  166  --   --   APTT 27  --  83* 45*   < > 58*  --  70* 47*  LABPROT 16.6*  --  17.8*  --   --   --   --   --   --   INR 1.4*  --  1.5*  --   --   --   --   --   --   HEPARINUNFRC  --    < > >1.10* >1.10*  --   --   --  0.56 0.37  CREATININE 1.30*  --   --  1.60*  --   --  1.39*  --   --   TROPONINIHS 34*  --  41*  --   --   --   --   --   --    < > = values in this interval not displayed.     Estimated Creatinine Clearance: 93.8 mL/min (A) (by C-G formula based on SCr of 1.39 mg/dL (H)).   Medical History: Past Medical History:  Diagnosis Date   CHF (congestive heart failure) (HCC)    Diabetes mellitus without complication (HCC)    per pt-pre diabetic-Dr 01/29/22 stated said pt   DVT (deep venous thrombosis) (HCC)    Hypertension    Peripheral vascular disease (HCC)    Presence of IVC filter   Heparin Dosing Weight: 108.2 kg  Medications:  PTA: Xarelto 20 mg q24h,  last dose: 5/28 (PM)  Assessment: Pt is 51 yo male presenting to ED c/o SOB, N/V, fever, chills, & R lower leg pain with h/o chronic PE, believed to be failing on Xarelto per admitting MD.  Date Time aPTT/HL Rate/Comment 530 0141 83s / >1.1 Thera x1; 1750 un/hr 5/30 0620 45s / >1.1 Subthera; 1750 > 2100 un/hr 5/30 1400 66s / --  therapeutic x1; 2100 un/hr    5/30 1947 58s / --  Subtherapeutic; 2100 > 2300 un/hr 5/31 0604 70s / 0.56 Therapeutic x 1 5/31 1228 47/0.37 No correlating and aPTT is subtherapeutic.    Goal of Therapy:  Heparin level 0.3-0.7 units/ml aPTT 66-108 seconds Monitor platelets by anticoagulation protocol: Yes   Plan:  aPTT are subtherapeutic,  with some still false elevation in heparin level. Will give heparin bolus of 1600 units and increase the heparin infusion to 2500 units/hr. Recheck heparin level and aPTT in 6 hours. CBC daily while on heparin.  Switch to heparin level monitoring once aPTT and heparin level correlating.   Paschal Dopp, PharmD 01/27/2022 1:24 PM

## 2022-01-27 NOTE — Assessment & Plan Note (Signed)
Encourage outpatient sleep study

## 2022-01-28 DIAGNOSIS — A419 Sepsis, unspecified organism: Secondary | ICD-10-CM | POA: Diagnosis not present

## 2022-01-28 DIAGNOSIS — J9601 Acute respiratory failure with hypoxia: Secondary | ICD-10-CM | POA: Diagnosis not present

## 2022-01-28 DIAGNOSIS — Z86711 Personal history of pulmonary embolism: Secondary | ICD-10-CM | POA: Diagnosis not present

## 2022-01-28 DIAGNOSIS — I2782 Chronic pulmonary embolism: Secondary | ICD-10-CM

## 2022-01-28 LAB — HEMOGLOBIN A1C
Hgb A1c MFr Bld: 6.2 % — ABNORMAL HIGH (ref 4.8–5.6)
Mean Plasma Glucose: 131.24 mg/dL

## 2022-01-28 LAB — RENAL FUNCTION PANEL
Albumin: 3.3 g/dL — ABNORMAL LOW (ref 3.5–5.0)
Anion gap: 6 (ref 5–15)
BUN: 17 mg/dL (ref 6–20)
CO2: 23 mmol/L (ref 22–32)
Calcium: 8.3 mg/dL — ABNORMAL LOW (ref 8.9–10.3)
Chloride: 107 mmol/L (ref 98–111)
Creatinine, Ser: 1.36 mg/dL — ABNORMAL HIGH (ref 0.61–1.24)
GFR, Estimated: >60 mL/min (ref >60)
Glucose, Bld: 122 mg/dL — ABNORMAL HIGH (ref 70–99)
Phosphorus: 3.5 mg/dL (ref 2.5–4.6)
Potassium: 3.8 mmol/L (ref 3.5–5.1)
Sodium: 136 mmol/L (ref 135–145)

## 2022-01-28 LAB — CBC
HCT: 43.3 % (ref 39.0–52.0)
Hemoglobin: 13.8 g/dL (ref 13.0–17.0)
MCH: 29.6 pg (ref 26.0–34.0)
MCHC: 31.9 g/dL (ref 30.0–36.0)
MCV: 92.7 fL (ref 80.0–100.0)
Platelets: 180 10*3/uL (ref 150–400)
RBC: 4.67 MIL/uL (ref 4.22–5.81)
RDW: 14.9 % (ref 11.5–15.5)
WBC: 4.8 10*3/uL (ref 4.0–10.5)
nRBC: 0 % (ref 0.0–0.2)

## 2022-01-28 LAB — GLUCOSE, CAPILLARY
Glucose-Capillary: 119 mg/dL — ABNORMAL HIGH (ref 70–99)
Glucose-Capillary: 110 mg/dL — ABNORMAL HIGH (ref 70–99)

## 2022-01-28 LAB — PROCALCITONIN: Procalcitonin: 0.61 ng/mL

## 2022-01-28 LAB — MAGNESIUM: Magnesium: 2.2 mg/dL (ref 1.7–2.4)

## 2022-01-28 LAB — HEPARIN LEVEL (UNFRACTIONATED): Heparin Unfractionated: 0.49 IU/mL (ref 0.30–0.70)

## 2022-01-28 LAB — BRAIN NATRIURETIC PEPTIDE: B Natriuretic Peptide: 14.6 pg/mL (ref 0.0–100.0)

## 2022-01-28 MED ORDER — AZITHROMYCIN 500 MG PO TABS: 500.0000 mg | ORAL_TABLET | Freq: Every day | ORAL | 0 refills | Status: AC

## 2022-01-28 MED ORDER — LOSARTAN POTASSIUM 50 MG PO TABS: 50.0000 mg | ORAL_TABLET | Freq: Every day | ORAL | Status: DC

## 2022-01-28 MED ORDER — AMOXICILLIN-POT CLAVULANATE ER 1000-62.5 MG PO TB12: 2.0000 | ORAL_TABLET | Freq: Two times a day (BID) | ORAL | 0 refills | Status: DC

## 2022-01-28 MED ORDER — FUROSEMIDE 40 MG PO TABS: 40.0000 mg | ORAL_TABLET | Freq: Every day | ORAL | 0 refills | Status: DC

## 2022-01-28 NOTE — Progress Notes (Signed)
Nsg Discharge Note  Admit Date:  01/25/2022 Discharge date: 01/28/2022   [name] to be D/C'd Home per MD order.  AVS completed.  Patient/caregiver able to verbalize understanding.  Discharge Medication: Allergies as of 01/28/2022       Reactions   Iodinated Contrast Media Other (See Comments)   Kidney failure requiring dialysis in 2013. He has had cta for PE since 2013.    Metrizamide Other (See Comments)   Kidney failure requiring dialysis Kidney failure requiring dialysis   Clindamycin/lincomycin Rash   Lincomycin Rash   Sulfa Antibiotics Rash        Medication List     STOP taking these medications    amLODipine 10 MG tablet Commonly known as: NORVASC   magnesium oxide 400 MG tablet Commonly known as: MAG-OX   spironolactone 50 MG tablet Commonly known as: ALDACTONE       TAKE these medications    amoxicillin-clavulanate 1000-62.5 MG 12 hr tablet Commonly known as: AUGMENTIN XR Take 2 tablets by mouth 2 (two) times daily for 4 days.   azithromycin 500 MG tablet Commonly known as: Zithromax Take 1 tablet (500 mg total) by mouth daily for 3 days. Take 1 tablet daily for 3 days.   empagliflozin 25 MG Tabs tablet Commonly known as: Jardiance Take 1 tablet (25 mg total) by mouth daily before breakfast.   furosemide 40 MG tablet Commonly known as: Lasix Take 1 tablet (40 mg total) by mouth daily.   losartan 50 MG tablet Commonly known as: COZAAR Take 1 tablet (50 mg total) by mouth daily. Start taking on: February 01, 2022 What changed: These instructions start on February 01, 2022. If you are unsure what to do until then, ask your doctor or other care provider.   metoprolol succinate 25 MG 24 hr tablet Commonly known as: TOPROL-XL Take 1 tablet by mouth daily.   Xarelto 20 MG Tabs tablet Generic drug: rivaroxaban Take 20 mg by mouth daily.               Durable Medical Equipment  (From admission, onward)           Start     Ordered    01/28/22 1110  For home use only DME oxygen  Once       Question Answer Comment  Length of Need 6 Months   Mode or (Route) Nasal cannula   Liters per Minute 2   Frequency Continuous (stationary and portable oxygen unit needed)   Oxygen delivery system Gas      01/28/22 1109            Discharge Assessment: Vitals:   01/28/22 0401 01/28/22 1114  BP: 138/88 135/86  Pulse: 76 91  Resp: 20 18  Temp: 98.8 F (37.1 C) 97.9 F (36.6 C)  SpO2: 93% 95%   Skin clean, dry and intact without evidence of skin break down, no evidence of skin tears noted. IV catheter discontinued intact. Site without signs and symptoms of complications - no redness or edema noted at insertion site, patient denies c/o pain - only slight tenderness at site.  Dressing with slight pressure applied.  D/c Instructions-Education: Discharge instructions given to patient/family with verbalized understanding. D/c education completed with patient/family including follow up instructions, medication list, d/c activities limitations if indicated, with other d/c instructions as indicated by MD - patient able to verbalize understanding, all questions fully answered. Patient instructed to return to ED, call 911, or call MD for any  changes in condition.  Patient escorted via [place], and D/C home via private auto.  Dominyck Reser, Jolene Schimke, RN 01/28/2022 2:58 PM

## 2022-01-28 NOTE — Progress Notes (Signed)
Physical Therapy Treatment Patient Details Name: Ryan [name] MRN: [id number] DOB: [birth year] Today's Date: 01/28/2022   History of Present Illness Per HPI on 01/25/22: Ryan [name] is a 51 y.o. male with a history of CHF, diabetes, hypertension, IVC filter due to history of PEs who presents with complaints of fever, shortness of breath.  Patient reports earlier today he "started feeling terrible ".  He reports he is concerned that his pneumonia has returned.  Review of medical records demonstrates the patient was discharged from the hospital on Dec 31, 2021 after similar presentation.  At that time he presented with fever tachycardia and symptoms consistent with sepsis but x-ray and CT scan were negative for pneumonia.    PT Comments    Pt received independently standing in bathroom. Agreeable to PT services. Pt on RA maintaining SPO2 >/= 90%. Pt able to ambulate ~500' and perform 9 stairs on RA independently with IV pole. Minor desaturation to 87-88% on RA but able to quickly recover with standing rest and PLB. Pt returned to seated EOB with education on gradual return to exercise with walking program and LE therex. Reiterated energy conservation techniques  with stairs and long distances ambulation and importance of PLB for improving SOB. Pt verbalizing understanding. PT to sign off as pt appears close to baseline function.    Recommendations for follow up therapy are one component of a multi-disciplinary discharge planning process, led by the attending physician.  Recommendations may be updated based on patient status, additional functional criteria and insurance authorization.  Follow Up Recommendations  No PT follow up     Assistance Recommended at Discharge PRN  Patient can return home with the following     Equipment Recommendations  None recommended by PT    Recommendations for Other Services       Precautions / Restrictions Precautions Precautions: Fall Restrictions Weight  Bearing Restrictions: No     Mobility  Bed Mobility               General bed mobility comments: NT. OOB in rest room upon arrival Patient Response: Cooperative  Transfers Overall transfer level: Independent   Transfers: Sit to/from Stand Sit to Stand: Independent                Ambulation/Gait Ambulation/Gait assistance: Supervision Gait Distance (Feet): 500 Feet Assistive device: IV Pole Gait Pattern/deviations: Step-through pattern, Decreased step length - right, Decreased step length - left           Stairs Stairs: Yes Stairs assistance: Independent Stair Management: No rails, Alternating pattern Number of Stairs: 9 General stair comments: safe completion step through pattern   Wheelchair Mobility    Modified Rankin (Stroke Patients Only)       Balance Overall balance assessment: Needs assistance Sitting-balance support: Bilateral upper extremity supported, Feet supported Sitting balance-Leahy Scale: Good     Standing balance support: No upper extremity supported Standing balance-Leahy Scale: Good                              Cognition Arousal/Alertness: Awake/alert Behavior During Therapy: WFL for tasks assessed/performed Overall Cognitive Status: Within Functional Limits for tasks assessed                                          Exercises Other Exercises Other Exercises: monitoring  SPo2 at home with gradual return to exercise. Energy conservation techniques, walking program, LE therex    General Comments General comments (skin integrity, edema, etc.): SPo2 down to 89% on RA. Able to recover > 90% with bried standing rest breaks and PLB      Pertinent Vitals/Pain Pain Assessment Pain Assessment: No/denies pain    Home Living                          Prior Function            PT Goals (current goals can now be found in the care plan section) Acute Rehab PT Goals Patient Stated Goal:  to go home PT Goal Formulation: With patient Time For Goal Achievement: 02/10/22 Potential to Achieve Goals: Good Progress towards PT goals: Progressing toward goals    Frequency    Min 2X/week      PT Plan Current plan remains appropriate    Co-evaluation              AM-PAC PT "6 Clicks" Mobility   Outcome Measure  Help needed turning from your back to your side while in a flat bed without using bedrails?: A Little Help needed moving from lying on your back to sitting on the side of a flat bed without using bedrails?: A Little Help needed moving to and from a bed to a chair (including a wheelchair)?: None Help needed standing up from a chair using your arms (e.g., wheelchair or bedside chair)?: None Help needed to walk in hospital room?: None Help needed climbing 3-5 steps with a railing? : None 6 Click Score: 22    End of Session Equipment Utilized During Treatment: Oxygen Activity Tolerance: Patient tolerated treatment well Patient left: in bed Nurse Communication: Mobility status PT Visit Diagnosis: Muscle weakness (generalized) (M62.81)     Time: 1610-9604 PT Time Calculation (min) (ACUTE ONLY): 17 min  Charges:  $Therapeutic Exercise: 8-22 mins                    Delphia Grates. Fairly IV, PT, DPT Physical Therapist- [facility]  01/28/2022, 12:28 PM

## 2022-01-28 NOTE — Plan of Care (Signed)
  Problem: Health Behavior/Discharge Planning: Goal: Ability to manage health-related needs will improve Outcome: Adequate for Discharge  To go home today

## 2022-01-28 NOTE — Progress Notes (Signed)
ANTICOAGULATION CONSULT NOTE  Pharmacy Consult for heparin infusion Indication: pulmonary embolus  Allergies  Allergen Reactions   Iodinated Contrast Media Other (See Comments)    Kidney failure requiring dialysis in 2013. He has had cta for PE since 2013.     Metrizamide Other (See Comments)    Kidney failure requiring dialysis Kidney failure requiring dialysis    Clindamycin/Lincomycin Rash   Lincomycin Rash   Sulfa Antibiotics Rash    Patient Measurements: Height: 5\' 7"  (170.2 cm) Weight: (!) 167.6 kg (369 lb 6.4 oz) IBW/kg (Calculated) : 66.1 Heparin Dosing Weight: 108.2 kg  Vital Signs: Temp: 98.3 F (36.8 C) (05/31 1939) Temp Source: Oral (05/31 1939) BP: 124/83 (05/31 1939) Pulse Rate: 81 (05/31 1939)  Labs: Recent Labs    01/25/22 2145 01/25/22 2145 01/26/22 0141 01/26/22 0620 01/26/22 1400 01/27/22 0515 01/27/22 0604 01/27/22 1228 01/27/22 2012 01/28/22 0221  HGB 14.4  --   --   --   --  14.2  --   --   --  13.8  HCT 45.6  --   --   --   --  45.6  --   --   --  43.3  PLT 204  --   --   --   --  166  --   --   --  180  APTT 27  --  83* 45*   < >  --  70* 47* 66*  --   LABPROT 16.6*  --  17.8*  --   --   --   --   --   --   --   INR 1.4*  --  1.5*  --   --   --   --   --   --   --   HEPARINUNFRC  --    < > >1.10* >1.10*  --   --  0.56 0.37 0.51 0.49  CREATININE 1.30*  --   --  1.60*  --  1.39*  --   --   --  1.36*  TROPONINIHS 34*  --  41*  --   --   --   --   --   --   --    < > = values in this interval not displayed.     Estimated Creatinine Clearance: 95.9 mL/min (A) (by C-G formula based on SCr of 1.36 mg/dL (H)).   Medical History: Past Medical History:  Diagnosis Date   CHF (congestive heart failure) (HCC)    Diabetes mellitus without complication (HCC)    per pt-pre diabetic-Dr 03/30/22 stated said pt   DVT (deep venous thrombosis) (HCC)    Hypertension    Peripheral vascular disease (HCC)    Presence of IVC filter   Heparin Dosing  Weight: 108.2 kg  Medications:  PTA: Xarelto 20 mg q24h, last dose: 5/28 (PM)  Assessment: Pt is 51 yo male presenting to ED c/o SOB, N/V, fever, chills, & R lower leg pain with h/o chronic PE, believed to be failing on Xarelto per admitting MD.  Date Time aPTT/HL Rate/Comment 530 0141 83s / >1.1 Thera x1; 1750 un/hr 5/30 0620 45s / >1.1 Subthera; 1750 > 2100 un/hr 5/30 1400 66s / --  therapeutic x1; 2100 un/hr    5/30 1947 58s / --  Subtherapeutic; 2100 > 2300 un/hr 5/31 0604 70s / 0.56 Therapeutic x 1 5/31 1228 47/0.37 No correlating and aPTT is subtherapeutic.  5/31 2012 66/ 0.51 Therapeutic x 1  6/01  0221 HL 0.49 Therapeutic x 2   Goal of Therapy:  Heparin level 0.3-0.7 units/ml aPTT 66-108 seconds Monitor platelets by anticoagulation protocol: Yes   Plan:  Continue heparin infusion at 2500 units/hr.  Check HL daily while therapeutic on heparin CBC daily  Otelia Sergeant, PharmD, [facility] 01/28/2022 3:25 AM

## 2022-01-28 NOTE — Progress Notes (Signed)
NAME:  Ryan [name], MRN:  [id number], DOB:  [birth year], LOS: 2 ADMISSION DATE:  01/25/2022, CONSULTATION DATE:  01/25/2022 REFERRING MD:  Dr. 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  On presentation he was noted to be dyspneic with mild to moderate respiratory distress and hypoxic (O2 sats of 84%).  He also endorsed, nausea, vomiting, fever, chills, right lower extremity leg pain.  Of note patient was recently admitted to [facility] from 5//23 through 01/04/2022 due to complaints of shortness of breath, fever, chills.  He was admitted for severe sepsis and acute hypoxic respiratory failure due to suspected pneumonia, acute on chronic HFpEF, and bilateral nonocclusive DVT to bilateral lower extremities.  01/27/22- patient is markedly improved. He is now on room air and is breathing nonlabored saturating 100%.  He is on heparin but from pulmonary perspective is stable for dc planning. I recommend having another heme/onc evaluation since he states he did previously see heme/onc but did not get answer as to why he is having recurrent thrombosis despite full anticoagualation.   01/28/22- patient is clear to auscultation on examination and he is on room air. He has been seen by pulmonary in [place] March 2023. He has additional follow up scheduled. Reviewed care plan with Dr Cyndia Skeeters and plan is to get him to outpatient heme/onc as well.   Pertinent  Medical History   Past Medical History:  Diagnosis Date   CHF (congestive heart failure) ([place])     Diabetes mellitus without complication ([place])    per pt-pre diabetic-Dr Rosario Jacks stated said pt   DVT (deep venous thrombosis) ([place])    Hypertension    Peripheral vascular disease ([place])    Presence of IVC filter      Micro Data:  5/29: SARS-CoV-2 and influenza PCR>> negative 5/29: Blood culture x2>> 5/29: Urine>>  Antimicrobials:  Cefepime 5/29>> Vancomycin 5/29>>  Significant Hospital Events: Including procedures, antibiotic start and stop dates in addition to other pertinent events   5/29: Admitted by hospitalist.  Pulmonary consulted.  Interim History / Subjective:  -Patient admitted by hospitalist -PCCM asked to see in pulmonary consultation due to concern for severe pulmonary hypertension -Patient on 4 L nasal cannula, no acute distress  Objective   Blood pressure 138/88, pulse 76, temperature 98.8 F (37.1 C), resp. rate 20, height 5\' 7"  (1.702 m), weight (!) 167.6 kg, SpO2 93 %.        Intake/Output Summary (Last 24 hours) at 01/28/2022 [id number] Last data filed at 01/27/2022 2112 Gross per 24 hour  Intake 1417.39 ml  Output 1075 ml  Net 342.39 ml    Filed Weights   01/25/22 2136 01/26/22 1553  Weight: (!) 168 kg (!) 167.6 kg    Examination: General: Acute on chronically ill-appearing morbidly obese male, lying in bed, on nasal cannula, mild respiratory distress HENT: Atraumatic, no swelling, neck supple, difficult to assess JVD due to body habitus Lungs: Coarse breath sounds bilaterally, even, mild tachypnea Cardiovascular: Tachycardic, regular rhythm, S1-S2, no murmurs, rubs, gallops  Abdomen: Obese, soft, nontender, no guarding or rebound tenderness, bowel sounds positive x4 Extremities: Bilateral lower extremity edema Neuro: Awake and alert, oriented to person, place, time, moves extremities to commands, no focal deficits, speech clear GU: Deferred      Assessment & Plan:   Acute hypoxic respiratory failure in the setting of known pulmonary embolism,  suspected pulmonary hypertension, & questionable pneumonia -Supplemental O2 as needed to maintain O2 sats >90% -BiPAP if needed -Follow intermittent Chest X-ray & ABG as needed -Bronchodilators Prn -Diuresis as BP and renal function permits -Incentive spirometry  Known pulmonary emboli and bilateral DVTs status post IVC filter placement Chronic HFpEF without acute exacerbation Hypertension -Continuous cardiac monitoring -Maintain MAP >65 -Trend lactic acid until normalized -Trend HS Troponin until peaked -Echocardiogram pending -Transition to heparin drip while in hospital (on Xarelto at home) -Consider hematology consult -Continue metoprolol -As needed hydralazine  Meet SIRS criteria on admission No obvious source of infection at this time -Monitor fever curve -Trend WBC's, procalcitonin negative x1 -Follow cultures as above -Initial Chest x-ray without obvious pneumonia, urinalysis negative for UTI -Continue empiri cefepime and vancomycin for now pending cultures and sensitivities  Chronic kidney disease stage III -Monitor I&O's / urinary output -Follow BMP -Ensure adequate renal perfusion -Avoid nephrotoxic agents as able -Replace electrolytes as indicated      Best Practice (right click and "Reselect all SmartList Selections" daily)   Diet/type: Regular consistency (see orders) DVT prophylaxis: systemic heparin GI prophylaxis: PPI Lines: N/A Foley:  N/A Code Status:  full code Last date of multidisciplinary goals of care discussion [N/A]  Labs   CBC: Recent Labs  Lab 01/25/22 2145 01/27/22 0515 01/28/22 0221  WBC 8.4 5.4 4.8  NEUTROABS 6.2  --   --   HGB 14.4 14.2 13.8  HCT 45.6 45.6 43.3  MCV 94.0 94.8 92.7  PLT 204 166 180     Basic Metabolic Panel: Recent Labs  Lab 01/25/22 2145 01/26/22 0620 01/27/22 0515 01/28/22 0221  NA 138 136 134* 136  K 4.1 4.2 4.0 3.8  CL 104 103 104 107  CO2 24 23 19* 23  GLUCOSE 116* 118* 111* 122*  BUN 12  12 13 17   CREATININE 1.30* 1.60* 1.39* 1.36*  CALCIUM 8.4* 8.6* 8.2* 8.3*  MG  --  1.7 2.2 2.2  PHOS  --   --  3.1 3.5    GFR: Estimated Creatinine Clearance: 95.9 mL/min (A) (by C-G formula based on SCr of 1.36 mg/dL (H)). Recent Labs  Lab 01/25/22 2145 01/26/22 0045 01/26/22 0620 01/27/22 0515 01/28/22 0221  PROCALCITON 0.10  --   --  0.95 0.61  WBC 8.4  --   --  5.4 4.8  LATICACIDVEN 2.2* 2.1* 1.4  --   --      Liver Function Tests: Recent Labs  Lab 01/25/22 2145 01/28/22 0221  AST 22  --   ALT 25  --   ALKPHOS 58  --   BILITOT 0.8  --   PROT 8.0  --   ALBUMIN 3.8 3.3*    No results for input(s): LIPASE, AMYLASE in the last 168 hours. No results for input(s): AMMONIA in the last 168 hours.  ABG No results found for: PHART, PCO2ART, PO2ART, HCO3, TCO2, ACIDBASEDEF, O2SAT   Coagulation Profile: Recent Labs  Lab 01/25/22 2145 01/26/22 0141  INR 1.4* 1.5*     Cardiac Enzymes: No results for input(s): CKTOTAL, CKMB, CKMBINDEX, TROPONINI in the last 168 hours.  HbA1C: Hemoglobin A1C  Date/Time Value Ref Range Status  12/26/2014 05:20 AM 6.1 (H) % Final    Comment:    4.0-6.0 NOTE: New Reference Range  11/05/14   12/25/2014 03:05 AM 5.9 % Final    Comment:    4.0-6.0 NOTE: New Reference Range  11/05/14    Hgb A1c MFr Bld  Date/Time Value Ref Range Status  01/26/2022 06:20 AM 6.2 (H) 4.8 - 5.6 % Final    Comment:    (NOTE) Pre diabetes:          5.7%-6.4%  Diabetes:              >6.4%  Glycemic control for   <7.0% adults with diabetes   10/29/2021 09:14 AM 6.5 (H) 4.8 - 5.6 % Final    Comment:             Prediabetes: 5.7 - 6.4          Diabetes: >6.4          Glycemic control for adults with diabetes: <7.0     CBG: Recent Labs  Lab 01/26/22 1216 01/26/22 1623 01/27/22 0918 01/27/22 1125 01/27/22 1653  GLUCAP 99 130* 150* 148* 117*     Review of Systems:   Positives in BOLD: Gen: Denies fever, chills, weight change,  fatigue, night sweats HEENT: Denies blurred vision, double vision, hearing loss, tinnitus, sinus congestion, rhinorrhea, sore throat, neck stiffness, dysphagia PULM: Denies shortness of breath, cough, sputum production, hemoptysis, wheezing CV: Denies chest pain, edema, orthopnea, paroxysmal nocturnal dyspnea, palpitations GI: Denies abdominal pain, nausea, vomiting, diarrhea, hematochezia, melena, constipation, change in bowel habits GU: Denies dysuria, hematuria, polyuria, oliguria, urethral discharge Endocrine: Denies hot or cold intolerance, polyuria, polyphagia or appetite change Derm: Denies rash, dry skin, scaling or peeling skin change Heme: Denies easy bruising, bleeding, bleeding gums Neuro: Denies headache, numbness, weakness, slurred speech, loss of memory or consciousness   Past Medical History:  He,  has a past medical history of CHF (congestive heart failure) ([place]), Diabetes mellitus without complication ([place]), DVT (deep venous thrombosis) ([place]), Hypertension, Peripheral vascular disease ([place]), and Presence of IVC filter.   Surgical History:   Past Surgical History:  Procedure Laterality Date   CARDIAC ELECTROPHYSIOLOGY STUDY AND ABLATION  10/2020   PERIPHERAL VASCULAR CATHETERIZATION N/A 01/10/2015   Procedure: Dialysis/Perma Catheter Insertion;  Surgeon: Katha Cabal, MD;  Location: [place] [facility];  Service: Cardiovascular;  Laterality: N/A;   PERIPHERAL VASCULAR CATHETERIZATION N/A 02/24/2015   Procedure: Dialysis/Perma Catheter Removal;  Surgeon: Algernon Huxley, MD;  Location: [place] [facility];  Service: Cardiovascular;  Laterality: N/A;     Social History:   reports that he has never smoked. He has never used smokeless tobacco. He reports that he does not drink alcohol and does not use drugs.   Family History:  His family history includes Breast cancer in his mother; Cancer in his mother; Diabetes in his father; Hypertension in his father and mother;  Prostate cancer in his father; Pulmonary embolism in his paternal grandfather and paternal uncle.   Allergies Allergies  Allergen Reactions   Iodinated Contrast Media Other (See Comments)    Kidney failure requiring dialysis in 2013. He has had cta for PE since 2013.     Metrizamide Other (See Comments)    Kidney failure requiring dialysis Kidney failure requiring dialysis    Clindamycin/Lincomycin Rash   Lincomycin Rash   Sulfa Antibiotics Rash     Home Medications  Prior to Admission medications  Medication Sig Start Date End Date Taking? Authorizing Provider  amLODipine (NORVASC) 10 MG tablet Take 10 mg by mouth daily. 12/04/21  Yes [provider]  empagliflozin (JARDIANCE) 25 MG TABS tablet Take 1 tablet (25 mg total) by mouth daily before breakfast. 12/09/21  Yes Johnson, Megan P, DO  losartan (COZAAR) 50 MG tablet Take 50 mg by mouth daily. 03/11/20  Yes [provider]  metoprolol succinate (TOPROL-XL) 25 MG 24 hr tablet Take 1 tablet by mouth daily.   Yes [provider]  XARELTO 20 MG TABS tablet Take 20 mg by mouth daily. 12/07/21  Yes [provider]  magnesium oxide (MAG-OX) 400 MG tablet Take 1 tablet by mouth in the morning and at bedtime. Patient not taking: Reported on 01/25/2022 10/23/21 10/23/22  [provider]  spironolactone (ALDACTONE) 50 MG tablet Take 0.5 tablets (25 mg total) by mouth daily. Patient not taking: Reported on 01/25/2022 01/04/22   Annita Brod, MD       Ottie Glazier, M.D.  Pulmonary & Critical Care Medicine

## 2022-01-28 NOTE — TOC Transition Note (Signed)
Transition of Care [facility]) - CM/SW Discharge Note   Patient Details  Name: Ryan [name] MRN: [id number] Date of Birth: [birth year]  Transition of Care [facility] CM/SW Contact:  Truddie Hidden, RN Phone Number: 01/28/2022, 11:56 AM   Clinical Narrative:    Discharge ordered. Patient requires new home oxygen. Requested oxygen via [employer]. Oxygen to be delivered to patient's room. TOC.          Patient Goals and CMS Choice        Discharge Placement                       Discharge Plan and Services                                     Social Determinants of Health (SDOH) Interventions     Readmission Risk Interventions     View : No data to display.

## 2022-01-28 NOTE — Progress Notes (Signed)
SATURATION QUALIFICATIONS: (This note is used to comply with regulatory documentation for home oxygen)  Patient Saturations on Room Air at Rest = 88%  Patient Saturations on Room Air while Ambulating = 86%  Patient Saturations on 2 Liters of oxygen while Ambulating = 91%  Please briefly explain why patient needs home oxygen: HR increased 122 while ambulating on RA

## 2022-01-28 NOTE — Discharge Summary (Signed)
Physician Discharge Summary  Ryan [name] [id number] DOB: [birth year] DOA: 01/25/2022  PCP: Jon Billings, NP  Admit date: 01/25/2022 Discharge date: 01/28/2022 Admitted From: Home Disposition: Home Recommendations for Outpatient Follow-up:  Follow ups as below. Please obtain CBC/BMP/Mag at follow up Please follow up on the following pending results: None  Home Health: Not indicated Equipment/Devices: Home oxygen, 2 L by [place]  Discharge Condition: Stable CODE STATUS: Full code  Follow-up Information     Jon Billings, NP. Schedule an appointment as soon as possible for a visit in 1 week(s).   Specialty: Nurse Practitioner Why: appointment on 02/10/22 @ 2.00pm Dr. Melven Sartorius information: [place] [address] [facility] course 51 year old M with PMH of diastolic CHF, A-fib/flutter s/p ablation on Xarelto, DVT/PE s/p IVC filter at the age of 51, venous insufficiency, DM-2 and morbid obesity presenting with shortness of breath and admitted for acute respiratory failure with hypoxia.  Desaturated to 84% on RA and started on supplemental oxygen.  Concern about pulmonary hypertension in the setting of known PE, pneumonia and OSA/OHS.  He was started on broad-spectrum antibiotics.  MRSA PCR screen, RVP, COVID-19, influenza PCR and blood cultures negative.  Urine culture with multiple species.  Respiratory symptoms improved.  Antibiotics de-escalated to IV ceftriaxone and azithromycin on 5/31.  Patient's respiratory symptoms improved although he required 2 L by [place] to maintain appropriate saturation with exertion.  He is discharged on p.o. Augmentin and azithromycin to complete 5 days course.  We have changed his Aldactone to Lasix for his diastolic CHF.  We also stopped his amlodipine given his lower extremity edema and venous insufficiency.  He was advised to take his Xarelto with meals to ensure good observation.  Ambulatory  referral to hematology and oncology ordered for further evaluation.  Pulmonology to arrange outpatient follow-up.  Patient needs referral to sleep clinic to rule out sleep apnea.  He also needs to lose some weight.    See individual problem list below for more on hospital course.  Problems addressed during this hospitalization Problem  Acute Respiratory Failure With Hypoxia (Hcc)  History of pulmonary embolism and DVT  Morbid Obesity With Bmi of 50.0-59.9, Adult (Hcc)  Severe Sepsis (Hcc)  Diabetes Mellitus Without Complication (Hcc)  Htn (Hypertension)  CKD (chronic kidney disease), stage IIIa  Physical Deconditioning  At Risk for Sleep Apnea  Sob (Shortness of Breath)  Pulmonary Emboli (Hcc) (Resolved)    Assessment and Plan: * Acute respiratory failure with hypoxia (HCC) Desaturated to 84% on RA and started on supplemental oxygen.  Multifactorial including pulmonary hypertension from known PE, pneumonia,  possible OSA/OHS and diastolic CHF.  CXR without acute finding.  Procalcitonin elevated to 0.95.  He reports good compliance with Xarelto.  Has not been evaluated for sleep apnea.  Overall, reports improvement with broad-spectrum antibiotics.  Recent TTE on 5/6 with LVEF of 60 to 65%, G2-DD and RVSP of 30 mmHg.  Difficult to assess fluid status due to body habitus.  Initially started on vancomycin, cefepime and Flagyl.  Antibiotics de-escalated to ceftriaxone and azithromycin.  He is discharged on p.o. Augmentin and azithromycin to complete course. Changed Aldactone to p.o. Lasix for his CHF.  Transitioned from IV heparin to home Xarelto for his PE.  He was advised to take his Xarelto with food.  Ambulatory referral to hematology ordered.  Pulmonology to arrange outpatient follow-up.  Needs to rule out sleep apnea.  He is discharged on 2 L by nasal cannula to be used with exertion   History of pulmonary embolism and DVT S/p IVC filter on Xarelto prior to admission. Discharged on home  Xarelto.  Advised to take Xarelto with food.  Chronic diastolic CHF (congestive heart failure) (HCC) continue metoprolol.  PRN hydralazine.   Morbid obesity with BMI of 50.0-59.9, adult (HCC) Body mass index is 57.86 kg/m. Encourage lifestyle change to lose weight. Could benefit from GLP-1 agonist  Severe sepsis (HCC) Likely due to pneumonia.    Diabetes mellitus without complication (HCC) [id number] about 3 months ago. Recent Labs  Lab 01/27/22 0918 01/27/22 1125 01/27/22 1653 01/28/22 0839 01/28/22 1111  GLUCAP 150* 148* 117* 119* 110*  Discharged on home medications.  May consider adding GLP-1 agonist which could also help with weight, cardiac disease and renal disease.    HTN (hypertension) Continue Toprol-XL. D/c amlodipine-not a great option given his chronic venous sufficiency and BLE edema. Patient to hold losartan for 5 more days. Changed Aldactone to Lasix   CKD (chronic kidney disease), stage IIIa Recent Labs    06/06/21 0429 07/29/21 1411 10/29/21 0914 12/31/21 1828 01/01/22 0828 01/02/22 0500 01/04/22 0521 01/25/22 2145 01/26/22 0620 01/27/22 0515 01/28/22 0221  BUN 18 13 18 16   --  15 19 12 12 13 17   CREATININE 1.38* 1.43* 1.57* 1.42* 1.44* 1.41* 1.33* 1.30* 1.60* 1.39* 1.36*  Creatinine seems to be at baseline now. Patient to hold losartan for 5 days.  Discontinued Aldactone.  Recheck renal function in 1 to 2 weeks    At risk for sleep apnea Encourage outpatient sleep study  Physical deconditioning Evaluated by therapy and no need was identified  SOB (shortness of breath)                   Vital signs Vitals:   01/27/22 1720 01/27/22 1939 01/28/22 0401 01/28/22 1114  BP:  124/83 138/88 135/86  Pulse:  81 76 91  Temp:  98.3 F (36.8 C) 98.8 F (37.1 C) 97.9 F (36.6 C)  Resp:  20 20 18   Height:      Weight:      SpO2: 92% (!) 88% 93% 95%  TempSrc:  Oral  Oral  BMI (Calculated):         Discharge  exam  GENERAL: No apparent distress.  Nontoxic. HEENT: MMM.  Vision and hearing grossly intact.  NECK: Supple.  Difficult to assess JVD due to body habitus. RESP:  No IWOB.  Fair aeration bilaterally but limited exam due to body habitus.. CVS:  RRR. Heart sounds normal.  ABD/GI/GU: BS+. Abd soft, NTND.  MSK/EXT:  No apparent deformity.  Difficult to assess edema due to chronic venous insufficiency. SKIN: Chronic venous insufficiency. NEURO: Awake and alert. Oriented appropriately.  No apparent focal neuro deficit. PSYCH: Calm. Normal affect.   Discharge Instructions Discharge Instructions     Ambulatory referral to Hematology / Oncology   Complete by: As directed    Call MD for:  difficulty breathing, headache or visual disturbances   Complete by: As directed    Call MD for:  extreme fatigue   Complete by: As directed    Call MD for:  persistant dizziness or light-headedness   Complete by: As directed    Diet - low sodium heart healthy   Complete by: As directed    Diet Carb Modified   Complete by: As directed  Discharge instructions   Complete by: As directed    It has been a pleasure taking care of you!  You were hospitalized due to shortness of breath likely from pneumonia, underlying pulmonary embolism, heart failure and possible sleep apnea.  We have started you on antibiotics.  We are discharging you on more antibiotics to complete treatment course.  We have also changed your Aldactone to furosemide for the heart failure.  We have stopped the amlodipine since it caused this more swelling in your leg.  We strongly recommend you taking Xarelto with food.  Please review your new medication list and the directions on your medications before you take them.  We suggest you ask your primary care doctor for referral to sleep clinic for sleep apnea evaluation.  In addition to taking your medications as prescribed, we also recommend you avoid alcohol or over-the-counter pain  medication other than plain Tylenol, limit the amount of water/fluid you drink to less than 6 cups (1500 cc) a day,  limit your sodium (salt) intake to less than 2 g (2000 mg) a day and weigh yourself daily at the same time and keeping your weight log.         Take care,   Increase activity slowly   Complete by: As directed       Allergies as of 01/28/2022       Reactions   Iodinated Contrast Media Other (See Comments)   Kidney failure requiring dialysis in 2013. He has had cta for PE since 2013.    Metrizamide Other (See Comments)   Kidney failure requiring dialysis Kidney failure requiring dialysis   Clindamycin/lincomycin Rash   Lincomycin Rash   Sulfa Antibiotics Rash        Medication List     STOP taking these medications    amLODipine 10 MG tablet Commonly known as: NORVASC   magnesium oxide 400 MG tablet Commonly known as: MAG-OX   spironolactone 50 MG tablet Commonly known as: ALDACTONE       TAKE these medications    amoxicillin-clavulanate 1000-62.5 MG 12 hr tablet Commonly known as: AUGMENTIN XR Take 2 tablets by mouth 2 (two) times daily for 4 days.   azithromycin 500 MG tablet Commonly known as: Zithromax Take 1 tablet (500 mg total) by mouth daily for 3 days. Take 1 tablet daily for 3 days.   empagliflozin 25 MG Tabs tablet Commonly known as: Jardiance Take 1 tablet (25 mg total) by mouth daily before breakfast.   furosemide 40 MG tablet Commonly known as: Lasix Take 1 tablet (40 mg total) by mouth daily.   losartan 50 MG tablet Commonly known as: COZAAR Take 1 tablet (50 mg total) by mouth daily. Start taking on: February 01, 2022 What changed: These instructions start on February 01, 2022. If you are unsure what to do until then, ask your doctor or other care provider.   metoprolol succinate 25 MG 24 hr tablet Commonly known as: TOPROL-XL Take 1 tablet by mouth daily.   Xarelto 20 MG Tabs tablet Generic drug: rivaroxaban Take 20 mg by  mouth daily.               Durable Medical Equipment  (From admission, onward)           Start     Ordered   01/28/22 1110  For home use only DME oxygen  Once       Question Answer Comment  Length of Need 6 Months   Mode  or (Route) Nasal cannula   Liters per Minute 2   Frequency Continuous (stationary and portable oxygen unit needed)   Oxygen delivery system Gas      01/28/22 1109            Consultations: Pulmonology  Procedures/Studies:   CT Angio Chest Pulmonary Embolism (PE) W or WO Contrast  Result Date: 12/31/2021 CLINICAL DATA:  Concern for pulmonary embolism. EXAM: CT ANGIOGRAPHY CHEST WITH CONTRAST TECHNIQUE: Multidetector CT imaging of the chest was performed using the standard protocol during bolus administration of intravenous contrast. Multiplanar CT image reconstructions and MIPs were obtained to evaluate the vascular anatomy. RADIATION DOSE REDUCTION: This exam was performed according to the departmental dose-optimization program which includes automated exposure control, adjustment of the mA and/or kV according to patient size and/or use of iterative reconstruction technique. CONTRAST:  164mL OMNIPAQUE IOHEXOL 350 MG/ML SOLN COMPARISON:  Chest CT dated 01/24/2020. FINDINGS: Cardiovascular: There is no cardiomegaly or pericardial effusion. The thoracic aorta is unremarkable. The origins of the great vessels of the aortic arch appear patent. Mildly dilated pulmonary trunk suggestive of a degree of pulmonary hypertension. Similar appearance of nonocclusive thrombus in the periphery of the right mainstem bronchus, chronic. Faint small intraluminal nonocclusive and bandlike defects involving the right lower lobe pulmonary artery branch (189/6 and 181/6) most likely scarring related to prior PE. No definite acute or occlusive pulmonary artery embolus identified. Mediastinum/Nodes: There is no hilar or mediastinal adenopathy. The esophagus is grossly unremarkable. No  mediastinal fluid collection. Lungs/Pleura: No focal consolidation, pleural effusion, or pneumothorax. The central airways are patent. Upper Abdomen: Fatty liver. An IVC filter at the level of the intrahepatic IVC noted. Musculoskeletal: Mild degenerative changes of the spine. No acute osseous pathology. Review of the MIP images confirms the above findings. IMPRESSION: 1. No acute intrathoracic pathology. No acute or occlusive pulmonary artery embolus identified. Small right pulmonary artery branch chronic PE/scarring significantly decreased since the study of 2021. 2. Fatty liver. Electronically Signed   By: Anner Crete M.D.   On: 12/31/2021 23:32   US Venous Img Lower Bilateral (DVT)  Result Date: 01/01/2022 CLINICAL DATA:  Bilateral leg pain and swelling EXAM: BILATERAL LOWER EXTREMITY VENOUS DOPPLER ULTRASOUND TECHNIQUE: Gray-scale sonography with graded compression, as well as color Doppler and duplex ultrasound were performed to evaluate the lower extremity deep venous systems from the level of the common femoral vein and including the common femoral, femoral, profunda femoral, popliteal and calf veins including the posterior tibial, peroneal and gastrocnemius veins when visible. The superficial great saphenous vein was also interrogated. Spectral Doppler was utilized to evaluate flow at rest and with distal augmentation maneuvers in the common femoral, femoral and popliteal veins. COMPARISON:  None Available. FINDINGS: RIGHT LOWER EXTREMITY Common Femoral Vein: Thrombus is noted with decreased compressibility Saphenofemoral Junction: Compass is noted with decreased compressibility. Profunda Femoral Vein: Thrombus is noted with decreased compressibility. Femoral Vein: Thrombus is noted with decreased compressibility. Popliteal Vein: Thrombus is noted with decreased compressibility. Calf Veins: Not well visualized. Superficial Great Saphenous Vein: No evidence of thrombus. Normal compressibility. Venous  Reflux:  None. Other Findings:  None. LEFT LOWER EXTREMITY Common Femoral Vein: Thrombus is noted with decreased compressibility Saphenofemoral Junction: Compass is noted with decreased compressibility. Profunda Femoral Vein: Thrombus is noted with decreased compressibility. Femoral Vein: Thrombus is noted with decreased compressibility. Popliteal Vein: Thrombus is noted with decreased compressibility. Calf Veins: Not well visualized. Superficial Great Saphenous Vein: No evidence of thrombus. Normal compressibility. Venous Reflux:  None. Other Findings:  None. IMPRESSION: Symmetrical nonocclusive thrombus in both legs. This is likely subacute to chronic in nature. Electronically Signed   By: Inez Catalina M.D.   On: 01/01/2022 01:06   DG Chest Port 1 View  Result Date: 01/25/2022 CLINICAL DATA:  Cough, short of breath, chest pain EXAM: PORTABLE CHEST 1 VIEW COMPARISON:  12/31/2021 FINDINGS: The heart size and mediastinal contours are within normal limits. Both lungs are clear. The visualized skeletal structures are unremarkable. IMPRESSION: No active disease. Electronically Signed   By: Randa Ngo M.D.   On: 01/25/2022 21:48   DG Chest Port 1 View  Result Date: 12/31/2021 CLINICAL DATA:  Leg pain, known LEFT ankle wound, chills, shortness of breath, history pulmonary embolism, question sepsis EXAM: PORTABLE CHEST 1 VIEW COMPARISON:  Portable exam 1835 hours compared to 01/24/2020 FINDINGS: Normal heart size, mediastinal contours, and pulmonary vascularity. Minimal RIGHT basilar atelectasis. Lungs otherwise clear. No infiltrate, pleural effusion, or pneumothorax. IMPRESSION: Minimal RIGHT basilar atelectasis. Electronically Signed   By: Lavonia Dana M.D.   On: 12/31/2021 18:43   DG Abd Portable 1V  Result Date: 01/02/2022 CLINICAL DATA:  Obstipation.  Sepsis. EXAM: PORTABLE ABDOMEN - 1 VIEW COMPARISON:  12/31/2014 FINDINGS: Small bowel and colonic gas is seen, however there is no evidence of dilated bowel  loops. No definite radiopaque calculi identified. Two IVC filters are seen in place. IMPRESSION: Nonspecific, nonobstructive bowel gas pattern. Electronically Signed   By: Marlaine Hind M.D.   On: 01/02/2022 10:49   ECHOCARDIOGRAM COMPLETE  Result Date: 01/02/2022    ECHOCARDIOGRAM REPORT   Patient Name:   Ryan [name] Date of Exam: 01/02/2022 Medical Rec #:  [id number]     Height:       67.0 in Accession #:    [id number]    Weight:       360.0 lb Date of Birth:  Jan 01, 1971     BSA:          2.596 m Patient Age:    29 years      BP:           150/93 mmHg Patient Gender: M             HR:           74 bpm. Exam Location:  [facility] Procedure: 2D Echo and Intracardiac Opacification Agent Indications:     CHF  History:         Patient has prior history of Echocardiogram examinations. CHF;                  Risk Factors:Morbid Obesity, Hypertension and Diabetes.  Sonographer:     L Thornton-Maynard Referring Phys:  [place] Diagnosing Phys: Ida Rogue MD  Sonographer Comments: Image acquisition challenging due to patient body habitus. IMPRESSIONS  1. Left ventricular ejection fraction, by estimation, is 60 to 65%. The left ventricle has normal function. The left ventricle has no regional wall motion abnormalities. Left ventricular diastolic parameters are consistent with Grade II diastolic dysfunction (pseudonormalization).  2. Right ventricular systolic function is normal. The right ventricular size is normal. There is normal pulmonary artery systolic pressure. The estimated right ventricular systolic pressure is 0000000 mmHg.  3. The mitral valve is normal in structure. Mild mitral valve regurgitation. No evidence of mitral stenosis.  4. The aortic valve was not well visualized. Aortic valve regurgitation is not visualized. No aortic stenosis is present.  5. There is mild dilatation of  the aortic root, measuring 40 mm.  6. The inferior vena cava is normal in size with greater than 50% respiratory variability,  suggesting right atrial pressure of 3 mmHg. FINDINGS  Left Ventricle: Left ventricular ejection fraction, by estimation, is 60 to 65%. The left ventricle has normal function. The left ventricle has no regional wall motion abnormalities. The left ventricular internal cavity size was normal in size. There is  no left ventricular hypertrophy. Left ventricular diastolic parameters are consistent with Grade II diastolic dysfunction (pseudonormalization). Right Ventricle: The right ventricular size is normal. No increase in right ventricular wall thickness. Right ventricular systolic function is normal. There is normal pulmonary artery systolic pressure. The tricuspid regurgitant velocity is 2.24 m/s, and  with an assumed right atrial pressure of 10 mmHg, the estimated right ventricular systolic pressure is 0000000 mmHg. Left Atrium: Left atrial size was normal in size. Right Atrium: Right atrial size was normal in size. Pericardium: There is no evidence of pericardial effusion. Mitral Valve: The mitral valve is normal in structure. Mild mitral valve regurgitation. No evidence of mitral valve stenosis. Tricuspid Valve: The tricuspid valve is normal in structure. Tricuspid valve regurgitation is mild . No evidence of tricuspid stenosis. Aortic Valve: The aortic valve was not well visualized. Aortic valve regurgitation is not visualized. No aortic stenosis is present. Aortic valve mean gradient measures 7.0 mmHg. Aortic valve peak gradient measures 11.0 mmHg. Aortic valve area, by VTI measures 3.21 cm. Pulmonic Valve: The pulmonic valve was normal in structure. Pulmonic valve regurgitation is not visualized. No evidence of pulmonic stenosis. Aorta: The aortic root is normal in size and structure. There is mild dilatation of the aortic root, measuring 40 mm. Venous: The inferior vena cava is normal in size with greater than 50% respiratory variability, suggesting right atrial pressure of 3 mmHg. IAS/Shunts: No atrial level  shunt detected by color flow Doppler.  LEFT VENTRICLE PLAX 2D LVOT diam:     2.40 cm      Diastology LV SV:         94           LV e' medial:    7.45 cm/s LV SV Index:   36           LV E/e' medial:  12.5 LVOT Area:     4.52 cm     LV e' lateral:   10.30 cm/s                             LV E/e' lateral: 9.0  LV Volumes (MOD) LV vol d, MOD A2C: 67.2 ml LV vol d, MOD A4C: 105.0 ml LV vol s, MOD A2C: 30.0 ml LV vol s, MOD A4C: 33.0 ml LV SV MOD A2C:     37.2 ml LV SV MOD A4C:     105.0 ml LV SV MOD BP:      54.3 ml RIGHT VENTRICLE RV S prime:     8.15 cm/s TAPSE (M-mode): 2.6 cm LEFT ATRIUM             Index        RIGHT ATRIUM           Index LA Vol (A2C):   55.0 ml 21.19 ml/m  RA Area:     22.40 cm LA Vol (A4C):   56.4 ml 21.72 ml/m  RA Volume:   73.20 ml  28.20 ml/m LA Biplane Vol: 56.1 ml 21.61  ml/m  AORTIC VALVE                     PULMONIC VALVE AV Area (Vmax):    3.30 cm      PV Vmax:          0.77 m/s AV Area (Vmean):   3.26 cm      PV Peak grad:     2.3 mmHg AV Area (VTI):     3.21 cm      PR End Diast Vel: 2.23 msec AV Vmax:           166.00 cm/s AV Vmean:          121.000 cm/s AV VTI:            0.293 m AV Peak Grad:      11.0 mmHg AV Mean Grad:      7.0 mmHg LVOT Vmax:         121.00 cm/s LVOT Vmean:        87.300 cm/s LVOT VTI:          0.208 m LVOT/AV VTI ratio: 0.71  AORTA Ao Root diam: 3.90 cm Ao Asc diam:  3.50 cm MITRAL VALVE               TRICUSPID VALVE MV Area (PHT): 3.93 cm    TR Peak grad:   20.1 mmHg MV E velocity: 93.00 cm/s  TR Vmax:        224.00 cm/s MV A velocity: 49.70 cm/s MV E/A ratio:  1.87        SHUNTS                            Systemic VTI:  0.21 m                            Systemic Diam: 2.40 cm Ida Rogue MD Electronically signed by Ida Rogue MD Signature Date/Time: 01/02/2022/1:50:30 PM    Final        The results of significant diagnostics from this hospitalization (including imaging, microbiology, ancillary and laboratory) are listed below for reference.      Microbiology: Recent Results (from the past 240 hour(s))  Resp Panel by RT-PCR (Flu A&B, Covid) Anterior Nasal Swab     Status: None   Collection Time: 01/25/22  9:45 PM   Specimen: Anterior Nasal Swab  Result Value Ref Range Status   SARS Coronavirus 2 by RT PCR NEGATIVE NEGATIVE Final    Comment: (NOTE) SARS-CoV-2 target nucleic acids are NOT DETECTED.  The SARS-CoV-2 RNA is generally detectable in upper respiratory specimens during the acute phase of infection. The lowest concentration of SARS-CoV-2 viral copies this assay can detect is 138 copies/mL. A negative result does not preclude SARS-Cov-2 infection and should not be used as the sole basis for treatment or other patient management decisions. A negative result may occur with  improper specimen collection/handling, submission of specimen other than nasopharyngeal swab, presence of viral mutation(s) within the areas targeted by this assay, and inadequate number of viral copies(<138 copies/mL). A negative result must be combined with clinical observations, patient history, and epidemiological information. The expected result is Negative.  Fact Sheet for Patients:  EntrepreneurPulse.com.au  Fact Sheet for Healthcare Providers:  IncredibleEmployment.be  This test is no t yet approved or cleared by the [place] and  has been authorized for  detection and/or diagnosis of SARS-CoV-2 by FDA under an Emergency Use Authorization (EUA). This EUA will remain  in effect (meaning this test can be used) for the duration of the COVID-19 declaration under Section 564(b)(1) of the Act, 21 U.S.C.section 360bbb-3(b)(1), unless the authorization is terminated  or revoked sooner.       Influenza A by PCR NEGATIVE NEGATIVE Final   Influenza B by PCR NEGATIVE NEGATIVE Final    Comment: (NOTE) The Xpert Xpress SARS-CoV-2/FLU/RSV plus assay is intended as an aid in the diagnosis of influenza  from Nasopharyngeal swab specimens and should not be used as a sole basis for treatment. Nasal washings and aspirates are unacceptable for Xpert Xpress SARS-CoV-2/FLU/RSV testing.  Fact Sheet for Patients: EntrepreneurPulse.com.au  Fact Sheet for Healthcare Providers: IncredibleEmployment.be  This test is not yet approved or cleared by the [place] FDA and has been authorized for detection and/or diagnosis of SARS-CoV-2 by FDA under an Emergency Use Authorization (EUA). This EUA will remain in effect (meaning this test can be used) for the duration of the COVID-19 declaration under Section 564(b)(1) of the Act, 21 U.S.C. section 360bbb-3(b)(1), unless the authorization is terminated or revoked.  Performed at [facility], [address] [place], [place] [address]   Blood Culture (routine x 2)     Status: None (Preliminary result)   Collection Time: 01/25/22  9:55 PM   Specimen: BLOOD  Result Value Ref Range Status   Specimen Description BLOOD RIGHT FOREARM  Final   Special Requests   Final    BOTTLES DRAWN AEROBIC AND ANAEROBIC Blood Culture adequate volume   Culture   Final    NO GROWTH 3 DAYS Performed at [facility], [address], [place], [place] [address]    Report Status PENDING  Incomplete  Urine Culture     Status: Abnormal   Collection Time: 01/25/22 10:00 PM   Specimen: Urine, Random  Result Value Ref Range Status   Specimen Description   Final    URINE, RANDOM Performed at [facility], [address] [place], [place] [address]    Special Requests   Final    NONE Performed at [facility], [address], [place], [place] [address]    Culture (A)  Final    30,000 COLONIES/mL MULTIPLE SPECIES PRESENT, SUGGEST RECOLLECTION   Report Status 01/27/2022 FINAL  Final  Blood Culture (routine x 2)     Status: None (Preliminary result)   Collection Time: 01/25/22 10:15 PM    Specimen: BLOOD  Result Value Ref Range Status   Specimen Description BLOOD RIGHT ARM  Final   Special Requests   Final    BOTTLES DRAWN AEROBIC AND ANAEROBIC Blood Culture results may not be optimal due to an inadequate volume of blood received in culture bottles   Culture   Final    NO GROWTH 3 DAYS Performed at [facility], [address], [place], [place] [address]    Report Status PENDING  Incomplete  Respiratory (~20 pathogens) panel by PCR     Status: None   Collection Time: 01/26/22  9:51 AM   Specimen: Nasopharyngeal Swab; Respiratory  Result Value Ref Range Status   Adenovirus NOT DETECTED NOT DETECTED Final   Coronavirus 229E NOT DETECTED NOT DETECTED Final    Comment: (NOTE) The Coronavirus on the Respiratory Panel, DOES NOT test for the novel  Coronavirus (2019 nCoV)    Coronavirus HKU1 NOT DETECTED NOT DETECTED Final   Coronavirus  NL63 NOT DETECTED NOT DETECTED Final   Coronavirus OC43 NOT DETECTED NOT DETECTED Final   Metapneumovirus NOT DETECTED NOT DETECTED Final   Rhinovirus / Enterovirus NOT DETECTED NOT DETECTED Final   Influenza A NOT DETECTED NOT DETECTED Final   Influenza B NOT DETECTED NOT DETECTED Final   Parainfluenza Virus 1 NOT DETECTED NOT DETECTED Final   Parainfluenza Virus 2 NOT DETECTED NOT DETECTED Final   Parainfluenza Virus 3 NOT DETECTED NOT DETECTED Final   Parainfluenza Virus 4 NOT DETECTED NOT DETECTED Final   Respiratory Syncytial Virus NOT DETECTED NOT DETECTED Final   Bordetella pertussis NOT DETECTED NOT DETECTED Final   Bordetella Parapertussis NOT DETECTED NOT DETECTED Final   Chlamydophila pneumoniae NOT DETECTED NOT DETECTED Final   Mycoplasma pneumoniae NOT DETECTED NOT DETECTED Final    Comment: Performed at [facility], [place] [address], [place], [place] [address]  MRSA Next Gen by PCR, Nasal     Status: None   Collection Time: 01/27/22 10:34 AM  Result Value Ref Range Status   MRSA by PCR Next Gen NOT DETECTED  NOT DETECTED Final    Comment: (NOTE) The GeneXpert MRSA Assay (FDA approved for NASAL specimens only), is one component of a comprehensive MRSA colonization surveillance program. It is not intended to diagnose MRSA infection nor to guide or monitor treatment for MRSA infections. Test performance is not FDA approved in patients less than 29 years old. Performed at [facility], [address] [place], [place] [address]      Labs:  CBC: Recent Labs  Lab 01/25/22 2145 01/27/22 0515 01/28/22 0221  WBC 8.4 5.4 4.8  NEUTROABS 6.2  --   --   HGB 14.4 14.2 13.8  HCT 45.6 45.6 43.3  MCV 94.0 94.8 92.7  PLT 204 166 180   BMP &GFR Recent Labs  Lab 01/25/22 2145 01/26/22 0620 01/27/22 0515 01/28/22 0221  NA 138 136 134* 136  K 4.1 4.2 4.0 3.8  CL 104 103 104 107  CO2 24 23 19* 23  GLUCOSE 116* 118* 111* 122*  BUN 12 12 13 17   CREATININE 1.30* 1.60* 1.39* 1.36*  CALCIUM 8.4* 8.6* 8.2* 8.3*  MG  --  1.7 2.2 2.2  PHOS  --   --  3.1 3.5   Estimated Creatinine Clearance: 95.9 mL/min (A) (by C-G formula based on SCr of 1.36 mg/dL (H)). Liver & Pancreas: Recent Labs  Lab 01/25/22 2145 01/28/22 0221  AST 22  --   ALT 25  --   ALKPHOS 58  --   BILITOT 0.8  --   PROT 8.0  --   ALBUMIN 3.8 3.3*   No results for input(s): LIPASE, AMYLASE in the last 168 hours. No results for input(s): AMMONIA in the last 168 hours. Diabetic: Recent Labs    01/26/22 0620  HGBA1C 6.2*   Recent Labs  Lab 01/27/22 0918 01/27/22 1125 01/27/22 1653 01/28/22 0839 01/28/22 1111  GLUCAP 150* 148* 117* 119* 110*   Cardiac Enzymes: No results for input(s): CKTOTAL, CKMB, CKMBINDEX, TROPONINI in the last 168 hours. No results for input(s): PROBNP in the last 8760 hours. Coagulation Profile: Recent Labs  Lab 01/25/22 2145 01/26/22 0141  INR 1.4* 1.5*   Thyroid Function Tests: No results for input(s): TSH, T4TOTAL, FREET4, T3FREE, THYROIDAB in the last 72 hours. Lipid  Profile: No results for input(s): CHOL, HDL, LDLCALC, TRIG, CHOLHDL, LDLDIRECT in the last 72 hours. Anemia Panel: No results for input(s): VITAMINB12, FOLATE,  FERRITIN, TIBC, IRON, RETICCTPCT in the last 72 hours. Urine analysis:    Component Value Date/Time   COLORURINE STRAW (A) 01/25/2022 2200   APPEARANCEUR CLEAR (A) 01/25/2022 2200   APPEARANCEUR Hazy 12/25/2014 1655   LABSPEC 1.015 01/25/2022 2200   LABSPEC 1.012 12/25/2014 1655   PHURINE 6.0 01/25/2022 2200   GLUCOSEU >=500 (A) 01/25/2022 2200   GLUCOSEU Negative 12/25/2014 1655   HGBUR SMALL (A) 01/25/2022 2200   BILIRUBINUR NEGATIVE 01/25/2022 2200   BILIRUBINUR Negative 12/25/2014 1655   KETONESUR NEGATIVE 01/25/2022 2200   PROTEINUR 30 (A) 01/25/2022 2200   NITRITE NEGATIVE 01/25/2022 2200   LEUKOCYTESUR NEGATIVE 01/25/2022 2200   LEUKOCYTESUR Negative 12/25/2014 1655   Sepsis Labs: Invalid input(s): PROCALCITONIN, LACTICIDVEN   Time coordinating discharge: 45 minutes  SIGNED:  Mercy Riding, MD  [facility] 01/28/2022, 6:22 PM

## 2022-01-28 NOTE — Plan of Care (Signed)

## 2022-01-29 ENCOUNTER — Ambulatory Visit: Payer: No Typology Code available for payment source | Admitting: Nurse Practitioner

## 2022-01-30 LAB — CULTURE, BLOOD (ROUTINE X 2)
Culture: NO GROWTH
Culture: NO GROWTH
Special Requests: ADEQUATE

## 2022-02-01 ENCOUNTER — Other Ambulatory Visit: Payer: Self-pay | Admitting: Student

## 2022-02-01 DIAGNOSIS — J9601 Acute respiratory failure with hypoxia: Secondary | ICD-10-CM

## 2022-02-01 MED ORDER — AMOXICILLIN-POT CLAVULANATE 875-125 MG PO TABS: 1.0000 | ORAL_TABLET | Freq: Two times a day (BID) | ORAL | 0 refills | Status: DC

## 2022-02-01 NOTE — Progress Notes (Signed)
Sent new Rx for Augmentin.

## 2022-02-02 NOTE — Progress Notes (Unsigned)
There were no vitals taken for this visit.   Subjective:    Patient ID: Ryan [name], male    DOB: [birth year], 51 y.o.   MRN: [id number]  HPI: Ryan [name] is a 51 y.o. male  No chief complaint on file.  Patient presents to clinic to establish care with new PCP.  Introduced to Designer, jewellery role and practice setting.  All questions answered.  Discussed provider/patient relationship and expectations.    Patient reports a history of HTN, elevated cholesterol, type 2 diabetes, CKD, Afib- had an ablation (does not see Cardiology).  Patient is seeing a Hydrographic surveyor at [facility].  He does have an IVC filter.  Vascular is causing him to have decreased blood flow to his lower extremities causing him to have recent cellulitis. He is walking daily to help with venous stasis.   Patient has had multiple blood clots. He has a blood clotting disorder.  He had a lower extremity clot removed and caused him to have renal failure.  He was on dialysis for about a year then no longer needed it.  See's Dr. Glennon Mac for Hematology at [facility].   Patient denies a history of:  Thyroid problems, Depression, Anxiety, Neurological problems, and Abdominal problems.    Denies HA, CP, SOB, dizziness, palpitations, visual changes, and lower extremity swelling.  Active Ambulatory Problems    Diagnosis Date Noted   History of pulmonary embolism and DVT 12/28/2014   PVC (premature ventricular contraction) 11/24/2016   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   HTN (hypertension) 12/15/2016   Fatigue 12/15/2016   Diabetes mellitus without complication ([place]) 123456   CKD (chronic kidney disease), stage IIIa 08/03/2017   Hepatic steatosis 05/30/2013   Knee pain 03/11/2020   Morbid obesity with BMI of 50.0-59.9, adult ([place]) 05/30/2013   Proteinuria 05/30/2013   SOB (shortness of breath) 12/20/2016   Weakness of both lower limbs 11/17/2017   Cellulitis of right foot 06/03/2021   Angina at rest [facility]) 07/29/2021    Hypercholesterolemia 07/29/2021   Acute respiratory failure with hypoxia ([place]) 01/01/2022   Severe sepsis ([place]) 01/01/2022   Chronic diastolic CHF (congestive heart failure) ([place]) 01/04/2022   VTE (venous thromboembolism) 01/26/2022   Sepsis with acute hypoxic respiratory failure ([place])    Physical deconditioning 01/27/2022   At risk for sleep apnea 01/27/2022   Resolved Ambulatory Problems    Diagnosis Date Noted   Pulmonary emboli ([place]) 11/22/2016   Acute respiratory distress 11/24/2016   Obesity, Class III, BMI 40-49.9 (morbid obesity) ([place]) 11/24/2016   Acute on chronic diastolic CHF (congestive heart failure) ([place]) 12/15/2016   Pulmonary embolus ([place]) 01/03/2017   CAP (community acquired pneumonia) 08/03/2017   Past Medical History:  Diagnosis Date   CHF (congestive heart failure) ([place])    DVT (deep venous thrombosis) ([facility])    Hypertension    Peripheral vascular disease ([place])    Presence of IVC filter    Past Surgical History:  Procedure Laterality Date   CARDIAC ELECTROPHYSIOLOGY STUDY AND ABLATION  10/2020   PERIPHERAL VASCULAR CATHETERIZATION N/A 01/10/2015   Procedure: Dialysis/Perma Catheter Insertion;  Surgeon: Katha Cabal, MD;  Location: [place] [facility];  Service: Cardiovascular;  Laterality: N/A;   PERIPHERAL VASCULAR CATHETERIZATION N/A 02/24/2015   Procedure: Dialysis/Perma Catheter Removal;  Surgeon: Algernon Huxley, MD;  Location: [place] [facility];  Service: Cardiovascular;  Laterality: N/A;   Family History  Problem Relation Age of Onset   Hypertension Mother    Cancer Mother  Breast cancer Mother    Prostate cancer Father    Hypertension Father    Diabetes Father    Pulmonary embolism Paternal Grandfather    Pulmonary embolism Paternal Uncle      Review of Systems  Eyes:  Negative for visual disturbance.  Respiratory:  Negative for chest tightness and shortness of breath.   Cardiovascular:  Negative for chest pain, palpitations and leg  swelling.  Neurological:  Negative for dizziness, light-headedness and headaches.   Per HPI unless specifically indicated above     Objective:    There were no vitals taken for this visit.  Wt Readings from Last 3 Encounters:  01/26/22 (!) 369 lb 6.4 oz (167.6 kg)  12/31/21 (!) 360 lb (163.3 kg)  10/29/21 (!) 359 lb 9.6 oz (163.1 kg)    Physical Exam Vitals and nursing note reviewed.  Constitutional:      General: He is not in acute distress.    Appearance: Normal appearance. He is obese. He is not ill-appearing, toxic-appearing or diaphoretic.  HENT:     Head: Normocephalic.     Right Ear: External ear normal.     Left Ear: External ear normal.     Nose: Nose normal. No congestion or rhinorrhea.     Mouth/Throat:     Mouth: Mucous membranes are moist.  Eyes:     General:        Right eye: No discharge.        Left eye: No discharge.     Extraocular Movements: Extraocular movements intact.     Conjunctiva/sclera: Conjunctivae normal.     Pupils: Pupils are equal, round, and reactive to light.  Cardiovascular:     Rate and Rhythm: Normal rate and regular rhythm.     Heart sounds: No murmur heard. Pulmonary:     Effort: Pulmonary effort is normal. No respiratory distress.     Breath sounds: Normal breath sounds. No wheezing, rhonchi or rales.  Abdominal:     General: Abdomen is flat. Bowel sounds are normal.  Musculoskeletal:     Cervical back: Normal range of motion and neck supple.  Skin:    General: Skin is warm and dry.     Capillary Refill: Capillary refill takes less than 2 seconds.  Neurological:     General: No focal deficit present.     Mental Status: He is alert and oriented to person, place, and time.  Psychiatric:        Mood and Affect: Mood normal.        Behavior: Behavior normal.        Thought Content: Thought content normal.        Judgment: Judgment normal.    Results for orders placed or performed during the hospital encounter of 01/25/22   Resp Panel by RT-PCR (Flu A&B, Covid) Anterior Nasal Swab   Specimen: Anterior Nasal Swab  Result Value Ref Range   SARS Coronavirus 2 by RT PCR NEGATIVE NEGATIVE   Influenza A by PCR NEGATIVE NEGATIVE   Influenza B by PCR NEGATIVE NEGATIVE  Blood Culture (routine x 2)   Specimen: BLOOD  Result Value Ref Range   Specimen Description BLOOD RIGHT FOREARM    Special Requests      BOTTLES DRAWN AEROBIC AND ANAEROBIC Blood Culture adequate volume   Culture      NO GROWTH 5 DAYS Performed at [facility], [address], [place], [place] [address]    Report Status 01/30/2022 FINAL  Blood Culture (routine x 2)   Specimen: BLOOD  Result Value Ref Range   Specimen Description BLOOD RIGHT ARM    Special Requests      BOTTLES DRAWN AEROBIC AND ANAEROBIC Blood Culture results may not be optimal due to an inadequate volume of blood received in culture bottles   Culture      NO GROWTH 5 DAYS Performed at [facility], [address] [place], [place] [address]    Report Status 01/30/2022 FINAL   Urine Culture   Specimen: Urine, Random  Result Value Ref Range   Specimen Description      URINE, RANDOM Performed at [facility], [address] [place], [place] [address]    Special Requests      NONE Performed at [facility], [address] [place], [place] [address]    Culture (A)     30,000 COLONIES/mL MULTIPLE SPECIES PRESENT, SUGGEST RECOLLECTION   Report Status 01/27/2022 FINAL   Respiratory (~20 pathogens) panel by PCR   Specimen: Nasopharyngeal Swab; Respiratory  Result Value Ref Range   Adenovirus NOT DETECTED NOT DETECTED   Coronavirus 229E NOT DETECTED NOT DETECTED   Coronavirus HKU1 NOT DETECTED NOT DETECTED   Coronavirus NL63 NOT DETECTED NOT DETECTED   Coronavirus OC43 NOT DETECTED NOT DETECTED   Metapneumovirus NOT DETECTED NOT DETECTED   Rhinovirus / Enterovirus NOT DETECTED NOT DETECTED   Influenza A NOT DETECTED NOT  DETECTED   Influenza B NOT DETECTED NOT DETECTED   Parainfluenza Virus 1 NOT DETECTED NOT DETECTED   Parainfluenza Virus 2 NOT DETECTED NOT DETECTED   Parainfluenza Virus 3 NOT DETECTED NOT DETECTED   Parainfluenza Virus 4 NOT DETECTED NOT DETECTED   Respiratory Syncytial Virus NOT DETECTED NOT DETECTED   Bordetella pertussis NOT DETECTED NOT DETECTED   Bordetella Parapertussis NOT DETECTED NOT DETECTED   Chlamydophila pneumoniae NOT DETECTED NOT DETECTED   Mycoplasma pneumoniae NOT DETECTED NOT DETECTED  MRSA Next Gen by PCR, Nasal  Result Value Ref Range   MRSA by PCR Next Gen NOT DETECTED NOT DETECTED  Lactic acid, plasma  Result Value Ref Range   Lactic Acid, Venous 2.2 (HH) 0.5 - 1.9 mmol/L  Comprehensive metabolic panel  Result Value Ref Range   Sodium 138 135 - 145 mmol/L   Potassium 4.1 3.5 - 5.1 mmol/L   Chloride 104 98 - 111 mmol/L   CO2 24 22 - 32 mmol/L   Glucose, Bld 116 (H) 70 - 99 mg/dL   BUN 12 6 - 20 mg/dL   Creatinine, Ser 1.30 (H) 0.61 - 1.24 mg/dL   Calcium 8.4 (L) 8.9 - 10.3 mg/dL   Total Protein 8.0 6.5 - 8.1 g/dL   Albumin 3.8 3.5 - 5.0 g/dL   AST 22 15 - 41 U/L   ALT 25 0 - 44 U/L   Alkaline Phosphatase 58 38 - 126 U/L   Total Bilirubin 0.8 0.3 - 1.2 mg/dL   GFR, Estimated >60 >60 mL/min   Anion gap 10 5 - 15  CBC with Differential  Result Value Ref Range   WBC 8.4 4.0 - 10.5 K/uL   RBC 4.85 4.22 - 5.81 MIL/uL   Hemoglobin 14.4 13.0 - 17.0 g/dL   HCT 45.6 39.0 - 52.0 %   MCV 94.0 80.0 - 100.0 fL   MCH 29.7 26.0 - 34.0 pg   MCHC 31.6 30.0 - 36.0 g/dL   RDW 14.8 11.5 - 15.5 %   Platelets 204 150 -  400 K/uL   nRBC 0.0 0.0 - 0.2 %   Neutrophils Relative % 74 %   Neutro Abs 6.2 1.7 - 7.7 K/uL   Lymphocytes Relative 14 %   Lymphs Abs 1.2 0.7 - 4.0 K/uL   Monocytes Relative 10 %   Monocytes Absolute 0.8 0.1 - 1.0 K/uL   Eosinophils Relative 2 %   Eosinophils Absolute 0.1 0.0 - 0.5 K/uL   Basophils Relative 0 %   Basophils Absolute 0.0 0.0 - 0.1  K/uL   Immature Granulocytes 0 %   Abs Immature Granulocytes 0.03 0.00 - 0.07 K/uL  Protime-INR  Result Value Ref Range   Prothrombin Time 16.6 (H) 11.4 - 15.2 seconds   INR 1.4 (H) 0.8 - 1.2  APTT  Result Value Ref Range   aPTT 27 24 - 36 seconds  Urinalysis, Complete w Microscopic Anterior Nasal Swab  Result Value Ref Range   Color, Urine STRAW (A) YELLOW   APPearance CLEAR (A) CLEAR   Specific Gravity, Urine 1.015 1.005 - 1.030   pH 6.0 5.0 - 8.0   Glucose, UA >=500 (A) NEGATIVE mg/dL   Hgb urine dipstick SMALL (A) NEGATIVE   Bilirubin Urine NEGATIVE NEGATIVE   Ketones, ur NEGATIVE NEGATIVE mg/dL   Protein, ur 30 (A) NEGATIVE mg/dL   Nitrite NEGATIVE NEGATIVE   Leukocytes,Ua NEGATIVE NEGATIVE   RBC / HPF 0-5 0 - 5 RBC/hpf   WBC, UA 0-5 0 - 5 WBC/hpf   Bacteria, UA NONE SEEN NONE SEEN   Squamous Epithelial / LPF 0-5 0 - 5  Brain natriuretic peptide  Result Value Ref Range   B Natriuretic Peptide 35.5 0.0 - 100.0 pg/mL  Lactic acid, plasma  Result Value Ref Range   Lactic Acid, Venous 2.1 (HH) 0.5 - 1.9 mmol/L  Lactic acid, plasma  Result Value Ref Range   Lactic Acid, Venous 1.4 0.5 - 1.9 mmol/L  Magnesium  Result Value Ref Range   Magnesium 1.7 1.7 - 2.4 mg/dL  Basic metabolic panel  Result Value Ref Range   Sodium 136 135 - 145 mmol/L   Potassium 4.2 3.5 - 5.1 mmol/L   Chloride 103 98 - 111 mmol/L   CO2 23 22 - 32 mmol/L   Glucose, Bld 118 (H) 70 - 99 mg/dL   BUN 12 6 - 20 mg/dL   Creatinine, Ser 1.60 (H) 0.61 - 1.24 mg/dL   Calcium 8.6 (L) 8.9 - 10.3 mg/dL   GFR, Estimated 52 (L) >60 mL/min   Anion gap 10 5 - 15  Procalcitonin  Result Value Ref Range   Procalcitonin 0.10 ng/mL  APTT  Result Value Ref Range   aPTT 45 (H) 24 - 36 seconds  Heparin level (unfractionated)  Result Value Ref Range   Heparin Unfractionated >1.10 (H) 0.30 - 0.70 IU/mL  Heparin level (unfractionated)  Result Value Ref Range   Heparin Unfractionated >1.10 (H) 0.30 - 0.70 IU/mL   Protime-INR  Result Value Ref Range   Prothrombin Time 17.8 (H) 11.4 - 15.2 seconds   INR 1.5 (H) 0.8 - 1.2  APTT  Result Value Ref Range   aPTT 83 (H) 24 - 36 seconds  APTT  Result Value Ref Range   aPTT 66 (H) 24 - 36 seconds  APTT  Result Value Ref Range   aPTT 58 (H) 24 - 36 seconds  Glucose, capillary  Result Value Ref Range   Glucose-Capillary 130 (H) 70 - 99 mg/dL  Basic metabolic panel  Result Value  Ref Range   Sodium 134 (L) 135 - 145 mmol/L   Potassium 4.0 3.5 - 5.1 mmol/L   Chloride 104 98 - 111 mmol/L   CO2 19 (L) 22 - 32 mmol/L   Glucose, Bld 111 (H) 70 - 99 mg/dL   BUN 13 6 - 20 mg/dL   Creatinine, Ser 1.39 (H) 0.61 - 1.24 mg/dL   Calcium 8.2 (L) 8.9 - 10.3 mg/dL   GFR, Estimated >60 >60 mL/min   Anion gap 11 5 - 15  Phosphorus  Result Value Ref Range   Phosphorus 3.1 2.5 - 4.6 mg/dL  Magnesium  Result Value Ref Range   Magnesium 2.2 1.7 - 2.4 mg/dL  CBC  Result Value Ref Range   WBC 5.4 4.0 - 10.5 K/uL   RBC 4.81 4.22 - 5.81 MIL/uL   Hemoglobin 14.2 13.0 - 17.0 g/dL   HCT 45.6 39.0 - 52.0 %   MCV 94.8 80.0 - 100.0 fL   MCH 29.5 26.0 - 34.0 pg   MCHC 31.1 30.0 - 36.0 g/dL   RDW 15.2 11.5 - 15.5 %   Platelets 166 150 - 400 K/uL   nRBC 0.0 0.0 - 0.2 %  Heparin level (unfractionated)  Result Value Ref Range   Heparin Unfractionated 0.56 0.30 - 0.70 IU/mL  APTT  Result Value Ref Range   aPTT 70 (H) 24 - 36 seconds  APTT  Result Value Ref Range   aPTT 47 (H) 24 - 36 seconds  Heparin level (unfractionated)  Result Value Ref Range   Heparin Unfractionated 0.37 0.30 - 0.70 IU/mL  Glucose, capillary  Result Value Ref Range   Glucose-Capillary 150 (H) 70 - 99 mg/dL  Glucose, capillary  Result Value Ref Range   Glucose-Capillary 148 (H) 70 - 99 mg/dL  Procalcitonin - Baseline  Result Value Ref Range   Procalcitonin 0.95 ng/mL  Heparin level (unfractionated)  Result Value Ref Range   Heparin Unfractionated 0.51 0.30 - 0.70 IU/mL  APTT  Result  Value Ref Range   aPTT 66 (H) 24 - 36 seconds  Glucose, capillary  Result Value Ref Range   Glucose-Capillary 117 (H) 70 - 99 mg/dL  Procalcitonin  Result Value Ref Range   Procalcitonin 0.61 ng/mL  Brain natriuretic peptide  Result Value Ref Range   B Natriuretic Peptide 14.6 0.0 - 100.0 pg/mL  Renal function panel  Result Value Ref Range   Sodium 136 135 - 145 mmol/L   Potassium 3.8 3.5 - 5.1 mmol/L   Chloride 107 98 - 111 mmol/L   CO2 23 22 - 32 mmol/L   Glucose, Bld 122 (H) 70 - 99 mg/dL   BUN 17 6 - 20 mg/dL   Creatinine, Ser 1.36 (H) 0.61 - 1.24 mg/dL   Calcium 8.3 (L) 8.9 - 10.3 mg/dL   Phosphorus 3.5 2.5 - 4.6 mg/dL   Albumin 3.3 (L) 3.5 - 5.0 g/dL   GFR, Estimated >60 >60 mL/min   Anion gap 6 5 - 15  Magnesium  Result Value Ref Range   Magnesium 2.2 1.7 - 2.4 mg/dL  CBC  Result Value Ref Range   WBC 4.8 4.0 - 10.5 K/uL   RBC 4.67 4.22 - 5.81 MIL/uL   Hemoglobin 13.8 13.0 - 17.0 g/dL   HCT 43.3 39.0 - 52.0 %   MCV 92.7 80.0 - 100.0 fL   MCH 29.6 26.0 - 34.0 pg   MCHC 31.9 30.0 - 36.0 g/dL   RDW 14.9 11.5 - 15.5 %  Platelets 180 150 - 400 K/uL   nRBC 0.0 0.0 - 0.2 %  Heparin level (unfractionated)  Result Value Ref Range   Heparin Unfractionated 0.49 0.30 - 0.70 IU/mL  Hemoglobin A1c  Result Value Ref Range   Hgb A1c MFr Bld 6.2 (H) 4.8 - 5.6 %   Mean Plasma Glucose 131.24 mg/dL  Glucose, capillary  Result Value Ref Range   Glucose-Capillary 119 (H) 70 - 99 mg/dL  Glucose, capillary  Result Value Ref Range   Glucose-Capillary 110 (H) 70 - 99 mg/dL  CBG monitoring, ED  Result Value Ref Range   Glucose-Capillary 99 70 - 99 mg/dL  Troponin I (High Sensitivity)  Result Value Ref Range   Troponin I (High Sensitivity) 34 (H) <18 ng/L  Troponin I (High Sensitivity)  Result Value Ref Range   Troponin I (High Sensitivity) 41 (H) <18 ng/L      Assessment & Plan:   Problem List Items Addressed This Visit       Cardiovascular and Mediastinum   Chronic  diastolic CHF (congestive heart failure) ([place]) - Primary     Follow up plan: No follow-ups on file.

## 2022-02-03 ENCOUNTER — Ambulatory Visit (INDEPENDENT_AMBULATORY_CARE_PROVIDER_SITE_OTHER): Payer: No Typology Code available for payment source | Admitting: Nurse Practitioner

## 2022-02-03 ENCOUNTER — Encounter: Payer: Self-pay | Admitting: Nurse Practitioner

## 2022-02-03 VITALS — BP 146/99 | HR 91 | Temp 97.8°F | Wt 364.0 lb

## 2022-02-03 DIAGNOSIS — E7800 Pure hypercholesterolemia, unspecified: Secondary | ICD-10-CM

## 2022-02-03 DIAGNOSIS — E119 Type 2 diabetes mellitus without complications: Secondary | ICD-10-CM

## 2022-02-03 DIAGNOSIS — N1831 Chronic kidney disease, stage 3a: Secondary | ICD-10-CM

## 2022-02-03 DIAGNOSIS — I5032 Chronic diastolic (congestive) heart failure: Secondary | ICD-10-CM | POA: Diagnosis not present

## 2022-02-03 DIAGNOSIS — I10 Essential (primary) hypertension: Secondary | ICD-10-CM

## 2022-02-03 DIAGNOSIS — Z09 Encounter for follow-up examination after completed treatment for conditions other than malignant neoplasm: Secondary | ICD-10-CM | POA: Diagnosis not present

## 2022-02-03 DIAGNOSIS — R0602 Shortness of breath: Secondary | ICD-10-CM

## 2022-02-03 MED ORDER — TIRZEPATIDE 2.5 MG/0.5ML ~~LOC~~ SOAJ: 2.5000 mg | SUBCUTANEOUS | 0 refills | Status: DC

## 2022-02-03 MED ORDER — LOSARTAN POTASSIUM 50 MG PO TABS: 50.0000 mg | ORAL_TABLET | Freq: Every day | ORAL | 1 refills | Status: DC

## 2022-02-03 NOTE — Telephone Encounter (Signed)
Routing to provider  

## 2022-02-03 NOTE — Assessment & Plan Note (Signed)
Chronic.  Controlled.  Continue with current medication regimen.  Labs ordered today.  Return to clinic in 6 months for reevaluation.  Call sooner if concerns arise.  ? ?

## 2022-02-03 NOTE — Assessment & Plan Note (Signed)
Chronic.  Controlled.  Discussed importance of Jardiance. Will start Mounjaro at 2.5mg  will increase monthly.   Labs ordered today.  Return to clinic in 1 month for reevaluation.  Call sooner if concerns arise.

## 2022-02-03 NOTE — Assessment & Plan Note (Signed)
Labs ordered today.  Will make recommendations based on lab results. ?

## 2022-02-03 NOTE — Assessment & Plan Note (Signed)
Chronic. EF 60-65%. Does not have appointment with Cardiology till August.  Discussed the importance of moving up appointment.  Restart Losartan.  Continue with Jardiance.  Follow up in 1 month.

## 2022-02-03 NOTE — Telephone Encounter (Signed)
Copied from CRM [id number]. Topic: General - Other >> Feb 03, 2022  1:44 PM Ryan [name] A wrote: Reason for CRM: The patient has called to request that the information of both of their hospital stays can be included on one FMLA form for the patient   Please contact further when possible

## 2022-02-03 NOTE — Assessment & Plan Note (Signed)
Chronic. Not well controlled.  Did not restart Losartan 5 days after discharged.  Labs ordered today.  Follow up in 1 month for reevaluation.

## 2022-02-04 LAB — HEMOGLOBIN A1C
Est. average glucose Bld gHb Est-mCnc: 137 mg/dL
Hgb A1c MFr Bld: 6.4 % — ABNORMAL HIGH (ref 4.8–5.6)

## 2022-02-04 LAB — CBC WITH DIFFERENTIAL/PLATELET
Basophils Absolute: 0 10*3/uL (ref 0.0–0.2)
Basos: 1 %
EOS (ABSOLUTE): 0.3 10*3/uL (ref 0.0–0.4)
Eos: 8 %
Hematocrit: 45.1 % (ref 37.5–51.0)
Hemoglobin: 14.8 g/dL (ref 13.0–17.7)
Immature Grans (Abs): 0 10*3/uL (ref 0.0–0.1)
Immature Granulocytes: 1 %
Lymphocytes Absolute: 1.7 10*3/uL (ref 0.7–3.1)
Lymphs: 43 %
MCH: 29.9 pg (ref 26.6–33.0)
MCHC: 32.8 g/dL (ref 31.5–35.7)
MCV: 91 fL (ref 79–97)
Monocytes Absolute: 0.5 10*3/uL (ref 0.1–0.9)
Monocytes: 12 %
Neutrophils Absolute: 1.4 10*3/uL (ref 1.4–7.0)
Neutrophils: 35 %
Platelets: 293 10*3/uL (ref 150–450)
RBC: 4.95 x10E6/uL (ref 4.14–5.80)
RDW: 13.8 % (ref 11.6–15.4)
WBC: 4 10*3/uL (ref 3.4–10.8)

## 2022-02-04 LAB — COMPREHENSIVE METABOLIC PANEL
ALT: 27 IU/L (ref 0–44)
AST: 21 IU/L (ref 0–40)
Albumin/Globulin Ratio: 1.1 — ABNORMAL LOW (ref 1.2–2.2)
Albumin: 4.2 g/dL (ref 3.8–4.9)
Alkaline Phosphatase: 63 IU/L (ref 44–121)
BUN/Creatinine Ratio: 9 (ref 9–20)
BUN: 12 mg/dL (ref 6–24)
Bilirubin Total: 0.4 mg/dL (ref 0.0–1.2)
CO2: 18 mmol/L — ABNORMAL LOW (ref 20–29)
Calcium: 9.3 mg/dL (ref 8.7–10.2)
Chloride: 105 mmol/L (ref 96–106)
Creatinine, Ser: 1.36 mg/dL — ABNORMAL HIGH (ref 0.76–1.27)
Globulin, Total: 3.8 g/dL (ref 1.5–4.5)
Glucose: 116 mg/dL — ABNORMAL HIGH (ref 70–99)
Potassium: 4.4 mmol/L (ref 3.5–5.2)
Sodium: 142 mmol/L (ref 134–144)
Total Protein: 8 g/dL (ref 6.0–8.5)
eGFR: 63 mL/min/{1.73_m2} (ref >59)

## 2022-02-04 LAB — MAGNESIUM: Magnesium: 1.8 mg/dL (ref 1.6–2.3)

## 2022-02-04 NOTE — Progress Notes (Signed)
Hi Ryan [name].  It was good to see you yesterday.  Your lab work looks good.  Your kidney function remains stable.  A1c looks great at 6.4.  Your magnesium is also within normal range.  No other concerns at this time.  Follow up as discussed.

## 2022-02-08 NOTE — Telephone Encounter (Signed)
FMLA filled out for patient.

## 2022-02-09 ENCOUNTER — Telehealth: Payer: Self-pay

## 2022-02-09 ENCOUNTER — Encounter: Payer: Self-pay | Admitting: Oncology

## 2022-02-09 ENCOUNTER — Inpatient Hospital Stay: Payer: No Typology Code available for payment source

## 2022-02-09 ENCOUNTER — Inpatient Hospital Stay: Payer: No Typology Code available for payment source | Attending: Oncology | Admitting: Oncology

## 2022-02-09 VITALS — BP 142/99 | HR 105 | Temp 97.9°F | Resp 18 | Ht 67.0 in | Wt 357.6 lb

## 2022-02-09 DIAGNOSIS — I82503 Chronic embolism and thrombosis of unspecified deep veins of lower extremity, bilateral: Secondary | ICD-10-CM | POA: Insufficient documentation

## 2022-02-09 DIAGNOSIS — Z7901 Long term (current) use of anticoagulants: Secondary | ICD-10-CM | POA: Diagnosis not present

## 2022-02-09 DIAGNOSIS — Z6841 Body Mass Index (BMI) 40.0 and over, adult: Secondary | ICD-10-CM | POA: Insufficient documentation

## 2022-02-09 DIAGNOSIS — K760 Fatty (change of) liver, not elsewhere classified: Secondary | ICD-10-CM | POA: Insufficient documentation

## 2022-02-09 DIAGNOSIS — E119 Type 2 diabetes mellitus without complications: Secondary | ICD-10-CM | POA: Insufficient documentation

## 2022-02-09 DIAGNOSIS — Z803 Family history of malignant neoplasm of breast: Secondary | ICD-10-CM | POA: Insufficient documentation

## 2022-02-09 DIAGNOSIS — I110 Hypertensive heart disease with heart failure: Secondary | ICD-10-CM | POA: Diagnosis not present

## 2022-02-09 DIAGNOSIS — Z8042 Family history of malignant neoplasm of prostate: Secondary | ICD-10-CM | POA: Diagnosis not present

## 2022-02-09 DIAGNOSIS — I509 Heart failure, unspecified: Secondary | ICD-10-CM | POA: Diagnosis not present

## 2022-02-09 DIAGNOSIS — R591 Generalized enlarged lymph nodes: Secondary | ICD-10-CM | POA: Insufficient documentation

## 2022-02-09 DIAGNOSIS — I825Z3 Chronic embolism and thrombosis of unspecified deep veins of distal lower extremity, bilateral: Secondary | ICD-10-CM

## 2022-02-09 DIAGNOSIS — I82509 Chronic embolism and thrombosis of unspecified deep veins of unspecified lower extremity: Secondary | ICD-10-CM | POA: Insufficient documentation

## 2022-02-09 NOTE — Progress Notes (Signed)
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 Currently on Xarelto 20 mg daily. Previously hypercoagulable work-up was negative, factor V Leiden mutation not checked. Otherwise no evidence of inherited thrombophilia His risk factors including morbid obesity, sedentary lifestyle etc.  Morbid obesity with BMI of 50.0-59.9, adult ([place]) Recommend healthy diet and exercise.  Lymphadenopathy History of external iliac and bilateral inguinal lymph node since 2016. There was plan for ultrasound-guided biopsy of inguinal lymph node the patient never had it done. Recommend repeat CT abdomen pelvis without contrast for further evaluation.   Orders Placed This Encounter  Procedures   CT Abdomen Pelvis Wo Contrast    Standing Status:   Future    Standing Expiration Date:   02/09/2023    Order Specific Question:   Preferred imaging location?    Answer:   [facility]    Order Specific Question:   Is Oral Contrast requested for this exam?    Answer:   Yes, Per Radiology protocol   Factor 5 leiden    Standing Status:   Future    Standing Expiration Date:   02/10/2023   Follow-up to be determined.   All questions were answered. The patient knows to call the clinic with any problems, questions or concerns. No barriers to learning was detected.  Earlie Server, MD 02/09/2022   CHIEF COMPLAINTS/PURPOSE OF CONSULTATION:  Recurrent DVT/PE  HISTORY OF PRESENTING ILLNESS:  Ryan [name] 51 y.o. male presents to establish care for evaluation of recurrent DVT/PE I have reviewed his chart and materials related to his cancer extensively and collaborated history with the patient.  Summary of hematology history is as follows:  #Extensive history of recurrent DVT/PE, history of IVC filter.  Strong family history of VTE. Reports that he has had first lower extremity DVT diagnosed when he was 51 years old, which was treated with Coumadin.  Developed recurrent DVT when he was off Coumadin and he was started on Coumadin for chronic anticoagulation.   He has had extensive work-up done at [facility] hematology clinic.  Initially was seen in May 2001, and most recently seen at [facility] hematology clinic on 06/27/2017.  Patient has had multiple recurrent VTE events and not able to recall all the details precisely.  He has had IVC filter placed. History of  noncompliance of his Coumadin, and diet, and not compliant with follow-up for INR checks, not able to achieve stable therapeutic INR.  Patient was switched to Xarelto in May 2021.  Patient was seen by my colleague Dr. Janese Banks in 2018 during his hospitalization. 12/23/2016, hypercoagulable work-up including negative lupus anticoagulant, beta glycoprotein antibodies, cardiolipin antibodies, negative prothrombin gene mutation.  I do not see factor V Leiden mutation was checked.    02/23/2017, CT abdomen pelvis without contrast showed mildly enlarged external iliac and bilateral inguinal lymph nodes.  12/25/2014, PET scan showed external iliac and inguinal hypermetabolic adenopathy.  Dr. Presented the case in the tumor board and consensus recommendation was to proceed with ultrasound-guided biopsy of inguinal lymph node. he has lost follow-up with Dr. Janese Banks and never had lymph node biopsy.  Patient reports that he was on Eliquis in the past for about a year and a half, not  working and switched to [employer] for 2 years.  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  12/31/2021, CT chest angiogram showed no acute or occlusive pulmonary artery embolism.  Small right pulmonary  artery branch chronic PE/current significant decreased since the study of 2021.  Fatty liver. 01/01/22, bilateral lower extremity ultrasound showed symmetric nonocclusive thrombus in both legs, likely subacute to chronic in nature.   Today patient reports feeling well at baseline.  Chronic shortness of breath with exertion, not worse.  Bilateral lower extremity chronic edema, not worse.   MEDICAL HISTORY:  Past Medical History:  Diagnosis Date   CHF (congestive heart failure) (HCC)    Diabetes mellitus without complication (HCC)    per pt-pre diabetic-Dr Dario Guardian stated said pt   DVT (deep venous thrombosis) (HCC)    Hepatic steatosis    Hypertension    Morbid obesity with BMI of 50.0-59.9, adult (HCC)    Peripheral vascular disease (HCC)    Presence of IVC filter    Ventricular trigeminy    Weakness of both lower limbs     SURGICAL HISTORY: Past Surgical History:  Procedure Laterality Date   CARDIAC ELECTROPHYSIOLOGY STUDY AND ABLATION  10/2020   PERIPHERAL VASCULAR CATHETERIZATION N/A 01/10/2015   Procedure: Dialysis/Perma Catheter Insertion;  Surgeon: Renford Dills, MD;  Location: [facility] INVASIVE CV LAB;  Service: Cardiovascular;  Laterality: N/A;   PERIPHERAL VASCULAR CATHETERIZATION N/A 02/24/2015   Procedure: Dialysis/Perma Catheter Removal;  Surgeon: Annice Needy, MD;  Location: [facility] INVASIVE CV LAB;  Service: Cardiovascular;  Laterality: N/A;    SOCIAL HISTORY: Social History   Socioeconomic History   Marital status: Married    Spouse name: Not on file   Number of children: Not on file   Years of education: Not on file   Highest education level: Not on file  Occupational History   Not on file  Tobacco Use   Smoking status: Never   Smokeless tobacco: Never  Vaping Use   Vaping Use: Never used  Substance and Sexual Activity   Alcohol use: No   Drug use: No   Sexual activity: Yes  Other Topics Concern   Not on file  Social History Narrative   Not on file    Social Determinants of Health   Financial Resource Strain: Not on file  Food Insecurity: Not on file  Transportation Needs: Not on file  Physical Activity: Not on file  Stress: Not on file  Social Connections: Not on file  Intimate Partner Violence: Not on file    FAMILY HISTORY: Family History  Problem Relation Age of Onset   Hypertension Mother    Cancer Mother    Breast cancer Mother    Prostate cancer Father    Hypertension Father    Diabetes Father    Pulmonary embolism Paternal Grandfather    Pulmonary embolism Paternal Uncle     ALLERGIES:  is allergic to iodinated contrast media, metrizamide, clindamycin/lincomycin, lincomycin, and sulfa antibiotics.  MEDICATIONS:  Current Outpatient Medications  Medication Sig Dispense Refill   empagliflozin (JARDIANCE) 25 MG TABS tablet Take 1 tablet (25 mg total) by mouth daily before breakfast. 90 tablet 1   furosemide (LASIX) 40 MG tablet Take 1 tablet (40 mg total) by mouth daily. 90 tablet 0   losartan (COZAAR) 50 MG tablet Take 1 tablet (50 mg total) by mouth daily. 90 tablet 1  metoprolol succinate (TOPROL-XL) 25 MG 24 hr tablet Take 1 tablet by mouth daily.     XARELTO 20 MG TABS tablet Take 20 mg by mouth daily.     tirzepatide [facility]) 2.5 MG/0.5ML Pen Inject 2.5 mg into the skin once a week. (Patient not taking: Reported on 02/09/2022) 2 mL 0   No current facility-administered medications for this visit.    Review of Systems  Constitutional:  Positive for fatigue. Negative for appetite change, chills, fever and unexpected weight change.  HENT:   Negative for hearing loss and voice change.   Eyes:  Negative for eye problems and icterus.  Respiratory:  Positive for shortness of breath. Negative for chest tightness and cough.   Cardiovascular:  Positive for leg swelling. Negative for chest pain.  Gastrointestinal:  Negative for abdominal distention and abdominal pain.  Endocrine: Negative for hot flashes.   Genitourinary:  Negative for difficulty urinating, dysuria and frequency.   Musculoskeletal:  Negative for arthralgias.  Skin:  Negative for itching and rash.  Neurological:  Negative for light-headedness and numbness.  Hematological:  Negative for adenopathy. Does not bruise/bleed easily.  Psychiatric/Behavioral:  Negative for confusion.      PHYSICAL EXAMINATION:  Vitals:   02/09/22 0928  BP: (!) 142/99  Pulse: (!) 105  Resp: 18  Temp: 97.9 F (36.6 C)  SpO2: 95%   Filed Weights   02/09/22 0928  Weight: (!) 357 lb 9.6 oz (162.2 kg)    Physical Exam Constitutional:      General: He is not in acute distress.    Appearance: He is obese. He is not diaphoretic.  HENT:     Head: Normocephalic and atraumatic.     Nose: Nose normal.     Mouth/Throat:     Pharynx: No oropharyngeal exudate.  Eyes:     General: No scleral icterus.    Pupils: Pupils are equal, round, and reactive to light.  Cardiovascular:     Rate and Rhythm: Normal rate and regular rhythm.     Heart sounds: No murmur heard. Pulmonary:     Effort: Pulmonary effort is normal. No respiratory distress.  Chest:     Chest wall: No tenderness.  Abdominal:     General: There is no distension.     Palpations: Abdomen is soft.  Musculoskeletal:        General: Normal range of motion.     Cervical back: Normal range of motion and neck supple.  Skin:    General: Skin is warm and dry.     Comments: Chronic bilateral lower extremity hyperpigmentation/edema  Neurological:     Mental Status: He is alert and oriented to person, place, and time.     Cranial Nerves: No cranial nerve deficit.     Motor: No abnormal muscle tone.     Coordination: Coordination normal.  Psychiatric:        Mood and Affect: Mood and affect normal.      LABORATORY DATA:  I have reviewed the data as listed Lab Results  Component Value Date   WBC 4.0 02/03/2022   HGB 14.8 02/03/2022   HCT 45.1 02/03/2022   MCV 91 02/03/2022    PLT 293 02/03/2022   Recent Labs    01/02/22 0500 01/04/22 0521 01/25/22 2145 01/26/22 0620 01/27/22 0515 01/28/22 0221 02/03/22 0857  NA 135   < > 138 136 134* 136 142  K 4.0   < > 4.1 4.2 4.0 3.8 4.4  CL 105   < >  104 103 104 107 105  CO2 23   < > 24 23 19* 23 18*  GLUCOSE 105*   < > 116* 118* 111* 122* 116*  BUN 15   < > 12 12 13 17 12   CREATININE 1.41*   < > 1.30* 1.60* 1.39* 1.36* 1.36*  CALCIUM 8.6*   < > 8.4* 8.6* 8.2* 8.3* 9.3  GFRNONAA 60*   < > >60 52* >60 >60  --   PROT 7.5  --  8.0  --   --   --  8.0  ALBUMIN 3.4*  --  3.8  --   --  3.3* 4.2  AST 21  --  22  --   --   --  21  ALT 25  --  25  --   --   --  27  ALKPHOS 45  --  58  --   --   --  63  BILITOT 0.9  --  0.8  --   --   --  0.4   < > = values in this interval not displayed.    RADIOGRAPHIC STUDIES: I have personally reviewed the radiological images as listed and agreed with the findings in the report. DG Chest Port 1 View  Result Date: 01/25/2022 CLINICAL DATA:  Cough, short of breath, chest pain EXAM: PORTABLE CHEST 1 VIEW COMPARISON:  12/31/2021 FINDINGS: The heart size and mediastinal contours are within normal limits. Both lungs are clear. The visualized skeletal structures are unremarkable. IMPRESSION: No active disease. Electronically Signed   By: Randa Ngo M.D.   On: 01/25/2022 21:48

## 2022-02-09 NOTE — Assessment & Plan Note (Signed)
Recommend healthy diet and exercise. 

## 2022-02-09 NOTE — Telephone Encounter (Signed)
Patient has been notified to pick up [facility] paperwork.

## 2022-02-09 NOTE — Assessment & Plan Note (Signed)
History of external iliac and bilateral inguinal lymph node since 2016. There was plan for ultrasound-guided biopsy of inguinal lymph node the patient never had it done. Recommend repeat CT abdomen pelvis without contrast for further evaluation.

## 2022-02-09 NOTE — Telephone Encounter (Signed)
PA for Ryan [name] has been initiated and approved until 01/2023. Pt has been notified.

## 2022-02-09 NOTE — Assessment & Plan Note (Signed)
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 Currently on Xarelto 20 mg daily. Previously hypercoagulable work-up was negative, factor V Leiden mutation not checked. Otherwise no evidence of inherited thrombophilia His risk factors including morbid obesity, sedentary lifestyle etc.

## 2022-02-09 NOTE — Progress Notes (Signed)
Patient here to establish care for VTE.

## 2022-02-10 ENCOUNTER — Inpatient Hospital Stay: Payer: No Typology Code available for payment source | Admitting: Nurse Practitioner

## 2022-02-10 ENCOUNTER — Telehealth: Payer: Self-pay

## 2022-02-10 ENCOUNTER — Telehealth: Payer: Self-pay | Admitting: Oncology

## 2022-02-10 NOTE — Telephone Encounter (Signed)
My chart message sent to pt today.

## 2022-02-10 NOTE — Telephone Encounter (Signed)
LVM for pt with Lab encounter appt information

## 2022-02-10 NOTE — Telephone Encounter (Signed)
-----   Message from Earlie Server, MD sent at 02/09/2022 10:39 PM EDT ----- Please let patient know that I reviewed his previous chart.  There is a test that was not done in the past and I recommend patient to get that tested.  Please set up lab encounter.

## 2022-02-10 NOTE — Telephone Encounter (Signed)
Paperwork has been filled out and faxed to [facility].

## 2022-02-15 ENCOUNTER — Telehealth: Payer: Self-pay | Admitting: Nurse Practitioner

## 2022-02-15 NOTE — Telephone Encounter (Signed)
Copied from CRM [id number]. Topic: General - Other >> Feb 15, 2022  8:38 AM Everette C wrote: Reason for CRM: The patient would like their short term disability paperwork faxed to the number provided on the header   The patient was coming to the office but is having transportation issues   Please contact further if needed

## 2022-02-15 NOTE — Telephone Encounter (Signed)
Paper work has been faxed over to [phone]

## 2022-02-17 ENCOUNTER — Inpatient Hospital Stay: Payer: No Typology Code available for payment source

## 2022-02-17 DIAGNOSIS — I82503 Chronic embolism and thrombosis of unspecified deep veins of lower extremity, bilateral: Secondary | ICD-10-CM | POA: Diagnosis not present

## 2022-02-17 DIAGNOSIS — I825Z3 Chronic embolism and thrombosis of unspecified deep veins of distal lower extremity, bilateral: Secondary | ICD-10-CM

## 2022-02-20 ENCOUNTER — Emergency Department: Payer: No Typology Code available for payment source

## 2022-02-20 ENCOUNTER — Other Ambulatory Visit: Payer: Self-pay

## 2022-02-20 ENCOUNTER — Inpatient Hospital Stay
Admission: EM | Admit: 2022-02-20 | Discharge: 2022-02-23 | DRG: 189 | Disposition: A | Discharge status: Home / Self Care | Payer: No Typology Code available for payment source | Attending: Osteopathic Medicine | Admitting: Osteopathic Medicine

## 2022-02-20 ENCOUNTER — Encounter: Payer: Self-pay | Admitting: Emergency Medicine

## 2022-02-20 DIAGNOSIS — N183 Chronic kidney disease, stage 3 unspecified: Secondary | ICD-10-CM | POA: Diagnosis present

## 2022-02-20 DIAGNOSIS — Z882 Allergy status to sulfonamides status: Secondary | ICD-10-CM | POA: Diagnosis not present

## 2022-02-20 DIAGNOSIS — E1151 Type 2 diabetes mellitus with diabetic peripheral angiopathy without gangrene: Secondary | ICD-10-CM | POA: Diagnosis present

## 2022-02-20 DIAGNOSIS — Z803 Family history of malignant neoplasm of breast: Secondary | ICD-10-CM | POA: Diagnosis not present

## 2022-02-20 DIAGNOSIS — Z8042 Family history of malignant neoplasm of prostate: Secondary | ICD-10-CM | POA: Diagnosis not present

## 2022-02-20 DIAGNOSIS — Z8249 Family history of ischemic heart disease and other diseases of the circulatory system: Secondary | ICD-10-CM

## 2022-02-20 DIAGNOSIS — Z95828 Presence of other vascular implants and grafts: Secondary | ICD-10-CM | POA: Diagnosis not present

## 2022-02-20 DIAGNOSIS — I5032 Chronic diastolic (congestive) heart failure: Secondary | ICD-10-CM | POA: Diagnosis present

## 2022-02-20 DIAGNOSIS — J9601 Acute respiratory failure with hypoxia: Principal | ICD-10-CM | POA: Diagnosis present

## 2022-02-20 DIAGNOSIS — A419 Sepsis, unspecified organism: Principal | ICD-10-CM

## 2022-02-20 DIAGNOSIS — J159 Unspecified bacterial pneumonia: Secondary | ICD-10-CM | POA: Insufficient documentation

## 2022-02-20 DIAGNOSIS — I4891 Unspecified atrial fibrillation: Secondary | ICD-10-CM | POA: Diagnosis present

## 2022-02-20 DIAGNOSIS — I272 Pulmonary hypertension, unspecified: Secondary | ICD-10-CM | POA: Diagnosis not present

## 2022-02-20 DIAGNOSIS — Z7901 Long term (current) use of anticoagulants: Secondary | ICD-10-CM | POA: Diagnosis not present

## 2022-02-20 DIAGNOSIS — J189 Pneumonia, unspecified organism: Secondary | ICD-10-CM

## 2022-02-20 DIAGNOSIS — Z6841 Body Mass Index (BMI) 40.0 and over, adult: Secondary | ICD-10-CM

## 2022-02-20 DIAGNOSIS — Z91041 Radiographic dye allergy status: Secondary | ICD-10-CM

## 2022-02-20 DIAGNOSIS — E785 Hyperlipidemia, unspecified: Secondary | ICD-10-CM | POA: Diagnosis present

## 2022-02-20 DIAGNOSIS — I825Z3 Chronic embolism and thrombosis of unspecified deep veins of distal lower extremity, bilateral: Secondary | ICD-10-CM | POA: Diagnosis present

## 2022-02-20 DIAGNOSIS — I872 Venous insufficiency (chronic) (peripheral): Secondary | ICD-10-CM | POA: Diagnosis present

## 2022-02-20 DIAGNOSIS — E1122 Type 2 diabetes mellitus with diabetic chronic kidney disease: Secondary | ICD-10-CM | POA: Diagnosis present

## 2022-02-20 DIAGNOSIS — Z888 Allergy status to other drugs, medicaments and biological substances status: Secondary | ICD-10-CM | POA: Diagnosis not present

## 2022-02-20 DIAGNOSIS — N1831 Chronic kidney disease, stage 3a: Secondary | ICD-10-CM | POA: Diagnosis present

## 2022-02-20 DIAGNOSIS — Z79899 Other long term (current) drug therapy: Secondary | ICD-10-CM | POA: Diagnosis not present

## 2022-02-20 DIAGNOSIS — I130 Hypertensive heart and chronic kidney disease with heart failure and stage 1 through stage 4 chronic kidney disease, or unspecified chronic kidney disease: Secondary | ICD-10-CM | POA: Diagnosis present

## 2022-02-20 DIAGNOSIS — Z881 Allergy status to other antibiotic agents status: Secondary | ICD-10-CM

## 2022-02-20 DIAGNOSIS — I10 Essential (primary) hypertension: Secondary | ICD-10-CM | POA: Diagnosis present

## 2022-02-20 DIAGNOSIS — E119 Type 2 diabetes mellitus without complications: Secondary | ICD-10-CM

## 2022-02-20 DIAGNOSIS — Z833 Family history of diabetes mellitus: Secondary | ICD-10-CM | POA: Diagnosis not present

## 2022-02-20 DIAGNOSIS — Z20822 Contact with and (suspected) exposure to covid-19: Secondary | ICD-10-CM | POA: Diagnosis present

## 2022-02-20 DIAGNOSIS — R0602 Shortness of breath: Secondary | ICD-10-CM | POA: Diagnosis present

## 2022-02-20 DIAGNOSIS — I82509 Chronic embolism and thrombosis of unspecified deep veins of unspecified lower extremity: Secondary | ICD-10-CM | POA: Diagnosis present

## 2022-02-20 LAB — COMPREHENSIVE METABOLIC PANEL
ALT: 24 U/L (ref 0–44)
AST: 20 U/L (ref 15–41)
Albumin: 4.1 g/dL (ref 3.5–5.0)
Alkaline Phosphatase: 59 U/L (ref 38–126)
Anion gap: 9 (ref 5–15)
BUN: 17 mg/dL (ref 6–20)
CO2: 25 mmol/L (ref 22–32)
Calcium: 9.3 mg/dL (ref 8.9–10.3)
Chloride: 102 mmol/L (ref 98–111)
Creatinine, Ser: 1.67 mg/dL — ABNORMAL HIGH (ref 0.61–1.24)
GFR, Estimated: 49 mL/min — ABNORMAL LOW (ref >60)
Glucose, Bld: 123 mg/dL — ABNORMAL HIGH (ref 70–99)
Potassium: 3.9 mmol/L (ref 3.5–5.1)
Sodium: 136 mmol/L (ref 135–145)
Total Bilirubin: 1.2 mg/dL (ref 0.3–1.2)
Total Protein: 8.7 g/dL — ABNORMAL HIGH (ref 6.5–8.1)

## 2022-02-20 LAB — CBC WITH DIFFERENTIAL/PLATELET
Abs Immature Granulocytes: 0.02 10*3/uL (ref 0.00–0.07)
Basophils Absolute: 0 10*3/uL (ref 0.0–0.1)
Basophils Relative: 0 %
Eosinophils Absolute: 0 10*3/uL (ref 0.0–0.5)
Eosinophils Relative: 0 %
HCT: 50.3 % (ref 39.0–52.0)
Hemoglobin: 15.9 g/dL (ref 13.0–17.0)
Immature Granulocytes: 0 %
Lymphocytes Relative: 17 %
Lymphs Abs: 1.5 10*3/uL (ref 0.7–4.0)
MCH: 29.2 pg (ref 26.0–34.0)
MCHC: 31.6 g/dL (ref 30.0–36.0)
MCV: 92.5 fL (ref 80.0–100.0)
Monocytes Absolute: 0.7 10*3/uL (ref 0.1–1.0)
Monocytes Relative: 8 %
Neutro Abs: 6.5 10*3/uL (ref 1.7–7.7)
Neutrophils Relative %: 75 %
Platelets: 270 10*3/uL (ref 150–400)
RBC: 5.44 MIL/uL (ref 4.22–5.81)
RDW: 14.6 % (ref 11.5–15.5)
WBC: 8.8 10*3/uL (ref 4.0–10.5)
nRBC: 0 % (ref 0.0–0.2)

## 2022-02-20 LAB — URINALYSIS, ROUTINE W REFLEX MICROSCOPIC
Bacteria, UA: NONE SEEN
Bilirubin Urine: NEGATIVE
Glucose, UA: NEGATIVE mg/dL
Ketones, ur: NEGATIVE mg/dL
Leukocytes,Ua: NEGATIVE
Nitrite: NEGATIVE
Protein, ur: 100 mg/dL — AB
Specific Gravity, Urine: 1.021 (ref 1.005–1.030)
pH: 5 (ref 5.0–8.0)

## 2022-02-20 LAB — RESP PANEL BY RT-PCR (FLU A&B, COVID) ARPGX2
Influenza A by PCR: NEGATIVE
Influenza B by PCR: NEGATIVE
SARS Coronavirus 2 by RT PCR: NEGATIVE

## 2022-02-20 LAB — GLUCOSE, CAPILLARY: Glucose-Capillary: 129 mg/dL — ABNORMAL HIGH (ref 70–99)

## 2022-02-20 LAB — PROTIME-INR
INR: 1.5 — ABNORMAL HIGH (ref 0.8–1.2)
Prothrombin Time: 18.2 seconds — ABNORMAL HIGH (ref 11.4–15.2)

## 2022-02-20 LAB — PROCALCITONIN: Procalcitonin: 0.14 ng/mL

## 2022-02-20 LAB — HIV ANTIBODY (ROUTINE TESTING W REFLEX): HIV Screen 4th Generation wRfx: NONREACTIVE

## 2022-02-20 LAB — LACTIC ACID, PLASMA: Lactic Acid, Venous: 1.6 mmol/L (ref 0.5–1.9)

## 2022-02-20 LAB — BRAIN NATRIURETIC PEPTIDE: B Natriuretic Peptide: 94.4 pg/mL (ref 0.0–100.0)

## 2022-02-20 LAB — CBG MONITORING, ED: Glucose-Capillary: 131 mg/dL — ABNORMAL HIGH (ref 70–99)

## 2022-02-20 MED ORDER — ACETAMINOPHEN 500 MG PO TABS
1000.0000 mg | ORAL_TABLET | Freq: Once | ORAL | Status: AC
  Administered 2022-02-20: 1000 mg via ORAL
  Filled 2022-02-20: qty 2

## 2022-02-20 MED ORDER — VANCOMYCIN HCL 2000 MG/400ML IV SOLN
2000.0000 mg | Freq: Once | INTRAVENOUS | Status: AC
  Administered 2022-02-20: 2000 mg via INTRAVENOUS
  Filled 2022-02-20: qty 400

## 2022-02-20 MED ORDER — EMPAGLIFLOZIN 25 MG PO TABS
25.0000 mg | ORAL_TABLET | Freq: Every day | ORAL | Status: DC
  Administered 2022-02-21 – 2022-02-23 (×3): 25 mg via ORAL
  Filled 2022-02-20 (×4): qty 1

## 2022-02-20 MED ORDER — INSULIN ASPART 100 UNIT/ML IJ SOLN: 0.0000 [IU] | Freq: Every day | INTRAMUSCULAR | Status: DC

## 2022-02-20 MED ORDER — FUROSEMIDE 40 MG PO TABS
40.0000 mg | ORAL_TABLET | Freq: Every day | ORAL | Status: DC
  Administered 2022-02-20: 40 mg via ORAL
  Filled 2022-02-20: qty 1

## 2022-02-20 MED ORDER — SODIUM CHLORIDE 0.9 % IV SOLN
500.0000 mg | Freq: Once | INTRAVENOUS | Status: AC
  Administered 2022-02-20: 500 mg via INTRAVENOUS
  Filled 2022-02-20: qty 5

## 2022-02-20 MED ORDER — ACETAMINOPHEN 500 MG PO TABS
1000.0000 mg | ORAL_TABLET | Freq: Four times a day (QID) | ORAL | Status: DC | PRN
  Administered 2022-02-20 – 2022-02-21 (×2): 1000 mg via ORAL
  Filled 2022-02-20 (×2): qty 2

## 2022-02-20 MED ORDER — RIVAROXABAN 20 MG PO TABS
20.0000 mg | ORAL_TABLET | Freq: Every day | ORAL | Status: DC
  Administered 2022-02-20 – 2022-02-22 (×3): 20 mg via ORAL
  Filled 2022-02-20 (×3): qty 1

## 2022-02-20 MED ORDER — SODIUM CHLORIDE 0.9 % IV SOLN: INTRAVENOUS | Status: AC

## 2022-02-20 MED ORDER — SODIUM CHLORIDE 0.9 % IV SOLN
2.0000 g | Freq: Three times a day (TID) | INTRAVENOUS | Status: DC
  Administered 2022-02-20 – 2022-02-22 (×5): 2 g via INTRAVENOUS
  Filled 2022-02-20 (×5): qty 12.5
  Filled 2022-02-20: qty 2
  Filled 2022-02-20: qty 12.5

## 2022-02-20 MED ORDER — SODIUM CHLORIDE 0.9 % IV SOLN
2.0000 g | Freq: Once | INTRAVENOUS | Status: AC
  Administered 2022-02-20: 2 g via INTRAVENOUS
  Filled 2022-02-20: qty 12.5

## 2022-02-20 MED ORDER — LACTATED RINGERS IV BOLUS
1000.0000 mL | Freq: Once | INTRAVENOUS | Status: AC
  Administered 2022-02-20: 1000 mL via INTRAVENOUS

## 2022-02-20 MED ORDER — SODIUM CHLORIDE 0.9 % IV SOLN: 500.0000 mg | INTRAVENOUS | Status: DC

## 2022-02-20 MED ORDER — SODIUM CHLORIDE 0.9 % IV SOLN
2.0000 g | INTRAVENOUS | Status: DC
  Administered 2022-02-20: 2 g via INTRAVENOUS
  Filled 2022-02-20: qty 20

## 2022-02-20 MED ORDER — LOSARTAN POTASSIUM 50 MG PO TABS
50.0000 mg | ORAL_TABLET | Freq: Every day | ORAL | Status: DC
  Administered 2022-02-20: 50 mg via ORAL
  Filled 2022-02-20: qty 1

## 2022-02-20 MED ORDER — INSULIN ASPART 100 UNIT/ML IJ SOLN
0.0000 [IU] | Freq: Three times a day (TID) | INTRAMUSCULAR | Status: DC
  Administered 2022-02-20: 3 [IU] via SUBCUTANEOUS
  Administered 2022-02-21 (×2): 4 [IU] via SUBCUTANEOUS
  Administered 2022-02-22 – 2022-02-23 (×3): 3 [IU] via SUBCUTANEOUS
  Filled 2022-02-20 (×6): qty 1

## 2022-02-20 MED ORDER — METOPROLOL SUCCINATE ER 25 MG PO TB24
25.0000 mg | ORAL_TABLET | Freq: Every day | ORAL | Status: DC
  Administered 2022-02-20 – 2022-02-23 (×4): 25 mg via ORAL
  Filled 2022-02-20 (×4): qty 1

## 2022-02-20 NOTE — H&P (Addendum)
History and Physical    DEVONTEZ ASCHER WUJ:[id number] DOB: July 24, 1971 DOA: 02/20/2022  PCP: [name], NP  Patient coming from: Home   Chief Complaint: SOB, cough  HPI: Ryan [name] is a 51 y.o. male with medical history significant of HTN, CKD 3a, HLD, HFpEF (chronic), recurrent and chronic VTE, DM2 who presents with recurrent pneumonia.  He reports feeling well until about 6pm the day prior to admission.  He developed a fever, which was followed by cough and SOB.  He notes chills, nausea and sour taste in his mouth.  He denies any chest pain.  He has had clear sputum production, no hemoptysis.  He has no swelling, no rash, no sick contacts.  This is now his 3rd occurrence of a similar presentation, most recently in May.  He has a long history of DVT and PE, with at least 20 DVT in both legs since his 21s.  He has not been diagnosed with a disorder, but is following with a hematologist.  He has not missed any doses of Xarelto.  He notes, in the past, that he feels better after the antibiotics.  He was noted to have a pulse ox of 85% which improved with 5L [place] oxygen.  He also had a fever and increased HR here in the ED.   ED Course: In the ED, he was noted to be hypoxic and febrile.  He was started on broad spectrum Abx given his recent hospitalization.  MRSA nares are negative from previous admission.  Blood cultures and UA ordered and pending.  His PCT is low, so viral panels were added on.  Also, urinary pneumococcus antigen was added on.  Labs show Cr. Of 1.67, which seems to be around baseline of his CKD, WBC 8.8 with normal differential.  CT scan of the chest showed no infiltrate, but enlarged main pulmonary artery and superficial venous collateralization of the anterior chest wall.  Blood cultures are pending.  UA needs to be collected.   Review of Systems: As per HPI otherwise all other systems reviewed and are negative.  Denies chest pain, pleuritic pain, swelling of the legs, changes  to the skin.  Denies urinary symptoms, diarrhea, constipation.  Denies leg pain or swelling.    Past Medical History:  Diagnosis Date   CHF (congestive heart failure) (HCC)    Diabetes mellitus without complication (HCC)    per pt-pre diabetic-Dr Dario Guardian stated said pt   DVT (deep venous thrombosis) (HCC)    Hepatic steatosis    Hypertension    Morbid obesity with BMI of 50.0-59.9, adult (HCC)    Peripheral vascular disease (HCC)    Presence of IVC filter    Ventricular trigeminy    Weakness of both lower limbs     Past Surgical History:  Procedure Laterality Date   CARDIAC ELECTROPHYSIOLOGY STUDY AND ABLATION  10/2020   PERIPHERAL VASCULAR CATHETERIZATION N/A 01/10/2015   Procedure: Dialysis/Perma Catheter Insertion;  Surgeon: Renford Dills, MD;  Location: [facility] INVASIVE CV LAB;  Service: Cardiovascular;  Laterality: N/A;   PERIPHERAL VASCULAR CATHETERIZATION N/A 02/24/2015   Procedure: Dialysis/Perma Catheter Removal;  Surgeon: Annice Needy, MD;  Location: [facility] INVASIVE CV LAB;  Service: Cardiovascular;  Laterality: N/A;    Social History  reports that he has never smoked. He has never used smokeless tobacco. He reports that he does not drink alcohol and does not use drugs.  Allergies  Allergen Reactions   Iodinated Contrast Media Other (See Comments)  Kidney failure requiring dialysis in 2013. He has had cta for PE since 2013.     Metrizamide Other (See Comments)    Kidney failure requiring dialysis Kidney failure requiring dialysis    Clindamycin/Lincomycin Rash   Lincomycin Rash   Sulfa Antibiotics Rash    Family History  Problem Relation Age of Onset   Hypertension Mother    Cancer Mother    Breast cancer Mother    Prostate cancer Father    Hypertension Father    Diabetes Father    Pulmonary embolism Paternal Grandfather    Pulmonary embolism Paternal Uncle     Prior to Admission medications   Medication Sig Start Date End Date Taking? Authorizing  Provider  empagliflozin (JARDIANCE) 25 MG TABS tablet Take 1 tablet (25 mg total) by mouth daily before breakfast. 12/09/21   Laural Benes, Megan P, DO  furosemide (LASIX) 40 MG tablet Take 1 tablet (40 mg total) by mouth daily. 01/28/22 04/28/22  Almon Hercules, MD  losartan (COZAAR) 50 MG tablet Take 1 tablet (50 mg total) by mouth daily. 02/03/22   [name], NP  metoprolol succinate (TOPROL-XL) 25 MG 24 hr tablet Take 1 tablet by mouth daily.    [provider]         XARELTO 20 MG TABS tablet Take 20 mg by mouth daily. 12/07/21   [provider]    Physical Exam: Vitals:   02/20/22 1531 02/20/22 1600 02/20/22 1612 02/20/22 1630  BP: 134/76 107/87    Pulse: (!) 110 (!) 107 (!) 102 98  Resp: (!) 28 19 (!) 27 (!) 24  Temp: (!) 102.2 F (39 C)     TempSrc: Oral     SpO2: 93% 93% 91% 93%  Weight:      Height:        Constitutional: Ill appearing, but not in distress.  Eyes: lids and conjunctivae normal ENMT: Mucous membranes are dry. Posterior pharynx clear of any exudate or lesions. Neck: normal, supple, no lymphadenopathy palpated.  Respiratory: difficult to assess given body habitus, no adventitious sounds noted. Cardiovascular: RR, NR, no murmur, but again, difficult to assess, good perfusion of hands, feet.  Abdomen: NT, ND, +BS, obese Musculoskeletal: no clubbing / cyanosis. Normal tone and bulk Skin: no rashes, lesions, ulcers on exposed skin.  Nails show onychomycosis.  He has some chronic skin changes to the lower legs with thick and darkened skin.  No edema.  Neurologic: Grossly intact, moving all extremities.  Psychiatric: Normal judgment and insight. Alert and oriented x 3.    Labs on Admission: I have personally reviewed following labs and imaging studies  CBC: Recent Labs  Lab 02/20/22 1357  WBC 8.8  NEUTROABS 6.5  HGB 15.9  HCT 50.3  MCV 92.5  PLT 270    Basic Metabolic Panel: Recent Labs  Lab 02/20/22 1357  NA 136  K 3.9  CL 102   CO2 25  GLUCOSE 123*  BUN 17  CREATININE 1.67*  CALCIUM 9.3    GFR: Estimated Creatinine Clearance: 76.5 mL/min (A) (by C-G formula based on SCr of 1.67 mg/dL (H)).  Liver Function Tests: Recent Labs  Lab 02/20/22 1357  AST 20  ALT 24  ALKPHOS 59  BILITOT 1.2  PROT 8.7*  ALBUMIN 4.1    Urine analysis:    Component Value Date/Time   COLORURINE STRAW (A) 01/25/2022 2200   APPEARANCEUR CLEAR (A) 01/25/2022 2200   APPEARANCEUR Hazy 12/25/2014 1655   LABSPEC 1.015 01/25/2022 2200  LABSPEC 1.012 12/25/2014 1655   PHURINE 6.0 01/25/2022 2200   GLUCOSEU >=500 (A) 01/25/2022 2200   GLUCOSEU Negative 12/25/2014 1655   HGBUR SMALL (A) 01/25/2022 2200   BILIRUBINUR NEGATIVE 01/25/2022 2200   BILIRUBINUR Negative 12/25/2014 1655   KETONESUR NEGATIVE 01/25/2022 2200   PROTEINUR 30 (A) 01/25/2022 2200   NITRITE NEGATIVE 01/25/2022 2200   LEUKOCYTESUR NEGATIVE 01/25/2022 2200   LEUKOCYTESUR Negative 12/25/2014 1655    Radiological Exams on Admission: CT Chest Wo Contrast  Result Date: 02/20/2022 CLINICAL DATA:  Pneumonia, abnormal chest radiograph EXAM: CT CHEST WITHOUT CONTRAST TECHNIQUE: Multidetector CT imaging of the chest was performed following the standard protocol without IV contrast. RADIATION DOSE REDUCTION: This exam was performed according to the departmental dose-optimization program which includes automated exposure control, adjustment of the mA and/or kV according to patient size and/or use of iterative reconstruction technique. COMPARISON:  None Available. FINDINGS: Cardiovascular: Superficial venous collateralization about the anterior chest wall (series 3). Normal heart size. Enlargement of the main pulmonary artery measuring up to 3.9 cm. No pericardial effusion. Mediastinum/Nodes: No enlarged mediastinal, hilar, or axillary lymph nodes. Thyroid gland, trachea, and esophagus demonstrate no significant findings. Lungs/Pleura: Lungs are clear. No pleural effusion or  pneumothorax. Upper Abdomen: No acute abnormality.  Hepatic steatosis. Musculoskeletal: No chest wall abnormality. No suspicious osseous lesions identified. IMPRESSION: 1. No evidence of acute airspace disease. 2. Enlargement of the main pulmonary artery measuring up to 3.9 cm, which can be seen in pulmonary hypertension. 3. Superficial venous collateralization about the anterior chest wall, suggesting central venous occlusion. 4. Hepatic steatosis. Electronically Signed   By: Jearld Lesch M.D.   On: 02/20/2022 15:37   DG Chest Port 1 View  Result Date: 02/20/2022 CLINICAL DATA:  Body aches, fatigue, and cough. EXAM: PORTABLE CHEST 1 VIEW COMPARISON:  Jan 25, 2022 FINDINGS: Mild opacity in the medial right lung base. The heart, hila, mediastinum, lungs, and pleura are otherwise unremarkable. IMPRESSION: Mild increased opacity in the medial right lung base could represent infiltrate or vascular crowding. A better volume PA and lateral chest x-ray could better evaluate. Electronically Signed   By: Gerome Sam III M.D.   On: 02/20/2022 14:33    EKG: Independently reviewed. Sinus tachycardia with frequent PVCs.   Assessment/Plan SOB (shortness of breath) Acute respiratory failure with hypoxia Fever, SIRS criteria met (temp > 38, HR > 90, RR > 20) - Source is currently unclear, so not sepsis currently.  He has no pneumonia on CT scan of the lungs.  UA is pending.  BC are pending.  He is started on Abx for CAP, but I will change these to unknown source at this time with cefepime.  He has a recent negative MRSA nares so will not continue vancomycin.  He reports no wounds or rash.  He has improved with antibiotics in the past - Respiratory panel, flu and COVID ordered - UA pending - If no source of infection, consider work up for rheumatological source of fever, hypoxia - Pulmonary artery enlarged - which can be seen in pulmonary hypertension - Group 1, 4 or 5.  Though this would not explain fever.  -  HIV pending - No recent travel reported - Repeat TTE (last in May of this year showed normal pulmonary arterial pressure) - Continue oxygen, goal would be 90-95% - IVF with NS at 75cc/hr given dry MM and elevated Cr (close to baseline)  Hypertension - Continue metoprolol - Hold losartan - Monitor BP closely  Diabetes mellitus without complication - Continue jardiance - SSI    CKD (chronic kidney disease), stage IIIa - Cr baseline is around 1.3 - 1.4, but has been has high as 1.6 - Currently 1.67 - Hold losartan - Low dose IV fluids with end time - AM labs    Chronic diastolic CHF (congestive heart failure) - Currently not in acute exacerbation - Hold home lasix, see above, low threshold to restart    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    DVT prophylaxis: Xarelto  Code Status:   Full  Disposition Plan:   Patient is from:  Home  Anticipated DC to:  Home  Anticipated DC date:  05/26/22  Anticipated DC barriers: Need for oxygen, further work up  [place] called:  None, may need ID  Admission status:  IP, progressive  Severity of Illness: The appropriate patient status for this patient is INPATIENT. Inpatient status is judged to be reasonable and necessary in order to provide the required intensity of service to ensure the patient's safety. The patient's presenting symptoms, physical exam findings, and initial radiographic and laboratory data in the context of their chronic comorbidities is felt to place them at high risk for further clinical deterioration. Furthermore, it is not anticipated that the patient will be medically stable for discharge from the hospital within 2 midnights of admission.   * I certify that at the point of admission it is my clinical judgment that the patient will require inpatient hospital care spanning beyond 2 midnights from the point of admission due to high intensity of  service, high risk for further deterioration and high frequency of surveillance required.Debe Coder MD [facility]  How to contact the [facility] Attending or Consulting provider 7A - 7P or covering provider during after hours 7P -7A, for this patient?   Check the care team in [facility] and look for a) attending/consulting [facility] provider listed and b) the [facility] team listed Log into [email or web address] and use [facility] universal password to access. If you do not have the password, please contact the hospital operator. Locate the [facility] provider you are looking for under [facility] and page to a number that you can be directly reached. If you still have difficulty reaching the provider, please page the [facility] (Director on Call) for the Hospitalists listed on amion for assistance.  02/20/2022, 4:38 PM

## 2022-02-20 NOTE — ED Triage Notes (Signed)
First Nurse Note:  Arrives via [facility] with c/o chills and fevers, body aches since last night.  Temp per EMS 99.5.  Has two IVC filters for Hx blood clots and takes Xarelto.  Treated and hospitalized for Sepsis last month.  Coughing productively clear sputum.  Per EMS  VS wnl.

## 2022-02-20 NOTE — ED Notes (Signed)
Patient transported to CT 

## 2022-02-21 ENCOUNTER — Inpatient Hospital Stay (HOSPITAL_COMMUNITY)
Admit: 2022-02-21 | Discharge: 2022-02-21 | Disposition: A | Discharge status: Home / Self Care | Payer: No Typology Code available for payment source | Attending: Internal Medicine | Admitting: Internal Medicine

## 2022-02-21 DIAGNOSIS — J9601 Acute respiratory failure with hypoxia: Secondary | ICD-10-CM

## 2022-02-21 DIAGNOSIS — I272 Pulmonary hypertension, unspecified: Secondary | ICD-10-CM | POA: Diagnosis not present

## 2022-02-21 LAB — BASIC METABOLIC PANEL
Anion gap: 7 (ref 5–15)
BUN: 19 mg/dL (ref 6–20)
CO2: 22 mmol/L (ref 22–32)
Calcium: 8.5 mg/dL — ABNORMAL LOW (ref 8.9–10.3)
Chloride: 105 mmol/L (ref 98–111)
Creatinine, Ser: 1.33 mg/dL — ABNORMAL HIGH (ref 0.61–1.24)
GFR, Estimated: >60 mL/min (ref >60)
Glucose, Bld: 125 mg/dL — ABNORMAL HIGH (ref 70–99)
Potassium: 3.8 mmol/L (ref 3.5–5.1)
Sodium: 134 mmol/L — ABNORMAL LOW (ref 135–145)

## 2022-02-21 LAB — RESPIRATORY PANEL BY PCR

## 2022-02-21 LAB — ECHOCARDIOGRAM COMPLETE
AR max vel: 3.51 cm2
AV Peak grad: 3.4 mmHg
Ao pk vel: 0.92 m/s
Area-P 1/2: 3.85 cm2
Height: 67 in
S' Lateral: 3.22 cm
Single Plane A4C EF: 61.1 %
Weight: 5746.07 oz

## 2022-02-21 LAB — CBC
HCT: 45 % (ref 39.0–52.0)
Hemoglobin: 14.4 g/dL (ref 13.0–17.0)
MCH: 29.2 pg (ref 26.0–34.0)
MCHC: 32 g/dL (ref 30.0–36.0)
MCV: 91.3 fL (ref 80.0–100.0)
Platelets: 215 10*3/uL (ref 150–400)
RBC: 4.93 MIL/uL (ref 4.22–5.81)
RDW: 14.9 % (ref 11.5–15.5)
WBC: 7.5 10*3/uL (ref 4.0–10.5)
nRBC: 0 % (ref 0.0–0.2)

## 2022-02-21 LAB — GLUCOSE, CAPILLARY
Glucose-Capillary: 162 mg/dL — ABNORMAL HIGH (ref 70–99)
Glucose-Capillary: 157 mg/dL — ABNORMAL HIGH (ref 70–99)
Glucose-Capillary: 112 mg/dL — ABNORMAL HIGH (ref 70–99)
Glucose-Capillary: 134 mg/dL — ABNORMAL HIGH (ref 70–99)

## 2022-02-21 LAB — C-REACTIVE PROTEIN: CRP: 14.5 mg/dL — ABNORMAL HIGH (ref <1.0)

## 2022-02-21 LAB — FERRITIN: Ferritin: 116 ng/mL (ref 24–336)

## 2022-02-21 LAB — SEDIMENTATION RATE: Sed Rate: 2 mm/hr (ref 0–20)

## 2022-02-21 LAB — STREP PNEUMONIAE URINARY ANTIGEN: Strep Pneumo Urinary Antigen: NEGATIVE

## 2022-02-21 LAB — CK: Total CK: 171 U/L (ref 49–397)

## 2022-02-21 MED ORDER — PERFLUTREN LIPID MICROSPHERE
1.0000 mL | INTRAVENOUS | Status: AC | PRN
  Administered 2022-02-21: 5 mL via INTRAVENOUS

## 2022-02-21 NOTE — Assessment & Plan Note (Signed)
 Continue Jardiance, sliding scale insulin.

## 2022-02-21 NOTE — Assessment & Plan Note (Signed)
   Serial BMP  Continue ARB for renal protection in diabetic

## 2022-02-21 NOTE — Progress Notes (Signed)
Mobility Specialist - Progress Note   02/21/22 1300  Mobility  Activity Ambulated independently in hallway;Stood at bedside;Dangled on edge of bed  Level of Assistance Standby assist, set-up cues, supervision of patient - no hands on  Assistive Device None  Distance Ambulated (ft) 200 ft  Activity Response Tolerated well  $Mobility charge 1 Mobility     Pt semi-supine upon arrival using RA stating that he didn't put it back on after washing up. SpO2 at 94%. Completes bed mobility ModI and STS indep. Pt voices mild light headiness upon standing and author puts pt on 3L with O2 at 92% for ambulation. Ambulates 274ft supervision with HR between 100-110 throughout. Pt returns to room and requests to try resting on RA. SpoO2 at 96% HR 94. RN notified and pt left with needs in reach.  Clarisa Schools Mobility Specialist 02/21/22, 1:41 PM

## 2022-02-21 NOTE — Assessment & Plan Note (Signed)
Following with hematology outpatient, on my review of the records he has tested negative for factor V Leiden, beta 2 glycoprotein, cardiolipin, Antithrombin.  Continue Xarelto

## 2022-02-22 DIAGNOSIS — J9601 Acute respiratory failure with hypoxia: Secondary | ICD-10-CM | POA: Diagnosis not present

## 2022-02-22 LAB — CBC
HCT: 45.6 % (ref 39.0–52.0)
Hemoglobin: 14.4 g/dL (ref 13.0–17.0)
MCH: 29.4 pg (ref 26.0–34.0)
MCHC: 31.6 g/dL (ref 30.0–36.0)
MCV: 93.1 fL (ref 80.0–100.0)
Platelets: 214 10*3/uL (ref 150–400)
RBC: 4.9 MIL/uL (ref 4.22–5.81)
RDW: 14.7 % (ref 11.5–15.5)
WBC: 4.5 10*3/uL (ref 4.0–10.5)
nRBC: 0 % (ref 0.0–0.2)

## 2022-02-22 LAB — GLUCOSE, CAPILLARY
Glucose-Capillary: 120 mg/dL — ABNORMAL HIGH (ref 70–99)
Glucose-Capillary: 129 mg/dL — ABNORMAL HIGH (ref 70–99)
Glucose-Capillary: 127 mg/dL — ABNORMAL HIGH (ref 70–99)
Glucose-Capillary: 124 mg/dL — ABNORMAL HIGH (ref 70–99)

## 2022-02-22 LAB — BASIC METABOLIC PANEL
Anion gap: 11 (ref 5–15)
BUN: 18 mg/dL (ref 6–20)
CO2: 22 mmol/L (ref 22–32)
Calcium: 8.7 mg/dL — ABNORMAL LOW (ref 8.9–10.3)
Chloride: 103 mmol/L (ref 98–111)
Creatinine, Ser: 1.41 mg/dL — ABNORMAL HIGH (ref 0.61–1.24)
GFR, Estimated: 60 mL/min — ABNORMAL LOW (ref >60)
Glucose, Bld: 101 mg/dL — ABNORMAL HIGH (ref 70–99)
Potassium: 3.6 mmol/L (ref 3.5–5.1)
Sodium: 136 mmol/L (ref 135–145)

## 2022-02-22 LAB — PATHOLOGIST SMEAR REVIEW

## 2022-02-22 LAB — FACTOR 5 LEIDEN

## 2022-02-22 LAB — RHEUMATOID FACTOR: Rheumatoid fact SerPl-aCnc: 14.9 IU/mL — ABNORMAL HIGH (ref <14.0)

## 2022-02-22 LAB — C3 COMPLEMENT: C3 Complement: 153 mg/dL (ref 82–167)

## 2022-02-22 LAB — C4 COMPLEMENT: Complement C4, Body Fluid: 27 mg/dL (ref 12–38)

## 2022-02-22 LAB — LYME DISEASE SEROLOGY W/REFLEX: Lyme Total Antibody EIA: NEGATIVE

## 2022-02-22 LAB — ANA W/REFLEX IF POSITIVE: Anti Nuclear Antibody (ANA): NEGATIVE

## 2022-02-22 MED ORDER — FUROSEMIDE 40 MG PO TABS
40.0000 mg | ORAL_TABLET | Freq: Every day | ORAL | Status: DC
  Administered 2022-02-22 – 2022-02-23 (×2): 40 mg via ORAL
  Filled 2022-02-22 (×2): qty 1

## 2022-02-22 MED ORDER — LOSARTAN POTASSIUM 50 MG PO TABS
50.0000 mg | ORAL_TABLET | Freq: Every day | ORAL | Status: DC
  Administered 2022-02-22 – 2022-02-23 (×2): 50 mg via ORAL
  Filled 2022-02-22 (×2): qty 1

## 2022-02-22 MED ORDER — MAGNESIUM OXIDE -MG SUPPLEMENT 400 (240 MG) MG PO TABS
400.0000 mg | ORAL_TABLET | Freq: Every day | ORAL | Status: DC
  Administered 2022-02-22 – 2022-02-23 (×2): 400 mg via ORAL
  Filled 2022-02-22 (×2): qty 1

## 2022-02-22 NOTE — Plan of Care (Signed)

## 2022-02-23 DIAGNOSIS — J9601 Acute respiratory failure with hypoxia: Secondary | ICD-10-CM | POA: Diagnosis not present

## 2022-02-23 LAB — COMPREHENSIVE METABOLIC PANEL
ALT: 20 U/L (ref 0–44)
AST: 22 U/L (ref 15–41)
Albumin: 3.5 g/dL (ref 3.5–5.0)
Alkaline Phosphatase: 45 U/L (ref 38–126)
Anion gap: 11 (ref 5–15)
BUN: 24 mg/dL — ABNORMAL HIGH (ref 6–20)
CO2: 21 mmol/L — ABNORMAL LOW (ref 22–32)
Calcium: 8.9 mg/dL (ref 8.9–10.3)
Chloride: 104 mmol/L (ref 98–111)
Creatinine, Ser: 1.42 mg/dL — ABNORMAL HIGH (ref 0.61–1.24)
GFR, Estimated: 59 mL/min — ABNORMAL LOW (ref >60)
Glucose, Bld: 98 mg/dL (ref 70–99)
Potassium: 3.6 mmol/L (ref 3.5–5.1)
Sodium: 136 mmol/L (ref 135–145)
Total Bilirubin: 1 mg/dL (ref 0.3–1.2)
Total Protein: 7.8 g/dL (ref 6.5–8.1)

## 2022-02-23 LAB — ROCKY MTN SPOTTED FVR ABS PNL(IGG+IGM)
RMSF IgG: NEGATIVE
RMSF IgM: 0.34 index (ref 0.00–0.89)

## 2022-02-23 LAB — ANCA PROFILE
Anti-MPO Antibodies: <0.2 units (ref 0.0–0.9)
Anti-PR3 Antibodies: <0.2 units (ref 0.0–0.9)
Atypical P-ANCA titer: <1:20 {titer}
C-ANCA: <1:20 {titer}
P-ANCA: <1:20 {titer}

## 2022-02-23 LAB — CBC
HCT: 44.4 % (ref 39.0–52.0)
Hemoglobin: 14.1 g/dL (ref 13.0–17.0)
MCH: 29.9 pg (ref 26.0–34.0)
MCHC: 31.8 g/dL (ref 30.0–36.0)
MCV: 94.1 fL (ref 80.0–100.0)
Platelets: 237 10*3/uL (ref 150–400)
RBC: 4.72 MIL/uL (ref 4.22–5.81)
RDW: 14.5 % (ref 11.5–15.5)
WBC: 4.1 10*3/uL (ref 4.0–10.5)
nRBC: 0 % (ref 0.0–0.2)

## 2022-02-23 LAB — GLUCOSE, CAPILLARY
Glucose-Capillary: 108 mg/dL — ABNORMAL HIGH (ref 70–99)
Glucose-Capillary: 125 mg/dL — ABNORMAL HIGH (ref 70–99)

## 2022-02-23 LAB — THYROID PANEL WITH TSH
Free Thyroxine Index: 2.2 (ref 1.2–4.9)
T3 Uptake Ratio: 32 % (ref 24–39)
T4, Total: 6.9 ug/dL (ref 4.5–12.0)
TSH: 1.08 u[IU]/mL (ref 0.450–4.500)

## 2022-02-23 MED ORDER — METOPROLOL SUCCINATE ER 25 MG PO TB24: 25.0000 mg | ORAL_TABLET | Freq: Every day | ORAL | Status: DC

## 2022-02-23 MED ORDER — ALBUTEROL SULFATE HFA 108 (90 BASE) MCG/ACT IN AERS: 2.0000 | INHALATION_SPRAY | Freq: Four times a day (QID) | RESPIRATORY_TRACT | 2 refills | Status: AC | PRN

## 2022-02-23 MED ORDER — ACETAMINOPHEN 500 MG PO TABS: 1000.0000 mg | ORAL_TABLET | Freq: Four times a day (QID) | ORAL | 0 refills | Status: DC | PRN

## 2022-02-25 ENCOUNTER — Telehealth: Payer: Self-pay | Admitting: *Deleted

## 2022-02-25 NOTE — Telephone Encounter (Signed)
Transition Care Management Follow-up Telephone Call Date of discharge and from where: [facility] 02-23-2022 How have you been since you were released from the hospital? Feeling good Any questions or concerns? No  Items Reviewed: Did the pt receive and understand the discharge instructions provided? Yes  Medications obtained and verified? Yes  Other? No  Any new allergies since your discharge? No  Dietary orders reviewed? No Do you have support at home? Yes   Home Care and Equipment/Supplies: Were home health services ordered?  If so, what is the name of the agency?   Has the agency set up a time to come to the patient's home?  Were any new equipment or medical supplies ordered?   What is the name of the medical supply agency?  Were you able to get the supplies/equipment?  Do you have any questions related to the use of the equipment or supplies?   Functional Questionnaire: (I = Independent and D = Dependent) ADLs: I  Bathing/Dressing- I  Meal Prep- I  Eating- I  Maintaining continence- I  Transferring/Ambulation- I  Managing Meds- I  Follow up appointments reviewed:  PCP Hospital f/u appt confirmed? Yes  Scheduled to see [phone] on  [facility] f/u appt confirmed? No  Scheduled to see  on  @ . Are transportation arrangements needed? No  If their condition worsens, is the pt aware to call PCP or go to the Emergency Dept.? Yes Was the patient provided with contact information for the PCP's office or ED? Yes Was to pt encouraged to call back with questions or concerns? Yes

## 2022-02-26 LAB — CULTURE, BLOOD (ROUTINE X 2)
Culture: NO GROWTH
Culture: NO GROWTH
Special Requests: ADEQUATE
Special Requests: ADEQUATE

## 2022-02-26 LAB — QUANTIFERON-TB GOLD PLUS: QuantiFERON-TB Gold Plus: NEGATIVE

## 2022-02-26 LAB — QUANTIFERON-TB GOLD PLUS (RQFGPL)
QuantiFERON Mitogen Value: >10 IU/mL
QuantiFERON Nil Value: 0.08 IU/mL
QuantiFERON TB1 Ag Value: 0.02 IU/mL
QuantiFERON TB2 Ag Value: 0.03 IU/mL

## 2022-03-01 ENCOUNTER — Ambulatory Visit (INDEPENDENT_AMBULATORY_CARE_PROVIDER_SITE_OTHER): Payer: No Typology Code available for payment source | Admitting: Physician Assistant

## 2022-03-01 ENCOUNTER — Encounter: Payer: Self-pay | Admitting: Physician Assistant

## 2022-03-01 VITALS — BP 123/73 | HR 89 | Temp 98.7°F | Ht 67.0 in | Wt 355.8 lb

## 2022-03-01 DIAGNOSIS — Z09 Encounter for follow-up examination after completed treatment for conditions other than malignant neoplasm: Secondary | ICD-10-CM

## 2022-03-01 DIAGNOSIS — J9601 Acute respiratory failure with hypoxia: Secondary | ICD-10-CM | POA: Diagnosis not present

## 2022-03-01 NOTE — Progress Notes (Signed)
Hospital Follow Up   Name: Ryan [name]   MRN: [id number]    DOB: [birth year]   Date:03/01/2022  Today's Provider: Talitha Givens, MHS, PA-C Introduced myself to the patient as a PA-C and provided education on APPs in clinical practice.         Subjective  Chief Complaint  Chief Complaint  Patient presents with   Blood Infection    Patient is here for hospitalization follow up on 02/20/22 for Sepsis. Patient says he has been hospitalized three times within the last month or two. Patient says he would like to discuss what keeps putting him there in the hospital.    States he is feeling much better now  States this is the 3rd time he's been in the hospital for sepsis in recent months States he is concerned for this as he will be hit with chills and then have difficulty breathing without any buildup.   His last apt with Hematology was prior to hospitalization.  He has not seen Pulmonology recently- discussed rescheduling to help with eval of lungs   Reports he is feeling better with breathing since hospitalization- states his typical SOB seems improved and he is able to walk a bit further without persistent SOB.   Transition of Care Hospital Follow up.   Hospital/Facility: [facility] D/C Physician:  Emeterio Reeve, DO D/C Date: 02/23/22  Records Requested:  Records Received:  Records Reviewed: Hospital discharge summary, ED course   Diagnoses on Discharge:   Acute respiratory failure with hypoxia, recurrent, in setting of previous treatment for pneumonia, relapsing/remitting fever, no previous work-up for malignant or autoimmune cause or unusual infection  Date of interactive Contact within 48 hours of discharge: 02/25/22 Contact was through: phone  Date of 7 day or 14 day face-to-face visit:    within 7 days    Diagnostic Tests Reviewed/Disposition:  CMP, CBC, Quantiferon Gold,  Autoimmune panel, TSH, Lyme disease panel, UA, blood cultures, EKG, CXR, RMSF,   Consults: None    Discharge Instructions  Disease/illness Education: provided.   Home Health/Community Services Discussions/Referrals:  Establishment or re-establishment of referral orders for community resources: Recommended to discus ref to Rheumatology. Pt was recommended to follow up with Pulmonology team and Hematology   Discussion with other health care providers: PCP, Jon Billings, NP   Assessment and Support of treatment regimen adherence:  Appointments Coordinated with: Referrals sent to Immunology and Rheumatology   Education for self-management, independent living, and ADLs:    HPI   Patient Active Problem List   Diagnosis Date Noted   Community acquired bacterial pneumonia 02/20/2022   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 ([place]) 02/09/2022   Lymphadenopathy 02/09/2022   Physical deconditioning 01/27/2022   At risk for sleep apnea 01/27/2022   VTE (venous thromboembolism) 01/26/2022   Sepsis with acute hypoxic respiratory failure ([place])    Chronic diastolic CHF (congestive heart failure) ([place]) 01/04/2022   Acute respiratory failure with hypoxia, recurrent, in setting of previous treatment for pneumonia, relapsing/remitting fever, no previous work-up for malignant or autoimmune cause or unusual infection 01/01/2022   Severe sepsis ([place]) 01/01/2022   Clotting disorder ([place]) 10/19/2021   Angina at rest [facility]) 07/29/2021   Hypercholesterolemia 07/29/2021   Cellulitis of right foot 06/03/2021   Atrial fibrillation with rapid ventricular response ([place]) 10/14/2020   Atrial flutter with rapid ventricular response ([place]) 10/01/2020   Knee pain 03/11/2020   Weakness of both lower limbs 11/17/2017   Diabetes  mellitus without complication ([place]) 35/57/3220   CKD (chronic kidney disease), stage IIIa 08/03/2017   Hypertension 12/15/2016   Fatigue 12/15/2016   PVC (premature ventricular contraction) 11/24/2016   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   History of pulmonary  embolism and DVT 12/28/2014   Hepatic steatosis 05/30/2013   Morbid obesity with BMI of 50.0-59.9, adult ([place]) 05/30/2013   Proteinuria 05/30/2013    Past Surgical History:  Procedure Laterality Date   CARDIAC ELECTROPHYSIOLOGY STUDY AND ABLATION  10/2020   PERIPHERAL VASCULAR CATHETERIZATION N/A 01/10/2015   Procedure: Dialysis/Perma Catheter Insertion;  Surgeon: Katha Cabal, MD;  Location: [facility];  Service: Cardiovascular;  Laterality: N/A;   PERIPHERAL VASCULAR CATHETERIZATION N/A 02/24/2015   Procedure: Dialysis/Perma Catheter Removal;  Surgeon: Algernon Huxley, MD;  Location: [facility];  Service: Cardiovascular;  Laterality: N/A;    Family History  Problem Relation Age of Onset   Hypertension Mother    Cancer Mother    Breast cancer Mother    Prostate cancer Father    Hypertension Father    Diabetes Father    Pulmonary embolism Paternal Grandfather    Pulmonary embolism Paternal Uncle     Social History   Tobacco Use   Smoking status: Never   Smokeless tobacco: Never  Substance Use Topics   Alcohol use: No     Current Outpatient Medications:    acetaminophen (TYLENOL) 500 MG tablet, Take 2 tablets (1,000 mg total) by mouth every 6 (six) hours as needed for mild pain, moderate pain, fever or headache., Disp: 30 tablet, Rfl: 0   albuterol (VENTOLIN HFA) 108 (90 Base) MCG/ACT inhaler, Inhale 2 puffs into the lungs every 6 (six) hours as needed for wheezing or shortness of breath., Disp: 8 g, Rfl: 2   empagliflozin (JARDIANCE) 25 MG TABS tablet, Take 1 tablet (25 mg total) by mouth daily before breakfast., Disp: 90 tablet, Rfl: 1   furosemide (LASIX) 40 MG tablet, Take 1 tablet (40 mg total) by mouth daily., Disp: 90 tablet, Rfl: 0   losartan (COZAAR) 50 MG tablet, Take 1 tablet (50 mg total) by mouth daily., Disp: 90 tablet, Rfl: 1   magnesium oxide (MAG-OX) 400 (240 Mg) MG tablet, Take 400 mg by mouth daily., Disp: , Rfl:    metoprolol  succinate (TOPROL-XL) 25 MG 24 hr tablet, Take 1 tablet (25 mg total) by mouth daily., Disp: , Rfl:    rivaroxaban (XARELTO) 20 MG TABS tablet, Take 20 mg by mouth daily., Disp: , Rfl:    tirzepatide (MOUNJARO) 2.5 MG/0.5ML Pen, Inject 2.5 mg into the skin once a week., Disp: 2 mL, Rfl: 0  Allergies  Allergen Reactions   Iodinated Contrast Media Other (See Comments)    Kidney failure requiring dialysis in 2013. He has had cta for PE since 2013.     Metrizamide Other (See Comments)    Kidney failure requiring dialysis Kidney failure requiring dialysis    Clindamycin/Lincomycin Rash   Lincomycin Rash   Sulfa Antibiotics Rash    I personally reviewed active problem list, medication list, allergies, notes from last encounter, lab results with the patient/caregiver today.   Review of Systems  Constitutional:  Negative for chills and fever.  Respiratory:  Negative for shortness of breath and wheezing.   Cardiovascular:  Negative for chest pain and palpitations.  Skin:  Negative for itching and rash.  Neurological:  Negative for dizziness, loss of consciousness, weakness and headaches.      Objective  Vitals:  03/01/22 0906  BP: 123/73  Pulse: 89  Temp: 98.7 F (37.1 C)  TempSrc: Oral  SpO2: 93%  Weight: (!) 355 lb 12.8 oz (161.4 kg)  Height: $Remove'5\' 7"'CQfhvvs$  (1.702 m)    Body mass index is 55.73 kg/m.  Physical Exam Vitals reviewed.  Constitutional:      General: He is awake.     Appearance: Normal appearance. He is well-developed and well-groomed. He is morbidly obese.  HENT:     Head: Normocephalic and atraumatic.  Cardiovascular:     Rate and Rhythm: Normal rate and regular rhythm.     Pulses: Normal pulses.          Radial pulses are 2+ on the right side and 2+ on the left side.     Heart sounds: Normal heart sounds.  Pulmonary:     Effort: Pulmonary effort is normal.     Breath sounds: Normal breath sounds. No decreased breath sounds, wheezing, rhonchi or rales.   Musculoskeletal:     Cervical back: Normal range of motion and neck supple.     Right lower leg: Edema present.     Left lower leg: Edema present.  Neurological:     General: No focal deficit present.     Mental Status: He is alert and oriented to person, place, and time.  Psychiatric:        Mood and Affect: Mood normal.        Behavior: Behavior normal. Behavior is cooperative.        Thought Content: Thought content normal.        Judgment: Judgment normal.      Recent Results (from the past 2160 hour(s))  Resp Panel by RT-PCR (Flu A&B, Covid) Nasopharyngeal Swab     Status: None   Collection Time: 12/31/21  6:11 PM   Specimen: Nasopharyngeal Swab; Nasopharyngeal(NP) swabs in vial transport medium  Result Value Ref Range   SARS Coronavirus 2 by RT PCR NEGATIVE NEGATIVE    Comment: (NOTE) SARS-CoV-2 target nucleic acids are NOT DETECTED.  The SARS-CoV-2 RNA is generally detectable in upper respiratory specimens during the acute phase of infection. The lowest concentration of SARS-CoV-2 viral copies this assay can detect is 138 copies/mL. A negative result does not preclude SARS-Cov-2 infection and should not be used as the sole basis for treatment or other patient management decisions. A negative result may occur with  improper specimen collection/handling, submission of specimen other than nasopharyngeal swab, presence of viral mutation(s) within the areas targeted by this assay, and inadequate number of viral copies(<138 copies/mL). A negative result must be combined with clinical observations, patient history, and epidemiological information. The expected result is Negative.  Fact Sheet for Patients:  EntrepreneurPulse.com.au  Fact Sheet for Healthcare Providers:  IncredibleEmployment.be  This test is no t yet approved or cleared by the [place] FDA and  has been authorized for detection and/or diagnosis of SARS-CoV-2 by FDA  under an Emergency Use Authorization (EUA). This EUA will remain  in effect (meaning this test can be used) for the duration of the COVID-19 declaration under Section 564(b)(1) of the Act, 21 U.S.C.section 360bbb-3(b)(1), unless the authorization is terminated  or revoked sooner.       Influenza A by PCR NEGATIVE NEGATIVE   Influenza B by PCR NEGATIVE NEGATIVE    Comment: (NOTE) The Xpert Xpress SARS-CoV-2/FLU/RSV plus assay is intended as an aid in the diagnosis of influenza from Nasopharyngeal swab specimens and should not be used as a  sole basis for treatment. Nasal washings and aspirates are unacceptable for Xpert Xpress SARS-CoV-2/FLU/RSV testing.  Fact Sheet for Patients: EntrepreneurPulse.com.au  Fact Sheet for Healthcare Providers: IncredibleEmployment.be  This test is not yet approved or cleared by the [place] FDA and has been authorized for detection and/or diagnosis of SARS-CoV-2 by FDA under an Emergency Use Authorization (EUA). This EUA will remain in effect (meaning this test can be used) for the duration of the COVID-19 declaration under Section 564(b)(1) of the Act, 21 U.S.C. section 360bbb-3(b)(1), unless the authorization is terminated or revoked.  Performed at [facility], [address] [place], [place] [address]   Blood Culture (routine x 2)     Status: None   Collection Time: 12/31/21  6:11 PM   Specimen: BLOOD  Result Value Ref Range   Specimen Description BLOOD BLH    Special Requests BOTTLES DRAWN AEROBIC AND ANAEROBIC BCLV    Culture      NO GROWTH 5 DAYS Performed at [facility], [address] [place], [place] [address]    Report Status 01/05/2022 FINAL   Lactic acid, plasma     Status: Abnormal   Collection Time: 12/31/21  6:28 PM  Result Value Ref Range   Lactic Acid, Venous 2.6 (HH) 0.5 - 1.9 mmol/L    Comment: CRITICAL RESULT CALLED TO, READ BACK BY AND VERIFIED  WITH RACHEL HARKLESS AT 1905 ON 12/31/21 BY SS Performed at [facility], [address] [place], [place] [address]   Comprehensive metabolic panel     Status: Abnormal   Collection Time: 12/31/21  6:28 PM  Result Value Ref Range   Sodium 140 135 - 145 mmol/L   Potassium 4.6 3.5 - 5.1 mmol/L   Chloride 106 98 - 111 mmol/L   CO2 25 22 - 32 mmol/L   Glucose, Bld 117 (H) 70 - 99 mg/dL    Comment: Glucose reference range applies only to samples taken after fasting for at least 8 hours.   BUN 16 6 - 20 mg/dL   Creatinine, Ser 1.42 (H) 0.61 - 1.24 mg/dL   Calcium 9.0 8.9 - 10.3 mg/dL   Total Protein 8.3 (H) 6.5 - 8.1 g/dL   Albumin 4.1 3.5 - 5.0 g/dL   AST 28 15 - 41 U/L   ALT 34 0 - 44 U/L   Alkaline Phosphatase 59 38 - 126 U/L   Total Bilirubin 1.0 0.3 - 1.2 mg/dL   GFR, Estimated 59 (L) >60 mL/min    Comment: (NOTE) Calculated using the CKD-EPI Creatinine Equation (2021)    Anion gap 9 5 - 15    Comment: Performed at [facility], [address] [place], [place] [address]  CBC with Differential     Status: None   Collection Time: 12/31/21  6:28 PM  Result Value Ref Range   WBC 9.4 4.0 - 10.5 K/uL   RBC 5.11 4.22 - 5.81 MIL/uL   Hemoglobin 15.4 13.0 - 17.0 g/dL   HCT 49.1 39.0 - 52.0 %   MCV 96.1 80.0 - 100.0 fL   MCH 30.1 26.0 - 34.0 pg   MCHC 31.4 30.0 - 36.0 g/dL   RDW 14.8 11.5 - 15.5 %   Platelets 238 150 - 400 K/uL   nRBC 0.0 0.0 - 0.2 %   Neutrophils Relative % 78 %   Neutro Abs 7.3 1.7 - 7.7 K/uL   Lymphocytes Relative 13 %   Lymphs Abs 1.3 0.7 - 4.0 K/uL  Monocytes Relative 7 %   Monocytes Absolute 0.6 0.1 - 1.0 K/uL   Eosinophils Relative 2 %   Eosinophils Absolute 0.2 0.0 - 0.5 K/uL   Basophils Relative 0 %   Basophils Absolute 0.0 0.0 - 0.1 K/uL   Immature Granulocytes 0 %   Abs Immature Granulocytes 0.02 0.00 - 0.07 K/uL    Comment: Performed at [facility], [address] [place], [place] [address]  Protime-INR      Status: None   Collection Time: 12/31/21  6:28 PM  Result Value Ref Range   Prothrombin Time 13.4 11.4 - 15.2 seconds   INR 1.0 0.8 - 1.2    Comment: (NOTE) INR goal varies based on device and disease states. Performed at [facility], [address] [place], [place] [address]   APTT     Status: None   Collection Time: 12/31/21  6:28 PM  Result Value Ref Range   aPTT 25 24 - 36 seconds    Comment: Performed at [facility], [address] [place], [place] [address]  Procalcitonin - Baseline     Status: None   Collection Time: 12/31/21  6:28 PM  Result Value Ref Range   Procalcitonin <0.10 ng/mL    Comment:        Interpretation: PCT (Procalcitonin) <= 0.5 ng/mL: Systemic infection (sepsis) is not likely. Local bacterial infection is possible. (NOTE)       Sepsis PCT Algorithm           Lower Respiratory Tract                                      Infection PCT Algorithm    ----------------------------     ----------------------------         PCT < 0.25 ng/mL                PCT < 0.10 ng/mL          Strongly encourage             Strongly discourage   discontinuation of antibiotics    initiation of antibiotics    ----------------------------     -----------------------------       PCT 0.25 - 0.50 ng/mL            PCT 0.10 - 0.25 ng/mL               OR       >80% decrease in PCT            Discourage initiation of                                            antibiotics      Encourage discontinuation           of antibiotics    ----------------------------     -----------------------------         PCT >= 0.50 ng/mL              PCT 0.26 - 0.50 ng/mL               AND        <80% decrease in PCT             Encourage initiation of  antibiotics       Encourage continuation           of antibiotics    ----------------------------     -----------------------------        PCT >= 0.50 ng/mL                  PCT >  0.50 ng/mL               AND         increase in PCT                  Strongly encourage                                      initiation of antibiotics    Strongly encourage escalation           of antibiotics                                     -----------------------------                                           PCT <= 0.25 ng/mL                                                 OR                                        > 80% decrease in PCT                                      Discontinue / Do not initiate                                             antibiotics  Performed at [facility], [address] [place], [place] [address]   Blood Culture (routine x 2)     Status: None   Collection Time: 12/31/21  7:14 PM   Specimen: BLOOD  Result Value Ref Range   Specimen Description BLOOD BLOOD RIGHT HAND    Special Requests      BOTTLES DRAWN AEROBIC AND ANAEROBIC Blood Culture results may not be optimal due to an inadequate volume of blood received in culture bottles   Culture      NO GROWTH 5 DAYS Performed at [facility], [address], [place], [place] [address]    Report Status 01/05/2022 FINAL   Urinalysis, Complete w Microscopic     Status: Abnormal   Collection Time: 12/31/21  7:14 PM  Result Value Ref Range   Color, Urine YELLOW (A) YELLOW   APPearance CLEAR (A) CLEAR   Specific Gravity, Urine 1.015 1.005 - 1.030   pH 6.0 5.0 - 8.0  Glucose, UA NEGATIVE NEGATIVE mg/dL   Hgb urine dipstick NEGATIVE NEGATIVE   Bilirubin Urine NEGATIVE NEGATIVE   Ketones, ur NEGATIVE NEGATIVE mg/dL   Protein, ur 30 (A) NEGATIVE mg/dL   Nitrite NEGATIVE NEGATIVE   Leukocytes,Ua NEGATIVE NEGATIVE   RBC / HPF 0-5 0 - 5 RBC/hpf   WBC, UA 0-5 0 - 5 WBC/hpf   Bacteria, UA NONE SEEN NONE SEEN   Squamous Epithelial / LPF 0-5 0 - 5   Mucus PRESENT     Comment: Performed at [facility], [address] [place], [place] [address]  Urine Culture     Status:  Abnormal   Collection Time: 12/31/21  7:14 PM   Specimen: Urine, Random  Result Value Ref Range   Specimen Description      URINE, RANDOM Performed at [facility], [address], [place], [place] [address]    Special Requests      NONE Performed at [facility], [address] [place], [place] [address]    Culture MULTIPLE SPECIES PRESENT, SUGGEST RECOLLECTION (A)    Report Status 01/02/2022 FINAL   Lactic acid, plasma     Status: None   Collection Time: 12/31/21  8:46 PM  Result Value Ref Range   Lactic Acid, Venous 1.6 0.5 - 1.9 mmol/L    Comment: Performed at [facility], [address] [place], [place] [address]  CBG monitoring, ED     Status: Abnormal   Collection Time: 01/01/22 12:54 AM  Result Value Ref Range   Glucose-Capillary 125 (H) 70 - 99 mg/dL    Comment: Glucose reference range applies only to samples taken after fasting for at least 8 hours.  Cortisol-am, blood     Status: None   Collection Time: 01/01/22  5:09 AM  Result Value Ref Range   Cortisol - AM 20.4 6.7 - 22.6 ug/dL    Comment: Performed at [facility], [place] [address], [place], [place] [address]  Procalcitonin     Status: None   Collection Time: 01/01/22  6:26 AM  Result Value Ref Range   Procalcitonin 1.71 ng/mL    Comment:        Interpretation: PCT > 0.5 ng/mL and <= 2 ng/mL: Systemic infection (sepsis) is possible, but other conditions are known to elevate PCT as well. (NOTE)       Sepsis PCT Algorithm           Lower Respiratory Tract                                      Infection PCT Algorithm    ----------------------------     ----------------------------         PCT < 0.25 ng/mL                PCT < 0.10 ng/mL          Strongly encourage             Strongly discourage   discontinuation of antibiotics    initiation of antibiotics    ----------------------------     -----------------------------       PCT 0.25 - 0.50 ng/mL            PCT 0.10 -  0.25 ng/mL               OR       >80%  decrease in PCT            Discourage initiation of                                            antibiotics      Encourage discontinuation           of antibiotics    ----------------------------     -----------------------------         PCT >= 0.50 ng/mL              PCT 0.26 - 0.50 ng/mL                AND       <80% decrease in PCT             Encourage initiation of                                             antibiotics       Encourage continuation           of antibiotics    ----------------------------     -----------------------------        PCT >= 0.50 ng/mL                  PCT > 0.50 ng/mL               AND         increase in PCT                  Strongly encourage                                      initiation of antibiotics    Strongly encourage escalation           of antibiotics                                     -----------------------------                                           PCT <= 0.25 ng/mL                                                 OR                                        > 80% decrease in PCT                                      Discontinue / Do not initiate  antibiotics  Performed at [facility], [address] [place], [place] [address]   Protime-INR     Status: Abnormal   Collection Time: 01/01/22  6:26 AM  Result Value Ref Range   Prothrombin Time 15.4 (H) 11.4 - 15.2 seconds   INR 1.2 0.8 - 1.2    Comment: (NOTE) INR goal varies based on device and disease states. Performed at [facility], [address] [place], [place] [address]   CBG monitoring, ED     Status: Abnormal   Collection Time: 01/01/22  7:48 AM  Result Value Ref Range   Glucose-Capillary 131 (H) 70 - 99 mg/dL    Comment: Glucose reference range applies only to samples taken after fasting for at least 8 hours.  MRSA Next Gen by PCR, Nasal     Status: None    Collection Time: 01/01/22  8:28 AM   Specimen: Nasal Mucosa; Nasal Swab  Result Value Ref Range   MRSA by PCR Next Gen NOT DETECTED NOT DETECTED    Comment: (NOTE) The GeneXpert MRSA Assay (FDA approved for NASAL specimens only), is one component of a comprehensive MRSA colonization surveillance program. It is not intended to diagnose MRSA infection nor to guide or monitor treatment for MRSA infections. Test performance is not FDA approved in patients less than 87 years old. Performed at [facility], [address] [place], [place] [address]   Creatinine, serum     Status: Abnormal   Collection Time: 01/01/22  8:28 AM  Result Value Ref Range   Creatinine, Ser 1.44 (H) 0.61 - 1.24 mg/dL   GFR, Estimated 58 (L) >60 mL/min    Comment: (NOTE) Calculated using the CKD-EPI Creatinine Equation (2021) Performed at [facility], [address] [place], [place] [address]   CBG monitoring, ED     Status: Abnormal   Collection Time: 01/01/22 11:47 AM  Result Value Ref Range   Glucose-Capillary 113 (H) 70 - 99 mg/dL    Comment: Glucose reference range applies only to samples taken after fasting for at least 8 hours.  CBG monitoring, ED     Status: Abnormal   Collection Time: 01/01/22  4:47 PM  Result Value Ref Range   Glucose-Capillary 121 (H) 70 - 99 mg/dL    Comment: Glucose reference range applies only to samples taken after fasting for at least 8 hours.  Brain natriuretic peptide     Status: None   Collection Time: 01/01/22  6:16 PM  Result Value Ref Range   B Natriuretic Peptide 67.8 0.0 - 100.0 pg/mL    Comment: Performed at [facility], [place], [place] [address]  Procalcitonin - Baseline     Status: None   Collection Time: 01/01/22  6:16 PM  Result Value Ref Range   Procalcitonin 1.50 ng/mL    Comment:        Interpretation: PCT > 0.5 ng/mL and <= 2 ng/mL: Systemic infection (sepsis) is possible, but other conditions are known  to elevate PCT as well. (NOTE)       Sepsis PCT Algorithm           Lower Respiratory Tract                                      Infection PCT Algorithm    ----------------------------     ----------------------------  PCT < 0.25 ng/mL                PCT < 0.10 ng/mL          Strongly encourage             Strongly discourage   discontinuation of antibiotics    initiation of antibiotics    ----------------------------     -----------------------------       PCT 0.25 - 0.50 ng/mL            PCT 0.10 - 0.25 ng/mL               OR       >80% decrease in PCT            Discourage initiation of                                            antibiotics      Encourage discontinuation           of antibiotics    ----------------------------     -----------------------------         PCT >= 0.50 ng/mL              PCT 0.26 - 0.50 ng/mL                AND       <80% decrease in PCT             Encourage initiation of                                             antibiotics       Encourage continuation           of antibiotics    ----------------------------     -----------------------------        PCT >= 0.50 ng/mL                  PCT > 0.50 ng/mL               AND         increase in PCT                  Strongly encourage                                      initiation of antibiotics    Strongly encourage escalation           of antibiotics                                     -----------------------------                                           PCT <= 0.25 ng/mL  OR                                        > 80% decrease in PCT                                      Discontinue / Do not initiate                                             antibiotics  Performed at [facility], [address] [place], [place] [address]   Lactic acid, plasma     Status: None   Collection Time: 01/01/22  6:16 PM  Result Value Ref Range    Lactic Acid, Venous 0.8 0.5 - 1.9 mmol/L    Comment: Performed at [facility], [address] [place], [place] [address]  Lactic acid, plasma     Status: None   Collection Time: 01/01/22  9:16 PM  Result Value Ref Range   Lactic Acid, Venous 1.1 0.5 - 1.9 mmol/L    Comment: Performed at [facility], [address] [place], [place] [address]  CBG monitoring, ED     Status: Abnormal   Collection Time: 01/01/22  9:42 PM  Result Value Ref Range   Glucose-Capillary 115 (H) 70 - 99 mg/dL    Comment: Glucose reference range applies only to samples taken after fasting for at least 8 hours.   Comment 1 Document in Chart   Comprehensive metabolic panel     Status: Abnormal   Collection Time: 01/02/22  5:00 AM  Result Value Ref Range   Sodium 135 135 - 145 mmol/L   Potassium 4.0 3.5 - 5.1 mmol/L   Chloride 105 98 - 111 mmol/L   CO2 23 22 - 32 mmol/L   Glucose, Bld 105 (H) 70 - 99 mg/dL    Comment: Glucose reference range applies only to samples taken after fasting for at least 8 hours.   BUN 15 6 - 20 mg/dL   Creatinine, Ser 1.41 (H) 0.61 - 1.24 mg/dL   Calcium 8.6 (L) 8.9 - 10.3 mg/dL   Total Protein 7.5 6.5 - 8.1 g/dL   Albumin 3.4 (L) 3.5 - 5.0 g/dL   AST 21 15 - 41 U/L   ALT 25 0 - 44 U/L   Alkaline Phosphatase 45 38 - 126 U/L   Total Bilirubin 0.9 0.3 - 1.2 mg/dL   GFR, Estimated 60 (L) >60 mL/min    Comment: (NOTE) Calculated using the CKD-EPI Creatinine Equation (2021)    Anion gap 7 5 - 15    Comment: Performed at [facility], [address] [place],  [address]  Procalcitonin     Status: None   Collection Time: 01/02/22  6:40 AM  Result Value Ref Range   Procalcitonin 1.30 ng/mL    Comment:        Interpretation: PCT > 0.5 ng/mL and <= 2 ng/mL: Systemic infection (sepsis) is possible, but other conditions are known to elevate PCT as well. (NOTE)       Sepsis PCT Algorithm           Lower Respiratory Tract  Infection PCT Algorithm    ----------------------------     ----------------------------         PCT < 0.25 ng/mL                PCT < 0.10 ng/mL          Strongly encourage             Strongly discourage   discontinuation of antibiotics    initiation of antibiotics    ----------------------------     -----------------------------       PCT 0.25 - 0.50 ng/mL            PCT 0.10 - 0.25 ng/mL               OR       >80% decrease in PCT            Discourage initiation of                                            antibiotics      Encourage discontinuation           of antibiotics    ----------------------------     -----------------------------         PCT >= 0.50 ng/mL              PCT 0.26 - 0.50 ng/mL                AND       <80% decrease in PCT             Encourage initiation of                                             antibiotics       Encourage continuation           of antibiotics    ----------------------------     -----------------------------        PCT >= 0.50 ng/mL                  PCT > 0.50 ng/mL               AND         increase in PCT                  Strongly encourage                                      initiation of antibiotics    Strongly encourage escalation           of antibiotics                                     -----------------------------                                           PCT <= 0.25 ng/mL  OR                                        > 80% decrease in PCT                                      Discontinue / Do not initiate                                             antibiotics  Performed at [facility], [address] [place], [place] [address]   CBG monitoring, ED     Status: Abnormal   Collection Time: 01/02/22  7:54 AM  Result Value Ref Range   Glucose-Capillary 104 (H) 70 - 99 mg/dL    Comment: Glucose reference range applies only to samples taken after  fasting for at least 8 hours.  ECHOCARDIOGRAM COMPLETE     Status: None   Collection Time: 01/02/22 12:00 PM  Result Value Ref Range   Weight 5,760 oz   Height 67 in   BP 140/78 mmHg   Ao pk vel 1.66 m/s   AV Area VTI 3.21 cm2   AR max vel 3.30 cm2   AV Mean grad 7.0 mmHg   AV Peak grad 11.0 mmHg   Single Plane A2C EF 55.4 %   Single Plane A4C EF 68.6 %   Calc EF 62.9 %   AV Area mean vel 3.26 cm2   Area-P 1/2 3.93 cm2  Glucose, capillary     Status: Abnormal   Collection Time: 01/02/22  1:02 PM  Result Value Ref Range   Glucose-Capillary 130 (H) 70 - 99 mg/dL    Comment: Glucose reference range applies only to samples taken after fasting for at least 8 hours.  Glucose, capillary     Status: Abnormal   Collection Time: 01/02/22  4:47 PM  Result Value Ref Range   Glucose-Capillary 111 (H) 70 - 99 mg/dL    Comment: Glucose reference range applies only to samples taken after fasting for at least 8 hours.   Comment 1 Notify RN    Comment 2 Document in Chart   Glucose, capillary     Status: Abnormal   Collection Time: 01/02/22  9:31 PM  Result Value Ref Range   Glucose-Capillary 117 (H) 70 - 99 mg/dL    Comment: Glucose reference range applies only to samples taken after fasting for at least 8 hours.  Procalcitonin     Status: None   Collection Time: 01/03/22  4:20 AM  Result Value Ref Range   Procalcitonin 1.18 ng/mL    Comment:        Interpretation: PCT > 0.5 ng/mL and <= 2 ng/mL: Systemic infection (sepsis) is possible, but other conditions are known to elevate PCT as well. (NOTE)       Sepsis PCT Algorithm           Lower Respiratory Tract                                      Infection PCT Algorithm    ----------------------------     ----------------------------  PCT < 0.25 ng/mL                PCT < 0.10 ng/mL          Strongly encourage             Strongly discourage   discontinuation of antibiotics    initiation of antibiotics     ----------------------------     -----------------------------       PCT 0.25 - 0.50 ng/mL            PCT 0.10 - 0.25 ng/mL               OR       >80% decrease in PCT            Discourage initiation of                                            antibiotics      Encourage discontinuation           of antibiotics    ----------------------------     -----------------------------         PCT >= 0.50 ng/mL              PCT 0.26 - 0.50 ng/mL                AND       <80% decrease in PCT             Encourage initiation of                                             antibiotics       Encourage continuation           of antibiotics    ----------------------------     -----------------------------        PCT >= 0.50 ng/mL                  PCT > 0.50 ng/mL               AND         increase in PCT                  Strongly encourage                                      initiation of antibiotics    Strongly encourage escalation           of antibiotics                                     -----------------------------                                           PCT <= 0.25 ng/mL  OR                                        > 80% decrease in PCT                                      Discontinue / Do not initiate                                             antibiotics  Performed at [facility], [address] [place], [place] [address]   Glucose, capillary     Status: Abnormal   Collection Time: 01/03/22  8:16 AM  Result Value Ref Range   Glucose-Capillary 118 (H) 70 - 99 mg/dL    Comment: Glucose reference range applies only to samples taken after fasting for at least 8 hours.  Glucose, capillary     Status: Abnormal   Collection Time: 01/03/22 11:55 AM  Result Value Ref Range   Glucose-Capillary 108 (H) 70 - 99 mg/dL    Comment: Glucose reference range applies only to samples taken after fasting for at least 8 hours.   Comment 1  Notify RN    Comment 2 Document in Chart   Glucose, capillary     Status: Abnormal   Collection Time: 01/03/22  4:54 PM  Result Value Ref Range   Glucose-Capillary 102 (H) 70 - 99 mg/dL    Comment: Glucose reference range applies only to samples taken after fasting for at least 8 hours.   Comment 1 Notify RN    Comment 2 Document in Chart   Glucose, capillary     Status: Abnormal   Collection Time: 01/03/22  8:24 PM  Result Value Ref Range   Glucose-Capillary 118 (H) 70 - 99 mg/dL    Comment: Glucose reference range applies only to samples taken after fasting for at least 8 hours.  Procalcitonin     Status: None   Collection Time: 01/04/22  5:21 AM  Result Value Ref Range   Procalcitonin 0.50 ng/mL    Comment:        Interpretation: PCT (Procalcitonin) <= 0.5 ng/mL: Systemic infection (sepsis) is not likely. Local bacterial infection is possible. (NOTE)       Sepsis PCT Algorithm           Lower Respiratory Tract                                      Infection PCT Algorithm    ----------------------------     ----------------------------         PCT < 0.25 ng/mL                PCT < 0.10 ng/mL          Strongly encourage             Strongly discourage   discontinuation of antibiotics    initiation of antibiotics    ----------------------------     -----------------------------       PCT 0.25 - 0.50 ng/mL  PCT 0.10 - 0.25 ng/mL               OR       >80% decrease in PCT            Discourage initiation of                                            antibiotics      Encourage discontinuation           of antibiotics    ----------------------------     -----------------------------         PCT >= 0.50 ng/mL              PCT 0.26 - 0.50 ng/mL               AND        <80% decrease in PCT             Encourage initiation of                                             antibiotics       Encourage continuation           of antibiotics    ----------------------------      -----------------------------        PCT >= 0.50 ng/mL                  PCT > 0.50 ng/mL               AND         increase in PCT                  Strongly encourage                                      initiation of antibiotics    Strongly encourage escalation           of antibiotics                                     -----------------------------                                           PCT <= 0.25 ng/mL                                                 OR                                        > 80% decrease in PCT  Discontinue / Do not initiate                                             antibiotics  Performed at [facility], [address] [place], [place] [address]   Basic metabolic panel     Status: Abnormal   Collection Time: 01/04/22  5:21 AM  Result Value Ref Range   Sodium 137 135 - 145 mmol/L   Potassium 3.9 3.5 - 5.1 mmol/L   Chloride 104 98 - 111 mmol/L   CO2 26 22 - 32 mmol/L   Glucose, Bld 102 (H) 70 - 99 mg/dL    Comment: Glucose reference range applies only to samples taken after fasting for at least 8 hours.   BUN 19 6 - 20 mg/dL   Creatinine, Ser 1.33 (H) 0.61 - 1.24 mg/dL   Calcium 8.5 (L) 8.9 - 10.3 mg/dL   GFR, Estimated >60 >60 mL/min    Comment: (NOTE) Calculated using the CKD-EPI Creatinine Equation (2021)    Anion gap 7 5 - 15    Comment: Performed at [facility], [address] [place], [place] [address]  Glucose, capillary     Status: Abnormal   Collection Time: 01/04/22  7:59 AM  Result Value Ref Range   Glucose-Capillary 105 (H) 70 - 99 mg/dL    Comment: Glucose reference range applies only to samples taken after fasting for at least 8 hours.  Glucose, capillary     Status: Abnormal   Collection Time: 01/04/22 12:13 PM  Result Value Ref Range   Glucose-Capillary 116 (H) 70 - 99 mg/dL    Comment: Glucose reference range applies only to samples taken after fasting for at least 8  hours.  Resp Panel by RT-PCR (Flu A&B, Covid) Anterior Nasal Swab     Status: None   Collection Time: 01/25/22  9:45 PM   Specimen: Anterior Nasal Swab  Result Value Ref Range   SARS Coronavirus 2 by RT PCR NEGATIVE NEGATIVE    Comment: (NOTE) SARS-CoV-2 target nucleic acids are NOT DETECTED.  The SARS-CoV-2 RNA is generally detectable in upper respiratory specimens during the acute phase of infection. The lowest concentration of SARS-CoV-2 viral copies this assay can detect is 138 copies/mL. A negative result does not preclude SARS-Cov-2 infection and should not be used as the sole basis for treatment or other patient management decisions. A negative result may occur with  improper specimen collection/handling, submission of specimen other than nasopharyngeal swab, presence of viral mutation(s) within the areas targeted by this assay, and inadequate number of viral copies(<138 copies/mL). A negative result must be combined with clinical observations, patient history, and epidemiological information. The expected result is Negative.  Fact Sheet for Patients:  EntrepreneurPulse.com.au  Fact Sheet for Healthcare Providers:  IncredibleEmployment.be  This test is no t yet approved or cleared by the [place] FDA and  has been authorized for detection and/or diagnosis of SARS-CoV-2 by FDA under an Emergency Use Authorization (EUA). This EUA will remain  in effect (meaning this test can be used) for the duration of the COVID-19 declaration under Section 564(b)(1) of the Act, 21 U.S.C.section 360bbb-3(b)(1), unless the authorization is terminated  or revoked sooner.       Influenza A by PCR NEGATIVE NEGATIVE   Influenza B by PCR NEGATIVE NEGATIVE    Comment: (NOTE) The Xpert  Xpress SARS-CoV-2/FLU/RSV plus assay is intended as an aid in the diagnosis of influenza from Nasopharyngeal swab specimens and should not be used as a sole basis for  treatment. Nasal washings and aspirates are unacceptable for Xpert Xpress SARS-CoV-2/FLU/RSV testing.  Fact Sheet for Patients: EntrepreneurPulse.com.au  Fact Sheet for Healthcare Providers: IncredibleEmployment.be  This test is not yet approved or cleared by the [place] FDA and has been authorized for detection and/or diagnosis of SARS-CoV-2 by FDA under an Emergency Use Authorization (EUA). This EUA will remain in effect (meaning this test can be used) for the duration of the COVID-19 declaration under Section 564(b)(1) of the Act, 21 U.S.C. section 360bbb-3(b)(1), unless the authorization is terminated or revoked.  Performed at [facility], [address] [place], [place] [address]   Lactic acid, plasma     Status: Abnormal   Collection Time: 01/25/22  9:45 PM  Result Value Ref Range   Lactic Acid, Venous 2.2 (HH) 0.5 - 1.9 mmol/L    Comment: CRITICAL RESULT CALLED TO, READ BACK BY AND VERIFIED WITH CHRISTINA CARTER AT 2248 ON 01/25/22 BY SS Performed at [facility], [address] [place], [place] [address]   Comprehensive metabolic panel     Status: Abnormal   Collection Time: 01/25/22  9:45 PM  Result Value Ref Range   Sodium 138 135 - 145 mmol/L   Potassium 4.1 3.5 - 5.1 mmol/L   Chloride 104 98 - 111 mmol/L   CO2 24 22 - 32 mmol/L   Glucose, Bld 116 (H) 70 - 99 mg/dL    Comment: Glucose reference range applies only to samples taken after fasting for at least 8 hours.   BUN 12 6 - 20 mg/dL   Creatinine, Ser 1.30 (H) 0.61 - 1.24 mg/dL   Calcium 8.4 (L) 8.9 - 10.3 mg/dL   Total Protein 8.0 6.5 - 8.1 g/dL   Albumin 3.8 3.5 - 5.0 g/dL   AST 22 15 - 41 U/L   ALT 25 0 - 44 U/L   Alkaline Phosphatase 58 38 - 126 U/L   Total Bilirubin 0.8 0.3 - 1.2 mg/dL   GFR, Estimated >60 >60 mL/min    Comment: (NOTE) Calculated using the CKD-EPI Creatinine Equation (2021)    Anion gap 10 5 - 15    Comment: Performed  at [facility], [address] [place], [place] [address]  CBC with Differential     Status: None   Collection Time: 01/25/22  9:45 PM  Result Value Ref Range   WBC 8.4 4.0 - 10.5 K/uL   RBC 4.85 4.22 - 5.81 MIL/uL   Hemoglobin 14.4 13.0 - 17.0 g/dL   HCT 45.6 39.0 - 52.0 %   MCV 94.0 80.0 - 100.0 fL   MCH 29.7 26.0 - 34.0 pg   MCHC 31.6 30.0 - 36.0 g/dL   RDW 14.8 11.5 - 15.5 %   Platelets 204 150 - 400 K/uL   nRBC 0.0 0.0 - 0.2 %   Neutrophils Relative % 74 %   Neutro Abs 6.2 1.7 - 7.7 K/uL   Lymphocytes Relative 14 %   Lymphs Abs 1.2 0.7 - 4.0 K/uL   Monocytes Relative 10 %   Monocytes Absolute 0.8 0.1 - 1.0 K/uL   Eosinophils Relative 2 %   Eosinophils Absolute 0.1 0.0 - 0.5 K/uL   Basophils Relative 0 %   Basophils Absolute 0.0 0.0 - 0.1 K/uL   Immature Granulocytes 0 %   Abs  Immature Granulocytes 0.03 0.00 - 0.07 K/uL    Comment: Performed at [facility], [address] [place], [place] [address]  Protime-INR     Status: Abnormal   Collection Time: 01/25/22  9:45 PM  Result Value Ref Range   Prothrombin Time 16.6 (H) 11.4 - 15.2 seconds   INR 1.4 (H) 0.8 - 1.2    Comment: (NOTE) INR goal varies based on device and disease states. Performed at [facility], [address] [place], [place] [address]   APTT     Status: None   Collection Time: 01/25/22  9:45 PM  Result Value Ref Range   aPTT 27 24 - 36 seconds    Comment: Performed at [facility], [address] [place], [place] [address]  Brain natriuretic peptide     Status: None   Collection Time: 01/25/22  9:45 PM  Result Value Ref Range   B Natriuretic Peptide 35.5 0.0 - 100.0 pg/mL    Comment: Performed at [facility], [place], [place] [address]  Troponin I (High Sensitivity)     Status: Abnormal   Collection Time: 01/25/22  9:45 PM  Result Value Ref Range   Troponin I (High Sensitivity) 34 (H) <18 ng/L    Comment: (NOTE) Elevated  high sensitivity troponin I (hsTnI) values and significant  changes across serial measurements may suggest ACS but many other  chronic and acute conditions are known to elevate hsTnI results.  Refer to the "Links" section for chest pain algorithms and additional  guidance. Performed at [facility], [address] [place], [place] [address]   Procalcitonin     Status: None   Collection Time: 01/25/22  9:45 PM  Result Value Ref Range   Procalcitonin 0.10 ng/mL    Comment:        Interpretation: PCT (Procalcitonin) <= 0.5 ng/mL: Systemic infection (sepsis) is not likely. Local bacterial infection is possible. (NOTE)       Sepsis PCT Algorithm           Lower Respiratory Tract                                      Infection PCT Algorithm    ----------------------------     ----------------------------         PCT < 0.25 ng/mL                PCT < 0.10 ng/mL          Strongly encourage             Strongly discourage   discontinuation of antibiotics    initiation of antibiotics    ----------------------------     -----------------------------       PCT 0.25 - 0.50 ng/mL            PCT 0.10 - 0.25 ng/mL               OR       >80% decrease in PCT            Discourage initiation of                                            antibiotics      Encourage discontinuation  of antibiotics    ----------------------------     -----------------------------         PCT >= 0.50 ng/mL              PCT 0.26 - 0.50 ng/mL               AND        <80% decrease in PCT             Encourage initiation of                                             antibiotics       Encourage continuation           of antibiotics    ----------------------------     -----------------------------        PCT >= 0.50 ng/mL                  PCT > 0.50 ng/mL               AND         increase in PCT                  Strongly encourage                                      initiation of antibiotics     Strongly encourage escalation           of antibiotics                                     -----------------------------                                           PCT <= 0.25 ng/mL                                                 OR                                        > 80% decrease in PCT                                      Discontinue / Do not initiate                                             antibiotics  Performed at [facility], [address] [place], [place] [address]   Blood Culture (routine x 2)     Status: None   Collection Time: 01/25/22  9:55 PM   Specimen: BLOOD  Result Value Ref Range  Specimen Description BLOOD RIGHT FOREARM    Special Requests      BOTTLES DRAWN AEROBIC AND ANAEROBIC Blood Culture adequate volume   Culture      NO GROWTH 5 DAYS Performed at [facility], [address] [place], [place] [address]    Report Status 01/30/2022 FINAL   Urinalysis, Complete w Microscopic Anterior Nasal Swab     Status: Abnormal   Collection Time: 01/25/22 10:00 PM  Result Value Ref Range   Color, Urine STRAW (A) YELLOW   APPearance CLEAR (A) CLEAR   Specific Gravity, Urine 1.015 1.005 - 1.030   pH 6.0 5.0 - 8.0   Glucose, UA >=500 (A) NEGATIVE mg/dL   Hgb urine dipstick SMALL (A) NEGATIVE   Bilirubin Urine NEGATIVE NEGATIVE   Ketones, ur NEGATIVE NEGATIVE mg/dL   Protein, ur 30 (A) NEGATIVE mg/dL   Nitrite NEGATIVE NEGATIVE   Leukocytes,Ua NEGATIVE NEGATIVE   RBC / HPF 0-5 0 - 5 RBC/hpf   WBC, UA 0-5 0 - 5 WBC/hpf   Bacteria, UA NONE SEEN NONE SEEN   Squamous Epithelial / LPF 0-5 0 - 5    Comment: Performed at [facility], [address], [place], [place] [address]  Urine Culture     Status: Abnormal   Collection Time: 01/25/22 10:00 PM   Specimen: Urine, Random  Result Value Ref Range   Specimen Description      URINE, RANDOM Performed at [facility], [address] [place], [place] [address]    Special  Requests      NONE Performed at [facility], [address], [place], [place] [address]    Culture (A)     30,000 COLONIES/mL MULTIPLE SPECIES PRESENT, SUGGEST RECOLLECTION   Report Status 01/27/2022 FINAL   Blood Culture (routine x 2)     Status: None   Collection Time: 01/25/22 10:15 PM   Specimen: BLOOD  Result Value Ref Range   Specimen Description BLOOD RIGHT ARM    Special Requests      BOTTLES DRAWN AEROBIC AND ANAEROBIC Blood Culture results may not be optimal due to an inadequate volume of blood received in culture bottles   Culture      NO GROWTH 5 DAYS Performed at [facility], [address] [place], [place] [address]    Report Status 01/30/2022 FINAL   Lactic acid, plasma     Status: Abnormal   Collection Time: 01/26/22 12:45 AM  Result Value Ref Range   Lactic Acid, Venous 2.1 (HH) 0.5 - 1.9 mmol/L    Comment: CRITICAL VALUE NOTED. VALUE IS CONSISTENT WITH PREVIOUSLY REPORTED/CALLED VALUE RH Performed at [facility], [place], [place] [address]   Troponin I (High Sensitivity)     Status: Abnormal   Collection Time: 01/26/22  1:41 AM  Result Value Ref Range   Troponin I (High Sensitivity) 41 (H) <18 ng/L    Comment: (NOTE) Elevated high sensitivity troponin I (hsTnI) values and significant  changes across serial measurements may suggest ACS but many other  chronic and acute conditions are known to elevate hsTnI results.  Refer to the "Links" section for chest pain algorithms and additional  guidance. Performed at [facility], [place], [place] [address]   Heparin level (unfractionated)     Status: Abnormal   Collection Time: 01/26/22  1:41 AM  Result Value Ref Range   Heparin Unfractionated >1.10 (H) 0.30 - 0.70 IU/mL  Comment: (NOTE) The clinical reportable range upper limit is being lowered to >1.10 to align with the FDA approved guidance for the current laboratory assay.  If  heparin results are below expected values, and patient dosage has  been confirmed, suggest follow up testing of antithrombin III levels. Performed at [facility], [address] [place], [place] [address]   Protime-INR     Status: Abnormal   Collection Time: 01/26/22  1:41 AM  Result Value Ref Range   Prothrombin Time 17.8 (H) 11.4 - 15.2 seconds   INR 1.5 (H) 0.8 - 1.2    Comment: (NOTE) INR goal varies based on device and disease states. Performed at [facility] [facility], [address] [place], [place] [address]   APTT     Status: Abnormal   Collection Time: 01/26/22  1:41 AM  Result Value Ref Range   aPTT 83 (H) 24 - 36 seconds    Comment:        IF BASELINE aPTT IS ELEVATED, SUGGEST PATIENT RISK ASSESSMENT BE USED TO DETERMINE APPROPRIATE ANTICOAGULANT THERAPY. Performed at [facility], [address] [place], [place] [address]   Lactic acid, plasma     Status: None   Collection Time: 01/26/22  6:20 AM  Result Value Ref Range   Lactic Acid, Venous 1.4 0.5 - 1.9 mmol/L    Comment: Performed at [facility], [address] [place], [place] [address]  Magnesium     Status: None   Collection Time: 01/26/22  6:20 AM  Result Value Ref Range   Magnesium 1.7 1.7 - 2.4 mg/dL    Comment: Performed at [facility], [address] [place], [place] [address]  Basic metabolic panel     Status: Abnormal   Collection Time: 01/26/22  6:20 AM  Result Value Ref Range   Sodium 136 135 - 145 mmol/L   Potassium 4.2 3.5 - 5.1 mmol/L   Chloride 103 98 - 111 mmol/L   CO2 23 22 - 32 mmol/L   Glucose, Bld 118 (H) 70 - 99 mg/dL    Comment: Glucose reference range applies only to samples taken after fasting for at least 8 hours.   BUN 12 6 - 20 mg/dL   Creatinine, Ser 1.60 (H) 0.61 - 1.24 mg/dL   Calcium 8.6 (L) 8.9 - 10.3 mg/dL   GFR, Estimated 52 (L) >60 mL/min    Comment: (NOTE) Calculated using the CKD-EPI Creatinine Equation (2021)     Anion gap 10 5 - 15    Comment: Performed at [facility], [address] [place], [place] [address]  APTT     Status: Abnormal   Collection Time: 01/26/22  6:20 AM  Result Value Ref Range   aPTT 45 (H) 24 - 36 seconds    Comment:        IF BASELINE aPTT IS ELEVATED, SUGGEST PATIENT RISK ASSESSMENT BE USED TO DETERMINE APPROPRIATE ANTICOAGULANT THERAPY. Performed at [facility], [name], [place] [address]   Heparin level (unfractionated)     Status: Abnormal   Collection Time: 01/26/22  6:20 AM  Result Value Ref Range   Heparin Unfractionated >1.10 (H) 0.30 - 0.70 IU/mL    Comment: (NOTE) The clinical reportable range upper limit is being lowered to >1.10 to align with the FDA approved guidance for the current laboratory assay.  If heparin results are below expected values, and patient dosage has  been confirmed, suggest follow up testing of  antithrombin III levels. Performed at [facility], [address] [place], [place] [address]   Hemoglobin A1c     Status: Abnormal   Collection Time: 01/26/22  6:20 AM  Result Value Ref Range   Hgb A1c MFr Bld 6.2 (H) 4.8 - 5.6 %    Comment: (NOTE) Pre diabetes:          5.7%-6.4%  Diabetes:              >6.4%  Glycemic control for   <7.0% adults with diabetes    Mean Plasma Glucose 131.24 mg/dL    Comment: Performed at [facility] [address], [place], [place] [address]  Respiratory (~20 pathogens) panel by PCR     Status: None   Collection Time: 01/26/22  9:51 AM   Specimen: Nasopharyngeal Swab; Respiratory  Result Value Ref Range   Adenovirus NOT DETECTED NOT DETECTED   Coronavirus 229E NOT DETECTED NOT DETECTED    Comment: (NOTE) The Coronavirus on the Respiratory Panel, DOES NOT test for the novel  Coronavirus (2019 nCoV)    Coronavirus HKU1 NOT DETECTED NOT DETECTED   Coronavirus NL63 NOT DETECTED NOT DETECTED   Coronavirus OC43 NOT DETECTED NOT DETECTED    Metapneumovirus NOT DETECTED NOT DETECTED   Rhinovirus / Enterovirus NOT DETECTED NOT DETECTED   Influenza A NOT DETECTED NOT DETECTED   Influenza B NOT DETECTED NOT DETECTED   Parainfluenza Virus 1 NOT DETECTED NOT DETECTED   Parainfluenza Virus 2 NOT DETECTED NOT DETECTED   Parainfluenza Virus 3 NOT DETECTED NOT DETECTED   Parainfluenza Virus 4 NOT DETECTED NOT DETECTED   Respiratory Syncytial Virus NOT DETECTED NOT DETECTED   Bordetella pertussis NOT DETECTED NOT DETECTED   Bordetella Parapertussis NOT DETECTED NOT DETECTED   Chlamydophila pneumoniae NOT DETECTED NOT DETECTED   Mycoplasma pneumoniae NOT DETECTED NOT DETECTED    Comment: Performed at [facility], [place] [address], [place], [place] [address]  CBG monitoring, ED     Status: None   Collection Time: 01/26/22 12:16 PM  Result Value Ref Range   Glucose-Capillary 99 70 - 99 mg/dL    Comment: Glucose reference range applies only to samples taken after fasting for at least 8 hours.  APTT     Status: Abnormal   Collection Time: 01/26/22  2:00 PM  Result Value Ref Range   aPTT 66 (H) 24 - 36 seconds    Comment:        IF BASELINE aPTT IS ELEVATED, SUGGEST PATIENT RISK ASSESSMENT BE USED TO DETERMINE APPROPRIATE ANTICOAGULANT THERAPY. Performed at [facility], [address] [place], [place] [address]   Glucose, capillary     Status: Abnormal   Collection Time: 01/26/22  4:23 PM  Result Value Ref Range   Glucose-Capillary 130 (H) 70 - 99 mg/dL    Comment: Glucose reference range applies only to samples taken after fasting for at least 8 hours.  APTT     Status: Abnormal   Collection Time: 01/26/22  7:47 PM  Result Value Ref Range   aPTT 58 (H) 24 - 36 seconds    Comment:        IF BASELINE aPTT IS ELEVATED, SUGGEST PATIENT RISK ASSESSMENT BE USED TO DETERMINE APPROPRIATE ANTICOAGULANT THERAPY. Performed at [facility], [address] [place], [place] [address]   Basic metabolic panel      Status: Abnormal   Collection Time: 01/27/22  5:15 AM  Result Value Ref  Range   Sodium 134 (L) 135 - 145 mmol/L   Potassium 4.0 3.5 - 5.1 mmol/L   Chloride 104 98 - 111 mmol/L   CO2 19 (L) 22 - 32 mmol/L   Glucose, Bld 111 (H) 70 - 99 mg/dL    Comment: Glucose reference range applies only to samples taken after fasting for at least 8 hours.   BUN 13 6 - 20 mg/dL   Creatinine, Ser 1.39 (H) 0.61 - 1.24 mg/dL   Calcium 8.2 (L) 8.9 - 10.3 mg/dL   GFR, Estimated >60 >60 mL/min    Comment: (NOTE) Calculated using the CKD-EPI Creatinine Equation (2021)    Anion gap 11 5 - 15    Comment: Performed at [facility], [address] [place], [place] [address]  Phosphorus     Status: None   Collection Time: 01/27/22  5:15 AM  Result Value Ref Range   Phosphorus 3.1 2.5 - 4.6 mg/dL    Comment: Performed at [facility], [address] [place], [place] [address]  Magnesium     Status: None   Collection Time: 01/27/22  5:15 AM  Result Value Ref Range   Magnesium 2.2 1.7 - 2.4 mg/dL    Comment: Performed at [facility], [address] [place], [place] [address]  CBC     Status: None   Collection Time: 01/27/22  5:15 AM  Result Value Ref Range   WBC 5.4 4.0 - 10.5 K/uL   RBC 4.81 4.22 - 5.81 MIL/uL   Hemoglobin 14.2 13.0 - 17.0 g/dL   HCT 45.6 39.0 - 52.0 %   MCV 94.8 80.0 - 100.0 fL   MCH 29.5 26.0 - 34.0 pg   MCHC 31.1 30.0 - 36.0 g/dL   RDW 15.2 11.5 - 15.5 %   Platelets 166 150 - 400 K/uL   nRBC 0.0 0.0 - 0.2 %    Comment: Performed at [facility], [place], [place] [address]  Procalcitonin - Baseline     Status: None   Collection Time: 01/27/22  5:15 AM  Result Value Ref Range   Procalcitonin 0.95 ng/mL    Comment:        Interpretation: PCT > 0.5 ng/mL and <= 2 ng/mL: Systemic infection (sepsis) is possible, but other conditions are known to elevate PCT as well. (NOTE)       Sepsis PCT Algorithm           Lower  Respiratory Tract                                      Infection PCT Algorithm    ----------------------------     ----------------------------         PCT < 0.25 ng/mL                PCT < 0.10 ng/mL          Strongly encourage             Strongly discourage   discontinuation of antibiotics    initiation of antibiotics    ----------------------------     -----------------------------       PCT 0.25 - 0.50 ng/mL            PCT 0.10 - 0.25 ng/mL               OR       >  80% decrease in PCT            Discourage initiation of                                            antibiotics      Encourage discontinuation           of antibiotics    ----------------------------     -----------------------------         PCT >= 0.50 ng/mL              PCT 0.26 - 0.50 ng/mL                AND       <80% decrease in PCT             Encourage initiation of                                             antibiotics       Encourage continuation           of antibiotics    ----------------------------     -----------------------------        PCT >= 0.50 ng/mL                  PCT > 0.50 ng/mL               AND         increase in PCT                  Strongly encourage                                      initiation of antibiotics    Strongly encourage escalation           of antibiotics                                     -----------------------------                                           PCT <= 0.25 ng/mL                                                 OR                                        > 80% decrease in PCT                                      Discontinue / Do not initiate  antibiotics  Performed at [facility], [place], [place] [address]   Heparin level (unfractionated)     Status: None   Collection Time: 01/27/22  6:04 AM  Result Value Ref Range   Heparin Unfractionated 0.56 0.30 - 0.70 IU/mL    Comment:  (NOTE) The clinical reportable range upper limit is being lowered to >1.10 to align with the FDA approved guidance for the current laboratory assay.  If heparin results are below expected values, and patient dosage has  been confirmed, suggest follow up testing of antithrombin III levels. Performed at [facility], [address] [place], [place] [address]   APTT     Status: Abnormal   Collection Time: 01/27/22  6:04 AM  Result Value Ref Range   aPTT 70 (H) 24 - 36 seconds    Comment:        IF BASELINE aPTT IS ELEVATED, SUGGEST PATIENT RISK ASSESSMENT BE USED TO DETERMINE APPROPRIATE ANTICOAGULANT THERAPY. Performed at [facility], [address] [place], [place] [address]   Glucose, capillary     Status: Abnormal   Collection Time: 01/27/22  9:18 AM  Result Value Ref Range   Glucose-Capillary 150 (H) 70 - 99 mg/dL    Comment: Glucose reference range applies only to samples taken after fasting for at least 8 hours.  MRSA Next Gen by PCR, Nasal     Status: None   Collection Time: 01/27/22 10:34 AM  Result Value Ref Range   MRSA by PCR Next Gen NOT DETECTED NOT DETECTED    Comment: (NOTE) The GeneXpert MRSA Assay (FDA approved for NASAL specimens only), is one component of a comprehensive MRSA colonization surveillance program. It is not intended to diagnose MRSA infection nor to guide or monitor treatment for MRSA infections. Test performance is not FDA approved in patients less than 55 years old. Performed at [facility], [address] [place], [place] [address]   Glucose, capillary     Status: Abnormal   Collection Time: 01/27/22 11:25 AM  Result Value Ref Range   Glucose-Capillary 148 (H) 70 - 99 mg/dL    Comment: Glucose reference range applies only to samples taken after fasting for at least 8 hours.  APTT     Status: Abnormal   Collection Time: 01/27/22 12:28 PM  Result Value Ref Range   aPTT 47 (H) 24 - 36 seconds    Comment:         IF BASELINE aPTT IS ELEVATED, SUGGEST PATIENT RISK ASSESSMENT BE USED TO DETERMINE APPROPRIATE ANTICOAGULANT THERAPY. Performed at [facility] [place], [place] [address]   Heparin level (unfractionated)     Status: None   Collection Time: 01/27/22 12:28 PM  Result Value Ref Range   Heparin Unfractionated 0.37 0.30 - 0.70 IU/mL    Comment: (NOTE) The clinical reportable range upper limit is being lowered to >1.10 to align with the FDA approved guidance for the current laboratory assay.  If heparin results are below expected values, and patient dosage has  been confirmed, suggest follow up testing of antithrombin III levels. Performed at [facility], [address] [place], [place] [address]   Glucose, capillary     Status: Abnormal   Collection Time: 01/27/22  4:53 PM  Result Value Ref Range   Glucose-Capillary 117 (H) 70 - 99 mg/dL    Comment: Glucose reference range applies only to samples taken after fasting for at least 8 hours.  Heparin level (unfractionated)     Status: None   Collection Time: 01/27/22  8:12 PM  Result Value Ref Range   Heparin Unfractionated 0.51 0.30 - 0.70 IU/mL    Comment: (NOTE) The clinical reportable range upper limit is being lowered to >1.10 to align with the FDA approved guidance for the current laboratory assay.  If heparin results are below expected values, and patient dosage has  been confirmed, suggest follow up testing of antithrombin III levels. Performed at [facility], [address] [place], [place] [address]   APTT     Status: Abnormal   Collection Time: 01/27/22  8:12 PM  Result Value Ref Range   aPTT 66 (H) 24 - 36 seconds    Comment:        IF BASELINE aPTT IS ELEVATED, SUGGEST PATIENT RISK ASSESSMENT BE USED TO DETERMINE APPROPRIATE ANTICOAGULANT THERAPY. Performed at [facility], [address] [place], [place] [address]   Procalcitonin     Status:  None   Collection Time: 01/28/22  2:21 AM  Result Value Ref Range   Procalcitonin 0.61 ng/mL    Comment:        Interpretation: PCT > 0.5 ng/mL and <= 2 ng/mL: Systemic infection (sepsis) is possible, but other conditions are known to elevate PCT as well. (NOTE)       Sepsis PCT Algorithm           Lower Respiratory Tract                                      Infection PCT Algorithm    ----------------------------     ----------------------------         PCT < 0.25 ng/mL                PCT < 0.10 ng/mL          Strongly encourage             Strongly discourage   discontinuation of antibiotics    initiation of antibiotics    ----------------------------     -----------------------------       PCT 0.25 - 0.50 ng/mL            PCT 0.10 - 0.25 ng/mL               OR       >80% decrease in PCT            Discourage initiation of                                            antibiotics      Encourage discontinuation           of antibiotics    ----------------------------     -----------------------------         PCT >= 0.50 ng/mL              PCT 0.26 - 0.50 ng/mL                AND       <80% decrease in PCT             Encourage initiation of  antibiotics       Encourage continuation           of antibiotics    ----------------------------     -----------------------------        PCT >= 0.50 ng/mL                  PCT > 0.50 ng/mL               AND         increase in PCT                  Strongly encourage                                      initiation of antibiotics    Strongly encourage escalation           of antibiotics                                     -----------------------------                                           PCT <= 0.25 ng/mL                                                 OR                                        > 80% decrease in PCT                                      Discontinue / Do not initiate                                              antibiotics  Performed at [facility], [address] [place], [place] [address]   Brain natriuretic peptide     Status: None   Collection Time: 01/28/22  2:21 AM  Result Value Ref Range   B Natriuretic Peptide 14.6 0.0 - 100.0 pg/mL    Comment: Performed at [facility], [address] [place], [place] [address]  Renal function panel     Status: Abnormal   Collection Time: 01/28/22  2:21 AM  Result Value Ref Range   Sodium 136 135 - 145 mmol/L   Potassium 3.8 3.5 - 5.1 mmol/L   Chloride 107 98 - 111 mmol/L   CO2 23 22 - 32 mmol/L   Glucose, Bld 122 (H) 70 - 99 mg/dL    Comment: Glucose reference range applies only to samples taken after fasting for at least 8 hours.   BUN 17 6 - 20 mg/dL   Creatinine, Ser 1.36 (H) 0.61 - 1.24 mg/dL   Calcium 8.3 (L) 8.9 -  10.3 mg/dL   Phosphorus 3.5 2.5 - 4.6 mg/dL   Albumin 3.3 (L) 3.5 - 5.0 g/dL   GFR, Estimated >80 >65 mL/min    Comment: (NOTE) Calculated using the CKD-EPI Creatinine Equation (2021)    Anion gap 6 5 - 15    Comment: Performed at [facility], [address] [place], [place] [address]  Magnesium     Status: None   Collection Time: 01/28/22  2:21 AM  Result Value Ref Range   Magnesium 2.2 1.7 - 2.4 mg/dL    Comment: Performed at [facility], [address] [place], [place] [address]  CBC     Status: None   Collection Time: 01/28/22  2:21 AM  Result Value Ref Range   WBC 4.8 4.0 - 10.5 K/uL   RBC 4.67 4.22 - 5.81 MIL/uL   Hemoglobin 13.8 13.0 - 17.0 g/dL   HCT 43.0 56.2 - 82.7 %   MCV 92.7 80.0 - 100.0 fL   MCH 29.6 26.0 - 34.0 pg   MCHC 31.9 30.0 - 36.0 g/dL   RDW 40.7 18.4 - 51.8 %   Platelets 180 150 - 400 K/uL   nRBC 0.0 0.0 - 0.2 %    Comment: Performed at [facility], [address] [place], [place] [address]  Heparin level (unfractionated)     Status: None   Collection Time: 01/28/22  2:21 AM  Result Value Ref Range    Heparin Unfractionated 0.49 0.30 - 0.70 IU/mL    Comment: (NOTE) The clinical reportable range upper limit is being lowered to >1.10 to align with the FDA approved guidance for the current laboratory assay.  If heparin results are below expected values, and patient dosage has  been confirmed, suggest follow up testing of antithrombin III levels. Performed at [facility], [address] [place], [place] [address]   Glucose, capillary     Status: Abnormal   Collection Time: 01/28/22  8:39 AM  Result Value Ref Range   Glucose-Capillary 119 (H) 70 - 99 mg/dL    Comment: Glucose reference range applies only to samples taken after fasting for at least 8 hours.  Glucose, capillary     Status: Abnormal   Collection Time: 01/28/22 11:11 AM  Result Value Ref Range   Glucose-Capillary 110 (H) 70 - 99 mg/dL    Comment: Glucose reference range applies only to samples taken after fasting for at least 8 hours.  Comp Met (CMET)     Status: Abnormal   Collection Time: 02/03/22  8:57 AM  Result Value Ref Range   Glucose 116 (H) 70 - 99 mg/dL   BUN 12 6 - 24 mg/dL   Creatinine, Ser 3.33 (H) 0.76 - 1.27 mg/dL   eGFR 63 >93 TW/QHB/5.72   BUN/Creatinine Ratio 9 9 - 20   Sodium 142 134 - 144 mmol/L   Potassium 4.4 3.5 - 5.2 mmol/L   Chloride 105 96 - 106 mmol/L   CO2 18 (L) 20 - 29 mmol/L   Calcium 9.3 8.7 - 10.2 mg/dL   Total Protein 8.0 6.0 - 8.5 g/dL   Albumin 4.2 3.8 - 4.9 g/dL   Globulin, Total 3.8 1.5 - 4.5 g/dL   Albumin/Globulin Ratio 1.1 (L) 1.2 - 2.2   Bilirubin Total 0.4 0.0 - 1.2 mg/dL   Alkaline Phosphatase 63 44 - 121 IU/L   AST 21 0 - 40 IU/L   ALT 27 0 - 44 IU/L  HgB A1c  Status: Abnormal   Collection Time: 02/03/22  8:57 AM  Result Value Ref Range   Hgb A1c MFr Bld 6.4 (H) 4.8 - 5.6 %    Comment:          Prediabetes: 5.7 - 6.4          Diabetes: >6.4          Glycemic control for adults with diabetes: <7.0    Est. average glucose Bld gHb Est-mCnc 137  mg/dL  CBC w/Diff     Status: None   Collection Time: 02/03/22  8:57 AM  Result Value Ref Range   WBC 4.0 3.4 - 10.8 x10E3/uL   RBC 4.95 4.14 - 5.80 x10E6/uL   Hemoglobin 14.8 13.0 - 17.7 g/dL   Hematocrit 45.1 37.5 - 51.0 %   MCV 91 79 - 97 fL   MCH 29.9 26.6 - 33.0 pg   MCHC 32.8 31.5 - 35.7 g/dL   RDW 13.8 11.6 - 15.4 %   Platelets 293 150 - 450 x10E3/uL   Neutrophils 35 Not Estab. %   Lymphs 43 Not Estab. %   Monocytes 12 Not Estab. %   Eos 8 Not Estab. %   Basos 1 Not Estab. %   Neutrophils Absolute 1.4 1.4 - 7.0 x10E3/uL   Lymphocytes Absolute 1.7 0.7 - 3.1 x10E3/uL   Monocytes Absolute 0.5 0.1 - 0.9 x10E3/uL   EOS (ABSOLUTE) 0.3 0.0 - 0.4 x10E3/uL   Basophils Absolute 0.0 0.0 - 0.2 x10E3/uL   Immature Granulocytes 1 Not Estab. %   Immature Grans (Abs) 0.0 0.0 - 0.1 x10E3/uL  Magnesium     Status: None   Collection Time: 02/03/22  8:57 AM  Result Value Ref Range   Magnesium 1.8 1.6 - 2.3 mg/dL    Comment: **Verified by repeat analysis**  Factor 5 leiden     Status: None   Collection Time: 02/17/22 11:30 AM  Result Value Ref Range   Recommendations-F5LEID: Comment     Comment: (NOTE) Result: c.1601G>A (p.Arg534Gln) - Not Detected This result is not associated with an increased risk for venous thromboembolism. See Additional Clinical Information and Comments. Additional Clinical Information: Venous thromboembolism is a multifactorial disease influenced by genetic, environmental, and circumstantial risk factors. The c.1601G>A (p. Arg534Gln) variant in the F5 gene, commonly referred to as Factor V Leiden, is a genetic risk factor for venous thromboembolism. Heterozygous carriers of this variant have a 6- to 8- fold increased risk for venous thromboembolism. Individuals homozygous for this variant (ie, with a copy of the variant on each chromosome) have an approximately 80-fold increased risk for venous thromboembolism. Individuals who carry both a c.*97G>A variant in  the F2 gene and Factor V Leiden have an approximately 20-fold increased risk for venous thromboembolism. Risks are likely to be even higher in more complex genotype combinations in volving the F2 c.*97G>A variant and Factor V Leiden (PMID: 32202542). Additional risk factors include but are not limited to: deficiency of protein C, protein S, or antithrombin III, age, male sex, personal or family history of deep vein thromboembolism, smoking, surgery, prolonged immobilization, malignant neoplasm, tamoxifen treatment, raloxifene treatment, oral contraceptive use, hormone replacement therapy, and pregnancy. Management of thrombotic risk and thrombotic events should follow established guidelines and fit the clinical circumstance. This result cannot predict the occurrence or recurrence of a thrombotic event. Comment: Genetic counseling is recommended to discuss the potential clinical implications of positive results, as well as recommendations for testing family members. Genetic Coordinators are available for  health care providers to discuss results at [phone]). Test Details: Variant Analyzed: c.1601G>A (p. Arg534Gln), referred to as Fact or V Leiden Methods/Limitations: DNA analysis of the F5 gene (NM_000130.5) was performed by PCR amplification followed by restriction enzyme analysis. The diagnostic sensitivity is >99%. Results must be combined with clinical information for the most accurate interpretation. Molecular- based testing is highly accurate, but as in any laboratory test, diagnostic errors may occur. False positive or false negative results may occur for reasons that include genetic variants, blood transfusions, bone marrow transplantation, somatic or tissue-specific mosaicism, mislabeled samples, or erroneous representation of family relationships. This test was developed and its performance characteristics determined by [facility]. It has not been cleared or  approved by the Food and Drug Administration. References: Dewitt Hoes [facility], Valentina Lucks [facility]; [facility] Professional Practice and Guidelines Committee. Addendum: [employer] consensus statement on fac tor V Leiden mutation testing. Genet Med. 2021 Mar 5. doi: 54.2370/C30172-091- 01108-x. PMID: 06816619. Cherrie Gauze. Factor V Leiden Thrombophilia. 1999 May 14 (Updated 2018 Jan 4). In: Bufford Buttner, Ardinger HH, Pagon RA, et al., editors. GeneReviews(R) (Internet). [address] ([place]): [place], [place]; 6940-9828. Available from: [email or web address]://harris-mcgee.org/ Nita Sickle, Blanca Friend, Alvie Heidelberg CS; [employer] Laboratory Quality Assurance Committee. Venous thromboembolism laboratory testing (factor V Leiden and factor II c.*97G>A), 2018 update: a technical standard of the [employer] of [employer] and [employer] ([facility]). Genet Med. 2018 Dec;20(12):1489-1498. doi: 10.1038/s41436-626 504 8960-z. Epub 2018 Oct 5. PMID: 67519824.    Reviewed By: Lowella Dell, PhD     Comment: (NOTE) Performed At: [facility] [address] [place], [place] [id number] Maurine Simmering MDPhD [id number]   Urinalysis, Routine w reflex microscopic     Status: Abnormal   Collection Time: 02/20/22  1:54 PM  Result Value Ref Range   Color, Urine YELLOW (A) YELLOW   APPearance CLEAR (A) CLEAR   Specific Gravity, Urine 1.021 1.005 - 1.030   pH 5.0 5.0 - 8.0   Glucose, UA NEGATIVE NEGATIVE mg/dL   Hgb urine dipstick SMALL (A) NEGATIVE   Bilirubin Urine NEGATIVE NEGATIVE   Ketones, ur NEGATIVE NEGATIVE mg/dL   Protein, ur 071 (A) NEGATIVE mg/dL   Nitrite NEGATIVE NEGATIVE   Leukocytes,Ua NEGATIVE NEGATIVE   RBC / HPF 0-5 0 - 5 RBC/hpf   WBC, UA 0-5 0 - 5 WBC/hpf   Bacteria, UA NONE SEEN NONE SEEN   Squamous Epithelial / LPF 0-5 0 - 5   Mucus PRESENT     Comment: Performed at [facility], [address]  [place], [place] [address]  Comprehensive metabolic panel     Status: Abnormal   Collection Time: 02/20/22  1:57 PM  Result Value Ref Range   Sodium 136 135 - 145 mmol/L   Potassium 3.9 3.5 - 5.1 mmol/L   Chloride 102 98 - 111 mmol/L   CO2 25 22 - 32 mmol/L   Glucose, Bld 123 (H) 70 - 99 mg/dL    Comment: Glucose reference range applies only to samples taken after fasting for at least 8 hours.   BUN 17 6 - 20 mg/dL   Creatinine, Ser 9.98 (H) 0.61 - 1.24 mg/dL   Calcium 9.3 8.9 - 00.1 mg/dL   Total Protein 8.7 (H) 6.5 - 8.1 g/dL   Albumin 4.1 3.5 - 5.0 g/dL   AST 20 15 - 41 U/L   ALT 24 0 - 44 U/L  Alkaline Phosphatase 59 38 - 126 U/L   Total Bilirubin 1.2 0.3 - 1.2 mg/dL   GFR, Estimated 49 (L) >60 mL/min    Comment: (NOTE) Calculated using the CKD-EPI Creatinine Equation (2021)    Anion gap 9 5 - 15    Comment: Performed at [facility], [address] [place], [place] [address]  Lactic acid, plasma     Status: None   Collection Time: 02/20/22  1:57 PM  Result Value Ref Range   Lactic Acid, Venous 1.6 0.5 - 1.9 mmol/L    Comment: Performed at [facility], [address] [place], [place] [address]  CBC with Differential     Status: None   Collection Time: 02/20/22  1:57 PM  Result Value Ref Range   WBC 8.8 4.0 - 10.5 K/uL   RBC 5.44 4.22 - 5.81 MIL/uL   Hemoglobin 15.9 13.0 - 17.0 g/dL   HCT 50.3 39.0 - 52.0 %   MCV 92.5 80.0 - 100.0 fL   MCH 29.2 26.0 - 34.0 pg   MCHC 31.6 30.0 - 36.0 g/dL   RDW 14.6 11.5 - 15.5 %   Platelets 270 150 - 400 K/uL   nRBC 0.0 0.0 - 0.2 %   Neutrophils Relative % 75 %   Neutro Abs 6.5 1.7 - 7.7 K/uL   Lymphocytes Relative 17 %   Lymphs Abs 1.5 0.7 - 4.0 K/uL   Monocytes Relative 8 %   Monocytes Absolute 0.7 0.1 - 1.0 K/uL   Eosinophils Relative 0 %   Eosinophils Absolute 0.0 0.0 - 0.5 K/uL   Basophils Relative 0 %   Basophils Absolute 0.0 0.0 - 0.1 K/uL   Immature Granulocytes 0 %   Abs Immature Granulocytes 0.02  0.00 - 0.07 K/uL    Comment: Performed at [facility], [address] [place], [place] [address]  Protime-INR     Status: Abnormal   Collection Time: 02/20/22  1:57 PM  Result Value Ref Range   Prothrombin Time 18.2 (H) 11.4 - 15.2 seconds   INR 1.5 (H) 0.8 - 1.2    Comment: (NOTE) INR goal varies based on device and disease states. Performed at [facility], [address] [place], [place] [address]   Culture, blood (Routine x 2)     Status: None   Collection Time: 02/20/22  1:57 PM   Specimen: BLOOD  Result Value Ref Range   Specimen Description BLOOD LEFT ANTECUBITAL    Special Requests      BOTTLES DRAWN AEROBIC AND ANAEROBIC Blood Culture adequate volume   Culture      NO GROWTH 6 DAYS Performed at [facility], [address] [place], [place] [address]    Report Status 02/26/2022 FINAL   Culture, blood (Routine x 2)     Status: None   Collection Time: 02/20/22  2:10 PM   Specimen: BLOOD  Result Value Ref Range   Specimen Description BLOOD BLOOD RIGHT FOREARM    Special Requests      BOTTLES DRAWN AEROBIC AND ANAEROBIC Blood Culture adequate volume   Culture      NO GROWTH 6 DAYS Performed at [facility], [address], [place], [place] [address]    Report Status 02/26/2022 FINAL   Procalcitonin - Baseline     Status: None   Collection Time: 02/20/22  2:44 PM  Result Value Ref Range   Procalcitonin 0.14 ng/mL    Comment:  Interpretation: PCT (Procalcitonin) <= 0.5 ng/mL: Systemic infection (sepsis) is not likely. Local bacterial infection is possible. (NOTE)       Sepsis PCT Algorithm           Lower Respiratory Tract                                      Infection PCT Algorithm    ----------------------------     ----------------------------         PCT < 0.25 ng/mL                PCT < 0.10 ng/mL          Strongly encourage             Strongly discourage   discontinuation of antibiotics    initiation of  antibiotics    ----------------------------     -----------------------------       PCT 0.25 - 0.50 ng/mL            PCT 0.10 - 0.25 ng/mL               OR       >80% decrease in PCT            Discourage initiation of                                            antibiotics      Encourage discontinuation           of antibiotics    ----------------------------     -----------------------------         PCT >= 0.50 ng/mL              PCT 0.26 - 0.50 ng/mL               AND        <80% decrease in PCT             Encourage initiation of                                             antibiotics       Encourage continuation           of antibiotics    ----------------------------     -----------------------------        PCT >= 0.50 ng/mL                  PCT > 0.50 ng/mL               AND         increase in PCT                  Strongly encourage                                      initiation of antibiotics    Strongly encourage escalation           of antibiotics                                     -----------------------------  PCT <= 0.25 ng/mL                                                 OR                                        > 80% decrease in PCT                                      Discontinue / Do not initiate                                             antibiotics  Performed at [facility], [address] [place], [place] [address]   Brain natriuretic peptide     Status: None   Collection Time: 02/20/22  2:44 PM  Result Value Ref Range   B Natriuretic Peptide 94.4 0.0 - 100.0 pg/mL    Comment: Performed at [facility], [address] [place], [place] [address]  Respiratory (~20 pathogens) panel by PCR     Status: None   Collection Time: 02/20/22  4:29 PM   Specimen: Nasopharyngeal Swab; Respiratory  Result Value Ref Range   Adenovirus NOT DETECTED NOT DETECTED   Coronavirus 229E NOT DETECTED NOT  DETECTED    Comment: (NOTE) The Coronavirus on the Respiratory Panel, DOES NOT test for the novel  Coronavirus (2019 nCoV)    Coronavirus HKU1 NOT DETECTED NOT DETECTED   Coronavirus NL63 NOT DETECTED NOT DETECTED   Coronavirus OC43 NOT DETECTED NOT DETECTED   Metapneumovirus NOT DETECTED NOT DETECTED   Rhinovirus / Enterovirus NOT DETECTED NOT DETECTED   Influenza A NOT DETECTED NOT DETECTED   Influenza B NOT DETECTED NOT DETECTED   Parainfluenza Virus 1 NOT DETECTED NOT DETECTED   Parainfluenza Virus 2 NOT DETECTED NOT DETECTED   Parainfluenza Virus 3 NOT DETECTED NOT DETECTED   Parainfluenza Virus 4 NOT DETECTED NOT DETECTED   Respiratory Syncytial Virus NOT DETECTED NOT DETECTED   Bordetella pertussis NOT DETECTED NOT DETECTED   Bordetella Parapertussis NOT DETECTED NOT DETECTED   Chlamydophila pneumoniae NOT DETECTED NOT DETECTED   Mycoplasma pneumoniae NOT DETECTED NOT DETECTED    Comment: Performed at [facility] [address], [place], [place] [address]  Strep pneumoniae urinary antigen     Status: None   Collection Time: 02/20/22  4:29 PM  Result Value Ref Range   Strep Pneumo Urinary Antigen NEGATIVE NEGATIVE    Comment:        Infection due to S. pneumoniae cannot be absolutely ruled out since the antigen present may be below the detection limit of the test. Performed at [facility], [address] [place], [place] [address]   Resp Panel by RT-PCR (Flu A&B, Covid) Anterior Nasal Swab     Status: None   Collection Time: 02/20/22  4:29 PM   Specimen: Anterior Nasal Swab  Result Value Ref Range   SARS Coronavirus 2 by RT PCR NEGATIVE NEGATIVE    Comment: (NOTE) SARS-CoV-2 target  nucleic acids are NOT DETECTED.  The SARS-CoV-2 RNA is generally detectable in upper respiratory specimens during the acute phase of infection. The lowest concentration of SARS-CoV-2 viral copies this assay can detect is 138 copies/mL. A negative result does not preclude  SARS-Cov-2 infection and should not be used as the sole basis for treatment or other patient management decisions. A negative result may occur with  improper specimen collection/handling, submission of specimen other than nasopharyngeal swab, presence of viral mutation(s) within the areas targeted by this assay, and inadequate number of viral copies(<138 copies/mL). A negative result must be combined with clinical observations, patient history, and epidemiological information. The expected result is Negative.  Fact Sheet for Patients:  EntrepreneurPulse.com.au  Fact Sheet for Healthcare Providers:  IncredibleEmployment.be  This test is no t yet approved or cleared by the [place] FDA and  has been authorized for detection and/or diagnosis of SARS-CoV-2 by FDA under an Emergency Use Authorization (EUA). This EUA will remain  in effect (meaning this test can be used) for the duration of the COVID-19 declaration under Section 564(b)(1) of the Act, 21 U.S.C.section 360bbb-3(b)(1), unless the authorization is terminated  or revoked sooner.       Influenza A by PCR NEGATIVE NEGATIVE   Influenza B by PCR NEGATIVE NEGATIVE    Comment: (NOTE) The Xpert Xpress SARS-CoV-2/FLU/RSV plus assay is intended as an aid in the diagnosis of influenza from Nasopharyngeal swab specimens and should not be used as a sole basis for treatment. Nasal washings and aspirates are unacceptable for Xpert Xpress SARS-CoV-2/FLU/RSV testing.  Fact Sheet for Patients: EntrepreneurPulse.com.au  Fact Sheet for Healthcare Providers: IncredibleEmployment.be  This test is not yet approved or cleared by the [place] FDA and has been authorized for detection and/or diagnosis of SARS-CoV-2 by FDA under an Emergency Use Authorization (EUA). This EUA will remain in effect (meaning this test can be used) for the duration of the COVID-19  declaration under Section 564(b)(1) of the Act, 21 U.S.C. section 360bbb-3(b)(1), unless the authorization is terminated or revoked.  Performed at [facility] [place], [address] [place], [place] [address]   CBG monitoring, ED     Status: Abnormal   Collection Time: 02/20/22  4:46 PM  Result Value Ref Range   Glucose-Capillary 131 (H) 70 - 99 mg/dL    Comment: Glucose reference range applies only to samples taken after fasting for at least 8 hours.  HIV Antibody (routine testing w rflx)     Status: None   Collection Time: 02/20/22  4:48 PM  Result Value Ref Range   HIV Screen 4th Generation wRfx Non Reactive Non Reactive    Comment: Performed at [facility], [place] [address] [place], [place] [address]  Glucose, capillary     Status: Abnormal   Collection Time: 02/20/22 10:24 PM  Result Value Ref Range   Glucose-Capillary 129 (H) 70 - 99 mg/dL    Comment: Glucose reference range applies only to samples taken after fasting for at least 8 hours.  Basic metabolic panel     Status: Abnormal   Collection Time: 02/21/22  6:33 AM  Result Value Ref Range   Sodium 134 (L) 135 - 145 mmol/L   Potassium 3.8 3.5 - 5.1 mmol/L   Chloride 105 98 - 111 mmol/L   CO2 22 22 - 32 mmol/L   Glucose, Bld 125 (H) 70 - 99 mg/dL    Comment: Glucose reference range applies only to samples taken after fasting for  at least 8 hours.   BUN 19 6 - 20 mg/dL   Creatinine, Ser 1.33 (H) 0.61 - 1.24 mg/dL   Calcium 8.5 (L) 8.9 - 10.3 mg/dL   GFR, Estimated >60 >60 mL/min    Comment: (NOTE) Calculated using the CKD-EPI Creatinine Equation (2021)    Anion gap 7 5 - 15    Comment: Performed at [facility], [address] [place], [place] [address]  CBC     Status: None   Collection Time: 02/21/22  6:33 AM  Result Value Ref Range   WBC 7.5 4.0 - 10.5 K/uL   RBC 4.93 4.22 - 5.81 MIL/uL   Hemoglobin 14.4 13.0 - 17.0 g/dL   HCT 45.0 39.0 - 52.0 %   MCV 91.3 80.0 - 100.0 fL   MCH 29.2 26.0  - 34.0 pg   MCHC 32.0 30.0 - 36.0 g/dL   RDW 14.9 11.5 - 15.5 %   Platelets 215 150 - 400 K/uL   nRBC 0.0 0.0 - 0.2 %    Comment: Performed at [facility], [address] [place], [place] [address]  ECHOCARDIOGRAM COMPLETE     Status: None   Collection Time: 02/21/22  8:56 AM  Result Value Ref Range   Weight 5,746.07 oz   Height 67 in   BP 129/89 mmHg   Ao pk vel 0.92 m/s   AR max vel 3.51 cm2   AV Peak grad 3.4 mmHg   Single Plane A4C EF 61.1 %   S' Lateral 3.22 cm   Area-P 1/2 3.85 cm2  Glucose, capillary     Status: Abnormal   Collection Time: 02/21/22  9:29 AM  Result Value Ref Range   Glucose-Capillary 162 (H) 70 - 99 mg/dL    Comment: Glucose reference range applies only to samples taken after fasting for at least 8 hours.  ANA w/Reflex if Positive     Status: None   Collection Time: 02/21/22 10:53 AM  Result Value Ref Range   Anti Nuclear Antibody (ANA) Negative Negative    Comment: (NOTE) Performed At: [facility] [place], [place] [id number] Rush Farmer MD [id number]   Rheumatoid factor     Status: Abnormal   Collection Time: 02/21/22 10:53 AM  Result Value Ref Range   Rhuematoid fact SerPl-aCnc 14.9 (H) <14.0 IU/mL    Comment: (NOTE) Performed At: [facility] [place], [place] [id number] Rush Farmer MD [id number]   Sedimentation rate     Status: None   Collection Time: 02/21/22 10:53 AM  Result Value Ref Range   Sed Rate 2 0 - 20 mm/hr    Comment: Performed at [facility], [address] [place], [place] [address]  C-reactive protein     Status: Abnormal   Collection Time: 02/21/22 10:53 AM  Result Value Ref Range   CRP 14.5 (H) <1.0 mg/dL    Comment: Performed at [place] [facility] [address], [place], [place] [address]  CK     Status: None   Collection Time: 02/21/22 10:53 AM  Result Value Ref Range   Total CK 171 49 - 397 U/L    Comment: Performed at [facility], [address] [place], [place] [address]  Pathologist smear review     Status: None   Collection Time: 02/21/22 10:53 AM  Result Value Ref Range   Path Review Blood smear is reviewed.     Comment: Platelets are adequate and  RBCs are unremarkable. Neutrophils are present in normal numbers with normal morphology. No abnormal or immature cells are seen. I note previous history of mild neutropenia, absolute neutrophil count 1300 on October 29, 2021, and  1400 on February 03, 2022. Clinical correlation and follow up are suggested to further assess for intermittent neutropenia. Reviewed by Lemmie Evens. Dicie Beam, MD. Performed at [facility], [address] [place], [place] [address]   Ferritin     Status: None   Collection Time: 02/21/22 10:53 AM  Result Value Ref Range   Ferritin 116 24 - 336 ng/mL    Comment: Performed at [facility], [address] [place], [place] [address]  Thyroid Panel With TSH     Status: None   Collection Time: 02/21/22 10:53 AM  Result Value Ref Range   TSH 1.080 0.450 - 4.500 uIU/mL   T4, Total 6.9 4.5 - 12.0 ug/dL   T3 Uptake Ratio 32 24 - 39 %   Free Thyroxine Index 2.2 1.2 - 4.9    Comment: (NOTE) Performed At: [facility] [place], [place] [id number] Rush Farmer MD [id number]   Lyme Disease Serology w/Reflex     Status: None   Collection Time: 02/21/22 10:53 AM  Result Value Ref Range   Lyme Total Antibody EIA Negative Negative    Comment: (NOTE) Lyme antibodies not detected. Reflex testing is not indicated. No laboratory evidence of infection with B. burgdorferi (Lyme disease). Negative results may occur in patients recently infected (less than or equal to 14 days) with B. burgdorferi.  If recent infection is suspected, repeat testing on a new sample collected in 7 to 14 days is recommended. Performed At: [facility] [place], [place] [id number] Rush Farmer MD  [id number]   Rocky mtn spotted fvr abs pnl(IgG+IgM)     Status: None   Collection Time: 02/21/22 10:53 AM  Result Value Ref Range   RMSF IgG Negative Negative   RMSF IgM 0.34 0.00 - 0.89 index    Comment: (NOTE)                                 Negative        <0.90                                 Equivocal 0.90 - 1.10                                 Positive        >1.10 Performed At: [facility] [place], [place] [id number] Rush Farmer MD [id number]   ANCA Profile     Status: None   Collection Time: 02/21/22 10:53 AM  Result Value Ref Range   Anti-MPO Antibodies <0.2 0.0 - 0.9 units   Anti-PR3 Antibodies <0.2 0.0 - 0.9 units   C-ANCA <1:20 Neg:<1:20 titer   P-ANCA <1:20 Neg:<1:20 titer    Comment: (NOTE) The presence of positive fluorescence exhibiting P-ANCA or C-ANCA patterns alone is not specific for the diagnosis of Wegener's Granulomatosis (WG) or microscopic polyangiitis. Decisions about treatment should not be based solely on ANCA IFA results.  The International ANCA Group Consensus recommends follow up testing of positive sera with both PR-3 and MPO-ANCA enzyme immunoassays. As many  as 5% serum samples are positive only by EIA. Ref. AM J Clin Pathol 1999;111:507-513.    Atypical P-ANCA titer <1:20 Neg:<1:20 titer    Comment: (NOTE) The atypical pANCA pattern has been observed in a significant percentage of patients with ulcerative colitis, primary sclerosing cholangitis and autoimmune hepatitis. Performed At: [facility] [place], [place] [id number] Rush Farmer MD [id number]   C4 complement     Status: None   Collection Time: 02/21/22 10:53 AM  Result Value Ref Range   Complement C4, Body Fluid 27 12 - 38 mg/dL    Comment: (NOTE) Performed At: [facility] [place], [place] [id number] Rush Farmer MD [id number]   C3 complement     Status: None   Collection Time: 02/21/22  10:53 AM  Result Value Ref Range   C3 Complement 153 82 - 167 mg/dL    Comment: (NOTE) Performed At: [facility] [place], [place] [id number] Rush Farmer MD [id number]   Glucose, capillary     Status: Abnormal   Collection Time: 02/21/22 12:20 PM  Result Value Ref Range   Glucose-Capillary 157 (H) 70 - 99 mg/dL    Comment: Glucose reference range applies only to samples taken after fasting for at least 8 hours.  Glucose, capillary     Status: Abnormal   Collection Time: 02/21/22  4:10 PM  Result Value Ref Range   Glucose-Capillary 112 (H) 70 - 99 mg/dL    Comment: Glucose reference range applies only to samples taken after fasting for at least 8 hours.  Glucose, capillary     Status: Abnormal   Collection Time: 02/21/22  7:57 PM  Result Value Ref Range   Glucose-Capillary 134 (H) 70 - 99 mg/dL    Comment: Glucose reference range applies only to samples taken after fasting for at least 8 hours.  QuantiFERON-TB Gold Plus     Status: None   Collection Time: 02/22/22  6:23 AM  Result Value Ref Range   QuantiFERON Incubation Incubation performed.    QuantiFERON-TB Gold Plus Negative Negative    Comment: (NOTE) No response to M tuberculosis antigens detected. Infection with M tuberculosis is unlikely, but high risk individuals should be considered for additional testing (ATS/IDSA/CDC Clinical Practice Guidelines, 2017). The reference range is an Antigen minus Nil result of <0.35 IU/mL. Chemiluminescence immunoassay methodology Performed At: [facility] [address] [place], [place] [id number] Rush Farmer MD [id number]   CBC     Status: None   Collection Time: 02/22/22  6:23 AM  Result Value Ref Range   WBC 4.5 4.0 - 10.5 K/uL   RBC 4.90 4.22 - 5.81 MIL/uL   Hemoglobin 14.4 13.0 - 17.0 g/dL   HCT 45.6 39.0 - 52.0 %   MCV 93.1 80.0 - 100.0 fL   MCH 29.4 26.0 - 34.0 pg   MCHC 31.6 30.0 - 36.0 g/dL   RDW 14.7 11.5 - 15.5 %    Platelets 214 150 - 400 K/uL   nRBC 0.0 0.0 - 0.2 %    Comment: Performed at [facility], [address] [place], [place] [address]  Basic metabolic panel     Status: Abnormal   Collection Time: 02/22/22  6:23 AM  Result Value Ref Range   Sodium 136 135 - 145 mmol/L   Potassium 3.6 3.5 - 5.1 mmol/L   Chloride 103 98 - 111 mmol/L   CO2 22 22 - 32 mmol/L   Glucose, Bld 101 (  H) 70 - 99 mg/dL    Comment: Glucose reference range applies only to samples taken after fasting for at least 8 hours.   BUN 18 6 - 20 mg/dL   Creatinine, Ser 1.41 (H) 0.61 - 1.24 mg/dL   Calcium 8.7 (L) 8.9 - 10.3 mg/dL   GFR, Estimated 60 (L) >60 mL/min    Comment: (NOTE) Calculated using the CKD-EPI Creatinine Equation (2021)    Anion gap 11 5 - 15    Comment: Performed at [facility], [place], [place] [address]  QuantiFERON-TB [place] Rm Plus     Status: None   Collection Time: 02/22/22  6:23 AM  Result Value Ref Range   QuantiFERON Criteria Comment     Comment: (NOTE) QuantiFERON-TB Gold Plus is a qualitative indirect test for M tuberculosis infection (including disease) and is intended for use in conjunction with risk assessment, radiography, and other medical and diagnostic evaluations. The QuantiFERON-TB Gold Plus result is determined by subtracting the Nil value from either TB antigen (Ag) value. The Mitogen tube serves as a control for the test.    QuantiFERON TB1 Ag Value 0.02 IU/mL   QuantiFERON TB2 Ag Value 0.03 IU/mL   QuantiFERON Nil Value 0.08 IU/mL   QuantiFERON Mitogen Value >10.00 IU/mL    Comment: (NOTE) Performed At: [facility] [place], [place] [id number] Rush Farmer MD [id number]   Glucose, capillary     Status: Abnormal   Collection Time: 02/22/22  8:27 AM  Result Value Ref Range   Glucose-Capillary 120 (H) 70 - 99 mg/dL    Comment: Glucose reference range applies only to samples taken after fasting for at least 8  hours.  Glucose, capillary     Status: Abnormal   Collection Time: 02/22/22 11:56 AM  Result Value Ref Range   Glucose-Capillary 129 (H) 70 - 99 mg/dL    Comment: Glucose reference range applies only to samples taken after fasting for at least 8 hours.  Glucose, capillary     Status: Abnormal   Collection Time: 02/22/22  5:08 PM  Result Value Ref Range   Glucose-Capillary 127 (H) 70 - 99 mg/dL    Comment: Glucose reference range applies only to samples taken after fasting for at least 8 hours.  Glucose, capillary     Status: Abnormal   Collection Time: 02/22/22  8:09 PM  Result Value Ref Range   Glucose-Capillary 124 (H) 70 - 99 mg/dL    Comment: Glucose reference range applies only to samples taken after fasting for at least 8 hours.   Comment 1 Notify RN   CBC     Status: None   Collection Time: 02/23/22  5:54 AM  Result Value Ref Range   WBC 4.1 4.0 - 10.5 K/uL   RBC 4.72 4.22 - 5.81 MIL/uL   Hemoglobin 14.1 13.0 - 17.0 g/dL   HCT 44.4 39.0 - 52.0 %   MCV 94.1 80.0 - 100.0 fL   MCH 29.9 26.0 - 34.0 pg   MCHC 31.8 30.0 - 36.0 g/dL   RDW 14.5 11.5 - 15.5 %   Platelets 237 150 - 400 K/uL   nRBC 0.0 0.0 - 0.2 %    Comment: Performed at [facility], [address] [place], [place] [address]  Comprehensive metabolic panel     Status: Abnormal   Collection Time: 02/23/22  5:54 AM  Result Value Ref Range   Sodium 136 135 - 145 mmol/L   Potassium 3.6  3.5 - 5.1 mmol/L   Chloride 104 98 - 111 mmol/L   CO2 21 (L) 22 - 32 mmol/L   Glucose, Bld 98 70 - 99 mg/dL    Comment: Glucose reference range applies only to samples taken after fasting for at least 8 hours.   BUN 24 (H) 6 - 20 mg/dL   Creatinine, Ser 1.42 (H) 0.61 - 1.24 mg/dL   Calcium 8.9 8.9 - 10.3 mg/dL   Total Protein 7.8 6.5 - 8.1 g/dL   Albumin 3.5 3.5 - 5.0 g/dL   AST 22 15 - 41 U/L   ALT 20 0 - 44 U/L   Alkaline Phosphatase 45 38 - 126 U/L   Total Bilirubin 1.0 0.3 - 1.2 mg/dL   GFR, Estimated 59 (L) >60  mL/min    Comment: (NOTE) Calculated using the CKD-EPI Creatinine Equation (2021)    Anion gap 11 5 - 15    Comment: Performed at [facility], [address] [place], [place] [address]  Glucose, capillary     Status: Abnormal   Collection Time: 02/23/22  7:33 AM  Result Value Ref Range   Glucose-Capillary 108 (H) 70 - 99 mg/dL    Comment: Glucose reference range applies only to samples taken after fasting for at least 8 hours.  Glucose, capillary     Status: Abnormal   Collection Time: 02/23/22 11:33 AM  Result Value Ref Range   Glucose-Capillary 125 (H) 70 - 99 mg/dL    Comment: Glucose reference range applies only to samples taken after fasting for at least 8 hours.     PHQ2/9:    02/03/2022    8:38 AM 10/29/2021    8:59 AM 07/29/2021    1:37 PM 01/18/2017   12:16 PM 12/15/2016   12:19 PM  Depression screen PHQ 2/9  Decreased Interest 0 0 0 0 0  Down, Depressed, Hopeless 0 0 0 0 0  PHQ - 2 Score 0 0 0 0 0  Altered sleeping 0 0 0    Tired, decreased energy 0 0 0    Change in appetite 0 0 0    Feeling bad or failure about yourself  0 0 0    Trouble concentrating 0 0 0    Moving slowly or fidgety/restless 0 0 0    Suicidal thoughts 0 0 0    PHQ-9 Score 0 0 0    Difficult doing work/chores Not difficult at all Not difficult at all Not difficult at all        Fall Risk:    02/03/2022    8:38 AM 10/29/2021    8:59 AM 07/29/2021    1:37 PM 01/18/2017   12:16 PM 12/15/2016   12:19 PM  Fall Risk   Falls in the past year? 0 0 0 No No  Number falls in past yr: 0 0 0    Injury with Fall? 0 0 0    Risk for fall due to : No Fall Risks No Fall Risks No Fall Risks    Follow up Falls evaluation completed Falls evaluation completed Falls evaluation completed        Functional Status Survey:      Assessment & Plan  Problem List Items Addressed This Visit       Respiratory   Acute respiratory failure with hypoxia, recurrent, in setting of previous treatment for  pneumonia, relapsing/remitting fever, no previous work-up for malignant or autoimmune cause or unusual infection    Appears resolved  after discharge from hospitalization  Reports his breathing has significantly improved since admission but he is concerned by repeat hospitalizations and sepsis dx Discussed rescheduling his Pulmonology apt for eval Recommend he keep apts with hematology for monitoring of clotting disorder and DVT Follow up as needed for return of symptoms.        Relevant Orders   Ambulatory referral to Immunology   Ambulatory referral to Rheumatology   Other Visit Diagnoses     Hospital discharge follow-up    -  Primary Reviewed hospital labs- elevated RF results suggest potential rheumatological involvement Will make referral to Rheumatology and Immunology services for assistance with eval Patient reports overall resolution of symptoms that had prompted admission Will check CMP and CBC to ensure he is on the mend- results to dictate further management  CMP revealed reduced GFR  Reviewed Lyme, RMSF, TB results- all negative  Will continue to follow with specialty services and coordinate care as needed.  Follow up as needed.     Relevant Orders   Comp Met (CMET)   CBC w/Diff        No follow-ups on file.   I, Alieyah Spader E Abagayle Klutts, PA-C, have reviewed all documentation for this visit. The documentation on 03/01/22 for the exam, diagnosis, procedures, and orders are all accurate and complete.   Talitha Givens, MHS, PA-C [facility]

## 2022-03-01 NOTE — Assessment & Plan Note (Signed)
Appears resolved after discharge from hospitalization  Reports his breathing has significantly improved since admission but he is concerned by repeat hospitalizations and sepsis dx Discussed rescheduling his Pulmonology apt for eval Recommend he keep apts with hematology for monitoring of clotting disorder and DVT Follow up as needed for return of symptoms.

## 2022-03-01 NOTE — Patient Instructions (Signed)
We will keep you updated with the results of your labs once they are available Please reschedule your Pulmonology apt to make sure your lungs are stable   I have sent in referrals for you to be evaluated wit Rheumatology and Immunology- the referral team should call you soon to set these apts up  Let us know if you have further questions or concerns

## 2022-03-02 LAB — COMPREHENSIVE METABOLIC PANEL
ALT: 37 IU/L (ref 0–44)
AST: 28 IU/L (ref 0–40)
Albumin/Globulin Ratio: 1.1 — ABNORMAL LOW (ref 1.2–2.2)
Albumin: 4.2 g/dL (ref 3.8–4.9)
Alkaline Phosphatase: 71 IU/L (ref 44–121)
BUN/Creatinine Ratio: 12 (ref 9–20)
BUN: 19 mg/dL (ref 6–24)
Bilirubin Total: 0.5 mg/dL (ref 0.0–1.2)
CO2: 19 mmol/L — ABNORMAL LOW (ref 20–29)
Calcium: 9.3 mg/dL (ref 8.7–10.2)
Chloride: 102 mmol/L (ref 96–106)
Creatinine, Ser: 1.54 mg/dL — ABNORMAL HIGH (ref 0.76–1.27)
Globulin, Total: 3.7 g/dL (ref 1.5–4.5)
Glucose: 116 mg/dL — ABNORMAL HIGH (ref 70–99)
Potassium: 4.5 mmol/L (ref 3.5–5.2)
Sodium: 138 mmol/L (ref 134–144)
Total Protein: 7.9 g/dL (ref 6.0–8.5)
eGFR: 54 mL/min/{1.73_m2} — ABNORMAL LOW (ref >59)

## 2022-03-02 LAB — CBC WITH DIFFERENTIAL/PLATELET
Basophils Absolute: 0 10*3/uL (ref 0.0–0.2)
Basos: 1 %
EOS (ABSOLUTE): 0.1 10*3/uL (ref 0.0–0.4)
Eos: 3 %
Hematocrit: 45.1 % (ref 37.5–51.0)
Hemoglobin: 14.9 g/dL (ref 13.0–17.7)
Immature Grans (Abs): 0 10*3/uL (ref 0.0–0.1)
Immature Granulocytes: 0 %
Lymphocytes Absolute: 1.9 10*3/uL (ref 0.7–3.1)
Lymphs: 49 %
MCH: 29.6 pg (ref 26.6–33.0)
MCHC: 33 g/dL (ref 31.5–35.7)
MCV: 90 fL (ref 79–97)
Monocytes Absolute: 0.5 10*3/uL (ref 0.1–0.9)
Monocytes: 13 %
Neutrophils Absolute: 1.3 10*3/uL — ABNORMAL LOW (ref 1.4–7.0)
Neutrophils: 34 %
Platelets: 318 10*3/uL (ref 150–450)
RBC: 5.03 x10E6/uL (ref 4.14–5.80)
RDW: 13.8 % (ref 11.6–15.4)
WBC: 3.8 10*3/uL (ref 3.4–10.8)

## 2022-03-04 NOTE — Progress Notes (Deleted)
There were no vitals taken for this visit.   Subjective:    Patient ID: Ryan [name], male    DOB: 21-May-1971, 51 y.o.   MRN: [id number]  HPI: Ryan [name] is a 51 y.o. male  No chief complaint on file.  HYPERTENSION / HYPERLIPIDEMIA Satisfied with current treatment? {Blank single:19197::"yes","no"} Duration of hypertension: {Blank single:19197::"chronic","months","years"} BP monitoring frequency: {Blank single:19197::"not checking","rarely","daily","weekly","monthly","a few times a day","a few times a week","a few times a month"} BP range:  BP medication side effects: {Blank single:19197::"yes","no"} Past BP meds: {Blank EFEOFHQR:97588::"TGPQ","DIYMEBRAXE","NMMHWKGSUP/JSRPRXYVOP","FYTWKMQK","MMNOTRRNHA","FBXUXYBFXO/VANV","BTYOMAYOKH (bystolic)","carvedilol","chlorthalidone","clonidine","diltiazem","exforge HCT","HCTZ","irbesartan (avapro)","labetalol","lisinopril","lisinopril-HCTZ","losartan (cozaar)","methyldopa","nifedipine","olmesartan (benicar)","olmesartan-HCTZ","quinapril","ramipril","spironalactone","tekturna","valsartan","valsartan-HCTZ","verapamil"} Duration of hyperlipidemia: {Blank single:19197::"chronic","months","years"} Cholesterol medication side effects: {Blank single:19197::"yes","no"} Cholesterol supplements: {Blank multiple:19196::"none","fish oil","niacin","red yeast rice"} Past cholesterol medications: {Blank multiple:19196::"none","atorvastain (lipitor)","lovastatin (mevacor)","pravastatin (pravachol)","rosuvastatin (crestor)","simvastatin (zocor)","vytorin","fenofibrate (tricor)","gemfibrozil","ezetimide (zetia)","niaspan","lovaza"} Medication compliance: {Blank single:19197::"excellent compliance","good compliance","fair compliance","poor compliance"} Aspirin: {Blank single:19197::"yes","no"} Recent stressors: {Blank single:19197::"yes","no"} Recurrent headaches: {Blank single:19197::"yes","no"} Visual changes: {Blank single:19197::"yes","no"} Palpitations:  {Blank single:19197::"yes","no"} Dyspnea: {Blank single:19197::"yes","no"} Chest pain: {Blank single:19197::"yes","no"} Lower extremity edema: {Blank single:19197::"yes","no"} Dizzy/lightheaded: {Blank single:19197::"yes","no"}  DIABETES Hypoglycemic episodes:{Blank single:19197::"yes","no"} Polydipsia/polyuria: {Blank single:19197::"yes","no"} Visual disturbance: {Blank single:19197::"yes","no"} Chest pain: {Blank single:19197::"yes","no"} Paresthesias: {Blank single:19197::"yes","no"} Glucose Monitoring: {Blank single:19197::"yes","no"}  Accucheck frequency: {Blank single:19197::"Not Checking","Daily","BID","TID"}  Fasting glucose:  Post prandial:  Evening:  Before meals: Taking Insulin?: {Blank single:19197::"yes","no"}  Long acting insulin:  Short acting insulin: Blood Pressure Monitoring: {Blank single:19197::"not checking","rarely","daily","weekly","monthly","a few times a day","a few times a week","a few times a month"} Retinal Examination: {Blank single:19197::"Up to Date","Not up to Date"} Foot Exam: {Blank single:19197::"Up to Date","Not up to Date"} Diabetic Education: {Blank single:19197::"Completed","Not Completed"} Pneumovax: {Blank single:19197::"Up to Date","Not up to Date","unknown"} Influenza: {Blank single:19197::"Up to Date","Not up to Date","unknown"} Aspirin: {Blank single:19197::"yes","no"}  CHRONIC KIDNEY DISEASE CKD status: {Blank single:19197::"controlled","uncontrolled","better","worse","exacerbated","stable"} Medications renally dose: {Blank single:19197::"yes","no"} Previous renal evaluation: {Blank single:19197::"yes","no"} Pneumovax:  {Blank single:19197::"Up to Date","Not up to Date","unknown"} Influenza Vaccine:  {Blank single:19197::"Up to Date","Not up to Date","unknown"}  Relevant past medical, surgical, family and social history reviewed and updated as indicated. Interim medical history since our last visit reviewed. Allergies and medications  reviewed and updated.  Review of Systems  Per HPI unless specifically indicated above     Objective:    There were no vitals taken for this visit.  Wt Readings from Last 3 Encounters:  03/01/22 (!) 355 lb 12.8 oz (161.4 kg)  02/20/22 (!) 359 lb 2.1 oz (162.9 kg)  02/09/22 (!) 357 lb 9.6 oz (162.2 kg)    Physical Exam  Results for orders placed or performed in visit on 03/01/22  Comp Met (CMET)  Result Value Ref Range   Glucose 116 (H) 70 - 99 mg/dL   BUN 19 6 - 24 mg/dL   Creatinine, Ser 1.54 (H) 0.76 - 1.27 mg/dL   eGFR 54 (L) >59 mL/min/1.73   BUN/Creatinine Ratio 12 9 - 20   Sodium 138 134 - 144 mmol/L   Potassium 4.5 3.5 - 5.2 mmol/L   Chloride 102 96 - 106 mmol/L   CO2 19 (L) 20 - 29 mmol/L   Calcium 9.3 8.7 - 10.2 mg/dL   Total Protein 7.9 6.0 - 8.5 g/dL   Albumin 4.2 3.8 - 4.9 g/dL   Globulin, Total 3.7 1.5 - 4.5 g/dL   Albumin/Globulin Ratio 1.1 (L) 1.2 - 2.2   Bilirubin Total 0.5 0.0 - 1.2 mg/dL   Alkaline Phosphatase 71 44 - 121 IU/L   AST 28 0 - 40 IU/L   ALT 37 0 - 44 IU/L  CBC w/Diff  Result Value Ref Range   WBC 3.8  3.4 - 10.8 x10E3/uL   RBC 5.03 4.14 - 5.80 x10E6/uL   Hemoglobin 14.9 13.0 - 17.7 g/dL   Hematocrit 45.1 37.5 - 51.0 %   MCV 90 79 - 97 fL   MCH 29.6 26.6 - 33.0 pg   MCHC 33.0 31.5 - 35.7 g/dL   RDW 13.8 11.6 - 15.4 %   Platelets 318 150 - 450 x10E3/uL   Neutrophils 34 Not Estab. %   Lymphs 49 Not Estab. %   Monocytes 13 Not Estab. %   Eos 3 Not Estab. %   Basos 1 Not Estab. %   Neutrophils Absolute 1.3 (L) 1.4 - 7.0 x10E3/uL   Lymphocytes Absolute 1.9 0.7 - 3.1 x10E3/uL   Monocytes Absolute 0.5 0.1 - 0.9 x10E3/uL   EOS (ABSOLUTE) 0.1 0.0 - 0.4 x10E3/uL   Basophils Absolute 0.0 0.0 - 0.2 x10E3/uL   Immature Granulocytes 0 Not Estab. %   Immature Grans (Abs) 0.0 0.0 - 0.1 x10E3/uL      Assessment & Plan:   Problem List Items Addressed This Visit   None    Follow up plan: No follow-ups on file.

## 2022-03-05 ENCOUNTER — Encounter: Payer: No Typology Code available for payment source | Admitting: Nurse Practitioner

## 2022-03-11 ENCOUNTER — Ambulatory Visit: Payer: No Typology Code available for payment source

## 2022-03-18 ENCOUNTER — Ambulatory Visit (INDEPENDENT_AMBULATORY_CARE_PROVIDER_SITE_OTHER): Payer: No Typology Code available for payment source | Admitting: Nurse Practitioner

## 2022-03-18 ENCOUNTER — Encounter: Payer: Self-pay | Admitting: Nurse Practitioner

## 2022-03-18 VITALS — BP 132/89 | HR 88 | Temp 98.6°F | Ht 67.32 in | Wt 355.6 lb

## 2022-03-18 DIAGNOSIS — I825Z3 Chronic embolism and thrombosis of unspecified deep veins of distal lower extremity, bilateral: Secondary | ICD-10-CM | POA: Diagnosis not present

## 2022-03-18 DIAGNOSIS — E119 Type 2 diabetes mellitus without complications: Secondary | ICD-10-CM | POA: Diagnosis not present

## 2022-03-18 DIAGNOSIS — Z23 Encounter for immunization: Secondary | ICD-10-CM

## 2022-03-18 DIAGNOSIS — N1831 Chronic kidney disease, stage 3a: Secondary | ICD-10-CM | POA: Diagnosis not present

## 2022-03-18 DIAGNOSIS — I5032 Chronic diastolic (congestive) heart failure: Secondary | ICD-10-CM

## 2022-03-18 DIAGNOSIS — Z1159 Encounter for screening for other viral diseases: Secondary | ICD-10-CM

## 2022-03-18 DIAGNOSIS — Z0000 Encounter for general adult medical examination without abnormal findings: Secondary | ICD-10-CM | POA: Diagnosis not present

## 2022-03-18 DIAGNOSIS — I10 Essential (primary) hypertension: Secondary | ICD-10-CM

## 2022-03-18 DIAGNOSIS — Z6841 Body Mass Index (BMI) 40.0 and over, adult: Secondary | ICD-10-CM

## 2022-03-18 DIAGNOSIS — E7800 Pure hypercholesterolemia, unspecified: Secondary | ICD-10-CM

## 2022-03-18 LAB — URINALYSIS, ROUTINE W REFLEX MICROSCOPIC
Bilirubin, UA: NEGATIVE
Ketones, UA: NEGATIVE
Leukocytes,UA: NEGATIVE
Nitrite, UA: NEGATIVE
Specific Gravity, UA: 1.025 (ref 1.005–1.030)
Urobilinogen, Ur: 0.2 mg/dL (ref 0.2–1.0)
pH, UA: 5.5 (ref 5.0–7.5)

## 2022-03-18 LAB — MICROSCOPIC EXAMINATION
Bacteria, UA: NONE SEEN
WBC, UA: NONE SEEN /hpf (ref 0–5)

## 2022-03-18 NOTE — Assessment & Plan Note (Signed)
Chronic. EF 60-65%. Continue Losartan and Jardiance.  Follow up in 3 months. Keep follow up with Cardiology.  Call sooner if concerns arise.

## 2022-03-18 NOTE — Assessment & Plan Note (Signed)
Chronic.  Controlled.  Continue with current medication regimen of Losartan 50mg  daily.  Labs ordered today.  Return to clinic in 6 months for reevaluation.  Call sooner if concerns arise.

## 2022-03-18 NOTE — Assessment & Plan Note (Signed)
Chronic.  Controlled.  Continue with current medication regimen of Mounjaro weekly and Jardiance daily.  Labs ordered today.  Return to clinic in 6 months for reevaluation.  Call sooner if concerns arise.

## 2022-03-18 NOTE — Progress Notes (Signed)
BP 132/89   Pulse 88   Temp 98.6 F (37 C) (Oral)   Ht 5' 7.32" (1.71 m)   Wt (!) 355 lb 9.6 oz (161.3 kg)   SpO2 92%   BMI 55.16 kg/m    Subjective:    Patient ID: Ryan [name], male    DOB: [birth year], 51 y.o.   MRN: [id number]  HPI: Ryan [name] is a 51 y.o. male presenting on 03/18/2022 for comprehensive medical examination. Current medical complaints include:none  He currently lives with: Interim Problems from his last visit: no  HYPERTENSION / HYPERLIPIDEMIA Satisfied with current treatment? yes Duration of hypertension: years BP monitoring frequency: not checking BP range:  BP medication side effects: no Past BP meds: losartan (cozaar) and metoprolol Duration of hyperlipidemia: years Cholesterol medication side effects: no Cholesterol supplements: none Past cholesterol medications: none Medication compliance: excellent compliance Aspirin: no Recent stressors: no Recurrent headaches: no Visual changes: no Palpitations: no Dyspnea: no Chest pain: no Lower extremity edema: no Dizzy/lightheaded: no  DIABETES Hypoglycemic episodes:no Polydipsia/polyuria: no Visual disturbance: no Chest pain: no Paresthesias: no Glucose Monitoring: no  Accucheck frequency: Not Checking  Fasting glucose:  Post prandial:  Evening:  Before meals: Taking Insulin?: no  Long acting insulin:  Short acting insulin: Blood Pressure Monitoring: not checking Retinal Examination:  [facility]  Foot Exam: Up to Date Diabetic Education: Not Completed Pneumovax: Up to Date Influenza: Not up to Date Aspirin: no  CHRONIC KIDNEY DISEASE CKD status: controlled Medications renally dose: yes Previous renal evaluation: no Pneumovax:  Up to Date Influenza Vaccine:  Not up to Date  Depression Screen done today and results listed below:     03/18/2022    2:12 PM 02/03/2022    8:38 AM 10/29/2021    8:59 AM 07/29/2021    1:37 PM 01/18/2017   12:16 PM  Depression screen PHQ 2/9   Decreased Interest 0 0 0 0 0  Down, Depressed, Hopeless 0 0 0 0 0  PHQ - 2 Score 0 0 0 0 0  Altered sleeping 0 0 0 0   Tired, decreased energy 0 0 0 0   Change in appetite 0 0 0 0   Feeling bad or failure about yourself  0 0 0 0   Trouble concentrating 0 0 0 0   Moving slowly or fidgety/restless 0 0 0 0   Suicidal thoughts 0 0 0 0   PHQ-9 Score 0 0 0 0   Difficult doing work/chores Not difficult at all Not difficult at all Not difficult at all Not difficult at all     The patient does not have a history of falls. I did complete a risk assessment for falls. A plan of care for falls was documented.   Past Medical History:  Past Medical History:  Diagnosis Date   CHF (congestive heart failure) ([place])    Diabetes mellitus without complication ([place])    per pt-pre diabetic-Dr Rosario Jacks stated said pt   DVT (deep venous thrombosis) ([place])    Hepatic steatosis    Hypertension    Morbid obesity with BMI of 50.0-59.9, adult ([place])    Peripheral vascular disease ([place])    Presence of IVC filter    Ventricular trigeminy    Weakness of both lower limbs     Surgical History:  Past Surgical History:  Procedure Laterality Date   CARDIAC ELECTROPHYSIOLOGY STUDY AND ABLATION  10/2020   PERIPHERAL VASCULAR CATHETERIZATION N/A 01/10/2015   Procedure: Dialysis/Perma Catheter  Insertion;  Surgeon: Katha Cabal, MD;  Location: [place] [facility];  Service: Cardiovascular;  Laterality: N/A;   PERIPHERAL VASCULAR CATHETERIZATION N/A 02/24/2015   Procedure: Dialysis/Perma Catheter Removal;  Surgeon: Algernon Huxley, MD;  Location: [place] [facility];  Service: Cardiovascular;  Laterality: N/A;    Medications:  Current Outpatient Medications on File Prior to Visit  Medication Sig   albuterol (VENTOLIN HFA) 108 (90 Base) MCG/ACT inhaler Inhale 2 puffs into the lungs every 6 (six) hours as needed for wheezing or shortness of breath.   empagliflozin (JARDIANCE) 25 MG TABS tablet Take 1 tablet (25 mg  total) by mouth daily before breakfast.   furosemide (LASIX) 40 MG tablet Take 1 tablet (40 mg total) by mouth daily.   losartan (COZAAR) 50 MG tablet Take 1 tablet (50 mg total) by mouth daily.   magnesium oxide (MAG-OX) 400 (240 Mg) MG tablet Take 400 mg by mouth daily.   metoprolol succinate (TOPROL-XL) 25 MG 24 hr tablet Take 1 tablet (25 mg total) by mouth daily.   rivaroxaban (XARELTO) 20 MG TABS tablet Take 20 mg by mouth daily.   tirzepatide [facility]) 2.5 MG/0.5ML Pen Inject 2.5 mg into the skin once a week. (Patient not taking: Reported on 03/18/2022)   No current facility-administered medications on file prior to visit.    Allergies:  Allergies  Allergen Reactions   Iodinated Contrast Media Other (See Comments)    Kidney failure requiring dialysis in 2013. He has had cta for PE since 2013.     Metrizamide Other (See Comments)    Kidney failure requiring dialysis Kidney failure requiring dialysis    Clindamycin/Lincomycin Rash   Lincomycin Rash   Sulfa Antibiotics Rash    Social History:  Social History   Socioeconomic History   Marital status: Married    Spouse name: Not on file   Number of children: Not on file   Years of education: Not on file   Highest education level: Not on file  Occupational History   Not on file  Tobacco Use   Smoking status: Never   Smokeless tobacco: Never  Vaping Use   Vaping Use: Never used  Substance and Sexual Activity   Alcohol use: No   Drug use: No   Sexual activity: Yes  Other Topics Concern   Not on file  Social History Narrative   Not on file   Social Determinants of Health   Financial Resource Strain: Not on file  Food Insecurity: Not on file  Transportation Needs: Not on file  Physical Activity: Not on file  Stress: Not on file  Social Connections: Not on file  Intimate Partner Violence: Not on file   Social History   Tobacco Use  Smoking Status Never  Smokeless Tobacco Never   Social History    Substance and Sexual Activity  Alcohol Use No    Family History:  Family History  Problem Relation Age of Onset   Hypertension Mother    Cancer Mother    Breast cancer Mother    Prostate cancer Father    Hypertension Father    Diabetes Father    Pulmonary embolism Paternal Grandfather    Pulmonary embolism Paternal Uncle     Past medical history, surgical history, medications, allergies, family history and social history reviewed with patient today and changes made to appropriate areas of the chart.   Review of Systems  HENT:         Denies vision changes.  Eyes:  Negative for blurred vision and double vision.  Respiratory:  Negative for shortness of breath.   Cardiovascular:  Negative for chest pain, palpitations and leg swelling.  Neurological:  Negative for dizziness, tingling and headaches.  Endo/Heme/Allergies:  Negative for polydipsia.       Denies Polyuria   All other ROS negative except what is listed above and in the HPI.      Objective:    BP 132/89   Pulse 88   Temp 98.6 F (37 C) (Oral)   Ht 5' 7.32" (1.71 m)   Wt (!) 355 lb 9.6 oz (161.3 kg)   SpO2 92%   BMI 55.16 kg/m   Wt Readings from Last 3 Encounters:  03/18/22 (!) 355 lb 9.6 oz (161.3 kg)  03/01/22 (!) 355 lb 12.8 oz (161.4 kg)  02/20/22 (!) 359 lb 2.1 oz (162.9 kg)    Physical Exam Vitals and nursing note reviewed.  Constitutional:      General: He is not in acute distress.    Appearance: Normal appearance. He is obese. He is not ill-appearing, toxic-appearing or diaphoretic.  HENT:     Head: Normocephalic.     Right Ear: Tympanic membrane, ear canal and external ear normal.     Left Ear: Tympanic membrane, ear canal and external ear normal.     Nose: Nose normal. No congestion or rhinorrhea.     Mouth/Throat:     Mouth: Mucous membranes are moist.  Eyes:     General:        Right eye: No discharge.        Left eye: No discharge.     Extraocular Movements: Extraocular movements  intact.     Conjunctiva/sclera: Conjunctivae normal.     Pupils: Pupils are equal, round, and reactive to light.  Cardiovascular:     Rate and Rhythm: Normal rate and regular rhythm.     Heart sounds: No murmur heard. Pulmonary:     Effort: Pulmonary effort is normal. No respiratory distress.     Breath sounds: Normal breath sounds. No wheezing, rhonchi or rales.  Abdominal:     General: Abdomen is flat. Bowel sounds are normal. There is no distension.     Palpations: Abdomen is soft.     Tenderness: There is no abdominal tenderness. There is no guarding.  Musculoskeletal:     Cervical back: Normal range of motion and neck supple.  Skin:    General: Skin is warm and dry.     Capillary Refill: Capillary refill takes less than 2 seconds.  Neurological:     General: No focal deficit present.     Mental Status: He is alert and oriented to person, place, and time.     Cranial Nerves: No cranial nerve deficit.     Motor: No weakness.     Deep Tendon Reflexes: Reflexes normal.  Psychiatric:        Mood and Affect: Mood normal.        Behavior: Behavior normal.        Thought Content: Thought content normal.        Judgment: Judgment normal.     Results for orders placed or performed in visit on 03/01/22  Comp Met (CMET)  Result Value Ref Range   Glucose 116 (H) 70 - 99 mg/dL   BUN 19 6 - 24 mg/dL   Creatinine, Ser 1.54 (H) 0.76 - 1.27 mg/dL   eGFR 54 (L) >59 mL/min/1.73   BUN/Creatinine Ratio  12 9 - 20   Sodium 138 134 - 144 mmol/L   Potassium 4.5 3.5 - 5.2 mmol/L   Chloride 102 96 - 106 mmol/L   CO2 19 (L) 20 - 29 mmol/L   Calcium 9.3 8.7 - 10.2 mg/dL   Total Protein 7.9 6.0 - 8.5 g/dL   Albumin 4.2 3.8 - 4.9 g/dL   Globulin, Total 3.7 1.5 - 4.5 g/dL   Albumin/Globulin Ratio 1.1 (L) 1.2 - 2.2   Bilirubin Total 0.5 0.0 - 1.2 mg/dL   Alkaline Phosphatase 71 44 - 121 IU/L   AST 28 0 - 40 IU/L   ALT 37 0 - 44 IU/L  CBC w/Diff  Result Value Ref Range   WBC 3.8 3.4 - 10.8  x10E3/uL   RBC 5.03 4.14 - 5.80 x10E6/uL   Hemoglobin 14.9 13.0 - 17.7 g/dL   Hematocrit 45.1 37.5 - 51.0 %   MCV 90 79 - 97 fL   MCH 29.6 26.6 - 33.0 pg   MCHC 33.0 31.5 - 35.7 g/dL   RDW 13.8 11.6 - 15.4 %   Platelets 318 150 - 450 x10E3/uL   Neutrophils 34 Not Estab. %   Lymphs 49 Not Estab. %   Monocytes 13 Not Estab. %   Eos 3 Not Estab. %   Basos 1 Not Estab. %   Neutrophils Absolute 1.3 (L) 1.4 - 7.0 x10E3/uL   Lymphocytes Absolute 1.9 0.7 - 3.1 x10E3/uL   Monocytes Absolute 0.5 0.1 - 0.9 x10E3/uL   EOS (ABSOLUTE) 0.1 0.0 - 0.4 x10E3/uL   Basophils Absolute 0.0 0.0 - 0.2 x10E3/uL   Immature Granulocytes 0 Not Estab. %   Immature Grans (Abs) 0.0 0.0 - 0.1 x10E3/uL      Assessment & Plan:   Problem List Items Addressed This Visit       Cardiovascular and Mediastinum   Hypertension    Chronic.  Controlled.  Continue with current medication regimen of Losartan 24m daily.  Labs ordered today.  Return to clinic in 6 months for reevaluation.  Call sooner if concerns arise.        Chronic diastolic CHF (congestive heart failure) (HCC)    Chronic. EF 60-65%. Continue Losartan and Jardiance.  Follow up in 3 months. Keep follow up with Cardiology.  Call sooner if concerns arise.       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)    Continue to follow up with Hematology.  Labs ordered today.         Endocrine   Diabetes mellitus without complication (H[facility])    Chronic.  Controlled.  Continue with current medication regimen of Mounjaro weekly and Jardiance daily.  Labs ordered today.  Return to clinic in 6 months for reevaluation.  Call sooner if concerns arise.        Relevant Orders   HgB A1c     Genitourinary   CKD (chronic kidney disease), stage IIIa    Chronic.  Controlled.  Continue with current medication regimen of Jardiance 224mdaily.  Labs ordered today.  Return to clinic in 3 months for reevaluation.  Call sooner if concerns arise.         Relevant Orders   Comprehensive metabolic panel     Other   Morbid obesity with BMI of 50.0-59.9, adult ([place]   Recommend a healthy lifestyle through diet and exercise.       Hypercholesterolemia    Chronic.  Controlled.  Continue with current medication regimen.  Labs ordered today.  Return to clinic in 6 months for reevaluation.  Call sooner if concerns arise.         Relevant Orders   Lipid panel   Other Visit Diagnoses     Annual physical exam    -  Primary   Health maintenance reviewed during visit today. Labs ordered. TDAP given. Will hold off on Colonoscopy due to ongoing issues with infection and blood clots.   Relevant Orders   TSH   PSA   Lipid panel   CBC with Differential/Platelet   Comprehensive metabolic panel   Urinalysis, Routine w reflex microscopic   HgB A1c   Encounter for hepatitis C screening test for low risk patient       Relevant Orders   Hepatitis C Antibody   Need for tetanus booster       Relevant Orders   Tdap vaccine greater than or equal to 7yo IM        Discussed aspirin prophylaxis for myocardial infarction prevention and decision was it was not indicated  LABORATORY TESTING:  Health maintenance labs ordered today as discussed above.   The natural history of prostate cancer and ongoing controversy regarding screening and potential treatment outcomes of prostate cancer has been discussed with the patient. The meaning of a false positive PSA and a false negative PSA has been discussed. He indicates understanding of the limitations of this screening test and wishes to proceed with screening PSA testing.   IMMUNIZATIONS:   - Tdap: Tetanus vaccination status reviewed: last tetanus booster within 10 years. - Influenza: Postponed to flu season - Pneumovax: Up to date - Prevnar: Not applicable - COVID: Not applicable - HPV: Not applicable - Shingrix vaccine: Not applicable  SCREENING: - Colonoscopy:  Will hold off at this time due to  recent episodes of sepsis   Discussed with patient purpose of the colonoscopy is to detect colon cancer at curable precancerous or early stages   - AAA Screening: Not applicable  -Hearing Test: Not applicable  -Spirometry: Not applicable   PATIENT COUNSELING:    Sexuality: Discussed sexually transmitted diseases, partner selection, use of condoms, avoidance of unintended pregnancy  and contraceptive alternatives.   Advised to avoid cigarette smoking.  I discussed with the patient that most people either abstain from alcohol or drink within safe limits (<=14/week and <=4 drinks/occasion for males, <=7/weeks and <= 3 drinks/occasion for females) and that the risk for alcohol disorders and other health effects rises proportionally with the number of drinks per week and how often a drinker exceeds daily limits.  Discussed cessation/primary prevention of drug use and availability of treatment for abuse.   Diet: Encouraged to adjust caloric intake to maintain  or achieve ideal body weight, to reduce intake of dietary saturated fat and total fat, to limit sodium intake by avoiding high sodium foods and not adding table salt, and to maintain adequate dietary potassium and calcium preferably from fresh fruits, vegetables, and low-fat dairy products.    stressed the importance of regular exercise  Injury prevention: Discussed safety belts, safety helmets, smoke detector, smoking near bedding or upholstery.   Dental health: Discussed importance of regular tooth brushing, flossing, and dental visits.   Follow up plan: NEXT PREVENTATIVE PHYSICAL DUE IN 1 YEAR. Return in about 3 months (around 06/18/2022) for HTN, HLD, DM2 FU.

## 2022-03-18 NOTE — Assessment & Plan Note (Signed)
Continue to follow up with Hematology.  Labs ordered today.

## 2022-03-18 NOTE — Assessment & Plan Note (Signed)
Chronic.  Controlled.  Continue with current medication regimen of Jardiance 25mg  daily.  Labs ordered today.  Return to clinic in 3 months for reevaluation.  Call sooner if concerns arise.

## 2022-03-18 NOTE — Assessment & Plan Note (Signed)
Chronic.  Controlled.  Continue with current medication regimen.  Labs ordered today.  Return to clinic in 6 months for reevaluation.  Call sooner if concerns arise.  ? ?

## 2022-03-18 NOTE — Assessment & Plan Note (Signed)
Recommend a healthy lifestyle through diet and exercise.  °

## 2022-03-19 LAB — COMPREHENSIVE METABOLIC PANEL
ALT: 21 IU/L (ref 0–44)
AST: 16 IU/L (ref 0–40)
Albumin/Globulin Ratio: 1.4 (ref 1.2–2.2)
Albumin: 4.6 g/dL (ref 3.8–4.9)
Alkaline Phosphatase: 69 IU/L (ref 44–121)
BUN/Creatinine Ratio: 10 (ref 9–20)
BUN: 15 mg/dL (ref 6–24)
Bilirubin Total: 0.8 mg/dL (ref 0.0–1.2)
CO2: 19 mmol/L — ABNORMAL LOW (ref 20–29)
Calcium: 9.4 mg/dL (ref 8.7–10.2)
Chloride: 103 mmol/L (ref 96–106)
Creatinine, Ser: 1.48 mg/dL — ABNORMAL HIGH (ref 0.76–1.27)
Globulin, Total: 3.4 g/dL (ref 1.5–4.5)
Glucose: 91 mg/dL (ref 70–99)
Potassium: 4.1 mmol/L (ref 3.5–5.2)
Sodium: 138 mmol/L (ref 134–144)
Total Protein: 8 g/dL (ref 6.0–8.5)
eGFR: 57 mL/min/{1.73_m2} — ABNORMAL LOW (ref >59)

## 2022-03-19 LAB — CBC WITH DIFFERENTIAL/PLATELET
Basophils Absolute: 0 10*3/uL (ref 0.0–0.2)
Basos: 1 %
EOS (ABSOLUTE): 0.1 10*3/uL (ref 0.0–0.4)
Eos: 3 %
Hematocrit: 46.1 % (ref 37.5–51.0)
Hemoglobin: 15.3 g/dL (ref 13.0–17.7)
Immature Grans (Abs): 0 10*3/uL (ref 0.0–0.1)
Immature Granulocytes: 0 %
Lymphocytes Absolute: 1.6 10*3/uL (ref 0.7–3.1)
Lymphs: 40 %
MCH: 30.2 pg (ref 26.6–33.0)
MCHC: 33.2 g/dL (ref 31.5–35.7)
MCV: 91 fL (ref 79–97)
Monocytes Absolute: 0.6 10*3/uL (ref 0.1–0.9)
Monocytes: 14 %
Neutrophils Absolute: 1.7 10*3/uL (ref 1.4–7.0)
Neutrophils: 42 %
Platelets: 250 10*3/uL (ref 150–450)
RBC: 5.07 x10E6/uL (ref 4.14–5.80)
RDW: 13.4 % (ref 11.6–15.4)
WBC: 4.1 10*3/uL (ref 3.4–10.8)

## 2022-03-19 LAB — TSH: TSH: 1.81 u[IU]/mL (ref 0.450–4.500)

## 2022-03-19 LAB — PSA: Prostate Specific Ag, Serum: 1.9 ng/mL (ref 0.0–4.0)

## 2022-03-19 LAB — LIPID PANEL
Chol/HDL Ratio: 4.6 ratio (ref 0.0–5.0)
Cholesterol, Total: 188 mg/dL (ref 100–199)
HDL: 41 mg/dL (ref >39)
LDL Chol Calc (NIH): 130 mg/dL — ABNORMAL HIGH (ref 0–99)
Triglycerides: 91 mg/dL (ref 0–149)
VLDL Cholesterol Cal: 17 mg/dL (ref 5–40)

## 2022-03-19 LAB — HEMOGLOBIN A1C
Est. average glucose Bld gHb Est-mCnc: 140 mg/dL
Hgb A1c MFr Bld: 6.5 % — ABNORMAL HIGH (ref 4.8–5.6)

## 2022-03-19 LAB — HEPATITIS C ANTIBODY: Hep C Virus Ab: NONREACTIVE

## 2022-03-19 NOTE — Progress Notes (Signed)
Hi Ryan [name]. It was nice to see you yesterday.  Your lab work looks good.  Your A1c remains well controlled at 6.5.  Cholesterol is relatively the same.  Kidney function remains stable.  Continue with your current medication regimen.  Follow up as discussed.  Please let me know if you have any questions.

## 2022-04-06 ENCOUNTER — Emergency Department: Payer: No Typology Code available for payment source

## 2022-04-06 ENCOUNTER — Inpatient Hospital Stay
Admission: EM | Admit: 2022-04-06 | Discharge: 2022-04-11 | DRG: 871 | Disposition: A | Discharge status: Home Health | Payer: No Typology Code available for payment source | Attending: Internal Medicine | Admitting: Internal Medicine

## 2022-04-06 ENCOUNTER — Other Ambulatory Visit: Payer: Self-pay

## 2022-04-06 DIAGNOSIS — Z91041 Radiographic dye allergy status: Secondary | ICD-10-CM

## 2022-04-06 DIAGNOSIS — Z85828 Personal history of other malignant neoplasm of skin: Secondary | ICD-10-CM | POA: Diagnosis not present

## 2022-04-06 DIAGNOSIS — E1151 Type 2 diabetes mellitus with diabetic peripheral angiopathy without gangrene: Secondary | ICD-10-CM | POA: Diagnosis present

## 2022-04-06 DIAGNOSIS — Z6841 Body Mass Index (BMI) 40.0 and over, adult: Secondary | ICD-10-CM

## 2022-04-06 DIAGNOSIS — Z95828 Presence of other vascular implants and grafts: Secondary | ICD-10-CM | POA: Diagnosis not present

## 2022-04-06 DIAGNOSIS — L03115 Cellulitis of right lower limb: Secondary | ICD-10-CM | POA: Diagnosis present

## 2022-04-06 DIAGNOSIS — Z7985 Long-term (current) use of injectable non-insulin antidiabetic drugs: Secondary | ICD-10-CM

## 2022-04-06 DIAGNOSIS — J9601 Acute respiratory failure with hypoxia: Secondary | ICD-10-CM | POA: Diagnosis not present

## 2022-04-06 DIAGNOSIS — Z8616 Personal history of COVID-19: Secondary | ICD-10-CM | POA: Diagnosis not present

## 2022-04-06 DIAGNOSIS — I2782 Chronic pulmonary embolism: Secondary | ICD-10-CM | POA: Diagnosis present

## 2022-04-06 DIAGNOSIS — E1122 Type 2 diabetes mellitus with diabetic chronic kidney disease: Secondary | ICD-10-CM | POA: Diagnosis present

## 2022-04-06 DIAGNOSIS — N1831 Chronic kidney disease, stage 3a: Secondary | ICD-10-CM | POA: Diagnosis present

## 2022-04-06 DIAGNOSIS — Z881 Allergy status to other antibiotic agents status: Secondary | ICD-10-CM

## 2022-04-06 DIAGNOSIS — D689 Coagulation defect, unspecified: Secondary | ICD-10-CM | POA: Diagnosis not present

## 2022-04-06 DIAGNOSIS — N179 Acute kidney failure, unspecified: Secondary | ICD-10-CM | POA: Diagnosis present

## 2022-04-06 DIAGNOSIS — Z888 Allergy status to other drugs, medicaments and biological substances status: Secondary | ICD-10-CM

## 2022-04-06 DIAGNOSIS — A419 Sepsis, unspecified organism: Secondary | ICD-10-CM | POA: Diagnosis not present

## 2022-04-06 DIAGNOSIS — I130 Hypertensive heart and chronic kidney disease with heart failure and stage 1 through stage 4 chronic kidney disease, or unspecified chronic kidney disease: Secondary | ICD-10-CM | POA: Diagnosis present

## 2022-04-06 DIAGNOSIS — I5032 Chronic diastolic (congestive) heart failure: Secondary | ICD-10-CM | POA: Diagnosis present

## 2022-04-06 DIAGNOSIS — E785 Hyperlipidemia, unspecified: Secondary | ICD-10-CM | POA: Diagnosis present

## 2022-04-06 DIAGNOSIS — Z7901 Long term (current) use of anticoagulants: Secondary | ICD-10-CM

## 2022-04-06 DIAGNOSIS — K760 Fatty (change of) liver, not elsewhere classified: Secondary | ICD-10-CM | POA: Diagnosis present

## 2022-04-06 DIAGNOSIS — Z882 Allergy status to sulfonamides status: Secondary | ICD-10-CM | POA: Diagnosis not present

## 2022-04-06 DIAGNOSIS — Z8249 Family history of ischemic heart disease and other diseases of the circulatory system: Secondary | ICD-10-CM

## 2022-04-06 DIAGNOSIS — Z20822 Contact with and (suspected) exposure to covid-19: Secondary | ICD-10-CM | POA: Diagnosis present

## 2022-04-06 DIAGNOSIS — D6859 Other primary thrombophilia: Secondary | ICD-10-CM | POA: Diagnosis present

## 2022-04-06 DIAGNOSIS — I4891 Unspecified atrial fibrillation: Secondary | ICD-10-CM | POA: Diagnosis present

## 2022-04-06 DIAGNOSIS — I4892 Unspecified atrial flutter: Secondary | ICD-10-CM | POA: Diagnosis present

## 2022-04-06 DIAGNOSIS — R6520 Severe sepsis without septic shock: Secondary | ICD-10-CM | POA: Diagnosis not present

## 2022-04-06 DIAGNOSIS — I8290 Acute embolism and thrombosis of unspecified vein: Secondary | ICD-10-CM | POA: Diagnosis present

## 2022-04-06 DIAGNOSIS — I2699 Other pulmonary embolism without acute cor pulmonale: Secondary | ICD-10-CM | POA: Diagnosis present

## 2022-04-06 DIAGNOSIS — Z79899 Other long term (current) drug therapy: Secondary | ICD-10-CM

## 2022-04-06 DIAGNOSIS — R600 Localized edema: Secondary | ICD-10-CM

## 2022-04-06 DIAGNOSIS — Z833 Family history of diabetes mellitus: Secondary | ICD-10-CM

## 2022-04-06 DIAGNOSIS — M79604 Pain in right leg: Secondary | ICD-10-CM

## 2022-04-06 DIAGNOSIS — Z7984 Long term (current) use of oral hypoglycemic drugs: Secondary | ICD-10-CM

## 2022-04-06 DIAGNOSIS — Z86711 Personal history of pulmonary embolism: Secondary | ICD-10-CM | POA: Diagnosis present

## 2022-04-06 DIAGNOSIS — I82509 Chronic embolism and thrombosis of unspecified deep veins of unspecified lower extremity: Secondary | ICD-10-CM | POA: Diagnosis present

## 2022-04-06 DIAGNOSIS — Z86718 Personal history of other venous thrombosis and embolism: Secondary | ICD-10-CM | POA: Diagnosis not present

## 2022-04-06 DIAGNOSIS — I2609 Other pulmonary embolism with acute cor pulmonale: Secondary | ICD-10-CM | POA: Diagnosis not present

## 2022-04-06 LAB — CBC WITH DIFFERENTIAL/PLATELET
Abs Immature Granulocytes: 0.09 10*3/uL — ABNORMAL HIGH (ref 0.00–0.07)
Basophils Absolute: 0 10*3/uL (ref 0.0–0.1)
Basophils Relative: 0 %
Eosinophils Absolute: 0 10*3/uL (ref 0.0–0.5)
Eosinophils Relative: 0 %
HCT: 49.8 % (ref 39.0–52.0)
Hemoglobin: 15.8 g/dL (ref 13.0–17.0)
Immature Granulocytes: 1 %
Lymphocytes Relative: 6 %
Lymphs Abs: 0.7 10*3/uL (ref 0.7–4.0)
MCH: 30 pg (ref 26.0–34.0)
MCHC: 31.7 g/dL (ref 30.0–36.0)
MCV: 94.7 fL (ref 80.0–100.0)
Monocytes Absolute: 0.6 10*3/uL (ref 0.1–1.0)
Monocytes Relative: 5 %
Neutro Abs: 10.7 10*3/uL — ABNORMAL HIGH (ref 1.7–7.7)
Neutrophils Relative %: 88 %
Platelets: 262 10*3/uL (ref 150–400)
RBC: 5.26 MIL/uL (ref 4.22–5.81)
RDW: 14.7 % (ref 11.5–15.5)
WBC: 12.1 10*3/uL — ABNORMAL HIGH (ref 4.0–10.5)
nRBC: 0 % (ref 0.0–0.2)

## 2022-04-06 LAB — URINALYSIS, COMPLETE (UACMP) WITH MICROSCOPIC
Bacteria, UA: NONE SEEN
Bilirubin Urine: NEGATIVE
Glucose, UA: >=500 mg/dL — AB
Hgb urine dipstick: NEGATIVE
Ketones, ur: NEGATIVE mg/dL
Leukocytes,Ua: NEGATIVE
Nitrite: NEGATIVE
Protein, ur: NEGATIVE mg/dL
Specific Gravity, Urine: 1.025 (ref 1.005–1.030)
Squamous Epithelial / HPF: NONE SEEN (ref 0–5)
pH: 5 (ref 5.0–8.0)

## 2022-04-06 LAB — COMPREHENSIVE METABOLIC PANEL
ALT: 29 U/L (ref 0–44)
AST: 26 U/L (ref 15–41)
Albumin: 4.1 g/dL (ref 3.5–5.0)
Alkaline Phosphatase: 57 U/L (ref 38–126)
Anion gap: 8 (ref 5–15)
BUN: 13 mg/dL (ref 6–20)
CO2: 23 mmol/L (ref 22–32)
Calcium: 9 mg/dL (ref 8.9–10.3)
Chloride: 106 mmol/L (ref 98–111)
Creatinine, Ser: 1.65 mg/dL — ABNORMAL HIGH (ref 0.61–1.24)
GFR, Estimated: 50 mL/min — ABNORMAL LOW (ref >60)
Glucose, Bld: 129 mg/dL — ABNORMAL HIGH (ref 70–99)
Potassium: 4.3 mmol/L (ref 3.5–5.1)
Sodium: 137 mmol/L (ref 135–145)
Total Bilirubin: 1.3 mg/dL — ABNORMAL HIGH (ref 0.3–1.2)
Total Protein: 8.4 g/dL — ABNORMAL HIGH (ref 6.5–8.1)

## 2022-04-06 LAB — PROTIME-INR
INR: 1.4 — ABNORMAL HIGH (ref 0.8–1.2)
INR: 1.5 — ABNORMAL HIGH (ref 0.8–1.2)
Prothrombin Time: 16.9 seconds — ABNORMAL HIGH (ref 11.4–15.2)
Prothrombin Time: 17.5 seconds — ABNORMAL HIGH (ref 11.4–15.2)

## 2022-04-06 LAB — TYPE AND SCREEN
ABO/RH(D): AB POS
Antibody Screen: NEGATIVE

## 2022-04-06 LAB — RESP PANEL BY RT-PCR (FLU A&B, COVID) ARPGX2
Influenza A by PCR: NEGATIVE
Influenza B by PCR: NEGATIVE
SARS Coronavirus 2 by RT PCR: NEGATIVE

## 2022-04-06 LAB — APTT
aPTT: 29 seconds (ref 24–36)
aPTT: 111 seconds — ABNORMAL HIGH (ref 24–36)

## 2022-04-06 LAB — LACTIC ACID, PLASMA
Lactic Acid, Venous: 2.3 mmol/L (ref 0.5–1.9)
Lactic Acid, Venous: 2.2 mmol/L (ref 0.5–1.9)

## 2022-04-06 LAB — GLUCOSE, CAPILLARY: Glucose-Capillary: 116 mg/dL — ABNORMAL HIGH (ref 70–99)

## 2022-04-06 LAB — FIBRINOGEN: Fibrinogen: 454 mg/dL (ref 210–475)

## 2022-04-06 MED ORDER — HEPARIN BOLUS VIA INFUSION
7500.0000 [IU] | Freq: Once | INTRAVENOUS | Status: AC
  Administered 2022-04-06: 7500 [IU] via INTRAVENOUS
  Filled 2022-04-06: qty 7500

## 2022-04-06 MED ORDER — VANCOMYCIN HCL 1250 MG/250ML IV SOLN
1250.0000 mg | INTRAVENOUS | Status: DC
  Administered 2022-04-07: 1250 mg via INTRAVENOUS
  Filled 2022-04-06: qty 250

## 2022-04-06 MED ORDER — VANCOMYCIN HCL 2000 MG/400ML IV SOLN
2000.0000 mg | Freq: Once | INTRAVENOUS | Status: AC
  Administered 2022-04-06: 2000 mg via INTRAVENOUS
  Filled 2022-04-06: qty 400

## 2022-04-06 MED ORDER — CHLORHEXIDINE GLUCONATE CLOTH 2 % EX PADS
6.0000 | MEDICATED_PAD | Freq: Every day | CUTANEOUS | Status: DC
Start: 2022-04-07 — End: 2022-04-11
  Administered 2022-04-06 – 2022-04-11 (×3): 6 via TOPICAL

## 2022-04-06 MED ORDER — SODIUM CHLORIDE 0.9 % IV SOLN: 2.0000 g | Freq: Once | INTRAVENOUS | Status: DC

## 2022-04-06 MED ORDER — ACETAMINOPHEN 325 MG PO TABS
650.0000 mg | ORAL_TABLET | Freq: Once | ORAL | Status: AC
  Administered 2022-04-06: 650 mg via ORAL
  Filled 2022-04-06: qty 2

## 2022-04-06 MED ORDER — POLYETHYLENE GLYCOL 3350 17 G PO PACK: 17.0000 g | PACK | Freq: Every day | ORAL | Status: DC | PRN

## 2022-04-06 MED ORDER — METRONIDAZOLE 500 MG/100ML IV SOLN
500.0000 mg | Freq: Two times a day (BID) | INTRAVENOUS | Status: DC
  Administered 2022-04-07: 500 mg via INTRAVENOUS
  Filled 2022-04-06: qty 100

## 2022-04-06 MED ORDER — SODIUM CHLORIDE 0.9 % IV BOLUS (SEPSIS)
1000.0000 mL | Freq: Once | INTRAVENOUS | Status: AC
  Administered 2022-04-06: 1000 mL via INTRAVENOUS

## 2022-04-06 MED ORDER — SODIUM CHLORIDE 0.9 % IV SOLN
2.0000 g | Freq: Once | INTRAVENOUS | Status: AC
  Administered 2022-04-06: 2 g via INTRAVENOUS
  Filled 2022-04-06: qty 12.5

## 2022-04-06 MED ORDER — HEPARIN (PORCINE) 25000 UT/250ML-% IV SOLN
2300.0000 [IU]/h | INTRAVENOUS | Status: DC
  Administered 2022-04-06: 1800 [IU]/h via INTRAVENOUS
  Administered 2022-04-07: 1500 [IU]/h via INTRAVENOUS
  Administered 2022-04-08: 2100 [IU]/h via INTRAVENOUS
  Administered 2022-04-08: 1700 [IU]/h via INTRAVENOUS
  Administered 2022-04-09 (×2): 2300 [IU]/h via INTRAVENOUS
  Filled 2022-04-06 (×5): qty 250

## 2022-04-06 MED ORDER — SODIUM CHLORIDE 0.9 % IV BOLUS (SEPSIS)
1000.0000 mL | Freq: Once | INTRAVENOUS | Status: DC
  Administered 2022-04-06: 1000 mL via INTRAVENOUS

## 2022-04-06 MED ORDER — SODIUM CHLORIDE 0.9 % IV SOLN
2.0000 g | Freq: Three times a day (TID) | INTRAVENOUS | Status: DC
  Administered 2022-04-07: 2 g via INTRAVENOUS
  Filled 2022-04-06: qty 2
  Filled 2022-04-06: qty 12.5

## 2022-04-06 MED ORDER — METRONIDAZOLE 500 MG/100ML IV SOLN
500.0000 mg | Freq: Once | INTRAVENOUS | Status: AC
  Administered 2022-04-06: 500 mg via INTRAVENOUS
  Filled 2022-04-06: qty 100

## 2022-04-06 MED ORDER — SODIUM CHLORIDE 0.9 % IV BOLUS: 1000.0000 mL | Freq: Once | INTRAVENOUS | Status: DC

## 2022-04-06 MED ORDER — LACTATED RINGERS IV BOLUS (SEPSIS): 500.0000 mL | Freq: Once | INTRAVENOUS | Status: DC

## 2022-04-06 MED ORDER — SODIUM CHLORIDE 0.9 % IV BOLUS (SEPSIS): 200.0000 mL | Freq: Once | INTRAVENOUS | Status: DC

## 2022-04-06 MED ORDER — VANCOMYCIN HCL IN DEXTROSE 1-5 GM/200ML-% IV SOLN: 1000.0000 mg | Freq: Once | INTRAVENOUS | Status: DC

## 2022-04-06 MED ORDER — SODIUM CHLORIDE 0.9 % IV BOLUS: 500.0000 mL | Freq: Once | INTRAVENOUS | Status: DC

## 2022-04-06 MED ORDER — IOHEXOL 350 MG/ML SOLN
100.0000 mL | Freq: Once | INTRAVENOUS | Status: AC | PRN
  Administered 2022-04-06: 100 mL via INTRAVENOUS

## 2022-04-06 MED ORDER — IPRATROPIUM-ALBUTEROL 0.5-2.5 (3) MG/3ML IN SOLN: 3.0000 mL | Freq: Once | RESPIRATORY_TRACT | Status: DC

## 2022-04-06 MED ORDER — DOCUSATE SODIUM 100 MG PO CAPS: 100.0000 mg | ORAL_CAPSULE | Freq: Two times a day (BID) | ORAL | Status: DC | PRN

## 2022-04-06 NOTE — Progress Notes (Signed)
eLink Physician-Brief Progress Note Patient Name: SAMANTHA OLIVERA DOB: Nov 23, 1970 MRN: [id number]   Date of Service  04/06/2022  HPI/Events of Note  Patient admitted with pulmonary embolism,  acute hypoxemic respiratory failure, and concern for sepsis of as yet undetermined etiology, work up is in progress.  eICU Interventions  New Patient Evaluation.        Thomasene Lot Arora Coakley 04/06/2022, 11:28 PM

## 2022-04-06 NOTE — Progress Notes (Signed)
Elink following code sepsis °

## 2022-04-06 NOTE — Consult Note (Signed)
ANTICOAGULATION CONSULT NOTE - Initial Consult  Pharmacy Consult for Heparin Indication: pulmonary embolus  Allergies  Allergen Reactions   Metrizamide Other (See Comments)    Kidney failure requiring dialysis Kidney failure requiring dialysis    Clindamycin/Lincomycin Rash   Iodinated Contrast Media Other (See Comments)    H/o kidney failure so was told not to receive contrast. NOT ALLERGY.    Lincomycin Rash   Sulfa Antibiotics Rash    Patient Measurements: Height: 5' 7.32" (171 cm) Weight: (!) 161.3 kg (355 lb 9.6 oz) IBW/kg (Calculated) : 66.84 Heparin Dosing Weight: 106.9 kg  Vital Signs: Temp: 101 F (38.3 C) (08/08 1913) Temp Source: Oral (08/08 1913) BP: 106/48 (08/08 1913) Pulse Rate: 104 (08/08 1913)  Labs: Recent Labs    04/06/22 1715  HGB 15.8  HCT 49.8  PLT 262  APTT 29  LABPROT 16.9*  INR 1.4*  CREATININE 1.65*    Estimated Creatinine Clearance: 77.5 mL/min (A) (by C-G formula based on SCr of 1.65 mg/dL (H)).   Medical History: Past Medical History:  Diagnosis Date   CHF (congestive heart failure) ([facility])    Diabetes mellitus without complication ([facility])    per pt-pre diabetic-Dr Dario Guardian stated said pt   DVT (deep venous thrombosis) ([facility])    Hepatic steatosis    Hypertension    Morbid obesity with BMI of 50.0-59.9, adult ([facility])    Peripheral vascular disease ([facility])    Presence of IVC filter    Ventricular trigeminy    Weakness of both lower limbs     Medications:  No history of chronic AC use PTA  Assessment: Pharmacy has been consulted to initiate and monitor heparin infusion in 51yo male presenting to the ED with chills. CT angio of chest positive for PE.   Baseline labs: aPTT 29 sec, INR 1.4, Hgb 15.8, Plts 262  Goal of Therapy:  Heparin level 0.3-0.7 units/ml Monitor platelets by anticoagulation protocol: Yes   Plan:  Give 7500 units bolus x 1 Start heparin infusion at 1800 units/hr Check anti-Xa level in 6 hours and daily  while on heparin Continue to monitor H&H and platelets  Bettey Costa, PharmD Clinical Pharmacist 04/06/2022 7:57 PM

## 2022-04-06 NOTE — ED Notes (Signed)
Report given to [facility], [place], on unit

## 2022-04-06 NOTE — Progress Notes (Signed)
CODE SEPSIS - PHARMACY COMMUNICATION  **Broad Spectrum Antibiotics should be administered within 1 hour of Sepsis diagnosis**  Time Code Sepsis Called/Page Received: 1709  Antibiotics Ordered: cefepime, vancomycin  Time of 1st antibiotic administration: 1730  Additional action taken by pharmacy: none required  If necessary, Name of Provider/Nurse Contacted: N/A    Lowella Bandy ,PharmD Clinical Pharmacist  04/06/2022  5:02 PM

## 2022-04-06 NOTE — Progress Notes (Signed)
Pharmacy Antibiotic Note  Ryan [name] is a 51 y.o. male w/ PMH of HTN, CKD, HLD, CHF, recurrent VTE, DM, PNA admitted on 04/06/2022 with sepsis.  Pharmacy has been consulted for vancomycin and cefepime dosing. Renal function appears to be at baseline  Plan:  1) start cefepime 2 grams IV every 8 hours  2) start vancomycin 1250 mg IV every 24 hours (2000 mg IV administered in the ED) Ke 0.032 h-1, T1/2 24.4 h Goal AUC 400-550. Expected AUC: 480.6 SCr used: 1.65 mg/dL   Height: 5' 6.26" (948 cm) Weight: (!) 161.3 kg (355 lb 9.6 oz) IBW/kg (Calculated) : 66.84  Temp (24hrs), Avg:101.8 F (38.8 C), Min:101 F (38.3 C), Max:102.6 F (39.2 C)  Recent Labs  Lab 04/06/22 1714 04/06/22 1715  WBC  --  12.1*  CREATININE  --  1.65*  LATICACIDVEN 2.3*  --     Estimated Creatinine Clearance: 77.5 mL/min (A) (by C-G formula based on SCr of 1.65 mg/dL (H)).    Allergies  Allergen Reactions   Metrizamide Other (See Comments)    Kidney failure requiring dialysis Kidney failure requiring dialysis    Clindamycin/Lincomycin Rash   Iodinated Contrast Media Other (See Comments)    H/o kidney failure so was told not to receive contrast. NOT ALLERGY.    Lincomycin Rash   Sulfa Antibiotics Rash    Antimicrobials this admission: 08/08 vancomycin >>  08/08 metronidazole >> 08/08 cefepime >>   Microbiology results: 08/08 BCx: pending 08/08 UCx: pending   Thank you for allowing pharmacy to be a part of this patient's care.  Lowella Bandy 04/06/2022 8:04 PM

## 2022-04-06 NOTE — H&P (Incomplete)
NAME:  Ryan [name], MRN:  [id number], DOB:  03/29/1971, LOS: 0 ADMISSION DATE:  04/06/2022, CONSULTATION DATE:  04/06/22 REFERRING MD: Shaune PollackIsaacs, Cameron, CHIEF COMPLAINT:  SOB, Fevers and Chills    HPI  Ryan [name] is a 51y.o. male with medical history significant of HTN, pre-DM, with recurrent DVT/PE s/p IVC filter x2 (2005, 2012 on Xarelto, dCHF,, PVD, CKD stage IIIa, atrial fibrillation (s/p of ablation per pt, triggered by COVID infection,recurrent pneumonia, Cellulitis of right foot who presented to the ED with chief complaints of SOB, RLE pain, fevers and chills.  ED Course: In the emergency department, the temperature was 101 F (38.3 C), the heart rate104  beats/minute, the blood pres(!) 106/48sure  mm Hg, the respiratory rate 24 breaths/minute, and the oxygen saturation 94% on 2L.   Pertinent Labs/Diagnostics Findings: Chemistry:Na+/ K+: 137/4.3 Glucose: 129 BUN/Cr.:13/1.65  CBC: WBC:12.1  Other Lab findings: PCT: Lactic acid: 2.3, BNP: pending  Imaging:  CXR> No acute intrathoracic process.CTA Chest> Positive for pulmonary embolism in the left lower lobe to lingular Branches. CT Abd/pelvis>No acute abdominal inflammatory finding  Patient given 30 cc/kg of fluids and started on broad-spectrum antibiotics Vanco cefepime and Flagyl for suspected severe sepsis. Due to high risk for decompensation and possible requiring intubation and vasopressors, PCCM consulted for admission.  Past Medical History  Cellulitis of right foot DVT (deep venous thrombosis) (HCC) s/p IVC Filter Chronic diastolic CHF (congestive heart failure) (HCC) HTN (hypertension) Pulmonary embolus (HCC) on Xarelto CKD (chronic kidney disease), stage IIIa PVD COVID infection Atrial Fibrillation s/p ablation  Significant Hospital Events   8/8:Admitted to ICU with sepsis of unknown source and recurrent VTE.  Consults:  Vascular Surgery  Procedures:  None  Significant Diagnostic Tests:  : Chest Xray> :  Abdominal xray> : Noncontrast CT head> : CTA abdomen and pelvis> : CTA Chest>  Micro Data:  : SARS-CoV-2 PCR> negative : Influenza PCR> negative : Blood culture x2> : Urine Culture> : MRSA PCR>>  : Strep pneumo urinary antigen> : Legionella urinary antigen>  Antimicrobials:  Vancomycin Cefepime Azithromycin Ceftriaxone Metronidazole  OBJECTIVE  Blood pressure (!) 145/78, pulse (!) 102, temperature (!) 100.9 F (38.3 C), temperature source Oral, resp. rate (!) 25, height 5' 7.32" (1.71 m), weight (!) 161.3 kg, SpO2 96 %.        Intake/Output Summary (Last 24 hours) at 04/06/2022 2134 Last data filed at 04/06/2022 1800 Gross per 24 hour  Intake 99.11 ml  Output --  Net 99.11 ml   Filed Weights   04/06/22 1657  Weight: (!) 161.3 kg     Physical Examination  GENERAL: year-old critically ill patient lying in the bed with no acute distress.  EYES: Pupils equal, round, reactive to light and accommodation. No scleral icterus. Extraocular muscles intact.  HEENT: Head atraumatic, normocephalic. Oropharynx and nasopharynx clear.  NECK:  Supple, no jugular venous distention. No thyroid enlargement, no tenderness.  LUNGS: Normal breath sounds bilaterally, no wheezing, rales,rhonchi or crepitation. No use of accessory muscles of respiration.  CARDIOVASCULAR: S1, S2 normal. No murmurs, rubs, or gallops.  ABDOMEN: Soft, nontender, nondistended. Bowel sounds present. No organomegaly or mass.  EXTREMITIES: No pedal edema, cyanosis, or clubbing.  NEUROLOGIC: Cranial nerves II through XII are intact.  Muscle strength 5/5 in all extremities. Sensation intact. Gait not checked.  PSYCHIATRIC: The patient is alert and oriented x 3.  SKIN: No obvious rash, lesion, or ulcer.   Labs/imaging that I {ACTIONS; HAVE/HAVE NOT:19434}personally reviewed  (right click  and "Reselect all SmartList Selections" daily)     Labs   CBC: Recent Labs  Lab 04/06/22 1715  WBC 12.1*  NEUTROABS 10.7*   HGB 15.8  HCT 49.8  MCV 94.7  PLT 262    Basic Metabolic Panel: Recent Labs  Lab 04/06/22 1715  NA 137  K 4.3  CL 106  CO2 23  GLUCOSE 129*  BUN 13  CREATININE 1.65*  CALCIUM 9.0   GFR: Estimated Creatinine Clearance: 77.5 mL/min (A) (by C-G formula based on SCr of 1.65 mg/dL (H)). Recent Labs  Lab 04/06/22 1714 04/06/22 1715  WBC  --  12.1*  LATICACIDVEN 2.3*  --     Liver Function Tests: Recent Labs  Lab 04/06/22 1715  AST 26  ALT 29  ALKPHOS 57  BILITOT 1.3*  PROT 8.4*  ALBUMIN 4.1   No results for input(s): "LIPASE", "AMYLASE" in the last 168 hours. No results for input(s): "AMMONIA" in the last 168 hours.  ABG No results found for: "PHART", "PCO2ART", "PO2ART", "HCO3", "TCO2", "ACIDBASEDEF", "O2SAT"   Coagulation Profile: Recent Labs  Lab 04/06/22 1715  INR 1.4*    Cardiac Enzymes: No results for input(s): "CKTOTAL", "CKMB", "CKMBINDEX", "TROPONINI" in the last 168 hours.  HbA1C: Hemoglobin A1C  Date/Time Value Ref Range Status  12/26/2014 05:20 AM 6.1 (H) % Final    Comment:    4.0-6.0 NOTE: New Reference Range  11/05/14   12/25/2014 03:05 AM 5.9 % Final    Comment:    4.0-6.0 NOTE: New Reference Range  11/05/14    Hgb A1c MFr Bld  Date/Time Value Ref Range Status  03/18/2022 02:38 PM 6.5 (H) 4.8 - 5.6 % Final    Comment:             Prediabetes: 5.7 - 6.4          Diabetes: >6.4          Glycemic control for adults with diabetes: <7.0   02/03/2022 08:57 AM 6.4 (H) 4.8 - 5.6 % Final    Comment:             Prediabetes: 5.7 - 6.4          Diabetes: >6.4          Glycemic control for adults with diabetes: <7.0     CBG: No results for input(s): "GLUCAP" in the last 168 hours.  Review of Systems:   ***  Past Medical History  He,  has a past medical history of CHF (congestive heart failure) (HCC), Diabetes mellitus without complication (HCC), DVT (deep venous thrombosis) (HCC), Hepatic steatosis, Hypertension, Morbid  obesity with BMI of 50.0-59.9, adult (HCC), Peripheral vascular disease (HCC), Presence of IVC filter, Ventricular trigeminy, and Weakness of both lower limbs.   Surgical History    Past Surgical History:  Procedure Laterality Date   CARDIAC ELECTROPHYSIOLOGY STUDY AND ABLATION  10/2020   PERIPHERAL VASCULAR CATHETERIZATION N/A 01/10/2015   Procedure: Dialysis/Perma Catheter Insertion;  Surgeon: Renford Dills, MD;  Location: [facility] INVASIVE CV LAB;  Service: Cardiovascular;  Laterality: N/A;   PERIPHERAL VASCULAR CATHETERIZATION N/A 02/24/2015   Procedure: Dialysis/Perma Catheter Removal;  Surgeon: Annice Needy, MD;  Location: [facility] INVASIVE CV LAB;  Service: Cardiovascular;  Laterality: N/A;     Social History   reports that he has never smoked. He has never used smokeless tobacco. He reports that he does not drink alcohol and does not use drugs.   Family History   His family history  includes Breast cancer in his mother; Cancer in his mother; Diabetes in his father; Hypertension in his father and mother; Prostate cancer in his father; Pulmonary embolism in his paternal grandfather and paternal uncle.   Allergies Allergies  Allergen Reactions   Metrizamide Other (See Comments)    Kidney failure requiring dialysis Kidney failure requiring dialysis    Clindamycin/Lincomycin Rash   Iodinated Contrast Media Other (See Comments)    H/o kidney failure so was told not to receive contrast. NOT ALLERGY.    Lincomycin Rash   Sulfa Antibiotics Rash     Home Medications  Prior to Admission medications   Medication Sig Start Date End Date Taking? Authorizing Provider  albuterol (VENTOLIN HFA) 108 (90 Base) MCG/ACT inhaler Inhale 2 puffs into the lungs every 6 (six) hours as needed for wheezing or shortness of breath. 02/23/22   Emeterio Reeve, DO  empagliflozin (JARDIANCE) 25 MG TABS tablet Take 1 tablet (25 mg total) by mouth daily before breakfast. 12/09/21   Wynetta Emery, Megan P, DO   furosemide (LASIX) 40 MG tablet Take 1 tablet (40 mg total) by mouth daily. 01/28/22 04/28/22  Mercy Riding, MD  losartan (COZAAR) 50 MG tablet Take 1 tablet (50 mg total) by mouth daily. 02/03/22   Jon Billings, NP  magnesium oxide (MAG-OX) 400 (240 Mg) MG tablet Take 400 mg by mouth daily.    [provider]  metoprolol succinate (TOPROL-XL) 25 MG 24 hr tablet Take 1 tablet (25 mg total) by mouth daily. 02/23/22   Emeterio Reeve, DO  rivaroxaban (XARELTO) 20 MG TABS tablet Take 20 mg by mouth daily.    [provider]  tirzepatide Darcel Bayley) 2.5 MG/0.5ML Pen Inject 2.5 mg into the skin once a week. Patient not taking: Reported on 03/18/2022 02/03/22   Jon Billings, NP      Active Hospital Problem list     Assessment & Plan:   Sepsis of unknown source Hx of recurrent pneumonia, concerns for septic emboli? Lactic: ***, Baseline PCT: ***, UA: ***, CXR: ***, CT: ***  Initial interventions/workup included: *** L of NS/LR*** & Cefepime/ Vancomycin/ Metronidazole*** -Supplemental oxygen as needed, to maintain SpO2 > 90% -f/u cultures, trend lactic/ PCT -Monitor WBC/ fever curve -IV antibiotics: cefepime (HCAP/UTI)*** & vancomycin *** ceftriaxone (CAP) *** zosyn (intra-abdominal) *** unasyn (aspiration PNA) -IVF hydration as needed: *** -Pressors for MAP goal >65 -Strict I/O's  Acute Hypoxic Respiratory Failure in the setting of acute on chronic PE as below.  On admission, requiring 6 L nasal cannula to maintain saturations >90%.  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  -Follows with Hematology (Dr. Joan Flores) at [facility] -heparin thrombosis nomogram  -f/u repeat TTE -hold home xarelto for now -vascular surgery consult  for possible IVC filter removal?  H/o HFmrEF Most recent TTE 09/2020 with EF 55-60%.  Hypertension Has had SBPs >. Treating with home chlorthalidone, started entresto, labetalol as below. Holding home losartan.  -Hold Entresto, spironolactone (25 mg daily), chlorthalidone (25 mg daily), losartan -Start hydralazine 10 mg  every 6 hours prn for SBP >180  H/o Afib/flutter s/p Ablation in 11/2020 - Hold home xarelto - Hold home metoprolol  AKI on CKD3 Has been at or below baseline creatinine of 1.5.  -Monitor I&O's / urinary output -Follow BMP -Ensure adequate renal perfusion -Avoid nephrotoxic agents as able -Replace electrolytes as indicated    Best practice:  Diet:  {LKGM:01027} Pain/Anxiety/Delirium protocol (if indicated): {Pain/Anxiety/Delirium:26941} VAP protocol (if indicated): {VAP:29640} DVT prophylaxis: {DVT Prophylaxis:26933} GI prophylaxis: {GI:26934} Glucose control:  {Glucose Control:26935} Central venous access:  {Central Venous Access:26936} Arterial line:  {Central Venous Access:26936} Foley:  {Central Venous Access:26936} Mobility:  {Mobility:26937}  PT consulted: {PT Consult:26938} Last date of multidisciplinary goals of care discussion [***] Code Status:  {Code Status:26939} Disposition: ***   = Goals of Care = Code Status Order: @CODE @   Primary Emergency Contact: Kayshaun, [place], Home Phone: [phone] Wishes to pursue full aggressive treatment and intervention options, including CPR and intubation, but goals of care will be addressed on going with family if that should become necessary.  Critical care time: 45 minutes       [phone], DNP, CCRN, FNP-C, AGACNP-BC Acute Care Nurse Practitioner [facility]  PCCM on call pager [phone] until 7 am

## 2022-04-06 NOTE — ED Triage Notes (Signed)
Pt here via [facility] for fever and chills. Pt also having right upper thigh pain, pt has a IVC in both legs. Pt is on a blood thinner, pt also had sepsis last month and was admitted, unknown cause.   102.7 120 131/82 80s on RA

## 2022-04-06 NOTE — Progress Notes (Signed)
PHARMACY -  BRIEF ANTIBIOTIC NOTE   Pharmacy has received consult(s) for vancomycin and cefepime from an ED provider.  The patient's profile has been reviewed for ht/wt/allergies/indication/available labs.    One time order(s) placed for   1) cefepime 2 grams IV x 1  2) vancomycin 2000 mg IV x 1  Further antibiotics/pharmacy consults should be ordered by admitting physician if indicated.                       Thank you, Lowella Bandy 04/06/2022  5:03 PM

## 2022-04-06 NOTE — ED Provider Notes (Signed)
[facility] Provider Note    None    (approximate)   History   Fever   HPI  Ryan [name] is a 51 y.o. male with extensive past medical history including hypertension, CKD, hyperlipidemia, chronic heart failure, recurrent VTE, diabetes, recurrent pneumonia, here with chills. Pt states around 12 today, he began feeling fever, chills, body aches, some SOB. H/o sepsis in past with similar sx. H/o DVT as well and has had IVC filter places. Pain in RLE noted today as well. No trauma. Pt does not wear O2 at home and was placed on 2L on arrival due to desats in high 80s.  Interestingly, patient states that he has had similar presentation in septic before.  He is also noticing pain in his right groin area.  Denies any testicular pain.  Urinary symptoms.      Physical Exam   Triage Vital Signs: ED Triage Vitals  Enc Vitals Group     BP      Pulse      Resp      Temp      Temp src      SpO2      Weight      Height      Head Circumference      Peak Flow      Pain Score      Pain Loc      Pain Edu?      Excl. in GC?     Most recent vital signs: Vitals:   04/06/22 1800 04/06/22 1913  BP: 127/73 (!) 106/48  Pulse: (!) 101 (!) 104  Resp: (!) 32 (!) 24  Temp:  (!) 101 F (38.3 C)  SpO2: 100% 94%     General: Awake, respiratory distress. CV:  Good peripheral perfusion.  Tachycardic.  Regular. Resp:  Normal effort.  Tachypnea, overall normal aeration however. Abd:  No distention.  Minimal suprapubic and right lower quadrant tenderness. Other:  Tenderness to palpation in the right inguinal, particularly medially.  He has chronic venous stasis changes to lower extremities but is significantly symptomatic.   ED Results / Procedures / Treatments   Labs (all labs ordered are listed, but only abnormal results are displayed) Labs Reviewed  COMPREHENSIVE METABOLIC PANEL - Abnormal; Notable for the following components:      Result Value   Glucose,  Bld 129 (*)    Creatinine, Ser 1.65 (*)    Total Protein 8.4 (*)    Total Bilirubin 1.3 (*)    GFR, Estimated 50 (*)    All other components within normal limits  LACTIC ACID, PLASMA - Abnormal; Notable for the following components:   Lactic Acid, Venous 2.3 (*)    All other components within normal limits  CBC WITH DIFFERENTIAL/PLATELET - Abnormal; Notable for the following components:   WBC 12.1 (*)    Neutro Abs 10.7 (*)    Abs Immature Granulocytes 0.09 (*)    All other components within normal limits  PROTIME-INR - Abnormal; Notable for the following components:   Prothrombin Time 16.9 (*)    INR 1.4 (*)    All other components within normal limits  URINALYSIS, COMPLETE (UACMP) WITH MICROSCOPIC - Abnormal; Notable for the following components:   Color, Urine YELLOW (*)    APPearance CLEAR (*)    Glucose, UA >=500 (*)    All other components within normal limits  RESP PANEL BY RT-PCR (FLU A&B, COVID) ARPGX2  CULTURE, BLOOD (ROUTINE  X 2)  CULTURE, BLOOD (ROUTINE X 2)  URINE CULTURE  APTT  LACTIC ACID, PLASMA  HEPARIN LEVEL (UNFRACTIONATED)     EKG Normal sinus rhythm subcuticulars 98.  PR 154, QRS 72, QTc no acute ST elevations or depressions.  No evidence of acute ischemia or infarct.   RADIOLOGY Chest x-ray: Clear   I also independently reviewed and agree with radiologist interpretations.   PROCEDURES:  Critical Care performed: Yes, see critical care procedure note(s)  .Critical Care  Performed by: Shaune Pollack, MD Authorized by: Shaune Pollack, MD   Critical care provider statement:    Critical care time (minutes):  30   Critical care time was exclusive of:  Separately billable procedures and treating other patients   Critical care was necessary to treat or prevent imminent or life-threatening deterioration of the following conditions:  Cardiac failure, circulatory failure and respiratory failure   Critical care was time spent personally by me on the  following activities:  Development of treatment plan with patient or surrogate, discussions with consultants, evaluation of patient's response to treatment, examination of patient, ordering and review of laboratory studies, ordering and review of radiographic studies, ordering and performing treatments and interventions, pulse oximetry, re-evaluation of patient's condition and review of old charts     MEDICATIONS ORDERED IN ED: Medications  vancomycin (VANCOREADY) IVPB 2000 mg/400 mL (2,000 mg Intravenous New Bag/Given 04/06/22 1929)  sodium chloride 0.9 % bolus 1,000 mL (has no administration in time range)  ipratropium-albuterol (DUONEB) 0.5-2.5 (3) MG/3ML nebulizer solution 3 mL (has no administration in time range)  sodium chloride 0.9 % bolus 1,000 mL (has no administration in time range)  sodium chloride 0.9 % bolus 500 mL (has no administration in time range)  heparin bolus via infusion 7,500 Units (has no administration in time range)  heparin ADULT infusion 100 units/mL (25000 units/271mL) (has no administration in time range)  sodium chloride 0.9 % bolus 1,000 mL (0 mLs Intravenous Stopped 04/06/22 1929)  ceFEPIme (MAXIPIME) 2 g in sodium chloride 0.9 % 100 mL IVPB (0 g Intravenous Stopped 04/06/22 1800)  metroNIDAZOLE (FLAGYL) IVPB 500 mg (0 mg Intravenous Stopped 04/06/22 1929)  acetaminophen (TYLENOL) tablet 650 mg (650 mg Oral Given 04/06/22 1730)  iohexol (OMNIPAQUE) 350 MG/ML injection 100 mL (100 mLs Intravenous Contrast Given 04/06/22 1829)     IMPRESSION / MDM / ASSESSMENT AND PLAN / ED COURSE  I reviewed the triage vital signs and the nursing notes.                               The patient is on the cardiac monitor to evaluate for evidence of arrhythmia and/or significant heart rate changes.   Ddx:  Differential includes the following, with pertinent life- or limb-threatening emergencies considered:  Acute sepsis, unclear source but differential includes pneumonia, UTI,  bloodstream infection, possibly endocarditis or infected filter, COVID, recurrent PE and DVT  Patient's presentation is most consistent with acute presentation with potential threat to life or bodily function.  MDM:   51 y.o. male with extensive past medical history including hypertension, CKD, hyperlipidemia, chronic heart failure, recurrent VTE, diabetes, recurrent pneumonia here with fever, hypoxia.  Patient arrives febrile, tachycardic, ill-appearing.  He was started on broad-spectrum antibiotics and IV fluids.  Chest x-ray negative.  Primary concern is severe sepsis with significant hypoxia and borderline hypotension.  However, no apparent source is identified.  Get some pain right morning, which would  be more concerning for possible DVT given his history and on exam there is no evidence to suggest cellulitis or other infection.  No testicular pain or signs to suggest GU pathology.  Patient sent for stat CT angio as well as abdomen and pelvis.  CT angio interestingly shows new PEs.  No acute abnormality in the abdomen or pelvis.  Patient started on heparin and will admit to the ICU given his clinical stability.  Primary concern at this time is either recurrent PE or even potentially septic emboli in the setting of his recurrent severe sepsis.  The acuity of onset and recurrence of symptoms.  Will notify intensivist.  Admit to medicine.   MEDICATIONS GIVEN IN ED: Medications  vancomycin (VANCOREADY) IVPB 2000 mg/400 mL (2,000 mg Intravenous New Bag/Given 04/06/22 1929)  sodium chloride 0.9 % bolus 1,000 mL (has no administration in time range)  ipratropium-albuterol (DUONEB) 0.5-2.5 (3) MG/3ML nebulizer solution 3 mL (has no administration in time range)  sodium chloride 0.9 % bolus 1,000 mL (has no administration in time range)  sodium chloride 0.9 % bolus 500 mL (has no administration in time range)  heparin bolus via infusion 7,500 Units (has no administration in time range)  heparin ADULT  infusion 100 units/mL (25000 units/272mL) (has no administration in time range)  sodium chloride 0.9 % bolus 1,000 mL (0 mLs Intravenous Stopped 04/06/22 1929)  ceFEPIme (MAXIPIME) 2 g in sodium chloride 0.9 % 100 mL IVPB (0 g Intravenous Stopped 04/06/22 1800)  metroNIDAZOLE (FLAGYL) IVPB 500 mg (0 mg Intravenous Stopped 04/06/22 1929)  acetaminophen (TYLENOL) tablet 650 mg (650 mg Oral Given 04/06/22 1730)  iohexol (OMNIPAQUE) 350 MG/ML injection 100 mL (100 mLs Intravenous Contrast Given 04/06/22 1829)     Consults:  Intensivist   EMR reviewed  Reviewed recent admission at the end of June, patient was admitted for acute respiratory failure secondary to pneumonia, CHF     FINAL CLINICAL IMPRESSION(S) / ED DIAGNOSES   Final diagnoses:  Sepsis without acute organ dysfunction, due to unspecified organism ([place])  Acute respiratory failure with hypoxia ([place])     Rx / DC Orders   ED Discharge Orders     None        Note:  This document was prepared using Dragon voice recognition software and may include unintentional dictation errors.   Duffy Bruce, MD 04/06/22 [phone]

## 2022-04-06 NOTE — Progress Notes (Signed)
Notified bedside nurse of need to draw repeat lactic acid @ 1858 since it is during shift change .

## 2022-04-07 ENCOUNTER — Encounter: Payer: Self-pay | Admitting: Internal Medicine

## 2022-04-07 ENCOUNTER — Inpatient Hospital Stay: Payer: No Typology Code available for payment source

## 2022-04-07 DIAGNOSIS — J9601 Acute respiratory failure with hypoxia: Secondary | ICD-10-CM

## 2022-04-07 LAB — BASIC METABOLIC PANEL
Anion gap: 7 (ref 5–15)
BUN: 14 mg/dL (ref 6–20)
CO2: 21 mmol/L — ABNORMAL LOW (ref 22–32)
Calcium: 8.4 mg/dL — ABNORMAL LOW (ref 8.9–10.3)
Chloride: 110 mmol/L (ref 98–111)
Creatinine, Ser: 1.56 mg/dL — ABNORMAL HIGH (ref 0.61–1.24)
GFR, Estimated: 53 mL/min — ABNORMAL LOW (ref >60)
Glucose, Bld: 125 mg/dL — ABNORMAL HIGH (ref 70–99)
Potassium: 3.9 mmol/L (ref 3.5–5.1)
Sodium: 138 mmol/L (ref 135–145)

## 2022-04-07 LAB — PHOSPHORUS: Phosphorus: 2.7 mg/dL (ref 2.5–4.6)

## 2022-04-07 LAB — STREP PNEUMONIAE URINARY ANTIGEN: Strep Pneumo Urinary Antigen: NEGATIVE

## 2022-04-07 LAB — CBC WITH DIFFERENTIAL/PLATELET
Abs Immature Granulocytes: 0.2 10*3/uL — ABNORMAL HIGH (ref 0.00–0.07)
Basophils Absolute: 0 10*3/uL (ref 0.0–0.1)
Basophils Relative: 0 %
Eosinophils Absolute: 0 10*3/uL (ref 0.0–0.5)
Eosinophils Relative: 0 %
HCT: 47.1 % (ref 39.0–52.0)
Hemoglobin: 15.1 g/dL (ref 13.0–17.0)
Immature Granulocytes: 1 %
Lymphocytes Relative: 6 %
Lymphs Abs: 1.1 10*3/uL (ref 0.7–4.0)
MCH: 30.1 pg (ref 26.0–34.0)
MCHC: 32.1 g/dL (ref 30.0–36.0)
MCV: 93.8 fL (ref 80.0–100.0)
Monocytes Absolute: 0.8 10*3/uL (ref 0.1–1.0)
Monocytes Relative: 5 %
Neutro Abs: 15.8 10*3/uL — ABNORMAL HIGH (ref 1.7–7.7)
Neutrophils Relative %: 88 %
Platelets: 239 10*3/uL (ref 150–400)
RBC: 5.02 MIL/uL (ref 4.22–5.81)
RDW: 14.9 % (ref 11.5–15.5)
Smear Review: NORMAL
WBC: 18.7 10*3/uL — ABNORMAL HIGH (ref 4.0–10.5)
nRBC: 0 % (ref 0.0–0.2)

## 2022-04-07 LAB — HEPARIN LEVEL (UNFRACTIONATED)
Heparin Unfractionated: 1.06 IU/mL — ABNORMAL HIGH (ref 0.30–0.70)
Heparin Unfractionated: 0.58 IU/mL (ref 0.30–0.70)
Heparin Unfractionated: 0.27 IU/mL — ABNORMAL LOW (ref 0.30–0.70)

## 2022-04-07 LAB — CBC
HCT: 45.5 % (ref 39.0–52.0)
Hemoglobin: 14.9 g/dL (ref 13.0–17.0)
MCH: 30.2 pg (ref 26.0–34.0)
MCHC: 32.7 g/dL (ref 30.0–36.0)
MCV: 92.1 fL (ref 80.0–100.0)
Platelets: 229 10*3/uL (ref 150–400)
RBC: 4.94 MIL/uL (ref 4.22–5.81)
RDW: 14.8 % (ref 11.5–15.5)
WBC: 19 10*3/uL — ABNORMAL HIGH (ref 4.0–10.5)
nRBC: 0 % (ref 0.0–0.2)

## 2022-04-07 LAB — BRAIN NATRIURETIC PEPTIDE: B Natriuretic Peptide: 34.6 pg/mL (ref 0.0–100.0)

## 2022-04-07 LAB — MRSA NEXT GEN BY PCR, NASAL: MRSA by PCR Next Gen: NOT DETECTED

## 2022-04-07 LAB — PROCALCITONIN
Procalcitonin: 2.27 ng/mL
Procalcitonin: 2.79 ng/mL

## 2022-04-07 LAB — TROPONIN I (HIGH SENSITIVITY): Troponin I (High Sensitivity): 16 ng/L (ref <18)

## 2022-04-07 LAB — CORTISOL: Cortisol, Plasma: 22.4 ug/dL

## 2022-04-07 LAB — MAGNESIUM: Magnesium: 1.7 mg/dL (ref 1.7–2.4)

## 2022-04-07 MED ORDER — MAGNESIUM SULFATE 2 GM/50ML IV SOLN
2.0000 g | Freq: Once | INTRAVENOUS | Status: AC
Start: 2022-04-07 — End: 2022-04-07
  Administered 2022-04-07: 2 g via INTRAVENOUS
  Filled 2022-04-07: qty 50

## 2022-04-07 MED ORDER — ACETAMINOPHEN 500 MG PO TABS
1000.0000 mg | ORAL_TABLET | Freq: Four times a day (QID) | ORAL | Status: DC | PRN
Start: 2022-04-07 — End: 2022-04-08
  Administered 2022-04-07: 1000 mg via ORAL
  Filled 2022-04-07: qty 2

## 2022-04-07 MED ORDER — ALUM & MAG HYDROXIDE-SIMETH 200-200-20 MG/5ML PO SUSP
30.0000 mL | Freq: Once | ORAL | Status: AC
  Administered 2022-04-07: 30 mL via ORAL
  Filled 2022-04-07: qty 30

## 2022-04-07 MED ORDER — HEPARIN BOLUS VIA INFUSION
1600.0000 [IU] | Freq: Once | INTRAVENOUS | Status: AC
  Administered 2022-04-07: 1600 [IU] via INTRAVENOUS
  Filled 2022-04-07: qty 1600

## 2022-04-07 MED ORDER — CEFAZOLIN SODIUM-DEXTROSE 2-4 GM/100ML-% IV SOLN
2.0000 g | Freq: Three times a day (TID) | INTRAVENOUS | Status: AC
  Administered 2022-04-07 – 2022-04-09 (×8): 2 g via INTRAVENOUS
  Filled 2022-04-07 (×8): qty 100

## 2022-04-07 NOTE — Progress Notes (Signed)
CHIEF COMPLAINT:   Chief Complaint  Patient presents with   Fever  B/l PE  Subjective  Alert and awake Minimal oxygen Plan for VASC surgery to assess removal of IVC filter No chest pain, minimal SOIB Not on pressors      Objective      Review of Systems: Gen:  Denies  fever, sweats, chills weight loss  HEENT: Denies blurred vision, double vision, ear pain, eye pain, hearing loss, nose bleeds, sore throat Cardiac:  No dizziness, chest pain or heaviness, chest tightness,edema, No JVD Resp:   No cough, -sputum production, -shortness of breath,-wheezing, -hemoptysis,  Gi: Denies swallowing difficulty, stomach pain, nausea or vomiting, diarrhea, constipation, bowel incontinence Gu:  Denies bladder incontinence, burning urine Ext:   Denies Joint pain, stiffness or swelling Skin: Denies  skin rash, easy bruising or bleeding or hives Endoc:  Denies polyuria, polydipsia , polyphagia or weight change Psych:   Denies depression, insomnia or hallucinations  Other:  All other systems negative   Physical Examination:   General Appearance: No distress  EYES PERRLA, EOM intact.   NECK Supple, No JVD Pulmonary: normal breath sounds, No wheezing.  CardiovascularNormal S1,S2.  No m/r/g.   Abdomen: Benign, Soft, non-tender. Skin:   warm, no rashes, no ecchymosis  Extremities: normal, no cyanosis, clubbing. Neuro:without focal findings,  speech normal  PSYCHIATRIC: Mood, affect within normal limits.   ALL OTHER ROS ARE NEGATIVE   VITALS:  height is 5\' 7"  (1.702 m) and weight is 162.5 kg (abnormal). His oral temperature is 100.3 F (37.9 C). His blood pressure is 128/87 and his pulse is 93. His respiration is 28 (abnormal) and oxygen saturation is 90%.   I personally reviewed Labs under Results section.  Radiology Reports CT ABDOMEN PELVIS W CONTRAST  Result Date: 04/06/2022 CLINICAL DATA:  Abdominal pain, acute, nonlocalized. Fever and chills. EXAM: CT ABDOMEN AND PELVIS  WITH CONTRAST TECHNIQUE: Multidetector CT imaging of the abdomen and pelvis was performed using the standard protocol following bolus administration of intravenous contrast. RADIATION DOSE REDUCTION: This exam was performed according to the departmental dose-optimization program which includes automated exposure control, adjustment of the mA and/or kV according to patient size and/or use of iterative reconstruction technique. CONTRAST:  06/06/2022 OMNIPAQUE IOHEXOL 350 MG/ML SOLN COMPARISON:  02/23/2017 FINDINGS: Lower chest: Lung bases are clear. Hepatobiliary: Liver parenchyma is normal.  No calcified gallstones. Pancreas: Normal Spleen: Normal Adrenals/Urinary Tract: Adrenal glands are normal. Kidneys are normal. Bladder is normal. Stomach/Bowel: Stomach and small intestine are normal. There is diverticulosis of the colon without visible diverticulitis. Vascular/Lymphatic: IVC filters in place both above and below the level of the renal arteries. The aorta is normal. No retroperitoneal lymphadenopathy in the abdominal region. There chronic bilateral inguinal to iliac lymph nodes, unchanged since 2018 and therefore not likely malignant. Reproductive: Normal Other: No free fluid or air. Musculoskeletal: Periumbilical hernia containing only fat. Numerous abdominal wall varicosities, possibly related to venous return issues in the setting of the IVC filters. IMPRESSION: No acute abdominal inflammatory finding. Solid organs appear unremarkable. No acute bowel finding. Diverticulosis of the colon but without evidence of diverticulitis. IVC filters in place above and below the level of the renal veins. Question poor venous return, based on the presence of numerous venous collateral superficially. Umbilical hernia containing only fat. Electronically Signed   By: 2019 M.D.   On: 04/06/2022 19:01   CT Angio Chest PE W and/or Wo Contrast  Result Date: 04/06/2022 CLINICAL DATA:  Pulmonary embolism suspected, high  probability. Fever and chills. Right leg pain. EXAM: CT ANGIOGRAPHY CHEST WITH CONTRAST TECHNIQUE: Multidetector CT imaging of the chest was performed using the standard protocol during bolus administration of intravenous contrast. Multiplanar CT image reconstructions and MIPs were obtained to evaluate the vascular anatomy. RADIATION DOSE REDUCTION: This exam was performed according to the departmental dose-optimization program which includes automated exposure control, adjustment of the mA and/or kV according to patient size and/or use of iterative reconstruction technique. CONTRAST:  OMNIPAQUE IOHEXOL 350 MG/ML SOLN COMPARISON:  Radiography same day.  CT 02/20/2022. FINDINGS: Cardiovascular: Heart size is normal. No pericardial effusion. No visible coronary artery calcification or aortic atherosclerotic calcification. Pulmonary arterial opacification is moderate. I think there is pulmonary embolus in the left lower lobe to lingular branches. Prominent main pulmonary artery which could indicate pulmonary arterial hypertension, which is chronic. No sign of right heart strain acutely. Mediastinum/Nodes: No mass or lymphadenopathy. Lungs/Pleura: No pleural effusion. The lungs are clear. No infiltrate, collapse, mass or nodule. Upper Abdomen: See results of abdominal CT. Musculoskeletal: Ordinary degenerative changes of the spine. No rib fracture. Incidental varicosities of the anterior chest wall. Review of the MIP images confirms the above findings. IMPRESSION: Positive for pulmonary embolism in the left lower lobe to lingular branches. Pulmonary arterial opacification is only moderate and findings could be somewhat debatable. Electronically Signed   By: Paulina Fusi M.D.   On: 04/06/2022 18:56   DG Chest Port 1 View  Result Date: 04/06/2022 CLINICAL DATA:  Sepsis EXAM: PORTABLE CHEST 1 VIEW COMPARISON:  02/20/2022 FINDINGS: Single frontal view of the chest demonstrates an unremarkable cardiac silhouette. No  airspace disease, effusion, or pneumothorax. No acute bony abnormalities. IMPRESSION: 1. No acute intrathoracic process. Electronically Signed   By: Sharlet Salina M.D.   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  [facility]  Medical Director [facility] Medical Director [facility] Cardio-Pulmonary Department

## 2022-04-07 NOTE — Consult Note (Signed)
[facility] Vascular Consult Note  MRN : [id number]  Ryan [name] is a 51 y.o. (10-Jan-1971) male who presents with chief complaint of  Chief Complaint  Patient presents with   Fever  .  History of Present Illness: I am asked to see the patient by Webb SilversmithElizabeth Ouma to assess whether or not his IVC filter could be the source of his recurrent bacteremia.  The patient has a longstanding history of hypercoagulable state and has actually had 2 IVC filters placed many years ago.  These were likely in the range of 9 to 12 years ago.  He has had 4 episodes of bacteremia this year of unclear etiology.  There was concern that the filter could be the source of this.  [facility] him against any removal of the IVC filter due to his recurrent thromboembolic issues.  He actually still has PE with the filter in place.  He underwent a CT scan of the abdomen pelvis which I have independently reviewed.  His inferior IVC filter is in an occluded IVC at this location and this would clearly not be amenable to any attempt at retrieval.  His superior IVC filter is in the patent portion of the suprarenal IVC up and essentially the retrohepatic portion of the inferior vena cava.  It is patent at this location there does not appear to be any clot or complicating features on this superior IVC filter.  Current Facility-Administered Medications  Medication Dose Route Frequency Provider Last Rate Last Admin   ceFAZolin (ANCEF) IVPB 2g/100 mL premix  2 g Intravenous Q8H Erin FullingKasa, Kurian, MD       Chlorhexidine Gluconate Cloth 2 % PADS 6 each  6 each Topical [id number] Erin FullingKasa, Kurian, MD   6 each at 04/06/22 2231   docusate sodium (COLACE) capsule 100 mg  100 mg Oral BID PRN Jimmye Normanuma, Elizabeth Achieng, NP       heparin ADULT infusion 100 units/mL (25000 units/25550mL)  1,500 Units/hr Intravenous Continuous Otelia SergeantBelue, Nathan S, RPH 15 mL/hr at 04/07/22 0901 1,500  Units/hr at 04/07/22 0901   polyethylene glycol (MIRALAX / GLYCOLAX) packet 17 g  17 g Oral Daily PRN Jimmye Normanuma, Elizabeth Achieng, NP        Past Medical History:  Diagnosis Date   CHF (congestive heart failure) (HCC)    Diabetes mellitus without complication (HCC)    per pt-pre diabetic-Dr Dario GuardianJadali stated said pt   DVT (deep venous thrombosis) (HCC)    Hepatic steatosis    Hypertension    Morbid obesity with BMI of 50.0-59.9, adult (HCC)    Peripheral vascular disease (HCC)    Presence of IVC filter    Ventricular trigeminy    Weakness of both lower limbs     Past Surgical History:  Procedure Laterality Date   CARDIAC ELECTROPHYSIOLOGY STUDY AND ABLATION  10/2020   PERIPHERAL VASCULAR CATHETERIZATION N/A 01/10/2015   Procedure: Dialysis/Perma Catheter Insertion;  Surgeon: Renford DillsGregory G Schnier, MD;  Location: [facility] INVASIVE CV LAB;  Service: Cardiovascular;  Laterality: N/A;   PERIPHERAL VASCULAR CATHETERIZATION N/A 02/24/2015   Procedure: Dialysis/Perma Catheter Removal;  Surgeon: Annice NeedyJason S Sayvion Vigen, MD;  Location: [facility] INVASIVE CV LAB;  Service: Cardiovascular;  Laterality: N/A;     Social History   Tobacco Use   Smoking status: Never   Smokeless tobacco: Never  Vaping Use   Vaping Use: Never used  Substance Use Topics   Alcohol use: No  Drug use: No     Family History  Problem Relation Age of Onset   Hypertension Mother    Cancer Mother    Breast cancer Mother    Prostate cancer Father    Hypertension Father    Diabetes Father    Pulmonary embolism Paternal Grandfather    Pulmonary embolism Paternal Uncle     Allergies  Allergen Reactions   Metrizamide Other (See Comments)    Kidney failure requiring dialysis Kidney failure requiring dialysis    Clindamycin/Lincomycin Rash   Iodinated Contrast Media Other (See Comments)    H/o kidney failure so was told not to receive contrast. NOT ALLERGY.    Lincomycin Rash   Sulfa Antibiotics Rash     REVIEW OF SYSTEMS  (Negative unless checked)  Constitutional: [] Weight loss  [] Fever  [] Chills Cardiac: [] Chest pain   [] Chest pressure   [] Palpitations   [] Shortness of breath when laying flat   [] Shortness of breath at rest   [x] Shortness of breath with exertion. Vascular:  [] Pain in legs with walking   [] Pain in legs at rest   [] Pain in legs when laying flat   [] Claudication   [] Pain in feet when walking  [] Pain in feet at rest  [] Pain in feet when laying flat   [x] History of DVT   [x] Phlebitis   [x] Swelling in legs   [] Varicose veins   [x] Non-healing ulcers Pulmonary:   [] Uses home oxygen   [] Productive cough   [] Hemoptysis   [] Wheeze  [] COPD   [] Asthma Neurologic:  [] Dizziness  [] Blackouts   [] Seizures   [] History of stroke   [] History of TIA  [] Aphasia   [] Temporary blindness   [] Dysphagia   [] Weakness or numbness in arms   [] Weakness or numbness in legs Musculoskeletal:  [x] Arthritis   [] Joint swelling   [] Joint pain   [] Low back pain Hematologic:  [] Easy bruising  [] Easy bleeding   [x] Hypercoagulable state   [] Anemic  [] Hepatitis Gastrointestinal:  [] Blood in stool   [] Vomiting blood  [] Gastroesophageal reflux/heartburn   [] Difficulty swallowing. Genitourinary:  [] Chronic kidney disease   [] Difficult urination  [] Frequent urination  [] Burning with urination   [] Blood in urine Skin:  [] Rashes   [x] Ulcers   [x] Wounds Psychological:  [] History of anxiety   []  History of major depression.  Physical Examination  Vitals:   04/07/22 0330 04/07/22 0400 04/07/22 0500 04/07/22 0800  BP: (!) 149/88 128/87  131/82  Pulse: 96 93  88  Resp: (!) 28 (!) 28  (!) 23  Temp:  100.3 F (37.9 C)  99 F (37.2 C)  TempSrc:  Oral  Oral  SpO2: 93% 90%  95%  Weight:   (!) 162.5 kg   Height:       Body mass index is 56.11 kg/m. Gen:  WD/WN, NAD. Morbidly obese Head: [place]/AT, No temporalis wasting. Ear/Nose/Throat: Hearing grossly intact, nares w/o erythema or drainage, oropharynx w/o Erythema/Exudate Eyes: Sclera  non-icteric, conjunctiva clear Neck: Trachea midline.  No JVD.  Pulmonary:  Good air movement, respirations mildly labored and mildly tachypnic Cardiac: RRR, normal S1, S2. Vascular:  Vessel Right Left  Radial Palpable Palpable   Musculoskeletal: M/S 5/5 throughout.  Extremities without ischemic changes.  No deformity or atrophy. 2-3+ BLE edema. Neurologic: Sensation grossly intact in extremities.  Symmetrical.  Speech is fluent. Motor exam as listed above. Psychiatric: Judgment intact, Mood & affect appropriate for pt's clinical situation. Dermatologic: No rashes or ulcers noted.  No cellulitis or open wounds.  CBC Lab Results  Component Value Date   WBC 19.0 (H) 04/07/2022   HGB 14.9 04/07/2022   HCT 45.5 04/07/2022   MCV 92.1 04/07/2022   PLT 229 04/07/2022    BMET    Component Value Date/Time   NA 138 04/07/2022 0200   NA 138 03/18/2022 1438   NA 130 (L) 12/28/2014 1602   K 3.9 04/07/2022 0200   K 3.8 12/28/2014 1602   CL 110 04/07/2022 0200   CL 94 (L) 12/28/2014 1602   CO2 21 (L) 04/07/2022 0200   CO2 24 12/28/2014 1602   GLUCOSE 125 (H) 04/07/2022 0200   GLUCOSE 121 (H) 12/28/2014 1602   BUN 14 04/07/2022 0200   BUN 15 03/18/2022 1438   BUN 57 (H) 12/28/2014 1602   CREATININE 1.56 (H) 04/07/2022 0200   CREATININE 5.55 (H) 12/28/2014 1602   CALCIUM 8.4 (L) 04/07/2022 0200   CALCIUM 8.1 (L) 12/28/2014 1602   GFRNONAA 53 (L) 04/07/2022 0200   GFRNONAA 11 (L) 12/28/2014 1602   GFRAA 55 (L) 03/07/2020 2009   GFRAA 13 (L) 12/28/2014 1602   Estimated Creatinine Clearance: 82 mL/min (A) (by C-G formula based on SCr of 1.56 mg/dL (H)).  COAG Lab Results  Component Value Date   INR 1.5 (H) 04/06/2022   INR 1.4 (H) 04/06/2022   INR 1.5 (H) 02/20/2022    Radiology US Venous Img Lower Bilateral (DVT)  Result Date: 04/07/2022 CLINICAL DATA:  Bilateral lower extremity pain and edema. History prior bilateral chronic lower extremity DVT and pulmonary  embolism. EXAM: BILATERAL LOWER EXTREMITY VENOUS DOPPLER ULTRASOUND TECHNIQUE: Gray-scale sonography with graded compression, as well as color Doppler and duplex ultrasound were performed to evaluate the lower extremity deep venous systems from the level of the common femoral vein and including the common femoral, femoral, profunda femoral, popliteal and calf veins including the posterior tibial, peroneal and gastrocnemius veins when visible. The superficial great saphenous vein was also interrogated. Spectral Doppler was utilized to evaluate flow at rest and with distal augmentation maneuvers in the common femoral, femoral and popliteal veins. COMPARISON:  01/01/2022 FINDINGS: RIGHT LOWER EXTREMITY Common Femoral Vein: Stable nonocclusive chronic mural thrombus. Saphenofemoral Junction: Patent saphenofemoral junction. Profunda Femoral Vein: Stable nonocclusive chronic mural thrombus. Femoral Vein: Stable nonocclusive chronic mural thrombus. Popliteal Vein: Stable nonocclusive chronic mural thrombus. Calf Veins: Probable nonocclusive chronic thrombus in the posterior tibial vein. The peroneal vein is not visualized. Superficial Great Saphenous Vein: No evidence of thrombus. Normal compressibility. Venous Reflux:  None. Other Findings: No evidence of superficial thrombophlebitis or abnormal fluid collection. LEFT LOWER EXTREMITY Common Femoral Vein: Stable nonocclusive chronic mural thrombus. Saphenofemoral Junction: Patent. Profunda Femoral Vein: Stable nonocclusive chronic mural thrombus. Femoral Vein: Stable nonocclusive chronic mural thrombus. Popliteal Vein: Stable nonocclusive chronic mural thrombus and web formation. Calf Veins: Nonocclusive chronic thrombus in posterior tibial and peroneal veins. Superficial Great Saphenous Vein: No evidence of thrombus. Normal compressibility. Venous Reflux:  None. Other Findings: No evidence of superficial thrombophlebitis or abnormal fluid collection. IMPRESSION: No  evidence of acute DVT in either lower extremity. Similar findings as the prior study of chronic nonocclusive mural thrombus throughout much of the deep venous system in both lower extremities. Electronically Signed   By: Irish Lack M.D.   On: 04/07/2022 11:37   CT ABDOMEN PELVIS W CONTRAST  Result Date: 04/06/2022 CLINICAL DATA:  Abdominal pain, acute, nonlocalized. Fever and chills. EXAM: CT ABDOMEN AND PELVIS WITH CONTRAST TECHNIQUE: Multidetector CT imaging of the abdomen and  pelvis was performed using the standard protocol following bolus administration of intravenous contrast. RADIATION DOSE REDUCTION: This exam was performed according to the departmental dose-optimization program which includes automated exposure control, adjustment of the mA and/or kV according to patient size and/or use of iterative reconstruction technique. CONTRAST:  OMNIPAQUE IOHEXOL 350 MG/ML SOLN COMPARISON:  02/23/2017 FINDINGS: Lower chest: Lung bases are clear. Hepatobiliary: Liver parenchyma is normal.  No calcified gallstones. Pancreas: Normal Spleen: Normal Adrenals/Urinary Tract: Adrenal glands are normal. Kidneys are normal. Bladder is normal. Stomach/Bowel: Stomach and small intestine are normal. There is diverticulosis of the colon without visible diverticulitis. Vascular/Lymphatic: IVC filters in place both above and below the level of the renal arteries. The aorta is normal. No retroperitoneal lymphadenopathy in the abdominal region. There chronic bilateral inguinal to iliac lymph nodes, unchanged since 2018 and therefore not likely malignant. Reproductive: Normal Other: No free fluid or air. Musculoskeletal: Periumbilical hernia containing only fat. Numerous abdominal wall varicosities, possibly related to venous return issues in the setting of the IVC filters. IMPRESSION: No acute abdominal inflammatory finding. Solid organs appear unremarkable. No acute bowel finding. Diverticulosis of the colon but without  evidence of diverticulitis. IVC filters in place above and below the level of the renal veins. Question poor venous return, based on the presence of numerous venous collateral superficially. Umbilical hernia containing only fat. Electronically Signed   By: Paulina Fusi M.D.   On: 04/06/2022 19:01   CT Angio Chest PE W and/or Wo Contrast  Result Date: 04/06/2022 CLINICAL DATA:  Pulmonary embolism suspected, high probability. Fever and chills. Right leg pain. EXAM: CT ANGIOGRAPHY CHEST WITH CONTRAST TECHNIQUE: Multidetector CT imaging of the chest was performed using the standard protocol during bolus administration of intravenous contrast. Multiplanar CT image reconstructions and MIPs were obtained to evaluate the vascular anatomy. RADIATION DOSE REDUCTION: This exam was performed according to the departmental dose-optimization program which includes automated exposure control, adjustment of the mA and/or kV according to patient size and/or use of iterative reconstruction technique. CONTRAST:  OMNIPAQUE IOHEXOL 350 MG/ML SOLN COMPARISON:  Radiography same day.  CT 02/20/2022. FINDINGS: Cardiovascular: Heart size is normal. No pericardial effusion. No visible coronary artery calcification or aortic atherosclerotic calcification. Pulmonary arterial opacification is moderate. I think there is pulmonary embolus in the left lower lobe to lingular branches. Prominent main pulmonary artery which could indicate pulmonary arterial hypertension, which is chronic. No sign of right heart strain acutely. Mediastinum/Nodes: No mass or lymphadenopathy. Lungs/Pleura: No pleural effusion. The lungs are clear. No infiltrate, collapse, mass or nodule. Upper Abdomen: See results of abdominal CT. Musculoskeletal: Ordinary degenerative changes of the spine. No rib fracture. Incidental varicosities of the anterior chest wall. Review of the MIP images confirms the above findings. IMPRESSION: Positive for pulmonary embolism in the  left lower lobe to lingular branches. Pulmonary arterial opacification is only moderate and findings could be somewhat debatable. Electronically Signed   By: Paulina Fusi M.D.   On: 04/06/2022 18:56   DG Chest Port 1 View  Result Date: 04/06/2022 CLINICAL DATA:  Sepsis EXAM: PORTABLE CHEST 1 VIEW COMPARISON:  02/20/2022 FINDINGS: Single frontal view of the chest demonstrates an unremarkable cardiac silhouette. No airspace disease, effusion, or pneumothorax. No acute bony abnormalities. IMPRESSION: 1. No acute intrathoracic process. Electronically Signed   By: Sharlet Salina M.D.   On: 04/06/2022 17:29      Assessment/Plan 1.  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  Has had 2 previous IVC filter  was placed in the past.  He has discussed previously with his hematologist at [facility] who has recommended leaving the filter is in place.  His inferior IVC filter is an occluded part of the IVC and would not be able to be retrieved and would be at extensive open surgery with a very high morbidity and a low likelihood of any help.  His superior IVC filter has been in for many years without complications and the likelihood of it being infected would be very small, and he still has PE issues even with a filter in place.  I do not think removing his IVC filter at this time is likely of benefit and likely would put him at more risk.  I would agree with his hematologist at [facility].  I will continue anticoagulation and would look for other source of infection as IVC filter infections are very rare. 2.  Status post IVC filter placement x 2 in the past.  See above note.  Inferior filter is not amenable to retrieval and I would not recommend retrieval of the superior filter at this time 3.  Recurrent bacteremia.  Source unclear. 4.  Morbid obesity.  If consideration is given for an open retrieval of the inferior filter (which I would not recommend), this would be an extremely morbid procedure on a patient this  size.   Festus Barren, MD  04/07/2022 4:16 PM    This note was created with Dragon medical transcription system.  Any error is purely unintentional

## 2022-04-07 NOTE — Consult Note (Signed)
PHARMACY CONSULT NOTE  Pharmacy Consult for Electrolyte Monitoring and Replacement   Recent Labs: Potassium (mmol/L)  Date Value  04/07/2022 3.9  12/28/2014 3.8   Magnesium (mg/dL)  Date Value  84/69/6295 1.7  12/28/2014 2.7 (H)   Calcium (mg/dL)  Date Value  28/41/3244 8.4 (L)   Calcium, Total (mg/dL)  Date Value  09/01/7251 8.1 (L)   Albumin (g/dL)  Date Value  66/44/0347 4.1  03/18/2022 4.6  12/23/2014 3.9   Phosphorus (mg/dL)  Date Value  42/59/5638 2.7  12/28/2014 6.1 (H)   Sodium (mmol/L)  Date Value  04/07/2022 138  03/18/2022 138  12/28/2014 130 (L)   Assessment: Patient is a 51 y/o M with medical history including 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 There is concern for infection of IVC filter. Pharmacy consulted to assist with electrolyte monitoring and replacement.   Goal of Therapy:  Electrolytes within normal limits  Plan:  --Mg 1.7, magnesium sulfate 2 g IV x 1 --Follow-up electrolytes with AM labs tomorrow  Tressie Ellis 04/07/2022 10:59 AM

## 2022-04-07 NOTE — Consult Note (Signed)
ANTICOAGULATION CONSULT NOTE  Pharmacy Consult for IV Heparin Indication: pulmonary embolus  Patient Measurements: Height: 5\' 7"  (170.2 cm) Weight: (!) 162.5 kg (358 lb 4 oz) IBW/kg (Calculated) : 66.1 Heparin Dosing Weight: 106.9 kg  Labs: Recent Labs    04/06/22 1715 04/06/22 2223 04/07/22 0200 04/07/22 0926 04/07/22 1559  HGB 15.8 15.1 14.9  --   --   HCT 49.8 47.1 45.5  --   --   PLT 262 239 229  --   --   APTT 29 111*  --   --   --   LABPROT 16.9* 17.5*  --   --   --   INR 1.4* 1.5*  --   --   --   HEPARINUNFRC  --   --  1.06* 0.58 0.27*  CREATININE 1.65*  --  1.56*  --   --   TROPONINIHS  --   --  16  --   --      Estimated Creatinine Clearance: 82 mL/min (A) (by C-G formula based on SCr of 1.56 mg/dL (H)).   Medical History: Past Medical History:  Diagnosis Date   CHF (congestive heart failure) (HCC)    Diabetes mellitus without complication (HCC)    per pt-pre diabetic-Dr 06/07/22 stated said pt   DVT (deep venous thrombosis) (HCC)    Hepatic steatosis    Hypertension    Morbid obesity with BMI of 50.0-59.9, adult (HCC)    Peripheral vascular disease (HCC)    Presence of IVC filter    Ventricular trigeminy    Weakness of both lower limbs     Medications:  Xarelto 20 mg daily prior to admission Query compliance, fill records showing last filled 01/16/22 for a 30 day supply Heparin level not elevated at baseline as would be expected if taking Xarelto  Assessment: Patient is a 51 y/o M with medical history including 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 There is concern for infection of IVC filter. Pharmacy consulted to initiate and manage IV heparin for acute PE.  Baseline labs: aPTT 29 sec, INR 1.4, Hgb 15.8, Plts 262  Goal of Therapy:  Heparin  level 0.3-0.7 units/ml Monitor platelets by anticoagulation protocol: Yes   Plan:  --8/9@1559 : HL 0.27, subtherapeutic --Bolus 1600 units x 1 --Increase heparin infusion to 1700 units/hr --Re-check HL in 6 hours --Daily CBC per protocol while on IV heparin  10/8  04/07/2022 5:43 PM

## 2022-04-07 NOTE — Consult Note (Signed)
ANTICOAGULATION CONSULT NOTE  Pharmacy Consult for IV Heparin Indication: pulmonary embolus  Patient Measurements: Height: 5\' 7"  (170.2 cm) Weight: (!) 162.5 kg (358 lb 4 oz) IBW/kg (Calculated) : 66.1 Heparin Dosing Weight: 106.9 kg  Labs: Recent Labs    04/06/22 1715 04/06/22 2223 04/07/22 0200 04/07/22 0926  HGB 15.8 15.1 14.9  --   HCT 49.8 47.1 45.5  --   PLT 262 239 229  --   APTT 29 111*  --   --   LABPROT 16.9* 17.5*  --   --   INR 1.4* 1.5*  --   --   HEPARINUNFRC  --   --  1.06* 0.58  CREATININE 1.65*  --  1.56*  --   TROPONINIHS  --   --  16  --      Estimated Creatinine Clearance: 82 mL/min (A) (by C-G formula based on SCr of 1.56 mg/dL (H)).   Medical History: Past Medical History:  Diagnosis Date   CHF (congestive heart failure) (HCC)    Diabetes mellitus without complication (HCC)    per pt-pre diabetic-Dr 06/07/22 stated said pt   DVT (deep venous thrombosis) (HCC)    Hepatic steatosis    Hypertension    Morbid obesity with BMI of 50.0-59.9, adult (HCC)    Peripheral vascular disease (HCC)    Presence of IVC filter    Ventricular trigeminy    Weakness of both lower limbs     Medications:  Xarelto 20 mg daily prior to admission Query compliance, fill records showing last filled 01/16/22 for a 30 day supply Heparin level not elevated at baseline as would be expected if taking Xarelto  Assessment: Patient is a 51 y/o M with medical history including 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 There is concern for infection of IVC filter. Pharmacy consulted to initiate and manage IV heparin for acute PE.  Baseline labs: aPTT 29 sec, INR 1.4, Hgb 15.8, Plts 262  Goal of Therapy:  Heparin level 0.3-0.7 units/ml Monitor platelets by anticoagulation  protocol: Yes   Plan:  --Heparin level is therapeutic x 1 --Continue heparin infusion at 1500 units/hr --Re-check confirmatory HL in 6 hours --Daily CBC per protocol while on IV heparin  10/8  04/07/2022 11:08 AM

## 2022-04-07 NOTE — Consult Note (Signed)
ANTICOAGULATION CONSULT NOTE  Pharmacy Consult for Heparin Indication: pulmonary embolus  Allergies  Allergen Reactions   Metrizamide Other (See Comments)    Kidney failure requiring dialysis Kidney failure requiring dialysis    Clindamycin/Lincomycin Rash   Iodinated Contrast Media Other (See Comments)    H/o kidney failure so was told not to receive contrast. NOT ALLERGY.    Lincomycin Rash   Sulfa Antibiotics Rash    Patient Measurements: Height: 5\' 7"  (170.2 cm) Weight: (!) 162.5 kg (358 lb 4 oz) IBW/kg (Calculated) : 66.1 Heparin Dosing Weight: 106.9 kg  Vital Signs: Temp: 99.9 F (37.7 C) (08/08 2230) Temp Source: Oral (08/08 2230) BP: 136/90 (08/09 0100) Pulse Rate: 101 (08/09 0100)  Labs: Recent Labs    04/06/22 1715 04/06/22 2223 04/07/22 0200  HGB 15.8 15.1 14.9  HCT 49.8 47.1 45.5  PLT 262 239 229  APTT 29 111*  --   LABPROT 16.9* 17.5*  --   INR 1.4* 1.5*  --   HEPARINUNFRC  --   --  1.06*  CREATININE 1.65*  --  1.56*     Estimated Creatinine Clearance: 82 mL/min (A) (by C-G formula based on SCr of 1.56 mg/dL (H)).   Medical History: Past Medical History:  Diagnosis Date   CHF (congestive heart failure) (HCC)    Diabetes mellitus without complication (HCC)    per pt-pre diabetic-Dr 06/07/22 stated said pt   DVT (deep venous thrombosis) (HCC)    Hepatic steatosis    Hypertension    Morbid obesity with BMI of 50.0-59.9, adult (HCC)    Peripheral vascular disease (HCC)    Presence of IVC filter    Ventricular trigeminy    Weakness of both lower limbs     Medications:  No history of chronic AC use PTA  Assessment: Pharmacy has been consulted to initiate and monitor heparin infusion in 51yo male presenting to the ED with chills. CT angio of chest positive for PE.   Baseline labs: aPTT 29 sec, INR 1.4, Hgb 15.8, Plts 262  Goal of Therapy:  Heparin level 0.3-0.7 units/ml Monitor platelets by anticoagulation protocol: Yes  8/09 0200 HL  1.06, supratherapeutic   Plan:  Decrease heparin infusion to 1500 units/hr Recheck HL in 6 hours after rate change Continue to monitor H&H and platelets  10/09, PharmD, [facility] 04/07/2022 2:57 AM

## 2022-04-08 ENCOUNTER — Inpatient Hospital Stay (HOSPITAL_COMMUNITY)
Admit: 2022-04-08 | Discharge: 2022-04-08 | Disposition: A | Discharge status: Home / Self Care | Payer: No Typology Code available for payment source | Attending: Critical Care Medicine | Admitting: Critical Care Medicine

## 2022-04-08 DIAGNOSIS — I2609 Other pulmonary embolism with acute cor pulmonale: Secondary | ICD-10-CM | POA: Diagnosis not present

## 2022-04-08 DIAGNOSIS — A419 Sepsis, unspecified organism: Secondary | ICD-10-CM | POA: Diagnosis not present

## 2022-04-08 DIAGNOSIS — R6520 Severe sepsis without septic shock: Secondary | ICD-10-CM | POA: Diagnosis not present

## 2022-04-08 LAB — BASIC METABOLIC PANEL
Anion gap: 6 (ref 5–15)
BUN: 15 mg/dL (ref 6–20)
CO2: 23 mmol/L (ref 22–32)
Calcium: 8.4 mg/dL — ABNORMAL LOW (ref 8.9–10.3)
Chloride: 105 mmol/L (ref 98–111)
Creatinine, Ser: 1.39 mg/dL — ABNORMAL HIGH (ref 0.61–1.24)
GFR, Estimated: >60 mL/min (ref >60)
Glucose, Bld: 117 mg/dL — ABNORMAL HIGH (ref 70–99)
Potassium: 3.7 mmol/L (ref 3.5–5.1)
Sodium: 134 mmol/L — ABNORMAL LOW (ref 135–145)

## 2022-04-08 LAB — CBC
HCT: 43.5 % (ref 39.0–52.0)
Hemoglobin: 14.1 g/dL (ref 13.0–17.0)
MCH: 30 pg (ref 26.0–34.0)
MCHC: 32.4 g/dL (ref 30.0–36.0)
MCV: 92.6 fL (ref 80.0–100.0)
Platelets: 181 10*3/uL (ref 150–400)
RBC: 4.7 MIL/uL (ref 4.22–5.81)
RDW: 14.9 % (ref 11.5–15.5)
WBC: 6 10*3/uL (ref 4.0–10.5)
nRBC: 0 % (ref 0.0–0.2)

## 2022-04-08 LAB — HEPARIN LEVEL (UNFRACTIONATED)
Heparin Unfractionated: 0.23 IU/mL — ABNORMAL LOW (ref 0.30–0.70)
Heparin Unfractionated: 0.25 IU/mL — ABNORMAL LOW (ref 0.30–0.70)
Heparin Unfractionated: 0.26 IU/mL — ABNORMAL LOW (ref 0.30–0.70)
Heparin Unfractionated: 0.32 IU/mL (ref 0.30–0.70)

## 2022-04-08 LAB — ECHOCARDIOGRAM COMPLETE
AR max vel: 2.71 cm2
AV Area VTI: 3.14 cm2
AV Area mean vel: 3.01 cm2
AV Mean grad: 5 mmHg
AV Peak grad: 10.8 mmHg
Ao pk vel: 1.64 m/s
Area-P 1/2: 3.76 cm2
Height: 67 in
S' Lateral: 2.71 cm
Weight: 5731.96 oz

## 2022-04-08 LAB — LEGIONELLA PNEUMOPHILA SEROGP 1 UR AG: L. pneumophila Serogp 1 Ur Ag: NEGATIVE

## 2022-04-08 LAB — PHOSPHORUS: Phosphorus: 2.2 mg/dL — ABNORMAL LOW (ref 2.5–4.6)

## 2022-04-08 LAB — PROCALCITONIN: Procalcitonin: 2.16 ng/mL

## 2022-04-08 LAB — MAGNESIUM: Magnesium: 2.3 mg/dL (ref 1.7–2.4)

## 2022-04-08 MED ORDER — HEPARIN BOLUS VIA INFUSION
1500.0000 [IU] | Freq: Once | INTRAVENOUS | Status: AC
  Administered 2022-04-08: 1500 [IU] via INTRAVENOUS
  Filled 2022-04-08: qty 1500

## 2022-04-08 MED ORDER — LOSARTAN POTASSIUM 50 MG PO TABS
50.0000 mg | ORAL_TABLET | Freq: Every day | ORAL | Status: DC
  Administered 2022-04-08 – 2022-04-11 (×4): 50 mg via ORAL
  Filled 2022-04-08 (×4): qty 1

## 2022-04-08 MED ORDER — METOPROLOL SUCCINATE ER 25 MG PO TB24
25.0000 mg | ORAL_TABLET | Freq: Every day | ORAL | Status: DC
  Administered 2022-04-08 – 2022-04-11 (×4): 25 mg via ORAL
  Filled 2022-04-08 (×4): qty 1

## 2022-04-08 MED ORDER — ALBUTEROL SULFATE (2.5 MG/3ML) 0.083% IN NEBU
3.0000 mL | INHALATION_SOLUTION | Freq: Four times a day (QID) | RESPIRATORY_TRACT | Status: DC | PRN
Start: 2022-04-08 — End: 2022-04-11

## 2022-04-08 MED ORDER — ACETAMINOPHEN 500 MG PO TABS
1000.0000 mg | ORAL_TABLET | Freq: Four times a day (QID) | ORAL | Status: DC | PRN
  Administered 2022-04-09: 1000 mg via ORAL
  Filled 2022-04-08: qty 2

## 2022-04-08 MED ORDER — FUROSEMIDE 40 MG PO TABS
40.0000 mg | ORAL_TABLET | Freq: Every day | ORAL | Status: DC
  Administered 2022-04-08 – 2022-04-11 (×4): 40 mg via ORAL
  Filled 2022-04-08 (×2): qty 2
  Filled 2022-04-08 (×2): qty 1

## 2022-04-08 MED ORDER — MORPHINE SULFATE (PF) 2 MG/ML IV SOLN: 2.0000 mg | INTRAVENOUS | Status: DC | PRN

## 2022-04-08 MED ORDER — OXYCODONE HCL 5 MG PO TABS
5.0000 mg | ORAL_TABLET | ORAL | Status: DC | PRN
  Administered 2022-04-09 – 2022-04-10 (×2): 5 mg via ORAL
  Filled 2022-04-08 (×2): qty 1

## 2022-04-08 NOTE — Consult Note (Signed)
ANTICOAGULATION CONSULT NOTE  Pharmacy Consult for IV Heparin Indication: pulmonary embolus  Patient Measurements: Height: 5\' 7"  (170.2 cm) Weight: (!) 162.5 kg (358 lb 4 oz) IBW/kg (Calculated) : 66.1 Heparin Dosing Weight: 106.9 kg  Labs: Recent Labs    04/06/22 1715 04/06/22 2223 04/07/22 0200 04/07/22 0926 04/08/22 0609 04/08/22 1454 04/08/22 2141  HGB 15.8 15.1 14.9  --  14.1  --   --   HCT 49.8 47.1 45.5  --  43.5  --   --   PLT 262 239 229  --  181  --   --   APTT 29 111*  --   --   --   --   --   LABPROT 16.9* 17.5*  --   --   --   --   --   INR 1.4* 1.5*  --   --   --   --   --   HEPARINUNFRC  --   --  1.06*   < > 0.25* 0.23* 0.26*  CREATININE 1.65*  --  1.56*  --  1.39*  --   --   TROPONINIHS  --   --  16  --   --   --   --    < > = values in this interval not displayed.    Estimated Creatinine Clearance: 92.1 mL/min (A) (by C-G formula based on SCr of 1.39 mg/dL (H)).  Medical History: Past Medical History:  Diagnosis Date   CHF (congestive heart failure) (HCC)    Diabetes mellitus without complication (HCC)    per pt-pre diabetic-Dr 06/08/22 stated said pt   DVT (deep venous thrombosis) (HCC)    Hepatic steatosis    Hypertension    Morbid obesity with BMI of 50.0-59.9, adult (HCC)    Peripheral vascular disease (HCC)    Presence of IVC filter    Ventricular trigeminy    Weakness of both lower limbs     Medications:  Xarelto 20 mg daily prior to admission Query compliance, fill records showing last filled 01/16/22 for a 30 day supply Heparin level not elevated at baseline as would be expected if taking Xarelto  Assessment: Patient is a 51 y/o M with medical history including 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 There is concern for infection of  IVC filter. Pharmacy consulted to initiate and manage IV heparin for acute PE.  Baseline labs: aPTT 29 sec, INR 1.4, Hgb 15.8, Plts 262  Goal of Therapy:  Heparin level 0.3-0.7 units/ml Monitor platelets by anticoagulation protocol: Yes   Plan:  --Heparin level is subtherapeutic --Heparin 1500 unit IV bolus and increase heparin infusion to 2300 units/hr --Re-check HL w/ AM labs after rate change --Daily CBC per protocol while on IV heparin  10/8, PharmD, [facility] 04/08/2022 10:42 PM

## 2022-04-08 NOTE — Progress Notes (Addendum)
PROGRESS NOTE    Ryan [name]  [id number] DOB: 1971-08-06 DOA: 04/06/2022 PCP: Jon Billings, NP    Brief Narrative:   51y.o. male with medical history significant of HTN, pre-DM, with recurrent DVT/PE s/p IVC filter x2 (2005, 2012 on Xarelto, dCHF,, PVD, CKD stage IIIa, atrial fibrillation (s/p of ablation per pt, triggered by COVID infection,recurrent pneumonia, Cellulitis of right foot who presented to the ED with chief complaints of SOB, RLE pain, fevers and chills.  Seen in consultation by vascular surgery.  IVC filter x 2 felt not to be the source of infection or fever.  Adherence to prescribed Xarelto is in question.  Patient is currently on a heparin drip.  He is on 4 L nasal cannula.  Starting to feel better.  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  Adherence to prescribed anticoagulation regimen is in question.  Not a candidate for invasive strategy at this time.  Vascular surgery evaluated recommended conservative management with IV anticoagulation with plans to transition to oral anticoagulation within the next 1 to 2 days Plan: Transfer to medical telemetry Continue heparin GTT  Acute hypoxic respiratory failure Patient currently requiring 4 L nasal cannula.  Likely secondary to clot burden in setting of acute PE. Plan: Vitals per unit protocol Wean oxygen as tolerated  Recurrent fevers Severe sepsis, source unclear Patient has had admissions in the  past with very extensive workup without clear etiology of the fever.  Lactic acid elevated.  Procalcitonin elevated but unclear etiology.  No clear source of infection.  Vascular surgery does not feel that the IVC filter is the source.  Blood cultures thus far no growth to date.  Possible that clot burden and concomitant metabolic syndrome in the setting of morbid obesity could be contributing to these breakthrough fevers. Plan: Continue Ancef for now Monitor vitals and fever curve Monitor culture data  History of heart failure with preserved ejection fraction Restarted Lasix, losartan, metoprolol per home dose.    History of atrial fibrillation/flutter/post ablation in 2022 Home Xarelto on hold.  On heparin gtt. home metoprolol resumed.  Continue telemetry monitoring for now  AKI on CKD stage IIIa Creatinine approaching baseline Avoid nephrotoxins  Morbid obesity BMI 56 This complicates overall care and prognosis   DVT prophylaxis: Heparin GTT Code Status: Full Family Communication: None Disposition Plan: Status is: Inpatient Remains inpatient appropriate because: Acute respiratory failure, possible sepsis, acute PE   Level of care: Telemetry Medical  Consultants:  None  Procedures:  None  Antimicrobials: Cefazolin   Subjective: Seen and examined.  Mildly tachypneic but otherwise in no distress.  Answers all questions appropriately.  Speaks complete sentences.  Objective: Vitals:   04/08/22 1000 04/08/22 1200 04/08/22 1400 04/08/22 1451  BP: 136/86 (!) 140/89 (!) 146/88   Pulse: 77 89 87 86  Resp: 16 (!) 22 (!) 21 (!) 21  Temp:    100.1 F (37.8 C)  TempSrc:    Oral  SpO2: 93% 96% (!) 89% 93%  Weight:  Height:        Intake/Output Summary (Last 24 hours) at 04/08/2022 1506 Last data filed at 04/08/2022 1453 Gross per 24 hour  Intake 1640.44 ml  Output 3110 ml  Net -1469.56 ml   Filed Weights   04/06/22 1657 04/06/22 2230 04/07/22 0500  Weight: (!)  161.3 kg (!) 162.5 kg (!) 162.5 kg    Examination:  General exam: NAD Respiratory system: Tachypneic, clear lungs, normal work of breathing, 4 L Cardiovascular system: S1-S2, RRR, no murmurs, 1+ pitting edema BLE Gastrointestinal system: Obese, soft, NT/ND, normal bowel sounds Central nervous system: Alert and oriented. No focal neurological deficits. Extremities: Symmetric 5 x 5 power. Skin: No rashes, lesions or ulcers Psychiatry: Judgement and insight appear normal. Mood & affect appropriate.     Data Reviewed: I have personally reviewed following labs and imaging studies  CBC: Recent Labs  Lab 04/06/22 1715 04/06/22 2223 04/07/22 0200 04/08/22 0609  WBC 12.1* 18.7* 19.0* 6.0  NEUTROABS 10.7* 15.8*  --   --   HGB 15.8 15.1 14.9 14.1  HCT 49.8 47.1 45.5 43.5  MCV 94.7 93.8 92.1 92.6  PLT 262 239 229 181   Basic Metabolic Panel: Recent Labs  Lab 04/06/22 1715 04/07/22 0200 04/08/22 0609  NA 137 138 134*  K 4.3 3.9 3.7  CL 106 110 105  CO2 23 21* 23  GLUCOSE 129* 125* 117*  BUN 13 14 15   CREATININE 1.65* 1.56* 1.39*  CALCIUM 9.0 8.4* 8.4*  MG  --  1.7 2.3  PHOS  --  2.7 2.2*   GFR: Estimated Creatinine Clearance: 92.1 mL/min (A) (by C-G formula based on SCr of 1.39 mg/dL (H)). Liver Function Tests: Recent Labs  Lab 04/06/22 1715  AST 26  ALT 29  ALKPHOS 57  BILITOT 1.3*  PROT 8.4*  ALBUMIN 4.1   No results for input(s): "LIPASE", "AMYLASE" in the last 168 hours. No results for input(s): "AMMONIA" in the last 168 hours. Coagulation Profile: Recent Labs  Lab 04/06/22 1715 04/06/22 2223  INR 1.4* 1.5*   Cardiac Enzymes: No results for input(s): "CKTOTAL", "CKMB", "CKMBINDEX", "TROPONINI" in the last 168 hours. BNP (last 3 results) No results for input(s): "PROBNP" in the last 8760 hours. HbA1C: No results for input(s): "HGBA1C" in the last 72 hours. CBG: Recent Labs  Lab 04/06/22 2241  GLUCAP 116*   Lipid Profile: No results for  input(s): "CHOL", "HDL", "LDLCALC", "TRIG", "CHOLHDL", "LDLDIRECT" in the last 72 hours. Thyroid Function Tests: No results for input(s): "TSH", "T4TOTAL", "FREET4", "T3FREE", "THYROIDAB" in the last 72 hours. Anemia Panel: No results for input(s): "VITAMINB12", "FOLATE", "FERRITIN", "TIBC", "IRON", "RETICCTPCT" in the last 72 hours. Sepsis Labs: Recent Labs  Lab 04/06/22 1714 04/06/22 2223 04/07/22 0200 04/08/22 0609  PROCALCITON  --  2.27 2.79 2.16  LATICACIDVEN 2.3* 2.2*  --   --     Recent Results (from the past 240 hour(s))  Culture, blood (Routine x 2)     Status: None (Preliminary result)   Collection Time: 04/06/22  5:17 PM   Specimen: BLOOD  Result Value Ref Range Status   Specimen Description BLOOD RIGHT ANTECUBITAL  Final   Special Requests   Final    BOTTLES DRAWN AEROBIC AND ANAEROBIC Blood Culture results may not be optimal due to an excessive volume of blood received in culture bottles   Culture   Final    NO GROWTH 2 DAYS Performed at [facility], [address], [place], [place]  Report Status PENDING  Incomplete  Culture, blood (Routine x 2)     Status: None (Preliminary result)   Collection Time: 04/06/22  5:18 PM   Specimen: BLOOD RIGHT HAND  Result Value Ref Range Status   Specimen Description BLOOD RIGHT HAND  Final   Special Requests   Final    BOTTLES DRAWN AEROBIC AND ANAEROBIC Blood Culture adequate volume   Culture   Final    NO GROWTH 2 DAYS Performed at [facility], [address], [name], [place] [address]    Report Status PENDING  Incomplete  Resp Panel by RT-PCR (Flu A&B, Covid) Anterior Nasal Swab     Status: None   Collection Time: 04/06/22  5:52 PM   Specimen: Anterior Nasal Swab  Result Value Ref Range Status   SARS Coronavirus 2 by RT PCR NEGATIVE NEGATIVE Final    Comment: (NOTE) SARS-CoV-2 target nucleic acids are NOT DETECTED.  The SARS-CoV-2 RNA is generally detectable in upper  respiratory specimens during the acute phase of infection. The lowest concentration of SARS-CoV-2 viral copies this assay can detect is 138 copies/mL. A negative result does not preclude SARS-Cov-2 infection and should not be used as the sole basis for treatment or other patient management decisions. A negative result may occur with  improper specimen collection/handling, submission of specimen other than nasopharyngeal swab, presence of viral mutation(s) within the areas targeted by this assay, and inadequate number of viral copies(<138 copies/mL). A negative result must be combined with clinical observations, patient history, and epidemiological information. The expected result is Negative.  Fact Sheet for Patients:  EntrepreneurPulse.com.au  Fact Sheet for Healthcare Providers:  IncredibleEmployment.be  This test is no t yet approved or cleared by the [place] FDA and  has been authorized for detection and/or diagnosis of SARS-CoV-2 by FDA under an Emergency Use Authorization (EUA). This EUA will remain  in effect (meaning this test can be used) for the duration of the COVID-19 declaration under Section 564(b)(1) of the Act, 21 U.S.C.section 360bbb-3(b)(1), unless the authorization is terminated  or revoked sooner.       Influenza A by PCR NEGATIVE NEGATIVE Final   Influenza B by PCR NEGATIVE NEGATIVE Final    Comment: (NOTE) The Xpert Xpress SARS-CoV-2/FLU/RSV plus assay is intended as an aid in the diagnosis of influenza from Nasopharyngeal swab specimens and should not be used as a sole basis for treatment. Nasal washings and aspirates are unacceptable for Xpert Xpress SARS-CoV-2/FLU/RSV testing.  Fact Sheet for Patients: EntrepreneurPulse.com.au  Fact Sheet for Healthcare Providers: IncredibleEmployment.be  This test is not yet approved or cleared by the [place] FDA and has been  authorized for detection and/or diagnosis of SARS-CoV-2 by FDA under an Emergency Use Authorization (EUA). This EUA will remain in effect (meaning this test can be used) for the duration of the COVID-19 declaration under Section 564(b)(1) of the Act, 21 U.S.C. section 360bbb-3(b)(1), unless the authorization is terminated or revoked.  Performed at [facility], [address], [place], [place] [address]   Urine Culture     Status: None (Preliminary result)   Collection Time: 04/06/22  7:16 PM   Specimen: Urine, Random  Result Value Ref Range Status   Specimen Description   Final    URINE, RANDOM Performed at [facility], [address], [place], [place] [address]    Special Requests   Final    NONE Performed at [facility], [address], [place], [place] [address]  Culture   Final    CULTURE REINCUBATED FOR BETTER GROWTH Performed at [facility], [place] [address], [place], [place] [address]    Report Status PENDING  Incomplete  MRSA Next Gen by PCR, Nasal     Status: None   Collection Time: 04/06/22 10:30 PM   Specimen: Nasal Mucosa; Nasal Swab  Result Value Ref Range Status   MRSA by PCR Next Gen NOT DETECTED NOT DETECTED Final    Comment: (NOTE) The GeneXpert MRSA Assay (FDA approved for NASAL specimens only), is one component of a comprehensive MRSA colonization surveillance program. It is not intended to diagnose MRSA infection nor to guide or monitor treatment for MRSA infections. Test performance is not FDA approved in patients less than 59 years old. Performed at [facility], [address] [place], [place] [address]          Radiology Studies: US Venous Img Lower Bilateral (DVT)  Result Date: 04/07/2022 CLINICAL DATA:  Bilateral lower extremity pain and edema. History prior bilateral chronic lower extremity DVT and pulmonary embolism. EXAM: BILATERAL LOWER EXTREMITY VENOUS DOPPLER ULTRASOUND TECHNIQUE:  Gray-scale sonography with graded compression, as well as color Doppler and duplex ultrasound were performed to evaluate the lower extremity deep venous systems from the level of the common femoral vein and including the common femoral, femoral, profunda femoral, popliteal and calf veins including the posterior tibial, peroneal and gastrocnemius veins when visible. The superficial great saphenous vein was also interrogated. Spectral Doppler was utilized to evaluate flow at rest and with distal augmentation maneuvers in the common femoral, femoral and popliteal veins. COMPARISON:  01/01/2022 FINDINGS: RIGHT LOWER EXTREMITY Common Femoral Vein: Stable nonocclusive chronic mural thrombus. Saphenofemoral Junction: Patent saphenofemoral junction. Profunda Femoral Vein: Stable nonocclusive chronic mural thrombus. Femoral Vein: Stable nonocclusive chronic mural thrombus. Popliteal Vein: Stable nonocclusive chronic mural thrombus. Calf Veins: Probable nonocclusive chronic thrombus in the posterior tibial vein. The peroneal vein is not visualized. Superficial Great Saphenous Vein: No evidence of thrombus. Normal compressibility. Venous Reflux:  None. Other Findings: No evidence of superficial thrombophlebitis or abnormal fluid collection. LEFT LOWER EXTREMITY Common Femoral Vein: Stable nonocclusive chronic mural thrombus. Saphenofemoral Junction: Patent. Profunda Femoral Vein: Stable nonocclusive chronic mural thrombus. Femoral Vein: Stable nonocclusive chronic mural thrombus. Popliteal Vein: Stable nonocclusive chronic mural thrombus and web formation. Calf Veins: Nonocclusive chronic thrombus in posterior tibial and peroneal veins. Superficial Great Saphenous Vein: No evidence of thrombus. Normal compressibility. Venous Reflux:  None. Other Findings: No evidence of superficial thrombophlebitis or abnormal fluid collection. IMPRESSION: No evidence of acute DVT in either lower extremity. Similar findings as the prior study  of chronic nonocclusive mural thrombus throughout much of the deep venous system in both lower extremities. Electronically Signed   By: Aletta Edouard M.D.   On: 04/07/2022 11:37   CT ABDOMEN PELVIS W CONTRAST  Result Date: 04/06/2022 CLINICAL DATA:  Abdominal pain, acute, nonlocalized. Fever and chills. EXAM: CT ABDOMEN AND PELVIS WITH CONTRAST TECHNIQUE: Multidetector CT imaging of the abdomen and pelvis was performed using the standard protocol following bolus administration of intravenous contrast. RADIATION DOSE REDUCTION: This exam was performed according to the departmental dose-optimization program which includes automated exposure control, adjustment of the mA and/or kV according to patient size and/or use of iterative reconstruction technique. CONTRAST:  141mL OMNIPAQUE IOHEXOL 350 MG/ML SOLN COMPARISON:  02/23/2017 FINDINGS: Lower chest: Lung bases are clear. Hepatobiliary: Liver parenchyma is normal.  No calcified gallstones. Pancreas: Normal Spleen: Normal Adrenals/Urinary Tract: Adrenal  glands are normal. Kidneys are normal. Bladder is normal. Stomach/Bowel: Stomach and small intestine are normal. There is diverticulosis of the colon without visible diverticulitis. Vascular/Lymphatic: IVC filters in place both above and below the level of the renal arteries. The aorta is normal. No retroperitoneal lymphadenopathy in the abdominal region. There chronic bilateral inguinal to iliac lymph nodes, unchanged since 2018 and therefore not likely malignant. Reproductive: Normal Other: No free fluid or air. Musculoskeletal: Periumbilical hernia containing only fat. Numerous abdominal wall varicosities, possibly related to venous return issues in the setting of the IVC filters. IMPRESSION: No acute abdominal inflammatory finding. Solid organs appear unremarkable. No acute bowel finding. Diverticulosis of the colon but without evidence of diverticulitis. IVC filters in place above and below the level of the  renal veins. Question poor venous return, based on the presence of numerous venous collateral superficially. Umbilical hernia containing only fat. Electronically Signed   By: Paulina Fusi M.D.   On: 04/06/2022 19:01   CT Angio Chest PE W and/or Wo Contrast  Result Date: 04/06/2022 CLINICAL DATA:  Pulmonary embolism suspected, high probability. Fever and chills. Right leg pain. EXAM: CT ANGIOGRAPHY CHEST WITH CONTRAST TECHNIQUE: Multidetector CT imaging of the chest was performed using the standard protocol during bolus administration of intravenous contrast. Multiplanar CT image reconstructions and MIPs were obtained to evaluate the vascular anatomy. RADIATION DOSE REDUCTION: This exam was performed according to the departmental dose-optimization program which includes automated exposure control, adjustment of the mA and/or kV according to patient size and/or use of iterative reconstruction technique. CONTRAST:  OMNIPAQUE IOHEXOL 350 MG/ML SOLN COMPARISON:  Radiography same day.  CT 02/20/2022. FINDINGS: Cardiovascular: Heart size is normal. No pericardial effusion. No visible coronary artery calcification or aortic atherosclerotic calcification. Pulmonary arterial opacification is moderate. I think there is pulmonary embolus in the left lower lobe to lingular branches. Prominent main pulmonary artery which could indicate pulmonary arterial hypertension, which is chronic. No sign of right heart strain acutely. Mediastinum/Nodes: No mass or lymphadenopathy. Lungs/Pleura: No pleural effusion. The lungs are clear. No infiltrate, collapse, mass or nodule. Upper Abdomen: See results of abdominal CT. Musculoskeletal: Ordinary degenerative changes of the spine. No rib fracture. Incidental varicosities of the anterior chest wall. Review of the MIP images confirms the above findings. IMPRESSION: Positive for pulmonary embolism in the left lower lobe to lingular branches. Pulmonary arterial opacification is only  moderate and findings could be somewhat debatable. Electronically Signed   By: Paulina Fusi M.D.   On: 04/06/2022 18:56   DG Chest Port 1 View  Result Date: 04/06/2022 CLINICAL DATA:  Sepsis EXAM: PORTABLE CHEST 1 VIEW COMPARISON:  02/20/2022 FINDINGS: Single frontal view of the chest demonstrates an unremarkable cardiac silhouette. No airspace disease, effusion, or pneumothorax. No acute bony abnormalities. IMPRESSION: 1. No acute intrathoracic process. Electronically Signed   By: Sharlet Salina M.D.   On: 04/06/2022 17:29        Scheduled Meds:  Chlorhexidine Gluconate Cloth  6 each Topical Q0600   furosemide  40 mg Oral Daily   losartan  50 mg Oral Daily   metoprolol succinate  25 mg Oral Daily   Continuous Infusions:   ceFAZolin (ANCEF) IV Stopped (04/08/22 1436)   heparin 1,900 Units/hr (04/08/22 1405)     LOS: 2 days     Tresa Moore, MD [facility]   If 7PM-7AM, please contact night-coverage  04/08/2022, 3:06 PM

## 2022-04-08 NOTE — Progress Notes (Signed)
*  PRELIMINARY RESULTS* Echocardiogram 2D Echocardiogram has been performed.  Ryan [name] 04/08/2022, 8:59 AM

## 2022-04-08 NOTE — Plan of Care (Signed)
Complained of right lower leg soreness and swelling, with redness, and warmth. Reports he had not noticed this before today. Heparin infusing, dosing has been adjusted upwards per pharmacy x 2 today. Next heparin level scheduled for 10 pm. Also on Zosyn. This was discussed with Dr. Georgeann Oppenheim; no additional treatment ordered as pt is on anticoagulation and antibiotics. Pt informed he is receiving treatment for infection as well as blood clots. Pain meds offered but pt declined at this time. Remains on oxygen at 4L. Denies SOB, but does appear dyspneic with exertion. NSR on monitor today with occ PVC's. Void in urinal, eating well.  Problem: Fluid Volume: Goal: Hemodynamic stability will improve Outcome: Progressing   Problem: Clinical Measurements: Goal: Diagnostic test results will improve Outcome: Progressing Goal: Signs and symptoms of infection will decrease Outcome: Progressing   Problem: Respiratory: Goal: Ability to maintain adequate ventilation will improve Outcome: Progressing   Problem: Education: Goal: Knowledge of General Education information will improve Description: Including pain rating scale, medication(s)/side effects and non-pharmacologic comfort measures Outcome: Progressing   Problem: Health Behavior/Discharge Planning: Goal: Ability to manage health-related needs will improve Outcome: Progressing   Problem: Clinical Measurements: Goal: Ability to maintain clinical measurements within normal limits will improve Outcome: Progressing Goal: Will remain free from infection Outcome: Progressing Goal: Diagnostic test results will improve Outcome: Progressing Goal: Respiratory complications will improve Outcome: Progressing Goal: Cardiovascular complication will be avoided Outcome: Progressing   Problem: Activity: Goal: Risk for activity intolerance will decrease Outcome: Progressing   Problem: Nutrition: Goal: Adequate nutrition will be maintained Outcome:  Progressing   Problem: Coping: Goal: Level of anxiety will decrease Outcome: Progressing   Problem: Elimination: Goal: Will not experience complications related to bowel motility Outcome: Progressing Goal: Will not experience complications related to urinary retention Outcome: Progressing   Problem: Pain Managment: Goal: General experience of comfort will improve Outcome: Progressing   Problem: Safety: Goal: Ability to remain free from injury will improve Outcome: Progressing   Problem: Skin Integrity: Goal: Risk for impaired skin integrity will decrease Outcome: Progressing

## 2022-04-08 NOTE — Consult Note (Signed)
ANTICOAGULATION CONSULT NOTE  Pharmacy Consult for IV Heparin Indication: pulmonary embolus  Patient Measurements: Height: 5\' 7"  (170.2 cm) Weight: (!) 162.5 kg (358 lb 4 oz) IBW/kg (Calculated) : 66.1 Heparin Dosing Weight: 106.9 kg  Labs: Recent Labs    04/06/22 1715 04/06/22 2223 04/07/22 0200 04/07/22 0926 04/08/22 0007 04/08/22 0609 04/08/22 1454  HGB 15.8 15.1 14.9  --   --  14.1  --   HCT 49.8 47.1 45.5  --   --  43.5  --   PLT 262 239 229  --   --  181  --   APTT 29 111*  --   --   --   --   --   LABPROT 16.9* 17.5*  --   --   --   --   --   INR 1.4* 1.5*  --   --   --   --   --   HEPARINUNFRC  --   --  1.06*   < > 0.32 0.25* 0.23*  CREATININE 1.65*  --  1.56*  --   --  1.39*  --   TROPONINIHS  --   --  16  --   --   --   --    < > = values in this interval not displayed.    Estimated Creatinine Clearance: 92.1 mL/min (A) (by C-G formula based on SCr of 1.39 mg/dL (H)).  Medical History: Past Medical History:  Diagnosis Date   CHF (congestive heart failure) (HCC)    Diabetes mellitus without complication (HCC)    per pt-pre diabetic-Dr 06/08/22 stated said pt   DVT (deep venous thrombosis) (HCC)    Hepatic steatosis    Hypertension    Morbid obesity with BMI of 50.0-59.9, adult (HCC)    Peripheral vascular disease (HCC)    Presence of IVC filter    Ventricular trigeminy    Weakness of both lower limbs     Medications:  Xarelto 20 mg daily prior to admission Query compliance, fill records showing last filled 01/16/22 for a 30 day supply Heparin level not elevated at baseline as would be expected if taking Xarelto  Assessment: Patient is a 51 y/o M with medical history including 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 There is concern for infection of IVC  filter. Pharmacy consulted to initiate and manage IV heparin for acute PE.  Baseline labs: aPTT 29 sec, INR 1.4, Hgb 15.8, Plts 262  Goal of Therapy:  Heparin level 0.3-0.7 units/ml Monitor platelets by anticoagulation protocol: Yes   Plan:  --Heparin level is subtherapeutic --Heparin 1500 unit IV bolus and increase heparin infusion to 2100 units/hr --Re-check HL in 6 hours after rate change --Daily CBC per protocol while on IV heparin  10/8  04/08/2022 3:17 PM

## 2022-04-08 NOTE — Consult Note (Signed)
ANTICOAGULATION CONSULT NOTE  Pharmacy Consult for IV Heparin Indication: pulmonary embolus  Patient Measurements: Height: 5\' 7"  (170.2 cm) Weight: (!) 162.5 kg (358 lb 4 oz) IBW/kg (Calculated) : 66.1 Heparin Dosing Weight: 106.9 kg  Labs: Recent Labs    04/06/22 1715 04/06/22 2223 04/07/22 0200 04/07/22 0926 04/07/22 1559 04/08/22 0007 04/08/22 0609  HGB 15.8 15.1 14.9  --   --   --  14.1  HCT 49.8 47.1 45.5  --   --   --  43.5  PLT 262 239 229  --   --   --  181  APTT 29 111*  --   --   --   --   --   LABPROT 16.9* 17.5*  --   --   --   --   --   INR 1.4* 1.5*  --   --   --   --   --   HEPARINUNFRC  --   --  1.06*   < > 0.27* 0.32 0.25*  CREATININE 1.65*  --  1.56*  --   --   --  1.39*  TROPONINIHS  --   --  16  --   --   --   --    < > = values in this interval not displayed.    Estimated Creatinine Clearance: 92.1 mL/min (A) (by C-G formula based on SCr of 1.39 mg/dL (H)).  Medical History: Past Medical History:  Diagnosis Date   CHF (congestive heart failure) (HCC)    Diabetes mellitus without complication (HCC)    per pt-pre diabetic-Dr 06/08/22 stated said pt   DVT (deep venous thrombosis) (HCC)    Hepatic steatosis    Hypertension    Morbid obesity with BMI of 50.0-59.9, adult (HCC)    Peripheral vascular disease (HCC)    Presence of IVC filter    Ventricular trigeminy    Weakness of both lower limbs     Medications:  Xarelto 20 mg daily prior to admission Query compliance, fill records showing last filled 01/16/22 for a 30 day supply Heparin level not elevated at baseline as would be expected if taking Xarelto  Assessment: Patient is a 51 y/o M with medical history including 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 There is concern for infection of IVC  filter. Pharmacy consulted to initiate and manage IV heparin for acute PE.  Baseline labs: aPTT 29 sec, INR 1.4, Hgb 15.8, Plts 262  Goal of Therapy:  Heparin level 0.3-0.7 units/ml Monitor platelets by anticoagulation protocol: Yes   Plan:  --Heparin level is subtherapeutic --Heparin 1500 unit IV bolus and increase heparin infusion to 1900 units/hr --Re-check HL in 6 hours after rate change --Daily CBC per protocol while on IV heparin  10/8  04/08/2022 7:52 AM

## 2022-04-08 NOTE — Consult Note (Signed)
ANTICOAGULATION CONSULT NOTE  Pharmacy Consult for IV Heparin Indication: pulmonary embolus  Patient Measurements: Height: 5\' 7"  (170.2 cm) Weight: (!) 162.5 kg (358 lb 4 oz) IBW/kg (Calculated) : 66.1 Heparin Dosing Weight: 106.9 kg  Labs: Recent Labs    04/06/22 1715 04/06/22 2223 04/07/22 0200 04/07/22 0200 04/07/22 0926 04/07/22 1559 04/08/22 0007  HGB 15.8 15.1 14.9  --   --   --   --   HCT 49.8 47.1 45.5  --   --   --   --   PLT 262 239 229  --   --   --   --   APTT 29 111*  --   --   --   --   --   LABPROT 16.9* 17.5*  --   --   --   --   --   INR 1.4* 1.5*  --   --   --   --   --   HEPARINUNFRC  --   --  1.06*   < > 0.58 0.27* 0.32  CREATININE 1.65*  --  1.56*  --   --   --   --   TROPONINIHS  --   --  16  --   --   --   --    < > = values in this interval not displayed.     Estimated Creatinine Clearance: 82 mL/min (A) (by C-G formula based on SCr of 1.56 mg/dL (H)).   Medical History: Past Medical History:  Diagnosis Date   CHF (congestive heart failure) (HCC)    Diabetes mellitus without complication (HCC)    per pt-pre diabetic-Dr 06/08/22 stated said pt   DVT (deep venous thrombosis) (HCC)    Hepatic steatosis    Hypertension    Morbid obesity with BMI of 50.0-59.9, adult (HCC)    Peripheral vascular disease (HCC)    Presence of IVC filter    Ventricular trigeminy    Weakness of both lower limbs     Medications:  Xarelto 20 mg daily prior to admission Query compliance, fill records showing last filled 01/16/22 for a 30 day supply Heparin level not elevated at baseline as would be expected if taking Xarelto  Assessment: Patient is a 51 y/o M with medical history including 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 There is concern for infection of  IVC filter. Pharmacy consulted to initiate and manage IV heparin for acute PE.  Baseline labs: aPTT 29 sec, INR 1.4, Hgb 15.8, Plts 262  Goal of Therapy:  Heparin level 0.3-0.7 units/ml Monitor platelets by anticoagulation protocol: Yes   Plan:  --8/10 @ 0007: HL 0.32, subtherapeutic --Continue heparin infusion at 1700 units/hr --Recheck HL w/ AM labs to confirm --Daily CBC per protocol while on IV heparin  10/8, PharmD, [facility] 04/08/2022 1:07 AM

## 2022-04-09 DIAGNOSIS — I8290 Acute embolism and thrombosis of unspecified vein: Secondary | ICD-10-CM

## 2022-04-09 DIAGNOSIS — J9601 Acute respiratory failure with hypoxia: Secondary | ICD-10-CM | POA: Diagnosis not present

## 2022-04-09 DIAGNOSIS — D689 Coagulation defect, unspecified: Secondary | ICD-10-CM | POA: Diagnosis not present

## 2022-04-09 LAB — BASIC METABOLIC PANEL
Anion gap: 6 (ref 5–15)
BUN: 16 mg/dL (ref 6–20)
CO2: 25 mmol/L (ref 22–32)
Calcium: 8.4 mg/dL — ABNORMAL LOW (ref 8.9–10.3)
Chloride: 105 mmol/L (ref 98–111)
Creatinine, Ser: 1.29 mg/dL — ABNORMAL HIGH (ref 0.61–1.24)
GFR, Estimated: >60 mL/min (ref >60)
Glucose, Bld: 125 mg/dL — ABNORMAL HIGH (ref 70–99)
Potassium: 3.3 mmol/L — ABNORMAL LOW (ref 3.5–5.1)
Sodium: 136 mmol/L (ref 135–145)

## 2022-04-09 LAB — HEPARIN LEVEL (UNFRACTIONATED)
Heparin Unfractionated: 0.34 IU/mL (ref 0.30–0.70)
Heparin Unfractionated: 0.38 IU/mL (ref 0.30–0.70)

## 2022-04-09 LAB — CBC
HCT: 43.5 % (ref 39.0–52.0)
Hemoglobin: 14 g/dL (ref 13.0–17.0)
MCH: 29.3 pg (ref 26.0–34.0)
MCHC: 32.2 g/dL (ref 30.0–36.0)
MCV: 91 fL (ref 80.0–100.0)
Platelets: 215 10*3/uL (ref 150–400)
RBC: 4.78 MIL/uL (ref 4.22–5.81)
RDW: 14.7 % (ref 11.5–15.5)
WBC: 4.3 10*3/uL (ref 4.0–10.5)
nRBC: 0 % (ref 0.0–0.2)

## 2022-04-09 LAB — URINE CULTURE: Culture: 10000 — AB

## 2022-04-09 MED ORDER — SODIUM CHLORIDE 0.9 % IV SOLN: INTRAVENOUS | Status: DC | PRN

## 2022-04-09 MED ORDER — RIVAROXABAN 15 MG PO TABS
15.0000 mg | ORAL_TABLET | Freq: Two times a day (BID) | ORAL | Status: DC
  Administered 2022-04-10 – 2022-04-11 (×3): 15 mg via ORAL
  Filled 2022-04-09 (×4): qty 1

## 2022-04-09 MED ORDER — CEPHALEXIN 500 MG PO CAPS
500.0000 mg | ORAL_CAPSULE | Freq: Four times a day (QID) | ORAL | Status: DC
  Administered 2022-04-10 – 2022-04-11 (×6): 500 mg via ORAL
  Filled 2022-04-09 (×6): qty 1

## 2022-04-09 MED ORDER — RIVAROXABAN 20 MG PO TABS: 20.0000 mg | ORAL_TABLET | Freq: Every day | ORAL | Status: DC

## 2022-04-09 MED ORDER — POTASSIUM CHLORIDE CRYS ER 20 MEQ PO TBCR
40.0000 meq | EXTENDED_RELEASE_TABLET | Freq: Once | ORAL | Status: AC
Start: 2022-04-09 — End: 2022-04-09
  Administered 2022-04-09: 40 meq via ORAL
  Filled 2022-04-09: qty 2

## 2022-04-09 NOTE — Progress Notes (Signed)
PROGRESS NOTE    TEGEN LANDAU  [id number] DOB: 1970/10/29 DOA: 04/06/2022 PCP: Jon Billings, NP    Brief Narrative:   51y.o. male with medical history significant of HTN, pre-DM, with recurrent DVT/PE s/p IVC filter x2 (2005, 2012 on Xarelto, dCHF,, PVD, CKD stage IIIa, atrial fibrillation (s/p of ablation per pt, triggered by COVID infection,recurrent pneumonia, Cellulitis of right foot who presented to the ED with chief complaints of SOB, RLE pain, fevers and chills.  Seen in consultation by vascular surgery.  IVC filter x 2 felt not to be the source of infection or fever.  Adherence to prescribed Xarelto is in question.  Patient is currently on a heparin drip.  He is on 4 L nasal cannula.  Starting to feel better.  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  Adherence to prescribed anticoagulation regimen is in question.  Not a candidate for invasive strategy at this time.  Vascular surgery evaluated recommended conservative management with IV anticoagulation with plans to transition to oral anticoagulation within the next 1 to 2 days Plan: Continue heparin GTT for today with plans to transition to p.o. Xarelto starting 8/12  Acute hypoxic respiratory failure Patient currently requiring 4 L nasal cannula.  Likely secondary to clot burden in setting of acute PE.  Does not have a home oxygen requirement Plan: Vitals per unit protocol Wean oxygen as tolerated Ambulate,  physical therapy occupational therapy  Recurrent fevers Severe sepsis, source unclear Patient has had admissions in the past with very extensive workup without clear etiology of the fever.  Lactic acid elevated.  Procalcitonin elevated but unclear etiology.  No clear source of infection.  Vascular surgery does not feel that the IVC filter is the source.  Blood cultures thus far no growth to date.  Possible that clot burden and concomitant metabolic syndrome in the setting of morbid obesity could be contributing to these breakthrough fevers. Plan: Continue Ancef for now Monitor vitals and fever curve Monitor culture data Likely transition to p.o. antibiotics within the next 1 to 2 days  History of heart failure with preserved ejection fraction Restarted Lasix, losartan, metoprolol per home dose.    History of atrial fibrillation/flutter/post ablation in 2022 Home Xarelto on hold.  On heparin gtt. home metoprolol resumed.  Continue telemetry monitoring for now  AKI on CKD stage IIIa Creatinine approaching baseline Avoid nephrotoxins  Morbid obesity BMI 56 This complicates overall care and prognosis   DVT prophylaxis: Heparin GTT Code Status: Full Family Communication: None.  Offered to call but patient declined Disposition Plan: Status is: Inpatient Remains inpatient appropriate because: Acute respiratory failure, possible sepsis, acute PE   Level of care: Telemetry Medical  Consultants:  None  Procedures:  None  Antimicrobials: Cefazolin   Subjective: Seen and examined.  Remains on 4 L per tachypnea appears improved  Objective: Vitals:   04/09/22 0800 04/09/22 1000 04/09/22 1158 04/09/22 1335  BP: 133/88 128/74  130/86  Pulse: 64 70  79  Resp: 16 17  15  Temp:   98.1 F (36.7 C)   TempSrc:   Oral   SpO2: 96% 97%  95%  Weight:      Height:        Intake/Output Summary (Last 24 hours) at 04/09/2022 1400 Last data filed at 04/09/2022 1334 Gross per 24 hour   Intake 1832.72 ml  Output 2785 ml  Net -952.28 ml   Filed Weights   04/06/22 2230 04/07/22 0500 04/09/22 0500  Weight: (!) 162.5 kg (!) 162.5 kg (!) 165 kg    Examination:  General exam: No acute distress Respiratory system: Lungs clear.  Normal work of breathing.  Mildly tachypneic.  4 L Cardiovascular system: S1-S2, RRR, no murmurs, 1+ pitting edema BLE Gastrointestinal system: Obese, soft, NT/ND, normal bowel sounds Central nervous system: Alert and oriented. No focal neurological deficits. Extremities: Right lower extremity slightly swollen, tender to touch Skin: No rashes, lesions or ulcers Psychiatry: Judgement and insight appear normal. Mood & affect appropriate.     Data Reviewed: I have personally reviewed following labs and imaging studies  CBC: Recent Labs  Lab 04/06/22 1715 04/06/22 2223 04/07/22 0200 04/08/22 0609 04/09/22 0423  WBC 12.1* 18.7* 19.0* 6.0 4.3  NEUTROABS 10.7* 15.8*  --   --   --   HGB 15.8 15.1 14.9 14.1 14.0  HCT 49.8 47.1 45.5 43.5 43.5  MCV 94.7 93.8 92.1 92.6 91.0  PLT 262 239 229 181 [id number]   Basic Metabolic Panel: Recent Labs  Lab 04/06/22 1715 04/07/22 0200 04/08/22 0609 04/09/22 0423  NA 137 138 134* 136  K 4.3 3.9 3.7 3.3*  CL 106 110 105 105  CO2 23 21* 23 25  GLUCOSE 129* 125* 117* 125*  BUN 13 14 15 16   CREATININE 1.65* 1.56* 1.39* 1.29*  CALCIUM 9.0 8.4* 8.4* 8.4*  MG  --  1.7 2.3  --   PHOS  --  2.7 2.2*  --    GFR: Estimated Creatinine Clearance: 100.1 mL/min (A) (by C-G formula based on SCr of 1.29 mg/dL (H)). Liver Function Tests: Recent Labs  Lab 04/06/22 1715  AST 26  ALT 29  ALKPHOS 57  BILITOT 1.3*  PROT 8.4*  ALBUMIN 4.1   No results for input(s): "LIPASE", "AMYLASE" in the last 168 hours. No results for input(s): "AMMONIA" in the last 168 hours. Coagulation Profile: Recent Labs  Lab 04/06/22 1715 04/06/22 2223  INR 1.4* 1.5*   Cardiac Enzymes: No results for input(s): "CKTOTAL", "CKMB",  "CKMBINDEX", "TROPONINI" in the last 168 hours. BNP (last 3 results) No results for input(s): "PROBNP" in the last 8760 hours. HbA1C: No results for input(s): "HGBA1C" in the last 72 hours. CBG: Recent Labs  Lab 04/06/22 2241  GLUCAP 116*   Lipid Profile: No results for input(s): "CHOL", "HDL", "LDLCALC", "TRIG", "CHOLHDL", "LDLDIRECT" in the last 72 hours. Thyroid Function Tests: No results for input(s): "TSH", "T4TOTAL", "FREET4", "T3FREE", "THYROIDAB" in the last 72 hours. Anemia Panel: No results for input(s): "VITAMINB12", "FOLATE", "FERRITIN", "TIBC", "IRON", "RETICCTPCT" in the last 72 hours. Sepsis Labs: Recent Labs  Lab 04/06/22 1714 04/06/22 2223 04/07/22 0200 04/08/22 0609  PROCALCITON  --  2.27 2.79 2.16  LATICACIDVEN 2.3* 2.2*  --   --     Recent Results (from the past 240 hour(s))  Culture, blood (Routine x 2)     Status: None (Preliminary result)   Collection Time: 04/06/22  5:17 PM   Specimen: BLOOD  Result Value Ref Range Status   Specimen Description BLOOD RIGHT  ANTECUBITAL  Final   Special Requests   Final    BOTTLES DRAWN AEROBIC AND ANAEROBIC Blood Culture results may not be optimal due to an excessive volume of blood received in culture bottles   Culture   Final    NO GROWTH 3 DAYS Performed at [facility], [address], [place], [place] [address]    Report Status PENDING  Incomplete  Culture, blood (Routine x 2)     Status: None (Preliminary result)   Collection Time: 04/06/22  5:18 PM   Specimen: BLOOD RIGHT HAND  Result Value Ref Range Status   Specimen Description BLOOD RIGHT HAND  Final   Special Requests   Final    BOTTLES DRAWN AEROBIC AND ANAEROBIC Blood Culture adequate volume   Culture   Final    NO GROWTH 3 DAYS Performed at [facility], [address], [place], [place] [address]    Report Status PENDING  Incomplete  Resp Panel by RT-PCR (Flu A&B, Covid) Anterior Nasal Swab     Status: None   Collection  Time: 04/06/22  5:52 PM   Specimen: Anterior Nasal Swab  Result Value Ref Range Status   SARS Coronavirus 2 by RT PCR NEGATIVE NEGATIVE Final    Comment: (NOTE) SARS-CoV-2 target nucleic acids are NOT DETECTED.  The SARS-CoV-2 RNA is generally detectable in upper respiratory specimens during the acute phase of infection. The lowest concentration of SARS-CoV-2 viral copies this assay can detect is 138 copies/mL. A negative result does not preclude SARS-Cov-2 infection and should not be used as the sole basis for treatment or other patient management decisions. A negative result may occur with  improper specimen collection/handling, submission of specimen other than nasopharyngeal swab, presence of viral mutation(s) within the areas targeted by this assay, and inadequate number of viral copies(<138 copies/mL). A negative result must be combined with clinical observations, patient history, and epidemiological information. The expected result is Negative.  Fact Sheet for Patients:  BloggerCourse.com  Fact Sheet for Healthcare Providers:  SeriousBroker.it  This test is no t yet approved or cleared by the [place] FDA and  has been authorized for detection and/or diagnosis of SARS-CoV-2 by FDA under an Emergency Use Authorization (EUA). This EUA will remain  in effect (meaning this test can be used) for the duration of the COVID-19 declaration under Section 564(b)(1) of the Act, 21 U.S.C.section 360bbb-3(b)(1), unless the authorization is terminated  or revoked sooner.       Influenza A by PCR NEGATIVE NEGATIVE Final   Influenza B by PCR NEGATIVE NEGATIVE Final    Comment: (NOTE) The Xpert Xpress SARS-CoV-2/FLU/RSV plus assay is intended as an aid in the diagnosis of influenza from Nasopharyngeal swab specimens and should not be used as a sole basis for treatment. Nasal washings and aspirates are unacceptable for Xpert Xpress  SARS-CoV-2/FLU/RSV testing.  Fact Sheet for Patients: BloggerCourse.com  Fact Sheet for Healthcare Providers: SeriousBroker.it  This test is not yet approved or cleared by the [place] FDA and has been authorized for detection and/or diagnosis of SARS-CoV-2 by FDA under an Emergency Use Authorization (EUA). This EUA will remain in effect (meaning this test can be used) for the duration of the COVID-19 declaration under Section 564(b)(1) of the Act, 21 U.S.C. section 360bbb-3(b)(1), unless the authorization is terminated or revoked.  Performed at [facility], [address], [place], [place] [address]   Urine Culture     Status: Abnormal   Collection Time: 04/06/22  7:16 PM   Specimen: Urine, Random  Result Value Ref Range Status   Specimen Description   Final    URINE, RANDOM Performed at [facility], [address], [place], [place] [address]    Special Requests   Final    NONE Performed at [facility], [address] [place], [place] [address]    Culture (A)  Final    10,000 COLONIES/mL DIPHTHEROIDS(CORYNEBACTERIUM SPECIES) Standardized susceptibility testing for this organism is not available. Performed at [facility], [address] [place], [place] [address]    Report Status 04/09/2022 FINAL  Final  MRSA Next Gen by PCR, Nasal     Status: None   Collection Time: 04/06/22 10:30 PM   Specimen: Nasal Mucosa; Nasal Swab  Result Value Ref Range Status   MRSA by PCR Next Gen NOT DETECTED NOT DETECTED Final    Comment: (NOTE) The GeneXpert MRSA Assay (FDA approved for NASAL specimens only), is one component of a comprehensive MRSA colonization surveillance program. It is not intended to diagnose MRSA infection nor to guide or monitor treatment for MRSA infections. Test performance is not FDA approved in patients less than 24 years old. Performed at [facility], [address], [place], [place] [address]          Radiology Studies: ECHOCARDIOGRAM COMPLETE  Result Date: 04/08/2022    ECHOCARDIOGRAM REPORT   Patient Name:   Ryan [name] Date of Exam: 04/08/2022 Medical Rec #:  [id number]     Height:       67.0 in Accession #:    [id number]    Weight:       358.2 lb Date of Birth:  September 29, 1970     BSA:          2.591 m Patient Age:    51 years      BP:           144/96 mmHg Patient Gender: M             HR:           79 bpm. Exam Location:  [facility] Procedure: 2D Echo, Color Doppler and Cardiac Doppler Indications:     I26.09 Pulmonary Embolus  History:         Patient has prior history of Echocardiogram examinations, most                  recent 02/21/2022. CHF, PVD; Risk Factors:Hypertension and                  Diabetes.  Sonographer:     Humphrey Rolls Referring Phys:  [id number] Ezequiel Essex Diagnosing Phys: Lorine Bears MD  Sonographer Comments: No subcostal window. Image acquisition challenging due to patient body habitus. IMPRESSIONS  1. Left ventricular ejection fraction, by estimation, is 55 to 60%. The left ventricle has normal function. The left ventricle has no regional wall motion abnormalities. Left ventricular diastolic parameters were normal.  2. Right ventricular systolic function is mildly reduced. The right ventricular size is mildly enlarged.  3. The mitral valve is normal in structure. No evidence of mitral valve regurgitation. No evidence of mitral stenosis.  4. The aortic valve is normal in structure. Aortic valve regurgitation is not visualized. No aortic stenosis is present. FINDINGS  Left Ventricle: Left ventricular ejection fraction, by estimation, is 55 to 60%. The left ventricle has normal function. The left ventricle has no regional wall motion abnormalities. The left ventricular  internal cavity size was normal in size. There is  no left ventricular hypertrophy. Left ventricular diastolic parameters were normal. Right Ventricle: The right  ventricular size is mildly enlarged. No increase in right ventricular wall thickness. Right ventricular systolic function is mildly reduced. Left Atrium: Left atrial size was normal in size. Right Atrium: Right atrial size was normal in size. Pericardium: There is no evidence of pericardial effusion. Mitral Valve: The mitral valve is normal in structure. No evidence of mitral valve regurgitation. No evidence of mitral valve stenosis. Tricuspid Valve: The tricuspid valve is normal in structure. Tricuspid valve regurgitation is not demonstrated. No evidence of tricuspid stenosis. Aortic Valve: The aortic valve is normal in structure. Aortic valve regurgitation is not visualized. No aortic stenosis is present. Aortic valve mean gradient measures 5.0 mmHg. Aortic valve peak gradient measures 10.8 mmHg. Aortic valve area, by VTI measures 3.14 cm. Pulmonic Valve: The pulmonic valve was normal in structure. Pulmonic valve regurgitation is trivial. No evidence of pulmonic stenosis. Aorta: The aortic root is normal in size and structure. Venous: The inferior vena cava was not well visualized. IAS/Shunts: No atrial level shunt detected by color flow Doppler.  LEFT VENTRICLE PLAX 2D LVIDd:         3.86 cm   Diastology LVIDs:         2.71 cm   LV e' medial:    7.83 cm/s LV PW:         1.07 cm   LV E/e' medial:  8.7 LV IVS:        1.01 cm   LV e' lateral:   13.60 cm/s LVOT diam:     2.30 cm   LV E/e' lateral: 5.0 LV SV:         80 LV SV Index:   31 LVOT Area:     4.15 cm  RIGHT VENTRICLE RV Basal diam:  3.78 cm RV Mid diam:    4.44 cm RV S prime:     14.40 cm/s TAPSE (M-mode): 2.8 cm LEFT ATRIUM             Index        RIGHT ATRIUM           Index LA diam:        3.80 cm 1.47 cm/m   RA Area:     21.80 cm LA Vol (A2C):   35.6 ml 13.74 ml/m  RA Volume:   56.70 ml  21.89 ml/m LA Vol (A4C):   32.2 ml 12.43 ml/m LA Biplane Vol: 36.2 ml 13.97 ml/m  AORTIC VALVE                     PULMONIC VALVE AV Area (Vmax):    2.71 cm       PV Vmax:       0.61 m/s AV Area (Vmean):   3.01 cm      PV Peak grad:  1.5 mmHg AV Area (VTI):     3.14 cm AV Vmax:           164.00 cm/s AV Vmean:          103.000 cm/s AV VTI:            0.255 m AV Peak Grad:      10.8 mmHg AV Mean Grad:      5.0 mmHg LVOT Vmax:         107.00 cm/s LVOT Vmean:  74.700 cm/s LVOT VTI:          0.193 m LVOT/AV VTI ratio: 0.76  AORTA Ao Root diam: 3.50 cm MITRAL VALVE MV Area (PHT): 3.76 cm    SHUNTS MV Decel Time: 202 msec    Systemic VTI:  0.19 m MV E velocity: 67.87 cm/s  Systemic Diam: 2.30 cm MV A velocity: 63.60 cm/s MV E/A ratio:  1.07 Lorine Bears MD Electronically signed by Lorine Bears MD Signature Date/Time: 04/08/2022/5:44:07 PM    Final         Scheduled Meds:  [START ON 04/10/2022] cephALEXin  500 mg Oral Q6H   Chlorhexidine Gluconate Cloth  6 each Topical Q0600   furosemide  40 mg Oral Daily   losartan  50 mg Oral Daily   metoprolol succinate  25 mg Oral Daily   [START ON 04/10/2022] Rivaroxaban  15 mg Oral BID WC   Followed by   Melene Muller ON 05/01/2022] rivaroxaban  20 mg Oral Q supper   Continuous Infusions:  sodium chloride 10 mL/hr at 04/09/22 1333    ceFAZolin (ANCEF) IV 2 g (04/09/22 1334)   heparin 2,300 Units/hr (04/09/22 1300)     LOS: 3 days     Tresa Moore, MD [facility]   If 7PM-7AM, please contact night-coverage  04/09/2022, 2:00 PM

## 2022-04-09 NOTE — Consult Note (Signed)
ANTICOAGULATION CONSULT NOTE  Pharmacy Consult for IV Heparin Indication: pulmonary embolus  Patient Measurements: Height: 5\' 7"  (170.2 cm) Weight: (!) 165 kg (363 lb 12.1 oz) IBW/kg (Calculated) : 66.1 Heparin Dosing Weight: 106.9 kg  Labs: Recent Labs    04/06/22 1715 04/06/22 2223 04/07/22 0200 04/07/22 0926 04/08/22 0609 04/08/22 1454 04/08/22 2141 04/09/22 0423 04/09/22 1106  HGB 15.8 15.1 14.9  --  14.1  --   --  14.0  --   HCT 49.8 47.1 45.5  --  43.5  --   --  43.5  --   PLT 262 239 229  --  181  --   --  215  --   APTT 29 111*  --   --   --   --   --   --   --   LABPROT 16.9* 17.5*  --   --   --   --   --   --   --   INR 1.4* 1.5*  --   --   --   --   --   --   --   HEPARINUNFRC  --   --  1.06*   < > 0.25*   < > 0.26* 0.34 0.38  CREATININE 1.65*  --  1.56*  --  1.39*  --   --  1.29*  --   TROPONINIHS  --   --  16  --   --   --   --   --   --    < > = values in this interval not displayed.    Estimated Creatinine Clearance: 100.1 mL/min (A) (by C-G formula based on SCr of 1.29 mg/dL (H)).  Medical History: Past Medical History:  Diagnosis Date   CHF (congestive heart failure) (HCC)    Diabetes mellitus without complication (HCC)    per pt-pre diabetic-Dr 06/09/22 stated said pt   DVT (deep venous thrombosis) (HCC)    Hepatic steatosis    Hypertension    Morbid obesity with BMI of 50.0-59.9, adult (HCC)    Peripheral vascular disease (HCC)    Presence of IVC filter    Ventricular trigeminy    Weakness of both lower limbs     Medications:  Xarelto 20 mg daily prior to admission Query compliance, fill records showing last filled 01/16/22 for a 30 day supply Heparin level not elevated at baseline as would be expected if taking Xarelto  Assessment: Patient is a 51 y/o M with medical history including 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 There is concern for infection of IVC filter. Pharmacy consulted to initiate and manage IV heparin for acute PE.  Baseline labs: aPTT 29 sec, INR 1.4, Hgb 15.8, Plts 262  Goal of Therapy:  Heparin level 0.3-0.7 units/ml Monitor platelets by anticoagulation protocol: Yes   Plan:  --Heparin level is therapeutic x 2 --Continue heparin infusion at 2300 units/hr --Re-check HL tomorrow AM --Daily CBC per protocol while on IV heparin --Follow-up transition to oral anticoagulation  10/8  04/09/2022 12:01 PM

## 2022-04-09 NOTE — Progress Notes (Signed)
Rep[ort called to Marisue Ivan and patient transported to room 156 via bed

## 2022-04-09 NOTE — Evaluation (Addendum)
Occupational Therapy Evaluation Patient Details Name: Ryan [name] MRN: [id number] DOB: December 17, 1970 Today's Date: 04/09/2022   History of Present Illness Ryan [name] is a 51y.o. male with medical history significant of HTN, pre-DM, with recurrent DVT/PE s/p IVC filter x2 (2005, 2012 on Xarelto, dCHF,, PVD, CKD stage IIIa, atrial fibrillation (s/p of ablation per pt, triggered by COVID infection,recurrent pneumonia, Cellulitis of right foot who presented to the ED with chief complaints of SOB, RLE pain, fevers and chills.   Clinical Impression   Patient seen for OT evaluation this date. Patient presenting with generalized weakness, decreased activity tolerance, and impaired balance. At baseline, pt is independent in ADLs, IADLs, and functional mobility without an AD. Patient currently requiring supervision with 1 person hand held assist for functional mobility. Pt is close to baseline level of function with ADLs (anticipate supervision to Min guard), however, OT to follow pt while in this hospital to prevent further decline in function and work on energy conversation strategies. Patient will benefit from acute OT to increase overall independence in the areas of ADLs and functional mobility in order to safely discharge home.      Recommendations for follow up therapy are one component of a multi-disciplinary discharge planning process, led by the attending physician.  Recommendations may be updated based on patient status, additional functional criteria and insurance authorization.   Follow Up Recommendations  No OT follow up    Assistance Recommended at Discharge PRN  Patient can return home with the following A little help with walking and/or transfers    Functional Status Assessment  Patient has had a recent decline in their functional status and demonstrates the ability to make significant improvements in function in a reasonable and predictable amount of time.  Equipment Recommendations   None recommended by OT           Precautions / Restrictions Precautions Precautions: Fall Restrictions Weight Bearing Restrictions: No      Mobility Bed Mobility                 Patient Response: Cooperative  Transfers Overall transfer level: Needs assistance Equipment used: None Transfers: Sit to/from Stand Sit to Stand: Supervision           General transfer comment: Pt completed STS from recliner with supervision and no AD.      Balance Overall balance assessment: Needs assistance Sitting-balance support: Feet supported Sitting balance-Leahy Scale: Good     Standing balance support: Single extremity supported, During functional activity Standing balance-Leahy Scale: Fair                             ADL either performed or assessed with clinical judgement   ADL Overall ADL's : Needs assistance/impaired                          Toilet Transfer: Radiographer, therapeutic Details (indicate cue type and reason): simulated with STS from recliner, no AD         Functional mobility during ADLs: Supervision/safety (Pt completed functional mobility in the room with 1 person hand held assist, pt furniture walking at times grabbing onto items in room) General ADL Comments: anticipate supervision to Min guard for ADLs     Vision Patient Visual Report: No change from baseline Additional Comments: wears glasses for reading only  Pertinent Vitals/Pain Pain Assessment Pain Assessment: No/denies pain          Extremity/Trunk Assessment Upper Extremity Assessment Upper Extremity Assessment: Generalized weakness   Lower Extremity Assessment Lower Extremity Assessment: Defer to PT evaluation   Cervical / Trunk Assessment Cervical / Trunk Assessment: Normal   Communication Communication Communication: No difficulties   Cognition Arousal/Alertness: Awake/alert Behavior During Therapy: WFL for tasks  assessed/performed Overall Cognitive Status: Within Functional Limits for tasks assessed                                       General Comments  Pt reported dizziness throughout session, however, VSS. Pt on 4L O2 intially with 98% SpO2, then placed on RA with 95% SpO2 at rest. SpO2 remained above 90% with activity on RA.               Home Living Family/patient expects to be discharged to:: Private residence Living Arrangements: Children (100 y/o daughter (homeschooled)) Available Help at Discharge: Family;Available PRN/intermittently Type of Home: House Home Access: Level entry     Home Layout: Two level Alternate Level Stairs-Number of Steps: Bed/bathroom on main level; 12 steps to office (pt works from home) Alternate Level Stairs-Rails: Left Bathroom Shower/Tub: Producer, television/film/video: Standard Bathroom Accessibility: Yes   Home Equipment: Agricultural consultant (2 wheels);Wheelchair - Manufacturing systems engineer - built in          Prior Functioning/Environment Prior Level of Function : Independent/Modified Independent             Mobility Comments: Pt reports IND with functional mobility, no AD. ADLs Comments: Pt reports IND with ADLs and IADLs. Pt still drives, works, exercises throughout the week.        OT Problem List: Decreased strength;Obesity;Decreased activity tolerance;Impaired balance (sitting and/or standing)      OT Treatment/Interventions: Self-care/ADL training;Energy conservation;Therapeutic activities;Balance training;Patient/family education;Therapeutic exercise    OT Goals(Current goals can be found in the care plan section) Acute Rehab OT Goals Patient Stated Goal: return home OT Goal Formulation: With patient Time For Goal Achievement: 04/23/22 Potential to Achieve Goals: Good ADL Goals Pt Will Perform Lower Body Dressing: Independently;sitting/lateral leans Pt Will Transfer to Toilet: Independently;regular height toilet Pt  Will Perform Toileting - Clothing Manipulation and hygiene: Independently;sit to/from stand Additional ADL Goal #1: Pt will provide teach back for 2-3 Energy conservation techniques  OT Frequency: Min 2X/week                  AM-PAC OT "6 Clicks" Daily Activity     Outcome Measure Help from another person eating meals?: None Help from another person taking care of personal grooming?: None Help from another person toileting, which includes using toliet, bedpan, or urinal?: A Little Help from another person bathing (including washing, rinsing, drying)?: A Little Help from another person to put on and taking off regular upper body clothing?: None Help from another person to put on and taking off regular lower body clothing?: A Little 6 Click Score: 21   End of Session Equipment Utilized During Treatment: Gait belt Nurse Communication: Mobility status;Other (comment) (left on RA)  Activity Tolerance: Patient tolerated treatment well Patient left: in chair;with call bell/phone within reach  OT Visit Diagnosis: Unsteadiness on feet (R26.81);Muscle weakness (generalized) (M62.81)                Time: 7741-2878 OT Time Calculation (  min): 14 min Charges:  OT General Charges $OT Visit: 1 Visit OT Evaluation $OT Eval Low Complexity: 1 Low  [facility] MS, OTR/L ascom [phone]  04/09/22, 12:20 PM

## 2022-04-09 NOTE — Evaluation (Signed)
Physical Therapy Evaluation Patient Details Name: Ryan [name] MRN: [id number] DOB: 08-02-1971 Today's Date: 04/09/2022  History of Present Illness  Pt is a 51 y/o M admitted on 04/06/22 after presenting to the ED with c/o SOB, RLE pain, fevers & chills. Pt is being treated for acute PE, recurrent VTE, acute hypoxic respiratory failure, & severe sepsis of unclear source. PMH: HTN,  pre-DM, recurrent DVT/PE s/p IVC filter x 2 (2005, 2012) on xarelto, dCHF, PVD, CKD 3A, a-fib (s/p ablation, triggered by COVID infection), recurrent PNA, R foot cellulitis  Clinical Impression  Pt seen for PT evaluation with pt agreeable to tx. Pt reports prior to admission he was residing with his 27 y/o daughter in a 2 level home with level entry & full flight with L rail to access office on 2nd level. Pt reports he was independent without AD & denies falls. On this date, pt is mod I for bed mobility, supervision for STS & step pivot, and supervision for take a few steps along EOB multiple times. Overall mobility is limited by pt feeling "lightheaded" despite BP WNL. Anticipate pt can mobilize increased distances when feeling better.  BP checked in L wrist: Sitting EOB: 143/91 mmHg MAP 105 Sitting in recliner: 134/98 mmHg MAP 106 Sitting in recliner at end of session: 132/87 mmHg MAP 100       Recommendations for follow up therapy are one component of a multi-disciplinary discharge planning process, led by the attending physician.  Recommendations may be updated based on patient status, additional functional criteria and insurance authorization.  Follow Up Recommendations Home health PT      Assistance Recommended at Discharge PRN  Patient can return home with the following  A little help with walking and/or transfers;A little help with bathing/dressing/bathroom;Assistance with cooking/housework    Equipment Recommendations None recommended by PT  Recommendations for Other Services       Functional Status  Assessment Patient has had a recent decline in their functional status and demonstrates the ability to make significant improvements in function in a reasonable and predictable amount of time.     Precautions / Restrictions Precautions Precautions: Fall Restrictions Weight Bearing Restrictions: No      Mobility  Bed Mobility Overal bed mobility: Modified Independent             General bed mobility comments: supine>sit with HOB elevated, bed rails    Transfers Overall transfer level: Needs assistance Equipment used: None Transfers: Sit to/from Stand, Bed to chair/wheelchair/BSC Sit to Stand: Supervision   Step pivot transfers: Supervision            Ambulation/Gait Ambulation/Gait assistance: Supervision Gait Distance (Feet):  (3 ft forwards + backwards x 3 times) Assistive device: None Gait Pattern/deviations: Decreased step length - right, Decreased step length - left, Decreased stride length Gait velocity: decreased        Stairs            Wheelchair Mobility    Modified Rankin (Stroke Patients Only)       Balance Overall balance assessment: Needs assistance Sitting-balance support: Feet supported Sitting balance-Leahy Scale: Good     Standing balance support: During functional activity, No upper extremity supported Standing balance-Leahy Scale: Good                               Pertinent Vitals/Pain Pain Assessment Pain Assessment: No/denies pain    Home Living Family/patient expects to  be discharged to:: Private residence Living Arrangements: Children (72 y/o daughter) Available Help at Discharge: Family Type of Home: House Home Access: Level entry     Alternate Level Stairs-Number of Steps: office upstairs Home Layout: Two level;Able to live on main level with bedroom/bathroom Home Equipment: Rolling Walker (2 wheels);Wheelchair - Manufacturing systems engineer - built in      Prior Function Prior Level of Function :  Independent/Modified Independent             Mobility Comments: Pt reports he was independent without AD, driving, working (from home), tries to ambulate 1.5 miles/day. ADLs Comments: Pt reports IND with ADLs and IADLs. Pt still drives, works, exercises throughout the week.     Hand Dominance        Extremity/Trunk Assessment   Upper Extremity Assessment Upper Extremity Assessment: Overall WFL for tasks assessed    Lower Extremity Assessment Lower Extremity Assessment: Generalized weakness    Cervical / Trunk Assessment Cervical / Trunk Assessment: Normal  Communication   Communication: No difficulties  Cognition Arousal/Alertness: Awake/alert Behavior During Therapy: WFL for tasks assessed/performed Overall Cognitive Status: Within Functional Limits for tasks assessed                                          General Comments General comments (skin integrity, edema, etc.): Pt on 4L/min via nasal cannula with SpO2 >90%    Exercises     Assessment/Plan    PT Assessment Patient needs continued PT services  PT Problem List Decreased strength;Cardiopulmonary status limiting activity;Decreased activity tolerance;Decreased balance;Decreased mobility;Decreased knowledge of use of DME       PT Treatment Interventions DME instruction;Therapeutic exercise;Gait training;Balance training;Stair training;Neuromuscular re-education;Therapeutic activities;Patient/family education;Functional mobility training    PT Goals (Current goals can be found in the Care Plan section)  Acute Rehab PT Goals Patient Stated Goal: get better PT Goal Formulation: With patient Time For Goal Achievement: 04/23/22 Potential to Achieve Goals: Good    Frequency Min 2X/week     Co-evaluation               AM-PAC PT "6 Clicks" Mobility  Outcome Measure Help needed turning from your back to your side while in a flat bed without using bedrails?: None Help needed moving  from lying on your back to sitting on the side of a flat bed without using bedrails?: A Little Help needed moving to and from a bed to a chair (including a wheelchair)?: A Little Help needed standing up from a chair using your arms (e.g., wheelchair or bedside chair)?: A Little Help needed to walk in hospital room?: A Little Help needed climbing 3-5 steps with a railing? : A Little 6 Click Score: 19    End of Session Equipment Utilized During Treatment: Oxygen Activity Tolerance: Patient tolerated treatment well (limited by feeling lightheaded) Patient left: in chair;with call bell/phone within reach Nurse Communication: Mobility status PT Visit Diagnosis: Unsteadiness on feet (R26.81);Muscle weakness (generalized) (M62.81)    Time: 7262-0355 PT Time Calculation (min) (ACUTE ONLY): 17 min   Charges:   PT Evaluation $PT Eval Moderate Complexity: 1 Mod          Aleda Grana, PT, DPT 04/09/22, 12:43 PM   Sandi Mariscal 04/09/2022, 12:41 PM

## 2022-04-09 NOTE — Consult Note (Signed)
ANTICOAGULATION CONSULT NOTE  Pharmacy Consult for IV Heparin Indication: pulmonary embolus  Patient Measurements: Height: 5\' 7"  (170.2 cm) Weight: (!) 165 kg (363 lb 12.1 oz) IBW/kg (Calculated) : 66.1 Heparin Dosing Weight: 106.9 kg  Labs: Recent Labs    04/06/22 1715 04/06/22 2223 04/07/22 0200 04/07/22 0926 04/08/22 0609 04/08/22 1454 04/08/22 2141 04/09/22 0423  HGB 15.8 15.1 14.9  --  14.1  --   --  14.0  HCT 49.8 47.1 45.5  --  43.5  --   --  43.5  PLT 262 239 229  --  181  --   --  215  APTT 29 111*  --   --   --   --   --   --   LABPROT 16.9* 17.5*  --   --   --   --   --   --   INR 1.4* 1.5*  --   --   --   --   --   --   HEPARINUNFRC  --   --  1.06*   < > 0.25* 0.23* 0.26* 0.34  CREATININE 1.65*  --  1.56*  --  1.39*  --   --   --   TROPONINIHS  --   --  16  --   --   --   --   --    < > = values in this interval not displayed.    Estimated Creatinine Clearance: 92.9 mL/min (A) (by C-G formula based on SCr of 1.39 mg/dL (H)).  Medical History: Past Medical History:  Diagnosis Date   CHF (congestive heart failure) (HCC)    Diabetes mellitus without complication (HCC)    per pt-pre diabetic-Dr 06/09/22 stated said pt   DVT (deep venous thrombosis) (HCC)    Hepatic steatosis    Hypertension    Morbid obesity with BMI of 50.0-59.9, adult (HCC)    Peripheral vascular disease (HCC)    Presence of IVC filter    Ventricular trigeminy    Weakness of both lower limbs     Medications:  Xarelto 20 mg daily prior to admission Query compliance, fill records showing last filled 01/16/22 for a 30 day supply Heparin level not elevated at baseline as would be expected if taking Xarelto  Assessment: Patient is a 51 y/o M with medical history including 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 There is concern for infection of IVC filter. Pharmacy consulted to initiate and manage IV heparin for acute PE.  Baseline labs: aPTT 29 sec, INR 1.4, Hgb 15.8, Plts 262  Goal of Therapy:  Heparin level 0.3-0.7 units/ml Monitor platelets by anticoagulation protocol: Yes   Plan:  --Heparin level is therapeutic x 1 --Continue heparin infusion at 2300 units/hr --Re-check HL in 6 hrs to confirm --Daily CBC per protocol while on IV heparin  10/8, PharmD, [facility] 04/09/2022 5:34 AM

## 2022-04-10 ENCOUNTER — Inpatient Hospital Stay: Payer: No Typology Code available for payment source

## 2022-04-10 DIAGNOSIS — R6520 Severe sepsis without septic shock: Secondary | ICD-10-CM | POA: Diagnosis not present

## 2022-04-10 DIAGNOSIS — D689 Coagulation defect, unspecified: Secondary | ICD-10-CM | POA: Diagnosis not present

## 2022-04-10 DIAGNOSIS — J9601 Acute respiratory failure with hypoxia: Secondary | ICD-10-CM | POA: Diagnosis not present

## 2022-04-10 DIAGNOSIS — A419 Sepsis, unspecified organism: Secondary | ICD-10-CM | POA: Diagnosis not present

## 2022-04-10 LAB — CBC
HCT: 44 % (ref 39.0–52.0)
Hemoglobin: 14.1 g/dL (ref 13.0–17.0)
MCH: 29.3 pg (ref 26.0–34.0)
MCHC: 32 g/dL (ref 30.0–36.0)
MCV: 91.5 fL (ref 80.0–100.0)
Platelets: 263 10*3/uL (ref 150–400)
RBC: 4.81 MIL/uL (ref 4.22–5.81)
RDW: 14.6 % (ref 11.5–15.5)
WBC: 5.1 10*3/uL (ref 4.0–10.5)
nRBC: 0 % (ref 0.0–0.2)

## 2022-04-10 LAB — HEPARIN LEVEL (UNFRACTIONATED): Heparin Unfractionated: 0.2 IU/mL — ABNORMAL LOW (ref 0.30–0.70)

## 2022-04-10 MED ORDER — BENZONATATE 100 MG PO CAPS
200.0000 mg | ORAL_CAPSULE | Freq: Three times a day (TID) | ORAL | Status: DC
  Administered 2022-04-10 – 2022-04-11 (×4): 200 mg via ORAL
  Filled 2022-04-10 (×4): qty 2

## 2022-04-10 MED ORDER — GUAIFENESIN ER 600 MG PO TB12
600.0000 mg | ORAL_TABLET | Freq: Two times a day (BID) | ORAL | Status: DC
  Administered 2022-04-10 – 2022-04-11 (×3): 600 mg via ORAL
  Filled 2022-04-10 (×3): qty 1

## 2022-04-10 NOTE — Progress Notes (Signed)
PROGRESS NOTE    Ryan [name]  [id number] DOB: 1971-02-12 DOA: 04/06/2022 PCP: Jon Billings, NP    Brief Narrative:   51y.o. male with medical history significant of HTN, pre-DM, with recurrent DVT/PE s/p IVC filter x2 (2005, 2012 on Xarelto, dCHF,, PVD, CKD stage IIIa, atrial fibrillation (s/p of ablation per pt, triggered by COVID infection,recurrent pneumonia, Cellulitis of right foot who presented to the ED with chief complaints of SOB, RLE pain, fevers and chills.  Seen in consultation by vascular surgery.  IVC filter x 2 felt not to be the source of infection or fever.  Adherence to prescribed Xarelto is in question.  Patient is currently on a heparin drip.  He is on 4 L nasal cannula.  Starting to feel better.  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  Adherence to prescribed anticoagulation regimen is in question.  Not a candidate for invasive strategy at this time.  Vascular surgery evaluated recommended conservative management with IV anticoagulation with plans to transition to oral anticoagulation within the next 1 to 2 days Plan: Restart Xarelto today.  Monitor for next 24 hours  Acute hypoxic respiratory failure, resolved Patient currently requiring 4 L nasal cannula.  Likely secondary to clot burden in setting of acute PE.  Does not have a home oxygen requirement.  Weaned to room air as of 8/12 Plan: Vitals per unit  protocol Ambulate, physical therapy occupational therapy Transition to p.o. DOAC as of today  Recurrent fevers Severe sepsis, source unclear Patient has had admissions in the past with very extensive workup without clear etiology of the fever.  Lactic acid elevated.  Procalcitonin elevated but unclear etiology.  No clear source of infection.  Vascular surgery does not feel that the IVC filter is the source.  Blood cultures thus far no growth to date.  Possible that clot burden and concomitant metabolic syndrome in the setting of morbid obesity could be contributing to these breakthrough fevers. Plan: Continue Ancef for now Monitor vitals and fever curve Monitor culture data Tentative plan discharge home 8/13.  Will transition to p.o. antibiotics at that time  History of heart failure with preserved ejection fraction Restarted Lasix, losartan, metoprolol per home dose.    History of atrial fibrillation/flutter/post ablation in 2022 Resumed home Xarelto 8/12  AKI on CKD stage IIIa Creatinine approaching baseline Avoid nephrotoxins  Morbid obesity BMI 56 This complicates overall care and prognosis   DVT prophylaxis: Xarelto Code Status: Full Family Communication: None.  Offered to call but patient declined Disposition Plan: Status is: Inpatient Remains inpatient appropriate because: Acute respiratory failure, possible sepsis, acute PE.  Clinically improving.  Anticipate discharge 8/13.   Level of care: Telemetry Medical  Consultants:  None  Procedures:  None  Antimicrobials: Cefazolin   Subjective: Seen and examined.  Comfortable appearing.  Tachypnea improved.  Weaned to room air  Objective: Vitals:   04/09/22 2019 04/10/22 0416 04/10/22 0500 04/10/22 0820  BP: 116/66 117/85  120/86  Pulse: 84 83  80  Resp: 18 18  18   Temp: 99.2 F (37.3 C) 98.5 F (36.9 C)  98.2 F (36.8 C)  TempSrc: Oral     SpO2: 94% 94%  93%  Weight:   (!) 161.3 kg   Height:         Intake/Output Summary (Last 24 hours) at 04/10/2022 1104 Last data filed at 04/10/2022 1046 Gross per 24 hour  Intake 738.3 ml  Output 1125 ml  Net -386.7 ml   Filed Weights   04/07/22 0500 04/09/22 0500 04/10/22 0500  Weight: (!) 162.5 kg (!) 165 kg (!) 161.3 kg    Examination:  General exam: NAD Respiratory system: Clear.  Normal work of breathing.  Room air Cardiovascular system: S1-S2, RRR, no murmurs, 1+ pitting edema BLE Gastrointestinal system: Obese, soft, NT/ND, normal bowel sounds Central nervous system: Alert and oriented. No focal neurological deficits. Extremities: Right lower extremity slightly swollen, tender to touch Skin: No rashes, lesions or ulcers Psychiatry: Judgement and insight appear normal. Mood & affect appropriate.     Data Reviewed: I have personally reviewed following labs and imaging studies  CBC: Recent Labs  Lab 04/06/22 1715 04/06/22 2223 04/07/22 0200 04/08/22 0609 04/09/22 0423 04/10/22 0532  WBC 12.1* 18.7* 19.0* 6.0 4.3 5.1  NEUTROABS 10.7* 15.8*  --   --   --   --   HGB 15.8 15.1 14.9 14.1 14.0 14.1  HCT 49.8 47.1 45.5 43.5 43.5 44.0  MCV 94.7 93.8 92.1 92.6 91.0 91.5  PLT 262 239 229 181 215 263   Basic Metabolic Panel: Recent Labs  Lab 04/06/22 1715 04/07/22 0200 04/08/22 0609 04/09/22 0423  NA 137 138 134* 136  K 4.3 3.9 3.7 3.3*  CL 106 110 105 105  CO2 23 21* 23 25  GLUCOSE 129* 125* 117* 125*  BUN 13 14 15 16   CREATININE 1.65* 1.56* 1.39* 1.29*  CALCIUM 9.0 8.4* 8.4* 8.4*  MG  --  1.7 2.3  --   PHOS  --  2.7 2.2*  --    GFR: Estimated Creatinine Clearance: 98.7 mL/min (A) (by C-G formula based on SCr of 1.29 mg/dL (H)). Liver Function Tests: Recent Labs  Lab 04/06/22 1715  AST 26  ALT 29  ALKPHOS 57  BILITOT 1.3*  PROT 8.4*  ALBUMIN 4.1   No results for input(s): "LIPASE", "AMYLASE" in the last 168 hours. No results for input(s): "AMMONIA" in the last 168 hours. Coagulation Profile: Recent  Labs  Lab 04/06/22 1715 04/06/22 2223  INR 1.4* 1.5*   Cardiac Enzymes: No results for input(s): "CKTOTAL", "CKMB", "CKMBINDEX", "TROPONINI" in the last 168 hours. BNP (last 3 results) No results for input(s): "PROBNP" in the last 8760 hours. HbA1C: No results for input(s): "HGBA1C" in the last 72 hours. CBG: Recent Labs  Lab 04/06/22 2241  GLUCAP 116*   Lipid Profile: No results for input(s): "CHOL", "HDL", "LDLCALC", "TRIG", "CHOLHDL", "LDLDIRECT" in the last 72 hours. Thyroid Function Tests: No results for input(s): "TSH", "T4TOTAL", "FREET4", "T3FREE", "THYROIDAB" in the last 72 hours. Anemia Panel: No results for input(s): "VITAMINB12", "FOLATE", "FERRITIN", "TIBC", "IRON", "RETICCTPCT" in the last 72 hours. Sepsis Labs: Recent Labs  Lab 04/06/22 1714 04/06/22 2223 04/07/22 0200 04/08/22 0609  PROCALCITON  --  2.27 2.79 2.16  LATICACIDVEN 2.3* 2.2*  --   --     Recent Results (from the past 240 hour(s))  Culture, blood (Routine x 2)     Status: None (Preliminary result)  Collection Time: 04/06/22  5:17 PM   Specimen: BLOOD  Result Value Ref Range Status   Specimen Description BLOOD RIGHT ANTECUBITAL  Final   Special Requests   Final    BOTTLES DRAWN AEROBIC AND ANAEROBIC Blood Culture results may not be optimal due to an excessive volume of blood received in culture bottles   Culture   Final    NO GROWTH 4 DAYS Performed at [facility], [address] [place], [place] [address]    Report Status PENDING  Incomplete  Culture, blood (Routine x 2)     Status: None (Preliminary result)   Collection Time: 04/06/22  5:18 PM   Specimen: BLOOD RIGHT HAND  Result Value Ref Range Status   Specimen Description BLOOD RIGHT HAND  Final   Special Requests   Final    BOTTLES DRAWN AEROBIC AND ANAEROBIC Blood Culture adequate volume   Culture   Final    NO GROWTH 4 DAYS Performed at [facility], [address], [place], [place] [address]     Report Status PENDING  Incomplete  Resp Panel by RT-PCR (Flu A&B, Covid) Anterior Nasal Swab     Status: None   Collection Time: 04/06/22  5:52 PM   Specimen: Anterior Nasal Swab  Result Value Ref Range Status   SARS Coronavirus 2 by RT PCR NEGATIVE NEGATIVE Final    Comment: (NOTE) SARS-CoV-2 target nucleic acids are NOT DETECTED.  The SARS-CoV-2 RNA is generally detectable in upper respiratory specimens during the acute phase of infection. The lowest concentration of SARS-CoV-2 viral copies this assay can detect is 138 copies/mL. A negative result does not preclude SARS-Cov-2 infection and should not be used as the sole basis for treatment or other patient management decisions. A negative result may occur with  improper specimen collection/handling, submission of specimen other than nasopharyngeal swab, presence of viral mutation(s) within the areas targeted by this assay, and inadequate number of viral copies(<138 copies/mL). A negative result must be combined with clinical observations, patient history, and epidemiological information. The expected result is Negative.  Fact Sheet for Patients:  BloggerCourse.com  Fact Sheet for Healthcare Providers:  SeriousBroker.it  This test is no t yet approved or cleared by the [place] FDA and  has been authorized for detection and/or diagnosis of SARS-CoV-2 by FDA under an Emergency Use Authorization (EUA). This EUA will remain  in effect (meaning this test can be used) for the duration of the COVID-19 declaration under Section 564(b)(1) of the Act, 21 U.S.C.section 360bbb-3(b)(1), unless the authorization is terminated  or revoked sooner.       Influenza A by PCR NEGATIVE NEGATIVE Final   Influenza B by PCR NEGATIVE NEGATIVE Final    Comment: (NOTE) The Xpert Xpress SARS-CoV-2/FLU/RSV plus assay is intended as an aid in the diagnosis of influenza from Nasopharyngeal swab  specimens and should not be used as a sole basis for treatment. Nasal washings and aspirates are unacceptable for Xpert Xpress SARS-CoV-2/FLU/RSV testing.  Fact Sheet for Patients: BloggerCourse.com  Fact Sheet for Healthcare Providers: SeriousBroker.it  This test is not yet approved or cleared by the [place] FDA and has been authorized for detection and/or diagnosis of SARS-CoV-2 by FDA under an Emergency Use Authorization (EUA). This EUA will remain in effect (meaning this test can be used) for the duration of the COVID-19 declaration under Section 564(b)(1) of the Act, 21 U.S.C. section 360bbb-3(b)(1), unless the authorization is terminated or revoked.  Performed at [facility],  [place], [place] [address]   Urine Culture     Status: Abnormal   Collection Time: 04/06/22  7:16 PM   Specimen: Urine, Random  Result Value Ref Range Status   Specimen Description   Final    URINE, RANDOM Performed at [facility], [address] [place], [place] [address]    Special Requests   Final    NONE Performed at [facility], [address] [place], [place] [address]    Culture (A)  Final    10,000 COLONIES/mL DIPHTHEROIDS(CORYNEBACTERIUM SPECIES) Standardized susceptibility testing for this organism is not available. Performed at [facility], [place] [address] [place], [place] [address]    Report Status 04/09/2022 FINAL  Final  MRSA Next Gen by PCR, Nasal     Status: None   Collection Time: 04/06/22 10:30 PM   Specimen: Nasal Mucosa; Nasal Swab  Result Value Ref Range Status   MRSA by PCR Next Gen NOT DETECTED NOT DETECTED Final    Comment: (NOTE) The GeneXpert MRSA Assay (FDA approved for NASAL specimens only), is one component of a comprehensive MRSA colonization surveillance program. It is not intended to diagnose MRSA infection nor to guide or monitor treatment for  MRSA infections. Test performance is not FDA approved in patients less than 40 years old. Performed at [facility], [address] [place], [place] [address]          Radiology Studies: No results found.      Scheduled Meds:  benzonatate  200 mg Oral TID   cephALEXin  500 mg Oral Q6H   Chlorhexidine Gluconate Cloth  6 each Topical Q0600   furosemide  40 mg Oral Daily   guaiFENesin  600 mg Oral BID   losartan  50 mg Oral Daily   metoprolol succinate  25 mg Oral Daily   Rivaroxaban  15 mg Oral BID WC   Followed by   Derrill Memo ON 05/01/2022] rivaroxaban  20 mg Oral Q supper   Continuous Infusions:  sodium chloride 10 mL/hr at 04/09/22 1530     LOS: 4 days     Sidney Ace, MD [facility]   If 7PM-7AM, please contact night-coverage  04/10/2022, 11:04 AM

## 2022-04-10 NOTE — Discharge Instructions (Addendum)
Information on my medicine - XARELTO (rivaroxaban)  This medication education was reviewed with me or my healthcare representative as part of my discharge preparation.   WHY WAS XARELTO PRESCRIBED FOR YOU? Xarelto was prescribed to treat blood clots that may have been found in the veins of your legs (deep vein thrombosis) or in your lungs (pulmonary embolism) and to reduce the risk of them occurring again.  What do you need to know about Xarelto? The starting dose is one 15 mg tablet taken TWICE daily with food for the FIRST 21 DAYS then on (enter date)  9/2  the dose is changed to one 20 mg tablet taken ONCE A DAY with your evening meal.  DO NOT stop taking Xarelto without talking to the health care provider who prescribed the medication.  Refill your prescription for 20 mg tablets before you run out.  After discharge, you should have regular check-up appointments with your healthcare provider that is prescribing your Xarelto.  In the future your dose may need to be changed if your kidney function changes by a significant amount.  What do you do if you miss a dose? If you are taking Xarelto TWICE DAILY and you miss a dose, take it as soon as you remember. You may take two 15 mg tablets (total 30 mg) at the same time then resume your regularly scheduled 15 mg twice daily the next day.  If you are taking Xarelto ONCE DAILY and you miss a dose, take it as soon as you remember on the same day then continue your regularly scheduled once daily regimen the next day. Do not take two doses of Xarelto at the same time.   Important Safety Information Xarelto is a blood thinner medicine that can cause bleeding. You should call your healthcare provider right away if you experience any of the following: Bleeding from an injury or your nose that does not stop. Unusual colored urine (red or dark brown) or unusual colored stools (red or black). Unusual bruising for unknown reasons. A serious fall  or if you hit your head (even if there is no bleeding).  Some medicines may interact with Xarelto and might increase your risk of bleeding while on Xarelto. To help avoid this, consult your healthcare provider or pharmacist prior to using any new prescription or non-prescription medications, including herbals, vitamins, non-steroidal anti-inflammatory drugs (NSAIDs) and supplements.  This website has more information on Xarelto: VisitDestination.com.br.

## 2022-04-10 NOTE — Consult Note (Signed)
ANTICOAGULATION CONSULT NOTE  Pharmacy Consult for IV Heparin Indication: pulmonary embolus  Patient Measurements: Height: 5\' 7"  (170.2 cm) Weight: (!) 161.3 kg (355 lb 9.6 oz) IBW/kg (Calculated) : 66.1 Heparin Dosing Weight: 106.9 kg  Labs: Recent Labs    04/08/22 0609 04/08/22 1454 04/09/22 0423 04/09/22 1106 04/10/22 0532  HGB 14.1  --  14.0  --  14.1  HCT 43.5  --  43.5  --  44.0  PLT 181  --  215  --  263  HEPARINUNFRC 0.25*   < > 0.34 0.38 0.20*  CREATININE 1.39*  --  1.29*  --   --    < > = values in this interval not displayed.    Estimated Creatinine Clearance: 98.7 mL/min (A) (by C-G formula based on SCr of 1.29 mg/dL (H)).  Medical History: Past Medical History:  Diagnosis Date   CHF (congestive heart failure) (HCC)    Diabetes mellitus without complication (HCC)    per pt-pre diabetic-Dr 06/10/22 stated said pt   DVT (deep venous thrombosis) (HCC)    Hepatic steatosis    Hypertension    Morbid obesity with BMI of 50.0-59.9, adult (HCC)    Peripheral vascular disease (HCC)    Presence of IVC filter    Ventricular trigeminy    Weakness of both lower limbs     Medications:  Xarelto 20 mg daily prior to admission Query compliance, fill records showing last filled 01/16/22 for a 30 day supply Heparin level not elevated at baseline as would be expected if taking Xarelto  Assessment: Patient is a 51 y/o M with medical history including 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 There is concern for infection of IVC filter. Pharmacy consulted to initiate and manage IV heparin for acute PE.  Baseline labs: aPTT 29 sec, INR 1.4, Hgb 15.8, Plts 262  Goal of Therapy:  Heparin level 0.3-0.7 units/ml Monitor platelets by anticoagulation protocol: Yes  8/12 0532  HL 0.2, subtherapeutic   Plan:  --Pt to transition to Xarelto @ 0800 today --Continue heparin at 2300 units/hr until D/C at 0800   10/12, PharmD, [facility] 04/10/2022 6:32 AM

## 2022-04-11 DIAGNOSIS — J9601 Acute respiratory failure with hypoxia: Secondary | ICD-10-CM | POA: Diagnosis not present

## 2022-04-11 DIAGNOSIS — A419 Sepsis, unspecified organism: Secondary | ICD-10-CM | POA: Diagnosis not present

## 2022-04-11 DIAGNOSIS — D689 Coagulation defect, unspecified: Secondary | ICD-10-CM | POA: Diagnosis not present

## 2022-04-11 DIAGNOSIS — R6520 Severe sepsis without septic shock: Secondary | ICD-10-CM | POA: Diagnosis not present

## 2022-04-11 LAB — CULTURE, BLOOD (ROUTINE X 2)
Culture: NO GROWTH
Culture: NO GROWTH
Special Requests: ADEQUATE

## 2022-04-11 MED ORDER — BENZONATATE 200 MG PO CAPS: 200.0000 mg | ORAL_CAPSULE | Freq: Three times a day (TID) | ORAL | 0 refills | Status: DC

## 2022-04-11 MED ORDER — CEPHALEXIN 500 MG PO CAPS: 500.0000 mg | ORAL_CAPSULE | Freq: Four times a day (QID) | ORAL | 0 refills | Status: AC

## 2022-04-11 MED ORDER — RIVAROXABAN (XARELTO) VTE STARTER PACK (15 & 20 MG)
ORAL_TABLET | ORAL | 0 refills | Status: DC
Start: 2022-04-11 — End: 2022-10-15

## 2022-04-11 NOTE — Progress Notes (Signed)
Spoke to the patient  He lives at home with his wife and daughter He works out at [employer] and walks the track daily,  He does not qualify for [facility] He stated that he does not need PT No additional needs at this time His wife will transport home and he drives

## 2022-04-11 NOTE — Plan of Care (Signed)
AVS reviewed with patient. IV removed intact. Patient transported to vehicle via nursing.

## 2022-04-11 NOTE — Discharge Summary (Signed)
Physician Discharge Summary  Ryan [name] [id number] DOB: 1971/07/05 DOA: 04/06/2022  PCP: Jon Billings, NP  Admit date: 04/06/2022 Discharge date: 04/11/2022  Admitted From: Home Disposition: Home  Recommendations for Outpatient Follow-up:  Follow up with PCP in 1-2 weeks Follow-up with hematology as directed  Home Health: No Equipment/Devices: None  Discharge Condition: Stable CODE STATUS: Full Diet recommendation: Heart healthy  Brief/Interim Summary:  51y.o. male with medical history significant of HTN, pre-DM, with recurrent DVT/PE s/p IVC filter x2 (2005, 2012 on Xarelto, dCHF,, PVD, CKD stage IIIa, atrial fibrillation (s/p of ablation per pt, triggered by COVID infection,recurrent pneumonia, Cellulitis of right foot who presented to the ED with chief complaints of SOB, RLE pain, fevers and chills.   Seen in consultation by vascular surgery.  IVC filter x 2 felt not to be the source of infection or fever.  Adherence to prescribed Xarelto is in question.  Patient is currently on a heparin drip.  He is on 4 L nasal cannula.  Starting to feel better.   Weaned to room air as of 8/12.  Stable and afebrile as of 8/13.  Appropriate for discharge home at this time.  Offered home health, patient declined.  No fevers noted.  Suspicion that fevers were driven by clot burden rather than infection however unable to exclude sepsis secondary to right lower extremity cellulitis.  We will convert to oral antibiotics.  Restart Xarelto, 50 mg twice daily x21 days followed by 20 mg daily.  Appropriate for discharge home.    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  Adherence to prescribed anticoagulation regimen is in question.  Not a candidate for invasive strategy at this time.  Vascular surgery evaluated recommended conservative management with IV anticoagulation with plans to transition to oral anticoagulation within the next 1 to 2 days Plan: DC home with Xarelto starter pack.  50 mg twice daily x21 days followed by 20 mg daily indefinitely   Acute hypoxic respiratory failure, resolved Patient currently requiring 4 L nasal cannula.  Likely secondary to clot burden in setting of acute PE.  Does not have a home oxygen requirement.  Weaned to room air as of 8/12 Plan: On room air at time of discharge   Recurrent fevers Severe sepsis, source unclear Patient has had admissions in the past with very extensive workup without clear etiology of the fever.  Lactic acid elevated.  Procalcitonin elevated but unclear etiology.  No clear source of infection.  Vascular surgery does not feel that the IVC filter is the source.  Blood cultures thus far no growth to date.  Possible that clot burden and concomitant metabolic syndrome in the setting of morbid obesity could be contributing to these breakthrough fevers. Plan: Unable to exclude sepsis.  However only source is a right lower extremity cellulitis.  Discontinue Ancef.  Convert to Keflex.  Complete additional 3 days for total 7-day antibiotic course  Discharge Instructions  Discharge Instructions     Diet - low sodium heart healthy   Complete  by: As directed    Increase activity slowly   Complete by: As directed       Allergies as of 04/11/2022       Reactions   Metrizamide Other (See Comments)   Kidney failure requiring dialysis Kidney failure requiring dialysis   Clindamycin/lincomycin Rash   Iodinated  Contrast Media Other (See Comments)   H/o kidney failure so was told not to receive contrast. NOT ALLERGY.   Lincomycin Rash   Sulfa Antibiotics Rash        Medication List     STOP taking these medications    rivaroxaban 20 MG Tabs tablet Commonly known as: XARELTO Replaced by: Rivaroxaban Stater Pack (15 mg and 20 mg)       TAKE these medications    albuterol 108 (90 Base) MCG/ACT inhaler Commonly known as: VENTOLIN HFA Inhale 2 puffs into the lungs every 6 (six) hours as needed for wheezing or shortness of breath.   benzonatate 200 MG capsule Commonly known as: TESSALON Take 1 capsule (200 mg total) by mouth 3 (three) times daily.   cephALEXin 500 MG capsule Commonly known as: KEFLEX Take 1 capsule (500 mg total) by mouth every 6 (six) hours for 3 days.   empagliflozin 25 MG Tabs tablet Commonly known as: Jardiance Take 1 tablet (25 mg total) by mouth daily before breakfast.   furosemide 40 MG tablet Commonly known as: Lasix Take 1 tablet (40 mg total) by mouth daily.   losartan 50 MG tablet Commonly known as: COZAAR Take 1 tablet (50 mg total) by mouth daily.   magnesium oxide 400 (240 Mg) MG tablet Commonly known as: MAG-OX Take 400 mg by mouth daily.   metoprolol succinate 25 MG 24 hr tablet Commonly known as: TOPROL-XL Take 1 tablet (25 mg total) by mouth daily.   Rivaroxaban Stater Pack (15 mg and 20 mg) Commonly known as: XARELTO STARTER PACK Follow package directions: Take one 15mg  tablet by mouth twice a day. On day 22, switch to one 20mg  tablet once a day. Take with food. Replaces: rivaroxaban 20 MG Tabs tablet   tirzepatide 2.5 MG/0.5ML Pen Commonly known as: MOUNJARO Inject 2.5 mg into the skin once a week.        Follow-up Information     Jon Billings, NP. Schedule an appointment as soon as possible for a visit in 1 week(s).   Specialty: Nurse Practitioner Contact information: [place]  [id number] [phone]                Allergies  Allergen Reactions   Metrizamide Other (See Comments)    Kidney failure requiring dialysis Kidney failure requiring dialysis    Clindamycin/Lincomycin Rash   Iodinated Contrast Media Other (See Comments)    H/o kidney failure so was told not to receive contrast. NOT ALLERGY.    Lincomycin Rash   Sulfa Antibiotics Rash    Consultations: None   Procedures/Studies: DG Chest Port 1 View  Result Date: 04/10/2022 CLINICAL DATA:  Shortness of breath EXAM: PORTABLE CHEST 1 VIEW COMPARISON:  04/06/2022 FINDINGS: The heart size and mediastinal contours are within normal limits. No new focal airspace consolidation, pleural effusion, or pneumothorax. The visualized skeletal structures are unremarkable. IMPRESSION: No active disease. Electronically Signed   By: Davina Poke D.O.   On: 04/10/2022 12:56   ECHOCARDIOGRAM COMPLETE  Result Date: 04/08/2022    ECHOCARDIOGRAM REPORT   Patient Name:   Ryan [name] Date of Exam: 04/08/2022 Medical  Rec #:  [id number]     Height:       67.0 in Accession #:    [id number]    Weight:       358.2 lb Date of Birth:  [birth year]     BSA:          2.591 m Patient Age:    16 years      BP:           144/96 mmHg Patient Gender: M             HR:           79 bpm. Exam Location:  [facility] Procedure: 2D Echo, Color Doppler and Cardiac Doppler Indications:     I26.09 Pulmonary Embolus  History:         Patient has prior history of Echocardiogram examinations, most                  recent 02/21/2022. CHF, PVD; Risk Factors:Hypertension and                  Diabetes.  Sonographer:     Charmayne Sheer Referring Phys:  [id number] Teressa Lower Diagnosing Phys: Kathlyn Sacramento MD  Sonographer Comments: No subcostal window. Image acquisition challenging due to patient body habitus. IMPRESSIONS  1. Left ventricular ejection fraction, by estimation, is 55 to 60%. The left ventricle has normal function. The left ventricle has no regional  wall motion abnormalities. Left ventricular diastolic parameters were normal.  2. Right ventricular systolic function is mildly reduced. The right ventricular size is mildly enlarged.  3. The mitral valve is normal in structure. No evidence of mitral valve regurgitation. No evidence of mitral stenosis.  4. The aortic valve is normal in structure. Aortic valve regurgitation is not visualized. No aortic stenosis is present. FINDINGS  Left Ventricle: Left ventricular ejection fraction, by estimation, is 55 to 60%. The left ventricle has normal function. The left ventricle has no regional wall motion abnormalities. The left ventricular internal cavity size was normal in size. There is  no left ventricular hypertrophy. Left ventricular diastolic parameters were normal. Right Ventricle: The right ventricular size is mildly enlarged. No increase in right ventricular wall thickness. Right ventricular systolic function is mildly reduced. Left Atrium: Left atrial size was normal in size. Right Atrium: Right atrial size was normal in size. Pericardium: There is no evidence of pericardial effusion. Mitral Valve: The mitral valve is normal in structure. No evidence of mitral valve regurgitation. No evidence of mitral valve stenosis. Tricuspid Valve: The tricuspid valve is normal in structure. Tricuspid valve regurgitation is not demonstrated. No evidence of tricuspid stenosis. Aortic Valve: The aortic valve is normal in structure. Aortic valve regurgitation is not visualized. No aortic stenosis is present. Aortic valve mean gradient measures 5.0 mmHg. Aortic valve peak gradient measures 10.8 mmHg. Aortic valve area, by VTI measures 3.14 cm. Pulmonic Valve: The pulmonic valve was normal in structure. Pulmonic valve regurgitation is trivial. No evidence of pulmonic stenosis. Aorta: The aortic root is normal in size and structure. Venous: The inferior vena cava was not well visualized. IAS/Shunts: No atrial level shunt detected by  color flow Doppler.  LEFT VENTRICLE PLAX 2D LVIDd:         3.86 cm   Diastology LVIDs:         2.71 cm   LV e' medial:    7.83 cm/s LV PW:         1.07 cm  LV E/e' medial:  8.7 LV IVS:        1.01 cm   LV e' lateral:   13.60 cm/s LVOT diam:     2.30 cm   LV E/e' lateral: 5.0 LV SV:         80 LV SV Index:   31 LVOT Area:     4.15 cm  RIGHT VENTRICLE RV Basal diam:  3.78 cm RV Mid diam:    4.44 cm RV S prime:     14.40 cm/s TAPSE (M-mode): 2.8 cm LEFT ATRIUM             Index        RIGHT ATRIUM           Index LA diam:        3.80 cm 1.47 cm/m   RA Area:     21.80 cm LA Vol (A2C):   35.6 ml 13.74 ml/m  RA Volume:   56.70 ml  21.89 ml/m LA Vol (A4C):   32.2 ml 12.43 ml/m LA Biplane Vol: 36.2 ml 13.97 ml/m  AORTIC VALVE                     PULMONIC VALVE AV Area (Vmax):    2.71 cm      PV Vmax:       0.61 m/s AV Area (Vmean):   3.01 cm      PV Peak grad:  1.5 mmHg AV Area (VTI):     3.14 cm AV Vmax:           164.00 cm/s AV Vmean:          103.000 cm/s AV VTI:            0.255 m AV Peak Grad:      10.8 mmHg AV Mean Grad:      5.0 mmHg LVOT Vmax:         107.00 cm/s LVOT Vmean:        74.700 cm/s LVOT VTI:          0.193 m LVOT/AV VTI ratio: 0.76  AORTA Ao Root diam: 3.50 cm MITRAL VALVE MV Area (PHT): 3.76 cm    SHUNTS MV Decel Time: 202 msec    Systemic VTI:  0.19 m MV E velocity: 67.87 cm/s  Systemic Diam: 2.30 cm MV A velocity: 63.60 cm/s MV E/A ratio:  1.07 Kathlyn Sacramento MD Electronically signed by Kathlyn Sacramento MD Signature Date/Time: 04/08/2022/5:44:07 PM    Final    US Venous Img Lower Bilateral (DVT)  Result Date: 04/07/2022 CLINICAL DATA:  Bilateral lower extremity pain and edema. History prior bilateral chronic lower extremity DVT and pulmonary embolism. EXAM: BILATERAL LOWER EXTREMITY VENOUS DOPPLER ULTRASOUND TECHNIQUE: Gray-scale sonography with graded compression, as well as color Doppler and duplex ultrasound were performed to evaluate the lower extremity deep venous systems from the  level of the common femoral vein and including the common femoral, femoral, profunda femoral, popliteal and calf veins including the posterior tibial, peroneal and gastrocnemius veins when visible. The superficial great saphenous vein was also interrogated. Spectral Doppler was utilized to evaluate flow at rest and with distal augmentation maneuvers in the common femoral, femoral and popliteal veins. COMPARISON:  01/01/2022 FINDINGS: RIGHT LOWER EXTREMITY Common Femoral Vein: Stable nonocclusive chronic mural thrombus. Saphenofemoral Junction: Patent saphenofemoral junction. Profunda Femoral Vein: Stable nonocclusive chronic mural thrombus. Femoral Vein: Stable nonocclusive chronic mural thrombus. Popliteal Vein: Stable nonocclusive chronic mural thrombus. Calf Veins:  Probable nonocclusive chronic thrombus in the posterior tibial vein. The peroneal vein is not visualized. Superficial Great Saphenous Vein: No evidence of thrombus. Normal compressibility. Venous Reflux:  None. Other Findings: No evidence of superficial thrombophlebitis or abnormal fluid collection. LEFT LOWER EXTREMITY Common Femoral Vein: Stable nonocclusive chronic mural thrombus. Saphenofemoral Junction: Patent. Profunda Femoral Vein: Stable nonocclusive chronic mural thrombus. Femoral Vein: Stable nonocclusive chronic mural thrombus. Popliteal Vein: Stable nonocclusive chronic mural thrombus and web formation. Calf Veins: Nonocclusive chronic thrombus in posterior tibial and peroneal veins. Superficial Great Saphenous Vein: No evidence of thrombus. Normal compressibility. Venous Reflux:  None. Other Findings: No evidence of superficial thrombophlebitis or abnormal fluid collection. IMPRESSION: No evidence of acute DVT in either lower extremity. Similar findings as the prior study of chronic nonocclusive mural thrombus throughout much of the deep venous system in both lower extremities. Electronically Signed   By: Aletta Edouard M.D.   On:  04/07/2022 11:37   CT ABDOMEN PELVIS W CONTRAST  Result Date: 04/06/2022 CLINICAL DATA:  Abdominal pain, acute, nonlocalized. Fever and chills. EXAM: CT ABDOMEN AND PELVIS WITH CONTRAST TECHNIQUE: Multidetector CT imaging of the abdomen and pelvis was performed using the standard protocol following bolus administration of intravenous contrast. RADIATION DOSE REDUCTION: This exam was performed according to the departmental dose-optimization program which includes automated exposure control, adjustment of the mA and/or kV according to patient size and/or use of iterative reconstruction technique. CONTRAST:  139mL OMNIPAQUE IOHEXOL 350 MG/ML SOLN COMPARISON:  02/23/2017 FINDINGS: Lower chest: Lung bases are clear. Hepatobiliary: Liver parenchyma is normal.  No calcified gallstones. Pancreas: Normal Spleen: Normal Adrenals/Urinary Tract: Adrenal glands are normal. Kidneys are normal. Bladder is normal. Stomach/Bowel: Stomach and small intestine are normal. There is diverticulosis of the colon without visible diverticulitis. Vascular/Lymphatic: IVC filters in place both above and below the level of the renal arteries. The aorta is normal. No retroperitoneal lymphadenopathy in the abdominal region. There chronic bilateral inguinal to iliac lymph nodes, unchanged since 2018 and therefore not likely malignant. Reproductive: Normal Other: No free fluid or air. Musculoskeletal: Periumbilical hernia containing only fat. Numerous abdominal wall varicosities, possibly related to venous return issues in the setting of the IVC filters. IMPRESSION: No acute abdominal inflammatory finding. Solid organs appear unremarkable. No acute bowel finding. Diverticulosis of the colon but without evidence of diverticulitis. IVC filters in place above and below the level of the renal veins. Question poor venous return, based on the presence of numerous venous collateral superficially. Umbilical hernia containing only fat. Electronically  Signed   By: Nelson Chimes M.D.   On: 04/06/2022 19:01   CT Angio Chest PE W and/or Wo Contrast  Result Date: 04/06/2022 CLINICAL DATA:  Pulmonary embolism suspected, high probability. Fever and chills. Right leg pain. EXAM: CT ANGIOGRAPHY CHEST WITH CONTRAST TECHNIQUE: Multidetector CT imaging of the chest was performed using the standard protocol during bolus administration of intravenous contrast. Multiplanar CT image reconstructions and MIPs were obtained to evaluate the vascular anatomy. RADIATION DOSE REDUCTION: This exam was performed according to the departmental dose-optimization program which includes automated exposure control, adjustment of the mA and/or kV according to patient size and/or use of iterative reconstruction technique. CONTRAST:  138mL OMNIPAQUE IOHEXOL 350 MG/ML SOLN COMPARISON:  Radiography same day.  CT 02/20/2022. FINDINGS: Cardiovascular: Heart size is normal. No pericardial effusion. No visible coronary artery calcification or aortic atherosclerotic calcification. Pulmonary arterial opacification is moderate. I think there is pulmonary embolus in the left lower lobe to lingular branches. Prominent  main pulmonary artery which could indicate pulmonary arterial hypertension, which is chronic. No sign of right heart strain acutely. Mediastinum/Nodes: No mass or lymphadenopathy. Lungs/Pleura: No pleural effusion. The lungs are clear. No infiltrate, collapse, mass or nodule. Upper Abdomen: See results of abdominal CT. Musculoskeletal: Ordinary degenerative changes of the spine. No rib fracture. Incidental varicosities of the anterior chest wall. Review of the MIP images confirms the above findings. IMPRESSION: Positive for pulmonary embolism in the left lower lobe to lingular branches. Pulmonary arterial opacification is only moderate and findings could be somewhat debatable. Electronically Signed   By: Nelson Chimes M.D.   On: 04/06/2022 18:56   DG Chest Port 1 View  Result Date:  04/06/2022 CLINICAL DATA:  Sepsis EXAM: PORTABLE CHEST 1 VIEW COMPARISON:  02/20/2022 FINDINGS: Single frontal view of the chest demonstrates an unremarkable cardiac silhouette. No airspace disease, effusion, or pneumothorax. No acute bony abnormalities. IMPRESSION: 1. No acute intrathoracic process. Electronically Signed   By: Randa Ngo M.D.   On: 04/06/2022 17:29      Subjective: Seen and examined the day of discharge.  Stable no distress.  On room air.  No pain.  Stable for discharge home.  Discharge Exam: Vitals:   04/11/22 0527 04/11/22 0749  BP: 109/65 114/81  Pulse: 76 67  Resp: 20 16  Temp: 97.8 F (36.6 C) 98.2 F (36.8 C)  SpO2: 95% 94%   Vitals:   04/10/22 2009 04/11/22 0500 04/11/22 0527 04/11/22 0749  BP: 133/80  109/65 114/81  Pulse: 87  76 67  Resp: 20  20 16   Temp: 98.6 F (37 C)  97.8 F (36.6 C) 98.2 F (36.8 C)  TempSrc:      SpO2: 94%  95% 94%  Weight:  (!) 158.4 kg    Height:        General: Pt is alert, awake, not in acute distress Cardiovascular: RRR, S1/S2 +, no rubs, no gallops Respiratory: CTA bilaterally, no wheezing, no rhonchi Abdominal: Soft, NT, ND, bowel sounds + Extremities: no edema, no cyanosis    The results of significant diagnostics from this hospitalization (including imaging, microbiology, ancillary and laboratory) are listed below for reference.     Microbiology: Recent Results (from the past 240 hour(s))  Culture, blood (Routine x 2)     Status: None   Collection Time: 04/06/22  5:17 PM   Specimen: BLOOD  Result Value Ref Range Status   Specimen Description BLOOD RIGHT ANTECUBITAL  Final   Special Requests   Final    BOTTLES DRAWN AEROBIC AND ANAEROBIC Blood Culture results may not be optimal due to an excessive volume of blood received in culture bottles   Culture   Final    NO GROWTH 5 DAYS Performed at [facility], [address] [place], [place] [address]    Report Status 04/11/2022 FINAL  Final   Culture, blood (Routine x 2)     Status: None   Collection Time: 04/06/22  5:18 PM   Specimen: BLOOD RIGHT HAND  Result Value Ref Range Status   Specimen Description BLOOD RIGHT HAND  Final   Special Requests   Final    BOTTLES DRAWN AEROBIC AND ANAEROBIC Blood Culture adequate volume   Culture   Final    NO GROWTH 5 DAYS Performed at [facility], [address], [place], [place] [address]    Report Status 04/11/2022 FINAL  Final  Resp Panel by RT-PCR (Flu A&B, Covid) Anterior Nasal Swab  Status: None   Collection Time: 04/06/22  5:52 PM   Specimen: Anterior Nasal Swab  Result Value Ref Range Status   SARS Coronavirus 2 by RT PCR NEGATIVE NEGATIVE Final    Comment: (NOTE) SARS-CoV-2 target nucleic acids are NOT DETECTED.  The SARS-CoV-2 RNA is generally detectable in upper respiratory specimens during the acute phase of infection. The lowest concentration of SARS-CoV-2 viral copies this assay can detect is 138 copies/mL. A negative result does not preclude SARS-Cov-2 infection and should not be used as the sole basis for treatment or other patient management decisions. A negative result may occur with  improper specimen collection/handling, submission of specimen other than nasopharyngeal swab, presence of viral mutation(s) within the areas targeted by this assay, and inadequate number of viral copies(<138 copies/mL). A negative result must be combined with clinical observations, patient history, and epidemiological information. The expected result is Negative.  Fact Sheet for Patients:  EntrepreneurPulse.com.au  Fact Sheet for Healthcare Providers:  IncredibleEmployment.be  This test is no t yet approved or cleared by the [place] FDA and  has been authorized for detection and/or diagnosis of SARS-CoV-2 by FDA under an Emergency Use Authorization (EUA). This EUA will remain  in effect (meaning this test can be used)  for the duration of the COVID-19 declaration under Section 564(b)(1) of the Act, 21 U.S.C.section 360bbb-3(b)(1), unless the authorization is terminated  or revoked sooner.       Influenza A by PCR NEGATIVE NEGATIVE Final   Influenza B by PCR NEGATIVE NEGATIVE Final    Comment: (NOTE) The Xpert Xpress SARS-CoV-2/FLU/RSV plus assay is intended as an aid in the diagnosis of influenza from Nasopharyngeal swab specimens and should not be used as a sole basis for treatment. Nasal washings and aspirates are unacceptable for Xpert Xpress SARS-CoV-2/FLU/RSV testing.  Fact Sheet for Patients: EntrepreneurPulse.com.au  Fact Sheet for Healthcare Providers: IncredibleEmployment.be  This test is not yet approved or cleared by the [place] FDA and has been authorized for detection and/or diagnosis of SARS-CoV-2 by FDA under an Emergency Use Authorization (EUA). This EUA will remain in effect (meaning this test can be used) for the duration of the COVID-19 declaration under Section 564(b)(1) of the Act, 21 U.S.C. section 360bbb-3(b)(1), unless the authorization is terminated or revoked.  Performed at [facility], [address], [place], [place] [address]   Urine Culture     Status: Abnormal   Collection Time: 04/06/22  7:16 PM   Specimen: Urine, Random  Result Value Ref Range Status   Specimen Description   Final    URINE, RANDOM Performed at [facility], [address], [place], [place] [address]    Special Requests   Final    NONE Performed at [facility], [address] [place], [place] [address]    Culture (A)  Final    10,000 COLONIES/mL DIPHTHEROIDS(CORYNEBACTERIUM SPECIES) Standardized susceptibility testing for this organism is not available. Performed at [facility], [place] [address] [place],  [address]    Report Status 04/09/2022 FINAL  Final  MRSA Next Gen by PCR, Nasal     Status:  None   Collection Time: 04/06/22 10:30 PM   Specimen: Nasal Mucosa; Nasal Swab  Result Value Ref Range Status   MRSA by PCR Next Gen NOT DETECTED NOT DETECTED Final    Comment: (NOTE) The GeneXpert MRSA Assay (FDA approved for NASAL specimens only), is one component of a comprehensive MRSA colonization surveillance program. It is  not intended to diagnose MRSA infection nor to guide or monitor treatment for MRSA infections. Test performance is not FDA approved in patients less than 66 years old. Performed at [facility], [address] [place], [place] [address]      Labs: BNP (last 3 results) Recent Labs    01/28/22 0221 02/20/22 1444 04/06/22 2223  BNP 14.6 94.4 34.6   Basic Metabolic Panel: Recent Labs  Lab 04/06/22 1715 04/07/22 0200 04/08/22 0609 04/09/22 0423  NA 137 138 134* 136  K 4.3 3.9 3.7 3.3*  CL 106 110 105 105  CO2 23 21* 23 25  GLUCOSE 129* 125* 117* 125*  BUN 13 14 15 16   CREATININE 1.65* 1.56* 1.39* 1.29*  CALCIUM 9.0 8.4* 8.4* 8.4*  MG  --  1.7 2.3  --   PHOS  --  2.7 2.2*  --    Liver Function Tests: Recent Labs  Lab 04/06/22 1715  AST 26  ALT 29  ALKPHOS 57  BILITOT 1.3*  PROT 8.4*  ALBUMIN 4.1   No results for input(s): "LIPASE", "AMYLASE" in the last 168 hours. No results for input(s): "AMMONIA" in the last 168 hours. CBC: Recent Labs  Lab 04/06/22 1715 04/06/22 2223 04/07/22 0200 04/08/22 0609 04/09/22 0423 04/10/22 0532  WBC 12.1* 18.7* 19.0* 6.0 4.3 5.1  NEUTROABS 10.7* 15.8*  --   --   --   --   HGB 15.8 15.1 14.9 14.1 14.0 14.1  HCT 49.8 47.1 45.5 43.5 43.5 44.0  MCV 94.7 93.8 92.1 92.6 91.0 91.5  PLT 262 239 229 181 215 263   Cardiac Enzymes: No results for input(s): "CKTOTAL", "CKMB", "CKMBINDEX", "TROPONINI" in the last 168 hours. BNP: Invalid input(s): "POCBNP" CBG: Recent Labs  Lab 04/06/22 2241  GLUCAP 116*   D-Dimer No results for input(s): "DDIMER" in the last 72 hours. Hgb A1c No  results for input(s): "HGBA1C" in the last 72 hours. Lipid Profile No results for input(s): "CHOL", "HDL", "LDLCALC", "TRIG", "CHOLHDL", "LDLDIRECT" in the last 72 hours. Thyroid function studies No results for input(s): "TSH", "T4TOTAL", "T3FREE", "THYROIDAB" in the last 72 hours.  Invalid input(s): "FREET3" Anemia work up No results for input(s): "VITAMINB12", "FOLATE", "FERRITIN", "TIBC", "IRON", "RETICCTPCT" in the last 72 hours. Urinalysis    Component Value Date/Time   COLORURINE YELLOW (A) 04/06/2022 1916   APPEARANCEUR CLEAR (A) 04/06/2022 1916   APPEARANCEUR Clear 03/18/2022 1436   LABSPEC 1.025 04/06/2022 1916   LABSPEC 1.012 12/25/2014 1655   PHURINE 5.0 04/06/2022 1916   GLUCOSEU >=500 (A) 04/06/2022 1916   GLUCOSEU Negative 12/25/2014 1655   HGBUR NEGATIVE 04/06/2022 1916   BILIRUBINUR NEGATIVE 04/06/2022 1916   BILIRUBINUR Negative 03/18/2022 1436   BILIRUBINUR Negative 12/25/2014 1655   KETONESUR NEGATIVE 04/06/2022 1916   PROTEINUR NEGATIVE 04/06/2022 1916   NITRITE NEGATIVE 04/06/2022 1916   LEUKOCYTESUR NEGATIVE 04/06/2022 1916   LEUKOCYTESUR Negative 12/25/2014 1655   Sepsis Labs Recent Labs  Lab 04/07/22 0200 04/08/22 0609 04/09/22 0423 04/10/22 0532  WBC 19.0* 6.0 4.3 5.1   Microbiology Recent Results (from the past 240 hour(s))  Culture, blood (Routine x 2)     Status: None   Collection Time: 04/06/22  5:17 PM   Specimen: BLOOD  Result Value Ref Range Status   Specimen Description BLOOD RIGHT ANTECUBITAL  Final   Special Requests   Final    BOTTLES DRAWN AEROBIC AND ANAEROBIC Blood Culture results may not be optimal due to an excessive volume of blood  received in culture bottles   Culture   Final    NO GROWTH 5 DAYS Performed at [facility], [address] [place], [place] [address]    Report Status 04/11/2022 FINAL  Final  Culture, blood (Routine x 2)     Status: None   Collection Time: 04/06/22  5:18 PM   Specimen: BLOOD RIGHT  HAND  Result Value Ref Range Status   Specimen Description BLOOD RIGHT HAND  Final   Special Requests   Final    BOTTLES DRAWN AEROBIC AND ANAEROBIC Blood Culture adequate volume   Culture   Final    NO GROWTH 5 DAYS Performed at [facility], [address] [place], [place] [address]    Report Status 04/11/2022 FINAL  Final  Resp Panel by RT-PCR (Flu A&B, Covid) Anterior Nasal Swab     Status: None   Collection Time: 04/06/22  5:52 PM   Specimen: Anterior Nasal Swab  Result Value Ref Range Status   SARS Coronavirus 2 by RT PCR NEGATIVE NEGATIVE Final    Comment: (NOTE) SARS-CoV-2 target nucleic acids are NOT DETECTED.  The SARS-CoV-2 RNA is generally detectable in upper respiratory specimens during the acute phase of infection. The lowest concentration of SARS-CoV-2 viral copies this assay can detect is 138 copies/mL. A negative result does not preclude SARS-Cov-2 infection and should not be used as the sole basis for treatment or other patient management decisions. A negative result may occur with  improper specimen collection/handling, submission of specimen other than nasopharyngeal swab, presence of viral mutation(s) within the areas targeted by this assay, and inadequate number of viral copies(<138 copies/mL). A negative result must be combined with clinical observations, patient history, and epidemiological information. The expected result is Negative.  Fact Sheet for Patients:  BloggerCourse.com  Fact Sheet for Healthcare Providers:  SeriousBroker.it  This test is no t yet approved or cleared by the [place] FDA and  has been authorized for detection and/or diagnosis of SARS-CoV-2 by FDA under an Emergency Use Authorization (EUA). This EUA will remain  in effect (meaning this test can be used) for the duration of the COVID-19 declaration under Section 564(b)(1) of the Act, 21 U.S.C.section  360bbb-3(b)(1), unless the authorization is terminated  or revoked sooner.       Influenza A by PCR NEGATIVE NEGATIVE Final   Influenza B by PCR NEGATIVE NEGATIVE Final    Comment: (NOTE) The Xpert Xpress SARS-CoV-2/FLU/RSV plus assay is intended as an aid in the diagnosis of influenza from Nasopharyngeal swab specimens and should not be used as a sole basis for treatment. Nasal washings and aspirates are unacceptable for Xpert Xpress SARS-CoV-2/FLU/RSV testing.  Fact Sheet for Patients: BloggerCourse.com  Fact Sheet for Healthcare Providers: SeriousBroker.it  This test is not yet approved or cleared by the [place] FDA and has been authorized for detection and/or diagnosis of SARS-CoV-2 by FDA under an Emergency Use Authorization (EUA). This EUA will remain in effect (meaning this test can be used) for the duration of the COVID-19 declaration under Section 564(b)(1) of the Act, 21 U.S.C. section 360bbb-3(b)(1), unless the authorization is terminated or revoked.  Performed at [facility], [address], [place], [place] [address]   Urine Culture     Status: Abnormal   Collection Time: 04/06/22  7:16 PM   Specimen: Urine, Random  Result Value Ref Range Status   Specimen Description   Final    URINE, RANDOM Performed at [facility], [address] [place], [place] [address]    Special Requests   Final    NONE Performed at [facility], [address] [place], [place] [address]    Culture (A)  Final    10,000 COLONIES/mL DIPHTHEROIDS(CORYNEBACTERIUM SPECIES) Standardized susceptibility testing for this organism is not available. Performed at [facility], [place] [address], [place], [place] [address]    Report Status 04/09/2022 FINAL  Final  MRSA Next Gen by PCR, Nasal     Status: None   Collection Time: 04/06/22 10:30 PM   Specimen: Nasal Mucosa; Nasal Swab  Result Value Ref  Range Status   MRSA by PCR Next Gen NOT DETECTED NOT DETECTED Final    Comment: (NOTE) The GeneXpert MRSA Assay (FDA approved for NASAL specimens only), is one component of a comprehensive MRSA colonization surveillance program. It is not intended to diagnose MRSA infection nor to guide or monitor treatment for MRSA infections. Test performance is not FDA approved in patients less than 25 years old. Performed at [facility], [address] [place],  [address]      Time coordinating discharge: Over 30 minutes  SIGNED:   Sidney Ace, MD  [facility] 04/11/2022, 10:38 AM Pager   If 7PM-7AM, please contact night-coverage

## 2022-04-11 NOTE — Plan of Care (Signed)

## 2022-04-12 ENCOUNTER — Ambulatory Visit (INDEPENDENT_AMBULATORY_CARE_PROVIDER_SITE_OTHER): Payer: No Typology Code available for payment source | Admitting: Physician Assistant

## 2022-04-12 ENCOUNTER — Encounter: Payer: Self-pay | Admitting: Physician Assistant

## 2022-04-12 VITALS — BP 127/81 | HR 99 | Temp 99.0°F | Wt 351.4 lb

## 2022-04-12 DIAGNOSIS — R6520 Severe sepsis without septic shock: Secondary | ICD-10-CM | POA: Diagnosis not present

## 2022-04-12 DIAGNOSIS — I8290 Acute embolism and thrombosis of unspecified vein: Secondary | ICD-10-CM

## 2022-04-12 DIAGNOSIS — L03115 Cellulitis of right lower limb: Secondary | ICD-10-CM | POA: Insufficient documentation

## 2022-04-12 DIAGNOSIS — A419 Sepsis, unspecified organism: Secondary | ICD-10-CM | POA: Diagnosis not present

## 2022-04-12 DIAGNOSIS — J9601 Acute respiratory failure with hypoxia: Secondary | ICD-10-CM

## 2022-04-12 NOTE — Patient Instructions (Addendum)
Please finish your antibiotic as directed I recommend that you follow up with Hematology to recheck your Xarelto levels- this may help [place] figure out why you have repeat clotting problems  You can take Tylenol 500 mg , two pills up to three times per day as needed for pain No more than 3500 mg of Tylenol per 24 hours or this could lead to liver injury.

## 2022-04-12 NOTE — Assessment & Plan Note (Addendum)
Acute, recurrent, appears resolved at this time Unsure of source of infection from hospital discharge information Patient is taking Keflex for cellulitis of right lower leg - continue this for resolution Will draw CBC and CMP for monitoring

## 2022-04-12 NOTE — Assessment & Plan Note (Signed)
Acute, new concern Right calf is swollen and red compared to left leg Patient is on Xarelto and keflex from hospital admission Recommend he continue with Kelfex for cellulitis resolution Recommend Tylenol as needed for pain Follow up as needed for persistent or progressing symptoms

## 2022-04-12 NOTE — Progress Notes (Signed)
Established Patient Office Visit  Name: Ryan [name]   MRN: [id number]    DOB: [birth year]   Date:04/12/2022  Today's Provider: Talitha Givens, MHS, PA-C Introduced myself to the patient as a PA-C and provided education on APPs in clinical practice.         Subjective  Chief Complaint  Chief Complaint  Patient presents with   Hospitalization Follow-up    Pt states he was hospitalized for a total of 5 days at [facility], discharged yesterday. States he had a DVT and some type of infection, discharged on Tessalon and Keflex. States he is having some lower R leg pain, especially since leaving the hospital and being up and moving    HPI   Pt was hospitalized for sepsis and there is cocnern for not knowing source of infection IVC fliter was evaluated as source but surgeon did not think this is source Reports he had a blood clot in liver  He was given Kelfex for cellultis and blood thinners for clot prophylaxis  Reports he was not taking Xarelto with food prior to hospitalization  Reports redness and swelling in right calf - [place] ruled out DVT  States he is having pain in right calf from cellulitis and is taking Tylenol   [facility] Follow up.   Hospital/Facility:[facility]  D/C Physician: Sidney Ace, MD  D/C Date: 04/11/22  Records Requested:  Records Received: Discharge summary  Records Reviewed: Discharge summary, labs   Diagnoses on Discharge: Sepsis, Acute respiratory failure, Acute PE, Recurrent VTE   Date of interactive Contact within 48 hours of discharge: Apt within 24 hours  Contact was through: direct  Date of 7 day or 14 day face-to-face visit:    within 7 days  Outpatient Encounter Medications as of 04/12/2022  Medication Sig Note   albuterol (VENTOLIN HFA) 108 (90 Base) MCG/ACT inhaler Inhale 2 puffs into the lungs every 6 (six) hours as needed for wheezing or shortness of breath.    benzonatate (TESSALON) 200 MG capsule Take 1 capsule (200 mg  total) by mouth 3 (three) times daily.    cephALEXin (KEFLEX) 500 MG capsule Take 1 capsule (500 mg total) by mouth every 6 (six) hours for 3 days.    empagliflozin (JARDIANCE) 25 MG TABS tablet Take 1 tablet (25 mg total) by mouth daily before breakfast. 04/08/2022:   01/16/2022 25 MG TABS (disp 30, 30d supply)     furosemide (LASIX) 40 MG tablet Take 1 tablet (40 mg total) by mouth daily.    losartan (COZAAR) 50 MG tablet Take 1 tablet (50 mg total) by mouth daily.    magnesium oxide (MAG-OX) 400 (240 Mg) MG tablet Take 400 mg by mouth daily.    metoprolol succinate (TOPROL-XL) 25 MG 24 hr tablet Take 1 tablet (25 mg total) by mouth daily.    RIVAROXABAN (XARELTO) VTE STARTER PACK (15 & 20 MG) Follow package directions: Take one 33m tablet by mouth twice a day. On day 22, switch to one 234mtablet once a day. Take with food.    tirzepatide ([facility] MG/0.5ML Pen Inject 2.5 mg into the skin once a week. (Patient not taking: Reported on 04/12/2022)    No facility-administered encounter medications on file as of 04/12/2022.    Diagnostic Tests Reviewed/Disposition: DG chest, Echo, [place] imaging lower bilateral, CT abdomen pelvis with contrast, CT angio chest PE, blood cultures,   Consults: none   Discharge Instructions  Disease/illness Education: appears that pt was instructed on importance of using Xarelto daily to prevent recurrent clots   Home Health/Community Services Discussions/Referrals:None   Establishment or re-establishment of referral orders for community resources:  Discussion with other health care providers: none   Assessment and Support of treatment regimen adherence:appears that pt was instructed on importance of using Xarelto daily to prevent recurrent clots   Appointments Coordinated with:   Education for self-management, independent living, and ADLs:    Patient Active Problem List   Diagnosis Date Noted   Cellulitis of right lower leg 04/12/2022    Sepsis ([place]) 04/06/2022   Community acquired bacterial pneumonia 02/20/2022   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 ([place]) 02/09/2022   Lymphadenopathy 02/09/2022   Physical deconditioning 01/27/2022   At risk for sleep apnea 01/27/2022   VTE (venous thromboembolism) 01/26/2022   Sepsis with acute hypoxic respiratory failure ([place])    Chronic diastolic CHF (congestive heart failure) ([place]) 01/04/2022   Acute respiratory failure with hypoxia, recurrent, in setting of previous treatment for pneumonia, relapsing/remitting fever, no previous work-up for malignant or autoimmune cause or unusual infection 01/01/2022   Severe sepsis ([place]) 01/01/2022   Clotting disorder ([place]) 10/19/2021   Angina at rest [facility]) 07/29/2021   Hypercholesterolemia 07/29/2021   Cellulitis of right foot 06/03/2021   Atrial fibrillation with rapid ventricular response ([place]) 10/14/2020   Atrial flutter with rapid ventricular response ([place]) 10/01/2020   Knee pain 03/11/2020   Weakness of both lower limbs 11/17/2017   Diabetes mellitus without complication ([place]) 87/56/4332   CKD (chronic kidney disease), stage IIIa 08/03/2017   Hypertension 12/15/2016   Fatigue 12/15/2016   PVC (premature ventricular contraction) 11/24/2016   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   History of pulmonary embolism and DVT 12/28/2014   Hepatic steatosis 05/30/2013   Morbid obesity with BMI of 50.0-59.9, adult ([place]) 05/30/2013   Proteinuria 05/30/2013    Past Surgical History:  Procedure Laterality Date   CARDIAC ELECTROPHYSIOLOGY STUDY AND ABLATION  10/2020   PERIPHERAL VASCULAR CATHETERIZATION N/A 01/10/2015   Procedure: Dialysis/Perma Catheter Insertion;  Surgeon: Katha Cabal, MD;  Location: [place] [facility];  Service: Cardiovascular;  Laterality: N/A;   PERIPHERAL VASCULAR CATHETERIZATION N/A 02/24/2015   Procedure: Dialysis/Perma Catheter Removal;  Surgeon: Algernon Huxley, MD;  Location: [place] [facility];   Service: Cardiovascular;  Laterality: N/A;    Family History  Problem Relation Age of Onset   Hypertension Mother    Cancer Mother    Breast cancer Mother    Prostate cancer Father    Hypertension Father    Diabetes Father    Pulmonary embolism Paternal Grandfather    Pulmonary embolism Paternal Uncle     Social History   Tobacco Use   Smoking status: Never   Smokeless tobacco: Never  Substance Use Topics   Alcohol use: No     Current Outpatient Medications:    albuterol (VENTOLIN HFA) 108 (90 Base) MCG/ACT inhaler, Inhale 2 puffs into the lungs every 6 (six) hours as needed for wheezing or shortness of breath., Disp: 8 g, Rfl: 2   benzonatate (TESSALON) 200 MG capsule, Take 1 capsule (200 mg total) by mouth 3 (three) times daily., Disp: 20 capsule, Rfl: 0   cephALEXin (KEFLEX) 500 MG capsule, Take 1 capsule (500 mg total) by mouth every 6 (six) hours for 3 days., Disp: 12 capsule, Rfl: 0   empagliflozin (JARDIANCE) 25 MG TABS tablet, Take 1 tablet (25 mg total) by mouth  daily before breakfast., Disp: 90 tablet, Rfl: 1   furosemide (LASIX) 40 MG tablet, Take 1 tablet (40 mg total) by mouth daily., Disp: 90 tablet, Rfl: 0   losartan (COZAAR) 50 MG tablet, Take 1 tablet (50 mg total) by mouth daily., Disp: 90 tablet, Rfl: 1   magnesium oxide (MAG-OX) 400 (240 Mg) MG tablet, Take 400 mg by mouth daily., Disp: , Rfl:    metoprolol succinate (TOPROL-XL) 25 MG 24 hr tablet, Take 1 tablet (25 mg total) by mouth daily., Disp: , Rfl:    RIVAROXABAN (XARELTO) VTE STARTER PACK (15 & 20 MG), Follow package directions: Take one 69m tablet by mouth twice a day. On day 22, switch to one 258mtablet once a day. Take with food., Disp: 51 each, Rfl: 0   tirzepatide (MOUNJARO) 2.5 MG/0.5ML Pen, Inject 2.5 mg into the skin once a week. (Patient not taking: Reported on 04/12/2022), Disp: 2 mL, Rfl: 0  Allergies  Allergen Reactions   Metrizamide Other (See Comments)    Kidney failure requiring  dialysis Kidney failure requiring dialysis    Clindamycin/Lincomycin Rash   Iodinated Contrast Media Other (See Comments)    H/o kidney failure so was told not to receive contrast. NOT ALLERGY.    Lincomycin Rash   Sulfa Antibiotics Rash    I personally reviewed active problem list, medication list, allergies, notes from last encounter, notes from last 4 encounters, lab results, imaging with the patient/caregiver today.   Review of Systems  Respiratory:  Negative for shortness of breath and wheezing.   Cardiovascular:  Negative for chest pain, palpitations and leg swelling.  Musculoskeletal:  Positive for myalgias.  Neurological:  Negative for dizziness and headaches.      Objective  Vitals:   04/12/22 1424 04/12/22 1430  BP: (!) 142/87 127/81  Pulse: (!) 103 99  Temp: 99 F (37.2 C)   TempSrc: Oral   SpO2: 92%   Weight: (!) 351 lb 6.4 oz (159.4 kg)     Body mass index is 55.04 kg/m.  Physical Exam Vitals reviewed.  Constitutional:      General: He is awake.     Appearance: Normal appearance. He is well-developed and well-groomed. He is morbidly obese.  HENT:     Head: Normocephalic and atraumatic.  Neurological:     Mental Status: He is alert.  Psychiatric:        Behavior: Behavior is cooperative.      Recent Results (from the past 2160 hour(s))  Resp Panel by RT-PCR (Flu A&B, Covid) Anterior Nasal Swab     Status: None   Collection Time: 01/25/22  9:45 PM   Specimen: Anterior Nasal Swab  Result Value Ref Range   SARS Coronavirus 2 by RT PCR NEGATIVE NEGATIVE    Comment: (NOTE) SARS-CoV-2 target nucleic acids are NOT DETECTED.  The SARS-CoV-2 RNA is generally detectable in upper respiratory specimens during the acute phase of infection. The lowest concentration of SARS-CoV-2 viral copies this assay can detect is 138 copies/mL. A negative result does not preclude SARS-Cov-2 infection and should not be used as the sole basis for treatment or other  patient management decisions. A negative result may occur with  improper specimen collection/handling, submission of specimen other than nasopharyngeal swab, presence of viral mutation(s) within the areas targeted by this assay, and inadequate number of viral copies(<138 copies/mL). A negative result must be combined with clinical observations, patient history, and epidemiological information. The expected result is Negative.  Fact Sheet for  Patients:  EntrepreneurPulse.com.au  Fact Sheet for Healthcare Providers:  IncredibleEmployment.be  This test is no t yet approved or cleared by the [place] FDA and  has been authorized for detection and/or diagnosis of SARS-CoV-2 by FDA under an Emergency Use Authorization (EUA). This EUA will remain  in effect (meaning this test can be used) for the duration of the COVID-19 declaration under Section 564(b)(1) of the Act, 21 U.S.C.section 360bbb-3(b)(1), unless the authorization is terminated  or revoked sooner.       Influenza A by PCR NEGATIVE NEGATIVE   Influenza B by PCR NEGATIVE NEGATIVE    Comment: (NOTE) The Xpert Xpress SARS-CoV-2/FLU/RSV plus assay is intended as an aid in the diagnosis of influenza from Nasopharyngeal swab specimens and should not be used as a sole basis for treatment. Nasal washings and aspirates are unacceptable for Xpert Xpress SARS-CoV-2/FLU/RSV testing.  Fact Sheet for Patients: EntrepreneurPulse.com.au  Fact Sheet for Healthcare Providers: IncredibleEmployment.be  This test is not yet approved or cleared by the [place] FDA and has been authorized for detection and/or diagnosis of SARS-CoV-2 by FDA under an Emergency Use Authorization (EUA). This EUA will remain in effect (meaning this test can be used) for the duration of the COVID-19 declaration under Section 564(b)(1) of the Act, 21 U.S.C. section 360bbb-3(b)(1), unless  the authorization is terminated or revoked.  Performed at [facility], [address] [place], [place] [address]   Lactic acid, plasma     Status: Abnormal   Collection Time: 01/25/22  9:45 PM  Result Value Ref Range   Lactic Acid, Venous 2.2 (HH) 0.5 - 1.9 mmol/L    Comment: CRITICAL RESULT CALLED TO, READ BACK BY AND VERIFIED WITH CHRISTINA CARTER AT 2248 ON 01/25/22 BY SS Performed at [facility], [address] [place], [place] [address]   Comprehensive metabolic panel     Status: Abnormal   Collection Time: 01/25/22  9:45 PM  Result Value Ref Range   Sodium 138 135 - 145 mmol/L   Potassium 4.1 3.5 - 5.1 mmol/L   Chloride 104 98 - 111 mmol/L   CO2 24 22 - 32 mmol/L   Glucose, Bld 116 (H) 70 - 99 mg/dL    Comment: Glucose reference range applies only to samples taken after fasting for at least 8 hours.   BUN 12 6 - 20 mg/dL   Creatinine, Ser 1.30 (H) 0.61 - 1.24 mg/dL   Calcium 8.4 (L) 8.9 - 10.3 mg/dL   Total Protein 8.0 6.5 - 8.1 g/dL   Albumin 3.8 3.5 - 5.0 g/dL   AST 22 15 - 41 U/L   ALT 25 0 - 44 U/L   Alkaline Phosphatase 58 38 - 126 U/L   Total Bilirubin 0.8 0.3 - 1.2 mg/dL   GFR, Estimated >60 >60 mL/min    Comment: (NOTE) Calculated using the CKD-EPI Creatinine Equation (2021)    Anion gap 10 5 - 15    Comment: Performed at [facility], [address] [place], [place] [address]  CBC with Differential     Status: None   Collection Time: 01/25/22  9:45 PM  Result Value Ref Range   WBC 8.4 4.0 - 10.5 K/uL   RBC 4.85 4.22 - 5.81 MIL/uL   Hemoglobin 14.4 13.0 - 17.0 g/dL   HCT 45.6 39.0 - 52.0 %   MCV 94.0 80.0 - 100.0 fL   MCH 29.7 26.0 - 34.0 pg   MCHC 31.6 30.0 - 36.0 g/dL  RDW 14.8 11.5 - 15.5 %   Platelets 204 150 - 400 K/uL   nRBC 0.0 0.0 - 0.2 %   Neutrophils Relative % 74 %   Neutro Abs 6.2 1.7 - 7.7 K/uL   Lymphocytes Relative 14 %   Lymphs Abs 1.2 0.7 - 4.0 K/uL   Monocytes Relative 10 %   Monocytes Absolute  0.8 0.1 - 1.0 K/uL   Eosinophils Relative 2 %   Eosinophils Absolute 0.1 0.0 - 0.5 K/uL   Basophils Relative 0 %   Basophils Absolute 0.0 0.0 - 0.1 K/uL   Immature Granulocytes 0 %   Abs Immature Granulocytes 0.03 0.00 - 0.07 K/uL    Comment: Performed at [facility], [address] [place], [place] [address]  Protime-INR     Status: Abnormal   Collection Time: 01/25/22  9:45 PM  Result Value Ref Range   Prothrombin Time 16.6 (H) 11.4 - 15.2 seconds   INR 1.4 (H) 0.8 - 1.2    Comment: (NOTE) INR goal varies based on device and disease states. Performed at [facility], [address] [place], [place] [address]   APTT     Status: None   Collection Time: 01/25/22  9:45 PM  Result Value Ref Range   aPTT 27 24 - 36 seconds    Comment: Performed at [facility], [address] [place], [place] [address]  Brain natriuretic peptide     Status: None   Collection Time: 01/25/22  9:45 PM  Result Value Ref Range   B Natriuretic Peptide 35.5 0.0 - 100.0 pg/mL    Comment: Performed at [facility], [place], [place] [address]  Troponin I (High Sensitivity)     Status: Abnormal   Collection Time: 01/25/22  9:45 PM  Result Value Ref Range   Troponin I (High Sensitivity) 34 (H) <18 ng/L    Comment: (NOTE) Elevated high sensitivity troponin I (hsTnI) values and significant  changes across serial measurements may suggest ACS but many other  chronic and acute conditions are known to elevate hsTnI results.  Refer to the "Links" section for chest pain algorithms and additional  guidance. Performed at [facility], [address] [place], [place] [address]   Procalcitonin     Status: None   Collection Time: 01/25/22  9:45 PM  Result Value Ref Range   Procalcitonin 0.10 ng/mL    Comment:        Interpretation: PCT (Procalcitonin) <= 0.5 ng/mL: Systemic infection (sepsis) is not likely. Local bacterial infection is  possible. (NOTE)       Sepsis PCT Algorithm           Lower Respiratory Tract                                      Infection PCT Algorithm    ----------------------------     ----------------------------         PCT < 0.25 ng/mL                PCT < 0.10 ng/mL          Strongly encourage             Strongly discourage   discontinuation of antibiotics    initiation of antibiotics    ----------------------------     -----------------------------       PCT 0.25 -  0.50 ng/mL            PCT 0.10 - 0.25 ng/mL               OR       >80% decrease in PCT            Discourage initiation of                                            antibiotics      Encourage discontinuation           of antibiotics    ----------------------------     -----------------------------         PCT >= 0.50 ng/mL              PCT 0.26 - 0.50 ng/mL               AND        <80% decrease in PCT             Encourage initiation of                                             antibiotics       Encourage continuation           of antibiotics    ----------------------------     -----------------------------        PCT >= 0.50 ng/mL                  PCT > 0.50 ng/mL               AND         increase in PCT                  Strongly encourage                                      initiation of antibiotics    Strongly encourage escalation           of antibiotics                                     -----------------------------                                           PCT <= 0.25 ng/mL                                                 OR                                        > 80% decrease in PCT  Discontinue / Do not initiate                                             antibiotics  Performed at [facility], [address] [place], [place] [address]   Blood Culture (routine x 2)     Status: None   Collection Time: 01/25/22  9:55 PM   Specimen: BLOOD  Result Value Ref  Range   Specimen Description BLOOD RIGHT FOREARM    Special Requests      BOTTLES DRAWN AEROBIC AND ANAEROBIC Blood Culture adequate volume   Culture      NO GROWTH 5 DAYS Performed at [facility], [address] [place], [place] [address]    Report Status 01/30/2022 FINAL   Urinalysis, Complete w Microscopic Anterior Nasal Swab     Status: Abnormal   Collection Time: 01/25/22 10:00 PM  Result Value Ref Range   Color, Urine STRAW (A) YELLOW   APPearance CLEAR (A) CLEAR   Specific Gravity, Urine 1.015 1.005 - 1.030   pH 6.0 5.0 - 8.0   Glucose, UA >=500 (A) NEGATIVE mg/dL   Hgb urine dipstick SMALL (A) NEGATIVE   Bilirubin Urine NEGATIVE NEGATIVE   Ketones, ur NEGATIVE NEGATIVE mg/dL   Protein, ur 30 (A) NEGATIVE mg/dL   Nitrite NEGATIVE NEGATIVE   Leukocytes,Ua NEGATIVE NEGATIVE   RBC / HPF 0-5 0 - 5 RBC/hpf   WBC, UA 0-5 0 - 5 WBC/hpf   Bacteria, UA NONE SEEN NONE SEEN   Squamous Epithelial / LPF 0-5 0 - 5    Comment: Performed at [facility], [address], [place], [place] [address]  Urine Culture     Status: Abnormal   Collection Time: 01/25/22 10:00 PM   Specimen: Urine, Random  Result Value Ref Range   Specimen Description      URINE, RANDOM Performed at [facility], [address] [place], [place] [address]    Special Requests      NONE Performed at [facility], [address] [place], [place] [address]    Culture (A)     30,000 COLONIES/mL MULTIPLE SPECIES PRESENT, SUGGEST RECOLLECTION   Report Status 01/27/2022 FINAL   Blood Culture (routine x 2)     Status: None   Collection Time: 01/25/22 10:15 PM   Specimen: BLOOD  Result Value Ref Range   Specimen Description BLOOD RIGHT ARM    Special Requests      BOTTLES DRAWN AEROBIC AND ANAEROBIC Blood Culture results may not be optimal due to an inadequate volume of blood received in culture bottles   Culture      NO GROWTH 5 DAYS Performed at [facility],  [address] [place],  [address]    Report Status 01/30/2022 FINAL   Lactic acid, plasma     Status: Abnormal   Collection Time: 01/26/22 12:45 AM  Result Value Ref Range   Lactic Acid, Venous 2.1 (HH) 0.5 - 1.9 mmol/L    Comment: CRITICAL VALUE NOTED. VALUE IS CONSISTENT WITH PREVIOUSLY REPORTED/CALLED VALUE RH Performed at [facility], [address] [place],  [address]   Troponin I (High Sensitivity)     Status: Abnormal   Collection Time: 01/26/22  1:41 AM  Result Value Ref Range   Troponin I (High Sensitivity) 41 (H) <18 ng/L  Comment: (NOTE) Elevated high sensitivity troponin I (hsTnI) values and significant  changes across serial measurements may suggest ACS but many other  chronic and acute conditions are known to elevate hsTnI results.  Refer to the "Links" section for chest pain algorithms and additional  guidance. Performed at [facility], [place], [place] [address]   Heparin level (unfractionated)     Status: Abnormal   Collection Time: 01/26/22  1:41 AM  Result Value Ref Range   Heparin Unfractionated >1.10 (H) 0.30 - 0.70 IU/mL    Comment: (NOTE) The clinical reportable range upper limit is being lowered to >1.10 to align with the FDA approved guidance for the current laboratory assay.  If heparin results are below expected values, and patient dosage has  been confirmed, suggest follow up testing of antithrombin III levels. Performed at [facility], [address] [place], [place] [address]   Protime-INR     Status: Abnormal   Collection Time: 01/26/22  1:41 AM  Result Value Ref Range   Prothrombin Time 17.8 (H) 11.4 - 15.2 seconds   INR 1.5 (H) 0.8 - 1.2    Comment: (NOTE) INR goal varies based on device and disease states. Performed at [facility], [address] [place], [place] [address]   APTT     Status: Abnormal   Collection Time: 01/26/22  1:41 AM  Result Value Ref  Range   aPTT 83 (H) 24 - 36 seconds    Comment:        IF BASELINE aPTT IS ELEVATED, SUGGEST PATIENT RISK ASSESSMENT BE USED TO DETERMINE APPROPRIATE ANTICOAGULANT THERAPY. Performed at [facility], [address] [place], [place] [address]   Lactic acid, plasma     Status: None   Collection Time: 01/26/22  6:20 AM  Result Value Ref Range   Lactic Acid, Venous 1.4 0.5 - 1.9 mmol/L    Comment: Performed at [facility], [address] [place], [place] [address]  Magnesium     Status: None   Collection Time: 01/26/22  6:20 AM  Result Value Ref Range   Magnesium 1.7 1.7 - 2.4 mg/dL    Comment: Performed at [facility], [address] [place], [place] [address]  Basic metabolic panel     Status: Abnormal   Collection Time: 01/26/22  6:20 AM  Result Value Ref Range   Sodium 136 135 - 145 mmol/L   Potassium 4.2 3.5 - 5.1 mmol/L   Chloride 103 98 - 111 mmol/L   CO2 23 22 - 32 mmol/L   Glucose, Bld 118 (H) 70 - 99 mg/dL    Comment: Glucose reference range applies only to samples taken after fasting for at least 8 hours.   BUN 12 6 - 20 mg/dL   Creatinine, Ser 1.60 (H) 0.61 - 1.24 mg/dL   Calcium 8.6 (L) 8.9 - 10.3 mg/dL   GFR, Estimated 52 (L) >60 mL/min    Comment: (NOTE) Calculated using the CKD-EPI Creatinine Equation (2021)    Anion gap 10 5 - 15    Comment: Performed at [facility], [address] [place], [place] [address]  APTT     Status: Abnormal   Collection Time: 01/26/22  6:20 AM  Result Value Ref Range   aPTT 45 (H) 24 - 36 seconds    Comment:        IF BASELINE aPTT IS ELEVATED, SUGGEST PATIENT RISK ASSESSMENT BE USED TO DETERMINE APPROPRIATE ANTICOAGULANT  THERAPY. Performed at [facility], [place], [place] [address]   Heparin level (unfractionated)     Status: Abnormal   Collection Time: 01/26/22  6:20 AM  Result Value Ref Range   Heparin Unfractionated >1.10 (H) 0.30 - 0.70 IU/mL     Comment: (NOTE) The clinical reportable range upper limit is being lowered to >1.10 to align with the FDA approved guidance for the current laboratory assay.  If heparin results are below expected values, and patient dosage has  been confirmed, suggest follow up testing of antithrombin III levels. Performed at [facility], [address] [place], [place] [address]   Hemoglobin A1c     Status: Abnormal   Collection Time: 01/26/22  6:20 AM  Result Value Ref Range   Hgb A1c MFr Bld 6.2 (H) 4.8 - 5.6 %    Comment: (NOTE) Pre diabetes:          5.7%-6.4%  Diabetes:              >6.4%  Glycemic control for   <7.0% adults with diabetes    Mean Plasma Glucose 131.24 mg/dL    Comment: Performed at [facility] [address] [place], [place] [address]  Respiratory (~20 pathogens) panel by PCR     Status: None   Collection Time: 01/26/22  9:51 AM   Specimen: Nasopharyngeal Swab; Respiratory  Result Value Ref Range   Adenovirus NOT DETECTED NOT DETECTED   Coronavirus 229E NOT DETECTED NOT DETECTED    Comment: (NOTE) The Coronavirus on the Respiratory Panel, DOES NOT test for the novel  Coronavirus (2019 nCoV)    Coronavirus HKU1 NOT DETECTED NOT DETECTED   Coronavirus NL63 NOT DETECTED NOT DETECTED   Coronavirus OC43 NOT DETECTED NOT DETECTED   Metapneumovirus NOT DETECTED NOT DETECTED   Rhinovirus / Enterovirus NOT DETECTED NOT DETECTED   Influenza A NOT DETECTED NOT DETECTED   Influenza B NOT DETECTED NOT DETECTED   Parainfluenza Virus 1 NOT DETECTED NOT DETECTED   Parainfluenza Virus 2 NOT DETECTED NOT DETECTED   Parainfluenza Virus 3 NOT DETECTED NOT DETECTED   Parainfluenza Virus 4 NOT DETECTED NOT DETECTED   Respiratory Syncytial Virus NOT DETECTED NOT DETECTED   Bordetella pertussis NOT DETECTED NOT DETECTED   Bordetella Parapertussis NOT DETECTED NOT DETECTED   Chlamydophila pneumoniae NOT DETECTED NOT DETECTED   Mycoplasma pneumoniae NOT DETECTED NOT  DETECTED    Comment: Performed at [facility], [place] [address] [place], [place] [address]  CBG monitoring, ED     Status: None   Collection Time: 01/26/22 12:16 PM  Result Value Ref Range   Glucose-Capillary 99 70 - 99 mg/dL    Comment: Glucose reference range applies only to samples taken after fasting for at least 8 hours.  APTT     Status: Abnormal   Collection Time: 01/26/22  2:00 PM  Result Value Ref Range   aPTT 66 (H) 24 - 36 seconds    Comment:        IF BASELINE aPTT IS ELEVATED, SUGGEST PATIENT RISK ASSESSMENT BE USED TO DETERMINE APPROPRIATE ANTICOAGULANT THERAPY. Performed at [facility], [address] [place], [place] [address]   Glucose, capillary     Status: Abnormal   Collection Time: 01/26/22  4:23 PM  Result Value Ref Range   Glucose-Capillary 130 (H) 70 - 99 mg/dL    Comment: Glucose reference range applies only to samples taken after fasting for at  least 8 hours.  APTT     Status: Abnormal   Collection Time: 01/26/22  7:47 PM  Result Value Ref Range   aPTT 58 (H) 24 - 36 seconds    Comment:        IF BASELINE aPTT IS ELEVATED, SUGGEST PATIENT RISK ASSESSMENT BE USED TO DETERMINE APPROPRIATE ANTICOAGULANT THERAPY. Performed at [facility], [address] [place], [place] [address]   Basic metabolic panel     Status: Abnormal   Collection Time: 01/27/22  5:15 AM  Result Value Ref Range   Sodium 134 (L) 135 - 145 mmol/L   Potassium 4.0 3.5 - 5.1 mmol/L   Chloride 104 98 - 111 mmol/L   CO2 19 (L) 22 - 32 mmol/L   Glucose, Bld 111 (H) 70 - 99 mg/dL    Comment: Glucose reference range applies only to samples taken after fasting for at least 8 hours.   BUN 13 6 - 20 mg/dL   Creatinine, Ser 1.39 (H) 0.61 - 1.24 mg/dL   Calcium 8.2 (L) 8.9 - 10.3 mg/dL   GFR, Estimated >60 >60 mL/min    Comment: (NOTE) Calculated using the CKD-EPI Creatinine Equation (2021)    Anion gap 11 5 - 15    Comment: Performed at [facility], [address] [place], [place] [address]  Phosphorus     Status: None   Collection Time: 01/27/22  5:15 AM  Result Value Ref Range   Phosphorus 3.1 2.5 - 4.6 mg/dL    Comment: Performed at [facility], [address] [place], [place] [address]  Magnesium     Status: None   Collection Time: 01/27/22  5:15 AM  Result Value Ref Range   Magnesium 2.2 1.7 - 2.4 mg/dL    Comment: Performed at [facility], [address] [place], [place] [address]  CBC     Status: None   Collection Time: 01/27/22  5:15 AM  Result Value Ref Range   WBC 5.4 4.0 - 10.5 K/uL   RBC 4.81 4.22 - 5.81 MIL/uL   Hemoglobin 14.2 13.0 - 17.0 g/dL   HCT 45.6 39.0 - 52.0 %   MCV 94.8 80.0 - 100.0 fL   MCH 29.5 26.0 - 34.0 pg   MCHC 31.1 30.0 - 36.0 g/dL   RDW 15.2 11.5 - 15.5 %   Platelets 166 150 - 400 K/uL   nRBC 0.0 0.0 - 0.2 %    Comment: Performed at [facility], [place], [place] [address]  Procalcitonin - Baseline     Status: None   Collection Time: 01/27/22  5:15 AM  Result Value Ref Range   Procalcitonin 0.95 ng/mL    Comment:        Interpretation: PCT > 0.5 ng/mL and <= 2 ng/mL: Systemic infection (sepsis) is possible, but other conditions are known to elevate PCT as well. (NOTE)       Sepsis PCT Algorithm           Lower Respiratory Tract                                      Infection PCT Algorithm    ----------------------------     ----------------------------         PCT < 0.25 ng/mL  PCT < 0.10 ng/mL          Strongly encourage             Strongly discourage   discontinuation of antibiotics    initiation of antibiotics    ----------------------------     -----------------------------       PCT 0.25 - 0.50 ng/mL            PCT 0.10 - 0.25 ng/mL               OR       >80% decrease in PCT            Discourage initiation of                                            antibiotics      Encourage discontinuation            of antibiotics    ----------------------------     -----------------------------         PCT >= 0.50 ng/mL              PCT 0.26 - 0.50 ng/mL                AND       <80% decrease in PCT             Encourage initiation of                                             antibiotics       Encourage continuation           of antibiotics    ----------------------------     -----------------------------        PCT >= 0.50 ng/mL                  PCT > 0.50 ng/mL               AND         increase in PCT                  Strongly encourage                                      initiation of antibiotics    Strongly encourage escalation           of antibiotics                                     -----------------------------                                           PCT <= 0.25 ng/mL                                                 OR                                        >  80% decrease in PCT                                      Discontinue / Do not initiate                                             antibiotics  Performed at [facility], [place], [place] [address]   Heparin level (unfractionated)     Status: None   Collection Time: 01/27/22  6:04 AM  Result Value Ref Range   Heparin Unfractionated 0.56 0.30 - 0.70 IU/mL    Comment: (NOTE) The clinical reportable range upper limit is being lowered to >1.10 to align with the FDA approved guidance for the current laboratory assay.  If heparin results are below expected values, and patient dosage has  been confirmed, suggest follow up testing of antithrombin III levels. Performed at [facility], [address] [place], [place] [address]   APTT     Status: Abnormal   Collection Time: 01/27/22  6:04 AM  Result Value Ref Range   aPTT 70 (H) 24 - 36 seconds    Comment:        IF BASELINE aPTT IS ELEVATED, SUGGEST PATIENT RISK ASSESSMENT BE USED TO DETERMINE APPROPRIATE ANTICOAGULANT  THERAPY. Performed at [facility], [address] [place], [place] [address]   Glucose, capillary     Status: Abnormal   Collection Time: 01/27/22  9:18 AM  Result Value Ref Range   Glucose-Capillary 150 (H) 70 - 99 mg/dL    Comment: Glucose reference range applies only to samples taken after fasting for at least 8 hours.  MRSA Next Gen by PCR, Nasal     Status: None   Collection Time: 01/27/22 10:34 AM  Result Value Ref Range   MRSA by PCR Next Gen NOT DETECTED NOT DETECTED    Comment: (NOTE) The GeneXpert MRSA Assay (FDA approved for NASAL specimens only), is one component of a comprehensive MRSA colonization surveillance program. It is not intended to diagnose MRSA infection nor to guide or monitor treatment for MRSA infections. Test performance is not FDA approved in patients less than 38 years old. Performed at [facility], [address] [place], [place] [address]   Glucose, capillary     Status: Abnormal   Collection Time: 01/27/22 11:25 AM  Result Value Ref Range   Glucose-Capillary 148 (H) 70 - 99 mg/dL    Comment: Glucose reference range applies only to samples taken after fasting for at least 8 hours.  APTT     Status: Abnormal   Collection Time: 01/27/22 12:28 PM  Result Value Ref Range   aPTT 47 (H) 24 - 36 seconds    Comment:        IF BASELINE aPTT IS ELEVATED, SUGGEST PATIENT RISK ASSESSMENT BE USED TO DETERMINE APPROPRIATE ANTICOAGULANT THERAPY. Performed at [facility], [place], [place] [address]   Heparin level (unfractionated)     Status: None   Collection Time: 01/27/22 12:28 PM  Result Value Ref Range   Heparin Unfractionated 0.37 0.30 - 0.70 IU/mL    Comment: (NOTE) The clinical reportable range upper limit is being lowered to >1.10 to align with the FDA approved guidance for the  current laboratory assay.  If heparin results are below expected values, and patient dosage has  been confirmed,  suggest follow up testing of antithrombin III levels. Performed at [facility], [address] [place], [place] [address]   Glucose, capillary     Status: Abnormal   Collection Time: 01/27/22  4:53 PM  Result Value Ref Range   Glucose-Capillary 117 (H) 70 - 99 mg/dL    Comment: Glucose reference range applies only to samples taken after fasting for at least 8 hours.  Heparin level (unfractionated)     Status: None   Collection Time: 01/27/22  8:12 PM  Result Value Ref Range   Heparin Unfractionated 0.51 0.30 - 0.70 IU/mL    Comment: (NOTE) The clinical reportable range upper limit is being lowered to >1.10 to align with the FDA approved guidance for the current laboratory assay.  If heparin results are below expected values, and patient dosage has  been confirmed, suggest follow up testing of antithrombin III levels. Performed at [facility], [address] [place], [place] [address]   APTT     Status: Abnormal   Collection Time: 01/27/22  8:12 PM  Result Value Ref Range   aPTT 66 (H) 24 - 36 seconds    Comment:        IF BASELINE aPTT IS ELEVATED, SUGGEST PATIENT RISK ASSESSMENT BE USED TO DETERMINE APPROPRIATE ANTICOAGULANT THERAPY. Performed at [facility], [address] [place], [place] [address]   Procalcitonin     Status: None   Collection Time: 01/28/22  2:21 AM  Result Value Ref Range   Procalcitonin 0.61 ng/mL    Comment:        Interpretation: PCT > 0.5 ng/mL and <= 2 ng/mL: Systemic infection (sepsis) is possible, but other conditions are known to elevate PCT as well. (NOTE)       Sepsis PCT Algorithm           Lower Respiratory Tract                                      Infection PCT Algorithm    ----------------------------     ----------------------------         PCT < 0.25 ng/mL                PCT < 0.10 ng/mL          Strongly encourage             Strongly discourage   discontinuation of antibiotics    initiation  of antibiotics    ----------------------------     -----------------------------       PCT 0.25 - 0.50 ng/mL            PCT 0.10 - 0.25 ng/mL               OR       >80% decrease in PCT            Discourage initiation of                                            antibiotics      Encourage discontinuation           of antibiotics    ----------------------------     -----------------------------  PCT >= 0.50 ng/mL              PCT 0.26 - 0.50 ng/mL                AND       <80% decrease in PCT             Encourage initiation of                                             antibiotics       Encourage continuation           of antibiotics    ----------------------------     -----------------------------        PCT >= 0.50 ng/mL                  PCT > 0.50 ng/mL               AND         increase in PCT                  Strongly encourage                                      initiation of antibiotics    Strongly encourage escalation           of antibiotics                                     -----------------------------                                           PCT <= 0.25 ng/mL                                                 OR                                        > 80% decrease in PCT                                      Discontinue / Do not initiate                                             antibiotics  Performed at [facility], [address] [place], [place] [address]   Brain natriuretic peptide     Status: None   Collection Time: 01/28/22  2:21 AM  Result Value Ref Range   B Natriuretic Peptide 14.6 0.0 - 100.0 pg/mL    Comment: Performed at [facility], [address], [place], [place] [address]  Renal  function panel     Status: Abnormal   Collection Time: 01/28/22  2:21 AM  Result Value Ref Range   Sodium 136 135 - 145 mmol/L   Potassium 3.8 3.5 - 5.1 mmol/L   Chloride 107 98 - 111 mmol/L   CO2 23 22 - 32 mmol/L   Glucose, Bld 122  (H) 70 - 99 mg/dL    Comment: Glucose reference range applies only to samples taken after fasting for at least 8 hours.   BUN 17 6 - 20 mg/dL   Creatinine, Ser 1.36 (H) 0.61 - 1.24 mg/dL   Calcium 8.3 (L) 8.9 - 10.3 mg/dL   Phosphorus 3.5 2.5 - 4.6 mg/dL   Albumin 3.3 (L) 3.5 - 5.0 g/dL   GFR, Estimated >60 >60 mL/min    Comment: (NOTE) Calculated using the CKD-EPI Creatinine Equation (2021)    Anion gap 6 5 - 15    Comment: Performed at [facility], [address] [place], [place] [address]  Magnesium     Status: None   Collection Time: 01/28/22  2:21 AM  Result Value Ref Range   Magnesium 2.2 1.7 - 2.4 mg/dL    Comment: Performed at [facility], [address] [place], [place] [address]  CBC     Status: None   Collection Time: 01/28/22  2:21 AM  Result Value Ref Range   WBC 4.8 4.0 - 10.5 K/uL   RBC 4.67 4.22 - 5.81 MIL/uL   Hemoglobin 13.8 13.0 - 17.0 g/dL   HCT 43.3 39.0 - 52.0 %   MCV 92.7 80.0 - 100.0 fL   MCH 29.6 26.0 - 34.0 pg   MCHC 31.9 30.0 - 36.0 g/dL   RDW 14.9 11.5 - 15.5 %   Platelets 180 150 - 400 K/uL   nRBC 0.0 0.0 - 0.2 %    Comment: Performed at [facility], [place], [place] [address]  Heparin level (unfractionated)     Status: None   Collection Time: 01/28/22  2:21 AM  Result Value Ref Range   Heparin Unfractionated 0.49 0.30 - 0.70 IU/mL    Comment: (NOTE) The clinical reportable range upper limit is being lowered to >1.10 to align with the FDA approved guidance for the current laboratory assay.  If heparin results are below expected values, and patient dosage has  been confirmed, suggest follow up testing of antithrombin III levels. Performed at [facility], [address] [place], [place] [address]   Glucose, capillary     Status: Abnormal   Collection Time: 01/28/22  8:39 AM  Result Value Ref Range   Glucose-Capillary 119 (H) 70 - 99 mg/dL    Comment: Glucose reference range  applies only to samples taken after fasting for at least 8 hours.  Glucose, capillary     Status: Abnormal   Collection Time: 01/28/22 11:11 AM  Result Value Ref Range   Glucose-Capillary 110 (H) 70 - 99 mg/dL    Comment: Glucose reference range applies only to samples taken after fasting for at least 8 hours.  Comp Met (CMET)     Status: Abnormal   Collection Time: 02/03/22  8:57 AM  Result Value Ref Range   Glucose 116 (H) 70 - 99 mg/dL   BUN 12 6 - 24 mg/dL   Creatinine, Ser 1.36 (H) 0.76 - 1.27 mg/dL   eGFR 63 >59 mL/min/1.73   BUN/Creatinine Ratio 9 9 - 20   Sodium 142 134 - 144  mmol/L   Potassium 4.4 3.5 - 5.2 mmol/L   Chloride 105 96 - 106 mmol/L   CO2 18 (L) 20 - 29 mmol/L   Calcium 9.3 8.7 - 10.2 mg/dL   Total Protein 8.0 6.0 - 8.5 g/dL   Albumin 4.2 3.8 - 4.9 g/dL   Globulin, Total 3.8 1.5 - 4.5 g/dL   Albumin/Globulin Ratio 1.1 (L) 1.2 - 2.2   Bilirubin Total 0.4 0.0 - 1.2 mg/dL   Alkaline Phosphatase 63 44 - 121 IU/L   AST 21 0 - 40 IU/L   ALT 27 0 - 44 IU/L  HgB A1c     Status: Abnormal   Collection Time: 02/03/22  8:57 AM  Result Value Ref Range   Hgb A1c MFr Bld 6.4 (H) 4.8 - 5.6 %    Comment:          Prediabetes: 5.7 - 6.4          Diabetes: >6.4          Glycemic control for adults with diabetes: <7.0    Est. average glucose Bld gHb Est-mCnc 137 mg/dL  CBC w/Diff     Status: None   Collection Time: 02/03/22  8:57 AM  Result Value Ref Range   WBC 4.0 3.4 - 10.8 x10E3/uL   RBC 4.95 4.14 - 5.80 x10E6/uL   Hemoglobin 14.8 13.0 - 17.7 g/dL   Hematocrit 45.1 37.5 - 51.0 %   MCV 91 79 - 97 fL   MCH 29.9 26.6 - 33.0 pg   MCHC 32.8 31.5 - 35.7 g/dL   RDW 13.8 11.6 - 15.4 %   Platelets 293 150 - 450 x10E3/uL   Neutrophils 35 Not Estab. %   Lymphs 43 Not Estab. %   Monocytes 12 Not Estab. %   Eos 8 Not Estab. %   Basos 1 Not Estab. %   Neutrophils Absolute 1.4 1.4 - 7.0 x10E3/uL   Lymphocytes Absolute 1.7 0.7 - 3.1 x10E3/uL   Monocytes Absolute 0.5 0.1 -  0.9 x10E3/uL   EOS (ABSOLUTE) 0.3 0.0 - 0.4 x10E3/uL   Basophils Absolute 0.0 0.0 - 0.2 x10E3/uL   Immature Granulocytes 1 Not Estab. %   Immature Grans (Abs) 0.0 0.0 - 0.1 x10E3/uL  Magnesium     Status: None   Collection Time: 02/03/22  8:57 AM  Result Value Ref Range   Magnesium 1.8 1.6 - 2.3 mg/dL    Comment: **Verified by repeat analysis**  Factor 5 leiden     Status: None   Collection Time: 02/17/22 11:30 AM  Result Value Ref Range   Recommendations-F5LEID: Comment     Comment: (NOTE) Result: c.1601G>A (p.Arg534Gln) - Not Detected This result is not associated with an increased risk for venous thromboembolism. See Additional Clinical Information and Comments. Additional Clinical Information: Venous thromboembolism is a multifactorial disease influenced by genetic, environmental, and circumstantial risk factors. The c.1601G>A (p. Arg534Gln) variant in the F5 gene, commonly referred to as Factor V Leiden, is a genetic risk factor for venous thromboembolism. Heterozygous carriers of this variant have a 6- to 8- fold increased risk for venous thromboembolism. Individuals homozygous for this variant (ie, with a copy of the variant on each chromosome) have an approximately 80-fold increased risk for venous thromboembolism. Individuals who carry both a c.*97G>A variant in the F2 gene and Factor V Leiden have an approximately 20-fold increased risk for venous thromboembolism. Risks are likely to be even higher in more complex genotype combinations in volving the F2 c.*97G>A  variant and Factor V Leiden (PMID: 02774128). Additional risk factors include but are not limited to: deficiency of protein C, protein S, or antithrombin III, age, male sex, personal or family history of deep vein thromboembolism, smoking, surgery, prolonged immobilization, malignant neoplasm, tamoxifen treatment, raloxifene treatment, oral contraceptive use, hormone replacement therapy, and pregnancy.  Management of thrombotic risk and thrombotic events should follow established guidelines and fit the clinical circumstance. This result cannot predict the occurrence or recurrence of a thrombotic event. Comment: Genetic counseling is recommended to discuss the potential clinical implications of positive results, as well as recommendations for testing family members. Genetic Coordinators are available for health care providers to discuss results at [phone]). Test Details: Variant Analyzed: c.1601G>A (p. Arg534Gln), referred to as Fact or V Leiden Methods/Limitations: DNA analysis of the F5 gene (NM_000130.5) was performed by PCR amplification followed by restriction enzyme analysis. The diagnostic sensitivity is >99%. Results must be combined with clinical information for the most accurate interpretation. Molecular- based testing is highly accurate, but as in any laboratory test, diagnostic errors may occur. False positive or false negative results may occur for reasons that include genetic variants, blood transfusions, bone marrow transplantation, somatic or tissue-specific mosaicism, mislabeled samples, or erroneous representation of family relationships. This test was developed and its performance characteristics determined by [facility]. It has not been cleared or approved by the Food and Drug Administration. References: [place] [facility], Laurann [place] [facility]; [facility] Professional Practice and Guidelines Committee. Addendum: [place] consensus statement on fac tor V Leiden mutation testing. Genet Med. 2021 Mar 5. doi: 67.2094/B09628-366- 01108-x. PMID: 29476546. Kristopher Oppenheim. Factor V Leiden Thrombophilia. 1999 May 14 (Updated 2018 Jan 4). In: Tarri Glenn, Ardinger HH, Pagon RA, et al., editors. GeneReviews(R) (Internet). [address] ([place]): [place] [place], [place]; 1993-2021. Available  from: [email or web address] Terrilee Files, Carla Drape, Marin Shutter CS; [facility] Laboratory Quality Assurance Committee. Venous thromboembolism laboratory testing (factor V Leiden and factor II c.*97G>A), 2018 update: a technical standard of the [place] ([facility]). Genet Med. 2018 Dec;20(12):1489-1498. doi: 50.3546/[id number]-C. Epub 2018 Oct 5. PMID: 94496759.    Reviewed By: Jane Canary, PhD     Comment: (NOTE) Performed At: [facility] [address] [place], [place] [id number] Katina Degree MDPhD DJ:[id number]   Urinalysis, Routine w reflex microscopic     Status: Abnormal   Collection Time: 02/20/22  1:54 PM  Result Value Ref Range   Color, Urine YELLOW (A) YELLOW   APPearance CLEAR (A) CLEAR   Specific Gravity, Urine 1.021 1.005 - 1.030   pH 5.0 5.0 - 8.0   Glucose, UA NEGATIVE NEGATIVE mg/dL   Hgb urine dipstick SMALL (A) NEGATIVE   Bilirubin Urine NEGATIVE NEGATIVE   Ketones, ur NEGATIVE NEGATIVE mg/dL   Protein, ur 100 (A) NEGATIVE mg/dL   Nitrite NEGATIVE NEGATIVE   Leukocytes,Ua NEGATIVE NEGATIVE   RBC / HPF 0-5 0 - 5 RBC/hpf   WBC, UA 0-5 0 - 5 WBC/hpf   Bacteria, UA NONE SEEN NONE SEEN   Squamous Epithelial / LPF 0-5 0 - 5   Mucus PRESENT     Comment: Performed at [facility], [address] [place], [place] [address]  Comprehensive metabolic panel     Status: Abnormal   Collection Time: 02/20/22  1:57 PM  Result Value Ref Range   Sodium 136 135 - 145 mmol/L   Potassium 3.9 3.5 -  5.1 mmol/L   Chloride 102 98 - 111 mmol/L   CO2 25 22 - 32 mmol/L   Glucose, Bld 123 (H) 70 - 99 mg/dL    Comment: Glucose reference range applies only to samples taken after fasting for at least 8 hours.   BUN 17 6 - 20 mg/dL   Creatinine, Ser 1.67 (H) 0.61 - 1.24 mg/dL   Calcium 9.3 8.9 - 10.3 mg/dL   Total Protein 8.7 (H) 6.5 - 8.1 g/dL   Albumin 4.1 3.5 - 5.0 g/dL   AST 20 15 -  41 U/L   ALT 24 0 - 44 U/L   Alkaline Phosphatase 59 38 - 126 U/L   Total Bilirubin 1.2 0.3 - 1.2 mg/dL   GFR, Estimated 49 (L) >60 mL/min    Comment: (NOTE) Calculated using the CKD-EPI Creatinine Equation (2021)    Anion gap 9 5 - 15    Comment: Performed at [facility], [address] [place], [place] [address]  Lactic acid, plasma     Status: None   Collection Time: 02/20/22  1:57 PM  Result Value Ref Range   Lactic Acid, Venous 1.6 0.5 - 1.9 mmol/L    Comment: Performed at [facility], [address] [place], [place] [address]  CBC with Differential     Status: None   Collection Time: 02/20/22  1:57 PM  Result Value Ref Range   WBC 8.8 4.0 - 10.5 K/uL   RBC 5.44 4.22 - 5.81 MIL/uL   Hemoglobin 15.9 13.0 - 17.0 g/dL   HCT 50.3 39.0 - 52.0 %   MCV 92.5 80.0 - 100.0 fL   MCH 29.2 26.0 - 34.0 pg   MCHC 31.6 30.0 - 36.0 g/dL   RDW 14.6 11.5 - 15.5 %   Platelets 270 150 - 400 K/uL   nRBC 0.0 0.0 - 0.2 %   Neutrophils Relative % 75 %   Neutro Abs 6.5 1.7 - 7.7 K/uL   Lymphocytes Relative 17 %   Lymphs Abs 1.5 0.7 - 4.0 K/uL   Monocytes Relative 8 %   Monocytes Absolute 0.7 0.1 - 1.0 K/uL   Eosinophils Relative 0 %   Eosinophils Absolute 0.0 0.0 - 0.5 K/uL   Basophils Relative 0 %   Basophils Absolute 0.0 0.0 - 0.1 K/uL   Immature Granulocytes 0 %   Abs Immature Granulocytes 0.02 0.00 - 0.07 K/uL    Comment: Performed at [facility], [address] [place], [place] [address]  Protime-INR     Status: Abnormal   Collection Time: 02/20/22  1:57 PM  Result Value Ref Range   Prothrombin Time 18.2 (H) 11.4 - 15.2 seconds   INR 1.5 (H) 0.8 - 1.2    Comment: (NOTE) INR goal varies based on device and disease states. Performed at [facility], [address] [place], [place] [address]   Culture, blood (Routine x 2)     Status: None   Collection Time: 02/20/22  1:57 PM   Specimen: BLOOD  Result Value Ref Range   Specimen  Description BLOOD LEFT ANTECUBITAL    Special Requests      BOTTLES DRAWN AEROBIC AND ANAEROBIC Blood Culture adequate volume   Culture      NO GROWTH 6 DAYS Performed at [facility], [address], [place], [place] [address]    Report Status 02/26/2022 FINAL   Culture, blood (Routine x 2)     Status: None   Collection  Time: 02/20/22  2:10 PM   Specimen: BLOOD  Result Value Ref Range   Specimen Description BLOOD BLOOD RIGHT FOREARM    Special Requests      BOTTLES DRAWN AEROBIC AND ANAEROBIC Blood Culture adequate volume   Culture      NO GROWTH 6 DAYS Performed at [facility], [address] [place],  [address]    Report Status 02/26/2022 FINAL   Procalcitonin - Baseline     Status: None   Collection Time: 02/20/22  2:44 PM  Result Value Ref Range   Procalcitonin 0.14 ng/mL    Comment:        Interpretation: PCT (Procalcitonin) <= 0.5 ng/mL: Systemic infection (sepsis) is not likely. Local bacterial infection is possible. (NOTE)       Sepsis PCT Algorithm           Lower Respiratory Tract                                      Infection PCT Algorithm    ----------------------------     ----------------------------         PCT < 0.25 ng/mL                PCT < 0.10 ng/mL          Strongly encourage             Strongly discourage   discontinuation of antibiotics    initiation of antibiotics    ----------------------------     -----------------------------       PCT 0.25 - 0.50 ng/mL            PCT 0.10 - 0.25 ng/mL               OR       >80% decrease in PCT            Discourage initiation of                                            antibiotics      Encourage discontinuation           of antibiotics    ----------------------------     -----------------------------         PCT >= 0.50 ng/mL              PCT 0.26 - 0.50 ng/mL               AND        <80% decrease in PCT             Encourage initiation of                                              antibiotics       Encourage continuation           of antibiotics    ----------------------------     -----------------------------        PCT >= 0.50 ng/mL                  PCT > 0.50 ng/mL  AND         increase in PCT                  Strongly encourage                                      initiation of antibiotics    Strongly encourage escalation           of antibiotics                                     -----------------------------                                           PCT <= 0.25 ng/mL                                                 OR                                        > 80% decrease in PCT                                      Discontinue / Do not initiate                                             antibiotics  Performed at [facility], [address] [place], [place] [address]   Brain natriuretic peptide     Status: None   Collection Time: 02/20/22  2:44 PM  Result Value Ref Range   B Natriuretic Peptide 94.4 0.0 - 100.0 pg/mL    Comment: Performed at [facility], [address] [place], [place] [address]  Respiratory (~20 pathogens) panel by PCR     Status: None   Collection Time: 02/20/22  4:29 PM   Specimen: Nasopharyngeal Swab; Respiratory  Result Value Ref Range   Adenovirus NOT DETECTED NOT DETECTED   Coronavirus 229E NOT DETECTED NOT DETECTED    Comment: (NOTE) The Coronavirus on the Respiratory Panel, DOES NOT test for the novel  Coronavirus (2019 nCoV)    Coronavirus HKU1 NOT DETECTED NOT DETECTED   Coronavirus NL63 NOT DETECTED NOT DETECTED   Coronavirus OC43 NOT DETECTED NOT DETECTED   Metapneumovirus NOT DETECTED NOT DETECTED   Rhinovirus / Enterovirus NOT DETECTED NOT DETECTED   Influenza A NOT DETECTED NOT DETECTED   Influenza B NOT DETECTED NOT DETECTED   Parainfluenza Virus 1 NOT DETECTED NOT DETECTED   Parainfluenza Virus 2 NOT DETECTED NOT DETECTED   Parainfluenza Virus 3 NOT DETECTED NOT  DETECTED   Parainfluenza Virus 4 NOT DETECTED NOT DETECTED   Respiratory Syncytial Virus NOT DETECTED NOT DETECTED   Bordetella pertussis NOT DETECTED NOT DETECTED   Bordetella Parapertussis NOT DETECTED  NOT DETECTED   Chlamydophila pneumoniae NOT DETECTED NOT DETECTED   Mycoplasma pneumoniae NOT DETECTED NOT DETECTED    Comment: Performed at [facility], [place] [address], [place], [place] [address]  Strep pneumoniae urinary antigen     Status: None   Collection Time: 02/20/22  4:29 PM  Result Value Ref Range   Strep Pneumo Urinary Antigen NEGATIVE NEGATIVE    Comment:        Infection due to S. pneumoniae cannot be absolutely ruled out since the antigen present may be below the detection limit of the test. Performed at [facility], [place] [address] [place], [place] [address]   Resp Panel by RT-PCR (Flu A&B, Covid) Anterior Nasal Swab     Status: None   Collection Time: 02/20/22  4:29 PM   Specimen: Anterior Nasal Swab  Result Value Ref Range   SARS Coronavirus 2 by RT PCR NEGATIVE NEGATIVE    Comment: (NOTE) SARS-CoV-2 target nucleic acids are NOT DETECTED.  The SARS-CoV-2 RNA is generally detectable in upper respiratory specimens during the acute phase of infection. The lowest concentration of SARS-CoV-2 viral copies this assay can detect is 138 copies/mL. A negative result does not preclude SARS-Cov-2 infection and should not be used as the sole basis for treatment or other patient management decisions. A negative result may occur with  improper specimen collection/handling, submission of specimen other than nasopharyngeal swab, presence of viral mutation(s) within the areas targeted by this assay, and inadequate number of viral copies(<138 copies/mL). A negative result must be combined with clinical observations, patient history, and epidemiological information. The expected result is Negative.  Fact Sheet for Patients:   EntrepreneurPulse.com.au  Fact Sheet for Healthcare Providers:  IncredibleEmployment.be  This test is no t yet approved or cleared by the [place] FDA and  has been authorized for detection and/or diagnosis of SARS-CoV-2 by FDA under an Emergency Use Authorization (EUA). This EUA will remain  in effect (meaning this test can be used) for the duration of the COVID-19 declaration under Section 564(b)(1) of the Act, 21 U.S.C.section 360bbb-3(b)(1), unless the authorization is terminated  or revoked sooner.       Influenza A by PCR NEGATIVE NEGATIVE   Influenza B by PCR NEGATIVE NEGATIVE    Comment: (NOTE) The Xpert Xpress SARS-CoV-2/FLU/RSV plus assay is intended as an aid in the diagnosis of influenza from Nasopharyngeal swab specimens and should not be used as a sole basis for treatment. Nasal washings and aspirates are unacceptable for Xpert Xpress SARS-CoV-2/FLU/RSV testing.  Fact Sheet for Patients: EntrepreneurPulse.com.au  Fact Sheet for Healthcare Providers: IncredibleEmployment.be  This test is not yet approved or cleared by the [place] FDA and has been authorized for detection and/or diagnosis of SARS-CoV-2 by FDA under an Emergency Use Authorization (EUA). This EUA will remain in effect (meaning this test can be used) for the duration of the COVID-19 declaration under Section 564(b)(1) of the Act, 21 U.S.C. section 360bbb-3(b)(1), unless the authorization is terminated or revoked.  Performed at [facility], [address] [place], [place] [address]   CBG monitoring, ED     Status: Abnormal   Collection Time: 02/20/22  4:46 PM  Result Value Ref Range   Glucose-Capillary 131 (H) 70 - 99 mg/dL    Comment: Glucose reference range applies only to samples taken after fasting for at least 8 hours.  HIV Antibody (routine testing w rflx)     Status: None   Collection Time:  02/20/22  4:48 PM  Result Value Ref Range   HIV Screen 4th Generation wRfx Non Reactive Non Reactive    Comment: Performed at [facility], [place] [address], [place], [place] [address]  Glucose, capillary     Status: Abnormal   Collection Time: 02/20/22 10:24 PM  Result Value Ref Range   Glucose-Capillary 129 (H) 70 - 99 mg/dL    Comment: Glucose reference range applies only to samples taken after fasting for at least 8 hours.  Basic metabolic panel     Status: Abnormal   Collection Time: 02/21/22  6:33 AM  Result Value Ref Range   Sodium 134 (L) 135 - 145 mmol/L   Potassium 3.8 3.5 - 5.1 mmol/L   Chloride 105 98 - 111 mmol/L   CO2 22 22 - 32 mmol/L   Glucose, Bld 125 (H) 70 - 99 mg/dL    Comment: Glucose reference range applies only to samples taken after fasting for at least 8 hours.   BUN 19 6 - 20 mg/dL   Creatinine, Ser 1.33 (H) 0.61 - 1.24 mg/dL   Calcium 8.5 (L) 8.9 - 10.3 mg/dL   GFR, Estimated >60 >60 mL/min    Comment: (NOTE) Calculated using the CKD-EPI Creatinine Equation (2021)    Anion gap 7 5 - 15    Comment: Performed at [facility], [address] [place], [place] [address]  CBC     Status: None   Collection Time: 02/21/22  6:33 AM  Result Value Ref Range   WBC 7.5 4.0 - 10.5 K/uL   RBC 4.93 4.22 - 5.81 MIL/uL   Hemoglobin 14.4 13.0 - 17.0 g/dL   HCT 45.0 39.0 - 52.0 %   MCV 91.3 80.0 - 100.0 fL   MCH 29.2 26.0 - 34.0 pg   MCHC 32.0 30.0 - 36.0 g/dL   RDW 14.9 11.5 - 15.5 %   Platelets 215 150 - 400 K/uL   nRBC 0.0 0.0 - 0.2 %    Comment: Performed at [facility], [address] [place], [place] [address]  ECHOCARDIOGRAM COMPLETE     Status: None   Collection Time: 02/21/22  8:56 AM  Result Value Ref Range   Weight 5,746.07 oz   Height 67 in   BP 129/89 mmHg   Ao pk vel 0.92 m/s   AR max vel 3.51 cm2   AV Peak grad 3.4 mmHg   Single Plane A4C EF 61.1 %   S' Lateral 3.22 cm   Area-P 1/2 3.85 cm2  Glucose, capillary      Status: Abnormal   Collection Time: 02/21/22  9:29 AM  Result Value Ref Range   Glucose-Capillary 162 (H) 70 - 99 mg/dL    Comment: Glucose reference range applies only to samples taken after fasting for at least 8 hours.  ANA w/Reflex if Positive     Status: None   Collection Time: 02/21/22 10:53 AM  Result Value Ref Range   Anti Nuclear Antibody (ANA) Negative Negative    Comment: (NOTE) Performed At: [facility] [place], [place] [id number] Rush Farmer MD [id number]   Rheumatoid factor     Status: Abnormal   Collection Time: 02/21/22 10:53 AM  Result Value Ref Range   Rhuematoid fact SerPl-aCnc 14.9 (H) <14.0 IU/mL    Comment: (NOTE) Performed At: [facility] [place], [place] [id number] Rush Farmer MD [id number]   Sedimentation rate     Status: None  Collection Time: 02/21/22 10:53 AM  Result Value Ref Range   Sed Rate 2 0 - 20 mm/hr    Comment: Performed at [facility], [address] [place], [place] [address]  C-reactive protein     Status: Abnormal   Collection Time: 02/21/22 10:53 AM  Result Value Ref Range   CRP 14.5 (H) <1.0 mg/dL    Comment: Performed at [facility] [address] [place], [place] [address]  CK     Status: None   Collection Time: 02/21/22 10:53 AM  Result Value Ref Range   Total CK 171 49 - 397 U/L    Comment: Performed at [facility], [address] [place], [place] [address]  Pathologist smear review     Status: None   Collection Time: 02/21/22 10:53 AM  Result Value Ref Range   Path Review Blood smear is reviewed.     Comment: Platelets are adequate and RBCs are unremarkable. Neutrophils are present in normal numbers with normal morphology. No abnormal or immature cells are seen. I note previous history of mild neutropenia, absolute neutrophil count 1300 on October 29, 2021, and  1400 on February 03, 2022. Clinical correlation and follow up are suggested to  further assess for intermittent neutropenia. Reviewed by Lemmie Evens. Dicie Beam, MD. Performed at [facility], [address] [place], [place] [address]   Ferritin     Status: None   Collection Time: 02/21/22 10:53 AM  Result Value Ref Range   Ferritin 116 24 - 336 ng/mL    Comment: Performed at [facility], [address] [place], [place] [address]  Thyroid Panel With TSH     Status: None   Collection Time: 02/21/22 10:53 AM  Result Value Ref Range   TSH 1.080 0.450 - 4.500 uIU/mL   T4, Total 6.9 4.5 - 12.0 ug/dL   T3 Uptake Ratio 32 24 - 39 %   Free Thyroxine Index 2.2 1.2 - 4.9    Comment: (NOTE) Performed At: [facility] [place], [place] [id number] Rush Farmer MD [id number]   Lyme Disease Serology w/Reflex     Status: None   Collection Time: 02/21/22 10:53 AM  Result Value Ref Range   Lyme Total Antibody EIA Negative Negative    Comment: (NOTE) Lyme antibodies not detected. Reflex testing is not indicated. No laboratory evidence of infection with B. burgdorferi (Lyme disease). Negative results may occur in patients recently infected (less than or equal to 14 days) with B. burgdorferi.  If recent infection is suspected, repeat testing on a new sample collected in 7 to 14 days is recommended. Performed At: [facility] [place], [place] [id number] Rush Farmer MD [id number]   Rocky mtn spotted fvr abs pnl(IgG+IgM)     Status: None   Collection Time: 02/21/22 10:53 AM  Result Value Ref Range   RMSF IgG Negative Negative   RMSF IgM 0.34 0.00 - 0.89 index    Comment: (NOTE)                                 Negative        <0.90                                 Equivocal 0.90 - 1.10  Positive        >1.10 Performed At: [facility] [place], [place] [id number] Rush Farmer MD [id number]   ANCA Profile     Status: None   Collection Time:  02/21/22 10:53 AM  Result Value Ref Range   Anti-MPO Antibodies <0.2 0.0 - 0.9 units   Anti-PR3 Antibodies <0.2 0.0 - 0.9 units   C-ANCA <1:20 Neg:<1:20 titer   P-ANCA <1:20 Neg:<1:20 titer    Comment: (NOTE) The presence of positive fluorescence exhibiting P-ANCA or C-ANCA patterns alone is not specific for the diagnosis of Wegener's Granulomatosis (WG) or microscopic polyangiitis. Decisions about treatment should not be based solely on ANCA IFA results.  The International ANCA Group Consensus recommends follow up testing of positive sera with both PR-3 and MPO-ANCA enzyme immunoassays. As many as 5% serum samples are positive only by EIA. Ref. AM J Clin Pathol 1999;111:507-513.    Atypical P-ANCA titer <1:20 Neg:<1:20 titer    Comment: (NOTE) The atypical pANCA pattern has been observed in a significant percentage of patients with ulcerative colitis, primary sclerosing cholangitis and autoimmune hepatitis. Performed At: [facility] [place], [place] [id number] Rush Farmer MD [id number]   C4 complement     Status: None   Collection Time: 02/21/22 10:53 AM  Result Value Ref Range   Complement C4, Body Fluid 27 12 - 38 mg/dL    Comment: (NOTE) Performed At: [facility] [place], [place] [id number] Rush Farmer MD [id number]   C3 complement     Status: None   Collection Time: 02/21/22 10:53 AM  Result Value Ref Range   C3 Complement 153 82 - 167 mg/dL    Comment: (NOTE) Performed At: [facility] [place], [place] [id number] Rush Farmer MD [id number]   Glucose, capillary     Status: Abnormal   Collection Time: 02/21/22 12:20 PM  Result Value Ref Range   Glucose-Capillary 157 (H) 70 - 99 mg/dL    Comment: Glucose reference range applies only to samples taken after fasting for at least 8 hours.  Glucose, capillary     Status: Abnormal   Collection Time: 02/21/22  4:10 PM  Result Value  Ref Range   Glucose-Capillary 112 (H) 70 - 99 mg/dL    Comment: Glucose reference range applies only to samples taken after fasting for at least 8 hours.  Glucose, capillary     Status: Abnormal   Collection Time: 02/21/22  7:57 PM  Result Value Ref Range   Glucose-Capillary 134 (H) 70 - 99 mg/dL    Comment: Glucose reference range applies only to samples taken after fasting for at least 8 hours.  QuantiFERON-TB Gold Plus     Status: None   Collection Time: 02/22/22  6:23 AM  Result Value Ref Range   QuantiFERON Incubation Incubation performed.    QuantiFERON-TB Gold Plus Negative Negative    Comment: (NOTE) No response to M tuberculosis antigens detected. Infection with M tuberculosis is unlikely, but high risk individuals should be considered for additional testing (ATS/IDSA/CDC Clinical Practice Guidelines, 2017). The reference range is an Antigen minus Nil result of <0.35 IU/mL. Chemiluminescence immunoassay methodology Performed At: [facility] [place], [place] [id number] Rush Farmer MD [id number]   CBC     Status: None   Collection Time: 02/22/22  6:23 AM  Result Value Ref Range   WBC 4.5 4.0 - 10.5 K/uL   RBC 4.90 4.22 - 5.81 MIL/uL  Hemoglobin 14.4 13.0 - 17.0 g/dL   HCT 45.6 39.0 - 52.0 %   MCV 93.1 80.0 - 100.0 fL   MCH 29.4 26.0 - 34.0 pg   MCHC 31.6 30.0 - 36.0 g/dL   RDW 14.7 11.5 - 15.5 %   Platelets 214 150 - 400 K/uL   nRBC 0.0 0.0 - 0.2 %    Comment: Performed at [facility], [address], [place], [place] [address]  Basic metabolic panel     Status: Abnormal   Collection Time: 02/22/22  6:23 AM  Result Value Ref Range   Sodium 136 135 - 145 mmol/L   Potassium 3.6 3.5 - 5.1 mmol/L   Chloride 103 98 - 111 mmol/L   CO2 22 22 - 32 mmol/L   Glucose, Bld 101 (H) 70 - 99 mg/dL    Comment: Glucose reference range applies only to samples taken after fasting for at least 8 hours.   BUN 18 6 - 20 mg/dL   Creatinine,  Ser 1.41 (H) 0.61 - 1.24 mg/dL   Calcium 8.7 (L) 8.9 - 10.3 mg/dL   GFR, Estimated 60 (L) >60 mL/min    Comment: (NOTE) Calculated using the CKD-EPI Creatinine Equation (2021)    Anion gap 11 5 - 15    Comment: Performed at [facility], [place], [place] [address]  QuantiFERON-TB [place] Rm Plus     Status: None   Collection Time: 02/22/22  6:23 AM  Result Value Ref Range   QuantiFERON Criteria Comment     Comment: (NOTE) QuantiFERON-TB Gold Plus is a qualitative indirect test for M tuberculosis infection (including disease) and is intended for use in conjunction with risk assessment, radiography, and other medical and diagnostic evaluations. The QuantiFERON-TB Gold Plus result is determined by subtracting the Nil value from either TB antigen (Ag) value. The Mitogen tube serves as a control for the test.    QuantiFERON TB1 Ag Value 0.02 IU/mL   QuantiFERON TB2 Ag Value 0.03 IU/mL   QuantiFERON Nil Value 0.08 IU/mL   QuantiFERON Mitogen Value >10.00 IU/mL    Comment: (NOTE) Performed At: [facility] [place], [place] [id number] Rush Farmer MD [id number]   Glucose, capillary     Status: Abnormal   Collection Time: 02/22/22  8:27 AM  Result Value Ref Range   Glucose-Capillary 120 (H) 70 - 99 mg/dL    Comment: Glucose reference range applies only to samples taken after fasting for at least 8 hours.  Glucose, capillary     Status: Abnormal   Collection Time: 02/22/22 11:56 AM  Result Value Ref Range   Glucose-Capillary 129 (H) 70 - 99 mg/dL    Comment: Glucose reference range applies only to samples taken after fasting for at least 8 hours.  Glucose, capillary     Status: Abnormal   Collection Time: 02/22/22  5:08 PM  Result Value Ref Range   Glucose-Capillary 127 (H) 70 - 99 mg/dL    Comment: Glucose reference range applies only to samples taken after fasting for at least 8 hours.  Glucose, capillary     Status: Abnormal    Collection Time: 02/22/22  8:09 PM  Result Value Ref Range   Glucose-Capillary 124 (H) 70 - 99 mg/dL    Comment: Glucose reference range applies only to samples taken after fasting for at least 8 hours.   Comment 1 Notify RN   CBC     Status: None   Collection Time: 02/23/22  5:54 AM  Result Value Ref Range   WBC 4.1 4.0 - 10.5 K/uL   RBC 4.72 4.22 - 5.81 MIL/uL   Hemoglobin 14.1 13.0 - 17.0 g/dL   HCT 44.4 39.0 - 52.0 %   MCV 94.1 80.0 - 100.0 fL   MCH 29.9 26.0 - 34.0 pg   MCHC 31.8 30.0 - 36.0 g/dL   RDW 14.5 11.5 - 15.5 %   Platelets 237 150 - 400 K/uL   nRBC 0.0 0.0 - 0.2 %    Comment: Performed at [facility], [address] [place], [place] [address]  Comprehensive metabolic panel     Status: Abnormal   Collection Time: 02/23/22  5:54 AM  Result Value Ref Range   Sodium 136 135 - 145 mmol/L   Potassium 3.6 3.5 - 5.1 mmol/L   Chloride 104 98 - 111 mmol/L   CO2 21 (L) 22 - 32 mmol/L   Glucose, Bld 98 70 - 99 mg/dL    Comment: Glucose reference range applies only to samples taken after fasting for at least 8 hours.   BUN 24 (H) 6 - 20 mg/dL   Creatinine, Ser 1.42 (H) 0.61 - 1.24 mg/dL   Calcium 8.9 8.9 - 10.3 mg/dL   Total Protein 7.8 6.5 - 8.1 g/dL   Albumin 3.5 3.5 - 5.0 g/dL   AST 22 15 - 41 U/L   ALT 20 0 - 44 U/L   Alkaline Phosphatase 45 38 - 126 U/L   Total Bilirubin 1.0 0.3 - 1.2 mg/dL   GFR, Estimated 59 (L) >60 mL/min    Comment: (NOTE) Calculated using the CKD-EPI Creatinine Equation (2021)    Anion gap 11 5 - 15    Comment: Performed at [facility], [address] [place], [place] [address]  Glucose, capillary     Status: Abnormal   Collection Time: 02/23/22  7:33 AM  Result Value Ref Range   Glucose-Capillary 108 (H) 70 - 99 mg/dL    Comment: Glucose reference range applies only to samples taken after fasting for at least 8 hours.  Glucose, capillary     Status: Abnormal   Collection Time: 02/23/22 11:33 AM  Result Value Ref  Range   Glucose-Capillary 125 (H) 70 - 99 mg/dL    Comment: Glucose reference range applies only to samples taken after fasting for at least 8 hours.  Comp Met (CMET)     Status: Abnormal   Collection Time: 03/01/22  9:34 AM  Result Value Ref Range   Glucose 116 (H) 70 - 99 mg/dL   BUN 19 6 - 24 mg/dL   Creatinine, Ser 1.54 (H) 0.76 - 1.27 mg/dL   eGFR 54 (L) >59 mL/min/1.73   BUN/Creatinine Ratio 12 9 - 20   Sodium 138 134 - 144 mmol/L   Potassium 4.5 3.5 - 5.2 mmol/L   Chloride 102 96 - 106 mmol/L   CO2 19 (L) 20 - 29 mmol/L   Calcium 9.3 8.7 - 10.2 mg/dL   Total Protein 7.9 6.0 - 8.5 g/dL   Albumin 4.2 3.8 - 4.9 g/dL    Comment:                **Effective March 08, 2022 Albumin reference interval**                  will be changing to:  Age                  Male          Male                            0 -   7 days       3.6 - 4.9      3.6 - 4.9                            8 -  30 days       3.5 - 4.6      3.5 - 4.6                            1 -   6 months     3.7 - 4.8      3.7 - 4.8                     7 months -   2 years      4.0 - 5.0      4.0 - 5.0                            3 -   5 years      4.1 - 5.0      4.1 - 5.0                            6 -  12 years      4.2 - 5.0      4.2 - 5.0                           13 -  30 years      4.3 - 5.2      4.0 - 5.0                           31 -  50 years      4.1 - 5.1      3.9 - 4.9                           51 -  60 years      3.8 - 4.9      3.8 - 4.9                           61 -  70 years      3.9 - 4.9      3.9 - 4.9                           71 -  80 years      3.8 - 4.8      3.8 - 4._0 -  89 years      3.7 - 4.7      3.7 - 4.7  90 - 199 years      3.6 - 4.6      3.6 - 4.6    Globulin, Total 3.7 1.5 - 4.5 g/dL   Albumin/Globulin Ratio 1.1 (L) 1.2 - 2.2   Bilirubin Total 0.5 0.0 - 1.2 mg/dL   Alkaline Phosphatase 71 44 - 121 IU/L   AST 28  0 - 40 IU/L   ALT 37 0 - 44 IU/L  CBC w/Diff     Status: Abnormal   Collection Time: 03/01/22  9:34 AM  Result Value Ref Range   WBC 3.8 3.4 - 10.8 x10E3/uL   RBC 5.03 4.14 - 5.80 x10E6/uL   Hemoglobin 14.9 13.0 - 17.7 g/dL   Hematocrit 45.1 37.5 - 51.0 %   MCV 90 79 - 97 fL   MCH 29.6 26.6 - 33.0 pg   MCHC 33.0 31.5 - 35.7 g/dL   RDW 13.8 11.6 - 15.4 %   Platelets 318 150 - 450 x10E3/uL   Neutrophils 34 Not Estab. %   Lymphs 49 Not Estab. %   Monocytes 13 Not Estab. %   Eos 3 Not Estab. %   Basos 1 Not Estab. %   Neutrophils Absolute 1.3 (L) 1.4 - 7.0 x10E3/uL   Lymphocytes Absolute 1.9 0.7 - 3.1 x10E3/uL   Monocytes Absolute 0.5 0.1 - 0.9 x10E3/uL   EOS (ABSOLUTE) 0.1 0.0 - 0.4 x10E3/uL   Basophils Absolute 0.0 0.0 - 0.2 x10E3/uL   Immature Granulocytes 0 Not Estab. %   Immature Grans (Abs) 0.0 0.0 - 0.1 x10E3/uL  Urinalysis, Routine w reflex microscopic     Status: Abnormal   Collection Time: 03/18/22  2:36 PM  Result Value Ref Range   Specific Gravity, UA 1.025 1.005 - 1.030   pH, UA 5.5 5.0 - 7.5   Color, UA Yellow Yellow   Appearance Ur Clear Clear   Leukocytes,UA Negative Negative   Protein,UA 1+ (A) Negative/Trace   Glucose, UA 3+ (A) Negative   Ketones, UA Negative Negative   RBC, UA Trace (A) Negative   Bilirubin, UA Negative Negative   Urobilinogen, Ur 0.2 0.2 - 1.0 mg/dL   Nitrite, UA Negative Negative   Microscopic Examination See below:   Microscopic Examination     Status: Abnormal   Collection Time: 03/18/22  2:36 PM   Urine  Result Value Ref Range   WBC, UA None seen 0 - 5 /hpf   RBC, Urine 0-2 0 - 2 /hpf   Epithelial Cells (non renal) 0-10 0 - 10 /hpf   Mucus, UA Present (A) Not Estab.   Bacteria, UA None seen None seen/Few  TSH     Status: None   Collection Time: 03/18/22  2:38 PM  Result Value Ref Range   TSH 1.810 0.450 - 4.500 uIU/mL  PSA     Status: None   Collection Time: 03/18/22  2:38 PM  Result Value Ref Range   Prostate Specific  Ag, Serum 1.9 0.0 - 4.0 ng/mL    Comment: Roche ECLIA methodology. According to the American Urological Association, Serum PSA should decrease and remain at undetectable levels after radical prostatectomy. The AUA defines biochemical recurrence as an initial PSA value 0.2 ng/mL or greater followed by a subsequent confirmatory PSA value 0.2 ng/mL or greater. Values obtained with different assay methods or kits cannot be used interchangeably. Results cannot be interpreted as absolute evidence of the presence or absence of malignant disease.   Lipid panel     Status: Abnormal  Collection Time: 03/18/22  2:38 PM  Result Value Ref Range   Cholesterol, Total 188 100 - 199 mg/dL   Triglycerides 91 0 - 149 mg/dL   HDL 41 >39 mg/dL   VLDL Cholesterol Cal 17 5 - 40 mg/dL   LDL Chol Calc (NIH) 130 (H) 0 - 99 mg/dL   Chol/HDL Ratio 4.6 0.0 - 5.0 ratio    Comment:                                   T. Chol/HDL Ratio                                             Men  Women                               1/2 Avg.Risk  3.4    3.3                                   Avg.Risk  5.0    4.4                                2X Avg.Risk  9.6    7.1                                3X Avg.Risk 23.4   11.0   CBC with Differential/Platelet     Status: None   Collection Time: 03/18/22  2:38 PM  Result Value Ref Range   WBC 4.1 3.4 - 10.8 x10E3/uL   RBC 5.07 4.14 - 5.80 x10E6/uL   Hemoglobin 15.3 13.0 - 17.7 g/dL   Hematocrit 46.1 37.5 - 51.0 %   MCV 91 79 - 97 fL   MCH 30.2 26.6 - 33.0 pg   MCHC 33.2 31.5 - 35.7 g/dL   RDW 13.4 11.6 - 15.4 %   Platelets 250 150 - 450 x10E3/uL   Neutrophils 42 Not Estab. %   Lymphs 40 Not Estab. %   Monocytes 14 Not Estab. %   Eos 3 Not Estab. %   Basos 1 Not Estab. %   Neutrophils Absolute 1.7 1.4 - 7.0 x10E3/uL   Lymphocytes Absolute 1.6 0.7 - 3.1 x10E3/uL   Monocytes Absolute 0.6 0.1 - 0.9 x10E3/uL   EOS (ABSOLUTE) 0.1 0.0 - 0.4 x10E3/uL   Basophils Absolute 0.0 0.0 -  0.2 x10E3/uL   Immature Granulocytes 0 Not Estab. %   Immature Grans (Abs) 0.0 0.0 - 0.1 x10E3/uL  Comprehensive metabolic panel     Status: Abnormal   Collection Time: 03/18/22  2:38 PM  Result Value Ref Range   Glucose 91 70 - 99 mg/dL   BUN 15 6 - 24 mg/dL   Creatinine, Ser 1.48 (H) 0.76 - 1.27 mg/dL   eGFR 57 (L) >59 mL/min/1.73   BUN/Creatinine Ratio 10 9 - 20   Sodium 138 134 - 144 mmol/L   Potassium 4.1 3.5 - 5.2 mmol/L   Chloride 103 96 - 106 mmol/L   CO2 19 (L) 20 - 29 mmol/L  Calcium 9.4 8.7 - 10.2 mg/dL   Total Protein 8.0 6.0 - 8.5 g/dL   Albumin 4.6 3.8 - 4.9 g/dL    Comment:               **Please note reference interval change**   Globulin, Total 3.4 1.5 - 4.5 g/dL   Albumin/Globulin Ratio 1.4 1.2 - 2.2   Bilirubin Total 0.8 0.0 - 1.2 mg/dL   Alkaline Phosphatase 69 44 - 121 IU/L   AST 16 0 - 40 IU/L   ALT 21 0 - 44 IU/L  HgB A1c     Status: Abnormal   Collection Time: 03/18/22  2:38 PM  Result Value Ref Range   Hgb A1c MFr Bld 6.5 (H) 4.8 - 5.6 %    Comment:          Prediabetes: 5.7 - 6.4          Diabetes: >6.4          Glycemic control for adults with diabetes: <7.0    Est. average glucose Bld gHb Est-mCnc 140 mg/dL  Hepatitis C Antibody     Status: None   Collection Time: 03/18/22  2:38 PM  Result Value Ref Range   Hep C Virus Ab Non Reactive Non Reactive    Comment: HCV antibody alone does not differentiate between previously resolved infection and active infection. Equivocal and Reactive HCV antibody results should be followed up with an HCV RNA test to support the diagnosis of active HCV infection.   Lactic acid, plasma     Status: Abnormal   Collection Time: 04/06/22  5:14 PM  Result Value Ref Range   Lactic Acid, Venous 2.3 (HH) 0.5 - 1.9 mmol/L    Comment: CRITICAL RESULT CALLED TO, READ BACK BY AND VERIFIED WITH COURTANAY NESTER 04/06/22 1746 MU Performed at [facility], [address] [place], [place] [address]   Comprehensive  metabolic panel     Status: Abnormal   Collection Time: 04/06/22  5:15 PM  Result Value Ref Range   Sodium 137 135 - 145 mmol/L   Potassium 4.3 3.5 - 5.1 mmol/L   Chloride 106 98 - 111 mmol/L   CO2 23 22 - 32 mmol/L   Glucose, Bld 129 (H) 70 - 99 mg/dL    Comment: Glucose reference range applies only to samples taken after fasting for at least 8 hours.   BUN 13 6 - 20 mg/dL   Creatinine, Ser 1.65 (H) 0.61 - 1.24 mg/dL   Calcium 9.0 8.9 - 10.3 mg/dL   Total Protein 8.4 (H) 6.5 - 8.1 g/dL   Albumin 4.1 3.5 - 5.0 g/dL   AST 26 15 - 41 U/L   ALT 29 0 - 44 U/L   Alkaline Phosphatase 57 38 - 126 U/L   Total Bilirubin 1.3 (H) 0.3 - 1.2 mg/dL   GFR, Estimated 50 (L) >60 mL/min    Comment: (NOTE) Calculated using the CKD-EPI Creatinine Equation (2021)    Anion gap 8 5 - 15    Comment: Performed at [facility], [address] [place], [place] [address]  CBC with Differential     Status: Abnormal   Collection Time: 04/06/22  5:15 PM  Result Value Ref Range   WBC 12.1 (H) 4.0 - 10.5 K/uL   RBC 5.26 4.22 - 5.81 MIL/uL   Hemoglobin 15.8 13.0 - 17.0 g/dL   HCT 49.8 39.0 - 52.0 %   MCV 94.7 80.0 - 100.0 fL  MCH 30.0 26.0 - 34.0 pg   MCHC 31.7 30.0 - 36.0 g/dL   RDW 14.7 11.5 - 15.5 %   Platelets 262 150 - 400 K/uL   nRBC 0.0 0.0 - 0.2 %   Neutrophils Relative % 88 %   Neutro Abs 10.7 (H) 1.7 - 7.7 K/uL   Lymphocytes Relative 6 %   Lymphs Abs 0.7 0.7 - 4.0 K/uL   Monocytes Relative 5 %   Monocytes Absolute 0.6 0.1 - 1.0 K/uL   Eosinophils Relative 0 %   Eosinophils Absolute 0.0 0.0 - 0.5 K/uL   Basophils Relative 0 %   Basophils Absolute 0.0 0.0 - 0.1 K/uL   Immature Granulocytes 1 %   Abs Immature Granulocytes 0.09 (H) 0.00 - 0.07 K/uL    Comment: Performed at [facility], [address] [place], [place] [address]  Protime-INR     Status: Abnormal   Collection Time: 04/06/22  5:15 PM  Result Value Ref Range   Prothrombin Time 16.9 (H) 11.4 - 15.2 seconds    INR 1.4 (H) 0.8 - 1.2    Comment: (NOTE) INR goal varies based on device and disease states. Performed at [facility], [address] [place], [place] [address]   APTT     Status: None   Collection Time: 04/06/22  5:15 PM  Result Value Ref Range   aPTT 29 24 - 36 seconds    Comment: Performed at [facility], [address] [place], [place] [address]  Culture, blood (Routine x 2)     Status: None   Collection Time: 04/06/22  5:17 PM   Specimen: BLOOD  Result Value Ref Range   Specimen Description BLOOD RIGHT ANTECUBITAL    Special Requests      BOTTLES DRAWN AEROBIC AND ANAEROBIC Blood Culture results may not be optimal due to an excessive volume of blood received in culture bottles   Culture      NO GROWTH 5 DAYS Performed at [facility], [address] [place], [place] [address]    Report Status 04/11/2022 FINAL   Culture, blood (Routine x 2)     Status: None   Collection Time: 04/06/22  5:18 PM   Specimen: BLOOD RIGHT HAND  Result Value Ref Range   Specimen Description BLOOD RIGHT HAND    Special Requests      BOTTLES DRAWN AEROBIC AND ANAEROBIC Blood Culture adequate volume   Culture      NO GROWTH 5 DAYS Performed at [facility], [address] [place], [place] [address]    Report Status 04/11/2022 FINAL   Resp Panel by RT-PCR (Flu A&B, Covid) Anterior Nasal Swab     Status: None   Collection Time: 04/06/22  5:52 PM   Specimen: Anterior Nasal Swab  Result Value Ref Range   SARS Coronavirus 2 by RT PCR NEGATIVE NEGATIVE    Comment: (NOTE) SARS-CoV-2 target nucleic acids are NOT DETECTED.  The SARS-CoV-2 RNA is generally detectable in upper respiratory specimens during the acute phase of infection. The lowest concentration of SARS-CoV-2 viral copies this assay can detect is 138 copies/mL. A negative result does not preclude SARS-Cov-2 infection and should not be used as the sole basis for treatment or other patient  management decisions. A negative result may occur with  improper specimen collection/handling, submission of specimen other than nasopharyngeal swab, presence of viral mutation(s) within the areas targeted by this assay, and inadequate number of viral copies(<138 copies/mL). A  negative result must be combined with clinical observations, patient history, and epidemiological information. The expected result is Negative.  Fact Sheet for Patients:  EntrepreneurPulse.com.au  Fact Sheet for Healthcare Providers:  IncredibleEmployment.be  This test is no t yet approved or cleared by the [place] FDA and  has been authorized for detection and/or diagnosis of SARS-CoV-2 by FDA under an Emergency Use Authorization (EUA). This EUA will remain  in effect (meaning this test can be used) for the duration of the COVID-19 declaration under Section 564(b)(1) of the Act, 21 U.S.C.section 360bbb-3(b)(1), unless the authorization is terminated  or revoked sooner.       Influenza A by PCR NEGATIVE NEGATIVE   Influenza B by PCR NEGATIVE NEGATIVE    Comment: (NOTE) The Xpert Xpress SARS-CoV-2/FLU/RSV plus assay is intended as an aid in the diagnosis of influenza from Nasopharyngeal swab specimens and should not be used as a sole basis for treatment. Nasal washings and aspirates are unacceptable for Xpert Xpress SARS-CoV-2/FLU/RSV testing.  Fact Sheet for Patients: EntrepreneurPulse.com.au  Fact Sheet for Healthcare Providers: IncredibleEmployment.be  This test is not yet approved or cleared by the [place] FDA and has been authorized for detection and/or diagnosis of SARS-CoV-2 by FDA under an Emergency Use Authorization (EUA). This EUA will remain in effect (meaning this test can be used) for the duration of the COVID-19 declaration under Section 564(b)(1) of the Act, 21 U.S.C. section 360bbb-3(b)(1), unless the  authorization is terminated or revoked.  Performed at [facility], [address] [place], [place] [address]   Urinalysis, Complete w Microscopic Urine, Clean Catch     Status: Abnormal   Collection Time: 04/06/22  7:16 PM  Result Value Ref Range   Color, Urine YELLOW (A) YELLOW   APPearance CLEAR (A) CLEAR   Specific Gravity, Urine 1.025 1.005 - 1.030   pH 5.0 5.0 - 8.0   Glucose, UA >=500 (A) NEGATIVE mg/dL   Hgb urine dipstick NEGATIVE NEGATIVE   Bilirubin Urine NEGATIVE NEGATIVE   Ketones, ur NEGATIVE NEGATIVE mg/dL   Protein, ur NEGATIVE NEGATIVE mg/dL   Nitrite NEGATIVE NEGATIVE   Leukocytes,Ua NEGATIVE NEGATIVE   RBC / HPF 0-5 0 - 5 RBC/hpf   WBC, UA 0-5 0 - 5 WBC/hpf   Bacteria, UA NONE SEEN NONE SEEN   Squamous Epithelial / LPF NONE SEEN 0 - 5   Mucus PRESENT     Comment: Performed at [facility], [address], [place], [place] [address]  Urine Culture     Status: Abnormal   Collection Time: 04/06/22  7:16 PM   Specimen: Urine, Random  Result Value Ref Range   Specimen Description      URINE, RANDOM Performed at [facility], [address], [place], [place] [address]    Special Requests      NONE Performed at [facility], [address] [place], [place] [address]    Culture (A)     10,000 COLONIES/mL DIPHTHEROIDS(CORYNEBACTERIUM SPECIES) Standardized susceptibility testing for this organism is not available. Performed at [facility], [place] [address] [place], [place] [address]    Report Status 04/09/2022 FINAL   Legionella Pneumophila Serogp 1 Ur Ag     Status: None   Collection Time: 04/06/22  7:16 PM  Result Value Ref Range   L. pneumophila Serogp 1 Ur Ag Negative Negative    Comment: (NOTE) Presumptive negative for L. pneumophila serogroup 1 antigen in urine, suggesting no recent or current  infection. Legionnaires' disease cannot be ruled out since other serogroups and species may also cause  disease. Performed At: [facility] [place], [place] [id number] Rush Farmer MD [id number]    Source of Sample URINE, RANDOM     Comment: Performed at [name], [address] [place], [place] [address]  Strep pneumoniae urinary antigen (not at [facility])     Status: None   Collection Time: 04/06/22  7:16 PM  Result Value Ref Range   Strep Pneumo Urinary Antigen NEGATIVE NEGATIVE    Comment:        Infection due to S. pneumoniae cannot be absolutely ruled out since the antigen present may be below the detection limit of the test. Performed at [facility], [address] [place], [place] [address]   Lactic acid, plasma     Status: Abnormal   Collection Time: 04/06/22 10:23 PM  Result Value Ref Range   Lactic Acid, Venous 2.2 (HH) 0.5 - 1.9 mmol/L    Comment: CRITICAL VALUE NOTED. VALUE IS CONSISTENT WITH PREVIOUSLY REPORTED/CALLED VALUE [facility] Performed at [name], [address] [place], [place] [address]   CBC with Differential     Status: Abnormal   Collection Time: 04/06/22 10:23 PM  Result Value Ref Range   WBC 18.7 (H) 4.0 - 10.5 K/uL   RBC 5.02 4.22 - 5.81 MIL/uL   Hemoglobin 15.1 13.0 - 17.0 g/dL   HCT 47.1 39.0 - 52.0 %   MCV 93.8 80.0 - 100.0 fL   MCH 30.1 26.0 - 34.0 pg   MCHC 32.1 30.0 - 36.0 g/dL   RDW 14.9 11.5 - 15.5 %   Platelets 239 150 - 400 K/uL   nRBC 0.0 0.0 - 0.2 %   Neutrophils Relative % 88 %   Neutro Abs 15.8 (H) 1.7 - 7.7 K/uL   Lymphocytes Relative 6 %   Lymphs Abs 1.1 0.7 - 4.0 K/uL   Monocytes Relative 5 %   Monocytes Absolute 0.8 0.1 - 1.0 K/uL   Eosinophils Relative 0 %   Eosinophils Absolute 0.0 0.0 - 0.5 K/uL   Basophils Relative 0 %   Basophils Absolute 0.0 0.0 - 0.1 K/uL   WBC Morphology MORPHOLOGY UNREMARKABLE    RBC Morphology MORPHOLOGY UNREMARKABLE    Smear Review Normal platelet morphology    Immature Granulocytes 1 %   Abs Immature Granulocytes 0.20 (H) 0.00 - 0.07  K/uL    Comment: Performed at [facility], [address] [place], [place] [address]  Protime-INR     Status: Abnormal   Collection Time: 04/06/22 10:23 PM  Result Value Ref Range   Prothrombin Time 17.5 (H) 11.4 - 15.2 seconds   INR 1.5 (H) 0.8 - 1.2    Comment: (NOTE) INR goal varies based on device and disease states. Performed at [facility], [address] [place], [place] [address]   APTT     Status: Abnormal   Collection Time: 04/06/22 10:23 PM  Result Value Ref Range   aPTT 111 (H) 24 - 36 seconds    Comment:        IF BASELINE aPTT IS ELEVATED, SUGGEST PATIENT RISK ASSESSMENT BE USED TO DETERMINE APPROPRIATE ANTICOAGULANT THERAPY. Performed at [facility], [address] [place], [place] [address]   Cortisol     Status: None   Collection Time: 04/06/22 10:23 PM  Result Value Ref Range   Cortisol, Plasma 22.4 ug/dL  Comment: (NOTE) AM    6.7 - 22.6 ug/dL PM   <10.0       ug/dL Performed at [place] [address] [place], [place] [address]   Fibrinogen     Status: None   Collection Time: 04/06/22 10:23 PM  Result Value Ref Range   Fibrinogen 454 210 - 475 mg/dL    Comment: (NOTE) Fibrinogen results may be underestimated in patients receiving thrombolytic therapy. Performed at [facility], [address] [place], [place] [address]   Type and screen [place]     Status: None   Collection Time: 04/06/22 10:23 PM  Result Value Ref Range   ABO/RH(D) AB POS    Antibody Screen NEG    Sample Expiration      04/09/2022,2359 Performed at [facility], [address] [place], [place] [address]   Procalcitonin - Baseline     Status: None   Collection Time: 04/06/22 10:23 PM  Result Value Ref Range   Procalcitonin 2.27 ng/mL    Comment:        Interpretation: PCT > 2 ng/mL: Systemic infection (sepsis) is likely, unless other causes are known. (NOTE)       Sepsis  PCT Algorithm           Lower Respiratory Tract                                      Infection PCT Algorithm    ----------------------------     ----------------------------         PCT < 0.25 ng/mL                PCT < 0.10 ng/mL          Strongly encourage             Strongly discourage   discontinuation of antibiotics    initiation of antibiotics    ----------------------------     -----------------------------       PCT 0.25 - 0.50 ng/mL            PCT 0.10 - 0.25 ng/mL               OR       >80% decrease in PCT            Discourage initiation of                                            antibiotics      Encourage discontinuation           of antibiotics    ----------------------------     -----------------------------         PCT >= 0.50 ng/mL              PCT 0.26 - 0.50 ng/mL               AND       <80% decrease in PCT              Encourage initiation of  antibiotics       Encourage continuation           of antibiotics    ----------------------------     -----------------------------        PCT >= 0.50 ng/mL                  PCT > 0.50 ng/mL               AND         increase in PCT                  Strongly encourage                                      initiation of antibiotics    Strongly encourage escalation           of antibiotics                                     -----------------------------                                           PCT <= 0.25 ng/mL                                                 OR                                        > 80% decrease in PCT                                      Discontinue / Do not initiate                                             antibiotics  Performed at [facility], [address] [place], [place] [address]   Brain natriuretic peptide     Status: None   Collection Time: 04/06/22 10:23 PM  Result Value Ref Range   B Natriuretic Peptide 34.6 0.0 - 100.0  pg/mL    Comment: Performed at [facility], [address] [place], [place] [address]  MRSA Next Gen by PCR, Nasal     Status: None   Collection Time: 04/06/22 10:30 PM   Specimen: Nasal Mucosa; Nasal Swab  Result Value Ref Range   MRSA by PCR Next Gen NOT DETECTED NOT DETECTED    Comment: (NOTE) The GeneXpert MRSA Assay (FDA approved for NASAL specimens only), is one component of a comprehensive MRSA colonization surveillance program. It is not intended to diagnose MRSA infection nor to guide or monitor treatment for MRSA infections. Test performance is not FDA approved in patients less than 21 years old. Performed at [facility], [place], [place]  [address]   Glucose, capillary     Status: Abnormal   Collection Time: 04/06/22 10:41 PM  Result Value Ref Range   Glucose-Capillary 116 (H) 70 - 99 mg/dL    Comment: Glucose reference range applies only to samples taken after fasting for at least 8 hours.  Heparin level (unfractionated)     Status: Abnormal   Collection Time: 04/07/22  2:00 AM  Result Value Ref Range   Heparin Unfractionated 1.06 (H) 0.30 - 0.70 IU/mL    Comment: (NOTE) The clinical reportable range upper limit is being lowered to >1.10 to align with the FDA approved guidance for the current laboratory assay.  If heparin results are below expected values, and patient dosage has  been confirmed, suggest follow up testing of antithrombin III levels. Performed at [facility], [address] [place], [place] [address]   CBC     Status: Abnormal   Collection Time: 04/07/22  2:00 AM  Result Value Ref Range   WBC 19.0 (H) 4.0 - 10.5 K/uL   RBC 4.94 4.22 - 5.81 MIL/uL   Hemoglobin 14.9 13.0 - 17.0 g/dL   HCT 45.5 39.0 - 52.0 %   MCV 92.1 80.0 - 100.0 fL   MCH 30.2 26.0 - 34.0 pg   MCHC 32.7 30.0 - 36.0 g/dL   RDW 14.8 11.5 - 15.5 %   Platelets 229 150 - 400 K/uL   nRBC 0.0 0.0 - 0.2 %    Comment: Performed at  [facility], [address], [place], [place] [address]  Basic metabolic panel     Status: Abnormal   Collection Time: 04/07/22  2:00 AM  Result Value Ref Range   Sodium 138 135 - 145 mmol/L   Potassium 3.9 3.5 - 5.1 mmol/L   Chloride 110 98 - 111 mmol/L   CO2 21 (L) 22 - 32 mmol/L   Glucose, Bld 125 (H) 70 - 99 mg/dL    Comment: Glucose reference range applies only to samples taken after fasting for at least 8 hours.   BUN 14 6 - 20 mg/dL   Creatinine, Ser 1.56 (H) 0.61 - 1.24 mg/dL   Calcium 8.4 (L) 8.9 - 10.3 mg/dL   GFR, Estimated 53 (L) >60 mL/min    Comment: (NOTE) Calculated using the CKD-EPI Creatinine Equation (2021)    Anion gap 7 5 - 15    Comment: Performed at [facility], [address], [place], [place] [address]  Magnesium     Status: None   Collection Time: 04/07/22  2:00 AM  Result Value Ref Range   Magnesium 1.7 1.7 - 2.4 mg/dL    Comment: Performed at [facility], [address], [place], [place] [address]  Phosphorus     Status: None   Collection Time: 04/07/22  2:00 AM  Result Value Ref Range   Phosphorus 2.7 2.5 - 4.6 mg/dL    Comment: Performed at [facility], [address] [place],  [address]  Procalcitonin     Status: None   Collection Time: 04/07/22  2:00 AM  Result Value Ref Range   Procalcitonin 2.79 ng/mL    Comment:        Interpretation: PCT > 2 ng/mL: Systemic infection (sepsis) is likely, unless other causes are known. (NOTE)       Sepsis PCT Algorithm           Lower Respiratory Tract  Infection PCT Algorithm    ----------------------------     ----------------------------         PCT < 0.25 ng/mL                PCT < 0.10 ng/mL          Strongly encourage             Strongly discourage   discontinuation of antibiotics    initiation of antibiotics    ----------------------------     -----------------------------       PCT 0.25 - 0.50 ng/mL             PCT 0.10 - 0.25 ng/mL               OR       >80% decrease in PCT            Discourage initiation of                                            antibiotics      Encourage discontinuation           of antibiotics    ----------------------------     -----------------------------         PCT >= 0.50 ng/mL              PCT 0.26 - 0.50 ng/mL               AND       <80% decrease in PCT              Encourage initiation of                                             antibiotics       Encourage continuation           of antibiotics    ----------------------------     -----------------------------        PCT >= 0.50 ng/mL                  PCT > 0.50 ng/mL               AND         increase in PCT                  Strongly encourage                                      initiation of antibiotics    Strongly encourage escalation           of antibiotics                                     -----------------------------                                           PCT <= 0.25 ng/mL  OR                                        > 80% decrease in PCT                                      Discontinue / Do not initiate                                             antibiotics  Performed at [facility], [place], [place] [address]   Troponin I (High Sensitivity)     Status: None   Collection Time: 04/07/22  2:00 AM  Result Value Ref Range   Troponin I (High Sensitivity) 16 <18 ng/L    Comment: (NOTE) Elevated high sensitivity troponin I (hsTnI) values and significant  changes across serial measurements may suggest ACS but many other  chronic and acute conditions are known to elevate hsTnI results.  Refer to the "Links" section for chest pain algorithms and additional  guidance. Performed at [facility], [place], [place] [address]   Heparin level (unfractionated)     Status: None   Collection Time:  04/07/22  9:26 AM  Result Value Ref Range   Heparin Unfractionated 0.58 0.30 - 0.70 IU/mL    Comment: (NOTE) The clinical reportable range upper limit is being lowered to >1.10 to align with the FDA approved guidance for the current laboratory assay.  If heparin results are below expected values, and patient dosage has  been confirmed, suggest follow up testing of antithrombin III levels. Performed at [facility], [place], [place] [address]   Heparin level (unfractionated)     Status: Abnormal   Collection Time: 04/07/22  3:59 PM  Result Value Ref Range   Heparin Unfractionated 0.27 (L) 0.30 - 0.70 IU/mL    Comment: (NOTE) The clinical reportable range upper limit is being lowered to >1.10 to align with the FDA approved guidance for the current laboratory assay.  If heparin results are below expected values, and patient dosage has  been confirmed, suggest follow up testing of antithrombin III levels. Performed at [facility], [place], [place] [address]   Heparin level (unfractionated)     Status: None   Collection Time: 04/08/22 12:07 AM  Result Value Ref Range   Heparin Unfractionated 0.32 0.30 - 0.70 IU/mL    Comment: (NOTE) The clinical reportable range upper limit is being lowered to >1.10 to align with the FDA approved guidance for the current laboratory assay.  If heparin results are below expected values, and patient dosage has  been confirmed, suggest follow up testing of antithrombin III levels. Performed at [facility], [address] [place], [place] [address]   Procalcitonin     Status: None   Collection Time: 04/08/22  6:09 AM  Result Value Ref Range   Procalcitonin 2.16 ng/mL    Comment:        Interpretation: PCT > 2 ng/mL: Systemic infection (sepsis) is likely, unless other causes are known. (NOTE)       Sepsis PCT Algorithm  Lower Respiratory Tract                                       Infection PCT Algorithm    ----------------------------     ----------------------------         PCT < 0.25 ng/mL                PCT < 0.10 ng/mL          Strongly encourage             Strongly discourage   discontinuation of antibiotics    initiation of antibiotics    ----------------------------     -----------------------------       PCT 0.25 - 0.50 ng/mL            PCT 0.10 - 0.25 ng/mL               OR       >80% decrease in PCT            Discourage initiation of                                            antibiotics      Encourage discontinuation           of antibiotics    ----------------------------     -----------------------------         PCT >= 0.50 ng/mL              PCT 0.26 - 0.50 ng/mL               AND       <80% decrease in PCT              Encourage initiation of                                             antibiotics       Encourage continuation           of antibiotics    ----------------------------     -----------------------------        PCT >= 0.50 ng/mL                  PCT > 0.50 ng/mL               AND         increase in PCT                  Strongly encourage                                      initiation of antibiotics    Strongly encourage escalation           of antibiotics                                     -----------------------------  PCT <= 0.25 ng/mL                                                 OR                                        > 80% decrease in PCT                                      Discontinue / Do not initiate                                             antibiotics  Performed at [facility], [address] [place], [place] [address]   Basic metabolic panel     Status: Abnormal   Collection Time: 04/08/22  6:09 AM  Result Value Ref Range   Sodium 134 (L) 135 - 145 mmol/L   Potassium 3.7 3.5 - 5.1 mmol/L   Chloride 105 98 - 111 mmol/L   CO2 23 22 - 32  mmol/L   Glucose, Bld 117 (H) 70 - 99 mg/dL    Comment: Glucose reference range applies only to samples taken after fasting for at least 8 hours.   BUN 15 6 - 20 mg/dL   Creatinine, Ser 1.39 (H) 0.61 - 1.24 mg/dL   Calcium 8.4 (L) 8.9 - 10.3 mg/dL   GFR, Estimated >60 >60 mL/min    Comment: (NOTE) Calculated using the CKD-EPI Creatinine Equation (2021)    Anion gap 6 5 - 15    Comment: Performed at [facility], [address] [place], [place] [address]  Magnesium     Status: None   Collection Time: 04/08/22  6:09 AM  Result Value Ref Range   Magnesium 2.3 1.7 - 2.4 mg/dL    Comment: Performed at [facility], [address] [place], [place] [address]  Phosphorus     Status: Abnormal   Collection Time: 04/08/22  6:09 AM  Result Value Ref Range   Phosphorus 2.2 (L) 2.5 - 4.6 mg/dL    Comment: Performed at [facility], [address] [place], [place] [address]  CBC     Status: None   Collection Time: 04/08/22  6:09 AM  Result Value Ref Range   WBC 6.0 4.0 - 10.5 K/uL   RBC 4.70 4.22 - 5.81 MIL/uL   Hemoglobin 14.1 13.0 - 17.0 g/dL   HCT 43.5 39.0 - 52.0 %   MCV 92.6 80.0 - 100.0 fL   MCH 30.0 26.0 - 34.0 pg   MCHC 32.4 30.0 - 36.0 g/dL   RDW 14.9 11.5 - 15.5 %   Platelets 181 150 - 400 K/uL   nRBC 0.0 0.0 - 0.2 %    Comment: Performed at [facility], [address] [place], [place] [address]  Heparin level (unfractionated)     Status: Abnormal   Collection Time: 04/08/22  6:09 AM  Result Value Ref Range   Heparin Unfractionated 0.25 (L) 0.30 - 0.70 IU/mL    Comment: (  NOTE) The clinical reportable range upper limit is being lowered to >1.10 to align with the FDA approved guidance for the current laboratory assay.  If heparin results are below expected values, and patient dosage has  been confirmed, suggest follow up testing of antithrombin III levels. Performed at [facility], [address] [place], [place]  [address]   ECHOCARDIOGRAM COMPLETE     Status: None   Collection Time: 04/08/22  8:59 AM  Result Value Ref Range   Weight 5,731.96 oz   Height 67 in   BP 144/96 mmHg   Ao pk vel 1.64 m/s   AV Area VTI 3.14 cm2   AR max vel 2.71 cm2   AV Mean grad 5.0 mmHg   AV Peak grad 10.8 mmHg   S' Lateral 2.71 cm   AV Area mean vel 3.01 cm2   Area-P 1/2 3.76 cm2  Heparin level (unfractionated)     Status: Abnormal   Collection Time: 04/08/22  2:54 PM  Result Value Ref Range   Heparin Unfractionated 0.23 (L) 0.30 - 0.70 IU/mL    Comment: (NOTE) The clinical reportable range upper limit is being lowered to >1.10 to align with the FDA approved guidance for the current laboratory assay.  If heparin results are below expected values, and patient dosage has  been confirmed, suggest follow up testing of antithrombin III levels. Performed at [facility], [place], [place] [address]   Heparin level (unfractionated)     Status: Abnormal   Collection Time: 04/08/22  9:41 PM  Result Value Ref Range   Heparin Unfractionated 0.26 (L) 0.30 - 0.70 IU/mL    Comment: (NOTE) The clinical reportable range upper limit is being lowered to >1.10 to align with the FDA approved guidance for the current laboratory assay.  If heparin results are below expected values, and patient dosage has  been confirmed, suggest follow up testing of antithrombin III levels. Performed at [facility], [address] [place], [place] [address]   CBC     Status: None   Collection Time: 04/09/22  4:23 AM  Result Value Ref Range   WBC 4.3 4.0 - 10.5 K/uL   RBC 4.78 4.22 - 5.81 MIL/uL   Hemoglobin 14.0 13.0 - 17.0 g/dL   HCT 43.5 39.0 - 52.0 %   MCV 91.0 80.0 - 100.0 fL   MCH 29.3 26.0 - 34.0 pg   MCHC 32.2 30.0 - 36.0 g/dL   RDW 14.7 11.5 - 15.5 %   Platelets 215 150 - 400 K/uL   nRBC 0.0 0.0 - 0.2 %    Comment: Performed at [facility], [address] [place], [place]  [address]  Basic metabolic panel     Status: Abnormal   Collection Time: 04/09/22  4:23 AM  Result Value Ref Range   Sodium 136 135 - 145 mmol/L   Potassium 3.3 (L) 3.5 - 5.1 mmol/L   Chloride 105 98 - 111 mmol/L   CO2 25 22 - 32 mmol/L   Glucose, Bld 125 (H) 70 - 99 mg/dL    Comment: Glucose reference range applies only to samples taken after fasting for at least 8 hours.   BUN 16 6 - 20 mg/dL   Creatinine, Ser 1.29 (H) 0.61 - 1.24 mg/dL   Calcium 8.4 (L) 8.9 - 10.3 mg/dL   GFR, Estimated >60 >60 mL/min    Comment: (NOTE) Calculated using the CKD-EPI Creatinine Equation (2021)    Anion  gap 6 5 - 15    Comment: Performed at [facility], [place], [place] [address]  Heparin level (unfractionated)     Status: None   Collection Time: 04/09/22  4:23 AM  Result Value Ref Range   Heparin Unfractionated 0.34 0.30 - 0.70 IU/mL    Comment: (NOTE) The clinical reportable range upper limit is being lowered to >1.10 to align with the FDA approved guidance for the current laboratory assay.  If heparin results are below expected values, and patient dosage has  been confirmed, suggest follow up testing of antithrombin III levels. Performed at [facility], [place], [place] [address]   Heparin level (unfractionated)     Status: None   Collection Time: 04/09/22 11:06 AM  Result Value Ref Range   Heparin Unfractionated 0.38 0.30 - 0.70 IU/mL    Comment: (NOTE) The clinical reportable range upper limit is being lowered to >1.10 to align with the FDA approved guidance for the current laboratory assay.  If heparin results are below expected values, and patient dosage has  been confirmed, suggest follow up testing of antithrombin III levels. Performed at [facility], [address] [place], [place] [address]   CBC     Status: None   Collection Time: 04/10/22  5:32 AM  Result Value Ref Range   WBC 5.1 4.0 - 10.5 K/uL   RBC 4.81  4.22 - 5.81 MIL/uL   Hemoglobin 14.1 13.0 - 17.0 g/dL   HCT 44.0 39.0 - 52.0 %   MCV 91.5 80.0 - 100.0 fL   MCH 29.3 26.0 - 34.0 pg   MCHC 32.0 30.0 - 36.0 g/dL   RDW 14.6 11.5 - 15.5 %   Platelets 263 150 - 400 K/uL   nRBC 0.0 0.0 - 0.2 %    Comment: Performed at [facility], [address] [place], [place] [address]  Heparin level (unfractionated)     Status: Abnormal   Collection Time: 04/10/22  5:32 AM  Result Value Ref Range   Heparin Unfractionated 0.20 (L) 0.30 - 0.70 IU/mL    Comment: (NOTE) The clinical reportable range upper limit is being lowered to >1.10 to align with the FDA approved guidance for the current laboratory assay.  If heparin results are below expected values, and patient dosage has  been confirmed, suggest follow up testing of antithrombin III levels. Performed at [facility], [address] [place], [place] [address]      PHQ2/9:    03/18/2022    2:12 PM 02/03/2022    8:38 AM 10/29/2021    8:59 AM 07/29/2021    1:37 PM 01/18/2017   12:16 PM  Depression screen PHQ 2/9  Decreased Interest 0 0 0 0 0  Down, Depressed, Hopeless 0 0 0 0 0  PHQ - 2 Score 0 0 0 0 0  Altered sleeping 0 0 0 0   Tired, decreased energy 0 0 0 0   Change in appetite 0 0 0 0   Feeling bad or failure about yourself  0 0 0 0   Trouble concentrating 0 0 0 0   Moving slowly or fidgety/restless 0 0 0 0   Suicidal thoughts 0 0 0 0   PHQ-9 Score 0 0 0 0   Difficult doing work/chores Not difficult at all Not difficult at all Not difficult at all Not difficult at all       Fall Risk:  03/18/2022    2:11 PM 02/03/2022    8:38 AM 10/29/2021    8:59 AM 07/29/2021    1:37 PM 01/18/2017   12:16 PM  Fall Risk   Falls in the past year? 0 0 0 0 No  Number falls in past yr: 0 0 0 0   Injury with Fall? 0 0 0 0   Risk for fall due to : No Fall Risks No Fall Risks No Fall Risks No Fall Risks   Follow up Falls evaluation completed Falls evaluation completed Falls  evaluation completed Falls evaluation completed       Functional Status Survey:      Assessment & Plan  Problem List Items Addressed This Visit       Cardiovascular and Mediastinum   VTE (venous thromboembolism) - Primary    Acute, recurrent per hospital discharge summary Pt is on Xarelto - review of hematology notes demonstrates concern for stable medication dose after labs Reviewed importance of taking as directed to help prevent further clotting issues Recommend he follow up with hematology to have repeat labs for monitoring  medication levels Follow up as needed         Respiratory   Sepsis with acute hypoxic respiratory failure (HCC)    Acute, recurrent, appears resolved at this time Unsure of source of infection from hospital discharge information Patient is taking Keflex for cellulitis of right lower leg - continue this for resolution Will draw CBC and CMP for monitoring       Relevant Orders   CBC w/Diff   Comp Met (CMET)     Other   Cellulitis of right lower leg    Acute, new concern Right calf is swollen and red compared to left leg Patient is on Xarelto and keflex from hospital admission Recommend he continue with Kelfex for cellulitis resolution Recommend Tylenol as needed for pain Follow up as needed for persistent or progressing symptoms       Relevant Orders   CBC w/Diff   Comp Met (CMET)     No follow-ups on file.   I, Erin E Mecum, PA-C, have reviewed all documentation for this visit. The documentation on 04/12/22 for the exam, diagnosis, procedures, and orders are all accurate and complete.   Talitha Givens, MHS, PA-C [facility]

## 2022-04-12 NOTE — Assessment & Plan Note (Signed)
Acute, recurrent per hospital discharge summary Pt is on Xarelto - review of hematology notes demonstrates concern for stable medication dose after labs Reviewed importance of taking as directed to help prevent further clotting issues Recommend he follow up with hematology to have repeat labs for monitoring  medication levels Follow up as needed

## 2022-04-13 LAB — CBC WITH DIFFERENTIAL/PLATELET
Basophils Absolute: 0 10*3/uL (ref 0.0–0.2)
Basos: 1 %
EOS (ABSOLUTE): 0.2 10*3/uL (ref 0.0–0.4)
Eos: 4 %
Hematocrit: 43.5 % (ref 37.5–51.0)
Hemoglobin: 14.6 g/dL (ref 13.0–17.7)
Immature Grans (Abs): 0 10*3/uL (ref 0.0–0.1)
Immature Granulocytes: 1 %
Lymphocytes Absolute: 1.8 10*3/uL (ref 0.7–3.1)
Lymphs: 33 %
MCH: 30.2 pg (ref 26.6–33.0)
MCHC: 33.6 g/dL (ref 31.5–35.7)
MCV: 90 fL (ref 79–97)
Monocytes Absolute: 0.8 10*3/uL (ref 0.1–0.9)
Monocytes: 14 %
Neutrophils Absolute: 2.6 10*3/uL (ref 1.4–7.0)
Neutrophils: 47 %
Platelets: 303 10*3/uL (ref 150–450)
RBC: 4.83 x10E6/uL (ref 4.14–5.80)
RDW: 13.6 % (ref 11.6–15.4)
WBC: 5.5 10*3/uL (ref 3.4–10.8)

## 2022-04-13 LAB — COMPREHENSIVE METABOLIC PANEL
ALT: 44 IU/L (ref 0–44)
AST: 47 IU/L — ABNORMAL HIGH (ref 0–40)
Albumin/Globulin Ratio: 1.1 — ABNORMAL LOW (ref 1.2–2.2)
Albumin: 3.9 g/dL (ref 3.8–4.9)
Alkaline Phosphatase: 65 IU/L (ref 44–121)
BUN/Creatinine Ratio: 16 (ref 9–20)
BUN: 21 mg/dL (ref 6–24)
Bilirubin Total: 0.3 mg/dL (ref 0.0–1.2)
CO2: 18 mmol/L — ABNORMAL LOW (ref 20–29)
Calcium: 9.1 mg/dL (ref 8.7–10.2)
Chloride: 101 mmol/L (ref 96–106)
Creatinine, Ser: 1.35 mg/dL — ABNORMAL HIGH (ref 0.76–1.27)
Globulin, Total: 3.4 g/dL (ref 1.5–4.5)
Glucose: 141 mg/dL — ABNORMAL HIGH (ref 70–99)
Potassium: 4.4 mmol/L (ref 3.5–5.2)
Sodium: 137 mmol/L (ref 134–144)
Total Protein: 7.3 g/dL (ref 6.0–8.5)
eGFR: 63 mL/min/{1.73_m2} (ref >59)

## 2022-04-15 ENCOUNTER — Encounter: Payer: Self-pay | Admitting: Nurse Practitioner

## 2022-04-21 ENCOUNTER — Telehealth: Payer: Self-pay

## 2022-04-21 NOTE — Telephone Encounter (Signed)
Attempted to call patient regarding FMLA paperwork that has been received, needing information to fill the forms out. [facility].

## 2022-04-23 ENCOUNTER — Ambulatory Visit: Payer: No Typology Code available for payment source

## 2022-04-28 DIAGNOSIS — R768 Other specified abnormal immunological findings in serum: Secondary | ICD-10-CM | POA: Insufficient documentation

## 2022-05-12 ENCOUNTER — Encounter: Payer: Self-pay | Admitting: Nurse Practitioner

## 2022-05-17 ENCOUNTER — Telehealth: Payer: Self-pay | Admitting: Nurse Practitioner

## 2022-05-17 NOTE — Telephone Encounter (Signed)
Okay to stop the medication

## 2022-05-17 NOTE — Telephone Encounter (Signed)
Patient called in states that he's been in and out of the hosp with blood poisoning, he is good now, but his Rheumotology Dr thinks it may be the  Ryan [name] med and wants him to stop taking it

## 2022-05-17 NOTE — Telephone Encounter (Signed)
Pt voiced understanding

## 2022-06-14 NOTE — Progress Notes (Deleted)
   There were no vitals taken for this visit.   Subjective:    Patient ID: Ryan [name], male    DOB: 03-13-1971, 51 y.o.   MRN: [id number]  HPI: Ryan [name] is a 51 y.o. male  No chief complaint on file.  HYPERTENSION / HYPERLIPIDEMIA Satisfied with current treatment? {Blank single:19197::"yes","no"} Duration of hypertension: {Blank single:19197::"chronic","months","years"} BP monitoring frequency: {Blank single:19197::"not checking","rarely","daily","weekly","monthly","a few times a day","a few times a week","a few times a month"} BP range:  BP medication side effects: {Blank single:19197::"yes","no"} Past BP meds: {Blank BSJGGEZM:62947::"MLYY","TKPTWSFKCL","EXNTZGYFVC/BSWHQPRFFM","BWGYKZLD","JTTSVXBLTJ","QZESPQZRAQ/TMAU","QJFHLKTGYB (bystolic)","carvedilol","chlorthalidone","clonidine","diltiazem","exforge HCT","HCTZ","irbesartan (avapro)","labetalol","lisinopril","lisinopril-HCTZ","losartan (cozaar)","methyldopa","nifedipine","olmesartan (benicar)","olmesartan-HCTZ","quinapril","ramipril","spironalactone","tekturna","valsartan","valsartan-HCTZ","verapamil"} Duration of hyperlipidemia: {Blank single:19197::"chronic","months","years"} Cholesterol medication side effects: {Blank single:19197::"yes","no"} Cholesterol supplements: {Blank multiple:19196::"none","fish oil","niacin","red yeast rice"} Past cholesterol medications: {Blank multiple:19196::"none","atorvastain (lipitor)","lovastatin (mevacor)","pravastatin (pravachol)","rosuvastatin (crestor)","simvastatin (zocor)","vytorin","fenofibrate (tricor)","gemfibrozil","ezetimide (zetia)","niaspan","lovaza"} Medication compliance: {Blank single:19197::"excellent compliance","good compliance","fair compliance","poor compliance"} Aspirin: {Blank single:19197::"yes","no"} Recent stressors: {Blank single:19197::"yes","no"} Recurrent headaches: {Blank single:19197::"yes","no"} Visual changes: {Blank single:19197::"yes","no"} Palpitations:  {Blank single:19197::"yes","no"} Dyspnea: {Blank single:19197::"yes","no"} Chest pain: {Blank single:19197::"yes","no"} Lower extremity edema: {Blank single:19197::"yes","no"} Dizzy/lightheaded: {Blank single:19197::"yes","no"}  DIABETES Hypoglycemic episodes:{Blank single:19197::"yes","no"} Polydipsia/polyuria: {Blank single:19197::"yes","no"} Visual disturbance: {Blank single:19197::"yes","no"} Chest pain: {Blank single:19197::"yes","no"} Paresthesias: {Blank single:19197::"yes","no"} Glucose Monitoring: {Blank single:19197::"yes","no"}  Accucheck frequency: {Blank single:19197::"Not Checking","Daily","BID","TID"}  Fasting glucose:  Post prandial:  Evening:  Before meals: Taking Insulin?: {Blank single:19197::"yes","no"}  Long acting insulin:  Short acting insulin: Blood Pressure Monitoring: {Blank single:19197::"not checking","rarely","daily","weekly","monthly","a few times a day","a few times a week","a few times a month"} Retinal Examination: {Blank single:19197::"Up to Date","Not up to Date"} Foot Exam: {Blank single:19197::"Up to Date","Not up to Date"} Diabetic Education: {Blank single:19197::"Completed","Not Completed"} Pneumovax: {Blank single:19197::"Up to Date","Not up to Date","unknown"} Influenza: {Blank single:19197::"Up to Date","Not up to Date","unknown"} Aspirin: {Blank single:19197::"yes","no"}  CHRONIC KIDNEY DISEASE CKD status: {Blank single:19197::"controlled","uncontrolled","better","worse","exacerbated","stable"} Medications renally dose: {Blank single:19197::"yes","no"} Previous renal evaluation: {Blank single:19197::"yes","no"} Pneumovax:  {Blank single:19197::"Up to Date","Not up to Date","unknown"} Influenza Vaccine:  {Blank single:19197::"Up to Date","Not up to Date","unknown"}  Relevant past medical, surgical, family and social history reviewed and updated as indicated. Interim medical history since our last visit reviewed. Allergies and medications  reviewed and updated.  Review of Systems  Per HPI unless specifically indicated above     Objective:    There were no vitals taken for this visit.  Wt Readings from Last 3 Encounters:  04/12/22 (!) 351 lb 6.4 oz (159.4 kg)  04/11/22 (!) 349 lb 3.3 oz (158.4 kg)  03/18/22 (!) 355 lb 9.6 oz (161.3 kg)    Physical Exam  Results for orders placed or performed in visit on 04/15/22  HM DIABETES EYE EXAM  Result Value Ref Range   HM Diabetic Eye Exam Retinopathy (A) No Retinopathy      Assessment & Plan:   Problem List Items Addressed This Visit   None    Follow up plan: No follow-ups on file.

## 2022-06-15 ENCOUNTER — Ambulatory Visit: Payer: No Typology Code available for payment source | Admitting: Nurse Practitioner

## 2022-06-18 ENCOUNTER — Ambulatory Visit: Payer: No Typology Code available for payment source | Admitting: Nurse Practitioner

## 2022-06-28 ENCOUNTER — Encounter (INDEPENDENT_AMBULATORY_CARE_PROVIDER_SITE_OTHER): Payer: Self-pay

## 2022-06-29 ENCOUNTER — Ambulatory Visit: Payer: No Typology Code available for payment source | Admitting: Nurse Practitioner

## 2022-06-29 NOTE — Progress Notes (Deleted)
   There were no vitals taken for this visit.   Subjective:    Patient ID: Jourden A Maners, male    DOB: 07/03/1971, 51 y.o.   MRN: [id number]  HPI: Kishawn A Dantonio is a 51 y.o. male  No chief complaint on file.  HYPERTENSION / HYPERLIPIDEMIA Satisfied with current treatment? {Blank single:19197::"yes","no"} Duration of hypertension: {Blank single:19197::"chronic","months","years"} BP monitoring frequency: {Blank single:19197::"not checking","rarely","daily","weekly","monthly","a few times a day","a few times a week","a few times a month"} BP range:  BP medication side effects: {Blank single:19197::"yes","no"} Past BP meds: {Blank multiple:19196::"none","amlodipine","amlodipine/benazepril","atenolol","benazepril","benazepril/HCTZ","bisoprolol (bystolic)","carvedilol","chlorthalidone","clonidine","diltiazem","exforge HCT","HCTZ","irbesartan (avapro)","labetalol","lisinopril","lisinopril-HCTZ","losartan (cozaar)","methyldopa","nifedipine","olmesartan (benicar)","olmesartan-HCTZ","quinapril","ramipril","spironalactone","tekturna","valsartan","valsartan-HCTZ","verapamil"} Duration of hyperlipidemia: {Blank single:19197::"chronic","months","years"} Cholesterol medication side effects: {Blank single:19197::"yes","no"} Cholesterol supplements: {Blank multiple:19196::"none","fish oil","niacin","red yeast rice"} Past cholesterol medications: {Blank multiple:19196::"none","atorvastain (lipitor)","lovastatin (mevacor)","pravastatin (pravachol)","rosuvastatin (crestor)","simvastatin (zocor)","vytorin","fenofibrate (tricor)","gemfibrozil","ezetimide (zetia)","niaspan","lovaza"} Medication compliance: {Blank single:19197::"excellent compliance","good compliance","fair compliance","poor compliance"} Aspirin: {Blank single:19197::"yes","no"} Recent stressors: {Blank single:19197::"yes","no"} Recurrent headaches: {Blank single:19197::"yes","no"} Visual changes: {Blank single:19197::"yes","no"} Palpitations:  {Blank single:19197::"yes","no"} Dyspnea: {Blank single:19197::"yes","no"} Chest pain: {Blank single:19197::"yes","no"} Lower extremity edema: {Blank single:19197::"yes","no"} Dizzy/lightheaded: {Blank single:19197::"yes","no"}  DIABETES Hypoglycemic episodes:{Blank single:19197::"yes","no"} Polydipsia/polyuria: {Blank single:19197::"yes","no"} Visual disturbance: {Blank single:19197::"yes","no"} Chest pain: {Blank single:19197::"yes","no"} Paresthesias: {Blank single:19197::"yes","no"} Glucose Monitoring: {Blank single:19197::"yes","no"}  Accucheck frequency: {Blank single:19197::"Not Checking","Daily","BID","TID"}  Fasting glucose:  Post prandial:  Evening:  Before meals: Taking Insulin?: {Blank single:19197::"yes","no"}  Long acting insulin:  Short acting insulin: Blood Pressure Monitoring: {Blank single:19197::"not checking","rarely","daily","weekly","monthly","a few times a day","a few times a week","a few times a month"} Retinal Examination: {Blank single:19197::"Up to Date","Not up to Date"} Foot Exam: {Blank single:19197::"Up to Date","Not up to Date"} Diabetic Education: {Blank single:19197::"Completed","Not Completed"} Pneumovax: {Blank single:19197::"Up to Date","Not up to Date","unknown"} Influenza: {Blank single:19197::"Up to Date","Not up to Date","unknown"} Aspirin: {Blank single:19197::"yes","no"}  CHRONIC KIDNEY DISEASE CKD status: {Blank single:19197::"controlled","uncontrolled","better","worse","exacerbated","stable"} Medications renally dose: {Blank single:19197::"yes","no"} Previous renal evaluation: {Blank single:19197::"yes","no"} Pneumovax:  {Blank single:19197::"Up to Date","Not up to Date","unknown"} Influenza Vaccine:  {Blank single:19197::"Up to Date","Not up to Date","unknown"}  Relevant past medical, surgical, family and social history reviewed and updated as indicated. Interim medical history since our last visit reviewed. Allergies and medications  reviewed and updated.  Review of Systems  Per HPI unless specifically indicated above     Objective:    There were no vitals taken for this visit.  Wt Readings from Last 3 Encounters:  04/12/22 (!) 351 lb 6.4 oz (159.4 kg)  04/11/22 (!) 349 lb 3.3 oz (158.4 kg)  03/18/22 (!) 355 lb 9.6 oz (161.3 kg)    Physical Exam  Results for orders placed or performed in visit on 04/15/22  HM DIABETES EYE EXAM  Result Value Ref Range   HM Diabetic Eye Exam Retinopathy (A) No Retinopathy      Assessment & Plan:   Problem List Items Addressed This Visit   None    Follow up plan: No follow-ups on file.      

## 2022-07-04 ENCOUNTER — Emergency Department
Admission: EM | Admit: 2022-07-04 | Discharge: 2022-07-04 | Disposition: A | Discharge status: Home / Self Care | Payer: No Typology Code available for payment source | Attending: Emergency Medicine | Admitting: Emergency Medicine

## 2022-07-04 ENCOUNTER — Emergency Department: Payer: No Typology Code available for payment source

## 2022-07-04 DIAGNOSIS — I2724 Chronic thromboembolic pulmonary hypertension: Secondary | ICD-10-CM | POA: Insufficient documentation

## 2022-07-04 DIAGNOSIS — E1122 Type 2 diabetes mellitus with diabetic chronic kidney disease: Secondary | ICD-10-CM | POA: Diagnosis not present

## 2022-07-04 DIAGNOSIS — N1831 Chronic kidney disease, stage 3a: Secondary | ICD-10-CM | POA: Diagnosis not present

## 2022-07-04 DIAGNOSIS — I130 Hypertensive heart and chronic kidney disease with heart failure and stage 1 through stage 4 chronic kidney disease, or unspecified chronic kidney disease: Secondary | ICD-10-CM | POA: Diagnosis not present

## 2022-07-04 DIAGNOSIS — Z7901 Long term (current) use of anticoagulants: Secondary | ICD-10-CM | POA: Diagnosis not present

## 2022-07-04 DIAGNOSIS — Z20822 Contact with and (suspected) exposure to covid-19: Secondary | ICD-10-CM | POA: Insufficient documentation

## 2022-07-04 DIAGNOSIS — I5032 Chronic diastolic (congestive) heart failure: Secondary | ICD-10-CM | POA: Diagnosis not present

## 2022-07-04 DIAGNOSIS — R0602 Shortness of breath: Secondary | ICD-10-CM | POA: Diagnosis present

## 2022-07-04 LAB — CBC WITH DIFFERENTIAL/PLATELET
Abs Immature Granulocytes: 0.01 10*3/uL (ref 0.00–0.07)
Basophils Absolute: 0 10*3/uL (ref 0.0–0.1)
Basophils Relative: 1 %
Eosinophils Absolute: 0.2 10*3/uL (ref 0.0–0.5)
Eosinophils Relative: 6 %
HCT: 44.4 % (ref 39.0–52.0)
Hemoglobin: 14.2 g/dL (ref 13.0–17.0)
Immature Granulocytes: 0 %
Lymphocytes Relative: 38 %
Lymphs Abs: 1.6 10*3/uL (ref 0.7–4.0)
MCH: 29.4 pg (ref 26.0–34.0)
MCHC: 32 g/dL (ref 30.0–36.0)
MCV: 91.9 fL (ref 80.0–100.0)
Monocytes Absolute: 0.6 10*3/uL (ref 0.1–1.0)
Monocytes Relative: 14 %
Neutro Abs: 1.8 10*3/uL (ref 1.7–7.7)
Neutrophils Relative %: 41 %
Platelets: 242 10*3/uL (ref 150–400)
RBC: 4.83 MIL/uL (ref 4.22–5.81)
RDW: 15.3 % (ref 11.5–15.5)
WBC: 4.2 10*3/uL (ref 4.0–10.5)
nRBC: 0 % (ref 0.0–0.2)

## 2022-07-04 LAB — COMPREHENSIVE METABOLIC PANEL
ALT: 19 U/L (ref 0–44)
AST: 18 U/L (ref 15–41)
Albumin: 3.8 g/dL (ref 3.5–5.0)
Alkaline Phosphatase: 54 U/L (ref 38–126)
Anion gap: 5 (ref 5–15)
BUN: 17 mg/dL (ref 6–20)
CO2: 24 mmol/L (ref 22–32)
Calcium: 8.5 mg/dL — ABNORMAL LOW (ref 8.9–10.3)
Chloride: 112 mmol/L — ABNORMAL HIGH (ref 98–111)
Creatinine, Ser: 1.49 mg/dL — ABNORMAL HIGH (ref 0.61–1.24)
GFR, Estimated: 56 mL/min — ABNORMAL LOW (ref >60)
Glucose, Bld: 113 mg/dL — ABNORMAL HIGH (ref 70–99)
Potassium: 4 mmol/L (ref 3.5–5.1)
Sodium: 141 mmol/L (ref 135–145)
Total Bilirubin: 0.6 mg/dL (ref 0.3–1.2)
Total Protein: 7.7 g/dL (ref 6.5–8.1)

## 2022-07-04 LAB — BRAIN NATRIURETIC PEPTIDE: B Natriuretic Peptide: 74.8 pg/mL (ref 0.0–100.0)

## 2022-07-04 LAB — TROPONIN I (HIGH SENSITIVITY)
Troponin I (High Sensitivity): 20 ng/L — ABNORMAL HIGH (ref <18)
Troponin I (High Sensitivity): 18 ng/L — ABNORMAL HIGH (ref <18)

## 2022-07-04 LAB — RESP PANEL BY RT-PCR (FLU A&B, COVID) ARPGX2
Influenza A by PCR: NEGATIVE
Influenza B by PCR: NEGATIVE
SARS Coronavirus 2 by RT PCR: NEGATIVE

## 2022-07-04 MED ORDER — IOHEXOL 350 MG/ML SOLN
100.0000 mL | Freq: Once | INTRAVENOUS | Status: AC | PRN
  Administered 2022-07-04: 100 mL via INTRAVENOUS

## 2022-07-04 MED ORDER — FUROSEMIDE 10 MG/ML IJ SOLN
40.0000 mg | Freq: Once | INTRAMUSCULAR | Status: AC
  Administered 2022-07-04: 40 mg via INTRAVENOUS
  Filled 2022-07-04: qty 4

## 2022-07-04 NOTE — Discharge Instructions (Signed)
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  Is very important that you continue to take your Xarelto every day.  Is also important that you take your Lasix on a daily basis.  Please follow-up with your pulmonologist.  I would call the office tomorrow to schedule an appointment to be seen as this diagnosis of pulmonary hypertension does require specific treatment.

## 2022-07-04 NOTE — ED Provider Notes (Signed)
[facility] Provider Note    None    (approximate)   History   Shortness of Breath   HPI  Ryan [name] is a 51 y.o. male  with pmh HTN, pre-DM, with recurrent DVT/PE s/p IVC filter x2 (2005, 2012 on Xarelto, dCHF,, PVD, CKD stage IIIa, atrial fibrillation (s/p of ablation) who p/w SOB.  Symptoms started at 12 AM.  Prior to this time he was feeling his normal self.  He endorses.  Shortness of breath he denies any associated pain including any chest pain or pleuritic pain.  Has had mild cough but not significant no sputum production.  Denies fevers or chills.  Denies new leg swelling or pain.  Patient has history of PE and has an IVC filter and takes Xarelto.  Has missed about 2 doses in the last 2 weeks.  Also has a history of CHF.  Supposed to be taking Lasix but she has not been taking for the last month because it makes him pee so much.   Last echo from 03/2022 showed normal ejection fraction no regional wall motion abnormalities with normal diastolic parameters.    Past Medical History:  Diagnosis Date   CHF (congestive heart failure) ([place])    Diabetes mellitus without complication ([place])    per pt-pre diabetic-Dr Rosario Jacks stated said pt   DVT (deep venous thrombosis) ([place])    Hepatic steatosis    Hypertension    Morbid obesity with BMI of 50.0-59.9, adult ([place])    Peripheral vascular disease ([place])    Presence of IVC filter    Ventricular trigeminy    Weakness of both lower limbs     Patient Active Problem List   Diagnosis Date Noted   Cellulitis of right lower leg 04/12/2022   Sepsis ([place]) 04/06/2022   Community acquired bacterial pneumonia 02/20/2022   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 ([place]) 02/09/2022   Lymphadenopathy 02/09/2022   Physical deconditioning 01/27/2022   At risk for sleep apnea 01/27/2022   VTE (venous thromboembolism) 01/26/2022   Sepsis with acute hypoxic respiratory failure (HCC)    Chronic diastolic CHF  (congestive heart failure) ([place]) 01/04/2022   Acute respiratory failure with hypoxia, recurrent, in setting of previous treatment for pneumonia, relapsing/remitting fever, no previous work-up for malignant or autoimmune cause or unusual infection 01/01/2022   Severe sepsis ([place]) 01/01/2022   Clotting disorder ([place]) 10/19/2021   Angina at rest 07/29/2021   Hyperlipidemia associated with type 2 diabetes mellitus ([place]) 07/29/2021   Cellulitis of right foot 06/03/2021   Atrial fibrillation with rapid ventricular response ([place]) 10/14/2020   Atrial flutter with rapid ventricular response ([place]) 10/01/2020   Knee pain 03/11/2020   Weakness of both lower limbs 11/17/2017   Type 2 diabetes mellitus with diabetic chronic kidney disease ([place]) 08/03/2017   CKD (chronic kidney disease), stage IIIa 08/03/2017   Hypertension 12/15/2016   Fatigue 12/15/2016   PVC (premature ventricular contraction) 11/24/2016   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   History of pulmonary embolism and DVT 12/28/2014   Hepatic steatosis 05/30/2013   Morbid obesity with BMI of 50.0-59.9, adult ([place]) 05/30/2013   Proteinuria 05/30/2013     Physical Exam  Triage Vital Signs: ED Triage Vitals  Enc Vitals Group     BP 07/04/22 0622 (!) 171/102     Pulse Rate 07/04/22 0622 65     Resp 07/04/22 0622 (!) 28     Temp 07/04/22 0622 98.6 F (37 C)  Temp Source 07/04/22 0622 Oral     SpO2 07/04/22 0622 93 %     Weight 07/04/22 0624 (!) 360 lb (163.3 kg)     Height 07/04/22 0624 5\' 7"  (1.702 m)     Head Circumference --      Peak Flow --      Pain Score 07/04/22 0624 0     Pain Loc --      Pain Edu? --      Excl. in GC? --     Most recent vital signs: Vitals:   07/04/22 0853 07/04/22 0856  BP:    Pulse: 61 87  Resp: (!) 25   Temp:    SpO2: 94% 95%     General: Awake, no distress.  CV:  Good peripheral perfusion.  Chronic stasis changes in the bilateral lower extremities, they are symmetric no pitting  edema Resp:  Patient is mildly tachypneic lungs are clear Abd:  No distention.  Neuro:             Awake, Alert, Oriented x 3  Other:     ED Results / Procedures / Treatments  Labs (all labs ordered are listed, but only abnormal results are displayed) Labs Reviewed  COMPREHENSIVE METABOLIC PANEL - Abnormal; Notable for the following components:      Result Value   Chloride 112 (*)    Glucose, Bld 113 (*)    Creatinine, Ser 1.49 (*)    Calcium 8.5 (*)    GFR, Estimated 56 (*)    All other components within normal limits  TROPONIN I (HIGH SENSITIVITY) - Abnormal; Notable for the following components:   Troponin I (High Sensitivity) 20 (*)    All other components within normal limits  TROPONIN I (HIGH SENSITIVITY) - Abnormal; Notable for the following components:   Troponin I (High Sensitivity) 18 (*)    All other components within normal limits  RESP PANEL BY RT-PCR (FLU A&B, COVID) ARPGX2  CBC WITH DIFFERENTIAL/PLATELET  BRAIN NATRIURETIC PEPTIDE     EKG  EKG interpretation performed by myself: NSR, nml axis, nml intervals, low voltage, no acute ischemic changes    RADIOLOGY I reviewed and interpreted the chest x-ray which shows low volumes pulmonary vascular congestion   PROCEDURES:  Critical Care performed: Yes, see critical care procedure note(s)  .Critical Care  Performed by: 13/05/23, MD Authorized by: Georga Hacking, MD   Critical care provider statement:    Critical care time (minutes):  30   Critical care was time spent personally by me on the following activities:  Development of treatment plan with patient or surrogate, discussions with consultants, evaluation of patient's response to treatment, examination of patient, ordering and review of laboratory studies, ordering and review of radiographic studies, ordering and performing treatments and interventions, pulse oximetry, re-evaluation of patient's condition and review of old charts   The  patient is on the cardiac monitor to evaluate for evidence of arrhythmia and/or significant heart rate changes.   MEDICATIONS ORDERED IN ED: Medications  furosemide (LASIX) injection 40 mg (40 mg Intravenous Given 07/04/22 0718)  iohexol (OMNIPAQUE) 350 MG/ML injection 100 mL (100 mLs Intravenous Contrast Given 07/04/22 0733)     IMPRESSION / MDM / ASSESSMENT AND PLAN / ED COURSE  I reviewed the triage vital signs and the nursing notes.  Patient's presentation is most consistent with acute presentation with potential threat to life or bodily function.  Differential diagnosis includes, but is not limited to, recurrent pulmonary embolism, pulmonary edema, pneumonia, pneumothorax, acute coronary syndrome, pulmonary hypertension  The patient is a 51 year old male past medical history of recurrent DVT/PE CHF who presents because of shortness of breath that started acutely around 12 AM 7 hours ago.  He is mildly tachypneic sats initially 93% documented in triage when I am in the room he is satting about 95% on room air.  He is mildly tachypneic lungs sound clear.  Denies any new lower extremity edema.  He has missed 2 doses of Xarelto in the last 2 weeks is also not been taking his Lasix for the past month because of polyuria.  He is mildly hypertensive as well.  Chest x-ray shows some mild pulmonary vascular congestion without overt pulmonary edema there is poor inspiratory effort.  His EKG shows low voltage nonischemic.  Given his history of recurrent PE and questionable compliance with Xarelto will obtain CTA.  This could all be pulmonary vascular congestion or some exacerbation pulmonary hypertension so we will give a dose of Lasix.    Patient's labs are overall reassuring.  First troponin is 20 and repeat is 18 not significantly different.  BNP is normal.  CBC and CMP are stable.  Patient's chest x-ray does have some vascular congestion.  Patient was given a dose of 40  mg of Lasix and had significant urine output.  CT angio does not have any acute PE however there are findings that suggest chronic thromboembolic pulmonary hypertension, including dilation of the main pulmonary artery reflux of contrast into the IVC and chronic thrombi.  I did discuss with pulmonologist Dr. Belia Heman on-call to see if he recommends admission for further management of this diagnosis alone.  He recommended an ambulatory pulse ox trial and if patient is not requiring oxygen this can be managed as an outpatient.  Patient ambulated and pulse ox stayed above 95%.  On repeat assessment he says he is feeling much improved after the Lasix.  I encouraged that he continue to take the home Lasix that he is prescribed.  Did discuss diagnosis of chronic pulmonary hypertension related to his underlying chronic PEs with him.  He has a pulmonologist at [facility].  Recommend he call him tomorrow to schedule an appointment for follow-up.  Given patient is feeling improved is not requiring oxygen he is otherwise appropriate for discharge.   FINAL CLINICAL IMPRESSION(S) / ED DIAGNOSES   Final diagnoses:  Chronic thromboembolic pulmonary hypertension (HCC)     Rx / DC Orders   ED Discharge Orders     None        Note:  This document was prepared using Dragon voice recognition software and may include unintentional dictation errors.   Georga Hacking, MD 07/04/22 [phone]

## 2022-07-04 NOTE — ED Notes (Signed)
Pt ambulated in room with O2 sats remaining above 93%

## 2022-07-04 NOTE — ED Triage Notes (Signed)
Pt from home for SOB starting at 0000. Pt reports Hx of PE, DVT, and SOB for 33 years.  Pt denies any pain.

## 2022-07-05 ENCOUNTER — Telehealth: Payer: Self-pay

## 2022-07-05 NOTE — Telephone Encounter (Signed)
Transition Care Management Follow-up Telephone Call Date of discharge and from where: 07/04/2022, [facility] How have you been since you were released from the hospital? [place] Any questions or concerns? No  Items Reviewed: Did the pt receive and understand the discharge instructions provided? Yes  Medications obtained and verified? No  Other? No  Any new allergies since your discharge? No  Dietary orders reviewed? No Do you have support at home? Yes   Home Care and Equipment/Supplies: Were home health services ordered? no  Has the agency set up a time to come to the patient's home? not applicable Were any new equipment or medical supplies ordered?  No What is the name of the medical supply agency?  Were you able to get the supplies/equipment? not applicable Do you have any questions related to the use of the equipment or supplies? No  Functional Questionnaire: (I = Independent and D = Dependent) ADLs: I  Bathing/Dressing- I  Meal Prep- I  Eating- I  Maintaining continence- I  Transferring/Ambulation- I  Managing Meds- I  Follow up appointments reviewed:  PCP Hospital f/u appt confirmed? Yes  Scheduled to see Jon Billings NP on 07/15/2022 @ 2:40 PM. [facility] f/u appt confirmed? No   Are transportation arrangements needed? No  If their condition worsens, is the pt aware to call PCP or go to the Emergency Dept.? Yes Was the patient provided with contact information for the PCP's office or ED? Yes Was to pt encouraged to call back with questions or concerns? Yes

## 2022-07-15 ENCOUNTER — Encounter: Payer: Self-pay | Admitting: Nurse Practitioner

## 2022-07-15 ENCOUNTER — Ambulatory Visit (INDEPENDENT_AMBULATORY_CARE_PROVIDER_SITE_OTHER): Payer: No Typology Code available for payment source | Admitting: Nurse Practitioner

## 2022-07-15 VITALS — BP 135/88 | HR 84 | Temp 98.7°F | Wt 364.8 lb

## 2022-07-15 DIAGNOSIS — D689 Coagulation defect, unspecified: Secondary | ICD-10-CM

## 2022-07-15 DIAGNOSIS — I10 Essential (primary) hypertension: Secondary | ICD-10-CM

## 2022-07-15 DIAGNOSIS — I5032 Chronic diastolic (congestive) heart failure: Secondary | ICD-10-CM | POA: Diagnosis not present

## 2022-07-15 DIAGNOSIS — E1169 Type 2 diabetes mellitus with other specified complication: Secondary | ICD-10-CM

## 2022-07-15 DIAGNOSIS — Z6841 Body Mass Index (BMI) 40.0 and over, adult: Secondary | ICD-10-CM

## 2022-07-15 DIAGNOSIS — I4891 Unspecified atrial fibrillation: Secondary | ICD-10-CM

## 2022-07-15 DIAGNOSIS — E785 Hyperlipidemia, unspecified: Secondary | ICD-10-CM

## 2022-07-15 DIAGNOSIS — E1122 Type 2 diabetes mellitus with diabetic chronic kidney disease: Secondary | ICD-10-CM

## 2022-07-15 DIAGNOSIS — N1831 Chronic kidney disease, stage 3a: Secondary | ICD-10-CM

## 2022-07-15 MED ORDER — TIRZEPATIDE 2.5 MG/0.5ML ~~LOC~~ SOAJ: 2.5000 mg | SUBCUTANEOUS | 0 refills | Status: DC

## 2022-07-15 NOTE — Assessment & Plan Note (Signed)
Chronic.  Controlled.  Continue with current medication regimen.  Labs ordered today.  Return to clinic in 3 months for reevaluation.  Call sooner if concerns arise.   

## 2022-07-15 NOTE — Assessment & Plan Note (Signed)
Chronic.  Controlled.  Continue with current medication regimen of metoprolol, Lasix and Losartan.  Labs ordered today.  Return to clinic in 6 months for reevaluation.  Call sooner if concerns arise.

## 2022-07-15 NOTE — Assessment & Plan Note (Signed)
Chronic.  Controlled.  Not currently on any medication due to Rheumatology recommending stopping Jardiance.  Patient is not able to tolerate Jardiance or Metformin.  Will resend [facility] and see if insurance will approve due to not being able to tolerate other diabetic medications. Labs ordered today.  Return to clinic in 3 months for reevaluation.  Call sooner if concerns arise.

## 2022-07-15 NOTE — Assessment & Plan Note (Signed)
Labs ordered at visit today.  Will make recommendations based on lab results.   

## 2022-07-15 NOTE — Progress Notes (Signed)
BP 135/88   Pulse 84   Temp 98.7 F (37.1 C) (Oral)   Wt (!) 364 lb 12.8 oz (165.5 kg)   SpO2 94%   BMI 57.14 kg/m    Subjective:    Patient ID: Ryan [name], male    DOB: September 17, 1970, 51 y.o.   MRN: [id number]  HPI: Ryan [name] is a 51 y.o. male  Chief Complaint  Patient presents with   Hypertension   Hyperlipidemia   Diabetes    3 month follow up. Pt reports rx DX with Pulmonary HTN...states he has some questions regarding diagnosis.    HYPERTENSION / HYPERLIPIDEMIA Satisfied with current treatment? no Duration of hypertension: years BP monitoring frequency: rarely BP range: 119/70-80 BP medication side effects: no Past BP meds:  lasix and metoprolol and losartan (cozaar) Duration of hyperlipidemia: years Cholesterol medication side effects: no Cholesterol supplements: none Past cholesterol medications: none Medication compliance: excellent compliance Aspirin: no Recent stressors: no Recurrent headaches: no Visual changes: no Palpitations: no Dyspnea: no Chest pain: no Lower extremity edema: no Dizzy/lightheaded: no  DIABETES Hypoglycemic episodes:no Polydipsia/polyuria: no Visual disturbance: no Chest pain: no Paresthesias: no Glucose Monitoring: no  Accucheck frequency: Not Checking  Fasting glucose:  Post prandial:  Evening:  Before meals: Taking Insulin?: no  Long acting insulin:  Short acting insulin: Blood Pressure Monitoring: rarely Retinal Examination: Up to Date Foot Exam: Up to Date Diabetic Education: Not Completed Pneumovax: Up to Date Influenza: Not up to Date Aspirin: no  CHRONIC KIDNEY DISEASE CKD status: controlled Medications renally dose: yes Previous renal evaluation: yes Pneumovax:  Up to Date Influenza Vaccine:  Not up to Date  Relevant past medical, surgical, family and social history reviewed and updated as indicated. Interim medical history since our last visit reviewed. Allergies and medications reviewed and  updated.  Review of Systems  Eyes:  Negative for visual disturbance.  Respiratory:  Negative for chest tightness and shortness of breath.   Cardiovascular:  Negative for chest pain, palpitations and leg swelling.  Endocrine: Negative for polydipsia and polyuria.  Neurological:  Negative for dizziness, light-headedness, numbness and headaches.    Per HPI unless specifically indicated above     Objective:    BP 135/88   Pulse 84   Temp 98.7 F (37.1 C) (Oral)   Wt (!) 364 lb 12.8 oz (165.5 kg)   SpO2 94%   BMI 57.14 kg/m   Wt Readings from Last 3 Encounters:  07/15/22 (!) 364 lb 12.8 oz (165.5 kg)  07/04/22 (!) 360 lb (163.3 kg)  04/12/22 (!) 351 lb 6.4 oz (159.4 kg)    Physical Exam Vitals and nursing note reviewed.  Constitutional:      General: He is not in acute distress.    Appearance: Normal appearance. He is obese. He is not ill-appearing, toxic-appearing or diaphoretic.  HENT:     Head: Normocephalic.     Right Ear: External ear normal.     Left Ear: External ear normal.     Nose: Nose normal. No congestion or rhinorrhea.     Mouth/Throat:     Mouth: Mucous membranes are moist.  Eyes:     General:        Right eye: No discharge.        Left eye: No discharge.     Extraocular Movements: Extraocular movements intact.     Conjunctiva/sclera: Conjunctivae normal.     Pupils: Pupils are equal, round, and reactive to light.  Cardiovascular:  Rate and Rhythm: Normal rate and regular rhythm.     Heart sounds: No murmur heard. Pulmonary:     Effort: Pulmonary effort is normal. No respiratory distress.     Breath sounds: Normal breath sounds. No wheezing, rhonchi or rales.  Abdominal:     General: Abdomen is flat. Bowel sounds are normal.  Musculoskeletal:     Cervical back: Normal range of motion and neck supple.  Skin:    General: Skin is warm and dry.     Capillary Refill: Capillary refill takes less than 2 seconds.  Neurological:     General: No focal  deficit present.     Mental Status: He is alert and oriented to person, place, and time.  Psychiatric:        Mood and Affect: Mood normal.        Behavior: Behavior normal.        Thought Content: Thought content normal.        Judgment: Judgment normal.     Results for orders placed or performed during the hospital encounter of 07/04/22  Resp Panel by RT-PCR (Flu A&B, Covid) Anterior Nasal Swab   Specimen: Anterior Nasal Swab  Result Value Ref Range   SARS Coronavirus 2 by RT PCR NEGATIVE NEGATIVE   Influenza A by PCR NEGATIVE NEGATIVE   Influenza B by PCR NEGATIVE NEGATIVE  CBC with Differential  Result Value Ref Range   WBC 4.2 4.0 - 10.5 K/uL   RBC 4.83 4.22 - 5.81 MIL/uL   Hemoglobin 14.2 13.0 - 17.0 g/dL   HCT 44.4 39.0 - 52.0 %   MCV 91.9 80.0 - 100.0 fL   MCH 29.4 26.0 - 34.0 pg   MCHC 32.0 30.0 - 36.0 g/dL   RDW 15.3 11.5 - 15.5 %   Platelets 242 150 - 400 K/uL   nRBC 0.0 0.0 - 0.2 %   Neutrophils Relative % 41 %   Neutro Abs 1.8 1.7 - 7.7 K/uL   Lymphocytes Relative 38 %   Lymphs Abs 1.6 0.7 - 4.0 K/uL   Monocytes Relative 14 %   Monocytes Absolute 0.6 0.1 - 1.0 K/uL   Eosinophils Relative 6 %   Eosinophils Absolute 0.2 0.0 - 0.5 K/uL   Basophils Relative 1 %   Basophils Absolute 0.0 0.0 - 0.1 K/uL   Immature Granulocytes 0 %   Abs Immature Granulocytes 0.01 0.00 - 0.07 K/uL  Comprehensive metabolic panel  Result Value Ref Range   Sodium 141 135 - 145 mmol/L   Potassium 4.0 3.5 - 5.1 mmol/L   Chloride 112 (H) 98 - 111 mmol/L   CO2 24 22 - 32 mmol/L   Glucose, Bld 113 (H) 70 - 99 mg/dL   BUN 17 6 - 20 mg/dL   Creatinine, Ser 1.49 (H) 0.61 - 1.24 mg/dL   Calcium 8.5 (L) 8.9 - 10.3 mg/dL   Total Protein 7.7 6.5 - 8.1 g/dL   Albumin 3.8 3.5 - 5.0 g/dL   AST 18 15 - 41 U/L   ALT 19 0 - 44 U/L   Alkaline Phosphatase 54 38 - 126 U/L   Total Bilirubin 0.6 0.3 - 1.2 mg/dL   GFR, Estimated 56 (L) >60 mL/min   Anion gap 5 5 - 15  Brain natriuretic peptide   Result Value Ref Range   B Natriuretic Peptide 74.8 0.0 - 100.0 pg/mL  Troponin I (High Sensitivity)  Result Value Ref Range   Troponin I (High Sensitivity) 20 (H) <  18 ng/L  Troponin I (High Sensitivity)  Result Value Ref Range   Troponin I (High Sensitivity) 18 (H) <18 ng/L      Assessment & Plan:   Problem List Items Addressed This Visit       Cardiovascular and Mediastinum   Hypertension    Chronic.  Controlled.  Continue with current medication regimen of metoprolol, Lasix and Losartan.  Labs ordered today.  Return to clinic in 6 months for reevaluation.  Call sooner if concerns arise.        Relevant Medications   furosemide (LASIX) 40 MG tablet   Other Relevant Orders   Comp Met (CMET)   Chronic diastolic CHF (congestive heart failure) (HCC)    Chronic. EF 60-65%. Continue Losartan and Lasix.  Not able to take Jardiance due to it possibly causing Recurrent Sepsis.  Follow up in 3 months. Keep follow up with Cardiology.  Call sooner if concerns arise.       Relevant Medications   furosemide (LASIX) 40 MG tablet   Atrial fibrillation with rapid ventricular response (HCC) - Primary    Chronic.  Controlled.  Continue with current medication regimen of metoprolol and Xerelto.  Labs ordered today.  Return to clinic in 3 months for reevaluation.  Call sooner if concerns arise.        Relevant Medications   furosemide (LASIX) 40 MG tablet   Other Relevant Orders   Comp Met (CMET)     Endocrine   Type 2 diabetes mellitus with diabetic chronic kidney disease (HCC)    Chronic.  Controlled.  Not currently on any medication due to Rheumatology recommending stopping Jardiance.  Patient is not able to tolerate Jardiance or Metformin.  Will resend [facility] and see if insurance will approve due to not being able to tolerate other diabetic medications. Labs ordered today.  Return to clinic in 3 months for reevaluation.  Call sooner if concerns arise.        Relevant Medications    tirzepatide (MOUNJARO) 2.5 MG/0.5ML Pen   Other Relevant Orders   Microalbumin, Urine Waived   HgB A1c   Hyperlipidemia associated with type 2 diabetes mellitus (HCC)    Chronic.  Controlled.  Continue with current medication regimen.  Labs ordered today.  Return to clinic in 3 months for reevaluation.  Call sooner if concerns arise.         Relevant Medications   furosemide (LASIX) 40 MG tablet   tirzepatide (MOUNJARO) 2.5 MG/0.5ML Pen   Other Relevant Orders   Lipid Profile     Genitourinary   CKD (chronic kidney disease), stage IIIa    Labs ordered at visit today.  Will make recommendations based on lab results.         Relevant Orders   CBC w/Diff     Hematopoietic and Hemostatic   Clotting disorder (HCC)    Chronic. Continue to follow up with Hematology. Also recommend following up with Pulmonology due to PE found on CT.      Relevant Orders   CBC w/Diff     Other   Morbid obesity with BMI of 50.0-59.9, adult (HCC)    BMI of 57. Recommended eating smaller high protein, low fat meals more frequently and exercising 30 mins a day 5 times a week with a goal of 10-15lb weight loss in the next 3 months. Patient voiced their understanding and motivation to adhere to these recommendations.       Relevant Medications   tirzepatide [facility])  2.5 MG/0.5ML Pen     Follow up plan: Return in about 3 months (around 10/15/2022) for HTN, HLD, DM2 FU.

## 2022-07-15 NOTE — Assessment & Plan Note (Signed)
BMI of 57. Recommended eating smaller high protein, low fat meals more frequently and exercising 30 mins a day 5 times a week with a goal of 10-15lb weight loss in the next 3 months. Patient voiced their understanding and motivation to adhere to these recommendations.

## 2022-07-15 NOTE — Assessment & Plan Note (Signed)
Chronic. EF 60-65%. Continue Losartan and Lasix.  Not able to take Jardiance due to it possibly causing Recurrent Sepsis.  Follow up in 3 months. Keep follow up with Cardiology.  Call sooner if concerns arise.

## 2022-07-15 NOTE — Assessment & Plan Note (Signed)
Chronic. Continue to follow up with Hematology. Also recommend following up with Pulmonology due to PE found on CT.

## 2022-07-15 NOTE — Assessment & Plan Note (Signed)
Chronic.  Controlled.  Continue with current medication regimen of metoprolol and Xerelto.  Labs ordered today.  Return to clinic in 3 months for reevaluation.  Call sooner if concerns arise.

## 2022-07-16 LAB — COMPREHENSIVE METABOLIC PANEL
ALT: 28 IU/L (ref 0–44)
AST: 22 IU/L (ref 0–40)
Albumin/Globulin Ratio: 1.3 (ref 1.2–2.2)
Albumin: 4.7 g/dL (ref 3.8–4.9)
Alkaline Phosphatase: 75 IU/L (ref 44–121)
BUN/Creatinine Ratio: 10 (ref 9–20)
BUN: 14 mg/dL (ref 6–24)
Bilirubin Total: 0.7 mg/dL (ref 0.0–1.2)
CO2: 21 mmol/L (ref 20–29)
Calcium: 9.3 mg/dL (ref 8.7–10.2)
Chloride: 103 mmol/L (ref 96–106)
Creatinine, Ser: 1.46 mg/dL — ABNORMAL HIGH (ref 0.76–1.27)
Globulin, Total: 3.6 g/dL (ref 1.5–4.5)
Glucose: 98 mg/dL (ref 70–99)
Potassium: 4.7 mmol/L (ref 3.5–5.2)
Sodium: 140 mmol/L (ref 134–144)
Total Protein: 8.3 g/dL (ref 6.0–8.5)
eGFR: 58 mL/min/{1.73_m2} — ABNORMAL LOW (ref >59)

## 2022-07-16 LAB — LIPID PANEL
Chol/HDL Ratio: 4 ratio (ref 0.0–5.0)
Cholesterol, Total: 180 mg/dL (ref 100–199)
HDL: 45 mg/dL (ref >39)
LDL Chol Calc (NIH): 116 mg/dL — ABNORMAL HIGH (ref 0–99)
Triglycerides: 102 mg/dL (ref 0–149)
VLDL Cholesterol Cal: 19 mg/dL (ref 5–40)

## 2022-07-16 LAB — CBC WITH DIFFERENTIAL/PLATELET
Basophils Absolute: 0 10*3/uL (ref 0.0–0.2)
Basos: 1 %
EOS (ABSOLUTE): 0.1 10*3/uL (ref 0.0–0.4)
Eos: 2 %
Hematocrit: 50.4 % (ref 37.5–51.0)
Hemoglobin: 16.6 g/dL (ref 13.0–17.7)
Immature Grans (Abs): 0 10*3/uL (ref 0.0–0.1)
Immature Granulocytes: 0 %
Lymphocytes Absolute: 2.2 10*3/uL (ref 0.7–3.1)
Lymphs: 53 %
MCH: 29.7 pg (ref 26.6–33.0)
MCHC: 32.9 g/dL (ref 31.5–35.7)
MCV: 90 fL (ref 79–97)
Monocytes Absolute: 0.5 10*3/uL (ref 0.1–0.9)
Monocytes: 12 %
Neutrophils Absolute: 1.3 10*3/uL — ABNORMAL LOW (ref 1.4–7.0)
Neutrophils: 32 %
Platelets: 283 10*3/uL (ref 150–450)
RBC: 5.58 x10E6/uL (ref 4.14–5.80)
RDW: 13.9 % (ref 11.6–15.4)
WBC: 4.1 10*3/uL (ref 3.4–10.8)

## 2022-07-16 LAB — HEMOGLOBIN A1C
Est. average glucose Bld gHb Est-mCnc: 137 mg/dL
Hgb A1c MFr Bld: 6.4 % — ABNORMAL HIGH (ref 4.8–5.6)

## 2022-07-16 NOTE — Progress Notes (Signed)
Hi Ryan [name]. It was good to see you yesterday.  Your lab work looks good.  Your cholesterol is improved from prior.  Your A1c is well controlled at 6.4.  Your kidney function remains stable.  No concerns at this time.  Continue with current medication regimen.  Follow up as discussed.

## 2022-07-27 ENCOUNTER — Telehealth: Payer: Self-pay

## 2022-07-27 NOTE — Telephone Encounter (Signed)
PA for [facility] initiated and submitted via Cover My Meds. Key: [id number] [id number]

## 2022-07-30 ENCOUNTER — Other Ambulatory Visit: Payer: Self-pay

## 2022-07-30 ENCOUNTER — Emergency Department: Payer: No Typology Code available for payment source

## 2022-07-30 ENCOUNTER — Emergency Department
Admission: EM | Admit: 2022-07-30 | Discharge: 2022-07-30 | Disposition: A | Discharge status: Home / Self Care | Payer: No Typology Code available for payment source | Attending: Emergency Medicine | Admitting: Emergency Medicine

## 2022-07-30 DIAGNOSIS — Z1152 Encounter for screening for COVID-19: Secondary | ICD-10-CM | POA: Insufficient documentation

## 2022-07-30 DIAGNOSIS — R079 Chest pain, unspecified: Secondary | ICD-10-CM

## 2022-07-30 DIAGNOSIS — R0789 Other chest pain: Secondary | ICD-10-CM | POA: Insufficient documentation

## 2022-07-30 DIAGNOSIS — I130 Hypertensive heart and chronic kidney disease with heart failure and stage 1 through stage 4 chronic kidney disease, or unspecified chronic kidney disease: Secondary | ICD-10-CM | POA: Insufficient documentation

## 2022-07-30 DIAGNOSIS — E1122 Type 2 diabetes mellitus with diabetic chronic kidney disease: Secondary | ICD-10-CM | POA: Insufficient documentation

## 2022-07-30 DIAGNOSIS — R0602 Shortness of breath: Secondary | ICD-10-CM | POA: Insufficient documentation

## 2022-07-30 DIAGNOSIS — Z7901 Long term (current) use of anticoagulants: Secondary | ICD-10-CM | POA: Diagnosis not present

## 2022-07-30 DIAGNOSIS — I5030 Unspecified diastolic (congestive) heart failure: Secondary | ICD-10-CM | POA: Diagnosis not present

## 2022-07-30 DIAGNOSIS — N1831 Chronic kidney disease, stage 3a: Secondary | ICD-10-CM | POA: Insufficient documentation

## 2022-07-30 LAB — CBC
HCT: 47.1 % (ref 39.0–52.0)
Hemoglobin: 15.6 g/dL (ref 13.0–17.0)
MCH: 30.3 pg (ref 26.0–34.0)
MCHC: 33.1 g/dL (ref 30.0–36.0)
MCV: 91.5 fL (ref 80.0–100.0)
Platelets: 223 10*3/uL (ref 150–400)
RBC: 5.15 MIL/uL (ref 4.22–5.81)
RDW: 15 % (ref 11.5–15.5)
WBC: 5.2 10*3/uL (ref 4.0–10.5)
nRBC: 0 % (ref 0.0–0.2)

## 2022-07-30 LAB — BASIC METABOLIC PANEL
Anion gap: 9 (ref 5–15)
BUN: 16 mg/dL (ref 6–20)
CO2: 25 mmol/L (ref 22–32)
Calcium: 9 mg/dL (ref 8.9–10.3)
Chloride: 105 mmol/L (ref 98–111)
Creatinine, Ser: 1.43 mg/dL — ABNORMAL HIGH (ref 0.61–1.24)
GFR, Estimated: 59 mL/min — ABNORMAL LOW (ref >60)
Glucose, Bld: 109 mg/dL — ABNORMAL HIGH (ref 70–99)
Potassium: 3.8 mmol/L (ref 3.5–5.1)
Sodium: 139 mmol/L (ref 135–145)

## 2022-07-30 LAB — RESP PANEL BY RT-PCR (FLU A&B, COVID) ARPGX2
Influenza A by PCR: NEGATIVE
Influenza B by PCR: NEGATIVE
SARS Coronavirus 2 by RT PCR: NEGATIVE

## 2022-07-30 LAB — HEPATIC FUNCTION PANEL
ALT: 28 U/L (ref 0–44)
AST: 24 U/L (ref 15–41)
Albumin: 3.7 g/dL (ref 3.5–5.0)
Alkaline Phosphatase: 60 U/L (ref 38–126)
Bilirubin, Direct: 0.1 mg/dL (ref 0.0–0.2)
Indirect Bilirubin: 0.8 mg/dL (ref 0.3–0.9)
Total Bilirubin: 0.9 mg/dL (ref 0.3–1.2)
Total Protein: 7.8 g/dL (ref 6.5–8.1)

## 2022-07-30 LAB — TROPONIN I (HIGH SENSITIVITY)
Troponin I (High Sensitivity): 14 ng/L (ref <18)
Troponin I (High Sensitivity): 14 ng/L (ref <18)

## 2022-07-30 LAB — LIPASE, BLOOD: Lipase: 35 U/L (ref 11–51)

## 2022-07-30 MED ORDER — SODIUM CHLORIDE 0.9 % IV BOLUS: 500.0000 mL | Freq: Once | INTRAVENOUS | Status: DC

## 2022-07-30 MED ORDER — ACETAMINOPHEN 500 MG PO TABS
1000.0000 mg | ORAL_TABLET | Freq: Once | ORAL | Status: AC
  Administered 2022-07-30: 1000 mg via ORAL
  Filled 2022-07-30: qty 2

## 2022-07-30 MED ORDER — IOHEXOL 350 MG/ML SOLN
100.0000 mL | Freq: Once | INTRAVENOUS | Status: AC | PRN
  Administered 2022-07-30: 100 mL via INTRAVENOUS

## 2022-07-30 MED ORDER — LIDOCAINE 5 % EX PTCH
1.0000 | MEDICATED_PATCH | Freq: Once | CUTANEOUS | Status: DC
  Administered 2022-07-30: 1 via TRANSDERMAL
  Filled 2022-07-30: qty 1

## 2022-07-30 NOTE — Discharge Instructions (Addendum)
-  We are unsure of the exact etiology of your chest pain at this time, however does not appear to be from a pulmonary embolism or any serious or life-threatening pathology at this time.  Please continue taking your Xarelto as prescribed.  Monitor your symptoms closely and return to the emergency department anytime if you begin to experience any new or worsening symptoms.  Please follow-up with your primary care provider and/or cardiologist as needed.

## 2022-07-30 NOTE — ED Provider Notes (Signed)
  Physical Exam  BP 121/71 (BP Location: Right Arm)   Pulse 83   Temp 98.6 F (37 C)   Resp (!) 22   SpO2 98%   Physical Exam  General:          Awake, no distress.  CV:                  Good peripheral perfusion.  Resp:               Normal effort.  Abd:                 No distention.  Soft and nontender Other:              Patient has chronic leg swelling which she reports is at baseline.  No new calf tenderness or pain.  Procedures  Procedures  ED Course / MDM    Medical Decision Making Amount and/or Complexity of Data Reviewed Labs: ordered. Radiology: ordered.  Risk OTC drugs. Prescription drug management.   This patient's care was transferred over to me from Dr. Fuller Plan at approximately 1930.  Plan is to await the results of the CT angiogram and reevaluate.  CT angiogram does not show any acute intrathoracic pathology, particularly no evidence of acute pulmonary artery embolus.  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  He states that he has recently reinitiated his Xarelto again.  Encouraged him to continue doing so.  He appears clinically stable at this time.  98% on room air.  His lab work-up is reassuring.  EKG does not show any acute findings.  Respiratory panel is negative for COVID-19 or influenza.  Very low suspicion for any serious life-threatening pathology at this time.  He does have some cough and congestion.  Possible benign viral respiratory infection.  Encouraged him to continue taking his Xarelto and follow-up with his primary care provider as needed.  He was amenable to this plan.  Will discharge.         Varney Daily, [place] 07/30/22 2121    Concha Se, MD 07/31/22 [phone]

## 2022-07-30 NOTE — ED Provider Notes (Signed)
[facility] Provider Note    Event Date/Time   First MD Initiated Contact with Patient 07/30/22 1716     (approximate)   History   Chest Pain   HPI  Ryan [name] is a 51 y.o. male  HTN, pre-DM, with recurrent DVT/PE s/p IVC filter x2 (2005, 2012 on Xarelto, dCHF,, PVD, CKD stage IIIa, atrial fibrillation (s/p of ablation) who p/w chest pain.  Patient reports right-sided chest pain with a history of PE.  He has been out of town so not taking his Xarelto and taking aspirin only.  On review of records patient was seen in July 04, 2022 for shortness of breath he is supposed to be on Xarelto.  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  This was discussed with Dr. Belia Heman and given he was not hypoxic they recommend discharge home.  He was given some Lasix and was recommended to continue his Lasix at home he follows with a pulmonologist at [facility].  Patient reports he is compliant with his Lasix.  He denies any worsening shortness of breath or leg swelling.  He states that he has noticed some pain on the left side of his chest over the past few days.  He reports the pain is worse with coughing.  He denies any abdominal pain.  He reports that he has missed about 5 days of his Xarelto secondary to being on vacation but he has been taking it the last few days but he still continued to have the pain which is why he came in he wanted to make sure that there was not a blood clot.      Physical Exam   Triage Vital Signs: ED Triage Vitals  Enc Vitals Group     BP 07/30/22 1534 127/88     Pulse Rate 07/30/22 1534 98     Resp 07/30/22 1534 (!) 22     Temp 07/30/22 1534 98.6 F (37 C)     Temp src --      SpO2 07/30/22 1534 91 %     Weight --      Height --      Head Circumference --      Peak Flow --      Pain Score 07/30/22  1526 7     Pain Loc --      Pain Edu? --      Excl. in GC? --     Most recent vital signs: Vitals:   07/30/22 1534  BP: 127/88  Pulse: 98  Resp: (!) 22  Temp: 98.6 F (37 C)  SpO2: 91%     General: Awake, no distress.  CV:  Good peripheral perfusion.  Resp:  Normal effort.  Abd:  No distention.  Soft and nontender Other:  Patient has chronic leg swelling which she reports is at baseline.  No new calf tenderness or pain.   ED Results / Procedures / Treatments   Labs (all labs ordered are listed, but only abnormal results are displayed) Labs Reviewed  BASIC METABOLIC PANEL - Abnormal; Notable for the following components:      Result Value   Glucose, Bld 109 (*)    Creatinine, Ser 1.43 (*)    GFR, Estimated 59 (*)    All other components within normal limits  CBC  TROPONIN I (HIGH SENSITIVITY)  TROPONIN I (HIGH SENSITIVITY)     EKG  My interpretation of EKG:  Pending   RADIOLOGY I have reviewed the xray personally and interpreted no evidence of pulmonary edema    IMPRESSION: 1. Lungs are clear. 2. Enlarged pulmonary arteries, suggesting pulmonary hypertension.   PROCEDURES:  Critical Care performed: No  Procedures   MEDICATIONS ORDERED IN ED: Medications  acetaminophen (TYLENOL) tablet 1,000 mg (has no administration in time range)  lidocaine (LIDODERM) 5 % 1 patch (has no administration in time range)     IMPRESSION / MDM / ASSESSMENT AND PLAN / ED COURSE  I reviewed the triage vital signs and the nursing notes.   Patient's presentation is most consistent with acute presentation with potential threat to life or bodily function.   Patient comes in with noncompliance with medications with concern for left-sided chest pain.  Could be musculoskeletal in nature but given history we discussed CT imaging to rule out PE, ACS, chest x-ray to evaluate for pneumonia.  Denies any worsening leg swelling or shortness of breath to suggest worsening  pulmonary edema.  His oxygen levels are between 91 to 93% we will add on COVID, flu testing.  I discussed with patient he is got no swelling in his legs or pain in his legs to suggest DVT and declines ultrasounds.  He also does have IVC filter is in place.   Troponin negative.  BMP shows stable creatinine.  CBC stable  Patient had allergy listed of contrast but I discussed with patient that it was just because of his kidney function he denies any actual allergy has had contrast multiple times including just a month ago without any issues therefore he is willing to have this taken out of his allergy list.  Patient be handed off to oncoming team pending CT scan, repeat troponin, ambulation trial.      FINAL CLINICAL IMPRESSION(S) / ED DIAGNOSES   Final diagnoses:  Nonspecific chest pain     Rx / DC Orders   ED Discharge Orders     None        Note:  This document was prepared using Dragon voice recognition software and may include unintentional dictation errors.   Concha Se, MD 07/30/22 [phone]

## 2022-07-30 NOTE — ED Triage Notes (Addendum)
Pt comes with c/o right sided CP. Pt states hx of PE and is only on aspirin.  Pt is normally on xarelto but was out of town and has not taken it.

## 2022-08-16 ENCOUNTER — Telehealth: Payer: Self-pay | Admitting: Nurse Practitioner

## 2022-08-16 NOTE — Telephone Encounter (Signed)
Copied from CRM [id number]. Topic: General - Other >> Aug 13, 2022  4:34 PM Everette C wrote: Reason for CRM: The patient has made contact regarding their FMLA paperwork   The patient has been instructed to notify their provider that clarity is needed for some of the questions   #7 and #8 on the previously completed paperwork need additional information   The patient would like to be contacted by a member of clinical staff regarding their paperwork when possible  Please contact further when available

## 2022-08-19 NOTE — Telephone Encounter (Signed)
Called and discussed paperwork with the patient. Addressed concerns, paperwork updated with verbal approval from provider and re-faxed in for the patient.

## 2022-09-06 NOTE — Telephone Encounter (Signed)
Patient called back and requested that FMLA paperwork be updated again to allow him 3 days per week instead of 3 episodes(up to 3 consecutive days) per month. He was denied FMLA coverage for some days because patient was not aware that days had to be consecutive.

## 2022-09-08 NOTE — Telephone Encounter (Signed)
Okay to update?

## 2022-09-08 NOTE — Telephone Encounter (Signed)
Called and spoke with patient. He states that he was not covered for several days at the end of 2023 because of how the paperwork was filled out. Patient states that he never knows when he is going to have an episode with his breathing. States that the person he is working with for his FMLA stated that we need to fill out the paper work for 3 days off per week to ensure he is covered.   Routing to provider to ok this update.

## 2022-09-08 NOTE — Telephone Encounter (Signed)
Form updated and re-faxed for the patient.

## 2022-10-15 ENCOUNTER — Ambulatory Visit (INDEPENDENT_AMBULATORY_CARE_PROVIDER_SITE_OTHER): Payer: No Typology Code available for payment source | Admitting: Nurse Practitioner

## 2022-10-15 ENCOUNTER — Encounter: Payer: Self-pay | Admitting: Nurse Practitioner

## 2022-10-15 ENCOUNTER — Telehealth: Payer: Self-pay | Admitting: Nurse Practitioner

## 2022-10-15 VITALS — BP 130/80 | HR 80 | Temp 97.5°F | Wt 371.2 lb

## 2022-10-15 DIAGNOSIS — R0602 Shortness of breath: Secondary | ICD-10-CM

## 2022-10-15 DIAGNOSIS — N1831 Chronic kidney disease, stage 3a: Secondary | ICD-10-CM

## 2022-10-15 DIAGNOSIS — E1169 Type 2 diabetes mellitus with other specified complication: Secondary | ICD-10-CM

## 2022-10-15 DIAGNOSIS — E1122 Type 2 diabetes mellitus with diabetic chronic kidney disease: Secondary | ICD-10-CM | POA: Diagnosis not present

## 2022-10-15 DIAGNOSIS — I5032 Chronic diastolic (congestive) heart failure: Secondary | ICD-10-CM | POA: Diagnosis not present

## 2022-10-15 DIAGNOSIS — D689 Coagulation defect, unspecified: Secondary | ICD-10-CM

## 2022-10-15 DIAGNOSIS — Z6841 Body Mass Index (BMI) 40.0 and over, adult: Secondary | ICD-10-CM

## 2022-10-15 DIAGNOSIS — E785 Hyperlipidemia, unspecified: Secondary | ICD-10-CM

## 2022-10-15 DIAGNOSIS — I10 Essential (primary) hypertension: Secondary | ICD-10-CM

## 2022-10-15 MED ORDER — TIRZEPATIDE 5 MG/0.5ML ~~LOC~~ SOAJ: 5.0000 mg | SUBCUTANEOUS | 0 refills | Status: DC

## 2022-10-15 NOTE — Assessment & Plan Note (Signed)
Chronic.  Controlled.  Not currently on any medication due to Rheumatology recommending stopping Jardiance.  Patient is not able to tolerate Jardiance or Metformin.  Discussed benefits of Mounjaro for patient.  He agrees to restart the medication.  Will send in 34m dose of Mounjaro to continue with medication.   Labs ordered today.  Return to clinic in 3 months for reevaluation.  Call sooner if concerns arise.

## 2022-10-15 NOTE — Telephone Encounter (Signed)
Patient brought in [place] paper work to be completed by the provider. It has been placed in the providers folder.

## 2022-10-15 NOTE — Assessment & Plan Note (Signed)
Labs ordered at visit today.  Will make recommendations based on lab results.   Not able to tolerate SGLT2.  Continue with Mounjaro.  Labs ordered today.  Follow up in 3 months.  Call sooner if concerns arise.

## 2022-10-15 NOTE — Assessment & Plan Note (Signed)
Ongoing concern.  Has IVC filter placed.  Has not followed up with Hematology.  Needs new hematologist due to insurance change.  Referral placed today.

## 2022-10-15 NOTE — Progress Notes (Signed)
BP 130/80   Pulse 80   Temp (!) 97.5 F (36.4 C) (Oral)   Wt (!) 371 lb 3.2 oz (168.4 kg)   SpO2 92%   BMI 58.14 kg/m    Subjective:    Patient ID: Ryan [name], male    DOB: June 06, 1971, 52 y.o.   MRN: [id number]  HPI: Ryan [name] is a 52 y.o. male  Chief Complaint  Patient presents with   Diabetes   Hypertension   Atrial Fibrillation   HYPERTENSION / HYPERLIPIDEMIA Satisfied with current treatment? no Duration of hypertension: years BP monitoring frequency: not checking BP range:  BP medication side effects: no Past BP meds:  lasix and metoprolol and losartan (cozaar) Duration of hyperlipidemia: years Cholesterol medication side effects: no Cholesterol supplements: none Past cholesterol medications: none Medication compliance: excellent compliance Aspirin: no Recent stressors: no Recurrent headaches: no Visual changes: no Palpitations: no Dyspnea: no Chest pain: no Lower extremity edema: yes Dizzy/lightheaded: no  DIABETES Only took mounjaro one time. Hypoglycemic episodes:no Polydipsia/polyuria: no Visual disturbance: no Chest pain: no Paresthesias: no Glucose Monitoring: no  Accucheck frequency: Not Checking  Fasting glucose:  Post prandial:  Evening:  Before meals: Taking Insulin?: no  Long acting insulin:  Short acting insulin: Blood Pressure Monitoring: rarely Retinal Examination: Up to Date Foot Exam: Up to Date Diabetic Education: Not Completed Pneumovax: Up to Date Influenza: Not up to Date Aspirin: no  CHRONIC KIDNEY DISEASE CKD status: controlled Medications renally dose: yes Previous renal evaluation: yes Pneumovax:  Up to Date Influenza Vaccine:  Not up to Date  Patient states he is having shortness of breath at night.    Relevant past medical, surgical, family and social history reviewed and updated as indicated. Interim medical history since our last visit reviewed. Allergies and medications reviewed and  updated.  Review of Systems  Eyes:  Negative for visual disturbance.  Respiratory:  Positive for shortness of breath. Negative for chest tightness.   Cardiovascular:  Positive for leg swelling. Negative for chest pain and palpitations.  Endocrine: Negative for polydipsia and polyuria.  Neurological:  Negative for dizziness, light-headedness, numbness and headaches.    Per HPI unless specifically indicated above     Objective:    BP 130/80   Pulse 80   Temp (!) 97.5 F (36.4 C) (Oral)   Wt (!) 371 lb 3.2 oz (168.4 kg)   SpO2 92%   BMI 58.14 kg/m   Wt Readings from Last 3 Encounters:  10/15/22 (!) 371 lb 3.2 oz (168.4 kg)  07/15/22 (!) 364 lb 12.8 oz (165.5 kg)  07/04/22 (!) 360 lb (163.3 kg)    Physical Exam Vitals and nursing note reviewed.  Constitutional:      General: He is not in acute distress.    Appearance: Normal appearance. He is obese. He is not ill-appearing, toxic-appearing or diaphoretic.  HENT:     Head: Normocephalic.     Right Ear: External ear normal.     Left Ear: External ear normal.     Nose: Nose normal. No congestion or rhinorrhea.     Mouth/Throat:     Mouth: Mucous membranes are moist.  Eyes:     General:        Right eye: No discharge.        Left eye: No discharge.     Extraocular Movements: Extraocular movements intact.     Conjunctiva/sclera: Conjunctivae normal.     Pupils: Pupils are equal, round, and reactive to  light.  Cardiovascular:     Rate and Rhythm: Normal rate and regular rhythm.     Heart sounds: No murmur heard. Pulmonary:     Effort: Pulmonary effort is normal. No respiratory distress.     Breath sounds: Normal breath sounds. No wheezing, rhonchi or rales.  Abdominal:     General: Abdomen is flat. Bowel sounds are normal.  Musculoskeletal:     Cervical back: Normal range of motion and neck supple.     Right lower leg: 1+ Edema present.     Left lower leg: 1+ Edema present.  Skin:    General: Skin is warm and dry.      Capillary Refill: Capillary refill takes less than 2 seconds.  Neurological:     General: No focal deficit present.     Mental Status: He is alert and oriented to person, place, and time.  Psychiatric:        Mood and Affect: Mood normal.        Behavior: Behavior normal.        Thought Content: Thought content normal.        Judgment: Judgment normal.     Results for orders placed or performed during the hospital encounter of 07/30/22  Resp Panel by RT-PCR (Flu A&B, Covid) Anterior Nasal Swab   Specimen: Anterior Nasal Swab  Result Value Ref Range   SARS Coronavirus 2 by RT PCR NEGATIVE NEGATIVE   Influenza A by PCR NEGATIVE NEGATIVE   Influenza B by PCR NEGATIVE NEGATIVE  Basic metabolic panel  Result Value Ref Range   Sodium 139 135 - 145 mmol/L   Potassium 3.8 3.5 - 5.1 mmol/L   Chloride 105 98 - 111 mmol/L   CO2 25 22 - 32 mmol/L   Glucose, Bld 109 (H) 70 - 99 mg/dL   BUN 16 6 - 20 mg/dL   Creatinine, Ser 1.43 (H) 0.61 - 1.24 mg/dL   Calcium 9.0 8.9 - 10.3 mg/dL   GFR, Estimated 59 (L) >60 mL/min   Anion gap 9 5 - 15  CBC  Result Value Ref Range   WBC 5.2 4.0 - 10.5 K/uL   RBC 5.15 4.22 - 5.81 MIL/uL   Hemoglobin 15.6 13.0 - 17.0 g/dL   HCT 47.1 39.0 - 52.0 %   MCV 91.5 80.0 - 100.0 fL   MCH 30.3 26.0 - 34.0 pg   MCHC 33.1 30.0 - 36.0 g/dL   RDW 15.0 11.5 - 15.5 %   Platelets 223 150 - 400 K/uL   nRBC 0.0 0.0 - 0.2 %  Hepatic function panel  Result Value Ref Range   Total Protein 7.8 6.5 - 8.1 g/dL   Albumin 3.7 3.5 - 5.0 g/dL   AST 24 15 - 41 U/L   ALT 28 0 - 44 U/L   Alkaline Phosphatase 60 38 - 126 U/L   Total Bilirubin 0.9 0.3 - 1.2 mg/dL   Bilirubin, Direct 0.1 0.0 - 0.2 mg/dL   Indirect Bilirubin 0.8 0.3 - 0.9 mg/dL  Lipase, blood  Result Value Ref Range   Lipase 35 11 - 51 U/L  Troponin I (High Sensitivity)  Result Value Ref Range   Troponin I (High Sensitivity) 14 <18 ng/L  Troponin I (High Sensitivity)  Result Value Ref Range   Troponin I  (High Sensitivity) 14 <18 ng/L      Assessment & Plan:   Problem List Items Addressed This Visit       Cardiovascular and Mediastinum  Hypertension    Chronic.  Controlled.  Continue with current medication regimen of metoprolol, Lasix and Losartan.  Labs ordered today.  Return to clinic in 3 months for reevaluation.  Call sooner if concerns arise.        Relevant Medications   XARELTO 20 MG TABS tablet   Other Relevant Orders   Comp Met (CMET)   Chronic diastolic CHF (congestive heart failure) (HCC) - Primary    Chronic. EF 60-65%. Continue Losartan and Lasix.  Not able to take Jardiance due to it possibly causing Recurrent Sepsis.    Has not seen Cardiology recently.  Would like benefit from [place].  Will refer to HF clinic for evaluation and management.  Follow up in 3 months. Call sooner if concerns arise.       Relevant Medications   XARELTO 20 MG TABS tablet   Other Relevant Orders   AMB referral to CHF clinic     Endocrine   Type 2 diabetes mellitus with diabetic chronic kidney disease (HCC)    Chronic.  Controlled.  Not currently on any medication due to Rheumatology recommending stopping Jardiance.  Patient is not able to tolerate Jardiance or Metformin.  Discussed benefits of Mounjaro for patient.  He agrees to restart the medication.  Will send in 55m dose of Mounjaro to continue with medication.   Labs ordered today.  Return to clinic in 3 months for reevaluation.  Call sooner if concerns arise.        Relevant Medications   tirzepatide (MOUNJARO) 5 MG/0.5ML Pen   Other Relevant Orders   Urine Microalbumin w/creat. ratio   HgB A1c   Hyperlipidemia associated with type 2 diabetes mellitus (HCC)    Chronic.  Controlled.  Continue with current medication regimen.  Labs ordered today.  Return to clinic in 3 months for reevaluation.  Call sooner if concerns arise.         Relevant Medications   XARELTO 20 MG TABS tablet   tirzepatide (MOUNJARO) 5 MG/0.5ML Pen    Other Relevant Orders   Lipid Profile     Genitourinary   CKD (chronic kidney disease), stage IIIa    Labs ordered at visit today.  Will make recommendations based on lab results.   Not able to tolerate SGLT2.  Continue with Mounjaro.  Labs ordered today.  Follow up in 3 months.  Call sooner if concerns arise.         Relevant Orders   CBC w/Diff     Hematopoietic and Hemostatic   Clotting disorder ([place]    Ongoing concern.  Has IVC filter placed.  Has not followed up with Hematology.  Needs new hematologist due to insurance change.  Referral placed today.       Relevant Orders   CBC w/Diff   Ambulatory referral to Hematology / Oncology     Other   Morbid obesity with BMI of 50.0-59.9, adult (HCC)    Recommended eating smaller high protein, low fat meals more frequently and exercising 30 mins a day 5 times a week with a goal of 10-15lb weight loss in the next 3 months.       Relevant Medications   tirzepatide (MOUNJARO) 5 MG/0.5ML Pen   Other Visit Diagnoses     Shortness of breath       Referral to Pulmonology placed.  Needs a sleep study. Suspect sleep apnea. Also has history of multiple PE. Needs new pulmonologist due to insurance.   Relevant Orders  Ambulatory referral to Pulmonology        Follow up plan: Return in about 3 months (around 01/13/2023) for HTN, HLD, DM2 FU.

## 2022-10-15 NOTE — Assessment & Plan Note (Signed)
Chronic.  Controlled.  Continue with current medication regimen.  Labs ordered today.  Return to clinic in 3 months for reevaluation.  Call sooner if concerns arise.   

## 2022-10-15 NOTE — Assessment & Plan Note (Signed)
Chronic. EF 60-65%. Continue Losartan and Lasix.  Not able to take Jardiance due to it possibly causing Recurrent Sepsis.    Has not seen Cardiology recently.  Would like benefit from [place].  Will refer to HF clinic for evaluation and management.  Follow up in 3 months. Call sooner if concerns arise.

## 2022-10-15 NOTE — Assessment & Plan Note (Signed)
Chronic.  Controlled.  Continue with current medication regimen of metoprolol, Lasix and Losartan.  Labs ordered today.  Return to clinic in 3 months for reevaluation.  Call sooner if concerns arise.

## 2022-10-15 NOTE — Assessment & Plan Note (Signed)
Recommended eating smaller high protein, low fat meals more frequently and exercising 30 mins a day 5 times a week with a goal of 10-15lb weight loss in the next 3 months.  

## 2022-10-16 LAB — CBC WITH DIFFERENTIAL/PLATELET
Basophils Absolute: 0 10*3/uL (ref 0.0–0.2)
Basos: 1 %
EOS (ABSOLUTE): 0.2 10*3/uL (ref 0.0–0.4)
Eos: 5 %
Hematocrit: 48.4 % (ref 37.5–51.0)
Hemoglobin: 15.9 g/dL (ref 13.0–17.7)
Immature Grans (Abs): 0 10*3/uL (ref 0.0–0.1)
Immature Granulocytes: 0 %
Lymphocytes Absolute: 1.4 10*3/uL (ref 0.7–3.1)
Lymphs: 40 %
MCH: 30.7 pg (ref 26.6–33.0)
MCHC: 32.9 g/dL (ref 31.5–35.7)
MCV: 93 fL (ref 79–97)
Monocytes Absolute: 0.5 10*3/uL (ref 0.1–0.9)
Monocytes: 13 %
Neutrophils Absolute: 1.5 10*3/uL (ref 1.4–7.0)
Neutrophils: 41 %
Platelets: 280 10*3/uL (ref 150–450)
RBC: 5.18 x10E6/uL (ref 4.14–5.80)
RDW: 13.9 % (ref 11.6–15.4)
WBC: 3.5 10*3/uL (ref 3.4–10.8)

## 2022-10-16 LAB — COMPREHENSIVE METABOLIC PANEL
ALT: 19 IU/L (ref 0–44)
AST: 17 IU/L (ref 0–40)
Albumin/Globulin Ratio: 1.4 (ref 1.2–2.2)
Albumin: 4.4 g/dL (ref 3.8–4.9)
Alkaline Phosphatase: 67 IU/L (ref 44–121)
BUN/Creatinine Ratio: 9 (ref 9–20)
BUN: 12 mg/dL (ref 6–24)
Bilirubin Total: 0.5 mg/dL (ref 0.0–1.2)
CO2: 18 mmol/L — ABNORMAL LOW (ref 20–29)
Calcium: 9.2 mg/dL (ref 8.7–10.2)
Chloride: 105 mmol/L (ref 96–106)
Creatinine, Ser: 1.41 mg/dL — ABNORMAL HIGH (ref 0.76–1.27)
Globulin, Total: 3.1 g/dL (ref 1.5–4.5)
Glucose: 108 mg/dL — ABNORMAL HIGH (ref 70–99)
Potassium: 4.4 mmol/L (ref 3.5–5.2)
Sodium: 141 mmol/L (ref 134–144)
Total Protein: 7.5 g/dL (ref 6.0–8.5)
eGFR: 60 mL/min/{1.73_m2} (ref >59)

## 2022-10-16 LAB — LIPID PANEL
Chol/HDL Ratio: 4.1 ratio (ref 0.0–5.0)
Cholesterol, Total: 168 mg/dL (ref 100–199)
HDL: 41 mg/dL (ref >39)
LDL Chol Calc (NIH): 114 mg/dL — ABNORMAL HIGH (ref 0–99)
Triglycerides: 67 mg/dL (ref 0–149)
VLDL Cholesterol Cal: 13 mg/dL (ref 5–40)

## 2022-10-16 LAB — HEMOGLOBIN A1C
Est. average glucose Bld gHb Est-mCnc: 143 mg/dL
Hgb A1c MFr Bld: 6.6 % — ABNORMAL HIGH (ref 4.8–5.6)

## 2022-10-17 LAB — MICROALBUMIN / CREATININE URINE RATIO
Creatinine, Urine: 178.8 mg/dL
Microalb/Creat Ratio: 461 mg/g creat — ABNORMAL HIGH (ref 0–29)
Microalbumin, Urine: 824.2 ug/mL

## 2022-10-18 NOTE — Progress Notes (Signed)
Hi Ryan [name]. Your lab work shows that your kidney function remains stable.  Cholesterol and A1c are also well controlled.  Continue with current medication regimen.  Follow up as discussed.

## 2022-10-19 NOTE — Telephone Encounter (Signed)
Paperwork started and given to provider to complete.

## 2022-10-22 NOTE — Telephone Encounter (Signed)
Paperwork has been completed and faxed.   Called and notified patient of this.

## 2022-10-28 ENCOUNTER — Encounter: Payer: Self-pay | Admitting: Family

## 2022-10-28 ENCOUNTER — Ambulatory Visit: Payer: No Typology Code available for payment source | Attending: Family | Admitting: Family

## 2022-10-28 VITALS — BP 130/92 | HR 81 | Resp 16 | Ht 67.0 in | Wt 366.0 lb

## 2022-10-28 DIAGNOSIS — I4891 Unspecified atrial fibrillation: Secondary | ICD-10-CM | POA: Diagnosis not present

## 2022-10-28 DIAGNOSIS — I10 Essential (primary) hypertension: Secondary | ICD-10-CM | POA: Diagnosis not present

## 2022-10-28 DIAGNOSIS — Z8249 Family history of ischemic heart disease and other diseases of the circulatory system: Secondary | ICD-10-CM | POA: Insufficient documentation

## 2022-10-28 DIAGNOSIS — Z86718 Personal history of other venous thrombosis and embolism: Secondary | ICD-10-CM | POA: Diagnosis not present

## 2022-10-28 DIAGNOSIS — Z86711 Personal history of pulmonary embolism: Secondary | ICD-10-CM | POA: Insufficient documentation

## 2022-10-28 DIAGNOSIS — I825Z3 Chronic embolism and thrombosis of unspecified deep veins of distal lower extremity, bilateral: Secondary | ICD-10-CM | POA: Diagnosis not present

## 2022-10-28 DIAGNOSIS — G4733 Obstructive sleep apnea (adult) (pediatric): Secondary | ICD-10-CM | POA: Insufficient documentation

## 2022-10-28 DIAGNOSIS — Z7985 Long-term (current) use of injectable non-insulin antidiabetic drugs: Secondary | ICD-10-CM | POA: Diagnosis not present

## 2022-10-28 DIAGNOSIS — Z833 Family history of diabetes mellitus: Secondary | ICD-10-CM | POA: Insufficient documentation

## 2022-10-28 DIAGNOSIS — Z7901 Long term (current) use of anticoagulants: Secondary | ICD-10-CM | POA: Insufficient documentation

## 2022-10-28 DIAGNOSIS — E1151 Type 2 diabetes mellitus with diabetic peripheral angiopathy without gangrene: Secondary | ICD-10-CM | POA: Insufficient documentation

## 2022-10-28 DIAGNOSIS — I110 Hypertensive heart disease with heart failure: Secondary | ICD-10-CM | POA: Diagnosis not present

## 2022-10-28 DIAGNOSIS — I480 Paroxysmal atrial fibrillation: Secondary | ICD-10-CM

## 2022-10-28 DIAGNOSIS — Z79899 Other long term (current) drug therapy: Secondary | ICD-10-CM | POA: Diagnosis not present

## 2022-10-28 DIAGNOSIS — I5032 Chronic diastolic (congestive) heart failure: Secondary | ICD-10-CM | POA: Diagnosis present

## 2022-10-28 MED ORDER — SACUBITRIL-VALSARTAN 24-26 MG PO TABS: 1.0000 | ORAL_TABLET | Freq: Two times a day (BID) | ORAL | 3 refills | Status: DC

## 2022-10-28 NOTE — Patient Instructions (Addendum)
Begin weighing daily and call for an overnight weight gain of 3 pounds or more or a weekly weight gain of more than 5 pounds.   Stop taking losartan as you will be starting entresto as 1 tablet in the morning and 1 tablet in the evening.

## 2022-10-28 NOTE — Progress Notes (Signed)
Patient ID: Ryan [name], male    DOB: December 04, 1970, 52 y.o.   MRN: [id number]  HPI  Ryan [name] is a 52 y/o male with a history of atrial fibrillation, DM, HTN, DVT, hepatic steatosis, PVD, PE and chronic heart failure.   Echo 04/08/22 showed an EF of 55-60%  Was in the ED 07/30/22 due to nonspecific chest pain.   He presents today for his initial visit with a chief complaint of minimal SOB with moderate exertion. Describes this as chronic in nature. Has associated cough & dizziness along with this. Denies any difficulty sleeping, abdominal distention, palpitations, pedal edema, chest pain or fatigue.   Has scales at home but hasn't been weighing himself daily. Tends to take lasix QOD because of cramping.   Past Medical History:  Diagnosis Date   Arrhythmia    atrial fibrillation   CHF (congestive heart failure) ([place])    Diabetes mellitus without complication ([place])    per pt-pre diabetic-Dr Rosario Jacks stated said pt   DVT (deep venous thrombosis) ([place])    Hepatic steatosis    Hypertension    Morbid obesity with BMI of 50.0-59.9, adult ([place])    Peripheral vascular disease (HCC)    Presence of IVC filter    Pulmonary embolus (HCC)    Ventricular trigeminy    Weakness of both lower limbs    Past Surgical History:  Procedure Laterality Date   CARDIAC ELECTROPHYSIOLOGY STUDY AND ABLATION  10/2020   PERIPHERAL VASCULAR CATHETERIZATION N/A 01/10/2015   Procedure: Dialysis/Perma Catheter Insertion;  Surgeon: Katha Cabal, MD;  Location: [facility];  Service: Cardiovascular;  Laterality: N/A;   PERIPHERAL VASCULAR CATHETERIZATION N/A 02/24/2015   Procedure: Dialysis/Perma Catheter Removal;  Surgeon: Algernon Huxley, MD;  Location: [facility];  Service: Cardiovascular;  Laterality: N/A;   Family History  Problem Relation Age of Onset   Hypertension Mother    Cancer Mother    Breast cancer Mother    Prostate cancer Father    Hypertension Father    Diabetes Father     Pulmonary embolism Paternal Grandfather    Pulmonary embolism Paternal Uncle    Social History   Tobacco Use   Smoking status: Never   Smokeless tobacco: Never  Substance Use Topics   Alcohol use: No   Allergies  Allergen Reactions   Metrizamide Other (See Comments)    Kidney failure requiring dialysis Kidney failure requiring dialysis    Clindamycin/Lincomycin Rash   Lincomycin Rash   Sulfa Antibiotics Rash   Prior to Admission medications   Medication Sig Start Date End Date Taking? Authorizing Provider  albuterol (VENTOLIN HFA) 108 (90 Base) MCG/ACT inhaler Inhale 2 puffs into the lungs every 6 (six) hours as needed for wheezing or shortness of breath. 02/23/22  Yes Emeterio Reeve, DO  furosemide (LASIX) 40 MG tablet Take 40 mg by mouth every other day. 01/28/22  Yes [provider]  losartan (COZAAR) 50 MG tablet Take 1 tablet (50 mg total) by mouth daily. 02/03/22  Yes Jon Billings, NP  magnesium oxide (MAG-OX) 400 (240 Mg) MG tablet Take 400 mg by mouth daily.   Yes [provider]  metoprolol succinate (TOPROL-XL) 25 MG 24 hr tablet Take 1 tablet (25 mg total) by mouth daily. 02/23/22  Yes Emeterio Reeve, DO  tirzepatide [facility]) 5 MG/0.5ML Pen Inject 5 mg into the skin once a week. 10/15/22  Yes Jon Billings, NP  XARELTO 20 MG TABS tablet Take 20 mg  by mouth daily.   Yes [provider]   Review of Systems  Constitutional:  Negative for appetite change and fatigue.  HENT:  Negative for congestion, postnasal drip and sore throat.   Eyes: Negative.   Respiratory:  Positive for cough and shortness of breath. Negative for chest tightness.   Cardiovascular:  Negative for chest pain, palpitations and leg swelling.  Gastrointestinal:  Negative for abdominal distention and abdominal pain.  Endocrine: Negative.   Genitourinary: Negative.   Musculoskeletal:  Negative for back pain and neck pain.  Skin: Negative.   Allergic/Immunologic:  Negative.   Neurological:  Positive for dizziness. Negative for light-headedness.  Hematological:  Negative for adenopathy. Does not bruise/bleed easily.  Psychiatric/Behavioral:  Negative for dysphoric mood and sleep disturbance. The patient is not nervous/anxious.    Vitals:   10/28/22 1339 10/28/22 1359  BP: (!) 141/100 (!) 130/92  Pulse: 81   Resp: 16   SpO2: 93%   Weight: (!) 366 lb (166 kg)   Height: '5\' 7"'$  (1.702 m)    Wt Readings from Last 3 Encounters:  10/28/22 (!) 366 lb (166 kg)  10/15/22 (!) 371 lb 3.2 oz (168.4 kg)  07/15/22 (!) 364 lb 12.8 oz (165.5 kg)   Lab Results  Component Value Date   CREATININE 1.41 (H) 10/15/2022   CREATININE 1.43 (H) 07/30/2022   CREATININE 1.46 (H) 07/15/2022    Physical Exam Vitals and nursing note reviewed.  Constitutional:      Appearance: Normal appearance.  HENT:     Head: Normocephalic and atraumatic.  Cardiovascular:     Rate and Rhythm: Normal rate and regular rhythm.  Pulmonary:     Effort: Pulmonary effort is normal. No respiratory distress.     Breath sounds: No wheezing or rales.  Abdominal:     General: There is no distension.     Palpations: Abdomen is soft.     Tenderness: There is no abdominal tenderness.  Musculoskeletal:        General: No tenderness.     Cervical back: Normal range of motion and neck supple.     Right lower leg: No edema.     Left lower leg: No edema.  Skin:    General: Skin is warm and dry.  Neurological:     General: No focal deficit present.     Mental Status: He is alert and oriented to person, place, and time.  Psychiatric:        Mood and Affect: Mood normal.        Behavior: Behavior normal.        Thought Content: Thought content normal.    Assessment & Plan:  1: Chronic heart failure with preserved ejection fraction- - NYHA class II - euvolemic - has scales and instructed to start weighing daily and call for an overnight weight gain of > 2 pounds or a weekly weight gain  of > 5 pound - not adding salt - furosemide '40mg'$  QOD - losartan '50mg'$  daily; stop this and begin entreso 24/'26mg'$  BID; 30 day voucher provided - metoprolol succinate '25mg'$  daily - consider spironolactone at next visit - recent sepsis so may not be a good candidate for SGLT2 - BMP next visit - BNP 07/04/22 was 74.8  2: HTN- - BP initially 141/100; rechecked was 130/92 - changing losartan to entresto per above - saw PCP Mathis Dad) 10/15/22 - BMP 10/15/22 showed sodium 141, potassium 4.4, creatinine 1.41 & GFR 60  3: Atrial fibrillation- - ablation done  11/2020 - saw cardiology Georgina Peer) 11/12/21  4: History of DVT/ PE- - has IVC filter - xarelto '20mg'$  daily - saw hematology Joan Flores) 03/01/22  5: OSA- - he says that PCP has placed pulmonology referral for possible sleep study   Medication list reviewed.   Return in 3 weeks, sooner if needed

## 2022-11-04 ENCOUNTER — Encounter: Payer: Self-pay | Admitting: Nurse Practitioner

## 2022-11-18 ENCOUNTER — Encounter: Payer: No Typology Code available for payment source | Admitting: Family

## 2022-11-19 ENCOUNTER — Ambulatory Visit: Payer: No Typology Code available for payment source | Admitting: Oncology

## 2022-11-25 ENCOUNTER — Inpatient Hospital Stay: Payer: No Typology Code available for payment source | Admitting: Oncology

## 2022-11-29 ENCOUNTER — Encounter: Payer: No Typology Code available for payment source | Admitting: Family

## 2022-11-29 NOTE — Progress Notes (Deleted)
Patient ID: Ryan [name], male    DOB: 09-15-1970, 52 y.o.   MRN: [id number]  HPI  Mr [name] is a 52 y/o male with a history of atrial fibrillation, DM, HTN, DVT, hepatic steatosis, PVD, PE and chronic heart failure.   Echo 04/08/22: EF of 55-60%  Was in the ED 07/30/22 due to nonspecific chest pain.   He presents today for a HF follow-up visit with a chief complaint of   Past Medical History:  Diagnosis Date   Arrhythmia    atrial fibrillation   CHF (congestive heart failure) ([place])    Diabetes mellitus without complication ([place])    per pt-pre diabetic-Dr Rosario Jacks stated said pt   DVT (deep venous thrombosis) ([place])    Hepatic steatosis    Hypertension    Morbid obesity with BMI of 50.0-59.9, adult ([place])    Peripheral vascular disease ([place])    Presence of IVC filter    Pulmonary embolus (HCC)    Ventricular trigeminy    Weakness of both lower limbs    Past Surgical History:  Procedure Laterality Date   CARDIAC ELECTROPHYSIOLOGY STUDY AND ABLATION  10/2020   PERIPHERAL VASCULAR CATHETERIZATION N/A 01/10/2015   Procedure: Dialysis/Perma Catheter Insertion;  Surgeon: Katha Cabal, MD;  Location: [facility];  Service: Cardiovascular;  Laterality: N/A;   PERIPHERAL VASCULAR CATHETERIZATION N/A 02/24/2015   Procedure: Dialysis/Perma Catheter Removal;  Surgeon: Algernon Huxley, MD;  Location: [facility];  Service: Cardiovascular;  Laterality: N/A;   Family History  Problem Relation Age of Onset   Hypertension Mother    Cancer Mother    Breast cancer Mother    Prostate cancer Father    Hypertension Father    Diabetes Father    Pulmonary embolism Paternal Grandfather    Pulmonary embolism Paternal Uncle    Social History   Tobacco Use   Smoking status: Never   Smokeless tobacco: Never  Substance Use Topics   Alcohol use: No   Allergies  Allergen Reactions   Metrizamide Other (See Comments)    Kidney failure requiring dialysis Kidney failure requiring  dialysis    Clindamycin/Lincomycin Rash   Lincomycin Rash   Sulfa Antibiotics Rash    Review of Systems  Constitutional:  Negative for appetite change and fatigue.  HENT:  Negative for congestion, postnasal drip and sore throat.   Eyes: Negative.   Respiratory:  Positive for cough and shortness of breath. Negative for chest tightness.   Cardiovascular:  Negative for chest pain, palpitations and leg swelling.  Gastrointestinal:  Negative for abdominal distention and abdominal pain.  Endocrine: Negative.   Genitourinary: Negative.   Musculoskeletal:  Negative for back pain and neck pain.  Skin: Negative.   Allergic/Immunologic: Negative.   Neurological:  Positive for dizziness. Negative for light-headedness.  Hematological:  Negative for adenopathy. Does not bruise/bleed easily.  Psychiatric/Behavioral:  Negative for dysphoric mood and sleep disturbance. The patient is not nervous/anxious.       Physical Exam Vitals and nursing note reviewed.  Constitutional:      Appearance: Normal appearance.  HENT:     Head: Normocephalic and atraumatic.  Cardiovascular:     Rate and Rhythm: Normal rate and regular rhythm.  Pulmonary:     Effort: Pulmonary effort is normal. No respiratory distress.     Breath sounds: No wheezing or rales.  Abdominal:     General: There is no distension.     Palpations: Abdomen is soft.  Tenderness: There is no abdominal tenderness.  Musculoskeletal:        General: No tenderness.     Cervical back: Normal range of motion and neck supple.     Right lower leg: No edema.     Left lower leg: No edema.  Skin:    General: Skin is warm and dry.  Neurological:     General: No focal deficit present.     Mental Status: He is alert and oriented to person, place, and time.  Psychiatric:        Mood and Affect: Mood normal.        Behavior: Behavior normal.        Thought Content: Thought content normal.    Assessment & Plan:  1: Chronic heart  failure with preserved ejection fraction- - NYHA class II - euvolemic - has scales and instructed to start weighing daily and call for an overnight weight gain of > 2 pounds or a weekly weight gain of > 5 pound - weight 366 pounds from last visit here 1 month ago - not adding salt - furosemide 40mg  QOD - entresto 24/26mg  BID started at last visit - BMP today - metoprolol succinate 25mg  daily - consider spironolactone at next visit - consider SGLT2 - BNP 07/04/22 was 74.8  2: HTN- - BP  - saw PCP Mathis Dad) 10/15/22 - BMP 10/15/22 showed sodium 141, potassium 4.4, creatinine 1.41 & GFR 60  3: Atrial fibrillation- - ablation done 11/2020 - saw cardiology Georgina Peer) 11/12/21  4: History of DVT/ PE- - has IVC filter - xarelto 20mg  daily - saw hematology Joan Flores) 03/01/22  5: OSA- - he says that PCP has placed pulmonology referral for possible sleep study

## 2022-12-02 ENCOUNTER — Other Ambulatory Visit
Admission: RE | Admit: 2022-12-02 | Discharge: 2022-12-02 | Disposition: A | Discharge status: Home / Self Care | Payer: No Typology Code available for payment source | Source: Ambulatory Visit | Attending: Specialist | Admitting: Specialist

## 2022-12-02 DIAGNOSIS — I272 Pulmonary hypertension, unspecified: Secondary | ICD-10-CM | POA: Insufficient documentation

## 2022-12-02 LAB — BRAIN NATRIURETIC PEPTIDE: B Natriuretic Peptide: 83.8 pg/mL (ref 0.0–100.0)

## 2022-12-13 ENCOUNTER — Encounter
Admission: EM | Disposition: A | Discharge status: Home / Self Care | Payer: Self-pay | Source: Home / Self Care | Attending: Internal Medicine

## 2022-12-13 ENCOUNTER — Emergency Department: Payer: No Typology Code available for payment source

## 2022-12-13 ENCOUNTER — Other Ambulatory Visit: Payer: Self-pay

## 2022-12-13 ENCOUNTER — Encounter: Payer: No Typology Code available for payment source | Admitting: Family

## 2022-12-13 ENCOUNTER — Inpatient Hospital Stay
Admission: EM | Admit: 2022-12-13 | Discharge: 2022-12-17 | DRG: 163 | Disposition: A | Discharge status: Home / Self Care | Payer: No Typology Code available for payment source | Attending: Internal Medicine | Admitting: Internal Medicine

## 2022-12-13 ENCOUNTER — Encounter: Payer: Self-pay | Admitting: Radiology

## 2022-12-13 DIAGNOSIS — J9601 Acute respiratory failure with hypoxia: Secondary | ICD-10-CM | POA: Diagnosis present

## 2022-12-13 DIAGNOSIS — I2782 Chronic pulmonary embolism: Secondary | ICD-10-CM | POA: Diagnosis present

## 2022-12-13 DIAGNOSIS — Z8249 Family history of ischemic heart disease and other diseases of the circulatory system: Secondary | ICD-10-CM | POA: Diagnosis not present

## 2022-12-13 DIAGNOSIS — R0602 Shortness of breath: Secondary | ICD-10-CM | POA: Diagnosis present

## 2022-12-13 DIAGNOSIS — Z833 Family history of diabetes mellitus: Secondary | ICD-10-CM | POA: Diagnosis not present

## 2022-12-13 DIAGNOSIS — N1831 Chronic kidney disease, stage 3a: Secondary | ICD-10-CM | POA: Diagnosis present

## 2022-12-13 DIAGNOSIS — I10 Essential (primary) hypertension: Secondary | ICD-10-CM | POA: Diagnosis present

## 2022-12-13 DIAGNOSIS — Z8042 Family history of malignant neoplasm of prostate: Secondary | ICD-10-CM

## 2022-12-13 DIAGNOSIS — Z803 Family history of malignant neoplasm of breast: Secondary | ICD-10-CM

## 2022-12-13 DIAGNOSIS — Z6841 Body Mass Index (BMI) 40.0 and over, adult: Secondary | ICD-10-CM

## 2022-12-13 DIAGNOSIS — I4892 Unspecified atrial flutter: Secondary | ICD-10-CM | POA: Diagnosis present

## 2022-12-13 DIAGNOSIS — E1169 Type 2 diabetes mellitus with other specified complication: Secondary | ICD-10-CM | POA: Diagnosis not present

## 2022-12-13 DIAGNOSIS — I5032 Chronic diastolic (congestive) heart failure: Secondary | ICD-10-CM | POA: Diagnosis present

## 2022-12-13 DIAGNOSIS — Z95828 Presence of other vascular implants and grafts: Secondary | ICD-10-CM | POA: Diagnosis not present

## 2022-12-13 DIAGNOSIS — N183 Chronic kidney disease, stage 3 unspecified: Secondary | ICD-10-CM | POA: Diagnosis present

## 2022-12-13 DIAGNOSIS — E1151 Type 2 diabetes mellitus with diabetic peripheral angiopathy without gangrene: Secondary | ICD-10-CM | POA: Diagnosis present

## 2022-12-13 DIAGNOSIS — I2699 Other pulmonary embolism without acute cor pulmonale: Secondary | ICD-10-CM | POA: Diagnosis present

## 2022-12-13 DIAGNOSIS — Z79899 Other long term (current) drug therapy: Secondary | ICD-10-CM

## 2022-12-13 DIAGNOSIS — Z7901 Long term (current) use of anticoagulants: Secondary | ICD-10-CM

## 2022-12-13 DIAGNOSIS — I130 Hypertensive heart and chronic kidney disease with heart failure and stage 1 through stage 4 chronic kidney disease, or unspecified chronic kidney disease: Secondary | ICD-10-CM | POA: Diagnosis present

## 2022-12-13 DIAGNOSIS — Z881 Allergy status to other antibiotic agents status: Secondary | ICD-10-CM

## 2022-12-13 DIAGNOSIS — Z86718 Personal history of other venous thrombosis and embolism: Secondary | ICD-10-CM

## 2022-12-13 DIAGNOSIS — E1165 Type 2 diabetes mellitus with hyperglycemia: Secondary | ICD-10-CM | POA: Diagnosis not present

## 2022-12-13 DIAGNOSIS — Z86711 Personal history of pulmonary embolism: Secondary | ICD-10-CM

## 2022-12-13 DIAGNOSIS — I82220 Acute embolism and thrombosis of inferior vena cava: Secondary | ICD-10-CM | POA: Diagnosis not present

## 2022-12-13 DIAGNOSIS — Z7985 Long-term (current) use of injectable non-insulin antidiabetic drugs: Secondary | ICD-10-CM

## 2022-12-13 DIAGNOSIS — K760 Fatty (change of) liver, not elsewhere classified: Secondary | ICD-10-CM | POA: Diagnosis present

## 2022-12-13 DIAGNOSIS — J9621 Acute and chronic respiratory failure with hypoxia: Secondary | ICD-10-CM

## 2022-12-13 DIAGNOSIS — Z882 Allergy status to sulfonamides status: Secondary | ICD-10-CM

## 2022-12-13 DIAGNOSIS — E1122 Type 2 diabetes mellitus with diabetic chronic kidney disease: Secondary | ICD-10-CM | POA: Diagnosis present

## 2022-12-13 DIAGNOSIS — Z888 Allergy status to other drugs, medicaments and biological substances status: Secondary | ICD-10-CM

## 2022-12-13 DIAGNOSIS — Z1152 Encounter for screening for COVID-19: Secondary | ICD-10-CM

## 2022-12-13 DIAGNOSIS — I4891 Unspecified atrial fibrillation: Secondary | ICD-10-CM | POA: Diagnosis present

## 2022-12-13 HISTORY — PX: PULMONARY THROMBECTOMY: CATH118295

## 2022-12-13 LAB — GLUCOSE, CAPILLARY
Glucose-Capillary: 82 mg/dL (ref 70–99)
Glucose-Capillary: 90 mg/dL (ref 70–99)

## 2022-12-13 LAB — CBC
HCT: 49.6 % (ref 39.0–52.0)
Hemoglobin: 16 g/dL (ref 13.0–17.0)
MCH: 30.9 pg (ref 26.0–34.0)
MCHC: 32.3 g/dL (ref 30.0–36.0)
MCV: 95.8 fL (ref 80.0–100.0)
Platelets: 245 10*3/uL (ref 150–400)
RBC: 5.18 MIL/uL (ref 4.22–5.81)
RDW: 14.1 % (ref 11.5–15.5)
WBC: 3.5 10*3/uL — ABNORMAL LOW (ref 4.0–10.5)
nRBC: 0 % (ref 0.0–0.2)

## 2022-12-13 LAB — BASIC METABOLIC PANEL
Anion gap: 8 (ref 5–15)
Anion gap: 7 (ref 5–15)
BUN: 21 mg/dL — ABNORMAL HIGH (ref 6–20)
BUN: 20 mg/dL (ref 6–20)
CO2: 26 mmol/L (ref 22–32)
CO2: 26 mmol/L (ref 22–32)
Calcium: 8.4 mg/dL — ABNORMAL LOW (ref 8.9–10.3)
Calcium: 8 mg/dL — ABNORMAL LOW (ref 8.9–10.3)
Chloride: 105 mmol/L (ref 98–111)
Chloride: 105 mmol/L (ref 98–111)
Creatinine, Ser: 1.48 mg/dL — ABNORMAL HIGH (ref 0.61–1.24)
Creatinine, Ser: 1.4 mg/dL — ABNORMAL HIGH (ref 0.61–1.24)
GFR, Estimated: 56 mL/min — ABNORMAL LOW (ref >60)
GFR, Estimated: >60 mL/min (ref >60)
Glucose, Bld: 97 mg/dL (ref 70–99)
Glucose, Bld: 100 mg/dL — ABNORMAL HIGH (ref 70–99)
Potassium: 4.4 mmol/L (ref 3.5–5.1)
Potassium: 3.6 mmol/L (ref 3.5–5.1)
Sodium: 139 mmol/L (ref 135–145)
Sodium: 138 mmol/L (ref 135–145)

## 2022-12-13 LAB — PROTIME-INR
INR: 1.4 — ABNORMAL HIGH (ref 0.8–1.2)
Prothrombin Time: 17 seconds — ABNORMAL HIGH (ref 11.4–15.2)

## 2022-12-13 LAB — TROPONIN I (HIGH SENSITIVITY)
Troponin I (High Sensitivity): 33 ng/L — ABNORMAL HIGH (ref <18)
Troponin I (High Sensitivity): 240 ng/L (ref <18)

## 2022-12-13 LAB — APTT: aPTT: 60 seconds — ABNORMAL HIGH (ref 24–36)

## 2022-12-13 LAB — RESP PANEL BY RT-PCR (RSV, FLU A&B, COVID)  RVPGX2
Influenza A by PCR: NEGATIVE
Influenza B by PCR: NEGATIVE
Resp Syncytial Virus by PCR: NEGATIVE
SARS Coronavirus 2 by RT PCR: NEGATIVE

## 2022-12-13 LAB — HEPARIN LEVEL (UNFRACTIONATED): Heparin Unfractionated: >1.1 IU/mL — ABNORMAL HIGH (ref 0.30–0.70)

## 2022-12-13 LAB — MAGNESIUM: Magnesium: 1.9 mg/dL (ref 1.7–2.4)

## 2022-12-13 SURGERY — PULMONARY THROMBECTOMY
Anesthesia: Moderate Sedation

## 2022-12-13 MED ORDER — MIDAZOLAM HCL 2 MG/2ML IJ SOLN
INTRAMUSCULAR | Status: DC | PRN
  Administered 2022-12-13: 2 mg via INTRAVENOUS

## 2022-12-13 MED ORDER — MAGNESIUM OXIDE -MG SUPPLEMENT 400 (240 MG) MG PO TABS
400.0000 mg | ORAL_TABLET | Freq: Every day | ORAL | Status: DC
  Administered 2022-12-14 – 2022-12-17 (×4): 400 mg via ORAL
  Filled 2022-12-13 (×5): qty 1

## 2022-12-13 MED ORDER — HYDRALAZINE HCL 20 MG/ML IJ SOLN
INTRAMUSCULAR | Status: AC
  Filled 2022-12-13: qty 1

## 2022-12-13 MED ORDER — BENZONATATE 100 MG PO CAPS
100.0000 mg | ORAL_CAPSULE | Freq: Once | ORAL | Status: AC
  Administered 2022-12-13: 100 mg via ORAL
  Filled 2022-12-13: qty 1

## 2022-12-13 MED ORDER — FAMOTIDINE 20 MG PO TABS: 40.0000 mg | ORAL_TABLET | Freq: Once | ORAL | Status: DC | PRN

## 2022-12-13 MED ORDER — MIDAZOLAM HCL 5 MG/5ML IJ SOLN
INTRAMUSCULAR | Status: AC
  Filled 2022-12-13: qty 5

## 2022-12-13 MED ORDER — CEFAZOLIN SODIUM-DEXTROSE 2-4 GM/100ML-% IV SOLN
INTRAVENOUS | Status: AC
  Filled 2022-12-13: qty 100

## 2022-12-13 MED ORDER — HEPARIN SODIUM (PORCINE) 1000 UNIT/ML IJ SOLN
INTRAMUSCULAR | Status: AC
  Filled 2022-12-13: qty 10

## 2022-12-13 MED ORDER — ONDANSETRON HCL 4 MG PO TABS: 4.0000 mg | ORAL_TABLET | Freq: Four times a day (QID) | ORAL | Status: DC | PRN

## 2022-12-13 MED ORDER — HEPARIN SODIUM (PORCINE) 1000 UNIT/ML IJ SOLN
INTRAMUSCULAR | Status: DC | PRN
  Administered 2022-12-13: 3000 [IU] via INTRAVENOUS

## 2022-12-13 MED ORDER — FENTANYL CITRATE (PF) 100 MCG/2ML IJ SOLN
INTRAMUSCULAR | Status: AC
  Filled 2022-12-13: qty 2

## 2022-12-13 MED ORDER — ONDANSETRON HCL 4 MG/2ML IJ SOLN: 4.0000 mg | Freq: Four times a day (QID) | INTRAMUSCULAR | Status: DC | PRN

## 2022-12-13 MED ORDER — FENTANYL CITRATE (PF) 100 MCG/2ML IJ SOLN
INTRAMUSCULAR | Status: DC | PRN
  Administered 2022-12-13: 50 ug via INTRAVENOUS

## 2022-12-13 MED ORDER — IOHEXOL 350 MG/ML SOLN
75.0000 mL | Freq: Once | INTRAVENOUS | Status: AC | PRN
  Administered 2022-12-13: 75 mL via INTRAVENOUS

## 2022-12-13 MED ORDER — IODIXANOL 320 MG/ML IV SOLN
INTRAVENOUS | Status: DC | PRN
  Administered 2022-12-13: 45 mL

## 2022-12-13 MED ORDER — ALBUTEROL SULFATE (2.5 MG/3ML) 0.083% IN NEBU
INHALATION_SOLUTION | RESPIRATORY_TRACT | Status: AC
  Filled 2022-12-13: qty 3

## 2022-12-13 MED ORDER — GUAIFENESIN-DM 100-10 MG/5ML PO SYRP
5.0000 mL | ORAL_SOLUTION | ORAL | Status: DC | PRN
  Administered 2022-12-13 – 2022-12-16 (×3): 5 mL via ORAL
  Filled 2022-12-13 (×3): qty 10

## 2022-12-13 MED ORDER — METHYLPREDNISOLONE SODIUM SUCC 125 MG IJ SOLR: 125.0000 mg | Freq: Once | INTRAMUSCULAR | Status: DC | PRN

## 2022-12-13 MED ORDER — ALBUTEROL SULFATE (2.5 MG/3ML) 0.083% IN NEBU
2.5000 mg | INHALATION_SOLUTION | Freq: Four times a day (QID) | RESPIRATORY_TRACT | Status: DC | PRN
  Administered 2022-12-13 – 2022-12-15 (×2): 2.5 mg via RESPIRATORY_TRACT
  Filled 2022-12-13: qty 3

## 2022-12-13 MED ORDER — HYDRALAZINE HCL 20 MG/ML IJ SOLN
10.0000 mg | INTRAMUSCULAR | Status: DC | PRN
  Administered 2022-12-13: 10 mg via INTRAVENOUS

## 2022-12-13 MED ORDER — FENTANYL CITRATE PF 50 MCG/ML IJ SOSY: 12.5000 ug | PREFILLED_SYRINGE | Freq: Once | INTRAMUSCULAR | Status: DC | PRN

## 2022-12-13 MED ORDER — HEPARIN BOLUS VIA INFUSION
1700.0000 [IU] | Freq: Once | INTRAVENOUS | Status: AC
  Administered 2022-12-13: 1700 [IU] via INTRAVENOUS
  Filled 2022-12-13: qty 1700

## 2022-12-13 MED ORDER — FUROSEMIDE 40 MG PO TABS
40.0000 mg | ORAL_TABLET | ORAL | Status: DC
  Administered 2022-12-13 – 2022-12-17 (×3): 40 mg via ORAL
  Filled 2022-12-13 (×4): qty 1

## 2022-12-13 MED ORDER — METOPROLOL SUCCINATE ER 50 MG PO TB24
25.0000 mg | ORAL_TABLET | Freq: Every day | ORAL | Status: DC
  Administered 2022-12-14 – 2022-12-17 (×4): 25 mg via ORAL
  Filled 2022-12-13 (×4): qty 1

## 2022-12-13 MED ORDER — HEPARIN BOLUS VIA INFUSION
5000.0000 [IU] | Freq: Once | INTRAVENOUS | Status: AC
  Administered 2022-12-13: 5000 [IU] via INTRAVENOUS
  Filled 2022-12-13: qty 5000

## 2022-12-13 MED ORDER — CEFAZOLIN SODIUM-DEXTROSE 2-4 GM/100ML-% IV SOLN
2.0000 g | INTRAVENOUS | Status: AC
  Administered 2022-12-13: 2 g via INTRAVENOUS

## 2022-12-13 MED ORDER — INSULIN ASPART 100 UNIT/ML IJ SOLN
3.0000 [IU] | Freq: Three times a day (TID) | INTRAMUSCULAR | Status: DC
  Filled 2022-12-13 (×2): qty 1

## 2022-12-13 MED ORDER — DIPHENHYDRAMINE HCL 50 MG/ML IJ SOLN: 50.0000 mg | Freq: Once | INTRAMUSCULAR | Status: DC | PRN

## 2022-12-13 MED ORDER — ALTEPLASE 1 MG/ML SYRINGE FOR VASCULAR PROCEDURE
INTRAMUSCULAR | Status: DC | PRN
  Administered 2022-12-13: 8 mg via INTRA_ARTERIAL

## 2022-12-13 MED ORDER — MIDAZOLAM HCL 2 MG/ML PO SYRP: 8.0000 mg | ORAL_SOLUTION | Freq: Once | ORAL | Status: DC | PRN

## 2022-12-13 MED ORDER — HYDROMORPHONE HCL 1 MG/ML IJ SOLN: 1.0000 mg | Freq: Once | INTRAMUSCULAR | Status: DC | PRN

## 2022-12-13 MED ORDER — INSULIN ASPART 100 UNIT/ML IJ SOLN: 0.0000 [IU] | Freq: Three times a day (TID) | INTRAMUSCULAR | Status: DC

## 2022-12-13 MED ORDER — OXYCODONE HCL 5 MG PO TABS
5.0000 mg | ORAL_TABLET | Freq: Once | ORAL | Status: AC
  Administered 2022-12-13: 5 mg via ORAL
  Filled 2022-12-13: qty 1

## 2022-12-13 MED ORDER — SACUBITRIL-VALSARTAN 24-26 MG PO TABS
1.0000 | ORAL_TABLET | Freq: Two times a day (BID) | ORAL | Status: DC
  Administered 2022-12-13 – 2022-12-17 (×8): 1 via ORAL
  Filled 2022-12-13 (×8): qty 1

## 2022-12-13 MED ORDER — INSULIN ASPART 100 UNIT/ML IJ SOLN: 0.0000 [IU] | Freq: Every day | INTRAMUSCULAR | Status: DC

## 2022-12-13 MED ORDER — HEPARIN (PORCINE) 25000 UT/250ML-% IV SOLN
2000.0000 [IU]/h | INTRAVENOUS | Status: DC
  Administered 2022-12-13: 2000 [IU]/h via INTRAVENOUS
  Administered 2022-12-13: 1800 [IU]/h via INTRAVENOUS
  Administered 2022-12-14 – 2022-12-17 (×6): 2000 [IU]/h via INTRAVENOUS
  Filled 2022-12-13 (×8): qty 250

## 2022-12-13 MED ORDER — SODIUM CHLORIDE 0.9 % IV SOLN: INTRAVENOUS | Status: DC

## 2022-12-13 SURGICAL SUPPLY — 15 items
CANISTER PENUMBRA ENGINE (MISCELLANEOUS) IMPLANT
CATH BEACON 5 .035 65 KMP TIP (CATHETERS) IMPLANT
CATH INDIGO SEP 12 (CATHETERS) IMPLANT
CATH LIGHTNI FLASH 16XTORQ 100 (CATHETERS) IMPLANT
CATH LIGHTNING FLASH XTORQ 100 (CATHETERS) ×1
CATH LITNG FLASH HTORQ 100BERN (CATHETERS) IMPLANT
COVER PROBE ULTRASOUND 5X96 (MISCELLANEOUS) IMPLANT
DERMABOND ADVANCED .7 DNX12 (GAUZE/BANDAGES/DRESSINGS) IMPLANT
DEVICE TORQUE .025-.038 (MISCELLANEOUS) IMPLANT
GLIDEWIRE ADV .035X180CM (WIRE) IMPLANT
PACK ANGIOGRAPHY (CUSTOM PROCEDURE TRAY) ×1 IMPLANT
SHEATH BRITE TIP 6FRX11 (SHEATH) IMPLANT
SHEATH INTRO CHECKFLO 16F 13 (SHEATH) IMPLANT
SHEATH INTRO CHECKFLO 16F 13CM (SHEATH) ×1
WIRE SUPRACORE 190CM (WIRE) IMPLANT

## 2022-12-13 NOTE — Assessment & Plan Note (Signed)
S/p ablation  NSR on EKG today

## 2022-12-13 NOTE — ED Provider Notes (Signed)
[facility] Provider Note    Event Date/Time   First MD Initiated Contact with Patient 12/13/22 [phone]     (approximate)   History   Shortness of Breath and Chest Pain   HPI  Ryan [name] is a 52 y.o. male who presents to the emergency department today because of his concerns for shortness of breath.  Patient has history of PEs and is on blood thinning medication.  Says for the past month or so he has had a dry cough.  Last night however he is cough got worse and he became more short of breath.  Cough is primarily a dry cough however occasionally is productive of phlegm.  The symptoms remind the patient of previous episodes of PEs.  He denies any fevers.     Physical Exam   Triage Vital Signs: ED Triage Vitals [12/13/22 0746]  Enc Vitals Group     BP (!) 159/104     Pulse Rate 85     Resp (!) 23     Temp 98 F (36.7 C)     Temp src      SpO2 (!) 89 %     Weight      Height      Head Circumference      Peak Flow      Pain Score 4     Pain Loc      Pain Edu?      Excl. in GC?     Most recent vital signs: Vitals:   12/13/22 0746  BP: (!) 159/104  Pulse: 85  Resp: (!) 23  Temp: 98 F (36.7 C)  SpO2: (!) 89%   General: Awake, alert, oriented. CV:  Good peripheral perfusion. Regular rate and rhythm. Resp:  Normal effort. Lungs okay. Abd:  No distention.    ED Results / Procedures / Treatments   Labs (all labs ordered are listed, but only abnormal results are displayed) Labs Reviewed  CBC - Abnormal; Notable for the following components:      Result Value   WBC 3.5 (*)    All other components within normal limits  BASIC METABOLIC PANEL - Abnormal; Notable for the following components:   BUN 21 (*)    Creatinine, Ser 1.48 (*)    Calcium 8.4 (*)    GFR, Estimated 56 (*)    All other components within normal limits  TROPONIN I (HIGH SENSITIVITY) - Abnormal; Notable for the following components:   Troponin I (High Sensitivity) 33  (*)    All other components within normal limits  RESP PANEL BY RT-PCR (RSV, FLU A&B, COVID)  RVPGX2  APTT  HEPARIN LEVEL (UNFRACTIONATED)  PROTIME-INR  APTT     EKG  I, Phineas Semen, attending physician, personally viewed and interpreted this EKG  EKG Time: 0747 Rate: 81 Rhythm: normal sinus rhythm Axis: right axis deviation Intervals: qtc 476 QRS: narrow ST changes: no st elevation Impression: abnormal ekg   RADIOLOGY I independently interpreted and visualized the CXR. My interpretation: No pneumonia. No edema.  Radiology interpretation:  IMPRESSION:  No acute cardiopulmonary abnormality.   I independently interpreted and visualized the CT angio. My interpretation: Pulmonary embolisms Radiology interpretation:  IMPRESSION:  1. Positive for Acute On Chronic PE: Right main pulmonary thrombus  superimposed on bulky chronic clot which was seen to be adherent to  the vessel wall on December CTA. New near-occlusion of the right  main pulmonary artery, and evidence of acute thrombus extension  into  upper lobe branches.  2. No pulmonary infarct or pleural effusion at this time. Stable  lung ventilation compared to December.  3. No other acute finding in the chest. Chronic suprarenal IVC  filter appears stable since December.     PROCEDURES:  Critical Care performed: Yes  CRITICAL CARE Performed by: Phineas Semen   Total critical care time: 30 minutes  Critical care time was exclusive of separately billable procedures and treating other patients.  Critical care was necessary to treat or prevent imminent or life-threatening deterioration.  Critical care was time spent personally by me on the following activities: development of treatment plan with patient and/or surrogate as well as nursing, discussions with consultants, evaluation of patient's response to treatment, examination of patient, obtaining history from patient or surrogate, ordering and performing  treatments and interventions, ordering and review of laboratory studies, ordering and review of radiographic studies, pulse oximetry and re-evaluation of patient's condition.   Procedures    MEDICATIONS ORDERED IN ED: Medications - No data to display   IMPRESSION / MDM / ASSESSMENT AND PLAN / ED COURSE  I reviewed the triage vital signs and the nursing notes.                              Differential diagnosis includes, but is not limited to, pneumonia, COVID, PE  Patient's presentation is most consistent with acute presentation with potential threat to life or bodily function.   The patient is on the cardiac monitor to evaluate for evidence of arrhythmia and/or significant heart rate changes.  Patient presents to the emergency department today because of concerns for shortness of breath.  On exam lungs are clear.  He was found to be mildly hypoxic on room air.  Placed on oxygen.  He states he feels better after being placed on oxygen.  Patient has history significant for PEs.  Chest x-ray without pneumonia.  This time will obtain a CT angio to evaluate for new PE.   CT scan is concerning for acute on chronic PE.  Patient with slight elevation of his troponin.  Discussed with Dr. Wyn Quaker with vascular surgery who will evaluate and to determine if thrombectomy will be performed. Started patient on heparin. Discussed with Dr. Alvester Morin with the hospitalist service who will plan on admission.    FINAL CLINICAL IMPRESSION(S) / ED DIAGNOSES   Final diagnoses:  Shortness of breath  Other acute pulmonary embolism, unspecified whether acute cor pulmonale present    Note:  This document was prepared using Dragon voice recognition software and may include unintentional dictation errors.    Phineas Semen, MD 12/13/22 [phone]

## 2022-12-13 NOTE — Op Note (Signed)
[facility]  Percutaneous Study/Intervention Procedural Note   Date of Surgery: 12/13/2022,4:41 PM  Surgeon: Festus Barren  Pre-operative Diagnosis: Symptomatic pulmonary emboli  Post-operative diagnosis:  Same  Procedure(s) Performed:  1.  Contrast injection right heart and into the inferior vena cava to evaluate the retrohepatic vena cava filter for thrombus  2.  Thrombolysis with 8 mg of tPA into the right pulmonary artery   3.  Mechanical thrombectomy right lower lobe, middle lobe, and main pulmonary arteries  4.  Selective catheter placement right lower lobe, middle lobe, and upper lobe pulmonary arteries  5.  Selective catheter placement left main pulmonary artery      Anesthesia: Conscious sedation was administered under my direct supervision by the interventional radiology RN. IV Versed plus fentanyl were utilized. Continuous ECG, pulse oximetry and blood pressure was monitored throughout the entire procedure.  Versed and fentanyl were administered intravenously.  Conscious sedation was administered for a total of 51 minutes using 2 mg of Versed and 50 mcg of Fentanyl.  EBL: 550 cc  Sheath: 16 French right jugular vein  Contrast: 45 cc   Fluoroscopy Time: 15.5 minutes  Indications:  Patient presents with pulmonary emboli. The patient is symptomatic with hypoxemia and dyspnea on exertion.  There is evidence of right heart strain on the CT angiogram. The patient is otherwise a good candidate for intervention and even the long-term benefits pulmonary angiography with thrombolysis is offered. The risks and benefits are reviewed long-term benefits are discussed. All questions are answered patient agrees to proceed.  Procedure:  Ryan [name] is a 52 y.o. male who was identified and appropriate procedural time out was performed.  The patient was then placed supine on the table and prepped and draped in the usual sterile fashion.  Ultrasound was used to evaluate  the right internal jugular vein.  It was patent, as it was echolucent and compressible.  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  A digital ultrasound image was acquired for the permanent record.  A Seldinger needle was used to access the right internal jugular vein under direct ultrasound guidance.  A 0.035 J wire was advanced without resistance and a 5Fr sheath was placed and then later upsized to an 16 [place] sheath.    Initial imaging was performed to the right jugular sheath and bolus injection of contrast was utilized to demonstrate the right ventricle and the pulmonary artery outflow.  Before addressing the pulmonary embolus, placed the Kumpe catheter down into the inferior vena cava just below the retrohepatic vena cava filter.  The mid infrarenal vena cava was occluded.  Imaging was performed to evaluate if there was a large volume thrombus on the retrohepatic cava that could be causing the persistent pulmonary embolus.  Imaging the IVC showed the retropelvic IVC and existing filter in the retrohepatic vena cava to be patent with no significant thrombus burden present.  I then turned my attention to the pulmonary embolus.  The advantage wire and Kumpe catheter were then negotiated into the main pulmonary artery and then into the left main pulmonary artery where hand injection of contrast was utilized to demonstrate the pulmonary arteries and confirm the locations of the pulmonary emboli. This demonstrated no significant pulmonary embolus in the left main, upper, or lower lobe pulmonary arteries.  I then negotiated the Kumpe catheter using the advantage wire up to the right side.  I cannulated first the right lower lobe, then the right middle  lobe and upper lobe pulmonary arteries and selective imaging was performed. This demonstrated extensive thrombus burden in the right main pulmonary artery extending into all 3 lobar branches.  3000 units of heparin was then given and allowed to  circulate.  TPA was reconstituted and delivered onto the table. A total of 8 milligrams of TPA was utilized, all on the right side.    The Penumbra Cat 16 Flash catheter was then advanced up into the pulmonary vasculature. The right lung was addressed first. Catheter was negotiated into the right lower lobe pulmonary artery and mechanical thrombectomy was performed.  The catheter was then directed into the right middle lobe pulmonary artery and then back into the main pulmonary artery.  From a jugular approach, getting this large catheter up into the right upper lobe pulmonary artery was not technically possible.  The separator was used.  Completion imaging showed improvement but there was still significant amount of thrombus in the distal right main pulmonary artery that was largely chronic in nature.   After review these images wires were reintroduced and the catheters removed. Then, the sheath is then pulled and pressures held. A sterile dressing is placed.    Findings:   Right heart imaging:  Right atrium and right ventricle and the pulmonary outflow tract appears dilated  Right lung:  This demonstrated extensive thrombus burden in the right main pulmonary artery extending into all 3 lobar branches.  Left lung: This demonstrated no significant pulmonary embolus in the left main, upper, or lower lobe pulmonary arteries    Disposition: Patient was taken to the recovery room in stable condition having tolerated the procedure well.  Festus Barren 12/13/2022,4:41 PM

## 2022-12-13 NOTE — ED Triage Notes (Signed)
Pt comes with c/o sob and dull pain in chest. Pt states nagging cough. Pt states trouble breathing last night. Pt states no sleep. Pt states hx of PE and HTN.  Pt has labored breathing noted and accessory muscle use.

## 2022-12-13 NOTE — Consult Note (Signed)
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  Allergies  Allergen Reactions   Metrizamide Other (See Comments)    Kidney failure requiring dialysis Kidney failure requiring dialysis    Clindamycin/Lincomycin Rash   Lincomycin Rash   Sulfa Antibiotics Rash    Patient Measurements: Weight: (!) 165.5 kg (364 lb 13.8 oz) Heparin Dosing Weight: 110.6 kg  Vital Signs: Temp: 97.6 F (36.4 C) (04/15 1945) Temp Source: Oral (04/15 1945) BP: 132/85 (04/15 2055) Pulse Rate: 96 (04/15 1945)  Labs: Recent Labs    12/13/22 0808 12/13/22 1130 12/13/22 1956  HGB 16.0  --   --   HCT 49.6  --   --   PLT 245  --   --   APTT  --   --  60*  LABPROT  --   --  17.0*  INR  --   --  1.4*  HEPARINUNFRC  --   --  >1.10*  CREATININE  --  1.48*  --   TROPONINIHS 33*  --  240*     Estimated Creatinine Clearance: 86.5 mL/min (A) (by C-G formula based on SCr of 1.48 mg/dL (H)).   Medical History: Past Medical History:  Diagnosis Date   Arrhythmia    atrial fibrillation   CHF (congestive heart failure)    Diabetes mellitus without complication    per pt-pre diabetic-Dr Dario Guardian stated said pt   DVT (deep venous thrombosis)    Hepatic steatosis    Hypertension    Morbid obesity with BMI of 50.0-59.9, adult    Peripheral vascular disease    Presence of IVC filter    Pulmonary embolus    Ventricular trigeminy    Weakness of both lower limbs     Medications:  Patient takes Xarelto 20 mg po daily at home, reports last dose 12/12/22 @ 2100.  Pt told pharmacist he did vomit last night after taking dose.  Assessment: 52 yo male presented to ED with shortness of breath.  Patient currently on Xarelto at home for history of PE.  Chest CT was positive for acute on chronic PE.  Pharmacy consulted to initiate heparin infusion.  4/15 1956 aPTT 60 HL > 1.1   Goal of Therapy:  Heparin level 0.3-0.7 units/ml when heparin level and aPTT  correlate.  aPTT 66-102 seconds Monitor platelets by anticoagulation protocol: Yes   Plan:  aPTT is slightly subtherapeutic. Will give heparin bolus of 1700 units x 1 and increase the heparin infusion to 2000 units/hr. Recheck aPTT in 6 hours and recheck heparin level and CBC with AM labs. Once aPTT and heparin level correlate switch to heparin level monitoring.   Ronnald Ramp, PharmD 12/13/2022,9:19 PM

## 2022-12-13 NOTE — Assessment & Plan Note (Signed)
BP stable Titrate home regimen 

## 2022-12-13 NOTE — Consult Note (Signed)
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  Allergies  Allergen Reactions   Metrizamide Other (See Comments)    Kidney failure requiring dialysis Kidney failure requiring dialysis    Clindamycin/Lincomycin Rash   Lincomycin Rash   Sulfa Antibiotics Rash    Patient Measurements: Weight: (!) 176.2 kg (388 lb 6.4 oz) Heparin Dosing Weight: 110.6 kg  Vital Signs: Temp: 98.1 F (36.7 C) (04/15 1155) BP: 151/102 (04/15 1304) Pulse Rate: 70 (04/15 1304)  Labs: Recent Labs    12/13/22 0808 12/13/22 1130  HGB 16.0  --   HCT 49.6  --   PLT 245  --   CREATININE  --  1.48*  TROPONINIHS 33*  --     Estimated Creatinine Clearance: 89.9 mL/min (A) (by C-G formula based on SCr of 1.48 mg/dL (H)).   Medical History: Past Medical History:  Diagnosis Date   Arrhythmia    atrial fibrillation   CHF (congestive heart failure)    Diabetes mellitus without complication    per pt-pre diabetic-Dr Dario Guardian stated said pt   DVT (deep venous thrombosis)    Hepatic steatosis    Hypertension    Morbid obesity with BMI of 50.0-59.9, adult    Peripheral vascular disease    Presence of IVC filter    Pulmonary embolus    Ventricular trigeminy    Weakness of both lower limbs     Medications:  Patient takes Xarelto 20 mg po daily at home, reports last dose 12/12/22 @ 2100.  Pt told pharmacist he did vomit last night after taking dose.  Assessment: 52 yo male presented to ED with shortness of breath.  Patient currently on Xarelto at home for history of PE.  Chest CT was positive for acute on chronic PE.  Pharmacy consulted to initiate heparin infusion.  Baseline labs: hgb 16, plt 245, aPTT pending, INR pending, HL pending Goal of Therapy:  Heparin level 0.3-0.7 units/ml aPTT 66-102 seconds Monitor platelets by anticoagulation protocol: Yes   Plan:  --Spoke with provider given new PE while on Xarelto wishes to  bolus --Give heparin 5000 units bolus x 1 --Start heparin infusion at 1800 units/hr --Check aPTT level in 6 hours and daily while on heparin --Will adjust based on aPTT until correlation with HL --Continue to monitor H&H and platelets  Barrie Folk, PharmD 12/13/2022,1:47 PM

## 2022-12-13 NOTE — ED Notes (Signed)
Pt placed on 2L [place] at this time. 

## 2022-12-13 NOTE — Consult Note (Signed)
Hospital Consult    Reason for Consult:  Pulmonary embolism Acute vs Chronic Requesting Physician:  Dr Phineas Semen MD MRN #:  [id number]  History of Present Illness: This is a 52 y.o. male who presents to [facility] department at 7:00 this morning for what he describes as increasing shortness of breath.  Patient states that he has had pulmonary embolisms in the past as well as DVTs multiple times in the past and when he gets short of breath like this it replicates prior PE admissions.  Patient states that the difference is when he cannot catch his breath no matter how deep he tries to inspire.  Patient was just seen by [facility] pulmonary group here at [facility] clinic.  He presented there on 12/02/2022 with an increase in dyspnea.  He is overweight, marginal saturations and probably has untreated sleep apnea as suggested 2 years ago with no treatment.  Also had multiple emboli attic events.  They requested at that time that he start a weight loss program, restart oxygen at home with activity, get a home sleep study versus in the lab, right heart scan, VQ scan, and follow-up back with cardiology at [facility] after his sleep study with pulmonary function test.  On exam this afternoon in the emergency department patient is uncomfortable laying on a stretcher.  He is on 6 L nasal cannula oxygen satting 90%.  Planes of chest pain on the left-hand side.  He reports that is not radiating down his arm.  He states that this feels like the last time he had a pulmonary embolism.  Patient endorses taking his Xarelto 20 mg daily.  Patient remains morbidly obese.  Denies any other pain at this time.  Patient is hypertensive in the emergency department.  Last blood pressure was 200/115.  Patient appears to be in atrial fibrillation on the monitor.  Past Medical History:  Diagnosis Date   Arrhythmia    atrial fibrillation   CHF (congestive heart failure)    Diabetes mellitus without complication    per pt-pre diabetic-Dr  Dario Guardian stated said pt   DVT (deep venous thrombosis)    Hepatic steatosis    Hypertension    Morbid obesity with BMI of 50.0-59.9, adult    Peripheral vascular disease    Presence of IVC filter    Pulmonary embolus    Ventricular trigeminy    Weakness of both lower limbs     Past Surgical History:  Procedure Laterality Date   CARDIAC ELECTROPHYSIOLOGY STUDY AND ABLATION  10/2020   PERIPHERAL VASCULAR CATHETERIZATION N/A 01/10/2015   Procedure: Dialysis/Perma Catheter Insertion;  Surgeon: Renford Dills, MD;  Location: [facility] INVASIVE CV LAB;  Service: Cardiovascular;  Laterality: N/A;   PERIPHERAL VASCULAR CATHETERIZATION N/A 02/24/2015   Procedure: Dialysis/Perma Catheter Removal;  Surgeon: Annice Needy, MD;  Location: [facility] INVASIVE CV LAB;  Service: Cardiovascular;  Laterality: N/A;    Allergies  Allergen Reactions   Metrizamide Other (See Comments)    Kidney failure requiring dialysis Kidney failure requiring dialysis    Clindamycin/Lincomycin Rash   Lincomycin Rash   Sulfa Antibiotics Rash    Prior to Admission medications   Medication Sig Start Date End Date Taking? Authorizing Provider  albuterol (VENTOLIN HFA) 108 (90 Base) MCG/ACT inhaler Inhale 2 puffs into the lungs every 6 (six) hours as needed for wheezing or shortness of breath. 02/23/22   Sunnie Nielsen, DO  furosemide (LASIX) 40 MG tablet Take 40 mg by mouth every other day. 01/28/22  [provider]  magnesium oxide (MAG-OX) 400 (240 Mg) MG tablet Take 400 mg by mouth daily.    [provider]  metoprolol succinate (TOPROL-XL) 25 MG 24 hr tablet Take 1 tablet (25 mg total) by mouth daily. 02/23/22   Sunnie Nielsen, DO  sacubitril-valsartan (ENTRESTO) 24-26 MG Take 1 tablet by mouth 2 (two) times daily. 10/28/22   Delma Freeze, FNP  tirzepatide [facility] 5 MG/0.5ML Pen Inject 5 mg into the skin once a week. 10/15/22   [name], NP  XARELTO 20 MG TABS tablet Take 20 mg by mouth  daily.    [provider]    Social History   Socioeconomic History   Marital status: Married    Spouse name: Not on file   Number of children: Not on file   Years of education: Not on file   Highest education level: Not on file  Occupational History   Not on file  Tobacco Use   Smoking status: Never   Smokeless tobacco: Never  Vaping Use   Vaping Use: Never used  Substance and Sexual Activity   Alcohol use: No   Drug use: No   Sexual activity: Yes  Other Topics Concern   Not on file  Social History Narrative   Not on file   Social Determinants of Health   Financial Resource Strain: Not on file  Food Insecurity: Not on file  Transportation Needs: Not on file  Physical Activity: Not on file  Stress: Not on file  Social Connections: Not on file  Intimate Partner Violence: Not on file     Family History  Problem Relation Age of Onset   Hypertension Mother    Cancer Mother    Breast cancer Mother    Prostate cancer Father    Hypertension Father    Diabetes Father    Pulmonary embolism Paternal Grandfather    Pulmonary embolism Paternal Uncle     ROS: Otherwise negative unless mentioned in HPI  Physical Examination  Vitals:   12/13/22 1330 12/13/22 1400  BP: (!) 155/109 (!) 154/108  Pulse: 63 72  Resp: 18 20  Temp:    SpO2: 90% 91%   Body mass index is 60.83 kg/m.  General:  WDWN in NAD Gait: Not observed HENT: WNL, normocephalic Pulmonary: normal non-labored breathing, without Rales, rhonchi,  wheezing Cardiac: irregular, without  Murmurs, rubs or gallops; without carotid bruits Abdomen: Positive bowel sounds, soft, NT/ND, no masses Skin: without rashes Vascular Exam/Pulses: Unable to palpate bilateral lower extremity pulses due to venous stasis and edema.  Extremities: without ischemic changes, without Gangrene , without cellulitis; without open wounds;  Musculoskeletal: no muscle wasting or atrophy  Neurologic: A&O X 3;  No focal  weakness or paresthesias are detected; speech is fluent/normal Psychiatric:  The pt has Normal affect. Lymph:  Unremarkable  CBC    Component Value Date/Time   WBC 3.5 (L) 12/13/2022 0808   RBC 5.18 12/13/2022 0808   HGB 16.0 12/13/2022 0808   HGB 15.9 10/15/2022 1005   HCT 49.6 12/13/2022 0808   HCT 48.4 10/15/2022 1005   PLT 245 12/13/2022 0808   PLT 280 10/15/2022 1005   MCV 95.8 12/13/2022 0808   MCV 93 10/15/2022 1005   MCV 90 12/28/2014 0444   MCH 30.9 12/13/2022 0808   MCHC 32.3 12/13/2022 0808   RDW 14.1 12/13/2022 0808   RDW 13.9 10/15/2022 1005   RDW 14.0 12/28/2014 0444   LYMPHSABS 1.4 10/15/2022 1005  LYMPHSABS 1.3 12/28/2014 0444   MONOABS 0.6 07/04/2022 0631   MONOABS 2.1 (H) 12/28/2014 0444   EOSABS 0.2 10/15/2022 1005   EOSABS 0.1 12/28/2014 0444   BASOSABS 0.0 10/15/2022 1005   BASOSABS 0.1 12/28/2014 0444    BMET    Component Value Date/Time   NA 139 12/13/2022 1130   NA 141 10/15/2022 1005   NA 130 (L) 12/28/2014 1602   K 4.4 12/13/2022 1130   K 3.8 12/28/2014 1602   CL 105 12/13/2022 1130   CL 94 (L) 12/28/2014 1602   CO2 26 12/13/2022 1130   CO2 24 12/28/2014 1602   GLUCOSE 97 12/13/2022 1130   GLUCOSE 121 (H) 12/28/2014 1602   BUN 21 (H) 12/13/2022 1130   BUN 12 10/15/2022 1005   BUN 57 (H) 12/28/2014 1602   CREATININE 1.48 (H) 12/13/2022 1130   CREATININE 5.55 (H) 12/28/2014 1602   CALCIUM 8.4 (L) 12/13/2022 1130   CALCIUM 8.1 (L) 12/28/2014 1602   GFRNONAA 56 (L) 12/13/2022 1130   GFRNONAA 11 (L) 12/28/2014 1602   GFRAA 55 (L) 03/07/2020 2009   GFRAA 13 (L) 12/28/2014 1602    COAGS: Lab Results  Component Value Date   INR 1.5 (H) 04/06/2022   INR 1.4 (H) 04/06/2022   INR 1.5 (H) 02/20/2022     Non-Invasive Vascular Imaging:   EXAM: CT ANGIOGRAPHY CHEST WITH CONTRAST  IMPRESSION: 1. Positive for Acute On Chronic PE: Right main pulmonary thrombus superimposed on bulky chronic clot which was seen to be adherent to the  vessel wall on December CTA. New near-occlusion of the right main pulmonary artery, and evidence of acute thrombus extension into upper lobe branches. 2. No pulmonary infarct or pleural effusion at this time. Stable lung ventilation compared to December. 3. No other acute finding in the chest. Chronic suprarenal IVC filter appears stable since December.  Statin:  No. Beta Blocker:  Yes.   Aspirin:  No. ACEI:  No. ARB:  No. CCB use:  Yes Other antiplatelets/anticoagulants:  Yes.   Xarelto 20 mg Daily    ASSESSMENT/PLAN: This is a 51 y.o. male who presents to [facility] emergency department this morning with increased shortness of breath.  Upon workup the chest CT with PE protocol shows patient has acute over imposed on chronic pulmonary embolism clot.  Thrombus noted to be on the right main pulmonary artery with extension into the upper lobe branches.  PLAN: Vascular surgery will take the patient to the vascular lab this afternoon for a pulmonary thrombectomy.  Due to the patient's extreme obesity and lower extremity swelling patient will need to be accessed through his neck.  I had a long detailed discussion with the patient regarding the procedure, benefits, complications, and risks.  Patient verbalizes understanding and says he has had this multiple times before.  He would like to proceed as soon as possible due to his current shortness of breath.  All the patient's questions were answered this afternoon.  States he has been n.p.o. since midnight last night.   -I discussed the plan in detail with Dr. Festus Barren MD and he is in agreement with the plan.   Marcie Bal [facility] 12/13/2022 2:19 PM

## 2022-12-13 NOTE — Assessment & Plan Note (Signed)
Decompensated resp status with imaging showing acute On Chronic PE: Right main pulmonary thrombus superimposed on bulky chronic clot which was seen to be adherent to the vessel wall on December CTA. New near-occlusion of the right main pulmonary artery, and evidence of acute thrombus extension into upper lobe branches Baseline DVT/PE s/p IVC filter x2 2005, 2012 on xarelto  Recurrent issue with noted admission 03/2022 for similar presentation  ? Partial compliance (pt reports missed dose yesterday 2/2 vomiting- somewhat ambiguous about strict daily adherence)  S/p thrombectomy w/ vascular surgery  Cont heparin gtt  Supplemental O2  Follow up vascular surgery recs.

## 2022-12-13 NOTE — H&P (Addendum)
History and Physical    Patient: Ryan [name]:[id number] DOB: 08/19/1971 DOA: 12/13/2022 DOS: the patient was seen and examined on 12/13/2022 PCP: [name], NP  Patient coming from: Home  Chief Complaint:  Chief Complaint  Patient presents with   Shortness of Breath   Chest Pain   HPI: Ryan [name] is a 52 y.o. 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  Patient reports increased work of breathing and cough over the past 2 to 3 weeks.  Noted history of recurrent PE with most recent admission August 2023.  Was placed on IV anticoagulation x 2 days and DC'd with Xarelto.  Patient states he has been relatively compliant with Xarelto.  Did miss dosing yesterday in the setting of vomiting.  Positive shortness of breath and increased work of breathing with ambulation.  No abdominal pain.  No hemiparesis or confusion.  No reported tobacco use.  States he was evaluated by his pulmonologist within the past week.  There was a question of him being on medication that can help possibly dissolve clot in the lungs per report.  Chronic lower extremity swelling which is stable. Presented to the ER afebrile, hemodynamically stable.  Satting in the upper 80s on room air, transition to 6 L nasal cannula to keep O2 sats greater than 92%.  White count 3.5, hemoglobin 16, platelets 245, creatinine 1.5, COVID flu and RSV negative.  Troponin 33.  CTA of the chest with acute on chronic PE with right main pulmonary thrombus superimposed on bulky chronic clot which is seen to be adherent to the vessel wall from December CT angio of the chest.  New near occlusion of the right main pulmonary artery and evidence of acute thrombus extension into the upper lobe branches.  Vascular surgery consulted with patient receiving  thrombectomy today. Review of Systems: As mentioned in the history of present illness. All other systems reviewed and are negative. Past Medical History:  Diagnosis Date   Arrhythmia    atrial fibrillation   CHF (congestive heart failure)    Diabetes mellitus without complication    per pt-pre diabetic-Dr Dario Guardian stated said pt   DVT (deep venous thrombosis)    Hepatic steatosis    Hypertension    Morbid obesity with BMI of 50.0-59.9, adult    Peripheral vascular disease    Presence of IVC filter    Pulmonary embolus    Ventricular trigeminy    Weakness of both lower limbs    Past Surgical History:  Procedure Laterality Date   CARDIAC ELECTROPHYSIOLOGY STUDY AND ABLATION  10/2020   PERIPHERAL VASCULAR CATHETERIZATION N/A 01/10/2015   Procedure: Dialysis/Perma Catheter Insertion;  Surgeon: Renford Dills, MD;  Location: [facility] INVASIVE CV LAB;  Service: Cardiovascular;  Laterality: N/A;   PERIPHERAL VASCULAR CATHETERIZATION N/A 02/24/2015   Procedure: Dialysis/Perma Catheter Removal;  Surgeon: Annice Needy, MD;  Location: [facility] INVASIVE CV LAB;  Service: Cardiovascular;  Laterality: N/A;   Social History:  reports that he has never smoked. He has never used smokeless tobacco. He reports that he does not drink alcohol and does not use drugs.  Allergies  Allergen Reactions   Metrizamide Other (See Comments)    Kidney failure requiring dialysis Kidney failure requiring dialysis    Clindamycin/Lincomycin Rash   Lincomycin Rash   Sulfa Antibiotics Rash  Family History  Problem Relation Age of Onset   Hypertension Mother    Cancer Mother    Breast cancer Mother    Prostate cancer Father    Hypertension Father    Diabetes Father    Pulmonary embolism Paternal Grandfather    Pulmonary embolism Paternal Uncle     Prior to Admission medications   Medication Sig Start Date End Date Taking? Authorizing Provider  MOUNJARO 2.5 MG/0.5ML Pen Inject 2.5 mg into the skin once a  week. 11/07/22  Yes [provider]  albuterol (VENTOLIN HFA) 108 (90 Base) MCG/ACT inhaler Inhale 2 puffs into the lungs every 6 (six) hours as needed for wheezing or shortness of breath. 02/23/22   Sunnie Nielsen, DO  furosemide (LASIX) 40 MG tablet Take 40 mg by mouth every other day. 01/28/22   [provider]  magnesium oxide (MAG-OX) 400 (240 Mg) MG tablet Take 400 mg by mouth daily.    [provider]  metoprolol succinate (TOPROL-XL) 25 MG 24 hr tablet Take 1 tablet (25 mg total) by mouth daily. 02/23/22   Sunnie Nielsen, DO  sacubitril-valsartan (ENTRESTO) 24-26 MG Take 1 tablet by mouth 2 (two) times daily. 10/28/22   Delma Freeze, FNP  tirzepatide [facility]) 5 MG/0.5ML Pen Inject 5 mg into the skin once a week. 10/15/22   [name], NP  XARELTO 20 MG TABS tablet Take 20 mg by mouth daily.    [provider]    Physical Exam: Vitals:   12/13/22 1658 12/13/22 1700 12/13/22 1705 12/13/22 1730  BP:  (!) 163/129 (!) 160/110 (!) 163/120  Pulse:  71 70 75  Resp:  (!) 21 (!) 29 (!) 22  Temp:      TempSrc:      SpO2: 95% 94% 92% 91%  Weight:       Physical Exam Constitutional:      Appearance: He is obese.  HENT:     Head: Normocephalic and atraumatic.     Nose: Nose normal.     Mouth/Throat:     Mouth: Mucous membranes are moist.  Eyes:     Pupils: Pupils are equal, round, and reactive to light.  Cardiovascular:     Rate and Rhythm: Normal rate and regular rhythm.  Pulmonary:     Effort: Pulmonary effort is normal.     Breath sounds: Normal breath sounds.  Abdominal:     General: Bowel sounds are normal.     Comments: Obese abdomen    Musculoskeletal:        General: Normal range of motion.     Cervical back: Normal range of motion.  Skin:    General: Skin is warm.  Neurological:     General: No focal deficit present.  Psychiatric:        Mood and Affect: Mood normal.     Data Reviewed:  There are no new results  to review at this time. PERIPHERAL VASCULAR CATHETERIZATION See surgical note for result. CT Angio Chest PE W and/or Wo Contrast CLINICAL DATA:  52 year old male with shortness of breath and dull chest pain. Persistent cough. History of previous pulmonary embolus.  EXAM: CT ANGIOGRAPHY CHEST WITH CONTRAST  TECHNIQUE: Multidetector CT imaging of the chest was performed using the standard protocol during bolus administration of intravenous contrast. Multiplanar CT image reconstructions and MIPs were obtained to evaluate the vascular anatomy.  RADIATION DOSE REDUCTION: This exam was performed according to the departmental dose-optimization program which includes automated exposure control,  adjustment of the mA and/or kV according to patient size and/or use of iterative reconstruction technique.  CONTRAST:  73mL OMNIPAQUE IOHEXOL 350 MG/ML SOLN  COMPARISON:  CTA chest 07/30/2022 and earlier.  FINDINGS: Cardiovascular: Adequate contrast bolus timing in the pulmonary arterial tree. Bulky but chronic, previous chronic adherent and mostly nonocclusive thrombus in the right main pulmonary artery, but progressed and now near occlusive clot there on series 7, image 88. New clot from that segment tracking into right upper lobe branches.  No saddle embolus.  Respiratory motion limits detail of the right lower lobe pulmonary arteries, but chronic mural thrombus there appears fairly stable since December.  Left main pulmonary artery remains patent. Left lobar and segmental arteries appear to be patent.  Stable heart size. No pericardial effusion. Early contrast in the aorta which appears stable and negative. Chronic anterior chest wall venous collaterals appear stable since December and are not enhancing in the early contrast phase.  Mediastinum/Nodes: Negative for mediastinal mass or lymphadenopathy.  Lungs/Pleura: Major airways remain patent. Respiratory motion more so in the  lower lobes. Stable lung volumes. No pleural effusion. No pulmonary infarct at this time. Stable ventilation compared to December.  Upper Abdomen: Chronic suprarenal IVC filter appears stable since December.  Stable and negative visible essentially noncontrast liver, gallbladder, spleen, pancreas, adrenal glands, kidneys, and bowel. Ventral abdominal wall venous collaterals continue from the chest and appears stable.  Musculoskeletal: No acute osseous abnormality identified. Mid to lower thoracic flowing endplate osteophytes resulting in some levels of chronic interbody ankylosis.  Review of the MIP images confirms the above findings.  IMPRESSION: 1. Positive for Acute On Chronic PE: Right main pulmonary thrombus superimposed on bulky chronic clot which was seen to be adherent to the vessel wall on December CTA. New near-occlusion of the right main pulmonary artery, and evidence of acute thrombus extension into upper lobe branches. 2. No pulmonary infarct or pleural effusion at this time. Stable lung ventilation compared to December. 3. No other acute finding in the chest. Chronic suprarenal IVC filter appears stable since December.  Critical Value/emergent results were called by telephone at the time of interpretation on 12/13/2022 at 1:07 pm to Dr. Phineas Semen , who verbally acknowledged these results.  Electronically Signed   By: Odessa Fleming M.D.   On: 12/13/2022 13:10 DG Chest Port 1 View CLINICAL DATA:  Shortness of breath  EXAM: PORTABLE CHEST 1 VIEW  COMPARISON:  Chest x-ray July 30, 2022  FINDINGS: The cardiomediastinal silhouette is unchanged in contour. No focal pulmonary opacity. No pleural effusion or pneumothorax. The visualized upper abdomen is unremarkable. No acute osseous abnormality.  IMPRESSION: No acute cardiopulmonary abnormality.  Electronically Signed   By: Jacob Moores M.D.   On: 12/13/2022 08:11  Lab Results  Component Value  Date   WBC 3.5 (L) 12/13/2022   HGB 16.0 12/13/2022   HCT 49.6 12/13/2022   MCV 95.8 12/13/2022   PLT 245 12/13/2022   Last metabolic panel Lab Results  Component Value Date   GLUCOSE 97 12/13/2022   NA 139 12/13/2022   K 4.4 12/13/2022   CL 105 12/13/2022   CO2 26 12/13/2022   BUN 21 (H) 12/13/2022   CREATININE 1.48 (H) 12/13/2022   GFRNONAA 56 (L) 12/13/2022   CALCIUM 8.4 (L) 12/13/2022   PHOS 2.2 (L) 04/08/2022   PROT 7.5 10/15/2022   ALBUMIN 4.4 10/15/2022   LABGLOB 3.1 10/15/2022   AGRATIO 1.4 10/15/2022   BILITOT 0.5 10/15/2022   ALKPHOS  67 10/15/2022   AST 17 10/15/2022   ALT 19 10/15/2022   ANIONGAP 8 12/13/2022    Assessment and Plan: Acute respiratory failure with hypoxia New O2 requirement up to 6 L nasal cannula in the setting of significant clot burden with baseline recurrent PE as well as morbid obesity Recurrent issue  Status post thrombectomy by vascular surgery IV heparin drip Continue supplemental oxygen Follow respiratory status closely  Pulmonary emboli Decompensated resp status with imaging showing acute On Chronic PE: Right main pulmonary thrombus superimposed on bulky chronic clot which was seen to be adherent to the vessel wall on December CTA. New near-occlusion of the right main pulmonary artery, and evidence of acute thrombus extension into upper lobe branches Baseline DVT/PE s/p IVC filter x2 2005, 2012 on xarelto  Recurrent issue with noted admission 03/2022 for similar presentation  ? Partial compliance (pt reports missed dose yesterday 2/2 vomiting- somewhat ambiguous about strict daily adherence)  S/p thrombectomy w/ vascular surgery  Cont heparin gtt  Supplemental O2  Follow up vascular surgery recs.    CKD (chronic kidney disease), stage IIIa Baseline creatinine around 1.5 Creatinine 1.5 today Follow  Type 2 diabetes mellitus with diabetic chronic kidney disease SSI A1c  Hypertension BP stable Titrate home  regimen  Chronic diastolic CHF (congestive heart failure) 2D echo August 2023 with EF 55 to 60% Large body habitus in setting of morbid obesity Does appear to be fairly euvolemic at present No cardiomegaly or pulmonary congestion on imaging Will monitor for now Continue home Lasix Follow-up  Atrial flutter with rapid ventricular response S/p ablation  NSR on EKG today       Advance Care Planning:   Code Status: Full Code   Consults: Vascular Surgery w/ Dr. Wyn Quaker   Family Communication: No family at the bedside  Greater than 50% was spent in counseling and coordination of care with patient Total encounter time 80 minutes or more   Severity of Illness: The appropriate patient status for this patient is INPATIENT. Inpatient status is judged to be reasonable and necessary in order to provide the required intensity of service to ensure the patient's safety. The patient's presenting symptoms, physical exam findings, and initial radiographic and laboratory data in the context of their chronic comorbidities is felt to place them at high risk for further clinical deterioration. Furthermore, it is not anticipated that the patient will be medically stable for discharge from the hospital within 2 midnights of admission.   * I certify that at the point of admission it is my clinical judgment that the patient will require inpatient hospital care spanning beyond 2 midnights from the point of admission due to high intensity of service, high risk for further deterioration and high frequency of surveillance required.*  Author: Floydene Flock, MD 12/13/2022 5:46 PM  For on call review [email or web address].

## 2022-12-13 NOTE — Assessment & Plan Note (Signed)
SSI A1c 

## 2022-12-13 NOTE — Assessment & Plan Note (Addendum)
Decompensated resp status with imaging showing acute On Chronic PE: Right main pulmonary thrombus superimposed on bulky chronic clot which was seen to be adherent to the vessel wall on December CTA. New near-occlusion of the right main pulmonary artery, and evidence of acute thrombus extension into upper lobe branches Baseline DVT/PE s/p IVC filter x2 2005, 2012  Recurrent issue with noted admission 03/2022 for similar presentation  ? Partial compliance (pt reports missed dose yesterday 2/2 vomiting- somewhat ambiguous about strict daily adherence)  S/p thrombectomy w/ vascular surgery  Cont heparin gtt  Supplemental O2  Follow up vascular surgery recs.

## 2022-12-13 NOTE — Assessment & Plan Note (Signed)
2D echo August 2023 with EF 55 to 60% Large body habitus in setting of morbid obesity Does appear to be fairly euvolemic at present No cardiomegaly or pulmonary congestion on imaging Will monitor for now Continue home Lasix Follow-up

## 2022-12-13 NOTE — Progress Notes (Signed)
Messaged hospitalist, Dr. Alvester Morin, regarding elevated BP at 1711, MD states he will work on orders/meds. 8 messaged MD regarding intermittent low o2 sats. Awaiting nebulizer to be verified by pharmacy.  108 this RN messaged pharmacy requesting all meds to be verified as MD would like lasix given.  Post nebulizer, pt started coughing up scant amount of blood frequently with cough. Dr. Wyn Quaker made aware, cough medicine ordered.   PT stable at this time, will continue to monitor and medicate once medications verified.

## 2022-12-13 NOTE — Assessment & Plan Note (Addendum)
New O2 requirement up to 6 L nasal cannula in the setting of significant clot burden with baseline recurrent PE as well as morbid obesity Recurrent issue  Status post thrombectomy by vascular surgery IV heparin drip Continue supplemental oxygen Follow respiratory status closely

## 2022-12-13 NOTE — Assessment & Plan Note (Signed)
Baseline creatinine around 1.5 Creatinine 1.5 today Follow

## 2022-12-14 ENCOUNTER — Encounter: Payer: Self-pay | Admitting: Vascular Surgery

## 2022-12-14 DIAGNOSIS — I2699 Other pulmonary embolism without acute cor pulmonale: Secondary | ICD-10-CM | POA: Diagnosis not present

## 2022-12-14 LAB — COMPREHENSIVE METABOLIC PANEL
ALT: 16 U/L (ref 0–44)
AST: 18 U/L (ref 15–41)
Albumin: 3.4 g/dL — ABNORMAL LOW (ref 3.5–5.0)
Alkaline Phosphatase: 48 U/L (ref 38–126)
Anion gap: 7 (ref 5–15)
BUN: 18 mg/dL (ref 6–20)
CO2: 24 mmol/L (ref 22–32)
Calcium: 8.1 mg/dL — ABNORMAL LOW (ref 8.9–10.3)
Chloride: 106 mmol/L (ref 98–111)
Creatinine, Ser: 1.32 mg/dL — ABNORMAL HIGH (ref 0.61–1.24)
GFR, Estimated: >60 mL/min (ref >60)
Glucose, Bld: 116 mg/dL — ABNORMAL HIGH (ref 70–99)
Potassium: 3.9 mmol/L (ref 3.5–5.1)
Sodium: 137 mmol/L (ref 135–145)
Total Bilirubin: 1.2 mg/dL (ref 0.3–1.2)
Total Protein: 7 g/dL (ref 6.5–8.1)

## 2022-12-14 LAB — GLUCOSE, CAPILLARY
Glucose-Capillary: 113 mg/dL — ABNORMAL HIGH (ref 70–99)
Glucose-Capillary: 102 mg/dL — ABNORMAL HIGH (ref 70–99)
Glucose-Capillary: 91 mg/dL (ref 70–99)
Glucose-Capillary: 118 mg/dL — ABNORMAL HIGH (ref 70–99)
Glucose-Capillary: 98 mg/dL (ref 70–99)

## 2022-12-14 LAB — CBC
HCT: 44.7 % (ref 39.0–52.0)
Hemoglobin: 14.6 g/dL (ref 13.0–17.0)
MCH: 30.5 pg (ref 26.0–34.0)
MCHC: 32.7 g/dL (ref 30.0–36.0)
MCV: 93.3 fL (ref 80.0–100.0)
Platelets: 220 10*3/uL (ref 150–400)
RBC: 4.79 MIL/uL (ref 4.22–5.81)
RDW: 14.1 % (ref 11.5–15.5)
WBC: 4.1 10*3/uL (ref 4.0–10.5)
nRBC: 0 % (ref 0.0–0.2)

## 2022-12-14 LAB — PROTIME-INR
INR: 1.2 (ref 0.8–1.2)
Prothrombin Time: 15.3 seconds — ABNORMAL HIGH (ref 11.4–15.2)

## 2022-12-14 LAB — HEPARIN LEVEL (UNFRACTIONATED): Heparin Unfractionated: >1.1 IU/mL — ABNORMAL HIGH (ref 0.30–0.70)

## 2022-12-14 LAB — APTT
aPTT: 81 seconds — ABNORMAL HIGH (ref 24–36)
aPTT: 70 seconds — ABNORMAL HIGH (ref 24–36)

## 2022-12-14 MED ORDER — WARFARIN SODIUM 10 MG PO TABS
10.0000 mg | ORAL_TABLET | Freq: Once | ORAL | Status: AC
  Administered 2022-12-14: 10 mg via ORAL
  Filled 2022-12-14: qty 1

## 2022-12-14 MED ORDER — WARFARIN - PHARMACIST DOSING INPATIENT: Freq: Every day | Status: DC

## 2022-12-14 MED ORDER — WARFARIN SODIUM 10 MG PO TABS: 10.0000 mg | ORAL_TABLET | Freq: Once | ORAL | Status: DC

## 2022-12-14 NOTE — Progress Notes (Addendum)
Nutrition Brief Note  RD consulted for diabetes education.   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   Pt admitted with PE.   Noted pt has been refusing insulin doses.   Spoke with pt at bedside, who was pleasant and in good spirits today. He reports he has had pre-DM for years and his HGb A1c has "creeped" in the 6.5-6.6 range in the past 6 months. DM is managed by PCP. Pt shares he has not been checking CBGS at home and was recently prescribed Mounjaro by his PCP to assist with weight loss and DM. Pt shares he just started [facility] this week and plan to titrate dose up slowly to goal rate. He is hopeful that this will help assist with DM and weight loss. Pt shares that he has not yet experienced any side effects from his medications. Pt is very knowledgeable about his treatment plan and medication; reviewed potential side effects of drug, including indigestion, abdominal pain, nausea, vomiting, diarrhea, decreased appetite, constipation, pancreatitis, allergic reaction, hypoglycemia (when used with other drugs that cause hypoglycemia such as insulin and sulfonylureas), kidney failure, gallbladder problems, and changes in vision.   Pt's main concern is that he has been prescribed insulin in the hospital when this is not his home medication. RD educated pt on management of DM in the hospital and how this differs  from outpatient management. Discussed how acute illness can cause stress response, which can lead to hyperglycemia- which necessitates frequent CBG checks and insulin use in hospital to help prevent hyperglycemic events as insulin is more predictable than other DM medications. Assured pt that he will not discharge home on insulin. Encouraged continued follow-up with PCP.   Pt with no further questions and expressed appreciation for visit.   Case discussed with RN, MD, and DM coordinator and reviewed findings with them.   Lab Results  Component Value Date   HGBA1C 6.6 (H) 10/15/2022   PTA DM medications are 5 mg mounjaro weekly.   Labs reviewed: CBGS: 102 (inpatient orders for glycemic control are 0-15 units insulin aspart TID with meals, 0-5 units insulin aspart daily at bedtime, 3 units insulin aspart TID with meals).     Body mass index is 57.15 kg/m. Patient meets criteria for obesity, class III based on current BMI. Obesity is a complex, chronic medical condition that is optimally managed by a multidisciplinary care team. Weight loss is not an ideal goal for an acute inpatient hospitalization. However, if further work-up for obesity is warranted, consider outpatient referral to [facility].    Current diet order is heart healthy/ carb modified, patient is consuming approximately 100% of meals at this time. Labs and medications reviewed.   No nutrition interventions warranted at this time. If nutrition issues arise, please consult RD.   Levada Schilling, RD,  LDN, CDCES Registered Dietitian II Certified Diabetes Care and Education Specialist Please refer to [facility] for RD and/or RD on-call/weekend/after hours pager   RD consulted for nutrition education regarding diabetes.

## 2022-12-14 NOTE — Consult Note (Signed)
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  Allergies  Allergen Reactions   Metrizamide Other (See Comments)    Kidney failure requiring dialysis Kidney failure requiring dialysis    Clindamycin/Lincomycin Rash   Lincomycin Rash   Sulfa Antibiotics Rash    Patient Measurements: Weight: (!) 165.5 kg (364 lb 13.8 oz) Heparin Dosing Weight: 110.6 kg  Vital Signs: Temp: 98.2 F (36.8 C) (04/16 0902) Temp Source: Oral (04/16 0902) BP: 142/87 (04/16 0902) Pulse Rate: 80 (04/16 0902)  Labs: Recent Labs    12/13/22 0808 12/13/22 1130 12/13/22 1956 12/13/22 2303 12/14/22 0358 12/14/22 0959  HGB 16.0  --   --   --  14.6  --   HCT 49.6  --   --   --  44.7  --   PLT 245  --   --   --  220  --   APTT  --   --  60*  --  81* 70*  LABPROT  --   --  17.0*  --   --   --   INR  --   --  1.4*  --   --   --   HEPARINUNFRC  --   --  >1.10*  --  >1.10*  --   CREATININE  --  1.48*  --  1.40* 1.32*  --   TROPONINIHS 33*  --  240*  --   --   --      Estimated Creatinine Clearance: 96.9 mL/min (A) (by C-G formula based on SCr of 1.32 mg/dL (H)).   Medical History: Past Medical History:  Diagnosis Date   Arrhythmia    atrial fibrillation   CHF (congestive heart failure)    Diabetes mellitus without complication    per pt-pre diabetic-Dr Dario Guardian stated said pt   DVT (deep venous thrombosis)    Hepatic steatosis    Hypertension    Morbid obesity with BMI of 50.0-59.9, adult    Peripheral vascular disease    Presence of IVC filter    Pulmonary embolus    Ventricular trigeminy    Weakness of both lower limbs     Medications:  Patient takes Xarelto 20 mg po daily at home, reports last dose 12/12/22 @ 2100.  Pt told pharmacist he did vomit last night after taking dose.  Assessment: 52 yo male presented to ED with shortness of breath.  Patient currently on Xarelto at home for history of PE.  Chest CT was positive for acute on  chronic PE.  Pharmacy consulted to initiate heparin infusion.  4/15 1956 aPTT 60 HL > 1.1 4/16 0358 aPTT 81, therapeutic x 1 4/16 0959 aPTT 70, therapeutic x 2  Goal of Therapy:  Heparin level 0.3-0.7 units/ml when heparin level and aPTT correlate.  aPTT 66-102 seconds Monitor platelets by anticoagulation protocol: Yes   Plan:  Therapeutic x 2 Continue heparin infusion at 2000 units/hr.  Recheck aPTT and HL with AM labs. Will continue to dose with aPTT levels until aPTT and heparin level correlate, then switch to heparin level monitoring.   Bettey Costa, PharmD Clinical Pharmacist 12/14/2022 10:43 AM

## 2022-12-14 NOTE — Consult Note (Signed)
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  Allergies  Allergen Reactions   Metrizamide Other (See Comments)    Kidney failure requiring dialysis Kidney failure requiring dialysis    Clindamycin/Lincomycin Rash   Lincomycin Rash   Sulfa Antibiotics Rash    Patient Measurements: Weight: (!) 165.5 kg (364 lb 13.8 oz) Heparin Dosing Weight: 110.6 kg  Vital Signs: Temp: 98.4 F (36.9 C) (04/16 0411) Temp Source: Oral (04/16 0411) BP: 110/69 (04/16 0411) Pulse Rate: 81 (04/16 0411)  Labs: Recent Labs    12/13/22 0808 12/13/22 1130 12/13/22 1956 12/13/22 2303 12/14/22 0358  HGB 16.0  --   --   --  14.6  HCT 49.6  --   --   --  44.7  PLT 245  --   --   --  220  APTT  --   --  60*  --  81*  LABPROT  --   --  17.0*  --   --   INR  --   --  1.4*  --   --   HEPARINUNFRC  --   --  >1.10*  --  >1.10*  CREATININE  --  1.48*  --  1.40* 1.32*  TROPONINIHS 33*  --  240*  --   --      Estimated Creatinine Clearance: 96.9 mL/min (A) (by C-G formula based on SCr of 1.32 mg/dL (H)).   Medical History: Past Medical History:  Diagnosis Date   Arrhythmia    atrial fibrillation   CHF (congestive heart failure)    Diabetes mellitus without complication    per pt-pre diabetic-Dr Dario Guardian stated said pt   DVT (deep venous thrombosis)    Hepatic steatosis    Hypertension    Morbid obesity with BMI of 50.0-59.9, adult    Peripheral vascular disease    Presence of IVC filter    Pulmonary embolus    Ventricular trigeminy    Weakness of both lower limbs     Medications:  Patient takes Xarelto 20 mg po daily at home, reports last dose 12/12/22 @ 2100.  Pt told pharmacist he did vomit last night after taking dose.  Assessment: 52 yo male presented to ED with shortness of breath.  Patient currently on Xarelto at home for history of PE.  Chest CT was positive for acute on chronic PE.  Pharmacy consulted to initiate heparin  infusion.  4/15 1956 aPTT 60 HL > 1.1  Goal of Therapy:  Heparin level 0.3-0.7 units/ml when heparin level and aPTT correlate.  aPTT 66-102 seconds Monitor platelets by anticoagulation protocol: Yes   Plan:  aPTT is therapeutic, HL not correlating. Continue heparin infusion at 2000 units/hr. Recheck aPTT in 6 hours to confirm, then daily. Recheck HL daily until aPTT and heparin level correlate, then switch to heparin level monitoring.   Otelia Sergeant, PharmD, [facility] 12/14/2022 4:47 AM

## 2022-12-14 NOTE — Progress Notes (Signed)
PROGRESS NOTE    Ryan [name]  JYN:[id number] DOB: [birth year] DOA: 12/13/2022 PCP: [name], NP   Brief Narrative: This 52 y.o. 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  Patient reports increased work of breathing and cough over the past 2 to 3 weeks.  Patient reports he has been relatively compliant with Xarelto.  He did miss the dose yesterday in the setting of vomiting. He was hypoxic with SpO2 of 80% on room air in the ED, requiring up to 6 L to keep saturation above 92%.  CTA chest showed acute on chronic PE with right main pulmonary thrombus superimposed on bulky chronic clot which is seen adherent to the vessel wall.  Patient was admitted for further evaluation.  Vascular surgery was consulted Patient underwent mechanical thrombectomy,  tolerated well.  Patient continued on heparin with the plan to bridge with Coumadin.  Assessment & Plan:   Active Problems:   Pulmonary emboli   Acute respiratory failure with hypoxia   Hypertension   Type 2 diabetes mellitus with diabetic chronic kidney disease   CKD (chronic kidney disease), stage IIIa   Chronic diastolic CHF (congestive heart failure)   Atrial flutter with rapid ventricular response  Acute hypoxic respiratory failure: Recurrent pulmonary embolism: Patient presented with cough and increased work of breathing. He was hypoxic requiring up to 6 L of supplemental oxygen to keep sat above 94%. CT chest showed acute on chronic pulmonary embolism. Continue supplemental oxygen and wean as tolerated, Continued on IV heparin. Vascular surgery consulted.  Patient underwent mechanical thrombectomy,  tolerated well. Plan is to resume heparin and bridge with Coumadin.   CKD stage IIIa: Baseline serum creatinine remains around  1.5. Creatinine is at baseline. Avoid nephrotoxic medications   Type 2 diabetes with hyperglycemia: Continue regular insulin sliding scale.   Hold p.o. diabetic medications. Obtain hemoglobin A1c.   Hypertension: BP stable. Continue metoprolol 25 mg daily. Continue Entresto  Chronic diastolic CHF: 2D echo August 2023 with EF 55 to 60% Large body habitus in setting of morbid obesity Does appear to be fairly euvolemic at present. No cardiomegaly or pulmonary congestion on imaging Continue Lasix 40 mg daily, Entresto 2 times daily  Atrial flutter with rapid ventricular response S/p ablation  Heart rate is controlled, EKG normal sinus rhythm. Continue metoprolol 25 mg daily.     DVT prophylaxis: Heparin  > Coumadin Code Status: Full code Family Communication:No family at bed side. Disposition Plan:   Status is: Inpatient Remains inpatient appropriate because: Recurrent PE while being on Xarelto.  Vascular surgery consulted, underwent mechanical thrombectomy.  Continue on heparin with the plan to bridge with Coumadin   Consultants:  Vascular surgery  Procedures: Mechanical thrombectomy Antimicrobials:  Anti-infectives (From admission, onward)    Start     Dose/Rate Route Frequency Ordered Stop   12/13/22 1445  ceFAZolin (ANCEF) IVPB 2g/100 mL premix        2 g 200 mL/hr over 30 Minutes Intravenous 30 min pre-op 12/13/22 1439 12/13/22 1619      Subjective: Patient was seen and examined at bedside.  Overnight events noted.  Patient report doing much better.  He is weaned down to 3 L of supplemental oxygen, denies any chest pain or shortness of breath  Objective: Vitals:   12/13/22 2328 12/14/22 0411 12/14/22  0902 12/14/22 1228  BP: (!) 140/96 110/69 (!) 142/87 (!) 159/102  Pulse: 89 81 80 80  Resp: (!) 23 (!) Temp: 98.4 F (36.9 C) 98.4 F (36.9 C) 98.2 F (36.8 C) 98.2 F (36.8 C)  TempSrc: Oral Oral Oral   SpO2: 93% 91% 97% 97%  Weight:         Intake/Output Summary (Last 24 hours) at 12/14/2022 1428 Last data filed at 12/14/2022 1048 Gross per 24 hour  Intake 1205.05 ml  Output 1125 ml  Net 80.05 ml   Filed Weights   12/13/22 1340 12/13/22 2004  Weight: (!) 176.2 kg (!) 165.5 kg    Examination:  General exam: Appears comfortable, not in any acute distress. Respiratory system: Clear to auscultation. Respiratory effort normal.  RR 16 Cardiovascular system: S1 & S2 heard, regular rate and rhythm, no murmur. Gastrointestinal system: Abdomen is soft, non tender, non distended, BS+ Central nervous system: Alert and oriented x 3. No focal neurological deficits. Extremities: No edema, no cyanosis, no clubbing. Skin: No rashes, lesions or ulcers Psychiatry: Judgement and insight appear normal. Mood & affect appropriate.     Data Reviewed: I have personally reviewed following labs and imaging studies  CBC: Recent Labs  Lab 12/13/22 0808 12/14/22 0358  WBC 3.5* 4.1  HGB 16.0 14.6  HCT 49.6 44.7  MCV 95.8 93.3  PLT 245 220   Basic Metabolic Panel: Recent Labs  Lab 12/13/22 1130 12/13/22 2303 12/14/22 0358  NA 139 138 137  K 4.4 3.6 3.9  CL 105 105 106  CO2 GLUCOSE 97 100* 116*  BUN 21* 20 18  CREATININE 1.48* 1.40* 1.32*  CALCIUM 8.4* 8.0* 8.1*  MG  --  1.9  --    GFR: Estimated Creatinine Clearance: 96.9 mL/min (A) (by C-G formula based on SCr of 1.32 mg/dL (H)). Liver Function Tests: Recent Labs  Lab 12/14/22 0358  AST 18  ALT 16  ALKPHOS 48  BILITOT 1.2  PROT 7.0  ALBUMIN 3.4*   No results for input(s): "LIPASE", "AMYLASE" in the last 168 hours. No results for input(s): "AMMONIA" in the last 168 hours. Coagulation Profile: Recent Labs  Lab 12/13/22 1956 12/14/22 1310  INR 1.4* 1.2   Cardiac Enzymes: No results for input(s): "CKTOTAL", "CKMB", "CKMBINDEX", "TROPONINI" in the last 168 hours. BNP (last 3 results) No results for input(s): "PROBNP" in the last 8760  hours. HbA1C: No results for input(s): "HGBA1C" in the last 72 hours. CBG: Recent Labs  Lab 12/13/22 1956 12/13/22 2037 12/14/22 0905 12/14/22 1225  GLUCAP 82 90 113* 102*   Lipid Profile: No results for input(s): "CHOL", "HDL", "LDLCALC", "TRIG", "CHOLHDL", "LDLDIRECT" in the last 72 hours. Thyroid Function Tests: No results for input(s): "TSH", "T4TOTAL", "FREET4", "T3FREE", "THYROIDAB" in the last 72 hours. Anemia Panel: No results for input(s): "VITAMINB12", "FOLATE", "FERRITIN", "TIBC", "IRON", "RETICCTPCT" in the last 72 hours. Sepsis Labs: No results for input(s): "PROCALCITON", "LATICACIDVEN" in the last 168 hours.  Recent Results (from the past 240 hour(s))  Resp panel by RT-PCR (RSV, Flu A&B, Covid) Anterior Nasal Swab     Status: None   Collection Time: 12/13/22  8:22 AM   Specimen: Anterior Nasal Swab  Result Value Ref Range Status   SARS Coronavirus 2 by RT PCR NEGATIVE NEGATIVE Final    Comment: (NOTE) SARS-CoV-2 target nucleic acids are NOT DETECTED.  The SARS-CoV-2 RNA is generally detectable in upper respiratory specimens during the acute phase  of infection. The lowest concentration of SARS-CoV-2 viral copies this assay can detect is 138 copies/mL. A negative result does not preclude SARS-Cov-2 infection and should not be used as the sole basis for treatment or other patient management decisions. A negative result may occur with  improper specimen collection/handling, submission of specimen other than nasopharyngeal swab, presence of viral mutation(s) within the areas targeted by this assay, and inadequate number of viral copies(<138 copies/mL). A negative result must be combined with clinical observations, patient history, and epidemiological information. The expected result is Negative.  Fact Sheet for Patients:  BloggerCourse.com  Fact Sheet for Healthcare Providers:  SeriousBroker.it  This test is  no t yet approved or cleared by the [place] FDA and  has been authorized for detection and/or diagnosis of SARS-CoV-2 by FDA under an Emergency Use Authorization (EUA). This EUA will remain  in effect (meaning this test can be used) for the duration of the COVID-19 declaration under Section 564(b)(1) of the Act, 21 U.S.C.section 360bbb-3(b)(1), unless the authorization is terminated  or revoked sooner.       Influenza A by PCR NEGATIVE NEGATIVE Final   Influenza B by PCR NEGATIVE NEGATIVE Final    Comment: (NOTE) The Xpert Xpress SARS-CoV-2/FLU/RSV plus assay is intended as an aid in the diagnosis of influenza from Nasopharyngeal swab specimens and should not be used as a sole basis for treatment. Nasal washings and aspirates are unacceptable for Xpert Xpress SARS-CoV-2/FLU/RSV testing.  Fact Sheet for Patients: BloggerCourse.com  Fact Sheet for Healthcare Providers: SeriousBroker.it  This test is not yet approved or cleared by the [place] FDA and has been authorized for detection and/or diagnosis of SARS-CoV-2 by FDA under an Emergency Use Authorization (EUA). This EUA will remain in effect (meaning this test can be used) for the duration of the COVID-19 declaration under Section 564(b)(1) of the Act, 21 U.S.C. section 360bbb-3(b)(1), unless the authorization is terminated or revoked.     Resp Syncytial Virus by PCR NEGATIVE NEGATIVE Final    Comment: (NOTE) Fact Sheet for Patients: BloggerCourse.com  Fact Sheet for Healthcare Providers: SeriousBroker.it  This test is not yet approved or cleared by the [place] FDA and has been authorized for detection and/or diagnosis of SARS-CoV-2 by FDA under an Emergency Use Authorization (EUA). This EUA will remain in effect (meaning this test can be used) for the duration of the COVID-19 declaration under Section  564(b)(1) of the Act, 21 U.S.C. section 360bbb-3(b)(1), unless the authorization is terminated or revoked.  Performed at [facility], [address] [place], [place] [address]    Radiology Studies: PERIPHERAL VASCULAR CATHETERIZATION  Result Date: 12/13/2022 See surgical note for result.  CT Angio Chest PE W and/or Wo Contrast  Result Date: 12/13/2022 CLINICAL DATA:  52 year old male with shortness of breath and dull chest pain. Persistent cough. History of previous pulmonary embolus. EXAM: CT ANGIOGRAPHY CHEST WITH CONTRAST TECHNIQUE: Multidetector CT imaging of the chest was performed using the standard protocol during bolus administration of intravenous contrast. Multiplanar CT image reconstructions and MIPs were obtained to evaluate the vascular anatomy. RADIATION DOSE REDUCTION: This exam was performed according to the departmental dose-optimization program which includes automated exposure control, adjustment of the mA and/or kV according to patient size and/or use of iterative reconstruction technique. CONTRAST:  75mL OMNIPAQUE IOHEXOL 350 MG/ML SOLN COMPARISON:  CTA chest 07/30/2022 and earlier. FINDINGS: Cardiovascular: Adequate contrast bolus timing in the pulmonary arterial tree. Bulky but chronic, previous chronic adherent and mostly nonocclusive  thrombus in the right main pulmonary artery, but progressed and now near occlusive clot there on series 7, image 88. New clot from that segment tracking into right upper lobe branches. No saddle embolus. Respiratory motion limits detail of the right lower lobe pulmonary arteries, but chronic mural thrombus there appears fairly stable since December. Left main pulmonary artery remains patent. Left lobar and segmental arteries appear to be patent. Stable heart size. No pericardial effusion. Early contrast in the aorta which appears stable and negative. Chronic anterior chest wall venous collaterals appear stable since December and are  not enhancing in the early contrast phase. Mediastinum/Nodes: Negative for mediastinal mass or lymphadenopathy. Lungs/Pleura: Major airways remain patent. Respiratory motion more so in the lower lobes. Stable lung volumes. No pleural effusion. No pulmonary infarct at this time. Stable ventilation compared to December. Upper Abdomen: Chronic suprarenal IVC filter appears stable since December. Stable and negative visible essentially noncontrast liver, gallbladder, spleen, pancreas, adrenal glands, kidneys, and bowel. Ventral abdominal wall venous collaterals continue from the chest and appears stable. Musculoskeletal: No acute osseous abnormality identified. Mid to lower thoracic flowing endplate osteophytes resulting in some levels of chronic interbody ankylosis. Review of the MIP images confirms the above findings. IMPRESSION: 1. Positive for Acute On Chronic PE: Right main pulmonary thrombus superimposed on bulky chronic clot which was seen to be adherent to the vessel wall on December CTA. New near-occlusion of the right main pulmonary artery, and evidence of acute thrombus extension into upper lobe branches. 2. No pulmonary infarct or pleural effusion at this time. Stable lung ventilation compared to December. 3. No other acute finding in the chest. Chronic suprarenal IVC filter appears stable since December. Critical Value/emergent results were called by telephone at the time of interpretation on 12/13/2022 at 1:07 pm to Dr. Phineas Semen , who verbally acknowledged these results. Electronically Signed   By: Odessa Fleming M.D.   On: 12/13/2022 13:10   DG Chest Port 1 View  Result Date: 12/13/2022 CLINICAL DATA:  Shortness of breath EXAM: PORTABLE CHEST 1 VIEW COMPARISON:  Chest x-ray July 30, 2022 FINDINGS: The cardiomediastinal silhouette is unchanged in contour. No focal pulmonary opacity. No pleural effusion or pneumothorax. The visualized upper abdomen is unremarkable. No acute osseous abnormality.  IMPRESSION: No acute cardiopulmonary abnormality. Electronically Signed   By: Jacob Moores M.D.   On: 12/13/2022 08:11    Scheduled Meds:  furosemide  40 mg Oral QODAY   insulin aspart  0-15 Units Subcutaneous TID WC   insulin aspart  0-5 Units Subcutaneous QHS   insulin aspart  3 Units Subcutaneous TID WC   magnesium oxide  400 mg Oral Daily   metoprolol succinate  25 mg Oral Daily   sacubitril-valsartan  1 tablet Oral BID   Continuous Infusions:  heparin 2,000 Units/hr (12/14/22 0915)     LOS: 1 day    Time spent: 50 mins   Willeen Niece, MD [facility]   If 7PM-7AM, please contact night-coverage

## 2022-12-14 NOTE — Consult Note (Signed)
ANTICOAGULATION CONSULT NOTE   Pharmacy Consult for: Warfarin w/ heparin drip bridge Indication: Acute on chronic PE  Allergies  Allergen Reactions   Metrizamide Other (See Comments)    Kidney failure requiring dialysis Kidney failure requiring dialysis    Clindamycin/Lincomycin Rash   Lincomycin Rash   Sulfa Antibiotics Rash    Patient Measurements: Weight: (!) 165.5 kg (364 lb 13.8 oz) Heparin Dosing Weight: 110.6 kg  Vital Signs: Temp: 98.2 F (36.8 C) (04/16 1228) Temp Source: Oral (04/16 0902) BP: 159/102 (04/16 1228) Pulse Rate: 80 (04/16 1228)  Labs: Recent Labs    12/13/22 0808 12/13/22 1130 12/13/22 1956 12/13/22 2303 12/14/22 0358 12/14/22 0959 12/14/22 1310  HGB 16.0  --   --   --  14.6  --   --   HCT 49.6  --   --   --  44.7  --   --   PLT 245  --   --   --  220  --   --   APTT  --   --  60*  --  81* 70*  --   LABPROT  --   --  17.0*  --   --   --  15.3*  INR  --   --  1.4*  --   --   --  1.2  HEPARINUNFRC  --   --  >1.10*  --  >1.10*  --   --   CREATININE  --  1.48*  --  1.40* 1.32*  --   --   TROPONINIHS 33*  --  240*  --   --   --   --      Estimated Creatinine Clearance: 96.9 mL/min (A) (by C-G formula based on SCr of 1.32 mg/dL (H)).   Medical History: Past Medical History:  Diagnosis Date   Arrhythmia    atrial fibrillation   CHF (congestive heart failure)    Diabetes mellitus without complication    per pt-pre diabetic-Dr Dario Guardian stated said pt   DVT (deep venous thrombosis)    Hepatic steatosis    Hypertension    Morbid obesity with BMI of 50.0-59.9, adult    Peripheral vascular disease    Presence of IVC filter    Pulmonary embolus    Ventricular trigeminy    Weakness of both lower limbs     Medications:  Patient takes Xarelto 20 mg po daily at home, reports last dose 12/12/22 @ 2100.  Pt told pharmacist he did vomit last night after taking dose.  Assessment: 52 yo male presented to ED with shortness of breath.  Patient  currently on Xarelto at home for history of PE.  Chest CT was positive for acute on chronic PE.  Pharmacy consulted to initiate heparin infusion.  4/15 1956 aPTT 60 HL > 1.1 4/16 0358 aPTT 81, therapeutic x 1 4/16 0959 aPTT 70, therapeutic x 2  Date: INR: Dose: 4/16 1.2   Goal of Therapy:  Heparin level 0.3-0.7 units/ml when heparin level and aPTT correlate.  aPTT 66-102 seconds Monitor platelets by anticoagulation protocol: Yes   Plan:  Heparin infusion: Therapeutic x 2 Continue heparin infusion at 2000 units/hr.  Recheck aPTT and HL with AM labs. Will continue to dose with aPTT levels until aPTT and heparin level correlate, then switch to heparin level monitoring.  CBC daily  Warfarin: Will order Warfarin  x 1(dose based on [place] Dosing Nomogram for Warfarin Naive Patients) Recheck INR with AM labs  [facility]  Ardis Rowan, PharmD Clinical Pharmacist 12/14/2022 3:18 PM

## 2022-12-14 NOTE — Progress Notes (Signed)
Progress Note    12/14/2022 11:07 AM 1 Day Post-Op  Subjective:  Ryan [name] is a 52 y.o. 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 CTA of the chest with acute on chronic PE with right main pulmonary thrombus superimposed on bulky chronic clot which is seen to be adherent to the vessel wall from December CT angio of the chest. New near occlusion of the right main pulmonary artery and evidence of acute thrombus extension into the upper lobe branches.   Patient is now postop day 1 from a pulmonary thrombectomy.  He is resting comfortably in bed.  Remains on 6 L nasal cannula oxygen with O2 saturation between 90 and 92%.  Patient denies any chest pain this morning.  Patient states that the pain he was having yesterday has now resolved.  Patient still remains short of breath but he endorses his breathing is much better today.   Vitals:   12/14/22 0411 12/14/22 0902  BP: 110/69 (!) 142/87  Pulse: 81 80  Resp: (!) 23 14  Temp: 98.4 F (36.9 C) 98.2 F (36.8 C)  SpO2: 91% 97%   Physical Exam: Cardiac:  irregular, without  Murmurs, rubs or gallops; without carotid bruits  Lungs:  labored breathing, without Rales, rhonchi, wheezing  Incisions:  Right Jugular with dressing clean dry and intact.  Extremities:  Unable to palpate bilateral lower extremity pulses due to venous stasis and +4 edema.Patient is also morbidly obese impeding the ability to feel pulses  Abdomen:  Positive bowel sounds, soft, NT/ND, no masses  Neurologic:  A&O X 3; No focal weakness or paresthesias are detected; speech is fluent/normal   CBC    Component Value Date/Time   WBC 4.1 12/14/2022 0358   RBC 4.79 12/14/2022 0358   HGB 14.6 12/14/2022 0358   HGB 15.9 10/15/2022 1005   HCT 44.7 12/14/2022 0358   HCT 48.4  10/15/2022 1005   PLT 220 12/14/2022 0358   PLT 280 10/15/2022 1005   MCV 93.3 12/14/2022 0358   MCV 93 10/15/2022 1005   MCV 90 12/28/2014 0444   MCH 30.5 12/14/2022 0358   MCHC 32.7 12/14/2022 0358   RDW 14.1 12/14/2022 0358   RDW 13.9 10/15/2022 1005   RDW 14.0 12/28/2014 0444   LYMPHSABS 1.4 10/15/2022 1005   LYMPHSABS 1.3 12/28/2014 0444   MONOABS 0.6 07/04/2022 0631   MONOABS 2.1 (H) 12/28/2014 0444   EOSABS 0.2 10/15/2022 1005   EOSABS 0.1 12/28/2014 0444   BASOSABS 0.0 10/15/2022 1005   BASOSABS 0.1 12/28/2014 0444    BMET    Component Value Date/Time   NA 137 12/14/2022 0358   NA 141 10/15/2022 1005   NA 130 (L) 12/28/2014 1602   K 3.9 12/14/2022 0358   K 3.8 12/28/2014 1602   CL 106 12/14/2022 0358   CL 94 (L) 12/28/2014 1602   CO2 24 12/14/2022 0358   CO2 24 12/28/2014 1602   GLUCOSE 116 (H) 12/14/2022 0358   GLUCOSE 121 (H) 12/28/2014 1602   BUN 18 12/14/2022 0358   BUN 12 10/15/2022 1005   BUN 57 (H) 12/28/2014 1602   CREATININE 1.32 (H) 12/14/2022 0358   CREATININE 5.55 (H) 12/28/2014 1602   CALCIUM 8.1 (L) 12/14/2022 0358   CALCIUM 8.1 (L) 12/28/2014 1602   GFRNONAA >60 12/14/2022 1610  GFRNONAA 11 (L) 12/28/2014 1602   GFRAA 55 (L) 03/07/2020 2009   GFRAA 13 (L) 12/28/2014 1602    INR    Component Value Date/Time   INR 1.4 (H) 12/13/2022 1956   INR 1.2 12/23/2014 2219     Intake/Output Summary (Last 24 hours) at 12/14/2022 1107 Last data filed at 12/14/2022 1048 Gross per 24 hour  Intake 1205.05 ml  Output 1125 ml  Net 80.05 ml     Assessment/Plan:  52 y.o. male is s/p Mechanical pulmonary thrombectomy 1 Day Post-Op   PLAN: Patient is recovering as expected.  Remains on 6 L nasal cannula oxygen with O2 saturation 90 to 92%.  Patient noted to have acute on chronic pulmonary embolisms.  Patient may require oxygen at home after this mechanical thrombectomy.  Patient also states that he would like to start Coumadin and stop taking his  Xarelto.  Patient states when he was younger he was on Coumadin and did not have the problems he is having with clotting today.  Vascular surgery is okay with the patient being bridged from heparin back to Coumadin while recovering from his pulmonary embolism.  Consulted pharmacy for Coumadin teaching is ordered.  Per vascular surgery okay to discharge once patient is properly anticoagulated.  DVT prophylaxis:  Heparin Infusion    Marcie Bal [facility] 12/14/2022 11:07 AM

## 2022-12-14 NOTE — TOC Initial Note (Signed)
Transition of Care [facility]) - Initial/Assessment Note    Patient Details  Name: Ryan [name] MRN: [id number] Date of Birth: 11-Jul-1971  Transition of Care [facility]) CM/SW Contact:    Truddie Hidden, RN Phone Number: 12/14/2022, 3:18 PM  Clinical Narrative:                  Transition of Care [facility]) Screening Note   Patient Details  Name: Ryan [name] Date of Birth: Aug 29, 1971   Transition of Care [facility]) CM/SW Contact:    Truddie Hidden, RN Phone Number: 12/14/2022, 3:19 PM    Transition of Care Department [facility] has reviewed patient and no TOC needs have been identified at this time. We will continue to monitor patient advancement through interdisciplinary progression rounds. If new patient transition needs arise, please place a TOC consult.          Patient Goals and CMS Choice            Expected Discharge Plan and Services                                              Prior Living Arrangements/Services                       Activities of Daily Living      Permission Sought/Granted                  Emotional Assessment              Admission diagnosis:  Shortness of breath [R06.02] Pulmonary emboli [I26.99] Other acute pulmonary embolism, unspecified whether acute cor pulmonale present [I26.99] Patient Active Problem List   Diagnosis Date Noted   Pulmonary emboli 12/13/2022   Acute respiratory failure with hypoxia 12/13/2022   Cellulitis of right lower leg 04/12/2022   Sepsis 04/06/2022   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 02/09/2022   Lymphadenopathy 02/09/2022   Physical deconditioning 01/27/2022   At risk for sleep apnea 01/27/2022   VTE (venous thromboembolism) 01/26/2022   Sepsis with acute hypoxic respiratory failure    Chronic diastolic CHF (congestive heart failure) 01/04/2022   Severe sepsis 01/01/2022   Clotting disorder 10/19/2021   Angina at rest 07/29/2021   Hyperlipidemia associated  with type 2 diabetes mellitus 07/29/2021   Cellulitis of right foot 06/03/2021   Atrial fibrillation with rapid ventricular response 10/14/2020   Atrial flutter with rapid ventricular response 10/01/2020   Knee pain 03/11/2020   Weakness of both lower limbs 11/17/2017   Type 2 diabetes mellitus with diabetic chronic kidney disease 08/03/2017   CKD (chronic kidney disease), stage IIIa 08/03/2017   Hypertension 12/15/2016   Fatigue 12/15/2016   PVC (premature ventricular contraction) 11/24/2016   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   History of pulmonary embolism and DVT 12/28/2014   Hepatic steatosis 05/30/2013   Morbid obesity with BMI of 50.0-59.9, adult 05/30/2013   Proteinuria 05/30/2013   PCP:  [name], NP Pharmacy:   [facility] [phone] [place] Ditto, [place] - [address] AT [facility] [address] [place] [address] Phone: [phone] Fax: [phone]     Social Determinants of Health (SDOH) Social History: SDOH Screenings   Depression (PHQ2-9): Low Risk  (10/15/2022)  Tobacco Use: Low Risk  (12/14/2022)  SDOH Interventions:     Readmission Risk Interventions     No data to display

## 2022-12-15 DIAGNOSIS — I10 Essential (primary) hypertension: Secondary | ICD-10-CM | POA: Diagnosis not present

## 2022-12-15 DIAGNOSIS — E1122 Type 2 diabetes mellitus with diabetic chronic kidney disease: Secondary | ICD-10-CM | POA: Diagnosis not present

## 2022-12-15 DIAGNOSIS — J9601 Acute respiratory failure with hypoxia: Secondary | ICD-10-CM | POA: Diagnosis not present

## 2022-12-15 DIAGNOSIS — N1831 Chronic kidney disease, stage 3a: Secondary | ICD-10-CM

## 2022-12-15 DIAGNOSIS — I2699 Other pulmonary embolism without acute cor pulmonale: Secondary | ICD-10-CM | POA: Diagnosis not present

## 2022-12-15 LAB — BASIC METABOLIC PANEL
Anion gap: 9 (ref 5–15)
BUN: 15 mg/dL (ref 6–20)
CO2: 21 mmol/L — ABNORMAL LOW (ref 22–32)
Calcium: 8.5 mg/dL — ABNORMAL LOW (ref 8.9–10.3)
Chloride: 106 mmol/L (ref 98–111)
Creatinine, Ser: 1.23 mg/dL (ref 0.61–1.24)
GFR, Estimated: >60 mL/min (ref >60)
Glucose, Bld: 101 mg/dL — ABNORMAL HIGH (ref 70–99)
Potassium: 4.2 mmol/L (ref 3.5–5.1)
Sodium: 136 mmol/L (ref 135–145)

## 2022-12-15 LAB — PHOSPHORUS: Phosphorus: 3.5 mg/dL (ref 2.5–4.6)

## 2022-12-15 LAB — CBC
HCT: 44.4 % (ref 39.0–52.0)
Hemoglobin: 14.4 g/dL (ref 13.0–17.0)
MCH: 30.8 pg (ref 26.0–34.0)
MCHC: 32.4 g/dL (ref 30.0–36.0)
MCV: 94.9 fL (ref 80.0–100.0)
Platelets: 207 10*3/uL (ref 150–400)
RBC: 4.68 MIL/uL (ref 4.22–5.81)
RDW: 14.2 % (ref 11.5–15.5)
WBC: 3.6 10*3/uL — ABNORMAL LOW (ref 4.0–10.5)
nRBC: 0 % (ref 0.0–0.2)

## 2022-12-15 LAB — PROTIME-INR
INR: 1.3 — ABNORMAL HIGH (ref 0.8–1.2)
Prothrombin Time: 16 seconds — ABNORMAL HIGH (ref 11.4–15.2)

## 2022-12-15 LAB — GLUCOSE, CAPILLARY
Glucose-Capillary: 100 mg/dL — ABNORMAL HIGH (ref 70–99)
Glucose-Capillary: 108 mg/dL — ABNORMAL HIGH (ref 70–99)
Glucose-Capillary: 88 mg/dL (ref 70–99)
Glucose-Capillary: 105 mg/dL — ABNORMAL HIGH (ref 70–99)

## 2022-12-15 LAB — MAGNESIUM: Magnesium: 2 mg/dL (ref 1.7–2.4)

## 2022-12-15 LAB — APTT: aPTT: 68 seconds — ABNORMAL HIGH (ref 24–36)

## 2022-12-15 LAB — HEPARIN LEVEL (UNFRACTIONATED): Heparin Unfractionated: 0.52 IU/mL (ref 0.30–0.70)

## 2022-12-15 MED ORDER — WARFARIN SODIUM 10 MG PO TABS
10.0000 mg | ORAL_TABLET | Freq: Once | ORAL | Status: AC
  Administered 2022-12-15: 10 mg via ORAL
  Filled 2022-12-15: qty 1

## 2022-12-15 NOTE — Progress Notes (Signed)
[facility]  - [place] [facility]   PATIENT NAME: Ryan [name]    MR#:  [id number]  DATE OF BIRTH:  07-19-1971  SUBJECTIVE:      VITALS:  Blood pressure (!) 128/101, pulse 82, temperature 97.8 F (36.6 C), temperature source Oral, resp. rate 18, weight (!) 165.5 kg, SpO2 91 %.  PHYSICAL EXAMINATION:   GENERAL:  52 y.o.-year-old patient with no acute distress.  LUNGS: Normal breath sounds bilaterally, no wheezing CARDIOVASCULAR: S1, S2 normal. No murmur   ABDOMEN: Soft, nontender, nondistended. Bowel sounds present.  EXTREMITIES: No  edema b/l.    NEUROLOGIC: nonfocal  patient is alert and awake SKIN: No obvious rash, lesion, or ulcer.   LABORATORY PANEL:  CBC Recent Labs  Lab 12/15/22 0455  WBC 3.6*  HGB 14.4  HCT 44.4  PLT 207    Chemistries  Recent Labs  Lab 12/14/22 0358 12/15/22 0455  NA 137 136  K 3.9 4.2  CL 106 106  CO2 24 21*  GLUCOSE 116* 101*  BUN 18 15  CREATININE 1.32* 1.23  CALCIUM 8.1* 8.5*  MG  --  2.0  AST 18  --   ALT 16  --   ALKPHOS 48  --   BILITOT 1.2  --    Assessment and Plan 52 y.o. 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  Patient reports increased work of breathing and cough over the past 2 to 3 weeks.  Patient reports he has been relatively compliant with Xarelt0   CTA chest showed acute on chronic PE with right main pulmonary thrombus superimposed on bulky chronic clot which is seen adherent to the vessel wall.  Patient was admitted for further evaluation.  Vascular surgery was consulted Patient underwent mechanical thrombectomy,  tolerated well.  Patient continued on heparin with the plan to bridge with Coumadin.   Acute hypoxic respiratory failure: Recurrent pulmonary embolism: --Patient presented with cough  and increased work of breathing. --He was hypoxic requiring up to 6 L of supplemental oxygen to keep sat above 94%. --CT chest showed acute on chronic pulmonary embolism. --Continue supplemental oxygen and wean as tolerated, --Vascular surgery consulted.  Patient underwent mechanical thrombectomy,  tolerated well. --Plan is to resume heparin and bridge with Coumadin.   CKD stage IIIa: Baseline serum creatinine remains around 1.5. Creatinine is at baseline. Avoid nephrotoxic medications   Type 2 diabetes with hyperglycemia: --Continue regular insulin sliding scale.   --hemoglobin A1c feb 2024--6.6 --on Mounjaro at home   Hypertension: --BP stable. --Continue metoprolol 25 mg daily. --Continue Entresto   Chronic diastolic CHF: --2D echo August 2023 with EF 55 to 60% --Large body habitus in setting of morbid obesity --Does appear to be fairly euvolemic at present. --No cardiomegaly or pulmonary congestion on imaging --Continue Lasix and Entresto   Atrial flutter with rapid ventricular response S/p ablation  Heart rate is controlled, EKG normal sinus rhythm. Continue metoprolol         DVT prophylaxis: Heparin  > Coumadin Code Status: Full code Family Communication:No family at bed side. Disposition Plan: home once INR therapeutic Level of care: Med-Surg Status is: Inpatient Remains inpatient appropriate because: Once INR is therapeutic    TOTAL TIME TAKING CARE OF THIS PATIENT: 35 minutes.  >50% time spent on counselling and coordination of  care  Note: This dictation was prepared with Dragon dictation along with smaller phrase technology. Any transcriptional errors that result from this process are unintentional.  Enedina Finner M.D    [facility]   CC: Primary care physician; [name], NP

## 2022-12-15 NOTE — Consult Note (Signed)
ANTICOAGULATION CONSULT NOTE   Pharmacy Consult for: Warfarin w/ heparin drip bridge Indication: Acute on chronic PE  Allergies  Allergen Reactions   Metrizamide Other (See Comments)    Kidney failure requiring dialysis Kidney failure requiring dialysis    Clindamycin/Lincomycin Rash   Lincomycin Rash   Sulfa Antibiotics Rash    Patient Measurements: Weight: (!) 165.5 kg (364 lb 13.8 oz) Heparin Dosing Weight: 110.6 kg  Vital Signs: Temp: 98.2 F (36.8 C) (04/17 0334) Temp Source: Oral (04/17 0334) BP: 130/89 (04/17 0334) Pulse Rate: 74 (04/17 0334)  Labs: Recent Labs    12/13/22 0808 12/13/22 1130 12/13/22 1956 12/13/22 2303 12/14/22 0358 12/14/22 0959 12/14/22 1310 12/15/22 0455  HGB 16.0  --   --   --  14.6  --   --  14.4  HCT 49.6  --   --   --  44.7  --   --  44.4  PLT 245  --   --   --  220  --   --  207  APTT  --    < > 60*  --  81* 70*  --  68*  LABPROT  --   --  17.0*  --   --   --  15.3* 16.0*  INR  --   --  1.4*  --   --   --  1.2 1.3*  HEPARINUNFRC  --   --  >1.10*  --  >1.10*  --   --  0.52  CREATININE  --    < >  --  1.40* 1.32*  --   --  1.23  TROPONINIHS 33*  --  240*  --   --   --   --   --    < > = values in this interval not displayed.     Estimated Creatinine Clearance: 104 mL/min (by C-G formula based on SCr of 1.23 mg/dL).   Medical History: Past Medical History:  Diagnosis Date   Arrhythmia    atrial fibrillation   CHF (congestive heart failure)    Diabetes mellitus without complication    per pt-pre diabetic-Dr Dario Guardian stated said pt   DVT (deep venous thrombosis)    Hepatic steatosis    Hypertension    Morbid obesity with BMI of 50.0-59.9, adult    Peripheral vascular disease    Presence of IVC filter    Pulmonary embolus    Ventricular trigeminy    Weakness of both lower limbs     Medications:  Patient takes Xarelto 20 mg po daily at home, reports last dose 12/12/22 @ 2100.  Pt told pharmacist he did vomit last night  after taking dose.  Assessment: 52 yo male presented to ED with shortness of breath.  Patient currently on Xarelto at home for history of PE.  Chest CT was positive for acute on chronic PE.  Pharmacy consulted to initiate heparin infusion.  4/15 1956 aPTT 60 HL > 1.1 4/16 0358 aPTT 81, therapeutic x 1 4/16 0959 aPTT 70, therapeutic x 2 4/17 0455 aPTT 68, therapeutic x 3, HL 0.52  Date: INR: Dose: 4/16 1.2 4/17 1.3   Goal of Therapy:  Heparin level 0.3-0.7 units/ml when heparin level and aPTT correlate.  aPTT 66-102 seconds Monitor platelets by anticoagulation protocol: Yes   Plan:  Heparin infusion: Therapeutic x 3 Continue heparin infusion at 2000 units/hr.  Recheck aPTT and HL with AM labs. Will continue to dose with aPTT levels until aPTT and heparin  level correlate, then switch to heparin level monitoring.  CBC daily  Warfarin: Pharmacy to dose for 1600 (dose based on [place] Dosing Nomogram for Warfarin Naive Patients) Recheck INR with AM labs  Otelia Sergeant, PharmD, [facility] 12/15/2022 6:28 AM

## 2022-12-15 NOTE — Progress Notes (Signed)
Progress Note    12/15/2022 12:01 PM 2 Days Post-Op  Subjective:  Ryan [name] is a 52 y.o. 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 CTA of the chest with acute on chronic PE with right main pulmonary thrombus superimposed on bulky chronic clot which is seen to be adherent to the vessel wall from December CT angio of the chest. New near occlusion of the right main pulmonary artery and evidence of acute thrombus extension into the upper lobe branches.    Patient is now postop day #2 from a pulmonary thrombectomy.  He is resting comfortably in bed.  Remains on 6 L nasal cannula oxygen with O2 saturation between 90 and 92%.  Patient denies any chest pain this morning.  Patient states that the pain he was having yesterday has now resolved.  Patient still remains short of breath but he endorses his breathing is much better again today.   Vitals:   12/15/22 0811 12/15/22 1159  BP: (!) 137/99 (!) 128/101  Pulse: 74 82  Resp: 18 18  Temp: 98.2 F (36.8 C) 97.8 F (36.6 C)  SpO2: 95% 91%   Physical Exam: Cardiac:  irregular, without Murmurs, rubs or gallops; without carotid bruits  Lungs:  labored breathing, without Rales, rhonchi, wheezing  Incisions:  Right Jugular with dressing clean dry and intact.  Extremities:  Unable to palpate bilateral lower extremity pulses due to venous stasis and +4 edema.Patient is also morbidly obese impeding the ability to feel pulses  Abdomen:   Positive bowel sounds, soft, NT/ND, no masses  Neurologic: A&O X 3; No focal weakness or paresthesias are detected; speech is fluent/normal     CBC    Component Value Date/Time   WBC 3.6 (L) 12/15/2022 0455   RBC 4.68 12/15/2022 0455   HGB 14.4 12/15/2022 0455   HGB 15.9 10/15/2022 1005   HCT 44.4 12/15/2022  0455   HCT 48.4 10/15/2022 1005   PLT 207 12/15/2022 0455   PLT 280 10/15/2022 1005   MCV 94.9 12/15/2022 0455   MCV 93 10/15/2022 1005   MCV 90 12/28/2014 0444   MCH 30.8 12/15/2022 0455   MCHC 32.4 12/15/2022 0455   RDW 14.2 12/15/2022 0455   RDW 13.9 10/15/2022 1005   RDW 14.0 12/28/2014 0444   LYMPHSABS 1.4 10/15/2022 1005   LYMPHSABS 1.3 12/28/2014 0444   MONOABS 0.6 07/04/2022 0631   MONOABS 2.1 (H) 12/28/2014 0444   EOSABS 0.2 10/15/2022 1005   EOSABS 0.1 12/28/2014 0444   BASOSABS 0.0 10/15/2022 1005   BASOSABS 0.1 12/28/2014 0444    BMET    Component Value Date/Time   NA 136 12/15/2022 0455   NA 141 10/15/2022 1005   NA 130 (L) 12/28/2014 1602   K 4.2 12/15/2022 0455   K 3.8 12/28/2014 1602   CL 106 12/15/2022 0455   CL 94 (L) 12/28/2014 1602   CO2 21 (L) 12/15/2022 0455   CO2 24 12/28/2014 1602   GLUCOSE 101 (H) 12/15/2022 0455   GLUCOSE 121 (H) 12/28/2014 1602   BUN 15 12/15/2022 0455   BUN 12 10/15/2022 1005   BUN 57 (H) 12/28/2014 1602   CREATININE 1.23 12/15/2022 0455   CREATININE 5.55 (H) 12/28/2014 1602   CALCIUM 8.5 (L) 12/15/2022 0455   CALCIUM 8.1 (L) 12/28/2014 1602   GFRNONAA >  60 12/15/2022 0455   GFRNONAA 11 (L) 12/28/2014 1602   GFRAA 55 (L) 03/07/2020 2009   GFRAA 13 (L) 12/28/2014 1602    INR    Component Value Date/Time   INR 1.3 (H) 12/15/2022 0455   INR 1.2 12/23/2014 2219     Intake/Output Summary (Last 24 hours) at 12/15/2022 1201 Last data filed at 12/15/2022 1158 Gross per 24 hour  Intake 1660.17 ml  Output 1750 ml  Net -89.83 ml     Assessment/Plan:  52 y.o. male is s/p Mechanical pulmonary thrombectomy  2 Days Post-Op   PLAN: Patient is recovering as expected.  Remains on 6 L nasal cannula oxygen with O2 saturation 90 to 92%.  Patient noted to have acute on chronic pulmonary embolisms.  Patient may require oxygen at home after this mechanical thrombectomy.  Patient also states that he would like to start Coumadin and  stop taking his Xarelto.  Patient states when he was younger he was on Coumadin and did not have the problems he is having with clotting today.   Vascular surgery is okay with the patient being bridged from heparin back to Coumadin while recovering from his pulmonary embolism.  Consulted pharmacy for Coumadin teaching is ordered.  Per vascular surgery okay to discharge once patient is properly anticoagulated.  DVT prophylaxis:  Heparin Infusion    Marcie Bal [facility] 12/15/2022 12:01 PM

## 2022-12-15 NOTE — Consult Note (Signed)
ANTICOAGULATION CONSULT NOTE   Pharmacy Consult for: Warfarin w/ heparin drip bridge Indication: Acute on chronic PE  Allergies  Allergen Reactions   Metrizamide Other (See Comments)    Kidney failure requiring dialysis Kidney failure requiring dialysis    Clindamycin/Lincomycin Rash   Lincomycin Rash   Sulfa Antibiotics Rash    Patient Measurements: Weight: (!) 165.5 kg (364 lb 13.8 oz) Heparin Dosing Weight: 110.6 kg  Vital Signs: Temp: 97.8 F (36.6 C) (04/17 1159) Temp Source: Oral (04/17 1159) BP: 128/101 (04/17 1159) Pulse Rate: 82 (04/17 1159)  Labs: Recent Labs    12/13/22 0808 12/13/22 1130 12/13/22 1956 12/13/22 2303 12/14/22 0358 12/14/22 0959 12/14/22 1310 12/15/22 0455  HGB 16.0  --   --   --  14.6  --   --  14.4  HCT 49.6  --   --   --  44.7  --   --  44.4  PLT 245  --   --   --  220  --   --  207  APTT  --    < > 60*  --  81* 70*  --  68*  LABPROT  --   --  17.0*  --   --   --  15.3* 16.0*  INR  --   --  1.4*  --   --   --  1.2 1.3*  HEPARINUNFRC  --   --  >1.10*  --  >1.10*  --   --  0.52  CREATININE  --    < >  --  1.40* 1.32*  --   --  1.23  TROPONINIHS 33*  --  240*  --   --   --   --   --    < > = values in this interval not displayed.     Estimated Creatinine Clearance: 104 mL/min (by C-G formula based on SCr of 1.23 mg/dL).   Medical History: Past Medical History:  Diagnosis Date   Arrhythmia    atrial fibrillation   CHF (congestive heart failure)    Diabetes mellitus without complication    per pt-pre diabetic-Dr Dario Guardian stated said pt   DVT (deep venous thrombosis)    Hepatic steatosis    Hypertension    Morbid obesity with BMI of 50.0-59.9, adult    Peripheral vascular disease    Presence of IVC filter    Pulmonary embolus    Ventricular trigeminy    Weakness of both lower limbs     Medications:  Patient takes Xarelto 20 mg po daily at home, reports last dose 12/12/22 @ 2100.  Pt told pharmacist he did vomit last night  after taking dose.  Assessment: 52 yo male presented to ED with shortness of breath.  Patient currently on Xarelto at home for history of PE.  Chest CT was positive for acute on chronic PE.  Pharmacy consulted to initiate warfarin with a heparin infusion bridge until INR is therapeutic x 2.  4/15 1956 aPTT 60 HL > 1.1 4/16 0358 aPTT 81, therapeutic x 1 4/16 0959 aPTT 70, therapeutic x 2 4/17 0455 aPTT 68, therapeutic x 3, HL 0.52  Date: INR: Dose: 4/16 1.2  4/17 1.3   Goal of Therapy:  Heparin level 0.3-0.7 units/ml when heparin level and aPTT correlate.  aPTT 66-102 seconds INR: 2-3 Monitor platelets by anticoagulation protocol: Yes   Plan:  Heparin infusion: Therapeutic x 3 Continue heparin infusion at 2000 units/hr.  Recheck aPTT and HL with  AM labs. Will continue to dose with aPTT levels until aPTT and heparin level correlate, then switch to heparin level monitoring.  CBC daily  Warfarin: Repeat warfarin  x 1 today Pharmacy to dose for 1600 (dose based on [place] Dosing Nomogram for Warfarin Naive Patients) Recheck INR with AM labs  Bettey Costa, PharmD Clinical Pharmacist 12/15/2022 2:00 PM

## 2022-12-16 DIAGNOSIS — I2699 Other pulmonary embolism without acute cor pulmonale: Secondary | ICD-10-CM | POA: Diagnosis not present

## 2022-12-16 DIAGNOSIS — E1122 Type 2 diabetes mellitus with diabetic chronic kidney disease: Secondary | ICD-10-CM | POA: Diagnosis not present

## 2022-12-16 DIAGNOSIS — I10 Essential (primary) hypertension: Secondary | ICD-10-CM | POA: Diagnosis not present

## 2022-12-16 DIAGNOSIS — J9601 Acute respiratory failure with hypoxia: Secondary | ICD-10-CM | POA: Diagnosis not present

## 2022-12-16 LAB — GLUCOSE, CAPILLARY
Glucose-Capillary: 87 mg/dL (ref 70–99)
Glucose-Capillary: 89 mg/dL (ref 70–99)
Glucose-Capillary: 99 mg/dL (ref 70–99)
Glucose-Capillary: 101 mg/dL — ABNORMAL HIGH (ref 70–99)

## 2022-12-16 LAB — CBC
HCT: 43.5 % (ref 39.0–52.0)
Hemoglobin: 14.1 g/dL (ref 13.0–17.0)
MCH: 30.7 pg (ref 26.0–34.0)
MCHC: 32.4 g/dL (ref 30.0–36.0)
MCV: 94.6 fL (ref 80.0–100.0)
Platelets: 224 10*3/uL (ref 150–400)
RBC: 4.6 MIL/uL (ref 4.22–5.81)
RDW: 14.1 % (ref 11.5–15.5)
WBC: 4.1 10*3/uL (ref 4.0–10.5)
nRBC: 0 % (ref 0.0–0.2)

## 2022-12-16 LAB — HEPARIN LEVEL (UNFRACTIONATED): Heparin Unfractionated: 0.45 IU/mL (ref 0.30–0.70)

## 2022-12-16 LAB — PROTIME-INR
INR: 1.6 — ABNORMAL HIGH (ref 0.8–1.2)
Prothrombin Time: 18.9 seconds — ABNORMAL HIGH (ref 11.4–15.2)

## 2022-12-16 LAB — APTT: aPTT: 100 seconds — ABNORMAL HIGH (ref 24–36)

## 2022-12-16 MED ORDER — WARFARIN SODIUM 10 MG PO TABS
10.0000 mg | ORAL_TABLET | Freq: Once | ORAL | Status: AC
  Administered 2022-12-16: 10 mg via ORAL
  Filled 2022-12-16: qty 1

## 2022-12-16 NOTE — Progress Notes (Signed)
Progress Note    12/16/2022 1:50 PM 3 Days Post-Op  Subjective:  Ryan [name] is a 52 y.o. 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 CTA of the chest with acute on chronic PE with right main pulmonary thrombus superimposed on bulky chronic clot which is seen to be adherent to the vessel wall from December CT angio of the chest. New near occlusion of the right main pulmonary artery and evidence of acute thrombus extension into the upper lobe branches.    Patient is now postop day #3 from a pulmonary thrombectomy.  He is resting comfortably in bed.  Remains on 4 Liters  nasal cannula oxygen with O2 saturation between 90 and 92%.  Patient denies any chest pain this morning.  Patient still remains short of breath but he endorses his breathing is better again today. He endorses he has difficulty breathing while sleeping on his back. Therefore his saturations run lower at night and he needs more oxygen when sleeping.    Vitals:   12/16/22 0808 12/16/22 1130  BP: (!) 129/91 (!) 140/74  Pulse: 70 77  Resp: 16 18  Temp: 97.9 F (36.6 C) 97.7 F (36.5 C)  SpO2: 91% 100%   Physical Exam: Cardiac:   irregular, without Murmurs, rubs or gallops; without carotid bruits  Lungs:  labored breathing, without Rales, rhonchi, wheezing  Incisions:  Right Jugular without dressing. Site clean dry with erythema.  Extremities:  Unable to palpate bilateral lower extremity pulses due to venous stasis and +4 edema.Patient is also morbidly obese impeding the ability to feel pulses  Abdomen:  Positive bowel sounds, soft, NT/ND, no masses  Neurologic: A&O X 3; No focal weakness or paresthesias are detected; speech is fluent/normal   CBC    Component Value Date/Time   WBC 4.1 12/16/2022 0445   RBC 4.60 12/16/2022  0445   HGB 14.1 12/16/2022 0445   HGB 15.9 10/15/2022 1005   HCT 43.5 12/16/2022 0445   HCT 48.4 10/15/2022 1005   PLT 224 12/16/2022 0445   PLT 280 10/15/2022 1005   MCV 94.6 12/16/2022 0445   MCV 93 10/15/2022 1005   MCV 90 12/28/2014 0444   MCH 30.7 12/16/2022 0445   MCHC 32.4 12/16/2022 0445   RDW 14.1 12/16/2022 0445   RDW 13.9 10/15/2022 1005   RDW 14.0 12/28/2014 0444   LYMPHSABS 1.4 10/15/2022 1005   LYMPHSABS 1.3 12/28/2014 0444   MONOABS 0.6 07/04/2022 0631   MONOABS 2.1 (H) 12/28/2014 0444   EOSABS 0.2 10/15/2022 1005   EOSABS 0.1 12/28/2014 0444   BASOSABS 0.0 10/15/2022 1005   BASOSABS 0.1 12/28/2014 0444    BMET    Component Value Date/Time   NA 136 12/15/2022 0455   NA 141 10/15/2022 1005   NA 130 (L) 12/28/2014 1602   K 4.2 12/15/2022 0455   K 3.8 12/28/2014 1602   CL 106 12/15/2022 0455   CL 94 (L) 12/28/2014 1602   CO2 21 (L) 12/15/2022 0455   CO2 24 12/28/2014 1602   GLUCOSE 101 (H) 12/15/2022 0455   GLUCOSE 121 (H) 12/28/2014 1602   BUN 15 12/15/2022 0455   BUN 12 10/15/2022 1005   BUN 57 (H) 12/28/2014 1602   CREATININE 1.23 12/15/2022 0455   CREATININE 5.55 (H) 12/28/2014 1602   CALCIUM 8.5 (L) 12/15/2022  0455   CALCIUM 8.1 (L) 12/28/2014 1602   GFRNONAA >60 12/15/2022 0455   GFRNONAA 11 (L) 12/28/2014 1602   GFRAA 55 (L) 03/07/2020 2009   GFRAA 13 (L) 12/28/2014 1602    INR    Component Value Date/Time   INR 1.6 (H) 12/16/2022 0445   INR 1.2 12/23/2014 2219     Intake/Output Summary (Last 24 hours) at 12/16/2022 1350 Last data filed at 12/16/2022 1058 Gross per 24 hour  Intake 240 ml  Output 875 ml  Net -635 ml     Assessment/Plan:  52 y.o. male is s/p Mechanical Pulmonary Thrombectomy 3 Days Post-Op   PLAN: Patient is recovering as expected. Remains on 6 L nasal cannula oxygen with O2 saturation 90 to 92%. Patient noted to have acute on chronic pulmonary embolisms.  Patient started on coumadin yesterday. INR today is 1.6,  will continue heparin infusion to bridge coumadin until therapeutic.   Vascular Surgery okay with patient discharge when coumadin is therapeutic.  Patient will follow up with PCP for management of INR and coumadin.  Patient to follow up in 6 weeks with vascular surgery. No studies needed.   DVT prophylaxis:  Heparin infusion and Coumadin   Marcie Bal [facility] 12/16/2022 1:50 PM

## 2022-12-16 NOTE — TOC Progression Note (Addendum)
Transition of Care [facility]) - Progression Note    Patient Details  Name: Ryan [name] MRN: [id number] Date of Birth: November 02, 1970  [facility]) CM/SW Contact  Truddie Hidden, RN Phone Number: 12/16/2022, 1:59 PM  Clinical Narrative:    Spoke with patient regarding home oxygen. He stated he has a old oxygen tank from approximately 8 years ago. He does not  have a preference of DME agencies.   Request for home oxygen sent to [facility] at [place]. Advised of order and sats. Oxygen scheduled for delivery tomorrow. MD and nurse notified.         Expected Discharge Plan and Services                                               Social Determinants of Health (SDOH) Interventions SDOH Screenings   Depression (PHQ2-9): Low Risk  (10/15/2022)  Tobacco Use: Low Risk  (12/14/2022)    Readmission Risk Interventions     No data to display

## 2022-12-16 NOTE — Consult Note (Signed)
ANTICOAGULATION CONSULT NOTE   Pharmacy Consult for: Warfarin w/ heparin drip bridge Indication: Acute on chronic PE  Allergies  Allergen Reactions   Metrizamide Other (See Comments)    Kidney failure requiring dialysis Kidney failure requiring dialysis    Clindamycin/Lincomycin Rash   Lincomycin Rash   Sulfa Antibiotics Rash    Patient Measurements: Weight: (!) 165.5 kg (364 lb 13.8 oz) Heparin Dosing Weight: 110.6 kg  Vital Signs: Temp: 98 F (36.7 C) (04/18 0410) BP: 93/59 (04/18 0410) Pulse Rate: 73 (04/18 0410)  Labs: Recent Labs    12/13/22 0808 12/13/22 1130 12/13/22 1956 12/13/22 2303 12/14/22 0358 12/14/22 0959 12/14/22 1310 12/15/22 0455 12/16/22 0445  HGB 16.0  --   --   --  14.6  --   --  14.4 14.1  HCT 49.6  --   --   --  44.7  --   --  44.4 43.5  PLT 245  --   --   --  220  --   --  207 224  APTT  --    < > 60*  --  81* 70*  --  68* 100*  LABPROT  --    < > 17.0*  --   --   --  15.3* 16.0* 18.9*  INR  --    < > 1.4*  --   --   --  1.2 1.3* 1.6*  HEPARINUNFRC  --    < > >1.10*  --  >1.10*  --   --  0.52 0.45  CREATININE  --    < >  --  1.40* 1.32*  --   --  1.23  --   TROPONINIHS 33*  --  240*  --   --   --   --   --   --    < > = values in this interval not displayed.     Estimated Creatinine Clearance: 104 mL/min (by C-G formula based on SCr of 1.23 mg/dL).   Medical History: Past Medical History:  Diagnosis Date   Arrhythmia    atrial fibrillation   CHF (congestive heart failure)    Diabetes mellitus without complication    per pt-pre diabetic-Dr Dario Guardian stated said pt   DVT (deep venous thrombosis)    Hepatic steatosis    Hypertension    Morbid obesity with BMI of 50.0-59.9, adult    Peripheral vascular disease    Presence of IVC filter    Pulmonary embolus    Ventricular trigeminy    Weakness of both lower limbs     Medications:  Patient takes Xarelto 20 mg po daily at home, reports last dose 12/12/22 @ 2100.  Pt told  pharmacist he did vomit last night after taking dose.  Assessment: 52 yo male presented to ED with shortness of breath.  Patient currently on Xarelto at home for history of PE.  Chest CT was positive for acute on chronic PE.  Pharmacy consulted to initiate warfarin with a heparin infusion bridge until INR is therapeutic x 2.  4/15 1956 aPTT 60 HL > 1.1 4/16 0358 aPTT 81, therapeutic x 1 4/16 0959 aPTT 70, therapeutic x 2 4/17 0455 aPTT 68, therapeutic x 3, HL 0.52 4/18 0445 aPTT 100, therapeutic; HL 0.45, correlating w/ aPTT x 1  Date: INR: Dose: 4/16 1.2  4/17 1.3 4/18 1.6   Goal of Therapy:  Heparin level 0.3-0.7 units/ml when heparin level and aPTT correlate.  aPTT 66-102 seconds INR: 2-3  Monitor platelets by anticoagulation protocol: Yes   Plan:  Heparin infusion: Therapeutic x 5 Continue heparin infusion at 2000 units/hr.  Recheck aPTT and HL with AM labs. Will continue to dose with aPTT levels until aPTT and heparin level correlate, then switch to heparin level monitoring.  CBC daily  Warfarin: Pharmacy to dose for 1600 (dose based on [place] Dosing Nomogram for Warfarin Naive Patients) Recheck INR with AM labs  Otelia Sergeant, PharmD, [facility] 12/16/2022 6:42 AM

## 2022-12-16 NOTE — Progress Notes (Addendum)
[facility]  - [place] at [facility]   PATIENT NAME: Ryan [name]    MR#:  [id number]  DATE OF BIRTH:  1971-05-15  SUBJECTIVE:  patient overall doing well. He desaturate on and off. Currently on oxygen 4 L nasal cannula. Does not wear oxygen chronically. Denies chest pain. Right neck firm node at site of catheter insertion--no oozing   VITALS:  Blood pressure (!) 129/91, pulse 70, temperature 97.9 F (36.6 C), temperature source Oral, resp. rate 16, weight (!) 165.5 kg, SpO2 91 %.  PHYSICAL EXAMINATION:   GENERAL:  52 y.o.-year-old patient with no acute distress. Morbid obesity Right neck small 2 cm hard nodule at catheter insertion site--no pain, no oozing LUNGS: Normal breath sounds bilaterally, no wheezing CARDIOVASCULAR: S1, S2 normal. No murmur   ABDOMEN: Soft, nontender, nondistended. Bowel sounds present.  EXTREMITIES: No  edema b/l.    NEUROLOGIC: nonfocal  patient is alert and awake SKIN: No obvious rash, lesion, or ulcer.   LABORATORY PANEL:  CBC Recent Labs  Lab 12/16/22 0445  WBC 4.1  HGB 14.1  HCT 43.5  PLT 224     Chemistries  Recent Labs  Lab 12/14/22 0358 12/15/22 0455  NA 137 136  K 3.9 4.2  CL 106 106  CO2 24 21*  GLUCOSE 116* 101*  BUN 18 15  CREATININE 1.32* 1.23  CALCIUM 8.1* 8.5*  MG  --  2.0  AST 18  --   ALT 16  --   ALKPHOS 48  --   BILITOT 1.2  --     Assessment and Plan 52 y.o. 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  Patient reports increased work of breathing and cough over the past 2 to 3 weeks.  Patient reports he has been relatively compliant with Xarelt0   CTA chest showed acute on chronic PE with right main pulmonary thrombus superimposed on bulky chronic clot which is seen adherent to the vessel wall.   Patient was admitted for further evaluation.  Vascular surgery was consulted Patient underwent mechanical thrombectomy,  tolerated well.  Patient continued on heparin with the plan to bridge with Coumadin.   Acute hypoxic respiratory failure: Recurrent pulmonary embolism: --Patient presented with cough and increased work of breathing. --He was hypoxic requiring up to 6 L of supplemental oxygen to keep sat above 94%. --CT chest showed acute on chronic pulmonary embolism. --Continue supplemental oxygen and wean as tolerated, --Vascular surgery consulted.  Patient underwent mechanical thrombectomy,  tolerated well. --IV heparin and bridge with Coumadin. --INR 1.6 --Assess for home oxygen Patient Saturations on Room Air at Rest = 84%  Patient Saturations on Room Air while Ambulating = 79% Patient Saturations on 2  Liters of oxygen while Ambulating = 92%  Please briefly explain why patient needs home oxygen: Patients oxygen saturation dropped to 79% while ambulating 18feet on room air and returned to 92% when placed on 2L nasal cannula   CKD stage IIIa: Baseline serum creatinine remains around 1.5. Creatinine is at baseline. Avoid nephrotoxic medications   Type 2 diabetes with hyperglycemia: --Continue regular insulin sliding scale.   --hemoglobin A1c feb 2024--6.6 --on Mounjaro at home   Hypertension: --BP stable. --Continue metoprolol   Chronic diastolic CHF: --2D echo August 2023 with EF 55 to 60% --Large body habitus in setting  of morbid obesity --Does appear to be fairly euvolemic at present. --No cardiomegaly or pulmonary congestion on imaging --Continue Lasix and Entresto   Atrial flutter with rapid ventricular response S/p ablation  Heart rate is controlled, EKG normal sinus rhythm. Continue metoprolol         DVT prophylaxis: Heparin  > Coumadin Code Status: Full code Family Communication:No family at bed side. Disposition Plan: home once INR therapeutic Level of  care: Med-Surg Status is: Inpatient Remains inpatient appropriate because: Once INR is therapeutic   EDD once INR >2.0    TOTAL TIME TAKING CARE OF THIS PATIENT: 35 minutes.  >50% time spent on counselling and coordination of care  Note: This dictation was prepared with Dragon dictation along with smaller phrase technology. Any transcriptional errors that result from this process are unintentional.  Enedina Finner M.D    [facility]   CC: Primary care physician; [name], NP

## 2022-12-16 NOTE — Consult Note (Signed)
ANTICOAGULATION CONSULT NOTE   Pharmacy Consult for: Warfarin w/ heparin drip bridge Indication: Acute on chronic PE  Allergies  Allergen Reactions   Metrizamide Other (See Comments)    Kidney failure requiring dialysis Kidney failure requiring dialysis    Clindamycin/Lincomycin Rash   Lincomycin Rash   Sulfa Antibiotics Rash    Patient Measurements: Weight: (!) 165.5 kg (364 lb 13.8 oz) Heparin Dosing Weight: 110.6 kg  Vital Signs: Temp: 97.7 F (36.5 C) (04/18 1130) Temp Source: Oral (04/18 1130) BP: 140/74 (04/18 1130) Pulse Rate: 77 (04/18 1130)  Labs: Recent Labs    12/13/22 1956 12/13/22 1956 12/13/22 2303 12/14/22 0358 12/14/22 0959 12/14/22 1310 12/15/22 0455 12/16/22 0445  HGB  --    < >  --  14.6  --   --  14.4 14.1  HCT  --   --   --  44.7  --   --  44.4 43.5  PLT  --   --   --  220  --   --  207 224  APTT 60*  --   --  81* 70*  --  68* 100*  LABPROT 17.0*  --   --   --   --  15.3* 16.0* 18.9*  INR 1.4*  --   --   --   --  1.2 1.3* 1.6*  HEPARINUNFRC >1.10*  --   --  >1.10*  --   --  0.52 0.45  CREATININE  --   --  1.40* 1.32*  --   --  1.23  --   TROPONINIHS 240*  --   --   --   --   --   --   --    < > = values in this interval not displayed.     Estimated Creatinine Clearance: 104 mL/min (by C-G formula based on SCr of 1.23 mg/dL).   Medical History: Past Medical History:  Diagnosis Date   Arrhythmia    atrial fibrillation   CHF (congestive heart failure)    Diabetes mellitus without complication    per pt-pre diabetic-Dr Dario Guardian stated said pt   DVT (deep venous thrombosis)    Hepatic steatosis    Hypertension    Morbid obesity with BMI of 50.0-59.9, adult    Peripheral vascular disease    Presence of IVC filter    Pulmonary embolus    Ventricular trigeminy    Weakness of both lower limbs     Medications:  Patient takes Xarelto 20 mg po daily at home, reports last dose 12/12/22 @ 2100.  Pt told pharmacist he did vomit last night  after taking dose.  Assessment: 52 yo male presented to ED with shortness of breath.  Patient currently on Xarelto at home for history of PE.  Chest CT was positive for acute on chronic PE.  Pharmacy consulted to initiate warfarin with a heparin infusion bridge until INR is therapeutic x 2.  4/15 1956 aPTT 60 HL > 1.1 4/16 0358 aPTT 81, therapeutic x 1 4/16 0959 aPTT 70, therapeutic x 2 4/17 0455 aPTT 68, therapeutic x 3, HL 0.52  Date: INR: Dose: 4/16 1.2  4/17 1.3  4/18 1.6   Goal of Therapy:  Heparin level 0.3-0.7 units/ml when heparin level and aPTT correlate.  aPTT 66-102 seconds INR: 2-3 Monitor platelets by anticoagulation protocol: Yes   Plan:  Heparin infusion: Therapeutic x 3 Continue heparin infusion at 2000 units/hr.  Recheck aPTT and HL with AM labs. Will continue  to dose with aPTT levels until aPTT and heparin level correlate, then switch to heparin level monitoring.  CBC daily  Warfarin: Repeat warfarin  x 1 today Pharmacy to dose for 1600 (dose based on [place] Dosing Nomogram for Warfarin Naive Patients) Recheck INR with AM labs  Bettey Costa, PharmD Clinical Pharmacist 12/16/2022 11:54 AM

## 2022-12-16 NOTE — Progress Notes (Signed)
SATURATION QUALIFICATIONS: (This note is used to comply with regulatory documentation for home oxygen)  Patient Saturations on Room Air at Rest = 84%  Patient Saturations on Room Air while Ambulating = 79%  Patient Saturations on 2  Liters of oxygen while Ambulating = 92%  Please briefly explain why patient needs home oxygen: Patients oxygen saturation dropped to 79% while ambulating 16feet on room air and returned to 92% when placed on 2L nasal cannula

## 2022-12-17 LAB — APTT: aPTT: 118 seconds — ABNORMAL HIGH (ref 24–36)

## 2022-12-17 LAB — CBC
HCT: 45 % (ref 39.0–52.0)
Hemoglobin: 14.6 g/dL (ref 13.0–17.0)
MCH: 30.6 pg (ref 26.0–34.0)
MCHC: 32.4 g/dL (ref 30.0–36.0)
MCV: 94.3 fL (ref 80.0–100.0)
Platelets: 234 10*3/uL (ref 150–400)
RBC: 4.77 MIL/uL (ref 4.22–5.81)
RDW: 14.1 % (ref 11.5–15.5)
WBC: 3 10*3/uL — ABNORMAL LOW (ref 4.0–10.5)
nRBC: 0 % (ref 0.0–0.2)

## 2022-12-17 LAB — HEPARIN LEVEL (UNFRACTIONATED): Heparin Unfractionated: 0.44 IU/mL (ref 0.30–0.70)

## 2022-12-17 LAB — PROTIME-INR
INR: 2.1 — ABNORMAL HIGH (ref 0.8–1.2)
Prothrombin Time: 23.6 seconds — ABNORMAL HIGH (ref 11.4–15.2)

## 2022-12-17 LAB — GLUCOSE, CAPILLARY: Glucose-Capillary: 94 mg/dL (ref 70–99)

## 2022-12-17 MED ORDER — WARFARIN SODIUM 10 MG PO TABS: 10.0000 mg | ORAL_TABLET | Freq: Every day | ORAL | 1 refills | Status: DC

## 2022-12-17 MED ORDER — WARFARIN SODIUM 10 MG PO TABS
10.0000 mg | ORAL_TABLET | Freq: Once | ORAL | Status: DC
  Filled 2022-12-17: qty 1

## 2022-12-17 MED ORDER — WARFARIN SODIUM 10 MG PO TABS: 10.0000 mg | ORAL_TABLET | Freq: Once | ORAL | 1 refills | Status: DC

## 2022-12-17 NOTE — Consult Note (Signed)
ANTICOAGULATION CONSULT NOTE   Pharmacy Consult for: Warfarin w/ heparin drip bridge Indication: Acute on chronic PE  Allergies  Allergen Reactions   Metrizamide Other (See Comments)    Kidney failure requiring dialysis Kidney failure requiring dialysis    Clindamycin/Lincomycin Rash   Lincomycin Rash   Sulfa Antibiotics Rash    Patient Measurements: Weight: (!) 165.5 kg (364 lb 13.8 oz) Heparin Dosing Weight: 110.6 kg  Vital Signs: Temp: 97.8 F (36.6 C) (04/19 0547) BP: 158/108 (04/19 0800) Pulse Rate: 64 (04/19 0547)  Labs: Recent Labs    12/15/22 0455 12/16/22 0445 12/17/22 0514  HGB 14.4 14.1 14.6  HCT 44.4 43.5 45.0  PLT 207 224 234  APTT 68* 100* 118*  LABPROT 16.0* 18.9* 23.6*  INR 1.3* 1.6* 2.1*  HEPARINUNFRC 0.52 0.45 0.44  CREATININE 1.23  --   --      Estimated Creatinine Clearance: 104 mL/min (by C-G formula based on SCr of 1.23 mg/dL).   Medical History: Past Medical History:  Diagnosis Date   Arrhythmia    atrial fibrillation   CHF (congestive heart failure)    Diabetes mellitus without complication    per pt-pre diabetic-Dr Dario Guardian stated said pt   DVT (deep venous thrombosis)    Hepatic steatosis    Hypertension    Morbid obesity with BMI of 50.0-59.9, adult    Peripheral vascular disease    Presence of IVC filter    Pulmonary embolus    Ventricular trigeminy    Weakness of both lower limbs     Medications:  Patient takes Xarelto 20 mg po daily at home, reports last dose 12/12/22 @ 2100.  Pt told pharmacist he did vomit last night after taking dose.  Assessment: 52 yo male presented to ED with shortness of breath.  Patient currently on Xarelto at home for history of PE.  Chest CT was positive for acute on chronic PE.  Pharmacy consulted to initiate warfarin with a heparin infusion bridge until INR is therapeutic x 2.  4/15 1956 aPTT 60 HL > 1.1 4/16 0358 aPTT 81, therapeutic x 1 4/16 0959 aPTT 70, therapeutic x 2 4/17 0455 aPTT  68, therapeutic x 3, HL 0.52  Date: INR: Dose: 4/16 1.2  4/17 1.3  4/18 1.6  4/19 2.1   Goal of Therapy:  Heparin level 0.3-0.7 units/ml when heparin level and aPTT correlate.  aPTT 66-102 seconds INR: 2-3 Monitor platelets by anticoagulation protocol: Yes   Plan:  Heparin infusion: Therapeutic x 3 Continue heparin infusion at 2000 units/hr.  Recheck aPTT and HL with AM labs. Will continue to dose with aPTT levels until aPTT and heparin level correlate, then switch to heparin level monitoring.  CBC daily  Warfarin: Will continue with warfarin . Patient's INR seems to be stable on this dose and will likely discharge as maintenance dose. Pharmacy to dose for 1600 (dose based on [place] Dosing Nomogram for Warfarin Naive Patients) Recheck INR with AM labs  Bettey Costa, PharmD Clinical Pharmacist 12/17/2022 10:42 AM

## 2022-12-17 NOTE — Discharge Instructions (Signed)
Use your oxygen, inhalers as before

## 2022-12-17 NOTE — TOC Transition Note (Signed)
Transition of Care [facility]) - CM/SW Discharge Note   Patient Details  Name: BANKS CHAIKIN MRN: [id number] Date of Birth: September 17, 1970  [facility] of [facility]) CM/SW Contact:  Truddie Hidden, RN Phone Number: 12/17/2022, 11:16 AM   Clinical Narrative:    Sherron Monday with Morrie Sheldon from [facility] to confirm delivery of oxygen. Morrie Sheldon stated oxygen was delivered to the patient's room yesterday.   TOC signing off.    Final next level of care: Home/Self Care Barriers to Discharge: Barriers Resolved   Patient Goals and CMS Choice      Discharge Placement                         Discharge Plan and Services Additional resources added to the After Visit Summary for                  DME Arranged: Oxygen DME Agency: [name] Date DME Agency Contacted: 12/17/22 Time DME Agency Contacted: [phone] Representative spoke with at DME Agency: Morrie Sheldon            Social Determinants of Health (SDOH) Interventions SDOH Screenings   Depression (PHQ2-9): Low Risk  (10/15/2022)  Tobacco Use: Low Risk  (12/14/2022)     Readmission Risk Interventions     No data to display

## 2022-12-17 NOTE — Progress Notes (Signed)
Progress Note    12/17/2022 1:19 PM 4 Days Post-Op  Subjective:  Ryan [name] is a 52 y.o. 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 CTA of the chest with acute on chronic PE with right main pulmonary thrombus superimposed on bulky chronic clot which is seen to be adherent to the vessel wall from December CT angio of the chest. New near occlusion of the right main pulmonary artery and evidence of acute thrombus extension into the upper lobe branches.    Patient is now postop day #4 from a pulmonary thrombectomy.  He is resting comfortably in bed.  Remains on 4 Liters  nasal cannula oxygen with O2 saturation between 90 and 92%.  Patient denies any chest pain this morning.  Patient still remains short of breath but he endorses his breathing is better again today. He endorses he has difficulty breathing while sleeping on his back. Therefore his saturations run lower at night and he needs more oxygen when sleeping.    Vitals:   12/17/22 0547 12/17/22 0800  BP: (!) 134/90 (!) 158/108  Pulse: 64   Resp: 18 20  Temp: 97.8 F (36.6 C)   SpO2: 93% 98%   Physical Exam: Cardiac:  irregular, without Murmurs, rubs or gallops; without carotid bruits  Lungs:  labored breathing, without Rales, rhonchi, wheezing  Incisions:  Right Jugular without dressing. Site clean dry with erythema.  Extremities:  Unable to palpate bilateral lower extremity pulses due to venous stasis and +4 edema.Patient is also morbidly obese impeding the ability to feel pulses  Abdomen:  Positive bowel sounds, soft, NT/ND, no masses  Neurologic: A&O X 3; No focal weakness or paresthesias are detected; speech is fluent/normal     CBC    Component Value Date/Time   WBC 3.0 (L) 12/17/2022 0514   RBC 4.77 12/17/2022 0514   HGB  14.6 12/17/2022 0514   HGB 15.9 10/15/2022 1005   HCT 45.0 12/17/2022 0514   HCT 48.4 10/15/2022 1005   PLT 234 12/17/2022 0514   PLT 280 10/15/2022 1005   MCV 94.3 12/17/2022 0514   MCV 93 10/15/2022 1005   MCV 90 12/28/2014 0444   MCH 30.6 12/17/2022 0514   MCHC 32.4 12/17/2022 0514   RDW 14.1 12/17/2022 0514   RDW 13.9 10/15/2022 1005   RDW 14.0 12/28/2014 0444   LYMPHSABS 1.4 10/15/2022 1005   LYMPHSABS 1.3 12/28/2014 0444   MONOABS 0.6 07/04/2022 0631   MONOABS 2.1 (H) 12/28/2014 0444   EOSABS 0.2 10/15/2022 1005   EOSABS 0.1 12/28/2014 0444   BASOSABS 0.0 10/15/2022 1005   BASOSABS 0.1 12/28/2014 0444    BMET    Component Value Date/Time   NA 136 12/15/2022 0455   NA 141 10/15/2022 1005   NA 130 (L) 12/28/2014 1602   K 4.2 12/15/2022 0455   K 3.8 12/28/2014 1602   CL 106 12/15/2022 0455   CL 94 (L) 12/28/2014 1602   CO2 21 (L) 12/15/2022 0455   CO2 24 12/28/2014 1602   GLUCOSE 101 (H) 12/15/2022 0455   GLUCOSE 121 (H) 12/28/2014 1602   BUN 15 12/15/2022 0455   BUN 12 10/15/2022 1005   BUN 57 (H) 12/28/2014 1602   CREATININE 1.23 12/15/2022 0455   CREATININE 5.55 (H) 12/28/2014 1602   CALCIUM 8.5 (L) 12/15/2022 0455  CALCIUM 8.1 (L) 12/28/2014 1602   GFRNONAA >60 12/15/2022 0455   GFRNONAA 11 (L) 12/28/2014 1602   GFRAA 55 (L) 03/07/2020 2009   GFRAA 13 (L) 12/28/2014 1602    INR    Component Value Date/Time   INR 2.1 (H) 12/17/2022 0514   INR 1.2 12/23/2014 2219     Intake/Output Summary (Last 24 hours) at 12/17/2022 1319 Last data filed at 12/17/2022 1049 Gross per 24 hour  Intake 900 ml  Output 175 ml  Net 725 ml     Assessment/Plan:  52 y.o. male is s/p Mechanical Pulmonary Thrombectomy  4 Days Post-Op   PLAN: Patient is recovering as expected. Remains on 6 L nasal cannula oxygen with O2 saturation 90 to 92%. Patient noted to have acute on chronic pulmonary embolisms.  Patient started on coumadin yesterday. INR today is 2.0 will continue  heparin infusion to bridge coumadin until therapeutic.   Vascular Surgery okay with patient discharge when coumadin is therapeutic.  Patient will follow up with PCP for management of INR and coumadin.  Patient to follow up in 6 weeks with vascular surgery. No studies needed.  DVT prophylaxis:  Heparin infusion and Coumadin    Marcie Bal [facility] 12/17/2022 1:19 PM

## 2022-12-17 NOTE — Consult Note (Signed)
ANTICOAGULATION CONSULT NOTE   Pharmacy Consult for: Warfarin w/ heparin drip bridge Indication: Acute on chronic PE  Allergies  Allergen Reactions   Metrizamide Other (See Comments)    Kidney failure requiring dialysis Kidney failure requiring dialysis    Clindamycin/Lincomycin Rash   Lincomycin Rash   Sulfa Antibiotics Rash    Patient Measurements: Weight: (!) 165.5 kg (364 lb 13.8 oz) Heparin Dosing Weight: 110.6 kg  Vital Signs: Temp: 97.8 F (36.6 C) (04/19 0547) BP: 134/90 (04/19 0547) Pulse Rate: 64 (04/19 0547)  Labs: Recent Labs    12/15/22 0455 12/16/22 0445 12/17/22 0514  HGB 14.4 14.1 14.6  HCT 44.4 43.5 45.0  PLT 207 224 234  APTT 68* 100* 118*  LABPROT 16.0* 18.9* 23.6*  INR 1.3* 1.6* 2.1*  HEPARINUNFRC 0.52 0.45 0.44  CREATININE 1.23  --   --      Estimated Creatinine Clearance: 104 mL/min (by C-G formula based on SCr of 1.23 mg/dL).   Medical History: Past Medical History:  Diagnosis Date   Arrhythmia    atrial fibrillation   CHF (congestive heart failure)    Diabetes mellitus without complication    per pt-pre diabetic-Dr Dario Guardian stated said pt   DVT (deep venous thrombosis)    Hepatic steatosis    Hypertension    Morbid obesity with BMI of 50.0-59.9, adult    Peripheral vascular disease    Presence of IVC filter    Pulmonary embolus    Ventricular trigeminy    Weakness of both lower limbs     Medications:  Patient takes Xarelto 20 mg po daily at home, reports last dose 12/12/22 @ 2100.  Pt told pharmacist he did vomit last night after taking dose.  Assessment: 52 yo male presented to ED with shortness of breath.  Patient currently on Xarelto at home for history of PE.  Chest CT was positive for acute on chronic PE.  Pharmacy consulted to initiate warfarin with a heparin infusion bridge until INR is therapeutic x 2.  4/15 1956 aPTT 60 HL > 1.1 4/16 0358 aPTT 81, therapeutic x 1 4/16 0959 aPTT 70, therapeutic x 2 4/17 0455 aPTT  68, therapeutic x 3, HL 0.52 4/18 0445 aPTT 100, therapeutic; HL 0.45, correlating w/ aPTT x 1 4/19 0514 HL 0.44, therapeutic x 5  Date: INR: Dose: 4/16 1.2  4/17 1.3  4/18 1.6  4/19 2.1  Goal of Therapy:  Heparin level 0.3-0.7 units/ml when heparin level and aPTT correlate.  aPTT 66-102 seconds INR: 2-3 Monitor platelets by anticoagulation protocol: Yes   Plan:  Heparin infusion: Therapeutic x 5 Continue heparin infusion at 2000 units/hr.  Recheck HL daily w/ AM labs while therapeutic CBC daily while on heparin  Warfarin: Pharmacy to dose for 1600 (dose based on [place] Dosing Nomogram for Warfarin Naive Patients) Recheck INR with AM labs  Otelia Sergeant, PharmD, [facility] 12/17/2022 6:03 AM

## 2022-12-17 NOTE — Discharge Summary (Signed)
Physician Discharge Summary   Patient: Ryan [name] MRN: [id number] DOB: [birth year]  Admit date:     12/13/2022  Discharge date: 12/17/22  Discharge Physician: Enedina Finner   PCP: [name], NP   Recommendations at discharge:   Use your oxygen, inhalers, nebulizers as before F/u Dr [facility] on your appt F/u PCP in 1-2 weeks  Discharge Diagnoses: Active Problems:   Pulmonary emboli   Acute respiratory failure with hypoxia   Hypertension   Type 2 diabetes mellitus with diabetic chronic kidney disease   CKD (chronic kidney disease), stage IIIa   Chronic diastolic CHF (congestive heart failure)   Atrial flutter with rapid ventricular response 52 y.o. 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  Patient reports increased work of breathing and cough over the past 2 to 3 weeks.  Patient reports he has been relatively compliant with Xarelt0    CTA chest showed acute on chronic PE with right main pulmonary thrombus superimposed on bulky chronic clot which is seen adherent to the vessel wall.  Patient was admitted for further evaluation.  Vascular surgery was consulted Patient underwent mechanical thrombectomy,  tolerated well.  Patient continued on heparin with the plan to bridge with Coumadin.    Acute hypoxic respiratory failure: Recurrent pulmonary embolism: --Patient presented with cough and increased work of breathing. --He was hypoxic requiring up to 6 L of supplemental oxygen to keep sat above 94%. --CT chest showed acute on chronic pulmonary embolism. --Continue supplemental oxygen and wean as tolerated, --Vascular surgery consulted.  Patient underwent mechanical thrombectomy,  tolerated well. --IV heparin and bridge with Coumadin. --INR 2.1 today--d/w pharmacist and will d/c  on 10 mg qd. --Assess for home oxygen Patient Saturations on Room Air at Rest = 84%  Patient Saturations on Room Air while Ambulating = 79% Patient Saturations on 2  Liters of oxygen while Ambulating = 92%  Please briefly explain why patient needs home oxygen: Patients oxygen saturation dropped to 79% while ambulating 10 feet on room air and returned to 92% when placed on 2L nasal cannula   CKD stage IIIa: Baseline serum creatinine remains around 1.5. Creatinine is at baseline. Avoid nephrotoxic medications   Type 2 diabetes with hyperglycemia: --Continue regular insulin sliding scale.   --hemoglobin A1c feb 2024--6.6 --on Mounjaro at home   Hypertension: --BP stable. --Continue metoprolol   Chronic diastolic CHF: --2D echo August 2023 with EF 55 to 60% --Large body habitus in setting of morbid obesity --Does appear to be fairly euvolemic at present. --No cardiomegaly or pulmonary congestion on imaging --Continue Lasix and Entresto   Atrial flutter with rapid ventricular response S/p ablation in the past Heart rate is controlled, EKG normal sinus rhythm. Continue metoprolol      overall stable d/c home   DVT prophylaxis: Heparin  > Coumadin Code Status: Full code Family Communication:No family at bed side. Disposition Plan: home once INR therapeutic       Consultants: vascular surgery Procedures performed: mechanical thrombectomy for PE  Disposition: Home Diet recommendation:  Discharge Diet Orders (From admission, onward)     Start     Ordered   12/17/22 0000  Diet - low sodium heart healthy        12/17/22 0847           Cardiac  diet DISCHARGE MEDICATION: Allergies as of 12/17/2022       Reactions   Metrizamide Other (See Comments)   Kidney failure requiring dialysis Kidney failure requiring dialysis   Clindamycin/lincomycin Rash   Lincomycin Rash   Sulfa Antibiotics Rash        Medication List     STOP taking these medications     Xarelto 20 MG Tabs tablet Generic drug: rivaroxaban       TAKE these medications    albuterol 108 (90 Base) MCG/ACT inhaler Commonly known as: VENTOLIN HFA Inhale 2 puffs into the lungs every 6 (six) hours as needed for wheezing or shortness of breath.   furosemide 40 MG tablet Commonly known as: LASIX Take 40 mg by mouth every other day.   magnesium oxide 400 (240 Mg) MG tablet Commonly known as: MAG-OX Take 400 mg by mouth daily.   metoprolol succinate 25 MG 24 hr tablet Commonly known as: TOPROL-XL Take 1 tablet (25 mg total) by mouth daily.   sacubitril-valsartan 24-26 MG Commonly known as: ENTRESTO Take 1 tablet by mouth 2 (two) times daily.   tirzepatide 5 MG/0.5ML Pen Commonly known as: MOUNJARO Inject 5 mg into the skin once a week. What changed: Another medication with the same name was removed. Continue taking this medication, and follow the directions you see here.   warfarin 10 MG tablet Commonly known as: COUMADIN Take 1 tablet (10 mg total) by mouth one time only at 4 PM.               Durable Medical Equipment  (From admission, onward)           Start     Ordered   12/16/22 1355  For home use only DME oxygen  Once       Question Answer Comment  Length of Need Lifetime   Mode or (Route) Nasal cannula   Liters per Minute 2   Frequency Continuous (stationary and portable oxygen unit needed)   Oxygen conserving device Yes   Oxygen delivery system Gas      12/16/22 1354            Follow-up Information     [name], NP. Schedule an appointment as soon as possible for a visit in 1 week(s).   Specialty: Nurse Practitioner Contact information: [address] [place] [address] [phone]         Yevonne Pax, MD. Go to.   Specialties: Internal Medicine, Pulmonary Disease Why: on your appt Contact information: [address] [place] [address] [phone]                Discharge  Exam: Ceasar Mons Weights   12/13/22 1340 12/13/22 2004  Weight: (!) 176.2 kg (!) 165.5 kg     Condition at discharge: good  The results of significant diagnostics from this hospitalization (including imaging, microbiology, ancillary and laboratory) are listed below for reference.   Imaging Studies: PERIPHERAL VASCULAR CATHETERIZATION  Result Date: 12/13/2022 See surgical note for result.  CT Angio Chest PE W and/or Wo Contrast  Result Date: 12/13/2022 CLINICAL DATA:  52 year old male with shortness of breath and dull chest pain. Persistent cough. History of previous pulmonary embolus. EXAM: CT ANGIOGRAPHY CHEST WITH CONTRAST TECHNIQUE: Multidetector CT imaging of the chest was performed using the standard protocol during bolus administration of intravenous contrast. Multiplanar CT image reconstructions and MIPs were obtained to evaluate the vascular anatomy. RADIATION DOSE REDUCTION: This exam was performed according to  the departmental dose-optimization program which includes automated exposure control, adjustment of the mA and/or kV according to patient size and/or use of iterative reconstruction technique. CONTRAST:  75mL OMNIPAQUE IOHEXOL 350 MG/ML SOLN COMPARISON:  CTA chest 07/30/2022 and earlier. FINDINGS: Cardiovascular: Adequate contrast bolus timing in the pulmonary arterial tree. Bulky but chronic, previous chronic adherent and mostly nonocclusive thrombus in the right main pulmonary artery, but progressed and now near occlusive clot there on series 7, image 88. New clot from that segment tracking into right upper lobe branches. No saddle embolus. Respiratory motion limits detail of the right lower lobe pulmonary arteries, but chronic mural thrombus there appears fairly stable since December. Left main pulmonary artery remains patent. Left lobar and segmental arteries appear to be patent. Stable heart size. No pericardial effusion. Early contrast in the aorta which appears stable and  negative. Chronic anterior chest wall venous collaterals appear stable since December and are not enhancing in the early contrast phase. Mediastinum/Nodes: Negative for mediastinal mass or lymphadenopathy. Lungs/Pleura: Major airways remain patent. Respiratory motion more so in the lower lobes. Stable lung volumes. No pleural effusion. No pulmonary infarct at this time. Stable ventilation compared to December. Upper Abdomen: Chronic suprarenal IVC filter appears stable since December. Stable and negative visible essentially noncontrast liver, gallbladder, spleen, pancreas, adrenal glands, kidneys, and bowel. Ventral abdominal wall venous collaterals continue from the chest and appears stable. Musculoskeletal: No acute osseous abnormality identified. Mid to lower thoracic flowing endplate osteophytes resulting in some levels of chronic interbody ankylosis. Review of the MIP images confirms the above findings. IMPRESSION: 1. Positive for Acute On Chronic PE: Right main pulmonary thrombus superimposed on bulky chronic clot which was seen to be adherent to the vessel wall on December CTA. New near-occlusion of the right main pulmonary artery, and evidence of acute thrombus extension into upper lobe branches. 2. No pulmonary infarct or pleural effusion at this time. Stable lung ventilation compared to December. 3. No other acute finding in the chest. Chronic suprarenal IVC filter appears stable since December. Critical Value/emergent results were called by telephone at the time of interpretation on 12/13/2022 at 1:07 pm to Dr. Phineas Semen , who verbally acknowledged these results. Electronically Signed   By: Odessa Fleming M.D.   On: 12/13/2022 13:10   DG Chest Port 1 View  Result Date: 12/13/2022 CLINICAL DATA:  Shortness of breath EXAM: PORTABLE CHEST 1 VIEW COMPARISON:  Chest x-ray July 30, 2022 FINDINGS: The cardiomediastinal silhouette is unchanged in contour. No focal pulmonary opacity. No pleural effusion or  pneumothorax. The visualized upper abdomen is unremarkable. No acute osseous abnormality. IMPRESSION: No acute cardiopulmonary abnormality. Electronically Signed   By: Jacob Moores M.D.   On: 12/13/2022 08:11    Microbiology: Results for orders placed or performed during the hospital encounter of 12/13/22  Resp panel by RT-PCR (RSV, Flu A&B, Covid) Anterior Nasal Swab     Status: None   Collection Time: 12/13/22  8:22 AM   Specimen: Anterior Nasal Swab  Result Value Ref Range Status   SARS Coronavirus 2 by RT PCR NEGATIVE NEGATIVE Final    Comment: (NOTE) SARS-CoV-2 target nucleic acids are NOT DETECTED.  The SARS-CoV-2 RNA is generally detectable in upper respiratory specimens during the acute phase of infection. The lowest concentration of SARS-CoV-2 viral copies this assay can detect is 138 copies/mL. A negative result does not preclude SARS-Cov-2 infection and should not be used as the sole basis for treatment or other patient management decisions. A  negative result may occur with  improper specimen collection/handling, submission of specimen other than nasopharyngeal swab, presence of viral mutation(s) within the areas targeted by this assay, and inadequate number of viral copies(<138 copies/mL). A negative result must be combined with clinical observations, patient history, and epidemiological information. The expected result is Negative.  Fact Sheet for Patients:  BloggerCourse.com  Fact Sheet for Healthcare Providers:  SeriousBroker.it  This test is no t yet approved or cleared by the [place] FDA and  has been authorized for detection and/or diagnosis of SARS-CoV-2 by FDA under an Emergency Use Authorization (EUA). This EUA will remain  in effect (meaning this test can be used) for the duration of the COVID-19 declaration under Section 564(b)(1) of the Act, 21 U.S.C.section 360bbb-3(b)(1), unless the authorization  is terminated  or revoked sooner.       Influenza A by PCR NEGATIVE NEGATIVE Final   Influenza B by PCR NEGATIVE NEGATIVE Final    Comment: (NOTE) The Xpert Xpress SARS-CoV-2/FLU/RSV plus assay is intended as an aid in the diagnosis of influenza from Nasopharyngeal swab specimens and should not be used as a sole basis for treatment. Nasal washings and aspirates are unacceptable for Xpert Xpress SARS-CoV-2/FLU/RSV testing.  Fact Sheet for Patients: BloggerCourse.com  Fact Sheet for Healthcare Providers: SeriousBroker.it  This test is not yet approved or cleared by the [place] FDA and has been authorized for detection and/or diagnosis of SARS-CoV-2 by FDA under an Emergency Use Authorization (EUA). This EUA will remain in effect (meaning this test can be used) for the duration of the COVID-19 declaration under Section 564(b)(1) of the Act, 21 U.S.C. section 360bbb-3(b)(1), unless the authorization is terminated or revoked.     Resp Syncytial Virus by PCR NEGATIVE NEGATIVE Final    Comment: (NOTE) Fact Sheet for Patients: BloggerCourse.com  Fact Sheet for Healthcare Providers: SeriousBroker.it  This test is not yet approved or cleared by the [place] FDA and has been authorized for detection and/or diagnosis of SARS-CoV-2 by FDA under an Emergency Use Authorization (EUA). This EUA will remain in effect (meaning this test can be used) for the duration of the COVID-19 declaration under Section 564(b)(1) of the Act, 21 U.S.C. section 360bbb-3(b)(1), unless the authorization is terminated or revoked.  Performed at [facility], [address] [place], [place] [address]     Labs: CBC: Recent Labs  Lab 12/13/22 [phone] 12/14/22 0358 12/15/22 0455 12/16/22 0445 12/17/22 0514  WBC 3.5* 4.1 3.6* 4.1 3.0*  HGB 16.0 14.6 14.4 14.1 14.6  HCT 49.6 44.7 44.4  43.5 45.0  MCV 95.8 93.3 94.9 94.6 94.3  PLT 245 220 207 224 234   Basic Metabolic Panel: Recent Labs  Lab 12/13/22 1130 12/13/22 2303 12/14/22 0358 12/15/22 0455  NA 139 138 137 136  K 4.4 3.6 3.9 4.2  CL 105 105 106 106  CO2 26 26 24  21*  GLUCOSE 97 100* 116* 101*  BUN 21* 20 18 15   CREATININE 1.48* 1.40* 1.32* 1.23  CALCIUM 8.4* 8.0* 8.1* 8.5*  MG  --  1.9  --  2.0  PHOS  --   --   --  3.5   Liver Function Tests: Recent Labs  Lab 12/14/22 0358  AST 18  ALT 16  ALKPHOS 48  BILITOT 1.2  PROT 7.0  ALBUMIN 3.4*   CBG: Recent Labs  Lab 12/16/22 0742 12/16/22 1207 12/16/22 1745 12/16/22 2006 12/17/22 0814  GLUCAP 87 89 99 101* 94  Discharge time spent: greater than 30 minutes.  Signed: Enedina Finner, MD [facility] 12/17/2022

## 2022-12-20 ENCOUNTER — Telehealth: Payer: Self-pay

## 2022-12-20 NOTE — Transitions of Care (Post Inpatient/ED Visit) (Signed)
   12/20/2022  Name: Ryan [name] MRN: [id number] DOB: 1971/06/02  Today's TOC FU Call Status: Today's TOC FU Call Status:: Successful TOC FU Call Competed TOC FU Call Complete Date: 12/20/22  Transition Care Management Follow-up Telephone Call Date of Discharge: 12/17/22 Discharge Facility: [facility]) Type of Discharge: Inpatient Admission Primary Inpatient Discharge Diagnosis:: shortness of breath How have you been since you were released from the hospital?: Better Any questions or concerns?: No  Items Reviewed: Did you receive and understand the discharge instructions provided?: Yes Medications obtained and verified?: Yes (Medications Reviewed) Any new allergies since your discharge?: No Dietary orders reviewed?: Yes  Home Care and Equipment/Supplies: Were Home Health Services Ordered?: NA Any new equipment or medical supplies ordered?: Yes Name of Medical supply agency?: [facility] Were you able to get the equipment/medical supplies?: Yes Do you have any questions related to the use of the equipment/supplies?: No  Functional Questionnaire: Do you need assistance with bathing/showering or dressing?: No Do you need assistance with meal preparation?: No Do you need assistance with eating?: No Do you have difficulty maintaining continence: No Do you need assistance with getting out of bed/getting out of a chair/moving?: No Do you have difficulty managing or taking your medications?: No  Follow up appointments reviewed: PCP Follow-up appointment confirmed?: Yes Date of PCP follow-up appointment?: 12/23/22 Follow-up Provider: [name] Specialist [facility] Follow-up appointment confirmed?: Yes Date of Specialist follow-up appointment?: 01/07/23 Follow-Up Specialty Provider:: Pulmo Do you need transportation to your follow-up appointment?: No Do you understand care options if your condition(s) worsen?: Yes-patient verbalized  understanding    SIGNATURE Karena Addison, LPN [facility] Nurse Health Advisor Direct Dial [phone]

## 2022-12-21 ENCOUNTER — Encounter: Payer: No Typology Code available for payment source | Admitting: Family

## 2022-12-23 ENCOUNTER — Ambulatory Visit: Payer: No Typology Code available for payment source | Admitting: Nurse Practitioner

## 2022-12-23 ENCOUNTER — Encounter: Payer: Self-pay | Admitting: Nurse Practitioner

## 2022-12-23 VITALS — BP 132/85 | HR 92 | Temp 97.9°F | Wt 354.0 lb

## 2022-12-23 DIAGNOSIS — N1831 Chronic kidney disease, stage 3a: Secondary | ICD-10-CM | POA: Diagnosis not present

## 2022-12-23 DIAGNOSIS — I10 Essential (primary) hypertension: Secondary | ICD-10-CM

## 2022-12-23 DIAGNOSIS — I5032 Chronic diastolic (congestive) heart failure: Secondary | ICD-10-CM

## 2022-12-23 DIAGNOSIS — Z09 Encounter for follow-up examination after completed treatment for conditions other than malignant neoplasm: Secondary | ICD-10-CM

## 2022-12-23 DIAGNOSIS — Z5181 Encounter for therapeutic drug level monitoring: Secondary | ICD-10-CM

## 2022-12-23 DIAGNOSIS — I2699 Other pulmonary embolism without acute cor pulmonale: Secondary | ICD-10-CM

## 2022-12-23 DIAGNOSIS — Z7901 Long term (current) use of anticoagulants: Secondary | ICD-10-CM

## 2022-12-23 LAB — COAGUCHEK XS/INR WAIVED
INR: 4.2 — ABNORMAL HIGH (ref 0.9–1.1)
Prothrombin Time: 50.4 s

## 2022-12-23 MED ORDER — WARFARIN SODIUM 5 MG PO TABS: 5.0000 mg | ORAL_TABLET | Freq: Every day | ORAL | 1 refills | Status: DC

## 2022-12-23 NOTE — Progress Notes (Signed)
Please let patient know that his INR was too high at 4.2.  He needs to skip today's dose and tomorrow's dose.  I sent in a prescription for  of coumadin.  He should start that on Saturday and come back on Monday to have his INR rechecked. He should be put on my schedule.

## 2022-12-23 NOTE — Progress Notes (Signed)
BP 132/85   Pulse 92   Temp 97.9 F (36.6 C) (Oral)   Wt (!) 354 lb (160.6 kg)   SpO2 92%   BMI 55.44 kg/m    Subjective:    Patient ID: Ryan [name], male    DOB: 03/25/1971, 52 y.o.   MRN: [id number]  HPI: Ryan [name] is a 53 y.o. male  Chief Complaint  Patient presents with   Hospitalization Follow-up   Transition of Care Hospital Follow up.   Hospital/Facility: [facility] D/C Physician: Dr. Allena Katz D/C Date: 12/13/22  Records Requested: NA Records Received: Yes Records Reviewed: Yes  Diagnoses on Discharge:   CTA chest showed acute on chronic PE with right main pulmonary thrombus superimposed on bulky chronic clot which is seen adherent to the vessel wall.  Patient was admitted for further evaluation.  Vascular surgery was consulted Patient underwent mechanical thrombectomy,  tolerated well.  Patient continued on heparin with the plan to bridge with Coumadin.    Acute hypoxic respiratory failure: Recurrent pulmonary embolism: --Patient presented with cough and increased work of breathing. --There was a clot that needed to be removed.  Patient is doing much better.  He has follow up scheduled with Dr. Wyn Quaker.  His O2 at home is running 94-97% without O2.  Has been switched from [facility] to Coumadin.  Will need INR checked at visit today.   CKD stage IIIa: Baseline serum creatinine remains around 1.5. Creatinine is at baseline. Avoid nephrotoxic medications   Type 2 diabetes with hyperglycemia: --Continue home medications --hemoglobin A1c feb 2024--6.6 --on Mounjaro at home   Hypertension: --BP stable. --Continue metoprolol   Chronic diastolic CHF: --2D echo August 2023 with EF 55 to 60% --Large body habitus in setting of morbid obesity --Does appear to be fairly euvolemic at present. --No cardiomegaly or pulmonary congestion on imaging --Continue Lasix and Entresto   Atrial flutter with rapid ventricular response S/p ablation in the past Heart rate is  controlled, EKG normal sinus rhythm. Continue metoprolol  Date of interactive Contact within 48 hours of discharge:  Contact was through: phone  Date of 7 day or 14 day face-to-face visit:    within 7 days  Outpatient Encounter Medications as of 12/23/2022  Medication Sig   albuterol (VENTOLIN HFA) 108 (90 Base) MCG/ACT inhaler Inhale 2 puffs into the lungs every 6 (six) hours as needed for wheezing or shortness of breath.   furosemide (LASIX) 40 MG tablet Take 40 mg by mouth every other day.   magnesium oxide (MAG-OX) 400 (240 Mg) MG tablet Take 400 mg by mouth daily.   metoprolol succinate (TOPROL-XL) 25 MG 24 hr tablet Take 1 tablet (25 mg total) by mouth daily.   OXYGEN Inhale 1 L into the lungs daily.   sacubitril-valsartan (ENTRESTO) 24-26 MG Take 1 tablet by mouth 2 (two) times daily.   tirzepatide [facility]) 5 MG/0.5ML Pen Inject 5 mg into the skin once a week.   warfarin (COUMADIN) 5 MG tablet Take 1 tablet (5 mg total) by mouth daily.   [DISCONTINUED] warfarin (COUMADIN) 10 MG tablet Take 1 tablet (10 mg total) by mouth daily at 4 PM.   No facility-administered encounter medications on file as of 12/23/2022.    Diagnostic Tests Reviewed/Disposition: Reviewed  Consults:Cardiology, Vascular  Discharge Instructions: Reviewed with patient during visit.  Disease/illness Education: Provided during visit  Home Health/Community Services Discussions/Referrals: Discussed Cardiology referral during visit.  Establishment or re-establishment of referral orders for community resources: NA  Discussion with  other health care providers: Reviewed consult notes from the hospital  Assessment and Support of treatment regimen adherence: Provided during visit  Appointments Coordinated with: Patient  Education for self-management, independent living, and ADLs: Provided during visit  Relevant past medical, surgical, family and social history reviewed and updated as indicated. Interim  medical history since our last visit reviewed. Allergies and medications reviewed and updated.  Review of Systems  Eyes:  Negative for visual disturbance.  Respiratory:  Negative for shortness of breath.   Cardiovascular:  Negative for chest pain and leg swelling.  Neurological:  Negative for light-headedness and headaches.    Per HPI unless specifically indicated above     Objective:    BP 132/85   Pulse 92   Temp 97.9 F (36.6 C) (Oral)   Wt (!) 354 lb (160.6 kg)   SpO2 92%   BMI 55.44 kg/m   Wt Readings from Last 3 Encounters:  12/23/22 (!) 354 lb (160.6 kg)  12/13/22 (!) 364 lb 13.8 oz (165.5 kg)  10/28/22 (!) 366 lb (166 kg)    Physical Exam Vitals and nursing note reviewed.  Constitutional:      General: He is not in acute distress.    Appearance: Normal appearance. He is obese. He is not ill-appearing, toxic-appearing or diaphoretic.  HENT:     Head: Normocephalic.     Right Ear: External ear normal.     Left Ear: External ear normal.     Nose: Nose normal. No congestion or rhinorrhea.     Mouth/Throat:     Mouth: Mucous membranes are moist.  Eyes:     General:        Right eye: No discharge.        Left eye: No discharge.     Extraocular Movements: Extraocular movements intact.     Conjunctiva/sclera: Conjunctivae normal.     Pupils: Pupils are equal, round, and reactive to light.  Cardiovascular:     Rate and Rhythm: Normal rate and regular rhythm.     Heart sounds: No murmur heard. Pulmonary:     Effort: Pulmonary effort is normal. No respiratory distress.     Breath sounds: Normal breath sounds. No wheezing, rhonchi or rales.  Abdominal:     General: Abdomen is flat. Bowel sounds are normal.  Musculoskeletal:     Cervical back: Normal range of motion and neck supple.     Right lower leg: Edema present.     Left lower leg: Edema present.  Skin:    General: Skin is warm and dry.     Capillary Refill: Capillary refill takes less than 2 seconds.        Neurological:     General: No focal deficit present.     Mental Status: He is alert and oriented to person, place, and time.  Psychiatric:        Mood and Affect: Mood normal.        Behavior: Behavior normal.        Thought Content: Thought content normal.        Judgment: Judgment normal.     Results for orders placed or performed in visit on 12/23/22  Comp Met (CMET)  Result Value Ref Range   Glucose 90 70 - 99 mg/dL   BUN 14 6 - 24 mg/dL   Creatinine, Ser 1.61 (H) 0.76 - 1.27 mg/dL   eGFR 56 (L) >09 UE/AVW/0.98   BUN/Creatinine Ratio 9 9 - 20   Sodium 140  134 - 144 mmol/L   Potassium 4.8 3.5 - 5.2 mmol/L   Chloride 107 (H) 96 - 106 mmol/L   CO2 17 (L) 20 - 29 mmol/L   Calcium 8.9 8.7 - 10.2 mg/dL   Total Protein 7.8 6.0 - 8.5 g/dL   Albumin 4.2 3.8 - 4.9 g/dL   Globulin, Total 3.6 1.5 - 4.5 g/dL   Albumin/Globulin Ratio 1.2 1.2 - 2.2   Bilirubin Total 0.4 0.0 - 1.2 mg/dL   Alkaline Phosphatase 71 44 - 121 IU/L   AST 20 0 - 40 IU/L   ALT 28 0 - 44 IU/L  CBC w/Diff  Result Value Ref Range   WBC WILL FOLLOW    RBC WILL FOLLOW    Hemoglobin WILL FOLLOW    Hematocrit WILL FOLLOW    MCV WILL FOLLOW    MCH WILL FOLLOW    MCHC WILL FOLLOW    RDW WILL FOLLOW    Platelets WILL FOLLOW    Neutrophils WILL FOLLOW    Lymphs WILL FOLLOW    Monocytes WILL FOLLOW    Eos WILL FOLLOW    Basos WILL FOLLOW    Immature Cells WILL FOLLOW    Neutrophils Absolute WILL FOLLOW    Lymphocytes Absolute WILL FOLLOW    Monocytes Absolute WILL FOLLOW    EOS (ABSOLUTE) WILL FOLLOW    Basophils Absolute WILL FOLLOW    Immature Granulocytes WILL FOLLOW    Immature Grans (Abs) WILL FOLLOW    NRBC WILL FOLLOW    Hematology Comments: WILL FOLLOW   CoaguChek XS/INR Waived (STAT)  Result Value Ref Range   INR 4.2 (H) 0.9 - 1.1   Prothrombin Time 50.4 sec      Assessment & Plan:   Problem List Items Addressed This Visit       Cardiovascular and Mediastinum   Chronic  diastolic CHF (congestive heart failure) (HCC)    Chronic.  Has been seeing Heart Failure clinic but his insurance doesn't cover it.  Will get him established with Cardiology for outpatient follow up.      Relevant Medications   warfarin (COUMADIN) 5 MG tablet   Other Relevant Orders   Ambulatory referral to Cardiology   Pulmonary emboli (HCC)    Chronic. Continue with Coumadin.  Recommend patient be seen by Hematology due to question on whether he can return to [facility] or needs to continue on Coumadin.  INR checked at visit today.  New referral placed.      Relevant Medications   warfarin (COUMADIN) 5 MG tablet     Other   Anticoagulation goal of INR 2 to 3    Has been switched to Coumadin.  Currently on Coumadin 10mg  daily.  INR in office today was 4.2.  Will hold Coumadin for 2 days and restart 5mg  of coumadin on Saturday.  Return on Monday for repeat INR.  New referral placed for hematology.       Relevant Orders   CoaguChek XS/INR Waived (STAT) (Completed)   Ambulatory referral to Hematology / Oncology   Other Visit Diagnoses     Hospital discharge follow-up    -  Primary   Labs checked at visit today.  New referral placed for Hematology and Cardiology.   Relevant Orders   Comp Met (CMET) (Completed)   CBC w/Diff (Completed)        Follow up plan: Return in about 1 month (around 01/22/2023) for Coag check.

## 2022-12-24 DIAGNOSIS — Z5181 Encounter for therapeutic drug level monitoring: Secondary | ICD-10-CM | POA: Insufficient documentation

## 2022-12-24 LAB — CBC WITH DIFFERENTIAL/PLATELET
Basophils Absolute: 0 10*3/uL (ref 0.0–0.2)
Basos: 1 %
EOS (ABSOLUTE): 0.1 10*3/uL (ref 0.0–0.4)
Eos: 5 %
Hematocrit: 47.3 % (ref 37.5–51.0)
Hemoglobin: 15.8 g/dL (ref 13.0–17.7)
Immature Grans (Abs): 0 10*3/uL (ref 0.0–0.1)
Immature Granulocytes: 0 %
Lymphocytes Absolute: 1.4 10*3/uL (ref 0.7–3.1)
Lymphs: 50 %
MCH: 30.8 pg (ref 26.6–33.0)
MCHC: 33.4 g/dL (ref 31.5–35.7)
MCV: 92 fL (ref 79–97)
Monocytes Absolute: 0.5 10*3/uL (ref 0.1–0.9)
Monocytes: 17 %
Neutrophils Absolute: 0.8 10*3/uL — ABNORMAL LOW (ref 1.4–7.0)
Neutrophils: 27 %
Platelets: 297 10*3/uL (ref 150–450)
RBC: 5.13 x10E6/uL (ref 4.14–5.80)
RDW: 13.2 % (ref 11.6–15.4)
WBC: 2.8 10*3/uL — ABNORMAL LOW (ref 3.4–10.8)

## 2022-12-24 LAB — COMPREHENSIVE METABOLIC PANEL
ALT: 28 IU/L (ref 0–44)
AST: 20 IU/L (ref 0–40)
Albumin/Globulin Ratio: 1.2 (ref 1.2–2.2)
Albumin: 4.2 g/dL (ref 3.8–4.9)
Alkaline Phosphatase: 71 IU/L (ref 44–121)
BUN/Creatinine Ratio: 9 (ref 9–20)
BUN: 14 mg/dL (ref 6–24)
Bilirubin Total: 0.4 mg/dL (ref 0.0–1.2)
CO2: 17 mmol/L — ABNORMAL LOW (ref 20–29)
Calcium: 8.9 mg/dL (ref 8.7–10.2)
Chloride: 107 mmol/L — ABNORMAL HIGH (ref 96–106)
Creatinine, Ser: 1.48 mg/dL — ABNORMAL HIGH (ref 0.76–1.27)
Globulin, Total: 3.6 g/dL (ref 1.5–4.5)
Glucose: 90 mg/dL (ref 70–99)
Potassium: 4.8 mmol/L (ref 3.5–5.2)
Sodium: 140 mmol/L (ref 134–144)
Total Protein: 7.8 g/dL (ref 6.0–8.5)
eGFR: 56 mL/min/{1.73_m2} — ABNORMAL LOW (ref >59)

## 2022-12-24 NOTE — Assessment & Plan Note (Signed)
Chronic.  Has been seeing Heart Failure clinic but his insurance doesn't cover it.  Will get him established with Cardiology for outpatient follow up.

## 2022-12-24 NOTE — Assessment & Plan Note (Signed)
Chronic. Continue with Coumadin.  Recommend patient be seen by Hematology due to question on whether he can return to [facility] or needs to continue on Coumadin.  INR checked at visit today.  New referral placed.

## 2022-12-24 NOTE — Assessment & Plan Note (Signed)
Has been switched to Coumadin.  Currently on Coumadin 10mg  daily.  INR in office today was 4.2.  Will hold Coumadin for 2 days and restart 5mg  of coumadin on Saturday.  Return on Monday for repeat INR.  New referral placed for hematology.

## 2022-12-27 ENCOUNTER — Ambulatory Visit (INDEPENDENT_AMBULATORY_CARE_PROVIDER_SITE_OTHER): Payer: No Typology Code available for payment source | Admitting: Nurse Practitioner

## 2022-12-27 ENCOUNTER — Encounter: Payer: Self-pay | Admitting: Nurse Practitioner

## 2022-12-27 VITALS — BP 116/65 | HR 80 | Temp 98.5°F

## 2022-12-27 DIAGNOSIS — N1831 Chronic kidney disease, stage 3a: Secondary | ICD-10-CM | POA: Diagnosis not present

## 2022-12-27 DIAGNOSIS — I2699 Other pulmonary embolism without acute cor pulmonale: Secondary | ICD-10-CM | POA: Diagnosis not present

## 2022-12-27 DIAGNOSIS — Z7901 Long term (current) use of anticoagulants: Secondary | ICD-10-CM | POA: Diagnosis not present

## 2022-12-27 DIAGNOSIS — Z5181 Encounter for therapeutic drug level monitoring: Secondary | ICD-10-CM | POA: Diagnosis not present

## 2022-12-27 LAB — COAGUCHEK XS/INR WAIVED
INR: 1.7 — ABNORMAL HIGH (ref 0.9–1.1)
Prothrombin Time: 19.8 s

## 2022-12-27 NOTE — Progress Notes (Signed)
BP 116/65   Pulse 80   Temp 98.5 F (36.9 C) (Oral)   SpO2 91%    Subjective:    Patient ID: Ryan [name], male    DOB: 09-05-1970, 52 y.o.   MRN: [id number]  HPI: Ryan [name] is a 52 y.o. male  Chief Complaint  Patient presents with   Atrial Fibrillation    PT/INR follow up   Coumadin Management.  The expected duration of coumadin treatment is lifelong The reason for anticoagulation is   Chronic PE .  INR today was 1.7.   Present Coumadin dose: Goal: 2.0-3.0 The patient does not have an active anticoagulation episode. Excessive bruising: no Nose bleeding: no Rectal bleeding: no Prolonged menstrual cycles: N/A Eating diet with consistent amounts of foods containing Vitamin K:no Any recent antibiotic use? no  Relevant past medical, surgical, family and social history reviewed and updated as indicated. Interim medical history since our last visit reviewed. Allergies and medications reviewed and updated.  Review of Systems  All other systems reviewed and are negative.   Per HPI unless specifically indicated above     Objective:    BP 116/65   Pulse 80   Temp 98.5 F (36.9 C) (Oral)   SpO2 91%   Wt Readings from Last 3 Encounters:  12/23/22 (!) 354 lb (160.6 kg)  12/13/22 (!) 364 lb 13.8 oz (165.5 kg)  10/28/22 (!) 366 lb (166 kg)    Physical Exam Vitals and nursing note reviewed.  Constitutional:      General: He is not in acute distress.    Appearance: Normal appearance. He is obese. He is not ill-appearing, toxic-appearing or diaphoretic.  HENT:     Head: Normocephalic.     Right Ear: External ear normal.     Left Ear: External ear normal.     Nose: Nose normal. No congestion or rhinorrhea.     Mouth/Throat:     Mouth: Mucous membranes are moist.  Eyes:     General:        Right eye: No discharge.        Left eye: No discharge.     Extraocular Movements: Extraocular movements intact.     Conjunctiva/sclera: Conjunctivae normal.     Pupils:  Pupils are equal, round, and reactive to light.  Cardiovascular:     Rate and Rhythm: Normal rate and regular rhythm.     Heart sounds: No murmur heard. Pulmonary:     Effort: Pulmonary effort is normal. No respiratory distress.     Breath sounds: Normal breath sounds. No wheezing, rhonchi or rales.  Abdominal:     General: Abdomen is flat. Bowel sounds are normal.  Musculoskeletal:     Cervical back: Normal range of motion and neck supple.  Skin:    General: Skin is warm and dry.     Capillary Refill: Capillary refill takes less than 2 seconds.  Neurological:     General: No focal deficit present.     Mental Status: He is alert and oriented to person, place, and time.  Psychiatric:        Mood and Affect: Mood normal.        Behavior: Behavior normal.        Thought Content: Thought content normal.        Judgment: Judgment normal.     Results for orders placed or performed in visit on 12/23/22  Comp Met (CMET)  Result Value Ref Range   Glucose 90  70 - 99 mg/dL   BUN 14 6 - 24 mg/dL   Creatinine, Ser 0.98 (H) 0.76 - 1.27 mg/dL   eGFR 56 (L) >11 BJ/YNW/2.95   BUN/Creatinine Ratio 9 9 - 20   Sodium 140 134 - 144 mmol/L   Potassium 4.8 3.5 - 5.2 mmol/L   Chloride 107 (H) 96 - 106 mmol/L   CO2 17 (L) 20 - 29 mmol/L   Calcium 8.9 8.7 - 10.2 mg/dL   Total Protein 7.8 6.0 - 8.5 g/dL   Albumin 4.2 3.8 - 4.9 g/dL   Globulin, Total 3.6 1.5 - 4.5 g/dL   Albumin/Globulin Ratio 1.2 1.2 - 2.2   Bilirubin Total 0.4 0.0 - 1.2 mg/dL   Alkaline Phosphatase 71 44 - 121 IU/L   AST 20 0 - 40 IU/L   ALT 28 0 - 44 IU/L  CBC w/Diff  Result Value Ref Range   WBC 2.8 (L) 3.4 - 10.8 x10E3/uL   RBC 5.13 4.14 - 5.80 x10E6/uL   Hemoglobin 15.8 13.0 - 17.7 g/dL   Hematocrit 62.1 30.8 - 51.0 %   MCV 92 79 - 97 fL   MCH 30.8 26.6 - 33.0 pg   MCHC 33.4 31.5 - 35.7 g/dL   RDW 65.7 84.6 - 96.2 %   Platelets 297 150 - 450 x10E3/uL   Neutrophils 27 Not Estab. %   Lymphs 50 Not Estab. %    Monocytes 17 Not Estab. %   Eos 5 Not Estab. %   Basos 1 Not Estab. %   Neutrophils Absolute 0.8 (L) 1.4 - 7.0 x10E3/uL   Lymphocytes Absolute 1.4 0.7 - 3.1 x10E3/uL   Monocytes Absolute 0.5 0.1 - 0.9 x10E3/uL   EOS (ABSOLUTE) 0.1 0.0 - 0.4 x10E3/uL   Basophils Absolute 0.0 0.0 - 0.2 x10E3/uL   Immature Granulocytes 0 Not Estab. %   Immature Grans (Abs) 0.0 0.0 - 0.1 x10E3/uL   Hematology Comments: Note:   CoaguChek XS/INR Waived (STAT)  Result Value Ref Range   INR 4.2 (H) 0.9 - 1.1   Prothrombin Time 50.4 sec      Assessment & Plan:   Problem List Items Addressed This Visit       Cardiovascular and Mediastinum   Pulmonary emboli (HCC)    Chronic.  New referral placed for patient to see Hematology.       Relevant Orders   Ambulatory referral to Hematology / Oncology     Genitourinary   CKD (chronic kidney disease), stage IIIa - Primary    CMP repeated at visit today.  Cr had bumped up to 1.48 post discharge. Will make recommendations based on lab results.       Relevant Orders   Comp Met (CMET)     Other   Anticoagulation goal of INR 2 to 3    Chronic. Lifelong anticoagulation. INR today was 1.7.  He was discharged on Coumain 10mg .  Skipped dose on Thursday and Friday and restarted Coumadin 5mg  on Saturday.  New referral placed for Hematology.  Follow up in 1 week for repeat INR.  May need to alternate 5mg  and 7.5mg .        Relevant Orders   CoaguChek XS/INR Waived (STAT)   Ambulatory referral to Hematology / Oncology     Follow up plan: Return in about 1 week (around 01/03/2023) for Coag check.

## 2022-12-27 NOTE — Assessment & Plan Note (Signed)
Chronic.  New referral placed for patient to see Hematology.

## 2022-12-27 NOTE — Progress Notes (Signed)
Hi Macon. It was nice to see you last week.  Your lab work looks good.  Your kidney function bumped back up a little bit.  We will recheck it at our visit today. No other concerns at this time. Continue with your current medication regimen.  Follow up as discussed.  Please let me know if you have any questions.

## 2022-12-27 NOTE — Progress Notes (Signed)
Results discussed with patient during visit.

## 2022-12-27 NOTE — Assessment & Plan Note (Signed)
CMP repeated at visit today.  Cr had bumped up to 1.48 post discharge. Will make recommendations based on lab results.

## 2022-12-27 NOTE — Assessment & Plan Note (Signed)
Chronic. Lifelong anticoagulation. INR today was 1.7.  He was discharged on Coumain 10mg .  Skipped dose on Thursday and Friday and restarted Coumadin 5mg  on Saturday.  New referral placed for Hematology.  Follow up in 1 week for repeat INR.  May need to alternate 5mg  and 7.5mg .

## 2022-12-28 ENCOUNTER — Telehealth: Payer: Self-pay

## 2022-12-28 LAB — COMPREHENSIVE METABOLIC PANEL
ALT: 27 IU/L (ref 0–44)
AST: 19 IU/L (ref 0–40)
Albumin/Globulin Ratio: 1.3 (ref 1.2–2.2)
Albumin: 4.2 g/dL (ref 3.8–4.9)
Alkaline Phosphatase: 69 IU/L (ref 44–121)
BUN/Creatinine Ratio: 11 (ref 9–20)
BUN: 16 mg/dL (ref 6–24)
Bilirubin Total: 0.5 mg/dL (ref 0.0–1.2)
CO2: 18 mmol/L — ABNORMAL LOW (ref 20–29)
Calcium: 9.2 mg/dL (ref 8.7–10.2)
Chloride: 105 mmol/L (ref 96–106)
Creatinine, Ser: 1.41 mg/dL — ABNORMAL HIGH (ref 0.76–1.27)
Globulin, Total: 3.2 g/dL (ref 1.5–4.5)
Glucose: 96 mg/dL (ref 70–99)
Potassium: 4.6 mmol/L (ref 3.5–5.2)
Sodium: 139 mmol/L (ref 134–144)
Total Protein: 7.4 g/dL (ref 6.0–8.5)
eGFR: 60 mL/min/{1.73_m2} (ref >59)

## 2022-12-28 NOTE — Progress Notes (Signed)
Hi Ryan [name]. It was good to see you yesterday.  Your kidney function is still up.  Make sure you are drinking plenty of water daily.  We will repeat this next week.

## 2022-12-28 NOTE — Telephone Encounter (Signed)
PA for [facility] initiated and submitted via Cover My Meds. Key: [id number]

## 2022-12-30 ENCOUNTER — Ambulatory Visit: Payer: No Typology Code available for payment source | Attending: Cardiology | Admitting: Cardiology

## 2022-12-30 VITALS — BP 122/90 | HR 74 | Ht 67.0 in | Wt 366.6 lb

## 2022-12-30 DIAGNOSIS — I8290 Acute embolism and thrombosis of unspecified vein: Secondary | ICD-10-CM

## 2022-12-30 DIAGNOSIS — I5032 Chronic diastolic (congestive) heart failure: Secondary | ICD-10-CM | POA: Diagnosis not present

## 2022-12-30 DIAGNOSIS — I480 Paroxysmal atrial fibrillation: Secondary | ICD-10-CM | POA: Diagnosis not present

## 2022-12-30 DIAGNOSIS — Z7901 Long term (current) use of anticoagulants: Secondary | ICD-10-CM

## 2022-12-30 DIAGNOSIS — Z5181 Encounter for therapeutic drug level monitoring: Secondary | ICD-10-CM

## 2022-12-30 DIAGNOSIS — I825Z3 Chronic embolism and thrombosis of unspecified deep veins of distal lower extremity, bilateral: Secondary | ICD-10-CM

## 2022-12-30 DIAGNOSIS — I10 Essential (primary) hypertension: Secondary | ICD-10-CM | POA: Diagnosis not present

## 2022-12-30 DIAGNOSIS — E785 Hyperlipidemia, unspecified: Secondary | ICD-10-CM

## 2022-12-30 DIAGNOSIS — E1169 Type 2 diabetes mellitus with other specified complication: Secondary | ICD-10-CM

## 2022-12-30 DIAGNOSIS — I493 Ventricular premature depolarization: Secondary | ICD-10-CM

## 2022-12-30 NOTE — Assessment & Plan Note (Signed)
Really need to be very careful to ensure that his INR does not get below 2.  He has had some recurrent blood clots and clearly has substrate for.

## 2022-12-30 NOTE — Assessment & Plan Note (Signed)
Now yet again having a recurrent PE just last week with EKOS thrombectomy.  Some of his dyspnea is probably related to recurrent PE.  Now back on warfarin with close control of INR.  I agree that he needs to be seen by hematology.  Echo was not checked this time around.  States he is pretty stable we will hold off on rechecking now.  If there is change in symptoms with the worse again, would reassess.  Otherwise we will continue with current plan.

## 2022-12-30 NOTE — Progress Notes (Signed)
Primary Care Provider: [name], NP [facility] Cardiologist: None Electrophysiologist: None [facility] Note: Chief Complaint  Patient presents with   New Patient (Initial Visit)    New patient to clinic today.  Currently being followed with heart failure clinic.  Last seen at [facility] cardiology in 10/2020.       ===================================  ASSESSMENT/PLAN   Problem List Items Addressed This Visit       Cardiology Problems   VTE (venous thromboembolism) (Chronic)    Now yet again having a recurrent PE just last week with EKOS thrombectomy.  Some of his dyspnea is probably related to recurrent PE.  Now back on warfarin with close control of INR.  I agree that he needs to be seen by hematology.  Echo was not checked this time around.  States he is pretty stable we will hold off on rechecking now.  If there is change in symptoms with the worse again, would reassess.  Otherwise we will continue with current plan.      PVC (premature ventricular contraction)    PVCs noted.  Is on beta-blocker but not able to tolerate high dose.      Hyperlipidemia associated with type 2 diabetes mellitus (HCC)    Followed by PCP.  Not on medication.  I have not seen recent labs.  Will hold off for now.  He is on Mounjaro -> with the hope that it will help lose weight.      Essential hypertension    BP pretty well-controlled on Entresto and Toprol.      Chronic diastolic CHF (congestive heart failure) (HCC) - Primary    He carries a diagnosis of diastolic heart failure, most recent echo did not comment on diastolic pressures only the there were they were indeterminate.  There does not appear to be significant left atrial dilation either.  He does have hypertension.  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  Plan: He is on Entresto and Toprol and blood pressure is pretty well-controlled except the diastolic pressure is little high.  Cannot really increase Toprol up that much because of current heart rate.  May consider switching to carvedilol.  He takes 40 mg furosemide daily and seems to be doing okay without it.  He tells me his edema is stable.  I have no basis to justify.  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  Has gone back and forth to DOAC and warfarin but is now back on warfarin after his most recent episode. Major concern is that there is probably thrombus on the IVC filter and the question is that this recent event happened from migrating thrombus from the filter.  Need to be careful blood pressure management as he has had some low blood sugars on.  Will defer to vascular. Continue on warfarin.        Other   Anticoagulation goal of INR 2 to 3    Really need to be very careful to ensure that his INR does not  get below 2.  He has had some recurrent blood clots and clearly has substrate for.      Other Visit Diagnoses     PAF (paroxysmal atrial fibrillation) (HCC)       Relevant Orders   EKG 12-Lead      ===================================  HPI:    Ryan [name] is a morbidly obese 52 y.o. male with history of chronic recurrent VTE-PE who is being seen today to establish Primary Cardiology Care at the request of [name], NP.  Recurrent VTE-PE=>  follows with [facility] hematology (Dr. Isaiah Serge) April 2016-complete occlusion of the Right CFV => treated with lytic therapy May 2018 large PE in the right main PA History of IVC filter x 2 PE February 2023 with EKOS mechanical thrombectomy at  [facility]. Apparently 2 IVC filter is in place.. => Considered warfarin failure.  Initially on Eliquis, converted to Xarelto because of weight issues. Apparently he had missed 8 days of Xarelto.  Ended up with submassive PE.  Significant clot burden superior to the suprarenal IVC filter evidence of right heart strain with McConnell sign on TEE. Readmitted to [facility] April 15-19 2024 for acute on chronic PE => again EKOS mechanical thrombectomy by vascular surgery => now back on warfarin. History of Atrial Fibrillation and Atrial Flutter-s/p Unsuccessful Attempted Ablation April 2022 Noted to be in coarse A-fib flutter at the time of his PE, but initially had A-fib while having COVID in 2020. DM-2 HTN-with?  Chronic HFpEF HLD CKD 3a   Ryan [name] was seen on November 12, 2021 at [facility] Cardiology-this was shortly after his recent PE with mechanical arthrectomy (EKOS) at [facility] for submassive PE.  Was feeling relatively well at that time.  Mild productive cough since getting out.  Hoping to get into workout program and lose weight.  There was a question about IVC filter removal. => Echo rechecked in August to reassess RV function.  At that time was on Xarelto.  For BP he was on amlodipine that was increased to 10 mg daily along with losartan, spironolactone and Toprol.  Noted to be intermittently in paroxysmal coarse A-fib/flutter-continued Xarelto and metoprolol.  Most recent CHF clinic visit on October 19, 2022-converted from losartan to [place].  Seen by [facility] 12/02/2022 with history of recent blood clots.  MTHFR gene mutation noted.  Has chronic anticoagulation but failed warfarin.  Now on Xarelto.  Noted dyspnea with bouts of coughing spells.  Marginal sats. ->  Recommended weight loss.  Restart O2 with activity.  Home sleep study versus in person sleep study recommended.  VQ scan ordered.  Right heart scan.  Recommend he goes back to cardiology.  Recent Hospitalizations:  [facility]  4/15-19/2024: Readmitted for PE acute on chronic.  Mechanical thrombectomy by vascular surgery.  On presentation was hypoxic requiring 6 L of oxygen.  Back on IV heparin to Coumadin.  Remains on Entresto and Lasix.  Also noted to be in atrial flutter with rapid response upon arrival.  Placed on beta-blocker.  Apparently had flutter ablation in the past.  Reviewed  CV studies:    The following studies were reviewed today: (if available, images/films reviewed: From Epic Chart or Care Everywhere) [facility] with EKOS thrombectomy- 09/2021  Echo 04/08/2022:: EF 55 to 60%.  No RWMA.  Mild reduced RV function with mild dilation.  Normal mitral valve.  Normal aortic valve.  TTE on 10/20/21:  Technically difficult study.  An ultrasound enhancing agent was used to improve the  visualization of the left ventricular cavity and endocardial borders.  The left ventricle is normal in size with probably normal wall thickness.  The left ventricular systolic function is normal, LVEF is visually estimated at 55-60%.  The right ventricle is moderately dilated in size, with moderately reduced systolic function.  There is at least moderate pulmonary hypertension.  The right atrium is mildly to moderately dilated in size.  TTE on 10/16/20: -The left ventricle is normal in size with normal wall thickness. -The left ventricular systolic function is mildly decreased, LVEF is visually estimated at 45-50%. -The mitral valve leaflets are mildly thickened with normal leaflet mobility. -The right ventricle is mildly dilated in size, with low normal systolic function. -The right atrium is mildly dilated  in size.   TTE on 10/01/20: -Technically difficult study due to chest wall/lung interference. -Echo contrast utilized to enhance endocardial border definition. -The left ventricle is normal in size with normal wall thickness. -The left ventricular systolic function is mildly decreased, LVEF is visually estimated at  45-50%. -The mitral valve leaflets are mildly thickened with normal leaflet mobility. -The aortic valve is trileaflet with mildly thickened leaflets with mildly reduced excursion. -The right ventricle is mildly dilated in size, with low normal systolic function. -The aorta at the sinuses of Valsalva and ascending aorta is mildly dilated   Interval History:   Ryan [name] presents here to [facility] to ostensibly establish cardiology care, not sure why he is here as opposed to [facility].  He seems to be doing pretty well having just been in the hospital.  A little bit tired and fatigued.  Some mild cough and dyspnea.  He does note his oxygen level went down to 89% walking into the clinic today but usually at home at rest it is over 94 to 95% most of the time.  He is says these hoping to have sleep study done in June to be evaluated for starting CPAP. He does not recall having any rapid irregular heartbeats palpitations.  He said that the first time he was noted to have A-fib was back when he was dealing with COVID back in 2021-2022.  In reviewing the chart, he actually did not undergo ablation but had DCCV only.  They were unable to to get to the right atrium in order to perform ablation due to the IVC filter.  He said that the most recent clot may very well of formed on top of the IVC filter as opposed to being caught by the filter.  He actually says he is feeling better since switching from losartan to [place] and his blood pressure is improved and pretty much stable where they are right now.  The diastolic numbers here today are higher than he had in the past few weeks usually in the 60s and 70s.  He has not noticed any new significant PND or orthopnea.  He does have to sleep a little bit elevated because of his obesity and quite like OSA.  Usually at home he sleeps on his side and when he does that he does not get short of breath.  Swelling is off-and-on but not all that significant he just  had some baseline swelling from having chronic venous stasis from multiple DVTs.    Since his PE he has not any chest pain or pressure with rest or exertion.  He does get short of breath walking and doing things but he is quite deconditioned.  Thankfully, no recurrent A-fib symptoms.  No  syncope or near syncope.  No TIA or amaurosis fugax.  No claudication.  REVIEWED OF SYSTEMS   Review of Systems  Constitutional:  Positive for malaise/fatigue (Still worn out from his recent hospitalization/ED). Negative for weight loss (Not sure how he has weights that are 364 354 and 366 within 2 weeks of each other.).  HENT:  Negative for congestion.   Respiratory:  Positive for cough and shortness of breath (Only exertional).   Cardiovascular:  Positive for leg swelling. Negative for chest pain (No more since PE) and claudication.  Gastrointestinal:  Negative for blood in stool and melena.  Genitourinary:  Negative for hematuria.  Musculoskeletal:  Positive for joint pain. Negative for falls.  Neurological:  Positive for dizziness (Sometimes when he stands up too fast). Negative for focal weakness.  Endo/Heme/Allergies:  Does not bruise/bleed easily.  Psychiatric/Behavioral:  Negative for depression and memory loss. The patient is not nervous/anxious.     I have reviewed and (if needed) personally updated the patient's problem list, medications, allergies, past medical and surgical history, social and family history.   PAST MEDICAL HISTORY   Past Medical History:  Diagnosis Date   (HFpEF) heart failure with preserved ejection fraction (HCC)    Arrhythmia    atrial fibrillation   Diabetes mellitus type II, non insulin dependent (HCC)    Diabetes mellitus without complication (HCC)    per pt-pre diabetic-Dr Dario Guardian stated said pt   DVT (deep venous thrombosis) (HCC)    Over 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  Regular the workup has not been forthcoming) previously followed by Dr. Isaiah Serge at [facility]   Hepatic steatosis     Hypertension    Morbid obesity with BMI of 50.0-59.9, adult [facility])    Peripheral vascular disease (HCC)    Presence of IVC filter    Has 2 IVC filters with significant thrombus burden superior to the filter.   Pulmonary embolus (HCC) 12/2016   Initially just treated with anticoagulation, but in February 2023 in April 2024 treated with EKOS thrombectomy for submassive PE.   Ventricular trigeminy    Weakness of both lower limbs     PAST SURGICAL HISTORY   Past Surgical History:  Procedure Laterality Date   CARDIAC ELECTROPHYSIOLOGY STUDY AND ABLATION  10/2020   PERIPHERAL VASCULAR CATHETERIZATION N/A 01/10/2015   Procedure: Dialysis/Perma Catheter Insertion;  Surgeon: Renford Dills, MD;  Location: [facility] INVASIVE CV LAB;  Service: Cardiovascular;  Laterality: N/A;   PERIPHERAL VASCULAR CATHETERIZATION N/A 02/24/2015   Procedure: Dialysis/Perma Catheter Removal;  Surgeon: Annice Needy, MD;  Location: [facility] INVASIVE CV LAB;  Service: Cardiovascular;  Laterality: N/A;   PULMONARY THROMBECTOMY N/A 12/13/2022   Procedure: PULMONARY THROMBECTOMY;  Surgeon: Annice Needy, MD;  Location: [facility] INVASIVE CV LAB;  Service: Cardiovascular;  Laterality: N/A;   10/20/2021 [facility]) right heart cath with intervention-catheter thrombectomy (EKOS).    Atrial Fib & Flutter Ablation 11/2020 [facility] Dr. Filbert Berthold.  Admitting Diagnosis: Atrial flutter, unspecified type (CMS-HCC) [I48.92] Atrial fibrillation, unspecified type (CMS-HCC) [I48.91] PR COMPRE EP EVAL ABLTJ ATR FIB PULM VEIN ISOLATION [93656] (Afib Ablation/a-flutter ablation) PR ABLATE L/R ATRIAL FIBRIL W/ ISOLATED PULM VEIN [93657] PR INTRACARD ELECTROPHYS 3-DIMENS MAPPING [19147] PR CARDIOVERSION, ELECTIVE;EXTERN [92960] PR ELECTROPHYS EV,L A-V PACE/REC,W/ INDUCT [93621]  Ryan [name] is a 52 y.o.-year-old male with h/o atrial fibrillation who was admitted 12/01/2020 for atrial fibrillation ablation which was a failed attempt due to inability to  cross due to ivc filter in place so underwent DCCV  POST-PROCEDURE PLAN OF CARE: 1. Atrial fibrillation s/p DCCV: * Vital signs per protocol. * Monitor groin puncture sites for bleeding. * Bedrest x 2 hours. * Up ad lib after bedrest complete. * Maintain IV/Medlock per protocol. *Continue Toprol xl 200mg  daily and diltiazem 120mg  daily   MEDICATIONS/ALLERGIES   Current Meds  Medication Sig   albuterol (VENTOLIN HFA) 108 (90 Base) MCG/ACT inhaler Inhale 2 puffs into the lungs every 6 (six) hours as needed for wheezing or shortness of breath.   furosemide (LASIX) 40 MG tablet Take 40 mg by mouth every other day.   magnesium oxide (MAG-OX) 400 (240 Mg) MG tablet Take 400 mg by mouth daily.   metoprolol succinate (TOPROL-XL) 25 MG 24 hr tablet Take 1 tablet (25 mg total) by mouth daily.   OXYGEN Inhale 1 L into the lungs daily.   sacubitril-valsartan (ENTRESTO) 24-26 MG Take 1 tablet by mouth 2 (two) times daily.   tirzepatide [facility]) 5 MG/0.5ML Pen Inject 5 mg into the skin once a week.   warfarin (COUMADIN) 5 MG tablet Take 1 tablet (5 mg total) by mouth daily.    Allergies  Allergen Reactions   Metrizamide Other (See Comments)    Kidney failure requiring dialysis Kidney failure requiring dialysis    Clindamycin/Lincomycin Rash   Lincomycin Rash   Sulfa Antibiotics Rash    SOCIAL HISTORY/FAMILY HISTORY   Reviewed in Epic:   Social History   Tobacco Use   Smoking status: Never   Smokeless tobacco: Never  Vaping Use   Vaping Use: Never used  Substance Use Topics   Alcohol use: No   Drug use: No   Social History   Social History Narrative   Not on file   Family History  Problem Relation Age of Onset   Hypertension Mother    Cancer Mother    Breast cancer Mother    Prostate cancer Father    Hypertension Father    Diabetes Father    Pulmonary embolism Paternal Uncle    Heart disease Maternal Grandmother    Pulmonary embolism Paternal Grandfather       OBJCTIVE -PE, EKG, labs   Wt Readings from Last 3 Encounters:  12/30/22 (!) 366 lb 9.6 oz (166.3 kg)  12/23/22 (!) 354 lb (160.6 kg)  12/13/22 (!) 364 lb 13.8 oz (165.5 kg)    Physical Exam: BP (!) 122/90 (BP Location: Right Arm, Patient Position: Sitting, Cuff Size: Large)   Pulse 74   Ht 5\' 7"  (1.702 m)   Wt (!) 366 lb 9.6 oz (166.3 kg)   SpO2 (!) 89%   BMI 57.42 kg/m  Physical Exam Constitutional:      General: He is not in acute distress.    Appearance: Normal appearance. He is obese. He is not toxic-appearing.     Comments: Severe morbid obesity.  Well-groomed  HENT:     Head: Normocephalic and atraumatic.  Neck:     Vascular: JVD present. No carotid bruit or hepatojugular reflux.  Cardiovascular:     Rate and Rhythm: Normal rate and regular rhythm.     Chest Wall: PMI is not displaced.     Pulses: Decreased pulses (Unable to palpate because of thick bra the skin with pitting edema versus simply body habitus both ankles.).     Heart sounds: S1 normal. Heart sounds are distant. No murmur heard.    No friction rub. Gallop present. S4 sounds present.  Pulmonary:     Effort: Pulmonary effort is normal.  No respiratory distress.     Breath sounds: Normal breath sounds. No wheezing or rales.  Musculoskeletal:        General: Swelling (Nonpitting edema but also 1+ pitting edema.) and tenderness present.  Skin:    General: Skin is warm and dry.     Comments: Thick brawny skin in the extremities.  Neurological:     General: No focal deficit present.     Mental Status: He is alert and oriented to person, place, and time. Mental status is at baseline.     Gait: Gait normal.  Psychiatric:        Thought Content: Thought content normal.        Judgment: Judgment normal.   .  Adult ECG Report  Rate: 74 ;  Rhythm: normal sinus rhythm and nonspecific ST and T wave changes with T wave inversions in anterior leads, cannot exclude ischemia. ;   Narrative Interpretation:  Stable EKG  Recent Labs:  reviewed  Lab Results  Component Value Date   CHOL 168 10/15/2022   HDL 41 10/15/2022   LDLCALC 114 (H) 10/15/2022   TRIG 67 10/15/2022   CHOLHDL 4.1 10/15/2022   Lab Results  Component Value Date   CREATININE 1.41 (H) 12/27/2022   BUN 16 12/27/2022   NA 139 12/27/2022   K 4.6 12/27/2022   CL 105 12/27/2022   CO2 18 (L) 12/27/2022      Latest Ref Rng & Units 12/23/2022    9:53 AM 12/17/2022    5:14 AM 12/16/2022    4:45 AM  CBC  WBC 3.4 - 10.8 x10E3/uL 2.8  3.0  4.1   Hemoglobin 13.0 - 17.7 g/dL 16.1  09.6  04.5   Hematocrit 37.5 - 51.0 % 47.3  45.0  43.5   Platelets 150 - 450 x10E3/uL 297  234  224     Lab Results  Component Value Date   HGBA1C 6.6 (H) 10/15/2022   Lab Results  Component Value Date   TSH 1.810 03/18/2022    ================================================== I spent a total of 28 minutes with the patient spent in direct patient consultation.  Additional time spent with chart review  / charting (studies, outside notes, etc): 36 min Total Time: 64 min  Current medicines are reviewed at length with the patient today.  (+/- concerns) N/A  Notice: This dictation was prepared with Dragon dictation along with smart phrase technology. Any transcriptional errors that result from this process are unintentional and may not be corrected upon review.   Studies Ordered:  Orders Placed This Encounter  Procedures   EKG 12-Lead   No orders of the defined types were placed in this encounter.   Patient Instructions / Medication Changes & Studies & Tests Ordered   Patient Instructions  Medication Instructions:  No changes at this time.   *If you need a refill on your cardiac medications before your next appointment, please call your pharmacy*   Lab Work: None  If you have labs (blood work) drawn today and your tests are completely normal, you will receive your results only by: MyChart Message (if you have MyChart) OR A paper  copy in the mail If you have any lab test that is abnormal or we need to change your treatment, we will call you to review the results.   Testing/Procedures: None   Follow-Up: At [facility], you and your health needs are our priority.  As part of our continuing mission to provide you with exceptional  heart care, we have created designated Provider Care Teams.  These Care Teams include your primary Cardiologist (physician) and Advanced Practice Providers (APPs -  Physician Assistants and Nurse Practitioners) who all work together to provide you with the care you need, when you need it.    Your next appointment:   6 month(s)  Provider:   Bryan Lemma, MD       Marykay Lex, MD, MS Bryan Lemma, M.D., M.S. Interventional Cardiologist  [facility]   [address] [place], [place]  [address] [phone]           Fax [phone]    Thank you for choosing [facility] in [place]!!

## 2022-12-30 NOTE — Assessment & Plan Note (Signed)
He carries a diagnosis of diastolic heart failure, most recent echo did not comment on diastolic pressures only the there were they were indeterminate.  There does not appear to be significant left atrial dilation either.  He does have hypertension.  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  Plan: He is on Entresto and Toprol and blood pressure is pretty well-controlled except the diastolic pressure is little high.  Cannot really increase Toprol up that much because of current heart rate.  May consider switching to carvedilol.  He takes 40 mg furosemide daily and seems to be doing okay without it.  He tells me his edema is stable.  I have no basis to justify.  No change for now: Toprol 25 mg daily, Entresto 24 over 26 mg daily, Mounjaro injections, furosemide 40 mg every other day, Toprol 25 mg once a day

## 2022-12-30 NOTE — Assessment & Plan Note (Addendum)
Followed by PCP.  Not on medication.  I have not seen recent labs.  Will hold off for now.  He is on Mounjaro -> with the hope that it will help lose weight.

## 2022-12-30 NOTE — Assessment & Plan Note (Signed)
Chronic DVT/VTE has not been seen by hematology in the years.  Has gone back and forth to DOAC and warfarin but is now back on warfarin after his most recent episode. Major concern is that there is probably thrombus on the IVC filter and the question is that this recent event happened from migrating thrombus from the filter.  Need to be careful blood pressure management as he has had some low blood sugars on.  Will defer to vascular. Continue on warfarin.

## 2022-12-30 NOTE — Patient Instructions (Signed)
Medication Instructions:  No changes at this time.   *If you need a refill on your cardiac medications before your next appointment, please call your pharmacy*   Lab Work: None  If you have labs (blood work) drawn today and your tests are completely normal, you will receive your results only by: MyChart Message (if you have MyChart) OR A paper copy in the mail If you have any lab test that is abnormal or we need to change your treatment, we will call you to review the results.   Testing/Procedures: None   Follow-Up: At [facility], you and your health needs are our priority.  As part of our continuing mission to provide you with exceptional heart care, we have created designated Provider Care Teams.  These Care Teams include your primary Cardiologist (physician) and Advanced Practice Providers (APPs -  Physician Assistants and Nurse Practitioners) who all work together to provide you with the care you need, when you need it.    Your next appointment:   6 month(s)  Provider:   Bryan Lemma, MD

## 2022-12-30 NOTE — Assessment & Plan Note (Signed)
PVCs noted.  Is on beta-blocker but not able to tolerate high dose.

## 2022-12-30 NOTE — Assessment & Plan Note (Signed)
BP pretty well-controlled on Entresto and Toprol.

## 2023-01-03 ENCOUNTER — Encounter: Payer: Self-pay | Admitting: Nurse Practitioner

## 2023-01-03 ENCOUNTER — Ambulatory Visit (INDEPENDENT_AMBULATORY_CARE_PROVIDER_SITE_OTHER): Payer: No Typology Code available for payment source | Admitting: Nurse Practitioner

## 2023-01-03 VITALS — BP 127/88 | HR 65 | Temp 98.0°F | Wt 361.0 lb

## 2023-01-03 DIAGNOSIS — N1831 Chronic kidney disease, stage 3a: Secondary | ICD-10-CM | POA: Diagnosis not present

## 2023-01-03 DIAGNOSIS — I825Z3 Chronic embolism and thrombosis of unspecified deep veins of distal lower extremity, bilateral: Secondary | ICD-10-CM

## 2023-01-03 DIAGNOSIS — Z5181 Encounter for therapeutic drug level monitoring: Secondary | ICD-10-CM

## 2023-01-03 DIAGNOSIS — Z7901 Long term (current) use of anticoagulants: Secondary | ICD-10-CM

## 2023-01-03 LAB — COAGUCHEK XS/INR WAIVED
INR: 3.1 — ABNORMAL HIGH (ref 0.9–1.1)
Prothrombin Time: 37.2 s

## 2023-01-03 NOTE — Progress Notes (Signed)
BP 127/88   Pulse 65   Temp 98 F (36.7 C) (Oral)   Wt (!) 361 lb (163.7 kg)   SpO2 97%   BMI 56.54 kg/m    Subjective:    Patient ID: Ryan [name], male    DOB: 07/30/1971, 52 y.o.   MRN: [id number]  HPI: Ryan [name] is a 52 y.o. male  Chief Complaint  Patient presents with   Chronic Kidney Disease   Coumadin Management.  The expected duration of coumadin treatment is lifelong The reason for anticoagulation is   Chronic PE .  INR today was 3.1.   Present Coumadin dose: Goal: 2.0-3.0 The patient does not have an active anticoagulation episode. Excessive bruising: no Nose bleeding: no Rectal bleeding: no Prolonged menstrual cycles: N/A Eating diet with consistent amounts of foods containing Vitamin K:no Any recent antibiotic use? no  Relevant past medical, surgical, family and social history reviewed and updated as indicated. Interim medical history since our last visit reviewed. Allergies and medications reviewed and updated.  Review of Systems  All other systems reviewed and are negative.   Per HPI unless specifically indicated above     Objective:    BP 127/88   Pulse 65   Temp 98 F (36.7 C) (Oral)   Wt (!) 361 lb (163.7 kg)   SpO2 97%   BMI 56.54 kg/m   Wt Readings from Last 3 Encounters:  01/03/23 (!) 361 lb (163.7 kg)  12/30/22 (!) 366 lb 9.6 oz (166.3 kg)  12/23/22 (!) 354 lb (160.6 kg)    Physical Exam Vitals and nursing note reviewed.  Constitutional:      General: He is not in acute distress.    Appearance: Normal appearance. He is obese. He is not ill-appearing, toxic-appearing or diaphoretic.  HENT:     Head: Normocephalic.     Right Ear: External ear normal.     Left Ear: External ear normal.     Nose: Nose normal. No congestion or rhinorrhea.     Mouth/Throat:     Mouth: Mucous membranes are moist.  Eyes:     General:        Right eye: No discharge.        Left eye: No discharge.     Extraocular Movements: Extraocular  movements intact.     Conjunctiva/sclera: Conjunctivae normal.     Pupils: Pupils are equal, round, and reactive to light.  Cardiovascular:     Rate and Rhythm: Normal rate and regular rhythm.     Heart sounds: No murmur heard. Pulmonary:     Effort: Pulmonary effort is normal. No respiratory distress.     Breath sounds: Normal breath sounds. No wheezing, rhonchi or rales.  Abdominal:     General: Abdomen is flat. Bowel sounds are normal.  Musculoskeletal:     Cervical back: Normal range of motion and neck supple.  Skin:    General: Skin is warm and dry.     Capillary Refill: Capillary refill takes less than 2 seconds.  Neurological:     General: No focal deficit present.     Mental Status: He is alert and oriented to person, place, and time.  Psychiatric:        Mood and Affect: Mood normal.        Behavior: Behavior normal.        Thought Content: Thought content normal.        Judgment: Judgment normal.     Results for  orders placed or performed in visit on 12/27/22  CoaguChek XS/INR Waived (STAT)  Result Value Ref Range   INR 1.7 (H) 0.9 - 1.1   Prothrombin Time 19.8 sec  Comp Met (CMET)  Result Value Ref Range   Glucose 96 70 - 99 mg/dL   BUN 16 6 - 24 mg/dL   Creatinine, Ser 1.61 (H) 0.76 - 1.27 mg/dL   eGFR 60 >09 UE/AVW/0.98   BUN/Creatinine Ratio 11 9 - 20   Sodium 139 134 - 144 mmol/L   Potassium 4.6 3.5 - 5.2 mmol/L   Chloride 105 96 - 106 mmol/L   CO2 18 (L) 20 - 29 mmol/L   Calcium 9.2 8.7 - 10.2 mg/dL   Total Protein 7.4 6.0 - 8.5 g/dL   Albumin 4.2 3.8 - 4.9 g/dL   Globulin, Total 3.2 1.5 - 4.5 g/dL   Albumin/Globulin Ratio 1.3 1.2 - 2.2   Bilirubin Total 0.5 0.0 - 1.2 mg/dL   Alkaline Phosphatase 69 44 - 121 IU/L   AST 19 0 - 40 IU/L   ALT 27 0 - 44 IU/L      Assessment & Plan:   Problem List Items Addressed This Visit       Cardiovascular and Mediastinum   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 Lifelong anticoagulation. INR today was 3.1.  He was discharged on Coumain 10mg .  Does was decreased to 5mg  last week.  New referral placed for Hematology.  Follow up in 2 weeks for repeat INR.  May need to alternate 5mg  and 7.5mg .          Follow up plan: Return in about 2 weeks (around 01/17/2023) for Coag check.

## 2023-01-03 NOTE — Assessment & Plan Note (Signed)
Chronic. Lifelong anticoagulation. INR today was 3.1.  He was discharged on Coumain 10mg .  Does was decreased to 5mg  last week.  New referral placed for Hematology.  Follow up in 2 weeks for repeat INR.  May need to alternate 5mg  and 7.5mg .

## 2023-01-04 ENCOUNTER — Encounter: Payer: Self-pay | Admitting: Nurse Practitioner

## 2023-01-04 LAB — COMPREHENSIVE METABOLIC PANEL
ALT: 24 IU/L (ref 0–44)
AST: 22 IU/L (ref 0–40)
Albumin/Globulin Ratio: 1.2 (ref 1.2–2.2)
Albumin: 4 g/dL (ref 3.8–4.9)
Alkaline Phosphatase: 71 IU/L (ref 44–121)
BUN/Creatinine Ratio: 8 — ABNORMAL LOW (ref 9–20)
BUN: 11 mg/dL (ref 6–24)
Bilirubin Total: 0.6 mg/dL (ref 0.0–1.2)
CO2: 17 mmol/L — ABNORMAL LOW (ref 20–29)
Calcium: 8.6 mg/dL — ABNORMAL LOW (ref 8.7–10.2)
Chloride: 105 mmol/L (ref 96–106)
Creatinine, Ser: 1.43 mg/dL — ABNORMAL HIGH (ref 0.76–1.27)
Globulin, Total: 3.3 g/dL (ref 1.5–4.5)
Glucose: 94 mg/dL (ref 70–99)
Potassium: 4.9 mmol/L (ref 3.5–5.2)
Sodium: 138 mmol/L (ref 134–144)
Total Protein: 7.3 g/dL (ref 6.0–8.5)
eGFR: 59 mL/min/{1.73_m2} — ABNORMAL LOW (ref >59)

## 2023-01-04 NOTE — Progress Notes (Signed)
Hi Ryan [name]. Your Creatinine remains elevated.  Make sure you keep your follow ups with your nephrologist.  See you in two weeks.

## 2023-01-05 ENCOUNTER — Other Ambulatory Visit: Payer: Self-pay

## 2023-01-05 MED ORDER — TIRZEPATIDE 5 MG/0.5ML ~~LOC~~ SOAJ
5.0000 mg | SUBCUTANEOUS | 0 refills | Status: DC
  Filled 2023-01-05: qty 2, 28d supply, fill #0
  Filled 2023-03-29: qty 2, 28d supply, fill #1

## 2023-01-06 ENCOUNTER — Other Ambulatory Visit: Payer: Self-pay

## 2023-01-07 ENCOUNTER — Inpatient Hospital Stay: Payer: No Typology Code available for payment source | Admitting: Oncology

## 2023-01-07 NOTE — Assessment & Plan Note (Deleted)
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 Currently on Xarelto 20 mg daily. Previously hypercoagulable work-up was negative Otherwise no evidence of inherited thrombophilia His risk factors including morbid obesity, sedentary lifestyle etc.

## 2023-01-11 ENCOUNTER — Other Ambulatory Visit (HOSPITAL_BASED_OUTPATIENT_CLINIC_OR_DEPARTMENT_OTHER): Payer: Self-pay

## 2023-01-11 ENCOUNTER — Telehealth: Payer: Self-pay | Admitting: Nurse Practitioner

## 2023-01-11 NOTE — Telephone Encounter (Signed)
I do not see any short term disability paperwork.

## 2023-01-11 NOTE — Telephone Encounter (Signed)
Pt is calling in to provide a fax number for his paperwork to sent to. Fax number is [phone]

## 2023-01-11 NOTE — Telephone Encounter (Unsigned)
Copied from CRM [id number]. Topic: General - Inquiry >> Jan 11, 2023  9:54 AM Ryan [name] wrote: Pt called saying his short term disability ends tomorrow but he has an appt on Friday.  He wants to know if an extension can be sent in for this week.

## 2023-01-11 NOTE — Telephone Encounter (Signed)
Routing to provider to advise and review. Upon chart review, it looks like patient has FMLA through November of this year. I don't see anything regarding short term disability. Can you review the chart and make sure?

## 2023-01-12 ENCOUNTER — Telehealth: Payer: Self-pay | Admitting: Nurse Practitioner

## 2023-01-12 NOTE — Telephone Encounter (Signed)
See other phone encounter.  

## 2023-01-12 NOTE — Telephone Encounter (Signed)
Copied from CRM [id number]. Topic: General - Other >> Jan 12, 2023  1:22 PM Ja-Kwan M wrote: Reason for CRM: Pt would like to extend his short term disability until 01/19/23 after he has his appt with [name].

## 2023-01-12 NOTE — Telephone Encounter (Signed)
Copied from CRM [id number]. Topic: General - Other >> Jan 12, 2023  1:22 PM Ja-Kwan M wrote: Reason for CRM: Pt would like to extend his short term disability until 01/19/23 after he has his appt with [name].     Ok to write letter for this date instead?

## 2023-01-12 NOTE — Telephone Encounter (Signed)
Called and spoke to the patient. He states that he filed for short term disability when he was in the hospital. Patient states he just needs some kind of letter or note sent in keeping him out of work until Monday. He states that he sees pulmonology later this week and wants to get his breathing test done before he goes back to work.

## 2023-01-12 NOTE — Telephone Encounter (Signed)
Okay to write letter 

## 2023-01-13 ENCOUNTER — Encounter: Payer: Self-pay | Admitting: Nurse Practitioner

## 2023-01-13 ENCOUNTER — Ambulatory Visit: Payer: No Typology Code available for payment source | Admitting: Nurse Practitioner

## 2023-01-13 NOTE — Telephone Encounter (Signed)
Letter written and faxed as requested by the patient.   Called and notified him that this was completed as requested.

## 2023-01-16 ENCOUNTER — Encounter: Payer: Self-pay | Admitting: Nurse Practitioner

## 2023-01-17 ENCOUNTER — Encounter: Payer: Self-pay | Admitting: Nurse Practitioner

## 2023-01-18 ENCOUNTER — Encounter: Payer: Self-pay | Admitting: Nurse Practitioner

## 2023-01-18 ENCOUNTER — Ambulatory Visit (INDEPENDENT_AMBULATORY_CARE_PROVIDER_SITE_OTHER): Payer: No Typology Code available for payment source | Admitting: Nurse Practitioner

## 2023-01-18 VITALS — BP 132/84 | HR 71 | Temp 97.7°F | Ht 67.01 in | Wt 357.6 lb

## 2023-01-18 DIAGNOSIS — D689 Coagulation defect, unspecified: Secondary | ICD-10-CM

## 2023-01-18 LAB — COAGUCHEK XS/INR WAIVED
INR: 2 — ABNORMAL HIGH (ref 0.9–1.1)
Prothrombin Time: 23.8 s

## 2023-01-18 NOTE — Progress Notes (Signed)
BP 132/84   Pulse 71   Temp 97.7 F (36.5 C) (Oral)   Ht 5' 7.01" (1.702 m)   Wt (!) 357 lb 9.6 oz (162.2 kg)   SpO2 90%   BMI 55.99 kg/m    Subjective:    Patient ID: Ryan [name], male    DOB: 05-Jan-1971, 52 y.o.   MRN: [id number]  HPI: Ryan [name] is a 52 y.o. male  Chief Complaint  Patient presents with   INR Check   Coumadin Management.  The expected duration of coumadin treatment is lifelong The reason for anticoagulation is   Chronic PE . He has an appt with Hematology tomorrow.  INR today was 2.0.  Present Coumadin dose: Goal: 2.0-3.0 The patient does not have an active anticoagulation episode. Excessive bruising: no Nose bleeding: no Rectal bleeding: no Prolonged menstrual cycles: N/A Eating diet with consistent amounts of foods containing Vitamin K:no Any recent antibiotic use? no  Relevant past medical, surgical, family and social history reviewed and updated as indicated. Interim medical history since our last visit reviewed. Allergies and medications reviewed and updated.  Review of Systems  All other systems reviewed and are negative.   Per HPI unless specifically indicated above     Objective:    BP 132/84   Pulse 71   Temp 97.7 F (36.5 C) (Oral)   Ht 5' 7.01" (1.702 m)   Wt (!) 357 lb 9.6 oz (162.2 kg)   SpO2 90%   BMI 55.99 kg/m   Wt Readings from Last 3 Encounters:  01/18/23 (!) 357 lb 9.6 oz (162.2 kg)  01/03/23 (!) 361 lb (163.7 kg)  12/30/22 (!) 366 lb 9.6 oz (166.3 kg)    Physical Exam Vitals and nursing note reviewed.  Constitutional:      General: He is not in acute distress.    Appearance: Normal appearance. He is obese. He is not ill-appearing, toxic-appearing or diaphoretic.  HENT:     Head: Normocephalic.     Right Ear: External ear normal.     Left Ear: External ear normal.     Nose: Nose normal. No congestion or rhinorrhea.     Mouth/Throat:     Mouth: Mucous membranes are moist.  Eyes:     General:         Right eye: No discharge.        Left eye: No discharge.     Extraocular Movements: Extraocular movements intact.     Conjunctiva/sclera: Conjunctivae normal.     Pupils: Pupils are equal, round, and reactive to light.  Cardiovascular:     Rate and Rhythm: Normal rate and regular rhythm.     Heart sounds: No murmur heard. Pulmonary:     Effort: Pulmonary effort is normal. No respiratory distress.     Breath sounds: Normal breath sounds. No wheezing, rhonchi or rales.  Abdominal:     General: Abdomen is flat. Bowel sounds are normal.  Musculoskeletal:     Cervical back: Normal range of motion and neck supple.  Skin:    General: Skin is warm and dry.     Capillary Refill: Capillary refill takes less than 2 seconds.  Neurological:     General: No focal deficit present.     Mental Status: He is alert and oriented to person, place, and time.  Psychiatric:        Mood and Affect: Mood normal.        Behavior: Behavior normal.  Thought Content: Thought content normal.        Judgment: Judgment normal.     Results for orders placed or performed in visit on 01/03/23  CoaguChek XS/INR Waived (STAT)  Result Value Ref Range   INR 3.1 (H) 0.9 - 1.1   Prothrombin Time 37.2 sec  Comp Met (CMET)  Result Value Ref Range   Glucose 94 70 - 99 mg/dL   BUN 11 6 - 24 mg/dL   Creatinine, Ser 1.61 (H) 0.76 - 1.27 mg/dL   eGFR 59 (L) >09 UE/AVW/0.98   BUN/Creatinine Ratio 8 (L) 9 - 20   Sodium 138 134 - 144 mmol/L   Potassium 4.9 3.5 - 5.2 mmol/L   Chloride 105 96 - 106 mmol/L   CO2 17 (L) 20 - 29 mmol/L   Calcium 8.6 (L) 8.7 - 10.2 mg/dL   Total Protein 7.3 6.0 - 8.5 g/dL   Albumin 4.0 3.8 - 4.9 g/dL   Globulin, Total 3.3 1.5 - 4.5 g/dL   Albumin/Globulin Ratio 1.2 1.2 - 2.2   Bilirubin Total 0.6 0.0 - 1.2 mg/dL   Alkaline Phosphatase 71 44 - 121 IU/L   AST 22 0 - 40 IU/L   ALT 24 0 - 44 IU/L      Assessment & Plan:   Problem List Items Addressed This Visit        Hematopoietic and Hemostatic   Clotting disorder (HCC) - Primary    Chronic.  Ongoing concern.  Has IVC filter placed.  On coumadin.  INR was 2.0 today.  Has an appointment with Hematology tomorrow.  Continue with current medication regimen. Follow up in 1 month.  Call sooner if concerns arise.       Relevant Orders   CoaguChek XS/INR Waived (STAT)     Follow up plan: Return in about 1 month (around 02/18/2023) for Coag check.

## 2023-01-18 NOTE — Telephone Encounter (Signed)
Pt called asking if the letter or FMLA paperwork that he has been told was ready could be faxed to his FMLA person.  CB#  [phone]

## 2023-01-18 NOTE — Assessment & Plan Note (Signed)
Chronic.  Ongoing concern.  Has IVC filter placed.  On coumadin.  INR was 2.0 today.  Has an appointment with Hematology tomorrow.  Continue with current medication regimen. Follow up in 1 month.  Call sooner if concerns arise.

## 2023-01-19 ENCOUNTER — Encounter: Payer: Self-pay | Admitting: Oncology

## 2023-01-19 ENCOUNTER — Inpatient Hospital Stay: Payer: No Typology Code available for payment source | Attending: Oncology | Admitting: Oncology

## 2023-01-19 ENCOUNTER — Inpatient Hospital Stay: Payer: No Typology Code available for payment source

## 2023-01-19 VITALS — BP 133/86 | HR 77 | Temp 97.8°F | Resp 18 | Wt 355.8 lb

## 2023-01-19 DIAGNOSIS — Z7901 Long term (current) use of anticoagulants: Secondary | ICD-10-CM | POA: Insufficient documentation

## 2023-01-19 DIAGNOSIS — I2782 Chronic pulmonary embolism: Secondary | ICD-10-CM | POA: Diagnosis present

## 2023-01-19 DIAGNOSIS — I82503 Chronic embolism and thrombosis of unspecified deep veins of lower extremity, bilateral: Secondary | ICD-10-CM | POA: Diagnosis not present

## 2023-01-19 DIAGNOSIS — I2699 Other pulmonary embolism without acute cor pulmonale: Secondary | ICD-10-CM

## 2023-01-19 DIAGNOSIS — D709 Neutropenia, unspecified: Secondary | ICD-10-CM | POA: Diagnosis not present

## 2023-01-19 DIAGNOSIS — I825Z3 Chronic embolism and thrombosis of unspecified deep veins of distal lower extremity, bilateral: Secondary | ICD-10-CM | POA: Diagnosis not present

## 2023-01-19 DIAGNOSIS — K760 Fatty (change of) liver, not elsewhere classified: Secondary | ICD-10-CM | POA: Insufficient documentation

## 2023-01-19 DIAGNOSIS — Z6841 Body Mass Index (BMI) 40.0 and over, adult: Secondary | ICD-10-CM | POA: Diagnosis not present

## 2023-01-19 DIAGNOSIS — R591 Generalized enlarged lymph nodes: Secondary | ICD-10-CM

## 2023-01-19 DIAGNOSIS — Z8042 Family history of malignant neoplasm of prostate: Secondary | ICD-10-CM | POA: Diagnosis not present

## 2023-01-19 DIAGNOSIS — D708 Other neutropenia: Secondary | ICD-10-CM | POA: Diagnosis not present

## 2023-01-19 DIAGNOSIS — Z803 Family history of malignant neoplasm of breast: Secondary | ICD-10-CM | POA: Insufficient documentation

## 2023-01-19 LAB — VITAMIN B12: Vitamin B-12: 261 pg/mL (ref 180–914)

## 2023-01-19 LAB — CBC WITH DIFFERENTIAL/PLATELET
Abs Immature Granulocytes: 0 10*3/uL (ref 0.00–0.07)
Basophils Absolute: 0 10*3/uL (ref 0.0–0.1)
Basophils Relative: 0 %
Eosinophils Absolute: 0.1 10*3/uL (ref 0.0–0.5)
Eosinophils Relative: 2 %
HCT: 49.1 % (ref 39.0–52.0)
Hemoglobin: 16 g/dL (ref 13.0–17.0)
Immature Granulocytes: 0 %
Lymphocytes Relative: 44 %
Lymphs Abs: 1.5 10*3/uL (ref 0.7–4.0)
MCH: 30.6 pg (ref 26.0–34.0)
MCHC: 32.6 g/dL (ref 30.0–36.0)
MCV: 93.9 fL (ref 80.0–100.0)
Monocytes Absolute: 0.5 10*3/uL (ref 0.1–1.0)
Monocytes Relative: 14 %
Neutro Abs: 1.3 10*3/uL — ABNORMAL LOW (ref 1.7–7.7)
Neutrophils Relative %: 40 %
Platelets: 248 10*3/uL (ref 150–400)
RBC: 5.23 MIL/uL (ref 4.22–5.81)
RDW: 14.2 % (ref 11.5–15.5)
WBC: 3.3 10*3/uL — ABNORMAL LOW (ref 4.0–10.5)
nRBC: 0 % (ref 0.0–0.2)

## 2023-01-19 LAB — HEPATITIS PANEL, ACUTE
HCV Ab: NONREACTIVE
Hep A IgM: NONREACTIVE
Hep B C IgM: NONREACTIVE
Hepatitis B Surface Ag: NONREACTIVE

## 2023-01-19 LAB — HIV ANTIBODY (ROUTINE TESTING W REFLEX): HIV Screen 4th Generation wRfx: NONREACTIVE

## 2023-01-19 LAB — FOLATE: Folate: 15.9 ng/mL (ref >5.9)

## 2023-01-19 LAB — TECHNOLOGIST SMEAR REVIEW: Plt Morphology: ADEQUATE

## 2023-01-19 NOTE — Progress Notes (Signed)
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 Failed DOACs recently with recurrent PE status post mechanical thrombectomy It is possible that DOACs are not effective enough in patient with morbid obesity. Previously hypercoagulable work-up was negative His risk factors including morbid obesity, sedentary lifestyle etc. I agree with continue Coumadin, dosing with INR goal between 2-3. Appreciate primary care provider to continue managing his Coumadin.  It appears that INR has been very stable at goal.  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  Lymphadenopathy History of external iliac and bilateral inguinal lymph node since 2016. There was plan for ultrasound-guided biopsy of inguinal lymph node the patient never had it done. August 2023 CT abdomen pelvis without contrast chronic bilateral inguinal lymph node enlargement, favor benign process.  Other neutropenia (HCC) Check folate, B12, HIV, hepatitis, flow cytometry.  Repeat CBC, smear.   Orders Placed This Encounter  Procedures   Vitamin B12    Standing Status:   Future    Number of Occurrences:   1    Standing Expiration Date:   01/19/2024   CBC with Differential/Platelet    Standing Status:   Future    Number of Occurrences:   1    Standing Expiration Date:   01/19/2024   Flow cytometry panel-leukemia/lymphoma work-up    Standing Status:   Future    Number of Occurrences:   1    Standing Expiration Date:   01/19/2024   Folate    Standing Status:   Future    Number  of Occurrences:   1    Standing Expiration Date:   01/19/2024   Hepatitis panel, acute    Standing Status:   Future    Number of Occurrences:   1    Standing Expiration Date:   01/19/2024   Technologist smear review    Standing Status:   Future    Standing Expiration Date:   01/19/2024    Order Specific Question:   Clinical information:    Answer:   neutropenia   HIV Antibody (routine testing w rflx)    Standing Status:   Future    Number of Occurrences:   1    Standing Expiration Date:   01/19/2024   Follow-up to be determined.   All questions were answered. The patient knows to call the clinic with any problems, questions or concerns. No barriers to learning was detected.  Rickard Patience, MD 01/19/2023   CHIEF COMPLAINTS/PURPOSE OF CONSULTATION:  Recurrent DVT/PE  HISTORY OF PRESENTING ILLNESS:  Ryan [name] 52 y.o. male presents to establish care for evaluation of recurrent DVT/PE I have reviewed his chart and materials related to his cancer extensively and collaborated history with the patient. Summary of hematology history is as follows:  #Extensive history of recurrent DVT/PE, history of IVC filter.  Strong family history of VTE. Reports that he has had first lower extremity DVT diagnosed when he was 52 years old, which was treated with Coumadin.  Developed recurrent DVT when he was off Coumadin and he was started on Coumadin for chronic anticoagulation.   He has had extensive work-up done  at [facility] hematology clinic.  Initially was seen in May 2001, and most recently seen at [facility] hematology clinic on 06/27/2017.  Patient has had multiple recurrent VTE events and not able to recall all the details precisely.  He has had IVC filter placed. History of  noncompliance of his Coumadin, and diet, and not compliant with follow-up for INR checks, not able to achieve stable therapeutic INR.  Patient was switched to Xarelto in May 2021.  Patient was seen by my colleague Dr. Smith Robert in 2018 during his  hospitalization. 12/23/2016, hypercoagulable work-up including negative lupus anticoagulant, beta glycoprotein antibodies, cardiolipin antibodies, negative prothrombin gene mutation.  I do not see factor V Leiden mutation was checked.  -Factor V Leiden mutation was checked in 2003 was negative.  02/23/2017, CT abdomen pelvis without contrast showed mildly enlarged external iliac and bilateral inguinal lymph nodes.  12/25/2014, PET scan showed external iliac and inguinal hypermetabolic adenopathy.  Dr. Presented the case in the tumor board and consensus recommendation was to proceed with ultrasound-guided biopsy of inguinal lymph node. he has lost follow-up with Dr. Smith Robert and never had lymph node biopsy.  Patient reports that he was on Eliquis in the past for about a year and a half, not working and switched to Xarelto for 2 years.  He ran out of Xarelto  2 weeks and was found to have recurrent pulmonary embolism with severe right heart strain at [facility], on 10/19/2021.  12/31/2021, CT chest angiogram showed no acute or occlusive pulmonary artery embolism.  Small right pulmonary artery branch chronic PE/current significant decreased since the study of 2021.  Fatty liver. 01/01/22, bilateral lower extremity ultrasound showed symmetric nonocclusive thrombus in both legs, likely subacute to chronic in nature.   INTERVAL HISTORY Ryan [name] is a 52 y.o. male who has above history reviewed by me today presents for follow up visit for recurrent pulmonary embolism, chronic lower extremity DVT. During interval, patient was hospitalized from 12/13/2022 - 12/17/2022 due to increased work of breathing and a cough.  Patient has been on Xarelto 20 mg daily and reports to be relatively compliant with medication. CTA chest showed acute on chronic PE with right main pulmonary thrombus superimposed on bulky chronic clot which is seen adherent to the vessel wall.  Patient was admitted and underwent mechanical thrombectomy Patient  was treated with heparin and bridged to Coumadin at discharge.  He follows up with primary care provider for Coumadin dosing with INR goal of 2-3.  Today patient reports feeling well at baseline.  Chronic shortness of breath with exertion, baseline..  Bilateral lower extremity chronic edema, not worse. He reports to be compliant with his Coumadin and INR checking his INR on 01/18/2023 was 2.   MEDICAL HISTORY:  Past Medical History:  Diagnosis Date   (HFpEF) heart failure with preserved ejection fraction (HCC)    Arrhythmia    atrial fibrillation   Diabetes mellitus type II, non insulin dependent (HCC)    Diabetes mellitus without complication (HCC)    per pt-pre diabetic-Dr Dario Guardian stated said pt   DVT (deep venous thrombosis) (HCC)    Over 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  Regular the workup has not been forthcoming) previously followed by [facility]. Isaiah Serge at [facility]   Hepatic steatosis    Hypertension    Morbid obesity with BMI of 50.0-59.9, adult [facility])    Peripheral vascular disease (HCC)    Presence of IVC filter    Has 2 IVC filters with significant thrombus burden superior to the  filter.   Pulmonary embolus (HCC) 12/2016   Initially just treated with anticoagulation, but in February 2023 in April 2024 treated with EKOS thrombectomy for submassive PE.   Ventricular trigeminy    Weakness of both lower limbs     SURGICAL HISTORY: Past Surgical History:  Procedure Laterality Date   CARDIAC ELECTROPHYSIOLOGY STUDY AND ABLATION  10/2020   PERIPHERAL VASCULAR CATHETERIZATION N/A 01/10/2015   Procedure: Dialysis/Perma Catheter Insertion;  Surgeon: Renford Dills, MD;  Location: [facility] INVASIVE CV LAB;  Service: Cardiovascular;  Laterality: N/A;   PERIPHERAL VASCULAR CATHETERIZATION N/A 02/24/2015   Procedure: Dialysis/Perma Catheter Removal;  Surgeon: Annice Needy, MD;  Location: [facility] INVASIVE CV LAB;  Service: Cardiovascular;  Laterality: N/A;   PULMONARY THROMBECTOMY N/A 12/13/2022   Procedure: PULMONARY  THROMBECTOMY;  Surgeon: Annice Needy, MD;  Location: [facility] INVASIVE CV LAB;  Service: Cardiovascular;  Laterality: N/A;    SOCIAL HISTORY: Social History   Socioeconomic History   Marital status: Married    Spouse name: Not on file   Number of children: Not on file   Years of education: Not on file   Highest education level: Some college, no degree  Occupational History   Not on file  Tobacco Use   Smoking status: Never   Smokeless tobacco: Never  Vaping Use   Vaping Use: Never used  Substance and Sexual Activity   Alcohol use: No   Drug use: No   Sexual activity: Yes  Other Topics Concern   Not on file  Social History Narrative   Not on file   Social Determinants of Health   Financial Resource Strain: Low Risk  (12/30/2022)   Overall Financial Resource Strain (CARDIA)    Difficulty of Paying Living Expenses: Not very hard  Food Insecurity: No Food Insecurity (12/30/2022)   Hunger Vital Sign    Worried About Running Out of Food in the Last Year: Never true    Ran Out of Food in the Last Year: Never true  Transportation Needs: No Transportation Needs (12/30/2022)   PRAPARE - Administrator, Civil Service (Medical): No    Lack of Transportation (Non-Medical): No  Physical Activity: Insufficiently Active (12/30/2022)   Exercise Vital Sign    Days of Exercise per Week: 3 days    Minutes of Exercise per Session: 30 min  Stress: No Stress Concern Present (12/30/2022)   [employer] of Occupational Health - Occupational Stress Questionnaire    Feeling of Stress : Not at all  Social Connections: Unknown (12/30/2022)   Social Connection and Isolation Panel [NHANES]    Frequency of Communication with Friends and Family: Three times a week    Frequency of Social Gatherings with Friends and Family: Once a week    Attends Religious Services: More than 4 times per year    Active Member of [employer] or Organizations: Yes    Attends Engineer, structural: More than 4  times per year    Marital Status: Patient declined  Catering manager Violence: Not on file    FAMILY HISTORY: Family History  Problem Relation Age of Onset   Hypertension Mother    Cancer Mother    Breast cancer Mother    Prostate cancer Father    Hypertension Father    Diabetes Father    Pulmonary embolism Paternal Uncle    Heart disease Maternal Grandmother    Pulmonary embolism Paternal Grandfather     ALLERGIES:  is allergic to metrizamide, clindamycin/lincomycin, lincomycin,  and sulfa antibiotics.  MEDICATIONS:  Current Outpatient Medications  Medication Sig Dispense Refill   albuterol (VENTOLIN HFA) 108 (90 Base) MCG/ACT inhaler Inhale 2 puffs into the lungs every 6 (six) hours as needed for wheezing or shortness of breath. 8 g 2   furosemide (LASIX) 40 MG tablet Take 40 mg by mouth every other day.     magnesium oxide (MAG-OX) 400 (240 Mg) MG tablet Take 400 mg by mouth daily.     metoprolol succinate (TOPROL-XL) 25 MG 24 hr tablet Take 1 tablet (25 mg total) by mouth daily.     OXYGEN Inhale 1 L into the lungs daily.     sacubitril-valsartan (ENTRESTO) 24-26 MG Take 1 tablet by mouth 2 (two) times daily. 60 tablet 3   tirzepatide (MOUNJARO) 5 MG/0.5ML Pen Inject 5 mg into the skin once a week. 6 mL 0   warfarin (COUMADIN) 5 MG tablet Take 1 tablet (5 mg total) by mouth daily. 90 tablet 1   No current facility-administered medications for this visit.    Review of Systems  Constitutional:  Positive for fatigue. Negative for appetite change, chills, fever and unexpected weight change.  HENT:   Negative for hearing loss and voice change.   Eyes:  Negative for eye problems and icterus.  Respiratory:  Positive for shortness of breath. Negative for chest tightness and cough.   Cardiovascular:  Positive for leg swelling. Negative for chest pain.  Gastrointestinal:  Negative for abdominal distention and abdominal pain.  Endocrine: Negative for hot flashes.  Genitourinary:   Negative for difficulty urinating, dysuria and frequency.   Musculoskeletal:  Negative for arthralgias.  Skin:  Negative for itching and rash.  Neurological:  Negative for light-headedness and numbness.  Hematological:  Negative for adenopathy. Does not bruise/bleed easily.  Psychiatric/Behavioral:  Negative for confusion.      PHYSICAL EXAMINATION:  Vitals:   01/19/23 1017  BP: 133/86  Pulse: 77  Resp: 18  Temp: 97.8 F (36.6 C)   Filed Weights   01/19/23 1017  Weight: (!) 355 lb 12.8 oz (161.4 kg)    Physical Exam Constitutional:      General: He is not in acute distress.    Appearance: He is obese. He is not diaphoretic.  HENT:     Head: Normocephalic.  Eyes:     General: No scleral icterus. Cardiovascular:     Rate and Rhythm: Normal rate and regular rhythm.     Heart sounds: No murmur heard. Pulmonary:     Effort: Pulmonary effort is normal. No respiratory distress.  Chest:     Chest wall: No tenderness.  Abdominal:     General: There is no distension.     Palpations: Abdomen is soft.  Musculoskeletal:        General: Normal range of motion.     Cervical back: Normal range of motion and neck supple.  Skin:    General: Skin is warm and dry.     Comments: Chronic bilateral lower extremity hyperpigmentation/edema  Neurological:     Mental Status: He is alert and oriented to person, place, and time.     Cranial Nerves: No cranial nerve deficit.     Motor: No abnormal muscle tone.     Coordination: Coordination normal.  Psychiatric:        Mood and Affect: Mood and affect normal.      LABORATORY DATA:  I have reviewed the data as listed Lab Results  Component Value Date  WBC 3.3 (L) 01/19/2023   HGB 16.0 01/19/2023   HCT 49.1 01/19/2023   MCV 93.9 01/19/2023   PLT 248 01/19/2023   Recent Labs    07/30/22 1547 10/15/22 1005 12/13/22 2303 12/14/22 0358 12/15/22 0455 12/23/22 0953 12/27/22 1117 01/03/23 1442  NA 139   < > 138 137 136 140 139  138  K 3.8   < > 3.6 3.9 4.2 4.8 4.6 4.9  CL 105   < > 105 106 106 107* 105 105  CO2 25   < > 26 24 21* 17* 18* 17*  GLUCOSE 109*   < > 100* 116* 101* 90 96 94  BUN 16   < > 20 18 15 14 16 11   CREATININE 1.43*   < > 1.40* 1.32* 1.23 1.48* 1.41* 1.43*  CALCIUM 9.0   < > 8.0* 8.1* 8.5* 8.9 9.2 8.6*  GFRNONAA 59*   < > >60 >60 >60  --   --   --   PROT 7.8   < >  --  7.0  --  7.8 7.4 7.3  ALBUMIN 3.7   < >  --  3.4*  --  4.2 4.2 4.0  AST 24   < >  --  18  --  20 19 22   ALT 28   < >  --  16  --  28 27 24   ALKPHOS 60   < >  --  48  --  71 69 71  BILITOT 0.9   < >  --  1.2  --  0.4 0.5 0.6  BILIDIR 0.1  --   --   --   --   --   --   --   IBILI 0.8  --   --   --   --   --   --   --    < > = values in this interval not displayed.     RADIOGRAPHIC STUDIES: I have personally reviewed the radiological images as listed and agreed with the findings in the report. No results found.

## 2023-01-19 NOTE — Assessment & Plan Note (Signed)
Check folate, B12, HIV, hepatitis, flow cytometry.  Repeat CBC, smear.

## 2023-01-19 NOTE — Assessment & Plan Note (Signed)
History of external iliac and bilateral inguinal lymph node since 2016. There was plan for ultrasound-guided biopsy of inguinal lymph node the patient never had it done. August 2023 CT abdomen pelvis without contrast chronic bilateral inguinal lymph node enlargement, favor benign process.

## 2023-01-19 NOTE — Progress Notes (Signed)
Pt here for follow up. Reports he had blood clots removed from lung in April.

## 2023-01-19 NOTE — Assessment & Plan Note (Signed)
Recommend healthy diet and exercise. 

## 2023-01-19 NOTE — Progress Notes (Signed)
Results discussed with patient during visit.

## 2023-01-19 NOTE — Assessment & Plan Note (Signed)
Continue Coumadin. 

## 2023-01-19 NOTE — Assessment & Plan Note (Addendum)
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 Failed DOACs recently with recurrent PE status post mechanical thrombectomy It is possible that DOACs are not effective enough in patient with morbid obesity. Previously hypercoagulable work-up was negative His risk factors including morbid obesity, sedentary lifestyle etc. I agree with continue Coumadin, dosing with INR goal between 2-3. Appreciate primary care provider to continue managing his Coumadin.  It appears that INR has been very stable at goal.

## 2023-01-20 NOTE — Telephone Encounter (Signed)
Pt called back asking if Ryan [name] or [employer] has faxed his short term disability paper work to the office.  He said they need the most recent ov which was 5/21.  CB#  [phone]

## 2023-01-21 LAB — COMP PANEL: LEUKEMIA/LYMPHOMA

## 2023-01-25 ENCOUNTER — Telehealth: Payer: Self-pay | Admitting: Nurse Practitioner

## 2023-01-25 ENCOUNTER — Ambulatory Visit: Payer: No Typology Code available for payment source | Admitting: Nurse Practitioner

## 2023-01-25 NOTE — Telephone Encounter (Unsigned)
Copied from CRM [id number]. Topic: General - Other >> Jan 25, 2023 11:57 AM Epimenio Foot F wrote: Reason for CRM: Pt is calling in checking the status of his Short-Term disability paper. Pt says the paperwork needs to be filled out and faxed back to the sender.

## 2023-01-26 NOTE — Telephone Encounter (Signed)
Pt stated he is following up on short-term disability forms from [place]. As stated, the paperwork needs to be filled out and faxed back to the sender.  Stated deadline was yesterday, but may be given an extension.  Please advise.

## 2023-01-26 NOTE — Telephone Encounter (Signed)
Paperwork completed and faxed back.   Called and notified patient that this was completed for him.

## 2023-01-26 NOTE — Telephone Encounter (Signed)
Paperwork is in progress. Awaiting for provider to complete and sign. Will call patient as soon as it is completed.

## 2023-02-01 ENCOUNTER — Other Ambulatory Visit: Payer: Self-pay

## 2023-02-01 ENCOUNTER — Telehealth: Payer: Self-pay

## 2023-02-01 DIAGNOSIS — D708 Other neutropenia: Secondary | ICD-10-CM

## 2023-02-01 NOTE — Telephone Encounter (Signed)
Called patient and informed him that his blood work up results for low WBC look good but his B12 level is on the low end of normal and Dr. Cathie Hoops recommends that he take OTC B12 daily. Dr. Cathie Hoops would like to see him back in six months with labs. I will have scheduling schedule this and notify patient.

## 2023-02-01 NOTE — Telephone Encounter (Signed)
-----   Message from Rickard Patience, MD sent at 02/01/2023 10:06 AM EDT ----- Please let him know that his blood work up results for low wbc are good, except that B12 level is on the low normal end. Recommend B12 daily- otc   Please arrange follow up in 6 months, labs prior to MD  Cbc cmp B12- please order Thanks.  zy

## 2023-02-17 ENCOUNTER — Ambulatory Visit: Payer: No Typology Code available for payment source | Admitting: Nurse Practitioner

## 2023-02-17 NOTE — Progress Notes (Deleted)
There were no vitals taken for this visit.   Subjective:    Patient ID: Ryan [name], male    DOB: 10/14/1970, 52 y.o.   MRN: [id number]  HPI: Ryan [name] is a 52 y.o. male  No chief complaint on file.  Coumadin Management.  The expected duration of coumadin treatment is lifelong The reason for anticoagulation is   Chronic PE . He has an appt with Hematology tomorrow.  INR today was 2.0.  Present Coumadin dose: Goal: 2.0-3.0 The patient does not have an active anticoagulation episode. Excessive bruising: no Nose bleeding: no Rectal bleeding: no Prolonged menstrual cycles: N/A Eating diet with consistent amounts of foods containing Vitamin K:no Any recent antibiotic use? no  Relevant past medical, surgical, family and social history reviewed and updated as indicated. Interim medical history since our last visit reviewed. Allergies and medications reviewed and updated.  Review of Systems  All other systems reviewed and are negative.   Per HPI unless specifically indicated above     Objective:    There were no vitals taken for this visit.  Wt Readings from Last 3 Encounters:  01/19/23 (!) 355 lb 12.8 oz (161.4 kg)  01/18/23 (!) 357 lb 9.6 oz (162.2 kg)  01/03/23 (!) 361 lb (163.7 kg)    Physical Exam Vitals and nursing note reviewed.  Constitutional:      General: He is not in acute distress.    Appearance: Normal appearance. He is obese. He is not ill-appearing, toxic-appearing or diaphoretic.  HENT:     Head: Normocephalic.     Right Ear: External ear normal.     Left Ear: External ear normal.     Nose: Nose normal. No congestion or rhinorrhea.     Mouth/Throat:     Mouth: Mucous membranes are moist.  Eyes:     General:        Right eye: No discharge.        Left eye: No discharge.     Extraocular Movements: Extraocular movements intact.     Conjunctiva/sclera: Conjunctivae normal.     Pupils: Pupils are equal, round, and reactive to light.   Cardiovascular:     Rate and Rhythm: Normal rate and regular rhythm.     Heart sounds: No murmur heard. Pulmonary:     Effort: Pulmonary effort is normal. No respiratory distress.     Breath sounds: Normal breath sounds. No wheezing, rhonchi or rales.  Abdominal:     General: Abdomen is flat. Bowel sounds are normal.  Musculoskeletal:     Cervical back: Normal range of motion and neck supple.  Skin:    General: Skin is warm and dry.     Capillary Refill: Capillary refill takes less than 2 seconds.  Neurological:     General: No focal deficit present.     Mental Status: He is alert and oriented to person, place, and time.  Psychiatric:        Mood and Affect: Mood normal.        Behavior: Behavior normal.        Thought Content: Thought content normal.        Judgment: Judgment normal.    Results for orders placed or performed in visit on 01/19/23  HIV Antibody (routine testing w rflx)  Result Value Ref Range   HIV Screen 4th Generation wRfx Non Reactive Non Reactive  Hepatitis panel, acute  Result Value Ref Range   Hepatitis B Surface Ag NON REACTIVE  NON REACTIVE   HCV Ab NON REACTIVE NON REACTIVE   Hep A IgM NON REACTIVE NON REACTIVE   Hep B C IgM NON REACTIVE NON REACTIVE  Folate  Result Value Ref Range   Folate 15.9 >5.9 ng/mL  Flow cytometry panel-leukemia/lymphoma work-up  Result Value Ref Range   PATH INTERP XXX-IMP Comment    CLINICAL INFO Comment    Specimen Type Comment    ASSESSMENT OF LEUKOCYTES Comment    % Viable Cells Comment    ANALYSIS AND GATING STRATEGY Comment    IMMUNOPHENOTYPING STUDY Comment    PATHOLOGIST NAME Comment    COMMENT: Comment   CBC with Differential/Platelet  Result Value Ref Range   WBC 3.3 (L) 4.0 - 10.5 K/uL   RBC 5.23 4.22 - 5.81 MIL/uL   Hemoglobin 16.0 13.0 - 17.0 g/dL   HCT 40.9 81.1 - 91.4 %   MCV 93.9 80.0 - 100.0 fL   MCH 30.6 26.0 - 34.0 pg   MCHC 32.6 30.0 - 36.0 g/dL   RDW 78.2 95.6 - 21.3 %   Platelets 248 150  - 400 K/uL   nRBC 0.0 0.0 - 0.2 %   Neutrophils Relative % 40 %   Neutro Abs 1.3 (L) 1.7 - 7.7 K/uL   Lymphocytes Relative 44 %   Lymphs Abs 1.5 0.7 - 4.0 K/uL   Monocytes Relative 14 %   Monocytes Absolute 0.5 0.1 - 1.0 K/uL   Eosinophils Relative 2 %   Eosinophils Absolute 0.1 0.0 - 0.5 K/uL   Basophils Relative 0 %   Basophils Absolute 0.0 0.0 - 0.1 K/uL   Immature Granulocytes 0 %   Abs Immature Granulocytes 0.00 0.00 - 0.07 K/uL  Vitamin B12  Result Value Ref Range   Vitamin B-12 261 180 - 914 pg/mL  Technologist smear review  Result Value Ref Range   WBC MORPHOLOGY MORPHOLOGY UNREMARKABLE    RBC MORPHOLOGY MORPHOLOGY UNREMARKABLE    Plt Morphology PLATELETS APPEAR ADEQUATE    Clinical Information neutropenia       Assessment & Plan:   Problem List Items Addressed This Visit   None    Follow up plan: No follow-ups on file.

## 2023-02-22 ENCOUNTER — Encounter: Payer: Self-pay | Admitting: Nurse Practitioner

## 2023-02-22 ENCOUNTER — Ambulatory Visit (INDEPENDENT_AMBULATORY_CARE_PROVIDER_SITE_OTHER): Payer: No Typology Code available for payment source | Admitting: Nurse Practitioner

## 2023-02-22 ENCOUNTER — Telehealth: Payer: Self-pay

## 2023-02-22 VITALS — BP 146/92 | HR 81 | Temp 98.6°F | Wt 353.8 lb

## 2023-02-22 DIAGNOSIS — L03116 Cellulitis of left lower limb: Secondary | ICD-10-CM | POA: Diagnosis not present

## 2023-02-22 DIAGNOSIS — I2699 Other pulmonary embolism without acute cor pulmonale: Secondary | ICD-10-CM

## 2023-02-22 DIAGNOSIS — Z1211 Encounter for screening for malignant neoplasm of colon: Secondary | ICD-10-CM | POA: Diagnosis not present

## 2023-02-22 DIAGNOSIS — R0602 Shortness of breath: Secondary | ICD-10-CM

## 2023-02-22 DIAGNOSIS — I5032 Chronic diastolic (congestive) heart failure: Secondary | ICD-10-CM

## 2023-02-22 LAB — COAGUCHEK XS/INR WAIVED
INR: 2.5 — ABNORMAL HIGH (ref 0.9–1.1)
Prothrombin Time: 30.6 s

## 2023-02-22 MED ORDER — CEPHALEXIN 500 MG PO CAPS: 500.0000 mg | ORAL_CAPSULE | Freq: Four times a day (QID) | ORAL | 0 refills | Status: DC

## 2023-02-22 NOTE — Progress Notes (Signed)
Results discussed with patient during visit.

## 2023-02-22 NOTE — Assessment & Plan Note (Signed)
Chronic.  Doing well with coumadin.  INR today was 2.5.  Continue current dose of medication.  Follow up in 1 month.  No evidence of bleeding.

## 2023-02-22 NOTE — Assessment & Plan Note (Signed)
Chronic. Blood pressure is elevated at visit today. Patient is not currently taking his Entresto due to cost.  Encouraged patient to go on website and get medication coupon for 30 days free.  Referral placed to Pharmacy team to see if assistance is available for patient.

## 2023-02-22 NOTE — Progress Notes (Signed)
BP (!) 146/92 (BP Location: Right Arm, Cuff Size: Large)   Pulse 81   Temp 98.6 F (37 C) (Oral)   Wt (!) 353 lb 12.8 oz (160.5 kg)   SpO2 97%   BMI 55.40 kg/m    Subjective:    Patient ID: Ryan [name], male    DOB: Nov 27, 1970, 53 y.o.   MRN: [id number]  HPI: Ryan [name] is a 52 y.o. male  Chief Complaint  Patient presents with   Clotting Disorder   Coumadin Management.  The expected duration of coumadin treatment is lifelong The reason for anticoagulation is   Chronic PE . He has an appt with Hematology tomorrow.  INR today was 2.5.  Present Coumadin dose: Goal: 2.0-3.0  Excessive bruising: no Nose bleeding: no Rectal bleeding: no Prolonged menstrual cycles: N/A Eating diet with consistent amounts of foods containing Vitamin K:no Any recent antibiotic use? no  Patient states his oxygen is fluctuating at home.  He is dropping down to 70-95%.  States it has been in the mid 80s for about a week.  Doesn't have an appointment with Pulmonology till July 16.  Feels like he needs to go to the ER to be evaluated for some residual clot.  Patient states he has a place on his ankle that is painful at times.  Started draining today, swelling, redness.  Denies any fevers.     Relevant past medical, surgical, family and social history reviewed and updated as indicated. Interim medical history since our last visit reviewed. Allergies and medications reviewed and updated.  Review of Systems  Respiratory:  Positive for shortness of breath.   Skin:  Positive for wound.  All other systems reviewed and are negative.   Per HPI unless specifically indicated above     Objective:    BP (!) 146/92 (BP Location: Right Arm, Cuff Size: Large)   Pulse 81   Temp 98.6 F (37 C) (Oral)   Wt (!) 353 lb 12.8 oz (160.5 kg)   SpO2 97%   BMI 55.40 kg/m   Wt Readings from Last 3 Encounters:  02/22/23 (!) 353 lb 12.8 oz (160.5 kg)  01/19/23 (!) 355 lb 12.8 oz (161.4 kg)  01/18/23 (!)  357 lb 9.6 oz (162.2 kg)    Physical Exam Vitals and nursing note reviewed.  Constitutional:      General: He is not in acute distress.    Appearance: Normal appearance. He is obese. He is not ill-appearing, toxic-appearing or diaphoretic.  HENT:     Head: Normocephalic.     Right Ear: External ear normal.     Left Ear: External ear normal.     Nose: Nose normal. No congestion or rhinorrhea.     Mouth/Throat:     Mouth: Mucous membranes are moist.  Eyes:     General:        Right eye: No discharge.        Left eye: No discharge.     Extraocular Movements: Extraocular movements intact.     Conjunctiva/sclera: Conjunctivae normal.     Pupils: Pupils are equal, round, and reactive to light.  Cardiovascular:     Rate and Rhythm: Normal rate and regular rhythm.     Heart sounds: No murmur heard. Pulmonary:     Effort: Pulmonary effort is normal. No respiratory distress.     Breath sounds: Normal breath sounds. No wheezing, rhonchi or rales.  Abdominal:     General: Abdomen is flat. Bowel sounds  are normal.  Musculoskeletal:     Cervical back: Normal range of motion and neck supple.  Skin:    General: Skin is warm and dry.     Capillary Refill: Capillary refill takes less than 2 seconds.       Neurological:     General: No focal deficit present.     Mental Status: He is alert and oriented to person, place, and time.  Psychiatric:        Mood and Affect: Mood normal.        Behavior: Behavior normal.        Thought Content: Thought content normal.        Judgment: Judgment normal.     Results for orders placed or performed in visit on 01/19/23  HIV Antibody (routine testing w rflx)  Result Value Ref Range   HIV Screen 4th Generation wRfx Non Reactive Non Reactive  Hepatitis panel, acute  Result Value Ref Range   Hepatitis B Surface Ag NON REACTIVE NON REACTIVE   HCV Ab NON REACTIVE NON REACTIVE   Hep A IgM NON REACTIVE NON REACTIVE   Hep B C IgM NON REACTIVE NON  REACTIVE  Folate  Result Value Ref Range   Folate 15.9 >5.9 ng/mL  Flow cytometry panel-leukemia/lymphoma work-up  Result Value Ref Range   PATH INTERP XXX-IMP Comment    CLINICAL INFO Comment    Specimen Type Comment    ASSESSMENT OF LEUKOCYTES Comment    % Viable Cells Comment    ANALYSIS AND GATING STRATEGY Comment    IMMUNOPHENOTYPING STUDY Comment    PATHOLOGIST NAME Comment    COMMENT: Comment   CBC with Differential/Platelet  Result Value Ref Range   WBC 3.3 (L) 4.0 - 10.5 K/uL   RBC 5.23 4.22 - 5.81 MIL/uL   Hemoglobin 16.0 13.0 - 17.0 g/dL   HCT 16.1 09.6 - 04.5 %   MCV 93.9 80.0 - 100.0 fL   MCH 30.6 26.0 - 34.0 pg   MCHC 32.6 30.0 - 36.0 g/dL   RDW 40.9 81.1 - 91.4 %   Platelets 248 150 - 400 K/uL   nRBC 0.0 0.0 - 0.2 %   Neutrophils Relative % 40 %   Neutro Abs 1.3 (L) 1.7 - 7.7 K/uL   Lymphocytes Relative 44 %   Lymphs Abs 1.5 0.7 - 4.0 K/uL   Monocytes Relative 14 %   Monocytes Absolute 0.5 0.1 - 1.0 K/uL   Eosinophils Relative 2 %   Eosinophils Absolute 0.1 0.0 - 0.5 K/uL   Basophils Relative 0 %   Basophils Absolute 0.0 0.0 - 0.1 K/uL   Immature Granulocytes 0 %   Abs Immature Granulocytes 0.00 0.00 - 0.07 K/uL  Vitamin B12  Result Value Ref Range   Vitamin B-12 261 180 - 914 pg/mL  Technologist smear review  Result Value Ref Range   WBC MORPHOLOGY MORPHOLOGY UNREMARKABLE    RBC MORPHOLOGY MORPHOLOGY UNREMARKABLE    Plt Morphology PLATELETS APPEAR ADEQUATE    Clinical Information neutropenia       Assessment & Plan:   Problem List Items Addressed This Visit       Cardiovascular and Mediastinum   Pulmonary emboli (HCC) - Primary (Chronic)    Chronic.  Doing well with coumadin.  INR today was 2.5.  Continue current dose of medication.  Follow up in 1 month.  No evidence of bleeding.      Relevant Orders   CoaguChek XS/INR Waived (STAT)  Chronic diastolic CHF (congestive heart failure) (HCC)    Chronic. Blood pressure is elevated at visit  today. Patient is not currently taking his Entresto due to cost.  Encouraged patient to go on website and get medication coupon for 30 days free.  Referral placed to Pharmacy team to see if assistance is available for patient.      Relevant Orders   AMB Referral to Pharmacy Medication Management   Other Visit Diagnoses     Shortness of breath       Ongoing. Patient using 2L intermittently at home. Encouraged patient to increase to 3L to keep sats at 95%. Appt made for patient to see Pulm at 1015 tomorrow.   Cellulitis of left ankle       Complete course of Keflex, leave open to air, discussed s/s to monitor for and when to follow up.   Screening for colon cancer       Relevant Orders   Ambulatory referral to Gastroenterology        Follow up plan: Return in about 1 month (around 03/24/2023) for Coag check.  A total of 30 minutes were spent on this encounter today.  When total time is documented, this includes both the face-to-face and non-face-to-face time personally spent before, during and after the visit on the date of the encounter plan of care, follow up and medications.

## 2023-02-22 NOTE — Progress Notes (Signed)
   Care Guide Note  02/22/2023 Name: BARAA TUBBS MRN: [id number] DOB: 14-Feb-1971  Referred by: [name], NP Reason for referral : Care Coordination (Outreach to schedule with Pharm d )   BAYLEN BUCKNER is a 52 y.o. year old male who is a primary care patient of [name], NP. [name] was referred to the pharmacist for assistance related to DM.    Successful contact was made with the patient to discuss pharmacy services including being ready for the pharmacist to call at least 5 minutes before the scheduled appointment time, to have medication bottles and any blood sugar or blood pressure readings ready for review. The patient agreed to meet with the pharmacist via with the pharmacist via telephone visit on (date/time).  03/09/2023   Penne Lash, RMA Care Guide [facility]  [place], [place] [address] Direct Dial: [phone] [email or web address]

## 2023-02-23 ENCOUNTER — Telehealth: Payer: Self-pay

## 2023-02-23 NOTE — Telephone Encounter (Signed)
Voice message left for patient to let him know we have received a referral from his pcp to schedule his colonoscopy, however due to his recent health issues-we will post pone scheduling until 6 months out.   Message will be sent to patients PCP to let her know.  Thanks,  [place], [place]

## 2023-02-24 ENCOUNTER — Other Ambulatory Visit: Payer: Self-pay | Admitting: Specialist

## 2023-02-24 DIAGNOSIS — R0609 Other forms of dyspnea: Secondary | ICD-10-CM

## 2023-02-24 DIAGNOSIS — I2699 Other pulmonary embolism without acute cor pulmonale: Secondary | ICD-10-CM

## 2023-02-25 ENCOUNTER — Telehealth: Payer: Self-pay | Admitting: Nurse Practitioner

## 2023-02-25 NOTE — Telephone Encounter (Unsigned)
Copied from CRM [id number]. Topic: General - Other >> Feb 25, 2023  9:45 AM Dondra Prader A wrote: Reason for CRM: Morrie Sheldon Clinical Liaison with [name] is calling to speak with [place]. Morrie Sheldon states that he faxed over paperwork for the pt PCP to fill out and send back regarding doing an INR test for the pt. Morrie Sheldon is calling to check on the status. Please advised.

## 2023-03-07 ENCOUNTER — Ambulatory Visit
Admission: RE | Admit: 2023-03-07 | Discharge: 2023-03-07 | Disposition: A | Discharge status: Home / Self Care | Payer: No Typology Code available for payment source | Source: Ambulatory Visit | Attending: Specialist | Admitting: Specialist

## 2023-03-07 DIAGNOSIS — I2699 Other pulmonary embolism without acute cor pulmonale: Secondary | ICD-10-CM | POA: Insufficient documentation

## 2023-03-07 DIAGNOSIS — R0609 Other forms of dyspnea: Secondary | ICD-10-CM

## 2023-03-07 MED ORDER — TECHNETIUM TO 99M ALBUMIN AGGREGATED
4.0000 | Freq: Once | INTRAVENOUS | Status: AC | PRN
  Administered 2023-03-07: 4.21 via INTRAVENOUS

## 2023-03-09 ENCOUNTER — Other Ambulatory Visit: Payer: No Typology Code available for payment source

## 2023-03-09 NOTE — Progress Notes (Signed)
   03/09/2023  Patient ID: Ryan [name], male   DOB: [birth year], 52 y.o.   MRN: [id number]  S/O Telephone visit in response to referral placed by PCP, [name], to assist patient with affordability of Entresto   Medication Access/Adherence -Patient was prescribed Entresto recently and received a 30 day supply at no cost with manufacturer trial coupon -Refill at pharmacy is $300, which he cannot afford; so he has not had the medication in approximately 1 month -Based on [employer] plan, he does qualify for the manufacturer copay coupon card -Also discussed the affordability of Mounjaro with patient, and he states he cannot remember what he pays for this medication; but it has been affordable so far   A/P  Medication Access/Adherence -I obtained Entresto copay card information on his behalf and provided information to [employer].  The prescription is now going through for $10.  The copay card has a yearly benefit of $4100; and with his copay being $300, this should cover him for 12 months.  After this point, we should be able to get a new copay card -Got copay card for [facility] that I am sending him the information for in MyChart message, so he can provide it to the pharmacy.  If his copay is anything >$25, this should help cover some of that cost for him, also.  Lenna Gilford, PharmD, DPLA

## 2023-03-24 ENCOUNTER — Ambulatory Visit: Payer: No Typology Code available for payment source | Admitting: Physician Assistant

## 2023-03-29 ENCOUNTER — Other Ambulatory Visit: Payer: Self-pay | Admitting: Nurse Practitioner

## 2023-03-30 NOTE — Telephone Encounter (Signed)
Unable to refill per protocol, Rx expired. Discontinued 07/15/22, change in therapy.  Requested Prescriptions  Pending Prescriptions Disp Refills   MOUNJARO 2.5 MG/0.5ML Pen [Pharmacy Med Name: Greggory Keen 2.5MG /0.5ML INJ ( 4 PENS)] 2 mL 0    Sig: ADMINISTER 2.5MG  UNDER THE SKIN 1 TIME A WEEK     Off-Protocol Failed - 03/29/2023  9:08 AM      Failed - Medication not assigned to a protocol, review manually.      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)   [place] [facility] [name], NP   3 months ago Hospital discharge follow-up   [place] [facility] [name], NP       Future Appointments             In 5 days Mecum, Oswaldo Conroy, PA-C [facility], [facility]

## 2023-04-04 ENCOUNTER — Ambulatory Visit (INDEPENDENT_AMBULATORY_CARE_PROVIDER_SITE_OTHER): Payer: No Typology Code available for payment source | Admitting: Physician Assistant

## 2023-04-04 ENCOUNTER — Encounter: Payer: Self-pay | Admitting: Physician Assistant

## 2023-04-04 VITALS — BP 141/85 | HR 91 | Ht 67.0 in | Wt 346.6 lb

## 2023-04-04 DIAGNOSIS — S91002D Unspecified open wound, left ankle, subsequent encounter: Secondary | ICD-10-CM | POA: Diagnosis not present

## 2023-04-04 DIAGNOSIS — D689 Coagulation defect, unspecified: Secondary | ICD-10-CM

## 2023-04-04 DIAGNOSIS — I2699 Other pulmonary embolism without acute cor pulmonale: Secondary | ICD-10-CM | POA: Diagnosis not present

## 2023-04-04 DIAGNOSIS — L03116 Cellulitis of left lower limb: Secondary | ICD-10-CM | POA: Diagnosis not present

## 2023-04-04 LAB — COAGUCHEK XS/INR WAIVED
INR: 2 — ABNORMAL HIGH (ref 0.9–1.1)
Prothrombin Time: 24.3 s

## 2023-04-04 MED ORDER — CEPHALEXIN 500 MG PO CAPS
500.0000 mg | ORAL_CAPSULE | Freq: Four times a day (QID) | ORAL | 0 refills | Status: DC
Start: 2023-04-04 — End: 2023-05-03

## 2023-04-04 NOTE — Progress Notes (Signed)
Acute Office Visit   Patient: Ryan [name]   DOB: 1971/07/08   52 y.o. Male  MRN: [id number] Visit Date: 04/04/2023  Today's healthcare provider: Oswaldo Conroy , PA-C  Introduced myself to the patient as a Secondary school teacher and provided education on APPs in clinical practice.    Chief Complaint  Patient presents with   Coagulation Disorder   Leg Problem    Patient says he has noticed a spot on lower L leg above his ankle area. Patient says his first noticed it about three weeks to a month ago. Patient says it is gradually getting worse. Patient says last night, he noticed a lot of drainage to where his pajama pants were soaks. Patient says it is to the point where it is hurting him to walk on it.    Subjective    HPI HPI     Leg Problem    Additional comments: Patient says he has noticed a spot on lower L leg above his ankle area. Patient says his first noticed it about three weeks to a month ago. Patient says it is gradually getting worse. Patient says last night, he noticed a lot of drainage to where his pajama pants were soaks. Patient says it is to the point where it is hurting him to walk on it.       Last edited by Malen Gauze, CMA on 04/04/2023  9:20 AM.      Coumadin Management.  The expected duration of coumadin treatment is lifelong The reason for anticoagulation is   chronic PE .  Present Coumadin dose: 5 mg PO every day  INR today: 2.0 PT: 24.3 sec  Goal: 2.0-3.0  Excessive bruising: no Nose bleeding: no Rectal bleeding: no Prolonged menstrual cycles: N/A Eating diet with consistent amounts of foods containing Vitamin K:no Any recent antibiotic use? yes about a week ago for wound on left leg   Lower left leg concern  States there is drainage on left lower leg and pain  Reports there is a wound on left lower extremity that is not getting   Duration: reports wound has been present for about a month States last night the wound was draining more than it ever has  and soaked through his pants and sheets Reports pain is on and off and has been taking Tylenol for it   He reports he just finished abx for this about a week ago - reviewed that this abx was a 10 day course and was prescribed  around the end of June 2024    Medications: Outpatient Medications Prior to Visit  Medication Sig   albuterol (VENTOLIN HFA) 108 (90 Base) MCG/ACT inhaler Inhale 2 puffs into the lungs every 6 (six) hours as needed for wheezing or shortness of breath.   furosemide (LASIX) 40 MG tablet Take 40 mg by mouth every other day.   magnesium oxide (MAG-OX) 400 (240 Mg) MG tablet Take 400 mg by mouth daily.   metoprolol succinate (TOPROL-XL) 25 MG 24 hr tablet Take 1 tablet (25 mg total) by mouth daily.   OXYGEN Inhale 1 L into the lungs daily.   sacubitril-valsartan (ENTRESTO) 24-26 MG Take 1 tablet by mouth 2 (two) times daily.   tirzepatide [facility]) 5 MG/0.5ML Pen Inject 5 mg into the skin once a week.   warfarin (COUMADIN) 5 MG tablet Take 1 tablet (5 mg total) by mouth daily.   [DISCONTINUED] cephALEXin (KEFLEX) 500 MG capsule Take 1  capsule (500 mg total) by mouth 4 (four) times daily.   No facility-administered medications prior to visit.    Review of Systems  HENT:  Negative for nosebleeds.   Gastrointestinal:  Negative for anal bleeding and blood in stool.  Skin:  Positive for rash and wound.  Hematological:  Does not bruise/bleed easily.         Objective    BP (!) 141/85   Pulse 91   Ht 5\' 7"  (1.702 m)   Wt (!) 346 lb 9.6 oz (157.2 kg)   SpO2 90%   BMI 54.29 kg/m      Physical Exam Vitals reviewed.  Constitutional:      General: He is awake.     Appearance: Normal appearance. He is well-developed and well-groomed.  HENT:     Head: Normocephalic and atraumatic.  Skin:    General: Skin is warm and dry.     Findings: Wound present.  Neurological:     Mental Status: He is alert.  Psychiatric:        Behavior: Behavior is cooperative.       Media Information   Document Information  Photos    04/04/2023 09:57  Attached To:  Office Visit on 04/04/23 with , Oswaldo Conroy, PA-C  Source Information  , [facility] Mo  [facility]  Document History      Results for orders placed or performed in visit on 04/04/23  CoaguChek XS/INR Waived (STAT)  Result Value Ref Range   INR 2.0 (H) 0.9 - 1.1   Prothrombin Time 24.3 sec    Assessment & Plan      Return in about 2 weeks (around 04/18/2023) for wound check .      Problem List Items Addressed This Visit       Cardiovascular and Mediastinum   Pulmonary emboli (HCC) - Primary (Chronic)    Chronic, ongoing, appear stable He is taking Coumadin for management INR was 2.0 today  Continue with current dosing of Coumadin  He denies concerns for bleeding or easy bruising today  Follow up in 1 month or sooner if concerns arise         Hematopoietic and Hemostatic   Clotting disorder (HCC)   Relevant Orders   CoaguChek XS/INR Waived (STAT) (Completed)   Other Visit Diagnoses     Open wound of left ankle, subsequent encounter     Acute, ongoing Reviewed most recent chart and patient was initially treated for cellulitis of left lower extremity at the end of June with keflex but it does not appear that he was taking appropriately as he just finished 10 day course about a week ago Will repeat Keflex 500 mg PO QID x 10 days and place referral to Wound care for evaluation given that skin is ulcerated at this time Recommend follow up in about 2 weeks for wound check or sooner if concerns arise    Relevant Orders   AMB referral to wound care center   Cellulitis of left ankle       Relevant Medications   cephALEXin (KEFLEX) 500 MG capsule   Other Relevant Orders   AMB referral to wound care center        Return in about 2 weeks (around 04/18/2023) for wound check .   I,  E , PA-C, have reviewed all documentation for this visit. The  documentation on 04/04/23 for the exam, diagnosis, procedures, and orders are all accurate and complete.    ,  MHS, PA-C [facility]

## 2023-04-04 NOTE — Assessment & Plan Note (Signed)
Chronic, ongoing, appear stable He is taking Coumadin for management INR was 2.0 today  Continue with current dosing of Coumadin  He denies concerns for bleeding or easy bruising today  Follow up in 1 month or sooner if concerns arise

## 2023-04-18 ENCOUNTER — Ambulatory Visit: Payer: No Typology Code available for payment source | Admitting: Physician Assistant

## 2023-04-18 ENCOUNTER — Encounter: Payer: No Typology Code available for payment source | Attending: Physician Assistant | Admitting: Physician Assistant

## 2023-04-18 DIAGNOSIS — I130 Hypertensive heart and chronic kidney disease with heart failure and stage 1 through stage 4 chronic kidney disease, or unspecified chronic kidney disease: Secondary | ICD-10-CM | POA: Diagnosis not present

## 2023-04-18 DIAGNOSIS — L97822 Non-pressure chronic ulcer of other part of left lower leg with fat layer exposed: Secondary | ICD-10-CM | POA: Insufficient documentation

## 2023-04-18 DIAGNOSIS — I87332 Chronic venous hypertension (idiopathic) with ulcer and inflammation of left lower extremity: Secondary | ICD-10-CM | POA: Diagnosis not present

## 2023-04-18 DIAGNOSIS — I5042 Chronic combined systolic (congestive) and diastolic (congestive) heart failure: Secondary | ICD-10-CM | POA: Diagnosis not present

## 2023-04-18 DIAGNOSIS — Z79899 Other long term (current) drug therapy: Secondary | ICD-10-CM | POA: Insufficient documentation

## 2023-04-18 DIAGNOSIS — E11621 Type 2 diabetes mellitus with foot ulcer: Secondary | ICD-10-CM | POA: Diagnosis not present

## 2023-04-18 DIAGNOSIS — L97522 Non-pressure chronic ulcer of other part of left foot with fat layer exposed: Secondary | ICD-10-CM | POA: Insufficient documentation

## 2023-04-18 DIAGNOSIS — N183 Chronic kidney disease, stage 3 unspecified: Secondary | ICD-10-CM | POA: Insufficient documentation

## 2023-04-18 DIAGNOSIS — I890 Lymphedema, not elsewhere classified: Secondary | ICD-10-CM | POA: Diagnosis not present

## 2023-04-18 DIAGNOSIS — Z794 Long term (current) use of insulin: Secondary | ICD-10-CM | POA: Diagnosis not present

## 2023-04-18 DIAGNOSIS — E1122 Type 2 diabetes mellitus with diabetic chronic kidney disease: Secondary | ICD-10-CM | POA: Diagnosis not present

## 2023-04-18 DIAGNOSIS — I7389 Other specified peripheral vascular diseases: Secondary | ICD-10-CM | POA: Insufficient documentation

## 2023-04-18 DIAGNOSIS — E11622 Type 2 diabetes mellitus with other skin ulcer: Secondary | ICD-10-CM | POA: Diagnosis present

## 2023-04-20 ENCOUNTER — Ambulatory Visit: Payer: No Typology Code available for payment source | Admitting: Physician Assistant

## 2023-04-20 ENCOUNTER — Other Ambulatory Visit: Payer: Self-pay | Admitting: Nurse Practitioner

## 2023-04-20 NOTE — Telephone Encounter (Signed)
Medication Refill - Medication: furosemide (LASIX) 40 MG tablet [[id number]]  Has the patient contacted their pharmacy? Yes.    Preferred Pharmacy (with phone number or street name):  [id number] - [place], [place] - [address] AT [facility] OF [address]  [address] [place] [address]  Phone: [phone] Fax: [phone]  Hours: Not open 24 hours     Has the patient been seen for an appointment in the last year OR does the patient have an upcoming appointment? Yes.    Agent: Please be advised that RX refills may take up to 3 business days. We ask that you follow-up with your pharmacy.

## 2023-04-21 ENCOUNTER — Other Ambulatory Visit: Payer: Self-pay | Admitting: Physician Assistant

## 2023-04-21 MED ORDER — FUROSEMIDE 40 MG PO TABS: 40.0000 mg | ORAL_TABLET | ORAL | 0 refills | Status: DC

## 2023-04-21 NOTE — Telephone Encounter (Signed)
Requested medication (s) are due for refill today: routing for approval  Requested medication (s) are on the active medication list: yes  Last refill:  07/15/22  Future visit scheduled: yes  Notes to clinic:  Unable to refill per protocol, last refill by historical provider.  Routing for approval.     Requested Prescriptions  Pending Prescriptions Disp Refills   furosemide (LASIX) 40 MG tablet 30 tablet     Sig: Take 1 tablet (40 mg total) by mouth every other day.     Cardiovascular:  Diuretics - Loop Failed - 04/20/2023 12:02 PM      Failed - Ca in normal range and within 180 days    Calcium  Date Value Ref Range Status  01/03/2023 8.6 (L) 8.7 - 10.2 mg/dL Final   Calcium, Total  Date Value Ref Range Status  12/28/2014 8.1 (L) mg/dL Final    Comment:    6.3-87.5 NOTE: New Reference Range  11/05/14          Failed - Cr in normal range and within 180 days    Creatinine  Date Value Ref Range Status  12/28/2014 5.55 (H) mg/dL Final    Comment:    6.43-3.29 NOTE: New Reference Range  11/05/14    Creatinine, Ser  Date Value Ref Range Status  01/03/2023 1.43 (H) 0.76 - 1.27 mg/dL Final         Failed - Last BP in normal range    BP Readings from Last 1 Encounters:  04/04/23 (!) 141/85         Passed - K in normal range and within 180 days    Potassium  Date Value Ref Range Status  01/03/2023 4.9 3.5 - 5.2 mmol/L Final  12/28/2014 3.8 mmol/L Final    Comment:    3.5-5.1 NOTE: New Reference Range  11/05/14          Passed - Na in normal range and within 180 days    Sodium  Date Value Ref Range Status  01/03/2023 138 134 - 144 mmol/L Final  12/28/2014 130 (L) mmol/L Final    Comment:    135-145 NOTE: New Reference Range  11/05/14          Passed - Cl in normal range and within 180 days    Chloride  Date Value Ref Range Status  01/03/2023 105 96 - 106 mmol/L Final  12/28/2014 94 (L) mmol/L Final    Comment:    101-111 NOTE: New Reference  Range  11/05/14          Passed - Mg Level in normal range and within 180 days    Magnesium  Date Value Ref Range Status  12/15/2022 2.0 1.7 - 2.4 mg/dL Final    Comment:    Performed at [facility], [address] [place], [place] [address]  12/28/2014 2.7 (H) mg/dL Final    Comment:    1.6-6.0 THERAPEUTIC RANGE: 4-7 mg/dL TOXIC: > 10 mg/dL  ----------------------- NOTE: New Reference Range  11/05/14  - MG-HEMOLYSIS AT THIS LEVEL MAY AFFECT THE RESULT          Passed - Valid encounter within last 6 months    Recent Outpatient Visits           2 weeks ago Pulmonary embolism, other, unspecified chronicity, unspecified whether acute cor pulmonale present (HCC)   [place] [facility] Mecum, Erin E, PA-C   1 month ago Other acute pulmonary embolism without acute cor  pulmonale [facility])   [place] [facility] [name], NP   3 months ago Clotting disorder [facility])   [place] [facility] [name], NP   3 months ago 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

## 2023-04-22 NOTE — Telephone Encounter (Signed)
Requested medication (s) are due for refill today:   Provider to review  Requested medication (s) are on the active medication list:   Yes  Future visit scheduled:   Yes   Last ordered: 04/21/2023 #30, 0 refills   Returned because a 90 day supply is being requested.   She takes it every other day.  Requested Prescriptions  Pending Prescriptions Disp Refills   furosemide (LASIX) 40 MG tablet [Pharmacy Med Name: FUROSEMIDE 40MG  TABLETS] 45 tablet     Sig: TAKE 1 TABLET(40 MG) BY MOUTH EVERY OTHER DAY     Cardiovascular:  Diuretics - Loop Failed - 04/21/2023  4:30 PM      Failed - Ca in normal range and within 180 days    Calcium  Date Value Ref Range Status  01/03/2023 8.6 (L) 8.7 - 10.2 mg/dL Final   Calcium, Total  Date Value Ref Range Status  12/28/2014 8.1 (L) mg/dL Final    Comment:    6.2-95.2 NOTE: New Reference Range  11/05/14          Failed - Cr in normal range and within 180 days    Creatinine  Date Value Ref Range Status  12/28/2014 5.55 (H) mg/dL Final    Comment:    8.41-3.24 NOTE: New Reference Range  11/05/14    Creatinine, Ser  Date Value Ref Range Status  01/03/2023 1.43 (H) 0.76 - 1.27 mg/dL Final         Failed - Last BP in normal range    BP Readings from Last 1 Encounters:  04/04/23 (!) 141/85         Passed - K in normal range and within 180 days    Potassium  Date Value Ref Range Status  01/03/2023 4.9 3.5 - 5.2 mmol/L Final  12/28/2014 3.8 mmol/L Final    Comment:    3.5-5.1 NOTE: New Reference Range  11/05/14          Passed - Na in normal range and within 180 days    Sodium  Date Value Ref Range Status  01/03/2023 138 134 - 144 mmol/L Final  12/28/2014 130 (L) mmol/L Final    Comment:    135-145 NOTE: New Reference Range  11/05/14          Passed - Cl in normal range and within 180 days    Chloride  Date Value Ref Range Status  01/03/2023 105 96 - 106 mmol/L Final  12/28/2014 94 (L) mmol/L Final    Comment:     101-111 NOTE: New Reference Range  11/05/14          Passed - Mg Level in normal range and within 180 days    Magnesium  Date Value Ref Range Status  12/15/2022 2.0 1.7 - 2.4 mg/dL Final    Comment:    Performed at [facility], [address] [place], [place] [address]  12/28/2014 2.7 (H) mg/dL Final    Comment:    7.2-5.3 THERAPEUTIC RANGE: 4-7 mg/dL TOXIC: > 10 mg/dL  ----------------------- NOTE: New Reference Range  11/05/14  - MG-HEMOLYSIS AT THIS LEVEL MAY AFFECT THE RESULT          Passed - Valid encounter within last 6 months    Recent Outpatient Visits           2 weeks ago Pulmonary embolism, other, unspecified chronicity, unspecified whether acute cor pulmonale present [facility])   [place] [facility] Mecum, Oswaldo Conroy, PA-C  1 month ago Other acute pulmonary embolism without acute cor pulmonale (HCC)   [place] [facility] [name], NP   3 months ago Clotting disorder [facility])   [place] [facility] [name], NP   3 months ago Chronic deep vein thrombosis (DVT) of distal vein of both lower extremities [facility]   [place] [facility] [name], NP   3 months ago Stage 3a chronic kidney disease [facility])   [place] [facility] [name], NP       Future Appointments             In 1 week Mecum, Oswaldo Conroy, PA-C [facility]   In 3 months Darrick Huntsman, Mar Daring, MD [facility] [place]

## 2023-04-25 ENCOUNTER — Encounter: Payer: No Typology Code available for payment source | Admitting: Internal Medicine

## 2023-04-25 DIAGNOSIS — E11622 Type 2 diabetes mellitus with other skin ulcer: Secondary | ICD-10-CM | POA: Diagnosis not present

## 2023-04-27 ENCOUNTER — Ambulatory Visit: Payer: Self-pay

## 2023-04-27 NOTE — Telephone Encounter (Signed)
Summary: medication request   Patient called stated he has a wound the size of a quarter on his left leg and is requesting a pain medication. Please f/u with patient     Chief Complaint: Wound left lower leg at ankle. Quarter size. Wound clinic debrided and has compression dressing on leg. Asking for something for pain. Symptoms: Above Frequency: This week Pertinent Negatives: Patient denies  Disposition: [] ED /[] Urgent Care (no appt availability in office) / [] Appointment(In office/virtual)/ []  [facility]/ [] Home Care/ [] Refused Recommended Disposition /[] [place] [facility]/ [x]  Follow-up with PCP Additional Notes: Please advise pt.  Reason for Disposition  [1] SEVERE pain AND [2] not improved 2 hours after pain medicine/ice packs  Answer Assessment - Initial Assessment Questions 1. APPEARANCE of INJURY: "What does the injury look like?"      Circular 2. SIZE: "How large is the cut?"      Quarter size 3. BLEEDING: "Is it bleeding now?" If Yes, ask: "Is it difficult to stop?"      No 4. LOCATION: "Where is the injury located?"      Left leg - lower beside ankle 5. ONSET: "How long ago did the injury occur?"      4-6 weeks ago 6. MECHANISM: "Tell me how it happened."      N/a 7. TETANUS: "When was the last tetanus booster?"     N/a 8. PREGNANCY: "Is there any chance you are pregnant?" "When was your last menstrual period?"     N/a  Protocols used: Skin Injury-A-AH

## 2023-04-29 ENCOUNTER — Telehealth: Payer: Self-pay | Admitting: Nurse Practitioner

## 2023-04-29 NOTE — Telephone Encounter (Signed)
Pt is calling to follow up on request from NT for pain medication. Please advise [phone]

## 2023-04-29 NOTE — Progress Notes (Signed)
SHAUNAK, FLEURIMOND ([id number]) [phone] [email or web address] Page 1 of 5 Visit Report for 04/18/2023 Abuse Risk Screen Details Patient Name: Date of Service: Ryan [name], Ryan RLES [name]. 04/18/2023 9:30 [name] Medical Record Number: [id number] Patient Account Number: [id number] Date of Birth/Sex: Treating RN: 1971/05/22 (52 y.o. [name]) Yevonne Pax Primary Care Nathanel Tallman: [name] Other Clinician: Referring Tyton Abdallah: Treating Jamile Rekowski/Extender: [name] in Treatment: 0 Abuse Risk Screen Items Answer ABUSE RISK SCREEN: Has anyone close to you tried to hurt or harm you recentlyo No Do you feel uncomfortable with anyone in your familyo No Has anyone forced you do things that you didnt want to doo No Electronic Signature(s) Signed: 04/29/2023 11:59:03 AM By: Yevonne Pax RN Entered By: Yevonne Pax on 04/18/2023 06:58:44 -------------------------------------------------------------------------------- Activities of Daily Living Details Patient Name: Date of Service: Ryan [name], Ryan [name]. 04/18/2023 9:30 [name] Medical Record Number: [id number] Patient Account Number: [id number] Date of Birth/Sex: Treating RN: Feb 13, 1971 (52 y.o. [name] Primary Care Montavius Subramaniam: [name] Other Clinician: Referring Tessia Kassin: Treating Rekha Hobbins/Extender: [name] in Treatment: 0 Activities of Daily Living Items Answer Activities of Daily Living (Please select one for each item) Drive Automobile Completely Able T Medications ake Completely Able Use T elephone Completely Able Care for Appearance Completely Able Use T oilet Completely Able Bath / Shower Completely Able Dress Self Completely Able Feed Self Completely Able Walk Completely Able Get In / Out Bed Completely Able Housework Completely [place], [place] [name] ([id number]) [phone] [id number] Page 2 of 5 Prepare Meals Completely Able Handle Money Completely  Able Shop for Self Completely Able Electronic Signature(s) Signed: 04/29/2023 11:59:03 AM By: Yevonne Pax RN Entered By: Yevonne Pax on 04/18/2023 06:59:10 -------------------------------------------------------------------------------- Education Screening Details Patient Name: Date of Service: Ryan Elm RLES [name]. 04/18/2023 9:30 [name] Medical Record Number: [id number] Patient Account Number: [id number] Date of Birth/Sex: Treating RN: [birth year] (52 y.o. [name] Primary Care Raiyan Dalesandro: [name] Other Clinician: Referring Trequan Marsolek: Treating Danniel Grenz/Extender: [name] in Treatment: 0 Primary Learner Assessed: Patient Learning Preferences/Education Level/Primary Language Learning Preference: Explanation Highest Education Level: College or Above Preferred Language: English Cognitive Barrier Language Barrier: No Translator Needed: No Memory Deficit: No Emotional Barrier: No Cultural/Religious Beliefs Affecting Medical Care: No Physical Barrier Impaired Vision: Yes Glasses Impaired Hearing: No Decreased Hand dexterity: No Knowledge/Comprehension Knowledge Level: Medium Comprehension Level: High Ability to understand written instructions: High Ability to understand verbal instructions: High Motivation Anxiety Level: Anxious Cooperation: Cooperative Education Importance: Acknowledges Need Interest in Health Problems: Asks Questions Perception: Coherent Willingness to Engage in Self-Management High Activities: Readiness to Engage in Self-Management High Activities: Electronic Signature(s) Signed: 04/29/2023 11:59:03 AM By: Yevonne Pax RN Entered By: Yevonne Pax on 04/18/2023 06:59:38 [place], Bing Neighbors ([id number]) [id number] Page 3 of 5 -------------------------------------------------------------------------------- Fall Risk Assessment Details Patient Name: Date of Service: Ryan [name], Ryan RLES [name]. 04/18/2023  9:30 [name] Medical Record Number: [id number] Patient Account Number: [id number] Date of Birth/Sex: Treating RN: 07/28/1971 (52 y.o. [name]) Yevonne Pax Primary Care Alexei Ey: [name] Other Clinician: Referring Arvine Clayburn: Treating Ines Rebel/Extender: [name] in Treatment: 0 Fall Risk Assessment Items Have you had 2 or more falls in the last 12 monthso 0 No Have you had any fall that resulted in injury in the last 12 monthso 0 No FALLS RISK SCREEN History of falling - immediate or within 3 months 0 No Secondary diagnosis (Do you have 2 or more medical diagnoseso) 0 No Ambulatory aid None/bed  rest/wheelchair/nurse 0 Yes Crutches/cane/walker 0 No Furniture 0 No Intravenous therapy Access/Saline/Heparin Lock 0 No Gait/Transferring Normal/ bed rest/ wheelchair 0 Yes Weak (short steps with or without shuffle, stooped but able to lift head while walking, may seek 0 No support from furniture) Impaired (short steps with shuffle, may have difficulty arising from chair, head down, impaired 0 No balance) Mental Status Oriented to own ability 0 Yes Electronic Signature(s) Signed: 04/29/2023 11:59:03 AM By: Yevonne Pax RN Entered By: Yevonne Pax on 04/18/2023 06:59:59 -------------------------------------------------------------------------------- Foot Assessment Details Patient Name: Date of Service: Ryan Elm RLES [name]. 04/18/2023 9:30 [name] Medical Record Number: [id number] Patient Account Number: [id number] Date of Birth/Sex: Treating RN: [birth year] (52 y.o. [name] Primary Care Haydn Hutsell: [name] Other Clinician: Referring Shiv Shuey: Treating Jeren Dufrane/Extender: [name] in Treatment: 0 Foot Assessment Items Site Locations Ryan [name], Ryan [name] ([id number]) [phone] [id number] Page 4 of 5 + = Sensation present, - = Sensation absent, C = Callus, U = Ulcer R = Redness, W = Warmth, M = Maceration, PU =  Pre-ulcerative lesion F = Fissure, S = Swelling, D = Dryness Assessment Right: Left: Other Deformity: No No Prior Foot Ulcer: No No Prior Amputation: No No Charcot Joint: No No Ambulatory Status: Ambulatory Without Help Gait: Steady Electronic Signature(s) Signed: 04/29/2023 11:59:03 AM By: Yevonne Pax RN Entered By: Yevonne Pax on 04/18/2023 07:16:58 -------------------------------------------------------------------------------- Nutrition Risk Screening Details Patient Name: Date of Service: Ryan [name], Ryan [name]. 04/18/2023 9:30 [name] Medical Record Number: [id number] Patient Account Number: [id number] Date of Birth/Sex: Treating RN: February 05, 1971 (52 y.o. [name]) Yevonne Pax Primary Care Zenas Santa: [name] Other Clinician: Referring Linnell Swords: Treating Gimena Buick/Extender: [name] in Treatment: 0 Height (in): 67 Weight (lbs): 338 Body Mass Index (BMI): 52.9 Nutrition Risk Screening Items Score Screening NUTRITION RISK SCREEN: I have an illness or condition that made me change the kind and/or amount of food I eat 2 Yes I eat fewer than two meals per day 0 No I eat few fruits and vegetables, or milk products 0 No I have three or more drinks of beer, liquor or wine almost every day 0 No I have tooth or mouth problems that make it hard for me to eat 0 No I don't always have enough money to buy the food I need 0 No Ryan [name], Ryan [name] ([id number]) [id number] Page 5 of 5 I eat alone most of the time 0 No I take three or more different prescribed or over-the-counter drugs [name] day 1 Yes Without wanting to, I have lost or gained 10 pounds in the last six months 0 No I am not always physically able to shop, cook and/or feed myself 0 No Nutrition Protocols Good Risk Protocol Moderate Risk Protocol 0 Provide education on nutrition High Risk Proctocol Risk Level: Moderate Risk Score: 3 Electronic Signature(s) Signed: 04/29/2023  11:59:03 AM By: Yevonne Pax RN Entered By: Yevonne Pax on 04/18/2023 07:00:10

## 2023-04-29 NOTE — Progress Notes (Signed)
AJENE, KRIEL [name] ([id number]) [id number] Page 1 of 11 Visit Report for 04/25/2023 Arrival Information Details Patient Name: Date of Service: Ryan [name], Ryan [name]. 04/25/2023 3:15 PM Medical Record Number: [id number] Patient Account Number: [id number] Date of Birth/Sex: Treating RN: 08/29/1971 (52 y.o. [name]) Yevonne Pax Primary Care Tiburcio Linder: [name] Other Clinician: Referring Tobi Groesbeck: Treating Ewin Rehberg/Extender: RO BSO Dorris Carnes, MICHA EL Jones Skene, Kellie Shropshire in Treatment: 1 Visit Information History Since Last Visit Added or deleted any medications: No Patient Arrived: Ambulatory Any new allergies or adverse reactions: No Arrival Time: 15:19 Had [name] fall or experienced change in No Accompanied By: self activities of daily living that may affect Transfer Assistance: None risk of falls: Patient Identification Verified: Yes Signs or symptoms of abuse/neglect since last visito No Secondary Verification Process Completed: Yes Hospitalized since last visit: No Patient Has Alerts: Yes Implantable device outside of the clinic excluding No Patient Alerts: Patient on Blood Thinner cellular tissue based products placed in the center diabetic since last visit: Has Dressing in Place as Prescribed: Yes Has Compression in Place as Prescribed: Yes Pain Present Now: No Electronic Signature(s) Signed: 04/29/2023 11:58:03 AM By: Yevonne Pax RN Entered By: Yevonne Pax on 04/25/2023 12:19:29 -------------------------------------------------------------------------------- Clinic Level of Care Assessment Details Patient Name: Date of Service: Ryan [name], Ryan [name]. 04/25/2023 3:15 PM Medical Record Number: [id number] Patient Account Number: [id number] Date of Birth/Sex: Treating RN: 06/18/1971 (52 y.o. [name] Primary Care Kailan Carmen: [name] Other Clinician: Referring Daysean Tinkham: Treating Olivene Cookston/Extender: RO BSO N, MICHA EL Mallie Mussel in  Treatment: 1 Clinic Level of Care Assessment Items TOOL 1 Quantity Score []  - 0 Use when EandM and Procedure is performed on INITIAL visit ASSESSMENTS - Nursing Assessment / Reassessment []  - 0 General Physical Exam (combine w/ comprehensive assessment (listed just below) when performed on new pt. evals) []  - 0 Comprehensive Assessment (HX, ROS, Risk Assessments, Wounds Hx, etc.) Birkel, Ian [name] ([id number]) [id number] Page 2 of 11 ASSESSMENTS - Wound and Skin Assessment / Reassessment []  - 0 Dermatologic / Skin Assessment (not related to wound area) ASSESSMENTS - Ostomy and/or Continence Assessment and Care []  - 0 Incontinence Assessment and Management []  - 0 Ostomy Care Assessment and Management (repouching, etc.) PROCESS - Coordination of Care []  - 0 Simple Patient / Family Education for ongoing care []  - 0 Complex (extensive) Patient / Family Education for ongoing care []  - 0 Staff obtains Chiropractor, Records, T Results / Process Orders est []  - 0 Staff telephones HHA, Nursing Homes / Clarify orders / etc []  - 0 Routine Transfer to another Facility (non-emergent condition) []  - 0 Routine Hospital Admission (non-emergent condition) []  - 0 New Admissions / Manufacturing engineer / Ordering NPWT Apligraf, etc. , []  - 0 Emergency Hospital Admission (emergent condition) PROCESS - Special Needs []  - 0 Pediatric / Minor Patient Management []  - 0 Isolation Patient Management []  - 0 Hearing / Language / Visual special needs []  - 0 Assessment of Community assistance (transportation, D/C planning, etc.) []  - 0 Additional assistance / Altered mentation []  - 0 Support Surface(s) Assessment (bed, cushion, seat, etc.) INTERVENTIONS - Miscellaneous []  - 0 External ear exam []  - 0 Patient Transfer (multiple staff / Nurse, adult / Similar devices) []  - 0 Simple Staple / Suture removal (25 or less) []  - 0 Complex Staple / Suture removal (26 or  more) []  - 0 Hypo/Hyperglycemic Management (do not check if billed separately) []  - 0 Ankle / Brachial Index (ABI) - do  not check if billed separately Has the patient been seen at the hospital within the last three years: Yes Total Score: 0 Level Of Care: ____ Electronic Signature(s) Signed: 04/29/2023 11:58:03 AM By: Yevonne Pax RN Entered By: Yevonne Pax on 04/25/2023 12:51:52 -------------------------------------------------------------------------------- Compression Therapy Details Patient Name: Date of Service: Ryan [name]. 04/25/2023 3:15 PM Medical Record Number: [id number] Patient Account Number: [id number] Date of Birth/Sex: Treating RN: [birth year] (52 y.o. [name] Primary Care Nicola Heinemann: [name] Other Clinician: Referring Natonya Finstad: Treating [name]/Extender: RO BSO Dorris Carnes, MICHA EL Kaleab, Berley, [place] [name] ([id number]) [id number] Page 3 of 11 Weeks in Treatment: 1 Compression Therapy Performed for Wound Assessment: Wound #1 Left,Medial Lower Leg Performed By: Clinician Yevonne Pax, RN Compression Type: Double Layer Post Procedure Diagnosis Same as Pre-procedure Electronic Signature(s) Signed: 04/29/2023 11:58:03 AM By: Yevonne Pax RN Entered By: Yevonne Pax on 04/25/2023 12:38:19 -------------------------------------------------------------------------------- Compression Therapy Details Patient Name: Date of Service: Ryan [name], Ryan [name]. 04/25/2023 3:15 PM Medical Record Number: [id number] Patient Account Number: [id number] Date of Birth/Sex: Treating RN: 03-21-1971 (52 y.o. [name] Primary Care Javiana Anwar: [name] Other Clinician: Referring Edan Juday: Treating Tsuneo Faison/Extender: RO BSO N, MICHA EL Mallie Mussel in Treatment: 1 Compression Therapy Performed for Wound Assessment: Wound #2 Left,Medial Foot Performed By: Clinician Yevonne Pax, RN Compression Type: Double Layer Post  Procedure Diagnosis Same as Pre-procedure Electronic Signature(s) Signed: 04/29/2023 11:58:03 AM By: Yevonne Pax RN Entered By: Yevonne Pax on 04/25/2023 12:38:38 -------------------------------------------------------------------------------- Encounter Discharge Information Details Patient Name: Date of Service: Ryan [name]. 04/25/2023 3:15 PM Medical Record Number: [id number] Patient Account Number: [id number] Date of Birth/Sex: Treating RN: [birth year] (52 y.o. [name] Primary Care Richerd Grime: [name] Other Clinician: Referring Simya Tercero: Treating Esterlene Atiyeh/Extender: RO BSO N, MICHA EL Mallie Mussel in Treatment: 1 Encounter Discharge Information Items Discharge Condition: Stable Ambulatory Status: Ambulatory Discharge Destination: Home Transportation: Private [address] [place], RILL [name] ([id number]) [id number] Page 4 of 11 Accompanied By: self Schedule Follow-up Appointment: Yes Clinical Summary of Care: Electronic Signature(s) Signed: 04/25/2023 3:54:01 PM By: Yevonne Pax RN Entered By: Yevonne Pax on 04/25/2023 12:54:01 -------------------------------------------------------------------------------- Lower Extremity Assessment Details Patient Name: Date of Service: Ryan [name], Ryan [name]. 04/25/2023 3:15 PM Medical Record Number: [id number] Patient Account Number: [id number] Date of Birth/Sex: Treating RN: 12/04/1970 (52 y.o. [name]) Yevonne Pax Primary Care Manasseh Pittsley: [name] Other Clinician: Referring Leanza Shepperson: Treating Randen Kauth/Extender: RO BSO N, MICHA EL Jones Skene, Kellie Shropshire in Treatment: 1 Edema Assessment Assessed: [Left: No] [Right: No] Edema: [Left: Ye] [Right: s] Calf Left: Right: Point of Measurement: 35 cm From Medial Instep 52 cm Ankle Left: Right: Point of Measurement: 10 cm From Medial Instep 33 cm Vascular Assessment Pulses: Dorsalis Pedis Palpable: [Left:Yes] Doppler Audible:  [Left:Yes] Extremity colors, hair growth, and conditions: Extremity Color: [Left:Hyperpigmented] Hair Growth on Extremity: [Left:No] Temperature of Extremity: [Left:Warm] Capillary Refill: [Left:< 3 seconds] Dependent Rubor: [Left:No] Blanched when Elevated: [Left:No Yes] Toe Nail Assessment Left: Right: Thick: Yes Discolored: Yes Deformed: Yes Improper Length and Hygiene: Yes Electronic Signature(s) Signed: 04/29/2023 11:58:03 AM By: Yevonne Pax RN Entered By: Yevonne Pax on 04/25/2023 12:29:30 Warmack, Bing Neighbors ([id number]) [id number] Page 5 of 11 -------------------------------------------------------------------------------- Multi Wound Chart Details Patient Name: Date of Service: Ryan [name], Ryan [name]. 04/25/2023 3:15 PM Medical Record Number: [id number] Patient Account Number: [id number] Date of Birth/Sex: Treating RN: [birth year] (52 y.o. [name] Primary Care Tahj Njoku: [name] Other Clinician: Referring Areon Cocuzza: Treating Kaylen Motl/Extender: RO BSO Dorris Carnes, MICHA  EL Jones Skene, Karen Weeks in Treatment: 1 Vital Signs Height(in): 67 Pulse(bpm): 88 Weight(lbs): 338 Blood Pressure(mmHg): 141/97 Body Mass Index(BMI): 52.9 Temperature(F): 97.8 Respiratory Rate(breaths/min): 18 [1:Photos:] [N/[name]:N/[name]] Left, Medial Lower Leg Left, Medial Foot N/[name] Wound Location: Gradually Appeared Gradually Appeared N/[name] Wounding Event: Diabetic Wound/Ulcer of the Lower Diabetic Wound/Ulcer of the Lower N/[name] Primary Etiology: Extremity Extremity Arrhythmia, Congestive Heart Failure, Arrhythmia, Congestive Heart Failure, N/[name] Comorbid History: Hypertension, Peripheral Venous Hypertension, Peripheral Venous Disease, Type II Diabetes Disease, Type II Diabetes 02/28/2023 02/28/2023 N/[name] Date Acquired: 1 1 N/[name] Weeks of Treatment: Open Open N/[name] Wound Status: No No N/[name] Wound Recurrence: 2x2.5x0.3 1.4x2x0.2 N/[name] Measurements L x W x D (cm) 3.927 2.199 N/[name] [name] (cm)  : rea 1.178 0.44 N/[name] Volume (cm) : 0.00% 13.90% N/[name] % Reduction in [name] rea: 0.00% 13.90% N/[name] % Reduction in Volume: Grade 2 Grade 1 N/[name] Classification: Medium Medium N/[name] Exudate [name] mount: Serosanguineous Serosanguineous N/[name] Exudate Type: red, brown red, brown N/[name] Exudate Color: Medium (34-66%) Small (1-33%) N/[name] Granulation [name] mount: Pink Pink N/[name] Granulation Quality: Medium (34-66%) Large (67-100%) N/[name] Necrotic [name] mount: Fat Layer (Subcutaneous Tissue): Yes Fat Layer (Subcutaneous Tissue): Yes N/[name] Exposed Structures: Fascia: No Fascia: No Tendon: No Tendon: No Muscle: No Muscle: No Joint: No Joint: No Bone: No Bone: No None None N/[name] Epithelialization: Treatment Notes Electronic Signature(s) Signed: 04/29/2023 11:58:03 AM By: Yevonne Pax RN Entered By: Yevonne Pax on 04/25/2023 12:29:36 Haver, Bing Neighbors ([id number]) [id number] Page 6 of 11 -------------------------------------------------------------------------------- Multi-Disciplinary Care Plan Details Patient Name: Date of Service: Ryan [name], Ryan [name]. 04/25/2023 3:15 PM Medical Record Number: [id number] Patient Account Number: [id number] Date of Birth/Sex: Treating RN: September 20, 1970 (52 y.o. [name]) Yevonne Pax Primary Care Mathayus Stanbery: [name] Other Clinician: Referring Mallissa Lorenzen: Treating Carlisa Eble/Extender: RO BSO N, MICHA EL Mallie Mussel in Treatment: 1 Active Inactive Necrotic Tissue Nursing Diagnoses: Knowledge deficit related to management of necrotic/devitalized tissue Goals: Patient/caregiver will verbalize understanding of reason and process for debridement of necrotic tissue Date Initiated: 04/18/2023 Target Resolution Date: 05/19/2023 Goal Status: Active Interventions: Assess patient pain level pre-, during and post procedure and prior to discharge Notes: Wound/Skin Impairment Nursing Diagnoses: Knowledge deficit related to ulceration/compromised skin  integrity Goals: Patient/caregiver will verbalize understanding of skin care regimen Date Initiated: 04/18/2023 Target Resolution Date: 05/19/2023 Goal Status: Active Ulcer/skin breakdown will have [name] volume reduction of 30% by week 4 Date Initiated: 04/18/2023 Target Resolution Date: 05/19/2023 Goal Status: Active Ulcer/skin breakdown will have [name] volume reduction of 50% by week 8 Date Initiated: 04/18/2023 Target Resolution Date: 06/18/2023 Goal Status: Active Ulcer/skin breakdown will have [name] volume reduction of 80% by week 12 Date Initiated: 04/18/2023 Target Resolution Date: 07/19/2023 Goal Status: Active Ulcer/skin breakdown will heal within 14 weeks Date Initiated: 04/18/2023 Target Resolution Date: 08/18/2023 Goal Status: Active Interventions: Assess patient/caregiver ability to obtain necessary supplies Assess patient/caregiver ability to perform ulcer/skin care regimen upon admission and as needed Assess ulceration(s) every visit Notes: Electronic Signature(s) Signed: 04/29/2023 11:58:03 AM By: Yevonne Pax RN Entered By: Yevonne Pax on 04/25/2023 12:29:51 Jalomo, Bing Neighbors ([id number]) [id number] Page 7 of 11 -------------------------------------------------------------------------------- Pain Assessment Details Patient Name: Date of Service: Ryan [name], Ryan [name]. 04/25/2023 3:15 PM Medical Record Number: [id number] Patient Account Number: [id number] Date of Birth/Sex: Treating RN: [birth year] (52 y.o. [name] Primary Care Helmuth Recupero: [name] Other Clinician: Referring Kalyn Hofstra: Treating Canaan Holzer/Extender: RO BSO N, MICHA EL Jones Skene, Kellie Shropshire in Treatment: 1 Active Problems Location of Pain Severity and  Description of Pain Patient Has Paino No Site Locations Pain Management and Medication Current Pain Management: Electronic Signature(s) Signed: 04/29/2023 11:58:03 AM By: Yevonne Pax RN Entered By: Yevonne Pax on 04/25/2023  12:19:53 -------------------------------------------------------------------------------- Patient/Caregiver Education Details Patient Name: Date of Service: Ryan [name] 8/26/2024andnbsp3:15 PM Medical Record Number: [id number] Patient Account Number: [id number] Date of Birth/Gender: Treating RN: [birth year] (52 y.o. [name] Primary Care Physician: [name] Other Clinician: Referring Physician: Treating Physician/Extender: RO BSO N, MICHA EL Jones Skene, Kellie Shropshire in Treatment: 1 PAX, MONNIER ([id number]) [id number] Page 8 of 11 Education Assessment Education Provided To: Patient Education Topics Provided Wound/Skin Impairment: Handouts: Caring for Your Ulcer Methods: Explain/Verbal Responses: State content correctly Electronic Signature(s) Signed: 04/29/2023 11:58:03 AM By: Yevonne Pax RN Entered By: Yevonne Pax on 04/25/2023 12:30:04 -------------------------------------------------------------------------------- Wound Assessment Details Patient Name: Date of Service: Ryan [name]. 04/25/2023 3:15 PM Medical Record Number: [id number] Patient Account Number: [id number] Date of Birth/Sex: Treating RN: 08-13-1971 (52 y.o. [name]) Yevonne Pax Primary Care Winston Sobczyk: [name] Other Clinician: Referring Para Cossey: Treating Althea Backs/Extender: RO BSO N, MICHA EL Mallie Mussel in Treatment: 1 Wound Status Wound Number: 1 Primary Diabetic Wound/Ulcer of the Lower Extremity Etiology: Wound Location: Left, Medial Lower Leg Wound Open Wounding Event: Gradually Appeared Status: Date Acquired: 02/28/2023 Comorbid Arrhythmia, Congestive Heart Failure, Hypertension, Peripheral Weeks Of Treatment: 1 History: Venous Disease, Type II Diabetes Clustered Wound: No Photos Wound Measurements Length: (cm) 2 Width: (cm) 2.5 Depth: (cm) 0.3 Area: (cm) 3.927 Volume: (cm) 1.178 % Reduction in Area: 0% % Reduction in Volume:  0% Epithelialization: None Tunneling: No Undermining: No Wound Description Classification: Grade 2 Exudate Amount: Medium Exudate Type: Serosanguineous Ryan [name], Ryan [name] ([id number]) Exudate Color: red, brown Foul Odor After Cleansing: No Slough/Fibrino Yes [phone].pdf Page 9 of 11 Wound Bed Granulation Amount: Medium (34-66%) Exposed Structure Granulation Quality: Pink Fascia Exposed: No Necrotic Amount: Medium (34-66%) Fat Layer (Subcutaneous Tissue) Exposed: Yes Necrotic Quality: Adherent Slough Tendon Exposed: No Muscle Exposed: No Joint Exposed: No Bone Exposed: No Treatment Notes Wound #1 (Lower Leg) Wound Laterality: Left, Medial Cleanser Soap and Water Discharge Instruction: Gently cleanse wound with antibacterial soap, rinse and pat dry prior to dressing wounds Wound Cleanser Discharge Instruction: Wash your hands with soap and water. Remove old dressing, discard into plastic bag and place into trash. Cleanse the wound with Wound Cleanser prior to applying [name] clean dressing using gauze sponges, not tissues or cotton balls. Do not scrub or use excessive force. Pat dry using gauze sponges, not tissue or cotton balls. Peri-Wound Care Topical Primary Dressing Hydrofera Blue Ready Transfer Foam, 2.5x2.5 (in/in) Discharge Instruction: Apply Hydrofera Blue Ready to wound bed as directed Secondary Dressing ABD Pad 5x9 (in/in) Discharge Instruction: Cover with ABD pad Secured With Compression Wrap Urgo K2 Lite, two layer compression system, large Compression Stockings Add-Ons Electronic Signature(s) Signed: 04/29/2023 11:58:03 AM By: Yevonne Pax RN Entered By: Yevonne Pax on 04/25/2023 12:28:02 -------------------------------------------------------------------------------- Wound Assessment Details Patient Name: Date of Service: Ryan [name]. 04/25/2023 3:15 PM Medical Record Number: [id number] Patient Account Number: [id number] Date of  Birth/Sex: Treating RN: 1971-08-25 (52 y.o. [name] Primary Care Aaditya Letizia: [name] Other Clinician: Referring Brelyn Woehl: Treating Shanine Kreiger/Extender: RO BSO N, MICHA EL Jones Skene, Karen Weeks in Treatment: 1 Wound Status Wound Number: 2 Primary Diabetic Wound/Ulcer of the Lower Extremity Etiology: Wound Location: Left, Medial Foot Wound Open Wounding Event: Gradually Appeared Status: Date Acquired: 02/28/2023 Comorbid Arrhythmia, Congestive Heart Failure,  Hypertension, Peripheral Weeks Of Treatment: 1 Ryan [name], Ryan [name] ([id number]) [phone].pdf Page 10 of 11 Weeks Of Treatment: 1 History: Venous Disease, Type II Diabetes Clustered Wound: No Photos Wound Measurements Length: (cm) 1.4 Width: (cm) 2 Depth: (cm) 0.2 Area: (cm) 2.199 Volume: (cm) 0.44 % Reduction in Area: 13.9% % Reduction in Volume: 13.9% Epithelialization: None Tunneling: No Undermining: No Wound Description Classification: Grade 1 Exudate Amount: Medium Exudate Type: Serosanguineous Exudate Color: red, brown Foul Odor After Cleansing: No Slough/Fibrino Yes Wound Bed Granulation Amount: Small (1-33%) Exposed Structure Granulation Quality: Pink Fascia Exposed: No Necrotic Amount: Large (67-100%) Fat Layer (Subcutaneous Tissue) Exposed: Yes Necrotic Quality: Adherent Slough Tendon Exposed: No Muscle Exposed: No Joint Exposed: No Bone Exposed: No Treatment Notes Wound #2 (Foot) Wound Laterality: Left, Medial Cleanser Soap and Water Discharge Instruction: Gently cleanse wound with antibacterial soap, rinse and pat dry prior to dressing wounds Wound Cleanser Discharge Instruction: Wash your hands with soap and water. Remove old dressing, discard into plastic bag and place into trash. Cleanse the wound with Wound Cleanser prior to applying [name] clean dressing using gauze sponges, not tissues or cotton balls. Do not scrub or use excessive force. Pat dry using gauze  sponges, not tissue or cotton balls. Peri-Wound Care Topical Primary Dressing Hydrofera Blue Ready Transfer Foam, 2.5x2.5 (in/in) Discharge Instruction: Apply Hydrofera Blue Ready to wound bed as directed Secondary Dressing ABD Pad 5x9 (in/in) Discharge Instruction: Cover with ABD pad Secured With Compression Wrap Urgo K2 Lite, two layer compression system, large Compression Stockings Add-Ons Electronic Signature(s) Signed: 04/29/2023 11:58:03 AM By: Yevonne Pax RN Entered By: Yevonne Pax on 04/25/2023 12:28:29 Gellner, Bing Neighbors ([id number]) [id number] Page 11 of 11 -------------------------------------------------------------------------------- Vitals Details Patient Name: Date of Service: Ryan [name], ZUBIA [name]. 04/25/2023 3:15 PM Medical Record Number: [id number] Patient Account Number: [id number] Date of Birth/Sex: Treating RN: 06/01/1971 (52 y.o. [name]) Yevonne Pax Primary Care Venecia Mehl: [name] Other Clinician: Referring Tamila Gaulin: Treating Mccartney Chuba/Extender: RO BSO N, MICHA EL Jones Skene, Kellie Shropshire in Treatment: 1 Vital Signs Time Taken: 15:15 Temperature (F): 97.8 Height (in): 67 Pulse (bpm): 88 Weight (lbs): 338 Respiratory Rate (breaths/min): 18 Body Mass Index (BMI): 52.9 Blood Pressure (mmHg): 141/97 Reference Range: 80 - 120 mg / dl Electronic Signature(s) Signed: 04/29/2023 11:58:03 AM By: Yevonne Pax RN Entered By: Yevonne Pax on 04/25/2023 12:19:47

## 2023-04-29 NOTE — Progress Notes (Signed)
STERLING, KEARNEY ([id number]) [id number] Page 1 of 11 Visit Report for 04/18/2023 Chief Complaint Document Details Patient Name: Date of Service: Ryan [name], Ryan [name]. 04/18/2023 9:30 [name] Medical Record Number: [id number] Patient Account Number: [id number] Date of Birth/Sex: Treating RN: 07-Apr-1971 (52 y.o. [name]) Ryan [name] Primary Care Provider: Larae [name] Other Clinician: Referring Provider: Treating Provider/Extender: Ryan [name] in Treatment: 0 Information Obtained from: Patient Chief Complaint Left leg and left foot ulcers Electronic Signature(s) Signed: 04/18/2023 10:31:23 AM By: Ryan Derry PA-C Entered By: Ryan [name] on 04/18/2023 07:31:23 -------------------------------------------------------------------------------- Debridement Details Patient Name: Date of Service: Ryan [name]. 04/18/2023 9:30 [name] Medical Record Number: [id number] Patient Account Number: [id number] Date of Birth/Sex: Treating RN: 1971-07-28 (52 y.o. Ryan [name] Primary Care Provider: Larae [name] Other Clinician: Referring Provider: Treating Provider/Extender: Ryan [name] in Treatment: 0 Debridement Performed for Assessment: Wound #1 Left,Medial Lower Leg Performed By: Physician Ryan Derry, PA-C Debridement Type: Debridement Severity of Tissue Pre Debridement: Fat layer exposed Level of Consciousness (Pre-procedure): Awake and Alert Pre-procedure Verification/Time Out Yes - 10:35 Taken: Start Time: 10:35 Percent of Wound Bed Debrided: 100% T Area Debrided (cm): otal 3.92 Tissue and other material debrided: Viable, Non-Viable, Slough, Subcutaneous, Skin: Dermis , Skin: Epidermis, Slough Level: Skin/Subcutaneous Tissue Debridement Description: Excisional Instrument: Curette Bleeding: Moderate Hemostasis Achieved: Pressure End Time: 10:39 Procedural Pain: 0 Post Procedural Pain: 0 Umbach, Ryan [name] ([id number])  [id number] Page 2 of 11 Response to Treatment: Procedure was tolerated well Level of Consciousness (Post- Awake and Alert procedure): Post Debridement Measurements of Total Wound Length: (cm) 2 Width: (cm) 2.5 Depth: (cm) 0.3 Volume: (cm) 1.178 Character of Wound/Ulcer Post Debridement: Improved Severity of Tissue Post Debridement: Fat layer exposed Post Procedure Diagnosis Same as Pre-procedure Electronic Signature(s) Signed: 04/18/2023 5:35:17 PM By: Ryan Derry PA-C Signed: 04/29/2023 11:59:03 AM By: Ryan Pax RN Entered By: Ryan [name] on 04/18/2023 07:40:42 -------------------------------------------------------------------------------- Debridement Details Patient Name: Date of Service: Ryan [name]. 04/18/2023 9:30 [name] Medical Record Number: [id number] Patient Account Number: [id number] Date of Birth/Sex: Treating RN: Dec 15, 1970 (51 y.o. Ryan [name] Primary Care Provider: Larae [name] Other Clinician: Referring Provider: Treating Provider/Extender: Ryan [name] in Treatment: 0 Debridement Performed for Assessment: Wound #2 Left,Medial Foot Performed By: Physician Ryan Derry, PA-C Debridement Type: Debridement Severity of Tissue Pre Debridement: Fat layer exposed Level of Consciousness (Pre-procedure): Awake and Alert Pre-procedure Verification/Time Out Yes - 10:35 Taken: Start Time: 10:35 Percent of Wound Bed Debrided: 100% T Area Debrided (cm): otal 2.55 Tissue and other material debrided: Viable, Non-Viable, Slough, Subcutaneous, Skin: Dermis , Skin: Epidermis, Slough Level: Skin/Subcutaneous Tissue Debridement Description: Excisional Instrument: Curette Bleeding: Moderate Hemostasis Achieved: Pressure End Time: 10:39 Procedural Pain: 0 Post Procedural Pain: 0 Response to Treatment: Procedure was tolerated well Level of Consciousness (Post- Awake and Alert procedure): Post Debridement  Measurements of Total Wound Length: (cm) 1.3 Width: (cm) 2.5 Depth: (cm) 0.2 Volume: (cm) 0.511 Character of Wound/Ulcer Post Debridement: Improved Severity of Tissue Post Debridement: Fat layer exposed Post Procedure Diagnosis Same as Pre-procedure Ryan [name], Ryan [name] ([id number]) [id number] Page 3 of 11 Electronic Signature(s) Signed: 04/18/2023 5:35:17 PM By: Ryan Derry PA-C Signed: 04/29/2023 11:59:03 AM By: Ryan Pax RN Entered By: Ryan [name] on 04/18/2023 07:41:18 -------------------------------------------------------------------------------- HPI Details Patient Name: Date of Service: Ryan [name]. 04/18/2023 9:30 [name] Medical Record Number: [id number] Patient Account Number: [id number] Date of Birth/Sex: Treating RN: [birth year] (52 y.o. M) [name],  Ryan [name] Primary Care Provider: Larae [name] Other Clinician: Referring Provider: Treating Provider/Extender: Ryan [name] in Treatment: 0 History of Present Illness Chronic/Inactive Conditions Condition 1: 04-18-2023 patient screening ABI today was 0.83 and this was on the left. With that being said he had previous ABIs done formally on 06-25-2021 with [name] right ABI of 1.12 and [name] TBI of 1.03 on the left she had an ABI of 1.12 with [name] TBI of 0.98. HPI Description: 04-18-2023 upon evaluation today patient presents for initial inspection here in our clinic some concerning issues he is having with his left proximal foot as well as his left lower extremity. This actually appears to be more of [name] situation to be honest. He does have chronic lower extremity swelling secondary to what appears to be venous stasis he has not really had any wounds significantly before and tells me that it has been [name] probably even 12 or so since he has last used his lymphedema pumps. He is not even sure if they are actually functioning anymore or not. With that being said this is 1  thing that I did want him to look into. He is on [name] fluid pill and he does not wear compression socks on [name] regular basis although I definitely think that something that he also really needs to be doing to be honest. He does not have any injury that occurred to cause these wounds they more or less spontaneously occurred as [name] result of what appears to be excessive swelling. Patient does have [name] history of diabetes mellitus type 2, congestive heart failure, lymphedema, venous hypertension, hypertension, peripheral vascular disease, and chronic kidney disease stage III. Electronic Signature(s) Signed: 04/18/2023 5:33:12 PM By: Ryan Derry PA-C Entered By: Ryan [name] on 04/18/2023 14:33:12 -------------------------------------------------------------------------------- Physical Exam Details Patient Name: Date of Service: Ryan [name], Ryan [name]. 04/18/2023 9:30 [name] Medical Record Number: [id number] Patient Account Number: [id number] Date of Birth/Sex: Treating RN: 1971/05/16 (52 y.o. Ryan [name] Primary Care Provider: Larae [name] Other Clinician: Referring Provider: Treating Provider/Extender: Ryan [name] in Treatment: 0 Constitutional sitting or standing blood pressure is within target range for patient.. pulse regular and within target range for patient.[name] respirations regular, non-labored and within target range for patient.[name] temperature within target range for patient.. Obese and well-hydrated in no acute distress. Ryan [name], Ryan [name] ([id number]) [id number] Page 4 of 11 Eyes conjunctiva clear no eyelid edema noted. pupils equal round and reactive to light and accommodation. Ears, Nose, Mouth, and Throat no gross abnormality of ear auricles or external auditory canals. normal hearing noted during conversation. mucus membranes moist. Respiratory normal breathing without difficulty. Cardiovascular 1+ dorsalis pedis/posterior tibialis pulses.  Patient has bilateral nonpitting lymphedema.. Musculoskeletal normal gait and posture. no significant deformity or arthritic changes, no loss or range of motion, no clubbing. Psychiatric this patient is able to make decisions and demonstrates good insight into disease process. Alert and Oriented x 3. pleasant and cooperative. Notes Upon inspection patient's wounds actually appear to be not too significant although there is [name] little bit of depth here I think that this point is more of an ulceration secondary to uncontrolled lower extremity swelling and lymphedema. I discussed with the patient that this is something that is can have to be much better control going forward so that things do not get worse. He voiced understanding. This is can include him wearing compression regularly, elevate his legs, using his lymphedema pumps, and subsequently also using  compression stockings of 1 type or another. Electronic Signature(s) Signed: 04/18/2023 5:34:02 PM By: Ryan Derry PA-C Entered By: Ryan [name] on 04/18/2023 14:34:02 -------------------------------------------------------------------------------- Physician Orders Details Patient Name: Date of Service: Ryan [name]. 04/18/2023 9:30 [name] Medical Record Number: [id number] Patient Account Number: [id number] Date of Birth/Sex: Treating RN: 11-04-1970 (52 y.o. [name]) Ryan [name] Primary Care Provider: Larae [name] Other Clinician: Referring Provider: Treating Provider/Extender: Ryan [name] in Treatment: 0 Verbal / Phone Orders: No Diagnosis Coding ICD-10 Coding Code Description E11.622 Type 2 diabetes mellitus with other skin ulcer L97.822 Non-pressure chronic ulcer of other part of left lower leg with fat layer exposed E11.621 Type 2 diabetes mellitus with foot ulcer L97.522 Non-pressure chronic ulcer of other part of left foot with fat layer exposed I50.42 Chronic combined systolic (congestive) and diastolic  (congestive) heart failure I89.0 Lymphedema, not elsewhere classified 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 Bathing/ Shower/ Hygiene May shower with wound dressing protected with water repellent cover or cast protector. Ryan [name], Ryan [name] ([id number]) [id number] Page 5 of 11 Anesthetic (Use 'Patient Medications' Section for Anesthetic Order Entry) Lidocaine applied to wound bed Edema Control - Lymphedema / Segmental Compressive Device / Other Elevate, Exercise Daily and [name] void Standing for Long Periods of Time. Elevate legs to the level of the heart and pump ankles as often as possible Elevate leg(s) parallel to the floor when sitting. Wound Treatment Wound #1 - Lower Leg Wound Laterality: Left, Medial Cleanser: Soap and Water 1 x Per Week/30 Days Discharge Instructions: Gently cleanse wound with antibacterial soap, rinse and pat dry prior to dressing wounds Cleanser: Wound Cleanser 1 x Per Week/30 Days Discharge Instructions: Wash your hands with soap and water. Remove old dressing, discard into plastic bag and place into trash. Cleanse the wound with Wound Cleanser prior to applying [name] clean dressing using gauze sponges, not tissues or cotton balls. Do not scrub or use excessive force. Pat dry using gauze sponges, not tissue or cotton balls. Prim Dressing: Hydrofera Blue Ready Transfer Foam, 2.5x2.5 (in/in) 1 x Per Week/30 Days ary Discharge Instructions: Apply Hydrofera Blue Ready to wound bed as directed Secondary Dressing: ABD Pad 5x9 (in/in) 1 x Per Week/30 Days Discharge Instructions: Cover with ABD pad Compression Wrap: Urgo K2 Lite, two layer compression system, large 1 x Per Week/30 Days Wound #2 -  Foot Wound Laterality: Left, Medial Cleanser: Soap and Water 1 x Per Week/30 Days Discharge Instructions: Gently cleanse wound with antibacterial soap, rinse and pat dry prior to dressing wounds Cleanser: Wound Cleanser 1 x Per Week/30 Days Discharge Instructions: Wash your hands with soap and water. Remove old dressing, discard into plastic bag and place into trash. Cleanse the wound with Wound Cleanser prior to applying [name] clean dressing using gauze sponges, not tissues or cotton balls. Do not scrub or use excessive force. Pat dry using gauze sponges, not tissue or cotton balls. Prim Dressing: Hydrofera Blue Ready Transfer Foam, 2.5x2.5 (in/in) 1 x Per Week/30 Days ary Discharge Instructions: Apply Hydrofera Blue Ready to wound bed as directed Secondary Dressing: ABD Pad 5x9 (in/in) 1 x Per Week/30 Days Discharge Instructions: Cover with ABD pad Compression Wrap: Urgo K2 Lite, two layer compression system, large 1 x Per Week/30 Days Electronic Signature(s) Signed: 04/18/2023 5:35:17 PM By: Ryan Derry PA-C Signed:  04/29/2023 11:59:03 AM By: Ryan Pax RN Entered By: Ryan [name] on 04/18/2023 07:38:22 -------------------------------------------------------------------------------- Problem List Details Patient Name: Date of Service: Ryan [name]. 04/18/2023 9:30 [name] Medical Record Number: [id number] Patient Account Number: [id number] Date of Birth/Sex: Treating RN: 26-Mar-1971 (52 y.o. 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/19/2024 No  Yes E11.621 Type 2 diabetes mellitus with foot ulcer 04/18/2023 No Yes L97.522 Non-pressure chronic ulcer of other part of left foot with fat layer exposed 04/18/2023 No Yes I50.42 Chronic combined systolic (congestive) and diastolic (congestive) heart failure 04/18/2023 No Yes I89.0 Lymphedema, not elsewhere classified 04/18/2023 No Yes I87.332 Chronic venous hypertension (idiopathic) with ulcer and inflammation of left 04/18/2023 No Yes lower extremity I10 Essential (primary) hypertension 04/18/2023 No Yes I73.89 Other specified peripheral vascular diseases 04/18/2023 No Yes N18.30 Chronic kidney disease, stage 3 unspecified 04/18/2023 No Yes Inactive Problems Resolved Problems Electronic Signature(s) Signed: 04/18/2023 10:31:10 AM By: Ryan Derry PA-C Entered By: Ryan [name] on 04/18/2023 07:31:10 -------------------------------------------------------------------------------- Progress Note Details Patient Name: Date of Service: Ryan [name]. 04/18/2023 9:30 [name] Medical Record Number: [id number] Patient Account Number: [id number] Date of Birth/Sex: Treating RN: 19-Oct-1970 (52 y.o. Ryan [name] Primary Care Provider: Larae [name] Other Clinician: Referring Provider: Treating Provider/Extender: Ryan [name] in Treatment: 0 Subjective Ryan [name], Ryan [name] ([id number]) [id number] Page 7 of 11 Chief Complaint Information obtained from Patient Left leg and left foot ulcers History of Present Illness (HPI) Chronic/Inactive Condition: 04-18-2023 patient screening ABI today was 0.83 and this was on the left. With that being said he had previous ABIs done formally on 06-25-2021 with [name] right ABI of 1.12 and [name] TBI of 1.03 on the left she had an ABI of 1.12 with [name] TBI of 0.98. 04-18-2023 upon evaluation today patient presents for initial inspection here in our clinic some concerning issues he is having with his left proximal foot as well as his  left lower extremity. This actually appears to be more of [name] situation to be honest. He does have chronic lower extremity swelling secondary to what appears to be venous stasis he has not really had any wounds significantly before and tells me that it has been [name] probably even 12 or so since he has last used his lymphedema pumps. He is not even sure if they are actually functioning anymore or not. With that being said this is 1 thing that I did want him to look into. He is on [name] fluid pill and he does not wear compression socks on [name] regular basis although I definitely think that something that he also really needs to be doing to be honest. He does not have any injury that occurred to cause these wounds they more or less spontaneously occurred as [name] result of what appears to be excessive swelling. Patient does have [name] history of diabetes mellitus type 2, congestive heart failure, lymphedema, venous hypertension, hypertension, peripheral vascular disease, and chronic kidney disease stage III. Patient History Information obtained from Patient. Allergies metrizamide, clindamycin, lincomycin, Sulfa (Sulfonamide Antibiotics) Social History Never smoker, Marital Status - Married, Alcohol Use - Never,  Drug Use - No History, Caffeine Use - Moderate. Medical History Cardiovascular Patient has history of Arrhythmia, Congestive Heart Failure, Hypertension, Peripheral Venous Disease Endocrine Patient has history of Type II Diabetes Patient is treated with Insulin. Review of Systems (ROS) Integumentary (Skin) Complains or has symptoms of Wounds, Swelling. Objective Constitutional sitting or standing blood pressure is within target range for patient.. pulse regular and within target range for patient.[name] respirations regular, non-labored and within target range for patient.[name] temperature within target range for patient.. Obese and well-hydrated in no acute distress. Vitals Time  Taken: 9:53 AM, Height: 67 in, Source: Stated, Weight: 338 lbs, Source: Stated, BMI: 52.9, Temperature: 97.8 F, Pulse: 89 bpm, Respiratory Rate: 18 breaths/min, Blood Pressure: 132/81 mmHg. Eyes conjunctiva clear no eyelid edema noted. pupils equal round and reactive to light and accommodation. Ears, Nose, Mouth, and Throat no gross abnormality of ear auricles or external auditory canals. normal hearing noted during conversation. mucus membranes moist. Respiratory normal breathing without difficulty. Cardiovascular 1+ dorsalis pedis/posterior tibialis pulses. Patient has bilateral nonpitting lymphedema.. Musculoskeletal normal gait and posture. no significant deformity or arthritic changes, no loss or range of motion, no clubbing. Psychiatric this patient is able to make decisions and demonstrates good insight into disease process. Alert and Oriented x 3. pleasant and cooperative. General Notes: Upon inspection patient's wounds actually appear to be not too significant although there is [name] little bit of depth here I think that this point is more of an ulceration secondary to uncontrolled lower extremity swelling and lymphedema. I discussed with the patient that this is something that is can have to be much better control going forward so that things do not get worse. He voiced understanding. This is can include him wearing compression regularly, elevate his legs, using his lymphedema pumps, and subsequently also using compression stockings of 1 type or another. Integumentary (Hair, Skin) Wound #1 status is Open. Original cause of wound was Gradually Appeared. The date acquired was: 02/28/2023. The wound is located on the Left,Medial Lower Leg. The wound measures 2cm length x 2.5cm width x 0.3cm depth; 3.927cm^2 area and 1.178cm^3 volume. There is Fat Layer (Subcutaneous Tissue) Ryan [name], Ryan [name] ([id number]) [id number] Page 8 of 11 exposed. There is no tunneling or  undermining noted. There is [name] medium amount of serosanguineous drainage noted. There is medium (34-66%) pink granulation within the wound bed. There is [name] medium (34-66%) amount of necrotic tissue within the wound bed including Adherent Slough. Wound #2 status is Open. Original cause of wound was Gradually Appeared. The date acquired was: 02/28/2023. The wound is located on the Left,Medial Foot. The wound measures 1.3cm length x 2.5cm width x 0.2cm depth; 2.553cm^2 area and 0.511cm^3 volume. There is Fat Layer (Subcutaneous Tissue) exposed. There is no tunneling or undermining noted. There is [name] medium amount of serosanguineous drainage noted. There is small (1-33%) pink granulation within the wound bed. There is [name] large (67-100%) amount of necrotic tissue within the wound bed including Adherent Slough. 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 There was [name] Excisional Skin/Subcutaneous Tissue Debridement with [name] total area of 3.92 sq cm performed by Ryan Derry, PA-C. With the following instrument(s): Curette to remove Viable and Non-Viable tissue/material. Material removed includes Subcutaneous Tissue, Slough, Skin: Dermis, and Skin:  Epidermis. No specimens were taken. [name] time out was conducted at 10:35, prior to the start of the procedure. [name] Moderate amount of bleeding was controlled with Pressure. The procedure was tolerated well with [name] pain level of 0 throughout and [name] pain level of 0 following the procedure. Post Debridement Measurements: 2cm length x 2.5cm width x 0.3cm depth; 1.178cm^3 volume. Character of Wound/Ulcer Post Debridement is improved. Severity of Tissue Post Debridement is: Fat layer exposed. Post procedure Diagnosis Wound #1: Same as Pre-Procedure Wound #2 Pre-procedure diagnosis of Wound #2 is [name] Diabetic Wound/Ulcer of the Lower Extremity located on the Left,Medial Foot .Severity of Tissue Pre Debridement is: Fat layer exposed. There was [name] Excisional Skin/Subcutaneous Tissue Debridement with [name] total area of 2.55 sq cm performed by Ryan Derry, PA-C. With the following instrument(s): Curette to remove Viable and Non-Viable tissue/material. Material removed includes Subcutaneous Tissue, Slough, Skin: Dermis, and Skin: Epidermis. No specimens were taken. [name] time out was conducted at 10:35, prior to the start of the procedure. [name] Moderate amount of bleeding was controlled with Pressure. The procedure was tolerated well with [name] pain level of 0 throughout and [name] pain level of 0 following the procedure. Post Debridement Measurements: 1.3cm length x 2.5cm width x 0.2cm depth; 0.511cm^3 volume. Character of Wound/Ulcer Post Debridement is improved. Severity of Tissue Post Debridement is: Fat layer exposed. Post procedure Diagnosis Wound #2: Same as Pre-Procedure Plan Follow-up Appointments: Return Appointment in 1 week. Bathing/ Shower/ Hygiene: May shower with wound dressing protected with water repellent cover or cast protector. Anesthetic (Use 'Patient Medications' Section for Anesthetic Order Entry): Lidocaine applied to wound bed Edema Control - Lymphedema / Segmental Compressive Device / Other: Elevate, Exercise  Daily and Avoid Standing for Long Periods of Time. Elevate legs to the level of the heart and pump ankles as often as possible Elevate leg(s) parallel to the floor when sitting. WOUND #1: - Lower Leg Wound Laterality: Left, Medial Cleanser: Soap and Water 1 x Per Week/30 Days Discharge Instructions: Gently cleanse wound with antibacterial soap, rinse and pat dry prior to dressing wounds Cleanser: Wound Cleanser 1 x Per Week/30 Days Discharge Instructions: Wash your hands with soap and water. Remove old dressing, discard into plastic bag and place into trash. Cleanse the wound with Wound Cleanser prior to applying [name] clean dressing using gauze sponges, not tissues or cotton balls. Do not scrub or use excessive force. Pat dry using gauze sponges, not tissue or cotton balls. Prim Dressing: Hydrofera Blue Ready Transfer Foam, 2.5x2.5 (in/in) 1 x Per Week/30 Days ary Discharge Instructions: Apply Hydrofera Blue Ready to wound bed as directed Secondary Dressing: ABD Pad 5x9 (in/in) 1 x Per Week/30 Days Discharge Instructions: Cover with ABD pad Com pression Wrap: Urgo K2 Lite, two layer compression system, large 1 x Per Week/30 Days WOUND #2: - Foot Wound Laterality: Left, Medial Cleanser: Soap and Water 1 x Per Week/30 Days Discharge Instructions: Gently cleanse wound with antibacterial soap, rinse and pat dry prior to dressing wounds Cleanser: Wound Cleanser 1 x Per Week/30 Days Kannan, Rolando [name] ([id number]) [id number] Page  9 of 11 Discharge Instructions: Wash your hands with soap and water. Remove old dressing, discard into plastic bag and place into trash. Cleanse the wound with Wound Cleanser prior to applying [name] clean dressing using gauze sponges, not tissues or cotton balls. Do not scrub or use excessive force. Pat dry using gauze sponges, not tissue or cotton balls. Prim Dressing: Hydrofera Blue Ready Transfer Foam, 2.5x2.5 (in/in) 1 x Per Week/30  Days ary Discharge Instructions: Apply Hydrofera Blue Ready to wound bed as directed Secondary Dressing: ABD Pad 5x9 (in/in) 1 x Per Week/30 Days Discharge Instructions: Cover with ABD pad Com pression Wrap: Urgo K2 Lite, two layer compression system, large 1 x Per Week/30 Days 1. I am going to recommend based on what I am seeing at this point that we have the patient going continue to monitor for any signs of infection or worsening. Based on what I am seeing I do believe that we are making good headway towards complete closure. 2. I am going to recommend specifically that the patient should utilize the Urgo K2 lite compression wrap really give that [name] shot to start out with here first. 3. I am going to recommend Hydrofera Blue and ABD pad to cover. 4. I am also going to suggest that the patient should continue to monitor for any signs of infection or worsening. Office if anything changes he knows to contact the office and let me know. We will see patient back for reevaluation in 1 week here in the clinic. If anything worsens or changes patient will contact our office for additional recommendations. Electronic Signature(s) Signed: 04/18/2023 5:34:47 PM By: Ryan Derry PA-C Entered By: Ryan [name] on 04/18/2023 14:34:47 -------------------------------------------------------------------------------- ROS/PFSH Details Patient Name: Date of Service: Ryan [name]. 04/18/2023 9:30 [name] Medical Record Number: [id number] Patient Account Number: [id number] Date of Birth/Sex: Treating RN: [birth year] (52 y.o. Ryan [name] Primary Care Provider: Larae [name] Other Clinician: Referring Provider: Treating Provider/Extender: Ryan [name] in Treatment: 0 Information Obtained From Patient Integumentary (Skin) Complaints and Symptoms: Positive for: Wounds; Swelling Cardiovascular Medical History: Positive for: Arrhythmia; Congestive Heart Failure; Hypertension;  Peripheral Venous Disease Endocrine Medical History: Positive for: Type II Diabetes Time with diabetes: 3 years Treated with: Insulin Immunizations Pneumococcal Vaccine: Received Pneumococcal Vaccination: No Implantable Devices None Family and Social History VIVAAN, BUSBIN ([id number]) [id number] Page 10 of 11 Never smoker; Marital Status - Married; Alcohol Use: Never; Drug Use: No History; Caffeine Use: Moderate Electronic Signature(s) Signed: 04/18/2023 5:35:17 PM By: Ryan Derry PA-C Signed: 04/29/2023 11:59:03 AM By: Ryan Pax RN Entered By: Ryan [name] on 04/18/2023 06:58:37 -------------------------------------------------------------------------------- SuperBill Details Patient Name: Date of Service: Ryan [name]. 04/18/2023 Medical Record Number: [id number] Patient Account Number: [id number] Date of Birth/Sex: Treating RN: Apr 23, 1971 (52 y.o. [name]) Jettie Pagan, Ryan [name] Primary Care Provider: Larae [name] Other Clinician: Referring Provider: Treating Provider/Extender: Ryan [name] in Treatment: 0 Diagnosis Coding ICD-10 Codes Code Description [phone] Type 2 diabetes mellitus with other skin ulcer L97.822 Non-pressure chronic ulcer of other part of left lower leg with fat layer exposed E11.621 Type 2 diabetes mellitus with foot ulcer L97.522 Non-pressure chronic ulcer of other part of left foot with fat layer exposed I50.42 Chronic combined systolic (congestive) and diastolic (congestive) heart failure I89.0 Lymphedema, not elsewhere classified 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 I10 Essential (primary) hypertension I73.89 Other specified peripheral vascular diseases N18.30 Chronic kidney disease, stage 3 unspecified Facility  Procedures : CPT4 Code: [id number] Description: 99213 - WOUND CARE VISIT-LEV 3 EST PT Modifier: Quantity: 1 : CPT4 Code:  [id number] Description: 11042 - DEB SUBQ TISSUE 20 SQ CM/< ICD-10 Diagnosis Description L97.822 Non-pressure chronic ulcer of other part of left lower leg with fat layer expose Modifier: d Quantity: 1 Physician Procedures : CPT4 Code Description Modifier [id number] WC PHYS LEVEL 3 NEW PT 25 ICD-10 Diagnosis Description E11.622 Type 2 diabetes mellitus with other skin ulcer L97.822 Non-pressure chronic ulcer of other part of left lower leg with fat layer exposed E11.621 Type  2 diabetes mellitus with foot ulcer L97.522 Non-pressure chronic ulcer of other part of left foot with fat layer exposed Quantity: 1 : [id number] 11042 - WC PHYS SUBQ TISS 20 SQ CM ICD-10 Diagnosis Description L97.822 Non-pressure chronic ulcer of other part of left lower leg with fat layer exposed Quantity: 1 Electronic Signature(s) Ireland, Ekansh [name] ([id number]) [id number] Page 11 of 11 Signed: 04/18/2023 5:35:03 PM By: Ryan Derry PA-C Entered By: Ryan [name] on 04/18/2023 14:35:02

## 2023-04-29 NOTE — Progress Notes (Signed)
OSIRUS, BARDIN A ([id number]) [id number] Page 1 of 10 Visit Report for 04/18/2023 Allergy List Details Patient Name: Date of Service: RETT, SINGLETON RLES A. 04/18/2023 9:30 A M Medical Record Number: [id number] Patient Account Number: [id number] Date of Birth/Sex: Treating RN: 1971/08/23 (52 y.o. Ryan [name]) Yevonne Pax Primary Care Shawndell Varas: [name] Other Clinician: Referring Annalicia Renfrew: Treating Bellarae Lizer/Extender: [name] in Treatment: 0 Allergies Active Allergies metrizamide clindamycin lincomycin Sulfa (Sulfonamide Antibiotics) Allergy Notes Electronic Signature(s) Signed: 04/29/2023 11:59:03 AM By: Yevonne Pax RN Entered By: Yevonne Pax on 04/18/2023 06:55:21 -------------------------------------------------------------------------------- Arrival Information Details Patient Name: Date of Service: [name] 04/18/2023 9:30 A M Medical Record Number: [id number] Patient Account Number: [id number] Date of Birth/Sex: Treating RN: 07-21-1971 (52 y.o. Ryan [name] Primary Care Nilo Fallin: [name] Other Clinician: Referring Kiyonna Tortorelli: Treating Amalea Ottey/Extender: [name] in Treatment: 0 Visit Information Patient Arrived: Ambulatory Arrival Time: 09:51 Accompanied By: self Transfer Assistance: None Patient Identification Verified: Yes Secondary Verification Process Completed: Yes Patient Has Alerts: Yes Patient Alerts: Patient on Blood Thinner diabetic KERR, SOTTO A ([id number]) [id number] Page 2 of 10 Electronic Signature(s) Signed: 04/29/2023 11:59:03 AM By: Yevonne Pax RN Entered By: Yevonne Pax on 04/18/2023 06:51:55 -------------------------------------------------------------------------------- Clinic Level of Care Assessment Details Patient Name: Date of Service: GERAN, LAUKAITIS A. 04/18/2023 9:30 A M Medical Record Number: [id number] Patient Account  Number: [id number] Date of Birth/Sex: Treating RN: 1970/10/23 (52 y.o. Ryan [name]) Yevonne Pax Primary Care Della Homan: [name] Other Clinician: Referring Kama Cammarano: Treating Dede Dobesh/Extender: [name] in Treatment: 0 Clinic Level of Care Assessment Items TOOL 1 Quantity Score X- 1 0 Use when EandM and Procedure is performed on INITIAL visit ASSESSMENTS - Nursing Assessment / Reassessment X- 1 20 General Physical Exam (combine w/ comprehensive assessment (listed just below) when performed on new pt. evals) X- 1 25 Comprehensive Assessment (HX, ROS, Risk Assessments, Wounds Hx, etc.) ASSESSMENTS - Wound and Skin Assessment / Reassessment []  - 0 Dermatologic / Skin Assessment (not related to wound area) ASSESSMENTS - Ostomy and/or Continence Assessment and Care []  - 0 Incontinence Assessment and Management []  - 0 Ostomy Care Assessment and Management (repouching, etc.) PROCESS - Coordination of Care X - Simple Patient / Family Education for ongoing care 1 15 []  - 0 Complex (extensive) Patient / Family Education for ongoing care []  - 0 Staff obtains Chiropractor, Records, T Results / Process Orders est []  - 0 Staff telephones HHA, Nursing Homes / Clarify orders / etc []  - 0 Routine Transfer to another Facility (non-emergent condition) []  - 0 Routine Hospital Admission (non-emergent condition) X- 1 15 New Admissions / Manufacturing engineer / Ordering NPWT Apligraf, etc. , []  - 0 Emergency Hospital Admission (emergent condition) PROCESS - Special Needs []  - 0 Pediatric / Minor Patient Management []  - 0 Isolation Patient Management []  - 0 Hearing / Language / Visual special needs []  - 0 Assessment of Community assistance (transportation, D/C planning, etc.) []  - 0 Additional assistance / Altered mentation []  - 0 Support Surface(s) Assessment (bed, cushion, seat, etc.) INTERVENTIONS - Miscellaneous []  - 0 External ear exam Monterroso, Ephram A  ([id number]) [id number] Page 3 of 10 []  - 0 Patient Transfer (multiple staff / Nurse, adult / Similar devices) []  - 0 Simple Staple / Suture removal (25 or less) []  - 0 Complex Staple / Suture removal (26 or more) []  - 0 Hypo/Hyperglycemic Management (do not check if billed separately) X- 1 15 Ankle / Brachial Index (ABI) -  do not check if billed separately Has the patient been seen at the hospital within the last three years: Yes Total Score: 90 Level Of Care: New/Established - Level 3 Electronic Signature(s) Signed: 04/29/2023 11:59:03 AM By: Yevonne Pax RN Entered By: Yevonne Pax on 04/18/2023 07:41:43 -------------------------------------------------------------------------------- Encounter Discharge Information Details Patient Name: Date of Service: [name] 04/18/2023 9:30 A M Medical Record Number: [id number] Patient Account Number: [id number] Date of Birth/Sex: Treating RN: September 30, 1970 (52 y.o. Ryan [name] Primary Care Unnamed Hino: [name] Other Clinician: Referring Necie Wilcoxson: Treating Charels Stambaugh/Extender: [name] in Treatment: 0 Encounter Discharge Information Items Post Procedure Vitals Discharge Condition: Stable Temperature (F): 97.8 Ambulatory Status: Ambulatory Pulse (bpm): 91 Discharge Destination: Home Respiratory Rate (breaths/min): 18 Transportation: Private Auto Blood Pressure (mmHg): 132/81 Accompanied By: self Schedule Follow-up Appointment: Yes Clinical Summary of Care: Electronic Signature(s) Signed: 04/29/2023 11:59:03 AM By: Yevonne Pax RN Entered By: Yevonne Pax on 04/18/2023 07:42:30 -------------------------------------------------------------------------------- Lower Extremity Assessment Details Patient Name: Date of Service: CADENCE, MCLAMB A. 04/18/2023 9:30 A M Medical Record Number: [id number] Patient Account Number: [id number] Date of Birth/Sex: Treating RN: 04/08/1971 (52  y.o. Ryan [name] Primary Care Anfernee Peschke: [name] Other Clinician: Referring Juliett Eastburn: Treating Alicya Bena/Extender: [name] in Treatment: 0 CADENCE, MCLAMB ([id number]) [id number] Page 4 of 10 Edema Assessment Assessed: [Left: No] [Right: No] Edema: [Left: Ye] [Right: s] Calf Left: Right: Point of Measurement: 35 cm From Medial Instep 54 cm Ankle Left: Right: Point of Measurement: 10 cm From Medial Instep 33 cm Knee To Floor Left: Right: From Medial Instep 47 cm Vascular Assessment Pulses: Dorsalis Pedis Palpable: [Left:Yes] Doppler Audible: [Left:Yes] Extremity colors, hair growth, and conditions: Extremity Color: [Left:Hyperpigmented] Hair Growth on Extremity: [Left:No] Temperature of Extremity: [Left:Warm] Capillary Refill: [Left:< 3 seconds] Dependent Rubor: [Left:No] Blanched when Elevated: [Left:No] Lipodermatosclerosis: [Left:Yes] Blood Pressure: Brachial: [Left:132] Ankle: [Left:Dorsalis Pedis: 110 0.83] Toe Nail Assessment Left: Right: Thick: Yes Discolored: Yes Deformed: Yes Improper Length and Hygiene: Yes Electronic Signature(s) Signed: 04/29/2023 11:59:03 AM By: Yevonne Pax RN Entered By: Yevonne Pax on 04/18/2023 07:21:48 -------------------------------------------------------------------------------- Multi Wound Chart Details Patient Name: Date of Service: [name] 04/18/2023 9:30 A M Medical Record Number: [id number] Patient Account Number: [id number] Date of Birth/Sex: Treating RN: 1971-07-13 (52 y.o. Ryan [name] Primary Care Mileidy Atkin: [name] Other Clinician: Referring Hervey Wedig: Treating Fremon Zacharia/Extender: [name] in Treatment: 0 Vital Signs Height(in): 67 Pulse(bpm): 89 Weight(lbs): 338 Blood Pressure(mmHg): 132/81 Long, Natalie A ([id number]) [id number] Page 5 of 10 Body Mass Index(BMI):  52.9 Temperature(F): 97.8 Respiratory Rate(breaths/min): 18 [Treatment Notes:Wound Assessments Treatment Notes] Electronic Signature(s) Signed: 04/29/2023 11:59:03 AM By: Yevonne Pax RN Entered By: Yevonne Pax on 04/18/2023 07:17:04 -------------------------------------------------------------------------------- Multi-Disciplinary Care Plan Details Patient Name: Date of Service: [name] 04/18/2023 9:30 A M Medical Record Number: [id number] Patient Account Number: [id number] Date of Birth/Sex: Treating RN: 21-Oct-1970 (52 y.o. Ryan [name] Primary Care Shirah Roseman: [name] Other Clinician: Referring Allyce Bochicchio: Treating Yizel Canby/Extender: [name] in Treatment: 0 Active Inactive Necrotic Tissue Nursing Diagnoses: Knowledge deficit related to management of necrotic/devitalized tissue Goals: Patient/caregiver will verbalize understanding of reason and process for debridement of necrotic tissue Date Initiated: 04/18/2023 Target Resolution Date: 05/19/2023 Goal Status: Active Interventions: Assess patient pain level pre-, during and post procedure and prior to discharge Notes: Wound/Skin Impairment Nursing Diagnoses: Knowledge deficit related to ulceration/compromised skin integrity Goals: Patient/caregiver will verbalize understanding of skin care regimen Date  Initiated: 04/18/2023 Target Resolution Date: 05/19/2023 Goal Status: Active Ulcer/skin breakdown will have a volume reduction of 30% by week 4 Date Initiated: 04/18/2023 Target Resolution Date: 05/19/2023 Goal Status: Active Ulcer/skin breakdown will have a volume reduction of 50% by week 8 Date Initiated: 04/18/2023 Target Resolution Date: 06/18/2023 Goal Status: Active Ulcer/skin breakdown will have a volume reduction of 80% by week 12 Date Initiated: 04/18/2023 Target Resolution Date: 07/19/2023 Goal Status: Active Ulcer/skin breakdown will heal within 14 weeks Date Initiated:  04/18/2023 Target Resolution Date: 08/18/2023 Goal Status: Active TURKI, BLOK ([id number]) [id number] Page 6 of 10 Interventions: Assess patient/caregiver ability to obtain necessary supplies Assess patient/caregiver ability to perform ulcer/skin care regimen upon admission and as needed Assess ulceration(s) every visit Notes: Electronic Signature(s) Signed: 04/29/2023 11:59:03 AM By: Yevonne Pax RN Entered By: Yevonne Pax on 04/18/2023 07:23:03 -------------------------------------------------------------------------------- Pain Assessment Details Patient Name: Date of Service: [name] 04/18/2023 9:30 A M Medical Record Number: [id number] Patient Account Number: [id number] Date of Birth/Sex: Treating RN: 1971-05-03 (52 y.o. Ryan [name] Primary Care Xyla Leisner: [name] Other Clinician: Referring Sosha Shepherd: Treating Nimsi Males/Extender: [name] in Treatment: 0 Active Problems Location of Pain Severity and Description of Pain Patient Has Paino No Site Locations Pain Management and Medication Current Pain Management: Electronic Signature(s) Signed: 04/29/2023 11:59:03 AM By: Yevonne Pax RN Entered By: Yevonne Pax on 04/18/2023 06:53:17 Bohorquez, Bing Neighbors ([id number]) [id number] Page 7 of 10 -------------------------------------------------------------------------------- Patient/Caregiver Education Details Patient Name: Date of Service: KORDALE, OBA 8/19/2024andnbsp9:30 A M Medical Record Number: [id number] Patient Account Number: [id number] Date of Birth/Gender: Treating RN: [birth year] (52 y.o. Ryan [name]) Yevonne Pax Primary Care Physician: [name] Other Clinician: Referring Physician: Treating Physician/Extender: [name] in Treatment: 0 Education Assessment Education Provided To: Patient Education Topics Provided Welcome [facility]New [facility] Packet: o Handouts: Welcome [facility] o Methods: Explain/Verbal Responses: State content correctly Electronic Signature(s) Signed: 04/29/2023 11:59:03 AM By: Yevonne Pax RN Entered By: Yevonne Pax on 04/18/2023 07:23:17 -------------------------------------------------------------------------------- Wound Assessment Details Patient Name: Date of Service: [name] 04/18/2023 9:30 A M Medical Record Number: [id number] Patient Account Number: [id number] Date of Birth/Sex: Treating RN: 1971/08/06 (52 y.o. Ryan [name]) Yevonne Pax Primary Care Jocee Kissick: [name] Other Clinician: Referring Ricquel Foulk: Treating Brinleigh Tew/Extender: [name] in Treatment: 0 Wound Status Wound Number: 1 Primary Diabetic Wound/Ulcer of the Lower Extremity Etiology: Wound Location: Left, Medial Lower Leg Wound Open Wounding Event: Gradually Appeared Status: Date Acquired: 02/28/2023 Comorbid Arrhythmia, Congestive Heart Failure, Hypertension, Peripheral Weeks Of Treatment: 0 History: Venous Disease, Type II Diabetes Clustered Wound: No Photos NOHEA, MATHIESEN A ([id number]) [id number] Page 8 of 10 Wound Measurements Length: (cm) 2 Width: (cm) 2.5 Depth: (cm) 0.3 Area: (cm) 3.927 Volume: (cm) 1.178 % Reduction in Area: % Reduction in Volume: Epithelialization: None Tunneling: No Undermining: No Wound Description Classification: Grade 2 Exudate Amount: Medium Exudate Type: Serosanguineous Exudate Color: red, brown Foul Odor After Cleansing: No Slough/Fibrino Yes Wound Bed Granulation Amount: Medium (34-66%) Exposed Structure Granulation Quality: Pink Fascia Exposed: No Necrotic Amount: Medium (34-66%) Fat Layer (Subcutaneous Tissue) Exposed: Yes Necrotic Quality: Adherent Slough Tendon Exposed: No Muscle Exposed: No Joint Exposed: No Bone Exposed: No Electronic Signature(s) Signed: 04/29/2023 11:59:03  AM By: Yevonne Pax RN Entered By: Yevonne Pax on 04/18/2023 07:19:15 -------------------------------------------------------------------------------- Wound Assessment Details Patient Name: Date of Service: [name] 04/18/2023 9:30 A M Medical Record Number: [id number] Patient  Account Number: [id number] Date of Birth/Sex: Treating RN: 01-05-1971 (52 y.o. Ryan [name]) Yevonne Pax Primary Care Kyliegh Jester: [name] Other Clinician: Referring Wirt Hemmerich: Treating Keimora Swartout/Extender: [name] in Treatment: 0 Wound Status Wound Number: 2 Primary Diabetic Wound/Ulcer of the Lower Extremity Etiology: Wound Location: Left, Medial Foot Wound Open Wounding Event: Gradually Appeared Status: Date Acquired: 02/28/2023 Comorbid Arrhythmia, Congestive Heart Failure, Hypertension, Peripheral Weeks Of Treatment: 0 History: Venous Disease, Type II Diabetes Clustered Wound: No Photos JAH, MCQUEARY A ([id number]) [id number] Page 9 of 10 Wound Measurements Length: (cm) 1.3 Width: (cm) 2.5 Depth: (cm) 0.2 Area: (cm) 2.553 Volume: (cm) 0.511 % Reduction in Area: % Reduction in Volume: Epithelialization: None Tunneling: No Undermining: No Wound Description Classification: Grade 1 Exudate Amount: Medium Exudate Type: Serosanguineous Exudate Color: red, brown Foul Odor After Cleansing: No Slough/Fibrino Yes Wound Bed Granulation Amount: Small (1-33%) Exposed Structure Granulation Quality: Pink Fascia Exposed: No Necrotic Amount: Large (67-100%) Fat Layer (Subcutaneous Tissue) Exposed: Yes Necrotic Quality: Adherent Slough Tendon Exposed: No Muscle Exposed: No Joint Exposed: No Bone Exposed: No Electronic Signature(s) Signed: 04/29/2023 11:59:03 AM By: Yevonne Pax RN Entered By: Yevonne Pax on 04/18/2023 07:20:50 -------------------------------------------------------------------------------- Vitals Details Patient Name: Date of  Service: [name] 04/18/2023 9:30 A M Medical Record Number: [id number] Patient Account Number: [id number] Date of Birth/Sex: Treating RN: 1971/08/19 (52 y.o. Ryan [name]) Yevonne Pax Primary Care Raudel Bazen: [name] Other Clinician: Referring Kaydn Kumpf: Treating Edilberto Roosevelt/Extender: [name] in Treatment: 0 Vital Signs Time Taken: 09:53 Temperature (F): 97.8 Height (in): 67 Pulse (bpm): 89 Source: Stated Respiratory Rate (breaths/min): 18 Weight (lbs): 338 Blood Pressure (mmHg): 132/81 Source: Stated Reference Range: 80 - 120 mg / dl Body Mass Index (BMI): 52.9 Electronic Signature(s) Signed: 04/29/2023 11:59:03 AM By: Yevonne Pax RN Martire, Deavin A8/30/2024 11:59:03 AM By: Yevonne Pax RN Signed: ([id number]) [id number].pdf Page 10 of 10 Entered By: Yevonne Pax on 04/18/2023 06:54:16

## 2023-04-29 NOTE — Progress Notes (Signed)
Ryan [name], Ryan [name] ([id number]) [id number] Page 1 of 7 Visit Report for 04/25/2023 HPI Details Patient Name: Date of Service: Ryan [name], Ryan [name]. 04/25/2023 3:15 PM Medical Record Number: [id number] Patient Account Number: [id number] Date of Birth/Sex: Treating RN: February 21, 1971 (52 y.o. [name]) Yevonne Pax Primary Care Provider: [name] Other Clinician: Referring Provider: Treating Provider/Extender: RO BSO N, MICHA EL Mallie Mussel in Treatment: 1 History of Present Illness Chronic/Inactive Conditions Condition 1: 04-18-2023 patient screening ABI today was 0.83 and this was on the left. With that being said he had previous ABIs done formally on 06-25-2021 with [name] right ABI of 1.12 and [name] TBI of 1.03 on the left she had an ABI of 1.12 with [name] TBI of 0.98. HPI Description: 04-18-2023 upon evaluation today patient presents for initial inspection here in our clinic some concerning issues he is having with his left proximal foot as well as his left lower extremity. This actually appears to be more of [name] situation to be honest. He does have chronic lower extremity swelling secondary to what appears to be venous stasis he has not really had any wounds significantly before and tells me that it has been [name] probably even 12 or so since he has last used his lymphedema pumps. He is not even sure if they are actually functioning anymore or not. With that being said this is 1 thing that I did want him to look into. He is on [name] fluid pill and he does not wear compression socks on [name] regular basis although I definitely think that something that he also really needs to be doing to be honest. He does not have any injury that occurred to cause these wounds they more or less spontaneously occurred as [name] result of what appears to be excessive swelling. Patient does have [name] history of diabetes mellitus type 2, congestive heart failure, lymphedema, venous  hypertension, hypertension, peripheral vascular disease, and chronic kidney disease stage III. 8/26; this is [name] patient with chronic lymphedema. He has wounds on the left medial lower extremity and left medial foot. We have been using Hydrofera Blue and Urgo K2. He tells me he has [name] compression pump at home although it is missing apart and he is not really familiar with with which company to call. He also started on furosemide 20 mg every other day probably would do better with every day and he is talking to his physician about this. Electronic Signature(s) Signed: 04/25/2023 4:34:10 PM By: Baltazar Najjar MD Entered By: Baltazar Najjar on 04/25/2023 12:51:20 -------------------------------------------------------------------------------- Physical Exam Details Patient Name: Date of Service: Ryan Elm RLES [name]. 04/25/2023 3:15 PM Medical Record Number: [id number] Patient Account Number: [id number] Date of Birth/Sex: Treating RN: 1971-08-12 (52 y.o. [name] Primary Care Provider: [name] Other Clinician: Referring Provider: Treating Provider/Extender: RO BSO N, MICHA EL Mallie Mussel in Treatment: 1 Constitutional Patient is hypertensive.. Pulse regular and within target range for patient.[name] Respirations regular, non-labored and within target range.. Temperature is normal and within the target range for the patient.[name] appears in no distress. Cardiovascular TRE, BARCELLONA [name] ([id number]) [id number] Page 2 of 7 Dorsalis pedis pulses palpable on the left I could not feel the posterior tibial. Notes Wound exam; the patient has 2 wounds on the medial lower leg and medial left foot. The base of these wounds does not look too bad. Edema control is marginal. Marked damage to the skin from prolonged lymphedema Electronic Signature(s) Signed: 04/25/2023 4:34:10  PM By: Baltazar Najjar MD Entered By: Baltazar Najjar on 04/25/2023  12:54:38 -------------------------------------------------------------------------------- Physician Orders Details Patient Name: Date of Service: Ryan Elm RLES [name]. 04/25/2023 3:15 PM Medical Record Number: [id number] Patient Account Number: [id number] Date of Birth/Sex: Treating RN: 06-24-1971 (52 y.o. [name] Primary Care Provider: [name] Other Clinician: Referring Provider: Treating Provider/Extender: RO BSO N, MICHA EL Mallie Mussel in Treatment: 1 Verbal / Phone Orders: No Diagnosis Coding Follow-up Appointments Return Appointment in 1 week. Bathing/ Shower/ Hygiene May shower with wound dressing protected with water repellent cover or cast protector. Anesthetic (Use 'Patient Medications' Section for Anesthetic Order Entry) Lidocaine applied to wound bed Edema Control - Lymphedema / Segmental Compressive Device / Other Elevate, Exercise Daily and [name] void Standing for Long Periods of Time. Elevate legs to the level of the heart and pump ankles as often as possible Elevate leg(s) parallel to the floor when sitting. Wound Treatment Wound #1 - Lower Leg Wound Laterality: Left, Medial Cleanser: Soap and Water 1 x Per Week/30 Days Discharge Instructions: Gently cleanse wound with antibacterial soap, rinse and pat dry prior to dressing wounds Cleanser: Wound Cleanser 1 x Per Week/30 Days Discharge Instructions: Wash your hands with soap and water. Remove old dressing, discard into plastic bag and place into trash. Cleanse the wound with Wound Cleanser prior to applying [name] clean dressing using gauze sponges, not tissues or cotton balls. Do not scrub or use excessive force. Pat dry using gauze sponges, not tissue or cotton balls. Prim Dressing: Hydrofera Blue Ready Transfer Foam, 2.5x2.5 (in/in) 1 x Per Week/30 Days ary Discharge Instructions: Apply Hydrofera Blue Ready to wound bed as directed Secondary Dressing: ABD Pad 5x9 (in/in) 1 x Per Week/30  Days Discharge Instructions: Cover with ABD pad Compression Wrap: Urgo K2 Lite, two layer compression system, large 1 x Per Week/30 Days Wound #2 - Foot Wound Laterality: Left, Medial Cleanser: Soap and Water 1 x Per Week/30 Days Discharge Instructions: Gently cleanse wound with antibacterial soap, rinse and pat dry prior to dressing wounds Cleanser: Wound Cleanser 1 x Per Week/30 Days Discharge Instructions: Wash your hands with soap and water. Remove old dressing, discard into plastic bag and place into trash. Cleanse the wound with Wound Cleanser prior to applying [name] clean dressing using gauze sponges, not tissues or cotton balls. Do not scrub or use excessive force. Pat dry using gauze sponges, not tissue or cotton balls. NATRELL, KOSS [name] ([id number]) [id number] Page 3 of 7 Prim Dressing: Hydrofera Blue Ready Transfer Foam, 2.5x2.5 (in/in) 1 x Per Week/30 Days ary Discharge Instructions: Apply Hydrofera Blue Ready to wound bed as directed Secondary Dressing: ABD Pad 5x9 (in/in) 1 x Per Week/30 Days Discharge Instructions: Cover with ABD pad Compression Wrap: Urgo K2 Lite, two layer compression system, large 1 x Per Week/30 Days Electronic Signature(s) Signed: 04/25/2023 4:34:10 PM By: Baltazar Najjar MD Signed: 04/29/2023 11:58:03 AM By: Yevonne Pax RN Entered By: Yevonne Pax on 04/25/2023 12:36:38 -------------------------------------------------------------------------------- Problem List Details Patient Name: Date of Service: Ryan Elm RLES [name]. 04/25/2023 3:15 PM Medical Record Number: [id number] Patient Account Number: [id number] Date of Birth/Sex: Treating RN: 1971-04-22 (52 y.o. [name] Primary Care Provider: [name] Other Clinician: Referring Provider: Treating Provider/Extender: RO BSO N, MICHA EL Mallie Mussel in Treatment: 1 Active Problems ICD-10 Encounter Code Description Active Date MDM Diagnosis E11.622 Type 2  diabetes mellitus with other skin ulcer 04/18/2023 No Yes L97.822 Non-pressure chronic ulcer of other part of left  lower leg with fat layer exposed8/19/2024 No Yes E11.621 Type 2 diabetes mellitus with foot ulcer 04/18/2023 No Yes L97.522 Non-pressure chronic ulcer of other part of left foot with fat layer exposed 04/18/2023 No Yes I50.42 Chronic combined systolic (congestive) and diastolic (congestive) heart failure 04/18/2023 No Yes I89.0 Lymphedema, not elsewhere classified 04/18/2023 No Yes I87.332 Chronic venous hypertension (idiopathic) with ulcer and inflammation of left 04/18/2023 No Yes lower extremity I10 Essential (primary) hypertension 04/18/2023 No Yes Ryan [name], Ryan [name] ([id number]) [id number] Page 4 of 7 I73.89 Other specified peripheral vascular diseases 04/18/2023 No Yes N18.30 Chronic kidney disease, stage 3 unspecified 04/18/2023 No Yes Inactive Problems Resolved Problems Electronic Signature(s) Signed: 04/25/2023 4:34:10 PM By: Baltazar Najjar MD Entered By: Baltazar Najjar on 04/25/2023 12:48:16 -------------------------------------------------------------------------------- Progress Note Details Patient Name: Date of Service: Ryan Elm RLES [name]. 04/25/2023 3:15 PM Medical Record Number: [id number] Patient Account Number: [id number] Date of Birth/Sex: Treating RN: 26-Jul-1971 (52 y.o. [name] Primary Care Provider: [name] Other Clinician: Referring Provider: Treating Provider/Extender: RO BSO N, MICHA EL Mallie Mussel in Treatment: 1 Subjective History of Present Illness (HPI) Chronic/Inactive Condition: 04-18-2023 patient screening ABI today was 0.83 and this was on the left. With that being said he had previous ABIs done formally on 06-25-2021 with [name] right ABI of 1.12 and [name] TBI of 1.03 on the left she had an ABI of 1.12 with [name] TBI of 0.98. 04-18-2023 upon evaluation today patient presents for initial inspection here in our  clinic some concerning issues he is having with his left proximal foot as well as his left lower extremity. This actually appears to be more of [name] situation to be honest. He does have chronic lower extremity swelling secondary to what appears to be venous stasis he has not really had any wounds significantly before and tells me that it has been [name] probably even 12 or so since he has last used his lymphedema pumps. He is not even sure if they are actually functioning anymore or not. With that being said this is 1 thing that I did want him to look into. He is on [name] fluid pill and he does not wear compression socks on [name] regular basis although I definitely think that something that he also really needs to be doing to be honest. He does not have any injury that occurred to cause these wounds they more or less spontaneously occurred as [name] result of what appears to be excessive swelling. Patient does have [name] history of diabetes mellitus type 2, congestive heart failure, lymphedema, venous hypertension, hypertension, peripheral vascular disease, and chronic kidney disease stage III. 8/26; this is [name] patient with chronic lymphedema. He has wounds on the left medial lower extremity and left medial foot. We have been using Hydrofera Blue and Urgo K2. He tells me he has [name] compression pump at home although it is missing apart and he is not really familiar with with which company to call. He also started on furosemide 20 mg every other day probably would do better with every day and he is talking to his physician about this. Objective Constitutional Patient is hypertensive.. Pulse regular and within target range for patient.[name] Respirations regular, non-labored and within target range.. Temperature is normal and within the target range for the patient.[name] appears in no distress. Vitals Time Taken: 3:15 PM, Height: 67 in, Weight: 338 lbs, BMI: 52.9, Temperature: 97.8 F, Pulse: 88 bpm,  Respiratory Rate: 18 breaths/min, Blood Pressure:  141/97 mmHg. Ryan [name], Ryan [name] ([id number]) [id number] Page 5 of 7 Cardiovascular Dorsalis pedis pulses palpable on the left I could not feel the posterior tibial. General Notes: Wound exam; the patient has 2 wounds on the medial lower leg and medial left foot. The base of these wounds does not look too bad. Edema control is marginal. Marked damage to the skin from prolonged lymphedema Integumentary (Hair, Skin) Wound #1 status is Open. Original cause of wound was Gradually Appeared. The date acquired was: 02/28/2023. The wound has been in treatment 1 weeks. The wound is located on the Left,Medial Lower Leg. The wound measures 2cm length x 2.5cm width x 0.3cm depth; 3.927cm^2 area and 1.178cm^3 volume. There is Fat Layer (Subcutaneous Tissue) exposed. There is no tunneling or undermining noted. There is [name] medium amount of serosanguineous drainage noted. There is medium (34-66%) pink granulation within the wound bed. There is [name] medium (34-66%) amount of necrotic tissue within the wound bed including Adherent Slough. Wound #2 status is Open. Original cause of wound was Gradually Appeared. The date acquired was: 02/28/2023. The wound has been in treatment 1 weeks. The wound is located on the Left,Medial Foot. The wound measures 1.4cm length x 2cm width x 0.2cm depth; 2.199cm^2 area and 0.44cm^3 volume. There is Fat Layer (Subcutaneous Tissue) exposed. There is no tunneling or undermining noted. There is [name] medium amount of serosanguineous drainage noted. There is small (1-33%) pink granulation within the wound bed. There is [name] large (67-100%) amount of necrotic tissue within the wound bed including Adherent Slough. 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 There was [name] Double Layer Compression Therapy Procedure by Yevonne Pax, RN. Post procedure Diagnosis Wound #1: Same as Pre-Procedure Wound #2 Pre-procedure diagnosis of Wound #2 is [name] Diabetic Wound/Ulcer of the Lower Extremity located on the Left,Medial Foot . There was [name] Double Layer Compression Therapy Procedure by Yevonne Pax, RN. Post procedure Diagnosis Wound #2: Same as Pre-Procedure Plan Follow-up Appointments: Return Appointment in 1 week. Bathing/ Shower/ Hygiene: May shower with wound dressing protected with water repellent cover or cast protector. Anesthetic (Use 'Patient Medications' Section for Anesthetic Order Entry): Lidocaine applied to wound bed Edema Control - Lymphedema / Segmental Compressive Device / Other: Elevate, Exercise Daily and Avoid Standing for Long Periods of Time. Elevate legs to the level of the heart and pump ankles as often as possible Elevate leg(s) parallel to the floor when sitting. WOUND #1: - Lower Leg Wound Laterality: Left, Medial Cleanser: Soap and Water 1 x Per Week/30 Days Discharge Instructions: Gently cleanse wound with antibacterial soap, rinse and pat dry prior to dressing wounds Cleanser: Wound Cleanser 1 x Per Week/30 Days Discharge Instructions: Wash your hands with soap and water. Remove old dressing, discard into plastic bag and place into trash. Cleanse the wound with Wound Cleanser prior to applying  [name] clean dressing using gauze sponges, not tissues or cotton  balls. Do not scrub or use excessive force. Pat dry using gauze sponges, not tissue or cotton balls. Prim Dressing: Hydrofera Blue Ready Transfer Foam, 2.5x2.5 (in/in) 1 x Per Week/30 Days ary Discharge Instructions: Apply Hydrofera Blue Ready to wound bed as directed Secondary Dressing: ABD Pad 5x9 (in/in) 1 x Per Week/30 Days Discharge Instructions: Cover with ABD pad Com pression Wrap: Urgo K2 Lite, two layer compression system, large 1 x Per Week/30 Days WOUND #2: - Foot Wound Laterality: Left, Medial Cleanser: Soap and Water 1 x Per Week/30 Days Discharge Instructions: Gently cleanse wound with antibacterial soap, rinse and pat dry prior to dressing wounds Ryan [name], Ryan [name] ([id number]) [id number] Page 6 of 7 Cleanser: Wound Cleanser 1 x Per Week/30 Days Discharge Instructions: Wash your hands with soap and water. Remove old dressing, discard into plastic bag and place into trash. Cleanse the wound with Wound Cleanser prior to applying [name] clean dressing using gauze sponges, not tissues or cotton balls. Do not scrub or use excessive force. Pat dry using gauze sponges, not tissue or cotton balls. Prim Dressing: Hydrofera Blue Ready Transfer Foam, 2.5x2.5 (in/in) 1 x Per Week/30 Days ary Discharge Instructions: Apply Hydrofera Blue Ready to wound bed as directed Secondary Dressing: ABD Pad 5x9 (in/in) 1 x Per Week/30 Days Discharge Instructions: Cover with ABD pad Com pression Wrap: Urgo K2 Lite, two layer compression system, large 1 x Per Week/30 Days 1. I did not change the dressing which is Hydrofera Blue and Urgo K2 lite however if worse not making any progress by next week I would increase him to the full-strength Urgo K2 2. He has compression pumps at home however there are old, broken and he has not been using them for quite [name] long period of time although he was not specific on just how  long. Electronic Signature(s) Signed: 04/25/2023 4:34:10 PM By: Baltazar Najjar MD Entered By: Baltazar Najjar on 04/25/2023 12:56:01 -------------------------------------------------------------------------------- SuperBill Details Patient Name: Date of Service: Ryan Elm RLES [name]. 04/25/2023 Medical Record Number: [id number] Patient Account Number: [id number] Date of Birth/Sex: Treating RN: 02-Oct-1970 (52 y.o. [name]) Yevonne Pax Primary Care Provider: [name] Other Clinician: Referring Provider: Treating Provider/Extender: RO BSO Dorris Carnes, MICHA EL Mallie Mussel in Treatment: 1 Diagnosis Coding ICD-10 Codes Code Description E11.622 Type 2 diabetes mellitus with other skin ulcer L97.822 Non-pressure chronic ulcer of other part of left lower leg with fat layer exposed E11.621 Type 2 diabetes mellitus with foot ulcer L97.522 Non-pressure chronic ulcer of other part of left foot with fat layer exposed I50.42 Chronic combined systolic (congestive) and diastolic (congestive) heart failure I89.0 Lymphedema, not elsewhere classified 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 I10 Essential (primary) hypertension I73.89 Other specified peripheral vascular diseases N18.30 Chronic kidney disease, stage 3 unspecified Facility Procedures : CPT4 Code: 78469629 Description: (Facility Use Only) 29581LT - APPLY MULTLAY COMPRS LWR LT LEG Modifier: Quantity: 1 Physician Procedures : CPT4 Code Description Modifier [id number] - WC PHYS LEVEL 3 - EST PT ICD-10 Diagnosis Description E11.622 Type 2 diabetes mellitus with other skin ulcer I89.0 Lymphedema, not elsewhere classified L97.522 Non-pressure chronic ulcer of other part of  left foot with fat layer exposed Ryan [name], Ryan [name] ([id number]) [id number] L97.822 Non-pressure chronic ulcer of other part of left lower leg with fat layer exposed Quantity: 1 817.pdf Page  7 of 7 Electronic Signature(s) Signed: 04/25/2023 4:34:10 PM By: Baltazar Najjar MD Previous Signature: 04/25/2023 3:52:10 PM Version By: Yevonne Pax RN  Entered By: Baltazar Najjar on 04/25/2023 12:56:34

## 2023-05-03 ENCOUNTER — Ambulatory Visit: Payer: No Typology Code available for payment source | Admitting: Physician Assistant

## 2023-05-03 VITALS — BP 111/74 | HR 80 | Ht 67.0 in | Wt 337.4 lb

## 2023-05-03 DIAGNOSIS — S91002D Unspecified open wound, left ankle, subsequent encounter: Secondary | ICD-10-CM

## 2023-05-03 DIAGNOSIS — I2699 Other pulmonary embolism without acute cor pulmonale: Secondary | ICD-10-CM

## 2023-05-03 DIAGNOSIS — D689 Coagulation defect, unspecified: Secondary | ICD-10-CM

## 2023-05-03 DIAGNOSIS — Z86711 Personal history of pulmonary embolism: Secondary | ICD-10-CM | POA: Diagnosis not present

## 2023-05-03 LAB — COAGUCHEK XS/INR WAIVED
INR: 2.9 — ABNORMAL HIGH (ref 0.9–1.1)
Prothrombin Time: 34.9 s

## 2023-05-03 MED ORDER — OXYCODONE-ACETAMINOPHEN 5-325 MG PO TABS
1.0000 | ORAL_TABLET | Freq: Two times a day (BID) | ORAL | 0 refills | Status: AC | PRN
Start: 2023-05-03 — End: 2023-05-08

## 2023-05-03 NOTE — Progress Notes (Signed)
Acute Office Visit   Patient: Ryan [name]   DOB: 1971/05/21   52 y.o. Male  MRN: [id number] Visit Date: 05/03/2023  Today's healthcare provider: Oswaldo Conroy Teryn Gust, PA-C  Introduced myself to the patient as a Secondary school teacher and provided education on APPs in clinical practice.    Chief Complaint  Patient presents with   Wound Check    Patient says he has been receiving treatment from the wound clinic. Patient says he requesting pain medication as he is experiencing pain in his L leg where the wound is located but was informed to reach out to his PCP office. Patient says this week they had to compress the area and he is feeling the pain and says it feels like a shock radiating up his leg. Patient is receiving wound changes weekly next change is 05/05/23.   Subjective    HPI HPI     Wound Check    Additional comments: Patient says he has been receiving treatment from the wound clinic. Patient says he requesting pain medication as he is experiencing pain in his L leg where the wound is located but was informed to reach out to his PCP office. Patient says this week they had to compress the area and he is feeling the pain and says it feels like a shock radiating up his leg. Patient is receiving wound changes weekly next change is 05/05/23.      Last edited by Malen Gauze, CMA on 05/03/2023  3:16 PM.       Wound check  He is being followed by wound care - next apt is on Sept 5th  He has finished his Keflex course and reports the wounds appear to be improving with regular follow up to Wound care services He is currently wearing compression bandages placed by Wound care  He reports there is some pain with compression bandages - he states Tylenol did not help at all when he was walking around  He also states there is shooting pain in his left leg from ankle to knee    He has upcoming Pulmonology apt in Oct  He states his O2 saturations are  improving     Medications: Outpatient  Medications Prior to Visit  Medication Sig   albuterol (VENTOLIN HFA) 108 (90 Base) MCG/ACT inhaler Inhale 2 puffs into the lungs every 6 (six) hours as needed for wheezing or shortness of breath.   furosemide (LASIX) 40 MG tablet Take 1 tablet (40 mg total) by mouth every other day.   magnesium oxide (MAG-OX) 400 (240 Mg) MG tablet Take 400 mg by mouth daily.   metoprolol succinate (TOPROL-XL) 25 MG 24 hr tablet Take 1 tablet (25 mg total) by mouth daily.   OXYGEN Inhale 1 L into the lungs daily.   sacubitril-valsartan (ENTRESTO) 24-26 MG Take 1 tablet by mouth 2 (two) times daily.   tirzepatide [facility]) 5 MG/0.5ML Pen Inject 5 mg into the skin once a week.   warfarin (COUMADIN) 5 MG tablet Take 1 tablet (5 mg total) by mouth daily.   [DISCONTINUED] cephALEXin (KEFLEX) 500 MG capsule Take 1 capsule (500 mg total) by mouth 4 (four) times daily.   No facility-administered medications prior to visit.    Review of Systems  Constitutional:  Negative for chills and fever.  Respiratory:  Positive for shortness of breath.   Cardiovascular:  Negative for chest pain, palpitations and leg swelling.  Musculoskeletal:  Negative for joint swelling  and myalgias.  Skin:  Positive for wound.        Objective    BP 111/74   Pulse 80   Ht 5\' 7"  (1.702 m)   Wt (!) 337 lb 6.4 oz (153 kg)   SpO2 93%   BMI 52.84 kg/m     Physical Exam Vitals reviewed.  Constitutional:      General: He is awake.     Appearance: Normal appearance. He is well-developed and well-groomed.  HENT:     Head: Normocephalic and atraumatic.  Pulmonary:     Effort: Pulmonary effort is normal.  Skin:    General: Skin is warm.     Comments: Patient has compression bandages over left ankle wound    Neurological:     Mental Status: He is alert.  Psychiatric:        Behavior: Behavior is cooperative.       Results for orders placed or performed in visit on 05/03/23  CoaguChek XS/INR Waived  Result Value Ref  Range   INR 2.9 (H) 0.9 - 1.1   Prothrombin Time 34.9 sec    Assessment & Plan      No follow-ups on file.       Problem List Items Addressed This Visit       Cardiovascular and Mediastinum   Pulmonary emboli (HCC) (Chronic)    Chronic, ongoing Currently on Warfarin for management Recheck PT/INR today - in goal today at 2.9  Continue current dosing Follow up in 1 month for recheck or sooner if concerns arise      Relevant Orders   Ambulatory referral to Pulmonology     Hematopoietic and Hemostatic   Clotting disorder (HCC)   Relevant Orders   CoaguChek XS/INR Waived (Completed)     Other   History of pulmonary embolism and DVT    Chronic, historic condition Pt would like referral to new Pulmonology provider to assist with management Referral placed per request Continue to collaborate with Pulmonology for management      Relevant Orders   Ambulatory referral to Pulmonology   Other Visit Diagnoses     Open wound of left ankle, subsequent encounter    -  Primary Acute on chronic, ongoing Patient has open wound of left ankle that is being treated by Wound care services Wound is covered with compression bandages and he has instructions to keep it covered until next wound care apt.  Pt reports concerns for pain, especially when bandages are replaced, Will place script for Oxycodone-acetaminophen 5-325 mg to be used sparingly around bandage changes  Will defer to their recommendations Continue to collaborate with wound management Follow up as needed for progressing or persistent symptoms    Relevant Medications   oxyCODONE-acetaminophen (PERCOCET/ROXICET) 5-325 MG tablet        No follow-ups on file.   I, Mckinzi Eriksen E Shruti Arrey, PA-C, have reviewed all documentation for this visit. The documentation on 05/08/23 for the exam, diagnosis, procedures, and orders are all accurate and complete.   Jacquelin Hawking, MHS, PA-C [facility]

## 2023-05-05 ENCOUNTER — Ambulatory Visit: Payer: No Typology Code available for payment source | Admitting: Physician Assistant

## 2023-05-05 ENCOUNTER — Encounter: Payer: No Typology Code available for payment source | Attending: Physician Assistant | Admitting: Physician Assistant

## 2023-05-05 DIAGNOSIS — I87332 Chronic venous hypertension (idiopathic) with ulcer and inflammation of left lower extremity: Secondary | ICD-10-CM | POA: Insufficient documentation

## 2023-05-05 DIAGNOSIS — I130 Hypertensive heart and chronic kidney disease with heart failure and stage 1 through stage 4 chronic kidney disease, or unspecified chronic kidney disease: Secondary | ICD-10-CM | POA: Diagnosis not present

## 2023-05-05 DIAGNOSIS — I5042 Chronic combined systolic (congestive) and diastolic (congestive) heart failure: Secondary | ICD-10-CM | POA: Insufficient documentation

## 2023-05-05 DIAGNOSIS — I890 Lymphedema, not elsewhere classified: Secondary | ICD-10-CM | POA: Insufficient documentation

## 2023-05-05 DIAGNOSIS — L97522 Non-pressure chronic ulcer of other part of left foot with fat layer exposed: Secondary | ICD-10-CM | POA: Diagnosis not present

## 2023-05-05 DIAGNOSIS — N183 Chronic kidney disease, stage 3 unspecified: Secondary | ICD-10-CM | POA: Insufficient documentation

## 2023-05-05 DIAGNOSIS — E11621 Type 2 diabetes mellitus with foot ulcer: Secondary | ICD-10-CM | POA: Diagnosis not present

## 2023-05-05 DIAGNOSIS — E1122 Type 2 diabetes mellitus with diabetic chronic kidney disease: Secondary | ICD-10-CM | POA: Diagnosis not present

## 2023-05-05 DIAGNOSIS — E11622 Type 2 diabetes mellitus with other skin ulcer: Secondary | ICD-10-CM | POA: Insufficient documentation

## 2023-05-05 DIAGNOSIS — L97822 Non-pressure chronic ulcer of other part of left lower leg with fat layer exposed: Secondary | ICD-10-CM | POA: Insufficient documentation

## 2023-05-05 NOTE — Progress Notes (Addendum)
AKARSH, HEINSOHN A ([id number]) [id number] Page 1 of 9 Visit Report for 05/05/2023 Chief Complaint Document Details Patient Name: Date of Service: Ryan [name], Ryan RLES A. 05/05/2023 9:00 A M Medical Record Number: [id number] Patient Account Number: [id number] Date of Birth/Sex: Treating RN: 1970-11-20 (52 y.o. Laymond Purser Primary Care Provider: [name] Other Clinician: Betha Loa Referring Provider: Treating Provider/Extender: [name] in Treatment: 2 Information Obtained from: Patient Chief Complaint Left leg and left foot ulcers Electronic Signature(s) Signed: 05/05/2023 9:14:05 AM By: [name] PA-C Entered By: [name] on 05/05/2023 06:14:05 -------------------------------------------------------------------------------- Debridement Details Patient Name: Date of Service: Ryan [name] RLES A. 05/05/2023 9:00 A M Medical Record Number: [id number] Patient Account Number: [id number] Date of Birth/Sex: Treating RN: [birth year] (52 y.o. Laymond Purser Primary Care Provider: [name] Other Clinician: Betha Loa Referring Provider: Treating Provider/Extender: [name] in Treatment: 2 Debridement Performed for Assessment: Wound #1 Left,Medial Lower Leg Performed By: Physician [name], PA-C Debridement Type: Debridement Severity of Tissue Pre Debridement: Fat layer exposed Level of Consciousness (Pre-procedure): Awake and Alert Pre-procedure Verification/Time Out Yes - 09:59 Taken: Start Time: 09:59 Percent of Wound Bed Debrided: 100% T Area Debrided (cm): otal 3.61 Tissue and other material debrided: Viable, Non-Viable, Slough, Subcutaneous, Biofilm, Slough Level: Skin/Subcutaneous Tissue Debridement Description: Excisional Instrument: Curette Bleeding: Minimum Hemostasis Achieved: Pressure Response to Treatment: Procedure was tolerated well Level of Consciousness (Post-  Awake and Alert procedure): PAWAN, DAME A ([id number]) [id number] Page 2 of 9 Post Debridement Measurements of Total Wound Length: (cm) 2.3 Width: (cm) 2 Depth: (cm) 0.3 Volume: (cm) 1.084 Character of Wound/Ulcer Post Debridement: Stable Severity of Tissue Post Debridement: Fat layer exposed Post Procedure Diagnosis Same as Pre-procedure Electronic Signature(s) Signed: 05/05/2023 4:38:23 PM By: Angelina Pih Signed: 05/05/2023 5:10:42 PM By: Betha Loa Signed: 05/06/2023 2:02:35 PM By: [name] PA-C Entered By: Betha Loa on 05/05/2023 07:00:58 -------------------------------------------------------------------------------- Debridement Details Patient Name: Date of Service: Ryan [name] RLES A. 05/05/2023 9:00 A M Medical Record Number: [id number] Patient Account Number: [id number] Date of Birth/Sex: Treating RN: 02-18-1971 (52 y.o. Ryan [name] Primary Care Provider: [name] Other Clinician: Betha Loa Referring Provider: Treating Provider/Extender: [name] in Treatment: 2 Debridement Performed for Assessment: Wound #2 Left,Medial Foot Performed By: Physician [name], PA-C Debridement Type: Debridement Severity of Tissue Pre Debridement: Fat layer exposed Level of Consciousness (Pre-procedure): Awake and Alert Pre-procedure Verification/Time Out Yes - 10:01 Taken: Start Time: 10:01 Percent of Wound Bed Debrided: 100% T Area Debrided (cm): otal 9.42 Tissue and other material debrided: Viable, Non-Viable, Slough, Subcutaneous, Biofilm, Slough Level: Skin/Subcutaneous Tissue Debridement Description: Excisional Instrument: Curette Bleeding: Minimum Hemostasis Achieved: Pressure Response to Treatment: Procedure was tolerated well Level of Consciousness (Post- Awake and Alert procedure): Post Debridement Measurements of Total Wound Length: (cm) 3 Width: (cm) 4 Depth: (cm) 0.2 Volume: (cm)  1.084 Character of Wound/Ulcer Post Debridement: Stable Severity of Tissue Post Debridement: Fat layer exposed Post Procedure Diagnosis Same as Pre-procedure Electronic Signature(s) Signed: 05/05/2023 4:38:23 PM By: Angelina Pih Signed: 05/05/2023 5:10:42 PM By: Valentina Shaggy ([id number]) [id number] Page 3 of 9 Signed: 05/06/2023 2:02:35 PM By: [name] PA-C Entered By: Betha Loa on 05/05/2023 07:01:40 -------------------------------------------------------------------------------- HPI Details Patient Name: Date of Service: Ryan [name] RLES A. 05/05/2023 9:00 A M Medical Record Number: [id number] Patient Account Number: [id number] Date of Birth/Sex: Treating RN: 05/30/1971 (52 y.o. Laymond Purser Primary Care Provider: [name] Other Clinician: Betha Loa  Referring Provider: Treating Provider/Extender: [name] in Treatment: 2 History of Present Illness Chronic/Inactive Conditions Condition 1: 04-18-2023 patient screening ABI today was 0.83 and this was on the left. With that being said he had previous ABIs done formally on 06-25-2021 with a right ABI of 1.12 and a TBI of 1.03 on the left she had an ABI of 1.12 with a TBI of 0.98. HPI Description: 04-18-2023 upon evaluation today patient presents for initial inspection here in our clinic some concerning issues he is having with his left proximal foot as well as his left lower extremity. This actually appears to be more of a lymphedema type situation to be honest. He does have chronic lower extremity swelling secondary to what appears to be venous stasis he has not really had any wounds significantly before and tells me that it has been a number of years probably even 12 or so since he has last used his lymphedema pumps. He is not even sure if they are actually functioning anymore or not. With that being said this is 1 thing that I did want him to look  into. He is on a fluid pill and he does not wear compression socks on a regular basis although I definitely think that something that he also really needs to be doing to be honest. He does not have any injury that occurred to cause these wounds they more or less spontaneously occurred as a result of what appears to be excessive swelling. Patient does have a history of diabetes mellitus type 2, congestive heart failure, lymphedema, venous hypertension, hypertension, peripheral vascular disease, and chronic kidney disease stage III. 8/26; this is a patient with chronic lymphedema. He has wounds on the left medial lower extremity and left medial foot. We have been using Hydrofera Blue and Urgo K2. He tells me he has a compression pump at home although it is missing apart and he is not really familiar with with which company to call. He also started on furosemide 20 mg every other day probably would do better with every day and he is talking to his physician about this. 05-05-2023 upon evaluation today patient appears to be doing pretty well currently in regard to his wound. Fortunately there does not appear to be any signs of infection he does have some slough and biofilm noted I did discuss with the patient today I do believe that he would benefit from a continuation of therapy with regard to his wound. I think we may need to increase the compression however and also think that he may benefit from targeted debridement today. Electronic Signature(s) Signed: 05/05/2023 10:13:18 AM By: [name] PA-C Entered By: [name] on 05/05/2023 07:13:18 -------------------------------------------------------------------------------- Physical Exam Details Patient Name: Date of Service: Ryan [name] RLES A. 05/05/2023 9:00 A M Medical Record Number: [id number] Patient Account Number: [id number] Date of Birth/Sex: Treating RN: October 24, 1970 (52 y.o. Laymond Purser Primary Care Provider: [name] Other  Clinician: Betha Loa Referring Provider: Treating Provider/Extender: [name] in Treatment: 2 BLANCHARD, ALESI ([id number]) [id number] Page 4 of 9 Constitutional Obese and well-hydrated in no acute distress. Respiratory normal breathing without difficulty. Psychiatric this patient is able to make decisions and demonstrates good insight into disease process. Alert and Oriented x 3. pleasant and cooperative. Notes Upon inspection patient's wound bed actually did require sharp debridement of both locations as far as the main to wound openings are concerned. I removed slough and biofilm down to good subcutaneous  tissue he tolerated that today without complication postdebridement this looks to be doing much better. Electronic Signature(s) Signed: 05/05/2023 10:13:39 AM By: [name] PA-C Entered By: [name] on 05/05/2023 07:13:39 -------------------------------------------------------------------------------- Physician Orders Details Patient Name: Date of Service: Ryan [name] RLES A. 05/05/2023 9:00 A M Medical Record Number: [id number] Patient Account Number: [id number] Date of Birth/Sex: Treating RN: 02/01/1971 (52 y.o. Laymond Purser Primary Care Provider: [name] Other Clinician: Betha Loa Referring Provider: Treating Provider/Extender: [name] in Treatment: 2 Verbal / Phone Orders: Yes Clinician: Angelina Pih Read Back and Verified: Yes Diagnosis Coding ICD-10 Coding Code Description E11.622 Type 2 diabetes mellitus with other skin ulcer L97.822 Non-pressure chronic ulcer of other part of left lower leg with fat layer exposed E11.621 Type 2 diabetes mellitus with foot ulcer L97.522 Non-pressure chronic ulcer of other part of left foot with fat layer exposed I50.42 Chronic combined systolic (congestive) and diastolic (congestive) heart failure I89.0 Lymphedema, not  elsewhere classified 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 Bathing/ Shower/ Hygiene May shower with wound dressing protected with water repellent cover or cast protector. Anesthetic (Use 'Patient Medications' Section for Anesthetic Order Entry) Lidocaine applied to wound bed Edema Control - Lymphedema / Segmental Compressive Device / Other Elevate, Exercise Daily and A void Standing for Long Periods of Time. Elevate legs to the level of the heart and pump ankles as often as possible Elevate leg(s) parallel to the floor when sitting. Wound Treatment Wound #1 - Lower Leg Wound Laterality: Left, Medial Cleanser: Soap and Water 1 x Per Week/30 Days JAHN, MAROS A ([id number]) [id number] Page 5 of 9 Discharge Instructions: Gently cleanse wound with antibacterial soap, rinse and pat dry prior to dressing wounds Cleanser: Wound Cleanser 1 x Per Week/30 Days Discharge Instructions: Wash your hands with soap and water. Remove old dressing, discard into plastic bag and place into trash. Cleanse the wound with Wound Cleanser prior to applying a clean dressing using gauze sponges, not tissues or cotton balls. Do not scrub or use excessive force. Pat dry using gauze sponges, not tissue or cotton balls. Prim Dressing: Hydrofera Blue Ready Transfer Foam, 2.5x2.5 (in/in) 1 x Per Week/30 Days ary Discharge Instructions: Apply Hydrofera Blue Ready to wound bed as directed Secondary Dressing: ABD Pad 5x9 (in/in) 1 x Per Week/30 Days Discharge Instructions: Cover with ABD pad Compression Wrap: Urgo K2, two layer compression system, large 1 x Per Week/30 Days Wound #2 - Foot Wound Laterality: Left, Medial Cleanser: Soap and  Water 1 x Per Week/30 Days Discharge Instructions: Gently cleanse wound with antibacterial soap, rinse and pat dry prior to dressing wounds Cleanser: Wound Cleanser 1 x Per Week/30 Days Discharge Instructions: Wash your hands with soap and water. Remove old dressing, discard into plastic bag and place into trash. Cleanse the wound with Wound Cleanser prior to applying a clean dressing using gauze sponges, not tissues or cotton balls. Do not scrub or use excessive force. Pat dry using gauze sponges, not tissue or cotton balls. Prim Dressing: Hydrofera Blue Ready Transfer Foam, 2.5x2.5 (in/in) 1 x Per Week/30 Days ary Discharge Instructions: Apply Hydrofera Blue Ready to wound bed as directed Secondary Dressing: ABD Pad 5x9 (in/in) 1 x Per Week/30 Days Discharge Instructions: Cover with ABD pad Compression Wrap: Urgo K2, two layer compression system, large  1 x Per Week/30 Days Electronic Signature(s) Signed: 05/05/2023 5:10:42 PM By: Betha Loa Signed: 05/06/2023 2:02:35 PM By: [name] PA-C Entered By: Betha Loa on 05/05/2023 07:20:52 -------------------------------------------------------------------------------- Problem List Details Patient Name: Date of Service: Ryan [name] RLES A. 05/05/2023 9:00 A M Medical Record Number: [id number] Patient Account Number: [id number] Date of Birth/Sex: Treating RN: Aug 01, 1971 (52 y.o. 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 exposed8/19/2024 No Yes E11.621 Type 2 diabetes mellitus with foot ulcer 04/18/2023 No Yes Neidig, Ladonte A ([id number])  [id number] Page 6 of 9 [phone] Non-pressure chronic ulcer of other part of left foot with fat layer exposed 04/18/2023 No Yes I50.42 Chronic combined systolic (congestive) and diastolic (congestive) heart failure 04/18/2023 No Yes I89.0 Lymphedema, not elsewhere classified 04/18/2023 No Yes I87.332 Chronic venous hypertension (idiopathic) with ulcer and inflammation of left 04/18/2023 No Yes lower extremity I10 Essential (primary) hypertension 04/18/2023 No Yes I73.89 Other specified peripheral vascular diseases 04/18/2023 No Yes N18.30 Chronic kidney disease, stage 3 unspecified 04/18/2023 No Yes Inactive Problems Resolved Problems Electronic Signature(s) Signed: 05/05/2023 9:14:02 AM By: [name] PA-C Entered By: [name] on 05/05/2023 06:14:02 -------------------------------------------------------------------------------- Progress Note Details Patient Name: Date of Service: Ryan [name] RLES A. 05/05/2023 9:00 A M Medical Record Number: [id number] Patient Account Number: [id number] Date of Birth/Sex: Treating RN: Sep 09, 1970 (52 y.o. Laymond Purser Primary Care Provider: [name] Other Clinician: Betha Loa Referring Provider: Treating Provider/Extender: [name] in Treatment: 2 Subjective Chief Complaint Information obtained from Patient Left leg and left foot ulcers History of Present Illness (HPI) Chronic/Inactive Condition: 04-18-2023 patient screening ABI today was 0.83 and this was on the left. With that being said he had previous ABIs done formally on 06-25-2021 with a right ABI of 1.12 and a TBI of 1.03 on the left she had an ABI of 1.12 with a TBI of 0.98. 04-18-2023 upon evaluation today patient presents for initial inspection here in our clinic some concerning issues he is having with his left proximal foot as well Magley, Dmari A ([id number]) [id number] Page 7 of 9 as his left  lower extremity. This actually appears to be more of a lymphedema type situation to be honest. He does have chronic lower extremity swelling secondary to what appears to be venous stasis he has not really had any wounds significantly before and tells me that it has been a number of years probably even 12 or so since he has last used his lymphedema pumps. He is not even sure if they are actually functioning anymore or not. With that being said this is 1 thing that I did want him to look into. He is on a fluid pill and he does not wear compression socks on a regular basis although I definitely think that something that he also really needs to be doing to be honest. He does not have any injury that occurred to cause these wounds they more or less spontaneously occurred as a result of what appears to be excessive swelling. Patient does have a history of diabetes mellitus type 2, congestive heart failure, lymphedema, venous hypertension, hypertension, peripheral vascular disease, and chronic kidney disease stage III. 8/26; this is a patient with  chronic lymphedema. He has wounds on the left medial lower extremity and left medial foot. We have been using Hydrofera Blue and Urgo K2. He tells me he has a compression pump at home although it is missing apart and he is not really familiar with with which company to call. He also started on furosemide 20 mg every other day probably would do better with every day and he is talking to his physician about this. 05-05-2023 upon evaluation today patient appears to be doing pretty well currently in regard to his wound. Fortunately there does not appear to be any signs of infection he does have some slough and biofilm noted I did discuss with the patient today I do believe that he would benefit from a continuation of therapy with regard to his wound. I think we may need to increase the compression however and also think that he may benefit from targeted debridement  today. Objective Constitutional Obese and well-hydrated in no acute distress. Vitals Time Taken: 9:19 AM, Height: 67 in, Weight: 338 lbs, BMI: 52.9, Temperature: 97.6 F, Pulse: 81 bpm, Respiratory Rate: 18 breaths/min, Blood Pressure: 137/97 mmHg. Respiratory normal breathing without difficulty. Psychiatric this patient is able to make decisions and demonstrates good insight into disease process. Alert and Oriented x 3. pleasant and cooperative. General Notes: Upon inspection patient's wound bed actually did require sharp debridement of both locations as far as the main to wound openings are concerned. I removed slough and biofilm down to good subcutaneous tissue he tolerated that today without complication postdebridement this looks to be doing much better. Integumentary (Hair, Skin) Wound #1 status is Open. Original cause of wound was Gradually Appeared. The date acquired was: 02/28/2023. The wound has been in treatment 2 weeks. The wound is located on the Left,Medial Lower Leg. The wound measures 2.3cm length x 2cm width x 0.3cm depth; 3.613cm^2 area and 1.084cm^3 volume. There is Fat Layer (Subcutaneous Tissue) exposed. There is no tunneling or undermining noted. There is a medium amount of serosanguineous drainage noted. There is medium (34-66%) pink granulation within the wound bed. There is a medium (34-66%) amount of necrotic tissue within the wound bed including Adherent Slough. Wound #2 status is Open. Original cause of wound was Gradually Appeared. The date acquired was: 02/28/2023. The wound has been in treatment 2 weeks. The wound is located on the Left,Medial Foot. The wound measures 3cm length x 4cm width x 0.2cm depth; 9.425cm^2 area and 1.885cm^3 volume. There is Fat Layer (Subcutaneous Tissue) exposed. There is no tunneling or undermining noted. There is a medium amount of serosanguineous drainage noted. There is small (1-33%) pink granulation within the wound bed. There is a large  (67-100%) amount of necrotic tissue within the wound bed including Adherent Slough. 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 There was a Excisional Skin/Subcutaneous Tissue Debridement with a total area  of 3.61 sq cm performed by [name], PA-C. With the following instrument(s): Curette to remove Viable and Non-Viable tissue/material. Material removed includes Subcutaneous Tissue, Slough, and Biofilm. A time out was conducted at 09:59, prior to the start of the procedure. A Minimum amount of bleeding was controlled with Pressure. The procedure was STANLEY, MERLE ([id number]) [id number] Page 8 of 9 tolerated well. Post Debridement Measurements: 2.3cm length x 2cm width x 0.3cm depth; 1.084cm^3 volume. Character of Wound/Ulcer Post Debridement is stable. Severity of Tissue Post Debridement is: Fat layer exposed. Post procedure Diagnosis Wound #1: Same as Pre-Procedure Pre-procedure diagnosis of Wound #1 is a Diabetic Wound/Ulcer of the Lower Extremity located on the Left,Medial Lower Leg . There was a Double Layer Compression Therapy Procedure with a pre-treatment ABI of 0.8 by Betha Loa. Post  procedure Diagnosis Wound #1: Same as Pre-Procedure Wound #2 Pre-procedure diagnosis of Wound #2 is a Diabetic Wound/Ulcer of the Lower Extremity located on the Left,Medial Foot .Severity of Tissue Pre Debridement is: Fat layer exposed. There was a Excisional Skin/Subcutaneous Tissue Debridement with a total area of 9.42 sq cm performed by [name], PA-C. With the following instrument(s): Curette to remove Viable and Non-Viable tissue/material. Material removed includes Subcutaneous Tissue, Slough, and Biofilm. A time out was conducted at 10:01, prior to the start of the procedure. A Minimum amount of bleeding was controlled with Pressure. The procedure was tolerated well. Post Debridement Measurements: 3cm length x 4cm width x 0.2cm depth; 1.084cm^3 volume. Character of Wound/Ulcer Post Debridement is stable. Severity of Tissue Post Debridement is: Fat layer exposed. Post procedure Diagnosis Wound #2: Same as Pre-Procedure Plan Follow-up Appointments: Return Appointment in 1 week. Bathing/ Shower/ Hygiene: May shower with wound dressing protected with water repellent cover or cast protector. Anesthetic (Use 'Patient Medications' Section for Anesthetic Order Entry): Lidocaine applied to wound bed Edema Control - Lymphedema / Segmental Compressive Device / Other: Elevate, Exercise Daily and Avoid Standing for Long Periods of Time. Elevate legs to the level of the heart and pump ankles as often as possible Elevate leg(s) parallel to the floor when sitting. WOUND #1: - Lower Leg Wound Laterality: Left, Medial Cleanser: Soap and Water 1 x Per Week/30 Days Discharge Instructions: Gently cleanse wound with antibacterial soap, rinse and pat dry prior to dressing wounds Cleanser: Wound Cleanser 1 x Per Week/30 Days Discharge Instructions: Wash your hands with soap and water. Remove old dressing, discard into plastic bag and place into trash. Cleanse the wound with Wound Cleanser prior to applying  a clean dressing using gauze sponges, not tissues or cotton balls. Do not scrub or use excessive force. Pat dry using gauze sponges, not tissue or cotton balls. Prim Dressing: Hydrofera Blue Ready Transfer Foam, 2.5x2.5 (in/in) 1 x Per Week/30 Days ary Discharge Instructions: Apply Hydrofera Blue Ready to wound bed as directed Secondary Dressing: ABD Pad 5x9 (in/in) 1 x Per Week/30 Days Discharge Instructions: Cover with ABD pad Com pression Wrap: Urgo K2, two layer compression system, large 1 x Per Week/30 Days WOUND #2: - Foot Wound Laterality: Left, Medial Cleanser: Soap and Water 1 x Per Week/30 Days Discharge Instructions: Gently cleanse wound with antibacterial soap, rinse and pat dry prior to dressing wounds Cleanser: Wound Cleanser 1 x Per Week/30 Days Discharge Instructions: Wash your hands with soap and water. Remove old dressing, discard into plastic bag and place into trash. Cleanse the wound with Wound Cleanser prior to applying a clean dressing using gauze sponges, not tissues or  cotton balls. Do not scrub or use excessive force. Pat dry using gauze sponges, not tissue or cotton balls. Prim Dressing: Hydrofera Blue Ready Transfer Foam, 2.5x2.5 (in/in) 1 x Per Week/30 Days ary Discharge Instructions: Apply Hydrofera Blue Ready to wound bed as directed Secondary Dressing: ABD Pad 5x9 (in/in) 1 x Per Week/30 Days Discharge Instructions: Cover with ABD pad Com pression Wrap: Urgo K2, two layer compression system, large 1 x Per Week/30 Days 1. I would recommend that we have the patient continue to elevate his leg is much as possible. 2. I am going to recommend that we increase to the 4-layer equivalent compression wrap he is in agreement with plan. 3. I am also going to recommend that he should continue with the [facility] followed by ABD pads. We will see patient back for reevaluation in 1 week here in the clinic. If anything worsens or changes patient will contact our office  for additional recommendations. Electronic Signature(s) Signed: 05/05/2023 10:14:23 AM By: [name] PA-C Entered By: [name] on 05/05/2023 07:14:23 Baranski, Bing Neighbors ([id number]) [id number] Page 9 of 9 -------------------------------------------------------------------------------- SuperBill Details Patient Name: Date of Service: FENDER, STANSBERRY A. 05/05/2023 Medical Record Number: [id number] Patient Account Number: [id number] Date of Birth/Sex: Treating RN: February 23, 1971 (52 y.o. 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 Lymphedema, not elsewhere classified 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 I10 Essential (primary) hypertension I73.89 Other specified peripheral vascular diseases N18.30 Chronic kidney disease, stage 3 unspecified Facility Procedures : CPT4 Code: [id number] Description: 11042 - DEB SUBQ TISSUE 20 SQ CM/< ICD-10 Diagnosis Description L97.822 Non-pressure chronic ulcer of other part of left lower leg with fat layer expo Modifier: sed Quantity: 1 Physician Procedures : CPT4 Code Description Modifier [id number] 11042 - WC PHYS SUBQ TISS 20 SQ CM ICD-10 Diagnosis Description L97.822 Non-pressure chronic ulcer of other part of left lower leg with fat layer exposed Quantity: 1 Electronic Signature(s) Signed: 05/05/2023 10:14:35  AM By: [name] PA-C Entered By: [name] on 05/05/2023 07:14:35

## 2023-05-05 NOTE — Progress Notes (Addendum)
Ryan, [name] [name] ([id number]) [id number] Page 1 of 11 Visit Report for 05/05/2023 Arrival Information Details Patient Name: Date of Service: Ryan [name], Ryan [name]. 05/05/2023 9:00 Ryan [name] Medical Record Number: [id number] Patient Account Number: [id number] Date of Birth/Sex: Treating RN: 08/31/1970 (52 y.o. Ryan [name] Primary Care Ryan [name]: [name] Other Clinician: Betha Loa Referring Ryan [name]: Treating Ryan [name]/Extender: [name] in Treatment: 2 Visit Information History Since Last Visit All ordered tests and consults were completed: No Patient Arrived: Ambulatory Added or deleted any medications: No Arrival Time: 09:14 Any new allergies or adverse reactions: No Transfer Assistance: None Had [name] fall or experienced change in No Patient Identification Verified: Yes activities of daily living that may affect Secondary Verification Process Completed: Yes risk of falls: Patient Has Alerts: Yes Signs or symptoms of abuse/neglect since last visito No Patient Alerts: Patient on Blood Thinner Hospitalized since last visit: No diabetic Implantable device outside of the clinic excluding No cellular tissue based products placed in the center since last visit: Has Dressing in Place as Prescribed: Yes Has Compression in Place as Prescribed: Yes Pain Present Now: Yes Electronic Signature(s) Signed: 05/05/2023 5:10:42 PM By: Betha Loa Entered By: Betha Loa on 05/05/2023 06:18:10 -------------------------------------------------------------------------------- Clinic Level of Care Assessment Details Patient Name: Date of Service: Ryan [name], Ryan [name]. 05/05/2023 9:00 Ryan [name] Medical Record Number: [id number] Patient Account Number: [id number] Date of Birth/Sex: Treating RN: 1971-06-08 (52 y.o. Ryan [name] Primary Care Ryan [name]: [name] Other Clinician: Betha Loa Referring Mieshia Pepitone: Treating Ryan [name]/Extender:  [name] in Treatment: 2 Clinic Level of Care Assessment Items TOOL 4 Quantity Score []  - 0 Use when only an EandM is performed on FOLLOW-UP visit ASSESSMENTS - Nursing Assessment / Reassessment []  - 0 Reassessment of Co-morbidities (includes updates in patient status) Ryan [name] [name] ([id number]) [id number] Page 2 of 11 []  - 0 Reassessment of Adherence to Treatment Plan ASSESSMENTS - Wound and Skin [name] ssessment / Reassessment []  - 0 Simple Wound Assessment / Reassessment - one wound []  - 0 Complex Wound Assessment / Reassessment - multiple wounds []  - 0 Dermatologic / Skin Assessment (not related to wound area) ASSESSMENTS - Focused Assessment []  - 0 Circumferential Edema Measurements - multi extremities []  - 0 Nutritional Assessment / Counseling / Intervention []  - 0 Lower Extremity Assessment (monofilament, tuning fork, pulses) []  - 0 Peripheral Arterial Disease Assessment (using hand held doppler) ASSESSMENTS - Ostomy and/or Continence Assessment and Care []  - 0 Incontinence Assessment and Management []  - 0 Ostomy Care Assessment and Management (repouching, etc.) PROCESS - Coordination of Care []  - 0 Simple Patient / Family Education for ongoing care []  - 0 Complex (extensive) Patient / Family Education for ongoing care []  - 0 Staff obtains Chiropractor, Records, T Results / Process Orders est []  - 0 Staff telephones HHA, Nursing Homes / Clarify orders / etc []  - 0 Routine Transfer to another Facility (non-emergent condition) []  - 0 Routine Hospital Admission (non-emergent condition) []  - 0 New Admissions / Manufacturing engineer / Ordering NPWT Apligraf, etc. , []  - 0 Emergency Hospital Admission (emergent condition) []  - 0 Simple Discharge Coordination []  - 0 Complex (extensive) Discharge Coordination PROCESS - Special Needs []  - 0 Pediatric / Minor Patient Management []  - 0 Isolation Patient  Management []  - 0 Hearing / Language / Visual special needs []  - 0 Assessment of Community assistance (transportation, D/C planning, etc.) []  - 0 Additional assistance / Altered mentation []  - 0 Support Surface(s)  Assessment (bed, cushion, seat, etc.) INTERVENTIONS - Wound Cleansing / Measurement []  - 0 Simple Wound Cleansing - one wound []  - 0 Complex Wound Cleansing - multiple wounds []  - 0 Wound Imaging (photographs - any number of wounds) []  - 0 Wound Tracing (instead of photographs) []  - 0 Simple Wound Measurement - one wound []  - 0 Complex Wound Measurement - multiple wounds INTERVENTIONS - Wound Dressings []  - 0 Small Wound Dressing one or multiple wounds []  - 0 Medium Wound Dressing one or multiple wounds []  - 0 Large Wound Dressing one or multiple wounds []  - 0 Application of Medications - topical []  - 0 Application of Medications - injection INTERVENTIONS - Miscellaneous Ryan [name] ([id number]) [id number] Page 3 of 11 []  - 0 External ear exam []  - 0 Specimen Collection (cultures, biopsies, blood, body fluids, etc.) []  - 0 Specimen(s) / Culture(s) sent or taken to Lab for analysis []  - 0 Patient Transfer (multiple staff / Michiel Sites Lift / Similar devices) []  - 0 Simple Staple / Suture removal (25 or less) []  - 0 Complex Staple / Suture removal (26 or more) []  - 0 Hypo / Hyperglycemic Management (close monitor of Blood Glucose) []  - 0 Ankle / Brachial Index (ABI) - do not check if billed separately []  - 0 Vital Signs Has the patient been seen at the hospital within the last three years: Yes Total Score: 0 Level Of Care: ____ Electronic Signature(s) Signed: 05/05/2023 5:10:42 PM By: Betha Loa Entered By: Betha Loa on 05/05/2023 07:02:24 -------------------------------------------------------------------------------- Compression Therapy Details Patient Name: Date of Service: Ryan [name]. 05/05/2023 9:00 [name]  [name] Medical Record Number: [id number] Patient Account Number: [id number] Date of Birth/Sex: Treating RN: Feb 26, 1971 (52 y.o. Ryan [name] Primary Care Maricarmen Braziel: [name] Other Clinician: Betha Loa Referring Ryan Lotts: Treating Marciano Mundt/Extender: [name] in Treatment: 2 Compression Therapy Performed for Wound Assessment: Wound #1 Left,Medial Lower Leg Performed By: Farrel Gordon, Angie, Compression Type: Double Layer Pre Treatment ABI: 0.8 Post Procedure Diagnosis Same as Pre-procedure Electronic Signature(s) Signed: 05/05/2023 5:10:42 PM By: Betha Loa Entered By: Betha Loa on 05/05/2023 07:02:03 -------------------------------------------------------------------------------- Encounter Discharge Information Details Patient Name: Date of Service: Ryan [name]. 05/05/2023 9:00 [name] Ryan [name], Ryan [name] ([id number]) [id number] Page 4 of 11 Medical Record Number: [id number] Patient Account Number: [id number] Date of Birth/Sex: Treating RN: 22-Jan-1971 (52 y.o. Ryan [name] Primary Care Lerline Valdivia: [name] Other Clinician: Betha Loa Referring Tyberius Ryner: Treating Novalyn Lajara/Extender: [name] in Treatment: 2 Encounter Discharge Information Items Post Procedure Vitals Discharge Condition: Stable Temperature (F): 97.6 Ambulatory Status: Ambulatory Pulse (bpm): 81 Discharge Destination: Home Respiratory Rate (breaths/min): 18 Transportation: Private Auto Blood Pressure (mmHg): 137/97 Accompanied By: self Schedule Follow-up Appointment: Yes Clinical Summary of Care: Electronic Signature(s) Signed: 05/05/2023 5:10:42 PM By: Betha Loa Entered By: Betha Loa on 05/05/2023 07:21:39 -------------------------------------------------------------------------------- Lower Extremity Assessment Details Patient Name: Date of Service: Ryan [name]. 05/05/2023 9:00 [name]  [name] Medical Record Number: [id number] Patient Account Number: [id number] Date of Birth/Sex: Treating RN: 03/10/1971 (53 y.o. Ryan [name] Primary Care Paola Flynt: [name] Other Clinician: Betha Loa Referring Oreoluwa Aigner: Treating Everlee Quakenbush/Extender: [name] in Treatment: 2 Edema Assessment Assessed: [Left: Yes] [Right: No] Edema: [Left: Ye] [Right: s] Calf Left: Right: Point of Measurement: 35 cm From Medial Instep 51.7 cm Ankle Left: Right: Point of Measurement: 10 cm From Medial Instep 32.5 cm Vascular Assessment Pulses: Dorsalis Pedis Palpable: [Left:Yes] Toe Nail Assessment  Left: Right: Thick: No Discolored: Yes Deformed: No Improper Length and Hygiene: No Electronic Signature(s) Signed: 05/05/2023 4:38:23 PM By: Angelina Pih Signed: 05/05/2023 5:10:42 PM By: Betha Loa Entered By: Betha Loa on 05/05/2023 06:30:34 Bezek, Bing Neighbors ([id number]) [id number] Page 5 of 11 -------------------------------------------------------------------------------- Multi Wound Chart Details Patient Name: Date of Service: Ryan [name], Ryan [name]. 05/05/2023 9:00 Ryan [name] Medical Record Number: [id number] Patient Account Number: [id number] Date of Birth/Sex: Treating RN: Jan 18, 1971 (52 y.o. Ryan [name] Primary Care Kelcey Korus: [name] Other Clinician: Betha Loa Referring Toren Tucholski: Treating Lakiesha Ralphs/Extender: [name] in Treatment: 2 Vital Signs Height(in): 67 Pulse(bpm): 81 Weight(lbs): 338 Blood Pressure(mmHg): 137/97 Body Mass Index(BMI): 52.9 Temperature(F): 97.6 Respiratory Rate(breaths/min): 18 [1:Photos:] [N/[name]:N/[name]] Left, Medial Lower Leg Left, Medial Foot N/[name] Wound Location: Gradually Appeared Gradually Appeared N/[name] Wounding Event: Diabetic Wound/Ulcer of the Lower Diabetic Wound/Ulcer of the Lower N/[name] Primary Etiology: Extremity Extremity Arrhythmia, Congestive  Heart Failure, Arrhythmia, Congestive Heart Failure, N/[name] Comorbid History: Hypertension, Peripheral Venous Hypertension, Peripheral Venous Disease, Type II Diabetes Disease, Type II Diabetes 02/28/2023 02/28/2023 N/[name] Date Acquired: 2 2 N/[name] Weeks of Treatment: Open Open N/[name] Wound Status: No No N/[name] Wound Recurrence: 2.3x2x0.3 3x4x0.2 N/[name] Measurements [name] x W x D (cm) 3.613 9.425 N/[name] [name] (cm) : rea 1.084 1.885 N/[name] Volume (cm) : 8.00% -269.20% N/[name] % Reduction in [name] rea: 8.00% -268.90% N/[name] % Reduction in Volume: Grade 2 Grade 1 N/[name] Classification: Medium Medium N/[name] Exudate [name] mount: Serosanguineous Serosanguineous N/[name] Exudate Type: red, brown red, brown N/[name] Exudate Color: Medium (34-66%) Small (1-33%) N/[name] Granulation [name] mount: Pink Pink N/[name] Granulation Quality: Medium (34-66%) Large (67-100%) N/[name] Necrotic [name] mount: Fat Layer (Subcutaneous Tissue): Yes Fat Layer (Subcutaneous Tissue): Yes N/[name] Exposed Structures: Fascia: No Fascia: No Tendon: No Tendon: No Muscle: No Muscle: No Joint: No Joint: No Bone: No Bone: No None None N/[name] Epithelialization: Treatment Notes Electronic Signature(s) Signed: 05/05/2023 5:10:42 PM By: Valentina Shaggy ([id number]) [id number] Page 6 of 11 Entered By: Betha Loa on 05/05/2023 06:30:42 -------------------------------------------------------------------------------- Multi-Disciplinary Care Plan Details Patient Name: Date of Service: Ryan [name], Ryan [name]. 05/05/2023 9:00 Ryan [name] Medical Record Number: [id number] Patient Account Number: [id number] Date of Birth/Sex: Treating RN: Dec 08, 1970 (52 y.o. Ryan [name] Primary Care Alvira Hecht: [name] Other Clinician: Betha Loa Referring Tanica Gaige: Treating Amena Dockham/Extender: [name] in Treatment: 2 Active Inactive Necrotic Tissue Nursing Diagnoses: Knowledge deficit related to management of necrotic/devitalized  tissue Goals: Patient/caregiver will verbalize understanding of reason and process for debridement of necrotic tissue Date Initiated: 04/18/2023 Target Resolution Date: 05/19/2023 Goal Status: Active Interventions: Assess patient pain level pre-, during and post procedure and prior to discharge Notes: Wound/Skin Impairment Nursing Diagnoses: Knowledge deficit related to ulceration/compromised skin integrity Goals: Patient/caregiver will verbalize understanding of skin care regimen Date Initiated: 04/18/2023 Target Resolution Date: 05/19/2023 Goal Status: Active Ulcer/skin breakdown will have [name] volume reduction of 30% by week 4 Date Initiated: 04/18/2023 Target Resolution Date: 05/19/2023 Goal Status: Active Ulcer/skin breakdown will have [name] volume reduction of 50% by week 8 Date Initiated: 04/18/2023 Target Resolution Date: 06/18/2023 Goal Status: Active Ulcer/skin breakdown will have [name] volume reduction of 80% by week 12 Date Initiated: 04/18/2023 Target Resolution Date: 07/19/2023 Goal Status: Active Ulcer/skin breakdown will heal within 14 weeks Date Initiated: 04/18/2023 Target Resolution Date: 08/18/2023 Goal Status: Active Interventions: Assess patient/caregiver ability to obtain necessary supplies Assess patient/caregiver ability to perform ulcer/skin care regimen upon admission and as needed Assess ulceration(s) every  visit Notes: Electronic Signature(s) Signed: 05/05/2023 4:38:23 PM By: Angelina Pih Signed: 05/05/2023 5:10:42 PM By: Valentina Shaggy ([id number]) [id number] Page 7 of 11 Entered By: Betha Loa on 05/05/2023 07:02:38 -------------------------------------------------------------------------------- Pain Assessment Details Patient Name: Date of Service: Ryan [name], Ryan [name]. 05/05/2023 9:00 Ryan [name] Medical Record Number: [id number] Patient Account Number: [id number] Date of Birth/Sex: Treating RN: 1970/09/26 (53 y.o. Ryan [name] Primary Care Tiya Schrupp: [name] Other Clinician: Betha Loa Referring Yoali Conry: Treating O[place]wa Kinyon/Extender: [name] in Treatment: 2 Active Problems Location of Pain Severity and Description of Pain Patient Has Paino Yes Site Locations Pain Location: Pain in Ulcers Duration of the Pain. Constant / Intermittento Constant Rate the pain. Current Pain Level: 7 Character of Pain Describe the Pain: Aching, Other: sorness, prickly Pain Management and Medication Current Pain Management: Medication: Yes Cold Application: No Rest: Yes Massage: No Activity: No T.E.N.S.: No Heat Application: No Leg drop or elevation: No Is the Current Pain Management Adequate: Inadequate How does your wound impact your activities of daily livingo Sleep: No Bathing: No Appetite: No Relationship With Others: No Bladder Continence: No Emotions: No Bowel Continence: No Work: No Toileting: No Drive: No Dressing: No Hobbies: No Electronic Signature(s) Signed: 05/05/2023 4:38:23 PM By: Angelina Pih Signed: 05/05/2023 5:10:42 PM By: Betha Loa Entered By: Betha Loa on 05/05/2023 06:20:59 Paxson, Bing Neighbors ([id number]) [id number] Page 8 of 11 -------------------------------------------------------------------------------- Patient/Caregiver Education Details Patient Name: Date of Service: Ryan [name], Ryan [name]. 9/5/2024andnbsp9:00 Ryan [name] Medical Record Number: [id number] Patient Account Number: [id number] Date of Birth/Gender: Treating RN: 1970-11-18 (52 y.o. Ryan [name] Primary Care Physician: [name] Other Clinician: Betha Loa Referring Physician: Treating Physician/Extender: [name] in Treatment: 2 Education Assessment Education Provided To: Patient Education Topics Provided Wound/Skin Impairment: Handouts: Other: continue wound care as directed Methods:  Explain/Verbal Responses: State content correctly Electronic Signature(s) Signed: 05/05/2023 5:10:42 PM By: Betha Loa Entered By: Betha Loa on 05/05/2023 07:02:57 -------------------------------------------------------------------------------- Wound Assessment Details Patient Name: Date of Service: Ryan [name]. 05/05/2023 9:00 Ryan [name] Medical Record Number: [id number] Patient Account Number: [id number] Date of Birth/Sex: Treating RN: 17-Mar-1971 (52 y.o. Ryan [name] Primary Care Shatasha Lambing: [name] Other Clinician: Betha Loa Referring Naszir Cott: Treating Blayne Garlick/Extender: [name] in Treatment: 2 Wound Status Wound Number: 1 Primary Diabetic Wound/Ulcer of the Lower Extremity Etiology: Wound Location: Left, Medial Lower Leg Wound Open Wounding Event: Gradually Appeared Status: Date Acquired: 02/28/2023 Comorbid Arrhythmia, Congestive Heart Failure, Hypertension, Peripheral Weeks Of Treatment: 2 History: Venous Disease, Type II Diabetes Clustered Wound: No Photos Ryan [name], Ryan [name] ([id number]) [id number] Page 9 of 11 Wound Measurements Length: (cm) 2.3 Width: (cm) 2 Depth: (cm) 0.3 Area: (cm) 3.613 Volume: (cm) 1.084 % Reduction in Area: 8% % Reduction in Volume: 8% Epithelialization: None Tunneling: No Undermining: No Wound Description Classification: Grade 2 Exudate Amount: Medium Exudate Type: Serosanguineous Exudate Color: red, brown Foul Odor After Cleansing: No Slough/Fibrino Yes Wound Bed Granulation Amount: Medium (34-66%) Exposed Structure Granulation Quality: Pink Fascia Exposed: No Necrotic Amount: Medium (34-66%) Fat Layer (Subcutaneous Tissue) Exposed: Yes Necrotic Quality: Adherent Slough Tendon Exposed: No Muscle Exposed: No Joint Exposed: No Bone Exposed: No Treatment Notes Wound #1 (Lower Leg) Wound Laterality: Left, Medial Cleanser Soap and Water Discharge  Instruction: Gently cleanse wound with antibacterial soap, rinse and pat dry prior to dressing wounds Wound Cleanser Discharge Instruction: Wash your hands with soap and water. Remove old dressing, discard into plastic bag  and place into trash. Cleanse the wound with Wound Cleanser prior to applying [name] clean dressing using gauze sponges, not tissues or cotton balls. Do not scrub or use excessive force. Pat dry using gauze sponges, not tissue or cotton balls. Peri-Wound Care Topical Primary Dressing Hydrofera Blue Ready Transfer Foam, 2.5x2.5 (in/in) Discharge Instruction: Apply Hydrofera Blue Ready to wound bed as directed Secondary Dressing ABD Pad 5x9 (in/in) Discharge Instruction: Cover with ABD pad Secured With Compression Wrap Urgo K2, two layer compression system, large Compression Stockings Add-Ons Electronic Signature(s) Signed: 05/05/2023 4:38:23 PM By: Angelina Pih Signed: 05/05/2023 5:10:42 PM By: Betha Loa Entered By: Betha Loa on 05/05/2023 06:29:05 Shadduck, Bing Neighbors ([id number]) [id number] Page 10 of 11 -------------------------------------------------------------------------------- Wound Assessment Details Patient Name: Date of Service: Ryan [name], Ryan [name]. 05/05/2023 9:00 Ryan [name] Medical Record Number: [id number] Patient Account Number: [id number] Date of Birth/Sex: Treating RN: 10-14-1970 (52 y.o. Ryan [name] Primary Care Tyreik Delahoussaye: [name] Other Clinician: Betha Loa Referring Jayston Trevino: Treating Stan Cantave/Extender: [name] in Treatment: 2 Wound Status Wound Number: 2 Primary Diabetic Wound/Ulcer of the Lower Extremity Etiology: Wound Location: Left, Medial Foot Wound Open Wounding Event: Gradually Appeared Status: Date Acquired: 02/28/2023 Comorbid Arrhythmia, Congestive Heart Failure, Hypertension, Peripheral Weeks Of Treatment: 2 History: Venous Disease, Type II Diabetes Clustered Wound:  No Photos Wound Measurements Length: (cm) 3 Width: (cm) 4 Depth: (cm) 0.2 Area: (cm) 9.425 Volume: (cm) 1.885 % Reduction in Area: -269.2% % Reduction in Volume: -268.9% Epithelialization: None Tunneling: No Undermining: No Wound Description Classification: Grade 1 Exudate Amount: Medium Exudate Type: Serosanguineous Exudate Color: red, brown Foul Odor After Cleansing: No Slough/Fibrino Yes Wound Bed Granulation Amount: Small (1-33%) Exposed Structure Granulation Quality: Pink Fascia Exposed: No Necrotic Amount: Large (67-100%) Fat Layer (Subcutaneous Tissue) Exposed: Yes Necrotic Quality: Adherent Slough Tendon Exposed: No Muscle Exposed: No Joint Exposed: No Bone Exposed: No Treatment Notes Wound #2 (Foot) Wound Laterality: Left, Medial Cleanser Soap and Water Discharge Instruction: Gently cleanse wound with antibacterial soap, rinse and pat dry prior to dressing wounds Wound Cleanser Carattini, Kori [name] ([id number]) [id number] Page 11 of 11 Discharge Instruction: Wash your hands with soap and water. Remove old dressing, discard into plastic bag and place into trash. Cleanse the wound with Wound Cleanser prior to applying [name] clean dressing using gauze sponges, not tissues or cotton balls. Do not scrub or use excessive force. Pat dry using gauze sponges, not tissue or cotton balls. Peri-Wound Care Topical Primary Dressing Hydrofera Blue Ready Transfer Foam, 2.5x2.5 (in/in) Discharge Instruction: Apply Hydrofera Blue Ready to wound bed as directed Secondary Dressing ABD Pad 5x9 (in/in) Discharge Instruction: Cover with ABD pad Secured With Compression Wrap Urgo K2, two layer compression system, large Compression Stockings Add-Ons Electronic Signature(s) Signed: 05/05/2023 4:38:23 PM By: Angelina Pih Signed: 05/05/2023 5:10:42 PM By: Betha Loa Entered By: Betha Loa on 05/05/2023  06:29:32 -------------------------------------------------------------------------------- Vitals Details Patient Name: Date of Service: Ryan [name]. 05/05/2023 9:00 Ryan [name] Medical Record Number: [id number] Patient Account Number: [id number] Date of Birth/Sex: Treating RN: [birth year] (52 y.o. Ryan [name] Primary Care Rain Friedt: [name] Other Clinician: Betha Loa Referring Roi Jafari: Treating Corda Shutt/Extender: [name] in Treatment: 2 Vital Signs Time Taken: 09:19 Temperature (F): 97.6 Height (in): 67 Pulse (bpm): 81 Weight (lbs): 338 Respiratory Rate (breaths/min): 18 Body Mass Index (BMI): 52.9 Blood Pressure (mmHg): 137/97 Reference Range: 80 - 120 mg / dl Electronic Signature(s) Signed: 05/05/2023 5:10:42 PM By: Betha Loa Entered By: Glynda Jaeger,  Angie on 05/05/2023 06:20:53

## 2023-05-08 NOTE — Assessment & Plan Note (Addendum)
Chronic, ongoing Currently on Warfarin for management Recheck PT/INR today - in goal today at 2.9  Continue current dosing Follow up in 1 month for recheck or sooner if concerns arise

## 2023-05-08 NOTE — Assessment & Plan Note (Signed)
Chronic, historic condition Pt would like referral to new Pulmonology provider to assist with management Referral placed per request Continue to collaborate with Pulmonology for management

## 2023-05-08 NOTE — Progress Notes (Signed)
Your INR was within goal  Please continue to take your Coumadin as directed

## 2023-05-09 ENCOUNTER — Encounter: Payer: No Typology Code available for payment source | Admitting: Physician Assistant

## 2023-05-09 DIAGNOSIS — E11622 Type 2 diabetes mellitus with other skin ulcer: Secondary | ICD-10-CM | POA: Diagnosis not present

## 2023-05-09 NOTE — Progress Notes (Addendum)
DEVVON, ARNWINE A ([id number]) [id number] Page 1 of 8 Visit Report for 05/09/2023 Chief Complaint Document Details Patient Name: Date of Service: COLBI, MULRY RLES A. 05/09/2023 8:00 A M Medical Record Number: [id number] Patient Account Number: [id number] Date of Birth/Sex: Treating RN: 12-Aug-1971 (52 y.o. [name]) Yevonne Pax Primary Care Provider: [name] Other Clinician: Referring Provider: Treating Provider/Extender: [name] in Treatment: 3 Information Obtained from: Patient Chief Complaint Left leg and left foot ulcers Electronic Signature(s) Signed: 05/09/2023 8:48:04 AM By: [name] PA-C Entered By: [name] on 05/09/2023 08:48:04 -------------------------------------------------------------------------------- HPI Details Patient Name: Date of Service: [name] 05/09/2023 8:00 A M Medical Record Number: [id number] Patient Account Number: [id number] Date of Birth/Sex: Treating RN: 01-Jul-1971 (52 y.o. [name] Primary Care Provider: [name] Other Clinician: Referring Provider: Treating Provider/Extender: [name] in Treatment: 3 History of Present Illness Chronic/Inactive Conditions Condition 1: 04-18-2023 patient screening ABI today was 0.83 and this was on the left. With that being said he had previous ABIs done formally on 06-25-2021 with a right ABI of 1.12 and a TBI of 1.03 on the left she had an ABI of 1.12 with a TBI of 0.98. HPI Description: 04-18-2023 upon evaluation today patient presents for initial inspection here in our clinic some concerning issues he is having with his left proximal foot as well as his left lower extremity. This actually appears to be more of a lymphedema type situation to be honest. He does have chronic lower extremity swelling secondary to what appears to be venous stasis he has not really had any wounds significantly before and tells me that it  has been a number of years probably even 12 or so since he has last used his lymphedema pumps. He is not even sure if they are actually functioning anymore or not. With that being said this is 1 thing that I did want him to look into. He is on a fluid pill and he does not wear compression socks on a regular basis although I definitely think that something that he also really needs to be doing to be honest. He does not have any injury that occurred to cause these wounds they more or less spontaneously occurred as a result of what appears to be excessive swelling. Patient does have a history of diabetes mellitus type 2, congestive heart failure, lymphedema, venous hypertension, hypertension, peripheral vascular disease, and chronic kidney disease stage III. 8/26; this is a patient with chronic lymphedema. He has wounds on the left medial lower extremity and left medial foot. We have been using Hydrofera Blue and Urgo K2. He tells me he has a compression pump at home although it is missing apart and he is not really familiar with with which company to call. He also started on furosemide 20 mg every other day probably would do better with every day and he is talking to his physician about this. NISHAN, SCHMIT A ([id number]) [id number] Page 2 of 8 05-05-2023 upon evaluation today patient appears to be doing pretty well currently in regard to his wound. Fortunately there does not appear to be any signs of infection he does have some slough and biofilm noted I did discuss with the patient today I do believe that he would benefit from a continuation of therapy with regard to his wound. I think we may need to increase the compression however and also think that he may benefit from targeted debridement today. 05-09-2023 upon evaluation today patient  DEVVON, ARNWINE A ([id number]) [id number] Page 1 of 8 Visit Report for 05/09/2023 Chief Complaint Document Details Patient Name: Date of Service: COLBI, MULRY RLES A. 05/09/2023 8:00 A M Medical Record Number: [id number] Patient Account Number: [id number] Date of Birth/Sex: Treating RN: 12-Aug-1971 (52 y.o. [name]) Yevonne Pax Primary Care Provider: [name] Other Clinician: Referring Provider: Treating Provider/Extender: [name] in Treatment: 3 Information Obtained from: Patient Chief Complaint Left leg and left foot ulcers Electronic Signature(s) Signed: 05/09/2023 8:48:04 AM By: [name] PA-C Entered By: [name] on 05/09/2023 08:48:04 -------------------------------------------------------------------------------- HPI Details Patient Name: Date of Service: [name] 05/09/2023 8:00 A M Medical Record Number: [id number] Patient Account Number: [id number] Date of Birth/Sex: Treating RN: 01-Jul-1971 (52 y.o. [name] Primary Care Provider: [name] Other Clinician: Referring Provider: Treating Provider/Extender: [name] in Treatment: 3 History of Present Illness Chronic/Inactive Conditions Condition 1: 04-18-2023 patient screening ABI today was 0.83 and this was on the left. With that being said he had previous ABIs done formally on 06-25-2021 with a right ABI of 1.12 and a TBI of 1.03 on the left she had an ABI of 1.12 with a TBI of 0.98. HPI Description: 04-18-2023 upon evaluation today patient presents for initial inspection here in our clinic some concerning issues he is having with his left proximal foot as well as his left lower extremity. This actually appears to be more of a lymphedema type situation to be honest. He does have chronic lower extremity swelling secondary to what appears to be venous stasis he has not really had any wounds significantly before and tells me that it  has been a number of years probably even 12 or so since he has last used his lymphedema pumps. He is not even sure if they are actually functioning anymore or not. With that being said this is 1 thing that I did want him to look into. He is on a fluid pill and he does not wear compression socks on a regular basis although I definitely think that something that he also really needs to be doing to be honest. He does not have any injury that occurred to cause these wounds they more or less spontaneously occurred as a result of what appears to be excessive swelling. Patient does have a history of diabetes mellitus type 2, congestive heart failure, lymphedema, venous hypertension, hypertension, peripheral vascular disease, and chronic kidney disease stage III. 8/26; this is a patient with chronic lymphedema. He has wounds on the left medial lower extremity and left medial foot. We have been using Hydrofera Blue and Urgo K2. He tells me he has a compression pump at home although it is missing apart and he is not really familiar with with which company to call. He also started on furosemide 20 mg every other day probably would do better with every day and he is talking to his physician about this. NISHAN, SCHMIT A ([id number]) [id number] Page 2 of 8 05-05-2023 upon evaluation today patient appears to be doing pretty well currently in regard to his wound. Fortunately there does not appear to be any signs of infection he does have some slough and biofilm noted I did discuss with the patient today I do believe that he would benefit from a continuation of therapy with regard to his wound. I think we may need to increase the compression however and also think that he may benefit from targeted debridement today. 05-09-2023 upon evaluation today patient  1 x Per Week/30 Days Discharge Instructions: Gently cleanse wound with antibacterial soap, rinse and pat dry prior to dressing wounds Cleanser: Wound Cleanser 1 x Per Week/30 Days Discharge Instructions: Wash your hands with soap and water. Remove old dressing, discard into plastic bag and place into trash. Cleanse the wound with Wound Cleanser prior to applying a clean dressing using gauze sponges, not tissues or cotton balls. Do not scrub or use excessive force. [address] MEKAEL, ANGELOPOULOS A ([id number]) [id number] Page 7 of 8 using gauze sponges, not tissue or cotton balls. Prim Dressing: Hydrofera Blue Ready Transfer Foam, 2.5x2.5 (in/in) 1 x Per Week/30 Days ary Discharge Instructions: Apply Hydrofera Blue Ready to wound bed as directed Secondary Dressing: ABD Pad 5x9 (in/in) 1 x Per Week/30 Days Discharge Instructions: Cover with ABD pad Com pression Wrap: Urgo K2, two layer compression system, large 1 x Per Week/30 Days 1. I recommend currently that we have the patient continue to monitor for any signs of infection or worsening. Based on what I am seeing I do believe that will make an excellent headway towards complete closure. 2. Also can recommend him continue with the [facility] Which is doing great. We will see patient back for reevaluation in 1 week here in the clinic. If anything worsens or changes patient will contact our office for additional recommendations. Electronic Signature(s) Signed: 05/09/2023 9:19:45 AM By: [name] PA-C Entered By: [name] on 05/09/2023 09:19:45 -------------------------------------------------------------------------------- SuperBill Details Patient Name: Date of Service: [name] 05/09/2023 Medical Record Number: [id number] Patient Account Number: [id number] Date of Birth/Sex: Treating RN: 1971/06/18 (52 y.o. [name]) Yevonne Pax Primary  Care Provider: [name] Other Clinician: Referring Provider: Treating Provider/Extender: [name] in Treatment: 3 Diagnosis Coding ICD-10 Codes Code Description [phone] Type 2 diabetes mellitus with other skin ulcer L97.822 Non-pressure chronic ulcer of other part of left lower leg with fat layer exposed E11.621 Type 2 diabetes mellitus with foot ulcer L97.522 Non-pressure chronic ulcer of other part of left foot with fat layer exposed I50.42 Chronic combined systolic (congestive) and diastolic (congestive) heart failure I89.0 Lymphedema, not elsewhere classified 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 I10 Essential (primary) hypertension I73.89 Other specified peripheral vascular diseases N18.30 Chronic kidney disease, stage 3 unspecified Facility Procedures : CPT4 Code: 46962952 Description: (Facility Use Only) 29581LT - APPLY MULTLAY COMPRS LWR LT LEG Modifier: Quantity: 1 Physician Procedures : CPT4 Code Description Modifier [id number] - WC PHYS LEVEL 3 - EST PT ICD-10 Diagnosis Description E11.622 Type 2 diabetes mellitus with other skin ulcer L97.822 Non-pressure chronic ulcer of other part of left lower leg with fat layer exposed  E11.621 Type 2 diabetes mellitus with foot ulcer L97.522 Non-pressure chronic ulcer of other part of left foot with fat layer exposed Brott, Caellum A ([id number]) [id number] Page 8 of 8 Quantity: 1 Electronic Signature(s) Signed: 05/09/2023 9:20:08 AM By: [name] PA-C Previous Signature: 05/09/2023 8:46:49 AM Version By: Yevonne Pax RN Entered By: [name] on 05/09/2023 09:20:07  1 x Per Week/30 Days Discharge Instructions: Gently cleanse wound with antibacterial soap, rinse and pat dry prior to dressing wounds Cleanser: Wound Cleanser 1 x Per Week/30 Days Discharge Instructions: Wash your hands with soap and water. Remove old dressing, discard into plastic bag and place into trash. Cleanse the wound with Wound Cleanser prior to applying a clean dressing using gauze sponges, not tissues or cotton balls. Do not scrub or use excessive force. [address] MEKAEL, ANGELOPOULOS A ([id number]) [id number] Page 7 of 8 using gauze sponges, not tissue or cotton balls. Prim Dressing: Hydrofera Blue Ready Transfer Foam, 2.5x2.5 (in/in) 1 x Per Week/30 Days ary Discharge Instructions: Apply Hydrofera Blue Ready to wound bed as directed Secondary Dressing: ABD Pad 5x9 (in/in) 1 x Per Week/30 Days Discharge Instructions: Cover with ABD pad Com pression Wrap: Urgo K2, two layer compression system, large 1 x Per Week/30 Days 1. I recommend currently that we have the patient continue to monitor for any signs of infection or worsening. Based on what I am seeing I do believe that will make an excellent headway towards complete closure. 2. Also can recommend him continue with the [facility] Which is doing great. We will see patient back for reevaluation in 1 week here in the clinic. If anything worsens or changes patient will contact our office for additional recommendations. Electronic Signature(s) Signed: 05/09/2023 9:19:45 AM By: [name] PA-C Entered By: [name] on 05/09/2023 09:19:45 -------------------------------------------------------------------------------- SuperBill Details Patient Name: Date of Service: [name] 05/09/2023 Medical Record Number: [id number] Patient Account Number: [id number] Date of Birth/Sex: Treating RN: 1971/06/18 (52 y.o. [name]) Yevonne Pax Primary  Care Provider: [name] Other Clinician: Referring Provider: Treating Provider/Extender: [name] in Treatment: 3 Diagnosis Coding ICD-10 Codes Code Description [phone] Type 2 diabetes mellitus with other skin ulcer L97.822 Non-pressure chronic ulcer of other part of left lower leg with fat layer exposed E11.621 Type 2 diabetes mellitus with foot ulcer L97.522 Non-pressure chronic ulcer of other part of left foot with fat layer exposed I50.42 Chronic combined systolic (congestive) and diastolic (congestive) heart failure I89.0 Lymphedema, not elsewhere classified 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 I10 Essential (primary) hypertension I73.89 Other specified peripheral vascular diseases N18.30 Chronic kidney disease, stage 3 unspecified Facility Procedures : CPT4 Code: 46962952 Description: (Facility Use Only) 29581LT - APPLY MULTLAY COMPRS LWR LT LEG Modifier: Quantity: 1 Physician Procedures : CPT4 Code Description Modifier [id number] - WC PHYS LEVEL 3 - EST PT ICD-10 Diagnosis Description E11.622 Type 2 diabetes mellitus with other skin ulcer L97.822 Non-pressure chronic ulcer of other part of left lower leg with fat layer exposed  E11.621 Type 2 diabetes mellitus with foot ulcer L97.522 Non-pressure chronic ulcer of other part of left foot with fat layer exposed Brott, Caellum A ([id number]) [id number] Page 8 of 8 Quantity: 1 Electronic Signature(s) Signed: 05/09/2023 9:20:08 AM By: [name] PA-C Previous Signature: 05/09/2023 8:46:49 AM Version By: Yevonne Pax RN Entered By: [name] on 05/09/2023 09:20:07  1 x Per Week/30 Days Discharge Instructions: Gently cleanse wound with antibacterial soap, rinse and pat dry prior to dressing wounds Cleanser: Wound Cleanser 1 x Per Week/30 Days Discharge Instructions: Wash your hands with soap and water. Remove old dressing, discard into plastic bag and place into trash. Cleanse the wound with Wound Cleanser prior to applying a clean dressing using gauze sponges, not tissues or cotton balls. Do not scrub or use excessive force. [address] MEKAEL, ANGELOPOULOS A ([id number]) [id number] Page 7 of 8 using gauze sponges, not tissue or cotton balls. Prim Dressing: Hydrofera Blue Ready Transfer Foam, 2.5x2.5 (in/in) 1 x Per Week/30 Days ary Discharge Instructions: Apply Hydrofera Blue Ready to wound bed as directed Secondary Dressing: ABD Pad 5x9 (in/in) 1 x Per Week/30 Days Discharge Instructions: Cover with ABD pad Com pression Wrap: Urgo K2, two layer compression system, large 1 x Per Week/30 Days 1. I recommend currently that we have the patient continue to monitor for any signs of infection or worsening. Based on what I am seeing I do believe that will make an excellent headway towards complete closure. 2. Also can recommend him continue with the [facility] Which is doing great. We will see patient back for reevaluation in 1 week here in the clinic. If anything worsens or changes patient will contact our office for additional recommendations. Electronic Signature(s) Signed: 05/09/2023 9:19:45 AM By: [name] PA-C Entered By: [name] on 05/09/2023 09:19:45 -------------------------------------------------------------------------------- SuperBill Details Patient Name: Date of Service: [name] 05/09/2023 Medical Record Number: [id number] Patient Account Number: [id number] Date of Birth/Sex: Treating RN: 1971/06/18 (52 y.o. [name]) Yevonne Pax Primary  Care Provider: [name] Other Clinician: Referring Provider: Treating Provider/Extender: [name] in Treatment: 3 Diagnosis Coding ICD-10 Codes Code Description [phone] Type 2 diabetes mellitus with other skin ulcer L97.822 Non-pressure chronic ulcer of other part of left lower leg with fat layer exposed E11.621 Type 2 diabetes mellitus with foot ulcer L97.522 Non-pressure chronic ulcer of other part of left foot with fat layer exposed I50.42 Chronic combined systolic (congestive) and diastolic (congestive) heart failure I89.0 Lymphedema, not elsewhere classified 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 I10 Essential (primary) hypertension I73.89 Other specified peripheral vascular diseases N18.30 Chronic kidney disease, stage 3 unspecified Facility Procedures : CPT4 Code: 46962952 Description: (Facility Use Only) 29581LT - APPLY MULTLAY COMPRS LWR LT LEG Modifier: Quantity: 1 Physician Procedures : CPT4 Code Description Modifier [id number] 99213 - WC PHYS LEVEL 3 - EST PT ICD-10 Diagnosis Description E11.622 Type 2 diabetes mellitus with other skin ulcer L97.822 Non-pressure chronic ulcer of other part of left lower leg with fat layer exposed  E11.621 Type 2 diabetes mellitus with foot ulcer L97.522 Non-pressure chronic ulcer of other part of left foot with fat layer exposed Brott, Caellum A ([id number]) [id number] Page 8 of 8 Quantity: 1 Electronic Signature(s) Signed: 05/09/2023 9:20:08 AM By: [name] PA-C Previous Signature: 05/09/2023 8:46:49 AM Version By: Yevonne Pax RN Entered By: [name] on 05/09/2023 09:20:07  DEVVON, ARNWINE A ([id number]) [id number] Page 1 of 8 Visit Report for 05/09/2023 Chief Complaint Document Details Patient Name: Date of Service: COLBI, MULRY RLES A. 05/09/2023 8:00 A M Medical Record Number: [id number] Patient Account Number: [id number] Date of Birth/Sex: Treating RN: 12-Aug-1971 (52 y.o. [name]) Yevonne Pax Primary Care Provider: [name] Other Clinician: Referring Provider: Treating Provider/Extender: [name] in Treatment: 3 Information Obtained from: Patient Chief Complaint Left leg and left foot ulcers Electronic Signature(s) Signed: 05/09/2023 8:48:04 AM By: [name] PA-C Entered By: [name] on 05/09/2023 08:48:04 -------------------------------------------------------------------------------- HPI Details Patient Name: Date of Service: [name] 05/09/2023 8:00 A M Medical Record Number: [id number] Patient Account Number: [id number] Date of Birth/Sex: Treating RN: 01-Jul-1971 (52 y.o. [name] Primary Care Provider: [name] Other Clinician: Referring Provider: Treating Provider/Extender: [name] in Treatment: 3 History of Present Illness Chronic/Inactive Conditions Condition 1: 04-18-2023 patient screening ABI today was 0.83 and this was on the left. With that being said he had previous ABIs done formally on 06-25-2021 with a right ABI of 1.12 and a TBI of 1.03 on the left she had an ABI of 1.12 with a TBI of 0.98. HPI Description: 04-18-2023 upon evaluation today patient presents for initial inspection here in our clinic some concerning issues he is having with his left proximal foot as well as his left lower extremity. This actually appears to be more of a lymphedema type situation to be honest. He does have chronic lower extremity swelling secondary to what appears to be venous stasis he has not really had any wounds significantly before and tells me that it  has been a number of years probably even 12 or so since he has last used his lymphedema pumps. He is not even sure if they are actually functioning anymore or not. With that being said this is 1 thing that I did want him to look into. He is on a fluid pill and he does not wear compression socks on a regular basis although I definitely think that something that he also really needs to be doing to be honest. He does not have any injury that occurred to cause these wounds they more or less spontaneously occurred as a result of what appears to be excessive swelling. Patient does have a history of diabetes mellitus type 2, congestive heart failure, lymphedema, venous hypertension, hypertension, peripheral vascular disease, and chronic kidney disease stage III. 8/26; this is a patient with chronic lymphedema. He has wounds on the left medial lower extremity and left medial foot. We have been using Hydrofera Blue and Urgo K2. He tells me he has a compression pump at home although it is missing apart and he is not really familiar with with which company to call. He also started on furosemide 20 mg every other day probably would do better with every day and he is talking to his physician about this. NISHAN, SCHMIT A ([id number]) [id number] Page 2 of 8 05-05-2023 upon evaluation today patient appears to be doing pretty well currently in regard to his wound. Fortunately there does not appear to be any signs of infection he does have some slough and biofilm noted I did discuss with the patient today I do believe that he would benefit from a continuation of therapy with regard to his wound. I think we may need to increase the compression however and also think that he may benefit from targeted debridement today. 05-09-2023 upon evaluation today patient

## 2023-05-13 NOTE — Progress Notes (Signed)
PM By: Yevonne Pax RN Entered By: Yevonne Pax on 05/09/2023  08:09:32 -------------------------------------------------------------------------------- Patient/Caregiver Education Details Patient Name: Date of Service: Ryan Jaeger [name]. 9/9/2024andnbsp8:00 [name] Medical Record Number: [id number] Patient Account Number: [id number] Date of Birth/Gender: Treating RN: [birth year] (52 y.o. Ryan [name] Physician: Ryan [name] Other Clinician: Referring Physician: Treating Physician/Extender: Ryan [name] in Treatment: 3 [name], [name] ([id number]) [id number] Page 8 of 11 Education Assessment Education Provided To: Patient Education Topics Provided Wound/Skin Impairment: Handouts: Caring for Your Ulcer Methods: Explain/Verbal Responses: State content correctly Electronic Signature(s) Signed: 05/13/2023 1:15:19 PM By: Yevonne Pax RN Entered By: Yevonne Pax on 05/09/2023 08:18:05 -------------------------------------------------------------------------------- Wound Assessment Details Patient Name: Date of Service: Ryan [name] RLES [name]. 05/09/2023 8:00 [name] Medical Record Number: [id number] Patient Account Number: [id number] Date of Birth/Sex: Treating RN: Apr 18, 1971 (52 y.o. Ryan [name]) Yevonne Pax Primary [name] Ryan [name] Other Clinician: Referring Ryan [name]: Treating Ryan [name]/Extender: Ryan [name] in Treatment: 3 Wound Status Wound Number: 1 Primary Diabetic Wound/Ulcer of the Lower Extremity Etiology: Wound Location: Left, Medial Lower Leg Wound Open Wounding Event: Gradually Appeared Status: Date Acquired: 02/28/2023 Comorbid Arrhythmia, Congestive Heart Failure, Hypertension, Peripheral Weeks Of Treatment: 3 History: Venous Disease, Type II Diabetes Clustered Wound: No Photos Wound Measurements Length: (cm) 2.3 Width: (cm) 1.7 Depth: (cm) 0.2 Area: (cm) 3.071 Volume: (cm) 0.614 % Reduction in Area: 21.8% % Reduction in Volume:  47.9% Epithelialization: None Tunneling: No Undermining: No Wound Description Classification: Grade 2 Exudate Amount: Medium Ryan [name] ([id number]) Exudate Type: Serosanguineous Exudate Color: red, brown Foul Odor After Cleansing: No Slough/Fibrino Yes [id number] Page 9 of 11 Wound Bed Granulation Amount: Large (67-100%) Exposed Structure Granulation Quality: Pink Fascia Exposed: No Necrotic Amount: Small (1-33%) Fat Layer (Subcutaneous Tissue) Exposed: Yes Necrotic Quality: Adherent Slough Tendon Exposed: No Muscle Exposed: No Joint Exposed: No Bone Exposed: No Treatment Notes Wound #1 (Lower Leg) Wound Laterality: Left, Medial Cleanser Soap and Water Discharge Instruction: Gently cleanse wound with antibacterial soap, rinse and pat dry prior to dressing wounds Wound Cleanser Discharge Instruction: Wash your hands with soap and water. Remove old dressing, discard into plastic bag and place into trash. Cleanse the wound with Wound Cleanser prior to applying [name] clean dressing using gauze sponges, not tissues or cotton balls. Do not scrub or use excessive force. Pat dry using gauze sponges, not tissue or cotton balls. Peri-Wound [name] Topical Primary Dressing Hydrofera Blue Ready Transfer Foam, 2.5x2.5 (in/in) Discharge Instruction: Apply Hydrofera Blue Ready to wound bed as directed Secondary Dressing ABD Pad 5x9 (in/in) Discharge Instruction: Cover with ABD pad Secured With Compression Wrap Urgo K2, two layer compression system, large Compression Stockings Add-Ons Electronic Signature(s) Signed: 05/13/2023 1:15:19 PM By: Yevonne Pax RN Entered By: Yevonne Pax on 05/09/2023 08:15:54 -------------------------------------------------------------------------------- Wound Assessment Details Patient Name: Date of Service: Ryan [name] RLES [name]. 05/09/2023 8:00 [name] Medical Record Number: [id number] Patient Account Number: [id number] Date of  Birth/Sex: Treating RN: [birth year] (52 y.o. Ryan [name] Tamecia Mcdougald: Ryan [name] Other Clinician: Referring Ryan [name]: Treating Ryan [name]/Extender: Ryan [name] in Treatment: 3 Wound Status Wound Number: 2 Primary Diabetic Wound/Ulcer of the Lower Extremity Etiology: Wound Location: Left, Medial Foot Wound Open Wounding Event: Gradually Appeared StatusKING, Ryan [name] ([id number]) [id number] Page 10 of 11 Status: Date Acquired: 02/28/2023 Comorbid Arrhythmia, Congestive Heart Failure, Hypertension, Peripheral Weeks Of Treatment: 3 History: Venous Disease, Type II Diabetes Clustered Wound: No Photos Wound Measurements Length: (cm) 1  PM By: Yevonne Pax RN Entered By: Yevonne Pax on 05/09/2023  08:09:32 -------------------------------------------------------------------------------- Patient/Caregiver Education Details Patient Name: Date of Service: Ryan Jaeger [name]. 9/9/2024andnbsp8:00 [name] Medical Record Number: [id number] Patient Account Number: [id number] Date of Birth/Gender: Treating RN: [birth year] (52 y.o. Ryan [name] Physician: Ryan [name] Other Clinician: Referring Physician: Treating Physician/Extender: Ryan [name] in Treatment: 3 [name], [name] ([id number]) [id number] Page 8 of 11 Education Assessment Education Provided To: Patient Education Topics Provided Wound/Skin Impairment: Handouts: Caring for Your Ulcer Methods: Explain/Verbal Responses: State content correctly Electronic Signature(s) Signed: 05/13/2023 1:15:19 PM By: Yevonne Pax RN Entered By: Yevonne Pax on 05/09/2023 08:18:05 -------------------------------------------------------------------------------- Wound Assessment Details Patient Name: Date of Service: Ryan [name] RLES [name]. 05/09/2023 8:00 [name] Medical Record Number: [id number] Patient Account Number: [id number] Date of Birth/Sex: Treating RN: Apr 18, 1971 (52 y.o. Ryan [name]) Yevonne Pax Primary [name] Ryan [name] Other Clinician: Referring Ryan [name]: Treating Ryan [name]/Extender: Ryan [name] in Treatment: 3 Wound Status Wound Number: 1 Primary Diabetic Wound/Ulcer of the Lower Extremity Etiology: Wound Location: Left, Medial Lower Leg Wound Open Wounding Event: Gradually Appeared Status: Date Acquired: 02/28/2023 Comorbid Arrhythmia, Congestive Heart Failure, Hypertension, Peripheral Weeks Of Treatment: 3 History: Venous Disease, Type II Diabetes Clustered Wound: No Photos Wound Measurements Length: (cm) 2.3 Width: (cm) 1.7 Depth: (cm) 0.2 Area: (cm) 3.071 Volume: (cm) 0.614 % Reduction in Area: 21.8% % Reduction in Volume:  47.9% Epithelialization: None Tunneling: No Undermining: No Wound Description Classification: Grade 2 Exudate Amount: Medium Ryan [name] ([id number]) Exudate Type: Serosanguineous Exudate Color: red, brown Foul Odor After Cleansing: No Slough/Fibrino Yes [id number] Page 9 of 11 Wound Bed Granulation Amount: Large (67-100%) Exposed Structure Granulation Quality: Pink Fascia Exposed: No Necrotic Amount: Small (1-33%) Fat Layer (Subcutaneous Tissue) Exposed: Yes Necrotic Quality: Adherent Slough Tendon Exposed: No Muscle Exposed: No Joint Exposed: No Bone Exposed: No Treatment Notes Wound #1 (Lower Leg) Wound Laterality: Left, Medial Cleanser Soap and Water Discharge Instruction: Gently cleanse wound with antibacterial soap, rinse and pat dry prior to dressing wounds Wound Cleanser Discharge Instruction: Wash your hands with soap and water. Remove old dressing, discard into plastic bag and place into trash. Cleanse the wound with Wound Cleanser prior to applying [name] clean dressing using gauze sponges, not tissues or cotton balls. Do not scrub or use excessive force. Pat dry using gauze sponges, not tissue or cotton balls. Peri-Wound [name] Topical Primary Dressing Hydrofera Blue Ready Transfer Foam, 2.5x2.5 (in/in) Discharge Instruction: Apply Hydrofera Blue Ready to wound bed as directed Secondary Dressing ABD Pad 5x9 (in/in) Discharge Instruction: Cover with ABD pad Secured With Compression Wrap Urgo K2, two layer compression system, large Compression Stockings Add-Ons Electronic Signature(s) Signed: 05/13/2023 1:15:19 PM By: Yevonne Pax RN Entered By: Yevonne Pax on 05/09/2023 08:15:54 -------------------------------------------------------------------------------- Wound Assessment Details Patient Name: Date of Service: Ryan [name] RLES [name]. 05/09/2023 8:00 [name] Medical Record Number: [id number] Patient Account Number: [id number] Date of  Birth/Sex: Treating RN: [birth year] (52 y.o. Ryan [name] Tamecia Mcdougald: Ryan [name] Other Clinician: Referring Ryan [name]: Treating Ryan [name]/Extender: Ryan [name] in Treatment: 3 Wound Status Wound Number: 2 Primary Diabetic Wound/Ulcer of the Lower Extremity Etiology: Wound Location: Left, Medial Foot Wound Open Wounding Event: Gradually Appeared StatusKING, Ryan [name] ([id number]) [id number] Page 10 of 11 Status: Date Acquired: 02/28/2023 Comorbid Arrhythmia, Congestive Heart Failure, Hypertension, Peripheral Weeks Of Treatment: 3 History: Venous Disease, Type II Diabetes Clustered Wound: No Photos Wound Measurements Length: (cm) 1  PM By: Yevonne Pax RN Entered By: Yevonne Pax on 05/09/2023  08:09:32 -------------------------------------------------------------------------------- Patient/Caregiver Education Details Patient Name: Date of Service: Ryan Jaeger [name]. 9/9/2024andnbsp8:00 [name] Medical Record Number: [id number] Patient Account Number: [id number] Date of Birth/Gender: Treating RN: [birth year] (52 y.o. Ryan [name] Physician: Ryan [name] Other Clinician: Referring Physician: Treating Physician/Extender: Ryan [name] in Treatment: 3 [name], [name] ([id number]) [id number] Page 8 of 11 Education Assessment Education Provided To: Patient Education Topics Provided Wound/Skin Impairment: Handouts: Caring for Your Ulcer Methods: Explain/Verbal Responses: State content correctly Electronic Signature(s) Signed: 05/13/2023 1:15:19 PM By: Yevonne Pax RN Entered By: Yevonne Pax on 05/09/2023 08:18:05 -------------------------------------------------------------------------------- Wound Assessment Details Patient Name: Date of Service: Ryan [name] RLES [name]. 05/09/2023 8:00 [name] Medical Record Number: [id number] Patient Account Number: [id number] Date of Birth/Sex: Treating RN: Apr 18, 1971 (52 y.o. Ryan [name]) Yevonne Pax Primary [name] Ryan [name] Other Clinician: Referring Ryan [name]: Treating Ryan [name]/Extender: Ryan [name] in Treatment: 3 Wound Status Wound Number: 1 Primary Diabetic Wound/Ulcer of the Lower Extremity Etiology: Wound Location: Left, Medial Lower Leg Wound Open Wounding Event: Gradually Appeared Status: Date Acquired: 02/28/2023 Comorbid Arrhythmia, Congestive Heart Failure, Hypertension, Peripheral Weeks Of Treatment: 3 History: Venous Disease, Type II Diabetes Clustered Wound: No Photos Wound Measurements Length: (cm) 2.3 Width: (cm) 1.7 Depth: (cm) 0.2 Area: (cm) 3.071 Volume: (cm) 0.614 % Reduction in Area: 21.8% % Reduction in Volume:  47.9% Epithelialization: None Tunneling: No Undermining: No Wound Description Classification: Grade 2 Exudate Amount: Medium Ryan [name] ([id number]) Exudate Type: Serosanguineous Exudate Color: red, brown Foul Odor After Cleansing: No Slough/Fibrino Yes [id number] Page 9 of 11 Wound Bed Granulation Amount: Large (67-100%) Exposed Structure Granulation Quality: Pink Fascia Exposed: No Necrotic Amount: Small (1-33%) Fat Layer (Subcutaneous Tissue) Exposed: Yes Necrotic Quality: Adherent Slough Tendon Exposed: No Muscle Exposed: No Joint Exposed: No Bone Exposed: No Treatment Notes Wound #1 (Lower Leg) Wound Laterality: Left, Medial Cleanser Soap and Water Discharge Instruction: Gently cleanse wound with antibacterial soap, rinse and pat dry prior to dressing wounds Wound Cleanser Discharge Instruction: Wash your hands with soap and water. Remove old dressing, discard into plastic bag and place into trash. Cleanse the wound with Wound Cleanser prior to applying [name] clean dressing using gauze sponges, not tissues or cotton balls. Do not scrub or use excessive force. Pat dry using gauze sponges, not tissue or cotton balls. Peri-Wound [name] Topical Primary Dressing Hydrofera Blue Ready Transfer Foam, 2.5x2.5 (in/in) Discharge Instruction: Apply Hydrofera Blue Ready to wound bed as directed Secondary Dressing ABD Pad 5x9 (in/in) Discharge Instruction: Cover with ABD pad Secured With Compression Wrap Urgo K2, two layer compression system, large Compression Stockings Add-Ons Electronic Signature(s) Signed: 05/13/2023 1:15:19 PM By: Yevonne Pax RN Entered By: Yevonne Pax on 05/09/2023 08:15:54 -------------------------------------------------------------------------------- Wound Assessment Details Patient Name: Date of Service: Ryan [name] RLES [name]. 05/09/2023 8:00 [name] Medical Record Number: [id number] Patient Account Number: [id number] Date of  Birth/Sex: Treating RN: [birth year] (52 y.o. Ryan [name] Tamecia Mcdougald: Ryan [name] Other Clinician: Referring Ryan [name]: Treating Ryan [name]/Extender: Ryan [name] in Treatment: 3 Wound Status Wound Number: 2 Primary Diabetic Wound/Ulcer of the Lower Extremity Etiology: Wound Location: Left, Medial Foot Wound Open Wounding Event: Gradually Appeared StatusKING, Ryan [name] ([id number]) [id number] Page 10 of 11 Status: Date Acquired: 02/28/2023 Comorbid Arrhythmia, Congestive Heart Failure, Hypertension, Peripheral Weeks Of Treatment: 3 History: Venous Disease, Type II Diabetes Clustered Wound: No Photos Wound Measurements Length: (cm) 1  check if billed separately Has the patient been seen at the hospital within the last three years: Yes Total Score: 0 Level Of [name]: ____ Electronic Signature(s) Signed: 05/13/2023 1:15:19 PM By: Yevonne Pax RN Entered By: Yevonne Pax on 05/09/2023 08:46:37 -------------------------------------------------------------------------------- Compression Therapy Details Patient Name: Date of Service: Ryan [name] RLES [name]. 05/09/2023 8:00 [name] Medical Record Number: [id number] Patient Account Number: [id number] Date of Birth/Sex: Treating RN: 07-11-1971 (52 y.o. Ryan [name] Admiral Marcucci: Ryan [name] Other Clinician: Referring Lillyahna Hemberger: Treating Dinna Severs/Extender: Luchiano, [place] [name] ([id number]) [id number] Page 3 of 11 Weeks in Treatment: 3 Compression Therapy Performed for Wound Assessment: Wound #1 Left,Medial Lower Leg Performed By: Clinician Yevonne Pax, RN Compression Type: Double Layer Post Procedure Diagnosis Same as Pre-procedure Electronic Signature(s) Signed: 05/09/2023 8:46:26 AM By: Yevonne Pax RN Entered By: Yevonne Pax on 05/09/2023 08:46:26 -------------------------------------------------------------------------------- Compression Therapy Details Patient Name: Date of Service: Ryan [name] RLES [name]. 05/09/2023 8:00 [name] Medical Record Number: [id number] Patient Account Number: [id number] Date of Birth/Sex: Treating RN: 1971-06-04 (52 y.o. Ryan [name] Calah Gershman: Ryan [name] Other Clinician: Referring Camara Rosander: Treating Aleera Gilcrease/Extender: Ryan [name] in Treatment: 3 Compression Therapy Performed for Wound Assessment: Wound #2 Left,Medial Foot Performed By: Clinician Yevonne Pax, RN Compression Type: Double  Layer Post Procedure Diagnosis Same as Pre-procedure Electronic Signature(s) Signed: 05/09/2023 8:46:26 AM By: Yevonne Pax RN Entered By: Yevonne Pax on 05/09/2023 08:46:26 -------------------------------------------------------------------------------- Encounter Discharge Information Details Patient Name: Date of Service: Ryan [name] RLES [name]. 05/09/2023 8:00 [name] Medical Record Number: [id number] Patient Account Number: [id number] Date of Birth/Sex: Treating RN: [birth year] (52 y.o. Ryan [name] Donnae Michels: Ryan [name] Other Clinician: Referring Chey Cho: Treating Kailin Leu/Extender: Ryan [name] in Treatment: 3 Encounter Discharge Information Items Discharge Condition: Stable Ambulatory Status: Ambulatory Discharge Destination: Home Transportation: [address] [place] [name] ([id number]) [id number] Page 4 of 11 Accompanied By: self Schedule Follow-up Appointment: Yes Clinical Summary of [name]: Electronic Signature(s) Signed: 05/09/2023 8:47:20 AM By: Yevonne Pax RN Entered By: Yevonne Pax on 05/09/2023 08:47:20 -------------------------------------------------------------------------------- Lower Extremity Assessment Details Patient Name: Date of Service: Ryan [name], Ryan RLES [name]. 05/09/2023 8:00 [name] Medical Record Number: [id number] Patient Account Number: [id number] Date of Birth/Sex: Treating RN: Nov 16, 1970 (52 y.o. Ryan [name]) Yevonne Pax Primary [name] Cyncere Sontag: Ryan [name] Other Clinician: Referring Deann Mclaine: Treating Annaliz Aven/Extender: Ryan [name] in Treatment: 3 Edema Assessment Assessed: Ryan [name]: No] [Right: No] [Left: Edema] [Right: :] Calf Left: Right: Point of Measurement: 35 cm From Medial Instep 49 cm Ankle Left: Right: Point of Measurement: 10 cm From Medial Instep 31 cm Knee To Floor Left: Right: From Medial Instep 42 cm Vascular Assessment Pulses: Dorsalis Pedis Palpable:  [Left:Yes] Extremity colors, hair growth, and conditions: Extremity Color: [Left:Hyperpigmented] Temperature of Extremity: [Left:Warm] Capillary Refill: [Left:< 3 seconds] Dependent Rubor: [Left:No] Blanched when Elevated: [Left:No Yes] Toe Nail Assessment Left: Right: Thick: Yes Discolored: Yes Deformed: Yes Improper Length and Hygiene: Yes Electronic Signature(s) Signed: 05/13/2023 1:15:19 PM By: Yevonne Pax RN Entered By: Yevonne Pax on 05/09/2023 08:19:03 Gervase, Bing Neighbors ([id number]) [id number] Page 5 of 11 -------------------------------------------------------------------------------- Multi Wound Chart Details Patient Name: Date of Service: Ryan [name], Ryan RLES [name]. 05/09/2023 8:00 [name] Medical Record Number: [id number] Patient Account Number: [id number] Date of Birth/Sex: Treating RN: 09/07/1970 (52 y.o. Ryan [name] Imari Reen: Ryan [name] Other Clinician: Referring Melony Tenpas: Treating Celeste Candelas/Extender: Ryan [name] in Treatment: 3 Vital Signs Height(in): 67 Pulse(bpm): 96 Weight(lbs):  check if billed separately Has the patient been seen at the hospital within the last three years: Yes Total Score: 0 Level Of [name]: ____ Electronic Signature(s) Signed: 05/13/2023 1:15:19 PM By: Yevonne Pax RN Entered By: Yevonne Pax on 05/09/2023 08:46:37 -------------------------------------------------------------------------------- Compression Therapy Details Patient Name: Date of Service: Ryan [name] RLES [name]. 05/09/2023 8:00 [name] Medical Record Number: [id number] Patient Account Number: [id number] Date of Birth/Sex: Treating RN: 07-11-1971 (52 y.o. Ryan [name] Admiral Marcucci: Ryan [name] Other Clinician: Referring Lillyahna Hemberger: Treating Dinna Severs/Extender: Luchiano, [place] [name] ([id number]) [id number] Page 3 of 11 Weeks in Treatment: 3 Compression Therapy Performed for Wound Assessment: Wound #1 Left,Medial Lower Leg Performed By: Clinician Yevonne Pax, RN Compression Type: Double Layer Post Procedure Diagnosis Same as Pre-procedure Electronic Signature(s) Signed: 05/09/2023 8:46:26 AM By: Yevonne Pax RN Entered By: Yevonne Pax on 05/09/2023 08:46:26 -------------------------------------------------------------------------------- Compression Therapy Details Patient Name: Date of Service: Ryan [name] RLES [name]. 05/09/2023 8:00 [name] Medical Record Number: [id number] Patient Account Number: [id number] Date of Birth/Sex: Treating RN: 1971-06-04 (52 y.o. Ryan [name] Calah Gershman: Ryan [name] Other Clinician: Referring Camara Rosander: Treating Aleera Gilcrease/Extender: Ryan [name] in Treatment: 3 Compression Therapy Performed for Wound Assessment: Wound #2 Left,Medial Foot Performed By: Clinician Yevonne Pax, RN Compression Type: Double  Layer Post Procedure Diagnosis Same as Pre-procedure Electronic Signature(s) Signed: 05/09/2023 8:46:26 AM By: Yevonne Pax RN Entered By: Yevonne Pax on 05/09/2023 08:46:26 -------------------------------------------------------------------------------- Encounter Discharge Information Details Patient Name: Date of Service: Ryan [name] RLES [name]. 05/09/2023 8:00 [name] Medical Record Number: [id number] Patient Account Number: [id number] Date of Birth/Sex: Treating RN: [birth year] (52 y.o. Ryan [name] Donnae Michels: Ryan [name] Other Clinician: Referring Chey Cho: Treating Kailin Leu/Extender: Ryan [name] in Treatment: 3 Encounter Discharge Information Items Discharge Condition: Stable Ambulatory Status: Ambulatory Discharge Destination: Home Transportation: [address] [place] [name] ([id number]) [id number] Page 4 of 11 Accompanied By: self Schedule Follow-up Appointment: Yes Clinical Summary of [name]: Electronic Signature(s) Signed: 05/09/2023 8:47:20 AM By: Yevonne Pax RN Entered By: Yevonne Pax on 05/09/2023 08:47:20 -------------------------------------------------------------------------------- Lower Extremity Assessment Details Patient Name: Date of Service: Ryan [name], Ryan RLES [name]. 05/09/2023 8:00 [name] Medical Record Number: [id number] Patient Account Number: [id number] Date of Birth/Sex: Treating RN: Nov 16, 1970 (52 y.o. Ryan [name]) Yevonne Pax Primary [name] Cyncere Sontag: Ryan [name] Other Clinician: Referring Deann Mclaine: Treating Annaliz Aven/Extender: Ryan [name] in Treatment: 3 Edema Assessment Assessed: Ryan [name]: No] [Right: No] [Left: Edema] [Right: :] Calf Left: Right: Point of Measurement: 35 cm From Medial Instep 49 cm Ankle Left: Right: Point of Measurement: 10 cm From Medial Instep 31 cm Knee To Floor Left: Right: From Medial Instep 42 cm Vascular Assessment Pulses: Dorsalis Pedis Palpable:  [Left:Yes] Extremity colors, hair growth, and conditions: Extremity Color: [Left:Hyperpigmented] Temperature of Extremity: [Left:Warm] Capillary Refill: [Left:< 3 seconds] Dependent Rubor: [Left:No] Blanched when Elevated: [Left:No Yes] Toe Nail Assessment Left: Right: Thick: Yes Discolored: Yes Deformed: Yes Improper Length and Hygiene: Yes Electronic Signature(s) Signed: 05/13/2023 1:15:19 PM By: Yevonne Pax RN Entered By: Yevonne Pax on 05/09/2023 08:19:03 Gervase, Bing Neighbors ([id number]) [id number] Page 5 of 11 -------------------------------------------------------------------------------- Multi Wound Chart Details Patient Name: Date of Service: Ryan [name], Ryan RLES [name]. 05/09/2023 8:00 [name] Medical Record Number: [id number] Patient Account Number: [id number] Date of Birth/Sex: Treating RN: 09/07/1970 (52 y.o. Ryan [name] Imari Reen: Ryan [name] Other Clinician: Referring Melony Tenpas: Treating Celeste Candelas/Extender: Ryan [name] in Treatment: 3 Vital Signs Height(in): 67 Pulse(bpm): 96 Weight(lbs):

## 2023-05-16 ENCOUNTER — Encounter: Payer: No Typology Code available for payment source | Admitting: Physician Assistant

## 2023-05-16 ENCOUNTER — Emergency Department: Payer: No Typology Code available for payment source

## 2023-05-16 ENCOUNTER — Inpatient Hospital Stay
Admission: EM | Admit: 2023-05-16 | Discharge: 2023-05-25 | DRG: 164 | Disposition: A | Discharge status: Home / Self Care | Payer: No Typology Code available for payment source | Source: Ambulatory Visit | Attending: Internal Medicine | Admitting: Internal Medicine

## 2023-05-16 ENCOUNTER — Other Ambulatory Visit: Payer: Self-pay

## 2023-05-16 DIAGNOSIS — E1169 Type 2 diabetes mellitus with other specified complication: Secondary | ICD-10-CM | POA: Diagnosis present

## 2023-05-16 DIAGNOSIS — K760 Fatty (change of) liver, not elsewhere classified: Secondary | ICD-10-CM | POA: Diagnosis present

## 2023-05-16 DIAGNOSIS — T45516A Underdosing of anticoagulants, initial encounter: Secondary | ICD-10-CM | POA: Diagnosis present

## 2023-05-16 DIAGNOSIS — R6520 Severe sepsis without septic shock: Secondary | ICD-10-CM | POA: Diagnosis not present

## 2023-05-16 DIAGNOSIS — Z9189 Other specified personal risk factors, not elsewhere classified: Secondary | ICD-10-CM

## 2023-05-16 DIAGNOSIS — L03119 Cellulitis of unspecified part of limb: Secondary | ICD-10-CM | POA: Diagnosis not present

## 2023-05-16 DIAGNOSIS — E871 Hypo-osmolality and hyponatremia: Secondary | ICD-10-CM | POA: Diagnosis present

## 2023-05-16 DIAGNOSIS — I4891 Unspecified atrial fibrillation: Secondary | ICD-10-CM | POA: Diagnosis present

## 2023-05-16 DIAGNOSIS — Z91199 Patient's noncompliance with other medical treatment and regimen due to unspecified reason: Secondary | ICD-10-CM | POA: Diagnosis not present

## 2023-05-16 DIAGNOSIS — I5032 Chronic diastolic (congestive) heart failure: Secondary | ICD-10-CM | POA: Diagnosis present

## 2023-05-16 DIAGNOSIS — Z8249 Family history of ischemic heart disease and other diseases of the circulatory system: Secondary | ICD-10-CM

## 2023-05-16 DIAGNOSIS — Z833 Family history of diabetes mellitus: Secondary | ICD-10-CM

## 2023-05-16 DIAGNOSIS — I2782 Chronic pulmonary embolism: Secondary | ICD-10-CM | POA: Diagnosis present

## 2023-05-16 DIAGNOSIS — L03116 Cellulitis of left lower limb: Secondary | ICD-10-CM | POA: Diagnosis not present

## 2023-05-16 DIAGNOSIS — I5033 Acute on chronic diastolic (congestive) heart failure: Secondary | ICD-10-CM | POA: Diagnosis not present

## 2023-05-16 DIAGNOSIS — N1831 Chronic kidney disease, stage 3a: Secondary | ICD-10-CM | POA: Diagnosis present

## 2023-05-16 DIAGNOSIS — Z7901 Long term (current) use of anticoagulants: Secondary | ICD-10-CM | POA: Diagnosis not present

## 2023-05-16 DIAGNOSIS — Z7985 Long-term (current) use of injectable non-insulin antidiabetic drugs: Secondary | ICD-10-CM | POA: Diagnosis not present

## 2023-05-16 DIAGNOSIS — R0902 Hypoxemia: Secondary | ICD-10-CM | POA: Diagnosis not present

## 2023-05-16 DIAGNOSIS — I890 Lymphedema, not elsewhere classified: Secondary | ICD-10-CM | POA: Diagnosis present

## 2023-05-16 DIAGNOSIS — Z803 Family history of malignant neoplasm of breast: Secondary | ICD-10-CM

## 2023-05-16 DIAGNOSIS — I2609 Other pulmonary embolism with acute cor pulmonale: Secondary | ICD-10-CM | POA: Diagnosis not present

## 2023-05-16 DIAGNOSIS — G4733 Obstructive sleep apnea (adult) (pediatric): Secondary | ICD-10-CM | POA: Diagnosis present

## 2023-05-16 DIAGNOSIS — Z7984 Long term (current) use of oral hypoglycemic drugs: Secondary | ICD-10-CM

## 2023-05-16 DIAGNOSIS — R5381 Other malaise: Secondary | ICD-10-CM | POA: Diagnosis present

## 2023-05-16 DIAGNOSIS — I2699 Other pulmonary embolism without acute cor pulmonale: Secondary | ICD-10-CM | POA: Diagnosis present

## 2023-05-16 DIAGNOSIS — A419 Sepsis, unspecified organism: Secondary | ICD-10-CM | POA: Diagnosis not present

## 2023-05-16 DIAGNOSIS — N183 Chronic kidney disease, stage 3 unspecified: Secondary | ICD-10-CM | POA: Diagnosis present

## 2023-05-16 DIAGNOSIS — Z86718 Personal history of other venous thrombosis and embolism: Secondary | ICD-10-CM

## 2023-05-16 DIAGNOSIS — E785 Hyperlipidemia, unspecified: Secondary | ICD-10-CM | POA: Diagnosis present

## 2023-05-16 DIAGNOSIS — N179 Acute kidney failure, unspecified: Secondary | ICD-10-CM | POA: Diagnosis present

## 2023-05-16 DIAGNOSIS — J9601 Acute respiratory failure with hypoxia: Secondary | ICD-10-CM | POA: Diagnosis present

## 2023-05-16 DIAGNOSIS — Z9981 Dependence on supplemental oxygen: Secondary | ICD-10-CM

## 2023-05-16 DIAGNOSIS — I10 Essential (primary) hypertension: Secondary | ICD-10-CM | POA: Diagnosis present

## 2023-05-16 DIAGNOSIS — E1151 Type 2 diabetes mellitus with diabetic peripheral angiopathy without gangrene: Secondary | ICD-10-CM | POA: Diagnosis present

## 2023-05-16 DIAGNOSIS — Z91138 Patient's unintentional underdosing of medication regimen for other reason: Secondary | ICD-10-CM

## 2023-05-16 DIAGNOSIS — E1122 Type 2 diabetes mellitus with diabetic chronic kidney disease: Secondary | ICD-10-CM | POA: Diagnosis present

## 2023-05-16 DIAGNOSIS — I50810 Right heart failure, unspecified: Secondary | ICD-10-CM | POA: Diagnosis not present

## 2023-05-16 DIAGNOSIS — J9621 Acute and chronic respiratory failure with hypoxia: Secondary | ICD-10-CM | POA: Diagnosis present

## 2023-05-16 DIAGNOSIS — Z8042 Family history of malignant neoplasm of prostate: Secondary | ICD-10-CM

## 2023-05-16 DIAGNOSIS — J9611 Chronic respiratory failure with hypoxia: Secondary | ICD-10-CM | POA: Diagnosis present

## 2023-05-16 DIAGNOSIS — Z91041 Radiographic dye allergy status: Secondary | ICD-10-CM

## 2023-05-16 DIAGNOSIS — R791 Abnormal coagulation profile: Secondary | ICD-10-CM | POA: Diagnosis not present

## 2023-05-16 DIAGNOSIS — Z79899 Other long term (current) drug therapy: Secondary | ICD-10-CM

## 2023-05-16 DIAGNOSIS — L97921 Non-pressure chronic ulcer of unspecified part of left lower leg limited to breakdown of skin: Secondary | ICD-10-CM | POA: Diagnosis present

## 2023-05-16 DIAGNOSIS — R591 Generalized enlarged lymph nodes: Secondary | ICD-10-CM | POA: Diagnosis present

## 2023-05-16 DIAGNOSIS — I130 Hypertensive heart and chronic kidney disease with heart failure and stage 1 through stage 4 chronic kidney disease, or unspecified chronic kidney disease: Secondary | ICD-10-CM | POA: Diagnosis present

## 2023-05-16 DIAGNOSIS — Z6841 Body Mass Index (BMI) 40.0 and over, adult: Secondary | ICD-10-CM

## 2023-05-16 DIAGNOSIS — Z86711 Personal history of pulmonary embolism: Secondary | ICD-10-CM | POA: Diagnosis present

## 2023-05-16 DIAGNOSIS — I82509 Chronic embolism and thrombosis of unspecified deep veins of unspecified lower extremity: Secondary | ICD-10-CM | POA: Diagnosis present

## 2023-05-16 DIAGNOSIS — Z882 Allergy status to sulfonamides status: Secondary | ICD-10-CM

## 2023-05-16 DIAGNOSIS — Z5181 Encounter for therapeutic drug level monitoring: Secondary | ICD-10-CM

## 2023-05-16 DIAGNOSIS — I2781 Cor pulmonale (chronic): Secondary | ICD-10-CM | POA: Insufficient documentation

## 2023-05-16 DIAGNOSIS — E11628 Type 2 diabetes mellitus with other skin complications: Secondary | ICD-10-CM | POA: Diagnosis not present

## 2023-05-16 DIAGNOSIS — Z881 Allergy status to other antibiotic agents status: Secondary | ICD-10-CM

## 2023-05-16 LAB — CBC
HCT: 49.7 % (ref 39.0–52.0)
Hemoglobin: 16.8 g/dL (ref 13.0–17.0)
MCH: 30.2 pg (ref 26.0–34.0)
MCHC: 33.8 g/dL (ref 30.0–36.0)
MCV: 89.4 fL (ref 80.0–100.0)
Platelets: 163 10*3/uL (ref 150–400)
RBC: 5.56 MIL/uL (ref 4.22–5.81)
RDW: 15.9 % — ABNORMAL HIGH (ref 11.5–15.5)
WBC: 4.4 10*3/uL (ref 4.0–10.5)
nRBC: 0 % (ref 0.0–0.2)

## 2023-05-16 LAB — BASIC METABOLIC PANEL
Anion gap: 11 (ref 5–15)
BUN: 19 mg/dL (ref 6–20)
CO2: 20 mmol/L — ABNORMAL LOW (ref 22–32)
Calcium: 8.5 mg/dL — ABNORMAL LOW (ref 8.9–10.3)
Chloride: 101 mmol/L (ref 98–111)
Creatinine, Ser: 1.77 mg/dL — ABNORMAL HIGH (ref 0.61–1.24)
GFR, Estimated: 45 mL/min — ABNORMAL LOW (ref >60)
Glucose, Bld: 113 mg/dL — ABNORMAL HIGH (ref 70–99)
Potassium: 3.8 mmol/L (ref 3.5–5.1)
Sodium: 132 mmol/L — ABNORMAL LOW (ref 135–145)

## 2023-05-16 LAB — PROTIME-INR
INR: 1.2 (ref 0.8–1.2)
Prothrombin Time: 15.6 s — ABNORMAL HIGH (ref 11.4–15.2)

## 2023-05-16 LAB — HEPARIN LEVEL (UNFRACTIONATED): Heparin Unfractionated: 0.5 [IU]/mL (ref 0.30–0.70)

## 2023-05-16 LAB — APTT: aPTT: 29 s (ref 24–36)

## 2023-05-16 MED ORDER — METOPROLOL SUCCINATE ER 25 MG PO TB24
25.0000 mg | ORAL_TABLET | Freq: Every day | ORAL | Status: DC
  Administered 2023-05-17 – 2023-05-25 (×9): 25 mg via ORAL
  Filled 2023-05-16 (×9): qty 1

## 2023-05-16 MED ORDER — SODIUM CHLORIDE 0.9 % IV SOLN: INTRAVENOUS | Status: DC

## 2023-05-16 MED ORDER — HEPARIN BOLUS VIA INFUSION
7000.0000 [IU] | Freq: Once | INTRAVENOUS | Status: AC
  Administered 2023-05-16: 7000 [IU] via INTRAVENOUS
  Filled 2023-05-16: qty 7000

## 2023-05-16 MED ORDER — SENNOSIDES-DOCUSATE SODIUM 8.6-50 MG PO TABS: 1.0000 | ORAL_TABLET | Freq: Every evening | ORAL | Status: DC | PRN

## 2023-05-16 MED ORDER — IOHEXOL 350 MG/ML SOLN
100.0000 mL | Freq: Once | INTRAVENOUS | Status: AC | PRN
  Administered 2023-05-16: 75 mL via INTRAVENOUS

## 2023-05-16 MED ORDER — ACETAMINOPHEN 650 MG RE SUPP: 650.0000 mg | Freq: Four times a day (QID) | RECTAL | Status: AC | PRN

## 2023-05-16 MED ORDER — ALBUTEROL SULFATE (2.5 MG/3ML) 0.083% IN NEBU: 2.5000 mg | INHALATION_SOLUTION | Freq: Four times a day (QID) | RESPIRATORY_TRACT | Status: DC | PRN

## 2023-05-16 MED ORDER — HEPARIN (PORCINE) 25000 UT/250ML-% IV SOLN
1700.0000 [IU]/h | INTRAVENOUS | Status: DC
  Administered 2023-05-16: 1700 [IU]/h via INTRAVENOUS
  Filled 2023-05-16 (×3): qty 250

## 2023-05-16 MED ORDER — ONDANSETRON HCL 4 MG/2ML IJ SOLN
4.0000 mg | Freq: Four times a day (QID) | INTRAMUSCULAR | Status: AC | PRN
  Administered 2023-05-16: 4 mg via INTRAVENOUS
  Filled 2023-05-16: qty 2

## 2023-05-16 MED ORDER — ACETAMINOPHEN 325 MG PO TABS: 650.0000 mg | ORAL_TABLET | Freq: Four times a day (QID) | ORAL | Status: AC | PRN

## 2023-05-16 MED ORDER — ONDANSETRON HCL 4 MG PO TABS: 4.0000 mg | ORAL_TABLET | Freq: Four times a day (QID) | ORAL | Status: AC | PRN

## 2023-05-16 NOTE — Consult Note (Signed)
Pharmacy Consult Note - Anticoagulation  Pharmacy Consult for heparin Indication: pulmonary embolus  PATIENT MEASUREMENTS: Height: 5\' 7"  (170.2 cm) Weight: (!) 147.4 kg (325 lb) IBW/kg (Calculated) : 66.1 HEPARIN DW (KG): 102.1  VITAL SIGNS: Temp: 98.7 F (37.1 C) (09/16 0905) Temp Source: Oral (09/16 0905) BP: 113/90 (09/16 1307) Pulse Rate: 75 (09/16 1307)  Recent Labs    05/16/23 0911 05/16/23 0958  HGB 16.8  --   HCT 49.7  --   PLT 163  --   APTT  --  29  LABPROT  --  15.6*  INR  --  1.2  CREATININE 1.77*  --     Estimated Creatinine Clearance: 67.3 mL/min (A) (by C-G formula based on SCr of 1.77 mg/dL (H)).  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  Regular the workup has not been forthcoming) previously followed by Dr. Isaiah Serge at [place]   Hepatic steatosis    Hypertension    Morbid obesity with BMI of 50.0-59.9, adult [facility])    Peripheral vascular disease (HCC)    Presence of IVC filter    Has 2 IVC filters with significant thrombus burden superior to the filter.   Pulmonary embolus (HCC) 12/2016   Initially just treated with anticoagulation, but in February 2023 in April 2024 treated with EKOS thrombectomy for submassive PE.   Ventricular trigeminy    Weakness of both lower limbs     ASSESSMENT: 52 y.o. male with PMH chronic PE s/p IVC filter x 2 in 2005 and 2012 (on warfarin, INR goal 2-3), diabetes, HTN is presenting with shortness of breath, attributed to pulmonary embolism. CT chest demonstrates large clot in right pulmonary artery with extension into RUL lobar pulmonary artery, essentially unchanged from prior study on  12/13/2022. Additionally, interval increase in peripheral nonocclusive thrombus in LLL lobar pulmonary artery. Imaging also shows compressed LV, consistent with right heart failure/strain. Patient is on chronic anticoagulation per chart review. Pharmacy has been consulted to initiate and manage heparin intravenous infusion.  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  Pertinent medications: PTA Warfarin 5 mg daily  Goal(s) of therapy: Heparin level 0.3 - 0.7 units/mL INR 2 - 3 Monitor platelets by anticoagulation protocol: Yes   Baseline anticoagulation labs: Recent Labs    05/16/23 0911 05/16/23 0958  APTT  --  29  INR  --  1.2  HGB 16.8  --   PLT 163  --    Warfarin Date INR Warfarin Dose 9/16 1.2 Held due to heparin drip  Heparin Date Time aPTT/HL Rate/Comment    PLAN: Hold warfarin today Give 7000 units bolus x1; then start heparin infusion at 1700 units/hour. Check heparin level in 6 hours, then daily once at least two levels are consecutively therapeutic. Monitor CBC daily while on heparin infusion.  Will M. Dareen Piano, PharmD Clinical Pharmacist 05/16/2023 1:24 PM

## 2023-05-16 NOTE — Assessment & Plan Note (Addendum)
This complicates overall care and prognosis.  Counseling for healthy weight loss at bedside

## 2023-05-16 NOTE — Assessment & Plan Note (Signed)
Home metoprolol succinate 25 mg daily resumed for 9/17 Home Entresto not resumed on admission due to acute kidney injury, a.m. team to resume when the benefits outweigh the risk

## 2023-05-16 NOTE — H&P (Addendum)
History and Physical   Ryan [name] QVZ:[id number] DOB: 01/22/1971 DOA: 05/16/2023  PCP: [name], NP  Outpatient Specialists: Dr. Baltazar Najjar, wound center Patient coming from: Wound care clinic via EMS  I have personally briefly reviewed patient's old medical records in [facility] EMR.  Chief Concern: Hypoxia  HPI: Mr. 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  Vitals in the ED showed temperature 98.7, respiration rate of 18, heart rate of 88, blood pressure 108/83, SpO2 of 93% on 2 L nasal cannula.  Serum sodium is 132, potassium 3.8, chloride 101, bicarb 20, BUN of 19, serum creatinine of 1.77, nonfasting blood glucose 113, WBC 4.4, hemoglobin 16.8, platelets of 163.  INR is 1.2, PT 15.6, PTT 29.  ED treatment: Heparin per pharmacy and EDP consulted vascular specialist for thrombectomy.  Per EDP, vascular surgery responded to EDP page and at the time of this dictation, patient is scheduled for pulmonary thrombectomy on 05/17/23. ----------------------------------- At bedside, patient is able to tell me his name, age, current year, current location.  He reports that shortness of breath with exertion since Friday evening. His last dose of Warfarin was Wednesday evening because he ran out of his medications and was waiting for administration/authorization and medication refilled.   Per patient, hematologist said his weight is too high to be on DOAC's including Xarelto.  Patient states that he is trying to get his weight down so that he can resume  Xarelto.  He endorses subjective fever and chills on Friday and thought it was an infection in his leg.  He states he presented to the wound clinic today and was told that the leg is likely not infected and that his chronic lymphedema and then he was sent to the emergency department for his shortness of breath and hypoxia evaluation.  He reports his appetite has declined over the last few weeks since he started [facility] for prediabetes and weight loss.  He reports decreased p.o. intake of water.  He endorses compliance with his home "water pill".  Family history: Paternal grandfather, paternal cousins, paternal aunt with multiple history of DVT and PE  Social history: He lives with his wife. He denies tobacco, EtOH, recreational drug use.  He is on chronic pain medication as prescribed.  He works for [employer].  ROS: Constitutional: no weight change, no fever ENT/Mouth: no sore throat, no rhinorrhea Eyes: no eye pain, no vision changes Cardiovascular: no chest pain, + dyspnea,  + bilateral lower leg edema, no palpitations Respiratory: no cough, no sputum, no wheezing Gastrointestinal: no nausea, no vomiting, no diarrhea, no constipation Genitourinary: no urinary incontinence, no dysuria, no hematuria Musculoskeletal: no arthralgias, no myalgias Skin: no skin lesions, no pruritus, Neuro: + weakness, no loss of consciousness, no syncope Psych: no anxiety, no depression, + decrease appetite Heme/Lymph: no bruising, no bleeding  ED Course: Discussed with emergency medicine provider, patient requiring hospitalization for chief concerns of acute on chronic PE and acute hypoxic respiratory failure.  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)   Essential  hypertension   CKD (chronic kidney disease), stage IIIa   Morbid obesity with BMI of 50.0-59.9, adult (HCC)   Hepatic steatosis   Hyperlipidemia associated with type 2 diabetes mellitus (HCC)   Physical deconditioning   At risk for sleep apnea   Lymphadenopathy   Anticoagulation goal of INR 2 to 3   Bilateral pulmonary embolism (HCC)   Cor pulmonale (chronic) (HCC)   Assessment and Plan:  * Pulmonary emboli (HCC) Highly suspicious for hypercoagulable state secondary to hereditary etiology as patient has multiple family history with DVT and PE especially paternal side and this is complicated by patient running out of his home warfarin Acute on chronic right main pulmonary artery and interval increase nonocclusive thrombus in the left lower lung lobe lobar pulmonary artery CT read as redemonstration of compressed left ventricle, compatible with right heart failure/strain Continue heparin per pharmacy EDP consulted vascular surgery N.p.o. after midnight in anticipation of possible vascular surgical intervention Admit to PCU, inpatient  AKI (acute kidney injury) (HCC) Baseline serum creatinine is sCr 1.41-1.48/eGFR 56-60 over the last 4 months Etiology workup in progress, differential diagnosis include prerenal/dehydration in setting of decreased p.o. intake including fluid from [facility] use with continued p.o. Lasix medication however in setting of acute on chronic PE, cardiorenal cannot be excluded at this time. Patient mucosa is dry clinically, sodium chloride infusion 125 mL/h, 1 day ordered BMP in the a.m. Strict I's and O's Complete echo ordered  Acute respiratory failure with hypoxia (HCC) Secondary to acute on chronic PE Continue oxygen support to maintain SpO2 greater than 92% Treat per above  Morbid obesity with BMI of 50.0-59.9, adult (HCC) This complicates overall care and prognosis.  Counseling for healthy weight loss at bedside  Essential hypertension Home  metoprolol succinate 25 mg daily resumed for 9/17 Home Entresto not resumed on admission due to acute kidney injury, a.m. team to resume when the benefits outweigh the risk  Cor pulmonale (chronic) (HCC) Secondary to chronic PE  Anticoagulation goal of INR 2 to 3 Warfarin not resumed on admission at this time Heparin per pharmacy for acute on chronic PE  At risk for sleep apnea CPAP nightly ordered  Chart reviewed.   Complete echo on 04/08/2022: Estimated ejection fraction 55 to 60%, left ventricular diastolic parameters were normal.  DVT prophylaxis: Heparin per pharmacy Code Status: Full code Diet: Clear liquids; n.p.o. after midnight in anticipation of vascular procedure Family Communication: No Disposition Plan: Pending clinical course Consults called: Vascular specialist via EDP Admission status: PCU, inpatient  Past Medical History:  Diagnosis Date   (HFpEF) heart failure with preserved ejection fraction (HCC)    Arrhythmia    atrial fibrillation   Diabetes mellitus type II, non insulin dependent (HCC)    Diabetes mellitus without complication (HCC)    per pt-pre diabetic-Dr Dario Guardian stated said pt   DVT (deep venous thrombosis) (HCC)    Over 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  Regular the workup has not been forthcoming) previously followed by [facility]. Isaiah Serge at [facility]   Hepatic steatosis    Hypertension    Morbid obesity with BMI of 50.0-59.9, adult [facility])    Peripheral vascular disease (HCC)    Presence of IVC filter    Has 2 IVC filters with significant thrombus burden superior to the filter.   Pulmonary embolus (HCC) 12/2016   Initially  just treated with anticoagulation, but in February 2023 in April 2024 treated with EKOS thrombectomy for submassive PE.   Ventricular trigeminy    Weakness of both lower limbs    Past Surgical History:  Procedure Laterality Date   CARDIAC ELECTROPHYSIOLOGY STUDY AND ABLATION  10/2020   PERIPHERAL VASCULAR CATHETERIZATION N/A 01/10/2015   Procedure:  Dialysis/Perma Catheter Insertion;  Surgeon: Renford Dills, MD;  Location: [facility] INVASIVE CV LAB;  Service: Cardiovascular;  Laterality: N/A;   PERIPHERAL VASCULAR CATHETERIZATION N/A 02/24/2015   Procedure: Dialysis/Perma Catheter Removal;  Surgeon: Annice Needy, MD;  Location: [facility] INVASIVE CV LAB;  Service: Cardiovascular;  Laterality: N/A;   PULMONARY THROMBECTOMY N/A 12/13/2022   Procedure: PULMONARY THROMBECTOMY;  Surgeon: Annice Needy, MD;  Location: [facility] INVASIVE CV LAB;  Service: Cardiovascular;  Laterality: N/A;   Social History:  reports that he has never smoked. He has never used smokeless tobacco. He reports that he does not drink alcohol and does not use drugs.  Allergies  Allergen Reactions   Metrizamide Other (See Comments)    Kidney failure requiring dialysis Kidney failure requiring dialysis    Clindamycin/Lincomycin Rash   Lincomycin Rash   Sulfa Antibiotics Rash   Family History  Problem Relation Age of Onset   Hypertension Mother    Cancer Mother    Breast cancer Mother    Prostate cancer Father    Hypertension Father    Diabetes Father    Heart disease Maternal Grandmother    Pulmonary embolism Paternal Grandfather    Pulmonary embolism Paternal Uncle    Pulmonary embolism Cousin    Deep vein thrombosis Cousin    Pulmonary embolism Paternal Aunt    Family history: Family history reviewed and not pertinent.  Prior to Admission medications   Medication Sig Start Date End Date Taking? Authorizing Provider  albuterol (VENTOLIN HFA) 108 (90 Base) MCG/ACT inhaler Inhale 2 puffs into the lungs every 6 (six) hours as needed for wheezing or shortness of breath. 02/23/22   Sunnie Nielsen, DO  furosemide (LASIX) 40 MG tablet Take 1 tablet (40 mg total) by mouth every other day. 04/21/23   Mecum, Erin E, PA-C  magnesium oxide (MAG-OX) 400 (240 Mg) MG tablet Take 400 mg by mouth daily.    [provider]  metoprolol succinate (TOPROL-XL) 25 MG 24 hr tablet  Take 1 tablet (25 mg total) by mouth daily. 02/23/22   Sunnie Nielsen, DO  OXYGEN Inhale 1 L into the lungs daily.    [provider]  sacubitril-valsartan (ENTRESTO) 24-26 MG Take 1 tablet by mouth 2 (two) times daily. 10/28/22   Delma Freeze, FNP  tirzepatide [facility]) 5 MG/0.5ML Pen Inject 5 mg into the skin once a week. 01/05/23   [name], NP  warfarin (COUMADIN) 5 MG tablet Take 1 tablet (5 mg total) by mouth daily. 12/23/22   [name], NP   Physical Exam: Vitals:   05/16/23 1307 05/16/23 1400 05/16/23 1500 05/16/23 1715  BP: (!) 113/90 115/86 (!) 135/104 117/86  Pulse: 75 78 73 70  Resp: 16 (!) 21 (!) 23 18  Temp:      TempSrc:      SpO2: 95% 98% 99% 94%  Weight:      Height:       Constitutional: appears older than chronological age, NAD, calm Eyes: PERRL, lids and conjunctivae normal ENMT: Mucous membranes are dry. Posterior pharynx clear of any exudate or lesions. Age-appropriate dentition. Hearing appropriate Neck: normal, supple,  no masses, no thyromegaly Respiratory: clear to auscultation bilaterally, no wheezing, no crackles. Normal respiratory effort. No accessory muscle use.  Cardiovascular: Regular rate and rhythm, no murmurs / rubs / gallops.  Bilateral lower extremity lymphadenopathy. 2+ pedal pulses. No carotid bruits.  Abdomen: Morbidly obese abdomen, no tenderness, no masses palpated, no hepatosplenomegaly. Bowel sounds positive.  Musculoskeletal: no clubbing / cyanosis. No joint deformity upper and lower extremities. Good ROM, no contractures, no atrophy. Normal muscle tone.  Compression dressing at the left lower extremity from wound clinic Skin: no rashes, lesions, ulcers. No induration Neurologic: Sensation intact. Strength 5/5 in all 4.  Psychiatric: Normal judgment and insight. Alert and oriented x 3. Normal mood.   EKG: independently reviewed, showing sinus rhythm with rate of 89, QTc 496  Chest x-ray on Admission: I  personally reviewed and I agree with radiologist reading as below.  CT Angio Chest PE W and/or Wo Contrast  Result Date: 05/16/2023 CLINICAL DATA:  Pulmonary embolism (PE) suspected, high prob. Shortness of breath. Fatigue. Chills. EXAM: CT ANGIOGRAPHY CHEST WITH CONTRAST TECHNIQUE: Multidetector CT imaging of the chest was performed using the standard protocol during bolus administration of intravenous contrast. Multiplanar CT image reconstructions and MIPs were obtained to evaluate the vascular anatomy. RADIATION DOSE REDUCTION: This exam was performed according to the departmental dose-optimization program which includes automated exposure control, adjustment of the mA and/or kV according to patient size and/or use of iterative reconstruction technique. CONTRAST:  75mL OMNIPAQUE IOHEXOL 350 MG/ML SOLN COMPARISON:  CT angiography chest from 12/13/2022. FINDINGS: Cardiovascular: Redemonstration of large clot in the main right pulmonary artery, essentially similar to the prior study. There is extension of the clot into the right upper lobe lobar pulmonary artery, that is also unchanged. However, there is interval increase in the peripheral nonocclusive thrombus in the left lung lower lobe lobar pulmonary artery when compared to the prior exam. It is unclear whether this is acute or subacute, on the basis of this exam. There are additional, stable, several linear web like filling defects in the bilateral lower lobe pulmonary artery branches, compatible with chronic emboli. Redemonstration of compressed left ventricle, compatible with right heart failure/strain. There is dilation of the main pulmonary trunk measuring up to 3.7 cm, which is nonspecific but can be seen with pulmonary artery hypertension. Normal cardiac size. No pericardial effusion. No aortic aneurysm. Mediastinum/Nodes: Visualized thyroid gland appears grossly unremarkable. No solid / cystic mediastinal masses. The esophagus is nondistended  precluding optimal assessment. No axillary, mediastinal or hilar lymphadenopathy by size criteria. Lungs/Pleura: The central tracheo-bronchial tree is patent. No mass or consolidation. No pleural effusion or pneumothorax. No suspicious lung nodules. Upper Abdomen: Visualized upper abdominal viscera within normal limits. Suprarenal IVC filter again noted. Musculoskeletal: Redemonstration of multiple dilated venous collaterals along the anterior chest wall. The visualized soft tissues of the chest wall are otherwise grossly unremarkable. No suspicious osseous lesions. There are mild multilevel degenerative changes in the visualized spine. Review of the MIP images confirms the above findings. IMPRESSION: 1. Redemonstration of large clot in the main right pulmonary artery, essentially similar to the prior study. There is extension of the clot into the right upper lobe lobar pulmonary artery, that is also unchanged. However, there is interval increase in the peripheral nonocclusive thrombus in the left lung lower lobe lobar pulmonary artery when compared to the prior exam. 2. Redemonstration of compressed left ventricle, compatible with right heart failure/strain. 3. Dilation of the main pulmonary trunk measuring up to 3.7 cm,  which is nonspecific but can be seen with pulmonary artery hypertension. 4. Multiple other nonacute observations, as described above. Electronically Signed   By: Jules Schick M.D.   On: 05/16/2023 12:18   DG Chest 2 View  Result Date: 05/16/2023 CLINICAL DATA:  Shortness of breath for several days. Chills. Fatigue. Hypoxia. EXAM: CHEST - 2 VIEW COMPARISON:  03/07/2023 FINDINGS: The heart size and mediastinal contours are within normal limits. Both lungs are clear. The visualized skeletal structures are unremarkable. IMPRESSION: No active cardiopulmonary disease. Electronically Signed   By: Danae Orleans M.D.   On: 05/16/2023 09:40    Labs on Admission: I have personally reviewed following  labs  CBC: Recent Labs  Lab 05/16/23 0911  WBC 4.4  HGB 16.8  HCT 49.7  MCV 89.4  PLT 163   Basic Metabolic Panel: Recent Labs  Lab 05/16/23 0911  NA 132*  K 3.8  CL 101  CO2 20*  GLUCOSE 113*  BUN 19  CREATININE 1.77*  CALCIUM 8.5*   GFR: Estimated Creatinine Clearance: 67.3 mL/min (A) (by C-G formula based on SCr of 1.77 mg/dL (H)).  Coagulation Profile: Recent Labs  Lab 05/16/23 0958  INR 1.2   Urine analysis:    Component Value Date/Time   COLORURINE YELLOW (A) 04/06/2022 1916   APPEARANCEUR CLEAR (A) 04/06/2022 1916   APPEARANCEUR Clear 03/18/2022 1436   LABSPEC 1.025 04/06/2022 1916   LABSPEC 1.012 12/25/2014 1655   PHURINE 5.0 04/06/2022 1916   GLUCOSEU >=500 (A) 04/06/2022 1916   GLUCOSEU Negative 12/25/2014 1655   HGBUR NEGATIVE 04/06/2022 1916   BILIRUBINUR NEGATIVE 04/06/2022 1916   BILIRUBINUR Negative 03/18/2022 1436   BILIRUBINUR Negative 12/25/2014 1655   KETONESUR NEGATIVE 04/06/2022 1916   PROTEINUR NEGATIVE 04/06/2022 1916   NITRITE NEGATIVE 04/06/2022 1916   LEUKOCYTESUR NEGATIVE 04/06/2022 1916   LEUKOCYTESUR Negative 12/25/2014 1655   This document was prepared using Dragon Voice Recognition software and may include unintentional dictation errors.  Dr. Sedalia Muta [facility]  If 7PM-7AM, please contact overnight-coverage provider If 7AM-7PM, please contact day attending provider [email or web address]  05/16/2023, 5:51 PM

## 2023-05-16 NOTE — Assessment & Plan Note (Addendum)
Baseline serum creatinine is sCr 1.41-1.48/eGFR 56-60 over the last 4 months Etiology workup in progress, differential diagnosis include prerenal/dehydration in setting of decreased p.o. intake including fluid from [facility] use with continued p.o. Lasix medication however in setting of acute on chronic PE, cardiorenal cannot be excluded at this time. Patient mucosa is dry clinically, sodium chloride infusion 125 mL/h, 1 day ordered BMP in the a.m. Strict I's and O's Complete echo ordered

## 2023-05-16 NOTE — Consult Note (Signed)
Hospital Consult    Reason for Consult:  Shortness of breath dur to Pulmonary Embolism.  Requesting Physician:  Dr Jene Every MD MRN #:  [id number]  History of Present Illness: This is a 52 y.o. male Ryan [name] with a history of PE, diabetes, hypertension, IVC filter on Coumadin presents with shortness of breath.  Was at wound care for follow-up found to be hypoxic there, patient typically uses oxygen 2 L at night as needed, is needing 2 L nasal cannula to keep saturations above 90% at this time. 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   Upon work up patient was found to have a pulmonary embolism.  Vascular Surgery consulted to evaluate.  Patient states that his Coumadin prescription ran out last Wednesday.  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  I do not believe that this is a Coumadin drug failure at this time.  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  Regular the workup has not been forthcoming) previously followed by Dr. Isaiah Serge at [facility]   Hepatic steatosis    Hypertension    Morbid obesity with BMI of 50.0-59.9, adult [facility])    Peripheral vascular disease (HCC)    Presence of IVC filter    Has 2 IVC filters with significant thrombus burden superior to the filter.   Pulmonary embolus (HCC) 12/2016   Initially just treated with anticoagulation, but in February 2023 in April 2024 treated with EKOS thrombectomy for submassive PE.   Ventricular trigeminy    Weakness of both lower limbs     Past Surgical History:   Procedure Laterality Date   CARDIAC ELECTROPHYSIOLOGY STUDY AND ABLATION  10/2020   PERIPHERAL VASCULAR CATHETERIZATION N/A 01/10/2015   Procedure: Dialysis/Perma Catheter Insertion;  Surgeon: Renford Dills, MD;  Location: [facility] INVASIVE CV LAB;  Service: Cardiovascular;  Laterality: N/A;   PERIPHERAL VASCULAR CATHETERIZATION N/A 02/24/2015   Procedure: Dialysis/Perma Catheter Removal;  Surgeon: Annice Needy, MD;  Location: [facility] INVASIVE CV LAB;  Service: Cardiovascular;  Laterality: N/A;   PULMONARY THROMBECTOMY N/A 12/13/2022   Procedure: PULMONARY THROMBECTOMY;  Surgeon: Annice Needy, MD;  Location: [facility] INVASIVE CV LAB;  Service: Cardiovascular;  Laterality: N/A;    Allergies  Allergen Reactions   Metrizamide Other (See Comments)    Kidney failure requiring dialysis Kidney failure requiring dialysis    Clindamycin/Lincomycin Rash   Lincomycin Rash   Sulfa Antibiotics Rash    Prior to Admission medications   Medication Sig Start Date End Date Taking? Authorizing Provider  albuterol (VENTOLIN HFA) 108 (90 Base) MCG/ACT inhaler Inhale 2 puffs into the lungs every 6 (six) hours as needed for wheezing or shortness of breath. 02/23/22   Sunnie Nielsen, DO  furosemide (LASIX) 40 MG tablet Take 1 tablet (40 mg total) by mouth every other day. 04/21/23   Mecum, Erin E, PA-C  magnesium oxide (MAG-OX) 400 (240 Mg) MG tablet Take 400 mg by mouth daily.    [provider]  metoprolol succinate (TOPROL-XL) 25  MG 24 hr tablet Take 1 tablet (25 mg total) by mouth daily. 02/23/22   Sunnie Nielsen, DO  OXYGEN Inhale 1 L into the lungs daily.    [provider]  sacubitril-valsartan (ENTRESTO) 24-26 MG Take 1 tablet by mouth 2 (two) times daily. 10/28/22   Delma Freeze, FNP  tirzepatide [facility]) 5 MG/0.5ML Pen Inject 5 mg into the skin once a week. 01/05/23   [name], NP  warfarin (COUMADIN) 5 MG tablet Take 1 tablet (5 mg total) by mouth daily. 12/23/22    [name], NP    Social History   Socioeconomic History   Marital status: Married    Spouse name: Not on file   Number of children: Not on file   Years of education: Not on file   Highest education level: Some college, no degree  Occupational History   Not on file  Tobacco Use   Smoking status: Never   Smokeless tobacco: Never  Vaping Use   Vaping status: Never Used  Substance and Sexual Activity   Alcohol use: No   Drug use: No   Sexual activity: Yes  Other Topics Concern   Not on file  Social History Narrative   Not on file   Social Determinants of Health   Financial Resource Strain: Low Risk  (12/30/2022)   Overall Financial Resource Strain (CARDIA)    Difficulty of Paying Living Expenses: Not very hard  Food Insecurity: No Food Insecurity (12/30/2022)   Hunger Vital Sign    Worried About Running Out of Food in the Last Year: Never true    Ran Out of Food in the Last Year: Never true  Transportation Needs: No Transportation Needs (12/30/2022)   PRAPARE - Administrator, Civil Service (Medical): No    Lack of Transportation (Non-Medical): No  Physical Activity: Insufficiently Active (12/30/2022)   Exercise Vital Sign    Days of Exercise per Week: 3 days    Minutes of Exercise per Session: 30 min  Stress: No Stress Concern Present (12/30/2022)   [employer] of Occupational Health - Occupational Stress Questionnaire    Feeling of Stress : Not at all  Social Connections: Unknown (12/30/2022)   Social Connection and Isolation Panel [NHANES]    Frequency of Communication with Friends and Family: Three times a week    Frequency of Social Gatherings with Friends and Family: Once a week    Attends Religious Services: More than 4 times per year    Active Member of [employer] or Organizations: Yes    Attends Engineer, structural: More than 4 times per year    Marital Status: Patient declined  Catering manager Violence: Not on file     Family History   Problem Relation Age of Onset   Hypertension Mother    Cancer Mother    Breast cancer Mother    Prostate cancer Father    Hypertension Father    Diabetes Father    Pulmonary embolism Paternal Uncle    Heart disease Maternal Grandmother    Pulmonary embolism Paternal Grandfather     ROS: Otherwise negative unless mentioned in HPI  Physical Examination  Vitals:   05/16/23 1015 05/16/23 1234  BP: 101/68 101/75  Pulse:    Resp: 20 18  Temp:    SpO2: 93% 93%   Body mass index is 50.9 kg/m.  General:  WDWN in NAD Gait: Not observed HENT: WNL, normocephalic Pulmonary: normal non-labored breathing, without Rales, rhonchi,  wheezing  Cardiac: irregular positive HX of atrial fibrillation, without  Murmurs, rubs or gallops; without carotid bruits Abdomen: Positive bowel sounds, soft, NT/ND, no masses Skin: without rashes Vascular Exam/Pulses: Unable to palpate pulse due to patients morbid obesity and edema to lower extremities.  Extremities: without ischemic changes, without Gangrene , without cellulitis; without open wounds;  Musculoskeletal: no muscle wasting or atrophy  Neurologic: A&O X 3;  No focal weakness or paresthesias are detected; speech is fluent/normal Psychiatric:  The pt has Normal affect. Lymph:  Unremarkable  CBC    Component Value Date/Time   WBC 4.4 05/16/2023 0911   RBC 5.56 05/16/2023 0911   HGB 16.8 05/16/2023 0911   HGB 15.8 12/23/2022 0953   HCT 49.7 05/16/2023 0911   HCT 47.3 12/23/2022 0953   PLT 163 05/16/2023 0911   PLT 297 12/23/2022 0953   MCV 89.4 05/16/2023 0911   MCV 92 12/23/2022 0953   MCV 90 12/28/2014 0444   MCH 30.2 05/16/2023 0911   MCHC 33.8 05/16/2023 0911   RDW 15.9 (H) 05/16/2023 0911   RDW 13.2 12/23/2022 0953   RDW 14.0 12/28/2014 0444   LYMPHSABS 1.5 01/19/2023 1043   LYMPHSABS 1.4 12/23/2022 0953   LYMPHSABS 1.3 12/28/2014 0444   MONOABS 0.5 01/19/2023 1043   MONOABS 2.1 (H) 12/28/2014 0444   EOSABS 0.1 01/19/2023  1043   EOSABS 0.1 12/23/2022 0953   EOSABS 0.1 12/28/2014 0444   BASOSABS 0.0 01/19/2023 1043   BASOSABS 0.0 12/23/2022 0953   BASOSABS 0.1 12/28/2014 0444    BMET    Component Value Date/Time   NA 132 (L) 05/16/2023 0911   NA 138 01/03/2023 1442   NA 130 (L) 12/28/2014 1602   K 3.8 05/16/2023 0911   K 3.8 12/28/2014 1602   CL 101 05/16/2023 0911   CL 94 (L) 12/28/2014 1602   CO2 20 (L) 05/16/2023 0911   CO2 24 12/28/2014 1602   GLUCOSE 113 (H) 05/16/2023 0911   GLUCOSE 121 (H) 12/28/2014 1602   BUN 19 05/16/2023 0911   BUN 11 01/03/2023 1442   BUN 57 (H) 12/28/2014 1602   CREATININE 1.77 (H) 05/16/2023 0911   CREATININE 5.55 (H) 12/28/2014 1602   CALCIUM 8.5 (L) 05/16/2023 0911   CALCIUM 8.1 (L) 12/28/2014 1602   GFRNONAA 45 (L) 05/16/2023 0911   GFRNONAA 11 (L) 12/28/2014 1602   GFRAA 55 (L) 03/07/2020 2009   GFRAA 13 (L) 12/28/2014 1602    COAGS: Lab Results  Component Value Date   INR 1.2 05/16/2023   INR 2.9 (H) 05/03/2023   INR 2.0 (H) 04/04/2023     Non-Invasive Vascular Imaging:   EXAM:05/16/23 CT ANGIOGRAPHY CHEST WITH CONTRAST   TECHNIQUE: Multidetector CT imaging of the chest was performed using the standard protocol during bolus administration of intravenous contrast. Multiplanar CT image reconstructions and MIPs were obtained to evaluate the vascular anatomy.   RADIATION DOSE REDUCTION: This exam was performed according to the departmental dose-optimization program which includes automated exposure control, adjustment of the mA and/or kV according to patient size and/or use of iterative reconstruction technique.   CONTRAST:  75mL OMNIPAQUE IOHEXOL 350 MG/ML SOLN   COMPARISON:  CT angiography chest from 12/13/2022.   FINDINGS: Cardiovascular: Redemonstration of large clot in the main right pulmonary artery, essentially similar to the prior study. There is extension of the clot into the right upper lobe lobar pulmonary artery, that is also  unchanged. However, there is interval increase in the peripheral nonocclusive thrombus in  the left lung lower lobe lobar pulmonary artery when compared to the prior exam. It is unclear whether this is acute or subacute, on the basis of this exam. There are additional, stable, several linear web like filling defects in the bilateral lower lobe pulmonary artery branches, compatible with chronic emboli. Redemonstration of compressed left ventricle, compatible with right heart failure/strain.   There is dilation of the main pulmonary trunk measuring up to 3.7 cm, which is nonspecific but can be seen with pulmonary artery hypertension.   Normal cardiac size. No pericardial effusion. No aortic aneurysm.   Mediastinum/Nodes: Visualized thyroid gland appears grossly unremarkable. No solid / cystic mediastinal masses. The esophagus is nondistended precluding optimal assessment. No axillary, mediastinal or hilar lymphadenopathy by size criteria.   Lungs/Pleura: The central tracheo-bronchial tree is patent. No mass or consolidation. No pleural effusion or pneumothorax. No suspicious lung nodules.   Upper Abdomen: Visualized upper abdominal viscera within normal limits. Suprarenal IVC filter again noted.   Musculoskeletal: Redemonstration of multiple dilated venous collaterals along the anterior chest wall. The visualized soft tissues of the chest wall are otherwise grossly unremarkable. No suspicious osseous lesions. There are mild multilevel degenerative changes in the visualized spine.   Review of the MIP images confirms the above findings.   IMPRESSION: 1. Redemonstration of large clot in the main right pulmonary artery, essentially similar to the prior study. There is extension of the clot into the right upper lobe lobar pulmonary artery, that is also unchanged. However, there is interval increase in the peripheral nonocclusive thrombus in the left lung lower lobe lobar  pulmonary artery when compared to the prior exam. 2. Redemonstration of compressed left ventricle, compatible with right heart failure/strain. 3. Dilation of the main pulmonary trunk measuring up to 3.7 cm, which is nonspecific but can be seen with pulmonary artery hypertension. 4. Multiple other nonacute observations, as described above.  Statin:  No. Beta Blocker:  Yes.   Aspirin:  No. ACEI:  No. ARB:  Yes.   CCB use:  No Other antiplatelets/anticoagulants:  Yes.   Coumadin    ASSESSMENT/PLAN: This is a 52 y.o. male who presents from wound care clinic with shortness of breath requiring 2 liters on nasal cannula oxygen at all times with oxygen saturations of 90% while on oxygen.  Upon workup patient was found to have a pulmonary embolism in the right main pulmonary artery which she has had prior.  There is an extension of the clot to the right upper lobe.  There is also interval increase in the peripheral nonocclusive thrombus in the left lower lung pulmonary artery when compared to prior exam.  He is also noted to have a compressed left ventricle compatible with right heart failure/strain.  He also has dilatation of the main pulmonary trunk which is nonspecific but can be seen with pulmonary artery hypertension.  Vascular surgery has been consulted for any extending pulmonary embolism.  Vascular surgery plans on taking the patient to the vascular lab tomorrow 05/17/2023 for a pulmonary thrombectomy.  He had a pulmonary thrombectomy back in April 2024.  I discussed in detail with the patient the procedure, benefits, risk, and complications.  He verbalizes understanding and wishes to proceed as soon as possible.  I answered all the patient's questions this afternoon.  Patient will be made n.p.o. after midnight for procedure tomorrow.  -I discussed the plan in detail with Dr. Levora Dredge MD and he is in agreement with the plan.   Marcie Bal [facility] 05/16/2023 12:58 PM

## 2023-05-16 NOTE — ED Notes (Signed)
See triage note  Presents with some SOB  States that started over the weekend  Hx of PE's in he past

## 2023-05-16 NOTE — ED Notes (Signed)
First Nurse Note: Pt to ED via [facility] from Wound center for ShOB, chills, and fatigue since this weekend. Pts SpO2 sats were 86% on room air. Pt has PRN O2 but was not wearing it, mostly uses at night. Pt SpO2 levels improved on O2.

## 2023-05-16 NOTE — Assessment & Plan Note (Signed)
Warfarin not resumed on admission at this time Heparin per pharmacy for acute on chronic PE

## 2023-05-16 NOTE — Assessment & Plan Note (Addendum)
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 after midnight in anticipation of possible vascular surgical intervention Admit to PCU, inpatient

## 2023-05-16 NOTE — Consult Note (Signed)
Pharmacy Consult Note - Anticoagulation  Pharmacy Consult for heparin Indication: pulmonary embolus  PATIENT MEASUREMENTS: Height: 5\' 7"  (170.2 cm) Weight: (!) 147.4 kg (325 lb) IBW/kg (Calculated) : 66.1 HEPARIN DW (KG): 102.1  VITAL SIGNS: Temp: 98.7 F (37.1 C) (09/16 0905) Temp Source: Oral (09/16 0905) BP: 115/86 (09/16 1400) Pulse Rate: 78 (09/16 1400)  Recent Labs    05/16/23 0911 05/16/23 0958  HGB 16.8  --   HCT 49.7  --   PLT 163  --   APTT  --  29  LABPROT  --  15.6*  INR  --  1.2  CREATININE 1.77*  --     Estimated Creatinine Clearance: 67.3 mL/min (A) (by C-G formula based on SCr of 1.77 mg/dL (H)).  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  Regular the workup has not been forthcoming) previously followed by Dr. Isaiah Serge at [facility]   Hepatic steatosis    Hypertension    Morbid obesity with BMI of 50.0-59.9, adult [facility])    Peripheral vascular disease (HCC)    Presence of IVC filter    Has 2 IVC filters with significant thrombus burden superior to the filter.   Pulmonary embolus (HCC) 12/2016   Initially just treated with anticoagulation, but in February 2023 in April 2024 treated with EKOS thrombectomy for submassive PE.   Ventricular trigeminy    Weakness of both lower limbs     ASSESSMENT: 52 y.o. male with PMH chronic PE s/p IVC filter x 2 in 2005 and 2012 (on warfarin, INR goal 2-3), diabetes, HTN is presenting with shortness of breath, attributed to pulmonary embolism. CT chest demonstrates large clot in right pulmonary artery with extension into RUL lobar pulmonary artery, essentially unchanged from prior study on  12/13/2022. Additionally, interval increase in peripheral nonocclusive thrombus in LLL lobar pulmonary artery. Imaging also shows compressed LV, consistent with right heart failure/strain. Patient is on chronic anticoagulation per chart review. Pharmacy has been consulted to initiate and manage heparin intravenous infusion.  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  Pertinent medications: PTA Warfarin 5 mg daily  Goal(s) of therapy: Heparin level 0.3 - 0.7 units/mL INR 2 - 3 Monitor platelets by anticoagulation protocol: Yes   Baseline anticoagulation labs: Recent Labs    05/16/23 0911 05/16/23 0958  APTT  --  29  INR  --  1.2  HGB 16.8  --   PLT 163  --    Warfarin Date INR Warfarin Dose 9/16 1.2 Held due to heparin drip  Heparin Date Time aPTT/HL Rate/Comment    PLAN: Heparin level therapeutic x 1 continue heparin infusion at 1700 units/hour. recheck heparin level in 6 hours, then daily once at least two levels are consecutively therapeutic. Monitor CBC daily while on heparin infusion.  Burnis Medin, PharmD, BCPS Clinical Pharmacist 05/16/2023 2:34 PM

## 2023-05-16 NOTE — Progress Notes (Signed)
Ryan [name], Ryan [name] ([id number]) [id number] Page 1 of 7 Visit Report for 05/16/2023 Chief Complaint Document Details Patient Name: Date of Service: Ryan [name], Ryan RLES [name]. 05/16/2023 8:00 [name] Medical Record Number: [id number] Patient Account Number: [id number] Date of Birth/Sex: Treating RN: [birth year] (52 y.o. Ryan [name]) Yevonne Pax Primary Care Provider: [name] Other Clinician: Referring Provider: Treating Provider/Extender: [name] in Treatment: 4 Information Obtained from: Patient Chief Complaint Left leg and left foot ulcers Electronic Signature(s) Signed: 05/16/2023 8:28:10 AM By: [name] PA-C Entered By: [name] on 05/16/2023 08:28:10 -------------------------------------------------------------------------------- HPI Details Patient Name: Date of Service: Ryan Elm RLES [name]. 05/16/2023 8:00 [name] Medical Record Number: [id number] Patient Account Number: [id number] Date of Birth/Sex: Treating RN: January 30, 1971 (52 y.o. Ryan [name] Primary Care Provider: [name] Other Clinician: Referring Provider: Treating Provider/Extender: [name] in Treatment: 4 History of Present Illness Chronic/Inactive Conditions Condition 1: 04-18-2023 patient screening ABI today was 0.83 and this was on the left. With that being said he had previous ABIs done formally on 06-25-2021 with [name] right ABI of 1.12 and [name] TBI of 1.03 on the left she had an ABI of 1.12 with [name] TBI of 0.98. HPI Description: 04-18-2023 upon evaluation today patient presents for initial inspection here in our clinic some concerning issues he is having with his left proximal foot as well as his left lower extremity. This actually appears to be more of [name] situation to be honest. He does have chronic lower extremity swelling secondary to what appears to be venous stasis he has not really had any wounds significantly before and tells me  that it has been [name] probably even 12 or so since he has last used his lymphedema pumps. He is not even sure if they are actually functioning anymore or not. With that being said this is 1 thing that I did want him to look into. He is on [name] fluid pill and he does not wear compression socks on [name] regular basis although I definitely think that something that he also really needs to be doing to be honest. He does not have any injury that occurred to cause these wounds they more or less spontaneously occurred as [name] result of what appears to be excessive swelling. Patient does have [name] history of diabetes mellitus type 2, congestive heart failure, lymphedema, venous hypertension, hypertension, peripheral vascular disease, and chronic kidney disease stage III. 8/26; this is [name] patient with chronic lymphedema. He has wounds on the left medial lower extremity and left medial foot. We have been using Hydrofera Blue and Urgo K2. He tells me he has [name] compression pump at home although it is missing apart and he is not really familiar with with which company to call. He also started on furosemide 20 mg every other day probably would do better with every day and he is talking to his physician about this. Ryan [name], Ryan [name] ([id number]) [id number] Page 2 of 7 05-05-2023 upon evaluation today patient appears to be doing pretty well currently in regard to his wound. Fortunately there does not appear to be any signs of infection he does have some slough and biofilm noted I did discuss with the patient today I do believe that he would benefit from [name] continuation of therapy with regard to his wound. I think we may need to increase the compression however and also think that he may benefit from targeted debridement today. 05-09-2023 upon evaluation today patient  05/16/2023 8:00 [name] Medical Record Number: [id number] Patient Account Number: [id number] Date of Birth/Sex: Treating RN: [birth year] (52 y.o. Ryan [name]) Yevonne Pax Primary Care Provider: [name] Other Clinician: Referring Provider: Treating Provider/Extender: [name] in Treatment: 4 Verbal / Phone  Orders: No Diagnosis Coding ICD-10 Coding Code Description E11.622 Type 2 diabetes mellitus with other skin ulcer L97.822 Non-pressure chronic ulcer of other part of left lower leg with fat layer exposed E11.621 Type 2 diabetes mellitus with foot ulcer L97.522 Non-pressure chronic ulcer of other part of left foot with fat layer exposed I50.42 Chronic combined systolic (congestive) and diastolic (congestive) heart failure I89.0 Lymphedema, not elsewhere classified 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 Bathing/ Shower/ Hygiene May shower with wound dressing protected with water repellent cover or cast protector. Anesthetic (Use 'Patient Medications' Section for Anesthetic Order Entry) Lidocaine applied to wound bed Edema Control - Lymphedema / Segmental Compressive Device / Other Elevate, Exercise Daily and [name] void Standing for Long Periods of Time. Elevate legs to the level of the heart and pump ankles as often as possible Elevate leg(s) parallel to the floor when sitting. Wound Treatment Wound #1 - Lower Leg Wound Laterality: Left, Medial Cleanser: Soap and Water 1 x Per Week/30 Days Discharge Instructions: Gently cleanse wound with antibacterial soap, rinse and pat dry prior to dressing wounds Cleanser: Wound Cleanser 1 x Per Week/30 Days Discharge Instructions: Wash your hands with soap and water. Remove old dressing, discard into plastic bag and place into trash. Cleanse the wound with Wound Cleanser prior to applying [name] clean dressing using gauze sponges, not tissues or cotton balls. Do not scrub or use excessive force. Pat dry using gauze sponges, not tissue or cotton balls. Prim Dressing: Hydrofera Blue Ready Transfer Foam,  2.5x2.5 (in/in) 1 x Per Week/30 Days ary Discharge Instructions: Apply Hydrofera Blue Ready to wound bed as directed Secondary Dressing: ABD Pad 5x9 (in/in) 1 x Per Week/30 Days Discharge Instructions: Cover with ABD pad Compression Wrap: Urgo K2, two layer compression system, large 1 x Per Week/30 Days Wound #2 - Foot Wound Laterality: Left, Medial Cleanser: Soap and Water 1 x Per Week/30 Days Discharge Instructions: Gently cleanse wound with antibacterial soap, rinse and pat dry prior to dressing wounds Cleanser: Wound Cleanser 1 x Per Week/30 Days Siebert, Ryan [name] ([id number]) [id number] Page 4 of 7 Discharge Instructions: Wash your hands with soap and water. Remove old dressing, discard into plastic bag and place into trash. Cleanse the wound with Wound Cleanser prior to applying [name] clean dressing using gauze sponges, not tissues or cotton balls. Do not scrub or use excessive force. Pat dry using gauze sponges, not tissue or cotton balls. Prim Dressing: Hydrofera Blue Ready Transfer Foam, 2.5x2.5 (in/in) 1 x Per Week/30 Days ary Discharge Instructions: Apply Hydrofera Blue Ready to wound bed as directed Secondary Dressing: ABD Pad 5x9 (in/in) 1 x Per Week/30 Days Discharge Instructions: Cover with ABD pad Compression Wrap: Urgo K2, two layer compression system, large 1 x Per Week/30 Days Electronic Signature(s) Signed: 05/16/2023 12:32:57 PM By: [name] PA-C Signed: 05/19/2023 1:18:15 PM By: Yevonne Pax RN Entered By: Yevonne Pax on 05/16/2023 08:39:38 -------------------------------------------------------------------------------- Problem List Details Patient Name: Date of Service: Ryan Elm RLES [name]. 05/16/2023 8:00 [name] Medical Record Number: [id number] Patient Account Number: [id number]  Ryan [name], Ryan [name] ([id number]) [id number] Page 1 of 7 Visit Report for 05/16/2023 Chief Complaint Document Details Patient Name: Date of Service: Ryan [name], Ryan RLES [name]. 05/16/2023 8:00 [name] Medical Record Number: [id number] Patient Account Number: [id number] Date of Birth/Sex: Treating RN: [birth year] (52 y.o. Ryan [name]) Yevonne Pax Primary Care Provider: [name] Other Clinician: Referring Provider: Treating Provider/Extender: [name] in Treatment: 4 Information Obtained from: Patient Chief Complaint Left leg and left foot ulcers Electronic Signature(s) Signed: 05/16/2023 8:28:10 AM By: [name] PA-C Entered By: [name] on 05/16/2023 08:28:10 -------------------------------------------------------------------------------- HPI Details Patient Name: Date of Service: Ryan Elm RLES [name]. 05/16/2023 8:00 [name] Medical Record Number: [id number] Patient Account Number: [id number] Date of Birth/Sex: Treating RN: January 30, 1971 (52 y.o. Ryan [name] Primary Care Provider: [name] Other Clinician: Referring Provider: Treating Provider/Extender: [name] in Treatment: 4 History of Present Illness Chronic/Inactive Conditions Condition 1: 04-18-2023 patient screening ABI today was 0.83 and this was on the left. With that being said he had previous ABIs done formally on 06-25-2021 with [name] right ABI of 1.12 and [name] TBI of 1.03 on the left she had an ABI of 1.12 with [name] TBI of 0.98. HPI Description: 04-18-2023 upon evaluation today patient presents for initial inspection here in our clinic some concerning issues he is having with his left proximal foot as well as his left lower extremity. This actually appears to be more of [name] situation to be honest. He does have chronic lower extremity swelling secondary to what appears to be venous stasis he has not really had any wounds significantly before and tells me  that it has been [name] probably even 12 or so since he has last used his lymphedema pumps. He is not even sure if they are actually functioning anymore or not. With that being said this is 1 thing that I did want him to look into. He is on [name] fluid pill and he does not wear compression socks on [name] regular basis although I definitely think that something that he also really needs to be doing to be honest. He does not have any injury that occurred to cause these wounds they more or less spontaneously occurred as [name] result of what appears to be excessive swelling. Patient does have [name] history of diabetes mellitus type 2, congestive heart failure, lymphedema, venous hypertension, hypertension, peripheral vascular disease, and chronic kidney disease stage III. 8/26; this is [name] patient with chronic lymphedema. He has wounds on the left medial lower extremity and left medial foot. We have been using Hydrofera Blue and Urgo K2. He tells me he has [name] compression pump at home although it is missing apart and he is not really familiar with with which company to call. He also started on furosemide 20 mg every other day probably would do better with every day and he is talking to his physician about this. Ryan [name], Ryan [name] ([id number]) [id number] Page 2 of 7 05-05-2023 upon evaluation today patient appears to be doing pretty well currently in regard to his wound. Fortunately there does not appear to be any signs of infection he does have some slough and biofilm noted I did discuss with the patient today I do believe that he would benefit from [name] continuation of therapy with regard to his wound. I think we may need to increase the compression however and also think that he may benefit from targeted debridement today. 05-09-2023 upon evaluation today patient  05/16/2023 8:00 [name] Medical Record Number: [id number] Patient Account Number: [id number] Date of Birth/Sex: Treating RN: [birth year] (52 y.o. Ryan [name]) Yevonne Pax Primary Care Provider: [name] Other Clinician: Referring Provider: Treating Provider/Extender: [name] in Treatment: 4 Verbal / Phone  Orders: No Diagnosis Coding ICD-10 Coding Code Description E11.622 Type 2 diabetes mellitus with other skin ulcer L97.822 Non-pressure chronic ulcer of other part of left lower leg with fat layer exposed E11.621 Type 2 diabetes mellitus with foot ulcer L97.522 Non-pressure chronic ulcer of other part of left foot with fat layer exposed I50.42 Chronic combined systolic (congestive) and diastolic (congestive) heart failure I89.0 Lymphedema, not elsewhere classified 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 Bathing/ Shower/ Hygiene May shower with wound dressing protected with water repellent cover or cast protector. Anesthetic (Use 'Patient Medications' Section for Anesthetic Order Entry) Lidocaine applied to wound bed Edema Control - Lymphedema / Segmental Compressive Device / Other Elevate, Exercise Daily and [name] void Standing for Long Periods of Time. Elevate legs to the level of the heart and pump ankles as often as possible Elevate leg(s) parallel to the floor when sitting. Wound Treatment Wound #1 - Lower Leg Wound Laterality: Left, Medial Cleanser: Soap and Water 1 x Per Week/30 Days Discharge Instructions: Gently cleanse wound with antibacterial soap, rinse and pat dry prior to dressing wounds Cleanser: Wound Cleanser 1 x Per Week/30 Days Discharge Instructions: Wash your hands with soap and water. Remove old dressing, discard into plastic bag and place into trash. Cleanse the wound with Wound Cleanser prior to applying [name] clean dressing using gauze sponges, not tissues or cotton balls. Do not scrub or use excessive force. Pat dry using gauze sponges, not tissue or cotton balls. Prim Dressing: Hydrofera Blue Ready Transfer Foam,  2.5x2.5 (in/in) 1 x Per Week/30 Days ary Discharge Instructions: Apply Hydrofera Blue Ready to wound bed as directed Secondary Dressing: ABD Pad 5x9 (in/in) 1 x Per Week/30 Days Discharge Instructions: Cover with ABD pad Compression Wrap: Urgo K2, two layer compression system, large 1 x Per Week/30 Days Wound #2 - Foot Wound Laterality: Left, Medial Cleanser: Soap and Water 1 x Per Week/30 Days Discharge Instructions: Gently cleanse wound with antibacterial soap, rinse and pat dry prior to dressing wounds Cleanser: Wound Cleanser 1 x Per Week/30 Days Siebert, Ryan [name] ([id number]) [id number] Page 4 of 7 Discharge Instructions: Wash your hands with soap and water. Remove old dressing, discard into plastic bag and place into trash. Cleanse the wound with Wound Cleanser prior to applying [name] clean dressing using gauze sponges, not tissues or cotton balls. Do not scrub or use excessive force. Pat dry using gauze sponges, not tissue or cotton balls. Prim Dressing: Hydrofera Blue Ready Transfer Foam, 2.5x2.5 (in/in) 1 x Per Week/30 Days ary Discharge Instructions: Apply Hydrofera Blue Ready to wound bed as directed Secondary Dressing: ABD Pad 5x9 (in/in) 1 x Per Week/30 Days Discharge Instructions: Cover with ABD pad Compression Wrap: Urgo K2, two layer compression system, large 1 x Per Week/30 Days Electronic Signature(s) Signed: 05/16/2023 12:32:57 PM By: [name] PA-C Signed: 05/19/2023 1:18:15 PM By: Yevonne Pax RN Entered By: Yevonne Pax on 05/16/2023 08:39:38 -------------------------------------------------------------------------------- Problem List Details Patient Name: Date of Service: Ryan Elm RLES [name]. 05/16/2023 8:00 [name] Medical Record Number: [id number] Patient Account Number: [id number]  05/16/2023 8:00 [name] Medical Record Number: [id number] Patient Account Number: [id number] Date of Birth/Sex: Treating RN: [birth year] (52 y.o. Ryan [name]) Yevonne Pax Primary Care Provider: [name] Other Clinician: Referring Provider: Treating Provider/Extender: [name] in Treatment: 4 Verbal / Phone  Orders: No Diagnosis Coding ICD-10 Coding Code Description E11.622 Type 2 diabetes mellitus with other skin ulcer L97.822 Non-pressure chronic ulcer of other part of left lower leg with fat layer exposed E11.621 Type 2 diabetes mellitus with foot ulcer L97.522 Non-pressure chronic ulcer of other part of left foot with fat layer exposed I50.42 Chronic combined systolic (congestive) and diastolic (congestive) heart failure I89.0 Lymphedema, not elsewhere classified 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 Bathing/ Shower/ Hygiene May shower with wound dressing protected with water repellent cover or cast protector. Anesthetic (Use 'Patient Medications' Section for Anesthetic Order Entry) Lidocaine applied to wound bed Edema Control - Lymphedema / Segmental Compressive Device / Other Elevate, Exercise Daily and [name] void Standing for Long Periods of Time. Elevate legs to the level of the heart and pump ankles as often as possible Elevate leg(s) parallel to the floor when sitting. Wound Treatment Wound #1 - Lower Leg Wound Laterality: Left, Medial Cleanser: Soap and Water 1 x Per Week/30 Days Discharge Instructions: Gently cleanse wound with antibacterial soap, rinse and pat dry prior to dressing wounds Cleanser: Wound Cleanser 1 x Per Week/30 Days Discharge Instructions: Wash your hands with soap and water. Remove old dressing, discard into plastic bag and place into trash. Cleanse the wound with Wound Cleanser prior to applying [name] clean dressing using gauze sponges, not tissues or cotton balls. Do not scrub or use excessive force. Pat dry using gauze sponges, not tissue or cotton balls. Prim Dressing: Hydrofera Blue Ready Transfer Foam,  2.5x2.5 (in/in) 1 x Per Week/30 Days ary Discharge Instructions: Apply Hydrofera Blue Ready to wound bed as directed Secondary Dressing: ABD Pad 5x9 (in/in) 1 x Per Week/30 Days Discharge Instructions: Cover with ABD pad Compression Wrap: Urgo K2, two layer compression system, large 1 x Per Week/30 Days Wound #2 - Foot Wound Laterality: Left, Medial Cleanser: Soap and Water 1 x Per Week/30 Days Discharge Instructions: Gently cleanse wound with antibacterial soap, rinse and pat dry prior to dressing wounds Cleanser: Wound Cleanser 1 x Per Week/30 Days Siebert, Ryan [name] ([id number]) [id number] Page 4 of 7 Discharge Instructions: Wash your hands with soap and water. Remove old dressing, discard into plastic bag and place into trash. Cleanse the wound with Wound Cleanser prior to applying [name] clean dressing using gauze sponges, not tissues or cotton balls. Do not scrub or use excessive force. Pat dry using gauze sponges, not tissue or cotton balls. Prim Dressing: Hydrofera Blue Ready Transfer Foam, 2.5x2.5 (in/in) 1 x Per Week/30 Days ary Discharge Instructions: Apply Hydrofera Blue Ready to wound bed as directed Secondary Dressing: ABD Pad 5x9 (in/in) 1 x Per Week/30 Days Discharge Instructions: Cover with ABD pad Compression Wrap: Urgo K2, two layer compression system, large 1 x Per Week/30 Days Electronic Signature(s) Signed: 05/16/2023 12:32:57 PM By: [name] PA-C Signed: 05/19/2023 1:18:15 PM By: Yevonne Pax RN Entered By: Yevonne Pax on 05/16/2023 08:39:38 -------------------------------------------------------------------------------- Problem List Details Patient Name: Date of Service: Ryan Elm RLES [name]. 05/16/2023 8:00 [name] Medical Record Number: [id number] Patient Account Number: [id number]  Ryan [name], Ryan [name] ([id number]) [id number] Page 1 of 7 Visit Report for 05/16/2023 Chief Complaint Document Details Patient Name: Date of Service: Ryan [name], Ryan RLES [name]. 05/16/2023 8:00 [name] Medical Record Number: [id number] Patient Account Number: [id number] Date of Birth/Sex: Treating RN: [birth year] (52 y.o. Ryan [name]) Yevonne Pax Primary Care Provider: [name] Other Clinician: Referring Provider: Treating Provider/Extender: [name] in Treatment: 4 Information Obtained from: Patient Chief Complaint Left leg and left foot ulcers Electronic Signature(s) Signed: 05/16/2023 8:28:10 AM By: [name] PA-C Entered By: [name] on 05/16/2023 08:28:10 -------------------------------------------------------------------------------- HPI Details Patient Name: Date of Service: Ryan Elm RLES [name]. 05/16/2023 8:00 [name] Medical Record Number: [id number] Patient Account Number: [id number] Date of Birth/Sex: Treating RN: January 30, 1971 (52 y.o. Ryan [name] Primary Care Provider: [name] Other Clinician: Referring Provider: Treating Provider/Extender: [name] in Treatment: 4 History of Present Illness Chronic/Inactive Conditions Condition 1: 04-18-2023 patient screening ABI today was 0.83 and this was on the left. With that being said he had previous ABIs done formally on 06-25-2021 with [name] right ABI of 1.12 and [name] TBI of 1.03 on the left she had an ABI of 1.12 with [name] TBI of 0.98. HPI Description: 04-18-2023 upon evaluation today patient presents for initial inspection here in our clinic some concerning issues he is having with his left proximal foot as well as his left lower extremity. This actually appears to be more of [name] situation to be honest. He does have chronic lower extremity swelling secondary to what appears to be venous stasis he has not really had any wounds significantly before and tells me  that it has been [name] probably even 12 or so since he has last used his lymphedema pumps. He is not even sure if they are actually functioning anymore or not. With that being said this is 1 thing that I did want him to look into. He is on [name] fluid pill and he does not wear compression socks on [name] regular basis although I definitely think that something that he also really needs to be doing to be honest. He does not have any injury that occurred to cause these wounds they more or less spontaneously occurred as [name] result of what appears to be excessive swelling. Patient does have [name] history of diabetes mellitus type 2, congestive heart failure, lymphedema, venous hypertension, hypertension, peripheral vascular disease, and chronic kidney disease stage III. 8/26; this is [name] patient with chronic lymphedema. He has wounds on the left medial lower extremity and left medial foot. We have been using Hydrofera Blue and Urgo K2. He tells me he has [name] compression pump at home although it is missing apart and he is not really familiar with with which company to call. He also started on furosemide 20 mg every other day probably would do better with every day and he is talking to his physician about this. Ryan [name], Ryan [name] ([id number]) [id number] Page 2 of 7 05-05-2023 upon evaluation today patient appears to be doing pretty well currently in regard to his wound. Fortunately there does not appear to be any signs of infection he does have some slough and biofilm noted I did discuss with the patient today I do believe that he would benefit from [name] continuation of therapy with regard to his wound. I think we may need to increase the compression however and also think that he may benefit from targeted debridement today. 05-09-2023 upon evaluation today patient  Date of Birth/Sex: Treating RN: Apr 06, 1971 (52 y.o. Ryan [name]) [name], [name] Primary Care Provider: [name] Other Clinician: Referring Provider: Treating  Provider/Extender: [name] in Treatment: 4 Active Problems ICD-10 Encounter Code Description Active Date MDM Diagnosis E11.622 Type 2 diabetes mellitus with other skin ulcer 04/18/2023 No Yes L97.822 Non-pressure chronic ulcer of other part of left lower leg with fat layer exposed8/19/2024 No Yes E11.621 Type 2 diabetes mellitus with foot ulcer 04/18/2023 No Yes L97.522 Non-pressure chronic ulcer of other part of left foot with fat layer exposed 04/18/2023 No Yes I50.42 Chronic combined systolic (congestive) and diastolic (congestive) heart failure 04/18/2023 No Yes I89.0 Lymphedema, not elsewhere classified 04/18/2023 No Yes I87.332 Chronic venous hypertension (idiopathic) with ulcer and inflammation of left 04/18/2023 No Yes lower extremity Shimel, Derril [name] ([id number]) [id number] Page 5 of 7 I10 Essential (primary) hypertension 04/18/2023 No Yes I73.89 Other specified peripheral vascular diseases 04/18/2023 No Yes N18.30 Chronic kidney disease, stage 3 unspecified 04/18/2023 No Yes Inactive Problems Resolved Problems Electronic Signature(s) Signed: 05/16/2023 8:27:35 AM By: [name] PA-C Entered By: [name] on 05/16/2023 08:27:35 -------------------------------------------------------------------------------- Progress Note Details Patient Name: Date of Service: Ryan Elm RLES [name]. 05/16/2023 8:00 [name] Medical Record Number: [id number] Patient Account Number: [id number] Date of Birth/Sex: Treating RN: [birth year] (52 y.o. Ryan [name] Primary Care Provider: [name] Other Clinician: Referring Provider: Treating Provider/Extender: [name] in Treatment: 4 Subjective Chief Complaint Information obtained from Patient Left leg and left foot ulcers History of Present Illness (HPI) Chronic/Inactive Condition: 04-18-2023 patient screening ABI today was 0.83 and this was on the left. With that being  said he had previous ABIs done formally on 06-25-2021 with [name] right ABI of 1.12 and [name] TBI of 1.03 on the left she had an ABI of 1.12 with [name] TBI of 0.98. 04-18-2023 upon evaluation today patient presents for initial inspection here in our clinic some concerning issues he is having with his left proximal foot as well as his left lower extremity. This actually appears to be more of [name] situation to be honest. He does have chronic lower extremity swelling secondary to what appears to be venous stasis he has not really had any wounds significantly before and tells me that it has been [name] probably even 12 or so since he has last used his lymphedema pumps. He is not even sure if they are actually functioning anymore or not. With that being said this is 1 thing that I did want him to look into. He is on [name] fluid pill and he does not wear compression socks on [name] regular basis although I definitely think that something that he also really needs to be doing to be honest. He does not have any injury that occurred to cause these wounds they more or less spontaneously occurred as [name] result of what appears to be excessive swelling. Patient does have [name] history of diabetes mellitus type 2, congestive heart failure, lymphedema, venous hypertension, hypertension, peripheral vascular disease, and chronic kidney disease stage III. 8/26; this is [name] patient with chronic lymphedema. He has wounds on the left medial lower extremity and left medial foot. We have been using Hydrofera Blue and Urgo K2. He tells me he has [name] compression pump at home although it is missing apart and he is not really familiar with with which company to call. He also started on furosemide 20 mg every other day probably would do better with every

## 2023-05-16 NOTE — Hospital Course (Addendum)
Mr. 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  Vitals in the ED showed temperature 98.7, respiration rate of 18, heart rate of 88, blood pressure 108/83, SpO2 of 93% on 2 L nasal cannula.  Serum sodium is 132, potassium 3.8, chloride 101, bicarb 20, BUN of 19, serum creatinine of 1.77, nonfasting blood glucose 113, WBC 4.4, hemoglobin 16.8, platelets of 163.  INR is 1.2, PT 15.6, PTT 29.  ED treatment: Heparin per pharmacy and EDP consulted vascular specialist for thrombectomy.  Per EDP, vascular surgery responded to EDP page and at the time of this dictation, patient is scheduled for pulmonary thrombectomy on 05/17/23.

## 2023-05-16 NOTE — ED Notes (Signed)
Patient transported to CT 

## 2023-05-16 NOTE — ED Provider Notes (Addendum)
[facility] Provider Note    Event Date/Time   First MD Initiated Contact with Patient 05/16/23 [phone]     (approximate)   History   Shortness of Breath   HPI  Ryan [name] is a 52 y.o. male with a history of PE, diabetes, hypertension, IVC filter on Coumadin presents with shortness of breath.  Was at wound care for follow-up found to be hypoxic there, patient typically uses oxygen 2 L at night as needed, is needing 2 L nasal cannula to keep saturations above 90% at this time.  Review of records demonstrate the patient has had mechanical thrombectomy and April of this year for acute on chronic PE     Physical Exam   Triage Vital Signs: ED Triage Vitals  Encounter Vitals Group     BP 05/16/23 0905 108/83     Systolic BP Percentile --      Diastolic BP Percentile --      Pulse Rate 05/16/23 0905 88     Resp 05/16/23 0905 18     Temp 05/16/23 0905 98.7 F (37.1 C)     Temp Source 05/16/23 0905 Oral     SpO2 05/16/23 0905 93 %     Weight 05/16/23 0906 (!) 147.4 kg (325 lb)     Height 05/16/23 0906 1.702 m (5\' 7" )     Head Circumference --      Peak Flow --      Pain Score 05/16/23 0905 5     Pain Loc --      Pain Education --      Exclude from Growth Chart --     Most recent vital signs: Vitals:   05/16/23 1015 05/16/23 1234  BP: 101/68 101/75  Pulse:    Resp: 20 18  Temp:    SpO2: 93% 93%     General: Awake, no distress.  CV:  Good peripheral perfusion.  Resp:  Normal effort.  No wheezing Abd:  No distention.  Other:     ED Results / Procedures / Treatments   Labs (all labs ordered are listed, but only abnormal results are displayed) Labs Reviewed  BASIC METABOLIC PANEL - Abnormal; Notable for the following components:      Result Value   Sodium 132 (*)    CO2 20 (*)    Glucose, Bld 113 (*)    Creatinine, Ser 1.77 (*)    Calcium 8.5 (*)    GFR, Estimated 45 (*)    All other components within normal limits  CBC -  Abnormal; Notable for the following components:   RDW 15.9 (*)    All other components within normal limits  PROTIME-INR - Abnormal; Notable for the following components:   Prothrombin Time 15.6 (*)    All other components within normal limits     EKG  ED ECG REPORT I, Jene Every, the attending physician, personally viewed and interpreted this ECG.  Date: 05/16/2023  Rhythm: normal sinus rhythm QRS Axis: normal Intervals: normal ST/T Wave abnormalities: Nonspecific changes Narrative Interpretation: no evidence of acute ischemia    RADIOLOGY X-ray without acute abnormality    PROCEDURES:  Critical Care performed: yes  CRITICAL CARE Performed by: Jene Every   Total critical care time: 35 minutes  Critical care time was exclusive of separately billable procedures and treating other patients.  Critical care was necessary to treat or prevent imminent or life-threatening deterioration.  Critical care was time spent personally by me on  the following activities: development of treatment plan with patient and/or surrogate as well as nursing, discussions with consultants, evaluation of patient's response to treatment, examination of patient, obtaining history from patient or surrogate, ordering and performing treatments and interventions, ordering and review of laboratory studies, ordering and review of radiographic studies, pulse oximetry and re-evaluation of patient's condition.   Procedures   MEDICATIONS ORDERED IN ED: Medications  iohexol (OMNIPAQUE) 350 MG/ML injection 100 mL (75 mLs Intravenous Contrast Given 05/16/23 1106)     IMPRESSION / MDM / ASSESSMENT AND PLAN / ED COURSE  I reviewed the triage vital signs and the nursing notes. Patient's presentation is most consistent with acute presentation with potential threat to life or bodily function.  Patient presents with shortness of breath as detailed above, found to be hypoxic without nasal cannula  oxygen.  Differential includes acute on chronic PE, pneumonia, less likely CHF versus pulmonary hypertension.  X-ray is clear, lab work reviewed and is overall reassuring, will add on INR and send for CT angiography  ----------------------------------------- 12:45 PM on 05/16/2023 ----------------------------------------- CT scan demonstrates continued clot with some propagation into the left lower lung, discussed with Dr. Gilda Crease who agrees with heparin, will consult on the patient  Will consult the hospitalist service for admission     FINAL CLINICAL IMPRESSION(S) / ED DIAGNOSES   Final diagnoses:  Pulmonary embolism with acute cor pulmonale, unspecified chronicity, unspecified pulmonary embolism type (HCC)     Rx / DC Orders   ED Discharge Orders     None        Note:  This document was prepared using Dragon voice recognition software and may include unintentional dictation errors.   Jene Every, MD 05/16/23 1245    Jene Every, MD 05/16/23 1245

## 2023-05-16 NOTE — Assessment & Plan Note (Signed)
Secondary to chronic PE

## 2023-05-16 NOTE — Assessment & Plan Note (Addendum)
Secondary to acute on chronic PE Continue oxygen support to maintain SpO2 greater than 92% Treat per above

## 2023-05-16 NOTE — ED Triage Notes (Signed)
Pt presents to the ED via [facility] from home for [facility] that started over the weekend. Pt states that he has PRN oxygen that he typically wears at night. At wound care clinic patient was 86% on RA. Pt is wearing 2L oxygen via [place] with an oxygen saturation of 93% at time of triage. Pt denies CP. Reports wound to left inner leg, but was seen at wound clinic this morning and they said that the wound looks good per patient. Pt A&Ox4 at time of triage

## 2023-05-16 NOTE — Assessment & Plan Note (Signed)
-  CPAP nightly ordered

## 2023-05-17 ENCOUNTER — Inpatient Hospital Stay (HOSPITAL_COMMUNITY)
Admit: 2023-05-17 | Discharge: 2023-05-17 | Disposition: A | Discharge status: Home / Self Care | Payer: No Typology Code available for payment source | Attending: Internal Medicine | Admitting: Internal Medicine

## 2023-05-17 ENCOUNTER — Encounter: Payer: Self-pay | Admitting: Internal Medicine

## 2023-05-17 ENCOUNTER — Encounter
Admission: EM | Disposition: A | Discharge status: Home / Self Care | Payer: Self-pay | Source: Ambulatory Visit | Attending: Internal Medicine

## 2023-05-17 DIAGNOSIS — I2699 Other pulmonary embolism without acute cor pulmonale: Secondary | ICD-10-CM | POA: Diagnosis not present

## 2023-05-17 DIAGNOSIS — I50810 Right heart failure, unspecified: Secondary | ICD-10-CM | POA: Diagnosis not present

## 2023-05-17 DIAGNOSIS — I2609 Other pulmonary embolism with acute cor pulmonale: Secondary | ICD-10-CM | POA: Diagnosis not present

## 2023-05-17 HISTORY — PX: PULMONARY THROMBECTOMY: CATH118295

## 2023-05-17 LAB — ECHOCARDIOGRAM COMPLETE
AR max vel: 2.42 cm2
AV Area VTI: 2.31 cm2
AV Area mean vel: 2.12 cm2
AV Mean grad: 4 mmHg
AV Peak grad: 7.8 mmHg
Ao pk vel: 1.4 m/s
Area-P 1/2: 2.55 cm2
Height: 67 in
MV VTI: 2.08 cm2
S' Lateral: 3.2 cm
Weight: 5200 [oz_av]

## 2023-05-17 LAB — CBC
HCT: 47.6 % (ref 39.0–52.0)
Hemoglobin: 15.8 g/dL (ref 13.0–17.0)
MCH: 29.9 pg (ref 26.0–34.0)
MCHC: 33.2 g/dL (ref 30.0–36.0)
MCV: 90.2 fL (ref 80.0–100.0)
Platelets: 164 10*3/uL (ref 150–400)
RBC: 5.28 MIL/uL (ref 4.22–5.81)
RDW: 16.1 % — ABNORMAL HIGH (ref 11.5–15.5)
WBC: 5.2 10*3/uL (ref 4.0–10.5)
nRBC: 0 % (ref 0.0–0.2)

## 2023-05-17 LAB — BASIC METABOLIC PANEL
Anion gap: 10 (ref 5–15)
BUN: 22 mg/dL — ABNORMAL HIGH (ref 6–20)
CO2: 21 mmol/L — ABNORMAL LOW (ref 22–32)
Calcium: 8.1 mg/dL — ABNORMAL LOW (ref 8.9–10.3)
Chloride: 101 mmol/L (ref 98–111)
Creatinine, Ser: 1.84 mg/dL — ABNORMAL HIGH (ref 0.61–1.24)
GFR, Estimated: 43 mL/min — ABNORMAL LOW (ref >60)
Glucose, Bld: 114 mg/dL — ABNORMAL HIGH (ref 70–99)
Potassium: 3.9 mmol/L (ref 3.5–5.1)
Sodium: 132 mmol/L — ABNORMAL LOW (ref 135–145)

## 2023-05-17 LAB — PROTIME-INR
INR: 1.3 — ABNORMAL HIGH (ref 0.8–1.2)
Prothrombin Time: 16.6 s — ABNORMAL HIGH (ref 11.4–15.2)

## 2023-05-17 LAB — HEPARIN LEVEL (UNFRACTIONATED): Heparin Unfractionated: 0.37 [IU]/mL (ref 0.30–0.70)

## 2023-05-17 SURGERY — PULMONARY THROMBECTOMY
Anesthesia: Moderate Sedation

## 2023-05-17 MED ORDER — HEPARIN SODIUM (PORCINE) 1000 UNIT/ML IJ SOLN
INTRAMUSCULAR | Status: AC
  Filled 2023-05-17: qty 10

## 2023-05-17 MED ORDER — MIDAZOLAM HCL 2 MG/2ML IJ SOLN
INTRAMUSCULAR | Status: AC
  Filled 2023-05-17: qty 2

## 2023-05-17 MED ORDER — HEPARIN SODIUM (PORCINE) 1000 UNIT/ML IJ SOLN
INTRAMUSCULAR | Status: DC | PRN
  Administered 2023-05-17: 4000 [IU] via INTRAVENOUS

## 2023-05-17 MED ORDER — HYDROMORPHONE HCL 1 MG/ML IJ SOLN: 1.0000 mg | Freq: Once | INTRAMUSCULAR | Status: DC | PRN

## 2023-05-17 MED ORDER — ALTEPLASE 2 MG IJ SOLR
INTRAMUSCULAR | Status: AC
  Filled 2023-05-17: qty 10

## 2023-05-17 MED ORDER — FENTANYL CITRATE (PF) 100 MCG/2ML IJ SOLN
INTRAMUSCULAR | Status: DC | PRN
  Administered 2023-05-17 (×2): 25 ug via INTRAVENOUS

## 2023-05-17 MED ORDER — ONDANSETRON HCL 4 MG/2ML IJ SOLN: 4.0000 mg | Freq: Four times a day (QID) | INTRAMUSCULAR | Status: DC | PRN

## 2023-05-17 MED ORDER — LACTATED RINGERS IV SOLN: INTRAVENOUS | Status: DC

## 2023-05-17 MED ORDER — FENTANYL CITRATE PF 50 MCG/ML IJ SOSY
PREFILLED_SYRINGE | INTRAMUSCULAR | Status: AC
  Filled 2023-05-17: qty 1

## 2023-05-17 MED ORDER — MIDAZOLAM HCL 2 MG/2ML IJ SOLN
INTRAMUSCULAR | Status: DC | PRN
  Administered 2023-05-17 (×2): 1 mg via INTRAVENOUS

## 2023-05-17 MED ORDER — HEPARIN (PORCINE) 25000 UT/250ML-% IV SOLN
2200.0000 [IU]/h | INTRAVENOUS | Status: DC
  Administered 2023-05-18 – 2023-05-19 (×2): 2000 [IU]/h via INTRAVENOUS
  Administered 2023-05-19 – 2023-05-20 (×2): 2200 [IU]/h via INTRAVENOUS
  Filled 2023-05-17 (×5): qty 250

## 2023-05-17 MED ORDER — DIPHENHYDRAMINE HCL 50 MG/ML IJ SOLN: 50.0000 mg | Freq: Once | INTRAMUSCULAR | Status: DC | PRN

## 2023-05-17 MED ORDER — METHYLPREDNISOLONE SODIUM SUCC 125 MG IJ SOLR: 125.0000 mg | Freq: Once | INTRAMUSCULAR | Status: DC | PRN

## 2023-05-17 MED ORDER — CEFAZOLIN SODIUM-DEXTROSE 2-4 GM/100ML-% IV SOLN: 2.0000 g | INTRAVENOUS | Status: DC

## 2023-05-17 MED ORDER — FAMOTIDINE 20 MG PO TABS: 40.0000 mg | ORAL_TABLET | Freq: Once | ORAL | Status: DC | PRN

## 2023-05-17 MED ORDER — ALTEPLASE 2 MG IJ SOLR
INTRAMUSCULAR | Status: DC | PRN
  Administered 2023-05-17: 10 mg

## 2023-05-17 MED ORDER — SODIUM CHLORIDE 0.9 % IV SOLN: INTRAVENOUS | Status: DC

## 2023-05-17 MED ORDER — CEFAZOLIN SODIUM-DEXTROSE 2-4 GM/100ML-% IV SOLN
INTRAVENOUS | Status: AC
  Filled 2023-05-17: qty 100

## 2023-05-17 MED ORDER — MIDAZOLAM HCL 2 MG/ML PO SYRP: 8.0000 mg | ORAL_SOLUTION | Freq: Once | ORAL | Status: DC | PRN

## 2023-05-17 MED ORDER — FENTANYL CITRATE PF 50 MCG/ML IJ SOSY: 12.5000 ug | PREFILLED_SYRINGE | Freq: Once | INTRAMUSCULAR | Status: DC | PRN

## 2023-05-17 SURGICAL SUPPLY — 26 items
ADH SKN CLS APL DERMABOND .7 (GAUZE/BANDAGES/DRESSINGS) ×1
CANISTER PENUMBRA ENGINE (MISCELLANEOUS) IMPLANT
CANNULA 5F STIFF (CANNULA) IMPLANT
CATH ANGIO 5F PIGTAIL 65CM (CATHETERS) IMPLANT
CATH BEACON 5 .035 65 KMP TIP (CATHETERS) IMPLANT
CATH INFUS 90CMX20CM (CATHETERS) IMPLANT
CATH LIGHTNI FLASH 16XTORQ 100 (CATHETERS) IMPLANT
CATH LIGHTNING FLASH XTORQ 100 (CATHETERS) ×1
CATH VS15FR (CATHETERS) IMPLANT
CLOSURE PERCLOSE PROSTYLE (VASCULAR PRODUCTS) IMPLANT
COVER PROBE ULTRASOUND 5X96 (MISCELLANEOUS) IMPLANT
DERMABOND ADVANCED .7 DNX12 (GAUZE/BANDAGES/DRESSINGS) IMPLANT
GLIDEWIRE ADV .035X180CM (WIRE) IMPLANT
GLIDEWIRE STIFF .35X180X3 HYDR (WIRE) IMPLANT
NDL ENTRY 21GA 7CM ECHOTIP (NEEDLE) IMPLANT
NEEDLE ENTRY 21GA 7CM ECHOTIP (NEEDLE) ×1 IMPLANT
PACK ANGIOGRAPHY (CUSTOM PROCEDURE TRAY) ×1 IMPLANT
SET INTRO CAPELLA COAXIAL (SET/KITS/TRAYS/PACK) IMPLANT
SHEATH BRITE TIP 6FRX11 (SHEATH) IMPLANT
SHEATH INTRO CHECKFLO 16F 13 (SHEATH) IMPLANT
SUT MNCRL AB 4-0 PS2 18 (SUTURE) IMPLANT
SYR MEDRAD MARK 7 150ML (SYRINGE) IMPLANT
TUBING CONTRAST HIGH PRESS 72 (TUBING) IMPLANT
WIRE AMPLATZ SSTIFF .035X260CM (WIRE) IMPLANT
WIRE GUIDERIGHT .035X150 (WIRE) IMPLANT
WIRE SUPRACORE 190CM (WIRE) IMPLANT

## 2023-05-17 NOTE — Op Note (Signed)
[facility]  Percutaneous Study/Intervention Procedural Note   Date of Surgery: 05/17/2023,7:10 PM  Surgeon:Ryan [name], Ryan [name]   Pre-operative Diagnosis: Symptomatic pulmonary emboli with right heart strain and hypoxia  Post-operative diagnosis:  Same  Procedure(s) Performed:  1.  Contrast injection right heart and bilateral pulmonary arteries  2.  Thrombolysis bilateral pulmonary arteries with 10 mg of TPA  3.  Mechanical thrombectomy right middle and lower lobar pulmonary arteries for removal of pulmonary emboli using the Penumbra CAT 16 thrombectomy catheter.  4.  Selective catheter placement right middle lobe pulmonary artery and lower lobe pulmonary artery      Anesthesia: Conscious sedation was administered under my direct supervision by the interventional radiology RN. IV Versed plus fentanyl were utilized. Continuous ECG, pulse oximetry and blood pressure was monitored throughout the entire procedure.  Versed and fentanyl were administered intravenously.  Conscious sedation was administered for a total of 69 minutes.  Sheath: 16 French 13 cm Cook sheath right IJ antegrade  Contrast: 85 cc   Fluoroscopy Time: 18.6 minutes.  Estimated blood loss: 450 cc  Indications:  Patient presents with pulmonary emboli. The patient is symptomatic with hypoxemia and dyspnea on exertion.  There is evidence of right heart strain on the CT angiogram. The patient is otherwise a good candidate for intervention and even the long-term benefits pulmonary angiography with thrombolysis is offered. The risks and benefits are reviewed long-term benefits are discussed. All questions are answered patient agrees to proceed.  Procedure:  Ryan [name] a 52 y.o. male who was identified and appropriate procedural time out was performed.  The patient was then placed supine on the table and prepped and draped in the usual sterile fashion.  Ultrasound was used to evaluate the right  internal jugular vein.  It was patent, as it was echolucent and compressible.  A digital ultrasound image was acquired for the permanent record.  A micropuncture needle was used to access the right IJ vein under direct ultrasound guidance.  A microwire was then advanced under fluoroscopic guidance followed by micro-sheath.  A 0.035 J wire was advanced without resistance and a 6Fr sheath was placed.    The wire and pigtail catheter were then negotiated into the right atrium and bolus injection of contrast was utilized to demonstrate the right ventricle and the pulmonary artery outflow.   4000 units of heparin was then given and allowed to circulate.  Using a V S1 catheter with a stiff angled Glidewire I was able to negotiate the wire catheter into the right pulmonary artery and then down into the right lower lobe.  TPA was reconstituted and delivered onto the table. A total of 10 milligrams of TPA was utilized.  All 10 was administered on the right side through an infusion catheter with a 20 cm infusion length. This was then allowed to dwell for 20-30 minutes.  The J-wire and pigtail catheter was advanced up to the right atrium where a bolus injection contrast was used to demonstrate the pulmonary artery outflow.  Stiff angled Glidewire was then exchanged for the J-wire and the pigtail catheter was used to select the pulmonary outflow track.  The right main pulmonary artery was evaluated first.  A select catheter was then advanced over the Amplatz wire into the distal right main pulmonary artery and hand-injection confirmed the thrombus.  As noted above 10 mg of tPA was then injected directly into the thrombus within the right distal main pulmonary artery.  After an appropriate  dwell time the Amplatz wire was reintroduced through the select catheter and the select catheter removed.  The Penumbra Cat 16 extra torque catheter was then advanced into the thrombus in the right lower lobe pulmonary artery.   Hand-injection contrast was used to verify positioning and evaluate the distal anatomy.  Mechanical aspiration was performed using the CAT 16 catheter without a separator.  After multiple passes the catheter was then repositioned into the right middle lobe pulmonary artery and again hand-injection contrast was performed to verify position and evaluate the distal anatomy.  Multiple passes were made using mechanical aspiration in association without a separator.  Hand-injection contrast was then used to give an assessment of the effectiveness of thrombectomy.  After review these images the catheter and sheath were removed and pressure held. There were no immediate complications.    Findings:   Right heart imaging:  Right atrium and right ventricle and the main pulmonary outflow tract appears normal  Right lung: The initial images of the right lung demonstrate near occlusive thrombus within the entire length of the right main pulmonary artery extending into the right upper, middle and lower lobar arteries.  There is thrombus extending into the segmental branches as well.  Following thrombectomy there appears to be marked improvement with more rapid flow.  As expected there is a certain amount of residual thrombus noted given the findings on his multiple CTs and the chronic nature of some of the thrombus within the right main pulmonary artery.  Given the more isolated thrombus on the left and the degree of blood loss entailed with the CAT 16 thrombectomy I elected to terminate the procedure at this point.  At the end of the procedure the patient was satting 99%.  Image of the thrombotic material was saved for the chart    Disposition: Patient was taken to the recovery room in stable condition having tolerated the procedure well.  Ryan [name] 05/17/2023,7:10 PM

## 2023-05-17 NOTE — ED Notes (Signed)
Pt sleeping. 

## 2023-05-17 NOTE — ED Notes (Signed)
Echo at bedside

## 2023-05-17 NOTE — Progress Notes (Signed)
*  PRELIMINARY RESULTS* Echocardiogram 2D Echocardiogram has been performed.  Ryan [name] 05/17/2023, 11:00 AM

## 2023-05-17 NOTE — ED Notes (Signed)
MD at bedside. 

## 2023-05-17 NOTE — ED Notes (Signed)
Pt sitting up to side of bed using urinal

## 2023-05-17 NOTE — Progress Notes (Signed)
Progress Note    Ryan [name]  DGL:[id number] DOB: 1971/07/25  DOA: 05/16/2023 PCP: [name], NP      Brief Narrative:    Medical records reviewed and are as summarized below:  Ryan [name] is a 52 y.o. male with history of morbid obesity, history of PE and DVT on warfarin, patient is status post 2 IVC filter placement, CKD stage IIIa, history of hemodialysis secondary to contrast nephropathy 8 years ago, history of pulmonary embolism thrombectomy in April 2024, chronic lymphedema, chronic hypoxemic respiratory failure on nocturnal oxygen at home, no diagnosis of sleep apnea and does not use CPAP at home per patient report.  He presented to the hospital because of elevated heart rate, shortness of breath and decreased oxygen saturation.  He said his oxygen saturation was 86% on room air and his heart rate was in the 120s.  These findings were similar to what he experienced when he had pulmonary embolism in the past.  He takes warfarin but he said he had not taken his warfarin since Wednesday, 05/11/2023, because he ran out off his warfarin and he could not get it refilled in time.   He was found to have acute pulmonary embolism, acute hypoxemic respiratory failure and acute kidney injury.     Assessment/Plan:   Principal Problem:   Pulmonary emboli (HCC) Active Problems:   Acute respiratory failure with hypoxia (HCC)   AKI (acute kidney injury) (HCC)   History of pulmonary embolism and DVT   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)   Essential hypertension   CKD (chronic kidney disease), stage IIIa   Morbid obesity with BMI of 50.0-59.9, adult (HCC)   Hepatic steatosis   Hyperlipidemia associated with type 2 diabetes mellitus (HCC)   Physical deconditioning   At risk for sleep apnea   Lymphadenopathy   Anticoagulation goal of INR 2 to 3   Bilateral pulmonary embolism (HCC)   Cor pulmonale (chronic) (HCC)    Body mass index is 50.9 kg/m.   (Morbid obesity)    Acute pulmonary embolism in a patient with history of DVT and pulmonary embolism: Continue IV heparin drip and monitor heparin level per protocol.  Follow-up with vascular surgeon for further recommendations.  He was taking warfarin at home but had defaulted treatment because he ran out of warfarin.  INR was 1.2 on admission.   Acute on chronic hypoxemic respiratory failure: He only uses 2 L/min oxygen at night but does not use any oxygen during the day.  He is currently on 2 L/min oxygen via [place].  Taper off oxygen as able.   AKI: Creatinine is trending upward (from 1.77-1.84).  Continue IV fluids with Ringer's lactate infusion.  Monitor BMP closely.  Lasix and Entresto have been held. Hyponatremia: Continue IV fluids  Morbid obesity: This complicates overall care and prognosis.  He is at high risk for OSA and has been advised to follow-up with the pulmonologist audiologist for formal sleep testing.   Other comorbidities include cor pulmonale, morbid obesity, chronic left leg wound.    Diet Order             Diet NPO time specified Except for: Sips with Meds  Diet effective midnight                            Consultants: Vascular surgeon  Procedures: None    Medications:    metoprolol succinate  25 mg Oral Daily   Continuous Infusions:  heparin 1,700 Units/hr (05/16/23 1344)   lactated ringers 75 mL/hr at 05/17/23 0934     Anti-infectives (From admission, onward)    None              Family Communication/Anticipated D/C date and plan/Code Status   DVT prophylaxis: Place TED hose Start: 05/16/23 1304     Code Status: Full Code  Family Communication: None Disposition Plan: Plan to discharge home in 2 to 3 days   Status is: Inpatient Remains inpatient appropriate because: Acute pulmonary embolism       Subjective:   Interval events noted.  He feels a little better.  No chest pain or shortness of  breath.  Objective:    Vitals:   05/17/23 0641 05/17/23 0800 05/17/23 0915 05/17/23 0932  BP: 107/73   138/87  Pulse: 74 73 78   Resp: 17 (!) 22 (!) 25   Temp:      TempSrc:      SpO2: 93% 92% 96%   Weight:      Height:       No data found.  No intake or output data in the 24 hours ending 05/17/23 1246 Filed Weights   05/16/23 0906  Weight: (!) 147.4 kg    Exam:  GEN: NAD SKIN: Warm and dry.  Chronic wounds on the left leg EYES: No pallor or icterus ENT: MMM CV: RRR PULM: CTA B ABD: soft, obese, NT, +BS CNS: AAO x 3, non focal EXT: No edema or tenderness       Data Reviewed:   I have personally reviewed following labs and imaging studies:  Labs: Labs show the following:   Basic Metabolic Panel: Recent Labs  Lab 05/16/23 0911 05/17/23 0419  NA 132* 132*  K 3.8 3.9  CL 101 101  CO2 20* 21*  GLUCOSE 113* 114*  BUN 19 22*  CREATININE 1.77* 1.84*  CALCIUM 8.5* 8.1*   GFR Estimated Creatinine Clearance: 64.8 mL/min (A) (by C-G formula based on SCr of 1.84 mg/dL (H)). Liver Function Tests: No results for input(s): "AST", "ALT", "ALKPHOS", "BILITOT", "PROT", "ALBUMIN" in the last 168 hours. No results for input(s): "LIPASE", "AMYLASE" in the last 168 hours. No results for input(s): "AMMONIA" in the last 168 hours. Coagulation profile Recent Labs  Lab 05/16/23 0958 05/17/23 0419  INR 1.2 1.3*    CBC: Recent Labs  Lab 05/16/23 0911 05/17/23 0419  WBC 4.4 5.2  HGB 16.8 15.8  HCT 49.7 47.6  MCV 89.4 90.2  PLT 163 164   Cardiac Enzymes: No results for input(s): "CKTOTAL", "CKMB", "CKMBINDEX", "TROPONINI" in the last 168 hours. BNP (last 3 results) No results for input(s): "PROBNP" in the last 8760 hours. CBG: No results for input(s): "GLUCAP" in the last 168 hours. D-Dimer: No results for input(s): "DDIMER" in the last 72 hours. Hgb A1c: No results for input(s): "HGBA1C" in the last 72 hours. Lipid Profile: No results for input(s):  "CHOL", "HDL", "LDLCALC", "TRIG", "CHOLHDL", "LDLDIRECT" in the last 72 hours. Thyroid function studies: No results for input(s): "TSH", "T4TOTAL", "T3FREE", "THYROIDAB" in the last 72 hours.  Invalid input(s): "FREET3" Anemia work up: No results for input(s): "VITAMINB12", "FOLATE", "FERRITIN", "TIBC", "IRON", "RETICCTPCT" in the last 72 hours. Sepsis Labs: Recent Labs  Lab 05/16/23 0911 05/17/23 0419  WBC 4.4 5.2    Microbiology No results found for this or any previous visit (from the past 240 hour(s)).  Procedures and diagnostic studies:  CT Angio Chest PE W and/or Wo Contrast  Result Date: 05/16/2023 CLINICAL DATA:  Pulmonary embolism (PE) suspected, high prob. Shortness of breath. Fatigue. Chills. EXAM: CT ANGIOGRAPHY CHEST WITH CONTRAST TECHNIQUE: Multidetector CT imaging of the chest was performed using the standard protocol during bolus administration of intravenous contrast. Multiplanar CT image reconstructions and MIPs were obtained to evaluate the vascular anatomy. RADIATION DOSE REDUCTION: This exam was performed according to the departmental dose-optimization program which includes automated exposure control, adjustment of the mA and/or kV according to patient size and/or use of iterative reconstruction technique. CONTRAST:  75mL OMNIPAQUE IOHEXOL 350 MG/ML SOLN COMPARISON:  CT angiography chest from 12/13/2022. FINDINGS: Cardiovascular: Redemonstration of large clot in the main right pulmonary artery, essentially similar to the prior study. There is extension of the clot into the right upper lobe lobar pulmonary artery, that is also unchanged. However, there is interval increase in the peripheral nonocclusive thrombus in the left lung lower lobe lobar pulmonary artery when compared to the prior exam. It is unclear whether this is acute or subacute, on the basis of this exam. There are additional, stable, several linear web like filling defects in the bilateral lower lobe  pulmonary artery branches, compatible with chronic emboli. Redemonstration of compressed left ventricle, compatible with right heart failure/strain. There is dilation of the main pulmonary trunk measuring up to 3.7 cm, which is nonspecific but can be seen with pulmonary artery hypertension. Normal cardiac size. No pericardial effusion. No aortic aneurysm. Mediastinum/Nodes: Visualized thyroid gland appears grossly unremarkable. No solid / cystic mediastinal masses. The esophagus is nondistended precluding optimal assessment. No axillary, mediastinal or hilar lymphadenopathy by size criteria. Lungs/Pleura: The central tracheo-bronchial tree is patent. No mass or consolidation. No pleural effusion or pneumothorax. No suspicious lung nodules. Upper Abdomen: Visualized upper abdominal viscera within normal limits. Suprarenal IVC filter again noted. Musculoskeletal: Redemonstration of multiple dilated venous collaterals along the anterior chest wall. The visualized soft tissues of the chest wall are otherwise grossly unremarkable. No suspicious osseous lesions. There are mild multilevel degenerative changes in the visualized spine. Review of the MIP images confirms the above findings. IMPRESSION: 1. Redemonstration of large clot in the main right pulmonary artery, essentially similar to the prior study. There is extension of the clot into the right upper lobe lobar pulmonary artery, that is also unchanged. However, there is interval increase in the peripheral nonocclusive thrombus in the left lung lower lobe lobar pulmonary artery when compared to the prior exam. 2. Redemonstration of compressed left ventricle, compatible with right heart failure/strain. 3. Dilation of the main pulmonary trunk measuring up to 3.7 cm, which is nonspecific but can be seen with pulmonary artery hypertension. 4. Multiple other nonacute observations, as described above. Electronically Signed   By: Jules Schick M.D.   On: 05/16/2023 12:18    DG Chest 2 View  Result Date: 05/16/2023 CLINICAL DATA:  Shortness of breath for several days. Chills. Fatigue. Hypoxia. EXAM: CHEST - 2 VIEW COMPARISON:  03/07/2023 FINDINGS: The heart size and mediastinal contours are within normal limits. Both lungs are clear. The visualized skeletal structures are unremarkable. IMPRESSION: No active cardiopulmonary disease. Electronically Signed   By: Danae Orleans M.D.   On: 05/16/2023 09:40               LOS: 1 day   Jailine Lieder  [facility]   Pager on [email or web address]. If 7PM-7AM, please contact night-coverage at [email or web address]     05/17/2023, 12:46 PM

## 2023-05-17 NOTE — Consult Note (Signed)
Pharmacy Consult Note - Anticoagulation  Pharmacy Consult for heparin Indication: pulmonary embolus  PATIENT MEASUREMENTS: Height: 5\' 7"  (170.2 cm) Weight: (!) 147.4 kg (325 lb) IBW/kg (Calculated) : 66.1 HEPARIN DW (KG): 102.1  VITAL SIGNS: Temp: 98.6 F (37 C) (09/17 0430) Temp Source: Oral (09/17 0430) BP: 97/66 (09/17 0400) Pulse Rate: 80 (09/17 0400)  Recent Labs    05/16/23 0958 05/16/23 2000 05/17/23 0419  HGB  --   --  15.8  HCT  --   --  47.6  PLT  --   --  164  APTT 29  --   --   LABPROT 15.6*  --  16.6*  INR 1.2  --  1.3*  HEPARINUNFRC  --    < > 0.37  CREATININE  --   --  1.84*   < > = values in this interval not displayed.    Estimated Creatinine Clearance: 64.8 mL/min (A) (by C-G formula based on SCr of 1.84 mg/dL (H)).  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  Regular the workup has not been forthcoming) previously followed by Dr. Isaiah Serge at [facility]   Hepatic steatosis    Hypertension    Morbid obesity with BMI of 50.0-59.9, adult [facility])    Peripheral vascular disease (HCC)    Presence of IVC filter    Has 2 IVC filters with significant thrombus burden superior to the filter.   Pulmonary embolus (HCC) 12/2016   Initially just treated with anticoagulation, but in February 2023 in April 2024 treated with EKOS thrombectomy for submassive PE.   Ventricular trigeminy    Weakness of both lower limbs     ASSESSMENT: 52 y.o. male with PMH chronic PE s/p IVC filter x 2 in 2005 and 2012 (on warfarin, INR goal 2-3), diabetes, HTN is presenting with shortness of breath, attributed to pulmonary embolism. CT chest demonstrates large  clot in right pulmonary artery with extension into RUL lobar pulmonary artery, essentially unchanged from prior study on 12/13/2022. Additionally, interval increase in peripheral nonocclusive thrombus in LLL lobar pulmonary artery. Imaging also shows compressed LV, consistent with right heart failure/strain. Patient is on chronic anticoagulation per chart review. Pharmacy has been consulted to initiate and manage heparin intravenous infusion.  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  Pertinent medications: PTA Warfarin 5 mg daily  Goal(s) of therapy: Heparin level 0.3 - 0.7 units/mL INR 2 - 3 Monitor platelets by anticoagulation protocol: Yes   Baseline anticoagulation labs: Recent Labs    05/16/23 0911 05/16/23 0958 05/17/23 0419  APTT  --  29  --   INR  --  1.2 1.3*  HGB 16.8  --  15.8  PLT 163  --  164   Warfarin Date INR Warfarin Dose 9/16 1.2 Held due to heparin drip  Heparin Date Time aPTT/HL Rate/Comment    PLAN: Heparin level therapeutic x 2 continue heparin infusion at 1700 units/hour. recheck HL daily w/ AM labs while therapeutic. Monitor CBC daily while on heparin infusion.  Otelia Sergeant, PharmD, [facility] 05/17/2023 5:15 AM

## 2023-05-18 ENCOUNTER — Encounter: Payer: Self-pay | Admitting: Vascular Surgery

## 2023-05-18 DIAGNOSIS — I2699 Other pulmonary embolism without acute cor pulmonale: Secondary | ICD-10-CM | POA: Diagnosis not present

## 2023-05-18 LAB — HEPARIN LEVEL (UNFRACTIONATED)
Heparin Unfractionated: 0.38 [IU]/mL (ref 0.30–0.70)
Heparin Unfractionated: 0.36 [IU]/mL (ref 0.30–0.70)

## 2023-05-18 MED ORDER — WARFARIN SODIUM 7.5 MG PO TABS
7.5000 mg | ORAL_TABLET | Freq: Once | ORAL | Status: AC
  Administered 2023-05-18: 7.5 mg via ORAL
  Filled 2023-05-18: qty 1

## 2023-05-18 MED ORDER — HEPARIN BOLUS VIA INFUSION
3000.0000 [IU] | Freq: Once | INTRAVENOUS | Status: AC
  Administered 2023-05-18: 3000 [IU] via INTRAVENOUS
  Filled 2023-05-18: qty 3000

## 2023-05-18 MED ORDER — WARFARIN - PHARMACIST DOSING INPATIENT: Freq: Every day | Status: DC

## 2023-05-18 MED ORDER — ORAL CARE MOUTH RINSE: 15.0000 mL | OROMUCOSAL | Status: DC | PRN

## 2023-05-18 MED ORDER — IODIXANOL 320 MG/ML IV SOLN
INTRAVENOUS | Status: DC | PRN
  Administered 2023-05-17: 85 mL

## 2023-05-18 MED ORDER — LIDOCAINE HCL (PF) 1 % IJ SOLN
INTRAMUSCULAR | Status: DC | PRN
  Administered 2023-05-17: 10 mL

## 2023-05-18 MED ORDER — HEPARIN (PORCINE) IN NACL 2000-0.9 UNIT/L-% IV SOLN
INTRAVENOUS | Status: DC | PRN
  Administered 2023-05-17: 1000 mL

## 2023-05-18 NOTE — Consult Note (Signed)
ANTICOAGULATION CONSULT NOTE   Pharmacy Consult for warfarin + heparin bridge Indication:  history of PE and DVT with a new PE.   Allergies  Allergen Reactions   Metrizamide Other (See Comments)    Kidney failure requiring dialysis Kidney failure requiring dialysis    Clindamycin/Lincomycin Rash   Lincomycin Rash   Sulfa Antibiotics Rash    Patient Measurements: Height: 5\' 7"  (170.2 cm) Weight: (!) 147.4 kg (325 lb) IBW/kg (Calculated) : 66.1 Heparin Dosing Weight: 102.1 kg   Vital Signs: Temp: 98.3 F (36.8 C) (09/18 1118) Temp Source: Oral (09/18 1118) BP: 105/73 (09/18 1118) Pulse Rate: 93 (09/18 1118)  Labs: Recent Labs    05/16/23 0911 05/16/23 0958 05/16/23 2000 05/17/23 0419 05/18/23 0157 05/18/23 1004 05/18/23 1542  HGB 16.8  --   --  15.8 14.3  --   --   HCT 49.7  --   --  47.6 43.4  --   --   PLT 163  --   --  164 145*  --   --   APTT  --  29  --   --   --   --   --   LABPROT  --  15.6*  --  16.6*  --   --   --   INR  --  1.2  --  1.3*  --   --   --   HEPARINUNFRC  --   --    < > 0.37 0.13* 0.38 0.36  CREATININE 1.77*  --   --  1.84* 1.43*  --   --    < > = values in this interval not displayed.    Estimated Creatinine Clearance: 83.3 mL/min (A) (by C-G formula based on SCr of 1.43 mg/dL (H)).   Medical History: Past Medical History:  Diagnosis Date   (HFpEF) heart failure with preserved ejection fraction (HCC)    Arrhythmia    atrial fibrillation   Diabetes mellitus type II, non insulin dependent (HCC)    Diabetes mellitus without complication (HCC)    per pt-pre diabetic-Dr Dario Guardian stated said pt   DVT (deep venous thrombosis) (HCC)    Over 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  Regular the workup has not been forthcoming) previously followed by Dr. Isaiah Serge at [facility]   Hepatic steatosis    Hypertension    Morbid obesity with BMI of 50.0-59.9, adult [facility])    Peripheral vascular disease (HCC)    Presence of IVC filter    Has 2 IVC filters with significant thrombus  burden superior to the filter.   Pulmonary embolus (HCC) 12/2016   Initially just treated with anticoagulation, but in February 2023 in April 2024 treated with EKOS thrombectomy for submassive PE.   Ventricular trigeminy    Weakness of both lower limbs     Medications:  Medications Prior to Admission  Medication Sig Dispense Refill Last Dose   albuterol (VENTOLIN HFA) 108 (90 Base) MCG/ACT inhaler Inhale 2 puffs into the lungs every 6 (six) hours as needed for wheezing or shortness of breath. 8 g 2 prn at unk   magnesium oxide (MAG-OX) 400 (240 Mg) MG tablet Take 400 mg by mouth daily.   Past Month   metoprolol succinate (TOPROL-XL) 25 MG 24 hr tablet Take 1 tablet (25 mg total) by mouth daily.   05/16/2023   sacubitril-valsartan (ENTRESTO) 24-26 MG Take 1 tablet by mouth 2 (two) times daily. 60 tablet 3 05/16/2023   furosemide (LASIX) 40 MG tablet Take 1 tablet (  40 mg total) by mouth every other day. 30 tablet 0 05/12/2023   OXYGEN Inhale 1 L into the lungs daily.      tirzepatide [facility]) 5 MG/0.5ML Pen Inject 5 mg into the skin once a week. 6 mL 0 05/09/2023   warfarin (COUMADIN) 5 MG tablet Take 1 tablet (5 mg total) by mouth daily. 90 tablet 1 05/11/2023   Scheduled:   metoprolol succinate  25 mg Oral Daily   warfarin  7.5 mg Oral ONCE-1600   Warfarin - Pharmacist Dosing Inpatient   Does not apply q1600   Infusions:   heparin 2,000 Units/hr (05/18/23 1120)   PRN: acetaminophen **OR** acetaminophen, albuterol, Heparin (Porcine) in NaCl, iodixanol, lidocaine (PF), ondansetron **OR** ondansetron (ZOFRAN) IV, mouth rinse, senna-docusate Anti-infectives (From admission, onward)    Start     Dose/Rate Route Frequency Ordered Stop   05/17/23 1655  ceFAZolin (ANCEF) IVPB 2g/100 mL premix  Status:  Discontinued        2 g 200 mL/hr over 30 Minutes Intravenous 30 min pre-op 05/17/23 1655 05/17/23 1910       Assessment: 52 y.o. male who presents from wound care clinic with shortness of  breath requiring 2 liters on nasal cannula oxygen at all times with oxygen saturations of 90% while on oxygen.  Upon workup patient was found to have a pulmonary embolism in the right main pulmonary artery which she has had prior.  There is an extension of the clot to the right upper lobe.  There is also interval increase in the peripheral nonocclusive thrombus in the left lower lung pulmonary artery when compared to prior exam.  He is also noted to have a compressed left ventricle compatible with right heart failure/strain.  He also has dilatation of the main pulmonary trunk which is nonspecific but can be seen with pulmonary artery hypertension. Vascular surgery consulted and s/p thrombectomy. Patient is on warfarin 5 mg daily at home but on presentation pt's INR was subtherapeutic. No major DDI. 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 Limited data with DOACs.    0918 0157 HL 0.13  9/18 1004 HL 0.38  9/18 1542 HL 0.36    Date INR Warfarin Dose  9/18 1.3 7.5 mg          Goal of Therapy:  INR 2-3 Monitor platelets by anticoagulation protocol: Yes   Plan:  Heparin:  Heparin level is therapeutic. Will continue heparin infusion at 2000 units/hr. Recheck heparin level with AM labs. CBC daily while on heparin.   Warfarin:  INR is subtherapeutic. Will give warfarin 7.5 mg x 1 tonight (~50% increase from home dose), to help get INR therapeutic. Daily INR. CBC daily while on heparin. Stop heparin infusion once INR > 2.   Recommend possibly aiming for an INR goal of 2.5 -3.    Sharen Hones, PharmD, BCPS Clinical Pharmacist   05/18/2023,4:09 PM

## 2023-05-18 NOTE — Consult Note (Signed)
ANTICOAGULATION CONSULT NOTE   Pharmacy Consult for warfarin + heparin bridge Indication:  history of PE and DVT with a new PE.   Allergies  Allergen Reactions   Metrizamide Other (See Comments)    Kidney failure requiring dialysis Kidney failure requiring dialysis    Clindamycin/Lincomycin Rash   Lincomycin Rash   Sulfa Antibiotics Rash    Patient Measurements: Height: 5\' 7"  (170.2 cm) Weight: (!) 147.4 kg (325 lb) IBW/kg (Calculated) : 66.1 Heparin Dosing Weight: 102.1 kg   Vital Signs: Temp: 98.2 F (36.8 C) (09/18 0759) Temp Source: Oral (09/18 0759) BP: 112/79 (09/18 0759) Pulse Rate: 85 (09/18 0759)  Labs: Recent Labs    05/16/23 0911 05/16/23 0958 05/16/23 2000 05/17/23 0419 05/18/23 0157  HGB 16.8  --   --  15.8 14.3  HCT 49.7  --   --  47.6 43.4  PLT 163  --   --  164 145*  APTT  --  29  --   --   --   LABPROT  --  15.6*  --  16.6*  --   INR  --  1.2  --  1.3*  --   HEPARINUNFRC  --   --  0.50 0.37 0.13*  CREATININE 1.77*  --   --  1.84* 1.43*    Estimated Creatinine Clearance: 83.3 mL/min (A) (by C-G formula based on SCr of 1.43 mg/dL (H)).   Medical History: Past Medical History:  Diagnosis Date   (HFpEF) heart failure with preserved ejection fraction (HCC)    Arrhythmia    atrial fibrillation   Diabetes mellitus type II, non insulin dependent (HCC)    Diabetes mellitus without complication (HCC)    per pt-pre diabetic-Dr Dario Guardian stated said pt   DVT (deep venous thrombosis) (HCC)    Over 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  Regular the workup has not been forthcoming) previously [facility]   Hepatic steatosis    Hypertension    Morbid obesity with BMI of 50.0-59.9, adult [facility])    Peripheral vascular disease (HCC)    Presence of IVC filter    Has 2 IVC filters with significant thrombus burden superior to the filter.   Pulmonary embolus (HCC) 12/2016   Initially just treated with anticoagulation, but in February 2023 in April 2024 treated  with EKOS thrombectomy for submassive PE.   Ventricular trigeminy    Weakness of both lower limbs     Medications:  Medications Prior to Admission  Medication Sig Dispense Refill Last Dose   albuterol (VENTOLIN HFA) 108 (90 Base) MCG/ACT inhaler Inhale 2 puffs into the lungs every 6 (six) hours as needed for wheezing or shortness of breath. 8 g 2 prn at unk   magnesium oxide (MAG-OX) 400 (240 Mg) MG tablet Take 400 mg by mouth daily.   Past Month   metoprolol succinate (TOPROL-XL) 25 MG 24 hr tablet Take 1 tablet (25 mg total) by mouth daily.   05/16/2023   sacubitril-valsartan (ENTRESTO) 24-26 MG Take 1 tablet by mouth 2 (two) times daily. 60 tablet 3 05/16/2023   furosemide (LASIX) 40 MG tablet Take 1 tablet (40 mg total) by mouth every other day. 30 tablet 0 05/12/2023   OXYGEN Inhale 1 L into the lungs daily.      tirzepatide [facility]) 5 MG/0.5ML Pen Inject 5 mg into the skin once a week. 6 mL 0 05/09/2023   warfarin (COUMADIN) 5 MG tablet Take 1 tablet (5 mg total) by mouth daily. 90  tablet 1 05/11/2023   Scheduled:   metoprolol succinate  25 mg Oral Daily   warfarin  7.5 mg Oral ONCE-1600   Warfarin - Pharmacist Dosing Inpatient   Does not apply q1600   Infusions:   heparin 2,000 Units/hr (05/18/23 0800)   PRN: acetaminophen **OR** acetaminophen, albuterol, Heparin (Porcine) in NaCl, iodixanol, lidocaine (PF), ondansetron **OR** ondansetron (ZOFRAN) IV, senna-docusate Anti-infectives (From admission, onward)    Start     Dose/Rate Route Frequency Ordered Stop   05/17/23 1655  ceFAZolin (ANCEF) IVPB 2g/100 mL premix  Status:  Discontinued        2 g 200 mL/hr over 30 Minutes Intravenous 30 min pre-op 05/17/23 1655 05/17/23 1910       Assessment: 52 y.o. male who presents from wound care clinic with shortness of breath requiring 2 liters on nasal cannula oxygen at all times with oxygen saturations of 90% while on oxygen.  Upon workup patient was found to have a pulmonary embolism  in the right main pulmonary artery which she has had prior.  There is an extension of the clot to the right upper lobe.  There is also interval increase in the peripheral nonocclusive thrombus in the left lower lung pulmonary artery when compared to prior exam.  He is also noted to have a compressed left ventricle compatible with right heart failure/strain.  He also has dilatation of the main pulmonary trunk which is nonspecific but can be seen with pulmonary artery hypertension. Vascular surgery consulted and s/p thrombectomy. Patient is on warfarin 5 mg daily at home but on presentation pt's INR was subtherapeutic. No major DDI. 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 Limited data with DOACs.    0918 0157 HL 0.13  9/18 1004 HL 0.38     Date INR Warfarin Dose  9/18 1.3 7.5 mg          Goal of Therapy:  INR 2-3 Monitor platelets by anticoagulation protocol: Yes   Plan:  Heparin:  Heparin level is therapeutic. Will continue heparin infusion at 2000 units/hr. Recheck heparin level in 6 hours. CBC daily while on heparin.   Warfarin:  INR is subtherapeutic. Will give warfarin 7.5 mg x 1 tonight (~50% increase from home dose), to help get INR therapeutic. Daily INR. CBC daily while on heparin. Stop heparin infusion once INR > 2.   Recommend possibly aiming for an INR goal of 2.5 -3.    Ronnald Ramp, PharmD, BCPS 05/18/2023,9:00 AM

## 2023-05-18 NOTE — Progress Notes (Signed)
PROGRESS NOTE    Ryan [name]  NUU:[id number] DOB: Nov 30, 1970 DOA: 05/16/2023 PCP: [name], NP    Brief Narrative:   Ryan [name] is a 52 y.o. male with history of morbid obesity, history of PE and DVT on warfarin, patient is status post 2 IVC filter placement, CKD stage IIIa, history of hemodialysis secondary to contrast nephropathy 8 years ago, history of pulmonary embolism thrombectomy in April 2024, chronic lymphedema, chronic hypoxemic respiratory failure on nocturnal oxygen at home, no diagnosis of sleep apnea and does not use CPAP at home per patient report.  He presented to the hospital because of elevated heart rate, shortness of breath and decreased oxygen saturation.  He said his oxygen saturation was 86% on room air and his heart rate was in the 120s.  These findings were similar to what he experienced when he had pulmonary embolism in the past.  He takes warfarin but he said he had not taken his warfarin since Wednesday, 05/11/2023, because he ran out off his warfarin and he could not get it refilled in time.     He was found to have acute pulmonary embolism, acute hypoxemic respiratory failure and acute kidney injury.   Assessment & Plan:   Principal Problem:   Pulmonary emboli (HCC) Active Problems:   Acute respiratory failure with hypoxia (HCC)   AKI (acute kidney injury) (HCC)   History of pulmonary embolism and DVT   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)   Essential hypertension   CKD (chronic kidney disease), stage IIIa   Morbid obesity with BMI of 50.0-59.9, adult (HCC)   Hepatic steatosis   Hyperlipidemia associated with type 2 diabetes mellitus (HCC)   Physical deconditioning   At risk for sleep apnea   Lymphadenopathy   Anticoagulation goal of INR 2 to 3   Bilateral pulmonary embolism (HCC)   Cor pulmonale (chronic) (HCC)  Acute pulmonary embolism in a patient with history of DVT and pulmonary embolism:  Status post  mechanical thrombectomy with vascular surgery on 9/17.  Tolerated procedure well.  On room air.  Discussed anticoagulation strategy with vascular surgery and hematology.  Patient has previously failed DOAC's so warfarin is recommended at this time. Plan: Restart warfarin with heparin bridge.  Appreciate pharmacy dosing assistance.  Daily INRs.  Will need to keep patient admitted until INR therapeutic.  Goal INR 2-3.  Closer to 3.  Acute on chronic hypoxemic respiratory failure: He only uses 2 L/min oxygen at night but does not use any oxygen during the day.  He is currently on 2 L/min oxygen via [place].  Wean off oxygen as tolerated     AKI: Renal function has approached baseline.  Discontinue IV fluids.  Continue to hold Entresto and Lasix for now.  Restart as appropriate.    Hyponatremia: Mild and asymptomatic   Morbid obesity: This complicates overall care and prognosis.  He is at high risk for OSA and has been advised to follow-up with the pulmonologist for formal sleep testing.     Other comorbidities include cor pulmonale, morbid obesity, chronic left leg wound.   DVT prophylaxis: IV heparin Code Status: Full Family Communication: None Disposition Plan: Status is: Inpatient Remains inpatient appropriate because: Recurrent pulmonary with some status post mechanical thrombectomy.  On anticoagulation Coumadin with heparin bridge.   Level of care: Progressive  Consultants:  Vascular surgery  Procedures:  Pulmonary thrombectomy  Antimicrobials: None   Subjective: Seen and examined resting bed.  No visible  distress  Objective: Vitals:   05/18/23 0003 05/18/23 0259 05/18/23 0759 05/18/23 1118  BP: 114/81 105/69 112/79 105/73  Pulse: 80 86 85 93  Resp: 20 18 (!) 22 (!) 23  Temp: 99.6 F (37.6 C) 97.7 F (36.5 C) 98.2 F (36.8 C) 98.3 F (36.8 C)  TempSrc:   Oral Oral  SpO2: 92% 94% 93% 96%  Weight:      Height:        Intake/Output Summary (Last 24 hours) at  05/18/2023 1539 Last data filed at 05/18/2023 1100 Gross per 24 hour  Intake 2539.43 ml  Output 650 ml  Net 1889.43 ml   Filed Weights   05/16/23 0906  Weight: (!) 147.4 kg    Examination:  General exam: Appears calm and comfortable  Respiratory system: Lung sounds diminished bases.  Otherwise clear.  Normal work of breathing.  2 L Cardiovascular system: S1-S2 RRR, no murmurs, no pedal edema Gastrointestinal system: Obese, soft, NT/ND, normal bowel sounds Central nervous system: Alert and oriented. No focal neurological deficits. Extremities: Symmetric 5 x 5 power. Skin: No rashes, lesions or ulcers Psychiatry: Judgement and insight appear normal. Mood & affect appropriate.     Data Reviewed: I have personally reviewed following labs and imaging studies  CBC: Recent Labs  Lab 05/16/23 0911 05/17/23 0419 05/18/23 0157  WBC 4.4 5.2 5.0  HGB 16.8 15.8 14.3  HCT 49.7 47.6 43.4  MCV 89.4 90.2 91.6  PLT 163 164 145*   Basic Metabolic Panel: Recent Labs  Lab 05/16/23 0911 05/17/23 0419 05/18/23 0157  NA 132* 132* 134*  K 3.8 3.9 3.8  CL 101 101 103  CO2 20* 21* 21*  GLUCOSE 113* 114* 118*  BUN 19 22* 24*  CREATININE 1.77* 1.84* 1.43*  CALCIUM 8.5* 8.1* 8.0*   GFR: Estimated Creatinine Clearance: 83.3 mL/min (A) (by C-G formula based on SCr of 1.43 mg/dL (H)). Liver Function Tests: No results for input(s): "AST", "ALT", "ALKPHOS", "BILITOT", "PROT", "ALBUMIN" in the last 168 hours. No results for input(s): "LIPASE", "AMYLASE" in the last 168 hours. No results for input(s): "AMMONIA" in the last 168 hours. Coagulation Profile: Recent Labs  Lab 05/16/23 0958 05/17/23 0419  INR 1.2 1.3*   Cardiac Enzymes: No results for input(s): "CKTOTAL", "CKMB", "CKMBINDEX", "TROPONINI" in the last 168 hours. BNP (last 3 results) No results for input(s): "PROBNP" in the last 8760 hours. HbA1C: No results for input(s): "HGBA1C" in the last 72 hours. CBG: No results for  input(s): "GLUCAP" in the last 168 hours. Lipid Profile: No results for input(s): "CHOL", "HDL", "LDLCALC", "TRIG", "CHOLHDL", "LDLDIRECT" in the last 72 hours. Thyroid Function Tests: No results for input(s): "TSH", "T4TOTAL", "FREET4", "T3FREE", "THYROIDAB" in the last 72 hours. Anemia Panel: No results for input(s): "VITAMINB12", "FOLATE", "FERRITIN", "TIBC", "IRON", "RETICCTPCT" in the last 72 hours. Sepsis Labs: No results for input(s): "PROCALCITON", "LATICACIDVEN" in the last 168 hours.  No results found for this or any previous visit (from the past 240 hour(s)).       Radiology Studies: PERIPHERAL VASCULAR CATHETERIZATION  Result Date: 05/17/2023 See surgical note for result.  ECHOCARDIOGRAM COMPLETE  Result Date: 05/17/2023    ECHOCARDIOGRAM REPORT   Patient Name:   Ryan [name] Date of Exam: 05/17/2023 Medical Rec #:  [id number]     Height:       67.0 in Accession #:    [id number]    Weight:       325.0 lb Date of Birth:  11/15/1970  BSA:          2.486 m Patient Age:    52 years      BP:           107/73 mmHg Patient Gender: M             HR:           73 bpm. Exam Location:  [facility] Procedure: 2D Echo, Cardiac Doppler and Color Doppler Indications:     Pulmonary embolus  History:         Patient has prior history of Echocardiogram examinations, most                  recent 04/08/2022. CHF, Angina, Arrythmias:Atrial Fibrillation,                  Atrial Flutter and PVC, Signs/Symptoms:Fatigue; Risk                  Factors:Hypertension, Diabetes and Dyslipidemia. Pulmonary                  embolus, CKD.  Sonographer:     Mikki Harbor Referring Phys:  [id number] AMY N COX Diagnosing Phys: Lorine Bears MD  Sonographer Comments: Technically difficult study due to poor echo windows and patient is obese. IMPRESSIONS  1. Left ventricular ejection fraction, by estimation, is 60 to 65%. The left ventricle has normal function. The left ventricle has no regional wall motion abnormalities.  There is mild asymmetric left ventricular hypertrophy of the basal-septal segment. Left ventricular diastolic parameters were normal. There is the interventricular septum is flattened in systole, consistent with right ventricular pressure overload.  2. Right ventricular systolic function is moderately reduced. The right ventricular size is moderately enlarged. Tricuspid regurgitation signal is inadequate for assessing PA pressure.  3. Right atrial size was moderately dilated.  4. The mitral valve is normal in structure. No evidence of mitral valve regurgitation. No evidence of mitral stenosis.  5. The aortic valve is calcified. Aortic valve regurgitation is trivial. Aortic valve sclerosis/calcification is present, without any evidence of aortic stenosis. FINDINGS  Left Ventricle: Left ventricular ejection fraction, by estimation, is 60 to 65%. The left ventricle has normal function. The left ventricle has no regional wall motion abnormalities. The left ventricular internal cavity size was normal in size. There is  mild asymmetric left ventricular hypertrophy of the basal-septal segment. The interventricular septum is flattened in systole, consistent with right ventricular pressure overload. Left ventricular diastolic parameters were normal. Right Ventricle: The right ventricular size is moderately enlarged. No increase in right ventricular wall thickness. Right ventricular systolic function is moderately reduced. Tricuspid regurgitation signal is inadequate for assessing PA pressure. Left Atrium: Left atrial size was normal in size. Right Atrium: Right atrial size was moderately dilated. Pericardium: There is no evidence of pericardial effusion. Mitral Valve: The mitral valve is normal in structure. No evidence of mitral valve regurgitation. No evidence of mitral valve stenosis. MV peak gradient, 3.1 mmHg. The mean mitral valve gradient is 1.0 mmHg. Tricuspid Valve: The tricuspid valve is normal in structure.  Tricuspid valve regurgitation is trivial. No evidence of tricuspid stenosis. Aortic Valve: The aortic valve is calcified. Aortic valve regurgitation is trivial. Aortic valve sclerosis/calcification is present, without any evidence of aortic stenosis. Aortic valve mean gradient measures 4.0 mmHg. Aortic valve peak gradient measures 7.8 mmHg. Aortic valve area, by VTI measures 2.31 cm. Pulmonic Valve: The pulmonic valve was normal in structure. Pulmonic valve regurgitation is  mild. No evidence of pulmonic stenosis. Aorta: The aortic root is normal in size and structure. Venous: The inferior vena cava was not well visualized. IAS/Shunts: No atrial level shunt detected by color flow Doppler.  LEFT VENTRICLE PLAX 2D LVIDd:         5.00 cm   Diastology LVIDs:         3.20 cm   LV e' medial:    8.49 cm/s LV PW:         0.80 cm   LV E/e' medial:  7.8 LV IVS:        0.85 cm   LV e' lateral:   15.40 cm/s LVOT diam:     2.00 cm   LV E/e' lateral: 4.3 LV SV:         59 LV SV Index:   24 LVOT Area:     3.14 cm  RIGHT VENTRICLE RV Basal diam:  5.30 cm RV Mid diam:    4.20 cm RV S prime:     13.20 cm/s TAPSE (M-mode): 1.5 cm LEFT ATRIUM             Index        RIGHT ATRIUM           Index LA diam:        3.60 cm 1.45 cm/m   RA Area:     27.00 cm LA Vol (A2C):   34.4 ml 13.84 ml/m  RA Volume:   100.00 ml 40.23 ml/m LA Vol (A4C):   15.4 ml 6.20 ml/m LA Biplane Vol: 23.4 ml 9.41 ml/m  AORTIC VALVE                    PULMONIC VALVE AV Area (Vmax):    2.42 cm     PV Vmax:       0.66 m/s AV Area (Vmean):   2.12 cm     PV Peak grad:  1.8 mmHg AV Area (VTI):     2.31 cm AV Vmax:           140.00 cm/s AV Vmean:          88.300 cm/s AV VTI:            0.254 m AV Peak Grad:      7.8 mmHg AV Mean Grad:      4.0 mmHg LVOT Vmax:         108.00 cm/s LVOT Vmean:        59.600 cm/s LVOT VTI:          0.187 m LVOT/AV VTI ratio: 0.74  AORTA Ao Root diam: 3.20 cm MITRAL VALVE               TRICUSPID VALVE MV Area (PHT): 2.55 cm    TR  Peak grad:   22.1 mmHg MV Area VTI:   2.08 cm    TR Vmax:        235.00 cm/s MV Peak grad:  3.1 mmHg MV Mean grad:  1.0 mmHg    SHUNTS MV Vmax:       0.87 m/s    Systemic VTI:  0.19 m MV Vmean:      53.1 cm/s   Systemic Diam: 2.00 cm MV Decel Time: 297 msec MV E velocity: 66.20 cm/s MV A velocity: 83.40 cm/s MV E/A ratio:  0.79 Lorine Bears MD Electronically signed by Lorine Bears MD Signature Date/Time: 05/17/2023/1:01:14 PM    Final  Scheduled Meds:  metoprolol succinate  25 mg Oral Daily   warfarin  7.5 mg Oral ONCE-1600   Warfarin - Pharmacist Dosing Inpatient   Does not apply q1600   Continuous Infusions:  heparin 2,000 Units/hr (05/18/23 1120)     LOS: 2 days       Tresa Moore, MD [facility]   If 7PM-7AM, please contact night-coverage  05/18/2023, 3:39 PM

## 2023-05-18 NOTE — Consult Note (Signed)
Pharmacy Consult Note - Anticoagulation  Pharmacy Consult for heparin Indication: pulmonary embolus  PATIENT MEASUREMENTS: Height: 5\' 7"  (170.2 cm) Weight: (!) 147.4 kg (325 lb) IBW/kg (Calculated) : 66.1 HEPARIN DW (KG): 102.1  VITAL SIGNS: Temp: 97.7 F (36.5 C) (09/18 0259) Temp Source: Oral (09/17 2252) BP: 105/69 (09/18 0259) Pulse Rate: 86 (09/18 0259)  Recent Labs    05/16/23 0958 05/16/23 2000 05/17/23 0419 05/18/23 0157  HGB  --   --  15.8 14.3  HCT  --   --  47.6 43.4  PLT  --   --  164 145*  APTT 29  --   --   --   LABPROT 15.6*  --  16.6*  --   INR 1.2  --  1.3*  --   HEPARINUNFRC  --    < > 0.37 0.13*  CREATININE  --   --  1.84* 1.43*   < > = values in this interval not displayed.    Estimated Creatinine Clearance: 83.3 mL/min (A) (by C-G formula based on SCr of 1.43 mg/dL (H)).  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  Regular the workup has not been forthcoming) previously followed by Dr. Isaiah Serge at [facility]   Hepatic steatosis    Hypertension    Morbid obesity with BMI of 50.0-59.9, adult [facility])    Peripheral vascular disease (HCC)    Presence of IVC filter    Has 2 IVC filters with significant thrombus burden superior to the filter.   Pulmonary embolus (HCC) 12/2016   Initially just treated with anticoagulation, but in February 2023 in April 2024 treated with EKOS thrombectomy for submassive PE.   Ventricular trigeminy    Weakness of both lower limbs     ASSESSMENT: 52 y.o. male with PMH chronic PE s/p IVC filter x 2 in 2005 and 2012 (on warfarin, INR goal 2-3), diabetes, HTN is presenting with shortness of breath,  attributed to pulmonary embolism. CT chest demonstrates large clot in right pulmonary artery with extension into RUL lobar pulmonary artery, essentially unchanged from prior study on 12/13/2022. Additionally, interval increase in peripheral nonocclusive thrombus in LLL lobar pulmonary artery. Imaging also shows compressed LV, consistent with right heart failure/strain. Patient is on chronic anticoagulation per chart review. Pharmacy has been consulted to initiate and manage heparin intravenous infusion.  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  Pertinent medications: PTA Warfarin 5 mg daily  Goal(s) of therapy: Heparin level 0.3 - 0.7 units/mL INR 2 - 3 Monitor platelets by anticoagulation protocol: Yes   Baseline anticoagulation labs: Recent Labs    05/16/23 0911 05/16/23 0958 05/17/23 0419 05/18/23 0157  APTT  --  29  --   --   INR  --  1.2 1.3*  --   HGB 16.8  --  15.8 14.3  PLT 163  --  164 145*   Warfarin Date INR Warfarin Dose 9/16 1.2 Held due to heparin drip  Heparin Date Time aPTT/HL Rate/Comment 09/18 0157 HL 0.13 subtherapeutic   PLAN: Bolus 3000 units x 1 Increase heparin infusion  to 2000 units/hour. Recheck HL in 6 hrs after rate change  Monitor CBC daily while on heparin infusion.  Otelia Sergeant, PharmD, [facility] 05/18/2023 3:17 AM

## 2023-05-18 NOTE — Progress Notes (Signed)
Transition of Care [facility]) - Inpatient Brief Assessment   Patient Details  Name: Ryan [name] MRN: [id number] Date of Birth: 1971/01/05  [facility] of [facility]) CM/SW Contact:    Truddie Hidden, RN Phone Number: 05/18/2023, 4:22 PM   Clinical Narrative:  TOC continuing to follow patient's progress throughout discharge planning.   Transition of Care Asessment: Insurance and Status: Insurance coverage has been reviewed Patient has primary care physician: Yes Home environment has been reviewed: Home Prior level of function:: Independent Prior/Current Home Services: No current home services Social Determinants of Health Reivew: SDOH reviewed no interventions necessary Readmission risk has been reviewed: Yes Transition of care needs: no transition of care needs at this time

## 2023-05-18 NOTE — Plan of Care (Signed)
Patient transferred from PACU s/p right pulmonary thrombectomy. Patient has remained stable and VSS. Patient has been free of falls or injury. Patient has heparin gtt. At 20 ml/hr. Patient denies complaints. Nursing will continue to monitor patient progress.

## 2023-05-19 DIAGNOSIS — I2699 Other pulmonary embolism without acute cor pulmonale: Secondary | ICD-10-CM | POA: Diagnosis not present

## 2023-05-19 LAB — CBC
HCT: 42.8 % (ref 39.0–52.0)
Hemoglobin: 13.9 g/dL (ref 13.0–17.0)
MCH: 29.6 pg (ref 26.0–34.0)
MCHC: 32.5 g/dL (ref 30.0–36.0)
MCV: 91.1 fL (ref 80.0–100.0)
Platelets: 162 10*3/uL (ref 150–400)
RBC: 4.7 MIL/uL (ref 4.22–5.81)
RDW: 15.3 % (ref 11.5–15.5)
WBC: 4.1 10*3/uL (ref 4.0–10.5)
nRBC: 0 % (ref 0.0–0.2)

## 2023-05-19 LAB — PROTIME-INR
INR: 1.2 (ref 0.8–1.2)
Prothrombin Time: 15.1 seconds (ref 11.4–15.2)

## 2023-05-19 LAB — HEPARIN LEVEL (UNFRACTIONATED)
Heparin Unfractionated: 0.21 IU/mL — ABNORMAL LOW (ref 0.30–0.70)
Heparin Unfractionated: 0.45 IU/mL (ref 0.30–0.70)
Heparin Unfractionated: 0.31 IU/mL (ref 0.30–0.70)

## 2023-05-19 MED ORDER — HEPARIN BOLUS VIA INFUSION
1500.0000 [IU] | Freq: Once | INTRAVENOUS | Status: AC
  Administered 2023-05-19: 1500 [IU] via INTRAVENOUS
  Filled 2023-05-19: qty 1500

## 2023-05-19 MED ORDER — WARFARIN SODIUM 7.5 MG PO TABS
7.5000 mg | ORAL_TABLET | Freq: Once | ORAL | Status: AC
  Administered 2023-05-19: 7.5 mg via ORAL
  Filled 2023-05-19: qty 1

## 2023-05-19 NOTE — Plan of Care (Signed)

## 2023-05-19 NOTE — Progress Notes (Signed)
Ryan [name] ([id number]) [id number] Page 1 of 10 Visit Report for 05/16/2023 Arrival Information Details Patient Name: Date of Service: [name], ROUND RLES [name]. 05/16/2023 8:00 [name] Medical Record Number: [id number] Patient Account Number: [id number] Date of Birth/Sex: Treating RN: 01/20/1971 (52 y.o. [name]) Yevonne Pax Primary Care Shamekia Tippets: [name] Other Clinician: Referring Jaleeyah Munce: Treating Takumi Din/Extender: [name] in Treatment: 4 Visit Information History Since Last Visit Added or deleted any medications: No Patient Arrived: Ambulatory Any new allergies or adverse reactions: No Arrival Time: 08:04 Had [name] fall or experienced change in No Accompanied By: self activities of daily living that may affect Transfer Assistance: None risk of falls: Patient Identification Verified: Yes Signs or symptoms of abuse/neglect since last visito No Secondary Verification Process Completed: Yes Hospitalized since last visit: No Patient Requires Transmission-Based Precautions: No Implantable device outside of the clinic excluding No Patient Has Alerts: Yes cellular tissue based products placed in the center Patient Alerts: Patient on Blood Thinner since last visit: diabetic Has Dressing in Place as Prescribed: Yes Has Compression in Place as Prescribed: Yes Pain Present Now: No Electronic Signature(s) Signed: 05/19/2023 1:18:15 PM By: Yevonne Pax RN Entered By: Yevonne Pax on 05/16/2023 08:14:27 -------------------------------------------------------------------------------- Clinic Level of Care Assessment Details Patient Name: Date of Service: [name], GAUER RLES [name]. 05/16/2023 8:00 [name] Medical Record Number: [id number] Patient Account Number: [id number] Date of Birth/Sex: Treating RN: 09-10-1970 (52 y.o. [name]) Yevonne Pax Primary Care Sary Bogie: [name] Other Clinician: Referring Mandell Pangborn: Treating Nettie Cromwell/Extender: [name] in Treatment: 4 Clinic Level of Care Assessment Items TOOL 1 Quantity Score []  - 0 Use when EandM and Procedure is performed on INITIAL visit ASSESSMENTS - Nursing Assessment / Reassessment []  - 0 General Physical Exam (combine w/ comprehensive assessment (listed just below) when performed on new pt. evals) []  - 0 Comprehensive Assessment (HX, ROS, Risk Assessments, Wounds Hx, etc.) Ryan [name] ([id number]) [phone].pdf Page 2 of 10 ASSESSMENTS - Wound and Skin Assessment / Reassessment []  - 0 Dermatologic / Skin Assessment (not related to wound area) ASSESSMENTS - Ostomy and/or Continence Assessment and Care []  - 0 Incontinence Assessment and Management []  - 0 Ostomy Care Assessment and Management (repouching, etc.) PROCESS - Coordination of Care []  - 0 Simple Patient / Family Education for ongoing care []  - 0 Complex (extensive) Patient / Family Education for ongoing care []  - 0 Staff obtains Chiropractor, Records, T Results / Process Orders est []  - 0 Staff telephones HHA, Nursing Homes / Clarify orders / etc []  - 0 Routine Transfer to another Facility (non-emergent condition) []  - 0 Routine Hospital Admission (non-emergent condition) []  - 0 New Admissions / Manufacturing engineer / Ordering NPWT Apligraf, etc. , []  - 0 Emergency Hospital Admission (emergent condition) PROCESS - Special Needs []  - 0 Pediatric / Minor Patient Management []  - 0 Isolation Patient Management []  - 0 Hearing / Language / Visual special needs []  - 0 Assessment of Community assistance (transportation, D/C planning, etc.) []  - 0 Additional assistance / Altered mentation []  - 0 Support Surface(s) Assessment (bed, cushion, seat, etc.) INTERVENTIONS - Miscellaneous []  - 0 External ear exam []  - 0 Patient Transfer (multiple staff / Nurse, adult / Similar devices) []  - 0 Simple Staple / Suture removal (25 or less) []  - 0 Complex  Staple / Suture removal (26 or more) []  - 0 Hypo/Hyperglycemic Management (do not check if billed separately) []  - 0 Ankle / Brachial Index (ABI) - do not  Ryan [name] ([id number]) [id number] Page 1 of 10 Visit Report for 05/16/2023 Arrival Information Details Patient Name: Date of Service: [name], ROUND RLES [name]. 05/16/2023 8:00 [name] Medical Record Number: [id number] Patient Account Number: [id number] Date of Birth/Sex: Treating RN: 01/20/1971 (52 y.o. [name]) Yevonne Pax Primary Care Shamekia Tippets: [name] Other Clinician: Referring Jaleeyah Munce: Treating Takumi Din/Extender: [name] in Treatment: 4 Visit Information History Since Last Visit Added or deleted any medications: No Patient Arrived: Ambulatory Any new allergies or adverse reactions: No Arrival Time: 08:04 Had [name] fall or experienced change in No Accompanied By: self activities of daily living that may affect Transfer Assistance: None risk of falls: Patient Identification Verified: Yes Signs or symptoms of abuse/neglect since last visito No Secondary Verification Process Completed: Yes Hospitalized since last visit: No Patient Requires Transmission-Based Precautions: No Implantable device outside of the clinic excluding No Patient Has Alerts: Yes cellular tissue based products placed in the center Patient Alerts: Patient on Blood Thinner since last visit: diabetic Has Dressing in Place as Prescribed: Yes Has Compression in Place as Prescribed: Yes Pain Present Now: No Electronic Signature(s) Signed: 05/19/2023 1:18:15 PM By: Yevonne Pax RN Entered By: Yevonne Pax on 05/16/2023 08:14:27 -------------------------------------------------------------------------------- Clinic Level of Care Assessment Details Patient Name: Date of Service: [name], GAUER RLES [name]. 05/16/2023 8:00 [name] Medical Record Number: [id number] Patient Account Number: [id number] Date of Birth/Sex: Treating RN: 09-10-1970 (52 y.o. [name]) Yevonne Pax Primary Care Sary Bogie: [name] Other Clinician: Referring Mandell Pangborn: Treating Nettie Cromwell/Extender: [name] in Treatment: 4 Clinic Level of Care Assessment Items TOOL 1 Quantity Score []  - 0 Use when EandM and Procedure is performed on INITIAL visit ASSESSMENTS - Nursing Assessment / Reassessment []  - 0 General Physical Exam (combine w/ comprehensive assessment (listed just below) when performed on new pt. evals) []  - 0 Comprehensive Assessment (HX, ROS, Risk Assessments, Wounds Hx, etc.) Ryan [name] ([id number]) [phone].pdf Page 2 of 10 ASSESSMENTS - Wound and Skin Assessment / Reassessment []  - 0 Dermatologic / Skin Assessment (not related to wound area) ASSESSMENTS - Ostomy and/or Continence Assessment and Care []  - 0 Incontinence Assessment and Management []  - 0 Ostomy Care Assessment and Management (repouching, etc.) PROCESS - Coordination of Care []  - 0 Simple Patient / Family Education for ongoing care []  - 0 Complex (extensive) Patient / Family Education for ongoing care []  - 0 Staff obtains Chiropractor, Records, T Results / Process Orders est []  - 0 Staff telephones HHA, Nursing Homes / Clarify orders / etc []  - 0 Routine Transfer to another Facility (non-emergent condition) []  - 0 Routine Hospital Admission (non-emergent condition) []  - 0 New Admissions / Manufacturing engineer / Ordering NPWT Apligraf, etc. , []  - 0 Emergency Hospital Admission (emergent condition) PROCESS - Special Needs []  - 0 Pediatric / Minor Patient Management []  - 0 Isolation Patient Management []  - 0 Hearing / Language / Visual special needs []  - 0 Assessment of Community assistance (transportation, D/C planning, etc.) []  - 0 Additional assistance / Altered mentation []  - 0 Support Surface(s) Assessment (bed, cushion, seat, etc.) INTERVENTIONS - Miscellaneous []  - 0 External ear exam []  - 0 Patient Transfer (multiple staff / Nurse, adult / Similar devices) []  - 0 Simple Staple / Suture removal (25 or less) []  - 0 Complex  Staple / Suture removal (26 or more) []  - 0 Hypo/Hyperglycemic Management (do not check if billed separately) []  - 0 Ankle / Brachial Index (ABI) - do not  check if billed separately Has the patient been seen at the hospital within the last three years: Yes Total Score: 0 Level Of Care: ____ Electronic Signature(s) Signed: 05/19/2023 1:18:15 PM By: Yevonne Pax RN Entered By: Yevonne Pax on 05/16/2023 09:19:13 -------------------------------------------------------------------------------- Compression Therapy Details Patient Name: Date of Service: [name] RLES [name]. 05/16/2023 8:00 [name] Medical Record Number: [id number] Patient Account Number: [id number] Date of Birth/Sex: Treating RN: Nov 08, 1970 (52 y.o. [name] Primary Care Gibran Veselka: [name] Other Clinician: Referring Aneshia Jacquet: Treating Izaih Kataoka/Extender: Octaviano Glow ([id number]) [id number] Page 3 of 10 Weeks in Treatment: 4 Compression Therapy Performed for Wound Assessment: Wound #1 Left,Medial Lower Leg Performed By: Clinician Yevonne Pax, RN Compression Type: Double Layer Post Procedure Diagnosis Same as Pre-procedure Electronic Signature(s) Signed: 05/19/2023 1:18:15 PM By: Yevonne Pax RN Entered By: Yevonne Pax on 05/16/2023 08:40:21 -------------------------------------------------------------------------------- Compression Therapy Details Patient Name: Date of Service: [name] RLES [name]. 05/16/2023 8:00 [name] Medical Record Number: [id number] Patient Account Number: [id number] Date of Birth/Sex: Treating RN: [birth year] (52 y.o. [name] Primary Care Dorrine Montone: [name] Other Clinician: Referring Adetokunbo Mccadden: Treating [place] Katich/Extender: [name] in Treatment: 4 Compression Therapy Performed for Wound Assessment: Wound #2 Left,Medial Foot Performed By: Clinician Yevonne Pax, RN Compression Type: Double  Layer Post Procedure Diagnosis Same as Pre-procedure Electronic Signature(s) Signed: 05/19/2023 1:18:15 PM By: Yevonne Pax RN Entered By: Yevonne Pax on 05/16/2023 08:40:21 -------------------------------------------------------------------------------- Encounter Discharge Information Details Patient Name: Date of Service: [name] RLES [name]. 05/16/2023 8:00 [name] Medical Record Number: [id number] Patient Account Number: [id number] Date of Birth/Sex: Treating RN: 1971-01-03 (52 y.o. [name] Primary Care Dayelin Balducci: [name] Other Clinician: Referring Garritt Molyneux: Treating Labaron Digirolamo/Extender: [name] in Treatment: 4 Encounter Discharge Information Items Discharge Condition: Stable Ambulatory Status: Ambulatory Discharge Destination: Emergency Room Telephoned: No TURAN, QUINTILIANI ([id number]) [phone].pdf Page 4 of 10 Orders Sent: No Transportation: Ambulance Accompanied By: self Schedule Follow-up Appointment: Yes Clinical Summary of Care: Notes patient transported via EMS to emergency room, EMS given med list, face sheet and progress note. patient short of breath lightheaded dizzy times 3 day , pulse ox 86 % on room air pulse 104. instructed to do pursed lip breathing o2 sat elevated to 92% after 5 breaths. dropped back down to 88% within 2 min notified [name] PA, o2 at 2 LPM placed on patient as ordered. O2 sat increased to 92% . EMS called, arrived, given report and patient transported to to emergency room Electronic Signature(s) Signed: 05/19/2023 1:18:15 PM By: Yevonne Pax RN Entered By: Yevonne Pax on 05/16/2023 08:51:13 -------------------------------------------------------------------------------- Lower Extremity Assessment Details Patient Name: Date of Service: JATIN, KINGRY RLES [name]. 05/16/2023 8:00 [name] Medical Record Number: [id number] Patient Account Number: [id number] Date of Birth/Sex: Treating RN: [birth year]  (52 y.o. [name]) Yevonne Pax Primary Care Raechell Singleton: [name] Other Clinician: Referring Jolan Mealor: Treating Preesha Benjamin/Extender: [name] in Treatment: 4 Edema Assessment Assessed: [name]: No] [Right: No] Edema: [Left: Ye] [Right: s] Calf Left: Right: Point of Measurement: 36 cm From Medial Instep 49 cm Ankle Left: Right: Point of Measurement: 10 cm From Medial Instep 31 cm Knee To Floor Left: Right: From Medial Instep 42 cm Vascular Assessment Pulses: Dorsalis Pedis Palpable: [Left:Yes] Extremity colors, hair growth, and conditions: Extremity Color: [Left:Hyperpigmented] Hair Growth on Extremity: [Left:No] Temperature of Extremity: [Left:Warm] Capillary Refill: [Left:< 3 seconds] Dependent Rubor: [Left:No] Blanched when Elevated: [Left:No No] Toe Nail Assessment Left: Right:  check if billed separately Has the patient been seen at the hospital within the last three years: Yes Total Score: 0 Level Of Care: ____ Electronic Signature(s) Signed: 05/19/2023 1:18:15 PM By: Yevonne Pax RN Entered By: Yevonne Pax on 05/16/2023 09:19:13 -------------------------------------------------------------------------------- Compression Therapy Details Patient Name: Date of Service: [name] RLES [name]. 05/16/2023 8:00 [name] Medical Record Number: [id number] Patient Account Number: [id number] Date of Birth/Sex: Treating RN: Nov 08, 1970 (52 y.o. [name] Primary Care Gibran Veselka: [name] Other Clinician: Referring Aneshia Jacquet: Treating Izaih Kataoka/Extender: Octaviano Glow ([id number]) [id number] Page 3 of 10 Weeks in Treatment: 4 Compression Therapy Performed for Wound Assessment: Wound #1 Left,Medial Lower Leg Performed By: Clinician Yevonne Pax, RN Compression Type: Double Layer Post Procedure Diagnosis Same as Pre-procedure Electronic Signature(s) Signed: 05/19/2023 1:18:15 PM By: Yevonne Pax RN Entered By: Yevonne Pax on 05/16/2023 08:40:21 -------------------------------------------------------------------------------- Compression Therapy Details Patient Name: Date of Service: [name] RLES [name]. 05/16/2023 8:00 [name] Medical Record Number: [id number] Patient Account Number: [id number] Date of Birth/Sex: Treating RN: [birth year] (52 y.o. [name] Primary Care Dorrine Montone: [name] Other Clinician: Referring Adetokunbo Mccadden: Treating [place] Katich/Extender: [name] in Treatment: 4 Compression Therapy Performed for Wound Assessment: Wound #2 Left,Medial Foot Performed By: Clinician Yevonne Pax, RN Compression Type: Double  Layer Post Procedure Diagnosis Same as Pre-procedure Electronic Signature(s) Signed: 05/19/2023 1:18:15 PM By: Yevonne Pax RN Entered By: Yevonne Pax on 05/16/2023 08:40:21 -------------------------------------------------------------------------------- Encounter Discharge Information Details Patient Name: Date of Service: [name] RLES [name]. 05/16/2023 8:00 [name] Medical Record Number: [id number] Patient Account Number: [id number] Date of Birth/Sex: Treating RN: 1971-01-03 (52 y.o. [name] Primary Care Dayelin Balducci: [name] Other Clinician: Referring Garritt Molyneux: Treating Labaron Digirolamo/Extender: [name] in Treatment: 4 Encounter Discharge Information Items Discharge Condition: Stable Ambulatory Status: Ambulatory Discharge Destination: Emergency Room Telephoned: No TURAN, QUINTILIANI ([id number]) [phone].pdf Page 4 of 10 Orders Sent: No Transportation: Ambulance Accompanied By: self Schedule Follow-up Appointment: Yes Clinical Summary of Care: Notes patient transported via EMS to emergency room, EMS given med list, face sheet and progress note. patient short of breath lightheaded dizzy times 3 day , pulse ox 86 % on room air pulse 104. instructed to do pursed lip breathing o2 sat elevated to 92% after 5 breaths. dropped back down to 88% within 2 min notified [name] PA, o2 at 2 LPM placed on patient as ordered. O2 sat increased to 92% . EMS called, arrived, given report and patient transported to to emergency room Electronic Signature(s) Signed: 05/19/2023 1:18:15 PM By: Yevonne Pax RN Entered By: Yevonne Pax on 05/16/2023 08:51:13 -------------------------------------------------------------------------------- Lower Extremity Assessment Details Patient Name: Date of Service: JATIN, KINGRY RLES [name]. 05/16/2023 8:00 [name] Medical Record Number: [id number] Patient Account Number: [id number] Date of Birth/Sex: Treating RN: [birth year]  (52 y.o. [name]) Yevonne Pax Primary Care Raechell Singleton: [name] Other Clinician: Referring Jolan Mealor: Treating Preesha Benjamin/Extender: [name] in Treatment: 4 Edema Assessment Assessed: [name]: No] [Right: No] Edema: [Left: Ye] [Right: s] Calf Left: Right: Point of Measurement: 36 cm From Medial Instep 49 cm Ankle Left: Right: Point of Measurement: 10 cm From Medial Instep 31 cm Knee To Floor Left: Right: From Medial Instep 42 cm Vascular Assessment Pulses: Dorsalis Pedis Palpable: [Left:Yes] Extremity colors, hair growth, and conditions: Extremity Color: [Left:Hyperpigmented] Hair Growth on Extremity: [Left:No] Temperature of Extremity: [Left:Warm] Capillary Refill: [Left:< 3 seconds] Dependent Rubor: [Left:No] Blanched when Elevated: [Left:No No] Toe Nail Assessment Left: Right:  Peripheral Weeks Of Treatment: 4 History: Venous Disease, Type II  Diabetes Clustered Wound: No Wound Measurements Length: (cm) 2.1 Width: (cm) 1.5 Depth: (cm) 0.2 Area: (cm) 2.474 Volume: (cm) 0.495 % Reduction in Area: 37% % Reduction in Volume: 58% Epithelialization: None Tunneling: No Undermining: No Wound Description Classification: Grade 2 Exudate Amount: Medium Exudate Type: Serosanguineous Exudate Color: red, brown Foul Odor After Cleansing: No Slough/Fibrino Yes Wound Bed Granulation Amount: Large (67-100%) Exposed Structure Granulation Quality: Pink Fascia Exposed: No Neace, Romey [name] ([id number]) [id number] Page 8 of 10 Necrotic Amount: Small (1-33%) Fat Layer (Subcutaneous Tissue) Exposed: Yes Necrotic Quality: Adherent Slough Tendon Exposed: No Muscle Exposed: No Joint Exposed: No Bone Exposed: No Treatment Notes Wound #1 (Lower Leg) Wound Laterality: Left, Medial Cleanser Soap and Water Discharge Instruction: Gently cleanse wound with antibacterial soap, rinse and pat dry prior to dressing wounds Wound Cleanser Discharge Instruction: Wash your hands with soap and water. Remove old dressing, discard into plastic bag and place into trash. Cleanse the wound with Wound Cleanser prior to applying [name] clean dressing using gauze sponges, not tissues or cotton balls. Do not scrub or use excessive force. Pat dry using gauze sponges, not tissue or cotton balls. Peri-Wound Care Topical Primary Dressing Hydrofera Blue Ready Transfer Foam, 2.5x2.5 (in/in) Discharge Instruction: Apply Hydrofera Blue Ready to wound bed as directed Secondary Dressing ABD Pad 5x9 (in/in) Discharge Instruction: Cover with ABD pad Secured With Compression Wrap Urgo K2, two layer compression system, large Compression Stockings Add-Ons Electronic Signature(s) Signed: 05/19/2023 1:18:15 PM By: Yevonne Pax RN Entered By: Yevonne Pax on 05/16/2023  08:37:44 -------------------------------------------------------------------------------- Wound Assessment Details Patient Name: Date of Service: [name] RLES [name]. 05/16/2023 8:00 [name] Medical Record Number: [id number] Patient Account Number: [id number] Date of Birth/Sex: Treating RN: 06/02/1971 (52 y.o. [name] Primary Care Jaquitta Dupriest: [name] Other Clinician: Referring Javelle Donigan: Treating Tyja Gortney/Extender: [name] in Treatment: 4 Wound Status Wound Number: 2 Primary Diabetic Wound/Ulcer of the Lower Extremity Etiology: Wound Location: Left, Medial Foot Wound Open Wounding Event: Gradually Appeared Status: Date Acquired: 02/28/2023 Comorbid Arrhythmia, Congestive Heart Failure, Hypertension, Peripheral Weeks Of Treatment: 4 History: Venous Disease, Type II Diabetes Clustered Wound: No Photos Casella, Michah [name] ([id number]) [id number] Page 9 of 10 Wound Measurements Length: (cm) 0.7 Width: (cm) 1.3 Depth: (cm) 0.2 Area: (cm) 0.715 Volume: (cm) 0.143 % Reduction in Area: 72% % Reduction in Volume: 72% Epithelialization: None Tunneling: No Undermining: No Wound Description Classification: Grade 1 Exudate Amount: Medium Exudate Type: Serosanguineous Exudate Color: red, brown Foul Odor After Cleansing: No Slough/Fibrino Yes Wound Bed Granulation Amount: Large (67-100%) Exposed Structure Granulation Quality: Pink Fascia Exposed: No Necrotic Amount: Small (1-33%) Fat Layer (Subcutaneous Tissue) Exposed: Yes Necrotic Quality: Adherent Slough Tendon Exposed: No Muscle Exposed: No Joint Exposed: No Bone Exposed: No Treatment Notes Wound #2 (Foot) Wound Laterality: Left, Medial Cleanser Soap and Water Discharge Instruction: Gently cleanse wound with antibacterial soap, rinse and pat dry prior to dressing wounds Wound Cleanser Discharge Instruction: Wash your hands with soap and water. Remove old dressing,  discard into plastic bag and place into trash. Cleanse the wound with Wound Cleanser prior to applying [name] clean dressing using gauze sponges, not tissues or cotton balls. Do not scrub or use excessive force. Pat dry using gauze sponges, not tissue or cotton balls. Peri-Wound Care Topical Primary Dressing Hydrofera Blue Ready Transfer Foam, 2.5x2.5 (in/in) Discharge Instruction: Apply Hydrofera Blue Ready to wound bed as directed Secondary Dressing ABD Pad 5x9 (

## 2023-05-19 NOTE — Progress Notes (Signed)
PROGRESS NOTE    Ryan [name]  AYT:[id number] DOB: May 09, 1971 DOA: 05/16/2023 PCP: [name], NP    Brief Narrative:   Ryan [name] is a 52 y.o. male with history of morbid obesity, history of PE and DVT on warfarin, patient is status post 2 IVC filter placement, CKD stage IIIa, history of hemodialysis secondary to contrast nephropathy 8 years ago, history of pulmonary embolism thrombectomy in April 2024, chronic lymphedema, chronic hypoxemic respiratory failure on nocturnal oxygen at home, no diagnosis of sleep apnea and does not use CPAP at home per patient report.  He presented to the hospital because of elevated heart rate, shortness of breath and decreased oxygen saturation.  He said his oxygen saturation was 86% on room air and his heart rate was in the 120s.  These findings were similar to what he experienced when he had pulmonary embolism in the past.  He takes warfarin but he said he had not taken his warfarin since Wednesday, 05/11/2023, because he ran out off his warfarin and he could not get it refilled in time.     He was found to have acute pulmonary embolism, acute hypoxemic respiratory failure and acute kidney injury.   Assessment & Plan:   Principal Problem:   Pulmonary emboli (HCC) Active Problems:   Acute respiratory failure with hypoxia (HCC)   AKI (acute kidney injury) (HCC)   History of pulmonary embolism and DVT   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)   Essential hypertension   CKD (chronic kidney disease), stage IIIa   Morbid obesity with BMI of 50.0-59.9, adult (HCC)   Hepatic steatosis   Hyperlipidemia associated with type 2 diabetes mellitus (HCC)   Physical deconditioning   At risk for sleep apnea   Lymphadenopathy   Anticoagulation goal of INR 2 to 3   Bilateral pulmonary embolism (HCC)   Cor pulmonale (chronic) (HCC)  Acute pulmonary embolism in a patient with history of DVT and pulmonary embolism:  Status post  mechanical thrombectomy with vascular surgery on 9/17.  Tolerated procedure well.  On room air.  Discussed anticoagulation strategy with vascular surgery and hematology.  Patient has previously failed DOAC's so warfarin is recommended at this time. Plan: Continue warfarin.  Continue heparin bridging.  Appreciate pharmacy dosing assistance.  Will need to keep patient admitted and on heparin drip until INR therapeutic.  Goal INR 2.5-3.  Acute on chronic hypoxemic respiratory failure: He only uses 2 L/min oxygen at night but does not use any oxygen during the day.  He is currently on 2 L/min oxygen via [place].  Continue as necessary     AKI: Renal function has approached baseline.  Hyponatremia: Mild and asymptomatic   Morbid obesity: This complicates overall care and prognosis.  He is at high risk for OSA and has been advised to follow-up with the pulmonologist for formal sleep testing.     Other comorbidities include cor pulmonale, morbid obesity, chronic left leg wound.   DVT prophylaxis: IV heparin, coumadin Code Status: Full Family Communication: None Disposition Plan: Status is: Inpatient Remains inpatient appropriate because: Recurrent pulmonary with some status post mechanical thrombectomy.  On anticoagulation Coumadin with heparin bridge.   Level of care: Progressive  Consultants:  Vascular surgery  Procedures:  Pulmonary thrombectomy  Antimicrobials: None   Subjective: Seen and examined.  Resting comfortably in bed.  No visible distress  Objective: Vitals:   05/18/23 2301 05/19/23 0409 05/19/23 0809 05/19/23 1147  BP: 107/74 98/64 113/68  116/67  Pulse: 72 69 73 87  Resp: 18 18 16 16   Temp: 98.7 F (37.1 C) 97.6 F (36.4 C) 98.4 F (36.9 C) 98.4 F (36.9 C)  TempSrc:   Oral   SpO2: 93% 96% 95% 95%  Weight:      Height:        Intake/Output Summary (Last 24 hours) at 05/19/2023 1424 Last data filed at 05/18/2023 2100 Gross per 24 hour  Intake 318.53 ml   Output 350 ml  Net -31.47 ml   Filed Weights   05/16/23 0906  Weight: (!) 147.4 kg    Examination:  General exam: No acute distress Respiratory system: Lungs.  Normal work of breathing.  2 L Cardiovascular system: S1-S2 RRR, no murmurs, no pedal edema Gastrointestinal system: Obese, soft, NT/ND, normal bowel sounds Central nervous system: Alert and oriented. No focal neurological deficits. Extremities: Symmetric 5 x 5 power. Skin: No rashes, lesions or ulcers Psychiatry: Judgement and insight appear normal. Mood & affect appropriate.     Data Reviewed: I have personally reviewed following labs and imaging studies  CBC: Recent Labs  Lab 05/16/23 0911 05/17/23 0419 05/18/23 0157 05/19/23 0422  WBC 4.4 5.2 5.0 4.1  HGB 16.8 15.8 14.3 13.9  HCT 49.7 47.6 43.4 42.8  MCV 89.4 90.2 91.6 91.1  PLT 163 164 145* 162   Basic Metabolic Panel: Recent Labs  Lab 05/16/23 0911 05/17/23 0419 05/18/23 0157  NA 132* 132* 134*  K 3.8 3.9 3.8  CL 101 101 103  CO2 20* 21* 21*  GLUCOSE 113* 114* 118*  BUN 19 22* 24*  CREATININE 1.77* 1.84* 1.43*  CALCIUM 8.5* 8.1* 8.0*   GFR: Estimated Creatinine Clearance: 83.3 mL/min (A) (by C-G formula based on SCr of 1.43 mg/dL (H)). Liver Function Tests: No results for input(s): "AST", "ALT", "ALKPHOS", "BILITOT", "PROT", "ALBUMIN" in the last 168 hours. No results for input(s): "LIPASE", "AMYLASE" in the last 168 hours. No results for input(s): "AMMONIA" in the last 168 hours. Coagulation Profile: Recent Labs  Lab 05/16/23 0958 05/17/23 0419 05/19/23 1157  INR 1.2 1.3* 1.2   Cardiac Enzymes: No results for input(s): "CKTOTAL", "CKMB", "CKMBINDEX", "TROPONINI" in the last 168 hours. BNP (last 3 results) No results for input(s): "PROBNP" in the last 8760 hours. HbA1C: No results for input(s): "HGBA1C" in the last 72 hours. CBG: No results for input(s): "GLUCAP" in the last 168 hours. Lipid Profile: No results for input(s):  "CHOL", "HDL", "LDLCALC", "TRIG", "CHOLHDL", "LDLDIRECT" in the last 72 hours. Thyroid Function Tests: No results for input(s): "TSH", "T4TOTAL", "FREET4", "T3FREE", "THYROIDAB" in the last 72 hours. Anemia Panel: No results for input(s): "VITAMINB12", "FOLATE", "FERRITIN", "TIBC", "IRON", "RETICCTPCT" in the last 72 hours. Sepsis Labs: No results for input(s): "PROCALCITON", "LATICACIDVEN" in the last 168 hours.  No results found for this or any previous visit (from the past 240 hour(s)).       Radiology Studies: PERIPHERAL VASCULAR CATHETERIZATION  Result Date: 05/17/2023 See surgical note for result.       Scheduled Meds:  metoprolol succinate  25 mg Oral Daily   warfarin  7.5 mg Oral ONCE-1600   Warfarin - Pharmacist Dosing Inpatient   Does not apply q1600   Continuous Infusions:  heparin 2,200 Units/hr (05/19/23 0545)     LOS: 3 days       Tresa Moore, MD [facility]   If 7PM-7AM, please contact night-coverage  05/19/2023, 2:24 PM

## 2023-05-19 NOTE — Consult Note (Signed)
ANTICOAGULATION CONSULT NOTE   Pharmacy Consult for warfarin + heparin bridge Indication:  history of PE and DVT with a new PE.   Allergies  Allergen Reactions   Metrizamide Other (See Comments)    Kidney failure requiring dialysis Kidney failure requiring dialysis    Clindamycin/Lincomycin Rash   Lincomycin Rash   Sulfa Antibiotics Rash    Patient Measurements: Height: 5\' 7"  (170.2 cm) Weight: (!) 147.4 kg (325 lb) IBW/kg (Calculated) : 66.1 Heparin Dosing Weight: 102.1 kg   Vital Signs: Temp: 97.9 F (36.6 C) (09/19 1622) Temp Source: Oral (09/19 1622) BP: 105/64 (09/19 1622) Pulse Rate: 84 (09/19 1622)  Labs: Recent Labs    05/17/23 0419 05/18/23 0157 05/18/23 1004 05/19/23 0422 05/19/23 1157 05/19/23 1742  HGB 15.8 14.3  --  13.9  --   --   HCT 47.6 43.4  --  42.8  --   --   PLT 164 145*  --  162  --   --   LABPROT 16.6*  --   --   --  15.1  --   INR 1.3*  --   --   --  1.2  --   HEPARINUNFRC 0.37 0.13*   < > 0.21* 0.45 0.31  CREATININE 1.84* 1.43*  --   --   --   --    < > = values in this interval not displayed.    Estimated Creatinine Clearance: 83.3 mL/min (A) (by C-G formula based on SCr of 1.43 mg/dL (H)).   Medical History: Past Medical History:  Diagnosis Date   (HFpEF) heart failure with preserved ejection fraction (HCC)    Arrhythmia    atrial fibrillation   Diabetes mellitus type II, non insulin dependent (HCC)    Diabetes mellitus without complication (HCC)    per pt-pre diabetic-Dr Dario Guardian stated said pt   DVT (deep venous thrombosis) (HCC)    Over 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  Regular the workup has not been forthcoming) previously [facility] at [facility]   Hepatic steatosis    Hypertension    Morbid obesity with BMI of 50.0-59.9, adult [facility])    Peripheral vascular disease (HCC)    Presence of IVC filter    Has 2 IVC filters with significant thrombus burden superior to the filter.   Pulmonary embolus (HCC) 12/2016   Initially just treated  with anticoagulation, but in February 2023 in April 2024 treated with EKOS thrombectomy for submassive PE.   Ventricular trigeminy    Weakness of both lower limbs     Medications:  Medications Prior to Admission  Medication Sig Dispense Refill Last Dose   albuterol (VENTOLIN HFA) 108 (90 Base) MCG/ACT inhaler Inhale 2 puffs into the lungs every 6 (six) hours as needed for wheezing or shortness of breath. 8 g 2 prn at unk   magnesium oxide (MAG-OX) 400 (240 Mg) MG tablet Take 400 mg by mouth daily.   Past Month   metoprolol succinate (TOPROL-XL) 25 MG 24 hr tablet Take 1 tablet (25 mg total) by mouth daily.   05/16/2023   sacubitril-valsartan (ENTRESTO) 24-26 MG Take 1 tablet by mouth 2 (two) times daily. 60 tablet 3 05/16/2023   furosemide (LASIX) 40 MG tablet Take 1 tablet (40 mg total) by mouth every other day. 30 tablet 0 05/12/2023   OXYGEN Inhale 1 L into the lungs daily.      tirzepatide [facility]) 5 MG/0.5ML Pen Inject 5 mg into the skin once a week. 6 mL 0  05/09/2023   warfarin (COUMADIN) 5 MG tablet Take 1 tablet (5 mg total) by mouth daily. 90 tablet 1 05/11/2023   Scheduled:   metoprolol succinate  25 mg Oral Daily   Warfarin - Pharmacist Dosing Inpatient   Does not apply q1600   Infusions:   heparin 2,200 Units/hr (05/19/23 1538)   PRN: acetaminophen **OR** acetaminophen, albuterol, Heparin (Porcine) in NaCl, iodixanol, lidocaine (PF), ondansetron **OR** ondansetron (ZOFRAN) IV, mouth rinse, senna-docusate Anti-infectives (From admission, onward)    Start     Dose/Rate Route Frequency Ordered Stop   05/17/23 1655  ceFAZolin (ANCEF) IVPB 2g/100 mL premix  Status:  Discontinued        2 g 200 mL/hr over 30 Minutes Intravenous 30 min pre-op 05/17/23 1655 05/17/23 1910       Assessment: 52 y.o. male who presents from wound care clinic with shortness of breath requiring 2 liters on nasal cannula oxygen at all times with oxygen saturations of 90% while on oxygen.  Upon workup  patient was found to have a pulmonary embolism in the right main pulmonary artery which she has had prior.  There is an extension of the clot to the right upper lobe.  There is also interval increase in the peripheral nonocclusive thrombus in the left lower lung pulmonary artery when compared to prior exam.  He is also noted to have a compressed left ventricle compatible with right heart failure/strain.  He also has dilatation of the main pulmonary trunk which is nonspecific but can be seen with pulmonary artery hypertension. Vascular surgery consulted and s/p thrombectomy. Patient is on warfarin 5 mg daily at home but on presentation pt's INR was subtherapeutic. No major DDI. 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 Limited data with DOACs.    5284 0157 HL 0.13  9/18 1004 HL 0.38  9/19 0422 HL 0.21 9/19 1157 HL 0.45  9/19 1802 HL 0.31    Date INR Warfarin Dose  9/18 1.3 7.5 mg   9/19 1.2 7.5 mg      Goal of Therapy:  INR 2-3 Monitor platelets by anticoagulation protocol: Yes   Plan:  Heparin:  Heparin level is therapeutic x 2. Will continue heparin infusion at 2200 units/hr. Recheck heparin level and CBC daily with AM labs while on heparin.   Warfarin:  INR is subtherapeutic. Will give warfarin 7.5 mg x 1 tonight (~50% increase from home dose), to help get INR therapeutic. Daily INR. CBC daily while on heparin. Stop heparin infusion once INR > 2.   Recommend possibly aiming for an INR goal of 2.5 -3.    Gardner Candle, PharmD, BCPS 05/19/2023,6:01 PM

## 2023-05-19 NOTE — Consult Note (Signed)
ANTICOAGULATION CONSULT NOTE   Pharmacy Consult for warfarin + heparin bridge Indication:  history of PE and DVT with a new PE.   Allergies  Allergen Reactions   Metrizamide Other (See Comments)    Kidney failure requiring dialysis Kidney failure requiring dialysis    Clindamycin/Lincomycin Rash   Lincomycin Rash   Sulfa Antibiotics Rash    Patient Measurements: Height: 5\' 7"  (170.2 cm) Weight: (!) 147.4 kg (325 lb) IBW/kg (Calculated) : 66.1 Heparin Dosing Weight: 102.1 kg   Vital Signs: Temp: 97.6 F (36.4 C) (09/19 0409) BP: 98/64 (09/19 0409) Pulse Rate: 69 (09/19 0409)  Labs: Recent Labs    05/16/23 0911 05/16/23 0958 05/16/23 2000 05/17/23 0419 05/18/23 0157 05/18/23 1004 05/18/23 1542 05/19/23 0422  HGB 16.8  --   --  15.8 14.3  --   --  13.9  HCT 49.7  --   --  47.6 43.4  --   --  42.8  PLT 163  --   --  164 145*  --   --  162  APTT  --  29  --   --   --   --   --   --   LABPROT  --  15.6*  --  16.6*  --   --   --   --   INR  --  1.2  --  1.3*  --   --   --   --   HEPARINUNFRC  --   --    < > 0.37 0.13* 0.38 0.36 0.21*  CREATININE 1.77*  --   --  1.84* 1.43*  --   --   --    < > = values in this interval not displayed.    Estimated Creatinine Clearance: 83.3 mL/min (A) (by C-G formula based on SCr of 1.43 mg/dL (H)).   Medical History: Past Medical History:  Diagnosis Date   (HFpEF) heart failure with preserved ejection fraction (HCC)    Arrhythmia    atrial fibrillation   Diabetes mellitus type II, non insulin dependent (HCC)    Diabetes mellitus without complication (HCC)    per pt-pre diabetic-Dr Dario Guardian stated said pt   DVT (deep venous thrombosis) (HCC)    Over 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  Regular the workup has not been forthcoming) previously followed by Dr. Isaiah Serge at [facility]   Hepatic steatosis    Hypertension    Morbid obesity with BMI of 50.0-59.9, adult [facility])    Peripheral vascular disease (HCC)    Presence of IVC filter    Has 2 IVC filters with  significant thrombus burden superior to the filter.   Pulmonary embolus (HCC) 12/2016   Initially just treated with anticoagulation, but in February 2023 in April 2024 treated with EKOS thrombectomy for submassive PE.   Ventricular trigeminy    Weakness of both lower limbs     Medications:  Medications Prior to Admission  Medication Sig Dispense Refill Last Dose   albuterol (VENTOLIN HFA) 108 (90 Base) MCG/ACT inhaler Inhale 2 puffs into the lungs every 6 (six) hours as needed for wheezing or shortness of breath. 8 g 2 prn at unk   magnesium oxide (MAG-OX) 400 (240 Mg) MG tablet Take 400 mg by mouth daily.   Past Month   metoprolol succinate (TOPROL-XL) 25 MG 24 hr tablet Take 1 tablet (25 mg total) by mouth daily.   05/16/2023   sacubitril-valsartan (ENTRESTO) 24-26 MG Take 1 tablet by mouth 2 (two) times daily. 60  tablet 3 05/16/2023   furosemide (LASIX) 40 MG tablet Take 1 tablet (40 mg total) by mouth every other day. 30 tablet 0 05/12/2023   OXYGEN Inhale 1 L into the lungs daily.      tirzepatide [facility]) 5 MG/0.5ML Pen Inject 5 mg into the skin once a week. 6 mL 0 05/09/2023   warfarin (COUMADIN) 5 MG tablet Take 1 tablet (5 mg total) by mouth daily. 90 tablet 1 05/11/2023   Scheduled:   metoprolol succinate  25 mg Oral Daily   Warfarin - Pharmacist Dosing Inpatient   Does not apply q1600   Infusions:   heparin 2,000 Units/hr (05/19/23 0107)   PRN: acetaminophen **OR** acetaminophen, albuterol, Heparin (Porcine) in NaCl, iodixanol, lidocaine (PF), ondansetron **OR** ondansetron (ZOFRAN) IV, mouth rinse, senna-docusate Anti-infectives (From admission, onward)    Start     Dose/Rate Route Frequency Ordered Stop   05/17/23 1655  ceFAZolin (ANCEF) IVPB 2g/100 mL premix  Status:  Discontinued        2 g 200 mL/hr over 30 Minutes Intravenous 30 min pre-op 05/17/23 1655 05/17/23 1910       Assessment: 52 y.o. male who presents from wound care clinic with shortness of breath  requiring 2 liters on nasal cannula oxygen at all times with oxygen saturations of 90% while on oxygen.  Upon workup patient was found to have a pulmonary embolism in the right main pulmonary artery which she has had prior.  There is an extension of the clot to the right upper lobe.  There is also interval increase in the peripheral nonocclusive thrombus in the left lower lung pulmonary artery when compared to prior exam.  He is also noted to have a compressed left ventricle compatible with right heart failure/strain.  He also has dilatation of the main pulmonary trunk which is nonspecific but can be seen with pulmonary artery hypertension. Vascular surgery consulted and s/p thrombectomy. Patient is on warfarin 5 mg daily at home but on presentation pt's INR was subtherapeutic. No major DDI. 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 Limited data with DOACs.    0918 0157 HL 0.13  9/18 1004 HL 0.38  9/18 1542 HL 0.36 9/19 0422 HL 0.21, subtherapeutic    Date INR Warfarin Dose  9/18 1.3 7.5 mg          Goal of Therapy:  INR 2-3 Monitor platelets by anticoagulation protocol: Yes   Plan:  Heparin:  --Bolus 1500 units.  --Will increase heparin infusion to 2200 units/hr.  --Recheck heparin level in 6 hrs after rate change --CBC daily while on heparin.   Warfarin:  INR is subtherapeutic. Will give warfarin 7.5 mg x 1 tonight (~50% increase from home dose), to help get INR therapeutic. Daily INR. CBC daily while on heparin. Stop heparin infusion once INR > 2.   Recommend possibly aiming for an INR goal of 2.5 -3.   Otelia Sergeant, PharmD, [facility] 05/19/2023 5:21 AM

## 2023-05-19 NOTE — Consult Note (Signed)
ANTICOAGULATION CONSULT NOTE   Pharmacy Consult for warfarin + heparin bridge Indication:  history of PE and DVT with a new PE.   Allergies  Allergen Reactions   Metrizamide Other (See Comments)    Kidney failure requiring dialysis Kidney failure requiring dialysis    Clindamycin/Lincomycin Rash   Lincomycin Rash   Sulfa Antibiotics Rash    Patient Measurements: Height: 5\' 7"  (170.2 cm) Weight: (!) 147.4 kg (325 lb) IBW/kg (Calculated) : 66.1 Heparin Dosing Weight: 102.1 kg   Vital Signs: Temp: 98.4 F (36.9 C) (09/19 1147) Temp Source: Oral (09/19 0809) BP: 116/67 (09/19 1147) Pulse Rate: 87 (09/19 1147)  Labs: Recent Labs    05/17/23 0419 05/18/23 0157 05/18/23 1004 05/18/23 1542 05/19/23 0422 05/19/23 1157  HGB 15.8 14.3  --   --  13.9  --   HCT 47.6 43.4  --   --  42.8  --   PLT 164 145*  --   --  162  --   LABPROT 16.6*  --   --   --   --  15.1  INR 1.3*  --   --   --   --  1.2  HEPARINUNFRC 0.37 0.13*   < > 0.36 0.21* 0.45  CREATININE 1.84* 1.43*  --   --   --   --    < > = values in this interval not displayed.    Estimated Creatinine Clearance: 83.3 mL/min (A) (by C-G formula based on SCr of 1.43 mg/dL (H)).   Medical History: Past Medical History:  Diagnosis Date   (HFpEF) heart failure with preserved ejection fraction (HCC)    Arrhythmia    atrial fibrillation   Diabetes mellitus type II, non insulin dependent (HCC)    Diabetes mellitus without complication (HCC)    per pt-pre diabetic-Dr Dario Guardian stated said pt   DVT (deep venous thrombosis) (HCC)    Over 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  Regular the workup has not been forthcoming) previously [facility] by [facility] Isaiah Serge at [facility]   Hepatic steatosis    Hypertension    Morbid obesity with BMI of 50.0-59.9, adult [facility])    Peripheral vascular disease (HCC)    Presence of IVC filter    Has 2 IVC filters with significant thrombus burden superior to the filter.   Pulmonary embolus (HCC) 12/2016   Initially just treated  with anticoagulation, but in February 2023 in April 2024 treated with EKOS thrombectomy for submassive PE.   Ventricular trigeminy    Weakness of both lower limbs     Medications:  Medications Prior to Admission  Medication Sig Dispense Refill Last Dose   albuterol (VENTOLIN HFA) 108 (90 Base) MCG/ACT inhaler Inhale 2 puffs into the lungs every 6 (six) hours as needed for wheezing or shortness of breath. 8 g 2 prn at unk   magnesium oxide (MAG-OX) 400 (240 Mg) MG tablet Take 400 mg by mouth daily.   Past Month   metoprolol succinate (TOPROL-XL) 25 MG 24 hr tablet Take 1 tablet (25 mg total) by mouth daily.   05/16/2023   sacubitril-valsartan (ENTRESTO) 24-26 MG Take 1 tablet by mouth 2 (two) times daily. 60 tablet 3 05/16/2023   furosemide (LASIX) 40 MG tablet Take 1 tablet (40 mg total) by mouth every other day. 30 tablet 0 05/12/2023   OXYGEN Inhale 1 L into the lungs daily.      tirzepatide [facility]) 5 MG/0.5ML Pen Inject 5 mg into the skin once a week. 6 mL 0  05/09/2023   warfarin (COUMADIN) 5 MG tablet Take 1 tablet (5 mg total) by mouth daily. 90 tablet 1 05/11/2023   Scheduled:   metoprolol succinate  25 mg Oral Daily   Warfarin - Pharmacist Dosing Inpatient   Does not apply q1600   Infusions:   heparin 2,200 Units/hr (05/19/23 0545)   PRN: acetaminophen **OR** acetaminophen, albuterol, Heparin (Porcine) in NaCl, iodixanol, lidocaine (PF), ondansetron **OR** ondansetron (ZOFRAN) IV, mouth rinse, senna-docusate Anti-infectives (From admission, onward)    Start     Dose/Rate Route Frequency Ordered Stop   05/17/23 1655  ceFAZolin (ANCEF) IVPB 2g/100 mL premix  Status:  Discontinued        2 g 200 mL/hr over 30 Minutes Intravenous 30 min pre-op 05/17/23 1655 05/17/23 1910       Assessment: 52 y.o. male who presents from wound care clinic with shortness of breath requiring 2 liters on nasal cannula oxygen at all times with oxygen saturations of 90% while on oxygen.  Upon workup  patient was found to have a pulmonary embolism in the right main pulmonary artery which she has had prior.  There is an extension of the clot to the right upper lobe.  There is also interval increase in the peripheral nonocclusive thrombus in the left lower lung pulmonary artery when compared to prior exam.  He is also noted to have a compressed left ventricle compatible with right heart failure/strain.  He also has dilatation of the main pulmonary trunk which is nonspecific but can be seen with pulmonary artery hypertension. Vascular surgery consulted and s/p thrombectomy. Patient is on warfarin 5 mg daily at home but on presentation pt's INR was subtherapeutic. No major DDI. 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 Limited data with DOACs.    0918 0157 HL 0.13  9/18 1004 HL 0.38  9/19 0422 HL 0.21 9/19 1157 HL 0.45     Date INR Warfarin Dose  9/18 1.3 7.5 mg   9/19 1.2 7.5 mg      Goal of Therapy:  INR 2-3 Monitor platelets by anticoagulation protocol: Yes   Plan:  Heparin:  Heparin level is therapeutic. Will continue heparin infusion at 2200 units/hr. Recheck heparin level in 6 hours. CBC daily while on heparin.   Warfarin:  INR is subtherapeutic. Will give warfarin 7.5 mg x 1 tonight (~50% increase from home dose), to help get INR therapeutic. Daily INR. CBC daily while on heparin. Stop heparin infusion once INR > 2.   Recommend possibly aiming for an INR goal of 2.5 -3.    Ronnald Ramp, PharmD, BCPS 05/19/2023,12:44 PM

## 2023-05-20 DIAGNOSIS — I2699 Other pulmonary embolism without acute cor pulmonale: Secondary | ICD-10-CM | POA: Diagnosis not present

## 2023-05-20 LAB — CBC
HCT: 40.5 % (ref 39.0–52.0)
Hemoglobin: 13.3 g/dL (ref 13.0–17.0)
MCH: 30 pg (ref 26.0–34.0)
MCHC: 32.8 g/dL (ref 30.0–36.0)
MCV: 91.4 fL (ref 80.0–100.0)
Platelets: 180 10*3/uL (ref 150–400)
RBC: 4.43 MIL/uL (ref 4.22–5.81)
RDW: 15.4 % (ref 11.5–15.5)
WBC: 4.1 10*3/uL (ref 4.0–10.5)
nRBC: 0 % (ref 0.0–0.2)

## 2023-05-20 LAB — BASIC METABOLIC PANEL
Anion gap: 9 (ref 5–15)
BUN: 18 mg/dL (ref 6–20)
CO2: 24 mmol/L (ref 22–32)
Calcium: 8.1 mg/dL — ABNORMAL LOW (ref 8.9–10.3)
Chloride: 103 mmol/L (ref 98–111)
Creatinine, Ser: 1.43 mg/dL — ABNORMAL HIGH (ref 0.61–1.24)
GFR, Estimated: 59 mL/min — ABNORMAL LOW (ref >60)
Glucose, Bld: 106 mg/dL — ABNORMAL HIGH (ref 70–99)
Potassium: 3.6 mmol/L (ref 3.5–5.1)
Sodium: 136 mmol/L (ref 135–145)

## 2023-05-20 LAB — HEPARIN LEVEL (UNFRACTIONATED): Heparin Unfractionated: 0.37 IU/mL (ref 0.30–0.70)

## 2023-05-20 LAB — PROTIME-INR
INR: 1.2 (ref 0.8–1.2)
Prothrombin Time: 14.9 seconds (ref 11.4–15.2)

## 2023-05-20 MED ORDER — WARFARIN SODIUM 6 MG PO TABS
12.0000 mg | ORAL_TABLET | Freq: Once | ORAL | Status: AC
  Administered 2023-05-20: 12 mg via ORAL
  Filled 2023-05-20: qty 2

## 2023-05-20 MED ORDER — FUROSEMIDE 40 MG PO TABS
40.0000 mg | ORAL_TABLET | ORAL | Status: DC
  Administered 2023-05-20 – 2023-05-24 (×3): 40 mg via ORAL
  Filled 2023-05-20 (×3): qty 1

## 2023-05-20 MED ORDER — WARFARIN SODIUM 10 MG PO TABS: 10.0000 mg | ORAL_TABLET | Freq: Once | ORAL | Status: DC

## 2023-05-20 MED ORDER — ENOXAPARIN SODIUM 150 MG/ML IJ SOSY
150.0000 mg | PREFILLED_SYRINGE | Freq: Two times a day (BID) | INTRAMUSCULAR | Status: AC
  Administered 2023-05-20 – 2023-05-23 (×7): 150 mg via SUBCUTANEOUS
  Filled 2023-05-20 (×7): qty 1

## 2023-05-20 NOTE — Plan of Care (Signed)
Problem: Education: Goal: Knowledge of General Education information will improve Description: Including pain rating scale, medication(s)/side effects and non-pharmacologic comfort measures Outcome: Progressing   Problem: Clinical Measurements: Goal: Will remain free from infection Outcome: Progressing   Problem: Clinical Measurements: Goal: Respiratory complications will improve Outcome: Progressing   Problem: Clinical Measurements: Goal: Cardiovascular complication will be avoided Outcome: Progressing   Problem: Activity: Goal: Risk for activity intolerance will decrease Outcome: Progressing   Problem: Nutrition: Goal: Adequate nutrition will be maintained Outcome: Progressing   Problem: Elimination: Goal: Will not experience complications related to bowel motility Outcome: Progressing   Problem: Elimination: Goal: Will not experience complications related to urinary retention Outcome: Progressing   Problem: Pain Managment: Goal: General experience of comfort will improve Outcome: Progressing

## 2023-05-20 NOTE — Progress Notes (Signed)
Progress Note    05/20/2023 2:06 PM 3 Days Post-Op  Subjective:  Ryan [name] is a 52 y.o. male with history of morbid obesity, history of PE and DVT on warfarin, patient is status post 2 IVC filter placement, history of pulmonary embolism thrombectomy in April 2024. Upon work up for shortness of breath patient was found to have bilateral pulmonary embolisms due to non compliance with taking his coumadin.   Today he is POD #3 from a pulmonary thrombectomy. He currently rests comfortably in bed and not requiring oxygen. No complaints over night. Vitals all remain stable. Patient endorses he will be discharged once his INR is therapeutic.    Vitals:   05/20/23 0807 05/20/23 1155  BP: 115/78 119/83  Pulse: 72 88  Resp:    Temp: 97.9 F (36.6 C) 98.1 F (36.7 C)  SpO2: 94% 98%   Physical Exam: Cardiac:  Irregular rhythm but rate controlled. No murmurs  Lungs:  normal non-labored breathing, without Rales, rhonchi, wheezing. Diminished in the bases on the right.  Incisions:  Right neck incision with dressing clean dry and intact. No hematoma to note.  Extremities:  Unable to palpate pulse due to patients morbid obesity and edema to lower extremities. Without ischemic changes, without Gangrene , without cellulitis; without open wounds;  Abdomen:  Positive bowel sounds throughout, soft, non tender and non distended.  Neurologic: AAOX4 and answers all questions appropriately.   CBC    Component Value Date/Time   WBC 4.1 05/20/2023 0347   RBC 4.43 05/20/2023 0347   HGB 13.3 05/20/2023 0347   HGB 15.8 12/23/2022 0953   HCT 40.5 05/20/2023 0347   HCT 47.3 12/23/2022 0953   PLT 180 05/20/2023 0347   PLT 297 12/23/2022 0953   MCV 91.4 05/20/2023 0347   MCV 92 12/23/2022 0953   MCV 90 12/28/2014 0444   MCH 30.0 05/20/2023 0347   MCHC 32.8 05/20/2023 0347   RDW 15.4 05/20/2023 0347   RDW 13.2 12/23/2022 0953   RDW 14.0 12/28/2014 0444   LYMPHSABS 1.5 01/19/2023 1043   LYMPHSABS  1.4 12/23/2022 0953   LYMPHSABS 1.3 12/28/2014 0444   MONOABS 0.5 01/19/2023 1043   MONOABS 2.1 (H) 12/28/2014 0444   EOSABS 0.1 01/19/2023 1043   EOSABS 0.1 12/23/2022 0953   EOSABS 0.1 12/28/2014 0444   BASOSABS 0.0 01/19/2023 1043   BASOSABS 0.0 12/23/2022 0953   BASOSABS 0.1 12/28/2014 0444    BMET    Component Value Date/Time   NA 136 05/20/2023 0347   NA 138 01/03/2023 1442   NA 130 (L) 12/28/2014 1602   K 3.6 05/20/2023 0347   K 3.8 12/28/2014 1602   CL 103 05/20/2023 0347   CL 94 (L) 12/28/2014 1602   CO2 24 05/20/2023 0347   CO2 24 12/28/2014 1602   GLUCOSE 106 (H) 05/20/2023 0347   GLUCOSE 121 (H) 12/28/2014 1602   BUN 18 05/20/2023 0347   BUN 11 01/03/2023 1442   BUN 57 (H) 12/28/2014 1602   CREATININE 1.43 (H) 05/20/2023 0347   CREATININE 5.55 (H) 12/28/2014 1602   CALCIUM 8.1 (L) 05/20/2023 0347   CALCIUM 8.1 (L) 12/28/2014 1602   GFRNONAA 59 (L) 05/20/2023 0347   GFRNONAA 11 (L) 12/28/2014 1602   GFRAA 55 (L) 03/07/2020 2009   GFRAA 13 (L) 12/28/2014 1602    INR    Component Value Date/Time   INR 1.2 05/20/2023 0347   INR 2.9 (H) 05/03/2023 1604   INR 1.2 12/23/2014  2219     Intake/Output Summary (Last 24 hours) at 05/20/2023 1406 Last data filed at 05/20/2023 0100 Gross per 24 hour  Intake 120 ml  Output 675 ml  Net -555 ml     Assessment/Plan:  52 y.o. male is s/p Pulmonary Thrombectomy 3 Days Post-Op   PLAN: Vascular Surgery is in agreement with the Hospitalist team. Okay to discharge only when medically ready and INR is therapeutic.   DVT prophylaxis:  Coumadin   Marcie [facility] 05/20/2023 2:06 PM

## 2023-05-20 NOTE — Consult Note (Signed)
ANTICOAGULATION CONSULT NOTE   Pharmacy Consult for warfarin + heparin bridge Indication:  history of PE and DVT with a new PE.   Allergies  Allergen Reactions   Metrizamide Other (See Comments)    Kidney failure requiring dialysis Kidney failure requiring dialysis    Clindamycin/Lincomycin Rash   Lincomycin Rash   Sulfa Antibiotics Rash    Patient Measurements: Height: 5\' 7"  (170.2 cm) Weight: (!) 147.4 kg (325 lb) IBW/kg (Calculated) : 66.1 Heparin Dosing Weight: 102.1 kg   Vital Signs: Temp: 98.1 F (36.7 C) (09/20 0351) BP: 106/79 (09/20 0351) Pulse Rate: 72 (09/20 0351)  Labs: Recent Labs    05/18/23 0157 05/18/23 1004 05/19/23 0422 05/19/23 1157 05/19/23 1742 05/20/23 0347  HGB 14.3  --  13.9  --   --  13.3  HCT 43.4  --  42.8  --   --  40.5  PLT 145*  --  162  --   --  180  LABPROT  --   --   --  15.1  --  14.9  INR  --   --   --  1.2  --  1.2  HEPARINUNFRC 0.13*   < > 0.21* 0.45 0.31 0.37  CREATININE 1.43*  --   --   --   --  1.43*   < > = values in this interval not displayed.    Estimated Creatinine Clearance: 83.3 mL/min (A) (by C-G formula based on SCr of 1.43 mg/dL (H)).   Medical History: Past Medical History:  Diagnosis Date   (HFpEF) heart failure with preserved ejection fraction (HCC)    Arrhythmia    atrial fibrillation   Diabetes mellitus type II, non insulin dependent (HCC)    Diabetes mellitus without complication (HCC)    per pt-pre diabetic-Dr Dario Guardian stated said pt   DVT (deep venous thrombosis) (HCC)    Over 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  Regular the workup has not been forthcoming) previously [facility]   Hepatic steatosis    Hypertension    Morbid obesity with BMI of 50.0-59.9, adult [facility])    Peripheral vascular disease (HCC)    Presence of IVC filter    Has 2 IVC filters with significant thrombus burden superior to the filter.   Pulmonary embolus (HCC) 12/2016   Initially just treated with anticoagulation, but in  February 2023 in April 2024 treated with EKOS thrombectomy for submassive PE.   Ventricular trigeminy    Weakness of both lower limbs     Medications:  Medications Prior to Admission  Medication Sig Dispense Refill Last Dose   albuterol (VENTOLIN HFA) 108 (90 Base) MCG/ACT inhaler Inhale 2 puffs into the lungs every 6 (six) hours as needed for wheezing or shortness of breath. 8 g 2 prn at unk   magnesium oxide (MAG-OX) 400 (240 Mg) MG tablet Take 400 mg by mouth daily.   Past Month   metoprolol succinate (TOPROL-XL) 25 MG 24 hr tablet Take 1 tablet (25 mg total) by mouth daily.   05/16/2023   sacubitril-valsartan (ENTRESTO) 24-26 MG Take 1 tablet by mouth 2 (two) times daily. 60 tablet 3 05/16/2023   furosemide (LASIX) 40 MG tablet Take 1 tablet (40 mg total) by mouth every other day. 30 tablet 0 05/12/2023   OXYGEN Inhale 1 L into the lungs daily.      tirzepatide [facility]) 5 MG/0.5ML Pen Inject 5 mg into the skin once a week. 6 mL 0 05/09/2023   warfarin (COUMADIN)  5 MG tablet Take 1 tablet (5 mg total) by mouth daily. 90 tablet 1 05/11/2023   Scheduled:   metoprolol succinate  25 mg Oral Daily   Warfarin - Pharmacist Dosing Inpatient   Does not apply q1600   Infusions:   heparin 2,200 Units/hr (05/20/23 0406)   PRN: acetaminophen **OR** acetaminophen, albuterol, Heparin (Porcine) in NaCl, iodixanol, lidocaine (PF), ondansetron **OR** ondansetron (ZOFRAN) IV, mouth rinse, senna-docusate Anti-infectives (From admission, onward)    Start     Dose/Rate Route Frequency Ordered Stop   05/17/23 1655  ceFAZolin (ANCEF) IVPB 2g/100 mL premix  Status:  Discontinued        2 g 200 mL/hr over 30 Minutes Intravenous 30 min pre-op 05/17/23 1655 05/17/23 1910       Assessment: 52 y.o. male who presents from wound care clinic with shortness of breath requiring 2 liters on nasal cannula oxygen at all times with oxygen saturations of 90% while on oxygen.  Upon workup patient was found to have a  pulmonary embolism in the right main pulmonary artery which she has had prior.  There is an extension of the clot to the right upper lobe.  There is also interval increase in the peripheral nonocclusive thrombus in the left lower lung pulmonary artery when compared to prior exam.  He is also noted to have a compressed left ventricle compatible with right heart failure/strain.  He also has dilatation of the main pulmonary trunk which is nonspecific but can be seen with pulmonary artery hypertension. Vascular surgery consulted and s/p thrombectomy. Patient is on warfarin 5 mg daily at home but on presentation pt's INR was subtherapeutic. No major DDI. 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 Limited data with DOACs.    7829 0157 HL 0.13  9/18 1004 HL 0.38  9/19 0422 HL 0.21 9/19 1157 HL 0.45  9/19 1802 HL 0.31 9/20 0347 HL 0.37    Date INR Warfarin Dose  9/18 1.3 7.5 mg   9/19 1.2 7.5 mg   9/20 1.2      Goal of Therapy:  INR 2-3 Monitor platelets by anticoagulation protocol: Yes   Plan:  Heparin:  Heparin level is therapeutic x 3. Will continue heparin infusion at 2200 units/hr. Recheck heparin level and CBC daily with AM labs while on heparin. Dayshift clinical RPh to F/U regarding warfarin dosing.  Warfarin:  INR is subtherapeutic. Will give warfarin 7.5 mg x 1 tonight (~50% increase from home dose), to help get INR therapeutic. Daily INR. CBC daily while on heparin. Stop heparin infusion once INR > 2.   Recommend possibly aiming for an INR goal of 2.5 -3.   Otelia Sergeant, PharmD, [facility] 05/20/2023 4:34 AM

## 2023-05-20 NOTE — TOC Progression Note (Signed)
Transition of Care [facility]) - Progression Note    Patient Details  Name: Ryan [name] MRN: [id number] Date of Birth: 12/18/1970  Transition of Care [facility]) CM/SW Contact  Truddie Hidden, RN Phone Number: 05/20/2023, 4:00 PM  Clinical Narrative:     TOC continuing to follow patient's progress throughout discharge planning.        Expected Discharge Plan and Services                                               Social Determinants of Health (SDOH) Interventions SDOH Screenings   Food Insecurity: No Food Insecurity (05/17/2023)  Housing: Low Risk  (05/17/2023)  Transportation Needs: No Transportation Needs (05/17/2023)  Utilities: Not At Risk (05/17/2023)  Depression (PHQ2-9): Low Risk  (05/03/2023)  Financial Resource Strain: Low Risk  (12/30/2022)  Physical Activity: Insufficiently Active (12/30/2022)  Social Connections: Unknown (12/30/2022)  Stress: No Stress Concern Present (12/30/2022)  Tobacco Use: Low Risk  (05/16/2023)    Readmission Risk Interventions     No data to display

## 2023-05-20 NOTE — Progress Notes (Signed)
PROGRESS NOTE    Ryan [name]  YQM:[id number] DOB: 1971-03-06 DOA: 05/16/2023 PCP: [name], NP    Brief Narrative:   Ryan [name] is a 52 y.o. male with history of morbid obesity, history of PE and DVT on warfarin, patient is status post 2 IVC filter placement, CKD stage IIIa, history of hemodialysis secondary to contrast nephropathy 8 years ago, history of pulmonary embolism thrombectomy in April 2024, chronic lymphedema, chronic hypoxemic respiratory failure on nocturnal oxygen at home, no diagnosis of sleep apnea and does not use CPAP at home per patient report.  He presented to the hospital because of elevated heart rate, shortness of breath and decreased oxygen saturation.  He said his oxygen saturation was 86% on room air and his heart rate was in the 120s.  These findings were similar to what he experienced when he had pulmonary embolism in the past.  He takes warfarin but he said he had not taken his warfarin since Wednesday, 05/11/2023, because he ran out off his warfarin and he could not get it refilled in time.   He was found to have acute pulmonary embolism, acute hypoxemic respiratory failure and acute kidney injury.   Assessment & Plan:   Principal Problem:   Pulmonary emboli (HCC) Active Problems:   Acute respiratory failure with hypoxia (HCC)   AKI (acute kidney injury) (HCC)   History of pulmonary embolism and DVT   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)   Essential hypertension   CKD (chronic kidney disease), stage IIIa   Morbid obesity with BMI of 50.0-59.9, adult (HCC)   Hepatic steatosis   Hyperlipidemia associated with type 2 diabetes mellitus (HCC)   Physical deconditioning   At risk for sleep apnea   Lymphadenopathy   Anticoagulation goal of INR 2 to 3   Bilateral pulmonary embolism (HCC)   Cor pulmonale (chronic) (HCC)  Acute pulmonary embolism in a patient with history of DVT and pulmonary embolism:  Status post mechanical  thrombectomy with vascular surgery on 9/17.  Tolerated procedure well.  On room air.  Discussed anticoagulation strategy with vascular surgery and hematology.  Patient has previously failed DOAC's so warfarin is recommended at this time. Plan: Continue warfarin.  Bridge switched to Lovenox per pharmacy.  Appreciate pharmacy dosing and warfarin assistance.  Patient will remain admitted until INR therapeutic.  Goal INR 2.5-3   Acute on chronic hypoxemic respiratory failure: He only uses 2 L/min oxygen at night but does not use any oxygen during the day.  He is currently on 2 L/min oxygen via [place].  Continue as needed     AKI: Renal function has approached baseline.  Hyponatremia: Mild and asymptomatic   Morbid obesity: This complicates overall care and prognosis.  He is at high risk for OSA and has been advised to follow-up with the pulmonologist for formal sleep testing.     Other comorbidities include cor pulmonale, morbid obesity, chronic left leg wound.   DVT prophylaxis: SQ Lovenox, coumadin Code Status: Full Family Communication: None Disposition Plan: Status is: Inpatient Remains inpatient appropriate because: Recurrent pulmonary with some status post mechanical thrombectomy.  On anticoagulation Coumadin with heparin bridge.   Level of care: Progressive  Consultants:  Vascular surgery  Procedures:  Pulmonary thrombectomy  Antimicrobials: None   Subjective: Seen and examined.  Resting comfortably in bed.  No visible distress  Objective: Vitals:   05/19/23 1938 05/19/23 2357 05/20/23 0351 05/20/23 0807  BP: 111/73 120/74 106/79 115/78  Pulse: 84 84 72 72  Resp: 19 19 16    Temp: 98.8 F (37.1 C) 98.5 F (36.9 C) 98.1 F (36.7 C) 97.9 F (36.6 C)  TempSrc:      SpO2: 95% 94% 94% 94%  Weight:      Height:        Intake/Output Summary (Last 24 hours) at 05/20/2023 1150 Last data filed at 05/20/2023 0100 Gross per 24 hour  Intake 120 ml  Output 675 ml  Net -555  ml   Filed Weights   05/16/23 0906  Weight: (!) 147.4 kg    Examination:  General exam: NAD Respiratory system: Lungs clear.  Normal work of breathing.  2 L Cardiovascular system: S1-S2 RRR, no murmurs, no pedal edema Gastrointestinal system: Obese, soft, NT/ND, normal bowel sounds Central nervous system: Alert and oriented. No focal neurological deficits. Extremities: Symmetric 5 x 5 power. Skin: No rashes, lesions or ulcers Psychiatry: Judgement and insight appear normal. Mood & affect appropriate.     Data Reviewed: I have personally reviewed following labs and imaging studies  CBC: Recent Labs  Lab 05/16/23 0911 05/17/23 0419 05/18/23 0157 05/19/23 0422 05/20/23 0347  WBC 4.4 5.2 5.0 4.1 4.1  HGB 16.8 15.8 14.3 13.9 13.3  HCT 49.7 47.6 43.4 42.8 40.5  MCV 89.4 90.2 91.6 91.1 91.4  PLT 163 164 145* 162 180   Basic Metabolic Panel: Recent Labs  Lab 05/16/23 0911 05/17/23 0419 05/18/23 0157 05/20/23 0347  NA 132* 132* 134* 136  K 3.8 3.9 3.8 3.6  CL 101 101 103 103  CO2 20* 21* 21* 24  GLUCOSE 113* 114* 118* 106*  BUN 19 22* 24* 18  CREATININE 1.77* 1.84* 1.43* 1.43*  CALCIUM 8.5* 8.1* 8.0* 8.1*   GFR: Estimated Creatinine Clearance: 83.3 mL/min (A) (by C-G formula based on SCr of 1.43 mg/dL (H)). Liver Function Tests: No results for input(s): "AST", "ALT", "ALKPHOS", "BILITOT", "PROT", "ALBUMIN" in the last 168 hours. No results for input(s): "LIPASE", "AMYLASE" in the last 168 hours. No results for input(s): "AMMONIA" in the last 168 hours. Coagulation Profile: Recent Labs  Lab 05/16/23 0958 05/17/23 0419 05/19/23 1157 05/20/23 0347  INR 1.2 1.3* 1.2 1.2   Cardiac Enzymes: No results for input(s): "CKTOTAL", "CKMB", "CKMBINDEX", "TROPONINI" in the last 168 hours. BNP (last 3 results) No results for input(s): "PROBNP" in the last 8760 hours. HbA1C: No results for input(s): "HGBA1C" in the last 72 hours. CBG: No results for input(s): "GLUCAP"  in the last 168 hours. Lipid Profile: No results for input(s): "CHOL", "HDL", "LDLCALC", "TRIG", "CHOLHDL", "LDLDIRECT" in the last 72 hours. Thyroid Function Tests: No results for input(s): "TSH", "T4TOTAL", "FREET4", "T3FREE", "THYROIDAB" in the last 72 hours. Anemia Panel: No results for input(s): "VITAMINB12", "FOLATE", "FERRITIN", "TIBC", "IRON", "RETICCTPCT" in the last 72 hours. Sepsis Labs: No results for input(s): "PROCALCITON", "LATICACIDVEN" in the last 168 hours.  No results found for this or any previous visit (from the past 240 hour(s)).       Radiology Studies: No results found.      Scheduled Meds:  enoxaparin (LOVENOX) injection  150 mg Subcutaneous Q12H   furosemide  40 mg Oral QODAY   metoprolol succinate  25 mg Oral Daily   warfarin  12 mg Oral ONCE-1600   Warfarin - Pharmacist Dosing Inpatient   Does not apply q1600   Continuous Infusions:     LOS: 4 days       Tresa Moore, MD [facility]  If 7PM-7AM, please contact night-coverage  05/20/2023, 11:50 AM

## 2023-05-20 NOTE — Progress Notes (Signed)
Pt refused CPAP therapy

## 2023-05-20 NOTE — Consult Note (Addendum)
ANTICOAGULATION CONSULT NOTE   Pharmacy Consult for warfarin + enoxaparin bridge Indication:  history of PE and DVT with a new PE.   Allergies  Allergen Reactions   Metrizamide Other (See Comments)    Kidney failure requiring dialysis Kidney failure requiring dialysis    Clindamycin/Lincomycin Rash   Lincomycin Rash   Sulfa Antibiotics Rash    Patient Measurements: Height: 5\' 7"  (170.2 cm) Weight: (!) 147.4 kg (325 lb) IBW/kg (Calculated) : 66.1 Heparin Dosing Weight: 102.1 kg   Vital Signs: Temp: 98.1 F (36.7 C) (09/20 0351) BP: 106/79 (09/20 0351) Pulse Rate: 72 (09/20 0351)  Labs: Recent Labs    05/18/23 0157 05/18/23 1004 05/19/23 0422 05/19/23 1157 05/19/23 1742 05/20/23 0347  HGB 14.3  --  13.9  --   --  13.3  HCT 43.4  --  42.8  --   --  40.5  PLT 145*  --  162  --   --  180  LABPROT  --   --   --  15.1  --  14.9  INR  --   --   --  1.2  --  1.2  HEPARINUNFRC 0.13*   < > 0.21* 0.45 0.31 0.37  CREATININE 1.43*  --   --   --   --  1.43*   < > = values in this interval not displayed.    Estimated Creatinine Clearance: 83.3 mL/min (A) (by C-G formula based on SCr of 1.43 mg/dL (H)).   Medical History: Past Medical History:  Diagnosis Date   (HFpEF) heart failure with preserved ejection fraction (HCC)    Arrhythmia    atrial fibrillation   Diabetes mellitus type II, non insulin dependent (HCC)    Diabetes mellitus without complication (HCC)    per pt-pre diabetic-Dr Dario Guardian stated said pt   DVT (deep venous thrombosis) (HCC)    Over 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  Regular the workup has not been forthcoming) previously [facility] at [facility]   Hepatic steatosis    Hypertension    Morbid obesity with BMI of 50.0-59.9, adult [facility])    Peripheral vascular disease (HCC)    Presence of IVC filter    Has 2 IVC filters with significant thrombus burden superior to the filter.   Pulmonary embolus (HCC) 12/2016   Initially just treated with anticoagulation, but in  February 2023 in April 2024 treated with EKOS thrombectomy for submassive PE.   Ventricular trigeminy    Weakness of both lower limbs     Medications:  Medications Prior to Admission  Medication Sig Dispense Refill Last Dose   albuterol (VENTOLIN HFA) 108 (90 Base) MCG/ACT inhaler Inhale 2 puffs into the lungs every 6 (six) hours as needed for wheezing or shortness of breath. 8 g 2 prn at unk   magnesium oxide (MAG-OX) 400 (240 Mg) MG tablet Take 400 mg by mouth daily.   Past Month   metoprolol succinate (TOPROL-XL) 25 MG 24 hr tablet Take 1 tablet (25 mg total) by mouth daily.   05/16/2023   sacubitril-valsartan (ENTRESTO) 24-26 MG Take 1 tablet by mouth 2 (two) times daily. 60 tablet 3 05/16/2023   furosemide (LASIX) 40 MG tablet Take 1 tablet (40 mg total) by mouth every other day. 30 tablet 0 05/12/2023   OXYGEN Inhale 1 L into the lungs daily.      tirzepatide [facility]) 5 MG/0.5ML Pen Inject 5 mg into the skin once a week. 6 mL 0 05/09/2023   warfarin (COUMADIN)  5 MG tablet Take 1 tablet (5 mg total) by mouth daily. 90 tablet 1 05/11/2023   Scheduled:   metoprolol succinate  25 mg Oral Daily   Warfarin - Pharmacist Dosing Inpatient   Does not apply q1600   Infusions:   heparin 2,200 Units/hr (05/20/23 0406)   PRN: acetaminophen **OR** acetaminophen, albuterol, Heparin (Porcine) in NaCl, iodixanol, lidocaine (PF), ondansetron **OR** ondansetron (ZOFRAN) IV, mouth rinse, senna-docusate Anti-infectives (From admission, onward)    Start     Dose/Rate Route Frequency Ordered Stop   05/17/23 1655  ceFAZolin (ANCEF) IVPB 2g/100 mL premix  Status:  Discontinued        2 g 200 mL/hr over 30 Minutes Intravenous 30 min pre-op 05/17/23 1655 05/17/23 1910       Assessment: 52 y.o. male who presents from wound care clinic with shortness of breath requiring 2 liters on nasal cannula oxygen at all times with oxygen saturations of 90% while on oxygen.  Upon workup patient was found to have a  pulmonary embolism in the right main pulmonary artery which she has had prior.  There is an extension of the clot to the right upper lobe.  There is also interval increase in the peripheral nonocclusive thrombus in the left lower lung pulmonary artery when compared to prior exam.  He is also noted to have a compressed left ventricle compatible with right heart failure/strain.  He also has dilatation of the main pulmonary trunk which is nonspecific but can be seen with pulmonary artery hypertension. Vascular surgery consulted and s/p thrombectomy. Patient is on warfarin 5 mg daily at home but on presentation pt's INR was subtherapeutic. No major DDI. 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 Limited data with DOACs.    0918 0157 HL 0.13  9/18 1004 HL 0.38  9/19 0422 HL 0.21 9/19 1157 HL 0.45     Date INR Warfarin Dose  9/18 1.3 7.5 mg   9/19 1.2 7.5 mg   9/20 1.2 12 mg      Goal of Therapy:  INR 2-3 Monitor platelets by anticoagulation protocol: Yes   Plan:  Heparin:  Switching heparin to enoxaparin.    Warfarin:  INR is subtherapeutic. Will give warfarin 10 mg x 1 tonight (~140% increase from home dose), to help get INR therapeutic. Daily INR. CBC daily while on heparin. Stop heparin infusion once INR > 2.   Recommend possibly aiming for an INR goal of 2.5 -3.    Ronnald Ramp, PharmD, BCPS 05/20/2023,8:04 AM

## 2023-05-21 DIAGNOSIS — I2699 Other pulmonary embolism without acute cor pulmonale: Secondary | ICD-10-CM | POA: Diagnosis not present

## 2023-05-21 LAB — CBC
HCT: 40.2 % (ref 39.0–52.0)
Hemoglobin: 13.5 g/dL (ref 13.0–17.0)
MCH: 29.9 pg (ref 26.0–34.0)
MCHC: 33.6 g/dL (ref 30.0–36.0)
MCV: 88.9 fL (ref 80.0–100.0)
Platelets: 222 10*3/uL (ref 150–400)
RBC: 4.52 MIL/uL (ref 4.22–5.81)
RDW: 15.5 % (ref 11.5–15.5)
WBC: 3.9 10*3/uL — ABNORMAL LOW (ref 4.0–10.5)
nRBC: 0 % (ref 0.0–0.2)

## 2023-05-21 LAB — HEPARIN ANTI-XA: Heparin LMW: 0.91 IU/mL

## 2023-05-21 LAB — PROTIME-INR
INR: 1.5 — ABNORMAL HIGH (ref 0.8–1.2)
Prothrombin Time: 18.5 seconds — ABNORMAL HIGH (ref 11.4–15.2)

## 2023-05-21 MED ORDER — WARFARIN SODIUM 6 MG PO TABS
12.0000 mg | ORAL_TABLET | Freq: Once | ORAL | Status: AC
  Administered 2023-05-21: 12 mg via ORAL
  Filled 2023-05-21: qty 2

## 2023-05-21 NOTE — Consult Note (Signed)
ANTICOAGULATION CONSULT NOTE   Pharmacy Consult for warfarin + enoxaparin bridge Indication:  history of PE and DVT with a new PE.   Allergies  Allergen Reactions   Metrizamide Other (See Comments)    Kidney failure requiring dialysis Kidney failure requiring dialysis    Clindamycin/Lincomycin Rash   Lincomycin Rash   Sulfa Antibiotics Rash    Patient Measurements: Height: 5\' 7"  (170.2 cm) Weight: (!) 147.4 kg (325 lb) IBW/kg (Calculated) : 66.1 Heparin Dosing Weight: 102.1 kg   Vital Signs: Temp: 98.2 F (36.8 C) (09/21 1437) BP: 130/83 (09/21 1437) Pulse Rate: 83 (09/21 1437)  Labs: Recent Labs    05/19/23 0422 05/19/23 1157 05/19/23 1742 05/20/23 0347 05/21/23 0534 05/21/23 1402  HGB 13.9  --   --  13.3 13.5  --   HCT 42.8  --   --  40.5 40.2  --   PLT 162  --   --  180 222  --   LABPROT  --  15.1  --  14.9 18.5*  --   INR  --  1.2  --  1.2 1.5*  --   HEPARINUNFRC 0.21* 0.45 0.31 0.37  --   --   HEPRLOWMOCWT  --   --   --   --   --  0.91  CREATININE  --   --   --  1.43*  --   --     Estimated Creatinine Clearance: 83.3 mL/min (A) (by C-G formula based on SCr of 1.43 mg/dL (H)).   Medical History: Past Medical History:  Diagnosis Date   (HFpEF) heart failure with preserved ejection fraction (HCC)    Arrhythmia    atrial fibrillation   Diabetes mellitus type II, non insulin dependent (HCC)    Diabetes mellitus without complication (HCC)    per pt-pre diabetic-Dr Dario Guardian stated said pt   DVT (deep venous thrombosis) (HCC)    Over 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  Regular the workup has not been forthcoming) previously followed by Dr. Isaiah Serge at [facility]   Hepatic steatosis    Hypertension    Morbid obesity with BMI of 50.0-59.9, adult [facility])    Peripheral vascular disease (HCC)    Presence of IVC filter    Has 2 IVC filters with significant thrombus burden superior to the filter.   Pulmonary embolus (HCC) 12/2016   Initially just treated with anticoagulation, but in February  2023 in April 2024 treated with EKOS thrombectomy for submassive PE.   Ventricular trigeminy    Weakness of both lower limbs     Medications:  Medications Prior to Admission  Medication Sig Dispense Refill Last Dose   albuterol (VENTOLIN HFA) 108 (90 Base) MCG/ACT inhaler Inhale 2 puffs into the lungs every 6 (six) hours as needed for wheezing or shortness of breath. 8 g 2 prn at unk   magnesium oxide (MAG-OX) 400 (240 Mg) MG tablet Take 400 mg by mouth daily.   Past Month   metoprolol succinate (TOPROL-XL) 25 MG 24 hr tablet Take 1 tablet (25 mg total) by mouth daily.   05/16/2023   sacubitril-valsartan (ENTRESTO) 24-26 MG Take 1 tablet by mouth 2 (two) times daily. 60 tablet 3 05/16/2023   furosemide (LASIX) 40 MG tablet Take 1 tablet (40 mg total) by mouth every other day. 30 tablet 0 05/12/2023   OXYGEN Inhale 1 L into the lungs daily.      tirzepatide [facility]) 5 MG/0.5ML Pen Inject 5 mg into the skin once a week. 6 mL  0 05/09/2023   warfarin (COUMADIN) 5 MG tablet Take 1 tablet (5 mg total) by mouth daily. 90 tablet 1 05/11/2023   Scheduled:   enoxaparin (LOVENOX) injection  150 mg Subcutaneous Q12H   furosemide  40 mg Oral QODAY   metoprolol succinate  25 mg Oral Daily   warfarin  12 mg Oral ONCE-1600   Warfarin - Pharmacist Dosing Inpatient   Does not apply q1600   Infusions:    PRN: acetaminophen **OR** acetaminophen, albuterol, Heparin (Porcine) in NaCl, iodixanol, lidocaine (PF), ondansetron **OR** ondansetron (ZOFRAN) IV, mouth rinse, senna-docusate Anti-infectives (From admission, onward)    Start     Dose/Rate Route Frequency Ordered Stop   05/17/23 1655  ceFAZolin (ANCEF) IVPB 2g/100 mL premix  Status:  Discontinued        2 g 200 mL/hr over 30 Minutes Intravenous 30 min pre-op 05/17/23 1655 05/17/23 1910       Assessment: 52 y.o. male who presents from wound care clinic with shortness of breath requiring 2 liters on nasal cannula oxygen at all times with oxygen  saturations of 90% while on oxygen.  Upon workup patient was found to have a pulmonary embolism in the right main pulmonary artery which she has had prior.  There is an extension of the clot to the right upper lobe.  There is also interval increase in the peripheral nonocclusive thrombus in the left lower lung pulmonary artery when compared to prior exam.  He is also noted to have a compressed left ventricle compatible with right heart failure/strain.  He also has dilatation of the main pulmonary trunk which is nonspecific but can be seen with pulmonary artery hypertension. Vascular surgery consulted and s/p thrombectomy. Patient is on warfarin 5 mg daily at home but on presentation pt's INR was subtherapeutic. No major DDI. 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 Limited data with DOACs.    0918 0157 HL 0.13  9/18 1004 HL 0.38  9/19 0422 HL 0.21 9/19 1157 HL 0.45   9/21 1400 peak LMWH = 0.91, therapeutic   **transitioned to enoxaparin bridge currently**    Date INR Warfarin Dose  9/18 1.3 7.5 mg   9/19 1.2 7.5 mg   9/20 1.2 12 mg   9/21 1.5 12 mg     Goal of Therapy:  INR 2-3 Monitor platelets by anticoagulation protocol: Yes   Plan: Warfarin:  INR is subtherapeutic. Will give warfarin 12 mg x 1again tonight (~140% increase from home dose), to help get INR therapeutic. Daily INR. CBC daily while on heparin. Stop heparin infusion once INR > 2.   Recommend possibly aiming for an INR goal of 2.5 -3.   Enoxaparin: Continue with enoxaparin 150 mg SQ BID   Sharen Hones, PharmD, BCPS Clinical Pharmacist   05/21/2023 3:13 PM

## 2023-05-21 NOTE — Consult Note (Signed)
ANTICOAGULATION CONSULT NOTE   Pharmacy Consult for warfarin + enoxaparin bridge Indication:  history of PE and DVT with a new PE.   Allergies  Allergen Reactions   Metrizamide Other (See Comments)    Kidney failure requiring dialysis Kidney failure requiring dialysis    Clindamycin/Lincomycin Rash   Lincomycin Rash   Sulfa Antibiotics Rash    Patient Measurements: Height: 5\' 7"  (170.2 cm) Weight: (!) 147.4 kg (325 lb) IBW/kg (Calculated) : 66.1 Heparin Dosing Weight: 102.1 kg   Vital Signs: Temp: 98.4 F (36.9 C) (09/21 0439) BP: 123/78 (09/21 0439) Pulse Rate: 73 (09/21 0439)  Labs: Recent Labs    05/19/23 0422 05/19/23 1157 05/19/23 1742 05/20/23 0347 05/21/23 0534  HGB 13.9  --   --  13.3 13.5  HCT 42.8  --   --  40.5 40.2  PLT 162  --   --  180 222  LABPROT  --  15.1  --  14.9 18.5*  INR  --  1.2  --  1.2 1.5*  HEPARINUNFRC 0.21* 0.45 0.31 0.37  --   CREATININE  --   --   --  1.43*  --     Estimated Creatinine Clearance: 83.3 mL/min (A) (by C-G formula based on SCr of 1.43 mg/dL (H)).   Medical History: Past Medical History:  Diagnosis Date   (HFpEF) heart failure with preserved ejection fraction (HCC)    Arrhythmia    atrial fibrillation   Diabetes mellitus type II, non insulin dependent (HCC)    Diabetes mellitus without complication (HCC)    per pt-pre diabetic-Dr Dario Guardian stated said pt   DVT (deep venous thrombosis) (HCC)    Over 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  Regular the workup has not been forthcoming) previously followed by Dr. Isaiah Serge at [facility]   Hepatic steatosis    Hypertension    Morbid obesity with BMI of 50.0-59.9, adult [facility])    Peripheral vascular disease (HCC)    Presence of IVC filter    Has 2 IVC filters with significant thrombus burden superior to the filter.   Pulmonary embolus (HCC) 12/2016   Initially just treated with anticoagulation, but in February 2023 in April 2024 treated with EKOS thrombectomy for submassive PE.   Ventricular trigeminy     Weakness of both lower limbs     Medications:  Medications Prior to Admission  Medication Sig Dispense Refill Last Dose   albuterol (VENTOLIN HFA) 108 (90 Base) MCG/ACT inhaler Inhale 2 puffs into the lungs every 6 (six) hours as needed for wheezing or shortness of breath. 8 g 2 prn at unk   magnesium oxide (MAG-OX) 400 (240 Mg) MG tablet Take 400 mg by mouth daily.   Past Month   metoprolol succinate (TOPROL-XL) 25 MG 24 hr tablet Take 1 tablet (25 mg total) by mouth daily.   05/16/2023   sacubitril-valsartan (ENTRESTO) 24-26 MG Take 1 tablet by mouth 2 (two) times daily. 60 tablet 3 05/16/2023   furosemide (LASIX) 40 MG tablet Take 1 tablet (40 mg total) by mouth every other day. 30 tablet 0 05/12/2023   OXYGEN Inhale 1 L into the lungs daily.      tirzepatide [facility]) 5 MG/0.5ML Pen Inject 5 mg into the skin once a week. 6 mL 0 05/09/2023   warfarin (COUMADIN) 5 MG tablet Take 1 tablet (5 mg total) by mouth daily. 90 tablet 1 05/11/2023   Scheduled:   enoxaparin (LOVENOX) injection  150 mg Subcutaneous Q12H   furosemide  40 mg  Oral QODAY   metoprolol succinate  25 mg Oral Daily   Warfarin - Pharmacist Dosing Inpatient   Does not apply q1600   Infusions:    PRN: acetaminophen **OR** acetaminophen, albuterol, Heparin (Porcine) in NaCl, iodixanol, lidocaine (PF), ondansetron **OR** ondansetron (ZOFRAN) IV, mouth rinse, senna-docusate Anti-infectives (From admission, onward)    Start     Dose/Rate Route Frequency Ordered Stop   05/17/23 1655  ceFAZolin (ANCEF) IVPB 2g/100 mL premix  Status:  Discontinued        2 g 200 mL/hr over 30 Minutes Intravenous 30 min pre-op 05/17/23 1655 05/17/23 1910       Assessment: 52 y.o. male who presents from wound care clinic with shortness of breath requiring 2 liters on nasal cannula oxygen at all times with oxygen saturations of 90% while on oxygen.  Upon workup patient was found to have a pulmonary embolism in the right main pulmonary artery  which she has had prior.  There is an extension of the clot to the right upper lobe.  There is also interval increase in the peripheral nonocclusive thrombus in the left lower lung pulmonary artery when compared to prior exam.  He is also noted to have a compressed left ventricle compatible with right heart failure/strain.  He also has dilatation of the main pulmonary trunk which is nonspecific but can be seen with pulmonary artery hypertension. Vascular surgery consulted and s/p thrombectomy. Patient is on warfarin 5 mg daily at home but on presentation pt's INR was subtherapeutic. No major DDI. 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 Limited data with DOACs.    0918 0157 HL 0.13  9/18 1004 HL 0.38  9/19 0422 HL 0.21 9/19 1157 HL 0.45   **transitioned to enoxaparin bridge currently**    Date INR Warfarin Dose  9/18 1.3 7.5 mg   9/19 1.2 7.5 mg   9/20 1.2 12 mg   9/21 1.5      Goal of Therapy:  INR 2-3 Monitor platelets by anticoagulation protocol: Yes   Plan: Warfarin:  INR is subtherapeutic. Will give warfarin 12 mg x 1again tonight (~140% increase from home dose), to help get INR therapeutic. Daily INR. CBC daily while on heparin. Stop heparin infusion once INR > 2.   Recommend possibly aiming for an INR goal of 2.5 -3.    Bettey Costa, PharmD Clinical Pharmacist 05/21/2023 8:53 AM

## 2023-05-21 NOTE — Plan of Care (Signed)

## 2023-05-21 NOTE — Progress Notes (Signed)
PROGRESS NOTE    Ryan [name]  ZOX:[id number] DOB: 10-Apr-1971 DOA: 05/16/2023 PCP: [name], NP    Brief Narrative:   Ryan [name] is a 52 y.o. male with history of morbid obesity, history of PE and DVT on warfarin, patient is status post 2 IVC filter placement, CKD stage IIIa, history of hemodialysis secondary to contrast nephropathy 8 years ago, history of pulmonary embolism thrombectomy in April 2024, chronic lymphedema, chronic hypoxemic respiratory failure on nocturnal oxygen at home, no diagnosis of sleep apnea and does not use CPAP at home per patient report.  He presented to the hospital because of elevated heart rate, shortness of breath and decreased oxygen saturation.  He said his oxygen saturation was 86% on room air and his heart rate was in the 120s.  These findings were similar to what he experienced when he had pulmonary embolism in the past.  He takes warfarin but he said he had not taken his warfarin since Wednesday, 05/11/2023, because he ran out off his warfarin and he could not get it refilled in time.   He was found to have acute pulmonary embolism, acute hypoxemic respiratory failure and acute kidney injury.   Assessment & Plan:   Principal Problem:   Pulmonary emboli (HCC) Active Problems:   Acute respiratory failure with hypoxia (HCC)   AKI (acute kidney injury) (HCC)   History of pulmonary embolism and DVT   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)   Essential hypertension   CKD (chronic kidney disease), stage IIIa   Morbid obesity with BMI of 50.0-59.9, adult (HCC)   Hepatic steatosis   Hyperlipidemia associated with type 2 diabetes mellitus (HCC)   Physical deconditioning   At risk for sleep apnea   Lymphadenopathy   Anticoagulation goal of INR 2 to 3   Bilateral pulmonary embolism (HCC)   Cor pulmonale (chronic) (HCC)  Acute pulmonary embolism in a patient with history of DVT and pulmonary embolism:  Status post mechanical  thrombectomy with vascular surgery on 9/17.  Tolerated procedure well.  On room air.  Discussed anticoagulation strategy with vascular surgery and hematology.  Patient has previously failed DOAC's so warfarin is recommended at this time. Plan: Continue warfarin.  Bridge switched to Lovenox per pharmacy.  Appreciate pharmacy dosing and warfarin assistance.  Patient will remain admitted until INR therapeutic.  Goal INR 2.5-3.  Currently INR 1.5  Acute on chronic hypoxemic respiratory failure: He only uses 2 L/min oxygen at night but does not use any oxygen during the day.  He is currently on 2 L/min oxygen via [place].  Continue as needed     AKI: Renal function has approached baseline.  Check periodic lab work  Hyponatremia: Mild and asymptomatic   Morbid obesity: This complicates overall care and prognosis.  He is at high risk for OSA and has been advised to follow-up with the pulmonologist for formal sleep testing.     Other comorbidities include cor pulmonale, morbid obesity, chronic left leg wound.   DVT prophylaxis: SQ Lovenox, coumadin Code Status: Full Family Communication: None Disposition Plan: Status is: Inpatient Remains inpatient appropriate because: Recurrent pulmonary with some status post mechanical thrombectomy.  On anticoagulation Coumadin with heparin bridge.   Level of care: Progressive  Consultants:  Vascular surgery  Procedures:  Pulmonary thrombectomy  Antimicrobials: None   Subjective: Seen and examined.  Sitting up on edge of bed.  Comfortable.  No distress  Objective: Vitals:   05/20/23 2208 05/21/23 0033  05/21/23 0439 05/21/23 1019  BP:  119/80 123/78 120/83  Pulse:  82 73 91  Resp:  18 18 (!) 22  Temp:  97.8 F (36.6 C) 98.4 F (36.9 C) 97.8 F (36.6 C)  TempSrc:      SpO2: 95% 97% 95% 97%  Weight:      Height:        Intake/Output Summary (Last 24 hours) at 05/21/2023 1306 Last data filed at 05/21/2023 0033 Gross per 24 hour  Intake 240 ml   Output 620 ml  Net -380 ml   Filed Weights   05/16/23 0906  Weight: (!) 147.4 kg    Examination:  General exam: No acute distress Respiratory system: Lung sounds diminished to bases.  Normal work of breathing.  2 L Cardiovascular system: S1-S2 RRR, no murmurs, no pedal edema Gastrointestinal system: Obese, soft, NT/ND, normal bowel sounds Central nervous system: Alert and oriented. No focal neurological deficits. Extremities: Symmetric 5 x 5 power. Skin: No rashes, lesions or ulcers Psychiatry: Judgement and insight appear normal. Mood & affect appropriate.     Data Reviewed: I have personally reviewed following labs and imaging studies  CBC: Recent Labs  Lab 05/17/23 0419 05/18/23 0157 05/19/23 0422 05/20/23 0347 05/21/23 0534  WBC 5.2 5.0 4.1 4.1 3.9*  HGB 15.8 14.3 13.9 13.3 13.5  HCT 47.6 43.4 42.8 40.5 40.2  MCV 90.2 91.6 91.1 91.4 88.9  PLT 164 145* 162 180 222   Basic Metabolic Panel: Recent Labs  Lab 05/16/23 0911 05/17/23 0419 05/18/23 0157 05/20/23 0347  NA 132* 132* 134* 136  K 3.8 3.9 3.8 3.6  CL 101 101 103 103  CO2 20* 21* 21* 24  GLUCOSE 113* 114* 118* 106*  BUN 19 22* 24* 18  CREATININE 1.77* 1.84* 1.43* 1.43*  CALCIUM 8.5* 8.1* 8.0* 8.1*   GFR: Estimated Creatinine Clearance: 83.3 mL/min (A) (by C-G formula based on SCr of 1.43 mg/dL (H)). Liver Function Tests: No results for input(s): "AST", "ALT", "ALKPHOS", "BILITOT", "PROT", "ALBUMIN" in the last 168 hours. No results for input(s): "LIPASE", "AMYLASE" in the last 168 hours. No results for input(s): "AMMONIA" in the last 168 hours. Coagulation Profile: Recent Labs  Lab 05/16/23 0958 05/17/23 0419 05/19/23 1157 05/20/23 0347 05/21/23 0534  INR 1.2 1.3* 1.2 1.2 1.5*   Cardiac Enzymes: No results for input(s): "CKTOTAL", "CKMB", "CKMBINDEX", "TROPONINI" in the last 168 hours. BNP (last 3 results) No results for input(s): "PROBNP" in the last 8760 hours. HbA1C: No results for  input(s): "HGBA1C" in the last 72 hours. CBG: No results for input(s): "GLUCAP" in the last 168 hours. Lipid Profile: No results for input(s): "CHOL", "HDL", "LDLCALC", "TRIG", "CHOLHDL", "LDLDIRECT" in the last 72 hours. Thyroid Function Tests: No results for input(s): "TSH", "T4TOTAL", "FREET4", "T3FREE", "THYROIDAB" in the last 72 hours. Anemia Panel: No results for input(s): "VITAMINB12", "FOLATE", "FERRITIN", "TIBC", "IRON", "RETICCTPCT" in the last 72 hours. Sepsis Labs: No results for input(s): "PROCALCITON", "LATICACIDVEN" in the last 168 hours.  No results found for this or any previous visit (from the past 240 hour(s)).       Radiology Studies: No results found.      Scheduled Meds:  enoxaparin (LOVENOX) injection  150 mg Subcutaneous Q12H   furosemide  40 mg Oral QODAY   metoprolol succinate  25 mg Oral Daily   warfarin  12 mg Oral ONCE-1600   Warfarin - Pharmacist Dosing Inpatient   Does not apply q1600   Continuous Infusions:  LOS: 5 days       Tresa Moore, MD [facility]   If 7PM-7AM, please contact night-coverage  05/21/2023, 1:06 PM

## 2023-05-22 DIAGNOSIS — I2699 Other pulmonary embolism without acute cor pulmonale: Secondary | ICD-10-CM | POA: Diagnosis not present

## 2023-05-22 LAB — PROTIME-INR
INR: 2.1 — ABNORMAL HIGH (ref 0.8–1.2)
Prothrombin Time: 23.7 seconds — ABNORMAL HIGH (ref 11.4–15.2)

## 2023-05-22 MED ORDER — WARFARIN SODIUM 7.5 MG PO TABS
7.5000 mg | ORAL_TABLET | Freq: Once | ORAL | Status: AC
  Administered 2023-05-22: 7.5 mg via ORAL
  Filled 2023-05-22: qty 1

## 2023-05-22 NOTE — Progress Notes (Signed)
PROGRESS NOTE    Ryan [name]  MVH:[id number] DOB: Nov 25, 1970 DOA: 05/16/2023 PCP: [name], NP    Brief Narrative:   Ryan [name] is a 52 y.o. male with history of morbid obesity, history of PE and DVT on warfarin, patient is status post 2 IVC filter placement, CKD stage IIIa, history of hemodialysis secondary to contrast nephropathy 8 years ago, history of pulmonary embolism thrombectomy in April 2024, chronic lymphedema, chronic hypoxemic respiratory failure on nocturnal oxygen at home, no diagnosis of sleep apnea and does not use CPAP at home per patient report.  He presented to the hospital because of elevated heart rate, shortness of breath and decreased oxygen saturation.  He said his oxygen saturation was 86% on room air and his heart rate was in the 120s.  These findings were similar to what he experienced when he had pulmonary embolism in the past.  He takes warfarin but he said he had not taken his warfarin since Wednesday, 05/11/2023, because he ran out off his warfarin and he could not get it refilled in time.   He was found to have acute pulmonary embolism, acute hypoxemic respiratory failure and acute kidney injury.   Assessment & Plan:   Principal Problem:   Pulmonary emboli (HCC) Active Problems:   Acute respiratory failure with hypoxia (HCC)   AKI (acute kidney injury) (HCC)   History of pulmonary embolism and DVT   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)   Essential hypertension   CKD (chronic kidney disease), stage IIIa   Morbid obesity with BMI of 50.0-59.9, adult (HCC)   Hepatic steatosis   Hyperlipidemia associated with type 2 diabetes mellitus (HCC)   Physical deconditioning   At risk for sleep apnea   Lymphadenopathy   Anticoagulation goal of INR 2 to 3   Bilateral pulmonary embolism (HCC)   Cor pulmonale (chronic) (HCC)  Acute pulmonary embolism in a patient with history of DVT and pulmonary embolism:  Status post mechanical  thrombectomy with vascular surgery on 9/17.  Tolerated procedure well.  On room air.  Discussed anticoagulation strategy with vascular surgery and hematology.  Patient has previously failed DOAC's so warfarin is recommended at this time. Plan: Continue warfarin.  Continue Lovenox bridge.  Appreciate pharmacy dosing assistance.  Current INR 2.1.  Goal INR 2.5-3.  Acute on chronic hypoxemic respiratory failure: He only uses 2 L/min oxygen at night but does not use any oxygen during the day.  He is currently on 2 L/min oxygen via [place].  Continue as needed     AKI: Renal function has approached baseline.  Monitor renal function  Hyponatremia: Mild and asymptomatic   Morbid obesity: This complicates overall care and prognosis.  He is at high risk for OSA and has been advised to follow-up with the pulmonologist for formal sleep testing.     Other comorbidities include cor pulmonale, morbid obesity, chronic left leg wound.   DVT prophylaxis: SQ Lovenox, coumadin Code Status: Full Family Communication: None Disposition Plan: Status is: Inpatient Remains inpatient appropriate because: Recurrent pulmonary with some status post mechanical thrombectomy.  On anticoagulation Coumadin with heparin bridge.   Level of care: Progressive  Consultants:  Vascular surgery  Procedures:  Pulmonary thrombectomy  Antimicrobials: None   Subjective: And examined.  Sitting up on edge of bed.  Comfortable.  No distress.  Objective: Vitals:   05/21/23 2349 05/22/23 0426 05/22/23 0814 05/22/23 0815  BP: (!) 151/99 (!) 118/90 133/79 133/79  Pulse: 80  78 79 78  Resp: 19 20  16   Temp: 98.1 F (36.7 C) 98.1 F (36.7 C)  98.6 F (37 C)  TempSrc: Oral Oral  Oral  SpO2: 93% 93%  96%  Weight:      Height:        Intake/Output Summary (Last 24 hours) at 05/22/2023 1206 Last data filed at 05/22/2023 1100 Gross per 24 hour  Intake 840 ml  Output 400 ml  Net 440 ml   Filed Weights   05/16/23 0906   Weight: (!) 147.4 kg    Examination:  General exam: NAD Respiratory system: Lung sounds diminished at bases.  Normal work of breathing.  2 L Cardiovascular system: S1-S2 RRR, no murmurs, no pedal edema Gastrointestinal system: Obese, soft, NT/ND, normal bowel sounds Central nervous system: Alert and oriented. No focal neurological deficits. Extremities: Symmetric 5 x 5 power. Skin: No rashes, lesions or ulcers Psychiatry: Judgement and insight appear normal. Mood & affect appropriate.     Data Reviewed: I have personally reviewed following labs and imaging studies  CBC: Recent Labs  Lab 05/17/23 0419 05/18/23 0157 05/19/23 0422 05/20/23 0347 05/21/23 0534  WBC 5.2 5.0 4.1 4.1 3.9*  HGB 15.8 14.3 13.9 13.3 13.5  HCT 47.6 43.4 42.8 40.5 40.2  MCV 90.2 91.6 91.1 91.4 88.9  PLT 164 145* 162 180 222   Basic Metabolic Panel: Recent Labs  Lab 05/16/23 0911 05/17/23 0419 05/18/23 0157 05/20/23 0347  NA 132* 132* 134* 136  K 3.8 3.9 3.8 3.6  CL 101 101 103 103  CO2 20* 21* 21* 24  GLUCOSE 113* 114* 118* 106*  BUN 19 22* 24* 18  CREATININE 1.77* 1.84* 1.43* 1.43*  CALCIUM 8.5* 8.1* 8.0* 8.1*   GFR: Estimated Creatinine Clearance: 83.3 mL/min (A) (by C-G formula based on SCr of 1.43 mg/dL (H)). Liver Function Tests: No results for input(s): "AST", "ALT", "ALKPHOS", "BILITOT", "PROT", "ALBUMIN" in the last 168 hours. No results for input(s): "LIPASE", "AMYLASE" in the last 168 hours. No results for input(s): "AMMONIA" in the last 168 hours. Coagulation Profile: Recent Labs  Lab 05/17/23 0419 05/19/23 1157 05/20/23 0347 05/21/23 0534 05/22/23 0433  INR 1.3* 1.2 1.2 1.5* 2.1*   Cardiac Enzymes: No results for input(s): "CKTOTAL", "CKMB", "CKMBINDEX", "TROPONINI" in the last 168 hours. BNP (last 3 results) No results for input(s): "PROBNP" in the last 8760 hours. HbA1C: No results for input(s): "HGBA1C" in the last 72 hours. CBG: No results for input(s):  "GLUCAP" in the last 168 hours. Lipid Profile: No results for input(s): "CHOL", "HDL", "LDLCALC", "TRIG", "CHOLHDL", "LDLDIRECT" in the last 72 hours. Thyroid Function Tests: No results for input(s): "TSH", "T4TOTAL", "FREET4", "T3FREE", "THYROIDAB" in the last 72 hours. Anemia Panel: No results for input(s): "VITAMINB12", "FOLATE", "FERRITIN", "TIBC", "IRON", "RETICCTPCT" in the last 72 hours. Sepsis Labs: No results for input(s): "PROCALCITON", "LATICACIDVEN" in the last 168 hours.  No results found for this or any previous visit (from the past 240 hour(s)).       Radiology Studies: No results found.      Scheduled Meds:  enoxaparin (LOVENOX) injection  150 mg Subcutaneous Q12H   furosemide  40 mg Oral QODAY   metoprolol succinate  25 mg Oral Daily   Warfarin - Pharmacist Dosing Inpatient   Does not apply q1600   Continuous Infusions:     LOS: 6 days       Tresa Moore, MD [facility]   If 7PM-7AM, please contact night-coverage  05/22/2023, 12:06 PM

## 2023-05-22 NOTE — Consult Note (Signed)
ANTICOAGULATION CONSULT NOTE   Pharmacy Consult for warfarin + enoxaparin bridge Indication:  history of PE and DVT with a new PE.   Allergies  Allergen Reactions   Metrizamide Other (See Comments)    Kidney failure requiring dialysis Kidney failure requiring dialysis    Clindamycin/Lincomycin Rash   Lincomycin Rash   Sulfa Antibiotics Rash    Patient Measurements: Height: 5\' 7"  (170.2 cm) Weight: (!) 147.4 kg (325 lb) IBW/kg (Calculated) : 66.1 Heparin Dosing Weight: 102.1 kg   Vital Signs: Temp: 98.6 F (37 C) (09/22 0815) Temp Source: Oral (09/22 0815) BP: 133/79 (09/22 0815) Pulse Rate: 78 (09/22 0815)  Labs: Recent Labs    05/19/23 1157 05/19/23 1742 05/20/23 0347 05/21/23 0534 05/21/23 1402 05/22/23 0433  HGB  --   --  13.3 13.5  --   --   HCT  --   --  40.5 40.2  --   --   PLT  --   --  180 222  --   --   LABPROT 15.1  --  14.9 18.5*  --  23.7*  INR 1.2  --  1.2 1.5*  --  2.1*  HEPARINUNFRC 0.45 0.31 0.37  --   --   --   HEPRLOWMOCWT  --   --   --   --  0.91  --   CREATININE  --   --  1.43*  --   --   --     Estimated Creatinine Clearance: 83.3 mL/min (A) (by C-G formula based on SCr of 1.43 mg/dL (H)).   Medical History: Past Medical History:  Diagnosis Date   (HFpEF) heart failure with preserved ejection fraction (HCC)    Arrhythmia    atrial fibrillation   Diabetes mellitus type II, non insulin dependent (HCC)    Diabetes mellitus without complication (HCC)    per pt-pre diabetic-Dr Dario Guardian stated said pt   DVT (deep venous thrombosis) (HCC)    Over 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  Regular the workup has not been forthcoming) previously followed by Dr. Isaiah Serge at [facility]   Hepatic steatosis    Hypertension    Morbid obesity with BMI of 50.0-59.9, adult [facility])    Peripheral vascular disease (HCC)    Presence of IVC filter    Has 2 IVC filters with significant thrombus burden superior to the filter.   Pulmonary embolus (HCC) 12/2016   Initially just treated with  anticoagulation, but in February 2023 in April 2024 treated with EKOS thrombectomy for submassive PE.   Ventricular trigeminy    Weakness of both lower limbs     Medications:  Medications Prior to Admission  Medication Sig Dispense Refill Last Dose   albuterol (VENTOLIN HFA) 108 (90 Base) MCG/ACT inhaler Inhale 2 puffs into the lungs every 6 (six) hours as needed for wheezing or shortness of breath. 8 g 2 prn at unk   magnesium oxide (MAG-OX) 400 (240 Mg) MG tablet Take 400 mg by mouth daily.   Past Month   metoprolol succinate (TOPROL-XL) 25 MG 24 hr tablet Take 1 tablet (25 mg total) by mouth daily.   05/16/2023   sacubitril-valsartan (ENTRESTO) 24-26 MG Take 1 tablet by mouth 2 (two) times daily. 60 tablet 3 05/16/2023   furosemide (LASIX) 40 MG tablet Take 1 tablet (40 mg total) by mouth every other day. 30 tablet 0 05/12/2023   OXYGEN Inhale 1 L into the lungs daily.      tirzepatide [facility]) 5 MG/0.5ML Pen Inject 5  mg into the skin once a week. 6 mL 0 05/09/2023   warfarin (COUMADIN) 5 MG tablet Take 1 tablet (5 mg total) by mouth daily. 90 tablet 1 05/11/2023   Scheduled:   enoxaparin (LOVENOX) injection  150 mg Subcutaneous Q12H   furosemide  40 mg Oral QODAY   metoprolol succinate  25 mg Oral Daily   Warfarin - Pharmacist Dosing Inpatient   Does not apply q1600   Infusions:    PRN: acetaminophen **OR** acetaminophen, albuterol, Heparin (Porcine) in NaCl, iodixanol, lidocaine (PF), ondansetron **OR** ondansetron (ZOFRAN) IV, mouth rinse, senna-docusate Anti-infectives (From admission, onward)    Start     Dose/Rate Route Frequency Ordered Stop   05/17/23 1655  ceFAZolin (ANCEF) IVPB 2g/100 mL premix  Status:  Discontinued        2 g 200 mL/hr over 30 Minutes Intravenous 30 min pre-op 05/17/23 1655 05/17/23 1910       Assessment: 52 y.o. male who presents from wound care clinic with shortness of breath requiring 2 liters on nasal cannula oxygen at all times with oxygen  saturations of 90% while on oxygen.  Upon workup patient was found to have a pulmonary embolism in the right main pulmonary artery which she has had prior.  There is an extension of the clot to the right upper lobe.  There is also interval increase in the peripheral nonocclusive thrombus in the left lower lung pulmonary artery when compared to prior exam.  He is also noted to have a compressed left ventricle compatible with right heart failure/strain.  He also has dilatation of the main pulmonary trunk which is nonspecific but can be seen with pulmonary artery hypertension. Vascular surgery consulted and s/p thrombectomy. Patient is on warfarin 5 mg daily at home but on presentation pt's INR was subtherapeutic. No major DDI. 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 Limited data with DOACs.    8119 0157 HL 0.13  9/18 1004 HL 0.38  9/19 0422 HL 0.21 9/19 1157 HL 0.45   9/21 1400 peak LMWH = 0.91, therapeutic   **transitioned to enoxaparin bridge currently**    Date INR Warfarin Dose  9/18 1.3 7.5 mg   9/19 1.2 7.5 mg   9/20 1.2 12 mg   9/21 1.5 12 mg  9/22 2.1      Goal of Therapy:  INR 2-3 Monitor platelets by anticoagulation protocol: Yes   Plan: Warfarin:  - INR is now therapeutic.  - Will give warfarin 7.5mg  x 1 today(home dose), now that INR is therapeutic. Suspect INR will continue to increase tomorrow but stabilize in the coming days. - Daily INR.  - CBC daily while on lovenox bridge.   Recommend possibly aiming for an INR goal of 2.5 -3.   Enoxaparin: Continue with enoxaparin 150 mg SQ BID   Bettey Costa, PharmD Clinical Pharmacist 05/22/2023 8:48 AM

## 2023-05-23 DIAGNOSIS — I2699 Other pulmonary embolism without acute cor pulmonale: Secondary | ICD-10-CM | POA: Diagnosis not present

## 2023-05-23 LAB — PROTIME-INR
INR: 2.1 — ABNORMAL HIGH (ref 0.8–1.2)
Prothrombin Time: 23.9 seconds — ABNORMAL HIGH (ref 11.4–15.2)

## 2023-05-23 MED ORDER — WARFARIN SODIUM 10 MG PO TABS
10.0000 mg | ORAL_TABLET | Freq: Once | ORAL | Status: AC
  Administered 2023-05-23: 10 mg via ORAL
  Filled 2023-05-23: qty 1

## 2023-05-23 MED ORDER — ENOXAPARIN SODIUM 150 MG/ML IJ SOSY
150.0000 mg | PREFILLED_SYRINGE | Freq: Two times a day (BID) | INTRAMUSCULAR | Status: DC
  Administered 2023-05-23 – 2023-05-24 (×3): 150 mg via SUBCUTANEOUS
  Filled 2023-05-23 (×4): qty 1

## 2023-05-23 NOTE — TOC Progression Note (Signed)
Transition of Care [facility]) - Progression Note    Patient Details  Name: Ryan [name] MRN: [id number] Date of Birth: 1971-03-19  [facility]) CM/SW Contact  Truddie Hidden, RN Phone Number: 05/23/2023, 3:43 PM  Clinical Narrative:     TOC continuing to follow patient's progress throughout discharge planning.        Expected Discharge Plan and Services                                               Social Determinants of Health (SDOH) Interventions SDOH Screenings   Food Insecurity: No Food Insecurity (05/17/2023)  Housing: Low Risk  (05/17/2023)  Transportation Needs: No Transportation Needs (05/17/2023)  Utilities: Not At Risk (05/17/2023)  Depression (PHQ2-9): Low Risk  (05/03/2023)  Financial Resource Strain: Low Risk  (12/30/2022)  Physical Activity: Insufficiently Active (12/30/2022)  Social Connections: Unknown (12/30/2022)  Stress: No Stress Concern Present (12/30/2022)  Tobacco Use: Low Risk  (05/16/2023)    Readmission Risk Interventions     No data to display

## 2023-05-23 NOTE — Consult Note (Addendum)
ANTICOAGULATION CONSULT NOTE   Pharmacy Consult for warfarin + enoxaparin bridge Indication:  history of PE and DVT with a new PE.   Allergies  Allergen Reactions   Metrizamide Other (See Comments)    Kidney failure requiring dialysis Kidney failure requiring dialysis    Clindamycin/Lincomycin Rash   Lincomycin Rash   Sulfa Antibiotics Rash    Patient Measurements: Height: 5\' 7"  (170.2 cm) Weight: (!) 147.4 kg (325 lb) IBW/kg (Calculated) : 66.1  Vital Signs: Temp: 98 F (36.7 C) (09/23 0522) BP: 106/65 (09/23 0522) Pulse Rate: 79 (09/23 0522)  Labs: Recent Labs    05/21/23 0534 05/21/23 1402 05/22/23 0433 05/23/23 0436  HGB 13.5  --   --   --   HCT 40.2  --   --   --   PLT 222  --   --   --   LABPROT 18.5*  --  23.7* 23.9*  INR 1.5*  --  2.1* 2.1*  HEPRLOWMOCWT  --  0.91  --   --    Estimated Creatinine Clearance: 83.3 mL/min (A) (by C-G formula based on SCr of 1.43 mg/dL (H)).  Medical History: Past Medical History:  Diagnosis Date   (HFpEF) heart failure with preserved ejection fraction (HCC)    Arrhythmia    atrial fibrillation   Diabetes mellitus type II, non insulin dependent (HCC)    Diabetes mellitus without complication (HCC)    per pt-pre diabetic-Dr Dario Guardian stated said pt   DVT (deep venous thrombosis) (HCC)    Over 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  Regular the workup has not been forthcoming) previously followed by Dr. Isaiah Serge at [facility]   Hepatic steatosis    Hypertension    Morbid obesity with BMI of 50.0-59.9, adult [facility])    Peripheral vascular disease (HCC)    Presence of IVC filter    Has 2 IVC filters with significant thrombus burden superior to the filter.   Pulmonary embolus (HCC) 12/2016   Initially just treated with anticoagulation, but in February 2023 in April 2024 treated with EKOS thrombectomy for submassive PE.   Ventricular trigeminy    Weakness of both lower limbs    Medication Scheduled:   enoxaparin (LOVENOX) injection  150 mg Subcutaneous  Q12H   furosemide  40 mg Oral QODAY   metoprolol succinate  25 mg Oral Daily   Warfarin - Pharmacist Dosing Inpatient   Does not apply q1600   Assessment: 52 y.o. male who presents from wound care clinic with shortness of breath requiring 2 liters on nasal cannula oxygen at all times with oxygen saturations of 90% while on oxygen.  Upon workup patient was found to have a pulmonary embolism in the right main pulmonary artery which she has had prior.  There is an extension of the clot to the right upper lobe.  There is also interval increase in the peripheral nonocclusive thrombus in the left lower lung pulmonary artery when compared to prior exam.  He is also noted to have a compressed left ventricle compatible with right heart failure/strain.  He also has dilatation of the main pulmonary trunk which is nonspecific but can be seen with pulmonary artery hypertension. Vascular surgery consulted and s/p thrombectomy.   Patient is on warfarin 5 mg daily at home but on presentation pt's INR was subtherapeutic after running out of medication for 2 days. No major DDI. 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 Limited data with DOACs.    *Per Hx patient intolerant  to Xarelto (Gi side-effects) and failed to Elqius - see Hematology note from 03/01/2022*  Heparin infusion transition to enoxaparin on 05/21/2023  Goal of Therapy:  INR 2.5 - 3.5 Monitor platelets by anticoagulation protocol: Yes    Date INR Warfarin Dose  9/18 1.3 7.5 mg   9/19 1.2 7.5 mg   9/20 1.2 12 mg   9/21 1.5 12 mg  9/22 2.1 7.5 mg  9/23 2.1         Plan: INR above 2 x2 with 6 days overlap heparin/enoxaparin and warfarin but new INR goal 2.5 to 3.5 per Dr Georgeann Oppenheim.  Will dc enoxaparin once INR at 2.5 or above Warfarin 10mg  po today x dose Daily INR with AM labs while inpatient CBC at least every 72 hrs  Louna Rothgeb Rodriguez-Guzman PharmD, BCPS 05/23/2023 8:12 AM

## 2023-05-23 NOTE — TOC Transition Note (Signed)
Transition of Care [facility]) - CM/SW Discharge Note   Patient Details  Name: Ryan [name] MRN: [id number] Date of Birth: 1970/11/04  [facility] Care [facility]) CM/SW Contact:  Truddie Hidden, RN Phone Number: 05/23/2023, 3:42 PM   Clinical Narrative:             Patient Goals and CMS Choice      Discharge Placement                         Discharge Plan and Services Additional resources added to the After Visit Summary for                                       Social Determinants of Health (SDOH) Interventions SDOH Screenings   Food Insecurity: No Food Insecurity (05/17/2023)  Housing: Low Risk  (05/17/2023)  Transportation Needs: No Transportation Needs (05/17/2023)  Utilities: Not At Risk (05/17/2023)  Depression (PHQ2-9): Low Risk  (05/03/2023)  Financial Resource Strain: Low Risk  (12/30/2022)  Physical Activity: Insufficiently Active (12/30/2022)  Social Connections: Unknown (12/30/2022)  Stress: No Stress Concern Present (12/30/2022)  Tobacco Use: Low Risk  (05/16/2023)     Readmission Risk Interventions     No data to display

## 2023-05-23 NOTE — Plan of Care (Signed)
Problem: Education: Goal: Knowledge of General Education information will improve Description Including pain rating scale, medication(s)/side effects and non-pharmacologic comfort measures Outcome: Progressing   Problem: Clinical Measurements: Goal: Ability to maintain clinical measurements within normal limits will improve Outcome: Progressing Goal: Will remain free from infection Outcome: Progressing Goal: Diagnostic test results will improve Outcome: Progressing Goal: Respiratory complications will improve Outcome: Progressing Goal: Cardiovascular complication will be avoided Outcome: Progressing   Problem: Activity: Goal: Risk for activity intolerance will decrease Outcome: Progressing   Problem: Elimination: Goal: Will not experience complications related to bowel motility Outcome: Progressing Goal: Will not experience complications related to urinary retention Outcome: Progressing   Problem: Pain Managment: Goal: General experience of comfort will improve Outcome: Progressing

## 2023-05-23 NOTE — Progress Notes (Signed)
PROGRESS NOTE    Ryan [name]  ONG:[id number] DOB: Sep 22, 1970 DOA: 05/16/2023 PCP: [name], NP    Brief Narrative:   Ryan [name] is a 52 y.o. male with history of morbid obesity, history of PE and DVT on warfarin, patient is status post 2 IVC filter placement, CKD stage IIIa, history of hemodialysis secondary to contrast nephropathy 8 years ago, history of pulmonary embolism thrombectomy in April 2024, chronic lymphedema, chronic hypoxemic respiratory failure on nocturnal oxygen at home, no diagnosis of sleep apnea and does not use CPAP at home per patient report.  He presented to the hospital because of elevated heart rate, shortness of breath and decreased oxygen saturation.  He said his oxygen saturation was 86% on room air and his heart rate was in the 120s.  These findings were similar to what he experienced when he had pulmonary embolism in the past.  He takes warfarin but he said he had not taken his warfarin since Wednesday, 05/11/2023, because he ran out off his warfarin and he could not get it refilled in time.   He was found to have acute pulmonary embolism, acute hypoxemic respiratory failure and acute kidney injury.   Assessment & Plan:   Principal Problem:   Pulmonary emboli (HCC) Active Problems:   Acute respiratory failure with hypoxia (HCC)   AKI (acute kidney injury) (HCC)   History of pulmonary embolism and DVT   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)   Essential hypertension   CKD (chronic kidney disease), stage IIIa   Morbid obesity with BMI of 50.0-59.9, adult (HCC)   Hepatic steatosis   Hyperlipidemia associated with type 2 diabetes mellitus (HCC)   Physical deconditioning   At risk for sleep apnea   Lymphadenopathy   Anticoagulation goal of INR 2 to 3   Bilateral pulmonary embolism (HCC)   Cor pulmonale (chronic) (HCC)  Acute pulmonary embolism in a patient with history of DVT and pulmonary embolism:  Status post mechanical  thrombectomy with vascular surgery on 9/17.  Tolerated procedure well.  On room air.  Discussed anticoagulation strategy with vascular surgery and hematology.  Patient has previously failed DOAC's so warfarin is recommended at this time. Plan: Continue warfarin.  Continue Lovenox bridge.  Appreciate pharmacy dosing assistance.  Current INR 2.1.  Discharge once INR greater than 2.5  Acute on chronic hypoxemic respiratory failure: He only uses 2 L/min oxygen at night but does not use any oxygen during the day.  He is currently on 2 L/min oxygen via [place].  Continue as needed     AKI: Renal function has approached baseline.  Monitor renal function  Hyponatremia: Mild and asymptomatic   Morbid obesity: This complicates overall care and prognosis.  He is at high risk for OSA and has been advised to follow-up with the pulmonologist for formal sleep testing.     Other comorbidities include cor pulmonale, morbid obesity, chronic left leg wound.   DVT prophylaxis: SQ Lovenox, coumadin Code Status: Full Family Communication: None Disposition Plan: Status is: Inpatient Remains inpatient appropriate because: Awaiting therapeutic Coumadin   Level of care: Progressive  Consultants:  Vascular surgery  Procedures:  Pulmonary thrombectomy  Antimicrobials: None   Subjective: Seen and examined.  Sitting up on edge of bed.  Comfortable.  No distress.  Objective: Vitals:   05/22/23 2333 05/23/23 0522 05/23/23 0809 05/23/23 1200  BP: (!) 125/95 106/65 118/89 (!) 130/92  Pulse: 81 79 75 74  Resp: 18 20 (!)  22 (!) 23  Temp: 98.3 F (36.8 C) 98 F (36.7 C) (!) 97.5 F (36.4 C) 97.7 F (36.5 C)  TempSrc:   Oral Oral  SpO2: 94% 96% 95% 96%  Weight:      Height:        Intake/Output Summary (Last 24 hours) at 05/23/2023 1214 Last data filed at 05/23/2023 0524 Gross per 24 hour  Intake 600 ml  Output 1325 ml  Net -725 ml   Filed Weights   05/16/23 0906  Weight: (!) 147.4 kg     Examination:  General exam: No acute distress Respiratory system: Decreased bases.  Normal work of breathing.  2 L Cardiovascular system: S1-S2 RRR, no murmurs, no pedal edema Gastrointestinal system: Obese, soft, NT/ND, normal bowel sounds Central nervous system: Alert and oriented. No focal neurological deficits. Extremities: Symmetric 5 x 5 power. Skin: No rashes, lesions or ulcers Psychiatry: Judgement and insight appear normal. Mood & affect appropriate.     Data Reviewed: I have personally reviewed following labs and imaging studies  CBC: Recent Labs  Lab 05/17/23 0419 05/18/23 0157 05/19/23 0422 05/20/23 0347 05/21/23 0534  WBC 5.2 5.0 4.1 4.1 3.9*  HGB 15.8 14.3 13.9 13.3 13.5  HCT 47.6 43.4 42.8 40.5 40.2  MCV 90.2 91.6 91.1 91.4 88.9  PLT 164 145* 162 180 222   Basic Metabolic Panel: Recent Labs  Lab 05/17/23 0419 05/18/23 0157 05/20/23 0347  NA 132* 134* 136  K 3.9 3.8 3.6  CL 101 103 103  CO2 21* 21* 24  GLUCOSE 114* 118* 106*  BUN 22* 24* 18  CREATININE 1.84* 1.43* 1.43*  CALCIUM 8.1* 8.0* 8.1*   GFR: Estimated Creatinine Clearance: 83.3 mL/min (A) (by C-G formula based on SCr of 1.43 mg/dL (H)). Liver Function Tests: No results for input(s): "AST", "ALT", "ALKPHOS", "BILITOT", "PROT", "ALBUMIN" in the last 168 hours. No results for input(s): "LIPASE", "AMYLASE" in the last 168 hours. No results for input(s): "AMMONIA" in the last 168 hours. Coagulation Profile: Recent Labs  Lab 05/19/23 1157 05/20/23 0347 05/21/23 0534 05/22/23 0433 05/23/23 0436  INR 1.2 1.2 1.5* 2.1* 2.1*   Cardiac Enzymes: No results for input(s): "CKTOTAL", "CKMB", "CKMBINDEX", "TROPONINI" in the last 168 hours. BNP (last 3 results) No results for input(s): "PROBNP" in the last 8760 hours. HbA1C: No results for input(s): "HGBA1C" in the last 72 hours. CBG: No results for input(s): "GLUCAP" in the last 168 hours. Lipid Profile: No results for input(s):  "CHOL", "HDL", "LDLCALC", "TRIG", "CHOLHDL", "LDLDIRECT" in the last 72 hours. Thyroid Function Tests: No results for input(s): "TSH", "T4TOTAL", "FREET4", "T3FREE", "THYROIDAB" in the last 72 hours. Anemia Panel: No results for input(s): "VITAMINB12", "FOLATE", "FERRITIN", "TIBC", "IRON", "RETICCTPCT" in the last 72 hours. Sepsis Labs: No results for input(s): "PROCALCITON", "LATICACIDVEN" in the last 168 hours.  No results found for this or any previous visit (from the past 240 hour(s)).       Radiology Studies: No results found.      Scheduled Meds:  furosemide  40 mg Oral QODAY   metoprolol succinate  25 mg Oral Daily   warfarin  10 mg Oral ONCE-1600   Warfarin - Pharmacist Dosing Inpatient   Does not apply q1600   Continuous Infusions:     LOS: 7 days       Tresa Moore, MD [facility]   If 7PM-7AM, please contact night-coverage  05/23/2023, 12:14 PM

## 2023-05-24 DIAGNOSIS — I2699 Other pulmonary embolism without acute cor pulmonale: Secondary | ICD-10-CM | POA: Diagnosis not present

## 2023-05-24 LAB — PROTIME-INR
INR: 2.2 — ABNORMAL HIGH (ref 0.8–1.2)
Prothrombin Time: 25 seconds — ABNORMAL HIGH (ref 11.4–15.2)

## 2023-05-24 LAB — CBC
HCT: 43.4 % (ref 39.0–52.0)
Hemoglobin: 14.4 g/dL (ref 13.0–17.0)
MCH: 30.3 pg (ref 26.0–34.0)
MCHC: 33.2 g/dL (ref 30.0–36.0)
MCV: 91.4 fL (ref 80.0–100.0)
Platelets: 340 10*3/uL (ref 150–400)
RBC: 4.75 MIL/uL (ref 4.22–5.81)
RDW: 16.2 % — ABNORMAL HIGH (ref 11.5–15.5)
WBC: 3.9 10*3/uL — ABNORMAL LOW (ref 4.0–10.5)
nRBC: 0 % (ref 0.0–0.2)

## 2023-05-24 MED ORDER — WARFARIN SODIUM 10 MG PO TABS
10.0000 mg | ORAL_TABLET | Freq: Once | ORAL | Status: AC
  Administered 2023-05-24: 10 mg via ORAL
  Filled 2023-05-24: qty 1

## 2023-05-24 NOTE — Consult Note (Signed)
ANTICOAGULATION CONSULT NOTE   Pharmacy Consult for warfarin + enoxaparin bridge Indication:  history of PE and DVT with a new PE.   Allergies  Allergen Reactions   Metrizamide Other (See Comments)    Kidney failure requiring dialysis Kidney failure requiring dialysis    Clindamycin/Lincomycin Rash   Lincomycin Rash   Sulfa Antibiotics Rash    Patient Measurements: Height: 5\' 7"  (170.2 cm) Weight: (!) 147.4 kg (325 lb) IBW/kg (Calculated) : 66.1  Vital Signs: Temp: 97.6 F (36.4 C) (09/24 0805) BP: 119/83 (09/24 0805) Pulse Rate: 69 (09/24 0805)  Labs: Recent Labs    05/21/23 1402 05/22/23 0433 05/23/23 0436 05/24/23 0704  LABPROT  --  23.7* 23.9* 25.0*  INR  --  2.1* 2.1* 2.2*  HEPRLOWMOCWT 0.91  --   --   --    Estimated Creatinine Clearance: 83.3 mL/min (A) (by C-G formula based on SCr of 1.43 mg/dL (H)).  Medical History: Past Medical History:  Diagnosis Date   (HFpEF) heart failure with preserved ejection fraction (HCC)    Arrhythmia    atrial fibrillation   Diabetes mellitus type II, non insulin dependent (HCC)    Diabetes mellitus without complication (HCC)    per pt-pre diabetic-Dr Dario Guardian stated said pt   DVT (deep venous thrombosis) (HCC)    Over 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  Regular the workup has not been forthcoming) previously followed by [facility] at [facility]   Hepatic steatosis    Hypertension    Morbid obesity with BMI of 50.0-59.9, adult [facility])    Peripheral vascular disease (HCC)    Presence of IVC filter    Has 2 IVC filters with significant thrombus burden superior to the filter.   Pulmonary embolus (HCC) 12/2016   Initially just treated with anticoagulation, but in February 2023 in April 2024 treated with EKOS thrombectomy for submassive PE.   Ventricular trigeminy    Weakness of both lower limbs    Medication Scheduled:   enoxaparin (LOVENOX) injection  150 mg Subcutaneous Q12H   furosemide  40 mg Oral QODAY   metoprolol succinate  25 mg Oral Daily    Warfarin - Pharmacist Dosing Inpatient   Does not apply q1600   Assessment: 52 y.o. male who presents from wound care clinic with shortness of breath requiring 2 liters on nasal cannula oxygen at all times with oxygen saturations of 90% while on oxygen.  Upon workup patient was found to have a pulmonary embolism in the right main pulmonary artery which she has had prior.  There is an extension of the clot to the right upper lobe.  There is also interval increase in the peripheral nonocclusive thrombus in the left lower lung pulmonary artery when compared to prior exam.  He is also noted to have a compressed left ventricle compatible with right heart failure/strain.  He also has dilatation of the main pulmonary trunk which is nonspecific but can be seen with pulmonary artery hypertension. Vascular surgery consulted and s/p thrombectomy.   Patient is on warfarin 5 mg daily at home but on presentation pt's INR was subtherapeutic after running out of medication for 2 days. No major DDI. 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 Limited data with DOACs.    *Per Hx patient intolerant to Xarelto (Gi side-effects) and failed to Elqius - see Hematology note from 03/01/2022*  Heparin infusion transition to enoxaparin on 05/21/2023  Goal of Therapy:  INR 2.5 - 3.5 Monitor platelets by anticoagulation protocol:  Yes    Date INR Warfarin Dose  9/18 1.3 7.5 mg   9/19 1.2 7.5 mg   9/20 1.2 12 mg   9/21 1.5 12 mg  9/22 2.1 7.5 mg  9/23 2.1 10 mg  9/24 2.2     Plan: INR above 2 but new INR goal 2.5 to 3.5 per Dr Georgeann Oppenheim.  Will dc enoxaparin once INR at 2.5 or above Warfarin 10mg  po today x 1 dose Daily INR with AM labs while inpatient CBC at least every 72 hrs  Ryan [name] PharmD, BCPS 05/24/2023 8:12 AM

## 2023-05-24 NOTE — Plan of Care (Signed)

## 2023-05-24 NOTE — Progress Notes (Signed)
PROGRESS NOTE    Ryan [name]  ZOX:[id number] DOB: 09/24/1970 DOA: 05/16/2023 PCP: [name], NP    Brief Narrative:   Ryan [name] is a 52 y.o. male with history of morbid obesity, history of PE and DVT on warfarin, patient is status post 2 IVC filter placement, CKD stage IIIa, history of hemodialysis secondary to contrast nephropathy 8 years ago, history of pulmonary embolism thrombectomy in April 2024, chronic lymphedema, chronic hypoxemic respiratory failure on nocturnal oxygen at home, no diagnosis of sleep apnea and does not use CPAP at home per patient report.  He presented to the hospital because of elevated heart rate, shortness of breath and decreased oxygen saturation.  He said his oxygen saturation was 86% on room air and his heart rate was in the 120s.  These findings were similar to what he experienced when he had pulmonary embolism in the past.  He takes warfarin but he said he had not taken his warfarin since Wednesday, 05/11/2023, because he ran out off his warfarin and he could not get it refilled in time.   He was found to have acute pulmonary embolism, acute hypoxemic respiratory failure and acute kidney injury.  The patient is status post mechanical thrombectomy.  Doing well overall.  Discharge pending therapeutic INR goal 2.5-3.   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  Tolerated procedure well.  On room air.  Discussed anticoagulation strategy with vascular surgery and hematology.  Patient has previously failed DOAC's so warfarin is recommended at this time.  I also offered to discharge him with prescription for Lovenox bridge until his INR is therapeutic.  He is reluctant stating that he is unable to self administer SQ Lovenox injections. Plan: Continue warfarin.  Continue Lovenox bridge.  Appreciate pharmacy dosing assistance.  Current INR 2.2.  Discharge once INR greater than 2.5  Acute on chronic hypoxemic respiratory failure: He only uses 2 L/min oxygen at night but does not use any oxygen during the day.  He is currently on 2 L/min oxygen via [place].  Continue as needed     AKI: Renal function has approached baseline.  Monitor renal function  Hyponatremia: Mild and asymptomatic   Morbid obesity: This complicates overall care and prognosis.  He is at high risk for OSA and has been advised to follow-up with the pulmonologist for formal sleep testing.     Other comorbidities include cor pulmonale, morbid obesity, chronic left leg wound.   DVT prophylaxis: SQ Lovenox, coumadin Code Status: Full Family Communication: None Disposition Plan: Status is: Inpatient Remains inpatient appropriate because: Awaiting therapeutic Coumadin.  Anticipate discharge 9/25.   Level of care: Progressive  Consultants:  Vascular surgery  Procedures:  Pulmonary thrombectomy  Antimicrobials: None   Subjective: Seen and examined.  Lying in bed.  No visible distress.  No complaints of pain  Objective: Vitals:   05/23/23 2333 05/24/23 0549 05/24/23 0805 05/24/23 1150  BP: (!) 153/94 116/79 119/83 139/77  Pulse: 83 78 69 93  Resp: 19 19    Temp: 98.2 F (36.8 C) 98.2 F (36.8 C) 97.6 F (36.4 C)  97.8 F (36.6 C)  TempSrc:      SpO2: 97% 92% 93% 94%  Weight:      Height:        Intake/Output Summary (Last 24 hours) at 05/24/2023 1330 Last data filed at 05/23/2023 1900 Gross per 24 hour  Intake --  Output 175 ml  Net -175 ml   Filed Weights   05/16/23 0906  Weight: (!) 147.4 kg    Examination:  General exam: NAD Respiratory system: Lung sounds decreased at bases.  Normal work of breathing.  2 L Cardiovascular system: S1-S2 RRR, no murmurs, no pedal edema Gastrointestinal system: Obese, soft, NT/ND, normal bowel sounds Central nervous system: Alert and oriented. No focal neurological deficits. Extremities: Symmetric 5 x 5 power. Skin: No rashes, lesions or ulcers Psychiatry: Judgement and insight appear normal. Mood & affect appropriate.     Data Reviewed: I have personally reviewed following labs and imaging studies  CBC: Recent Labs  Lab 05/18/23 0157 05/19/23 0422 05/20/23 0347 05/21/23 0534 05/24/23 0704  WBC 5.0 4.1 4.1 3.9* 3.9*  HGB 14.3 13.9 13.3 13.5 14.4  HCT 43.4 42.8 40.5 40.2 43.4  MCV 91.6 91.1 91.4 88.9 91.4  PLT 145* 162 180 222 340   Basic Metabolic Panel: Recent Labs  Lab 05/18/23 0157 05/20/23 0347  NA 134* 136  K 3.8 3.6  CL 103 103  CO2 21* 24  GLUCOSE 118* 106*  BUN 24* 18  CREATININE 1.43* 1.43*  CALCIUM 8.0* 8.1*   GFR: Estimated Creatinine Clearance: 83.3 mL/min (A) (by C-G formula based on SCr of 1.43 mg/dL (H)). Liver Function Tests: No results for input(s): "AST", "ALT", "ALKPHOS", "BILITOT", "PROT", "ALBUMIN" in the last 168 hours. No results for input(s): "LIPASE", "AMYLASE" in the last 168 hours. No results for input(s): "AMMONIA" in the last 168 hours. Coagulation Profile: Recent Labs  Lab 05/20/23 0347 05/21/23 0534 05/22/23 0433 05/23/23 0436 05/24/23 0704  INR 1.2 1.5* 2.1* 2.1* 2.2*   Cardiac Enzymes: No results for input(s): "CKTOTAL", "CKMB", "CKMBINDEX", "TROPONINI" in the last 168 hours. BNP  (last 3 results) No results for input(s): "PROBNP" in the last 8760 hours. HbA1C: No results for input(s): "HGBA1C" in the last 72 hours. CBG: No results for input(s): "GLUCAP" in the last 168 hours. Lipid Profile: No results for input(s): "CHOL", "HDL", "LDLCALC", "TRIG", "CHOLHDL", "LDLDIRECT" in the last 72 hours. Thyroid Function Tests: No results for input(s): "TSH", "T4TOTAL", "FREET4", "T3FREE", "THYROIDAB" in the last 72 hours. Anemia Panel: No results for input(s): "VITAMINB12", "FOLATE", "FERRITIN", "TIBC", "IRON", "RETICCTPCT" in the last 72 hours. Sepsis Labs: No results for input(s): "PROCALCITON", "LATICACIDVEN" in the last 168 hours.  No results found for this or any previous visit (from the past 240 hour(s)).       Radiology Studies: No results found.      Scheduled Meds:  enoxaparin (LOVENOX) injection  150 mg Subcutaneous Q12H   furosemide  40 mg Oral QODAY   metoprolol succinate  25 mg Oral Daily   Warfarin - Pharmacist Dosing Inpatient   Does not apply q1600  Continuous Infusions:     LOS: 8 days       Tresa Moore, MD [facility]   If 7PM-7AM, please contact night-coverage  05/24/2023, 1:30 PM

## 2023-05-25 DIAGNOSIS — I2699 Other pulmonary embolism without acute cor pulmonale: Secondary | ICD-10-CM | POA: Diagnosis not present

## 2023-05-25 LAB — PROTIME-INR
INR: 2.7 — ABNORMAL HIGH (ref 0.8–1.2)
Prothrombin Time: 28.9 seconds — ABNORMAL HIGH (ref 11.4–15.2)

## 2023-05-25 MED ORDER — WARFARIN SODIUM 5 MG PO TABS
5.0000 mg | ORAL_TABLET | Freq: Every day | ORAL | Status: DC
  Filled 2023-05-25: qty 1

## 2023-05-25 NOTE — Consult Note (Signed)
ANTICOAGULATION CONSULT NOTE   Pharmacy Consult for warfarin + enoxaparin bridge Indication:  history of PE and DVT with a new PE.   Allergies  Allergen Reactions   Metrizamide Other (See Comments)    Kidney failure requiring dialysis Kidney failure requiring dialysis    Clindamycin/Lincomycin Rash   Lincomycin Rash   Sulfa Antibiotics Rash    Patient Measurements: Height: 5\' 7"  (170.2 cm) Weight: (!) 147.4 kg (325 lb) IBW/kg (Calculated) : 66.1  Vital Signs: Temp: 98.4 F (36.9 C) (09/25 0449) BP: 117/87 (09/25 0449) Pulse Rate: 78 (09/25 0449)  Labs: Recent Labs    05/23/23 0436 05/24/23 0704 05/25/23 0329  HGB  --  14.4  --   HCT  --  43.4  --   PLT  --  340  --   LABPROT 23.9* 25.0* 28.9*  INR 2.1* 2.2* 2.7*   Estimated Creatinine Clearance: 83.3 mL/min (A) (by C-G formula based on SCr of 1.43 mg/dL (H)).  Medical History: Past Medical History:  Diagnosis Date   (HFpEF) heart failure with preserved ejection fraction (HCC)    Arrhythmia    atrial fibrillation   Diabetes mellitus type II, non insulin dependent (HCC)    Diabetes mellitus without complication (HCC)    per pt-pre diabetic-Dr Dario Guardian stated said pt   DVT (deep venous thrombosis) (HCC)    Over 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  Regular the workup has not been forthcoming) previously [facility]   Hepatic steatosis    Hypertension    Morbid obesity with BMI of 50.0-59.9, adult [facility])    Peripheral vascular disease (HCC)    Presence of IVC filter    Has 2 IVC filters with significant thrombus burden superior to the filter.   Pulmonary embolus (HCC) 12/2016   Initially just treated with anticoagulation, but in February 2023 in April 2024 treated with EKOS thrombectomy for submassive PE.   Ventricular trigeminy    Weakness of both lower limbs    Medication Scheduled:   furosemide  40 mg Oral QODAY   metoprolol succinate  25 mg Oral Daily   warfarin  5 mg Oral q1600   Warfarin - Pharmacist  Dosing Inpatient   Does not apply q1600   Assessment: 52 y.o. male who presents from wound care clinic with shortness of breath requiring 2 liters on nasal cannula oxygen at all times with oxygen saturations of 90% while on oxygen.  Upon workup patient was found to have a pulmonary embolism in the right main pulmonary artery which she has had prior.  There is an extension of the clot to the right upper lobe.  There is also interval increase in the peripheral nonocclusive thrombus in the left lower lung pulmonary artery when compared to prior exam.  He is also noted to have a compressed left ventricle compatible with right heart failure/strain.  He also has dilatation of the main pulmonary trunk which is nonspecific but can be seen with pulmonary artery hypertension. Vascular surgery consulted and s/p thrombectomy.   Patient is on warfarin 5 mg daily at home but on presentation pt's INR was subtherapeutic after running out of medication for 2 days. No major DDI. 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 Limited data with DOACs.    *Per Hx patient intolerant to Xarelto (Gi side-effects) and failed to Elqius - see Hematology note from 03/01/2022*  Heparin infusion transition to enoxaparin on 05/21/2023  Goal of Therapy:  INR 2.5 - 3.5  Monitor platelets by anticoagulation protocol: Yes    Date INR Warfarin Dose  9/18 1.3 7.5 mg   9/19 1.2 7.5 mg   9/20 1.2 12 mg   9/21 1.5 12 mg  9/22 2.1 7.5 mg  9/23 2.1 10 mg  9/24 2.2 10 mg  9/25 2.7         Plan:INR within new goal range of  2.5 to 3.5 Dc enoxaparin Resume warfarin 5mg  daily (PTA dose) Daily INR with AM labs while inpatient CBC at least every 72 hrs  Jenipher Havel Rodriguez-Guzman PharmD, BCPS 05/25/2023 8:01 AM

## 2023-05-25 NOTE — Discharge Summary (Signed)
Physician Discharge Summary   Patient: Ryan [name] MRN: [id number] DOB: Jul 05, 1971  Admit date:     05/16/2023  Discharge date: 05/25/23  Discharge Physician: Lurene Shadow   PCP: [name], NP   Recommendations at discharge:   Check INR on Friday, 05/27/2023. Follow-up with PCP within 1 week of discharge. Follow-up with the wound care clinic as scheduled on 05/26/2023.  Discharge Diagnoses: Principal Problem:   Pulmonary emboli (HCC) Active Problems:   Acute respiratory failure with hypoxia (HCC)   AKI (acute kidney injury) (HCC)   History of pulmonary embolism and DVT   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)   Essential hypertension   CKD (chronic kidney disease), stage IIIa   Morbid obesity with BMI of 50.0-59.9, adult (HCC)   Hepatic steatosis   Hyperlipidemia associated with type 2 diabetes mellitus (HCC)   Physical deconditioning   At risk for sleep apnea   Lymphadenopathy   Anticoagulation goal of INR 2 to 3   Bilateral pulmonary embolism (HCC)   Cor pulmonale (chronic) (HCC)  Resolved Problems:   * No resolved hospital problems. *  Hospital Course:  Ryan [name] is a 52 y.o. male with history of morbid obesity, history of PE and DVT on warfarin, patient is status post 2 IVC filter placement, CKD stage IIIa, history of hemodialysis secondary to contrast nephropathy 8 years ago, history of pulmonary embolism thrombectomy in April 2024, chronic lymphedema, chronic hypoxemic respiratory failure on nocturnal oxygen at home, no diagnosis of sleep apnea and does not use CPAP at home per patient report.  He presented to the hospital because of elevated heart rate, shortness of breath and decreased oxygen saturation.  He said his oxygen saturation was 86% on room air and his heart rate was in the 120s.  These findings were similar to what he experienced when he had pulmonary embolism in the past.  He takes warfarin but he said he had not taken his  warfarin since Wednesday, 05/11/2023, because he ran out off his warfarin and he could not get it refilled in time.   He was found to have acute pulmonary embolism, acute hypoxemic respiratory failure and acute kidney injury.       Assessment and Plan:   Acute pulmonary embolism in a patient with history of DVT and pulmonary embolism:  Status post mechanical thrombectomy with vascular surgery on 05/17/2023.  INR is 2.7.  He will continue with warfarin 5 mg daily at discharge.    Acute on chronic hypoxemic respiratory failure: He only uses 2 L/min oxygen at night but does not use any oxygen during the day.  He is currently on 2 L/min oxygen via .  Continue as needed     AKI on CKD stage 3a: Improved.     Hyponatremia: Mild and asymptomatic    Morbid obesity: This complicates overall care and prognosis.  He is at high risk for OSA and has been advised to follow-up with the pulmonologist for formal sleep testing.     Other comorbidities include cor pulmonale, morbid obesity, chronic left leg wound.    His condition has improved and he is deemed stable for discharge to home today.       Consultants: Vascular surgeon Procedures performed: Mechanical pulmonary thrombectomy Disposition: Home Diet recommendation:  Discharge Diet Orders (From admission, onward)     Start     Ordered   05/25/23 0000  Diet - low sodium heart healthy  05/25/23 0936           Cardiac diet DISCHARGE MEDICATION: Allergies as of 05/25/2023       Reactions   Metrizamide Other (See Comments)   Kidney failure requiring dialysis Kidney failure requiring dialysis   Clindamycin/lincomycin Rash   Lincomycin Rash   Sulfa Antibiotics Rash        Medication List     TAKE these medications    albuterol 108 (90 Base) MCG/ACT inhaler Commonly known as: VENTOLIN HFA Inhale 2 puffs into the lungs every 6 (six) hours as needed for wheezing or shortness of breath.   furosemide 40 MG  tablet Commonly known as: LASIX Take 1 tablet (40 mg total) by mouth every other day.   magnesium oxide 400 (240 Mg) MG tablet Commonly known as: MAG-OX Take 400 mg by mouth daily.   metoprolol succinate 25 MG 24 hr tablet Commonly known as: TOPROL-XL Take 1 tablet (25 mg total) by mouth daily.   Mounjaro 5 MG/0.5ML Pen Generic drug: tirzepatide Inject 5 mg into the skin once a week.   OXYGEN Inhale 1 L into the lungs daily.   sacubitril-valsartan 24-26 MG Commonly known as: ENTRESTO Take 1 tablet by mouth 2 (two) times daily.   warfarin 5 MG tablet Commonly known as: COUMADIN Take 1 tablet (5 mg total) by mouth daily.               Discharge Care Instructions  (From admission, onward)           Start     Ordered   05/25/23 0000  Discharge wound care:       Comments: Go to wound care clinic as scheduled on 05/26/2023   05/25/23 4401            Follow-up Information     [name], NP. Schedule an appointment as soon as possible for a visit in 1 week(s).   Specialty: Nurse Practitioner Contact information: [address] [place] [address] [phone]         Mertie Moores, MD. Schedule an appointment as soon as possible for a visit in 1 week(s).   Specialty: Specialist Contact information: [address] [place] [address] [phone]                Discharge Exam: Ceasar Mons Weights   05/16/23 0906  Weight: (!) 147.4 kg   GEN: NAD SKIN: Warm and dry EYES: No pallor or icterus ENT: MMM CV: RRR PULM: CTA B ABD: soft, obese, NT, +BS CNS: AAO x 3, non focal EXT: Dressing on left leg is clean, dry and intact    Condition at discharge: good  The results of significant diagnostics from this hospitalization (including imaging, microbiology, ancillary and laboratory) are listed below for reference.   Imaging Studies: PERIPHERAL VASCULAR CATHETERIZATION  Result Date: 05/17/2023 See surgical note for  result.  ECHOCARDIOGRAM COMPLETE  Result Date: 05/17/2023    ECHOCARDIOGRAM REPORT   Patient Name:   Ryan [name] Date of Exam: 05/17/2023 Medical Rec #:  [id number]     Height:       67.0 in Accession #:    [id number]    Weight:       325.0 lb Date of Birth:  08/13/1971     BSA:          2.486 m Patient Age:    52 years      BP:  107/73 mmHg Patient Gender: M             HR:           73 bpm. Exam Location:  [facility] Procedure: 2D Echo, Cardiac Doppler and Color Doppler Indications:     Pulmonary embolus  History:         Patient has prior history of Echocardiogram examinations, most                  recent 04/08/2022. CHF, Angina, Arrythmias:Atrial Fibrillation,                  Atrial Flutter and PVC, Signs/Symptoms:Fatigue; Risk                  Factors:Hypertension, Diabetes and Dyslipidemia. Pulmonary                  embolus, CKD.  Sonographer:     Mikki Harbor Referring Phys:  [id number] AMY N COX Diagnosing Phys: Lorine Bears MD  Sonographer Comments: Technically difficult study due to poor echo windows and patient is obese. IMPRESSIONS  1. Left ventricular ejection fraction, by estimation, is 60 to 65%. The left ventricle has normal function. The left ventricle has no regional wall motion abnormalities. There is mild asymmetric left ventricular hypertrophy of the basal-septal segment. Left ventricular diastolic parameters were normal. There is the interventricular septum is flattened in systole, consistent with right ventricular pressure overload.  2. Right ventricular systolic function is moderately reduced. The right ventricular size is moderately enlarged. Tricuspid regurgitation signal is inadequate for assessing PA pressure.  3. Right atrial size was moderately dilated.  4. The mitral valve is normal in structure. No evidence of mitral valve regurgitation. No evidence of mitral stenosis.  5. The aortic valve is calcified. Aortic valve regurgitation is trivial. Aortic valve  sclerosis/calcification is present, without any evidence of aortic stenosis. FINDINGS  Left Ventricle: Left ventricular ejection fraction, by estimation, is 60 to 65%. The left ventricle has normal function. The left ventricle has no regional wall motion abnormalities. The left ventricular internal cavity size was normal in size. There is  mild asymmetric left ventricular hypertrophy of the basal-septal segment. The interventricular septum is flattened in systole, consistent with right ventricular pressure overload. Left ventricular diastolic parameters were normal. Right Ventricle: The right ventricular size is moderately enlarged. No increase in right ventricular wall thickness. Right ventricular systolic function is moderately reduced. Tricuspid regurgitation signal is inadequate for assessing PA pressure. Left Atrium: Left atrial size was normal in size. Right Atrium: Right atrial size was moderately dilated. Pericardium: There is no evidence of pericardial effusion. Mitral Valve: The mitral valve is normal in structure. No evidence of mitral valve regurgitation. No evidence of mitral valve stenosis. MV peak gradient, 3.1 mmHg. The mean mitral valve gradient is 1.0 mmHg. Tricuspid Valve: The tricuspid valve is normal in structure. Tricuspid valve regurgitation is trivial. No evidence of tricuspid stenosis. Aortic Valve: The aortic valve is calcified. Aortic valve regurgitation is trivial. Aortic valve sclerosis/calcification is present, without any evidence of aortic stenosis. Aortic valve mean gradient measures 4.0 mmHg. Aortic valve peak gradient measures 7.8 mmHg. Aortic valve area, by VTI measures 2.31 cm. Pulmonic Valve: The pulmonic valve was normal in structure. Pulmonic valve regurgitation is mild. No evidence of pulmonic stenosis. Aorta: The aortic root is normal in size and structure. Venous: The inferior vena cava was not well visualized. IAS/Shunts: No atrial level shunt detected by color flow  Doppler.  LEFT VENTRICLE PLAX 2D LVIDd:         5.00 cm   Diastology LVIDs:         3.20 cm   LV e' medial:    8.49 cm/s LV PW:         0.80 cm   LV E/e' medial:  7.8 LV IVS:        0.85 cm   LV e' lateral:   15.40 cm/s LVOT diam:     2.00 cm   LV E/e' lateral: 4.3 LV SV:         59 LV SV Index:   24 LVOT Area:     3.14 cm  RIGHT VENTRICLE RV Basal diam:  5.30 cm RV Mid diam:    4.20 cm RV S prime:     13.20 cm/s TAPSE (M-[name]): 1.5 cm LEFT ATRIUM             Index        RIGHT ATRIUM           Index LA diam:        3.60 cm 1.45 cm/m   RA Area:     27.00 cm LA Vol (A2C):   34.4 ml 13.84 ml/m  RA Volume:   100.00 ml 40.23 ml/m LA Vol (A4C):   15.4 ml 6.20 ml/m LA Biplane Vol: 23.4 ml 9.41 ml/m  AORTIC VALVE                    PULMONIC VALVE AV Area (Vmax):    2.42 cm     PV Vmax:       0.66 m/s AV Area (Vmean):   2.12 cm     PV Peak grad:  1.8 mmHg AV Area (VTI):     2.31 cm AV Vmax:           140.00 cm/s AV Vmean:          88.300 cm/s AV VTI:            0.254 m AV Peak Grad:      7.8 mmHg AV Mean Grad:      4.0 mmHg LVOT Vmax:         108.00 cm/s LVOT Vmean:        59.600 cm/s LVOT VTI:          0.187 m LVOT/AV VTI ratio: 0.74  AORTA Ao Root diam: 3.20 cm MITRAL VALVE               TRICUSPID VALVE MV Area (PHT): 2.55 cm    TR Peak grad:   22.1 mmHg MV Area VTI:   2.08 cm    TR Vmax:        235.00 cm/s MV Peak grad:  3.1 mmHg MV Mean grad:  1.0 mmHg    SHUNTS MV Vmax:       0.87 m/s    Systemic VTI:  0.19 m MV Vmean:      53.1 cm/s   Systemic Diam: 2.00 cm MV Decel Time: 297 msec MV E velocity: 66.20 cm/s MV A velocity: 83.40 cm/s MV E/A ratio:  0.79 Lorine Bears MD Electronically signed by Lorine Bears MD Signature Date/Time: 05/17/2023/1:01:14 PM    Final    CT Angio Chest PE W and/or Wo Contrast  Result Date: 05/16/2023 CLINICAL DATA:  Pulmonary embolism (PE) suspected, high prob. Shortness of breath. Fatigue. Chills. EXAM: CT ANGIOGRAPHY CHEST WITH CONTRAST TECHNIQUE: Multidetector CT imaging  of the chest was performed using the standard protocol during bolus administration of intravenous contrast. Multiplanar CT image reconstructions and MIPs were obtained to evaluate the vascular anatomy. RADIATION DOSE REDUCTION: This exam was performed according to the departmental dose-optimization program which includes automated exposure control, adjustment of the mA and/or kV according to patient size and/or use of iterative reconstruction technique. CONTRAST:  75mL OMNIPAQUE IOHEXOL 350 MG/ML SOLN COMPARISON:  CT angiography chest from 12/13/2022. FINDINGS: Cardiovascular: Redemonstration of large clot in the main right pulmonary artery, essentially similar to the prior study. There is extension of the clot into the right upper lobe lobar pulmonary artery, that is also unchanged. However, there is interval increase in the peripheral nonocclusive thrombus in the left lung lower lobe lobar pulmonary artery when compared to the prior exam. It is unclear whether this is acute or subacute, on the basis of this exam. There are additional, stable, several linear web like filling defects in the bilateral lower lobe pulmonary artery branches, compatible with chronic emboli. Redemonstration of compressed left ventricle, compatible with right heart failure/strain. There is dilation of the main pulmonary trunk measuring up to 3.7 cm, which is nonspecific but can be seen with pulmonary artery hypertension. Normal cardiac size. No pericardial effusion. No aortic aneurysm. Mediastinum/Nodes: Visualized thyroid gland appears grossly unremarkable. No solid / cystic mediastinal masses. The esophagus is nondistended precluding optimal assessment. No axillary, mediastinal or hilar lymphadenopathy by size criteria. Lungs/Pleura: The central tracheo-bronchial tree is patent. No mass or consolidation. No pleural effusion or pneumothorax. No suspicious lung nodules. Upper Abdomen: Visualized upper abdominal viscera within normal  limits. Suprarenal IVC filter again noted. Musculoskeletal: Redemonstration of multiple dilated venous collaterals along the anterior chest wall. The visualized soft tissues of the chest wall are otherwise grossly unremarkable. No suspicious osseous lesions. There are mild multilevel degenerative changes in the visualized spine. Review of the MIP images confirms the above findings. IMPRESSION: 1. Redemonstration of large clot in the main right pulmonary artery, essentially similar to the prior study. There is extension of the clot into the right upper lobe lobar pulmonary artery, that is also unchanged. However, there is interval increase in the peripheral nonocclusive thrombus in the left lung lower lobe lobar pulmonary artery when compared to the prior exam. 2. Redemonstration of compressed left ventricle, compatible with right heart failure/strain. 3. Dilation of the main pulmonary trunk measuring up to 3.7 cm, which is nonspecific but can be seen with pulmonary artery hypertension. 4. Multiple other nonacute observations, as described above. Electronically Signed   By: Jules Schick M.D.   On: 05/16/2023 12:18   DG Chest 2 View  Result Date: 05/16/2023 CLINICAL DATA:  Shortness of breath for several days. Chills. Fatigue. Hypoxia. EXAM: CHEST - 2 VIEW COMPARISON:  03/07/2023 FINDINGS: The heart size and mediastinal contours are within normal limits. Both lungs are clear. The visualized skeletal structures are unremarkable. IMPRESSION: No active cardiopulmonary disease. Electronically Signed   By: Danae Orleans M.D.   On: 05/16/2023 09:40    Microbiology: Results for orders placed or performed during the hospital encounter of 12/13/22  Resp panel by RT-PCR (RSV, Flu A&B, Covid) Anterior Nasal Swab     Status: None   Collection Time: 12/13/22  8:22 AM   Specimen: Anterior Nasal Swab  Result Value Ref Range Status   SARS Coronavirus 2 by RT PCR NEGATIVE NEGATIVE Final    Comment: (NOTE) SARS-CoV-2  target nucleic acids are NOT DETECTED.  The SARS-CoV-2 RNA is generally  detectable in upper respiratory specimens during the acute phase of infection. The lowest concentration of SARS-CoV-2 viral copies this assay can detect is 138 copies/mL. A negative result does not preclude SARS-Cov-2 infection and should not be used as the sole basis for treatment or other patient management decisions. A negative result may occur with  improper specimen collection/handling, submission of specimen other than nasopharyngeal swab, presence of viral mutation(s) within the areas targeted by this assay, and inadequate number of viral copies(<138 copies/mL). A negative result must be combined with clinical observations, patient history, and epidemiological information. The expected result is Negative.  Fact Sheet for Patients:  BloggerCourse.com  Fact Sheet for Healthcare Providers:  SeriousBroker.it  This test is no t yet approved or cleared by the [place] FDA and  has been authorized for detection and/or diagnosis of SARS-CoV-2 by FDA under an Emergency Use Authorization (EUA). This EUA will remain  in effect (meaning this test can be used) for the duration of the COVID-19 declaration under Section 564(b)(1) of the Act, 21 U.S.C.section 360bbb-3(b)(1), unless the authorization is terminated  or revoked sooner.       Influenza A by PCR NEGATIVE NEGATIVE Final   Influenza B by PCR NEGATIVE NEGATIVE Final    Comment: (NOTE) The Xpert Xpress SARS-CoV-2/FLU/RSV plus assay is intended as an aid in the diagnosis of influenza from Nasopharyngeal swab specimens and should not be used as a sole basis for treatment. Nasal washings and aspirates are unacceptable for Xpert Xpress SARS-CoV-2/FLU/RSV testing.  Fact Sheet for Patients: BloggerCourse.com  Fact Sheet for Healthcare  Providers: SeriousBroker.it  This test is not yet approved or cleared by the [place] FDA and has been authorized for detection and/or diagnosis of SARS-CoV-2 by FDA under an Emergency Use Authorization (EUA). This EUA will remain in effect (meaning this test can be used) for the duration of the COVID-19 declaration under Section 564(b)(1) of the Act, 21 U.S.C. section 360bbb-3(b)(1), unless the authorization is terminated or revoked.     Resp Syncytial Virus by PCR NEGATIVE NEGATIVE Final    Comment: (NOTE) Fact Sheet for Patients: BloggerCourse.com  Fact Sheet for Healthcare Providers: SeriousBroker.it  This test is not yet approved or cleared by the [place] FDA and has been authorized for detection and/or diagnosis of SARS-CoV-2 by FDA under an Emergency Use Authorization (EUA). This EUA will remain in effect (meaning this test can be used) for the duration of the COVID-19 declaration under Section 564(b)(1) of the Act, 21 U.S.C. section 360bbb-3(b)(1), unless the authorization is terminated or revoked.  Performed at [facility], [address] [place], [place] [address]     Labs: CBC: Recent Labs  Lab 05/19/23 0422 05/20/23 0347 05/21/23 0534 05/24/23 0704  WBC 4.1 4.1 3.9* 3.9*  HGB 13.9 13.3 13.5 14.4  HCT 42.8 40.5 40.2 43.4  MCV 91.1 91.4 88.9 91.4  PLT 162 180 222 340   Basic Metabolic Panel: Recent Labs  Lab 05/20/23 0347  NA 136  K 3.6  CL 103  CO2 24  GLUCOSE 106*  BUN 18  CREATININE 1.43*  CALCIUM 8.1*   Liver Function Tests: No results for input(s): "AST", "ALT", "ALKPHOS", "BILITOT", "PROT", "ALBUMIN" in the last 168 hours. CBG: No results for input(s): "GLUCAP" in the last 168 hours.  Discharge time spent: greater than 30 minutes.  Signed: Lurene Shadow, MD [facility] 05/25/2023

## 2023-05-26 ENCOUNTER — Encounter: Payer: No Typology Code available for payment source | Admitting: Physician Assistant

## 2023-05-26 ENCOUNTER — Telehealth: Payer: Self-pay

## 2023-05-26 DIAGNOSIS — L97822 Non-pressure chronic ulcer of other part of left lower leg with fat layer exposed: Secondary | ICD-10-CM | POA: Diagnosis not present

## 2023-05-26 DIAGNOSIS — I5042 Chronic combined systolic (congestive) and diastolic (congestive) heart failure: Secondary | ICD-10-CM | POA: Diagnosis not present

## 2023-05-26 DIAGNOSIS — I87332 Chronic venous hypertension (idiopathic) with ulcer and inflammation of left lower extremity: Secondary | ICD-10-CM | POA: Diagnosis not present

## 2023-05-26 DIAGNOSIS — L97522 Non-pressure chronic ulcer of other part of left foot with fat layer exposed: Secondary | ICD-10-CM | POA: Diagnosis not present

## 2023-05-26 DIAGNOSIS — E1122 Type 2 diabetes mellitus with diabetic chronic kidney disease: Secondary | ICD-10-CM | POA: Diagnosis not present

## 2023-05-26 DIAGNOSIS — E11622 Type 2 diabetes mellitus with other skin ulcer: Secondary | ICD-10-CM | POA: Diagnosis present

## 2023-05-26 DIAGNOSIS — I890 Lymphedema, not elsewhere classified: Secondary | ICD-10-CM | POA: Diagnosis not present

## 2023-05-26 DIAGNOSIS — E11621 Type 2 diabetes mellitus with foot ulcer: Secondary | ICD-10-CM | POA: Diagnosis not present

## 2023-05-26 DIAGNOSIS — N183 Chronic kidney disease, stage 3 unspecified: Secondary | ICD-10-CM | POA: Diagnosis not present

## 2023-05-26 DIAGNOSIS — I130 Hypertensive heart and chronic kidney disease with heart failure and stage 1 through stage 4 chronic kidney disease, or unspecified chronic kidney disease: Secondary | ICD-10-CM | POA: Diagnosis not present

## 2023-05-26 NOTE — Transitions of Care (Post Inpatient/ED Visit) (Signed)
05/26/2023  Name: Ryan [name] MRN: [id number] DOB: November 29, 1970  Today's TOC FU Call Status: Today's TOC FU Call Status:: Successful TOC FU Call Completed TOC FU Call Complete Date: 05/26/23 Patient's Name and Date of Birth confirmed.  Transition Care Management Follow-up Telephone Call Date of Discharge: 05/25/23 Discharge Facility: [facility]) Type of Discharge: Inpatient Admission Primary Inpatient Discharge Diagnosis:: PE How have you been since you were released from the hospital?: Better Any questions or concerns?: No  Items Reviewed: Did you receive and understand the discharge instructions provided?: Yes Medications obtained,verified, and reconciled?: Yes (Medications Reviewed) Any new allergies since your discharge?: No Dietary orders reviewed?: Yes Do you have support at home?: Yes People in Home: child(ren), adult  Medications Reviewed Today: Medications Reviewed Today     Reviewed by Karena Addison, LPN (Licensed Practical Nurse) on 05/26/23 at 1001  Med List Status: <None>   Medication Order Taking? Sig Documenting Provider Last Dose Status Informant  albuterol (VENTOLIN HFA) 108 (90 Base) MCG/ACT inhaler [id number] No Inhale 2 puffs into the lungs every 6 (six) hours as needed for wheezing or shortness of breath. Sunnie Nielsen, DO prn unk Active Self  furosemide (LASIX) 40 MG tablet [id number] No Take 1 tablet (40 mg total) by mouth every other day. Mecum, Oswaldo Conroy, PA-C 05/12/2023 Active Self  magnesium oxide (MAG-OX) 400 (240 Mg) MG tablet [id number] No Take 400 mg by mouth daily. [provider] Past Month Active Self  metoprolol succinate (TOPROL-XL) 25 MG 24 hr tablet [id number] No Take 1 tablet (25 mg total) by mouth daily. Sunnie Nielsen, DO 05/16/2023 Active Self  OXYGEN [id number] No Inhale 1 L into the lungs daily. [provider] Taking Active Self  sacubitril-valsartan (ENTRESTO) 24-26 MG [id number] No Take  1 tablet by mouth 2 (two) times daily. Delma Freeze, [place] 05/16/2023 Active Self  tirzepatide [facility]) 5 MG/0.5ML Pen [id number] No Inject 5 mg into the skin once a week. [name], NP 05/09/2023 Active Self  warfarin (COUMADIN) 5 MG tablet [id number] No Take 1 tablet (5 mg total) by mouth daily. [name], NP 05/11/2023 Active Self            Home Care and Equipment/Supplies: Were Home Health Services Ordered?: NA Any new equipment or medical supplies ordered?: NA  Functional Questionnaire: Do you need assistance with bathing/showering or dressing?: No Do you need assistance with meal preparation?: No Do you need assistance with eating?: No Do you have difficulty maintaining continence: No Do you need assistance with getting out of bed/getting out of a chair/moving?: No Do you have difficulty managing or taking your medications?: No  Follow up appointments reviewed: PCP Follow-up appointment confirmed?: Yes MD Provider Line [phone] Given: No Date of PCP follow-up appointment?: 06/02/23 Follow-up Provider: [facility] Follow-up appointment confirmed?: NA Do you need transportation to your follow-up appointment?: No Do you understand care options if your condition(s) worsen?: Yes-patient verbalized understanding    SIGNATURE Karena Addison, LPN [facility] Nurse Health Advisor Direct Dial [phone]

## 2023-05-26 NOTE — Progress Notes (Addendum)
and had to be removed. He is now on additional blood thinners and states that he is in [name] good place as far as the blood thinners are concerned. He is feeling much better as well able to breathe quite  nicely. I am very pleased with where things stand currently and do not see any signs of infection at this time which is also good news. Electronic Signature(s) Signed: 05/26/2023 8:56:49 AM By: Ryan Derry PA-C Entered By: Ryan [name] on 05/26/2023 05:56:49 -------------------------------------------------------------------------------- Physical Exam Details Patient Name: Date of Service: Ryan [name], Ryan RLES [name]. 05/26/2023 7:45 [name] Medical Record Number: [id number] Patient Account Number: [id number] Date of Birth/Sex: Treating RN: [birth year] (52 y.o. Ryan [name] Primary Care Provider: Larae [name] Other Clinician: Referring Provider: Treating Provider/Extender: Ryan [name] in Treatment: 5 Constitutional Obese and well-hydrated in no acute distress. Respiratory normal breathing without difficulty. Psychiatric this patient is able to make decisions and demonstrates good insight into disease process. Alert and Oriented x 3. pleasant and cooperative. Notes Upon inspection patient's wound bed actually showed signs of good granulation epithelization at this point. Fortunately I do not see any signs of worsening overall and I do believe that the patient is making good headway towards complete closure which is great news. The leg actually looks to be doing excellent. Electronic Signature(s) Signed: 05/26/2023 8:57:07 AM By: Ryan Derry PA-C Entered By: Ryan [name] on 05/26/2023 05:57:07 -------------------------------------------------------------------------------- Physician Orders Details Patient Name: Date of Service: Ryan [name]. 05/26/2023 7:45 [name] Medical Record Number: [id number] Patient Account Number: [id number] Date of Birth/Sex: Treating RN: November 05, 1970 (52 y.o. 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 Bathing/ Shower/ Hygiene May shower with wound dressing protected with water repellent cover or cast protector. Anesthetic (Use 'Patient Medications' Section for Anesthetic Order Entry) Lidocaine applied to wound bed Edema Control - Lymphedema / Segmental Compressive Device / Other UrgoK2 - Large left leg Tubigrip double layer applied - FF right leg Elevate, Exercise Daily and [name] void Standing for Long Periods of Time. Elevate legs to the level of the heart and pump ankles as often as possible Elevate leg(s) parallel to the floor when sitting. Wound Treatment Wound #1 - Lower Leg Wound Laterality: Left, Medial Cleanser: Soap and Water 1 x Per Week/30 Days Discharge Instructions: Gently cleanse wound with antibacterial soap, rinse and pat dry prior to dressing wounds Cleanser: Wound Cleanser 1 x Per Week/30 Days Discharge Instructions: Wash your hands with soap and water. Remove old dressing, discard into plastic bag and place into trash. Cleanse the wound with Wound Cleanser prior to applying [name] clean dressing using gauze sponges, not tissues or cotton  balls. Do not scrub or use excessive force. Dennie Bible  dry using gauze sponges, not tissue or cotton balls. Prim Dressing: Hydrofera Blue Ready Transfer Foam, 2.5x2.5 (in/in) 1 x Per Week/30 Days ary Discharge Instructions: Apply Hydrofera Blue Ready to wound bed as directed Secondary Dressing: ABD Pad 5x9 (in/in) 1 x Per Week/30 Days Discharge Instructions: Cover with ABD pad Compression Wrap: Urgo K2, two layer compression system, large 1 x Per Week/30 Days Wound #2 - Foot Wound Laterality: Left, Medial Cleanser: Soap and Water 1 x Per Week/30 Days Discharge Instructions: Gently cleanse wound with antibacterial soap, rinse and pat dry prior to dressing wounds Cleanser: Wound Cleanser 1 x Per Week/30 Days Discharge Instructions: Wash your hands with soap and water. Remove old dressing, discard into plastic bag and place into trash. Cleanse the wound with Wound Cleanser prior to applying [name] clean dressing using gauze sponges, not tissues or cotton balls. Do not scrub or use excessive force. Pat dry using gauze sponges, not tissue or cotton balls. Prim Dressing: Hydrofera Blue Ready Transfer Foam, 2.5x2.5 (in/in) 1 x Per Week/30 Days ary Discharge Instructions: Apply Hydrofera Blue Ready to wound bed as directed Secondary Dressing: ABD Pad 5x9 (in/in) 1 x Per Week/30 Days Discharge Instructions: Cover with ABD pad Compression Wrap: Urgo K2, two layer compression system, large 1 x Per Week/30 Days Electronic Signature(s) Signed: 05/27/2023 12:30:42 PM By: Ryan Aver MSN RN CNS WTA Signed: 05/27/2023 2:21:22 PM By: Ryan Derry PA-C Entered By: Ryan [name] on 05/26/2023 05:30:23 Ryan [name], Ryan [name] ([id number]) [id number] Page 5 of 9 -------------------------------------------------------------------------------- Problem List Details Patient Name: Date of Service: Ryan [name], Ryan [name]. 05/26/2023 7:45 [name] Medical Record Number: [id number] Patient Account Number: [id number] Date of  Birth/Sex: Treating RN: July 31, 1971 (51 y.o. Ryan [name] Primary Care Provider: Larae [name] Other Clinician: Referring Provider: Treating Provider/Extender: Ryan [name] in Treatment: 5 Active Problems ICD-10 Encounter Code Description Active Date MDM Diagnosis E11.622 Type 2 diabetes mellitus with other skin ulcer 04/18/2023 No Yes L97.822 Non-pressure chronic ulcer of other part of left lower leg with fat layer exposed8/19/2024 No Yes E11.621 Type 2 diabetes mellitus with foot ulcer 04/18/2023 No Yes L97.522 Non-pressure chronic ulcer of other part of left foot with fat layer exposed 04/18/2023 No Yes I50.42 Chronic combined systolic (congestive) and diastolic (congestive) heart failure 04/18/2023 No Yes I89.0 Lymphedema, not elsewhere classified 04/18/2023 No Yes I87.332 Chronic venous hypertension (idiopathic) with ulcer and inflammation of left 04/18/2023 No Yes lower extremity I10 Essential (primary) hypertension 04/18/2023 No Yes I73.89 Other specified peripheral vascular diseases 04/18/2023 No Yes N18.30 Chronic kidney disease, stage 3 unspecified 04/18/2023 No Yes Inactive Problems Resolved Problems Electronic Signature(s) Ryan [name], Ryan [name] ([id number]) [id number] Page 6 of 9 Signed: 05/26/2023 8:14:59 AM By: Ryan Derry PA-C Entered By: Ryan [name] on 05/26/2023 05:14:59 -------------------------------------------------------------------------------- Progress Note Details Patient Name: Date of Service: Ryan [name]. 05/26/2023 7:45 [name] Medical Record Number: [id number] Patient Account Number: [id number] Date of Birth/Sex: Treating RN: December 13, 1970 (52 y.o. Ryan [name] Primary Care Provider: Larae [name] Other Clinician: Referring Provider: Treating Provider/Extender: Ryan [name] in Treatment: 5 Subjective Chief Complaint Information obtained from Patient Left leg and left foot  ulcers History of Present Illness (HPI) Chronic/Inactive Condition: 04-18-2023 patient screening ABI today was 0.83 and this was on the left. With that being said he had previous ABIs done formally on 06-25-2021 with [name] right ABI of 1.12 and [name] TBI of 1.03 on the left she had an ABI of 1.12 with [name] TBI of 0.98.  dry using gauze sponges, not tissue or cotton balls. Prim Dressing: Hydrofera Blue Ready Transfer Foam, 2.5x2.5 (in/in) 1 x Per Week/30 Days ary Discharge Instructions: Apply Hydrofera Blue Ready to wound bed as directed Secondary Dressing: ABD Pad 5x9 (in/in) 1 x Per Week/30 Days Discharge Instructions: Cover with ABD pad Compression Wrap: Urgo K2, two layer compression system, large 1 x Per Week/30 Days Wound #2 - Foot Wound Laterality: Left, Medial Cleanser: Soap and Water 1 x Per Week/30 Days Discharge Instructions: Gently cleanse wound with antibacterial soap, rinse and pat dry prior to dressing wounds Cleanser: Wound Cleanser 1 x Per Week/30 Days Discharge Instructions: Wash your hands with soap and water. Remove old dressing, discard into plastic bag and place into trash. Cleanse the wound with Wound Cleanser prior to applying [name] clean dressing using gauze sponges, not tissues or cotton balls. Do not scrub or use excessive force. Pat dry using gauze sponges, not tissue or cotton balls. Prim Dressing: Hydrofera Blue Ready Transfer Foam, 2.5x2.5 (in/in) 1 x Per Week/30 Days ary Discharge Instructions: Apply Hydrofera Blue Ready to wound bed as directed Secondary Dressing: ABD Pad 5x9 (in/in) 1 x Per Week/30 Days Discharge Instructions: Cover with ABD pad Compression Wrap: Urgo K2, two layer compression system, large 1 x Per Week/30 Days Electronic Signature(s) Signed: 05/27/2023 12:30:42 PM By: Ryan Aver MSN RN CNS WTA Signed: 05/27/2023 2:21:22 PM By: Ryan Derry PA-C Entered By: Ryan [name] on 05/26/2023 05:30:23 Ryan [name], Ryan [name] ([id number]) [id number] Page 5 of 9 -------------------------------------------------------------------------------- Problem List Details Patient Name: Date of Service: Ryan [name], Ryan [name]. 05/26/2023 7:45 [name] Medical Record Number: [id number] Patient Account Number: [id number] Date of  Birth/Sex: Treating RN: July 31, 1971 (51 y.o. Ryan [name] Primary Care Provider: Larae [name] Other Clinician: Referring Provider: Treating Provider/Extender: Ryan [name] in Treatment: 5 Active Problems ICD-10 Encounter Code Description Active Date MDM Diagnosis E11.622 Type 2 diabetes mellitus with other skin ulcer 04/18/2023 No Yes L97.822 Non-pressure chronic ulcer of other part of left lower leg with fat layer exposed8/19/2024 No Yes E11.621 Type 2 diabetes mellitus with foot ulcer 04/18/2023 No Yes L97.522 Non-pressure chronic ulcer of other part of left foot with fat layer exposed 04/18/2023 No Yes I50.42 Chronic combined systolic (congestive) and diastolic (congestive) heart failure 04/18/2023 No Yes I89.0 Lymphedema, not elsewhere classified 04/18/2023 No Yes I87.332 Chronic venous hypertension (idiopathic) with ulcer and inflammation of left 04/18/2023 No Yes lower extremity I10 Essential (primary) hypertension 04/18/2023 No Yes I73.89 Other specified peripheral vascular diseases 04/18/2023 No Yes N18.30 Chronic kidney disease, stage 3 unspecified 04/18/2023 No Yes Inactive Problems Resolved Problems Electronic Signature(s) Ryan [name], Ryan [name] ([id number]) [id number] Page 6 of 9 Signed: 05/26/2023 8:14:59 AM By: Ryan Derry PA-C Entered By: Ryan [name] on 05/26/2023 05:14:59 -------------------------------------------------------------------------------- Progress Note Details Patient Name: Date of Service: Ryan [name]. 05/26/2023 7:45 [name] Medical Record Number: [id number] Patient Account Number: [id number] Date of Birth/Sex: Treating RN: December 13, 1970 (52 y.o. Ryan [name] Primary Care Provider: Larae [name] Other Clinician: Referring Provider: Treating Provider/Extender: Ryan [name] in Treatment: 5 Subjective Chief Complaint Information obtained from Patient Left leg and left foot  ulcers History of Present Illness (HPI) Chronic/Inactive Condition: 04-18-2023 patient screening ABI today was 0.83 and this was on the left. With that being said he had previous ABIs done formally on 06-25-2021 with [name] right ABI of 1.12 and [name] TBI of 1.03 on the left she had an ABI of 1.12 with [name] TBI of 0.98.  as directed Secondary Dressing: ABD Pad 5x9 (in/in) 1 x Per Week/30 Days Discharge Instructions: Cover with ABD pad Com pression Wrap: Urgo K2, two layer compression system, large 1 x Per Week/30 Days 1. I am going to recommend that we have the patient continue to monitor for any signs of infection or worsening based on what I am seeing I do believe that he is doing well with the [facility] 2. Recommend continue with ABD pads to cover followed  by the Urgo K2 compression wrap. 3. Will also can initiate treatment with Tubigrip size F to the right leg to try to see if we get some of the swelling down we can give him measurements for compression socks next week. We will see patient back for reevaluation in 1 week here in the clinic. If anything worsens or changes patient will contact our office for additional recommendations. Electronic Signature(s) Signed: 05/26/2023 8:57:36 AM By: Ryan Derry PA-C Entered By: Ryan [name] on 05/26/2023 05:57:36 -------------------------------------------------------------------------------- SuperBill Details Patient Name: Date of Service: Ryan [name]. 05/26/2023 Medical Record Number: [id number] Patient Account Number: [id number] Date of Birth/Sex: Treating RN: 12-15-1970 (52 y.o. 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 Lymphedema, not elsewhere classified 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 Ryan [name], Ryan [name] ([id number]) [id number] Page 9 of 9 I10 Essential (primary) hypertension I73.89 Other specified peripheral vascular diseases N18.30 Chronic kidney disease, stage 3 unspecified Facility Procedures : CPT4 Code: [id number] Description: 11042 - DEB SUBQ TISSUE 20 SQ CM/< ICD-10 Diagnosis Description L97.822 Non-pressure chronic ulcer of other part of left  lower leg with fat layer expo Modifier: sed Quantity: 1 Physician Procedures : CPT4 Code Description Modifier [id number] 11042 - WC PHYS SUBQ TISS 20 SQ CM ICD-10 Diagnosis Description L97.822 Non-pressure chronic ulcer of other part of left lower leg with fat layer exposed Quantity: 1 Electronic Signature(s) Signed: 05/26/2023 9:02:45 AM By: Ryan Derry PA-C Entered By: Ryan [name] on 05/26/2023 06:02:44  Ryan [name], Ryan [name] ([id number]) [id number] Page 1 of 9 Visit Report for 05/26/2023 Chief Complaint Document Details Patient Name: Date of Service: Ryan [name], Ryan [name]. 05/26/2023 7:45 [name] Medical Record Number: [id number] Patient Account Number: [id number] Date of Birth/Sex: Treating RN: June 13, 1971 (52 y.o. Ryan [name] Primary Care Provider: Larae [name] Other Clinician: Referring Provider: Treating Provider/Extender: Ryan [name] in Treatment: 5 Information Obtained from: Patient Chief Complaint Left leg and left foot ulcers Electronic Signature(s) Signed: 05/26/2023 8:15:07 AM By: Ryan Derry PA-C Entered By: Ryan [name] on 05/26/2023 05:15:06 -------------------------------------------------------------------------------- Debridement Details Patient Name: Date of Service: Ryan [name]. 05/26/2023 7:45 [name] Medical Record Number: [id number] Patient Account Number: [id number] Date of Birth/Sex: Treating RN: [birth year] (52 y.o. Ryan [name] Primary Care Provider: Larae [name] Other Clinician: Referring Provider: Treating Provider/Extender: Ryan [name] in Treatment: 5 Debridement Performed for Assessment: Wound #2 Left,Medial Foot Performed By: Physician Ryan Derry, PA-C Debridement Type: Debridement Severity of Tissue Pre Debridement: Fat layer exposed Level of Consciousness (Pre-procedure): Awake and Alert Pre-procedure Verification/Time Out Yes - 08:26 Taken: Start Time: 08:26 Pain Control: Lidocaine 4% T opical Solution Percent of Wound Bed Debrided: 100% T Area Debrided (cm): otal 0.33 Tissue and other material debrided: Viable, Non-Viable, Slough, Subcutaneous, Slough Level: Skin/Subcutaneous Tissue Debridement Description: Excisional Instrument: Curette Bleeding: Minimum Hemostasis Achieved: Pressure Procedural Pain: 0 Post Procedural Pain: 0 Ryan [name], Ryan [name] ([id number])  [id number] Page 2 of 9 Response to Treatment: Procedure was tolerated well Level of Consciousness (Post- Awake and Alert procedure): Post Debridement Measurements of Total Wound Length: (cm) 0.3 Width: (cm) 1.4 Depth: (cm) 0.2 Volume: (cm) 0.066 Character of Wound/Ulcer Post Debridement: Stable Severity of Tissue Post Debridement: Fat layer exposed Post Procedure Diagnosis Same as Pre-procedure Electronic Signature(s) Signed: 05/27/2023 12:30:42 PM By: Ryan Aver MSN RN CNS WTA Signed: 05/27/2023 2:21:22 PM By: Ryan Derry PA-C Entered By: Ryan [name] on 05/26/2023 05:28:23 -------------------------------------------------------------------------------- HPI Details Patient Name: Date of Service: Ryan [name]. 05/26/2023 7:45 [name] Medical Record Number: [id number] Patient Account Number: [id number] Date of Birth/Sex: Treating RN: 1971-02-16 (52 y.o. Ryan [name] Primary Care Provider: Larae [name] Other Clinician: Referring Provider: Treating Provider/Extender: Ryan [name] in Treatment: 5 History of Present Illness Chronic/Inactive Conditions Condition 1: 04-18-2023 patient screening ABI today was 0.83 and this was on the left. With that being said he had previous ABIs done formally on 06-25-2021 with [name] right ABI of 1.12 and [name] TBI of 1.03 on the left she had an ABI of 1.12 with [name] TBI of 0.98. HPI Description: 04-18-2023 upon evaluation today patient presents for initial inspection here in our clinic some concerning issues he is having with his left proximal foot as well as his left lower extremity. This actually appears to be more of [name] situation to be honest. He does have chronic lower extremity swelling secondary to what appears to be venous stasis he has not really had any wounds significantly before and tells me that it has been [name] probably even 12 or so since he has last used his  lymphedema pumps. He is not even sure if they are actually functioning anymore or not. With that being said this is 1 thing that I did want him to look into. He is on [name] fluid pill and he does not wear compression socks on [name] regular basis although I definitely think that something that he also really needs to be  and had to be removed. He is now on additional blood thinners and states that he is in [name] good place as far as the blood thinners are concerned. He is feeling much better as well able to breathe quite  nicely. I am very pleased with where things stand currently and do not see any signs of infection at this time which is also good news. Electronic Signature(s) Signed: 05/26/2023 8:56:49 AM By: Ryan Derry PA-C Entered By: Ryan [name] on 05/26/2023 05:56:49 -------------------------------------------------------------------------------- Physical Exam Details Patient Name: Date of Service: Ryan [name], Ryan RLES [name]. 05/26/2023 7:45 [name] Medical Record Number: [id number] Patient Account Number: [id number] Date of Birth/Sex: Treating RN: [birth year] (52 y.o. Ryan [name] Primary Care Provider: Larae [name] Other Clinician: Referring Provider: Treating Provider/Extender: Ryan [name] in Treatment: 5 Constitutional Obese and well-hydrated in no acute distress. Respiratory normal breathing without difficulty. Psychiatric this patient is able to make decisions and demonstrates good insight into disease process. Alert and Oriented x 3. pleasant and cooperative. Notes Upon inspection patient's wound bed actually showed signs of good granulation epithelization at this point. Fortunately I do not see any signs of worsening overall and I do believe that the patient is making good headway towards complete closure which is great news. The leg actually looks to be doing excellent. Electronic Signature(s) Signed: 05/26/2023 8:57:07 AM By: Ryan Derry PA-C Entered By: Ryan [name] on 05/26/2023 05:57:07 -------------------------------------------------------------------------------- Physician Orders Details Patient Name: Date of Service: Ryan [name]. 05/26/2023 7:45 [name] Medical Record Number: [id number] Patient Account Number: [id number] Date of Birth/Sex: Treating RN: November 05, 1970 (52 y.o. 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 Bathing/ Shower/ Hygiene May shower with wound dressing protected with water repellent cover or cast protector. Anesthetic (Use 'Patient Medications' Section for Anesthetic Order Entry) Lidocaine applied to wound bed Edema Control - Lymphedema / Segmental Compressive Device / Other UrgoK2 - Large left leg Tubigrip double layer applied - FF right leg Elevate, Exercise Daily and [name] void Standing for Long Periods of Time. Elevate legs to the level of the heart and pump ankles as often as possible Elevate leg(s) parallel to the floor when sitting. Wound Treatment Wound #1 - Lower Leg Wound Laterality: Left, Medial Cleanser: Soap and Water 1 x Per Week/30 Days Discharge Instructions: Gently cleanse wound with antibacterial soap, rinse and pat dry prior to dressing wounds Cleanser: Wound Cleanser 1 x Per Week/30 Days Discharge Instructions: Wash your hands with soap and water. Remove old dressing, discard into plastic bag and place into trash. Cleanse the wound with Wound Cleanser prior to applying [name] clean dressing using gauze sponges, not tissues or cotton  balls. Do not scrub or use excessive force. Dennie Bible  as directed Secondary Dressing: ABD Pad 5x9 (in/in) 1 x Per Week/30 Days Discharge Instructions: Cover with ABD pad Com pression Wrap: Urgo K2, two layer compression system, large 1 x Per Week/30 Days 1. I am going to recommend that we have the patient continue to monitor for any signs of infection or worsening based on what I am seeing I do believe that he is doing well with the [facility] 2. Recommend continue with ABD pads to cover followed  by the Urgo K2 compression wrap. 3. Will also can initiate treatment with Tubigrip size F to the right leg to try to see if we get some of the swelling down we can give him measurements for compression socks next week. We will see patient back for reevaluation in 1 week here in the clinic. If anything worsens or changes patient will contact our office for additional recommendations. Electronic Signature(s) Signed: 05/26/2023 8:57:36 AM By: Ryan Derry PA-C Entered By: Ryan [name] on 05/26/2023 05:57:36 -------------------------------------------------------------------------------- SuperBill Details Patient Name: Date of Service: Ryan [name]. 05/26/2023 Medical Record Number: [id number] Patient Account Number: [id number] Date of Birth/Sex: Treating RN: 12-15-1970 (52 y.o. 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 Lymphedema, not elsewhere classified 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 Ryan [name], Ryan [name] ([id number]) [id number] Page 9 of 9 I10 Essential (primary) hypertension I73.89 Other specified peripheral vascular diseases N18.30 Chronic kidney disease, stage 3 unspecified Facility Procedures : CPT4 Code: [id number] Description: 11042 - DEB SUBQ TISSUE 20 SQ CM/< ICD-10 Diagnosis Description L97.822 Non-pressure chronic ulcer of other part of left  lower leg with fat layer expo Modifier: sed Quantity: 1 Physician Procedures : CPT4 Code Description Modifier [id number] 11042 - WC PHYS SUBQ TISS 20 SQ CM ICD-10 Diagnosis Description L97.822 Non-pressure chronic ulcer of other part of left lower leg with fat layer exposed Quantity: 1 Electronic Signature(s) Signed: 05/26/2023 9:02:45 AM By: Ryan Derry PA-C Entered By: Ryan [name] on 05/26/2023 06:02:44  Ryan [name], Ryan [name] ([id number]) [id number] Page 1 of 9 Visit Report for 05/26/2023 Chief Complaint Document Details Patient Name: Date of Service: Ryan [name], Ryan [name]. 05/26/2023 7:45 [name] Medical Record Number: [id number] Patient Account Number: [id number] Date of Birth/Sex: Treating RN: June 13, 1971 (52 y.o. Ryan [name] Primary Care Provider: Larae [name] Other Clinician: Referring Provider: Treating Provider/Extender: Ryan [name] in Treatment: 5 Information Obtained from: Patient Chief Complaint Left leg and left foot ulcers Electronic Signature(s) Signed: 05/26/2023 8:15:07 AM By: Ryan Derry PA-C Entered By: Ryan [name] on 05/26/2023 05:15:06 -------------------------------------------------------------------------------- Debridement Details Patient Name: Date of Service: Ryan [name]. 05/26/2023 7:45 [name] Medical Record Number: [id number] Patient Account Number: [id number] Date of Birth/Sex: Treating RN: [birth year] (52 y.o. Ryan [name] Primary Care Provider: Larae [name] Other Clinician: Referring Provider: Treating Provider/Extender: Ryan [name] in Treatment: 5 Debridement Performed for Assessment: Wound #2 Left,Medial Foot Performed By: Physician Ryan Derry, PA-C Debridement Type: Debridement Severity of Tissue Pre Debridement: Fat layer exposed Level of Consciousness (Pre-procedure): Awake and Alert Pre-procedure Verification/Time Out Yes - 08:26 Taken: Start Time: 08:26 Pain Control: Lidocaine 4% T opical Solution Percent of Wound Bed Debrided: 100% T Area Debrided (cm): otal 0.33 Tissue and other material debrided: Viable, Non-Viable, Slough, Subcutaneous, Slough Level: Skin/Subcutaneous Tissue Debridement Description: Excisional Instrument: Curette Bleeding: Minimum Hemostasis Achieved: Pressure Procedural Pain: 0 Post Procedural Pain: 0 Ryan [name], Ryan [name] ([id number])  [id number] Page 2 of 9 Response to Treatment: Procedure was tolerated well Level of Consciousness (Post- Awake and Alert procedure): Post Debridement Measurements of Total Wound Length: (cm) 0.3 Width: (cm) 1.4 Depth: (cm) 0.2 Volume: (cm) 0.066 Character of Wound/Ulcer Post Debridement: Stable Severity of Tissue Post Debridement: Fat layer exposed Post Procedure Diagnosis Same as Pre-procedure Electronic Signature(s) Signed: 05/27/2023 12:30:42 PM By: Ryan Aver MSN RN CNS WTA Signed: 05/27/2023 2:21:22 PM By: Ryan Derry PA-C Entered By: Ryan [name] on 05/26/2023 05:28:23 -------------------------------------------------------------------------------- HPI Details Patient Name: Date of Service: Ryan [name]. 05/26/2023 7:45 [name] Medical Record Number: [id number] Patient Account Number: [id number] Date of Birth/Sex: Treating RN: 1971-02-16 (52 y.o. Ryan [name] Primary Care Provider: Larae [name] Other Clinician: Referring Provider: Treating Provider/Extender: Ryan [name] in Treatment: 5 History of Present Illness Chronic/Inactive Conditions Condition 1: 04-18-2023 patient screening ABI today was 0.83 and this was on the left. With that being said he had previous ABIs done formally on 06-25-2021 with [name] right ABI of 1.12 and [name] TBI of 1.03 on the left she had an ABI of 1.12 with [name] TBI of 0.98. HPI Description: 04-18-2023 upon evaluation today patient presents for initial inspection here in our clinic some concerning issues he is having with his left proximal foot as well as his left lower extremity. This actually appears to be more of [name] situation to be honest. He does have chronic lower extremity swelling secondary to what appears to be venous stasis he has not really had any wounds significantly before and tells me that it has been [name] probably even 12 or so since he has last used his  lymphedema pumps. He is not even sure if they are actually functioning anymore or not. With that being said this is 1 thing that I did want him to look into. He is on [name] fluid pill and he does not wear compression socks on [name] regular basis although I definitely think that something that he also really needs to be

## 2023-05-27 NOTE — Progress Notes (Signed)
Ryan [name], BIR [name] ([id number]) [id number] Page 1 of 11 Visit Report for 05/26/2023 Arrival Information Details Patient Name: Date of Service: Ryan [name], Ryan [name]. 05/26/2023 7:45 [name] Medical Record Number: [id number] Patient Account Number: [id number] Date of Birth/Sex: Treating RN: Oct 26, 1970 (52 y.o. Ryan [name] Primary Care Kayleigh Broadwell: [name] Other Clinician: Referring Platon Arocho: Treating Jaxxen Voong/Extender: [name] in Treatment: 5 Visit Information History Since Last Visit Added or deleted any medications: No Patient Arrived: Ambulatory Any new allergies or adverse reactions: No Arrival Time: 07:56 Hospitalized since last visit: Yes Accompanied By: self Has Dressing in Place as Prescribed: Yes Transfer Assistance: None Pain Present Now: No Patient Identification Verified: Yes Secondary Verification Process Completed: Yes Patient Requires Transmission-Based Precautions: No Patient Has Alerts: Yes Patient Alerts: Patient on Blood Thinner diabetic Electronic Signature(s) Signed: 05/26/2023 9:09:40 AM By: Midge Aver MSN RN CNS WTA Entered By: Midge Aver on 05/26/2023 06:09:39 -------------------------------------------------------------------------------- Clinic Level of Care Assessment Details Patient Name: Date of Service: Ryan [name], Ryan [name]. 05/26/2023 7:45 [name] Medical Record Number: [id number] Patient Account Number: [id number] Date of Birth/Sex: Treating RN: 1971-01-02 (52 y.o. Ryan [name] Primary Care Saniyya Gau: [name] Other Clinician: Referring Katharina Jehle: Treating Starlena Beil/Extender: [name] in Treatment: 5 Clinic Level of Care Assessment Items TOOL 1 Quantity Score []  - 0 Use when EandM and Procedure is performed on INITIAL visit ASSESSMENTS - Nursing Assessment / Reassessment []  - 0 General Physical Exam (combine w/ comprehensive assessment (listed just below) when  performed on new pt. evals) []  - 0 Comprehensive Assessment (HX, ROS, Risk Assessments, Wounds Hx, etc.) ASSESSMENTS - Wound and Skin Assessment / Reassessment []  - 0 Dermatologic / Skin Assessment (not related to wound area) Agcaoili, Ryan [name] ([id number]) [id number] Page 2 of 11 ASSESSMENTS - Ostomy and/or Continence Assessment and Care []  - 0 Incontinence Assessment and Management []  - 0 Ostomy Care Assessment and Management (repouching, etc.) PROCESS - Coordination of Care []  - 0 Simple Patient / Family Education for ongoing care []  - 0 Complex (extensive) Patient / Family Education for ongoing care []  - 0 Staff obtains Chiropractor, Records, T Results / Process Orders est []  - 0 Staff telephones HHA, Nursing Homes / Clarify orders / etc []  - 0 Routine Transfer to another Facility (non-emergent condition) []  - 0 Routine Hospital Admission (non-emergent condition) []  - 0 New Admissions / Manufacturing engineer / Ordering NPWT Apligraf, etc. , []  - 0 Emergency Hospital Admission (emergent condition) PROCESS - Special Needs []  - 0 Pediatric / Minor Patient Management []  - 0 Isolation Patient Management []  - 0 Hearing / Language / Visual special needs []  - 0 Assessment of Community assistance (transportation, D/C planning, etc.) []  - 0 Additional assistance / Altered mentation []  - 0 Support Surface(s) Assessment (bed, cushion, seat, etc.) INTERVENTIONS - Miscellaneous []  - 0 External ear exam []  - 0 Patient Transfer (multiple staff / Nurse, adult / Similar devices) []  - 0 Simple Staple / Suture removal (25 or less) []  - 0 Complex Staple / Suture removal (26 or more) []  - 0 Hypo/Hyperglycemic Management (do not check if billed separately) []  - 0 Ankle / Brachial Index (ABI) - do not check if billed separately Has the patient been seen at the hospital within the last three years: Yes Total Score: 0 Level Of Care: ____ Electronic  Signature(s) Signed: 05/27/2023 12:30:42 PM By: Midge Aver MSN RN CNS WTA Entered By: Midge Aver on 05/26/2023 05:30:31 -------------------------------------------------------------------------------- Compression Therapy Details Patient  Midge Aver on 05/26/2023 05:45:39 Ryan [name], Ryan [name] ([id number]) [id number]  Page 8 of 11 -------------------------------------------------------------------------------- Wound Assessment Details Patient Name: Date of Service: Ryan [name], Ryan [name]. 05/26/2023 7:45 [name] Medical Record Number: [id number] Patient Account Number: [id number] Date of Birth/Sex: Treating RN: 05/15/1971 (52 y.o. Ryan [name] Primary Care Navin Dogan: [name] Other Clinician: Referring Khallid Pasillas: Treating Chelci Wintermute/Extender: [name] in Treatment: 5 Wound Status Wound Number: 1 Primary Diabetic Wound/Ulcer of the Lower Extremity Etiology: Wound Location: Left, Medial Lower Leg Wound Open Wounding Event: Gradually Appeared Status: Date Acquired: 02/28/2023 Comorbid Arrhythmia, Congestive Heart Failure, Hypertension, Peripheral Weeks Of Treatment: 5 History: Venous Disease, Type II Diabetes Clustered Wound: No Photos Wound Measurements Length: (cm) 2.3 Width: (cm) 1.5 Depth: (cm) 0.2 Area: (cm) 2.71 Volume: (cm) 0.542 % Reduction in Area: 31% % Reduction in Volume: 54% Epithelialization: None Wound Description Classification: Grade 2 Exudate Amount: Medium Exudate Type: Serosanguineous Exudate Color: red, brown Foul Odor After Cleansing: No Slough/Fibrino Yes Wound Bed Granulation Amount: Large (67-100%) Exposed Structure Granulation Quality: Pink Fascia Exposed: No Necrotic Amount: Small (1-33%) Fat Layer (Subcutaneous Tissue) Exposed: Yes Necrotic Quality: Adherent Slough Tendon Exposed: No Muscle Exposed: No Joint Exposed: No Bone Exposed: No Treatment Notes Wound #1 (Lower Leg) Wound Laterality: Left, Medial Cleanser Soap and Water Discharge Instruction: Gently cleanse wound with antibacterial soap, rinse and pat dry prior to dressing wounds Wound Cleanser Cassidy, [name] ([id number]) [id number] Page 9 of 11 Discharge Instruction: Wash your hands with soap and water. Remove old dressing, discard into plastic  bag and place into trash. Cleanse the wound with Wound Cleanser prior to applying [name] clean dressing using gauze sponges, not tissues or cotton balls. Do not scrub or use excessive force. Pat dry using gauze sponges, not tissue or cotton balls. Peri-Wound Care Topical Primary Dressing Hydrofera Blue Ready Transfer Foam, 2.5x2.5 (in/in) Discharge Instruction: Apply Hydrofera Blue Ready to wound bed as directed Secondary Dressing ABD Pad 5x9 (in/in) Discharge Instruction: Cover with ABD pad Secured With Compression Wrap Urgo K2, two layer compression system, large Compression Stockings Add-Ons Electronic Signature(s) Signed: 05/27/2023 12:30:42 PM By: Midge Aver MSN RN CNS WTA Entered By: Midge Aver on 05/26/2023 05:12:22 -------------------------------------------------------------------------------- Wound Assessment Details Patient Name: Date of Service: Ryan [name]. 05/26/2023 7:45 [name] Medical Record Number: [id number] Patient Account Number: [id number] Date of Birth/Sex: Treating RN: May 16, 1971 (52 y.o. Ryan [name] Primary Care Starlyn Droge: [name] Other Clinician: Referring Shiro Ellerman: Treating Yanira Tolsma/Extender: [name] in Treatment: 5 Wound Status Wound Number: 2 Primary Diabetic Wound/Ulcer of the Lower Extremity Etiology: Wound Location: Left, Medial Foot Wound Open Wounding Event: Gradually Appeared Status: Date Acquired: 02/28/2023 Comorbid Arrhythmia, Congestive Heart Failure, Hypertension, Peripheral Weeks Of Treatment: 5 History: Venous Disease, Type II Diabetes Clustered Wound: No Photos Wound Measurements Length: (cm) 0.3 Width: (cm) 1.4 Ryan [name], Ryan [name] ([id number]) Depth: (cm) 0.2 Area: (cm) 0.33 Volume: (cm) 0.066 % Reduction in Area: 87.1% % Reduction in Volume: 87.1% [id number] Page 10 of 11 Epithelialization: None Wound Description Classification: Grade 1 Exudate Amount:  Medium Exudate Type: Serosanguineous Exudate Color: red, brown Foul Odor After Cleansing: No Slough/Fibrino Yes Wound Bed Granulation Amount: Large (67-100%) Exposed Structure Granulation Quality: Pink Fascia Exposed: No Necrotic Amount: Small (1-33%) Fat Layer (Subcutaneous Tissue) Exposed: Yes Tendon Exposed: No Muscle Exposed: No Joint Exposed: No Bone Exposed: No Treatment Notes Wound #2 (Foot) Wound Laterality: Left, Medial Cleanser Soap and Water Discharge Instruction: Gently cleanse wound with antibacterial  N/[name] % Reduction in [name] rea: 54.00% 86.10% N/[name] % Reduction in Volume: Grade 2 Grade 1 N/[name] Classification: Medium Medium N/[name] Exudate [name] mount: Serosanguineous Serosanguineous N/[name] Exudate Type: red, brown red, brown N/[name] Exudate Color: Large (67-100%) Large (67-100%) N/[name] Granulation [name] mount: Pink Pink N/[name] Granulation Quality: Small (1-33%) Small (1-33%) N/[name] Necrotic [name] mount: Fat Layer (Subcutaneous Tissue): Yes Fat Layer (Subcutaneous Tissue): Yes N/[name] Exposed Structures: Fascia: No Fascia: No Tendon: No Tendon: No Muscle: No Muscle: No Joint: No Joint: No Bone: No Bone: No None None N/[name] Epithelialization: Treatment Notes Electronic Signature(s) Signed: 05/27/2023 12:30:42 PM By: Midge Aver MSN RN CNS WTA Entered By: Midge Aver on 05/26/2023 05:25:19 -------------------------------------------------------------------------------- Multi-Disciplinary Care Plan Details Patient Name: Date  of Service: Ryan [name]. 05/26/2023 7:45 [name] Medical Record Number: [id number] Patient Account Number: [id number] Date of Birth/Sex: Treating RN: March 09, 1971 (52 y.o. Ryan [name] Primary Care Buzz Axel: [name] Other Clinician: Referring Jamarquis Crull: Treating Kaisha Wachob/Extender: [name] in Treatment: 5 Active Inactive Necrotic Tissue Nursing Diagnoses: Knowledge deficit related to management of necrotic/devitalized tissue Goals: Patient/caregiver will verbalize understanding of reason and process for debridement of necrotic tissue Date Initiated: 04/18/2023 Target Resolution Date: 06/18/2023 Goal Status: Active KAMARII, CARTON [name] ([id number]) [id number] Page 6 of 11 Interventions: Assess patient pain level pre-, during and post procedure and prior to discharge Notes: Wound/Skin Impairment Nursing Diagnoses: Knowledge deficit related to ulceration/compromised skin integrity Goals: Patient/caregiver will verbalize understanding of skin care regimen Date Initiated: 04/18/2023 Date Inactivated: 05/26/2023 Target Resolution Date: 05/19/2023 Goal Status: Met Ulcer/skin breakdown will have [name] volume reduction of 30% by week 4 Date Initiated: 04/18/2023 Date Inactivated: 05/26/2023 Target Resolution Date: 05/19/2023 Goal Status: Met Ulcer/skin breakdown will have [name] volume reduction of 50% by week 8 Date Initiated: 04/18/2023 Target Resolution Date: 06/18/2023 Goal Status: Active Ulcer/skin breakdown will have [name] volume reduction of 80% by week 12 Date Initiated: 04/18/2023 Target Resolution Date: 07/19/2023 Goal Status: Active Ulcer/skin breakdown will heal within 14 weeks Date Initiated: 04/18/2023 Target Resolution Date: 08/18/2023 Goal Status: Active Interventions: Assess patient/caregiver ability to obtain necessary supplies Assess patient/caregiver ability to perform ulcer/skin care regimen upon admission and as  needed Assess ulceration(s) every visit Notes: Electronic Signature(s) Signed: 05/27/2023 12:30:42 PM By: Midge Aver MSN RN CNS WTA Entered By: Midge Aver on 05/26/2023 05:31:17 -------------------------------------------------------------------------------- Pain Assessment Details Patient Name: Date of Service: Ryan [name]. 05/26/2023 7:45 [name] Medical Record Number: [id number] Patient Account Number: [id number] Date of Birth/Sex: Treating RN: July 19, 1971 (52 y.o. Ryan [name] Primary Care Glenda Kunst: [name] Other Clinician: Referring Lailyn Appelbaum: Treating Breanna Mcdaniel/Extender: [name] in Treatment: 5 Active Problems Location of Pain Severity and Description of Pain Patient Has Paino No Site Locations Ryan [name], Ryan [name] ([id number]) [phone].pdf Page 7 of 11 Pain Management and Medication Current Pain Management: Electronic Signature(s) Signed: 05/26/2023 9:09:47 AM By: Midge Aver MSN RN CNS WTA Entered By: Midge Aver on 05/26/2023 06:09:47 -------------------------------------------------------------------------------- Patient/Caregiver Education Details Patient Name: Date of Service: Ryan Jaeger [name]. 9/26/2024andnbsp7:45 [name] Medical Record Number: [id number] Patient Account Number: [id number] Date of Birth/Gender: Treating RN: December 11, 1970 (52 y.o. Ryan [name] Primary Care Physician: [name] Other Clinician: Referring Physician: Treating Physician/Extender: [name] in Treatment: 5 Education Assessment Education Provided To: Patient Education Topics Provided Wound/Skin Impairment: Handouts: Caring for Your Ulcer Methods: Explain/Verbal Responses: State content correctly Electronic Signature(s) Signed: 05/27/2023 12:30:42 PM By: Midge Aver MSN RN CNS WTA Entered By:  N/[name] % Reduction in [name] rea: 54.00% 86.10% N/[name] % Reduction in Volume: Grade 2 Grade 1 N/[name] Classification: Medium Medium N/[name] Exudate [name] mount: Serosanguineous Serosanguineous N/[name] Exudate Type: red, brown red, brown N/[name] Exudate Color: Large (67-100%) Large (67-100%) N/[name] Granulation [name] mount: Pink Pink N/[name] Granulation Quality: Small (1-33%) Small (1-33%) N/[name] Necrotic [name] mount: Fat Layer (Subcutaneous Tissue): Yes Fat Layer (Subcutaneous Tissue): Yes N/[name] Exposed Structures: Fascia: No Fascia: No Tendon: No Tendon: No Muscle: No Muscle: No Joint: No Joint: No Bone: No Bone: No None None N/[name] Epithelialization: Treatment Notes Electronic Signature(s) Signed: 05/27/2023 12:30:42 PM By: Midge Aver MSN RN CNS WTA Entered By: Midge Aver on 05/26/2023 05:25:19 -------------------------------------------------------------------------------- Multi-Disciplinary Care Plan Details Patient Name: Date  of Service: Ryan [name]. 05/26/2023 7:45 [name] Medical Record Number: [id number] Patient Account Number: [id number] Date of Birth/Sex: Treating RN: March 09, 1971 (52 y.o. Ryan [name] Primary Care Buzz Axel: [name] Other Clinician: Referring Jamarquis Crull: Treating Kaisha Wachob/Extender: [name] in Treatment: 5 Active Inactive Necrotic Tissue Nursing Diagnoses: Knowledge deficit related to management of necrotic/devitalized tissue Goals: Patient/caregiver will verbalize understanding of reason and process for debridement of necrotic tissue Date Initiated: 04/18/2023 Target Resolution Date: 06/18/2023 Goal Status: Active KAMARII, CARTON [name] ([id number]) [id number] Page 6 of 11 Interventions: Assess patient pain level pre-, during and post procedure and prior to discharge Notes: Wound/Skin Impairment Nursing Diagnoses: Knowledge deficit related to ulceration/compromised skin integrity Goals: Patient/caregiver will verbalize understanding of skin care regimen Date Initiated: 04/18/2023 Date Inactivated: 05/26/2023 Target Resolution Date: 05/19/2023 Goal Status: Met Ulcer/skin breakdown will have [name] volume reduction of 30% by week 4 Date Initiated: 04/18/2023 Date Inactivated: 05/26/2023 Target Resolution Date: 05/19/2023 Goal Status: Met Ulcer/skin breakdown will have [name] volume reduction of 50% by week 8 Date Initiated: 04/18/2023 Target Resolution Date: 06/18/2023 Goal Status: Active Ulcer/skin breakdown will have [name] volume reduction of 80% by week 12 Date Initiated: 04/18/2023 Target Resolution Date: 07/19/2023 Goal Status: Active Ulcer/skin breakdown will heal within 14 weeks Date Initiated: 04/18/2023 Target Resolution Date: 08/18/2023 Goal Status: Active Interventions: Assess patient/caregiver ability to obtain necessary supplies Assess patient/caregiver ability to perform ulcer/skin care regimen upon admission and as  needed Assess ulceration(s) every visit Notes: Electronic Signature(s) Signed: 05/27/2023 12:30:42 PM By: Midge Aver MSN RN CNS WTA Entered By: Midge Aver on 05/26/2023 05:31:17 -------------------------------------------------------------------------------- Pain Assessment Details Patient Name: Date of Service: Ryan [name]. 05/26/2023 7:45 [name] Medical Record Number: [id number] Patient Account Number: [id number] Date of Birth/Sex: Treating RN: July 19, 1971 (52 y.o. Ryan [name] Primary Care Glenda Kunst: [name] Other Clinician: Referring Lailyn Appelbaum: Treating Breanna Mcdaniel/Extender: [name] in Treatment: 5 Active Problems Location of Pain Severity and Description of Pain Patient Has Paino No Site Locations Ryan [name], Ryan [name] ([id number]) [phone].pdf Page 7 of 11 Pain Management and Medication Current Pain Management: Electronic Signature(s) Signed: 05/26/2023 9:09:47 AM By: Midge Aver MSN RN CNS WTA Entered By: Midge Aver on 05/26/2023 06:09:47 -------------------------------------------------------------------------------- Patient/Caregiver Education Details Patient Name: Date of Service: Ryan Jaeger [name]. 9/26/2024andnbsp7:45 [name] Medical Record Number: [id number] Patient Account Number: [id number] Date of Birth/Gender: Treating RN: December 11, 1970 (52 y.o. Ryan [name] Primary Care Physician: [name] Other Clinician: Referring Physician: Treating Physician/Extender: [name] in Treatment: 5 Education Assessment Education Provided To: Patient Education Topics Provided Wound/Skin Impairment: Handouts: Caring for Your Ulcer Methods: Explain/Verbal Responses: State content correctly Electronic Signature(s) Signed: 05/27/2023 12:30:42 PM By: Midge Aver MSN RN CNS WTA Entered By:  soap, rinse and pat dry prior to dressing wounds Wound Cleanser Discharge Instruction: Wash your hands with soap and water. Remove old dressing, discard into plastic bag and place into trash. Cleanse the wound with Wound Cleanser prior to applying [name] clean dressing using gauze sponges, not tissues or cotton balls. Do not scrub or use excessive force. Pat dry using gauze sponges, not tissue or cotton balls. Peri-Wound Care Topical Primary Dressing Hydrofera Blue Ready Transfer Foam, 2.5x2.5 (in/in) Discharge Instruction: Apply Hydrofera Blue Ready to wound bed as directed Secondary Dressing ABD Pad 5x9 (in/in) Discharge Instruction: Cover with ABD pad Secured With Compression Wrap Urgo K2, two layer compression system, large Compression Stockings Add-Ons Electronic Signature(s) Signed: 05/27/2023 12:30:42 PM By: Midge Aver MSN RN CNS WTA Entered By: Midge Aver on 05/26/2023 06:10:57 -------------------------------------------------------------------------------- Vitals Details Patient Name: Date of Service: Ryan [name]. 05/26/2023 7:45 [name] Medical Record Number: [id number] Patient Account Number: [id number] Date of Birth/Sex: Treating RN: 1971-04-24 (52 y.o. Ryan [name] Primary Care Korde Jeppsen: [name] Other Clinician: JERIMY, Ryan [name] ([id number]) [id number] Page 11 of 11 Referring Adasha Boehme: Treating Elmor Kost/Extender:  [name] in Treatment: 5 Vital Signs Time Taken: 07:59 Temperature (F): 97.6 Height (in): 67 Pulse (bpm): 90 Weight (lbs): 338 Respiratory Rate (breaths/min): 18 Body Mass Index (BMI): 52.9 Blood Pressure (mmHg): 130/84 Reference Range: 80 - 120 mg / dl Electronic Signature(s) Signed: 05/26/2023 9:09:43 AM By: Midge Aver MSN RN CNS WTA Entered By: Midge Aver on 05/26/2023 06:09:43

## 2023-05-30 ENCOUNTER — Encounter: Payer: Self-pay | Admitting: Physician Assistant

## 2023-05-31 ENCOUNTER — Encounter: Payer: Self-pay | Admitting: Pulmonary Disease

## 2023-05-31 ENCOUNTER — Ambulatory Visit (INDEPENDENT_AMBULATORY_CARE_PROVIDER_SITE_OTHER): Payer: No Typology Code available for payment source | Admitting: Pulmonary Disease

## 2023-05-31 ENCOUNTER — Institutional Professional Consult (permissible substitution): Payer: No Typology Code available for payment source | Admitting: Pulmonary Disease

## 2023-05-31 ENCOUNTER — Encounter: Payer: No Typology Code available for payment source | Attending: Physician Assistant | Admitting: Physician Assistant

## 2023-05-31 VITALS — BP 106/60 | HR 54 | Temp 97.9°F | Ht 67.0 in | Wt 343.2 lb

## 2023-05-31 DIAGNOSIS — I5042 Chronic combined systolic (congestive) and diastolic (congestive) heart failure: Secondary | ICD-10-CM | POA: Diagnosis not present

## 2023-05-31 DIAGNOSIS — L97822 Non-pressure chronic ulcer of other part of left lower leg with fat layer exposed: Secondary | ICD-10-CM | POA: Insufficient documentation

## 2023-05-31 DIAGNOSIS — R0602 Shortness of breath: Secondary | ICD-10-CM | POA: Diagnosis not present

## 2023-05-31 DIAGNOSIS — E11621 Type 2 diabetes mellitus with foot ulcer: Secondary | ICD-10-CM | POA: Diagnosis not present

## 2023-05-31 DIAGNOSIS — I87332 Chronic venous hypertension (idiopathic) with ulcer and inflammation of left lower extremity: Secondary | ICD-10-CM | POA: Insufficient documentation

## 2023-05-31 DIAGNOSIS — Z86711 Personal history of pulmonary embolism: Secondary | ICD-10-CM | POA: Diagnosis not present

## 2023-05-31 DIAGNOSIS — N183 Chronic kidney disease, stage 3 unspecified: Secondary | ICD-10-CM | POA: Insufficient documentation

## 2023-05-31 DIAGNOSIS — I2782 Chronic pulmonary embolism: Secondary | ICD-10-CM | POA: Insufficient documentation

## 2023-05-31 DIAGNOSIS — I890 Lymphedema, not elsewhere classified: Secondary | ICD-10-CM | POA: Insufficient documentation

## 2023-05-31 DIAGNOSIS — E1151 Type 2 diabetes mellitus with diabetic peripheral angiopathy without gangrene: Secondary | ICD-10-CM | POA: Insufficient documentation

## 2023-05-31 DIAGNOSIS — I130 Hypertensive heart and chronic kidney disease with heart failure and stage 1 through stage 4 chronic kidney disease, or unspecified chronic kidney disease: Secondary | ICD-10-CM | POA: Diagnosis not present

## 2023-05-31 DIAGNOSIS — E11622 Type 2 diabetes mellitus with other skin ulcer: Secondary | ICD-10-CM | POA: Insufficient documentation

## 2023-05-31 DIAGNOSIS — L97522 Non-pressure chronic ulcer of other part of left foot with fat layer exposed: Secondary | ICD-10-CM | POA: Diagnosis not present

## 2023-05-31 NOTE — Progress Notes (Signed)
Synopsis: Referred in by [name], NP   Subjective:   PATIENT ID: Ryan [name] GENDER: male DOB: Jan 03, 1971, MRN: [id number]  Chief Complaint  Patient presents with   Consult    Shortness of breath on exertion and at rest. Hx of PE. Pt was seeing Dr. Meredeth Ide.     HPI Ryan [name] is a 51 year old male patient with a past medical history of recurrent DVT/PE presenting to the pulmonary clinic today to establish care.   He was first diagnosed with a lower extremity DVT at the age of 74 which was with Coumadin.  He had extensive workup at [facility] hematology clinic with no clear hypercoagulable state.  He has had multiple IVC filters and reports that one of them has been there for 10 years and has not been removed.  He was tried on [employer] which have failed and now settled on Coumadin.  More recently, in February 2023 he ran out of Xarelto and was found to have recurrent pulmonary embolism with severe right heart strain at [facility].  In May 2023 CT chest angiogram showed right pulmonary artery branch chronic PE but no acute PE.  Ultrasound of the lower extremity demonstrated nonocclusive thrombus in both legs.  He was continued on Xarelto.  Fast forward to April 2024 he presented to the emergency department with increased work of breathing and a cough and was found to have acute on chronic PE with right main pulmonary thrombus superimposed on bulky chronic clot which is seen adherent to the vessel wall.  He underwent mechanical thrombectomy and was switched from Xarelto to Coumadin for an INR goal 2-3.  Unfortunately he returns in September 2024 for shortness of breath, tachycardia and decreased oxygen saturation.  CTA was done then which redemonstrated a large clot in the main right pulmonary artery with extension of the clot into the right upper lobe lobar pulmonary artery.  There is interval increase in the peripheral nonocclusive thrombus in the left lung lower lobe.  CTA chest showed right  heart strain.  He underwent mechanical thrombectomy again on 09/17.  He was discharged on Coumadin.  Echocardiogram on 09/17 prior to thrombectomy showed an LVEF of 60 to 65%.  Moderately reduced right ventricular systolic function and a moderately enlarged right ventricular size.  There was tricuspid regurgitation but could not be quantified.  He presents today to the pulmonary clinic for second opinion and to establish care.  He reports that he still has some shortness of breath on exertion with episode of desaturation.  He denies any chest pain palpitations cough or sputum production.  He denies any fever or chills.   Family history: Denies any family history of pulmonary disease   Social history:  Never smoker   ROS All systems have been reviewed and are negative except for the above. Objective:   Vitals:   05/31/23 1047  BP: 106/60  Pulse: (!) 54  Temp: 97.9 F (36.6 C)  TempSrc: Temporal  SpO2: 91%  Weight: (!) 343 lb 3.2 oz (155.7 kg)  Height: 5\' 7"  (1.702 m)   91% on RA BMI Readings from Last 3 Encounters:  05/31/23 53.75 kg/m  05/16/23 50.90 kg/m  05/03/23 52.84 kg/m   Wt Readings from Last 3 Encounters:  05/31/23 (!) 343 lb 3.2 oz (155.7 kg)  05/16/23 (!) 325 lb (147.4 kg)  05/03/23 (!) 337 lb 6.4 oz (153 kg)    Physical Exam GEN: NAD, Morbidly Obese HEENT: Supple Neck, Reactive Pupils, EOMI  CVS: Normal S1, Normal S2, RRR, No murmurs or ES appreciated  Lungs: Clear bilateral air entry.  Abdomen: Soft, non tender, non distended, + BS  Extremities: Warm and well perfused, No edema  Skin: No suspicious lesions appreciated  Psych: Normal Affect  Ancillary Information   CBC    Component Value Date/Time   WBC 3.9 (L) 05/24/2023 0704   RBC 4.75 05/24/2023 0704   HGB 14.4 05/24/2023 0704   HGB 15.8 12/23/2022 0953   HCT 43.4 05/24/2023 0704   HCT 47.3 12/23/2022 0953   PLT 340 05/24/2023 0704   PLT 297 12/23/2022 0953   MCV 91.4 05/24/2023 0704   MCV  92 12/23/2022 0953   MCV 90 12/28/2014 0444   MCH 30.3 05/24/2023 0704   MCHC 33.2 05/24/2023 0704   RDW 16.2 (H) 05/24/2023 0704   RDW 13.2 12/23/2022 0953   RDW 14.0 12/28/2014 0444   LYMPHSABS 1.5 01/19/2023 1043   LYMPHSABS 1.4 12/23/2022 0953   LYMPHSABS 1.3 12/28/2014 0444   MONOABS 0.5 01/19/2023 1043   MONOABS 2.1 (H) 12/28/2014 0444   EOSABS 0.1 01/19/2023 1043   EOSABS 0.1 12/23/2022 0953   EOSABS 0.1 12/28/2014 0444   BASOSABS 0.0 01/19/2023 1043   BASOSABS 0.0 12/23/2022 0953   BASOSABS 0.1 12/28/2014 0444    Imaging  CTA chest 05/15/2024 1. Redemonstration of large clot in the main right pulmonary artery, essentially similar to the prior study. There is extension of the clot into the right upper lobe lobar pulmonary artery, that is also unchanged. However, there is interval increase in the peripheral nonocclusive thrombus in the left lung lower lobe lobar pulmonary artery when compared to the prior exam. 2. Redemonstration of compressed left ventricle, compatible with right heart failure/strain. 3. Dilation of the main pulmonary trunk measuring up to 3.7 cm, which is nonspecific but can be seen with pulmonary artery hypertension. 4. Multiple other nonacute observations, as described above.   Echocardiogram 05/17/2023  1. Left ventricular ejection fraction, by estimation, is 60 to 65%. The  left ventricle has normal function. The left ventricle has no regional  wall motion abnormalities. There is mild asymmetric left ventricular  hypertrophy of the basal-septal segment.  Left ventricular diastolic parameters were normal. There is the  interventricular septum is flattened in systole, consistent with right  ventricular pressure overload.   2. Right ventricular systolic function is moderately reduced. The right  ventricular size is moderately enlarged. Tricuspid regurgitation signal is  inadequate for assessing PA pressure.   3. Right atrial size was moderately  dilated.   4. The mitral valve is normal in structure. No evidence of mitral valve  regurgitation. No evidence of mitral stenosis.   5. The aortic valve is calcified. Aortic valve regurgitation is trivial.  Aortic valve sclerosis/calcification is present, without any evidence of  aortic stenosis.   Perfusion Scan 02/2023 High probability of pulmonary embolism bilaterally. The acuity of this finding is indeterminate given bilateral pulmonary emboli on previous CT angiogram from 12/13/2022. A follow-up CTA could be performed to assess for progression and to assess for the presence of right heart strain.       No data to display           Assessment & Plan:  Ryan [name] is a 52 year old male patient with a past medical history of recurrent DVT/PE presenting to the pulmonary clinic today to establish care.   #History of multiple DVTs s/p IVC filter placement (Remains in place for >  10 years)  #History of recurrent PE most recent on 09/16 has been on Xarelto up until April and was switched to Coumadin back then. Has been therapeutic and despite that he had a recurrent PE. Hypercoagulable work up has been negative thus far.  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 Managed by PCP/Hematology  []  Refer to [facility] CTEPH clinic for further evaluation and management. Might require surgical thrombectomy and possibly [place] for management of his PH.   #High Risk OSA  #[facility]  STOP BANG 8 high risk for sleep apnea. With snoring, fatigue during the day, increased neck circumference and obesity.   []  Split night.   Return in about 3 months (around 08/31/2023).  I spent 60 minutes caring for this patient today, including preparing to see the patient, obtaining a medical history , reviewing a separately obtained history, performing a  medically appropriate examination and/or evaluation, counseling and educating the patient/family/caregiver, ordering medications, tests, or procedures, referring and communicating with other health care professionals (not separately reported), documenting clinical information in the electronic health record, and independently interpreting results (not separately reported/billed) and communicating results to the patient/family/caregiver  Janann Colonel, MD [facility] 05/31/2023 11:56 AM

## 2023-05-31 NOTE — Progress Notes (Addendum)
appears to be doing well currently in regard to his wound on the leg. He and both locations seems to be showing signs of improvement  which is great news and in general I do believe there were making excellent headway towards complete closure which is great news. I do not see any signs of active infection at this time which is also excellent news. 05-16-2023 upon evaluation today patient appears to be doing well currently with regard to his wound we did actually have a check at this today but that is not the biggest thing I am concerned about at this point. He unfortunately is having some issues here with oxygen saturation dropping to around 84% times and then bouncing back up and down he is breathing rapidly he is also fluctuating with regard to pulse anywhere from 88 that I got manually to it at some points even up at 110 his body does seem to be compensating. He tells me that he sleeps with oxygen but does not wear it during the day typically he has been short of breath for a little under a week. Shortly after he was here last week. 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 He also tells me that he has been having some issues catching his breath with ambulation as well which is also a big part of the issue at this point. Fortunately I do not see any signs of obvious infection and is listening to him I could not tell any abnormal breath sounds although he may be a little bit restricted although I cannot be for sure. He tells me that the only thing he may have had a little bit of chills but nothing else significant. Nonetheless I think he needs to be evaluated at the ER ASAP. 05-26-2023 upon evaluation today patient appears to be doing well currently in regard to his wounds which are actually significantly better. 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 He is now on additional blood thinners and states that he is in a good place as far as the blood  thinners are concerned. He is feeling much better as well able to breathe quite nicely. I am very pleased with where things stand currently and do not see any signs of infection at this time which is also good news. 05-31-2023 upon evaluation today patient appears to be doing well with regard to his wound one of them is healed the other is getting close. Unfortunately he is having issues again with his breathing today oxygen saturation around 87 to 90% in general during the evaluation at this point. This has me concerned again as he had previous pulmonary emboli does have an appointment with pulmonology tomorrow but I do not think he needs to wait till tomorrow. Electronic Signature(s) Signed: 05/31/2023 10:26:08 AM By: [name] PA-C Entered By: [name] on 05/31/2023 07:26:07 -------------------------------------------------------------------------------- Physical Exam Details Patient Name: Date of Service: Ryan Elm RLES A. 05/31/2023 8:00 A M Medical Record Number: [id number] Patient Account Number: [id number] Date of Birth/Sex: Treating RN: 01/12/1971 (52 y.o. Ryan [name] Primary Care Provider: [name] Other Clinician: Referring Provider: Treating Provider/Extender: [name] in Treatment: 6 Constitutional Obese and well-hydrated in no acute distress. Respiratory labored, rapid respiration. Psychiatric this patient is able to make  Hygiene: May shower with wound dressing protected with water repellent cover or cast protector. Anesthetic (Use 'Patient Medications' Section for Anesthetic Order Entry): Lidocaine applied to wound bed Edema Control - Lymphedema / Segmental Compressive Device / Other: UrgoK2 - Large left leg Tubigrip double layer applied - FF right leg Elevate, Exercise Daily and Avoid Standing for Long Periods of Time. Elevate legs to the level of the heart and pump ankles as often as possible Elevate leg(s) parallel to the floor when sitting. WOUND #1: - Lower Leg Wound Laterality: Left, Medial Cleanser: Soap and Water 1 x Per Week/30 Days Discharge Instructions: Gently cleanse wound with antibacterial soap, rinse and pat dry prior to dressing wounds Cleanser: Wound Cleanser 1 x Per Week/30 Days Discharge Instructions: Wash your hands with soap and water. Remove old dressing, discard into plastic bag and place into trash. Cleanse the wound with Wound Cleanser prior to applying a clean  dressing using gauze sponges, not tissues or cotton balls. Do not scrub or use excessive force. Pat dry using gauze sponges, not tissue or cotton balls. Prim Dressing: Hydrofera Blue Ready Transfer Foam, 2.5x2.5 (in/in) 1 x Per Week/30 Days ary Discharge Instructions: Apply Hydrofera Blue Ready to wound bed as directed Secondary Dressing: ABD Pad 5x9 (in/in) 1 x Per Week/30 Days Discharge Instructions: Cover with ABD pad Com pression Wrap: Urgo K2, two layer compression system, large 1 x Per Week/30 Days 1. I am going to recommend at this point that we have the patient continue to monitor for any signs of worsening or infection I would recommend currently that we go forward with the [facility] which I think is a good option here. 2. I would recommend ABD pad to cover followed by the Urgo K2 compression wrap. 3. I am also going to recommend based on what I am seeing that the patient should go to pulmonology today he has an appointment at 11:30 AM we will get a good courtesy card to take him over there so he does not have to drive he will wait in the office until they are able to see him. 4. I am also going to recommend that he should if anything gets worse in the meantime call 911 to get him to the ER he is in agreement with that plan. We will see patient back for reevaluation in 1 week here in the clinic. If anything worsens or changes patient will contact our office for additional recommendations. Electronic Signature(s) Signed: 05/31/2023 10:28:18 AM By: [name] PA-C Entered By: [name] on 05/31/2023 07:28:18 -------------------------------------------------------------------------------- SuperBill Details Patient Name: Date of Service: Ryan Elm RLES A. 05/31/2023 Medical Record Number: [id number] Patient Account Number: [id number] Date of Birth/Sex: Treating RN: [birth year] (52 y.o. Ryan [name]) Yevonne Pax Primary Care Provider: [name] Other Clinician: Referring  Provider: Treating Provider/Extender: [name] in Treatment: 6 Diagnosis Coding ICD-10 Codes Code Description [phone] Type 2 diabetes mellitus with other skin ulcer L97.822 Non-pressure chronic ulcer of other part of left lower leg with fat layer exposed E11.621 Type 2 diabetes mellitus with foot ulcer L97.522 Non-pressure chronic ulcer of other part of left foot with fat layer exposed I50.42 Chronic combined systolic (congestive) and diastolic (congestive) heart failure I89.0 Lymphedema, not elsewhere classified 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 I10 Essential (primary) hypertension I73.89 Other specified peripheral vascular diseases N18.30 Chronic kidney disease, stage 3 unspecified Horsman, Tyrian A ([id number]) [id number] Page 8 of 8 Facility Procedures :  JACQUEL, MCCAMISH A ([id number]) [id number] Page 1 of 8 Visit Report for 05/31/2023 Chief Complaint Document Details Patient Name: Date of Service: NOLYN, SWAB RLES A. 05/31/2023 8:00 A M Medical Record Number: [id number] Patient Account Number: [id number] Date of Birth/Sex: Treating RN: [birth year] (52 y.o. Ryan [name]) Yevonne Pax Primary Care Provider: [name] Other Clinician: Referring Provider: Treating Provider/Extender: [name] in Treatment: 6 Information Obtained from: Patient Chief Complaint Left leg and left foot ulcers Electronic Signature(s) Signed: 05/31/2023 8:14:40 AM By: [name] PA-C Entered By: [name] on 05/31/2023 05:14:40 -------------------------------------------------------------------------------- HPI Details Patient Name: Date of Service: Ryan Elm RLES A. 05/31/2023 8:00 A M Medical Record Number: [id number] Patient Account Number: [id number] Date of Birth/Sex: Treating RN: [birth year] (52 y.o. Ryan [name] Primary Care Provider: [name] Other Clinician: Referring Provider: Treating Provider/Extender: [name] in Treatment: 6 History of Present Illness Chronic/Inactive Conditions Condition 1: 04-18-2023 patient screening ABI today was 0.83 and this was on the left. With that being said he had previous ABIs done formally on 06-25-2021 with a right ABI of 1.12 and a TBI of 1.03 on the left she had an ABI of 1.12 with a TBI of 0.98. HPI Description: 04-18-2023 upon evaluation today patient presents for initial inspection here in our clinic some concerning issues he is having with his left proximal foot as well as his left lower extremity. This actually appears to be more of a lymphedema type situation to be honest. He does have chronic lower extremity swelling secondary to what appears to be venous stasis he has not really had any wounds significantly before and tells me  that it has been a number of years probably even 12 or so since he has last used his lymphedema pumps. He is not even sure if they are actually functioning anymore or not. With that being said this is 1 thing that I did want him to look into. He is on a fluid pill and he does not wear compression socks on a regular basis although I definitely think that something that he also really needs to be doing to be honest. He does not have any injury that occurred to cause these wounds they more or less spontaneously occurred as a result of what appears to be excessive swelling. Patient does have a history of diabetes mellitus type 2, congestive heart failure, lymphedema, venous hypertension, hypertension, peripheral vascular disease, and chronic kidney disease stage III. 8/26; this is a patient with chronic lymphedema. He has wounds on the left medial lower extremity and left medial foot. We have been using Hydrofera Blue and Urgo K2. He tells me he has a compression pump at home although it is missing apart and he is not really familiar with with which company to call. He also started on furosemide 20 mg every other day probably would do better with every day and he is talking to his physician about this. GRAIG, HESSLING A ([id number]) [id number] Page 2 of 8 05-05-2023 upon evaluation today patient appears to be doing pretty well currently in regard to his wound. Fortunately there does not appear to be any signs of infection he does have some slough and biofilm noted I did discuss with the patient today I do believe that he would benefit from a continuation of therapy with regard to his wound. I think we may need to increase the compression however and also think that he may benefit from targeted debridement today. 05-09-2023 upon evaluation today patient  JACQUEL, MCCAMISH A ([id number]) [id number] Page 1 of 8 Visit Report for 05/31/2023 Chief Complaint Document Details Patient Name: Date of Service: NOLYN, SWAB RLES A. 05/31/2023 8:00 A M Medical Record Number: [id number] Patient Account Number: [id number] Date of Birth/Sex: Treating RN: [birth year] (52 y.o. Ryan [name]) Yevonne Pax Primary Care Provider: [name] Other Clinician: Referring Provider: Treating Provider/Extender: [name] in Treatment: 6 Information Obtained from: Patient Chief Complaint Left leg and left foot ulcers Electronic Signature(s) Signed: 05/31/2023 8:14:40 AM By: [name] PA-C Entered By: [name] on 05/31/2023 05:14:40 -------------------------------------------------------------------------------- HPI Details Patient Name: Date of Service: Ryan Elm RLES A. 05/31/2023 8:00 A M Medical Record Number: [id number] Patient Account Number: [id number] Date of Birth/Sex: Treating RN: [birth year] (52 y.o. Ryan [name] Primary Care Provider: [name] Other Clinician: Referring Provider: Treating Provider/Extender: [name] in Treatment: 6 History of Present Illness Chronic/Inactive Conditions Condition 1: 04-18-2023 patient screening ABI today was 0.83 and this was on the left. With that being said he had previous ABIs done formally on 06-25-2021 with a right ABI of 1.12 and a TBI of 1.03 on the left she had an ABI of 1.12 with a TBI of 0.98. HPI Description: 04-18-2023 upon evaluation today patient presents for initial inspection here in our clinic some concerning issues he is having with his left proximal foot as well as his left lower extremity. This actually appears to be more of a lymphedema type situation to be honest. He does have chronic lower extremity swelling secondary to what appears to be venous stasis he has not really had any wounds significantly before and tells me  that it has been a number of years probably even 12 or so since he has last used his lymphedema pumps. He is not even sure if they are actually functioning anymore or not. With that being said this is 1 thing that I did want him to look into. He is on a fluid pill and he does not wear compression socks on a regular basis although I definitely think that something that he also really needs to be doing to be honest. He does not have any injury that occurred to cause these wounds they more or less spontaneously occurred as a result of what appears to be excessive swelling. Patient does have a history of diabetes mellitus type 2, congestive heart failure, lymphedema, venous hypertension, hypertension, peripheral vascular disease, and chronic kidney disease stage III. 8/26; this is a patient with chronic lymphedema. He has wounds on the left medial lower extremity and left medial foot. We have been using Hydrofera Blue and Urgo K2. He tells me he has a compression pump at home although it is missing apart and he is not really familiar with with which company to call. He also started on furosemide 20 mg every other day probably would do better with every day and he is talking to his physician about this. GRAIG, HESSLING A ([id number]) [id number] Page 2 of 8 05-05-2023 upon evaluation today patient appears to be doing pretty well currently in regard to his wound. Fortunately there does not appear to be any signs of infection he does have some slough and biofilm noted I did discuss with the patient today I do believe that he would benefit from a continuation of therapy with regard to his wound. I think we may need to increase the compression however and also think that he may benefit from targeted debridement today. 05-09-2023 upon evaluation today patient  Hygiene: May shower with wound dressing protected with water repellent cover or cast protector. Anesthetic (Use 'Patient Medications' Section for Anesthetic Order Entry): Lidocaine applied to wound bed Edema Control - Lymphedema / Segmental Compressive Device / Other: UrgoK2 - Large left leg Tubigrip double layer applied - FF right leg Elevate, Exercise Daily and Avoid Standing for Long Periods of Time. Elevate legs to the level of the heart and pump ankles as often as possible Elevate leg(s) parallel to the floor when sitting. WOUND #1: - Lower Leg Wound Laterality: Left, Medial Cleanser: Soap and Water 1 x Per Week/30 Days Discharge Instructions: Gently cleanse wound with antibacterial soap, rinse and pat dry prior to dressing wounds Cleanser: Wound Cleanser 1 x Per Week/30 Days Discharge Instructions: Wash your hands with soap and water. Remove old dressing, discard into plastic bag and place into trash. Cleanse the wound with Wound Cleanser prior to applying a clean  dressing using gauze sponges, not tissues or cotton balls. Do not scrub or use excessive force. Pat dry using gauze sponges, not tissue or cotton balls. Prim Dressing: Hydrofera Blue Ready Transfer Foam, 2.5x2.5 (in/in) 1 x Per Week/30 Days ary Discharge Instructions: Apply Hydrofera Blue Ready to wound bed as directed Secondary Dressing: ABD Pad 5x9 (in/in) 1 x Per Week/30 Days Discharge Instructions: Cover with ABD pad Com pression Wrap: Urgo K2, two layer compression system, large 1 x Per Week/30 Days 1. I am going to recommend at this point that we have the patient continue to monitor for any signs of worsening or infection I would recommend currently that we go forward with the [facility] which I think is a good option here. 2. I would recommend ABD pad to cover followed by the Urgo K2 compression wrap. 3. I am also going to recommend based on what I am seeing that the patient should go to pulmonology today he has an appointment at 11:30 AM we will get a good courtesy card to take him over there so he does not have to drive he will wait in the office until they are able to see him. 4. I am also going to recommend that he should if anything gets worse in the meantime call 911 to get him to the ER he is in agreement with that plan. We will see patient back for reevaluation in 1 week here in the clinic. If anything worsens or changes patient will contact our office for additional recommendations. Electronic Signature(s) Signed: 05/31/2023 10:28:18 AM By: [name] PA-C Entered By: [name] on 05/31/2023 07:28:18 -------------------------------------------------------------------------------- SuperBill Details Patient Name: Date of Service: Ryan Elm RLES A. 05/31/2023 Medical Record Number: [id number] Patient Account Number: [id number] Date of Birth/Sex: Treating RN: [birth year] (52 y.o. Ryan [name]) Yevonne Pax Primary Care Provider: [name] Other Clinician: Referring  Provider: Treating Provider/Extender: [name] in Treatment: 6 Diagnosis Coding ICD-10 Codes Code Description [phone] Type 2 diabetes mellitus with other skin ulcer L97.822 Non-pressure chronic ulcer of other part of left lower leg with fat layer exposed E11.621 Type 2 diabetes mellitus with foot ulcer L97.522 Non-pressure chronic ulcer of other part of left foot with fat layer exposed I50.42 Chronic combined systolic (congestive) and diastolic (congestive) heart failure I89.0 Lymphedema, not elsewhere classified 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 I10 Essential (primary) hypertension I73.89 Other specified peripheral vascular diseases N18.30 Chronic kidney disease, stage 3 unspecified Horsman, Tyrian A ([id number]) [id number] Page 8 of 8 Facility Procedures :  Hygiene: May shower with wound dressing protected with water repellent cover or cast protector. Anesthetic (Use 'Patient Medications' Section for Anesthetic Order Entry): Lidocaine applied to wound bed Edema Control - Lymphedema / Segmental Compressive Device / Other: UrgoK2 - Large left leg Tubigrip double layer applied - FF right leg Elevate, Exercise Daily and Avoid Standing for Long Periods of Time. Elevate legs to the level of the heart and pump ankles as often as possible Elevate leg(s) parallel to the floor when sitting. WOUND #1: - Lower Leg Wound Laterality: Left, Medial Cleanser: Soap and Water 1 x Per Week/30 Days Discharge Instructions: Gently cleanse wound with antibacterial soap, rinse and pat dry prior to dressing wounds Cleanser: Wound Cleanser 1 x Per Week/30 Days Discharge Instructions: Wash your hands with soap and water. Remove old dressing, discard into plastic bag and place into trash. Cleanse the wound with Wound Cleanser prior to applying a clean  dressing using gauze sponges, not tissues or cotton balls. Do not scrub or use excessive force. Pat dry using gauze sponges, not tissue or cotton balls. Prim Dressing: Hydrofera Blue Ready Transfer Foam, 2.5x2.5 (in/in) 1 x Per Week/30 Days ary Discharge Instructions: Apply Hydrofera Blue Ready to wound bed as directed Secondary Dressing: ABD Pad 5x9 (in/in) 1 x Per Week/30 Days Discharge Instructions: Cover with ABD pad Com pression Wrap: Urgo K2, two layer compression system, large 1 x Per Week/30 Days 1. I am going to recommend at this point that we have the patient continue to monitor for any signs of worsening or infection I would recommend currently that we go forward with the [facility] which I think is a good option here. 2. I would recommend ABD pad to cover followed by the Urgo K2 compression wrap. 3. I am also going to recommend based on what I am seeing that the patient should go to pulmonology today he has an appointment at 11:30 AM we will get a good courtesy card to take him over there so he does not have to drive he will wait in the office until they are able to see him. 4. I am also going to recommend that he should if anything gets worse in the meantime call 911 to get him to the ER he is in agreement with that plan. We will see patient back for reevaluation in 1 week here in the clinic. If anything worsens or changes patient will contact our office for additional recommendations. Electronic Signature(s) Signed: 05/31/2023 10:28:18 AM By: [name] PA-C Entered By: [name] on 05/31/2023 07:28:18 -------------------------------------------------------------------------------- SuperBill Details Patient Name: Date of Service: Ryan Elm RLES A. 05/31/2023 Medical Record Number: [id number] Patient Account Number: [id number] Date of Birth/Sex: Treating RN: [birth year] (52 y.o. Ryan [name]) Yevonne Pax Primary Care Provider: [name] Other Clinician: Referring  Provider: Treating Provider/Extender: [name] in Treatment: 6 Diagnosis Coding ICD-10 Codes Code Description [phone] Type 2 diabetes mellitus with other skin ulcer L97.822 Non-pressure chronic ulcer of other part of left lower leg with fat layer exposed E11.621 Type 2 diabetes mellitus with foot ulcer L97.522 Non-pressure chronic ulcer of other part of left foot with fat layer exposed I50.42 Chronic combined systolic (congestive) and diastolic (congestive) heart failure I89.0 Lymphedema, not elsewhere classified 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 I10 Essential (primary) hypertension I73.89 Other specified peripheral vascular diseases N18.30 Chronic kidney disease, stage 3 unspecified Horsman, Tyrian A ([id number]) [id number] Page 8 of 8 Facility Procedures :  appears to be doing well currently in regard to his wound on the leg. He and both locations seems to be showing signs of improvement  which is great news and in general I do believe there were making excellent headway towards complete closure which is great news. I do not see any signs of active infection at this time which is also excellent news. 05-16-2023 upon evaluation today patient appears to be doing well currently with regard to his wound we did actually have a check at this today but that is not the biggest thing I am concerned about at this point. He unfortunately is having some issues here with oxygen saturation dropping to around 84% times and then bouncing back up and down he is breathing rapidly he is also fluctuating with regard to pulse anywhere from 88 that I got manually to it at some points even up at 110 his body does seem to be compensating. He tells me that he sleeps with oxygen but does not wear it during the day typically he has been short of breath for a little under a week. Shortly after he was here last week. 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 He also tells me that he has been having some issues catching his breath with ambulation as well which is also a big part of the issue at this point. Fortunately I do not see any signs of obvious infection and is listening to him I could not tell any abnormal breath sounds although he may be a little bit restricted although I cannot be for sure. He tells me that the only thing he may have had a little bit of chills but nothing else significant. Nonetheless I think he needs to be evaluated at the ER ASAP. 05-26-2023 upon evaluation today patient appears to be doing well currently in regard to his wounds which are actually significantly better. 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 He is now on additional blood thinners and states that he is in a good place as far as the blood  thinners are concerned. He is feeling much better as well able to breathe quite nicely. I am very pleased with where things stand currently and do not see any signs of infection at this time which is also good news. 05-31-2023 upon evaluation today patient appears to be doing well with regard to his wound one of them is healed the other is getting close. Unfortunately he is having issues again with his breathing today oxygen saturation around 87 to 90% in general during the evaluation at this point. This has me concerned again as he had previous pulmonary emboli does have an appointment with pulmonology tomorrow but I do not think he needs to wait till tomorrow. Electronic Signature(s) Signed: 05/31/2023 10:26:08 AM By: [name] PA-C Entered By: [name] on 05/31/2023 07:26:07 -------------------------------------------------------------------------------- Physical Exam Details Patient Name: Date of Service: Ryan Elm RLES A. 05/31/2023 8:00 A M Medical Record Number: [id number] Patient Account Number: [id number] Date of Birth/Sex: Treating RN: 01/12/1971 (52 y.o. Ryan [name] Primary Care Provider: [name] Other Clinician: Referring Provider: Treating Provider/Extender: [name] in Treatment: 6 Constitutional Obese and well-hydrated in no acute distress. Respiratory labored, rapid respiration. Psychiatric this patient is able to make  appears to be doing well currently in regard to his wound on the leg. He and both locations seems to be showing signs of improvement  which is great news and in general I do believe there were making excellent headway towards complete closure which is great news. I do not see any signs of active infection at this time which is also excellent news. 05-16-2023 upon evaluation today patient appears to be doing well currently with regard to his wound we did actually have a check at this today but that is not the biggest thing I am concerned about at this point. He unfortunately is having some issues here with oxygen saturation dropping to around 84% times and then bouncing back up and down he is breathing rapidly he is also fluctuating with regard to pulse anywhere from 88 that I got manually to it at some points even up at 110 his body does seem to be compensating. He tells me that he sleeps with oxygen but does not wear it during the day typically he has been short of breath for a little under a week. Shortly after he was here last week. 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 He also tells me that he has been having some issues catching his breath with ambulation as well which is also a big part of the issue at this point. Fortunately I do not see any signs of obvious infection and is listening to him I could not tell any abnormal breath sounds although he may be a little bit restricted although I cannot be for sure. He tells me that the only thing he may have had a little bit of chills but nothing else significant. Nonetheless I think he needs to be evaluated at the ER ASAP. 05-26-2023 upon evaluation today patient appears to be doing well currently in regard to his wounds which are actually significantly better. 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 He is now on additional blood thinners and states that he is in a good place as far as the blood  thinners are concerned. He is feeling much better as well able to breathe quite nicely. I am very pleased with where things stand currently and do not see any signs of infection at this time which is also good news. 05-31-2023 upon evaluation today patient appears to be doing well with regard to his wound one of them is healed the other is getting close. Unfortunately he is having issues again with his breathing today oxygen saturation around 87 to 90% in general during the evaluation at this point. This has me concerned again as he had previous pulmonary emboli does have an appointment with pulmonology tomorrow but I do not think he needs to wait till tomorrow. Electronic Signature(s) Signed: 05/31/2023 10:26:08 AM By: [name] PA-C Entered By: [name] on 05/31/2023 07:26:07 -------------------------------------------------------------------------------- Physical Exam Details Patient Name: Date of Service: Ryan Elm RLES A. 05/31/2023 8:00 A M Medical Record Number: [id number] Patient Account Number: [id number] Date of Birth/Sex: Treating RN: 01/12/1971 (52 y.o. Ryan [name] Primary Care Provider: [name] Other Clinician: Referring Provider: Treating Provider/Extender: [name] in Treatment: 6 Constitutional Obese and well-hydrated in no acute distress. Respiratory labored, rapid respiration. Psychiatric this patient is able to make

## 2023-06-01 ENCOUNTER — Institutional Professional Consult (permissible substitution): Payer: No Typology Code available for payment source | Admitting: Pulmonary Disease

## 2023-06-02 ENCOUNTER — Encounter: Payer: Self-pay | Admitting: Physician Assistant

## 2023-06-02 ENCOUNTER — Ambulatory Visit: Payer: No Typology Code available for payment source | Admitting: Physician Assistant

## 2023-06-02 VITALS — BP 122/84 | HR 65 | Temp 97.8°F | Wt 342.0 lb

## 2023-06-02 DIAGNOSIS — I2781 Cor pulmonale (chronic): Secondary | ICD-10-CM

## 2023-06-02 DIAGNOSIS — I2699 Other pulmonary embolism without acute cor pulmonale: Secondary | ICD-10-CM

## 2023-06-02 DIAGNOSIS — J9601 Acute respiratory failure with hypoxia: Secondary | ICD-10-CM | POA: Diagnosis not present

## 2023-06-02 DIAGNOSIS — Z09 Encounter for follow-up examination after completed treatment for conditions other than malignant neoplasm: Secondary | ICD-10-CM

## 2023-06-02 DIAGNOSIS — Z5181 Encounter for therapeutic drug level monitoring: Secondary | ICD-10-CM | POA: Diagnosis not present

## 2023-06-02 DIAGNOSIS — Z7901 Long term (current) use of anticoagulants: Secondary | ICD-10-CM | POA: Diagnosis not present

## 2023-06-02 LAB — COAGUCHEK XS/INR WAIVED
INR: 3 — ABNORMAL HIGH (ref 0.9–1.1)
Prothrombin Time: 35.8 s

## 2023-06-02 NOTE — Progress Notes (Signed)
exam was performed according to the departmental dose-optimization program which includes automated exposure control, adjustment of the mA and/or kV according to patient size and/or use of iterative reconstruction technique.  CONTRAST:  75mL OMNIPAQUE IOHEXOL 350 MG/ML SOLN  COMPARISON:  CT angiography chest from 12/13/2022.  FINDINGS: Cardiovascular: Redemonstration of large clot in the main right pulmonary artery, essentially similar to the prior study. There  is extension of the clot into the right upper lobe lobar pulmonary artery, that is also unchanged. However, there is interval increase in the peripheral nonocclusive thrombus in the left lung lower lobe lobar pulmonary artery when compared to the prior exam. It is unclear whether this is acute or subacute, on the basis of this exam. There are additional, stable, several linear web like filling defects in the bilateral lower lobe pulmonary artery branches, compatible with chronic emboli. Redemonstration of compressed left ventricle, compatible with right heart failure/strain.  There is dilation of the main pulmonary trunk measuring up to 3.7 cm, which is nonspecific but can be seen with pulmonary artery hypertension.  Normal cardiac size. No pericardial effusion. No aortic aneurysm.  Mediastinum/Nodes: Visualized thyroid gland appears grossly unremarkable. No solid / cystic mediastinal masses. The esophagus is nondistended precluding optimal assessment. No axillary, mediastinal or hilar lymphadenopathy by size criteria.  Lungs/Pleura: The central tracheo-bronchial tree is patent. No mass or consolidation. No pleural effusion or pneumothorax. No suspicious lung nodules.  Upper Abdomen: Visualized upper abdominal viscera within normal limits. Suprarenal IVC filter again noted.  Musculoskeletal: Redemonstration of multiple dilated venous collaterals along the anterior chest wall. The visualized soft tissues of the chest wall are otherwise grossly unremarkable. No suspicious osseous lesions. There are mild multilevel degenerative changes in the visualized spine.  Review of the MIP images confirms the above findings.  IMPRESSION: 1. Redemonstration of large clot in the main right pulmonary artery, essentially similar to the prior study. There is extension of the clot into the right upper lobe lobar pulmonary artery, that is also unchanged. However, there is interval increase in the  peripheral nonocclusive thrombus in the left lung lower lobe lobar pulmonary artery when compared to the prior exam. 2. Redemonstration of compressed left ventricle, compatible with right heart failure/strain. 3. Dilation of the main pulmonary trunk measuring up to 3.7 cm, which is nonspecific but can be seen with pulmonary artery hypertension. 4. Multiple other nonacute observations, as described above.   Electronically Signed   By: Jules Schick M.D.   On: 05/16/2023 12:18   DG Chest 2 View [[id number]] Resulted: 05/16/23 0940  Order Status: Completed Updated: 05/16/23 0943  Narrative:    CLINICAL DATA:  Shortness of breath for several days. Chills. Fatigue. Hypoxia.  EXAM: CHEST - 2 VIEW  COMPARISON:  03/07/2023  FINDINGS: The heart size and mediastinal contours are within normal limits. Both lungs are clear. The visualized skeletal structures are unremarkable.  IMPRESSION: No active cardiopulmonary disease.   Electronically Signed   By: Danae Orleans M.D.   On: 05/16/2023 09:40   ECHOCARDIOGRAM REPORT       Patient Name:   Ryan [name] Date of Exam: 05/17/2023  Medical Rec #:  [id number]     Height:       67.0 in  Accession #:    [id number]    Weight:       325.0 lb  Date of Birth:  [birth year]     BSA:          2.486 m  Patient Age:  exam was performed according to the departmental dose-optimization program which includes automated exposure control, adjustment of the mA and/or kV according to patient size and/or use of iterative reconstruction technique.  CONTRAST:  75mL OMNIPAQUE IOHEXOL 350 MG/ML SOLN  COMPARISON:  CT angiography chest from 12/13/2022.  FINDINGS: Cardiovascular: Redemonstration of large clot in the main right pulmonary artery, essentially similar to the prior study. There  is extension of the clot into the right upper lobe lobar pulmonary artery, that is also unchanged. However, there is interval increase in the peripheral nonocclusive thrombus in the left lung lower lobe lobar pulmonary artery when compared to the prior exam. It is unclear whether this is acute or subacute, on the basis of this exam. There are additional, stable, several linear web like filling defects in the bilateral lower lobe pulmonary artery branches, compatible with chronic emboli. Redemonstration of compressed left ventricle, compatible with right heart failure/strain.  There is dilation of the main pulmonary trunk measuring up to 3.7 cm, which is nonspecific but can be seen with pulmonary artery hypertension.  Normal cardiac size. No pericardial effusion. No aortic aneurysm.  Mediastinum/Nodes: Visualized thyroid gland appears grossly unremarkable. No solid / cystic mediastinal masses. The esophagus is nondistended precluding optimal assessment. No axillary, mediastinal or hilar lymphadenopathy by size criteria.  Lungs/Pleura: The central tracheo-bronchial tree is patent. No mass or consolidation. No pleural effusion or pneumothorax. No suspicious lung nodules.  Upper Abdomen: Visualized upper abdominal viscera within normal limits. Suprarenal IVC filter again noted.  Musculoskeletal: Redemonstration of multiple dilated venous collaterals along the anterior chest wall. The visualized soft tissues of the chest wall are otherwise grossly unremarkable. No suspicious osseous lesions. There are mild multilevel degenerative changes in the visualized spine.  Review of the MIP images confirms the above findings.  IMPRESSION: 1. Redemonstration of large clot in the main right pulmonary artery, essentially similar to the prior study. There is extension of the clot into the right upper lobe lobar pulmonary artery, that is also unchanged. However, there is interval increase in the  peripheral nonocclusive thrombus in the left lung lower lobe lobar pulmonary artery when compared to the prior exam. 2. Redemonstration of compressed left ventricle, compatible with right heart failure/strain. 3. Dilation of the main pulmonary trunk measuring up to 3.7 cm, which is nonspecific but can be seen with pulmonary artery hypertension. 4. Multiple other nonacute observations, as described above.   Electronically Signed   By: Jules Schick M.D.   On: 05/16/2023 12:18   DG Chest 2 View [[id number]] Resulted: 05/16/23 0940  Order Status: Completed Updated: 05/16/23 0943  Narrative:    CLINICAL DATA:  Shortness of breath for several days. Chills. Fatigue. Hypoxia.  EXAM: CHEST - 2 VIEW  COMPARISON:  03/07/2023  FINDINGS: The heart size and mediastinal contours are within normal limits. Both lungs are clear. The visualized skeletal structures are unremarkable.  IMPRESSION: No active cardiopulmonary disease.   Electronically Signed   By: Danae Orleans M.D.   On: 05/16/2023 09:40   ECHOCARDIOGRAM REPORT       Patient Name:   Ryan [name] Date of Exam: 05/17/2023  Medical Rec #:  [id number]     Height:       67.0 in  Accession #:    [id number]    Weight:       325.0 lb  Date of Birth:  [birth year]     BSA:          2.486 m  Patient Age:  exam was performed according to the departmental dose-optimization program which includes automated exposure control, adjustment of the mA and/or kV according to patient size and/or use of iterative reconstruction technique.  CONTRAST:  75mL OMNIPAQUE IOHEXOL 350 MG/ML SOLN  COMPARISON:  CT angiography chest from 12/13/2022.  FINDINGS: Cardiovascular: Redemonstration of large clot in the main right pulmonary artery, essentially similar to the prior study. There  is extension of the clot into the right upper lobe lobar pulmonary artery, that is also unchanged. However, there is interval increase in the peripheral nonocclusive thrombus in the left lung lower lobe lobar pulmonary artery when compared to the prior exam. It is unclear whether this is acute or subacute, on the basis of this exam. There are additional, stable, several linear web like filling defects in the bilateral lower lobe pulmonary artery branches, compatible with chronic emboli. Redemonstration of compressed left ventricle, compatible with right heart failure/strain.  There is dilation of the main pulmonary trunk measuring up to 3.7 cm, which is nonspecific but can be seen with pulmonary artery hypertension.  Normal cardiac size. No pericardial effusion. No aortic aneurysm.  Mediastinum/Nodes: Visualized thyroid gland appears grossly unremarkable. No solid / cystic mediastinal masses. The esophagus is nondistended precluding optimal assessment. No axillary, mediastinal or hilar lymphadenopathy by size criteria.  Lungs/Pleura: The central tracheo-bronchial tree is patent. No mass or consolidation. No pleural effusion or pneumothorax. No suspicious lung nodules.  Upper Abdomen: Visualized upper abdominal viscera within normal limits. Suprarenal IVC filter again noted.  Musculoskeletal: Redemonstration of multiple dilated venous collaterals along the anterior chest wall. The visualized soft tissues of the chest wall are otherwise grossly unremarkable. No suspicious osseous lesions. There are mild multilevel degenerative changes in the visualized spine.  Review of the MIP images confirms the above findings.  IMPRESSION: 1. Redemonstration of large clot in the main right pulmonary artery, essentially similar to the prior study. There is extension of the clot into the right upper lobe lobar pulmonary artery, that is also unchanged. However, there is interval increase in the  peripheral nonocclusive thrombus in the left lung lower lobe lobar pulmonary artery when compared to the prior exam. 2. Redemonstration of compressed left ventricle, compatible with right heart failure/strain. 3. Dilation of the main pulmonary trunk measuring up to 3.7 cm, which is nonspecific but can be seen with pulmonary artery hypertension. 4. Multiple other nonacute observations, as described above.   Electronically Signed   By: Jules Schick M.D.   On: 05/16/2023 12:18   DG Chest 2 View [[id number]] Resulted: 05/16/23 0940  Order Status: Completed Updated: 05/16/23 0943  Narrative:    CLINICAL DATA:  Shortness of breath for several days. Chills. Fatigue. Hypoxia.  EXAM: CHEST - 2 VIEW  COMPARISON:  03/07/2023  FINDINGS: The heart size and mediastinal contours are within normal limits. Both lungs are clear. The visualized skeletal structures are unremarkable.  IMPRESSION: No active cardiopulmonary disease.   Electronically Signed   By: Danae Orleans M.D.   On: 05/16/2023 09:40   ECHOCARDIOGRAM REPORT       Patient Name:   Ryan [name] Date of Exam: 05/17/2023  Medical Rec #:  [id number]     Height:       67.0 in  Accession #:    [id number]    Weight:       325.0 lb  Date of Birth:  [birth year]     BSA:          2.486 m  Patient Age:  Acute Office Visit   Patient: Ryan [name]   DOB: 04/29/1971   52 y.o. Male  MRN: [id number] Visit Date: 06/02/2023  Today's healthcare provider: Oswaldo Conroy Dmitry Macomber, PA-C  Introduced myself to the patient as a Secondary school teacher and provided education on APPs in clinical practice.    Chief Complaint  Patient presents with   Hospitalization Follow-up   Subjective    HPI   He reports his breathing is better and he is feeling okay today  He went to new Pulmonology provider yesterday - they are running tests and he is likely to be referred to [facility] for further work-up  He feels better with new provider and is happy to be moving forward with potential solutions   He reports he has had some fluid accumulation since discharge- still trying to balance fluid pill and not overdoing diuresis   Transition of Care Hospital Follow up.   Hospital/Facility:[facility]  D/C Physician: Lurene Shadow, MD D/C Date: 05/25/23  Records Requested:  Records Received:  Records Reviewed: hospital dc summary   Diagnoses on Discharge:  Principal Problem:   Pulmonary emboli [facility]) Active Problems:   Acute respiratory failure with hypoxia (HCC)   AKI (acute kidney injury) (HCC)   History of pulmonary embolism and DVT   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)   Essential hypertension   CKD (chronic kidney disease), stage IIIa   Morbid obesity with BMI of 50.0-59.9, adult (HCC)   Hepatic steatosis   Hyperlipidemia associated with type 2 diabetes mellitus (HCC)   Physical deconditioning   At risk for sleep apnea   Lymphadenopathy   Anticoagulation goal of INR 2 to 3   Bilateral pulmonary embolism (HCC)   Cor pulmonale (chronic) (HCC)    Date of interactive Contact within 48 hours of discharge: 05/26/23 Contact was through: phone  Date of 7 day or 14 day face-to-face visit:    within 14 days  Outpatient Encounter Medications as of 06/02/2023  Medication Sig   albuterol (VENTOLIN  HFA) 108 (90 Base) MCG/ACT inhaler Inhale 2 puffs into the lungs every 6 (six) hours as needed for wheezing or shortness of breath.   furosemide (LASIX) 40 MG tablet Take 1 tablet (40 mg total) by mouth every other day.   magnesium oxide (MAG-OX) 400 (240 Mg) MG tablet Take 400 mg by mouth daily.   metoprolol succinate (TOPROL-XL) 25 MG 24 hr tablet Take 1 tablet (25 mg total) by mouth daily.   OXYGEN Inhale 1 L into the lungs daily.   sacubitril-valsartan (ENTRESTO) 24-26 MG Take 1 tablet by mouth 2 (two) times daily.   [DISCONTINUED] tirzepatide [facility]) 5 MG/0.5ML Pen Inject 5 mg into the skin once a week.   [DISCONTINUED] warfarin (COUMADIN) 5 MG tablet Take 1 tablet (5 mg total) by mouth daily.   No facility-administered encounter medications on file as of 06/02/2023.    Diagnostic Tests Reviewed/Disposition:  Patient underwent mechanincal pulmonary thrombectomy   CT Angio Chest PE W and/or Wo Contrast [[id number]] Resulted: 05/16/23 1218  Order Status: Completed Updated: 05/16/23 1221  Narrative:    CLINICAL DATA:  Pulmonary embolism (PE) suspected, high prob. Shortness of breath. Fatigue. Chills.  EXAM: CT ANGIOGRAPHY CHEST WITH CONTRAST  TECHNIQUE: Multidetector CT imaging of the chest was performed using the standard protocol during bolus administration of intravenous contrast. Multiplanar CT image reconstructions and MIPs were obtained to evaluate the vascular anatomy.  RADIATION DOSE REDUCTION: This  Acute Office Visit   Patient: Ryan [name]   DOB: 04/29/1971   52 y.o. Male  MRN: [id number] Visit Date: 06/02/2023  Today's healthcare provider: Oswaldo Conroy Dmitry Macomber, PA-C  Introduced myself to the patient as a Secondary school teacher and provided education on APPs in clinical practice.    Chief Complaint  Patient presents with   Hospitalization Follow-up   Subjective    HPI   He reports his breathing is better and he is feeling okay today  He went to new Pulmonology provider yesterday - they are running tests and he is likely to be referred to [facility] for further work-up  He feels better with new provider and is happy to be moving forward with potential solutions   He reports he has had some fluid accumulation since discharge- still trying to balance fluid pill and not overdoing diuresis   Transition of Care Hospital Follow up.   Hospital/Facility:[facility]  D/C Physician: Lurene Shadow, MD D/C Date: 05/25/23  Records Requested:  Records Received:  Records Reviewed: hospital dc summary   Diagnoses on Discharge:  Principal Problem:   Pulmonary emboli [facility]) Active Problems:   Acute respiratory failure with hypoxia (HCC)   AKI (acute kidney injury) (HCC)   History of pulmonary embolism and DVT   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)   Essential hypertension   CKD (chronic kidney disease), stage IIIa   Morbid obesity with BMI of 50.0-59.9, adult (HCC)   Hepatic steatosis   Hyperlipidemia associated with type 2 diabetes mellitus (HCC)   Physical deconditioning   At risk for sleep apnea   Lymphadenopathy   Anticoagulation goal of INR 2 to 3   Bilateral pulmonary embolism (HCC)   Cor pulmonale (chronic) (HCC)    Date of interactive Contact within 48 hours of discharge: 05/26/23 Contact was through: phone  Date of 7 day or 14 day face-to-face visit:    within 14 days  Outpatient Encounter Medications as of 06/02/2023  Medication Sig   albuterol (VENTOLIN  HFA) 108 (90 Base) MCG/ACT inhaler Inhale 2 puffs into the lungs every 6 (six) hours as needed for wheezing or shortness of breath.   furosemide (LASIX) 40 MG tablet Take 1 tablet (40 mg total) by mouth every other day.   magnesium oxide (MAG-OX) 400 (240 Mg) MG tablet Take 400 mg by mouth daily.   metoprolol succinate (TOPROL-XL) 25 MG 24 hr tablet Take 1 tablet (25 mg total) by mouth daily.   OXYGEN Inhale 1 L into the lungs daily.   sacubitril-valsartan (ENTRESTO) 24-26 MG Take 1 tablet by mouth 2 (two) times daily.   [DISCONTINUED] tirzepatide [facility]) 5 MG/0.5ML Pen Inject 5 mg into the skin once a week.   [DISCONTINUED] warfarin (COUMADIN) 5 MG tablet Take 1 tablet (5 mg total) by mouth daily.   No facility-administered encounter medications on file as of 06/02/2023.    Diagnostic Tests Reviewed/Disposition:  Patient underwent mechanincal pulmonary thrombectomy   CT Angio Chest PE W and/or Wo Contrast [[id number]] Resulted: 05/16/23 1218  Order Status: Completed Updated: 05/16/23 1221  Narrative:    CLINICAL DATA:  Pulmonary embolism (PE) suspected, high prob. Shortness of breath. Fatigue. Chills.  EXAM: CT ANGIOGRAPHY CHEST WITH CONTRAST  TECHNIQUE: Multidetector CT imaging of the chest was performed using the standard protocol during bolus administration of intravenous contrast. Multiplanar CT image reconstructions and MIPs were obtained to evaluate the vascular anatomy.  RADIATION DOSE REDUCTION: This  exam was performed according to the departmental dose-optimization program which includes automated exposure control, adjustment of the mA and/or kV according to patient size and/or use of iterative reconstruction technique.  CONTRAST:  75mL OMNIPAQUE IOHEXOL 350 MG/ML SOLN  COMPARISON:  CT angiography chest from 12/13/2022.  FINDINGS: Cardiovascular: Redemonstration of large clot in the main right pulmonary artery, essentially similar to the prior study. There  is extension of the clot into the right upper lobe lobar pulmonary artery, that is also unchanged. However, there is interval increase in the peripheral nonocclusive thrombus in the left lung lower lobe lobar pulmonary artery when compared to the prior exam. It is unclear whether this is acute or subacute, on the basis of this exam. There are additional, stable, several linear web like filling defects in the bilateral lower lobe pulmonary artery branches, compatible with chronic emboli. Redemonstration of compressed left ventricle, compatible with right heart failure/strain.  There is dilation of the main pulmonary trunk measuring up to 3.7 cm, which is nonspecific but can be seen with pulmonary artery hypertension.  Normal cardiac size. No pericardial effusion. No aortic aneurysm.  Mediastinum/Nodes: Visualized thyroid gland appears grossly unremarkable. No solid / cystic mediastinal masses. The esophagus is nondistended precluding optimal assessment. No axillary, mediastinal or hilar lymphadenopathy by size criteria.  Lungs/Pleura: The central tracheo-bronchial tree is patent. No mass or consolidation. No pleural effusion or pneumothorax. No suspicious lung nodules.  Upper Abdomen: Visualized upper abdominal viscera within normal limits. Suprarenal IVC filter again noted.  Musculoskeletal: Redemonstration of multiple dilated venous collaterals along the anterior chest wall. The visualized soft tissues of the chest wall are otherwise grossly unremarkable. No suspicious osseous lesions. There are mild multilevel degenerative changes in the visualized spine.  Review of the MIP images confirms the above findings.  IMPRESSION: 1. Redemonstration of large clot in the main right pulmonary artery, essentially similar to the prior study. There is extension of the clot into the right upper lobe lobar pulmonary artery, that is also unchanged. However, there is interval increase in the  peripheral nonocclusive thrombus in the left lung lower lobe lobar pulmonary artery when compared to the prior exam. 2. Redemonstration of compressed left ventricle, compatible with right heart failure/strain. 3. Dilation of the main pulmonary trunk measuring up to 3.7 cm, which is nonspecific but can be seen with pulmonary artery hypertension. 4. Multiple other nonacute observations, as described above.   Electronically Signed   By: Jules Schick M.D.   On: 05/16/2023 12:18   DG Chest 2 View [[id number]] Resulted: 05/16/23 0940  Order Status: Completed Updated: 05/16/23 0943  Narrative:    CLINICAL DATA:  Shortness of breath for several days. Chills. Fatigue. Hypoxia.  EXAM: CHEST - 2 VIEW  COMPARISON:  03/07/2023  FINDINGS: The heart size and mediastinal contours are within normal limits. Both lungs are clear. The visualized skeletal structures are unremarkable.  IMPRESSION: No active cardiopulmonary disease.   Electronically Signed   By: Danae Orleans M.D.   On: 05/16/2023 09:40   ECHOCARDIOGRAM REPORT       Patient Name:   Ryan [name] Date of Exam: 05/17/2023  Medical Rec #:  [id number]     Height:       67.0 in  Accession #:    [id number]    Weight:       325.0 lb  Date of Birth:  [birth year]     BSA:          2.486 m  Patient Age:  Acute Office Visit   Patient: Ryan [name]   DOB: 04/29/1971   52 y.o. Male  MRN: [id number] Visit Date: 06/02/2023  Today's healthcare provider: Oswaldo Conroy Dmitry Macomber, PA-C  Introduced myself to the patient as a Secondary school teacher and provided education on APPs in clinical practice.    Chief Complaint  Patient presents with   Hospitalization Follow-up   Subjective    HPI   He reports his breathing is better and he is feeling okay today  He went to new Pulmonology provider yesterday - they are running tests and he is likely to be referred to [facility] for further work-up  He feels better with new provider and is happy to be moving forward with potential solutions   He reports he has had some fluid accumulation since discharge- still trying to balance fluid pill and not overdoing diuresis   Transition of Care Hospital Follow up.   Hospital/Facility:[facility]  D/C Physician: Lurene Shadow, MD D/C Date: 05/25/23  Records Requested:  Records Received:  Records Reviewed: hospital dc summary   Diagnoses on Discharge:  Principal Problem:   Pulmonary emboli [facility]) Active Problems:   Acute respiratory failure with hypoxia (HCC)   AKI (acute kidney injury) (HCC)   History of pulmonary embolism and DVT   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)   Essential hypertension   CKD (chronic kidney disease), stage IIIa   Morbid obesity with BMI of 50.0-59.9, adult (HCC)   Hepatic steatosis   Hyperlipidemia associated with type 2 diabetes mellitus (HCC)   Physical deconditioning   At risk for sleep apnea   Lymphadenopathy   Anticoagulation goal of INR 2 to 3   Bilateral pulmonary embolism (HCC)   Cor pulmonale (chronic) (HCC)    Date of interactive Contact within 48 hours of discharge: 05/26/23 Contact was through: phone  Date of 7 day or 14 day face-to-face visit:    within 14 days  Outpatient Encounter Medications as of 06/02/2023  Medication Sig   albuterol (VENTOLIN  HFA) 108 (90 Base) MCG/ACT inhaler Inhale 2 puffs into the lungs every 6 (six) hours as needed for wheezing or shortness of breath.   furosemide (LASIX) 40 MG tablet Take 1 tablet (40 mg total) by mouth every other day.   magnesium oxide (MAG-OX) 400 (240 Mg) MG tablet Take 400 mg by mouth daily.   metoprolol succinate (TOPROL-XL) 25 MG 24 hr tablet Take 1 tablet (25 mg total) by mouth daily.   OXYGEN Inhale 1 L into the lungs daily.   sacubitril-valsartan (ENTRESTO) 24-26 MG Take 1 tablet by mouth 2 (two) times daily.   [DISCONTINUED] tirzepatide [facility]) 5 MG/0.5ML Pen Inject 5 mg into the skin once a week.   [DISCONTINUED] warfarin (COUMADIN) 5 MG tablet Take 1 tablet (5 mg total) by mouth daily.   No facility-administered encounter medications on file as of 06/02/2023.    Diagnostic Tests Reviewed/Disposition:  Patient underwent mechanincal pulmonary thrombectomy   CT Angio Chest PE W and/or Wo Contrast [[id number]] Resulted: 05/16/23 1218  Order Status: Completed Updated: 05/16/23 1221  Narrative:    CLINICAL DATA:  Pulmonary embolism (PE) suspected, high prob. Shortness of breath. Fatigue. Chills.  EXAM: CT ANGIOGRAPHY CHEST WITH CONTRAST  TECHNIQUE: Multidetector CT imaging of the chest was performed using the standard protocol during bolus administration of intravenous contrast. Multiplanar CT image reconstructions and MIPs were obtained to evaluate the vascular anatomy.  RADIATION DOSE REDUCTION: This

## 2023-06-03 ENCOUNTER — Telehealth: Payer: Self-pay | Admitting: Nurse Practitioner

## 2023-06-03 LAB — COMPREHENSIVE METABOLIC PANEL
ALT: 25 [IU]/L (ref 0–44)
AST: 19 [IU]/L (ref 0–40)
Albumin: 3.8 g/dL (ref 3.8–4.9)
Alkaline Phosphatase: 64 [IU]/L (ref 44–121)
BUN/Creatinine Ratio: 13 (ref 9–20)
BUN: 20 mg/dL (ref 6–24)
Bilirubin Total: 0.5 mg/dL (ref 0.0–1.2)
CO2: 21 mmol/L (ref 20–29)
Calcium: 9.1 mg/dL (ref 8.7–10.2)
Chloride: 106 mmol/L (ref 96–106)
Creatinine, Ser: 1.51 mg/dL — ABNORMAL HIGH (ref 0.76–1.27)
Globulin, Total: 3.6 g/dL (ref 1.5–4.5)
Glucose: 97 mg/dL (ref 70–99)
Potassium: 4.8 mmol/L (ref 3.5–5.2)
Sodium: 140 mmol/L (ref 134–144)
Total Protein: 7.4 g/dL (ref 6.0–8.5)
eGFR: 55 mL/min/{1.73_m2} — ABNORMAL LOW (ref >59)

## 2023-06-03 LAB — CBC WITH DIFFERENTIAL/PLATELET
Basophils Absolute: 0 10*3/uL (ref 0.0–0.2)
Basos: 1 %
EOS (ABSOLUTE): 0.1 10*3/uL (ref 0.0–0.4)
Eos: 2 %
Hematocrit: 45 % (ref 37.5–51.0)
Hemoglobin: 14.6 g/dL (ref 13.0–17.7)
Immature Grans (Abs): 0 10*3/uL (ref 0.0–0.1)
Immature Granulocytes: 0 %
Lymphocytes Absolute: 1.6 10*3/uL (ref 0.7–3.1)
Lymphs: 51 %
MCH: 30.4 pg (ref 26.6–33.0)
MCHC: 32.4 g/dL (ref 31.5–35.7)
MCV: 94 fL (ref 79–97)
Monocytes Absolute: 0.4 10*3/uL (ref 0.1–0.9)
Monocytes: 12 %
Neutrophils Absolute: 1.1 10*3/uL — ABNORMAL LOW (ref 1.4–7.0)
Neutrophils: 34 %
Platelets: 371 10*3/uL (ref 150–450)
RBC: 4.81 x10E6/uL (ref 4.14–5.80)
RDW: 15.2 % (ref 11.6–15.4)
WBC: 3.2 10*3/uL — ABNORMAL LOW (ref 3.4–10.8)

## 2023-06-03 NOTE — Telephone Encounter (Signed)
Forms complete and placed on your desk- just needs his med list attached

## 2023-06-03 NOTE — Telephone Encounter (Signed)
Copied from CRM [id number]. Topic: General - Other >> Jun 03, 2023  3:05 PM Franchot Heidelberg wrote: Reason for CRM: Pt called following up on form request for his work, he is asking for the forms to be faxed to   Fax: [phone] Attn: he could not provide an answer  Claim number: [id number]

## 2023-06-06 ENCOUNTER — Other Ambulatory Visit: Payer: Self-pay

## 2023-06-06 MED ORDER — TIRZEPATIDE 5 MG/0.5ML ~~LOC~~ SOAJ: 5.0000 mg | SUBCUTANEOUS | 0 refills | Status: DC

## 2023-06-06 MED ORDER — WARFARIN SODIUM 5 MG PO TABS: 5.0000 mg | ORAL_TABLET | Freq: Every day | ORAL | 1 refills | Status: DC

## 2023-06-06 NOTE — Telephone Encounter (Signed)
Paperwork has been completed. Patient was notified that his part needed to be signed and will come to the office and sign and then faxed.

## 2023-06-07 ENCOUNTER — Encounter: Payer: No Typology Code available for payment source | Admitting: Physician Assistant

## 2023-06-07 DIAGNOSIS — E11622 Type 2 diabetes mellitus with other skin ulcer: Secondary | ICD-10-CM | POA: Diagnosis not present

## 2023-06-07 NOTE — Progress Notes (Signed)
Assessment Details Patient Name: Date of Service: Ryan [name], Ryan [name]. 06/07/2023 7:45 Ryan [name] Medical Record Number: [id number] Patient Account Number: [id number] Date of Birth/Sex: Treating RN: [name] (52 y.o. Ryan [name] Primary Care Ryan [name]: [name] Other Clinician: Referring Ryan [name]: Treating Ryan [name]/Extender: Ryan [name] in Treatment: 7 Edema Assessment Left: [Left: Right] [Right: :] Assessed: [Left: No] [Right: No] [Left: Edema] [Right: :] Calf Left: Right: Point of Measurement: 36 cm From Medial Instep 50 cm 54 cm Ankle Left: Right: Point of Measurement: 10 cm From Medial Instep 30.5 cm 31 cm Knee To Floor Left: Right: From Medial Instep 42 cm 42 cm Vascular Assessment Pulses: Dorsalis Pedis Palpable: [Left:Yes] [Right:Yes] Extremity colors, hair growth, and conditions: Extremity Color: [Left:Dusky] [Right:Dusky] Hair Growth on Extremity: [Left:No] [Right:No] Temperature of Extremity: [Left:Warm] [Right:Warm] Capillary Refill: [Left:< 3 seconds] [Right:< 3 seconds] Dependent Rubor: [Left:No] [Right:No] Blanched when Elevated: [Left:No No] [Right:No No] Toe Nail Assessment Left: Right: Thick: Yes Yes Discolored: Yes Yes Deformed: Yes Yes Improper Length and Hygiene: Yes Yes Electronic Signature(s) Signed: 06/07/2023 5:02:18 PM By: Midge Aver MSN RN CNS WTA Entered By: Midge Aver on 06/07/2023 08:12:30 -------------------------------------------------------------------------------- Multi Wound Chart Details Patient Name: Date of Service: Ryan Elm RLES [name]. 06/07/2023 7:45 Ryan [name] Medical Record Number: [id number] Patient Account Number: [id number] Date of Birth/Sex: Treating RN: [name] (52 y.o. 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/[name] N/[name] Primary Etiology: Extremity Arrhythmia, Congestive Heart Failure, N/[name] N/[name] Comorbid History: Hypertension, Peripheral Venous Disease, Type II Diabetes 02/28/2023 N/[name] N/[name] Date Acquired: 7 N/[name] N/[name] Weeks of Treatment: Open N/[name] N/[name] Wound Status: No N/[name] N/[name] Wound Recurrence: 1.5x0.4x0.2 N/[name] N/[name] Measurements L x W x D (cm) 0.471 N/[name] N/[name] [name] (cm) : rea 0.094 N/[name] N/[name] Volume (cm) : 88.00% N/[name] N/[name] % Reduction in [name] rea: 92.00% N/[name] N/[name] % Reduction in Volume: Grade 2 N/[name] N/[name] Classification: Medium N/[name] N/[name] Exudate [name] mount: Serosanguineous N/[name] N/[name] Exudate Type: red, brown N/[name] N/[name] Exudate Color: None Present (0%) N/[name] N/[name] Granulation [name] mount: Small (1-33%) N/[name] N/[name] Necrotic [name] mount: Fat Layer (Subcutaneous Tissue): Yes N/[name] N/[name] Exposed Structures: Fascia: No Tendon: No Muscle: No Joint: No Bone: No None N/[name] N/[name] Epithelialization: Debridement - Excisional N/[name] N/[name] Debridement: Pre-procedure Verification/Time Out 08:07 N/[name] N/[name] Taken: Lidocaine 4% Topical Solution N/[name] N/[name] Pain Control: Subcutaneous, Slough N/[name] N/[name] Tissue Debrided: Skin/Subcutaneous Tissue N/[name] N/[name] Level: 0.47 N/[name] N/[name] Debridement [name] (sq cm): rea Curette N/[name] N/[name] Instrument: Moderate N/[name] N/[name] Bleeding: Pressure N/[name] N/[name] Hemostasis [name] chieved: 0 N/[name] N/[name] Procedural Pain: 0 N/[name] N/[name] Post Procedural Pain: Procedure was tolerated well N/[name] N/[name] Debridement Treatment Response: 1.5x0.4x0.2 N/[name]  N/[name] Post Debridement Measurements L x W x D (cm) 0.094 N/[name] N/[name] Post Debridement Volume: (cm) Debridement N/[name] N/[name] Procedures Performed: Treatment Notes Electronic Signature(s) Signed: 06/07/2023 5:02:18 PM By: Midge Aver MSN RN CNS WTA Entered By: Midge Aver on 06/07/2023 08:10:24 -------------------------------------------------------------------------------- Multi-Disciplinary Care Plan Details Patient Name: Date of Service: Ryan Elm RLES [name]. 06/07/2023 7:45 Ryan [name] Medical Record Number: [id number] Patient Account Number: [id number] Ryan [name], Ryan [name] ([id number]) [phone].pdf Page 5 of 8 Date of Birth/Sex: Treating  Account Number: [id number] Date of Birth/Sex: Treating RN: Ryan [name] (52 y.o. Ryan [name] Primary Care Ryan [name]: [name] Other Clinician: Referring Ryan [name]: Treating Ryan [name]/Extender: Ryan [name] in Treatment: 7 Vital Signs Time Taken: 07:48 Temperature (F): 97.6 Height (in): 67 Pulse (bpm): 50 Weight (lbs): 338 Respiratory Rate (breaths/min): 16 Body Mass Index (BMI): 52.9 Blood Pressure (mmHg):  112/58 Reference Range: 80 - 120 mg / dl Electronic Signature(s) Signed: 06/07/2023 5:02:18 PM By: Midge Aver MSN RN CNS WTA Entered By: Midge Aver on 06/07/2023 07:53:51 Ryan [name], Ryan [name] ([id number]) [id number] Page 8 of 8  Ryan [name], Ryan [name] ([id number]) [id number] Page 1 of 8 Visit Report for 06/07/2023 Arrival Information Details Patient Name: Date of Service: Ryan [name], Ryan [name]. 06/07/2023 7:45 Ryan [name] Medical Record Number: [id number] Patient Account Number: [id number] Date of Birth/Sex: Treating RN: [name] (52 y.o. Ryan [name] Primary Care [name]: [name] Other Clinician: Referring Ryan [name]: Treating Ryan [name]/Extender: Ryan [name] in Treatment: 7 Visit Information History Since Last Visit Added or deleted any medications: No Patient Arrived: Ambulatory Any new allergies or adverse reactions: No Arrival Time: 07:45 Has Dressing in Place as Prescribed: Yes Accompanied By: self Has Compression in Place as Prescribed: Yes Transfer Assistance: None Pain Present Now: No Patient Identification Verified: Yes Secondary Verification Process Completed: Yes Patient Requires Transmission-Based Precautions: No Patient Has Alerts: Yes Patient Alerts: Patient on Blood Thinner diabetic Electronic Signature(s) Signed: 06/07/2023 5:02:18 PM By: Midge Aver MSN RN CNS WTA Entered By: Midge Aver on 06/07/2023 07:48:44 -------------------------------------------------------------------------------- Clinic Level of Care Assessment Details Patient Name: Date of Service: Ryan Elm RLES [name]. 06/07/2023 7:45 Ryan [name] Medical Record Number: [id number] Patient Account Number: [id number] Date of Birth/Sex: Treating RN: [name] (52 y.o. Ryan [name] Primary Care Zaidyn Claire: [name] Other Clinician: Referring Arelene Moroni: Treating Coraline Talwar/Extender: Ryan [name] in Treatment: 7 Clinic Level of Care Assessment Items TOOL 1 Quantity Score []  - 0 Use when EandM and Procedure is performed on INITIAL visit ASSESSMENTS - Nursing Assessment / Reassessment []  - 0 General Physical Exam (combine w/ comprehensive assessment (listed just below)  when performed on new pt. evals) []  - 0 Comprehensive Assessment (HX, ROS, Risk Assessments, Wounds Hx, etc.) ASSESSMENTS - Wound and Skin Assessment / Reassessment []  - 0 Dermatologic / Skin Assessment (not related to wound area) Ryan [name], Ryan [name] ([id number]) [phone].pdf Page 2 of 8 ASSESSMENTS - Ostomy and/or Continence Assessment and Care []  - 0 Incontinence Assessment and Management []  - 0 Ostomy Care Assessment and Management (repouching, etc.) PROCESS - Coordination of Care []  - 0 Simple Patient / Family Education for ongoing care []  - 0 Complex (extensive) Patient / Family Education for ongoing care []  - 0 Staff obtains Chiropractor, Records, T Results / Process Orders est []  - 0 Staff telephones HHA, Nursing Homes / Clarify orders / etc []  - 0 Routine Transfer to another Facility (non-emergent condition) []  - 0 Routine Hospital Admission (non-emergent condition) []  - 0 New Admissions / Manufacturing engineer / Ordering NPWT Apligraf, etc. , []  - 0 Emergency Hospital Admission (emergent condition) PROCESS - Special Needs []  - 0 Pediatric / Minor Patient Management []  - 0 Isolation Patient Management []  - 0 Hearing / Language / Visual special needs []  - 0 Assessment of Community assistance (transportation, D/C planning, etc.) []  - 0 Additional assistance / Altered mentation []  - 0 Support Surface(s) Assessment (bed, cushion, seat, etc.) INTERVENTIONS - Miscellaneous []  - 0 External ear exam []  - 0 Patient Transfer (multiple staff / Nurse, adult / Similar devices) []  - 0 Simple Staple / Suture removal (25 or less) []  - 0 Complex Staple / Suture removal (26 or more) []  - 0 Hypo/Hyperglycemic Management (do not check if billed separately) []  - 0 Ankle / Brachial Index (ABI) - do not check if billed separately Has the patient been seen at the hospital within the last three years: Yes Total Score: 0 Level Of Care: ____ Electronic  Signature(s) Signed: 06/07/2023 5:02:18 PM By: Midge Aver MSN RN CNS WTA Entered By: Midge Aver on 06/07/2023 08:10:57 -------------------------------------------------------------------------------- Lower Extremity  Account Number: [id number] Date of Birth/Sex: Treating RN: Ryan [name] (52 y.o. Ryan [name] Primary Care Ryan [name]: [name] Other Clinician: Referring Ryan [name]: Treating Ryan [name]/Extender: Ryan [name] in Treatment: 7 Vital Signs Time Taken: 07:48 Temperature (F): 97.6 Height (in): 67 Pulse (bpm): 50 Weight (lbs): 338 Respiratory Rate (breaths/min): 16 Body Mass Index (BMI): 52.9 Blood Pressure (mmHg):  112/58 Reference Range: 80 - 120 mg / dl Electronic Signature(s) Signed: 06/07/2023 5:02:18 PM By: Midge Aver MSN RN CNS WTA Entered By: Midge Aver on 06/07/2023 07:53:51 Ryan [name], Ryan [name] ([id number]) [id number] Page 8 of 8

## 2023-06-07 NOTE — Assessment & Plan Note (Addendum)
Acute, appears resolved at this time Patient was hospitalized after becoming hypoxic during wound care appointment. He has been referred to [facility] pulmonology clinic for further workup and is looking forward to working with new pulmonary provider Reviewed medication discharge instructions, discussed plan for further management Will continue to collaborate with pulmonology recommendations to optimize management Follow-up in 4 weeks or sooner if concerns arise

## 2023-06-07 NOTE — Progress Notes (Addendum)
present and had to be removed. He is now on additional blood thinners and states that he is in [name] good place as far as the blood thinners are concerned. He is feeling much better as well able to breathe quite  nicely. I am very pleased with where things stand currently and do not see any signs of infection at this time which is also good news. 05-31-2023 upon evaluation today patient appears to be doing well with regard to his wound one of them is healed the other is getting close. Unfortunately he is having issues again with his breathing today oxygen saturation around 87 to 90% in general during the evaluation at this point. This has me concerned again as he had previous pulmonary emboli does have an appointment with pulmonology tomorrow but I do not think he needs to wait till tomorrow. 06-07-2023 upon evaluation today patient appears to be doing well currently in regard to his wound. He is tolerating the dressing changes this looks to be doing better in general I think removing in the right direction. I do not see any signs of active infection at this time. No fevers, chills, nausea, vomiting, or diarrhea. Electronic Signature(s) Signed: 06/07/2023 8:11:52 AM By: Ryan Derry PA-C Entered By: Ryan [name] on 06/07/2023 08:11:52 -------------------------------------------------------------------------------- Physical Exam Details Patient Name: Date of Service: Ryan [name], Ryan RLES [name]. 06/07/2023 7:45 [name] Medical Record Number: [id number] Patient Account Number: [id number] Date of Birth/Sex: Treating RN: [birth year] (52 y.o. Ryan [name] Primary Care Provider: Larae [name] Other Clinician: Referring Provider: Treating Provider/Extender: Ryan [name] in Treatment: 7 Constitutional Well-nourished and well-hydrated in no acute distress. Respiratory normal breathing without difficulty. Psychiatric this patient is able to make decisions and demonstrates good insight into disease process. Alert and Oriented x 3. pleasant and cooperative. Notes Upon inspection patient's wound bed actually showed signs of good granulation and epithelization at this point. Fortunately I do not see any  signs of active infection locally or systemically which is great news and in general I do believe that we are making headway towards closure. I did perform debridement clearway some of the necrotic debris. Patient tolerated this today without complication. Electronic Signature(s) Signed: 06/07/2023 8:12:35 AM By: Ryan Derry PA-C Entered By: Ryan [name] on 06/07/2023 08:12:35 -------------------------------------------------------------------------------- Physician Orders Details Patient Name: Date of Service: Ryan Elm RLES [name]. 06/07/2023 7:45 [name] Ryan [name], Ryan [name] ([id number]) [phone].pdf Page 4 of 9 Medical Record Number: [id number] Patient Account Number: [id number] Date of Birth/Sex: Treating RN: 1970/09/27 (52 y.o. 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 Bathing/ Shower/ Hygiene May shower with wound dressing protected with water repellent cover or cast protector. Anesthetic (Use 'Patient Medications' Section for Anesthetic Order Entry) Lidocaine applied to  wound bed Edema Control - Lymphedema /  Segmental Compressive Device / Other UrgoK2 - Large left leg Tubigrip double layer applied - FF right leg Elevate, Exercise Daily and [name] void Standing for Long Periods of Time. Elevate legs to the level of the heart and pump ankles as often as possible Elevate leg(s) parallel to the floor when sitting. Wound Treatment Wound #1 - Lower Leg Wound Laterality: Left, Medial Cleanser: Soap and Water 1 x Per Week/30 Days Discharge Instructions: Gently cleanse wound with antibacterial soap, rinse and pat dry prior to dressing wounds Cleanser: Wound Cleanser 1 x Per Week/30 Days Discharge Instructions: Wash your hands with soap and water. Remove old dressing, discard into plastic bag and place into trash. Cleanse the wound with Wound Cleanser prior to applying [name] clean dressing using gauze sponges, not tissues or cotton balls. Do not scrub or use excessive force. Pat dry using gauze sponges, not tissue or cotton balls. Prim Dressing: Hydrofera Blue Ready Transfer Foam, 2.5x2.5 (in/in) 1 x Per Week/30 Days ary Discharge Instructions: Apply Hydrofera Blue Ready to wound bed as directed Secondary Dressing: ABD Pad 5x9 (in/in) 1 x Per Week/30 Days Discharge Instructions: Cover with ABD pad Compression Wrap: Urgo K2, two layer compression system, large 1 x Per Week/30 Days Electronic Signature(s) Signed: 06/07/2023 11:40:05 AM By: Ryan Derry PA-C Signed: 06/07/2023 5:02:18 PM By: Midge Aver MSN RN CNS WTA Entered By: Midge Aver on 06/07/2023 08:10:50 Ryan [name], Ryan [name] ([id number]) [id number] Page 5 of 9 -------------------------------------------------------------------------------- Problem List Details Patient Name: Date of Service: Ryan [name], Ryan [name]. 06/07/2023 7:45 [name] Medical Record Number: [id number] Patient Account Number: [id number] Date of Birth/Sex: Treating RN: Jan 01, 1971 (52 y.o. 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30 Chronic kidney disease, stage 3 unspecified 04/18/2023 No Yes Inactive Problems Resolved Problems Electronic Signature(s) Signed: 06/07/2023 7:57:22 AM By: Wonda Olds, Ryan [name] ([id number]) [id number] Page 6 of 9 Entered By: Ryan [name] on 06/07/2023 07:57:21 -------------------------------------------------------------------------------- Progress Note Details Patient Name: Date of Service: Ryan [name], Ryan [name]. 06/07/2023 7:45 [name] Medical Record Number: [id number] Patient Account Number: [id number] Date of Birth/Sex: Treating RN: [birth year] (52 y.o. Ryan [name] Primary Care Provider: Larae [name] Other Clinician: Referring Provider: Treating Provider/Extender: Ryan [name] in Treatment: 7 Subjective Chief Complaint Information obtained from Patient Left leg and left foot ulcers History of Present Illness (HPI) Chronic/Inactive Condition: 04-18-2023 patient  screening ABI today was 0.83 and this was on the left. With that being said he had previous ABIs done formally on 06-25-2021 with [name] right ABI of 1.12 and [name] TBI of 1.03 on the left she had an ABI of 1.12 with [name] TBI of 0.98. 04-18-2023 upon evaluation today patient presents for initial inspection  KAIZER, DISSINGER [name] ([id number]) [id number] Page 1 of 9 Visit Report for 06/07/2023 Chief Complaint Document Details Patient Name: Date of Service: Ryan [name], Ryan [name]. 06/07/2023 7:45 [name] Medical Record Number: [id number] Patient Account Number: [id number] Date of Birth/Sex: Treating RN: 1971-01-29 (52 y.o. Ryan [name] Primary Care Provider: Larae [name] Other Clinician: Referring Provider: Treating Provider/Extender: Ryan [name] in Treatment: 7 Information Obtained from: Patient Chief Complaint Left leg and left foot ulcers Electronic Signature(s) Signed: 06/07/2023 7:57:27 AM By: Ryan Derry PA-C Entered By: Ryan [name] on 06/07/2023 07:57:27 -------------------------------------------------------------------------------- Debridement Details Patient Name: Date of Service: Ryan Elm RLES [name]. 06/07/2023 7:45 [name] Medical Record Number: [id number] Patient Account Number: [id number] Date of Birth/Sex: Treating RN: 1971/02/13 (52 y.o. Ryan [name] Primary Care Provider: Larae [name] Other Clinician: Referring Provider: Treating Provider/Extender: Ryan [name] in Treatment: 7 Debridement Performed for Assessment: Wound #1 Left,Medial Lower Leg Performed By: Physician Ryan Derry, PA-C Debridement Type: Debridement Severity of Tissue Pre Debridement: Fat layer exposed Level of Consciousness (Pre-procedure): Awake and Alert Pre-procedure Verification/Time Out Yes - 08:07 Taken: Start Time: 08:07 Pain Control: Lidocaine 4% T opical Solution Percent of Wound Bed Debrided: 100% T Area Debrided (cm): otal 0.47 Tissue and other material debrided: Viable, Non-Viable, Slough, Subcutaneous, Slough Level: Skin/Subcutaneous Tissue Debridement Description: Excisional Instrument: Curette Bleeding: Moderate Hemostasis Achieved: Pressure Procedural Pain: 0 Post Procedural Pain: 0 Liddell, Pope [name] ([id number])  [phone].pdf Page 2 of 9 Response to Treatment: Procedure was tolerated well Level of Consciousness (Post- Awake and Alert procedure): Post Debridement Measurements of Total Wound Length: (cm) 1.5 Width: (cm) 0.4 Depth: (cm) 0.2 Volume: (cm) 0.094 Character of Wound/Ulcer Post Debridement: Stable Severity of Tissue Post Debridement: Fat layer exposed Post Procedure Diagnosis Same as Pre-procedure Electronic Signature(s) Signed: 06/07/2023 11:40:05 AM By: Ryan Derry PA-C Signed: 06/07/2023 5:02:18 PM By: Midge Aver MSN RN CNS WTA Entered By: Midge Aver on 06/07/2023 08:08:41 -------------------------------------------------------------------------------- HPI Details Patient Name: Date of Service: Ryan Elm RLES [name]. 06/07/2023 7:45 [name] Medical Record Number: [id number] Patient Account Number: [id number] Date of Birth/Sex: Treating RN: 09/26/1970 (52 y.o. Ryan [name] Primary Care Provider: Larae [name] Other Clinician: Referring Provider: Treating Provider/Extender: Ryan [name] in Treatment: 7 History of Present Illness Chronic/Inactive Conditions Condition 1: 04-18-2023 patient screening ABI today was 0.83 and this was on the left. With that being said he had previous ABIs done formally on 06-25-2021 with [name] right ABI of 1.12 and [name] TBI of 1.03 on the left she had an ABI of 1.12 with [name] TBI of 0.98. HPI Description: 04-18-2023 upon evaluation today patient presents for initial inspection here in our clinic some concerning issues he is having with his left proximal foot as well as his left lower extremity. This actually appears to be more of [name] situation to be honest. He does have chronic lower extremity swelling secondary to what appears to be venous stasis he has not really had any wounds significantly before and tells me that it has been [name] probably even 12 or so since he has last used his  lymphedema pumps. He is not even sure if they are actually functioning anymore or not. With that being said this is 1 thing that I did want him to look into. He is on [name] fluid pill and he does not wear compression socks on [name] regular basis although I definitely think that something that he also really needs to  KAIZER, DISSINGER [name] ([id number]) [id number] Page 1 of 9 Visit Report for 06/07/2023 Chief Complaint Document Details Patient Name: Date of Service: Ryan [name], Ryan [name]. 06/07/2023 7:45 [name] Medical Record Number: [id number] Patient Account Number: [id number] Date of Birth/Sex: Treating RN: 1971-01-29 (52 y.o. Ryan [name] Primary Care Provider: Larae [name] Other Clinician: Referring Provider: Treating Provider/Extender: Ryan [name] in Treatment: 7 Information Obtained from: Patient Chief Complaint Left leg and left foot ulcers Electronic Signature(s) Signed: 06/07/2023 7:57:27 AM By: Ryan Derry PA-C Entered By: Ryan [name] on 06/07/2023 07:57:27 -------------------------------------------------------------------------------- Debridement Details Patient Name: Date of Service: Ryan Elm RLES [name]. 06/07/2023 7:45 [name] Medical Record Number: [id number] Patient Account Number: [id number] Date of Birth/Sex: Treating RN: 1971/02/13 (52 y.o. Ryan [name] Primary Care Provider: Larae [name] Other Clinician: Referring Provider: Treating Provider/Extender: Ryan [name] in Treatment: 7 Debridement Performed for Assessment: Wound #1 Left,Medial Lower Leg Performed By: Physician Ryan Derry, PA-C Debridement Type: Debridement Severity of Tissue Pre Debridement: Fat layer exposed Level of Consciousness (Pre-procedure): Awake and Alert Pre-procedure Verification/Time Out Yes - 08:07 Taken: Start Time: 08:07 Pain Control: Lidocaine 4% T opical Solution Percent of Wound Bed Debrided: 100% T Area Debrided (cm): otal 0.47 Tissue and other material debrided: Viable, Non-Viable, Slough, Subcutaneous, Slough Level: Skin/Subcutaneous Tissue Debridement Description: Excisional Instrument: Curette Bleeding: Moderate Hemostasis Achieved: Pressure Procedural Pain: 0 Post Procedural Pain: 0 Liddell, Pope [name] ([id number])  [phone].pdf Page 2 of 9 Response to Treatment: Procedure was tolerated well Level of Consciousness (Post- Awake and Alert procedure): Post Debridement Measurements of Total Wound Length: (cm) 1.5 Width: (cm) 0.4 Depth: (cm) 0.2 Volume: (cm) 0.094 Character of Wound/Ulcer Post Debridement: Stable Severity of Tissue Post Debridement: Fat layer exposed Post Procedure Diagnosis Same as Pre-procedure Electronic Signature(s) Signed: 06/07/2023 11:40:05 AM By: Ryan Derry PA-C Signed: 06/07/2023 5:02:18 PM By: Midge Aver MSN RN CNS WTA Entered By: Midge Aver on 06/07/2023 08:08:41 -------------------------------------------------------------------------------- HPI Details Patient Name: Date of Service: Ryan Elm RLES [name]. 06/07/2023 7:45 [name] Medical Record Number: [id number] Patient Account Number: [id number] Date of Birth/Sex: Treating RN: 09/26/1970 (52 y.o. Ryan [name] Primary Care Provider: Larae [name] Other Clinician: Referring Provider: Treating Provider/Extender: Ryan [name] in Treatment: 7 History of Present Illness Chronic/Inactive Conditions Condition 1: 04-18-2023 patient screening ABI today was 0.83 and this was on the left. With that being said he had previous ABIs done formally on 06-25-2021 with [name] right ABI of 1.12 and [name] TBI of 1.03 on the left she had an ABI of 1.12 with [name] TBI of 0.98. HPI Description: 04-18-2023 upon evaluation today patient presents for initial inspection here in our clinic some concerning issues he is having with his left proximal foot as well as his left lower extremity. This actually appears to be more of [name] situation to be honest. He does have chronic lower extremity swelling secondary to what appears to be venous stasis he has not really had any wounds significantly before and tells me that it has been [name] probably even 12 or so since he has last used his  lymphedema pumps. He is not even sure if they are actually functioning anymore or not. With that being said this is 1 thing that I did want him to look into. He is on [name] fluid pill and he does not wear compression socks on [name] regular basis although I definitely think that something that he also really needs to  Segmental Compressive Device / Other UrgoK2 - Large left leg Tubigrip double layer applied - FF right leg Elevate, Exercise Daily and [name] void Standing for Long Periods of Time. Elevate legs to the level of the heart and pump ankles as often as possible Elevate leg(s) parallel to the floor when sitting. Wound Treatment Wound #1 - Lower Leg Wound Laterality: Left, Medial Cleanser: Soap and Water 1 x Per Week/30 Days Discharge Instructions: Gently cleanse wound with antibacterial soap, rinse and pat dry prior to dressing wounds Cleanser: Wound Cleanser 1 x Per Week/30 Days Discharge Instructions: Wash your hands with soap and water. Remove old dressing, discard into plastic bag and place into trash. Cleanse the wound with Wound Cleanser prior to applying [name] clean dressing using gauze sponges, not tissues or cotton balls. Do not scrub or use excessive force. Pat dry using gauze sponges, not tissue or cotton balls. Prim Dressing: Hydrofera Blue Ready Transfer Foam, 2.5x2.5 (in/in) 1 x Per Week/30 Days ary Discharge Instructions: Apply Hydrofera Blue Ready to wound bed as directed Secondary Dressing: ABD Pad 5x9 (in/in) 1 x Per Week/30 Days Discharge Instructions: Cover with ABD pad Compression Wrap: Urgo K2, two layer compression system, large 1 x Per Week/30 Days Electronic Signature(s) Signed: 06/07/2023 11:40:05 AM By: Ryan Derry PA-C Signed: 06/07/2023 5:02:18 PM By: Midge Aver MSN RN CNS WTA Entered By: Midge Aver on 06/07/2023 08:10:50 Ryan [name], Ryan [name] ([id number]) [id number] Page 5 of 9 -------------------------------------------------------------------------------- Problem List Details Patient Name: Date of Service: Ryan [name], Ryan [name]. 06/07/2023 7:45 [name] Medical Record Number: [id number] Patient Account Number: [id number] Date of Birth/Sex: Treating RN: Jan 01, 1971 (52 y.o. 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30 Chronic kidney disease, stage 3 unspecified 04/18/2023 No Yes Inactive Problems Resolved Problems Electronic Signature(s) Signed: 06/07/2023 7:57:22 AM By: Wonda Olds, Ryan [name] ([id number]) [id number] Page 6 of 9 Entered By: Ryan [name] on 06/07/2023 07:57:21 -------------------------------------------------------------------------------- Progress Note Details Patient Name: Date of Service: Ryan [name], Ryan [name]. 06/07/2023 7:45 [name] Medical Record Number: [id number] Patient Account Number: [id number] Date of Birth/Sex: Treating RN: [birth year] (52 y.o. Ryan [name] Primary Care Provider: Larae [name] Other Clinician: Referring Provider: Treating Provider/Extender: Ryan [name] in Treatment: 7 Subjective Chief Complaint Information obtained from Patient Left leg and left foot ulcers History of Present Illness (HPI) Chronic/Inactive Condition: 04-18-2023 patient  screening ABI today was 0.83 and this was on the left. With that being said he had previous ABIs done formally on 06-25-2021 with [name] right ABI of 1.12 and [name] TBI of 1.03 on the left she had an ABI of 1.12 with [name] TBI of 0.98. 04-18-2023 upon evaluation today patient presents for initial inspection  will contact our office for additional recommendations. Electronic Signature(s) Signed: 06/07/2023 8:16:50 AM By: Ryan Derry PA-C Entered By: Ryan [name] on 06/07/2023 08:16:50 -------------------------------------------------------------------------------- SuperBill Details Patient Name: Date of Service: Ryan Elm RLES [name]. 06/07/2023 Medical Record Number: [id number] Patient Account Number: [id number] Date of Birth/Sex: Treating RN: 02-Jul-1971 (52 y.o. 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 I89.0 Lymphedema, not elsewhere classified 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 I10 Essential (primary) hypertension I73.89 Other specified peripheral vascular diseases N18.30 Chronic kidney disease, stage 3 unspecified Facility Procedures : CPT4 Code: [id number] Description: 11042 - DEB SUBQ TISSUE 20 SQ CM/< ICD-10 Diagnosis Description L97.822 Non-pressure chronic ulcer of other part of left lower leg with fat layer expos Modifier: ed Quantity: 1 Physician Procedures : CPT4 Code Description Modifier YOON, BARCA [name] ([id number]) [id number] Pa [id number] 11042 - WC PHYS SUBQ TISS 20 SQ CM 1 ICD-10 Diagnosis Description L97.822 Non-pressure chronic ulcer of other part of left lower leg with fat  layer exposed Quantity: ge 9 of 9 Electronic Signature(s) Signed: 06/07/2023 8:21:52 AM By: Ryan Derry PA-C Entered By: Ryan [name] on 06/07/2023 08:21:52  Segmental Compressive Device / Other UrgoK2 - Large left leg Tubigrip double layer applied - FF right leg Elevate, Exercise Daily and [name] void Standing for Long Periods of Time. Elevate legs to the level of the heart and pump ankles as often as possible Elevate leg(s) parallel to the floor when sitting. Wound Treatment Wound #1 - Lower Leg Wound Laterality: Left, Medial Cleanser: Soap and Water 1 x Per Week/30 Days Discharge Instructions: Gently cleanse wound with antibacterial soap, rinse and pat dry prior to dressing wounds Cleanser: Wound Cleanser 1 x Per Week/30 Days Discharge Instructions: Wash your hands with soap and water. Remove old dressing, discard into plastic bag and place into trash. Cleanse the wound with Wound Cleanser prior to applying [name] clean dressing using gauze sponges, not tissues or cotton balls. Do not scrub or use excessive force. Pat dry using gauze sponges, not tissue or cotton balls. Prim Dressing: Hydrofera Blue Ready Transfer Foam, 2.5x2.5 (in/in) 1 x Per Week/30 Days ary Discharge Instructions: Apply Hydrofera Blue Ready to wound bed as directed Secondary Dressing: ABD Pad 5x9 (in/in) 1 x Per Week/30 Days Discharge Instructions: Cover with ABD pad Compression Wrap: Urgo K2, two layer compression system, large 1 x Per Week/30 Days Electronic Signature(s) Signed: 06/07/2023 11:40:05 AM By: Ryan Derry PA-C Signed: 06/07/2023 5:02:18 PM By: Midge Aver MSN RN CNS WTA Entered By: Midge Aver on 06/07/2023 08:10:50 Ryan [name], Ryan [name] ([id number]) [id number] Page 5 of 9 -------------------------------------------------------------------------------- Problem List Details Patient Name: Date of Service: Ryan [name], Ryan [name]. 06/07/2023 7:45 [name] Medical Record Number: [id number] Patient Account Number: [id number] Date of Birth/Sex: Treating RN: Jan 01, 1971 (52 y.o. 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30 Chronic kidney disease, stage 3 unspecified 04/18/2023 No Yes Inactive Problems Resolved Problems Electronic Signature(s) Signed: 06/07/2023 7:57:22 AM By: Wonda Olds, Ryan [name] ([id number]) [id number] Page 6 of 9 Entered By: Ryan [name] on 06/07/2023 07:57:21 -------------------------------------------------------------------------------- Progress Note Details Patient Name: Date of Service: Ryan [name], Ryan [name]. 06/07/2023 7:45 [name] Medical Record Number: [id number] Patient Account Number: [id number] Date of Birth/Sex: Treating RN: [birth year] (52 y.o. Ryan [name] Primary Care Provider: Larae [name] Other Clinician: Referring Provider: Treating Provider/Extender: Ryan [name] in Treatment: 7 Subjective Chief Complaint Information obtained from Patient Left leg and left foot ulcers History of Present Illness (HPI) Chronic/Inactive Condition: 04-18-2023 patient  screening ABI today was 0.83 and this was on the left. With that being said he had previous ABIs done formally on 06-25-2021 with [name] right ABI of 1.12 and [name] TBI of 1.03 on the left she had an ABI of 1.12 with [name] TBI of 0.98. 04-18-2023 upon evaluation today patient presents for initial inspection  KAIZER, DISSINGER [name] ([id number]) [id number] Page 1 of 9 Visit Report for 06/07/2023 Chief Complaint Document Details Patient Name: Date of Service: Ryan [name], Ryan [name]. 06/07/2023 7:45 [name] Medical Record Number: [id number] Patient Account Number: [id number] Date of Birth/Sex: Treating RN: 1971-01-29 (52 y.o. Ryan [name] Primary Care Provider: Larae [name] Other Clinician: Referring Provider: Treating Provider/Extender: Ryan [name] in Treatment: 7 Information Obtained from: Patient Chief Complaint Left leg and left foot ulcers Electronic Signature(s) Signed: 06/07/2023 7:57:27 AM By: Ryan Derry PA-C Entered By: Ryan [name] on 06/07/2023 07:57:27 -------------------------------------------------------------------------------- Debridement Details Patient Name: Date of Service: Ryan Elm RLES [name]. 06/07/2023 7:45 [name] Medical Record Number: [id number] Patient Account Number: [id number] Date of Birth/Sex: Treating RN: 1971/02/13 (52 y.o. Ryan [name] Primary Care Provider: Larae [name] Other Clinician: Referring Provider: Treating Provider/Extender: Ryan [name] in Treatment: 7 Debridement Performed for Assessment: Wound #1 Left,Medial Lower Leg Performed By: Physician Ryan Derry, PA-C Debridement Type: Debridement Severity of Tissue Pre Debridement: Fat layer exposed Level of Consciousness (Pre-procedure): Awake and Alert Pre-procedure Verification/Time Out Yes - 08:07 Taken: Start Time: 08:07 Pain Control: Lidocaine 4% T opical Solution Percent of Wound Bed Debrided: 100% T Area Debrided (cm): otal 0.47 Tissue and other material debrided: Viable, Non-Viable, Slough, Subcutaneous, Slough Level: Skin/Subcutaneous Tissue Debridement Description: Excisional Instrument: Curette Bleeding: Moderate Hemostasis Achieved: Pressure Procedural Pain: 0 Post Procedural Pain: 0 Liddell, Pope [name] ([id number])  [phone].pdf Page 2 of 9 Response to Treatment: Procedure was tolerated well Level of Consciousness (Post- Awake and Alert procedure): Post Debridement Measurements of Total Wound Length: (cm) 1.5 Width: (cm) 0.4 Depth: (cm) 0.2 Volume: (cm) 0.094 Character of Wound/Ulcer Post Debridement: Stable Severity of Tissue Post Debridement: Fat layer exposed Post Procedure Diagnosis Same as Pre-procedure Electronic Signature(s) Signed: 06/07/2023 11:40:05 AM By: Ryan Derry PA-C Signed: 06/07/2023 5:02:18 PM By: Midge Aver MSN RN CNS WTA Entered By: Midge Aver on 06/07/2023 08:08:41 -------------------------------------------------------------------------------- HPI Details Patient Name: Date of Service: Ryan Elm RLES [name]. 06/07/2023 7:45 [name] Medical Record Number: [id number] Patient Account Number: [id number] Date of Birth/Sex: Treating RN: 09/26/1970 (52 y.o. Ryan [name] Primary Care Provider: Larae [name] Other Clinician: Referring Provider: Treating Provider/Extender: Ryan [name] in Treatment: 7 History of Present Illness Chronic/Inactive Conditions Condition 1: 04-18-2023 patient screening ABI today was 0.83 and this was on the left. With that being said he had previous ABIs done formally on 06-25-2021 with [name] right ABI of 1.12 and [name] TBI of 1.03 on the left she had an ABI of 1.12 with [name] TBI of 0.98. HPI Description: 04-18-2023 upon evaluation today patient presents for initial inspection here in our clinic some concerning issues he is having with his left proximal foot as well as his left lower extremity. This actually appears to be more of [name] situation to be honest. He does have chronic lower extremity swelling secondary to what appears to be venous stasis he has not really had any wounds significantly before and tells me that it has been [name] probably even 12 or so since he has last used his  lymphedema pumps. He is not even sure if they are actually functioning anymore or not. With that being said this is 1 thing that I did want him to look into. He is on [name] fluid pill and he does not wear compression socks on [name] regular basis although I definitely think that something that he also really needs to

## 2023-06-07 NOTE — Assessment & Plan Note (Signed)
Chronic, ongoing condition Patient was admitted to the hospital 05/17/2023 for difficulty breathing and hypoxia.  He was found to have extensive pulmonary emboli which required mechanical thrombectomy.  He reports today that he is feeling much better.  Oxygen saturation is 92% on room air but he does not appear to be straining or tachypneic He has been referred to new pulmonology provider which he is happy with. Reviewed importance of continuing anticoagulation therapy as instructed to prevent further recurrence. INR today was 3.0.  Goal is 2-3.  Recommend that he continue his current dosing for now and recheck in 4 weeks. Will continue collaboration with pulmonology and vascular for management Follow-up in about 4 weeks for anticoag recheck and to discuss updates after he establishes with new pulmonary provider

## 2023-06-07 NOTE — Progress Notes (Signed)
Your labs are back Your electrolytes, liver function were overall normal at this time.  Your kidney function appears to be decreased but looks to be within your normal range based on previous lab results.  Please make sure that you are staying well-hydrated to prevent further loss of function Your CBC is overall normal Your INR was 3.0.  This is at the upper end of your recommended range but I recommend you continue to take your warfarin as directed.  If you develop any concerning symptoms such as bruising, persistent bleeding please let us know.  For now please follow-up in 4 weeks for recheck as discussed at your appointment

## 2023-06-08 ENCOUNTER — Other Ambulatory Visit: Payer: Self-pay

## 2023-06-14 ENCOUNTER — Emergency Department: Payer: No Typology Code available for payment source

## 2023-06-14 ENCOUNTER — Other Ambulatory Visit: Payer: Self-pay

## 2023-06-14 ENCOUNTER — Ambulatory Visit: Payer: No Typology Code available for payment source | Admitting: Physician Assistant

## 2023-06-14 ENCOUNTER — Inpatient Hospital Stay: Payer: No Typology Code available for payment source

## 2023-06-14 ENCOUNTER — Inpatient Hospital Stay
Admission: EM | Admit: 2023-06-14 | Discharge: 2023-06-23 | DRG: 871 | Disposition: A | Discharge status: Home / Self Care | Payer: No Typology Code available for payment source | Attending: Internal Medicine | Admitting: Internal Medicine

## 2023-06-14 DIAGNOSIS — J45909 Unspecified asthma, uncomplicated: Secondary | ICD-10-CM | POA: Diagnosis present

## 2023-06-14 DIAGNOSIS — E7212 Methylenetetrahydrofolate reductase deficiency: Secondary | ICD-10-CM | POA: Diagnosis present

## 2023-06-14 DIAGNOSIS — I4892 Unspecified atrial flutter: Secondary | ICD-10-CM

## 2023-06-14 DIAGNOSIS — L03119 Cellulitis of unspecified part of limb: Secondary | ICD-10-CM

## 2023-06-14 DIAGNOSIS — Z888 Allergy status to other drugs, medicaments and biological substances status: Secondary | ICD-10-CM

## 2023-06-14 DIAGNOSIS — Z86718 Personal history of other venous thrombosis and embolism: Secondary | ICD-10-CM

## 2023-06-14 DIAGNOSIS — Z86711 Personal history of pulmonary embolism: Secondary | ICD-10-CM | POA: Diagnosis present

## 2023-06-14 DIAGNOSIS — Z833 Family history of diabetes mellitus: Secondary | ICD-10-CM

## 2023-06-14 DIAGNOSIS — L97929 Non-pressure chronic ulcer of unspecified part of left lower leg with unspecified severity: Secondary | ICD-10-CM | POA: Diagnosis present

## 2023-06-14 DIAGNOSIS — I2782 Chronic pulmonary embolism: Secondary | ICD-10-CM | POA: Diagnosis present

## 2023-06-14 DIAGNOSIS — I272 Pulmonary hypertension, unspecified: Secondary | ICD-10-CM | POA: Diagnosis not present

## 2023-06-14 DIAGNOSIS — Z1152 Encounter for screening for COVID-19: Secondary | ICD-10-CM | POA: Diagnosis not present

## 2023-06-14 DIAGNOSIS — N179 Acute kidney failure, unspecified: Secondary | ICD-10-CM | POA: Diagnosis present

## 2023-06-14 DIAGNOSIS — I2724 Chronic thromboembolic pulmonary hypertension: Secondary | ICD-10-CM | POA: Diagnosis present

## 2023-06-14 DIAGNOSIS — I130 Hypertensive heart and chronic kidney disease with heart failure and stage 1 through stage 4 chronic kidney disease, or unspecified chronic kidney disease: Secondary | ICD-10-CM | POA: Diagnosis present

## 2023-06-14 DIAGNOSIS — N1831 Chronic kidney disease, stage 3a: Secondary | ICD-10-CM | POA: Diagnosis present

## 2023-06-14 DIAGNOSIS — E1151 Type 2 diabetes mellitus with diabetic peripheral angiopathy without gangrene: Secondary | ICD-10-CM | POA: Diagnosis present

## 2023-06-14 DIAGNOSIS — I959 Hypotension, unspecified: Secondary | ICD-10-CM | POA: Diagnosis not present

## 2023-06-14 DIAGNOSIS — E11628 Type 2 diabetes mellitus with other skin complications: Secondary | ICD-10-CM | POA: Diagnosis not present

## 2023-06-14 DIAGNOSIS — Z7901 Long term (current) use of anticoagulants: Secondary | ICD-10-CM

## 2023-06-14 DIAGNOSIS — N183 Chronic kidney disease, stage 3 unspecified: Secondary | ICD-10-CM | POA: Diagnosis present

## 2023-06-14 DIAGNOSIS — E1122 Type 2 diabetes mellitus with diabetic chronic kidney disease: Secondary | ICD-10-CM | POA: Diagnosis present

## 2023-06-14 DIAGNOSIS — I483 Typical atrial flutter: Secondary | ICD-10-CM | POA: Diagnosis not present

## 2023-06-14 DIAGNOSIS — R509 Fever, unspecified: Secondary | ICD-10-CM

## 2023-06-14 DIAGNOSIS — I2609 Other pulmonary embolism with acute cor pulmonale: Secondary | ICD-10-CM | POA: Diagnosis not present

## 2023-06-14 DIAGNOSIS — I5082 Biventricular heart failure: Secondary | ICD-10-CM | POA: Diagnosis present

## 2023-06-14 DIAGNOSIS — I5033 Acute on chronic diastolic (congestive) heart failure: Secondary | ICD-10-CM | POA: Diagnosis present

## 2023-06-14 DIAGNOSIS — I878 Other specified disorders of veins: Secondary | ICD-10-CM | POA: Diagnosis present

## 2023-06-14 DIAGNOSIS — I480 Paroxysmal atrial fibrillation: Secondary | ICD-10-CM | POA: Diagnosis present

## 2023-06-14 DIAGNOSIS — Z794 Long term (current) use of insulin: Secondary | ICD-10-CM | POA: Diagnosis not present

## 2023-06-14 DIAGNOSIS — Z95828 Presence of other vascular implants and grafts: Secondary | ICD-10-CM

## 2023-06-14 DIAGNOSIS — A419 Sepsis, unspecified organism: Principal | ICD-10-CM | POA: Diagnosis present

## 2023-06-14 DIAGNOSIS — Z8249 Family history of ischemic heart disease and other diseases of the circulatory system: Secondary | ICD-10-CM

## 2023-06-14 DIAGNOSIS — Z6841 Body Mass Index (BMI) 40.0 and over, adult: Secondary | ICD-10-CM

## 2023-06-14 DIAGNOSIS — Z79899 Other long term (current) drug therapy: Secondary | ICD-10-CM

## 2023-06-14 DIAGNOSIS — L03116 Cellulitis of left lower limb: Secondary | ICD-10-CM | POA: Diagnosis present

## 2023-06-14 DIAGNOSIS — R7989 Other specified abnormal findings of blood chemistry: Secondary | ICD-10-CM | POA: Insufficient documentation

## 2023-06-14 DIAGNOSIS — R6510 Systemic inflammatory response syndrome (SIRS) of non-infectious origin without acute organ dysfunction: Secondary | ICD-10-CM | POA: Diagnosis present

## 2023-06-14 DIAGNOSIS — J9621 Acute and chronic respiratory failure with hypoxia: Principal | ICD-10-CM | POA: Diagnosis present

## 2023-06-14 DIAGNOSIS — I2699 Other pulmonary embolism without acute cor pulmonale: Secondary | ICD-10-CM | POA: Diagnosis not present

## 2023-06-14 DIAGNOSIS — R0902 Hypoxemia: Secondary | ICD-10-CM | POA: Diagnosis not present

## 2023-06-14 DIAGNOSIS — I890 Lymphedema, not elsewhere classified: Secondary | ICD-10-CM | POA: Diagnosis present

## 2023-06-14 DIAGNOSIS — Z7985 Long-term (current) use of injectable non-insulin antidiabetic drugs: Secondary | ICD-10-CM

## 2023-06-14 DIAGNOSIS — R6520 Severe sepsis without septic shock: Secondary | ICD-10-CM | POA: Diagnosis present

## 2023-06-14 DIAGNOSIS — I5023 Acute on chronic systolic (congestive) heart failure: Secondary | ICD-10-CM

## 2023-06-14 DIAGNOSIS — R791 Abnormal coagulation profile: Secondary | ICD-10-CM

## 2023-06-14 DIAGNOSIS — I10 Essential (primary) hypertension: Secondary | ICD-10-CM | POA: Diagnosis present

## 2023-06-14 DIAGNOSIS — Z882 Allergy status to sulfonamides status: Secondary | ICD-10-CM

## 2023-06-14 DIAGNOSIS — Z7984 Long term (current) use of oral hypoglycemic drugs: Secondary | ICD-10-CM

## 2023-06-14 LAB — BASIC METABOLIC PANEL
Anion gap: 10 (ref 5–15)
BUN: 20 mg/dL (ref 6–20)
CO2: 23 mmol/L (ref 22–32)
Calcium: 8.9 mg/dL (ref 8.9–10.3)
Chloride: 102 mmol/L (ref 98–111)
Creatinine, Ser: 1.56 mg/dL — ABNORMAL HIGH (ref 0.61–1.24)
GFR, Estimated: 53 mL/min — ABNORMAL LOW (ref >60)
Glucose, Bld: 145 mg/dL — ABNORMAL HIGH (ref 70–99)
Potassium: 4.4 mmol/L (ref 3.5–5.1)
Sodium: 135 mmol/L (ref 135–145)

## 2023-06-14 LAB — CBC WITH DIFFERENTIAL/PLATELET
Abs Immature Granulocytes: 0.08 10*3/uL — ABNORMAL HIGH (ref 0.00–0.07)
Basophils Absolute: 0 10*3/uL (ref 0.0–0.1)
Basophils Relative: 0 %
Eosinophils Absolute: 0 10*3/uL (ref 0.0–0.5)
Eosinophils Relative: 0 %
HCT: 46.3 % (ref 39.0–52.0)
Hemoglobin: 15 g/dL (ref 13.0–17.0)
Immature Granulocytes: 1 %
Lymphocytes Relative: 8 %
Lymphs Abs: 0.8 10*3/uL (ref 0.7–4.0)
MCH: 30.4 pg (ref 26.0–34.0)
MCHC: 32.4 g/dL (ref 30.0–36.0)
MCV: 93.9 fL (ref 80.0–100.0)
Monocytes Absolute: 0.8 10*3/uL (ref 0.1–1.0)
Monocytes Relative: 7 %
Neutro Abs: 9 10*3/uL — ABNORMAL HIGH (ref 1.7–7.7)
Neutrophils Relative %: 84 %
Platelets: 223 10*3/uL (ref 150–400)
RBC: 4.93 MIL/uL (ref 4.22–5.81)
RDW: 16.6 % — ABNORMAL HIGH (ref 11.5–15.5)
WBC: 10.7 10*3/uL — ABNORMAL HIGH (ref 4.0–10.5)
nRBC: 0 % (ref 0.0–0.2)

## 2023-06-14 LAB — TROPONIN I (HIGH SENSITIVITY)
Troponin I (High Sensitivity): 48 ng/L — ABNORMAL HIGH (ref <18)
Troponin I (High Sensitivity): 53 ng/L — ABNORMAL HIGH (ref <18)

## 2023-06-14 LAB — URINALYSIS, W/ REFLEX TO CULTURE (INFECTION SUSPECTED)
Bilirubin Urine: NEGATIVE
Glucose, UA: NEGATIVE mg/dL
Hgb urine dipstick: NEGATIVE
Ketones, ur: NEGATIVE mg/dL
Leukocytes,Ua: NEGATIVE
Nitrite: NEGATIVE
Protein, ur: >=300 mg/dL — AB
Specific Gravity, Urine: 1.037 — ABNORMAL HIGH (ref 1.005–1.030)
pH: 5 (ref 5.0–8.0)

## 2023-06-14 LAB — RESP PANEL BY RT-PCR (RSV, FLU A&B, COVID)  RVPGX2
Influenza A by PCR: NEGATIVE
Influenza B by PCR: NEGATIVE
Resp Syncytial Virus by PCR: NEGATIVE
SARS Coronavirus 2 by RT PCR: NEGATIVE

## 2023-06-14 LAB — MRSA NEXT GEN BY PCR, NASAL: MRSA by PCR Next Gen: NOT DETECTED

## 2023-06-14 LAB — PROTIME-INR
INR: 1.6 — ABNORMAL HIGH (ref 0.8–1.2)
Prothrombin Time: 18.8 s — ABNORMAL HIGH (ref 11.4–15.2)

## 2023-06-14 LAB — LACTIC ACID, PLASMA
Lactic Acid, Venous: 2.5 mmol/L (ref 0.5–1.9)
Lactic Acid, Venous: 1.7 mmol/L (ref 0.5–1.9)

## 2023-06-14 LAB — BRAIN NATRIURETIC PEPTIDE: B Natriuretic Peptide: 835.3 pg/mL — ABNORMAL HIGH (ref 0.0–100.0)

## 2023-06-14 LAB — GLUCOSE, CAPILLARY
Glucose-Capillary: 122 mg/dL — ABNORMAL HIGH (ref 70–99)
Glucose-Capillary: 135 mg/dL — ABNORMAL HIGH (ref 70–99)

## 2023-06-14 LAB — HEPARIN LEVEL (UNFRACTIONATED)
Heparin Unfractionated: 0.46 [IU]/mL (ref 0.30–0.70)
Heparin Unfractionated: 0.44 [IU]/mL (ref 0.30–0.70)

## 2023-06-14 LAB — SEDIMENTATION RATE: Sed Rate: 10 mm/h (ref 0–20)

## 2023-06-14 MED ORDER — SODIUM CHLORIDE 0.9 % IV SOLN
2.0000 g | INTRAVENOUS | Status: DC
  Administered 2023-06-14 – 2023-06-15 (×2): 2 g via INTRAVENOUS
  Filled 2023-06-14 (×3): qty 20

## 2023-06-14 MED ORDER — ACETAMINOPHEN 500 MG PO TABS
1000.0000 mg | ORAL_TABLET | Freq: Once | ORAL | Status: AC
  Administered 2023-06-14: 1000 mg via ORAL
  Filled 2023-06-14: qty 2

## 2023-06-14 MED ORDER — CHLORHEXIDINE GLUCONATE CLOTH 2 % EX PADS
6.0000 | MEDICATED_PAD | Freq: Every day | CUTANEOUS | Status: DC
  Administered 2023-06-15 – 2023-06-22 (×4): 6 via TOPICAL

## 2023-06-14 MED ORDER — IOHEXOL 350 MG/ML SOLN
75.0000 mL | Freq: Once | INTRAVENOUS | Status: AC | PRN
  Administered 2023-06-14: 75 mL via INTRAVENOUS

## 2023-06-14 MED ORDER — HEPARIN BOLUS VIA INFUSION
7000.0000 [IU] | Freq: Once | INTRAVENOUS | Status: AC
  Administered 2023-06-14: 7000 [IU] via INTRAVENOUS
  Filled 2023-06-14: qty 7000

## 2023-06-14 MED ORDER — VANCOMYCIN HCL IN DEXTROSE 1-5 GM/200ML-% IV SOLN
1000.0000 mg | Freq: Once | INTRAVENOUS | Status: AC
  Administered 2023-06-14: 1000 mg via INTRAVENOUS
  Filled 2023-06-14: qty 200

## 2023-06-14 MED ORDER — INSULIN ASPART 100 UNIT/ML IJ SOLN
0.0000 [IU] | Freq: Three times a day (TID) | INTRAMUSCULAR | Status: DC
  Administered 2023-06-14 – 2023-06-18 (×4): 1 [IU] via SUBCUTANEOUS
  Administered 2023-06-18: 2 [IU] via SUBCUTANEOUS
  Administered 2023-06-19 – 2023-06-23 (×8): 1 [IU] via SUBCUTANEOUS
  Filled 2023-06-14 (×12): qty 1

## 2023-06-14 MED ORDER — VANCOMYCIN HCL 1500 MG/300ML IV SOLN
1500.0000 mg | Freq: Once | INTRAVENOUS | Status: AC
  Administered 2023-06-14: 1500 mg via INTRAVENOUS
  Filled 2023-06-14: qty 300

## 2023-06-14 MED ORDER — VANCOMYCIN HCL 1750 MG/350ML IV SOLN
1750.0000 mg | INTRAVENOUS | Status: DC
  Administered 2023-06-15: 1750 mg via INTRAVENOUS
  Filled 2023-06-14: qty 350

## 2023-06-14 MED ORDER — ORAL CARE MOUTH RINSE: 15.0000 mL | OROMUCOSAL | Status: DC | PRN

## 2023-06-14 MED ORDER — HEPARIN (PORCINE) 25000 UT/250ML-% IV SOLN
2200.0000 [IU]/h | INTRAVENOUS | Status: DC
  Administered 2023-06-14 – 2023-06-16 (×3): 1700 [IU]/h via INTRAVENOUS
  Administered 2023-06-16: 2000 [IU]/h via INTRAVENOUS
  Administered 2023-06-17 – 2023-06-19 (×5): 2200 [IU]/h via INTRAVENOUS
  Filled 2023-06-14 (×10): qty 250

## 2023-06-14 MED ORDER — METRONIDAZOLE 500 MG/100ML IV SOLN
500.0000 mg | Freq: Three times a day (TID) | INTRAVENOUS | Status: DC
  Administered 2023-06-14 – 2023-06-16 (×6): 500 mg via INTRAVENOUS
  Filled 2023-06-14 (×7): qty 100

## 2023-06-14 MED ORDER — FUROSEMIDE 10 MG/ML IJ SOLN
20.0000 mg | Freq: Once | INTRAMUSCULAR | Status: AC
  Administered 2023-06-14: 20 mg via INTRAVENOUS
  Filled 2023-06-14: qty 2

## 2023-06-14 NOTE — Assessment & Plan Note (Signed)
Decompensated respiratory status now requiring 2 to 3 L nasal cannula in the setting of concurrent acute on chronic PE with heart strain, acute on chronic HFpEF, morbid obesity, sepsis secondary to left lower extremity wound Noted baseline 2 L nasal cannula liters transiently used at night Multifactorial in the setting of morbid obesity, PE with heart strain and decompensated heart failure On IV heparin drip with noted subtherapeutic INR IV Lasix IV antibiotics for sepsis Continue to monitor respiratory status Suspect underlying component of OHS given body habitus that will need to be addressed

## 2023-06-14 NOTE — Assessment & Plan Note (Signed)
BP stable Monitor with diuresis and concurrent acute on chronic PE with secondary heart strain

## 2023-06-14 NOTE — Progress Notes (Signed)
Pharmacy Antibiotic Note  Ryan [name] is a 52 y.o. male w/ PMH of CHF, diabetes, HTN, multiple PEs s/p thrombectomy earlier this year on warfarin, CTEPH, 2L at night only  admitted on 06/14/2023 with pulmonary embolus, LLE chronic wound.  Pharmacy has been consulted for vancomycin dosing. He has received 1000 mg IV vancomycin x 1. His serum creatinine is elevated but not far from what appears to be his approximate baseline  Plan: vancomycin 1500 mg IV x 1 (to complete 2500 mg IV loading dose) then 1750 mg IV every 24 hours Goal AUC 400-550. Expected AUC: 479.7 SCr used: 1.56 mg/dL   Height: 5\' 7"  (170.2 cm) Weight: (!) 155.6 kg (343 lb) IBW/kg (Calculated) : 66.1  Temp (24hrs), Avg:100.4 F (38 C), Min:98.7 F (37.1 C), Max:101.5 F (38.6 C)  Recent Labs  Lab 06/14/23 0630 06/14/23 0827 06/14/23 1044  WBC 10.7*  --   --   CREATININE 1.56*  --   --   LATICACIDVEN  --  2.5* 1.7    Estimated Creatinine Clearance: 78.9 mL/min (A) (by C-G formula based on SCr of 1.56 mg/dL (H)).    Allergies  Allergen Reactions   Metrizamide Other (See Comments)    Kidney failure requiring dialysis Kidney failure requiring dialysis    Clindamycin/Lincomycin Rash   Lincomycin Rash   Sulfa Antibiotics Rash    Antimicrobials this admission: 10/15 vancomycin >>  10/15 ceftriaxone >>   Microbiology results: 10/15 BCx: pending 10/15 MRSA PCR: not detected  Thank you for allowing pharmacy to be a part of this patient's care.  Lowella Bandy 06/14/2023 1:03 PM

## 2023-06-14 NOTE — Assessment & Plan Note (Addendum)
Meeting severe sepsis criteria with temperature of 101, respirations in the upper 20s, lactate noted be greater than 2 White count 10.7 Concern for acute on chronic lower extremity wound with new ulcer formation and foul-smelling odor Will place on infectious coverage including Rocephin, Flagyl and vancomycin Panculture Plain film imaging of the left lower extremity Wound care consult ESR and sed rate Hold IV fluids in the setting of concurrent acute on chronic HFpEF with need for gentle diuresis Will otherwise continue to follow closely

## 2023-06-14 NOTE — ED Notes (Signed)
Lab called with critical lactic result. EDP notified.

## 2023-06-14 NOTE — ED Provider Notes (Addendum)
[facility] Provider Note    Event Date/Time   First MD Initiated Contact with Patient 06/14/23 [phone]     (approximate)  History   Chief Complaint: Respiratory Distress  HPI  Ryan [name] is a 52 y.o. male with a past medical history of CHF, diabetes, hypertension, multiple PEs status post thrombectomy earlier this year on warfarin, 2 L at night only, presents to the emergency department for shortness of breath.  According to the patient around 1:00 this morning he began experiencing shortness of breath which has worsened.  Patient normally wears 2 L of oxygen at night only does not require any oxygen during the day while he is awake.  Currently satting 85% on room air placed on 4 L and is satting in the low to mid 90s.  Patient states he has had a slight cough as well as a mild headache also several episodes of diarrhea and vomiting over the last several hours.  Physical Exam   Triage Vital Signs: ED Triage Vitals  Encounter Vitals Group     BP 06/14/23 0630 131/87     Systolic BP Percentile --      Diastolic BP Percentile --      Pulse Rate 06/14/23 0630 93     Resp 06/14/23 0630 (!) 35     Temp 06/14/23 0630 98.7 F (37.1 C)     Temp Source 06/14/23 0630 Oral     SpO2 06/14/23 0630 94 %     Weight 06/14/23 0628 (!) 343 lb (155.6 kg)     Height 06/14/23 0628 5\' 7"  (1.702 m)     Head Circumference --      Peak Flow --      Pain Score 06/14/23 0628 0     Pain Loc --      Pain Education --      Exclude from Growth Chart --     Most recent vital signs: Vitals:   06/14/23 0630 06/14/23 0642  BP: 131/87   Pulse: 93 89  Resp: (!) 35 (!) 25  Temp: 98.7 F (37.1 C)   SpO2: 94% 94%    General: Awake, no distress.  CV:  Good peripheral perfusion.  Regular rate and rhythm  Resp:  Normal effort.  Equal breath sounds bilaterally.  Abd:  No distention.  Soft, nontender.  No rebound or guarding.  ED Results / Procedures / Treatments   EKG  EKG  viewed and interpreted by myself shows a sinus rhythm at 95 bpm with a narrow QRS, normal axis, normal intervals, nonspecific ST changes.  RADIOLOGY  I have reviewed and interpreted the x-ray images.  No obvious significant consolidation on my evaluation. Radiology is read the x-ray as low lung volume with congested appearing vessels.   MEDICATIONS ORDERED IN ED: Medications  iohexol (OMNIPAQUE) 350 MG/ML injection 75 mL (75 mLs Intravenous Contrast Given 06/14/23 0721)     IMPRESSION / MDM / ASSESSMENT AND PLAN / ED COURSE  I reviewed the triage vital signs and the nursing notes.  Patient's presentation is most consistent with acute presentation with potential threat to life or bodily function.  Patient presents to the emergency department for shortness of breath starting around 1:00 last night he also states mild cough headache and chills with vomiting and diarrhea possibly indicating more of a viral infection.  Patient is requiring oxygen at 4 L to maintain sats above 90% when normally does not require any oxygen during the day while  awake, 2 L at night when sleeping only.  Differential is quite broad but would include recurrent PE or increased clot burden, CHF, pneumonia, COVID.  We will check labs including cardiac enzymes we will obtain a CTA of the chest COVID/flu swab and continue to closely monitor.  No distress on 4 L.  Patient's labs have resulted showing reassuring CBC, reassuring chemistry with mild renal insufficiency, troponin of 48 and BNP is elevated 835.  No concerning findings on chest x-ray, CTA is pending.  CTA shows 1 acute to subacute new PE in the left upper lobe the remainder of the PE is chronic.  Patient does have a chronic wound to the left lower extremity does have a foul odor.  Given the patient's fever we will cover with antibiotics until the blood cultures have resulted.  Given his new oxygen requirement/hypoxia we will admit to the hospital service for further  workup and treatment.  CRITICAL CARE Performed by: Minna Antis   Total critical care time: 30 minutes  Critical care time was exclusive of separately billable procedures and treating other patients.  Critical care was necessary to treat or prevent imminent or life-threatening deterioration.  Critical care was time spent personally by me on the following activities: development of treatment plan with patient and/or surrogate as well as nursing, discussions with consultants, evaluation of patient's response to treatment, examination of patient, obtaining history from patient or surrogate, ordering and performing treatments and interventions, ordering and review of laboratory studies, ordering and review of radiographic studies, pulse oximetry and re-evaluation of patient's condition.   FINAL CLINICAL IMPRESSION(S) / ED DIAGNOSES   Hypoxia Shortness of breath Acute PE Cellulitis   Note:  This document was prepared using Dragon voice recognition software and may include unintentional dictation errors.   Minna Antis, MD 06/14/23 [id number]    Minna Antis, MD 06/14/23 [phone]

## 2023-06-14 NOTE — Consult Note (Signed)
Pharmacy Consult Note - Anticoagulation  Pharmacy Consult for heparin Indication: pulmonary embolus (Subtherapeutic INR while on warfarin  PATIENT MEASUREMENTS: Height: 5\' 7"  (170.2 cm) Weight: (!) 155.6 kg (343 lb) IBW/kg (Calculated) : 66.1 HEPARIN DW (KG): 104.5  VITAL SIGNS: Temp: 101.7 F (38.7 C) (10/15 2000) Temp Source: Oral (10/15 2000) BP: 145/98 (10/15 2300) Pulse Rate: 70 (10/15 2300)  Recent Labs    06/14/23 0630 06/14/23 0807 06/14/23 1652 06/14/23 2246  HGB 15.0  --   --   --   HCT 46.3  --   --   --   PLT 223  --   --   --   LABPROT 18.8*  --   --   --   INR 1.6*  --   --   --   HEPARINUNFRC  --   --    < > 0.44  CREATININE 1.56*  --   --   --   TROPONINIHS 48* 53*  --   --    < > = values in this interval not displayed.    Estimated Creatinine Clearance: 78.9 mL/min (A) (by C-G formula based on SCr of 1.56 mg/dL (H)).  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  Regular the workup has not been forthcoming) previously followed by Dr. Isaiah Serge at [facility]   Hepatic steatosis    Hypertension    Morbid obesity with BMI of 50.0-59.9, adult (HCC)    Oxygen deficiency    Peripheral vascular disease (HCC)    Presence of IVC filter    Has 2 IVC filters with significant thrombus burden superior to the filter.   Pulmonary embolus (HCC) 12/2016   Initially just treated with anticoagulation, but in February 2023 in April 2024 treated with EKOS thrombectomy for submassive PE.   Ventricular trigeminy    Weakness of both lower limbs     ASSESSMENT: 52 y.o. male who presents to ED with shortness of  breath requiring 2 liters on nasal cannula oxygen at all times with oxygen saturations of 90% while on oxygen.  PMH includes CHF, diabetes, hypertension, multiple PEs status post thrombectomy earlier this year on warfarin.   No major DDI noted. 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 Limited data with DOACs.     *Per Hx patient intolerant to Xarelto (Gi side-effects) and failed to Elqius - see Hematology note from 03/01/2022*  Pertinent medications: PTA Warfarin 5 mg daily  Goal(s) of therapy: Heparin level 0.3 - 0.7 units/mL INR 2 - 3 Monitor platelets by anticoagulation protocol: Yes   Baseline anticoagulation labs: Recent Labs    06/14/23 0630  INR 1.6*  HGB 15.0  PLT 223   Heparin Date Time aPTT/HL Rate/Comment 10/15 0630   Baseline INR 1.6, start heparin infusion 10/15 1652 HL 0.46 Therapeutic x 1 10/15 2256 -- / 0.44 Therapeutic x 2  PLAN: Continue heparin infusion at 1700 units/hour. Recheck HL daily w/ AM labs while therapeutic Monitor CBC daily while on heparin infusion.  Otelia Sergeant, PharmD, [facility] 06/14/2023 11:43 PM

## 2023-06-14 NOTE — Consult Note (Signed)
VIEW COMPARISON:  05/16/2023 FINDINGS: Low volume chest. Central vascular congestion. Borderline heart size accentuated by technical factors. There is no edema, consolidation, effusion, or pneumothorax. Artifact from EKG leads. IMPRESSION: Low volume chest with congested appearance of central vessels. Electronically Signed   By: Tiburcio Pea M.D.   On: 06/14/2023 07:21   PERIPHERAL VASCULAR CATHETERIZATION  Result Date: 05/17/2023 See surgical note for result.  ECHOCARDIOGRAM COMPLETE  Result Date: 05/17/2023    ECHOCARDIOGRAM REPORT   Patient Name:   Ryan [name] Date of Exam: 05/17/2023 Medical Rec #:  [id number]     Height:       67.0 in  Accession #:    [id number]    Weight:       325.0 lb Date of Birth:  01/02/1971     BSA:          2.486 m Patient Age:    52 years      BP:           107/73 mmHg Patient Gender: M             HR:           73 bpm. Exam Location:  [facility] Procedure: 2D Echo, Cardiac Doppler and Color Doppler Indications:     Pulmonary embolus  History:         Patient has prior history of Echocardiogram examinations, most                  recent 04/08/2022. CHF, Angina, Arrythmias:Atrial Fibrillation,                  Atrial Flutter and PVC, Signs/Symptoms:Fatigue; Risk                  Factors:Hypertension, Diabetes and Dyslipidemia. Pulmonary                  embolus, CKD.  Sonographer:     Mikki Harbor Referring Phys:  [id number] AMY N [facility] Diagnosing Phys: Lorine Bears MD  Sonographer Comments: Technically difficult study due to poor echo windows and patient is obese. IMPRESSIONS  1. Left ventricular ejection fraction, by estimation, is 60 to 65%. The left ventricle has normal function. The left ventricle has no regional wall motion abnormalities. There is mild asymmetric left ventricular hypertrophy of the basal-septal segment. Left ventricular diastolic parameters were normal. There is the interventricular septum is flattened in systole, consistent with right ventricular pressure overload.  2. Right ventricular systolic function is moderately reduced. The right ventricular size is moderately enlarged. Tricuspid regurgitation signal is inadequate for assessing PA pressure.  3. Right atrial size was moderately dilated.  4. The mitral valve is normal in structure. No evidence of mitral valve regurgitation. No evidence of mitral stenosis.  5. The aortic valve is calcified. Aortic valve regurgitation is trivial. Aortic valve sclerosis/calcification is present, without any evidence of aortic stenosis. FINDINGS  Left Ventricle: Left ventricular ejection fraction, by estimation, is 60 to 65%. The left ventricle has normal function. The  left ventricle has no regional wall motion abnormalities. The left ventricular internal cavity size was normal in size. There is  mild asymmetric left ventricular hypertrophy of the basal-septal segment. The interventricular septum is flattened in systole, consistent with right ventricular pressure overload. Left ventricular diastolic parameters were normal. Right Ventricle: The right ventricular size is moderately enlarged. No increase in right ventricular wall thickness. Right ventricular systolic function is moderately reduced. Tricuspid regurgitation signal is inadequate for assessing  Hematology/Oncology Consult note [phone] [phone]      Patient Care Team: [name], NP as PCP - General (Nurse Practitioner) Lamar Blinks, MD as Consulting Physician (Cardiology)   Name of the patient: Ryan [name]  [id number]  Aug 10, 1971   REASON FOR COSULTATION:  PE History of presenting illness-  52 y.o. male with PMH listed at below who presents to ER for evaluation of increased work of breathing. Patient was recently hospitalized in September 2024. 05/16/2023, CT showed large PE in the main right pulmonary artery. Status post mechanical thrombectomy by vascular surgery..  Patient was maintained in the hospital until INR was therapeutic.  Patient reports being compliant with Coumadin.  He takes 5 mg daily. Denies any new medications since last discharge.  He has checked his INR twice and reports both INR readings were therapeutic Patient reports eating healthier lately with more green vegetables, he reports losing 40 pounds of weight since being on Mounjaro.  Patient has chronic lower extremity wound, follows up with wound care. 06/14/2023, CT chest angiogram showed positive for acute on chronic PE.  Large chronic nonocclusive thrombus in the right main pulmonary artery.  Small chronic nonocclusive thrombus in the distal left main pulmonary artery.  New small pulmonary embolus involving the segmental branch artery within the left upper lobe.  Similar findings of chronic right heart strain.  Patient is currently admitted to ICU due to acute on chronic respiratory failure with hypoxia, acute CHF, sepsis due to lower extremity wound..  He uses 2 L nasal cannula at night at home.  Currently respiratory status requiring 2 to 3 L nasal cannula oxygen. Currently on heparin gtt. noted subtherapeutic INR of 1.6. Patient is on IV antibiotics.  Vascular surgery plans no intervention.  Hematology oncology was consulted.  Patient has a longstanding  history of recurrent pulmonary embolism Previous hypercoagulable workup was negative, 02/17/2022  negative factor V Leiden mutation 12/23/2016, hypercoagulable work-up including negative lupus anticoagulant, beta2 glycoprotein antibodies, cardiolipin antibodies, negative prothrombin gene mutation.  03/02/2023 negative beta2 glycoprotein antibodies, Weak positive IgG Anti-Cardiolipin antibodies<40, no lupus anticoagulant. His risk factors include morbid obesity, sedentary lifestyle, chronic lower extremity venous stasis.  He has previously failed Eliquis, Xarelto.  He has been on Coumadin since April 2024.   Allergies  Allergen Reactions   Metrizamide Other (See Comments)    Kidney failure requiring dialysis Kidney failure requiring dialysis    Clindamycin/Lincomycin Rash   Lincomycin Rash   Sulfa Antibiotics Rash    Patient Active Problem List   Diagnosis Date Noted   Pulmonary emboli (HCC) 12/13/2022    Priority: High   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) 02/09/2022    Priority: High   Other neutropenia (HCC) 01/19/2023    Priority: Medium    Lymphadenopathy 02/09/2022    Priority: Medium    Morbid obesity with BMI of 50.0-59.9, adult (HCC) 05/30/2013    Priority: Medium    Acute on chronic HFrEF (heart failure with reduced ejection fraction) (HCC) 06/14/2023   Elevated troponin 06/14/2023   Hypoxia 06/14/2023   PE (pulmonary thromboembolism) (HCC) 05/16/2023   AKI (acute kidney injury) (HCC) 05/16/2023   Cor pulmonale (chronic) (HCC) 05/16/2023   Anticoagulation goal of INR 2 to 3 12/24/2022   Acute on chronic respiratory failure with hypoxia (HCC) 12/13/2022   Cellulitis of right lower leg 04/12/2022   Sepsis (HCC) 04/06/2022   Physical deconditioning 01/27/2022   At risk for sleep apnea 01/27/2022   VTE (  Hematology/Oncology Consult note [phone] [phone]      Patient Care Team: [name], NP as PCP - General (Nurse Practitioner) Lamar Blinks, MD as Consulting Physician (Cardiology)   Name of the patient: Ryan [name]  [id number]  Aug 10, 1971   REASON FOR COSULTATION:  PE History of presenting illness-  52 y.o. male with PMH listed at below who presents to ER for evaluation of increased work of breathing. Patient was recently hospitalized in September 2024. 05/16/2023, CT showed large PE in the main right pulmonary artery. Status post mechanical thrombectomy by vascular surgery..  Patient was maintained in the hospital until INR was therapeutic.  Patient reports being compliant with Coumadin.  He takes 5 mg daily. Denies any new medications since last discharge.  He has checked his INR twice and reports both INR readings were therapeutic Patient reports eating healthier lately with more green vegetables, he reports losing 40 pounds of weight since being on Mounjaro.  Patient has chronic lower extremity wound, follows up with wound care. 06/14/2023, CT chest angiogram showed positive for acute on chronic PE.  Large chronic nonocclusive thrombus in the right main pulmonary artery.  Small chronic nonocclusive thrombus in the distal left main pulmonary artery.  New small pulmonary embolus involving the segmental branch artery within the left upper lobe.  Similar findings of chronic right heart strain.  Patient is currently admitted to ICU due to acute on chronic respiratory failure with hypoxia, acute CHF, sepsis due to lower extremity wound..  He uses 2 L nasal cannula at night at home.  Currently respiratory status requiring 2 to 3 L nasal cannula oxygen. Currently on heparin gtt. noted subtherapeutic INR of 1.6. Patient is on IV antibiotics.  Vascular surgery plans no intervention.  Hematology oncology was consulted.  Patient has a longstanding  history of recurrent pulmonary embolism Previous hypercoagulable workup was negative, 02/17/2022  negative factor V Leiden mutation 12/23/2016, hypercoagulable work-up including negative lupus anticoagulant, beta2 glycoprotein antibodies, cardiolipin antibodies, negative prothrombin gene mutation.  03/02/2023 negative beta2 glycoprotein antibodies, Weak positive IgG Anti-Cardiolipin antibodies<40, no lupus anticoagulant. His risk factors include morbid obesity, sedentary lifestyle, chronic lower extremity venous stasis.  He has previously failed Eliquis, Xarelto.  He has been on Coumadin since April 2024.   Allergies  Allergen Reactions   Metrizamide Other (See Comments)    Kidney failure requiring dialysis Kidney failure requiring dialysis    Clindamycin/Lincomycin Rash   Lincomycin Rash   Sulfa Antibiotics Rash    Patient Active Problem List   Diagnosis Date Noted   Pulmonary emboli (HCC) 12/13/2022    Priority: High   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) 02/09/2022    Priority: High   Other neutropenia (HCC) 01/19/2023    Priority: Medium    Lymphadenopathy 02/09/2022    Priority: Medium    Morbid obesity with BMI of 50.0-59.9, adult (HCC) 05/30/2013    Priority: Medium    Acute on chronic HFrEF (heart failure with reduced ejection fraction) (HCC) 06/14/2023   Elevated troponin 06/14/2023   Hypoxia 06/14/2023   PE (pulmonary thromboembolism) (HCC) 05/16/2023   AKI (acute kidney injury) (HCC) 05/16/2023   Cor pulmonale (chronic) (HCC) 05/16/2023   Anticoagulation goal of INR 2 to 3 12/24/2022   Acute on chronic respiratory failure with hypoxia (HCC) 12/13/2022   Cellulitis of right lower leg 04/12/2022   Sepsis (HCC) 04/06/2022   Physical deconditioning 01/27/2022   At risk for sleep apnea 01/27/2022   VTE (  Hematology/Oncology Consult note [phone] [phone]      Patient Care Team: [name], NP as PCP - General (Nurse Practitioner) Lamar Blinks, MD as Consulting Physician (Cardiology)   Name of the patient: Ryan [name]  [id number]  Aug 10, 1971   REASON FOR COSULTATION:  PE History of presenting illness-  52 y.o. male with PMH listed at below who presents to ER for evaluation of increased work of breathing. Patient was recently hospitalized in September 2024. 05/16/2023, CT showed large PE in the main right pulmonary artery. Status post mechanical thrombectomy by vascular surgery..  Patient was maintained in the hospital until INR was therapeutic.  Patient reports being compliant with Coumadin.  He takes 5 mg daily. Denies any new medications since last discharge.  He has checked his INR twice and reports both INR readings were therapeutic Patient reports eating healthier lately with more green vegetables, he reports losing 40 pounds of weight since being on Mounjaro.  Patient has chronic lower extremity wound, follows up with wound care. 06/14/2023, CT chest angiogram showed positive for acute on chronic PE.  Large chronic nonocclusive thrombus in the right main pulmonary artery.  Small chronic nonocclusive thrombus in the distal left main pulmonary artery.  New small pulmonary embolus involving the segmental branch artery within the left upper lobe.  Similar findings of chronic right heart strain.  Patient is currently admitted to ICU due to acute on chronic respiratory failure with hypoxia, acute CHF, sepsis due to lower extremity wound..  He uses 2 L nasal cannula at night at home.  Currently respiratory status requiring 2 to 3 L nasal cannula oxygen. Currently on heparin gtt. noted subtherapeutic INR of 1.6. Patient is on IV antibiotics.  Vascular surgery plans no intervention.  Hematology oncology was consulted.  Patient has a longstanding  history of recurrent pulmonary embolism Previous hypercoagulable workup was negative, 02/17/2022  negative factor V Leiden mutation 12/23/2016, hypercoagulable work-up including negative lupus anticoagulant, beta2 glycoprotein antibodies, cardiolipin antibodies, negative prothrombin gene mutation.  03/02/2023 negative beta2 glycoprotein antibodies, Weak positive IgG Anti-Cardiolipin antibodies<40, no lupus anticoagulant. His risk factors include morbid obesity, sedentary lifestyle, chronic lower extremity venous stasis.  He has previously failed Eliquis, Xarelto.  He has been on Coumadin since April 2024.   Allergies  Allergen Reactions   Metrizamide Other (See Comments)    Kidney failure requiring dialysis Kidney failure requiring dialysis    Clindamycin/Lincomycin Rash   Lincomycin Rash   Sulfa Antibiotics Rash    Patient Active Problem List   Diagnosis Date Noted   Pulmonary emboli (HCC) 12/13/2022    Priority: High   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) 02/09/2022    Priority: High   Other neutropenia (HCC) 01/19/2023    Priority: Medium    Lymphadenopathy 02/09/2022    Priority: Medium    Morbid obesity with BMI of 50.0-59.9, adult (HCC) 05/30/2013    Priority: Medium    Acute on chronic HFrEF (heart failure with reduced ejection fraction) (HCC) 06/14/2023   Elevated troponin 06/14/2023   Hypoxia 06/14/2023   PE (pulmonary thromboembolism) (HCC) 05/16/2023   AKI (acute kidney injury) (HCC) 05/16/2023   Cor pulmonale (chronic) (HCC) 05/16/2023   Anticoagulation goal of INR 2 to 3 12/24/2022   Acute on chronic respiratory failure with hypoxia (HCC) 12/13/2022   Cellulitis of right lower leg 04/12/2022   Sepsis (HCC) 04/06/2022   Physical deconditioning 01/27/2022   At risk for sleep apnea 01/27/2022   VTE (  VIEW COMPARISON:  05/16/2023 FINDINGS: Low volume chest. Central vascular congestion. Borderline heart size accentuated by technical factors. There is no edema, consolidation, effusion, or pneumothorax. Artifact from EKG leads. IMPRESSION: Low volume chest with congested appearance of central vessels. Electronically Signed   By: Tiburcio Pea M.D.   On: 06/14/2023 07:21   PERIPHERAL VASCULAR CATHETERIZATION  Result Date: 05/17/2023 See surgical note for result.  ECHOCARDIOGRAM COMPLETE  Result Date: 05/17/2023    ECHOCARDIOGRAM REPORT   Patient Name:   Ryan [name] Date of Exam: 05/17/2023 Medical Rec #:  [id number]     Height:       67.0 in  Accession #:    [id number]    Weight:       325.0 lb Date of Birth:  01/02/1971     BSA:          2.486 m Patient Age:    52 years      BP:           107/73 mmHg Patient Gender: M             HR:           73 bpm. Exam Location:  [facility] Procedure: 2D Echo, Cardiac Doppler and Color Doppler Indications:     Pulmonary embolus  History:         Patient has prior history of Echocardiogram examinations, most                  recent 04/08/2022. CHF, Angina, Arrythmias:Atrial Fibrillation,                  Atrial Flutter and PVC, Signs/Symptoms:Fatigue; Risk                  Factors:Hypertension, Diabetes and Dyslipidemia. Pulmonary                  embolus, CKD.  Sonographer:     Mikki Harbor Referring Phys:  [id number] AMY N [facility] Diagnosing Phys: Lorine Bears MD  Sonographer Comments: Technically difficult study due to poor echo windows and patient is obese. IMPRESSIONS  1. Left ventricular ejection fraction, by estimation, is 60 to 65%. The left ventricle has normal function. The left ventricle has no regional wall motion abnormalities. There is mild asymmetric left ventricular hypertrophy of the basal-septal segment. Left ventricular diastolic parameters were normal. There is the interventricular septum is flattened in systole, consistent with right ventricular pressure overload.  2. Right ventricular systolic function is moderately reduced. The right ventricular size is moderately enlarged. Tricuspid regurgitation signal is inadequate for assessing PA pressure.  3. Right atrial size was moderately dilated.  4. The mitral valve is normal in structure. No evidence of mitral valve regurgitation. No evidence of mitral stenosis.  5. The aortic valve is calcified. Aortic valve regurgitation is trivial. Aortic valve sclerosis/calcification is present, without any evidence of aortic stenosis. FINDINGS  Left Ventricle: Left ventricular ejection fraction, by estimation, is 60 to 65%. The left ventricle has normal function. The  left ventricle has no regional wall motion abnormalities. The left ventricular internal cavity size was normal in size. There is  mild asymmetric left ventricular hypertrophy of the basal-septal segment. The interventricular septum is flattened in systole, consistent with right ventricular pressure overload. Left ventricular diastolic parameters were normal. Right Ventricle: The right ventricular size is moderately enlarged. No increase in right ventricular wall thickness. Right ventricular systolic function is moderately reduced. Tricuspid regurgitation signal is inadequate for assessing  Hematology/Oncology Consult note [phone] [phone]      Patient Care Team: [name], NP as PCP - General (Nurse Practitioner) Lamar Blinks, MD as Consulting Physician (Cardiology)   Name of the patient: Ryan [name]  [id number]  Aug 10, 1971   REASON FOR COSULTATION:  PE History of presenting illness-  52 y.o. male with PMH listed at below who presents to ER for evaluation of increased work of breathing. Patient was recently hospitalized in September 2024. 05/16/2023, CT showed large PE in the main right pulmonary artery. Status post mechanical thrombectomy by vascular surgery..  Patient was maintained in the hospital until INR was therapeutic.  Patient reports being compliant with Coumadin.  He takes 5 mg daily. Denies any new medications since last discharge.  He has checked his INR twice and reports both INR readings were therapeutic Patient reports eating healthier lately with more green vegetables, he reports losing 40 pounds of weight since being on Mounjaro.  Patient has chronic lower extremity wound, follows up with wound care. 06/14/2023, CT chest angiogram showed positive for acute on chronic PE.  Large chronic nonocclusive thrombus in the right main pulmonary artery.  Small chronic nonocclusive thrombus in the distal left main pulmonary artery.  New small pulmonary embolus involving the segmental branch artery within the left upper lobe.  Similar findings of chronic right heart strain.  Patient is currently admitted to ICU due to acute on chronic respiratory failure with hypoxia, acute CHF, sepsis due to lower extremity wound..  He uses 2 L nasal cannula at night at home.  Currently respiratory status requiring 2 to 3 L nasal cannula oxygen. Currently on heparin gtt. noted subtherapeutic INR of 1.6. Patient is on IV antibiotics.  Vascular surgery plans no intervention.  Hematology oncology was consulted.  Patient has a longstanding  history of recurrent pulmonary embolism Previous hypercoagulable workup was negative, 02/17/2022  negative factor V Leiden mutation 12/23/2016, hypercoagulable work-up including negative lupus anticoagulant, beta2 glycoprotein antibodies, cardiolipin antibodies, negative prothrombin gene mutation.  03/02/2023 negative beta2 glycoprotein antibodies, Weak positive IgG Anti-Cardiolipin antibodies<40, no lupus anticoagulant. His risk factors include morbid obesity, sedentary lifestyle, chronic lower extremity venous stasis.  He has previously failed Eliquis, Xarelto.  He has been on Coumadin since April 2024.   Allergies  Allergen Reactions   Metrizamide Other (See Comments)    Kidney failure requiring dialysis Kidney failure requiring dialysis    Clindamycin/Lincomycin Rash   Lincomycin Rash   Sulfa Antibiotics Rash    Patient Active Problem List   Diagnosis Date Noted   Pulmonary emboli (HCC) 12/13/2022    Priority: High   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) 02/09/2022    Priority: High   Other neutropenia (HCC) 01/19/2023    Priority: Medium    Lymphadenopathy 02/09/2022    Priority: Medium    Morbid obesity with BMI of 50.0-59.9, adult (HCC) 05/30/2013    Priority: Medium    Acute on chronic HFrEF (heart failure with reduced ejection fraction) (HCC) 06/14/2023   Elevated troponin 06/14/2023   Hypoxia 06/14/2023   PE (pulmonary thromboembolism) (HCC) 05/16/2023   AKI (acute kidney injury) (HCC) 05/16/2023   Cor pulmonale (chronic) (HCC) 05/16/2023   Anticoagulation goal of INR 2 to 3 12/24/2022   Acute on chronic respiratory failure with hypoxia (HCC) 12/13/2022   Cellulitis of right lower leg 04/12/2022   Sepsis (HCC) 04/06/2022   Physical deconditioning 01/27/2022   At risk for sleep apnea 01/27/2022   VTE (  VIEW COMPARISON:  05/16/2023 FINDINGS: Low volume chest. Central vascular congestion. Borderline heart size accentuated by technical factors. There is no edema, consolidation, effusion, or pneumothorax. Artifact from EKG leads. IMPRESSION: Low volume chest with congested appearance of central vessels. Electronically Signed   By: Tiburcio Pea M.D.   On: 06/14/2023 07:21   PERIPHERAL VASCULAR CATHETERIZATION  Result Date: 05/17/2023 See surgical note for result.  ECHOCARDIOGRAM COMPLETE  Result Date: 05/17/2023    ECHOCARDIOGRAM REPORT   Patient Name:   Ryan [name] Date of Exam: 05/17/2023 Medical Rec #:  [id number]     Height:       67.0 in  Accession #:    [id number]    Weight:       325.0 lb Date of Birth:  01/02/1971     BSA:          2.486 m Patient Age:    52 years      BP:           107/73 mmHg Patient Gender: M             HR:           73 bpm. Exam Location:  [facility] Procedure: 2D Echo, Cardiac Doppler and Color Doppler Indications:     Pulmonary embolus  History:         Patient has prior history of Echocardiogram examinations, most                  recent 04/08/2022. CHF, Angina, Arrythmias:Atrial Fibrillation,                  Atrial Flutter and PVC, Signs/Symptoms:Fatigue; Risk                  Factors:Hypertension, Diabetes and Dyslipidemia. Pulmonary                  embolus, CKD.  Sonographer:     Mikki Harbor Referring Phys:  [id number] AMY N [facility] Diagnosing Phys: Lorine Bears MD  Sonographer Comments: Technically difficult study due to poor echo windows and patient is obese. IMPRESSIONS  1. Left ventricular ejection fraction, by estimation, is 60 to 65%. The left ventricle has normal function. The left ventricle has no regional wall motion abnormalities. There is mild asymmetric left ventricular hypertrophy of the basal-septal segment. Left ventricular diastolic parameters were normal. There is the interventricular septum is flattened in systole, consistent with right ventricular pressure overload.  2. Right ventricular systolic function is moderately reduced. The right ventricular size is moderately enlarged. Tricuspid regurgitation signal is inadequate for assessing PA pressure.  3. Right atrial size was moderately dilated.  4. The mitral valve is normal in structure. No evidence of mitral valve regurgitation. No evidence of mitral stenosis.  5. The aortic valve is calcified. Aortic valve regurgitation is trivial. Aortic valve sclerosis/calcification is present, without any evidence of aortic stenosis. FINDINGS  Left Ventricle: Left ventricular ejection fraction, by estimation, is 60 to 65%. The left ventricle has normal function. The  left ventricle has no regional wall motion abnormalities. The left ventricular internal cavity size was normal in size. There is  mild asymmetric left ventricular hypertrophy of the basal-septal segment. The interventricular septum is flattened in systole, consistent with right ventricular pressure overload. Left ventricular diastolic parameters were normal. Right Ventricle: The right ventricular size is moderately enlarged. No increase in right ventricular wall thickness. Right ventricular systolic function is moderately reduced. Tricuspid regurgitation signal is inadequate for assessing  VIEW COMPARISON:  05/16/2023 FINDINGS: Low volume chest. Central vascular congestion. Borderline heart size accentuated by technical factors. There is no edema, consolidation, effusion, or pneumothorax. Artifact from EKG leads. IMPRESSION: Low volume chest with congested appearance of central vessels. Electronically Signed   By: Tiburcio Pea M.D.   On: 06/14/2023 07:21   PERIPHERAL VASCULAR CATHETERIZATION  Result Date: 05/17/2023 See surgical note for result.  ECHOCARDIOGRAM COMPLETE  Result Date: 05/17/2023    ECHOCARDIOGRAM REPORT   Patient Name:   Ryan [name] Date of Exam: 05/17/2023 Medical Rec #:  [id number]     Height:       67.0 in  Accession #:    [id number]    Weight:       325.0 lb Date of Birth:  01/02/1971     BSA:          2.486 m Patient Age:    52 years      BP:           107/73 mmHg Patient Gender: M             HR:           73 bpm. Exam Location:  [facility] Procedure: 2D Echo, Cardiac Doppler and Color Doppler Indications:     Pulmonary embolus  History:         Patient has prior history of Echocardiogram examinations, most                  recent 04/08/2022. CHF, Angina, Arrythmias:Atrial Fibrillation,                  Atrial Flutter and PVC, Signs/Symptoms:Fatigue; Risk                  Factors:Hypertension, Diabetes and Dyslipidemia. Pulmonary                  embolus, CKD.  Sonographer:     Mikki Harbor Referring Phys:  [id number] AMY N COX Diagnosing Phys: Lorine Bears MD  Sonographer Comments: Technically difficult study due to poor echo windows and patient is obese. IMPRESSIONS  1. Left ventricular ejection fraction, by estimation, is 60 to 65%. The left ventricle has normal function. The left ventricle has no regional wall motion abnormalities. There is mild asymmetric left ventricular hypertrophy of the basal-septal segment. Left ventricular diastolic parameters were normal. There is the interventricular septum is flattened in systole, consistent with right ventricular pressure overload.  2. Right ventricular systolic function is moderately reduced. The right ventricular size is moderately enlarged. Tricuspid regurgitation signal is inadequate for assessing PA pressure.  3. Right atrial size was moderately dilated.  4. The mitral valve is normal in structure. No evidence of mitral valve regurgitation. No evidence of mitral stenosis.  5. The aortic valve is calcified. Aortic valve regurgitation is trivial. Aortic valve sclerosis/calcification is present, without any evidence of aortic stenosis. FINDINGS  Left Ventricle: Left ventricular ejection fraction, by estimation, is 60 to 65%. The left ventricle has normal function. The  left ventricle has no regional wall motion abnormalities. The left ventricular internal cavity size was normal in size. There is  mild asymmetric left ventricular hypertrophy of the basal-septal segment. The interventricular septum is flattened in systole, consistent with right ventricular pressure overload. Left ventricular diastolic parameters were normal. Right Ventricle: The right ventricular size is moderately enlarged. No increase in right ventricular wall thickness. Right ventricular systolic function is moderately reduced. Tricuspid regurgitation signal is inadequate for assessing  Hematology/Oncology Consult note [phone] [phone]      Patient Care Team: [name], NP as PCP - General (Nurse Practitioner) Lamar Blinks, MD as Consulting Physician (Cardiology)   Name of the patient: Ryan [name]  [id number]  Aug 10, 1971   REASON FOR COSULTATION:  PE History of presenting illness-  52 y.o. male with PMH listed at below who presents to ER for evaluation of increased work of breathing. Patient was recently hospitalized in September 2024. 05/16/2023, CT showed large PE in the main right pulmonary artery. Status post mechanical thrombectomy by vascular surgery..  Patient was maintained in the hospital until INR was therapeutic.  Patient reports being compliant with Coumadin.  He takes 5 mg daily. Denies any new medications since last discharge.  He has checked his INR twice and reports both INR readings were therapeutic Patient reports eating healthier lately with more green vegetables, he reports losing 40 pounds of weight since being on Mounjaro.  Patient has chronic lower extremity wound, follows up with wound care. 06/14/2023, CT chest angiogram showed positive for acute on chronic PE.  Large chronic nonocclusive thrombus in the right main pulmonary artery.  Small chronic nonocclusive thrombus in the distal left main pulmonary artery.  New small pulmonary embolus involving the segmental branch artery within the left upper lobe.  Similar findings of chronic right heart strain.  Patient is currently admitted to ICU due to acute on chronic respiratory failure with hypoxia, acute CHF, sepsis due to lower extremity wound..  He uses 2 L nasal cannula at night at home.  Currently respiratory status requiring 2 to 3 L nasal cannula oxygen. Currently on heparin gtt. noted subtherapeutic INR of 1.6. Patient is on IV antibiotics.  Vascular surgery plans no intervention.  Hematology oncology was consulted.  Patient has a longstanding  history of recurrent pulmonary embolism Previous hypercoagulable workup was negative, 02/17/2022  negative factor V Leiden mutation 12/23/2016, hypercoagulable work-up including negative lupus anticoagulant, beta2 glycoprotein antibodies, cardiolipin antibodies, negative prothrombin gene mutation.  03/02/2023 negative beta2 glycoprotein antibodies, Weak positive IgG Anti-Cardiolipin antibodies<40, no lupus anticoagulant. His risk factors include morbid obesity, sedentary lifestyle, chronic lower extremity venous stasis.  He has previously failed Eliquis, Xarelto.  He has been on Coumadin since April 2024.   Allergies  Allergen Reactions   Metrizamide Other (See Comments)    Kidney failure requiring dialysis Kidney failure requiring dialysis    Clindamycin/Lincomycin Rash   Lincomycin Rash   Sulfa Antibiotics Rash    Patient Active Problem List   Diagnosis Date Noted   Pulmonary emboli (HCC) 12/13/2022    Priority: High   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) 02/09/2022    Priority: High   Other neutropenia (HCC) 01/19/2023    Priority: Medium    Lymphadenopathy 02/09/2022    Priority: Medium    Morbid obesity with BMI of 50.0-59.9, adult (HCC) 05/30/2013    Priority: Medium    Acute on chronic HFrEF (heart failure with reduced ejection fraction) (HCC) 06/14/2023   Elevated troponin 06/14/2023   Hypoxia 06/14/2023   PE (pulmonary thromboembolism) (HCC) 05/16/2023   AKI (acute kidney injury) (HCC) 05/16/2023   Cor pulmonale (chronic) (HCC) 05/16/2023   Anticoagulation goal of INR 2 to 3 12/24/2022   Acute on chronic respiratory failure with hypoxia (HCC) 12/13/2022   Cellulitis of right lower leg 04/12/2022   Sepsis (HCC) 04/06/2022   Physical deconditioning 01/27/2022   At risk for sleep apnea 01/27/2022   VTE (

## 2023-06-14 NOTE — ED Provider Notes (Signed)
Emergency Medicine Provider Triage Evaluation Note  Ryan [name] , a 52 y.o. male  was evaluated in triage.  Pt complains of shortness of breath, lightheadedness.  Review of Systems  Positive: Shortness of breath, lightheadedness, chills, nonproductive cough Negative: Fever, chest pain  Physical Exam  BP 131/87   Pulse 93   Temp 98.7 F (37.1 C) (Oral)   Resp (!) 35   Ht 5\' 7"  (1.702 m)   Wt (!) 155.6 kg   SpO2 94%   BMI 53.72 kg/m  Gen:   Awake, alert Resp:  Tachypneic, diminished in the left lung, no wheezing or rhonchi, on 4 L nasal cannula MSK:   Chronic lower extremity edema Other:  No tachycardia  Medical Decision Making  Medically screening exam initiated at 6:31 AM.  Appropriate orders placed.  [name] was informed that the remainder of the evaluation will be completed by another provider, this initial triage assessment does not replace that evaluation, and the importance of remaining in the ED until their evaluation is complete.  Patient here with history of PE requiring mechanical thrombectomy last on 05/17/2023, chronically on Coumadin who presents to the emergency department with shortness of breath and lightheadedness feeling like he was going to pass out that started tonight around 1 AM.  No chest pain.  Reports chills but no fever.  Nonproductive cough.  Chronic lower extremity swelling.  States history of seasonal asthma.  No history of CHF per his report except for related to PE however patient is on furosemide and Entresto.  Reports compliance with his Coumadin.  Wears 2 L of oxygen at night.  Sats 85% on room air at rest per EMS.  Currently satting 95% on 4 L nasal cannula.  Tachypneic and speaking short sentences but not in significant distress.  Labs, CTA of the chest, chest x-ray ordered.  Patient stable for oncoming ED provider.   [name], [name], DO 06/14/23 [phone]

## 2023-06-14 NOTE — Plan of Care (Signed)
  Problem: Education: Goal: Knowledge of General Education information will improve Description Including pain rating scale, medication(s)/side effects and non-pharmacologic comfort measures Outcome: Progressing   Problem: Health Behavior/Discharge Planning: Goal: Ability to manage health-related needs will improve Outcome: Progressing   

## 2023-06-14 NOTE — ED Triage Notes (Signed)
Pt BIB EMS from home for respiratory distress. Pt states SHOB onset 0100. Pt states hx of P.E's, removed 15 clots 1 month ago. Pt is on warfarin. Pt was 85%on RA, increased to 94% on 4LPM O2 via [place].

## 2023-06-14 NOTE — Consult Note (Signed)
Pharmacy Consult Note - Anticoagulation  Pharmacy Consult for heparin Indication: pulmonary embolus (Subtherapeutic INR while on warfarin  PATIENT MEASUREMENTS: Height: 5\' 7"  (170.2 cm) Weight: (!) 155.6 kg (343 lb) IBW/kg (Calculated) : 66.1 HEPARIN DW (KG): 104.5  VITAL SIGNS: Temp: 101.5 F (38.6 C) (10/15 0811) Temp Source: Oral (10/15 0811) BP: 115/80 (10/15 0837) Pulse Rate: 93 (10/15 0837)  Recent Labs    06/14/23 0630 06/14/23 0807  HGB 15.0  --   HCT 46.3  --   PLT 223  --   LABPROT 18.8*  --   INR 1.6*  --   CREATININE 1.56*  --   TROPONINIHS 48* 53*    Estimated Creatinine Clearance: 78.9 mL/min (A) (by C-G formula based on SCr of 1.56 mg/dL (H)).  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  Regular the workup has not been forthcoming) previously followed by Dr[facility] at [facility]   Hepatic steatosis    Hypertension    Morbid obesity with BMI of 50.0-59.9, adult (HCC)    Oxygen deficiency    Peripheral vascular disease (HCC)    Presence of IVC filter    Has 2 IVC filters with significant thrombus burden superior to the filter.   Pulmonary embolus (HCC) 12/2016   Initially just treated with anticoagulation, but in February 2023 in April 2024 treated with EKOS thrombectomy for submassive PE.   Ventricular trigeminy    Weakness of both lower limbs     ASSESSMENT: 52 y.o. male who presents to ED with shortness of breath requiring 2 liters on nasal cannula oxygen at all times with oxygen saturations of 90% while on oxygen.  PMH includes CHF, diabetes, hypertension, multiple PEs status post  thrombectomy earlier this year on warfarin.   No major DDI noted. 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 Limited data with DOACs.     *Per Hx patient intolerant to Xarelto (Gi side-effects) and failed to Elqius - see Hematology note from 03/01/2022*  Pertinent medications: PTA Warfarin 5 mg daily  Goal(s) of therapy: Heparin level 0.3 - 0.7 units/mL INR 2 - 3 Monitor platelets by anticoagulation protocol: Yes   Baseline anticoagulation labs: Recent Labs    06/14/23 0630  INR 1.6*  HGB 15.0  PLT 223   Heparin Date Time aPTT/HL Rate/Comment 10/15 0630   Baseline INR 1.6, start heparin infusion   PLAN: Give 7000 units bolus x1; then start heparin infusion at 1700 units/hour. Check heparin level in 6 hours, then daily once at least two levels are consecutively therapeutic. Monitor CBC daily while on heparin infusion.  Ewin Rehberg Rodriguez-Guzman PharmD, BCPS 06/14/2023 9:27 AM

## 2023-06-14 NOTE — H&P (Signed)
History and Physical    Patient: Ryan [name] VWU:[id number] DOB: 1971/03/11 DOA: 06/14/2023 DOS: the patient was seen and examined on 06/14/2023 PCP: [name], NP  Patient coming from: Home  Chief Complaint:  Chief Complaint  Patient presents with   Respiratory Distress   HPI: Ryan [name] is a 52 y.o. 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  Patient reports increased work of breathing over the past 1 to 2 days.  Positive orthopnea, PND.  Noted admission September 16 through September 25 for similar issues.  Had mechanical thrombectomy with vascular surgery on September 17.  Was maintained in the hospital until INR is therapeutic with a goal INR of 3.  Per patient, he has been compliant with Coumadin.  Denies any high salt intake or NSAID use.  Subjective malaise and fatigue.  Also with lower extremity wound that has been followed by wound care.  Denies any known trauma.  Has new ulcer formation.  Patient unclear of cause.  No reported purulent drainage.  Baseline type 2 diabetes. Presented to the ER Tmax of one 1.5, heart rate 70s to 90s, respiratory rate 20s to 30s, BP stable.  Transition to 2 to 4 L nasal cannula to keep O2 sats greater than 95%. Review of Systems: As mentioned in the history of present illness. All other systems reviewed and are negative. Past Medical History:  Diagnosis Date   (HFpEF) heart failure with preserved ejection fraction (HCC)    Arrhythmia    atrial fibrillation   CHF (congestive heart failure) (HCC)    Diabetes mellitus type II, non insulin dependent (HCC)    Diabetes mellitus without complication (HCC)    per pt-pre diabetic-Dr Dario Guardian stated said pt   DVT (deep venous thrombosis) (HCC)    Over 18 DVT  episodes.  Regular the workup has not been forthcoming) previously followed by Dr. Isaiah Serge at [facility]   Hepatic steatosis    Hypertension    Morbid obesity with BMI of 50.0-59.9, adult (HCC)    Oxygen deficiency    Peripheral vascular disease (HCC)    Presence of IVC filter    Has 2 IVC filters with significant thrombus burden superior to the filter.   Pulmonary embolus (HCC) 12/2016   Initially just treated with anticoagulation, but in February 2023 in April 2024 treated with EKOS thrombectomy for submassive PE.   Ventricular trigeminy    Weakness of both lower limbs    Past Surgical History:  Procedure Laterality Date   CARDIAC ELECTROPHYSIOLOGY STUDY AND ABLATION  10/2020   PERIPHERAL VASCULAR CATHETERIZATION N/A 01/10/2015   Procedure: Dialysis/Perma Catheter Insertion;  Surgeon: Renford Dills, MD;  Location: [facility] INVASIVE CV LAB;  Service: Cardiovascular;  Laterality: N/A;   PERIPHERAL VASCULAR CATHETERIZATION N/A 02/24/2015   Procedure: Dialysis/Perma Catheter Removal;  Surgeon: Annice Needy, MD;  Location: [facility] INVASIVE CV LAB;  Service: Cardiovascular;  Laterality: N/A;   PULMONARY THROMBECTOMY N/A 12/13/2022   Procedure: PULMONARY THROMBECTOMY;  Surgeon: Annice Needy, MD;  Location: [facility] INVASIVE CV LAB;  Service: Cardiovascular;  Laterality: N/A;   PULMONARY THROMBECTOMY N/A 05/17/2023   Procedure: PULMONARY THROMBECTOMY;  Surgeon: Renford Dills, MD;  Location: [facility] INVASIVE CV LAB;  Service: Cardiovascular;  Laterality: N/A;   Social History:  reports that  he has never smoked. He has never used smokeless tobacco. He reports that he does not drink alcohol and does not use drugs.  Allergies  Allergen Reactions   Metrizamide Other (See Comments)    Kidney failure requiring dialysis Kidney failure requiring dialysis    Clindamycin/Lincomycin Rash   Lincomycin Rash   Sulfa Antibiotics Rash    Family History  Problem Relation Age of Onset   Hypertension Mother    Cancer  Mother    Breast cancer Mother    Prostate cancer Father    Hypertension Father    Diabetes Father    Heart disease Maternal Grandmother    Pulmonary embolism Paternal Grandfather    Pulmonary embolism Paternal Uncle    Pulmonary embolism Cousin    Deep vein thrombosis Cousin    Pulmonary embolism Paternal Aunt     Prior to Admission medications   Medication Sig Start Date End Date Taking? Authorizing Provider  albuterol (VENTOLIN HFA) 108 (90 Base) MCG/ACT inhaler Inhale 2 puffs into the lungs every 6 (six) hours as needed for wheezing or shortness of breath. 02/23/22  Yes Sunnie Nielsen, DO  furosemide (LASIX) 40 MG tablet Take 1 tablet (40 mg total) by mouth every other day. 04/21/23  Yes Mecum, Erin E, PA-C  metoprolol succinate (TOPROL-XL) 25 MG 24 hr tablet Take 1 tablet (25 mg total) by mouth daily. 02/23/22  Yes Sunnie Nielsen, DO  sacubitril-valsartan (ENTRESTO) 24-26 MG Take 1 tablet by mouth 2 (two) times daily. 10/28/22  Yes Hackney, Tina A, FNP  tirzepatide [facility]) 5 MG/0.5ML Pen Inject 5 mg into the skin once a week. 06/06/23  Yes [name], NP  warfarin (COUMADIN) 5 MG tablet Take 1 tablet (5 mg total) by mouth daily. 06/06/23  Yes [name], NP  OXYGEN Inhale 1 L into the lungs daily.    [provider]    Physical Exam: Vitals:   06/14/23 0900 06/14/23 0930 06/14/23 1000 06/14/23 1100  BP: 121/76 122/82 106/69 121/80  Pulse: 88 79 86 76  Resp: 20 (!) 26 19 (!) 28  Temp:   (!) 101.1 F (38.4 C)   TempSrc:   Oral   SpO2: 97% 95% 96% 95%  Weight:      Height:       Physical Exam Constitutional:      Appearance: He is obese.  HENT:     Head: Normocephalic and atraumatic.     Nose: Nose normal.     Mouth/Throat:     Mouth: Mucous membranes are moist.  Cardiovascular:     Rate and Rhythm: Regular rhythm. Tachycardia present.  Pulmonary:     Effort: Pulmonary effort is normal.  Abdominal:     General: Bowel sounds are normal.      Comments: Obese abdomen    Skin:    Comments: See picture   Neurological:     General: No focal deficit present.  Psychiatric:        Mood and Affect: Mood normal.     Data Reviewed:  There are no new results to review at this time.  CT Angio Chest PE W and/or Wo Contrast CLINICAL DATA:  Shortness of breath. History of chronic pulmonary emboli.  EXAM: CT ANGIOGRAPHY CHEST WITH CONTRAST  TECHNIQUE: Multidetector CT imaging of the chest was performed using the standard protocol during bolus administration of intravenous contrast. Multiplanar CT image reconstructions and MIPs were obtained to evaluate the vascular anatomy.  RADIATION DOSE REDUCTION: This exam was performed  according to the departmental dose-optimization program which includes automated exposure control, adjustment of the mA and/or kV according to patient size and/or use of iterative reconstruction technique.  CONTRAST:  75mL OMNIPAQUE IOHEXOL 350 MG/ML SOLN  COMPARISON:  05/16/2023  FINDINGS: Cardiovascular: Large chronic nonocclusive thrombus in the right main pulmonary artery. Small amount of nonocclusive thrombus extends into the lobar branches of the right upper and right lower lobes. Chronic peripheral nonocclusive thrombus in the distal left main pulmonary artery extending into the left lower lobe lobar branch pulmonary artery. Small thrombus involving a segmental branch artery within the left upper lobe anteriorly is new from prior (series 5, image 93). Similar findings of chronic right heart strain with elevated RV to LV ratio of approximately 1.9. Persistently dilated pulmonary trunk measuring 3.7 cm. Heart size is normal. No pericardial effusion. Thoracic aorta is nonaneurysmal.  Mediastinum/Nodes: No enlarged mediastinal, hilar, or axillary lymph nodes. Thyroid gland, trachea, and esophagus demonstrate no significant findings.  Lungs/Pleura: No focal airspace consolidation. No  evidence of pulmonary infarction. No pleural effusion or pneumothorax.  Upper Abdomen: Reflux of contrast into the IVC and hepatic veins. IVC filter is partially imaged. No acute abnormality.  Musculoskeletal: Numerous dilated venous collaterals along the anterior chest wall are again noted. No new or acute bony abnormality.  Review of the MIP images confirms the above findings.  IMPRESSION: 1. Examination is positive for acute on chronic PE. Large chronic nonocclusive thrombus in the right main pulmonary artery. Small chronic nonocclusive thrombus in the distal left main pulmonary artery. New small pulmonary embolus involving a segmental branch artery within the left upper lobe. Similar findings of chronic right heart strain with elevated RV to LV ratio of approximately 1.9. 2. Lungs are clear.  No evidence of pulmonary infarction.  These results were called by telephone at the time of interpretation on 06/14/2023 at 8:52 am to provider Dr. Lenard Lance, who verbally acknowledged these results.  Electronically Signed   By: Duanne Guess D.O.   On: 06/14/2023 08:53 DG Chest Portable 1 View CLINICAL DATA:  Shortness of breath.  Respiratory distress  EXAM: PORTABLE CHEST 1 VIEW  COMPARISON:  05/16/2023  FINDINGS: Low volume chest. Central vascular congestion. Borderline heart size accentuated by technical factors. There is no edema, consolidation, effusion, or pneumothorax. Artifact from EKG leads.  IMPRESSION: Low volume chest with congested appearance of central vessels.  Electronically Signed   By: Tiburcio Pea M.D.   On: 06/14/2023 07:21  Lab Results  Component Value Date   WBC 10.7 (H) 06/14/2023   HGB 15.0 06/14/2023   HCT 46.3 06/14/2023   MCV 93.9 06/14/2023   PLT 223 06/14/2023   Last metabolic panel Lab Results  Component Value Date   GLUCOSE 145 (H) 06/14/2023   NA 135 06/14/2023   K 4.4 06/14/2023   CL 102 06/14/2023   CO2 23 06/14/2023    BUN 20 06/14/2023   CREATININE 1.56 (H) 06/14/2023   GFRNONAA 53 (L) 06/14/2023   CALCIUM 8.9 06/14/2023   PHOS 3.5 12/15/2022   PROT 7.4 06/02/2023   ALBUMIN 3.8 06/02/2023   LABGLOB 3.6 06/02/2023   AGRATIO 1.2 01/03/2023   BILITOT 0.5 06/02/2023   ALKPHOS 64 06/02/2023   AST 19 06/02/2023   ALT 25 06/02/2023   ANIONGAP 10 06/14/2023    Assessment and Plan: Acute on chronic respiratory failure with hypoxia (HCC) Decompensated respiratory status now requiring 2 to 3 L nasal cannula in the setting of concurrent acute on chronic PE  with heart strain, acute on chronic HFpEF, morbid obesity, sepsis secondary to left lower extremity wound Noted baseline 2 L nasal cannula liters transiently used at night Multifactorial in the setting of morbid obesity, PE with heart strain and decompensated heart failure On IV heparin drip with noted subtherapeutic INR IV Lasix IV antibiotics for sepsis Continue to monitor respiratory status Suspect underlying component of OHS given body habitus that will need to be addressed   Pulmonary emboli (HCC) Recurrent PE with noted progressive chest pain shortness of breath orthopnea Status post IVC x 2 CT of the chest with acute on chronic PE On Coumadin with INR subtherapeutic at 1.6 Case discussed with vascular surgery-tentatively not a surgical candidate for thrombectomy at present On heparin drip Continue Follow-up vascular surgery recommendations  Acute on chronic HFrEF (heart failure with reduced ejection fraction) (HCC) 2D echo 05/20/2023 with a EF of 60 to 65% Positive orthopnea, chest pain with noted concurrent acute on chronic PE as well as heart strain on imaging BNP 850s Marked generalized edema in setting of morbid obesity Gentle diuresis in setting of sepsis  Consult cardiology Strict ins and outs and daily weights Follow up cardiology recommendations  Severe sepsis [facility]) Meeting severe sepsis criteria with temperature of 101,  respirations in the upper 20s, lactate noted be greater than 2 White count 10.7 Concern for acute on chronic lower extremity wound with new ulcer formation and foul-smelling odor Will place on infectious coverage including Rocephin, Flagyl and vancomycin Panculture Plain film imaging of the left lower extremity Wound care consult ESR and sed rate Hold IV fluids in the setting of concurrent acute on chronic HFpEF with need for gentle diuresis Will otherwise continue to follow closely  History of pulmonary embolism and DVT History of recurrent PE and DVT status post multiple IVC filters On Coumadin secondary to body habitus Looks to have some episodes of?  Noncompliance Recurrent PE today with noted INR subtherapeutic at 1.6 Discussed importance of compliance Follow-up vascular surgery recommendation Will also consult hematology oncology given recurrent nature of symptoms  Type 2 diabetes mellitus with diabetic chronic kidney disease (HCC) SSI A1c  Essential hypertension BP stable Monitor with diuresis and concurrent acute on chronic PE with secondary heart strain  CKD (chronic kidney disease), stage IIIa Cr 1.5 w/ GFR in the 50s  Monitor w/ diuresis   Elevated troponin Troponin 40s to 50s Suspect secondary mild demand ischemia in the setting of acute on chronic PE, acute on chronic HFpEF as well as sepsis On heparin drip Trend troponin Monitor      Advance Care Planning:   Code Status: Full Code   Consults: Cardiology, vascular surgery, hem-onc   Family Communication: No family at the bedside   Severity of Illness: The appropriate patient status for this patient is INPATIENT. Inpatient status is judged to be reasonable and necessary in order to provide the required intensity of service to ensure the patient's safety. The patient's presenting symptoms, physical exam findings, and initial radiographic and laboratory data in the context of their chronic comorbidities is  felt to place them at high risk for further clinical deterioration. Furthermore, it is not anticipated that the patient will be medically stable for discharge from the hospital within 2 midnights of admission.   * I certify that at the point of admission it is my clinical judgment that the patient will require inpatient hospital care spanning beyond 2 midnights from the point of admission due to high intensity of service, high risk  for further deterioration and high frequency of surveillance required.*  Author: Floydene Flock, MD 06/14/2023 1:31 PM  For on call review [email or web address].

## 2023-06-14 NOTE — Assessment & Plan Note (Addendum)
History of recurrent PE and DVT status post multiple IVC filters On Coumadin secondary to body habitus Looks to have some episodes of?  Noncompliance Recurrent PE today with noted INR subtherapeutic at 1.6 Discussed importance of compliance Follow-up vascular surgery recommendation Will also consult hematology oncology given recurrent nature of symptoms

## 2023-06-14 NOTE — Consult Note (Signed)
Pharmacy Consult Note - Anticoagulation  Pharmacy Consult for heparin Indication: pulmonary embolus (Subtherapeutic INR while on warfarin  PATIENT MEASUREMENTS: Height: 5\' 7"  (170.2 cm) Weight: (!) 155.6 kg (343 lb) IBW/kg (Calculated) : 66.1 HEPARIN DW (KG): 104.5  VITAL SIGNS: Temp: 98.5 F (36.9 C) (10/15 1425) Temp Source: Oral (10/15 1425) BP: 158/106 (10/15 1600) Pulse Rate: 69 (10/15 1600)  Recent Labs    06/14/23 0630 06/14/23 0807 06/14/23 1652  HGB 15.0  --   --   HCT 46.3  --   --   PLT 223  --   --   LABPROT 18.8*  --   --   INR 1.6*  --   --   HEPARINUNFRC  --   --  0.46  CREATININE 1.56*  --   --   TROPONINIHS 48* 53*  --     Estimated Creatinine Clearance: 78.9 mL/min (A) (by C-G formula based on SCr of 1.56 mg/dL (H)).  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  Regular the workup has not been forthcoming) previously [facility] by Dr. Isaiah Serge [facility]   Hepatic steatosis    Hypertension    Morbid obesity with BMI of 50.0-59.9, adult (HCC)    Oxygen deficiency    Peripheral vascular disease (HCC)    Presence of IVC filter    Has 2 IVC filters with significant thrombus burden superior to the filter.   Pulmonary embolus (HCC) 12/2016   Initially just treated with anticoagulation, but in February 2023 in April 2024 treated with EKOS thrombectomy for submassive PE.   Ventricular trigeminy    Weakness of both lower limbs     ASSESSMENT: 52 y.o. male who presents to ED with shortness of breath requiring 2 liters on nasal cannula oxygen at all times with oxygen saturations of 90% while on  oxygen.  PMH includes CHF, diabetes, hypertension, multiple PEs status post thrombectomy earlier this year on warfarin.   No major DDI noted. 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 Limited data with DOACs.     *Per Hx patient intolerant to Xarelto (Gi side-effects) and failed to Elqius - see Hematology note from 03/01/2022*  Pertinent medications: PTA Warfarin 5 mg daily  Goal(s) of therapy: Heparin level 0.3 - 0.7 units/mL INR 2 - 3 Monitor platelets by anticoagulation protocol: Yes   Baseline anticoagulation labs: Recent Labs    06/14/23 0630  INR 1.6*  HGB 15.0  PLT 223   Heparin Date Time aPTT/HL Rate/Comment 10/15 0630   Baseline INR 1.6, start heparin infusion 10/15 1652 HL 0.46 Therapeutic x 1  PLAN: Heparin level therapeutic x 1. Continue heparin infusion at 1700 units/hour. Check heparin level in 6 hours, then daily once at least two levels are consecutively therapeutic. Monitor CBC daily while on heparin infusion.  Elliot Gurney, PharmD, BCPS Clinical Pharmacist  06/14/2023 5:20 PM

## 2023-06-14 NOTE — Assessment & Plan Note (Signed)
Cr 1.5 w/ GFR in the 50s  Monitor w/ diuresis

## 2023-06-14 NOTE — Consult Note (Signed)
Hospital Consult    Reason for Consult:  PE with Shortness of breath Requesting Physician:  Dr Minna Antis MD MRN #:  [id number]  History of Present Illness: [name] presents to the emergency department for shortness of breath starting around 1:00 last night he also states mild cough headache and chills with vomiting and diarrhea possibly indicating more of a viral infection. Patient is requiring oxygen at 4 L to maintain sats above 90% when normally does not require any oxygen during the day while awake, 2 L at night when sleeping only. Patients covid swab was negative with elevated troponin and BNP on work up. Vascular Surgery consulted to evaluate for pulmonary embolism Chronic vs New left subsegmental.   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  Regular the workup has not been forthcoming) previously followed by Dr. Isaiah Serge at [facility]   Hepatic steatosis    Hypertension    Morbid obesity with BMI of 50.0-59.9, adult (HCC)    Oxygen deficiency    Peripheral vascular disease (HCC)    Presence of IVC filter    Has 2 IVC filters with significant thrombus burden superior to the filter.   Pulmonary embolus (HCC) 12/2016   Initially just treated with anticoagulation, but in February 2023 in April 2024 treated with EKOS thrombectomy for submassive PE.   Ventricular trigeminy    Weakness of both lower limbs     Past Surgical History:  Procedure Laterality Date   CARDIAC ELECTROPHYSIOLOGY STUDY AND ABLATION  10/2020   PERIPHERAL VASCULAR CATHETERIZATION N/A 01/10/2015   Procedure: Dialysis/Perma Catheter Insertion;   Surgeon: Renford Dills, MD;  Location: [facility] INVASIVE CV LAB;  Service: Cardiovascular;  Laterality: N/A;   PERIPHERAL VASCULAR CATHETERIZATION N/A 02/24/2015   Procedure: Dialysis/Perma Catheter Removal;  Surgeon: Annice Needy, MD;  Location: [facility] INVASIVE CV LAB;  Service: Cardiovascular;  Laterality: N/A;   PULMONARY THROMBECTOMY N/A 12/13/2022   Procedure: PULMONARY THROMBECTOMY;  Surgeon: Annice Needy, MD;  Location: [facility] INVASIVE CV LAB;  Service: Cardiovascular;  Laterality: N/A;   PULMONARY THROMBECTOMY N/A 05/17/2023   Procedure: PULMONARY THROMBECTOMY;  Surgeon: Renford Dills, MD;  Location: [facility] INVASIVE CV LAB;  Service: Cardiovascular;  Laterality: N/A;    Allergies  Allergen Reactions   Metrizamide Other (See Comments)    Kidney failure requiring dialysis Kidney failure requiring dialysis    Clindamycin/Lincomycin Rash   Lincomycin Rash   Sulfa Antibiotics Rash    Prior to Admission medications   Medication Sig Start Date End Date Taking? Authorizing Provider  albuterol (VENTOLIN HFA) 108 (90 Base) MCG/ACT inhaler Inhale 2 puffs into the lungs every 6 (six) hours as needed for wheezing or shortness of breath. 02/23/22   Sunnie Nielsen, DO  furosemide (LASIX) 40 MG tablet Take 1 tablet (40 mg total) by mouth every other day. 04/21/23   Mecum, Erin E, PA-C  magnesium oxide (MAG-OX) 400 (240 Mg) MG tablet Take 400 mg by mouth daily.    [provider]  metoprolol succinate (TOPROL-XL) 25 MG 24 hr tablet Take 1 tablet (25 mg  total) by mouth daily. 02/23/22   Sunnie Nielsen, DO  OXYGEN Inhale 1 L into the lungs daily.    [provider]  sacubitril-valsartan (ENTRESTO) 24-26 MG Take 1 tablet by mouth 2 (two) times daily. 10/28/22   Delma Freeze, FNP  tirzepatide [facility]) 5 MG/0.5ML Pen Inject 5 mg into the skin once a week. 06/06/23   [name], NP  warfarin (COUMADIN) 5 MG tablet Take 1 tablet (5 mg total) by mouth daily. 06/06/23    [name], NP    Social History   Socioeconomic History   Marital status: Married    Spouse name: Not on file   Number of children: Not on file   Years of education: Not on file   Highest education level: Some college, no degree  Occupational History   Not on file  Tobacco Use   Smoking status: Never   Smokeless tobacco: Never  Vaping Use   Vaping status: Never Used  Substance and Sexual Activity   Alcohol use: Never   Drug use: Never   Sexual activity: Yes    Partners: Female  Other Topics Concern   Not on file  Social History Narrative   Not on file   Social Determinants of Health   Financial Resource Strain: Low Risk  (12/30/2022)   Overall Financial Resource Strain (CARDIA)    Difficulty of Paying Living Expenses: Not very hard  Food Insecurity: No Food Insecurity (06/14/2023)   Hunger Vital Sign    Worried About Running Out of Food in the Last Year: Never true    Ran Out of Food in the Last Year: Never true  Transportation Needs: No Transportation Needs (06/14/2023)   PRAPARE - Administrator, Civil Service (Medical): No    Lack of Transportation (Non-Medical): No  Physical Activity: Insufficiently Active (12/30/2022)   Exercise Vital Sign    Days of Exercise per Week: 3 days    Minutes of Exercise per Session: 30 min  Stress: No Stress Concern Present (12/30/2022)   [employer] of Occupational Health - Occupational Stress Questionnaire    Feeling of Stress : Not at all  Social Connections: Unknown (12/30/2022)   Social Connection and Isolation Panel [NHANES]    Frequency of Communication with Friends and Family: Three times a week    Frequency of Social Gatherings with Friends and Family: Once a week    Attends Religious Services: More than 4 times per year    Active Member of [employer] or Organizations: Yes    Attends Engineer, structural: More than 4 times per year    Marital Status: Patient declined  Intimate Partner Violence: Not  At Risk (06/14/2023)   Humiliation, Afraid, Rape, and Kick questionnaire    Fear of Current or Ex-Partner: No    Emotionally Abused: No    Physically Abused: No    Sexually Abused: No     Family History  Problem Relation Age of Onset   Hypertension Mother    Cancer Mother    Breast cancer Mother    Prostate cancer Father    Hypertension Father    Diabetes Father    Heart disease Maternal Grandmother    Pulmonary embolism Paternal Grandfather    Pulmonary embolism Paternal Uncle    Pulmonary embolism Cousin    Deep vein thrombosis Cousin    Pulmonary embolism Paternal Aunt     ROS: Otherwise negative unless mentioned in HPI  Physical Examination  Vitals:   06/14/23  0930 06/14/23 1000  BP: 122/82   Pulse: 79 86  Resp: (!) 26 19  Temp:  (!) 101.1 F (38.4 C)  SpO2: 95% 96%   Body mass index is 53.72 kg/m.  General:  WDWN in NAD Gait: Not observed HENT: WNL, normocephalic Pulmonary: normal non-labored breathing, without Rales, rhonchi,  wheezing Cardiac: regular, without  Murmurs, rubs or gallops; without carotid bruits Abdomen: Positive bowel sounds, soft, NT/ND, no masses Skin: without rashes Vascular Exam/Pulses: Unable to palpate peripheral pulses due to morbid obesity Extremities: without ischemic changes, without Gangrene , without cellulitis; without open wounds; Positive bilateral lower extremity venous stasis Musculoskeletal: no muscle wasting or atrophy  Neurologic: A&O X 3;  No focal weakness or paresthesias are detected; speech is fluent/normal Psychiatric:  The pt has Normal affect. Lymph:  Unremarkable  CBC    Component Value Date/Time   WBC 10.7 (H) 06/14/2023 0630   RBC 4.93 06/14/2023 0630   HGB 15.0 06/14/2023 0630   HGB 14.6 06/02/2023 0913   HCT 46.3 06/14/2023 0630   HCT 45.0 06/02/2023 0913   PLT 223 06/14/2023 0630   PLT 371 06/02/2023 0913   MCV 93.9 06/14/2023 0630   MCV 94 06/02/2023 0913   MCV 90 12/28/2014 0444   MCH 30.4  06/14/2023 0630   MCHC 32.4 06/14/2023 0630   RDW 16.6 (H) 06/14/2023 0630   RDW 15.2 06/02/2023 0913   RDW 14.0 12/28/2014 0444   LYMPHSABS 0.8 06/14/2023 0630   LYMPHSABS 1.6 06/02/2023 0913   LYMPHSABS 1.3 12/28/2014 0444   MONOABS 0.8 06/14/2023 0630   MONOABS 2.1 (H) 12/28/2014 0444   EOSABS 0.0 06/14/2023 0630   EOSABS 0.1 06/02/2023 0913   EOSABS 0.1 12/28/2014 0444   BASOSABS 0.0 06/14/2023 0630   BASOSABS 0.0 06/02/2023 0913   BASOSABS 0.1 12/28/2014 0444    BMET    Component Value Date/Time   NA 135 06/14/2023 0630   NA 140 06/02/2023 0913   NA 130 (L) 12/28/2014 1602   K 4.4 06/14/2023 0630   K 3.8 12/28/2014 1602   CL 102 06/14/2023 0630   CL 94 (L) 12/28/2014 1602   CO2 23 06/14/2023 0630   CO2 24 12/28/2014 1602   GLUCOSE 145 (H) 06/14/2023 0630   GLUCOSE 121 (H) 12/28/2014 1602   BUN 20 06/14/2023 0630   BUN 20 06/02/2023 0913   BUN 57 (H) 12/28/2014 1602   CREATININE 1.56 (H) 06/14/2023 0630   CREATININE 5.55 (H) 12/28/2014 1602   CALCIUM 8.9 06/14/2023 0630   CALCIUM 8.1 (L) 12/28/2014 1602   GFRNONAA 53 (L) 06/14/2023 0630   GFRNONAA 11 (L) 12/28/2014 1602   GFRAA 55 (L) 03/07/2020 2009   GFRAA 13 (L) 12/28/2014 1602    COAGS: Lab Results  Component Value Date   INR 1.6 (H) 06/14/2023   INR 3.0 (H) 06/02/2023   INR 2.7 (H) 05/25/2023     Non-Invasive Vascular Imaging:   EXAM:06/14/23 CT ANGIOGRAPHY CHEST WITH CONTRAST   TECHNIQUE: Multidetector CT imaging of the chest was performed using the standard protocol during bolus administration of intravenous contrast. Multiplanar CT image reconstructions and MIPs were obtained to evaluate the vascular anatomy.   RADIATION DOSE REDUCTION: This exam was performed according to the departmental dose-optimization program which includes automated exposure control, adjustment of the mA and/or kV according to patient size and/or use of iterative reconstruction technique.   CONTRAST:  75mL  OMNIPAQUE IOHEXOL 350 MG/ML SOLN   COMPARISON:  05/16/2023   FINDINGS:  Cardiovascular: Large chronic nonocclusive thrombus in the right main pulmonary artery. Small amount of nonocclusive thrombus extends into the lobar branches of the right upper and right lower lobes. Chronic peripheral nonocclusive thrombus in the distal left main pulmonary artery extending into the left lower lobe lobar branch pulmonary artery. Small thrombus involving a segmental branch artery within the left upper lobe anteriorly is new from prior (series 5, image 93). Similar findings of chronic right heart strain with elevated RV to LV ratio of approximately 1.9. Persistently dilated pulmonary trunk measuring 3.7 cm. Heart size is normal. No pericardial effusion. Thoracic aorta is nonaneurysmal.   Mediastinum/Nodes: No enlarged mediastinal, hilar, or axillary lymph nodes. Thyroid gland, trachea, and esophagus demonstrate no significant findings.   Lungs/Pleura: No focal airspace consolidation. No evidence of pulmonary infarction. No pleural effusion or pneumothorax.   Upper Abdomen: Reflux of contrast into the IVC and hepatic veins. IVC filter is partially imaged. No acute abnormality.   Musculoskeletal: Numerous dilated venous collaterals along the anterior chest wall are again noted. No new or acute bony abnormality.   Review of the MIP images confirms the above findings.   IMPRESSION: 1. Examination is positive for acute on chronic PE. Large chronic nonocclusive thrombus in the right main pulmonary artery. Small chronic nonocclusive thrombus in the distal left main pulmonary artery. New small pulmonary embolus involving a segmental branch artery within the left upper lobe. Similar findings of chronic right heart strain with elevated RV to LV ratio of approximately 1.9. 2. Lungs are clear.  No evidence of pulmonary infarction.  Statin:  No. Beta Blocker:  Yes.   Aspirin:  No. ACEI:  No. ARB:   Yes.   CCB use:  No Other antiplatelets/anticoagulants:  Yes.   Coumadin 5 mg Daily    ASSESSMENT/PLAN: This is a 52 y.o. male who presents to [facility] emergency department for shortness of breath that started last night with mild cough headache and chills with vomiting and diarrhea.  Patient has a known history of pulmonary embolisms in the past in which he was on Eliquis, Xarelto and now Coumadin.  He has been noncompliant in the past with his medications but today he insists he has been taking his Coumadin even though his INR is 1.6.  Subtherapeutic.  PLAN: Upon examination patient is resting comfortably in bed in ICU.  He does require supplemental oxygen at 4 L/min.  His oxygen saturation is now 100%.  Patient has normal respiratory effort while at rest.  Due to his morbid obesity his respiratory effort is labored on a regular basis.  Upon workup the patient is noted to be in heart failure with elevated BNP's.  At this time vascular surgery recommends that we correct the fluid overload creating his heart failure which in turn we hope will reduce his RV to LV ratio.  The new area of the left lower lobe subsegmental thrombosis does not appear to be increasing any of his normal shortness of breath with his heart failure.  Therefore vascular surgery does not recommend any procedure at this time.  We will reevaluate later in the week after the patient has been somewhat diuresed and may possibly be off supplemental oxygen.  We recommend the patient remain on a heparin infusion at this time until decisions been made as to whether to proceed with it pulmonary thrombectomy or transfer the patient back to oral Coumadin.   -I discussed the plan in detail with Dr. Levora Dredge MD and he is in agreement with  the plan.   Marcie Bal [facility] 06/14/2023 10:44 AM

## 2023-06-14 NOTE — Assessment & Plan Note (Signed)
Recurrent PE with noted progressive chest pain shortness of breath orthopnea Status post IVC x 2 CT of the chest with acute on chronic PE On Coumadin with INR subtherapeutic at 1.6 Case discussed with vascular surgery-tentatively not a surgical candidate for thrombectomy at present On heparin drip Continue Follow-up vascular surgery recommendations

## 2023-06-14 NOTE — Consult Note (Signed)
Advanced Heart Failure Team Consult Note   Primary Physician: [name], NP PCP-Cardiologist:  None  Reason for Consultation: Recurrent PE  HPI:    Ryan [name] is seen today for evaluation of recurrent pulmonary embolism at the request of Dr. Alvester Morin.   Patient is a chronically ill 52 year old gentleman with an extensive clotting history who presents for acute onset shortness of breath, nausea, fever/chills.  He was recently in the hospital as early as September 2024 for recurrent pulmonary embolism.  He underwent mechanical thrombectomy with vascular surgery, and reportedly felt significantly improved at discharge.  He was discharged on warfarin and reports that he has been compliant with his medical therapy since that time.  He has been followed in wound care for a chronic left lower extremity ulcer, but had been told that it was improving.  Around 1 AM this morning, he began experiencing shortness of breath, cough, and new diarrhea and vomiting.  He presented to the hospital and was found to be febrile with an elevated lactate.  Vital signs were within normal limits, although oxygen requirement had increased.  He was admitted to the ICU for further management.  CTA showed persistent nonocclusive thrombus in the right main pulmonary artery as well as new small left upper lobe segmental PE.  He has an extensive family history of clotting disorders and has been on Xarelto and warfarin in the past.  He reports that the men in his family are extremely prone to clotting.  "Follows with Hematology (Dr. Isaiah Serge), last seen in 2018. 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 Also strong family history of clotting. Work-up for hypercoagulability has been unrevealing thus far. Has had prior admissions for lytic therapy at [facility] in 11/2014 (when he had complete occlusion of the right common femoral vein) and in 12/2016 (when he had a  large PE in the right main pulmonary artery causing bilateral multiple segmental and subsegmental pulmonary arteries). 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 "  MTHFR gene mutation per history   - 68 (52 yo) - RLE DVT. Treated with warfarin.  - 1995 - LLE DVT, off warfarin.  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 S/p IVC filter x 2 (2006, 2012).  - 2016: Ilio-femoral DVT, treated with tPA. Thrombus extended along common iliac veins bilaterally and along IVC to 3.7 cm above the level of the IVC filter. Hospitalization c/b CIN/ARF requiring CRRT.  - 11/2016: PE in the setting of non-adherence to eliquis x 3 days. Treated with warfarin - 05/2017: Reported several interval VTE events. Negative thrombophelia eval (including prothrombin gene mutation).  - 09/2020: Admitted for PE  -11/2022: PE with thrombectomy -05/2023: PE with thrombectomy  Pos: Anticardiolipin IgG (29) Neg: ANA, ANCA, Lyme, RMSF, AntiCardiolipin IgM, Beta-2 glycoprotein, lupus anticoagulant, factor 5 leiden, Quantiferon, Hep C   Objective:    Vital Signs:   Temp:  [98.5 F (36.9 C)-101.5 F (38.6 C)] 98.5 F (36.9 C) (10/15 1425) Pulse Rate:  [67-93] 71 (10/15 1500) Resp:  [19-35] 25 (10/15 1500) BP: (106-145)/(69-94) 140/92 (10/15 1500) SpO2:  [91 %-98 %] 94 % (10/15 1500) Weight:  [155.6 kg] 155.6 kg (10/15 0628) Last BM Date : 06/14/23  Weight change: Filed Weights   06/14/23 0628  Weight: (!) 155.6 kg    Intake/Output:   Intake/Output Summary (Last 24 hours) at 06/14/2023 1543  Last data filed at 06/14/2023 1400 Gross per 24 hour  Intake 333.04 ml  Output 550 ml  Net -216.96 ml      Physical Exam    General:  Chronically ill-appearing, morbidly obese HEENT: normal Neck: Moderately elevated JV Cor: Not tachycardic, prominent P2 Lungs: clear Abdomen: soft, nontender, distended.   Extremities: Chronic venous stasis and lymphedema of the lower extremities, healing wound on the left lower extremity Neuro: alert & orientedx3, cranial nerves grossly intact. moves all 4 extremities w/o difficulty. Affect pleasant   Telemetry   Sinus rhythm in the 70s   Patient Profile   Ryan [name] is a 52 y.o. male with a medical history of extensive venous thromboembolism, atrial fibrillation/flutter, morbid obesity, likely pulmonary hypertension, diabetes, hypertension, CKD who presents with acute on chronic shortness of breath with likely superimposed sepsis.  Assessment/Plan   Acute on chronic hypoxic respiratory failure with recurrent pulmonary embolism: INR subtherapeutic on admission, new small upper lobe PE with persistence of large nonocclusive thrombus in the right main. 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 Agree with gentle diuresis, also that these is likely limited benefit for thrombectomy with normal HR, low new clot burden. Troponin low level and flat. - Gentle diuresis for right heart failure - Hold on IVF - Probably limited role for repeat thrombectomy - Heparin gtt while INR subtherapeutic - Likely warfarin at discharge - Will need outpatient RHC, hold for the time being given large clot burden - Could consider empiric treatment, but would hold with infectious workup ongoing - Sleep study at discharge - Autoimmune workup and hematology workup have been completed prior  Sepsis: Presented with fever, elevated lactate, likely source is nonhealing lower extremity wound. - Continue antibiotics per primary team   Length of Stay: 0  [name], MD  06/14/2023, 3:43 PM  Advanced Heart Failure Team Pager [phone] (M-F; 7a - 5p)  Please contact [facility] for night-coverage after hours (4p -7a ) and weekends on [email or web address]   CRITICAL  CARE Performed by: [name]   Total critical care time: 45 minutes  Critical care time was exclusive of separately billable procedures and treating other patients.  Critical care was necessary to treat or prevent imminent or life-threatening deterioration.  Critical care was time spent personally by me on the following activities: development of treatment plan with patient and/or surrogate as well as nursing, discussions with consultants, evaluation of patient's response to treatment, examination of patient, obtaining history from patient or surrogate, ordering and performing treatments and interventions, ordering and review of laboratory studies, ordering and review of radiographic studies, pulse oximetry and re-evaluation of patient's condition.

## 2023-06-14 NOTE — Assessment & Plan Note (Signed)
SSI A1c

## 2023-06-14 NOTE — Assessment & Plan Note (Addendum)
2D echo 05/20/2023 with a EF of 60 to 65% Positive orthopnea, chest pain with noted concurrent acute on chronic PE as well as heart strain on imaging BNP 850s Marked generalized edema in setting of morbid obesity Gentle diuresis in setting of sepsis  Consult cardiology Strict ins and outs and daily weights Follow up cardiology recommendations

## 2023-06-14 NOTE — Assessment & Plan Note (Signed)
Troponin 40s to 50s Suspect secondary mild demand ischemia in the setting of acute on chronic PE, acute on chronic HFpEF as well as sepsis On heparin drip Trend troponin Monitor

## 2023-06-15 DIAGNOSIS — J9621 Acute and chronic respiratory failure with hypoxia: Secondary | ICD-10-CM

## 2023-06-15 DIAGNOSIS — I2699 Other pulmonary embolism without acute cor pulmonale: Secondary | ICD-10-CM | POA: Diagnosis not present

## 2023-06-15 LAB — COMPREHENSIVE METABOLIC PANEL
ALT: 15 U/L (ref 0–44)
AST: 15 U/L (ref 15–41)
Albumin: 3.2 g/dL — ABNORMAL LOW (ref 3.5–5.0)
Alkaline Phosphatase: 48 U/L (ref 38–126)
Anion gap: 8 (ref 5–15)
BUN: 18 mg/dL (ref 6–20)
CO2: 21 mmol/L — ABNORMAL LOW (ref 22–32)
Calcium: 8.3 mg/dL — ABNORMAL LOW (ref 8.9–10.3)
Chloride: 103 mmol/L (ref 98–111)
Creatinine, Ser: 1.32 mg/dL — ABNORMAL HIGH (ref 0.61–1.24)
GFR, Estimated: >60 mL/min (ref >60)
Glucose, Bld: 114 mg/dL — ABNORMAL HIGH (ref 70–99)
Potassium: 4.2 mmol/L (ref 3.5–5.1)
Sodium: 132 mmol/L — ABNORMAL LOW (ref 135–145)
Total Bilirubin: 1.2 mg/dL (ref 0.3–1.2)
Total Protein: 7.3 g/dL (ref 6.5–8.1)

## 2023-06-15 LAB — CBC
HCT: 41.5 % (ref 39.0–52.0)
Hemoglobin: 13.6 g/dL (ref 13.0–17.0)
MCH: 30.5 pg (ref 26.0–34.0)
MCHC: 32.8 g/dL (ref 30.0–36.0)
MCV: 93 fL (ref 80.0–100.0)
Platelets: 190 10*3/uL (ref 150–400)
RBC: 4.46 MIL/uL (ref 4.22–5.81)
RDW: 16.7 % — ABNORMAL HIGH (ref 11.5–15.5)
WBC: 6.8 10*3/uL (ref 4.0–10.5)
nRBC: 0 % (ref 0.0–0.2)

## 2023-06-15 LAB — GLUCOSE, CAPILLARY
Glucose-Capillary: 131 mg/dL — ABNORMAL HIGH (ref 70–99)
Glucose-Capillary: 114 mg/dL — ABNORMAL HIGH (ref 70–99)
Glucose-Capillary: 132 mg/dL — ABNORMAL HIGH (ref 70–99)
Glucose-Capillary: 141 mg/dL — ABNORMAL HIGH (ref 70–99)
Glucose-Capillary: 73 mg/dL (ref 70–99)

## 2023-06-15 LAB — PREALBUMIN: Prealbumin: 23 mg/dL (ref 18–38)

## 2023-06-15 LAB — C-REACTIVE PROTEIN: CRP: 1.3 mg/dL — ABNORMAL HIGH (ref <1.0)

## 2023-06-15 LAB — HEPARIN LEVEL (UNFRACTIONATED): Heparin Unfractionated: 0.42 [IU]/mL (ref 0.30–0.70)

## 2023-06-15 LAB — HEMOGLOBIN A1C
Hgb A1c MFr Bld: 6.1 % — ABNORMAL HIGH (ref 4.8–5.6)
Mean Plasma Glucose: 128.37 mg/dL

## 2023-06-15 MED ORDER — JUVEN PO PACK
1.0000 | PACK | Freq: Two times a day (BID) | ORAL | Status: DC
  Administered 2023-06-15 – 2023-06-23 (×11): 1 via ORAL

## 2023-06-15 MED ORDER — VITAMIN C 500 MG PO TABS
500.0000 mg | ORAL_TABLET | Freq: Two times a day (BID) | ORAL | Status: DC
  Administered 2023-06-15 – 2023-06-23 (×16): 500 mg via ORAL
  Filled 2023-06-15 (×16): qty 1

## 2023-06-15 MED ORDER — SPIRONOLACTONE 25 MG PO TABS
25.0000 mg | ORAL_TABLET | Freq: Every day | ORAL | Status: DC
  Administered 2023-06-15 – 2023-06-23 (×9): 25 mg via ORAL
  Filled 2023-06-15 (×9): qty 1

## 2023-06-15 MED ORDER — FUROSEMIDE 10 MG/ML IJ SOLN: 40.0000 mg | Freq: Once | INTRAMUSCULAR | Status: DC

## 2023-06-15 MED ORDER — WARFARIN - PHARMACIST DOSING INPATIENT: Freq: Every day | Status: DC

## 2023-06-15 MED ORDER — WARFARIN SODIUM 7.5 MG PO TABS
7.5000 mg | ORAL_TABLET | Freq: Once | ORAL | Status: AC
  Administered 2023-06-15: 7.5 mg via ORAL
  Filled 2023-06-15: qty 1

## 2023-06-15 MED ORDER — VANCOMYCIN HCL 2000 MG/400ML IV SOLN
2000.0000 mg | INTRAVENOUS | Status: DC
  Administered 2023-06-16: 2000 mg via INTRAVENOUS
  Filled 2023-06-15: qty 400

## 2023-06-15 MED ORDER — FUROSEMIDE 10 MG/ML IJ SOLN
40.0000 mg | Freq: Two times a day (BID) | INTRAMUSCULAR | Status: DC
  Administered 2023-06-15 – 2023-06-16 (×3): 40 mg via INTRAVENOUS
  Filled 2023-06-15 (×3): qty 4

## 2023-06-15 MED ORDER — ADULT MULTIVITAMIN W/MINERALS CH
1.0000 | ORAL_TABLET | Freq: Every day | ORAL | Status: DC
  Administered 2023-06-16 – 2023-06-23 (×8): 1 via ORAL
  Filled 2023-06-15 (×8): qty 1

## 2023-06-15 MED ORDER — ENSURE MAX PROTEIN PO LIQD
11.0000 [oz_av] | Freq: Two times a day (BID) | ORAL | Status: DC
  Administered 2023-06-15 – 2023-06-16 (×3): 11 [oz_av] via ORAL
  Filled 2023-06-15: qty 330

## 2023-06-15 NOTE — Consult Note (Signed)
Pharmacy Consult Note - Anticoagulation  Pharmacy Consult for heparin Indication: pulmonary embolus (Subtherapeutic INR while on warfarin  PATIENT MEASUREMENTS: Height: 5\' 7"  (170.2 cm) Weight: (!) 155.6 kg (343 lb) IBW/kg (Calculated) : 66.1 HEPARIN DW (KG): 104.5  VITAL SIGNS: Temp: 97.8 F (36.6 C) (10/16 0200) Temp Source: Oral (10/16 0200) BP: 149/104 (10/16 0400) Pulse Rate: 70 (10/16 0400)  Recent Labs    06/14/23 0630 06/14/23 0807 06/14/23 1652 06/15/23 0405  HGB 15.0  --   --  13.6  HCT 46.3  --   --  41.5  PLT 223  --   --  190  LABPROT 18.8*  --   --   --   INR 1.6*  --   --   --   HEPARINUNFRC  --   --    < > 0.42  CREATININE 1.56*  --   --  1.32*  TROPONINIHS 48* 53*  --   --    < > = values in this interval not displayed.    Estimated Creatinine Clearance: 93.3 mL/min (A) (by C-G formula based on SCr of 1.32 mg/dL (H)).  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  Regular the workup has not been forthcoming) previously followed by Dr. Isaiah Serge at [facility]   Hepatic steatosis    Hypertension    Morbid obesity with BMI of 50.0-59.9, adult (HCC)    Oxygen deficiency    Peripheral vascular disease (HCC)    Presence of IVC filter    Has 2 IVC filters with significant thrombus burden superior to the filter.   Pulmonary embolus (HCC) 12/2016   Initially just treated with anticoagulation, but in February 2023 in April 2024 treated with EKOS thrombectomy for submassive PE.   Ventricular trigeminy    Weakness of both lower limbs     ASSESSMENT: 52 y.o. male who presents to ED with shortness of  breath requiring 2 liters on nasal cannula oxygen at all times with oxygen saturations of 90% while on oxygen.  PMH includes CHF, diabetes, hypertension, multiple PEs status post thrombectomy earlier this year on warfarin.   No major DDI noted. 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 Limited data with DOACs.     *Per Hx patient intolerant to Xarelto (Gi side-effects) and failed to Elqius - see Hematology note from 03/01/2022*  Pertinent medications: PTA Warfarin 5 mg daily  Goal(s) of therapy: Heparin level 0.3 - 0.7 units/mL INR 2 - 3 Monitor platelets by anticoagulation protocol: Yes   Baseline anticoagulation labs: Recent Labs    06/14/23 0630 06/15/23 0405  INR 1.6*  --   HGB 15.0 13.6  PLT 223 190   Heparin Date Time aPTT/HL Rate/Comment 10/15 0630   Baseline INR 1.6, start heparin infusion 10/15 1652 HL 0.46 Therapeutic x 1 10/15 2256 -- / 0.44 Therapeutic x 2 10/16 0405 -- / 0.42 Therapeutic x 3  PLAN: Continue heparin infusion at 1700 units/hour. Recheck HL daily w/ AM labs while therapeutic Monitor CBC daily while on heparin infusion.  Otelia Sergeant, PharmD, MBA  06/15/2023 5:21 AM

## 2023-06-15 NOTE — Plan of Care (Signed)
  Problem: Education: Goal: Knowledge of General Education information will improve Description: Including pain rating scale, medication(s)/side effects and non-pharmacologic comfort measures Outcome: Progressing   Problem: Clinical Measurements: Goal: Diagnostic test results will improve Outcome: Progressing Goal: Respiratory complications will improve Outcome: Progressing Goal: Cardiovascular complication will be avoided Outcome: Progressing   Problem: Activity: Goal: Risk for activity intolerance will decrease Outcome: Progressing   Problem: Fluid Volume: Goal: Ability to maintain a balanced intake and output will improve Outcome: Progressing

## 2023-06-15 NOTE — Progress Notes (Signed)
Progress Note    06/15/2023 10:09 AM * No surgery found *  Subjective:  [name] presents to the emergency department for shortness of breath with mild cough headache and chills with vomiting and diarrhea possibly indicating more of a viral infection. Patient is requiring oxygen at 4 L to maintain sats above 90% when normally does not require any oxygen during the day while awake, 2 L at night when sleeping only. Patients covid swab was negative with elevated troponin and BNP on work up.    Vitals:   06/15/23 0600 06/15/23 0700  BP: 128/85 (!) 142/100  Pulse: 76 74  Resp: (!) 29 (!) 28  Temp:    SpO2: 93% 92%   Physical Exam: Cardiac:  RRR, Positive +1 JVD Lungs:  Clear on auscultation with decreased breath sounds in multiple lung fields bilaterally. Labored breathing on exertion.Remains on 4 liters [place] oxygen  Incisions:  None  Extremities:  Bilateral lower extremities with +2 edema and venous stasis Abdomen:  Positive bowel sounds, soft non tender distended Neurologic: AAOX 4 follows commands and answers questions appropriately.   CBC    Component Value Date/Time   WBC 6.8 06/15/2023 0405   RBC 4.46 06/15/2023 0405   HGB 13.6 06/15/2023 0405   HGB 14.6 06/02/2023 0913   HCT 41.5 06/15/2023 0405   HCT 45.0 06/02/2023 0913   PLT 190 06/15/2023 0405   PLT 371 06/02/2023 0913   MCV 93.0 06/15/2023 0405   MCV 94 06/02/2023 0913   MCV 90 12/28/2014 0444   MCH 30.5 06/15/2023 0405   MCHC 32.8 06/15/2023 0405   RDW 16.7 (H) 06/15/2023 0405   RDW 15.2 06/02/2023 0913   RDW 14.0 12/28/2014 0444   LYMPHSABS 0.8 06/14/2023 0630   LYMPHSABS 1.6 06/02/2023 0913   LYMPHSABS 1.3 12/28/2014 0444   MONOABS 0.8 06/14/2023 0630   MONOABS 2.1 (H) 12/28/2014 0444   EOSABS 0.0 06/14/2023 0630   EOSABS 0.1 06/02/2023 0913   EOSABS 0.1 12/28/2014 0444   BASOSABS 0.0 06/14/2023 0630   BASOSABS 0.0 06/02/2023 0913   BASOSABS 0.1 12/28/2014 0444    BMET     Component Value Date/Time   NA 132 (L) 06/15/2023 0405   NA 140 06/02/2023 0913   NA 130 (L) 12/28/2014 1602   K 4.2 06/15/2023 0405   K 3.8 12/28/2014 1602   CL 103 06/15/2023 0405   CL 94 (L) 12/28/2014 1602   CO2 21 (L) 06/15/2023 0405   CO2 24 12/28/2014 1602   GLUCOSE 114 (H) 06/15/2023 0405   GLUCOSE 121 (H) 12/28/2014 1602   BUN 18 06/15/2023 0405   BUN 20 06/02/2023 0913   BUN 57 (H) 12/28/2014 1602   CREATININE 1.32 (H) 06/15/2023 0405   CREATININE 5.55 (H) 12/28/2014 1602   CALCIUM 8.3 (L) 06/15/2023 0405   CALCIUM 8.1 (L) 12/28/2014 1602   GFRNONAA >60 06/15/2023 0405   GFRNONAA 11 (L) 12/28/2014 1602   GFRAA 55 (L) 03/07/2020 2009   GFRAA 13 (L) 12/28/2014 1602    INR    Component Value Date/Time   INR 1.6 (H) 06/14/2023 0630   INR 3.0 (H) 06/02/2023 0912   INR 1.2 12/23/2014 2219     Intake/Output Summary (Last 24 hours) at 06/15/2023 1009 Last data filed at 06/15/2023 0643 Gross per 24 hour  Intake 1284.24 ml  Output 1320 ml  Net -35.76 ml     Assessment/Plan:  52 y.o. male who presents to [facility]  emergency department for shortness of breath that started last night with mild cough headache and chills with vomiting and diarrhea. Patient has a known history of pulmonary embolisms in the past in which he was on Eliquis, Xarelto and now Coumadin. He has been noncompliant in the past with his medications but today he insists he has been taking his Coumadin even though his INR is 1.6. Subtherapeutic.  * No surgery found *    PLAN: Upon examination patient is resting comfortably in bed in ICU. He does require supplemental oxygen at 4 L/min. His oxygen saturation is now 100%. Patient has normal respiratory effort while at rest. Due to his morbid obesity his respiratory effort is labored on a regular basis. Upon workup the patient is noted to be in heart failure with elevated BNP's. At this time vascular surgery recommends that we correct the fluid overload  creating his heart failure which in turn we hope will reduce his RV to LV ratio. No pulmonary thrombectomy at this time. Patient to be followed by Heart Failure Cardiology Team. Appreciate recommendations.   DVT prophylaxis:  Heparin infusions.    Marcie [facility] 06/15/2023 10:09 AM

## 2023-06-15 NOTE — Progress Notes (Signed)
PROGRESS NOTE    Ryan [name]   UXL:[id number] DOB: [birth year]  DOA: 06/14/2023 Date of Service: 06/15/23 which is hospital day 1  PCP: [name], NP    HPI: Ryan [name] is a 52 y.o. 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 Presented to ED 06/14/23 w/ respiratory distress, SOB. Of note, recent admission requiring mechanical thrombectomy 05/17/23, chronically on coumadin. No chest pain. Had mild cough, nausea/vomiting, diarrhea over few hours prior to arrival   Hospital course / significant events:  10/15: to ED, CTA chest (+)1 acute to subacute new PE in LUL, remainder of PE is chronic, chronic RV strain. Subtherapeutic INR 1.6. Requiring 4L O2 [place]. (+)chronic wound LLE w/ a foul odor, abx initiated. Concern for acute CHF. Vascular surgery consulted - continue heparin but no procedures planned for now, recs for diuresis and reevaluate. Hematology consulted - continue heparin bridging to coumadin w/ INR goal 2.5-3.  10/16: still tachypneic, on 4L O2. Cardiology following, increased diuresis. pharmacy following for coumadin bridging to goal INR .   Consultants:  Vascular Surgery Hematology/Oncology  Cardiology    Procedures/Surgeries: none      ASSESSMENT & PLAN:   Acute on chronic respiratory failure with hypoxia Ddx: CHF exacerbation / R heart strain, acute on chronic PE but this is fairly mild, sepsis d/t LE wound, restrictive lung disease d/t habitus  Treating underlying causes as below  Supplemental O2    Acute on Chronic Pulmonary emboli  Hx recurrent PE and DVT, failed Xarelto and Eliquis, hypercoag w/u neg  Status post IVC x 2 On Coumadin, w/ Subtherapeutic INR, question adherence  Vascular surgery following - no procedures  planned Hematology following - continue heparin gtt w/ bridge to coumadin  INR goal 2.5-3  Acute on chronic HFrEF (heart failure with reduced ejection fraction)  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 2D echo 05/20/2023 with a EF of 60 to 65% Diuresis in setting of sepsis --> increased IV lasix today per cardiology Staring spironolactone  Cardiology following Will need outpatient R heart cath outpatient Will need sleep study outpatient  Continue other GDMT Strict ins and outs and daily weights   SIRS d/t PE/CHF, questions severe sepsis d/t acute on chronic lower extremity wound with new ulcer formation and foul-smelling odor Meeting severe sepsis criteria with temperature of 101, respirations in the upper 20s, lactate noted be greater than 2, WBC 10.7 Rocephin, Flagyl and vancomycin Panculture Plain film imaging of the left lower extremity Wound care consult ESR and sed rate Hold IV fluids in the setting of concurrent acute on chronic HFpEF with need for gentle diuresis, BP has been appropriate     Type 2 diabetes mellitus with diabetic chronic kidney disease (HCC) SSI A1c   Essential hypertension BP stable Monitor with diuresis and concurrent acute on chronic PE with secondary heart strain   CKD (chronic kidney disease), stage IIIa Cr 1.5 w/ GFR in the 50s  Monitor w/ diuresis    Elevated troponin Troponin 40s to 50s Suspect secondary mild demand ischemia in the setting of acute on chronic PE, acute on chronic HFpEF as well as sepsis On heparin drip Trend troponin Monitor      Morbid obesity based on BMI: Body mass index is 53.72 kg/m.  Complicates clinical prognosis   DVT prophylaxis:  heparin/coumadin IV fluids: no continuous IV fluids  Nutrition: carb/cardiac Central lines / invasive devices: none  Code Status: FULL CODE ACP documentation reviewed: 06/15/23 and none on file in [facility]  TOC needs: TBD, expect may need to discharge on continuous  O2 rather than hs  Barriers to dispo / significant pending items: heparin gtt bridging to coumadin, treating CHF              Subjective / Brief ROS:  Patient reports feeling much better today Denies CP/SOB.  Denies LE pain/edema, reports legs are about at baseline  Pain controlled.  Denies new weakness.  Tolerating diet.  Reports no concerns w/ urination/defecation.   Family Communication: none at this time     Objective Findings:  Vitals:   06/15/23 1000 06/15/23 1100 06/15/23 1200 06/15/23 1300  BP: (!) 163/100 136/75 (!) 149/96 (!) 140/94  Pulse: 100 85 78 86  Resp: (!) 27 (!) 5 (!) 23 (!) 25  Temp:   97.6 F (36.4 C)   TempSrc:      SpO2: 93% 95% 93% 94%  Weight:      Height:        Intake/Output Summary (Last 24 hours) at 06/15/2023 1405 Last data filed at 06/15/2023 4332 Gross per 24 hour  Intake 951.2 ml  Output 770 ml  Net 181.2 ml   Filed Weights   06/14/23 0628  Weight: (!) 155.6 kg    Examination:  Physical Exam Constitutional:      General: He is not in acute distress.    Appearance: He is obese.  Cardiovascular:     Rate and Rhythm: Normal rate and regular rhythm.  Pulmonary:     Effort: Pulmonary effort is normal.     Breath sounds: Normal breath sounds.  Abdominal:     Tenderness: There is no abdominal tenderness.  Musculoskeletal:     Right lower leg: Edema present.     Left lower leg: Edema present.  Skin:    General: Skin is dry.  Neurological:     General: No focal deficit present.     Mental Status: He is alert and oriented to person, place, and time.  Psychiatric:        Mood and Affect: Mood normal.        Behavior: Behavior normal.          Scheduled Medications:   vitamin C  500 mg Oral BID   Chlorhexidine Gluconate Cloth  6 each Topical Daily   furosemide  40 mg Intravenous BID   insulin aspart  0-9 Units Subcutaneous TID WC   [START ON 06/16/2023] multivitamin with minerals  1 tablet Oral Daily    nutrition supplement (JUVEN)  1 packet Oral BID BM   Ensure Max Protein  11 oz Oral BID   spironolactone  25 mg Oral Daily   warfarin  7.5 mg Oral ONCE-1600   Warfarin - Pharmacist Dosing Inpatient   Does not apply q1600    Continuous Infusions:  cefTRIAXone (ROCEPHIN)  IV Stopped (06/14/23 1604)   And   metronidazole Stopped (06/15/23 0617)   heparin 1,700 Units/hr (06/15/23 0643)   [START ON 06/16/2023] vancomycin      PRN Medications:  mouth rinse  Antimicrobials from admission:  Anti-infectives (From admission, onward)    Start     Dose/Rate Route Frequency Ordered Stop   06/16/23 0600  vancomycin (VANCOREADY) IVPB 2000 mg/400 mL        2,000 mg 200 mL/hr over 120  Minutes Intravenous Every 24 hours 06/15/23 1342     06/15/23 0600  vancomycin (VANCOREADY) IVPB 1750 mg/350 mL  Status:  Discontinued        1,750 mg 175 mL/hr over 120 Minutes Intravenous Every 24 hours 06/14/23 1316 06/15/23 1342   06/14/23 1500  vancomycin (VANCOREADY) IVPB 1500 mg/300 mL        1,500 mg 150 mL/hr over 120 Minutes Intravenous  Once 06/14/23 1316 06/14/23 1823   06/14/23 1400  cefTRIAXone (ROCEPHIN) 2 g in sodium chloride 0.9 % 100 mL IVPB       Placed in "And" Linked Group   2 g 200 mL/hr over 30 Minutes Intravenous Every 24 hours 06/14/23 1256 06/21/23 1359   06/14/23 1400  metroNIDAZOLE (FLAGYL) IVPB 500 mg       Placed in "And" Linked Group   500 mg 100 mL/hr over 60 Minutes Intravenous Every 8 hours 06/14/23 1256 06/21/23 1359   06/14/23 0900  vancomycin (VANCOCIN) IVPB 1000 mg/200 mL premix        1,000 mg 200 mL/hr over 60 Minutes Intravenous  Once 06/14/23 0858 06/14/23 1012           Data Reviewed:  I have personally reviewed the following...  CBC: Recent Labs  Lab 06/14/23 0630 06/15/23 0405  WBC 10.7* 6.8  NEUTROABS 9.0*  --   HGB 15.0 13.6  HCT 46.3 41.5  MCV 93.9 93.0  PLT 223 190   Basic Metabolic Panel: Recent Labs  Lab 06/14/23 0630 06/15/23 0405   NA 135 132*  K 4.4 4.2  CL 102 103  CO2 23 21*  GLUCOSE 145* 114*  BUN 20 18  CREATININE 1.56* 1.32*  CALCIUM 8.9 8.3*   GFR: Estimated Creatinine Clearance: 93.3 mL/min (A) (by C-G formula based on SCr of 1.32 mg/dL (H)). Liver Function Tests: Recent Labs  Lab 06/15/23 0405  AST 15  ALT 15  ALKPHOS 48  BILITOT 1.2  PROT 7.3  ALBUMIN 3.2*   No results for input(s): "LIPASE", "AMYLASE" in the last 168 hours. No results for input(s): "AMMONIA" in the last 168 hours. Coagulation Profile: Recent Labs  Lab 06/14/23 0630  INR 1.6*   Cardiac Enzymes: No results for input(s): "CKTOTAL", "CKMB", "CKMBINDEX", "TROPONINI" in the last 168 hours. BNP (last 3 results) No results for input(s): "PROBNP" in the last 8760 hours. HbA1C: Recent Labs    06/14/23 0943  HGBA1C 6.1*   CBG: Recent Labs  Lab 06/14/23 1026 06/14/23 1710 06/14/23 2203 06/15/23 0749 06/15/23 1301  GLUCAP 131* 122* 135* 114* 132*   Lipid Profile: No results for input(s): "CHOL", "HDL", "LDLCALC", "TRIG", "CHOLHDL", "LDLDIRECT" in the last 72 hours. Thyroid Function Tests: No results for input(s): "TSH", "T4TOTAL", "FREET4", "T3FREE", "THYROIDAB" in the last 72 hours. Anemia Panel: No results for input(s): "VITAMINB12", "FOLATE", "FERRITIN", "TIBC", "IRON", "RETICCTPCT" in the last 72 hours. Most Recent Urinalysis On File:     Component Value Date/Time   COLORURINE YELLOW (A) 06/14/2023 0827   APPEARANCEUR CLEAR (A) 06/14/2023 0827   APPEARANCEUR Clear 03/18/2022 1436   LABSPEC 1.037 (H) 06/14/2023 0827   LABSPEC 1.012 12/25/2014 1655   PHURINE 5.0 06/14/2023 0827   GLUCOSEU NEGATIVE 06/14/2023 0827   GLUCOSEU Negative 12/25/2014 1655   HGBUR NEGATIVE 06/14/2023 0827   BILIRUBINUR NEGATIVE 06/14/2023 0827   BILIRUBINUR Negative 03/18/2022 1436   BILIRUBINUR Negative 12/25/2014 1655   KETONESUR NEGATIVE 06/14/2023 0827   PROTEINUR >=300 (A) 06/14/2023 0827   NITRITE NEGATIVE 06/14/2023 0827  LEUKOCYTESUR NEGATIVE 06/14/2023 0827   LEUKOCYTESUR Negative 12/25/2014 1655   Sepsis Labs: @LABRCNTIP (procalcitonin:4,lacticidven:4) Microbiology: Recent Results (from the past 240 hour(s))  Resp panel by RT-PCR (RSV, Flu A&B, Covid) Anterior Nasal Swab     Status: None   Collection Time: 06/14/23  6:32 AM   Specimen: Anterior Nasal Swab  Result Value Ref Range Status   SARS Coronavirus 2 by RT PCR NEGATIVE NEGATIVE Final    Comment: (NOTE) SARS-CoV-2 target nucleic acids are NOT DETECTED.  The SARS-CoV-2 RNA is generally detectable in upper respiratory specimens during the acute phase of infection. The lowest concentration of SARS-CoV-2 viral copies this assay can detect is 138 copies/mL. A negative result does not preclude SARS-Cov-2 infection and should not be used as the sole basis for treatment or other patient management decisions. A negative result may occur with  improper specimen collection/handling, submission of specimen other than nasopharyngeal swab, presence of viral mutation(s) within the areas targeted by this assay, and inadequate number of viral copies(<138 copies/mL). A negative result must be combined with clinical observations, patient history, and epidemiological information. The expected result is Negative.  Fact Sheet for Patients:  BloggerCourse.com  Fact Sheet for Healthcare Providers:  SeriousBroker.it  This test is no t yet approved or cleared by the [place] FDA and  has been authorized for detection and/or diagnosis of SARS-CoV-2 by FDA under an Emergency Use Authorization (EUA). This EUA will remain  in effect (meaning this test can be used) for the duration of the COVID-19 declaration under Section 564(b)(1) of the Act, 21 U.S.C.section 360bbb-3(b)(1), unless the authorization is terminated  or revoked sooner.       Influenza A by PCR NEGATIVE NEGATIVE Final   Influenza B by PCR  NEGATIVE NEGATIVE Final    Comment: (NOTE) The Xpert Xpress SARS-CoV-2/FLU/RSV plus assay is intended as an aid in the diagnosis of influenza from Nasopharyngeal swab specimens and should not be used as a sole basis for treatment. Nasal washings and aspirates are unacceptable for Xpert Xpress SARS-CoV-2/FLU/RSV testing.  Fact Sheet for Patients: BloggerCourse.com  Fact Sheet for Healthcare Providers: SeriousBroker.it  This test is not yet approved or cleared by the [place] FDA and has been authorized for detection and/or diagnosis of SARS-CoV-2 by FDA under an Emergency Use Authorization (EUA). This EUA will remain in effect (meaning this test can be used) for the duration of the COVID-19 declaration under Section 564(b)(1) of the Act, 21 U.S.C. section 360bbb-3(b)(1), unless the authorization is terminated or revoked.     Resp Syncytial Virus by PCR NEGATIVE NEGATIVE Final    Comment: (NOTE) Fact Sheet for Patients: BloggerCourse.com  Fact Sheet for Healthcare Providers: SeriousBroker.it  This test is not yet approved or cleared by the [place] FDA and has been authorized for detection and/or diagnosis of SARS-CoV-2 by FDA under an Emergency Use Authorization (EUA). This EUA will remain in effect (meaning this test can be used) for the duration of the COVID-19 declaration under Section 564(b)(1) of the Act, 21 U.S.C. section 360bbb-3(b)(1), unless the authorization is terminated or revoked.  Performed at [facility], [address] [place], [place] [address]   Blood Culture (routine x 2)     Status: None (Preliminary result)   Collection Time: 06/14/23  8:27 AM   Specimen: BLOOD  Result Value Ref Range Status   Specimen Description BLOOD RIGHT HAND  Final   Special Requests   Final    BOTTLES DRAWN AEROBIC AND ANAEROBIC Blood Culture  adequate volume    Culture   Final    NO GROWTH < 24 HOURS Performed at [facility], [address] [place], [place] [address]    Report Status PENDING  Incomplete  Blood Culture (routine x 2)     Status: None (Preliminary result)   Collection Time: 06/14/23  8:27 AM   Specimen: BLOOD  Result Value Ref Range Status   Specimen Description BLOOD RIGHT ARM  Final   Special Requests   Final    BOTTLES DRAWN AEROBIC AND ANAEROBIC Blood Culture adequate volume   Culture   Final    NO GROWTH < 24 HOURS Performed at [facility], [address] [place], [place] [address]    Report Status PENDING  Incomplete  MRSA Next Gen by PCR, Nasal     Status: None   Collection Time: 06/14/23 10:45 AM   Specimen: Nasal Mucosa; Nasal Swab  Result Value Ref Range Status   MRSA by PCR Next Gen NOT DETECTED NOT DETECTED Final    Comment: (NOTE) The GeneXpert MRSA Assay (FDA approved for NASAL specimens only), is one component of a comprehensive MRSA colonization surveillance program. It is not intended to diagnose MRSA infection nor to guide or monitor treatment for MRSA infections. Test performance is not FDA approved in patients less than 34 years old. Performed at [facility], [address], [place], [place] [address]       Radiology Studies last 3 days: DG Foot 2 Views Left  Result Date: 06/14/2023 CLINICAL DATA:  Cellulitis of the left foot. EXAM: LEFT FOOT - 2 VIEW COMPARISON:  None available. FINDINGS: There is no acute fracture or dislocation. The bones are osteopenic. No bone erosion or periosteal elevation. There is diffuse subcutaneous edema. No radiopaque foreign object or soft tissue gas. IMPRESSION: 1. No acute fracture or dislocation. 2. Diffuse subcutaneous edema. Electronically Signed   By: Elgie Collard M.D.   On: 06/14/2023 17:45   CT Angio Chest PE W and/or Wo Contrast  Result Date: 06/14/2023 CLINICAL DATA:  Shortness of breath. History of chronic  pulmonary emboli. EXAM: CT ANGIOGRAPHY CHEST WITH CONTRAST TECHNIQUE: Multidetector CT imaging of the chest was performed using the standard protocol during bolus administration of intravenous contrast. Multiplanar CT image reconstructions and MIPs were obtained to evaluate the vascular anatomy. RADIATION DOSE REDUCTION: This exam was performed according to the departmental dose-optimization program which includes automated exposure control, adjustment of the mA and/or kV according to patient size and/or use of iterative reconstruction technique. CONTRAST:  75mL OMNIPAQUE IOHEXOL 350 MG/ML SOLN COMPARISON:  05/16/2023 FINDINGS: Cardiovascular: Large chronic nonocclusive thrombus in the right main pulmonary artery. Small amount of nonocclusive thrombus extends into the lobar branches of the right upper and right lower lobes. Chronic peripheral nonocclusive thrombus in the distal left main pulmonary artery extending into the left lower lobe lobar branch pulmonary artery. Small thrombus involving a segmental branch artery within the left upper lobe anteriorly is new from prior (series 5, image 93). Similar findings of chronic right heart strain with elevated RV to LV ratio of approximately 1.9. Persistently dilated pulmonary trunk measuring 3.7 cm. Heart size is normal. No pericardial effusion. Thoracic aorta is nonaneurysmal. Mediastinum/Nodes: No enlarged mediastinal, hilar, or axillary lymph nodes. Thyroid gland, trachea, and esophagus demonstrate no significant findings. Lungs/Pleura: No focal airspace consolidation. No evidence of pulmonary infarction. No pleural effusion or pneumothorax. Upper Abdomen: Reflux of contrast into the IVC and hepatic veins. IVC filter is partially  imaged. No acute abnormality. Musculoskeletal: Numerous dilated venous collaterals along the anterior chest wall are again noted. No new or acute bony abnormality. Review of the MIP images confirms the above findings. IMPRESSION: 1.  Examination is positive for acute on chronic PE. Large chronic nonocclusive thrombus in the right main pulmonary artery. Small chronic nonocclusive thrombus in the distal left main pulmonary artery. New small pulmonary embolus involving a segmental branch artery within the left upper lobe. Similar findings of chronic right heart strain with elevated RV to LV ratio of approximately 1.9. 2. Lungs are clear.  No evidence of pulmonary infarction. These results were called by telephone at the time of interpretation on 06/14/2023 at 8:52 am to provider Dr. Lenard Lance, who verbally acknowledged these results. Electronically Signed   By: Duanne Guess D.O.   On: 06/14/2023 08:53   DG Chest Portable 1 View  Result Date: 06/14/2023 CLINICAL DATA:  Shortness of breath.  Respiratory distress EXAM: PORTABLE CHEST 1 VIEW COMPARISON:  05/16/2023 FINDINGS: Low volume chest. Central vascular congestion. Borderline heart size accentuated by technical factors. There is no edema, consolidation, effusion, or pneumothorax. Artifact from EKG leads. IMPRESSION: Low volume chest with congested appearance of central vessels. Electronically Signed   By: Tiburcio Pea M.D.   On: 06/14/2023 07:21        Sunnie Nielsen, DO [facility] 06/15/2023, 2:05 PM    Dictation software may have been used to generate the above note. Typos may occur and escape review in typed/dictated notes. Please contact Dr Lyn Hollingshead directly for clarity if needed.  Staff may message me via secure chat in Epic  but this may not receive an immediate response,  please page me for urgent matters!  If 7PM-7AM, please contact night coverage [email or web address]

## 2023-06-15 NOTE — Progress Notes (Signed)
Transition of Care [facility]) - Inpatient Brief Assessment   Patient Details  Name: Ryan [name] MRN: [id number] Date of Birth: [birth year]  Transition of Care [facility]) CM/SW Contact:    Truddie Hidden, RN Phone Number: 06/15/2023, 3:24 PM   Clinical Narrative:  TOC continuing to follow patient's progress throughout discharge planning.   Transition of Care Asessment: Insurance and Status: Insurance coverage has been reviewed Patient has primary care physician: Yes Home environment has been reviewed: Home Prior level of function:: independent Prior/Current Home Services: No current home services Social Determinants of Health Reivew: SDOH reviewed no interventions necessary Readmission risk has been reviewed: Yes Transition of care needs: no transition of care needs at this time

## 2023-06-15 NOTE — Progress Notes (Signed)
    Advanced Heart Failure Rounding Note  PCP-Cardiologist: None   Subjective:     Sitting up in bed, feels marginally better today. 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 Heart rate remains controlled.   Objective:   Weight Range: (!) 155.6 kg Body mass index is 53.72 kg/m.   Vital Signs:   Temp:  [97.8 F (36.6 C)-101.7 F (38.7 C)] 97.8 F (36.6 C) (10/16 0200) Pulse Rate:  [67-86] 74 (10/16 0700) Resp:  [19-29] 28 (10/16 0700) BP: (106-158)/(69-106) 142/100 (10/16 0700) SpO2:  [90 %-98 %] 92 % (10/16 0700) Last BM Date : 06/14/23  Weight change: Filed Weights   06/14/23 0981  Weight: (!) 155.6 kg    Intake/Output:   Intake/Output Summary (Last 24 hours) at 06/15/2023 0919 Last data filed at 06/15/2023 1914 Gross per 24 hour  Intake 1284.24 ml  Output 1320 ml  Net -35.76 ml      Physical Exam    General:  Chronically ill-appearing, morbidly obese HEENT: normal Neck: Moderately elevated JV Cor: Not tachycardic, prominent P2 Lungs: clear Abdomen: soft, nontender, distended.  Extremities: Chronic venous stasis and lymphedema of the lower extremities, poorly healing wound on the left lower extremity Neuro: alert & orientedx3, cranial nerves grossly intact. moves all 4 extremities w/o difficulty. Affect pleasant    Telemetry   Sinus in the 70s.  Patient Profile   Ryan [name] is a 52 y.o. 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 Would likely benefit from addition of riociguat  as a bridge to potential PTE, but will hold while in the acute setting. Will increase diuresis given oxygen requirement, though based on echocardiographic data and exam his heart failure is predominantly acute on chronic right heart failure.  - Increase lasix to 40mg  IV BID - Urine sodium - Start spironolactone 25mg  daily - Probably limited role for repeat thrombectomy - Heparin gtt while INR subtherapeutic - Likely warfarin at discharge with higher INR target (2.5-3) - Will need outpatient RHC, hold for the time being given large clot burden - Sleep study at discharge - Autoimmune workup and hematology workup have been completed prior   Sepsis: Presented with fever, elevated lactate, likely source is nonhealing lower extremity wound. - Continue antibiotics per primary team   Length of Stay: 1  [name], MD  06/15/2023, 9:19 AM  Advanced Heart Failure Team Pager [phone] (M-F; 7a - 5p)  Please contact [facility] for night-coverage after hours (5p -7a ) and weekends on [email or web address]

## 2023-06-15 NOTE — Consult Note (Signed)
Pharmacy Consult Note - Anticoagulation  Pharmacy Consult for heparin, warfarin Indication: pulmonary embolus (Subtherapeutic INR while on warfarin)  PATIENT MEASUREMENTS: Height: 5\' 7"  (170.2 cm) Weight: (!) 155.6 kg (343 lb) IBW/kg (Calculated) : 66.1 HEPARIN DW (KG): 104.5  VITAL SIGNS: Temp: 97.8 F (36.6 C) (10/16 0200) Temp Source: Oral (10/16 0200) BP: 142/100 (10/16 0700) Pulse Rate: 74 (10/16 0700)  Recent Labs    06/14/23 0630 06/14/23 0807 06/14/23 1652 06/15/23 0405  HGB 15.0  --   --  13.6  HCT 46.3  --   --  41.5  PLT 223  --   --  190  LABPROT 18.8*  --   --   --   INR 1.6*  --   --   --   HEPARINUNFRC  --   --    < > 0.42  CREATININE 1.56*  --   --  1.32*  TROPONINIHS 48* 53*  --   --    < > = values in this interval not displayed.    Estimated Creatinine Clearance: 93.3 mL/min (A) (by C-G formula based on SCr of 1.32 mg/dL (H)).  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  Regular the workup has not been forthcoming) previously followed by Dr. Isaiah Serge at [facility]   Hepatic steatosis    Hypertension    Morbid obesity with BMI of 50.0-59.9, adult (HCC)    Oxygen deficiency    Peripheral vascular disease (HCC)    Presence of IVC filter    Has 2 IVC filters with significant thrombus burden superior to the filter.   Pulmonary embolus (HCC) 12/2016   Initially just treated with anticoagulation, but in February 2023 in April 2024 treated with EKOS thrombectomy for submassive PE.   Ventricular trigeminy    Weakness of both lower limbs     ASSESSMENT: 52 y.o. male who presents to ED with  shortness of breath requiring 2 liters on nasal cannula oxygen at all times with oxygen saturations of 90% while on oxygen.  PMH includes CHF, diabetes, hypertension, multiple PEs status post thrombectomy earlier this year on warfarin.  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 Limited data with DOACs.    *Per Hx patient intolerant to Xarelto (Gi side-effects) and failed to Elqius - see Hematology note from 03/01/2022*  DDIs. tirzepatide PTA ---current: metronidazole, ceftriaxone, vancomycin  Pertinent medications: PTA Warfarin 5 mg daily  Goal(s) of therapy: Heparin level 0.3 - 0.7 units/mL INR 2 - 3 Monitor platelets by anticoagulation protocol: Yes   Baseline anticoagulation labs: Recent Labs    06/14/23 0630 06/15/23 0405  INR 1.6*  --   HGB 15.0 13.6  PLT 223 190   Heparin Date Time aPTT/HL Rate/Comment 10/15 0630   Baseline INR 1.6, start heparin infusion 10/15 1652 HL 0.46 Therapeutic x 1 10/15 2256 -- / 0.44 Therapeutic x 2 10/16 0405 -- / 0.42 Therapeutic x 3  Heparin PLAN: Continue heparin infusion at 1700 units/hour. Recheck heparin level once daily w/ AM labs while therapeutic Monitor CBC daily while on heparin infusion.  Warfarin Plan Warfarin 7.5 mg po x 1 INR in am  Burnis Medin, PharmD, BCPS 06/15/2023 12:40 PM

## 2023-06-15 NOTE — Progress Notes (Addendum)
Initial Nutrition Assessment  DOCUMENTATION CODES:   Morbid obesity  INTERVENTION:   Ensure Max protein supplement po BID, each supplement provides 150kcal and 30g of protein.  Juven Fruit Punch po BID, each serving provides 95kcal and 2.5g of protein (amino acids glutamine and arginine)  MVI po daily  Vitamin C 500mg  po BID  Daily weights   NUTRITION DIAGNOSIS:   Increased nutrient needs related to wound healing as evidenced by estimated needs.  GOAL:   Patient will meet greater than or equal to 90% of their needs  MONITOR:   PO intake, Supplement acceptance, Labs, Weight trends, Skin, I & O's  REASON FOR ASSESSMENT:   Consult Wound healing  ASSESSMENT:   52 y.o. 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  Met with pt in room today. Pt reports good appetite and oral intake at baseline but reports poor oral intake for one day pta. Pt reports that his appetite is good in hospital; pt ate 100% of his breakfast. Pt reports that he drinks Juven at home that he gets from the wound clinic (pt given coupons today). RD discussed with pt the importance of adequate nutrition needed to preserve lean muscle and to support wound healing. Pt is willing to drink vanilla Ensure in hospital. RD will add supplements and vitamins to support wound healing.    Per chart, pt is down 18lbs over the past 5 months. Pt reports that he has been intentionally losing weight as he needs to get below 250lbs so he can come off of the warfarin. Pt reports that he was started on mounjaro to help him loose weight. Pt reports that in total, he has lost 40lbs.   Medications reviewed and include: lasix, insulin, aldactone, ceftriaxone, metronidazole, heparin, vancomycin   Labs reviewed:  Na 132(L), K 4.2 wnl, creat 1.32(H) Cbgs- 114, 135, 122, 131 x 24 hrs  AIC 6.1(H)- 10/15  NUTRITION - FOCUSED PHYSICAL EXAM:  Flowsheet Row Most Recent Value  Orbital Region No depletion  Upper Arm Region No depletion  Thoracic and Lumbar Region No depletion  Buccal Region No depletion  Temple Region No depletion  Clavicle Bone Region No depletion  Clavicle and Acromion Bone Region No depletion  Scapular Bone Region No depletion  Dorsal Hand No depletion  Patellar Region No depletion  Anterior Thigh Region No depletion  Posterior Calf Region No depletion  Edema (RD Assessment) Mild  Hair Reviewed  Eyes Reviewed  Mouth Reviewed  Skin Reviewed  Nails Reviewed   Diet Order:   Diet Order             Diet heart healthy/carb modified Fluid consistency: Thin  Diet effective now                  EDUCATION NEEDS:   Education needs have been addressed  Skin:  Skin Assessment: Reviewed RN Assessment (VSU)  Last BM:  10/15- type 7  Height:   Ht Readings from Last 1 Encounters:  06/14/23 5\' 7"  (1.702 m)    Weight:   Wt Readings from Last 1 Encounters:  06/14/23 (!) 155.6 kg    Ideal Body Weight:  67.2 kg  BMI:  Body mass index is 53.72 kg/m.  Estimated Nutritional Needs:   Kcal:  2700-3000kcal/day  Protein:  >135g/day  Fluid:  1.9-2.2L/day  Betsey Holiday  MS, RD, LDN Please refer to [facility] for RD and/or RD on-call/weekend/after hours pager

## 2023-06-15 NOTE — Hospital Course (Addendum)
HPI: Ryan [name] is a 52 y.o. 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 Presented to ED 06/14/23 w/ respiratory distress, SOB. Of note, recent admission requiring mechanical thrombectomy 05/17/23, chronically on coumadin. No chest pain. Had mild cough, nausea/vomiting, diarrhea over few hours prior to arrival   Hospital course / significant events:  10/15: to ED, CTA chest (+)1 acute to subacute new PE in LUL, remainder of PE is chronic, chronic RV strain. Subtherapeutic INR 1.6. Requiring 4L O2 [place]. (+)chronic wound LLE w/ a foul odor, abx initiated. Concern for acute CHF. Vascular surgery consulted - continue heparin but no procedures planned for now, recs for diuresis and reevaluate. Hematology consulted - continue heparin bridging to coumadin w/ INR goal 2.5-3.  10/16: still tachypneic, on 4L O2. Cardiology following, increased diuresis. pharmacy following for coumadin bridging to goal INR .  10/17-10/18: BCx NG.  INR still subtherapeutic, continue heparin. Weaning off O2.  10/19: AFlutter RVR requiring amiodarone gtt, restarted beta blocker  10/20: remains Aflutter/rapid but improved, staying on amio gtt for today 10/21 continue amio, cardiology planning TEE/DCCV tomorrow  10/22: TEE/DCCV w/ conversion to sinus rhythm  Consultants:  Vascular Surgery Hematology/Oncology  Cardiology    Procedures/Surgeries: 06/21/23 TEE/DCCV w/ conversion to sinus rhythm      ASSESSMENT & PLAN:   Acute on chronic respiratory failure with hypoxia Ddx: CHF exacerbation / R heart strain, acute on chronic PE but this is fairly mild, sepsis d/t LE wound, restrictive lung disease d/t habitus  Treating underlying causes as below  Supplemental O2    Hx paroxysmal Afib Aflutter w/ RVR  morning 06/18/23  Given IV labetalol with spontaneous conversion to sinus rhythm briefly but later on reverted to atrial flutter.  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 2D echo 05/20/2023 with a EF of 60 to 65% torsemide 40 mg daily - held d/t AKI can hopefully restart in 1-2 days spironolactone 25 mg daily  Entresto 24/26  Lopressor 12.5 mg every 6 hours  Cardiology following Will need outpatient R heart cath  Will need outpatient sleep study  Strict ins and outs and daily weights   SIRS d/t PE/CHF, questions severe sepsis d/t acute on chronic lower extremity wound with new ulcer formation and foul-smelling odor Sepsis ruled out clinically, suspect SIRS d/t PE/CHF  Meeting SIRS vs severe sepsis criteria with temperature of 101, respirations in the upper 20s, lactate noted be greater than 2 Plain film imaging of the left lower extremity - no concerns, (+)edema  ESR and sed rate --> no concerns  Rocephin, Flagyl - d/c (completion 5 days)  Panculture - NG thus far  Wound care consult - follow dressing instructions: Clean L lower medial leg ulcer with NS, apply silver hydrofiber (Aquacel AG [place] #[id number]) cut to fit wound bed every other day, cover with dry gauze or ABD pad. Wrap  leg with Kerlix roll gauze beginning just above toes and ending right below knee. Secure with Coban wrapped in same fashion as Kerlix for some compression. Moisten silver w/ NS to facilitate removal     Sepsis d/t cellulitis, PA, Completed abx Now w/  small area of questionable developing cellulitis vs peripheral vascular ulceration / stasis dermatitis - see photo Warm compresses Bacitracin topical  Keflex  AKI, mild  On CKD (chronic kidney disease), stage IIIa Likely related to diuresis Holding diuretics yesterday --> improved Cr  Repeat BMP in AM  Type 2 diabetes mellitus with diabetic chronic kidney disease (HCC) SSI A1c   Essential hypertension BP stable Monitor and meds as above    Elevated troponin Troponin 40s to 50s Suspect secondary mild demand ischemia in the setting of acute on chronic PE, acute on chronic HFpEF as well as sepsis Repeat if chest pain    Dizziness - resolved No chest pain/SOB, no neuro deficits  Meclizine prn Further w/u if persists    Morbid obesity based on BMI: Body mass index is 52.14 kg/m.  Complicates clinical prognosis   DVT prophylaxis: heparin/coumadin IV fluids: no continuous IV fluids  Nutrition: carb/cardiac Central lines / invasive devices: none  Code Status: FULL CODE ACP documentation reviewed: 06/21/23 and none on file in [facility]  TOC needs: TBD, expect may need to discharge on continuous O2 rather than hs  Barriers to dispo / significant pending items: monitoring following DCCV today, clear w/ cardiology prior to dc

## 2023-06-15 NOTE — Consult Note (Signed)
WOC Nurse Consult Note: patient at [facility] for lymphedema and venous ulcer; last seen 06/07/2023 when applied Hydrofera blue and compression wrap; [employer] does not have Hydrofera blue on formulary; Aquacel AG is substitute  Reason for Consult: Left lower leg ulcer  Wound type: full thickness r/t venous insufficiency/lymphedema  Pressure Injury POA: NA  Measurement: 2 cm x 1.5 cm 0.2 cm  Wound bed: 75% pink moist 25% yellow fibrin  Drainage (amount, consistency, odor) minimal tan exudate  Periwound: non-pitting edema, skin changes consistent with lymphedema  Dressing procedure/placement/frequency: Clean L lower medial leg ulcer with NS, apply silver hydrofiber (Aquacel [place] [id number]) cut to fit wound bed every other day, cover with dry gauze or ABD pad. Wrap leg with Kerlix roll gauze beginning just above toes and ending right below knee.  Secure with Coban wrapped in same fashion as Kerlix for some compression. MOISTEN SILVER WITH NORMAL SALINE IF STUCK TO WOUND BED FOR ATRAUMATIC REMOVAL.   Patient is to continue follow-up with wound care center.  Says he about to be fitted for compression wraps for home use.    POC discussed with patient and bedside nurse. WOC team will not follow.  Re-consult if further needs arise   Thank you,    Priscella Mann MSN, RN-BC, [employer] [phone]

## 2023-06-15 NOTE — Progress Notes (Signed)
Pharmacy Antibiotic Note  Ryan [name] is a 52 y.o. male w/ PMH of CHF, diabetes, HTN, multiple PEs s/p thrombectomy earlier this year on warfarin, CTEPH, 2L at night only  admitted on 06/14/2023 with pulmonary embolus, LLE chronic wound.  Pharmacy has been consulted for vancomycin dosing. He has received 1000 mg IV vancomycin x 1. His serum creatinine is elevated but not far from what appears to be his approximate baseline. Noted acute on chronic lower extremity wound with new ulcer formation and foul-smelling odor.   Plan: adjust vancomycin to 2000 mg IV every 24 hours Goal AUC 400-550. Expected AUC: 469.7 SCr used: 1.32 mg/dL   Height: 5\' 7"  (170.2 cm) Weight: (!) 155.6 kg (343 lb) IBW/kg (Calculated) : 66.1  Temp (24hrs), Avg:100.2 F (37.9 C), Min:97.8 F (36.6 C), Max:101.7 F (38.7 C)  Recent Labs  Lab 06/14/23 0630 06/14/23 0827 06/14/23 1044 06/15/23 0405  WBC 10.7*  --   --  6.8  CREATININE 1.56*  --   --  1.32*  LATICACIDVEN  --  2.5* 1.7  --     Estimated Creatinine Clearance: 93.3 mL/min (A) (by C-G formula based on SCr of 1.32 mg/dL (H)).    Allergies  Allergen Reactions   Metrizamide Other (See Comments)    Kidney failure requiring dialysis Kidney failure requiring dialysis    Clindamycin/Lincomycin Rash   Lincomycin Rash   Sulfa Antibiotics Rash    Antimicrobials this admission: 10/15 vancomycin >>  10/15 metronidazole 10/15 ceftriaxone >>   Microbiology results: 10/15 BCx: pending 10/15 MRSA PCR: not detected  Thank you for allowing pharmacy to be a part of this patient's care.  Lowella Bandy 06/15/2023 8:01 AM

## 2023-06-16 ENCOUNTER — Ambulatory Visit: Payer: No Typology Code available for payment source

## 2023-06-16 DIAGNOSIS — J9621 Acute and chronic respiratory failure with hypoxia: Secondary | ICD-10-CM | POA: Diagnosis not present

## 2023-06-16 DIAGNOSIS — I2699 Other pulmonary embolism without acute cor pulmonale: Secondary | ICD-10-CM | POA: Diagnosis not present

## 2023-06-16 LAB — HEPARIN LEVEL (UNFRACTIONATED)
Heparin Unfractionated: 0.16 [IU]/mL — ABNORMAL LOW (ref 0.30–0.70)
Heparin Unfractionated: 0.41 [IU]/mL (ref 0.30–0.70)
Heparin Unfractionated: 0.16 [IU]/mL — ABNORMAL LOW (ref 0.30–0.70)

## 2023-06-16 LAB — BASIC METABOLIC PANEL WITH GFR
Anion gap: 9 (ref 5–15)
BUN: 19 mg/dL (ref 6–20)
CO2: 22 mmol/L (ref 22–32)
Calcium: 8.2 mg/dL — ABNORMAL LOW (ref 8.9–10.3)
Chloride: 102 mmol/L (ref 98–111)
Creatinine, Ser: 1.28 mg/dL — ABNORMAL HIGH (ref 0.61–1.24)
GFR, Estimated: >60 mL/min
Glucose, Bld: 97 mg/dL (ref 70–99)
Potassium: 3.8 mmol/L (ref 3.5–5.1)
Sodium: 133 mmol/L — ABNORMAL LOW (ref 135–145)

## 2023-06-16 LAB — CBC
HCT: 41.8 % (ref 39.0–52.0)
Hemoglobin: 13.6 g/dL (ref 13.0–17.0)
MCH: 30.5 pg (ref 26.0–34.0)
MCHC: 32.5 g/dL (ref 30.0–36.0)
MCV: 93.7 fL (ref 80.0–100.0)
Platelets: 176 10*3/uL (ref 150–400)
RBC: 4.46 MIL/uL (ref 4.22–5.81)
RDW: 16.4 % — ABNORMAL HIGH (ref 11.5–15.5)
WBC: 4.4 10*3/uL (ref 4.0–10.5)
nRBC: 0 % (ref 0.0–0.2)

## 2023-06-16 LAB — PROTIME-INR
INR: 1.8 — ABNORMAL HIGH (ref 0.8–1.2)
Prothrombin Time: 20.7 s — ABNORMAL HIGH (ref 11.4–15.2)

## 2023-06-16 LAB — GLUCOSE, CAPILLARY
Glucose-Capillary: 124 mg/dL — ABNORMAL HIGH (ref 70–99)
Glucose-Capillary: 110 mg/dL — ABNORMAL HIGH (ref 70–99)
Glucose-Capillary: 95 mg/dL (ref 70–99)
Glucose-Capillary: 115 mg/dL — ABNORMAL HIGH (ref 70–99)

## 2023-06-16 LAB — MAGNESIUM: Magnesium: 1.8 mg/dL (ref 1.7–2.4)

## 2023-06-16 LAB — PHOSPHORUS: Phosphorus: 3.3 mg/dL (ref 2.5–4.6)

## 2023-06-16 MED ORDER — METRONIDAZOLE 500 MG/100ML IV SOLN
500.0000 mg | Freq: Two times a day (BID) | INTRAVENOUS | Status: AC
  Administered 2023-06-16 – 2023-06-18 (×5): 500 mg via INTRAVENOUS
  Filled 2023-06-16 (×5): qty 100

## 2023-06-16 MED ORDER — WARFARIN SODIUM 10 MG PO TABS
10.0000 mg | ORAL_TABLET | Freq: Once | ORAL | Status: AC
  Administered 2023-06-16: 10 mg via ORAL
  Filled 2023-06-16: qty 1

## 2023-06-16 MED ORDER — MAGNESIUM SULFATE 2 GM/50ML IV SOLN
2.0000 g | Freq: Once | INTRAVENOUS | Status: AC
  Administered 2023-06-16: 2 g via INTRAVENOUS
  Filled 2023-06-16: qty 50

## 2023-06-16 MED ORDER — SODIUM CHLORIDE 0.9 % IV SOLN
2.0000 g | INTRAVENOUS | Status: AC
  Administered 2023-06-16 – 2023-06-18 (×3): 2 g via INTRAVENOUS
  Filled 2023-06-16 (×3): qty 20

## 2023-06-16 MED ORDER — HEPARIN BOLUS VIA INFUSION
3000.0000 [IU] | Freq: Once | INTRAVENOUS | Status: AC
  Administered 2023-06-16: 3000 [IU] via INTRAVENOUS
  Filled 2023-06-16: qty 3000

## 2023-06-16 MED ORDER — HEPARIN BOLUS VIA INFUSION
3100.0000 [IU] | Freq: Once | INTRAVENOUS | Status: AC
  Administered 2023-06-16: 3100 [IU] via INTRAVENOUS
  Filled 2023-06-16: qty 3100

## 2023-06-16 MED ORDER — WARFARIN SODIUM 7.5 MG PO TABS
7.5000 mg | ORAL_TABLET | Freq: Once | ORAL | Status: DC
  Filled 2023-06-16: qty 1

## 2023-06-16 NOTE — Consult Note (Addendum)
Pharmacy Consult Note - Anticoagulation  Pharmacy Consult for heparin, warfarin Indication: pulmonary embolus (Subtherapeutic INR while on warfarin)  PATIENT MEASUREMENTS: Height: 5\' 7"  (170.2 cm) Weight: (!) 155.6 kg (343 lb) IBW/kg (Calculated) : 66.1 HEPARIN DW (KG): 104.5  VITAL SIGNS: Temp: 98.8 F (37.1 C) (10/17 0358) Temp Source: Oral (10/17 0358) BP: 126/85 (10/17 0358) Pulse Rate: 77 (10/17 0358)  Recent Labs    06/14/23 0807 06/14/23 1652 06/16/23 0437  HGB  --    < > 13.6  HCT  --    < > 41.8  PLT  --    < > 176  LABPROT  --   --  20.7*  INR  --   --  1.8*  HEPARINUNFRC  --    < > 0.16*  CREATININE  --    < > 1.28*  TROPONINIHS 53*  --   --    < > = values in this interval not displayed.    Estimated Creatinine Clearance: 96.2 mL/min (A) (by C-G formula based on SCr of 1.28 mg/dL (H)).  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  Regular the workup has not been forthcoming) previously followed by Dr. Isaiah Serge at [facility]   Hepatic steatosis    Hypertension    Morbid obesity with BMI of 50.0-59.9, adult (HCC)    Oxygen deficiency    Peripheral vascular disease (HCC)    Presence of IVC filter    Has 2 IVC filters with significant thrombus burden superior to the filter.   Pulmonary embolus (HCC) 12/2016   Initially just treated with anticoagulation, but in February 2023 in April 2024 treated with EKOS thrombectomy for submassive PE.   Ventricular trigeminy    Weakness of both lower limbs     ASSESSMENT: 52 y.o. male who presents to ED with shortness of breath requiring 2 liters on nasal  cannula oxygen at all times with oxygen saturations of 90% while on oxygen.  PMH includes CHF, diabetes, hypertension, multiple PEs status post thrombectomy earlier this year on warfarin.  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 Limited data with DOACs.    *Per Hx patient intolerant to Xarelto (Gi side-effects) and failed to Elqius - see Hematology note from 03/01/2022*  DDIs. tirzepatide PTA ---current: metronidazole, ceftriaxone, vancomycin  Pertinent medications: PTA Warfarin 5 mg daily  Goal(s) of therapy: Heparin level 0.3 - 0.7 units/mL INR 2 - 3 Monitor platelets by anticoagulation protocol: Yes   Baseline anticoagulation labs: Recent Labs    06/14/23 0630 06/15/23 0405 06/16/23 0437  INR 1.6*  --  1.8*  HGB 15.0 13.6 13.6  PLT 223 190 176   Heparin Date Time aPTT/HL Rate/Comment 10/15 0630   Baseline INR 1.6, start heparin infusion 10/15 1652 HL 0.46 Therapeutic x 1 10/15 2256 -- / 0.44 Therapeutic x 2 10/16 0405 -- / 0.42 Therapeutic x 3 10/17 0437 -- / 0.16 Subtherapeutic  Heparin PLAN: Bolus 3100 units x 1 Increase heparin infusion to 2000 units/hour. Recheck heparin level in 6 hrs after rate change Monitor CBC daily while  on heparin infusion.  Warfarin Plan Warfarin 7.5 mg po x 1 INR in am  Otelia Sergeant, PharmD, [facility] 06/16/2023 6:02 AM

## 2023-06-16 NOTE — Progress Notes (Signed)
PROGRESS NOTE    Ryan [name]   NWG:[id number] DOB: 10/30/1970  DOA: 06/14/2023 Date of Service: 06/16/23 which is hospital day 2  PCP: [name], NP    HPI: Ryan [name] is a 52 y.o. 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 Presented to ED 06/14/23 w/ respiratory distress, SOB. Of note, recent admission requiring mechanical thrombectomy 05/17/23, chronically on coumadin. No chest pain. Had mild cough, nausea/vomiting, diarrhea over few hours prior to arrival   Hospital course / significant events:  10/15: to ED, CTA chest (+)1 acute to subacute new PE in LUL, remainder of PE is chronic, chronic RV strain. Subtherapeutic INR 1.6. Requiring 4L O2 [place]. (+)chronic wound LLE w/ a foul odor, abx initiated. Concern for acute CHF. Vascular surgery consulted - continue heparin but no procedures planned for now, recs for diuresis and reevaluate. Hematology consulted - continue heparin bridging to coumadin w/ INR goal 2.5-3.  10/16: still tachypneic, on 4L O2. Cardiology following, increased diuresis. pharmacy following for coumadin bridging to goal INR .  10/17: BCx NGx2d. MRSA screen  neg, d/c vanc. INR still subtherapeutic, continue heparin  Consultants:  Vascular Surgery Hematology/Oncology  Cardiology    Procedures/Surgeries: none      ASSESSMENT & PLAN:   Acute on chronic respiratory failure with hypoxia Ddx: CHF exacerbation / R heart strain, acute on chronic PE but this is fairly mild, sepsis d/t LE wound, restrictive lung disease d/t habitus  Treating underlying causes as below  Supplemental O2    Acute on Chronic Pulmonary emboli  Hx recurrent PE and DVT, failed Xarelto and Eliquis, hypercoag w/u neg  Status post IVC x 2 On Coumadin, w/  Subtherapeutic INR, question adherence  Vascular surgery - no procedures planned Hematology following - heparin gtt w/ bridge to coumadin  INR goal 2.5-3  Acute on chronic HFrEF (heart failure with reduced ejection fraction)  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 2D echo 05/20/2023 with a EF of 60 to 65% As of 06/16/23 Net IO Since Admission: -1,247.88 mL [06/16/23 0936] Diuresis Lasix 40 mg IV bid  spironolactone 25 mg daily  Start Entresto 24/26 tomorrow  Cardiology following Will need outpatient R heart cath outpatient Will need sleep study outpatient  Continue other GDMT Strict ins and outs and daily weights   SIRS d/t PE/CHF, questions severe sepsis d/t acute on chronic lower extremity wound with new ulcer formation and foul-smelling odor Sepsis ruled out clinically, suspect SIRS d/t PE/CHF  Meeting SIRS vs severe sepsis criteria with temperature of 101, respirations in the upper 20s, lactate noted be greater than 2 Plain film imaging of the left lower extremity - no concerns, (+)edema  ESR and sed rate --> no concerns  Rocephin, Flagyl and vancomycin --> vanc d/c today  Panculture - NG thus far  Wound care consult - follow dressing instructions: Clean L lower medial leg ulcer with NS, apply silver hydrofiber (Aquacel [place] #[id number]) cut to fit wound bed every other day, cover with dry gauze or ABD pad. Wrap leg with Kerlix roll gauze beginning just above toes and ending right below knee. Secure with Coban wrapped in same fashion as Kerlix for some compression. Moisten silver w/ NS to facilitate removal  No IV fluids    Type 2 diabetes mellitus with diabetic chronic  kidney disease (HCC) SSI A1c   Essential hypertension BP stable Monitor with diuresis and concurrent acute on chronic PE with secondary heart strain   CKD (chronic kidney disease), stage IIIa Cr 1.5 w/ GFR in the 50s  Monitor w/ diuresis    Elevated troponin Troponin 40s to 50s Suspect  secondary mild demand ischemia in the setting of acute on chronic PE, acute on chronic HFpEF as well as sepsis On heparin drip Trend troponin Monitor      Morbid obesity based on BMI: Body mass index is 53.72 kg/m.  Complicates clinical prognosis   DVT prophylaxis: heparin/coumadin IV fluids: no continuous IV fluids  Nutrition: carb/cardiac Central lines / invasive devices: none  Code Status: FULL CODE ACP documentation reviewed: 06/15/23 and none on file in [facility]  TOC needs: TBD, expect may need to discharge on continuous O2 rather than hs  Barriers to dispo / significant pending items: heparin gtt bridging to coumadin, treating CHF              Subjective / Brief ROS:  Patient reports feeling good today Ambulating well  Denies CP/SOB.  Denies LE pain/edema, reports legs are at baseline  Pain controlled.  Denies new weakness.  Tolerating diet.  Reports no concerns w/ urination/defecation.   Family Communication: none at this time     Objective Findings:  Vitals:   06/16/23 0000 06/16/23 0358 06/16/23 0811 06/16/23 1217  BP:  126/85 121/81 (!) 145/96  Pulse: 72 77 79 86  Resp: (!) 21  16 16   Temp:  98.8 F (37.1 C) 97.9 F (36.6 C) 98.1 F (36.7 C)  TempSrc:  Oral  Oral  SpO2: (!) 86% 94% 94% 94%  Weight:      Height:        Intake/Output Summary (Last 24 hours) at 06/16/2023 1457 Last data filed at 06/16/2023 0200 Gross per 24 hour  Intake 587.88 ml  Output 1800 ml  Net -1212.12 ml   Filed Weights   06/14/23 0628  Weight: (!) 155.6 kg    Examination:  Physical Exam Constitutional:      General: He is not in acute distress.    Appearance: He is obese.  Cardiovascular:     Rate and Rhythm: Normal rate and regular rhythm.  Pulmonary:     Effort: Pulmonary effort is normal.     Breath sounds: Normal breath sounds.  Abdominal:     Tenderness: There is no abdominal tenderness.  Musculoskeletal:     Right lower leg: Edema present.      Left lower leg: Edema present.  Skin:    General: Skin is dry.  Neurological:     General: No focal deficit present.     Mental Status: He is alert and oriented to person, place, and time.  Psychiatric:        Mood and Affect: Mood normal.        Behavior: Behavior normal.          Scheduled Medications:   vitamin C  500 mg Oral BID   Chlorhexidine Gluconate Cloth  6 each Topical Daily   furosemide  40 mg Intravenous BID   insulin aspart  0-9 Units Subcutaneous TID WC   multivitamin with minerals  1 tablet Oral Daily   nutrition supplement (JUVEN)  1 packet Oral BID BM   Ensure Max Protein  11 oz Oral BID   spironolactone  25 mg Oral Daily   warfarin  10 mg Oral ONCE-1600  Warfarin - Pharmacist Dosing Inpatient   Does not apply q1600    Continuous Infusions:  cefTRIAXone (ROCEPHIN)  IV     And   metronidazole     heparin 2,000 Units/hr (06/16/23 0627)    PRN Medications:  mouth rinse  Antimicrobials from admission:  Anti-infectives (From admission, onward)    Start     Dose/Rate Route Frequency Ordered Stop   06/16/23 2200  metroNIDAZOLE (FLAGYL) IVPB 500 mg       Placed in "And" Linked Group   500 mg 100 mL/hr over 60 Minutes Intravenous Every 12 hours 06/16/23 1003 06/21/23 2159   06/16/23 1400  cefTRIAXone (ROCEPHIN) 2 g in sodium chloride 0.9 % 100 mL IVPB       Placed in "And" Linked Group   2 g 200 mL/hr over 30 Minutes Intravenous Every 24 hours 06/16/23 1003 06/21/23 1359   06/16/23 0600  vancomycin (VANCOREADY) IVPB 2000 mg/400 mL  Status:  Discontinued        2,000 mg 200 mL/hr over 120 Minutes Intravenous Every 24 hours 06/15/23 1342 06/16/23 0935   06/15/23 0600  vancomycin (VANCOREADY) IVPB 1750 mg/350 mL  Status:  Discontinued        1,750 mg 175 mL/hr over 120 Minutes Intravenous Every 24 hours 06/14/23 1316 06/15/23 1342   06/14/23 1500  vancomycin (VANCOREADY) IVPB 1500 mg/300 mL        1,500 mg 150 mL/hr over 120 Minutes Intravenous   Once 06/14/23 1316 06/14/23 1823   06/14/23 1400  cefTRIAXone (ROCEPHIN) 2 g in sodium chloride 0.9 % 100 mL IVPB  Status:  Discontinued       Placed in "And" Linked Group   2 g 200 mL/hr over 30 Minutes Intravenous Every 24 hours 06/14/23 1256 06/16/23 1003   06/14/23 1400  metroNIDAZOLE (FLAGYL) IVPB 500 mg  Status:  Discontinued       Placed in "And" Linked Group   500 mg 100 mL/hr over 60 Minutes Intravenous Every 8 hours 06/14/23 1256 06/16/23 1003   06/14/23 0900  vancomycin (VANCOCIN) IVPB 1000 mg/200 mL premix        1,000 mg 200 mL/hr over 60 Minutes Intravenous  Once 06/14/23 0858 06/14/23 1012           Data Reviewed:  I have personally reviewed the following...  CBC: Recent Labs  Lab 06/14/23 0630 06/15/23 0405 06/16/23 0437  WBC 10.7* 6.8 4.4  NEUTROABS 9.0*  --   --   HGB 15.0 13.6 13.6  HCT 46.3 41.5 41.8  MCV 93.9 93.0 93.7  PLT 223 190 176   Basic Metabolic Panel: Recent Labs  Lab 06/14/23 0630 06/15/23 0405 06/16/23 0437  NA 135 132* 133*  K 4.4 4.2 3.8  CL 102 103 102  CO2 23 21* 22  GLUCOSE 145* 114* 97  BUN 20 18 19   CREATININE 1.56* 1.32* 1.28*  CALCIUM 8.9 8.3* 8.2*  MG  --   --  1.8  PHOS  --   --  3.3   GFR: Estimated Creatinine Clearance: 96.2 mL/min (A) (by C-G formula based on SCr of 1.28 mg/dL (H)). Liver Function Tests: Recent Labs  Lab 06/15/23 0405  AST 15  ALT 15  ALKPHOS 48  BILITOT 1.2  PROT 7.3  ALBUMIN 3.2*   No results for input(s): "LIPASE", "AMYLASE" in the last 168 hours. No results for input(s): "AMMONIA" in the last 168 hours. Coagulation Profile: Recent Labs  Lab 06/14/23 0630 06/16/23 1610  INR 1.6* 1.8*   Cardiac Enzymes: No results for input(s): "CKTOTAL", "CKMB", "CKMBINDEX", "TROPONINI" in the last 168 hours. BNP (last 3 results) No results for input(s): "PROBNP" in the last 8760 hours. HbA1C: Recent Labs    06/14/23 0943  HGBA1C 6.1*   CBG: Recent Labs  Lab 06/15/23 1301  06/15/23 1542 06/15/23 2314 06/16/23 0844 06/16/23 1222  GLUCAP 132* 141* 73 124* 110*   Lipid Profile: No results for input(s): "CHOL", "HDL", "LDLCALC", "TRIG", "CHOLHDL", "LDLDIRECT" in the last 72 hours. Thyroid Function Tests: No results for input(s): "TSH", "T4TOTAL", "FREET4", "T3FREE", "THYROIDAB" in the last 72 hours. Anemia Panel: No results for input(s): "VITAMINB12", "FOLATE", "FERRITIN", "TIBC", "IRON", "RETICCTPCT" in the last 72 hours. Most Recent Urinalysis On File:     Component Value Date/Time   COLORURINE YELLOW (A) 06/14/2023 0827   APPEARANCEUR CLEAR (A) 06/14/2023 0827   APPEARANCEUR Clear 03/18/2022 1436   LABSPEC 1.037 (H) 06/14/2023 0827   LABSPEC 1.012 12/25/2014 1655   PHURINE 5.0 06/14/2023 0827   GLUCOSEU NEGATIVE 06/14/2023 0827   GLUCOSEU Negative 12/25/2014 1655   HGBUR NEGATIVE 06/14/2023 0827   BILIRUBINUR NEGATIVE 06/14/2023 0827   BILIRUBINUR Negative 03/18/2022 1436   BILIRUBINUR Negative 12/25/2014 1655   KETONESUR NEGATIVE 06/14/2023 0827   PROTEINUR >=300 (A) 06/14/2023 0827   NITRITE NEGATIVE 06/14/2023 0827   LEUKOCYTESUR NEGATIVE 06/14/2023 0827   LEUKOCYTESUR Negative 12/25/2014 1655   Sepsis Labs: @LABRCNTIP (procalcitonin:4,lacticidven:4) Microbiology: Recent Results (from the past 240 hour(s))  Resp panel by RT-PCR (RSV, Flu A&B, Covid) Anterior Nasal Swab     Status: None   Collection Time: 06/14/23  6:32 AM   Specimen: Anterior Nasal Swab  Result Value Ref Range Status   SARS Coronavirus 2 by RT PCR NEGATIVE NEGATIVE Final    Comment: (NOTE) SARS-CoV-2 target nucleic acids are NOT DETECTED.  The SARS-CoV-2 RNA is generally detectable in upper respiratory specimens during the acute phase of infection. The lowest concentration of SARS-CoV-2 viral copies this assay can detect is 138 copies/mL. A negative result does not preclude SARS-Cov-2 infection and should not be used as the sole basis for treatment or other patient  management decisions. A negative result may occur with  improper specimen collection/handling, submission of specimen other than nasopharyngeal swab, presence of viral mutation(s) within the areas targeted by this assay, and inadequate number of viral copies(<138 copies/mL). A negative result must be combined with clinical observations, patient history, and epidemiological information. The expected result is Negative.  Fact Sheet for Patients:  BloggerCourse.com  Fact Sheet for Healthcare Providers:  SeriousBroker.it  This test is no t yet approved or cleared by the [place] FDA and  has been authorized for detection and/or diagnosis of SARS-CoV-2 by FDA under an Emergency Use Authorization (EUA). This EUA will remain  in effect (meaning this test can be used) for the duration of the COVID-19 declaration under Section 564(b)(1) of the Act, 21 U.S.C.section 360bbb-3(b)(1), unless the authorization is terminated  or revoked sooner.       Influenza A by PCR NEGATIVE NEGATIVE Final   Influenza B by PCR NEGATIVE NEGATIVE Final    Comment: (NOTE) The Xpert Xpress SARS-CoV-2/FLU/RSV plus assay is intended as an aid in the diagnosis of influenza from Nasopharyngeal swab specimens and should not be used as a sole basis for treatment. Nasal washings and aspirates are unacceptable for Xpert Xpress SARS-CoV-2/FLU/RSV testing.  Fact Sheet for Patients: BloggerCourse.com  Fact Sheet for Healthcare Providers: SeriousBroker.it  This test is not yet  approved or cleared by the [place] and has been authorized for detection and/or diagnosis of SARS-CoV-2 by FDA under an Emergency Use Authorization (EUA). This EUA will remain in effect (meaning this test can be used) for the duration of the COVID-19 declaration under Section 564(b)(1) of the Act, 21 U.S.C. section 360bbb-3(b)(1),  unless the authorization is terminated or revoked.     Resp Syncytial Virus by PCR NEGATIVE NEGATIVE Final    Comment: (NOTE) Fact Sheet for Patients: BloggerCourse.com  Fact Sheet for Healthcare Providers: SeriousBroker.it  This test is not yet approved or cleared by the [place] FDA and has been authorized for detection and/or diagnosis of SARS-CoV-2 by FDA under an Emergency Use Authorization (EUA). This EUA will remain in effect (meaning this test can be used) for the duration of the COVID-19 declaration under Section 564(b)(1) of the Act, 21 U.S.C. section 360bbb-3(b)(1), unless the authorization is terminated or revoked.  Performed at [facility], [address] [place], [place] [address]   Blood Culture (routine x 2)     Status: None (Preliminary result)   Collection Time: 06/14/23  8:27 AM   Specimen: BLOOD  Result Value Ref Range Status   Specimen Description BLOOD RIGHT HAND  Final   Special Requests   Final    BOTTLES DRAWN AEROBIC AND ANAEROBIC Blood Culture adequate volume   Culture   Final    NO GROWTH 2 DAYS Performed at [facility], [address], [place], [place] [address]    Report Status PENDING  Incomplete  Blood Culture (routine x 2)     Status: None (Preliminary result)   Collection Time: 06/14/23  8:27 AM   Specimen: BLOOD  Result Value Ref Range Status   Specimen Description BLOOD RIGHT ARM  Final   Special Requests   Final    BOTTLES DRAWN AEROBIC AND ANAEROBIC Blood Culture adequate volume   Culture   Final    NO GROWTH 2 DAYS Performed at [facility], [address], [place], [place] [address]    Report Status PENDING  Incomplete  MRSA Next Gen by PCR, Nasal     Status: None   Collection Time: 06/14/23 10:45 AM   Specimen: Nasal Mucosa; Nasal Swab  Result Value Ref Range Status   MRSA by PCR Next Gen NOT DETECTED NOT DETECTED Final    Comment:  (NOTE) The GeneXpert MRSA Assay (FDA approved for NASAL specimens only), is one component of a comprehensive MRSA colonization surveillance program. It is not intended to diagnose MRSA infection nor to guide or monitor treatment for MRSA infections. Test performance is not FDA approved in patients less than 58 years old. Performed at [facility], [address], [place], [place] [address]       Radiology Studies last 3 days: DG Foot 2 Views Left  Result Date: 06/14/2023 CLINICAL DATA:  Cellulitis of the left foot. EXAM: LEFT FOOT - 2 VIEW COMPARISON:  None available. FINDINGS: There is no acute fracture or dislocation. The bones are osteopenic. No bone erosion or periosteal elevation. There is diffuse subcutaneous edema. No radiopaque foreign object or soft tissue gas. IMPRESSION: 1. No acute fracture or dislocation. 2. Diffuse subcutaneous edema. Electronically Signed   By: Elgie Collard M.D.   On: 06/14/2023 17:45   CT Angio Chest PE W and/or Wo Contrast  Result Date: 06/14/2023 CLINICAL DATA:  Shortness of breath. History of chronic pulmonary emboli. EXAM: CT ANGIOGRAPHY CHEST WITH CONTRAST TECHNIQUE: Multidetector  CT imaging of the chest was performed using the standard protocol during bolus administration of intravenous contrast. Multiplanar CT image reconstructions and MIPs were obtained to evaluate the vascular anatomy. RADIATION DOSE REDUCTION: This exam was performed according to the departmental dose-optimization program which includes automated exposure control, adjustment of the mA and/or kV according to patient size and/or use of iterative reconstruction technique. CONTRAST:  75mL OMNIPAQUE IOHEXOL 350 MG/ML SOLN COMPARISON:  05/16/2023 FINDINGS: Cardiovascular: Large chronic nonocclusive thrombus in the right main pulmonary artery. Small amount of nonocclusive thrombus extends into the lobar branches of the right upper and right lower lobes. Chronic peripheral  nonocclusive thrombus in the distal left main pulmonary artery extending into the left lower lobe lobar branch pulmonary artery. Small thrombus involving a segmental branch artery within the left upper lobe anteriorly is new from prior (series 5, image 93). Similar findings of chronic right heart strain with elevated RV to LV ratio of approximately 1.9. Persistently dilated pulmonary trunk measuring 3.7 cm. Heart size is normal. No pericardial effusion. Thoracic aorta is nonaneurysmal. Mediastinum/Nodes: No enlarged mediastinal, hilar, or axillary lymph nodes. Thyroid gland, trachea, and esophagus demonstrate no significant findings. Lungs/Pleura: No focal airspace consolidation. No evidence of pulmonary infarction. No pleural effusion or pneumothorax. Upper Abdomen: Reflux of contrast into the IVC and hepatic veins. IVC filter is partially imaged. No acute abnormality. Musculoskeletal: Numerous dilated venous collaterals along the anterior chest wall are again noted. No new or acute bony abnormality. Review of the MIP images confirms the above findings. IMPRESSION: 1. Examination is positive for acute on chronic PE. Large chronic nonocclusive thrombus in the right main pulmonary artery. Small chronic nonocclusive thrombus in the distal left main pulmonary artery. New small pulmonary embolus involving a segmental branch artery within the left upper lobe. Similar findings of chronic right heart strain with elevated RV to LV ratio of approximately 1.9. 2. Lungs are clear.  No evidence of pulmonary infarction. These results were called by telephone at the time of interpretation on 06/14/2023 at 8:52 am to provider Dr. Lenard Lance, who verbally acknowledged these results. Electronically Signed   By: Duanne Guess D.O.   On: 06/14/2023 08:53   DG Chest Portable 1 View  Result Date: 06/14/2023 CLINICAL DATA:  Shortness of breath.  Respiratory distress EXAM: PORTABLE CHEST 1 VIEW COMPARISON:  05/16/2023 FINDINGS:  Low volume chest. Central vascular congestion. Borderline heart size accentuated by technical factors. There is no edema, consolidation, effusion, or pneumothorax. Artifact from EKG leads. IMPRESSION: Low volume chest with congested appearance of central vessels. Electronically Signed   By: Tiburcio Pea M.D.   On: 06/14/2023 07:21        Sunnie Nielsen, DO [facility] 06/16/2023, 2:57 PM    Dictation software may have been used to generate the above note. Typos may occur and escape review in typed/dictated notes. Please contact Dr Lyn Hollingshead directly for clarity if needed.  Staff may message me via secure chat in Epic  but this may not receive an immediate response,  please page me for urgent matters!  If 7PM-7AM, please contact night coverage [email or web address]

## 2023-06-16 NOTE — Consult Note (Addendum)
Pharmacy Consult Note - Anticoagulation  Pharmacy Consult for heparin, warfarin Indication: pulmonary embolus (Subtherapeutic INR while on warfarin)  PATIENT MEASUREMENTS: Height: 5\' 7"  (170.2 cm) Weight: (!) 155.6 kg (343 lb) IBW/kg (Calculated) : 66.1 HEPARIN DW (KG): 104.5  VITAL SIGNS: Temp: 97.9 F (36.6 C) (10/17 0811) Temp Source: Oral (10/17 0358) BP: 121/81 (10/17 0811) Pulse Rate: 79 (10/17 0811)  Recent Labs    06/14/23 0807 06/14/23 1652 06/16/23 0437  HGB  --    < > 13.6  HCT  --    < > 41.8  PLT  --    < > 176  LABPROT  --   --  20.7*  INR  --   --  1.8*  HEPARINUNFRC  --    < > 0.16*  CREATININE  --    < > 1.28*  TROPONINIHS 53*  --   --    < > = values in this interval not displayed.    Estimated Creatinine Clearance: 96.2 mL/min (A) (by C-G formula based on SCr of 1.28 mg/dL (H)).  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  Regular the workup has not been forthcoming) previously followed by Dr. Isaiah Serge at [facility]   Hepatic steatosis    Hypertension    Morbid obesity with BMI of 50.0-59.9, adult (HCC)    Oxygen deficiency    Peripheral vascular disease (HCC)    Presence of IVC filter    Has 2 IVC filters with significant thrombus burden superior to the filter.   Pulmonary embolus (HCC) 12/2016   Initially just treated with anticoagulation, but in February 2023 in April 2024 treated with EKOS thrombectomy for submassive PE.   Ventricular trigeminy    Weakness of both lower limbs     ASSESSMENT: 52 y.o. male who presents to ED with shortness of breath requiring 2 liters on nasal  cannula oxygen at all times with oxygen saturations of 90% while on oxygen.  PMH includes CHF, diabetes, hypertension, multiple PEs status post thrombectomy earlier this year on warfarin.  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 Limited data with DOACs. Patient's large body mass can affect the absorption of warfarin potentially affect INR.  *Per Hx patient intolerant to Xarelto (Gi side-effects) and failed to Elqius - see Hematology note from 03/01/2022*  DDIs. tirzepatide PTA may affect absorption of warfarin Metronidazole can increase INR Ceftriaxone can increase INR  Pertinent medications: PTA Warfarin 5 mg daily  Goal(s) of therapy: Heparin level 0.3 - 0.7 units/mL INR 2.5 - 3 Monitor platelets by anticoagulation protocol: Yes   Baseline anticoagulation labs: Recent Labs    06/14/23 0630 06/15/23 0405 06/16/23 0437  INR 1.6*  --  1.8*  HGB 15.0 13.6 13.6  PLT 223 190 176   Heparin Date Time aPTT/HL Rate/Comment 10/15 0630   Baseline INR 1.6, start heparin infusion 10/15 1652 HL 0.46 Therapeutic x 1 10/15 2256 -- / 0.44 Therapeutic x 2 10/16 0405 -- / 0.42 Therapeutic x 3 10/17 0437 -- / 0.16 Subtherapeutic 10/17 1258 -- / 0.41 Therapeutic x  1  Warfarin Date Time INR Comment/Dose 10/15 0630 1.6 Subtherapeutic,  10/16 X X No INR, Warfarin 7.5 mg given 10/17 0437 1.8 Subtherapeutic  Heparin PLAN: Continue heparin infusion to 2000 units/hour. Recheck heparin level in 6 hrs at 2200 Monitor CBC daily while on heparin infusion.  Warfarin Plan INR subtherapeutic with increased dose, full effect of increased Warfarin dose from 10/16 should show tomorrow. Give Warfarin 10 mg today INR with AM labs CBC every 72 hours  Francetta Found, PharmD Candidate Class of 2025  06/16/2023 10:18 AM

## 2023-06-16 NOTE — Progress Notes (Signed)
    Advanced Heart Failure Rounding Note  PCP-Cardiologist: None   Subjective:     Feeling better this morning after diuresis, multiple urine jugs by the bedside.    Objective:   Weight Range: (!) 155.6 kg Body mass index is 53.72 kg/m.   Vital Signs:   Temp:  [97.8 F (36.6 C)-98.8 F (37.1 C)] 98.1 F (36.7 C) (10/17 1217) Pulse Rate:  [72-103] 86 (10/17 1217) Resp:  [9-31] 16 (10/17 1217) BP: (121-156)/(81-113) 145/96 (10/17 1217) SpO2:  [86 %-100 %] 94 % (10/17 1217) Last BM Date : 06/15/23  Weight change: Filed Weights   06/14/23 0628  Weight: (!) 155.6 kg    Intake/Output:   Intake/Output Summary (Last 24 hours) at 06/16/2023 1349 Last data filed at 06/16/2023 0200 Gross per 24 hour  Intake 587.88 ml  Output 1800 ml  Net -1212.12 ml      Physical Exam    General:  Chronically ill-appearing, morbidly obese HEENT: normal Cor: Not tachycardic, prominent P2, mildly elevated JVP Lungs: clear Abdomen: soft, nontender, distended.  Extremities: Chronic venous stasis and lymphedema of the lower extremities, poorly healing wound on the left lower extremity Neuro: alert & orientedx3, cranial nerves grossly intact. moves all 4 extremities w/o difficulty. Affect pleasant    Telemetry   Sinus in the 70s.  Patient Profile   OCEAN SCHILDT is a 52 y.o. 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 Improving right heart failure with IV diuresis.   - Continue lasix 40mg  IV BID - Continue spironolactone 25mg  daily - Start entresto 24/26 in  AM if stable - Probably limited role for repeat thrombectomy - Heparin gtt while INR subtherapeutic - Likely warfarin at discharge with higher INR target (2.5-3) - Will need outpatient RHC, hold for the time being given large clot burden - Sleep study at discharge - Autoimmune workup and hematology workup have been completed prior   Sepsis: Presented with fever, elevated lactate, likely source is nonhealing lower extremity wound. - Continue antibiotics per primary team   Length of Stay: 2  [name], MD  06/16/2023, 1:49 PM  Advanced Heart Failure Team Pager [phone] (M-F; 7a - 5p)  Please contact [facility] for night-coverage after hours (5p -7a ) and weekends on [email or web address]

## 2023-06-16 NOTE — Plan of Care (Signed)
  Problem: Education: Goal: Knowledge of General Education information will improve Description: Including pain rating scale, medication(s)/side effects and non-pharmacologic comfort measures Outcome: Progressing   Problem: Health Behavior/Discharge Planning: Goal: Ability to manage health-related needs will improve Outcome: Progressing   Problem: Clinical Measurements: Goal: Ability to maintain clinical measurements within normal limits will improve Outcome: Progressing Goal: Will remain free from infection Outcome: Progressing Goal: Diagnostic test results will improve Outcome: Progressing Goal: Respiratory complications will improve Outcome: Progressing Goal: Cardiovascular complication will be avoided Outcome: Progressing   Problem: Activity: Goal: Risk for activity intolerance will decrease Outcome: Progressing   Problem: Nutrition: Goal: Adequate nutrition will be maintained Outcome: Progressing   Problem: Coping: Goal: Level of anxiety will decrease Outcome: Progressing   Problem: Elimination: Goal: Will not experience complications related to bowel motility Outcome: Progressing Goal: Will not experience complications related to urinary retention Outcome: Progressing   Problem: Pain Managment: Goal: General experience of comfort will improve Outcome: Progressing   Problem: Safety: Goal: Ability to remain free from injury will improve Outcome: Progressing   Problem: Skin Integrity: Goal: Risk for impaired skin integrity will decrease Outcome: Progressing   Problem: Education: Goal: Ability to describe self-care measures that may prevent or decrease complications (Diabetes Survival Skills Education) will improve Outcome: Progressing Goal: Individualized Educational Video(s) Outcome: Progressing   Problem: Coping: Goal: Ability to adjust to condition or change in health will improve Outcome: Progressing   Problem: Fluid Volume: Goal: Ability to  maintain a balanced intake and output will improve Outcome: Progressing   Problem: Health Behavior/Discharge Planning: Goal: Ability to identify and utilize available resources and services will improve Outcome: Progressing Goal: Ability to manage health-related needs will improve Outcome: Progressing   Problem: Metabolic: Goal: Ability to maintain appropriate glucose levels will improve Outcome: Progressing   Problem: Nutritional: Goal: Maintenance of adequate nutrition will improve Outcome: Progressing Goal: Progress toward achieving an optimal weight will improve Outcome: Progressing   Problem: Skin Integrity: Goal: Risk for impaired skin integrity will decrease Outcome: Progressing   Problem: Tissue Perfusion: Goal: Adequacy of tissue perfusion will improve Outcome: Progressing   Problem: Education: Goal: Understanding of post-operative needs will improve Outcome: Progressing Goal: Individualized Educational Video(s) Outcome: Progressing   Problem: Clinical Measurements: Goal: Postoperative complications will be avoided or minimized Outcome: Progressing   Problem: Respiratory: Goal: Will regain and/or maintain adequate ventilation Outcome: Progressing

## 2023-06-16 NOTE — Consult Note (Addendum)
Pharmacy Consult Note - Anticoagulation  Pharmacy Consult for heparin, warfarin Indication: pulmonary embolus (Subtherapeutic INR while on warfarin)  PATIENT MEASUREMENTS: Height: 5\' 7"  (170.2 cm) Weight: (!) 155.6 kg (343 lb) IBW/kg (Calculated) : 66.1 HEPARIN DW (KG): 104.5  VITAL SIGNS: Temp: 98.7 F (37.1 C) (10/17 1535) Temp Source: Oral (10/17 1217) BP: 120/85 (10/17 1535) Pulse Rate: 83 (10/17 1535)  Recent Labs    06/14/23 0807 06/14/23 1652 06/16/23 0437 06/16/23 1258 06/16/23 1816  HGB  --    < > 13.6  --   --   HCT  --    < > 41.8  --   --   PLT  --    < > 176  --   --   LABPROT  --   --  20.7*  --   --   INR  --   --  1.8*  --   --   HEPARINUNFRC  --    < > 0.16*   < > 0.16*  CREATININE  --    < > 1.28*  --   --   TROPONINIHS 53*  --   --   --   --    < > = values in this interval not displayed.    Estimated Creatinine Clearance: 96.2 mL/min (A) (by C-G formula based on SCr of 1.28 mg/dL (H)).  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  Regular the workup has not been forthcoming) previously followed [facility] Isaiah [facility]   Hepatic steatosis    Hypertension    Morbid obesity with BMI of 50.0-59.9, adult (HCC)    Oxygen deficiency    Peripheral vascular disease (HCC)    Presence of IVC filter    Has 2 IVC filters with significant thrombus burden superior to the filter.   Pulmonary embolus (HCC) 12/2016   Initially just treated with anticoagulation, but in February 2023 in April 2024 treated with EKOS thrombectomy for submassive PE.   Ventricular trigeminy    Weakness of both lower  limbs     ASSESSMENT: 52 y.o. male who presents to ED with shortness of breath requiring 2 liters on nasal cannula oxygen at all times with oxygen saturations of 90% while on oxygen.  PMH includes CHF, diabetes, hypertension, multiple PEs status post thrombectomy earlier this year on warfarin.  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 Limited data with DOACs. Patient's large body mass can affect the absorption of warfarin potentially affect INR.  *Per Hx patient intolerant to Xarelto (Gi side-effects) and failed to Elqius - see Hematology note from 03/01/2022*  DDIs. tirzepatide PTA may affect absorption of warfarin Metronidazole can increase INR Ceftriaxone can increase INR  Pertinent medications: PTA Warfarin 5 mg daily  Goal(s) of therapy: Heparin level 0.3 - 0.7 units/mL INR 2.5 - 3 Monitor platelets by anticoagulation protocol: Yes   Baseline anticoagulation labs: Recent Labs    06/14/23 0630 06/15/23 0405 06/16/23 0437  INR 1.6*  --  1.8*  HGB 15.0 13.6 13.6  PLT 223 190 176  Heparin Date Time aPTT/HL Rate/Comment 10/15 0630   Baseline INR 1.6, start heparin infusion 10/15 1652 HL 0.46 Therapeutic x 1 10/15 2256 -- / 0.44 Therapeutic x 2 10/16 0405 -- / 0.42 Therapeutic x 3 10/17 0437 -- / 0.16 Subtherapeutic 10/17 1258 -- / 0.41 Therapeutic x  1 10/17 1816 -- / 0.16 Subtherapeutic  Warfarin Date Time INR Comment/Dose 10/15 0630 1.6 Subtherapeutic,  10/16 X X No INR, Warfarin 7.5 mg given 10/17 0437 1.8 Subtherapeutic  Heparin PLAN: Bolus 3,000 units Increase heparin infusion to 2200 units/hour. Recheck heparin level in 6 hrs Monitor CBC daily while on heparin infusion.  Elliot Gurney, PharmD, BCPS Clinical Pharmacist  06/16/2023 7:00 PM

## 2023-06-16 NOTE — Plan of Care (Signed)
Problem: Education: Goal: Knowledge of General Education information will improve Description: Including pain rating scale, medication(s)/side effects and non-pharmacologic comfort measures Outcome: Progressing   Problem: Clinical Measurements: Goal: Diagnostic test results will improve Outcome: Progressing Goal: Respiratory complications will improve Outcome: Progressing Goal: Cardiovascular complication will be avoided Outcome: Progressing   Problem: Activity: Goal: Risk for activity intolerance will decrease Outcome: Progressing   Problem: Safety: Goal: Ability to remain free from injury will improve Outcome: Progressing   Problem: Skin Integrity: Goal: Risk for impaired skin integrity will decrease Outcome: Progressing

## 2023-06-17 DIAGNOSIS — I2699 Other pulmonary embolism without acute cor pulmonale: Secondary | ICD-10-CM | POA: Diagnosis not present

## 2023-06-17 LAB — HEPARIN LEVEL (UNFRACTIONATED)
Heparin Unfractionated: 0.65 [IU]/mL (ref 0.30–0.70)
Heparin Unfractionated: 0.35 [IU]/mL (ref 0.30–0.70)

## 2023-06-17 LAB — BASIC METABOLIC PANEL
Anion gap: 11 (ref 5–15)
BUN: 19 mg/dL (ref 6–20)
CO2: 20 mmol/L — ABNORMAL LOW (ref 22–32)
Calcium: 8.5 mg/dL — ABNORMAL LOW (ref 8.9–10.3)
Chloride: 103 mmol/L (ref 98–111)
Creatinine, Ser: 1.29 mg/dL — ABNORMAL HIGH (ref 0.61–1.24)
GFR, Estimated: >60 mL/min (ref >60)
Glucose, Bld: 106 mg/dL — ABNORMAL HIGH (ref 70–99)
Potassium: 3.7 mmol/L (ref 3.5–5.1)
Sodium: 134 mmol/L — ABNORMAL LOW (ref 135–145)

## 2023-06-17 LAB — PROTIME-INR
INR: 2 — ABNORMAL HIGH (ref 0.8–1.2)
Prothrombin Time: 23.1 s — ABNORMAL HIGH (ref 11.4–15.2)

## 2023-06-17 LAB — CBC
HCT: 40.9 % (ref 39.0–52.0)
Hemoglobin: 13.6 g/dL (ref 13.0–17.0)
MCH: 30.9 pg (ref 26.0–34.0)
MCHC: 33.3 g/dL (ref 30.0–36.0)
MCV: 93 fL (ref 80.0–100.0)
Platelets: 207 10*3/uL (ref 150–400)
RBC: 4.4 MIL/uL (ref 4.22–5.81)
RDW: 16.2 % — ABNORMAL HIGH (ref 11.5–15.5)
WBC: 4.8 10*3/uL (ref 4.0–10.5)
nRBC: 0 % (ref 0.0–0.2)

## 2023-06-17 LAB — GLUCOSE, CAPILLARY
Glucose-Capillary: 101 mg/dL — ABNORMAL HIGH (ref 70–99)
Glucose-Capillary: 124 mg/dL — ABNORMAL HIGH (ref 70–99)
Glucose-Capillary: 150 mg/dL — ABNORMAL HIGH (ref 70–99)
Glucose-Capillary: 109 mg/dL — ABNORMAL HIGH (ref 70–99)
Glucose-Capillary: 137 mg/dL — ABNORMAL HIGH (ref 70–99)

## 2023-06-17 MED ORDER — TORSEMIDE 20 MG PO TABS
40.0000 mg | ORAL_TABLET | Freq: Every day | ORAL | Status: DC
  Administered 2023-06-17 – 2023-06-20 (×4): 40 mg via ORAL
  Filled 2023-06-17 (×4): qty 2

## 2023-06-17 MED ORDER — MECLIZINE HCL 25 MG PO TABS
25.0000 mg | ORAL_TABLET | Freq: Once | ORAL | Status: AC
  Administered 2023-06-17: 25 mg via ORAL
  Filled 2023-06-17: qty 1

## 2023-06-17 MED ORDER — WARFARIN SODIUM 10 MG PO TABS
10.0000 mg | ORAL_TABLET | Freq: Once | ORAL | Status: AC
  Administered 2023-06-17: 10 mg via ORAL
  Filled 2023-06-17: qty 1

## 2023-06-17 MED ORDER — SACUBITRIL-VALSARTAN 24-26 MG PO TABS
1.0000 | ORAL_TABLET | Freq: Two times a day (BID) | ORAL | Status: DC
  Administered 2023-06-17 – 2023-06-22 (×11): 1 via ORAL
  Filled 2023-06-17 (×11): qty 1

## 2023-06-17 NOTE — Consult Note (Signed)
Pharmacy Consult Note - Anticoagulation  Pharmacy Consult for heparin, warfarin Indication: pulmonary embolus (Subtherapeutic INR while on warfarin)  PATIENT MEASUREMENTS: Height: 5\' 7"  (170.2 cm) Weight: (!) 156 kg (343 lb 14.7 oz) IBW/kg (Calculated) : 66.1 HEPARIN DW (KG): 104.5  VITAL SIGNS: Temp: 97.7 F (36.5 C) (10/18 0744) Temp Source: Oral (10/18 0622) BP: 156/98 (10/18 0744) Pulse Rate: 89 (10/18 0744)  Recent Labs    06/17/23 0132 06/17/23 0846  HGB 13.6  --   HCT 40.9  --   PLT 207  --   LABPROT 23.1*  --   INR 2.0*  --   HEPARINUNFRC 0.65 0.35  CREATININE  --  1.29*    Estimated Creatinine Clearance: 95.6 mL/min (A) (by C-G formula based on SCr of 1.29 mg/dL (H)).  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  Regular the workup has not been forthcoming) previously followed by Dr. Isaiah Serge at [facility]   Hepatic steatosis    Hypertension    Morbid obesity with BMI of 50.0-59.9, adult (HCC)    Oxygen deficiency    Peripheral vascular disease (HCC)    Presence of IVC filter    Has 2 IVC filters with significant thrombus burden superior to the filter.   Pulmonary embolus (HCC) 12/2016   Initially just treated with anticoagulation, but in February 2023 in April 2024 treated with EKOS thrombectomy for submassive PE.   Ventricular trigeminy    Weakness of both lower limbs     ASSESSMENT: 52 y.o. male who presents to ED with shortness of breath requiring 2 liters on nasal cannula oxygen at all times with oxygen saturations of 90% while on oxygen.  PMH includes CHF, diabetes, hypertension, multiple  PEs status post thrombectomy earlier this year on warfarin.  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 Limited data with DOACs. Patient's large body mass can affect the absorption of warfarin potentially affect INR.  *Per Hx patient intolerant to Xarelto (Gi side-effects) and failed to Elqius - see Hematology note from 03/01/2022*  DDIs. tirzepatide PTA may affect absorption of warfarin Metronidazole can increase INR Ceftriaxone can increase INR  Pertinent medications: PTA Warfarin 5 mg daily  Goal(s) of therapy: Heparin level 0.3 - 0.7 units/mL INR 2.5 - 3 Monitor platelets by anticoagulation protocol: Yes   Baseline anticoagulation labs: Recent Labs    06/15/23 0405 06/16/23 0437 06/17/23 0132  INR  --  1.8* 2.0*  HGB 13.6 13.6 13.6  PLT 190 176 207   Heparin Date Time aPTT/HL Rate/Comment 10/15 0630   Baseline INR 1.6, start heparin infusion 10/15 1652 HL 0.46 Therapeutic x 1 10/15 2256 -- / 0.44 Therapeutic x 2 10/16 0405 -- / 0.42 Therapeutic x 3 10/17 0437 -- / 0.16 Subtherapeutic 10/17 1258 -- / 0.41 Therapeutic x  1 10/17 1816 -- / 0.16 Subtherapeutic 10/18 0132 -- / 0.65 Therapeutic x 1 10/18 0846 -- / 0.35 Therapeutic x 2  Warfarin Date Time INR Comment/Dose 10/15 0630 1.6 Subtherapeutic,  10/16 X X No INR, Warfarin 7.5 mg given 10/17 0437 1.8 Subtherapeutic,  10 mg given 10/18 0132 2.0 Subtherapeutic  Heparin PLAN: Continue heparin infusion at 2200 units/hour. Recheck heparin level with AM labs Monitor CBC daily while on heparin infusion.  Warfarin Plan INR subtherapeutic with increased dose, full effect of 10 mg Warfarin dose from 10/17 should show tomorrow.  Give Warfarin 10 mg today INR with AM labs CBC every 72 hours  Francetta Found, PharmD Candidate Class of 2025  06/17/2023 9:21 AM

## 2023-06-17 NOTE — Progress Notes (Signed)
PROGRESS NOTE    Ryan [name]   UVO:[id number] DOB: [birth year]  DOA: 06/14/2023 Date of Service: 06/17/23 which is hospital day 3  PCP: [name], NP    HPI: Ryan [name] is a 52 y.o. 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 Presented to ED 06/14/23 w/ respiratory distress, SOB. Of note, recent admission requiring mechanical thrombectomy 05/17/23, chronically on coumadin. No chest pain. Had mild cough, nausea/vomiting, diarrhea over few hours prior to arrival   Hospital course / significant events:  10/15: to ED, CTA chest (+)1 acute to subacute new PE in LUL, remainder of PE is chronic, chronic RV strain. Subtherapeutic INR 1.6. Requiring 4L O2 [place]. (+)chronic wound LLE w/ a foul odor, abx initiated. Concern for acute CHF. Vascular surgery consulted - continue heparin but no procedures planned for now, recs for diuresis and reevaluate. Hematology consulted - continue heparin bridging to coumadin w/ INR goal 2.5-3.  10/16: still tachypneic, on 4L O2. Cardiology following, increased diuresis. pharmacy following for coumadin bridging to goal INR .  10/17-10/18: BCx NG.  INR still subtherapeutic, continue heparin. Weaning off O2.   Consultants:  Vascular Surgery Hematology/Oncology  Cardiology    Procedures/Surgeries: none      ASSESSMENT & PLAN:   Acute on chronic respiratory failure with hypoxia Ddx: CHF exacerbation / R heart strain, acute on chronic PE but this is fairly mild, sepsis d/t LE wound, restrictive lung disease d/t habitus  Treating underlying causes as below  Supplemental O2    Acute on Chronic Pulmonary emboli  Hx recurrent PE and DVT, failed Xarelto and Eliquis, hypercoag w/u neg  Status post IVC x 2 On Coumadin, w/  Subtherapeutic INR, question adherence  Vascular surgery - no procedures planned Hematology following - heparin gtt w/ bridge to coumadin  INR goal 2.5-3  Acute on chronic HFrEF (heart failure with reduced ejection fraction)  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 2D echo 05/20/2023 with a EF of 60 to 65% As of 06/16/23 Net IO Since Admission: -1,247.88 mL [06/16/23 0936] D/c lasix, starting torsemide 40 mg daily  spironolactone 25 mg daily  Start Entresto 24/26  Hold metoprolol at discharge  Cardiology following Will need outpatient R heart cath  Will need outpatient sleep study  Strict ins and outs and daily weights   SIRS d/t PE/CHF, questions severe sepsis d/t acute on chronic lower extremity wound with new ulcer formation and foul-smelling odor Sepsis ruled out clinically, suspect SIRS d/t PE/CHF  Meeting SIRS vs severe sepsis criteria with temperature of 101, respirations in the upper 20s, lactate noted be greater than 2 Plain film imaging of the left lower extremity - no concerns, (+)edema  ESR and sed rate --> no concerns  Rocephin, Flagyl - can d/c after tomorrow (completion 5 days)  Panculture - NG thus far  Wound care consult - follow dressing instructions: Clean L lower medial leg ulcer with NS, apply silver hydrofiber (Aquacel [place] #[id number]) cut to fit wound bed every other day, cover with dry gauze or ABD pad. Wrap leg with Kerlix roll gauze beginning just above toes and ending right below knee. Secure with Coban wrapped in same fashion as Kerlix for some compression. Moisten silver w/ NS to facilitate removal     Type 2 diabetes mellitus with diabetic chronic  kidney disease (HCC) SSI A1c   Essential hypertension BP stable Monitor with diuresis and concurrent acute on chronic PE with secondary heart strain   CKD (chronic kidney disease), stage IIIa Cr 1.5 w/ GFR in the 50s  Monitor w/ diuresis    Elevated troponin Troponin 40s to 50s Suspect  secondary mild demand ischemia in the setting of acute on chronic PE, acute on chronic HFpEF as well as sepsis On heparin drip Monitor   Dizziness No chest pain/SOB, no neuro deficits  Suspect Rx effect - started torsemide and entresto today, BP ok and no orthostatic symptoms  Meclizine prn Further w/u if persists    Morbid obesity based on BMI: Body mass index is 53.72 kg/m.  Complicates clinical prognosis   DVT prophylaxis: heparin/coumadin IV fluids: no continuous IV fluids  Nutrition: carb/cardiac Central lines / invasive devices: none  Code Status: FULL CODE ACP documentation reviewed: 06/15/23 and none on file in [facility]  TOC needs: TBD, expect may need to discharge on continuous O2 rather than hs  Barriers to dispo / significant pending items: heparin gtt bridging to coumadin, treating CHF              Subjective / Brief ROS:  Patient reports feeling good today Ambulating well  Denies CP/SOB.  Denies LE pain/edema, reports legs are at baseline  Pain controlled.  Denies new weakness.  Tolerating diet.  Some dizziness, mild no focal weakness no vision change   Family Communication: none at this time     Objective Findings:  Vitals:   06/17/23 0622 06/17/23 0744 06/17/23 1157 06/17/23 1331  BP: 132/89 (!) 156/98 (!) 143/96 (!) 129/98  Pulse: 79 89 88 100  Resp: 20 16 18    Temp: 98.4 F (36.9 C) 97.7 F (36.5 C) 98.1 F (36.7 C)   TempSrc: Oral  Oral   SpO2: 95% 91% 96%   Weight: (!) 156 kg     Height:        Intake/Output Summary (Last 24 hours) at 06/17/2023 1509 Last data filed at 06/17/2023 1427 Gross per 24 hour  Intake 1236.6 ml  Output 2925 ml  Net -1688.4 ml   Filed Weights   06/14/23 0628 06/17/23 0622  Weight: (!) 155.6 kg (!) 156 kg    Examination:  Physical Exam Constitutional:      General: He is not in acute distress.    Appearance: He is obese.  Cardiovascular:     Rate and Rhythm: Normal rate and regular rhythm.   Pulmonary:     Effort: Pulmonary effort is normal.     Breath sounds: Normal breath sounds.  Abdominal:     Tenderness: There is no abdominal tenderness.  Musculoskeletal:     Right lower leg: Edema present.     Left lower leg: Edema present.  Skin:    General: Skin is dry.  Neurological:     General: No focal deficit present.     Mental Status: He is alert and oriented to person, place, and time.  Psychiatric:        Mood and Affect: Mood normal.        Behavior: Behavior normal.          Scheduled Medications:   vitamin C  500 mg Oral BID   Chlorhexidine Gluconate Cloth  6 each Topical Daily   insulin aspart  0-9 Units Subcutaneous TID WC   multivitamin with minerals  1 tablet Oral Daily   nutrition supplement (JUVEN)  1  packet Oral BID BM   Ensure Max Protein  11 oz Oral BID   sacubitril-valsartan  1 tablet Oral BID   spironolactone  25 mg Oral Daily   torsemide  40 mg Oral Daily   warfarin  10 mg Oral ONCE-1600   Warfarin - Pharmacist Dosing Inpatient   Does not apply q1600    Continuous Infusions:  cefTRIAXone (ROCEPHIN)  IV 2 g (06/17/23 1426)   And   metronidazole 500 mg (06/17/23 0955)   heparin 2,200 Units/hr (06/17/23 0748)    PRN Medications:  mouth rinse  Antimicrobials from admission:  Anti-infectives (From admission, onward)    Start     Dose/Rate Route Frequency Ordered Stop   06/16/23 2200  metroNIDAZOLE (FLAGYL) IVPB 500 mg       Placed in "And" Linked Group   500 mg 100 mL/hr over 60 Minutes Intravenous Every 12 hours 06/16/23 1003 06/21/23 2159   06/16/23 1400  cefTRIAXone (ROCEPHIN) 2 g in sodium chloride 0.9 % 100 mL IVPB       Placed in "And" Linked Group   2 g 200 mL/hr over 30 Minutes Intravenous Every 24 hours 06/16/23 1003 06/21/23 1359   06/16/23 0600  vancomycin (VANCOREADY) IVPB 2000 mg/400 mL  Status:  Discontinued        2,000 mg 200 mL/hr over 120 Minutes Intravenous Every 24 hours 06/15/23 1342 06/16/23 0935   06/15/23  0600  vancomycin (VANCOREADY) IVPB 1750 mg/350 mL  Status:  Discontinued        1,750 mg 175 mL/hr over 120 Minutes Intravenous Every 24 hours 06/14/23 1316 06/15/23 1342   06/14/23 1500  vancomycin (VANCOREADY) IVPB 1500 mg/300 mL        1,500 mg 150 mL/hr over 120 Minutes Intravenous  Once 06/14/23 1316 06/14/23 1823   06/14/23 1400  cefTRIAXone (ROCEPHIN) 2 g in sodium chloride 0.9 % 100 mL IVPB  Status:  Discontinued       Placed in "And" Linked Group   2 g 200 mL/hr over 30 Minutes Intravenous Every 24 hours 06/14/23 1256 06/16/23 1003   06/14/23 1400  metroNIDAZOLE (FLAGYL) IVPB 500 mg  Status:  Discontinued       Placed in "And" Linked Group   500 mg 100 mL/hr over 60 Minutes Intravenous Every 8 hours 06/14/23 1256 06/16/23 1003   06/14/23 0900  vancomycin (VANCOCIN) IVPB 1000 mg/200 mL premix        1,000 mg 200 mL/hr over 60 Minutes Intravenous  Once 06/14/23 0858 06/14/23 1012           Data Reviewed:  I have personally reviewed the following...  CBC: Recent Labs  Lab 06/14/23 0630 06/15/23 0405 06/16/23 0437 06/17/23 0132  WBC 10.7* 6.8 4.4 4.8  NEUTROABS 9.0*  --   --   --   HGB 15.0 13.6 13.6 13.6  HCT 46.3 41.5 41.8 40.9  MCV 93.9 93.0 93.7 93.0  PLT 223 190 176 207   Basic Metabolic Panel: Recent Labs  Lab 06/14/23 0630 06/15/23 0405 06/16/23 0437 06/17/23 0846  NA 135 132* 133* 134*  K 4.4 4.2 3.8 3.7  CL 102 103 102 103  CO2 23 21* 22 20*  GLUCOSE 145* 114* 97 106*  BUN 20 18 19 19   CREATININE 1.56* 1.32* 1.28* 1.29*  CALCIUM 8.9 8.3* 8.2* 8.5*  MG  --   --  1.8  --   PHOS  --   --  3.3  --  GFR: Estimated Creatinine Clearance: 95.6 mL/min (A) (by C-G formula based on SCr of 1.29 mg/dL (H)). Liver Function Tests: Recent Labs  Lab 06/15/23 0405  AST 15  ALT 15  ALKPHOS 48  BILITOT 1.2  PROT 7.3  ALBUMIN 3.2*   No results for input(s): "LIPASE", "AMYLASE" in the last 168 hours. No results for input(s): "AMMONIA" in the last  168 hours. Coagulation Profile: Recent Labs  Lab 06/14/23 0630 06/16/23 0437 06/17/23 0132  INR 1.6* 1.8* 2.0*   Cardiac Enzymes: No results for input(s): "CKTOTAL", "CKMB", "CKMBINDEX", "TROPONINI" in the last 168 hours. BNP (last 3 results) No results for input(s): "PROBNP" in the last 8760 hours. HbA1C: No results for input(s): "HGBA1C" in the last 72 hours.  CBG: Recent Labs  Lab 06/16/23 1540 06/16/23 2059 06/17/23 0743 06/17/23 1158 06/17/23 1329  GLUCAP 95 115* 101* 124* 150*   Lipid Profile: No results for input(s): "CHOL", "HDL", "LDLCALC", "TRIG", "CHOLHDL", "LDLDIRECT" in the last 72 hours. Thyroid Function Tests: No results for input(s): "TSH", "T4TOTAL", "FREET4", "T3FREE", "THYROIDAB" in the last 72 hours. Anemia Panel: No results for input(s): "VITAMINB12", "FOLATE", "FERRITIN", "TIBC", "IRON", "RETICCTPCT" in the last 72 hours. Most Recent Urinalysis On File:     Component Value Date/Time   COLORURINE YELLOW (A) 06/14/2023 0827   APPEARANCEUR CLEAR (A) 06/14/2023 0827   APPEARANCEUR Clear 03/18/2022 1436   LABSPEC 1.037 (H) 06/14/2023 0827   LABSPEC 1.012 12/25/2014 1655   PHURINE 5.0 06/14/2023 0827   GLUCOSEU NEGATIVE 06/14/2023 0827   GLUCOSEU Negative 12/25/2014 1655   HGBUR NEGATIVE 06/14/2023 0827   BILIRUBINUR NEGATIVE 06/14/2023 0827   BILIRUBINUR Negative 03/18/2022 1436   BILIRUBINUR Negative 12/25/2014 1655   KETONESUR NEGATIVE 06/14/2023 0827   PROTEINUR >=300 (A) 06/14/2023 0827   NITRITE NEGATIVE 06/14/2023 0827   LEUKOCYTESUR NEGATIVE 06/14/2023 0827   LEUKOCYTESUR Negative 12/25/2014 1655   Sepsis Labs: @LABRCNTIP (procalcitonin:4,lacticidven:4) Microbiology: Recent Results (from the past 240 hour(s))  Resp panel by RT-PCR (RSV, Flu A&B, Covid) Anterior Nasal Swab     Status: None   Collection Time: 06/14/23  6:32 AM   Specimen: Anterior Nasal Swab  Result Value Ref Range Status   SARS Coronavirus 2 by RT PCR NEGATIVE  NEGATIVE Final    Comment: (NOTE) SARS-CoV-2 target nucleic acids are NOT DETECTED.  The SARS-CoV-2 RNA is generally detectable in upper respiratory specimens during the acute phase of infection. The lowest concentration of SARS-CoV-2 viral copies this assay can detect is 138 copies/mL. A negative result does not preclude SARS-Cov-2 infection and should not be used as the sole basis for treatment or other patient management decisions. A negative result may occur with  improper specimen collection/handling, submission of specimen other than nasopharyngeal swab, presence of viral mutation(s) within the areas targeted by this assay, and inadequate number of viral copies(<138 copies/mL). A negative result must be combined with clinical observations, patient history, and epidemiological information. The expected result is Negative.  Fact Sheet for Patients:  BloggerCourse.com  Fact Sheet for Healthcare Providers:  SeriousBroker.it  This test is no t yet approved or cleared by the [place] FDA and  has been authorized for detection and/or diagnosis of SARS-CoV-2 by FDA under an Emergency Use Authorization (EUA). This EUA will remain  in effect (meaning this test can be used) for the duration of the COVID-19 declaration under Section 564(b)(1) of the Act, 21 U.S.C.section 360bbb-3(b)(1), unless the authorization is terminated  or revoked sooner.       Influenza A  by PCR NEGATIVE NEGATIVE Final   Influenza B by PCR NEGATIVE NEGATIVE Final    Comment: (NOTE) The Xpert Xpress SARS-CoV-2/FLU/RSV plus assay is intended as an aid in the diagnosis of influenza from Nasopharyngeal swab specimens and should not be used as a sole basis for treatment. Nasal washings and aspirates are unacceptable for Xpert Xpress SARS-CoV-2/FLU/RSV testing.  Fact Sheet for Patients: BloggerCourse.com  Fact Sheet for Healthcare  Providers: SeriousBroker.it  This test is not yet approved or cleared by the [place] FDA and has been authorized for detection and/or diagnosis of SARS-CoV-2 by FDA under an Emergency Use Authorization (EUA). This EUA will remain in effect (meaning this test can be used) for the duration of the COVID-19 declaration under Section 564(b)(1) of the Act, 21 U.S.C. section 360bbb-3(b)(1), unless the authorization is terminated or revoked.     Resp Syncytial Virus by PCR NEGATIVE NEGATIVE Final    Comment: (NOTE) Fact Sheet for Patients: BloggerCourse.com  Fact Sheet for Healthcare Providers: SeriousBroker.it  This test is not yet approved or cleared by the [place] FDA and has been authorized for detection and/or diagnosis of SARS-CoV-2 by FDA under an Emergency Use Authorization (EUA). This EUA will remain in effect (meaning this test can be used) for the duration of the COVID-19 declaration under Section 564(b)(1) of the Act, 21 U.S.C. section 360bbb-3(b)(1), unless the authorization is terminated or revoked.  Performed at [facility], [address] [place], [place] [address]   Blood Culture (routine x 2)     Status: None (Preliminary result)   Collection Time: 06/14/23  8:27 AM   Specimen: BLOOD  Result Value Ref Range Status   Specimen Description BLOOD RIGHT HAND  Final   Special Requests   Final    BOTTLES DRAWN AEROBIC AND ANAEROBIC Blood Culture adequate volume   Culture   Final    NO GROWTH 3 DAYS Performed at [facility], [address], [place], [place] [address]    Report Status PENDING  Incomplete  Blood Culture (routine x 2)     Status: None (Preliminary result)   Collection Time: 06/14/23  8:27 AM   Specimen: BLOOD  Result Value Ref Range Status   Specimen Description BLOOD RIGHT ARM  Final   Special Requests   Final    BOTTLES DRAWN AEROBIC AND  ANAEROBIC Blood Culture adequate volume   Culture   Final    NO GROWTH 3 DAYS Performed at [facility], [address] [place], [place] [address]    Report Status PENDING  Incomplete  MRSA Next Gen by PCR, Nasal     Status: None   Collection Time: 06/14/23 10:45 AM   Specimen: Nasal Mucosa; Nasal Swab  Result Value Ref Range Status   MRSA by PCR Next Gen NOT DETECTED NOT DETECTED Final    Comment: (NOTE) The GeneXpert MRSA Assay (FDA approved for NASAL specimens only), is one component of a comprehensive MRSA colonization surveillance program. It is not intended to diagnose MRSA infection nor to guide or monitor treatment for MRSA infections. Test performance is not FDA approved in patients less than 15 years old. Performed at [facility], [address], [place], [place] [address]       Radiology Studies last 3 days: DG Foot 2 Views Left  Result Date: 06/14/2023 CLINICAL DATA:  Cellulitis of the left foot. EXAM: LEFT FOOT - 2 VIEW COMPARISON:  None available. FINDINGS: There is no acute fracture or dislocation. The  bones are osteopenic. No bone erosion or periosteal elevation. There is diffuse subcutaneous edema. No radiopaque foreign object or soft tissue gas. IMPRESSION: 1. No acute fracture or dislocation. 2. Diffuse subcutaneous edema. Electronically Signed   By: Elgie Collard M.D.   On: 06/14/2023 17:45   CT Angio Chest PE W and/or Wo Contrast  Result Date: 06/14/2023 CLINICAL DATA:  Shortness of breath. History of chronic pulmonary emboli. EXAM: CT ANGIOGRAPHY CHEST WITH CONTRAST TECHNIQUE: Multidetector CT imaging of the chest was performed using the standard protocol during bolus administration of intravenous contrast. Multiplanar CT image reconstructions and MIPs were obtained to evaluate the vascular anatomy. RADIATION DOSE REDUCTION: This exam was performed according to the departmental dose-optimization program which includes automated exposure  control, adjustment of the mA and/or kV according to patient size and/or use of iterative reconstruction technique. CONTRAST:  75mL OMNIPAQUE IOHEXOL 350 MG/ML SOLN COMPARISON:  05/16/2023 FINDINGS: Cardiovascular: Large chronic nonocclusive thrombus in the right main pulmonary artery. Small amount of nonocclusive thrombus extends into the lobar branches of the right upper and right lower lobes. Chronic peripheral nonocclusive thrombus in the distal left main pulmonary artery extending into the left lower lobe lobar branch pulmonary artery. Small thrombus involving a segmental branch artery within the left upper lobe anteriorly is new from prior (series 5, image 93). Similar findings of chronic right heart strain with elevated RV to LV ratio of approximately 1.9. Persistently dilated pulmonary trunk measuring 3.7 cm. Heart size is normal. No pericardial effusion. Thoracic aorta is nonaneurysmal. Mediastinum/Nodes: No enlarged mediastinal, hilar, or axillary lymph nodes. Thyroid gland, trachea, and esophagus demonstrate no significant findings. Lungs/Pleura: No focal airspace consolidation. No evidence of pulmonary infarction. No pleural effusion or pneumothorax. Upper Abdomen: Reflux of contrast into the IVC and hepatic veins. IVC filter is partially imaged. No acute abnormality. Musculoskeletal: Numerous dilated venous collaterals along the anterior chest wall are again noted. No new or acute bony abnormality. Review of the MIP images confirms the above findings. IMPRESSION: 1. Examination is positive for acute on chronic PE. Large chronic nonocclusive thrombus in the right main pulmonary artery. Small chronic nonocclusive thrombus in the distal left main pulmonary artery. New small pulmonary embolus involving a segmental branch artery within the left upper lobe. Similar findings of chronic right heart strain with elevated RV to LV ratio of approximately 1.9. 2. Lungs are clear.  No evidence of pulmonary  infarction. These results were called by telephone at the time of interpretation on 06/14/2023 at 8:52 am to provider Dr. Lenard Lance, who verbally acknowledged these results. Electronically Signed   By: Duanne Guess D.O.   On: 06/14/2023 08:53   DG Chest Portable 1 View  Result Date: 06/14/2023 CLINICAL DATA:  Shortness of breath.  Respiratory distress EXAM: PORTABLE CHEST 1 VIEW COMPARISON:  05/16/2023 FINDINGS: Low volume chest. Central vascular congestion. Borderline heart size accentuated by technical factors. There is no edema, consolidation, effusion, or pneumothorax. Artifact from EKG leads. IMPRESSION: Low volume chest with congested appearance of central vessels. Electronically Signed   By: Tiburcio Pea M.D.   On: 06/14/2023 07:21        Sunnie Nielsen, DO [facility] 06/17/2023, 3:09 PM    Dictation software may have been used to generate the above note. Typos may occur and escape review in typed/dictated notes. Please contact Dr Lyn Hollingshead directly for clarity if needed.  Staff may message me via secure chat in Epic  but this may not receive an immediate response,  please page  me for urgent matters!  If 7PM-7AM, please contact night coverage [email or web address]

## 2023-06-17 NOTE — Consult Note (Signed)
Pharmacy Consult Note - Anticoagulation  Pharmacy Consult for heparin, warfarin Indication: pulmonary embolus (Subtherapeutic INR while on warfarin)  PATIENT MEASUREMENTS: Height: 5\' 7"  (170.2 cm) Weight: (!) 155.6 kg (343 lb) IBW/kg (Calculated) : 66.1 HEPARIN DW (KG): 104.5  VITAL SIGNS: Temp: 98.5 F (36.9 C) (10/17 2058) Temp Source: Oral (10/17 1610) BP: 124/88 (10/17 9604) Pulse Rate: 89 (10/17 2058)  Recent Labs    06/14/23 0807 06/14/23 1652 06/16/23 0437 06/16/23 1258 06/17/23 0132  HGB  --    < > 13.6  --  13.6  HCT  --    < > 41.8  --  40.9  PLT  --    < > 176  --  207  LABPROT  --   --  20.7*  --  23.1*  INR  --   --  1.8*  --  2.0*  HEPARINUNFRC  --    < > 0.16*   < > 0.65  CREATININE  --    < > 1.28*  --   --   TROPONINIHS 53*  --   --   --   --    < > = values in this interval not displayed.    Estimated Creatinine Clearance: 96.2 mL/min (A) (by C-G formula based on SCr of 1.28 mg/dL (H)).  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  Regular the workup has not been forthcoming) previously followed by Dr. Isaiah Serge at [facility]   Hepatic steatosis    Hypertension    Morbid obesity with BMI of 50.0-59.9, adult (HCC)    Oxygen deficiency    Peripheral vascular disease (HCC)    Presence of IVC filter    Has 2 IVC filters with significant thrombus burden superior to the filter.   Pulmonary embolus (HCC) 12/2016   Initially just treated with anticoagulation, but in February 2023 in April 2024 treated with EKOS thrombectomy for submassive PE.   Ventricular trigeminy    Weakness of both lower  limbs     ASSESSMENT: 52 y.o. male who presents to ED with shortness of breath requiring 2 liters on nasal cannula oxygen at all times with oxygen saturations of 90% while on oxygen.  PMH includes CHF, diabetes, hypertension, multiple PEs status post thrombectomy earlier this year on warfarin.  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 Limited data with DOACs. Patient's large body mass can affect the absorption of warfarin potentially affect INR.  *Per Hx patient intolerant to Xarelto (Gi side-effects) and failed to Elqius - see Hematology note from 03/01/2022*  DDIs. tirzepatide PTA may affect absorption of warfarin Metronidazole can increase INR Ceftriaxone can increase INR  Pertinent medications: PTA Warfarin 5 mg daily  Goal(s) of therapy: Heparin level 0.3 - 0.7 units/mL INR 2.5 - 3 Monitor platelets by anticoagulation protocol: Yes   Baseline anticoagulation labs: Recent Labs    06/14/23 0630 06/15/23 0405 06/16/23 0437 06/17/23 0132  INR 1.6*  --  1.8* 2.0*  HGB 15.0 13.6 13.6 13.6  PLT 223 190 176 207   Heparin Date Time aPTT/HL Rate/Comment  10/15 0630   Baseline INR 1.6, start heparin infusion 10/15 1652 HL 0.46 Therapeutic x 1 10/15 2256 -- / 0.44 Therapeutic x 2 10/16 0405 -- / 0.42 Therapeutic x 3 10/17 0437 -- / 0.16 Subtherapeutic 10/17 1258 -- / 0.41 Therapeutic x  1 10/17 1816 -- / 0.16 Subtherapeutic 10/18 0132 -- / 0.65 Therapeutic x 1  Warfarin Date Time INR Comment/Dose 10/15 0630 1.6 Subtherapeutic,  10/16 X X No INR, Warfarin 7.5 mg given 10/17 0437 1.8 Subtherapeutic 10/18 0132 2.0 Therapeutic x 1  Heparin PLAN: Continue heparin infusion at 2200 units/hour. Recheck heparin level in 6 hrs to confirm, then daily Monitor CBC daily while on heparin infusion.  Otelia Sergeant, PharmD, MBA 06/17/2023 2:45 AM

## 2023-06-17 NOTE — Plan of Care (Signed)
[facility] Tonsillectomy/Adenoidectomy, Postoperative PEDS care plan entered in error.

## 2023-06-17 NOTE — Plan of Care (Signed)
  Problem: Health Behavior/Discharge Planning: Goal: Ability to manage health-related needs will improve Outcome: Progressing   Problem: Nutrition: Goal: Adequate nutrition will be maintained Outcome: Progressing   Problem: Pain Managment: Goal: General experience of comfort will improve Outcome: Progressing   

## 2023-06-17 NOTE — Progress Notes (Signed)
    Advanced Heart Failure Rounding Note  PCP-Cardiologist: None   Subjective:     Sitting up by the side of the bed, respiratory rate back near baseline, patient feels much improved. INR 2.0.   Objective:   Weight Range: (!) 156 kg Body mass index is 53.87 kg/m.   Vital Signs:   Temp:  [97.7 F (36.5 C)-98.7 F (37.1 C)] 97.7 F (36.5 C) (10/18 0744) Pulse Rate:  [79-89] 89 (10/18 0744) Resp:  [16-20] 16 (10/18 0744) BP: (120-156)/(85-98) 156/98 (10/18 0744) SpO2:  [89 %-96 %] 91 % (10/18 0744) Weight:  [156 kg] 156 kg (10/18 0622) Last BM Date : 06/15/23  Weight change: Filed Weights   06/14/23 0628 06/17/23 0622  Weight: (!) 155.6 kg (!) 156 kg    Intake/Output:   Intake/Output Summary (Last 24 hours) at 06/17/2023 0937 Last data filed at 06/17/2023 0752 Gross per 24 hour  Intake 1236.6 ml  Output 1300 ml  Net -63.4 ml      Physical Exam    General:  Chronically ill-appearing, morbidly obese HEENT: normal Cor: Not tachycardic, prominent P2, mildly elevated JVP Lungs: clear, normal WOB Abdomen: soft, nontender, distended.  Extremities: Chronic venous stasis and lymphedema of the lower extremities, poorly healing wound on the left lower extremity Neuro: alert & orientedx3, cranial nerves grossly intact. moves all 4 extremities w/o difficulty. Affect pleasant    Telemetry   Sinus in the 70s.  Patient Profile   Ryan [name] is a 52 y.o. 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 Improving right heart failure with IV  diuresis. Awaiting therapeutic INR, transition to oral medications today. - Start torsemide 40mg  daily - Continue spironolactone 25mg  daily - Start entresto 24/26 - Hold metoprolol at discharge, no role in RHF - Heparin gtt while INR subtherapeutic - Warfarin at discharge with higher INR target (2.5-3) - Will need outpatient RHC, hold for the time being given large clot burden - Sleep study at discharge - Autoimmune workup and hematology workup have been completed prior - Follow up arranged if patient is weekend discharge   Sepsis: Presented with fever, elevated lactate, likely source is nonhealing lower extremity wound. WBC count normalized, improving. - Continue antibiotics per primary team   Length of Stay: 3  [name], MD  06/17/2023, 9:37 AM  Advanced Heart Failure Team Pager [phone] (M-F; 7a - 5p)  Please contact [facility] for night-coverage after hours (5p -7a ) and weekends on [email or web address]

## 2023-06-18 DIAGNOSIS — I4892 Unspecified atrial flutter: Secondary | ICD-10-CM

## 2023-06-18 DIAGNOSIS — I272 Pulmonary hypertension, unspecified: Secondary | ICD-10-CM

## 2023-06-18 LAB — CBC
HCT: 42.7 % (ref 39.0–52.0)
Hemoglobin: 13.8 g/dL (ref 13.0–17.0)
MCH: 30.1 pg (ref 26.0–34.0)
MCHC: 32.3 g/dL (ref 30.0–36.0)
MCV: 93 fL (ref 80.0–100.0)
Platelets: 224 10*3/uL (ref 150–400)
RBC: 4.59 MIL/uL (ref 4.22–5.81)
RDW: 16 % — ABNORMAL HIGH (ref 11.5–15.5)
WBC: 4.6 10*3/uL (ref 4.0–10.5)
nRBC: 0 % (ref 0.0–0.2)

## 2023-06-18 LAB — HEPARIN LEVEL (UNFRACTIONATED): Heparin Unfractionated: 0.5 [IU]/mL (ref 0.30–0.70)

## 2023-06-18 LAB — BASIC METABOLIC PANEL
Anion gap: 11 (ref 5–15)
BUN: 22 mg/dL — ABNORMAL HIGH (ref 6–20)
CO2: 25 mmol/L (ref 22–32)
Calcium: 8.2 mg/dL — ABNORMAL LOW (ref 8.9–10.3)
Chloride: 100 mmol/L (ref 98–111)
Creatinine, Ser: 1.3 mg/dL — ABNORMAL HIGH (ref 0.61–1.24)
GFR, Estimated: >60 mL/min (ref >60)
Glucose, Bld: 111 mg/dL — ABNORMAL HIGH (ref 70–99)
Potassium: 3.3 mmol/L — ABNORMAL LOW (ref 3.5–5.1)
Sodium: 136 mmol/L (ref 135–145)

## 2023-06-18 LAB — MAGNESIUM: Magnesium: 1.9 mg/dL (ref 1.7–2.4)

## 2023-06-18 LAB — GLUCOSE, CAPILLARY
Glucose-Capillary: 104 mg/dL — ABNORMAL HIGH (ref 70–99)
Glucose-Capillary: 145 mg/dL — ABNORMAL HIGH (ref 70–99)
Glucose-Capillary: 152 mg/dL — ABNORMAL HIGH (ref 70–99)
Glucose-Capillary: 130 mg/dL — ABNORMAL HIGH (ref 70–99)

## 2023-06-18 LAB — PROTIME-INR
INR: 2.2 — ABNORMAL HIGH (ref 0.8–1.2)
Prothrombin Time: 25.1 s — ABNORMAL HIGH (ref 11.4–15.2)

## 2023-06-18 MED ORDER — AMIODARONE HCL IN DEXTROSE 360-4.14 MG/200ML-% IV SOLN
60.0000 mg/h | INTRAVENOUS | Status: DC
  Administered 2023-06-18 (×2): 60 mg/h via INTRAVENOUS
  Filled 2023-06-18 (×2): qty 200

## 2023-06-18 MED ORDER — AMIODARONE LOAD VIA INFUSION
150.0000 mg | Freq: Once | INTRAVENOUS | Status: AC
  Administered 2023-06-18: 150 mg via INTRAVENOUS
  Filled 2023-06-18: qty 83.34

## 2023-06-18 MED ORDER — METOPROLOL TARTRATE 25 MG PO TABS
12.5000 mg | ORAL_TABLET | Freq: Four times a day (QID) | ORAL | Status: DC
  Administered 2023-06-18 – 2023-06-19 (×5): 12.5 mg via ORAL
  Filled 2023-06-18 (×5): qty 1

## 2023-06-18 MED ORDER — AMIODARONE HCL IN DEXTROSE 360-4.14 MG/200ML-% IV SOLN
30.0000 mg/h | INTRAVENOUS | Status: DC
  Administered 2023-06-18 – 2023-06-21 (×7): 30 mg/h via INTRAVENOUS
  Filled 2023-06-18 (×5): qty 200

## 2023-06-18 MED ORDER — LABETALOL HCL 5 MG/ML IV SOLN
10.0000 mg | Freq: Once | INTRAVENOUS | Status: AC
  Administered 2023-06-18: 10 mg via INTRAVENOUS

## 2023-06-18 MED ORDER — POTASSIUM CHLORIDE 20 MEQ PO PACK: 40.0000 meq | PACK | Freq: Two times a day (BID) | ORAL | Status: DC

## 2023-06-18 MED ORDER — LABETALOL HCL 5 MG/ML IV SOLN
INTRAVENOUS | Status: AC
  Filled 2023-06-18: qty 4

## 2023-06-18 MED ORDER — POTASSIUM CHLORIDE CRYS ER 20 MEQ PO TBCR
40.0000 meq | EXTENDED_RELEASE_TABLET | Freq: Once | ORAL | Status: AC
  Administered 2023-06-18: 40 meq via ORAL
  Filled 2023-06-18: qty 2

## 2023-06-18 MED ORDER — WARFARIN SODIUM 10 MG PO TABS
10.0000 mg | ORAL_TABLET | Freq: Once | ORAL | Status: AC
  Administered 2023-06-18: 10 mg via ORAL
  Filled 2023-06-18: qty 1

## 2023-06-18 MED ORDER — POTASSIUM CHLORIDE 20 MEQ PO PACK
40.0000 meq | PACK | Freq: Once | ORAL | Status: AC
  Administered 2023-06-18: 40 meq via ORAL
  Filled 2023-06-18: qty 2

## 2023-06-18 NOTE — TOC Progression Note (Signed)
Transition of Care [facility]) - Progression Note    Patient Details  Name: Ryan [name] MRN: [id number] Date of Birth: October 13, 1970  Transition of [facility]) CM/SW Contact  Bing Quarry, RN Phone Number: 06/18/2023, 4:02 PM  Clinical Narrative: 10/19: ON 06/15/23 wound care rn consult completed. Patient followed by outpatient wound care per WOC note, and was about to be fitted with compression wraps for home use. WOC team will not follow, re-consult if needed.   May need continuous home oxygen at discharge per provider notes .  Today had patient had significant event documented by provider of Aflutter RVR requiring Amiodarone gtt.  TOC to follow for disposition needs.   Gabriel Cirri MSN RN CM  Transitions of Care Department [facility] [phone] Weekends Only        Expected Discharge Plan and Services                                               Social Determinants of Health (SDOH) Interventions SDOH Screenings   Food Insecurity: No Food Insecurity (06/14/2023)  Housing: Low Risk  (06/14/2023)  Transportation Needs: No Transportation Needs (06/14/2023)  Utilities: Not At Risk (06/14/2023)  Depression (PHQ2-9): Low Risk  (06/02/2023)  Financial Resource Strain: Low Risk  (12/30/2022)  Physical Activity: Insufficiently Active (12/30/2022)  Social Connections: Unknown (12/30/2022)  Stress: No Stress Concern Present (12/30/2022)  Tobacco Use: Low Risk  (06/14/2023)    Readmission Risk Interventions     No data to display

## 2023-06-18 NOTE — Plan of Care (Signed)

## 2023-06-18 NOTE — Progress Notes (Signed)
PROGRESS NOTE    Ryan [name]   OZD:[id number] DOB: 1970/10/23  DOA: 06/14/2023 Date of Service: 06/18/23 which is hospital day 4  PCP: [name], NP    HPI: Ryan [name] is a 52 y.o. 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 Presented to ED 06/14/23 w/ respiratory distress, SOB. Of note, recent admission requiring mechanical thrombectomy 05/17/23, chronically on coumadin. No chest pain. Had mild cough, nausea/vomiting, diarrhea over few hours prior to arrival   Hospital course / significant events:  10/15: to ED, CTA chest (+)1 acute to subacute new PE in LUL, remainder of PE is chronic, chronic RV strain. Subtherapeutic INR 1.6. Requiring 4L O2 [place]. (+)chronic wound LLE w/ a foul odor, abx initiated. Concern for acute CHF. Vascular surgery consulted - continue heparin but no procedures planned for now, recs for diuresis and reevaluate. Hematology consulted - continue heparin bridging to coumadin w/ INR goal 2.5-3.  10/16: still tachypneic, on 4L O2. Cardiology following, increased diuresis. pharmacy following for coumadin bridging to goal INR .  10/17-10/18: BCx NG.  INR still subtherapeutic, continue heparin. Weaning off O2.  10/19: AFlutter RVR requiring amiodarone gtt, restarted beta blocker   Consultants:  Vascular Surgery Hematology/Oncology  Cardiology    Procedures/Surgeries: none      ASSESSMENT & PLAN:   Acute on chronic respiratory failure with hypoxia Ddx: CHF exacerbation / R heart strain, acute on chronic PE but this is fairly mild, sepsis d/t LE wound, restrictive lung disease d/t habitus  Treating underlying causes as below  Supplemental O2    Hx paroxysmal Afib Aflutter w/ RVR this morning 06/18/23  Given IV labetalol with  spontaneous conversion to sinus rhythm briefly but later on reverted to atrial flutter.  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 2D echo 05/20/2023 with a EF of 60 to 65% As of 06/18/23 Net IO Since Admission: -3,652.41 mL [06/18/23 1532] torsemide 40 mg daily  spironolactone 25 mg daily  Entresto 24/26  Initially planning to hold beta blocker d/t low BP but have restarted this given Aflutter see above - metoprolol 12.5 mg po q6h Cardiology following Will need outpatient R heart cath  Will need outpatient sleep study  Strict ins and outs and daily weights   SIRS d/t PE/CHF, questions severe sepsis d/t acute on chronic lower extremity wound with new ulcer formation and foul-smelling odor Sepsis ruled out clinically, suspect SIRS d/t PE/CHF  Meeting SIRS vs severe sepsis criteria with temperature of 101, respirations in the upper 20s, lactate noted be greater than 2 Plain film imaging of the left lower extremity - no concerns, (+)edema  ESR and sed rate --> no concerns  Rocephin, Flagyl - can d/c (completion 5 days)  Panculture - NG thus far  Wound care consult - follow dressing instructions: Clean L lower medial leg  ulcer with NS, apply silver hydrofiber (Aquacel [place] [phone]) cut to fit wound bed every other day, cover with dry gauze or ABD pad. Wrap leg with Kerlix roll gauze beginning just above toes and ending right below knee. Secure with Coban wrapped in same fashion as Kerlix for some compression.  Moisten silver w/ NS to facilitate removal     Type 2 diabetes mellitus with diabetic chronic kidney disease (HCC) SSI A1c   Essential hypertension BP stable Monitor with diuresis and concurrent acute on chronic PE with secondary heart strain   CKD (chronic kidney disease), stage IIIa Cr 1.5 w/ GFR in the 50s  Monitor w/ diuresis    Elevated troponin Troponin 40s to 50s Suspect secondary mild demand ischemia in the setting of acute on chronic PE, acute on chronic HFpEF as well as sepsis On heparin drip Monitor   Dizziness No chest pain/SOB, no neuro deficits  Suspect Rx effect - started torsemide and entresto today, BP ok and no orthostatic symptoms  Meclizine prn Further w/u if persists    Morbid obesity based on BMI: Body mass index is 53.87 kg/m.  Complicates clinical prognosis   DVT prophylaxis: heparin/coumadin IV fluids: no continuous IV fluids  Nutrition: carb/cardiac Central lines / invasive devices: none  Code Status: FULL CODE ACP documentation reviewed: 06/18/23 and none on file in [facility]  TOC needs: TBD, expect may need to discharge on continuous O2 rather than hs  Barriers to dispo / significant pending items: heparin gtt bridging to coumadin, treating CHF              Subjective / Brief ROS:  Patient reports feeling SOB today w/ rapid HR but better by time I saw him on rounds  Ambulating well  Denies CP/SOB.  Denies LE pain/edema, reports legs are at baseline  Pain controlled.  Denies new weakness.  Tolerating diet.   Family Communication: none at this time     Objective Findings:  Vitals:   06/18/23 1006 06/18/23 1046 06/18/23 1119 06/18/23 1140  BP: 91/72 (!) 85/73 (!) 81/64 95/70  Pulse:   (!) 130   Resp: (!) 26 (!) 22 20   Temp:   (!) 97.5 F (36.4 C)   TempSrc:   Oral   SpO2:      Weight:      Height:        Intake/Output Summary (Last 24 hours) at 06/18/2023 1534 Last data filed at 06/18/2023 0402 Gross per 24 hour   Intake 853.87 ml  Output 1570 ml  Net -716.13 ml   Filed Weights   06/14/23 0628 06/17/23 0622  Weight: (!) 155.6 kg (!) 156 kg    Examination:  Physical Exam Constitutional:      General: He is not in acute distress.    Appearance: He is obese.  Cardiovascular:     Rate and Rhythm: Normal rate and regular rhythm.  Pulmonary:     Effort: Pulmonary effort is normal.     Breath sounds: Normal breath sounds.  Abdominal:     Tenderness: There is no abdominal tenderness.  Musculoskeletal:     Right lower leg: Edema present.     Left lower leg: Edema present.  Skin:    General: Skin is dry.  Neurological:     General: No focal deficit present.     Mental Status: He is alert and oriented to person, place, and time.  Psychiatric:        Mood and Affect: Mood  normal.        Behavior: Behavior normal.          Scheduled Medications:   vitamin C  500 mg Oral BID   Chlorhexidine Gluconate Cloth  6 each Topical Daily   insulin aspart  0-9 Units Subcutaneous TID WC   metoprolol tartrate  12.5 mg Oral Q6H   multivitamin with minerals  1 tablet Oral Daily   nutrition supplement (JUVEN)  1 packet Oral BID BM   Ensure Max Protein  11 oz Oral BID   sacubitril-valsartan  1 tablet Oral BID   spironolactone  25 mg Oral Daily   torsemide  40 mg Oral Daily   warfarin  10 mg Oral ONCE-1600   Warfarin - Pharmacist Dosing Inpatient   Does not apply q1600    Continuous Infusions:  amiodarone 60 mg/hr (06/18/23 1332)   Followed by   amiodarone     heparin 2,200 Units/hr (06/18/23 1530)   metronidazole 500 mg (06/18/23 0935)    PRN Medications:  mouth rinse  Antimicrobials from admission:  Anti-infectives (From admission, onward)    Start     Dose/Rate Route Frequency Ordered Stop   06/16/23 2200  metroNIDAZOLE (FLAGYL) IVPB 500 mg       Placed in "And" Linked Group   500 mg 100 mL/hr over 60 Minutes Intravenous Every 12 hours 06/16/23 1003 06/18/23 2359   06/16/23 1400   cefTRIAXone (ROCEPHIN) 2 g in sodium chloride 0.9 % 100 mL IVPB       Placed in "And" Linked Group   2 g 200 mL/hr over 30 Minutes Intravenous Every 24 hours 06/16/23 1003 06/18/23 1401   06/16/23 0600  vancomycin (VANCOREADY) IVPB 2000 mg/400 mL  Status:  Discontinued        2,000 mg 200 mL/hr over 120 Minutes Intravenous Every 24 hours 06/15/23 1342 06/16/23 0935   06/15/23 0600  vancomycin (VANCOREADY) IVPB 1750 mg/350 mL  Status:  Discontinued        1,750 mg 175 mL/hr over 120 Minutes Intravenous Every 24 hours 06/14/23 1316 06/15/23 1342   06/14/23 1500  vancomycin (VANCOREADY) IVPB 1500 mg/300 mL        1,500 mg 150 mL/hr over 120 Minutes Intravenous  Once 06/14/23 1316 06/14/23 1823   06/14/23 1400  cefTRIAXone (ROCEPHIN) 2 g in sodium chloride 0.9 % 100 mL IVPB  Status:  Discontinued       Placed in "And" Linked Group   2 g 200 mL/hr over 30 Minutes Intravenous Every 24 hours 06/14/23 1256 06/16/23 1003   06/14/23 1400  metroNIDAZOLE (FLAGYL) IVPB 500 mg  Status:  Discontinued       Placed in "And" Linked Group   500 mg 100 mL/hr over 60 Minutes Intravenous Every 8 hours 06/14/23 1256 06/16/23 1003   06/14/23 0900  vancomycin (VANCOCIN) IVPB 1000 mg/200 mL premix        1,000 mg 200 mL/hr over 60 Minutes Intravenous  Once 06/14/23 0858 06/14/23 1012           Data Reviewed:  I have personally reviewed the following...  CBC: Recent Labs  Lab 06/14/23 0630 06/15/23 0405 06/16/23 0437 06/17/23 0132 06/18/23 0326  WBC 10.7* 6.8 4.4 4.8 4.6  NEUTROABS 9.0*  --   --   --   --   HGB 15.0 13.6 13.6 13.6 13.8  HCT 46.3 41.5 41.8 40.9 42.7  MCV 93.9 93.0 93.7 93.0 93.0  PLT 223 190 176 207 224  Basic Metabolic Panel: Recent Labs  Lab 06/14/23 0630 06/15/23 0405 06/16/23 0437 06/17/23 0846 06/18/23 0326  NA 135 132* 133* 134* 136  K 4.4 4.2 3.8 3.7 3.3*  CL 102 103 102 103 100  CO2 23 21* 22 20* 25  GLUCOSE 145* 114* 97 106* 111*  BUN 20 18 19 19  22*   CREATININE 1.56* 1.32* 1.28* 1.29* 1.30*  CALCIUM 8.9 8.3* 8.2* 8.5* 8.2*  MG  --   --  1.8  --  1.9  PHOS  --   --  3.3  --   --    GFR: Estimated Creatinine Clearance: 94.9 mL/min (A) (by C-G formula based on SCr of 1.3 mg/dL (H)). Liver Function Tests: Recent Labs  Lab 06/15/23 0405  AST 15  ALT 15  ALKPHOS 48  BILITOT 1.2  PROT 7.3  ALBUMIN 3.2*   No results for input(s): "LIPASE", "AMYLASE" in the last 168 hours. No results for input(s): "AMMONIA" in the last 168 hours. Coagulation Profile: Recent Labs  Lab 06/14/23 0630 06/16/23 0437 06/17/23 0132 06/18/23 0326  INR 1.6* 1.8* 2.0* 2.2*   Cardiac Enzymes: No results for input(s): "CKTOTAL", "CKMB", "CKMBINDEX", "TROPONINI" in the last 168 hours. BNP (last 3 results) No results for input(s): "PROBNP" in the last 8760 hours. HbA1C: No results for input(s): "HGBA1C" in the last 72 hours.  CBG: Recent Labs  Lab 06/17/23 1329 06/17/23 1614 06/17/23 1920 06/18/23 0739 06/18/23 1122  GLUCAP 150* 109* 137* 104* 145*   Lipid Profile: No results for input(s): "CHOL", "HDL", "LDLCALC", "TRIG", "CHOLHDL", "LDLDIRECT" in the last 72 hours. Thyroid Function Tests: No results for input(s): "TSH", "T4TOTAL", "FREET4", "T3FREE", "THYROIDAB" in the last 72 hours. Anemia Panel: No results for input(s): "VITAMINB12", "FOLATE", "FERRITIN", "TIBC", "IRON", "RETICCTPCT" in the last 72 hours. Most Recent Urinalysis On File:     Component Value Date/Time   COLORURINE YELLOW (A) 06/14/2023 0827   APPEARANCEUR CLEAR (A) 06/14/2023 0827   APPEARANCEUR Clear 03/18/2022 1436   LABSPEC 1.037 (H) 06/14/2023 0827   LABSPEC 1.012 12/25/2014 1655   PHURINE 5.0 06/14/2023 0827   GLUCOSEU NEGATIVE 06/14/2023 0827   GLUCOSEU Negative 12/25/2014 1655   HGBUR NEGATIVE 06/14/2023 0827   BILIRUBINUR NEGATIVE 06/14/2023 0827   BILIRUBINUR Negative 03/18/2022 1436   BILIRUBINUR Negative 12/25/2014 1655   KETONESUR NEGATIVE 06/14/2023  0827   PROTEINUR >=300 (A) 06/14/2023 0827   NITRITE NEGATIVE 06/14/2023 0827   LEUKOCYTESUR NEGATIVE 06/14/2023 0827   LEUKOCYTESUR Negative 12/25/2014 1655   Sepsis Labs: @LABRCNTIP (procalcitonin:4,lacticidven:4) Microbiology: Recent Results (from the past 240 hour(s))  Resp panel by RT-PCR (RSV, Flu A&B, Covid) Anterior Nasal Swab     Status: None   Collection Time: 06/14/23  6:32 AM   Specimen: Anterior Nasal Swab  Result Value Ref Range Status   SARS Coronavirus 2 by RT PCR NEGATIVE NEGATIVE Final    Comment: (NOTE) SARS-CoV-2 target nucleic acids are NOT DETECTED.  The SARS-CoV-2 RNA is generally detectable in upper respiratory specimens during the acute phase of infection. The lowest concentration of SARS-CoV-2 viral copies this assay can detect is 138 copies/mL. A negative result does not preclude SARS-Cov-2 infection and should not be used as the sole basis for treatment or other patient management decisions. A negative result may occur with  improper specimen collection/handling, submission of specimen other than nasopharyngeal swab, presence of viral mutation(s) within the areas targeted by this assay, and inadequate number of viral copies(<138 copies/mL). A negative result must be combined with clinical  observations, patient history, and epidemiological information. The expected result is Negative.  Fact Sheet for Patients:  BloggerCourse.com  Fact Sheet for Healthcare Providers:  SeriousBroker.it  This test is no t yet approved or cleared by the [place] FDA and  has been authorized for detection and/or diagnosis of SARS-CoV-2 by FDA under an Emergency Use Authorization (EUA). This EUA will remain  in effect (meaning this test can be used) for the duration of the COVID-19 declaration under Section 564(b)(1) of the Act, 21 U.S.C.section 360bbb-3(b)(1), unless the authorization is terminated  or revoked  sooner.       Influenza A by PCR NEGATIVE NEGATIVE Final   Influenza B by PCR NEGATIVE NEGATIVE Final    Comment: (NOTE) The Xpert Xpress SARS-CoV-2/FLU/RSV plus assay is intended as an aid in the diagnosis of influenza from Nasopharyngeal swab specimens and should not be used as a sole basis for treatment. Nasal washings and aspirates are unacceptable for Xpert Xpress SARS-CoV-2/FLU/RSV testing.  Fact Sheet for Patients: BloggerCourse.com  Fact Sheet for Healthcare Providers: SeriousBroker.it  This test is not yet approved or cleared by the [place] FDA and has been authorized for detection and/or diagnosis of SARS-CoV-2 by FDA under an Emergency Use Authorization (EUA). This EUA will remain in effect (meaning this test can be used) for the duration of the COVID-19 declaration under Section 564(b)(1) of the Act, 21 U.S.C. section 360bbb-3(b)(1), unless the authorization is terminated or revoked.     Resp Syncytial Virus by PCR NEGATIVE NEGATIVE Final    Comment: (NOTE) Fact Sheet for Patients: BloggerCourse.com  Fact Sheet for Healthcare Providers: SeriousBroker.it  This test is not yet approved or cleared by the [place] FDA and has been authorized for detection and/or diagnosis of SARS-CoV-2 by FDA under an Emergency Use Authorization (EUA). This EUA will remain in effect (meaning this test can be used) for the duration of the COVID-19 declaration under Section 564(b)(1) of the Act, 21 U.S.C. section 360bbb-3(b)(1), unless the authorization is terminated or revoked.  Performed at [facility], [address] [place], [place] [address]   Blood Culture (routine x 2)     Status: None (Preliminary result)   Collection Time: 06/14/23  8:27 AM   Specimen: BLOOD  Result Value Ref Range Status   Specimen Description BLOOD RIGHT HAND  Final   Special  Requests   Final    BOTTLES DRAWN AEROBIC AND ANAEROBIC Blood Culture adequate volume   Culture   Final    NO GROWTH 4 DAYS Performed at [facility], [address], [place], [place] [address]    Report Status PENDING  Incomplete  Blood Culture (routine x 2)     Status: None (Preliminary result)   Collection Time: 06/14/23  8:27 AM   Specimen: BLOOD  Result Value Ref Range Status   Specimen Description BLOOD RIGHT ARM  Final   Special Requests   Final    BOTTLES DRAWN AEROBIC AND ANAEROBIC Blood Culture adequate volume   Culture   Final    NO GROWTH 4 DAYS Performed at [facility], [address] [place], [place] [address]    Report Status PENDING  Incomplete  MRSA Next Gen by PCR, Nasal     Status: None   Collection Time: 06/14/23 10:45 AM   Specimen: Nasal Mucosa; Nasal Swab  Result Value Ref Range Status   MRSA by PCR Next Gen NOT DETECTED NOT DETECTED Final    Comment: (NOTE) The GeneXpert MRSA  Assay (FDA approved for NASAL specimens only), is one component of a comprehensive MRSA colonization surveillance program. It is not intended to diagnose MRSA infection nor to guide or monitor treatment for MRSA infections. Test performance is not FDA approved in patients less than 34 years old. Performed at [facility], [address], [place], [place] [address]       Radiology Studies last 3 days: No results found.      Sunnie Nielsen, DO [facility] 06/18/2023, 3:34 PM    Dictation software may have been used to generate the above note. Typos may occur and escape review in typed/dictated notes. Please contact Dr Lyn Hollingshead directly for clarity if needed.  Staff may message me via secure chat in Epic  but this may not receive an immediate response,  please page me for urgent matters!  If 7PM-7AM, please contact night coverage [email or web address]

## 2023-06-18 NOTE — Progress Notes (Signed)
Patient Name: Ryan [name] Date of Encounter: 06/18/2023 [facility] Cardiologist: None   Interval Summary  .    Patient seen on a.m. rounds.  Denies any chest pain.  Is visibly short of breath after walking to the restroom this morning without his oxygen on.  Heart rate upwards of 220 bpm EKG completed revealing atrial flutter.  After nursing assisted patient back to the bed he was given 10 mg of IV labetalol that had ordered by Dr. Lyn Hollingshead heart rate came down from 180-140 where he continued to stay.  He was started on metoprolol 12.5 mg every 6 hours after his initial dose of labetalol.  Further reports from nursing revealed heart rate continued to remain 140s and 150s and the patient remains short of breath.  Started on amiodarone bolus to be followed by drip.  He has -2.3 L output in the last 24 hours  Vital Signs .    Vitals:   06/18/23 0734 06/18/23 0738 06/18/23 0933 06/18/23 1006  BP:  112/79 102/67 91/72  Pulse:   (!) 144   Resp: (!) 25 18  (!) 26  Temp:  (!) 97.4 F (36.3 C)    TempSrc:  Oral    SpO2:  95%    Weight:      Height:        Intake/Output Summary (Last 24 hours) at 06/18/2023 1021 Last data filed at 06/18/2023 0402 Gross per 24 hour  Intake 853.87 ml  Output 3195 ml  Net -2341.13 ml      06/17/2023    6:22 AM 06/14/2023    6:28 AM 06/02/2023    8:32 AM  Last 3 Weights  Weight (lbs) 343 lb 14.7 oz 343 lb 342 lb  Weight (kg) 156 kg 155.584 kg 155.13 kg      Telemetry/ECG    A flutter with varying block rates 140s and 150s- Personally Reviewed  Physical Exam .   GEN: In mild distress  Neck: unable to assess JVD due to body habitus Cardiac: IR IR, no murmurs, rubs, or gallops.  Respiratory: Clear to auscultation bilaterally.  Tachypneic using assessor muscles placed back on oxygen therapy GI: Soft, nontender, obese, non-distended  MS: Chronic venous stasis with lymphedema of the lower extremities he has a wound to the left lower  extremity that is chronic  Assessment & Plan .     Atrial fibrillation RVR/atrial flutter -Patient ambulating at the bedside this morning without O2 and went into atrial fibrillation atrial flutter RVR with rates upward to 180s to over 200 he became extremely tachypneic -Originally treated with 10 mg of IVP labetalol with heart rate dropping to 160 and 140 -Placed back in bed on oxygen therapy with metoprolol 12.5 mg every 6 hours for concern of low blood pressure to help with rate control as PTA he was on Toprol-XL -With continued complaints of shortness of breath and heart rate increasing back up over 120 started on IV amiodarone bolus and drip -Continue with telemetry monitoring -Continue with IV heparin infusion and transition to warfarin for CHA2DS2-VASc score of at least 4 for stroke prophylaxis  Acute on chronic hypoxic respiratory failure with recurrent pulmonary embolism -Improving right heart failure with IV diuresis -On IV heparin infusion awaiting therapeutic INR to transition to oral medications -Continued on torsemide 40 mg daily, spironolactone 25 mg daily, Entresto 24/26 mg twice daily -Will need outpatient right heart catheterization but is currently on hold given large clot burden from PE -Sleep study  on discharge -Autoimmune workup and hematology workup been completed prior to hospitalization -Continues to be followed by advanced heart failure  Sepsis -Presented with fever, elevated lactate, likely source of nonhealing lower extremity wound -WBCs and fever have been trended and normalizing -Antibiotics continued per primary team    CHA2DS2-VASc Score = 4   This indicates a 4.8% annual risk of stroke. The patient's score is based upon: CHF History: 1 HTN History: 1 Diabetes History: 1 Stroke History: 0 Vascular Disease History: 1 Age Score: 0 Gender Score: 0     For questions or updates, please contact [facility] Please consult [email or web address] for  contact info under        Signed, Shadiyah Wernli, NP

## 2023-06-18 NOTE — Progress Notes (Signed)
Removed bandage on LLE to perform dressing change. Upon removal, there were no open wounds. The skin on the LLE is dry, flaky, and the existing wound is scabbed over. Extremity was cleansed with sterile saline from the knee to the ankle and no dressing was replaced at this time.

## 2023-06-18 NOTE — Consult Note (Addendum)
Pharmacy Consult Note - Anticoagulation  Pharmacy Consult for heparin, warfarin Indication: pulmonary embolus (Subtherapeutic INR while on warfarin)  PATIENT MEASUREMENTS: Height: 5\' 7"  (170.2 cm) Weight: (!) 156 kg (343 lb 14.7 oz) IBW/kg (Calculated) : 66.1 HEPARIN DW (KG): 104.5  VITAL SIGNS: Temp: 98.4 F (36.9 C) (10/19 0353) Temp Source: Oral (10/19 0353) BP: 116/82 (10/19 0353) Pulse Rate: 83 (10/19 0353)  Recent Labs    06/18/23 0326  HGB 13.8  HCT 42.7  PLT 224  LABPROT 25.1*  INR 2.2*  HEPARINUNFRC 0.50  CREATININE 1.30*    Estimated Creatinine Clearance: 94.9 mL/min (A) (by C-G formula based on SCr of 1.3 mg/dL (H)).  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  Regular the workup has not been forthcoming) previously followed by Dr. [facility] at [facility]   Hepatic steatosis    Hypertension    Morbid obesity with BMI of 50.0-59.9, adult (HCC)    Oxygen deficiency    Peripheral vascular disease (HCC)    Presence of IVC filter    Has 2 IVC filters with significant thrombus burden superior to the filter.   Pulmonary embolus (HCC) 12/2016   Initially just treated with anticoagulation, but in February 2023 in April 2024 treated with EKOS thrombectomy for submassive PE.   Ventricular trigeminy    Weakness of both lower limbs     ASSESSMENT: 52 y.o. 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 CHADSVASc 4. Patient is not a good candidate for DOAC due to CTEPH and failed therapies in the past. Will aim for an INR goal of 2.5 to 3 due to recurrent VTEs. Patient has a large body mass which may cause the need for higher warfarin doses. Patient was subtherapeutic at admission. During the last  admission, the patient needed a total of 66.5 mg/week of warfarin to become therapeutic in the 2.5 to 3 range. DDI include flagyl and ceftriaxone which can increase INR.   Pertinent medications: PTA Warfarin 5 mg daily  Goal(s) of therapy: Heparin level 0.3 - 0.7 units/mL INR 2.5 - 3 Monitor platelets by anticoagulation protocol: Yes   Baseline anticoagulation labs: Recent Labs    06/16/23 0437 06/17/23 0132 06/18/23 0326  INR 1.8* 2.0* 2.2*  HGB 13.6 13.6 13.8  PLT 176 207 224   Heparin Date Time aPTT/HL Rate/Comment 10/15 0630   Baseline INR 1.6, start heparin infusion 10/15 1652 HL 0.46 Therapeutic x 1 10/15 2256 -- / 0.44 Therapeutic x 2 10/16 0405 -- / 0.42 Therapeutic x 3 10/17 0437 -- / 0.16 Subtherapeutic 10/17 1258 -- / 0.41 Therapeutic x  1 10/17 1816 -- / 0.16 Subtherapeutic 10/18 0132 -- / 0.65 Therapeutic x 1 10/18 0846 -- / 0.35 Therapeutic x 2 10/19 0326 -- / 0.5  Therapeutic   Warfarin Date Time INR Comment/Dose 10/15 0630 1.6 Subtherapeutic,  10/16 X X No INR, Warfarin 7.5 mg given 10/17 0437 1.8 Subtherapeutic, 10  mg given 10/18 0132 2.0 Subtherapeutic 10/18  2.2  Heparin Plan: Heparin level is therapeutic. Will continue heparin at 2200 units/hr. Check heparin level and CBC with AM labs. Stop heparin once INR > 2.5.   Warfarin Plan INR is subtherapeutic with the new INR goal but trending up. Will give warfarin 10 mg x 1 (~100% increase from home dose). Daily INR.   Discharge recommendation: Recommend discharging the patient on warfarin 10 mg daily. Abx set to end 10/22. Close INR follow up needed.   Paschal Dopp, PharmD,  BCPS 06/18/2023 5:30 AM

## 2023-06-19 DIAGNOSIS — I2699 Other pulmonary embolism without acute cor pulmonale: Secondary | ICD-10-CM | POA: Diagnosis not present

## 2023-06-19 DIAGNOSIS — I4892 Unspecified atrial flutter: Secondary | ICD-10-CM | POA: Diagnosis not present

## 2023-06-19 LAB — BASIC METABOLIC PANEL
Anion gap: 11 (ref 5–15)
BUN: 25 mg/dL — ABNORMAL HIGH (ref 6–20)
CO2: 23 mmol/L (ref 22–32)
Calcium: 8.2 mg/dL — ABNORMAL LOW (ref 8.9–10.3)
Chloride: 99 mmol/L (ref 98–111)
Creatinine, Ser: 1.66 mg/dL — ABNORMAL HIGH (ref 0.61–1.24)
GFR, Estimated: 49 mL/min — ABNORMAL LOW (ref >60)
Glucose, Bld: 105 mg/dL — ABNORMAL HIGH (ref 70–99)
Potassium: 3.6 mmol/L (ref 3.5–5.1)
Sodium: 133 mmol/L — ABNORMAL LOW (ref 135–145)

## 2023-06-19 LAB — CULTURE, BLOOD (ROUTINE X 2)
Culture: NO GROWTH
Culture: NO GROWTH
Special Requests: ADEQUATE
Special Requests: ADEQUATE

## 2023-06-19 LAB — CBC
HCT: 47.3 % (ref 39.0–52.0)
Hemoglobin: 15.4 g/dL (ref 13.0–17.0)
MCH: 30.3 pg (ref 26.0–34.0)
MCHC: 32.6 g/dL (ref 30.0–36.0)
MCV: 93.1 fL (ref 80.0–100.0)
Platelets: 254 10*3/uL (ref 150–400)
RBC: 5.08 MIL/uL (ref 4.22–5.81)
RDW: 16.2 % — ABNORMAL HIGH (ref 11.5–15.5)
WBC: 5 10*3/uL (ref 4.0–10.5)
nRBC: 0 % (ref 0.0–0.2)

## 2023-06-19 LAB — HEPARIN LEVEL (UNFRACTIONATED): Heparin Unfractionated: 0.58 [IU]/mL (ref 0.30–0.70)

## 2023-06-19 LAB — PROTIME-INR
INR: 2.8 — ABNORMAL HIGH (ref 0.8–1.2)
Prothrombin Time: 30.1 s — ABNORMAL HIGH (ref 11.4–15.2)

## 2023-06-19 LAB — MAGNESIUM: Magnesium: 1.9 mg/dL (ref 1.7–2.4)

## 2023-06-19 LAB — GLUCOSE, CAPILLARY
Glucose-Capillary: 130 mg/dL — ABNORMAL HIGH (ref 70–99)
Glucose-Capillary: 140 mg/dL — ABNORMAL HIGH (ref 70–99)
Glucose-Capillary: 131 mg/dL — ABNORMAL HIGH (ref 70–99)

## 2023-06-19 MED ORDER — POTASSIUM CHLORIDE CRYS ER 20 MEQ PO TBCR
40.0000 meq | EXTENDED_RELEASE_TABLET | Freq: Once | ORAL | Status: AC
  Administered 2023-06-19: 40 meq via ORAL
  Filled 2023-06-19: qty 2

## 2023-06-19 MED ORDER — METOPROLOL TARTRATE 25 MG PO TABS
25.0000 mg | ORAL_TABLET | Freq: Four times a day (QID) | ORAL | Status: DC
  Administered 2023-06-19 – 2023-06-22 (×9): 25 mg via ORAL
  Filled 2023-06-19 (×10): qty 1

## 2023-06-19 MED ORDER — WARFARIN SODIUM 4 MG PO TABS
8.0000 mg | ORAL_TABLET | Freq: Once | ORAL | Status: AC
  Administered 2023-06-19: 8 mg via ORAL
  Filled 2023-06-19: qty 2

## 2023-06-19 NOTE — Progress Notes (Signed)
   Patient Name: Ryan [name] Date of Encounter: 06/19/2023 [facility] Cardiologist: None   Interval Summary  .    Patient seen on a.m. rounds.  Continues to have elevated heart rate and atrial flutter on amiodarone.  Continues to have shortness of breath but denies chest pain.  Vital Signs .    Vitals:   06/19/23 0422 06/19/23 0600 06/19/23 0848 06/19/23 0919  BP:  102/75 122/86   Pulse:    (!) 130  Resp:      Temp:   97.6 F (36.4 C)   TempSrc:      SpO2:      Weight: (!) 151.4 kg     Height:        Intake/Output Summary (Last 24 hours) at 06/19/2023 1137 Last data filed at 06/19/2023 0805 Gross per 24 hour  Intake 1416.65 ml  Output 1350 ml  Net 66.65 ml      06/19/2023    4:22 AM 06/17/2023    6:22 AM 06/14/2023    6:28 AM  Last 3 Weights  Weight (lbs) 333 lb 12.4 oz 343 lb 14.7 oz 343 lb  Weight (kg) 151.4 kg 156 kg 155.584 kg      Telemetry/ECG    Atrial flutter rates of 120- Personally Reviewed  Physical Exam .   GEN: No acute distress.   Neck: Unable to assess JVD due to body habitus Cardiac: IR IR, no murmurs, rubs, or gallops.  Respiratory: Clear with diminished bases to auscultation bilaterally.  Respirations are unlabored at rest on 5 L of O2 via nasal cannula GI: Soft, nontender, obese, non-distended  MS: 1+ edema BLE  Assessment & Plan .     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 -Continued on  heparin and Coumadin per pharmacy protocol -Continued on torsemide 40 mg daily -Continue on oxygen therapy titrate FiO2 to maintain sats greater than equal to 92% -Will need outpatient right heart catheterization that is currently on hold given large clot burden from PE -Sleep study on discharge -Autoimmune workup and hematology workup has been completed prior to hospitalization at outside facility -Continues to be followed by advanced heart failure clinic -Continued on spironolactone and Entresto -Daily BMP with slight jump in serum creatinine noted today -Daily weights, I's and O's, low-sodium diet  Sepsis -Presented with fever, elevated lactate, likely source of nonhealing lower extremity wound -WBCs and fever have been trended and normalizing -Antibiotics and treatment continued per primary team  Morbid obesity -BMI 52.28 -Makes prognoses difficult -Would benefit from weight loss For questions or updates, please contact [facility] Please consult [email or web address] for contact info under        Signed, Brion Sossamon, NP

## 2023-06-19 NOTE — Consult Note (Addendum)
Pharmacy Consult Note - Anticoagulation  Pharmacy Consult for heparin, warfarin Indication: pulmonary embolus (Subtherapeutic INR while on warfarin)  PATIENT MEASUREMENTS: Height: 5\' 7"  (170.2 cm) Weight: (!) 151.4 kg (333 lb 12.4 oz) IBW/kg (Calculated) : 66.1 HEPARIN DW (KG): 104.5  VITAL SIGNS: Temp: 98 F (36.7 C) (10/20 0400) Temp Source: Oral (10/20 0400) BP: 102/75 (10/20 0600) Pulse Rate: 93 (10/20 0000)  Recent Labs    06/18/23 0326 06/19/23 0327  HGB 13.8 15.4  HCT 42.7 47.3  PLT 224 254  LABPROT 25.1* 30.1*  INR 2.2* 2.8*  HEPARINUNFRC 0.50 0.58  CREATININE 1.30*  --     Estimated Creatinine Clearance: 93.1 mL/min (A) (by C-G formula based on SCr of 1.3 mg/dL (H)).  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  Regular the workup has not been forthcoming) previously followed by Dr. Isaiah Serge at [facility]   Hepatic steatosis    Hypertension    Morbid obesity with BMI of 50.0-59.9, adult (HCC)    Oxygen deficiency    Peripheral vascular disease (HCC)    Presence of IVC filter    Has 2 IVC filters with significant thrombus burden superior to the filter.   Pulmonary embolus (HCC) 12/2016   Initially just treated with anticoagulation, but in February 2023 in April 2024 treated with EKOS thrombectomy for submassive PE.   Ventricular trigeminy    Weakness of both lower limbs     ASSESSMENT: 52 y.o. 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 CHADSVASc 4. Patient is not a good candidate for DOAC due to CTEPH and failed therapies in the past. Will aim for an INR goal of 2.5 to 3 due to recurrent VTEs. Patient has a large body mass which may cause the need for higher warfarin doses. Patient was subtherapeutic at admission. During the last  admission, the patient needed a total of 66.5 mg/week of warfarin to become therapeutic in the 2.5 to 3 range. DDI include flagyl and ceftriaxone which can increase INR.   Pertinent medications: PTA Warfarin 5 mg daily  Goal(s) of therapy: Heparin level 0.3 - 0.7 units/mL INR 2.5 - 3 Monitor platelets by anticoagulation protocol: Yes   Baseline anticoagulation labs: Recent Labs    06/17/23 0132 06/18/23 0326 06/19/23 0327  INR 2.0* 2.2* 2.8*  HGB 13.6 13.8 15.4  PLT 207 224 254   Heparin Date Time aPTT/HL Rate/Comment 10/18 0132 -- / 0.65 Therapeutic x 1 10/18 0846 -- / 0.35 Therapeutic x 2 10/19 0326 -- / 0.5  Therapeutic  10/20 0327 --/ 0.58  Therapeutic.   Warfarin Date Time INR Comment/Dose 10/15 0630 1.6 Subtherapeutic,  10/16 X X No INR, Warfarin 7.5 mg given 10/17 0437 1.8 Subtherapeutic, 10 mg given 10/18 0132 2.0 Subtherapeutic 10 mg 10/19  2.2 10 mg 10/20  2.8 8 mg  Heparin Plan: INR > 2.5. Will stop heparin infusion.   Warfarin Plan INR is therapeutic with  the new INR goal with a big jump from 10/19 to 10/20. Will give warfarin 8 mg x 1 (~60% increase from home dose). Daily INR. New DDI with amiodarone, usually the effect of amiodarone on INR takes 2 weeks.   Discharge recommendation: Recommend discharging the patient on warfarin 8 mg daily. Abx set to end 10/22. Close INR follow up needed.   Paschal Dopp, PharmD, BCPS 06/19/2023 7:40 AM

## 2023-06-19 NOTE — Plan of Care (Signed)

## 2023-06-19 NOTE — Progress Notes (Addendum)
PROGRESS NOTE    Ryan [name]   QMV:[id number] DOB: 10-04-1970  DOA: 06/14/2023 Date of Service: 06/19/23 which is hospital day 5  PCP: [name], NP    HPI: Ryan [name] is a 52 y.o. 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 Presented to ED 06/14/23 w/ respiratory distress, SOB. Of note, recent admission requiring mechanical thrombectomy 05/17/23, chronically on coumadin. No chest pain. Had mild cough, nausea/vomiting, diarrhea over few hours prior to arrival   Hospital course / significant events:  10/15: to ED, CTA chest (+)1 acute to subacute new PE in LUL, remainder of PE is chronic, chronic RV strain. Subtherapeutic INR 1.6. Requiring 4L O2 [place]. (+)chronic wound LLE w/ a foul odor, abx initiated. Concern for acute CHF. Vascular surgery consulted - continue heparin but no procedures planned for now, recs for diuresis and reevaluate. Hematology consulted - continue heparin bridging to coumadin w/ INR goal 2.5-3.  10/16: still tachypneic, on 4L O2. Cardiology following, increased diuresis. pharmacy following for coumadin bridging to goal INR .  10/17-10/18: BCx NG.  INR still subtherapeutic, continue heparin. Weaning off O2.  10/19: AFlutter RVR requiring amiodarone gtt, restarted beta blocker  10/20: remains Aflutter/rapid but improved, staying on amio gtt for today  Consultants:  Vascular Surgery Hematology/Oncology  Cardiology    Procedures/Surgeries: none      ASSESSMENT & PLAN:   Acute on chronic respiratory failure with hypoxia Ddx: CHF exacerbation / R heart strain, acute on chronic PE but this is fairly mild, sepsis d/t LE wound, restrictive lung disease d/t habitus  Treating underlying causes as below  Supplemental O2    Hx  paroxysmal Afib Aflutter w/ RVR this morning 06/18/23  Given IV labetalol with spontaneous conversion to sinus rhythm briefly but later on reverted to atrial flutter.  Amiodarone gtt to continue to tomorrow then plan for po  Lopressor 12.5 mg every 6 hours  Heparin bridge to Coumadin as per pharmacy protocol --> INR therapeutic today ok to d/c heparin  Acute on Chronic Pulmonary emboli  Hx recurrent PE and DVT, failed Xarelto and Eliquis, hypercoag w/u neg  Status post IVC x 2 On Coumadin, w/ Subtherapeutic INR, question adherence  INR goal 2.5-3 Vascular surgery - no procedures planned Hematology following - heparin gtt w/ bridge to coumadin  --> INR therapeutic today ok to d/c heparin  Acute on chronic HFrEF (heart failure with reduced ejection fraction)  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 2D echo 05/20/2023 with a EF of 60 to 65% As of 06/18/23 Net IO Since Admission: -3,652.41 mL [06/18/23 1532] torsemide 40 mg daily  spironolactone 25 mg daily  Entresto 24/26  Initially planning to hold beta blocker d/t low BP but have restarted this given Aflutter see above - metoprolol 12.5 mg po q6h Cardiology following Will need outpatient R heart cath  Will need outpatient sleep study  Strict ins and outs and daily weights   SIRS d/t PE/CHF, questions severe sepsis d/t acute on chronic lower extremity wound with new ulcer formation and foul-smelling odor Sepsis ruled out clinically, suspect SIRS d/t PE/CHF  Meeting SIRS vs severe sepsis criteria with temperature of 101, respirations in the upper 20s, lactate noted be greater than 2 Plain film imaging of the left lower extremity - no concerns, (+)edema  ESR  and sed rate --> no concerns  Rocephin, Flagyl - d/c (completion 5 days)  Panculture - NG thus far  Wound care consult - follow dressing instructions: Clean L lower medial leg ulcer with NS, apply silver hydrofiber ([employer] [place] [phone]) cut to fit wound bed every  other day, cover with dry gauze or ABD pad. Wrap leg with Kerlix roll gauze beginning just above toes and ending right below knee. Secure with Coban wrapped in same fashion as Kerlix for some compression. Moisten silver w/ NS to facilitate removal     AKI, mild Likely related to diuresis Repeat BMP in AM, if worse consider reduce diuretics   Type 2 diabetes mellitus with diabetic chronic kidney disease (HCC) SSI A1c   Essential hypertension BP stable Monitor with diuresis and concurrent acute on chronic PE with secondary heart strain   CKD (chronic kidney disease), stage IIIa Cr 1.5 w/ GFR in the 50s  Monitor w/ diuresis    Elevated troponin Troponin 40s to 50s Suspect secondary mild demand ischemia in the setting of acute on chronic PE, acute on chronic HFpEF as well as sepsis On heparin drip  --> INR therapeutic today ok to d/c heparin Monitor   Dizziness No chest pain/SOB, no neuro deficits  Suspect Rx effect - started torsemide and entresto today, BP ok and no orthostatic symptoms  Meclizine prn Further w/u if persists    Morbid obesity based on BMI: Body mass index is 53.87 kg/m.  Complicates clinical prognosis   DVT prophylaxis: heparin/coumadin IV fluids: no continuous IV fluids  Nutrition: carb/cardiac Central lines / invasive devices: none  Code Status: FULL CODE ACP documentation reviewed: 06/18/23 and none on file in [facility]  TOC needs: TBD, expect may need to discharge on continuous O2 rather than hs  Barriers to dispo / significant pending items: heparin gtt bridging to coumadin, treating CHF              Subjective / Brief ROS:  Patient reports still feeling some palpiatations but breathing is better, no chest pain  Ambulating well  Denies CP/SOB.  Denies LE pain/edema, reports legs are at baseline  Pain controlled.  Denies new weakness.  Tolerating diet.   Family Communication: none at this time     Objective Findings:  Vitals:    06/19/23 0422 06/19/23 0600 06/19/23 0848 06/19/23 0919  BP:  102/75 122/86   Pulse:    (!) 130  Resp:      Temp:   97.6 F (36.4 C)   TempSrc:      SpO2:      Weight: (!) 151.4 kg     Height:        Intake/Output Summary (Last 24 hours) at 06/19/2023 1506 Last data filed at 06/19/2023 0805 Gross per 24 hour  Intake 1416.65 ml  Output 1350 ml  Net 66.65 ml   Filed Weights   06/14/23 0628 06/17/23 0622 06/19/23 0422  Weight: (!) 155.6 kg (!) 156 kg (!) 151.4 kg    Examination:  Physical Exam Constitutional:      General: He is not in acute distress.    Appearance: He is obese.  Cardiovascular:     Rate and Rhythm: Regular rhythm. Tachycardia present.  Pulmonary:     Effort: Pulmonary effort is normal.     Breath sounds: Normal breath sounds.  Abdominal:     Tenderness: There is no abdominal tenderness.  Musculoskeletal:     Right lower leg: Edema present.  Left lower leg: Edema present.  Skin:    General: Skin is dry.  Neurological:     General: No focal deficit present.     Mental Status: He is alert and oriented to person, place, and time.  Psychiatric:        Mood and Affect: Mood normal.        Behavior: Behavior normal.          Scheduled Medications:   vitamin C  500 mg Oral BID   Chlorhexidine Gluconate Cloth  6 each Topical Daily   insulin aspart  0-9 Units Subcutaneous TID WC   metoprolol tartrate  25 mg Oral Q6H   multivitamin with minerals  1 tablet Oral Daily   nutrition supplement (JUVEN)  1 packet Oral BID BM   Ensure Max Protein  11 oz Oral BID   sacubitril-valsartan  1 tablet Oral BID   spironolactone  25 mg Oral Daily   torsemide  40 mg Oral Daily   warfarin  8 mg Oral ONCE-1600   Warfarin - Pharmacist Dosing Inpatient   Does not apply q1600    Continuous Infusions:  amiodarone 30 mg/hr (06/19/23 0918)    PRN Medications:  mouth rinse  Antimicrobials from admission:  Anti-infectives (From admission, onward)    Start      Dose/Rate Route Frequency Ordered Stop   06/16/23 2200  metroNIDAZOLE (FLAGYL) IVPB 500 mg       Placed in "And" Linked Group   500 mg 100 mL/hr over 60 Minutes Intravenous Every 12 hours 06/16/23 1003 06/19/23 0700   06/16/23 1400  cefTRIAXone (ROCEPHIN) 2 g in sodium chloride 0.9 % 100 mL IVPB       Placed in "And" Linked Group   2 g 200 mL/hr over 30 Minutes Intravenous Every 24 hours 06/16/23 1003 06/18/23 1401   06/16/23 0600  vancomycin (VANCOREADY) IVPB 2000 mg/400 mL  Status:  Discontinued        2,000 mg 200 mL/hr over 120 Minutes Intravenous Every 24 hours 06/15/23 1342 06/16/23 0935   06/15/23 0600  vancomycin (VANCOREADY) IVPB 1750 mg/350 mL  Status:  Discontinued        1,750 mg 175 mL/hr over 120 Minutes Intravenous Every 24 hours 06/14/23 1316 06/15/23 1342   06/14/23 1500  vancomycin (VANCOREADY) IVPB 1500 mg/300 mL        1,500 mg 150 mL/hr over 120 Minutes Intravenous  Once 06/14/23 1316 06/14/23 1823   06/14/23 1400  cefTRIAXone (ROCEPHIN) 2 g in sodium chloride 0.9 % 100 mL IVPB  Status:  Discontinued       Placed in "And" Linked Group   2 g 200 mL/hr over 30 Minutes Intravenous Every 24 hours 06/14/23 1256 06/16/23 1003   06/14/23 1400  metroNIDAZOLE (FLAGYL) IVPB 500 mg  Status:  Discontinued       Placed in "And" Linked Group   500 mg 100 mL/hr over 60 Minutes Intravenous Every 8 hours 06/14/23 1256 06/16/23 1003   06/14/23 0900  vancomycin (VANCOCIN) IVPB 1000 mg/200 mL premix        1,000 mg 200 mL/hr over 60 Minutes Intravenous  Once 06/14/23 0858 06/14/23 1012           Data Reviewed:  I have personally reviewed the following...  CBC: Recent Labs  Lab 06/14/23 0630 06/15/23 0405 06/16/23 0437 06/17/23 0132 06/18/23 0326 06/19/23 0327  WBC 10.7* 6.8 4.4 4.8 4.6 5.0  NEUTROABS 9.0*  --   --   --   --   --  HGB 15.0 13.6 13.6 13.6 13.8 15.4  HCT 46.3 41.5 41.8 40.9 42.7 47.3  MCV 93.9 93.0 93.7 93.0 93.0 93.1  PLT 223 190 176 207 224 254    Basic Metabolic Panel: Recent Labs  Lab 06/15/23 0405 06/16/23 0437 06/17/23 0846 06/18/23 0326 06/19/23 0327  NA 132* 133* 134* 136 133*  K 4.2 3.8 3.7 3.3* 3.6  CL 103 102 103 100 99  CO2 21* 22 20* 25 23  GLUCOSE 114* 97 106* 111* 105*  BUN 18 19 19  22* 25*  CREATININE 1.32* 1.28* 1.29* 1.30* 1.66*  CALCIUM 8.3* 8.2* 8.5* 8.2* 8.2*  MG  --  1.8  --  1.9 1.9  PHOS  --  3.3  --   --   --    GFR: Estimated Creatinine Clearance: 72.9 mL/min (A) (by C-G formula based on SCr of 1.66 mg/dL (H)). Liver Function Tests: Recent Labs  Lab 06/15/23 0405  AST 15  ALT 15  ALKPHOS 48  BILITOT 1.2  PROT 7.3  ALBUMIN 3.2*   No results for input(s): "LIPASE", "AMYLASE" in the last 168 hours. No results for input(s): "AMMONIA" in the last 168 hours. Coagulation Profile: Recent Labs  Lab 06/14/23 0630 06/16/23 0437 06/17/23 0132 06/18/23 0326 06/19/23 0327  INR 1.6* 1.8* 2.0* 2.2* 2.8*   Cardiac Enzymes: No results for input(s): "CKTOTAL", "CKMB", "CKMBINDEX", "TROPONINI" in the last 168 hours. BNP (last 3 results) No results for input(s): "PROBNP" in the last 8760 hours. HbA1C: No results for input(s): "HGBA1C" in the last 72 hours.  CBG: Recent Labs  Lab 06/18/23 1122 06/18/23 1651 06/18/23 2010 06/19/23 0938 06/19/23 1241  GLUCAP 145* 152* 130* 130* 140*   Lipid Profile: No results for input(s): "CHOL", "HDL", "LDLCALC", "TRIG", "CHOLHDL", "LDLDIRECT" in the last 72 hours. Thyroid Function Tests: No results for input(s): "TSH", "T4TOTAL", "FREET4", "T3FREE", "THYROIDAB" in the last 72 hours. Anemia Panel: No results for input(s): "VITAMINB12", "FOLATE", "FERRITIN", "TIBC", "IRON", "RETICCTPCT" in the last 72 hours. Most Recent Urinalysis On File:     Component Value Date/Time   COLORURINE YELLOW (A) 06/14/2023 0827   APPEARANCEUR CLEAR (A) 06/14/2023 0827   APPEARANCEUR Clear 03/18/2022 1436   LABSPEC 1.037 (H) 06/14/2023 0827   LABSPEC 1.012 12/25/2014  1655   PHURINE 5.0 06/14/2023 0827   GLUCOSEU NEGATIVE 06/14/2023 0827   GLUCOSEU Negative 12/25/2014 1655   HGBUR NEGATIVE 06/14/2023 0827   BILIRUBINUR NEGATIVE 06/14/2023 0827   BILIRUBINUR Negative 03/18/2022 1436   BILIRUBINUR Negative 12/25/2014 1655   KETONESUR NEGATIVE 06/14/2023 0827   PROTEINUR >=300 (A) 06/14/2023 0827   NITRITE NEGATIVE 06/14/2023 0827   LEUKOCYTESUR NEGATIVE 06/14/2023 0827   LEUKOCYTESUR Negative 12/25/2014 1655   Sepsis Labs: @LABRCNTIP (procalcitonin:4,lacticidven:4) Microbiology: Recent Results (from the past 240 hour(s))  Resp panel by RT-PCR (RSV, Flu A&B, Covid) Anterior Nasal Swab     Status: None   Collection Time: 06/14/23  6:32 AM   Specimen: Anterior Nasal Swab  Result Value Ref Range Status   SARS Coronavirus 2 by RT PCR NEGATIVE NEGATIVE Final    Comment: (NOTE) SARS-CoV-2 target nucleic acids are NOT DETECTED.  The SARS-CoV-2 RNA is generally detectable in upper respiratory specimens during the acute phase of infection. The lowest concentration of SARS-CoV-2 viral copies this assay can detect is 138 copies/mL. A negative result does not preclude SARS-Cov-2 infection and should not be used as the sole basis for treatment or other patient management decisions. A negative result may occur with  improper specimen  collection/handling, submission of specimen other than nasopharyngeal swab, presence of viral mutation(s) within the areas targeted by this assay, and inadequate number of viral copies(<138 copies/mL). A negative result must be combined with clinical observations, patient history, and epidemiological information. The expected result is Negative.  Fact Sheet for Patients:  BloggerCourse.com  Fact Sheet for Healthcare Providers:  SeriousBroker.it  This test is no t yet approved or cleared by the [place] FDA and  has been authorized for detection and/or diagnosis of  SARS-CoV-2 by FDA under an Emergency Use Authorization (EUA). This EUA will remain  in effect (meaning this test can be used) for the duration of the COVID-19 declaration under Section 564(b)(1) of the Act, 21 U.S.C.section 360bbb-3(b)(1), unless the authorization is terminated  or revoked sooner.       Influenza A by PCR NEGATIVE NEGATIVE Final   Influenza B by PCR NEGATIVE NEGATIVE Final    Comment: (NOTE) The Xpert Xpress SARS-CoV-2/FLU/RSV plus assay is intended as an aid in the diagnosis of influenza from Nasopharyngeal swab specimens and should not be used as a sole basis for treatment. Nasal washings and aspirates are unacceptable for Xpert Xpress SARS-CoV-2/FLU/RSV testing.  Fact Sheet for Patients: BloggerCourse.com  Fact Sheet for Healthcare Providers: SeriousBroker.it  This test is not yet approved or cleared by the [place] FDA and has been authorized for detection and/or diagnosis of SARS-CoV-2 by FDA under an Emergency Use Authorization (EUA). This EUA will remain in effect (meaning this test can be used) for the duration of the COVID-19 declaration under Section 564(b)(1) of the Act, 21 U.S.C. section 360bbb-3(b)(1), unless the authorization is terminated or revoked.     Resp Syncytial Virus by PCR NEGATIVE NEGATIVE Final    Comment: (NOTE) Fact Sheet for Patients: BloggerCourse.com  Fact Sheet for Healthcare Providers: SeriousBroker.it  This test is not yet approved or cleared by the [place] FDA and has been authorized for detection and/or diagnosis of SARS-CoV-2 by FDA under an Emergency Use Authorization (EUA). This EUA will remain in effect (meaning this test can be used) for the duration of the COVID-19 declaration under Section 564(b)(1) of the Act, 21 U.S.C. section 360bbb-3(b)(1), unless the authorization is terminated  or revoked.  Performed at [facility], [address] [place], [place] [address]   Blood Culture (routine x 2)     Status: None   Collection Time: 06/14/23  8:27 AM   Specimen: BLOOD  Result Value Ref Range Status   Specimen Description BLOOD RIGHT HAND  Final   Special Requests   Final    BOTTLES DRAWN AEROBIC AND ANAEROBIC Blood Culture adequate volume   Culture   Final    NO GROWTH 5 DAYS Performed at [facility], [address], [place], [place] [address]    Report Status 06/19/2023 FINAL  Final  Blood Culture (routine x 2)     Status: None   Collection Time: 06/14/23  8:27 AM   Specimen: BLOOD  Result Value Ref Range Status   Specimen Description BLOOD RIGHT ARM  Final   Special Requests   Final    BOTTLES DRAWN AEROBIC AND ANAEROBIC Blood Culture adequate volume   Culture   Final    NO GROWTH 5 DAYS Performed at [facility], [address] [place], [place] [address]    Report Status 06/19/2023 FINAL  Final  MRSA Next Gen by PCR, Nasal     Status: None   Collection Time: 06/14/23 10:45 AM  Specimen: Nasal Mucosa; Nasal Swab  Result Value Ref Range Status   MRSA by PCR Next Gen NOT DETECTED NOT DETECTED Final    Comment: (NOTE) The GeneXpert MRSA Assay (FDA approved for NASAL specimens only), is one component of a comprehensive MRSA colonization surveillance program. It is not intended to diagnose MRSA infection nor to guide or monitor treatment for MRSA infections. Test performance is not FDA approved in patients less than 43 years old. Performed at [facility], [address], [place], [place] [address]       Radiology Studies last 3 days: No results found.      Sunnie Nielsen, DO [facility] 06/19/2023, 3:06 PM    Dictation software may have been used to generate the above note. Typos may occur and escape review in typed/dictated notes. Please contact Dr Lyn Hollingshead directly for clarity if  needed.  Staff may message me via secure chat in Epic  but this may not receive an immediate response,  please page me for urgent matters!  If 7PM-7AM, please contact night coverage [email or web address]

## 2023-06-20 DIAGNOSIS — I2699 Other pulmonary embolism without acute cor pulmonale: Secondary | ICD-10-CM | POA: Diagnosis not present

## 2023-06-20 DIAGNOSIS — I4892 Unspecified atrial flutter: Secondary | ICD-10-CM | POA: Diagnosis not present

## 2023-06-20 DIAGNOSIS — I483 Typical atrial flutter: Secondary | ICD-10-CM | POA: Diagnosis not present

## 2023-06-20 LAB — BASIC METABOLIC PANEL WITH GFR
Anion gap: 12 (ref 5–15)
BUN: 31 mg/dL — ABNORMAL HIGH (ref 6–20)
CO2: 24 mmol/L (ref 22–32)
Calcium: 8.9 mg/dL (ref 8.9–10.3)
Chloride: 100 mmol/L (ref 98–111)
Creatinine, Ser: 1.85 mg/dL — ABNORMAL HIGH (ref 0.61–1.24)
GFR, Estimated: 43 mL/min — ABNORMAL LOW (ref >60)
Glucose, Bld: 122 mg/dL — ABNORMAL HIGH (ref 70–99)
Potassium: 4.1 mmol/L (ref 3.5–5.1)
Sodium: 136 mmol/L (ref 135–145)

## 2023-06-20 LAB — PROTIME-INR
INR: 2.7 — ABNORMAL HIGH (ref 0.8–1.2)
Prothrombin Time: 28.9 s — ABNORMAL HIGH (ref 11.4–15.2)

## 2023-06-20 LAB — GLUCOSE, CAPILLARY
Glucose-Capillary: 114 mg/dL — ABNORMAL HIGH (ref 70–99)
Glucose-Capillary: 136 mg/dL — ABNORMAL HIGH (ref 70–99)
Glucose-Capillary: 127 mg/dL — ABNORMAL HIGH (ref 70–99)
Glucose-Capillary: 98 mg/dL (ref 70–99)

## 2023-06-20 MED ORDER — SODIUM CHLORIDE 0.9 % IV SOLN: INTRAVENOUS | Status: AC

## 2023-06-20 MED ORDER — WARFARIN SODIUM 7.5 MG PO TABS
7.5000 mg | ORAL_TABLET | Freq: Every day | ORAL | Status: DC
  Administered 2023-06-20: 7.5 mg via ORAL
  Filled 2023-06-20: qty 1

## 2023-06-20 NOTE — Consult Note (Addendum)
Pharmacy Consult Note - Anticoagulation  Pharmacy Consult for heparin, warfarin Indication: pulmonary embolus (Subtherapeutic INR)  PATIENT MEASUREMENTS: Height: 5\' 7"  (170.2 cm) Weight: (!) 151.3 kg (333 lb 9.6 oz) IBW/kg (Calculated) : 66.1 HEPARIN DW (KG): 104.5  VITAL SIGNS: Temp: 97.8 F (36.6 C) (10/21 0300) Temp Source: Oral (10/21 0300) BP: 100/74 (10/21 0642) Pulse Rate: 111 (10/21 0642)  Recent Labs    06/19/23 0327 06/20/23 0336  HGB 15.4  --   HCT 47.3  --   PLT 254  --   LABPROT 30.1* 28.9*  INR 2.8* 2.7*  HEPARINUNFRC 0.58  --   CREATININE 1.66* 1.85*    Estimated Creatinine Clearance: 65.4 mL/min (A) (by C-G formula based on SCr of 1.85 mg/dL (H)).  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  Regular the workup has not been forthcoming) previously followed by Dr. Isaiah Serge at [facility]   Hepatic steatosis    Hypertension    Morbid obesity with BMI of 50.0-59.9, adult (HCC)    Oxygen deficiency    Peripheral vascular disease (HCC)    Presence of IVC filter    Has 2 IVC filters with significant thrombus burden superior to the filter.   Pulmonary embolus (HCC) 12/2016   Initially just treated with anticoagulation, but in February 2023 in April 2024 treated with EKOS thrombectomy for submassive PE.   Ventricular trigeminy    Weakness of both lower limbs     ASSESSMENT: 52 y.o. 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 CHADSVASc 4.   Patient is not a good candidate for DOAC due to CTEPH and failed therapies in the past. Will aim for an INR goal of 2.5 to 3 due to recurrent VTEs. Patient has a large body mass which may cause the need for higher warfarin doses. Patient was subtherapeutic at admission after running out of mediation at home.    DDI metronidazole and ceftriaxone - completed 10/20 DDI with amiodarone started 10/19 - needs close monitoring x 4 weeks until stable - expected 20-25% decrease in warfarin need.  Pertinent medications: PTA Warfarin 5 mg daily  Goal(s) of therapy: Heparin level 0.3 - 0.7 units/mL INR 2.5 - 3 Monitor platelets by anticoagulation protocol: Yes   Baseline anticoagulation labs: Recent Labs    06/18/23 0326 06/19/23 0327 06/20/23 0336  INR 2.2* 2.8* 2.7*  HGB 13.8 15.4  --   PLT 224 254  --    Heparin Plan: Heparin infusion stopped 10/20  Date INR Comment/Dose 10/15 1.6 Subtherapeutic  10/16 --- No INR, Warfarin 7.5 mg given 10/17 1.8 Subtherapeutic, 10 mg given 10/18 2.0 Subtherapeutic 10 mg 10/19 2.2 Subtherapeutic, 10 mg 10/20 2.8 Therapeutic, 8 mg 10/21  2.7  Therapeutic  Plan: INR therapeutic x 2 days. Subtherapeutic on admission due to compliance issues. Per conversation with patient is INR changes drastically with dose changes.  New Ddi with metronidazole completed 10/20, new DDI with amiodarone  stated 10/19 and HF exacerbation (fluid overload) also influencing INR. Patient will require close INR monitoring for the next 4 weeks until INR stable.  Start warfarin 7.5mg  po daily and continue daily INR while inpatient. CBC at least every 3 days.  Briton Sellman Rodriguez-Guzman PharmD, BCPS 06/20/2023 7:32 AM

## 2023-06-20 NOTE — Progress Notes (Signed)
Advanced Heart Failure Rounding Note  PCP-Cardiologist: None   Subjective:    Over the weekend, developed A fib RVR. Started on amio drip and started on lopressor 12.5 mg every 6 hours. Yesterday lopressor increased to 25 mg every 6 hours.     Remains SOB with with exertion.     Objective:   Weight Range: (!) 151.3 kg Body mass index is 52.25 kg/m.   Vital Signs:   Temp:  [97.7 F (36.5 C)-98.1 F (36.7 C)] 98.1 F (36.7 C) (10/21 0736) Pulse Rate:  [64-142] 142 (10/21 0914) Resp:  [16-18] 18 (10/21 0300) BP: (100-115)/(74-88) 115/88 (10/21 0914) SpO2:  [95 %-99 %] 95 % (10/21 0736) FiO2 (%):  [4 %] 4 % (10/21 0736) Weight:  [151.3 kg] 151.3 kg (10/21 0500) Last BM Date : 06/18/23  Weight change: Filed Weights   06/17/23 0622 06/19/23 0422 06/20/23 0500  Weight: (!) 156 kg (!) 151.4 kg (!) 151.3 kg    Intake/Output:   Intake/Output Summary (Last 24 hours) at 06/20/2023 0936 Last data filed at 06/20/2023 0500 Gross per 24 hour  Intake 570.88 ml  Output 2200 ml  Net -1629.12 ml      Physical Exam    General:   No resp difficulty HEENT: Normal Neck: Supple. JVP 5-6  . Carotids 2+ bilat; no bruits. No lymphadenopathy or thyromegaly appreciated. Cor: PMI nondisplaced. Tachy Regular rate & rhythm. No rubs, gallops or murmurs. Lungs: Clear on 4 liters  Abdomen: Soft, nontender, nondistended. No hepatosplenomegaly. No bruits or masses. Good bowel sounds. Extremities: No cyanosis, clubbing, rash, edema Neuro: Alert & orientedx3, cranial nerves grossly intact. moves all 4 extremities w/o difficulty. Affect pleasant   Telemetry   Atrial Flutter 110-120s   EKG    N/A  Labs    CBC Recent Labs    06/18/23 0326 06/19/23 0327  WBC 4.6 5.0  HGB 13.8 15.4  HCT 42.7 47.3  MCV 93.0 93.1  PLT 224 254   Basic Metabolic Panel Recent Labs    25/36/64 0326 06/19/23 0327 06/20/23 0336  NA 136 133* 136  K 3.3* 3.6 4.1  CL 100 99 100  CO2 25 23 24    GLUCOSE 111* 105* 122*  BUN 22* 25* 31*  CREATININE 1.30* 1.66* 1.85*  CALCIUM 8.2* 8.2* 8.9  MG 1.9 1.9  --    Liver Function Tests No results for input(s): "AST", "ALT", "ALKPHOS", "BILITOT", "PROT", "ALBUMIN" in the last 72 hours. No results for input(s): "LIPASE", "AMYLASE" in the last 72 hours. Cardiac Enzymes No results for input(s): "CKTOTAL", "CKMB", "CKMBINDEX", "TROPONINI" in the last 72 hours.  BNP: BNP (last 3 results) Recent Labs    07/04/22 0631 12/02/22 1700 06/14/23 0630  BNP 74.8 83.8 835.3*    ProBNP (last 3 results) No results for input(s): "PROBNP" in the last 8760 hours.   D-Dimer No results for input(s): "DDIMER" in the last 72 hours. Hemoglobin A1C No results for input(s): "HGBA1C" in the last 72 hours. Fasting Lipid Panel No results for input(s): "CHOL", "HDL", "LDLCALC", "TRIG", "CHOLHDL", "LDLDIRECT" in the last 72 hours. Thyroid Function Tests No results for input(s): "TSH", "T4TOTAL", "T3FREE", "THYROIDAB" in the last 72 hours.  Invalid input(s): "FREET3"  Other results:   Imaging    No results found.   Medications:     Scheduled Medications:  vitamin C  500 mg Oral BID   Chlorhexidine Gluconate Cloth  6 each Topical Daily   insulin aspart  0-9 Units Subcutaneous TID  WC   metoprolol tartrate  25 mg Oral Q6H   multivitamin with minerals  1 tablet Oral Daily   nutrition supplement (JUVEN)  1 packet Oral BID BM   Ensure Max Protein  11 oz Oral BID   sacubitril-valsartan  1 tablet Oral BID   spironolactone  25 mg Oral Daily   torsemide  40 mg Oral Daily   warfarin  7.5 mg Oral q1600   Warfarin - Pharmacist Dosing Inpatient   Does not apply q1600    Infusions:  amiodarone 30 mg/hr (06/20/23 0400)    PRN Medications: mouth rinse    Patient Profile   Ryan [name] is a 52 y.o. 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 Echo 9//2024 EF 60-65%. RV moderately reduced. DVT back in 1994. PE diagnosed back in 2018. Had PE thrombectomy x 2 in 2024.  CTA - large chronic nonocclusive thrombus R Main PA + small chronic nonocclusive distal left main and small embolus  segmental branch.   -Diuresed with IV lasix and has been transitioned to torsemide 40 mg daily. -Creatinine trending up. Hold torsemide.   - Continue spironolactone 25mg  daily - Continue entresto 24/26 - Failed DOAC in the past. On Coumadin with INR goal increased to 2.5-3.  - Will need outpatient RHC, hold for the time being given large clot burden - Sleep study at discharge - Autoimmune workup and hematology workup have been completed prior.  2. Sepsis: Presented with fever, elevated lactate, likely source is nonhealing lower extremity wound.  - Continue antibiotics per primary team  3. PAF-->A Flutter RVR  Previously on 25 mg Toprol XL daily. EKG on admit showed SR. Over the weekend converted to A Flutter RVR. Started on amio drip and 12.5 mg lopressor.   Remained in A fib RVR. Lopressor was increased to 25 mg every 6 hours.  On warfarin. INR 2.7. Of note INR has not been therapeutic over the last 30 days.  - Continue amiodarone drip  - Will need TEE/DC-CV tomorrow. Informed Consent   Shared Decision Making/Informed Consent The risks [stroke, cardiac arrhythmias rarely resulting in the need for a temporary or permanent pacemaker, skin irritation or burns, esophageal damage, perforation (1:10,000 risk), bleeding, pharyngeal hematoma as well as other potential complications associated with  conscious sedation including aspiration, arrhythmia, respiratory failure and death], benefits (treatment guidance, restoration of normal sinus rhythm, diagnostic support) and alternatives of a transesophageal echocardiogram guided cardioversion were discussed in detail with Mr. Hornbeak and he is willing to proceed. I contacted anesthesia, Megan and scheduler, Britta Mccreedy to set up. 06/21/23 at 1330.     4. AKI  Creatinine baseline ~ 1.4.  Creatinine trending up with diuresis.  Hold diuretics tomorrow. Already had torsemide today.  BMET in am.   5. Obesity     Length of Stay: 6  Landin Tallon, NP  06/20/2023, 9:36 AM  Advanced Heart Failure Team Pager [phone] (M-F; 7a - 5p)  Please contact [facility] for night-coverage after hours (5p -7a ) and weekends on [email or web address]

## 2023-06-20 NOTE — Progress Notes (Signed)
PROGRESS NOTE    Ryan [name]   ZOX:[id number] DOB: 09-Jun-1971  DOA: 06/14/2023 Date of Service: 06/20/23 which is hospital day 6  PCP: [name], NP    HPI: Ryan [name] is a 52 y.o. 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 Presented to ED 06/14/23 w/ respiratory distress, SOB. Of note, recent admission requiring mechanical thrombectomy 05/17/23, chronically on coumadin. No chest pain. Had mild cough, nausea/vomiting, diarrhea over few hours prior to arrival   Hospital course / significant events:  10/15: to ED, CTA chest (+)1 acute to subacute new PE in LUL, remainder of PE is chronic, chronic RV strain. Subtherapeutic INR 1.6. Requiring 4L O2 [place]. (+)chronic wound LLE w/ a foul odor, abx initiated. Concern for acute CHF. Vascular surgery consulted - continue heparin but no procedures planned for now, recs for diuresis and reevaluate. Hematology consulted - continue heparin bridging to coumadin w/ INR goal 2.5-3.  10/16: still tachypneic, on 4L O2. Cardiology following, increased diuresis. pharmacy following for coumadin bridging to goal INR .  10/17-10/18: BCx NG.  INR still subtherapeutic, continue heparin. Weaning off O2.  10/19: AFlutter RVR requiring amiodarone gtt, restarted beta blocker  10/20: remains Aflutter/rapid but improved, staying on amio gtt for today 10/21 continue amio, cardiology planning TEE/DCCV tomorrow   Consultants:  Vascular Surgery Hematology/Oncology  Cardiology    Procedures/Surgeries: none      ASSESSMENT & PLAN:   Acute on chronic respiratory failure with hypoxia Ddx: CHF exacerbation / R heart strain, acute on chronic PE but this is fairly mild, sepsis d/t LE wound, restrictive lung disease d/t habitus  Treating  underlying causes as below  Supplemental O2    Hx paroxysmal Afib Aflutter w/ RVR morning 06/18/23  Given IV labetalol with spontaneous conversion to sinus rhythm briefly but later on reverted to atrial flutter.  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 2D echo 05/20/2023 with a EF of 60 to 65% As of 06/18/23 Net IO Since Admission: -3,652.41 mL [06/18/23 1532] torsemide 40 mg daily - held today d/t AKI spironolactone 25 mg daily  Entresto 24/26  Lopressor 12.5 mg every 6 hours  Cardiology following Will need outpatient R heart cath  Will need outpatient sleep study  Strict ins and outs and daily weights   SIRS d/t PE/CHF, questions severe sepsis d/t acute on chronic lower extremity wound with new ulcer formation and foul-smelling odor Sepsis ruled out clinically, suspect SIRS d/t PE/CHF  Meeting SIRS vs severe sepsis criteria with temperature of 101, respirations in the upper 20s, lactate noted be greater than 2 Plain film imaging of the left lower extremity - no concerns, (+)edema  ESR and sed rate --> no concerns  Rocephin, Flagyl - d/c (completion 5 days)  Panculture - NG thus far  Wound care consult - follow dressing instructions: Clean L lower medial  leg ulcer with NS, apply silver hydrofiber (Aquacel [place] [phone]) cut to fit wound bed every other day, cover with dry gauze or ABD pad. Wrap leg with Kerlix roll gauze beginning just above toes and ending right below knee. Secure with Coban wrapped in same  fashion as Kerlix for some compression. Moisten silver w/ NS to facilitate removal     AKI, mild  On CKD (chronic kidney disease), stage IIIa Likely related to diuresis Holding diuretics today  Repeat BMP in AM  Type 2 diabetes mellitus with diabetic chronic kidney disease (HCC) SSI A1c   Essential hypertension BP stable Monitor and meds as above    Elevated troponin Troponin 40s to 50s Suspect secondary mild demand ischemia in the setting of acute on chronic PE, acute on chronic HFpEF as well as sepsis Repeat if chest pain    Dizziness - resolved No chest pain/SOB, no neuro deficits  Meclizine prn Further w/u if persists    Morbid obesity based on BMI: Body mass index is 53.87 kg/m.  Complicates clinical prognosis   DVT prophylaxis: heparin/coumadin IV fluids: no continuous IV fluids  Nutrition: carb/cardiac Central lines / invasive devices: none  Code Status: FULL CODE ACP documentation reviewed: 06/18/23 and none on file in [facility]  TOC needs: TBD, expect may need to discharge on continuous O2 rather than hs  Barriers to dispo / significant pending items: heparin gtt bridging to coumadin, treating CHF              Subjective / Brief ROS:  Patient reports still feeling some palpiatations but breathing is better, no chest pain  Ambulating independently but SOB w/ ambulation  Denies CP/SOB at rest Denies LE pain/edema, reports legs are at baseline  Pain controlled.  Denies new weakness.  Tolerating diet.   Family Communication: none at this time     Objective Findings:  Vitals:   06/20/23 0642 06/20/23 0736 06/20/23 0914 06/20/23 1323  BP: 100/74 100/84 115/88   Pulse: (!) 111 (!) 109 (!) 142 97  Resp:    16  Temp:  98.1 F (36.7 C)  98.9 F (37.2 C)  TempSrc:  Oral  Oral  SpO2:  95%  94%  Weight:      Height:        Intake/Output Summary (Last 24 hours) at 06/20/2023 1336 Last data filed at 06/20/2023 1043 Gross per 24 hour  Intake  810.88 ml  Output 2900 ml  Net -2089.12 ml   Filed Weights   06/17/23 0622 06/19/23 0422 06/20/23 0500  Weight: (!) 156 kg (!) 151.4 kg (!) 151.3 kg    Examination:  Physical Exam Constitutional:      General: He is not in acute distress.    Appearance: He is obese.  Cardiovascular:     Rate and Rhythm: Regular rhythm. Tachycardia present.  Pulmonary:     Effort: Pulmonary effort is normal.     Breath sounds: Normal breath sounds.  Abdominal:     Tenderness: There is no abdominal tenderness.  Musculoskeletal:     Right lower leg: Edema present.     Left lower leg: Edema present.  Skin:    General: Skin is dry.  Neurological:     General: No focal deficit present.     Mental Status: He is alert and oriented to person, place, and time.  Psychiatric:        Mood and Affect: Mood normal.        Behavior: Behavior normal.  Scheduled Medications:   vitamin C  500 mg Oral BID   Chlorhexidine Gluconate Cloth  6 each Topical Daily   insulin aspart  0-9 Units Subcutaneous TID WC   metoprolol tartrate  25 mg Oral Q6H   multivitamin with minerals  1 tablet Oral Daily   nutrition supplement (JUVEN)  1 packet Oral BID BM   Ensure Max Protein  11 oz Oral BID   sacubitril-valsartan  1 tablet Oral BID   spironolactone  25 mg Oral Daily   warfarin  7.5 mg Oral q1600   Warfarin - Pharmacist Dosing Inpatient   Does not apply q1600    Continuous Infusions:  sodium chloride 20 mL/hr at 06/20/23 1335   amiodarone 30 mg/hr (06/20/23 1014)    PRN Medications:  mouth rinse  Antimicrobials from admission:  Anti-infectives (From admission, onward)    Start     Dose/Rate Route Frequency Ordered Stop   06/16/23 2200  metroNIDAZOLE (FLAGYL) IVPB 500 mg       Placed in "And" Linked Group   500 mg 100 mL/hr over 60 Minutes Intravenous Every 12 hours 06/16/23 1003 06/19/23 0700   06/16/23 1400  cefTRIAXone (ROCEPHIN) 2 g in sodium chloride 0.9 % 100 mL IVPB       Placed  in "And" Linked Group   2 g 200 mL/hr over 30 Minutes Intravenous Every 24 hours 06/16/23 1003 06/18/23 1401   06/16/23 0600  vancomycin (VANCOREADY) IVPB 2000 mg/400 mL  Status:  Discontinued        2,000 mg 200 mL/hr over 120 Minutes Intravenous Every 24 hours 06/15/23 1342 06/16/23 0935   06/15/23 0600  vancomycin (VANCOREADY) IVPB 1750 mg/350 mL  Status:  Discontinued        1,750 mg 175 mL/hr over 120 Minutes Intravenous Every 24 hours 06/14/23 1316 06/15/23 1342   06/14/23 1500  vancomycin (VANCOREADY) IVPB 1500 mg/300 mL        1,500 mg 150 mL/hr over 120 Minutes Intravenous  Once 06/14/23 1316 06/14/23 1823   06/14/23 1400  cefTRIAXone (ROCEPHIN) 2 g in sodium chloride 0.9 % 100 mL IVPB  Status:  Discontinued       Placed in "And" Linked Group   2 g 200 mL/hr over 30 Minutes Intravenous Every 24 hours 06/14/23 1256 06/16/23 1003   06/14/23 1400  metroNIDAZOLE (FLAGYL) IVPB 500 mg  Status:  Discontinued       Placed in "And" Linked Group   500 mg 100 mL/hr over 60 Minutes Intravenous Every 8 hours 06/14/23 1256 06/16/23 1003   06/14/23 0900  vancomycin (VANCOCIN) IVPB 1000 mg/200 mL premix        1,000 mg 200 mL/hr over 60 Minutes Intravenous  Once 06/14/23 0858 06/14/23 1012           Data Reviewed:  I have personally reviewed the following...  CBC: Recent Labs  Lab 06/14/23 0630 06/15/23 0405 06/16/23 0437 06/17/23 0132 06/18/23 0326 06/19/23 0327  WBC 10.7* 6.8 4.4 4.8 4.6 5.0  NEUTROABS 9.0*  --   --   --   --   --   HGB 15.0 13.6 13.6 13.6 13.8 15.4  HCT 46.3 41.5 41.8 40.9 42.7 47.3  MCV 93.9 93.0 93.7 93.0 93.0 93.1  PLT 223 190 176 207 224 254   Basic Metabolic Panel: Recent Labs  Lab 06/16/23 0437 06/17/23 0846 06/18/23 0326 06/19/23 0327 06/20/23 0336  NA 133* 134* 136 133* 136  K 3.8 3.7 3.3* 3.6 4.1  CL 102 103 100 99 100  CO2 22 20* 25 23 24   GLUCOSE 97 106* 111* 105* 122*  BUN 19 19 22* 25* 31*  CREATININE 1.28* 1.29* 1.30* 1.66*  1.85*  CALCIUM 8.2* 8.5* 8.2* 8.2* 8.9  MG 1.8  --  1.9 1.9  --   PHOS 3.3  --   --   --   --    GFR: Estimated Creatinine Clearance: 65.4 mL/min (A) (by C-G formula based on SCr of 1.85 mg/dL (H)). Liver Function Tests: Recent Labs  Lab 06/15/23 0405  AST 15  ALT 15  ALKPHOS 48  BILITOT 1.2  PROT 7.3  ALBUMIN 3.2*   No results for input(s): "LIPASE", "AMYLASE" in the last 168 hours. No results for input(s): "AMMONIA" in the last 168 hours. Coagulation Profile: Recent Labs  Lab 06/16/23 0437 06/17/23 0132 06/18/23 0326 06/19/23 0327 06/20/23 0336  INR 1.8* 2.0* 2.2* 2.8* 2.7*   Cardiac Enzymes: No results for input(s): "CKTOTAL", "CKMB", "CKMBINDEX", "TROPONINI" in the last 168 hours. BNP (last 3 results) No results for input(s): "PROBNP" in the last 8760 hours. HbA1C: No results for input(s): "HGBA1C" in the last 72 hours.  CBG: Recent Labs  Lab 06/19/23 0938 06/19/23 1241 06/19/23 1741 06/20/23 0741 06/20/23 1323  GLUCAP 130* 140* 131* 114* 136*   Lipid Profile: No results for input(s): "CHOL", "HDL", "LDLCALC", "TRIG", "CHOLHDL", "LDLDIRECT" in the last 72 hours. Thyroid Function Tests: No results for input(s): "TSH", "T4TOTAL", "FREET4", "T3FREE", "THYROIDAB" in the last 72 hours. Anemia Panel: No results for input(s): "VITAMINB12", "FOLATE", "FERRITIN", "TIBC", "IRON", "RETICCTPCT" in the last 72 hours. Most Recent Urinalysis On File:     Component Value Date/Time   COLORURINE YELLOW (A) 06/14/2023 0827   APPEARANCEUR CLEAR (A) 06/14/2023 0827   APPEARANCEUR Clear 03/18/2022 1436   LABSPEC 1.037 (H) 06/14/2023 0827   LABSPEC 1.012 12/25/2014 1655   PHURINE 5.0 06/14/2023 0827   GLUCOSEU NEGATIVE 06/14/2023 0827   GLUCOSEU Negative 12/25/2014 1655   HGBUR NEGATIVE 06/14/2023 0827   BILIRUBINUR NEGATIVE 06/14/2023 0827   BILIRUBINUR Negative 03/18/2022 1436   BILIRUBINUR Negative 12/25/2014 1655   KETONESUR NEGATIVE 06/14/2023 0827   PROTEINUR  >=300 (A) 06/14/2023 0827   NITRITE NEGATIVE 06/14/2023 0827   LEUKOCYTESUR NEGATIVE 06/14/2023 0827   LEUKOCYTESUR Negative 12/25/2014 1655   Sepsis Labs: @LABRCNTIP (procalcitonin:4,lacticidven:4) Microbiology: Recent Results (from the past 240 hour(s))  Resp panel by RT-PCR (RSV, Flu A&B, Covid) Anterior Nasal Swab     Status: None   Collection Time: 06/14/23  6:32 AM   Specimen: Anterior Nasal Swab  Result Value Ref Range Status   SARS Coronavirus 2 by RT PCR NEGATIVE NEGATIVE Final    Comment: (NOTE) SARS-CoV-2 target nucleic acids are NOT DETECTED.  The SARS-CoV-2 RNA is generally detectable in upper respiratory specimens during the acute phase of infection. The lowest concentration of SARS-CoV-2 viral copies this assay can detect is 138 copies/mL. A negative result does not preclude SARS-Cov-2 infection and should not be used as the sole basis for treatment or other patient management decisions. A negative result may occur with  improper specimen collection/handling, submission of specimen other than nasopharyngeal swab, presence of viral mutation(s) within the areas targeted by this assay, and inadequate number of viral copies(<138 copies/mL). A negative result must be combined with clinical observations, patient history, and epidemiological information. The expected result is Negative.  Fact Sheet for Patients:  BloggerCourse.com  Fact Sheet for Healthcare Providers:  SeriousBroker.it  This test is no  t yet approved or cleared by the [place] and  has been authorized for detection and/or diagnosis of SARS-CoV-2 by FDA under an Emergency Use Authorization (EUA). This EUA will remain  in effect (meaning this test can be used) for the duration of the COVID-19 declaration under Section 564(b)(1) of the Act, 21 U.S.C.section 360bbb-3(b)(1), unless the authorization is terminated  or revoked sooner.        Influenza A by PCR NEGATIVE NEGATIVE Final   Influenza B by PCR NEGATIVE NEGATIVE Final    Comment: (NOTE) The Xpert Xpress SARS-CoV-2/FLU/RSV plus assay is intended as an aid in the diagnosis of influenza from Nasopharyngeal swab specimens and should not be used as a sole basis for treatment. Nasal washings and aspirates are unacceptable for Xpert Xpress SARS-CoV-2/FLU/RSV testing.  Fact Sheet for Patients: BloggerCourse.com  Fact Sheet for Healthcare Providers: SeriousBroker.it  This test is not yet approved or cleared by the [place] FDA and has been authorized for detection and/or diagnosis of SARS-CoV-2 by FDA under an Emergency Use Authorization (EUA). This EUA will remain in effect (meaning this test can be used) for the duration of the COVID-19 declaration under Section 564(b)(1) of the Act, 21 U.S.C. section 360bbb-3(b)(1), unless the authorization is terminated or revoked.     Resp Syncytial Virus by PCR NEGATIVE NEGATIVE Final    Comment: (NOTE) Fact Sheet for Patients: BloggerCourse.com  Fact Sheet for Healthcare Providers: SeriousBroker.it  This test is not yet approved or cleared by the [place] FDA and has been authorized for detection and/or diagnosis of SARS-CoV-2 by FDA under an Emergency Use Authorization (EUA). This EUA will remain in effect (meaning this test can be used) for the duration of the COVID-19 declaration under Section 564(b)(1) of the Act, 21 U.S.C. section 360bbb-3(b)(1), unless the authorization is terminated or revoked.  Performed at [facility], [address] [place], [place] [address]   Blood Culture (routine x 2)     Status: None   Collection Time: 06/14/23  8:27 AM   Specimen: BLOOD  Result Value Ref Range Status   Specimen Description BLOOD RIGHT HAND  Final   Special Requests   Final    BOTTLES DRAWN AEROBIC  AND ANAEROBIC Blood Culture adequate volume   Culture   Final    NO GROWTH 5 DAYS Performed at [facility], [address], [place], [place] [address]    Report Status 06/19/2023 FINAL  Final  Blood Culture (routine x 2)     Status: None   Collection Time: 06/14/23  8:27 AM   Specimen: BLOOD  Result Value Ref Range Status   Specimen Description BLOOD RIGHT ARM  Final   Special Requests   Final    BOTTLES DRAWN AEROBIC AND ANAEROBIC Blood Culture adequate volume   Culture   Final    NO GROWTH 5 DAYS Performed at [facility], [address] [place], [place] [address]    Report Status 06/19/2023 FINAL  Final  MRSA Next Gen by PCR, Nasal     Status: None   Collection Time: 06/14/23 10:45 AM   Specimen: Nasal Mucosa; Nasal Swab  Result Value Ref Range Status   MRSA by PCR Next Gen NOT DETECTED NOT DETECTED Final    Comment: (NOTE) The GeneXpert MRSA Assay (FDA approved for NASAL specimens only), is one component of a comprehensive MRSA colonization surveillance program. It is not intended to diagnose MRSA infection nor to guide or monitor treatment for MRSA  infections. Test performance is not FDA approved in patients less than 46 years old. Performed at [facility], [address], [place], [place] [address]       Radiology Studies last 3 days: No results found.      Sunnie Nielsen, DO [facility] 06/20/2023, 1:36 PM    Dictation software may have been used to generate the above note. Typos may occur and escape review in typed/dictated notes. Please contact Dr Lyn Hollingshead directly for clarity if needed.  Staff may message me via secure chat in Epic  but this may not receive an immediate response,  please page me for urgent matters!  If 7PM-7AM, please contact night coverage [email or web address]

## 2023-06-20 NOTE — Plan of Care (Signed)
  Problem: Education: Goal: Knowledge of General Education information will improve Description: Including pain rating scale, medication(s)/side effects and non-pharmacologic comfort measures Outcome: Progressing   Problem: Health Behavior/Discharge Planning: Goal: Ability to manage health-related needs will improve Outcome: Progressing   Problem: Nutrition: Goal: Adequate nutrition will be maintained Outcome: Progressing   

## 2023-06-21 ENCOUNTER — Other Ambulatory Visit: Payer: Self-pay

## 2023-06-21 ENCOUNTER — Inpatient Hospital Stay: Payer: No Typology Code available for payment source | Admitting: Certified Registered"

## 2023-06-21 ENCOUNTER — Ambulatory Visit
Admission: RE | Admit: 2023-06-21 | Discharge: 2023-06-21 | Disposition: A | Discharge status: Home / Self Care | Payer: No Typology Code available for payment source | Source: Ambulatory Visit | Attending: Pulmonary Disease | Admitting: Pulmonary Disease

## 2023-06-21 ENCOUNTER — Encounter
Admission: EM | Disposition: A | Discharge status: Home / Self Care | Payer: Self-pay | Source: Home / Self Care | Attending: Osteopathic Medicine

## 2023-06-21 ENCOUNTER — Ambulatory Visit: Payer: No Typology Code available for payment source | Admitting: Physician Assistant

## 2023-06-21 ENCOUNTER — Inpatient Hospital Stay
Admit: 2023-06-21 | Discharge: 2023-06-21 | Disposition: A | Discharge status: Home / Self Care | Payer: No Typology Code available for payment source | Attending: Cardiology

## 2023-06-21 DIAGNOSIS — R0602 Shortness of breath: Secondary | ICD-10-CM

## 2023-06-21 DIAGNOSIS — I483 Typical atrial flutter: Secondary | ICD-10-CM | POA: Diagnosis not present

## 2023-06-21 DIAGNOSIS — J9621 Acute and chronic respiratory failure with hypoxia: Secondary | ICD-10-CM | POA: Diagnosis not present

## 2023-06-21 DIAGNOSIS — Z86711 Personal history of pulmonary embolism: Secondary | ICD-10-CM

## 2023-06-21 HISTORY — PX: CARDIOVERSION: SHX1299

## 2023-06-21 HISTORY — PX: TEE WITHOUT CARDIOVERSION: SHX5443

## 2023-06-21 LAB — CBC
HCT: 45.3 % (ref 39.0–52.0)
Hemoglobin: 14.8 g/dL (ref 13.0–17.0)
MCH: 30.1 pg (ref 26.0–34.0)
MCHC: 32.7 g/dL (ref 30.0–36.0)
MCV: 92.3 fL (ref 80.0–100.0)
Platelets: 309 10*3/uL (ref 150–400)
RBC: 4.91 MIL/uL (ref 4.22–5.81)
RDW: 15.9 % — ABNORMAL HIGH (ref 11.5–15.5)
WBC: 4.7 10*3/uL (ref 4.0–10.5)
nRBC: 0 % (ref 0.0–0.2)

## 2023-06-21 LAB — ECHOCARDIOGRAM COMPLETE
Height: 67 in
Weight: 5337.6 [oz_av]

## 2023-06-21 LAB — BASIC METABOLIC PANEL
Anion gap: 10 (ref 5–15)
BUN: 36 mg/dL — ABNORMAL HIGH (ref 6–20)
CO2: 24 mmol/L (ref 22–32)
Calcium: 8.6 mg/dL — ABNORMAL LOW (ref 8.9–10.3)
Chloride: 101 mmol/L (ref 98–111)
Creatinine, Ser: 1.75 mg/dL — ABNORMAL HIGH (ref 0.61–1.24)
GFR, Estimated: 46 mL/min — ABNORMAL LOW (ref >60)
Glucose, Bld: 124 mg/dL — ABNORMAL HIGH (ref 70–99)
Potassium: 4 mmol/L (ref 3.5–5.1)
Sodium: 135 mmol/L (ref 135–145)

## 2023-06-21 LAB — PROTIME-INR
INR: 3.3 — ABNORMAL HIGH (ref 0.8–1.2)
Prothrombin Time: 33.5 s — ABNORMAL HIGH (ref 11.4–15.2)

## 2023-06-21 LAB — GLUCOSE, CAPILLARY
Glucose-Capillary: 134 mg/dL — ABNORMAL HIGH (ref 70–99)
Glucose-Capillary: 122 mg/dL — ABNORMAL HIGH (ref 70–99)
Glucose-Capillary: 110 mg/dL — ABNORMAL HIGH (ref 70–99)

## 2023-06-21 SURGERY — ECHOCARDIOGRAM, TRANSESOPHAGEAL
Anesthesia: General

## 2023-06-21 MED ORDER — TRIPLE ANTIBIOTIC 3.5-400-5000 EX OINT
1.0000 | TOPICAL_OINTMENT | Freq: Three times a day (TID) | CUTANEOUS | Status: DC
  Administered 2023-06-21 – 2023-06-23 (×6): 1 via CUTANEOUS
  Filled 2023-06-21 (×7): qty 1

## 2023-06-21 MED ORDER — BUTAMBEN-TETRACAINE-BENZOCAINE 2-2-14 % EX AERO
INHALATION_SPRAY | CUTANEOUS | Status: DC | PRN
  Administered 2023-06-21: 1 via TOPICAL

## 2023-06-21 MED ORDER — WARFARIN SODIUM 5 MG PO TABS
5.0000 mg | ORAL_TABLET | Freq: Every day | ORAL | Status: DC
  Administered 2023-06-22: 5 mg via ORAL
  Filled 2023-06-21 (×2): qty 1

## 2023-06-21 MED ORDER — ACETAMINOPHEN 325 MG PO TABS: 650.0000 mg | ORAL_TABLET | Freq: Once | ORAL | Status: DC | PRN

## 2023-06-21 MED ORDER — LIDOCAINE HCL URETHRAL/MUCOSAL 2 % EX GEL
CUTANEOUS | Status: DC | PRN
  Administered 2023-06-21: 1 via TOPICAL

## 2023-06-21 MED ORDER — PROPOFOL 10 MG/ML IV BOLUS
INTRAVENOUS | Status: DC | PRN
  Administered 2023-06-21 (×2): 50 mg via INTRAVENOUS
  Administered 2023-06-21 (×3): 20 mg via INTRAVENOUS

## 2023-06-21 MED ORDER — LIDOCAINE VISCOUS HCL 2 % MT SOLN
OROMUCOSAL | Status: AC
  Filled 2023-06-21: qty 15

## 2023-06-21 MED ORDER — PHENYLEPHRINE HCL (PRESSORS) 10 MG/ML IV SOLN
INTRAVENOUS | Status: DC | PRN
  Administered 2023-06-21 (×2): 100 ug via INTRAVENOUS

## 2023-06-21 MED ORDER — BUTAMBEN-TETRACAINE-BENZOCAINE 2-2-14 % EX AERO
INHALATION_SPRAY | CUTANEOUS | Status: AC
  Filled 2023-06-21: qty 5

## 2023-06-21 MED ORDER — PROPOFOL 10 MG/ML IV BOLUS
INTRAVENOUS | Status: AC
  Filled 2023-06-21: qty 20

## 2023-06-21 MED ORDER — CEPHALEXIN 500 MG PO CAPS
500.0000 mg | ORAL_CAPSULE | Freq: Three times a day (TID) | ORAL | Status: DC
  Administered 2023-06-21 – 2023-06-23 (×6): 500 mg via ORAL
  Filled 2023-06-21 (×6): qty 1

## 2023-06-21 MED ORDER — MIDAZOLAM HCL 2 MG/2ML IJ SOLN
INTRAMUSCULAR | Status: AC
  Filled 2023-06-21: qty 2

## 2023-06-21 MED ORDER — WARFARIN SODIUM 2 MG PO TABS
2.0000 mg | ORAL_TABLET | Freq: Once | ORAL | Status: AC
  Administered 2023-06-21: 2 mg via ORAL
  Filled 2023-06-21: qty 1

## 2023-06-21 MED ORDER — EPHEDRINE SULFATE (PRESSORS) 50 MG/ML IJ SOLN
INTRAMUSCULAR | Status: DC | PRN
  Administered 2023-06-21 (×2): 10 mg via INTRAVENOUS

## 2023-06-21 MED ORDER — ONDANSETRON HCL 4 MG/2ML IJ SOLN: 4.0000 mg | Freq: Once | INTRAMUSCULAR | Status: DC | PRN

## 2023-06-21 MED ORDER — HYALURONIDASE HUMAN 150 UNIT/ML IJ SOLN
150.0000 [IU] | Freq: Once | INTRAMUSCULAR | Status: AC
  Administered 2023-06-21: 150 [IU] via SUBCUTANEOUS
  Filled 2023-06-21: qty 1

## 2023-06-21 MED ORDER — ACETAMINOPHEN 160 MG/5ML PO SOLN
325.0000 mg | ORAL | Status: DC | PRN
Start: 2023-06-21 — End: 2023-06-21

## 2023-06-21 MED ORDER — MIDAZOLAM HCL 2 MG/2ML IJ SOLN
INTRAMUSCULAR | Status: DC | PRN
  Administered 2023-06-21: 2 mg via INTRAVENOUS

## 2023-06-21 NOTE — CV Procedure (Signed)
TRANSESOPHAGEAL ECHOCARDIOGRAM GUIDED DIRECT CURRENT CARDIOVERSION  NAME:  Ryan [name]    MRN: [id number] DOB:  [birth year]    ADMIT DATE: 06/14/2023  INDICATIONS: Symptomatic atrial flutter  PROCEDURE:   Informed consent was obtained prior to the procedure. The risks, benefits and alternatives for the procedure were discussed and the patient comprehended these risks.  Risks include, but are not limited to, cough, sore throat, vomiting, nausea, somnolence, esophageal and stomach trauma or perforation, bleeding, low blood pressure, aspiration, pneumonia, infection, trauma to the teeth and death.    After a procedural time-out, the oropharynx was anesthetized and the patient was sedated by the anesthesia service. The transesophageal probe was inserted in the esophagus and stomach without difficulty and multiple views were obtained. Sedation by anesthesia.   COMPLICATIONS:    Complications: No complications Patient tolerated procedure well.  KEY FINDINGS:  Mildy reduced LV function Dilated RV with severely reduced function Mild MR/Mild TR Dilated RA No LAA thrombus.  Full Report to follow.   CARDIOVERSION:     Indications:  Symptomatic Atrial Flutter  Procedure Details:  Once the TEE was complete, the patient had the defibrillator pads placed in the anterior and posterior position. Once an appropriate level of sedation was confirmed, the patient was cardioverted x 1 with 200J of biphasic synchronized energy.  The patient converted to NSR.  There were no apparent complications.  The patient had normal neuro status and respiratory status post procedure with vitals stable as recorded elsewhere.  Adequate airway was maintained throughout and vital signs monitored per protocol.  Shiasia Porro Advanced Heart Failure 12:52 PM

## 2023-06-21 NOTE — Transfer of Care (Signed)
Immediate Anesthesia Transfer of Care Note  Patient: Ryan [name]  Procedure(s) Performed: TRANSESOPHAGEAL ECHOCARDIOGRAM (TEE) CARDIOVERSION  Patient Location: PACU and special recovery room 15  Anesthesia Type:MAC  Level of Consciousness: awake and alert   Airway & Oxygen Therapy: Patient Spontanous Breathing and Patient connected to nasal cannula oxygen  Post-op Assessment: Report given to RN and Post -op Vital signs reviewed and stable  Post vital signs: Reviewed and stable  Last Vitals:  Vitals Value Taken Time  BP 100/58   Temp    Pulse 78   Resp 25   SpO2 92     Last Pain:  Vitals:   06/21/23 1206  TempSrc:   PainSc: 0-No pain         Complications: No notable events documented.

## 2023-06-21 NOTE — Anesthesia Preprocedure Evaluation (Signed)
Anesthesia Evaluation  Patient identified by MRN, date of birth, ID band Patient awake    Reviewed: Allergy & Precautions, NPO status , Patient's Chart, lab work & pertinent test results  History of Anesthesia Complications Negative for: history of anesthetic complications  Airway Mallampati: III  TM Distance: >3 FB Neck ROM: Full    Dental no notable dental hx. 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, diabetes, hypertension, CKD who presents with acute on chronic shortness of breath with likely superimposed sepsis    Neuro/Psych negative neurological ROS  negative psych ROS   GI/Hepatic ,neg GERD  ,,(+)     (-) substance abuse    Endo/Other  diabetes  Morbid obesity  Renal/GU CRFRenal disease     Musculoskeletal   Abdominal  (+) + obese  Peds  Hematology   Anesthesia Other Findings Past Medical History: No date: (HFpEF) heart failure with preserved ejection fraction (HCC) No date: Arrhythmia     Comment:  atrial fibrillation No date: CHF (congestive heart failure) (HCC) No date: Diabetes mellitus type II, non insulin dependent (HCC) No date: Diabetes mellitus without complication (HCC)     Comment:  per pt-pre diabetic-Dr Dario Guardian stated said pt No date: DVT (deep venous thrombosis) (HCC)     Comment:  Over 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  Regular the workup has not been               forthcoming) previously followed  by Dr. Isaiah Serge at [facility] No date: Hepatic steatosis No date: Hypertension No date: Morbid obesity with BMI of 50.0-59.9, adult (HCC) No date: Oxygen deficiency No date: Peripheral vascular disease (HCC) No date: Presence of IVC filter     Comment:  Has 2 IVC filters with significant thrombus burden               superior to the filter. 12/2016: Pulmonary embolus (HCC)     Comment:  Initially just treated with anticoagulation, but in               February 2023 in April 2024 treated with EKOS               thrombectomy for submassive PE. No date: Ventricular trigeminy No date: Weakness of both lower limbs  Reproductive/Obstetrics                             Anesthesia Physical Anesthesia Plan  ASA: 4  Anesthesia Plan: General   Post-op Pain Management: Minimal or no pain anticipated   Induction: Intravenous  PONV Risk Score and Plan: 3 and 2 and Propofol infusion, TIVA, Ondansetron and Midazolam  Airway Management Planned: Nasal CPAP and Natural Airway  Additional Equipment: None  Intra-op Plan:   Post-operative Plan:   Informed Consent: I have reviewed the patients History and Physical, chart, labs and discussed the procedure including the risks, benefits and alternatives for the proposed anesthesia with the patient or authorized representative who has indicated his/her understanding and acceptance.     Dental  advisory given  Plan Discussed with: CRNA and Surgeon  Anesthesia Plan Comments: (Discussed risks of anesthesia with patient, including possibility of difficulty with spontaneous ventilation under anesthesia necessitating airway intervention, PONV, and rare risks such as cardiac or respiratory or neurological events, and allergic reactions. Discussed the role of CRNA in patient's perioperative care. Patient understands. Patient counseled on being higher risk for anesthesia due to comorbidities: morbid obesity, CHF, pulmonary HTN. Patient was told  about increased risk of cardiac and respiratory events, including death. )       Anesthesia Quick Evaluation

## 2023-06-21 NOTE — Anesthesia Postprocedure Evaluation (Signed)
Anesthesia Post Note  Patient: RUFINO TACK  Procedure(s) Performed: TRANSESOPHAGEAL ECHOCARDIOGRAM (TEE) CARDIOVERSION  Patient location during evaluation: PACU Anesthesia Type: General Level of consciousness: awake and alert, oriented and patient cooperative Pain management: pain level controlled Vital Signs Assessment: post-procedure vital signs reviewed and stable Respiratory status: spontaneous breathing, nonlabored ventilation and respiratory function stable Cardiovascular status: blood pressure returned to baseline and stable Postop Assessment: adequate PO intake Anesthetic complications: no   No notable events documented.   Last Vitals:  Vitals:   06/21/23 1311 06/21/23 1324  BP: 106/60   Pulse: 76   Resp: (!) 25   Temp:    SpO2: 93% 93%    Last Pain:  Vitals:   06/21/23 1315  TempSrc:   PainSc: 0-No pain                 Reed Breech

## 2023-06-21 NOTE — Progress Notes (Signed)
PROGRESS NOTE    Ryan [name]   UJW:[id number] DOB: [birth year]  DOA: 06/14/2023 Date of Service: 06/21/23 which is hospital day 7  PCP: Ryan Grooms, NP    HPI: Ryan [name] is a 52 y.o. 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 Presented to ED 06/14/23 w/ respiratory distress, SOB. Of note, recent admission requiring mechanical thrombectomy 05/17/23, chronically on coumadin. No chest pain. Had mild cough, nausea/vomiting, diarrhea over few hours prior to arrival   Hospital course / significant events:  10/15: to ED, CTA chest (+)1 acute to subacute new PE in LUL, remainder of PE is chronic, chronic RV strain. Subtherapeutic INR 1.6. Requiring 4L O2 [name]. (+)chronic wound LLE w/ a foul odor, abx initiated. Concern for acute CHF. Vascular surgery consulted - continue heparin but no procedures planned for now, recs for diuresis and reevaluate. Hematology consulted - continue heparin bridging to coumadin w/ INR goal 2.5-3.  10/16: still tachypneic, on 4L O2. Cardiology following, increased diuresis. pharmacy following for coumadin bridging to goal INR .  10/17-10/18: BCx NG.  INR still subtherapeutic, continue heparin. Weaning off O2.  10/19: AFlutter RVR requiring amiodarone gtt, restarted beta blocker  10/20: remains Aflutter/rapid but improved, staying on amio gtt for today 10/21 continue amio, cardiology planning TEE/DCCV tomorrow  10/22: TEE/DCCV w/ conversion to sinus rhythm  Consultants:  Vascular Surgery Hematology/Oncology  Cardiology    Procedures/Surgeries: 06/21/23 TEE/DCCV w/ conversion to sinus rhythm      ASSESSMENT & PLAN:   Acute on chronic respiratory failure with hypoxia Ddx: CHF exacerbation / R heart strain, acute on chronic PE but  this is fairly mild, sepsis d/t LE wound, restrictive lung disease d/t habitus  Treating underlying causes as below  Supplemental O2    Hx paroxysmal Afib Aflutter w/ RVR morning 06/18/23  Given IV labetalol with spontaneous conversion to sinus rhythm briefly but later on reverted to atrial flutter.  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 2D echo 05/20/2023 with a EF of 60 to 65% torsemide 40 mg daily - held d/t AKI can hopefully restart in 1-2 days spironolactone 25 mg daily  Entresto 24/26  Lopressor 12.5 mg every 6 hours  Cardiology following Will need outpatient R heart cath  Will need outpatient sleep study  Strict ins and outs and daily weights   SIRS d/t PE/CHF, questions severe sepsis d/t acute on chronic lower extremity wound with new ulcer formation and foul-smelling odor Sepsis ruled out clinically, suspect SIRS d/t PE/CHF  Meeting SIRS vs severe sepsis criteria with temperature of 101, respirations in the upper 20s, lactate noted be greater than 2 Plain film imaging of the left lower extremity - no concerns, (+)edema  ESR and sed rate --> no concerns  Rocephin, Flagyl - d/c (completion 5 days)  Panculture - NG thus far  Wound care consult - follow  dressing instructions: Clean L lower medial leg ulcer with NS, apply silver hydrofiber ([employer]qu[employer] [place] [phone]) cut to fit wound bed every other day, cover with dry gauze or ABD pad. Wrap leg with Kerlix roll gauze beginning just above toes and ending right below knee.  Secure with Coban wrapped in same fashion as Kerlix for some compression. Moisten silver w/ NS to facilitate removal     Sepsis d/t cellulitis, PA, Completed abx Now w/ small area of questionable developing cellulitis vs peripheral vascular ulceration / stasis dermatitis - see photo Warm compresses Bacitracin topical  Keflex  AKI, mild  On CKD (chronic kidney disease), stage IIIa Likely related to diuresis Holding diuretics yesterday --> improved Cr  Repeat BMP in AM  Type 2 diabetes mellitus with diabetic chronic kidney disease (HCC) SSI A1c   Essential hypertension BP stable Monitor and meds as above    Elevated troponin Troponin 40s to 50s Suspect secondary mild demand ischemia in the setting of acute on chronic PE, acute on chronic HFpEF as well as sepsis Repeat if chest pain    Dizziness - resolved No chest pain/SOB, no neuro deficits  Meclizine prn Further w/u if persists    Morbid obesity based on BMI: Body mass index is 52.14 kg/m.  Complicates clinical prognosis   DVT prophylaxis: heparin/coumadin IV fluids: no continuous IV fluids  Nutrition: carb/cardiac Central lines / invasive devices: none  Code Status: FULL CODE ACP documentation reviewed: 06/21/23 and none on file in [facility]  TOC needs: TBD, expect may need to discharge on continuous O2 rather than hs  Barriers to dispo / significant pending items: monitoring following DCCV today, clear w/ cardiology prior to dc              Subjective / Brief ROS:  Patient reports painful spot on L lower leg Ambulating independently but SOB w/ ambulation  Denies CP/SOB at rest Denies LE pain/edema, reports legs are at baseline  Pain controlled.  Denies new weakness.  Tolerating diet.   Family Communication: none at this time     Objective Findings:  Vitals:   06/21/23 1324 06/21/23 1330 06/21/23 1355 06/21/23 1400  BP:  92/68 (!) 82/59   Pulse:  74    Resp:  (!) 30 (!) 30 (!) 26  Temp:       TempSrc:      SpO2: 93% 91% 92%   Weight:      Height:        Intake/Output Summary (Last 24 hours) at 06/21/2023 1520 Last data filed at 06/21/2023 1420 Gross per 24 hour  Intake 640 ml  Output 1320 ml  Net -680 ml   Filed Weights   06/19/23 0422 06/20/23 0500 06/21/23 1206  Weight: (!) 151.4 kg (!) 151.3 kg (!) 151 kg    Examination:  Physical Exam Constitutional:      General: He is not in acute distress.    Appearance: He is obese.  Cardiovascular:     Rate and Rhythm: Regular rhythm. Tachycardia present.  Pulmonary:     Effort: Pulmonary effort is normal.     Breath sounds: Normal breath sounds.  Abdominal:     Tenderness: There is no abdominal tenderness.  Musculoskeletal:     Right lower leg: Edema present.     Left lower leg: Edema present.  Skin:    General: Skin is dry.     Findings: Lesion (see photo, tender mild erythematous area on lateral LLE) present.  Neurological:  General: No focal deficit present.     Mental Status: He is alert and oriented to person, place, and time.  Psychiatric:        Mood and Affect: Mood normal.        Behavior: Behavior normal.          Scheduled Medications:   vitamin C  500 mg Oral BID   cephALEXin  500 mg Oral Q8H   Chlorhexidine Gluconate Cloth  6 each Topical Daily   insulin aspart  0-9 Units Subcutaneous TID WC   metoprolol tartrate  25 mg Oral Q6H   multivitamin with minerals  1 tablet Oral Daily   neomycin-bacitracin-polymyxin  1 Application Apply externally TID   nutrition supplement (JUVEN)  1 packet Oral BID BM   Ensure Max Protein  11 oz Oral BID   sacubitril-valsartan  1 tablet Oral BID   spironolactone  25 mg Oral Daily   warfarin  2 mg Oral ONCE-1600   [START ON 06/22/2023] warfarin  5 mg Oral q1600   Warfarin - Pharmacist Dosing Inpatient   Does not apply q1600    Continuous Infusions:    PRN Medications:  mouth rinse  Antimicrobials from admission:  Anti-infectives (From  admission, onward)    Start     Dose/Rate Route Frequency Ordered Stop   06/21/23 1615  cephALEXin (KEFLEX) capsule 500 mg        500 mg Oral Every 8 hours 06/21/23 1519 06/24/23 1359   06/16/23 2200  metroNIDAZOLE (FLAGYL) IVPB 500 mg       Placed in "And" Linked Group   500 mg 100 mL/hr over 60 Minutes Intravenous Every 12 hours 06/16/23 1003 06/19/23 0700   06/16/23 1400  cefTRIAXone (ROCEPHIN) 2 g in sodium chloride 0.9 % 100 mL IVPB       Placed in "And" Linked Group   2 g 200 mL/hr over 30 Minutes Intravenous Every 24 hours 06/16/23 1003 06/18/23 1401   06/16/23 0600  vancomycin (VANCOREADY) IVPB 2000 mg/400 mL  Status:  Discontinued        2,000 mg 200 mL/hr over 120 Minutes Intravenous Every 24 hours 06/15/23 1342 06/16/23 0935   06/15/23 0600  vancomycin (VANCOREADY) IVPB 1750 mg/350 mL  Status:  Discontinued        1,750 mg 175 mL/hr over 120 Minutes Intravenous Every 24 hours 06/14/23 1316 06/15/23 1342   06/14/23 1500  vancomycin (VANCOREADY) IVPB 1500 mg/300 mL        1,500 mg 150 mL/hr over 120 Minutes Intravenous  Once 06/14/23 1316 06/14/23 1823   06/14/23 1400  cefTRIAXone (ROCEPHIN) 2 g in sodium chloride 0.9 % 100 mL IVPB  Status:  Discontinued       Placed in "And" Linked Group   2 g 200 mL/hr over 30 Minutes Intravenous Every 24 hours 06/14/23 1256 06/16/23 1003   06/14/23 1400  metroNIDAZOLE (FLAGYL) IVPB 500 mg  Status:  Discontinued       Placed in "And" Linked Group   500 mg 100 mL/hr over 60 Minutes Intravenous Every 8 hours 06/14/23 1256 06/16/23 1003   06/14/23 0900  vancomycin (VANCOCIN) IVPB 1000 mg/200 mL premix        1,000 mg 200 mL/hr over 60 Minutes Intravenous  Once 06/14/23 0858 06/14/23 1012           Data Reviewed:  I have personally reviewed the following...  CBC: Recent Labs  Lab 06/16/23 0437 06/17/23 0132 06/18/23 0326 06/19/23 0327 06/21/23  0442  WBC 4.4 4.8 4.6 5.0 4.7  HGB 13.6 13.6 13.8 15.4 14.8  HCT 41.8 40.9 42.7  47.3 45.3  MCV 93.7 93.0 93.0 93.1 92.3  PLT 176 207 224 254 309   Basic Metabolic Panel: Recent Labs  Lab 06/16/23 0437 06/17/23 0846 06/18/23 0326 06/19/23 0327 06/20/23 0336 06/21/23 0442  NA 133* 134* 136 133* 136 135  K 3.8 3.7 3.3* 3.6 4.1 4.0  CL 102 103 100 99 100 101  CO2 22 20* 25 23 24 24   GLUCOSE 97 106* 111* 105* 122* 124*  BUN 19 19 22* 25* 31* 36*  CREATININE 1.28* 1.29* 1.30* 1.66* 1.85* 1.75*  CALCIUM 8.2* 8.5* 8.2* 8.2* 8.9 8.6*  MG 1.8  --  1.9 1.9  --   --   PHOS 3.3  --   --   --   --   --    GFR: Estimated Creatinine Clearance: 69.1 mL/min (A) (by C-G formula based on SCr of 1.75 mg/dL (H)). Liver Function Tests: Recent Labs  Lab 06/15/23 0405  AST 15  ALT 15  ALKPHOS 48  BILITOT 1.2  PROT 7.3  ALBUMIN 3.2*   No results for input(s): "LIPASE", "AMYLASE" in the last 168 hours. No results for input(s): "AMMONIA" in the last 168 hours. Coagulation Profile: Recent Labs  Lab 06/17/23 0132 06/18/23 0326 06/19/23 0327 06/20/23 0336 06/21/23 0442  INR 2.0* 2.2* 2.8* 2.7* 3.3*   Cardiac Enzymes: No results for input(s): "CKTOTAL", "CKMB", "CKMBINDEX", "TROPONINI" in the last 168 hours. BNP (last 3 results) No results for input(s): "PROBNP" in the last 8760 hours. HbA1C: No results for input(s): "HGBA1C" in the last 72 hours.  CBG: Recent Labs  Lab 06/20/23 0741 06/20/23 1323 06/20/23 1857 06/20/23 2320 06/21/23 0940  GLUCAP 114* 136* 127* 98 134*   Lipid Profile: No results for input(s): "CHOL", "HDL", "LDLCALC", "TRIG", "CHOLHDL", "LDLDIRECT" in the last 72 hours. Thyroid Function Tests: No results for input(s): "TSH", "T4TOTAL", "FREET4", "T3FREE", "THYROIDAB" in the last 72 hours. Anemia Panel: No results for input(s): "VITAMINB12", "FOLATE", "FERRITIN", "TIBC", "IRON", "RETICCTPCT" in the last 72 hours. Most Recent Urinalysis On File:     Component Value Date/Time   COLORURINE YELLOW (A) 06/14/2023 0827   APPEARANCEUR CLEAR  (A) 06/14/2023 0827   APPEARANCEUR Clear 03/18/2022 1436   LABSPEC 1.037 (H) 06/14/2023 0827   LABSPEC 1.012 12/25/2014 1655   PHURINE 5.0 06/14/2023 0827   GLUCOSEU NEGATIVE 06/14/2023 0827   GLUCOSEU Negative 12/25/2014 1655   HGBUR NEGATIVE 06/14/2023 0827   BILIRUBINUR NEGATIVE 06/14/2023 0827   BILIRUBINUR Negative 03/18/2022 1436   BILIRUBINUR Negative 12/25/2014 1655   KETONESUR NEGATIVE 06/14/2023 0827   PROTEINUR >=300 (A) 06/14/2023 0827   NITRITE NEGATIVE 06/14/2023 0827   LEUKOCYTESUR NEGATIVE 06/14/2023 0827   LEUKOCYTESUR Negative 12/25/2014 1655   Sepsis Labs: @LABRCNTIP (procalcitonin:4,lacticidven:4) Microbiology: Recent Results (from the past 240 hour(s))  Resp panel by RT-PCR (RSV, Flu A&B, Covid) Anterior Nasal Swab     Status: None   Collection Time: 06/14/23  6:32 AM   Specimen: Anterior Nasal Swab  Result Value Ref Range Status   SARS Coronavirus 2 by RT PCR NEGATIVE NEGATIVE Final    Comment: (NOTE) SARS-CoV-2 target nucleic acids are NOT DETECTED.  The SARS-CoV-2 RNA is generally detectable in upper respiratory specimens during the acute phase of infection. The lowest concentration of SARS-CoV-2 viral copies this assay can detect is 138 copies/mL. A negative result does not preclude SARS-Cov-2 infection and should not be  used as the sole basis for treatment or other patient management decisions. A negative result may occur with  improper specimen collection/handling, submission of specimen other than nasopharyngeal swab, presence of viral mutation(s) within the areas targeted by this assay, and inadequate number of viral copies(<138 copies/mL). A negative result must be combined with clinical observations, patient history, and epidemiological information. The expected result is Negative.  Fact Sheet for Patients:  BloggerCourse.com  Fact Sheet for Healthcare Providers:  SeriousBroker.it  This test  is no t yet approved or cleared by the [place] FDA and  has been authorized for detection and/or diagnosis of SARS-CoV-2 by FDA under an Emergency Use Authorization (EUA). This EUA will remain  in effect (meaning this test can be used) for the duration of the COVID-19 declaration under Section 564(b)(1) of the Act, 21 U.S.C.section 360bbb-3(b)(1), unless the authorization is terminated  or revoked sooner.       Influenza A by PCR NEGATIVE NEGATIVE Final   Influenza B by PCR NEGATIVE NEGATIVE Final    Comment: (NOTE) The Xpert Xpress SARS-CoV-2/FLU/RSV plus assay is intended as an aid in the diagnosis of influenza from Nasopharyngeal swab specimens and should not be used as a sole basis for treatment. Nasal washings and aspirates are unacceptable for Xpert Xpress SARS-CoV-2/FLU/RSV testing.  Fact Sheet for Patients: BloggerCourse.com  Fact Sheet for Healthcare Providers: SeriousBroker.it  This test is not yet approved or cleared by the [place] FDA and has been authorized for detection and/or diagnosis of SARS-CoV-2 by FDA under an Emergency Use Authorization (EUA). This EUA will remain in effect (meaning this test can be used) for the duration of the COVID-19 declaration under Section 564(b)(1) of the Act, 21 U.S.C. section 360bbb-3(b)(1), unless the authorization is terminated or revoked.     Resp Syncytial Virus by PCR NEGATIVE NEGATIVE Final    Comment: (NOTE) Fact Sheet for Patients: BloggerCourse.com  Fact Sheet for Healthcare Providers: SeriousBroker.it  This test is not yet approved or cleared by the [place] FDA and has been authorized for detection and/or diagnosis of SARS-CoV-2 by FDA under an Emergency Use Authorization (EUA). This EUA will remain in effect (meaning this test can be used) for the duration of the COVID-19 declaration under Section  564(b)(1) of the Act, 21 U.S.C. section 360bbb-3(b)(1), unless the authorization is terminated or revoked.  Performed at [facility], [address] [place], [place] [address]   Blood Culture (routine x 2)     Status: None   Collection Time: 06/14/23  8:27 AM   Specimen: BLOOD  Result Value Ref Range Status   Specimen Description BLOOD RIGHT HAND  Final   Special Requests   Final    BOTTLES DRAWN AEROBIC AND ANAEROBIC Blood Culture adequate volume   Culture   Final    NO GROWTH 5 DAYS Performed at [facility], [address], [place], [place] [address]    Report Status 06/19/2023 FINAL  Final  Blood Culture (routine x 2)     Status: None   Collection Time: 06/14/23  8:27 AM   Specimen: BLOOD  Result Value Ref Range Status   Specimen Description BLOOD RIGHT ARM  Final   Special Requests   Final    BOTTLES DRAWN AEROBIC AND ANAEROBIC Blood Culture adequate volume   Culture   Final    NO GROWTH 5 DAYS Performed at [facility], [address], [place], [place] [address]    Report Status 06/19/2023 FINAL  Final  MRSA Next Gen by PCR, Nasal     Status: None   Collection Time: 06/14/23 10:45 AM   Specimen: Nasal Mucosa; Nasal Swab  Result Value Ref Range Status   MRSA by PCR Next Gen NOT DETECTED NOT DETECTED Final    Comment: (NOTE) The GeneXpert MRSA Assay (FDA approved for NASAL specimens only), is one component of a comprehensive MRSA colonization surveillance program. It is not intended to diagnose MRSA infection nor to guide or monitor treatment for MRSA infections. Test performance is not FDA approved in patients less than 33 years old. Performed at [facility], [address] [place], [place] [address]       Radiology Studies last 3 days: No results found.      Sunnie Nielsen, DO [facility] 06/21/2023, 3:20 PM    Dictation software may have been used to generate the above note. Typos may occur and  escape review in typed/dictated notes. Please contact Dr Lyn Hollingshead directly for clarity if needed.  Staff may message me via secure chat in Epic  but this may not receive an immediate response,  please page me for urgent matters!  If 7PM-7AM, please contact night coverage [email or web address]

## 2023-06-21 NOTE — Consult Note (Signed)
Pharmacy Consult Note - Anticoagulation  Pharmacy Consult for heparin, warfarin Indication: pulmonary embolus (Subtherapeutic INR)  PATIENT MEASUREMENTS: Height: 5\' 7"  (170.2 cm) Weight: (!) 151.3 kg (333 lb 9.6 oz) IBW/kg (Calculated) : 66.1 HEPARIN DW (KG): 104.5  VITAL SIGNS: Temp: 97.7 F (36.5 C) (10/22 0356) Temp Source: Oral (10/22 0356) BP: 107/82 (10/22 0315) Pulse Rate: 120 (10/22 0356)  Recent Labs    06/19/23 0327 06/20/23 0336 06/21/23 0442  HGB 15.4  --  14.8  HCT 47.3  --  45.3  PLT 254  --  309  LABPROT 30.1*   < > 33.5*  INR 2.8*   < > 3.3*  HEPARINUNFRC 0.58  --   --   CREATININE 1.66*   < > 1.75*   < > = values in this interval not displayed.    Estimated Creatinine Clearance: 69.2 mL/min (A) (by C-G formula based on SCr of 1.75 mg/dL (H)).  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  Regular the workup has not been forthcoming) previously followed by Dr. Isaiah Serge at [facility]   Hepatic steatosis    Hypertension    Morbid obesity with BMI of 50.0-59.9, adult (HCC)    Oxygen deficiency    Peripheral vascular disease (HCC)    Presence of IVC filter    Has 2 IVC filters with significant thrombus burden superior to the filter.   Pulmonary embolus (HCC) 12/2016   Initially just treated with anticoagulation, but in February 2023 in April 2024 treated with EKOS thrombectomy for submassive PE.   Ventricular trigeminy    Weakness of both lower limbs     ASSESSMENT: 52 y.o. 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 CHADSVASc 4.   Patient is not a good candidate for DOAC due to CTEPH and failed therapies in the past. Will aim for an INR goal of 2.5 to 3 due to recurrent VTEs. Patient has a large body mass which may cause the need for higher warfarin doses. Patient was subtherapeutic at admission after running out of mediation at home.    DDI metronidazole and ceftriaxone - completed 10/20 DDI with amiodarone started 10/19 - needs close monitoring x 4 weeks until stable - expected 20-25% decrease in warfarin need.  Pertinent medications: PTA Warfarin 5 mg daily  Goal(s) of therapy: Heparin level 0.3 - 0.7 units/mL INR 2.5 - 3 Monitor platelets by anticoagulation protocol: Yes   Baseline anticoagulation labs: Recent Labs    06/19/23 0327 06/20/23 0336 06/21/23 0442  INR 2.8* 2.7* 3.3*  HGB 15.4  --  14.8  PLT 254  --  309   Heparin Plan: Heparin infusion stopped 10/20  Date INR Comment/Dose 10/15 1.6 Subtherapeutic  10/16 --- No INR, Warfarin 7.5 mg given 10/17 1.8 Subtherapeutic, 10 mg given 10/18  2.0 Subtherapeutic 10 mg 10/19  2.2 Subtherapeutic, 10 mg 10/20  2.8 Therapeutic, 8 mg 10/21   2.7 Therapeutic, warfarin 7.5mg   given 10/22  3.3  SUPRA-therapeutic  Plan: INR slightly above goal of 2.5-3.0 today. CBC also stable and no s/sx of bleeding noted. Subtherapeutic on admission due to compliance issues. Per conversation with patient is INR changes drastically with dose changes but with delay reaction. (need caution when boosting dose).  New DDI with metronidazole completed 10/20, new DDI with amiodarone stated 10/19 and HF exacerbation (fluid overload) also influencing INR. Patient will require close INR monitoring for the next 4 weeks until INR stable.  Change warfarin dose to 2mg  today x 1 today, then resume 5mg  daily on  10/23. Continue daily INR while inpatient CBC at least every 3 days.  Kamrin Spath Rodriguez-Guzman PharmD, BCPS 06/21/2023 7:58 AM

## 2023-06-21 NOTE — Plan of Care (Signed)

## 2023-06-21 NOTE — H&P (View-Only) (Signed)
Advanced Heart Failure Rounding Note  PCP-Cardiologist: None   Subjective:    - Shortness of breath now at baseline but becomes quickly fatigued with exertion.  - Telemetry with AFL with 2:1 conduction - Amiodarone infiltration into the forearm this AM.    Objective:   Weight Range: (!) 151.3 kg Body mass index is 52.25 kg/m.   Vital Signs:   Temp:  [97.7 F (36.5 C)-99.1 F (37.3 C)] 97.7 F (36.5 C) (10/22 0356) Pulse Rate:  [97-120] 120 (10/22 0356) Resp:  [16] 16 (10/21 1323) BP: (92-115)/(56-82) 107/82 (10/22 0315) SpO2:  [94 %-99 %] 99 % (10/22 0356) Last BM Date : 06/20/23  Weight change: Filed Weights   06/17/23 0622 06/19/23 0422 06/20/23 0500  Weight: (!) 156 kg (!) 151.4 kg (!) 151.3 kg    Intake/Output:   Intake/Output Summary (Last 24 hours) at 06/21/2023 1004 Last data filed at 06/21/2023 0500 Gross per 24 hour  Intake 446.53 ml  Output 1100 ml  Net -653.47 ml      Physical Exam    General:   No resp difficulty HEENT: Normal Neck: Supple. JVP 6-7; difficult to assess though Cor: PMI nondisplaced. Tachy Regular rate & rhythm. No rubs, gallops or murmurs. Lungs: CTA Abdomen: Soft, nontender, nondistended. No hepatosplenomegaly. No bruits or masses. Good bowel sounds. Extremities: No edema; chronic changes from venous congestion; chronic wounds Neuro: Alert & orientedx3, cranial nerves grossly intact. moves all 4 extremities w/o difficulty. Affect pleasant   Telemetry   Atrial Flutter 120s-130s  EKG    N/A  Labs    CBC Recent Labs    06/19/23 0327 06/21/23 0442  WBC 5.0 4.7  HGB 15.4 14.8  HCT 47.3 45.3  MCV 93.1 92.3  PLT 254 309   Basic Metabolic Panel Recent Labs    16/10/96 0327 06/20/23 0336 06/21/23 0442  NA 133* 136 135  K 3.6 4.1 4.0  CL 99 100 101  CO2 23 24 24   GLUCOSE 105* 122* 124*  BUN 25* 31* 36*  CREATININE 1.66* 1.85* 1.75*  CALCIUM 8.2* 8.9 8.6*  MG 1.9  --   --    Liver Function Tests No  results for input(s): "AST", "ALT", "ALKPHOS", "BILITOT", "PROT", "ALBUMIN" in the last 72 hours. No results for input(s): "LIPASE", "AMYLASE" in the last 72 hours. Cardiac Enzymes No results for input(s): "CKTOTAL", "CKMB", "CKMBINDEX", "TROPONINI" in the last 72 hours.  BNP: BNP (last 3 results) Recent Labs    07/04/22 0631 12/02/22 1700 06/14/23 0630  BNP 74.8 83.8 835.3*     Other results:   Imaging    No results found.   Medications:     Scheduled Medications:  vitamin C  500 mg Oral BID   Chlorhexidine Gluconate Cloth  6 each Topical Daily   insulin aspart  0-9 Units Subcutaneous TID WC   metoprolol tartrate  25 mg Oral Q6H   multivitamin with minerals  1 tablet Oral Daily   nutrition supplement (JUVEN)  1 packet Oral BID BM   Ensure Max Protein  11 oz Oral BID   sacubitril-valsartan  1 tablet Oral BID   spironolactone  25 mg Oral Daily   warfarin  2 mg Oral ONCE-1600   [START ON 06/22/2023] warfarin  5 mg Oral q1600   Warfarin - Pharmacist Dosing Inpatient   Does not apply q1600    Infusions:  sodium chloride 20 mL/hr at 06/20/23 1335   amiodarone 30 mg/hr (06/21/23 0232)  PRN Medications: mouth rinse    Patient Profile   Ryan [name] is a 52 y.o. 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 Echo 9//2024 EF 60-65%. RV moderately reduced. DVT back in 1994. PE diagnosed back in 2018. Had PE thrombectomy x 2 in 2024.  CTA - large chronic nonocclusive thrombus R Main PA + small chronic nonocclusive  distal left main and small embolus  segmental branch.   -Improvement in sCr today to 1.75 after holding diuretics; plan on TEE today. Will plan on restarting torsemide in 1-2 days.  - Continue spironolactone 25mg  daily - Continue entresto 24/26 - Failed DOAC in the past. On Coumadin with INR goal increased to 2.5-3.  - Will need outpatient RHC, hold for the time being given large clot burden - Sleep study at discharge - Autoimmune workup and hematology workup have been completed prior.  2. Sepsis: Presented with fever, elevated lactate, likely source is nonhealing lower extremity wound.  - Continue antibiotics per primary team  3. PAF-->A Flutter RVR  Previously on 25 mg Toprol XL daily. EKG on admit showed SR. Over the weekend converted to A Flutter RVR. Started on amio drip and 12.5 mg lopressor.   Remained in A fib RVR. Lopressor was increased to 25 mg every 6 hours.  On warfarin. INR 2.7. Of note INR has not been therapeutic over the last 30 days.  - Amiodarone infiltrated into the forearm; can discontinue at this time. Poor efficacy in the setting of atrial flutter.  - Plan on TEE/DCCV this afternoon.   4. AKI on CKD - Creatinine baseline ~ 1.4.  - Improving; sCr now 1.75 from 1.85.  - Continue to hold diuretics.   5. Obesity  - Body mass index is 52.25 kg/m.    Length of Stay: 7  [name], DO  06/21/2023, 10:04 AM  Advanced Heart Failure Team Pager [phone] (M-F; 7a - 5p)  Please contact [facility] for night-coverage after hours (5p -7a ) and weekends on [email or web address]

## 2023-06-21 NOTE — Plan of Care (Signed)
  Problem: Education: Goal: Knowledge of General Education information will improve Description: Including pain rating scale, medication(s)/side effects and non-pharmacologic comfort measures Outcome: Progressing   Problem: Clinical Measurements: Goal: Will remain free from infection Outcome: Progressing Goal: Diagnostic test results will improve Outcome: Progressing   Problem: Activity: Goal: Risk for activity intolerance will decrease Outcome: Progressing   

## 2023-06-21 NOTE — Progress Notes (Signed)
*  PRELIMINARY RESULTS* Echocardiogram Echocardiogram Transesophageal has been performed.  Cristela Blue 06/21/2023, 12:59 PM

## 2023-06-21 NOTE — Progress Notes (Signed)
Advanced Heart Failure Rounding Note  PCP-Cardiologist: None   Subjective:    - Shortness of breath now at baseline but becomes quickly fatigued with exertion.  - Telemetry with AFL with 2:1 conduction - Amiodarone infiltration into the forearm this AM.    Objective:   Weight Range: (!) 151.3 kg Body mass index is 52.25 kg/m.   Vital Signs:   Temp:  [97.7 F (36.5 C)-99.1 F (37.3 C)] 97.7 F (36.5 C) (10/22 0356) Pulse Rate:  [97-120] 120 (10/22 0356) Resp:  [16] 16 (10/21 1323) BP: (92-115)/(56-82) 107/82 (10/22 0315) SpO2:  [94 %-99 %] 99 % (10/22 0356) Last BM Date : 06/20/23  Weight change: Filed Weights   06/17/23 0622 06/19/23 0422 06/20/23 0500  Weight: (!) 156 kg (!) 151.4 kg (!) 151.3 kg    Intake/Output:   Intake/Output Summary (Last 24 hours) at 06/21/2023 1004 Last data filed at 06/21/2023 0500 Gross per 24 hour  Intake 446.53 ml  Output 1100 ml  Net -653.47 ml      Physical Exam    General:   No resp difficulty HEENT: Normal Neck: Supple. JVP 6-7; difficult to assess though Cor: PMI nondisplaced. Tachy Regular rate & rhythm. No rubs, gallops or murmurs. Lungs: CTA Abdomen: Soft, nontender, nondistended. No hepatosplenomegaly. No bruits or masses. Good bowel sounds. Extremities: No edema; chronic changes from venous congestion; chronic wounds Neuro: Alert & orientedx3, cranial nerves grossly intact. moves all 4 extremities w/o difficulty. Affect pleasant   Telemetry   Atrial Flutter 120s-130s  EKG    N/A  Labs    CBC Recent Labs    06/19/23 0327 06/21/23 0442  WBC 5.0 4.7  HGB 15.4 14.8  HCT 47.3 45.3  MCV 93.1 92.3  PLT 254 309   Basic Metabolic Panel Recent Labs    16/10/96 0327 06/20/23 0336 06/21/23 0442  NA 133* 136 135  K 3.6 4.1 4.0  CL 99 100 101  CO2 23 24 24   GLUCOSE 105* 122* 124*  BUN 25* 31* 36*  CREATININE 1.66* 1.85* 1.75*  CALCIUM 8.2* 8.9 8.6*  MG 1.9  --   --    Liver Function Tests No  results for input(s): "AST", "ALT", "ALKPHOS", "BILITOT", "PROT", "ALBUMIN" in the last 72 hours. No results for input(s): "LIPASE", "AMYLASE" in the last 72 hours. Cardiac Enzymes No results for input(s): "CKTOTAL", "CKMB", "CKMBINDEX", "TROPONINI" in the last 72 hours.  BNP: BNP (last 3 results) Recent Labs    07/04/22 0631 12/02/22 1700 06/14/23 0630  BNP 74.8 83.8 835.3*     Other results:   Imaging    No results found.   Medications:     Scheduled Medications:  vitamin C  500 mg Oral BID   Chlorhexidine Gluconate Cloth  6 each Topical Daily   insulin aspart  0-9 Units Subcutaneous TID WC   metoprolol tartrate  25 mg Oral Q6H   multivitamin with minerals  1 tablet Oral Daily   nutrition supplement (JUVEN)  1 packet Oral BID BM   Ensure Max Protein  11 oz Oral BID   sacubitril-valsartan  1 tablet Oral BID   spironolactone  25 mg Oral Daily   warfarin  2 mg Oral ONCE-1600   [START ON 06/22/2023] warfarin  5 mg Oral q1600   Warfarin - Pharmacist Dosing Inpatient   Does not apply q1600    Infusions:  sodium chloride 20 mL/hr at 06/20/23 1335   amiodarone 30 mg/hr (06/21/23 0232)  PRN Medications: mouth rinse    Patient Profile   Ryan [name] is a 52 y.o. 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 Echo 9//2024 EF 60-65%. RV moderately reduced. DVT back in 1994. PE diagnosed back in 2018. Had PE thrombectomy x 2 in 2024.  CTA - large chronic nonocclusive thrombus R Main PA + small chronic nonocclusive  distal left main and small embolus  segmental branch.   -Improvement in sCr today to 1.75 after holding diuretics; plan on TEE today. Will plan on restarting torsemide in 1-2 days.  - Continue spironolactone 25mg  daily - Continue entresto 24/26 - Failed DOAC in the past. On Coumadin with INR goal increased to 2.5-3.  - Will need outpatient RHC, hold for the time being given large clot burden - Sleep study at discharge - Autoimmune workup and hematology workup have been completed prior.  2. Sepsis: Presented with fever, elevated lactate, likely source is nonhealing lower extremity wound.  - Continue antibiotics per primary team  3. PAF-->A Flutter RVR  Previously on 25 mg Toprol XL daily. EKG on admit showed SR. Over the weekend converted to A Flutter RVR. Started on amio drip and 12.5 mg lopressor.   Remained in A fib RVR. Lopressor was increased to 25 mg every 6 hours.  On warfarin. INR 2.7. Of note INR has not been therapeutic over the last 30 days.  - Amiodarone infiltrated into the forearm; can discontinue at this time. Poor efficacy in the setting of atrial flutter.  - Plan on TEE/DCCV this afternoon.   4. AKI on CKD - Creatinine baseline ~ 1.4.  - Improving; sCr now 1.75 from 1.85.  - Continue to hold diuretics.   5. Obesity  - Body mass index is 52.25 kg/m.    Length of Stay: 7  Ryan Kopp, DO  06/21/2023, 10:04 AM  Advanced Heart Failure Team Pager [phone] (M-F; 7a - 5p)  Please contact [facility] for night-coverage after hours (5p -7a ) and weekends on [email or web address]

## 2023-06-21 NOTE — Interval H&P Note (Signed)
History and Physical Interval Note:  06/21/2023 12:51 PM  Ryan [name]  has presented today for surgery, with the diagnosis of TEE and Cardioversion   A-fib OK Megan Anesthesia   Start time 1:30p.  The various methods of treatment have been discussed with the patient and family. After consideration of risks, benefits and other options for treatment, the patient has consented to  Procedure(s): TRANSESOPHAGEAL ECHOCARDIOGRAM (TEE) (N/A) CARDIOVERSION (N/A) as a surgical intervention.  The patient's history has been reviewed, patient examined, no change in status, stable for surgery.  I have reviewed the patient's chart and labs.  Questions were answered to the patient's satisfaction.     Shaquetta Arcos

## 2023-06-22 ENCOUNTER — Encounter: Payer: Self-pay | Admitting: Cardiology

## 2023-06-22 DIAGNOSIS — I2699 Other pulmonary embolism without acute cor pulmonale: Secondary | ICD-10-CM | POA: Diagnosis not present

## 2023-06-22 DIAGNOSIS — R0902 Hypoxemia: Secondary | ICD-10-CM | POA: Diagnosis not present

## 2023-06-22 DIAGNOSIS — R509 Fever, unspecified: Secondary | ICD-10-CM | POA: Diagnosis not present

## 2023-06-22 DIAGNOSIS — J9621 Acute and chronic respiratory failure with hypoxia: Secondary | ICD-10-CM | POA: Diagnosis not present

## 2023-06-22 DIAGNOSIS — L03116 Cellulitis of left lower limb: Secondary | ICD-10-CM | POA: Diagnosis not present

## 2023-06-22 DIAGNOSIS — I483 Typical atrial flutter: Secondary | ICD-10-CM | POA: Diagnosis not present

## 2023-06-22 LAB — BASIC METABOLIC PANEL
Anion gap: 9 (ref 5–15)
BUN: 39 mg/dL — ABNORMAL HIGH (ref 6–20)
CO2: 23 mmol/L (ref 22–32)
Calcium: 8.7 mg/dL — ABNORMAL LOW (ref 8.9–10.3)
Chloride: 102 mmol/L (ref 98–111)
Creatinine, Ser: 1.64 mg/dL — ABNORMAL HIGH (ref 0.61–1.24)
GFR, Estimated: 50 mL/min — ABNORMAL LOW (ref >60)
Glucose, Bld: 105 mg/dL — ABNORMAL HIGH (ref 70–99)
Potassium: 4.2 mmol/L (ref 3.5–5.1)
Sodium: 134 mmol/L — ABNORMAL LOW (ref 135–145)

## 2023-06-22 LAB — CBC
HCT: 43 % (ref 39.0–52.0)
Hemoglobin: 14.2 g/dL (ref 13.0–17.0)
MCH: 30.1 pg (ref 26.0–34.0)
MCHC: 33 g/dL (ref 30.0–36.0)
MCV: 91.1 fL (ref 80.0–100.0)
Platelets: 316 10*3/uL (ref 150–400)
RBC: 4.72 MIL/uL (ref 4.22–5.81)
RDW: 16 % — ABNORMAL HIGH (ref 11.5–15.5)
WBC: 3.9 10*3/uL — ABNORMAL LOW (ref 4.0–10.5)
nRBC: 0 % (ref 0.0–0.2)

## 2023-06-22 LAB — PROTIME-INR
INR: 3.3 — ABNORMAL HIGH (ref 0.8–1.2)
Prothrombin Time: 33.7 s — ABNORMAL HIGH (ref 11.4–15.2)

## 2023-06-22 LAB — GLUCOSE, CAPILLARY
Glucose-Capillary: 111 mg/dL — ABNORMAL HIGH (ref 70–99)
Glucose-Capillary: 123 mg/dL — ABNORMAL HIGH (ref 70–99)

## 2023-06-22 MED ORDER — FUROSEMIDE 40 MG PO TABS
40.0000 mg | ORAL_TABLET | ORAL | Status: DC
  Administered 2023-06-23: 40 mg via ORAL
  Filled 2023-06-22: qty 1

## 2023-06-22 MED ORDER — LOSARTAN POTASSIUM 25 MG PO TABS
25.0000 mg | ORAL_TABLET | Freq: Every day | ORAL | Status: DC
  Administered 2023-06-22 – 2023-06-23 (×2): 25 mg via ORAL
  Filled 2023-06-22 (×2): qty 1

## 2023-06-22 MED ORDER — METOPROLOL SUCCINATE ER 50 MG PO TB24
50.0000 mg | ORAL_TABLET | Freq: Every day | ORAL | Status: DC
  Administered 2023-06-23: 50 mg via ORAL
  Filled 2023-06-22 (×2): qty 1

## 2023-06-22 MED ORDER — LACTULOSE 10 GM/15ML PO SOLN
30.0000 g | Freq: Two times a day (BID) | ORAL | Status: DC | PRN
  Filled 2023-06-22: qty 60

## 2023-06-22 MED ORDER — DIGOXIN 125 MCG PO TABS
0.1250 mg | ORAL_TABLET | Freq: Once | ORAL | Status: AC
  Administered 2023-06-22: 0.125 mg via ORAL
  Filled 2023-06-22: qty 1

## 2023-06-22 MED ORDER — DIGOXIN 125 MCG PO TABS
0.0625 mg | ORAL_TABLET | Freq: Every day | ORAL | Status: DC
  Administered 2023-06-23: 0.0625 mg via ORAL
  Filled 2023-06-22: qty 0.5

## 2023-06-22 NOTE — Plan of Care (Signed)

## 2023-06-22 NOTE — Progress Notes (Addendum)
Nutrition Follow Up Note   DOCUMENTATION CODES:   Morbid obesity  INTERVENTION:   Discontinue Ensure Max protein supplement   Continue Juven Fruit Punch po BID, each serving provides 95kcal and 2.5g of protein (amino acids glutamine and arginine)  MVI po daily  Vitamin C 500mg  po BID  Daily weights   NUTRITION DIAGNOSIS:   Increased nutrient needs related to wound healing as evidenced by estimated needs. -resolving   GOAL:   Patient will meet greater than or equal to 90% of their needs -progressing   MONITOR:   PO intake, Supplement acceptance, Labs, Weight trends, Skin, I & O's  ASSESSMENT:   52 y.o. 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  -Pt s/p TTE & DCCV 10/22  Spoke with pt via phone. Pt reports continued good appetite and oral intake in hospital; pt eating 100% of meals. Pt reports that he is not drinking the Ensure as he reports his appetite is good and his wound is healed and he does not feel he needs it. Pt is continuing to drink the Juven as he reports that he enjoys this and he drinks this at home as well. No BM noted since 10/15; MD aware. Pt reports that he had BM last night. Per chart, pt is down 10lbs since admission. Pt -7.0L on his I & Os.    Medications reviewed and include: vitamin C, cephalexin, lasix, insulin, MVI, juven, aldactone, warfarin   Labs reviewed: Na 134(L), K 4.2 wnl, BUN 39(H), creat 1.64(H) Wbc- 3.9(L) Cbgs- 111, 110, 122 x 48 hrs   Diet Order:   Diet Order             Diet 2 gram sodium Fluid consistency: Thin; Fluid restriction: 1800 mL Fluid  Diet effective now                  EDUCATION NEEDS:   Education needs have been addressed  Skin:  Skin Assessment: Reviewed RN Assessment (VSU)  Last  BM:  10/22- per pt  Height:   Ht Readings from Last 1 Encounters:  06/21/23 5\' 7"  (1.702 m)    Weight:   Wt Readings from Last 1 Encounters:  06/22/23 (!) 151.2 kg    Ideal Body Weight:  67.2 kg  BMI:  Body mass index is 52.21 kg/m.  Estimated Nutritional Needs:   Kcal:  2700-3000kcal/day  Protein:  >135g/day  Fluid:  1.9-2.2L/day  Betsey Holiday MS, RD, LDN Please refer to [facility] for RD and/or RD on-call/weekend/after hours pager

## 2023-06-22 NOTE — Progress Notes (Signed)
Advanced Heart Failure Rounding Note  PCP-Cardiologist: None   Subjective:    - TEE/DCCV yesterday. NSR now.  - Feels better; reports that he can walk longer distances today.    Objective:   Weight Range: (!) 151.2 kg Body mass index is 52.21 kg/m.   Vital Signs:   Temp:  [97.6 F (36.4 C)-98.6 F (37 C)] 98.4 F (36.9 C) (10/23 0726) Pulse Rate:  [74-91] 75 (10/23 0726) Resp:  [16-31] 16 (10/23 0726) BP: (82-112)/(59-83) 112/79 (10/23 0726) SpO2:  [90 %-96 %] 94 % (10/23 0726) Weight:  [151 kg-151.2 kg] 151.2 kg (10/23 0306) Last BM Date : 06/21/23  Weight change: Filed Weights   06/20/23 0500 06/21/23 1206 06/22/23 0306  Weight: (!) 151.3 kg (!) 151 kg (!) 151.2 kg    Intake/Output:   Intake/Output Summary (Last 24 hours) at 06/22/2023 0939 Last data filed at 06/22/2023 0900 Gross per 24 hour  Intake 880 ml  Output 1070 ml  Net -190 ml      Physical Exam    General:   No resp difficulty HEENT: Normal Neck: Supple. JVP does not appear elevated.  Cor: PMI nondisplaced. regular rate & rhythm. No rubs, gallops or murmurs. Lungs: CTA Abdomen: Soft, nontender, nondistended. No hepatosplenomegaly. No bruits or masses. Good bowel sounds. Extremities: No edema; chronic changes from venous congestion; chronic wounds Neuro: Alert & orientedx3, cranial nerves grossly intact. moves all 4 extremities w/o difficulty. Affect pleasant   Telemetry   NSR in the 80s  EKG    N/A  Labs    CBC Recent Labs    06/21/23 0442 06/22/23 0354  WBC 4.7 3.9*  HGB 14.8 14.2  HCT 45.3 43.0  MCV 92.3 91.1  PLT 309 316   Basic Metabolic Panel Recent Labs    96/04/54 0442 06/22/23 0354  NA 135 134*  K 4.0 4.2  CL 101 102  CO2 24 23  GLUCOSE 124* 105*  BUN 36* 39*  CREATININE 1.75* 1.64*  CALCIUM 8.6* 8.7*   Liver Function Tests No results for input(s): "AST", "ALT", "ALKPHOS", "BILITOT", "PROT", "ALBUMIN" in the last 72 hours. No results for input(s):  "LIPASE", "AMYLASE" in the last 72 hours. Cardiac Enzymes No results for input(s): "CKTOTAL", "CKMB", "CKMBINDEX", "TROPONINI" in the last 72 hours.  BNP: BNP (last 3 results) Recent Labs    07/04/22 0631 12/02/22 1700 06/14/23 0630  BNP 74.8 83.8 835.3*     Other results:   Imaging    No results found.   Medications:     Scheduled Medications:  vitamin C  500 mg Oral BID   cephALEXin  500 mg Oral Q8H   Chlorhexidine Gluconate Cloth  6 each Topical Daily   [START ON 06/23/2023] digoxin  0.0625 mg Oral Daily   digoxin  0.125 mg Oral Once   [START ON 06/23/2023] furosemide  40 mg Oral QODAY   insulin aspart  0-9 Units Subcutaneous TID WC   losartan  25 mg Oral Daily   metoprolol succinate  50 mg Oral Daily   multivitamin with minerals  1 tablet Oral Daily   neomycin-bacitracin-polymyxin  1 Application Apply externally TID   nutrition supplement (JUVEN)  1 packet Oral BID BM   Ensure Max Protein  11 oz Oral BID   spironolactone  25 mg Oral Daily   warfarin  5 mg Oral q1600   Warfarin - Pharmacist Dosing Inpatient   Does not apply q1600    Infusions:    PRN  Medications: mouth rinse    Patient Profile   Ryan [name] is a 52 y.o. 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 Echo 9//2024 EF 60-65%. RV moderately reduced. DVT back in 1994. PE diagnosed back in 2018. Had PE thrombectomy x 2 in 2024.  CTA - large chronic nonocclusive thrombus R Main PA + small chronic nonocclusive distal left main and small  embolus  segmental branch.   - Continue spironolactone 25mg  daily - Failed DOAC in the past. On Coumadin with INR goal increased to 2.5-3.  - Will need outpatient RHC, hold for the time being given large clot burden - Sleep study at discharge - Autoimmune workup and hematology workup have been completed prior. - D/C Entresto; start losartan 25mg  daily. Will target slightly higher MAPs in the setting of severe RV dysfunction noted on TEE yesterday.  - Start digoxin 0.0643mcg daily.  - Lasix 40mg  PO every other day.  - D/C toprol 25mg  q6h; start succinate 50mg  daily.  - stable for discharge from HF standpoint.   2. Sepsis: Presented with fever, elevated lactate, likely source is nonhealing lower extremity wound.  - Resolved. Completed 5 day course of rocephin/flagyl  3. PAF-->A Flutter RVR  Previously on 25 mg Toprol XL daily. EKG on admit showed SR. Over the weekend converted to A Flutter RVR. Started on amio drip and 12.5 mg lopressor.   Remained in A fib RVR.  On warfarin. INR 2.7. Of note INR has not been therapeutic over the last 30 days.  - S/P TEE/DCCV yesterday. In NSR now.  - Metoprolol succinate 50mg  daily - EP referral for ablation.   4. AKI on CKD - Creatinine baseline ~ 1.4.  - continuing to improve - start lasix 40mg  PO every other day tomorrow.   5. Obesity  - Body mass index is 52.21 kg/m.  Stable for D/C form HF standpoint. We will arrange close outpatient follow up.  HF RECS AT DC:  Losartan 25mg  daily Digoxin 0.0640mcg daily Metoprolol succinate 50mg  daily Spironolactone 25mg  daily Lasix 40mg  every other day Continue anticoagulation for AFL EP referral for AFL ablation candidacy   Length of Stay: 8  Ryan Stockburger, DO  06/22/2023, 9:39 AM  Advanced Heart Failure Team Pager [phone] (M-F; 7a - 5p)  Please contact [facility] for night-coverage after hours (5p -7a ) and weekends on [email or web address]

## 2023-06-22 NOTE — Consult Note (Signed)
Pharmacy Consult Note - Anticoagulation  Pharmacy Consult for Warfarin Indication: pulmonary embolus  PATIENT MEASUREMENTS: Height: 5\' 7"  (170.2 cm) Weight: (!) 151.2 kg (333 lb 5.4 oz) IBW/kg (Calculated) : 66.1 HEPARIN DW (KG): 104.5  Recent Labs    06/22/23 0354  HGB 14.2  HCT 43.0  PLT 316  LABPROT 33.7*  INR 3.3*  CREATININE 1.64*    Estimated Creatinine Clearance: 73.8 mL/min (A) (by C-G formula based on SCr of 1.64 mg/dL (H)).  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  Regular the workup has not been forthcoming) previously followed by Dr. Isaiah Serge at [facility]   Hepatic steatosis    Hypertension    Morbid obesity with BMI of 50.0-59.9, adult (HCC)    Oxygen deficiency    Peripheral vascular disease (HCC)    Presence of IVC filter    Has 2 IVC filters with significant thrombus burden superior to the filter.   Pulmonary embolus (HCC) 12/2016   Initially just treated with anticoagulation, but in February 2023 in April 2024 treated with EKOS thrombectomy for submassive PE.   Ventricular trigeminy    Weakness of both lower limbs     ASSESSMENT: 52 y.o. 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 CHADSVASc 4.   Patient is not a good candidate for DOAC due to CTEPH and failed therapies in the past. Will aim for an INR goal of 2.5 to 3 due to recurrent VTEs. Patient has a large body  mass which may cause the need for higher warfarin doses. Patient was subtherapeutic at admission after running out of mediation at home.    DDI metronidazole and ceftriaxone - completed 10/20, now on Keflex until 10/25 AM DDI with amiodarone 10/19 - stopped 10/22  Pertinent medications: PTA Warfarin 5 mg daily  Goal(s) of therapy: INR 2.5 - 3   Baseline anticoagulation labs: Recent Labs    06/20/23 0336 06/21/23 0442 06/22/23 0354  INR 2.7* 3.3* 3.3*  HGB  --  14.8 14.2  PLT  --  309 316   Heparin Plan: Heparin infusion stopped 10/20  Date INR Comment/Dose 10/15 1.6 Subtherapeutic  10/16 --- No INR, Warfarin 7.5 mg 10/17 1.8 Subtherapeutic, 10 mg 10/18  2.0 Subtherapeutic 10 mg 10/19  2.2 Subtherapeutic, 10 mg 10/20  2.8 Therapeutic, 8 mg 10/21   2.7 Therapeutic, 7.5 mg 10/22  3.3  SUPRA-therapeutic, 2 mg 10/23  3.3  SUPRA-therapeutic, 5 mg  Plan: **Subtherapeutic on admission due to compliance issues. Per conversation with patient, INR changes drastically with dose changes but with delayed reaction (caution when boosting dose) INR slightly above goal today and un-changed from yesterday after 2 mg dose. Patient has averaged ~ 8 mg daily over the last week but difficult to interpret given DDIs. Resume 5 mg daily on 10/23. Monitor dosing response now that amiodarone has been stopped. New DDI with metronidazole completed 10/20, amiodarone started 10/19 and stopped 10/22 and HF  exacerbation (fluid overload) also influencing INR Continue daily INR while inpatient CBC at least every 3 days; stable and no s/sx of bleeding noted  Tressie Ellis 06/22/2023 7:26 AM

## 2023-06-22 NOTE — Progress Notes (Signed)
Progress Note   Patient: Ryan [name] AOZ:[id number] DOB: Jul 28, 1971 DOA: 06/14/2023     8 DOS: the patient was seen and examined on 06/22/2023     Subjective / Brief ROS:  Patient seen and examined at bedside this morning Denies any acute complaints at this time INR still supratherapeutic      Hospital course / significant events:  HPI: Ryan [name] is a 52 y.o. 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 Presented to ED 06/14/23 w/ respiratory distress, SOB. Of note, recent admission requiring mechanical thrombectomy 05/17/23, chronically on coumadin. No chest pain. Had mild cough, nausea/vomiting, diarrhea over few hours prior to arrival  10/15: to ED, CTA chest (+)1 acute to subacute new PE in LUL, remainder of PE is chronic, chronic RV strain. Subtherapeutic INR 1.6. Requiring 4L O2 [place]. (+)chronic wound LLE w/ a foul odor, abx initiated. Concern for acute CHF. Vascular surgery consulted - continue heparin but no procedures planned for now, recs for diuresis and reevaluate. Hematology consulted - continue heparin bridging to coumadin w/ INR goal 2.5-3.  10/16: still tachypneic, on 4L O2. Cardiology following, increased diuresis. pharmacy following for coumadin bridging to goal INR .  10/17-10/18: BCx NG.  INR still subtherapeutic, continue heparin. Weaning off O2.  10/19: AFlutter RVR requiring amiodarone gtt, restarted beta blocker  10/20: remains Aflutter/rapid but improved, staying on amio gtt for today 10/21 continue amio, cardiology planning TEE/DCCV tomorrow  10/22: TEE/DCCV w/ conversion to sinus rhythm   Consultants:  Vascular Surgery Hematology/Oncology  Cardiology     Procedures/Surgeries: 06/21/23 TEE/DCCV w/ conversion to sinus rhythm            ASSESSMENT & PLAN:   Acute on chronic respiratory failure with hypoxia Ddx: CHF exacerbation / R heart strain, acute on chronic PE but this is fairly mild, sepsis d/t LE wound, restrictive lung disease d/t habitus  Treating underlying causes as below  Monitor oxygen requirement   Hx paroxysmal Afib Supratherapeutic INR Aflutter w/ RVR morning 06/18/23  Given IV labetalol with spontaneous conversion to sinus rhythm briefly but later on reverted to atrial flutter.  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 2D echo 05/20/2023 with a EF of 60 to 65% torsemide 40 mg daily - held d/t AKI can hopefully restart in 1-2 days Continue spironolactone 25 mg daily  Continue Entresto 24/26  Continue Lopressor 12.5 mg every 6 hours  Cardiology following Will need outpatient R heart cath  Will need outpatient sleep study  Strict ins and outs and daily weights   SIRS d/t PE/CHF, questions severe sepsis d/t acute on chronic lower extremity wound with new ulcer formation and foul-smelling odor Sepsis ruled out clinically, suspect SIRS d/t PE/CHF  Meeting SIRS vs severe sepsis criteria with temperature of 101, respirations in the upper 20s, lactate noted  be greater than 2 Plain film imaging of the left lower extremity - no concerns, (+)edema  ESR and sed rate --> no concerns  Rocephin, Flagyl - d/c (completion 5 days)  Panculture - NG thus far  Wound care consult - follow dressing instructions:  Clean L lower medial leg ulcer with NS, apply silver hydrofiber ([employer] [place] [phone]) cut to fit wound bed every other day, cover with dry gauze or ABD pad. Wrap leg with Kerlix roll gauze beginning just above toes and ending right below knee. Secure with Coban wrapped in same fashion as Kerlix for some compression. Moisten silver w/ NS to facilitate removal     Sepsis d/t cellulitis, PA, Completed abx Now w/ small area of questionable developing cellulitis vs peripheral vascular ulceration / stasis dermatitis - see photo Warm compresses Continue bacitracin topical  Keflex   AKI, mild  On CKD (chronic kidney disease), stage IIIa Likely related to diuresis Holding diuretics yesterday --> improved Cr  Continue renal function monitoring   Type 2 diabetes mellitus with diabetic chronic kidney disease (HCC) SSI A1c   Essential hypertension BP stable Monitor and meds as above    Elevated troponin Troponin 40s to 50s Suspect secondary mild demand ischemia in the setting of acute on chronic PE, acute on chronic HFpEF as well as sepsis Repeat if chest pain    Dizziness - resolved No chest pain/SOB, no neuro deficits  Meclizine prn Further w/u if persists      Morbid obesity based on BMI: Body mass index is 52.14 kg/m.  Complicates clinical prognosis    DVT prophylaxis: heparin/coumadin IV fluids: no continuous IV fluids  Nutrition: carb/cardiac Central lines / invasive devices: none   Code Status: FULL CODE ACP documentation reviewed: 06/21/23 and none on file in [facility]   TOC needs: TBD, expect may need to discharge on continuous O2 rather than hs  Barriers to dispo / significant pending items: monitoring following DCCV today, clear w/ cardiology prior to dc        Family Communication: none at this time     Physical Exam Constitutional:      General: He is not in acute distress.    Appearance: He is obese.  Cardiovascular:     Rate and Rhythm: Regular rhythm.  Tachycardia present.  Pulmonary:     Effort: Pulmonary effort is normal.     Breath sounds: Normal breath sounds.  Abdominal:     Tenderness: There is no abdominal tenderness.  Musculoskeletal:     Right lower leg: Edema present.     Left lower leg: Edema present.  Skin:    General: Skin is dry.     Findings: Lesion (see photo, tender mild erythematous area on lateral LLE) present.  Neurological:     General: No focal deficit present.     Mental Status: He is alert and oriented to person, place, and time.  Psychiatric:        Mood and Affect: Mood normal.        Behavior: Behavior normal.      Vitals:   06/22/23 1119 06/22/23 1120 06/22/23 1217 06/22/23 1637  BP: 99/61   103/67  Pulse:   79 76  Resp: 20 16  (!) 22  Temp:   98.4 F (36.9 C) 98.1 F (36.7 C)  TempSrc:   Oral Oral  SpO2:   97% 94%  Weight:      Height:          Latest  Ref Rng & Units 06/22/2023    3:54 AM 06/21/2023    4:42 AM 06/19/2023    3:27 AM  CBC  WBC 4.0 - 10.5 K/uL 3.9  4.7  5.0   Hemoglobin 13.0 - 17.0 g/dL 14.7  82.9  56.2   Hematocrit 39.0 - 52.0 % 43.0  45.3  47.3   Platelets 150 - 400 K/uL 316  309  254        Latest Ref Rng & Units 06/22/2023    3:54 AM 06/21/2023    4:42 AM 06/20/2023    3:36 AM  BMP  Glucose 70 - 99 mg/dL 130  865  784   BUN 6 - 20 mg/dL 39  36  31   Creatinine 0.61 - 1.24 mg/dL 6.96  2.95  2.84   Sodium 135 - 145 mmol/L 134  135  136   Potassium 3.5 - 5.1 mmol/L 4.2  4.0  4.1   Chloride 98 - 111 mmol/L 102  101  100   CO2 22 - 32 mmol/L 23  24  24    Calcium 8.9 - 10.3 mg/dL 8.7  8.6  8.9     Data Reviewed: I have reviewed previous provider documentation, I have also reviewed cardiology documentation, nursing documentation as well as above-mentioned labs and vitals    Time spent: 45 minutes  Author: Loyce Dys, MD 06/22/2023 5:44 PM  For on call review [email or web address].

## 2023-06-23 ENCOUNTER — Telehealth: Payer: Self-pay

## 2023-06-23 DIAGNOSIS — I2699 Other pulmonary embolism without acute cor pulmonale: Secondary | ICD-10-CM

## 2023-06-23 DIAGNOSIS — L03116 Cellulitis of left lower limb: Secondary | ICD-10-CM | POA: Diagnosis not present

## 2023-06-23 DIAGNOSIS — R509 Fever, unspecified: Secondary | ICD-10-CM | POA: Diagnosis not present

## 2023-06-23 DIAGNOSIS — I4891 Unspecified atrial fibrillation: Secondary | ICD-10-CM

## 2023-06-23 DIAGNOSIS — R0902 Hypoxemia: Secondary | ICD-10-CM | POA: Diagnosis not present

## 2023-06-23 LAB — GLUCOSE, CAPILLARY
Glucose-Capillary: 119 mg/dL — ABNORMAL HIGH (ref 70–99)
Glucose-Capillary: 110 mg/dL — ABNORMAL HIGH (ref 70–99)
Glucose-Capillary: 130 mg/dL — ABNORMAL HIGH (ref 70–99)

## 2023-06-23 LAB — BASIC METABOLIC PANEL
Anion gap: 7 (ref 5–15)
BUN: 36 mg/dL — ABNORMAL HIGH (ref 6–20)
CO2: 26 mmol/L (ref 22–32)
Calcium: 8.7 mg/dL — ABNORMAL LOW (ref 8.9–10.3)
Chloride: 102 mmol/L (ref 98–111)
Creatinine, Ser: 1.51 mg/dL — ABNORMAL HIGH (ref 0.61–1.24)
GFR, Estimated: 55 mL/min — ABNORMAL LOW (ref >60)
Glucose, Bld: 106 mg/dL — ABNORMAL HIGH (ref 70–99)
Potassium: 4.3 mmol/L (ref 3.5–5.1)
Sodium: 135 mmol/L (ref 135–145)

## 2023-06-23 LAB — PROTIME-INR
INR: 2.8 — ABNORMAL HIGH (ref 0.8–1.2)
Prothrombin Time: 29.9 s — ABNORMAL HIGH (ref 11.4–15.2)

## 2023-06-23 MED ORDER — DIGOXIN 62.5 MCG PO TABS: 0.0625 mg | ORAL_TABLET | Freq: Every day | ORAL | 0 refills | Status: DC

## 2023-06-23 MED ORDER — METOPROLOL SUCCINATE ER 50 MG PO TB24: 50.0000 mg | ORAL_TABLET | Freq: Every day | ORAL | 0 refills | Status: DC

## 2023-06-23 MED ORDER — TRIPLE ANTIBIOTIC 3.5-400-5000 EX OINT: 1.0000 | TOPICAL_OINTMENT | Freq: Three times a day (TID) | CUTANEOUS | 0 refills | Status: DC

## 2023-06-23 MED ORDER — SPIRONOLACTONE 25 MG PO TABS: 25.0000 mg | ORAL_TABLET | Freq: Every day | ORAL | 0 refills | Status: DC

## 2023-06-23 MED ORDER — CEPHALEXIN 500 MG PO CAPS: 500.0000 mg | ORAL_CAPSULE | Freq: Three times a day (TID) | ORAL | 0 refills | Status: AC

## 2023-06-23 MED ORDER — ADULT MULTIVITAMIN W/MINERALS CH: 1.0000 | ORAL_TABLET | Freq: Every day | ORAL | 0 refills | Status: AC

## 2023-06-23 MED ORDER — ASCORBIC ACID 500 MG PO TABS: 500.0000 mg | ORAL_TABLET | Freq: Two times a day (BID) | ORAL | 0 refills | Status: DC

## 2023-06-23 MED ORDER — LOSARTAN POTASSIUM 25 MG PO TABS: 25.0000 mg | ORAL_TABLET | Freq: Every day | ORAL | 0 refills | Status: DC

## 2023-06-23 NOTE — TOC Transition Note (Signed)
Transition of Care [facility]) - CM/SW Discharge Note   Patient Details  Name: Ryan [name] MRN: [id number] Date of Birth: 1971-08-05  [facility] CM/SW Contact:  Darolyn Rua, LCSW Phone Number: 06/23/2023, 12:00 PM   Clinical Narrative:     Patient to discharge home today per MD will need home o2, ordered through [employer], sat note and orders are in. Morrie Sheldon with [name] to deliver to hospital        Patient Goals and CMS Choice      Discharge Placement                         Discharge Plan and Services Additional resources added to the After Visit Summary for                                       Social Determinants of Health (SDOH) Interventions SDOH Screenings   Food Insecurity: No Food Insecurity (06/14/2023)  Housing: Low Risk  (06/14/2023)  Transportation Needs: No Transportation Needs (06/14/2023)  Utilities: Not At Risk (06/14/2023)  Depression (PHQ2-9): Low Risk  (06/02/2023)  Financial Resource Strain: Low Risk  (12/30/2022)  Physical Activity: Insufficiently Active (12/30/2022)  Social Connections: Unknown (12/30/2022)  Stress: No Stress Concern Present (12/30/2022)  Tobacco Use: Low Risk  (06/14/2023)     Readmission Risk Interventions     No data to display

## 2023-06-23 NOTE — Progress Notes (Signed)
SATURATION QUALIFICATIONS: (This note is used to comply with regulatory documentation for home oxygen)  Patient Saturations on Room Air at Rest = 91  Patient Saturations on Room Air while Ambulating = 88%  Patient Saturations on 2 Liters of oxygen while Ambulating = 92  Please briefly explain why patient needs home oxygen:

## 2023-06-23 NOTE — Discharge Summary (Signed)
Physician Discharge Summary   Patient: Ryan [name] MRN: [id number] DOB: [birth year]  Admit date:     06/14/2023  Discharge date: 06/23/23  Discharge Physician: Loyce Dys   PCP: [name], NP   Recommendations at discharge:  Follow-up with cardiology Discharge Diagnoses: Acute on chronic respiratory failure with hypoxia Ddx: CHF exacerbation / R heart strain, acute on chronic PE but this is fairly mild, sepsis d/t LE wound, restrictive lung disease d/t habitus  Hx paroxysmal Afib Supratherapeutic INR Aflutter w/ RVR morning 06/18/23  Acute on Chronic Pulmonary emboli  Hx recurrent PE and DVT, failed Xarelto and Eliquis, hypercoag w/u neg  Status post IVC x 2 On Coumadin, w/ Subtherapeutic INR, question adherence  INR goal 2.5-3 Acute on chronic HFrEF (heart failure with reduced ejection fraction)  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 Presented to ED 06/14/23 w/ respiratory distress, SOB. Of note, recent admission requiring mechanical thrombectomy 05/17/23, chronically on coumadin. No chest pain. Had  mild cough, nausea/vomiting, diarrhea over few hours prior to arrival  10/15: to ED, CTA chest (+)1 acute to subacute new PE in LUL, remainder of PE is chronic, chronic RV strain. Subtherapeutic INR 1.6. Requiring 4L O2 [place]. (+)chronic wound LLE w/ a foul odor, abx initiated. Concern for acute CHF. Vascular surgery consulted - continue heparin but no procedures planned for now, recs for diuresis and reevaluate. Hematology consulted - continue heparin bridging to coumadin w/ INR goal 2.5-3.  10/16: still tachypneic, on 4L O2. Cardiology following, increased diuresis. pharmacy following for coumadin bridging to goal INR .  10/17-10/18: BCx NG.  INR still subtherapeutic, continue heparin. Weaning off O2.  10/19: AFlutter RVR requiring amiodarone gtt, restarted beta blocker  10/20: remains Aflutter/rapid but improved, staying on amio gtt for today 10/21 continue amio, cardiology planning TEE/DCCV tomorrow  10/22: TEE/DCCV w/ conversion to sinus rhythm  Patient has now been cleared by cardiologist for discharge today and to follow-up as an outpatient  Consultants: Cardiology, wound care Procedures performed: As mentioned above Disposition: Home Diet recommendation:  Cardiac diet DISCHARGE MEDICATION: Allergies as of 06/23/2023       Reactions   Metrizamide Other (See Comments)   Kidney failure requiring dialysis Kidney failure requiring dialysis   Clindamycin/lincomycin Rash   Lincomycin Rash   Sulfa Antibiotics Rash        Medication List     STOP taking these medications    sacubitril-valsartan 24-26 MG Commonly known as: ENTRESTO   tirzepatide 5 MG/0.5ML Pen Commonly known as: MOUNJARO       TAKE these medications    albuterol 108 (90 Base) MCG/ACT inhaler Commonly known as: VENTOLIN HFA Inhale 2 puffs into the lungs every 6 (six) hours as  needed for wheezing or shortness of breath.   ascorbic acid 500 MG tablet Commonly known as: VITAMIN C Take 1 tablet (500 mg  total) by mouth 2 (two) times daily.   cephALEXin 500 MG capsule Commonly known as: KEFLEX Take 1 capsule (500 mg total) by mouth every 8 (eight) hours for 3 days.   Digoxin 62.5 MCG Tabs Take 0.0625 mg by mouth daily. Start taking on: June 24, 2023   furosemide 40 MG tablet Commonly known as: LASIX Take 1 tablet (40 mg total) by mouth every other day.   losartan 25 MG tablet Commonly known as: COZAAR Take 1 tablet (25 mg total) by mouth daily. Start taking on: June 24, 2023   metoprolol succinate 50 MG 24 hr tablet Commonly known as: TOPROL-XL Take 1 tablet (50 mg total) by mouth daily. Take with or immediately following a meal. Start taking on: June 24, 2023 What changed:  medication strength how much to take additional instructions   multivitamin with minerals Tabs tablet Take 1 tablet by mouth daily. Start taking on: June 24, 2023   neomycin-bacitracin-polymyxin [id number] Oint Apply 1 Application topically 3 (three) times daily.   OXYGEN Inhale 1 L into the lungs daily.   spironolactone 25 MG tablet Commonly known as: ALDACTONE Take 1 tablet (25 mg total) by mouth daily. Start taking on: June 24, 2023   warfarin 5 MG tablet Commonly known as: COUMADIN Take 1 tablet (5 mg total) by mouth daily.        Discharge Exam: Filed Weights   06/21/23 1206 06/22/23 0306 06/23/23 0500  Weight: (!) 151 kg (!) 151.2 kg (!) 151.3 kg   General: He is not in acute distress.    Appearance: He is obese.  Cardiovascular:     Rate and Rhythm: Regular rhythm.  Pulmonary:     Effort: Pulmonary effort is normal.     Breath sounds: Normal breath sounds.  Abdominal:     Tenderness: There is no abdominal tenderness.  Musculoskeletal:     Right lower leg: Edema present.     Left lower leg: Edema present.     General: No focal deficit present.     Mental Status: He is alert and oriented to person, place, and time.  Psychiatric:        Mood and Affect:  Mood normal.        Behavior: Behavior normal.   Condition at discharge: good  The results of significant diagnostics from this hospitalization (including imaging, microbiology, ancillary and laboratory) are listed below for reference.   Imaging Studies: DG Foot 2 Views Left  Result Date: 06/14/2023 CLINICAL DATA:  Cellulitis of the left foot. EXAM: LEFT FOOT - 2 VIEW COMPARISON:  None available. FINDINGS: There is no acute fracture or dislocation. The bones are osteopenic. No bone erosion or periosteal elevation. There is diffuse subcutaneous edema. No radiopaque foreign object or soft tissue gas. IMPRESSION: 1. No acute fracture or dislocation. 2. Diffuse subcutaneous edema. Electronically Signed   By: Elgie Collard M.D.   On: 06/14/2023 17:45   CT Angio Chest PE W and/or Wo Contrast  Result Date: 06/14/2023 CLINICAL DATA:  Shortness of breath. History of chronic pulmonary emboli. EXAM: CT ANGIOGRAPHY CHEST WITH CONTRAST TECHNIQUE: Multidetector CT imaging of the chest was performed using the standard protocol during bolus administration of intravenous contrast. Multiplanar CT image reconstructions and MIPs were obtained to evaluate the vascular anatomy. RADIATION DOSE REDUCTION: This exam was performed according to the departmental dose-optimization  program which includes automated exposure control, adjustment of the mA and/or kV according to patient size and/or use of iterative reconstruction technique. CONTRAST:  75mL OMNIPAQUE IOHEXOL 350 MG/ML SOLN COMPARISON:  05/16/2023 FINDINGS: Cardiovascular: Large chronic nonocclusive thrombus in the right main pulmonary artery. Small amount of nonocclusive thrombus extends into the lobar branches of the right upper and right lower lobes. Chronic peripheral nonocclusive thrombus in the distal left main pulmonary artery extending into the left lower lobe lobar branch pulmonary artery. Small thrombus involving a segmental branch artery within the left  upper lobe anteriorly is new from prior (series 5, image 93). Similar findings of chronic right heart strain with elevated RV to LV ratio of approximately 1.9. Persistently dilated pulmonary trunk measuring 3.7 cm. Heart size is normal. No pericardial effusion. Thoracic aorta is nonaneurysmal. Mediastinum/Nodes: No enlarged mediastinal, hilar, or axillary lymph nodes. Thyroid gland, trachea, and esophagus demonstrate no significant findings. Lungs/Pleura: No focal airspace consolidation. No evidence of pulmonary infarction. No pleural effusion or pneumothorax. Upper Abdomen: Reflux of contrast into the IVC and hepatic veins. IVC filter is partially imaged. No acute abnormality. Musculoskeletal: Numerous dilated venous collaterals along the anterior chest wall are again noted. No new or acute bony abnormality. Review of the MIP images confirms the above findings. IMPRESSION: 1. Examination is positive for acute on chronic PE. Large chronic nonocclusive thrombus in the right main pulmonary artery. Small chronic nonocclusive thrombus in the distal left main pulmonary artery. New small pulmonary embolus involving a segmental branch artery within the left upper lobe. Similar findings of chronic right heart strain with elevated RV to LV ratio of approximately 1.9. 2. Lungs are clear.  No evidence of pulmonary infarction. These results were called by telephone at the time of interpretation on 06/14/2023 at 8:52 am to provider Dr. Lenard Lance, who verbally acknowledged these results. Electronically Signed   By: Duanne Guess D.O.   On: 06/14/2023 08:53   DG Chest Portable 1 View  Result Date: 06/14/2023 CLINICAL DATA:  Shortness of breath.  Respiratory distress EXAM: PORTABLE CHEST 1 VIEW COMPARISON:  05/16/2023 FINDINGS: Low volume chest. Central vascular congestion. Borderline heart size accentuated by technical factors. There is no edema, consolidation, effusion, or pneumothorax. Artifact from EKG leads.  IMPRESSION: Low volume chest with congested appearance of central vessels. Electronically Signed   By: Tiburcio Pea M.D.   On: 06/14/2023 07:21    Microbiology: Results for orders placed or performed during the hospital encounter of 06/14/23  Resp panel by RT-PCR (RSV, Flu A&B, Covid) Anterior Nasal Swab     Status: None   Collection Time: 06/14/23  6:32 AM   Specimen: Anterior Nasal Swab  Result Value Ref Range Status   SARS Coronavirus 2 by RT PCR NEGATIVE NEGATIVE Final    Comment: (NOTE) SARS-CoV-2 target nucleic acids are NOT DETECTED.  The SARS-CoV-2 RNA is generally detectable in upper respiratory specimens during the acute phase of infection. The lowest concentration of SARS-CoV-2 viral copies this assay can detect is 138 copies/mL. A negative result does not preclude SARS-Cov-2 infection and should not be used as the sole basis for treatment or other patient management decisions. A negative result may occur with  improper specimen collection/handling, submission of specimen other than nasopharyngeal swab, presence of viral mutation(s) within the areas targeted by this assay, and inadequate number of viral copies(<138 copies/mL). A negative result must be combined with clinical observations, patient history, and epidemiological information. The expected result is Negative.  Fact Sheet for Patients:  BloggerCourse.com  Fact Sheet for Healthcare Providers:  SeriousBroker.it  This test is no t yet approved or cleared by the [place] FDA and  has been authorized for detection and/or diagnosis of SARS-CoV-2 by FDA under an Emergency Use Authorization (EUA). This EUA will remain  in effect (meaning this test can be used) for the duration of the COVID-19 declaration under Section 564(b)(1) of the Act, 21 U.S.C.section 360bbb-3(b)(1), unless the authorization is terminated  or revoked sooner.       Influenza A by PCR  NEGATIVE NEGATIVE Final   Influenza B by PCR NEGATIVE NEGATIVE Final    Comment: (NOTE) The Xpert Xpress SARS-CoV-2/FLU/RSV plus assay is intended as an aid in the diagnosis of influenza from Nasopharyngeal swab specimens and should not be used as a sole basis for treatment. Nasal washings and aspirates are unacceptable for Xpert Xpress SARS-CoV-2/FLU/RSV testing.  Fact Sheet for Patients: BloggerCourse.com  Fact Sheet for Healthcare Providers: SeriousBroker.it  This test is not yet approved or cleared by the [place] FDA and has been authorized for detection and/or diagnosis of SARS-CoV-2 by FDA under an Emergency Use Authorization (EUA). This EUA will remain in effect (meaning this test can be used) for the duration of the COVID-19 declaration under Section 564(b)(1) of the Act, 21 U.S.C. section 360bbb-3(b)(1), unless the authorization is terminated or revoked.     Resp Syncytial Virus by PCR NEGATIVE NEGATIVE Final    Comment: (NOTE) Fact Sheet for Patients: BloggerCourse.com  Fact Sheet for Healthcare Providers: SeriousBroker.it  This test is not yet approved or cleared by the [place] FDA and has been authorized for detection and/or diagnosis of SARS-CoV-2 by FDA under an Emergency Use Authorization (EUA). This EUA will remain in effect (meaning this test can be used) for the duration of the COVID-19 declaration under Section 564(b)(1) of the Act, 21 U.S.C. section 360bbb-3(b)(1), unless the authorization is terminated or revoked.  Performed at [facility], [address] [place], [place] [address]   Blood Culture (routine x 2)     Status: None   Collection Time: 06/14/23  8:27 AM   Specimen: BLOOD  Result Value Ref Range Status   Specimen Description BLOOD RIGHT HAND  Final   Special Requests   Final    BOTTLES DRAWN AEROBIC AND ANAEROBIC  Blood Culture adequate volume   Culture   Final    NO GROWTH 5 DAYS Performed at [facility], [address], [place], [place] [address]    Report Status 06/19/2023 FINAL  Final  Blood Culture (routine x 2)     Status: None   Collection Time: 06/14/23  8:27 AM   Specimen: BLOOD  Result Value Ref Range Status   Specimen Description BLOOD RIGHT ARM  Final   Special Requests   Final    BOTTLES DRAWN AEROBIC AND ANAEROBIC Blood Culture adequate volume   Culture   Final    NO GROWTH 5 DAYS Performed at [facility], [address] [place], [place] [address]    Report Status 06/19/2023 FINAL  Final  MRSA Next Gen by PCR, Nasal     Status: None   Collection Time: 06/14/23 10:45 AM   Specimen: Nasal Mucosa; Nasal Swab  Result Value Ref Range Status   MRSA by PCR Next Gen NOT DETECTED NOT DETECTED Final    Comment: (NOTE) The GeneXpert MRSA Assay (FDA approved for NASAL specimens only), is one component of a comprehensive MRSA colonization surveillance program. It is  not intended to diagnose MRSA infection nor to guide or monitor treatment for MRSA infections. Test performance is not FDA approved in patients less than 20 years old. Performed at [facility], [address] [place], [place] [address]     Labs: CBC: Recent Labs  Lab 06/17/23 0132 06/18/23 0326 06/19/23 0327 06/21/23 0442 06/22/23 0354  WBC 4.8 4.6 5.0 4.7 3.9*  HGB 13.6 13.8 15.4 14.8 14.2  HCT 40.9 42.7 47.3 45.3 43.0  MCV 93.0 93.0 93.1 92.3 91.1  PLT 207 224 254 309 316   Basic Metabolic Panel: Recent Labs  Lab 06/18/23 0326 06/19/23 0327 06/20/23 0336 06/21/23 0442 06/22/23 0354 06/23/23 0440  NA 136 133* 136 135 134* 135  K 3.3* 3.6 4.1 4.0 4.2 4.3  CL 100 99 100 101 102 102  CO2 25 23 24 24 23 26   GLUCOSE 111* 105* 122* 124* 105* 106*  BUN 22* 25* 31* 36* 39* 36*  CREATININE 1.30* 1.66* 1.85* 1.75* 1.64* 1.51*  CALCIUM 8.2* 8.2* 8.9 8.6* 8.7* 8.7*  MG 1.9  1.9  --   --   --   --    Liver Function Tests: No results for input(s): "AST", "ALT", "ALKPHOS", "BILITOT", "PROT", "ALBUMIN" in the last 168 hours. CBG: Recent Labs  Lab 06/22/23 0727 06/22/23 1218 06/22/23 1637 06/22/23 2119 06/23/23 0828  GLUCAP 111* 123* 119* 110* 130*    Discharge time spent:  .  Signed: Loyce Dys, MD [facility] 06/23/2023

## 2023-06-23 NOTE — TOC Progression Note (Signed)
Transition of Care [facility]) - Progression Note    Patient Details  Name: Ryan [name] MRN: [id number] Date of Birth: Jul 20, 1971  Transition of [facility]) CM/SW Contact  Truddie Hidden, RN Phone Number: 06/23/2023, 9:31 AM  Clinical Narrative:    Spoke with patient at bedside regarding discharge plans. He receives oxygen via [employer]. He was advised he may needs continuous oxygen at discharge. He would like a portable if continuous oxygen is required. He wife or daughter will transport him home at discharge.         Expected Discharge Plan and Services                                               Social Determinants of Health (SDOH) Interventions SDOH Screenings   Food Insecurity: No Food Insecurity (06/14/2023)  Housing: Low Risk  (06/14/2023)  Transportation Needs: No Transportation Needs (06/14/2023)  Utilities: Not At Risk (06/14/2023)  Depression (PHQ2-9): Low Risk  (06/02/2023)  Financial Resource Strain: Low Risk  (12/30/2022)  Physical Activity: Insufficiently Active (12/30/2022)  Social Connections: Unknown (12/30/2022)  Stress: No Stress Concern Present (12/30/2022)  Tobacco Use: Low Risk  (06/14/2023)    Readmission Risk Interventions     No data to display

## 2023-06-23 NOTE — Plan of Care (Signed)
  Problem: Education: Goal: Knowledge of General Education information will improve Description Including pain rating scale, medication(s)/side effects and non-pharmacologic comfort measures Outcome: Progressing   

## 2023-06-23 NOTE — Consult Note (Signed)
Pharmacy Consult Note - Anticoagulation  Pharmacy Consult for Warfarin Indication: pulmonary embolus  PATIENT MEASUREMENTS: Height: 5\' 7"  (170.2 cm) Weight: (!) 151.3 kg (333 lb 8.9 oz) IBW/kg (Calculated) : 66.1 HEPARIN DW (KG): 104.5  Recent Labs    06/22/23 0354 06/23/23 0440  HGB 14.2  --   HCT 43.0  --   PLT 316  --   LABPROT 33.7* 29.9*  INR 3.3* 2.8*  CREATININE 1.64* 1.51*    Estimated Creatinine Clearance: 80.2 mL/min (A) (by C-G formula based on SCr of 1.51 mg/dL (H)).  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  Regular the workup has not been forthcoming) previously [facility]   Hepatic steatosis    Hypertension    Morbid obesity with BMI of 50.0-59.9, adult (HCC)    Oxygen deficiency    Peripheral vascular disease (HCC)    Presence of IVC filter    Has 2 IVC filters with significant thrombus burden superior to the filter.   Pulmonary embolus (HCC) 12/2016   Initially just treated with anticoagulation, but in February 2023 in April 2024 treated with EKOS thrombectomy for submassive PE.   Ventricular trigeminy    Weakness of both lower limbs     ASSESSMENT: 52 y.o. 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 CHADSVASc 4.   Patient is not a good candidate for DOAC due to CTEPH and failed therapies in the past. Will aim for an INR goal of 2.5 to 3 due  to recurrent VTEs. Patient has a large body mass which may cause the need for higher warfarin doses. Patient was subtherapeutic at admission after running out of mediation at home.    DDI metronidazole and ceftriaxone - completed 10/20, now on Keflex until 10/25 AM DDI with amiodarone 10/19 - stopped 10/22  Pertinent medications: PTA Warfarin 5 mg daily  Goal(s) of therapy: INR 2.5 - 3   Baseline anticoagulation labs: Recent Labs    06/21/23 0442 06/22/23 0354 06/23/23 0440  INR 3.3* 3.3* 2.8*  HGB 14.8 14.2  --   PLT 309 316  --    Heparin Plan: Heparin infusion stopped 10/20  Date INR Comment/Dose 10/15 1.6 Subtherapeutic  10/16 --- No INR, Warfarin 7.5 mg 10/17 1.8 Subtherapeutic, 10 mg 10/18  2.0 Subtherapeutic 10 mg 10/19  2.2 Subtherapeutic, 10 mg 10/20  2.8 Therapeutic, 8 mg 10/21   2.7 Therapeutic, 7.5 mg 10/22  3.3  SUPRA-therapeutic, 2 mg 10/23  3.3  SUPRA-therapeutic, 5 mg  10/24  2.8  Therapeutic  Plan: **Subtherapeutic on admission due to compliance issues. Per conversation with patient, INR changes drastically with dose changes but with delayed reaction (caution when boosting dose)  INR at goal, continue 5 mg daily and monitor dosing response now that amiodarone has been stopped. New DDI with metronidazole completed 10/20, amiodarone started 10/19 to stopped 10/22 and HF exacerbation (fluid overload) also influencing INR Continue  daily INR while inpatient CBC at least every 3 days; stable and no s/sx of bleeding noted.  Zacharey Jensen Rodriguez-Guzman PharmD, BCPS 06/23/2023 8:13 AM

## 2023-06-23 NOTE — Telephone Encounter (Signed)
Rodriguez-Guzman, Raquel, RPH-CPP  P [facility] Good morning! Patient will need INR check Wednesday at coumadin clinic (if possible). INR today is 2.8 prior to hospital discharge. Please call patient to set appointment. Long term warfarin use previously, followed by PCP with sub-therapeutic INT in last 2 recent admissions. Not a candidate for DOACs (failed Xarelto and Eliquis per hematology notes). Dx: recurrent DVT/PE, Afib and CTEPH.  Received message above. Called pt multiple times to schedule Coumadin Clinic appt in [place], no answer. Left message on voicemail with direct call back number.

## 2023-06-24 ENCOUNTER — Telehealth: Payer: Self-pay

## 2023-06-24 NOTE — Telephone Encounter (Signed)
I tried calling patient to schedule New Coumadin appt in [place], but mailbox is full on one # and no answer/mailbox on other.

## 2023-06-24 NOTE — Telephone Encounter (Addendum)
Called and spoke to pt who stated that he could not come into the office to have INR checked during the New Coumadin time slots. Pt going to work starting Monday 10/28 and will be in training from 9:00 A.M.- 4:00 P.M. Pt will come in on 10/29 before work to have INR checked at the lab in [place] and then will be called with INR results and updated dosing instructions.   Discussed the following information with patient over the phone.   Made pt aware that he will need a weekly INR check for at least the first 4 weeks and then a week can be added if INR is in range- up to 6 weeks.   A full discussion of the nature of anticoagulants has been carried out.  A benefit risk analysis has been presented to the patient, so that they understand the justification for choosing anticoagulation at this time. The need for frequent and regular monitoring, precise dosage adjustment and compliance is stressed.  Side effects of potential bleeding are discussed.  The patient should avoid any OTC items containing aspirin or ibuprofen, and should avoid great swings in general diet.  Avoid alcohol consumption.  Call if any signs of abnormal bleeding.

## 2023-06-27 ENCOUNTER — Telehealth: Payer: Self-pay

## 2023-06-27 NOTE — Progress Notes (Signed)
check if billed separately Has the patient been seen at the hospital within the last three years: Yes Total Score: 0 Level Of Care: ____ Electronic Signature(s) Signed: 06/27/2023 8:01:26 AM By: Ryan Pax RN Entered By: Ryan [name] on 05/31/2023 06:17:28 -------------------------------------------------------------------------------- Compression Therapy Details Patient Name: Date of Service: Ryan Elm RLES [name]. 05/31/2023 8:00 [name] Medical Record Number: [id number] Patient Account Number: [id number] Date of Birth/Sex: Treating RN: 28-Jun-1971 (52 y.o. Ryan [name] Ryan [name] Primary Care Ryan [name]: [name] Other Clinician: Referring Ryan [name]: Treating Ryan [name]/Extender: Ryan [name] ([id number]) [id number] Page 3 of 9 Weeks in Treatment: 6 Compression Therapy Performed for Wound Assessment: Wound #1 Left,Medial Lower Leg Performed By: Clinician Ryan Pax, RN Compression Type: Double Layer Post Procedure Diagnosis Same as Pre-procedure Electronic Signature(s) Signed: 05/31/2023 9:15:55 AM By: Ryan Pax RN Entered By: Ryan [name] on 05/31/2023 06:15:55 -------------------------------------------------------------------------------- Compression Therapy Details Patient Name: Date of Service: Ryan Elm RLES [name]. 05/31/2023 8:00 [name] Medical Record Number: [id number] Patient Account Number: [id number] Date of Birth/Sex: Treating RN: 1971-05-12 (52 y.o. Ryan [name] Ryan [name] Primary Care Ryan [name]: [name] Other Clinician: Referring Colman Birdwell: Treating Ryan [name]/Extender: Ryan [name] in Treatment: 6 Compression Therapy Performed for Wound Assessment: Wound #2 Left,Medial Foot Performed By: Clinician Ryan Pax, RN Compression Type: Double  Layer Post Procedure Diagnosis Same as Pre-procedure Electronic Signature(s) Signed: 05/31/2023 9:15:56 AM By: Ryan Pax RN Entered By: Ryan [name] on 05/31/2023 06:15:55 -------------------------------------------------------------------------------- Encounter Discharge Information Details Patient Name: Date of Service: Ryan Elm RLES [name]. 05/31/2023 8:00 [name] Medical Record Number: [id number] Patient Account Number: [id number] Date of Birth/Sex: Treating RN: [birth year] (52 y.o. Ryan [name] Ryan [name] Primary Care Ryan [name]: [name] Other Clinician: Referring Breslin Burklow: Treating Ryan [name]/Extender: Ryan [name] in Treatment: 6 Encounter Discharge Information Items Discharge Condition: Stable Ambulatory Status: Ambulatory Discharge Destination: Home Transportation: [address] Ryan [name], Ryan [name] ([id number]) [id number] Page 4 of 9 Accompanied By: self Schedule Follow-up Appointment: Yes Clinical Summary of Care: Notes noted patient with heavy breathing , pulse ox obtained 89% on room air, instructed on pursed lip breathing tech. sats increased to 92% after 5 breaths notified Ryan Derry PA , patient has appointment with pulmonary tomorrow , PA contacted pulmonary and they moved patients appointment up to 11:30am today, called [employer] called to transport patient over to pulmonary . patient stable at this time o2 sat 92% on room air no difficulty breathing noted Electronic Signature(s) Signed: 05/31/2023 9:56:41 AM By: Ryan Pax RN Entered By: Ryan [name] on 05/31/2023 06:56:41 -------------------------------------------------------------------------------- Lower Extremity Assessment Details Patient Name: Date of Service: Ryan Elm RLES [name]. 05/31/2023 8:00 [name] Medical Record Number: [id number] Patient Account Number: [id number] Date of Birth/Sex: Treating RN: [birth year] (52 y.o. Ryan [name]) Ryan [name] Primary Care Jakiah Goree: [name]  Other Clinician: Referring Vanilla Heatherington: Treating Tersea Aulds/Extender: Ryan [name] in Treatment: 6 Edema Assessment Assessed: [Left: No] [Right: No] Edema: [Left: Ye] [Right: s] Calf Left: Right: Point of Measurement: 36 cm From Medial Instep 50 cm Ankle Left: Right: Point of Measurement: 10 cm From Medial Instep 31 cm Knee To Floor Left: Right: From Medial Instep 42 cm Vascular Assessment Pulses: Dorsalis Pedis Palpable: [Left:Yes] Extremity colors, hair growth, and conditions: Extremity Color: [Left:Hyperpigmented] Hair Growth on Extremity: [Left:No] Temperature of Extremity: [Left:Warm] Capillary Refill: [Left:< 3 seconds] Dependent Rubor: [Left:No] Blanched when Elevated: [Left:No Yes] Toe Nail Assessment Left: Right: Thick: Yes Discolored: Yes Deformed: Yes Improper Length and Hygiene: Ryan [name], Ryan [name] ([id number]) [id number] Page  check if billed separately Has the patient been seen at the hospital within the last three years: Yes Total Score: 0 Level Of Care: ____ Electronic Signature(s) Signed: 06/27/2023 8:01:26 AM By: Ryan Pax RN Entered By: Ryan [name] on 05/31/2023 06:17:28 -------------------------------------------------------------------------------- Compression Therapy Details Patient Name: Date of Service: Ryan Elm RLES [name]. 05/31/2023 8:00 [name] Medical Record Number: [id number] Patient Account Number: [id number] Date of Birth/Sex: Treating RN: 28-Jun-1971 (52 y.o. Ryan [name] Ryan [name] Primary Care Ryan [name]: [name] Other Clinician: Referring Ryan [name]: Treating Ryan [name]/Extender: Ryan [name] ([id number]) [id number] Page 3 of 9 Weeks in Treatment: 6 Compression Therapy Performed for Wound Assessment: Wound #1 Left,Medial Lower Leg Performed By: Clinician Ryan Pax, RN Compression Type: Double Layer Post Procedure Diagnosis Same as Pre-procedure Electronic Signature(s) Signed: 05/31/2023 9:15:55 AM By: Ryan Pax RN Entered By: Ryan [name] on 05/31/2023 06:15:55 -------------------------------------------------------------------------------- Compression Therapy Details Patient Name: Date of Service: Ryan Elm RLES [name]. 05/31/2023 8:00 [name] Medical Record Number: [id number] Patient Account Number: [id number] Date of Birth/Sex: Treating RN: 1971-05-12 (52 y.o. Ryan [name] Ryan [name] Primary Care Ryan [name]: [name] Other Clinician: Referring Colman Birdwell: Treating Ryan [name]/Extender: Ryan [name] in Treatment: 6 Compression Therapy Performed for Wound Assessment: Wound #2 Left,Medial Foot Performed By: Clinician Ryan Pax, RN Compression Type: Double  Layer Post Procedure Diagnosis Same as Pre-procedure Electronic Signature(s) Signed: 05/31/2023 9:15:56 AM By: Ryan Pax RN Entered By: Ryan [name] on 05/31/2023 06:15:55 -------------------------------------------------------------------------------- Encounter Discharge Information Details Patient Name: Date of Service: Ryan Elm RLES [name]. 05/31/2023 8:00 [name] Medical Record Number: [id number] Patient Account Number: [id number] Date of Birth/Sex: Treating RN: [birth year] (52 y.o. Ryan [name] Ryan [name] Primary Care Ryan [name]: [name] Other Clinician: Referring Breslin Burklow: Treating Ryan [name]/Extender: Ryan [name] in Treatment: 6 Encounter Discharge Information Items Discharge Condition: Stable Ambulatory Status: Ambulatory Discharge Destination: Home Transportation: [address] Ryan [name], Ryan [name] ([id number]) [id number] Page 4 of 9 Accompanied By: self Schedule Follow-up Appointment: Yes Clinical Summary of Care: Notes noted patient with heavy breathing , pulse ox obtained 89% on room air, instructed on pursed lip breathing tech. sats increased to 92% after 5 breaths notified Ryan Derry PA , patient has appointment with pulmonary tomorrow , PA contacted pulmonary and they moved patients appointment up to 11:30am today, called [employer] called to transport patient over to pulmonary . patient stable at this time o2 sat 92% on room air no difficulty breathing noted Electronic Signature(s) Signed: 05/31/2023 9:56:41 AM By: Ryan Pax RN Entered By: Ryan [name] on 05/31/2023 06:56:41 -------------------------------------------------------------------------------- Lower Extremity Assessment Details Patient Name: Date of Service: Ryan Elm RLES [name]. 05/31/2023 8:00 [name] Medical Record Number: [id number] Patient Account Number: [id number] Date of Birth/Sex: Treating RN: [birth year] (52 y.o. Ryan [name]) Ryan [name] Primary Care Jakiah Goree: [name]  Other Clinician: Referring Vanilla Heatherington: Treating Tersea Aulds/Extender: Ryan [name] in Treatment: 6 Edema Assessment Assessed: [Left: No] [Right: No] Edema: [Left: Ye] [Right: s] Calf Left: Right: Point of Measurement: 36 cm From Medial Instep 50 cm Ankle Left: Right: Point of Measurement: 10 cm From Medial Instep 31 cm Knee To Floor Left: Right: From Medial Instep 42 cm Vascular Assessment Pulses: Dorsalis Pedis Palpable: [Left:Yes] Extremity colors, hair growth, and conditions: Extremity Color: [Left:Hyperpigmented] Hair Growth on Extremity: [Left:No] Temperature of Extremity: [Left:Warm] Capillary Refill: [Left:< 3 seconds] Dependent Rubor: [Left:No] Blanched when Elevated: [Left:No Yes] Toe Nail Assessment Left: Right: Thick: Yes Discolored: Yes Deformed: Yes Improper Length and Hygiene: Ryan [name], Ryan [name] ([id number]) [id number] Page  Ryan [name], Ryan [name] ([id number]) [id number] Page 1 of 9 Visit Report for 05/31/2023 Arrival Information Details Patient Name: Date of Service: Ryan [name], Ryan RLES [name]. 05/31/2023 8:00 [name] Medical Record Number: [id number] Patient Account Number: [id number] Date of Birth/Sex: Treating RN: 01-11-1971 (52 y.o. Ryan [name]) Ryan [name] Primary Care Ashaya Raftery: [name] Other Clinician: Referring Yoshiharu Brassell: Treating Siddarth Hsiung/Extender: Ryan [name] in Treatment: 6 Visit Information History Since Last Visit Added or deleted any medications: No Patient Arrived: Ambulatory Any new allergies or adverse reactions: No Arrival Time: 08:10 Had [name] fall or experienced change in No Accompanied By: self activities of daily living that may affect Transfer Assistance: None risk of falls: Patient Identification Verified: Yes Signs or symptoms of abuse/neglect since last visito No Secondary Verification Process Completed: Yes Hospitalized since last visit: No Patient Requires Transmission-Based Precautions: No Implantable device outside of the clinic excluding No Patient Has Alerts: Yes cellular tissue based products placed in the center Patient Alerts: Patient on Blood Thinner since last visit: diabetic Has Dressing in Place as Prescribed: Yes Has Compression in Place as Prescribed: Yes Pain Present Now: No Electronic Signature(s) Signed: 06/27/2023 8:01:26 AM By: Ryan Pax RN Entered By: Ryan [name] on 05/31/2023 05:10:46 -------------------------------------------------------------------------------- Clinic Level of Care Assessment Details Patient Name: Date of Service: Ryan [name], Ryan [name]. 05/31/2023 8:00 [name] Medical Record Number: [id number] Patient Account Number: [id number] Date of Birth/Sex: Treating RN: 03-Jun-1971 (52 y.o. Ryan [name]) Ryan [name] Primary Care Ashonte Angelucci: [name] Other Clinician: Referring Panayiota Larkin: Treating Rolinda Impson/Extender: Ryan [name] in Treatment: 6 Clinic Level of Care Assessment Items TOOL 1 Quantity Score []  - 0 Use when EandM and Procedure is performed on INITIAL visit ASSESSMENTS - Nursing Assessment / Reassessment []  - 0 General Physical Exam (combine w/ comprehensive assessment (listed just below) when performed on new pt. evals) []  - 0 Comprehensive Assessment (HX, ROS, Risk Assessments, Wounds Hx, etc.) Ryan [name], Ryan [name] ([id number]) [phone].pdf Page 2 of 9 ASSESSMENTS - Wound and Skin Assessment / Reassessment []  - 0 Dermatologic / Skin Assessment (not related to wound area) ASSESSMENTS - Ostomy and/or Continence Assessment and Care []  - 0 Incontinence Assessment and Management []  - 0 Ostomy Care Assessment and Management (repouching, etc.) PROCESS - Coordination of Care []  - 0 Simple Patient / Family Education for ongoing care []  - 0 Complex (extensive) Patient / Family Education for ongoing care []  - 0 Staff obtains Chiropractor, Records, T Results / Process Orders est []  - 0 Staff telephones HHA, Nursing Homes / Clarify orders / etc []  - 0 Routine Transfer to another Facility (non-emergent condition) []  - 0 Routine Hospital Admission (non-emergent condition) []  - 0 New Admissions / Manufacturing engineer / Ordering NPWT Apligraf, etc. , []  - 0 Emergency Hospital Admission (emergent condition) PROCESS - Special Needs []  - 0 Pediatric / Minor Patient Management []  - 0 Isolation Patient Management []  - 0 Hearing / Language / Visual special needs []  - 0 Assessment of Community assistance (transportation, D/C planning, etc.) []  - 0 Additional assistance / Altered mentation []  - 0 Support Surface(s) Assessment (bed, cushion, seat, etc.) INTERVENTIONS - Miscellaneous []  - 0 External ear exam []  - 0 Patient Transfer (multiple staff / Nurse, adult / Similar devices) []  - 0 Simple Staple / Suture removal (25 or less) []  - 0 Complex  Staple / Suture removal (26 or more) []  - 0 Hypo/Hyperglycemic Management (do not check if billed separately) []  - 0 Ankle / Brachial Index (ABI) - do not  check if billed separately Has the patient been seen at the hospital within the last three years: Yes Total Score: 0 Level Of Care: ____ Electronic Signature(s) Signed: 06/27/2023 8:01:26 AM By: Ryan Pax RN Entered By: Ryan [name] on 05/31/2023 06:17:28 -------------------------------------------------------------------------------- Compression Therapy Details Patient Name: Date of Service: Ryan Elm RLES [name]. 05/31/2023 8:00 [name] Medical Record Number: [id number] Patient Account Number: [id number] Date of Birth/Sex: Treating RN: 28-Jun-1971 (52 y.o. Ryan [name] Ryan [name] Primary Care Ryan [name]: [name] Other Clinician: Referring Ryan [name]: Treating Ryan [name]/Extender: Ryan [name] ([id number]) [id number] Page 3 of 9 Weeks in Treatment: 6 Compression Therapy Performed for Wound Assessment: Wound #1 Left,Medial Lower Leg Performed By: Clinician Ryan Pax, RN Compression Type: Double Layer Post Procedure Diagnosis Same as Pre-procedure Electronic Signature(s) Signed: 05/31/2023 9:15:55 AM By: Ryan Pax RN Entered By: Ryan [name] on 05/31/2023 06:15:55 -------------------------------------------------------------------------------- Compression Therapy Details Patient Name: Date of Service: Ryan Elm RLES [name]. 05/31/2023 8:00 [name] Medical Record Number: [id number] Patient Account Number: [id number] Date of Birth/Sex: Treating RN: 1971-05-12 (52 y.o. Ryan [name] Ryan [name] Primary Care Ryan [name]: [name] Other Clinician: Referring Colman Birdwell: Treating Ryan [name]/Extender: Ryan [name] in Treatment: 6 Compression Therapy Performed for Wound Assessment: Wound #2 Left,Medial Foot Performed By: Clinician Ryan Pax, RN Compression Type: Double  Layer Post Procedure Diagnosis Same as Pre-procedure Electronic Signature(s) Signed: 05/31/2023 9:15:56 AM By: Ryan Pax RN Entered By: Ryan [name] on 05/31/2023 06:15:55 -------------------------------------------------------------------------------- Encounter Discharge Information Details Patient Name: Date of Service: Ryan Elm RLES [name]. 05/31/2023 8:00 [name] Medical Record Number: [id number] Patient Account Number: [id number] Date of Birth/Sex: Treating RN: [birth year] (52 y.o. Ryan [name] Ryan [name] Primary Care Ryan [name]: [name] Other Clinician: Referring Breslin Burklow: Treating Ryan [name]/Extender: Ryan [name] in Treatment: 6 Encounter Discharge Information Items Discharge Condition: Stable Ambulatory Status: Ambulatory Discharge Destination: Home Transportation: [address] Ryan [name], Ryan [name] ([id number]) [id number] Page 4 of 9 Accompanied By: self Schedule Follow-up Appointment: Yes Clinical Summary of Care: Notes noted patient with heavy breathing , pulse ox obtained 89% on room air, instructed on pursed lip breathing tech. sats increased to 92% after 5 breaths notified Ryan Derry PA , patient has appointment with pulmonary tomorrow , PA contacted pulmonary and they moved patients appointment up to 11:30am today, called [employer] [facility] called to transport patient over to pulmonary . patient stable at this time o2 sat 92% on room air no difficulty breathing noted Electronic Signature(s) Signed: 05/31/2023 9:56:41 AM By: Ryan Pax RN Entered By: Ryan [name] on 05/31/2023 06:56:41 -------------------------------------------------------------------------------- Lower Extremity Assessment Details Patient Name: Date of Service: Ryan Elm RLES [name]. 05/31/2023 8:00 [name] Medical Record Number: [id number] Patient Account Number: [id number] Date of Birth/Sex: Treating RN: [birth year] (52 y.o. Ryan [name]) Ryan [name] Primary Care Jakiah Goree: [name]  Other Clinician: Referring Vanilla Heatherington: Treating Tersea Aulds/Extender: Ryan [name] in Treatment: 6 Edema Assessment Assessed: [Left: No] [Right: No] Edema: [Left: Ye] [Right: s] Calf Left: Right: Point of Measurement: 36 cm From Medial Instep 50 cm Ankle Left: Right: Point of Measurement: 10 cm From Medial Instep 31 cm Knee To Floor Left: Right: From Medial Instep 42 cm Vascular Assessment Pulses: Dorsalis Pedis Palpable: [Left:Yes] Extremity colors, hair growth, and conditions: Extremity Color: [Left:Hyperpigmented] Hair Growth on Extremity: [Left:No] Temperature of Extremity: [Left:Warm] Capillary Refill: [Left:< 3 seconds] Dependent Rubor: [Left:No] Blanched when Elevated: [Left:No Yes] Toe Nail Assessment Left: Right: Thick: Yes Discolored: Yes Deformed: Yes Improper Length and Hygiene: Ryan [name], Ryan [name] ([id number]) [id number] Page

## 2023-06-27 NOTE — Transitions of Care (Post Inpatient/ED Visit) (Signed)
06/27/2023  Name: Ryan [name] MRN: [id number] DOB: 09-09-1970  Today's TOC FU Call Status: Today's TOC FU Call Status:: Successful TOC FU Call Completed TOC FU Call Complete Date: 06/27/23 Patient's Name and Date of Birth confirmed.  Transition Care Management Follow-up Telephone Call Date of Discharge: 06/23/23 Discharge Facility: [facility]) Type of Discharge: Inpatient Admission Primary Inpatient Discharge Diagnosis:: Hypoxia How have you been since you were released from the hospital?: Better Any questions or concerns?: No  Items Reviewed: Did you receive and understand the discharge instructions provided?: Yes Medications obtained,verified, and reconciled?: Yes (Medications Reviewed) Any new allergies since your discharge?: No Dietary orders reviewed?: NA Do you have support at home?: Yes People in Home: spouse Name of Support/Comfort Primary Source: Ryan [name]  Medications Reviewed Today: Medications Reviewed Today     Reviewed by Redge Gainer, RN (Case Manager) on 06/27/23 at 1415  Med List Status: <None>   Medication Order Taking? Sig Documenting Provider Last Dose Status Informant  albuterol (VENTOLIN HFA) 108 (90 Base) MCG/ACT inhaler [id number]  Inhale 2 puffs into the lungs every 6 (six) hours as needed for wheezing or shortness of breath. Sunnie Nielsen, DO  Active Self  ascorbic acid (VITAMIN C) 500 MG tablet [id number]  Take 1 tablet (500 mg total) by mouth 2 (two) times daily. Loyce Dys, MD  Active   digoxin 62.5 MCG TABS [id number]  Take 0.0625 mg by mouth daily. Loyce Dys, MD  Active   furosemide (LASIX) 40 MG tablet [id number]  Take 1 tablet (40 mg total) by mouth every other day. Mecum, Erin E, PA-C  Active Self  losartan (COZAAR) 25 MG tablet [id number]  Take 1 tablet (25 mg total) by mouth daily. Loyce Dys, MD  Active   metoprolol succinate (TOPROL-XL) 50 MG 24 hr tablet [id number]  Take 1 tablet (50 mg  total) by mouth daily. Take with or immediately following a meal. Djan, Scarlette Calico, MD  Active   Multiple Vitamin (MULTIVITAMIN WITH MINERALS) TABS tablet [id number]  Take 1 tablet by mouth daily. Loyce Dys, MD  Active   neomycin-bacitracin-polymyxin [phone] OINT [id number]  Apply 1 Application topically 3 (three) times daily. Loyce Dys, MD  Active   OXYGEN [id number]  Inhale 1 L into the lungs daily. [provider]  Active Self  spironolactone (ALDACTONE) 25 MG tablet [id number]  Take 1 tablet (25 mg total) by mouth daily. Loyce Dys, MD  Active   warfarin (COUMADIN) 5 MG tablet [id number]  Take 1 tablet (5 mg total) by mouth daily. [name], NP  Active             Home Care and Equipment/Supplies: Were Home Health Services Ordered?: No Any new equipment or medical supplies ordered?: No (Continues with [facility] for Oxygen)  Functional Questionnaire: Do you need assistance with bathing/showering or dressing?: No Do you need assistance with meal preparation?: No Do you need assistance with eating?: No Do you have difficulty maintaining continence: No Do you need assistance with getting out of bed/getting out of a chair/moving?: No Do you have difficulty managing or taking your medications?: No  Follow up appointments reviewed: PCP Follow-up appointment confirmed?: Yes Date of PCP follow-up appointment?: 07/11/23 Follow-up Provider: [name] Specialist [facility] Follow-up appointment confirmed?: Yes Date of Specialist follow-up appointment?: 06/28/23 Follow-Up Specialty Provider:: Wound clinic Do you need transportation to your follow-up appointment?: No Do you understand care options if your condition(s) worsen?: Yes-patient verbalized understanding  SDOH Interventions Today    Flowsheet Row Most Recent Value  SDOH Interventions   Food Insecurity Interventions Intervention Not Indicated  Transportation Interventions Intervention Not Indicated       The patient is independent and going back to work on Wednesday 06/29/23. Declined enrollment in the 30 day program.  Deidre Ala, RN RN Care Manager [facility] [phone]

## 2023-06-28 ENCOUNTER — Telehealth: Payer: Self-pay

## 2023-06-28 ENCOUNTER — Encounter: Payer: No Typology Code available for payment source | Admitting: Physician Assistant

## 2023-06-28 DIAGNOSIS — E11622 Type 2 diabetes mellitus with other skin ulcer: Secondary | ICD-10-CM | POA: Diagnosis present

## 2023-06-28 DIAGNOSIS — E11621 Type 2 diabetes mellitus with foot ulcer: Secondary | ICD-10-CM | POA: Diagnosis not present

## 2023-06-28 DIAGNOSIS — I890 Lymphedema, not elsewhere classified: Secondary | ICD-10-CM | POA: Diagnosis not present

## 2023-06-28 DIAGNOSIS — I5042 Chronic combined systolic (congestive) and diastolic (congestive) heart failure: Secondary | ICD-10-CM | POA: Diagnosis not present

## 2023-06-28 DIAGNOSIS — I87332 Chronic venous hypertension (idiopathic) with ulcer and inflammation of left lower extremity: Secondary | ICD-10-CM | POA: Diagnosis not present

## 2023-06-28 DIAGNOSIS — I130 Hypertensive heart and chronic kidney disease with heart failure and stage 1 through stage 4 chronic kidney disease, or unspecified chronic kidney disease: Secondary | ICD-10-CM | POA: Diagnosis not present

## 2023-06-28 DIAGNOSIS — L97822 Non-pressure chronic ulcer of other part of left lower leg with fat layer exposed: Secondary | ICD-10-CM | POA: Diagnosis not present

## 2023-06-28 DIAGNOSIS — E1151 Type 2 diabetes mellitus with diabetic peripheral angiopathy without gangrene: Secondary | ICD-10-CM | POA: Diagnosis not present

## 2023-06-28 DIAGNOSIS — I2782 Chronic pulmonary embolism: Secondary | ICD-10-CM | POA: Diagnosis not present

## 2023-06-28 DIAGNOSIS — L97522 Non-pressure chronic ulcer of other part of left foot with fat layer exposed: Secondary | ICD-10-CM | POA: Diagnosis not present

## 2023-06-28 DIAGNOSIS — N183 Chronic kidney disease, stage 3 unspecified: Secondary | ICD-10-CM | POA: Diagnosis not present

## 2023-06-28 NOTE — Progress Notes (Addendum)
check if billed separately Has the patient been seen at the hospital within the last three years: Yes Total Score: 0 Level Of Care: ____ Electronic Signature(s) Unsigned Entered By: Yevonne Pax on 06/28/2023 15:27:47 -------------------------------------------------------------------------------- Compression Therapy Details Patient Name: Date of Service: Ryan [name], [name]. 06/28/2023 8:00 A M Medical Record Number: [id number] Patient Account Number: [id number] Date of Birth/Sex: Treating RN: 07/30/1971 (52 y.o. [name] Primary Care Caedmon Louque: [name] Other Clinician: DEMANI, MAJEWSKI ([id number]) [phone].pdf Page 3 of 11 Referring Jaella Weinert: Treating Areli Frary/Extender: [name] in Treatment: 10 Compression Therapy Performed for Wound Assessment: Wound #3 Right,Anterior Lower Leg Performed By: Clinician Yevonne Pax, RN Compression Type: Double Layer Post Procedure Diagnosis Same as Pre-procedure Electronic Signature(s) Signed: 06/28/2023 3:27:31 PM By: Yevonne Pax RN Entered By: Yevonne Pax on 06/28/2023 15:27:30 -------------------------------------------------------------------------------- Compression Therapy Details Patient Name: Date of Service: Ryan [name], Ryan RLES A. 06/28/2023 8:00 A M Medical Record Number: [id number] Patient Account Number: [id number] Date of Birth/Sex: Treating RN: [birth year] (52 y.o. [name] Primary Care Cheston Coury: [name] Other Clinician: Referring Karin Pinedo: Treating Halsey Persaud/Extender: [name] in Treatment: 10 Compression Therapy Performed for Wound Assessment: Wound #1 Left,Medial Lower Leg Performed By: Clinician Yevonne Pax, RN Compression Type: Double Layer Post Procedure  Diagnosis Same as Pre-procedure Electronic Signature(s) Signed: 06/28/2023 3:27:31 PM By: Yevonne Pax RN Entered By: Yevonne Pax on 06/28/2023 15:27:31 -------------------------------------------------------------------------------- Encounter Discharge Information Details Patient Name: Date of Service: Ryan Elm RLES A. 06/28/2023 8:00 A M Medical Record Number: [id number] Patient Account Number: [id number] Date of Birth/Sex: Treating RN: [birth year] (52 y.o. [name] Primary Care Azriel Jakob: [name] Other Clinician: Referring Kiyoto Slomski: Treating Tiffiny Worthy/Extender: [name] in Treatment: 10 Encounter Discharge Information Items Discharge Condition: Stable Ambulatory Status: Ambulatory Discharge Destination: Home KAYDEN, GUSTAFSON A ([id number]) [phone].pdf Page 4 of 11 Transportation: Private Auto Accompanied By: self Schedule Follow-up Appointment: Yes Clinical Summary of Care: Electronic Signature(s) Signed: 06/28/2023 3:28:38 PM By: Yevonne Pax RN Entered By: Yevonne Pax on 06/28/2023 15:28:38 -------------------------------------------------------------------------------- Lower Extremity Assessment Details Patient Name: Date of Service: Ryan [name], Ryan RLES A. 06/28/2023 8:00 A M Medical Record Number: [id number] Patient Account Number: [id number] Date of Birth/Sex: Treating RN: [birth year] (52 y.o. Ryan [name]) Yevonne Pax Primary Care Thor Nannini: [name] Other Clinician: Referring Zeynab Klett: Treating Jozi Malachi/Extender: [name] in Treatment: 10 Edema Assessment Assessed: [name]: No] [Right: No] [Left: Edema] [Right: :] Calf Left: Right: Point of Measurement: 36 cm From Medial Instep 53 cm 59 cm Ankle Left: Right: Point of Measurement: 10 cm From Medial Instep 32 cm 31 cm Knee To Floor Left: Right: From Medial Instep 42 cm 42 cm Vascular Assessment Pulses: Dorsalis Pedis Palpable:  [Left:Yes] [Right:Yes] Extremity colors, hair growth, and conditions: Extremity Color: [Left:Hyperpigmented] [Right:Hyperpigmented] Hair Growth on Extremity: [Left:No] [Right:No] Temperature of Extremity: [Left:Warm] [Right:Warm] Capillary Refill: [Left:< 3 seconds] [Right:< 3 seconds] Dependent Rubor: [Left:No] [Right:No] Blanched when Elevated: [Left:No Yes] [Right:No Yes] Toe Nail Assessment Left: Right: Thick: Yes Yes Discolored: Yes Yes Deformed: Yes Yes Improper Length and Hygiene: Yes Yes Electronic Signature(s) Signed: 06/28/2023 3:20:47 PM By: Yevonne Pax RN Logiudice, Tiara A10/29/2024 3:20:47 PM By: Yevonne Pax RN Signed: ([id number]) [id number] Page 5 of 11 Entered By: Yevonne Pax on 06/28/2023 08:18:52 -------------------------------------------------------------------------------- Multi Wound Chart Details Patient Name: Date of Service: Ryan [name], Ryan RLES A. 06/28/2023 8:00 A M Medical Record Number: [id number] Patient Account Number: [id number] Date of Birth/Sex: Treating RN: 09/21/1970 (52 y.o. Ryan [name]) Jettie Pagan, [place]  Etiology: Wound Location: Right, Anterior Lower Leg Wound Open Wounding Event: Gradually Appeared Status: Date Acquired: 06/21/2023 Comorbid Arrhythmia, Congestive Heart Failure, Hypertension, Peripheral Weeks Of Treatment: 0 History: Venous Disease, Type II Diabetes Clustered Wound: No Photos Wound Measurements Length: (cm) 0.7 Width: (cm) 0.8 Depth: (cm) 0.2 Area: (cm) 0.44 Volume: (cm) 0.088 % Reduction in Area: % Reduction in Volume: Epithelialization: None Tunneling: No Undermining: No Wound Description Classification: Grade 1 Exudate Amount: Medium Foul Odor After Cleansing: No Slough/Fibrino Yes Wound Bed Granulation Amount: Medium (34-66%) Exposed Structure Granulation Quality: Red Fascia Exposed: No Necrotic Amount: Medium (34-66%) Fat Layer (Subcutaneous Tissue) Exposed: Yes Necrotic Quality: Adherent Slough Tendon Exposed: No Muscle Exposed: No Joint Exposed: No Bone Exposed: No Treatment Notes Wound #3 (Lower Leg) Wound Laterality: Right, Anterior Cleanser Soap and Water Discharge Instruction: Gently cleanse wound with antibacterial soap, rinse and pat dry prior to dressing wounds Wound Cleanser Discharge Instruction: Wash your hands with soap and water. Remove old dressing, discard into plastic bag and place into trash.  Cleanse the wound with Wound Cleanser prior to applying a clean dressing using gauze sponges, not tissues or cotton balls. Do not scrub or use excessive force. Pat dry using gauze sponges, not tissue or cotton balls. Peri-Wound Care Topical Primary Dressing Hydrofera Blue Ready Transfer Foam, 2.5x2.5 (in/in) Discharge Instruction: Apply Hydrofera Blue Ready to wound bed as directed Secondary Dressing ABD Pad 5x9 (in/in) Discharge Instruction: Cover with ABD pad Secured With Compression Wrap Urgo K2, two layer compression system, large Compression Stockings Add-Ons Macho, Cadon A ([id number]) [id number] Page 11 of 11 Electronic Signature(s) Signed: 06/28/2023 3:20:47 PM By: Yevonne Pax RN Entered By: Yevonne Pax on 06/28/2023 08:16:52 -------------------------------------------------------------------------------- Vitals Details Patient Name: Date of Service: Ryan Elm RLES A. 06/28/2023 8:00 A M Medical Record Number: [id number] Patient Account Number: [id number] Date of Birth/Sex: Treating RN: [birth year] (52 y.o. Ryan [name]) Yevonne Pax Primary Care Kashara Blocher: [name] Other Clinician: Referring Killian Schwer: Treating Ceili Boshers/Extender: [name] in Treatment: 10 Vital Signs Time Taken: 08:12 Temperature (F): 97.8 Height (in): 67 Pulse (bpm): 78 Weight (lbs): 338 Respiratory Rate (breaths/min): 18 Body Mass Index (BMI): 52.9 Blood Pressure (mmHg): 143/93 Reference Range: 80 - 120 mg / dl Electronic Signature(s) Signed: 06/28/2023 3:20:47 PM By: Yevonne Pax RN Entered By: Yevonne Pax on 06/28/2023 08:12:53  Etiology: Wound Location: Right, Anterior Lower Leg Wound Open Wounding Event: Gradually Appeared Status: Date Acquired: 06/21/2023 Comorbid Arrhythmia, Congestive Heart Failure, Hypertension, Peripheral Weeks Of Treatment: 0 History: Venous Disease, Type II Diabetes Clustered Wound: No Photos Wound Measurements Length: (cm) 0.7 Width: (cm) 0.8 Depth: (cm) 0.2 Area: (cm) 0.44 Volume: (cm) 0.088 % Reduction in Area: % Reduction in Volume: Epithelialization: None Tunneling: No Undermining: No Wound Description Classification: Grade 1 Exudate Amount: Medium Foul Odor After Cleansing: No Slough/Fibrino Yes Wound Bed Granulation Amount: Medium (34-66%) Exposed Structure Granulation Quality: Red Fascia Exposed: No Necrotic Amount: Medium (34-66%) Fat Layer (Subcutaneous Tissue) Exposed: Yes Necrotic Quality: Adherent Slough Tendon Exposed: No Muscle Exposed: No Joint Exposed: No Bone Exposed: No Treatment Notes Wound #3 (Lower Leg) Wound Laterality: Right, Anterior Cleanser Soap and Water Discharge Instruction: Gently cleanse wound with antibacterial soap, rinse and pat dry prior to dressing wounds Wound Cleanser Discharge Instruction: Wash your hands with soap and water. Remove old dressing, discard into plastic bag and place into trash.  Cleanse the wound with Wound Cleanser prior to applying a clean dressing using gauze sponges, not tissues or cotton balls. Do not scrub or use excessive force. Pat dry using gauze sponges, not tissue or cotton balls. Peri-Wound Care Topical Primary Dressing Hydrofera Blue Ready Transfer Foam, 2.5x2.5 (in/in) Discharge Instruction: Apply Hydrofera Blue Ready to wound bed as directed Secondary Dressing ABD Pad 5x9 (in/in) Discharge Instruction: Cover with ABD pad Secured With Compression Wrap Urgo K2, two layer compression system, large Compression Stockings Add-Ons Macho, Cadon A ([id number]) [id number] Page 11 of 11 Electronic Signature(s) Signed: 06/28/2023 3:20:47 PM By: Yevonne Pax RN Entered By: Yevonne Pax on 06/28/2023 08:16:52 -------------------------------------------------------------------------------- Vitals Details Patient Name: Date of Service: Ryan Elm RLES A. 06/28/2023 8:00 A M Medical Record Number: [id number] Patient Account Number: [id number] Date of Birth/Sex: Treating RN: [birth year] (52 y.o. Ryan [name]) Yevonne Pax Primary Care Kashara Blocher: [name] Other Clinician: Referring Killian Schwer: Treating Ceili Boshers/Extender: [name] in Treatment: 10 Vital Signs Time Taken: 08:12 Temperature (F): 97.8 Height (in): 67 Pulse (bpm): 78 Weight (lbs): 338 Respiratory Rate (breaths/min): 18 Body Mass Index (BMI): 52.9 Blood Pressure (mmHg): 143/93 Reference Range: 80 - 120 mg / dl Electronic Signature(s) Signed: 06/28/2023 3:20:47 PM By: Yevonne Pax RN Entered By: Yevonne Pax on 06/28/2023 08:12:53  Ryan [name] Other Clinician: Referring Marcel Sorter: Treating Ralphael Southgate/Extender: [name] in Treatment:  10 Active Problems Location of Pain Severity and Description of Pain Patient Has Paino No Site Locations Pain Management and Medication Current Pain Management: Electronic Signature(s) Signed: 06/28/2023 3:20:47 PM By: Yevonne Pax RN Entered By: Yevonne Pax on 06/28/2023 08:13:04 -------------------------------------------------------------------------------- Patient/Caregiver Education Details Patient Name: Date of Service: Ryan Jaeger A. 10/29/2024andnbsp8:00 A M Medical Record Number: [id number] Patient Account Number: [id number] NESSIAH, PHA ([id number]) [id number].pdf Page 8 of 11 Date of Birth/Gender: Treating RN: December 24, 1970 (52 y.o. Ryan [name]) Yevonne Pax Primary Care Physician: [name] Other Clinician: Referring Physician: Treating Physician/Extender: [name] in Treatment: 10 Education Assessment Education Provided To: Patient Education Topics Provided Wound/Skin Impairment: Handouts: Caring for Your Ulcer Methods: Explain/Verbal Responses: State content correctly Electronic Signature(s) Signed: 06/28/2023 3:20:47 PM By: Yevonne Pax RN Entered By: Yevonne Pax on 06/28/2023 08:20:03 -------------------------------------------------------------------------------- Wound Assessment Details Patient Name: Date of Service: Ryan Elm RLES A. 06/28/2023 8:00 A M Medical Record Number: [id number] Patient Account Number: [id number] Date of Birth/Sex: Treating RN: [birth year] (52 y.o. Ryan [name]) Yevonne Pax Primary Care Anaysia Germer: [name] Other Clinician: Referring Yaris Ferrell: Treating Jeremie Giangrande/Extender: [name] in Treatment: 10 Wound Status Wound Number: 1 Primary Diabetic Wound/Ulcer of the Lower Extremity Etiology: Wound Location: Left, Medial Lower Leg Wound Open Wounding Event: Gradually Appeared Status: Date Acquired: 02/28/2023 Comorbid Arrhythmia, Congestive Heart Failure,  Hypertension, Peripheral Weeks Of Treatment: 10 History: Venous Disease, Type II Diabetes Clustered Wound: No Photos Wound Measurements Length: (cm) 0.1 Width: (cm) 0.1 Depth: (cm) 0.1 Area: (cm) 0. Volume: (cm) 0. MARQUIZE, GRUVER ([id number]) Wound Description Classification: Grade 2 Exudate Amount: Medium Exudate Type: Serosanguineous Exudate Color: red, brown Foul Odor After Cleansing: Slough/Fibrino % Reduction in Area: 99.8% % Reduction in Volume: 99.9% Epithelialization: Large (67-100%) 008 Tunneling: No 001 Undermining: No [id number].pdf Page 9 of 11 No Yes Wound Bed Granulation Amount: None Present (0%) Exposed Structure Necrotic Amount: Large (67-100%) Fascia Exposed: No Necrotic Quality: Eschar Fat Layer (Subcutaneous Tissue) Exposed: Yes Tendon Exposed: No Muscle Exposed: No Joint Exposed: No Bone Exposed: No Treatment Notes Wound #1 (Lower Leg) Wound Laterality: Left, Medial Cleanser Soap and Water Discharge Instruction: Gently cleanse wound with antibacterial soap, rinse and pat dry prior to dressing wounds Wound Cleanser Discharge Instruction: Wash your hands with soap and water. Remove old dressing, discard into plastic bag and place into trash. Cleanse the wound with Wound Cleanser prior to applying a clean dressing using gauze sponges, not tissues or cotton balls. Do not scrub or use excessive force. Pat dry using gauze sponges, not tissue or cotton balls. Peri-Wound Care Topical Primary Dressing Hydrofera Blue Ready Transfer Foam, 2.5x2.5 (in/in) Discharge Instruction: Apply Hydrofera Blue Ready to wound bed as directed Secondary Dressing ABD Pad 5x9 (in/in) Discharge Instruction: Cover with ABD pad Secured With Compression Wrap Urgo K2, two layer compression system, large Compression Stockings Add-Ons Electronic Signature(s) Signed: 06/28/2023 3:20:47 PM By: Yevonne Pax RN Entered By: Yevonne Pax on 06/28/2023  08:15:38 -------------------------------------------------------------------------------- Wound Assessment Details Patient Name: Date of Service: Ryan Elm RLES A. 06/28/2023 8:00 A M Medical Record Number: [id number] Patient Account Number: [id number] Date of Birth/Sex: Treating RN: 01/23/1971 (52 y.o. [name] Primary Care Dannia Snook: [name] Other Clinician: Referring Emmi Wertheim: Treating Kyston Gonce/Extender: [name] in Treatment: 10 Wound Status Tullos, Maricus A ([id number]) [phone].pdf Page 10 of 11 Wound Number: 3 Primary Diabetic Wound/Ulcer of the Lower Extremity  Ryan [name] Other Clinician: Referring Marcel Sorter: Treating Ralphael Southgate/Extender: [name] in Treatment:  10 Active Problems Location of Pain Severity and Description of Pain Patient Has Paino No Site Locations Pain Management and Medication Current Pain Management: Electronic Signature(s) Signed: 06/28/2023 3:20:47 PM By: Yevonne Pax RN Entered By: Yevonne Pax on 06/28/2023 08:13:04 -------------------------------------------------------------------------------- Patient/Caregiver Education Details Patient Name: Date of Service: Ryan Jaeger A. 10/29/2024andnbsp8:00 A M Medical Record Number: [id number] Patient Account Number: [id number] NESSIAH, PHA ([id number]) [id number].pdf Page 8 of 11 Date of Birth/Gender: Treating RN: December 24, 1970 (52 y.o. Ryan [name]) Yevonne Pax Primary Care Physician: [name] Other Clinician: Referring Physician: Treating Physician/Extender: [name] in Treatment: 10 Education Assessment Education Provided To: Patient Education Topics Provided Wound/Skin Impairment: Handouts: Caring for Your Ulcer Methods: Explain/Verbal Responses: State content correctly Electronic Signature(s) Signed: 06/28/2023 3:20:47 PM By: Yevonne Pax RN Entered By: Yevonne Pax on 06/28/2023 08:20:03 -------------------------------------------------------------------------------- Wound Assessment Details Patient Name: Date of Service: Ryan Elm RLES A. 06/28/2023 8:00 A M Medical Record Number: [id number] Patient Account Number: [id number] Date of Birth/Sex: Treating RN: [birth year] (52 y.o. Ryan [name]) Yevonne Pax Primary Care Anaysia Germer: [name] Other Clinician: Referring Yaris Ferrell: Treating Jeremie Giangrande/Extender: [name] in Treatment: 10 Wound Status Wound Number: 1 Primary Diabetic Wound/Ulcer of the Lower Extremity Etiology: Wound Location: Left, Medial Lower Leg Wound Open Wounding Event: Gradually Appeared Status: Date Acquired: 02/28/2023 Comorbid Arrhythmia, Congestive Heart Failure,  Hypertension, Peripheral Weeks Of Treatment: 10 History: Venous Disease, Type II Diabetes Clustered Wound: No Photos Wound Measurements Length: (cm) 0.1 Width: (cm) 0.1 Depth: (cm) 0.1 Area: (cm) 0. Volume: (cm) 0. MARQUIZE, GRUVER ([id number]) Wound Description Classification: Grade 2 Exudate Amount: Medium Exudate Type: Serosanguineous Exudate Color: red, brown Foul Odor After Cleansing: Slough/Fibrino % Reduction in Area: 99.8% % Reduction in Volume: 99.9% Epithelialization: Large (67-100%) 008 Tunneling: No 001 Undermining: No [id number].pdf Page 9 of 11 No Yes Wound Bed Granulation Amount: None Present (0%) Exposed Structure Necrotic Amount: Large (67-100%) Fascia Exposed: No Necrotic Quality: Eschar Fat Layer (Subcutaneous Tissue) Exposed: Yes Tendon Exposed: No Muscle Exposed: No Joint Exposed: No Bone Exposed: No Treatment Notes Wound #1 (Lower Leg) Wound Laterality: Left, Medial Cleanser Soap and Water Discharge Instruction: Gently cleanse wound with antibacterial soap, rinse and pat dry prior to dressing wounds Wound Cleanser Discharge Instruction: Wash your hands with soap and water. Remove old dressing, discard into plastic bag and place into trash. Cleanse the wound with Wound Cleanser prior to applying a clean dressing using gauze sponges, not tissues or cotton balls. Do not scrub or use excessive force. Pat dry using gauze sponges, not tissue or cotton balls. Peri-Wound Care Topical Primary Dressing Hydrofera Blue Ready Transfer Foam, 2.5x2.5 (in/in) Discharge Instruction: Apply Hydrofera Blue Ready to wound bed as directed Secondary Dressing ABD Pad 5x9 (in/in) Discharge Instruction: Cover with ABD pad Secured With Compression Wrap Urgo K2, two layer compression system, large Compression Stockings Add-Ons Electronic Signature(s) Signed: 06/28/2023 3:20:47 PM By: Yevonne Pax RN Entered By: Yevonne Pax on 06/28/2023  08:15:38 -------------------------------------------------------------------------------- Wound Assessment Details Patient Name: Date of Service: Ryan Elm RLES A. 06/28/2023 8:00 A M Medical Record Number: [id number] Patient Account Number: [id number] Date of Birth/Sex: Treating RN: 01/23/1971 (52 y.o. [name] Primary Care Dannia Snook: [name] Other Clinician: Referring Emmi Wertheim: Treating Kyston Gonce/Extender: [name] in Treatment: 10 Wound Status Tullos, Maricus A ([id number]) [phone].pdf Page 10 of 11 Wound Number: 3 Primary Diabetic Wound/Ulcer of the Lower Extremity

## 2023-06-28 NOTE — Progress Notes (Signed)
LENVILLE, RINKE A ([id number]) [id number] Page 1 of 8 Visit Report for 06/28/2023 Chief Complaint Document Details Patient Name: Date of Service: KELVION, SOTIROPOULOS RLES A. 06/28/2023 8:00 A M Medical Record Number: [id number] Patient Account Number: [id number] Date of Birth/Sex: Treating RN: 08-01-1971 (52 y.o. [name]) Yevonne Pax Primary Care Provider: [name] Other Clinician: Referring Provider: Treating Provider/Extender: [name] in Treatment: 10 Information Obtained from: Patient Chief Complaint Left leg and left foot ulcers Electronic Signature(s) Signed: 06/28/2023 8:30:48 AM By: [name] PA-C Entered By: [name] on 06/28/2023 08:30:48 -------------------------------------------------------------------------------- HPI Details Patient Name: Date of Service: [name] 06/28/2023 8:00 A M Medical Record Number: [id number] Patient Account Number: [id number] Date of Birth/Sex: Treating RN: 06/16/1971 (52 y.o. [name] Primary Care Provider: [name] Other Clinician: Referring Provider: Treating Provider/Extender: [name] in Treatment: 10 History of Present Illness Chronic/Inactive Conditions Condition 1: 04-18-2023 patient screening ABI today was 0.83 and this was on the left. With that being said he had previous ABIs done formally on 06-25-2021 with a right ABI of 1.12 and a TBI of 1.03 on the left she had an ABI of 1.12 with a TBI of 0.98. HPI Description: 04-18-2023 upon evaluation today patient presents for initial inspection here in our clinic some concerning issues he is having with his left proximal foot as well as his left lower extremity. This actually appears to be more of a lymphedema type situation to be honest. He does have chronic lower extremity swelling secondary to what appears to be venous stasis he has not really had any wounds significantly before and tells  me that it has been a number of years probably even 12 or so since he has last used his lymphedema pumps. He is not even sure if they are actually functioning anymore or not. With that being said this is 1 thing that I did want him to look into. He is on a fluid pill and he does not wear compression socks on a regular basis although I definitely think that something that he also really needs to be doing to be honest. He does not have any injury that occurred to cause these wounds they more or less spontaneously occurred as a result of what appears to be excessive swelling. Patient does have a history of diabetes mellitus type 2, congestive heart failure, lymphedema, venous hypertension, hypertension, peripheral vascular disease, and chronic kidney disease stage III. 8/26; this is a patient with chronic lymphedema. He has wounds on the left medial lower extremity and left medial foot. We have been using Hydrofera Blue and Urgo K2. He tells me he has a compression pump at home although it is missing apart and he is not really familiar with with which company to call. He also started on furosemide 20 mg every other day probably would do better with every day and he is talking to his physician about this. [name], BAMBERGER A ([id number]) [id number] Page 2 of 8 05-05-2023 upon evaluation today patient appears to be doing pretty well currently in regard to his wound. Fortunately there does not appear to be any signs of infection he does have some slough and biofilm noted I did discuss with the patient today I do believe that he would benefit from a continuation of therapy with regard to his wound. I think we may need to increase the compression however and also think that he may benefit from targeted debridement today. 05-09-2023 upon evaluation today patient  LENVILLE, RINKE A ([id number]) [id number] Page 1 of 8 Visit Report for 06/28/2023 Chief Complaint Document Details Patient Name: Date of Service: KELVION, SOTIROPOULOS RLES A. 06/28/2023 8:00 A M Medical Record Number: [id number] Patient Account Number: [id number] Date of Birth/Sex: Treating RN: 08-01-1971 (52 y.o. [name]) Yevonne Pax Primary Care Provider: [name] Other Clinician: Referring Provider: Treating Provider/Extender: [name] in Treatment: 10 Information Obtained from: Patient Chief Complaint Left leg and left foot ulcers Electronic Signature(s) Signed: 06/28/2023 8:30:48 AM By: [name] PA-C Entered By: [name] on 06/28/2023 08:30:48 -------------------------------------------------------------------------------- HPI Details Patient Name: Date of Service: [name] 06/28/2023 8:00 A M Medical Record Number: [id number] Patient Account Number: [id number] Date of Birth/Sex: Treating RN: 06/16/1971 (52 y.o. [name] Primary Care Provider: [name] Other Clinician: Referring Provider: Treating Provider/Extender: [name] in Treatment: 10 History of Present Illness Chronic/Inactive Conditions Condition 1: 04-18-2023 patient screening ABI today was 0.83 and this was on the left. With that being said he had previous ABIs done formally on 06-25-2021 with a right ABI of 1.12 and a TBI of 1.03 on the left she had an ABI of 1.12 with a TBI of 0.98. HPI Description: 04-18-2023 upon evaluation today patient presents for initial inspection here in our clinic some concerning issues he is having with his left proximal foot as well as his left lower extremity. This actually appears to be more of a lymphedema type situation to be honest. He does have chronic lower extremity swelling secondary to what appears to be venous stasis he has not really had any wounds significantly before and tells  me that it has been a number of years probably even 12 or so since he has last used his lymphedema pumps. He is not even sure if they are actually functioning anymore or not. With that being said this is 1 thing that I did want him to look into. He is on a fluid pill and he does not wear compression socks on a regular basis although I definitely think that something that he also really needs to be doing to be honest. He does not have any injury that occurred to cause these wounds they more or less spontaneously occurred as a result of what appears to be excessive swelling. Patient does have a history of diabetes mellitus type 2, congestive heart failure, lymphedema, venous hypertension, hypertension, peripheral vascular disease, and chronic kidney disease stage III. 8/26; this is a patient with chronic lymphedema. He has wounds on the left medial lower extremity and left medial foot. We have been using Hydrofera Blue and Urgo K2. He tells me he has a compression pump at home although it is missing apart and he is not really familiar with with which company to call. He also started on furosemide 20 mg every other day probably would do better with every day and he is talking to his physician about this. [name], BAMBERGER A ([id number]) [id number] Page 2 of 8 05-05-2023 upon evaluation today patient appears to be doing pretty well currently in regard to his wound. Fortunately there does not appear to be any signs of infection he does have some slough and biofilm noted I did discuss with the patient today I do believe that he would benefit from a continuation of therapy with regard to his wound. I think we may need to increase the compression however and also think that he may benefit from targeted debridement today. 05-09-2023 upon evaluation today patient  continue to utilize the compression wraps which do seem to be helping him. Will get a go ahead and get that back on try to get his legs healed and then get him into his compression socks. 2. I would recommend as well that we continue with the Hydrofera Blue ABD pads and the Urgo K2 compression wrap. 3. With regard to his breathing and low oxygen saturations bouncing between 88 and 90 even when he sitting not even talking I think he needs to get in touch with his pulmonologist and see if they would recommend going forward with getting him on oxygen during the day as well at least a low level. The patient voiced understanding and he states that something that he will do I recommended he call them today. We will see patient back for reevaluation in 1 week here in the clinic. If anything worsens or changes patient will contact our office for additional recommendations. Electronic Signature(s) Signed: 06/28/2023 10:10:47 AM By: [name] PA-C Entered By: [name] on 06/28/2023 10:10:47 -------------------------------------------------------------------------------- SuperBill Details Patient  Name: Date of Service: [name] 06/28/2023 Medical Record Number: [id number] Patient Account Number: [id number] Date of Birth/Sex: Treating RN: 1971-07-30 (52 y.o. [name] Primary Care Provider: [name] Other Clinician: Referring Provider: Treating Provider/Extender: [name] in Treatment: 10 Diagnosis Coding ICD-10 Codes Code Description E11.622 Type 2 diabetes mellitus with other skin ulcer L97.822 Non-pressure chronic ulcer of other part of left lower leg with fat layer exposed Randa, Tyreon A ([id number]) [id number] Page 8 of 8 E11.621 Type 2 diabetes mellitus with foot ulcer L97.522 Non-pressure chronic ulcer of other part of left foot with fat layer exposed I50.42 Chronic combined systolic (congestive) and diastolic (congestive) heart failure I89.0 Lymphedema, not elsewhere classified 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 I10 Essential (primary) hypertension I73.89 Other specified peripheral vascular diseases N18.30 Chronic kidney disease, stage 3 unspecified Facility Procedures : CPT4: Code [id number] Description: 81 BILATERAL: Application of multi-layer venous compression system; leg (below knee), including ankle and t. Modifier: Quantity: 1 Physician Procedures : CPT4 Code Description Modifier [id number] - WC PHYS LEVEL 3 - EST PT ICD-10 Diagnosis Description E11.622 Type 2 diabetes mellitus with other skin ulcer L97.822 Non-pressure chronic ulcer of other part of left lower leg with fat layer exposed  E11.621 Type 2 diabetes mellitus with foot ulcer L97.522 Non-pressure chronic ulcer of other part of left foot with fat layer exposed Quantity: 1 Electronic Signature(s) Signed: 06/28/2023 3:28:05 PM By: Yevonne Pax RN Signed: 06/28/2023 6:10:41 PM By: [name] PA-C Previous Signature: 06/28/2023 10:11:17 AM Version By:  [name] PA-C Entered By: Yevonne Pax on 06/28/2023 15:28:05  Again this is the biggest concern that I have at this point. I do believe that he is going to have to go back into compression to get these under control and that he is gena need ongoing compression stockings. 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 His oxygen saturation remains low even today in the clinic with just sitting and not even talking he was bouncing between 88 and 90 I am not so sure he should not have oxygen 24/7 at least at low levels. Electronic Signature(s) Signed: 06/28/2023 10:09:40 AM By: [name] PA-C Entered By: [name] on 06/28/2023 10:09:40 -------------------------------------------------------------------------------- Physical Exam Details Patient Name: Date of Service: [name] RLES  A. 06/28/2023 8:00 A M Medical Record Number: [id number] Patient Account Number: [id number] Date of Birth/Sex: Treating RN: 17-Jul-1971 (52 y.o. [name] Primary Care Provider: [name] Other Clinician: Referring Provider: Treating Provider/Extender: [name] in Treatment: 10 Constitutional Well-nourished and well-hydrated in no acute distress. Respiratory normal breathing without difficulty. Psychiatric this patient is able to make decisions and demonstrates good insight into disease process. Alert and Oriented x 3. pleasant and cooperative. Notes Upon inspection patient's wound bed actually showed signs of good granulation epithelization at this point. Fortunately I do not see any signs of worsening overall I do believe that the patient is making good headway here towards closure and very pleased in that regard. Electronic Signature(s) Signed: 06/28/2023 10:09:57 AM By: Wonda Olds, Bing Neighbors ([id number]) [id number] Page 3 of 8 Entered By: [name] on 06/28/2023 10:09:56 -------------------------------------------------------------------------------- Physician Orders Details Patient Name: Date of Service: AKON, BERTANI A. 06/28/2023 8:00 A M Medical Record Number: [id number] Patient Account Number: [id number] Date of Birth/Sex: Treating RN: Nov 01, 1970 (52 y.o. [name]) Yevonne Pax Primary Care Provider: [name] Other Clinician: Referring Provider: Treating Provider/Extender: [name] in Treatment: 10 The following information was scribed by: Yevonne Pax The information was scribed for: [name] Verbal / Phone Orders: No Diagnosis Coding ICD-10 Coding Code Description E11.622 Type 2 diabetes mellitus with other skin ulcer L97.822 Non-pressure chronic ulcer of other part of left lower leg with fat layer exposed E11.621 Type 2 diabetes mellitus with foot  ulcer L97.522 Non-pressure chronic ulcer of other part of left foot with fat layer exposed I50.42 Chronic combined systolic (congestive) and diastolic (congestive) heart failure I89.0 Lymphedema, not elsewhere classified 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 Bathing/ Shower/ Hygiene May shower with wound dressing protected with water repellent cover or cast protector. Anesthetic (Use 'Patient Medications' Section for Anesthetic Order Entry) Lidocaine applied to wound bed Wound Treatment Wound #1 - Lower Leg Wound Laterality: Left, Medial Cleanser: Soap and Water 1 x Per Week/30 Days Discharge Instructions: Gently cleanse wound with antibacterial soap, rinse and pat dry prior to dressing wounds Cleanser: Wound Cleanser 1 x Per Week/30 Days Discharge Instructions: Wash your hands with soap and water. Remove old dressing, discard into plastic bag and place into trash. Cleanse the wound with Wound Cleanser prior to applying a clean dressing using gauze sponges, not tissues or cotton balls. Do not scrub or use excessive force. Pat dry using gauze sponges, not tissue or cotton balls. Prim Dressing: Hydrofera Blue Ready Transfer Foam, 2.5x2.5 (in/in) 1 x Per Week/30 Days ary Discharge Instructions: Apply Hydrofera Blue Ready to wound  LENVILLE, RINKE A ([id number]) [id number] Page 1 of 8 Visit Report for 06/28/2023 Chief Complaint Document Details Patient Name: Date of Service: KELVION, SOTIROPOULOS RLES A. 06/28/2023 8:00 A M Medical Record Number: [id number] Patient Account Number: [id number] Date of Birth/Sex: Treating RN: 08-01-1971 (52 y.o. [name]) Yevonne Pax Primary Care Provider: [name] Other Clinician: Referring Provider: Treating Provider/Extender: [name] in Treatment: 10 Information Obtained from: Patient Chief Complaint Left leg and left foot ulcers Electronic Signature(s) Signed: 06/28/2023 8:30:48 AM By: [name] PA-C Entered By: [name] on 06/28/2023 08:30:48 -------------------------------------------------------------------------------- HPI Details Patient Name: Date of Service: [name] 06/28/2023 8:00 A M Medical Record Number: [id number] Patient Account Number: [id number] Date of Birth/Sex: Treating RN: 06/16/1971 (52 y.o. [name] Primary Care Provider: [name] Other Clinician: Referring Provider: Treating Provider/Extender: [name] in Treatment: 10 History of Present Illness Chronic/Inactive Conditions Condition 1: 04-18-2023 patient screening ABI today was 0.83 and this was on the left. With that being said he had previous ABIs done formally on 06-25-2021 with a right ABI of 1.12 and a TBI of 1.03 on the left she had an ABI of 1.12 with a TBI of 0.98. HPI Description: 04-18-2023 upon evaluation today patient presents for initial inspection here in our clinic some concerning issues he is having with his left proximal foot as well as his left lower extremity. This actually appears to be more of a lymphedema type situation to be honest. He does have chronic lower extremity swelling secondary to what appears to be venous stasis he has not really had any wounds significantly before and tells  me that it has been a number of years probably even 12 or so since he has last used his lymphedema pumps. He is not even sure if they are actually functioning anymore or not. With that being said this is 1 thing that I did want him to look into. He is on a fluid pill and he does not wear compression socks on a regular basis although I definitely think that something that he also really needs to be doing to be honest. He does not have any injury that occurred to cause these wounds they more or less spontaneously occurred as a result of what appears to be excessive swelling. Patient does have a history of diabetes mellitus type 2, congestive heart failure, lymphedema, venous hypertension, hypertension, peripheral vascular disease, and chronic kidney disease stage III. 8/26; this is a patient with chronic lymphedema. He has wounds on the left medial lower extremity and left medial foot. We have been using Hydrofera Blue and Urgo K2. He tells me he has a compression pump at home although it is missing apart and he is not really familiar with with which company to call. He also started on furosemide 20 mg every other day probably would do better with every day and he is talking to his physician about this. [name], BAMBERGER A ([id number]) [id number] Page 2 of 8 05-05-2023 upon evaluation today patient appears to be doing pretty well currently in regard to his wound. Fortunately there does not appear to be any signs of infection he does have some slough and biofilm noted I did discuss with the patient today I do believe that he would benefit from a continuation of therapy with regard to his wound. I think we may need to increase the compression however and also think that he may benefit from targeted debridement today. 05-09-2023 upon evaluation today patient  LENVILLE, RINKE A ([id number]) [id number] Page 1 of 8 Visit Report for 06/28/2023 Chief Complaint Document Details Patient Name: Date of Service: KELVION, SOTIROPOULOS RLES A. 06/28/2023 8:00 A M Medical Record Number: [id number] Patient Account Number: [id number] Date of Birth/Sex: Treating RN: 08-01-1971 (52 y.o. [name]) Yevonne Pax Primary Care Provider: [name] Other Clinician: Referring Provider: Treating Provider/Extender: [name] in Treatment: 10 Information Obtained from: Patient Chief Complaint Left leg and left foot ulcers Electronic Signature(s) Signed: 06/28/2023 8:30:48 AM By: [name] PA-C Entered By: [name] on 06/28/2023 08:30:48 -------------------------------------------------------------------------------- HPI Details Patient Name: Date of Service: [name] 06/28/2023 8:00 A M Medical Record Number: [id number] Patient Account Number: [id number] Date of Birth/Sex: Treating RN: 06/16/1971 (52 y.o. [name] Primary Care Provider: [name] Other Clinician: Referring Provider: Treating Provider/Extender: [name] in Treatment: 10 History of Present Illness Chronic/Inactive Conditions Condition 1: 04-18-2023 patient screening ABI today was 0.83 and this was on the left. With that being said he had previous ABIs done formally on 06-25-2021 with a right ABI of 1.12 and a TBI of 1.03 on the left she had an ABI of 1.12 with a TBI of 0.98. HPI Description: 04-18-2023 upon evaluation today patient presents for initial inspection here in our clinic some concerning issues he is having with his left proximal foot as well as his left lower extremity. This actually appears to be more of a lymphedema type situation to be honest. He does have chronic lower extremity swelling secondary to what appears to be venous stasis he has not really had any wounds significantly before and tells  me that it has been a number of years probably even 12 or so since he has last used his lymphedema pumps. He is not even sure if they are actually functioning anymore or not. With that being said this is 1 thing that I did want him to look into. He is on a fluid pill and he does not wear compression socks on a regular basis although I definitely think that something that he also really needs to be doing to be honest. He does not have any injury that occurred to cause these wounds they more or less spontaneously occurred as a result of what appears to be excessive swelling. Patient does have a history of diabetes mellitus type 2, congestive heart failure, lymphedema, venous hypertension, hypertension, peripheral vascular disease, and chronic kidney disease stage III. 8/26; this is a patient with chronic lymphedema. He has wounds on the left medial lower extremity and left medial foot. We have been using Hydrofera Blue and Urgo K2. He tells me he has a compression pump at home although it is missing apart and he is not really familiar with with which company to call. He also started on furosemide 20 mg every other day probably would do better with every day and he is talking to his physician about this. [name], BAMBERGER A ([id number]) [id number] Page 2 of 8 05-05-2023 upon evaluation today patient appears to be doing pretty well currently in regard to his wound. Fortunately there does not appear to be any signs of infection he does have some slough and biofilm noted I did discuss with the patient today I do believe that he would benefit from a continuation of therapy with regard to his wound. I think we may need to increase the compression however and also think that he may benefit from targeted debridement today. 05-09-2023 upon evaluation today patient  bed as directed Secondary Dressing: ABD Pad 5x9 (in/in) 1 x Per Week/30 Days Discharge Instructions: Cover with ABD pad Compression Wrap: Urgo K2, two layer compression system, large 1 x Per Week/30 Days Wound #3 - Lower Leg Wound Laterality: Right, Anterior Cleanser: Soap and Water 1 x Per Week/30 Days Discharge Instructions: Gently cleanse wound with antibacterial soap, rinse and pat dry prior to dressing  wounds Mcauley, Caydan A ([id number]) [id number] Page 4 of 8 Cleanser: Wound Cleanser 1 x Per Week/30 Days Discharge Instructions: Wash your hands with soap and water. Remove old dressing, discard into plastic bag and place into trash. Cleanse the wound with Wound Cleanser prior to applying a clean dressing using gauze sponges, not tissues or cotton balls. Do not scrub or use excessive force. Pat dry using gauze sponges, not tissue or cotton balls. Prim Dressing: Hydrofera Blue Ready Transfer Foam, 2.5x2.5 (in/in) 1 x Per Week/30 Days ary Discharge Instructions: Apply Hydrofera Blue Ready to wound bed as directed Secondary Dressing: ABD Pad 5x9 (in/in) 1 x Per Week/30 Days Discharge Instructions: Cover with ABD pad Compression Wrap: Urgo K2, two layer compression system, large 1 x Per Week/30 Days Electronic Signature(s) Signed: 06/28/2023 3:20:47 PM By: Yevonne Pax RN Signed: 06/28/2023 6:10:41 PM By: [name] PA-C Entered By: Yevonne Pax on 06/28/2023 11:56:22 -------------------------------------------------------------------------------- Problem List Details Patient Name: Date of Service: [name] 06/28/2023 8:00 A M Medical Record Number: [id number] Patient Account Number: [id number] Date of Birth/Sex: Treating RN: 1971/04/23 (52 y.o. 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/19/2024 No Yes E11.621 Type 2 diabetes mellitus with foot ulcer 04/18/2023 No Yes L97.522 Non-pressure chronic ulcer of other part of left foot with fat layer exposed 04/18/2023 No Yes I50.42 Chronic  combined systolic (congestive) and diastolic (congestive) heart failure 04/18/2023 No Yes I89.0 Lymphedema, not elsewhere classified 04/18/2023 No Yes I87.332 Chronic venous hypertension (idiopathic) with ulcer and inflammation of left 04/18/2023 No Yes lower extremity Toothaker, Sricharan A ([id number]) [id number] Page 5 of 8 I10 Essential (primary) hypertension 04/18/2023 No Yes I73.89 Other specified peripheral vascular diseases 04/18/2023 No Yes N18.30 Chronic kidney disease, stage 3 unspecified 04/18/2023 No Yes Inactive Problems Resolved Problems Electronic Signature(s) Signed: 06/28/2023 8:30:38 AM By: [name] PA-C Entered By: [name] on 06/28/2023 08:30:37 -------------------------------------------------------------------------------- Progress Note Details Patient Name: Date of Service: [name] 06/28/2023 8:00 A M Medical Record Number: [id number] Patient Account Number: [id number] Date of Birth/Sex: Treating RN: 07/24/1971 (52 y.o. [name] Primary Care Provider: [name] Other Clinician: Referring Provider: Treating Provider/Extender: [name] in Treatment: 10 Subjective Chief Complaint Information obtained from Patient Left leg and left foot ulcers History of Present Illness (HPI) Chronic/Inactive Condition: 04-18-2023 patient screening ABI today was 0.83 and this was on the left. With that being said he had previous ABIs done formally on 06-25-2021 with a right ABI of 1.12 and a TBI of 1.03 on the left she had an ABI of 1.12 with a TBI of 0.98. 04-18-2023 upon evaluation today patient presents for initial inspection here in our clinic some concerning issues he is having with his left proximal foot as well as his left lower extremity. This actually appears  LENVILLE, RINKE A ([id number]) [id number] Page 1 of 8 Visit Report for 06/28/2023 Chief Complaint Document Details Patient Name: Date of Service: KELVION, SOTIROPOULOS RLES A. 06/28/2023 8:00 A M Medical Record Number: [id number] Patient Account Number: [id number] Date of Birth/Sex: Treating RN: 08-01-1971 (52 y.o. [name]) Yevonne Pax Primary Care Provider: [name] Other Clinician: Referring Provider: Treating Provider/Extender: [name] in Treatment: 10 Information Obtained from: Patient Chief Complaint Left leg and left foot ulcers Electronic Signature(s) Signed: 06/28/2023 8:30:48 AM By: [name] PA-C Entered By: [name] on 06/28/2023 08:30:48 -------------------------------------------------------------------------------- HPI Details Patient Name: Date of Service: [name] 06/28/2023 8:00 A M Medical Record Number: [id number] Patient Account Number: [id number] Date of Birth/Sex: Treating RN: 06/16/1971 (52 y.o. [name] Primary Care Provider: [name] Other Clinician: Referring Provider: Treating Provider/Extender: [name] in Treatment: 10 History of Present Illness Chronic/Inactive Conditions Condition 1: 04-18-2023 patient screening ABI today was 0.83 and this was on the left. With that being said he had previous ABIs done formally on 06-25-2021 with a right ABI of 1.12 and a TBI of 1.03 on the left she had an ABI of 1.12 with a TBI of 0.98. HPI Description: 04-18-2023 upon evaluation today patient presents for initial inspection here in our clinic some concerning issues he is having with his left proximal foot as well as his left lower extremity. This actually appears to be more of a lymphedema type situation to be honest. He does have chronic lower extremity swelling secondary to what appears to be venous stasis he has not really had any wounds significantly before and tells  me that it has been a number of years probably even 12 or so since he has last used his lymphedema pumps. He is not even sure if they are actually functioning anymore or not. With that being said this is 1 thing that I did want him to look into. He is on a fluid pill and he does not wear compression socks on a regular basis although I definitely think that something that he also really needs to be doing to be honest. He does not have any injury that occurred to cause these wounds they more or less spontaneously occurred as a result of what appears to be excessive swelling. Patient does have a history of diabetes mellitus type 2, congestive heart failure, lymphedema, venous hypertension, hypertension, peripheral vascular disease, and chronic kidney disease stage III. 8/26; this is a patient with chronic lymphedema. He has wounds on the left medial lower extremity and left medial foot. We have been using Hydrofera Blue and Urgo K2. He tells me he has a compression pump at home although it is missing apart and he is not really familiar with with which company to call. He also started on furosemide 20 mg every other day probably would do better with every day and he is talking to his physician about this. [name], BAMBERGER A ([id number]) [id number] Page 2 of 8 05-05-2023 upon evaluation today patient appears to be doing pretty well currently in regard to his wound. Fortunately there does not appear to be any signs of infection he does have some slough and biofilm noted I did discuss with the patient today I do believe that he would benefit from a continuation of therapy with regard to his wound. I think we may need to increase the compression however and also think that he may benefit from targeted debridement today. 05-09-2023 upon evaluation today patient

## 2023-06-28 NOTE — Telephone Encounter (Signed)
Lpm to see if he had lab drawn in [facility] today 06/28/23

## 2023-06-30 ENCOUNTER — Ambulatory Visit: Payer: No Typology Code available for payment source | Admitting: Physician Assistant

## 2023-06-30 ENCOUNTER — Telehealth: Payer: Self-pay | Admitting: *Deleted

## 2023-06-30 NOTE — Telephone Encounter (Signed)
Called pt since INR lab is overdue. Spoke with him and he states he did not feel like going on yesterday to have labs drawn and that he will be able to go tomorrow around 9am.   Educated pt on the importance of having this INR/warfarin managed appropriately to avoid the risk of clot or stroke. He states he is aware of clots and been dealing with them all his life so he is aware.   Inquired about who has been monitoring him prior to being referred to [place] and  he states his PCP. He states it is much easier to get in and out of there office at his PCP but they charge $40 a visit. He stated he was willing to come to [facility] office since discussed with the MD's in the hospital. Asked pt if he know what his co-pays are and he stated he has paid up for the year and it won't be an issue at this time. Advised that he needs to be sure since co-pays are relevant and don't want him to be caught by surprise.  Lastly, advised that it is important we get his INR soon and he states he will go tomorrow morning since someone can drop him off near the door to get in and out. Advised someone may call him tomorrow to ensure he has gone or after labs received and he verbalized understanding.   Pt is seeing Dr. [name] at [facility] on 07/11/23 Dx-Afib  Previously managed by his PCP

## 2023-07-03 NOTE — Progress Notes (Unsigned)
There were no vitals taken for this visit.   Subjective:    Patient ID: Ryan [name], male    DOB: 13-Feb-1971, 52 y.o.   MRN: [id number]  HPI: Ryan [name] is a 52 y.o. male  No chief complaint on file.  Transition of Care Hospital Follow up.   Hospital/Facility: [facility] D/C Physician:  D/C Date:   Records Requested:  Records Received:  Records Reviewed:   Diagnoses on Discharge:   Acute on chronic respiratory failure with hypoxia (HCC) Decompensated respiratory status now requiring 2 to 3 L nasal cannula in the setting of concurrent acute on chronic PE with heart strain, acute on chronic HFpEF, morbid obesity, sepsis secondary to left lower extremity wound Noted baseline 2 L nasal cannula liters transiently used at night Multifactorial in the setting of morbid obesity, PE with heart strain and decompensated heart failure    Pulmonary emboli (HCC) Recurrent PE with noted progressive chest pain shortness of breath orthopnea Status post IVC x 2 CT of the chest with acute on chronic PE On Coumadin with INR subtherapeutic at 1.6 Case discussed with vascular surgery-tentatively not a surgical candidate for thrombectomy at present Follow-up vascular surgery    Acute on chronic HFrEF (heart failure with reduced ejection fraction) (HCC) 2D echo 05/20/2023 with a EF of 60 to 65% Positive orthopnea, chest pain with noted concurrent acute on chronic PE as well as heart strain on imaging BNP 850s Marked generalized edema in setting of morbid obesity Gentle diuresis in setting of sepsis    Severe sepsis (HCC) Meeting severe sepsis criteria with temperature of 101, respirations in the upper 20s, lactate noted be greater than 2 White count 10.7 Concern for acute on chronic lower extremity wound with new ulcer formation and foul-smelling odor Followed by Wound Care  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  Hospital questions compliance with  coumadin.  INR subtherapeutic at 1.6 on admission  On Coumadin secondary to body habitus     Date of interactive Contact within 48 hours of discharge:  Contact was through: {Blank single:19197::"phone","e-mail","direct","other"}  Date of 7 day or 14 day face-to-face visit:    {Blank single:19197::"within 7 days","within 14 days"}  Outpatient Encounter Medications as of 07/04/2023  Medication Sig   albuterol (VENTOLIN HFA) 108 (90 Base) MCG/ACT inhaler Inhale 2 puffs into the lungs every 6 (six) hours as needed for wheezing or shortness of breath.   ascorbic acid (VITAMIN C) 500 MG tablet Take 1 tablet (500 mg total) by mouth 2 (two) times daily.   cephALEXin (KEFLEX) 500 MG capsule Take 500 mg by mouth 3 (three) times daily. For three days   digoxin 62.5 MCG TABS Take 0.0625 mg by mouth daily.   furosemide (LASIX) 40 MG tablet Take 1 tablet (40 mg total) by mouth every other day.   losartan (COZAAR) 25 MG tablet Take 1 tablet (25 mg total) by mouth daily.   metoprolol succinate (TOPROL-XL) 50 MG 24 hr tablet Take 1 tablet (50 mg total) by mouth daily. Take with or immediately following a meal.   Multiple Vitamin (MULTIVITAMIN WITH MINERALS) TABS tablet Take 1 tablet by mouth daily.   neomycin-bacitracin-polymyxin [phone] OINT Apply 1 Application topically 3 (three) times daily.   OXYGEN Inhale 1 L into the lungs daily.   spironolactone (ALDACTONE) 25 MG tablet Take 1 tablet (25 mg total) by mouth daily.   warfarin (COUMADIN) 5 MG tablet Take 1 tablet (5 mg total) by mouth  daily.   No facility-administered encounter medications on file as of 07/04/2023.    Diagnostic Tests Reviewed/Disposition:   Consults:  Discharge Instructions  Disease/illness Education:  Home Health/Community Services Discussions/Referrals:  Establishment or re-establishment of referral orders for community resources:  Discussion with other health care providers:  Assessment and Support of treatment  regimen adherence:  Appointments Coordinated with:   Education for self-management, independent living, and ADLs:   Relevant past medical, surgical, family and social history reviewed and updated as indicated. Interim medical history since our last visit reviewed. Allergies and medications reviewed and updated.  Review of Systems  Per HPI unless specifically indicated above     Objective:    There were no vitals taken for this visit.  Wt Readings from Last 3 Encounters:  06/23/23 (!) 333 lb 8.9 oz (151.3 kg)  06/02/23 (!) 342 lb (155.1 kg)  05/31/23 (!) 343 lb 3.2 oz (155.7 kg)    Physical Exam  Results for orders placed or performed during the hospital encounter of 06/14/23  Resp panel by RT-PCR (RSV, Flu A&B, Covid) Anterior Nasal Swab   Specimen: Anterior Nasal Swab  Result Value Ref Range   SARS Coronavirus 2 by RT PCR NEGATIVE NEGATIVE   Influenza A by PCR NEGATIVE NEGATIVE   Influenza B by PCR NEGATIVE NEGATIVE   Resp Syncytial Virus by PCR NEGATIVE NEGATIVE  Blood Culture (routine x 2)   Specimen: BLOOD  Result Value Ref Range   Specimen Description BLOOD RIGHT HAND    Special Requests      BOTTLES DRAWN AEROBIC AND ANAEROBIC Blood Culture adequate volume   Culture      NO GROWTH 5 DAYS Performed at [facility], [address] [place], [place] [address]    Report Status 06/19/2023 FINAL   Blood Culture (routine x 2)   Specimen: BLOOD  Result Value Ref Range   Specimen Description BLOOD RIGHT ARM    Special Requests      BOTTLES DRAWN AEROBIC AND ANAEROBIC Blood Culture adequate volume   Culture      NO GROWTH 5 DAYS Performed at [facility], [address] [place], [place] [address]    Report Status 06/19/2023 FINAL   MRSA Next Gen by PCR, Nasal   Specimen: Nasal Mucosa; Nasal Swab  Result Value Ref Range   MRSA by PCR Next Gen NOT DETECTED NOT DETECTED  CBC with Differential/Platelet  Result Value Ref Range   WBC 10.7 (H) 4.0  - 10.5 K/uL   RBC 4.93 4.22 - 5.81 MIL/uL   Hemoglobin 15.0 13.0 - 17.0 g/dL   HCT 82.9 56.2 - 13.0 %   MCV 93.9 80.0 - 100.0 fL   MCH 30.4 26.0 - 34.0 pg   MCHC 32.4 30.0 - 36.0 g/dL   RDW 86.5 (H) 78.4 - 69.6 %   Platelets 223 150 - 400 K/uL   nRBC 0.0 0.0 - 0.2 %   Neutrophils Relative % 84 %   Neutro Abs 9.0 (H) 1.7 - 7.7 K/uL   Lymphocytes Relative 8 %   Lymphs Abs 0.8 0.7 - 4.0 K/uL   Monocytes Relative 7 %   Monocytes Absolute 0.8 0.1 - 1.0 K/uL   Eosinophils Relative 0 %   Eosinophils Absolute 0.0 0.0 - 0.5 K/uL   Basophils Relative 0 %   Basophils Absolute 0.0 0.0 - 0.1 K/uL   Immature Granulocytes 1 %   Abs Immature Granulocytes 0.08 (H) 0.00 - 0.07 K/uL  Basic metabolic  panel  Result Value Ref Range   Sodium 135 135 - 145 mmol/L   Potassium 4.4 3.5 - 5.1 mmol/L   Chloride 102 98 - 111 mmol/L   CO2 23 22 - 32 mmol/L   Glucose, Bld 145 (H) 70 - 99 mg/dL   BUN 20 6 - 20 mg/dL   Creatinine, Ser 7.82 (H) 0.61 - 1.24 mg/dL   Calcium 8.9 8.9 - 95.6 mg/dL   GFR, Estimated 53 (L) >60 mL/min   Anion gap 10 5 - 15  Protime-INR  Result Value Ref Range   Prothrombin Time 18.8 (H) 11.4 - 15.2 seconds   INR 1.6 (H) 0.8 - 1.2  Brain natriuretic peptide  Result Value Ref Range   B Natriuretic Peptide 835.3 (H) 0.0 - 100.0 pg/mL  Lactic acid, plasma  Result Value Ref Range   Lactic Acid, Venous 2.5 (HH) 0.5 - 1.9 mmol/L  Lactic acid, plasma  Result Value Ref Range   Lactic Acid, Venous 1.7 0.5 - 1.9 mmol/L  Urinalysis, w/ Reflex to Culture (Infection Suspected) -Urine, Clean Catch  Result Value Ref Range   Specimen Source URINE, CLEAN CATCH    Color, Urine YELLOW (A) YELLOW   APPearance CLEAR (A) CLEAR   Specific Gravity, Urine 1.037 (H) 1.005 - 1.030   pH 5.0 5.0 - 8.0   Glucose, UA NEGATIVE NEGATIVE mg/dL   Hgb urine dipstick NEGATIVE NEGATIVE   Bilirubin Urine NEGATIVE NEGATIVE   Ketones, ur NEGATIVE NEGATIVE mg/dL   Protein, ur >=213 (A) NEGATIVE mg/dL    Nitrite NEGATIVE NEGATIVE   Leukocytes,Ua NEGATIVE NEGATIVE   RBC / HPF 0-5 0 - 5 RBC/hpf   WBC, UA 0-5 0 - 5 WBC/hpf   Bacteria, UA RARE (A) NONE SEEN   Squamous Epithelial / HPF 0-5 0 - 5 /HPF   Mucus PRESENT   Heparin level (unfractionated)  Result Value Ref Range   Heparin Unfractionated 0.46 0.30 - 0.70 IU/mL  Hemoglobin A1c  Result Value Ref Range   Hgb A1c MFr Bld 6.1 (H) 4.8 - 5.6 %   Mean Plasma Glucose 128.37 mg/dL  Sedimentation rate  Result Value Ref Range   Sed Rate 10 0 - 20 mm/hr  C-reactive protein  Result Value Ref Range   CRP 1.3 (H) <1.0 mg/dL  Prealbumin  Result Value Ref Range   Prealbumin 23 18 - 38 mg/dL  CBC  Result Value Ref Range   WBC 6.8 4.0 - 10.5 K/uL   RBC 4.46 4.22 - 5.81 MIL/uL   Hemoglobin 13.6 13.0 - 17.0 g/dL   HCT 08.6 57.8 - 46.9 %   MCV 93.0 80.0 - 100.0 fL   MCH 30.5 26.0 - 34.0 pg   MCHC 32.8 30.0 - 36.0 g/dL   RDW 62.9 (H) 52.8 - 41.3 %   Platelets 190 150 - 400 K/uL   nRBC 0.0 0.0 - 0.2 %  Comprehensive metabolic panel  Result Value Ref Range   Sodium 132 (L) 135 - 145 mmol/L   Potassium 4.2 3.5 - 5.1 mmol/L   Chloride 103 98 - 111 mmol/L   CO2 21 (L) 22 - 32 mmol/L   Glucose, Bld 114 (H) 70 - 99 mg/dL   BUN 18 6 - 20 mg/dL   Creatinine, Ser 2.44 (H) 0.61 - 1.24 mg/dL   Calcium 8.3 (L) 8.9 - 10.3 mg/dL   Total Protein 7.3 6.5 - 8.1 g/dL   Albumin 3.2 (L) 3.5 - 5.0 g/dL   AST 15 15 -  41 U/L   ALT 15 0 - 44 U/L   Alkaline Phosphatase 48 38 - 126 U/L   Total Bilirubin 1.2 0.3 - 1.2 mg/dL   GFR, Estimated >46 >96 mL/min   Anion gap 8 5 - 15  Glucose, capillary  Result Value Ref Range   Glucose-Capillary 122 (H) 70 - 99 mg/dL  Heparin level (unfractionated)  Result Value Ref Range   Heparin Unfractionated 0.44 0.30 - 0.70 IU/mL  Glucose, capillary  Result Value Ref Range   Glucose-Capillary 135 (H) 70 - 99 mg/dL  Heparin level (unfractionated)  Result Value Ref Range   Heparin Unfractionated 0.42 0.30 - 0.70 IU/mL   Glucose, capillary  Result Value Ref Range   Glucose-Capillary 131 (H) 70 - 99 mg/dL  Glucose, capillary  Result Value Ref Range   Glucose-Capillary 114 (H) 70 - 99 mg/dL  Glucose, capillary  Result Value Ref Range   Glucose-Capillary 132 (H) 70 - 99 mg/dL  Glucose, capillary  Result Value Ref Range   Glucose-Capillary 141 (H) 70 - 99 mg/dL  Heparin level (unfractionated)  Result Value Ref Range   Heparin Unfractionated 0.16 (L) 0.30 - 0.70 IU/mL  CBC  Result Value Ref Range   WBC 4.4 4.0 - 10.5 K/uL   RBC 4.46 4.22 - 5.81 MIL/uL   Hemoglobin 13.6 13.0 - 17.0 g/dL   HCT 29.5 28.4 - 13.2 %   MCV 93.7 80.0 - 100.0 fL   MCH 30.5 26.0 - 34.0 pg   MCHC 32.5 30.0 - 36.0 g/dL   RDW 44.0 (H) 10.2 - 72.5 %   Platelets 176 150 - 400 K/uL   nRBC 0.0 0.0 - 0.2 %  Magnesium  Result Value Ref Range   Magnesium 1.8 1.7 - 2.4 mg/dL  Phosphorus  Result Value Ref Range   Phosphorus 3.3 2.5 - 4.6 mg/dL  Protime-INR  Result Value Ref Range   Prothrombin Time 20.7 (H) 11.4 - 15.2 seconds   INR 1.8 (H) 0.8 - 1.2  Basic metabolic panel  Result Value Ref Range   Sodium 133 (L) 135 - 145 mmol/L   Potassium 3.8 3.5 - 5.1 mmol/L   Chloride 102 98 - 111 mmol/L   CO2 22 22 - 32 mmol/L   Glucose, Bld 97 70 - 99 mg/dL   BUN 19 6 - 20 mg/dL   Creatinine, Ser 3.66 (H) 0.61 - 1.24 mg/dL   Calcium 8.2 (L) 8.9 - 10.3 mg/dL   GFR, Estimated >44 >03 mL/min   Anion gap 9 5 - 15  Glucose, capillary  Result Value Ref Range   Glucose-Capillary 73 70 - 99 mg/dL  Heparin level (unfractionated)  Result Value Ref Range   Heparin Unfractionated 0.41 0.30 - 0.70 IU/mL  Glucose, capillary  Result Value Ref Range   Glucose-Capillary 124 (H) 70 - 99 mg/dL  Glucose, capillary  Result Value Ref Range   Glucose-Capillary 110 (H) 70 - 99 mg/dL  Heparin level (unfractionated)  Result Value Ref Range   Heparin Unfractionated 0.16 (L) 0.30 - 0.70 IU/mL  Glucose, capillary  Result Value Ref Range    Glucose-Capillary 95 70 - 99 mg/dL  CBC  Result Value Ref Range   WBC 4.8 4.0 - 10.5 K/uL   RBC 4.40 4.22 - 5.81 MIL/uL   Hemoglobin 13.6 13.0 - 17.0 g/dL   HCT 47.4 25.9 - 56.3 %   MCV 93.0 80.0 - 100.0 fL   MCH 30.9 26.0 - 34.0 pg   MCHC 33.3  30.0 - 36.0 g/dL   RDW 78.2 (H) 95.6 - 21.3 %   Platelets 207 150 - 400 K/uL   nRBC 0.0 0.0 - 0.2 %  Protime-INR  Result Value Ref Range   Prothrombin Time 23.1 (H) 11.4 - 15.2 seconds   INR 2.0 (H) 0.8 - 1.2  Heparin level (unfractionated)  Result Value Ref Range   Heparin Unfractionated 0.65 0.30 - 0.70 IU/mL  Glucose, capillary  Result Value Ref Range   Glucose-Capillary 115 (H) 70 - 99 mg/dL  Heparin level (unfractionated)  Result Value Ref Range   Heparin Unfractionated 0.35 0.30 - 0.70 IU/mL  Basic metabolic panel  Result Value Ref Range   Sodium 134 (L) 135 - 145 mmol/L   Potassium 3.7 3.5 - 5.1 mmol/L   Chloride 103 98 - 111 mmol/L   CO2 20 (L) 22 - 32 mmol/L   Glucose, Bld 106 (H) 70 - 99 mg/dL   BUN 19 6 - 20 mg/dL   Creatinine, Ser 0.86 (H) 0.61 - 1.24 mg/dL   Calcium 8.5 (L) 8.9 - 10.3 mg/dL   GFR, Estimated >57 >84 mL/min   Anion gap 11 5 - 15  Glucose, capillary  Result Value Ref Range   Glucose-Capillary 101 (H) 70 - 99 mg/dL  Glucose, capillary  Result Value Ref Range   Glucose-Capillary 124 (H) 70 - 99 mg/dL  Glucose, capillary  Result Value Ref Range   Glucose-Capillary 150 (H) 70 - 99 mg/dL  Glucose, capillary  Result Value Ref Range   Glucose-Capillary 109 (H) 70 - 99 mg/dL  Protime-INR  Result Value Ref Range   Prothrombin Time 25.1 (H) 11.4 - 15.2 seconds   INR 2.2 (H) 0.8 - 1.2  Heparin level (unfractionated)  Result Value Ref Range   Heparin Unfractionated 0.50 0.30 - 0.70 IU/mL  CBC  Result Value Ref Range   WBC 4.6 4.0 - 10.5 K/uL   RBC 4.59 4.22 - 5.81 MIL/uL   Hemoglobin 13.8 13.0 - 17.0 g/dL   HCT 69.6 29.5 - 28.4 %   MCV 93.0 80.0 - 100.0 fL   MCH 30.1 26.0 - 34.0 pg   MCHC 32.3  30.0 - 36.0 g/dL   RDW 13.2 (H) 44.0 - 10.2 %   Platelets 224 150 - 400 K/uL   nRBC 0.0 0.0 - 0.2 %  Basic metabolic panel  Result Value Ref Range   Sodium 136 135 - 145 mmol/L   Potassium 3.3 (L) 3.5 - 5.1 mmol/L   Chloride 100 98 - 111 mmol/L   CO2 25 22 - 32 mmol/L   Glucose, Bld 111 (H) 70 - 99 mg/dL   BUN 22 (H) 6 - 20 mg/dL   Creatinine, Ser 7.25 (H) 0.61 - 1.24 mg/dL   Calcium 8.2 (L) 8.9 - 10.3 mg/dL   GFR, Estimated >36 >64 mL/min   Anion gap 11 5 - 15  Glucose, capillary  Result Value Ref Range   Glucose-Capillary 137 (H) 70 - 99 mg/dL  Glucose, capillary  Result Value Ref Range   Glucose-Capillary 104 (H) 70 - 99 mg/dL  Magnesium  Result Value Ref Range   Magnesium 1.9 1.7 - 2.4 mg/dL  Glucose, capillary  Result Value Ref Range   Glucose-Capillary 145 (H) 70 - 99 mg/dL  Glucose, capillary  Result Value Ref Range   Glucose-Capillary 152 (H) 70 - 99 mg/dL  Protime-INR  Result Value Ref Range   Prothrombin Time 30.1 (H) 11.4 - 15.2 seconds   INR  2.8 (H) 0.8 - 1.2  Heparin level (unfractionated)  Result Value Ref Range   Heparin Unfractionated 0.58 0.30 - 0.70 IU/mL  CBC  Result Value Ref Range   WBC 5.0 4.0 - 10.5 K/uL   RBC 5.08 4.22 - 5.81 MIL/uL   Hemoglobin 15.4 13.0 - 17.0 g/dL   HCT 16.1 09.6 - 04.5 %   MCV 93.1 80.0 - 100.0 fL   MCH 30.3 26.0 - 34.0 pg   MCHC 32.6 30.0 - 36.0 g/dL   RDW 40.9 (H) 81.1 - 91.4 %   Platelets 254 150 - 400 K/uL   nRBC 0.0 0.0 - 0.2 %  Magnesium  Result Value Ref Range   Magnesium 1.9 1.7 - 2.4 mg/dL  Glucose, capillary  Result Value Ref Range   Glucose-Capillary 130 (H) 70 - 99 mg/dL  Basic metabolic panel  Result Value Ref Range   Sodium 133 (L) 135 - 145 mmol/L   Potassium 3.6 3.5 - 5.1 mmol/L   Chloride 99 98 - 111 mmol/L   CO2 23 22 - 32 mmol/L   Glucose, Bld 105 (H) 70 - 99 mg/dL   BUN 25 (H) 6 - 20 mg/dL   Creatinine, Ser 7.82 (H) 0.61 - 1.24 mg/dL   Calcium 8.2 (L) 8.9 - 10.3 mg/dL   GFR, Estimated 49  (L) >60 mL/min   Anion gap 11 5 - 15  Glucose, capillary  Result Value Ref Range   Glucose-Capillary 130 (H) 70 - 99 mg/dL  Glucose, capillary  Result Value Ref Range   Glucose-Capillary 140 (H) 70 - 99 mg/dL  Protime-INR  Result Value Ref Range   Prothrombin Time 28.9 (H) 11.4 - 15.2 seconds   INR 2.7 (H) 0.8 - 1.2  Basic metabolic panel  Result Value Ref Range   Sodium 136 135 - 145 mmol/L   Potassium 4.1 3.5 - 5.1 mmol/L   Chloride 100 98 - 111 mmol/L   CO2 24 22 - 32 mmol/L   Glucose, Bld 122 (H) 70 - 99 mg/dL   BUN 31 (H) 6 - 20 mg/dL   Creatinine, Ser 9.56 (H) 0.61 - 1.24 mg/dL   Calcium 8.9 8.9 - 21.3 mg/dL   GFR, Estimated 43 (L) >60 mL/min   Anion gap 12 5 - 15  Glucose, capillary  Result Value Ref Range   Glucose-Capillary 131 (H) 70 - 99 mg/dL  Glucose, capillary  Result Value Ref Range   Glucose-Capillary 114 (H) 70 - 99 mg/dL  Glucose, capillary  Result Value Ref Range   Glucose-Capillary 136 (H) 70 - 99 mg/dL  Protime-INR  Result Value Ref Range   Prothrombin Time 33.5 (H) 11.4 - 15.2 seconds   INR 3.3 (H) 0.8 - 1.2  Basic metabolic panel  Result Value Ref Range   Sodium 135 135 - 145 mmol/L   Potassium 4.0 3.5 - 5.1 mmol/L   Chloride 101 98 - 111 mmol/L   CO2 24 22 - 32 mmol/L   Glucose, Bld 124 (H) 70 - 99 mg/dL   BUN 36 (H) 6 - 20 mg/dL   Creatinine, Ser 0.86 (H) 0.61 - 1.24 mg/dL   Calcium 8.6 (L) 8.9 - 10.3 mg/dL   GFR, Estimated 46 (L) >60 mL/min   Anion gap 10 5 - 15  CBC  Result Value Ref Range   WBC 4.7 4.0 - 10.5 K/uL   RBC 4.91 4.22 - 5.81 MIL/uL   Hemoglobin 14.8 13.0 - 17.0 g/dL   HCT 57.8 46.9 -  52.0 %   MCV 92.3 80.0 - 100.0 fL   MCH 30.1 26.0 - 34.0 pg   MCHC 32.7 30.0 - 36.0 g/dL   RDW 16.1 (H) 09.6 - 04.5 %   Platelets 309 150 - 400 K/uL   nRBC 0.0 0.0 - 0.2 %  Glucose, capillary  Result Value Ref Range   Glucose-Capillary 127 (H) 70 - 99 mg/dL  Glucose, capillary  Result Value Ref Range   Glucose-Capillary 98 70 - 99  mg/dL  Glucose, capillary  Result Value Ref Range   Glucose-Capillary 134 (H) 70 - 99 mg/dL   Comment 1 Notify RN    Comment 2 Document in Chart   Glucose, capillary  Result Value Ref Range   Glucose-Capillary 122 (H) 70 - 99 mg/dL  Protime-INR  Result Value Ref Range   Prothrombin Time 33.7 (H) 11.4 - 15.2 seconds   INR 3.3 (H) 0.8 - 1.2  CBC  Result Value Ref Range   WBC 3.9 (L) 4.0 - 10.5 K/uL   RBC 4.72 4.22 - 5.81 MIL/uL   Hemoglobin 14.2 13.0 - 17.0 g/dL   HCT 40.9 81.1 - 91.4 %   MCV 91.1 80.0 - 100.0 fL   MCH 30.1 26.0 - 34.0 pg   MCHC 33.0 30.0 - 36.0 g/dL   RDW 78.2 (H) 95.6 - 21.3 %   Platelets 316 150 - 400 K/uL   nRBC 0.0 0.0 - 0.2 %  Basic metabolic panel  Result Value Ref Range   Sodium 134 (L) 135 - 145 mmol/L   Potassium 4.2 3.5 - 5.1 mmol/L   Chloride 102 98 - 111 mmol/L   CO2 23 22 - 32 mmol/L   Glucose, Bld 105 (H) 70 - 99 mg/dL   BUN 39 (H) 6 - 20 mg/dL   Creatinine, Ser 0.86 (H) 0.61 - 1.24 mg/dL   Calcium 8.7 (L) 8.9 - 10.3 mg/dL   GFR, Estimated 50 (L) >60 mL/min   Anion gap 9 5 - 15  Glucose, capillary  Result Value Ref Range   Glucose-Capillary 110 (H) 70 - 99 mg/dL  Glucose, capillary  Result Value Ref Range   Glucose-Capillary 111 (H) 70 - 99 mg/dL  Glucose, capillary  Result Value Ref Range   Glucose-Capillary 123 (H) 70 - 99 mg/dL  Protime-INR  Result Value Ref Range   Prothrombin Time 29.9 (H) 11.4 - 15.2 seconds   INR 2.8 (H) 0.8 - 1.2  Basic metabolic panel  Result Value Ref Range   Sodium 135 135 - 145 mmol/L   Potassium 4.3 3.5 - 5.1 mmol/L   Chloride 102 98 - 111 mmol/L   CO2 26 22 - 32 mmol/L   Glucose, Bld 106 (H) 70 - 99 mg/dL   BUN 36 (H) 6 - 20 mg/dL   Creatinine, Ser 5.78 (H) 0.61 - 1.24 mg/dL   Calcium 8.7 (L) 8.9 - 10.3 mg/dL   GFR, Estimated 55 (L) >60 mL/min   Anion gap 7 5 - 15  Glucose, capillary  Result Value Ref Range   Glucose-Capillary 110 (H) 70 - 99 mg/dL  Glucose, capillary  Result Value Ref Range    Glucose-Capillary 119 (H) 70 - 99 mg/dL  Glucose, capillary  Result Value Ref Range   Glucose-Capillary 130 (H) 70 - 99 mg/dL  Troponin I (High Sensitivity)  Result Value Ref Range   Troponin I (High Sensitivity) 48 (H) <18 ng/L  Troponin I (High Sensitivity)  Result Value Ref Range   Troponin  I (High Sensitivity) 53 (H) <18 ng/L      Assessment & Plan:   Problem List Items Addressed This Visit   None    Follow up plan: No follow-ups on file.

## 2023-07-04 ENCOUNTER — Ambulatory Visit (INDEPENDENT_AMBULATORY_CARE_PROVIDER_SITE_OTHER): Payer: No Typology Code available for payment source | Admitting: Nurse Practitioner

## 2023-07-04 ENCOUNTER — Encounter: Payer: Self-pay | Admitting: Nurse Practitioner

## 2023-07-04 VITALS — BP 131/85 | HR 87 | Temp 97.8°F | Ht 67.0 in | Wt 342.2 lb

## 2023-07-04 DIAGNOSIS — A419 Sepsis, unspecified organism: Secondary | ICD-10-CM | POA: Diagnosis not present

## 2023-07-04 DIAGNOSIS — R6520 Severe sepsis without septic shock: Secondary | ICD-10-CM

## 2023-07-04 DIAGNOSIS — D689 Coagulation defect, unspecified: Secondary | ICD-10-CM

## 2023-07-04 DIAGNOSIS — I483 Typical atrial flutter: Secondary | ICD-10-CM

## 2023-07-04 DIAGNOSIS — J9621 Acute and chronic respiratory failure with hypoxia: Secondary | ICD-10-CM

## 2023-07-04 DIAGNOSIS — Z09 Encounter for follow-up examination after completed treatment for conditions other than malignant neoplasm: Secondary | ICD-10-CM

## 2023-07-04 DIAGNOSIS — Z0289 Encounter for other administrative examinations: Secondary | ICD-10-CM

## 2023-07-04 DIAGNOSIS — I5032 Chronic diastolic (congestive) heart failure: Secondary | ICD-10-CM

## 2023-07-04 LAB — COAGUCHEK XS/INR WAIVED
INR: 3.5 — ABNORMAL HIGH (ref 0.9–1.1)
Prothrombin Time: 42 s

## 2023-07-04 NOTE — Assessment & Plan Note (Signed)
NSR in office today.  Continue with Digoxin.  Level checked at visit today.

## 2023-07-04 NOTE — Assessment & Plan Note (Signed)
Chronic concern.  Patient does not have O2 in the office today.  RR 26 on room air with O2 sat at 93%.  Encouraged patient to make a sooner appointment with Pulmonology and to wear 1L of O2 to keep sats >94%.

## 2023-07-04 NOTE — Assessment & Plan Note (Addendum)
Secondary to LLE wound. Completed Keflex.  Would benefit from Infectious Disease referral. However, patient has several other specialists he is working with.  Will wait to place referral at future visit.  Continue to follow up with wound care for new RLE wound.

## 2023-07-04 NOTE — Assessment & Plan Note (Signed)
INR in office 3.5.  Skip today's dose and resume tomorrow.  Follow up in 2 weeks. Patient has INR appt with Cardiology next week.

## 2023-07-04 NOTE — Assessment & Plan Note (Signed)
Chronic. Blood pressure controlled.  Patient no longer on Entresto.  Currently taking Losartan. Labs ordered today.

## 2023-07-05 ENCOUNTER — Telehealth: Payer: Self-pay | Admitting: Nurse Practitioner

## 2023-07-05 ENCOUNTER — Encounter: Payer: No Typology Code available for payment source | Attending: Physician Assistant | Admitting: Physician Assistant

## 2023-07-05 DIAGNOSIS — I2782 Chronic pulmonary embolism: Secondary | ICD-10-CM | POA: Diagnosis not present

## 2023-07-05 DIAGNOSIS — L97822 Non-pressure chronic ulcer of other part of left lower leg with fat layer exposed: Secondary | ICD-10-CM | POA: Diagnosis not present

## 2023-07-05 DIAGNOSIS — I130 Hypertensive heart and chronic kidney disease with heart failure and stage 1 through stage 4 chronic kidney disease, or unspecified chronic kidney disease: Secondary | ICD-10-CM | POA: Diagnosis not present

## 2023-07-05 DIAGNOSIS — E1122 Type 2 diabetes mellitus with diabetic chronic kidney disease: Secondary | ICD-10-CM | POA: Diagnosis not present

## 2023-07-05 DIAGNOSIS — E1151 Type 2 diabetes mellitus with diabetic peripheral angiopathy without gangrene: Secondary | ICD-10-CM | POA: Diagnosis not present

## 2023-07-05 DIAGNOSIS — I87332 Chronic venous hypertension (idiopathic) with ulcer and inflammation of left lower extremity: Secondary | ICD-10-CM | POA: Diagnosis not present

## 2023-07-05 DIAGNOSIS — E11622 Type 2 diabetes mellitus with other skin ulcer: Secondary | ICD-10-CM | POA: Insufficient documentation

## 2023-07-05 DIAGNOSIS — L97522 Non-pressure chronic ulcer of other part of left foot with fat layer exposed: Secondary | ICD-10-CM | POA: Insufficient documentation

## 2023-07-05 DIAGNOSIS — E11621 Type 2 diabetes mellitus with foot ulcer: Secondary | ICD-10-CM | POA: Insufficient documentation

## 2023-07-05 DIAGNOSIS — N183 Chronic kidney disease, stage 3 unspecified: Secondary | ICD-10-CM | POA: Diagnosis not present

## 2023-07-05 DIAGNOSIS — I890 Lymphedema, not elsewhere classified: Secondary | ICD-10-CM | POA: Diagnosis not present

## 2023-07-05 DIAGNOSIS — Z6841 Body Mass Index (BMI) 40.0 and over, adult: Secondary | ICD-10-CM | POA: Diagnosis not present

## 2023-07-05 DIAGNOSIS — E669 Obesity, unspecified: Secondary | ICD-10-CM | POA: Diagnosis not present

## 2023-07-05 DIAGNOSIS — I5042 Chronic combined systolic (congestive) and diastolic (congestive) heart failure: Secondary | ICD-10-CM | POA: Insufficient documentation

## 2023-07-05 LAB — COMPREHENSIVE METABOLIC PANEL
ALT: 35 [IU]/L (ref 0–44)
AST: 28 [IU]/L (ref 0–40)
Albumin: 3.9 g/dL (ref 3.8–4.9)
Alkaline Phosphatase: 69 [IU]/L (ref 44–121)
BUN/Creatinine Ratio: 13 (ref 9–20)
BUN: 17 mg/dL (ref 6–24)
Bilirubin Total: 0.4 mg/dL (ref 0.0–1.2)
CO2: 16 mmol/L — ABNORMAL LOW (ref 20–29)
Calcium: 9 mg/dL (ref 8.7–10.2)
Chloride: 108 mmol/L — ABNORMAL HIGH (ref 96–106)
Creatinine, Ser: 1.36 mg/dL — ABNORMAL HIGH (ref 0.76–1.27)
Globulin, Total: 3.7 g/dL (ref 1.5–4.5)
Glucose: 107 mg/dL — ABNORMAL HIGH (ref 70–99)
Potassium: 4.8 mmol/L (ref 3.5–5.2)
Sodium: 143 mmol/L (ref 134–144)
Total Protein: 7.6 g/dL (ref 6.0–8.5)
eGFR: 62 mL/min/{1.73_m2} (ref >59)

## 2023-07-05 LAB — CBC WITH DIFFERENTIAL/PLATELET
Basophils Absolute: 0 10*3/uL (ref 0.0–0.2)
Basos: 1 %
EOS (ABSOLUTE): 0.1 10*3/uL (ref 0.0–0.4)
Eos: 2 %
Hematocrit: 45.2 % (ref 37.5–51.0)
Hemoglobin: 14.5 g/dL (ref 13.0–17.7)
Immature Grans (Abs): 0 10*3/uL (ref 0.0–0.1)
Immature Granulocytes: 1 %
Lymphocytes Absolute: 1.6 10*3/uL (ref 0.7–3.1)
Lymphs: 45 %
MCH: 30.3 pg (ref 26.6–33.0)
MCHC: 32.1 g/dL (ref 31.5–35.7)
MCV: 94 fL (ref 79–97)
Monocytes Absolute: 0.4 10*3/uL (ref 0.1–0.9)
Monocytes: 11 %
Neutrophils Absolute: 1.4 10*3/uL (ref 1.4–7.0)
Neutrophils: 40 %
Platelets: 300 10*3/uL (ref 150–450)
RBC: 4.79 x10E6/uL (ref 4.14–5.80)
RDW: 14.4 % (ref 11.6–15.4)
WBC: 3.5 10*3/uL (ref 3.4–10.8)

## 2023-07-05 NOTE — Telephone Encounter (Signed)
Called and informed patient that the paperwork has been completed and has been faxed back no patient signature was required. Pt voiced understanding

## 2023-07-05 NOTE — Progress Notes (Signed)
HATTIE, AGUINALDO [name] ([id number]) [id number] Page 1 of 9 Visit Report for 07/05/2023 Chief Complaint Document Details Patient Name: Date of Service: Ryan [name], Ryan [name]. 07/05/2023 7:45 [name] Medical Record Number: [id number] Patient Account Number: [id number] Date of Birth/Sex: Treating RN: 1971-02-12 (52 y.o. [name] Primary Care Provider: [name] Other Clinician: Referring Provider: Treating Provider/Extender: [name] in Treatment: 11 Information Obtained from: Patient Chief Complaint Left leg and left foot ulcers Electronic Signature(s) Signed: 07/05/2023 7:49:44 AM By: [name] PA-C Entered By: [name] on 07/05/2023 07:49:43 -------------------------------------------------------------------------------- Debridement Details Patient Name: Date of Service: Ryan Elm RLES [name]. 07/05/2023 7:45 [name] Medical Record Number: [id number] Patient Account Number: [id number] Date of Birth/Sex: Treating RN: [birth year] (52 y.o. [name] Primary Care Provider: [name] Other Clinician: Referring Provider: Treating Provider/Extender: [name] in Treatment: 11 Debridement Performed for Assessment: Wound #3 Right,Anterior Lower Leg Performed By: Physician [name], PA-C The following information was scribed by: Midge Aver The information was scribed for: [name] Debridement Type: Debridement Severity of Tissue Pre Debridement: Fat layer exposed Level of Consciousness (Pre-procedure): Awake and Alert Pre-procedure Verification/Time Out Yes - 08:10 Taken: Start Time: 08:10 Percent of Wound Bed Debrided: 100% T Area Debrided (cm): otal 0.44 Tissue and other material debrided: Viable, Non-Viable, Slough, Subcutaneous, Slough Level: Skin/Subcutaneous Tissue Debridement Description: Excisional Instrument: Curette Bleeding: Minimum Hemostasis Achieved: Pressure Ryan [name], Ryan [name]  ([id number]) [id number] Page 2 of 9 Procedural Pain: 0 Post Procedural Pain: 0 Response to Treatment: Procedure was tolerated well Level of Consciousness (Post- Awake and Alert procedure): Post Debridement Measurements of Total Wound Length: (cm) 0.7 Width: (cm) 0.8 Depth: (cm) 0.1 Volume: (cm) 0.044 Character of Wound/Ulcer Post Debridement: Stable Severity of Tissue Post Debridement: Fat layer exposed Post Procedure Diagnosis Same as Pre-procedure Electronic Signature(s) Signed: 07/05/2023 3:24:56 PM By: [name] PA-C Signed: 07/05/2023 5:02:23 PM By: Midge Aver MSN RN CNS WTA Entered By: Midge Aver on 07/05/2023 08:32:59 -------------------------------------------------------------------------------- HPI Details Patient Name: Date of Service: Ryan Elm RLES [name]. 07/05/2023 7:45 [name] Medical Record Number: [id number] Patient Account Number: [id number] Date of Birth/Sex: Treating RN: 1970-11-04 (52 y.o. [name] Primary Care Provider: [name] Other Clinician: Referring Provider: Treating Provider/Extender: [name] in Treatment: 11 History of Present Illness Chronic/Inactive Conditions Condition 1: 04-18-2023 patient screening ABI today was 0.83 and this was on the left. With that being said he had previous ABIs done formally on 06-25-2021 with [name] right ABI of 1.12 and [name] TBI of 1.03 on the left she had an ABI of 1.12 with [name] TBI of 0.98. HPI Description: 04-18-2023 upon evaluation today patient presents for initial inspection here in our clinic some concerning issues he is having with his left proximal foot as well as his left lower extremity. This actually appears to be more of [name] situation to be honest. He does have chronic lower extremity swelling secondary to what appears to be venous stasis he has not really had any wounds significantly before and tells me that it has been [name]  probably even 12 or so since he has last used his lymphedema pumps. He is not even sure if they are actually functioning anymore or not. With that being said this is 1 thing that I did want him to look into. He is on [name] fluid pill and he does not wear compression socks on [name] regular basis although I definitely think  that something that he also really needs to be doing to be honest. He does not have any injury that occurred to cause these wounds they more or less spontaneously occurred as [name] result of what appears to be excessive swelling. Patient does have [name] history of diabetes mellitus type 2, congestive heart failure, lymphedema, venous hypertension, hypertension, peripheral vascular disease, and chronic kidney disease stage III. 8/26; this is [name] patient with chronic lymphedema. He has wounds on the left medial lower extremity and left medial foot. We have been using Hydrofera Blue and Urgo K2. He tells me he has [name] compression pump at home although it is missing apart and he is not really familiar with with which company to call. He also started on furosemide 20 mg every other day probably would do better with every day and he is talking to his physician about this. 05-05-2023 upon evaluation today patient appears to be doing pretty well currently in regard to his wound. Fortunately there does not appear to be any signs of infection he does have some slough and biofilm noted I did discuss with the patient today I do believe that he would benefit from [name] continuation of therapy with regard to his wound. I think we may need to increase the compression however and also think that he may benefit from targeted debridement today. 05-09-2023 upon evaluation today patient appears to be doing well currently in regard to his wound on the leg. He and both locations seems to be showing signs of improvement which is great news and in general I do believe there were making excellent headway towards complete closure which is  great news. I do not see any signs of active infection at this time which is also excellent news. 05-16-2023 upon evaluation today patient appears to be doing well currently with regard to his wound we did actually have [name] check at this today but that is not the biggest thing I am concerned about at this point. He unfortunately is having some issues here with oxygen saturation dropping to around 84% times and then bouncing back up and down he is breathing rapidly he is also fluctuating with regard to pulse anywhere from 88 that I got manually to it at some points even up at 110 his body does seem to be compensating. He tells me that he sleeps with oxygen but does not wear it during the day typically he has been short of breath for [name] little under [name] week. Shortly after he was here last week. With that being said the patient tells me that he does have [name] history of pulmonary embolisms. He also tells me that he has been having some issues catching his breath with ambulation as well which is also [name] big part of the issue at this point. Ryan [name], Ryan [name] ([id number]) [id number] Page 3 of 9 Fortunately I do not see any signs of obvious infection and is listening to him I could not tell any abnormal breath sounds although he may be [name] little bit restricted although I cannot be for sure. He tells me that the only thing he may have had [name] little bit of chills but nothing else significant. Nonetheless I think he needs to be evaluated at the ER ASAP. 05-26-2023 upon evaluation today patient appears to be doing well currently in regard to his wounds which are actually significantly better. He did have [name] hospital course where he just got out yesterday he ended up having pulmonary emboli at multiple in  fact I think I read 14 that were present and had to be removed. He is now on additional blood thinners and states that he is in [name] good place as far as the blood thinners are concerned. He is feeling  much better as well able to breathe quite nicely. I am very pleased with where things stand currently and do not see any signs of infection at this time which is also good news. 05-31-2023 upon evaluation today patient appears to be doing well with regard to his wound one of them is healed the other is getting close. Unfortunately he is having issues again with his breathing today oxygen saturation around 87 to 90% in general during the evaluation at this point. This has me concerned again as he had previous pulmonary emboli does have an appointment with pulmonology tomorrow but I do not think he needs to wait till tomorrow. 06-07-2023 upon evaluation today patient appears to be doing well currently in regard to his wound. He is tolerating the dressing changes this looks to be doing better in general I think removing in the right direction. I do not see any signs of active infection at this time. No fevers, chills, nausea, vomiting, or diarrhea. 06-28-2023 upon evaluation today patient appears to be doing [name] little worse in regard to his wounds due to the fact that unfortunately this has broken down after he was in the hospital most recently. Again this is the biggest concern that I have at this point. I do believe that he is going to have to go back into compression to get these under control and that he is gena need ongoing compression stockings. 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 His oxygen saturation remains low even today in the clinic with just sitting and not even talking he was bouncing between 88 and 90 I am not so sure he should not have oxygen 24/7 at least at low levels. 07-05-2023 upon evaluation today patient appears to be doing well currently in regard to his wound. In fact the left leg is doing significantly better in fact appears to be healed. The right leg is improving but at the same time is not completely healed as of yet. I fortunately do not see any signs of active infection  locally or systemically at this time. No fevers, chills, nausea, vomiting, or diarrhea. Electronic Signature(s) Signed: 07/05/2023 8:12:51 AM By: [name] PA-C Entered By: [name] on 07/05/2023 08:12:51 -------------------------------------------------------------------------------- Physical Exam Details Patient Name: Date of Service: Ryan [name], Ryan RLES [name]. 07/05/2023 7:45 [name] Medical Record Number: [id number] Patient Account Number: [id number] Date of Birth/Sex: Treating RN: 03/04/1971 (52 y.o. [name] Primary Care Provider: [name] Other Clinician: Referring Provider: Treating Provider/Extender: [name] in Treatment: 11 Constitutional Well-nourished and well-hydrated in no acute distress. Respiratory normal breathing without difficulty. Psychiatric this patient is able to make decisions and demonstrates good insight into disease process. Alert and Oriented x 3. pleasant and cooperative. Notes Upon inspection patient's wound bed actually showed signs of good granulation and epithelization at this point. Generally I do not see any evidence of worsening overall and I do believe that the patient is making good headway towards complete closure. No fevers, chills, nausea, vomiting, or diarrhea. Electronic Signature(s) Signed: 07/05/2023 8:13:15 AM By: [name] PA-C Entered By: [name] on 07/05/2023 08:13:15 Ryan [name], Ryan [name] ([id number]) [id number] Page 4 of 9 -------------------------------------------------------------------------------- Physician Orders Details Patient Name: Date of Service: Ryan [name], Ryan RLES [name]. 07/05/2023 7:45  [name] Medical Record Number: [id number] Patient Account Number: [id number] Date of Birth/Sex: Treating RN: 1971/04/15 (52 y.o. [name] Primary Care Provider: [name] Other Clinician: Referring Provider: Treating Provider/Extender: [name] in  Treatment: 11 The following information was scribed by: Midge Aver The information was scribed for: [name] Verbal / Phone Orders: No Diagnosis Coding ICD-10 Coding Code Description E11.622 Type 2 diabetes mellitus with other skin ulcer L97.822 Non-pressure chronic ulcer of other part of left lower leg with fat layer exposed E11.621 Type 2 diabetes mellitus with foot ulcer L97.522 Non-pressure chronic ulcer of other part of left foot with fat layer exposed I50.42 Chronic combined systolic (congestive) and diastolic (congestive) heart failure I89.0 Lymphedema, not elsewhere classified 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 Bathing/ Shower/ Hygiene May shower with wound dressing protected with water repellent cover or cast protector. Anesthetic (Use 'Patient Medications' Section for Anesthetic Order Entry) Lidocaine applied to wound bed Edema Control - Orders / Instructions Other: - Order Juxta Fit Wound Treatment Wound #3 - Lower Leg Wound Laterality: Right, Anterior Cleanser: Soap and Water 1 x Per Week/30 Days Discharge Instructions: Gently cleanse wound with antibacterial soap, rinse and pat dry prior to dressing wounds Cleanser: Wound Cleanser 1 x Per Week/30 Days Discharge Instructions: Wash your hands with soap and water. Remove old dressing, discard into plastic bag and place into trash. Cleanse the wound with Wound Cleanser prior to applying [name] clean dressing using gauze sponges, not tissues or cotton balls. Do not scrub or use excessive force. Pat dry using gauze sponges, not tissue or cotton balls. Prim Dressing: Hydrofera Blue Ready Transfer Foam, 2.5x2.5 (in/in) 1 x Per Week/30 Days ary Discharge Instructions: Apply  Hydrofera Blue Ready to wound bed as directed Secondary Dressing: ABD Pad 5x9 (in/in) 1 x Per Week/30 Days Discharge Instructions: Cover with ABD pad Compression Wrap: Urgo K2, two layer compression system, large 1 x Per Week/30 Days Electronic Signature(s) Signed: 07/05/2023 3:24:56 PM By: Wonda Olds, Ryan [name] ([id number]) [id number] Page 5 of 9 Signed: 07/05/2023 5:02:23 PM By: Midge Aver MSN RN CNS WTA Entered By: Midge Aver on 07/05/2023 08:32:40 -------------------------------------------------------------------------------- Problem List Details Patient Name: Date of Service: Ryan Elm RLES [name]. 07/05/2023 7:45 [name] Medical Record Number: [id number] Patient Account Number: [id number] Date of Birth/Sex: Treating RN: May 14, 1971 (52 y.o. [name] Primary Care Provider: [name] Other Clinician: Referring Provider: Treating Provider/Extender: [name] in Treatment: 11 Active Problems ICD-10 Encounter Code Description Active Date MDM Diagnosis E11.622 Type 2 diabetes mellitus with other skin ulcer 04/18/2023 No Yes L97.822 Non-pressure chronic ulcer of other part of left lower leg with fat layer exposed8/19/2024 No Yes E11.621 Type 2 diabetes mellitus with foot ulcer 04/18/2023 No Yes L97.522 Non-pressure chronic ulcer of other part of left foot with fat layer exposed 04/18/2023 No Yes I50.42 Chronic combined systolic (congestive) and diastolic (congestive) heart failure 04/18/2023 No Yes I89.0 Lymphedema, not elsewhere classified 04/18/2023 No Yes I87.332 Chronic venous hypertension (idiopathic) with ulcer and inflammation of left 04/18/2023 No Yes lower extremity I10 Essential (primary) hypertension 04/18/2023 No Yes I73.89 Other specified peripheral vascular diseases 04/18/2023 No Yes N18.30 Chronic kidney disease, stage 3 unspecified 04/18/2023 No Yes Inactive Problems Resolved Problems Ryan [name], Ryan [name]  ([id number]) [id number] Page 6 of  9 Electronic Signature(s) Signed: 07/05/2023 7:49:40 AM By: [name] PA-C Entered By: [name] on 07/05/2023 07:49:40 -------------------------------------------------------------------------------- Progress Note Details Patient Name: Date of Service: Ryan Elm RLES [name]. 07/05/2023 7:45 [name] Medical Record Number: [id number] Patient Account Number: [id number] Date of Birth/Sex: Treating RN: [birth year] (52 y.o. [name] Primary Care Provider: [name] Other Clinician: Referring Provider: Treating Provider/Extender: [name] in Treatment: 11 Subjective Chief Complaint Information obtained from Patient Left leg and left foot ulcers History of Present Illness (HPI) Chronic/Inactive Condition: 04-18-2023 patient screening ABI today was 0.83 and this was on the left. With that being said he had previous ABIs done formally on 06-25-2021 with [name] right ABI of 1.12 and [name] TBI of 1.03 on the left she had an ABI of 1.12 with [name] TBI of 0.98. 04-18-2023 upon evaluation today patient presents for initial inspection here in our clinic some concerning issues he is having with his left proximal foot as well as his left lower extremity. This actually appears to be more of [name] situation to be honest. He does have chronic lower extremity swelling secondary to what appears to be venous stasis he has not really had any wounds significantly before and tells me that it has been [name] probably even 12 or so since he has last used his lymphedema pumps. He is not even sure if they are actually functioning anymore or not. With that being said this is 1 thing that I did want him to look into. He is on [name] fluid pill and he does not wear compression socks on [name] regular basis although I definitely think that something that he also really needs to be doing to be honest. He does not have any injury  that occurred to cause these wounds they more or less spontaneously occurred as [name] result of what appears to be excessive swelling. Patient does have [name] history of diabetes mellitus type 2, congestive heart failure, lymphedema, venous hypertension, hypertension, peripheral vascular disease, and chronic kidney disease stage III. 8/26; this is [name] patient with chronic lymphedema. He has wounds on the left medial lower extremity and left medial foot. We have been using Hydrofera Blue and Urgo K2. He tells me he has [name] compression pump at home although it is missing apart and he is not really familiar with with which company to call. He also started on furosemide 20 mg every other day probably would do better with every day and he is talking to his physician about this. 05-05-2023 upon evaluation today patient appears to be doing pretty well currently in regard to his wound. Fortunately there does not appear to be any signs of infection he does have some slough and biofilm noted I did discuss with the patient today I do believe that he would benefit from [name] continuation of therapy with regard to his wound. I think we may need to increase the compression however and also think that he may benefit from targeted debridement today. 05-09-2023 upon evaluation today patient appears to be doing well currently in regard to his wound on the leg. He and both locations seems to be showing signs of improvement which is great news and in general I do believe there were making excellent headway towards complete closure which is great news. I do not see any signs of active infection at this time which is also excellent news. 05-16-2023 upon evaluation today patient appears to be doing well currently with regard  to his wound we did actually have [name] check at this today but that is not the biggest thing I am concerned about at this point. He unfortunately is having some issues here with oxygen saturation dropping to around 84% times  and then bouncing back up and down he is breathing rapidly he is also fluctuating with regard to pulse anywhere from 88 that I got manually to it at some points even up at 110 his body does seem to be compensating. He tells me that he sleeps with oxygen but does not wear it during the day typically he has been short of breath for [name] little under [name] week. Shortly after he was here last week. With that being said the patient tells me that he does have [name] history of pulmonary embolisms. He also tells me that he has been having some issues catching his breath with ambulation as well which is also [name] big part of the issue at this point. Fortunately I do not see any signs of obvious infection and is listening to him I could not tell any abnormal breath sounds although he may be [name] little bit restricted although I cannot be for sure. He tells me that the only thing he may have had [name] little bit of chills but nothing else significant. Nonetheless I think he needs to be evaluated at the ER ASAP. 05-26-2023 upon evaluation today patient appears to be doing well currently in regard to his wounds which are actually significantly better. He did have [name] hospital course where he just got out yesterday he ended up having pulmonary emboli at multiple in fact I think I read 14 that were present and had to be removed. He is now on additional blood thinners and states that he is in [name] good place as far as the blood thinners are concerned. He is feeling much better as well able to breathe quite nicely. I am very pleased with where things stand currently and do not see any signs of infection at this time which is also good news. 05-31-2023 upon evaluation today patient appears to be doing well with regard to his wound one of them is healed the other is getting close. Unfortunately he is having issues again with his breathing today oxygen saturation around 87 to 90% in general during the evaluation at this point. This has me concerned  again as he had previous pulmonary emboli does have an appointment with pulmonology tomorrow but I do not think he needs to wait till tomorrow. 06-07-2023 upon evaluation today patient appears to be doing well currently in regard to his wound. He is tolerating the dressing changes this looks to be doing Ryan [name], Ryan [name] ([id number]) [id number] Page 7 of 9 better in general I think removing in the right direction. I do not see any signs of active infection at this time. No fevers, chills, nausea, vomiting, or diarrhea. 06-28-2023 upon evaluation today patient appears to be doing [name] little worse in regard to his wounds due to the fact that unfortunately this has broken down after he was in the hospital most recently. Again this is the biggest concern that I have at this point. I do believe that he is going to have to go back into compression to get these under control and that he is gena need ongoing compression stockings. 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 His oxygen saturation remains low even today in the clinic with just sitting and not even talking he was bouncing between 88  and 90 I am not so sure he should not have oxygen 24/7 at least at low levels. 07-05-2023 upon evaluation today patient appears to be doing well currently in regard to his wound. In fact the left leg is doing significantly better in fact appears to be healed. The right leg is improving but at the same time is not completely healed as of yet. I fortunately do not see any signs of active infection locally or systemically at this time. No fevers, chills, nausea, vomiting, or diarrhea. Objective Constitutional Well-nourished and well-hydrated in no acute distress. Vitals Time Taken: 7:52 AM, Height: 67 in, Weight: 338 lbs, BMI: 52.9, Temperature: 97.9 F, Pulse: 76 bpm, Respiratory Rate: 18 breaths/min, Blood Pressure: 136/92 mmHg. Respiratory normal breathing without difficulty. Psychiatric this  patient is able to make decisions and demonstrates good insight into disease process. Alert and Oriented x 3. pleasant and cooperative. General Notes: Upon inspection patient's wound bed actually showed signs of good granulation and epithelization at this point. Generally I do not see any evidence of worsening overall and I do believe that the patient is making good headway towards complete closure. No fevers, chills, nausea, vomiting, or diarrhea. Integumentary (Hair, Skin) Wound #1 status is Healed - Epithelialized. Original cause of wound was Gradually Appeared. The date acquired was: 02/28/2023. The wound has been in treatment 11 weeks. The wound is located on the Left,Medial Lower Leg. The wound measures 0cm length x 0cm width x 0cm depth; 0cm^2 area and 0cm^3 volume. There is Fat Layer (Subcutaneous Tissue) exposed. There is [name] medium amount of serosanguineous drainage noted. There is no granulation within the wound bed. There is [name] large (67-100%) amount of necrotic tissue within the wound bed including Eschar. Wound #3 status is Open. Original cause of wound was Gradually Appeared. The date acquired was: 06/21/2023. The wound has been in treatment 1 weeks. The wound is located on the Right,Anterior Lower Leg. The wound measures 0.7cm length x 0.8cm width x 0.1cm depth; 0.44cm^2 area and 0.044cm^3 volume. There is Fat Layer (Subcutaneous Tissue) exposed. There is [name] medium amount of drainage noted. There is medium (34-66%) red granulation within the wound bed. There is [name] medium (34-66%) amount of necrotic tissue within the wound bed including Adherent Slough. 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 There was [name] Excisional Skin/Subcutaneous Tissue Debridement with [name] total area of 0.44 sq cm performed by [name], PA-C. With the following instrument(s): Curette to remove Viable and Non-Viable tissue/material. Material removed includes Subcutaneous Tissue and Slough and. No specimens were taken. [name] time out was conducted at 08:10, prior to the start of the procedure. [name] Minimum amount of bleeding was controlled with Pressure. The procedure was tolerated well with [name] pain level of 0 throughout and [name] pain level of 0 following the procedure. Post Debridement Measurements: 0.7cm length x 0.8cm width  x 0.1cm depth; 0.044cm^3 volume. Character of Wound/Ulcer Post Debridement is stable. Severity of Tissue Post Debridement is: Fat layer exposed. Post procedure Diagnosis Wound #3: Same as Pre-Procedure Ryan [name], Ryan [name] ([id number]) [id number] Page 8 of 9 Plan Follow-up Appointments: Return Appointment in 1 week. Bathing/ Shower/ Hygiene: May shower with wound dressing protected with water repellent cover or cast protector. Anesthetic (Use 'Patient Medications' Section for Anesthetic Order Entry): Lidocaine applied to wound bed Edema Control - Orders / Instructions: Other: - Order Juxta Fit WOUND #3: - Lower Leg Wound Laterality: Right, Anterior Cleanser: Soap and Water 1 x Per Week/30 Days Discharge Instructions: Gently cleanse wound with antibacterial soap, rinse and pat dry prior to dressing  wounds Cleanser: Wound Cleanser 1 x Per Week/30 Days Discharge Instructions: Wash your hands with soap and water. Remove old dressing, discard into plastic bag and place into trash. Cleanse the wound with Wound Cleanser prior to applying [name] clean dressing using gauze sponges, not tissues or cotton balls. Do not scrub or use excessive force. Pat dry using gauze sponges, not tissue or cotton balls. Prim Dressing: Hydrofera Blue Ready Transfer Foam, 2.5x2.5 (in/in) 1 x Per Week/30 Days ary Discharge Instructions: Apply Hydrofera Blue Ready to wound bed as directed Secondary Dressing: ABD Pad 5x9 (in/in) 1 x Per Week/30 Days Discharge Instructions: Cover with ABD pad Com pression Wrap: Urgo K2, two layer compression system, large 1 x Per Week/30 Days 1. I would recommend based on what I am seeing that we have the patient going continue to monitor for any signs of infection or worsening. I do believe that he is doing decently well here with regard to his wounds left leg healed the right leg significantly improved. 2. I am good recommend as well that we continue with the [facility] Blue followed by ABD pad and Urgo K2 compression wrap. 3. Were also going to see about ordering juxta fit compression wraps for the patient bilaterally. We will see patient back for reevaluation in 1 week here in the clinic. If anything worsens or changes patient will contact our office for additional recommendations. Electronic Signature(s) Signed: 07/05/2023 8:13:47 AM By: [name] PA-C Entered By: [name] on 07/05/2023 08:13:47 -------------------------------------------------------------------------------- SuperBill Details Patient Name: Date of Service: Ryan Elm RLES [name]. 07/05/2023 Medical Record Number: [id number] Patient Account Number: [id number] Date of Birth/Sex: Treating RN: 01-May-1971 (52 y.o. [name] Primary Care Provider: [name] Other Clinician: Referring Provider: Treating  Provider/Extender: [name] in Treatment: 11 Diagnosis Coding ICD-10 Codes Code Description E11.622 Type 2 diabetes mellitus with other skin ulcer L97.822 Non-pressure chronic ulcer of other part of left lower leg with fat layer exposed E11.621 Type 2 diabetes mellitus with foot ulcer L97.522 Non-pressure chronic ulcer of other part of left foot with fat layer exposed I50.42 Chronic combined systolic (congestive) and diastolic (congestive) heart failure I89.0 Lymphedema, not elsewhere classified 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 I10 Essential (primary) hypertension I73.89 Other specified peripheral vascular diseases Mumme, Ryan [name] ([id number]) [id number] Page 9 of 9 N18.30 Chronic kidney disease, stage 3 unspecified Facility Procedures : CPT4 Code: 50093818 Description: 11042 - DEB SUBQ TISSUE 20 SQ CM/< ICD-10 Diagnosis Description L97.822 Non-pressure chronic ulcer of other part of left lower leg with fat layer expo Modifier: sed Quantity: 1 Physician Procedures : CPT4 Code Description Modifier [id number] 11042 - WC PHYS SUBQ TISS 20 SQ CM ICD-10 Diagnosis Description L97.822 Non-pressure chronic ulcer of other part of  left lower leg with fat layer exposed Quantity: 1 Electronic Signature(s) Signed: 07/05/2023 8:23:53 AM By: [name] PA-C Entered By: [name] on 07/05/2023 08:23:53

## 2023-07-05 NOTE — Telephone Encounter (Signed)
The paperwork is in the providers folder to be reviewed and signed.

## 2023-07-05 NOTE — Telephone Encounter (Signed)
Forms completed

## 2023-07-05 NOTE — Progress Notes (Addendum)
KEATS, KINGRY [name] ([id number]) [id number] Page 1 of 10 Visit Report for 07/05/2023 Arrival Information Details Patient Name: Date of Service: Ryan [name], Ryan [name]. 07/05/2023 7:45 [name] Medical Record Number: [id number] Patient Account Number: [id number] Date of Birth/Sex: Treating RN: 1971/06/20 (52 y.o. Ryan [name] Primary Care Ryan [name]: Ryan [name] Other Clinician: Referring Ryan [name]: Treating Ryan [name]/Extender: Ryan [name] in Treatment: 11 Visit Information History Since Last Visit Added or deleted any medications: No Patient Arrived: Ambulatory Any new allergies or adverse reactions: No Arrival Time: 07:46 Pain Present Now: No Accompanied By: self Transfer Assistance: None Patient Identification Verified: Yes Secondary Verification Process Completed: Yes Patient Requires Transmission-Based Precautions: No Patient Has Alerts: Yes Patient Alerts: Patient on Blood Thinner diabetic Electronic Signature(s) Signed: 07/05/2023 5:02:23 PM By: Ryan Aver MSN RN CNS WTA Entered By: Ryan [name] on 07/05/2023 07:52:41 -------------------------------------------------------------------------------- Compression Therapy Details Patient Name: Date of Service: Ryan [name]. 07/05/2023 7:45 [name] Medical Record Number: [id number] Patient Account Number: [id number] Date of Birth/Sex: Treating RN: 09-05-1970 (52 y.o. Ryan [name] Primary Care Ryan [name]: Ryan [name] Other Clinician: Referring Ryan [name]: Treating Kenney Going/Extender: Ryan [name] in Treatment: 11 Compression Therapy Performed for Wound Assessment: Wound #3 Right,Anterior Lower Leg Performed By: Clinician Ryan Aver, RN Compression Type: Four Layer Post Procedure Diagnosis Same as Pre-procedure Electronic Signature(s) Signed: 07/05/2023 5:02:23 PM By: Ryan Aver MSN RN CNS WTA Entered By: Ryan [name] on 07/05/2023 08:31:56 [place], Bing Neighbors  ([id number]) [id number] Page 2 of 10 -------------------------------------------------------------------------------- Compression Therapy Details Patient Name: Date of Service: Ryan [name], Ryan [name]. 07/05/2023 7:45 [name] Medical Record Number: [id number] Patient Account Number: [id number] Date of Birth/Sex: Treating RN: [birth year] (52 y.o. Ryan [name] Primary Care Ryan [name]: Ryan [name] Other Clinician: Referring Ryan [name]: Treating Ryan [name]: Ryan [name] in Treatment: 11 Compression Therapy Performed for Wound Assessment: Non-Wound Location Performed By: Clinician Ryan Aver, RN Compression Type: Four Layer Location: Lower Extremity, Left Post Procedure Diagnosis Same as Pre-procedure Electronic Signature(s) Signed: 07/05/2023 5:02:23 PM By: Ryan Aver MSN RN CNS WTA Entered By: Ryan [name] on 07/05/2023 08:32:18 -------------------------------------------------------------------------------- Encounter Discharge Information Details Patient Name: Date of Service: Ryan [name]. 07/05/2023 7:45 [name] Medical Record Number: [id number] Patient Account Number: [id number] Date of Birth/Sex: Treating RN: December 19, 1970 (52 y.o. Ryan [name] Primary Care Ryan [name]: Ryan [name] Other Clinician: Referring Ryan [name]: Treating Ryan [name]/Extender: Ryan [name] in Treatment: 11 Encounter Discharge Information Items Post Procedure Vitals Discharge Condition: Stable Temperature (F): 97.9 Ambulatory Status: Ambulatory Pulse (bpm): 76 Discharge Destination: Home Respiratory Rate (breaths/min): 18 Transportation: Private Auto Blood Pressure (mmHg): 136/92 Accompanied By: self Schedule Follow-up Appointment: Yes Clinical Summary of Care: Electronic Signature(s) Signed: 07/05/2023 5:02:23 PM By: Ryan Aver MSN RN CNS WTA Entered By: Ryan [name] on 07/05/2023 08:36:19 [place], Bing Neighbors ([id number])  [id number] Page 3 of 10 -------------------------------------------------------------------------------- Lower Extremity Assessment Details Patient Name: Date of Service: Ryan [name] [name]. 07/05/2023 7:45 [name] Medical Record Number: [id number] Patient Account Number: [id number] Date of Birth/Sex: Treating RN: 07-04-1971 (52 y.o. Ryan [name] Primary Care Ryan [name]: Ryan [name] Other Clinician: Referring Ryan [name]: Treating Ryan [name]: Ryan [name] in Treatment: 11 Edema Assessment Assessed: Ryan [name]: Yes] Ryan [name]: Yes] [Left: Edema] [Right: :] Calf Left: Right: Point of Measurement: 36 cm From Medial Instep 53 cm 59 cm Ankle Left: Right: Point of Measurement: 10 cm From Medial Instep 32 cm 35 cm Knee To Floor Left: Right: From Medial Instep 42 cm 42  cm Vascular Assessment Pulses: Dorsalis Pedis Palpable: [Left:Yes] [Right:Yes] Extremity colors, hair growth, and conditions: Extremity Color: [Left:Hyperpigmented] [Right:Hyperpigmented] Hair Growth on Extremity: [Left:No] [Right:No] Temperature of Extremity: [Left:Warm] [Right:Warm] Capillary Refill: [Left:< 3 seconds] [Right:< 3 seconds] Dependent Rubor: [Left:No] [Right:No] Blanched when Elevated: [Left:No No] [Right:No No] Toe Nail Assessment Left: Right: Thick: Yes Yes Discolored: Yes Yes Deformed: Yes Yes Improper Length and Hygiene: Yes Yes Electronic Signature(s) Signed: 07/05/2023 5:02:23 PM By: Ryan Aver MSN RN CNS WTA Entered By: Ryan [name] on 07/05/2023 08:09:50 [place], Bing Neighbors ([id number]) [id number] Page 4 of 10 -------------------------------------------------------------------------------- Multi Wound Chart Details Patient Name: Date of Service: Ryan [name], Ryan [name]. 07/05/2023 7:45 [name] Medical Record Number: [id number] Patient Account Number: [id number] Date of Birth/Sex: Treating RN: [birth year] (51 y.o. Ryan [name] Primary Care Ryan [name]: Ryan [name] Other Clinician: Referring Ryan [name]: Treating Ryan [name]: Ryan [name] in Treatment: 11 Vital Signs Height(in): 67 Pulse(bpm): 76 Weight(lbs): 338 Blood Pressure(mmHg): 136/92 Body Mass Index(BMI): 52.9 Temperature(F): 97.9 Respiratory Rate(breaths/min): 18 [1:Photos:] [N/[name]:N/[name]] Left, Medial Lower Leg Right, Anterior Lower Leg N/[name] Wound Location: Gradually Appeared Gradually Appeared N/[name] Wounding Event: Diabetic Wound/Ulcer of the Lower Diabetic Wound/Ulcer of the Lower N/[name] Primary Etiology: Extremity Extremity Arrhythmia, Congestive Heart Failure, Arrhythmia, Congestive Heart Failure, N/[name] Comorbid History: Hypertension, Peripheral Venous Hypertension, Peripheral Venous Disease, Type II Diabetes Disease, Type II Diabetes 02/28/2023 06/21/2023 N/[name] Date Acquired: 11 1 N/[name] Weeks of Treatment: Healed - Epithelialized Open N/[name] Wound Status: No No N/[name] Wound Recurrence: 0x0x0 0.7x0.8x0.1 N/[name] Measurements L x W x D (cm) 0 0.44 N/[name] [name] (cm) : rea 0 0.044 N/[name] Volume (cm) : 99.80% 0.00% N/[name] % Reduction in [name] rea: 99.90% 50.00% N/[name] % Reduction in Volume: Grade 2 Grade 1 N/[name] Classification: Medium Medium N/[name] Exudate [name] mount: Serosanguineous N/[name] N/[name] Exudate Type: red, brown N/[name] N/[name] Exudate Color: None Present (0%) Medium (34-66%) N/[name] Granulation [name] mount: N/[name] Red N/[name] Granulation Quality: Large (67-100%) Medium (34-66%) N/[name] Necrotic [name] mount: Eschar Adherent Slough N/[name] Necrotic Tissue: Fat Layer (Subcutaneous Tissue): Yes Fat Layer (Subcutaneous Tissue): Yes N/[name] Exposed Structures: Fascia: No Fascia: No Tendon: No Tendon: No Muscle: No Muscle: No Joint: No Joint: No Bone: No Bone: No Large (67-100%) None N/[name] Epithelialization: Treatment Notes Electronic Signature(s) Signed: 07/05/2023 5:02:23 PM By: Ryan Aver MSN RN CNS WTA Entered By: Ryan [name] on 07/05/2023 08:09:54 [place],  Bing Neighbors ([id number]) [id number] Page 5 of 10 -------------------------------------------------------------------------------- Multi-Disciplinary Care Plan Details Patient Name: Date of Service: Ryan [name], Ryan [name]. 07/05/2023 7:45 [name] Medical Record Number: [id number] Patient Account Number: [id number] Date of Birth/Sex: Treating RN: Sep 18, 1970 (52 y.o. Ryan [name] Primary Care Alpha Mysliwiec: Ryan [name] Other Clinician: Referring Braedyn Riggle: Treating Jayley Hustead/Extender: Ryan [name] in Treatment: 11 Active Inactive Necrotic Tissue Nursing Diagnoses: Knowledge deficit related to management of necrotic/devitalized tissue Goals: Patient/caregiver will verbalize understanding of reason and process for debridement of necrotic tissue Date Initiated: 04/18/2023 Target Resolution Date: 07/19/2023 Goal Status: Active Interventions: Assess patient pain level pre-, during and post procedure and prior to discharge Notes: Wound/Skin Impairment Nursing Diagnoses: Knowledge deficit related to ulceration/compromised skin integrity Goals: Patient/caregiver will verbalize understanding of skin care regimen Date Initiated: 04/18/2023 Date Inactivated: 05/26/2023 Target Resolution Date: 05/19/2023 Goal Status: Met Ulcer/skin breakdown will have [name] volume reduction of 30% by week 4 Date Initiated: 04/18/2023 Date Inactivated: 05/26/2023 Target Resolution Date: 05/19/2023 Goal Status: Met Ulcer/skin breakdown will have [name] volume reduction of 50% by week 8 Date Initiated: 04/18/2023 Date  Inactivated: 06/07/2023 Target Resolution Date: 06/18/2023 Goal Status: Met Ulcer/skin breakdown will have [name] volume reduction of 80% by week 12 Date Initiated: 04/18/2023 Target Resolution Date: 07/19/2023 Goal Status: Active Ulcer/skin breakdown will heal within 14 weeks Date Initiated: 04/18/2023 Target Resolution Date: 08/18/2023 Goal Status:  Active Interventions: Assess patient/caregiver ability to obtain necessary supplies Assess patient/caregiver ability to perform ulcer/skin care regimen upon admission and as needed Assess ulceration(s) every visit Notes: Electronic Signature(s) Signed: 07/05/2023 5:02:23 PM By: Ryan Aver MSN RN CNS WTA Entered By: Ryan [name] on 07/05/2023 08:33:20 [place], Bing Neighbors ([id number]) [id number] Page 6 of 10 -------------------------------------------------------------------------------- Pain Assessment Details Patient Name: Date of Service: Ryan [name], Ryan [name]. 07/05/2023 7:45 [name] Medical Record Number: [id number] Patient Account Number: [id number] Date of Birth/Sex: Treating RN: 06/19/1971 (52 y.o. Ryan [name] Primary Care Marquon Alcala: Ryan [name] Other Clinician: Referring Aadi Bordner: Treating Monquie Fulgham/Extender: Ryan [name] in Treatment: 11 Active Problems Location of Pain Severity and Description of Pain Patient Has Paino No Site Locations Pain Management and Medication Current Pain Management: Electronic Signature(s) Signed: 07/05/2023 5:02:23 PM By: Ryan Aver MSN RN CNS WTA Entered By: Ryan [name] on 07/05/2023 07:53:04 -------------------------------------------------------------------------------- Patient/Caregiver Education Details Patient Name: Date of Service: Ryan [name]. 11/5/2024andnbsp7:45 [name] Medical Record Number: [id number] Patient Account Number: [id number] Date of Birth/Gender: Treating RN: September 05, 1970 (52 y.o. Ryan [name] Primary Care Physician: Ryan [name] Other Clinician: Referring Physician: Treating Physician/Extender: Ryan [name] in Treatment: [address], [place] [name] ([id number]) [id number] Page 7 of 10 Education Assessment Education Provided To: Patient Education Topics Provided Wound/Skin Impairment: Handouts: Caring for Your Ulcer Methods:  Explain/Verbal Responses: State content correctly Electronic Signature(s) Signed: 07/05/2023 5:02:23 PM By: Ryan Aver MSN RN CNS WTA Entered By: Ryan [name] on 07/05/2023 08:33:30 -------------------------------------------------------------------------------- Wound Assessment Details Patient Name: Date of Service: Ryan [name]. 07/05/2023 7:45 [name] Medical Record Number: [id number] Patient Account Number: [id number] Date of Birth/Sex: Treating RN: 19-Jul-1971 (52 y.o. Ryan [name] Primary Care Zaide Kardell: Ryan [name] Other Clinician: Referring Amear Strojny: Treating Jomar Denz/Extender: Ryan [name] in Treatment: 11 Wound Status Wound Number: 1 Primary Diabetic Wound/Ulcer of the Lower Extremity Etiology: Wound Location: Left, Medial Lower Leg Wound Healed - Epithelialized Wounding Event: Gradually Appeared Status: Date Acquired: 02/28/2023 Comorbid Arrhythmia, Congestive Heart Failure, Hypertension, Peripheral Weeks Of Treatment: 11 History: Venous Disease, Type II Diabetes Clustered Wound: No Photos Wound Measurements Length: (cm) Width: (cm) Depth: (cm) Area: (cm) Volume: (cm) 0 % Reduction in Area: 99.8% 0 % Reduction in Volume: 99.9% 0 Epithelialization: Large (67-100%) 0 0 Wound Description Classification: Grade 2 Exudate Amount: Medium Exudate Type: Serosanguineous Ryan [name], Ryan [name] ([id number]) Exudate Color: red, brown Foul Odor After Cleansing: No Slough/Fibrino Yes [id number] Page 8 of 10 Wound Bed Granulation Amount: None Present (0%) Exposed Structure Necrotic Amount: Large (67-100%) Fascia Exposed: No Necrotic Quality: Eschar Fat Layer (Subcutaneous Tissue) Exposed: Yes Tendon Exposed: No Muscle Exposed: No Joint Exposed: No Bone Exposed: No Treatment Notes Wound #1 (Lower Leg) Wound Laterality: Left, Medial Cleanser Peri-Wound Care Topical Primary Dressing Secondary Dressing Secured  With Compression Wrap Compression Stockings Add-Ons Electronic Signature(s) Signed: 07/05/2023 5:02:23 PM By: Ryan Aver MSN RN CNS WTA Entered By: Ryan [name] on 07/05/2023 08:09:08 -------------------------------------------------------------------------------- Wound Assessment Details Patient Name: Date of Service: Ryan [name]. 07/05/2023 7:45 [name] Medical Record Number: [id number] Patient Account Number: [id number] Date of Birth/Sex: Treating RN: [birth year] (52 y.o. Ryan [name] Primary Care Rut Betterton: Ryan [name]  Other Clinician: Referring Ilyaas Musto: Treating Lisset Ketchem/Extender: Ryan [name] in Treatment: 11 Wound Status Wound Number: 3 Primary Diabetic Wound/Ulcer of the Lower Extremity Etiology: Wound Location: Right, Anterior Lower Leg Wound Open Wounding Event: Gradually Appeared Status: Date Acquired: 06/21/2023 Comorbid Arrhythmia, Congestive Heart Failure, Hypertension, Peripheral Weeks Of Treatment: 1 History: Venous Disease, Type II Diabetes Clustered Wound: No Photos Ryan [name], Ryan [name] ([id number]) [id number] Page 9 of 10 Wound Measurements Length: (cm) 0.7 Width: (cm) 0.8 Depth: (cm) 0.1 Area: (cm) 0.44 Volume: (cm) 0.044 % Reduction in Area: 0% % Reduction in Volume: 50% Epithelialization: None Wound Description Classification: Grade 1 Exudate Amount: Medium Foul Odor After Cleansing: No Slough/Fibrino Yes Wound Bed Granulation Amount: Medium (34-66%) Exposed Structure Granulation Quality: Red Fascia Exposed: No Necrotic Amount: Medium (34-66%) Fat Layer (Subcutaneous Tissue) Exposed: Yes Necrotic Quality: Adherent Slough Tendon Exposed: No Muscle Exposed: No Joint Exposed: No Bone Exposed: No Treatment Notes Wound #3 (Lower Leg) Wound Laterality: Right, Anterior Cleanser Soap and Water Discharge Instruction: Gently cleanse wound with antibacterial soap, rinse and pat dry prior to  dressing wounds Wound Cleanser Discharge Instruction: Wash your hands with soap and water. Remove old dressing, discard into plastic bag and place into trash. Cleanse the wound with Wound Cleanser prior to applying [name] clean dressing using gauze sponges, not tissues or cotton balls. Do not scrub or use excessive force. Pat dry using gauze sponges, not tissue or cotton balls. Peri-Wound Care Topical Primary Dressing Hydrofera Blue Ready Transfer Foam, 2.5x2.5 (in/in) Discharge Instruction: Apply Hydrofera Blue Ready to wound bed as directed Secondary Dressing ABD Pad 5x9 (in/in) Discharge Instruction: Cover with ABD pad Secured With Compression Wrap Urgo K2, two layer compression system, large Compression Stockings Add-Ons Electronic Signature(s) Signed: 07/05/2023 5:02:23 PM By: Ryan Aver MSN RN CNS WTA Entered By: Ryan [name] on 07/05/2023 08:02:48 [place], Bing Neighbors ([id number]) [id number] Page 10 of 10 -------------------------------------------------------------------------------- Vitals Details Patient Name: Date of Service: Ryan [name], Ryan [name]. 07/05/2023 7:45 [name] Medical Record Number: [id number] Patient Account Number: [id number] Date of Birth/Sex: Treating RN: 11/12/1970 (52 y.o. Ryan [name] Primary Care Belynda Pagaduan: Ryan [name] Other Clinician: Referring Isreal Moline: Treating Shaquan Missey/Extender: Ryan [name] in Treatment: 11 Vital Signs Time Taken: 07:52 Temperature (F): 97.9 Height (in): 67 Pulse (bpm): 76 Weight (lbs): 338 Respiratory Rate (breaths/min): 18 Body Mass Index (BMI): 52.9 Blood Pressure (mmHg): 136/92 Reference Range: 80 - 120 mg / dl Electronic Signature(s) Signed: 07/05/2023 5:02:23 PM By: Ryan Aver MSN RN CNS WTA Entered By: Ryan [name] on 07/05/2023 07:52:59

## 2023-07-05 NOTE — Telephone Encounter (Signed)
Patient called stated his paperwork is due back on 07/07/23 and he needs it completed asap so he can come sign them before they are faxed back to his employer. Please f/u with patient

## 2023-07-06 LAB — DIGOXIN LEVEL: Digoxin, Serum: <0.4 ng/mL — ABNORMAL LOW (ref 0.5–0.9)

## 2023-07-07 NOTE — Addendum Note (Signed)
Encounter addended by: Linnell Fulling on: 07/07/2023 10:14 AM  Actions taken: Imaging Exam ended

## 2023-07-11 ENCOUNTER — Encounter: Payer: No Typology Code available for payment source | Admitting: Cardiology

## 2023-07-12 ENCOUNTER — Encounter: Payer: No Typology Code available for payment source | Admitting: Physician Assistant

## 2023-07-12 DIAGNOSIS — E11622 Type 2 diabetes mellitus with other skin ulcer: Secondary | ICD-10-CM | POA: Diagnosis not present

## 2023-07-12 NOTE — Progress Notes (Addendum)
Ryan [name] ([id number]) [id number] Page 1 of 8 Visit Report for 07/12/2023 Chief Complaint Document Details Patient Name: Date of Service: Ryan [name], Ryan [name]. 07/12/2023 8:00 [name] Medical Record Number: [id number] Patient Account Number: [id number] Date of Birth/Sex: Treating RN: 1971/01/08 (52 y.o. Ryan [name]) Ryan [name] Primary Care Provider: Larae [name] Other Clinician: Referring Provider: Treating Provider/Extender: Ryan [name] in Treatment: 12 Information Obtained from: Patient Chief Complaint Left leg and left foot ulcers Electronic Signature(s) Signed: 07/12/2023 8:13:31 AM By: Ryan Derry PA-C Entered By: Ryan [name] on 07/12/2023 08:13:31 -------------------------------------------------------------------------------- HPI Details Patient Name: Date of Service: Ryan [name]. 07/12/2023 8:00 [name] Medical Record Number: [id number] Patient Account Number: [id number] Date of Birth/Sex: Treating RN: [birth year] (52 y.o. Ryan [name] Primary Care Provider: Larae [name] Other Clinician: Referring Provider: Treating Provider/Extender: Ryan [name] in Treatment: 12 History of Present Illness Chronic/Inactive Conditions Condition 1: 04-18-2023 patient screening ABI today was 0.83 and this was on the left. With that being said he had previous ABIs done formally on 06-25-2021 with [name] right ABI of 1.12 and [name] TBI of 1.03 on the left she had an ABI of 1.12 with [name] TBI of 0.98. HPI Description: 04-18-2023 upon evaluation today patient presents for initial inspection here in our clinic some concerning issues he is having with his left proximal foot as well as his left lower extremity. This actually appears to be more of Ryan [name] type situation to be honest. He does have chronic lower extremity swelling secondary to what appears to be venous stasis he has not really had any wounds significantly before and tells  me that it has been [name] probably even 12 or so since he has last used his [name] pumps. He is not even sure if they are actually functioning anymore or not. With that being said this is 1 thing that I did want him to look into. He is on [name] fluid pill and he does not wear compression socks on [name] regular basis although I definitely think that something that he also really needs to be doing to be honest. He does not have any injury that occurred to cause these wounds they more or less spontaneously occurred as [name] result of what appears to be excessive swelling. Patient does have [name] history of diabetes mellitus type 2, congestive heart failure, [name], venous hypertension, hypertension, peripheral vascular disease, and chronic kidney disease stage III. 8/26; this is [name] patient with chronic [name]. He has wounds on the left medial lower extremity and left medial foot. We have been using Hydrofera Blue and Urgo K2. He tells me he has [name] compression pump at home although it is missing apart and he is not really familiar with with which company to call. He also started on furosemide 20 mg every other day probably would do better with every day and he is talking to his physician about this. [name], [name] ([id number]) [id number] Page 2 of 8 05-05-2023 upon evaluation today patient appears to be doing pretty well currently in regard to his wound. Fortunately there does not appear to be any signs of infection he does have some slough and biofilm noted I did discuss with the patient today I do believe that he would benefit from [name] continuation of therapy with regard to his wound. I think we may need to increase the compression however and also think that he may benefit from targeted debridement today. 05-09-2023 upon evaluation today patient  appears to be doing well currently in regard to his wound on the leg. He and both locations seems to be showing signs of  improvement which is great news and in general I do believe there were making excellent headway towards complete closure which is great news. I do not see any signs of active infection at this time which is also excellent news. 05-16-2023 upon evaluation today patient appears to be doing well currently with regard to his wound we did actually have [name] check at this today but that is not the biggest thing I am concerned about at this point. He unfortunately is having some issues here with oxygen saturation dropping to around 84% times and then bouncing back up and down he is breathing rapidly he is also fluctuating with regard to pulse anywhere from 88 that I got manually to it at some points even up at 110 his body does seem to be compensating. He tells me that he sleeps with oxygen but does not wear it during the day typically he has been short of breath for [name] little under [name] week. Shortly after he was here last week. With that being said the patient tells me that he does have [name] history of pulmonary embolisms. He also tells me that he has been having some issues catching his breath with ambulation as well which is also [name] big part of the issue at this point. Fortunately I do not see any signs of obvious infection and is listening to him I could not tell any abnormal breath sounds although he may be [name] little bit restricted although I cannot be for sure. He tells me that the only thing he may have had [name] little bit of chills but nothing else significant. Nonetheless I think he needs to be evaluated at the ER ASAP. 05-26-2023 upon evaluation today patient appears to be doing well currently in regard to his wounds which are actually significantly better. He did have [name] hospital course where he just got out yesterday he ended up having pulmonary emboli at multiple in fact I think I read 14 that were present and had to be removed. He is now on additional blood thinners and states that he is in [name] good place as far as  the blood thinners are concerned. He is feeling much better as well able to breathe quite nicely. I am very pleased with where things stand currently and do not see any signs of infection at this time which is also good news. 05-31-2023 upon evaluation today patient appears to be doing well with regard to his wound one of them is healed the other is getting close. Unfortunately he is having issues again with his breathing today oxygen saturation around 87 to 90% in general during the evaluation at this point. This has me concerned again as he had previous pulmonary emboli does have an appointment with pulmonology tomorrow but I do not think he needs to wait till tomorrow. 06-07-2023 upon evaluation today patient appears to be doing well currently in regard to his wound. He is tolerating the dressing changes this looks to be doing better in general I think removing in the right direction. I do not see any signs of active infection at this time. No fevers, chills, nausea, vomiting, or diarrhea. 06-28-2023 upon evaluation today patient appears to be doing [name] little worse in regard to his wounds due to the fact that unfortunately this has broken down after he was in the hospital most recently.  Again this is the biggest concern that I have at this point. I do believe that he is going to have to go back into compression to get these under control and that he is gena need ongoing compression stockings. 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 His oxygen saturation remains low even today in the clinic with just sitting and not even talking he was bouncing between 88 and 90 I am not so sure he should not have oxygen 24/7 at least at low levels. 07-05-2023 upon evaluation today patient appears to be doing well currently in regard to his wound. In fact the left leg is doing significantly better in fact appears to be healed. The right leg is improving but at the same time is not completely healed as of yet. I  fortunately do not see any signs of active infection locally or systemically at this time. No fevers, chills, nausea, vomiting, or diarrhea. 07-12-2023 upon evaluation today patient unfortunately comes in today his wrap side slid down quite significantly on his legs. Fortunately I do not see any signs of infection but at the same time the wound on the right leg is still open at this point we are working on ordering the juxta fit compression wraps. Electronic Signature(s) Signed: 07/12/2023 8:32:28 AM By: Ryan Derry PA-C Entered By: Ryan [name] on 07/12/2023 08:32:28 -------------------------------------------------------------------------------- Physical Exam Details Patient Name: Date of Service: Ryan [name]. 07/12/2023 8:00 [name] Medical Record Number: [id number] Patient Account Number: [id number] Date of Birth/Sex: Treating RN: 1971-08-16 (52 y.o. Ryan [name] Primary Care Provider: Larae [name] Other Clinician: Referring Provider: Treating Provider/Extender: Ryan [name] in Treatment: 12 Constitutional Obese and well-hydrated in no acute distress. Respiratory normal breathing without difficulty. Psychiatric this patient is able to make decisions and demonstrates good insight into disease process. Alert and Oriented x 3. pleasant and cooperative. Notes Ryan [name], Ryan [name] ([id number]) [id number] Page 3 of 8 Upon inspection patient's wound bed actually showed signs of good granulation and epithelization at this point. Fortunately there does not appear to be any signs of active infection. He has been tolerating the dressing changes without complication. With that being said he continues to have issues with his [name] at this point. I think that compression pumps/[name] pumps would be appropriate for him. Electronic Signature(s) Signed: 07/12/2023 8:32:51 AM By: Ryan Derry PA-C Entered By: Ryan [name] on 07/12/2023  08:32:51 -------------------------------------------------------------------------------- Physician Orders Details Patient Name: Date of Service: Ryan [name]. 07/12/2023 8:00 [name] Medical Record Number: [id number] Patient Account Number: [id number] Date of Birth/Sex: Treating RN: July 30, 1971 (52 y.o. Ryan [name]) Ryan [name] Primary Care Provider: Larae [name] Other Clinician: Referring Provider: Treating Provider/Extender: Ryan [name] in Treatment: 12 The following information was scribed by: Ryan [name] The information was scribed for: Ryan [name] Verbal / Phone Orders: No Diagnosis Coding ICD-10 Coding Code Description E11.622 Type 2 diabetes mellitus with other skin ulcer L97.822 Non-pressure chronic ulcer of other part of left lower leg with fat layer exposed E11.621 Type 2 diabetes mellitus with foot ulcer L97.522 Non-pressure chronic ulcer of other part of left foot with fat layer exposed I50.42 Chronic combined systolic (congestive) and diastolic (congestive) heart failure I89.0 [name], not elsewhere classified 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 Bathing/ Shower/ Hygiene May shower with wound dressing protected  with water repellent cover or cast protector. Anesthetic (Use 'Patient Medications' Section for Anesthetic Order Entry) Lidocaine applied to wound bed Edema Control - Orders / Instructions UrgoK2 - left lower leg Elevate, Exercise Daily and [name] void Standing for Long Periods of Time. Elevate legs to the level of the heart and pump ankles as often as possible Elevate leg(s) parallel to the floor when sitting. Other: - Order Juxta Fit Wound Treatment Wound #3 - Lower Leg Wound  Laterality: Right, Anterior Cleanser: Soap and Water 1 x Per Week/30 Days Discharge Instructions: Gently cleanse wound with antibacterial soap, rinse and pat dry prior to dressing wounds Cleanser: Wound Cleanser 1 x Per Week/30 Days Discharge Instructions: Wash your hands with soap and water. Remove old dressing, discard into plastic bag and place into trash. Cleanse the wound with Wound Cleanser prior to applying [name] clean dressing using gauze sponges, not tissues or cotton balls. Do not scrub or use excessive force. Pat dry using gauze sponges, not tissue or cotton balls. Ryan [name], Ryan [name] ([id number]) [id number] Page 4 of 8 Prim Dressing: Hydrofera Blue Ready Transfer Foam, 2.5x2.5 (in/in) 1 x Per Week/30 Days ary Discharge Instructions: Apply Hydrofera Blue Ready to wound bed as directed Secondary Dressing: ABD Pad 5x9 (in/in) 1 x Per Week/30 Days Discharge Instructions: Cover with ABD pad Compression Wrap: Urgo K2, two layer compression system, large 1 x Per Week/30 Days Electronic Signature(s) Signed: 07/13/2023 4:42:48 PM By: Ryan Pax RN Signed: 07/14/2023 7:57:50 AM By: Ryan Derry PA-C Previous Signature: 07/12/2023 8:57:03 AM Version By: Ryan Pax RN Entered By: Ryan [name] on 07/12/2023 09:14:10 -------------------------------------------------------------------------------- Problem List Details Patient Name: Date of Service: Ryan [name]. 07/12/2023 8:00 [name] Medical Record Number: [id number] Patient Account Number: [id number] Date of Birth/Sex: Treating RN: 08/05/1971 (52 y.o. Lina Sar, [name] Primary Care Provider: Larae [name] Other Clinician: Referring Provider: Treating Provider/Extender: Ryan [name] in Treatment: 12 Active Problems ICD-10 Encounter Code Description Active Date MDM Diagnosis E11.622 Type 2 diabetes mellitus with other skin ulcer 04/18/2023 No Yes L97.822 Non-pressure chronic ulcer of other  part of left lower leg with fat layer exposed8/19/2024 No Yes E11.621 Type 2 diabetes mellitus with foot ulcer 04/18/2023 No Yes L97.522 Non-pressure chronic ulcer of other part of left foot with fat layer exposed 04/18/2023 No Yes I50.42 Chronic combined systolic (congestive) and diastolic (congestive) heart failure 04/18/2023 No Yes I89.0 [name], not elsewhere classified 04/18/2023 No Yes I87.332 Chronic venous hypertension (idiopathic) with ulcer and inflammation of left 04/18/2023 No Yes lower extremity I10 Essential (primary) hypertension 04/18/2023 No Yes Ryan [name], Ryan [name] ([id number]) [id number] Page 5 of 8 I73.89 Other specified peripheral vascular diseases 04/18/2023 No Yes N18.30 Chronic kidney disease, stage 3 unspecified 04/18/2023 No Yes Inactive Problems Resolved Problems Electronic Signature(s) Signed: 07/12/2023 8:13:28 AM By: Ryan Derry PA-C Entered By: Ryan [name] on 07/12/2023 08:13:28 -------------------------------------------------------------------------------- Progress Note Details Patient Name: Date of Service: Ryan [name]. 07/12/2023 8:00 [name] Medical Record Number: [id number] Patient Account Number: [id number] Date of Birth/Sex: Treating RN: 08/07/1971 (52 y.o. Ryan [name] Primary Care Provider: Larae [name] Other Clinician: Referring Provider: Treating Provider/Extender: Ryan [name] in Treatment: 12 Subjective Chief Complaint Information obtained from Patient Left leg and left foot ulcers History of Present Illness (HPI) Chronic/Inactive Condition: 04-18-2023 patient screening ABI today was 0.83 and this was on the left. With that being said he had previous ABIs done formally on 06-25-2021 with [name] right ABI of 1.12 and [name]  TBI of 1.03 on the left she had an ABI of 1.12 with [name] TBI of 0.98. 04-18-2023 upon evaluation today patient presents for initial inspection here in our clinic some concerning issues  he is having with his left proximal foot as well as his left lower extremity. This actually appears to be more of Ryan [name] type situation to be honest. He does have chronic lower extremity swelling secondary to what appears to be venous stasis he has not really had any wounds significantly before and tells me that it has been [name] probably even 12 or so since he has last used his [name] pumps. He is not even sure if they are actually functioning anymore or not. With that being said this is 1 thing that I did want him to look into. He is on [name] fluid pill and he does not wear compression socks on [name] regular basis although I definitely think that something that he also really needs to be doing to be honest. He does not have any injury that occurred to cause these wounds they more or less spontaneously occurred as [name] result of what appears to be excessive swelling. Patient does have [name] history of diabetes mellitus type 2, congestive heart failure, [name], venous hypertension, hypertension, peripheral vascular disease, and chronic kidney disease stage III. 8/26; this is [name] patient with chronic [name]. He has wounds on the left medial lower extremity and left medial foot. We have been using Hydrofera Blue and Urgo K2. He tells me he has [name] compression pump at home although it is missing apart and he is not really familiar with with which company to call. He also started on furosemide 20 mg every other day probably would do better with every day and he is talking to his physician about this. 05-05-2023 upon evaluation today patient appears to be doing pretty well currently in regard to his wound. Fortunately there does not appear to be any signs of infection he does have some slough and biofilm noted I did discuss with the patient today I do believe that he would benefit from [name] continuation of therapy with regard to his wound. I think we may need to increase the compression however and  also think that he may benefit from targeted debridement today. 05-09-2023 upon evaluation today patient appears to be doing well currently in regard to his wound on the leg. He and both locations seems to be showing signs of improvement which is great news and in general I do believe there were making excellent headway towards complete closure which is great news. I do not see any signs of active infection at this time which is also excellent news. 05-16-2023 upon evaluation today patient appears to be doing well currently with regard to his wound we did actually have [name] check at this today but that is not the biggest thing I am concerned about at this point. He unfortunately is having some issues here with oxygen saturation dropping to around 84% times and then bouncing back up and down he is breathing rapidly he is also fluctuating with regard to pulse anywhere from 88 that I got manually to it at some points even up at 110 his body does seem to be compensating. He tells me that he sleeps with oxygen but does not wear it during the day typically he has been short of breath for [name] little under [name] week. Shortly after he was here last week. With that being said the patient  tells me that he does have [name] history of pulmonary Ryan [name], Ryan [name] ([id number]) [id number] Page 6 of 8 embolisms. He also tells me that he has been having some issues catching his breath with ambulation as well which is also [name] big part of the issue at this point. Fortunately I do not see any signs of obvious infection and is listening to him I could not tell any abnormal breath sounds although he may be [name] little bit restricted although I cannot be for sure. He tells me that the only thing he may have had [name] little bit of chills but nothing else significant. Nonetheless I think he needs to be evaluated at the ER ASAP. 05-26-2023 upon evaluation today patient appears to be doing well currently in regard to his wounds  which are actually significantly better. He did have [name] hospital course where he just got out yesterday he ended up having pulmonary emboli at multiple in fact I think I read 14 that were present and had to be removed. He is now on additional blood thinners and states that he is in [name] good place as far as the blood thinners are concerned. He is feeling much better as well able to breathe quite nicely. I am very pleased with where things stand currently and do not see any signs of infection at this time which is also good news. 05-31-2023 upon evaluation today patient appears to be doing well with regard to his wound one of them is healed the other is getting close. Unfortunately he is having issues again with his breathing today oxygen saturation around 87 to 90% in general during the evaluation at this point. This has me concerned again as he had previous pulmonary emboli does have an appointment with pulmonology tomorrow but I do not think he needs to wait till tomorrow. 06-07-2023 upon evaluation today patient appears to be doing well currently in regard to his wound. He is tolerating the dressing changes this looks to be doing better in general I think removing in the right direction. I do not see any signs of active infection at this time. No fevers, chills, nausea, vomiting, or diarrhea. 06-28-2023 upon evaluation today patient appears to be doing [name] little worse in regard to his wounds due to the fact that unfortunately this has broken down after he was in the hospital most recently. Again this is the biggest concern that I have at this point. I do believe that he is going to have to go back into compression to get these under control and that he is gena need ongoing compression stockings. 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 His oxygen saturation remains low even today in the clinic with just sitting and not even talking he was bouncing between 88 and 90 I am not so sure he should not have oxygen  24/7 at least at low levels. 07-05-2023 upon evaluation today patient appears to be doing well currently in regard to his wound. In fact the left leg is doing significantly better in fact appears to be healed. The right leg is improving but at the same time is not completely healed as of yet. I fortunately do not see any signs of active infection locally or systemically at this time. No fevers, chills, nausea, vomiting, or diarrhea. 07-12-2023 upon evaluation today patient unfortunately comes in today his wrap side slid down quite significantly on his legs. Fortunately I do not see any signs of infection but at the same time the wound on  the right leg is still open at this point we are working on ordering the juxta fit compression wraps. Objective Constitutional Obese and well-hydrated in no acute distress. Vitals Time Taken: 8:08 AM, Height: 67 in, Weight: 338 lbs, BMI: 52.9, Temperature: 97.9 F, Pulse: 80 bpm, Respiratory Rate: 18 breaths/min, Blood Pressure: 142/96 mmHg. Respiratory normal breathing without difficulty. Psychiatric this patient is able to make decisions and demonstrates good insight into disease process. Alert and Oriented x 3. pleasant and cooperative. General Notes: Upon inspection patient's wound bed actually showed signs of good granulation and epithelization at this point. Fortunately there does not appear to be any signs of active infection. He has been tolerating the dressing changes without complication. With that being said he continues to have issues with his [name] at this point. I think that compression pumps/[name] pumps would be appropriate for him. Integumentary (Hair, Skin) Wound #3 status is Open. Original cause of wound was Gradually Appeared. The date acquired was: 06/21/2023. The wound has been in treatment 2 weeks. The wound is located on the Right,Anterior Lower Leg. The wound measures 1cm length x 0.8cm width x 0.1cm depth; 0.628cm^2 area and  0.063cm^3 volume. There is Fat Layer (Subcutaneous Tissue) exposed. There is no tunneling or undermining noted. There is [name] medium amount of serosanguineous drainage noted. There is medium (34-66%) red granulation within the wound bed. There is [name] medium (34-66%) amount of necrotic tissue within the wound bed including Adherent Slough. 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 Bathing/ Shower/ Hygiene: May shower with wound dressing protected with water repellent cover or cast protector. Anesthetic (Use 'Patient Medications' Section for Anesthetic Order Entry): Lidocaine applied to wound bed Edema Control - Orders / Instructions: Other: - Order Juxta Fit WOUND #3: - Lower Leg Wound Laterality: Right, Anterior Cleanser: Soap and Water 1 x Per Week/30 Days Discharge Instructions: Gently cleanse wound with antibacterial soap, rinse and pat dry prior to dressing wounds Cleanser: Wound Cleanser 1 x Per Week/30 Days Discharge Instructions: Wash your hands with soap and water. Remove old dressing, discard into plastic bag and place into trash. Cleanse the wound with Wound Cleanser prior to applying [name] clean dressing using gauze sponges, not tissues or cotton balls. Do not scrub or use excessive  force. Pat dry using gauze sponges, not tissue or cotton balls. Prim Dressing: Hydrofera Blue Ready Transfer Foam, 2.5x2.5 (in/in) 1 x Per Week/30 Days ary Discharge Instructions: Apply Hydrofera Blue Ready to wound bed as directed Secondary Dressing: ABD Pad 5x9 (in/in) 1 x Per Week/30 Days Discharge Instructions: Cover with ABD pad Com pression Wrap: Urgo K2, two layer compression system, large 1 x Per Week/30 Days 1. Based on what I am seeing I do believe that the patient would benefit currently from [name] pumps. He has been trying to do as much walking as he can for exercise it is difficult because he does have issues with his breathing at times but nonetheless  that is something that he has been focusing on. He does have stage III [name]. We have been compressing him actually since April 18, 2023 and while the compression works he continues to have significant swelling causing issues as far as pushing the wraps down. I think that he needs to have something more and I think [name] pumps would be appropriate. He also does attempt to elevate his legs I told him that doing this is much as possible is going to be the ideal thing going forward as well. We Are going to see about ordering the [name] pumps. 2. I am going to recommend for now that we continue with the bilateral 4-layer equivalent Urgo K2 compression wraps. 3. Were also going to continue with the wound care measures specifically the Hydrofera Blue to the wound has been doing [name] good job for him up to this point. We will see patient back for reevaluation in 1 week here in the clinic. If anything worsens or changes patient will contact our office for additional recommendations. Electronic Signature(s) Signed: 07/12/2023 8:57:42 AM By: Ryan Derry PA-C Previous Signature: 07/12/2023 8:34:32 AM Version By: Ryan Derry PA-C Entered By: Ryan [name] on 07/12/2023  08:57:42 -------------------------------------------------------------------------------- SuperBill Details Patient Name: Date of Service: Ryan [name]. 07/12/2023 Medical Record Number: [id number] Patient Account Number: [id number] Date of Birth/Sex: Treating RN: December 16, 1970 (52 y.o. 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

## 2023-07-12 NOTE — Progress Notes (Deleted)
There were no vitals taken for this visit.   Subjective:    Patient ID: Ryan [name], male    DOB: 1970/09/07, 52 y.o.   MRN: [id number]  HPI: Ryan [name] is a 52 y.o. male  No chief complaint on file.  HYPERTENSION / HYPERLIPIDEMIA Satisfied with current treatment? no Duration of hypertension: years BP monitoring frequency: not checking BP range:  BP medication side effects: no Past BP meds:  lasix and metoprolol and losartan (cozaar) Duration of hyperlipidemia: years Cholesterol medication side effects: no Cholesterol supplements: none Past cholesterol medications: none Medication compliance: excellent compliance Aspirin: no Recent stressors: no Recurrent headaches: no Visual changes: no Palpitations: no Dyspnea: no Chest pain: no Lower extremity edema: yes Dizzy/lightheaded: no  DIABETES Only took mounjaro one time. Hypoglycemic episodes:no Polydipsia/polyuria: no Visual disturbance: no Chest pain: no Paresthesias: no Glucose Monitoring: no  Accucheck frequency: Not Checking  Fasting glucose:  Post prandial:  Evening:  Before meals: Taking Insulin?: no  Long acting insulin:  Short acting insulin: Blood Pressure Monitoring: rarely Retinal Examination: Up to Date Foot Exam: Up to Date Diabetic Education: Not Completed Pneumovax: Up to Date Influenza: Not up to Date Aspirin: no  CHRONIC KIDNEY DISEASE CKD status: controlled Medications renally dose: yes Previous renal evaluation: yes Pneumovax:  Up to Date Influenza Vaccine:  Not up to Date  Patient states he is having shortness of breath at night.    Relevant past medical, surgical, family and social history reviewed and updated as indicated. Interim medical history since our last visit reviewed. Allergies and medications reviewed and updated.  Review of Systems  Eyes:  Negative for visual disturbance.  Respiratory:  Positive for shortness of breath. Negative for chest tightness.    Cardiovascular:  Positive for leg swelling. Negative for chest pain and palpitations.  Endocrine: Negative for polydipsia and polyuria.  Neurological:  Negative for dizziness, light-headedness, numbness and headaches.    Per HPI unless specifically indicated above     Objective:    There were no vitals taken for this visit.  Wt Readings from Last 3 Encounters:  07/04/23 (!) 342 lb 3.2 oz (155.2 kg)  06/23/23 (!) 333 lb 8.9 oz (151.3 kg)  06/02/23 (!) 342 lb (155.1 kg)    Physical Exam Vitals and nursing note reviewed.  Constitutional:      General: He is not in acute distress.    Appearance: Normal appearance. He is obese. He is not ill-appearing, toxic-appearing or diaphoretic.  HENT:     Head: Normocephalic.     Right Ear: External ear normal.     Left Ear: External ear normal.     Nose: Nose normal. No congestion or rhinorrhea.     Mouth/Throat:     Mouth: Mucous membranes are moist.  Eyes:     General:        Right eye: No discharge.        Left eye: No discharge.     Extraocular Movements: Extraocular movements intact.     Conjunctiva/sclera: Conjunctivae normal.     Pupils: Pupils are equal, round, and reactive to light.  Cardiovascular:     Rate and Rhythm: Normal rate and regular rhythm.     Heart sounds: No murmur heard. Pulmonary:     Effort: Pulmonary effort is normal. No respiratory distress.     Breath sounds: Normal breath sounds. No wheezing, rhonchi or rales.  Abdominal:     General: Abdomen is flat. Bowel sounds are normal.  Musculoskeletal:     Cervical back: Normal range of motion and neck supple.     Right lower leg: 1+ Edema present.     Left lower leg: 1+ Edema present.  Skin:    General: Skin is warm and dry.     Capillary Refill: Capillary refill takes less than 2 seconds.  Neurological:     General: No focal deficit present.     Mental Status: He is alert and oriented to person, place, and time.  Psychiatric:        Mood and Affect:  Mood normal.        Behavior: Behavior normal.        Thought Content: Thought content normal.        Judgment: Judgment normal.    Results for orders placed or performed in visit on 07/04/23  CBC w/Diff  Result Value Ref Range   WBC 3.5 3.4 - 10.8 x10E3/uL   RBC 4.79 4.14 - 5.80 x10E6/uL   Hemoglobin 14.5 13.0 - 17.7 g/dL   Hematocrit 19.1 47.8 - 51.0 %   MCV 94 79 - 97 fL   MCH 30.3 26.6 - 33.0 pg   MCHC 32.1 31.5 - 35.7 g/dL   RDW 29.5 62.1 - 30.8 %   Platelets 300 150 - 450 x10E3/uL   Neutrophils 40 Not Estab. %   Lymphs 45 Not Estab. %   Monocytes 11 Not Estab. %   Eos 2 Not Estab. %   Basos 1 Not Estab. %   Neutrophils Absolute 1.4 1.4 - 7.0 x10E3/uL   Lymphocytes Absolute 1.6 0.7 - 3.1 x10E3/uL   Monocytes Absolute 0.4 0.1 - 0.9 x10E3/uL   EOS (ABSOLUTE) 0.1 0.0 - 0.4 x10E3/uL   Basophils Absolute 0.0 0.0 - 0.2 x10E3/uL   Immature Granulocytes 1 Not Estab. %   Immature Grans (Abs) 0.0 0.0 - 0.1 x10E3/uL  Comp Met (CMET)  Result Value Ref Range   Glucose 107 (H) 70 - 99 mg/dL   BUN 17 6 - 24 mg/dL   Creatinine, Ser 6.57 (H) 0.76 - 1.27 mg/dL   eGFR 62 >84 ON/GEX/5.28   BUN/Creatinine Ratio 13 9 - 20   Sodium 143 134 - 144 mmol/L   Potassium 4.8 3.5 - 5.2 mmol/L   Chloride 108 (H) 96 - 106 mmol/L   CO2 16 (L) 20 - 29 mmol/L   Calcium 9.0 8.7 - 10.2 mg/dL   Total Protein 7.6 6.0 - 8.5 g/dL   Albumin 3.9 3.8 - 4.9 g/dL   Globulin, Total 3.7 1.5 - 4.5 g/dL   Bilirubin Total 0.4 0.0 - 1.2 mg/dL   Alkaline Phosphatase 69 44 - 121 IU/L   AST 28 0 - 40 IU/L   ALT 35 0 - 44 IU/L  CoaguChek XS/INR Waived (STAT)  Result Value Ref Range   INR 3.5 (H) 0.9 - 1.1   Prothrombin Time 42.0 sec  Digoxin level  Result Value Ref Range   Digoxin, Serum <0.4 (L) 0.5 - 0.9 ng/mL      Assessment & Plan:   Problem List Items Addressed This Visit   None     Follow up plan: No follow-ups on file.

## 2023-07-13 ENCOUNTER — Ambulatory Visit: Payer: No Typology Code available for payment source | Admitting: Nurse Practitioner

## 2023-07-13 ENCOUNTER — Other Ambulatory Visit: Payer: Self-pay

## 2023-07-13 ENCOUNTER — Ambulatory Visit: Payer: No Typology Code available for payment source | Attending: Cardiology

## 2023-07-13 ENCOUNTER — Other Ambulatory Visit: Payer: Self-pay | Admitting: Physician Assistant

## 2023-07-13 DIAGNOSIS — Z5181 Encounter for therapeutic drug level monitoring: Secondary | ICD-10-CM

## 2023-07-13 DIAGNOSIS — I4891 Unspecified atrial fibrillation: Secondary | ICD-10-CM

## 2023-07-13 DIAGNOSIS — I2699 Other pulmonary embolism without acute cor pulmonale: Secondary | ICD-10-CM

## 2023-07-13 LAB — POCT INR: INR: 2.4 (ref 2.0–3.0)

## 2023-07-13 NOTE — Patient Instructions (Signed)
Take 1.5 tablets today only then continue 1 tablet Daily.  INR in 2 weeks.  A full discussion of the nature of anticoagulants has been carried out.  A benefit risk analysis has been presented to the patient, so that they understand the justification for choosing anticoagulation at this time. The need for frequent and regular monitoring, precise dosage adjustment and compliance is stressed.  Side effects of potential bleeding are discussed.  The patient should avoid any OTC items containing aspirin or ibuprofen, and should avoid great swings in general diet.  Avoid alcohol consumption.  Call if any signs of abnormal bleeding. [phone]

## 2023-07-13 NOTE — Progress Notes (Signed)
ALDON, DEVENY [name] ([id number]) [id number] Page 1 of 10 Visit Report for 07/12/2023 Arrival Information Details Patient Name: Date of Service: Ryan [name], Ryan Ryan [name]. 07/12/2023 8:00 [name] Medical Record Number: [id number] Patient Account Number: [id number] Date of Birth/Sex: Treating RN: 02-May-1971 (52 y.o. [name]) Ryan [name] Primary Care Ryan [name]: Ryan [name] Other Clinician: Referring Ryan [name]: Treating Ryan [name]/Extender: Ryan [name] in Treatment: 12 Visit Information History Since Last Visit Added or deleted any medications: No Patient Arrived: Ambulatory Any new allergies or adverse reactions: No Arrival Time: 08:05 Had [name] fall or experienced change in No Accompanied By: self activities of daily living that may affect Transfer Assistance: None risk of falls: Patient Identification Verified: Yes Signs or symptoms of abuse/neglect since last visito No Secondary Verification Process Completed: Yes Hospitalized since last visit: No Patient Requires Transmission-Based Precautions: No Implantable device outside of the clinic excluding No Patient Has Alerts: Yes cellular tissue based products placed in the center Patient Alerts: Patient on Blood Thinner since last visit: diabetic Has Dressing in Place as Prescribed: Yes Has Compression in Place as Prescribed: Yes Pain Present Now: No Electronic Signature(s) Signed: 07/13/2023 4:42:48 PM By: Ryan Pax RN Entered By: Ryan [name] on 07/12/2023 08:08:29 -------------------------------------------------------------------------------- Clinic Level of Care Assessment Details Patient Name: Date of Service: Ryan [name], Ryan Ryan [name]. 07/12/2023 8:00 [name] Medical Record Number: [id number] Patient Account Number: [id number] Date of Birth/Sex: Treating RN: 23-Apr-1971 (52 y.o. [name]) Ryan [name] Primary Care Deaisha Welborn: Ryan [name] Other Clinician: Referring Kadence Mimbs: Treating Ryan [name]: Ryan [name] in Treatment: 12 Clinic Level of Care Assessment Items TOOL 1 Quantity Score []  - 0 Use when EandM and Procedure is performed on INITIAL visit ASSESSMENTS - Nursing Assessment / Reassessment []  - 0 General Physical Exam (combine w/ comprehensive assessment (listed just below) when performed on new pt. evals) []  - 0 Comprehensive Assessment (HX, ROS, Risk Assessments, Wounds Hx, etc.) Ryan [name] ([id number]) [id number] Page 2 of 10 ASSESSMENTS - Wound and Skin Assessment / Reassessment []  - 0 Dermatologic / Skin Assessment (not related to wound area) ASSESSMENTS - Ostomy and/or Continence Assessment and Care []  - 0 Incontinence Assessment and Management []  - 0 Ostomy Care Assessment and Management (repouching, etc.) PROCESS - Coordination of Care []  - 0 Simple Patient / Family Education for ongoing care []  - 0 Complex (extensive) Patient / Family Education for ongoing care []  - 0 Staff obtains Chiropractor, Records, T Results / Process Orders est []  - 0 Staff telephones HHA, Nursing Homes / Clarify orders / etc []  - 0 Routine Transfer to another Facility (non-emergent condition) []  - 0 Routine Hospital Admission (non-emergent condition) []  - 0 New Admissions / Manufacturing engineer / Ordering NPWT Apligraf, etc. , []  - 0 Emergency Hospital Admission (emergent condition) PROCESS - Special Needs []  - 0 Pediatric / Minor Patient Management []  - 0 Isolation Patient Management []  - 0 Hearing / Language / Visual special needs []  - 0 Assessment of Community assistance (transportation, D/C planning, etc.) []  - 0 Additional assistance / Altered mentation []  - 0 Support Surface(s) Assessment (bed, cushion, seat, etc.) INTERVENTIONS - Miscellaneous []  - 0 External ear exam []  - 0 Patient Transfer (multiple staff / Nurse, adult / Similar devices) []  - 0 Simple Staple / Suture removal (25 or less) []  - 0 Complex  Staple / Suture removal (26 or more) []  - 0 Hypo/Hyperglycemic Management (do not check if billed separately) []  - 0 Ankle / Brachial Index (ABI) - do not  check if billed separately Has the patient been seen at the hospital within the last three years: Yes Total Score: 0 Level Of Care: ____ Electronic Signature(s) Signed: 07/13/2023 4:42:48 PM By: Ryan Pax RN Entered By: Ryan [name] on 07/12/2023 09:14:17 -------------------------------------------------------------------------------- Compression Therapy Details Patient Name: Date of Service: Ryan [name]. 07/12/2023 8:00 [name] Medical Record Number: [id number] Patient Account Number: [id number] Date of Birth/Sex: Treating RN: April 23, 1971 (52 y.o. Ryan [name] Primary Care Asja Frommer: Ryan [name] Other Clinician: Referring Ryan [name]: Treating Lillien Petronio/Extender: Ryan [name] ([id number]) [id number] Page 3 of 10 Weeks in Treatment: 12 Compression Therapy Performed for Wound Assessment: Wound #3 Right,Anterior Lower Leg Performed By: Clinician Ryan Pax, RN Compression Type: Double Layer Post Procedure Diagnosis Same as Pre-procedure Electronic Signature(s) Signed: 07/12/2023 9:12:09 AM By: Ryan Pax RN Entered By: Ryan [name] on 07/12/2023 09:12:08 -------------------------------------------------------------------------------- Compression Therapy Details Patient Name: Date of Service: Ryan [name]. 07/12/2023 8:00 [name] Medical Record Number: [id number] Patient Account Number: [id number] Date of Birth/Sex: Treating RN: 1971-03-08 (52 y.o. Ryan [name] Primary Care Maylea Soria: Ryan [name] Other Clinician: Referring Marene Gilliam: Treating Ryan [name]: Ryan [name] in Treatment: 12 Compression Therapy Performed for Wound Assessment: Non-Wound Location Performed By: Clinician Ryan Pax, RN Compression Type: Double  Layer Location: Lower Extremity, Left Post Procedure Diagnosis Same as Pre-procedure Electronic Signature(s) Signed: 07/12/2023 9:12:25 AM By: Ryan Pax RN Entered By: Ryan [name] on 07/12/2023 09:12:25 -------------------------------------------------------------------------------- Encounter Discharge Information Details Patient Name: Date of Service: Ryan [name]. 07/12/2023 8:00 [name] Medical Record Number: [id number] Patient Account Number: [id number] Date of Birth/Sex: Treating RN: 1971-01-27 (52 y.o. Ryan [name] Primary Care Jerad Dunlap: Ryan [name] Other Clinician: Referring Saryn Cherry: Treating Oronde Hallenbeck/Extender: Ryan [name] in Treatment: 12 Encounter Discharge Information Items Discharge Condition: Stable Ambulatory Status: Ambulatory Discharge Destination: Home MARTICE, HELLUMS [name] ([id number]) [id number] Page 4 of 10 Transportation: Private Auto Schedule Follow-up Appointment: Yes Clinical Summary of Care: Electronic Signature(s) Signed: 07/12/2023 9:14:50 AM By: Ryan Pax RN Entered By: Ryan [name] on 07/12/2023 09:14:50 -------------------------------------------------------------------------------- Lower Extremity Assessment Details Patient Name: Date of Service: Ryan [name], Ryan [name]. 07/12/2023 8:00 [name] Medical Record Number: [id number] Patient Account Number: [id number] Date of Birth/Sex: Treating RN: [birth year] (52 y.o. [name]) Ryan [name] Primary Care Aniello Christopoulos: Ryan [name] Other Clinician: Referring Mariadelaluz Guggenheim: Treating Jerrin Recore/Extender: Ryan [name] in Treatment: 12 Edema Assessment Assessed: [name]: No] [Right: No] [Left: Edema] [Right: :] Calf Left: Right: Point of Measurement: From Medial Instep 57 cm 59 cm Ankle Left: Right: Point of Measurement: From Medial Instep 31 cm 31 cm Knee To Floor Left: Right: From Medial Instep 42 cm 42 cm Vascular  Assessment Pulses: Dorsalis Pedis Palpable: [Left:Yes] [Right:Yes] Extremity colors, hair growth, and conditions: Extremity Color: [Left:Hyperpigmented] Hair Growth on Extremity: [Left:No] [Right:No] Temperature of Extremity: [Left:Warm] [Right:Warm] Capillary Refill: [Left:< 3 seconds] [Right:< 3 seconds] Dependent Rubor: [Left:No] [Right:No] Blanched when Elevated: [Left:No Yes] [Right:No Yes] Toe Nail Assessment Left: Right: Thick: Yes Yes Discolored: Yes Yes Deformed: Yes Yes Improper Length and Hygiene: Yes Yes Electronic Signature(s) Signed: 07/13/2023 4:42:48 PM By: Ryan Pax RN Nedra Hai, Ryan [name] ([id number]) [id number] Page 5 of 10 Entered By: Ryan [name] on 07/12/2023 08:17:11 -------------------------------------------------------------------------------- Multi Wound Chart Details Patient Name: Date of Service: Ryan [name], Ryan Ryan [name]. 07/12/2023 8:00 [name] Medical Record Number: [id number] Patient Account Number: [id number] Date of Birth/Sex: Treating RN: Oct 16, 1970 (52 y.o. [name]) Ryan [name] Primary Care Suzanne Kho: Ryan [name] Other Clinician:  Referring Raymund Manrique: Treating Catherine Cubero/Extender: Ryan [name] in Treatment: 12 Vital Signs Height(in): 67 Pulse(bpm): 80 Weight(lbs): 338 Blood Pressure(mmHg): 142/96 Body Mass Index(BMI): 52.9 Temperature(F): 97.9 Respiratory Rate(breaths/min): 18 [3:Photos:] [N/[name]:N/[name]] Right, Anterior Lower Leg N/[name] N/[name] Wound Location: Gradually Appeared N/[name] N/[name] Wounding Event: Diabetic Wound/Ulcer of the Lower N/[name] N/[name] Primary Etiology: Extremity Arrhythmia, Congestive Heart Failure, N/[name] N/[name] Comorbid History: Hypertension, Peripheral Venous Disease, Type II Diabetes 06/21/2023 N/[name] N/[name] Date Acquired: 2 N/[name] N/[name] Weeks of Treatment: Open N/[name] N/[name] Wound Status: No N/[name] N/[name] Wound Recurrence: 1x0.8x0.1 N/[name] N/[name] Measurements L x W x D (cm) 0.628 N/[name] N/[name] [name] (cm) : rea 0.063 N/[name] N/[name] Volume  (cm) : -42.70% N/[name] N/[name] % Reduction in [name] rea: 28.40% N/[name] N/[name] % Reduction in Volume: Grade 1 N/[name] N/[name] Classification: Medium N/[name] N/[name] Exudate [name] mount: Serosanguineous N/[name] N/[name] Exudate Type: red, brown N/[name] N/[name] Exudate Color: Medium (34-66%) N/[name] N/[name] Granulation [name] mount: Red N/[name] N/[name] Granulation Quality: Medium (34-66%) N/[name] N/[name] Necrotic [name] mount: Fat Layer (Subcutaneous Tissue): Yes N/[name] N/[name] Exposed Structures: Fascia: No Tendon: No Muscle: No Joint: No Bone: No None N/[name] N/[name] Epithelialization: Treatment Notes Electronic Signature(s) Begeman, Monica [name] ([id number]) [id number] Page 6 of 10 Signed: 07/13/2023 4:42:48 PM By: Ryan Pax RN Entered By: Ryan [name] on 07/12/2023 08:17:16 -------------------------------------------------------------------------------- Multi-Disciplinary Care Plan Details Patient Name: Date of Service: Ryan [name]. 07/12/2023 8:00 [name] Medical Record Number: [id number] Patient Account Number: [id number] Date of Birth/Sex: Treating RN: 20-Mar-1971 (52 y.o. [name]) Ryan [name] Primary Care Syenna Nazir: Ryan [name] Other Clinician: Referring Naliyah Neth: Treating Barbra Miner/Extender: Ryan [name] in Treatment: 12 Active Inactive Wound/Skin Impairment Nursing Diagnoses: Knowledge deficit related to ulceration/compromised skin integrity Goals: Patient/caregiver will verbalize understanding of skin care regimen Date Initiated: 04/18/2023 Date Inactivated: 05/26/2023 Target Resolution Date: 05/19/2023 Goal Status: Met Ulcer/skin breakdown will have [name] volume reduction of 30% by week 4 Date Initiated: 04/18/2023 Date Inactivated: 05/26/2023 Target Resolution Date: 05/19/2023 Goal Status: Met Ulcer/skin breakdown will have [name] volume reduction of 50% by week 8 Date Initiated: 04/18/2023 Date Inactivated: 06/07/2023 Target Resolution Date: 06/18/2023 Goal Status: Met Ulcer/skin breakdown will have [name] volume reduction  of 80% by week 12 Date Initiated: 04/18/2023 Target Resolution Date: 07/19/2023 Goal Status: Active Ulcer/skin breakdown will heal within 14 weeks Date Initiated: 04/18/2023 Target Resolution Date: 08/18/2023 Goal Status: Active Interventions: Assess patient/caregiver ability to obtain necessary supplies Assess patient/caregiver ability to perform ulcer/skin care regimen upon admission and as needed Assess ulceration(s) every visit Notes: Electronic Signature(s) Signed: 07/13/2023 4:42:48 PM By: Ryan Pax RN Entered By: Ryan [name] on 07/12/2023 08:17:29 Ryan [name], Ryan [name] ([id number]) [id number] Page 7 of 10 -------------------------------------------------------------------------------- Pain Assessment Details Patient Name: Date of Service: Ryan [name], Ryan Ryan [name]. 07/12/2023 8:00 [name] Medical Record Number: [id number] Patient Account Number: [id number] Date of Birth/Sex: Treating RN: 1971/05/01 (52 y.o. [name]) Ryan [name] Primary Care Kale Dols: Ryan [name] Other Clinician: Referring Judeen Geralds: Treating Cort Dragoo/Extender: Ryan [name] in Treatment: 12 Active Problems Location of Pain Severity and Description of Pain Patient Has Paino No Site Locations Pain Management and Medication Current Pain Management: Electronic Signature(s) Signed: 07/13/2023 4:42:48 PM By: Ryan Pax RN Entered By: Ryan [name] on 07/12/2023 08:08:57 -------------------------------------------------------------------------------- Patient/Caregiver Education Details Patient Name: Date of Service: Ryan [name] 11/12/2024andnbsp8:00 [name] Medical Record Number: [id number] Patient Account Number: [id number] Date of Birth/Gender: Treating RN: 1971/04/25 (52 y.o. Ryan [name] Primary Care Physician: Ryan [name] Other Clinician: Referring Physician: Treating Physician/Extender: Larina Bras,  Farrel Demark, Clydie Braun Weeks in Treatment: 12 Ryan [name], Ryan [name]  ([id number]) [id number] Page 8 of 10 Education Assessment Education Provided To: Patient Education Topics Provided Wound/Skin Impairment: Handouts: Caring for Your Ulcer Methods: Explain/Verbal Responses: State content correctly Electronic Signature(s) Signed: 07/13/2023 4:42:48 PM By: Ryan Pax RN Entered By: Ryan [name] on 07/12/2023 08:17:41 -------------------------------------------------------------------------------- Wound Assessment Details Patient Name: Date of Service: Ryan [name]. 07/12/2023 8:00 [name] Medical Record Number: [id number] Patient Account Number: [id number] Date of Birth/Sex: Treating RN: [birth year] (52 y.o. [name]) Ryan [name] Primary Care Jacklyne Baik: Ryan [name] Other Clinician: Referring Dionisia Pacholski: Treating Shital Crayton/Extender: Ryan [name] in Treatment: 12 Wound Status Wound Number: 3 Primary Diabetic Wound/Ulcer of the Lower Extremity Etiology: Wound Location: Right, Anterior Lower Leg Wound Open Wounding Event: Gradually Appeared Status: Date Acquired: 06/21/2023 Comorbid Arrhythmia, Congestive Heart Failure, Hypertension, Peripheral Weeks Of Treatment: 2 History: Venous Disease, Type II Diabetes Clustered Wound: No Photos Wound Measurements Length: (cm) 1 Width: (cm) 0.8 Depth: (cm) 0.1 Area: (cm) 0.628 Volume: (cm) 0.063 % Reduction in Area: -42.7% % Reduction in Volume: 28.4% Epithelialization: None Tunneling: No Undermining: No Wound Description Classification: Grade 1 Exudate Amount: Medium Exudate Type: Serosanguineous Ryan [name], Ryan [name] [name] ([id number]) Exudate Color: red, brown Foul Odor After Cleansing: No Slough/Fibrino Yes [id number] Page 9 of 10 Wound Bed Granulation Amount: Medium (34-66%) Exposed Structure Granulation Quality: Red Fascia Exposed: No Necrotic Amount: Medium (34-66%) Fat Layer (Subcutaneous Tissue) Exposed: Yes Necrotic Quality:  Adherent Slough Tendon Exposed: No Muscle Exposed: No Joint Exposed: No Bone Exposed: No Treatment Notes Wound #3 (Lower Leg) Wound Laterality: Right, Anterior Cleanser Soap and Water Discharge Instruction: Gently cleanse wound with antibacterial soap, rinse and pat dry prior to dressing wounds Wound Cleanser Discharge Instruction: Wash your hands with soap and water. Remove old dressing, discard into plastic bag and place into trash. Cleanse the wound with Wound Cleanser prior to applying [name] clean dressing using gauze sponges, not tissues or cotton balls. Do not scrub or use excessive force. Pat dry using gauze sponges, not tissue or cotton balls. Peri-Wound Care Topical Primary Dressing Hydrofera Blue Ready Transfer Foam, 2.5x2.5 (in/in) Discharge Instruction: Apply Hydrofera Blue Ready to wound bed as directed Secondary Dressing ABD Pad 5x9 (in/in) Discharge Instruction: Cover with ABD pad Secured With Compression Wrap Urgo K2, two layer compression system, large Compression Stockings Add-Ons Electronic Signature(s) Signed: 07/13/2023 4:42:48 PM By: Ryan Pax RN Entered By: Ryan [name] on 07/12/2023 08:15:32 -------------------------------------------------------------------------------- Vitals Details Patient Name: Date of Service: Ryan [name]. 07/12/2023 8:00 [name] Medical Record Number: [id number] Patient Account Number: [id number] Date of Birth/Sex: Treating RN: [birth year] (52 y.o. [name]) Ryan [name] Primary Care Ghalia Reicks: Ryan [name] Other Clinician: Referring Faviola Klare: Treating Roe Koffman/Extender: Ryan [name] in Treatment: 12 Vital Signs Time Taken: 08:08 Temperature (F): 97.9 Height (in): 67 Pulse (bpm): 80 Weight (lbs): 338 Respiratory Rate (breaths/min): 18 Body Mass Index (BMI): 52.9 Blood Pressure (mmHg): 142/96 Ryan [name], Ryan [name] ([id number]) [id number] Page 10 of 10 Reference Range: 80 - 120 mg /  dl Electronic Signature(s) Signed: 07/13/2023 4:42:48 PM By: Ryan Pax RN Entered By: Ryan [name] on 07/12/2023 22:02:54

## 2023-07-14 NOTE — Telephone Encounter (Signed)
Requested Prescriptions  Pending Prescriptions Disp Refills   furosemide (LASIX) 40 MG tablet [Pharmacy Med Name: FUROSEMIDE 40MG  TABLETS] 90 tablet 0    Sig: TAKE 1 TABLET(40 MG) BY MOUTH EVERY OTHER DAY     Cardiovascular:  Diuretics - Loop Failed - 07/13/2023  9:58 PM      Failed - Cr in normal range and within 180 days    Creatinine  Date Value Ref Range Status  12/28/2014 5.55 (H) mg/dL Final    Comment:    7.25-3.66 NOTE: New Reference Range  11/05/14    Creatinine, Ser  Date Value Ref Range Status  07/04/2023 1.36 (H) 0.76 - 1.27 mg/dL Final         Failed - Cl in normal range and within 180 days    Chloride  Date Value Ref Range Status  07/04/2023 108 (H) 96 - 106 mmol/L Final  12/28/2014 94 (L) mmol/L Final    Comment:    101-111 NOTE: New Reference Range  11/05/14          Passed - K in normal range and within 180 days    Potassium  Date Value Ref Range Status  07/04/2023 4.8 3.5 - 5.2 mmol/L Final  12/28/2014 3.8 mmol/L Final    Comment:    3.5-5.1 NOTE: New Reference Range  11/05/14          Passed - Ca in normal range and within 180 days    Calcium  Date Value Ref Range Status  07/04/2023 9.0 8.7 - 10.2 mg/dL Final   Calcium, Total  Date Value Ref Range Status  12/28/2014 8.1 (L) mg/dL Final    Comment:    4.4-03.4 NOTE: New Reference Range  11/05/14          Passed - Na in normal range and within 180 days    Sodium  Date Value Ref Range Status  07/04/2023 143 134 - 144 mmol/L Final  12/28/2014 130 (L) mmol/L Final    Comment:    135-145 NOTE: New Reference Range  11/05/14          Passed - Mg Level in normal range and within 180 days    Magnesium  Date Value Ref Range Status  06/19/2023 1.9 1.7 - 2.4 mg/dL Final    Comment:    Performed at [facility], [address] [place], [place] [address]  12/28/2014 2.7 (H) mg/dL Final    Comment:    5.6-3.8 THERAPEUTIC RANGE: 4-7 mg/dL TOXIC: > 10 mg/dL   ----------------------- NOTE: New Reference Range  11/05/14  - MG-HEMOLYSIS AT THIS LEVEL MAY AFFECT THE RESULT          Passed - Last BP in normal range    BP Readings from Last 1 Encounters:  07/04/23 131/85         Passed - Valid encounter within last 6 months    Recent Outpatient Visits           1 week ago Hospital discharge follow-up   [facility] [name], NP   1 month ago Hospital discharge follow-up   [place] [facility] Mecum, Oswaldo Conroy, PA-C   2 months ago Open wound of left ankle, subsequent encounter   [place] [facility] Mecum, Erin E, PA-C   3 months ago Pulmonary embolism, other, unspecified chronicity, unspecified whether acute cor pulmonale present [facility])   [place] [facility] Mecum, Oswaldo Conroy, PA-C   4  months ago Other acute pulmonary embolism without acute cor pulmonale [facility])   [facility] [name], NP       Future Appointments             In 5 days [name], NP [facility]   In 6 days Vaughn, Dorie Rank, NP [place] [facility]   In 1 month Darrick Huntsman, Mar Daring, MD [facility] [place]

## 2023-07-17 DIAGNOSIS — Z7901 Long term (current) use of anticoagulants: Secondary | ICD-10-CM | POA: Insufficient documentation

## 2023-07-17 NOTE — Patient Instructions (Signed)
 Warfarin Blood Tests You will learn about the importance of your warfarin dosing, testing, and precautions. Also, signs that your warfarin dose is too high. To view the content, go to this web address: [email or web address]  This video will expire on: 02/20/2025. If you need access to this video following this date, please reach out to the healthcare provider who assigned it to you. This information is not intended to replace advice given to you by your health care provider. Make sure you discuss any questions you have with your health care provider. Elsevier Patient Education  2024 [employer].

## 2023-07-18 ENCOUNTER — Ambulatory Visit: Payer: No Typology Code available for payment source

## 2023-07-18 NOTE — Progress Notes (Deleted)
   There were no vitals taken for this visit.   Subjective:    Patient ID: Ryan [name], male    DOB: [birth year], 52 y.o.   MRN: [id number]  HPI: Ryan [name] is a 52 y.o. male  No chief complaint on file.  Coumadin Management.  The expected duration of coumadin treatment is {Blank single:19197::"lifelong","3 months","6 months"} The reason for anticoagulation is  {Blank single:19197::"A. Fib","thombophlebitis","mechanical heart valve","non-mechanical heart valve","Factor V Leiden","DVT/PE"}.  Present Coumadin dose: Goal: {Blank single:19197::"2.0-3.0","2.5-3.5"} 2.5-3.0 Excessive bruising: {Blank single:19197::"yes","no"} Nose bleeding: {Blank single:19197::"yes","no"} Rectal bleeding: {Blank single:19197::"yes","no"} Prolonged menstrual cycles: {Blank single:19197::"yes","no","N/A"} Eating diet with consistent amounts of foods containing Vitamin K:{Blank single:19197::"yes","no"} Any recent antibiotic use? {Blank single:19197::"yes","no"}  Relevant past medical, surgical, family and social history reviewed and updated as indicated. Interim medical history since our last visit reviewed. Allergies and medications reviewed and updated.  Review of Systems  Per HPI unless specifically indicated above     Objective:    There were no vitals taken for this visit.  Wt Readings from Last 3 Encounters:  07/04/23 (!) 342 lb 3.2 oz (155.2 kg)  06/23/23 (!) 333 lb 8.9 oz (151.3 kg)  06/02/23 (!) 342 lb (155.1 kg)    Physical Exam  Results for orders placed or performed in visit on 07/13/23  POCT INR  Result Value Ref Range   INR 2.4 2.0 - 3.0   POC INR        Assessment & Plan:   Problem List Items Addressed This Visit   None    Follow up plan: No follow-ups on file.

## 2023-07-19 ENCOUNTER — Encounter: Payer: Self-pay | Admitting: Emergency Medicine

## 2023-07-19 ENCOUNTER — Ambulatory Visit: Payer: No Typology Code available for payment source | Admitting: Physician Assistant

## 2023-07-19 ENCOUNTER — Emergency Department: Payer: No Typology Code available for payment source

## 2023-07-19 ENCOUNTER — Emergency Department
Admission: EM | Admit: 2023-07-19 | Discharge: 2023-07-19 | Disposition: A | Discharge status: Home / Self Care | Payer: No Typology Code available for payment source | Attending: Emergency Medicine | Admitting: Emergency Medicine

## 2023-07-19 ENCOUNTER — Other Ambulatory Visit: Payer: Self-pay

## 2023-07-19 ENCOUNTER — Encounter: Payer: No Typology Code available for payment source | Admitting: Physician Assistant

## 2023-07-19 ENCOUNTER — Ambulatory Visit: Payer: No Typology Code available for payment source | Admitting: Nurse Practitioner

## 2023-07-19 DIAGNOSIS — I5033 Acute on chronic diastolic (congestive) heart failure: Secondary | ICD-10-CM | POA: Insufficient documentation

## 2023-07-19 DIAGNOSIS — R0602 Shortness of breath: Secondary | ICD-10-CM | POA: Diagnosis present

## 2023-07-19 DIAGNOSIS — Z7901 Long term (current) use of anticoagulants: Secondary | ICD-10-CM | POA: Insufficient documentation

## 2023-07-19 DIAGNOSIS — E11622 Type 2 diabetes mellitus with other skin ulcer: Secondary | ICD-10-CM | POA: Diagnosis not present

## 2023-07-19 LAB — COMPREHENSIVE METABOLIC PANEL
ALT: 18 U/L (ref 0–44)
AST: 21 U/L (ref 15–41)
Albumin: 3.4 g/dL — ABNORMAL LOW (ref 3.5–5.0)
Alkaline Phosphatase: 51 U/L (ref 38–126)
Anion gap: 10 (ref 5–15)
BUN: 16 mg/dL (ref 6–20)
CO2: 23 mmol/L (ref 22–32)
Calcium: 8.8 mg/dL — ABNORMAL LOW (ref 8.9–10.3)
Chloride: 103 mmol/L (ref 98–111)
Creatinine, Ser: 1.32 mg/dL — ABNORMAL HIGH (ref 0.61–1.24)
GFR, Estimated: >60 mL/min (ref >60)
Glucose, Bld: 98 mg/dL (ref 70–99)
Potassium: 3.8 mmol/L (ref 3.5–5.1)
Sodium: 136 mmol/L (ref 135–145)
Total Bilirubin: 1.7 mg/dL — ABNORMAL HIGH (ref <1.2)
Total Protein: 7.2 g/dL (ref 6.5–8.1)

## 2023-07-19 LAB — CBC WITH DIFFERENTIAL/PLATELET
Abs Immature Granulocytes: 0 10*3/uL (ref 0.00–0.07)
Basophils Absolute: 0 10*3/uL (ref 0.0–0.1)
Basophils Relative: 1 %
Eosinophils Absolute: 0.2 10*3/uL (ref 0.0–0.5)
Eosinophils Relative: 3 %
HCT: 43.8 % (ref 39.0–52.0)
Hemoglobin: 14.2 g/dL (ref 13.0–17.0)
Immature Granulocytes: 0 %
Lymphocytes Relative: 47 %
Lymphs Abs: 2.3 10*3/uL (ref 0.7–4.0)
MCH: 31.3 pg (ref 26.0–34.0)
MCHC: 32.4 g/dL (ref 30.0–36.0)
MCV: 96.5 fL (ref 80.0–100.0)
Monocytes Absolute: 0.7 10*3/uL (ref 0.1–1.0)
Monocytes Relative: 14 %
Neutro Abs: 1.7 10*3/uL (ref 1.7–7.7)
Neutrophils Relative %: 35 %
Platelets: 240 10*3/uL (ref 150–400)
RBC: 4.54 MIL/uL (ref 4.22–5.81)
RDW: 15.8 % — ABNORMAL HIGH (ref 11.5–15.5)
WBC: 4.8 10*3/uL (ref 4.0–10.5)
nRBC: 0 % (ref 0.0–0.2)

## 2023-07-19 LAB — BRAIN NATRIURETIC PEPTIDE: B Natriuretic Peptide: 298 pg/mL — ABNORMAL HIGH (ref 0.0–100.0)

## 2023-07-19 LAB — TROPONIN I (HIGH SENSITIVITY)
Troponin I (High Sensitivity): 36 ng/L — ABNORMAL HIGH (ref <18)
Troponin I (High Sensitivity): 34 ng/L — ABNORMAL HIGH (ref <18)

## 2023-07-19 LAB — PROTIME-INR
INR: 2.1 — ABNORMAL HIGH (ref 0.8–1.2)
Prothrombin Time: 23.7 s — ABNORMAL HIGH (ref 11.4–15.2)

## 2023-07-19 MED ORDER — FUROSEMIDE 10 MG/ML IJ SOLN
60.0000 mg | Freq: Once | INTRAMUSCULAR | Status: AC
  Administered 2023-07-19: 60 mg via INTRAVENOUS
  Filled 2023-07-19: qty 8

## 2023-07-19 MED ORDER — WARFARIN SODIUM 5 MG PO TABS
5.0000 mg | ORAL_TABLET | Freq: Once | ORAL | Status: AC
  Administered 2023-07-19: 5 mg via ORAL
  Filled 2023-07-19: qty 1

## 2023-07-19 MED ORDER — BUTALBITAL-APAP-CAFFEINE 50-325-40 MG PO TABS
2.0000 | ORAL_TABLET | Freq: Once | ORAL | Status: AC
  Administered 2023-07-19: 2 via ORAL
  Filled 2023-07-19: qty 2

## 2023-07-19 MED ORDER — WARFARIN - PHYSICIAN DOSING INPATIENT
Freq: Every day | Status: DC
  Filled 2023-07-19: qty 1

## 2023-07-19 NOTE — ED Triage Notes (Signed)
Increased shortness of breath while at sleep center,  patient did not actually fall asleep

## 2023-07-19 NOTE — Discharge Instructions (Signed)
As we discussed, your INR was a little bit low at 2.1.  I suspect too much fluid/heart failure tonight --for the next 3 days, take your Lasix every day and then go back to every other day  If your symptoms worsen despite this, please return to the ED

## 2023-07-19 NOTE — ED Provider Notes (Signed)
[facility] Provider Note    Event Date/Time   First MD Initiated Contact with Patient 07/19/23 0257     (approximate)   History   Shortness of Breath   HPI  Ryan [name] is a 53 y.o. male who presents to the ED for evaluation of Shortness of Breath   Review of medical DC summary from 10/24.  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  Nighttime hypoxia requiring 2 L while sleeping.  Goal INR 2.5-3.  TEE/cardioversion converted to sinus rhythm during this admission. . Patient presents via EMS from a local sleep center for evaluation of dyspnea.  Reports feeling short of breath while he was doing his sleep study but reports this is resolved and he feels fine on arrival to the ED.  He was feeling normal prior to the sleep study.  Reports that he was semirecumbent during the study and not fully supine, similar to the position that he is in now.  No recent illnesses, congestion, increased swelling, chest discomfort or exertional dyspnea.   Physical Exam   Triage Vital Signs: ED Triage Vitals  Encounter Vitals Group     BP 07/19/23 0257 (!) 144/78     Systolic BP Percentile --      Diastolic BP Percentile --      Pulse Rate 07/19/23 0257 68     Resp 07/19/23 0257 (!) 23     Temp 07/19/23 0257 (!) 97.5 F (36.4 C)     Temp Source 07/19/23 0257 Oral     SpO2 07/19/23 0257 95 %     Weight 07/19/23 0259 (!) 344 lb (156 kg)     Height 07/19/23 0259 5\' 7"  (1.702 m)     Head Circumference --      Peak Flow --      Pain Score 07/19/23 0259 6     Pain Loc --      Pain Education --      Exclude from Growth Chart --     Most recent vital signs: Vitals:   07/19/23 0500 07/19/23 0530  BP: (!) 147/100 (!) 156/109  Pulse: 72 (!) 58  Resp: (!) 25 17  Temp:    SpO2: (!) 86% 92%    General: Awake, no distress.  Morbidly obese, well-appearing and pleasant CV:  Good peripheral perfusion.  Resp:  Normal effort.  Abd:  No  distention.  MSK:  No deformity noted.  Neuro:  No focal deficits appreciated. Other:     ED Results / Procedures / Treatments   Labs (all labs ordered are listed, but only abnormal results are displayed) Labs Reviewed  CBC WITH DIFFERENTIAL/PLATELET - Abnormal; Notable for the following components:      Result Value   RDW 15.8 (*)    All other components within normal limits  BRAIN NATRIURETIC PEPTIDE - Abnormal; Notable for the following components:   B Natriuretic Peptide 298.0 (*)    All other components within normal limits  PROTIME-INR - Abnormal; Notable for the following components:   Prothrombin Time 23.7 (*)    INR 2.1 (*)    All other components within normal limits  COMPREHENSIVE METABOLIC PANEL - Abnormal; Notable for the following components:   Creatinine, Ser 1.32 (*)    Calcium 8.8 (*)    Albumin 3.4 (*)    Total Bilirubin 1.7 (*)    All other components within normal limits  TROPONIN I (HIGH SENSITIVITY) - Abnormal; Notable  for the following components:   Troponin I (High Sensitivity) 36 (*)    All other components within normal limits  TROPONIN I (HIGH SENSITIVITY) - Abnormal; Notable for the following components:   Troponin I (High Sensitivity) 34 (*)    All other components within normal limits    EKG Sinus rhythm with a rate of 70 bpm.  Normal axis.  QTc 505.  Nonspecific ST changes without STEMI.  Similar morphology as comparison from last month  RADIOLOGY CXR interpreted by me without evidence of acute cardiopulmonary pathology.  Official radiology report(s): DG Chest 1 View  Result Date: 07/19/2023 CLINICAL DATA:  Shortness of breath EXAM: PORTABLE CHEST 1 VIEW COMPARISON:  06/14/2023 FINDINGS: The heart size and mediastinal contours are within normal limits. Both lungs are clear. The visualized skeletal structures are unremarkable. IMPRESSION: No active disease. Electronically Signed   By: Alcide Clever M.D.   On: 07/19/2023 03:25    PROCEDURES  and INTERVENTIONS:  .1-3 Lead EKG Interpretation  Performed by: Delton Prairie, MD Authorized by: Delton Prairie, MD     Interpretation: normal     ECG rate:  70   ECG rate assessment: normal     Rhythm: sinus rhythm     Ectopy: none     Conduction: normal     Medications  Warfarin - Physician Dosing Inpatient (has no administration in time range)  warfarin (COUMADIN) tablet 5 mg (5 mg Oral Given 07/19/23 0507)  furosemide (LASIX) injection 60 mg (60 mg Intravenous Given 07/19/23 0451)  butalbital-acetaminophen-caffeine (FIORICET) 50-325-40 MG per tablet 2 tablet (2 tablets Oral Given 07/19/23 0451)     IMPRESSION / MDM / ASSESSMENT AND PLAN / ED COURSE  I reviewed the triage vital signs and the nursing notes.  Differential diagnosis includes, but is not limited to, CHF or volume overload, pneumonia, anxiety, PE, ACS  {Patient presents with symptoms of an acute illness or injury that is potentially life-threatening.  Patient presents to the ED from his sleep study for evaluation of shortness of breath that I suspect is a mild CHF exacerbation suitable for outpatient management.  No distress or indications for BiPAP.  Intermittent hypoxia with sleeping, but none while awake.  Troponins are flat and chronically mildly elevated.  Normal CBC.  BNP is slightly up but his x-ray is clear.  INR is subtherapeutic and we provide him extra dosing of his warfarin.  I discussed with the patient CTA chest considering a subtherapeutic INR but after discussing risks and benefits he does not want to go ahead with this.  We discussed extra dosing of Coumadin and he has another INR check later this morning.  I also discussed observation admission for the patient considering his presenting dyspnea and evidence of CHF exacerbation.  But similarly, after discussing risks and benefits he would like to try to go home first.  He has capacity to make these decisions and seems to understand the risks.  I think it  is appropriate to try outpatient management.  We discussed after the IV Lasix this morning to increase his oral Lasix dosing for the next 3 days before following up with his PCP.  We discussed return precautions.  Clinical Course as of 07/19/23 1610  Tue Jul 19, 2023  0435 Reassessed, still feeling okay.  Discussed workup plan of care.  He is really wanting to go home this morning.  We discussed subtherapeutic INR, elevated BNP and pending second troponin.  Will provide extra dosing of his diuretic, Coumadin [  DS]  [phone] Reassessed and discussed plan of care.  Patient had to be placed on 2 L nasal cannula due to a brief episode of hypoxemia in the setting of sonorous respirations. [DS]  [id number] Reassessed.  He is sitting upright on the edge of the bed and looks okay.  He is wanting to go home.  I am somewhat on the fence, and we discussed this at length and agreed to trial outpatient management with some increased Lasix dosing for couple days [DS]    Clinical Course User Index [DS] Delton Prairie, MD     FINAL CLINICAL IMPRESSION(S) / ED DIAGNOSES   Final diagnoses:  SOB (shortness of breath)  Acute on chronic diastolic congestive heart failure (HCC)     Rx / DC Orders   ED Discharge Orders     None        Note:  This document was prepared using Dragon voice recognition software and may include unintentional dictation errors.   Delton Prairie, MD 07/19/23 [phone]

## 2023-07-19 NOTE — Progress Notes (Signed)
ARCANGELO, ALLUMS [name] ([id number]) [id number] Page 1 of 8 Visit Report for 07/19/2023 Chief Complaint Document Details Patient Name: Date of Service: Ryan [name], Ryan [name]. 07/19/2023 2:30 PM Medical Record Number: [id number] Patient Account Number: [id number] Date of Birth/Sex: Treating RN: 1971-07-23 (52 y.o. [name]) Yevonne Pax Primary Care Provider: [name] Other Clinician: Referring Provider: Treating Provider/Extender: [name] in Treatment: 13 Information Obtained from: Patient Chief Complaint Left leg and left foot ulcers Electronic Signature(s) Signed: 07/19/2023 3:05:09 PM By: [name] PA-C Entered By: [name] on 07/19/2023 12:05:08 -------------------------------------------------------------------------------- HPI Details Patient Name: Date of Service: Ryan Elm RLES [name]. 07/19/2023 2:30 PM Medical Record Number: [id number] Patient Account Number: [id number] Date of Birth/Sex: Treating RN: 15-Sep-1970 (52 y.o. [name] Primary Care Provider: [name] Other Clinician: Referring Provider: Treating Provider/Extender: [name] in Treatment: 13 History of Present Illness Chronic/Inactive Conditions Condition 1: 04-18-2023 patient screening ABI today was 0.83 and this was on the left. With that being said he had previous ABIs done formally on 06-25-2021 with [name] right ABI of 1.12 and [name] TBI of 1.03 on the left she had an ABI of 1.12 with [name] TBI of 0.98. HPI Description: 04-18-2023 upon evaluation today patient presents for initial inspection here in our clinic some concerning issues he is having with his left proximal foot as well as his left lower extremity. This actually appears to be more of [name] situation to be honest. He does have chronic lower extremity swelling secondary to what appears to be venous stasis he has not really had any wounds significantly before and tells me  that it has been [name] probably even 12 or so since he has last used his lymphedema pumps. He is not even sure if they are actually functioning anymore or not. With that being said this is 1 thing that I did want him to look into. He is on [name] fluid pill and he does not wear compression socks on [name] regular basis although I definitely think that something that he also really needs to be doing to be honest. He does not have any injury that occurred to cause these wounds they more or less spontaneously occurred as [name] result of what appears to be excessive swelling. Patient does have [name] history of diabetes mellitus type 2, congestive heart failure, lymphedema, venous hypertension, hypertension, peripheral vascular disease, and chronic kidney disease stage III. 8/26; this is [name] patient with chronic lymphedema. He has wounds on the left medial lower extremity and left medial foot. We have been using Hydrofera Blue and Urgo K2. He tells me he has [name] compression pump at home although it is missing apart and he is not really familiar with with which company to call. He also started on furosemide 20 mg every other day probably would do better with every day and he is talking to his physician about this. AAKARSH, CUTTER [name] ([id number]) [id number] Page 2 of 8 05-05-2023 upon evaluation today patient appears to be doing pretty well currently in regard to his wound. Fortunately there does not appear to be any signs of infection he does have some slough and biofilm noted I did discuss with the patient today I do believe that he would benefit from [name] continuation of therapy with regard to his wound. I think we may need to increase the compression however and also think that he may benefit from targeted debridement today. 05-09-2023 upon evaluation today patient appears to  be doing well currently in regard to his wound on the leg. He and both locations seems to be showing signs of improvement  which is great news and in general I do believe there were making excellent headway towards complete closure which is great news. I do not see any signs of active infection at this time which is also excellent news. 05-16-2023 upon evaluation today patient appears to be doing well currently with regard to his wound we did actually have [name] check at this today but that is not the biggest thing I am concerned about at this point. He unfortunately is having some issues here with oxygen saturation dropping to around 84% times and then bouncing back up and down he is breathing rapidly he is also fluctuating with regard to pulse anywhere from 88 that I got manually to it at some points even up at 110 his body does seem to be compensating. He tells me that he sleeps with oxygen but does not wear it during the day typically he has been short of breath for [name] little under [name] week. Shortly after he was here last week. With that being said the patient tells me that he does have [name] history of pulmonary embolisms. He also tells me that he has been having some issues catching his breath with ambulation as well which is also [name] big part of the issue at this point. Fortunately I do not see any signs of obvious infection and is listening to him I could not tell any abnormal breath sounds although he may be [name] little bit restricted although I cannot be for sure. He tells me that the only thing he may have had [name] little bit of chills but nothing else significant. Nonetheless I think he needs to be evaluated at the ER ASAP. 05-26-2023 upon evaluation today patient appears to be doing well currently in regard to his wounds which are actually significantly better. He did have [name] hospital course where he just got out yesterday he ended up having pulmonary emboli at multiple in fact I think I read 14 that were present and had to be removed. He is now on additional blood thinners and states that he is in [name] good place as far as the blood  thinners are concerned. He is feeling much better as well able to breathe quite nicely. I am very pleased with where things stand currently and do not see any signs of infection at this time which is also good news. 05-31-2023 upon evaluation today patient appears to be doing well with regard to his wound one of them is healed the other is getting close. Unfortunately he is having issues again with his breathing today oxygen saturation around 87 to 90% in general during the evaluation at this point. This has me concerned again as he had previous pulmonary emboli does have an appointment with pulmonology tomorrow but I do not think he needs to wait till tomorrow. 06-07-2023 upon evaluation today patient appears to be doing well currently in regard to his wound. He is tolerating the dressing changes this looks to be doing better in general I think removing in the right direction. I do not see any signs of active infection at this time. No fevers, chills, nausea, vomiting, or diarrhea. 06-28-2023 upon evaluation today patient appears to be doing [name] little worse in regard to his wounds due to the fact that unfortunately this has broken down after he was in the hospital most recently. Again this  is the biggest concern that I have at this point. I do believe that he is going to have to go back into compression to get these under control and that he is gena need ongoing compression stockings. 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 His oxygen saturation remains low even today in the clinic with just sitting and not even talking he was bouncing between 88 and 90 I am not so sure he should not have oxygen 24/7 at least at low levels. 07-05-2023 upon evaluation today patient appears to be doing well currently in regard to his wound. In fact the left leg is doing significantly better in fact appears to be healed. The right leg is improving but at the same time is not completely healed as of yet. I fortunately do not  see any signs of active infection locally or systemically at this time. No fevers, chills, nausea, vomiting, or diarrhea. 07-12-2023 upon evaluation today patient unfortunately comes in today his wrap side slid down quite significantly on his legs. Fortunately I do not see any signs of infection but at the same time the wound on the right leg is still open at this point we are working on ordering the juxta fit compression wraps. 07-19-2023 upon evaluation today patient appears to be doing well currently in regard to his wounds. He has been tolerating the dressing changes without complication. Wound does seem to be doing much better. It is going require some sharp debridement. Electronic Signature(s) Signed: 07/19/2023 3:31:49 PM By: [name] PA-C Entered By: [name] on 07/19/2023 12:31:49 -------------------------------------------------------------------------------- Physical Exam Details Patient Name: Date of Service: Ryan [name], Ryan RLES [name]. 07/19/2023 2:30 PM Medical Record Number: [id number] Patient Account Number: [id number] Date of Birth/Sex: Treating RN: Jan 15, 1971 (52 y.o. [name] Primary Care Provider: [name] Other Clinician: Referring Provider: Treating Provider/Extender: [name] in Treatment: 13 Constitutional Well-nourished and well-hydrated in no acute distress. Respiratory normal breathing without difficulty. Psychiatric this patient is able to make decisions and demonstrates good insight into disease process. Alert and Oriented x 3. pleasant and cooperative. Ryan [name], Ryan [name] ([id number]) [id number] Page 3 of 8 Notes Upon inspection patient's wound bed actually showed signs of good granulation epithelization at this point. Fortunately I do not see any evidence of worsening overall and I believe that the patient is making excellent headway towards closure which is great news. No fevers, chills, nausea,  vomiting, or diarrhea. Electronic Signature(s) Signed: 07/19/2023 3:32:04 PM By: [name] PA-C Entered By: [name] on 07/19/2023 12:32:04 -------------------------------------------------------------------------------- Physician Orders Details Patient Name: Date of Service: Ryan Elm RLES [name]. 07/19/2023 2:30 PM Medical Record Number: [id number] Patient Account Number: [id number] Date of Birth/Sex: Treating RN: [birth year] (52 y.o. [name]) Yevonne Pax Primary Care Provider: [name] Other Clinician: Referring Provider: Treating Provider/Extender: [name] in Treatment: 13 Verbal / Phone Orders: No Diagnosis Coding ICD-10 Coding Code Description E11.622 Type 2 diabetes mellitus with other skin ulcer L97.822 Non-pressure chronic ulcer of other part of left lower leg with fat layer exposed E11.621 Type 2 diabetes mellitus with foot ulcer L97.522 Non-pressure chronic ulcer of other part of left foot with fat layer exposed I50.42 Chronic combined systolic (congestive) and diastolic (congestive) heart failure I89.0 Lymphedema, not elsewhere classified 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 Bathing/ [employer]  May shower with wound dressing protected with water repellent cover or cast protector. Anesthetic (Use 'Patient Medications' Section for Anesthetic Order Entry) Lidocaine applied to wound bed Edema Control - Orders / Instructions UrgoK2 - left lower leg Elevate, Exercise Daily and [name] void Standing for Long Periods of Time. Elevate legs to the level of the heart and pump ankles as often as possible Elevate leg(s) parallel to the floor when sitting. Other: - Order Juxta Fit Wound  Treatment Wound #3 - Lower Leg Wound Laterality: Right, Anterior Cleanser: Soap and Water 1 x Per Week/30 Days Discharge Instructions: Gently cleanse wound with antibacterial soap, rinse and pat dry prior to dressing wounds Cleanser: Wound Cleanser 1 x Per Week/30 Days Discharge Instructions: Wash your hands with soap and water. Remove old dressing, discard into plastic bag and place into trash. Cleanse the wound with Wound Cleanser prior to applying [name] clean dressing using gauze sponges, not tissues or cotton balls. Do not scrub or use excessive force. Pat dry using gauze sponges, not tissue or cotton balls. Ryan [name], Ryan [name] ([id number]) [id number] Page 4 of 8 Prim Dressing: Hydrofera Blue Ready Transfer Foam, 2.5x2.5 (in/in) 1 x Per Week/30 Days ary Discharge Instructions: Apply Hydrofera Blue Ready to wound bed as directed Secondary Dressing: ABD Pad 5x9 (in/in) 1 x Per Week/30 Days Discharge Instructions: Cover with ABD pad Compression Wrap: Urgo K2, two layer compression system, large 1 x Per Week/30 Days Electronic Signature(s) Signed: 07/19/2023 4:00:09 PM By: [name] PA-C Signed: 07/20/2023 4:41:11 PM By: Yevonne Pax RN Entered By: Yevonne Pax on 07/19/2023 12:06:53 -------------------------------------------------------------------------------- Problem List Details Patient Name: Date of Service: Ryan Elm RLES [name]. 07/19/2023 2:30 PM Medical Record Number: [id number] Patient Account Number: [id number] Date of Birth/Sex: Treating RN: [birth year] (52 y.o. [name] Primary Care Provider: [name] Other Clinician: Referring Provider: Treating Provider/Extender: [name] in Treatment: 13 Active Problems ICD-10 Encounter Code Description Active Date MDM Diagnosis E11.622 Type 2 diabetes mellitus with other skin ulcer 04/18/2023 No Yes L97.822 Non-pressure chronic ulcer of other part of left lower leg with fat  layer exposed8/19/2024 No Yes E11.621 Type 2 diabetes mellitus with foot ulcer 04/18/2023 No Yes L97.522 Non-pressure chronic ulcer of other part of left foot with fat layer exposed 04/18/2023 No Yes I50.42 Chronic combined systolic (congestive) and diastolic (congestive) heart failure 04/18/2023 No Yes I89.0 Lymphedema, not elsewhere classified 04/18/2023 No Yes I87.332 Chronic venous hypertension (idiopathic) with ulcer and inflammation of left 04/18/2023 No Yes lower extremity I10 Essential (primary) hypertension 04/18/2023 No Yes Henk, Langley [name] ([id number]) [phone].pdf Page 5 of 8 I73.89 Other specified peripheral vascular diseases 04/18/2023 No Yes N18.30 Chronic kidney disease, stage 3 unspecified 04/18/2023 No Yes Inactive Problems Resolved Problems Electronic Signature(s) Signed: 07/19/2023 3:05:05 PM By: [name] PA-C Entered By: [name] on 07/19/2023 12:05:05 -------------------------------------------------------------------------------- Progress Note Details Patient Name: Date of Service: Ryan Elm RLES [name]. 07/19/2023 2:30 PM Medical Record Number: [id number] Patient Account Number: [id number] Date of Birth/Sex: Treating RN: 04/29/1971 (52 y.o. [name] Primary Care Provider: [name] Other Clinician: Referring Provider: Treating Provider/Extender: [name] in Treatment: 13 Subjective Chief Complaint Information obtained from Patient Left leg and left foot ulcers History of Present Illness (HPI) Chronic/Inactive Condition: 04-18-2023 patient screening ABI today was 0.83 and this was on the left. With that being said he had previous ABIs done formally on 06-25-2021 with [name] right ABI of 1.12 and [name] TBI of 1.03 on the left  she had an ABI of 1.12 with [name] TBI of 0.98. 04-18-2023 upon evaluation today patient presents for initial inspection here in our clinic some concerning issues he is having with his left  proximal foot as well as his left lower extremity. This actually appears to be more of [name] situation to be honest. He does have chronic lower extremity swelling secondary to what appears to be venous stasis he has not really had any wounds significantly before and tells me that it has been [name] probably even 12 or so since he has last used his lymphedema pumps. He is not even sure if they are actually functioning anymore or not. With that being said this is 1 thing that I did want him to look into. He is on [name] fluid pill and he does not wear compression socks on [name] regular basis although I definitely think that something that he also really needs to be doing to be honest. He does not have any injury that occurred to cause these wounds they more or less spontaneously occurred as [name] result of what appears to be excessive swelling. Patient does have [name] history of diabetes mellitus type 2, congestive heart failure, lymphedema, venous hypertension, hypertension, peripheral vascular disease, and chronic kidney disease stage III. 8/26; this is [name] patient with chronic lymphedema. He has wounds on the left medial lower extremity and left medial foot. We have been using Hydrofera Blue and Urgo K2. He tells me he has [name] compression pump at home although it is missing apart and he is not really familiar with with which company to call. He also started on furosemide 20 mg every other day probably would do better with every day and he is talking to his physician about this. 05-05-2023 upon evaluation today patient appears to be doing pretty well currently in regard to his wound. Fortunately there does not appear to be any signs of infection he does have some slough and biofilm noted I did discuss with the patient today I do believe that he would benefit from [name] continuation of therapy with regard to his wound. I think we may need to increase the compression however and also think that he may  benefit from targeted debridement today. 05-09-2023 upon evaluation today patient appears to be doing well currently in regard to his wound on the leg. He and both locations seems to be showing signs of improvement which is great news and in general I do believe there were making excellent headway towards complete closure which is great news. I do not see any signs of active infection at this time which is also excellent news. 05-16-2023 upon evaluation today patient appears to be doing well currently with regard to his wound we did actually have [name] check at this today but that is not the biggest thing I am concerned about at this point. He unfortunately is having some issues here with oxygen saturation dropping to around 84% times and then bouncing back up and down he is breathing rapidly he is also fluctuating with regard to pulse anywhere from 88 that I got manually to it at some points even up at 110 his body does seem to be compensating. He tells me that he sleeps with oxygen but does not wear it during the day typically he has been short of breath for [name] little under [name] week. Shortly after he was here last week. With that being said the patient tells me that he does have  [name] history of pulmonary embolisms. He also tells me that he has been having some issues catching his breath with ambulation as well which is also [name] big part of the issue at this point. Ryan [name], Ryan [name] ([id number]) [id number] Page 6 of 8 Fortunately I do not see any signs of obvious infection and is listening to him I could not tell any abnormal breath sounds although he may be [name] little bit restricted although I cannot be for sure. He tells me that the only thing he may have had [name] little bit of chills but nothing else significant. Nonetheless I think he needs to be evaluated at the ER ASAP. 05-26-2023 upon evaluation today patient appears to be doing well currently in regard to his wounds which are actually  significantly better. He did have [name] hospital course where he just got out yesterday he ended up having pulmonary emboli at multiple in fact I think I read 14 that were present and had to be removed. He is now on additional blood thinners and states that he is in [name] good place as far as the blood thinners are concerned. He is feeling much better as well able to breathe quite nicely. I am very pleased with where things stand currently and do not see any signs of infection at this time which is also good news. 05-31-2023 upon evaluation today patient appears to be doing well with regard to his wound one of them is healed the other is getting close. Unfortunately he is having issues again with his breathing today oxygen saturation around 87 to 90% in general during the evaluation at this point. This has me concerned again as he had previous pulmonary emboli does have an appointment with pulmonology tomorrow but I do not think he needs to wait till tomorrow. 06-07-2023 upon evaluation today patient appears to be doing well currently in regard to his wound. He is tolerating the dressing changes this looks to be doing better in general I think removing in the right direction. I do not see any signs of active infection at this time. No fevers, chills, nausea, vomiting, or diarrhea. 06-28-2023 upon evaluation today patient appears to be doing [name] little worse in regard to his wounds due to the fact that unfortunately this has broken down after he was in the hospital most recently. Again this is the biggest concern that I have at this point. I do believe that he is going to have to go back into compression to get these under control and that he is gena need ongoing compression stockings. 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 His oxygen saturation remains low even today in the clinic with just sitting and not even talking he was bouncing between 88 and 90 I am not so sure he should not have oxygen 24/7 at least at  low levels. 07-05-2023 upon evaluation today patient appears to be doing well currently in regard to his wound. In fact the left leg is doing significantly better in fact appears to be healed. The right leg is improving but at the same time is not completely healed as of yet. I fortunately do not see any signs of active infection locally or systemically at this time. No fevers, chills, nausea, vomiting, or diarrhea. 07-12-2023 upon evaluation today patient unfortunately comes in today his wrap side slid down quite significantly on his legs. Fortunately I do not see any signs of infection but at the same time the wound on the right leg is still open  at this point we are working on ordering the juxta fit compression wraps. 07-19-2023 upon evaluation today patient appears to be doing well currently in regard to his wounds. He has been tolerating the dressing changes without complication. Wound does seem to be doing much better. It is going require some sharp debridement. Objective Constitutional Well-nourished and well-hydrated in no acute distress. Vitals Time Taken: 2:55 PM, Height: 67 in, Weight: 338 lbs, BMI: 52.9, Temperature: 97.6 F, Pulse: 108 bpm, Respiratory Rate: 18 breaths/min, Blood Pressure: 183/88 mmHg. Respiratory normal breathing without difficulty. Psychiatric this patient is able to make decisions and demonstrates good insight into disease process. Alert and Oriented x 3. pleasant and cooperative. General Notes: Upon inspection patient's wound bed actually showed signs of good granulation epithelization at this point. Fortunately I do not see any evidence of worsening overall and I believe that the patient is making excellent headway towards closure which is great news. No fevers, chills, nausea, vomiting, or diarrhea. Integumentary (Hair, Skin) Wound #3 status is Open. Original cause of wound was Gradually Appeared. The date acquired was: 06/21/2023. The wound has been in  treatment 3 weeks. The wound is located on the Right,Anterior Lower Leg. The wound measures 0.5cm length x 1cm width x 0.1cm depth; 0.393cm^2 area and 0.039cm^3 volume. There is Fat Layer (Subcutaneous Tissue) exposed. There is no tunneling or undermining noted. There is [name] medium amount of serosanguineous drainage noted. There is medium (34-66%) red granulation within the wound bed. There is [name] medium (34-66%) amount of necrotic tissue within the wound bed including Adherent Slough. 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 There was [name] Double Layer Compression Therapy Procedure by Yevonne Pax, RN. Post procedure Diagnosis Wound #3: Same as Pre-Procedure Plan Follow-up Appointments: Return Appointment in 1 week. Bathing/ Shower/ Hygiene: May shower with wound dressing protected with water repellent cover or cast protector. Anesthetic (Use 'Patient Medications' Section for Anesthetic Order Entry): Lidocaine applied to wound bed Edema Control - Orders / Instructions: UrgoK2 - left lower leg Elevate, Exercise Daily and Avoid Standing for Long Periods of Time. Elevate legs to the level of the heart  and pump ankles as often as possible Elevate leg(s) parallel to the floor when sitting. Other: - Order Juxta Fit WOUND #3: - Lower Leg Wound Laterality: Right, Anterior Cleanser: Soap and Water 1 x Per Week/30 Days Discharge Instructions: Gently cleanse wound with antibacterial soap, rinse and pat dry prior to dressing wounds Cleanser: Wound Cleanser 1 x Per Week/30 Days Discharge Instructions: Wash your hands with soap and water. Remove old dressing, discard into plastic bag and place into trash. Cleanse the wound with Wound Cleanser prior to applying [name] clean dressing using gauze sponges, not tissues or cotton balls. Do not scrub or use excessive force. Pat dry using gauze sponges, not tissue or cotton balls. Prim Dressing: Hydrofera Blue  Ready Transfer Foam, 2.5x2.5 (in/in) 1 x Per Week/30 Days ary Discharge Instructions: Apply Hydrofera Blue Ready to wound bed as directed Secondary Dressing: ABD Pad 5x9 (in/in) 1 x Per Week/30 Days Discharge Instructions: Cover with ABD pad Com pression Wrap: Urgo K2, two layer compression system, large 1 x Per Week/30 Days 1. I would recommend the patient should continue to monitor for any signs of infection or worsening. Based on what I see I do believe that we are making really good headway here towards getting the wound closed although he keeps having [name] lot of other issues when it comes to his breathing in general and the congestive heart failure. 2. I would recommend that he continue with the [facility] followed by the ABD pad. 3. I am also going to continue with Urgo K2 compression wrapping. We will see patient back for reevaluation in 1 week here in the clinic. If anything worsens or changes patient will contact our office for additional recommendations. Electronic Signature(s) Signed: 07/19/2023 3:32:31 PM By: [name] PA-C Entered By: [name] on 07/19/2023  12:32:31 -------------------------------------------------------------------------------- SuperBill Details Patient Name: Date of Service: Ryan Elm RLES [name]. 07/19/2023 Medical Record Number: [id number] Patient Account Number: [id number] Date of Birth/Sex: Treating RN: [birth year] (52 y.o. [name] Primary Care Provider: [name] Other Clinician: Referring Provider: Treating Provider/Extender: [name] in Treatment: [address] Ryan [name], Ryan [name] ([id number]) [id number] Page 8 of 8 ICD-10 Codes Code Description E11.622 Type 2 diabetes mellitus with other skin ulcer L97.822 Non-pressure chronic ulcer of other part of left lower leg with fat layer exposed E11.621 Type 2 diabetes mellitus with foot ulcer L97.522 Non-pressure chronic ulcer of other part of left foot with fat layer exposed I50.42 Chronic combined systolic (congestive) and diastolic (congestive) heart failure I89.0 Lymphedema, not elsewhere classified 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 I10 Essential (primary) hypertension I73.89 Other specified peripheral vascular diseases N18.30 Chronic kidney disease, stage 3 unspecified Facility Procedures : CPT4: Code [id number] Description: 81 BILATERAL: Application of multi-layer venous compression system; leg (below knee), including ankle and t. Modifier: Quantity: 1 Physician Procedures : CPT4 Code Description Modifier [id number] - WC PHYS LEVEL 3 - EST PT ICD-10 Diagnosis Description E11.622 Type 2 diabetes mellitus with other skin ulcer L97.822 Non-pressure chronic ulcer of other part of left lower leg with fat layer exposed  E11.621 Type 2 diabetes mellitus with foot ulcer L97.522 Non-pressure chronic ulcer of other part of left foot with fat layer exposed Quantity: 1 Electronic Signature(s) Signed: 07/19/2023 3:32:50 PM By: [name] PA-C Entered By: [name] on 07/19/2023 12:32:50

## 2023-07-20 ENCOUNTER — Ambulatory Visit: Payer: No Typology Code available for payment source | Admitting: Nurse Practitioner

## 2023-07-20 ENCOUNTER — Encounter: Payer: Self-pay | Admitting: Nurse Practitioner

## 2023-07-20 ENCOUNTER — Telehealth: Payer: Self-pay | Admitting: Nurse Practitioner

## 2023-07-20 VITALS — BP 146/90 | HR 86 | Temp 97.7°F | Wt 339.4 lb

## 2023-07-20 DIAGNOSIS — Z7901 Long term (current) use of anticoagulants: Secondary | ICD-10-CM

## 2023-07-20 DIAGNOSIS — I825Z3 Chronic embolism and thrombosis of unspecified deep veins of distal lower extremity, bilateral: Secondary | ICD-10-CM | POA: Diagnosis not present

## 2023-07-20 DIAGNOSIS — I4891 Unspecified atrial fibrillation: Secondary | ICD-10-CM

## 2023-07-20 NOTE — Progress Notes (Signed)
BP (!) 146/90   Pulse 86   Temp 97.7 F (36.5 C) (Oral)   Wt (!) 339 lb 6.4 oz (154 kg)   SpO2 90%   BMI 53.16 kg/m    Subjective:    Patient ID: Ryan [name], male    DOB: 1970-12-01, 52 y.o.   MRN: [id number]  CC: Coumadin management  HPI: This patient is a 52 y.o. male who presents for coumadin management. The expected duration of coumadin treatment is lifelong The reason for anticoagulation is chronic DVT and  A. Fib.  Was in ER yesterday for SOB -- INR was 2.1, in goal.  Has not taken medications this morning, BP slightly elevated.  At home he gets 120-130/80 range.  Coumadin clinic is taking over monitoring INR.    Present Coumadin dose: 5 MG daily Goal: 2.0-3.0  Excessive bruising: no Nose bleeding: no Rectal bleeding: no Eating diet with consistent amounts of foods containing Vitamin K:yes Any recent antibiotic use? no  Relevant past medical, surgical, family and social history reviewed and updated as indicated. Interim medical history since our last visit reviewed. Allergies and medications reviewed and updated.  ROS: Per HPI unless specifically indicated above     Objective:    BP (!) 146/90   Pulse 86   Temp 97.7 F (36.5 C) (Oral)   Wt (!) 339 lb 6.4 oz (154 kg)   SpO2 90%   BMI 53.16 kg/m   Wt Readings from Last 3 Encounters:  07/20/23 (!) 339 lb 6.4 oz (154 kg)  07/19/23 (!) 344 lb (156 kg)  07/04/23 (!) 342 lb 3.2 oz (155.2 kg)     General: Well appearing, well nourished in no distress.  Normal mood and affect. Skin: No excessive bruising or rash  Last INR: 2.1 yesterday    Last CBC:  Lab Results  Component Value Date   WBC 4.8 07/19/2023   HGB 14.2 07/19/2023   HCT 43.8 07/19/2023   MCV 96.5 07/19/2023   PLT 240 07/19/2023    Results for orders placed or performed during the hospital encounter of 07/19/23  CBC with Differential/Platelet  Result Value Ref Range   WBC 4.8 4.0 - 10.5 K/uL   RBC 4.54 4.22 - 5.81 MIL/uL   Hemoglobin  14.2 13.0 - 17.0 g/dL   HCT 78.2 95.6 - 21.3 %   MCV 96.5 80.0 - 100.0 fL   MCH 31.3 26.0 - 34.0 pg   MCHC 32.4 30.0 - 36.0 g/dL   RDW 08.6 (H) 57.8 - 46.9 %   Platelets 240 150 - 400 K/uL   nRBC 0.0 0.0 - 0.2 %   Neutrophils Relative % 35 %   Neutro Abs 1.7 1.7 - 7.7 K/uL   Lymphocytes Relative 47 %   Lymphs Abs 2.3 0.7 - 4.0 K/uL   Monocytes Relative 14 %   Monocytes Absolute 0.7 0.1 - 1.0 K/uL   Eosinophils Relative 3 %   Eosinophils Absolute 0.2 0.0 - 0.5 K/uL   Basophils Relative 1 %   Basophils Absolute 0.0 0.0 - 0.1 K/uL   Immature Granulocytes 0 %   Abs Immature Granulocytes 0.00 0.00 - 0.07 K/uL  Brain natriuretic peptide  Result Value Ref Range   B Natriuretic Peptide 298.0 (H) 0.0 - 100.0 pg/mL  Protime-INR  Result Value Ref Range   Prothrombin Time 23.7 (H) 11.4 - 15.2 seconds   INR 2.1 (H) 0.8 - 1.2  Comprehensive metabolic panel  Result Value Ref Range  Sodium 136 135 - 145 mmol/L   Potassium 3.8 3.5 - 5.1 mmol/L   Chloride 103 98 - 111 mmol/L   CO2 23 22 - 32 mmol/L   Glucose, Bld 98 70 - 99 mg/dL   BUN 16 6 - 20 mg/dL   Creatinine, Ser 1.61 (H) 0.61 - 1.24 mg/dL   Calcium 8.8 (L) 8.9 - 10.3 mg/dL   Total Protein 7.2 6.5 - 8.1 g/dL   Albumin 3.4 (L) 3.5 - 5.0 g/dL   AST 21 15 - 41 U/L   ALT 18 0 - 44 U/L   Alkaline Phosphatase 51 38 - 126 U/L   Total Bilirubin 1.7 (H) <1.2 mg/dL   GFR, Estimated >09 >60 mL/min   Anion gap 10 5 - 15  Troponin I (High Sensitivity)  Result Value Ref Range   Troponin I (High Sensitivity) 36 (H) <18 ng/L  Troponin I (High Sensitivity)  Result Value Ref Range   Troponin I (High Sensitivity) 34 (H) <18 ng/L       Assessment:     ICD-10-CM   1. Atrial fibrillation with rapid ventricular response (HCC)  I48.91 CANCELED: CoaguChek XS/INR Waived (STAT)    2. 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 Current use of long term  anticoagulation  Z79.01       Plan:   Discussed current plan face-to-face with patient. For coumadin dosing, elected to continue current dose. Will plan to recheck INR in 1 month with Dr. Darrick Huntsman.

## 2023-07-20 NOTE — Telephone Encounter (Signed)
Should this wait and be done with patient's new PCP next month?

## 2023-07-20 NOTE — Assessment & Plan Note (Signed)
Chronic, ongoing.  Continue current regimen as prescribed by specialist and collaboration with them.  He is transferring care to Dr. Darrick Huntsman on 08/18/23, will follow-up with her.

## 2023-07-20 NOTE — Progress Notes (Signed)
Ryan, HERRON [name] ([id number]) [id number] Page 1 of 9 Visit Report for 07/19/2023 Arrival Information Details Patient Name: Date of Service: Ryan [name], Ryan [name]. 07/19/2023 2:30 PM Medical Record Number: [id number] Patient Account Number: [id number] Date of Birth/Sex: Treating RN: 1971-04-17 (52 y.o. Ryan [name]) Yevonne Pax Primary Care Laterrica Libman: [name] Other Clinician: Referring Cheryln Balcom: Treating Shadae Reino/Extender: [name] in Treatment: 13 Visit Information History Since Last Visit Added or deleted any medications: No Patient Arrived: Ambulatory Any new allergies or adverse reactions: No Arrival Time: 14:54 Had [name] fall or experienced change in No Accompanied By: self activities of daily living that may affect Transfer Assistance: None risk of falls: Patient Identification Verified: Yes Signs or symptoms of abuse/neglect since last visito No Secondary Verification Process Completed: Yes Hospitalized since last visit: No Patient Requires Transmission-Based Precautions: No Implantable device outside of the clinic excluding No Patient Has Alerts: Yes cellular tissue based products placed in the center Patient Alerts: Patient on Blood Thinner since last visit: diabetic Has Dressing in Place as Prescribed: Yes Has Compression in Place as Prescribed: Yes Pain Present Now: No Electronic Signature(s) Signed: 07/20/2023 4:41:11 PM By: Yevonne Pax RN Entered By: Yevonne Pax on 07/19/2023 14:55:18 -------------------------------------------------------------------------------- Clinic Level of Care Assessment Details Patient Name: Date of Service: [name], Ryan [name]. 07/19/2023 2:30 PM Medical Record Number: [id number] Patient Account Number: [id number] Date of Birth/Sex: Treating RN: 12/04/1970 (52 y.o. [name] Primary Care Chicquita Mendel: [name] Other Clinician: Referring Caroleen Stoermer: Treating Danyela Posas/Extender: [name] in Treatment: 13 Clinic Level of Care Assessment Items TOOL 1 Quantity Score []  - 0 Use when EandM and Procedure is performed on INITIAL visit ASSESSMENTS - Nursing Assessment / Reassessment []  - 0 General Physical Exam (combine w/ comprehensive assessment (listed just below) when performed on new pt. evals) []  - 0 Comprehensive Assessment (HX, ROS, Risk Assessments, Wounds Hx, etc.) [name], Andriel [name] ([id number]) [id number] Page 2 of 9 ASSESSMENTS - Wound and Skin Assessment / Reassessment []  - 0 Dermatologic / Skin Assessment (not related to wound area) ASSESSMENTS - Ostomy and/or Continence Assessment and Care []  - 0 Incontinence Assessment and Management []  - 0 Ostomy Care Assessment and Management (repouching, etc.) PROCESS - Coordination of Care []  - 0 Simple Patient / Family Education for ongoing care []  - 0 Complex (extensive) Patient / Family Education for ongoing care []  - 0 Staff obtains Chiropractor, Records, T Results / Process Orders est []  - 0 Staff telephones HHA, Nursing Homes / Clarify orders / etc []  - 0 Routine Transfer to another Facility (non-emergent condition) []  - 0 Routine Hospital Admission (non-emergent condition) []  - 0 New Admissions / Manufacturing engineer / Ordering NPWT Apligraf, etc. , []  - 0 Emergency Hospital Admission (emergent condition) PROCESS - Special Needs []  - 0 Pediatric / Minor Patient Management []  - 0 Isolation Patient Management []  - 0 Hearing / Language / Visual special needs []  - 0 Assessment of Community assistance (transportation, D/C planning, etc.) []  - 0 Additional assistance / Altered mentation []  - 0 Support Surface(s) Assessment (bed, cushion, seat, etc.) INTERVENTIONS - Miscellaneous []  - 0 External ear exam []  - 0 Patient Transfer (multiple staff / Nurse, adult / Similar devices) []  - 0 Simple Staple / Suture removal (25 or less) []  - 0 Complex  Staple / Suture removal (26 or more) []  - 0 Hypo/Hyperglycemic Management (do not check if billed separately) []  - 0 Ankle / Brachial Index (ABI) - do not check if  billed separately Has the patient been seen at the hospital within the last three years: Yes Total Score: 0 Level Of Care: ____ Electronic Signature(s) Signed: 07/20/2023 4:41:11 PM By: Yevonne Pax RN Entered By: Yevonne Pax on 07/19/2023 15:07:36 -------------------------------------------------------------------------------- Compression Therapy Details Patient Name: Date of Service: [name], Ryan RLES [name]. 07/19/2023 2:30 PM Medical Record Number: [id number] Patient Account Number: [id number] Date of Birth/Sex: Treating RN: [birth year] (52 y.o. [name] Primary Care Ukiah Trawick: [name] Other Clinician: Referring Cassian Torelli: Treating Aireona Torelli/Extender: Reagen, Longenberger [name] ([id number]) [id number] Page 3 of 9 Weeks in Treatment: 13 Compression Therapy Performed for Wound Assessment: Wound #3 Right,Anterior Lower Leg Performed By: Clinician Yevonne Pax, RN Compression Type: Double Layer Post Procedure Diagnosis Same as Pre-procedure Electronic Signature(s) Signed: 07/20/2023 4:41:11 PM By: Yevonne Pax RN Entered By: Yevonne Pax on 07/19/2023 15:07:24 -------------------------------------------------------------------------------- Encounter Discharge Information Details Patient Name: Date of Service: Ryan [name] RLES [name]. 07/19/2023 2:30 PM Medical Record Number: [id number] Patient Account Number: [id number] Date of Birth/Sex: Treating RN: March 28, 1971 (52 y.o. [name] Primary Care [name]: [name] Other Clinician: Referring Harvest Stanco: Treating Shalawn Wynder/Extender: [name] in Treatment: 13 Encounter Discharge Information Items Discharge Condition: Stable Ambulatory Status: Ambulatory Discharge Destination:  Home Transportation: Private Auto Accompanied By: self Schedule Follow-up Appointment: Yes Clinical Summary of Care: Electronic Signature(s) Signed: 07/20/2023 4:41:11 PM By: Yevonne Pax RN Entered By: Yevonne Pax on 07/19/2023 15:08:35 -------------------------------------------------------------------------------- Lower Extremity Assessment Details Patient Name: Date of Service: ELIASON, MEMBRENO [name]. 07/19/2023 2:30 PM Medical Record Number: [id number] Patient Account Number: [id number] Date of Birth/Sex: Treating RN: May 01, 1971 (52 y.o. [name] Primary Care Tashica Provencio: [name] Other Clinician: Referring Armani Brar: Treating Ismail Graziani/Extender: [name] in Treatment: 13 Edema Assessment L[Left: EE, Faye [name] ([id number])] [name]: [id number] Page 4 of 9] Assessed: [Left: No] [Right: No] [Left: Edema] [Right: :] Calf Left: Right: Point of Measurement: 40 cm From Medial Instep 50 cm 53 cm Ankle Left: Right: Point of Measurement: 10 cm From Medial Instep 31 cm 29 cm Knee To Floor Left: Right: From Medial Instep 44 cm 44 cm Vascular Assessment Pulses: Dorsalis Pedis Palpable: [Left:Yes] [Right:Yes] Extremity colors, hair growth, and conditions: Extremity Color: [Left:Hyperpigmented] [Right:Hyperpigmented] Hair Growth on Extremity: [Left:No] [Right:No] Temperature of Extremity: [Left:Warm] [Right:Warm] Capillary Refill: [Left:< 3 seconds] [Right:< 3 seconds] Dependent Rubor: [Left:No] [Right:No] Blanched when Elevated: [Left:No No] [Right:No No] Toe Nail Assessment Left: Right: Thick: Yes Yes Discolored: Yes Yes Deformed: Yes Yes Improper Length and Hygiene: Yes Yes Electronic Signature(s) Signed: 07/20/2023 4:41:11 PM By: Yevonne Pax RN Entered By: Yevonne Pax on 07/19/2023 15:02:07 -------------------------------------------------------------------------------- Multi Wound Chart Details Patient Name:  Date of Service: Ryan [name] RLES [name]. 07/19/2023 2:30 PM Medical Record Number: [id number] Patient Account Number: [id number] Date of Birth/Sex: Treating RN: February 27, 1971 (52 y.o. [name] Primary Care Chastelyn [place]: [name] Other Clinician: Referring Kooper Godshall: Treating Koree Staheli/Extender: [name] in Treatment: 13 Vital Signs Height(in): 67 Pulse(bpm): 108 Weight(lbs): 338 Blood Pressure(mmHg): 183/88 Body Mass Index(BMI): 52.9 Temperature(F): 97.6 Respiratory Rate(breaths/min): 18 [3:Photos:] Mander, Daichi [name] ([id number]) [3:Photos:] [N/[name]:N/[name]] Right, Anterior Lower Leg N/[name] N/[name] Wound Location: Gradually Appeared N/[name] N/[name] Wounding Event: Diabetic Wound/Ulcer of the Lower N/[name] N/[name] Primary Etiology: Extremity Arrhythmia, Congestive Heart Failure, N/[name] N/[name] Comorbid History: Hypertension, Peripheral Venous Disease, Type II Diabetes 06/21/2023 N/[name] N/[name] Date Acquired: 3 N/[name] N/[name] Weeks of Treatment: Open N/[name] N/[name] Wound Status: No N/[name] N/[name] Wound Recurrence: 0.5x1x0.1 N/[name] N/[name] Measurements L x  W x D (cm) 0.393 N/[name] N/[name] [name] (cm) : rea 0.039 N/[name] N/[name] Volume (cm) : 10.70% N/[name] N/[name] % Reduction in [name] rea: 55.70% N/[name] N/[name] % Reduction in Volume: Grade 1 N/[name] N/[name] Classification: Medium N/[name] N/[name] Exudate [name] mount: Serosanguineous N/[name] N/[name] Exudate Type: red, brown N/[name] N/[name] Exudate Color: Medium (34-66%) N/[name] N/[name] Granulation [name] mount: Red N/[name] N/[name] Granulation Quality: Medium (34-66%) N/[name] N/[name] Necrotic [name] mount: Fat Layer (Subcutaneous Tissue): Yes N/[name] N/[name] Exposed Structures: Fascia: No Tendon: No Muscle: No Joint: No Bone: No None N/[name] N/[name] Epithelialization: Treatment Notes Electronic Signature(s) Signed: 07/20/2023 4:41:11 PM By: Yevonne Pax RN Entered By: Yevonne Pax on 07/19/2023 15:02:12 -------------------------------------------------------------------------------- Multi-Disciplinary Care Plan Details Patient Name: Date of Service: Ryan [name] RLES [name].  07/19/2023 2:30 PM Medical Record Number: [id number] Patient Account Number: [id number] Date of Birth/Sex: Treating RN: November 24, 1970 (52 y.o. [name] Primary Care Negar Sieler: [name] Other Clinician: Referring Ansen Sayegh: Treating Mikella Linsley/Extender: [name] in Treatment: 13 Active Inactive Wound/Skin Impairment Nursing Diagnoses: Knowledge deficit related to ulceration/compromised skin integrity Yeung, Mickel [name] ([id number]) [id number] Page 6 of 9 Goals: Patient/caregiver will verbalize understanding of skin care regimen Date Initiated: 04/18/2023 Date Inactivated: 05/26/2023 Target Resolution Date: 05/19/2023 Goal Status: Met Ulcer/skin breakdown will have [name] volume reduction of 30% by week 4 Date Initiated: 04/18/2023 Date Inactivated: 05/26/2023 Target Resolution Date: 05/19/2023 Goal Status: Met Ulcer/skin breakdown will have [name] volume reduction of 50% by week 8 Date Initiated: 04/18/2023 Date Inactivated: 06/07/2023 Target Resolution Date: 06/18/2023 Goal Status: Met Ulcer/skin breakdown will have [name] volume reduction of 80% by week 12 Date Initiated: 04/18/2023 Target Resolution Date: 07/19/2023 Goal Status: Active Ulcer/skin breakdown will heal within 14 weeks Date Initiated: 04/18/2023 Target Resolution Date: 08/18/2023 Goal Status: Active Interventions: Assess patient/caregiver ability to obtain necessary supplies Assess patient/caregiver ability to perform ulcer/skin care regimen upon admission and as needed Assess ulceration(s) every visit Notes: Electronic Signature(s) Signed: 07/20/2023 4:41:11 PM By: Yevonne Pax RN Entered By: Yevonne Pax on 07/19/2023 15:02:19 -------------------------------------------------------------------------------- Pain Assessment Details Patient Name: Date of Service: JJESUS, HOGGLE [name]. 07/19/2023 2:30 PM Medical Record Number: [id number] Patient Account Number: [id number] Date  of Birth/Sex: Treating RN: 03/29/1971 (52 y.o. [name] Primary Care Lenia Housley: [name] Other Clinician: Referring Zuria Fosdick: Treating Kimberly Coye/Extender: [name] in Treatment: 13 Active Problems Location of Pain Severity and Description of Pain Patient Has Paino No Site Locations Pain Management and Medication ARATA, ALLGIRE [name] ([id number]) [id number] Page 7 of 9 Current Pain Management: Electronic Signature(s) Signed: 07/20/2023 4:41:11 PM By: Yevonne Pax RN Entered By: Yevonne Pax on 07/19/2023 14:56:16 -------------------------------------------------------------------------------- Patient/Caregiver Education Details Patient Name: Date of Service: Christen Bame. 11/19/2024andnbsp2:30 PM Medical Record Number: [id number] Patient Account Number: [id number] Date of Birth/Gender: Treating RN: 1970-12-08 (52 y.o. [name] Primary Care Physician: [name] Other Clinician: Referring Physician: Treating Physician/Extender: [name] in Treatment: 13 Education Assessment Education Provided To: Patient Education Topics Provided Wound/Skin Impairment: Handouts: Caring for Your Ulcer Methods: Explain/Verbal Responses: State content correctly Electronic Signature(s) Signed: 07/20/2023 4:41:11 PM By: Yevonne Pax RN Entered By: Yevonne Pax on 07/19/2023 15:02:31 -------------------------------------------------------------------------------- Wound Assessment Details Patient Name: Date of Service: VIKASH, STENMAN [name]. 07/19/2023 2:30 PM Medical Record Number: [id number] Patient Account Number: [id number] Date of Birth/Sex: Treating RN: 1971/05/21 (52 y.o. [name] Primary Care Angelyn Osterberg: [name] Other Clinician: Referring Saki Legore: Treating Harless Molinari/Extender: [name] in Treatment: 13 Wound Status Wound  Number: 3 Primary Diabetic  Wound/Ulcer of the Lower Extremity Etiology: Wound Location: Right, Anterior Lower Leg Wound Open Wounding Event: Gradually KENN, SCHNOOR [name] ([id number]) [id number] Page 8 of 9 Wounding Event: Gradually Appeared Status: Date Acquired: 06/21/2023 Comorbid Arrhythmia, Congestive Heart Failure, Hypertension, Peripheral Weeks Of Treatment: 3 History: Venous Disease, Type II Diabetes Clustered Wound: No Photos Wound Measurements Length: (cm) 0.5 Width: (cm) 1 Depth: (cm) 0.1 Area: (cm) 0.393 Volume: (cm) 0.039 % Reduction in Area: 10.7% % Reduction in Volume: 55.7% Epithelialization: None Tunneling: No Undermining: No Wound Description Classification: Grade 1 Exudate Amount: Medium Exudate Type: Serosanguineous Exudate Color: red, brown Foul Odor After Cleansing: No Slough/Fibrino Yes Wound Bed Granulation Amount: Medium (34-66%) Exposed Structure Granulation Quality: Red Fascia Exposed: No Necrotic Amount: Medium (34-66%) Fat Layer (Subcutaneous Tissue) Exposed: Yes Necrotic Quality: Adherent Slough Tendon Exposed: No Muscle Exposed: No Joint Exposed: No Bone Exposed: No Treatment Notes Wound #3 (Lower Leg) Wound Laterality: Right, Anterior Cleanser Soap and Water Discharge Instruction: Gently cleanse wound with antibacterial soap, rinse and pat dry prior to dressing wounds Wound Cleanser Discharge Instruction: Wash your hands with soap and water. Remove old dressing, discard into plastic bag and place into trash. Cleanse the wound with Wound Cleanser prior to applying [name] clean dressing using gauze sponges, not tissues or cotton balls. Do not scrub or use excessive force. Pat dry using gauze sponges, not tissue or cotton balls. Peri-Wound Care Topical Primary Dressing Hydrofera Blue Ready Transfer Foam, 2.5x2.5 (in/in) Discharge Instruction: Apply Hydrofera Blue Ready to wound bed as directed Secondary Dressing ABD Pad 5x9  (in/in) Discharge Instruction: Cover with ABD pad Secured With Compression Wrap Urgo K2, two layer compression system, large Compression Stockings Add-Ons Electronic Signature(s) Signed: 07/20/2023 4:41:11 PM By: Yevonne Pax RN Neddo, Braidyn A11/20/2024 4:41:11 PM By: Yevonne Pax RN Signed: ([id number]) [id number] Page 9 of 9 Entered By: Yevonne Pax on 07/19/2023 15:00:12 -------------------------------------------------------------------------------- Vitals Details Patient Name: Date of Service: HERNANDEZ, SAURO [name]. 07/19/2023 2:30 PM Medical Record Number: [id number] Patient Account Number: [id number] Date of Birth/Sex: Treating RN: [birth year] (52 y.o. Ryan [name]) Yevonne Pax Primary Care Brynna Dobos: [name] Other Clinician: Referring Teshaun Olarte: Treating Antwion Carpenter/Extender: [name] in Treatment: 13 Vital Signs Time Taken: 14:55 Temperature (F): 97.6 Height (in): 67 Pulse (bpm): 108 Weight (lbs): 338 Respiratory Rate (breaths/min): 18 Body Mass Index (BMI): 52.9 Blood Pressure (mmHg): 183/88 Reference Range: 80 - 120 mg / dl Electronic Signature(s) Signed: 07/20/2023 4:41:11 PM By: Yevonne Pax RN Entered By: Yevonne Pax on 07/19/2023 14:56:02

## 2023-07-20 NOTE — Assessment & Plan Note (Signed)
Chronic, ongoing.  Rate stable today.  Continue current medication regimen and collaboration with cardiology.  He is transferring care to Dr. Darrick Huntsman on 08/18/23, will follow-up with her.

## 2023-07-20 NOTE — Telephone Encounter (Signed)
Ryan [name] with [name] brought by Patient Enrollment Form requesting to be completed by [name], NP. Please fax form when completed. Any questions please contact Ryan [name] at [phone]. I am placing form in providers folder.

## 2023-07-20 NOTE — Assessment & Plan Note (Signed)
Chronic due to A. Fib and chronic DVT.  Will continue current dose of 5 MG daily as INR yesterday was 2.1.  He is now going to be following with INR clinic.  He is transferring care to Dr. Darrick Huntsman on 08/18/23, will follow-up with her.

## 2023-07-21 NOTE — Telephone Encounter (Signed)
Patient said he was going to the INR clinic yesterday when he had a visit with Jolene.  If he wants to do home INR, he will have to wait till he see's his new PCP.

## 2023-07-21 NOTE — Telephone Encounter (Signed)
Called and notified Ryan [name] of Karen's response.

## 2023-07-23 ENCOUNTER — Telehealth (INDEPENDENT_AMBULATORY_CARE_PROVIDER_SITE_OTHER): Payer: No Typology Code available for payment source | Admitting: Pulmonary Disease

## 2023-07-23 DIAGNOSIS — G4733 Obstructive sleep apnea (adult) (pediatric): Secondary | ICD-10-CM | POA: Diagnosis not present

## 2023-07-23 NOTE — Telephone Encounter (Signed)
He was transported to ED for sdhortnes of brath Only 15 mins of sleep time noted. He will need a repeat diagnostic study

## 2023-07-25 ENCOUNTER — Encounter: Payer: Self-pay | Admitting: Nurse Practitioner

## 2023-07-26 ENCOUNTER — Encounter: Payer: Self-pay | Admitting: Pulmonary Disease

## 2023-07-26 ENCOUNTER — Encounter: Payer: No Typology Code available for payment source | Admitting: Physician Assistant

## 2023-07-26 DIAGNOSIS — E11622 Type 2 diabetes mellitus with other skin ulcer: Secondary | ICD-10-CM | POA: Diagnosis not present

## 2023-07-26 NOTE — Progress Notes (Signed)
KETCHER, LOTTMAN A ([id number]) [id number] Page 1 of 10 Visit Report for 07/26/2023 Arrival Information Details Patient Name: Date of Service: SLONE, FAZEL RLES A. 07/26/2023 8:00 A M Medical Record Number: [id number] Patient Account Number: [id number] Date of Birth/Sex: Treating RN: 03-Apr-1971 (52 y.o. [name]) Yevonne Pax Primary Care Shannell Mikkelsen: [name] Other Clinician: Referring Stran Raper: Treating Marget Outten/Extender: [name] in Treatment: 14 Visit Information History Since Last Visit Added or deleted any medications: No Patient Arrived: Ambulatory Any new allergies or adverse reactions: No Arrival Time: 08:03 Had a fall or experienced change in No Accompanied By: self activities of daily living that may affect Transfer Assistance: None risk of falls: Patient Identification Verified: Yes Signs or symptoms of abuse/neglect since last visito No Secondary Verification Process Completed: Yes Hospitalized since last visit: No Patient Requires Transmission-Based Precautions: No Implantable device outside of the clinic excluding No Patient Has Alerts: Yes cellular tissue based products placed in the center Patient Alerts: Patient on Blood Thinner since last visit: diabetic Has Dressing in Place as Prescribed: Yes Has Compression in Place as Prescribed: Yes Pain Present Now: No Electronic Signature(s) Signed: 07/26/2023 4:31:34 PM By: Yevonne Pax RN Entered By: Yevonne Pax on 07/26/2023 05:07:37 -------------------------------------------------------------------------------- Clinic Level of Care Assessment Details Patient Name: Date of Service: DEYON, WESSON A. 07/26/2023 8:00 A M Medical Record Number: [id number] Patient Account Number: [id number] Date of Birth/Sex: Treating RN: 04/10/1971 (52 y.o. [name]) Yevonne Pax Primary Care Arly Salminen: [name] Other Clinician: Referring Lamichael Youkhana: Treating Ashur Glatfelter/Extender: [name] in Treatment: 14 Clinic Level of Care Assessment Items TOOL 1 Quantity Score []  - 0 Use when EandM and Procedure is performed on INITIAL visit ASSESSMENTS - Nursing Assessment / Reassessment []  - 0 General Physical Exam (combine w/ comprehensive assessment (listed just below) when performed on new pt. evals) []  - 0 Comprehensive Assessment (HX, ROS, Risk Assessments, Wounds Hx, etc.) Mccauslin, Jhostin A ([id number]) [id number] Page 2 of 10 ASSESSMENTS - Wound and Skin Assessment / Reassessment []  - 0 Dermatologic / Skin Assessment (not related to wound area) ASSESSMENTS - Ostomy and/or Continence Assessment and Care []  - 0 Incontinence Assessment and Management []  - 0 Ostomy Care Assessment and Management (repouching, etc.) PROCESS - Coordination of Care []  - 0 Simple Patient / Family Education for ongoing care []  - 0 Complex (extensive) Patient / Family Education for ongoing care []  - 0 Staff obtains Chiropractor, Records, T Results / Process Orders est []  - 0 Staff telephones HHA, Nursing Homes / Clarify orders / etc []  - 0 Routine Transfer to another Facility (non-emergent condition) []  - 0 Routine Hospital Admission (non-emergent condition) []  - 0 New Admissions / Manufacturing engineer / Ordering NPWT Apligraf, etc. , []  - 0 Emergency Hospital Admission (emergent condition) PROCESS - Special Needs []  - 0 Pediatric / Minor Patient Management []  - 0 Isolation Patient Management []  - 0 Hearing / Language / Visual special needs []  - 0 Assessment of Community assistance (transportation, D/C planning, etc.) []  - 0 Additional assistance / Altered mentation []  - 0 Support Surface(s) Assessment (bed, cushion, seat, etc.) INTERVENTIONS - Miscellaneous []  - 0 External ear exam []  - 0 Patient Transfer (multiple staff / Nurse, adult / Similar devices) []  - 0 Simple Staple / Suture removal (25 or less) []  - 0 Complex  Staple / Suture removal (26 or more) []  - 0 Hypo/Hyperglycemic Management (do not check if billed separately) []  - 0 Ankle / Brachial Index (ABI) - do not  check if billed separately Has the patient been seen at the hospital within the last three years: Yes Total Score: 0 Level Of Care: ____ Electronic Signature(s) Signed: 07/26/2023 4:31:34 PM By: Yevonne Pax RN Entered By: Yevonne Pax on 07/26/2023 10:11:47 -------------------------------------------------------------------------------- Compression Therapy Details Patient Name: Date of Service: [name] 07/26/2023 8:00 A M Medical Record Number: [id number] Patient Account Number: [id number] Date of Birth/Sex: Treating RN: April 14, 1971 (52 y.o. [name] Primary Care Izaak Sahr: [name] Other Clinician: Referring Taliya Mcclard: Treating Blanche Gallien/Extender: Jedi, Barboza A ([id number]) [id number] Page 3 of 10 Weeks in Treatment: 14 Compression Therapy Performed for Wound Assessment: Wound #3 Right,Anterior Lower Leg Performed By: Clinician Yevonne Pax, RN Compression Type: Double Layer Post Procedure Diagnosis Same as Pre-procedure Electronic Signature(s) Signed: 07/26/2023 1:11:15 PM By: Yevonne Pax RN Entered By: Yevonne Pax on 07/26/2023 10:11:15 -------------------------------------------------------------------------------- Compression Therapy Details Patient Name: Date of Service: [name] 07/26/2023 8:00 A M Medical Record Number: [id number] Patient Account Number: [id number] Date of Birth/Sex: Treating RN: 1970-09-20 (52 y.o. [name] Primary Care Deyjah Kindel: [name] Other Clinician: Referring Glorine Hanratty: Treating Lamine Laton/Extender: [name] in Treatment: 14 Compression Therapy Performed for Wound Assessment: Non-Wound Location Performed By: Clinician Yevonne Pax, RN Compression Type: Double  Layer Location: Lower Extremity, Left Post Procedure Diagnosis Same as Pre-procedure Electronic Signature(s) Signed: 07/26/2023 1:11:38 PM By: Yevonne Pax RN Entered By: Yevonne Pax on 07/26/2023 10:11:38 -------------------------------------------------------------------------------- Encounter Discharge Information Details Patient Name: Date of Service: [name] 07/26/2023 8:00 A M Medical Record Number: [id number] Patient Account Number: [id number] Date of Birth/Sex: Treating RN: Sep 27, 1970 (52 y.o. [name] Primary Care Madylyn Insco: [name] Other Clinician: Referring Emalene Welte: Treating Bell Carbo/Extender: [name] in Treatment: 14 Encounter Discharge Information Items Discharge Condition: Stable Ambulatory Status: Ambulatory Discharge Destination: Home IVAR, PERSKY A ([id number]) [id number] Page 4 of 10 Transportation: Private Auto Accompanied By: self Schedule Follow-up Appointment: Yes Clinical Summary of Care: Electronic Signature(s) Signed: 07/26/2023 1:12:54 PM By: Yevonne Pax RN Entered By: Yevonne Pax on 07/26/2023 10:12:54 -------------------------------------------------------------------------------- Lower Extremity Assessment Details Patient Name: Date of Service: DASHON, CONFER RLES A. 07/26/2023 8:00 A M Medical Record Number: [id number] Patient Account Number: [id number] Date of Birth/Sex: Treating RN: March 28, 1971 (52 y.o. [name]) Yevonne Pax Primary Care Kyriakos Babler: [name] Other Clinician: Referring Shereka Lafortune: Treating Jospeh Mangel/Extender: [name] in Treatment: 14 Edema Assessment Assessed: [name]: No] [Right: No] [Left: Edema] [Right: :] Calf Left: Right: Point of Measurement: 40 cm From Medial Instep 50 cm 51 cm Ankle Left: Right: Point of Measurement: 10 cm From Medial Instep 30 cm 29 cm Knee To Floor Left: Right: From Medial Instep 44 cm 44  cm Vascular Assessment Pulses: Dorsalis Pedis Palpable: [Left:Yes] [Right:Yes] Extremity colors, hair growth, and conditions: Extremity Color: [Left:Hyperpigmented] [Right:Hyperpigmented] Hair Growth on Extremity: [Left:No] [Right:No] Temperature of Extremity: [Left:Warm] [Right:Warm] Capillary Refill: [Left:< 3 seconds] [Right:< 3 seconds] Dependent Rubor: [Left:No] [Right:No] Blanched when Elevated: [Left:No No] [Right:No No] Toe Nail Assessment Left: Right: Thick: Yes Yes Discolored: Yes Yes Deformed: Yes Yes Improper Length and Hygiene: Yes Yes Electronic Signature(s) Signed: 07/26/2023 4:31:34 PM By: Yevonne Pax RN Lewey, Ethaniel A11/26/2024 4:31:34 PM By: Yevonne Pax RN Signed: ([id number]) [id number] Page 5 of 10 Entered By: Yevonne Pax on 07/26/2023 05:16:15 -------------------------------------------------------------------------------- Multi Wound Chart Details Patient Name: Date of Service: YUSUKE, LIMBACH RLES A. 07/26/2023 8:00 A M Medical Record Number: [id number] Patient Account Number: [id number] Date of Birth/Sex:  Treating RN: 06-14-1971 (52 y.o. [name]) Yevonne Pax Primary Care Nattie Lazenby: [name] Other Clinician: Referring Darneshia Demary: Treating Kacy Hegna/Extender: [name] in Treatment: 14 Vital Signs Height(in): 67 Pulse(bpm): 84 Weight(lbs): 338 Blood Pressure(mmHg): 152/99 Body Mass Index(BMI): 52.9 Temperature(F): 97.8 Respiratory Rate(breaths/min): 18 [3:Photos:] [N/A:N/A] Right, Anterior Lower Leg N/A N/A Wound Location: Gradually Appeared N/A N/A Wounding Event: Diabetic Wound/Ulcer of the Lower N/A N/A Primary Etiology: Extremity Arrhythmia, Congestive Heart Failure, N/A N/A Comorbid History: Hypertension, Peripheral Venous Disease, Type II Diabetes 06/21/2023 N/A N/A Date Acquired: 4 N/A N/A Weeks of Treatment: Open N/A N/A Wound Status: No N/A N/A Wound Recurrence: 0.4x1.2x0.1 N/A  N/A Measurements L x W x D (cm) 0.377 N/A N/A A (cm) : rea 0.038 N/A N/A Volume (cm) : 14.30% N/A N/A % Reduction in A rea: 56.80% N/A N/A % Reduction in Volume: Grade 1 N/A N/A Classification: Medium N/A N/A Exudate A mount: Serosanguineous N/A N/A Exudate Type: red, brown N/A N/A Exudate Color: Medium (34-66%) N/A N/A Granulation A mount: Red N/A N/A Granulation Quality: Medium (34-66%) N/A N/A Necrotic A mount: Fat Layer (Subcutaneous Tissue): Yes N/A N/A Exposed Structures: Fascia: No Tendon: No Muscle: No Joint: No Bone: No None N/A N/A Epithelialization: Treatment Notes Electronic Signature(s) Wilner, Hillman A ([id number]) [id number] Page 6 of 10 Signed: 07/26/2023 4:31:34 PM By: Yevonne Pax RN Entered By: Yevonne Pax on 07/26/2023 05:16:22 -------------------------------------------------------------------------------- Multi-Disciplinary Care Plan Details Patient Name: Date of Service: [name] 07/26/2023 8:00 A M Medical Record Number: [id number] Patient Account Number: [id number] Date of Birth/Sex: Treating RN: 12-Sep-1970 (52 y.o. [name]) Yevonne Pax Primary Care Benito Lemmerman: [name] Other Clinician: Referring Shane Melby: Treating Giannina Bartolome/Extender: [name] in Treatment: 14 Active Inactive Wound/Skin Impairment Nursing Diagnoses: Knowledge deficit related to ulceration/compromised skin integrity Goals: Patient/caregiver will verbalize understanding of skin care regimen Date Initiated: 04/18/2023 Date Inactivated: 05/26/2023 Target Resolution Date: 05/19/2023 Goal Status: Met Ulcer/skin breakdown will have a volume reduction of 30% by week 4 Date Initiated: 04/18/2023 Date Inactivated: 05/26/2023 Target Resolution Date: 05/19/2023 Goal Status: Met Ulcer/skin breakdown will have a volume reduction of 50% by week 8 Date Initiated: 04/18/2023 Date Inactivated: 06/07/2023 Target Resolution  Date: 06/18/2023 Goal Status: Met Ulcer/skin breakdown will have a volume reduction of 80% by week 12 Date Initiated: 04/18/2023 Date Inactivated: 07/26/2023 Target Resolution Date: 07/19/2023 Unmet Reason: comorbidities and non Goal Status: Unmet complaince Ulcer/skin breakdown will heal within 14 weeks Date Initiated: 04/18/2023 Target Resolution Date: 08/18/2023 Goal Status: Active Interventions: Assess patient/caregiver ability to obtain necessary supplies Assess patient/caregiver ability to perform ulcer/skin care regimen upon admission and as needed Assess ulceration(s) every visit Notes: Electronic Signature(s) Signed: 07/26/2023 4:31:34 PM By: Yevonne Pax RN Entered By: Yevonne Pax on 07/26/2023 05:16:53 Rosenbloom, Bing Neighbors ([id number]) [id number] Page 7 of 10 -------------------------------------------------------------------------------- Pain Assessment Details Patient Name: Date of Service: JENTZEN, NAJARIAN RLES A. 07/26/2023 8:00 A M Medical Record Number: [id number] Patient Account Number: [id number] Date of Birth/Sex: Treating RN: 22-Sep-1970 (52 y.o. [name]) Yevonne Pax Primary Care Cariann Kinnamon: [name] Other Clinician: Referring Archer Moist: Treating Jaryiah Mehlman/Extender: [name] in Treatment: 14 Active Problems Location of Pain Severity and Description of Pain Patient Has Paino No Site Locations Pain Management and Medication Current Pain Management: Electronic Signature(s) Signed: 07/26/2023 4:31:34 PM By: Yevonne Pax RN Entered By: Yevonne Pax on 07/26/2023 05:08:03 -------------------------------------------------------------------------------- Patient/Caregiver Education Details Patient Name: Date of Service: [name] 11/26/2024andnbsp8:00 A M Medical Record Number: [id number] Patient Account Number:  [id number] Date of Birth/Gender: Treating RN: [birth year] (52 y.o. [name]) Yevonne Pax Primary Care Physician:  [name] Other Clinician: Referring Physician: Treating Physician/Extender: [name] in Treatment: [address] [place] A ([id number]) [id number] Page 8 of 10 Education Assessment Education Provided To: Patient Education Topics Provided Wound/Skin Impairment: Handouts: Caring for Your Ulcer Methods: Explain/Verbal Responses: State content correctly Electronic Signature(s) Signed: 07/26/2023 4:31:34 PM By: Yevonne Pax RN Entered By: Yevonne Pax on 07/26/2023 05:17:03 -------------------------------------------------------------------------------- Wound Assessment Details Patient Name: Date of Service: [name] 07/26/2023 8:00 A M Medical Record Number: [id number] Patient Account Number: [id number] Date of Birth/Sex: Treating RN: 28-Dec-1970 (52 y.o. [name]) Yevonne Pax Primary Care Viriginia Amendola: [name] Other Clinician: Referring Johnathin Vanderschaaf: Treating Berlyn Malina/Extender: [name] in Treatment: 14 Wound Status Wound Number: 3 Primary Diabetic Wound/Ulcer of the Lower Extremity Etiology: Wound Location: Right, Anterior Lower Leg Wound Open Wounding Event: Gradually Appeared Status: Date Acquired: 06/21/2023 Comorbid Arrhythmia, Congestive Heart Failure, Hypertension, Peripheral Weeks Of Treatment: 4 History: Venous Disease, Type II Diabetes Clustered Wound: No Photos Wound Measurements Length: (cm) 0.4 Width: (cm) 1.2 Depth: (cm) 0.1 Area: (cm) 0.377 Volume: (cm) 0.038 % Reduction in Area: 14.3% % Reduction in Volume: 56.8% Epithelialization: None Tunneling: No Undermining: No Wound Description Classification: Grade 1 Exudate Amount: Medium Exudate Type: Serosanguineous Regner, Estiven A ([id number]) Exudate Color: red, brown Foul Odor After Cleansing: No Slough/Fibrino Yes [id number] Page 9 of 10 Wound Bed Granulation Amount: Medium (34-66%)  Exposed Structure Granulation Quality: Red Fascia Exposed: No Necrotic Amount: Medium (34-66%) Fat Layer (Subcutaneous Tissue) Exposed: Yes Necrotic Quality: Adherent Slough Tendon Exposed: No Muscle Exposed: No Joint Exposed: No Bone Exposed: No Treatment Notes Wound #3 (Lower Leg) Wound Laterality: Right, Anterior Cleanser Soap and Water Discharge Instruction: Gently cleanse wound with antibacterial soap, rinse and pat dry prior to dressing wounds Wound Cleanser Discharge Instruction: Wash your hands with soap and water. Remove old dressing, discard into plastic bag and place into trash. Cleanse the wound with Wound Cleanser prior to applying a clean dressing using gauze sponges, not tissues or cotton balls. Do not scrub or use excessive force. Pat dry using gauze sponges, not tissue or cotton balls. Peri-Wound Care Topical Primary Dressing Hydrofera Blue Ready Transfer Foam, 2.5x2.5 (in/in) Discharge Instruction: Apply Hydrofera Blue Ready to wound bed as directed Secondary Dressing ABD Pad 5x9 (in/in) Discharge Instruction: Cover with ABD pad Secured With Compression Wrap Urgo K2, two layer compression system, large Compression Stockings Add-Ons Electronic Signature(s) Signed: 07/26/2023 4:31:34 PM By: Yevonne Pax RN Entered By: Yevonne Pax on 07/26/2023 05:14:30 -------------------------------------------------------------------------------- Vitals Details Patient Name: Date of Service: [name] 07/26/2023 8:00 A M Medical Record Number: [id number] Patient Account Number: [id number] Date of Birth/Sex: Treating RN: 1971/02/26 (52 y.o. [name]) Yevonne Pax Primary Care Tevon Berhane: [name] Other Clinician: Referring Sylas Twombly: Treating Brianna Bennett/Extender: [name] in Treatment: 14 Vital Signs Time Taken: 08:07 Temperature (F): 97.8 Height (in): 67 Pulse (bpm): 84 Weight (lbs): 338 Respiratory Rate (breaths/min): 18 Body Mass  Index (BMI): 52.9 Blood Pressure (mmHg): 152/99 Picking, Morty A ([id number]) [id number] Page 10 of 10 Reference Range: 80 - 120 mg / dl Electronic Signature(s) Signed: 07/26/2023 4:31:34 PM By: Yevonne Pax RN Entered By: Yevonne Pax on 07/26/2023 05:07:56

## 2023-07-27 ENCOUNTER — Ambulatory Visit: Payer: No Typology Code available for payment source | Attending: Cardiology

## 2023-07-27 DIAGNOSIS — Z5181 Encounter for therapeutic drug level monitoring: Secondary | ICD-10-CM

## 2023-07-27 DIAGNOSIS — I4891 Unspecified atrial fibrillation: Secondary | ICD-10-CM

## 2023-07-27 DIAGNOSIS — I2699 Other pulmonary embolism without acute cor pulmonale: Secondary | ICD-10-CM

## 2023-07-27 LAB — POCT INR: INR: 2.8 (ref 2.0–3.0)

## 2023-07-27 NOTE — Progress Notes (Signed)
THEODRIC, GARBO [name] ([id number]) [id number] Page 1 of 8 Visit Report for 07/26/2023 Chief Complaint Document Details Patient Name: Date of Service: Ryan [name], Ryan [name]. 07/26/2023 8:00 [name] Medical Record Number: [id number] Patient Account Number: [id number] Date of Birth/Sex: Treating RN: 1971-04-24 (52 y.o. Ryan [name]) Ryan [name] Primary Care Provider: Larae [name] Other Clinician: Referring Provider: Treating Provider/Extender: Ryan [name] in Treatment: 14 Information Obtained from: Patient Chief Complaint Left leg and left foot ulcers Electronic Signature(s) Signed: 07/26/2023 8:37:11 AM By: Ryan Derry PA-C Entered By: Ryan [name] on 07/26/2023 08:37:11 -------------------------------------------------------------------------------- HPI Details Patient Name: Date of Service: Ryan [name]. 07/26/2023 8:00 [name] Medical Record Number: [id number] Patient Account Number: [id number] Date of Birth/Sex: Treating RN: August 24, 1971 (52 y.o. Ryan [name] Primary Care Provider: Larae [name] Other Clinician: Referring Provider: Treating Provider/Extender: Ryan [name] in Treatment: 14 History of Present Illness Chronic/Inactive Conditions Condition 1: 04-18-2023 patient screening ABI today was 0.83 and this was on the left. With that being said he had previous ABIs done formally on 06-25-2021 with [name] right ABI of 1.12 and [name] TBI of 1.03 on the left she had an ABI of 1.12 with [name] TBI of 0.98. HPI Description: 04-18-2023 upon evaluation today patient presents for initial inspection here in our clinic some concerning issues he is having with his left proximal foot as well as his left lower extremity. This actually appears to be more of [name] situation to be honest. He does have chronic lower extremity swelling secondary to what appears to be venous stasis he has not really had any wounds significantly before and tells  me that it has been [name] probably even 12 or so since he has last used his lymphedema pumps. He is not even sure if they are actually functioning anymore or not. With that being said this is 1 thing that I did want him to look into. He is on [name] fluid pill and he does not wear compression socks on [name] regular basis although I definitely think that something that he also really needs to be doing to be honest. He does not have any injury that occurred to cause these wounds they more or less spontaneously occurred as [name] result of what appears to be excessive swelling. Patient does have [name] history of diabetes mellitus type 2, congestive heart failure, lymphedema, venous hypertension, hypertension, peripheral vascular disease, and chronic kidney disease stage III. 8/26; this is [name] patient with chronic lymphedema. He has wounds on the left medial lower extremity and left medial foot. We have been using Hydrofera Blue and Urgo K2. He tells me he has [name] compression pump at home although it is missing apart and he is not really familiar with with which company to call. He also started on furosemide 20 mg every other day probably would do better with every day and he is talking to his physician about this. Ryan [name] ([id number]) [id number] Page 2 of 8 05-05-2023 upon evaluation today patient appears to be doing pretty well currently in regard to his wound. Fortunately there does not appear to be any signs of infection he does have some slough and biofilm noted I did discuss with the patient today I do believe that he would benefit from [name] continuation of therapy with regard to his wound. I think we may need to increase the compression however and also think that he may benefit from targeted debridement today. 05-09-2023 upon evaluation today patient  appears to be doing well currently in regard to his wound on the leg. He and both locations seems to be showing signs of  improvement which is great news and in general I do believe there were making excellent headway towards complete closure which is great news. I do not see any signs of active infection at this time which is also excellent news. 05-16-2023 upon evaluation today patient appears to be doing well currently with regard to his wound we did actually have [name] check at this today but that is not the biggest thing I am concerned about at this point. He unfortunately is having some issues here with oxygen saturation dropping to around 84% times and then bouncing back up and down he is breathing rapidly he is also fluctuating with regard to pulse anywhere from 88 that I got manually to it at some points even up at 110 his body does seem to be compensating. He tells me that he sleeps with oxygen but does not wear it during the day typically he has been short of breath for [name] little under [name] week. Shortly after he was here last week. With that being said the patient tells me that he does have [name] history of pulmonary embolisms. He also tells me that he has been having some issues catching his breath with ambulation as well which is also [name] big part of the issue at this point. Fortunately I do not see any signs of obvious infection and is listening to him I could not tell any abnormal breath sounds although he may be [name] little bit restricted although I cannot be for sure. He tells me that the only thing he may have had [name] little bit of chills but nothing else significant. Nonetheless I think he needs to be evaluated at the ER ASAP. 05-26-2023 upon evaluation today patient appears to be doing well currently in regard to his wounds which are actually significantly better. He did have [name] hospital course where he just got out yesterday he ended up having pulmonary emboli at multiple in fact I think I read 14 that were present and had to be removed. He is now on additional blood thinners and states that he is in [name] good place as far as  the blood thinners are concerned. He is feeling much better as well able to breathe quite nicely. I am very pleased with where things stand currently and do not see any signs of infection at this time which is also good news. 05-31-2023 upon evaluation today patient appears to be doing well with regard to his wound one of them is healed the other is getting close. Unfortunately he is having issues again with his breathing today oxygen saturation around 87 to 90% in general during the evaluation at this point. This has me concerned again as he had previous pulmonary emboli does have an appointment with pulmonology tomorrow but I do not think he needs to wait till tomorrow. 06-07-2023 upon evaluation today patient appears to be doing well currently in regard to his wound. He is tolerating the dressing changes this looks to be doing better in general I think removing in the right direction. I do not see any signs of active infection at this time. No fevers, chills, nausea, vomiting, or diarrhea. 06-28-2023 upon evaluation today patient appears to be doing [name] little worse in regard to his wounds due to the fact that unfortunately this has broken down after he was in the hospital most recently.  Again this is the biggest concern that I have at this point. I do believe that he is going to have to go back into compression to get these under control and that he is gena need ongoing compression stockings. 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 His oxygen saturation remains low even today in the clinic with just sitting and not even talking he was bouncing between 88 and 90 I am not so sure he should not have oxygen 24/7 at least at low levels. 07-05-2023 upon evaluation today patient appears to be doing well currently in regard to his wound. In fact the left leg is doing significantly better in fact appears to be healed. The right leg is improving but at the same time is not completely healed as of yet. I  fortunately do not see any signs of active infection locally or systemically at this time. No fevers, chills, nausea, vomiting, or diarrhea. 07-12-2023 upon evaluation today patient unfortunately comes in today his wrap side slid down quite significantly on his legs. Fortunately I do not see any signs of infection but at the same time the wound on the right leg is still open at this point we are working on ordering the juxta fit compression wraps. 07-19-2023 upon evaluation today patient appears to be doing well currently in regard to his wounds. He has been tolerating the dressing changes without complication. Wound does seem to be doing much better. It is going require some sharp debridement. 07-26-2023 upon evaluation today patient appears to be doing decently well in regard to his wounds at this point. Fortunately I do not see any signs of active infection which is good news and overall I am very pleased with where we stand today. Electronic Signature(s) Signed: 07/26/2023 5:17:11 PM By: Ryan Derry PA-C Entered By: Ryan [name] on 07/26/2023 17:17:11 -------------------------------------------------------------------------------- Physical Exam Details Patient Name: Date of Service: KYNGSTON, AMORIN RLES [name]. 07/26/2023 8:00 [name] Medical Record Number: [id number] Patient Account Number: [id number] Date of Birth/Sex: Treating RN: 1970/09/13 (52 y.o. Ryan [name] Primary Care Provider: Larae [name] Other Clinician: Referring Provider: Treating Provider/Extender: Ryan [name] in Treatment: 14 Constitutional Obese and well-hydrated in no acute distress. Respiratory normal breathing without difficulty. Ryan [name], Ryan [name] ([id number]) [id number] Page 3 of 8 Psychiatric this patient is able to make decisions and demonstrates good insight into disease process. Alert and Oriented x 3. pleasant and cooperative. Notes Upon inspection patient's wound  bed actually showed signs of good granulation epithelization he is actually doing quite well the biggest thing is he just needs to get his compression stockings as we need to start transitioning him into using the compression stockings especially on this left leg. Electronic Signature(s) Signed: 07/26/2023 5:17:35 PM By: Ryan Derry PA-C Entered By: Ryan [name] on 07/26/2023 17:17:35 -------------------------------------------------------------------------------- Physician Orders Details Patient Name: Date of Service: Ryan [name]. 07/26/2023 8:00 [name] Medical Record Number: [id number] Patient Account Number: [id number] Date of Birth/Sex: Treating RN: September 14, 1970 (52 y.o. Ryan [name] Primary Care Provider: Larae [name] Other Clinician: Referring Provider: Treating Provider/Extender: Ryan [name] in Treatment: 14 The following information was scribed by: Ryan [name] The information was scribed for: Ryan [name] Verbal / Phone Orders: No Diagnosis Coding Follow-up Appointments Return Appointment in 1 week. Bathing/ Shower/ Hygiene May shower with wound dressing protected with water repellent cover or cast protector. Anesthetic (Use 'Patient Medications' Section for Anesthetic Order Entry) Lidocaine applied to wound bed Edema Control - Orders / Instructions  UrgoK2 - left lower leg Elevate, Exercise Daily and [name] void Standing for Long Periods of Time. Elevate legs to the level of the heart and pump ankles as often as possible Elevate leg(s) parallel to the floor when sitting. Wound Treatment Wound #3 - Lower Leg Wound Laterality: Right, Anterior Cleanser: Soap and Water 1 x Per Week/30 Days Discharge Instructions: Gently cleanse wound with antibacterial soap, rinse and pat dry prior to dressing wounds Cleanser: Wound Cleanser 1 x Per Week/30 Days Discharge Instructions: Wash your hands with soap and water. Remove old dressing, discard into plastic  bag and place into trash. Cleanse the wound with Wound Cleanser prior to applying [name] clean dressing using gauze sponges, not tissues or cotton balls. Do not scrub or use excessive force. Pat dry using gauze sponges, not tissue or cotton balls. Prim Dressing: Hydrofera Blue Ready Transfer Foam, 2.5x2.5 (in/in) 1 x Per Week/30 Days ary Discharge Instructions: Apply Hydrofera Blue Ready to wound bed as directed Secondary Dressing: ABD Pad 5x9 (in/in) 1 x Per Week/30 Days Discharge Instructions: Cover with ABD pad Compression Wrap: Urgo K2, two layer compression system, large 1 x Per Week/30 Days Electronic Signature(s) Signed: 07/26/2023 4:31:34 PM By: Ryan Pax RN Nedra Hai, Ryan [name] ([id number]) [id number] Page 4 of 8 Signed: 07/26/2023 5:34:59 PM By: Ryan Derry PA-C Entered By: Ryan [name] on 07/26/2023 13:12:06 -------------------------------------------------------------------------------- Problem List Details Patient Name: Date of Service: Ryan [name]. 07/26/2023 8:00 [name] Medical Record Number: [id number] Patient Account Number: [id number] Date of Birth/Sex: Treating RN: Apr 19, 1971 (52 y.o. 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/19/2024 No Yes E11.621 Type 2 diabetes mellitus with foot ulcer 04/18/2023 No Yes L97.522 Non-pressure chronic ulcer of other part of left foot with fat layer exposed 04/18/2023 No Yes I50.42 Chronic combined systolic (congestive) and diastolic (congestive) heart failure 04/18/2023 No Yes I89.0 Lymphedema, not elsewhere classified 04/18/2023 No Yes I87.332  Chronic venous hypertension (idiopathic) with ulcer and inflammation of left 04/18/2023 No Yes lower extremity I10 Essential (primary) hypertension 04/18/2023 No Yes I73.89 Other specified peripheral vascular diseases 04/18/2023 No Yes N18.30 Chronic kidney disease, stage 3 unspecified 04/18/2023 No Yes Inactive Problems Resolved Problems Wirt, Lambert [name] ([id number]) [phone].pdf Page 5 of 8 Electronic Signature(s) Signed: 07/26/2023 8:36:32 AM By: Ryan Derry PA-C Entered By: Ryan [name] on 07/26/2023 08:36:32 -------------------------------------------------------------------------------- Progress Note Details Patient Name: Date of Service: Ryan [name]. 07/26/2023 8:00 [name] Medical Record Number: [id number] Patient Account Number: [id number] Date of Birth/Sex: Treating RN: 1971/04/29 (52 y.o. Ryan [name] Primary Care Provider: Larae [name] Other Clinician: Referring Provider: Treating Provider/Extender: Ryan [name] in Treatment: 14 Subjective Chief Complaint Information obtained from Patient Left leg and left foot ulcers History of Present Illness (HPI) Chronic/Inactive Condition: 04-18-2023 patient screening ABI today was 0.83 and this was on the left. With that being said he had previous ABIs done formally on 06-25-2021 with [name] right ABI of 1.12 and [name] TBI of 1.03 on the left she had an ABI of 1.12 with [name] TBI of 0.98. 04-18-2023 upon evaluation today patient presents for initial inspection here in our clinic some concerning issues he is having with his left proximal foot as  well as his left lower extremity. This actually appears to be more of [name] situation to be honest. He does have chronic lower extremity swelling secondary to what appears to be venous stasis he has not really had any wounds significantly before and tells me that it has been [name] probably even 12 or so since he has last used his  lymphedema pumps. He is not even sure if they are actually functioning anymore or not. With that being said this is 1 thing that I did want him to look into. He is on [name] fluid pill and he does not wear compression socks on [name] regular basis although I definitely think that something that he also really needs to be doing to be honest. He does not have any injury that occurred to cause these wounds they more or less spontaneously occurred as [name] result of what appears to be excessive swelling. Patient does have [name] history of diabetes mellitus type 2, congestive heart failure, lymphedema, venous hypertension, hypertension, peripheral vascular disease, and chronic kidney disease stage III. 8/26; this is [name] patient with chronic lymphedema. He has wounds on the left medial lower extremity and left medial foot. We have been using Hydrofera Blue and Urgo K2. He tells me he has [name] compression pump at home although it is missing apart and he is not really familiar with with which company to call. He also started on furosemide 20 mg every other day probably would do better with every day and he is talking to his physician about this. 05-05-2023 upon evaluation today patient appears to be doing pretty well currently in regard to his wound. Fortunately there does not appear to be any signs of infection he does have some slough and biofilm noted I did discuss with the patient today I do believe that he would benefit from [name] continuation of therapy with regard to his wound. I think we may need to increase the compression however and also think that he may benefit from targeted debridement today. 05-09-2023 upon evaluation today patient appears to be doing well currently in regard to his wound on the leg. He and both locations seems to be showing signs of improvement which is great news and in general I do believe there were making excellent headway towards complete closure which is great news. I do not see any signs of active  infection at this time which is also excellent news. 05-16-2023 upon evaluation today patient appears to be doing well currently with regard to his wound we did actually have [name] check at this today but that is not the biggest thing I am concerned about at this point. He unfortunately is having some issues here with oxygen saturation dropping to around 84% times and then bouncing back up and down he is breathing rapidly he is also fluctuating with regard to pulse anywhere from 88 that I got manually to it at some points even up at 110 his body does seem to be compensating. He tells me that he sleeps with oxygen but does not wear it during the day typically he has been short of breath for [name] little under [name] week. Shortly after he was here last week. With that being said the patient tells me that he does have [name] history of pulmonary embolisms. He also tells me that he has been having some issues catching his breath with ambulation as well which is also [name] big part of the issue at this point. Fortunately  I do not see any signs of obvious infection and is listening to him I could not tell any abnormal breath sounds although he may be [name] little bit restricted although I cannot be for sure. He tells me that the only thing he may have had [name] little bit of chills but nothing else significant. Nonetheless I think he needs to be evaluated at the ER ASAP. 05-26-2023 upon evaluation today patient appears to be doing well currently in regard to his wounds which are actually significantly better. He did have [name] hospital course where he just got out yesterday he ended up having pulmonary emboli at multiple in fact I think I read 14 that were present and had to be removed. He is now on additional blood thinners and states that he is in [name] good place as far as the blood thinners are concerned. He is feeling much better as well able to breathe quite nicely. I am very pleased with where things stand currently and do not see any signs  of infection at this time which is also good news. 05-31-2023 upon evaluation today patient appears to be doing well with regard to his wound one of them is healed the other is getting close. Unfortunately he is having issues again with his breathing today oxygen saturation around 87 to 90% in general during the evaluation at this point. This has me concerned again as he had previous pulmonary emboli does have an appointment with pulmonology tomorrow but I do not think he needs to wait till tomorrow. 06-07-2023 upon evaluation today patient appears to be doing well currently in regard to his wound. He is tolerating the dressing changes this looks to be doing Ryan [name], Ryan [name] ([id number]) [id number] Page 6 of 8 better in general I think removing in the right direction. I do not see any signs of active infection at this time. No fevers, chills, nausea, vomiting, or diarrhea. 06-28-2023 upon evaluation today patient appears to be doing [name] little worse in regard to his wounds due to the fact that unfortunately this has broken down after he was in the hospital most recently. Again this is the biggest concern that I have at this point. I do believe that he is going to have to go back into compression to get these under control and that he is gena need ongoing compression stockings. 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 His oxygen saturation remains low even today in the clinic with just sitting and not even talking he was bouncing between 88 and 90 I am not so sure he should not have oxygen 24/7 at least at low levels. 07-05-2023 upon evaluation today patient appears to be doing well currently in regard to his wound. In fact the left leg is doing significantly better in fact appears to be healed. The right leg is improving but at the same time is not completely healed as of yet. I fortunately do not see any signs of active infection locally or systemically at this time. No fevers,  chills, nausea, vomiting, or diarrhea. 07-12-2023 upon evaluation today patient unfortunately comes in today his wrap side slid down quite significantly on his legs. Fortunately I do not see any signs of infection but at the same time the wound on the right leg is still open at this point we are working on ordering the juxta fit compression wraps. 07-19-2023 upon evaluation today patient appears to be doing well currently in regard to his wounds. He has been tolerating the dressing changes  without complication. Wound does seem to be doing much better. It is going require some sharp debridement. 07-26-2023 upon evaluation today patient appears to be doing decently well in regard to his wounds at this point. Fortunately I do not see any signs of active infection which is good news and overall I am very pleased with where we stand today. Objective Constitutional Obese and well-hydrated in no acute distress. Vitals Time Taken: 8:07 AM, Height: 67 in, Weight: 338 lbs, BMI: 52.9, Temperature: 97.8 F, Pulse: 84 bpm, Respiratory Rate: 18 breaths/min, Blood Pressure: 152/99 mmHg. Respiratory normal breathing without difficulty. Psychiatric this patient is able to make decisions and demonstrates good insight into disease process. Alert and Oriented x 3. pleasant and cooperative. General Notes: Upon inspection patient's wound bed actually showed signs of good granulation epithelization he is actually doing quite well the biggest thing is he just needs to get his compression stockings as we need to start transitioning him into using the compression stockings especially on this left leg. Integumentary (Hair, Skin) Wound #3 status is Open. Original cause of wound was Gradually Appeared. The date acquired was: 06/21/2023. The wound has been in treatment 4 weeks. The wound is located on the Right,Anterior Lower Leg. The wound measures 0.4cm length x 1.2cm width x 0.1cm depth; 0.377cm^2 area and  0.038cm^3 volume. There is Fat Layer (Subcutaneous Tissue) exposed. There is no tunneling or undermining noted. There is [name] medium amount of serosanguineous drainage noted. There is medium (34-66%) red granulation within the wound bed. There is [name] medium (34-66%) amount of necrotic tissue within the wound bed including Adherent Slough. 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 There was [name] Double Layer Compression Therapy Procedure by Ryan Pax, RN. Post procedure Diagnosis Wound #3: Same as Pre-Procedure There was [name] Double Layer Compression Therapy Procedure by Ryan Pax, RN. Ryan [name] ([id number]) [id number] Page 7 of 8 Post procedure Diagnosis Wound #: Same as Pre-Procedure Plan Follow-up Appointments: Return Appointment in 1 week. Bathing/ Shower/ Hygiene: May shower with wound dressing protected with water repellent cover or cast protector. Anesthetic (Use 'Patient Medications' Section for Anesthetic Order Entry): Lidocaine applied to wound bed Edema Control - Orders / Instructions: UrgoK2 - left lower leg Elevate, Exercise Daily and Avoid Standing for Long Periods of Time. Elevate legs to the level of the heart and pump ankles as  often as possible Elevate leg(s) parallel to the floor when sitting. WOUND #3: - Lower Leg Wound Laterality: Right, Anterior Cleanser: Soap and Water 1 x Per Week/30 Days Discharge Instructions: Gently cleanse wound with antibacterial soap, rinse and pat dry prior to dressing wounds Cleanser: Wound Cleanser 1 x Per Week/30 Days Discharge Instructions: Wash your hands with soap and water. Remove old dressing, discard into plastic bag and place into trash. Cleanse the wound with Wound Cleanser prior to applying [name] clean dressing using gauze sponges, not tissues or cotton  balls. Do not scrub or use excessive force. Pat dry using gauze sponges, not tissue or cotton balls. Prim Dressing: Hydrofera Blue Ready Transfer Foam, 2.5x2.5 (in/in) 1 x Per Week/30 Days ary Discharge Instructions: Apply Hydrofera Blue Ready to wound bed as directed Secondary Dressing: ABD Pad 5x9 (in/in) 1 x Per Week/30 Days Discharge Instructions: Cover with ABD pad Com pression Wrap: Urgo K2, two layer compression system, large 1 x Per Week/30 Days 1. I would recommend based on what we are seeing that we have the patient going to continue to monitor for any signs of infection or worsening. He does need to see about getting some compression stockings so he has something long-term for relief. 2. I am also going to recommend that the patient should continue to elevate his legs much as possible he also needs to be watching his weight with the congestive heart failure he is should be monitoring and checking his weight on [name] daily basis. 3. I am also can recommend that he should continue to keep [name] track of his breathing and if anything changes he knows to get checked out as quickly as possible in that regard. We will see patient back for reevaluation in 1 week here in the clinic. If anything worsens or changes patient will contact our office for additional recommendations. Electronic Signature(s) Signed: 07/26/2023 5:18:39 PM By:  Ryan Derry PA-C Entered By: Ryan [name] on 07/26/2023 17:18:39 -------------------------------------------------------------------------------- SuperBill Details Patient Name: Date of Service: Ryan [name]. 07/26/2023 Medical Record Number: [id number] Patient Account Number: [id number] Date of Birth/Sex: Treating RN: Nov 18, 1970 (52 y.o. 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 Modifier: Quantity: 1 Physician Procedures : CPT4 Code Description Modifier [id number] - WC PHYS LEVEL 3 - EST PT ICD-10 Diagnosis Description E11.622 Type 2 diabetes mellitus with other skin ulcer L97.822 Non-pressure chronic ulcer of other part of left lower leg with fat layer exposed  E11.621 Type 2 diabetes mellitus with foot ulcer L97.522 Non-pressure chronic ulcer of other part of left foot with fat layer exposed Quantity:  1 Electronic Signature(s) Signed: 07/26/2023 5:19:08 PM By: Ryan Derry PA-C Previous Signature: 07/26/2023 1:12:23 PM Version By: Ryan Pax RN Entered By: Ryan [name] on 07/26/2023 17:19:08

## 2023-07-27 NOTE — Patient Instructions (Signed)
continue 1 tablet Daily.  INR in 5 weeks. ? ? [phone] ?

## 2023-08-02 ENCOUNTER — Ambulatory Visit: Payer: No Typology Code available for payment source | Admitting: Physician Assistant

## 2023-08-02 ENCOUNTER — Inpatient Hospital Stay: Payer: No Typology Code available for payment source | Attending: Nurse Practitioner

## 2023-08-03 ENCOUNTER — Other Ambulatory Visit: Payer: No Typology Code available for payment source

## 2023-08-03 ENCOUNTER — Inpatient Hospital Stay: Payer: No Typology Code available for payment source | Admitting: Oncology

## 2023-08-03 NOTE — Assessment & Plan Note (Signed)
Continue Coumadin. 

## 2023-08-03 NOTE — Assessment & Plan Note (Signed)
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 Failed DOACs recently with recurrent PE status post mechanical thrombectomy It is possible that DOACs are not effective enough in patient with morbid obesity. Previously hypercoagulable work-up was negative His risk factors including morbid obesity, sedentary lifestyle etc. I agree with continue Coumadin, dosing with INR goal between 2-3. Appreciate primary care provider to continue managing his Coumadin.  It appears that INR has been very stable at goal.

## 2023-08-08 ENCOUNTER — Telehealth: Payer: Self-pay | Admitting: Nurse Practitioner

## 2023-08-08 NOTE — Telephone Encounter (Unsigned)
Copied from CRM [id number]. Topic: General - Other >> Aug 08, 2023  3:40 PM Phill Myron wrote: Please call regarding FMLA time.  Patient has questions regarding time?

## 2023-08-09 ENCOUNTER — Encounter: Payer: No Typology Code available for payment source | Attending: Physician Assistant | Admitting: Physician Assistant

## 2023-08-09 DIAGNOSIS — L97522 Non-pressure chronic ulcer of other part of left foot with fat layer exposed: Secondary | ICD-10-CM | POA: Diagnosis not present

## 2023-08-09 DIAGNOSIS — I890 Lymphedema, not elsewhere classified: Secondary | ICD-10-CM | POA: Insufficient documentation

## 2023-08-09 DIAGNOSIS — E1151 Type 2 diabetes mellitus with diabetic peripheral angiopathy without gangrene: Secondary | ICD-10-CM | POA: Diagnosis not present

## 2023-08-09 DIAGNOSIS — E1122 Type 2 diabetes mellitus with diabetic chronic kidney disease: Secondary | ICD-10-CM | POA: Insufficient documentation

## 2023-08-09 DIAGNOSIS — E11621 Type 2 diabetes mellitus with foot ulcer: Secondary | ICD-10-CM | POA: Insufficient documentation

## 2023-08-09 DIAGNOSIS — I5042 Chronic combined systolic (congestive) and diastolic (congestive) heart failure: Secondary | ICD-10-CM | POA: Diagnosis not present

## 2023-08-09 DIAGNOSIS — E11622 Type 2 diabetes mellitus with other skin ulcer: Secondary | ICD-10-CM | POA: Diagnosis present

## 2023-08-09 DIAGNOSIS — L97822 Non-pressure chronic ulcer of other part of left lower leg with fat layer exposed: Secondary | ICD-10-CM | POA: Insufficient documentation

## 2023-08-09 DIAGNOSIS — I130 Hypertensive heart and chronic kidney disease with heart failure and stage 1 through stage 4 chronic kidney disease, or unspecified chronic kidney disease: Secondary | ICD-10-CM | POA: Insufficient documentation

## 2023-08-09 DIAGNOSIS — I87332 Chronic venous hypertension (idiopathic) with ulcer and inflammation of left lower extremity: Secondary | ICD-10-CM | POA: Insufficient documentation

## 2023-08-09 DIAGNOSIS — N183 Chronic kidney disease, stage 3 unspecified: Secondary | ICD-10-CM | POA: Diagnosis not present

## 2023-08-09 NOTE — Telephone Encounter (Signed)
Yes that is fine

## 2023-08-09 NOTE — Progress Notes (Signed)
DEMETRIOS, AMARANTE [name] ([id number]) [id number] Page 1 of 8 Visit Report for 08/09/2023 Arrival Information Details Patient Name: Date of Service: Ryan [name], Ryan [name]. 08/09/2023 8:00 [name] Medical Record Number: [id number] Patient Account Number: [id number] Date of Birth/Sex: Treating RN: 08-10-1971 (52 y.o. [name]) Yevonne Pax Primary Care Ragena Fiola: [name] Other Clinician: Referring Jakwon Gayton: Treating Terelle Dobler/Extender: [name] in Treatment: 16 Visit Information History Since Last Visit Added or deleted any medications: No Patient Arrived: Ambulatory Any new allergies or adverse reactions: No Arrival Time: 08:06 Had [name] fall or experienced change in No Accompanied By: self activities of daily living that may affect Transfer Assistance: None risk of falls: Patient Identification Verified: Yes Signs or symptoms of abuse/neglect since last visito No Secondary Verification Process Completed: Yes Hospitalized since last visit: No Patient Requires Transmission-Based Precautions: No Implantable device outside of the clinic excluding No Patient Has Alerts: Yes cellular tissue based products placed in the center Patient Alerts: Patient on Blood Thinner since last visit: diabetic Has Dressing in Place as Prescribed: Yes Has Compression in Place as Prescribed: Yes Pain Present Now: No Electronic Signature(s) Signed: 08/09/2023 3:25:36 PM By: Yevonne Pax RN Entered By: Yevonne Pax on 08/09/2023 05:07:31 -------------------------------------------------------------------------------- Clinic Level of Care Assessment Details Patient Name: Date of Service: Ryan [name], Ryan [name]. 08/09/2023 8:00 [name] Medical Record Number: [id number] Patient Account Number: [id number] Date of Birth/Sex: Treating RN: 11-10-1970 (52 y.o. [name] Primary Care Briseis Aguilera: [name] Other Clinician: Referring Kiev Labrosse: Treating Evaline Waltman/Extender: [name] in Treatment: 16 Clinic Level of Care Assessment Items TOOL 4 Quantity Score X- 1 0 Use when only an EandM is performed on FOLLOW-UP visit ASSESSMENTS - Nursing Assessment / Reassessment X- 1 10 Reassessment of Co-morbidities (includes updates in patient status) X- 1 5 Reassessment of Adherence to Treatment Plan Ryan [name], Ryan [name] [name] ([id number]) [id number] Page 2 of 8 ASSESSMENTS - Wound and Skin [name] ssessment / Reassessment X - Simple Wound Assessment / Reassessment - one wound 1 5 []  - 0 Complex Wound Assessment / Reassessment - multiple wounds []  - 0 Dermatologic / Skin Assessment (not related to wound area) ASSESSMENTS - Focused Assessment []  - 0 Circumferential Edema Measurements - multi extremities []  - 0 Nutritional Assessment / Counseling / Intervention []  - 0 Lower Extremity Assessment (monofilament, tuning fork, pulses) []  - 0 Peripheral Arterial Disease Assessment (using hand held doppler) ASSESSMENTS - Ostomy and/or Continence Assessment and Care []  - 0 Incontinence Assessment and Management []  - 0 Ostomy Care Assessment and Management (repouching, etc.) PROCESS - Coordination of Care X - Simple Patient / Family Education for ongoing care 1 15 []  - 0 Complex (extensive) Patient / Family Education for ongoing care []  - 0 Staff obtains Chiropractor, Records, T Results / Process Orders est []  - 0 Staff telephones HHA, Nursing Homes / Clarify orders / etc []  - 0 Routine Transfer to another Facility (non-emergent condition) []  - 0 Routine Hospital Admission (non-emergent condition) []  - 0 New Admissions / Manufacturing engineer / Ordering NPWT Apligraf, etc. , []  - 0 Emergency Hospital Admission (emergent condition) X- 1 10 Simple Discharge Coordination []  - 0 Complex (extensive) Discharge Coordination PROCESS - Special Needs []  - 0 Pediatric / Minor Patient Management []  - 0 Isolation Patient  Management []  - 0 Hearing / Language / Visual special needs []  - 0 Assessment of Community assistance (transportation, D/C planning, etc.) []  - 0 Additional assistance / Altered mentation []  - 0 Support Surface(s) Assessment (  bed, cushion, seat, etc.) INTERVENTIONS - Wound Cleansing / Measurement []  - 0 Simple Wound Cleansing - one wound []  - 0 Complex Wound Cleansing - multiple wounds []  - 0 Wound Imaging (photographs - any number of wounds) []  - 0 Wound Tracing (instead of photographs) []  - 0 Simple Wound Measurement - one wound []  - 0 Complex Wound Measurement - multiple wounds INTERVENTIONS - Wound Dressings []  - 0 Small Wound Dressing one or multiple wounds []  - 0 Medium Wound Dressing one or multiple wounds []  - 0 Large Wound Dressing one or multiple wounds []  - 0 Application of Medications - topical []  - 0 Application of Medications - injection INTERVENTIONS - Miscellaneous []  - 0 External ear exam Ryan [name], Ryan [name] ([id number]) [id number] Page 3 of 8 []  - 0 Specimen Collection (cultures, biopsies, blood, body fluids, etc.) []  - 0 Specimen(s) / Culture(s) sent or taken to Lab for analysis []  - 0 Patient Transfer (multiple staff / Michiel Sites Lift / Similar devices) []  - 0 Simple Staple / Suture removal (25 or less) []  - 0 Complex Staple / Suture removal (26 or more) []  - 0 Hypo / Hyperglycemic Management (close monitor of Blood Glucose) []  - 0 Ankle / Brachial Index (ABI) - do not check if billed separately X- 1 5 Vital Signs Has the patient been seen at the hospital within the last three years: Yes Total Score: 50 Level Of Care: New/Established - Level 2 Electronic Signature(s) Signed: 08/09/2023 3:25:36 PM By: Yevonne Pax RN Entered By: Yevonne Pax on 08/09/2023 06:25:18 -------------------------------------------------------------------------------- Encounter Discharge Information Details Patient Name: Date of Service: Ryan [name]. 08/09/2023 8:00 [name] Medical Record Number: [id number] Patient Account Number: [id number] Date of Birth/Sex: Treating RN: October 08, 1970 (52 y.o. [name] Primary Care Athira Janowicz: [name] Other Clinician: Referring Edric Fetterman: Treating Earlyne Feeser/Extender: [name] in Treatment: 16 Encounter Discharge Information Items Discharge Condition: Stable Ambulatory Status: Ambulatory Discharge Destination: Home Transportation: Private Auto Accompanied By: self Schedule Follow-up Appointment: Yes Clinical Summary of Care: Electronic Signature(s) Signed: 08/09/2023 9:28:31 AM By: Yevonne Pax RN Entered By: Yevonne Pax on 08/09/2023 06:28:31 Lower Extremity Assessment Details -------------------------------------------------------------------------------- [name] ([id number]) [id number] Page 4 of 8 Patient Name: Date of Service: Ryan [name], Ryan [name]. 08/09/2023 8:00 [name] Medical Record Number: [id number] Patient Account Number: [id number] Date of Birth/Sex: Treating RN: 1971/08/16 (52 y.o. [name]) Yevonne Pax Primary Care Angelyn Osterberg: [name] Other Clinician: Referring Ike Maragh: Treating Christian Borgerding/Extender: [name] in Treatment: 16 Edema Assessment Left: Right: Assessed: No No Edema: Calf Left: Right: Point of Measurement: 40 cm From Medial Instep 50 cm 52 cm Ankle Left: Right: Point of Measurement: 10 cm From Medial Instep 29 cm 29 cm Knee To Floor Left: Right: From Medial Instep 44 cm 44 cm Vascular Assessment Left: Right: Pulses: Dorsalis Pedis Palpable: Yes Yes Extremity colors, hair growth, and conditions: Extremity Color: Hyperpigmented Hyperpigmented Hair Growth on Extremity: No No Temperature of Extremity: Warm Warm Capillary Refill: < 3 seconds < 3 seconds Dependent Rubor: No No Blanched when Elevated: No No Lipodermatosclerosis: Yes Yes Toe Nail  Assessment Left: Right: Thick: Yes Yes Discolored: Yes Yes Deformed: Yes Yes Improper Length and Hygiene: Yes Yes Electronic Signature(s) Signed: 08/09/2023 3:25:36 PM By: Yevonne Pax RN Entered By: Yevonne Pax on 08/09/2023 05:18:27 -------------------------------------------------------------------------------- Multi Wound Chart Details Patient Name: Date of Service: Ryan [name]. 08/09/2023 8:00 [name] Medical Record Number: [id number] Patient Account Number: [id number] Date of Birth/Sex: Treating  RN: Nov 11, 1970 (52 y.o. [name] Primary Care Larence Thone: [name] Other Clinician: Referring Dedric Ethington: Treating Odalys Win/Extender: [name] in Treatment: [address] [place], [place] ([id number]) [id number] Page 5 of 8 Height(in): 67 Pulse(bpm): 88 Weight(lbs): 338 Blood Pressure(mmHg): 159/90 Body Mass Index(BMI): 52.9 Temperature(F): 97.7 Respiratory Rate(breaths/min): 20 [3:Photos: No Photos Right, Anterior Lower Leg Wound Location: Gradually Appeared Wounding Event: Diabetic Wound/Ulcer of the Lower Primary Etiology: Extremity Arrhythmia, Congestive Heart Failure, Comorbid History: Hypertension, Peripheral Venous Disease, Type II  Diabetes 06/21/2023 Date Acquired: 6 Weeks of Treatment: Open Wound Status: No Wound Recurrence: 0x0x0 Measurements L x W x D (cm) 0 [name] (cm) : rea 0 Volume (cm) : 100.00% % Reduction in [name] rea: 100.00% % Reduction in Volume: Grade 1 Classification: None  Present Exudate [name] mount: None Present (0%) Granulation [name] mount: None Present (0%) Necrotic [name] mount: Fascia: No Exposed Structures: Fat Layer (Subcutaneous Tissue): No Tendon: No Muscle: No Joint: No Bone: No None Epithelialization:] [N/[name]:N/[name] N/[name] N/[name] N/[name]  N/[name] N/[name] N/[name] N/[name] N/[name] N/[name] N/[name] N/[name] N/[name] N/[name] N/[name] N/[name] N/[name] N/[name] N/[name] N/[name]] Treatment Notes Electronic Signature(s) Signed: 08/09/2023 9:23:54 AM By: Yevonne Pax RN Entered By: Yevonne Pax on  08/09/2023 06:23:54 -------------------------------------------------------------------------------- Multi-Disciplinary Care Plan Details Patient Name: Date of Service: Ryan [name]. 08/09/2023 8:00 [name] Medical Record Number: [id number] Patient Account Number: [id number] Date of Birth/Sex: Treating RN: [birth year] (52 y.o. [name] Primary Care Arilyn Brierley: [name] Other Clinician: Referring Eugenie Harewood: Treating Leatrice Parilla/Extender: [name] in Treatment: 16 Active Inactive Electronic Signature(s) Signed: 08/09/2023 9:26:11 AM By: Yevonne Pax RN Entered By: Yevonne Pax on 08/09/2023 06:26:11 Ryan [name], Ryan [name] ([id number]) [id number] Page 6 of 8 -------------------------------------------------------------------------------- Pain Assessment Details Patient Name: Date of Service: Ryan [name], Ryan [name]. 08/09/2023 8:00 [name] Medical Record Number: [id number] Patient Account Number: [id number] Date of Birth/Sex: Treating RN: 03-07-1971 (52 y.o. [name]) Yevonne Pax Primary Care Jayvan Mcshan: [name] Other Clinician: Referring Bion Todorov: Treating Yariana Hoaglund/Extender: [name] in Treatment: 16 Active Problems Location of Pain Severity and Description of Pain Patient Has Paino No Site Locations Pain Management and Medication Current Pain Management: Electronic Signature(s) Signed: 08/09/2023 3:25:36 PM By: Yevonne Pax RN Entered By: Yevonne Pax on 08/09/2023 05:07:53 -------------------------------------------------------------------------------- Patient/Caregiver Education Details Patient Name: Date of Service: Ryan [name] 12/10/2024andnbsp8:00 [name] Medical Record Number: [id number] Patient Account Number: [id number] Date of Birth/Gender: Treating RN: 1970/12/27 (52 y.o. [name] Primary Care Physician: [name] Other Clinician: Referring Physician: Treating Physician/Extender:  [name] in Treatment: [address], [place] [name] ([id number]) [id number] Page 7 of 8 Education Assessment Education Provided To: Patient Education Topics Provided Wound/Skin Impairment: Handouts: Other: discharge instructions Methods: Explain/Verbal Responses: State content correctly Electronic Signature(s) Signed: 08/09/2023 3:25:36 PM By: Yevonne Pax RN Entered By: Yevonne Pax on 08/09/2023 06:26:48 -------------------------------------------------------------------------------- Wound Assessment Details Patient Name: Date of Service: Ryan [name]. 08/09/2023 8:00 [name] Medical Record Number: [id number] Patient Account Number: [id number] Date of Birth/Sex: Treating RN: April 16, 1971 (52 y.o. [name] Primary Care Kalid Ghan: [name] Other Clinician: Referring Kalem Rockwell: Treating Karstyn Birkey/Extender: [name] in Treatment: 16 Wound Status Wound Number: 3 Primary Diabetic Wound/Ulcer of the Lower Extremity Etiology: Wound Location: Right, Anterior Lower Leg Wound Open Wounding Event: Gradually Appeared Status: Date Acquired: 06/21/2023 Comorbid Arrhythmia, Congestive Heart Failure, Hypertension, Peripheral Weeks Of Treatment: 6 History: Venous Disease, Type II Diabetes Clustered Wound: No Wound Measurements Length: (  cm) Width: (cm) Depth: (cm) Area: (cm) Volume: (cm) 0 % Reduction in Area: 100% 0 % Reduction in Volume: 100% 0 Epithelialization: None 0 Tunneling: No 0 Undermining: No Wound Description Classification: Grade 1 Exudate Amount: None Present Foul Odor After Cleansing: No Slough/Fibrino No Wound Bed Granulation Amount: None Present (0%) Exposed Structure Necrotic Amount: None Present (0%) Fascia Exposed: No Fat Layer (Subcutaneous Tissue) Exposed: No Tendon Exposed: No Muscle Exposed: No Joint Exposed: No Bone Exposed: No Electronic Signature(s) Signed:  08/09/2023 3:25:36 PM By: Yevonne Pax RN Entered By: Yevonne Pax on 08/09/2023 05:16:23 Ryan [name], Ryan [name] ([id number]) [id number] Page 8 of 8 -------------------------------------------------------------------------------- Vitals Details Patient Name: Date of Service: Ryan [name], Ryan [name]. 08/09/2023 8:00 [name] Medical Record Number: [id number] Patient Account Number: [id number] Date of Birth/Sex: Treating RN: [birth year] (52 y.o. [name]) Yevonne Pax Primary Care Marjani Kobel: [name] Other Clinician: Referring Deisi Salonga: Treating Maston Wight/Extender: [name] in Treatment: 16 Vital Signs Time Taken: 08:07 Temperature (F): 97.7 Height (in): 67 Pulse (bpm): 88 Weight (lbs): 338 Respiratory Rate (breaths/min): 20 Body Mass Index (BMI): 52.9 Blood Pressure (mmHg): 159/90 Reference Range: 80 - 120 mg / dl Electronic Signature(s) Signed: 08/09/2023 3:25:36 PM By: Yevonne Pax RN Entered By: Yevonne Pax on 08/09/2023 05:07:47

## 2023-08-09 NOTE — Telephone Encounter (Signed)
Called and spoke to patient. He states that we did not complete his FMLA right. He states that we needed to fill in the paperwork for questions 7 and 8, the follow up appointments and the episodic flare up questions. I explained to the patient that based on his mychart message, we were under the impression that he need this paperwork for the continuous period when he was in the hospital. Patient states that was not correct as he gets STD for his continuous hospital stays. Can we fill in the questions for episodic flare up and to allow for appointments?

## 2023-08-09 NOTE — Progress Notes (Signed)
TONIA, CLOWE [name] ([id number]) [id number] Page 1 of 7 Visit Report for 08/09/2023 Chief Complaint Document Details Patient Name: Date of Service: Ryan [name], Ryan [name]. 08/09/2023 8:00 [name] Medical Record Number: [id number] Patient Account Number: [id number] Date of Birth/Sex: Treating RN: 07-04-1971 (52 y.o. [name]) Yevonne Pax Primary Care Provider: [name] Other Clinician: Referring Provider: Treating Provider/Extender: [name] in Treatment: 16 Information Obtained from: Patient Chief Complaint Left leg and left foot ulcers Electronic Signature(s) Signed: 08/09/2023 8:26:37 AM By: [name] PA-C Entered By: [name] on 08/09/2023 08:26:37 -------------------------------------------------------------------------------- HPI Details Patient Name: Date of Service: Ryan [name]. 08/09/2023 8:00 [name] Medical Record Number: [id number] Patient Account Number: [id number] Date of Birth/Sex: Treating RN: 1971-05-19 (52 y.o. [name] Primary Care Provider: [name] Other Clinician: Referring Provider: Treating Provider/Extender: [name] in Treatment: 16 History of Present Illness Chronic/Inactive Conditions Condition 1: 04-18-2023 patient screening ABI today was 0.83 and this was on the left. With that being said he had previous ABIs done formally on 06-25-2021 with [name] right ABI of 1.12 and [name] TBI of 1.03 on the left she had an ABI of 1.12 with [name] TBI of 0.98. HPI Description: 04-18-2023 upon evaluation today patient presents for initial inspection here in our clinic some concerning issues he is having with his left proximal foot as well as his left lower extremity. This actually appears to be more of [name] situation to be honest. He does have chronic lower extremity swelling secondary to what appears to be venous stasis he has not really had any wounds significantly before and tells  me that it has been [name] probably even 12 or so since he has last used his lymphedema pumps. He is not even sure if they are actually functioning anymore or not. With that being said this is 1 thing that I did want him to look into. He is on [name] fluid pill and he does not wear compression socks on [name] regular basis although I definitely think that something that he also really needs to be doing to be honest. He does not have any injury that occurred to cause these wounds they more or less spontaneously occurred as [name] result of what appears to be excessive swelling. Patient does have [name] history of diabetes mellitus type 2, congestive heart failure, lymphedema, venous hypertension, hypertension, peripheral vascular disease, and chronic kidney disease stage III. 8/26; this is [name] patient with chronic lymphedema. He has wounds on the left medial lower extremity and left medial foot. We have been using Hydrofera Blue and Urgo K2. He tells me he has [name] compression pump at home although it is missing apart and he is not really familiar with with which company to call. He also started on furosemide 20 mg every other day probably would do better with every day and he is talking to his physician about this. KARLTON, BATSON [name] ([id number]) [id number] Page 2 of 7 05-05-2023 upon evaluation today patient appears to be doing pretty well currently in regard to his wound. Fortunately there does not appear to be any signs of infection he does have some slough and biofilm noted I did discuss with the patient today I do believe that he would benefit from [name] continuation of therapy with regard to his wound. I think we may need to increase the compression however and also think that he may benefit from targeted debridement today. 05-09-2023 upon evaluation today patient  appears to be doing well currently in regard to his wound on the leg. He and both locations seems to be showing signs of  improvement which is great news and in general I do believe there were making excellent headway towards complete closure which is great news. I do not see any signs of active infection at this time which is also excellent news. 05-16-2023 upon evaluation today patient appears to be doing well currently with regard to his wound we did actually have [name] check at this today but that is not the biggest thing I am concerned about at this point. He unfortunately is having some issues here with oxygen saturation dropping to around 84% times and then bouncing back up and down he is breathing rapidly he is also fluctuating with regard to pulse anywhere from 88 that I got manually to it at some points even up at 110 his body does seem to be compensating. He tells me that he sleeps with oxygen but does not wear it during the day typically he has been short of breath for [name] little under [name] week. Shortly after he was here last week. With that being said the patient tells me that he does have [name] history of pulmonary embolisms. He also tells me that he has been having some issues catching his breath with ambulation as well which is also [name] big part of the issue at this point. Fortunately I do not see any signs of obvious infection and is listening to him I could not tell any abnormal breath sounds although he may be [name] little bit restricted although I cannot be for sure. He tells me that the only thing he may have had [name] little bit of chills but nothing else significant. Nonetheless I think he needs to be evaluated at the ER ASAP. 05-26-2023 upon evaluation today patient appears to be doing well currently in regard to his wounds which are actually significantly better. He did have [name] hospital course where he just got out yesterday he ended up having pulmonary emboli at multiple in fact I think I read 14 that were present and had to be removed. He is now on additional blood thinners and states that he is in [name] good place as far as  the blood thinners are concerned. He is feeling much better as well able to breathe quite nicely. I am very pleased with where things stand currently and do not see any signs of infection at this time which is also good news. 05-31-2023 upon evaluation today patient appears to be doing well with regard to his wound one of them is healed the other is getting close. Unfortunately he is having issues again with his breathing today oxygen saturation around 87 to 90% in general during the evaluation at this point. This has me concerned again as he had previous pulmonary emboli does have an appointment with pulmonology tomorrow but I do not think he needs to wait till tomorrow. 06-07-2023 upon evaluation today patient appears to be doing well currently in regard to his wound. He is tolerating the dressing changes this looks to be doing better in general I think removing in the right direction. I do not see any signs of active infection at this time. No fevers, chills, nausea, vomiting, or diarrhea. 06-28-2023 upon evaluation today patient appears to be doing [name] little worse in regard to his wounds due to the fact that unfortunately this has broken down after he was in the hospital most recently.  Again this is the biggest concern that I have at this point. I do believe that he is going to have to go back into compression to get these under control and that he is gena need ongoing compression stockings. 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 His oxygen saturation remains low even today in the clinic with just sitting and not even talking he was bouncing between 88 and 90 I am not so sure he should not have oxygen 24/7 at least at low levels. 07-05-2023 upon evaluation today patient appears to be doing well currently in regard to his wound. In fact the left leg is doing significantly better in fact appears to be healed. The right leg is improving but at the same time is not completely healed as of yet. I  fortunately do not see any signs of active infection locally or systemically at this time. No fevers, chills, nausea, vomiting, or diarrhea. 07-12-2023 upon evaluation today patient unfortunately comes in today his wrap side slid down quite significantly on his legs. Fortunately I do not see any signs of infection but at the same time the wound on the right leg is still open at this point we are working on ordering the juxta fit compression wraps. 07-19-2023 upon evaluation today patient appears to be doing well currently in regard to his wounds. He has been tolerating the dressing changes without complication. Wound does seem to be doing much better. It is going require some sharp debridement. 07-26-2023 upon evaluation today patient appears to be doing decently well in regard to his wounds at this point. Fortunately I do not see any signs of active infection which is good news and overall I am very pleased with where we stand today. 08-09-2023 upon evaluation today patient appears to be doing well currently in regard to his wounds in fact everything appears to be completely healed in regard to his legs. Fortunately I do not see any signs of active infection at this time. Electronic Signature(s) Signed: 08/09/2023 10:08:22 AM By: [name] PA-C Entered By: [name] on 08/09/2023 10:08:22 -------------------------------------------------------------------------------- Physical Exam Details Patient Name: Date of Service: Ryan [name]. 08/09/2023 8:00 [name] Medical Record Number: [id number] Patient Account Number: [id number] Date of Birth/Sex: Treating RN: May 07, 1971 (52 y.o. [name] Primary Care Provider: [name] Other Clinician: Referring Provider: Treating Provider/Extender: [name] in Treatment: 16 Constitutional Well-nourished and well-hydrated in no acute distress. Ryan [name], Ryan [name] ([id number]) [id number] Page  3 of 7 Respiratory normal breathing without difficulty. Psychiatric this patient is able to make decisions and demonstrates good insight into disease process. Alert and Oriented x 3. pleasant and cooperative. Notes Patient's wound bed showed signs of good granulation epithelization at this point. Fortunately I do not see any signs of worsening and I do believe that the patient is making headway here towards closure. Electronic Signature(s) Signed: 08/09/2023 10:08:33 AM By: [name] PA-C Entered By: [name] on 08/09/2023 10:08:33 -------------------------------------------------------------------------------- Physician Orders Details Patient Name: Date of Service: Ryan [name]. 08/09/2023 8:00 [name] Medical Record Number: [id number] Patient Account Number: [id number] Date of Birth/Sex: Treating RN: 1971-02-26 (52 y.o. [name] Primary Care Provider: [name] Other Clinician: Referring Provider: Treating Provider/Extender: [name] in Treatment: 16 The following information was scribed by: Yevonne Pax The information was scribed for: [name] Verbal / Phone Orders: No Diagnosis Coding ICD-10 Coding Code Description E11.622 Type 2 diabetes mellitus with other skin ulcer L97.822  Non-pressure chronic ulcer of other part of left lower leg with fat layer exposed E11.621 Type 2 diabetes mellitus with foot ulcer L97.522 Non-pressure chronic ulcer of other part of left foot with fat layer exposed I50.42 Chronic combined systolic (congestive) and diastolic (congestive) heart failure I89.0 Lymphedema, not elsewhere classified 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 I10 Essential (primary) hypertension I73.89 Other specified peripheral vascular diseases N18.30 Chronic kidney disease, stage 3 unspecified Discharge From [facility] Discharge from [facility] Treatment  Complete - compression pumps daily when they arrive Wear compression garments daily. Put garments on first thing when you wake up and remove them before bed. Moisturize legs daily after removing compression garments. Elevate, Exercise Daily and [name] void Standing for Long Periods of Time. DO YOUR BEST to sleep in the bed at night. DO NOT sleep in your recliner. Long hours of sitting in [name] recliner leads to swelling of the legs and/or potential wounds on your backside. Electronic Signature(s) Signed: 08/09/2023 9:24:50 AM By: Yevonne Pax RN Signed: 08/10/2023 7:54:12 PM By: [name] PA-C Entered By: Yevonne Pax on 08/09/2023 09:24:50 Jernigan, Bing Neighbors ([id number]) [id number] Page 4 of 7 -------------------------------------------------------------------------------- Problem List Details Patient Name: Date of Service: Ryan [name], Ryan [name]. 08/09/2023 8:00 [name] Medical Record Number: [id number] Patient Account Number: [id number] Date of Birth/Sex: Treating RN: 06-18-1971 (52 y.o. 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/19/2024 No Yes E11.621 Type 2 diabetes mellitus with foot ulcer 04/18/2023 No Yes L97.522 Non-pressure chronic ulcer of other part of left foot with fat layer exposed 04/18/2023 No Yes I50.42 Chronic combined systolic (congestive) and diastolic (congestive) heart failure 04/18/2023 No Yes I89.0 Lymphedema, not elsewhere classified 04/18/2023 No Yes I87.332 Chronic venous hypertension (idiopathic) with ulcer and inflammation of left 04/18/2023 No Yes lower extremity I10 Essential (primary)  hypertension 04/18/2023 No Yes I73.89 Other specified peripheral vascular diseases 04/18/2023 No Yes N18.30 Chronic kidney disease, stage 3 unspecified 04/18/2023 No Yes Inactive Problems Resolved Problems Electronic Signature(s) Signed: 08/09/2023 8:26:29 AM By: Wonda Olds, Bing Neighbors ([id number]) [id number] Page 5 of 7 Entered By: [name] on 08/09/2023 08:26:29 -------------------------------------------------------------------------------- Progress Note Details Patient Name: Date of Service: Ryan [name], Ryan [name]. 08/09/2023 8:00 [name] Medical Record Number: [id number] Patient Account Number: [id number] Date of Birth/Sex: Treating RN: [birth year] (52 y.o. [name]) Yevonne Pax Primary Care Provider: [name] Other Clinician: Referring Provider: Treating Provider/Extender: [name] in Treatment: 16 Subjective Chief Complaint Information obtained from Patient Left leg and left foot ulcers History of Present Illness (HPI) Chronic/Inactive Condition: 04-18-2023 patient screening ABI today was 0.83 and this was on the left. With that being said he had previous ABIs done formally on 06-25-2021 with [name] right ABI of 1.12 and [name] TBI of 1.03 on the left she had an ABI of 1.12 with [name] TBI of 0.98. 04-18-2023 upon evaluation today patient presents for initial inspection here in our clinic some concerning issues he is having with his left proximal foot as well as his left lower extremity. This actually appears to be more of [name] situation to be honest. He does have chronic lower extremity swelling secondary  to what appears to be venous stasis he has not really had any wounds significantly before and tells me that it has been [name] probably even 12 or so since he has last used his lymphedema pumps. He is not even sure if they are actually functioning anymore or not. With that being said this is 1 thing that I did want  him to look into. He is on [name] fluid pill and he does not wear compression socks on [name] regular basis although I definitely think that something that he also really needs to be doing to be honest. He does not have any injury that occurred to cause these wounds they more or less spontaneously occurred as [name] result of what appears to be excessive swelling. Patient does have [name] history of diabetes mellitus type 2, congestive heart failure, lymphedema, venous hypertension, hypertension, peripheral vascular disease, and chronic kidney disease stage III. 8/26; this is [name] patient with chronic lymphedema. He has wounds on the left medial lower extremity and left medial foot. We have been using Hydrofera Blue and Urgo K2. He tells me he has [name] compression pump at home although it is missing apart and he is not really familiar with with which company to call. He also started on furosemide 20 mg every other day probably would do better with every day and he is talking to his physician about this. 05-05-2023 upon evaluation today patient appears to be doing pretty well currently in regard to his wound. Fortunately there does not appear to be any signs of infection he does have some slough and biofilm noted I did discuss with the patient today I do believe that he would benefit from [name] continuation of therapy with regard to his wound. I think we may need to increase the compression however and also think that he may benefit from targeted debridement today. 05-09-2023 upon evaluation today patient appears to be doing well currently in regard to his wound on the leg. He and both locations seems to be showing signs of improvement which is great news and in general I do believe there were making excellent headway towards complete closure which is great news. I do not see any signs of active infection at this time which is also excellent news. 05-16-2023 upon evaluation today patient appears to be doing well currently with regard to  his wound we did actually have [name] check at this today but that is not the biggest thing I am concerned about at this point. He unfortunately is having some issues here with oxygen saturation dropping to around 84% times and then bouncing back up and down he is breathing rapidly he is also fluctuating with regard to pulse anywhere from 88 that I got manually to it at some points even up at 110 his body does seem to be compensating. He tells me that he sleeps with oxygen but does not wear it during the day typically he has been short of breath for [name] little under [name] week. Shortly after he was here last week. With that being said the patient tells me that he does have [name] history of pulmonary embolisms. He also tells me that he has been having some issues catching his breath with ambulation as well which is also [name] big part of the issue at this point. Fortunately I do not see any signs of obvious infection and is listening to him I could not tell any abnormal breath sounds although he may be [name] little  bit restricted although I cannot be for sure. He tells me that the only thing he may have had [name] little bit of chills but nothing else significant. Nonetheless I think he needs to be evaluated at the ER ASAP. 05-26-2023 upon evaluation today patient appears to be doing well currently in regard to his wounds which are actually significantly better. He did have [name] hospital course where he just got out yesterday he ended up having pulmonary emboli at multiple in fact I think I read 14 that were present and had to be removed. He is now on additional blood thinners and states that he is in [name] good place as far as the blood thinners are concerned. He is feeling much better as well able to breathe quite nicely. I am very pleased with where things stand currently and do not see any signs of infection at this time which is also good news. 05-31-2023 upon evaluation today patient appears to be doing well with regard to his wound  one of them is healed the other is getting close. Unfortunately he is having issues again with his breathing today oxygen saturation around 87 to 90% in general during the evaluation at this point. This has me concerned again as he had previous pulmonary emboli does have an appointment with pulmonology tomorrow but I do not think he needs to wait till tomorrow. 06-07-2023 upon evaluation today patient appears to be doing well currently in regard to his wound. He is tolerating the dressing changes this looks to be doing better in general I think removing in the right direction. I do not see any signs of active infection at this time. No fevers, chills, nausea, vomiting, or diarrhea. 06-28-2023 upon evaluation today patient appears to be doing [name] little worse in regard to his wounds due to the fact that unfortunately this has broken down after he was in the hospital most recently. Again this is the biggest concern that I have at this point. I do believe that he is going to have to go back into compression to get these under control and that he is gena need ongoing compression stockings. 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 His oxygen saturation remains low even today in the clinic with just sitting and not even talking he was bouncing between 88 and 90 I am not so sure he should not have Ryan [name], Ryan [name] ([id number]) [id number] Page 6 of 7 oxygen 24/7 at least at low levels. 07-05-2023 upon evaluation today patient appears to be doing well currently in regard to his wound. In fact the left leg is doing significantly better in fact appears to be healed. The right leg is improving but at the same time is not completely healed as of yet. I fortunately do not see any signs of active infection locally or systemically at this time. No fevers, chills, nausea, vomiting, or diarrhea. 07-12-2023 upon evaluation today patient unfortunately comes in today his wrap side slid down quite  significantly on his legs. Fortunately I do not see any signs of infection but at the same time the wound on the right leg is still open at this point we are working on ordering the juxta fit compression wraps. 07-19-2023 upon evaluation today patient appears to be doing well currently in regard to his wounds. He has been tolerating the dressing changes without complication. Wound does seem to be doing much better. It is going require some sharp debridement. 07-26-2023 upon evaluation today patient appears to be doing decently well  in regard to his wounds at this point. Fortunately I do not see any signs of active infection which is good news and overall I am very pleased with where we stand today. 08-09-2023 upon evaluation today patient appears to be doing well currently in regard to his wounds in fact everything appears to be completely healed in regard to his legs. Fortunately I do not see any signs of active infection at this time. Objective Constitutional Well-nourished and well-hydrated in no acute distress. Vitals Time Taken: 8:07 AM, Height: 67 in, Weight: 338 lbs, BMI: 52.9, Temperature: 97.7 F, Pulse: 88 bpm, Respiratory Rate: 20 breaths/min, Blood Pressure: 159/90 mmHg. Respiratory normal breathing without difficulty. Psychiatric this patient is able to make decisions and demonstrates good insight into disease process. Alert and Oriented x 3. pleasant and cooperative. General Notes: Patient's wound bed showed signs of good granulation epithelization at this point. Fortunately I do not see any signs of worsening and I do believe that the patient is making headway here towards closure. Integumentary (Hair, Skin) Wound #3 status is Open. Original cause of wound was Gradually Appeared. The date acquired was: 06/21/2023. The wound has been in treatment 6 weeks. The wound is located on the Right,Anterior Lower Leg. The wound measures 0cm length x 0cm width x 0cm depth; 0cm^2 area and 0cm^3  volume. There is no tunneling or undermining noted. There is [name] none present amount of drainage noted. There is no granulation within the wound bed. There is no necrotic tissue within the wound bed. Assessment Active Problems ICD-10 Type 2 diabetes mellitus with other skin ulcer Non-pressure chronic ulcer of other part of left lower leg with fat layer exposed Type 2 diabetes mellitus with foot ulcer Non-pressure chronic ulcer of other part of left foot with fat layer exposed Chronic combined systolic (congestive) and diastolic (congestive) heart failure Lymphedema, not elsewhere classified Chronic venous hypertension (idiopathic) with ulcer and inflammation of left lower extremity Essential (primary) hypertension Other specified peripheral vascular diseases Chronic kidney disease, stage 3 unspecified Plan Discharge From [facility] Services: Discharge from Wound Care Center Treatment Complete - compression pumps daily when they arrive Wear compression garments daily. Put garments on first thing when you wake up and remove them before bed. Moisturize legs daily after removing compression garments. Elevate, Exercise Daily and Avoid Standing for Long Periods of Time. DO YOUR BEST to sleep in the bed at night. DO NOT sleep in your recliner. Long hours of sitting in [name] recliner leads to swelling of the legs and/or potential wounds on your backside. DAYMOND, ELLSWORTH [name] ([id number]) [id number] Page 7 of 7 1. I would recommend that we should continue with the recommendation for compression at home but he does have his compression socks and we will get [name] go ahead and continue with allowing him to use these at home at this point. 2. I am going to recommend as well the patient should continue to elevate his legs much as possible to help with edema control. 3. And also been to suggest that he should make sure to monitor his legs frequently for wounds and if anything changes he knows he  should let me know. We will see patient back for reevaluation in 1 week here in the clinic. If anything worsens or changes patient will contact our office for additional recommendations. Electronic Signature(s) Signed: 08/09/2023 10:09:04 AM By: [name] PA-C Entered By: [name] on 08/09/2023 10:09:04 -------------------------------------------------------------------------------- SuperBill Details Patient Name: Date of Service: Ryan [name], Ryan RLES [name]. 08/09/2023 Medical  Record Number: [id number] Patient Account Number: [id number] Date of Birth/Sex: Treating RN: 1971-08-07 (52 y.o. [name]) Yevonne Pax Primary Care Provider: [name] Other Clinician: Referring Provider: Treating Provider/Extender: [name] in Treatment: 16 Diagnosis Coding ICD-10 Codes Code Description E11.622 Type 2 diabetes mellitus with other skin ulcer L97.822 Non-pressure chronic ulcer of other part of left lower leg with fat layer exposed E11.621 Type 2 diabetes mellitus with foot ulcer L97.522 Non-pressure chronic ulcer of other part of left foot with fat layer exposed I50.42 Chronic combined systolic (congestive) and diastolic (congestive) heart failure I89.0 Lymphedema, not elsewhere classified 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 I10 Essential (primary) hypertension I73.89 Other specified peripheral vascular diseases N18.30 Chronic kidney disease, stage 3 unspecified Facility Procedures : CPT4 Code: 40981191 Description: 47829 - WOUND CARE VISIT-LEV 2 EST PT Modifier: Quantity: 1 Physician Procedures : CPT4 Code Description Modifier [id number] - WC PHYS LEVEL 3 - EST PT ICD-10 Diagnosis Description E11.622 Type 2 diabetes mellitus with other skin ulcer L97.822 Non-pressure chronic ulcer of other part of left lower leg with fat layer exposed  E11.621 Type 2 diabetes mellitus with foot ulcer L97.522  Non-pressure chronic ulcer of other part of left foot with fat layer exposed Quantity: 1 Electronic Signature(s) Signed: 08/09/2023 10:30:10 AM By: [name] PA-C Previous Signature: 08/09/2023 9:25:26 AM Version By: Yevonne Pax RN Entered By: [name] on 08/09/2023 10:30:10

## 2023-08-10 NOTE — Telephone Encounter (Signed)
Forms completed, signed and faxed back for the patient.   Called and notified patient that this was done for him.

## 2023-08-16 ENCOUNTER — Telehealth: Payer: Self-pay | Admitting: Nurse Practitioner

## 2023-08-16 ENCOUNTER — Ambulatory Visit: Payer: No Typology Code available for payment source | Admitting: Physician Assistant

## 2023-08-16 NOTE — Telephone Encounter (Signed)
I don't see it scanned in but will reprint what he sent and place in your folder.   Thank you!

## 2023-08-16 NOTE — Telephone Encounter (Signed)
Returned call to patient. Would like to speak with him.

## 2023-08-16 NOTE — Telephone Encounter (Unsigned)
Copied from CRM [id number]. Topic: General - Inquiry >> Aug 16, 2023  3:57 PM De Blanch wrote: Reason for CRM: Pt is stated he was advised by employer of the start date for his FMLA. Started 12/11 and needed it to start 11/18. As he was not feeling well, he did not work on 11/18, and he went to the ED on 11/19. Stated provider doesn't have to resend the entire document; just make an adjustment for the day it started. Can send a note.  Please advise.

## 2023-08-18 ENCOUNTER — Ambulatory Visit (INDEPENDENT_AMBULATORY_CARE_PROVIDER_SITE_OTHER): Payer: No Typology Code available for payment source | Admitting: Internal Medicine

## 2023-08-18 ENCOUNTER — Inpatient Hospital Stay
Admission: EM | Admit: 2023-08-18 | Discharge: 2023-08-23 | DRG: 291 | Disposition: A | Discharge status: Home / Self Care | Payer: No Typology Code available for payment source | Attending: Internal Medicine | Admitting: Internal Medicine

## 2023-08-18 ENCOUNTER — Other Ambulatory Visit: Payer: Self-pay

## 2023-08-18 ENCOUNTER — Emergency Department: Payer: No Typology Code available for payment source

## 2023-08-18 VITALS — BP 140/98 | HR 83 | Ht 67.0 in | Wt 345.0 lb

## 2023-08-18 DIAGNOSIS — I289 Disease of pulmonary vessels, unspecified: Secondary | ICD-10-CM

## 2023-08-18 DIAGNOSIS — I50810 Right heart failure, unspecified: Secondary | ICD-10-CM

## 2023-08-18 DIAGNOSIS — J9621 Acute and chronic respiratory failure with hypoxia: Secondary | ICD-10-CM | POA: Diagnosis present

## 2023-08-18 DIAGNOSIS — E119 Type 2 diabetes mellitus without complications: Secondary | ICD-10-CM

## 2023-08-18 DIAGNOSIS — I130 Hypertensive heart and chronic kidney disease with heart failure and stage 1 through stage 4 chronic kidney disease, or unspecified chronic kidney disease: Secondary | ICD-10-CM | POA: Diagnosis present

## 2023-08-18 DIAGNOSIS — I272 Pulmonary hypertension, unspecified: Secondary | ICD-10-CM | POA: Diagnosis not present

## 2023-08-18 DIAGNOSIS — N1831 Chronic kidney disease, stage 3a: Secondary | ICD-10-CM | POA: Diagnosis present

## 2023-08-18 DIAGNOSIS — R591 Generalized enlarged lymph nodes: Secondary | ICD-10-CM

## 2023-08-18 DIAGNOSIS — Z7984 Long term (current) use of oral hypoglycemic drugs: Secondary | ICD-10-CM | POA: Diagnosis not present

## 2023-08-18 DIAGNOSIS — I999 Unspecified disorder of circulatory system: Secondary | ICD-10-CM

## 2023-08-18 DIAGNOSIS — K760 Fatty (change of) liver, not elsewhere classified: Secondary | ICD-10-CM | POA: Diagnosis present

## 2023-08-18 DIAGNOSIS — L97929 Non-pressure chronic ulcer of unspecified part of left lower leg with unspecified severity: Secondary | ICD-10-CM | POA: Diagnosis present

## 2023-08-18 DIAGNOSIS — Z5181 Encounter for therapeutic drug level monitoring: Secondary | ICD-10-CM

## 2023-08-18 DIAGNOSIS — I10 Essential (primary) hypertension: Secondary | ICD-10-CM | POA: Diagnosis present

## 2023-08-18 DIAGNOSIS — I4892 Unspecified atrial flutter: Secondary | ICD-10-CM | POA: Diagnosis present

## 2023-08-18 DIAGNOSIS — Z86711 Personal history of pulmonary embolism: Secondary | ICD-10-CM

## 2023-08-18 DIAGNOSIS — Z9189 Other specified personal risk factors, not elsewhere classified: Secondary | ICD-10-CM

## 2023-08-18 DIAGNOSIS — I5033 Acute on chronic diastolic (congestive) heart failure: Secondary | ICD-10-CM | POA: Diagnosis present

## 2023-08-18 DIAGNOSIS — I361 Nonrheumatic tricuspid (valve) insufficiency: Secondary | ICD-10-CM | POA: Diagnosis present

## 2023-08-18 DIAGNOSIS — I2782 Chronic pulmonary embolism: Secondary | ICD-10-CM | POA: Diagnosis present

## 2023-08-18 DIAGNOSIS — E1122 Type 2 diabetes mellitus with diabetic chronic kidney disease: Secondary | ICD-10-CM | POA: Diagnosis present

## 2023-08-18 DIAGNOSIS — E872 Acidosis, unspecified: Secondary | ICD-10-CM | POA: Diagnosis present

## 2023-08-18 DIAGNOSIS — I82403 Acute embolism and thrombosis of unspecified deep veins of lower extremity, bilateral: Secondary | ICD-10-CM | POA: Diagnosis not present

## 2023-08-18 DIAGNOSIS — I5082 Biventricular heart failure: Secondary | ICD-10-CM | POA: Diagnosis present

## 2023-08-18 DIAGNOSIS — I2724 Chronic thromboembolic pulmonary hypertension: Secondary | ICD-10-CM | POA: Diagnosis present

## 2023-08-18 DIAGNOSIS — I5032 Chronic diastolic (congestive) heart failure: Secondary | ICD-10-CM | POA: Diagnosis present

## 2023-08-18 DIAGNOSIS — I890 Lymphedema, not elsewhere classified: Secondary | ICD-10-CM

## 2023-08-18 DIAGNOSIS — N1832 Chronic kidney disease, stage 3b: Secondary | ICD-10-CM | POA: Diagnosis not present

## 2023-08-18 DIAGNOSIS — Z95828 Presence of other vascular implants and grafts: Secondary | ICD-10-CM | POA: Diagnosis not present

## 2023-08-18 DIAGNOSIS — R079 Chest pain, unspecified: Principal | ICD-10-CM

## 2023-08-18 DIAGNOSIS — Z888 Allergy status to other drugs, medicaments and biological substances status: Secondary | ICD-10-CM

## 2023-08-18 DIAGNOSIS — Z6841 Body Mass Index (BMI) 40.0 and over, adult: Secondary | ICD-10-CM

## 2023-08-18 DIAGNOSIS — R791 Abnormal coagulation profile: Secondary | ICD-10-CM | POA: Diagnosis present

## 2023-08-18 DIAGNOSIS — Z86718 Personal history of other venous thrombosis and embolism: Secondary | ICD-10-CM

## 2023-08-18 DIAGNOSIS — I5031 Acute diastolic (congestive) heart failure: Secondary | ICD-10-CM | POA: Diagnosis present

## 2023-08-18 DIAGNOSIS — Z8249 Family history of ischemic heart disease and other diseases of the circulatory system: Secondary | ICD-10-CM | POA: Diagnosis not present

## 2023-08-18 DIAGNOSIS — E1151 Type 2 diabetes mellitus with diabetic peripheral angiopathy without gangrene: Secondary | ICD-10-CM | POA: Diagnosis present

## 2023-08-18 DIAGNOSIS — I480 Paroxysmal atrial fibrillation: Secondary | ICD-10-CM | POA: Diagnosis present

## 2023-08-18 DIAGNOSIS — Z79899 Other long term (current) drug therapy: Secondary | ICD-10-CM

## 2023-08-18 DIAGNOSIS — G4731 Primary central sleep apnea: Secondary | ICD-10-CM

## 2023-08-18 DIAGNOSIS — T501X6A Underdosing of loop [high-ceiling] diuretics, initial encounter: Secondary | ICD-10-CM | POA: Diagnosis present

## 2023-08-18 DIAGNOSIS — N183 Chronic kidney disease, stage 3 unspecified: Secondary | ICD-10-CM | POA: Diagnosis present

## 2023-08-18 DIAGNOSIS — I878 Other specified disorders of veins: Secondary | ICD-10-CM | POA: Diagnosis present

## 2023-08-18 DIAGNOSIS — Z881 Allergy status to other antibiotic agents status: Secondary | ICD-10-CM

## 2023-08-18 DIAGNOSIS — Z7985 Long-term (current) use of injectable non-insulin antidiabetic drugs: Secondary | ICD-10-CM

## 2023-08-18 DIAGNOSIS — Z882 Allergy status to sulfonamides status: Secondary | ICD-10-CM

## 2023-08-18 DIAGNOSIS — R801 Persistent proteinuria, unspecified: Secondary | ICD-10-CM

## 2023-08-18 DIAGNOSIS — Z7901 Long term (current) use of anticoagulants: Secondary | ICD-10-CM

## 2023-08-18 DIAGNOSIS — R0602 Shortness of breath: Secondary | ICD-10-CM | POA: Diagnosis present

## 2023-08-18 DIAGNOSIS — Z833 Family history of diabetes mellitus: Secondary | ICD-10-CM

## 2023-08-18 DIAGNOSIS — R0600 Dyspnea, unspecified: Secondary | ICD-10-CM | POA: Diagnosis present

## 2023-08-18 LAB — HEPATIC FUNCTION PANEL
ALT: 29 U/L (ref 0–44)
AST: 31 U/L (ref 15–41)
Albumin: 3.5 g/dL (ref 3.5–5.0)
Alkaline Phosphatase: 55 U/L (ref 38–126)
Bilirubin, Direct: 0.1 mg/dL (ref 0.0–0.2)
Indirect Bilirubin: 0.7 mg/dL (ref 0.3–0.9)
Total Bilirubin: 0.8 mg/dL (ref <1.2)
Total Protein: 7.3 g/dL (ref 6.5–8.1)

## 2023-08-18 LAB — BLOOD GAS, VENOUS
Acid-Base Excess: 0.5 mmol/L (ref 0.0–2.0)
Bicarbonate: 25.4 mmol/L (ref 20.0–28.0)
O2 Saturation: 74.2 %
Patient temperature: 37
pCO2, Ven: 41 mm[Hg] — ABNORMAL LOW (ref 44–60)
pH, Ven: 7.4 (ref 7.25–7.43)
pO2, Ven: 43 mm[Hg] (ref 32–45)

## 2023-08-18 LAB — URINE DRUG SCREEN, QUALITATIVE (ARMC ONLY)
Amphetamines, Ur Screen: NOT DETECTED
Barbiturates, Ur Screen: NOT DETECTED
Benzodiazepine, Ur Scrn: NOT DETECTED
Cannabinoid 50 Ng, Ur ~~LOC~~: NOT DETECTED
Cocaine Metabolite,Ur ~~LOC~~: NOT DETECTED
MDMA (Ecstasy)Ur Screen: NOT DETECTED
Methadone Scn, Ur: NOT DETECTED
Opiate, Ur Screen: NOT DETECTED
Phencyclidine (PCP) Ur S: NOT DETECTED
Tricyclic, Ur Screen: NOT DETECTED

## 2023-08-18 LAB — URINALYSIS, ROUTINE W REFLEX MICROSCOPIC
Bilirubin Urine: NEGATIVE
Hgb urine dipstick: NEGATIVE
Ketones, ur: NEGATIVE
Leukocytes,Ua: NEGATIVE
Nitrite: NEGATIVE
Specific Gravity, Urine: >=1.03 — AB (ref 1.000–1.030)
Total Protein, Urine: >=300 — AB
Urine Glucose: NEGATIVE
Urobilinogen, UA: 0.2 (ref 0.0–1.0)
pH: 6 (ref 5.0–8.0)

## 2023-08-18 LAB — BASIC METABOLIC PANEL
Anion gap: 11 (ref 5–15)
BUN: 18 mg/dL (ref 6–20)
CO2: 23 mmol/L (ref 22–32)
Calcium: 9.2 mg/dL (ref 8.9–10.3)
Chloride: 104 mmol/L (ref 98–111)
Creatinine, Ser: 1.6 mg/dL — ABNORMAL HIGH (ref 0.61–1.24)
GFR, Estimated: 51 mL/min — ABNORMAL LOW (ref >60)
Glucose, Bld: 118 mg/dL — ABNORMAL HIGH (ref 70–99)
Potassium: 4 mmol/L (ref 3.5–5.1)
Sodium: 138 mmol/L (ref 135–145)

## 2023-08-18 LAB — CBC
HCT: 51.1 % (ref 39.0–52.0)
Hemoglobin: 16.3 g/dL (ref 13.0–17.0)
MCH: 31 pg (ref 26.0–34.0)
MCHC: 31.9 g/dL (ref 30.0–36.0)
MCV: 97.1 fL (ref 80.0–100.0)
Platelets: 281 10*3/uL (ref 150–400)
RBC: 5.26 MIL/uL (ref 4.22–5.81)
RDW: 14.4 % (ref 11.5–15.5)
WBC: 5.1 10*3/uL (ref 4.0–10.5)
nRBC: 0 % (ref 0.0–0.2)

## 2023-08-18 LAB — MICROALBUMIN / CREATININE URINE RATIO
Creatinine,U: 204.5 mg/dL
Microalb Creat Ratio: 87.5 mg/g — ABNORMAL HIGH (ref 0.0–30.0)
Microalb, Ur: 178.9 mg/dL — ABNORMAL HIGH (ref 0.0–1.9)

## 2023-08-18 LAB — PROTIME-INR
INR: 1.6 — ABNORMAL HIGH (ref 0.8–1.2)
Prothrombin Time: 19.6 s — ABNORMAL HIGH (ref 11.4–15.2)

## 2023-08-18 LAB — APTT: aPTT: 27 s (ref 24–36)

## 2023-08-18 LAB — TROPONIN I (HIGH SENSITIVITY)
Troponin I (High Sensitivity): 77 ng/L — ABNORMAL HIGH (ref <18)
Troponin I (High Sensitivity): 81 ng/L — ABNORMAL HIGH (ref <18)

## 2023-08-18 LAB — LACTIC ACID, PLASMA: Lactic Acid, Venous: 1.4 mmol/L (ref 0.5–1.9)

## 2023-08-18 MED ORDER — HYDROCODONE-ACETAMINOPHEN 5-325 MG PO TABS: 1.0000 | ORAL_TABLET | ORAL | Status: DC | PRN

## 2023-08-18 MED ORDER — ACETAMINOPHEN 325 MG PO TABS: 650.0000 mg | ORAL_TABLET | Freq: Four times a day (QID) | ORAL | Status: DC | PRN

## 2023-08-18 MED ORDER — ACETAMINOPHEN 650 MG RE SUPP
650.0000 mg | Freq: Four times a day (QID) | RECTAL | Status: DC | PRN
Start: 2023-08-18 — End: 2023-08-22

## 2023-08-18 MED ORDER — ONDANSETRON HCL 4 MG PO TABS
4.0000 mg | ORAL_TABLET | Freq: Three times a day (TID) | ORAL | 0 refills | Status: DC | PRN
  Filled 2023-08-18: qty 20, 7d supply, fill #0

## 2023-08-18 MED ORDER — ASPIRIN 81 MG PO TBEC
81.0000 mg | DELAYED_RELEASE_TABLET | Freq: Every day | ORAL | Status: DC
  Administered 2023-08-18 – 2023-08-23 (×6): 81 mg via ORAL
  Filled 2023-08-18 (×6): qty 1

## 2023-08-18 MED ORDER — SODIUM CHLORIDE 0.9 % IV SOLN
250.0000 mL | INTRAVENOUS | Status: DC | PRN
Start: 2023-08-18 — End: 2023-08-19

## 2023-08-18 MED ORDER — HEPARIN BOLUS VIA INFUSION
6000.0000 [IU] | Freq: Once | INTRAVENOUS | Status: AC
Start: 2023-08-18 — End: 2023-08-18
  Administered 2023-08-18: 6000 [IU] via INTRAVENOUS
  Filled 2023-08-18: qty 6000

## 2023-08-18 MED ORDER — TIRZEPATIDE 7.5 MG/0.5ML ~~LOC~~ SOAJ
7.5000 mg | SUBCUTANEOUS | 0 refills | Status: DC
  Filled 2023-08-18: qty 2, 28d supply, fill #0

## 2023-08-18 MED ORDER — SODIUM CHLORIDE 0.9% FLUSH
3.0000 mL | Freq: Two times a day (BID) | INTRAVENOUS | Status: DC
  Administered 2023-08-18 – 2023-08-23 (×9): 3 mL via INTRAVENOUS

## 2023-08-18 MED ORDER — SODIUM CHLORIDE 0.9% FLUSH
3.0000 mL | Freq: Two times a day (BID) | INTRAVENOUS | Status: DC
  Administered 2023-08-18: 3 mL via INTRAVENOUS

## 2023-08-18 MED ORDER — IOHEXOL 350 MG/ML SOLN
100.0000 mL | Freq: Once | INTRAVENOUS | Status: AC | PRN
  Administered 2023-08-18: 100 mL via INTRAVENOUS

## 2023-08-18 MED ORDER — SODIUM CHLORIDE 0.9% FLUSH: 3.0000 mL | INTRAVENOUS | Status: DC | PRN

## 2023-08-18 MED ORDER — HEPARIN (PORCINE) 25000 UT/250ML-% IV SOLN
1500.0000 [IU]/h | INTRAVENOUS | Status: DC
  Administered 2023-08-18 – 2023-08-20 (×4): 1650 [IU]/h via INTRAVENOUS
  Administered 2023-08-21 – 2023-08-22 (×2): 1500 [IU]/h via INTRAVENOUS
  Filled 2023-08-18 (×6): qty 250

## 2023-08-18 MED ORDER — MORPHINE SULFATE (PF) 2 MG/ML IV SOLN: 2.0000 mg | INTRAVENOUS | Status: DC | PRN

## 2023-08-18 NOTE — Assessment & Plan Note (Addendum)
Sleep study  done Nov 2024  was truncated  due to acute respiratory failure requirng ER evaluation,  but noted severe central sleep apnea , AHI 115 during truncated study.   e Has been advised to reschedule (ordered by his pulmonologist)

## 2023-08-18 NOTE — ED Triage Notes (Signed)
Pt here with SOB and cp x1 week. Pt has a hx of CHF, PE, DVT and is concerned about a clot. Pt states he feels like he is having a hard time catching his breath. Pt ambulatory to triage.

## 2023-08-18 NOTE — Progress Notes (Signed)
Subjective:  Patient ID: Ryan [name], male    DOB: [birth year]  Age: 52 y.o. MRN: [id number]  CC: The primary encounter diagnosis was History of pulmonary embolism and DVT. 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  HPI Ryan [name] presents for establishment of care. He has a complicated medical history (see below) . He is a morbidly obese male with recurrent DVT/PE chronic PE with right heart srain,  atrial fibrillation,   diastolic dysfunction, OSA,  chronic noctural hypoxemia treated with supplemental oxygen,  and type  2 DM with CKD stage 3 a .      RECURRENT DVT/PE  : Thrombotic Events have been occurring since his early 20's,.  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  His Weight was 185 lbs at that time.  He states that his PCP at the time was the now deceased  Dr Maryruth Bun , who referred  him to hematology for hypercoagulable workup but to his knowledge,  no identifiable cause has been found despite multiple evaluations.  (Review of chart notes several references to possible nonadherence. However the  recurrent events while anticoagulated on Xarelto and Eliquis may have been due to subtherapeutic dosing given his  BMI> 40).  He has had 2) IVC filters placed with no plans to remove them.  Despite being  anticoagulated with warfarin  and has had recurrent events .  It appears that his goal INR is 2.5 to 3.5 and he takes 5 mg coumadin daily .    His most recent  ER visit was Nov 19 when he was sent to ER from the sleep lap  due to orthopnea and hypoxemia.  INR was therapeutic,  troponins were chronically elevated but unchanged from baseline,  and chest x ray was clear.  CTA was offered but declined by patient and he was dose with coumadin, diuresed and sent home.  He has hot rescheduled the sleep sutdy  the symptoms occurred at the beginning of his Sleep study , coumadin level was low,  was diuresed and sent home.  Coumadin clinic is managing his warfarin  5 mg daily .  Currently taking furosemide 40 mg every other DAY  using 2 L 02 at night.  Has not had sleep study rescheduled.  He has chronic orthopnea and sleeps on 3 pillows, and is a side and stomach sleeper. His pul,onary issues are managed by [facility] at [facility].    His most recent hospitalization occurred from Oct 15 to Oct 24 for  acute on chronic PE,  infected chronic LLE ulcer,   atrial flutter,  an acute on chronic  kidney injury with lactic acidosis, . INR was subtherapeutic  (I.6 on Oct 15) . Blood cultures were negative. No thrombectomy was attempted due to scarring of internal jugular vein from prior thrombectomies (most recent thrombectomy Sept 2024)  .  CTA reviewed:   Large chronic nonocclusive thrombus in the right main pulmonary artery.  Small chronic nonocclusive thrombus in the distal left main pulmonary artery. New small pulmonary embolus involving a segmental branch artery within the left upper lobe. Similar findings of chronic right heart strain with elevated RV to LV ratio of approximately 1.9. 2. Lungs are clear. No evidence of pulmonary infarction. He was cardioverted,  sepsis ruled out, and discharged home on cephalexin with referral to [facility]ound [facility].  every other day furosemide was prescribed for management of chf/edema.     History of thrombectomy  SEPT 2024 BY Dr . Gilda Crease  during admission for acute PE  occurring during subtherapeutic INR ( medication lapse reported by patient ) .CTA noted Acute on chronic right main pulmonary artery and interval increase nonocclusive thrombus in the left lower lung lobe lobar pulmonary artery  with  compressed left ventricle, compatible with right heart failure/strain ' Obesity:  currently his BASELINE WEIGHT SEEMS TO BE 340 TO 345 LBS.  He has struggled with weight for the last 15  years but has lost   20 lbs since starting mounjaro  2 months ago   DIABETES :  currently managed  with MOUNJARO 5 MG WEEKLY  .  Metformin was stopped due to GFR < 50 and lactic acidosis on prior admission   Lab Results  Component Value Date   HGBA1C 6.1 (H) 06/14/2023   ChronIC venous insufficiency /lymphedema :  he has had two LE ulcers in the  last several months,   a left anke ulcer followed by a right lateral LE ulcer during last hospitalization.  Both have resolved by Wound Center with wound care and increased protein intake .  He is not pumping or wearing compression garmets.    Renal:  history of AKI  in the setting of acute DVT  in 2016 , during hospitalization he developed contrast nephropathy with anuria and Cr rose to 14 .  He received hemodialysis for approximately 2 months (April to June 2016) via right IJ HD catheter.   He was referred to [facility] nephrology In 2016  for proteinuria which was minimal , so he was  discharged from nephrology care.   He states that he   has not see a nephrologist in over 2 years  (per chart,  not since 2014)     History Meritt has a past medical history of (HFpEF) heart failure with preserved ejection fraction (HCC), Arrhythmia, CHF (congestive heart failure) (HCC), Chronic kidney disease (08/2013), Diabetes mellitus type II, non insulin dependent (HCC), Diabetes mellitus without complication (HCC), DVT (deep venous thrombosis) (HCC), Hepatic steatosis, Hypertension, Morbid obesity with BMI of 50.0-59.9, adult  (HCC), Oxygen deficiency, Peripheral vascular disease (HCC), Presence of IVC filter, Pulmonary embolus (HCC) (12/2016), Ventricular trigeminy, and Weakness of both lower limbs.   He has a past surgical history that includes Cardiac catheterization (N/A, 01/10/2015); Cardiac catheterization (N/A, 02/24/2015); Cardiac electrophysiology study and ablation (10/2020); PULMONARY THROMBECTOMY (N/A, 12/13/2022); PULMONARY THROMBECTOMY (N/A, 05/17/2023); TEE without cardioversion (N/A, 06/21/2023); and Cardioversion (N/A, 06/21/2023).   His family history includes Breast cancer in his mother; Cancer in his mother; Deep vein thrombosis in his cousin; Diabetes in his father; Heart disease in his maternal grandmother; Hypertension in his father and mother; Prostate cancer in his father; Pulmonary embolism in his cousin, paternal aunt, paternal grandfather, and paternal uncle.He reports that he has never smoked. He has never used smokeless tobacco. He reports that he does not drink alcohol and does not use drugs.  No facility-administered medications prior to visit.   Outpatient Medications  Prior to Visit  Medication Sig Dispense Refill   albuterol (VENTOLIN HFA) 108 (90 Base) MCG/ACT inhaler Inhale 2 puffs into the lungs every 6 (six) hours as needed for wheezing or shortness of breath. 8 g 2   ascorbic acid (VITAMIN C) 500 MG tablet Take 1 tablet (500 mg total) by mouth 2 (two) times daily. (Patient not taking: Reported on 08/18/2023) 30 tablet 0   digoxin 62.5 MCG TABS Take 0.0625 mg by mouth daily. 30 tablet 0   furosemide (LASIX) 40 MG tablet TAKE 1 TABLET(40 MG) BY MOUTH EVERY OTHER DAY 90 tablet 0   losartan (COZAAR) 25 MG tablet Take 1 tablet (25 mg total) by mouth daily. 30 tablet 0   metoprolol succinate (TOPROL-XL) 50 MG 24 hr tablet Take 1 tablet (50 mg total) by mouth daily. Take with or immediately following a meal. 30 tablet 0   Multiple Vitamin (MULTIVITAMIN WITH MINERALS) TABS tablet Take 1  tablet by mouth daily. 30 tablet 0   OXYGEN Inhale 1 L into the lungs daily.     spironolactone (ALDACTONE) 25 MG tablet Take 1 tablet (25 mg total) by mouth daily. 30 tablet 0   warfarin (COUMADIN) 5 MG tablet Take 1 tablet (5 mg total) by mouth daily. 90 tablet 1   neomycin-bacitracin-polymyxin [phone] OINT Apply 1 Application topically 3 (three) times daily. 15 g 0    Review of Systems:  Patient denies headache, fevers, malaise, unintentional weight loss, skin rash, eye pain, sinus congestion and sinus pain, sore throat, dysphagia,  hemoptysis , cough, wheezing, chest pain, palpitations, orthopnea, edema, abdominal pain, nausea, melena, diarrhea, constipation, flank pain, dysuria, hematuria, urinary  Frequency, nocturia, numbness, tingling, seizures,  Focal weakness, Loss of consciousness,  Tremor, insomnia, depression, anxiety, and suicidal ideation.     Objective:  BP (!) 140/98   Pulse 83   Ht 5\' 7"  (1.702 m)   Wt (!) 345 lb (156.5 kg)   SpO2 93%   BMI 54.03 kg/m   Physical Exam Vitals reviewed.  Constitutional:      General: He is not in acute distress.    Appearance: Normal appearance. He is obese. He is not ill-appearing, toxic-appearing or diaphoretic.  HENT:     Head: Normocephalic.  Eyes:     General: No scleral icterus.       Right eye: No discharge.        Left eye: No discharge.     Conjunctiva/sclera: Conjunctivae normal.  Cardiovascular:     Rate and Rhythm: Normal rate and regular rhythm.     Heart sounds: Normal heart sounds.  Pulmonary:     Effort: Pulmonary effort is normal. No respiratory distress.     Breath sounds: Normal breath sounds.  Musculoskeletal:        General: Normal range of motion.     Cervical back: Normal range of motion.  Skin:    General: Skin is warm and dry.  Neurological:     General: No focal deficit present.     Mental Status: He is alert and oriented to person, place, and time. Mental status is at baseline.   Psychiatric:        Mood and Affect: Mood normal.        Behavior: Behavior normal.        Thought Content: Thought content normal.        Judgment: Judgment normal.    Assessment & Plan:  History of pulmonary embolism and DVT  At risk for  sleep apnea Assessment & Plan: Sleep study  done Nov 2024  was truncated  due to acute respiratory failure requirng ER evaluation,  but noted severe central sleep apnea , AHI 115 during truncated study.   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 Sleep study was aborted due to patient's report of orthopnea and he was transferred to ER.  He will need a titration study and likely BIPAP     Anticoagulation goal of INR 2.5 to 3.5 Assessment & Plan: Goal INR is 2.5 to 3 given recurrent thrombotic events with  INRs < 2.0    Chronic pulmonary embolism without acute cor pulmonale, unspecified pulmonary embolism type (HCC) Assessment & Plan: Etiology of recurrent PE appears to be multifactorial :  untreated central (and likely obstructive sleep apnea, morbid obesity complicating anticoagulation efforts,  and medical nonadherence.  He has had hematologic evaluations which have been "Inconclusive" fper patient but negative per review of hematology notes . Recent summary by  [facility] during [place] visit reviewed "  , he  is s/p IVC filter placement x2 (2005, 2012) and RLE bleeding varicosity s/p sclerotherapy 02/2020. Given patient's history of multiple VTE, and given that  patient currently has not had any active ulcerations on his BLE., he was note advised to attempt  IVC retrieval .   However given his recent leg ulcers, this may need to be revisited.    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 Unclear if lymphedema predates his histor yof DVT/PE .  Patient has not used his lympedema pumps in years .  Recent venous leg ulcers now healed.     S/P insertion of IVC (inferior vena caval) filter  Lymphadenopathy Assessment & Plan: Referred for biopsy but not done per oncology notes.  Repeat CT suggests benign etiology    Diabetes mellitus treated with injections of non-insulin medication (HCC) Assessment & Plan: Advised to incresae Mounjaro dose to 7.5 mg weekly to continue weight loss  Lab Results  Component Value Date   HGBA1C 6.1 (H) 06/14/2023      Persistent proteinuria Assessment & Plan: Unclear if nephrotic range,  which would increase his propensity for DVT/PE.  Referring to nephrology. SPEP/ Urine IFE ordered   Lab Results  Component Value Date   LABMICR 824.2 10/15/2022   LABMICR See below: 03/18/2022   MICROALBUR 178.9 (H) 08/18/2023        Other orders -     Tirzepatide; Inject 7.5 mg into the skin once a week.  Dispense: 2 mL; Refill: 0 -     Ondansetron HCl; Take 1 tablet (4 mg total) by mouth every 8 (eight) hours as needed for nausea or vomiting.  Dispense: 20 tablet; Refill: 0     Follow-up: Return in about 4 weeks (around 09/15/2023).   I spent over 60   minutes on the day of this  face to face  encounter reviewing patient's last visit with previous provider,  most recent hospitalizations  imaging studies and labs, providing  counseling on the above mentioned problems and coordination of care.   Sherlene Shams, MD

## 2023-08-18 NOTE — ED Provider Notes (Signed)
[facility] Provider Note    Event Date/Time   First MD Initiated Contact with Patient 08/18/23 1453     (approximate)   History   Chief Complaint: Shortness of Breath and Chest Pain   HPI  Ryan [name] is a 52 y.o. male with a history of CHF, morbid obesity, CKD, diabetes, hypertension, pulmonary embolism on warfarin who comes ED complaining of shortness of breath and chest pain for the past week.  Continuous, waxing and waning, worse with walking and exertion, better with rest, associated with shortness of breath.  Feels sharp in the center of the chest.  Nonradiating.  No palpitations or dizziness or syncope.  No fever          Physical Exam   Triage Vital Signs: ED Triage Vitals [08/18/23 1323]  Encounter Vitals Group     BP (!) 148/100     Systolic BP Percentile      Diastolic BP Percentile      Pulse Rate 87     Resp (!) 22     Temp 97.6 F (36.4 C)     Temp Source Oral     SpO2 94 %     Weight (!) 345 lb 0.3 oz (156.5 kg)     Height 5\' 7"  (1.702 m)     Head Circumference      Peak Flow      Pain Score 5     Pain Loc      Pain Education      Exclude from Growth Chart     Most recent vital signs: Vitals:   08/18/23 1600 08/18/23 1811  BP: 116/85 (!) 130/100  Pulse: 76 67  Resp: 20 (!) 21  Temp:    SpO2: 93% 90%    General: Awake, no distress.  CV:  Good peripheral perfusion.  Regular rate rhythm Resp:  Normal effort.  Clear to auscultation bilaterally Abd:  No distention.  Soft nontender Other:  No lower extremity edema, symmetric calf circumference   ED Results / Procedures / Treatments   Labs (all labs ordered are listed, but only abnormal results are displayed) Labs Reviewed  BASIC METABOLIC PANEL - Abnormal; Notable for the following components:      Result Value   Glucose, Bld 118 (*)    Creatinine, Ser 1.60 (*)    GFR, Estimated 51 (*)    All other components within normal limits  PROTIME-INR - Abnormal;  Notable for the following components:   Prothrombin Time 19.6 (*)    INR 1.6 (*)    All other components within normal limits  TROPONIN I (HIGH SENSITIVITY) - Abnormal; Notable for the following components:   Troponin I (High Sensitivity) 77 (*)    All other components within normal limits  TROPONIN I (HIGH SENSITIVITY) - Abnormal; Notable for the following components:   Troponin I (High Sensitivity) 81 (*)    All other components within normal limits  CBC  HEPATIC FUNCTION PANEL  URINE DRUG SCREEN, QUALITATIVE ([facility] ONLY)     EKG Interpreted by me Sinus rhythm rate of 96.  Right axis, normal intervals.  Poor R wave progression.  Normal ST segments and T waves.   RADIOLOGY Chest x-ray interpreted by me, unremarkable.  Radiology report reviewed   PROCEDURES:  Procedures   MEDICATIONS ORDERED IN ED: Medications  iohexol (OMNIPAQUE) 350 MG/ML injection 100 mL (100 mLs Intravenous Contrast Given 08/18/23 1624)     IMPRESSION / MDM / ASSESSMENT  AND PLAN / ED COURSE  I reviewed the triage vital signs and the nursing notes.  DDx: Pneumothorax, pneumonia, pleural effusion, pulmonary edema, non-STEMI, pulmonary embolism.  Doubt dissection  Patient's presentation is most consistent with acute presentation with potential threat to life or bodily function.  Patient presents with sharp exertional chest pain for the past week.  Has multiple comorbidities.  INR is subtherapeutic. Will need to obtain CT angiogram of the chest to evaluate for PE.  If negative, will recommend hospitalization for further cardiac workup    ----------------------------------------- 7:17 PM on 08/18/2023 ----------------------------------------- CTA chest negative for acute PE, does show extensive chronic PE burden.  Still having some shortness of breath.  Discussed with hospitalist for further management.     FINAL CLINICAL IMPRESSION(S) / ED DIAGNOSES   Final diagnoses:  Chest pain with moderate  risk for cardiac etiology  Morbid obesity (HCC)     Rx / DC Orders   ED Discharge Orders     None        Note:  This document was prepared using Dragon voice recognition software and may include unintentional dictation errors.   Sharman Cheek, MD 08/18/23 [phone]

## 2023-08-18 NOTE — Progress Notes (Signed)
PHARMACY - ANTICOAGULATION CONSULT NOTE  Pharmacy Consult for Heparin infusion Indication:  PE/A.FIb/ DVT/ IVC filter and SVC filter  Allergies  Allergen Reactions   Metrizamide Other (See Comments)    Kidney failure requiring dialysis Kidney failure requiring dialysis    Clindamycin/Lincomycin Rash   Lincomycin Rash   Sulfa Antibiotics Rash    Patient Measurements: Height: 5\' 7"  (170.2 cm) Weight: (!) 156.5 kg (345 lb 0.3 oz) IBW/kg (Calculated) : 66.1 Heparin Dosing Weight: 104.8 kg  Vital Signs: Temp: 97.6 F (36.4 C) (12/19 1323) Temp Source: Oral (12/19 1323) BP: 130/100 (12/19 1811) Pulse Rate: 67 (12/19 1811)  Labs: Recent Labs    08/18/23 1327 08/18/23 1558  HGB 16.3  --   HCT 51.1  --   PLT 281  --   LABPROT 19.6*  --   INR 1.6*  --   CREATININE 1.60*  --   TROPONINIHS 77* 81*    Estimated Creatinine Clearance: 77.3 mL/min (A) (by C-G formula based on SCr of 1.6 mg/dL (H)).   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  Regular the workup has not been forthcoming) previously followed by Dr. Isaiah Serge at [facility]   Hepatic steatosis    Hypertension    Morbid obesity with BMI of 50.0-59.9, adult (HCC)    Oxygen deficiency    Peripheral vascular disease (HCC)    Presence of IVC filter    Has 2 IVC filters with significant thrombus burden superior to the filter.   Pulmonary embolus (HCC) 12/2016   Initially just treated with anticoagulation, but in February 2023 in April 2024 treated with EKOS thrombectomy for submassive PE.   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 On admission, patient's INR was subtherapeutic at 1.6 (INR goal per Anticoagulation clinic note is 2.5-3.5). Home warfarin dose appears to be warfarin 5 mg daily as of 07/27/23 Anticoagulation Clinic note (patient's med rec is pending). At this time, patient had a therapeutic INR of 2.8 on this regimen. Last dose of warfarin was 12/18 PM. Pharmacy was consulted to transition patient to a heparin infusion for PE. CT Chest showed: Stable chronic right-sided PE. Left-sided PE present previously has resolved.  Baseline INR 1.6, aPTT ordered.  No signs/symptoms of bleeding noted in chart. Hgb 16.3. PLT 281.  Goal of Therapy:  Heparin level 0.3-0.7 units/ml Monitor platelets by anticoagulation protocol: Yes   Plan:  Since patient's INR is below goal, can go ahead and start patient of UFH Give 6000 unit bolus x 1 Start heparin infusion rate at 1650 units/hr Check HL in 6 hours Monitor CBC  daily   Merryl Hacker, PharmD Clinical Pharmacist 08/18/2023,7:25 PM

## 2023-08-18 NOTE — H&P (Signed)
History and Physical    Patient: Ryan [name]:[id number] DOB: [birth year] DOA: 08/18/2023 DOS: the patient was seen and examined on 08/19/2023 PCP: Sherlene Shams, MD  Patient coming from: Home  Chief Complaint  Patient presents with   Shortness of Breath   Chest Pain   HPI: Ryan [name] is a 52 y.o. 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 Had mechanical thrombectomy with vascular surgery on September 17. Was maintained in the hospital until INR is therapeutic with a goal INR of 3. Per patient, he has been compliant with Coumadin.  Patient was against a on June 14, 2023 respiratory distress.  In emergency room vitals trend shows: Vitals:   08/19/23 0205 08/19/23 0210 08/19/23 0230 08/19/23 0245  BP:   (!) 141/99   Pulse: 65 64 63 67  Temp:      Resp: 18 17 19 20   Height:      Weight:      SpO2: (!) 87% (!) 88% 91% 94%  TempSrc:      BMI (Calculated):      Labs are notable for : Metabolic panel showing glucose of 118 consistent with his chronic kidney disease stage IIIb normal LFTs, Initial troponin 70 7 repeat of 81 BNP ordered and pending EKG today shows Sinus rhythm at 90s PR interval 150 axis deviation right atrial enlargement, QTc 439 ST depression in 2-3 lateral leads and anterior leads comparison to previous EKG it is similar but not completely there are differences but nonspecific.  In the ED pt received: Medications  sodium chloride flush (NS) 0.9 % injection 3 mL (3 mLs Intravenous Given 08/18/23 2118)  acetaminophen (TYLENOL) tablet 650 mg  (has no administration in time range)    Or  acetaminophen (TYLENOL) suppository 650 mg (has no administration in time range)  HYDROcodone-acetaminophen (NORCO/VICODIN) 5-325 MG per tablet 1 tablet (has no administration in time range)  morphine (PF) 2 MG/ML injection 2 mg (has no administration in time range)  aspirin EC tablet 81 mg (81 mg Oral Given 08/18/23 2111)  heparin ADULT infusion 100 units/mL (25000 units/244mL) (1,650 Units/hr Intravenous New Bag/Given 08/18/23 2110)  albuterol (PROVENTIL) (2.5 MG/3ML) 0.083% nebulizer solution 2.5 mg (has no administration in time range)  digoxin (LANOXIN) tablet 0.0625 mg (has no administration in time range)  metoprolol succinate (TOPROL-XL) 24 hr tablet 50 mg (has no administration in time range)  spironolactone (ALDACTONE) tablet 25 mg (has no administration in time range)  iohexol (OMNIPAQUE) 350 MG/ML injection 100 mL (100 mLs Intravenous Contrast Given 08/18/23 1624)  heparin bolus via infusion 6,000 Units (6,000 Units Intravenous Bolus from Bag 08/18/23 2111)   Review of Systems  Respiratory:  Positive for hemoptysis, sputum production, shortness of breath and wheezing.   Cardiovascular:  Positive for chest pain, palpitations and leg swelling.  All other systems reviewed and are negative.  Past Medical History:  Diagnosis Date   (HFpEF) heart failure with preserved ejection fraction (HCC)    Arrhythmia    atrial fibrillation   CHF (congestive heart failure) (HCC)    Chronic kidney disease 08/2013   Diabetes mellitus  type II, non insulin dependent (HCC)    Diabetes mellitus without complication (HCC)    per pt-pre diabetic-Dr Dario Guardian stated said pt   DVT (deep venous thrombosis) (HCC)    Over 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  Regular the workup has not been forthcoming) previously followed by Dr. Isaiah Serge at [facility]   Hepatic steatosis    Hypertension    Morbid obesity with BMI of 50.0-59.9, adult (HCC)    Oxygen deficiency    Peripheral vascular  disease (HCC)    Presence of IVC filter    Has 2 IVC filters with significant thrombus burden superior to the filter.   Pulmonary embolus (HCC) 12/2016   Initially just treated with anticoagulation, but in February 2023 in April 2024 treated with EKOS thrombectomy for submassive PE.   Ventricular trigeminy    Weakness of both lower limbs    Past Surgical History:  Procedure Laterality Date   CARDIAC ELECTROPHYSIOLOGY STUDY AND ABLATION  10/2020   CARDIOVERSION N/A 06/21/2023   Procedure: CARDIOVERSION;  Surgeon: Dorthula Nettles, DO;  Location: [facility] ORS;  Service: Cardiovascular;  Laterality: N/A;   PERIPHERAL VASCULAR CATHETERIZATION N/A 01/10/2015   Procedure: Dialysis/Perma Catheter Insertion;  Surgeon: Renford Dills, MD;  Location: [facility] INVASIVE CV LAB;  Service: Cardiovascular;  Laterality: N/A;   PERIPHERAL VASCULAR CATHETERIZATION N/A 02/24/2015   Procedure: Dialysis/Perma Catheter Removal;  Surgeon: Annice Needy, MD;  Location: [facility] INVASIVE CV LAB;  Service: Cardiovascular;  Laterality: N/A;   PULMONARY THROMBECTOMY N/A 12/13/2022   Procedure: PULMONARY THROMBECTOMY;  Surgeon: Annice Needy, MD;  Location: [facility] INVASIVE CV LAB;  Service: Cardiovascular;  Laterality: N/A;   PULMONARY THROMBECTOMY N/A 05/17/2023   Procedure: PULMONARY THROMBECTOMY;  Surgeon: Renford Dills, MD;  Location: [facility] INVASIVE CV LAB;  Service: Cardiovascular;  Laterality: N/A;   TEE WITHOUT CARDIOVERSION N/A 06/21/2023   Procedure: TRANSESOPHAGEAL ECHOCARDIOGRAM (TEE);  Surgeon: Dorthula Nettles, DO;  Location: [facility] ORS;  Service: Cardiovascular;  Laterality: N/A;    reports that he has never smoked. He has never used smokeless tobacco. He reports that he does not drink alcohol and does not use drugs.  Allergies  Allergen Reactions   Metrizamide Other (See Comments)    Kidney failure requiring dialysis Kidney failure requiring dialysis    Clindamycin/Lincomycin Rash   Lincomycin Rash   Sulfa  Antibiotics Rash    Family History  Problem Relation Age of Onset   Hypertension Mother    Cancer Mother    Breast cancer Mother    Prostate cancer Father    Hypertension Father    Diabetes Father    Heart disease Maternal Grandmother    Pulmonary embolism Paternal Grandfather    Pulmonary embolism Paternal Uncle    Pulmonary embolism Cousin    Deep vein thrombosis Cousin    Pulmonary embolism Paternal Aunt     Prior to Admission medications   Medication Sig Start Date End Date Taking? Authorizing Provider  albuterol (VENTOLIN HFA) 108 (90 Base) MCG/ACT inhaler Inhale 2 puffs into the lungs every 6 (six) hours as needed for wheezing or shortness of breath. 02/23/22   Sunnie Nielsen, DO  ascorbic acid (VITAMIN C) 500 MG tablet Take 1 tablet (500 mg total) by mouth 2 (two) times daily. 06/23/23   Loyce Dys, MD  digoxin 62.5 MCG TABS Take 0.0625 mg by mouth daily. 06/24/23   Loyce Dys, MD  furosemide (LASIX) 40 MG tablet TAKE 1 TABLET(40 MG) BY MOUTH EVERY OTHER DAY 07/14/23  [name], NP  losartan (COZAAR) 25 MG tablet Take 1 tablet (25 mg total) by mouth daily. 06/24/23   Loyce Dys, MD  metoprolol succinate (TOPROL-XL) 50 MG 24 hr tablet Take 1 tablet (50 mg total) by mouth daily. Take with or immediately following a meal. 06/24/23   Loyce Dys, MD  Multiple Vitamin (MULTIVITAMIN WITH MINERALS) TABS tablet Take 1 tablet by mouth daily. 06/24/23   Loyce Dys, MD  ondansetron (ZOFRAN) 4 MG tablet Take 1 tablet (4 mg total) by mouth every 8 (eight) hours as needed for nausea or vomiting. 08/18/23   Sherlene Shams, MD  OXYGEN Inhale 1 L into the lungs daily.    [provider]  spironolactone (ALDACTONE) 25 MG tablet Take 1 tablet (25 mg total) by mouth daily. 06/24/23   Loyce Dys, MD  tirzepatide [facility]) 7.5 MG/0.5ML Pen Inject 7.5 mg into the skin once a week. 08/18/23   Sherlene Shams, MD  warfarin (COUMADIN) 5 MG tablet Take 1  tablet (5 mg total) by mouth daily. 06/06/23   [name], NP     Vitals:   08/19/23 0205 08/19/23 0210 08/19/23 0230 08/19/23 0245  BP:   (!) 141/99   Pulse: 65 64 63 67  Resp: 18 17 19 20   Temp:      TempSrc:      SpO2: (!) 87% (!) 88% 91% 94%  Weight:      Height:       Physical Exam Vitals and nursing note reviewed.  Constitutional:      General: He is not in acute distress.    Appearance: He is obese.  HENT:     Head: Normocephalic and atraumatic.     Right Ear: Hearing normal.     Left Ear: Hearing normal.     Nose: Nose normal. No nasal deformity.     Mouth/Throat:     Lips: Pink.     Tongue: No lesions.     Pharynx: Oropharynx is clear.  Eyes:     General: Lids are normal.     Extraocular Movements: Extraocular movements intact.  Cardiovascular:     Rate and Rhythm: Normal rate and regular rhythm.     Heart sounds: Normal heart sounds.  Pulmonary:     Effort: Pulmonary effort is normal.     Breath sounds: Normal breath sounds.  Abdominal:     General: Bowel sounds are normal. There is no distension.     Palpations: Abdomen is soft. There is no mass.     Tenderness: There is no abdominal tenderness.  Musculoskeletal:     Right lower leg: No edema.     Left lower leg: No edema.  Skin:    General: Skin is warm.  Neurological:     General: No focal deficit present.     Mental Status: He is alert and oriented to person, place, and time.     Cranial Nerves: Cranial nerves 2-12 are intact.  Psychiatric:        Attention and Perception: Attention normal.        Mood and Affect: Mood normal.        Speech: Speech normal.        Behavior: Behavior normal. Behavior is cooperative.      Labs on Admission: I have personally reviewed following labs and imaging studies Results for orders placed or performed during the hospital encounter of 08/18/23 (from the past 24 hours)  Basic metabolic  panel     Status: Abnormal   Collection Time: 08/18/23  1:27 PM   Result Value Ref Range   Sodium 138 135 - 145 mmol/L   Potassium 4.0 3.5 - 5.1 mmol/L   Chloride 104 98 - 111 mmol/L   CO2 23 22 - 32 mmol/L   Glucose, Bld 118 (H) 70 - 99 mg/dL   BUN 18 6 - 20 mg/dL   Creatinine, Ser 1.61 (H) 0.61 - 1.24 mg/dL   Calcium 9.2 8.9 - 09.6 mg/dL   GFR, Estimated 51 (L) >60 mL/min   Anion gap 11 5 - 15  CBC     Status: None   Collection Time: 08/18/23  1:27 PM  Result Value Ref Range   WBC 5.1 4.0 - 10.5 K/uL   RBC 5.26 4.22 - 5.81 MIL/uL   Hemoglobin 16.3 13.0 - 17.0 g/dL   HCT 04.5 40.9 - 81.1 %   MCV 97.1 80.0 - 100.0 fL   MCH 31.0 26.0 - 34.0 pg   MCHC 31.9 30.0 - 36.0 g/dL   RDW 91.4 78.2 - 95.6 %   Platelets 281 150 - 400 K/uL   nRBC 0.0 0.0 - 0.2 %  Troponin I (High Sensitivity)     Status: Abnormal   Collection Time: 08/18/23  1:27 PM  Result Value Ref Range   Troponin I (High Sensitivity) 77 (H) <18 ng/L  Protime-INR     Status: Abnormal   Collection Time: 08/18/23  1:27 PM  Result Value Ref Range   Prothrombin Time 19.6 (H) 11.4 - 15.2 seconds   INR 1.6 (H) 0.8 - 1.2  Troponin I (High Sensitivity)     Status: Abnormal   Collection Time: 08/18/23  3:58 PM  Result Value Ref Range   Troponin I (High Sensitivity) 81 (H) <18 ng/L  Hepatic function panel     Status: None   Collection Time: 08/18/23  3:58 PM  Result Value Ref Range   Total Protein 7.3 6.5 - 8.1 g/dL   Albumin 3.5 3.5 - 5.0 g/dL   AST 31 15 - 41 U/L   ALT 29 0 - 44 U/L   Alkaline Phosphatase 55 38 - 126 U/L   Total Bilirubin 0.8 <1.2 mg/dL   Bilirubin, Direct 0.1 0.0 - 0.2 mg/dL   Indirect Bilirubin 0.7 0.3 - 0.9 mg/dL  Blood gas, venous     Status: Abnormal   Collection Time: 08/18/23  8:00 PM  Result Value Ref Range   pH, Ven 7.4 7.25 - 7.43   pCO2, Ven 41 (L) 44 - 60 mmHg   pO2, Ven 43 32 - 45 mmHg   Bicarbonate 25.4 20.0 - 28.0 mmol/L   Acid-Base Excess 0.5 0.0 - 2.0 mmol/L   O2 Saturation 74.2 %   Patient temperature 37.0    Collection site VEIN   Urine  Drug Screen, Qualitative ([facility] only)     Status: None   Collection Time: 08/18/23  8:05 PM  Result Value Ref Range   Tricyclic, Ur Screen NONE DETECTED NONE DETECTED   Amphetamines, Ur Screen NONE DETECTED NONE DETECTED   MDMA (Ecstasy)Ur Screen NONE DETECTED NONE DETECTED   Cocaine Metabolite,Ur [place] NONE DETECTED NONE DETECTED   Opiate, Ur Screen NONE DETECTED NONE DETECTED   Phencyclidine (PCP) Ur S NONE DETECTED NONE DETECTED   Cannabinoid 50 Ng, Ur [place] NONE DETECTED NONE DETECTED   Barbiturates, Ur Screen NONE DETECTED NONE DETECTED   Benzodiazepine, Ur Scrn NONE DETECTED NONE DETECTED  Methadone Scn, Ur NONE DETECTED NONE DETECTED  Lactic acid, plasma     Status: None   Collection Time: 08/18/23  8:05 PM  Result Value Ref Range   Lactic Acid, Venous 1.4 0.5 - 1.9 mmol/L  APTT     Status: None   Collection Time: 08/18/23  8:05 PM  Result Value Ref Range   aPTT 27 24 - 36 seconds  CBC     Status: None   Collection Time: 08/19/23  2:13 AM  Result Value Ref Range   WBC 5.1 4.0 - 10.5 K/uL   RBC 4.87 4.22 - 5.81 MIL/uL   Hemoglobin 15.2 13.0 - 17.0 g/dL   HCT 93.2 35.5 - 73.2 %   MCV 96.7 80.0 - 100.0 fL   MCH 31.2 26.0 - 34.0 pg   MCHC 32.3 30.0 - 36.0 g/dL   RDW 20.2 54.2 - 70.6 %   Platelets 244 150 - 400 K/uL   nRBC 0.0 0.0 - 0.2 %  Heparin level (unfractionated)     Status: None   Collection Time: 08/19/23  2:13 AM  Result Value Ref Range   Heparin Unfractionated 0.64 0.30 - 0.70 IU/mL    CBC: Recent Labs  Lab 08/18/23 1327 08/19/23 0213  WBC 5.1 5.1  HGB 16.3 15.2  HCT 51.1 47.1  MCV 97.1 96.7  PLT 281 244   Basic Metabolic Panel: Recent Labs  Lab 08/18/23 1327  NA 138  K 4.0  CL 104  CO2 23  GLUCOSE 118*  BUN 18  CREATININE 1.60*  CALCIUM 9.2   GFR: Estimated Creatinine Clearance: 77.3 mL/min (A) (by C-G formula based on SCr of 1.6 mg/dL (H)). Liver Function Tests: Recent Labs  Lab 08/18/23 1558  AST 31  ALT 29  ALKPHOS 55  BILITOT 0.8   PROT 7.3  ALBUMIN 3.5   No results for input(s): "LIPASE", "AMYLASE" in the last 168 hours. No results for input(s): "AMMONIA" in the last 168 hours. Coagulation Profile: Recent Labs  Lab 08/18/23 1327  INR 1.6*   Cardiac Enzymes: No results for input(s): "CKTOTAL", "CKMB", "CKMBINDEX", "TROPONINI" in the last 168 hours. BNP (last 3 results) No results for input(s): "PROBNP" in the last 8760 hours. HbA1C: No results for input(s): "HGBA1C" in the last 72 hours. CBG: No results for input(s): "GLUCAP" in the last 168 hours. Lipid Profile: No results for input(s): "CHOL", "HDL", "LDLCALC", "TRIG", "CHOLHDL", "LDLDIRECT" in the last 72 hours. Thyroid Function Tests: No results for input(s): "TSH", "T4TOTAL", "FREET4", "T3FREE", "THYROIDAB" in the last 72 hours. Anemia Panel: No results for input(s): "VITAMINB12", "FOLATE", "FERRITIN", "TIBC", "IRON", "RETICCTPCT" in the last 72 hours. Urinalysis    Component Value Date/Time   COLORURINE YELLOW 08/18/2023 1008   APPEARANCEUR CLEAR 08/18/2023 1008   APPEARANCEUR Clear 03/18/2022 1436   LABSPEC >=1.030 (A) 08/18/2023 1008   LABSPEC 1.012 12/25/2014 1655   PHURINE 6.0 08/18/2023 1008   GLUCOSEU NEGATIVE 08/18/2023 1008   HGBUR NEGATIVE 08/18/2023 1008   BILIRUBINUR NEGATIVE 08/18/2023 1008   BILIRUBINUR Negative 03/18/2022 1436   BILIRUBINUR Negative 12/25/2014 1655   KETONESUR NEGATIVE 08/18/2023 1008   PROTEINUR >=300 (A) 06/14/2023 0827   UROBILINOGEN 0.2 08/18/2023 1008   NITRITE NEGATIVE 08/18/2023 1008   LEUKOCYTESUR NEGATIVE 08/18/2023 1008   LEUKOCYTESUR Negative 12/25/2014 1655   Unresulted Labs (From admission, onward)     Start     Ordered   08/19/23 0500  Comprehensive metabolic panel  Tomorrow morning,   R  08/18/23 2000   08/19/23 0241  Digoxin level  Once,   R        08/19/23 0241   08/19/23 0224  T4, free  Add-on,   AD        08/19/23 0223   08/19/23 0224  TSH  Add-on,   AD        08/19/23 0223    08/19/23 0224  Procalcitonin  Add-on,   AD       References:    Procalcitonin Lower Respiratory Tract Infection AND Sepsis Procalcitonin Algorithm   08/19/23 0223   08/19/23 0224  Brain natriuretic peptide  Once,   R        08/19/23 0223            Medications  sodium chloride flush (NS) 0.9 % injection 3 mL (3 mLs Intravenous Given 08/18/23 2118)  acetaminophen (TYLENOL) tablet 650 mg (has no administration in time range)    Or  acetaminophen (TYLENOL) suppository 650 mg (has no administration in time range)  HYDROcodone-acetaminophen (NORCO/VICODIN) 5-325 MG per tablet 1 tablet (has no administration in time range)  morphine (PF) 2 MG/ML injection 2 mg (has no administration in time range)  aspirin EC tablet 81 mg (81 mg Oral Given 08/18/23 2111)  heparin ADULT infusion 100 units/mL (25000 units/216mL) (1,650 Units/hr Intravenous New Bag/Given 08/18/23 2110)  albuterol (PROVENTIL) (2.5 MG/3ML) 0.083% nebulizer solution 2.5 mg (has no administration in time range)  digoxin (LANOXIN) tablet 0.0625 mg (has no administration in time range)  metoprolol succinate (TOPROL-XL) 24 hr tablet 50 mg (has no administration in time range)  spironolactone (ALDACTONE) tablet 25 mg (has no administration in time range)  iohexol (OMNIPAQUE) 350 MG/ML injection 100 mL (100 mLs Intravenous Contrast Given 08/18/23 1624)  heparin bolus via infusion 6,000 Units (6,000 Units Intravenous Bolus from Bag 08/18/23 2111)    Radiological Exams on Admission: CT Angio Chest PE W and/or Wo Contrast Result Date: 08/18/2023 CLINICAL DATA:  Follow up chronic PE.  SOB and chest pain. EXAM: CT ANGIOGRAPHY CHEST WITH CONTRAST TECHNIQUE: Multidetector CT imaging of the chest was performed using the standard protocol during bolus administration of intravenous contrast. Multiplanar CT image reconstructions and MIPs were obtained to evaluate the vascular anatomy. RADIATION DOSE REDUCTION: This exam was performed according  to the departmental dose-optimization program which includes automated exposure control, adjustment of the mA and/or kV according to patient size and/or use of iterative reconstruction technique. CONTRAST:  OMNIPAQUE IOHEXOL 350 MG/ML SOLN COMPARISON:  06/14/2023. FINDINGS: Cardiovascular: Stable large chronic nonocclusive right sided pulmonary embolism. Left-sided PE present previously appears to have partially resolved. Evidence of right heart strain and tricuspid regurgitation. No new PE. No pericardial effusion. There cardiomegaly. No aortic aneurysm. Mediastinum/Nodes: No enlarged mediastinal, hilar, or axillary lymph nodes. Thyroid gland, trachea, and esophagus demonstrate no significant findings. Lungs/Pleura: Lungs are clear. No pleural effusion or pneumothorax. Upper Abdomen: No acute abnormality.  Suprarenal IVC filter noted. Musculoskeletal: Thoracic degenerative changes. Review of the MIP images confirms the above findings. IMPRESSION: 1. Stable chronic right-sided PE. Left-sided PE present previously has resolved. 2. Right heart strain and tricuspid regurgitation. 3. Suprarenal IVC filter. Electronically Signed   By: [name] M.D.   On: 08/18/2023 17:27   DG Chest 2 View Result Date: 08/18/2023 CLINICAL DATA:  cp, sob EXAM: CHEST - 2 VIEW COMPARISON:  07/19/2023. FINDINGS: The heart size and mediastinal contours are within normal limits. Both lungs are clear. No pneumothorax or pleural effusion. There  are thoracic degenerative changes. IMPRESSION: No acute cardiopulmonary disease. Electronically Signed   By: [name] M.D.   On: 08/18/2023 17:21     Data Reviewed: Relevant notes from primary care and specialist visits, past discharge summaries as available in EHR, including Care Everywhere. Prior diagnostic testing as pertinent to current admission diagnoses Updated medications and problem lists for reconciliation ED course, including vitals, labs, imaging, treatment and  response to treatment Triage notes, nursing and pharmacy notes and ED provider's notes Notable results as noted in HPI  Assessment and Plan: Morbid obesity with BMI of 50.0-59.9, adult (HCC) D/w pt briefly about weight and risk for PE.  Due to chronic and recurrent and multiple PE with recent PE will start patient on heparin drip right now.  Chronic diastolic CHF (congestive heart failure) (HCC) Patient is currently mild to moderate clinical presentation for CHF.  He is not overtly volume overloaded but definitely has peripheral edema and no shortness of breath or JVD or rales on exam.  History of pulmonary embolism and DVT Heparin gtt per pharmacy protocol.  Pulmonary consult for Right heart strain and options. D/w pt about using cpap and being complaint. F/u with hematologist.  F/u with cardiology.   Type 2 diabetes mellitus with diabetic chronic kidney disease (HCC) Sliding scale insulin regimen.    Essential hypertension Vitals:   08/18/23 1323 08/18/23 1600 08/18/23 1811 08/18/23 2115  BP: (!) 148/100 116/85 (!) 130/100 129/86   08/18/23 2130 08/18/23 2200 08/19/23 0000 08/19/23 0030  BP: 127/82 (!) 126/92 125/81 131/87   08/19/23 0130 08/19/23 0200 08/19/23 0230  BP: (!) 122/94 (!) 137/91 (!) 141/99  Cont metoprolol and aldactone.    CKD (chronic kidney disease), stage IIIa Lab Results  Component Value Date   CREATININE 1.60 (H) 08/18/2023   CREATININE 1.32 (H) 07/19/2023   CREATININE 1.36 (H) 07/04/2023  Stable avoid contrast. Renally dose meds.     SOB (shortness of breath) 2/2 to PE and pulmonary Htn . Diurese on PRN basis. Supplemental oxygen.   Paroxysmal atrial fibrillation (HCC) Currently in SR and heparin gtt.   Current use of long term anticoagulation Changed warfarin to heparin.  May need heparin shots at home upon discharge to ensure effectiveness of regimen .  Pt has failed xarelto and eliquis therapy in past.    Prognosis: Fair   DVT  prophylaxis:  Heparin   Consults:  Pulmonary. Cardiology.   Advance Care Planning:    Code Status: Full Code   Family Communication:  None   Disposition Plan:  Home   Severity of Illness: The appropriate patient status for this patient is INPATIENT. Inpatient status is judged to be reasonable and necessary in order to provide the required intensity of service to ensure the patient's safety. The patient's presenting symptoms, physical exam findings, and initial radiographic and laboratory data in the context of their chronic comorbidities is felt to place them at high risk for further clinical deterioration. Furthermore, it is not anticipated that the patient will be medically stable for discharge from the hospital within 2 midnights of admission.   * I certify that at the point of admission it is my clinical judgment that the patient will require inpatient hospital care spanning beyond 2 midnights from the point of admission due to high intensity of service, high risk for further deterioration and high frequency of surveillance required.*  Author: Gertha Calkin, MD 08/19/2023 3:12 AM  For on call review [email or web address].  Orders Placed This Encounter  Procedures   DG Chest 2 View   CT Angio Chest PE W and/or Wo Contrast   Basic metabolic panel   CBC   Protime-INR   Hepatic function panel   Urine Drug Screen, Qualitative ([facility] only)   Blood gas, venous   Lactic acid, plasma   APTT   T4, free   TSH   Procalcitonin   Brain natriuretic peptide   Comprehensive metabolic panel   CBC   Heparin level (unfractionated)   Digoxin level   Diet full liquid Room service appropriate? Yes; Fluid consistency: Thin   Document Height and Actual Weight   Maintain IV access   Vital signs   Notify physician (specify)   Mobility Protocol: No Restrictions   Refer to Sidebar Report Refer to ICU, Med-Surg, Progressive, and Step-Down Mobility Protocol Sidebars   Daily weights   Intake and  Output   Do not place and if present remove PureWick   Initiate Oral Care Protocol   Initiate Carrier Fluid Protocol   RN may order General Admission PRN Orders utilizing "General Admission PRN medications" (through manage orders) for the following patient needs: allergy symptoms (Claritin), cold sores (Carmex), cough (Robitussin DM), eye irritation (Liquifilm Tears), hemorrhoids (Tucks), indigestion (Maalox), minor skin irritation (Hydrocortisone Cream), muscle pain Romeo Apple Gay), nose irritation (saline nasal spray) and sore throat (Chloraseptic spray).   Cardiac Monitoring - Continuous Indefinite   Strict intake and output   Full code   Consult to hospitalist   heparin per pharmacy consult   Inpatient consult to Cardiology [facility] only - Select consulting group: [facility]; Consult Timeframe: ROUTINE - requires response within 24 hours; Reason for Consult? Shortness of breath chest pain.   Electrolytes Per Pharmacy Consult [facility] Only)   Consult to pulmonology Consult Timeframe: ROUTINE - requires response within 24 hours; Reason for Consult? SOB/ Chest pain/ Complicated case Not sure what we can offer for his c/h pe and Increased right heart strain and overall not sure about opti...   Pulse oximetry check with vital signs   Oxygen therapy Mode or (Route): Nasal cannula; Liters Per Minute: 2; Keep O2 saturation between: greater than 92 %   Pulse oximetry, continuous   EKG 12-Lead   ED EKG   Saline lock IV   Admit to Inpatient (patient's expected length of stay will be greater than 2 midnights or inpatient only procedure)   Aspiration precautions   Fall precautions

## 2023-08-18 NOTE — Patient Instructions (Addendum)
Your instructions for increased use of furosemide are as follows:    If you gain 2 lbs overnight,  or 5 lbs over a week increase your furosemide to DAILY,  and if you do not lose weight  overnight,  DOUBLE YOUR DOSE UNTIL YOU START LOSING  WEIGHT   Get your sleep study rescheduled ASAP.  Take furosemide the MORNING OF YOUR STUDY SO YOU CAN GET GOOD RESULTS  INCREASE MOUNJARO TO 7,5 MG WEEKLY.  LET ME KNOW IF YOU DO NOT TOLERATE THIS DOSE   REFERRAL TO NEPHROLOGY IN PROCESS

## 2023-08-18 NOTE — ED Notes (Signed)
Pt given urinal.

## 2023-08-18 NOTE — ED Notes (Signed)
Patient reports wearing oxygen (2L [place] at home while asleep), and upon arrival to [facility], had increased work of breathing after bed transfer. With this, this RN placed patient on 2L [place] of oxygen to aid in breathing. Pt tolerated well.

## 2023-08-19 DIAGNOSIS — I50810 Right heart failure, unspecified: Secondary | ICD-10-CM

## 2023-08-19 DIAGNOSIS — J9621 Acute and chronic respiratory failure with hypoxia: Secondary | ICD-10-CM

## 2023-08-19 LAB — COMPREHENSIVE METABOLIC PANEL
ALT: 24 U/L (ref 0–44)
AST: 29 U/L (ref 15–41)
Albumin: 3.5 g/dL (ref 3.5–5.0)
Alkaline Phosphatase: 56 U/L (ref 38–126)
Anion gap: 9 (ref 5–15)
BUN: 18 mg/dL (ref 6–20)
CO2: 24 mmol/L (ref 22–32)
Calcium: 8.6 mg/dL — ABNORMAL LOW (ref 8.9–10.3)
Chloride: 104 mmol/L (ref 98–111)
Creatinine, Ser: 1.41 mg/dL — ABNORMAL HIGH (ref 0.61–1.24)
GFR, Estimated: 60 mL/min — ABNORMAL LOW (ref >60)
Glucose, Bld: 100 mg/dL — ABNORMAL HIGH (ref 70–99)
Potassium: 4 mmol/L (ref 3.5–5.1)
Sodium: 137 mmol/L (ref 135–145)
Total Bilirubin: 1.7 mg/dL — ABNORMAL HIGH (ref <1.2)
Total Protein: 7.4 g/dL (ref 6.5–8.1)

## 2023-08-19 LAB — HEPARIN LEVEL (UNFRACTIONATED)
Heparin Unfractionated: 0.64 [IU]/mL (ref 0.30–0.70)
Heparin Unfractionated: 0.45 [IU]/mL (ref 0.30–0.70)

## 2023-08-19 LAB — CBC
HCT: 47.1 % (ref 39.0–52.0)
Hemoglobin: 15.2 g/dL (ref 13.0–17.0)
MCH: 31.2 pg (ref 26.0–34.0)
MCHC: 32.3 g/dL (ref 30.0–36.0)
MCV: 96.7 fL (ref 80.0–100.0)
Platelets: 244 10*3/uL (ref 150–400)
RBC: 4.87 MIL/uL (ref 4.22–5.81)
RDW: 14.5 % (ref 11.5–15.5)
WBC: 5.1 10*3/uL (ref 4.0–10.5)
nRBC: 0 % (ref 0.0–0.2)

## 2023-08-19 LAB — T4, FREE: Free T4: 1.12 ng/dL (ref 0.61–1.12)

## 2023-08-19 LAB — PROCALCITONIN: Procalcitonin: <0.1 ng/mL

## 2023-08-19 LAB — DIGOXIN LEVEL: Digoxin Level: <0.2 ng/mL — ABNORMAL LOW (ref 0.8–2.0)

## 2023-08-19 LAB — BRAIN NATRIURETIC PEPTIDE: B Natriuretic Peptide: 267.8 pg/mL — ABNORMAL HIGH (ref 0.0–100.0)

## 2023-08-19 LAB — TSH: TSH: 3.471 u[IU]/mL (ref 0.350–4.500)

## 2023-08-19 LAB — MAGNESIUM: Magnesium: 1.9 mg/dL (ref 1.7–2.4)

## 2023-08-19 LAB — PHOSPHORUS: Phosphorus: 3.9 mg/dL (ref 2.5–4.6)

## 2023-08-19 MED ORDER — TRAZODONE HCL 50 MG PO TABS: 50.0000 mg | ORAL_TABLET | Freq: Every evening | ORAL | Status: DC | PRN

## 2023-08-19 MED ORDER — HYDRALAZINE HCL 20 MG/ML IJ SOLN: 10.0000 mg | INTRAMUSCULAR | Status: DC | PRN

## 2023-08-19 MED ORDER — WARFARIN - PHARMACIST DOSING INPATIENT
Freq: Every day | Status: DC
  Filled 2023-08-19: qty 1

## 2023-08-19 MED ORDER — ONDANSETRON HCL 4 MG/2ML IJ SOLN: 4.0000 mg | Freq: Four times a day (QID) | INTRAMUSCULAR | Status: DC | PRN

## 2023-08-19 MED ORDER — GUAIFENESIN 100 MG/5ML PO LIQD: 5.0000 mL | ORAL | Status: DC | PRN

## 2023-08-19 MED ORDER — SPIRONOLACTONE 25 MG PO TABS
25.0000 mg | ORAL_TABLET | Freq: Every day | ORAL | Status: DC
  Administered 2023-08-19 – 2023-08-23 (×5): 25 mg via ORAL
  Filled 2023-08-19 (×5): qty 1

## 2023-08-19 MED ORDER — METOPROLOL SUCCINATE ER 50 MG PO TB24
50.0000 mg | ORAL_TABLET | Freq: Every day | ORAL | Status: DC
Start: 2023-08-19 — End: 2023-08-22
  Administered 2023-08-19 – 2023-08-21 (×3): 50 mg via ORAL
  Filled 2023-08-19 (×4): qty 1

## 2023-08-19 MED ORDER — DIGOXIN 125 MCG PO TABS
0.0625 mg | ORAL_TABLET | Freq: Every day | ORAL | Status: DC
  Administered 2023-08-19 – 2023-08-23 (×5): 0.0625 mg via ORAL
  Filled 2023-08-19 (×5): qty 0.5

## 2023-08-19 MED ORDER — SENNOSIDES-DOCUSATE SODIUM 8.6-50 MG PO TABS: 1.0000 | ORAL_TABLET | Freq: Every evening | ORAL | Status: DC | PRN

## 2023-08-19 MED ORDER — IPRATROPIUM-ALBUTEROL 0.5-2.5 (3) MG/3ML IN SOLN: 3.0000 mL | RESPIRATORY_TRACT | Status: DC | PRN

## 2023-08-19 MED ORDER — ALBUTEROL SULFATE (2.5 MG/3ML) 0.083% IN NEBU: 2.5000 mg | INHALATION_SOLUTION | Freq: Four times a day (QID) | RESPIRATORY_TRACT | Status: DC | PRN

## 2023-08-19 MED ORDER — WARFARIN SODIUM 7.5 MG PO TABS
7.5000 mg | ORAL_TABLET | Freq: Once | ORAL | Status: AC
Start: 2023-08-19 — End: 2023-08-19
  Administered 2023-08-19: 7.5 mg via ORAL
  Filled 2023-08-19: qty 1

## 2023-08-19 MED ORDER — FUROSEMIDE 10 MG/ML IJ SOLN
80.0000 mg | Freq: Two times a day (BID) | INTRAMUSCULAR | Status: DC
Start: 2023-08-19 — End: 2023-08-20
  Administered 2023-08-19 – 2023-08-20 (×3): 80 mg via INTRAVENOUS
  Filled 2023-08-19 (×3): qty 8

## 2023-08-19 MED ORDER — METOPROLOL TARTRATE 5 MG/5ML IV SOLN: 5.0000 mg | INTRAVENOUS | Status: DC | PRN

## 2023-08-19 MED ORDER — LOSARTAN POTASSIUM 25 MG PO TABS
25.0000 mg | ORAL_TABLET | Freq: Every day | ORAL | Status: DC
  Administered 2023-08-19 – 2023-08-23 (×5): 25 mg via ORAL
  Filled 2023-08-19 (×5): qty 1

## 2023-08-19 NOTE — Progress Notes (Signed)
PROGRESS NOTE    POET STUTE  QIO:[id number] DOB: Aug 10, 1971 DOA: 08/18/2023 PCP: Sherlene Shams, MD    Brief Narrative:  52 year old with history of morbid obesity, chronic lower extremity wounds, CHF with preserved EF, DM2, PE/DVT status post IVC on Coumadin since April 2024, subtherapeutic INR, failed Eliquis and Xarelto in the past, admitted for shortness of breath and chest pain.   Assessment & Plan:  Active Problems:   Morbid obesity with BMI of 50.0-59.9, adult (HCC)   Chronic diastolic CHF (congestive heart failure) (HCC)   History of pulmonary embolism and DVT   Essential hypertension   Type 2 diabetes mellitus with diabetic chronic kidney disease (HCC)   CKD (chronic kidney disease), stage IIIa   Current use of long term anticoagulation   Paroxysmal atrial fibrillation (HCC)   SOB (shortness of breath)   Acute respiratory distress; multifactorial -Secondary to morbid obesity and poor compliance.  I suspect he has some form of underlying PAH leading to right-sided heart issues.  I think patient will benefit from right heart cath -Troponins are flat, procalcitonin negative.  Diabetes mellitus type 2 -Last A1c 6.1.  Prediabetic range.  Does not appear to be any medications  History of pulmonary embolism/DVT status post IVC filter Chronic anticoagulation - CTA chest shows left-sided PE has resolved, on the right side has nonocclusive chronic PE which appears to be stable.  Continue Coumadin outpatient.  Currently on heparin drip.  Essential hypertension History of paroxysmal A-fib -On metoprolol, Aldactone, digoxin.  IV as needed  Morbid obesity with BMI greater than 50 -Needs to follow-up outpatient PCP and possible consider bariatric surgery  Congestive heart failure with preserved EF -Metoprolol, Aldactone.  Preserved EF but does have reduced RV function with TR  CKD stage IIIa -Cr is stable at 1.3   DVT prophylaxis: Heparin drip Code Status: Full Family  Communication:   Status is: Inpatient Remains inpatient appropriate because: Continue hospital stay to help further evaluate his acute respiratory distress.  Appears to be multifactorial in nature.    Subjective:  Seen at bedside no complaints.  Feels already better compared to yesterday.  Examination:  General exam: Appears calm and comfortable, morbid obesity Respiratory system: Clear to auscultation. Respiratory effort normal. Cardiovascular system: S1 & S2 heard, RRR. No JVD, murmurs, rubs, gallops or clicks. No pedal edema. Gastrointestinal system: Abdomen is nondistended, soft and nontender. No organomegaly or masses felt. Normal bowel sounds heard. Central nervous system: Alert and oriented. No focal neurological deficits. Extremities: Symmetric 5 x 5 power. Skin: Bilateral lower extremity chronic skin changes Psychiatry: Judgement and insight appear normal. Mood & affect appropriate.                Diet Orders (From admission, onward)     Start     Ordered   08/18/23 1959  Diet full liquid Room service appropriate? Yes; Fluid consistency: Thin  Diet effective now       Question Answer Comment  Room service appropriate? Yes   Fluid consistency: Thin      08/18/23 2000            Objective: Vitals:   08/19/23 0830 08/19/23 0900 08/19/23 0930 08/19/23 1000  BP: (!) 142/91 (!) 147/105 (!) 144/108 (!) 142/107  Pulse: 72 73 70 70  Resp:   19   Temp:   98.1 F (36.7 C)   TempSrc:   Oral   SpO2: 96% 95% 95% 94%  Weight:  Height:        Intake/Output Summary (Last 24 hours) at 08/19/2023 1247 Last data filed at 08/19/2023 0949 Gross per 24 hour  Intake --  Output 400 ml  Net -400 ml   Filed Weights   08/18/23 1323  Weight: (!) 156.5 kg    Scheduled Meds:  aspirin EC  81 mg Oral Daily   digoxin  0.0625 mg Oral Daily   furosemide  80 mg Intravenous BID   losartan  25 mg Oral Daily   metoprolol succinate  50 mg Oral Daily   sodium  chloride flush  3 mL Intravenous Q12H   spironolactone  25 mg Oral Daily   Continuous Infusions:  heparin 1,650 Units/hr (08/19/23 0855)    Nutritional status     Body mass index is 54.04 kg/m.  Data Reviewed:   CBC: Recent Labs  Lab 08/18/23 1327 08/19/23 0213  WBC 5.1 5.1  HGB 16.3 15.2  HCT 51.1 47.1  MCV 97.1 96.7  PLT 281 244   Basic Metabolic Panel: Recent Labs  Lab 08/18/23 1327 08/19/23 0213 08/19/23 0457  NA 138 137  --   K 4.0 4.0  --   CL 104 104  --   CO2 23 24  --   GLUCOSE 118* 100*  --   BUN 18 18  --   CREATININE 1.60* 1.41*  --   CALCIUM 9.2 8.6*  --   MG  --   --  1.9  PHOS  --   --  3.9   GFR: Estimated Creatinine Clearance: 87.7 mL/min (A) (by C-G formula based on SCr of 1.41 mg/dL (H)). Liver Function Tests: Recent Labs  Lab 08/18/23 1558 08/19/23 0213  AST 31 29  ALT 29 24  ALKPHOS 55 56  BILITOT 0.8 1.7*  PROT 7.3 7.4  ALBUMIN 3.5 3.5   No results for input(s): "LIPASE", "AMYLASE" in the last 168 hours. No results for input(s): "AMMONIA" in the last 168 hours. Coagulation Profile: Recent Labs  Lab 08/18/23 1327  INR 1.6*   Cardiac Enzymes: No results for input(s): "CKTOTAL", "CKMB", "CKMBINDEX", "TROPONINI" in the last 168 hours. BNP (last 3 results) No results for input(s): "PROBNP" in the last 8760 hours. HbA1C: No results for input(s): "HGBA1C" in the last 72 hours. CBG: No results for input(s): "GLUCAP" in the last 168 hours. Lipid Profile: No results for input(s): "CHOL", "HDL", "LDLCALC", "TRIG", "CHOLHDL", "LDLDIRECT" in the last 72 hours. Thyroid Function Tests: Recent Labs    08/19/23 0213  TSH 3.471  FREET4 1.12   Anemia Panel: No results for input(s): "VITAMINB12", "FOLATE", "FERRITIN", "TIBC", "IRON", "RETICCTPCT" in the last 72 hours. Sepsis Labs: Recent Labs  Lab 08/18/23 2005 08/19/23 2956  PROCALCITON  --  <0.10  LATICACIDVEN 1.4  --     No results found for this or any previous visit  (from the past 240 hours).       Radiology Studies: CT Angio Chest PE W and/or Wo Contrast Result Date: 08/18/2023 CLINICAL DATA:  Follow up chronic PE.  SOB and chest pain. EXAM: CT ANGIOGRAPHY CHEST WITH CONTRAST TECHNIQUE: Multidetector CT imaging of the chest was performed using the standard protocol during bolus administration of intravenous contrast. Multiplanar CT image reconstructions and MIPs were obtained to evaluate the vascular anatomy. RADIATION DOSE REDUCTION: This exam was performed according to the departmental dose-optimization program which includes automated exposure control, adjustment of the mA and/or kV according to patient size and/or use of iterative reconstruction technique. CONTRAST:  OMNIPAQUE IOHEXOL 350 MG/ML SOLN COMPARISON:  06/14/2023. FINDINGS: Cardiovascular: Stable large chronic nonocclusive right sided pulmonary embolism. Left-sided PE present previously appears to have partially resolved. Evidence of right heart strain and tricuspid regurgitation. No new PE. No pericardial effusion. There cardiomegaly. No aortic aneurysm. Mediastinum/Nodes: No enlarged mediastinal, hilar, or axillary lymph nodes. Thyroid gland, trachea, and esophagus demonstrate no significant findings. Lungs/Pleura: Lungs are clear. No pleural effusion or pneumothorax. Upper Abdomen: No acute abnormality.  Suprarenal IVC filter noted. Musculoskeletal: Thoracic degenerative changes. Review of the MIP images confirms the above findings. IMPRESSION: 1. Stable chronic right-sided PE. Left-sided PE present previously has resolved. 2. Right heart strain and tricuspid regurgitation. 3. Suprarenal IVC filter. Electronically Signed   By: [name] M.D.   On: 08/18/2023 17:27   DG Chest 2 View Result Date: 08/18/2023 CLINICAL DATA:  cp, sob EXAM: CHEST - 2 VIEW COMPARISON:  07/19/2023. FINDINGS: The heart size and mediastinal contours are within normal limits. Both lungs are clear. No  pneumothorax or pleural effusion. There are thoracic degenerative changes. IMPRESSION: No acute cardiopulmonary disease. Electronically Signed   By: [name] M.D.   On: 08/18/2023 17:21           LOS: 1 day   Time spent= 35 mins    Miguel Rota, MD [facility]  If 7PM-7AM, please contact night-coverage  08/19/2023, 12:47 PM

## 2023-08-19 NOTE — Assessment & Plan Note (Signed)
Lab Results  Component Value Date   CREATININE 1.60 (H) 08/18/2023   CREATININE 1.32 (H) 07/19/2023   CREATININE 1.36 (H) 07/04/2023  Stable avoid contrast. Renally dose meds.

## 2023-08-19 NOTE — Progress Notes (Signed)
PHARMACY - ANTICOAGULATION CONSULT NOTE  Pharmacy Consult for Heparin infusion Indication:  PE/A.FIb/ DVT/ IVC filter and SVC filter  Allergies  Allergen Reactions   Metrizamide Other (See Comments)    Kidney failure requiring dialysis Kidney failure requiring dialysis    Clindamycin/Lincomycin Rash   Lincomycin Rash   Sulfa Antibiotics Rash    Patient Measurements: Height: 5\' 7"  (170.2 cm) Weight: (!) 156.5 kg (345 lb 0.3 oz) IBW/kg (Calculated) : 66.1 Heparin Dosing Weight: 104.8 kg  Vital Signs: Temp: 97.6 F (36.4 C) (12/19 2353) Temp Source: Axillary (12/19 2353) BP: 141/99 (12/20 0230) Pulse Rate: 67 (12/20 0245)  Labs: Recent Labs    08/18/23 1327 08/18/23 1558 08/18/23 2005 08/19/23 0213  HGB 16.3  --   --  15.2  HCT 51.1  --   --  47.1  PLT 281  --   --  244  APTT  --   --  27  --   LABPROT 19.6*  --   --   --   INR 1.6*  --   --   --   HEPARINUNFRC  --   --   --  0.64  CREATININE 1.60*  --   --   --   TROPONINIHS 77* 81*  --   --     Estimated Creatinine Clearance: 77.3 mL/min (A) (by C-G formula based on SCr of 1.6 mg/dL (H)).   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  Regular the workup has not been forthcoming) previously [facility] Isaiah Serge [facility]   Hepatic steatosis    Hypertension    Morbid obesity with BMI of 50.0-59.9, adult (HCC)    Oxygen deficiency    Peripheral vascular disease (HCC)    Presence of IVC filter    Has 2 IVC filters with significant thrombus burden superior to the filter.   Pulmonary embolus (HCC) 12/2016    Initially just treated with anticoagulation, but in February 2023 in April 2024 treated with EKOS thrombectomy for submassive PE.   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 On admission, patient's INR was subtherapeutic at 1.6 (INR goal per Anticoagulation clinic note is 2.5-3.5). Home warfarin dose appears to be warfarin 5 mg daily as of 07/27/23 Anticoagulation Clinic note (patient's med rec is pending). At this time, patient had a therapeutic INR of 2.8 on this regimen. Last dose of warfarin was 12/18 PM. Pharmacy was consulted to transition patient to a heparin infusion for PE. CT Chest showed: Stable chronic right-sided PE. Left-sided PE present previously has resolved.  Baseline INR 1.6, aPTT ordered.  No signs/symptoms of bleeding noted in chart. Hgb 16.3. PLT 281.  Goal of Therapy:  Heparin level 0.3-0.7 units/ml Monitor platelets by anticoagulation protocol: Yes   Plan:  12/20:  HL @ 0213 = 0.64, therapeutic X 1 - Will continue pt on current rate and recheck HL in 6 hrs.  Monitor CBC daily   Bryanah Sidell D, PharmD Clinical Pharmacist 08/19/2023,3:18 AM

## 2023-08-19 NOTE — Assessment & Plan Note (Signed)
Heparin gtt per pharmacy protocol.  Pulmonary consult for Right heart strain and options. D/w pt about using cpap and being complaint. F/u with hematologist.  F/u with cardiology.

## 2023-08-19 NOTE — Assessment & Plan Note (Signed)
Currently in SR and heparin gtt.

## 2023-08-19 NOTE — ED Notes (Signed)
Chief Technology Officer and personal request to Dr Allena Katz to release pt med rec

## 2023-08-19 NOTE — Assessment & Plan Note (Signed)
 Sliding scale insulin regimen.

## 2023-08-19 NOTE — ED Notes (Signed)
Per [facility] pharmacy pt current heparin dose of 1650units/hr to continue recheck heparin blood draw as ordered @ 0800

## 2023-08-19 NOTE — Hospital Course (Addendum)
Brief Narrative:  52 year old with history of morbid obesity, chronic lower extremity wounds, CHF with preserved EF, DM2, PE/DVT status post IVC on Coumadin since April 2024, subtherapeutic INR, failed Eliquis and Xarelto in the past, admitted for shortness of breath and chest pain.   Assessment & Plan:  Active Problems:   Morbid obesity with BMI of 50.0-59.9, adult (HCC)   Chronic diastolic CHF (congestive heart failure) (HCC)   History of pulmonary embolism and DVT   Essential hypertension   Type 2 diabetes mellitus with diabetic chronic kidney disease (HCC)   CKD (chronic kidney disease), stage IIIa   Current use of long term anticoagulation   Paroxysmal atrial fibrillation (HCC)   SOB (shortness of breath)   Acute respiratory distress; multifactorial -Secondary to morbid obesity and poor compliance.  I suspect he has some form of underlying PAH leading to right-sided heart issues.  I think patient will benefit from right heart cath -Troponins are flat, procalcitonin negative.  Acute on chronic congestive heart failure with preserved EF with right-sided ventricular failure -Metoprolol, Aldactone.  Preserved EF but does have reduced RV function with TR.  Diuretics Lasix 80 mg IV twice daily  Diabetes mellitus type 2 -Last A1c 6.1.  Prediabetic range.  Does not appear to be any medications  History of pulmonary embolism/DVT status post IVC filter Chronic anticoagulation - CTA chest shows left-sided PE has resolved, on the right side has nonocclusive chronic PE which appears to be stable.  Continue Coumadin outpatient.  Currently on heparin drip.  Essential hypertension History of paroxysmal A-fib -On metoprolol, Aldactone, digoxin.  IV as needed  Morbid obesity with BMI greater than 50 -Needs to follow-up outpatient PCP and possible consider bariatric surgery    CKD stage IIIa -Cr is stable at 1.3   DVT prophylaxis: Heparin drip Code Status: Full Family Communication:    Status is: Inpatient Remains inpatient appropriate because: Continue hospital stay to help further evaluate his acute respiratory distress.  Appears to be multifactorial in nature.    Subjective:   Examination:  General exam: Appears calm and comfortable, morbid obesity Respiratory system: Clear to auscultation. Respiratory effort normal. Cardiovascular system: S1 & S2 heard, RRR. No JVD, murmurs, rubs, gallops or clicks. No pedal edema. Gastrointestinal system: Abdomen is nondistended, soft and nontender. No organomegaly or masses felt. Normal bowel sounds heard. Central nervous system: Alert and oriented. No focal neurological deficits. Extremities: Symmetric 5 x 5 power. Skin: Bilateral lower extremity chronic skin changes Psychiatry: Judgement and insight appear normal. Mood & affect appropriate.

## 2023-08-19 NOTE — Consult Note (Signed)
ADVANCED HEART FAILURE CONSULT NOTE  Referring Physician: No ref. 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    He has had multiple hospitalizations in the past several months. 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  He underwent mechanical thrombectomy with vascular surgery, and reportedly felt significantly improved at discharge.  He was discharged on warfarin and reports that he has been compliant with his medical therapy since that time.  He has been followed in wound care for a chronic left lower extremity ulcer, but had been told that it was improving.   Admitted in 10/24 when he began experiencing shortness of breath, cough, and new diarrhea and vomiting.  He presented to the hospital and was found to be febrile with an elevated lactate.  Vital signs were within normal limits, although oxygen requirement had increased.  He was admitted to the ICU for further management.  CTA showed persistent nonocclusive thrombus in the right main pulmonary artery as well as new small left upper lobe segmental PE. He has an extensive family history of clotting disorders and has been on Xarelto and warfarin in the past.  He reports that the men in his family are extremely prone to clotting. During that hospitalization he was diuresed with IV lasix, medications were optimized and he was dishcharged home.   "Follows with Hematology (Dr. Isaiah Serge), last seen in 2018. 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 Also strong family history of clotting. Work-up for hypercoagulability has been unrevealing thus far. Has had prior admissions for lytic therapy at [facility] in 11/2014 (when he had complete occlusion of the right common  femoral vein) and in 12/2016 (when he had a large PE in the right main pulmonary artery causing bilateral multiple segmental and subsegmental pulmonary arteries). 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 "   MTHFR gene mutation per history    - 32 (52 yo) - RLE DVT. Treated with warfarin.  - 1995 - LLE DVT, off warfarin.  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 S/p IVC filter x 2 (2006, 2012).  - 2016: Ilio-femoral DVT, treated with tPA. Thrombus extended along common iliac veins bilaterally and along IVC to 3.7 cm above the level of the IVC filter. Hospitalization c/b CIN/ARF requiring CRRT.  - 11/2016: PE in the setting of non-adherence to eliquis x 3 days. Treated with warfarin - 05/2017: Reported several interval VTE events. Negative thrombophelia eval (including prothrombin gene mutation).  - 09/2020: Admitted for PE  -11/2022: PE with thrombectomy -05/2023: PE with thrombectomy   Pos: Anticardiolipin IgG (29) Neg: ANA, ANCA, Lyme, RMSF, AntiCardiolipin IgM, Beta-2 glycoprotein, lupus anticoagulant, factor 5 leiden, Quantiferon, Hep C  Today Mr. Rosenberg presents with a 24-48H of worsening dyspnea. According to him he was feeling fairly well when he suddenly became short of breath. Due to concern for recurrent PE he presented again to [facility]. CTA PE here with PE and resolution of left sided PE from prior.     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  Regular the workup has not been forthcoming)  previously followed by Dr. Isaiah Serge at [facility]   Hepatic steatosis    Hypertension    Morbid obesity with BMI of 50.0-59.9, adult (HCC)    Oxygen deficiency    Peripheral vascular disease (HCC)    Presence of IVC filter    Has 2 IVC filters with significant thrombus burden superior to the filter.   Pulmonary embolus (HCC) 12/2016   Initially just treated with anticoagulation, but in February 2023 in April 2024 treated with EKOS thrombectomy for submassive PE.   Ventricular trigeminy    Weakness of both lower limbs     Current Facility-Administered Medications  Medication Dose Route Frequency Provider Last Rate Last Admin   acetaminophen (TYLENOL) tablet 650 mg  650 mg Oral Q6H PRN Gertha Calkin, MD       Or   acetaminophen (TYLENOL) suppository 650 mg  650 mg Rectal Q6H PRN Gertha Calkin, MD       aspirin EC tablet 81 mg  81 mg Oral Daily Gertha Calkin, MD   81 mg at 08/19/23 0947   digoxin (LANOXIN) tablet 0.0625 mg  0.0625 mg Oral Daily Gertha Calkin, MD   0.0625 mg at 08/19/23 0946   furosemide (LASIX) injection 80 mg  80 mg Intravenous BID Corwin Kuiken, DO   80 mg at 08/19/23 1235   guaiFENesin (ROBITUSSIN) 100 MG/5ML liquid 5 mL  5 mL Oral Q4H PRN Amin, Ankit C, MD       heparin ADULT infusion 100 units/mL (25000 units/227mL)  1,650 Units/hr Intravenous Continuous Merryl Hacker, RPH 16.5 mL/hr at 08/19/23 0855 1,650 Units/hr at 08/19/23 0855   hydrALAZINE (APRESOLINE) injection 10 mg  10 mg Intravenous Q4H PRN Amin, Ankit C, MD       HYDROcodone-acetaminophen (NORCO/VICODIN) 5-325 MG per tablet 1 tablet  1 tablet Oral Q4H PRN Gertha Calkin, MD       ipratropium-albuterol (DUONEB) 0.5-2.5 (3) MG/3ML nebulizer solution 3 mL  3 mL Nebulization Q4H PRN Amin, Ankit C, MD       losartan (COZAAR) tablet 25 mg  25 mg Oral Daily Talor Desrosiers, DO   25 mg at 08/19/23 1235   metoprolol succinate (TOPROL-XL) 24 hr tablet 50 mg  50 mg Oral Daily Gertha Calkin, MD   50 mg at 08/19/23 0947    metoprolol tartrate (LOPRESSOR) injection 5 mg  5 mg Intravenous Q4H PRN Amin, Ankit C, MD       morphine (PF) 2 MG/ML injection 2 mg  2 mg Intravenous Q4H PRN Gertha Calkin, MD       ondansetron (ZOFRAN) injection 4 mg  4 mg Intravenous Q6H PRN Amin, Ankit C, MD       senna-docusate (Senokot-S) tablet 1 tablet  1 tablet Oral QHS PRN Amin, Ankit C, MD       sodium chloride flush (NS) 0.9 % injection 3 mL  3 mL Intravenous Q12H Irena Cords V, MD   3 mL at 08/19/23 4098   spironolactone (ALDACTONE) tablet 25 mg  25 mg Oral Daily Gertha Calkin, MD   25 mg at 08/19/23 0947   traZODone (DESYREL)  tablet 50 mg  50 mg Oral QHS PRN Amin, Ankit C, MD       warfarin (COUMADIN) tablet 7.5 mg  7.5 mg Oral ONCE-1600 [place], [facility]       Warfarin - Pharmacist Dosing Inpatient   Does not apply q1600 [place], [facility]       Current Outpatient Medications  Medication Sig Dispense Refill   albuterol (VENTOLIN HFA) 108 (90 Base) MCG/ACT inhaler Inhale 2 puffs into the lungs every 6 (six) hours as needed for wheezing or shortness of breath. 8 g 2   furosemide (LASIX) 40 MG tablet TAKE 1 TABLET(40 MG) BY MOUTH EVERY OTHER DAY 90 tablet 0   losartan (COZAAR) 25 MG tablet Take 1 tablet (25 mg total) by mouth daily. 30 tablet 0   metoprolol succinate (TOPROL-XL) 50 MG 24 hr tablet Take 1 tablet (50 mg total) by mouth daily. Take with or immediately following a meal. 30 tablet 0   Multiple Vitamin (MULTIVITAMIN WITH MINERALS) TABS tablet Take 1 tablet by mouth daily. 30 tablet 0   ondansetron (ZOFRAN) 4 MG tablet Take 1 tablet (4 mg total) by mouth every 8 (eight) hours as needed for nausea or vomiting. 20 tablet 0   spironolactone (ALDACTONE) 25 MG tablet Take 1 tablet (25 mg total) by mouth daily. 30 tablet 0   tirzepatide (MOUNJARO) 7.5 MG/0.5ML Pen Inject 7.5 mg into the skin once a week. 2 mL 0   warfarin (COUMADIN) 5 MG tablet Take 1 tablet (5 mg total) by mouth daily. 90 tablet 1   ascorbic acid  (VITAMIN C) 500 MG tablet Take 1 tablet (500 mg total) by mouth 2 (two) times daily. (Patient not taking: Reported on 08/18/2023) 30 tablet 0   digoxin 62.5 MCG TABS Take 0.0625 mg by mouth daily. 30 tablet 0   OXYGEN Inhale 1 L into the lungs daily.      Allergies  Allergen Reactions   Metrizamide Other (See Comments)    Kidney failure requiring dialysis Kidney failure requiring dialysis    Clindamycin/Lincomycin Rash   Lincomycin Rash   Sulfa Antibiotics Rash      Social History   Socioeconomic History   Marital status: Married    Spouse name: Not on file   Number of children: Not on file   Years of education: Not on file   Highest education level: Some college, no degree  Occupational History   Not on file  Tobacco Use   Smoking status: Never   Smokeless tobacco: Never  Vaping Use   Vaping status: Never Used  Substance and Sexual Activity   Alcohol use: Never   Drug use: Never   Sexual activity: Yes    Partners: Female  Other Topics Concern   Not on file  Social History Narrative   Not on file   Social Drivers of Health   Financial Resource Strain: Low Risk  (08/18/2023)   Overall Financial Resource Strain (CARDIA)    Difficulty of Paying Living Expenses: Not hard at all  Food Insecurity: No Food Insecurity (08/18/2023)   Hunger Vital Sign    Worried About Running Out of Food in the Last Year: Never true    Ran Out of Food in the Last Year: Never true  Transportation Needs: No Transportation Needs (08/18/2023)   PRAPARE - Administrator, Civil Service (Medical): No    Lack of Transportation (Non-Medical): No  Physical Activity: Insufficiently Active (08/18/2023)   Exercise Vital Sign  Days of Exercise per Week: 3 days    Minutes of Exercise per Session: 30 min  Stress: No Stress Concern Present (08/18/2023)   [employer] of Occupational Health - Occupational Stress Questionnaire    Feeling of Stress : Only a little  Social  Connections: Moderately Integrated (08/18/2023)   Social Connection and Isolation Panel [NHANES]    Frequency of Communication with Friends and Family: More than three times a week    Frequency of Social Gatherings with Friends and Family: Once a week    Attends Religious Services: More than 4 times per year    Active Member of [employer] or Organizations: Yes    Attends Banker Meetings: More than 4 times per year    Marital Status: Separated  Intimate Partner Violence: Not At Risk (06/14/2023)   Humiliation, Afraid, Rape, and Kick questionnaire    Fear of Current or Ex-Partner: No    Emotionally Abused: No    Physically Abused: No    Sexually Abused: No      Family History  Problem Relation Age of Onset   Hypertension Mother    Cancer Mother    Breast cancer Mother    Prostate cancer Father    Hypertension Father    Diabetes Father    Heart disease Maternal Grandmother    Pulmonary embolism Paternal Grandfather    Pulmonary embolism Paternal Uncle    Pulmonary embolism Cousin    Deep vein thrombosis Cousin    Pulmonary embolism Paternal Aunt     PHYSICAL EXAM: Vitals:   08/19/23 1130 08/19/23 1200  BP: (!) 121/100 (!) 120/101  Pulse: 63 71  Resp:  17  Temp:    SpO2: 91% 97%   GENERAL: NAD HEENT: Negative for arcus senilis or xanthelasma. There is no scleral icterus.  The mucous membranes are pink and moist.   NECK: Supple, No masses. Normal carotid upstrokes without bruits. No masses or thyromegaly.    CHEST: There are no chest wall deformities. There is no chest wall tenderness. Respirations are unlabored.  Lungs- decreased at bases CARDIAC:  JVP: 10-12 cm H2O         Normal S1, S2  Normal rate with regular rhythm. No murmurs, rubs or gallops.  Pulses are 2+ and symmetrical in upper and lower extremities. +1 edema.  ABDOMEN: Soft, non-tender, non-distended. There are no masses or hepatomegaly. There are normal bowel sounds.  EXTREMITIES: Warm and well  perfused with no cyanosis, clubbing.  LYMPHATIC: No axillary or supraclavicular lymphadenopathy.  NEUROLOGIC: Patient is oriented x3 with no focal or lateralizing neurologic deficits.  PSYCH: Patients affect is appropriate, there is no evidence of anxiety or depression.  SKIN: Warm and dry; no lesions or wounds.   DATA REVIEW  ECG: 08/19/23: NSR  As per my personal interpretation  ECHO: 05/17/23: LVEF 60-65%, reduced RV function As per my personal interpretation  ASSESSMENT & PLAN:  Acute on chronic hypoxic respiratory failure & exacerbation of RV failure - On my exam patient once again volume overloaded with JVP of 12cm and mild pitting edema in bilateral lower extremities. His O2 requirement is also up once again. CTA PE with stable chronic right sided PE and resolution of prior left sided PE.  - I suspect his O2 requirement is due to volume overload in the setting of RV failure. He is not severely volume overloaded and can likely be discharged in the next 1-2 days after IV diuresis.  - Dig level <0.2 - Continue  losartan 25mg  daily.  - Continue spironolactone 25mg  daily.  - IV lasix 80mg  BID. I suspect he can possibly transition to PO diuretics tomorrow with discharge to follow.  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   2. Hypercoagulable disorder / recurrent PT - Continue warfarin  - INR pending - Discussed warfarin dosing with pharmD   Zymere Patlan Advanced Heart Failure Mechanical Circulatory Support

## 2023-08-19 NOTE — ED Notes (Addendum)
Lab contacts Presenter, broadcasting to state red tube not sent with blood completed @ pt bs by Clinical research associate RN @ [phone] - orders lab requesting are placed @ [phone]  - 28 minutes post Presenter, broadcasting lab draw for heparin titration requirement pt remains 85% on 6L O2  via [place] pt aox4 speaking in full clear sentences pleth wave form of pulse ox uniform correct equipment checked pt placed on hiflow 8L O2 with humidification to maintain O2 sat >92% pt reports comfort and relief of SOB

## 2023-08-19 NOTE — ED Notes (Signed)
Pt aox4 reports comfort and relief of previous SOB pt on hiflow 8L O2 humidified to maintain sat O2 >92%

## 2023-08-19 NOTE — Progress Notes (Addendum)
PHARMACY CONSULT NOTE - ELECTROLYTES  Pharmacy Consult for Electrolyte Monitoring and Replacement   Recent Labs: Height: 5\' 7"  (170.2 cm) Weight: (!) 156.5 kg (345 lb 0.3 oz) IBW/kg (Calculated) : 66.1 Estimated Creatinine Clearance: 87.7 mL/min (A) (by C-G formula based on SCr of 1.41 mg/dL (H)). Potassium (mmol/L)  Date Value  08/19/2023 4.0  12/28/2014 3.8   Magnesium (mg/dL)  Date Value  60/45/4098 1.9  12/28/2014 2.7 (H)   Calcium (mg/dL)  Date Value  11/91/4782 8.6 (L)   Calcium, Total (mg/dL)  Date Value  95/62/1308 8.1 (L)   Albumin (g/dL)  Date Value  65/78/4696 3.5  07/04/2023 3.9  12/23/2014 3.9   Phosphorus (mg/dL)  Date Value  29/52/8413 3.3  12/28/2014 6.1 (H)   Sodium (mmol/L)  Date Value  08/19/2023 137  07/04/2023 143  12/28/2014 130 (L)   Corrected Ca: 9.0 mg/dL  Assessment  Ryan [name] is a 52 y.o. male presenting with SHOB, CP. 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 Pharmacy has been consulted to monitor and replace electrolytes.  Diet: full liquid MIVF:  Pertinent medications: digoxin, spironolactone  Goal of Therapy: Electrolytes WNL  Plan:  No electrolyte replacement at current time Check BMP, Mg, Phos with AM labs  Thank you for allowing pharmacy to be a part of this patient's care.  Angelique Blonder, PharmD Clinical Pharmacist 08/19/2023 9:19 AM

## 2023-08-19 NOTE — ED Notes (Signed)
Writer RN requests via secure chat to Dr Allena Katz pt med rec be released per Barbara Cower in pharmacy pt requires med rec to be released by admitting physician in order to admin pt cardiovascular medications

## 2023-08-19 NOTE — Assessment & Plan Note (Addendum)
Changed warfarin to heparin.  May need heparin shots at home upon discharge to ensure effectiveness of regimen .  Pt has failed xarelto and eliquis therapy in past.

## 2023-08-19 NOTE — Progress Notes (Addendum)
PHARMACY - ANTICOAGULATION CONSULT NOTE  Pharmacy Consult for Heparin/warfarin bridge Indication:  PE/A.FIb/ DVT/ IVC filter and SVC filter  Allergies  Allergen Reactions   Metrizamide Other (See Comments)    Kidney failure requiring dialysis Kidney failure requiring dialysis    Clindamycin/Lincomycin Rash   Lincomycin Rash   Sulfa Antibiotics Rash    Patient Measurements: Height: 5\' 7"  (170.2 cm) Weight: (!) 156.5 kg (345 lb 0.3 oz) IBW/kg (Calculated) : 66.1 Heparin Dosing Weight: 104.8 kg  Vital Signs: Temp: 98.1 F (36.7 C) (12/20 0930) Temp Source: Oral (12/20 0930) BP: 144/108 (12/20 0930) Pulse Rate: 70 (12/20 0930)  Labs: Recent Labs    08/18/23 1327 08/18/23 1558 08/18/23 2005 08/19/23 0213 08/19/23 0856  HGB 16.3  --   --  15.2  --   HCT 51.1  --   --  47.1  --   PLT 281  --   --  244  --   APTT  --   --  27  --   --   LABPROT 19.6*  --   --   --   --   INR 1.6*  --   --   --   --   HEPARINUNFRC  --   --   --  0.64 0.45  CREATININE 1.60*  --   --  1.41*  --   TROPONINIHS 77* 81*  --   --   --     Estimated Creatinine Clearance: 87.7 mL/min (A) (by C-G formula based on SCr of 1.41 mg/dL (H)).   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  Regular the workup has not been forthcoming) previously followed by Dr. Isaiah Serge at [facility]   Hepatic steatosis    Hypertension    Morbid obesity with BMI of 50.0-59.9, adult (HCC)    Oxygen deficiency    Peripheral vascular disease (HCC)    Presence of IVC filter    Has 2 IVC filters with significant thrombus burden  superior to the filter.   Pulmonary embolus (HCC) 12/2016   Initially just treated with anticoagulation, but in February 2023 in April 2024 treated with EKOS thrombectomy for submassive PE.   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 On admission, patient's INR was subtherapeutic at 1.6 (INR goal per Anticoagulation clinic note is 2.5-3.5). Home warfarin dose appears to be warfarin 5 mg daily as of 07/27/23 Anticoagulation Clinic note (patient's med rec is pending). At this time, patient had a therapeutic INR of 2.8 on this regimen. Last dose of warfarin was 12/18 PM. Pharmacy was consulted to transition patient to  a heparin infusion for PE. CT Chest showed: Stable chronic right-sided PE. Left-sided PE present previously has resolved.  Baseline INR 1.6, aPTT ordered.  No signs/symptoms of bleeding noted in chart. Hgb 16.3. PLT 281.  Goal of Therapy:  INR 2-3 Heparin level 0.3-0.7 units/ml Monitor platelets by anticoagulation protocol: Yes   Plan:  12/20:  HL @ 0856 = 0.45, therapeutic X 2 Continue heparin infusion at 1650 units/hour Monitor daily heparin levels while on heparin infusion Monitor CBC and signs/symptoms of bleeding  __________________________________________________________________________ 1220 1250 Addendum  Consult placed to resume PTA warfarin. INR subtherapeutic at 1.6 on 12/19. CBC stable - Hgb 15.2, plt 244.   Goal of Therapy:  INR 2-3 Heparin level 0.3-0.7 units/ml Monitor platelets by anticoagulation protocol:  Yes  Plan: Continue heparin infusion at therapeutic rate of 1650 u/hr Give warfarin 7.5 mg (1.5x home dose) today x1 Check heparin level and INR in AM Monitor daily heparin levels and INR while inpatient - will transition off heparin once INR is therapeutic x1 Monitor CBC and signs/symptoms of bleeding.  Thank you for involving pharmacy in this patient's care.   Rockwell [place], PharmD Clinical Pharmacist 08/19/2023 10:01 AM

## 2023-08-19 NOTE — Assessment & Plan Note (Signed)
Vitals:   08/18/23 1323 08/18/23 1600 08/18/23 1811 08/18/23 2115  BP: (!) 148/100 116/85 (!) 130/100 129/86   08/18/23 2130 08/18/23 2200 08/19/23 0000 08/19/23 0030  BP: 127/82 (!) 126/92 125/81 131/87   08/19/23 0130 08/19/23 0200 08/19/23 0230  BP: (!) 122/94 (!) 137/91 (!) 141/99  Cont metoprolol and aldactone.

## 2023-08-19 NOTE — Assessment & Plan Note (Addendum)
D/w pt briefly about weight and risk for PE.  Due to chronic and recurrent and multiple PE with recent PE will start patient on heparin drip right now.

## 2023-08-19 NOTE — Assessment & Plan Note (Signed)
2/2 to PE and pulmonary Htn . Diurese on PRN basis. Supplemental oxygen.

## 2023-08-19 NOTE — Assessment & Plan Note (Signed)
Patient is currently mild to moderate clinical presentation for CHF.  He is not overtly volume overloaded but definitely has peripheral edema and no shortness of breath or JVD or rales on exam.

## 2023-08-20 DIAGNOSIS — I2724 Chronic thromboembolic pulmonary hypertension: Secondary | ICD-10-CM

## 2023-08-20 DIAGNOSIS — I50810 Right heart failure, unspecified: Secondary | ICD-10-CM

## 2023-08-20 DIAGNOSIS — Z95828 Presence of other vascular implants and grafts: Secondary | ICD-10-CM | POA: Insufficient documentation

## 2023-08-20 DIAGNOSIS — I5033 Acute on chronic diastolic (congestive) heart failure: Secondary | ICD-10-CM | POA: Diagnosis not present

## 2023-08-20 DIAGNOSIS — G4731 Primary central sleep apnea: Secondary | ICD-10-CM | POA: Insufficient documentation

## 2023-08-20 DIAGNOSIS — I890 Lymphedema, not elsewhere classified: Secondary | ICD-10-CM | POA: Insufficient documentation

## 2023-08-20 DIAGNOSIS — I289 Disease of pulmonary vessels, unspecified: Secondary | ICD-10-CM

## 2023-08-20 DIAGNOSIS — E119 Type 2 diabetes mellitus without complications: Secondary | ICD-10-CM | POA: Insufficient documentation

## 2023-08-20 DIAGNOSIS — I5031 Acute diastolic (congestive) heart failure: Secondary | ICD-10-CM | POA: Diagnosis not present

## 2023-08-20 LAB — PROTIME-INR
INR: 1.5 — ABNORMAL HIGH (ref 0.8–1.2)
Prothrombin Time: 18.2 s — ABNORMAL HIGH (ref 11.4–15.2)

## 2023-08-20 LAB — CBC
HCT: 47.3 % (ref 39.0–52.0)
Hemoglobin: 15.7 g/dL (ref 13.0–17.0)
MCH: 31.3 pg (ref 26.0–34.0)
MCHC: 33.2 g/dL (ref 30.0–36.0)
MCV: 94.2 fL (ref 80.0–100.0)
Platelets: 233 10*3/uL (ref 150–400)
RBC: 5.02 MIL/uL (ref 4.22–5.81)
RDW: 14.2 % (ref 11.5–15.5)
WBC: 4.6 10*3/uL (ref 4.0–10.5)
nRBC: 0 % (ref 0.0–0.2)

## 2023-08-20 LAB — BASIC METABOLIC PANEL
Anion gap: 11 (ref 5–15)
BUN: 21 mg/dL — ABNORMAL HIGH (ref 6–20)
CO2: 23 mmol/L (ref 22–32)
Calcium: 8.7 mg/dL — ABNORMAL LOW (ref 8.9–10.3)
Chloride: 101 mmol/L (ref 98–111)
Creatinine, Ser: 1.46 mg/dL — ABNORMAL HIGH (ref 0.61–1.24)
GFR, Estimated: 57 mL/min — ABNORMAL LOW (ref >60)
Glucose, Bld: 101 mg/dL — ABNORMAL HIGH (ref 70–99)
Potassium: 4 mmol/L (ref 3.5–5.1)
Sodium: 135 mmol/L (ref 135–145)

## 2023-08-20 LAB — PROTEIN ELECTROPHORESIS, SERUM
Albumin ELP: 4 g/dL (ref 3.8–4.8)
Alpha 1: 0.3 g/dL (ref 0.2–0.3)
Alpha 2: 0.8 g/dL (ref 0.5–0.9)
Beta 2: 0.6 g/dL — ABNORMAL HIGH (ref 0.2–0.5)
Beta Globulin: 0.6 g/dL (ref 0.4–0.6)
Gamma Globulin: 1.5 g/dL (ref 0.8–1.7)
Total Protein: 7.8 g/dL (ref 6.1–8.1)

## 2023-08-20 LAB — HEPARIN LEVEL (UNFRACTIONATED): Heparin Unfractionated: 0.5 [IU]/mL (ref 0.30–0.70)

## 2023-08-20 LAB — MAGNESIUM: Magnesium: 1.9 mg/dL (ref 1.7–2.4)

## 2023-08-20 MED ORDER — TORSEMIDE 20 MG PO TABS
40.0000 mg | ORAL_TABLET | Freq: Two times a day (BID) | ORAL | Status: DC
  Administered 2023-08-20 – 2023-08-22 (×4): 40 mg via ORAL
  Filled 2023-08-20 (×4): qty 2

## 2023-08-20 MED ORDER — WARFARIN SODIUM 7.5 MG PO TABS
7.5000 mg | ORAL_TABLET | Freq: Once | ORAL | Status: AC
  Administered 2023-08-20: 7.5 mg via ORAL
  Filled 2023-08-20 (×2): qty 1

## 2023-08-20 NOTE — Progress Notes (Signed)
Progress Note  Patient Name: Ryan [name] Date of Encounter: 08/20/2023  [facility] Cardiologist: None   Patient Profile     Subjective   Patient feels much better today.  Denies any shortness of breath.  He has been off oxygen some today and he says is the longest amount of time consistently that he has been off O2 with stable O2 sats in the mid to low 90s.  He put out 1.7 LYesterday and is net -1.9 L since admission.  Inpatient Medications    Scheduled Meds:  aspirin EC  81 mg Oral Daily   digoxin  0.0625 mg Oral Daily   furosemide  80 mg Intravenous BID   losartan  25 mg Oral Daily   metoprolol succinate  50 mg Oral Daily   sodium chloride flush  3 mL Intravenous Q12H   spironolactone  25 mg Oral Daily   warfarin  7.5 mg Oral ONCE-1600   Warfarin - Pharmacist Dosing Inpatient   Does not apply q1600   Continuous Infusions:  heparin 1,650 Units/hr (08/20/23 0026)   PRN Meds: acetaminophen **OR** acetaminophen, guaiFENesin, hydrALAZINE, HYDROcodone-acetaminophen, ipratropium-albuterol, morphine injection, ondansetron (ZOFRAN) IV, senna-docusate, traZODone   Vital Signs    Vitals:   08/20/23 0911 08/20/23 1100 08/20/23 1200 08/20/23 1420  BP:  (!) 124/97 119/77 117/83  Pulse:  73 77 78  Resp:  13 19 18   Temp: 98.4 F (36.9 C)   97.7 F (36.5 C)  TempSrc: Axillary     SpO2:  91% 97% 100%  Weight:      Height:        Intake/Output Summary (Last 24 hours) at 08/20/2023 1512 Last data filed at 08/19/2023 1724 Gross per 24 hour  Intake --  Output 1500 ml  Net -1500 ml      08/18/2023    1:23 PM 08/18/2023    8:54 AM 07/20/2023    8:08 AM  Last 3 Weights  Weight (lbs) 345 lb 0.3 oz 345 lb 339 lb 6.4 oz  Weight (kg) 156.5 kg 156.491 kg 153.951 kg      Telemetry    Normal sinus rhythm- Personally Reviewed  ECG    No new EKG to review- Personally Reviewed  Physical Exam   GEN: No acute distress.  Morbidly obese Neck: Cannot assess JVD due  to patient's underlying body habitus Cardiac: RRR, no murmurs, rubs, or gallops.  Respiratory: Clear to auscultation bilaterally. GI: Soft, nontender, non-distended  MS: Thick skin with chronic venous stasis changes with 1+ lower extremity edema; No deformity. Neuro:  Nonfocal  Psych: Normal affect   Labs    High Sensitivity Troponin:   Recent Labs  Lab 08/18/23 1327 08/18/23 1558  TROPONINIHS 77* 81*      Chemistry Recent Labs  Lab 08/18/23 1327 08/18/23 1558 08/19/23 0213 08/20/23 0032  NA 138  --  137 135  K 4.0  --  4.0 4.0  CL 104  --  104 101  CO2 23  --  24 23  GLUCOSE 118*  --  100* 101*  BUN 18  --  18 21*  CREATININE 1.60*  --  1.41* 1.46*  CALCIUM 9.2  --  8.6* 8.7*  PROT  --  7.3 7.4  --   ALBUMIN  --  3.5 3.5  --   AST  --  31 29  --   ALT  --  29 24  --   ALKPHOS  --  55 56  --  BILITOT  --  0.8 1.7*  --   GFRNONAA 51*  --  60* 57*  ANIONGAP 11  --  9 11     Hematology Recent Labs  Lab 08/18/23 1327 08/19/23 0213 08/20/23 0032  WBC 5.1 5.1 4.6  RBC 5.26 4.87 5.02  HGB 16.3 15.2 15.7  HCT 51.1 47.1 47.3  MCV 97.1 96.7 94.2  MCH 31.0 31.2 31.3  MCHC 31.9 32.3 33.2  RDW 14.4 14.5 14.2  PLT 281 244 233    BNP Recent Labs  Lab 08/19/23 0213  BNP 267.8*     DDimer No results for input(s): "DDIMER" in the last 168 hours.   CHA2DS2-VASc Score = 4   This indicates a 4.8% annual risk of stroke. The patient's score is based upon: CHF History: 1 HTN History: 1 Diabetes History: 1 Stroke History: 0 Vascular Disease History: 1 Age Score: 0 Gender Score: 0      Radiology    CT Angio Chest PE W and/or Wo Contrast Result Date: 08/18/2023 CLINICAL DATA:  Follow up chronic PE.  SOB and chest pain. EXAM: CT ANGIOGRAPHY CHEST WITH CONTRAST TECHNIQUE: Multidetector CT imaging of the chest was performed using the standard protocol during bolus administration of intravenous contrast. Multiplanar CT image reconstructions and MIPs were  obtained to evaluate the vascular anatomy. RADIATION DOSE REDUCTION: This exam was performed according to the departmental dose-optimization program which includes automated exposure control, adjustment of the mA and/or kV according to patient size and/or use of iterative reconstruction technique. CONTRAST:  OMNIPAQUE IOHEXOL 350 MG/ML SOLN COMPARISON:  06/14/2023. FINDINGS: Cardiovascular: Stable large chronic nonocclusive right sided pulmonary embolism. Left-sided PE present previously appears to have partially resolved. Evidence of right heart strain and tricuspid regurgitation. No new PE. No pericardial effusion. There cardiomegaly. No aortic aneurysm. Mediastinum/Nodes: No enlarged mediastinal, hilar, or axillary lymph nodes. Thyroid gland, trachea, and esophagus demonstrate no significant findings. Lungs/Pleura: Lungs are clear. No pleural effusion or pneumothorax. Upper Abdomen: No acute abnormality.  Suprarenal IVC filter noted. Musculoskeletal: Thoracic degenerative changes. Review of the MIP images confirms the above findings. IMPRESSION: 1. Stable chronic right-sided PE. Left-sided PE present previously has resolved. 2. Right heart strain and tricuspid regurgitation. 3. Suprarenal IVC filter. Electronically Signed   By: [name] M.D.   On: 08/18/2023 17:27    Patient Profile     52 y.o. male  with an extensive clotting history with recurrent PE status post mechanical thrombectomy after presenting with worsening shortness of breath 05/2023 and found to have persistent nonocclusive thrombus in the right main pulmonary artery as well as a new small left upper lobe segmental PE despite being compliant with Xarelto and warfarin.  He has a strong family history of clotting disorders.  This admission presented to the ER with 24 to 48 hours of worsening shortness of breath which came on suddenly and was diagnosed with stable chronic right sided PE and left-sided PE that was present prior had  resolved.  There was evidence of right heart strain and TR.  Suprarenal IVC was in place.  Cardiology was asked to consult for RV failure  Assessment & Plan    Acute on chronic hypoxic respiratory failure & exacerbation of RV failure -On admission was volume overloaded with JVP of 12cm and mild pitting edema in bilateral lower extremities. His O2 requirement was also up again.[name]  -CTA PE with stable chronic right sided PE and resolution of prior left sided PE.  -Suspect his O2 requirement  was due to volume overload in the setting of RV failure.  - Dig level <0.2 - He is doing well today with resolution of his shortness of breath and minimal lower extremity edema -He put out 1.7 L yesterday and is net -1.9 L since admission.   -Serum creatinine stable at 1.46 and potassium 4 and mag 1.9  -He tells me that prior to admission he had not been urinating as much with his Lasix 40 mg every other day and I wonder if this is due to primarily right-sided heart failure with decreased GI absorption from bowel wall edema  -Will change home dose of Lasix to [facility] for better absorption at 40 mg twice daily starting today and reassess in the a.m.  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    Hypercoagulable disorder / recurrent PT - Continue warfarin with IV heparin bridge until therapeutic INR greater than 2 - INR 1.5 today - Discussed warfarin dosing with pharmD  I spent 40 minutes caring for this patient today face to face, ordering and reviewing labs, reviewing records from hospital notes from this admission, 2D echo 06/21/2023, chest CT 08/18/2023, seeing the patient, documenting in the record,  and adjusting heart failure meds.     For questions or updates, please contact [facility] Please consult [email or web address] for contact info under         Signed, Armanda Magic, MD  08/20/2023, 3:12 PM

## 2023-08-20 NOTE — Assessment & Plan Note (Addendum)
Unclear if nephrotic range,  which would increase his propensity for DVT/PE.  Referring to nephrology. SPEP/ Urine IFE ordered   Lab Results  Component Value Date   LABMICR 824.2 10/15/2022   LABMICR See below: 03/18/2022   MICROALBUR 178.9 (H) 08/18/2023

## 2023-08-20 NOTE — Assessment & Plan Note (Signed)
Goal INR is 2.5 to 3 given recurrent thrombotic events with  INRs < 2.0

## 2023-08-20 NOTE — Assessment & Plan Note (Addendum)
Referred for biopsy but not done per oncology notes.  Repeat CT suggests benign etiology

## 2023-08-20 NOTE — Progress Notes (Signed)
PHARMACY CONSULT NOTE - ELECTROLYTES  Pharmacy Consult for Electrolyte Monitoring and Replacement   Recent Labs: Height: 5\' 7"  (170.2 cm) Weight: (!) 156.5 kg (345 lb 0.3 oz) IBW/kg (Calculated) : 66.1 Estimated Creatinine Clearance: 84.7 mL/min (A) (by C-G formula based on SCr of 1.46 mg/dL (H)). Potassium (mmol/L)  Date Value  08/20/2023 4.0  12/28/2014 3.8   Magnesium (mg/dL)  Date Value  16/05/9603 1.9  12/28/2014 2.7 (H)   Calcium (mg/dL)  Date Value  54/04/8118 8.7 (L)   Calcium, Total (mg/dL)  Date Value  14/78/2956 8.1 (L)   Albumin (g/dL)  Date Value  21/30/8657 3.5  07/04/2023 3.9  12/23/2014 3.9   Phosphorus (mg/dL)  Date Value  84/69/6295 3.9  12/28/2014 6.1 (H)   Sodium (mmol/L)  Date Value  08/20/2023 135  07/04/2023 143  12/28/2014 130 (L)   Corrected Ca: 9.0 mg/dL  Assessment  Ryan [name] is a 52 y.o. male presenting with SHOB, CP. 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 Pharmacy has been consulted to monitor and replace electrolytes.  Diet: full liquid MIVF:  Pertinent medications: digoxin, spironolactone, lasix 80 mg bID IV, losartan 25 mg daily,   Goal of Therapy: Electrolytes WNL  Plan:  No replacement needed F/u with AM labs.   Thank you for allowing pharmacy to be a part of this patient's care.  Ronnald Ramp, PharmD Clinical Pharmacist 08/20/2023 12:47 PM

## 2023-08-20 NOTE — Progress Notes (Signed)
PROGRESS NOTE    Ryan [name]  ZOX:[id number] DOB: 1971-04-25 DOA: 08/18/2023 PCP: Sherlene Shams, MD    Brief Narrative:  52 year old with history of morbid obesity, chronic lower extremity wounds, CHF with preserved EF, DM2, PE/DVT status post IVC on Coumadin since April 2024, subtherapeutic INR, failed Eliquis and Xarelto in the past, admitted for shortness of breath and chest pain.  Cardiology team consulted who recommended IV diuretics.   Assessment & Plan:  Active Problems:   Morbid obesity with BMI of 50.0-59.9, adult (HCC)   Chronic diastolic CHF (congestive heart failure) (HCC)   History of pulmonary embolism and DVT   Essential hypertension   Type 2 diabetes mellitus with diabetic chronic kidney disease (HCC)   CKD (chronic kidney disease), stage IIIa   Current use of long term anticoagulation   Paroxysmal atrial fibrillation (HCC)   SOB (shortness of breath)   Acute respiratory distress; multifactorial -Secondary to morbid obesity and poor compliance.  Seen by cardiology recommending IV diuretics. -Troponins are flat, procalcitonin negative.  Acute on chronic congestive heart failure with preserved EF with right-sided ventricular failure -Metoprolol, Aldactone.  Preserved EF but does have reduced RV function with TR.  Diuretics Lasix 80 mg IV twice daily, will transition to p.o. when okay with cardiology  Diabetes mellitus type 2 -Last A1c 6.1.  Prediabetic range.  Does not appear to be any medications  History of pulmonary embolism/DVT status post IVC filter Chronic anticoagulation - CTA chest shows left-sided PE has resolved, on the right side has nonocclusive chronic PE which appears to be stable.  Continue Coumadin outpatient.  Currently on heparin drip due to subtherapeutic INR  Essential hypertension History of paroxysmal A-fib -On metoprolol, Aldactone, digoxin.  IV as needed  Morbid obesity with BMI greater than 50 -Needs to follow-up outpatient PCP and  possible consider bariatric surgery    CKD stage IIIa -Cr is stable at 1.3   DVT prophylaxis: Heparin drip Code Status: Full Family Communication:   Status is: Inpatient Remains inpatient appropriate because: Continue hospital stay to help further evaluate his acute respiratory distress.  Appears to be multifactorial in nature.  Getting IV diuretics, heparin to Coumadin bridge    Subjective: Feels little better.  Still having some exertional dyspnea.  Examination:  General exam: Appears calm and comfortable, morbid obesity Respiratory system: Clear to auscultation. Respiratory effort normal. Cardiovascular system: S1 & S2 heard, RRR. No JVD, murmurs, rubs, gallops or clicks. No pedal edema. Gastrointestinal system: Abdomen is nondistended, soft and nontender. No organomegaly or masses felt. Normal bowel sounds heard. Central nervous system: Alert and oriented. No focal neurological deficits. Extremities: Symmetric 5 x 5 power. Skin: Bilateral lower extremity chronic skin changes Psychiatry: Judgement and insight appear normal. Mood & affect appropriate.                Diet Orders (From admission, onward)     Start     Ordered   08/19/23 1535  Diet 2 gram sodium Room service appropriate? Yes; Fluid consistency: Thin  Diet effective now       Question Answer Comment  Room service appropriate? Yes   Fluid consistency: Thin      08/19/23 1534            Objective: Vitals:   08/20/23 0619 08/20/23 0800 08/20/23 0911 08/20/23 1100  BP:  (!) 112/47  (!) 124/97  Pulse:  66  73  Resp:  18  13  Temp: 97.8 F (36.6 C)  98.4 F (36.9 C)   TempSrc: Oral  Axillary   SpO2:  94%  91%  Weight:      Height:        Intake/Output Summary (Last 24 hours) at 08/20/2023 1156 Last data filed at 08/19/2023 1724 Gross per 24 hour  Intake --  Output 1500 ml  Net -1500 ml   Filed Weights   08/18/23 1323  Weight: (!) 156.5 kg    Scheduled Meds:  aspirin EC  81  mg Oral Daily   digoxin  0.0625 mg Oral Daily   furosemide  80 mg Intravenous BID   losartan  25 mg Oral Daily   metoprolol succinate  50 mg Oral Daily   sodium chloride flush  3 mL Intravenous Q12H   spironolactone  25 mg Oral Daily   Warfarin - Pharmacist Dosing Inpatient   Does not apply q1600   Continuous Infusions:  heparin 1,650 Units/hr (08/20/23 0026)    Nutritional status     Body mass index is 54.04 kg/m.  Data Reviewed:   CBC: Recent Labs  Lab 08/18/23 1327 08/19/23 0213 08/20/23 0032  WBC 5.1 5.1 4.6  HGB 16.3 15.2 15.7  HCT 51.1 47.1 47.3  MCV 97.1 96.7 94.2  PLT 281 244 233   Basic Metabolic Panel: Recent Labs  Lab 08/18/23 1327 08/19/23 0213 08/19/23 0457 08/20/23 0032  NA 138 137  --  135  K 4.0 4.0  --  4.0  CL 104 104  --  101  CO2 23 24  --  23  GLUCOSE 118* 100*  --  101*  BUN 18 18  --  21*  CREATININE 1.60* 1.41*  --  1.46*  CALCIUM 9.2 8.6*  --  8.7*  MG  --   --  1.9 1.9  PHOS  --   --  3.9  --    GFR: Estimated Creatinine Clearance: 84.7 mL/min (A) (by C-G formula based on SCr of 1.46 mg/dL (H)). Liver Function Tests: Recent Labs  Lab 08/18/23 1558 08/19/23 0213  AST 31 29  ALT 29 24  ALKPHOS 55 56  BILITOT 0.8 1.7*  PROT 7.3 7.4  ALBUMIN 3.5 3.5   No results for input(s): "LIPASE", "AMYLASE" in the last 168 hours. No results for input(s): "AMMONIA" in the last 168 hours. Coagulation Profile: Recent Labs  Lab 08/18/23 1327 08/20/23 0032  INR 1.6* 1.5*   Cardiac Enzymes: No results for input(s): "CKTOTAL", "CKMB", "CKMBINDEX", "TROPONINI" in the last 168 hours. BNP (last 3 results) No results for input(s): "PROBNP" in the last 8760 hours. HbA1C: No results for input(s): "HGBA1C" in the last 72 hours. CBG: No results for input(s): "GLUCAP" in the last 168 hours. Lipid Profile: No results for input(s): "CHOL", "HDL", "LDLCALC", "TRIG", "CHOLHDL", "LDLDIRECT" in the last 72 hours. Thyroid Function Tests: Recent  Labs    08/19/23 0213  TSH 3.471  FREET4 1.12   Anemia Panel: No results for input(s): "VITAMINB12", "FOLATE", "FERRITIN", "TIBC", "IRON", "RETICCTPCT" in the last 72 hours. Sepsis Labs: Recent Labs  Lab 08/18/23 2005 08/19/23 1610  PROCALCITON  --  <0.10  LATICACIDVEN 1.4  --     No results found for this or any previous visit (from the past 240 hours).       Radiology Studies: CT Angio Chest PE W and/or Wo Contrast Result Date: 08/18/2023 CLINICAL DATA:  Follow up chronic PE.  SOB and chest pain. EXAM: CT ANGIOGRAPHY CHEST WITH CONTRAST TECHNIQUE: Multidetector CT imaging of the  chest was performed using the standard protocol during bolus administration of intravenous contrast. Multiplanar CT image reconstructions and MIPs were obtained to evaluate the vascular anatomy. RADIATION DOSE REDUCTION: This exam was performed according to the departmental dose-optimization program which includes automated exposure control, adjustment of the mA and/or kV according to patient size and/or use of iterative reconstruction technique. CONTRAST:  OMNIPAQUE IOHEXOL 350 MG/ML SOLN COMPARISON:  06/14/2023. FINDINGS: Cardiovascular: Stable large chronic nonocclusive right sided pulmonary embolism. Left-sided PE present previously appears to have partially resolved. Evidence of right heart strain and tricuspid regurgitation. No new PE. No pericardial effusion. There cardiomegaly. No aortic aneurysm. Mediastinum/Nodes: No enlarged mediastinal, hilar, or axillary lymph nodes. Thyroid gland, trachea, and esophagus demonstrate no significant findings. Lungs/Pleura: Lungs are clear. No pleural effusion or pneumothorax. Upper Abdomen: No acute abnormality.  Suprarenal IVC filter noted. Musculoskeletal: Thoracic degenerative changes. Review of the MIP images confirms the above findings. IMPRESSION: 1. Stable chronic right-sided PE. Left-sided PE present previously has resolved. 2. Right heart strain and  tricuspid regurgitation. 3. Suprarenal IVC filter. Electronically Signed   By: [name] M.D.   On: 08/18/2023 17:27   DG Chest 2 View Result Date: 08/18/2023 CLINICAL DATA:  cp, sob EXAM: CHEST - 2 VIEW COMPARISON:  07/19/2023. FINDINGS: The heart size and mediastinal contours are within normal limits. Both lungs are clear. No pneumothorax or pleural effusion. There are thoracic degenerative changes. IMPRESSION: No acute cardiopulmonary disease. Electronically Signed   By: [name] M.D.   On: 08/18/2023 17:21           LOS: 2 days   Time spent= 35 mins    Miguel Rota, MD [facility]  If 7PM-7AM, please contact night-coverage  08/20/2023, 11:56 AM

## 2023-08-20 NOTE — Progress Notes (Signed)
PHARMACY - ANTICOAGULATION CONSULT NOTE  Pharmacy Consult for Heparin/warfarin bridge Indication:  PE/A.FIb/ DVT/ IVC filter and SVC filter  Allergies  Allergen Reactions   Metrizamide Other (See Comments)    Kidney failure requiring dialysis Kidney failure requiring dialysis    Clindamycin/Lincomycin Rash   Lincomycin Rash   Sulfa Antibiotics Rash    Patient Measurements: Height: 5\' 7"  (170.2 cm) Weight: (!) 156.5 kg (345 lb 0.3 oz) IBW/kg (Calculated) : 66.1 Heparin Dosing Weight: 104.8 kg  Vital Signs: Temp: 97.5 F (36.4 C) (12/21 0027) Temp Source: Oral (12/21 0027) BP: 115/92 (12/21 0400) Pulse Rate: 63 (12/21 0400)  Labs: Recent Labs    08/18/23 1327 08/18/23 1558 08/18/23 2005 08/19/23 0213 08/19/23 0856 08/20/23 0032  HGB 16.3  --   --  15.2  --  15.7  HCT 51.1  --   --  47.1  --  47.3  PLT 281  --   --  244  --  233  APTT  --   --  27  --   --   --   LABPROT 19.6*  --   --   --   --  18.2*  INR 1.6*  --   --   --   --  1.5*  HEPARINUNFRC  --   --   --  0.64 0.45 0.50  CREATININE 1.60*  --   --  1.41*  --  1.46*  TROPONINIHS 77* 81*  --   --   --   --     Estimated Creatinine Clearance: 84.7 mL/min (A) (by C-G formula based on SCr of 1.46 mg/dL (H)).   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  Regular the workup has not been forthcoming) previously followed by Dr. Isaiah Serge at [facility]   Hepatic steatosis    Hypertension    Morbid obesity with BMI of 50.0-59.9, adult (HCC)    Oxygen deficiency    Peripheral vascular disease (HCC)    Presence of IVC  filter    Has 2 IVC filters with significant thrombus burden superior to the filter.   Pulmonary embolus (HCC) 12/2016   Initially just treated with anticoagulation, but in February 2023 in April 2024 treated with EKOS thrombectomy for submassive PE.   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 On admission, patient's INR was subtherapeutic at 1.6 (INR goal per Anticoagulation clinic note is 2.5-3.5). Home warfarin dose appears to be warfarin 5 mg daily as of 07/27/23 Anticoagulation Clinic note (patient's med rec is pending). At this time, patient had a therapeutic INR of 2.8 on this  regimen. Last dose of warfarin was 12/18 PM. Pharmacy was consulted to transition patient to a heparin infusion for PE. CT Chest showed: Stable chronic right-sided PE. Left-sided PE present previously has resolved.  Baseline INR 1.6, aPTT ordered.  No signs/symptoms of bleeding noted in chart. Hgb 16.3. PLT 281.  Goal of Therapy:  INR 2-3 Heparin level 0.3-0.7 units/ml Monitor platelets by anticoagulation protocol: Yes   Plan:  12/21:  HL @ 0032 = 0.50, therapeutic X 3 - Will continue pt on current rate and recheck HL on 12/22 with AM labs.  __________________________________________________________________________ 1220 1250 Addendum  Consult placed to resume PTA warfarin. INR subtherapeutic at 1.6 on 12/19. CBC stable - Hgb 15.2, plt 244.   Goal of Therapy:  INR 2-3 Heparin level 0.3-0.7 units/ml Monitor platelets by anticoagulation protocol:  Yes  Plan: Continue heparin infusion at therapeutic rate of 1650 u/hr Give warfarin 7.5 mg (1.5x home dose) today x1 Check heparin level and INR in AM Monitor daily heparin levels and INR while inpatient - will transition off heparin once INR is therapeutic x1 Monitor CBC and signs/symptoms of bleeding.  Thank you for involving pharmacy in this patient's care.   Rayshawn Visconti D Clinical Pharmacist 08/20/2023 4:33 AM

## 2023-08-20 NOTE — Assessment & Plan Note (Addendum)
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  He has had hematologic evaluations which have been "Inconclusive" fper patient but negative per review of hematology notes . Recent summary by  [facility] during [place] visit reviewed "  , he  is s/p IVC filter placement x2 (2005, 2012) and RLE bleeding varicosity s/p sclerotherapy 02/2020. Given patient's history of multiple VTE, and given that patient currently has not had any active ulcerations on his BLE., he was note advised to attempt  IVC retrieval .   However given his recent leg ulcers, this may need to be revisited.

## 2023-08-20 NOTE — Assessment & Plan Note (Signed)
Diagnosed during 15 minutes of sleep during November 2024  in lab sleep study with AJI of 115 and desats to 85% . Sleep study was aborted due to patient's report of orthopnea and he was transferred to ER.  He will need a titration study and likely BIPAP

## 2023-08-20 NOTE — Progress Notes (Signed)
PHARMACY - ANTICOAGULATION CONSULT NOTE  Pharmacy Consult for Heparin/warfarin bridge Indication:  PE/A.FIb/ DVT/ IVC filter and SVC filter  Allergies  Allergen Reactions   Metrizamide Other (See Comments)    Kidney failure requiring dialysis Kidney failure requiring dialysis    Clindamycin/Lincomycin Rash   Lincomycin Rash   Sulfa Antibiotics Rash    Patient Measurements: Height: 5\' 7"  (170.2 cm) Weight: (!) 156.5 kg (345 lb 0.3 oz) IBW/kg (Calculated) : 66.1 Heparin Dosing Weight: 104.8 kg  Vital Signs: Temp: 98.4 F (36.9 C) (12/21 0911) Temp Source: Axillary (12/21 0911) BP: 119/77 (12/21 1200) Pulse Rate: 77 (12/21 1200)  Labs: Recent Labs    08/18/23 1327 08/18/23 1558 08/18/23 2005 08/19/23 0213 08/19/23 0856 08/20/23 0032  HGB 16.3  --   --  15.2  --  15.7  HCT 51.1  --   --  47.1  --  47.3  PLT 281  --   --  244  --  233  APTT  --   --  27  --   --   --   LABPROT 19.6*  --   --   --   --  18.2*  INR 1.6*  --   --   --   --  1.5*  HEPARINUNFRC  --   --   --  0.64 0.45 0.50  CREATININE 1.60*  --   --  1.41*  --  1.46*  TROPONINIHS 77* 81*  --   --   --   --     Estimated Creatinine Clearance: 84.7 mL/min (A) (by C-G formula based on SCr of 1.46 mg/dL (H)).   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  Regular the workup has not been forthcoming) previously followed by Dr. Isaiah Serge at [facility]   Hepatic steatosis    Hypertension    Morbid obesity with BMI of 50.0-59.9, adult (HCC)    Oxygen deficiency    Peripheral vascular disease (HCC)    Presence of IVC  filter    Has 2 IVC filters with significant thrombus burden superior to the filter.   Pulmonary embolus (HCC) 12/2016   Initially just treated with anticoagulation, but in February 2023 in April 2024 treated with EKOS thrombectomy for submassive PE.   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 On admission, patient's INR was subtherapeutic at 1.6 (INR goal per Anticoagulation clinic note is 2.5-3.5). Home warfarin dose appears to be warfarin 5 mg daily as of 07/27/23 Anticoagulation Clinic note (patient's med rec is pending). At this time, patient had a therapeutic INR of 2.8 on this  regimen. Last dose of warfarin was 12/18 PM. Pharmacy was consulted to transition patient to a heparin infusion for PE. CT Chest showed: Stable chronic right-sided PE. Left-sided PE present previously has resolved. CBC stable. Pt is volume overloaded.   Date INR Warfarin Dose  12/20 1.6 7.5 mg  12/21 1.5 7.5 mg      Goal of Therapy:  INR 2-3 Heparin level 0.3-0.7 units/ml Monitor platelets by anticoagulation protocol: Yes   Plan:  Heparin:  Heparin level is therapeutic x 2. Will continue heparin infusion at 1650 units/hr. Recheck heparin level and CBC with AM labs.   Warfarin:  INR is subtherapeutic. Will give warfarin 7.5 mg x 1 (~50% increase from home dose). Daily INR. As the fluids comes off the patient, the INR will start to increase as well.    Paschal Dopp, PharmD Clinical Pharmacist 08/20/2023 12:12 PM

## 2023-08-20 NOTE — Assessment & Plan Note (Signed)
Noted during review of wound care notes. Unclear if lymphedema predates his histor yof DVT/PE .  Patient has not used his lympedema pumps in years .  Recent venous leg ulcers now healed.

## 2023-08-20 NOTE — Plan of Care (Signed)

## 2023-08-20 NOTE — Assessment & Plan Note (Signed)
Advised to incresae Mounjaro dose to 7.5 mg weekly to continue weight loss  Lab Results  Component Value Date   HGBA1C 6.1 (H) 06/14/2023

## 2023-08-21 DIAGNOSIS — I5031 Acute diastolic (congestive) heart failure: Secondary | ICD-10-CM | POA: Diagnosis not present

## 2023-08-21 DIAGNOSIS — I50810 Right heart failure, unspecified: Secondary | ICD-10-CM | POA: Diagnosis not present

## 2023-08-21 DIAGNOSIS — I2724 Chronic thromboembolic pulmonary hypertension: Secondary | ICD-10-CM | POA: Diagnosis not present

## 2023-08-21 DIAGNOSIS — I289 Disease of pulmonary vessels, unspecified: Secondary | ICD-10-CM | POA: Diagnosis not present

## 2023-08-21 LAB — HEPARIN LEVEL (UNFRACTIONATED)
Heparin Unfractionated: 0.71 [IU]/mL — ABNORMAL HIGH (ref 0.30–0.70)
Heparin Unfractionated: 0.52 [IU]/mL (ref 0.30–0.70)
Heparin Unfractionated: 0.51 [IU]/mL (ref 0.30–0.70)

## 2023-08-21 LAB — PROTIME-INR
INR: 1.7 — ABNORMAL HIGH (ref 0.8–1.2)
Prothrombin Time: 19.8 s — ABNORMAL HIGH (ref 11.4–15.2)

## 2023-08-21 LAB — CBC
HCT: 47.4 % (ref 39.0–52.0)
Hemoglobin: 15.8 g/dL (ref 13.0–17.0)
MCH: 30.9 pg (ref 26.0–34.0)
MCHC: 33.3 g/dL (ref 30.0–36.0)
MCV: 92.8 fL (ref 80.0–100.0)
Platelets: 260 10*3/uL (ref 150–400)
RBC: 5.11 MIL/uL (ref 4.22–5.81)
RDW: 14.1 % (ref 11.5–15.5)
WBC: 4.4 10*3/uL (ref 4.0–10.5)
nRBC: 0 % (ref 0.0–0.2)

## 2023-08-21 LAB — BASIC METABOLIC PANEL
Anion gap: 12 (ref 5–15)
BUN: 29 mg/dL — ABNORMAL HIGH (ref 6–20)
CO2: 23 mmol/L (ref 22–32)
Calcium: 8.8 mg/dL — ABNORMAL LOW (ref 8.9–10.3)
Chloride: 100 mmol/L (ref 98–111)
Creatinine, Ser: 1.56 mg/dL — ABNORMAL HIGH (ref 0.61–1.24)
GFR, Estimated: 53 mL/min — ABNORMAL LOW (ref >60)
Glucose, Bld: 105 mg/dL — ABNORMAL HIGH (ref 70–99)
Potassium: 3.8 mmol/L (ref 3.5–5.1)
Sodium: 135 mmol/L (ref 135–145)

## 2023-08-21 LAB — MAGNESIUM: Magnesium: 2.2 mg/dL (ref 1.7–2.4)

## 2023-08-21 MED ORDER — WARFARIN SODIUM 7.5 MG PO TABS
7.5000 mg | ORAL_TABLET | Freq: Once | ORAL | Status: AC
  Administered 2023-08-21: 7.5 mg via ORAL
  Filled 2023-08-21: qty 1

## 2023-08-21 NOTE — Progress Notes (Signed)
Progress Note  Patient Name: Ryan [name] Date of Encounter: 08/21/2023  [facility] Cardiologist: None   Patient Profile     Subjective   Feeling good today.  Denies any shortness of breath or chest pain.  Sats 91% on room air 94% on 3 L nasal cannula.  Diuresed well yesterday putting out 1.375 L and is net -2.64 L since admission Inpatient Medications    Scheduled Meds:  aspirin EC  81 mg Oral Daily   digoxin  0.0625 mg Oral Daily   losartan  25 mg Oral Daily   metoprolol succinate  50 mg Oral Daily   sodium chloride flush  3 mL Intravenous Q12H   spironolactone  25 mg Oral Daily   torsemide  40 mg Oral BID   warfarin  7.5 mg Oral ONCE-1600   Warfarin - Pharmacist Dosing Inpatient   Does not apply q1600   Continuous Infusions:  heparin 1,500 Units/hr (08/21/23 0903)   PRN Meds: acetaminophen **OR** acetaminophen, guaiFENesin, hydrALAZINE, HYDROcodone-acetaminophen, ipratropium-albuterol, morphine injection, ondansetron (ZOFRAN) IV, senna-docusate, traZODone   Vital Signs    Vitals:   08/21/23 0058 08/21/23 0402 08/21/23 0404 08/21/23 0741  BP: 113/78 117/84  117/79  Pulse: 74 66  79  Resp:    17  Temp: 98.2 F (36.8 C) 98 F (36.7 C)  97.6 F (36.4 C)  TempSrc: Oral Oral  Oral  SpO2: 93% 91% 94% 91%  Weight:   (!) 153.2 kg   Height:        Intake/Output Summary (Last 24 hours) at 08/21/2023 0956 Last data filed at 08/21/2023 0740 Gross per 24 hour  Intake 984.27 ml  Output 1725 ml  Net -740.73 ml      08/21/2023    4:04 AM 08/20/2023    5:00 PM 08/18/2023    1:23 PM  Last 3 Weights  Weight (lbs) 337 lb 11.9 oz 338 lb 3 oz 345 lb 0.3 oz  Weight (kg) 153.2 kg 153.4 kg 156.5 kg      Telemetry    Normal sinus rhythm- Personally Reviewed  ECG    No new EKG to review- Personally Reviewed  Physical Exam   GEN: Well nourished, well developed in no acute distress morbid obesity HEENT: Normal NECK: cannot assess JVD due to body  habitus LYMPHATICS: No lymphadenopathy CARDIAC:RRR, no murmurs, rubs, gallops RESPIRATORY:  Clear to auscultation without rales, wheezing or rhonchi  ABDOMEN: Soft, non-tender, non-distended MUSCULOSKELETAL:  No edema; No deformity  SKIN: Warm and dry NEUROLOGIC:  Alert and oriented x 3 PSYCHIATRIC:  Normal affect  Labs    High Sensitivity Troponin:   Recent Labs  Lab 08/18/23 1327 08/18/23 1558  TROPONINIHS 77* 81*      Chemistry Recent Labs  Lab 08/18/23 1008 08/18/23 1327 08/18/23 1558 08/19/23 0213 08/20/23 0032 08/21/23 0506  NA  --    < >  --  137 135 135  K  --    < >  --  4.0 4.0 3.8  CL  --    < >  --  104 101 100  CO2  --    < >  --  24 23 23   GLUCOSE  --    < >  --  100* 101* 105*  BUN  --    < >  --  18 21* 29*  CREATININE  --    < >  --  1.41* 1.46* 1.56*  CALCIUM  --    < >  --  8.6* 8.7* 8.8*  PROT 7.8  --  7.3 7.4  --   --   ALBUMIN  --   --  3.5 3.5  --   --   AST  --   --  31 29  --   --   ALT  --   --  29 24  --   --   ALKPHOS  --   --  55 56  --   --   BILITOT  --   --  0.8 1.7*  --   --   GFRNONAA  --    < >  --  60* 57* 53*  ANIONGAP  --    < >  --  9 11 12    < > = values in this interval not displayed.     Hematology Recent Labs  Lab 08/19/23 0213 08/20/23 0032 08/21/23 0506  WBC 5.1 4.6 4.4  RBC 4.87 5.02 5.11  HGB 15.2 15.7 15.8  HCT 47.1 47.3 47.4  MCV 96.7 94.2 92.8  MCH 31.2 31.3 30.9  MCHC 32.3 33.2 33.3  RDW 14.5 14.2 14.1  PLT 244 233 260    BNP Recent Labs  Lab 08/19/23 0213  BNP 267.8*     DDimer No results for input(s): "DDIMER" in the last 168 hours.   CHA2DS2-VASc Score = 4   This indicates a 4.8% annual risk of stroke. The patient's score is based upon: CHF History: 1 HTN History: 1 Diabetes History: 1 Stroke History: 0 Vascular Disease History: 1 Age Score: 0 Gender Score: 0      Radiology    No results found.   Patient Profile     52 y.o. male  with an extensive clotting history with  recurrent PE status post mechanical thrombectomy after presenting with worsening shortness of breath 05/2023 and found to have persistent nonocclusive thrombus in the right main pulmonary artery as well as a new small left upper lobe segmental PE despite being compliant with Xarelto and warfarin.  He has a strong family history of clotting disorders.  This admission presented to the ER with 24 to 48 hours of worsening shortness of breath which came on suddenly and was diagnosed with stable chronic right sided PE and left-sided PE that was present prior had resolved.  There was evidence of right heart strain and TR.  Suprarenal IVC was in place.  Cardiology was asked to consult for RV failure  Assessment & Plan    Acute on chronic hypoxic respiratory failure & exacerbation of RV failure -On admission was volume overloaded with JVP of 12cm and mild pitting edema in bilateral lower extremities. His O2 requirement was also up again.[name]  -CTA PE with stable chronic right sided PE and resolution of prior left sided PE.  -Suspect his O2 requirement was due to volume overload in the setting of RV failure.  -Dig level <0.2 -doing much better with resolution of shortness of breath and lower extremity edema is back to baseline -He put out 1.375 L yesterday and is net - 2.64 L since admission.   -Serum creatinine slightly bumped from 1.46->>1.56 today with trending upward BUN and weight is down 8lbs since admission.  I think he has reached euvolemia. 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  -Bmet in a.m. and if serum creatinine continues to trend upward would decrease Demadex to 40 mg every morning and 20 mg every afternoon   Hypercoagulable disorder / recurrent PT - Continue warfarin with IV heparin bridge until therapeutic INR greater than 2 - INR today 1.7  - Continue IV heparin bridge until INR reaches 2->>2.5  I spent 35 minutes caring for this patient today face to face, ordering and reviewing labs, reviewing records from hospital notes from this admission, 2D echo 06/21/2023, chest CT 08/18/2023, seeing the patient, documenting in the record,  and adjusting heart failure meds.     For questions or updates, please contact [facility] Please consult [email or web address] for contact info under        Signed, Armanda Magic, MD  08/21/2023, 9:56 AM

## 2023-08-21 NOTE — Progress Notes (Signed)
PHARMACY - ANTICOAGULATION CONSULT NOTE  Pharmacy Consult for Heparin/warfarin bridge Indication:  PE/A.FIb/ DVT/ IVC filter and SVC filter  Allergies  Allergen Reactions   Metrizamide Other (See Comments)    Kidney failure requiring dialysis Kidney failure requiring dialysis    Clindamycin/Lincomycin Rash   Lincomycin Rash   Sulfa Antibiotics Rash    Patient Measurements: Height: 5\' 7"  (170.2 cm) Weight: (!) 153.2 kg (337 lb 11.9 oz) IBW/kg (Calculated) : 66.1 Heparin Dosing Weight: 104.8 kg  Vital Signs: Temp: 97.7 F (36.5 C) (12/22 1603) Temp Source: Oral (12/22 1603) BP: 133/88 (12/22 1603) Pulse Rate: 73 (12/22 1603)  Labs: Recent Labs     0000 08/18/23 2005 08/19/23 0213 08/19/23 0856 08/20/23 0032 08/21/23 0506 08/21/23 1203 08/21/23 1741  HGB   < >  --  15.2  --  15.7 15.8  --   --   HCT  --   --  47.1  --  47.3 47.4  --   --   PLT  --   --  244  --  233 260  --   --   APTT  --  27  --   --   --   --   --   --   LABPROT  --   --   --   --  18.2* 19.8*  --   --   INR  --   --   --   --  1.5* 1.7*  --   --   HEPARINUNFRC  --   --  0.64   < > 0.50 0.71* 0.52 0.51  CREATININE  --   --  1.41*  --  1.46* 1.56*  --   --    < > = values in this interval not displayed.    Estimated Creatinine Clearance: 78.2 mL/min (A) (by C-G formula based on SCr of 1.56 mg/dL (H)).   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  Regular the workup has not been forthcoming) previously followed by Dr. Isaiah Serge at [facility]   Hepatic steatosis    Hypertension    Morbid obesity with BMI  of 50.0-59.9, adult (HCC)    Oxygen deficiency    Peripheral vascular disease (HCC)    Presence of IVC filter    Has 2 IVC filters with significant thrombus burden superior to the filter.   Pulmonary embolus (HCC) 12/2016   Initially just treated with anticoagulation, but in February 2023 in April 2024 treated with EKOS thrombectomy for submassive PE.   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 On admission, patient's INR  was subtherapeutic at 1.6 (INR goal per Anticoagulation clinic note is 2.5-3.5). Home warfarin dose appears to be warfarin 5 mg daily as of 07/27/23 Anticoagulation Clinic note (patient's med rec is pending). At this time, patient had a therapeutic INR of 2.8 on this regimen. Last dose of warfarin was 12/18 PM. Pharmacy was consulted to transition patient to a heparin infusion for PE. CT Chest showed: Stable chronic right-sided PE. Left-sided PE present previously has resolved. CBC stable. Pt is volume overloaded.   Date INR Warfarin Dose  12/20 1.6 7.5 mg  12/21 1.5 7.5 mg   Goal of Therapy:  INR 2-3 Heparin level 0.3-0.7 units/ml Monitor platelets by anticoagulation protocol: Yes   Heparin Level:  Date HL Clinical Assessment  12/22@0506  HL = 0.71 Supratherapeutic   12/22@1203  HL = 0.52 Therapeutic x 1  12/22@1741  HL = 0.51 Therapeutic x 2       Plan:   Heparin Continue heparin gtt at 1,500 units/hr HL with AM labs  Check CBC and platelets daily  Warfarin:  INR is subtherapeutic. Will give warfarin 7.5 mg x 1 (~50%  increase from home dose). Daily INR. As the fluids comes off the patient, the INR will start to increase as well.   Ryan [name] PharmD, BCPS 08/21/2023 6:24 PM

## 2023-08-21 NOTE — Progress Notes (Signed)
PHARMACY CONSULT NOTE - ELECTROLYTES  Pharmacy Consult for Electrolyte Monitoring and Replacement   Recent Labs: Height: 5\' 7"  (170.2 cm) Weight: (!) 153.2 kg (337 lb 11.9 oz) IBW/kg (Calculated) : 66.1 Estimated Creatinine Clearance: 78.2 mL/min (A) (by C-G formula based on SCr of 1.56 mg/dL (H)). Potassium (mmol/L)  Date Value  08/21/2023 3.8  12/28/2014 3.8   Magnesium (mg/dL)  Date Value  03/47/4259 2.2  12/28/2014 2.7 (H)   Calcium (mg/dL)  Date Value  56/38/7564 8.8 (L)   Calcium, Total (mg/dL)  Date Value  33/29/5188 8.1 (L)   Albumin (g/dL)  Date Value  41/66/0630 3.5  07/04/2023 3.9  12/23/2014 3.9   Phosphorus (mg/dL)  Date Value  16/08/930 3.9  12/28/2014 6.1 (H)   Sodium (mmol/L)  Date Value  08/21/2023 135  07/04/2023 143  12/28/2014 130 (L)   Corrected Ca: 9.0 mg/dL  Assessment  Ryan [name] is a 52 y.o. male presenting with SHOB, CP. 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 Pharmacy has been consulted to monitor and replace electrolytes.  Diet: full liquid MIVF:  Pertinent medications: digoxin, spironolactone, losartan 25 mg daily, on torsemide 40 mg BID.    Goal of Therapy: Electrolytes WNL  Plan:  No replacement needed F/u with AM labs.   Thank you for allowing pharmacy to be a part of this patient's care.  Ronnald Ramp, PharmD Clinical Pharmacist 08/21/2023 8:27 AM

## 2023-08-21 NOTE — Progress Notes (Signed)
PROGRESS NOTE    Ryan [name]  VQQ:[id number] DOB: [birth year] DOA: 08/18/2023 PCP: Sherlene Shams, MD    Brief Narrative:  52 year old with history of morbid obesity, chronic lower extremity wounds, CHF with preserved EF, DM2, PE/DVT status post IVC on Coumadin since April 2024, subtherapeutic INR, failed Eliquis and Xarelto in the past, admitted for shortness of breath and chest pain.  Cardiology team consulted who recommended IV diuretics.  Patient was eventually transitioned to p.o. diuretics, INR is subtherapeutic therefore an IV heparin drip.   Assessment & Plan:  Active Problems:   Morbid obesity with BMI of 50.0-59.9, adult (HCC)   Chronic diastolic CHF (congestive heart failure) (HCC)   History of pulmonary embolism and DVT   Essential hypertension   Type 2 diabetes mellitus with diabetic chronic kidney disease (HCC)   CKD (chronic kidney disease), stage IIIa   Current use of long term anticoagulation   Paroxysmal atrial fibrillation (HCC)   SOB (shortness of breath)   Acute respiratory distress; multifactorial -Secondary to morbid obesity and poor compliance.  Diuretics per cardiology, currently on Demadex -Troponins are flat, procalcitonin negative.  Acute on chronic congestive heart failure with preserved EF with right-sided ventricular failure -Metoprolol, Aldactone.  Preserved EF but does have reduced RV function with TR. diuretics per cardiology  Diabetes mellitus type 2 -Last A1c 6.1.  Prediabetic range.  Does not appear to be any medications  History of pulmonary embolism/DVT status post IVC filter Chronic anticoagulation - CTA chest shows left-sided PE has resolved, on the right side has nonocclusive chronic PE which appears to be stable.  Continue Coumadin outpatient.  Currently on heparin drip due to subtherapeutic INR  Essential hypertension History of paroxysmal A-fib -On metoprolol, Aldactone, digoxin.  IV as needed -Heparin to Coumadin bridge.  INR  1.7  Morbid obesity with BMI greater than 50 -Needs to follow-up outpatient PCP and possible consider bariatric surgery    CKD stage IIIa -Cr is stable at 1.3   DVT prophylaxis: Heparin drip > Coumadin bridge Code Status: Full Family Communication:   Status is: Inpatient Remains inpatient appropriate because: Ongoing diuresis, heparin to Coumadin bridge.  Hopefully discharge once cleared by cardiology and INR is therapeutic    Subjective: Feeling better no complaints this morning.  Examination:  General exam: Appears calm and comfortable, morbid obesity Respiratory system: Clear to auscultation. Respiratory effort normal. Cardiovascular system: S1 & S2 heard, RRR. No JVD, murmurs, rubs, gallops or clicks. No pedal edema. Gastrointestinal system: Abdomen is nondistended, soft and nontender. No organomegaly or masses felt. Normal bowel sounds heard. Central nervous system: Alert and oriented. No focal neurological deficits. Extremities: Symmetric 5 x 5 power. Skin: Bilateral lower extremity chronic skin changes Psychiatry: Judgement and insight appear normal. Mood & affect appropriate.                Diet Orders (From admission, onward)     Start     Ordered   08/19/23 1535  Diet 2 gram sodium Room service appropriate? Yes; Fluid consistency: Thin  Diet effective now       Question Answer Comment  Room service appropriate? Yes   Fluid consistency: Thin      08/19/23 1534            Objective: Vitals:   08/21/23 0058 08/21/23 0402 08/21/23 0404 08/21/23 0741  BP: 113/78 117/84  117/79  Pulse: 74 66  79  Resp:    17  Temp: 98.2 F (36.8 C) 98  F (36.7 C)  97.6 F (36.4 C)  TempSrc: Oral Oral  Oral  SpO2: 93% 91% 94% 91%  Weight:   (!) 153.2 kg   Height:        Intake/Output Summary (Last 24 hours) at 08/21/2023 1123 Last data filed at 08/21/2023 0740 Gross per 24 hour  Intake 984.27 ml  Output 1725 ml  Net -740.73 ml   Filed Weights    08/18/23 1323 08/20/23 1700 08/21/23 0404  Weight: (!) 156.5 kg (!) 153.4 kg (!) 153.2 kg    Scheduled Meds:  aspirin EC  81 mg Oral Daily   digoxin  0.0625 mg Oral Daily   losartan  25 mg Oral Daily   metoprolol succinate  50 mg Oral Daily   sodium chloride flush  3 mL Intravenous Q12H   spironolactone  25 mg Oral Daily   torsemide  40 mg Oral BID   warfarin  7.5 mg Oral ONCE-1600   Warfarin - Pharmacist Dosing Inpatient   Does not apply q1600   Continuous Infusions:  heparin 1,500 Units/hr (08/21/23 0903)    Nutritional status     Body mass index is 52.9 kg/m.  Data Reviewed:   CBC: Recent Labs  Lab 08/18/23 1327 08/19/23 0213 08/20/23 0032 08/21/23 0506  WBC 5.1 5.1 4.6 4.4  HGB 16.3 15.2 15.7 15.8  HCT 51.1 47.1 47.3 47.4  MCV 97.1 96.7 94.2 92.8  PLT 281 244 233 260   Basic Metabolic Panel: Recent Labs  Lab 08/18/23 1327 08/19/23 0213 08/19/23 0457 08/20/23 0032 08/21/23 0506  NA 138 137  --  135 135  K 4.0 4.0  --  4.0 3.8  CL 104 104  --  101 100  CO2 23 24  --  23 23  GLUCOSE 118* 100*  --  101* 105*  BUN 18 18  --  21* 29*  CREATININE 1.60* 1.41*  --  1.46* 1.56*  CALCIUM 9.2 8.6*  --  8.7* 8.8*  MG  --   --  1.9 1.9 2.2  PHOS  --   --  3.9  --   --    GFR: Estimated Creatinine Clearance: 78.2 mL/min (A) (by C-G formula based on SCr of 1.56 mg/dL (H)). Liver Function Tests: Recent Labs  Lab 08/18/23 1008 08/18/23 1558 08/19/23 0213  AST  --  31 29  ALT  --  29 24  ALKPHOS  --  55 56  BILITOT  --  0.8 1.7*  PROT 7.8 7.3 7.4  ALBUMIN  --  3.5 3.5   No results for input(s): "LIPASE", "AMYLASE" in the last 168 hours. No results for input(s): "AMMONIA" in the last 168 hours. Coagulation Profile: Recent Labs  Lab 08/18/23 1327 08/20/23 0032 08/21/23 0506  INR 1.6* 1.5* 1.7*   Cardiac Enzymes: No results for input(s): "CKTOTAL", "CKMB", "CKMBINDEX", "TROPONINI" in the last 168 hours. BNP (last 3 results) No results for input(s):  "PROBNP" in the last 8760 hours. HbA1C: No results for input(s): "HGBA1C" in the last 72 hours. CBG: No results for input(s): "GLUCAP" in the last 168 hours. Lipid Profile: No results for input(s): "CHOL", "HDL", "LDLCALC", "TRIG", "CHOLHDL", "LDLDIRECT" in the last 72 hours. Thyroid Function Tests: Recent Labs    08/19/23 0213  TSH 3.471  FREET4 1.12   Anemia Panel: No results for input(s): "VITAMINB12", "FOLATE", "FERRITIN", "TIBC", "IRON", "RETICCTPCT" in the last 72 hours. Sepsis Labs: Recent Labs  Lab 08/18/23 2005 08/19/23 0213  PROCALCITON  --  <0.10  LATICACIDVEN 1.4  --     No results found for this or any previous visit (from the past 240 hours).       Radiology Studies: No results found.         LOS: 3 days   Time spent= 35 mins    Miguel Rota, MD [facility]  If 7PM-7AM, please contact night-coverage  08/21/2023, 11:23 AM

## 2023-08-21 NOTE — Progress Notes (Signed)
PHARMACY - ANTICOAGULATION CONSULT NOTE  Pharmacy Consult for Heparin/warfarin bridge Indication:  PE/A.FIb/ DVT/ IVC filter and SVC filter  Allergies  Allergen Reactions   Metrizamide Other (See Comments)    Kidney failure requiring dialysis Kidney failure requiring dialysis    Clindamycin/Lincomycin Rash   Lincomycin Rash   Sulfa Antibiotics Rash    Patient Measurements: Height: 5\' 7"  (170.2 cm) Weight: (!) 153.2 kg (337 lb 11.9 oz) IBW/kg (Calculated) : 66.1 Heparin Dosing Weight: 104.8 kg  Vital Signs: Temp: 98 F (36.7 C) (12/22 0402) Temp Source: Oral (12/22 0402) BP: 117/84 (12/22 0402) Pulse Rate: 66 (12/22 0402)  Labs: Recent Labs    08/18/23 1327 08/18/23 1327 08/18/23 1558 08/18/23 2005 08/19/23 0213 08/19/23 0856 08/20/23 0032 08/21/23 0506  HGB 16.3  --   --   --  15.2  --  15.7 15.8  HCT 51.1  --   --   --  47.1  --  47.3 47.4  PLT 281  --   --   --  244  --  233 260  APTT  --   --   --  27  --   --   --   --   LABPROT 19.6*  --   --   --   --   --  18.2* 19.8*  INR 1.6*  --   --   --   --   --  1.5* 1.7*  HEPARINUNFRC  --    < >  --   --  0.64 0.45 0.50 0.71*  CREATININE 1.60*  --   --   --  1.41*  --  1.46*  --   TROPONINIHS 77*  --  81*  --   --   --   --   --    < > = values in this interval not displayed.    Estimated Creatinine Clearance: 83.5 mL/min (A) (by C-G formula based on SCr of 1.46 mg/dL (H)).   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  Regular the workup has not been forthcoming) previously followed by Dr. Isaiah Serge at [facility]    Hepatic steatosis    Hypertension    Morbid obesity with BMI of 50.0-59.9, adult (HCC)    Oxygen deficiency    Peripheral vascular disease (HCC)    Presence of IVC filter    Has 2 IVC filters with significant thrombus burden superior to the filter.   Pulmonary embolus (HCC) 12/2016   Initially just treated with anticoagulation, but in February 2023 in April 2024 treated with EKOS thrombectomy for submassive PE.   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 On admission, patient's INR was subtherapeutic at 1.6 (INR goal per Anticoagulation clinic note is 2.5-3.5). Home warfarin dose appears to be warfarin 5 mg daily as of 07/27/23 Anticoagulation Clinic note (patient's med rec is pending). At this time, patient had a therapeutic INR of 2.8 on this regimen. Last dose of warfarin was 12/18 PM. Pharmacy was consulted to transition patient to a heparin infusion for PE. CT Chest showed: Stable chronic right-sided PE. Left-sided PE present previously has resolved. CBC stable. Pt is volume overloaded.   Date INR Warfarin Dose  12/20 1.6 7.5 mg  12/21 1.5 7.5 mg      Goal of Therapy:  INR 2-3 Heparin level 0.3-0.7 units/ml Monitor platelets by anticoagulation protocol: Yes   Plan:  12/22:  HL @ 0506 = 0.71, elevated - Will decrease heparin drip rate to 1500 units/hr and recheck HL 6 hrs after rate change.   Warfarin:  INR is subtherapeutic. Will give warfarin 7.5 mg x 1 (~50% increase from home dose). Daily INR. As the fluids comes off the patient, the  INR will start to increase as well.   Jacarra Bobak D Clinical Pharmacist 08/21/2023 6:01 AM

## 2023-08-21 NOTE — Progress Notes (Signed)
PHARMACY - ANTICOAGULATION CONSULT NOTE  Pharmacy Consult for Heparin/warfarin bridge Indication:  PE/A.FIb/ DVT/ IVC filter and SVC filter  Allergies  Allergen Reactions   Metrizamide Other (See Comments)    Kidney failure requiring dialysis Kidney failure requiring dialysis    Clindamycin/Lincomycin Rash   Lincomycin Rash   Sulfa Antibiotics Rash    Patient Measurements: Height: 5\' 7"  (170.2 cm) Weight: (!) 153.2 kg (337 lb 11.9 oz) IBW/kg (Calculated) : 66.1 Heparin Dosing Weight: 104.8 kg  Vital Signs: Temp: 97.7 F (36.5 C) (12/22 1204) Temp Source: Oral (12/22 1204) BP: 122/79 (12/22 1204) Pulse Rate: 76 (12/22 1204)  Labs: Recent Labs     0000 08/18/23 1558 08/18/23 2005 08/19/23 0213 08/19/23 0856 08/20/23 0032 08/21/23 0506 08/21/23 1203  HGB   < >  --   --  15.2  --  15.7 15.8  --   HCT  --   --   --  47.1  --  47.3 47.4  --   PLT  --   --   --  244  --  233 260  --   APTT  --   --  27  --   --   --   --   --   LABPROT  --   --   --   --   --  18.2* 19.8*  --   INR  --   --   --   --   --  1.5* 1.7*  --   HEPARINUNFRC  --   --   --  0.64   < > 0.50 0.71* 0.52  CREATININE  --   --   --  1.41*  --  1.46* 1.56*  --   TROPONINIHS  --  81*  --   --   --   --   --   --    < > = values in this interval not displayed.    Estimated Creatinine Clearance: 78.2 mL/min (A) (by C-G formula based on SCr of 1.56 mg/dL (H)).   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  Regular the workup has not been forthcoming) previously followed by Dr. Isaiah Serge at [facility]   Hepatic  steatosis    Hypertension    Morbid obesity with BMI of 50.0-59.9, adult (HCC)    Oxygen deficiency    Peripheral vascular disease (HCC)    Presence of IVC filter    Has 2 IVC filters with significant thrombus burden superior to the filter.   Pulmonary embolus (HCC) 12/2016   Initially just treated with anticoagulation, but in February 2023 in April 2024 treated with EKOS thrombectomy for submassive PE.   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 On admission, patient's INR was subtherapeutic at 1.6 (INR goal per Anticoagulation clinic note is 2.5-3.5). Home warfarin dose appears to be warfarin 5 mg daily as of 07/27/23 Anticoagulation Clinic note (patient's med rec is pending). At this time, patient had a therapeutic INR of 2.8 on this regimen. Last dose of warfarin was 12/18 PM. Pharmacy was consulted to transition patient to a heparin infusion for PE. CT Chest showed: Stable chronic right-sided PE. Left-sided PE present previously has resolved. CBC stable. Pt is volume overloaded.   Date INR Warfarin Dose  12/20 1.6 7.5 mg  12/21 1.5 7.5 mg   Goal of Therapy:  INR 2-3 Heparin level 0.3-0.7 units/ml Monitor platelets by anticoagulation protocol: Yes   Heparin Level:  Date HL Clinical Assessment  12/22@0506  HL = 0.71 Supratherapeutic   12/22@1203  HL = 0.52 Therapeutic x 1           Plan:   Heparin Continue heparin gtt at 1,500 units/hr Re-check HL in 6 hours  Check CBC and platelets daily  Warfarin:  INR is subtherapeutic. Will give  warfarin 7.5 mg x 1 (~50% increase from home dose). Daily INR. As the fluids comes off the patient, the INR will start to increase as well.   Effie Shy, PharmD Pharmacy Resident  08/21/2023 2:19 PM

## 2023-08-21 NOTE — Progress Notes (Signed)
PHARMACY - ANTICOAGULATION CONSULT NOTE  Pharmacy Consult for Heparin/warfarin bridge Indication:  PE/A.FIb/ DVT/ IVC filter and SVC filter  Allergies  Allergen Reactions   Metrizamide Other (See Comments)    Kidney failure requiring dialysis Kidney failure requiring dialysis    Clindamycin/Lincomycin Rash   Lincomycin Rash   Sulfa Antibiotics Rash    Patient Measurements: Height: 5\' 7"  (170.2 cm) Weight: (!) 153.2 kg (337 lb 11.9 oz) IBW/kg (Calculated) : 66.1 Heparin Dosing Weight: 104.8 kg  Vital Signs: Temp: 97.6 F (36.4 C) (12/22 0741) Temp Source: Oral (12/22 0741) BP: 117/79 (12/22 0741) Pulse Rate: 79 (12/22 0741)  Labs: Recent Labs    08/18/23 1327 08/18/23 1327 08/18/23 1558 08/18/23 2005 08/19/23 0213 08/19/23 0856 08/20/23 0032 08/21/23 0506  HGB 16.3  --   --   --  15.2  --  15.7 15.8  HCT 51.1  --   --   --  47.1  --  47.3 47.4  PLT 281  --   --   --  244  --  233 260  APTT  --   --   --  27  --   --   --   --   LABPROT 19.6*  --   --   --   --   --  18.2* 19.8*  INR 1.6*  --   --   --   --   --  1.5* 1.7*  HEPARINUNFRC  --    < >  --   --  0.64 0.45 0.50 0.71*  CREATININE 1.60*  --   --   --  1.41*  --  1.46* 1.56*  TROPONINIHS 77*  --  81*  --   --   --   --   --    < > = values in this interval not displayed.    Estimated Creatinine Clearance: 78.2 mL/min (A) (by C-G formula based on SCr of 1.56 mg/dL (H)).   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  Regular the workup has not been forthcoming) previously followed by Dr. Isaiah Serge at [facility]    Hepatic steatosis    Hypertension    Morbid obesity with BMI of 50.0-59.9, adult (HCC)    Oxygen deficiency    Peripheral vascular disease (HCC)    Presence of IVC filter    Has 2 IVC filters with significant thrombus burden superior to the filter.   Pulmonary embolus (HCC) 12/2016   Initially just treated with anticoagulation, but in February 2023 in April 2024 treated with EKOS thrombectomy for submassive PE.   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 On admission, patient's INR was subtherapeutic at 1.6 (INR goal per Anticoagulation clinic note is 2.5-3.5). Home warfarin dose appears to be warfarin 5 mg daily as of 07/27/23 Anticoagulation Clinic note (patient's med rec is pending). At this time, patient had a therapeutic INR of 2.8 on this regimen. Last dose of warfarin was 12/18 PM. Pharmacy was consulted to transition patient to a heparin infusion for PE. CT Chest showed: Stable chronic right-sided PE. Left-sided PE present previously has resolved. CBC stable. Pt is volume overloaded.   Date INR Warfarin Dose  12/20 1.6 7.5 mg  12/21 1.5 7.5 mg  12/22 1.7 7.5 mg      Goal of Therapy:  INR 2-3 Heparin level 0.3-0.7 units/ml Monitor platelets by anticoagulation protocol: Yes   Plan:  Heparin:  Heparin decreased to 1500 units/hr. Checking heparin level at 1200. CBC daily.    Warfarin:  INR is subtherapeutic. Will give warfarin 7.5 mg x 1 (~50% increase from home dose). Daily INR. As the fluids comes off the patient, the INR will start to  increase as well. Patient may need to be on 6-7.5 mg of warfarin at discharge. Patient presents at [facility] with a subtherapeutic INR multiple times in the last few months.    Paschal Dopp, PharmD Clinical Pharmacist 08/21/2023 8:28 AM

## 2023-08-22 ENCOUNTER — Other Ambulatory Visit (HOSPITAL_COMMUNITY): Payer: Self-pay

## 2023-08-22 ENCOUNTER — Telehealth (HOSPITAL_COMMUNITY): Payer: Self-pay | Admitting: Pharmacy Technician

## 2023-08-22 ENCOUNTER — Other Ambulatory Visit: Payer: Self-pay

## 2023-08-22 ENCOUNTER — Other Ambulatory Visit: Payer: Self-pay | Admitting: Emergency Medicine

## 2023-08-22 DIAGNOSIS — I5031 Acute diastolic (congestive) heart failure: Secondary | ICD-10-CM | POA: Diagnosis not present

## 2023-08-22 DIAGNOSIS — I50810 Right heart failure, unspecified: Secondary | ICD-10-CM

## 2023-08-22 DIAGNOSIS — I272 Pulmonary hypertension, unspecified: Secondary | ICD-10-CM | POA: Diagnosis not present

## 2023-08-22 LAB — CBC
HCT: 49.8 % (ref 39.0–52.0)
Hemoglobin: 16.3 g/dL (ref 13.0–17.0)
MCH: 30.5 pg (ref 26.0–34.0)
MCHC: 32.7 g/dL (ref 30.0–36.0)
MCV: 93.1 fL (ref 80.0–100.0)
Platelets: 254 10*3/uL (ref 150–400)
RBC: 5.35 MIL/uL (ref 4.22–5.81)
RDW: 14.3 % (ref 11.5–15.5)
WBC: 4.1 10*3/uL (ref 4.0–10.5)
nRBC: 0 % (ref 0.0–0.2)

## 2023-08-22 LAB — BASIC METABOLIC PANEL
Anion gap: 13 (ref 5–15)
BUN: 34 mg/dL — ABNORMAL HIGH (ref 6–20)
CO2: 23 mmol/L (ref 22–32)
Calcium: 9 mg/dL (ref 8.9–10.3)
Chloride: 102 mmol/L (ref 98–111)
Creatinine, Ser: 1.7 mg/dL — ABNORMAL HIGH (ref 0.61–1.24)
GFR, Estimated: 48 mL/min — ABNORMAL LOW (ref >60)
Glucose, Bld: 95 mg/dL (ref 70–99)
Potassium: 3.9 mmol/L (ref 3.5–5.1)
Sodium: 138 mmol/L (ref 135–145)

## 2023-08-22 LAB — MAGNESIUM: Magnesium: 2.1 mg/dL (ref 1.7–2.4)

## 2023-08-22 LAB — HEPARIN LEVEL (UNFRACTIONATED): Heparin Unfractionated: 0.44 [IU]/mL (ref 0.30–0.70)

## 2023-08-22 LAB — PROTIME-INR
INR: 2 — ABNORMAL HIGH (ref 0.8–1.2)
Prothrombin Time: 22.7 s — ABNORMAL HIGH (ref 11.4–15.2)

## 2023-08-22 MED ORDER — METOPROLOL SUCCINATE ER 25 MG PO TB24
25.0000 mg | ORAL_TABLET | Freq: Every day | ORAL | Status: DC
  Administered 2023-08-22 – 2023-08-23 (×2): 25 mg via ORAL
  Filled 2023-08-22 (×2): qty 1

## 2023-08-22 MED ORDER — TORSEMIDE 20 MG PO TABS
40.0000 mg | ORAL_TABLET | Freq: Every day | ORAL | Status: DC
  Administered 2023-08-23: 40 mg via ORAL
  Filled 2023-08-22: qty 2

## 2023-08-22 MED ORDER — ACETAMINOPHEN 325 MG PO TABS: 650.0000 mg | ORAL_TABLET | Freq: Four times a day (QID) | ORAL | Status: DC | PRN

## 2023-08-22 MED ORDER — WARFARIN SODIUM 7.5 MG PO TABS
7.5000 mg | ORAL_TABLET | Freq: Once | ORAL | Status: AC
  Administered 2023-08-22: 7.5 mg via ORAL
  Filled 2023-08-22: qty 1

## 2023-08-22 MED ORDER — TORSEMIDE 40 MG PO TABS: 40.0000 mg | ORAL_TABLET | Freq: Every day | ORAL | 0 refills | Status: DC

## 2023-08-22 MED ORDER — WARFARIN SODIUM 5 MG PO TABS: 7.5000 mg | ORAL_TABLET | Freq: Every day | ORAL | Status: DC

## 2023-08-22 MED ORDER — ACETAMINOPHEN 650 MG RE SUPP: 650.0000 mg | Freq: Four times a day (QID) | RECTAL | Status: DC | PRN

## 2023-08-22 NOTE — Progress Notes (Signed)
PHARMACY - ANTICOAGULATION CONSULT NOTE  Pharmacy Consult for Heparin/warfarin bridge Indication:  PE/A.FIb/ DVT/ IVC filter and SVC filter  Allergies  Allergen Reactions   Metrizamide Other (See Comments)    Kidney failure requiring dialysis Kidney failure requiring dialysis    Clindamycin/Lincomycin Rash   Lincomycin Rash   Sulfa Antibiotics Rash    Patient Measurements: Height: 5\' 7"  (170.2 cm) Weight: (!) 150.5 kg (331 lb 12.7 oz) IBW/kg (Calculated) : 66.1 Heparin Dosing Weight: 104.8 kg  Vital Signs: Temp: 97.7 F (36.5 C) (12/23 0331) Temp Source: Oral (12/23 0331) BP: 115/93 (12/23 0331) Pulse Rate: 71 (12/23 0331)  Labs: Recent Labs    08/20/23 0032 08/21/23 0506 08/21/23 1203 08/21/23 1741 08/22/23 0507  HGB 15.7 15.8  --   --  16.3  HCT 47.3 47.4  --   --  49.8  PLT 233 260  --   --  254  LABPROT 18.2* 19.8*  --   --  22.7*  INR 1.5* 1.7*  --   --  2.0*  HEPARINUNFRC 0.50 0.71* 0.52 0.51 0.44  CREATININE 1.46* 1.56*  --   --   --     Estimated Creatinine Clearance: 77.4 mL/min (A) (by C-G formula based on SCr of 1.56 mg/dL (H)).   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  Regular the workup has not been forthcoming) previously followed by Dr. Isaiah Serge at [facility]   Hepatic steatosis    Hypertension    Morbid obesity with BMI of 50.0-59.9, adult (HCC)    Oxygen deficiency    Peripheral vascular disease (HCC)    Presence of IVC filter    Has 2 IVC filters with significant thrombus burden superior to the filter.   Pulmonary embolus (HCC) 12/2016    Initially just treated with anticoagulation, but in February 2023 in April 2024 treated with EKOS thrombectomy for submassive PE.   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 On admission, patient's INR was subtherapeutic at 1.6 (INR goal per Anticoagulation clinic note is 2.5-3.5). Home warfarin dose appears to be warfarin 5 mg daily as of 07/27/23 Anticoagulation Clinic note (patient's med rec is pending). At this time, patient had a therapeutic INR of 2.8 on this regimen. Last dose of warfarin was 12/18 PM. Pharmacy was consulted to transition patient to a heparin infusion for PE. CT Chest showed: Stable chronic right-sided PE. Left-sided PE present previously has resolved. CBC stable. Pt is volume overloaded.   Date INR Warfarin Dose  12/20 1.6 7.5 mg  12/21 1.5 7.5 mg   Goal of Therapy:  INR 2-3  Heparin level 0.3-0.7 units/ml Monitor platelets by anticoagulation protocol: Yes   Heparin Level:  Date HL Clinical Assessment  12/22@0506  HL = 0.71 Supratherapeutic   12/22@1203  HL = 0.52 Therapeutic x 1  12/22@1741  HL = 0.51 Therapeutic x 2  12/23@0507  HL = 0.44 Therapeutic x 3   Plan:   Heparin Continue heparin gtt at 1,500 units/hr HL with AM labs  Check CBC and platelets daily  Warfarin:  INR is subtherapeutic. Will give warfarin 7.5 mg x 1 (~50% increase from home dose). Daily INR. As the fluids comes off the patient, the INR will start to increase as well.   Notnamed Scholz D 08/22/2023 6:54 AM

## 2023-08-22 NOTE — Telephone Encounter (Signed)
Patient Product/process development scientist completed.    The patient is insured through [facility]. Patient has [employer], may use a copay card, and/or apply for patient assistance if available.    Ran test claim for Jardiance 10 mg and the current 30 day co-pay is $0.00.  Ran test claim for Farxiga 10 mg and Non Formulary  This test claim was processed through [employer]- copay amounts may vary at other pharmacies due to [employer], or as the patient moves through the different stages of their insurance plan.     Roland Earl, CPHT Pharmacy Technician III Certified Patient Advocate [facility] Pharmacy Patient Advocate Team Direct Number: [phone]  Fax: [phone]

## 2023-08-22 NOTE — Progress Notes (Signed)
Progress Note    Ryan [name]  AVW:[id number] DOB: [birth year]  DOA: 08/18/2023 PCP: Sherlene Shams, MD      Brief Narrative:    Medical records reviewed and are as summarized below:  Ryan [name] is a 52 y.o. male with history of morbid obesity, chronic lower extremity wounds, CHF with preserved EF, DM2, PE/DVT status post IVC on Coumadin since April 2024, subtherapeutic INR, failed Eliquis and Xarelto in the past, admitted for shortness of breath and chest pain. Cardiology team consulted who recommended IV diuretics. Patient was eventually transitioned to p.o. diuretics, INR is subtherapeutic therefore an IV heparin drip.      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  (Morbid obesity)   Acute respiratory distress; multifactorial -Secondary to morbid obesity and medical nonadherence.   Previously treated with IV Lasix.  Now torsemide which has been decreased from 40 mg twice daily to 50 to 40 mg daily because of increasing creatinine. -Troponins are flat, procalcitonin negative.    Acute on chronic congestive heart failure with preserved EF with right-sided ventricular failure -Metoprolol, Aldactone.  Preserved EF but does have reduced RV function with TR. diuretics per cardiology   CKD stage IIIa:  Creatinine is going up.  Torsemide has been decreased from 40 mg twice daily to 40 mg daily.   Monitor BUN and creatinine.    Diabetes mellitus type 2 -Last A1c 6.1.  Prediabetic range.  Does not appear to be any medications    History of pulmonary embolism/DVT status post IVC filter Chronic anticoagulation - CTA chest shows left-sided PE has resolved, on the right side has nonocclusive chronic PE which appears to be stable. INR is therapeutic at 2.  Continue warfarin and discontinue IV heparin   Essential hypertension History of paroxysmal A-fib -On metoprolol, Aldactone, digoxin.  IV as needed On warfarin    Morbid obesity with BMI greater than 50 -Needs to follow-up outpatient PCP and possible consider bariatric surgery    Diet Order             Diet - low sodium heart healthy           Diet 2 gram sodium Room service appropriate? Yes; Fluid consistency: Thin  Diet effective now                            Consultants: Cardiologist  Procedures: None    Medications:    aspirin EC  81 mg Oral Daily   digoxin  0.0625 mg Oral Daily   losartan  25 mg Oral Daily   metoprolol succinate  25 mg Oral Daily   sodium chloride flush  3 mL Intravenous Q12H   spironolactone  25 mg Oral Daily   [START ON 08/23/2023] torsemide  40 mg Oral Daily   Warfarin - Pharmacist Dosing Inpatient   Does not apply  q1600   Continuous Infusions:  heparin 1,500 Units/hr (08/22/23 0155)     Anti-infectives (From admission, onward)    None              Family Communication/Anticipated D/C date and plan/Code Status   DVT prophylaxis:      Code Status: Full Code  Family Communication: None Disposition Plan: Plan to discharge home   Status is: Inpatient Remains inpatient appropriate because: CHF exacerbation, elevated creatinine       Subjective:   Interval events noted.  No shortness of breath or chest pain.  He feels fine and has no complaints.  Objective:    Vitals:   08/22/23 0331 08/22/23 0715 08/22/23 0755 08/22/23 1110  BP:  (!) 115/93 (!) 145/93 136/85 (!) 136/94  Pulse: 71 76 72 77  Resp: 18 17 16 18   Temp: 97.7 F (36.5 C) 98.2 F (36.8 C) 97.7 F (36.5 C) 99.1 F (37.3 C)  TempSrc: Oral Oral  Axillary  SpO2: 91% 91% 93% 94%  Weight: (!) 150.5 kg (!) 150.7 kg    Height:       No data found.   Intake/Output Summary (Last 24 hours) at 08/22/2023 1414 Last data filed at 08/22/2023 0700 Gross per 24 hour  Intake 68.75 ml  Output 1720 ml  Net -1651.25 ml   Filed Weights   08/21/23 0404 08/22/23 0331 08/22/23 0715  Weight: (!) 153.2 kg (!) 150.5 kg (!) 150.7 kg    Exam:  GEN: NAD SKIN: Warm and dry EYES: No pallor or icterus ENT: MMM CV: RRR PULM: CTA B ABD: soft, obese, NT, +BS CNS: AAO x 3, non focal EXT: No edema or tenderness        Data Reviewed:   I have personally reviewed following labs and imaging studies:  Labs: Labs show the following:   Basic Metabolic Panel: Recent Labs  Lab 08/18/23 1327 08/19/23 0213 08/19/23 0457 08/20/23 0032 08/21/23 0506 08/22/23 0507  NA 138 137  --  135 135 138  K 4.0 4.0  --  4.0 3.8 3.9  CL 104 104  --  101 100 102  CO2 23 24  --  23 23 23   GLUCOSE 118* 100*  --  101* 105* 95  BUN 18 18  --  21* 29* 34*  CREATININE 1.60* 1.41*  --  1.46* 1.56* 1.70*  CALCIUM 9.2 8.6*  --  8.7* 8.8* 9.0  MG  --   --  1.9 1.9 2.2 2.1  PHOS  --   --  3.9  --   --   --    GFR Estimated Creatinine Clearance: 71 mL/min (A) (by C-G formula based on SCr of 1.7 mg/dL (H)). Liver Function Tests: Recent Labs  Lab 08/18/23 1008 08/18/23 1558 08/19/23 0213  AST  --  31 29  ALT  --  29 24  ALKPHOS  --  55 56  BILITOT  --  0.8 1.7*  PROT 7.8 7.3 7.4  ALBUMIN  --  3.5 3.5   No results for input(s): "LIPASE", "AMYLASE" in the last 168 hours. No results for input(s): "AMMONIA" in the last 168 hours. Coagulation profile Recent Labs  Lab 08/18/23 1327 08/20/23 0032 08/21/23 0506 08/22/23 0507  INR 1.6* 1.5* 1.7* 2.0*    CBC: Recent Labs   Lab 08/18/23 1327 08/19/23 0213 08/20/23 0032 08/21/23 0506 08/22/23 0507  WBC 5.1 5.1 4.6 4.4 4.1  HGB 16.3 15.2 15.7 15.8 16.3  HCT 51.1 47.1 47.3  47.4 49.8  MCV 97.1 96.7 94.2 92.8 93.1  PLT 281 244 233 260 254   Cardiac Enzymes: No results for input(s): "CKTOTAL", "CKMB", "CKMBINDEX", "TROPONINI" in the last 168 hours. BNP (last 3 results) No results for input(s): "PROBNP" in the last 8760 hours. CBG: No results for input(s): "GLUCAP" in the last 168 hours. D-Dimer: No results for input(s): "DDIMER" in the last 72 hours. Hgb A1c: No results for input(s): "HGBA1C" in the last 72 hours. Lipid Profile: No results for input(s): "CHOL", "HDL", "LDLCALC", "TRIG", "CHOLHDL", "LDLDIRECT" in the last 72 hours. Thyroid function studies: No results for input(s): "TSH", "T4TOTAL", "T3FREE", "THYROIDAB" in the last 72 hours.  Invalid input(s): "FREET3" Anemia work up: No results for input(s): "VITAMINB12", "FOLATE", "FERRITIN", "TIBC", "IRON", "RETICCTPCT" in the last 72 hours. Sepsis Labs: Recent Labs  Lab 08/18/23 2005 08/19/23 0213 08/20/23 0032 08/21/23 0506 08/22/23 0507  PROCALCITON  --  <0.10  --   --   --   WBC  --  5.1 4.6 4.4 4.1  LATICACIDVEN 1.4  --   --   --   --     Microbiology No results found for this or any previous visit (from the past 240 hours).  Procedures and diagnostic studies:  No results found.             LOS: 4 days   Kyoko Elsea  [facility]   Pager on [email or web address]. If 7PM-7AM, please contact night-coverage at [email or web address]     08/22/2023, 2:14 PM

## 2023-08-22 NOTE — Consult Note (Addendum)
PHARMACY - ANTICOAGULATION CONSULT NOTE  Pharmacy Consult for Heparin/warfarin bridge Indication: PE/A.Fib/DVT/IVC filter and SVC filter  Allergies  Allergen Reactions   Metrizamide Other (See Comments)    Kidney failure requiring dialysis Kidney failure requiring dialysis    Clindamycin/Lincomycin Rash   Lincomycin Rash   Sulfa Antibiotics Rash    Patient Measurements: Height: 5\' 7"  (170.2 cm) Weight: (!) 150.7 kg (332 lb 3.7 oz) IBW/kg (Calculated) : 66.1 Heparin Dosing Weight: 104.8 kg  Vital Signs: Temp: 97.7 F (36.5 C) (12/23 0755) Temp Source: Oral (12/23 0715) BP: 136/85 (12/23 0755) Pulse Rate: 72 (12/23 0755)  Labs: Recent Labs    08/20/23 0032 08/21/23 0506 08/21/23 1203 08/21/23 1741 08/22/23 0507  HGB 15.7 15.8  --   --  16.3  HCT 47.3 47.4  --   --  49.8  PLT 233 260  --   --  254  LABPROT 18.2* 19.8*  --   --  22.7*  INR 1.5* 1.7*  --   --  2.0*  HEPARINUNFRC 0.50 0.71* 0.52 0.51 0.44  CREATININE 1.46* 1.56*  --   --  1.70*    Estimated Creatinine Clearance: 71 mL/min (A) (by C-G formula based on SCr of 1.7 mg/dL (H)).   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  Regular the workup has not been forthcoming) previously followed by Dr. Isaiah Serge at [facility]   Hepatic steatosis    Hypertension    Morbid obesity with BMI of 50.0-59.9, adult (HCC)    Oxygen deficiency    Peripheral vascular disease (HCC)    Presence of IVC filter    Has 2 IVC filters with significant thrombus burden superior to the filter.   Pulmonary embolus (HCC) 12/2016   Initially  just treated with anticoagulation, but in February 2023 in April 2024 treated with EKOS thrombectomy for submassive PE.   Ventricular trigeminy    Weakness of both lower limbs     Assessment: Patient is a 79 YOM with PMH of obesity (BMI 54.04), CHF, CKD, chronic lower extremity wounds, HFpEF, T2DM, chronic respiratory failure with hypoxia, recurrent PE/DVT post IVC x 2 who presented to the ED with SOB and chest pain. Patient has been on warfarin 5mg  daily since April 2024 (failed trials with Eliquis and Xarelto). On admission, patient's INR was subtherapeutic at 1.6 (INR goal per anticoagulation clinic note is 2.5-3.5). Patient is on heparin drip consulted by pharmacy to bridge until warfarin is therapeutic. CT Chest showed: Stable chronic right-sided PE. Left-sided PE present previously has resolved. 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  CBC is stable. No signs/symptoms of bleeding noted in chart, Hbg 16.3,  PLT 254.  Date INR Warfarin Dose  12/20 1.6 7.5 mg  12/21 1.5 7.5 mg  12/22 1.7 7.5 mg  12/23 2.0 7.5 mg    -1223 0507 HL 0.44 = therapeutic x 3  Goal of Therapy:  INR 2.5-3.5 Heparin level 0.3-0.7 units/ml Monitor platelets by anticoagulation protocol: Yes   Plan:  Heparin: -Continue heparin  1500 units/hr until INR > 2.5 then stop once therapeutic x 1 -Check HL in AM -Check CBC, platelets, signs/symptoms of bleeding daily  Warfarin: -INR is subtherapeutic. Will give warfarin 7.5mg  x 1 (50% increase from home dose) -Monitor INR in AM -As fluids come off patient INR will being to increase as well  Kipp Laurence, PharmD Candidate 08/22/2023,9:56 AM

## 2023-08-22 NOTE — Progress Notes (Signed)
    Advanced Heart Failure Rounding Note  PCP-Cardiologist: None   Subjective:     Feeling better this AM. Plan to hold diuretics today and restart orals tomorrow. Pending renal function can likely go then with outpatient follow up.    Objective:   Weight Range: (!) 150.7 kg Body mass index is 52.04 kg/m.   Vital Signs:   Temp:  [97.7 F (36.5 C)-98.2 F (36.8 C)] 97.7 F (36.5 C) (12/23 0755) Pulse Rate:  [71-76] 72 (12/23 0755) Resp:  [16-18] 16 (12/23 0755) BP: (110-145)/(79-93) 136/85 (12/23 0755) SpO2:  [91 %-95 %] 93 % (12/23 0755) Weight:  [150.5 kg-150.7 kg] 150.7 kg (12/23 0715) Last BM Date : 08/21/23  Weight change: Filed Weights   08/21/23 0404 08/22/23 0331 08/22/23 0715  Weight: (!) 153.2 kg (!) 150.5 kg (!) 150.7 kg    Intake/Output:   Intake/Output Summary (Last 24 hours) at 08/22/2023 1030 Last data filed at 08/22/2023 0700 Gross per 24 hour  Intake 68.75 ml  Output 1720 ml  Net -1651.25 ml      Physical Exam    General:  Well appearing.Morbid obesity.  HEENT: NCAT Cor:  RRR, mildly elevated JVP, systolic murmur Lungs: Normal work of breathing Abdomen: soft, nontender, nondistended Extremities: woody, chronic edema of the lower extremities  Neuro: alert & orientedx3, cranial nerves grossly intact. moves all 4 extremities w/o difficulty. Affect pleasant Vascular: 2+ radial pulses    Patient Profile   Ryan [name] is a 52 y.o. 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  Assessment/Plan   Acute on chronic hypoxic respiratory failure & exacerbation of RV failure: Volume status improved, can hold diuretics today and would resume 40mg  torsemide daily tomorrow.  - Continue losartan 25mg  daily - Decrease metoprolol to 25mg  daily - Torsemide 40mg   daily starting tomorrow - Conitnue digoxin 0.0625mg  daily - Will arrange for clinic follow up and RHC - Discharge likely tomorrow  Hypercoagulable disorder / recurrent PT: Can stop heparin today. - Continue warfarin.   Atrial flutter:  - Warfarin as above, needs sleep study  Length of Stay: 4  Ryan Minus, MD  08/22/2023, 10:30 AM  Advanced Heart Failure Team Pager [phone] (M-F; 7a - 5p)  Please contact [facility] for night-coverage after hours (5p -7a ) and weekends on [email or web address]

## 2023-08-22 NOTE — TOC Initial Note (Signed)
Transition of Care [facility]) - Initial/Assessment Note    Patient Details  Name: Ryan [name] MRN: [id number] Date of Birth: 24-Nov-1970  Transition of Care [facility]) CM/SW Contact:    Truddie Hidden, RN Phone Number: 08/22/2023, 2:33 PM  Clinical Narrative:                 RA completed 11/22 by this RNCM.   Patient admits from home. His nocturnal oxygen is supplied via [employer].  Spoke with patient regarding discharge plan. He drove himself to the hospital and will transport himself home. His PCP is Dr. Darrick Huntsman. He denied any DME needs at this time.           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 08/20/2023   S/P insertion of IVC (inferior vena caval) filter 08/20/2023   Diabetes mellitus treated with injections of non-insulin medication (HCC) 08/20/2023   Right ventricular failure due to disorder of pulmonary circulation (HCC) 08/20/2023   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/20/2023   Paroxysmal atrial fibrillation (HCC) 08/18/2023   SOB (shortness of breath) 08/18/2023   Current use of long term anticoagulation 07/17/2023   Pulmonary hypertension, unspecified (HCC) 06/18/2023   Cor pulmonale (chronic) (HCC) 05/16/2023   Other neutropenia (HCC) 01/19/2023   Anticoagulation goal of INR 2.5 to 3.5 12/24/2022   Rheumatoid factor positive 04/28/2022   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) 02/09/2022   Lymphadenopathy 02/09/2022   At risk for sleep apnea 01/27/2022   VTE (venous thromboembolism) 01/26/2022   Chronic diastolic CHF (congestive heart failure) (HCC) 01/04/2022   Clotting disorder (HCC) 10/19/2021   Angina at rest [facility]) 07/29/2021   Hyperlipidemia associated with type 2 diabetes mellitus (HCC) 07/29/2021   Atrial fibrillation with rapid ventricular response (HCC) 10/14/2020   Knee pain 03/11/2020   Weakness of both lower limbs 11/17/2017   Type 2 diabetes mellitus with diabetic chronic kidney disease (HCC) 08/03/2017   CKD (chronic kidney disease), stage IIIa 08/03/2017   Chronic pulmonary embolism (HCC) 01/03/2017   Acute diastolic CHF (congestive heart failure) (HCC) 12/15/2016   Essential hypertension 12/15/2016   PVC (premature ventricular contraction) 11/24/2016   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   Hepatic steatosis 05/30/2013   Morbid obesity with BMI of 50.0-59.9, adult (HCC) 05/30/2013   Proteinuria 05/30/2013   PCP:  Sherlene Shams, MD Pharmacy:   [facility] [id number] - [place], [place] - [address] AT [facility] OF [address]  [facility] [address] [place] [address] Phone: [phone] Fax: [phone]  [facility] [address] [place] [address] Phone: [phone] Fax: [phone]     Social Drivers of Health (SDOH) Social History: SDOH Screenings   Food Insecurity: No Food Insecurity (08/21/2023)  Housing: Low Risk  (08/21/2023)  Transportation Needs: No Transportation Needs (08/21/2023)  Utilities: Not At Risk (08/21/2023)  Depression (PHQ2-9): Low Risk  (08/18/2023)  Financial Resource Strain: Low Risk  (08/18/2023)  Physical Activity: Insufficiently Active (08/18/2023)  Social Connections: Moderately Integrated (08/18/2023)  Stress: No Stress Concern Present (08/18/2023)  Tobacco Use: Low Risk  (08/18/2023)   SDOH  Interventions:     Readmission Risk Interventions     No data to display

## 2023-08-22 NOTE — Progress Notes (Signed)
Referral to heart failure placed

## 2023-08-22 NOTE — Consult Note (Addendum)
PHARMACY CONSULT NOTE - ELECTROLYTES  Pharmacy Consult for Electrolyte Monitoring and Replacement   Recent Labs: Potassium (mmol/L)  Date Value  08/22/2023 3.9  12/28/2014 3.8   Magnesium (mg/dL)  Date Value  95/62/1308 2.1  12/28/2014 2.7 (H)   Calcium (mg/dL)  Date Value  65/78/4696 9.0   Calcium, Total (mg/dL)  Date Value  29/52/8413 8.1 (L)   Albumin (g/dL)  Date Value  24/40/1027 3.5  07/04/2023 3.9  12/23/2014 3.9   Phosphorus (mg/dL)  Date Value  25/36/6440 3.9  12/28/2014 6.1 (H)   Sodium (mmol/L)  Date Value  08/22/2023 138  07/04/2023 143  12/28/2014 130 (L)   Corrected Ca: 9.2 mg/dL  Height: 5\' 7"  (170.2 cm) Weight: (!) 150.7 kg (332 lb 3.7 oz) IBW/kg (Calculated) : 66.1 Estimated Creatinine Clearance: 71 mL/min (A) (by C-G formula based on SCr of 1.7 mg/dL (H)).  Assessment  Ryan [name] is a 52 y.o. male presenting with SOB and chest pain. PMH of obesity (BMI 54.04), CHF, CKD, chronic lower extremity wounds, HFpEF, T2DM, chronic respiratory failure with hypoxia, recurrent PE/DVT post IVC x 2, on warfarin since April 2024, subtherapeutic INR (failed trials of Eliquis and Xarelto). Scr is trending up, need to monitor diuretics. Pharmacy has been consulted to monitor and replace electrolytes.  Diet: full liquid MIVF: Pertinent medications: digoxin, spironolactone, losartan 25mg  daily, torsemide 40mg  BID > torsemide 40mg  daily  Goal of Therapy: Electrolytes within normal limits  Plan:  No replacement needed F/u with AM labs  Thank you for allowing pharmacy to be a part of this patient's care.  Kipp Laurence, PharmD Candidate 08/22/2023 11:02 AM

## 2023-08-23 ENCOUNTER — Other Ambulatory Visit: Payer: Self-pay | Admitting: Nurse Practitioner

## 2023-08-23 ENCOUNTER — Ambulatory Visit: Payer: No Typology Code available for payment source | Admitting: Physician Assistant

## 2023-08-23 ENCOUNTER — Other Ambulatory Visit: Payer: Self-pay | Admitting: *Deleted

## 2023-08-23 DIAGNOSIS — I5031 Acute diastolic (congestive) heart failure: Secondary | ICD-10-CM | POA: Diagnosis not present

## 2023-08-23 DIAGNOSIS — I5020 Unspecified systolic (congestive) heart failure: Secondary | ICD-10-CM

## 2023-08-23 DIAGNOSIS — I5033 Acute on chronic diastolic (congestive) heart failure: Secondary | ICD-10-CM | POA: Diagnosis not present

## 2023-08-23 LAB — BASIC METABOLIC PANEL
Anion gap: 14 (ref 5–15)
BUN: 40 mg/dL — ABNORMAL HIGH (ref 6–20)
CO2: 21 mmol/L — ABNORMAL LOW (ref 22–32)
Calcium: 8.9 mg/dL (ref 8.9–10.3)
Chloride: 99 mmol/L (ref 98–111)
Creatinine, Ser: 1.75 mg/dL — ABNORMAL HIGH (ref 0.61–1.24)
GFR, Estimated: 46 mL/min — ABNORMAL LOW (ref >60)
Glucose, Bld: 99 mg/dL (ref 70–99)
Potassium: 3.8 mmol/L (ref 3.5–5.1)
Sodium: 134 mmol/L — ABNORMAL LOW (ref 135–145)

## 2023-08-23 LAB — CBC
HCT: 48.3 % (ref 39.0–52.0)
Hemoglobin: 16.2 g/dL (ref 13.0–17.0)
MCH: 30.5 pg (ref 26.0–34.0)
MCHC: 33.5 g/dL (ref 30.0–36.0)
MCV: 90.8 fL (ref 80.0–100.0)
Platelets: 248 10*3/uL (ref 150–400)
RBC: 5.32 MIL/uL (ref 4.22–5.81)
RDW: 14.4 % (ref 11.5–15.5)
WBC: 4.1 10*3/uL (ref 4.0–10.5)
nRBC: 0 % (ref 0.0–0.2)

## 2023-08-23 LAB — MAGNESIUM: Magnesium: 2.1 mg/dL (ref 1.7–2.4)

## 2023-08-23 LAB — PROTIME-INR
INR: 2 — ABNORMAL HIGH (ref 0.8–1.2)
Prothrombin Time: 23 s — ABNORMAL HIGH (ref 11.4–15.2)

## 2023-08-23 MED ORDER — TORSEMIDE 20 MG PO TABS: 20.0000 mg | ORAL_TABLET | Freq: Every day | ORAL | 0 refills | Status: DC

## 2023-08-23 MED ORDER — POTASSIUM CHLORIDE CRYS ER 20 MEQ PO TBCR
20.0000 meq | EXTENDED_RELEASE_TABLET | Freq: Once | ORAL | Status: AC
  Administered 2023-08-23: 20 meq via ORAL
  Filled 2023-08-23: qty 1

## 2023-08-23 MED ORDER — WARFARIN SODIUM 7.5 MG PO TABS
7.5000 mg | ORAL_TABLET | Freq: Once | ORAL | Status: DC
  Filled 2023-08-23: qty 1

## 2023-08-23 NOTE — Progress Notes (Signed)
Rounding Note    Patient Name: Ryan [name] Date of Encounter: 08/23/2023  [facility] Cardiologist: None   Subjective   Patient has been followed by advanced heart failure team during admission. Reports that he is feeling back to baseline. Breathing and LE edema have improved.   Inpatient Medications    Scheduled Meds:  aspirin EC  81 mg Oral Daily   digoxin  0.0625 mg Oral Daily   losartan  25 mg Oral Daily   metoprolol succinate  25 mg Oral Daily   potassium chloride  20 mEq Oral Once   sodium chloride flush  3 mL Intravenous Q12H   spironolactone  25 mg Oral Daily   torsemide  40 mg Oral Daily   warfarin  7.5 mg Oral ONCE-1600   Warfarin - Pharmacist Dosing Inpatient   Does not apply q1600   Continuous Infusions:  PRN Meds: acetaminophen **OR** acetaminophen, guaiFENesin, hydrALAZINE, HYDROcodone-acetaminophen, ipratropium-albuterol, morphine injection, ondansetron (ZOFRAN) IV, senna-docusate, traZODone   Vital Signs    Vitals:   08/22/23 2024 08/22/23 2348 08/23/23 0414 08/23/23 0937  BP: 124/87 120/75 118/79 (!) 139/90  Pulse: 76 78 66 88  Resp: 20 20 20 18   Temp: 97.8 F (36.6 C) 97.9 F (36.6 C) 97.9 F (36.6 C) (!) 97.3 F (36.3 C)  TempSrc:      SpO2: 91% 93% 91% 97%  Weight:      Height:        Intake/Output Summary (Last 24 hours) at 08/23/2023 1051 Last data filed at 08/22/2023 1600 Gross per 24 hour  Intake --  Output 350 ml  Net -350 ml      08/22/2023    7:15 AM 08/22/2023    3:31 AM 08/21/2023    4:04 AM  Last 3 Weights  Weight (lbs) 332 lb 3.7 oz 331 lb 12.7 oz 337 lb 11.9 oz  Weight (kg) 150.7 kg 150.5 kg 153.2 kg      Telemetry    Sinus rhythm, rate 60-90 bpm - Personally Reviewed  Physical Exam   GEN: No acute distress.   Neck: JVD not well visualized Cardiac: RRR, 1/6 systolic murmur throughout, no rubs or gallops.  Respiratory: Clear to auscultation bilaterally. GI: Soft, nontender, non-distended  MS:  Chronic woody LE edema; No deformity. Neuro:  Nonfocal  Psych: Normal affect   Labs    High Sensitivity Troponin:   Recent Labs  Lab 08/18/23 1327 08/18/23 1558  TROPONINIHS 77* 81*     Chemistry Recent Labs  Lab 08/18/23 1008 08/18/23 1327 08/18/23 1558 08/19/23 0213 08/19/23 0457 08/21/23 0506 08/22/23 0507 08/23/23 0459  NA  --    < >  --  137   < > 135 138 134*  K  --    < >  --  4.0   < > 3.8 3.9 3.8  CL  --    < >  --  104   < > 100 102 99  CO2  --    < >  --  24   < > 23 23 21*  GLUCOSE  --    < >  --  100*   < > 105* 95 99  BUN  --    < >  --  18   < > 29* 34* 40*  CREATININE  --    < >  --  1.41*   < > 1.56* 1.70* 1.75*  CALCIUM  --    < >  --  8.6*   < > 8.8* 9.0 8.9  MG  --   --   --   --    < > 2.2 2.1 2.1  PROT 7.8  --  7.3 7.4  --   --   --   --   ALBUMIN  --   --  3.5 3.5  --   --   --   --   AST  --   --  31 29  --   --   --   --   ALT  --   --  29 24  --   --   --   --   ALKPHOS  --   --  55 56  --   --   --   --   BILITOT  --   --  0.8 1.7*  --   --   --   --   GFRNONAA  --    < >  --  60*   < > 53* 48* 46*  ANIONGAP  --    < >  --  9   < > 12 13 14    < > = values in this interval not displayed.    Lipids No results for input(s): "CHOL", "TRIG", "HDL", "LABVLDL", "LDLCALC", "CHOLHDL" in the last 168 hours.  Hematology Recent Labs  Lab 08/21/23 0506 08/22/23 0507 08/23/23 0459  WBC 4.4 4.1 4.1  RBC 5.11 5.35 5.32  HGB 15.8 16.3 16.2  HCT 47.4 49.8 48.3  MCV 92.8 93.1 90.8  MCH 30.9 30.5 30.5  MCHC 33.3 32.7 33.5  RDW 14.1 14.3 14.4  PLT 260 254 248   Thyroid  Recent Labs  Lab 08/19/23 0213  TSH 3.471  FREET4 1.12    BNP Recent Labs  Lab 08/19/23 0213  BNP 267.8*    DDimer No results for input(s): "DDIMER" in the last 168 hours.   Radiology    No results found.  Cardiac Studies   05/17/23 TTE 1. Left ventricular ejection fraction, by estimation, is 60 to 65%. The  left ventricle has normal function. The left ventricle  has no regional  wall motion abnormalities. There is mild asymmetric left ventricular  hypertrophy of the basal-septal segment.  Left ventricular diastolic parameters were normal. There is the  interventricular septum is flattened in systole, consistent with right  ventricular pressure overload.   2. Right ventricular systolic function is moderately reduced. The right  ventricular size is moderately enlarged. Tricuspid regurgitation signal is  inadequate for assessing PA pressure.   3. Right atrial size was moderately dilated.   4. The mitral valve is normal in structure. No evidence of mitral valve  regurgitation. No evidence of mitral stenosis.   5. The aortic valve is calcified. Aortic valve regurgitation is trivial.  Aortic valve sclerosis/calcification is present, without any evidence of  aortic stenosis.   Patient Profile     Ryan [name] is a 52 y.o. 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  Assessment & Plan    Acute on chronic hypoxic respiratory failure & exacerbation of RV failure - On admission was volume overloaded with JVP of 12cm and mild pitting edema in bilateral lower extremities. His O2 requirement was also up.  - CTA PE with stable chronic right sided PE and resolution of prior left sided PE -  Suspect his O2 requirement was due to volume overload in the setting of RV failure - Symptomatically improved with resolution of shortness of breath and lower extremity edema is back to baseline - Cr 1.7>>1.75 today, slight increase - IV diuretics held yesterday - Received one dose oral torsemide 40 mg daily today - Will hold diuretics at discharge due to up-trending creatinine with plan to recheck BMP this Thursday with further recommendations to follow - Continue metoprolol  succinate 25 mg daily (decreased yesterday), digoxin 0.0625 mg daily, spironolactone 25 mg daily, losartan 25 mg daily - Plan for close follow up with BMP 12/26, advanced heart failure clinic 1/6, and future RHC  Hypercoagulable disorder / recurrent PE - Heparin d/c'd yesterday - Continue warfarin  Atrial flutter:  - Warfarin as above, needs sleep study  For questions or updates, please contact [facility] Please consult [email or web address] for contact info under        Signed, Orion Crook, PA-C  08/23/2023, 10:51 AM

## 2023-08-23 NOTE — Discharge Summary (Signed)
Physician Discharge Summary   Patient: Ryan [name] MRN: [id number] DOB: [birth year]  Admit date:     08/18/2023  Discharge date: 08/23/23  Discharge Physician: Lurene Shadow   PCP: Sherlene Shams, MD   Recommendations at discharge:   With PCP in 1 week Check INR on Friday, 08/26/2023 Follow-up with cardiologist (office will call to schedule appointment).  Patient was advised to call if he does not hear from them.  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 male with history of morbid obesity, chronic lower extremity wounds, CHF with preserved EF, DM2, PE/DVT status post IVC on Coumadin since April 2024, subtherapeutic INR, failed Eliquis and Xarelto in the past, admitted for shortness of breath and chest pain. Cardiology team consulted who recommended IV diuretics. Patient was eventually transitioned to p.o. diuretics, INR is subtherapeutic therefore an IV heparin drip.     Assessment and Plan:   Acute hypoxic respiratory failure Improved.  Initially required up to 10 L/min oxygen via high flow nasal cannula.  He is tolerating room air.  Troponins are flat, procalcitonin negative.     Acute on chronic congestive heart failure with preserved EF Improved.  Previously treated with IV Lasix.   Torsemide has been decreased from 40 mg  daily to 20 mg daily. Metoprolol, Aldactone.  Preserved EF but does have reduced RV function with TR.     CKD stage IIIa:  Creatinine trended upward with diuretics that still within CKD stage IIIa range.  Torsemide decreased from 40 mg daily to 20 mg daily.  Close follow-up of BMP in the outpatient setting recommended.       Diabetes mellitus type 2 -Last A1c 6.1.  Prediabetic range.  Carb modified diet recommended     History of pulmonary embolism/DVT status post IVC filter Chronic anticoagulation - CTA chest shows left-sided PE has resolved, on the right side has nonocclusive chronic PE which appears to be stable. INR is therapeutic at 2.  Continue warfarin and monitor INR closely in the outpatient setting     Essential hypertension History of paroxysmal A-fib -On metoprolol, Aldactone, digoxin.  Continue warfarin     Morbid obesity with BMI greater than 50 -Needs to follow-up outpatient PCP and possible consider bariatric surgery     His condition has improved and is deemed stable for discharge to home today.       Consultants: Cardiologist Procedures performed: None Disposition: Home Diet recommendation:  Discharge Diet Orders (From admission, onward)     Start     Ordered   08/22/23 0000  Diet - low sodium heart healthy        08/22/23 1019           Cardiac and Carb modified diet DISCHARGE MEDICATION: Allergies as of 08/23/2023       Reactions   Metrizamide Other (See Comments)  Kidney failure requiring dialysis Kidney failure requiring dialysis   Clindamycin/lincomycin Rash   Lincomycin Rash   Sulfa Antibiotics Rash        Medication List     STOP taking these medications    ascorbic acid 500 MG tablet Commonly known as: VITAMIN C   furosemide 40 MG tablet Commonly known as: LASIX       TAKE these medications    albuterol 108 (90 Base) MCG/ACT inhaler Commonly known as: VENTOLIN HFA Inhale 2 puffs into the lungs every 6 (six)  hours as needed for wheezing or shortness of breath.   Digoxin 62.5 MCG Tabs Take 0.0625 mg by mouth daily.   losartan 25 MG tablet Commonly known as: COZAAR Take 1 tablet (25 mg total) by mouth daily.   metoprolol succinate 50 MG 24 hr tablet Commonly known as: TOPROL-XL Take 1 tablet (50 mg total) by mouth daily. Take with or immediately following a meal.   multivitamin with minerals Tabs tablet Take 1 tablet by mouth daily.   ondansetron 4 MG tablet Commonly known as: Zofran Take 1 tablet (4 mg total) by mouth every 8 (eight) hours as needed for nausea or vomiting.   OXYGEN Inhale 1 L into the lungs daily.   spironolactone 25 MG tablet Commonly known as: ALDACTONE Take 1 tablet (25 mg total) by mouth daily.   tirzepatide 7.5 MG/0.5ML Pen Commonly known as: MOUNJARO Inject 7.5 mg into the skin once a week.   torsemide 20 MG tablet Commonly known as: DEMADEX Take 1 tablet (20 mg total) by mouth daily. Start taking on: August 24, 2023   warfarin 5 MG tablet Commonly known as: COUMADIN Take as directed. If you are unsure how to take this medication, talk to your nurse or doctor. Original instructions: Take 1.5 tablets (7.5 mg total) by mouth daily. What changed: how much to take        Follow-up Information     Sabharwal, Aditya, DO. Schedule an appointment as soon as possible for a visit in 2 week(s).   Specialty: Cardiology Contact information: [address] [place] [address] [phone]                Discharge Exam: Ceasar Mons Weights   08/21/23 0404 08/22/23 0331 08/22/23 0715  Weight: (!) 153.2 kg (!) 150.5 kg (!) 150.7 kg   GEN: NAD SKIN: Warm and dry EYES: No pallor or icterus ENT: MMM CV: RRR PULM: CTA B ABD: soft, obese, NT, +BS CNS: AAO x 3, non focal EXT: Mild bilateral leg edema, no erythema or tenderness   Condition at discharge: good  The results of significant diagnostics from this hospitalization  (including imaging, microbiology, ancillary and laboratory) are listed below for reference.   Imaging Studies: CT Angio Chest PE W and/or Wo Contrast Result Date: 08/18/2023 CLINICAL DATA:  Follow up chronic PE.  SOB and chest pain. EXAM: CT ANGIOGRAPHY CHEST WITH CONTRAST TECHNIQUE: Multidetector CT imaging of the chest was performed using the standard protocol during bolus administration of intravenous contrast. Multiplanar CT image reconstructions and MIPs were obtained to evaluate the vascular anatomy. RADIATION DOSE REDUCTION: This exam was performed according to the departmental dose-optimization program which includes automated exposure control, adjustment of the mA and/or kV according to patient size and/or use of iterative reconstruction technique. CONTRAST:  OMNIPAQUE IOHEXOL 350 MG/ML SOLN COMPARISON:  06/14/2023. FINDINGS: Cardiovascular: Stable large chronic nonocclusive right sided pulmonary embolism. Left-sided PE present previously appears to have partially  resolved. Evidence of right heart strain and tricuspid regurgitation. No new PE. No pericardial effusion. There cardiomegaly. No aortic aneurysm. Mediastinum/Nodes: No enlarged mediastinal, hilar, or axillary lymph nodes. Thyroid gland, trachea, and esophagus demonstrate no significant findings. Lungs/Pleura: Lungs are clear. No pleural effusion or pneumothorax. Upper Abdomen: No acute abnormality.  Suprarenal IVC filter noted. Musculoskeletal: Thoracic degenerative changes. Review of the MIP images confirms the above findings. IMPRESSION: 1. Stable chronic right-sided PE. Left-sided PE present previously has resolved. 2. Right heart strain and tricuspid regurgitation. 3. Suprarenal IVC filter. Electronically Signed   By: [name] M.D.   On: 08/18/2023 17:27   DG Chest 2 View Result Date: 08/18/2023 CLINICAL DATA:  cp, sob EXAM: CHEST - 2 VIEW COMPARISON:  07/19/2023. FINDINGS: The heart size and mediastinal contours are  within normal limits. Both lungs are clear. No pneumothorax or pleural effusion. There are thoracic degenerative changes. IMPRESSION: No acute cardiopulmonary disease. Electronically Signed   By: [name] M.D.   On: 08/18/2023 17:21   Sleep Study Documents Result Date: 08/01/2023 Ordered by an unspecified provider.   Microbiology: Results for orders placed or performed during the hospital encounter of 06/14/23  Resp panel by RT-PCR (RSV, Flu A&B, Covid) Anterior Nasal Swab     Status: None   Collection Time: 06/14/23  6:32 AM   Specimen: Anterior Nasal Swab  Result Value Ref Range Status   SARS Coronavirus 2 by RT PCR NEGATIVE NEGATIVE Final    Comment: (NOTE) SARS-CoV-2 target nucleic acids are NOT DETECTED.  The SARS-CoV-2 RNA is generally detectable in upper respiratory specimens during the acute phase of infection. The lowest concentration of SARS-CoV-2 viral copies this assay can detect is 138 copies/mL. A negative result does not preclude SARS-Cov-2 infection and should not be used as the sole basis for treatment or other patient management decisions. A negative result may occur with  improper specimen collection/handling, submission of specimen other than nasopharyngeal swab, presence of viral mutation(s) within the areas targeted by this assay, and inadequate number of viral copies(<138 copies/mL). A negative result must be combined with clinical observations, patient history, and epidemiological information. The expected result is Negative.  Fact Sheet for Patients:  BloggerCourse.com  Fact Sheet for Healthcare Providers:  SeriousBroker.it  This test is no t yet approved or cleared by the [place] FDA and  has been authorized for detection and/or diagnosis of SARS-CoV-2 by FDA under an Emergency Use Authorization (EUA). This EUA will remain  in effect (meaning this test can be used) for the duration of  the COVID-19 declaration under Section 564(b)(1) of the Act, 21 U.S.C.section 360bbb-3(b)(1), unless the authorization is terminated  or revoked sooner.       Influenza A by PCR NEGATIVE NEGATIVE Final   Influenza B by PCR NEGATIVE NEGATIVE Final    Comment: (NOTE) The Xpert Xpress SARS-CoV-2/FLU/RSV plus assay is intended as an aid in the diagnosis of influenza from Nasopharyngeal swab specimens and should not be used as a sole basis for treatment. Nasal washings and aspirates are unacceptable for Xpert Xpress SARS-CoV-2/FLU/RSV testing.  Fact Sheet for Patients: BloggerCourse.com  Fact Sheet for Healthcare Providers: SeriousBroker.it  This test is not yet approved or cleared by the [place] FDA and has been authorized for detection and/or diagnosis of SARS-CoV-2 by FDA under an Emergency Use Authorization (EUA). This EUA will remain in effect (meaning this test can be used) for the duration of the COVID-19 declaration under Section 564(b)(1) of the Act, 21  U.S.C. section 360bbb-3(b)(1), unless the authorization is terminated or revoked.     Resp Syncytial Virus by PCR NEGATIVE NEGATIVE Final    Comment: (NOTE) Fact Sheet for Patients: BloggerCourse.com  Fact Sheet for Healthcare Providers: SeriousBroker.it  This test is not yet approved or cleared by the [place] FDA and has been authorized for detection and/or diagnosis of SARS-CoV-2 by FDA under an Emergency Use Authorization (EUA). This EUA will remain in effect (meaning this test can be used) for the duration of the COVID-19 declaration under Section 564(b)(1) of the Act, 21 U.S.C. section 360bbb-3(b)(1), unless the authorization is terminated or revoked.  Performed at [facility], [address] [place], [place] [address]   Blood Culture (routine x 2)     Status: None   Collection Time:  06/14/23  8:27 AM   Specimen: BLOOD  Result Value Ref Range Status   Specimen Description BLOOD RIGHT HAND  Final   Special Requests   Final    BOTTLES DRAWN AEROBIC AND ANAEROBIC Blood Culture adequate volume   Culture   Final    NO GROWTH 5 DAYS Performed at [facility], [address] [place], [place] [address]    Report Status 06/19/2023 FINAL  Final  Blood Culture (routine x 2)     Status: None   Collection Time: 06/14/23  8:27 AM   Specimen: BLOOD  Result Value Ref Range Status   Specimen Description BLOOD RIGHT ARM  Final   Special Requests   Final    BOTTLES DRAWN AEROBIC AND ANAEROBIC Blood Culture adequate volume   Culture   Final    NO GROWTH 5 DAYS Performed at [facility], [address] [place], [place] [address]    Report Status 06/19/2023 FINAL  Final  MRSA Next Gen by PCR, Nasal     Status: None   Collection Time: 06/14/23 10:45 AM   Specimen: Nasal Mucosa; Nasal Swab  Result Value Ref Range Status   MRSA by PCR Next Gen NOT DETECTED NOT DETECTED Final    Comment: (NOTE) The GeneXpert MRSA Assay (FDA approved for NASAL specimens only), is one component of a comprehensive MRSA colonization surveillance program. It is not intended to diagnose MRSA infection nor to guide or monitor treatment for MRSA infections. Test performance is not FDA approved in patients less than 28 years old. Performed at [facility], [address] [place], [place] [address]     Labs: CBC: Recent Labs  Lab 08/19/23 [phone] 08/20/23 0032 08/21/23 0506 08/22/23 0507 08/23/23 0459  WBC 5.1 4.6 4.4 4.1 4.1  HGB 15.2 15.7 15.8 16.3 16.2  HCT 47.1 47.3 47.4 49.8 48.3  MCV 96.7 94.2 92.8 93.1 90.8  PLT 244 233 260 254 248   Basic Metabolic Panel: Recent Labs  Lab 08/19/23 0213 08/19/23 0457 08/20/23 0032 08/21/23 0506 08/22/23 0507 08/23/23 0459  NA 137  --  135 135 138 134*  K 4.0  --  4.0 3.8 3.9 3.8  CL 104  --  101 100 102 99  CO2  24  --  23 23 23  21*  GLUCOSE 100*  --  101* 105* 95 99  BUN 18  --  21* 29* 34* 40*  CREATININE 1.41*  --  1.46* 1.56* 1.70* 1.75*  CALCIUM 8.6*  --  8.7* 8.8* 9.0 8.9  MG  --  1.9 1.9 2.2 2.1 2.1  PHOS  --  3.9  --   --   --   --  Liver Function Tests: Recent Labs  Lab 08/18/23 1008 08/18/23 1558 08/19/23 0213  AST  --  31 29  ALT  --  29 24  ALKPHOS  --  55 56  BILITOT  --  0.8 1.7*  PROT 7.8 7.3 7.4  ALBUMIN  --  3.5 3.5   CBG: No results for input(s): "GLUCAP" in the last 168 hours.  Discharge time spent: greater than 30 minutes.  Signed: Lurene Shadow, MD [facility] 08/23/2023

## 2023-08-23 NOTE — Plan of Care (Signed)

## 2023-08-23 NOTE — Consult Note (Signed)
PHARMACY - ANTICOAGULATION CONSULT NOTE  Pharmacy Consult for Heparin/warfarin bridge Indication: PE/A.Fib/DVT/IVC filter and SVC filter  Allergies  Allergen Reactions   Metrizamide Other (See Comments)    Kidney failure requiring dialysis Kidney failure requiring dialysis    Clindamycin/Lincomycin Rash   Lincomycin Rash   Sulfa Antibiotics Rash    Patient Measurements: Height: 5\' 7"  (170.2 cm) Weight: (!) 150.7 kg (332 lb 3.7 oz) IBW/kg (Calculated) : 66.1 Heparin Dosing Weight: 104.8 kg  Vital Signs: Temp: 97.9 F (36.6 C) (12/24 0414) BP: 118/79 (12/24 0414) Pulse Rate: 66 (12/24 0414)  Labs: Recent Labs    08/21/23 0506 08/21/23 1203 08/21/23 1741 08/22/23 0507 08/23/23 0459  HGB 15.8  --   --  16.3 16.2  HCT 47.4  --   --  49.8 48.3  PLT 260  --   --  254 248  LABPROT 19.8*  --   --  22.7* 23.0*  INR 1.7*  --   --  2.0* 2.0*  HEPARINUNFRC 0.71* 0.52 0.51 0.44  --   CREATININE 1.56*  --   --  1.70* 1.75*    Estimated Creatinine Clearance: 69 mL/min (A) (by C-G formula based on SCr of 1.75 mg/dL (H)).   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  Regular the workup has not been forthcoming) previously followed by [facility]. Isaiah Serge at [facility]   Hepatic steatosis    Hypertension    Morbid obesity with BMI of 50.0-59.9, adult (HCC)    Oxygen deficiency    Peripheral vascular disease (HCC)    Presence of IVC filter    Has 2 IVC filters with significant thrombus burden superior to the filter.   Pulmonary embolus (HCC) 12/2016   Initially just treated with  anticoagulation, but in February 2023 in April 2024 treated with EKOS thrombectomy for submassive PE.   Ventricular trigeminy    Weakness of both lower limbs     Assessment: Patient is a 41 YOM with PMH of obesity (BMI 54.04), CHF, CKD, chronic lower extremity wounds, HFpEF, T2DM, chronic respiratory failure with hypoxia, recurrent PE/DVT post IVC x 2 who presented to the ED with SOB and chest pain. Patient has been on warfarin 5mg  daily since April 2024 (failed trials with Eliquis and Xarelto). On admission, patient's INR was subtherapeutic at 1.6 (INR goal per anticoagulation clinic note is 2.5-3.5). Patient is on heparin drip consulted by pharmacy to bridge until warfarin is therapeutic. CT Chest showed: Stable chronic right-sided PE. Left-sided PE present previously has resolved. There may be a concern for underlying CTEPH that might be contributing to patient's symptoms.  CBC is stable. No signs/symptoms of bleeding noted in chart, Hbg 16.3,  PLT 254.  Date INR Warfarin Dose  12/20 1.6 7.5 mg  12/21 1.5 7.5 mg  12/22 1.7 7.5 mg  12/23 2.0 7.5 mg  12/24 2.0 7.5    -1223 0507 HL 0.44 = therapeutic x 3  Goal of Therapy:  INR 2.5-3.5 Heparin level 0.3-0.7 units/ml Monitor platelets by anticoagulation protocol: Yes   Plan:  Warfarin:  INR is subtherapeutic if goal is 2.5-3.5. Will give warfarin 7.5 mg x 1 (~50% increase from home dose). Daily INR. Heparin was stopped by hospitalist.    Discharge recommendations; 7.5 mg daily for a goal INR of 2.5-3.5. As patient presents at [facility] with a subtherapeutic INR multiple times in the last few months.   Ronnald Ramp, PharmD  08/23/2023,7:46 AM

## 2023-08-23 NOTE — Progress Notes (Signed)
PHARMACY CONSULT NOTE - ELECTROLYTES  Pharmacy Consult for Electrolyte Monitoring and Replacement   Recent Labs: Height: 5\' 7"  (170.2 cm) Weight: (!) 150.7 kg (332 lb 3.7 oz) IBW/kg (Calculated) : 66.1 Estimated Creatinine Clearance: 69 mL/min (A) (by C-G formula based on SCr of 1.75 mg/dL (H)). Potassium (mmol/L)  Date Value  08/23/2023 3.8  12/28/2014 3.8   Magnesium (mg/dL)  Date Value  57/84/6962 2.1  12/28/2014 2.7 (H)   Calcium (mg/dL)  Date Value  95/28/4132 8.9   Calcium, Total (mg/dL)  Date Value  44/08/270 8.1 (L)   Albumin (g/dL)  Date Value  53/66/4403 3.5  07/04/2023 3.9  12/23/2014 3.9   Phosphorus (mg/dL)  Date Value  47/42/5956 3.9  12/28/2014 6.1 (H)   Sodium (mmol/L)  Date Value  08/23/2023 134 (L)  07/04/2023 143  12/28/2014 130 (L)   Corrected Ca: 9.0 mg/dL  Assessment  Ryan [name] is a 52 y.o. male presenting with SHOB, CP. 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 Pharmacy has been consulted to monitor and replace electrolytes.  Diet: full liquid MIVF:  Pertinent medications: digoxin, spironolactone, losartan 25 mg daily, on torsemide 40 mg BID.    Goal of Therapy: Electrolytes WNL  Plan:  Kcl 20 mEq x 1. Electrolytes remain stable. Pharmacy will sign off. Please re-consult if needed.    Thank you for allowing pharmacy to be a part of this patient's care.  Ronnald Ramp, PharmD Clinical Pharmacist 08/23/2023 7:44 AM

## 2023-08-23 NOTE — Progress Notes (Signed)
 This RN provided discharge instructions and teaching to the patient. The patient verbalized and demonstrated understanding of the provided instructions. All outstanding questions resolved. L arm PIV removed. Cannula intact. Pt tolerated well. All belongings packed and in tow.

## 2023-08-25 ENCOUNTER — Telehealth: Payer: Self-pay

## 2023-08-25 LAB — IFE AND PE, RANDOM URINE
% BETA, Urine: 11.7 %
ALBUMIN, U: 73.7 %
ALPHA 1 URINE: 3.7 %
ALPHA-2-GLOBULIN, U: 4 %
GAMMA GLOBULIN URINE: 7 %
Protein, Ur: 260.5 mg/dL

## 2023-08-25 NOTE — Telephone Encounter (Signed)
-----   Message from Nurse Alana H sent at 08/22/2023 11:33 AM EST ----- Regarding: [facility] Precert  Test: Right heart catheterization  Insurance: [employer]  CPT: [phone] if known  Location: [facility]  Dx: Chronic diastolic heart failure  Provider: Elwyn Lade  Scheduled Date: Pending

## 2023-08-25 NOTE — Telephone Encounter (Signed)
NFAO#[id number]

## 2023-08-26 ENCOUNTER — Telehealth: Payer: Self-pay

## 2023-08-26 ENCOUNTER — Other Ambulatory Visit: Payer: Self-pay

## 2023-08-26 NOTE — Telephone Encounter (Signed)
The patient called in wanting to confirm a message he got from the scheduler. The patient stated that right now he is having problem with his heart.

## 2023-08-27 ENCOUNTER — Other Ambulatory Visit: Payer: Self-pay | Admitting: Internal Medicine

## 2023-08-27 LAB — BASIC METABOLIC PANEL
BUN/Creatinine Ratio: 18 (ref 9–20)
BUN: 27 mg/dL — ABNORMAL HIGH (ref 6–24)
CO2: 19 mmol/L — ABNORMAL LOW (ref 20–29)
Calcium: 9.3 mg/dL (ref 8.7–10.2)
Chloride: 104 mmol/L (ref 96–106)
Creatinine, Ser: 1.48 mg/dL — ABNORMAL HIGH (ref 0.76–1.27)
Glucose: 112 mg/dL — ABNORMAL HIGH (ref 70–99)
Potassium: 4.9 mmol/L (ref 3.5–5.2)
Sodium: 139 mmol/L (ref 134–144)
eGFR: 56 mL/min/{1.73_m2} — ABNORMAL LOW (ref >59)

## 2023-08-27 MED ORDER — EMPAGLIFLOZIN 10 MG PO TABS
10.0000 mg | ORAL_TABLET | Freq: Every day | ORAL | 1 refills | Status: DC
  Filled 2023-08-27: qty 30, 30d supply, fill #0

## 2023-08-28 ENCOUNTER — Other Ambulatory Visit: Payer: Self-pay

## 2023-08-29 ENCOUNTER — Other Ambulatory Visit: Payer: Self-pay

## 2023-08-29 ENCOUNTER — Telehealth: Payer: Self-pay

## 2023-08-29 DIAGNOSIS — I5020 Unspecified systolic (congestive) heart failure: Secondary | ICD-10-CM

## 2023-08-29 NOTE — Telephone Encounter (Addendum)
Reviewed time, date and isntructions for right heart catheterization with patient over phone. Patient verbalized understanding and agreement, and denied any questions or concerns at this time.  Instructions were also sent in a MyChart Message, patient aware.  [facility] [address] [place] [address] Dept: [phone]  Ryan [name]  08/29/2023  You are scheduled for a Cardiac Catheterization on Tuesday, January 21 with Dr. [name].  1. Please arrive at the [facility] Entrance of [facility], [address], [place] [address] at 6:30 AM (This is 1 hour(s) prior to your procedure time).  Proceed to the Check-In Desk directly inside the entrance.  Procedure Parking: Use the entrance off of the [address] side of the hospital. Turn right upon entering and follow the driveway to parking that is directly in front of the [facility]. There is no valet parking available at this entrance, however there is an awning directly in front of the [facility] Center for drop off/ pick up for patients.  Special note: Every effort is made to have your procedure done on time. Please understand that emergencies sometimes delay scheduled procedures.  2. Diet: Do not eat solid foods after midnight.  The patient may have clear liquids until 5am upon the day of the procedure.  3. Medication instructions in preparation for your procedure:   Stop taking Torsemide (Demadex) and Spironolactone the morning of your procedure.  Stop taking Tirzepatide Greggory Keen) 7 days prior to procedure. Restart medication on your normal dosing day after scheduled procedure.   Stop taking Jardiance 3 days prior to procedure.   On the morning of your procedure, take any morning medicines NOT listed above.  You may use sips of water.  5. Plan to go home the same day, you will only stay overnight  if medically necessary. 6. Bring a current list of your medications and current insurance cards. 7. You MUST have a responsible person to drive you home. 8. Someone MUST be with you the first 24 hours after you arrive home or your discharge will be delayed. 9. Please wear clothes that are easy to get on and off and wear slip-on shoes.  Thank you for allowing [place] to care for you!   -- [facility]

## 2023-08-30 ENCOUNTER — Telehealth: Payer: Self-pay

## 2023-08-30 ENCOUNTER — Ambulatory Visit: Payer: No Typology Code available for payment source | Attending: Cardiology

## 2023-08-30 NOTE — Telephone Encounter (Signed)
Lpmtcb to reschedule INR appt

## 2023-09-01 ENCOUNTER — Encounter: Payer: Self-pay | Admitting: Internal Medicine

## 2023-09-01 NOTE — Telephone Encounter (Signed)
Printed and placed in red folder 

## 2023-09-04 ENCOUNTER — Encounter: Payer: Self-pay | Admitting: Internal Medicine

## 2023-09-05 ENCOUNTER — Encounter: Payer: No Typology Code available for payment source | Admitting: Family

## 2023-09-12 ENCOUNTER — Telehealth (HOSPITAL_COMMUNITY): Payer: Self-pay | Admitting: *Deleted

## 2023-09-12 NOTE — Telephone Encounter (Signed)
 Pt is aware that forms have been completed. Pt requested we fax the forms to the number on the paperwork. I have faxed the forms.

## 2023-09-12 NOTE — Telephone Encounter (Signed)
 Called pt to remind of [facility] sch for 1/21, he is aware and states no questions at this time

## 2023-09-13 ENCOUNTER — Other Ambulatory Visit: Payer: Self-pay

## 2023-09-15 ENCOUNTER — Encounter: Payer: Self-pay | Admitting: Internal Medicine

## 2023-09-15 ENCOUNTER — Ambulatory Visit: Payer: No Typology Code available for payment source | Admitting: Internal Medicine

## 2023-09-15 ENCOUNTER — Telehealth: Payer: Self-pay | Admitting: *Deleted

## 2023-09-15 VITALS — BP 108/64 | HR 79 | Temp 97.6°F | Ht 67.0 in | Wt 348.6 lb

## 2023-09-15 DIAGNOSIS — Z7901 Long term (current) use of anticoagulants: Secondary | ICD-10-CM | POA: Diagnosis not present

## 2023-09-15 DIAGNOSIS — I999 Unspecified disorder of circulatory system: Secondary | ICD-10-CM

## 2023-09-15 DIAGNOSIS — I890 Lymphedema, not elsewhere classified: Secondary | ICD-10-CM

## 2023-09-15 DIAGNOSIS — E119 Type 2 diabetes mellitus without complications: Secondary | ICD-10-CM | POA: Diagnosis not present

## 2023-09-15 DIAGNOSIS — Z5181 Encounter for therapeutic drug level monitoring: Secondary | ICD-10-CM

## 2023-09-15 DIAGNOSIS — I50810 Right heart failure, unspecified: Secondary | ICD-10-CM | POA: Diagnosis not present

## 2023-09-15 DIAGNOSIS — Z6841 Body Mass Index (BMI) 40.0 and over, adult: Secondary | ICD-10-CM

## 2023-09-15 DIAGNOSIS — Z7985 Long-term (current) use of injectable non-insulin antidiabetic drugs: Secondary | ICD-10-CM

## 2023-09-15 DIAGNOSIS — I10 Essential (primary) hypertension: Secondary | ICD-10-CM

## 2023-09-15 DIAGNOSIS — I289 Disease of pulmonary vessels, unspecified: Secondary | ICD-10-CM

## 2023-09-15 LAB — PROTIME-INR
INR: 8.1 {ratio} (ref 0.8–1.0)
Prothrombin Time: 77.6 s (ref 9.6–13.1)

## 2023-09-15 MED ORDER — DIGOXIN 62.5 MCG PO TABS: 0.0625 mg | ORAL_TABLET | Freq: Every day | ORAL | 1 refills | Status: DC

## 2023-09-15 NOTE — Telephone Encounter (Signed)
Spoke to pt informed him of results pt stated that he has not had any antibiotics as of lately he has not seen any blood in stool or  urine  informed pt to hold off on the Wafarin for now . Pt verbalized understanding, labs have been ordered and appt is scheduled for 09/16/23

## 2023-09-15 NOTE — Telephone Encounter (Signed)
CRITICAL VALUE STICKER  CRITICAL VALUE: PT-77.6/INR-8.1  RECEIVER (on-site recipient of call):Silvestre Moment, CMA  DATE & TIME NOTIFIED: 09/15/23 @ 2:50pm  MESSENGER (representative from lab): Clydie Braun  MD NOTIFIED: Jason Coop in absence of Dr Darrick Huntsman  TIME OF NOTIFICATION: 2:57pm  RESPONSE:

## 2023-09-15 NOTE — Progress Notes (Signed)
Subjective:  Patient ID: Ryan [name], male    DOB: Jan 13, 1971  Age: 53 y.o. MRN: [id number]  CC: The primary encounter diagnosis was Current use of long term anticoagulation. 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   HPI Ryan [name] presents for  Chief Complaint  Patient presents with   Medical Management of Chronic Issues   MR Mcloughlin WAS RECENTLY HOSPITALIZED DROM DEC 19 TO DEC 24 FOR CHF. He was seen by me as a new patient earlier that day and was asymptomatic.  INR was subtherapeutic on admission.  He was diureses and warfarin level was increased from 5 , g daily to 7.5 mg daily.  He missed his INR check at the coumadin clinic last week. . HE WAS DISCHARGED ON REDUCED DOSE OF TORSEMIDE 20 MG) AND DIGOXIN WAS ADDED BUT HE IS NO LONGER TAKING IT DUE TO LACK OF REFILLS ON THE DISCHARGE MED.   Home weights have been  stable.   Right heart catheterization is scheduled for Jan 21   Type 2 DM with Morbid obesity:  he states that his appetite has been reduced with use of Mounjaro, but he has not lost weight.  He cites the reason being that he continues to eat "the wrong foods" because he likes them.  He denies cravings.  He was prescribed Jardiance after his first visit but he did not start the medicaton due to a history of prior trial causing  recurrent UTI's and sepsis.    Today we discussed the risks and benefits of bariatric surgery to reduce his weight and the comorbid conditions his weight is causing,  but he has no interest in surgery.  He believes that he is capable of losing a signficant amount of weight on his own but has not doen so because " I haven't  put my mind to it."    OSA:  He has not rescheduled  the sleep study that was aborted after 15 minutes due to respiratory failure and hospitalization in late November.  The study was ordered by his pulmonologist ; there is some evidence of nocturnal hypoxia but  he has not been wearing  supplemental oxygen most nights because he checks his room air saturation with a pulse oximeter and it is > 90%  .  He denies orthopnea unless he sleeps on his back. He is a side sleeper.    Recurrent DVT/PE: he reports compliance with warfarin dosing since discharge and denies any recent bleeding. His last INR was Dec 24 (3 weeks ago)  Outpatient Medications Prior to Visit  Medication Sig Dispense Refill   albuterol (VENTOLIN HFA) 108 (90 Base) MCG/ACT inhaler Inhale 2 puffs into the lungs every 6 (six) hours as needed for wheezing or shortness of breath. 8 g 2   losartan (COZAAR) 25 MG tablet Take 1 tablet (25 mg total) by mouth daily. 30 tablet 0   metoprolol succinate (TOPROL-XL) 50 MG 24 hr tablet Take 1 tablet (50 mg total) by mouth daily. Take with or immediately following a meal. 30 tablet 0   Multiple Vitamin (MULTIVITAMIN WITH MINERALS) TABS tablet Take 1 tablet by mouth daily. 30 tablet 0   ondansetron (ZOFRAN) 4 MG tablet Take 1 tablet (4 mg total) by mouth  every 8 (eight) hours as needed for nausea or vomiting. (Patient not taking: Reported on 09/14/2023) 20 tablet 0   OXYGEN Inhale 1 L into the lungs daily.     spironolactone (ALDACTONE) 25 MG tablet Take 1 tablet (25 mg total) by mouth daily. 30 tablet 0   tirzepatide (MOUNJARO) 7.5 MG/0.5ML Pen Inject 7.5 mg into the skin once a week. 2 mL 0   torsemide (DEMADEX) 20 MG tablet Take 1 tablet (20 mg total) by mouth daily. 30 tablet 0   warfarin (COUMADIN) 5 MG tablet Take 1.5 tablets (7.5 mg total) by mouth daily.     empagliflozin (JARDIANCE) 10 MG TABS tablet Take 1 tablet (10 mg total) by mouth daily before breakfast. (Patient not taking: Reported on 09/14/2023) 90 tablet 1   No facility-administered  medications prior to visit.    Review of Systems;  Patient denies headache, fevers, malaise, unintentional weight loss, skin rash, eye pain, sinus congestion and sinus pain, sore throat, dysphagia,  hemoptysis , cough, dyspnea, wheezing, chest pain, palpitations, orthopnea, edema, abdominal pain, nausea, melena, diarrhea, constipation, flank pain, dysuria, hematuria, urinary  Frequency, nocturia, numbness, tingling, seizures,  Focal weakness, Loss of consciousness,  Tremor, insomnia, depression, anxiety, and suicidal ideation.      Objective:  BP 108/64   Pulse 79   Temp 97.6 F (36.4 C) (Oral)   Ht 5\' 7"  (1.702 m)   Wt (!) 348 lb 9.6 oz (158.1 kg)   SpO2 98%   BMI 54.60 kg/m   BP Readings from Last 3 Encounters:  09/15/23 108/64  08/23/23 (!) 139/90  08/18/23 (!) 140/98    Wt Readings from Last 3 Encounters:  09/15/23 (!) 348 lb 9.6 oz (158.1 kg)  08/22/23 (!) 332 lb 3.7 oz (150.7 kg)  08/18/23 (!) 345 lb (156.5 kg)    Physical Exam Vitals reviewed.  Constitutional:      General: He is not in acute distress.    Appearance: Normal appearance. He is obese. He is not ill-appearing, toxic-appearing or diaphoretic.  HENT:     Head: Normocephalic.  Eyes:     General: No scleral icterus.       Right eye: No discharge.        Left eye: No discharge.     Conjunctiva/sclera: Conjunctivae normal.  Cardiovascular:     Rate and Rhythm: Normal rate and regular rhythm.     Heart sounds: Normal heart sounds.  Pulmonary:     Effort: Pulmonary effort is normal. No respiratory distress.     Breath sounds: Normal breath sounds.  Musculoskeletal:        General: Normal range of motion.     Cervical back: Normal range of motion.     Right lower leg: Pitting Edema present.     Left lower leg: 1+ Pitting Edema present.     Comments: Venous stasis changes bilaterally   Skin:    General: Skin is warm and dry.     Findings: Rash present.  Neurological:     General: No focal deficit  present.     Mental Status: He is alert and oriented to person, place, and time. Mental status is at baseline.  Psychiatric:        Mood and Affect: Mood normal.        Behavior: Behavior normal.        Thought Content: Thought content normal.        Judgment: Judgment normal.    Lab Results  Component  Value Date   HGBA1C 6.1 (H) 06/14/2023   HGBA1C 6.6 (H) 10/15/2022   HGBA1C 6.4 (H) 07/15/2022    Lab Results  Component Value Date   CREATININE 1.48 (H) 08/26/2023   CREATININE 1.75 (H) 08/23/2023   CREATININE 1.70 (H) 08/22/2023    Lab Results  Component Value Date   WBC 4.1 08/23/2023   HGB 16.2 08/23/2023   HCT 48.3 08/23/2023   PLT 248 08/23/2023   GLUCOSE 112 (H) 08/26/2023   CHOL 168 10/15/2022   TRIG 67 10/15/2022   HDL 41 10/15/2022   LDLCALC 114 (H) 10/15/2022   ALT 24 08/19/2023   AST 29 08/19/2023   NA 139 08/26/2023   K 4.9 08/26/2023   CL 104 08/26/2023   CREATININE 1.48 (H) 08/26/2023   BUN 27 (H) 08/26/2023   CO2 19 (L) 08/26/2023   TSH 3.471 08/19/2023   PSA 0.8 12/25/2014   INR 6.3 (HH) 09/16/2023   HGBA1C 6.1 (H) 06/14/2023   MICROALBUR 178.9 (H) 08/18/2023    CT Angio Chest PE W and/or Wo Contrast Result Date: 08/18/2023 CLINICAL DATA:  Follow up chronic PE.  SOB and chest pain. EXAM: CT ANGIOGRAPHY CHEST WITH CONTRAST TECHNIQUE: Multidetector CT imaging of the chest was performed using the standard protocol during bolus administration of intravenous contrast. Multiplanar CT image reconstructions and MIPs were obtained to evaluate the vascular anatomy. RADIATION DOSE REDUCTION: This exam was performed according to the departmental dose-optimization program which includes automated exposure control, adjustment of the mA and/or kV according to patient size and/or use of iterative reconstruction technique. CONTRAST:  OMNIPAQUE IOHEXOL 350 MG/ML SOLN COMPARISON:  06/14/2023. FINDINGS: Cardiovascular: Stable large chronic nonocclusive right sided  pulmonary embolism. Left-sided PE present previously appears to have partially resolved. Evidence of right heart strain and tricuspid regurgitation. No new PE. No pericardial effusion. There cardiomegaly. No aortic aneurysm. Mediastinum/Nodes: No enlarged mediastinal, hilar, or axillary lymph nodes. Thyroid gland, trachea, and esophagus demonstrate no significant findings. Lungs/Pleura: Lungs are clear. No pleural effusion or pneumothorax. Upper Abdomen: No acute abnormality.  Suprarenal IVC filter noted. Musculoskeletal: Thoracic degenerative changes. Review of the MIP images confirms the above findings. IMPRESSION: 1. Stable chronic right-sided PE. Left-sided PE present previously has resolved. 2. Right heart strain and tricuspid regurgitation. 3. Suprarenal IVC filter. Electronically Signed   By: [name] M.D.   On: 08/18/2023 17:27   DG Chest 2 View Result Date: 08/18/2023 CLINICAL DATA:  cp, sob EXAM: CHEST - 2 VIEW COMPARISON:  07/19/2023. FINDINGS: The heart size and mediastinal contours are within normal limits. Both lungs are clear. No pneumothorax or pleural effusion. There are thoracic degenerative changes. IMPRESSION: No acute cardiopulmonary disease. Electronically Signed   By: [name] M.D.   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  Resume digoxin,  medication refilled today.  Strongly urrged to follow up with his pulmonologist to reschedule his sleep study vs CPAP titration study to start managing his nocturnal hypoxia.  Right heart catheterization on Jan 21   Morbid obesity with BMI of 50.0-59.9, adult [facility]) Assessment & Plan: He has significant barriers to weight loss, including motivation and  limited understanding of the contribution of his weight to his medical conditions.   He was offered referral to bariatic surgery to discuss options  but has deferred due to lack of interest. Counselling given   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 Assessment & Plan:  Patient has not used his lympedema pumps in years .  Recent venous leg ulcers now healed.  Encouraged to resume use of lymphedema pumps and increase his ambulation    Diabetes mellitus treated with injections of non-insulin medication (HCC) Assessment & Plan: Continue current dose of  Mounjaro, increased recently to Advised to incresae Mounjaro dose to 7.5 mg weekly to continue weight loss  Lab Results  Component Value Date   HGBA1C 6.1 (H) 06/14/2023       Anticoagulation goal of INR 2.5 to 3.5 Assessment & Plan: Goal INR is 2.5 to 3 given recurrent thrombotic events with  INRs < 2.0 .  Dose was increased to 7.5 during last admission (dec 19) due to subtherapeutic INR  and INRappt was missed after discharge.  Warfarin has now been suspended until Sunday Jan 19 due to critical level which Is unaccompanied by bleeding.  He will resume 5 mg daily on Sunday jan 19 and return for INR on Monday jan 20  needs to be < 4 for right heart cath  on Jan 21  Lab Results  Component Value Date   INR 6.3 (HH) 09/16/2023   INR 8.1 Repeated and verified X2. (HH) 09/15/2023   INR 2.0 (H) 08/23/2023      Other orders -     Digoxin; Take 0.0625 mg by mouth daily.  Dispense: 90 tablet; Refill: 1     I provided 40 minutes on the day of this face-to-face encounter reviewing patient's last  hospitalization,  first visit with me, patient's  most recent visit with cardiology,  nephrology,  and pulmonology, ,  recent surgical and non surgical procedures, previous  labs and imaging studies, counseling on currently addressed issues,  and post visit ordering of diagnostics and therapeutics .   Follow-up: Return in about 3 months (around 12/14/2023).   Sherlene Shams, MD

## 2023-09-15 NOTE — Telephone Encounter (Signed)
LVM to call back to inform pt of results and also sent my chart message  Call pt  INR critically high, 8.1  Please ensure that he is doing okay not bleeding as a relates to a wound, nosebleed, blood in his stool or urine.  Has he had recent antibiotic?  I am collaborating with a colleague in the office however advised him not to take any warfarin until he has heard from [place].  I will advise him of the plan.  He will HOLD warfarin 7.5mg  today.

## 2023-09-15 NOTE — Patient Instructions (Addendum)
Please Contact Dr Larinda Buttery  your pulmonologist about finishing your sleep study .   You may need to return to have your settings determined   We are checking your INR today since you have not had it checked since Dec 24 on your new dose of 7.5 mg  Please start the digoxin ,  I have sent it to your pharmacy

## 2023-09-16 ENCOUNTER — Telehealth: Payer: Self-pay

## 2023-09-16 ENCOUNTER — Other Ambulatory Visit: Payer: Self-pay

## 2023-09-16 ENCOUNTER — Other Ambulatory Visit (INDEPENDENT_AMBULATORY_CARE_PROVIDER_SITE_OTHER): Payer: No Typology Code available for payment source

## 2023-09-16 ENCOUNTER — Telehealth: Payer: Self-pay | Admitting: *Deleted

## 2023-09-16 DIAGNOSIS — I10 Essential (primary) hypertension: Secondary | ICD-10-CM | POA: Diagnosis not present

## 2023-09-16 DIAGNOSIS — Z7985 Long-term (current) use of injectable non-insulin antidiabetic drugs: Secondary | ICD-10-CM

## 2023-09-16 DIAGNOSIS — Z7901 Long term (current) use of anticoagulants: Secondary | ICD-10-CM

## 2023-09-16 LAB — PROTIME-INR
INR: 6.3 {ratio} (ref 0.8–1.0)
Prothrombin Time: 61.4 s (ref 9.6–13.1)

## 2023-09-16 NOTE — Telephone Encounter (Signed)
Spoke to Ryan [name] informed him about note below, Ryan [name] verbalized understanding and  Ryan [name] stated that he is not having any nosebleeds no blood in urine or stool, Ryan [name] stated that he has a procedure on Jan 21st and he will call to schedule appt just in case he is not able to come on the 22nd   INR remains very high Please triage again and ensure no bleeding from nose, blood in urine, or in stool If any of the above were to occur, advise ED   I consulted with Dr Darrick Huntsman   Please advise him    To HOLD coumadin today and Saturday ( 09/17/23)      resume coumadin 5 mg daily on Sunday    Sch INR morning of 09/21/23,  Wednesday ; please order INR and sent to Dr Darrick Huntsman to co sign as she is managing   Let us know if he needs refill of coumadin as wel

## 2023-09-16 NOTE — Telephone Encounter (Signed)
Received disability paperwork from [place]. Placed in provider folder for review.

## 2023-09-16 NOTE — Telephone Encounter (Signed)
Noted INR rechecked 09/16/23

## 2023-09-16 NOTE — Telephone Encounter (Signed)
Call pt  INR remains very high Please triage again and ensure no bleeding from nose, blood in urine, or in stool If any of the above were to occur, advise ED  I consulted with Dr Darrick Huntsman  Please advise him   To HOLD coumadin today and Saturday ( 09/17/23)    resume coumadin 5 mg daily on Sunday   Sch INR morning of 09/21/23,  Wednesday ; please order INR and sent to Dr Darrick Huntsman to co sign as she is managing  Let us know if he needs refill of coumadin as well

## 2023-09-16 NOTE — Telephone Encounter (Signed)
CRITICAL VALUE STICKER  CRITICAL VALUE: PT- 61.4/INR 6.3  RECEIVER (on-site recipient of call): Silvestre Moment, CMA  DATE & TIME NOTIFIED: 09/16/23@ 10:55am  MESSENGER (representative from lab): Henderson Cloud  MD NOTIFIED: Jason Coop, NP  TIME OF NOTIFICATION: 11:00am  RESPONSE:  Placed in STAT book & on providers desk

## 2023-09-16 NOTE — Telephone Encounter (Signed)
LVM to call back to  inform pt of  below per Claris Che: [facility] team Call pt  He has heart cath procedure 09/20/23  Understandable if unable to come in on 09/21/23 ( weds)  As he is at increased risk for bleeding I want to ensure INR is trending down  Please sch lab for 09/19/23

## 2023-09-16 NOTE — Addendum Note (Signed)
Addended by: Warden Fillers on: 09/16/2023 08:18 AM   Modules accepted: Orders

## 2023-09-16 NOTE — Telephone Encounter (Signed)
Pt has scheduled appt for 09/19/23 for recheck of INR

## 2023-09-17 ENCOUNTER — Other Ambulatory Visit: Payer: Self-pay | Admitting: Internal Medicine

## 2023-09-17 DIAGNOSIS — T45515D Adverse effect of anticoagulants, subsequent encounter: Secondary | ICD-10-CM

## 2023-09-17 NOTE — Assessment & Plan Note (Signed)
Continue torsemide at 20 mg dose.  Resume digoxin,  medication refilled today.  Strongly urrged to follow up with his pulmonologist to reschedule his sleep study vs CPAP titration study to start managing his nocturnal hypoxia.  Right heart catheterization on Jan 21

## 2023-09-17 NOTE — Assessment & Plan Note (Signed)
Patient has not used his lympedema pumps in years .  Recent venous leg ulcers now healed.  Encouraged to resume use of lymphedema pumps and increase his ambulation

## 2023-09-17 NOTE — Assessment & Plan Note (Addendum)
He has significant barriers to weight loss, including motivation and  limited understanding of the contribution of his weight to his medical conditions.  He was offered referral to bariatic surgery to discuss options but has deferred due to lack of interest. Counselling given

## 2023-09-17 NOTE — Assessment & Plan Note (Signed)
Goal INR is 2.5 to 3 given recurrent thrombotic events with  INRs < 2.0 .  Dose was increased to 7.5 during last admission (dec 19) due to subtherapeutic INR  and INRappt was missed after discharge.  Warfarin has now been suspended until Sunday Jan 19 due to critical level which Is unaccompanied by bleeding.  He will resume 5 mg daily on Sunday jan 19 and return for INR on Monday jan 20  needs to be < 4 for right heart cath  on Jan 21  Lab Results  Component Value Date   INR 6.3 (HH) 09/16/2023   INR 8.1 Repeated and verified X2. (HH) 09/15/2023   INR 2.0 (H) 08/23/2023

## 2023-09-17 NOTE — Assessment & Plan Note (Signed)
Continue current dose of  Mounjaro, increased recently to Advised to incresae Mounjaro dose to 7.5 mg weekly to continue weight loss  Lab Results  Component Value Date   HGBA1C 6.1 (H) 06/14/2023

## 2023-09-18 ENCOUNTER — Encounter: Payer: Self-pay | Admitting: *Deleted

## 2023-09-19 ENCOUNTER — Other Ambulatory Visit (INDEPENDENT_AMBULATORY_CARE_PROVIDER_SITE_OTHER): Payer: No Typology Code available for payment source

## 2023-09-19 ENCOUNTER — Encounter: Payer: Self-pay | Admitting: Internal Medicine

## 2023-09-19 ENCOUNTER — Other Ambulatory Visit: Payer: Self-pay

## 2023-09-19 ENCOUNTER — Emergency Department: Payer: No Typology Code available for payment source

## 2023-09-19 DIAGNOSIS — Z8042 Family history of malignant neoplasm of prostate: Secondary | ICD-10-CM

## 2023-09-19 DIAGNOSIS — Z9981 Dependence on supplemental oxygen: Secondary | ICD-10-CM

## 2023-09-19 DIAGNOSIS — Z7984 Long term (current) use of oral hypoglycemic drugs: Secondary | ICD-10-CM

## 2023-09-19 DIAGNOSIS — T502X5A Adverse effect of carbonic-anhydrase inhibitors, benzothiadiazides and other diuretics, initial encounter: Secondary | ICD-10-CM | POA: Diagnosis present

## 2023-09-19 DIAGNOSIS — I5033 Acute on chronic diastolic (congestive) heart failure: Secondary | ICD-10-CM | POA: Diagnosis present

## 2023-09-19 DIAGNOSIS — Z888 Allergy status to other drugs, medicaments and biological substances status: Secondary | ICD-10-CM

## 2023-09-19 DIAGNOSIS — E1122 Type 2 diabetes mellitus with diabetic chronic kidney disease: Secondary | ICD-10-CM | POA: Diagnosis present

## 2023-09-19 DIAGNOSIS — E1151 Type 2 diabetes mellitus with diabetic peripheral angiopathy without gangrene: Secondary | ICD-10-CM | POA: Diagnosis present

## 2023-09-19 DIAGNOSIS — I878 Other specified disorders of veins: Secondary | ICD-10-CM | POA: Diagnosis present

## 2023-09-19 DIAGNOSIS — N1831 Chronic kidney disease, stage 3a: Secondary | ICD-10-CM | POA: Diagnosis present

## 2023-09-19 DIAGNOSIS — Z833 Family history of diabetes mellitus: Secondary | ICD-10-CM

## 2023-09-19 DIAGNOSIS — Y92009 Unspecified place in unspecified non-institutional (private) residence as the place of occurrence of the external cause: Secondary | ICD-10-CM

## 2023-09-19 DIAGNOSIS — T45515D Adverse effect of anticoagulants, subsequent encounter: Secondary | ICD-10-CM | POA: Diagnosis not present

## 2023-09-19 DIAGNOSIS — E7212 Methylenetetrahydrofolate reductase deficiency: Secondary | ICD-10-CM | POA: Diagnosis present

## 2023-09-19 DIAGNOSIS — I130 Hypertensive heart and chronic kidney disease with heart failure and stage 1 through stage 4 chronic kidney disease, or unspecified chronic kidney disease: Secondary | ICD-10-CM | POA: Diagnosis not present

## 2023-09-19 DIAGNOSIS — Z86718 Personal history of other venous thrombosis and embolism: Secondary | ICD-10-CM

## 2023-09-19 DIAGNOSIS — Z8249 Family history of ischemic heart disease and other diseases of the circulatory system: Secondary | ICD-10-CM

## 2023-09-19 DIAGNOSIS — Z803 Family history of malignant neoplasm of breast: Secondary | ICD-10-CM

## 2023-09-19 DIAGNOSIS — E872 Acidosis, unspecified: Secondary | ICD-10-CM | POA: Diagnosis present

## 2023-09-19 DIAGNOSIS — R000 Tachycardia, unspecified: Secondary | ICD-10-CM | POA: Diagnosis present

## 2023-09-19 DIAGNOSIS — G4733 Obstructive sleep apnea (adult) (pediatric): Secondary | ICD-10-CM | POA: Diagnosis present

## 2023-09-19 DIAGNOSIS — I4892 Unspecified atrial flutter: Secondary | ICD-10-CM | POA: Diagnosis present

## 2023-09-19 DIAGNOSIS — R042 Hemoptysis: Secondary | ICD-10-CM | POA: Diagnosis not present

## 2023-09-19 DIAGNOSIS — J9621 Acute and chronic respiratory failure with hypoxia: Secondary | ICD-10-CM | POA: Diagnosis present

## 2023-09-19 DIAGNOSIS — Z7901 Long term (current) use of anticoagulants: Secondary | ICD-10-CM | POA: Diagnosis not present

## 2023-09-19 DIAGNOSIS — Z7985 Long-term (current) use of injectable non-insulin antidiabetic drugs: Secondary | ICD-10-CM

## 2023-09-19 DIAGNOSIS — Z881 Allergy status to other antibiotic agents status: Secondary | ICD-10-CM

## 2023-09-19 DIAGNOSIS — Z6841 Body Mass Index (BMI) 40.0 and over, adult: Secondary | ICD-10-CM

## 2023-09-19 DIAGNOSIS — R0602 Shortness of breath: Secondary | ICD-10-CM | POA: Diagnosis not present

## 2023-09-19 DIAGNOSIS — I2724 Chronic thromboembolic pulmonary hypertension: Secondary | ICD-10-CM | POA: Diagnosis present

## 2023-09-19 DIAGNOSIS — Z882 Allergy status to sulfonamides status: Secondary | ICD-10-CM

## 2023-09-19 DIAGNOSIS — Z79899 Other long term (current) drug therapy: Secondary | ICD-10-CM

## 2023-09-19 DIAGNOSIS — I2782 Chronic pulmonary embolism: Secondary | ICD-10-CM | POA: Diagnosis present

## 2023-09-19 DIAGNOSIS — I200 Unstable angina: Secondary | ICD-10-CM | POA: Diagnosis present

## 2023-09-19 DIAGNOSIS — Z91148 Patient's other noncompliance with medication regimen for other reason: Secondary | ICD-10-CM

## 2023-09-19 DIAGNOSIS — I4891 Unspecified atrial fibrillation: Secondary | ICD-10-CM | POA: Diagnosis present

## 2023-09-19 DIAGNOSIS — Z95828 Presence of other vascular implants and grafts: Secondary | ICD-10-CM

## 2023-09-19 DIAGNOSIS — T45516A Underdosing of anticoagulants, initial encounter: Secondary | ICD-10-CM | POA: Diagnosis present

## 2023-09-19 DIAGNOSIS — R059 Cough, unspecified: Secondary | ICD-10-CM | POA: Diagnosis present

## 2023-09-19 DIAGNOSIS — I5082 Biventricular heart failure: Secondary | ICD-10-CM | POA: Diagnosis present

## 2023-09-19 LAB — CBC WITH DIFFERENTIAL/PLATELET
Abs Immature Granulocytes: 0.01 10*3/uL (ref 0.00–0.07)
Basophils Absolute: 0 10*3/uL (ref 0.0–0.1)
Basophils Relative: 1 %
Eosinophils Absolute: 0.1 10*3/uL (ref 0.0–0.5)
Eosinophils Relative: 2 %
HCT: 49.3 % (ref 39.0–52.0)
Hemoglobin: 15.1 g/dL (ref 13.0–17.0)
Immature Granulocytes: 0 %
Lymphocytes Relative: 47 %
Lymphs Abs: 2.3 10*3/uL (ref 0.7–4.0)
MCH: 30.6 pg (ref 26.0–34.0)
MCHC: 30.6 g/dL (ref 30.0–36.0)
MCV: 99.8 fL (ref 80.0–100.0)
Monocytes Absolute: 0.6 10*3/uL (ref 0.1–1.0)
Monocytes Relative: 13 %
Neutro Abs: 1.8 10*3/uL (ref 1.7–7.7)
Neutrophils Relative %: 37 %
Platelets: 234 10*3/uL (ref 150–400)
RBC: 4.94 MIL/uL (ref 4.22–5.81)
RDW: 14.6 % (ref 11.5–15.5)
WBC: 4.8 10*3/uL (ref 4.0–10.5)
nRBC: 0 % (ref 0.0–0.2)

## 2023-09-19 LAB — PROTIME-INR
INR: 1.7 {ratio} — ABNORMAL HIGH (ref 0.8–1.0)
INR: 1.3 — ABNORMAL HIGH (ref 0.8–1.2)
Prothrombin Time: 17.7 s — ABNORMAL HIGH (ref 9.6–13.1)
Prothrombin Time: 16.7 s — ABNORMAL HIGH (ref 11.4–15.2)

## 2023-09-19 LAB — BRAIN NATRIURETIC PEPTIDE: B Natriuretic Peptide: 280.4 pg/mL — ABNORMAL HIGH (ref 0.0–100.0)

## 2023-09-19 LAB — TROPONIN I (HIGH SENSITIVITY): Troponin I (High Sensitivity): 68 ng/L — ABNORMAL HIGH (ref <18)

## 2023-09-19 NOTE — ED Triage Notes (Addendum)
Pt arrives via POV with CC of SOB for the last 1.5 days. Pt reports sharp pain with deep inspiration. Last dose of blood thinner at 2345 on 01/19 (approx 22 hours ago). Pt has hx of 12 PE. Pt scheduled for cardiac cath tomorrow and was "trying to hold out for the procedure" but SOB has gotten worse and does not subside with O2 use at home. Pt ambulatory at this time.

## 2023-09-20 ENCOUNTER — Inpatient Hospital Stay
Admission: EM | Admit: 2023-09-20 | Discharge: 2023-09-30 | DRG: 286 | Disposition: A | Discharge status: Home Health | Payer: No Typology Code available for payment source | Attending: Internal Medicine | Admitting: Internal Medicine

## 2023-09-20 ENCOUNTER — Other Ambulatory Visit: Payer: Self-pay

## 2023-09-20 ENCOUNTER — Encounter: Payer: Self-pay | Admitting: Internal Medicine

## 2023-09-20 ENCOUNTER — Ambulatory Visit
Admission: RE | Admit: 2023-09-20 | Payer: No Typology Code available for payment source | Source: Home / Self Care | Admitting: Cardiology

## 2023-09-20 ENCOUNTER — Inpatient Hospital Stay (HOSPITAL_COMMUNITY)
Admit: 2023-09-20 | Discharge: 2023-09-20 | Disposition: A | Discharge status: Home / Self Care | Payer: No Typology Code available for payment source | Attending: Physician Assistant | Admitting: Physician Assistant

## 2023-09-20 ENCOUNTER — Emergency Department: Payer: No Typology Code available for payment source

## 2023-09-20 ENCOUNTER — Encounter
Admission: EM | Disposition: A | Discharge status: Home Health | Payer: Self-pay | Source: Home / Self Care | Attending: Internal Medicine

## 2023-09-20 DIAGNOSIS — Z7985 Long-term (current) use of injectable non-insulin antidiabetic drugs: Secondary | ICD-10-CM

## 2023-09-20 DIAGNOSIS — Z7901 Long term (current) use of anticoagulants: Secondary | ICD-10-CM | POA: Diagnosis not present

## 2023-09-20 DIAGNOSIS — I825Z3 Chronic embolism and thrombosis of unspecified deep veins of distal lower extremity, bilateral: Secondary | ICD-10-CM | POA: Diagnosis not present

## 2023-09-20 DIAGNOSIS — I5022 Chronic systolic (congestive) heart failure: Secondary | ICD-10-CM

## 2023-09-20 DIAGNOSIS — R7989 Other specified abnormal findings of blood chemistry: Secondary | ICD-10-CM | POA: Diagnosis present

## 2023-09-20 DIAGNOSIS — R0600 Dyspnea, unspecified: Secondary | ICD-10-CM | POA: Diagnosis present

## 2023-09-20 DIAGNOSIS — N1831 Chronic kidney disease, stage 3a: Secondary | ICD-10-CM | POA: Diagnosis present

## 2023-09-20 DIAGNOSIS — R079 Chest pain, unspecified: Secondary | ICD-10-CM

## 2023-09-20 DIAGNOSIS — E7212 Methylenetetrahydrofolate reductase deficiency: Secondary | ICD-10-CM | POA: Diagnosis present

## 2023-09-20 DIAGNOSIS — I200 Unstable angina: Secondary | ICD-10-CM

## 2023-09-20 DIAGNOSIS — R0602 Shortness of breath: Principal | ICD-10-CM

## 2023-09-20 DIAGNOSIS — E119 Type 2 diabetes mellitus without complications: Secondary | ICD-10-CM

## 2023-09-20 DIAGNOSIS — T502X5A Adverse effect of carbonic-anhydrase inhibitors, benzothiadiazides and other diuretics, initial encounter: Secondary | ICD-10-CM | POA: Diagnosis present

## 2023-09-20 DIAGNOSIS — R570 Cardiogenic shock: Secondary | ICD-10-CM | POA: Diagnosis present

## 2023-09-20 DIAGNOSIS — R791 Abnormal coagulation profile: Secondary | ICD-10-CM | POA: Diagnosis not present

## 2023-09-20 DIAGNOSIS — I5032 Chronic diastolic (congestive) heart failure: Secondary | ICD-10-CM

## 2023-09-20 DIAGNOSIS — I130 Hypertensive heart and chronic kidney disease with heart failure and stage 1 through stage 4 chronic kidney disease, or unspecified chronic kidney disease: Secondary | ICD-10-CM | POA: Diagnosis present

## 2023-09-20 DIAGNOSIS — I2782 Chronic pulmonary embolism: Secondary | ICD-10-CM | POA: Diagnosis present

## 2023-09-20 DIAGNOSIS — Z79899 Other long term (current) drug therapy: Secondary | ICD-10-CM | POA: Diagnosis not present

## 2023-09-20 DIAGNOSIS — E1122 Type 2 diabetes mellitus with diabetic chronic kidney disease: Secondary | ICD-10-CM | POA: Diagnosis present

## 2023-09-20 DIAGNOSIS — Z5181 Encounter for therapeutic drug level monitoring: Secondary | ICD-10-CM

## 2023-09-20 DIAGNOSIS — I5033 Acute on chronic diastolic (congestive) heart failure: Secondary | ICD-10-CM | POA: Diagnosis present

## 2023-09-20 DIAGNOSIS — I272 Pulmonary hypertension, unspecified: Secondary | ICD-10-CM | POA: Diagnosis not present

## 2023-09-20 DIAGNOSIS — Z95828 Presence of other vascular implants and grafts: Secondary | ICD-10-CM | POA: Diagnosis not present

## 2023-09-20 DIAGNOSIS — I50810 Right heart failure, unspecified: Secondary | ICD-10-CM | POA: Diagnosis not present

## 2023-09-20 DIAGNOSIS — I50812 Chronic right heart failure: Secondary | ICD-10-CM

## 2023-09-20 DIAGNOSIS — I10 Essential (primary) hypertension: Secondary | ICD-10-CM | POA: Diagnosis present

## 2023-09-20 DIAGNOSIS — G4733 Obstructive sleep apnea (adult) (pediatric): Secondary | ICD-10-CM | POA: Diagnosis present

## 2023-09-20 DIAGNOSIS — I2724 Chronic thromboembolic pulmonary hypertension: Secondary | ICD-10-CM | POA: Diagnosis present

## 2023-09-20 DIAGNOSIS — J9621 Acute and chronic respiratory failure with hypoxia: Secondary | ICD-10-CM | POA: Diagnosis present

## 2023-09-20 DIAGNOSIS — N183 Chronic kidney disease, stage 3 unspecified: Secondary | ICD-10-CM | POA: Diagnosis present

## 2023-09-20 DIAGNOSIS — E872 Acidosis, unspecified: Secondary | ICD-10-CM | POA: Diagnosis present

## 2023-09-20 DIAGNOSIS — I5020 Unspecified systolic (congestive) heart failure: Secondary | ICD-10-CM | POA: Diagnosis not present

## 2023-09-20 DIAGNOSIS — I82509 Chronic embolism and thrombosis of unspecified deep veins of unspecified lower extremity: Secondary | ICD-10-CM | POA: Diagnosis present

## 2023-09-20 DIAGNOSIS — Y92009 Unspecified place in unspecified non-institutional (private) residence as the place of occurrence of the external cause: Secondary | ICD-10-CM | POA: Diagnosis not present

## 2023-09-20 DIAGNOSIS — Z7984 Long term (current) use of oral hypoglycemic drugs: Secondary | ICD-10-CM | POA: Diagnosis not present

## 2023-09-20 DIAGNOSIS — R042 Hemoptysis: Secondary | ICD-10-CM | POA: Diagnosis not present

## 2023-09-20 DIAGNOSIS — I2781 Cor pulmonale (chronic): Secondary | ICD-10-CM

## 2023-09-20 DIAGNOSIS — I5082 Biventricular heart failure: Secondary | ICD-10-CM | POA: Diagnosis present

## 2023-09-20 DIAGNOSIS — E1151 Type 2 diabetes mellitus with diabetic peripheral angiopathy without gangrene: Secondary | ICD-10-CM | POA: Diagnosis present

## 2023-09-20 DIAGNOSIS — I4891 Unspecified atrial fibrillation: Secondary | ICD-10-CM | POA: Diagnosis present

## 2023-09-20 DIAGNOSIS — Z6841 Body Mass Index (BMI) 40.0 and over, adult: Secondary | ICD-10-CM | POA: Diagnosis not present

## 2023-09-20 DIAGNOSIS — I4892 Unspecified atrial flutter: Secondary | ICD-10-CM | POA: Diagnosis present

## 2023-09-20 HISTORY — PX: RIGHT HEART CATH: CATH118263

## 2023-09-20 LAB — MRSA NEXT GEN BY PCR, NASAL: MRSA by PCR Next Gen: NOT DETECTED

## 2023-09-20 LAB — POCT I-STAT EG7
Acid-Base Excess: 0 mmol/L (ref 0.0–2.0)
Acid-base deficit: 1 mmol/L (ref 0.0–2.0)
Bicarbonate: 25.1 mmol/L (ref 20.0–28.0)
Bicarbonate: 25.1 mmol/L (ref 20.0–28.0)
Calcium, Ion: 1.18 mmol/L (ref 1.15–1.40)
Calcium, Ion: 1.21 mmol/L (ref 1.15–1.40)
HCT: 43 % (ref 39.0–52.0)
HCT: 43 % (ref 39.0–52.0)
Hemoglobin: 14.6 g/dL (ref 13.0–17.0)
Hemoglobin: 14.6 g/dL (ref 13.0–17.0)
O2 Saturation: 42 %
O2 Saturation: 47 %
Potassium: 3.9 mmol/L (ref 3.5–5.1)
Potassium: 4 mmol/L (ref 3.5–5.1)
Sodium: 141 mmol/L (ref 135–145)
Sodium: 142 mmol/L (ref 135–145)
TCO2: 26 mmol/L (ref 22–32)
TCO2: 26 mmol/L (ref 22–32)
pCO2, Ven: 43.6 mm[Hg] — ABNORMAL LOW (ref 44–60)
pCO2, Ven: 44.2 mm[Hg] (ref 44–60)
pH, Ven: 7.362 (ref 7.25–7.43)
pH, Ven: 7.368 (ref 7.25–7.43)
pO2, Ven: 25 mm[Hg] — CL (ref 32–45)
pO2, Ven: 27 mm[Hg] — CL (ref 32–45)

## 2023-09-20 LAB — COMPREHENSIVE METABOLIC PANEL
ALT: 23 U/L (ref 0–44)
AST: 22 U/L (ref 15–41)
Albumin: 3.6 g/dL (ref 3.5–5.0)
Alkaline Phosphatase: 50 U/L (ref 38–126)
Anion gap: 10 (ref 5–15)
BUN: 24 mg/dL — ABNORMAL HIGH (ref 6–20)
CO2: 25 mmol/L (ref 22–32)
Calcium: 8.8 mg/dL — ABNORMAL LOW (ref 8.9–10.3)
Chloride: 102 mmol/L (ref 98–111)
Creatinine, Ser: 1.55 mg/dL — ABNORMAL HIGH (ref 0.61–1.24)
GFR, Estimated: 53 mL/min — ABNORMAL LOW (ref >60)
Glucose, Bld: 101 mg/dL — ABNORMAL HIGH (ref 70–99)
Potassium: 3.8 mmol/L (ref 3.5–5.1)
Sodium: 137 mmol/L (ref 135–145)
Total Bilirubin: 0.9 mg/dL (ref 0.0–1.2)
Total Protein: 7.5 g/dL (ref 6.5–8.1)

## 2023-09-20 LAB — COOXEMETRY PANEL
Carboxyhemoglobin: 0.7 % (ref 0.5–1.5)
Methemoglobin: <0.7 % (ref 0.0–1.5)
O2 Saturation: 34.8 %
Total hemoglobin: 14.7 g/dL (ref 12.0–16.0)
Total oxygen content: 34.3 %

## 2023-09-20 LAB — TROPONIN I (HIGH SENSITIVITY): Troponin I (High Sensitivity): 86 ng/L — ABNORMAL HIGH (ref <18)

## 2023-09-20 LAB — GLUCOSE, CAPILLARY
Glucose-Capillary: 114 mg/dL — ABNORMAL HIGH (ref 70–99)
Glucose-Capillary: 103 mg/dL — ABNORMAL HIGH (ref 70–99)
Glucose-Capillary: 94 mg/dL (ref 70–99)
Glucose-Capillary: 93 mg/dL (ref 70–99)

## 2023-09-20 LAB — HEPARIN LEVEL (UNFRACTIONATED)
Heparin Unfractionated: 0.1 [IU]/mL — ABNORMAL LOW (ref 0.30–0.70)
Heparin Unfractionated: 0.21 [IU]/mL — ABNORMAL LOW (ref 0.30–0.70)

## 2023-09-20 SURGERY — RIGHT HEART CATH
Anesthesia: Moderate Sedation | Laterality: Right

## 2023-09-20 MED ORDER — INSULIN ASPART 100 UNIT/ML IJ SOLN: 0.0000 [IU] | INTRAMUSCULAR | Status: DC

## 2023-09-20 MED ORDER — SPIRONOLACTONE 25 MG PO TABS
25.0000 mg | ORAL_TABLET | Freq: Every day | ORAL | Status: DC
  Administered 2023-09-20 – 2023-09-26 (×7): 25 mg via ORAL
  Filled 2023-09-20 (×7): qty 1

## 2023-09-20 MED ORDER — SILDENAFIL CITRATE 20 MG PO TABS
20.0000 mg | ORAL_TABLET | Freq: Three times a day (TID) | ORAL | Status: DC
  Administered 2023-09-20 – 2023-09-25 (×15): 20 mg via ORAL
  Filled 2023-09-20 (×17): qty 1

## 2023-09-20 MED ORDER — ALBUTEROL SULFATE HFA 108 (90 BASE) MCG/ACT IN AERS: 2.0000 | INHALATION_SPRAY | Freq: Four times a day (QID) | RESPIRATORY_TRACT | Status: DC | PRN

## 2023-09-20 MED ORDER — ALBUTEROL SULFATE (2.5 MG/3ML) 0.083% IN NEBU: 2.5000 mg | INHALATION_SOLUTION | Freq: Four times a day (QID) | RESPIRATORY_TRACT | Status: DC | PRN

## 2023-09-20 MED ORDER — WARFARIN SODIUM 7.5 MG PO TABS
7.5000 mg | ORAL_TABLET | Freq: Once | ORAL | Status: AC
  Administered 2023-09-20: 7.5 mg via ORAL
  Filled 2023-09-20: qty 1

## 2023-09-20 MED ORDER — ASPIRIN 81 MG PO CHEW
CHEWABLE_TABLET | ORAL | Status: AC
  Filled 2023-09-20: qty 4

## 2023-09-20 MED ORDER — HEPARIN (PORCINE) 25000 UT/250ML-% IV SOLN: 14.0000 [IU]/kg/h | INTRAVENOUS | Status: DC

## 2023-09-20 MED ORDER — IOHEXOL 350 MG/ML SOLN
100.0000 mL | Freq: Once | INTRAVENOUS | Status: AC | PRN
  Administered 2023-09-20: 100 mL via INTRAVENOUS

## 2023-09-20 MED ORDER — TIRZEPATIDE 7.5 MG/0.5ML ~~LOC~~ SOAJ: 7.5000 mg | SUBCUTANEOUS | Status: DC

## 2023-09-20 MED ORDER — FUROSEMIDE 10 MG/ML IJ SOLN
INTRAMUSCULAR | Status: AC
  Filled 2023-09-20: qty 8

## 2023-09-20 MED ORDER — HEPARIN (PORCINE) IN NACL 2000-0.9 UNIT/L-% IV SOLN
INTRAVENOUS | Status: DC | PRN
  Administered 2023-09-20: 1000 mL

## 2023-09-20 MED ORDER — WARFARIN - PHARMACIST DOSING INPATIENT: Freq: Every day | Status: DC

## 2023-09-20 MED ORDER — TORSEMIDE 20 MG PO TABS
20.0000 mg | ORAL_TABLET | Freq: Every day | ORAL | Status: DC
  Administered 2023-09-20: 20 mg via ORAL
  Filled 2023-09-20: qty 1

## 2023-09-20 MED ORDER — ACETAMINOPHEN 325 MG PO TABS
650.0000 mg | ORAL_TABLET | ORAL | Status: DC | PRN
  Administered 2023-09-21 – 2023-09-27 (×8): 650 mg via ORAL
  Filled 2023-09-20 (×8): qty 2

## 2023-09-20 MED ORDER — METOPROLOL SUCCINATE ER 50 MG PO TB24
50.0000 mg | ORAL_TABLET | Freq: Every day | ORAL | Status: DC
  Administered 2023-09-20: 50 mg via ORAL
  Filled 2023-09-20: qty 1

## 2023-09-20 MED ORDER — DIGOXIN 125 MCG PO TABS
0.0625 mg | ORAL_TABLET | Freq: Every day | ORAL | Status: DC
  Administered 2023-09-20 – 2023-09-30 (×11): 0.0625 mg via ORAL
  Filled 2023-09-20 (×12): qty 0.5

## 2023-09-20 MED ORDER — HEPARIN (PORCINE) 25000 UT/250ML-% IV SOLN
1750.0000 [IU]/h | INTRAVENOUS | Status: DC
  Administered 2023-09-20: 1550 [IU]/h via INTRAVENOUS
  Administered 2023-09-20: 1350 [IU]/h via INTRAVENOUS
  Administered 2023-09-21 – 2023-09-22 (×3): 1750 [IU]/h via INTRAVENOUS
  Filled 2023-09-20 (×5): qty 250

## 2023-09-20 MED ORDER — SODIUM CHLORIDE 0.9 % IV BOLUS
500.0000 mL | Freq: Once | INTRAVENOUS | Status: AC
  Administered 2023-09-20: 500 mL via INTRAVENOUS

## 2023-09-20 MED ORDER — ASPIRIN 81 MG PO TBEC
81.0000 mg | DELAYED_RELEASE_TABLET | Freq: Every day | ORAL | Status: DC
  Administered 2023-09-21 – 2023-09-22 (×2): 81 mg via ORAL
  Filled 2023-09-20 (×2): qty 1

## 2023-09-20 MED ORDER — ASPIRIN 300 MG RE SUPP: 300.0000 mg | RECTAL | Status: AC

## 2023-09-20 MED ORDER — FUROSEMIDE 10 MG/ML IJ SOLN
80.0000 mg | Freq: Once | INTRAMUSCULAR | Status: AC
  Administered 2023-09-20: 80 mg via INTRAVENOUS

## 2023-09-20 MED ORDER — ASPIRIN 81 MG PO CHEW
324.0000 mg | CHEWABLE_TABLET | ORAL | Status: AC
  Administered 2023-09-20: 324 mg via ORAL

## 2023-09-20 MED ORDER — CHLORHEXIDINE GLUCONATE CLOTH 2 % EX PADS
6.0000 | MEDICATED_PAD | Freq: Every day | CUTANEOUS | Status: DC
  Administered 2023-09-20 – 2023-09-30 (×9): 6 via TOPICAL

## 2023-09-20 MED ORDER — ONDANSETRON HCL 4 MG/2ML IJ SOLN
4.0000 mg | Freq: Four times a day (QID) | INTRAMUSCULAR | Status: DC | PRN
  Administered 2023-09-24: 4 mg via INTRAVENOUS
  Filled 2023-09-20 (×2): qty 2

## 2023-09-20 MED ORDER — LIDOCAINE HCL (PF) 1 % IJ SOLN
INTRAMUSCULAR | Status: DC | PRN
  Administered 2023-09-20: 2 mL

## 2023-09-20 MED ORDER — HEPARIN BOLUS VIA INFUSION
4000.0000 [IU] | Freq: Once | INTRAVENOUS | Status: AC
  Administered 2023-09-20: 4000 [IU] via INTRAVENOUS
  Filled 2023-09-20: qty 4000

## 2023-09-20 MED ORDER — SODIUM CHLORIDE 0.9 % IV SOLN: INTRAVENOUS | Status: DC

## 2023-09-20 MED ORDER — HEPARIN SODIUM (PORCINE) 5000 UNIT/ML IJ SOLN: 4000.0000 [IU] | Freq: Once | INTRAMUSCULAR | Status: DC

## 2023-09-20 MED ORDER — MILRINONE LACTATE IN DEXTROSE 20-5 MG/100ML-% IV SOLN
0.2500 ug/kg/min | INTRAVENOUS | Status: DC
  Administered 2023-09-21 – 2023-09-30 (×28): 0.25 ug/kg/min via INTRAVENOUS
  Filled 2023-09-20 (×29): qty 100

## 2023-09-20 MED ORDER — NITROGLYCERIN 0.4 MG SL SUBL: 0.4000 mg | SUBLINGUAL_TABLET | SUBLINGUAL | Status: DC | PRN

## 2023-09-20 MED ORDER — MILRINONE LACTATE IN DEXTROSE 20-5 MG/100ML-% IV SOLN
0.1250 ug/kg/min | INTRAVENOUS | Status: DC
  Administered 2023-09-20: 0.125 ug/kg/min via INTRAVENOUS
  Filled 2023-09-20: qty 100

## 2023-09-20 MED ORDER — ORAL CARE MOUTH RINSE: 15.0000 mL | OROMUCOSAL | Status: DC | PRN

## 2023-09-20 MED ORDER — LOSARTAN POTASSIUM 50 MG PO TABS
25.0000 mg | ORAL_TABLET | Freq: Every day | ORAL | Status: DC
  Administered 2023-09-20: 25 mg via ORAL
  Filled 2023-09-20: qty 0.5

## 2023-09-20 SURGICAL SUPPLY — 9 items
CANNULA 5F STIFF (CANNULA) IMPLANT
CATH SWAN GANZ 7F STRAIGHT (CATHETERS) IMPLANT
DRAPE BRACHIAL (DRAPES) IMPLANT
GUIDEWIRE EMER 3M J .025X150CM (WIRE) IMPLANT
KIT SYRINGE INJ CVI SPIKEX1 (MISCELLANEOUS) IMPLANT
PROTECTION STATION PRESSURIZED (MISCELLANEOUS) ×1
SET ATX-X65L (MISCELLANEOUS) IMPLANT
SHEATH AVANTI 7FRX11 (SHEATH) IMPLANT
STATION PROTECTION PRESSURIZED (MISCELLANEOUS) IMPLANT

## 2023-09-20 NOTE — Assessment & Plan Note (Signed)
-

## 2023-09-20 NOTE — Progress Notes (Addendum)
ANTICOAGULATION CONSULT NOTE  Pharmacy Consult for heparin infusion and resume PTA warfarin  Indication: ACS/STEMI,  VTE treatment  Allergies  Allergen Reactions   Jardiance [Empagliflozin]     Caused infection    Metrizamide Other (See Comments)    Kidney failure requiring dialysis    Clindamycin/Lincomycin Rash   Lincomycin Rash   Sulfa Antibiotics Rash    Patient Measurements: Height: 5\' 7"  (170.2 cm) Weight: (!) 156.5 kg (345 lb 0.3 oz) IBW/kg (Calculated) : 66.1 Heparin Dosing Weight: 104.8 kg  Vital Signs: Temp: 97.7 F (36.5 C) (01/21 0840) Temp Source: Oral (01/21 0840) BP: 114/89 (01/21 1500) Pulse Rate: 78 (01/21 1400)  Labs: Recent Labs    09/19/23 1351 09/19/23 2216 09/20/23 0054 09/20/23 0814 09/20/23 1430  HGB  --  15.1  --  14.6  14.6  --   HCT  --  49.3  --  43.0  43.0  --   PLT  --  234  --   --   --   LABPROT 17.7* 16.7*  --   --   --   INR 1.7* 1.3*  --   --   --   HEPARINUNFRC  --   --   --   --  0.10*  CREATININE  --   --  1.55*  --   --   TROPONINIHS  --  68* 86*  --   --     Estimated Creatinine Clearance: 79.8 mL/min (A) (by C-G formula based on SCr of 1.55 mg/dL (H)).   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  Regular the workup has not been forthcoming) previously [facility]   Hepatic steatosis    Hypertension    Morbid obesity with BMI of 50.0-59.9, adult (HCC)    Oxygen deficiency    Peripheral vascular disease (HCC)    Presence of IVC filter    Has 2 IVC filters with significant thrombus burden superior  to the filter.   Pulmonary embolus (HCC) 12/2016   Initially just treated with anticoagulation, but in February 2023 in April 2024 treated with EKOS thrombectomy for submassive PE.   Ventricular trigeminy    Weakness of both lower limbs     Assessment: Pt is a 53 yo male w/ hx of DVT/PE on warfarin (last dose 1/19 @ 2345) presenting to ED c/o SOB x 1.5 days found with elevated troponin I level trending up.  Pt has/had cardiac cath procedure scheduled for today at 0730. Hx: multiple DVTs/PES dating back to 78s. Per chart review, has MTHFR gene mutation and was previously followed by Hematology (Dr. Isaiah Serge)   -patient on warfarin 7.5mg  daily PTA- last dose per Med Rec in 09/19/23 in evening  HL: 1/21 1430 HL= 0.10  SUBtherapeutic (drawn ~ 4 hours after heparin drip restart from procedure)   INR: 1/20 INR 1.3    (per Med rec pt took 7.5 mg dose PTA) 1/21 no INR -resume warfarin 7.5mg  x 1 , check INR in am   Goal of Therapy:  Heparin  level 0.3-0.7 units/ml Monitor platelets by anticoagulation protocol: Yes   Plan:  Heparin: Drip restarted 1/21@1015  post cath increase heparin infusion to 1550 units/hr Will check HL in 6 hr after rate change Will bridge with heparin drip until INR therapeutic with warfarin CBC daily while on heparin  Warfarin: Pt to resume warfarin for VTE treatment Will resume Warfarin 7.5 mg po x 1  F/u INR daily and CBC per protocol   Bari Mantis PharmD Clinical Pharmacist 09/20/2023

## 2023-09-20 NOTE — Progress Notes (Signed)
  Progress Note   Patient: Ryan [name] FAO:[id number] DOB: Oct 28, 1970 DOA: 09/20/2023     0 DOS: the patient was seen and examined on 09/20/2023   Nonbillable note  Brief hospital course:  Patient seen and examined at bedside this morning GURNEY HONORATO is a 53 y.o. 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  Patient patient being planned for cardiac catheterization today. Denies ongoing chest pain.  For other detailed assessment and plan please refer to H&P dictated this morning by Dr. Para March   Vitals:   09/20/23 1530 09/20/23 1545 09/20/23 1600 09/20/23 1615  BP: (!) 123/90 (!) 126/92 (!) 140/113 111/80  Pulse: 71 80 75 76  Resp: 18 (!) 27 (!) 21 (!) 25  Temp:      TempSrc:      SpO2: 95% 93% 93% 91%  Weight:      Height:         Author: Loyce Dys, MD 09/20/2023 4:42 PM  For on call review [email or web address].

## 2023-09-20 NOTE — Assessment & Plan Note (Addendum)
Chronic pulmonary embolism/chronic DVT s/p IVC filter On heparin infusion Holding Coumadin for now

## 2023-09-20 NOTE — ED Notes (Signed)
Pt contact made and myself introduced. Pt is CAOx4, breathing normally, and is normal in color. Same is relaxing and does not appear to be in any distress. Pt complains of chest pain on exertion, but no pain when sitting still. Pt does present with a dry persistent cough.

## 2023-09-20 NOTE — Assessment & Plan Note (Signed)
Complicating factor to overall prognosis and care 

## 2023-09-20 NOTE — Progress Notes (Signed)
ANTICOAGULATION CONSULT NOTE  Pharmacy Consult for heparin infusion Indication: ACS/STEMI  Allergies  Allergen Reactions   Jardiance [Empagliflozin]     Caused infection    Metrizamide Other (See Comments)    Kidney failure requiring dialysis    Clindamycin/Lincomycin Rash   Lincomycin Rash   Sulfa Antibiotics Rash    Patient Measurements: Height: 5\' 7"  (170.2 cm) Weight: (!) 156.5 kg (345 lb) IBW/kg (Calculated) : 66.1 Heparin Dosing Weight: 104.8 kg  Vital Signs: Temp: 98.2 F (36.8 C) (01/21 0148) Temp Source: Oral (01/21 0148) BP: 135/90 (01/21 0148) Pulse Rate: 90 (01/21 0148)  Labs: Recent Labs    09/19/23 1351 09/19/23 2216 09/20/23 0054  HGB  --  15.1  --   HCT  --  49.3  --   PLT  --  234  --   LABPROT 17.7* 16.7*  --   INR 1.7* 1.3*  --   CREATININE  --   --  1.55*  TROPONINIHS  --  68* 86*    Estimated Creatinine Clearance: 79.8 mL/min (A) (by C-G formula based on SCr of 1.55 mg/dL (H)).   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  Regular the workup has not been forthcoming) previously followed by Dr. Isaiah Serge at [facility]   Hepatic steatosis    Hypertension    Morbid obesity with BMI of 50.0-59.9, adult (HCC)    Oxygen deficiency    Peripheral vascular disease (HCC)    Presence of IVC filter    Has 2 IVC filters with significant thrombus burden superior to the filter.   Pulmonary embolus (HCC) 12/2016   Initially just treated with anticoagulation, but in February 2023 in April 2024 treated with EKOS thrombectomy for submassive PE.   Ventricular  trigeminy    Weakness of both lower limbs     Assessment: Pt is a 53 yo male w/ hx of DVT/PE on warfarin (last dose 1/19 @ 2345) presenting to ED c/o SOB x 1.5 days found with elevated troponin I level trending up.  Pt has/had cardiac cath procedure scheduled for today at 0730.  Goal of Therapy:  Heparin level 0.3-0.7 units/ml Monitor platelets by anticoagulation protocol: Yes   Plan:  Bolus 4000 units x 1 Start heparin infusion at 1350 units/hr Will check HL in 6 hr after start of infusion CBC daily while on heparin  Otelia Sergeant, PharmD, [facility] 09/20/2023 3:51 AM

## 2023-09-20 NOTE — H&P (Signed)
History and Physical    Patient: Ryan [name] [id number]:[id number] DOB: 1971/04/29 DOA: 09/20/2023 DOS: the patient was seen and examined on 09/20/2023 PCP: Sherlene Shams, MD  Patient coming from: Home  Chief Complaint:  Chief Complaint  Patient presents with   Shortness of Breath    HPI: Ryan [name] is a 53 y.o. male with medical history significant for morbid obesity, CKD 3, chronic lower extremity wounds, CHF with preserved EF, DM2, PE/DVT status post IVC on Coumadin since April 2024, (failed Eliquis and Xarelto in the past) admitted 1 month ago with CHF exacerbation, currently scheduled for cardiac cath today 1/21, who presented to the ED with a 2-day history of shortness of breath and sharp chest pain with deep inspiration.  States he was trying to hold out for his procedure but symptoms got worse.  Had been recently holding his Coumadin due to a supratherapeutic INR.  He denies chest pain, lower extremity pain or swelling. ED course and data review: BP 177/101 with otherwise normal vitals. Troponin 86 and BNP 280 INR 1.3 Creatinine at baseline at 1.55 ,EKG personally viewed and interpreted showing sinus at 100 with anterior T wave inversion CTA chest PE protocol showing stable chronic PE among other nonacute findings Chest x-ray with stable chest Patient started on a heparin infusion Hospitalist consulted for admission.   Review of Systems: As mentioned in the history of present illness. All other systems reviewed and are negative.  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  Regular the workup has  not been forthcoming) previously followed by [facility] Isaiah Serge at [facility]   Hepatic steatosis    Hypertension    Morbid obesity with BMI of 50.0-59.9, adult (HCC)    Oxygen deficiency    Peripheral vascular disease (HCC)    Presence of IVC filter    Has 2 IVC filters with significant thrombus burden superior to the filter.   Pulmonary embolus (HCC) 12/2016   Initially just treated with anticoagulation, but in February 2023 in April 2024 treated with EKOS thrombectomy for submassive PE.   Ventricular trigeminy    Weakness of both lower limbs    Past Surgical History:  Procedure Laterality Date   CARDIAC ELECTROPHYSIOLOGY STUDY AND ABLATION  10/2020   CARDIOVERSION N/A 06/21/2023   Procedure: CARDIOVERSION;  Surgeon: Dorthula Nettles, DO;  Location: [facility] ORS;  Service: Cardiovascular;  Laterality: N/A;   PERIPHERAL VASCULAR CATHETERIZATION N/A 01/10/2015   Procedure: Dialysis/Perma Catheter Insertion;  Surgeon: Renford Dills, MD;  Location: [facility] INVASIVE CV LAB;  Service: Cardiovascular;  Laterality: N/A;   PERIPHERAL VASCULAR CATHETERIZATION N/A 02/24/2015   Procedure: Dialysis/Perma Catheter Removal;  Surgeon: Annice Needy, MD;  Location: [facility] INVASIVE CV LAB;  Service: Cardiovascular;  Laterality: N/A;   PULMONARY THROMBECTOMY N/A 12/13/2022   Procedure: PULMONARY THROMBECTOMY;  Surgeon: Annice Needy, MD;  Location: [facility] INVASIVE CV LAB;  Service: Cardiovascular;  Laterality: N/A;   PULMONARY THROMBECTOMY N/A 05/17/2023   Procedure: PULMONARY THROMBECTOMY;  Surgeon: Renford Dills, MD;  Location: [facility] INVASIVE CV LAB;  Service: Cardiovascular;  Laterality: N/A;   TEE WITHOUT CARDIOVERSION N/A 06/21/2023   Procedure: TRANSESOPHAGEAL ECHOCARDIOGRAM (TEE);  Surgeon: Dorthula Nettles, DO;  Location: [facility] ORS;  Service: Cardiovascular;  Laterality: N/A;   Social History:  reports that he has never smoked. He has never used smokeless tobacco. He reports that he does not drink alcohol and does not  use drugs.  Allergies  Allergen Reactions   Jardiance [Empagliflozin]     Caused infection    Metrizamide Other (See Comments)    Kidney failure requiring dialysis    Clindamycin/Lincomycin Rash   Lincomycin Rash   Sulfa Antibiotics Rash    Family History  Problem Relation Age of Onset   Hypertension Mother    Cancer Mother    Breast cancer Mother    Prostate cancer Father    Hypertension Father    Diabetes Father    Heart disease Maternal Grandmother    Pulmonary embolism Paternal Grandfather    Pulmonary embolism Paternal Uncle    Pulmonary embolism Cousin    Deep vein thrombosis Cousin    Pulmonary embolism Paternal Aunt     Prior to Admission medications   Medication Sig Start Date End Date Taking? Authorizing Provider  albuterol (VENTOLIN HFA) 108 (90 Base) MCG/ACT inhaler Inhale 2 puffs into the lungs every 6 (six) hours as needed for wheezing or shortness of breath. 02/23/22   Sunnie Nielsen, DO  Digoxin 62.5 MCG TABS Take 0.0625 mg by mouth daily. 09/15/23   Sherlene Shams, MD  losartan (COZAAR) 25 MG tablet Take 1 tablet (25 mg total) by mouth daily. 06/24/23   Loyce Dys, MD  metoprolol succinate (TOPROL-XL) 50 MG 24 hr tablet Take 1 tablet (50 mg total) by mouth daily. Take with or immediately following a meal. 06/24/23   Loyce Dys, MD  Multiple Vitamin (MULTIVITAMIN WITH MINERALS) TABS tablet Take 1 tablet by mouth daily. 06/24/23   Loyce Dys, MD  ondansetron (ZOFRAN) 4 MG tablet Take 1 tablet (4 mg total) by mouth every 8 (eight) hours as needed for nausea or vomiting. Patient not taking: Reported on 09/14/2023 08/18/23   Sherlene Shams, MD  OXYGEN Inhale 1 L into the lungs daily.    [provider]  spironolactone (ALDACTONE) 25 MG tablet Take 1 tablet (25 mg total) by mouth daily. 06/24/23   Loyce Dys, MD  tirzepatide [name]) 7.5 MG/0.5ML Pen Inject 7.5 mg into the skin once a week. 08/18/23   Sherlene Shams, MD  torsemide  (DEMADEX) 20 MG tablet Take 1 tablet (20 mg total) by mouth daily. 08/24/23   Lurene Shadow, MD  warfarin (COUMADIN) 5 MG tablet Take 1.5 tablets (7.5 mg total) by mouth daily. 08/22/23   Lurene Shadow, MD    Physical Exam: Vitals:   09/19/23 2202 09/20/23 0055 09/20/23 0148 09/20/23 0430  BP: (!) 177/101 (!) 151/112 (!) 135/90 (!) 141/95  Pulse: 97 79 90 77  Resp:  20 17 20   Temp: 98.2 F (36.8 C)  98.2 F (36.8 C)   TempSrc: Oral  Oral   SpO2: 91% 97% 95% 98%  Weight:      Height:       Physical Exam Vitals and nursing note reviewed.  Constitutional:      General: He is not in acute distress. HENT:     Head: Normocephalic and atraumatic.  Cardiovascular:     Rate and Rhythm: Normal rate and regular rhythm.     Heart sounds: Normal heart sounds.  Pulmonary:     Effort: Pulmonary effort is normal.     Breath sounds: Normal breath sounds.  Abdominal:     Palpations: Abdomen is soft.     Tenderness: There is no abdominal tenderness.  Neurological:     Mental Status: Mental status is at baseline.     Labs on Admission: I have personally reviewed following labs and imaging studies  CBC: Recent Labs  Lab 09/19/23 2216  WBC 4.8  NEUTROABS 1.8  HGB 15.1  HCT 49.3  MCV 99.8  PLT 234   Basic Metabolic Panel: Recent Labs  Lab 09/20/23 0054  NA 137  K 3.8  CL 102  CO2 25  GLUCOSE 101*  BUN 24*  CREATININE 1.55*  CALCIUM 8.8*   GFR: Estimated Creatinine Clearance: 79.8 mL/min (A) (by C-G formula based on SCr of 1.55 mg/dL (H)). Liver Function Tests: Recent Labs  Lab 09/20/23 0054  AST 22  ALT 23  ALKPHOS 50  BILITOT 0.9  PROT 7.5  ALBUMIN 3.6   No results for input(s): "LIPASE", "AMYLASE" in the last 168 hours. No results for input(s): "AMMONIA" in the last 168 hours. Coagulation Profile: Recent Labs  Lab 09/15/23 1151 09/16/23 0806 09/19/23 1351 09/19/23 2216  INR 8.1 Repeated and verified X2.* 6.3* 1.7* 1.3*   Cardiac Enzymes: No  results for input(s): "CKTOTAL", "CKMB", "CKMBINDEX", "TROPONINI" in the last 168 hours. BNP (last 3 results) No results for input(s): "PROBNP" in the last 8760 hours. HbA1C: No results for input(s): "HGBA1C" in the last 72 hours. CBG: No results for input(s): "GLUCAP" in the last 168 hours. Lipid Profile: No results for input(s): "CHOL", "HDL", "LDLCALC", "TRIG", "CHOLHDL", "LDLDIRECT" in the last 72 hours. Thyroid Function Tests: No results for input(s): "TSH", "T4TOTAL", "FREET4", "T3FREE", "THYROIDAB" in the last 72 hours. Anemia Panel: No results for input(s): "VITAMINB12", "FOLATE", "FERRITIN", "TIBC", "IRON", "RETICCTPCT" in the last 72 hours. Urine analysis:    Component Value Date/Time   COLORURINE YELLOW 08/18/2023 1008   APPEARANCEUR CLEAR 08/18/2023 1008   APPEARANCEUR Clear 03/18/2022 1436   LABSPEC >=1.030 (A) 08/18/2023 1008   LABSPEC 1.012 12/25/2014 1655   PHURINE 6.0 08/18/2023 1008   GLUCOSEU NEGATIVE 08/18/2023 1008   HGBUR NEGATIVE 08/18/2023 1008   BILIRUBINUR NEGATIVE 08/18/2023 1008   BILIRUBINUR Negative 03/18/2022 1436   BILIRUBINUR Negative 12/25/2014 1655   KETONESUR NEGATIVE 08/18/2023 1008   PROTEINUR >=300 (A) 06/14/2023 0827   UROBILINOGEN 0.2 08/18/2023 1008   NITRITE NEGATIVE 08/18/2023 1008   LEUKOCYTESUR NEGATIVE 08/18/2023 1008   LEUKOCYTESUR Negative 12/25/2014 1655    Radiological Exams on Admission: CT Angio Chest PE W/Cm &/Or Wo Cm Result Date: 09/20/2023 CLINICAL DATA:  53 year old male with shortness of breath, inspiratory sharp pain. History of acute and chronic pulmonary embolus. EXAM: CT ANGIOGRAPHY CHEST WITH CONTRAST TECHNIQUE: Multidetector CT imaging of the chest was performed using the standard protocol during bolus administration of intravenous contrast. Multiplanar CT image reconstructions and MIPs were obtained to evaluate the vascular anatomy. RADIATION DOSE REDUCTION: This exam was performed according to the departmental  dose-optimization program which includes automated exposure control, adjustment of the mA and/or kV according to patient size and/or use of iterative reconstruction technique. CONTRAST:  OMNIPAQUE IOHEXOL 350 MG/ML SOLN COMPARISON:  CTA chest 08/18/2023 and earlier. FINDINGS: Cardiovascular: Good contrast bolus timing in the pulmonary arterial tree. Bulky, unresolved chronic PE in the right main pulmonary artery redemonstrated on series 6, image 108. This appears unchanged from last month and is decreased  compared to 12/13/2022. No saddle embolus. Little residual left main pulmonary artery clot. Comparatively small volume chronic PE in the left lower lobe has not significantly changed from the CTA last month or last April. Occasional small volume chronic PE also in the left upper lobe appears stable. Similar intermittent small volume chronic PE in the right lower lobe appears stable from last month, and seems mildly improved from last April. Chronic attenuation of right upper lobe pulmonary arteries is stable. No convincing acute pulmonary embolus. Stable cardiac size, borderline with no pericardial effusion. Contrast reflux from the right heart into the IVC and hepatic veins appears unchanged from last month. Little contrast in the aorta today. Mild Calcified aortic atherosclerosis. Prominent anterior chest wall and internal mammary venous collaterals are chronic and stable. Mediastinum/Nodes: Stable.  No mediastinal mass or lymphadenopathy. Lungs/Pleura: Slightly improved lung volumes from last month. Major airways remain patent. Asymmetric chronic left lung pulmonary artery enlargement and tortuosity is noted. No pleural effusion or consolidation. Ventilation not significantly changed from last April. Upper Abdomen: The chronic chest wall venous collaterals continue along the abdominal wall, stable. IVC filter remains in place but patency is not evaluated with the contrast timing on this exam. Visible  liver, gallbladder, spleen, pancreas, adrenal glands, and kidneys appear stable with only very early contrast opacification due to timing. Negative visible bowel. No free air or free fluid in the visible abdomen. Musculoskeletal: Some flowing endplate osteophytes in the thoracic spine again noted. Stable visualized osseous structures. No acute or suspicious osseous abnormality. Review of the MIP images confirms the above findings. IMPRESSION: 1. Stable Chronic PE, most pronounced in the right main pulmonary artery, and with chronically attenuated right upper lobe arterial branches. 2. 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 Stable ventilation with no acute pulmonary opacity or effusion. 3. Stable heart size with no pericardial effusion. Chronic venous collaterals in the chest and upper abdomen associated with abnormal distal IVC demonstrated on CT Abdomen and Pelvis in 2023. 4. No new abnormality identified on CTA Chest. Electronically Signed   By: Odessa Fleming M.D.   On: 09/20/2023 04:45   DG Chest 2 View Result Date: 09/19/2023 CLINICAL DATA:  Chest pain and shortness of breath. EXAM: CHEST - 2 VIEW COMPARISON:  CTA chest 08/18/2023.  PA Lat chest 08/18/2023. FINDINGS: The cardiac size is normal. There are mildly prominent central pulmonary arteries. No vascular congestion is seen. The mediastinum is normally outlined. The lungs are clear. There is bridging enthesopathy of the lower thoracic spine. IMPRESSION: No evidence of acute chest disease. Mildly prominent central pulmonary arteries. Stable chest. Electronically Signed   By: Almira Bar M.D.   On: 09/19/2023 22:31     Data Reviewed: Relevant notes from primary care and specialist visits, past discharge summaries as available in EHR, including Care Everywhere. Prior diagnostic testing as pertinent to current admission diagnoses Updated medications and problem lists for reconciliation ED course,  including vitals, labs, imaging, treatment and response to treatment Triage notes, nursing and pharmacy notes and ED provider's notes Notable results as noted in HPI   Assessment and Plan: * Acute dyspnea Elevated troponin, chest pain in the setting of septic therapeutic INR History of prior pulmonary embolism CTA with chronic pulmonary embolism Possible ACS Patient was scheduled for outpatient cardiac cath Will consult cardiology who might want to proceed with procedure as inpatient  Chronic diastolic CHF (congestive heart failure) (HCC) Clinically compensated though BNP slightly elevated at 280 Continue  metoprolol, losartan, digoxin, spironolactone and torsemide  Type 2 diabetes mellitus with diabetic chronic kidney disease (HCC) Sliding scale insulin coverage  Essential hypertension Continue home meds  CKD (chronic kidney disease), stage IIIa Renal function at baseline  Subtherapeutic international normalized ratio (INR) 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 Inpatient status is judged to be reasonable and necessary in order to provide the required intensity of service to ensure the patient's safety. The patient's presenting symptoms, physical exam findings, and initial radiographic and laboratory data in the context of their chronic comorbidities is felt to place them at high risk for further clinical deterioration. Furthermore, it is not anticipated that the patient will be  medically stable for discharge from the hospital within 2 midnights of admission.   * I certify that at the point of admission it is my clinical judgment that the patient will require inpatient hospital care spanning beyond 2 midnights from the point of admission due to high intensity of service, high risk for further deterioration and high frequency of surveillance required.*  Author: Andris Baumann, MD 09/20/2023 5:29 AM  For on call review [email or web address].

## 2023-09-20 NOTE — Assessment & Plan Note (Signed)
Clinically compensated though BNP slightly elevated at 280 Continue metoprolol, losartan, digoxin, spironolactone and torsemide

## 2023-09-20 NOTE — Progress Notes (Signed)
Pt requesting to go on cpap while he takes a nap because he feels that he stops breathing when he starts to go to sleep. He has prn cpap order so notified RT of this. RT came to bedside and put pt on cpap. Denies further needs.

## 2023-09-20 NOTE — Consult Note (Addendum)
Advanced Heart Failure Team Consult Note   Primary Physician: Sherlene Shams, MD HF Cardiologist:  Dr. 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 Para March with [facility].   Ryan [name] is a chronically ill 53 year old male with extensive family history of clotting disorders. 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 Per chart review, has MTHFR gene mutation and was previously followed by Hematology (Dr. Isaiah Serge). There has been some concern for poor compliance with anticoagulation. Hypercoagulable workup has been unrevealing. 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  He has had multiple hospitalizations in the past several months. 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  He underwent mechanical thrombectomy with vascular surgery, and reportedly felt significantly improved at discharge.     Admitted in 10/24 when he began experiencing shortness of breath, cough, and new diarrhea and vomiting.  He presented to the hospital and was found to be febrile with an elevated lactate.  Vital signs were within normal limits, although oxygen requirement had increased.  He was admitted to the ICU for further management.  CTA showed persistent nonocclusive thrombus in the right main pulmonary artery as well as new small left upper lobe segmental PE. During that hospitalization he was diuresed with IV lasix, medications were optimized and he was dishcharged home.   Readmitted in 12/24 with worsening dyspnea d/t acute on chronic RV failure. CTA with resolution of prior left-sided PE and stable chronic right-sided PE. He was diuresed and GDMT titrated. 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  He was scheduled for RHC today, however, presented to ED  early this am with concern for worsening dyspnea. Labs significant for INR 1.7 (warfarin recently cutting back after INR 8.1), Hgb 15, HS troponin 68>86, BNP 280, Scr 1.55 (baseline), K 3.8, Na 137. ECG with sinus tachycardia. CT chest w/ stable chronic PE. He was admitted for further workup. 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  Home Medications Prior to Admission medications   Medication Sig Start Date End Date Taking? Authorizing Provider  albuterol (VENTOLIN HFA) 108 (90 Base) MCG/ACT inhaler Inhale 2 puffs into the lungs every 6 (six) hours as needed for wheezing or shortness of breath. 02/23/22  Yes Sunnie Nielsen, DO  Digoxin 62.5 MCG TABS Take 0.0625 mg by mouth daily. 09/15/23  Yes Sherlene Shams, MD  losartan (COZAAR) 25 MG tablet Take 1 tablet (25 mg total) by mouth daily. 06/24/23  Yes Loyce Dys, MD  metoprolol succinate (TOPROL-XL) 50 MG 24 hr tablet Take 1 tablet (50 mg total) by mouth daily. Take with or immediately following a meal. 06/24/23  Yes Djan, Scarlette Calico, MD  Multiple Vitamin (MULTIVITAMIN WITH MINERALS) TABS tablet Take 1 tablet by mouth daily. 06/24/23  Yes Loyce Dys, MD  OXYGEN Inhale 1 L into the lungs daily.   Yes [provider]  spironolactone (ALDACTONE) 25 MG tablet Take 1 tablet (25 mg total) by mouth daily. 06/24/23  Yes Loyce Dys, MD  tirzepatide [facility]) 7.5 MG/0.5ML Pen Inject 7.5 mg into the skin once a week. 08/18/23  Yes Sherlene Shams, MD  torsemide (DEMADEX) 20 MG tablet Take 1 tablet (20 mg total) by mouth daily. 08/24/23  Yes Lurene Shadow, MD  warfarin (COUMADIN)  5 MG tablet Take 1.5 tablets (7.5 mg total) by mouth daily. 08/22/23  Yes Lurene Shadow, MD  ondansetron (ZOFRAN) 4 MG tablet Take 1 tablet (4 mg total) by mouth every 8 (eight) hours as needed for nausea or vomiting. Patient not taking: Reported on 09/14/2023 08/18/23   Sherlene Shams, MD    Past Medical History: Past Medical  History:  Diagnosis Date   (HFpEF) heart failure with preserved ejection fraction [facility])    Arrhythmia    atrial fibrillation   CHF (congestive heart failure) (HCC)    Chronic kidney disease 08/2013   Diabetes mellitus type II, non insulin dependent (HCC)    Diabetes mellitus without complication (HCC)    per pt-pre diabetic-Dr Dario Guardian stated said pt   DVT (deep venous thrombosis) (HCC)    Over 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  Regular the workup has not been forthcoming) previously followed by Dr. Isaiah Serge at [facility]   Hepatic steatosis    Hypertension    Morbid obesity with BMI of 50.0-59.9, adult (HCC)    Oxygen deficiency    Peripheral vascular disease (HCC)    Presence of IVC filter    Has 2 IVC filters with significant thrombus burden superior to the filter.   Pulmonary embolus (HCC) 12/2016   Initially just treated with anticoagulation, but in February 2023 in April 2024 treated with EKOS thrombectomy for submassive PE.   Ventricular trigeminy    Weakness of both lower limbs     Past Surgical History: Past Surgical History:  Procedure Laterality Date   CARDIAC ELECTROPHYSIOLOGY STUDY AND ABLATION  10/2020   CARDIOVERSION N/A 06/21/2023   Procedure: CARDIOVERSION;  Surgeon: Dorthula Nettles, DO;  Location: [facility] ORS;  Service: Cardiovascular;  Laterality: N/A;   PERIPHERAL VASCULAR CATHETERIZATION N/A 01/10/2015   Procedure: Dialysis/Perma Catheter Insertion;  Surgeon: Renford Dills, MD;  Location: [facility] INVASIVE CV LAB;  Service: Cardiovascular;  Laterality: N/A;   PERIPHERAL VASCULAR CATHETERIZATION N/A 02/24/2015   Procedure: Dialysis/Perma Catheter Removal;  Surgeon: Annice Needy, MD;  Location: [facility] INVASIVE CV LAB;  Service: Cardiovascular;  Laterality: N/A;   PULMONARY THROMBECTOMY N/A 12/13/2022   Procedure: PULMONARY THROMBECTOMY;  Surgeon: Annice Needy, MD;  Location: [facility] INVASIVE CV LAB;  Service: Cardiovascular;  Laterality: N/A;   PULMONARY THROMBECTOMY N/A 05/17/2023   Procedure:  PULMONARY THROMBECTOMY;  Surgeon: Renford Dills, MD;  Location: [facility] INVASIVE CV LAB;  Service: Cardiovascular;  Laterality: N/A;   TEE WITHOUT CARDIOVERSION N/A 06/21/2023   Procedure: TRANSESOPHAGEAL ECHOCARDIOGRAM (TEE);  Surgeon: Dorthula Nettles, DO;  Location: [facility] ORS;  Service: Cardiovascular;  Laterality: N/A;    Family History: Family History  Problem Relation Age of Onset   Hypertension Mother    Cancer Mother    Breast cancer Mother    Prostate cancer Father    Hypertension Father    Diabetes Father    Heart disease Maternal Grandmother    Pulmonary embolism Paternal Grandfather    Pulmonary embolism Paternal Uncle    Pulmonary embolism Cousin    Deep vein thrombosis Cousin    Pulmonary embolism Paternal Aunt     Social History: Social History   Socioeconomic History   Marital status: Married    Spouse name: Not on file   Number of children: Not on file   Years of education: Not on file   Highest education level: Some college, no degree  Occupational History   Not on file  Tobacco Use   Smoking status: Never   Smokeless  tobacco: Never  Vaping Use   Vaping status: Never Used  Substance and Sexual Activity   Alcohol use: Never   Drug use: Never   Sexual activity: Yes    Partners: Female  Other Topics Concern   Not on file  Social History Narrative   Not on file   Social Drivers of Health   Financial Resource Strain: Low Risk  (08/18/2023)   Overall Financial Resource Strain (CARDIA)    Difficulty of Paying Living Expenses: Not hard at all  Food Insecurity: No Food Insecurity (08/21/2023)   Hunger Vital Sign    Worried About Running Out of Food in the Last Year: Never true    Ran Out of Food in the Last Year: Never true  Transportation Needs: No Transportation Needs (08/21/2023)   PRAPARE - Administrator, Civil Service (Medical): No    Lack of Transportation (Non-Medical): No  Physical Activity: Insufficiently Active (08/18/2023)    Exercise Vital Sign    Days of Exercise per Week: 3 days    Minutes of Exercise per Session: 30 min  Stress: No Stress Concern Present (08/18/2023)   [employer] of Occupational Health - Occupational Stress Questionnaire    Feeling of Stress : Only a little  Social Connections: Moderately Integrated (08/18/2023)   Social Connection and Isolation Panel [NHANES]    Frequency of Communication with Friends and Family: More than three times a week    Frequency of Social Gatherings with Friends and Family: Once a week    Attends Religious Services: More than 4 times per year    Active Member of [employer] or Organizations: Yes    Attends Banker Meetings: More than 4 times per year    Marital Status: Separated    Allergies:  Allergies  Allergen Reactions   Jardiance [Empagliflozin]     Caused infection    Metrizamide Other (See Comments)    Kidney failure requiring dialysis    Clindamycin/Lincomycin Rash   Lincomycin Rash   Sulfa Antibiotics Rash    Objective:    Vital Signs:   Temp:  [97.5 F (36.4 C)-98.2 F (36.8 C)] 97.7 F (36.5 C) (01/21 0840) Pulse Rate:  [0-97] 75 (01/21 1200) Resp:  [15-24] 15 (01/21 1200) BP: (111-177)/(67-115) 111/80 (01/21 1200) SpO2:  [91 %-100 %] 94 % (01/21 1200) FiO2 (%):  [32 %] 32 % (01/21 1038) Weight:  [156.5 kg] 156.5 kg (01/21 0748)    Weight change: Filed Weights   09/19/23 2200 09/20/23 0748  Weight: (!) 156.5 kg (!) 156.5 kg    Intake/Output:   Intake/Output Summary (Last 24 hours) at 09/20/2023 1408 Last data filed at 09/20/2023 1400 Gross per 24 hour  Intake --  Output 650 ml  Net -650 ml      Physical Exam    General:  Fatigued appearing HEENT: normal Neck: supple. JVP difficult d/t thick neck Cor: PMI nondisplaced. Regular rate & rhythm. No rubs, gallops or murmurs. Lungs: clear Abdomen: obese, soft, nontender, nondistended.  Extremities: no cyanosis, clubbing, rash, 1+ edema, chronic venous  stasis changes to both lower extremities Neuro: alert & orientedx3y. Affect pleasant   Telemetry   NSR  Labs   Basic Metabolic Panel: Recent Labs  Lab 09/20/23 0054 09/20/23 0814  NA 137 142  141  K 3.8 3.9  4.0  CL 102  --   CO2 25  --   GLUCOSE 101*  --   BUN 24*  --   CREATININE 1.55*  --  CALCIUM 8.8*  --     Liver Function Tests: Recent Labs  Lab 09/20/23 0054  AST 22  ALT 23  ALKPHOS 50  BILITOT 0.9  PROT 7.5  ALBUMIN 3.6   No results for input(s): "LIPASE", "AMYLASE" in the last 168 hours. No results for input(s): "AMMONIA" in the last 168 hours.  CBC: Recent Labs  Lab 09/19/23 2216 09/20/23 0814  WBC 4.8  --   NEUTROABS 1.8  --   HGB 15.1 14.6  14.6  HCT 49.3 43.0  43.0  MCV 99.8  --   PLT 234  --     Cardiac Enzymes: No results for input(s): "CKTOTAL", "CKMB", "CKMBINDEX", "TROPONINI" in the last 168 hours.  BNP: BNP (last 3 results) Recent Labs    07/19/23 0310 08/19/23 0213 09/19/23 2216  BNP 298.0* 267.8* 280.4*    ProBNP (last 3 results) No results for input(s): "PROBNP" in the last 8760 hours.   CBG: Recent Labs  Lab 09/20/23 0849  GLUCAP 114*    Coagulation Studies: Recent Labs    09/19/23 1351 09/19/23 2216  LABPROT 17.7* 16.7*  INR 1.7* 1.3*     Imaging   [place] EKG SITE RITE Result Date: 09/20/2023 If Site Rite image not attached, placement could not be confirmed due to current cardiac rhythm.  [place] EKG SITE RITE Result Date: 09/20/2023 If [facility] image not attached, placement could not be confirmed due to current cardiac rhythm.  CT Angio Chest PE W/Cm &/Or Wo Cm Result Date: 09/20/2023 CLINICAL DATA:  53 year old male with shortness of breath, inspiratory sharp pain. History of acute and chronic pulmonary embolus. EXAM: CT ANGIOGRAPHY CHEST WITH CONTRAST TECHNIQUE: Multidetector CT imaging of the chest was performed using the standard protocol during bolus administration of intravenous contrast.  Multiplanar CT image reconstructions and MIPs were obtained to evaluate the vascular anatomy. RADIATION DOSE REDUCTION: This exam was performed according to the departmental dose-optimization program which includes automated exposure control, adjustment of the mA and/or kV according to patient size and/or use of iterative reconstruction technique. CONTRAST:  OMNIPAQUE IOHEXOL 350 MG/ML SOLN COMPARISON:  CTA chest 08/18/2023 and earlier. FINDINGS: Cardiovascular: Good contrast bolus timing in the pulmonary arterial tree. Bulky, unresolved chronic PE in the right main pulmonary artery redemonstrated on series 6, image 108. This appears unchanged from last month and is decreased compared to 12/13/2022. No saddle embolus. Little residual left main pulmonary artery clot. Comparatively small volume chronic PE in the left lower lobe has not significantly changed from the CTA last month or last April. Occasional small volume chronic PE also in the left upper lobe appears stable. Similar intermittent small volume chronic PE in the right lower lobe appears stable from last month, and seems mildly improved from last April. Chronic attenuation of right upper lobe pulmonary arteries is stable. No convincing acute pulmonary embolus. Stable cardiac size, borderline with no pericardial effusion. Contrast reflux from the right heart into the IVC and hepatic veins appears unchanged from last month. Little contrast in the aorta today. Mild Calcified aortic atherosclerosis. Prominent anterior chest wall and internal mammary venous collaterals are chronic and stable. Mediastinum/Nodes: Stable.  No mediastinal mass or lymphadenopathy. Lungs/Pleura: Slightly improved lung volumes from last month. Major airways remain patent. Asymmetric chronic left lung pulmonary artery enlargement and tortuosity is noted. No pleural effusion or consolidation. Ventilation not significantly changed from last April. Upper Abdomen: The chronic chest  wall venous collaterals continue along the abdominal wall, stable. IVC filter remains in  place but patency is not evaluated with the contrast timing on this exam. Visible liver, gallbladder, spleen, pancreas, adrenal glands, and kidneys appear stable with only very early contrast opacification due to timing. Negative visible bowel. No free air or free fluid in the visible abdomen. Musculoskeletal: Some flowing endplate osteophytes in the thoracic spine again noted. Stable visualized osseous structures. No acute or suspicious osseous abnormality. Review of the MIP images confirms the above findings. IMPRESSION: 1. Stable Chronic PE, most pronounced in the right main pulmonary artery, and with chronically attenuated right upper lobe arterial branches. 2. 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 Stable ventilation with no acute pulmonary opacity or effusion. 3. Stable heart size with no pericardial effusion. Chronic venous collaterals in the chest and upper abdomen associated with abnormal distal IVC demonstrated on CT Abdomen and Pelvis in 2023. 4. No new abnormality identified on CTA Chest. Electronically Signed   By: Odessa Fleming M.D.   On: 09/20/2023 04:45   DG Chest 2 View Result Date: 09/19/2023 CLINICAL DATA:  Chest pain and shortness of breath. EXAM: CHEST - 2 VIEW COMPARISON:  CTA chest 08/18/2023.  PA Lat chest 08/18/2023. FINDINGS: The cardiac size is normal. There are mildly prominent central pulmonary arteries. No vascular congestion is seen. The mediastinum is normally outlined. The lungs are clear. There is bridging enthesopathy of the lower thoracic spine. IMPRESSION: No evidence of acute chest disease. Mildly prominent central pulmonary arteries. Stable chest. Electronically Signed   By: Almira Bar M.D.   On: 09/19/2023 22:31     Medications:     Current Medications:  aspirin       [MAR Hold] aspirin EC  81 mg Oral Daily   [MAR Hold]  digoxin  0.0625 mg Oral Daily   [MAR Hold] insulin aspart  0-20 Units Subcutaneous Q4H   [MAR Hold] losartan  25 mg Oral Daily   [MAR Hold] metoprolol succinate  50 mg Oral Daily   sildenafil  20 mg Oral TID   [MAR Hold] spironolactone  25 mg Oral Daily   [MAR Hold] tirzepatide  7.5 mg Subcutaneous Weekly   [MAR Hold] torsemide  20 mg Oral Daily    Infusions:  sodium chloride     heparin 1,350 Units/hr (09/20/23 1016)   milrinone        Patient Profile   53 y.o. 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 Place PICC for CVP and CO-Ox monitoring -Give 80 mg lasix IV. Reassess volume tomorrow am. -Start sildenafil 20 mg TID. Eventually transition to riociguat. Will need PA. -Stop beta blocker with low-output -Continue spiro 25 mg daily -Continue Losartan 25 mg daily -Continue digoxin 0.125 mg daily  2. Recurrent DVT/PE -Chronic, stable PE on CTA this admit.  -On heparin gtt.  -Resume Warfarin. INR recently supratherapeutic.  3. Atrial fibrillation/flutter -ECG on presentation with sinus tach -Stopped beta blocker as above -Heparin gtt  4. CKD IIIa -Baseline Scr seems to be 1.4-1.7 -Scr 1.55 today -Support CO with milrinone   Length of Stay: 0  Ladanian Kelter N, PA-C  09/20/2023, 2:08 PM  Advanced Heart Failure Team Pager [phone] (M-F; 7a -  5p)  Please contact [facility] for night-coverage after hours (4p -7a ) and weekends on [email or web address]

## 2023-09-20 NOTE — Assessment & Plan Note (Signed)
Sliding scale insulin coverage 

## 2023-09-20 NOTE — ED Notes (Signed)
Called CCMD and added pt to board.

## 2023-09-20 NOTE — ED Notes (Addendum)
Spoke with Katlyn in the Cath Lab and notified her that this patient is admitted in room 2 in ED.  He has a (previous) heart cath scheduled for 0730 this morning.  Charlesetta Garibaldi states she will notify the cardiologist.

## 2023-09-20 NOTE — ED Notes (Addendum)
Pt had recent sleep study and it was determined that he needs a CPAP at night. Pt is just waiting on insurance to approve the CPAP.  Per Dr. Dolores Frame, order placed for CPAP. Respiratory notified at  0455.

## 2023-09-20 NOTE — Interval H&P Note (Signed)
History and Physical Interval Note:  09/20/2023 7:38 AM  Ryan [name]  has presented today for surgery, with the diagnosis of R Cath   CHF.  The various methods of treatment have been discussed with the patient and family. After consideration of risks, benefits and other options for treatment, the patient has consented to  Procedure(s): RIGHT HEART CATH (Right) as a surgical intervention.  The patient's history has been reviewed, patient examined, no change in status, stable for surgery.  I have reviewed the patient's chart and labs.  Questions were answered to the patient's satisfaction.     [name]

## 2023-09-20 NOTE — Progress Notes (Signed)
Heart Failure Navigator Progress Note  Assessed for Heart & Vascular TOC clinic readiness.  Patient does not meet criteria due to current Heart Failure Team patient of Dr. [name], MD.   Navigator will sign of at this time.  Roxy Horseman, RN, BSN [facility] Heart Failure Navigator Secure Chat Only

## 2023-09-20 NOTE — ED Provider Notes (Signed)
[facility] Provider Note    Event Date/Time   First MD Initiated Contact with Patient 09/20/23 [phone]     (approximate)   History   Shortness of Breath   HPI  Ryan [name] is a 53 y.o. male  who presents to the ED from home with a chief complaint of chest pain and shortness of breath overnight. Patient with a history of CHF, DVT/recurrent PEs on Warfarin and s/p IVC filter, CKD, DM, htn, ventricular trigeminy who is scheduled for cardiac cath this morning but could not wait due to increasing sob. PRN oxygen at home which he used without relief of symptoms. Warfarin level was 8 on Thursday, told to hold dose x 1 day, rechecked the following day which was 6. Patient was instructed to hold Warfarin through the weekend and resumed yesterday. Denies associated fever/chills, cough, abdominal pain, nausea, vomiting, diaphoresis, palpitations or dizziness.      Past Medical History   Past Medical History:  Diagnosis Date   (HFpEF) heart failure with preserved ejection fraction (HCC)    Arrhythmia    atrial fibrillation   CHF (congestive heart failure) (HCC)    Chronic kidney disease 08/2013   Diabetes mellitus type II, non insulin dependent (HCC)    Diabetes mellitus without complication (HCC)    per pt-pre diabetic-Dr Dario Guardian stated said pt   DVT (deep venous thrombosis) (HCC)    Over 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  Regular the workup has not been forthcoming) previously followed by Dr. Isaiah Serge at [facility]   Hepatic steatosis    Hypertension    Morbid obesity with BMI of 50.0-59.9, adult (HCC)    Oxygen deficiency    Peripheral vascular disease (HCC)    Presence of IVC filter    Has 2 IVC filters with significant thrombus burden superior to the filter.   Pulmonary embolus (HCC) 12/2016   Initially just treated with anticoagulation, but in February 2023 in April 2024 treated with EKOS thrombectomy for submassive PE.   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/20/2023   Paroxysmal atrial fibrillation (HCC) 08/18/2023   Acute dyspnea 08/18/2023   Current use of long term anticoagulation 07/17/2023   Pulmonary hypertension, unspecified (HCC) 06/18/2023   Elevated troponin 06/14/2023   Cor pulmonale (chronic) (HCC) 05/16/2023   Other neutropenia (HCC) 01/19/2023   Anticoagulation goal of INR 2.5 to 3.5 12/24/2022   Rheumatoid factor positive 04/28/2022   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) 02/09/2022   Lymphadenopathy 02/09/2022   At risk for sleep apnea 01/27/2022   VTE (venous thromboembolism) 01/26/2022   Chronic diastolic CHF (congestive heart failure) (HCC) 01/04/2022   Clotting disorder (HCC) 10/19/2021   Angina at rest [facility]) 07/29/2021   Hyperlipidemia associated with type 2 diabetes mellitus (HCC) 07/29/2021   Atrial fibrillation with rapid ventricular response (HCC) 10/14/2020   Knee pain 03/11/2020   Weakness of both lower limbs 11/17/2017   Type 2 diabetes mellitus with diabetic chronic  kidney disease (HCC) 08/03/2017   CKD (chronic kidney disease), stage IIIa 08/03/2017   Chronic pulmonary embolism (HCC) 01/03/2017   Acute diastolic CHF (congestive heart failure) (HCC) 12/15/2016   Essential hypertension 12/15/2016   PVC (premature ventricular contraction) 11/24/2016   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   Hepatic steatosis 05/30/2013   Morbid obesity with BMI of 50.0-59.9, adult (HCC)  05/30/2013   Proteinuria 05/30/2013     Past Surgical History   Past Surgical History:  Procedure Laterality Date   CARDIAC ELECTROPHYSIOLOGY STUDY AND ABLATION  10/2020   CARDIOVERSION N/A 06/21/2023   Procedure: CARDIOVERSION;  Surgeon: Dorthula Nettles, DO;  Location: [facility] ORS;  Service: Cardiovascular;  Laterality: N/A;   PERIPHERAL VASCULAR CATHETERIZATION N/A 01/10/2015   Procedure: Dialysis/Perma Catheter Insertion;  Surgeon: Renford Dills, MD;  Location: [facility] INVASIVE CV LAB;  Service: Cardiovascular;  Laterality: N/A;   PERIPHERAL VASCULAR CATHETERIZATION N/A 02/24/2015   Procedure: Dialysis/Perma Catheter Removal;  Surgeon: Annice Needy, MD;  Location: [facility] INVASIVE CV LAB;  Service: Cardiovascular;  Laterality: N/A;   PULMONARY THROMBECTOMY N/A 12/13/2022   Procedure: PULMONARY THROMBECTOMY;  Surgeon: Annice Needy, MD;  Location: [facility] INVASIVE CV LAB;  Service: Cardiovascular;  Laterality: N/A;   PULMONARY THROMBECTOMY N/A 05/17/2023   Procedure: PULMONARY THROMBECTOMY;  Surgeon: Renford Dills, MD;  Location: [facility] INVASIVE CV LAB;  Service: Cardiovascular;  Laterality: N/A;   TEE WITHOUT CARDIOVERSION N/A 06/21/2023   Procedure: TRANSESOPHAGEAL ECHOCARDIOGRAM (TEE);  Surgeon: Dorthula Nettles, DO;  Location: [facility] ORS;  Service: Cardiovascular;  Laterality: N/A;     Home Medications   Prior to Admission medications   Medication Sig Start Date End Date Taking? Authorizing Provider  albuterol (VENTOLIN HFA) 108 (90 Base) MCG/ACT inhaler Inhale 2 puffs into the lungs every 6 (six) hours as needed for wheezing or shortness of breath. 02/23/22   Sunnie Nielsen, DO  Digoxin 62.5 MCG TABS Take 0.0625 mg by mouth daily. 09/15/23   Sherlene Shams, MD  losartan (COZAAR) 25 MG tablet Take 1 tablet (25 mg total) by mouth daily. 06/24/23   Loyce Dys, MD  metoprolol succinate (TOPROL-XL) 50 MG 24 hr tablet Take 1 tablet (50 mg total) by mouth daily. Take with or  immediately following a meal. 06/24/23   Loyce Dys, MD  Multiple Vitamin (MULTIVITAMIN WITH MINERALS) TABS tablet Take 1 tablet by mouth daily. 06/24/23   Loyce Dys, MD  ondansetron (ZOFRAN) 4 MG tablet Take 1 tablet (4 mg total) by mouth every 8 (eight) hours as needed for nausea or vomiting. Patient not taking: Reported on 09/14/2023 08/18/23   Sherlene Shams, MD  OXYGEN Inhale 1 L into the lungs daily.    [provider]  spironolactone (ALDACTONE) 25 MG tablet Take 1 tablet (25 mg total) by mouth daily. 06/24/23   Loyce Dys, MD  tirzepatide [facility]) 7.5 MG/0.5ML Pen Inject 7.5 mg into the skin once a week. 08/18/23   Sherlene Shams, MD  torsemide (DEMADEX) 20 MG tablet Take 1 tablet (20 mg total) by mouth daily. 08/24/23   Lurene Shadow, MD  warfarin (COUMADIN) 5 MG tablet Take 1.5 tablets (7.5 mg total) by mouth daily. 08/22/23   Lurene Shadow, MD     Allergies  Jardiance [empagliflozin], Metrizamide, Clindamycin/lincomycin, Lincomycin, and Sulfa antibiotics   Family History   Family History  Problem Relation Age of Onset   Hypertension Mother    Cancer Mother    Breast cancer Mother    Prostate  cancer Father    Hypertension Father    Diabetes Father    Heart disease Maternal Grandmother    Pulmonary embolism Paternal Grandfather    Pulmonary embolism Paternal Uncle    Pulmonary embolism Cousin    Deep vein thrombosis Cousin    Pulmonary embolism Paternal Aunt      Physical Exam  Triage Vital Signs: ED Triage Vitals  Encounter Vitals Group     BP 09/19/23 2202 (!) 177/101     Systolic BP Percentile --      Diastolic BP Percentile --      Pulse Rate 09/19/23 2202 97     Resp 09/19/23 2200 20     Temp 09/19/23 2202 98.2 F (36.8 C)     Temp Source 09/19/23 2202 Oral     SpO2 09/19/23 2202 91 %     Weight 09/19/23 2200 (!) 345 lb (156.5 kg)     Height 09/19/23 2200 5\' 7"  (1.702 m)     Head Circumference --      Peak Flow --      Pain  Score 09/19/23 2200 0     Pain Loc --      Pain Education --      Exclude from Growth Chart --     Updated Vital Signs: BP (!) 141/95   Pulse 77   Temp (!) 97.5 F (36.4 C) (Oral)   Resp 20   Ht 5\' 7"  (1.702 m)   Wt (!) 156.5 kg   SpO2 98%   BMI 54.03 kg/m    General: Awake, mild distress.  CV:  RRR. Good peripheral perfusion.  Resp:  Normal effort. Diminished, otherwise CTAB. Abd:  Obese. Nontender. No distention.  Other:  BLE lymphedema.   ED Results / Procedures / Treatments  Labs (all labs ordered are listed, but only abnormal results are displayed) Labs Reviewed  BRAIN NATRIURETIC PEPTIDE - Abnormal; Notable for the following components:      Result Value   B Natriuretic Peptide 280.4 (*)    All other components within normal limits  PROTIME-INR - Abnormal; Notable for the following components:   Prothrombin Time 16.7 (*)    INR 1.3 (*)    All other components within normal limits  COMPREHENSIVE METABOLIC PANEL - Abnormal; Notable for the following components:   Glucose, Bld 101 (*)    BUN 24 (*)    Creatinine, Ser 1.55 (*)    Calcium 8.8 (*)    GFR, Estimated 53 (*)    All other components within normal limits  TROPONIN I (HIGH SENSITIVITY) - Abnormal; Notable for the following components:   Troponin I (High Sensitivity) 68 (*)    All other components within normal limits  TROPONIN I (HIGH SENSITIVITY) - Abnormal; Notable for the following components:   Troponin I (High Sensitivity) 86 (*)    All other components within normal limits  CBC WITH DIFFERENTIAL/PLATELET  HEPARIN LEVEL (UNFRACTIONATED)     EKG  ED ECG REPORT I, Rylinn Linzy J, the attending physician, personally viewed and interpreted this ECG.   Date: 09/20/2023  EKG Time: 2158  Rate: 100  Rhythm: sinus tachycardia  Axis: RAD  Intervals:none  ST&T Change: Nonspecific    RADIOLOGY I have independently visualized and interpreted patient's imaging studies as well as noted the  radiology interpretation:  CXR: No acute cardiopulmonary process  CTA Chest: Stable PE, no acute changes  Official radiology report(s): CT Angio Chest PE W/Cm &/Or Wo Cm Result Date: 09/20/2023  CLINICAL DATA:  53 year old male with shortness of breath, inspiratory sharp pain. History of acute and chronic pulmonary embolus. EXAM: CT ANGIOGRAPHY CHEST WITH CONTRAST TECHNIQUE: Multidetector CT imaging of the chest was performed using the standard protocol during bolus administration of intravenous contrast. Multiplanar CT image reconstructions and MIPs were obtained to evaluate the vascular anatomy. RADIATION DOSE REDUCTION: This exam was performed according to the departmental dose-optimization program which includes automated exposure control, adjustment of the mA and/or kV according to patient size and/or use of iterative reconstruction technique. CONTRAST:  OMNIPAQUE IOHEXOL 350 MG/ML SOLN COMPARISON:  CTA chest 08/18/2023 and earlier. FINDINGS: Cardiovascular: Good contrast bolus timing in the pulmonary arterial tree. Bulky, unresolved chronic PE in the right main pulmonary artery redemonstrated on series 6, image 108. This appears unchanged from last month and is decreased compared to 12/13/2022. No saddle embolus. Little residual left main pulmonary artery clot. Comparatively small volume chronic PE in the left lower lobe has not significantly changed from the CTA last month or last April. Occasional small volume chronic PE also in the left upper lobe appears stable. Similar intermittent small volume chronic PE in the right lower lobe appears stable from last month, and seems mildly improved from last April. Chronic attenuation of right upper lobe pulmonary arteries is stable. No convincing acute pulmonary embolus. Stable cardiac size, borderline with no pericardial effusion. Contrast reflux from the right heart into the IVC and hepatic veins appears unchanged from last month. Little contrast in  the aorta today. Mild Calcified aortic atherosclerosis. Prominent anterior chest wall and internal mammary venous collaterals are chronic and stable. Mediastinum/Nodes: Stable.  No mediastinal mass or lymphadenopathy. Lungs/Pleura: Slightly improved lung volumes from last month. Major airways remain patent. Asymmetric chronic left lung pulmonary artery enlargement and tortuosity is noted. No pleural effusion or consolidation. Ventilation not significantly changed from last April. Upper Abdomen: The chronic chest wall venous collaterals continue along the abdominal wall, stable. IVC filter remains in place but patency is not evaluated with the contrast timing on this exam. Visible liver, gallbladder, spleen, pancreas, adrenal glands, and kidneys appear stable with only very early contrast opacification due to timing. Negative visible bowel. No free air or free fluid in the visible abdomen. Musculoskeletal: Some flowing endplate osteophytes in the thoracic spine again noted. Stable visualized osseous structures. No acute or suspicious osseous abnormality. Review of the MIP images confirms the above findings. IMPRESSION: 1. Stable Chronic PE, most pronounced in the right main pulmonary artery, and with chronically attenuated right upper lobe arterial branches. 2. 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 Stable ventilation with no acute pulmonary opacity or effusion. 3. Stable heart size with no pericardial effusion. Chronic venous collaterals in the chest and upper abdomen associated with abnormal distal IVC demonstrated on CT Abdomen and Pelvis in 2023. 4. No new abnormality identified on CTA Chest. Electronically Signed   By: Odessa Fleming M.D.   On: 09/20/2023 04:45   DG Chest 2 View Result Date: 09/19/2023 CLINICAL DATA:  Chest pain and shortness of breath. EXAM: CHEST - 2 VIEW COMPARISON:  CTA chest 08/18/2023.  PA Lat chest 08/18/2023. FINDINGS: The cardiac size  is normal. There are mildly prominent central pulmonary arteries. No vascular congestion is seen. The mediastinum is normally outlined. The lungs are clear. There is bridging enthesopathy of the lower thoracic spine. IMPRESSION: No evidence of acute chest disease. Mildly prominent central pulmonary arteries. Stable chest. Electronically Signed   By: Mellody Dance  Chesser M.D.   On: 09/19/2023 22:31     PROCEDURES:  Critical Care performed: Yes, see critical care procedure note(s)  CRITICAL CARE Performed by: Irean Hong   Total critical care time: 30 minutes  Critical care time was exclusive of separately billable procedures and treating other patients.  Critical care was necessary to treat or prevent imminent or life-threatening deterioration.  Critical care was time spent personally by me on the following activities: development of treatment plan with patient and/or surrogate as well as nursing, discussions with consultants, evaluation of patient's response to treatment, examination of patient, obtaining history from patient or surrogate, ordering and performing treatments and interventions, ordering and review of laboratory studies, ordering and review of radiographic studies, pulse oximetry and re-evaluation of patient's condition.   [name]1-3 Lead EKG Interpretation  Performed by: Irean Hong, MD Authorized by: Irean Hong, MD     Interpretation: abnormal     ECG rate:  100   ECG rate assessment: tachycardic     Rhythm: sinus tachycardia     Ectopy: none     Conduction: normal   Comments:     Patient placed on cardiac monitor to evaluate for arrthymias    MEDICATIONS ORDERED IN ED: Medications  heparin ADULT infusion 100 units/mL (25000 units/29mL) (1,350 Units/hr Intravenous New Bag/Given 09/20/23 0439)  digoxin (LANOXIN) tablet 0.0625 mg (has no administration in time range)  losartan (COZAAR) tablet 25 mg (has no administration in time range)  metoprolol succinate (TOPROL-XL)  24 hr tablet 50 mg (has no administration in time range)  spironolactone (ALDACTONE) tablet 25 mg (has no administration in time range)  torsemide (DEMADEX) tablet 20 mg (has no administration in time range)  tirzepatide (MOUNJARO) Pen 7.5 mg (has no administration in time range)  aspirin chewable tablet 324 mg (has no administration in time range)    Or  aspirin suppository 300 mg (has no administration in time range)  aspirin EC tablet 81 mg (has no administration in time range)  nitroGLYCERIN (NITROSTAT) SL tablet 0.4 mg (has no administration in time range)  acetaminophen (TYLENOL) tablet 650 mg (has no administration in time range)  ondansetron (ZOFRAN) injection 4 mg (has no administration in time range)  insulin aspart (novoLOG) injection 0-20 Units ( Subcutaneous Not Given 09/20/23 0600)  albuterol (PROVENTIL) (2.5 MG/3ML) 0.083% nebulizer solution 2.5 mg (has no administration in time range)  sodium chloride 0.9 % bolus 500 mL (500 mLs Intravenous Bolus 09/20/23 0325)  heparin bolus via infusion 4,000 Units (4,000 Units Intravenous Bolus from Bag 09/20/23 0439)  iohexol (OMNIPAQUE) 350 MG/ML injection 100 mL (100 mLs Intravenous Contrast Given 09/20/23 0421)     IMPRESSION / MDM / ASSESSMENT AND PLAN / ED COURSE  I reviewed the triage vital signs and the nursing notes.                             53 year old male presenting with chest pain and sob. Differential diagnosis includes, but is not limited to, ACS, aortic dissection, pulmonary embolism, cardiac tamponade, pneumothorax, pneumonia, pericarditis, myocarditis, GI-related causes including esophagitis/gastritis, and musculoskeletal chest wall pain.   I have personally reviewed patient's records and note cardiac cath scheduled for today.  Patient's presentation is most consistent with acute presentation with potential threat to life or bodily function.  The patient is on the cardiac monitor to evaluate for evidence of arrhythmia  and/or significant heart rate changes.  Laboratory results  notable for mild AKI Creatinine 1.55, slightly elevated BNP 280, increasing troponins 68-->86; subtherapeutic INR 1.3. Will obtain CTA chest to evaluate for acute PE as patient states his symptoms resemble those when he had PEs. Will start Heparin bolus with drip. Anticipate hospitalization.  Clinical Course as of 09/20/23 0616  Tue Sep 20, 2023  0500 CTA Chest negative for PE. Will consult hospitalist services for evaluation and admission. [JS]    Clinical Course User Index [JS] Irean Hong, MD   FINAL CLINICAL IMPRESSION(S) / ED DIAGNOSES   Final diagnoses:  Shortness of breath  Chest pain, unspecified type  Unstable angina (HCC)  Subtherapeutic anticoagulation     Rx / DC Orders   ED Discharge Orders     None        Note:  This document was prepared using Dragon voice recognition software and may include unintentional dictation errors.   Irean Hong, MD 09/20/23 [phone]

## 2023-09-20 NOTE — Assessment & Plan Note (Signed)
Chronic DVT s/p IVC

## 2023-09-20 NOTE — Assessment & Plan Note (Signed)
 Renal function at baseline

## 2023-09-20 NOTE — Assessment & Plan Note (Signed)
Elevated troponin, chest pain in the setting of septic therapeutic INR History of prior pulmonary embolism CTA with chronic pulmonary embolism Possible ACS Patient was scheduled for outpatient cardiac cath Will consult cardiology who might want to proceed with procedure as inpatient

## 2023-09-20 NOTE — ED Notes (Signed)
Pt to cath lab.

## 2023-09-21 ENCOUNTER — Encounter: Payer: Self-pay | Admitting: Cardiology

## 2023-09-21 ENCOUNTER — Other Ambulatory Visit (HOSPITAL_COMMUNITY): Payer: Self-pay

## 2023-09-21 ENCOUNTER — Other Ambulatory Visit: Payer: Self-pay

## 2023-09-21 ENCOUNTER — Telehealth: Payer: Self-pay | Admitting: Pharmacist

## 2023-09-21 DIAGNOSIS — I825Z3 Chronic embolism and thrombosis of unspecified deep veins of distal lower extremity, bilateral: Secondary | ICD-10-CM

## 2023-09-21 DIAGNOSIS — R7989 Other specified abnormal findings of blood chemistry: Secondary | ICD-10-CM

## 2023-09-21 DIAGNOSIS — I272 Pulmonary hypertension, unspecified: Secondary | ICD-10-CM

## 2023-09-21 DIAGNOSIS — I10 Essential (primary) hypertension: Secondary | ICD-10-CM

## 2023-09-21 DIAGNOSIS — I5032 Chronic diastolic (congestive) heart failure: Secondary | ICD-10-CM

## 2023-09-21 DIAGNOSIS — I5033 Acute on chronic diastolic (congestive) heart failure: Secondary | ICD-10-CM

## 2023-09-21 DIAGNOSIS — R791 Abnormal coagulation profile: Secondary | ICD-10-CM

## 2023-09-21 DIAGNOSIS — Z7985 Long-term (current) use of injectable non-insulin antidiabetic drugs: Secondary | ICD-10-CM

## 2023-09-21 DIAGNOSIS — Z6841 Body Mass Index (BMI) 40.0 and over, adult: Secondary | ICD-10-CM

## 2023-09-21 DIAGNOSIS — R570 Cardiogenic shock: Secondary | ICD-10-CM

## 2023-09-21 DIAGNOSIS — I2609 Other pulmonary embolism with acute cor pulmonale: Secondary | ICD-10-CM

## 2023-09-21 DIAGNOSIS — I50812 Chronic right heart failure: Secondary | ICD-10-CM | POA: Diagnosis not present

## 2023-09-21 DIAGNOSIS — R0600 Dyspnea, unspecified: Secondary | ICD-10-CM | POA: Diagnosis not present

## 2023-09-21 DIAGNOSIS — Z95828 Presence of other vascular implants and grafts: Secondary | ICD-10-CM

## 2023-09-21 DIAGNOSIS — E119 Type 2 diabetes mellitus without complications: Secondary | ICD-10-CM

## 2023-09-21 DIAGNOSIS — N1831 Chronic kidney disease, stage 3a: Secondary | ICD-10-CM

## 2023-09-21 DIAGNOSIS — E1122 Type 2 diabetes mellitus with diabetic chronic kidney disease: Secondary | ICD-10-CM

## 2023-09-21 DIAGNOSIS — I2782 Chronic pulmonary embolism: Secondary | ICD-10-CM

## 2023-09-21 LAB — ECHOCARDIOGRAM COMPLETE
AR max vel: 3.1 cm2
AV Area VTI: 3.53 cm2
AV Area mean vel: 2.8 cm2
AV Mean grad: 6 mm[Hg]
AV Peak grad: 10.5 mm[Hg]
Ao pk vel: 1.62 m/s
Area-P 1/2: 2.81 cm2
Height: 67 in
S' Lateral: 2.7 cm
Weight: 5820.14 [oz_av]

## 2023-09-21 LAB — BASIC METABOLIC PANEL
Anion gap: 9 (ref 5–15)
BUN: 25 mg/dL — ABNORMAL HIGH (ref 6–20)
CO2: 25 mmol/L (ref 22–32)
Calcium: 8.6 mg/dL — ABNORMAL LOW (ref 8.9–10.3)
Chloride: 103 mmol/L (ref 98–111)
Creatinine, Ser: 1.5 mg/dL — ABNORMAL HIGH (ref 0.61–1.24)
GFR, Estimated: 55 mL/min — ABNORMAL LOW (ref >60)
Glucose, Bld: 114 mg/dL — ABNORMAL HIGH (ref 70–99)
Potassium: 3.6 mmol/L (ref 3.5–5.1)
Sodium: 137 mmol/L (ref 135–145)

## 2023-09-21 LAB — GLUCOSE, CAPILLARY
Glucose-Capillary: 109 mg/dL — ABNORMAL HIGH (ref 70–99)
Glucose-Capillary: 110 mg/dL — ABNORMAL HIGH (ref 70–99)
Glucose-Capillary: 110 mg/dL — ABNORMAL HIGH (ref 70–99)
Glucose-Capillary: 116 mg/dL — ABNORMAL HIGH (ref 70–99)
Glucose-Capillary: 161 mg/dL — ABNORMAL HIGH (ref 70–99)
Glucose-Capillary: 165 mg/dL — ABNORMAL HIGH (ref 70–99)

## 2023-09-21 LAB — CBC
HCT: 41 % (ref 39.0–52.0)
Hemoglobin: 13.7 g/dL (ref 13.0–17.0)
MCH: 31.1 pg (ref 26.0–34.0)
MCHC: 33.4 g/dL (ref 30.0–36.0)
MCV: 93 fL (ref 80.0–100.0)
Platelets: 232 10*3/uL (ref 150–400)
RBC: 4.41 MIL/uL (ref 4.22–5.81)
RDW: 14.5 % (ref 11.5–15.5)
WBC: 4.6 10*3/uL (ref 4.0–10.5)
nRBC: 0 % (ref 0.0–0.2)

## 2023-09-21 LAB — COOXEMETRY PANEL
Carboxyhemoglobin: 1.9 % — ABNORMAL HIGH (ref 0.5–1.5)
Carboxyhemoglobin: 1.9 % — ABNORMAL HIGH (ref 0.5–1.5)
Methemoglobin: <0.7 % (ref 0.0–1.5)
Methemoglobin: >0.7 % (ref 0.0–1.5)
O2 Saturation: 82.5 %
O2 Saturation: 82.5 %
Total hemoglobin: 14.1 g/dL (ref 12.0–16.0)
Total hemoglobin: 14.1 g/dL (ref 12.0–16.0)
Total oxygen content: 80.5 %
Total oxygen content: 80.5 %

## 2023-09-21 LAB — PROTIME-INR
INR: 1.5 — ABNORMAL HIGH (ref 0.8–1.2)
Prothrombin Time: 18.6 s — ABNORMAL HIGH (ref 11.4–15.2)

## 2023-09-21 LAB — HEPARIN LEVEL (UNFRACTIONATED)
Heparin Unfractionated: 0.47 [IU]/mL (ref 0.30–0.70)
Heparin Unfractionated: 0.41 [IU]/mL (ref 0.30–0.70)

## 2023-09-21 LAB — MAGNESIUM: Magnesium: 1.9 mg/dL (ref 1.7–2.4)

## 2023-09-21 MED ORDER — ADEMPAS 1 MG PO TABS
1.0000 | ORAL_TABLET | Freq: Three times a day (TID) | ORAL | 1 refills | Status: DC
  Filled 2023-09-21: qty 90, fill #0

## 2023-09-21 MED ORDER — SODIUM CHLORIDE 0.9% FLUSH
10.0000 mL | Freq: Two times a day (BID) | INTRAVENOUS | Status: DC
Start: 2023-09-21 — End: 2023-09-30
  Administered 2023-09-21 – 2023-09-24 (×6): 10 mL
  Administered 2023-09-25: 40 mL
  Administered 2023-09-25: 10 mL
  Administered 2023-09-26: 40 mL
  Administered 2023-09-26 – 2023-09-30 (×8): 10 mL

## 2023-09-21 MED ORDER — FUROSEMIDE 10 MG/ML IJ SOLN
80.0000 mg | Freq: Two times a day (BID) | INTRAMUSCULAR | Status: DC
  Administered 2023-09-21: 80 mg via INTRAVENOUS
  Filled 2023-09-21 (×2): qty 8

## 2023-09-21 MED ORDER — SODIUM CHLORIDE 0.9% FLUSH: 10.0000 mL | INTRAVENOUS | Status: DC | PRN

## 2023-09-21 MED ORDER — HEPARIN BOLUS VIA INFUSION
1600.0000 [IU] | Freq: Once | INTRAVENOUS | Status: AC
  Administered 2023-09-21: 1600 [IU] via INTRAVENOUS
  Filled 2023-09-21: qty 1600

## 2023-09-21 MED ORDER — FUROSEMIDE 10 MG/ML IJ SOLN: 80.0000 mg | Freq: Two times a day (BID) | INTRAMUSCULAR | Status: DC

## 2023-09-21 MED ORDER — WARFARIN SODIUM 7.5 MG PO TABS
7.5000 mg | ORAL_TABLET | Freq: Once | ORAL | Status: AC
  Administered 2023-09-21: 7.5 mg via ORAL
  Filled 2023-09-21: qty 1

## 2023-09-21 NOTE — Plan of Care (Signed)
  Problem: Coping: Goal: Ability to adjust to condition or change in health will improve Outcome: Progressing   Problem: Fluid Volume: Goal: Ability to maintain a balanced intake and output will improve Outcome: Progressing   Problem: Health Behavior/Discharge Planning: Goal: Ability to identify and utilize available resources and services will improve Outcome: Progressing Goal: Ability to manage health-related needs will improve Outcome: Progressing   Problem: Metabolic: Goal: Ability to maintain appropriate glucose levels will improve Outcome: Progressing   Problem: Nutritional: Goal: Maintenance of adequate nutrition will improve Outcome: Progressing   Problem: Skin Integrity: Goal: Risk for impaired skin integrity will decrease Outcome: Progressing   Problem: Tissue Perfusion: Goal: Adequacy of tissue perfusion will improve Outcome: Progressing   Problem: Education: Goal: Understanding of cardiac disease, CV risk reduction, and recovery process will improve Outcome: Progressing   Problem: Activity: Goal: Ability to tolerate increased activity will improve Outcome: Progressing   Problem: Cardiac: Goal: Ability to achieve and maintain adequate cardiovascular perfusion will improve Outcome: Progressing   Problem: Health Behavior/Discharge Planning: Goal: Ability to safely manage health-related needs after discharge will improve Outcome: Progressing   Problem: Activity: Goal: Ability to return to baseline activity level will improve Outcome: Progressing   Problem: Cardiovascular: Goal: Ability to achieve and maintain adequate cardiovascular perfusion will improve Outcome: Progressing Goal: Vascular access site(s) Level 0-1 will be maintained Outcome: Progressing   Problem: Health Behavior/Discharge Planning: Goal: Ability to safely manage health-related needs after discharge will improve Outcome: Progressing   Problem: Education: Goal: Knowledge of General  Education information will improve Description: Including pain rating scale, medication(s)/side effects and non-pharmacologic comfort measures Outcome: Progressing   Problem: Health Behavior/Discharge Planning: Goal: Ability to manage health-related needs will improve Outcome: Progressing   Problem: Clinical Measurements: Goal: Will remain free from infection Outcome: Progressing Goal: Diagnostic test results will improve Outcome: Progressing   Problem: Coping: Goal: Level of anxiety will decrease Outcome: Progressing   Problem: Elimination: Goal: Will not experience complications related to bowel motility Outcome: Progressing Goal: Will not experience complications related to urinary retention Outcome: Progressing   Problem: Pain Managment: Goal: General experience of comfort will improve and/or be controlled Outcome: Progressing

## 2023-09-21 NOTE — Plan of Care (Signed)
  Problem: Education: Goal: Ability to describe self-care measures that may prevent or decrease complications (Diabetes Survival Skills Education) will improve Outcome: Progressing Goal: Individualized Educational Video(s) Outcome: Progressing   Problem: Coping: Goal: Ability to adjust to condition or change in health will improve Outcome: Progressing   Problem: Fluid Volume: Goal: Ability to maintain a balanced intake and output will improve Outcome: Progressing   Problem: Health Behavior/Discharge Planning: Goal: Ability to identify and utilize available resources and services will improve Outcome: Progressing Goal: Ability to manage health-related needs will improve Outcome: Progressing   Problem: Fluid Volume: Goal: Ability to maintain a balanced intake and output will improve Outcome: Progressing   Problem: Health Behavior/Discharge Planning: Goal: Ability to identify and utilize available resources and services will improve Outcome: Progressing Goal: Ability to manage health-related needs will improve Outcome: Progressing   Problem: Metabolic: Goal: Ability to maintain appropriate glucose levels will improve Outcome: Progressing   Problem: Skin Integrity: Goal: Risk for impaired skin integrity will decrease Outcome: Progressing   Problem: Tissue Perfusion: Goal: Adequacy of tissue perfusion will improve Outcome: Progressing   Problem: Cardiac: Goal: Ability to achieve and maintain adequate cardiovascular perfusion will improve Outcome: Progressing   Problem: Health Behavior/Discharge Planning: Goal: Ability to safely manage health-related needs after discharge will improve Outcome: Progressing

## 2023-09-21 NOTE — Progress Notes (Signed)
Advanced Heart Failure Rounding Note  HF Cardiologist: Dr. 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 Awaiting PICC placement.  Dyspnea and orthopnea improving.    Objective:   Weight Range: (!) 165 kg Body mass index is 56.97 kg/m.   Vital Signs:   Temp:  [97.7 F (36.5 C)-98.8 F (37.1 C)] 98.8 F (37.1 C) (01/22 0400) Pulse Rate:  [0-87] 87 (01/22 0600) Resp:  [15-29] 17 (01/22 0600) BP: (89-166)/(60-115) 110/71 (01/22 0600) SpO2:  [87 %-100 %] 96 % (01/22 0600) FiO2 (%):  [32 %] 32 % (01/21 1038) Weight:  [156.5 kg-165 kg] 165 kg (01/22 0419) Last BM Date :  (PTA)  Weight change: Filed Weights   09/20/23 0748 09/20/23 1755 09/21/23 0419  Weight: (!) 156.5 kg (!) 165 kg (!) 165 kg    Intake/Output:   Intake/Output Summary (Last 24 hours) at 09/21/2023 0731 Last data filed at 09/21/2023 0500 Gross per 24 hour  Intake 738.95 ml  Output 3800 ml  Net -3061.05 ml      Physical Exam    General:  Sitting up in bed. No distress. HEENT: Normal Neck: Supple. JVP difficult d/t thick neck .  Cor: PMI nondisplaced. Regular rate & rhythm. No rubs, gallops or murmurs. Lungs: Clear Abdomen: obese, soft, nontender, nondistended.  Extremities: No cyanosis, clubbing, rash, 1-2+ edema, chronic venous stasis changes Neuro: Alert & orientedx3. Affect pleasant   Telemetry   SR 70s  Labs    CBC Recent Labs    09/19/23 2216 09/20/23 0814 09/21/23 0430  WBC 4.8  --  4.6  NEUTROABS 1.8  --   --   HGB 15.1 14.6  14.6 13.7  HCT 49.3 43.0  43.0 41.0  MCV 99.8  --  93.0  PLT 234  --  232   Basic Metabolic Panel Recent Labs    40/98/11 0054 09/20/23 0814 09/21/23 0430  NA 137 142  141 137  K 3.8 3.9  4.0 3.6  CL 102  --  103  CO2 25  --  25  GLUCOSE 101*  --  114*  BUN 24*  --  25*  CREATININE 1.55*  --  1.50*  CALCIUM 8.8*  --  8.6*  MG  --   --  1.9   Liver Function  Tests Recent Labs    09/20/23 0054  AST 22  ALT 23  ALKPHOS 50  BILITOT 0.9  PROT 7.5  ALBUMIN 3.6   No results for input(s): "LIPASE", "AMYLASE" in the last 72 hours. Cardiac Enzymes No results for input(s): "CKTOTAL", "CKMB", "CKMBINDEX", "TROPONINI" in the last 72 hours.  BNP: BNP (last 3 results) Recent Labs    07/19/23 0310 08/19/23 0213 09/19/23 2216  BNP 298.0* 267.8* 280.4*    ProBNP (last 3 results) No results for input(s): "PROBNP" in the last 8760 hours.   D-Dimer No results for input(s): "DDIMER" in the last 72 hours. Hemoglobin A1C No results for input(s): "HGBA1C" in the last 72 hours. Fasting Lipid Panel No results for input(s): "CHOL", "HDL", "LDLCALC", "TRIG", "CHOLHDL", "LDLDIRECT" in the last 72 hours. Thyroid Function Tests No results for input(s): "TSH", "T4TOTAL", "T3FREE", "THYROIDAB" in the last 72 hours.  Invalid input(s): "FREET3"  Other results:   Imaging    [place] EKG SITE RITE Result Date: 09/20/2023 If Site Rite image not attached, placement could not be confirmed due to current cardiac rhythm.  [place] EKG SITE  RITE Result Date: 09/20/2023 If [facility] image not attached, placement could not be confirmed due to current cardiac rhythm.    Medications:     Scheduled Medications:  aspirin EC  81 mg Oral Daily   Chlorhexidine Gluconate Cloth  6 each Topical Daily   digoxin  0.0625 mg Oral Daily   insulin aspart  0-20 Units Subcutaneous Q4H   sildenafil  20 mg Oral TID   spironolactone  25 mg Oral Daily   Warfarin - Pharmacist Dosing Inpatient   Does not apply q1600    Infusions:  heparin 1,750 Units/hr (09/21/23 0500)   milrinone 0.25 mcg/kg/min (09/21/23 0500)    PRN Medications: acetaminophen, albuterol, nitroGLYCERIN, ondansetron (ZOFRAN) IV, mouth rinse    Patient Profile   53 y.o. 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 Awaiting PICC for CVP and CO-Ox monitoring. Depending on his progress, may be a candidate for home milrinone -Diuresing well. Continue 80 mg lasix IV BID. -Started sildenafil 20 mg TID. Eventually transition to riociguat. Will need PA. -Stopped beta blocker with low-output -Continue spiro 25 mg daily -Off losartan with soft BP -Continue digoxin 0.0625 mg daily   2. 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  -Repeat V/Q scan has been ordered -On heparin gtt.  -Resumed Warfarin. INR recently supratherapeutic. 1.5 today   3. Atrial fibrillation/flutter -ECG on presentation with sinus tach -Stopped beta blocker as above -Heparin gtt   4. CKD IIIa -Baseline Scr seems to be 1.4-1.7 -Scr 1.5 today -Support CO with milrinone      Length of Stay: 1  Tallie Dodds N, PA-C  09/21/2023, 7:31 AM  Advanced Heart Failure Team Pager [phone] (M-F; 7a - 5p)  Please contact [facility] for night-coverage after hours (5p -7a ) and weekends on [email or web address]

## 2023-09-21 NOTE — Evaluation (Signed)
Physical Therapy Evaluation Patient Details Name: Ryan [name] MRN: [id number] DOB: 22-Dec-1970 Today's Date: 09/21/2023  History of Present Illness  Ryan [name] is a 53 y.o. male with medical history significant for morbid obesity, CKD 3, chronic lower extremity wounds, CHF with preserved EF, DM2, PE/DVT status [name] IVC on Coumadin since April 2024, (failed Eliquis and Xarelto in the past) admitted 1 month ago with CHF exacerbation, currently scheduled for cardiac cath today 1/21, who presented to the ED with a 2-day history of shortness of breath and sharp chest pain with deep inspiration. S/P cardiac cath 09/20/23.  Clinical Impression  Patient received resting in bed. He is pleasant and agreeable to PT/OT assessment. Patient is mod I with bed mobility. Transfers with supervision and ambulated ~30 feet without AD and supervision. He ambulated on room air with sats dropping to 89%. Assist for management of lines only. Patient will continue to benefit from skilled PT to improve endurance and strength for return home.             If plan is discharge home, recommend the following: Assist for transportation;Help with stairs or ramp for entrance   Can travel by private vehicle   yes     Equipment Recommendations None recommended by PT  Recommendations for Other Services       Functional Status Assessment Patient has had a recent decline in their functional status and demonstrates the ability to make significant improvements in function in a reasonable and predictable amount of time.     Precautions / Restrictions Precautions Precaution Comments: mod fall Restrictions Weight Bearing Restrictions Per Provider Order: No      Mobility  Bed Mobility Overal bed mobility: Modified Independent             General bed mobility comments: increased effort and time, but no assist needed    Transfers Overall transfer level: Independent Equipment used: None                     Ambulation/Gait Ambulation/Gait assistance: Supervision Gait Distance (Feet): 30 Feet Assistive device: None Gait Pattern/deviations: Step-through pattern, Decreased step length - right, Decreased step length - left, Decreased stride length Gait velocity: decr     General Gait Details: no lob, ambulated at slower pace, supervision  Stairs            Wheelchair Mobility     Tilt Bed    Modified Rankin (Stroke Patients Only)       Balance Overall balance assessment: No apparent balance deficits (not formally assessed)                                           Pertinent Vitals/Pain Pain Assessment Pain Assessment: No/denies pain    Home Living Family/patient expects to be discharged to:: Private residence Living Arrangements: Children (lives with his 28 year old daughter) Available Help at Discharge: Family;Available PRN/intermittently Type of Home: House Home Access: Level entry     Alternate Level Stairs-Number of Steps: office upstairs, flight Home Layout: Two level;Able to live on main level with bedroom/bathroom Home Equipment: Shower seat - built in      Prior Function Prior Level of Function : Independent/Modified Independent;Driving;Working/employed             Mobility Comments: Patient is on disability currently, but drives, was independent with activities PTA. ADLs Comments:  independent     Extremity/Trunk Assessment   Upper Extremity Assessment Upper Extremity Assessment: Overall WFL for tasks assessed    Lower Extremity Assessment Lower Extremity Assessment: Overall WFL for tasks assessed    Cervical / Trunk Assessment Cervical / Trunk Assessment: Normal  Communication   Communication Communication: No apparent difficulties Cueing Techniques: Verbal cues  Cognition Arousal: Alert Behavior During Therapy: WFL for tasks assessed/performed Overall Cognitive Status: Within Functional Limits for tasks assessed                                  General Comments: patient very pleasant and cooperative        General Comments      Exercises     Assessment/Plan    PT Assessment Patient needs continued PT services  PT Problem List Decreased strength;Decreased activity tolerance;Decreased mobility;Obesity;Cardiopulmonary status limiting activity       PT Treatment Interventions Gait training;Stair training;Functional mobility training;Therapeutic activities;Therapeutic exercise;Neuromuscular re-education;Patient/family education    PT Goals (Current goals can be found in the Care Plan section)  Acute Rehab PT Goals Patient Stated Goal: to improve, return home PT Goal Formulation: With patient Time For Goal Achievement: 09/30/23 Potential to Achieve Goals: Good    Frequency Min 1X/week     Co-evaluation               AM-PAC PT "6 Clicks" Mobility  Outcome Measure Help needed turning from your back to your side while in a flat bed without using bedrails?: None Help needed moving from lying on your back to sitting on the side of a flat bed without using bedrails?: None Help needed moving to and from a bed to a chair (including a wheelchair)?: A Little Help needed standing up from a chair using your arms (e.g., wheelchair or bedside chair)?: A Little Help needed to walk in hospital room?: A Little Help needed climbing 3-5 steps with a railing? : A Little 6 Click Score: 20    End of Session Equipment Utilized During Treatment: Oxygen Activity Tolerance: Patient tolerated treatment well Patient left: in bed;with call bell/phone within reach Nurse Communication: Mobility status PT Visit Diagnosis: Muscle weakness (generalized) (M62.81);Difficulty in walking, not elsewhere classified (R26.2)    Time: 1610-9604 PT Time Calculation (min) (ACUTE ONLY): 11 min   Charges:   PT Evaluation $PT Eval Low Complexity: 1 Low   PT General Charges $$ ACUTE PT VISIT: 1  Visit         Arelene Moroni, PT, GCS 09/21/23,3:10 PM

## 2023-09-21 NOTE — Progress Notes (Addendum)
ANTICOAGULATION CONSULT NOTE  Pharmacy Consult for heparin infusion and resume PTA warfarin  Indication: ACS/STEMI,  VTE treatment  Allergies  Allergen Reactions   Jardiance [Empagliflozin]     Caused infection    Metrizamide Other (See Comments)    Kidney failure requiring dialysis    Clindamycin/Lincomycin Rash   Lincomycin Rash   Sulfa Antibiotics Rash    Patient Measurements: Height: 5\' 7"  (170.2 cm) Weight: (!) 165 kg (363 lb 12.1 oz) IBW/kg (Calculated) : 66.1 Heparin Dosing Weight: 104.8 kg  Vital Signs: Temp: 98 F (36.7 C) (01/21 2000) Temp Source: Axillary (01/21 1755) BP: 107/69 (01/21 2200) Pulse Rate: 80 (01/21 2200)  Labs: Recent Labs    09/19/23 1351 09/19/23 2216 09/20/23 0054 09/20/23 0814 09/20/23 1430 09/20/23 2244  HGB  --  15.1  --  14.6  14.6  --   --   HCT  --  49.3  --  43.0  43.0  --   --   PLT  --  234  --   --   --   --   LABPROT 17.7* 16.7*  --   --   --   --   INR 1.7* 1.3*  --   --   --   --   HEPARINUNFRC  --   --   --   --  0.10* 0.21*  CREATININE  --   --  1.55*  --   --   --   TROPONINIHS  --  68* 86*  --   --   --     Estimated Creatinine Clearance: 82.4 mL/min (A) (by C-G formula based on SCr of 1.55 mg/dL (H)).   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  Regular the workup has not been forthcoming) previously followed by [facility] Isaiah [facility] at [facility]   Hepatic steatosis    Hypertension    Morbid obesity with BMI of 50.0-59.9, adult (HCC)    Oxygen deficiency    Peripheral vascular disease (HCC)    Presence of IVC filter    Has  2 IVC filters with significant thrombus burden superior to the filter.   Pulmonary embolus (HCC) 12/2016   Initially just treated with anticoagulation, but in February 2023 in April 2024 treated with EKOS thrombectomy for submassive PE.   Ventricular trigeminy    Weakness of both lower limbs     Assessment: Pt is a 53 yo male w/ hx of DVT/PE on warfarin (last dose 1/19 @ 2345) presenting to ED c/o SOB x 1.5 days found with elevated troponin I level trending up.  Pt has/had cardiac cath procedure scheduled for today at 0730. Hx: multiple DVTs/PES dating back to 53s. Per chart review, has MTHFR gene mutation and was previously followed by Hematology (Dr. Isaiah Serge)   -patient on warfarin 7.5mg  daily PTA- last dose per Med Rec in 09/19/23 in evening  HL: 1/21 1430 HL= 0.10  SUBtherapeutic (drawn ~ 4 hours after heparin drip restart from procedure) 1/21 2244 HL 0.21, subtherapeutic   INR: 1/20 INR 1.3    (  per Med rec pt took 7.5 mg dose PTA) 1/21 no INR -resume warfarin 7.5mg  x 1 , check INR in am   Goal of Therapy:  Heparin level 0.3-0.7 units/ml Monitor platelets by anticoagulation protocol: Yes   Plan:  Heparin: Bolus 1600 units x 1 Increase heparin infusion to 1750 units/hr Will check HL in 6 hr after rate change Will bridge with heparin drip until INR therapeutic with warfarin CBC daily while on heparin  Warfarin: Pt to resume warfarin for VTE treatment Will resume Warfarin 7.5 mg po x 1  F/u INR daily and CBC per protocol  Otelia Sergeant, PharmD, [facility] 09/21/2023 12:03 AM

## 2023-09-21 NOTE — Progress Notes (Signed)
ANTICOAGULATION CONSULT NOTE  Pharmacy Consult for heparin infusion and resume PTA warfarin  Indication: ACS/STEMI,  VTE treatment  Allergies  Allergen Reactions   Jardiance [Empagliflozin]     Caused infection    Metrizamide Other (See Comments)    Kidney failure requiring dialysis    Clindamycin/Lincomycin Rash   Lincomycin Rash   Sulfa Antibiotics Rash    Patient Measurements: Height: 5\' 7"  (170.2 cm) Weight: (!) 165 kg (363 lb 12.1 oz) IBW/kg (Calculated) : 66.1 Heparin Dosing Weight: 104.8 kg  Vital Signs: Temp: 98.8 F (37.1 C) (01/22 0400) Temp Source: Axillary (01/21 1755) BP: 97/71 (01/22 0500) Pulse Rate: 74 (01/22 0500)  Labs: Recent Labs    09/19/23 1351 09/19/23 2216 09/19/23 2216 09/20/23 0054 09/20/23 0814 09/20/23 1430 09/20/23 2244 09/21/23 0430  HGB  --  15.1   < >  --  14.6  14.6  --   --  13.7  HCT  --  49.3  --   --  43.0  43.0  --   --  41.0  PLT  --  234  --   --   --   --   --  232  LABPROT 17.7* 16.7*  --   --   --   --   --  18.6*  INR 1.7* 1.3*  --   --   --   --   --  1.5*  HEPARINUNFRC  --   --   --   --   --  0.10* 0.21* 0.47  CREATININE  --   --   --  1.55*  --   --   --  1.50*  TROPONINIHS  --  68*  --  86*  --   --   --   --    < > = values in this interval not displayed.    Estimated Creatinine Clearance: 85.1 mL/min (A) (by C-G formula based on SCr of 1.5 mg/dL (H)).   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  Regular the workup has not been forthcoming) previously followed by Dr. Isaiah Serge at [facility]   Hepatic steatosis     Hypertension    Morbid obesity with BMI of 50.0-59.9, adult (HCC)    Oxygen deficiency    Peripheral vascular disease (HCC)    Presence of IVC filter    Has 2 IVC filters with significant thrombus burden superior to the filter.   Pulmonary embolus (HCC) 12/2016   Initially just treated with anticoagulation, but in February 2023 in April 2024 treated with EKOS thrombectomy for submassive PE.   Ventricular trigeminy    Weakness of both lower limbs     Assessment: Pt is a 53 yo male w/ hx of DVT/PE on warfarin (last dose 1/19 @ 2345) presenting to ED c/o SOB x 1.5 days found with elevated troponin I level trending up.  Pt has/had cardiac cath procedure scheduled for today at 0730. Hx: multiple DVTs/PES dating back to 60s. Per chart review, has MTHFR gene mutation and was previously followed by Hematology (Dr.  Moll)   -patient on warfarin 7.5mg  daily PTA- last dose per Med Rec in 09/19/23 in evening  HL: 1/21 1430 HL= 0.10  SUBtherapeutic (drawn ~ 4 hours after heparin drip restart from procedure) 1/21 2244 HL 0.21, subtherapeutic 1/22 0430 HL 0.47, therapeutic x 1   INR: 1/20 INR 1.3    (per Med rec pt took 7.5 mg dose PTA) 1/21 no INR -resume warfarin 7.5mg  x 1 , check INR in am 1/22 INR 1.5   Goal of Therapy:  Heparin level 0.3-0.7 units/ml Monitor platelets by anticoagulation protocol: Yes   Plan:  Heparin: Continue heparin infusion at 1750 units/hr Will recheck HL in 6 hr to confirm Will bridge with heparin drip until INR therapeutic with warfarin CBC daily while on heparin  Warfarin: Pt to resume warfarin for VTE treatment Will resume Warfarin 7.5 mg po x 1  F/u INR daily and CBC per protocol  Otelia Sergeant, PharmD, MBA 09/21/2023 6:00 AM

## 2023-09-21 NOTE — Progress Notes (Addendum)
Blood sugar of 165 is an incorrect reading. True blood sugar was the 116 at 0404

## 2023-09-21 NOTE — Evaluation (Signed)
Occupational Therapy Evaluation Patient Details Name: Ryan [name] MRN: [id number] DOB: June 23, 1971 Today's Date: 09/21/2023   History of Present Illness 52 y.o. male  who presented to the ED with a 2-day history of shortness of breath and sharp chest pain with deep inspiration. R cardiac cath yesterday 1/21. 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   Clinical Impression   Pt was seen for OT evaluation this date. Prior to hospital admission, pt was living at home with his daughter and reports being IND with ADLs, IADLs and mobility with no AD use. He has not been up to his second level where his office is located in the last month and a half d/t his SOB/weakness.  Pt presents to acute OT demonstrating impaired ADL performance and functional mobility 2/2 weakness/low activity tolerance (See OT problem list for additional functional deficits). Pt currently requires MOD I with bed mobility, IND with STS and SUP for in room mobility during session. He had already been up to the toilet in the room with nursing earlier in the day. Ambulated on RA and maintained above 90%, but with return to seated EOB dropped to 87% and  was replaced. Pt would benefit from skilled OT services to address noted impairments and functional limitations (see below for any additional details) in order to maximize safety and independence while minimizing falls risk and caregiver burden. Pt may benefit from [facility] consult to maximize carryover of ECS and AE/AD use in the home as needed, although he may not need it as he progresses and is medically stable.        If plan is discharge home, recommend the following: A little help with walking and/or transfers;A little help with bathing/dressing/bathroom;Assistance with cooking/housework;Assist for transportation;Help with stairs or ramp for  entrance    Functional Status Assessment  Patient has had a recent decline in their functional status and demonstrates the ability to make significant improvements in function in a reasonable and predictable amount of time.  Equipment Recommendations  None recommended by OT    Recommendations for Other Services       Precautions / Restrictions Precautions Precautions: Fall Precaution Comments: mod fall Restrictions Weight Bearing Restrictions Per Provider Order: No      Mobility Bed Mobility Overal bed mobility: Modified Independent             General bed mobility comments: increased effort and time, but no assist needed    Transfers Overall transfer level: Independent Equipment used: None               General transfer comment: IND with STS and SUP for in room mobility around the room      Balance Overall balance assessment: No apparent balance deficits (not formally assessed)                                         ADL either performed or assessed with clinical judgement   ADL Overall ADL's : Needs assistance/impaired                                     Functional mobility during ADLs: Supervision/safety General ADL Comments: MOD I with bed mobility, IND  with STS and SUP for in room mobility; likely CGA/SUP for ADLs with increased time and effort d/t SOB     Vision         Perception         Praxis         Pertinent Vitals/Pain Pain Assessment Pain Assessment: No/denies pain     Extremity/Trunk Assessment Upper Extremity Assessment Upper Extremity Assessment: Overall WFL for tasks assessed   Lower Extremity Assessment Lower Extremity Assessment: Overall WFL for tasks assessed   Cervical / Trunk Assessment Cervical / Trunk Assessment: Normal   Communication Communication Communication: No apparent difficulties Cueing Techniques: Verbal cues   Cognition Arousal: Alert Behavior During Therapy:  WFL for tasks assessed/performed Overall Cognitive Status: Within Functional Limits for tasks assessed                                 General Comments: patient very pleasant and cooperative     General Comments  VSS, RA for mobility dropped to 87% with return to seated EOB so replaced [place]    Exercises Other Exercises Other Exercises: Edu on role of OT in acute setting and importance of therapy to maximize strength/IND/safety.   Shoulder Instructions      Home Living Family/patient expects to be discharged to:: Private residence Living Arrangements: Children (lives with his 61 year old daughter) Available Help at Discharge: Family;Available PRN/intermittently Type of Home: House Home Access: Level entry     Home Layout: Two level;Able to live on main level with bedroom/bathroom Alternate Level Stairs-Number of Steps: office upstairs, flight Alternate Level Stairs-Rails: Left;Right;Can reach both Bathroom Shower/Tub: Producer, television/film/video: Standard     Home Equipment: Shower seat - built in          Prior Functioning/Environment Prior Level of Function : Independent/Modified Independent;Driving;Working/employed             Mobility Comments: Patient is on disability currently, but drives, was independent with activities PTA. hasn't been upstairs to his office in 1.5 months ADLs Comments: independent        OT Problem List: Decreased activity tolerance      OT Treatment/Interventions: Self-care/ADL training;Therapeutic exercise;Patient/family education;Energy conservation;DME and/or AE instruction    OT Goals(Current goals can be found in the care plan section) Acute Rehab OT Goals Patient Stated Goal: improve breathing OT Goal Formulation: With patient Time For Goal Achievement: 10/05/23 Potential to Achieve Goals: Good ADL Goals Pt Will Perform Lower Body Bathing: sitting/lateral leans;with adaptive equipment;sit to/from stand;with  modified independence Pt Will Perform Lower Body Dressing: with modified independence;sitting/lateral leans;sit to/from stand Pt Will Transfer to Toilet: with modified independence;ambulating;regular height toilet Pt Will Perform Toileting - Clothing Manipulation and hygiene: with modified independence;sit to/from stand;sitting/lateral leans Additional ADL Goal #1: Pt will verbalize/demo use of ECS and PLB to maximize IND and prevent overexertion 3/3 trials during ADL performance/transfers.  OT Frequency: Min 1X/week    Co-evaluation PT/OT/SLP Co-Evaluation/Treatment: Yes Reason for Co-Treatment: For patient/therapist safety PT goals addressed during session: Mobility/safety with mobility OT goals addressed during session: ADL's and self-care      AM-PAC OT "6 Clicks" Daily Activity     Outcome Measure Help from another person eating meals?: None Help from another person taking care of personal grooming?: None Help from another person toileting, which includes using toliet, bedpan, or urinal?: A Little Help from another person bathing (including washing, rinsing, drying)?: A  Little Help from another person to put on and taking off regular upper body clothing?: None Help from another person to put on and taking off regular lower body clothing?: A Little 6 Click Score: 21   End of Session Nurse Communication: Mobility status  Activity Tolerance: Patient tolerated treatment well Patient left: in bed;with call bell/phone within reach;with bed alarm set  OT Visit Diagnosis: Other abnormalities of gait and mobility (R26.89);Other (comment) (low activity tolerance)                Time: 1324-4010 OT Time Calculation (min): 13 min Charges:  OT General Charges $OT Visit: 1 Visit OT Evaluation $OT Eval Low Complexity: 1 Low  Johnetta Sloniker, OTR/L 09/21/23, 3:34 PM  Nateisha Moyd E Staisha Winiarski 09/21/2023, 3:31 PM

## 2023-09-21 NOTE — Progress Notes (Signed)
Peripherally Inserted Central Catheter Placement  The IV Nurse has discussed with the patient and/or persons authorized to consent for the patient, the purpose of this procedure and the potential benefits and risks involved with this procedure.  The benefits include less needle sticks, lab draws from the catheter, and the patient may be discharged home with the catheter. Risks include, but not limited to, infection, bleeding, blood clot (thrombus formation), and puncture of an artery; nerve damage and irregular heartbeat and possibility to perform a PICC exchange if needed/ordered by physician.  Alternatives to this procedure were also discussed.  Bard Power PICC patient education guide, fact sheet on infection prevention and patient information card has been provided to patient /or left at bedside.    PICC Placement Documentation  PICC Double Lumen 09/21/23 Right Brachial 45 cm 0 cm (Active)  Indication for Insertion or Continuance of Line Prolonged intravenous therapies 09/21/23 1640  Exposed Catheter (cm) 0 cm 09/21/23 1640  Site Assessment Clean, Dry, Intact 09/21/23 1640  Lumen #1 Status Flushed;Saline locked;Blood return noted 09/21/23 1640  Lumen #2 Status Flushed;Blood return noted;Saline locked 09/21/23 1640  Dressing Type Transparent;Securing device 09/21/23 1640  Dressing Status Antimicrobial disc/dressing in place 09/21/23 1640  Line Adjustment (NICU/IV Team Only) No 09/21/23 1640  Dressing Change Due 09/28/23 09/21/23 1640       Romie Jumper 09/21/2023, 4:41 PM

## 2023-09-21 NOTE — Progress Notes (Addendum)
ANTICOAGULATION CONSULT NOTE  Pharmacy Consult for heparin infusion and resume PTA warfarin  Indication: CTEPH / recurrent DVT / PE  Patient Measurements: Height: 5\' 7"  (170.2 cm) Weight: (!) 165 kg (363 lb 12.1 oz) IBW/kg (Calculated) : 66.1 Heparin Dosing Weight: 104.8 kg  Labs: Recent Labs    09/19/23 1351 09/19/23 2216 09/19/23 2216 09/20/23 0054 09/20/23 0814 09/20/23 1430 09/20/23 2244 09/21/23 0430  HGB  --  15.1   < >  --  14.6  14.6  --   --  13.7  HCT  --  49.3  --   --  43.0  43.0  --   --  41.0  PLT  --  234  --   --   --   --   --  232  LABPROT 17.7* 16.7*  --   --   --   --   --  18.6*  INR 1.7* 1.3*  --   --   --   --   --  1.5*  HEPARINUNFRC  --   --   --   --   --  0.10* 0.21* 0.47  CREATININE  --   --   --  1.55*  --   --   --  1.50*  TROPONINIHS  --  68*  --  86*  --   --   --   --    < > = values in this interval not displayed.    Estimated Creatinine Clearance: 85.1 mL/min (A) (by C-G formula based on SCr of 1.5 mg/dL (H)).   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  Regular the workup has not been forthcoming) previously [facility]   Hepatic steatosis    Hypertension    Morbid obesity with BMI of 50.0-59.9, adult (HCC)    Oxygen deficiency    Peripheral vascular disease (HCC)    Presence of IVC filter    Has 2 IVC filters with significant thrombus burden superior to the filter.   Pulmonary embolus (HCC) 12/2016   Initially just treated with anticoagulation, but in February 2023 in April 2024 treated with EKOS thrombectomy for  submassive PE.   Ventricular trigeminy    Weakness of both lower limbs     Assessment: Pt is a 53 yo male w/ hx of DVT/PE on warfarin (last dose 1/19 @ 2345) presenting to ED c/o SOB x 1.5 days found with elevated troponin I level trending up.  Pt has/had cardiac cath procedure scheduled for today at 0730. 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 Per chart review, has MTHFR gene mutation and was previously followed by Hematology (Dr. Isaiah Serge)   Last note from anticoagulation clinic on 09/15/23 noted INR goal 2.5-3 given recurrent thromboembolic events with INR < 2. Patient with supratherapeutic INRs on 1/16 and 1/17 so dose was decreased from 7.5 mg daily to 5 mg daily and patient was instructed to hold warfarin until 1/19.  HL: 1/21 1430 HL= 0.10  SUBtherapeutic (drawn ~ 4 hours after heparin drip restart  from procedure) 1/21 2244 HL 0.21, subtherapeutic 1/22 0430 HL 0.47, therapeutic x 1 1/22 1121 HL 0.41, therapeutic x 2  INR: 1/20 INR 1.3 (per med rec pt took 7.5 mg dose PTA) 1/21 no INR  resume warfarin 7.5mg  x 1 , check INR in am 1/22 INR 1.5  Goal of Therapy:  Heparin level 0.3-0.7 units/ml INR 2.5 - 3 Monitor platelets by anticoagulation protocol: Yes   Plan:  Heparin: --Continue heparin infusion at 1750 units/hr --Will recheck HL tomorrow AM --Will bridge with heparin drip until INR therapeutic with warfarin --CBC daily while on heparin  Warfarin: --Warfarin level is LOW and suspect this is due to held doses (likely 1/16 - 1/18) given supratherapeutic INRs in outpatient setting --Will give warfarin 7.5 mg PO x 1 dose tonight --INR daily and CBC per protocol  Tressie Ellis 09/21/2023 8:09 AM

## 2023-09-21 NOTE — Progress Notes (Signed)
Progress Note   Patient: Ryan [name]:[id number] DOB: 1971/02/08 DOA: 09/20/2023     1 DOS: the patient was seen and examined on 09/21/2023   Brief hospital course: Ryan [name] is a 54 y.o. 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   Patient is admitted for further management evaluation of acute dyspnea, elevated troponin.    Assessment and Plan: * Acute on chronic diastolic CHF Acute hypoxic respiratory failure Severe pulmonary hypertension History of prior pulmonary embolism CTA with chronic pulmonary embolism. Cardiology evaluated the patient recommended right heart cath which showed severely elevated PA pressures and reduced cardiac index.  Patient is started on IV milrinone and sildenafil therapy.   Patient will be continued on heparin drip per protocol. IV Lasix 80 mg twice daily as per heart failure team. He is currently being monitored in stepdown unit. Patient is currently on 4-5 L supplemental oxygen to maintain saturation greater than 90%. Patient will be continued on digoxin, spironolactone. Hold metoprolol, losartan due to borderline low blood pressures. Monitor daily weights, strict input and output.  Type 2 diabetes mellitus with diabetic chronic kidney disease (HCC) A1c 6.1. Will stop Accu-Cheks, insulin coverage. Hypoglycemia protocol  Essential hypertension Caution with antihypertensives as blood pressure borderline low.  CKD (chronic kidney disease), stage IIIa Renal function at baseline. Continue to monitor while on diuretics. Avoid other nephrotoxic drugs.  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 Holding Coumadin for now  Morbid obesity with BMI of 50.0-59.9, adult (HCC) Complicating factor to overall prognosis and care. Diet, exercise and weight reduction advised.  Chronic pulmonary embolism (HCC) Causing elevated pulmonary pressures Chronic DVT s/p IVC. Further management as outpatient. He would benefit from thrombectomy at a later time per heart failure team.     Out of bed to chair. Incentive spirometry. Nursing supportive care. Fall, aspiration precautions. DVT prophylaxis   Code Status: Full Code  Subjective: Patient is seen and examined today morning.  He is lying in bed, dyspneic while talking to me.  Patient is on 45 L supplemental oxygen.  Denies any chest pain or palpitations.  Physical Exam: Vitals:   09/21/23 1100 09/21/23 1200 09/21/23 1254 09/21/23 1300  BP: 110/66  (!) 120/54 109/66  Pulse: 87  91 89  Resp: (!) 22     Temp:  98.2 F (36.8 C)    TempSrc:  Axillary    SpO2: (!) 88%  90% 92%  Weight:      Height:        General - Middle aged morbidly obese African-American male, moderate respiratory distress HEENT - PERRLA, EOMI, atraumatic head, non tender sinuses. Lung -distant breath sounds, diffuse rales, rhonchi, no wheezes. Heart - S1, S2 heard, no murmurs, rubs, 1+ pitting pedal edema. Abdomen - Soft, non tender obese, bowel sounds good Neuro - Alert, awake and oriented x 3, non focal exam. Skin - Warm and dry.  Data Reviewed:      Latest Ref Rng & Units 09/21/2023    4:30 AM 09/20/2023    8:14 AM 09/19/2023   10:16 PM  CBC  WBC 4.0 -  10.5 K/uL 4.6   4.8   Hemoglobin 13.0 - 17.0 g/dL 09.8  11.9    14.7  82.9   Hematocrit 39.0 - 52.0 % 41.0  43.0    43.0  49.3   Platelets 150 - 400 K/uL 232   234       Latest Ref Rng & Units 09/21/2023    4:30 AM 09/20/2023    8:14 AM 09/20/2023   12:54 AM  BMP  Glucose 70 - 99 mg/dL 562   130   BUN 6 - 20  mg/dL 25   24   Creatinine 8.65 - 1.24 mg/dL 7.84   6.96   Sodium 295 - 145 mmol/L 137  141    142  137   Potassium 3.5 - 5.1 mmol/L 3.6  4.0    3.9  3.8   Chloride 98 - 111 mmol/L 103   102   CO2 22 - 32 mmol/L 25   25   Calcium 8.9 - 10.3 mg/dL 8.6   8.8    CARDIAC CATHETERIZATION Result Date: 09/21/2023 HEMODYNAMICS: RA:       16 mmHg (mean) RV:       103/9, 16 mmHg PA:       103/26 mmHg (55 mean) PCWP: 13 mmHg (mean)    Estimated Fick CO/CI   3.16L/min, 1.24L/min/m2 TPG  32  mmHg     PVR  10.41 Wood Units PAPi  4.8  IMPRESSION: Right heart catheterization for evaluation of pulmonary hypertension. Mildly elevated right sided filling pressures. 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 Severely reduced cardiac index by assumed Fick. RECOMMENDATIONS: Start milrinone and pulmonary vasodilators for decompensated PH. Start PA process for riociguat Plan for pulmonary angiogram  ECHOCARDIOGRAM COMPLETE Result Date: 09/21/2023    ECHOCARDIOGRAM REPORT   Patient Name:   Ryan [name] Date of Exam: 09/20/2023 Medical Rec #:  [id number]     Height:       67.0 in Accession #:    [id number]    Weight:       363.8 lb Date of Birth:  1971/05/13     BSA:          2.608 m Patient Age:    52 years      BP:           103/74 mmHg Patient Gender: M             HR:           76 bpm. Exam Location:  [facility] Procedure: 2D Echo, Cardiac Doppler and Color Doppler Indications:     Right Ventricular Failure  History:         Patient has prior history of Echocardiogram examinations, most                  recent 06/21/2023. CHF; Risk Factors:Hypertension and Diabetes.                  DVT. History of pulmonary embolus. Chronic kidney disease.  Sonographer:     Sedonia Small Rodgers-Jones RDCS Referring Phys:  [id number] Benay Spice [facility] Diagnosing Phys: Yvonne Kendall MD IMPRESSIONS  1. Left ventricular ejection fraction, by estimation, is 65 to 70%. The left ventricle has  normal function. The left ventricle has no regional wall motion abnormalities. Left ventricular diastolic parameters are consistent with Grade I diastolic dysfunction (impaired relaxation). There is the interventricular septum is flattened in systole, consistent with right  ventricular pressure overload.  2. Right ventricular systolic function is mildly reduced. The right ventricular size is moderately enlarged. Tricuspid regurgitation signal is inadequate for assessing PA pressure.  3. Right atrial size was mildly dilated.  4. The mitral valve is normal in structure. No evidence of mitral valve regurgitation.  5. The aortic valve has an indeterminant number of cusps. There is mild calcification of the aortic valve. There is mild thickening of the aortic valve. Aortic valve regurgitation is not visualized. Aortic valve sclerosis/calcification is present, without any evidence of aortic stenosis.  6. There is borderline dilatation of the ascending aorta, measuring 36 mm. FINDINGS  Left Ventricle: Left ventricular ejection fraction, by estimation, is 65 to 70%. The left ventricle has normal function. The left ventricle has no regional wall motion abnormalities. The left ventricular internal cavity size was normal in size. There is  borderline left ventricular hypertrophy. The interventricular septum is flattened in systole, consistent with right ventricular pressure overload. Left ventricular diastolic parameters are consistent with Grade I diastolic dysfunction (impaired relaxation). Right Ventricle: The right ventricular size is moderately enlarged. No increase in right ventricular wall thickness. Right ventricular systolic function is mildly reduced. Tricuspid regurgitation signal is inadequate for assessing PA pressure. Left Atrium: Left atrial size was normal in size. Right Atrium: Right atrial size was mildly dilated. Pericardium: There is no evidence of pericardial effusion. Mitral Valve: The mitral valve is  normal in structure. No evidence of mitral valve regurgitation. Tricuspid Valve: The tricuspid valve is normal in structure. Tricuspid valve regurgitation is trivial. Aortic Valve: The aortic valve has an indeterminant number of cusps. There is mild calcification of the aortic valve. There is mild thickening of the aortic valve. There is mild aortic valve annular calcification. Aortic valve regurgitation is not visualized. Aortic valve sclerosis/calcification is present, without any evidence of aortic stenosis. Aortic valve mean gradient measures 6.0 mmHg. Aortic valve peak gradient measures 10.5 mmHg. Aortic valve area, by VTI measures 3.53 cm. Pulmonic Valve: The pulmonic valve was normal in structure. Pulmonic valve regurgitation is not visualized. No evidence of pulmonic stenosis. Aorta: The aortic root is normal in size and structure. There is borderline dilatation of the ascending aorta, measuring 36 mm. Pulmonary Artery: The pulmonary artery is of normal size. IAS/Shunts: The interatrial septum was not well visualized.  LEFT VENTRICLE PLAX 2D LVIDd:         3.90 cm   Diastology LVIDs:         2.70 cm   LV e' medial:    6.09 cm/s LV PW:         1.10 cm   LV E/e' medial:  10.8 LV IVS:        1.10 cm   LV e' lateral:   13.55 cm/s LVOT diam:     2.20 cm   LV E/e' lateral: 4.9 LV SV:         88 LV SV Index:   34 LVOT Area:     3.80 cm  RIGHT VENTRICLE RV Basal diam:  6.10 cm RV S prime:     14.00 cm/s TAPSE (M-mode): 1.9 cm LEFT ATRIUM             Index        RIGHT ATRIUM           Index LA diam:        3.90 cm 1.50 cm/m   RA Area:     24.00 cm LA Vol (A2C):  74.7 ml 28.65 ml/m  RA Volume:   89.60 ml  34.36 ml/m LA Vol (A4C):   51.5 ml 19.75 ml/m LA Biplane Vol: 64.0 ml 24.54 ml/m  AORTIC VALVE AV Area (Vmax):    3.10 cm AV Area (Vmean):   2.80 cm AV Area (VTI):     3.53 cm AV Vmax:           162.02 cm/s AV Vmean:          116.683 cm/s AV VTI:            0.251 m AV Peak Grad:      10.5 mmHg AV Mean  Grad:      6.0 mmHg LVOT Vmax:         132.00 cm/s LVOT Vmean:        85.833 cm/s LVOT VTI:          0.233 m LVOT/AV VTI ratio: 0.93  AORTA Ao Root diam: 3.80 cm Ao Asc diam:  3.60 cm MITRAL VALVE MV Area (PHT): 2.81 cm    SHUNTS MV Decel Time: 270 msec    Systemic VTI:  0.23 m MV E velocity: 66.05 cm/s  Systemic Diam: 2.20 cm MV A velocity: 92.25 cm/s MV E/A ratio:  0.72 Cristal Deer End MD Electronically signed by Yvonne Kendall MD Signature Date/Time: 09/21/2023/7:31:44 AM    Final    [place] EKG SITE RITE Result Date: 09/20/2023 If Site Rite image not attached, placement could not be confirmed due to current cardiac rhythm.  [place] EKG SITE RITE Result Date: 09/20/2023 If [facility] image not attached, placement could not be confirmed due to current cardiac rhythm.  CT Angio Chest PE W/Cm &/Or Wo Cm Result Date: 09/20/2023 CLINICAL DATA:  53 year old male with shortness of breath, inspiratory sharp pain. History of acute and chronic pulmonary embolus. EXAM: CT ANGIOGRAPHY CHEST WITH CONTRAST TECHNIQUE: Multidetector CT imaging of the chest was performed using the standard protocol during bolus administration of intravenous contrast. Multiplanar CT image reconstructions and MIPs were obtained to evaluate the vascular anatomy. RADIATION DOSE REDUCTION: This exam was performed according to the departmental dose-optimization program which includes automated exposure control, adjustment of the mA and/or kV according to patient size and/or use of iterative reconstruction technique. CONTRAST:  OMNIPAQUE IOHEXOL 350 MG/ML SOLN COMPARISON:  CTA chest 08/18/2023 and earlier. FINDINGS: Cardiovascular: Good contrast bolus timing in the pulmonary arterial tree. Bulky, unresolved chronic PE in the right main pulmonary artery redemonstrated on series 6, image 108. This appears unchanged from last month and is decreased compared to 12/13/2022. No saddle embolus. Little residual left main pulmonary artery clot.  Comparatively small volume chronic PE in the left lower lobe has not significantly changed from the CTA last month or last April. Occasional small volume chronic PE also in the left upper lobe appears stable. Similar intermittent small volume chronic PE in the right lower lobe appears stable from last month, and seems mildly improved from last April. Chronic attenuation of right upper lobe pulmonary arteries is stable. No convincing acute pulmonary embolus. Stable cardiac size, borderline with no pericardial effusion. Contrast reflux from the right heart into the IVC and hepatic veins appears unchanged from last month. Little contrast in the aorta today. Mild Calcified aortic atherosclerosis. Prominent anterior chest wall and internal mammary venous collaterals are chronic and stable. Mediastinum/Nodes: Stable.  No mediastinal mass or lymphadenopathy. Lungs/Pleura: Slightly improved lung volumes from last month. Major airways remain patent. Asymmetric chronic left lung pulmonary artery enlargement  and tortuosity is noted. No pleural effusion or consolidation. Ventilation not significantly changed from last April. Upper Abdomen: The chronic chest wall venous collaterals continue along the abdominal wall, stable. IVC filter remains in place but patency is not evaluated with the contrast timing on this exam. Visible liver, gallbladder, spleen, pancreas, adrenal glands, and kidneys appear stable with only very early contrast opacification due to timing. Negative visible bowel. No free air or free fluid in the visible abdomen. Musculoskeletal: Some flowing endplate osteophytes in the thoracic spine again noted. Stable visualized osseous structures. No acute or suspicious osseous abnormality. Review of the MIP images confirms the above findings. IMPRESSION: 1. Stable Chronic PE, most pronounced in the right main pulmonary artery, and with chronically attenuated right upper lobe arterial branches. 2. Evidence of associated  chronic central and asymmetric left lung pulmonary artery hypertension. Stable ventilation with no acute pulmonary opacity or effusion. 3. Stable heart size with no pericardial effusion. Chronic venous collaterals in the chest and upper abdomen associated with abnormal distal IVC demonstrated on CT Abdomen and Pelvis in 2023. 4. No new abnormality identified on CTA Chest. Electronically Signed   By: Odessa Fleming M.D.   On: 09/20/2023 04:45   DG Chest 2 View Result Date: 09/19/2023 CLINICAL DATA:  Chest pain and shortness of breath. EXAM: CHEST - 2 VIEW COMPARISON:  CTA chest 08/18/2023.  PA Lat chest 08/18/2023. FINDINGS: The cardiac size is normal. There are mildly prominent central pulmonary arteries. No vascular congestion is seen. The mediastinum is normally outlined. The lungs are clear. There is bridging enthesopathy of the lower thoracic spine. IMPRESSION: No evidence of acute chest disease. Mildly prominent central pulmonary arteries. Stable chest. Electronically Signed   By: Almira Bar M.D.   On: 09/19/2023 22:31   Family Communication: Discussed with patient, he understand and agree. All questions answereed.  Disposition: Status is: Inpatient Remains inpatient appropriate because: IV diuretics, milrinone, heparin drip, supplemental oxygen  Planned Discharge Destination: Home with Home Health     MDM level 3-patient is short of breath, requiring 4 to 5 L supplemental O2.  Patient is on milrinone drip, IV diuretics and heparin drip.  Patient will need hemodynamic, electrolyte, telemetry close monitoring in the unit.  Patient is at high risk for sudden clinical deterioration.  Author: Marcelino Duster, MD 09/21/2023 1:34 PM Secure chat 7am to 7pm For on call review [email or web address].

## 2023-09-22 ENCOUNTER — Inpatient Hospital Stay: Payer: No Typology Code available for payment source | Admitting: Radiology

## 2023-09-22 DIAGNOSIS — I50812 Chronic right heart failure: Secondary | ICD-10-CM | POA: Diagnosis not present

## 2023-09-22 DIAGNOSIS — I5033 Acute on chronic diastolic (congestive) heart failure: Secondary | ICD-10-CM | POA: Diagnosis not present

## 2023-09-22 DIAGNOSIS — R0600 Dyspnea, unspecified: Secondary | ICD-10-CM | POA: Diagnosis not present

## 2023-09-22 DIAGNOSIS — I825Z3 Chronic embolism and thrombosis of unspecified deep veins of distal lower extremity, bilateral: Secondary | ICD-10-CM | POA: Diagnosis not present

## 2023-09-22 DIAGNOSIS — I10 Essential (primary) hypertension: Secondary | ICD-10-CM | POA: Diagnosis not present

## 2023-09-22 HISTORY — PX: IR FLUORO GUIDE CV LINE RIGHT: IMG2283

## 2023-09-22 LAB — GLUCOSE, CAPILLARY
Glucose-Capillary: 117 mg/dL — ABNORMAL HIGH (ref 70–99)
Glucose-Capillary: 93 mg/dL (ref 70–99)
Glucose-Capillary: 92 mg/dL (ref 70–99)
Glucose-Capillary: 110 mg/dL — ABNORMAL HIGH (ref 70–99)

## 2023-09-22 LAB — COOXEMETRY PANEL
Carboxyhemoglobin: 1 % (ref 0.5–1.5)
Carboxyhemoglobin: 2.1 % — ABNORMAL HIGH (ref 0.5–1.5)
Methemoglobin: <0.7 % (ref 0.0–1.5)
Methemoglobin: 1.2 % (ref 0.0–1.5)
O2 Saturation: 66.7 %
O2 Saturation: 81.8 %
Total hemoglobin: 14.3 g/dL (ref 12.0–16.0)
Total hemoglobin: 17.6 g/dL — ABNORMAL HIGH (ref 12.0–16.0)
Total oxygen content: 65.8 %
Total oxygen content: 79.1 %

## 2023-09-22 LAB — CBC
HCT: 40.2 % (ref 39.0–52.0)
HCT: 46.1 % (ref 39.0–52.0)
Hemoglobin: 13.5 g/dL (ref 13.0–17.0)
Hemoglobin: 14.9 g/dL (ref 13.0–17.0)
MCH: 30.5 pg (ref 26.0–34.0)
MCH: 30.4 pg (ref 26.0–34.0)
MCHC: 33.6 g/dL (ref 30.0–36.0)
MCHC: 32.3 g/dL (ref 30.0–36.0)
MCV: 91 fL (ref 80.0–100.0)
MCV: 94.1 fL (ref 80.0–100.0)
Platelets: 229 10*3/uL (ref 150–400)
Platelets: 233 10*3/uL (ref 150–400)
RBC: 4.42 MIL/uL (ref 4.22–5.81)
RBC: 4.9 MIL/uL (ref 4.22–5.81)
RDW: 14.5 % (ref 11.5–15.5)
RDW: 14.6 % (ref 11.5–15.5)
WBC: 5.7 10*3/uL (ref 4.0–10.5)
WBC: 5 10*3/uL (ref 4.0–10.5)
nRBC: 0 % (ref 0.0–0.2)
nRBC: 0 % (ref 0.0–0.2)

## 2023-09-22 LAB — COMPREHENSIVE METABOLIC PANEL
ALT: 19 U/L (ref 0–44)
AST: 18 U/L (ref 15–41)
Albumin: 3.9 g/dL (ref 3.5–5.0)
Alkaline Phosphatase: 54 U/L (ref 38–126)
Anion gap: 14 (ref 5–15)
BUN: 22 mg/dL — ABNORMAL HIGH (ref 6–20)
CO2: 23 mmol/L (ref 22–32)
Calcium: 9.1 mg/dL (ref 8.9–10.3)
Chloride: 97 mmol/L — ABNORMAL LOW (ref 98–111)
Creatinine, Ser: 1.39 mg/dL — ABNORMAL HIGH (ref 0.61–1.24)
GFR, Estimated: >60 mL/min (ref >60)
Glucose, Bld: 183 mg/dL — ABNORMAL HIGH (ref 70–99)
Potassium: 4.2 mmol/L (ref 3.5–5.1)
Sodium: 134 mmol/L — ABNORMAL LOW (ref 135–145)
Total Bilirubin: 1.1 mg/dL (ref 0.0–1.2)
Total Protein: 8.2 g/dL — ABNORMAL HIGH (ref 6.5–8.1)

## 2023-09-22 LAB — HEPARIN LEVEL (UNFRACTIONATED): Heparin Unfractionated: 0.43 [IU]/mL (ref 0.30–0.70)

## 2023-09-22 LAB — BASIC METABOLIC PANEL
Anion gap: 10 (ref 5–15)
BUN: 23 mg/dL — ABNORMAL HIGH (ref 6–20)
CO2: 21 mmol/L — ABNORMAL LOW (ref 22–32)
Calcium: 8.4 mg/dL — ABNORMAL LOW (ref 8.9–10.3)
Chloride: 104 mmol/L (ref 98–111)
Creatinine, Ser: 1.44 mg/dL — ABNORMAL HIGH (ref 0.61–1.24)
GFR, Estimated: 58 mL/min — ABNORMAL LOW (ref >60)
Glucose, Bld: 110 mg/dL — ABNORMAL HIGH (ref 70–99)
Potassium: 3.7 mmol/L (ref 3.5–5.1)
Sodium: 135 mmol/L (ref 135–145)

## 2023-09-22 LAB — PROTIME-INR
INR: 1.7 — ABNORMAL HIGH (ref 0.8–1.2)
Prothrombin Time: 20.4 s — ABNORMAL HIGH (ref 11.4–15.2)

## 2023-09-22 LAB — MAGNESIUM: Magnesium: 1.9 mg/dL (ref 1.7–2.4)

## 2023-09-22 LAB — DIGOXIN LEVEL: Digoxin Level: 0.3 ng/mL — ABNORMAL LOW (ref 0.8–2.0)

## 2023-09-22 LAB — LIPOPROTEIN A (LPA): Lipoprotein (a): <8.4 nmol/L (ref <75.0)

## 2023-09-22 MED ORDER — LIDOCAINE HCL 1 % IJ SOLN
INTRAMUSCULAR | Status: AC
  Filled 2023-09-22: qty 20

## 2023-09-22 MED ORDER — POTASSIUM CHLORIDE CRYS ER 20 MEQ PO TBCR
40.0000 meq | EXTENDED_RELEASE_TABLET | Freq: Once | ORAL | Status: AC
Start: 2023-09-22 — End: 2023-09-22
  Administered 2023-09-22: 40 meq via ORAL
  Filled 2023-09-22: qty 2

## 2023-09-22 MED ORDER — MAGNESIUM SULFATE 2 GM/50ML IV SOLN
2.0000 g | Freq: Once | INTRAVENOUS | Status: AC
  Administered 2023-09-22: 2 g via INTRAVENOUS
  Filled 2023-09-22: qty 50

## 2023-09-22 MED ORDER — POTASSIUM CHLORIDE CRYS ER 20 MEQ PO TBCR
40.0000 meq | EXTENDED_RELEASE_TABLET | Freq: Once | ORAL | Status: AC
  Administered 2023-09-22: 40 meq via ORAL
  Filled 2023-09-22: qty 2

## 2023-09-22 MED ORDER — LIDOCAINE HCL 1 % IJ SOLN
6.0000 mL | Freq: Once | INTRAMUSCULAR | Status: AC
  Administered 2023-09-22: 6 mL via INTRADERMAL

## 2023-09-22 MED ORDER — POTASSIUM CHLORIDE CRYS ER 20 MEQ PO TBCR
40.0000 meq | EXTENDED_RELEASE_TABLET | Freq: Once | ORAL | Status: DC
  Filled 2023-09-22: qty 2

## 2023-09-22 MED ORDER — WARFARIN SODIUM 7.5 MG PO TABS
7.5000 mg | ORAL_TABLET | Freq: Once | ORAL | Status: AC
  Administered 2023-09-22: 7.5 mg via ORAL
  Filled 2023-09-22: qty 1

## 2023-09-22 MED ORDER — DEXTROSE 5 % IV SOLN
160.0000 mg | Freq: Two times a day (BID) | INTRAVENOUS | Status: DC
  Administered 2023-09-22 – 2023-09-25 (×7): 160 mg via INTRAVENOUS
  Filled 2023-09-22 (×7): qty 16
  Filled 2023-09-22: qty 14

## 2023-09-22 NOTE — Progress Notes (Signed)
Physical Therapy Treatment Patient Details Name: Ryan [name] MRN: [id number] DOB: Jan 23, 1971 Today's Date: 09/22/2023   History of Present Illness 53 y.o. male  who presented to the ED with a 2-day history of shortness of breath and sharp chest pain with deep inspiration. R cardiac cath yesterday 1/21. 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    PT Comments  Patient received in bed, he is pleasant and agrees to PT. Patient is independent with bed mobility and transfers. Assist for lines only. Ambulated 125 feet without AD and on room air. O2 sats down to 89% after walking but quickly rose back to 90%s with rest. He will continue to benefit from skilled PT to improve strength and endurance.     If plan is discharge home, recommend the following: Assist for transportation;Help with stairs or ramp for entrance   Can travel by private vehicle      yes  Equipment Recommendations  None recommended by PT    Recommendations for Other Services       Precautions / Restrictions Precautions Precautions: Fall Restrictions Weight Bearing Restrictions Per Provider Order: No     Mobility  Bed Mobility Overal bed mobility: Modified Independent                  Transfers Overall transfer level: Independent Equipment used: None                    Ambulation/Gait Ambulation/Gait assistance: Supervision Gait Distance (Feet): 125 Feet Assistive device: None Gait Pattern/deviations: Step-through pattern, Decreased step length - right, Decreased step length - left, Decreased stride length, Wide base of support Gait velocity: mildly decreased     General Gait Details: Ambulating well without AD, no LOB, mild SOB O2 sats at 89% on room air.   Stairs             Wheelchair Mobility     Tilt Bed    Modified Rankin (Stroke Patients  Only)       Balance Overall balance assessment: No apparent balance deficits (not formally assessed)                                          Cognition Arousal: Alert Behavior During Therapy: WFL for tasks assessed/performed Overall Cognitive Status: Within Functional Limits for tasks assessed                                 General Comments: patient very pleasant and cooperative        Exercises      General Comments        Pertinent Vitals/Pain Pain Assessment Pain Assessment: No/denies pain    Home Living                          Prior Function            PT Goals (current goals can now be found in the care plan section) Acute Rehab PT Goals Patient Stated Goal: to improve, return home PT Goal Formulation: With patient Time For Goal Achievement: 09/30/23 Potential to Achieve Goals: Good Progress towards PT goals: Progressing toward goals  Frequency    Min 1X/week      PT Plan      Co-evaluation              AM-PAC PT "6 Clicks" Mobility   Outcome Measure  Help needed turning from your back to your side while in a flat bed without using bedrails?: None Help needed moving from lying on your back to sitting on the side of a flat bed without using bedrails?: None Help needed moving to and from a bed to a chair (including a wheelchair)?: None Help needed standing up from a chair using your arms (e.g., wheelchair or bedside chair)?: None Help needed to walk in hospital room?: A Little Help needed climbing 3-5 steps with a railing? : A Little 6 Click Score: 22    End of Session Equipment Utilized During Treatment: Oxygen Activity Tolerance: Patient tolerated treatment well Patient left: in bed;with call bell/phone within reach Nurse Communication: Mobility status PT Visit Diagnosis: Muscle weakness (generalized) (M62.81);Difficulty in walking, not elsewhere classified (R26.2)     Time:  5188-4166 PT Time Calculation (min) (ACUTE ONLY): 12 min  Charges:    $Gait Training: 8-22 mins PT General Charges $$ ACUTE PT VISIT: 1 Visit                     Breona Cherubin, PT, GCS 09/22/23,1:55 PM

## 2023-09-22 NOTE — Progress Notes (Addendum)
Advanced Heart Failure Rounding Note  HF Cardiologist: Dr. 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  No complaints. Feeling better each day. Dyspnea improved.    Objective:   Weight Range: (!) 161 kg Body mass index is 55.59 kg/m.   Vital Signs:   Temp:  [97.4 F (36.3 C)-98.4 F (36.9 C)] 98.4 F (36.9 C) (01/23 0400) Pulse Rate:  [81-106] 92 (01/23 0600) Resp:  [16-23] 20 (01/23 0100) BP: (98-133)/(53-87) 119/80 (01/23 0600) SpO2:  [88 %-96 %] 93 % (01/23 0600) Weight:  [161 kg] 161 kg (01/23 0342) Last BM Date : 09/21/23  Weight change: Filed Weights   09/20/23 1755 09/21/23 0419 09/22/23 0342  Weight: (!) 165 kg (!) 165 kg (!) 161 kg    Intake/Output:   Intake/Output Summary (Last 24 hours) at 09/22/2023 0711 Last data filed at 09/22/2023 0602 Gross per 24 hour  Intake 2047.04 ml  Output 1750 ml  Net 297.04 ml      Physical Exam    General:  Well appearing. Sitting up in bed. HEENT: normal Neck: supple. JVP difficult d/t thick neck Cor: PMI nondisplaced. Regular rate & rhythm. No rubs, gallops or murmurs. Lungs: clear Abdomen: obese, soft, nontender, nondistended.  Extremities: no cyanosis, clubbing, rash, 2+ edema, chronic venous stasis changes. RUE PICC Neuro: alert & orientedx3. Affect pleasant  Telemetry   NSR  Labs    CBC Recent Labs    09/19/23 2216 09/20/23 0814 09/21/23 0430 09/22/23 0413  WBC 4.8  --  4.6 5.7  NEUTROABS 1.8  --   --   --   HGB 15.1   < > 13.7 13.5  HCT 49.3   < > 41.0 40.2  MCV 99.8  --  93.0 91.0  PLT 234  --  232 229   < > = values in this interval not displayed.   Basic Metabolic Panel Recent Labs    16/10/96 0430 09/22/23 0413  NA 137 135  K 3.6 3.7  CL 103 104  CO2 25 21*  GLUCOSE 114* 110*  BUN 25* 23*  CREATININE 1.50* 1.44*  CALCIUM 8.6* 8.4*  MG 1.9 1.9   Liver Function  Tests Recent Labs    09/20/23 0054  AST 22  ALT 23  ALKPHOS 50  BILITOT 0.9  PROT 7.5  ALBUMIN 3.6   No results for input(s): "LIPASE", "AMYLASE" in the last 72 hours. Cardiac Enzymes No results for input(s): "CKTOTAL", "CKMB", "CKMBINDEX", "TROPONINI" in the last 72 hours.  BNP: BNP (last 3 results) Recent Labs    07/19/23 0310 08/19/23 0213 09/19/23 2216  BNP 298.0* 267.8* 280.4*    ProBNP (last 3 results) No results for input(s): "PROBNP" in the last 8760 hours.   D-Dimer No results for input(s): "DDIMER" in the last 72 hours. Hemoglobin A1C No results for input(s): "HGBA1C" in the last 72 hours. Fasting Lipid Panel No results for input(s): "CHOL", "HDL", "LDLCALC", "TRIG", "CHOLHDL", "LDLDIRECT" in the last 72 hours. Thyroid Function Tests No results for input(s): "TSH", "T4TOTAL", "T3FREE", "THYROIDAB" in the last 72 hours.  Invalid input(s): "FREET3"  Other results:   Imaging    No results found.    Medications:     Scheduled Medications:  aspirin EC  81 mg Oral Daily   Chlorhexidine Gluconate Cloth  6 each Topical Daily   digoxin  0.0625 mg Oral Daily  furosemide  80 mg Intravenous BID   sildenafil  20 mg Oral TID   sodium chloride flush  10-40 mL Intracatheter Q12H   spironolactone  25 mg Oral Daily   Warfarin - Pharmacist Dosing Inpatient   Does not apply q1600    Infusions:  heparin 1,750 Units/hr (09/22/23 0602)   milrinone 0.25 mcg/kg/min (09/22/23 0602)    PRN Medications: acetaminophen, albuterol, nitroGLYCERIN, ondansetron (ZOFRAN) IV, mouth rinse, sodium chloride flush    Patient Profile   53 y.o. 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 NYHA IV. 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 Thrombectomy would be very high risk with his weight and current decompensation.  -Continues on 0.25 milrinone with estimated Fick CI of 2.6. May need home milrinone. Will reach out to [employer] home infusion rep, Jeri Modena, RN. -Continue to push diuresis, increase IV lasix to 160 mg BID. Supp K and Mag aggressively. -Started sildenafil 20 mg TID. Will not titrate further as he will eventually transition to riociguat.  -Stopped beta blocker with low-output -Continue spiro 25 mg daily -Off losartan with soft BP -Continue digoxin 0.0625 mg daily, dig level 0.3 on 01/23 -Known OSA. Reports he is waiting to receive CPAP.   2. 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 The lower filter appeared occluded on prior angiography with multiple lower venous collaterals. Vascular surgery previously felt he was not a candidate for surgery. Suspect this has contributed to his lower extremity edema, chronic PE and "faliure" of OAC. Difficult situation, event if CTEPH treated he would be at high risk for future clots and ongoing lower extremity swelling. -Chronic, stable PE on CTA this admit.  -On heparin gtt.  -Resumed Warfarin. INR recently supratherapeutic. 1.7 today   3. Atrial fibrillation/flutter -ECG on presentation with sinus tach -Stopped beta blocker as above -Heparin gtt   4. CKD IIIa -Baseline Scr seems to be 1.4-1.7 -Scr stable at 1.4 today -Support CO with milrinone    Continue to mobilize as tolerated.   Anticipate he will be hospitalized through the weekend with possible discharge early next  week.   If he goes home with milrinone, will eventually need to exchange current PICC for tunneled single lumen PICC with IR.  Length of Stay: 2  Aibhlinn Kalmar N, PA-C  09/22/2023, 7:11 AM  Advanced Heart Failure Team Pager [phone] (M-F; 7a - 5p)  Please contact [facility] for night-coverage after hours (5p -7a ) and weekends on [email or web address]

## 2023-09-22 NOTE — Progress Notes (Addendum)
Patient called this RN into the room and stated he is feeling more tired, a bit nauseous, and weaker. He said it came on suddenly. He felt hot and had chills. No fever noted. Increased oxygen to 5 L. No c/o SOB or chest pain. VSS. Pt denies need for nausea medication. Dr. Georgeann Oppenheim notified.

## 2023-09-22 NOTE — Progress Notes (Signed)
ANTICOAGULATION CONSULT NOTE  Pharmacy Consult for heparin infusion and resume PTA warfarin  Indication: CTEPH / recurrent DVT / PE  Patient Measurements: Height: 5\' 7"  (170.2 cm) Weight: (!) 161 kg (354 lb 15.1 oz) IBW/kg (Calculated) : 66.1 Heparin Dosing Weight: 104.8 kg  Labs: Recent Labs    09/19/23 2216 09/20/23 0054 09/20/23 0814 09/20/23 1430 09/21/23 0430 09/21/23 1121 09/22/23 0413  HGB 15.1  --  14.6  14.6  --  13.7  --  13.5  HCT 49.3  --  43.0  43.0  --  41.0  --  40.2  PLT 234  --   --   --  232  --  229  LABPROT 16.7*  --   --   --  18.6*  --  20.4*  INR 1.3*  --   --   --  1.5*  --  1.7*  HEPARINUNFRC  --   --   --    < > 0.47 0.41 0.43  CREATININE  --  1.55*  --   --  1.50*  --  1.44*  TROPONINIHS 68* 86*  --   --   --   --   --    < > = values in this interval not displayed.    Estimated Creatinine Clearance: 87.4 mL/min (A) (by C-G formula based on SCr of 1.44 mg/dL (H)).   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  Regular the workup has not been forthcoming) previously followed by Dr. Isaiah Serge at [facility]   Hepatic steatosis    Hypertension    Morbid obesity with BMI of 50.0-59.9, adult (HCC)    Oxygen deficiency    Peripheral vascular disease (HCC)    Presence of IVC filter    Has 2 IVC filters with significant thrombus burden superior to the filter.   Pulmonary embolus (HCC) 12/2016   Initially just treated with anticoagulation, but in February 2023 in April 2024 treated with EKOS thrombectomy for submassive PE.   Ventricular trigeminy    Weakness of  both lower limbs     Assessment: Pt is a 53 yo male w/ hx of DVT/PE on warfarin (last dose 1/19 @ 2345) presenting to ED c/o SOB x 1.5 days found with elevated troponin I level trending up.  Pt has/had cardiac cath procedure scheduled for today at 0730. Hx: multiple DVTs/PES dating back to 76s. Per chart review, has MTHFR gene mutation and was previously followed by Hematology (Dr. Isaiah Serge)   Last note from anticoagulation clinic on 09/15/23 noted INR goal 2.5-3 given recurrent thromboembolic events with INR < 2. Patient with supratherapeutic INRs on 1/16 and 1/17 so dose was decreased from 7.5 mg daily to 5 mg daily and patient was instructed to hold warfarin until 1/19.  HL: 1/21 1430 HL= 0.10  SUBtherapeutic (drawn ~ 4 hours after heparin drip restart from procedure) 1/21 2244 HL 0.21, subtherapeutic 1/22 0430 HL 0.47, therapeutic x 1 1/22 1121 HL 0.41, therapeutic x 2 1/23 0413 HL 0.43, therapeutic x 3  INR: 1/20 INR 1.3 (per  med rec pt took 7.5 mg dose PTA) 1/21 no INR  resume warfarin 7.5mg  x 1 , check INR in am 1/22 INR 1.5 1/23 INR 1.7  Goal of Therapy:  Heparin level 0.3-0.7 units/ml INR 2.5 - 3 Monitor platelets by anticoagulation protocol: Yes   Plan:  Heparin: --Continue heparin infusion at 1750 units/hr --Will recheck HL tomorrow AM --Will bridge with heparin drip until INR therapeutic with warfarin --CBC daily while on heparin  Warfarin: --Warfarin level is LOW and suspect this is due to held doses (likely 1/16 - 1/18) given supratherapeutic INRs in outpatient setting --Will give warfarin 7.5 mg PO x 1 dose tonight --INR daily and CBC per protocol  Tressie Ellis 09/22/2023 8:07 AM

## 2023-09-22 NOTE — Plan of Care (Signed)
  Problem: Education: Goal: Ability to describe self-care measures that may prevent or decrease complications (Diabetes Survival Skills Education) will improve Outcome: Progressing   Problem: Coping: Goal: Ability to adjust to condition or change in health will improve Outcome: Progressing   Problem: Fluid Volume: Goal: Ability to maintain a balanced intake and output will improve Outcome: Progressing   Problem: Metabolic: Goal: Ability to maintain appropriate glucose levels will improve Outcome: Progressing   Problem: Skin Integrity: Goal: Risk for impaired skin integrity will decrease Outcome: Progressing   Problem: Tissue Perfusion: Goal: Adequacy of tissue perfusion will improve Outcome: Progressing   

## 2023-09-22 NOTE — Consult Note (Signed)
Hospital Consult    Reason for Consult:  Shortness of Breath with HX of Chronic Pulmonary Embolism Requesting Physician:  Dr Tresa Res MD  MRN #:  [id number]  History of Present Illness: This is a 53 y.o. 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   Upon workup patient is noted to to have undergone a CTA of the chest which shows chronic pulmonary embolisms.  Patient has had 3 prior pulmonary thrombectomies dating back to 08/04/2017, 12/13/2022, and 05/17/2023.  Throughout this time the patient's been on continued anticoagulation which she admits to not being compliant with.  He originally had difficulty with Coumadin and adjusting his doses so he was changed to Eliquis then Xarelto and now back on Coumadin as he was deemed during his last pulmonary embolism and pulmonary thrombectomy to be an Eliquis failure.  Vascular surgery consulted to evaluate.    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  Regular the workup has not been forthcoming) previously followed by Dr. Isaiah Serge at [facility]   Hepatic steatosis    Hypertension    Morbid obesity with BMI of 50.0-59.9, adult (HCC)    Oxygen deficiency    Peripheral vascular disease (HCC)    Presence of IVC filter    Has 2 IVC filters with  significant thrombus burden superior to the filter.   Pulmonary embolus (HCC) 12/2016   Initially just treated with anticoagulation, but in February 2023 in April 2024 treated with EKOS thrombectomy for submassive PE.   Ventricular trigeminy    Weakness of both lower limbs     Past Surgical History:  Procedure Laterality Date   CARDIAC ELECTROPHYSIOLOGY STUDY AND ABLATION  10/2020   CARDIOVERSION N/A 06/21/2023   Procedure: CARDIOVERSION;  Surgeon: Dorthula Nettles, DO;  Location: [facility] ORS;  Service: Cardiovascular;  Laterality: N/A;   PERIPHERAL VASCULAR CATHETERIZATION N/A 01/10/2015   Procedure: Dialysis/Perma Catheter Insertion;  Surgeon: Renford Dills, MD;  Location: [facility] INVASIVE CV LAB;  Service: Cardiovascular;  Laterality: N/A;   PERIPHERAL VASCULAR CATHETERIZATION N/A 02/24/2015   Procedure: Dialysis/Perma Catheter Removal;  Surgeon: Annice Needy, MD;  Location: [facility] INVASIVE CV LAB;  Service: Cardiovascular;  Laterality: N/A;   PULMONARY THROMBECTOMY N/A 12/13/2022   Procedure: PULMONARY THROMBECTOMY;  Surgeon: Annice Needy, MD;  Location: [facility] INVASIVE CV LAB;  Service: Cardiovascular;  Laterality: N/A;   PULMONARY THROMBECTOMY N/A 05/17/2023   Procedure: PULMONARY THROMBECTOMY;  Surgeon: Renford Dills, MD;  Location: [facility] INVASIVE CV LAB;  Service: Cardiovascular;  Laterality: N/A;   RIGHT HEART CATH Right 09/20/2023   Procedure: RIGHT HEART CATH;  Surgeon: [name], MD;  Location: [facility] INVASIVE CV LAB;  Service: Cardiovascular;  Laterality: Right;   TEE WITHOUT CARDIOVERSION N/A 06/21/2023   Procedure:  TRANSESOPHAGEAL ECHOCARDIOGRAM (TEE);  Surgeon: Dorthula Nettles, DO;  Location: [facility] ORS;  Service: Cardiovascular;  Laterality: N/A;    Allergies  Allergen Reactions   Jardiance [Empagliflozin]     Caused infection    Metrizamide Other (See Comments)    Kidney failure requiring dialysis    Clindamycin/Lincomycin Rash   Lincomycin Rash   Sulfa  Antibiotics Rash    Prior to Admission medications   Medication Sig Start Date End Date Taking? Authorizing Provider  albuterol (VENTOLIN HFA) 108 (90 Base) MCG/ACT inhaler Inhale 2 puffs into the lungs every 6 (six) hours as needed for wheezing or shortness of breath. 02/23/22  Yes Sunnie Nielsen, DO  Digoxin 62.5 MCG TABS Take 0.0625 mg by mouth daily. 09/15/23  Yes Sherlene Shams, MD  losartan (COZAAR) 25 MG tablet Take 1 tablet (25 mg total) by mouth daily. 06/24/23  Yes Loyce Dys, MD  metoprolol succinate (TOPROL-XL) 50 MG 24 hr tablet Take 1 tablet (50 mg total) by mouth daily. Take with or immediately following a meal. 06/24/23  Yes Djan, Scarlette Calico, MD  Multiple Vitamin (MULTIVITAMIN WITH MINERALS) TABS tablet Take 1 tablet by mouth daily. 06/24/23  Yes Loyce Dys, MD  OXYGEN Inhale 1 L into the lungs daily.   Yes [provider]  spironolactone (ALDACTONE) 25 MG tablet Take 1 tablet (25 mg total) by mouth daily. 06/24/23  Yes Loyce Dys, MD  tirzepatide [facility]) 7.5 MG/0.5ML Pen Inject 7.5 mg into the skin once a week. 08/18/23  Yes Sherlene Shams, MD  torsemide (DEMADEX) 20 MG tablet Take 1 tablet (20 mg total) by mouth daily. 08/24/23  Yes Lurene Shadow, MD  warfarin (COUMADIN) 5 MG tablet Take 1.5 tablets (7.5 mg total) by mouth daily. 08/22/23  Yes Lurene Shadow, MD  ondansetron (ZOFRAN) 4 MG tablet Take 1 tablet (4 mg total) by mouth every 8 (eight) hours as needed for nausea or vomiting. Patient not taking: Reported on 09/14/2023 08/18/23   Sherlene Shams, MD  Riociguat (ADEMPAS) 1 MG TABS Take 1 tablet by mouth 3 (three) times daily. 09/21/23   Marygrace Drought, RPH-CPP    Social History   Socioeconomic History   Marital status: Married    Spouse name: Not on file   Number of children: Not on file   Years of education: Not on file   Highest education level: Some college, no degree  Occupational History   Not on file  Tobacco Use   Smoking status:  Never   Smokeless tobacco: Never  Vaping Use   Vaping status: Never Used  Substance and Sexual Activity   Alcohol use: Never   Drug use: Never   Sexual activity: Yes    Partners: Female  Other Topics Concern   Not on file  Social History Narrative   Not on file   Social Drivers of Health   Financial Resource Strain: Low Risk  (08/18/2023)   Overall Financial Resource Strain (CARDIA)    Difficulty of Paying Living Expenses: Not hard at all  Food Insecurity: Patient Declined (09/20/2023)   Hunger Vital Sign    Worried About Running Out of Food in the Last Year: Patient declined    Ran Out of Food in the Last Year: Patient declined  Transportation Needs: Patient Declined (09/20/2023)   PRAPARE - Administrator, Civil Service (Medical): Patient declined    Lack of Transportation (Non-Medical): Patient declined  Physical Activity: Insufficiently Active (08/18/2023)   Exercise  Vital Sign    Days of Exercise per Week: 3 days    Minutes of Exercise per Session: 30 min  Stress: No Stress Concern Present (08/18/2023)   [employer] of Occupational Health - Occupational Stress Questionnaire    Feeling of Stress : Only a little  Social Connections: Moderately Integrated (08/18/2023)   Social Connection and Isolation Panel [NHANES]    Frequency of Communication with Friends and Family: More than three times a week    Frequency of Social Gatherings with Friends and Family: Once a week    Attends Religious Services: More than 4 times per year    Active Member of [employer] or Organizations: Yes    Attends Banker Meetings: More than 4 times per year    Marital Status: Separated  Intimate Partner Violence: Unknown (09/20/2023)   Humiliation, Afraid, Rape, and Kick questionnaire    Fear of Current or Ex-Partner: Patient declined    Emotionally Abused: Patient declined    Physically Abused: No    Sexually Abused: Patient declined     Family History  Problem  Relation Age of Onset   Hypertension Mother    Cancer Mother    Breast cancer Mother    Prostate cancer Father    Hypertension Father    Diabetes Father    Heart disease Maternal Grandmother    Pulmonary embolism Paternal Grandfather    Pulmonary embolism Paternal Uncle    Pulmonary embolism Cousin    Deep vein thrombosis Cousin    Pulmonary embolism Paternal Aunt     ROS: Otherwise negative unless mentioned in HPI  Physical Examination  Vitals:   09/22/23 1351 09/22/23 1600  BP:    Pulse:    Resp:    Temp:  (!) 97.4 F (36.3 C)  SpO2: 91%    Body mass index is 55.59 kg/m.  General:  WDWN in NAD Gait: Not observed HENT: WNL, normocephalic Pulmonary: normal non-labored breathing, without Rales, rhonchi,  wheezing Cardiac: irregular, without  Murmurs, rubs or gallops; without carotid bruits Abdomen: Positive bowel sounds throughout, soft, NT/ND, no masses Skin: without rashes Vascular Exam/Pulses: Bilateral upper and lower extremity doppler pulses only. +3 edema to bilateral lower extremities. Positive morbid obesity.  Extremities: without ischemic changes, without Gangrene , without cellulitis; without open wounds;  Musculoskeletal: no muscle wasting or atrophy  Neurologic: A&O X 3;  No focal weakness or paresthesias are detected; speech is fluent/normal Psychiatric:  The pt has Normal affect. Lymph:  Unremarkable  CBC    Component Value Date/Time   WBC 5.7 09/22/2023 0413   RBC 4.42 09/22/2023 0413   HGB 13.5 09/22/2023 0413   HGB 14.5 07/04/2023 0850   HCT 40.2 09/22/2023 0413   HCT 45.2 07/04/2023 0850   PLT 229 09/22/2023 0413   PLT 300 07/04/2023 0850   MCV 91.0 09/22/2023 0413   MCV 94 07/04/2023 0850   MCV 90 12/28/2014 0444   MCH 30.5 09/22/2023 0413   MCHC 33.6 09/22/2023 0413   RDW 14.5 09/22/2023 0413   RDW 14.4 07/04/2023 0850   RDW 14.0 12/28/2014 0444   LYMPHSABS 2.3 09/19/2023 2216   LYMPHSABS 1.6 07/04/2023 0850   LYMPHSABS 1.3  12/28/2014 0444   MONOABS 0.6 09/19/2023 2216   MONOABS 2.1 (H) 12/28/2014 0444   EOSABS 0.1 09/19/2023 2216   EOSABS 0.1 07/04/2023 0850   EOSABS 0.1 12/28/2014 0444   BASOSABS 0.0 09/19/2023 2216   BASOSABS 0.0 07/04/2023 0850   BASOSABS 0.1 12/28/2014 0444  BMET    Component Value Date/Time   NA 135 09/22/2023 0413   NA 139 08/26/2023 1445   NA 130 (L) 12/28/2014 1602   K 3.7 09/22/2023 0413   K 3.8 12/28/2014 1602   CL 104 09/22/2023 0413   CL 94 (L) 12/28/2014 1602   CO2 21 (L) 09/22/2023 0413   CO2 24 12/28/2014 1602   GLUCOSE 110 (H) 09/22/2023 0413   GLUCOSE 121 (H) 12/28/2014 1602   BUN 23 (H) 09/22/2023 0413   BUN 27 (H) 08/26/2023 1445   BUN 57 (H) 12/28/2014 1602   CREATININE 1.44 (H) 09/22/2023 0413   CREATININE 5.55 (H) 12/28/2014 1602   CALCIUM 8.4 (L) 09/22/2023 0413   CALCIUM 8.1 (L) 12/28/2014 1602   GFRNONAA 58 (L) 09/22/2023 0413   GFRNONAA 11 (L) 12/28/2014 1602   GFRAA 55 (L) 03/07/2020 2009   GFRAA 13 (L) 12/28/2014 1602    COAGS: Lab Results  Component Value Date   INR 1.7 (H) 09/22/2023   INR 1.5 (H) 09/21/2023   INR 1.3 (H) 09/19/2023     Non-Invasive Vascular Imaging:   EXAM:09/20/2023 CT ANGIOGRAPHY CHEST WITH CONTRAST   TECHNIQUE: Multidetector CT imaging of the chest was performed using the standard protocol during bolus administration of intravenous contrast. Multiplanar CT image reconstructions and MIPs were obtained to evaluate the vascular anatomy.   RADIATION DOSE REDUCTION: This exam was performed according to the departmental dose-optimization program which includes automated exposure control, adjustment of the mA and/or kV according to patient size and/or use of iterative reconstruction technique.   CONTRAST:  OMNIPAQUE IOHEXOL 350 MG/ML SOLN   COMPARISON:  CTA chest 08/18/2023 and earlier.   FINDINGS: Cardiovascular: Good contrast bolus timing in the pulmonary arterial tree. Bulky, unresolved chronic PE  in the right main pulmonary artery redemonstrated on series 6, image 108. This appears unchanged from last month and is decreased compared to 12/13/2022.   No saddle embolus.   Little residual left main pulmonary artery clot.   Comparatively small volume chronic PE in the left lower lobe has not significantly changed from the CTA last month or last April. Occasional small volume chronic PE also in the left upper lobe appears stable.   Similar intermittent small volume chronic PE in the right lower lobe appears stable from last month, and seems mildly improved from last April.   Chronic attenuation of right upper lobe pulmonary arteries is stable.   No convincing acute pulmonary embolus.   Stable cardiac size, borderline with no pericardial effusion. Contrast reflux from the right heart into the IVC and hepatic veins appears unchanged from last month.   Little contrast in the aorta today. Mild Calcified aortic atherosclerosis.   Prominent anterior chest wall and internal mammary venous collaterals are chronic and stable.   Mediastinum/Nodes: Stable.  No mediastinal mass or lymphadenopathy.   Lungs/Pleura: Slightly improved lung volumes from last month. Major airways remain patent. Asymmetric chronic left lung pulmonary artery enlargement and tortuosity is noted. No pleural effusion or consolidation. Ventilation not significantly changed from last April.   Upper Abdomen: The chronic chest wall venous collaterals continue along the abdominal wall, stable. IVC filter remains in place but patency is not evaluated with the contrast timing on this exam.   Visible liver, gallbladder, spleen, pancreas, adrenal glands, and kidneys appear stable with only very early contrast opacification due to timing. Negative visible bowel. No free air or free fluid in the visible abdomen.   Musculoskeletal: Some flowing endplate osteophytes  in the thoracic spine again noted. Stable  visualized osseous structures. No acute or suspicious osseous abnormality.   Review of the MIP images confirms the above findings.   IMPRESSION: 1. Stable Chronic PE, most pronounced in the right main pulmonary artery, and with chronically attenuated right upper lobe arterial branches. 2. 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 Stable ventilation with no acute pulmonary opacity or effusion. 3. Stable heart size with no pericardial effusion. Chronic venous collaterals in the chest and upper abdomen associated with abnormal distal IVC demonstrated on CT Abdomen and Pelvis in 2023. 4. No new abnormality identified on CTA Chest.  Statin:  No. Beta Blocker:  Yes.   Aspirin:  No. ACEI:  No. ARB:  Yes.   CCB use:  No Other antiplatelets/anticoagulants:  Yes.   Coumadin, variable dosing.    ASSESSMENT/PLAN: This is a 52 y.o. male who presents to [facility] emergency department with a 2-day history of shortness of breath and sharp chest pain on deep inspiration.  Patient's medical history includes significant morbid obesity, chronic kidney disease stage III, chronic lower extremity wounds, CHF with a preserved EF, diabetes mellitus 2, multiple PEs and DVTs with IVC placement, failed Eliquis and Xarelto anticoagulation in the past, and cardiac catheterization on 09/20/2023.  Vascular surgery does not recommend any procedure such as a pulmonary thrombectomy at this time.  Patient's already had multiple pulmonary thrombectomy's 08/04/17, 12/13/22, and 05/27/23, which makes him very high risk for internal bleeding and other complications during this procedure.  Patient has also admitted to being noncompliant with his anticoagulation thus putting himself at higher risk for continued pulmonary embolisms, DVTs and right heart failure.  Also to note patient's CTA today shows a stable chronic PE with embolism to the right main pulmonary artery without heart  strain.  We suggest at this time that the patient be fully anticoagulated and be very compliant with his anticoagulation.  We recommend he stay on Coumadin and be checked extensively with very controlled dosage change to have him retain an INR of 2-2.5 continuously to be best treated for his chronic pulmonary embolisms and DVTs.   -I reviewed the patient's scan and discussed the plan in detail with Dr. Levora Dredge MD and he agrees with the plan.   Marcie Bal [facility] 09/22/2023 5:04 PM

## 2023-09-22 NOTE — Procedures (Signed)
Interventional Radiology Procedure Note  Procedure: Tunneled right internal jugular CVC placement  Complications: None  Estimated Blood Loss: < 10 mL  Findings: Right internal jugular vein tunneled 6 Fr, DL Power Line placed. 25 cm long. Tip at SVC/RA junction. OK to use.  Jodi Marble. Fredia Sorrow, M.D Pager:  [phone]

## 2023-09-22 NOTE — Consult Note (Signed)
Patient Status: [facility] - In-pt  Assessment and Plan: Patient in need of venous access for home milrinone.  Patient with history of HTN, PVD, DM, DVT/PE, CKD, and right-sided heart failure due to severe pulmonary hypertension currently being maintained on milrinone.  IR consulted for tunneled central venous catheter placement.  Discussed with patient who is familiar with tunneled line procedure and care from prior HD catheter placement.  He has concerns about long-term catheter and would like to discuss further with HF team.  Ordering team made aware.   Procedure, risks, and benefits have been discussed with patient who has had an opportunity to ask questions all of which have been answered.  If he agrees to line placement in the future, he may sign consent in IR prior to procedure.   ______________________________________________________________________   History of Present Illness: Ryan [name] is a 54 y.o. male HTN, PVD, DM, DVT/PE, CKD, and right-sided heart failure due to severe pulmonary hypertension currently being maintained on milrinone.IR consulted for tunneled central venous catheter placement for central access needs after discharge.  Currently has a right-sided PICC line in place, in use.   Allergies and medications reviewed.   Review of Systems: A 12 point ROS discussed and pertinent positives are indicated in the HPI above.  All other systems are negative.  Review of Systems  Constitutional:  Negative for fatigue and fever.  Respiratory:  Positive for shortness of breath. Negative for cough.   Cardiovascular:  Negative for chest pain.  Gastrointestinal:  Negative for abdominal pain, diarrhea, nausea and vomiting.  Musculoskeletal:  Negative for back pain.  Psychiatric/Behavioral:  Negative for behavioral problems.     Vital Signs: BP 127/85   Pulse 95   Temp 98.3 F (36.8 C) (Axillary)   Resp 20   Ht 5\' 7"  (1.702 m)   Wt (!) 354 lb 15.1 oz (161 kg)   SpO2  94%   BMI 55.59 kg/m   Physical Exam Vitals and nursing note reviewed.  Constitutional:      General: He is not in acute distress.    Appearance: He is well-developed. He is not ill-appearing.  Cardiovascular:     Rate and Rhythm: Normal rate and regular rhythm.  Musculoskeletal:     Cervical back: Normal range of motion.  Skin:    General: Skin is warm and dry.  Neurological:     General: No focal deficit present.     Mental Status: He is alert and oriented to person, place, and time.  Psychiatric:        Mood and Affect: Mood normal.        Behavior: Behavior normal.      Imaging reviewed.   Labs:  COAGS: Recent Labs    12/16/22 0445 12/17/22 0514 12/23/22 0951 05/16/23 0958 05/17/23 0419 08/18/23 2005 08/20/23 0032 09/19/23 1351 09/19/23 2216 09/21/23 0430 09/22/23 0413  INR 1.6* 2.1*   < > 1.2   < >  --    < > 1.7* 1.3* 1.5* 1.7*  APTT 100* 118*  --  29  --  27  --   --   --   --   --    < > = values in this interval not displayed.    BMP: Recent Labs    08/23/23 0459 08/26/23 1445 09/20/23 0054 09/20/23 0814 09/21/23 0430 09/22/23 0413  NA 134* 139 137 142  141 137 135  K 3.8 4.9 3.8 3.9  4.0 3.6 3.7  CL 99  104 102  --  103 104  CO2 21* 19* 25  --  25 21*  GLUCOSE 99 112* 101*  --  114* 110*  BUN 40* 27* 24*  --  25* 23*  CALCIUM 8.9 9.3 8.8*  --  8.6* 8.4*  CREATININE 1.75* 1.48* 1.55*  --  1.50* 1.44*  GFRNONAA 46*  --  53*  --  55* 58*       Electronically Signed: Hoyt Koch, PA 09/22/2023, 1:39 PM   I spent a total of 15 minutes in face to face in clinical consultation, greater than 50% of which was counseling/coordinating care for venous access.

## 2023-09-22 NOTE — Progress Notes (Signed)
ANTICOAGULATION CONSULT NOTE  Pharmacy Consult for heparin infusion and resume PTA warfarin  Indication: CTEPH / recurrent DVT / PE  Patient Measurements: Height: 5\' 7"  (170.2 cm) Weight: (!) 161 kg (354 lb 15.1 oz) IBW/kg (Calculated) : 66.1 Heparin Dosing Weight: 104.8 kg  Labs: Recent Labs    09/19/23 2216 09/20/23 0054 09/20/23 0814 09/20/23 1430 09/21/23 0430 09/21/23 1121 09/22/23 0413  HGB 15.1  --  14.6  14.6  --  13.7  --  13.5  HCT 49.3  --  43.0  43.0  --  41.0  --  40.2  PLT 234  --   --   --  232  --  229  LABPROT 16.7*  --   --   --  18.6*  --  20.4*  INR 1.3*  --   --   --  1.5*  --  1.7*  HEPARINUNFRC  --   --   --    < > 0.47 0.41 0.43  CREATININE  --  1.55*  --   --  1.50*  --  1.44*  TROPONINIHS 68* 86*  --   --   --   --   --    < > = values in this interval not displayed.    Estimated Creatinine Clearance: 87.4 mL/min (A) (by C-G formula based on SCr of 1.44 mg/dL (H)).   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  Regular the workup has not been forthcoming) previously followed by Dr. Isaiah Serge at [facility]   Hepatic steatosis    Hypertension    Morbid obesity with BMI of 50.0-59.9, adult (HCC)    Oxygen deficiency    Peripheral vascular disease (HCC)    Presence of IVC filter    Has 2 IVC filters with significant thrombus burden superior to the filter.   Pulmonary embolus (HCC) 12/2016   Initially just treated with anticoagulation, but in February 2023 in April 2024 treated with EKOS thrombectomy for submassive PE.   Ventricular trigeminy    Weakness of  both lower limbs     Assessment: Pt is a 53 yo male w/ hx of DVT/PE on warfarin (last dose 1/19 @ 2345) presenting to ED c/o SOB x 1.5 days found with elevated troponin I level trending up.  Pt has/had cardiac cath procedure scheduled for today at 0730. Hx: multiple DVTs/PES dating back to 60s. Per chart review, has MTHFR gene mutation and was previously followed by Hematology (Dr. Isaiah Serge)   Last note from anticoagulation clinic on 09/15/23 noted INR goal 2.5-3 given recurrent thromboembolic events with INR < 2. Patient with supratherapeutic INRs on 1/16 and 1/17 so dose was decreased from 7.5 mg daily to 5 mg daily and patient was instructed to hold warfarin until 1/19.  HL: 1/21 1430 HL= 0.10  SUBtherapeutic (drawn ~ 4 hours after heparin drip restart from procedure) 1/21 2244 HL 0.21, subtherapeutic 1/22 0430 HL 0.47, therapeutic x 1 1/22 1121 HL 0.41, therapeutic x 2 1/23 0413 HL 0.43, therapeutic x 3  INR: 1/20 INR 1.3 (per  med rec pt took 7.5 mg dose PTA) 1/21 no INR  resume warfarin 7.5mg  x 1 , check INR in am 1/22 INR 1.5 1/23 INR 1.7  Goal of Therapy:  Heparin level 0.3-0.7 units/ml INR 2.5 - 3 Monitor platelets by anticoagulation protocol: Yes   Plan:  Heparin: --Continue heparin infusion at 1750 units/hr --Will recheck HL tomorrow AM --Will bridge with heparin drip until INR therapeutic with warfarin --CBC daily while on heparin  Warfarin: --Warfarin level is LOW and suspect this is due to held doses (likely 1/16 - 1/18) given supratherapeutic INRs in outpatient setting --Will give warfarin 7.5 mg PO x 1 dose tonight --INR daily and CBC per protocol  Otelia Sergeant, PharmD, [facility] 09/22/2023 5:41 AM

## 2023-09-22 NOTE — Plan of Care (Signed)
  Problem: Coping: Goal: Ability to adjust to condition or change in health will improve Outcome: Progressing   Problem: Fluid Volume: Goal: Ability to maintain a balanced intake and output will improve Outcome: Progressing   Problem: Health Behavior/Discharge Planning: Goal: Ability to identify and utilize available resources and services will improve Outcome: Progressing Goal: Ability to manage health-related needs will improve Outcome: Progressing   Problem: Metabolic: Goal: Ability to maintain appropriate glucose levels will improve Outcome: Progressing   Problem: Nutritional: Goal: Maintenance of adequate nutrition will improve Outcome: Progressing   Problem: Skin Integrity: Goal: Risk for impaired skin integrity will decrease Outcome: Progressing   Problem: Tissue Perfusion: Goal: Adequacy of tissue perfusion will improve Outcome: Progressing   Problem: Activity: Goal: Ability to tolerate increased activity will improve Outcome: Progressing   Problem: Cardiac: Goal: Ability to achieve and maintain adequate cardiovascular perfusion will improve Outcome: Progressing   Problem: Health Behavior/Discharge Planning: Goal: Ability to safely manage health-related needs after discharge will improve Outcome: Progressing   Problem: Education: Goal: Understanding of CV disease, CV risk reduction, and recovery process will improve Outcome: Progressing   Problem: Activity: Goal: Ability to return to baseline activity level will improve Outcome: Progressing   Problem: Cardiovascular: Goal: Ability to achieve and maintain adequate cardiovascular perfusion will improve Outcome: Progressing Goal: Vascular access site(s) Level 0-1 will be maintained Outcome: Progressing   Problem: Health Behavior/Discharge Planning: Goal: Ability to safely manage health-related needs after discharge will improve Outcome: Progressing   Problem: Education: Goal: Knowledge of General  Education information will improve Description: Including pain rating scale, medication(s)/side effects and non-pharmacologic comfort measures Outcome: Progressing   Problem: Health Behavior/Discharge Planning: Goal: Ability to manage health-related needs will improve Outcome: Progressing   Problem: Clinical Measurements: Goal: Ability to maintain clinical measurements within normal limits will improve Outcome: Progressing Goal: Will remain free from infection Outcome: Progressing Goal: Diagnostic test results will improve Outcome: Progressing Goal: Respiratory complications will improve Outcome: Progressing   Problem: Activity: Goal: Risk for activity intolerance will decrease Outcome: Progressing   Problem: Nutrition: Goal: Adequate nutrition will be maintained Outcome: Progressing   Problem: Coping: Goal: Level of anxiety will decrease Outcome: Progressing   Problem: Elimination: Goal: Will not experience complications related to bowel motility Outcome: Progressing Goal: Will not experience complications related to urinary retention Outcome: Progressing   Problem: Pain Managment: Goal: General experience of comfort will improve and/or be controlled Outcome: Progressing   Problem: Safety: Goal: Ability to remain free from injury will improve Outcome: Progressing   Problem: Skin Integrity: Goal: Risk for impaired skin integrity will decrease Outcome: Progressing

## 2023-09-22 NOTE — Progress Notes (Signed)
Progress Note   Patient: Ryan [name] ZDG:[id number] DOB: [birth year] DOA: 09/20/2023     2 DOS: the patient was seen and examined on 09/22/2023   Brief hospital course: ZERION THELL is a 53 y.o. 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   Patient is admitted for further management evaluation of acute dyspnea, elevated troponin.   Patient ordered right heart cath which showed severely elevated PA pressures and reduced cardiac index.  Patient started on milrinone, sildenafil therapy.  He is continued on digoxin, spironolactone therapy.  Patient is currently on 4 to 5 L supplemental oxygen.  Assessment and Plan: * Acute on chronic diastolic CHF Acute hypoxic respiratory failure Severe pulmonary hypertension History of prior pulmonary embolism CTA with chronic pulmonary embolism.  Patient follows vascular team and had thrombectomy done last November. Cardiology evaluated the patient recommended right heart cath which showed severely elevated PA pressures and reduced cardiac index.  Patient is started on IV milrinone and sildenafil therapy.   Patient will be continued on heparin drip per protocol. Patient got tunneled right internal jugular CVC placement for milrinone gtt. Patient is on Lasix 160mg  bid per heart failure team. Patient is currently on 4-5 L supplemental oxygen to maintain saturation greater than 90%. Patient will be continued on digoxin, spironolactone. Hold metoprolol, losartan due to borderline low blood pressures. Heart failure team advises [facility] vascular team intervention upon discharge. Asked vascular team here to evaluate him.  Type 2 diabetes  mellitus with diabetic chronic kidney disease (HCC) A1c 6.1. He is eating fair. Hypoglycemia protocol  Essential hypertension Caution with antihypertensives as blood pressure borderline low.  CKD (chronic kidney disease), stage IIIa Renal function at baseline. Continue to monitor while on diuretics. Avoid other nephrotoxic drugs.  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 Holding Coumadin for now  Morbid obesity with BMI of 50.0-59.9, adult (HCC) Complicating factor to overall prognosis and care. Diet, exercise and weight reduction advised.  Chronic pulmonary embolism (HCC) Causing elevated pulmonary pressures Chronic DVT s/p IVC. Further management as outpatient. He would benefit from thrombectomy at a later time per heart failure team.     Out of bed to chair. Incentive spirometry. Nursing supportive care. Fall, aspiration precautions. DVT prophylaxis   Code Status: Full Code  Subjective: Patient is seen and examined today morning.  He is lying in bed, continues to be hypoxic requiring 4 to 5 L supplemental oxygen.  States he is getting out of bed.  Eating fair.  Physical Exam: Vitals:   09/22/23 1200 09/22/23 1300 09/22/23 1351 09/22/23 1600  BP: 122/71 119/73    Pulse: 95 99    Resp:      Temp:    (!) 97.4 F (36.3 C)  TempSrc:    Oral  SpO2: 92% 97% 91%   Weight:      Height:        General - Middle aged morbidly obese African-American male, moderate respiratory distress HEENT - PERRLA, EOMI, atraumatic head, non tender sinuses. Lung -distant breath sounds, diffuse rales, rhonchi, no wheezes. Heart - S1, S2 heard, no murmurs, rubs, 1+  pitting pedal edema. Abdomen - Soft, non tender obese, bowel sounds good Neuro - Alert, awake and oriented x 3, non focal exam. Skin - Warm and dry.  Data Reviewed:      Latest Ref Rng & Units 09/22/2023     4:13 AM 09/21/2023    4:30 AM 09/20/2023    8:14 AM  CBC  WBC 4.0 - 10.5 K/uL 5.7  4.6    Hemoglobin 13.0 - 17.0 g/dL 16.1  09.6  04.5    40.9   Hematocrit 39.0 - 52.0 % 40.2  41.0  43.0    43.0   Platelets 150 - 400 K/uL 229  232        Latest Ref Rng & Units 09/22/2023    4:13 AM 09/21/2023    4:30 AM 09/20/2023    8:14 AM  BMP  Glucose 70 - 99 mg/dL 811  914    BUN 6 - 20 mg/dL 23  25    Creatinine 7.82 - 1.24 mg/dL 9.56  2.13    Sodium 086 - 145 mmol/L 135  137  141    142   Potassium 3.5 - 5.1 mmol/L 3.7  3.6  4.0    3.9   Chloride 98 - 111 mmol/L 104  103    CO2 22 - 32 mmol/L 21  25    Calcium 8.9 - 10.3 mg/dL 8.4  8.6     IR Fluoro Guide CV Line Right Result Date: 09/22/2023 CLINICAL DATA:  Heart failure and need for tunneled central venous catheter for continuous milrinone infusion therapy. EXAM: TUNNELED CENTRAL VENOUS CATHETER PLACEMENT WITH ULTRASOUND AND FLUOROSCOPIC GUIDANCE ANESTHESIA/SEDATION: None MEDICATIONS: None FLUOROSCOPY: 32 seconds.  35.5 mGy. PROCEDURE: The procedure, risks, benefits, and alternatives were explained to the patient. Questions regarding the procedure were encouraged and answered. The patient understands and consents to the procedure. A timeout was performed prior to initiating the procedure. The right neck and chest were prepped with chlorhexidine in a sterile fashion, and a sterile drape was applied covering the operative field. Maximum barrier sterile technique with sterile gowns and gloves were used for the procedure. Local anesthesia was provided with 1% lidocaine. Ultrasound was used to confirm patency of the right internal jugular vein. Under ultrasound image was saved and recorded. After creating a small venotomy incision, a 21 gauge needle was advanced into the right internal jugular vein under direct, real-time ultrasound guidance. Ultrasound image documentation was performed. After securing guidewire access, a 6 French peel-away sheath was  placed. A wire was kinked to measure appropriate catheter length. A 6 [place], dual-lumen power line was chosen for placement. This was tunneled in a retrograde fashion from the chest wall to the venotomy incision. The catheter was cut to 25 cm based on guidewire measurement. The catheter was then placed through the sheath and the sheath removed. Final catheter positioning was confirmed and documented with a fluoroscopic spot image. The catheter was aspirated, flushed with saline, and injected with appropriate volume heparin dwells. The venotomy incision was closed with subcuticular 4-0 Vicryl. Dermabond was applied to the incision. The catheter exit site was secured with 0-Prolene retention sutures. COMPLICATIONS: None.  No pneumothorax. FINDINGS: After catheter placement, the tip lies at the SVC/RA junction. The catheter aspirates normally and is ready for immediate use. IMPRESSION: Placement of tunneled central venous catheter via the right internal jugular vein. The catheter tip lies at the SVC/RA junction. The catheter is ready for immediate use. Electronically Signed   By:  Irish Lack M.D.   On: 09/22/2023 17:04   ECHOCARDIOGRAM COMPLETE Result Date: 09/21/2023    ECHOCARDIOGRAM REPORT   Patient Name:   CAMDAN FLORESCA Date of Exam: 09/20/2023 Medical Rec #:  [id number]     Height:       67.0 in Accession #:    [id number]    Weight:       363.8 lb Date of Birth:  02-18-1971     BSA:          2.608 m Patient Age:    52 years      BP:           103/74 mmHg Patient Gender: M             HR:           76 bpm. Exam Location:  [facility] Procedure: 2D Echo, Cardiac Doppler and Color Doppler Indications:     Right Ventricular Failure  History:         Patient has prior history of Echocardiogram examinations, most                  recent 06/21/2023. CHF; Risk Factors:Hypertension and Diabetes.                  DVT. History of pulmonary embolus. Chronic kidney disease.  Sonographer:     Sedonia Small Rodgers-Jones RDCS  Referring Phys:  [id number] Benay Spice [facility] Diagnosing Phys: Yvonne Kendall MD IMPRESSIONS  1. Left ventricular ejection fraction, by estimation, is 65 to 70%. The left ventricle has normal function. The left ventricle has no regional wall motion abnormalities. Left ventricular diastolic parameters are consistent with Grade I diastolic dysfunction (impaired relaxation). There is the interventricular septum is flattened in systole, consistent with right ventricular pressure overload.  2. Right ventricular systolic function is mildly reduced. The right ventricular size is moderately enlarged. Tricuspid regurgitation signal is inadequate for assessing PA pressure.  3. Right atrial size was mildly dilated.  4. The mitral valve is normal in structure. No evidence of mitral valve regurgitation.  5. The aortic valve has an indeterminant number of cusps. There is mild calcification of the aortic valve. There is mild thickening of the aortic valve. Aortic valve regurgitation is not visualized. Aortic valve sclerosis/calcification is present, without any evidence of aortic stenosis.  6. There is borderline dilatation of the ascending aorta, measuring 36 mm. FINDINGS  Left Ventricle: Left ventricular ejection fraction, by estimation, is 65 to 70%. The left ventricle has normal function. The left ventricle has no regional wall motion abnormalities. The left ventricular internal cavity size was normal in size. There is  borderline left ventricular hypertrophy. The interventricular septum is flattened in systole, consistent with right ventricular pressure overload. Left ventricular diastolic parameters are consistent with Grade I diastolic dysfunction (impaired relaxation). Right Ventricle: The right ventricular size is moderately enlarged. No increase in right ventricular wall thickness. Right ventricular systolic function is mildly reduced. Tricuspid regurgitation signal is inadequate for assessing PA pressure. Left Atrium:  Left atrial size was normal in size. Right Atrium: Right atrial size was mildly dilated. Pericardium: There is no evidence of pericardial effusion. Mitral Valve: The mitral valve is normal in structure. No evidence of mitral valve regurgitation. Tricuspid Valve: The tricuspid valve is normal in structure. Tricuspid valve regurgitation is trivial. Aortic Valve: The aortic valve has an indeterminant number of cusps. There is mild calcification of the aortic valve. There is mild thickening of the aortic valve.  There is mild aortic valve annular calcification. Aortic valve regurgitation is not visualized. Aortic valve sclerosis/calcification is present, without any evidence of aortic stenosis. Aortic valve mean gradient measures 6.0 mmHg. Aortic valve peak gradient measures 10.5 mmHg. Aortic valve area, by VTI measures 3.53 cm. Pulmonic Valve: The pulmonic valve was normal in structure. Pulmonic valve regurgitation is not visualized. No evidence of pulmonic stenosis. Aorta: The aortic root is normal in size and structure. There is borderline dilatation of the ascending aorta, measuring 36 mm. Pulmonary Artery: The pulmonary artery is of normal size. IAS/Shunts: The interatrial septum was not well visualized.  LEFT VENTRICLE PLAX 2D LVIDd:         3.90 cm   Diastology LVIDs:         2.70 cm   LV e' medial:    6.09 cm/s LV PW:         1.10 cm   LV E/e' medial:  10.8 LV IVS:        1.10 cm   LV e' lateral:   13.55 cm/s LVOT diam:     2.20 cm   LV E/e' lateral: 4.9 LV SV:         88 LV SV Index:   34 LVOT Area:     3.80 cm  RIGHT VENTRICLE RV Basal diam:  6.10 cm RV S prime:     14.00 cm/s TAPSE (M-mode): 1.9 cm LEFT ATRIUM             Index        RIGHT ATRIUM           Index LA diam:        3.90 cm 1.50 cm/m   RA Area:     24.00 cm LA Vol (A2C):   74.7 ml 28.65 ml/m  RA Volume:   89.60 ml  34.36 ml/m LA Vol (A4C):   51.5 ml 19.75 ml/m LA Biplane Vol: 64.0 ml 24.54 ml/m  AORTIC VALVE AV Area (Vmax):    3.10 cm AV  Area (Vmean):   2.80 cm AV Area (VTI):     3.53 cm AV Vmax:           162.02 cm/s AV Vmean:          116.683 cm/s AV VTI:            0.251 m AV Peak Grad:      10.5 mmHg AV Mean Grad:      6.0 mmHg LVOT Vmax:         132.00 cm/s LVOT Vmean:        85.833 cm/s LVOT VTI:          0.233 m LVOT/AV VTI ratio: 0.93  AORTA Ao Root diam: 3.80 cm Ao Asc diam:  3.60 cm MITRAL VALVE MV Area (PHT): 2.81 cm    SHUNTS MV Decel Time: 270 msec    Systemic VTI:  0.23 m MV E velocity: 66.05 cm/s  Systemic Diam: 2.20 cm MV A velocity: 92.25 cm/s MV E/A ratio:  0.72 Cristal Deer End MD Electronically signed by Yvonne Kendall MD Signature Date/Time: 09/21/2023/7:31:44 AM    Final    Family Communication: Discussed with patient, he understand and agree. All questions answereed.  Disposition: Status is: Inpatient Remains inpatient appropriate because: IV diuretics, milrinone, heparin drip, supplemental oxygen  Planned Discharge Destination: Home with Home Health     MDM level 3-patient is short of breath, requiring 4 to 5 L supplemental O2.  Patient  is on milrinone drip, high dose IV diuretics and heparin drip.  Patient will need hemodynamic, electrolyte, telemetry close monitoring in the unit.  Patient is at high risk for sudden clinical deterioration.  Author: Marcelino Duster, MD 09/22/2023 5:20 PM Secure chat 7am to 7pm For on call review [email or web address].

## 2023-09-23 ENCOUNTER — Inpatient Hospital Stay: Payer: No Typology Code available for payment source

## 2023-09-23 DIAGNOSIS — I50812 Chronic right heart failure: Secondary | ICD-10-CM | POA: Diagnosis not present

## 2023-09-23 DIAGNOSIS — R0602 Shortness of breath: Secondary | ICD-10-CM

## 2023-09-23 DIAGNOSIS — R0600 Dyspnea, unspecified: Secondary | ICD-10-CM | POA: Diagnosis not present

## 2023-09-23 DIAGNOSIS — I10 Essential (primary) hypertension: Secondary | ICD-10-CM | POA: Diagnosis not present

## 2023-09-23 DIAGNOSIS — I825Z3 Chronic embolism and thrombosis of unspecified deep veins of distal lower extremity, bilateral: Secondary | ICD-10-CM | POA: Diagnosis not present

## 2023-09-23 DIAGNOSIS — I2782 Chronic pulmonary embolism: Secondary | ICD-10-CM | POA: Diagnosis not present

## 2023-09-23 DIAGNOSIS — I5033 Acute on chronic diastolic (congestive) heart failure: Secondary | ICD-10-CM | POA: Diagnosis not present

## 2023-09-23 LAB — MAGNESIUM: Magnesium: 2.2 mg/dL (ref 1.7–2.4)

## 2023-09-23 LAB — CBC
HCT: 43.9 % (ref 39.0–52.0)
Hemoglobin: 14.3 g/dL (ref 13.0–17.0)
MCH: 30.9 pg (ref 26.0–34.0)
MCHC: 32.6 g/dL (ref 30.0–36.0)
MCV: 94.8 fL (ref 80.0–100.0)
Platelets: 250 10*3/uL (ref 150–400)
RBC: 4.63 MIL/uL (ref 4.22–5.81)
RDW: 14.7 % (ref 11.5–15.5)
WBC: 5.7 10*3/uL (ref 4.0–10.5)
nRBC: 0 % (ref 0.0–0.2)

## 2023-09-23 LAB — COOXEMETRY PANEL
Carboxyhemoglobin: 1.5 % (ref 0.5–1.5)
Methemoglobin: <0.7 % (ref 0.0–1.5)
O2 Saturation: 65 %
Total hemoglobin: 15.6 g/dL (ref 12.0–16.0)
Total oxygen content: 63.8 %

## 2023-09-23 LAB — GLUCOSE, CAPILLARY
Glucose-Capillary: 120 mg/dL — ABNORMAL HIGH (ref 70–99)
Glucose-Capillary: 121 mg/dL — ABNORMAL HIGH (ref 70–99)
Glucose-Capillary: 134 mg/dL — ABNORMAL HIGH (ref 70–99)

## 2023-09-23 LAB — BASIC METABOLIC PANEL
Anion gap: 13 (ref 5–15)
BUN: 23 mg/dL — ABNORMAL HIGH (ref 6–20)
CO2: 22 mmol/L (ref 22–32)
Calcium: 9 mg/dL (ref 8.9–10.3)
Chloride: 102 mmol/L (ref 98–111)
Creatinine, Ser: 1.56 mg/dL — ABNORMAL HIGH (ref 0.61–1.24)
GFR, Estimated: 53 mL/min — ABNORMAL LOW (ref >60)
Glucose, Bld: 113 mg/dL — ABNORMAL HIGH (ref 70–99)
Potassium: 4.6 mmol/L (ref 3.5–5.1)
Sodium: 137 mmol/L (ref 135–145)

## 2023-09-23 LAB — PROTIME-INR
INR: 1.8 — ABNORMAL HIGH (ref 0.8–1.2)
Prothrombin Time: 21.5 s — ABNORMAL HIGH (ref 11.4–15.2)

## 2023-09-23 LAB — HEPARIN LEVEL (UNFRACTIONATED): Heparin Unfractionated: 0.3 [IU]/mL (ref 0.30–0.70)

## 2023-09-23 MED ORDER — WARFARIN SODIUM 10 MG PO TABS
10.0000 mg | ORAL_TABLET | Freq: Once | ORAL | Status: AC
  Administered 2023-09-23: 10 mg via ORAL
  Filled 2023-09-23: qty 1

## 2023-09-23 NOTE — Plan of Care (Signed)
  Problem: Education: Goal: Ability to describe self-care measures that may prevent or decrease complications (Diabetes Survival Skills Education) will improve Outcome: Progressing   Problem: Coping: Goal: Ability to adjust to condition or change in health will improve Outcome: Progressing   Problem: Fluid Volume: Goal: Ability to maintain a balanced intake and output will improve Outcome: Progressing   Problem: Health Behavior/Discharge Planning: Goal: Ability to identify and utilize available resources and services will improve Outcome: Progressing   Problem: Metabolic: Goal: Ability to maintain appropriate glucose levels will improve Outcome: Progressing   Problem: Nutritional: Goal: Maintenance of adequate nutrition will improve Outcome: Progressing   Problem: Skin Integrity: Goal: Risk for impaired skin integrity will decrease Outcome: Progressing   Problem: Tissue Perfusion: Goal: Adequacy of tissue perfusion will improve Outcome: Progressing   Problem: Education: Goal: Understanding of cardiac disease, CV risk reduction, and recovery process will improve Outcome: Progressing   Problem: Cardiac: Goal: Ability to achieve and maintain adequate cardiovascular perfusion will improve Outcome: Progressing   Problem: Activity: Goal: Ability to return to baseline activity level will improve Outcome: Progressing

## 2023-09-23 NOTE — Progress Notes (Signed)
ANTICOAGULATION CONSULT NOTE  Pharmacy Consult for heparin infusion and resume PTA warfarin  Indication: CTEPH / recurrent DVT / PE  Patient Measurements: Height: 5\' 7"  (170.2 cm) Weight:  (bed will not weigh keeping saying error) IBW/kg (Calculated) : 66.1 Heparin Dosing Weight: 104.8 kg  Labs: Recent Labs    09/21/23 0430 09/21/23 1121 09/22/23 0413 09/22/23 1837 09/23/23 0524  HGB 13.7  --  13.5 14.9 14.3  HCT 41.0  --  40.2 46.1 43.9  PLT 232  --  229 233 250  LABPROT 18.6*  --  20.4*  --  21.5*  INR 1.5*  --  1.7*  --  1.8*  HEPARINUNFRC 0.47 0.41 0.43  --  0.30  CREATININE 1.50*  --  1.44* 1.39* 1.56*    Estimated Creatinine Clearance: 80.6 mL/min (A) (by C-G formula based on SCr of 1.56 mg/dL (H)).   Medical History: Past Medical History:  Diagnosis Date   (HFpEF) heart failure with preserved ejection fraction (HCC)    Arrhythmia    atrial fibrillation   CHF (congestive heart failure) (HCC)    Chronic kidney disease 08/2013   Diabetes mellitus type II, non insulin dependent (HCC)    Diabetes mellitus without complication (HCC)    per pt-pre diabetic-Dr Ryan [name] stated said pt   DVT (deep venous thrombosis) (HCC)    Over 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  Regular the workup has not been forthcoming) previously [name] at [facility]   Hepatic steatosis    Hypertension    Morbid obesity with BMI of 50.0-59.9, adult (HCC)    Oxygen deficiency    Peripheral vascular disease (HCC)    Presence of IVC filter    Has 2 IVC filters with significant thrombus burden superior to the filter.   Pulmonary embolus (HCC) 12/2016   Initially just treated with anticoagulation, but in February 2023 in April 2024 treated with EKOS thrombectomy for submassive PE.   Ventricular trigeminy    Weakness of both lower limbs     Assessment: Pt is a 53 yo male w/ hx of DVT/PE on warfarin (last dose 1/19 @ 2345) presenting to ED c/o SOB x 1.5 days found with elevated troponin I level trending up.   Pt has/had cardiac cath procedure scheduled for today at 0730. Hx: multiple DVTs/PES dating back to 73s. Per chart review, has MTHFR gene mutation and was previously followed by Hematology (Dr. Isaiah [name])   Last note from anticoagulation clinic on 09/15/23 noted INR goal 2.5-3 given recurrent thromboembolic events with INR < 2. Patient with supratherapeutic INRs on 1/16 and 1/17 so dose was decreased from 7.5 mg daily to 5 mg daily and patient was instructed to hold warfarin until 1/19.  HL: 1/21 1430 HL= 0.10  SUBtherapeutic (drawn ~ 4 hours after heparin drip restart from procedure) 1/21 2244 HL 0.21, subtherapeutic 1/22 0430 HL 0.47, therapeutic x 1 1/22 1121 HL 0.41, therapeutic x 2 1/23 0413 HL 0.43, therapeutic x 3 1/24 0524 HL 0.30, therapeutic x 4  INR: 1/20 INR 1.3 (per med rec pt took 7.5 mg dose PTA) 1/21 no INR  resume warfarin 7.5mg  x 1 , check INR in am 1/22 INR 1.5 1/23 INR 1.7 1/24 INR 1.8  Goal of Therapy:  Heparin level 0.3-0.7 units/ml INR 2.5 - 3 Monitor platelets by anticoagulation protocol: Yes   Plan:  Heparin: --Continue heparin infusion at 1750 units/hr --Will recheck HL tomorrow AM --Will bridge with heparin drip until INR therapeutic with warfarin --CBC daily while  on heparin  Warfarin: --Warfarin level is LOW and suspect this is due to held doses (likely 1/16 - 1/18) given supratherapeutic INRs in outpatient setting --Will give warfarin 10 mg PO x 1 dose tonight --INR daily and CBC per protocol  Ryan [name] 09/23/2023 7:40 AM

## 2023-09-23 NOTE — Progress Notes (Signed)
Occupational Therapy Treatment Patient Details Name: Ryan [name] MRN: [id number] DOB: 01-15-1971 Today's Date: 09/23/2023   History of present illness 53 y.o. male  who presented to the ED with a 2-day history of shortness of breath and sharp chest pain with deep inspiration. R cardiac cath yesterday 1/21. 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   OT comments  Chart reviewed to date, nurse cleared pt for participation in OT tx session targeting continued education on ecs techniques for improved ADL performance. Pt provided hand out and discussed use of plastic bag trick for compression sock management as pt reports he spends all his energy putting on compression socks when they are donned. STS performed with MOD I. Pt declines further amb in hallway/around room. Pt is left in chair, all needs met. OT will continue to follow.       If plan is discharge home, recommend the following:  A little help with walking and/or transfers;A little help with bathing/dressing/bathroom;Assistance with cooking/housework;Assist for transportation;Help with stairs or ramp for entrance   Equipment Recommendations  None recommended by OT    Recommendations for Other Services      Precautions / Restrictions Precautions Precautions: Fall       Mobility Bed Mobility               General bed mobility comments: NT in recliner pre/post session    Transfers Overall transfer level: Modified independent Equipment used: None                     Balance Overall balance assessment: Needs assistance Sitting-balance support: Feet supported Sitting balance-Leahy Scale: Good     Standing balance support: No upper extremity supported Standing balance-Leahy Scale: Good                             ADL either performed or assessed with clinical  judgement   ADL Overall ADL's : Needs assistance/impaired     Grooming: Supervision/safety;Sitting               Lower Body Dressing: Maximal assistance Lower Body Dressing Details (indicate cue type and reason): socks, educated on plastic bag trick for compression sock management Toilet Transfer: Contact guard assist Toilet Transfer Details (indicate cue type and reason): simulated           General ADL Comments: pt declined further mobility on this date    Extremity/Trunk Assessment              Vision       Perception     Praxis      Cognition Arousal: Alert Behavior During Therapy: WFL for tasks assessed/performed Overall Cognitive Status: Within Functional Limits for tasks assessed                                          Exercises Other Exercises Other Exercises: edu re: energy conservation techniques via hand out and demo; pt with no further questions    Shoulder Instructions       General Comments vss throughout    Pertinent Vitals/ Pain       Pain Assessment Pain Assessment: No/denies pain  Home Living  Prior Functioning/Environment              Frequency  Min 1X/week        Progress Toward Goals  OT Goals(current goals can now be found in the care plan section)  Progress towards OT goals: Progressing toward goals  Acute Rehab OT Goals Time For Goal Achievement: 10/05/23  Plan      Co-evaluation                 AM-PAC OT "6 Clicks" Daily Activity     Outcome Measure   Help from another person eating meals?: None Help from another person taking care of personal grooming?: None Help from another person toileting, which includes using toliet, bedpan, or urinal?: A Little Help from another person bathing (including washing, rinsing, drying)?: A Little Help from another person to put on and taking off regular upper body clothing?:  None Help from another person to put on and taking off regular lower body clothing?: A Lot 6 Click Score: 20    End of Session Equipment Utilized During Treatment: Oxygen  OT Visit Diagnosis: Other abnormalities of gait and mobility (R26.89);Other (comment)   Activity Tolerance Patient tolerated treatment well   Patient Left in bed;with call bell/phone within reach   Nurse Communication Mobility status        Time: [id number] OT Time Calculation (min): 15 min  Charges: OT General Charges $OT Visit: 1 Visit OT Treatments $Self Care/Home Management : 8-22 mins  Oleta Mouse, OTD OTR/L  09/23/23, 3:47 PM

## 2023-09-23 NOTE — Progress Notes (Signed)
ANTICOAGULATION CONSULT NOTE  Pharmacy Consult for heparin infusion and resume PTA warfarin  Indication: CTEPH / recurrent DVT / PE  Patient Measurements: Height: 5\' 7"  (170.2 cm) Weight:  (bed will not weigh keeping saying error) IBW/kg (Calculated) : 66.1 Heparin Dosing Weight: 104.8 kg  Labs: Recent Labs    09/21/23 0430 09/21/23 1121 09/22/23 0413 09/22/23 1837 09/23/23 0524  HGB 13.7  --  13.5 14.9 14.3  HCT 41.0  --  40.2 46.1 43.9  PLT 232  --  229 233 250  LABPROT 18.6*  --  20.4*  --  21.5*  INR 1.5*  --  1.7*  --  1.8*  HEPARINUNFRC 0.47 0.41 0.43  --  0.30  CREATININE 1.50*  --  1.44* 1.39* 1.56*    Estimated Creatinine Clearance: 80.6 mL/min (A) (by C-G formula based on SCr of 1.56 mg/dL (H)).   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  Regular the workup has not been forthcoming) previously followed by Dr. Isaiah Serge at [facility]   Hepatic steatosis    Hypertension    Morbid obesity with BMI of 50.0-59.9, adult (HCC)    Oxygen deficiency    Peripheral vascular disease (HCC)    Presence of IVC filter    Has 2 IVC filters with significant thrombus burden superior to the filter.   Pulmonary embolus (HCC) 12/2016   Initially just treated with anticoagulation, but in February 2023 in April 2024 treated with EKOS thrombectomy for submassive PE.   Ventricular trigeminy    Weakness of both lower limbs     Assessment: Pt is a 53 yo male w/ hx of DVT/PE on warfarin (last dose 1/19 @ 2345) presenting to ED c/o SOB x 1.5 days found with elevated troponin I level trending up.   Pt has/had cardiac cath procedure scheduled for today at 0730. 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 Per chart review, has MTHFR gene mutation and was previously followed by Hematology (Dr. Isaiah Serge)   Last note from anticoagulation clinic on 09/15/23 noted INR goal 2.5-3 given recurrent thromboembolic events with INR < 2. Patient with supratherapeutic INRs on 1/16 and 1/17 so dose was decreased from 7.5 mg daily to 5 mg daily and patient was instructed to hold warfarin until 1/19.  HL: 1/21 1430 HL= 0.10  SUBtherapeutic (drawn ~ 4 hours after heparin drip restart from procedure) 1/21 2244 HL 0.21, subtherapeutic 1/22 0430 HL 0.47, therapeutic x 1 1/22 1121 HL 0.41, therapeutic x 2 1/23 0413 HL 0.43, therapeutic x 3 1/24 0524 HL 0.30, therapeutic x 4  INR: 1/20 INR 1.3 (per med rec pt took 7.5 mg dose PTA) 1/21 no INR  resume warfarin 7.5mg  x 1 , check INR in am 1/22 INR 1.5 1/23 INR 1.7 1/24 INR 1.8  Goal of Therapy:  Heparin level 0.3-0.7 units/ml INR 2.5 - 3 Monitor platelets by anticoagulation protocol: Yes   Plan:  Heparin: --Continue heparin infusion at 1750 units/hr --Will recheck HL tomorrow AM --Will bridge with heparin drip until INR therapeutic with warfarin --CBC daily while  on heparin  Warfarin: --Warfarin level is LOW and suspect this is due to held doses (likely 1/16 - 1/18) given supratherapeutic INRs in outpatient setting --Will give warfarin 7.5 mg PO x 1 dose tonight --INR daily and CBC per protocol  Otelia Sergeant, PharmD, [facility] 09/23/2023 6:50 AM

## 2023-09-23 NOTE — Plan of Care (Signed)
Problem: Education: Goal: Ability to describe self-care measures that may prevent or decrease complications (Diabetes Survival Skills Education) will improve Outcome: Progressing   Problem: Fluid Volume: Goal: Ability to maintain a balanced intake and output will improve Outcome: Progressing   Problem: Nutritional: Goal: Maintenance of adequate nutrition will improve Outcome: Progressing

## 2023-09-23 NOTE — Progress Notes (Signed)
Progress Note   Patient: Ryan [name] FAO:[id number] DOB: May 16, 1971 DOA: 09/20/2023     3 DOS: the patient was seen and examined on 09/23/2023   Brief hospital course: HADI DUBIN is a 53 y.o. 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   Patient is admitted for further management evaluation of acute dyspnea, elevated troponin.   Patient ordered right heart cath which showed severely elevated PA pressures and reduced cardiac index.  Patient started on milrinone, sildenafil therapy.  He is continued on digoxin, spironolactone therapy.  Patient is currently on 4 to 5 L supplemental oxygen.  Assessment and Plan: * Acute on chronic diastolic CHF Acute hypoxic respiratory failure Severe pulmonary hypertension History of prior pulmonary embolism CTA with chronic pulmonary embolism.  Patient follows vascular team and had thrombectomy done last November. Cardiology evaluated the patient recommended right heart cath which showed severely elevated PA pressures and reduced cardiac index.   09/20/23 Patient is started on IV milrinone and sildenafil therapy.   1/23 tunneled right internal jugular CVC placement for milrinone gtt. Heparin held 09/23/23 due to hemoptysis. Continued on Coumadin. Continue Lasix 160mg  bid per heart failure team. Continues to be on 4-5 L supplemental oxygen to maintain saturation greater than 90%. Continue digoxin, spironolactone. Hold metoprolol, losartan due to borderline low blood pressures. Heart failure team advises [facility] vascular team intervention upon discharge. Asked vascular team here to evaluate him.  Type 2 diabetes mellitus with diabetic  chronic kidney disease (HCC) A1c 6.1. He is eating fair. Hypoglycemia protocol  Essential hypertension BP borderline low. Caution with antihypertensives as blood pressure borderline low.  CKD (chronic kidney disease), stage IIIa Renal function at baseline. Continue to monitor while on diuretics. Avoid other nephrotoxic drugs.  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 Holding Coumadin for now  Morbid obesity with BMI of 50.0-59.9, adult (HCC) Complicating factor to overall prognosis and care. Diet, exercise and weight reduction advised.  Chronic pulmonary embolism (HCC) Causing elevated pulmonary pressures Chronic DVT s/p IVC. Further management as outpatient. He would benefit from thrombectomy at a later time per heart failure team.     Out of bed to chair. Incentive spirometry. Nursing supportive care. Fall, aspiration precautions. DVT prophylaxis   Code Status: Full Code  Subjective: Patient is seen and examined today morning.  He had blood while coughing. Heparin drip held. He is on 4 to 5 L supplemental oxygen.  Eating fair.   Physical Exam: Vitals:   09/23/23 1300 09/23/23 1400 09/23/23 1500 09/23/23 1600  BP:   98/64   Pulse: 95 94 85 96  Resp: 20  (!) 21 (!) 24  Temp:    97.8 F (36.6 C)  TempSrc:    Oral  SpO2: 93% (!) 89% 91% 90%  Weight:      Height:        General - Middle aged morbidly obese African-American male, moderate respiratory distress HEENT - PERRLA, EOMI, atraumatic head, non tender sinuses. Lung -distant breath sounds, diffuse rales, rhonchi, no wheezes. Heart - S1, S2 heard, no murmurs, rubs, 1+ pitting pedal edema. Abdomen - Soft,  non tender obese, bowel sounds good Neuro - Alert, awake and oriented x 3, non focal exam. Skin - Warm and dry.  Data Reviewed:      Latest Ref Rng & Units 09/23/2023    5:24 AM  09/22/2023    6:37 PM 09/22/2023    4:13 AM  CBC  WBC 4.0 - 10.5 K/uL 5.7  5.0  5.7   Hemoglobin 13.0 - 17.0 g/dL 82.9  56.2  13.0   Hematocrit 39.0 - 52.0 % 43.9  46.1  40.2   Platelets 150 - 400 K/uL 250  233  229       Latest Ref Rng & Units 09/23/2023    5:24 AM 09/22/2023    6:37 PM 09/22/2023    4:13 AM  BMP  Glucose 70 - 99 mg/dL 865  784  696   BUN 6 - 20 mg/dL 23  22  23    Creatinine 0.61 - 1.24 mg/dL 2.95  2.84  1.32   Sodium 135 - 145 mmol/L 137  134  135   Potassium 3.5 - 5.1 mmol/L 4.6  4.2  3.7   Chloride 98 - 111 mmol/L 102  97  104   CO2 22 - 32 mmol/L 22  23  21    Calcium 8.9 - 10.3 mg/dL 9.0  9.1  8.4    DG Chest Port 1 View Result Date: 09/23/2023 CLINICAL DATA:  [id number] Hemoptysis [id number] EXAM: PORTABLE CHEST 1 VIEW COMPARISON:  CXR 09/19/23 FINDINGS: No pleural effusion. No pneumothorax. Cardiomegaly. No focal airspace opacity. There are prominent bilateral interstitial opacities that could represent pulmonary venous congestion without overt pulmonary edema. No radiographically apparent displaced rib fractures. Visualized upper abdomen is unremarkable. IMPRESSION: Borderline cardiomegaly and likely pulmonary venous congestion. No evidence of overt pulmonary edema. Electronically Signed   By: Lorenza Cambridge M.D.   On: 09/23/2023 09:58   IR Fluoro Guide CV Line Right Result Date: 09/22/2023 CLINICAL DATA:  Heart failure and need for tunneled central venous catheter for continuous milrinone infusion therapy. EXAM: TUNNELED CENTRAL VENOUS CATHETER PLACEMENT WITH ULTRASOUND AND FLUOROSCOPIC GUIDANCE ANESTHESIA/SEDATION: None MEDICATIONS: None FLUOROSCOPY: 32 seconds.  35.5 mGy. PROCEDURE: The procedure, risks, benefits, and alternatives were explained to the patient. Questions regarding the procedure were encouraged and answered. The patient understands and consents to the procedure. A timeout was performed prior to initiating the procedure. The right neck and chest were prepped  with chlorhexidine in a sterile fashion, and a sterile drape was applied covering the operative field. Maximum barrier sterile technique with sterile gowns and gloves were used for the procedure. Local anesthesia was provided with 1% lidocaine. Ultrasound was used to confirm patency of the right internal jugular vein. Under ultrasound image was saved and recorded. After creating a small venotomy incision, a 21 gauge needle was advanced into the right internal jugular vein under direct, real-time ultrasound guidance. Ultrasound image documentation was performed. After securing guidewire access, a 6 French peel-away sheath was placed. A wire was kinked to measure appropriate catheter length. A 6 [place], dual-lumen power line was chosen for placement. This was tunneled in a retrograde fashion from the chest wall to the venotomy incision. The catheter was cut to 25 cm based on guidewire measurement. The catheter was then placed through the sheath and the sheath removed. Final catheter positioning was confirmed and documented with a fluoroscopic spot image. The catheter was aspirated, flushed with saline, and injected with appropriate volume heparin dwells. The venotomy incision was closed with subcuticular  4-0 Vicryl. Dermabond was applied to the incision. The catheter exit site was secured with 0-Prolene retention sutures. COMPLICATIONS: None.  No pneumothorax. FINDINGS: After catheter placement, the tip lies at the SVC/RA junction. The catheter aspirates normally and is ready for immediate use. IMPRESSION: Placement of tunneled central venous catheter via the right internal jugular vein. The catheter tip lies at the SVC/RA junction. The catheter is ready for immediate use. Electronically Signed   By: Irish Lack M.D.   On: 09/22/2023 17:04   Family Communication: Discussed with patient, he understand and agree. All questions answereed.  Disposition: Status is: Inpatient Remains inpatient appropriate because:  IV diuretics, milrinone, supplemental oxygen  Planned Discharge Destination: Home with Home Health     MDM level 3-patient is short of breath, requiring 4 to 5 L supplemental O2.  Patient is on milrinone drip, high dose IV diuretics and Coumadin.  Patient will need hemodynamic, electrolyte, telemetry close monitoring in the unit.  Patient is at high risk for sudden clinical deterioration.  Author: Marcelino Duster, MD 09/23/2023 4:41 PM Secure chat 7am to 7pm For on call review [email or web address].

## 2023-09-23 NOTE — Progress Notes (Signed)
Patient started coughing up bright red blood- moderate amount-I stopped the Heparin drip Notified Dr. Clide Dales and Dr. Elwyn Lade.  Dr. Elwyn Lade ordered chest xray and to hold aspirin at this time- he stated he would assess patient later to see if Coumadin would need to be held. Patient now feeling much better and symptoms have resolved.

## 2023-09-23 NOTE — TOC Initial Note (Signed)
Transition of Care [facility]) - Initial/Assessment Note    Patient Details  Name: Ryan [name] MRN: [id number] Date of Birth: 09-Apr-1971  [facility]) CM/SW Contact:    Margarito Liner, LCSW Phone Number: 09/23/2023, 4:34 PM  Clinical Narrative:   Readmission prevention screen complete. CSW met with patient. No supports at bedside. CSW introduced role and explained that discharge planning would be discussed. PCP is Duncan Dull, MD. Patient drives himself to appointments. Pharmacy is [facility] in [address]. No issues obtaining medications. Patient lives with his wife and daughter. No home health prior to admission. He is not interested in home health therapy at this time but is aware of potential plan for home milrinone. He is aware that heart failure PA made referral to [facility]. No DME use prior to admission other than nighttime oxygen through [facility]. If he needs continuous oxygen at discharge, will need sats note and DME order. [employer] is also working on getting him a home INR checking device. No further concerns. CSW will continue to follow patient for support and facilitate return home at discharge. His wife and daughter will likely transport him home at discharge.               Expected Discharge Plan: Home w Home Health Services Barriers to Discharge: Continued Medical Work up   Patient Goals and CMS Choice            Expected Discharge Plan and Services     Post Acute Care Choice: Home Health Living arrangements for the past 2 months: Single Family Home                                      Prior Living Arrangements/Services Living arrangements for the past 2 months: Single Family Home Lives with:: Adult Children, Spouse Patient language and need for interpreter reviewed:: Yes Do you feel safe going back to the place where you live?: Yes      Need for Family Participation in Patient Care: Yes (Comment) Care giver support system in place?: Yes  (comment) Current home services: DME Criminal Activity/Legal Involvement Pertinent to Current Situation/Hospitalization: No - Comment as needed  Activities of Daily Living      Permission Sought/Granted                  Emotional Assessment Appearance:: Appears stated age Attitude/Demeanor/Rapport: Engaged, Gracious Affect (typically observed): Accepting, Appropriate, Calm, Pleasant Orientation: : Oriented to Self, Oriented to Place, Oriented to  Time, Oriented to Situation Alcohol / Substance Use: Not Applicable Psych Involvement: No (comment)  Admission diagnosis:  Shortness of breath [R06.02] Unstable angina (HCC) [I20.0] Elevated troponin [R79.89] Subtherapeutic anticoagulation [Z51.81, Z79.01] Chronic diastolic (congestive) heart failure (HCC) [I50.32] Chest pain, unspecified type [R07.9] Cardiogenic shock (HCC) [R57.0] Patient Active Problem List   Diagnosis Date Noted   Subtherapeutic international normalized ratio (INR) 09/20/2023   Cardiogenic shock (HCC) 09/20/2023   Central sleep apnea 08/20/2023   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 08/20/2023   S/P insertion of IVC (inferior vena caval) filter 08/20/2023   Diabetes mellitus treated with injections of non-insulin medication (HCC) 08/20/2023   Right ventricular failure due to disorder of pulmonary circulation (HCC) 08/20/2023   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/20/2023   Paroxysmal atrial fibrillation (HCC) 08/18/2023   Shortness of breath 08/18/2023   Current use of long term anticoagulation 07/17/2023   Pulmonary hypertension, unspecified (  HCC) 06/18/2023   Elevated troponin 06/14/2023   Cor pulmonale (chronic) (HCC) 05/16/2023   Other neutropenia (HCC) 01/19/2023   Anticoagulation goal of INR 2.5 to 3.5 12/24/2022   Rheumatoid factor positive 04/28/2022   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) 02/09/2022   Lymphadenopathy 02/09/2022   At risk for sleep  apnea 01/27/2022   VTE (venous thromboembolism) 01/26/2022   Chronic diastolic CHF (congestive heart failure) (HCC) 01/04/2022   Clotting disorder (HCC) 10/19/2021   Angina at rest [facility]) 07/29/2021   Hyperlipidemia associated with type 2 diabetes mellitus (HCC) 07/29/2021   Atrial fibrillation with rapid ventricular response (HCC) 10/14/2020   Knee pain 03/11/2020   Weakness of both lower limbs 11/17/2017   Type 2 diabetes mellitus with diabetic chronic kidney disease (HCC) 08/03/2017   CKD (chronic kidney disease), stage IIIa 08/03/2017   Chronic pulmonary embolism (HCC) 01/03/2017   Acute diastolic CHF (congestive heart failure) (HCC) 12/15/2016   Essential hypertension 12/15/2016   PVC (premature ventricular contraction) 11/24/2016   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   Hepatic steatosis 05/30/2013   Morbid obesity with BMI of 50.0-59.9, adult (HCC) 05/30/2013   Proteinuria 05/30/2013   PCP:  Sherlene Shams, MD Pharmacy:   [facility] [phone] [place],  - [address] AT [facility] OF [address] [place] [address] [place] [address] Phone: [phone] Fax: [phone]  [facility] [address] [place] [address] Phone: [phone] Fax: [phone]     Social Drivers of Health (SDOH) Social History: SDOH Screenings   Food Insecurity: Patient Declined (09/20/2023)  Housing: Patient Declined (09/20/2023)  Transportation Needs: Patient Declined (09/20/2023)  Utilities: Patient Declined (09/20/2023)  Depression (PHQ2-9): Low Risk  (08/18/2023)  Financial Resource Strain: Low Risk  (08/18/2023)  Physical Activity: Insufficiently Active (08/18/2023)  Social Connections: Moderately Integrated (08/18/2023)  Stress: No Stress Concern Present (08/18/2023)  Tobacco Use: Low Risk  (09/20/2023)   SDOH Interventions:     Readmission Risk Interventions    09/23/2023    4:32 PM  Readmission Risk Prevention  Plan  Transportation Screening Complete  PCP or Specialist Appt within 3-5 Days Complete  HRI or Home Care Consult Complete  Social Work Consult for Recovery Care Planning/Counseling Complete  Palliative Care Screening Not Applicable  Medication Review [employer]) Complete

## 2023-09-23 NOTE — Progress Notes (Signed)
Physical Therapy Treatment Patient Details Name: Ryan [name] MRN: [id number] DOB: 07/24/1971 Today's Date: 09/23/2023   History of Present Illness 53 y.o. male  who presented to the ED with a 2-day history of shortness of breath and sharp chest pain with Ryan inspiration. R cardiac cath yesterday 1/21. 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    PT Comments  Pt is making good progress towards goals with ability to ambulate around RN station. Needs skilled assist for line management and monitoring of O2 sats with exertion. Pt returned on 5L of O2 post gait per RN request. Able to tolerate sitting in recliner this date. Will continue to progress.    If plan is discharge home, recommend the following: Assist for transportation;Help with stairs or ramp for entrance   Can travel by private vehicle        Equipment Recommendations  None recommended by PT    Recommendations for Other Services       Precautions / Restrictions Precautions Precautions: Fall Precaution Comments: mod fall Restrictions Weight Bearing Restrictions Per Provider Order: No     Mobility  Bed Mobility Overal bed mobility: Modified Independent             General bed mobility comments: ease of transition. Once seated at EOB, upright posture noted    Transfers                   General transfer comment: safe technique    Ambulation/Gait Ambulation/Gait assistance: Supervision Gait Distance (Feet): 200 Feet Assistive device: None Gait Pattern/deviations: Step-through pattern, Decreased step length - right, Decreased step length - left, Decreased stride length, Wide base of support       General Gait Details: ambulated on RA with sats decreasing to 83% with exertion. Pt able to self pace and perform pursed lip breathing. Needs assist with line  management   Stairs             Wheelchair Mobility     Tilt Bed    Modified Rankin (Stroke Patients Only)       Balance                                            Cognition Arousal: Alert Behavior During Therapy: WFL for tasks assessed/performed Overall Cognitive Status: Within Functional Limits for tasks assessed                                 General Comments: patient very pleasant and cooperative        Exercises      General Comments General comments (skin integrity, edema, etc.): vss throughout      Pertinent Vitals/Pain Pain Assessment Pain Assessment: No/denies pain    Home Living                          Prior Function            PT Goals (current goals can now be found in the care plan section) Acute Rehab PT Goals Patient Stated Goal: to improve, return home PT Goal Formulation: With patient Time For Goal Achievement: 09/30/23 Potential to Achieve  Goals: Good Progress towards PT goals: Progressing toward goals    Frequency    Min 1X/week      PT Plan      Co-evaluation              AM-PAC PT "6 Clicks" Mobility   Outcome Measure  Help needed turning from your back to your side while in a flat bed without using bedrails?: None Help needed moving from lying on your back to sitting on the side of a flat bed without using bedrails?: None Help needed moving to and from a bed to a chair (including a wheelchair)?: None Help needed standing up from a chair using your arms (e.g., wheelchair or bedside chair)?: None Help needed to walk in hospital room?: A Little Help needed climbing 3-5 steps with a railing? : A Little 6 Click Score: 22    End of Session Equipment Utilized During Treatment: Oxygen Activity Tolerance: Patient tolerated treatment well Patient left: with call bell/phone within reach;in chair Nurse Communication: Mobility status PT Visit Diagnosis: Muscle weakness  (generalized) (M62.81);Difficulty in walking, not elsewhere classified (R26.2)     Time: 4540-9811 PT Time Calculation (min) (ACUTE ONLY): 25 min  Charges:    $Gait Training: 23-37 mins PT General Charges $$ ACUTE PT VISIT: 1 Visit                     Ryan [name], PT, DPT, GCS [phone]    Ryan [name] 09/23/2023, 4:05 PM

## 2023-09-23 NOTE — Progress Notes (Signed)
Advanced Heart Failure Rounding Note  HF Cardiologist: Dr. 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 Resolved during my assessment, otherwise feeling well with improved exercise capacity. His weights have been bed weights due to their not being a scale available in the ICU.    Objective:   Weight Range: (!) 161 kg Body mass index is 55.59 kg/m.   Vital Signs:   Temp:  [97.4 F (36.3 C)-99 F (37.2 C)] 98 F (36.7 C) (01/24 0800) Pulse Rate:  [89-107] 105 (01/24 1000) Resp:  [15-30] 20 (01/24 1000) BP: (106-131)/(63-99) 116/63 (01/24 1000) SpO2:  [86 %-94 %] 93 % (01/24 1000) Last BM Date : 09/22/23  Weight change: Filed Weights   09/20/23 1755 09/21/23 0419 09/22/23 0342  Weight: (!) 165 kg (!) 165 kg (!) 161 kg    Intake/Output:   Intake/Output Summary (Last 24 hours) at 09/23/2023 1303 Last data filed at 09/23/2023 1100 Gross per 24 hour  Intake 1426.25 ml  Output 5575 ml  Net -4148.75 ml      Physical Exam    General:  Well appearing. Sitting up in bed. HEENT: normal Neck: supple. JVP difficult d/t thick neck Cor: Tachycardic, systolic murmur. Lungs: clear Abdomen: obese, soft, nontender, nondistended.  Extremities: no cyanosis, clubbing, rash, 2+ edema, chronic venous stasis changes. RUE PICC Neuro: alert & orientedx3. Affect pleasant  Telemetry   NSR  Labs    CBC Recent Labs    09/22/23 1837 09/23/23 0524  WBC 5.0 5.7  HGB 14.9 14.3  HCT 46.1 43.9  MCV 94.1 94.8  PLT 233 250   Basic Metabolic Panel Recent Labs    14/78/29 0413 09/22/23 1837 09/23/23 0524  NA 135 134* 137  K 3.7 4.2 4.6  CL 104 97* 102  CO2 21* 23 22  GLUCOSE 110* 183* 113*  BUN 23* 22* 23*  CREATININE 1.44* 1.39* 1.56*  CALCIUM 8.4* 9.1 9.0  MG 1.9  --  2.2   Liver Function Tests Recent Labs    09/22/23 1837  AST 18  ALT 19  ALKPHOS 54   BILITOT 1.1  PROT 8.2*  ALBUMIN 3.9   No results for input(s): "LIPASE", "AMYLASE" in the last 72 hours. Cardiac Enzymes No results for input(s): "CKTOTAL", "CKMB", "CKMBINDEX", "TROPONINI" in the last 72 hours.  BNP: BNP (last 3 results) Recent Labs    07/19/23 0310 08/19/23 0213 09/19/23 2216  BNP 298.0* 267.8* 280.4*    ProBNP (last 3 results) No results for input(s): "PROBNP" in the last 8760 hours.   D-Dimer No results for input(s): "DDIMER" in the last 72 hours. Hemoglobin A1C No results for input(s): "HGBA1C" in the last 72 hours. Fasting Lipid Panel No results for input(s): "CHOL", "HDL", "LDLCALC", "TRIG", "CHOLHDL", "LDLDIRECT" in the last 72 hours. Thyroid Function Tests No results for input(s): "TSH", "T4TOTAL", "T3FREE", "THYROIDAB" in the last 72 hours.  Invalid input(s): "FREET3"  Other results:   Imaging    DG Chest Port 1 View Result Date: 09/23/2023 CLINICAL DATA:  [id number] Hemoptysis [id number] EXAM: PORTABLE CHEST 1 VIEW COMPARISON:  CXR 09/19/23 FINDINGS: No pleural effusion. No pneumothorax. Cardiomegaly. No focal airspace opacity. There are prominent bilateral interstitial opacities that could represent pulmonary venous congestion without overt pulmonary edema. No radiographically apparent displaced rib fractures. Visualized upper abdomen is unremarkable. IMPRESSION: Borderline cardiomegaly and likely pulmonary venous congestion. No evidence of overt  pulmonary edema. Electronically Signed   By: Lorenza Cambridge M.D.   On: 09/23/2023 09:58   IR Fluoro Guide CV Line Right Result Date: 09/22/2023 CLINICAL DATA:  Heart failure and need for tunneled central venous catheter for continuous milrinone infusion therapy. EXAM: TUNNELED CENTRAL VENOUS CATHETER PLACEMENT WITH ULTRASOUND AND FLUOROSCOPIC GUIDANCE ANESTHESIA/SEDATION: None MEDICATIONS: None FLUOROSCOPY: 32 seconds.  35.5 mGy. PROCEDURE: The procedure, risks, benefits, and alternatives were explained to the  patient. Questions regarding the procedure were encouraged and answered. The patient understands and consents to the procedure. A timeout was performed prior to initiating the procedure. The right neck and chest were prepped with chlorhexidine in a sterile fashion, and a sterile drape was applied covering the operative field. Maximum barrier sterile technique with sterile gowns and gloves were used for the procedure. Local anesthesia was provided with 1% lidocaine. Ultrasound was used to confirm patency of the right internal jugular vein. Under ultrasound image was saved and recorded. After creating a small venotomy incision, a 21 gauge needle was advanced into the right internal jugular vein under direct, real-time ultrasound guidance. Ultrasound image documentation was performed. After securing guidewire access, a 6 French peel-away sheath was placed. A wire was kinked to measure appropriate catheter length. A 6 [place], dual-lumen power line was chosen for placement. This was tunneled in a retrograde fashion from the chest wall to the venotomy incision. The catheter was cut to 25 cm based on guidewire measurement. The catheter was then placed through the sheath and the sheath removed. Final catheter positioning was confirmed and documented with a fluoroscopic spot image. The catheter was aspirated, flushed with saline, and injected with appropriate volume heparin dwells. The venotomy incision was closed with subcuticular 4-0 Vicryl. Dermabond was applied to the incision. The catheter exit site was secured with 0-Prolene retention sutures. COMPLICATIONS: None.  No pneumothorax. FINDINGS: After catheter placement, the tip lies at the SVC/RA junction. The catheter aspirates normally and is ready for immediate use. IMPRESSION: Placement of tunneled central venous catheter via the right internal jugular vein. The catheter tip lies at the SVC/RA junction. The catheter is ready for immediate use. Electronically Signed    By: Irish Lack M.D.   On: 09/22/2023 17:04      Medications:     Scheduled Medications:  aspirin EC  81 mg Oral Daily   Chlorhexidine Gluconate Cloth  6 each Topical Daily   digoxin  0.0625 mg Oral Daily   sildenafil  20 mg Oral TID   sodium chloride flush  10-40 mL Intracatheter Q12H   spironolactone  25 mg Oral Daily   warfarin  10 mg Oral ONCE-1600   Warfarin - Pharmacist Dosing Inpatient   Does not apply q1600    Infusions:  furosemide Stopped (09/23/23 1610)   heparin Stopped (09/23/23 0806)   milrinone 0.25 mcg/kg/min (09/23/23 1100)    PRN Medications: acetaminophen, albuterol, nitroGLYCERIN, ondansetron (ZOFRAN) IV, mouth rinse, sodium chloride flush    Patient Profile   53 y.o. 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 Thrombectomy would be very high risk with his weight and current decompensation, could consider balloon angioplasty -Continues on 0.25 milrinone with estimated Fick CI of 2.6. May need home milrinone. Have reached out to [facility] home infusion rep, Jeri Modena, RN. -Continue to push diuresis, IV lasix 160 mg BID. Supp K and Mag aggressively. -Continue sildenafil 20 mg TID. Will not titrate further as he will eventually transition to riociguat (approved) -Stopped beta blocker with low-output -Continue spiro 25 mg daily -Continue digoxin 0.0625 mg  daily, dig level 0.3 on 01/23 -Known OSA. Reports he is waiting to receive CPAP.   2. 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 The lower filter appeared occluded on prior angiography with multiple lower venous collaterals. Vascular surgery previously felt he was not a candidate for surgery. Suspect this has contributed to his lower extremity edema, chronic PE and "faliure" of OAC. Difficult situation, event if CTEPH treated he would be at high risk for future clots and ongoing lower extremity swelling. -Chronic, stable PE on CTA this admit.  -On heparin gtt.  -Resumed Warfarin. INR recently supratherapeutic. 1.7 today -Small volume hemoptysis, will resume warfarin tonight   3. Atrial fibrillation/flutter -ECG on presentation with sinus tach -Stopped beta blocker as above -Heparin gtt   4. CKD IIIa -Baseline Scr seems to be 1.4-1.7 -Scr stable at 1.56 today -Support CO with milrinone  Continue to mobilize as tolerated.   Anticipate he will be hospitalized through the weekend with possible discharge early next week.   Tunneled single lumen PICC with IR done 1/23.  Length of Stay: 3  [name], MD  09/23/2023, 1:03 PM  Advanced Heart Failure Team Pager [phone] (M-F; 7a - 5p)  Please contact [facility] for night-coverage after hours (5p -7a ) and weekends on [email or web address]

## 2023-09-24 DIAGNOSIS — R0602 Shortness of breath: Secondary | ICD-10-CM | POA: Diagnosis not present

## 2023-09-24 DIAGNOSIS — I10 Essential (primary) hypertension: Secondary | ICD-10-CM | POA: Diagnosis not present

## 2023-09-24 DIAGNOSIS — R0600 Dyspnea, unspecified: Secondary | ICD-10-CM | POA: Diagnosis not present

## 2023-09-24 DIAGNOSIS — I50810 Right heart failure, unspecified: Secondary | ICD-10-CM

## 2023-09-24 DIAGNOSIS — I5033 Acute on chronic diastolic (congestive) heart failure: Secondary | ICD-10-CM | POA: Diagnosis not present

## 2023-09-24 DIAGNOSIS — R570 Cardiogenic shock: Secondary | ICD-10-CM | POA: Diagnosis not present

## 2023-09-24 DIAGNOSIS — I825Z3 Chronic embolism and thrombosis of unspecified deep veins of distal lower extremity, bilateral: Secondary | ICD-10-CM | POA: Diagnosis not present

## 2023-09-24 LAB — CBC
HCT: 44 % (ref 39.0–52.0)
Hemoglobin: 14.5 g/dL (ref 13.0–17.0)
MCH: 30.5 pg (ref 26.0–34.0)
MCHC: 33 g/dL (ref 30.0–36.0)
MCV: 92.6 fL (ref 80.0–100.0)
Platelets: 219 10*3/uL (ref 150–400)
RBC: 4.75 MIL/uL (ref 4.22–5.81)
RDW: 14.6 % (ref 11.5–15.5)
WBC: 5.3 10*3/uL (ref 4.0–10.5)
nRBC: 0 % (ref 0.0–0.2)

## 2023-09-24 LAB — BASIC METABOLIC PANEL
Anion gap: 13 (ref 5–15)
BUN: 26 mg/dL — ABNORMAL HIGH (ref 6–20)
CO2: 23 mmol/L (ref 22–32)
Calcium: 9.1 mg/dL (ref 8.9–10.3)
Chloride: 96 mmol/L — ABNORMAL LOW (ref 98–111)
Creatinine, Ser: 1.61 mg/dL — ABNORMAL HIGH (ref 0.61–1.24)
GFR, Estimated: 51 mL/min — ABNORMAL LOW (ref >60)
Glucose, Bld: 118 mg/dL — ABNORMAL HIGH (ref 70–99)
Potassium: 4 mmol/L (ref 3.5–5.1)
Sodium: 132 mmol/L — ABNORMAL LOW (ref 135–145)

## 2023-09-24 LAB — MAGNESIUM: Magnesium: 2.3 mg/dL (ref 1.7–2.4)

## 2023-09-24 LAB — COOXEMETRY PANEL
Carboxyhemoglobin: 1.1 % (ref 0.5–1.5)
Methemoglobin: 0.8 % (ref 0.0–1.5)
O2 Saturation: 61.5 %
Total hemoglobin: 15.1 g/dL (ref 12.0–16.0)
Total oxygen content: 60.3 %

## 2023-09-24 LAB — PROTIME-INR
INR: 2 — ABNORMAL HIGH (ref 0.8–1.2)
Prothrombin Time: 22.6 s — ABNORMAL HIGH (ref 11.4–15.2)

## 2023-09-24 LAB — GLUCOSE, CAPILLARY
Glucose-Capillary: 101 mg/dL — ABNORMAL HIGH (ref 70–99)
Glucose-Capillary: 144 mg/dL — ABNORMAL HIGH (ref 70–99)
Glucose-Capillary: 137 mg/dL — ABNORMAL HIGH (ref 70–99)
Glucose-Capillary: 140 mg/dL — ABNORMAL HIGH (ref 70–99)

## 2023-09-24 LAB — HEPARIN LEVEL (UNFRACTIONATED): Heparin Unfractionated: <0.1 [IU]/mL — ABNORMAL LOW (ref 0.30–0.70)

## 2023-09-24 MED ORDER — WARFARIN SODIUM 10 MG PO TABS
10.0000 mg | ORAL_TABLET | Freq: Once | ORAL | Status: AC
  Filled 2023-09-24: qty 1

## 2023-09-24 NOTE — Plan of Care (Signed)
  Problem: Fluid Volume: Goal: Ability to maintain a balanced intake and output will improve Outcome: Progressing   Problem: Nutritional: Goal: Maintenance of adequate nutrition will improve Outcome: Progressing   Problem: Skin Integrity: Goal: Risk for impaired skin integrity will decrease Outcome: Progressing

## 2023-09-24 NOTE — Progress Notes (Signed)
Patient had a few episodes this shift of coughing up bright red blood.  Dr. Clide Dales made aware- ordered to hold aspirin and coumadin.

## 2023-09-24 NOTE — Progress Notes (Signed)
Progress Note   Patient: Ryan [name] HYQ:[id number] DOB: 10/21/1970 DOA: 09/20/2023     4 DOS: the patient was seen and examined on 09/24/2023   Brief hospital course: Ryan [name] is a 53 y.o. 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   Patient is admitted for further management evaluation of acute dyspnea, elevated troponin.   Patient ordered right heart cath which showed severely elevated PA pressures and reduced cardiac index.  Patient started on milrinone, sildenafil therapy.  He is continued on digoxin, spironolactone therapy.  Patient is currently on 4 to 5 L supplemental oxygen.  Assessment and Plan: * Acute on chronic diastolic CHF Acute hypoxic respiratory failure Severe pulmonary hypertension History of prior pulmonary embolism CTA with chronic pulmonary embolism.  Patient follows vascular team and had thrombectomy done last November. Cardiology evaluated the patient recommended right heart cath which showed severely elevated PA pressures and reduced cardiac index.   09/20/23 Patient is started on IV milrinone and sildenafil therapy.   1/23 tunneled right internal jugular CVC placement for milrinone gtt. Heparin held 09/23/23 due to hemoptysis. 09/24/23 Coumadin held due to persistent hemoptysis.  Continued on Lasix 160mg  bid per heart failure team. Continues to be on 3-4 L supplemental oxygen to maintain saturation greater than 90%. Continue digoxin, spironolactone. Hold metoprolol, losartan due to borderline low blood pressures. Heart failure team advises [facility] vascular team intervention upon discharge. Asked vascular team here to evaluate him.  Type  2 diabetes mellitus with diabetic chronic kidney disease (HCC) A1c 6.1. He is eating fair. Hypoglycemia protocol  Essential hypertension BP borderline low. Caution with antihypertensives as blood pressure borderline low.  CKD (chronic kidney disease), stage IIIa Renal function at baseline. Continue to monitor while on diuretics. Avoid other nephrotoxic drugs.  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 Holding Coumadin for now  Morbid obesity with BMI of 50.0-59.9, adult (HCC) Complicating factor to overall prognosis and care. Diet, exercise and weight reduction advised.  Chronic pulmonary embolism (HCC) Causing elevated pulmonary pressures Chronic DVT s/p IVC. Further management as outpatient. He would benefit from thrombectomy at a later time per heart failure team.     Out of bed to chair. Incentive spirometry. Nursing supportive care. Fall, aspiration precautions. DVT prophylaxis   Code Status: Full Code  Subjective: Patient is seen and examined today morning.  He small amount of bleeding while coughing. Heparin drip held yesterday, coumadin held today. He is on 4L supplemental oxygen.  Eating fair, getting out of bed.  Physical Exam: Vitals:   09/24/23 1500 09/24/23 1540 09/24/23 1600 09/24/23 1700  BP: 119/78  (!) 113/57   Pulse: 97 97 100 (!) 110  Resp: 20 19 (!) 22 (!) 21  Temp:      TempSrc:      SpO2: (!) 84% 90% (!) 80% 94%  Weight:      Height:        General - Middle aged morbidly obese African-American male, mild respiratory distress HEENT - PERRLA, EOMI, atraumatic head, non tender sinuses. Lung -distant breath sounds, diffuse rales, rhonchi, no wheezes. Heart - S1,  S2 heard, no murmurs, rubs, 1+ pitting pedal edema. Abdomen - Soft, non tender obese, bowel sounds good Neuro - Alert, awake and oriented x 3, non focal exam. Skin -  Warm and dry.  Data Reviewed:      Latest Ref Rng & Units 09/24/2023    5:05 AM 09/23/2023    5:24 AM 09/22/2023    6:37 PM  CBC  WBC 4.0 - 10.5 K/uL 5.3  5.7  5.0   Hemoglobin 13.0 - 17.0 g/dL 16.1  09.6  04.5   Hematocrit 39.0 - 52.0 % 44.0  43.9  46.1   Platelets 150 - 400 K/uL 219  250  233       Latest Ref Rng & Units 09/24/2023    5:05 AM 09/23/2023    5:24 AM 09/22/2023    6:37 PM  BMP  Glucose 70 - 99 mg/dL 409  811  914   BUN 6 - 20 mg/dL 26  23  22    Creatinine 0.61 - 1.24 mg/dL 7.82  9.56  2.13   Sodium 135 - 145 mmol/L 132  137  134   Potassium 3.5 - 5.1 mmol/L 4.0  4.6  4.2   Chloride 98 - 111 mmol/L 96  102  97   CO2 22 - 32 mmol/L 23  22  23    Calcium 8.9 - 10.3 mg/dL 9.1  9.0  9.1    DG Chest Port 1 View Result Date: 09/23/2023 CLINICAL DATA:  [id number] Hemoptysis [id number] EXAM: PORTABLE CHEST 1 VIEW COMPARISON:  CXR 09/19/23 FINDINGS: No pleural effusion. No pneumothorax. Cardiomegaly. No focal airspace opacity. There are prominent bilateral interstitial opacities that could represent pulmonary venous congestion without overt pulmonary edema. No radiographically apparent displaced rib fractures. Visualized upper abdomen is unremarkable. IMPRESSION: Borderline cardiomegaly and likely pulmonary venous congestion. No evidence of overt pulmonary edema. Electronically Signed   By: Lorenza Cambridge M.D.   On: 09/23/2023 09:58   Family Communication: Discussed with patient, he understand and agree. All questions answereed.  Disposition: Status is: Inpatient Remains inpatient appropriate because: IV diuretics, milrinone, supplemental oxygen  Planned Discharge Destination: Home with Home Health     MDM level 3-patient is short of breath, requiring 4 L supplemental O2.  Patient is on milrinone drip, high dose IV diuretics.  Patient will need hemodynamic, electrolyte, telemetry close monitoring in the unit.  Patient is at high risk for sudden clinical  deterioration.  Author: Marcelino Duster, MD 09/24/2023 5:14 PM Secure chat 7am to 7pm For on call review [email or web address].

## 2023-09-24 NOTE — Plan of Care (Signed)
  Problem: Education: Goal: Ability to describe self-care measures that may prevent or decrease complications (Diabetes Survival Skills Education) will improve Outcome: Progressing   Problem: Fluid Volume: Goal: Ability to maintain a balanced intake and output will improve Outcome: Progressing   Problem: Nutritional: Goal: Maintenance of adequate nutrition will improve Outcome: Progressing Goal: Progress toward achieving an optimal weight will improve Outcome: Progressing   Problem: Skin Integrity: Goal: Risk for impaired skin integrity will decrease Outcome: Progressing   Problem: Education: Goal: Understanding of cardiac disease, CV risk reduction, and recovery process will improve Outcome: Progressing   Problem: Activity: Goal: Ability to tolerate increased activity will improve Outcome: Progressing   Problem: Cardiovascular: Goal: Ability to achieve and maintain adequate cardiovascular perfusion will improve Outcome: Progressing   Problem: Clinical Measurements: Goal: Will remain free from infection Outcome: Progressing Goal: Diagnostic test results will improve Outcome: Progressing Goal: Respiratory complications will improve Outcome: Progressing   Problem: Activity: Goal: Risk for activity intolerance will decrease Outcome: Progressing   Problem: Elimination: Goal: Will not experience complications related to urinary retention Outcome: Progressing

## 2023-09-24 NOTE — Progress Notes (Addendum)
Patient Name: Ryan [name] Date of Encounter: 09/24/2023 [facility] Cardiologist: None   Interval Summary  .    Feeling OK.  Continues to have hemoptysis.  No change in respiratory status.  Feels better when ambulating than in bed.  Denies pleuritic CP.   Vital Signs .    Vitals:   09/24/23 0800 09/24/23 0858 09/24/23 0900 09/24/23 1000  BP: 121/85   106/75  Pulse: 92 (!) 104 (!) 101 (!) 103  Resp: 17 18 20 20   Temp:  97.9 F (36.6 C)    TempSrc:  Axillary    SpO2: 91% 93% 94% 95%  Weight:      Height:        Intake/Output Summary (Last 24 hours) at 09/24/2023 1107 Last data filed at 09/24/2023 0900 Gross per 24 hour  Intake 684.17 ml  Output 2750 ml  Net -2065.83 ml      09/24/2023    5:00 AM 09/23/2023    5:00 AM 09/22/2023    3:42 AM  Last 3 Weights  Weight (lbs) -- -- 354 lb 15.1 oz  Weight (kg) -- -- 161 kg      Telemetry/ECG    Sinus rhythm, sinus tachcyardia.  - Personally Reviewed  Physical Exam .    VS:  BP 106/75   Pulse (!) 103   Temp 97.9 F (36.6 C) (Axillary)   Resp 20   Ht 5\' 7"  (1.702 m)   Wt (!) 161 kg   SpO2 95%   BMI 55.59 kg/m  , BMI Body mass index is 55.59 kg/m. GENERAL:  Well appearing HEENT: Pupils equal round and reactive, fundi not visualized, oral mucosa unremarkable NECK:  No jugular venous distention, waveform within normal limits, carotid upstroke brisk and symmetric, no bruits, no thyromegaly LUNGS:  Clear to auscultation bilaterally HEART:  RRR.  PMI not displaced or sustained,S1 and S2 within normal limits, no S3, no S4, no clicks, no rubs, no murmurs ABD:  Flat, positive bowel sounds normal in frequency in pitch, no bruits, no rebound, no guarding, no midline pulsatile mass, no hepatomegaly, no splenomegaly EXT:  2 plus pulses throughout, no edema, no cyanosis no clubbing SKIN:  No rashes no nodules NEURO:  Cranial nerves II through XII grossly intact, motor grossly intact throughout PSYCH:  Cognitively  intact, oriented to person place and time   Assessment & Plan .     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  Now therapeutic on warfarin so heparin has been discontinued.  WHO Group IV PH.  Doing well on milrinone and digoxin.  He was net -2.4L yesterday and -8.6L this admission.  Renal function stable.   -Continue lasix, milrinone, spironolactone, digoxin, and sildenafil -Continue warfarin. INR goal 2-3. -Per Dr. Elwyn Lade, planning to transition sildenafil to Riociguat - Plan for pulmonary angiography once more medically stable  # Recurrent DVT/PE: Continue warfarin.  Extensive history of thromboembolic disease.  S/p 2 IVC filters which have occluded and subsequently developed lower venous collaterals.  Thought to be poor candidate for surgery.  CT this admission with chronic stable PE.   # Hemoptysis: Small volume hemoptysis persists.  H/ stable.  Continue anticoagulation.    For questions or updates, please contact [facility] Please consult [email or web address] for contact info under    Total critical care time: 45 minutes. Critical care time was exclusive of separately billable procedures and treating other patients.  Critical care was necessary to treat or prevent imminent or life-threatening deterioration. Critical care was time spent personally by me on the following activities: development of treatment plan with patient and/or surrogate as well as nursing, discussions with consultants, evaluation of patient's response to treatment, examination of patient, obtaining history from patient or surrogate, ordering and performing treatments and interventions, ordering and review of laboratory studies, ordering and review of radiographic studies, pulse oximetry and re-evaluation of patient's condition.     Signed, Chilton Si, MD

## 2023-09-24 NOTE — Progress Notes (Signed)
ANTICOAGULATION CONSULT NOTE  Pharmacy Consult for heparin infusion and resume PTA warfarin  Indication: CTEPH / recurrent DVT / PE  Patient Measurements: Height: 5\' 7"  (170.2 cm) Weight:  (unable to weigh bed not working) IBW/kg (Calculated) : 66.1 Heparin Dosing Weight: 104.8 kg  Labs: Recent Labs    09/22/23 0413 09/22/23 1837 09/23/23 0524 09/24/23 0505  HGB 13.5 14.9 14.3 14.5  HCT 40.2 46.1 43.9 44.0  PLT 229 233 250 219  LABPROT 20.4*  --  21.5* 22.6*  INR 1.7*  --  1.8* 2.0*  HEPARINUNFRC 0.43  --  0.30 <0.10*  CREATININE 1.44* 1.39* 1.56* 1.61*    Estimated Creatinine Clearance: 78.1 mL/min (A) (by C-G formula based on SCr of 1.61 mg/dL (H)).   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  Regular the workup has not been forthcoming) previously followed by Dr. Isaiah Serge at [facility]   Hepatic steatosis    Hypertension    Morbid obesity with BMI of 50.0-59.9, adult (HCC)    Oxygen deficiency    Peripheral vascular disease (HCC)    Presence of IVC filter    Has 2 IVC filters with significant thrombus burden superior to the filter.   Pulmonary embolus (HCC) 12/2016   Initially just treated with anticoagulation, but in February 2023 in April 2024 treated with EKOS thrombectomy for submassive PE.   Ventricular trigeminy    Weakness of both lower limbs     Assessment: Pt is a 53 yo male w/ hx of DVT/PE on warfarin (7.5mg  dose PTA, last dose 1/19 @ 2345) presenting to ED c/o SOB x 1.5 days found with elevated troponin I level trending up.  Pt has/had cardiac cath procedure  scheduled for today at 0730. Hx: multiple DVTs/PES dating back to 2s. Per chart review, has MTHFR gene mutation and was previously followed by Hematology (Dr. Isaiah Serge)   Last note from anticoagulation clinic on 09/15/23 noted INR goal 2.5-3 given recurrent thromboembolic events with INR < 2. Patient with supratherapeutic INRs on 1/16 and 1/17 so dose was decreased from 7.5 mg daily to 5 mg daily and patient was instructed to hold warfarin until 1/19.  HL: 1/21 1430 HL= 0.10  SUBtherapeutic (drawn ~ 4 hours after heparin drip restart from procedure) 1/21 2244 HL 0.21, subtherapeutic 1/22 0430 HL 0.47, therapeutic x 1 1/22 1121 HL 0.41, therapeutic x 2 1/23 0413 HL 0.43, therapeutic x 3 1/24 0524 HL 0.30, therapeutic x 4  INR: 1/20 INR 1.3 (per med rec pt took 7.5 mg dose PTA) 1/21 no INR  resume warfarin 7.5mg  x 1 , check INR in am 1/22 INR 1.5 1/23 INR 1.7 1/24 INR 1.8 1/25 INR 2.0  Goal of Therapy:  Heparin level 0.3-0.7 units/ml INR 2.5 - 3 Monitor platelets by anticoagulation protocol: Yes   Plan:  Heparin: --Continue heparin infusion at 1750 units/hr --Will recheck HL tomorrow AM --Will bridge with heparin drip until INR therapeutic with warfarin --CBC daily while on heparin  Warfarin: --Warfarin level is LOW and suspect this is due to held doses (  likely 1/16 - 1/18) given supratherapeutic INRs in outpatient setting --Will give warfarin 10 mg PO x 1 again tonight --INR daily and CBC per protocol  [facility] A Aneesah Hernan 09/24/2023 8:39 AM

## 2023-09-25 ENCOUNTER — Inpatient Hospital Stay: Payer: No Typology Code available for payment source

## 2023-09-25 DIAGNOSIS — I5020 Unspecified systolic (congestive) heart failure: Secondary | ICD-10-CM

## 2023-09-25 DIAGNOSIS — I5032 Chronic diastolic (congestive) heart failure: Secondary | ICD-10-CM | POA: Diagnosis not present

## 2023-09-25 DIAGNOSIS — I50810 Right heart failure, unspecified: Secondary | ICD-10-CM | POA: Diagnosis not present

## 2023-09-25 DIAGNOSIS — I10 Essential (primary) hypertension: Secondary | ICD-10-CM | POA: Diagnosis not present

## 2023-09-25 DIAGNOSIS — I825Z3 Chronic embolism and thrombosis of unspecified deep veins of distal lower extremity, bilateral: Secondary | ICD-10-CM | POA: Diagnosis not present

## 2023-09-25 DIAGNOSIS — R0600 Dyspnea, unspecified: Secondary | ICD-10-CM | POA: Diagnosis not present

## 2023-09-25 DIAGNOSIS — I5033 Acute on chronic diastolic (congestive) heart failure: Secondary | ICD-10-CM | POA: Diagnosis not present

## 2023-09-25 DIAGNOSIS — R0602 Shortness of breath: Secondary | ICD-10-CM | POA: Diagnosis not present

## 2023-09-25 LAB — BASIC METABOLIC PANEL
Anion gap: 12 (ref 5–15)
BUN: 34 mg/dL — ABNORMAL HIGH (ref 6–20)
CO2: 24 mmol/L (ref 22–32)
Calcium: 8.9 mg/dL (ref 8.9–10.3)
Chloride: 97 mmol/L — ABNORMAL LOW (ref 98–111)
Creatinine, Ser: 1.76 mg/dL — ABNORMAL HIGH (ref 0.61–1.24)
GFR, Estimated: 46 mL/min — ABNORMAL LOW (ref >60)
Glucose, Bld: 126 mg/dL — ABNORMAL HIGH (ref 70–99)
Potassium: 3.9 mmol/L (ref 3.5–5.1)
Sodium: 133 mmol/L — ABNORMAL LOW (ref 135–145)

## 2023-09-25 LAB — CBC
HCT: 44.5 % (ref 39.0–52.0)
Hemoglobin: 14.6 g/dL (ref 13.0–17.0)
MCH: 30.6 pg (ref 26.0–34.0)
MCHC: 32.8 g/dL (ref 30.0–36.0)
MCV: 93.3 fL (ref 80.0–100.0)
Platelets: 226 10*3/uL (ref 150–400)
RBC: 4.77 MIL/uL (ref 4.22–5.81)
RDW: 14.6 % (ref 11.5–15.5)
WBC: 4.8 10*3/uL (ref 4.0–10.5)
nRBC: 0 % (ref 0.0–0.2)

## 2023-09-25 LAB — GLUCOSE, CAPILLARY
Glucose-Capillary: 114 mg/dL — ABNORMAL HIGH (ref 70–99)
Glucose-Capillary: 143 mg/dL — ABNORMAL HIGH (ref 70–99)
Glucose-Capillary: 128 mg/dL — ABNORMAL HIGH (ref 70–99)
Glucose-Capillary: 188 mg/dL — ABNORMAL HIGH (ref 70–99)

## 2023-09-25 LAB — MAGNESIUM: Magnesium: 2.3 mg/dL (ref 1.7–2.4)

## 2023-09-25 LAB — PROTIME-INR
INR: 2.1 — ABNORMAL HIGH (ref 0.8–1.2)
Prothrombin Time: 23.6 s — ABNORMAL HIGH (ref 11.4–15.2)

## 2023-09-25 MED ORDER — GUAIFENESIN-DM 100-10 MG/5ML PO SYRP
5.0000 mL | ORAL_SOLUTION | ORAL | Status: DC | PRN
  Administered 2023-09-25 – 2023-09-29 (×5): 5 mL via ORAL
  Filled 2023-09-25 (×3): qty 10
  Filled 2023-09-25: qty 5
  Filled 2023-09-25: qty 10

## 2023-09-25 MED ORDER — IPRATROPIUM-ALBUTEROL 0.5-2.5 (3) MG/3ML IN SOLN
3.0000 mL | RESPIRATORY_TRACT | Status: DC
  Administered 2023-09-25: 3 mL via RESPIRATORY_TRACT

## 2023-09-25 MED ORDER — IPRATROPIUM-ALBUTEROL 0.5-2.5 (3) MG/3ML IN SOLN: 3.0000 mL | RESPIRATORY_TRACT | Status: DC | PRN

## 2023-09-25 MED ORDER — SILDENAFIL CITRATE 20 MG PO TABS
10.0000 mg | ORAL_TABLET | Freq: Three times a day (TID) | ORAL | Status: DC
Start: 2023-09-25 — End: 2023-09-30
  Administered 2023-09-25 – 2023-09-29 (×14): 10 mg via ORAL
  Filled 2023-09-25 (×17): qty 1

## 2023-09-25 MED ORDER — BUDESONIDE 0.5 MG/2ML IN SUSP: 0.5000 mg | Freq: Two times a day (BID) | RESPIRATORY_TRACT | Status: DC

## 2023-09-25 MED ORDER — METHYLPREDNISOLONE SODIUM SUCC 40 MG IJ SOLR
40.0000 mg | Freq: Two times a day (BID) | INTRAMUSCULAR | Status: DC
  Administered 2023-09-25 – 2023-09-28 (×7): 40 mg via INTRAVENOUS
  Filled 2023-09-25 (×6): qty 1

## 2023-09-25 NOTE — Progress Notes (Signed)
ANTICOAGULATION CONSULT NOTE  Pharmacy Consult for heparin infusion and resume PTA warfarin  Indication: CTEPH / recurrent DVT / PE  Patient Measurements: Height: 5\' 7"  (170.2 cm) Weight:  (unable to take weight/ bed error) IBW/kg (Calculated) : 66.1 Heparin Dosing Weight: 104.8 kg  Labs: Recent Labs    09/23/23 0524 09/24/23 0505 09/25/23 0428  HGB 14.3 14.5 14.6  HCT 43.9 44.0 44.5  PLT 250 219 226  LABPROT 21.5* 22.6* 23.6*  INR 1.8* 2.0* 2.1*  HEPARINUNFRC 0.30 <0.10*  --   CREATININE 1.56* 1.61* 1.76*    Estimated Creatinine Clearance: 71.5 mL/min (A) (by C-G formula based on SCr of 1.76 mg/dL (H)).   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  Regular the workup has not been forthcoming) previously followed by Dr. Isaiah Serge at [facility]   Hepatic steatosis    Hypertension    Morbid obesity with BMI of 50.0-59.9, adult (HCC)    Oxygen deficiency    Peripheral vascular disease (HCC)    Presence of IVC filter    Has 2 IVC filters with significant thrombus burden superior to the filter.   Pulmonary embolus (HCC) 12/2016   Initially just treated with anticoagulation, but in February 2023 in April 2024 treated with EKOS thrombectomy for submassive PE.   Ventricular trigeminy    Weakness of both lower limbs     Assessment: Pt is a 53 yo male w/ hx of DVT/PE on warfarin (7.5mg  dose PTA, last dose 1/19 @ 2345) presenting to ED c/o SOB x 1.5 days found with elevated troponin I level trending up.  Pt has/had cardiac cath procedure scheduled for today at 0730. Hx: multiple DVTs/PES  dating back to 65s. Per chart review, has MTHFR gene mutation and was previously followed by Hematology (Dr. Isaiah Serge)   Last note from anticoagulation clinic on 09/15/23 noted INR goal 2.5-3 given recurrent thromboembolic events with INR < 2. Patient with supratherapeutic INRs on 1/16 and 1/17 so dose was decreased from 7.5 mg daily to 5 mg daily and patient was instructed to hold warfarin until 1/19.  HL: 1/21 1430 HL= 0.10  SUBtherapeutic (drawn ~ 4 hours after heparin drip restart from procedure) 1/21 2244 HL 0.21, subtherapeutic 1/22 0430 HL 0.47, therapeutic x 1 1/22 1121 HL 0.41, therapeutic x 2 1/23 0413 HL 0.43, therapeutic x 3 1/24 0524 HL 0.30, therapeutic x 4  INR: 1/20 INR 1.3 (per med rec pt took 7.5 mg dose PTA) 1/21 no INR  resume warfarin 7.5mg  x 1 , check INR in am 1/22 INR 1.5 1/23 INR 1.7 1/24 INR 1.8 1/25 INR 2.0 1/26 INR 2.1  Goal of Therapy:  Heparin level 0.3-0.7 units/ml INR 2.5 - 3 Monitor platelets by anticoagulation protocol: Yes   Plan:  Heparin: --Patient now trending towards therapeutic goal --Per cardiology, can discontinue heparin bridge  Warfarin: --INR trending nicely towards therapeutic goal --Patient with hemoptysis, holding dose for today(dose was held yesterday) --Will assess daily --INR daily and CBC per protocol  [facility] A Ayliana Casciano 09/25/2023 8:17 AM

## 2023-09-25 NOTE — Progress Notes (Signed)
Progress Note   Patient: Ryan [name] VZD:[id number] DOB: 02-28-1971 DOA: 09/20/2023     5 DOS: the patient was seen and examined on 09/25/2023   Brief hospital course: Ryan [name] is a 53 y.o. 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   Patient is admitted for further management evaluation of acute dyspnea, elevated troponin.   Patient ordered right heart cath which showed severely elevated PA pressures and reduced cardiac index.  Patient started on milrinone, sildenafil therapy.  He is continued on digoxin, spironolactone therapy.  Patient is currently on 4 L supplemental oxygen.  Assessment and Plan: * Acute on chronic diastolic CHF Acute hypoxic respiratory failure Severe pulmonary hypertension History of prior pulmonary embolism CTA with chronic pulmonary embolism.  Patient follows vascular team and had thrombectomy done last November. Cardiology evaluated the patient recommended right heart cath which showed severely elevated PA pressures and reduced cardiac index.   09/20/23 Patient is started on IV milrinone and sildenafil therapy.   1/23 tunneled right internal jugular CVC placement for milrinone gtt. Heparin held 09/23/23 due to hemoptysis. 09/24/23 Coumadin held due to persistent hemoptysis, around blood in cannister since night noted. INR 2.1 today, Hb stable. Advised RN to call for severe hemoptysis Continued on Lasix 160mg  bid per heart failure team. Continues to be on 4 L supplemental oxygen to maintain saturation greater than 90%. Continue digoxin, spironolactone. Hold metoprolol, losartan due to borderline low blood pressures. Heart failure  team advises [facility] vascular team intervention upon discharge. Asked vascular team here to evaluate him.  Type 2 diabetes mellitus with diabetic chronic kidney disease (HCC) A1c 6.1. He is eating fair. Hypoglycemia protocol  Essential hypertension BP borderline low. Caution with antihypertensives as blood pressure borderline low.  CKD (chronic kidney disease), stage IIIa Renal function at baseline. Continue to monitor while on diuretics. Avoid other nephrotoxic drugs.  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 Holding Coumadin for now  Morbid obesity with BMI of 50.0-59.9, adult (HCC) Complicating factor to overall prognosis and care. Diet, exercise and weight reduction advised.  Chronic pulmonary embolism (HCC) Causing elevated pulmonary pressures Chronic DVT s/p IVC. Further management as outpatient. He would benefit from thrombectomy at a later time per heart failure team.     Out of bed to chair. Incentive spirometry. Nursing supportive care. Fall, aspiration precautions. DVT prophylaxis   Code Status: Full Code  Subjective: Patient is seen and examined today morning.  Had almost dark blood in cannister since last night, coumadin held yesterday. He is on 4L supplemental oxygen.  Eating fair, encouraged out of bed, incentive spirometry.  Physical Exam: Vitals:   09/25/23 0900 09/25/23 1000 09/25/23 1100 09/25/23 1200  BP: 106/70 117/72 102/75 (!) 109/47  Pulse: 94 91 95 92  Resp: (!) 25 18 (!) 29 (!) 30  Temp:      TempSrc:      SpO2: 95% 92% 91% 90%  Weight:      Height:        General - Middle aged morbidly obese African-American male, mild respiratory distress HEENT - PERRLA, EOMI, atraumatic  head, non tender sinuses. Lung -distant breath sounds, diffuse rales, rhonchi, no wheezes. Heart - S1, S2 heard, no murmurs, rubs, 1+ pitting  pedal edema. Abdomen - Soft, non tender obese, bowel sounds good Neuro - Alert, awake and oriented x 3, non focal exam. Skin - Warm and dry.  Data Reviewed:      Latest Ref Rng & Units 09/25/2023    4:28 AM 09/24/2023    5:05 AM 09/23/2023    5:24 AM  CBC  WBC 4.0 - 10.5 K/uL 4.8  5.3  5.7   Hemoglobin 13.0 - 17.0 g/dL 16.1  09.6  04.5   Hematocrit 39.0 - 52.0 % 44.5  44.0  43.9   Platelets 150 - 400 K/uL 226  219  250       Latest Ref Rng & Units 09/25/2023    4:28 AM 09/24/2023    5:05 AM 09/23/2023    5:24 AM  BMP  Glucose 70 - 99 mg/dL 409  811  914   BUN 6 - 20 mg/dL 34  26  23   Creatinine 0.61 - 1.24 mg/dL 7.82  9.56  2.13   Sodium 135 - 145 mmol/L 133  132  137   Potassium 3.5 - 5.1 mmol/L 3.9  4.0  4.6   Chloride 98 - 111 mmol/L 97  96  102   CO2 22 - 32 mmol/L 24  23  22    Calcium 8.9 - 10.3 mg/dL 8.9  9.1  9.0    No results found.  Family Communication: Discussed with patient, he understand and agree. All questions answereed.  Disposition: Status is: Inpatient Remains inpatient appropriate because: IV diuretics, milrinone, supplemental oxygen, has hemoptysis.  Planned Discharge Destination: Home with Home Health     MDM level 3-patient is short of breath, has hemoptysis, requiring 4 L supplemental O2.  Patient is on milrinone drip, high dose IV diuretics.  Patient will need hemodynamic, electrolyte, telemetry close monitoring in the unit.  Patient is at high risk for sudden clinical deterioration.  Author: Marcelino Duster, MD 09/25/2023 2:07 PM Secure chat 7am to 7pm For on call review [email or web address].

## 2023-09-25 NOTE — Plan of Care (Signed)
I was notified of hemoptysis Patient has no fevers, no chills, no signs of wheezing  Patient with advanced HF, avoid anti-PLT agents at this time No indication for bronch  Start Steroids for bronchitis, start NEBS

## 2023-09-25 NOTE — Progress Notes (Signed)
Patient Name: Ryan [name] Date of Encounter: 09/25/2023 [facility] Cardiologist: None   Interval Summary  .    Feeling OK.  Continues to have hemoptysis.  Notes that the only new medication is sildenafil and wonders if this is connected.   Vital Signs .    Vitals:   09/25/23 0900 09/25/23 1000 09/25/23 1100 09/25/23 1200  BP: 106/70 117/72 102/75 (!) 109/47  Pulse: 94 91 95 92  Resp: (!) 25 18 (!) 29 (!) 30  Temp:      TempSrc:      SpO2: 95% 92% 91% 90%  Weight:      Height:        Intake/Output Summary (Last 24 hours) at 09/25/2023 1323 Last data filed at 09/25/2023 1200 Gross per 24 hour  Intake 560.38 ml  Output 2150 ml  Net -1589.62 ml      09/25/2023    4:24 AM 09/24/2023    5:00 AM 09/23/2023    5:00 AM  Last 3 Weights  Weight (lbs) -- -- --  Weight (kg) -- -- --      Telemetry/ECG    Sinus rhythm, sinus tachcyardia.  - Personally Reviewed  Physical Exam .    VS:  BP (!) 109/47   Pulse 92   Temp 98.4 F (36.9 C)   Resp (!) 30   Ht 5\' 7"  (1.702 m)   Wt (!) 161 kg   SpO2 90%   BMI 55.59 kg/m  , BMI Body mass index is 55.59 kg/m. GENERAL:  Well appearing HEENT: Pupils equal round and reactive, fundi not visualized, oral mucosa unremarkable NECK:  No jugular venous distention, waveform within normal limits, carotid upstroke brisk and symmetric, no bruits, no thyromegaly LUNGS:  Clear to auscultation bilaterally HEART:  RRR.  PMI not displaced or sustained,S1 and S2 within normal limits, no S3, no S4, no clicks, no rubs, no murmurs ABD:  Flat, positive bowel sounds normal in frequency in pitch, no bruits, no rebound, no guarding, no midline pulsatile mass, no hepatomegaly, no splenomegaly EXT:  2 plus pulses throughout, woody edema, no cyanosis no clubbing SKIN:  No rashes no nodules NEURO:  Cranial nerves II through XII grossly intact, motor grossly intact throughout PSYCH:  Cognitively intact, oriented to person place and  time   Assessment & Plan .     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  Now therapeutic on warfarin so heparin has been discontinued.  WHO Group IV PH.  Doing well on milrinone and digoxin.  He was net -2.4L yesterday and -11.5L this admission.  Renal function starting to climb and CVP 1-3.  Will stop IV lasix.  Likely transition to torsemide tomorrow.  He notes that he was not taking the torsemide every day at home because he was worried about developing renal failure and requiring dialysis again.  Of note, sildenafil is sometimes associated with pulmonary hemorrhage.  Will try to reduce the dose to 10 mg and see if that helps. -Continue lasix, milrinone, spironolactone, digoxin, and reduce sildenafil -Continue warfarin. INR goal 2-3. -Per Dr. Elwyn Lade, planning to transition sildenafil to Riociguat - Plan for pulmonary angiography once more medically stable  # Recurrent DVT/PE: Continue warfarin.  Extensive history of thromboembolic disease.  S/p 2 IVC filters which have occluded and subsequently developed lower venous collaterals.  Thought to be poor candidate for surgery.  CT this admission with chronic  stable PE.   # Hemoptysis: Small volume hemoptysis persists.  H/ stable.  Continue anticoagulation.  Reduce sildenafil as above.   For questions or updates, please contact [facility] Please consult [email or web address] for contact info under    Total critical care time: 40 minutes. Critical care time was exclusive of separately billable procedures and treating other patients. Critical care was necessary to treat or prevent imminent or life-threatening deterioration. Critical care was time spent personally by me on the following activities: development of treatment plan with patient and/or surrogate as well as nursing, discussions with consultants, evaluation of patient's response to treatment, examination of patient,  obtaining history from patient or surrogate, ordering and performing treatments and interventions, ordering and review of laboratory studies, ordering and review of radiographic studies, pulse oximetry and re-evaluation of patient's condition.     Signed, Chilton Si, MD

## 2023-09-26 ENCOUNTER — Inpatient Hospital Stay: Payer: No Typology Code available for payment source

## 2023-09-26 DIAGNOSIS — R042 Hemoptysis: Secondary | ICD-10-CM

## 2023-09-26 DIAGNOSIS — R0600 Dyspnea, unspecified: Secondary | ICD-10-CM | POA: Diagnosis not present

## 2023-09-26 DIAGNOSIS — I825Z3 Chronic embolism and thrombosis of unspecified deep veins of distal lower extremity, bilateral: Secondary | ICD-10-CM | POA: Diagnosis not present

## 2023-09-26 DIAGNOSIS — I50810 Right heart failure, unspecified: Secondary | ICD-10-CM | POA: Diagnosis not present

## 2023-09-26 DIAGNOSIS — I5033 Acute on chronic diastolic (congestive) heart failure: Secondary | ICD-10-CM | POA: Diagnosis not present

## 2023-09-26 DIAGNOSIS — I10 Essential (primary) hypertension: Secondary | ICD-10-CM | POA: Diagnosis not present

## 2023-09-26 LAB — CBC
HCT: 45.4 % (ref 39.0–52.0)
Hemoglobin: 14.8 g/dL (ref 13.0–17.0)
MCH: 30.5 pg (ref 26.0–34.0)
MCHC: 32.6 g/dL (ref 30.0–36.0)
MCV: 93.4 fL (ref 80.0–100.0)
Platelets: 265 10*3/uL (ref 150–400)
RBC: 4.86 MIL/uL (ref 4.22–5.81)
RDW: 14.2 % (ref 11.5–15.5)
WBC: 7.9 10*3/uL (ref 4.0–10.5)
nRBC: 0 % (ref 0.0–0.2)

## 2023-09-26 LAB — GLUCOSE, CAPILLARY
Glucose-Capillary: 162 mg/dL — ABNORMAL HIGH (ref 70–99)
Glucose-Capillary: 191 mg/dL — ABNORMAL HIGH (ref 70–99)
Glucose-Capillary: 216 mg/dL — ABNORMAL HIGH (ref 70–99)

## 2023-09-26 LAB — BASIC METABOLIC PANEL
Anion gap: 12 (ref 5–15)
BUN: 45 mg/dL — ABNORMAL HIGH (ref 6–20)
CO2: 23 mmol/L (ref 22–32)
Calcium: 9.4 mg/dL (ref 8.9–10.3)
Chloride: 96 mmol/L — ABNORMAL LOW (ref 98–111)
Creatinine, Ser: 1.98 mg/dL — ABNORMAL HIGH (ref 0.61–1.24)
GFR, Estimated: 40 mL/min — ABNORMAL LOW (ref >60)
Glucose, Bld: 154 mg/dL — ABNORMAL HIGH (ref 70–99)
Potassium: 4.6 mmol/L (ref 3.5–5.1)
Sodium: 131 mmol/L — ABNORMAL LOW (ref 135–145)

## 2023-09-26 LAB — COOXEMETRY PANEL
Carboxyhemoglobin: 1.3 % (ref 0.5–1.5)
Methemoglobin: <0.7 % (ref 0.0–1.5)
O2 Saturation: 63.5 %
Total hemoglobin: 15.3 g/dL (ref 12.0–16.0)
Total oxygen content: 62.7 %

## 2023-09-26 LAB — PROTIME-INR
INR: 1.8 — ABNORMAL HIGH (ref 0.8–1.2)
Prothrombin Time: 21.1 s — ABNORMAL HIGH (ref 11.4–15.2)

## 2023-09-26 MED ORDER — WARFARIN SODIUM 10 MG PO TABS
10.0000 mg | ORAL_TABLET | Freq: Once | ORAL | Status: AC
  Administered 2023-09-26: 10 mg via ORAL
  Filled 2023-09-26: qty 1

## 2023-09-26 NOTE — Plan of Care (Signed)
Problem: Education: Goal: Ability to describe self-care measures that may prevent or decrease complications (Diabetes Survival Skills Education) will improve Outcome: Not Progressing   Problem: Coping: Goal: Ability to adjust to condition or change in health will improve Outcome: Progressing   Problem: Fluid Volume: Goal: Ability to maintain a balanced intake and output will improve Outcome: Progressing   Problem: Health Behavior/Discharge Planning: Goal: Ability to identify and utilize available resources and services will improve Outcome: Not Progressing Goal: Ability to manage health-related needs will improve Outcome: Not Progressing

## 2023-09-26 NOTE — Progress Notes (Signed)
Advanced Heart Failure Rounding Note  HF Cardiologist: Dr. 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 Co-x 64%  Off diuretics, (IV Lasix stopped 1/26). CVPs remain low ~3. SCr 1.6>>1.8>>2.0 today.   C/w high O2 requirements, currently on 10L/min, O2 sats marginal in low 90s and at times dropping into upper 80s despite support.   C/w some hemoptysis though improved w/ reduction of sildenafil down to 10 mg tid. Hgb stable, 14.8. INR subtherapeutic at 1.8 (not on heparin gtt).  Subjectively, he feels slight better today. He feels dyspnea has improves some. Less hemoptysis. No pleuritic chest pain.    Objective:   Weight Range: (!) 158 kg Body mass index is 54.56 kg/m.   Vital Signs:   Temp:  [98.4 F (36.9 C)-98.7 F (37.1 C)] 98.7 F (37.1 C) (01/26 1949) Pulse Rate:  [82-108] 98 (01/27 0600) Resp:  [13-30] 21 (01/27 0600) BP: (92-126)/(47-79) 92/72 (01/27 0600) SpO2:  [80 %-95 %] 88 % (01/27 0600) FiO2 (%):  [45 %] 45 % (01/26 2350) Weight:  [158 kg] 158 kg (01/27 0500) Last BM Date : 09/24/23  Weight change: Filed Weights   09/26/23 0500  Weight: (!) 158 kg    Intake/Output:   Intake/Output Summary (Last 24 hours) at 09/26/2023 0753 Last data filed at 09/26/2023 0700 Gross per 24 hour  Intake 934.57 ml  Output 1875 ml  Net -940.43 ml      Physical Exam    CVP 3 General:  supper morbidly obese, on HFNC but no increased WOB  HEENT: normal Neck: supple. Thick neck, JVD not elevated. Carotids 2+ bilat; no bruits. No lymphadenopathy or thyromegaly appreciated. Cor: PMI nondisplaced. Regular rhythm and tachy rate. No rubs, gallops or murmurs. Tunneled PICC rt upper chest Lungs: decreased BS at the bases, no wheezing  Abdomen: obese, soft, nontender, nondistended. No hepatosplenomegaly. No bruits or masses. Good bowel sounds. Extremities: no cyanosis, clubbing, rash, obese extremities w/  trace b/l Nygren and chronic venous stasis dermatis  Neuro: alert & oriented x 3, cranial nerves grossly intact. moves all 4 extremities w/o difficulty. Affect pleasant.   Telemetry   Sinus tach 110s, personally reviewed   Labs    CBC Recent Labs    09/25/23 0428 09/26/23 0510  WBC 4.8 7.9  HGB 14.6 14.8  HCT 44.5 45.4  MCV 93.3 93.4  PLT 226 265   Basic Metabolic Panel Recent Labs    16/10/96 0505 09/25/23 0428 09/26/23 0510  NA 132* 133* 131*  K 4.0 3.9 4.6  CL 96* 97* 96*  CO2 23 24 23   GLUCOSE 118* 126* 154*  BUN 26* 34* 45*  CREATININE 1.61* 1.76* 1.98*  CALCIUM 9.1 8.9 9.4  MG 2.3 2.3  --    Liver Function Tests No results for input(s): "AST", "ALT", "ALKPHOS", "BILITOT", "PROT", "ALBUMIN" in the last 72 hours.  No results for input(s): "LIPASE", "AMYLASE" in the last 72 hours. Cardiac Enzymes No results for input(s): "CKTOTAL", "CKMB", "CKMBINDEX", "TROPONINI" in the last 72 hours.  BNP: BNP (last 3 results) Recent Labs    07/19/23 0310 08/19/23 0213 09/19/23 2216  BNP 298.0* 267.8* 280.4*    ProBNP (last 3 results) No results for input(s): "PROBNP" in the last 8760 hours.   D-Dimer No results for input(s): "DDIMER" in the last 72 hours. Hemoglobin A1C No results for input(s): "HGBA1C" in the last 72 hours. Fasting Lipid Panel No results for  input(s): "CHOL", "HDL", "LDLCALC", "TRIG", "CHOLHDL", "LDLDIRECT" in the last 72 hours. Thyroid Function Tests No results for input(s): "TSH", "T4TOTAL", "T3FREE", "THYROIDAB" in the last 72 hours.  Invalid input(s): "FREET3"  Other results:   Imaging    DG Chest Port 1 View Result Date: 09/25/2023 CLINICAL DATA:  Hemoptysis. EXAM: PORTABLE CHEST 1 VIEW COMPARISON:  Radiograph 2 days ago 09/23/2023, most recent chest CT 09/20/2023 FINDINGS: Stable right-sided central line. The heart is normal in size. No focal airspace disease, pleural effusion, pulmonary edema or pneumothorax. IMPRESSION: No acute  chest findings. Stable radiographic appearance of the chest. Electronically Signed   By: Narda Rutherford M.D.   On: 09/25/2023 16:47      Medications:     Scheduled Medications:  Chlorhexidine Gluconate Cloth  6 each Topical Daily   digoxin  0.0625 mg Oral Daily   methylPREDNISolone (SOLU-MEDROL) injection  40 mg Intravenous Q12H   sildenafil  10 mg Oral TID   sodium chloride flush  10-40 mL Intracatheter Q12H   spironolactone  25 mg Oral Daily   Warfarin - Pharmacist Dosing Inpatient   Does not apply q1600    Infusions:  milrinone 0.25 mcg/kg/min (09/26/23 0619)    PRN Medications: acetaminophen, guaiFENesin-dextromethorphan, ipratropium-albuterol, nitroGLYCERIN, ondansetron (ZOFRAN) IV, mouth rinse, sodium chloride flush    Patient Profile   53 y.o. 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 Thrombectomy would be very high risk with his weight and current decompensation, could consider balloon angioplasty -Continues on 0.25 milrinone, Co-ox 64%, calculated FICK CI 2.23. CVP remains low ~3, now off IV diuretics, though w/ continued high O2 requirements, currently on 10L [place] w/ marginal O2 sats in the low 90s and at times dropping into the upper 80s  - Will obtain repeat CXR to r/o pulmonary edema. If suggestive of pulmonary edema, will restart diuretics. - Continue Sildenafil 10 mg  tid (reduced dose given hemoptysis, increased risk of pulmonary hemorrhage w/ this agent) - Working to get on riociguat (PharmD assisting)  - continue a/c w/ warfarin, INR 1.8 today  - if continued hypoxia/inability to wean down O2 requirements, will plan for repeat RHC today and placement of PA catheter for continuous hemodynamic monitoring to guide therapies  -He will need home milrinone. Have reached out to [employer] home infusion rep, Jeri Modena RN following  -Off beta blocker with low-output -Continue spiro 25 mg daily -Continue digoxin 0.0625 mg daily, dig level 0.3 on 01/23. Watch SCr closely  -Known OSA. Reports he is waiting to receive CPAP.   2. 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 The lower filter appeared occluded on prior angiography with multiple lower venous collaterals. Vascular surgery previously felt he was not a candidate for surgery. Suspect this has contributed to his lower extremity edema, chronic PE and "faliure" of OAC. Difficult situation, even if CTEPH treated he would  be at high risk for future clots and ongoing lower extremity swelling. -Chronic, stable PE on CTA this admit.  -Resumed Warfarin. INR recently supratherapeutic. 1.8 today. ? Restarting heparin gtt to bridge until therapeutic    3. Atrial fibrillation/flutter -ECG on presentation with sinus tach, continues in ST today  -Stopped beta blocker as above -on Warfarin    4. CKD IIIa -Baseline Scr seems to be 1.4-1.7 -Scr 1.98 today  -Support CO with milrinone -Holding diuretics for now w/ low CVPs     Tunneled single lumen PICC with IR done 1/23.  Length of Stay: [address], [place]  09/26/2023, 7:53 AM  Advanced Heart Failure Team Pager [phone] (M-F; 7a - 5p)  Please contact [facility] for night-coverage after hours (5p -7a ) and weekends on [email or web address]

## 2023-09-26 NOTE — Progress Notes (Signed)
Progress Note   Patient: Ryan [name] WGN:[id number] DOB: Nov 06, 1970 DOA: 09/20/2023     6 DOS: the patient was seen and examined on 09/26/2023   Brief hospital course: Ryan [name] is a 53 y.o. 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   Patient is admitted for further management evaluation of acute dyspnea, elevated troponin.  He is evaluated by heart failure team for severe right heart failure, pulmonary hypertension. Patient got right heart cath which showed severely elevated PA pressures and reduced cardiac index.  Patient is started on milrinone gtt., sildenafil therapy.  He is continued on digoxin, spironolactone therapy.  Patient is initially was on 4 L supplemental oxygen, later developed significant hemoptysis has heparin, Coumadin held.  He is now requiring 8 to 10 L supplemental oxygen.  Sildenafil dose decreased to 10 mg, heart failure team advised riociguat to be started once it is delivered to his home.  Assessment and Plan: * Acute on chronic diastolic CHF Acute hypoxic respiratory failure Severe pulmonary hypertension History of prior pulmonary embolism CTA with chronic pulmonary embolism.  Patient follows vascular team and had thrombectomy done last November. Cardiology evaluated the patient recommended right heart cath which showed severely elevated PA pressures and reduced cardiac index.   09/20/23 Patient is started on IV milrinone and sildenafil therapy.   09/22/23 tunneled right internal jugular CVC placement for milrinone gtt. Heparin held 09/23/23 due to hemoptysis. 09/24/23 Coumadin held due to persistent hemoptysis, around blood in  cannister since night.  09/25/23 oxygen requirement worsened, does have persistent hemoptysis Coumadin held, pulmonary started bronchodilators, steroids.  Sildenafil dose decreased to 10mg  per Cardiology given hemoptysis. 09/26/23 Heart failure team stopped spironolactone, agreed with lowering Sildenafil, advised riociguat if available (through home delivery), RHC for worsening symptoms.  His kidney function worsened.  Held diuretics, spironolactone.  Restarted Coumadin. Continues to be on 10 L supplemental oxygen to maintain saturation greater than 90%. Continue digoxin therapy. Hold metoprolol, losartan due to borderline low blood pressures. Heart failure team advised [facility] vascular team for pulmonary angiography once more medically stable upon discharge.  Type 2 diabetes mellitus with diabetic chronic kidney disease (HCC) A1c 6.1. He is eating fair. Hypoglycemia protocol  Essential hypertension BP borderline low. Caution with antihypertensives as blood pressure borderline low.  Acute on CKD (chronic kidney disease), stage IIIa Renal function worsened like;y due to diuresis. Continue to monitor while on diuretics. Avoid other nephrotoxic drugs. Monitor daily renal function.  Subtherapeutic international normalized ratio (INR) Chronic pulmonary embolism/chronic DVT s/p IVC filter Stopped heparin drip due to significant hemoptysis Restarted Coumadin as coughing up blood improved with lowering sildenafil dose. Morbid obesity with BMI of 50.0-59.9, adult (HCC) Complicating factor to overall prognosis and care. Diet, exercise and weight reduction advised.  Chronic pulmonary embolism (HCC) Causing elevated pulmonary pressures Chronic DVT s/p IVC. Further management as outpatient. He would benefit from pulmonary angiography at a later time once more medically stable.     Out of bed to chair. Incentive spirometry. Nursing supportive care. Fall, aspiration precautions. DVT  prophylaxis   Code Status: Full Code  Subjective: Patient is seen and examined today morning.  Hemoptysis better today. He  is currently on 8-10 L supplemental oxygen.  Encouraged incentive spirometry.  Physical Exam: Vitals:   09/26/23 1206 09/26/23 1300 09/26/23 1400 09/26/23 1500  BP: 138/77 (!) 127/95 (!) 147/125 (!) 126/104  Pulse: 100 (!) 103 96 (!) 103  Resp: 17 18 19 20   Temp: 98.4 F (36.9 C)     TempSrc: Axillary     SpO2: 90% 92% (!) 89% (!) 88%  Weight:      Height:        General - Middle aged morbidly obese African-American male, moderate respiratory distress HEENT - PERRLA, EOMI, atraumatic head, non tender sinuses. Lung -distant breath sounds, diffuse rales, rhonchi, no wheezes. Heart - S1, S2 heard, no murmurs, rubs, 1+ pitting pedal edema. Abdomen - Soft, non tender obese, bowel sounds good Neuro - Alert, awake and oriented x 3, non focal exam. Skin - Warm and dry.  Data Reviewed:      Latest Ref Rng & Units 09/26/2023    5:10 AM 09/25/2023    4:28 AM 09/24/2023    5:05 AM  CBC  WBC 4.0 - 10.5 K/uL 7.9  4.8  5.3   Hemoglobin 13.0 - 17.0 g/dL 01.0  27.2  53.6   Hematocrit 39.0 - 52.0 % 45.4  44.5  44.0   Platelets 150 - 400 K/uL 265  226  219       Latest Ref Rng & Units 09/26/2023    5:10 AM 09/25/2023    4:28 AM 09/24/2023    5:05 AM  BMP  Glucose 70 - 99 mg/dL 644  034  742   BUN 6 - 20 mg/dL 45  34  26   Creatinine 0.61 - 1.24 mg/dL 5.95  6.38  7.56   Sodium 135 - 145 mmol/L 131  133  132   Potassium 3.5 - 5.1 mmol/L 4.6  3.9  4.0   Chloride 98 - 111 mmol/L 96  97  96   CO2 22 - 32 mmol/L 23  24  23    Calcium 8.9 - 10.3 mg/dL 9.4  8.9  9.1    DG Chest Port 1 View Result Date: 09/26/2023 CLINICAL DATA:  43329 CHF (congestive heart failure) (HCC) 97293 EXAM: PORTABLE CHEST 1 VIEW COMPARISON:  09/25/2023 FINDINGS: Right IJ central venous catheter is stable in positioning. Stable size of the cardiac silhouette. Mild vascular congestion. No focal  airspace consolidation, pleural effusion, or pneumothorax. IMPRESSION: Mild pulmonary vascular congestion. Electronically Signed   By: Duanne Guess D.O.   On: 09/26/2023 13:50   DG Chest Port 1 View Result Date: 09/25/2023 CLINICAL DATA:  Hemoptysis. EXAM: PORTABLE CHEST 1 VIEW COMPARISON:  Radiograph 2 days ago 09/23/2023, most recent chest CT 09/20/2023 FINDINGS: Stable right-sided central line. The heart is normal in size. No focal airspace disease, pleural effusion, pulmonary edema or pneumothorax. IMPRESSION: No acute chest findings. Stable radiographic appearance of the chest. Electronically Signed   By: Narda Rutherford M.D.   On: 09/25/2023 16:47    Family Communication: Discussed with patient, he understand and agree. All questions answereed.  Disposition: Status is: Inpatient Remains inpatient appropriate because: IV milrinone, high flow oxygen, has hemoptysis.  Planned Discharge Destination: Home with Home Health     MDM level 3-patient is hypoxic requiring 8 to 10 L supplemental oxygen, has hemoptysis, with severe pulmonary hypertension.  Patient is on milrinone drip, Coumadin therapy.  Patient will need hemodynamic, electrolyte, telemetry close monitoring in the unit.  Patient is at high risk for sudden  clinical deterioration.  Author: Marcelino Duster, MD 09/26/2023 4:06 PM Secure chat 7am to 7pm For on call review [email or web address].

## 2023-09-26 NOTE — Progress Notes (Addendum)
ANTICOAGULATION CONSULT NOTE  Pharmacy Consult for warfarin  Indication: CTEPH / recurrent DVT / PE  Patient Measurements: Height: 5\' 7"  (170.2 cm) Weight: (!) 158 kg (348 lb 5.2 oz) IBW/kg (Calculated) : 66.1 Heparin Dosing Weight: 104.8 kg  Labs: Recent Labs    09/24/23 0505 09/25/23 0428 09/26/23 0510  HGB 14.5 14.6 14.8  HCT 44.0 44.5 45.4  PLT 219 226 265  LABPROT 22.6* 23.6* 21.1*  INR 2.0* 2.1* 1.8*  HEPARINUNFRC <0.10*  --   --   CREATININE 1.61* 1.76* 1.98*    Estimated Creatinine Clearance: 62.8 mL/min (A) (by C-G formula based on SCr of 1.98 mg/dL (H)).   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  Regular the workup has not been forthcoming) previously followed by [facility] Isaiah Serge at [facility]   Hepatic steatosis    Hypertension    Morbid obesity with BMI of 50.0-59.9, adult (HCC)    Oxygen deficiency    Peripheral vascular disease (HCC)    Presence of IVC filter    Has 2 IVC filters with significant thrombus burden superior to the filter.   Pulmonary embolus (HCC) 12/2016   Initially just treated with anticoagulation, but in February 2023 in April 2024 treated with EKOS thrombectomy for submassive PE.   Ventricular trigeminy    Weakness of both lower limbs     Assessment: Pt is a 53 yo male w/ hx of DVT/PE on warfarin (7.5mg  dose PTA, last dose 1/19 @ 2345) presenting to ED c/o SOB x 1.5 days found with elevated troponin I level trending up.  Pt has/had cardiac cath procedure scheduled for today at 0730. Hx: multiple DVTs/PES dating back to 7s. Per chart review, has  MTHFR gene mutation and was previously followed by Hematology (Dr. Isaiah Serge)   Last note from anticoagulation clinic on 09/15/23 noted INR goal 2.5-3 given recurrent thromboembolic events with INR < 2. Patient with supratherapeutic INRs on 1/16 and 1/17 so dose was decreased from 7.5 mg daily to 5 mg daily and patient was instructed to hold warfarin until 1/19.  Goal of Therapy:  INR 2.5 - 3 Monitor platelets by anticoagulation protocol: Yes   Plan:  Heparin: --Per cardiology, can discontinue heparin bridge  Warfarin: --INR trending back down ISO held doses d/t  hemoptysis --will add warfarin 10 mg po x 1 today --Will assess daily --INR daily and CBC per protocol  Lowella Bandy 09/26/2023 9:14 AM

## 2023-09-26 NOTE — TOC Progression Note (Signed)
Transition of Care [facility]) - Progression Note    Patient Details  Name: Ryan [name] MRN: [id number] Date of Birth: 01/22/1971  Transition of Care [facility]) CM/SW Contact  Margarito Liner, LCSW Phone Number: 09/26/2023, 2:37 PM  Clinical Narrative:  CSW continues to follow progress.   Expected Discharge Plan: Home w Home Health Services Barriers to Discharge: Continued Medical Work up  Expected Discharge Plan and Services     Post Acute Care Choice: Home Health Living arrangements for the past 2 months: Single Family Home                                       Social Determinants of Health (SDOH) Interventions SDOH Screenings   Food Insecurity: Patient Declined (09/20/2023)  Housing: Patient Declined (09/20/2023)  Transportation Needs: Patient Declined (09/20/2023)  Utilities: Patient Declined (09/20/2023)  Depression (PHQ2-9): Low Risk  (08/18/2023)  Financial Resource Strain: Low Risk  (08/18/2023)  Physical Activity: Insufficiently Active (08/18/2023)  Social Connections: Moderately Integrated (08/18/2023)  Stress: No Stress Concern Present (08/18/2023)  Tobacco Use: Low Risk  (09/20/2023)    Readmission Risk Interventions    09/23/2023    4:32 PM  Readmission Risk Prevention Plan  Transportation Screening Complete  PCP or Specialist Appt within 3-5 Days Complete  HRI or Home Care Consult Complete  Social Work Consult for Recovery Care Planning/Counseling Complete  Palliative Care Screening Not Applicable  Medication Review Oceanographer) Complete

## 2023-09-26 NOTE — Progress Notes (Signed)
Patient requested to have high flow nasal cannula applied after 30 minutes of trailing the cpap placed by respiratory therapist.

## 2023-09-27 DIAGNOSIS — I5033 Acute on chronic diastolic (congestive) heart failure: Secondary | ICD-10-CM | POA: Diagnosis not present

## 2023-09-27 DIAGNOSIS — R0600 Dyspnea, unspecified: Secondary | ICD-10-CM | POA: Diagnosis not present

## 2023-09-27 DIAGNOSIS — I825Z3 Chronic embolism and thrombosis of unspecified deep veins of distal lower extremity, bilateral: Secondary | ICD-10-CM | POA: Diagnosis not present

## 2023-09-27 DIAGNOSIS — I10 Essential (primary) hypertension: Secondary | ICD-10-CM | POA: Diagnosis not present

## 2023-09-27 DIAGNOSIS — I50810 Right heart failure, unspecified: Secondary | ICD-10-CM | POA: Diagnosis not present

## 2023-09-27 LAB — PROTIME-INR
INR: 2 — ABNORMAL HIGH (ref 0.8–1.2)
Prothrombin Time: 23.2 s — ABNORMAL HIGH (ref 11.4–15.2)

## 2023-09-27 LAB — GLUCOSE, CAPILLARY
Glucose-Capillary: 157 mg/dL — ABNORMAL HIGH (ref 70–99)
Glucose-Capillary: 165 mg/dL — ABNORMAL HIGH (ref 70–99)
Glucose-Capillary: 199 mg/dL — ABNORMAL HIGH (ref 70–99)

## 2023-09-27 LAB — BASIC METABOLIC PANEL
Anion gap: 10 (ref 5–15)
BUN: 52 mg/dL — ABNORMAL HIGH (ref 6–20)
CO2: 24 mmol/L (ref 22–32)
Calcium: 9.2 mg/dL (ref 8.9–10.3)
Chloride: 97 mmol/L — ABNORMAL LOW (ref 98–111)
Creatinine, Ser: 1.73 mg/dL — ABNORMAL HIGH (ref 0.61–1.24)
GFR, Estimated: 47 mL/min — ABNORMAL LOW (ref >60)
Glucose, Bld: 152 mg/dL — ABNORMAL HIGH (ref 70–99)
Potassium: 4.7 mmol/L (ref 3.5–5.1)
Sodium: 131 mmol/L — ABNORMAL LOW (ref 135–145)

## 2023-09-27 MED ORDER — ALTEPLASE 2 MG IJ SOLR
2.0000 mg | Freq: Once | INTRAMUSCULAR | Status: AC
  Administered 2023-09-27: 2 mg
  Filled 2023-09-27: qty 2

## 2023-09-27 MED ORDER — WARFARIN SODIUM 7.5 MG PO TABS
7.5000 mg | ORAL_TABLET | Freq: Once | ORAL | Status: AC
  Administered 2023-09-27: 7.5 mg via ORAL
  Filled 2023-09-27 (×2): qty 1

## 2023-09-27 MED ORDER — STERILE WATER FOR INJECTION IJ SOLN
INTRAMUSCULAR | Status: AC
  Administered 2023-09-27: 10 mL
  Filled 2023-09-27: qty 10

## 2023-09-27 NOTE — Progress Notes (Signed)
Physical Therapy Treatment Patient Details Name: Ryan [name] MRN: [id number] DOB: 12-10-1970 Today's Date: 09/27/2023   History of Present Illness 53 y.o. male  who presented to the ED with a 2-day history of shortness of breath and sharp chest pain with deep inspiration. R cardiac cath yesterday 1/21. 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    PT Comments  Patient continues to be Mod I for bed mobility and transfers. Supervision provided for standing activity and ambulation for safety. Patient walked a lap around the nursing station with mild shortness of breath. Sp02 down briefly to 89% as patient requesting to walk on room air, with return to low 90's with 6 L02. Encourage energy conservation strategies as able. PT will continue to follow.    If plan is discharge home, recommend the following: Assist for transportation;Help with stairs or ramp for entrance   Can travel by private vehicle        Equipment Recommendations  None recommended by PT    Recommendations for Other Services       Precautions / Restrictions Precautions Precautions: Fall Precaution Comments: 02 Restrictions Weight Bearing Restrictions Per Provider Order: No     Mobility  Bed Mobility Overal bed mobility: Modified Independent                  Transfers Overall transfer level: Modified independent Equipment used: None                    Ambulation/Gait Ambulation/Gait assistance: Supervision Gait Distance (Feet): 175 Feet Assistive device: None Gait Pattern/deviations: Step-through pattern Gait velocity: decreased     General Gait Details: patient requesting to ambulate initially on room air. Sp02 briefly down to 89% after walking ~ 170ft with mild shortness of breath. 02 turned back on with Sp02 in the low 90's. cues for breathing and rest  breaks as needed   Stairs             Wheelchair Mobility     Tilt Bed    Modified Rankin (Stroke Patients Only)       Balance Overall balance assessment: Needs assistance Sitting-balance support: Feet supported Sitting balance-Leahy Scale: Good     Standing balance support: No upper extremity supported Standing balance-Leahy Scale: Good                              Cognition Arousal: Alert Behavior During Therapy: WFL for tasks assessed/performed Overall Cognitive Status: Within Functional Limits for tasks assessed                                          Exercises      General Comments        Pertinent Vitals/Pain Pain Assessment Pain Assessment: No/denies pain    Home Living                          Prior Function            PT Goals (current goals can now be found in the care plan section) Acute Rehab PT Goals Patient Stated Goal: to improve, return home PT Goal Formulation: With patient Time For Goal  Achievement: 09/30/23 Potential to Achieve Goals: Good Progress towards PT goals: Progressing toward goals    Frequency    Min 1X/week      PT Plan      Co-evaluation              AM-PAC PT "6 Clicks" Mobility   Outcome Measure  Help needed turning from your back to your side while in a flat bed without using bedrails?: None Help needed moving from lying on your back to sitting on the side of a flat bed without using bedrails?: None Help needed moving to and from a bed to a chair (including a wheelchair)?: None Help needed standing up from a chair using your arms (e.g., wheelchair or bedside chair)?: None Help needed to walk in hospital room?: A Little Help needed climbing 3-5 steps with a railing? : A Little 6 Click Score: 22    End of Session Equipment Utilized During Treatment: Oxygen Activity Tolerance: Patient tolerated treatment well Patient left: in chair;with call bell/phone  within reach Nurse Communication: Mobility status PT Visit Diagnosis: Muscle weakness (generalized) (M62.81);Difficulty in walking, not elsewhere classified (R26.2)     Time: 4098-1191 PT Time Calculation (min) (ACUTE ONLY): 28 min  Charges:    $Therapeutic Activity: 23-37 mins PT General Charges $$ ACUTE PT VISIT: 1 Visit                    Donna Bernard, PT, MPT    Ina Homes 09/27/2023, 1:16 PM

## 2023-09-27 NOTE — Plan of Care (Signed)
  Problem: Education: Goal: Ability to describe self-care measures that may prevent or decrease complications (Diabetes Survival Skills Education) will improve Outcome: Progressing Goal: Individualized Educational Video(s) Outcome: Progressing   Problem: Coping: Goal: Ability to adjust to condition or change in health will improve Outcome: Progressing   Problem: Fluid Volume: Goal: Ability to maintain a balanced intake and output will improve Outcome: Progressing   Problem: Health Behavior/Discharge Planning: Goal: Ability to identify and utilize available resources and services will improve Outcome: Progressing Goal: Ability to manage health-related needs will improve Outcome: Progressing   Problem: Nutritional: Goal: Maintenance of adequate nutrition will improve Outcome: Progressing Goal: Progress toward achieving an optimal weight will improve Outcome: Progressing   Problem: Skin Integrity: Goal: Risk for impaired skin integrity will decrease Outcome: Progressing   Problem: Tissue Perfusion: Goal: Adequacy of tissue perfusion will improve Outcome: Progressing   Problem: Education: Goal: Understanding of cardiac disease, CV risk reduction, and recovery process will improve Outcome: Progressing Goal: Individualized Educational Video(s) Outcome: Progressing   Problem: Activity: Goal: Ability to tolerate increased activity will improve Outcome: Progressing   Problem: Cardiac: Goal: Ability to achieve and maintain adequate cardiovascular perfusion will improve Outcome: Progressing   Problem: Health Behavior/Discharge Planning: Goal: Ability to safely manage health-related needs after discharge will improve Outcome: Progressing   Problem: Education: Goal: Understanding of CV disease, CV risk reduction, and recovery process will improve Outcome: Progressing Goal: Individualized Educational Video(s) Outcome: Progressing   Problem: Activity: Goal: Ability to  return to baseline activity level will improve Outcome: Progressing   Problem: Cardiovascular: Goal: Ability to achieve and maintain adequate cardiovascular perfusion will improve Outcome: Progressing Goal: Vascular access site(s) Level 0-1 will be maintained Outcome: Progressing   Problem: Health Behavior/Discharge Planning: Goal: Ability to safely manage health-related needs after discharge will improve Outcome: Progressing   Problem: Education: Goal: Knowledge of General Education information will improve Description: Including pain rating scale, medication(s)/side effects and non-pharmacologic comfort measures Outcome: Progressing   Problem: Health Behavior/Discharge Planning: Goal: Ability to manage health-related needs will improve Outcome: Progressing   Problem: Clinical Measurements: Goal: Ability to maintain clinical measurements within normal limits will improve Outcome: Progressing Goal: Will remain free from infection Outcome: Progressing Goal: Diagnostic test results will improve Outcome: Progressing Goal: Respiratory complications will improve Outcome: Progressing Goal: Cardiovascular complication will be avoided Outcome: Progressing   Problem: Activity: Goal: Risk for activity intolerance will decrease Outcome: Progressing   Problem: Nutrition: Goal: Adequate nutrition will be maintained Outcome: Progressing   Problem: Coping: Goal: Level of anxiety will decrease Outcome: Progressing   Problem: Elimination: Goal: Will not experience complications related to bowel motility Outcome: Progressing Goal: Will not experience complications related to urinary retention Outcome: Progressing   Problem: Pain Managment: Goal: General experience of comfort will improve and/or be controlled Outcome: Progressing   Problem: Safety: Goal: Ability to remain free from injury will improve Outcome: Progressing   Problem: Skin Integrity: Goal: Risk for impaired  skin integrity will decrease Outcome: Progressing

## 2023-09-27 NOTE — Progress Notes (Signed)
ANTICOAGULATION CONSULT NOTE  Pharmacy Consult for warfarin  Indication: CTEPH / recurrent DVT / PE  Patient Measurements: Height: 5\' 7"  (170.2 cm) Weight: (!) 158 kg (348 lb 5.2 oz) IBW/kg (Calculated) : 66.1 Heparin Dosing Weight: 104.8 kg  Labs: Recent Labs    09/25/23 0428 09/26/23 0510 09/27/23 0444  HGB 14.6 14.8  --   HCT 44.5 45.4  --   PLT 226 265  --   LABPROT 23.6* 21.1* 23.2*  INR 2.1* 1.8* 2.0*  CREATININE 1.76* 1.98*  --     Estimated Creatinine Clearance: 62.8 mL/min (A) (by C-G formula based on SCr of 1.98 mg/dL (H)).   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  Regular the workup has not been forthcoming) previously followed by Dr. Isaiah Serge at [facility]   Hepatic steatosis    Hypertension    Morbid obesity with BMI of 50.0-59.9, adult (HCC)    Oxygen deficiency    Peripheral vascular disease (HCC)    Presence of IVC filter    Has 2 IVC filters with significant thrombus burden superior to the filter.   Pulmonary embolus (HCC) 12/2016   Initially just treated with anticoagulation, but in February 2023 in April 2024 treated with EKOS thrombectomy for submassive PE.   Ventricular trigeminy    Weakness of both lower limbs     Assessment: Pt is a 53 yo male w/ hx of DVT/PE on warfarin (7.5mg  dose PTA, last dose 1/19 @ 2345) presenting to ED c/o SOB x 1.5 days found with elevated troponin I level trending up.  Pt has/had cardiac cath procedure scheduled for today at 0730. 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 Per chart review, has MTHFR gene mutation and was  previously followed by Hematology (Dr. Isaiah Serge)   Last note from anticoagulation clinic on 09/15/23 noted INR goal 2.5-3 given recurrent thromboembolic events with INR < 2. Patient with supratherapeutic INRs on 1/16 and 1/17 so dose was decreased from 7.5 mg daily to 5 mg daily and patient was instructed to hold warfarin until 1/19.  Goal of Therapy:  INR 2.5 - 3 Monitor platelets by anticoagulation protocol: Yes  Warfarin Plan: --INR trending back up --will add warfarin 7.5 mg po x 1 today --Will assess daily --INR daily and CBC per protocol  Lowella Bandy 09/27/2023 7:15 AM

## 2023-09-27 NOTE — Progress Notes (Signed)
Progress Note   Patient: Ryan [name]:[id number] DOB: 1970-09-29 DOA: 09/20/2023     7 DOS: the patient was seen and examined on 09/27/2023   Brief hospital course: Ryan [name] is a 53 y.o. 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   Patient is admitted for further management evaluation of acute dyspnea, elevated troponin.  He is evaluated by heart failure team for severe right heart failure, pulmonary hypertension. Patient got right heart cath which showed severely elevated PA pressures and reduced cardiac index.  Patient is started on milrinone gtt., sildenafil therapy.  He is continued on digoxin, spironolactone therapy.  Patient is initially was on 4 L supplemental oxygen, later developed significant hemoptysis has heparin, Coumadin held.  He is now requiring 8 to 10 L supplemental oxygen.  Sildenafil dose decreased to 10 mg, heart failure team advised Adempas (riociguat) to be started once it is delivered to his home.  Assessment and Plan: * Acute on chronic diastolic CHF Acute hypoxic respiratory failure Severe pulmonary hypertension History of prior pulmonary embolism CTA with chronic pulmonary embolism.  Patient follows vascular team and had thrombectomy done last November. Cardiology evaluated the patient recommended right heart cath which showed severely elevated PA pressures and reduced cardiac index.   09/20/23 Patient is started on IV milrinone drip, IV lasix and sildenafil therapy.   09/22/23 tunneled right internal jugular CVC placement for milrinone gtt. Heparin held 09/23/23 due to hemoptysis. 09/24/23 Coumadin held due to persistent hemoptysis,  around blood in cannister since night.  09/25/23 oxygen requirement worsened, does have persistent hemoptysis Coumadin on hold, pulmonary started bronchodilators, steroids.  Sildenafil dose decreased to 10mg  per Cardiology given hemoptysis. 09/26/23 Heart failure team stopped spironolactone, agreed with lowering Sildenafil, advised Adempas (riociguat) once available (through home delivery), RHC for worsening symptoms.  His kidney function worsened.  Held Lasix, spironolactone.  Restarted Coumadin. 09/27/23-hemoptysis much improved, he is on 5 to 6 L supplemental oxygen. Continue digoxin therapy. Hold metoprolol, losartan due to borderline low blood pressures. Heart failure team advised [facility] vascular team for pulmonary angiography once more medically stable upon discharge.  Type 2 diabetes mellitus with diabetic chronic kidney disease (HCC) A1c 6.1. He is eating fair. Hypoglycemia protocol  Essential hypertension BP borderline low. Caution with antihypertensives as blood pressure borderline low.  Acute on CKD (chronic kidney disease), stage IIIa Renal function better today, continue to hold diuresis. Avoid other nephrotoxic drugs. Monitor daily renal function.  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  Morbid obesity with BMI of 50.0-59.9, adult (HCC) Complicating factor to overall prognosis and care. Diet, exercise and weight reduction advised.  Chronic pulmonary embolism (HCC) Causing elevated pulmonary pressures Chronic DVT s/p IVC. Further management as outpatient. He would benefit from pulmonary angiography at a later time once more medically stable.    Out of bed to chair. Incentive spirometry. Nursing supportive care. Fall, aspiration precautions. DVT prophylaxis   Code  Status: Full Code  Subjective: Patient is seen and examined today morning.  He is sitting in chair,  states he walked around.  Hemoptysis much improved. He is currently on 6 L supplemental oxygen.  Feels better today, eating fair  Physical Exam: Vitals:   09/27/23 0930 09/27/23 1000 09/27/23 1222 09/27/23 1400  BP:   136/84 133/89  Pulse: 98 (!) 109 (!) 109 100  Resp: 15 20 (!) 28 18  Temp:   98.4 F (36.9 C)   TempSrc:   Oral   SpO2: 94% 91% 94% 93%  Weight:      Height:        General - Middle aged morbidly obese African-American male, out of bed, mild respiratory distress HEENT - PERRLA, EOMI, atraumatic head, non tender sinuses. Lung -distant breath sounds, diffuse rales, rhonchi, no wheezes. Heart - S1, S2 heard, no murmurs, rubs, chronic pedal edema. Abdomen - Soft, non tender obese, bowel sounds good Neuro - Alert, awake and oriented x 3, non focal exam. Skin - Warm and dry.  Data Reviewed:      Latest Ref Rng & Units 09/26/2023    5:10 AM 09/25/2023    4:28 AM 09/24/2023    5:05 AM  CBC  WBC 4.0 - 10.5 K/uL 7.9  4.8  5.3   Hemoglobin 13.0 - 17.0 g/dL 04.5  40.9  81.1   Hematocrit 39.0 - 52.0 % 45.4  44.5  44.0   Platelets 150 - 400 K/uL 265  226  219       Latest Ref Rng & Units 09/27/2023    4:44 AM 09/26/2023    5:10 AM 09/25/2023    4:28 AM  BMP  Glucose 70 - 99 mg/dL 914  782  956   BUN 6 - 20 mg/dL 52  45  34   Creatinine 0.61 - 1.24 mg/dL 2.13  0.86  5.78   Sodium 135 - 145 mmol/L 131  131  133   Potassium 3.5 - 5.1 mmol/L 4.7  4.6  3.9   Chloride 98 - 111 mmol/L 97  96  97   CO2 22 - 32 mmol/L 24  23  24    Calcium 8.9 - 10.3 mg/dL 9.2  9.4  8.9    DG Chest Port 1 View Result Date: 09/26/2023 CLINICAL DATA:  46962 CHF (congestive heart failure) (HCC) 97293 EXAM: PORTABLE CHEST 1 VIEW COMPARISON:  09/25/2023 FINDINGS: Right IJ central venous catheter is stable in positioning. Stable size of the cardiac silhouette. Mild vascular congestion. No focal  airspace consolidation, pleural effusion, or pneumothorax. IMPRESSION: Mild pulmonary vascular congestion. Electronically Signed   By: Duanne Guess D.O.   On: 09/26/2023 13:50   DG Chest Port 1 View Result Date: 09/25/2023 CLINICAL DATA:  Hemoptysis. EXAM: PORTABLE CHEST 1 VIEW COMPARISON:  Radiograph 2 days ago 09/23/2023, most recent chest CT 09/20/2023 FINDINGS: Stable right-sided central line. The heart is normal in size. No focal airspace disease, pleural effusion, pulmonary edema or pneumothorax. IMPRESSION: No acute chest findings. Stable radiographic appearance of the chest. Electronically Signed   By: Narda Rutherford M.D.   On: 09/25/2023 16:47    Family Communication: Discussed with patient, he understand and agree. All questions answereed.  Disposition: Status is: Inpatient Remains inpatient appropriate because: IV milrinone, 6 L oxygen, improved hemoptysis.  Planned Discharge Destination: Home with Home Health     MDM level 3-patient is hypoxic requiring 6 L supplemental oxygen, has hemoptysis, with severe pulmonary hypertension.  Patient is on milrinone drip, Coumadin therapy.  Patient will need hemodynamic, electrolyte, telemetry close monitoring in the unit.  Patient is at high risk for sudden clinical deterioration.  Author: Marcelino Duster, MD 09/27/2023 3:16 PM Secure chat 7am to 7pm For on call review [email or web address].

## 2023-09-27 NOTE — Progress Notes (Signed)
Advanced Heart Failure Rounding Note  HF Cardiologist: Dr. 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 Co-ox pending   Remains off diuretics. SCr trending back down, 1.98>>1.73. CVP 5-6   Continued on 10L [place] overnight but feels subjectively much better. C/w some mild hemoptysis but overall appears to be slowing.   Still waiting on Adepmas to be delivered to home.  We weaned [place] down to 6L and observed O2 sats while in the room, which remained stable ~92%.   Objective:   Weight Range: (!) 158 kg Body mass index is 54.56 kg/m.   Vital Signs:   Temp:  [98 F (36.7 C)-98.5 F (36.9 C)] 98.4 F (36.9 C) (01/27 2300) Pulse Rate:  [81-108] 81 (01/28 0700) Resp:  [14-28] 14 (01/28 0700) BP: (105-147)/(55-125) 144/85 (01/28 0700) SpO2:  [85 %-100 %] 91 % (01/28 0729) Last BM Date : 09/24/23  Weight change: Filed Weights   09/26/23 0500  Weight: (!) 158 kg    Intake/Output:   Intake/Output Summary (Last 24 hours) at 09/27/2023 0759 Last data filed at 09/27/2023 0302 Gross per 24 hour  Intake 1101.03 ml  Output 550 ml  Net 551.03 ml      Physical Exam    CVP 5-6  General:  morbidly obese, on supp O2 via [place] but no visible respiratory difficulty HEENT: normal Neck: supple. Thick neck, JVD not well visualized. Carotids 2+ bilat; no bruits. No lymphadenopathy or thyromegaly appreciated. Cor: PMI nondisplaced. Regular rate & rhythm. No rubs, gallops or murmurs. + tunneled PICC left upper chest  Lungs: decreased BS at the bases bilaterally  Abdomen: obese, soft, nontender, nondistended. No hepatosplenomegaly. No bruits or masses. Good bowel sounds. Extremities: no cyanosis, clubbing, rash. +chronic venous stasis dermatis bilaterally but no present edema Neuro: alert & oriented x 3, cranial nerves grossly intact. moves all 4 extremities w/o difficulty. Affect pleasant.    Telemetry   NSR>>ST 90s-low  100s personally reviewed   Labs    CBC Recent Labs    09/25/23 0428 09/26/23 0510  WBC 4.8 7.9  HGB 14.6 14.8  HCT 44.5 45.4  MCV 93.3 93.4  PLT 226 265   Basic Metabolic Panel Recent Labs    57/84/69 0428 09/26/23 0510 09/27/23 0444  NA 133* 131* 131*  K 3.9 4.6 4.7  CL 97* 96* 97*  CO2 24 23 24   GLUCOSE 126* 154* 152*  BUN 34* 45* 52*  CREATININE 1.76* 1.98* 1.73*  CALCIUM 8.9 9.4 9.2  MG 2.3  --   --    Liver Function Tests No results for input(s): "AST", "ALT", "ALKPHOS", "BILITOT", "PROT", "ALBUMIN" in the last 72 hours.  No results for input(s): "LIPASE", "AMYLASE" in the last 72 hours. Cardiac Enzymes No results for input(s): "CKTOTAL", "CKMB", "CKMBINDEX", "TROPONINI" in the last 72 hours.  BNP: BNP (last 3 results) Recent Labs    07/19/23 0310 08/19/23 0213 09/19/23 2216  BNP 298.0* 267.8* 280.4*    ProBNP (last 3 results) No results for input(s): "PROBNP" in the last 8760 hours.   D-Dimer No results for input(s): "DDIMER" in the last 72 hours. Hemoglobin A1C No results for input(s): "HGBA1C" in the last 72 hours. Fasting Lipid Panel No results for input(s): "CHOL", "HDL", "LDLCALC", "TRIG", "CHOLHDL", "LDLDIRECT" in the last 72 hours. Thyroid Function Tests No results for input(s): "TSH", "T4TOTAL", "T3FREE", "THYROIDAB" in the last 72 hours.  Invalid input(s): "FREET3"  Other  results:   Imaging    DG Chest Port 1 View Result Date: 09/26/2023 CLINICAL DATA:  84132 CHF (congestive heart failure) (HCC) 97293 EXAM: PORTABLE CHEST 1 VIEW COMPARISON:  09/25/2023 FINDINGS: Right IJ central venous catheter is stable in positioning. Stable size of the cardiac silhouette. Mild vascular congestion. No focal airspace consolidation, pleural effusion, or pneumothorax. IMPRESSION: Mild pulmonary vascular congestion. Electronically Signed   By: Duanne Guess D.O.   On: 09/26/2023 13:50      Medications:     Scheduled Medications:   Chlorhexidine Gluconate Cloth  6 each Topical Daily   digoxin  0.0625 mg Oral Daily   methylPREDNISolone (SOLU-MEDROL) injection  40 mg Intravenous Q12H   sildenafil  10 mg Oral TID   sodium chloride flush  10-40 mL Intracatheter Q12H   Warfarin - Pharmacist Dosing Inpatient   Does not apply q1600    Infusions:  milrinone 0.25 mcg/kg/min (09/27/23 0302)    PRN Medications: acetaminophen, guaiFENesin-dextromethorphan, ipratropium-albuterol, nitroGLYCERIN, ondansetron (ZOFRAN) IV, mouth rinse, sodium chloride flush    Patient Profile   53 y.o. 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 Thrombectomy would be very high risk with his weight and current decompensation, could consider balloon angioplasty -Continues on 0.25 milrinone, Co-ox pending. CVP 5-6, SCr improving off diuretics -Continue to hold diuretics today  -Continue Milrinone 0.25 (planning home milrinone) -Continue Sildenafil 10 mg tid (reduced dose given hemoptysis, increased risk of pulmonary hemorrhage w/ this agent) -Working to get on riociguat (PharmD assisting)  -Continue a/c w/ warfarin, INR 2.0 today  -Off beta blocker with low-output -Holding Spiro w/ AKI  -Continue digoxin 0.0625 mg daily, dig level 0.3 on 01/23. Watch SCr closely  -Known OSA. Reports he is waiting to receive CPAP.   2.  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 The lower filter appeared occluded on prior angiography with multiple lower venous collaterals. Vascular surgery previously felt he was not a candidate for surgery. Suspect this has contributed to his lower extremity edema, chronic PE and "faliure" of OAC. Difficult situation, even if CTEPH treated he would be at high risk for future clots and ongoing lower extremity swelling. -Chronic, stable PE on CTA this admit.  -Resumed Warfarin. INR now therapeutic at 2.0    3. Atrial fibrillation/flutter -ECG on presentation with sinus tach, continues in ST today  -Stopped beta blocker as above -on Warfarin    4. CKD IIIa -Baseline Scr seems to be 1.4-1.7 -Scr 1.7 today  -Support CO with milrinone -Holding diuretics for now w/ low CVPs     Tunneled single lumen PICC with IR done 1/23. He will need home milrinone. Have reached out to [facility] home infusion rep, Jeri Modena RN following. Will need to see O2 requirements improve before d/c home. Will like to get him back to his previous O2  requirements, 2-3L/min.    Length of Stay: [address], [place]  09/27/2023, 7:59 AM  Advanced Heart Failure Team Pager [phone] (M-F; 7a - 5p)  Please contact [facility] for night-coverage after hours (5p -7a ) and weekends on [email or web address]

## 2023-09-28 DIAGNOSIS — I50810 Right heart failure, unspecified: Secondary | ICD-10-CM | POA: Diagnosis not present

## 2023-09-28 DIAGNOSIS — R0600 Dyspnea, unspecified: Secondary | ICD-10-CM | POA: Diagnosis not present

## 2023-09-28 DIAGNOSIS — I10 Essential (primary) hypertension: Secondary | ICD-10-CM | POA: Diagnosis not present

## 2023-09-28 DIAGNOSIS — I825Z3 Chronic embolism and thrombosis of unspecified deep veins of distal lower extremity, bilateral: Secondary | ICD-10-CM | POA: Diagnosis not present

## 2023-09-28 DIAGNOSIS — I5033 Acute on chronic diastolic (congestive) heart failure: Secondary | ICD-10-CM | POA: Diagnosis not present

## 2023-09-28 LAB — COOXEMETRY PANEL
Carboxyhemoglobin: 1.3 % (ref 0.5–1.5)
Carboxyhemoglobin: 1.3 % (ref 0.5–1.5)
Methemoglobin: <0.7 % (ref 0.0–1.5)
Methemoglobin: 1 % (ref 0.0–1.5)
O2 Saturation: 80.8 %
O2 Saturation: 74 %
Total hemoglobin: 14.4 g/dL (ref 12.0–16.0)
Total hemoglobin: 14.7 g/dL (ref 12.0–16.0)
Total oxygen content: 79.4 %
Total oxygen content: 72.3 %

## 2023-09-28 LAB — BASIC METABOLIC PANEL
Anion gap: 11 (ref 5–15)
BUN: 51 mg/dL — ABNORMAL HIGH (ref 6–20)
CO2: 24 mmol/L (ref 22–32)
Calcium: 9 mg/dL (ref 8.9–10.3)
Chloride: 97 mmol/L — ABNORMAL LOW (ref 98–111)
Creatinine, Ser: 1.54 mg/dL — ABNORMAL HIGH (ref 0.61–1.24)
GFR, Estimated: 54 mL/min — ABNORMAL LOW (ref >60)
Glucose, Bld: 230 mg/dL — ABNORMAL HIGH (ref 70–99)
Potassium: 5.1 mmol/L (ref 3.5–5.1)
Sodium: 132 mmol/L — ABNORMAL LOW (ref 135–145)

## 2023-09-28 LAB — GLUCOSE, CAPILLARY
Glucose-Capillary: 139 mg/dL — ABNORMAL HIGH (ref 70–99)
Glucose-Capillary: 180 mg/dL — ABNORMAL HIGH (ref 70–99)
Glucose-Capillary: 173 mg/dL — ABNORMAL HIGH (ref 70–99)
Glucose-Capillary: 219 mg/dL — ABNORMAL HIGH (ref 70–99)

## 2023-09-28 LAB — PROTIME-INR
INR: 2.7 — ABNORMAL HIGH (ref 0.8–1.2)
Prothrombin Time: 28.9 s — ABNORMAL HIGH (ref 11.4–15.2)

## 2023-09-28 MED ORDER — METHYLPREDNISOLONE SODIUM SUCC 40 MG IJ SOLR
40.0000 mg | Freq: Every day | INTRAMUSCULAR | Status: DC
  Administered 2023-09-29 – 2023-09-30 (×2): 40 mg via INTRAVENOUS
  Filled 2023-09-28 (×2): qty 1

## 2023-09-28 NOTE — Plan of Care (Signed)
  Problem: Education: Goal: Ability to describe self-care measures that may prevent or decrease complications (Diabetes Survival Skills Education) will improve Outcome: Progressing   Problem: Coping: Goal: Ability to adjust to condition or change in health will improve Outcome: Progressing   Problem: Fluid Volume: Goal: Ability to maintain a balanced intake and output will improve Outcome: Progressing   Problem: Nutritional: Goal: Maintenance of adequate nutrition will improve Outcome: Progressing   Problem: Skin Integrity: Goal: Risk for impaired skin integrity will decrease Outcome: Progressing   Problem: Tissue Perfusion: Goal: Adequacy of tissue perfusion will improve Outcome: Progressing   Problem: Activity: Goal: Ability to tolerate increased activity will improve Outcome: Progressing   Problem: Cardiac: Goal: Ability to achieve and maintain adequate cardiovascular perfusion will improve Outcome: Progressing   Problem: Activity: Goal: Ability to return to baseline activity level will improve Outcome: Progressing

## 2023-09-28 NOTE — Progress Notes (Signed)
ANTICOAGULATION CONSULT NOTE  Pharmacy Consult for warfarin  Indication: CTEPH / recurrent DVT / PE  Patient Measurements: Height: 5\' 7"  (170.2 cm) Weight: (!) 158.2 kg (348 lb 12.3 oz) IBW/kg (Calculated) : 66.1 Heparin Dosing Weight: 104.8 kg  Labs: Recent Labs    09/26/23 0510 09/27/23 0444 09/28/23 0422  HGB 14.8  --   --   HCT 45.4  --   --   PLT 265  --   --   LABPROT 21.1* 23.2* 28.9*  INR 1.8* 2.0* 2.7*  CREATININE 1.98* 1.73* 1.54*    Estimated Creatinine Clearance: 80.7 mL/min (A) (by C-G formula based on SCr of 1.54 mg/dL (H)).   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  Regular the workup has not been forthcoming) previously followed by Dr. Isaiah Serge at [name]   Hepatic steatosis    Hypertension    Morbid obesity with BMI of 50.0-59.9, adult (HCC)    Oxygen deficiency    Peripheral vascular disease (HCC)    Presence of IVC filter    Has 2 IVC filters with significant thrombus burden superior to the filter.   Pulmonary embolus (HCC) 12/2016   Initially just treated with anticoagulation, but in February 2023 in April 2024 treated with EKOS thrombectomy for submassive PE.   Ventricular trigeminy    Weakness of both lower limbs     Assessment: Pt is a 53 yo male w/ hx of DVT/PE on warfarin (7.5mg  dose PTA, last dose 1/19 @ 2345) presenting to ED c/o SOB x 1.5 days found with elevated troponin I level trending up.  Pt has/had cardiac cath procedure scheduled for today at 0730. 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 Per chart review, has MTHFR gene mutation and  was previously followed by Hematology (Dr. Isaiah Serge)   Last note from anticoagulation clinic on 09/15/23 noted INR goal 2.5-3 given recurrent thromboembolic events with INR < 2. Patient with supratherapeutic INRs on 1/16 and 1/17 so dose was decreased from 7.5 mg daily to 5 mg daily and patient was instructed to hold warfarin until 1/19.  Goal of Therapy:  INR 2.5 - 3 Monitor platelets by anticoagulation protocol: Yes  Warfarin Plan: --INR trending therapeutic, trend is up --will hold warfarin today given rapid rise in INR --Will assess daily --INR daily and CBC per protocol  Lowella Bandy 09/28/2023 7:22 AM

## 2023-09-28 NOTE — Progress Notes (Signed)
Progress Note   Patient: Ryan [name] AVW:[id number] DOB: 09/04/1970 DOA: 09/20/2023     8 DOS: the patient was seen and examined on 09/28/2023   Brief hospital course: VIKTOR PHILIPP is a 53 y.o. 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   Patient is admitted for further management evaluation of acute dyspnea, elevated troponin.  He is evaluated by heart failure team for severe right heart failure, pulmonary hypertension. Patient got right heart cath which showed severely elevated PA pressures and reduced cardiac index.  Patient is started on milrinone gtt., sildenafil therapy.  He is continued on digoxin, spironolactone therapy.  Patient is initially was on 4 L supplemental oxygen, later developed significant hemoptysis has heparin, Coumadin held.  He is now requiring 8 to 10 L supplemental oxygen.  Sildenafil dose decreased to 10 mg, heart failure team advised Adempas (riociguat) to be started once it is delivered to his home.  Assessment and Plan: * Acute on chronic diastolic CHF Acute hypoxic respiratory failure Severe pulmonary hypertension History of prior pulmonary embolism CTA with chronic pulmonary embolism.  Patient follows vascular team and had thrombectomy done last November. Cardiology evaluated the patient recommended right heart cath which showed severely elevated PA pressures and reduced cardiac index.   09/20/23 Patient is started on IV milrinone drip, IV lasix and sildenafil therapy.   09/22/23 tunneled right internal jugular CVC placement for milrinone gtt. Heparin held 09/23/23 due to hemoptysis. 09/24/23 Coumadin held due to persistent hemoptysis,  around blood in cannister since night.  09/25/23 oxygen requirement worsened, does have persistent hemoptysis Coumadin on hold, pulmonary started bronchodilators, steroids.  Sildenafil dose decreased to 10mg  per Cardiology given hemoptysis. 09/26/23 Heart failure team stopped spironolactone, agreed with lowering Sildenafil, advised Adempas (riociguat) once available (through home delivery), RHC for worsening symptoms.  His kidney function worsened.  Held Lasix, spironolactone.  Restarted Coumadin. 09/27/23-hemoptysis much improved, he is on 5 to 6 L supplemental oxygen. 09/28/23- feel better. Continues milrinone. On 3-4L supplemental o2. Continue digoxin therapy. Continue to hold metoprolol, losartan due to borderline low blood pressures.  Heart failure team advised [facility] vascular team for pulmonary angiography once more medically stable upon discharge.  Type 2 diabetes mellitus with diabetic chronic kidney disease (HCC) A1c 6.1. He is eating fair. Hypoglycemia protocol   Essential hypertension Afib -  BP borderline low. Continue telemetry, he is NSR. Caution with antihypertensives.  Acute on CKD (chronic kidney disease), stage IIIa Renal function improved with diuresis held. Avoid other nephrotoxic drugs. Monitor daily renal function.  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 INR 2.7 today.  Morbid obesity with BMI of 50.0-59.9, adult (HCC) Complicating factor to overall prognosis and care. Diet, exercise and weight reduction advised.  Recurrent pulmonary embolism (HCC), DVT Causing elevated pulmonary pressures Chronic DVT s/p IVC. Further management as outpatient. He would benefit from pulmonary angiography at a later time once more medically stable.    Out of bed to chair.  Incentive spirometry. Nursing supportive care. Fall, aspiration precautions. DVT prophylaxis   Code  Status: Full Code  Subjective: Patient is seen and examined today morning.  He is feeling better. Today on 3-4 supplemental oxygen. Hemoptysis better. Eating fair.RN notified arm picc does not have blood return.  Physical Exam: Vitals:   09/28/23 1808 09/28/23 1900 09/28/23 1950 09/28/23 2000  BP: (!) 108/59 116/70  135/78  Pulse: 87 88 78 81  Resp:    18  Temp:    97.8 F (36.6 C)  TempSrc:    Oral  SpO2: (!) 88% 91% 92% 90%  Weight:      Height:        General - Middle aged morbidly obese African-American male, out of bed, no respiratory distress HEENT - PERRLA, EOMI, atraumatic head, non tender sinuses. Lung -distant breath sounds, diffuse rales, rhonchi, no wheezes. Heart - S1, S2 heard, no murmurs, rubs, chronic pedal edema. Abdomen - Soft, non tender obese, bowel sounds good Neuro - Alert, awake and oriented x 3, non focal exam. Skin - Warm and dry.  Data Reviewed:      Latest Ref Rng & Units 09/26/2023    5:10 AM 09/25/2023    4:28 AM 09/24/2023    5:05 AM  CBC  WBC 4.0 - 10.5 K/uL 7.9  4.8  5.3   Hemoglobin 13.0 - 17.0 g/dL 16.1  09.6  04.5   Hematocrit 39.0 - 52.0 % 45.4  44.5  44.0   Platelets 150 - 400 K/uL 265  226  219       Latest Ref Rng & Units 09/28/2023    4:22 AM 09/27/2023    4:44 AM 09/26/2023    5:10 AM  BMP  Glucose 70 - 99 mg/dL 409  811  914   BUN 6 - 20 mg/dL 51  52  45   Creatinine 0.61 - 1.24 mg/dL 7.82  9.56  2.13   Sodium 135 - 145 mmol/L 132  131  131   Potassium 3.5 - 5.1 mmol/L 5.1  4.7  4.6   Chloride 98 - 111 mmol/L 97  97  96   CO2 22 - 32 mmol/L 24  24  23    Calcium 8.9 - 10.3 mg/dL 9.0  9.2  9.4    No results found.   Family Communication: Discussed with patient, he understand and agree. All questions answereed.  Disposition: Status is: Inpatient Remains inpatient appropriate because: IV milrinone, 3-4L oxygen, improved  hemoptysis.  Planned Discharge Destination: Home with Home Health     MDM level 3-patient is hypoxic requiring 4 L supplemental oxygen, has hemoptysis, with severe pulmonary hypertension.  Patient is on milrinone drip, Coumadin therapy.  Patient will need hemodynamic, electrolyte, telemetry close monitoring in the unit.  Patient is at high risk for sudden clinical deterioration.  Author: Marcelino Duster, MD 09/28/2023 8:58 PM Secure chat 7am to 7pm For on call review [email or web address].

## 2023-09-28 NOTE — Progress Notes (Signed)
Advanced Heart Failure Rounding Note  HF Cardiologist: Dr. Elwyn Lade  Chief Complaint: Dyspnea 2/2 RV failure with probable CTEPH  Subjective:    - Continues to make slow improvements; dyspnea has improved. Ambulated yesterday without O2.  - O2 weaned down to 4L today with sats ~92%.   Objective:   Weight Range: (!) 158.2 kg Body mass index is 54.62 kg/m.   Vital Signs:   Temp:  [98.3 F (36.8 C)-98.6 F (37 C)] 98.4 F (36.9 C) (01/29 1140) Pulse Rate:  [73-113] 94 (01/29 1200) Resp:  [13-25] 24 (01/29 1200) BP: (117-152)/(72-127) 147/86 (01/29 1200) SpO2:  [88 %-97 %] 88 % (01/29 1200) Weight:  [158.2 kg] 158.2 kg (01/29 0252) Last BM Date : 09/27/23 (per pt)  Weight change: Filed Weights   09/26/23 0500 09/28/23 0252  Weight: (!) 158 kg (!) 158.2 kg    Intake/Output:   Intake/Output Summary (Last 24 hours) at 09/28/2023 1251 Last data filed at 09/28/2023 0844 Gross per 24 hour  Intake 1311.9 ml  Output 1675 ml  Net -363.1 ml      Physical Exam    CVP 6-7 General:  morbidly obese, on supp O2 via [place] but no visible respiratory difficulty HEENT: normal Neck: thick neck; cannot visualize JVP. Cor: PMI nondisplaced. Regular rate & rhythm. No rubs, gallops or murmurs. + tunneled PICC left upper chest  Lungs: normal work of breathing.  Abdomen: obese, soft, nontender, nondistended. No hepatosplenomegaly. No bruits or masses. Good bowel sounds. Extremities:chronic venous stasis; +1 pitting edema.  Neuro: alert/oriented; no FND.     Telemetry   NSR>>ST 90s-low 100s personally reviewed   Labs    CBC Recent Labs    09/26/23 0510  WBC 7.9  HGB 14.8  HCT 45.4  MCV 93.4  PLT 265   Basic Metabolic Panel Recent Labs    09/81/19 0444 09/28/23 0422  NA 131* 132*  K 4.7 5.1  CL 97* 97*  CO2 24 24  GLUCOSE 152* 230*  BUN 52* 51*  CREATININE 1.73* 1.54*  CALCIUM 9.2 9.0    BNP (last 3 results)  Other results:   Imaging    No results  found.     Medications:     Scheduled Medications:  Chlorhexidine Gluconate Cloth  6 each Topical Daily   digoxin  0.0625 mg Oral Daily   methylPREDNISolone (SOLU-MEDROL) injection  40 mg Intravenous Q12H   sildenafil  10 mg Oral TID   sodium chloride flush  10-40 mL Intracatheter Q12H   Warfarin - Pharmacist Dosing Inpatient   Does not apply q1600    Infusions:  milrinone 0.25 mcg/kg/min (09/28/23 1139)    PRN Medications: acetaminophen, guaiFENesin-dextromethorphan, ipratropium-albuterol, nitroGLYCERIN, ondansetron (ZOFRAN) IV, mouth rinse, sodium chloride flush    Patient Profile   53 y.o. 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 Thrombectomy would be very high risk with his weight and current decompensation, could consider  balloon angioplasty -Continue on 0.25 milrinone; discussed with home infusion company. He is set up for it now.  -Continue Sildenafil 10 mg tid (reduced dose given hemoptysis, increased risk of pulmonary hemorrhage w/ this agent) -Working to get on riociguat (PharmD assisting)  -sCr continuing to improve. He should have riociguat this week. We will plan to start it inpatient. Discussed with pharmD.  -Weaned O2 to 3-4LPM; tolerating it well. Will continue to wean as able.  - sCr now 1.54.    2. Recurrent DVT/PE -MTHFR positive  with extensive thrombosis history -More recent admits 04/24 and 09/24 with PE treated with thrombectomy -Has hx of 2 IVC filters. The lower filter appeared occluded on prior angiography with multiple lower venous collaterals. Vascular surgery previously felt he was not a candidate for surgery. Suspect this has contributed to his lower extremity edema, chronic PE and "failure" of OAC. Difficult situation, even if CTEPH treated he would be at high risk for future clots and ongoing lower extremity swelling. -Chronic, stable PE on CTA this admit.  -Resumed Warfarin. INR therapeutic now; discussed with pharmD.    3. Atrial fibrillation/flutter -NSR today -Stopped beta blocker as above -on Warfarin    4. CKD IIIa -sCr 1.54 today. Doing well.    Length of Stay: 8  Nera Haworth, DO  09/28/2023, 12:51 PM  Advanced Heart Failure Team Pager [phone] (M-F; 7a - 5p)  Please contact [facility] for night-coverage after hours (5p -7a ) and weekends on [email or web address]

## 2023-09-29 DIAGNOSIS — I272 Pulmonary hypertension, unspecified: Secondary | ICD-10-CM | POA: Diagnosis not present

## 2023-09-29 DIAGNOSIS — I50810 Right heart failure, unspecified: Secondary | ICD-10-CM | POA: Diagnosis not present

## 2023-09-29 DIAGNOSIS — R0602 Shortness of breath: Secondary | ICD-10-CM | POA: Diagnosis not present

## 2023-09-29 LAB — CBC
HCT: 42.7 % (ref 39.0–52.0)
Hemoglobin: 14.3 g/dL (ref 13.0–17.0)
MCH: 30.7 pg (ref 26.0–34.0)
MCHC: 33.5 g/dL (ref 30.0–36.0)
MCV: 91.6 fL (ref 80.0–100.0)
Platelets: 287 10*3/uL (ref 150–400)
RBC: 4.66 MIL/uL (ref 4.22–5.81)
RDW: 14.4 % (ref 11.5–15.5)
WBC: 8.7 10*3/uL (ref 4.0–10.5)
nRBC: 0 % (ref 0.0–0.2)

## 2023-09-29 LAB — BASIC METABOLIC PANEL
Anion gap: 8 (ref 5–15)
BUN: 45 mg/dL — ABNORMAL HIGH (ref 6–20)
CO2: 24 mmol/L (ref 22–32)
Calcium: 8.9 mg/dL (ref 8.9–10.3)
Chloride: 100 mmol/L (ref 98–111)
Creatinine, Ser: 1.37 mg/dL — ABNORMAL HIGH (ref 0.61–1.24)
GFR, Estimated: >60 mL/min (ref >60)
Glucose, Bld: 207 mg/dL — ABNORMAL HIGH (ref 70–99)
Potassium: 4.9 mmol/L (ref 3.5–5.1)
Sodium: 132 mmol/L — ABNORMAL LOW (ref 135–145)

## 2023-09-29 LAB — PROTIME-INR
INR: 2.4 — ABNORMAL HIGH (ref 0.8–1.2)
Prothrombin Time: 26.2 s — ABNORMAL HIGH (ref 11.4–15.2)

## 2023-09-29 LAB — COOXEMETRY PANEL
Carboxyhemoglobin: 1.2 % (ref 0.5–1.5)
Methemoglobin: 1.1 % (ref 0.0–1.5)
O2 Saturation: 67.8 %
Total hemoglobin: 14.9 g/dL (ref 12.0–16.0)
Total oxygen content: 66.2 %

## 2023-09-29 LAB — GLUCOSE, CAPILLARY
Glucose-Capillary: 166 mg/dL — ABNORMAL HIGH (ref 70–99)
Glucose-Capillary: 151 mg/dL — ABNORMAL HIGH (ref 70–99)
Glucose-Capillary: 165 mg/dL — ABNORMAL HIGH (ref 70–99)
Glucose-Capillary: 175 mg/dL — ABNORMAL HIGH (ref 70–99)

## 2023-09-29 MED ORDER — WARFARIN SODIUM 5 MG PO TABS
5.0000 mg | ORAL_TABLET | Freq: Once | ORAL | Status: AC
  Administered 2023-09-29: 5 mg via ORAL
  Filled 2023-09-29: qty 1

## 2023-09-29 NOTE — Progress Notes (Signed)
ANTICOAGULATION CONSULT NOTE  Pharmacy Consult for warfarin  Indication: CTEPH / recurrent DVT / PE  Patient Measurements: Height: 5\' 7"  (170.2 cm) Weight: (!) 152.2 kg (335 lb 8.6 oz) IBW/kg (Calculated) : 66.1 Heparin Dosing Weight: 104.8 kg  Labs: Recent Labs    09/27/23 0444 09/28/23 0422 09/29/23 0350  HGB  --   --  14.3  HCT  --   --  42.7  PLT  --   --  287  LABPROT 23.2* 28.9* 26.2*  INR 2.0* 2.7* 2.4*  CREATININE 1.73* 1.54* 1.37*    Estimated Creatinine Clearance: 88.6 mL/min (A) (by C-G formula based on SCr of 1.37 mg/dL (H)).   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  Regular the workup has not been forthcoming) previously followed by Dr. Isaiah Serge at [facility]   Hepatic steatosis    Hypertension    Morbid obesity with BMI of 50.0-59.9, adult (HCC)    Oxygen deficiency    Peripheral vascular disease (HCC)    Presence of IVC filter    Has 2 IVC filters with significant thrombus burden superior to the filter.   Pulmonary embolus (HCC) 12/2016   Initially just treated with anticoagulation, but in February 2023 in April 2024 treated with EKOS thrombectomy for submassive PE.   Ventricular trigeminy    Weakness of both lower limbs     Assessment: Pt is a 53 yo male w/ hx of DVT/PE on warfarin (7.5mg  dose PTA, last dose 1/19 @ 2345) presenting to ED c/o SOB x 1.5 days found with elevated troponin I level trending up.  Pt has/had cardiac cath procedure scheduled for today at 0730. Hx: multiple DVTs/PES dating back to 76s. Per chart review, has MTHFR gene mutation and was  previously followed by Hematology (Dr. Isaiah Serge)   Last note from anticoagulation clinic on 09/15/23 noted INR goal 2.5-3 given recurrent thromboembolic events with INR < 2. Patient with supratherapeutic INRs on 1/16 and 1/17 so dose was decreased from 7.5 mg daily to 5 mg daily and patient was instructed to hold warfarin until 1/19.  Goal of Therapy:  INR 2.5 - 3 Monitor platelets by anticoagulation protocol: Yes  Warfarin Plan: --INR trending slightly subtherapeutic, trend is down slightly --will order warfarin 5 mg po x 1 today  --Will assess daily --INR daily and CBC per protocol  Lowella Bandy 09/29/2023 7:15 AM

## 2023-09-29 NOTE — Progress Notes (Signed)
PT Cancellation Note  Patient Details Name: Ryan [name] MRN: [id number] DOB: 10/18/1970   Cancelled Treatment:    Reason Eval/Treat Not Completed: Other (comment) (Patient with respiratory therapy. PT will continue with attempts as appropriate.)  Donna Bernard, PT, MPT  Ina Homes 09/29/2023, 3:22 PM

## 2023-09-29 NOTE — Progress Notes (Signed)
Progress Note   Patient: Ryan [name] ZOX:[id number] DOB: 03/17/1971 DOA: 09/20/2023     9 DOS: the patient was seen and examined on 09/29/2023   Brief hospital course: Ryan [name] is a 53 y.o. 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   Patient is admitted for further management evaluation of acute dyspnea, elevated troponin.  He is evaluated by heart failure team for severe right heart failure, pulmonary hypertension. Patient got right heart cath which showed severely elevated PA pressures and reduced cardiac index.  Patient is started on milrinone gtt., sildenafil therapy.  He is continued on digoxin, spironolactone therapy.  Patient is initially was on 4 L supplemental oxygen, later developed significant hemoptysis has heparin, Coumadin held.  O2 weaned from 8 to 10 L to 2-3 L supplemental oxygen.  Sildenafil dose decreased to 10 mg, heart failure team advised Adempas (riociguat) to be started once it is delivered to his home.  Assessment and Plan: * Acute on chronic diastolic CHF Acute hypoxic respiratory failure Severe pulmonary hypertension History of prior pulmonary embolism CTA with chronic pulmonary embolism.  Patient follows vascular team and had thrombectomy done last November. Cardiology evaluated the patient recommended right heart cath which showed severely elevated PA pressures and reduced cardiac index.   09/20/23 Patient is started on IV milrinone drip, IV lasix and sildenafil therapy.   09/22/23 tunneled right internal jugular CVC placement for milrinone gtt. Heparin held 09/23/23 due to hemoptysis. 09/24/23 Coumadin held due to persistent hemoptysis,  around blood in cannister since night.  09/25/23 oxygen requirement worsened, does have persistent hemoptysis Coumadin on hold, pulmonary started bronchodilators, steroids.  Sildenafil dose decreased to 10mg  per Cardiology given hemoptysis. 09/26/23 Heart failure team stopped spironolactone, agreed with lowering Sildenafil, advised Adempas (riociguat) once available (through home delivery), RHC for worsening symptoms.  His kidney function worsened.  Held Lasix, spironolactone.  Restarted Coumadin. 09/27/23-hemoptysis much improved, he is on 5 to 6 L supplemental oxygen. 09/28/23- feel better. Continues milrinone gtt. On 3-4L supplemental o2. 09/29/23 oxygen down to 2.5L, waiting on Riociguat delivered today. Continue digoxin therapy. Continue to hold metoprolol, losartan due to borderline low blood pressures. Heart failure team advised [facility] vascular team for pulmonary angiography once more medically stable upon discharge.  Type 2 diabetes mellitus with diabetic chronic kidney disease (HCC) A1c 6.1. He is eating fair. Hypoglycemia protocol  Essential hypertension Afib -  BP borderline low. Continue telemetry, he is NSR. Caution with antihypertensives.  Acute on CKD (chronic kidney disease), stage IIIa Renal function improved with diuresis held. Avoid other nephrotoxic drugs. Monitor daily renal function.  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 INR 2.7 today.  Morbid obesity with BMI of 50.0-59.9, adult (HCC) Complicating factor to overall prognosis and care. Diet, exercise and weight reduction advised.  Recurrent pulmonary embolism (HCC), DVT Causing elevated pulmonary pressures Chronic DVT s/p IVC. Further management as outpatient. He would benefit from pulmonary angiography at a later  time once more medically stable.    Out of bed to chair. Incentive spirometry.  Nursing supportive care. Fall, aspiration precautions. DVT prophylaxis   Code Status: Full Code  Subjective: Patient is seen and examined today morning.  He is sitting in chair.  Currently on 200 L supplemental oxygen.  No hemoptysis. Eating fair. Hopes to go home tomorrow.  Physical Exam: Vitals:   09/29/23 1100 09/29/23 1200 09/29/23 1247 09/29/23 1300  BP: (!) 145/76 (!) 137/93  138/79  Pulse: 95 78 88 88  Resp: (!) 23 19 19    Temp:   98 F (36.7 C)   TempSrc:   Oral   SpO2: 93% 93% 93% 90%  Weight:      Height:        General - Middle aged morbidly obese African-American male, out of bed, no respiratory distress HEENT - PERRLA, EOMI, atraumatic head, non tender sinuses. Lung -distant breath sounds, diffuse rales, rhonchi, no wheezes. Heart - S1, S2 heard, no murmurs, rubs, chronic pedal edema. Abdomen - Soft, non tender obese, bowel sounds good Neuro - Alert, awake and oriented x 3, non focal exam. Skin - Warm and dry.  Data Reviewed:      Latest Ref Rng & Units 09/29/2023    3:50 AM 09/26/2023    5:10 AM 09/25/2023    4:28 AM  CBC  WBC 4.0 - 10.5 K/uL 8.7  7.9  4.8   Hemoglobin 13.0 - 17.0 g/dL 11.9  14.7  82.9   Hematocrit 39.0 - 52.0 % 42.7  45.4  44.5   Platelets 150 - 400 K/uL 287  265  226       Latest Ref Rng & Units 09/29/2023    3:50 AM 09/28/2023    4:22 AM 09/27/2023    4:44 AM  BMP  Glucose 70 - 99 mg/dL 562  130  865   BUN 6 - 20 mg/dL 45  51  52   Creatinine 0.61 - 1.24 mg/dL 7.84  6.96  2.95   Sodium 135 - 145 mmol/L 132  132  131   Potassium 3.5 - 5.1 mmol/L 4.9  5.1  4.7   Chloride 98 - 111 mmol/L 100  97  97   CO2 22 - 32 mmol/L 24  24  24    Calcium 8.9 - 10.3 mg/dL 8.9  9.0  9.2    No results found.   Family Communication: Discussed with patient, he understand and agree. All questions answereed.  Disposition: Status is: Inpatient Remains inpatient  appropriate because: IV milrinone gtt, 2-3L oxygen, improved hemoptysis.  Planned Discharge Destination: Home with Home Health     MDM level 3-patient is hypoxic requiring 2-3 L supplemental oxygen, has hemoptysis, with severe pulmonary hypertension.  Patient is on milrinone drip, Coumadin therapy.  Patient will need hemodynamic, electrolyte, telemetry close monitoring in the unit.  Patient is at high risk for sudden clinical deterioration.  Author: Marcelino Duster, MD 09/29/2023 2:34 PM Secure chat 7am to 7pm For on call review [email or web address]

## 2023-09-29 NOTE — Progress Notes (Signed)
Advanced Heart Failure Rounding Note  HF Cardiologist: Dr. 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 Dyspnea much improved. No further hemoptysis. Down to 3L [place], O2 sats 94%.   CVP 3. Co-ox 68% on 0.25 of milrinone.  SCr 1.54>>1.37.   Still waiting on Adempas delivery.    Objective:   Weight Range: (!) 152.2 kg Body mass index is 52.55 kg/m.   Vital Signs:   Temp:  [97.5 F (36.4 C)-98.6 F (37 C)] 97.5 F (36.4 C) (01/30 0200) Pulse Rate:  [70-100] 70 (01/30 0700) Resp:  [13-24] 17 (01/30 0700) BP: (87-152)/(59-102) 142/102 (01/30 0700) SpO2:  [88 %-95 %] 91 % (01/30 0700) Weight:  [152.2 kg] 152.2 kg (01/30 0115) Last BM Date : 09/27/23  Weight change: Filed Weights   09/26/23 0500 09/28/23 0252 09/29/23 0115  Weight: (!) 158 kg (!) 158.2 kg (!) 152.2 kg    Intake/Output:   Intake/Output Summary (Last 24 hours) at 09/29/2023 0753 Last data filed at 09/29/2023 0400 Gross per 24 hour  Intake 845.26 ml  Output 1175 ml  Net -329.74 ml      Physical Exam    CVP 3  General:  obese, No respiratory difficulty HEENT: normal Neck: supple. Thick neck, JVD not well visualized. Carotids 2+ bilat; no bruits. No lymphadenopathy or thyromegaly appreciated. Cor: PMI nondisplaced. Regular rate & rhythm. No rubs, gallops or murmurs. Tunneled PICC rt upper chest  Lungs: clear Abdomen: obese, soft, nontender, nondistended. No hepatosplenomegaly. No bruits or masses. Good bowel sounds. Extremities: no cyanosis, clubbing, rash, edema Neuro: alert & oriented x 3, cranial nerves grossly intact. moves all 4 extremities w/o difficulty. Affect pleasant.   Telemetry   NSR 80s, personally reviewed   Labs    CBC Recent Labs    09/29/23 0350  WBC 8.7  HGB 14.3  HCT 42.7  MCV 91.6  PLT 287   Basic Metabolic Panel Recent Labs    16/10/96 0422 09/29/23 0350  NA 132* 132*  K 5.1 4.9  CL 97* 100  CO2 24  24  GLUCOSE 230* 207*  BUN 51* 45*  CREATININE 1.54* 1.37*  CALCIUM 9.0 8.9    BNP (last 3 results)  Other results:   Imaging    No results found.     Medications:     Scheduled Medications:  Chlorhexidine Gluconate Cloth  6 each Topical Daily   digoxin  0.0625 mg Oral Daily   methylPREDNISolone (SOLU-MEDROL) injection  40 mg Intravenous Daily   sildenafil  10 mg Oral TID   sodium chloride flush  10-40 mL Intracatheter Q12H   Warfarin - Pharmacist Dosing Inpatient   Does not apply q1600    Infusions:  milrinone 0.25 mcg/kg/min (09/29/23 0400)    PRN Medications: acetaminophen, guaiFENesin-dextromethorphan, ipratropium-albuterol, nitroGLYCERIN, ondansetron (ZOFRAN) IV, mouth rinse, sodium chloride flush    Patient Profile   53 y.o. 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 Thrombectomy would be very high risk with his weight and current decompensation, could consider balloon angioplasty -Continue on 0.25 milrinone; discussed with home infusion company. He is set up for it now.  -Continue Sildenafil 10 mg tid (reduced dose given hemoptysis, increased risk of pulmonary hemorrhage w/ this agent) -Working to get on riociguat (PharmD assisting). Should get delivered today. Will plan to start inpatient   -sCr continuing to improve, 1.54>>1.37 today.     2. 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 The lower filter appeared occluded on prior angiography with multiple lower venous collaterals. Vascular surgery previously felt he was not a candidate for surgery. Suspect this has contributed to his lower extremity edema, chronic PE and "failure" of OAC. Difficult situation, even if CTEPH treated he would be at high risk for future clots and ongoing lower extremity swelling. -Chronic, stable PE on CTA this admit.  -Resumed Warfarin. INR therapeutic now, 2.4   3. Atrial fibrillation/flutter -Maintaining NSR  -Stopped beta blocker as above -on Warfarin w/ therapeutic INR     4. AKI on CKD IIIa -Scr peaked to 1.98 this admit - now improved, 1.37 today    Length of Stay: [address], PA-C  09/29/2023, 7:53 AM  Advanced Heart Failure Team Pager [phone] (M-F; 7a - 5p)  Please contact [facility] for night-coverage after hours (5p -7a ) and weekends on [email or web address]

## 2023-09-30 ENCOUNTER — Other Ambulatory Visit (HOSPITAL_COMMUNITY): Payer: Self-pay

## 2023-09-30 ENCOUNTER — Telehealth (HOSPITAL_COMMUNITY): Payer: Self-pay | Admitting: Pharmacy Technician

## 2023-09-30 DIAGNOSIS — R0602 Shortness of breath: Secondary | ICD-10-CM | POA: Diagnosis not present

## 2023-09-30 DIAGNOSIS — R791 Abnormal coagulation profile: Secondary | ICD-10-CM | POA: Diagnosis not present

## 2023-09-30 DIAGNOSIS — R570 Cardiogenic shock: Secondary | ICD-10-CM | POA: Diagnosis not present

## 2023-09-30 DIAGNOSIS — I825Z3 Chronic embolism and thrombosis of unspecified deep veins of distal lower extremity, bilateral: Secondary | ICD-10-CM | POA: Diagnosis not present

## 2023-09-30 LAB — PROTIME-INR
INR: 2.2 — ABNORMAL HIGH (ref 0.8–1.2)
Prothrombin Time: 24.3 s — ABNORMAL HIGH (ref 11.4–15.2)

## 2023-09-30 LAB — COOXEMETRY PANEL
Carboxyhemoglobin: 1.3 % (ref 0.5–1.5)
Methemoglobin: <0.7 % (ref 0.0–1.5)
O2 Saturation: 64.2 %
Total hemoglobin: 15.2 g/dL (ref 12.0–16.0)
Total oxygen content: 63.1 %

## 2023-09-30 LAB — BASIC METABOLIC PANEL
Anion gap: 8 (ref 5–15)
BUN: 41 mg/dL — ABNORMAL HIGH (ref 6–20)
CO2: 25 mmol/L (ref 22–32)
Calcium: 8.8 mg/dL — ABNORMAL LOW (ref 8.9–10.3)
Chloride: 101 mmol/L (ref 98–111)
Creatinine, Ser: 1.32 mg/dL — ABNORMAL HIGH (ref 0.61–1.24)
GFR, Estimated: >60 mL/min (ref >60)
Glucose, Bld: 170 mg/dL — ABNORMAL HIGH (ref 70–99)
Potassium: 4.8 mmol/L (ref 3.5–5.1)
Sodium: 134 mmol/L — ABNORMAL LOW (ref 135–145)

## 2023-09-30 LAB — GLUCOSE, CAPILLARY
Glucose-Capillary: 121 mg/dL — ABNORMAL HIGH (ref 70–99)
Glucose-Capillary: 167 mg/dL — ABNORMAL HIGH (ref 70–99)

## 2023-09-30 MED ORDER — TORSEMIDE 20 MG PO TABS: 20.0000 mg | ORAL_TABLET | Freq: Every day | ORAL | 3 refills | Status: DC

## 2023-09-30 MED ORDER — MORPHINE SULFATE (PF) 2 MG/ML IV SOLN
INTRAVENOUS | Status: AC
  Filled 2023-09-30: qty 1

## 2023-09-30 MED ORDER — WARFARIN SODIUM 7.5 MG PO TABS
7.5000 mg | ORAL_TABLET | Freq: Once | ORAL | Status: AC
  Administered 2023-09-30: 7.5 mg via ORAL
  Filled 2023-09-30: qty 1

## 2023-09-30 MED ORDER — GUAIFENESIN-DM 100-10 MG/5ML PO SYRP: 5.0000 mL | ORAL_SOLUTION | ORAL | 0 refills | Status: DC | PRN

## 2023-09-30 MED ORDER — MILRINONE LACTATE IN DEXTROSE 20-5 MG/100ML-% IV SOLN: 0.2500 ug/kg/min | INTRAVENOUS | Status: DC

## 2023-09-30 MED ORDER — RIOCIGUAT 1 MG PO TABS
1.0000 mg | ORAL_TABLET | Freq: Three times a day (TID) | ORAL | Status: DC
  Administered 2023-09-30 (×2): 1 mg via ORAL
  Filled 2023-09-30: qty 1

## 2023-09-30 NOTE — Progress Notes (Signed)
[name]  A and O x 4. VSS. Pt tolerating diet well. No complaints of pain or nausea. IV removed intact, prescriptions given. Pt voiced understanding of discharge instructions with no further questions. Pt discharged via wheelchair.   Ryan [name]

## 2023-09-30 NOTE — Progress Notes (Signed)
Pt is being discharge with right chest double lumen PICC line for home iv infusions.

## 2023-09-30 NOTE — Plan of Care (Addendum)
Pt is alert and oriented x4, he walk around the nursing station on RA with his oxygen sat at 91%. Pt is currently waiting on home health to come and set his iv infusions and can be discharge.  Problem: Education: Goal: Ability to describe self-care measures that may prevent or decrease complications (Diabetes Survival Skills Education) will improve Outcome: Progressing   Problem: Coping: Goal: Ability to adjust to condition or change in health will improve Outcome: Progressing   Problem: Fluid Volume: Goal: Ability to maintain a balanced intake and output will improve Outcome: Progressing   Problem: Health Behavior/Discharge Planning: Goal: Ability to identify and utilize available resources and services will improve Outcome: Progressing Goal: Ability to manage health-related needs will improve Outcome: Progressing   Problem: Nutritional: Goal: Maintenance of adequate nutrition will improve Outcome: Progressing Goal: Progress toward achieving an optimal weight will improve Outcome: Progressing   Problem: Skin Integrity: Goal: Risk for impaired skin integrity will decrease Outcome: Progressing   Problem: Tissue Perfusion: Goal: Adequacy of tissue perfusion will improve Outcome: Progressing   Problem: Education: Goal: Understanding of cardiac disease, CV risk reduction, and recovery process will improve Outcome: Progressing   Problem: Activity: Goal: Ability to tolerate increased activity will improve Outcome: Progressing   Problem: Education: Goal: Understanding of CV disease, CV risk reduction, and recovery process will improve Outcome: Progressing   Problem: Activity: Goal: Ability to return to baseline activity level will improve Outcome: Progressing   Problem: Education: Goal: Ability to describe self-care measures that may prevent or decrease complications (Diabetes Survival Skills Education) will improve Outcome: Progressing   Problem: Coping: Goal: Ability  to adjust to condition or change in health will improve Outcome: Progressing   Problem: Coping: Goal: Ability to adjust to condition or change in health will improve Outcome: Progressing   Problem: Education: Goal: Ability to describe self-care measures that may prevent or decrease complications (Diabetes Survival Skills Education) will improve Outcome: Progressing

## 2023-09-30 NOTE — Progress Notes (Signed)
  Patient Saturations on Room Air at Rest = 90%  Patient Saturations on [employer] while Ambulating = 91%

## 2023-09-30 NOTE — TOC Progression Note (Signed)
Transition of Care [facility]) - Progression Note    Patient Details  Name: Ryan [name] MRN: [id number] Date of Birth: [birth year]  Transition of [facility]) CM/SW Contact  Margarito Liner, LCSW Phone Number: 09/30/2023, 11:49 AM  Clinical Narrative:  CSW notified [employer] that patient will be discharging home today so that they can coordinate delivering INR checking device. CSW asked RN for sats test to determine if he needs continuous oxygen at discharge.   Expected Discharge Plan: Home w Home Health Services Barriers to Discharge: Continued Medical Work up  Expected Discharge Plan and Services     Post Acute Care Choice: Home Health Living arrangements for the past 2 months: Single Family Home Expected Discharge Date: 09/30/23                                     Social Determinants of Health (SDOH) Interventions SDOH Screenings   Food Insecurity: Patient Declined (09/20/2023)  Housing: Patient Declined (09/20/2023)  Transportation Needs: Patient Declined (09/20/2023)  Utilities: Patient Declined (09/20/2023)  Depression (PHQ2-9): Low Risk  (08/18/2023)  Financial Resource Strain: Low Risk  (08/18/2023)  Physical Activity: Insufficiently Active (08/18/2023)  Social Connections: Moderately Integrated (08/18/2023)  Stress: No Stress Concern Present (08/18/2023)  Tobacco Use: Low Risk  (09/20/2023)    Readmission Risk Interventions    09/23/2023    4:32 PM  Readmission Risk Prevention Plan  Transportation Screening Complete  PCP or Specialist Appt within 3-5 Days Complete  HRI or Home Care Consult Complete  Social Work Consult for Recovery Care Planning/Counseling Complete  Palliative Care Screening Not Applicable  Medication Review Oceanographer) Complete

## 2023-09-30 NOTE — Telephone Encounter (Signed)
Patient Product/process development scientist completed.    The patient is insured through [facility]. Patient has [employer], may use a copay card, and/or apply for patient assistance if available.    Ran test claim for Jardiance 10 mg and the current 30 day co-pay is $40.00.  Ran test claim for Farxiga 10 mg and Product Not Covered  This test claim was processed through [employer]- copay amounts may vary at other pharmacies due to [employer], or as the patient moves through the different stages of their insurance plan.     Roland Earl, CPHT Pharmacy Technician III Certified Patient Advocate [facility] Patient Advocate Team Direct Number: [phone]  Fax: [phone]

## 2023-09-30 NOTE — Progress Notes (Signed)
Advanced Heart Failure Rounding Note  HF Cardiologist: Dr. Elwyn Lade Chief Complaint: Dyspnea 2/2 Pulmonary hypertension & RV failure  Subjective:    - Received adempas and took first dose today.  - Ambulating further distances now. O2 weaned to 3L Ryan [name], sats 92%.  - Reports that he overall feels well; no complaints today.    Objective:   Weight Range: (!) 151.2 kg Body mass index is 52.21 kg/m.   Vital Signs:   Temp:  [97.8 F (36.6 C)-98.2 F (36.8 C)] 97.8 F (36.6 C) (01/31 0730) Pulse Rate:  [65-95] 88 (01/31 0900) Resp:  [13-23] 18 (01/31 0900) BP: (124-158)/(76-106) 137/89 (01/31 0900) SpO2:  [88 %-97 %] 92 % (01/31 0900) Weight:  [151.2 kg] 151.2 kg (01/31 0420) Last BM Date : 09/29/23  Weight change: Filed Weights   09/28/23 0252 09/29/23 0115 09/30/23 0420  Weight: (!) 158.2 kg (!) 152.2 kg (!) 151.2 kg    Intake/Output:   Intake/Output Summary (Last 24 hours) at 09/30/2023 0955 Last data filed at 09/30/2023 0743 Gross per 24 hour  Intake 1225.88 ml  Output 1775 ml  Net -549.12 ml      Physical Exam    General:  obese, No respiratory difficulty HEENT: NCAT Neck: thick Cor: RRR; no murmurs Tunneled PICC rt upper chest  Lungs: normal work of breathing Abdomen: obese; soft, nontender Extremities: chronic venous stasis Neuro:AAOX3; moving all extremities; no FND   Telemetry   NSR 90s  Labs    CBC Recent Labs    09/29/23 0350  WBC 8.7  HGB 14.3  HCT 42.7  MCV 91.6  PLT 287   Basic Metabolic Panel Recent Labs    16/10/96 0350 09/30/23 0416  NA 132* 134*  K 4.9 4.8  CL 100 101  CO2 24 25  GLUCOSE 207* 170*  BUN 45* 41*  CREATININE 1.37* 1.32*  CALCIUM 8.9 8.8*    BNP (last 3 results)  Other results:   Imaging    No results found.     Medications:     Scheduled Medications:  Chlorhexidine Gluconate Cloth  6 each Topical Daily   digoxin  0.0625 mg Oral Daily   methylPREDNISolone (SOLU-MEDROL) injection  40 mg  Intravenous Daily   morphine (PF)       Riociguat  1 mg Oral TID   sodium chloride flush  10-40 mL Intracatheter Q12H   Warfarin - Pharmacist Dosing Inpatient   Does not apply q1600    Infusions:  milrinone 0.25 mcg/kg/min (09/30/23 0428)    PRN Medications: acetaminophen, guaiFENesin-dextromethorphan, ipratropium-albuterol, morphine (PF), nitroGLYCERIN, ondansetron (ZOFRAN) IV, mouth rinse, sodium chloride flush    Patient Profile   53 y.o. 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 Thrombectomy would be very high risk with his weight and current decompensation, could consider balloon angioplasty -Continue on 0.25 milrinone; mixed venous 64% today. Discussed with home infusion company. He is set up for  it now.  -D/C sildenafil, discussed with pharmD.  -Adempas delivered today; took first dose. Will continue at home and follow outpatient uptitration protocol.  -discharge home tomorrow on torsemide 20mg  daily, adempas and milrionne 0.37mcg/kg/min.    2. 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 The lower filter appeared occluded on prior angiography with multiple lower venous collaterals. Vascular surgery previously felt he was not a candidate  for surgery. Suspect this has contributed to his lower extremity edema, chronic PE and "failure" of OAC. Difficult situation, even if CTEPH treated he would be at high risk for future clots and ongoing lower extremity swelling. -Chronic, stable PE on CTA this admit.  -Resumed Warfarin. INR therapeutic now, 2.2   3. Atrial fibrillation/flutter -Maintaining NSR  -Stopped beta blocker as above -on Warfarin w/ therapeutic INR     4. AKI on CKD IIIa -Scr peaked to 1.98 this admit -significantly improved; 1.3 today.    Length of Stay: 10  Dorthula Nettles, DO  09/30/2023, 9:55 AM  Advanced Heart Failure Team Pager [phone] (M-F; 7a - 5p)  Please contact [facility] for night-coverage after hours (5p -7a ) and weekends on [email or web address]

## 2023-09-30 NOTE — Discharge Summary (Signed)
Physician Discharge Summary   Patient: Ryan [name] MRN: [id number] DOB: [birth year]  Admit date:     09/20/2023  Discharge date: 09/30/23  Discharge Physician: Marcelino Duster   PCP: Sherlene Shams, MD   Recommendations at discharge:    PCP follow up in 1 week. Heart failure follow up as scheduled. Home health for milrinone drip.  Discharge Diagnoses: Principal Problem:   Shortness of breath Active Problems:   Chronic diastolic (congestive) heart failure (HCC)   Essential hypertension   Type 2 diabetes mellitus with diabetic chronic kidney disease (HCC)   CKD (chronic kidney disease), stage IIIa   Chronic pulmonary embolism (HCC)   Morbid obesity with BMI of 50.0-59.9, adult (HCC)   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)   Elevated troponin   S/P insertion of IVC (inferior vena caval) filter   Diabetes mellitus treated with injections of non-insulin medication (HCC)   Right ventricular failure (HCC)   Subtherapeutic international normalized ratio (INR)   Cardiogenic shock (HCC)   HFrEF (heart failure with reduced ejection fraction) (HCC)  Resolved Problems:   * No resolved hospital problems. *  Hospital Course: Ryan [name] is a 53 y.o. 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   Patient is admitted for further management evaluation of acute dyspnea, elevated troponin.  He is evaluated by heart failure team for severe right heart failure, pulmonary hypertension. Patient got right heart cath which showed severely elevated PA pressures and reduced cardiac index.  Patient is started on milrinone gtt., sildenafil therapy.  He  is continued on digoxin, spironolactone therapy.  Patient is initially on 4 L supplemental oxygen, later developed significant hemoptysis has heparin, Coumadin held.  O2 weaned from 8 to 10 L to 2-3 L supplemental oxygen.  Heart failure team arranged home milrinone services. Sildenafil stopped today and Adempas (riociguat) started. Advised to restart torsemide 20mg  daily. He is hemodynamically stable to be discharged home with [facility] services for home milrinone drip. He is advised to follow up with PCP, heart failure clinic upon discharge as instructed.  Assessment and Plan: * Acute on chronic diastolic CHF Acute hypoxic respiratory failure Severe pulmonary hypertension History of prior pulmonary embolism CTA with chronic pulmonary embolism.  Patient follows vascular team and had thrombectomy done last November. Cardiology evaluated the patient recommended right heart cath which showed severely elevated PA pressures and reduced cardiac index.   09/20/23 Patient is started on IV milrinone drip, IV lasix and sildenafil therapy.   09/22/23 tunneled right internal jugular CVC placement for milrinone gtt. Heparin held 09/23/23 due to hemoptysis. 09/24/23 Coumadin held due to persistent hemoptysis, around blood in cannister since night.  09/25/23 oxygen requirement worsened, does have persistent hemoptysis Coumadin on hold, pulmonary started bronchodilators, steroids.  Sildenafil dose decreased to 10mg  per Cardiology given hemoptysis. 09/26/23 Heart failure team stopped spironolactone, agreed with lowering Sildenafil, advised Adempas (riociguat) once available (through home delivery), [facility] for worsening symptoms.  His kidney function worsened.  Held Lasix, spironolactone.  Restarted Coumadin. 09/27/23-hemoptysis much improved, he is on 5 to 6 L supplemental oxygen. 09/28/23- feel better. Continues milrinone gtt. On 3-4L supplemental o2. 09/29/23 oxygen down  to 2L, waiting on Riociguat delivered today. 09/30/23 HH  set up for Milrinone. Heart failure team advised to discharge him on 20mg  Torsemide, Riociguat therapy which is started today. Continue digoxin therapy. Continue to hold metoprolol, losartan due to borderline low blood pressures. Heart failure team advised [facility] vascular team for pulmonary angiography once more medically stable upon discharge.   Type 2 diabetes mellitus with diabetic chronic kidney disease (HCC) A1c 6.1. He is eating fair. Hypoglycemia protocol.   Essential hypertension Afib -  BP borderline low. Continue telemetry, he is NSR. Caution with antihypertensives.   Acute on CKD (chronic kidney disease), stage IIIa Renal function improved with diuresis held. Avoid other nephrotoxic drugs. Monitor daily renal function. 2mg  Torsemide to restarted upon discharge.   Subtherapeutic international normalized ratio (INR) Chronic pulmonary embolism/chronic DVT s/p IVC filter Stopped heparin drip due to significant hemoptysis Continue Coumadin therapy and follow INR clinic.   Morbid obesity with BMI of 50.0-59.9, adult (HCC) Complicating factor to overall prognosis and care. Diet, exercise and weight reduction advised.   Recurrent pulmonary embolism (HCC), DVT Causing elevated pulmonary pressures Chronic DVT s/p IVC. Further management as outpatient. He would benefit from pulmonary angiography at a later time once more medically stable.       Consultants: Heart failure team Procedures performed: Right heart cath  Disposition: Home health Diet recommendation:  Discharge Diet Orders (From admission, onward)     Start     Ordered   09/30/23 0000  Diet - low sodium heart healthy        09/30/23 1131           Cardiac and Carb modified diet DISCHARGE MEDICATION: Allergies as of 09/30/2023       Reactions   Jardiance [empagliflozin]    Caused infection    Metrizamide Other (See Comments)   Kidney failure requiring dialysis   Clindamycin/lincomycin Rash    Lincomycin Rash   Sulfa Antibiotics Rash        Medication List     STOP taking these medications    losartan 25 MG tablet Commonly known as: COZAAR   metoprolol succinate 50 MG 24 hr tablet Commonly known as: TOPROL-XL   OXYGEN   spironolactone 25 MG tablet Commonly known as: ALDACTONE       TAKE these medications    Adempas 1 MG Tabs Generic drug: Riociguat Take 1 tablet by mouth 3 (three) times daily.   albuterol 108 (90 Base) MCG/ACT inhaler Commonly known as: VENTOLIN HFA Inhale 2 puffs into the lungs every 6 (six) hours as needed for wheezing or shortness of breath.   Digoxin 62.5 MCG Tabs Take 0.0625 mg by mouth daily.   guaiFENesin-dextromethorphan 100-10 MG/5ML syrup Commonly known as: ROBITUSSIN DM Take 5 mLs by mouth every 4 (four) hours as needed for cough.   milrinone 20 MG/100 ML Soln infusion Commonly known as: PRIMACOR Inject 0.0413 mg/min into the vein continuous.   Mounjaro 7.5 MG/0.5ML Pen Generic drug: tirzepatide Inject 7.5 mg into the skin once a week.   multivitamin with minerals Tabs tablet Take 1 tablet by mouth daily.   ondansetron 4 MG tablet Commonly known as: Zofran Take 1 tablet (4 mg total) by mouth every 8 (eight) hours as needed for nausea or vomiting.   torsemide 20 MG tablet Commonly known as: DEMADEX Take 1 tablet (20 mg total) by mouth daily.   warfarin 5 MG tablet Commonly known as: COUMADIN Take as directed. If you are unsure how to take  this medication, talk to your nurse or doctor. Original instructions: Take 1.5 tablets (7.5 mg total) by mouth daily.               Durable Medical Equipment  (From admission, onward)           Start     Ordered   09/30/23 0000  For home use only DME oxygen       Question Answer Comment  Length of Need Lifetime   Mode or (Route) Nasal cannula   Liters per Minute 2   Frequency Continuous (stationary and portable oxygen unit needed)   Oxygen conserving device  Yes   Oxygen delivery system Gas      09/30/23 1131   09/29/23 1636  Heart failure home health orders  (Heart failure home health orders / Face to face)  Once       Comments: Heart Failure Follow-up Care:  Verify follow-up appointments per Patient Discharge Instructions. Confirm transportation arranged. Reconcile home medications with discharge medication list. Remove discontinued medications from use. Assist patient/caregiver to manage medications using pill box. Reinforce low sodium food selection Assessments: Vital signs and oxygen saturation at each visit. Assess home environment for safety concerns, caregiver support and availability of low-sodium foods. Consult Child psychotherapist, PT/OT, Dietitian, and CNA based on assessments. Perform comprehensive cardiopulmonary assessment. Notify MD for any change in condition or weight gain of 3 pounds in one day or 5 pounds in one week with symptoms. Daily Weights and Symptom Monitoring: Ensure patient has access to scales. Teach patient/caregiver to weigh daily before breakfast and after voiding using same scale and record.    Teach patient/caregiver to track weight and symptoms and when to notify Provider. Activity: Develop individualized activity plan with patient/caregiver.   Home Paraenteral Inotropic Therapy : Data Collection Form  Patients name: Ryan [name]   Date: 09/29/23  Information below may not be completed by the supplier nor anyone in a Financial relationship with the supplier.  NYHA IV  1. Results of invasive hemodynamic monitoring  Cardiac Index Before Inotrope infusion: 1.21  L/min/m                           On Inotrope infusion:             2.39  L/min/m             Drug and dose:   Milrinone 0.25 mcg/kg/min  2. Cardiac medications immediately prior to inotrope infusion (List name, dose, and frequency)    3. Dose this represent maximum tolerated doses of these medications? Yes.   4. Breathing status Prior to  inotrope infusion: Dyspnea at rest  At time of discharge: Dyspnea on moderate exertion.   5. Initial home prescription Drug and Dose:   Milrinone 0.25 mcg/kg/min for continuous infusion 24/hr day and 7 days/week  6. If continuous infusion is prescribed, have attempts to discontinue inotrope infusion in the hospital failed?   Yes.   7. If intermittent infusion is prescribed, have there been repeated hospitalizations for heart failure which Parenteral inotrope were required? Not applicable.   8. Is patient capable of going to the physician for outpatient evaluation? Yes.    9. Is routine electrocardiographic monitoring required in the Home?  No.   The above statements and any additional explanations included separately are true and accurate and there is documentation present in the patients medical record to support these statements.   Completed by  Robbie Lis, PA-C   In instances where this form was completed by an Systems developer, please see EMR for physician Co-Signature.   [facility] to provide  Labs every other week to include BMET, Mg, and CBC with Diff. Additional as needed. Should be drawn via PERIPHERAL stick. NOT PICC line.   U9811 Milrinone 0.25 mcg/kg/min X 52 weeks A4221 Supplies for maintenance of drug infusion catheter A4222 Supplies for the external drug infusion per cassette or bag E0781 Ambulatory Infusion pump  Question Answer Comment  Heart Failure Follow-up Care [facility] at [phone]   Obtain the following labs Basic Metabolic Panel   Lab frequency Weekly   Fax lab results to [facility] Clinic at [phone]   Diet Low Sodium Heart Healthy   Fluid restrictions: 1800 mL Fluid      09/29/23 1642   09/22/23 0941  Heart failure home health orders  (Heart failure home health orders / Face to face)  Once       Comments: Heart Failure Follow-up Care:  Verify follow-up appointments per Patient Discharge Instructions. Confirm  transportation arranged. Reconcile home medications with discharge medication list. Remove discontinued medications from use. Assist patient/caregiver to manage medications using pill box. Reinforce low sodium food selection Assessments: Vital signs and oxygen saturation at each visit. Assess home environment for safety concerns, caregiver support and availability of low-sodium foods. Consult Child psychotherapist, PT/OT, Dietitian, and CNA based on assessments. Perform comprehensive cardiopulmonary assessment. Notify MD for any change in condition or weight gain of 3 pounds in one day or 5 pounds in one week with symptoms. Daily Weights and Symptom Monitoring: Ensure patient has access to scales. Teach patient/caregiver to weigh daily before breakfast and after voiding using same scale and record.    Teach patient/caregiver to track weight and symptoms and when to notify Provider. Activity: Develop individualized activity plan with patient/caregiver. [facility] to provide  Labs every other week to include BMET, Mg, and CBC with Diff. Additional as needed. Should be drawn via PERIPHERAL stick. NOT PICC line.   N6295 Milrinone 0.25 mcg/kg/min X 52 weeks A4221 Supplies for maintenance of drug infusion catheter A4222 Supplies for the external drug infusion per cassette or bag E0781 Ambulatory Infusion pump  Question Answer Comment  Heart Failure Follow-up Care [facility] at [phone]   Obtain the following labs Other see comments   Lab frequency Other see comments   Fax lab results to [facility] at [phone]   Diet Low Sodium Heart Healthy   Fluid restrictions: 1500 mL Fluid      09/22/23 0947              Discharge Care Instructions  (From admission, onward)           Start     Ordered   09/30/23 0000  Leave dressing on - Keep it clean, dry, and intact until clinic visit        09/30/23 1131            Discharge Exam: Filed Weights   09/28/23  0252 09/29/23 0115 09/30/23 0420  Weight: (!) 158.2 kg (!) 152.2 kg (!) 151.2 kg   General - Middle aged morbidly obese African-American male, out of bed, no respiratory distress HEENT - PERRLA, EOMI, atraumatic head, non tender sinuses. Lung -distant breath sounds, diffuse rales, rhonchi, no wheezes. Heart - S1, S2 heard, no murmurs, rubs, chronic pedal edema. Abdomen - Soft, non tender obese, bowel sounds good Neuro -  Alert, awake and oriented x 3, non focal exam. Skin - Warm and dry.  Condition at discharge: stable  The results of significant diagnostics from this hospitalization (including imaging, microbiology, ancillary and laboratory) are listed below for reference.   Imaging Studies: DG Chest Port 1 View Result Date: 09/26/2023 CLINICAL DATA:  [id number] CHF (congestive heart failure) (HCC) [id number] EXAM: PORTABLE CHEST 1 VIEW COMPARISON:  09/25/2023 FINDINGS: Right IJ central venous catheter is stable in positioning. Stable size of the cardiac silhouette. Mild vascular congestion. No focal airspace consolidation, pleural effusion, or pneumothorax. IMPRESSION: Mild pulmonary vascular congestion. Electronically Signed   By: Duanne Guess D.O.   On: 09/26/2023 13:50   DG Chest Port 1 View Result Date: 09/25/2023 CLINICAL DATA:  Hemoptysis. EXAM: PORTABLE CHEST 1 VIEW COMPARISON:  Radiograph 2 days ago 09/23/2023, most recent chest CT 09/20/2023 FINDINGS: Stable right-sided central line. The heart is normal in size. No focal airspace disease, pleural effusion, pulmonary edema or pneumothorax. IMPRESSION: No acute chest findings. Stable radiographic appearance of the chest. Electronically Signed   By: Narda Rutherford M.D.   On: 09/25/2023 16:47   DG Chest Port 1 View Result Date: 09/23/2023 CLINICAL DATA:  [id number] Hemoptysis [id number] EXAM: PORTABLE CHEST 1 VIEW COMPARISON:  CXR 09/19/23 FINDINGS: No pleural effusion. No pneumothorax. Cardiomegaly. No focal airspace opacity. There are prominent  bilateral interstitial opacities that could represent pulmonary venous congestion without overt pulmonary edema. No radiographically apparent displaced rib fractures. Visualized upper abdomen is unremarkable. IMPRESSION: Borderline cardiomegaly and likely pulmonary venous congestion. No evidence of overt pulmonary edema. Electronically Signed   By: Lorenza Cambridge M.D.   On: 09/23/2023 09:58   IR Fluoro Guide CV Line Right Result Date: 09/22/2023 CLINICAL DATA:  Heart failure and need for tunneled central venous catheter for continuous milrinone infusion therapy. EXAM: TUNNELED CENTRAL VENOUS CATHETER PLACEMENT WITH ULTRASOUND AND FLUOROSCOPIC GUIDANCE ANESTHESIA/SEDATION: None MEDICATIONS: None FLUOROSCOPY: 32 seconds.  35.5 mGy. PROCEDURE: The procedure, risks, benefits, and alternatives were explained to the patient. Questions regarding the procedure were encouraged and answered. The patient understands and consents to the procedure. A timeout was performed prior to initiating the procedure. The right neck and chest were prepped with chlorhexidine in a sterile fashion, and a sterile drape was applied covering the operative field. Maximum barrier sterile technique with sterile gowns and gloves were used for the procedure. Local anesthesia was provided with 1% lidocaine. Ultrasound was used to confirm patency of the right internal jugular vein. Under ultrasound image was saved and recorded. After creating a small venotomy incision, a 21 gauge needle was advanced into the right internal jugular vein under direct, real-time ultrasound guidance. Ultrasound image documentation was performed. After securing guidewire access, a 6 French peel-away sheath was placed. A wire was kinked to measure appropriate catheter length. A 6 [place], dual-lumen power line was chosen for placement. This was tunneled in a retrograde fashion from the chest wall to the venotomy incision. The catheter was cut to 25 cm based on guidewire  measurement. The catheter was then placed through the sheath and the sheath removed. Final catheter positioning was confirmed and documented with a fluoroscopic spot image. The catheter was aspirated, flushed with saline, and injected with appropriate volume heparin dwells. The venotomy incision was closed with subcuticular 4-0 Vicryl. Dermabond was applied to the incision. The catheter exit site was secured with 0-Prolene retention sutures. COMPLICATIONS: None.  No pneumothorax. FINDINGS: After catheter placement, the tip lies at the SVC/RA junction. The catheter aspirates  normally and is ready for immediate use. IMPRESSION: Placement of tunneled central venous catheter via the right internal jugular vein. The catheter tip lies at the SVC/RA junction. The catheter is ready for immediate use. Electronically Signed   By: Irish Lack M.D.   On: 09/22/2023 17:04   CARDIAC CATHETERIZATION Result Date: 09/21/2023 HEMODYNAMICS: RA:       16 mmHg (mean) RV:       103/9, 16 mmHg PA:       103/26 mmHg (55 mean) PCWP: 13 mmHg (mean)    Estimated Fick CO/CI   3.16L/min, 1.24L/min/m2 TPG  32  mmHg     PVR  10.41 Wood Units PAPi  4.8  IMPRESSION: Right heart catheterization for evaluation of pulmonary hypertension. Mildly elevated right sided filling pressures. 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 Severely reduced cardiac index by assumed Fick. RECOMMENDATIONS: Start milrinone and pulmonary vasodilators for decompensated PH. Start PA process for riociguat Plan for pulmonary angiogram  ECHOCARDIOGRAM COMPLETE Result Date: 09/21/2023    ECHOCARDIOGRAM REPORT   Patient Name:   Ryan [name] Date of Exam: 09/20/2023 Medical Rec #:  [id number]     Height:       67.0 in Accession #:    [id number]    Weight:       363.8 lb Date of Birth:  1970-11-18     BSA:          2.608 m Patient Age:    52 years      BP:           103/74 mmHg Patient Gender: M              HR:           76 bpm. Exam Location:  [facility] Procedure: 2D Echo, Cardiac Doppler and Color Doppler Indications:     Right Ventricular Failure  History:         Patient has prior history of Echocardiogram examinations, most                  recent 06/21/2023. CHF; Risk Factors:Hypertension and Diabetes.                  DVT. History of pulmonary embolus. Chronic kidney disease.  Sonographer:     Sedonia Small Rodgers-Jones RDCS Referring Phys:  [id number] Benay Spice [facility] Diagnosing Phys: Yvonne Kendall MD IMPRESSIONS  1. Left ventricular ejection fraction, by estimation, is 65 to 70%. The left ventricle has normal function. The left ventricle has no regional wall motion abnormalities. Left ventricular diastolic parameters are consistent with Grade I diastolic dysfunction (impaired relaxation). There is the interventricular septum is flattened in systole, consistent with right ventricular pressure overload.  2. Right ventricular systolic function is mildly reduced. The right ventricular size is moderately enlarged. Tricuspid regurgitation signal is inadequate for assessing PA pressure.  3. Right atrial size was mildly dilated.  4. The mitral valve is normal in structure. No evidence of mitral valve regurgitation.  5. The aortic valve has an indeterminant number of cusps. There is mild calcification of the aortic valve. There is mild thickening of the aortic valve. Aortic valve regurgitation is not visualized. Aortic valve sclerosis/calcification is present, without any evidence of aortic stenosis.  6. There is borderline dilatation of the ascending aorta, measuring 36 mm. FINDINGS  Left Ventricle: Left ventricular ejection fraction, by estimation, is 65 to 70%. The left ventricle has normal function. The  left ventricle has no regional wall motion abnormalities. The left ventricular internal cavity size was normal in size. There is  borderline left ventricular hypertrophy. The interventricular septum is flattened in systole,  consistent with right ventricular pressure overload. Left ventricular diastolic parameters are consistent with Grade I diastolic dysfunction (impaired relaxation). Right Ventricle: The right ventricular size is moderately enlarged. No increase in right ventricular wall thickness. Right ventricular systolic function is mildly reduced. Tricuspid regurgitation signal is inadequate for assessing PA pressure. Left Atrium: Left atrial size was normal in size. Right Atrium: Right atrial size was mildly dilated. Pericardium: There is no evidence of pericardial effusion. Mitral Valve: The mitral valve is normal in structure. No evidence of mitral valve regurgitation. Tricuspid Valve: The tricuspid valve is normal in structure. Tricuspid valve regurgitation is trivial. Aortic Valve: The aortic valve has an indeterminant number of cusps. There is mild calcification of the aortic valve. There is mild thickening of the aortic valve. There is mild aortic valve annular calcification. Aortic valve regurgitation is not visualized. Aortic valve sclerosis/calcification is present, without any evidence of aortic stenosis. Aortic valve mean gradient measures 6.0 mmHg. Aortic valve peak gradient measures 10.5 mmHg. Aortic valve area, by VTI measures 3.53 cm. Pulmonic Valve: The pulmonic valve was normal in structure. Pulmonic valve regurgitation is not visualized. No evidence of pulmonic stenosis. Aorta: The aortic root is normal in size and structure. There is borderline dilatation of the ascending aorta, measuring 36 mm. Pulmonary Artery: The pulmonary artery is of normal size. IAS/Shunts: The interatrial septum was not well visualized.  LEFT VENTRICLE PLAX 2D LVIDd:         3.90 cm   Diastology LVIDs:         2.70 cm   LV e' medial:    6.09 cm/s LV PW:         1.10 cm   LV E/e' medial:  10.8 LV IVS:        1.10 cm   LV e' lateral:   13.55 cm/s LVOT diam:     2.20 cm   LV E/e' lateral: 4.9 LV SV:         88 LV SV Index:   34 LVOT  Area:     3.80 cm  RIGHT VENTRICLE RV Basal diam:  6.10 cm RV S prime:     14.00 cm/s TAPSE (M-mode): 1.9 cm LEFT ATRIUM             Index        RIGHT ATRIUM           Index LA diam:        3.90 cm 1.50 cm/m   RA Area:     24.00 cm LA Vol (A2C):   74.7 ml 28.65 ml/m  RA Volume:   89.60 ml  34.36 ml/m LA Vol (A4C):   51.5 ml 19.75 ml/m LA Biplane Vol: 64.0 ml 24.54 ml/m  AORTIC VALVE AV Area (Vmax):    3.10 cm AV Area (Vmean):   2.80 cm AV Area (VTI):     3.53 cm AV Vmax:           162.02 cm/s AV Vmean:          116.683 cm/s AV VTI:            0.251 m AV Peak Grad:      10.5 mmHg AV Mean Grad:      6.0 mmHg LVOT Vmax:  132.00 cm/s LVOT Vmean:        85.833 cm/s LVOT VTI:          0.233 m LVOT/AV VTI ratio: 0.93  AORTA Ao Root diam: 3.80 cm Ao Asc diam:  3.60 cm MITRAL VALVE MV Area (PHT): 2.81 cm    SHUNTS MV Decel Time: 270 msec    Systemic VTI:  0.23 m MV E velocity: 66.05 cm/s  Systemic Diam: 2.20 cm MV A velocity: 92.25 cm/s MV E/A ratio:  0.72 Cristal Deer End MD Electronically signed by Yvonne Kendall MD Signature Date/Time: 09/21/2023/7:31:44 AM    Final    [place] EKG SITE RITE Result Date: 09/20/2023 If Site Rite image not attached, placement could not be confirmed due to current cardiac rhythm.  [place] EKG SITE RITE Result Date: 09/20/2023 If [facility] image not attached, placement could not be confirmed due to current cardiac rhythm.  CT Angio Chest PE W/Cm &/Or Wo Cm Result Date: 09/20/2023 CLINICAL DATA:  53 year old male with shortness of breath, inspiratory sharp pain. History of acute and chronic pulmonary embolus. EXAM: CT ANGIOGRAPHY CHEST WITH CONTRAST TECHNIQUE: Multidetector CT imaging of the chest was performed using the standard protocol during bolus administration of intravenous contrast. Multiplanar CT image reconstructions and MIPs were obtained to evaluate the vascular anatomy. RADIATION DOSE REDUCTION: This exam was performed according to the departmental  dose-optimization program which includes automated exposure control, adjustment of the mA and/or kV according to patient size and/or use of iterative reconstruction technique. CONTRAST:  OMNIPAQUE IOHEXOL 350 MG/ML SOLN COMPARISON:  CTA chest 08/18/2023 and earlier. FINDINGS: Cardiovascular: Good contrast bolus timing in the pulmonary arterial tree. Bulky, unresolved chronic PE in the right main pulmonary artery redemonstrated on series 6, image 108. This appears unchanged from last month and is decreased compared to 12/13/2022. No saddle embolus. Little residual left main pulmonary artery clot. Comparatively small volume chronic PE in the left lower lobe has not significantly changed from the CTA last month or last April. Occasional small volume chronic PE also in the left upper lobe appears stable. Similar intermittent small volume chronic PE in the right lower lobe appears stable from last month, and seems mildly improved from last April. Chronic attenuation of right upper lobe pulmonary arteries is stable. No convincing acute pulmonary embolus. Stable cardiac size, borderline with no pericardial effusion. Contrast reflux from the right heart into the IVC and hepatic veins appears unchanged from last month. Little contrast in the aorta today. Mild Calcified aortic atherosclerosis. Prominent anterior chest wall and internal mammary venous collaterals are chronic and stable. Mediastinum/Nodes: Stable.  No mediastinal mass or lymphadenopathy. Lungs/Pleura: Slightly improved lung volumes from last month. Major airways remain patent. Asymmetric chronic left lung pulmonary artery enlargement and tortuosity is noted. No pleural effusion or consolidation. Ventilation not significantly changed from last April. Upper Abdomen: The chronic chest wall venous collaterals continue along the abdominal wall, stable. IVC filter remains in place but patency is not evaluated with the contrast timing on this exam. Visible  liver, gallbladder, spleen, pancreas, adrenal glands, and kidneys appear stable with only very early contrast opacification due to timing. Negative visible bowel. No free air or free fluid in the visible abdomen. Musculoskeletal: Some flowing endplate osteophytes in the thoracic spine again noted. Stable visualized osseous structures. No acute or suspicious osseous abnormality. Review of the MIP images confirms the above findings. IMPRESSION: 1. Stable Chronic PE, most pronounced in the right main pulmonary artery, and with chronically attenuated right upper  lobe arterial branches. 2. 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 Stable ventilation with no acute pulmonary opacity or effusion. 3. Stable heart size with no pericardial effusion. Chronic venous collaterals in the chest and upper abdomen associated with abnormal distal IVC demonstrated on CT Abdomen and Pelvis in 2023. 4. No new abnormality identified on CTA Chest. Electronically Signed   By: Odessa Fleming M.D.   On: 09/20/2023 04:45   DG Chest 2 View Result Date: 09/19/2023 CLINICAL DATA:  Chest pain and shortness of breath. EXAM: CHEST - 2 VIEW COMPARISON:  CTA chest 08/18/2023.  PA Lat chest 08/18/2023. FINDINGS: The cardiac size is normal. There are mildly prominent central pulmonary arteries. No vascular congestion is seen. The mediastinum is normally outlined. The lungs are clear. There is bridging enthesopathy of the lower thoracic spine. IMPRESSION: No evidence of acute chest disease. Mildly prominent central pulmonary arteries. Stable chest. Electronically Signed   By: Almira Bar M.D.   On: 09/19/2023 22:31    Microbiology: Results for orders placed or performed during the hospital encounter of 09/20/23  MRSA Next Gen by PCR, Nasal     Status: None   Collection Time: 09/20/23  6:00 PM   Specimen: Nasal Mucosa; Nasal Swab  Result Value Ref Range Status   MRSA by PCR Next Gen NOT DETECTED  NOT DETECTED Final    Comment: (NOTE) The GeneXpert MRSA Assay (FDA approved for NASAL specimens only), is one component of a comprehensive MRSA colonization surveillance program. It is not intended to diagnose MRSA infection nor to guide or monitor treatment for MRSA infections. Test performance is not FDA approved in patients less than 15 years old. Performed at [facility], [address] [place], [place] [address]     Labs: CBC: Recent Labs  Lab 09/24/23 0505 09/25/23 0428 09/26/23 0510 09/29/23 0350  WBC 5.3 4.8 7.9 8.7  HGB 14.5 14.6 14.8 14.3  HCT 44.0 44.5 45.4 42.7  MCV 92.6 93.3 93.4 91.6  PLT 219 226 265 287   Basic Metabolic Panel: Recent Labs  Lab 09/24/23 0505 09/25/23 0428 09/26/23 0510 09/27/23 0444 09/28/23 0422 09/29/23 0350 09/30/23 0416  NA 132* 133* 131* 131* 132* 132* 134*  K 4.0 3.9 4.6 4.7 5.1 4.9 4.8  CL 96* 97* 96* 97* 97* 100 101  CO2 23 24 23 24 24 24 25   GLUCOSE 118* 126* 154* 152* 230* 207* 170*  BUN 26* 34* 45* 52* 51* 45* 41*  CREATININE 1.61* 1.76* 1.98* 1.73* 1.54* 1.37* 1.32*  CALCIUM 9.1 8.9 9.4 9.2 9.0 8.9 8.8*  MG 2.3 2.3  --   --   --   --   --    Liver Function Tests: No results for input(s): "AST", "ALT", "ALKPHOS", "BILITOT", "PROT", "ALBUMIN" in the last 168 hours. CBG: Recent Labs  Lab 09/29/23 1138 09/29/23 1801 09/29/23 2327 09/30/23 0537 09/30/23 1138  GLUCAP 151* 165* 175* 121* 167*    Discharge time spent: 39 minutes.  Signed: Marcelino Duster, MD [facility] 09/30/2023

## 2023-09-30 NOTE — TOC Transition Note (Signed)
Transition of Care [facility]) - Discharge Note   Patient Details  Name: Ryan [name] MRN: [id number] Date of Birth: [birth year]  Transition [facility]) CM/SW Contact:  Margarito Liner, LCSW Phone Number: 09/30/2023, 12:00 PM   Clinical Narrative:   Patient has orders to discharge home today. [employer] liaison is aware. She will be here around 4:00 today for home Milrinone setup. Patient does not qualify for continuous oxygen. CSW tried calling [employer] to notify but no answer. Will try again later. No further concerns. CSW signing off.   Final next level of care: Home w Home Health Services Barriers to Discharge: Barriers Resolved   Patient Goals and CMS Choice            Discharge Placement                Patient to be transferred to facility by: Family   Patient and family notified of of transfer: 09/30/23  Discharge Plan and Services Additional resources added to the After Visit Summary for       Post Acute Care Choice: Home Health                    HH Arranged: RN [facility] Agency: [employer] Date HH Agency Contacted: 09/30/23   Representative spoke with at [facility] Agency: Pam  Social Drivers of Health (SDOH) Interventions SDOH Screenings   Food Insecurity: Patient Declined (09/20/2023)  Housing: Patient Declined (09/20/2023)  Transportation Needs: Patient Declined (09/20/2023)  Utilities: Patient Declined (09/20/2023)  Depression (PHQ2-9): Low Risk  (08/18/2023)  Financial Resource Strain: Low Risk  (08/18/2023)  Physical Activity: Insufficiently Active (08/18/2023)  Social Connections: Moderately Integrated (08/18/2023)  Stress: No Stress Concern Present (08/18/2023)  Tobacco Use: Low Risk  (09/20/2023)     Readmission Risk Interventions    09/23/2023    4:32 PM  Readmission Risk Prevention Plan  Transportation Screening Complete  PCP or Specialist Appt within 3-5 Days Complete  HRI or Home Care Consult Complete  Social Work Consult for Recovery Care  Planning/Counseling Complete  Palliative Care Screening Not Applicable  Medication Review [employer]) Complete

## 2023-09-30 NOTE — Progress Notes (Signed)
ANTICOAGULATION CONSULT NOTE  Pharmacy Consult for warfarin  Indication: CTEPH / recurrent DVT / PE  Patient Measurements: Height: 5\' 7"  (170.2 cm) Weight: (!) 151.2 kg (333 lb 5.4 oz) IBW/kg (Calculated) : 66.1 Heparin Dosing Weight: 104.8 kg  Labs: Recent Labs    09/28/23 0422 09/29/23 0350 09/30/23 0416  HGB  --  14.3  --   HCT  --  42.7  --   PLT  --  287  --   LABPROT 28.9* 26.2* 24.3*  INR 2.7* 2.4* 2.2*  CREATININE 1.54* 1.37* 1.32*    Estimated Creatinine Clearance: 91.6 mL/min (A) (by C-G formula based on SCr of 1.32 mg/dL (H)).   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  Regular the workup has not been forthcoming) previously followed by Dr. Isaiah Serge at [facility]   Hepatic steatosis    Hypertension    Morbid obesity with BMI of 50.0-59.9, adult (HCC)    Oxygen deficiency    Peripheral vascular disease (HCC)    Presence of IVC filter    Has 2 IVC filters with significant thrombus burden superior to the filter.   Pulmonary embolus (HCC) 12/2016   Initially just treated with anticoagulation, but in February 2023 in April 2024 treated with EKOS thrombectomy for submassive PE.   Ventricular trigeminy    Weakness of both lower limbs     Assessment: Pt is a 53 yo male w/ hx of DVT/PE on warfarin (7.5mg  dose PTA, last dose 1/19 @ 2345) presenting to ED c/o SOB x 1.5 days found with elevated troponin I level trending up.  Pt has/had cardiac cath procedure scheduled for today at 0730. 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 Per chart review, has MTHFR gene mutation and was  previously followed by Hematology (Dr. Isaiah Serge)   Last note from anticoagulation clinic on 09/15/23 noted INR goal 2.5-3 given recurrent thromboembolic events with INR < 2. Patient with supratherapeutic INRs on 1/16 and 1/17 so dose was decreased from 7.5 mg daily to 5 mg daily and patient was instructed to hold warfarin until 1/19.  Goal of Therapy:  INR 2.5 - 3 Monitor platelets by anticoagulation protocol: Yes  Warfarin Plan: --INR trending slightly subtherapeutic, trend is down slightly --will order warfarin 7.5 mg po x 1 today  --Will assess daily --INR daily and CBC per protocol  Lowella Bandy 09/30/2023 7:15 AM

## 2023-10-03 ENCOUNTER — Telehealth: Payer: Self-pay

## 2023-10-03 ENCOUNTER — Telehealth: Payer: Self-pay | Admitting: Cardiology

## 2023-10-03 NOTE — Telephone Encounter (Signed)
Lvm for appt on 10/04/23

## 2023-10-03 NOTE — Patient Outreach (Signed)
Care Management  Transitions of Care Program Transitions of Care Post-discharge Initial   10/03/2023 Name: Ryan [name] MRN: [id number] DOB: Jan 12, 1971  Subjective: Ryan [name] is a 53 y.o. year old male who is a primary care patient of Sherlene Shams, MD. The Care Management team Engaged with patient Engaged with patient by telephone to assess and address transitions of care needs.   Consent to Services:  Patient was given information about care management services, agreed to services, and gave verbal consent to participate.   Assessment:  Date of Discharge: 09/30/23 Discharge Facility: [facility]) Type of Discharge: Inpatient Admission Primary Inpatient Discharge Diagnosis:: CHF  SDOH Interventions    Flowsheet Row Telephone from 10/03/2023 in [place] POPULATION HEALTH DEPARTMENT Telephone from 06/27/2023 in [place] POPULATION HEALTH DEPARTMENT  SDOH Interventions    Food Insecurity Interventions Intervention Not Indicated Intervention Not Indicated  Housing Interventions Intervention Not Indicated --  Transportation Interventions Intervention Not Indicated Intervention Not Indicated  Utilities Interventions Intervention Not Indicated --  Social Connections Interventions Intervention Not Indicated --        Goals Addressed             This Visit's Progress    Patient Stated       Current Barriers:  Chronic Disease Management support and education needs related to CHF   RNCM Clinical Goal(s):  Patient will work with the Care Management team over the next 30 days to address Transition of Care Barriers: Medication access Medication Management Diet/Nutrition/Food Resources Support at home Provider appointments Home Health services Equipment/DME verbalize basic understanding of  CHF disease process and self health management plan as evidenced by verbal teach back and no hospital readmissions  through collaboration with RN Care  manager, provider, and care team.   Interventions: Evaluation of current treatment plan related to  self management and patient's adherence to plan as established by provider   Heart Failure Interventions:  (Status:  New goal.) Short Term Goal Provided education on low sodium diet Assessed need for readable accurate scales in home Provided education about placing scale on hard, flat surface Advised patient to weigh each morning after emptying bladder Discussed importance of daily weight and advised patient to weigh and record daily Reviewed role of diuretics in prevention of fluid overload and management of heart failure; Discussed the importance of keeping all appointments with provider Screening for signs and symptoms of depression related to chronic disease state  Assessed social determinant of health barriers   Patient Goals/Self-Care Activities: Participate in Transition of Care Program/Attend [facility] scheduled calls Notify RN Care Manager of [facility] call rescheduling needs Take all medications as prescribed Attend all scheduled provider appointments Call pharmacy for medication refills 3-7 days in advance of running out of medications Call provider office for new concerns or questions   Follow Up Plan:  Telephone follow up appointment with care management team member scheduled for:  Monday February 10th at 10:00am       [facility] outreach completed to the patient today. The Milrinone is new for him and he has to wear it due to multiple blood clots in his lungs. He also has a INR machine at home that he is waiting on since he is on Coumadin. He states a nurse is coming out today to help with teaching on the Milrione and INR machine. The patient currently works for [employer] and would like to get back to work. He feels good about his currently plan.  He lives at home with his 57 year old daughter.  Routine follow-up and on-going assessment evaluation and education of disease processes,  and recommended interventions for both chronic and acute medical conditions, will occur during each weekly visit during [facility] Outreach calls along with ongoing review of symptoms, medication reviews and reconciliation. Any updates, inconsistencies, discrepancies or acute care concerns will be addressed on the Care Plan and routed to the correct Practitioner if indicated.    The patient has been provided with contact information for the care management team and has been advised to call with any health-related questions or concerns. The patient verbalized understanding with current POC. The patient is directed to their insurance card regarding availability of benefits coverage.   Deidre Ala, BSN, RN [place]  VBCI - [employer] [facility] RN Care Manager [phone]

## 2023-10-03 NOTE — Telephone Encounter (Signed)
Adempas approved. Titration every 2 weeks per [facility] specialty RN to goal dose of 2.5 mg three times daily.

## 2023-10-03 NOTE — Telephone Encounter (Signed)
Pt cb to confirm appt for 10/04/23

## 2023-10-04 ENCOUNTER — Encounter: Payer: No Typology Code available for payment source | Admitting: Cardiology

## 2023-10-05 NOTE — Telephone Encounter (Signed)
 Called Ryan [name] to schedule him a visit with Dr. Maribeth. Ryan [name] states this message is a FYI to Dr. Marylynn and he is in bed riding it out. Ryan [name] broke the fever and states fever is gone and I have everything needed to take care of myself and plenty of fluids. I let Ryan [name] know if he changes his mind or anything changes to call and we can get him in to be evaluated . Ryan [name] agreed that if he needs us  he will call and schedule.

## 2023-10-10 ENCOUNTER — Other Ambulatory Visit: Payer: Self-pay

## 2023-10-10 LAB — PROTIME-INR: INR: 2.8 — AB (ref 0.80–1.20)

## 2023-10-10 NOTE — Telephone Encounter (Signed)
 Pt was offered an appt and refused.

## 2023-10-10 NOTE — Patient Instructions (Signed)
 Visit Information  Thank you for taking time to visit with me today. Please don't hesitate to contact me if I can be of assistance to you before our next scheduled telephone appointment.  Our next appointment is by telephone on Monday February 17th at 10:00am  Following is a copy of your care plan:   Goals Addressed             This Visit's Progress    TOC Care Plan       Current Barriers: (Reviewed 10/10/23) Chronic Disease Management support and education needs related to CHF   RNCM Clinical Goal(s):  (Reviewed 10/10/23) Patient will work with the Care Management team over the next 30 days to address Transition of Care Barriers: Medication access Medication Management Diet/Nutrition/Food Resources Support at home Provider appointments Home Health services Equipment/DME verbalize basic understanding of  CHF disease process and self health management plan as evidenced by verbal teach back and no hospital readmissions  through collaboration with RN Care manager, provider, and care team.   Interventions:  (Reviewed 10/10/23) Evaluation of current treatment plan related to  self management and patient's adherence to plan as established by provider   Heart Failure Interventions:  (Status:  New goal.) Short Term Goal   (Reviewed 10/10/23) Provided education on low sodium diet Assessed need for readable accurate scales in home Provided education about placing scale on hard, flat surface Advised patient to weigh each morning after emptying bladder Discussed importance of daily weight and advised patient to weigh and record daily Reviewed role of diuretics in prevention of fluid overload and management of heart failure; Discussed the importance of keeping all appointments with provider Screening for signs and symptoms of depression related to chronic disease state  Assessed social determinant of health barriers   Patient Goals/Self-Care Activities:  (Reviewed 10/10/23) Participate in  Transition of Care Program/Attend [facility] scheduled calls Notify RN Care Manager of [facility] call rescheduling needs Take all medications as prescribed Attend all scheduled provider appointments Call pharmacy for medication refills 3-7 days in advance of running out of medications Call provider office for new concerns or questions   Follow Up Plan:  Telephone follow up appointment with care management team member scheduled for:  Monday February 17th at 10:00am        Patient verbalizes understanding of instructions and care plan provided today and agrees to view in MyChart. Active MyChart status and patient understanding of how to access instructions and care plan via MyChart confirmed with patient.     The patient has been provided with contact information for the care management team and has been advised to call with any health related questions or concerns.   Please call the care guide team at [phone] if you need to cancel or reschedule your appointment.   Please call the Suicide and Crisis Lifeline: 988 call the [place]  National Suicide Prevention Lifeline: 4342633154 or TTY: 508 523 7300 TTY (225)779-4740) to talk to a trained counselor if you are experiencing a Mental Health or Behavioral Health Crisis or need someone to talk to.  Gareld June, BSN, RN [place]  VBCI - [employer] [facility] RN Care Manager [phone]

## 2023-10-10 NOTE — Patient Outreach (Signed)
 Care Management  Transitions of Care Program Transitions of Care Post-discharge week 2   10/10/2023 Name: Ryan [name] MRN: [id number] DOB: [birth year]  Subjective: Ryan [name] is a 54 y.o. year old male who is a primary care patient of Thersia Flax, MD. The Care Management team Engaged with patient Engaged with patient by telephone to assess and address transitions of care needs.   Consent to Services:  Patient was given information about care management services, agreed to services, and gave verbal consent to participate.   Assessment: TOC Outreach to the patient. He continues with PICC line and IV Milrinone . The nurse comes out once a week to change bab and dressing. The patient has chronic PE and now has an INR machine at home because he is on Coumadin . He states he feels good and his goal is to go back to work. RNCM to check in after his 10/14/23 provider appointment.   SDOH Interventions    Flowsheet Row Patient Outreach from 10/10/2023 in [place] POPULATION HEALTH DEPARTMENT Telephone from 10/03/2023 in [place] POPULATION HEALTH DEPARTMENT Telephone from 06/27/2023 in [place] POPULATION HEALTH DEPARTMENT  SDOH Interventions     Food Insecurity Interventions Intervention Not Indicated Intervention Not Indicated Intervention Not Indicated  Housing Interventions Intervention Not Indicated Intervention Not Indicated --  Transportation Interventions Intervention Not Indicated Intervention Not Indicated Intervention Not Indicated  Utilities Interventions Intervention Not Indicated Intervention Not Indicated --  Social Connections Interventions Intervention Not Indicated Intervention Not Indicated --        Goals Addressed             This Visit's Progress    TOC Care Plan       Current Barriers: (Reviewed 10/10/23) Chronic Disease Management support and education needs related to CHF   RNCM Clinical Goal(s):  (Reviewed 10/10/23) Patient will work with the Care  Management team over the next 30 days to address Transition of Care Barriers: Medication access Medication Management Diet/Nutrition/Food Resources Support at home Provider appointments Home Health services Equipment/DME verbalize basic understanding of  CHF disease process and self health management plan as evidenced by verbal teach back and no hospital readmissions  through collaboration with RN Care manager, provider, and care team.   Interventions:  (Reviewed 10/10/23) Evaluation of current treatment plan related to  self management and patient's adherence to plan as established by provider   Heart Failure Interventions:  (Status:  New goal.) Short Term Goal   (Reviewed 10/10/23) Provided education on low sodium diet Assessed need for readable accurate scales in home Provided education about placing scale on hard, flat surface Advised patient to weigh each morning after emptying bladder Discussed importance of daily weight and advised patient to weigh and record daily Reviewed role of diuretics in prevention of fluid overload and management of heart failure; Discussed the importance of keeping all appointments with provider Screening for signs and symptoms of depression related to chronic disease state  Assessed social determinant of health barriers   Patient Goals/Self-Care Activities:  (Reviewed 10/10/23) Participate in Transition of Care Program/Attend [facility] scheduled calls Notify RN Care Manager of [facility] call rescheduling needs Take all medications as prescribed Attend all scheduled provider appointments Call pharmacy for medication refills 3-7 days in advance of running out of medications Call provider office for new concerns or questions   Follow Up Plan:  Telephone follow up appointment with care management team member scheduled for:  Monday February 17th at 10:00am  Routine follow-up and on-going assessment evaluation and education of disease processes, and  recommended interventions for both chronic and acute medical conditions, will occur during each weekly visit during [facility] 30-day Program Outreach calls along with ongoing review of symptoms, medication reviews and reconciliation. Any updates, inconsistencies, discrepancies or acute care concerns will be addressed on the Care Plan and routed to the correct Practitioner if indicated.    The patient has been provided with contact information for the care management team and has been advised to call with any health-related questions or concerns. The patient verbalized understanding with current POC. The patient is directed to their insurance card regarding availability of benefits coverage.  Gareld June, BSN, RN [place]  VBCI - [employer] [facility] RN Care Manager [phone]

## 2023-10-11 ENCOUNTER — Encounter: Payer: Self-pay | Admitting: Internal Medicine

## 2023-10-12 ENCOUNTER — Other Ambulatory Visit: Payer: Self-pay | Admitting: Nephrology

## 2023-10-12 DIAGNOSIS — N1831 Chronic kidney disease, stage 3a: Secondary | ICD-10-CM

## 2023-10-12 DIAGNOSIS — E1122 Type 2 diabetes mellitus with diabetic chronic kidney disease: Secondary | ICD-10-CM

## 2023-10-14 ENCOUNTER — Telehealth: Payer: No Typology Code available for payment source | Admitting: Internal Medicine

## 2023-10-14 ENCOUNTER — Encounter: Payer: Self-pay | Admitting: Internal Medicine

## 2023-10-14 ENCOUNTER — Telehealth: Payer: Self-pay | Admitting: Cardiology

## 2023-10-14 VITALS — BP 117/80 | Ht 67.0 in | Wt 340.0 lb

## 2023-10-14 DIAGNOSIS — I2724 Chronic thromboembolic pulmonary hypertension: Secondary | ICD-10-CM

## 2023-10-14 DIAGNOSIS — I272 Pulmonary hypertension, unspecified: Secondary | ICD-10-CM

## 2023-10-14 DIAGNOSIS — E1122 Type 2 diabetes mellitus with diabetic chronic kidney disease: Secondary | ICD-10-CM

## 2023-10-14 DIAGNOSIS — Z09 Encounter for follow-up examination after completed treatment for conditions other than malignant neoplasm: Secondary | ICD-10-CM | POA: Insufficient documentation

## 2023-10-14 MED ORDER — TIRZEPATIDE 7.5 MG/0.5ML ~~LOC~~ SOAJ: 7.5000 mg | SUBCUTANEOUS | 0 refills | Status: DC

## 2023-10-14 MED ORDER — WARFARIN SODIUM 5 MG PO TABS: 5.0000 mg | ORAL_TABLET | Freq: Every day | ORAL | 1 refills | Status: AC

## 2023-10-14 MED ORDER — PANTOPRAZOLE SODIUM 40 MG PO TBEC: 40.0000 mg | DELAYED_RELEASE_TABLET | Freq: Every day | ORAL | 3 refills | Status: DC

## 2023-10-14 NOTE — Telephone Encounter (Signed)
Pt confirmed appt for 10/17/23

## 2023-10-14 NOTE — Assessment & Plan Note (Signed)
Patient is stable post discharge and has no new issues or questions about her discharge plans,   he will need his short term disability extended as he is currently receiivng IV milrinone infusions via PICC line.

## 2023-10-14 NOTE — Assessment & Plan Note (Signed)
Severe, with reduced cardiac index by Fick.  EF 65 to 70%.  Continue milrinone infusion and adaempas tid . Follow up with cardiology next week

## 2023-10-14 NOTE — Assessment & Plan Note (Signed)
Managed with Mounjaro.  He did not tolerate Jardiance,  and Marcelline Deist is no covered

## 2023-10-14 NOTE — Progress Notes (Signed)
Virtual Visit via Caregility   Note   This format is felt to be most appropriate for this patient at this time.  All issues noted in this document were discussed and addressed.  No physical exam was performed (except for noted visual exam findings with Video Visits).   I connected with Ryan [name]  on 10/14/23 at 11:30 AM EST by a video enabled telemedicine application and verified that I am speaking with the correct person using two identifiers. Location patient: home Location provider: work or home office Persons participating in the virtual visit: patient, provider  I discussed the limitations, risks, security and privacy concerns of performing an evaluation and management service by telephone and the availability of in person appointments. I also discussed with the patient that there may be a patient responsible charge related to this service. The patient expressed understanding and agreed to proceed.   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  Both noted LVEF 65 to 70%  severe pulm htn and Severely reduced cardiac index (Fick) . He was discharged home on a milrinone drip via PICC line, and oral Adempas, 1 mg tid,  along with  spirolactone, digoxin and torsemide at reduced dose of  20 mg daily .  His weight at discharge was 333 lbs, but he states that his first home weight was 343,  lbs .  His weight today by home scales is 340 lbs.   S He has lost 3 lbs by daily measurements,  having ome reflux   He feels generally better .  He is not short of breath at rest.  Having some reflux from the adempas.  Room air sats have been  94% on room air  and improved with  ambulation even though he becomes  dyspneic with walking to the kitchen .   Appetite is normal.  Does not cook for self.  Having a hard time limiting the salt . Not getting fast food,  but eating a lot of diner  meals which are salt laden   Sees cardiology next thrusday,  and has a renal ultrasound scheduled for Wednesday    Anticoag:  taking 5 mg coumadin daily , last INR was therapeutic (goal INR 2.5 to 3.5) . His INR is now managed by me with MD INR  Lab Results  Component Value Date   INR 2.80 (A) 10/10/2023   INR 2.2 (H) 09/30/2023   INR 2.4 (H) 09/29/2023   Type 2 DM with morbid obesity: he is taking Mounjaro    ROS: See pertinent positives and negatives per HPI.  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  Regular the workup has not been forthcoming) previously followed by Dr. Isaiah Serge at [facility]  Hepatic steatosis    Hypertension    Morbid obesity with BMI of 50.0-59.9, adult (HCC)    Oxygen deficiency    Peripheral vascular disease (HCC)    Presence of IVC filter    Has 2 IVC filters with significant thrombus burden superior to the filter.   Pulmonary embolus (HCC) 12/2016   Initially just treated with anticoagulation, but in February 2023 in April 2024 treated with EKOS thrombectomy for submassive PE.   Ventricular trigeminy    Weakness of both lower limbs     Past Surgical History:  Procedure Laterality Date   CARDIAC ELECTROPHYSIOLOGY STUDY AND ABLATION  10/2020   CARDIOVERSION N/A 06/21/2023   Procedure: CARDIOVERSION;  Surgeon: Dorthula Nettles, DO;  Location: [facility] ORS;  Service:  Cardiovascular;  Laterality: N/A;   IR FLUORO GUIDE CV LINE RIGHT  09/22/2023   PERIPHERAL VASCULAR CATHETERIZATION N/A 01/10/2015   Procedure: Dialysis/Perma Catheter Insertion;  Surgeon: Renford Dills, MD;  Location: [facility] INVASIVE CV LAB;  Service: Cardiovascular;  Laterality: N/A;   PERIPHERAL VASCULAR CATHETERIZATION N/A 02/24/2015   Procedure: Dialysis/Perma Catheter Removal;  Surgeon: Annice Needy, MD;  Location: [facility] INVASIVE CV LAB;  Service: Cardiovascular;  Laterality: N/A;   PULMONARY THROMBECTOMY N/A 12/13/2022   Procedure: PULMONARY THROMBECTOMY;  Surgeon: Annice Needy, MD;  Location: [facility] INVASIVE CV LAB;  Service: Cardiovascular;  Laterality: N/A;   PULMONARY THROMBECTOMY N/A 05/17/2023   Procedure: PULMONARY THROMBECTOMY;  Surgeon: Renford Dills, MD;  Location: [facility] INVASIVE CV LAB;  Service: Cardiovascular;  Laterality: N/A;   RIGHT HEART CATH Right 09/20/2023   Procedure: RIGHT HEART CATH;  Surgeon: [name], MD;  [facility] INVASIVE CV LAB;  Service: Cardiovascular;  Laterality: Right;   TEE WITHOUT CARDIOVERSION N/A 06/21/2023   Procedure: TRANSESOPHAGEAL ECHOCARDIOGRAM (TEE);  Surgeon: Dorthula Nettles, DO;  Location: [facility] ORS;  Service: Cardiovascular;  Laterality: N/A;    Family History  Problem Relation Age of Onset   Hypertension Mother    Cancer Mother    Breast cancer Mother    Prostate cancer Father    Hypertension Father    Diabetes Father    Heart disease Maternal Grandmother    Pulmonary embolism Paternal Grandfather    Pulmonary embolism Paternal Uncle    Pulmonary embolism Cousin    Deep vein thrombosis Cousin    Pulmonary embolism Paternal Aunt     SOCIAL HX:  reports that he has never smoked. He has never used smokeless tobacco. He reports that he does not drink alcohol and does not use drugs.    Current Outpatient Medications:    albuterol (VENTOLIN HFA) 108 (90 Base) MCG/ACT inhaler, Inhale 2 puffs into the lungs every 6  (six) hours as needed for wheezing or shortness of breath., Disp: 8 g, Rfl: 2   Digoxin 62.5 MCG TABS, Take 0.0625 mg by mouth daily., Disp: 90 tablet, Rfl: 1   milrinone (PRIMACOR) 20 MG/100 ML SOLN infusion, Inject 0.0413 mg/min into the vein continuous., Disp: , Rfl:    Multiple Vitamin (MULTIVITAMIN WITH MINERALS) TABS tablet, Take 1 tablet by mouth daily., Disp: 30 tablet, Rfl: 0   pantoprazole (PROTONIX) 40 MG tablet, Take 1 tablet (40 mg total) by mouth daily. On an empty stomach 30 minutes prior to eating, Disp: 30 tablet, Rfl: 3   Riociguat (ADEMPAS) 1 MG TABS, Take 1 tablet by mouth 3 (three) times daily., Disp: 90 tablet, Rfl: 1   torsemide (DEMADEX) 20 MG tablet, Take 1 tablet (20 mg  total) by mouth daily., Disp: 30 tablet, Rfl: 3   guaiFENesin-dextromethorphan (ROBITUSSIN DM) 100-10 MG/5ML syrup, Take 5 mLs by mouth every 4 (four) hours as needed for cough. (Patient not taking: Reported on 10/14/2023), Disp: 118 mL, Rfl: 0   ondansetron (ZOFRAN) 4 MG tablet, Take 1 tablet (4 mg total) by mouth every 8 (eight) hours as needed for nausea or vomiting. (Patient not taking: Reported on 09/14/2023), Disp: 20 tablet, Rfl: 0   tirzepatide (MOUNJARO) 7.5 MG/0.5ML Pen, Inject 7.5 mg into the skin once a week., Disp: 2 mL, Rfl: 0   warfarin (COUMADIN) 5 MG tablet, Take 1 tablet (5 mg total) by mouth daily., Disp: 90 tablet, Rfl: 1  EXAM:  VITALS per patient if applicable:  GENERAL: alert, oriented, appears well and in no acute distress  HEENT: atraumatic, conjunttiva clear, no obvious abnormalities on inspection of external nose and ears  NECK: normal movements of the head and neck  LUNGS: on inspection no signs of respiratory distress, breathing rate appears normal, no obvious gross SOB, gasping or wheezing  CV: no obvious cyanosis  MS: moves all visible extremities without noticeable abnormality  PSYCH/NEURO: pleasant and cooperative, no obvious depression or anxiety, speech and thought  processing grossly intact  ASSESSMENT AND PLAN: Pulmonary hypertension, unspecified [facility])  Hospital discharge follow-up Assessment & Plan: Patient is stable post discharge and has no new issues or questions about her discharge plans,   he will need his short term disability extended as he is currently receiivng IV milrinone infusions via PICC line.     Type 2 diabetes mellitus with chronic kidney disease, without long-term current use of insulin, unspecified CKD stage [facility]) Assessment & Plan: Managed with Mounjaro.  He did not tolerate Jardiance,  and Marcelline Deist is no covered    CTEPH (chronic thromboembolic pulmonary hypertension) (HCC) Assessment & Plan: Severe, with reduced cardiac index by Fick.  EF 65 to 70%.  Continue milrinone infusion and adaempas tid . Follow up with cardiology next week    Other orders -     Tirzepatide; Inject 7.5 mg into the skin once a week.  Dispense: 2 mL; Refill: 0 -     Warfarin Sodium; Take 1 tablet (5 mg total) by mouth daily.  Dispense: 90 tablet; Refill: 1 -     Pantoprazole Sodium; Take 1 tablet (40 mg total) by mouth daily. On an empty stomach 30 minutes prior to eating  Dispense: 30 tablet; Refill: 3      I discussed the assessment and treatment plan with the patient. The patient was provided an opportunity to ask questions and all were answered. The patient agreed with the plan and demonstrated an understanding of the instructions.   The patient was advised to call back or seek an in-person evaluation if the symptoms worsen or if the condition fails to improve as anticipated.   I spent 30 minutes dedicated to the care of this patient on the date of this encounter to include pre-visit review of his medical history,  Face-to-face time with the patient , and post visit ordering of testing and therapeutics.    Sherlene Shams, MD

## 2023-10-16 ENCOUNTER — Emergency Department: Payer: No Typology Code available for payment source

## 2023-10-16 ENCOUNTER — Other Ambulatory Visit: Payer: Self-pay

## 2023-10-16 ENCOUNTER — Emergency Department
Admission: EM | Admit: 2023-10-16 | Discharge: 2023-10-16 | Disposition: A | Discharge status: Home / Self Care | Payer: No Typology Code available for payment source | Attending: Emergency Medicine | Admitting: Emergency Medicine

## 2023-10-16 DIAGNOSIS — I2782 Chronic pulmonary embolism: Secondary | ICD-10-CM | POA: Insufficient documentation

## 2023-10-16 DIAGNOSIS — W010XXA Fall on same level from slipping, tripping and stumbling without subsequent striking against object, initial encounter: Secondary | ICD-10-CM | POA: Insufficient documentation

## 2023-10-16 DIAGNOSIS — N189 Chronic kidney disease, unspecified: Secondary | ICD-10-CM | POA: Insufficient documentation

## 2023-10-16 DIAGNOSIS — Z7901 Long term (current) use of anticoagulants: Secondary | ICD-10-CM | POA: Insufficient documentation

## 2023-10-16 DIAGNOSIS — M25561 Pain in right knee: Secondary | ICD-10-CM | POA: Insufficient documentation

## 2023-10-16 DIAGNOSIS — I509 Heart failure, unspecified: Secondary | ICD-10-CM | POA: Insufficient documentation

## 2023-10-16 DIAGNOSIS — R7989 Other specified abnormal findings of blood chemistry: Secondary | ICD-10-CM | POA: Diagnosis not present

## 2023-10-16 LAB — COMPREHENSIVE METABOLIC PANEL
ALT: 23 U/L (ref 0–44)
AST: 25 U/L (ref 15–41)
Albumin: 3.5 g/dL (ref 3.5–5.0)
Alkaline Phosphatase: 52 U/L (ref 38–126)
Anion gap: 14 (ref 5–15)
BUN: 19 mg/dL (ref 6–20)
CO2: 22 mmol/L (ref 22–32)
Calcium: 8.7 mg/dL — ABNORMAL LOW (ref 8.9–10.3)
Chloride: 102 mmol/L (ref 98–111)
Creatinine, Ser: 1.68 mg/dL — ABNORMAL HIGH (ref 0.61–1.24)
GFR, Estimated: 48 mL/min — ABNORMAL LOW (ref >60)
Glucose, Bld: 139 mg/dL — ABNORMAL HIGH (ref 70–99)
Potassium: 3.7 mmol/L (ref 3.5–5.1)
Sodium: 138 mmol/L (ref 135–145)
Total Bilirubin: 1.2 mg/dL (ref 0.0–1.2)
Total Protein: 7.1 g/dL (ref 6.5–8.1)

## 2023-10-16 LAB — CBC
HCT: 39.4 % (ref 39.0–52.0)
Hemoglobin: 12.9 g/dL — ABNORMAL LOW (ref 13.0–17.0)
MCH: 30.4 pg (ref 26.0–34.0)
MCHC: 32.7 g/dL (ref 30.0–36.0)
MCV: 92.9 fL (ref 80.0–100.0)
Platelets: 262 10*3/uL (ref 150–400)
RBC: 4.24 MIL/uL (ref 4.22–5.81)
RDW: 14.6 % (ref 11.5–15.5)
WBC: 4.9 10*3/uL (ref 4.0–10.5)
nRBC: 0 % (ref 0.0–0.2)

## 2023-10-16 LAB — D-DIMER, QUANTITATIVE: D-Dimer, Quant: 5.28 ug{FEU}/mL — ABNORMAL HIGH (ref 0.00–0.50)

## 2023-10-16 LAB — PROTIME-INR
INR: 2.5 — ABNORMAL HIGH (ref 0.8–1.2)
Prothrombin Time: 27.1 s — ABNORMAL HIGH (ref 11.4–15.2)

## 2023-10-16 MED ORDER — MORPHINE SULFATE (PF) 4 MG/ML IV SOLN
4.0000 mg | Freq: Once | INTRAVENOUS | Status: AC
  Administered 2023-10-16: 4 mg via INTRAVENOUS
  Filled 2023-10-16 (×2): qty 1

## 2023-10-16 MED ORDER — HYDROCODONE-ACETAMINOPHEN 5-325 MG PO TABS: 1.0000 | ORAL_TABLET | Freq: Four times a day (QID) | ORAL | 0 refills | Status: AC | PRN

## 2023-10-16 MED ORDER — IOHEXOL 350 MG/ML SOLN
100.0000 mL | Freq: Once | INTRAVENOUS | Status: AC | PRN
  Administered 2023-10-16: 100 mL via INTRAVENOUS

## 2023-10-16 NOTE — Discharge Instructions (Signed)
You were seen in the ER today for evaluation of right knee pain after a fall.  Fortunately your imaging did not show evidence of a fracture.  If your symptoms are not improving in a few days, follow-up with orthopedics for further evaluation.  I sent a short course of pain medicine to your pharmacy.  You can take this as needed.  Do not drive or operate machinery when taking this.  Return to the ER for any new or worsening symptoms.

## 2023-10-16 NOTE — ED Triage Notes (Addendum)
Pt sts that he tripped on his PICC line tubing and fell on his right knee. Pt sts that his knee hurts and he is able to bear weight on it but not lift it. Pt sts that he has low o2 sometimes. Pt sts that he has o2 at home when needed. Pt currently has a Milrinone infusion going through his PICC at this time.

## 2023-10-16 NOTE — ED Provider Notes (Signed)
[facility] Provider Note    Event Date/Time   First MD Initiated Contact with Patient 10/16/23 1517     (approximate)   History   Fall   HPI  KORE MADLOCK is a 53 year old male with history of obesity, CKD, CHF, PE on warfarin presenting to the emergency department for evaluation of right knee pain.  Patient tripped on his PICC tubing and fell onto his right knee.  Did not hit his head.  Denies injury to other areas.  He was able to stand up and walk with a walker, but has been unable to lift his legs since he fell.  He denies any preceding chest pain, shortness of breath, lightheadedness.  Reports compliance with his warfarin, regular home INR checks, most recently 2.8, goal 2.5-3.5.  Noted to be hypoxic in triage, reports he wears oxygen as needed at night, does not usually wear during the day, was 86% on room air in triage.  I reviewed his discharge summary from 09/30/2023.  At that time, he presented with shortness of breath and chest pain.  He underwent right heart cath demonstrating pulmonary hypertension with reduced cardiac index and was placed on milrinone and sildenafil.  He was discharged on home milrinone and 2 to 3 L of supplemental oxygen.     Physical Exam   Triage Vital Signs: ED Triage Vitals  Encounter Vitals Group     BP 10/16/23 1345 129/80     Systolic BP Percentile --      Diastolic BP Percentile --      Pulse Rate 10/16/23 1345 (!) 110     Resp 10/16/23 1345 17     Temp 10/16/23 1345 98.3 F (36.8 C)     Temp Source 10/16/23 1345 Oral     SpO2 10/16/23 1345 (!) 86 %     Weight 10/16/23 1347 (!) 350 lb (158.8 kg)     Height 10/16/23 1347 5\' 7"  (1.702 m)     Head Circumference --      Peak Flow --      Pain Score 10/16/23 1347 8     Pain Loc --      Pain Education --      Exclude from Growth Chart --     Most recent vital signs: Vitals:   10/16/23 1535 10/16/23 1830  BP:  113/78  Pulse:  88  Resp:  16  Temp:  98.6 F  (37 C)  SpO2: 100% 94%     General: Awake, interactive  CV:  Regular rate at the time of my initial evaluation, good peripheral perfusion.  Resp:  Unlabored respirations, lung sounds mildly diminished Abd:  Nondistended Neuro:  Symmetric facial movement, fluid speech MSK:  Hips stable without tenderness to palpation.  There is tenderness over the anterior knee including over the tibial plateau, no tenderness over the medial lateral joint line, no appreciable joint laxity or pain with varus or valgus force.  Intact distal pulses.  Chronic lower extremity strength changes with nonpitting edema bilaterally.   ED Results / Procedures / Treatments   Labs (all labs ordered are listed, but only abnormal results are displayed) Labs Reviewed  CBC - Abnormal; Notable for the following components:      Result Value   Hemoglobin 12.9 (*)    All other components within normal limits  COMPREHENSIVE METABOLIC PANEL - Abnormal; Notable for the following components:   Glucose, Bld 139 (*)    Creatinine, Ser 1.68 (*)  Calcium 8.7 (*)    GFR, Estimated 48 (*)    All other components within normal limits  D-DIMER, QUANTITATIVE - Abnormal; Notable for the following components:   D-Dimer, Quant 5.28 (*)    All other components within normal limits  PROTIME-INR - Abnormal; Notable for the following components:   Prothrombin Time 27.1 (*)    INR 2.5 (*)    All other components within normal limits     EKG EKG independently reviewed interpreted by myself (ER attending) demonstrates:    RADIOLOGY Imaging independently reviewed and interpreted by myself demonstrates:  Knee x-Dontae Minerva without acute fracture CTA chest without acute PE CT right knee without acute fracture  PROCEDURES:  Critical Care performed: No  Procedures   MEDICATIONS ORDERED IN ED: Medications  morphine (PF) 4 MG/ML injection 4 mg (4 mg Intravenous Given 10/16/23 1730)  iohexol (OMNIPAQUE) 350 MG/ML injection 100 mL  (100 mLs Intravenous Contrast Given 10/16/23 1924)     IMPRESSION / MDM / ASSESSMENT AND PLAN / ED COURSE  I reviewed the triage vital signs and the nursing notes.  Differential diagnosis includes, but is not limited to, fracture, dislocation, soft tissue injury, recurrent PE, progressive pulmonary hypertension  Patient's presentation is most consistent with acute presentation with potential threat to life or bodily function.  53 year old male presenting after a fall with right knee pain, noted to be hypoxic with history of recurrent PEs.  Labs ordered from triage including D-dimer which returned significantly elevated at 5.28.  INR therapeutic at 2.5.  Slightly increased creatinine from baseline at 1.68.  Patient without significant new cardiorespiratory symptoms, but with hypoxia and history, will obtain CTA of the chest to further evaluate.  X-Liylah Najarro of the knee unremarkable, but with degree of pain localized around tibial plateau, will obtain CT to evaluate for occult fracture.  Ordered for pain control.  On my initial evaluation, patient on a nonrebreather satting at 100%.  Transitioned to 3 L nasal cannula, maintaining saturations.  CTA chest with chronic clot, no new clot noted.  CT of the knee without evidence of fracture.  Patient was reevaluated and reported improvement in his pain.  He was able to ambulate with a walker which she has available at home.  He is comfortable with discharge home.  Remained with adequate saturations on supplemental oxygen which she also has available at home.  Strict return precautions were provided.  Patient was discharged in stable condition.      FINAL CLINICAL IMPRESSION(S) / ED DIAGNOSES   Final diagnoses:  Acute pain of right knee  Other chronic pulmonary embolism, unspecified whether acute cor pulmonale present (HCC)     Rx / DC Orders   ED Discharge Orders          Ordered    HYDROcodone-acetaminophen (NORCO/VICODIN) 5-325 MG tablet  Every 6  hours PRN        10/16/23 2217             Note:  This document was prepared using Dragon voice recognition software and may include unintentional dictation errors.   Trinna Post, MD 10/16/23 2217

## 2023-10-16 NOTE — ED Provider Triage Note (Signed)
Emergency Medicine Provider Triage Evaluation Note  Ryan [name] , [name] 53 y.o. male  was evaluated in triage.  Pt complains of mechanical fall onto right knee. Patient tripped over his PICC line today. Pain is worse with ambulation and knee extension. Uses walker for assistance. Hx of multiple PE on warfarin   Review of Systems  Positive:  Negative:   Physical Exam  BP 129/80 (BP Location: Left Arm)   Pulse (!) 110   Temp 98.3 F (36.8 C) (Oral)   Resp 17   Ht 5\' 7"  (1.702 m)   Wt (!) 158.8 kg   SpO2 96%   BMI 54.82 kg/m  Gen:   Awake, no distress   Resp:  Normal effort  MSK:   Moves extremities without difficulty  Other:  Right knee tenderness on anterior patella   Medical Decision Making  Medically screening exam initiated at 1:55 PM.  Appropriate orders placed.  Ryan [name] was informed that the remainder of the evaluation will be completed by another provider, this initial triage assessment does not replace that evaluation, and the importance of remaining in the ED until their evaluation is complete.    Ryan [name], Ryan Loeber A, PA-C 10/16/23 1400

## 2023-10-17 ENCOUNTER — Telehealth: Payer: Self-pay | Admitting: Cardiology

## 2023-10-17 ENCOUNTER — Telehealth: Payer: Self-pay

## 2023-10-17 ENCOUNTER — Other Ambulatory Visit: Payer: Self-pay

## 2023-10-17 ENCOUNTER — Encounter: Payer: No Typology Code available for payment source | Admitting: Cardiology

## 2023-10-17 NOTE — Telephone Encounter (Signed)
 Pt confirmed appt for 10/18/23

## 2023-10-17 NOTE — Patient Outreach (Signed)
  Care Management  Transitions of Care Program Transitions of Care Post-discharge week 3  10/17/2023 Name: Ryan [name] MRN: [id number] DOB: 06-27-1971  Subjective: [name] is a 53 y.o. year old male who is a primary care patient of Sherlene Shams, MD. The Care Management team was unable to reach the patient by phone to assess and address transitions of care needs.   Plan: Additional outreach attempts will be made to reach the patient enrolled in the [facility] Program (Post Inpatient/ED Visit).  Deidre Ala, BSN, RN [place]  VBCI - [employer] [facility] RN Care Manager [phone]

## 2023-10-17 NOTE — Patient Outreach (Signed)
Care Management  Transitions of Care Program Transitions of Care Post-discharge week 3   10/17/2023 Name: Ryan [name] MRN: [id number] DOB: [birth year]  Subjective: Ryan [name] is a 53 y.o. year old male who is a primary care patient of Sherlene Shams, MD. The Care Management team Engaged with patient Engaged with patient by telephone to assess and address transitions of care needs.   Consent to Services:  Patient was given information about care management services, agreed to services, and gave verbal consent to participate.   Assessment: TOC Outreach to the patient today. He fell yesterday at home when he tripped up over his PICC line tubing and fell onto his kneecap. He went to the ED after church to make sure he didn't break it. He is on crutches and taking some pain medication. He continues with Milrinone drip and INR's at home which have been therapeutic. He may require physical therapy after a week or two. Appointment with HF clinic today.           SDOH Interventions    Flowsheet Row Patient Outreach from 10/10/2023 in [facility] Telephone from 10/03/2023 in [place] [facility] Telephone from 06/27/2023 in [place] [facility] DEPARTMENT  SDOH Interventions     Food Insecurity Interventions Intervention Not Indicated Intervention Not Indicated Intervention Not Indicated  Housing Interventions Intervention Not Indicated Intervention Not Indicated --  Transportation Interventions Intervention Not Indicated Intervention Not Indicated Intervention Not Indicated  Utilities Interventions Intervention Not Indicated Intervention Not Indicated --  Social Connections Interventions Intervention Not Indicated Intervention Not Indicated --        Goals Addressed             This Visit's Progress    TOC Care Plan       Current Barriers: (Reviewed 10/17/23) Chronic Disease Management support and education needs related to  CHF   RNCM Clinical Goal(s):  (Reviewed 10/17/23) Patient will work with the Care Management team over the next 30 days to address Transition of Care Barriers: Medication access Medication Management Diet/Nutrition/Food Resources Support at home Provider appointments Home Health services Equipment/DME verbalize basic understanding of  CHF disease process and self health management plan as evidenced by verbal teach back and no hospital readmissions  through collaboration with RN Care manager, provider, and care team.   Interventions:  (Reviewed 10/17/23) Evaluation of current treatment plan related to  self management and patient's adherence to plan as established by provider   Heart Failure Interventions:  (Status:  Goal on track:  Yes.) Short Term Goal   (Reviewed 10/17/23) Provided education on low sodium diet Assessed need for readable accurate scales in home Provided education about placing scale on hard, flat surface Advised patient to weigh each morning after emptying bladder Discussed importance of daily weight and advised patient to weigh and record daily Reviewed role of diuretics in prevention of fluid overload and management of heart failure; Discussed the importance of keeping all appointments with provider  Patient Goals/Self-Care Activities:  (Reviewed 10/17/23) Participate in Transition of Care Program/Attend [facility] scheduled calls Notify RN Care Manager of [facility] call rescheduling needs Take all medications as prescribed Attend all scheduled provider appointments Call pharmacy for medication refills 3-7 days in advance of running out of medications Call provider office for new concerns or questions   Follow Up Plan:  Telephone follow up appointment with care management team member scheduled for:  Monday February 24th at 10:00am  Plan: The patient has been provided with contact information for the care management team and has been advised to call with any health  related questions or concerns.   Please refer to Care Plan for goals and interventions.  Patient educated on red flags signs/symptoms to watch for and was encouraged to report any of these identified, any new symptoms, changes in baseline or medication regimen, change in health status / well-being, or safety concerns to PCP and / or the [facility] Case Management team.   The patient has been provided with contact information for the care management team and has been advised to call with any health-related questions or concerns. The patient verbalized understanding with current POC. The patient is directed to their insurance card regarding availability of benefits coverage.  Deidre Ala, BSN, RN [place]  [facility] - [employer] [facility] RN Care Manager [phone]

## 2023-10-17 NOTE — Patient Instructions (Signed)
Visit Information  Thank you for taking time to visit with me today. Please don't hesitate to contact me if I can be of assistance to you before our next scheduled telephone appointment.  Our next appointment is by telephone on Monday February 24 at 10AM  Following is a copy of your care plan:   Goals Addressed             This Visit's Progress    TOC Care Plan       Current Barriers: (Reviewed 10/17/23) Chronic Disease Management support and education needs related to CHF   RNCM Clinical Goal(s):  (Reviewed 10/17/23) Patient will work with the Care Management team over the next 30 days to address Transition of Care Barriers: Medication access Medication Management Diet/Nutrition/Food Resources Support at home Provider appointments Home Health services Equipment/DME verbalize basic understanding of  CHF disease process and self health management plan as evidenced by verbal teach back and no hospital readmissions  through collaboration with RN Care manager, provider, and care team.   Interventions:  (Reviewed 10/17/23) Evaluation of current treatment plan related to  self management and patient's adherence to plan as established by provider   Heart Failure Interventions:  (Status:  Goal on track:  Yes.) Short Term Goal   (Reviewed 10/17/23) Provided education on low sodium diet Assessed need for readable accurate scales in home Provided education about placing scale on hard, flat surface Advised patient to weigh each morning after emptying bladder Discussed importance of daily weight and advised patient to weigh and record daily Reviewed role of diuretics in prevention of fluid overload and management of heart failure; Discussed the importance of keeping all appointments with provider  Patient Goals/Self-Care Activities:  (Reviewed 10/17/23) Participate in Transition of Care Program/Attend [facility] scheduled calls Notify RN Care Manager of [facility] call rescheduling needs Take all  medications as prescribed Attend all scheduled provider appointments Call pharmacy for medication refills 3-7 days in advance of running out of medications Call provider office for new concerns or questions   Follow Up Plan:  Telephone follow up appointment with care management team member scheduled for:  Monday February 24th at 10:00am        Patient verbalizes understanding of instructions and care plan provided today and agrees to view in MyChart. Active MyChart status and patient understanding of how to access instructions and care plan via MyChart confirmed with patient.     The patient has been provided with contact information for the care management team and has been advised to call with any health related questions or concerns.   Please call the care guide team at [phone] if you need to cancel or reschedule your appointment.   Please call the Suicide and Crisis Lifeline: 988 call the [place] National Suicide Prevention Lifeline: 516-718-6027 or TTY: 212-660-3845 TTY (367)186-0841) to talk to a trained counselor if you are experiencing a Mental Health or Behavioral Health Crisis or need someone to talk to.  Deidre Ala, BSN, RN [place]  [facility] - [employer] [facility] RN Care Manager [phone]

## 2023-10-18 ENCOUNTER — Encounter: Payer: Self-pay | Admitting: Internal Medicine

## 2023-10-18 ENCOUNTER — Encounter: Payer: No Typology Code available for payment source | Admitting: Cardiology

## 2023-10-18 DIAGNOSIS — J9611 Chronic respiratory failure with hypoxia: Secondary | ICD-10-CM

## 2023-10-18 DIAGNOSIS — I2781 Cor pulmonale (chronic): Secondary | ICD-10-CM

## 2023-10-19 ENCOUNTER — Encounter: Payer: Self-pay | Admitting: Internal Medicine

## 2023-10-19 ENCOUNTER — Ambulatory Visit: Payer: No Typology Code available for payment source

## 2023-10-19 DIAGNOSIS — Z5181 Encounter for therapeutic drug level monitoring: Secondary | ICD-10-CM

## 2023-10-20 MED ORDER — WARFARIN SODIUM 1 MG PO TABS: ORAL_TABLET | ORAL | 0 refills | Status: DC

## 2023-10-20 NOTE — Telephone Encounter (Signed)
Copied from CRM [id number]. Topic: Clinical - Lab/Test Results >> Oct 20, 2023  3:38 PM Corin V wrote: Reason for CRM: Patient returned missed call to office. Relayed lab results. He has not missed a dose. He will start medication changes. Please send script to [facility] in [place].

## 2023-10-20 NOTE — Assessment & Plan Note (Signed)
INR is l2.3 on Feb 18 using  5 mg daily.  Advised to increase dose to 6 mg on Thursdays and Sundays, continue 5 mg all other days . Repeat inr one week   Lab Results  Component Value Date   INR 2.5 (H) 10/16/2023   INR 2.80 (A) 10/10/2023   INR 2.2 (H) 09/30/2023

## 2023-10-20 NOTE — Telephone Encounter (Signed)
Pt is in need of a hospital follow up nothing available until next Thursday or you want me to add him on Tuesday morning?

## 2023-10-21 ENCOUNTER — Other Ambulatory Visit: Payer: Self-pay

## 2023-10-21 MED ORDER — WARFARIN SODIUM 1 MG PO TABS: ORAL_TABLET | ORAL | 0 refills | Status: AC

## 2023-10-21 NOTE — Telephone Encounter (Signed)
Spoke with pt and he stated that he did see the message and is aware of the dose change.

## 2023-10-24 ENCOUNTER — Ambulatory Visit: Payer: No Typology Code available for payment source | Attending: Cardiology | Admitting: Pharmacist

## 2023-10-24 ENCOUNTER — Other Ambulatory Visit: Payer: Self-pay

## 2023-10-24 VITALS — BP 108/84 | HR 98 | Wt 361.0 lb

## 2023-10-24 DIAGNOSIS — I5032 Chronic diastolic (congestive) heart failure: Secondary | ICD-10-CM

## 2023-10-24 MED ORDER — TORSEMIDE 20 MG PO TABS
ORAL_TABLET | ORAL | 3 refills | Status: DC
Start: 2023-10-24 — End: 2023-12-16

## 2023-10-24 NOTE — Progress Notes (Addendum)
 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 Reports extensive family history of clotting disorders. He's had multiple DVTs/PES dating back to 38s. Per chart review, has MTHFR gene mutation and was previously followed by Hematology (Dr. Isaiah Serge). There has been some concern for poor compliance with anticoagulation. Hypercoagulable workup has been unrevealing.   He has had multiple hospitalizations in the past several months. 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  He underwent mechanical thrombectomy with vascular surgery, and reportedly felt significantly improved at discharge.     Admitted in 05/2023 when he began experiencing shortness of breath, cough, and new diarrhea and vomiting.  He presented to the hospital and was found to be febrile with an elevated lactate.  Vital signs were within normal limits, although oxygen requirement had increased.  He was admitted to the ICU for further management.  CTA showed persistent nonocclusive thrombus in the right main pulmonary artery as well as new small left upper lobe segmental PE. During that hospitalization he was diuresed with IV lasix, medications were optimized and he was discharged home.    Readmitted in 07/2023 with worsening dyspnea d/t acute on chronic RV failure. CTA with resolution of prior left-sided PE and stable chronic right-sided PE. He was diuresed and GDMT titrated. 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   He was scheduled for RHC 09/20/23, however, presented to ED early that day with concern for worsening dyspnea. Labs significant for INR 1.7 (warfarin recently cutting back after INR 8.1). Right heart catheterization  showed RA 16 mmHg, PA 103/26 mmHg (55 mean), PCWP 13 mmHg (mean), Estimated Fick CO/CI  3.16/1.24, TPG  32 mmHg, PVR  10.41 Wood Units, PAPi  4.8. Warfarin was held for hemoptysis, thought secondary to sildenafil. Resolved with dose reduction. Sildenafil was changed to Adempas. Milrinone started for low output heart failure and elevated PA pressures. Mixed venous gas on milrinone 0.25 mcg/kg/min was 64%. Discharged on home milrinone 0.25 mcg/kg/min, Adempas 1 mg TID, and torsemide 20 mg daily. Discharge weight was 333 lbs.  Today [name] returns to Heart Failure Clinic for pharmacist medication titration. Reports feeling poorly since suffering a mechanical fall on 10/15/22 due to milrinone tubing catching on something. He presented to the ED due to knee pain. No broken bones or dislocation noted on CT. Reports his fatigue and shortness of breath have been relatively stable, but has experienced worsening [place]. Denies dizziness lightheadedness chest pain palpitations orthopnea orthostasis PND. Denies being able to complete all activities of daily living (ADLs). Is not very active throughout the day since injuring his knee. Reports weight at home is ~340-350 pounds. Takes torsemide 40 mg daily. Appetite is fair. Somewhat follows a low sodium diet. BP at home this AM was 107/70.   Current Heart Failure/PH Medications: Loop diuretic: torsemide 40 mg daily Beta-Blocker: none ACEI/ARB/ARNI: none MRA: none SGLT2i: none Other: Adempas 1.5 mg TID, milrinone 0.25 mcg/kg/min   Has the patient been experiencing any side effects to the medications prescribed? Yes. Reported having some hemoptysis. Reports worsening acid reflux and [place] since starting Adempas. Acid reflux I controlled by eating earlier and taking PPI.  Does the patient have any problems obtaining medications due to transportation or finances? No  Understanding of regimen: Excellent  Understanding of indications: Excellent  Potential of  adherence: Good. Patient became confused and has been taking torsemide 40 mg (2 tablets) once daily rather than 20 mg daily. Patient is not taking digoxin currently as he though it was stopped at hospital discharge. Given that he is on home milrinone, will continue off digoxin for now.  Patient understands to avoid NSAIDs.  Patient understands to avoid decongestants.  Pertinent Lab Values: Creatinine  Date Value Ref Range Status  12/28/2014 5.55 (H) mg/dL Final    Comment:    4.09-8.11 NOTE: New Reference Range  11/05/14    Creatinine, Ser  Date Value Ref Range Status  10/16/2023 1.68 (H) 0.61 - 1.24 mg/dL Final   BUN  Date Value Ref Range Status  10/16/2023 19 6 - 20 mg/dL Final  91/47/8295 27 (H) 6 - 24 mg/dL Final  62/13/0865 57 (H) mg/dL Final    Comment:    7-84 NOTE: New Reference Range  11/05/14    Potassium  Date Value Ref Range Status  10/16/2023 3.7 3.5 - 5.1 mmol/L Final  12/28/2014 3.8 mmol/L Final    Comment:    3.5-5.1 NOTE: New Reference Range  11/05/14    Sodium  Date Value Ref Range Status  10/16/2023 138 135 - 145 mmol/L Final  08/26/2023 139 134 - 144 mmol/L Final  12/28/2014 130 (L) mmol/L Final    Comment:    135-145 NOTE: New Reference Range  11/05/14    B Natriuretic Peptide  Date Value Ref Range Status  09/19/2023 280.4 (H) 0.0 - 100.0 pg/mL Final    Comment:    Performed at [facility], [address] [place], [place] [address]   Magnesium  Date Value Ref Range Status  09/25/2023 2.3 1.7 - 2.4 mg/dL Final    Comment:    Performed at [facility], [address] [place], [place] [address]  12/28/2014 2.7 (H) mg/dL Final    Comment:    3.2-4.4 THERAPEUTIC RANGE: 4-7 mg/dL TOXIC: > 10 mg/dL  ----------------------- NOTE: New Reference Range  11/05/14  - MG-HEMOLYSIS AT THIS LEVEL MAY AFFECT THE RESULT    Digoxin Level  Date Value Ref Range Status  09/22/2023 0.3 (L) 0.8 - 2.0 ng/mL Final     Comment:    Performed at [facility], [address] [place], [place] [address]   TSH  Date Value Ref Range Status  08/19/2023 3.471 0.350 - 4.500 uIU/mL Final    Comment:    Performed by a 3rd Generation assay with a functional sensitivity of <=0.01 uIU/mL. Performed at [facility], 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 -Echo 09/20/23: EF 65-70%, RV moderately enlarged with mildly reduced function -WHO Group IV PH, WHO Class III symptoms -Chronic [place] due to two occluded IVC filters.  -Dry weight at hospital discharge was 333 lbs, now up to 361 lbs. Respiratory symptoms are stable, but has worsened lower extremity edema consistent with volume overload in RV dysfunction. Reports dietary indiscretion. Will  double torsemide to 80 mg daily for 3 days, then resume 40 mg daily. May adjust pending labs. -May be high risk for CTEPH intervention due to weight and disease severity. MD to consider placing [facility] referral. -Continue on milrinone 0.25 mcg/kg/min. No issues noted. Resting heart rate is now in 90s. Continue off digoxin. -Tolerating Adempas well. Recently increased to 1.5 mg TID. Will continue outpatient titration to goal of 2.5 mg TID.   2. Recurrent DVT/PE -MTHFR positive with extensive thrombosis history -More recent admits 11/2022 and 05/2023 with PE treated with thrombectomy -Has hx of 2 IVC filters. The lower filter appeared occluded on prior angiography with multiple lower venous collaterals. Vascular surgery previously felt he was not a  candidate for surgery. Suspect this has contributed to his lower extremity edema, chronic PE and "failure" of OAC. Difficult situation, even if CTEPH is treated he would be at high risk for future clots and ongoing lower extremity swelling. -Chronic, stable PE on CTA 10/16/23. -Continue Warfarin per PCP. Experienced mild hemoptysis. Check CBC today. Instructed patient to reach out to PCP to inform of hemoptysis.    3. Atrial fibrillation/flutter -Maintaining NSR  -on Warfarin w/ recently subtherapeutic INR  Dose increased to 6 mg on Thursdays and Sundays, 5 mg all other days.  -Not currently on BB given low output RV failure requiring milrinone.   4. CKD IIIa -Scr back up to 1.68 last check on 10/16/23. Recheck RFP today along with BNP.  Follow up: 11/14/23 with Dr. Elwyn Lade. Patient aware he should call back if continuing to gain weight or if symptoms worsen.  Please do not hesitate to reach out with questions or concerns,  Enos Fling, PharmD, CPP, BCPS Heart Failure Pharmacist  Phone - [phone] 10/24/2023 2:55 PM

## 2023-10-24 NOTE — Patient Instructions (Signed)
 Visit Information  Thank you for taking time to visit with me today. Please don't hesitate to contact me if I can be of assistance to you before our next scheduled telephone appointment.  Our next appointment is by telephone on Monday March 3rd at 10am  Following is a copy of your care plan:   Goals Addressed             This Visit's Progress    TOC Care Plan       Current Barriers: (Reviewed 10/24/23) Chronic Disease Management support and education needs related to CHF   RNCM Clinical Goal(s):  (Reviewed 10/24/23) Patient will work with the Care Management team over the next 30 days to address Transition of Care Barriers: Medication access Medication Management Diet/Nutrition/Food Resources Provider appointments Equipment/DME verbalize basic understanding of  CHF disease process and self health management plan as evidenced by verbal teach back and no hospital readmissions  through collaboration with RN Care manager, provider, and care team.   Interventions:  (Reviewed 10/17/23) Evaluation of current treatment plan related to  self management and patient's adherence to plan as established by provider   Heart Failure Interventions:  (Status:  Goal on track:  Yes.) Short Term Goal   (Reviewed 10/24/23) Discussed importance of daily weight and advised patient to weigh and record daily Reviewed role of diuretics in prevention of fluid overload and management of heart failure; Discussed the importance of keeping all appointments with provider  Patient Goals/Self-Care Activities:  (Reviewed 10/24/23) Participate in Transition of Care Program/Attend [facility] scheduled calls Notify RN Care Manager of [facility] call rescheduling needs Take all medications as prescribed Attend all scheduled provider appointments Call pharmacy for medication refills 3-7 days in advance of running out of medications Call provider office for new concerns or questions   Follow Up Plan:  Telephone follow up appointment  with care management team member scheduled for:  Monday March 3 at 10:00am        Patient verbalizes understanding of instructions and care plan provided today and agrees to view in MyChart. Active MyChart status and patient understanding of how to access instructions and care plan via MyChart confirmed with patient.     The patient has been provided with contact information for the care management team and has been advised to call with any health related questions or concerns.   Please call the care guide team at [phone] if you need to cancel or reschedule your appointment.   Please call the Suicide and Crisis Lifeline: 988 call the [place] National Suicide Prevention Lifeline: 931-435-9998 or TTY: (901) 447-9084 TTY (445)499-7599) to talk to a trained counselor if you are experiencing a Mental Health or Behavioral Health Crisis or need someone to talk to.  Deidre Ala, BSN, RN [place]  [facility]BCI - [employer] [facility] RN Care Manager [phone]

## 2023-10-24 NOTE — Patient Instructions (Addendum)
 It was a pleasure seeing you today!  MEDICATIONS: -We are changing your medications today -Increase torsemide to 80 mg (4 tablets) daily for 3 days, then resume 40 mg (2 tablets) daily afterwards. The clinic staff will reach out if today's lab results warrant further adjustments. -Call if you have questions about your medications.  LABS: -We will call you if your labs need attention.  NEXT APPOINTMENT: Return to clinic 11/14/23 with Dr. Elwyn Lade.  In general, to take care of your heart failure: -Limit your fluid intake to 2 Liters (half-gallon) per day.   -Limit your salt intake to ideally 2-3 grams (2000-3000 mg) per day. -Weigh yourself daily and record, and bring that "weight diary" to your next appointment.  (Weight gain of 2-3 pounds in 1 day typically means fluid weight.) -The medications for your heart are to help your heart and help you live longer.   -Please contact us before stopping any of your heart medications.

## 2023-10-24 NOTE — Patient Outreach (Unsigned)
 Care Management  Transitions of Care Program Transitions of Care Post-discharge week 4   10/24/2023 Name: Ryan [name] MRN: [id number] DOB: June 03, 1971  Subjective: Ryan [name] is a 53 y.o. year old male who is a primary care patient of Sherlene Shams, MD. The Care Management team Engaged with patient Engaged with patient by telephone to assess and address transitions of care needs.   Consent to Services:  Patient was given information about care management services, agreed to services, and gave verbal consent to participate.   Assessment: TOC outreach completed to the patient today. He states his knee pain from his fall is improving but he still needs the crutches to help him walk. He has an appointment today with the Heart Failure clinic to discuss his treatment plan in regards to the Milrinone. He continues to take his INR on Monday and it is at 2.3. Some adjustments have been made in regards to his Coumadin. He states he can feel when his body is in need of oxygen and he wears it most nights and occasionally during the day. He would like portable oxygen on the possibility he would need it when he is not close to the concentrator. He has an appointment with PCP on 10/27/23. RNCM to scheduled one more appointment and then discharge from [facility] program.    SDOH Interventions    Flowsheet Row Patient Outreach from 10/10/2023 in [place] POPULATION HEALTH DEPARTMENT Telephone from 10/03/2023 in [place] POPULATION HEALTH DEPARTMENT Telephone from 06/27/2023 in [place] POPULATION HEALTH DEPARTMENT  SDOH Interventions     Food Insecurity Interventions Intervention Not Indicated Intervention Not Indicated Intervention Not Indicated  Housing Interventions Intervention Not Indicated Intervention Not Indicated --  Transportation Interventions Intervention Not Indicated Intervention Not Indicated Intervention Not Indicated  Utilities Interventions Intervention Not Indicated Intervention Not  Indicated --  Social Connections Interventions Intervention Not Indicated Intervention Not Indicated --        Goals Addressed             This Visit's Progress    TOC Care Plan       Current Barriers: (Reviewed 10/24/23) Chronic Disease Management support and education needs related to CHF   RNCM Clinical Goal(s):  (Reviewed 10/24/23) Patient will work with the Care Management team over the next 30 days to address Transition of Care Barriers: Medication access Medication Management Diet/Nutrition/Food Resources Provider appointments Equipment/DME verbalize basic understanding of  CHF disease process and self health management plan as evidenced by verbal teach back and no hospital readmissions  through collaboration with RN Care manager, provider, and care team.   Interventions:  (Reviewed 10/17/23) Evaluation of current treatment plan related to  self management and patient's adherence to plan as established by provider   Heart Failure Interventions:  (Status:  Goal on track:  Yes.) Short Term Goal   (Reviewed 10/24/23) Discussed importance of daily weight and advised patient to weigh and record daily Reviewed role of diuretics in prevention of fluid overload and management of heart failure; Discussed the importance of keeping all appointments with provider  Patient Goals/Self-Care Activities:  (Reviewed 10/24/23) Participate in Transition of Care Program/Attend [facility] scheduled calls Notify RN Care Manager of [facility] call rescheduling needs Take all medications as prescribed Attend all scheduled provider appointments Call pharmacy for medication refills 3-7 days in advance of running out of medications Call provider office for new concerns or questions   Follow Up Plan:  Telephone follow up appointment with care management team  member scheduled for:  Monday March 3 at 10:00am        Plan: The patient has been provided with contact information for the care management team and has  been advised to call with any health related questions or concerns.   Deidre Ala, BSN, RN [place]  VBCI - [employer] [facility] RN Care Manager [phone]

## 2023-10-24 NOTE — Addendum Note (Signed)
 Addended by: Marygrace Drought on: 10/24/2023 03:14 PM   Modules accepted: Orders

## 2023-10-25 ENCOUNTER — Encounter: Payer: Self-pay | Admitting: Pharmacist

## 2023-10-25 ENCOUNTER — Encounter: Payer: Self-pay | Admitting: Internal Medicine

## 2023-10-25 ENCOUNTER — Other Ambulatory Visit (HOSPITAL_COMMUNITY): Payer: Self-pay

## 2023-10-25 LAB — RENAL FUNCTION PANEL
Albumin: 3.9 g/dL (ref 3.8–4.9)
BUN/Creatinine Ratio: 8 — ABNORMAL LOW (ref 9–20)
BUN: 12 mg/dL (ref 6–24)
CO2: 23 mmol/L (ref 20–29)
Calcium: 9.3 mg/dL (ref 8.7–10.2)
Chloride: 105 mmol/L (ref 96–106)
Creatinine, Ser: 1.43 mg/dL — ABNORMAL HIGH (ref 0.76–1.27)
Glucose: 99 mg/dL (ref 70–99)
Phosphorus: 3.2 mg/dL (ref 2.8–4.1)
Potassium: 4.6 mmol/L (ref 3.5–5.2)
Sodium: 143 mmol/L (ref 134–144)
eGFR: 58 mL/min/{1.73_m2} — ABNORMAL LOW (ref >59)

## 2023-10-25 LAB — CBC
Hematocrit: 41.7 % (ref 37.5–51.0)
Hemoglobin: 13.6 g/dL (ref 13.0–17.7)
MCH: 30.1 pg (ref 26.6–33.0)
MCHC: 32.6 g/dL (ref 31.5–35.7)
MCV: 92 fL (ref 79–97)
Platelets: 333 10*3/uL (ref 150–450)
RBC: 4.52 x10E6/uL (ref 4.14–5.80)
RDW: 13.4 % (ref 11.6–15.4)
WBC: 4.1 10*3/uL (ref 3.4–10.8)

## 2023-10-25 LAB — BRAIN NATRIURETIC PEPTIDE: BNP: 84.4 pg/mL (ref 0.0–100.0)

## 2023-10-26 ENCOUNTER — Encounter: Payer: Self-pay | Admitting: Internal Medicine

## 2023-10-26 DIAGNOSIS — Z5181 Encounter for therapeutic drug level monitoring: Secondary | ICD-10-CM

## 2023-10-26 LAB — POCT INR: INR: 3.4 — AB (ref 0.80–1.20)

## 2023-10-26 NOTE — Assessment & Plan Note (Addendum)
 INR is 3.4 , therapeutic on regimen of  6 mg on Thursdays and Sundays,  5 mg all other days . Repeat inr one week   Lab Results  Component Value Date   INR 3.40 (A) 10/26/2023   INR 2.5 (H) 10/16/2023   INR 2.80 (A) 10/10/2023

## 2023-10-27 ENCOUNTER — Ambulatory Visit: Payer: No Typology Code available for payment source

## 2023-10-27 ENCOUNTER — Inpatient Hospital Stay: Payer: No Typology Code available for payment source | Admitting: Internal Medicine

## 2023-10-27 NOTE — Telephone Encounter (Signed)
Order has been faxed to [facility]

## 2023-10-31 ENCOUNTER — Other Ambulatory Visit: Payer: Self-pay

## 2023-10-31 ENCOUNTER — Telehealth: Payer: Self-pay

## 2023-10-31 DIAGNOSIS — I5020 Unspecified systolic (congestive) heart failure: Secondary | ICD-10-CM

## 2023-10-31 NOTE — Telephone Encounter (Signed)
 Noted.

## 2023-10-31 NOTE — Patient Instructions (Signed)
 Visit Information  Thank you for taking time to visit with me today. Please don't hesitate to contact me if I can be of assistance to you before our next scheduled telephone appointment.   Following is a copy of your care plan:   Goals Addressed             This Visit's Progress    COMPLETED: TOC Care Plan       Current Barriers: (Reviewed 10/31/23) Chronic Disease Management support and education needs related to CHF   RNCM Clinical Goal(s):   (Reviewed 10/31/23) Patient will verbalize basic understanding of  CHF disease process and self health management plan as evidenced by verbal teach back and no hospital readmissions  through collaboration with RN Care manager, provider, and care team.   Interventions:   (Reviewed 10/31/23) Evaluation of current treatment plan related to  self management and patient's adherence to plan as established by provider   Heart Failure Interventions:  (Status:  Goal Met.) Short Term Goal    (Reviewed 10/31/23) Discussed importance of daily weight and advised patient to weigh and record daily Reviewed role of diuretics in prevention of fluid overload and management of heart failure; Discussed the importance of keeping all appointments with provider  Patient Goals/Self-Care Activities:   (Reviewed 10/31/23) Participate in Transition of Care Program/Attend [facility] scheduled calls Notify RN Care Manager of TOC call rescheduling needs Take all medications as prescribed Attend all scheduled provider appointments Call pharmacy for medication refills 3-7 days in advance of running out of medications  Follow Up Plan:  The patient has met his goals. Transferring to the CCM Longitudinal Program        Patient verbalizes understanding of instructions and care plan provided today and agrees to view in MyChart. Active MyChart status and patient understanding of how to access instructions and care plan via MyChart confirmed with patient.     The patient has been  provided with contact information for the care management team and has been advised to call with any health related questions or concerns.   Please call the care guide team at [phone] if you need to cancel or reschedule your appointment.   Please call the Suicide and Crisis Lifeline: 988 call the [place] National Suicide Prevention Lifeline: 402-370-0011 or TTY: 4243387577 TTY 937-698-2774) to talk to a trained counselor if you are experiencing a Mental Health or Behavioral Health Crisis or need someone to talk to.  Deidre Ala, BSN, RN [place]  VBCI - [employer] [facility] RN Care Manager [phone]

## 2023-10-31 NOTE — Telephone Encounter (Signed)
 Called patient gave number for billing.[phone]

## 2023-10-31 NOTE — Telephone Encounter (Signed)
 Copied from CRM [id number]. Topic: General - Billing Inquiry >> Oct 31, 2023  9:54 AM Alcus Dad wrote: Reason for CRM: Patient is trying to take care of a bill

## 2023-10-31 NOTE — Patient Outreach (Signed)
 Care Management  Transitions of Care Program Transitions of Care Post-discharge week 5   10/31/2023 Name: Ryan [name] MRN: [id number] DOB: 1971-07-23  Subjective: Ryan [name] is a 53 y.o. year old male who is a primary care patient of Ryan Shams, MD. The Care Management team Engaged with patient Engaged with patient by telephone to assess and address transitions of care needs.   Consent to Services:  Patient was given information about care management services, agreed to services, and gave verbal consent to participate.   Assessment: TOC Outreach to the patient today. He went to the Heart Failure clinic and his Milirone has been titrated down a bit. He is feeling better about it. His knee pain from his fall a couple of weeks ago is still hurting him and he is still using crutches. He is waiting to hear from [facility] regarding his portable oxygen. He states he isn't driving yet due to his knee but hope to so that soon. The patient has completed his [facility] Outreach and is agreeable to continued follow up with the CCM Longitudinal program.      SDOH Interventions    Flowsheet Row Patient Outreach from 10/10/2023 in [place] POPULATION HEALTH DEPARTMENT Telephone from 10/03/2023 in [place] POPULATION HEALTH DEPARTMENT Telephone from 06/27/2023 in [place] POPULATION HEALTH DEPARTMENT  SDOH Interventions     Food Insecurity Interventions Intervention Not Indicated Intervention Not Indicated Intervention Not Indicated  Housing Interventions Intervention Not Indicated Intervention Not Indicated --  Transportation Interventions Intervention Not Indicated Intervention Not Indicated Intervention Not Indicated  Utilities Interventions Intervention Not Indicated Intervention Not Indicated --  Social Connections Interventions Intervention Not Indicated Intervention Not Indicated --        Goals Addressed             This Visit's Progress    COMPLETED: TOC Care Plan       Current  Barriers: (Reviewed 10/31/23) Chronic Disease Management support and education needs related to CHF   RNCM Clinical Goal(s):   (Reviewed 10/31/23) Patient will verbalize basic understanding of  CHF disease process and self health management plan as evidenced by verbal teach back and no hospital readmissions  through collaboration with RN Care manager, provider, and care team.   Interventions:   (Reviewed 10/31/23) Evaluation of current treatment plan related to  self management and patient's adherence to plan as established by provider   Heart Failure Interventions:  (Status:  Goal Met.) Short Term Goal    (Reviewed 10/31/23) Discussed importance of daily weight and advised patient to weigh and record daily Reviewed role of diuretics in prevention of fluid overload and management of heart failure; Discussed the importance of keeping all appointments with provider  Patient Goals/Self-Care Activities:   (Reviewed 10/31/23) Participate in Transition of Care Program/Attend [facility] scheduled calls Notify RN Care Manager of TOC call rescheduling needs Take all medications as prescribed Attend all scheduled provider appointments Call pharmacy for medication refills 3-7 days in advance of running out of medications  Follow Up Plan:  The patient has met his goals. Transferring to the CCM Longitudinal Program        Plan: The patient has been provided with contact information for the care management team and has been advised to call with any health related questions or concerns.   Deidre Ala, BSN, RN [place]  VBCI - [employer] [facility] RN Care Manager [phone]

## 2023-11-01 ENCOUNTER — Telehealth: Payer: Self-pay | Admitting: Pharmacist

## 2023-11-01 ENCOUNTER — Encounter: Payer: Self-pay | Admitting: Internal Medicine

## 2023-11-01 ENCOUNTER — Telehealth: Payer: Self-pay

## 2023-11-01 NOTE — Telephone Encounter (Signed)
 Spoke with [facility] specialty RN. Patient tolerating Adempas well. Increasing to 2 mg TID.

## 2023-11-01 NOTE — Telephone Encounter (Signed)
Error

## 2023-11-02 ENCOUNTER — Encounter: Payer: Self-pay | Admitting: Internal Medicine

## 2023-11-03 ENCOUNTER — Ambulatory Visit: Payer: No Typology Code available for payment source

## 2023-11-03 ENCOUNTER — Telehealth: Payer: Self-pay | Admitting: *Deleted

## 2023-11-03 NOTE — Progress Notes (Signed)
 Complex Care Management Note  Care Guide Note 11/03/2023 Name: Ryan [name] MRN: [id number] DOB: [birth year]  Ryan [name] is a 53 y.o. year old male who sees Darrick Huntsman, Mar Daring, MD for primary care. I reached out to Ryan [name] by phone today to offer complex care management services.  [name] was given information about Complex Care Management services today including:   The Complex Care Management services include support from the care team which includes your Nurse Care Manager, Clinical Social Worker, or Pharmacist.  The Complex Care Management team is here to help remove barriers to the health concerns and goals most important to you. Complex Care Management services are voluntary, and the patient may decline or stop services at any time by request to their care team member.   Complex Care Management Consent Status: Patient agreed to services and verbal consent obtained.   Follow up plan:  Telephone appointment with complex care management team member scheduled for:  11/22/2023  Encounter Outcome:  Patient Scheduled  Burman Nieves, CMA, Care Guide [facility]  [facility] Guide Direct Dial: [phone]  Fax: [phone] Website: [email or web address]

## 2023-11-11 ENCOUNTER — Encounter: Payer: Self-pay | Admitting: Internal Medicine

## 2023-11-11 ENCOUNTER — Telehealth: Payer: Self-pay | Admitting: Cardiology

## 2023-11-11 LAB — POCT INR

## 2023-11-11 NOTE — Telephone Encounter (Signed)
 Pt confirmed appt for 11/14/23

## 2023-11-13 ENCOUNTER — Encounter: Payer: Self-pay | Admitting: Internal Medicine

## 2023-11-14 ENCOUNTER — Encounter: Payer: Self-pay | Admitting: Cardiology

## 2023-11-14 ENCOUNTER — Ambulatory Visit: Payer: No Typology Code available for payment source | Attending: Cardiology | Admitting: Cardiology

## 2023-11-14 ENCOUNTER — Other Ambulatory Visit: Payer: Self-pay

## 2023-11-14 VITALS — BP 125/74 | HR 86 | Wt 357.2 lb

## 2023-11-14 DIAGNOSIS — I5032 Chronic diastolic (congestive) heart failure: Secondary | ICD-10-CM

## 2023-11-14 DIAGNOSIS — I5022 Chronic systolic (congestive) heart failure: Secondary | ICD-10-CM

## 2023-11-14 DIAGNOSIS — I2699 Other pulmonary embolism without acute cor pulmonale: Secondary | ICD-10-CM | POA: Diagnosis not present

## 2023-11-14 DIAGNOSIS — I50810 Right heart failure, unspecified: Secondary | ICD-10-CM

## 2023-11-14 DIAGNOSIS — I4891 Unspecified atrial fibrillation: Secondary | ICD-10-CM | POA: Diagnosis not present

## 2023-11-14 NOTE — Patient Instructions (Addendum)
    You are scheduled for a Cardiac Catheterization on Friday, April 18 with Dr.  Elwyn Lade .  1. Please arrive at the [facility] (Main Entrance A) at [facility]: [address] [place], [place] [address] at 5:30 AM (This time is 2 hour(s) before your procedure to ensure your preparation).   Free valet parking service is available. You will check in at ADMITTING. The support person will be asked to wait in the waiting room.  It is OK to have someone drop you off and come back when you are ready to be discharged.    Special note: Every effort is made to have your procedure done on time. Please understand that emergencies sometimes delay scheduled procedures.  2. Diet: Do not eat solid foods after midnight.  The patient may have clear liquids until 5am upon the day of the procedure.  3. Medication instructions in preparation for your procedure:   Contrast Allergy: No   HOLD YOUR TORSEMIDE THE MORNING OF YOUR PROCEDURE   On the morning of your procedure, take any morning medicines NOT listed above.  You may use sips of water.  5. Plan to go home the same day, you will only stay overnight if medically necessary. 6. Bring a current list of your medications and current insurance cards. 7. You MUST have a responsible person to drive you home. 8. Someone MUST be with you the first 24 hours after you arrive home or your discharge will be delayed. 9. Please wear clothes that are easy to get on and off and wear slip-on shoes.   Your physician recommends that you schedule a follow-up appointment in: 1 MONTH.  At the Advanced [facility], you and your health needs are our priority. As part of our continuing mission to provide you with exceptional heart care, we have created designated Provider Care Teams. These Care Teams include your primary Cardiologist (physician) and Advanced Practice Providers (APPs- Physician Assistants and Nurse Practitioners) who all work together to provide  you with the care you need, when you need it.   You may see any of the following providers on your designated Care Team at your next follow up: Dr Arvilla Meres Dr Marca Ancona Dr. Dorthula Nettles Dr. [name] Amy Filbert Schilder, NP Robbie Lis, [place] [facility] [place], [place] Brynda Peon, NP [place] Rubin, NP Clarisa Kindred, NP Karle Plumber, PharmD Enos Fling, PharmD   Please be sure to bring in all your medications bottles to every appointment.    Thank you for choosing [facility]

## 2023-11-14 NOTE — Progress Notes (Unsigned)
 ADVANCED HEART FAILURE FOLLOW UP CLINIC NOTE  Referring Physician: Sherlene Shams, MD  Primary Care: Sherlene Shams, MD Primary Cardiologist:  HPI: Ryan [name] is a 53 y.o. male who presents for follow up of pulmonary hypertension.      Patient seen after admission in September 2024 for recurrent pulmonary embolism. He underwent mechanical thrombectomy with vascular surgery, and reportedly felt significantly improved at discharge. He was discharged on warfarin and reports that he has been compliant with his medical therapy since that time. He has been followed in wound care for a chronic left lower extremity ulcer, but had been told that it was improving.   He has an extensive family history of clotting disorders and has been on Xarelto and warfarin in the past.  He reports that the men in his family are extremely prone to clotting.   "Follows with Hematology (Dr. Isaiah Serge), last seen in 2018. 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 Also strong family history of clotting. Work-up for hypercoagulability has been unrevealing thus far. Has had prior admissions for lytic therapy at [facility] in 11/2014 (when he had complete occlusion of the right common femoral vein) and in 12/2016 (when he had a large PE in the right main pulmonary artery causing bilateral multiple segmental and subsegmental pulmonary arteries). 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  MTHFR gene mutation per history    - 18 (53 yo) - RLE DVT. Treated with warfarin.  - 1995 - LLE DVT, off warfarin.  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 S/p IVC filter x 2 (2006, 2012).  - 2016: Ilio-femoral DVT,  treated with tPA. Thrombus extended along common iliac veins bilaterally and along IVC to 3.7 cm above the level of the IVC filter. Hospitalization c/b CIN/ARF requiring CRRT.  - 11/2016: PE in the setting of non-adherence to eliquis x 3 days. Treated with warfarin - 05/2017: Reported several interval VTE events. Negative thrombophelia eval (including prothrombin gene mutation).  - 09/2020: Admitted for PE  -11/2022: PE with thrombectomy -05/2023: PE with thrombectomy  During most recent admission in January he underwent right heart catheterization that showed severely elevated filling pressures as well as severely reduced cardiac index.  He was placed on IV milrinone with extensive diuresis.  Transition to riociguat at discharge.       SUBJECTIVE:  Patient reports feeling okay since he was last seen in the hospital.  He still is short of breath with mild to moderate exertion, but it is not improved from prior and he does feel better on the milrinone.  He feels his fluid is better controlled.  He has been compliant with the milrinone and Adempas and his PICC line site is dressed appropriately.  We discussed long-term of the need for likely pulmonary intervention, but that his occluded IVC makes surgical risk prohibitively high.  PMH, current medications, allergies, social history, and family history reviewed in epic.  PHYSICAL EXAM: Vitals:   11/14/23 1348  BP: 125/74  Pulse: 86  SpO2: 92%   GENERAL: Chronically ill-appearing PULM:  Normal work of breathing,   CARDIAC:  JVP: Mildly elevated         Normal rate with regular rhythm.  Systolic  murmur, chronic, woody appearing nonpitting edema, massive bilaterally to his groin ABDOMEN: Soft, non-tender, non-distended. NEUROLOGIC: Patient is oriented x3 with no focal or lateralizing neurologic deficits.    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  Currently on Adempas and IV milrinone.  NYHA class IIIb symptoms.  Will plan to pursue pulmonary angiography with repeat right heart catheterization.  Based on this, is likely not a surgical candidate but could consider referral to [facility] for pulmonary angioplasty. -Continue Adempas 2 mg 3 times daily -Continue milrinone 0.25 mcg/kg/min -Right heart catheterization with pulmonary angiography, risks and benefits discussed with patient.  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  Has been evaluated by vascular surgery in the past, would require open surgery and vena cava reconstruction.  Given his pulmonary hypertension is not a surgical candidate. -Fluid mobilization as able  CKD stage IIIa - Stable near baseline at 1.43 on most recent set of labs  Obesity: Multifactorial. - Continue GLP-1  Follow up in 2 months  [name], MD Advanced Heart Failure Mechanical Circulatory Support 11/18/23

## 2023-11-16 ENCOUNTER — Ambulatory Visit: Payer: No Typology Code available for payment source | Admitting: Pulmonary Disease

## 2023-11-16 ENCOUNTER — Encounter: Payer: Self-pay | Admitting: Pulmonary Disease

## 2023-11-16 VITALS — BP 126/86 | HR 95 | Temp 97.1°F | Ht 67.0 in | Wt 359.4 lb

## 2023-11-16 DIAGNOSIS — G4733 Obstructive sleep apnea (adult) (pediatric): Secondary | ICD-10-CM | POA: Diagnosis not present

## 2023-11-17 NOTE — Progress Notes (Signed)
 Synopsis: Referred in by Sherlene Shams, MD   Subjective:   PATIENT ID: Ryan [name] GENDER: male DOB: 20-Sep-1970, MRN: [id number]  Chief Complaint  Patient presents with   Follow-up    SOB comes and goes. No wheezing or cough.     HPI Ryan [name] is a 53 year old male patient with a past medical history of recurrent DVT/PE presenting to the pulmonary clinic today to establish care.   He was first diagnosed with a lower extremity DVT at the age of 72 which was with Coumadin.  He had extensive workup at [facility] hematology clinic with no clear hypercoagulable state.  He has had multiple IVC filters and reports that one of them has been there for 10 years and has not been removed.  He was tried on [employer] which have failed and now settled on Coumadin.  More recently, in February 2023 he ran out of Xarelto and was found to have recurrent pulmonary embolism with severe right heart strain at [facility].  In May 2023 CT chest angiogram showed right pulmonary artery branch chronic PE but no acute PE.  Ultrasound of the lower extremity demonstrated nonocclusive thrombus in both legs.  He was continued on Xarelto.  Fast forward to April 2024 he presented to the emergency department with increased work of breathing and a cough and was found to have acute on chronic PE with right main pulmonary thrombus superimposed on bulky chronic clot which is seen adherent to the vessel wall.  He underwent mechanical thrombectomy and was switched from Xarelto to Coumadin for an INR goal 2-3.  Unfortunately he returns in September 2024 for shortness of breath, tachycardia and decreased oxygen saturation.  CTA was done then which redemonstrated a large clot in the main right pulmonary artery with extension of the clot into the right upper lobe lobar pulmonary artery.  There is interval increase in the peripheral nonocclusive thrombus in the left lung lower lobe.  CTA chest showed right heart strain.  He underwent mechanical  thrombectomy again on 09/17.  He was discharged on Coumadin.  Echocardiogram on 09/17 prior to thrombectomy showed an LVEF of 60 to 65%.  Moderately reduced right ventricular systolic function and a moderately enlarged right ventricular size.  There was tricuspid regurgitation but could not be quantified.  He presents today to the pulmonary clinic for second opinion and to establish care.  He reports that he still has some shortness of breath on exertion with episode of desaturation.  He denies any chest pain palpitations cough or sputum production.  He denies any fever or chills.  I have referred him during the last to [facility] CTEPH clinic but unfortunately he was admitted to [facility] in January 2025 with decompensated RV failure.  Right heart cath On 09/19/2018 RA 16, RV 103/9, PA 103/26 mean PA 55, wedge pressure of 13.  PVR 10.4 Woods unit.  Cardiac output 3.16 L/min cardiac index 1.24 L/min.   He is currently maintained on milrinone drip at 0.25 mcg/kg/min.  Started on riociguat 1 mg 3 times daily and torsemide 40 mg daily.   Reports feeling well overall, dyspnea has improved and he is amble to ambulate more efficiently with support. He is able to participate in the cardiac rehab and is working on [employer].    Family history: Denies any family history of pulmonary disease   Social history:  Never smoker   ROS All systems have been reviewed and are negative except for the above. Objective:  Vitals:   11/16/23 1556  BP: 126/86  Pulse: 95  Temp: (!) 97.1 F (36.2 C)  SpO2: 90%  Weight: (!) 359 lb 6.4 oz (163 kg)  Height: 5\' 7"  (1.702 m)   90% on RA BMI Readings from Last 3 Encounters:  11/16/23 56.29 kg/m  11/14/23 55.94 kg/m  10/24/23 56.54 kg/m   Wt Readings from Last 3 Encounters:  11/16/23 (!) 359 lb 6.4 oz (163 kg)  11/14/23 (!) 357 lb 3 oz (162 kg)  10/24/23 (!) 361 lb (163.7 kg)    Physical Exam GEN: NAD, Morbidly Obese HEENT: Supple Neck, Reactive Pupils, EOMI   CVS: Normal S1, Normal S2, RRR, No murmurs or ES appreciated  Lungs: Clear bilateral air entry.  Abdomen: Soft, non tender, non distended, + BS  Extremities: Warm and well perfused, trace edema   Ancillary Information   CBC    Component Value Date/Time   WBC 4.1 10/24/2023 1410   WBC 4.9 10/16/2023 1400   RBC 4.52 10/24/2023 1410   RBC 4.24 10/16/2023 1400   HGB 13.6 10/24/2023 1410   HCT 41.7 10/24/2023 1410   PLT 333 10/24/2023 1410   MCV 92 10/24/2023 1410   MCV 90 12/28/2014 0444   MCH 30.1 10/24/2023 1410   MCH 30.4 10/16/2023 1400   MCHC 32.6 10/24/2023 1410   MCHC 32.7 10/16/2023 1400   RDW 13.4 10/24/2023 1410   RDW 14.0 12/28/2014 0444   LYMPHSABS 2.3 09/19/2023 2216   LYMPHSABS 1.6 07/04/2023 0850   LYMPHSABS 1.3 12/28/2014 0444   MONOABS 0.6 09/19/2023 2216   MONOABS 2.1 (H) 12/28/2014 0444   EOSABS 0.1 09/19/2023 2216   EOSABS 0.1 07/04/2023 0850   EOSABS 0.1 12/28/2014 0444   BASOSABS 0.0 09/19/2023 2216   BASOSABS 0.0 07/04/2023 0850   BASOSABS 0.1 12/28/2014 0444    Imaging  CTA chest 05/15/2024 1. Redemonstration of large clot in the main right pulmonary artery, essentially similar to the prior study. There is extension of the clot into the right upper lobe lobar pulmonary artery, that is also unchanged. However, there is interval increase in the peripheral nonocclusive thrombus in the left lung lower lobe lobar pulmonary artery when compared to the prior exam. 2. Redemonstration of compressed left ventricle, compatible with right heart failure/strain. 3. Dilation of the main pulmonary trunk measuring up to 3.7 cm, which is nonspecific but can be seen with pulmonary artery hypertension. 4. Multiple other nonacute observations, as described above.   Echocardiogram 05/17/2023  1. Left ventricular ejection fraction, by estimation, is 60 to 65%. The  left ventricle has normal function. The left ventricle has no regional  wall motion abnormalities.  There is mild asymmetric left ventricular  hypertrophy of the basal-septal segment.  Left ventricular diastolic parameters were normal. There is the  interventricular septum is flattened in systole, consistent with right  ventricular pressure overload.   2. Right ventricular systolic function is moderately reduced. The right  ventricular size is moderately enlarged. Tricuspid regurgitation signal is  inadequate for assessing PA pressure.   3. Right atrial size was moderately dilated.   4. The mitral valve is normal in structure. No evidence of mitral valve  regurgitation. No evidence of mitral stenosis.   5. The aortic valve is calcified. Aortic valve regurgitation is trivial.  Aortic valve sclerosis/calcification is present, without any evidence of  aortic stenosis.   Perfusion Scan 02/2023 High probability of pulmonary embolism bilaterally. The acuity of this finding is indeterminate given bilateral pulmonary emboli  on previous CT angiogram from 12/13/2022. A follow-up CTA could be performed to assess for progression and to assess for the presence of right heart strain.  RHC 08/2023  Right heart cath On 09/19/2018 RA 16, RV 103/9, PA 103/26 mean PA 55, wedge pressure of 13.  PVR 10.4 Woods unit.  Cardiac output 3.16 L/min cardiac index 1.24 L/min.       No data to display           Assessment & Plan:  Ryan [name] is a 53 year old male patient with a past medical history of recurrent DVT/PE presenting to the pulmonary clinic today to establish care.   #History of multiple DVTs s/p IVC filter placement (Remains in place for > 10 years)  #History of recurrent PE most recent on 09/16 has been on Xarelto up until April and was switched to Coumadin back then. Has been therapeutic and despite that he had a recurrent PE. Hypercoagulable work up has been negative thus far.  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   []  PFTs []  C/w Coumadin for a goal INR 2 to 3. Managed by PCP/Hematology []  On milrinone drip, adempas and volume management by Cardiology.  []  Schedule for an Angiogram in April.  []  Will discuss Referral to [facility] CTEPH clinic for further evaluation and management.   #High Risk OSA  #OHS  STOP BANG 8 high risk for sleep apnea. With snoring, fatigue during the day, increased neck circumference and obesity.   []  Split night.   Return in about 3 months (around 02/16/2024).  I spent 35 minutes caring for this patient today, including preparing to see the patient, obtaining a medical history , reviewing a separately obtained history, performing a medically appropriate examination and/or evaluation, counseling and educating the patient/family/caregiver, ordering medications, tests, or procedures, referring and communicating with other health care professionals (not separately reported), documenting clinical information in the electronic health record, and independently interpreting results (not separately reported/billed) and communicating results to the patient/family/caregiver  Janann Colonel, MD [facility] 11/17/2023 9:16 AM

## 2023-11-17 NOTE — Addendum Note (Signed)
 Addended by: Janann Colonel on: 11/17/2023 10:36 AM   Modules accepted: Orders

## 2023-11-22 ENCOUNTER — Ambulatory Visit: Payer: Self-pay

## 2023-11-22 NOTE — Patient Instructions (Signed)
 Visit Information  Thank you for taking time to visit with me today. Please don't hesitate to contact me if I can be of assistance to you.   Following are the goals we discussed today:   Goals Addressed             This Visit's Progress    Patient will work with CCM team for education and management of health conditions.       Interventions Today    Flowsheet Row Most Recent Value  Chronic Disease   Chronic disease during today's visit Congestive Heart Failure (CHF), Other  [OSA, FALL/ right knee injury, CTEPH]  General Interventions   General Interventions Discussed/Reviewed General Interventions Discussed, Doctor Visits, Durable Medical Equipment (DME)  [evaluation of current treatment plan for listed health conditions and patients adherence to plan as established by provider. Assessed for HF symptoms/ pain level/ any new or ongoing symptoms.]  Doctor Visits Discussed/Reviewed Doctor Visits Discussed  [reviewed upcoming provider visits. Confirmed patient has transportation to appointments.]  Durable Medical Equipment (DME) Oxygen, Other  [Assessed use of oxygen and oxygen dosage. Advised ongoing monitor of oxygen level reporting frequent oxygen levels below 90. Advised continued use of crutches as recommended. Called to [employer] to determine if patient eligible for POC.]  Exercise Interventions   Exercise Discussed/Reviewed Physical Activity  [Assessed current physical activity. Confirmed patient has support system if and as needed for care.]  Education Interventions   Education Provided Provided Education, Provided Printed Education  [Discussed HF symptoms and provider instructed HF action plan. Confirmed aware of HF action plan using teach back method. Advised to notify provider for worsening symptoms and/or no symptom response from action plan. Notify provider for bleeding signs.]  Provided Verbal Education On When to see the doctor  [Advised to monitor BP daily and record notify  provider for BP readings outside of established parameters. Reviewed signs of infection related to PICC line. Advised to notify Home health nurse and/ or provider for infection like symptoms.]  Nutrition Interventions   Nutrition Discussed/Reviewed Nutrition Discussed, Decreasing salt  [Education article on low salt diet sent to patient in MyChart]  Pharmacy Interventions   Pharmacy Dicussed/Reviewed Pharmacy Topics Discussed  [Medications reviewed. Discussed and advised adherence to medication. Discussed home health nursing services for milrinone drip.]  Safety Interventions   Safety Discussed/Reviewed Safety Discussed, Fall Risk  [Assessed for fall and fall injury. Fall prevention/ safety discussed. Encouraged patient to make sure oxygen / PICC line cord not in walkway areas. Advised to remove any throw rugs or clutter from walkway.]  Advanced Directive Interventions   Advanced Directives Discussed/Reviewed Advanced Directives Discussed  [Offered to mail advanced directive to patient.]              Our next appointment is by telephone on 12/07/23 at 10 am  Please call the care guide team at [phone] if you need to cancel or reschedule your appointment.   If you are experiencing a Mental Health or Behavioral Health Crisis or need someone to talk to, please call the Suicide and Crisis Lifeline: 988 call 1-800-273-TALK (toll free, 24 hour hotline)  Patient verbalizes understanding of instructions and care plan provided today and agrees to view in MyChart. Active MyChart status and patient understanding of how to access instructions and care plan via MyChart confirmed with patient.     George Ina RN, BSN, CCM [place]  [facility], Population Health Case Manager Phone: [phone]  Low-Sodium Eating Plan Salt (sodium) helps you  keep a healthy balance of fluids in your body. Too much sodium can raise your blood pressure. It can also cause fluid and waste to be  held in your body. Your health care provider or dietitian may recommend a low-sodium eating plan if you have high blood pressure (hypertension), kidney disease, liver disease, or heart failure. Eating less sodium can help lower your blood pressure and reduce swelling. It can also protect your heart, liver, and kidneys. What are tips for following this plan? Reading food labels  Check food labels for the amount of sodium per serving. If you eat more than one serving, you must multiply the listed amount by the number of servings. Choose foods with less than 140 milligrams (mg) of sodium per serving. Avoid foods with 300 mg of sodium or more per serving. Always check how much sodium is in a product, even if the label says "unsalted" or "no salt added." Shopping  Buy products labeled as "low-sodium" or "no salt added." Buy fresh foods. Avoid canned foods and pre-made or frozen meals. Avoid canned, cured, or processed meats. Buy breads that have less than 80 mg of sodium per slice. Cooking  Eat more home-cooked food. Try to eat less restaurant, buffet, and fast food. Try not to add salt when you cook. Use salt-free seasonings or herbs instead of table salt or sea salt. Check with your provider or pharmacist before using salt substitutes. Cook with plant-based oils, such as canola, sunflower, or olive oil. Meal planning When eating at a restaurant, ask if your food can be made with less salt or no salt. Avoid dishes labeled as brined, pickled, cured, or smoked. Avoid dishes made with soy sauce, miso, or teriyaki sauce. Avoid foods that have monosodium glutamate (MSG) in them. MSG may be added to some restaurant food, sauces, soups, bouillon, and canned foods. Make meals that can be grilled, baked, poached, roasted, or steamed. These are often made with less sodium. General information Try to limit your sodium intake to 1,500-2,300 mg each day, or the amount told by your provider. What foods  should I eat? Fruits Fresh, frozen, or canned fruit. Fruit juice. Vegetables Fresh or frozen vegetables. "No salt added" canned vegetables. "No salt added" tomato sauce and paste. Low-sodium or reduced-sodium tomato and vegetable juice. Grains Low-sodium cereals, such as oats, puffed wheat and rice, and shredded wheat. Low-sodium crackers. Unsalted rice. Unsalted pasta. Low-sodium bread. Whole grain breads and whole grain pasta. Meats and other proteins Fresh or frozen meat, poultry, seafood, and fish. These should have no added salt. Low-sodium canned tuna and salmon. Unsalted nuts. Dried peas, beans, and lentils without added salt. Unsalted canned beans. Eggs. Unsalted nut butters. Dairy Milk. Soy milk. Cheese that is naturally low in sodium, such as ricotta cheese, fresh mozzarella, or Swiss cheese. Low-sodium or reduced-sodium cheese. Cream cheese. Yogurt. Seasonings and condiments Fresh and dried herbs and spices. Salt-free seasonings. Low-sodium mustard and ketchup. Sodium-free salad dressing. Sodium-free light mayonnaise. Fresh or refrigerated horseradish. Lemon juice. Vinegar. Other foods Homemade, reduced-sodium, or low-sodium soups. Unsalted popcorn and pretzels. Low-salt or salt-free chips. The items listed above may not be all the foods and drinks you can have. Talk to a dietitian to learn more. What foods should I avoid? Vegetables Sauerkraut, pickled vegetables, and relishes. Olives. [place] fries. Onion rings. Regular canned vegetables, except low-sodium or reduced-sodium items. Regular canned tomato sauce and paste. Regular tomato and vegetable juice. Frozen vegetables in sauces. Grains Instant hot cereals. Bread stuffing, pancake, and  biscuit mixes. Croutons. Seasoned rice or pasta mixes. Noodle soup cups. Boxed or frozen macaroni and cheese. Regular salted crackers. Self-rising flour. Meats and other proteins Meat or fish that is salted, canned, smoked, spiced, or pickled.  Precooked or cured meat, such as sausages or meat loaves. Tomasa Blase. Ham. Pepperoni. Hot dogs. Corned beef. Chipped beef. Salt pork. Jerky. Pickled herring, anchovies, and sardines. Regular canned tuna. Salted nuts. Dairy Processed cheese and cheese spreads. Hard cheeses. Cheese curds. Blue cheese. Feta cheese. String cheese. Regular cottage cheese. Buttermilk. Canned milk. Fats and oils Salted butter. Regular margarine. Ghee. Bacon fat. Seasonings and condiments Onion salt, garlic salt, seasoned salt, table salt, and sea salt. Canned and packaged gravies. Worcestershire sauce. Tartar sauce. Barbecue sauce. Teriyaki sauce. Soy sauce, including reduced-sodium soy sauce. Steak sauce. Fish sauce. Oyster sauce. Cocktail sauce. Horseradish that you find on the shelf. Regular ketchup and mustard. Meat flavorings and tenderizers. Bouillon cubes. Hot sauce. Pre-made or packaged marinades. Pre-made or packaged taco seasonings. Relishes. Regular salad dressings. Salsa. Other foods Salted popcorn and pretzels. Corn chips and puffs. Potato and tortilla chips. Canned or dried soups. Pizza. Frozen entrees and pot pies. The items listed above may not be all the foods and drinks you should avoid. Talk to a dietitian to learn more. This information is not intended to replace advice given to you by your health care provider. Make sure you discuss any questions you have with your health care provider. Document Revised: 09/02/2022 Document Reviewed: 09/02/2022 Elsevier Patient Education  2024 [employer].

## 2023-11-22 NOTE — Patient Outreach (Signed)
 Care Coordination   Initial Visit Note   11/22/2023 Name: Ryan [name] MRN: [id number] DOB: Dec 29, 1970  Ryan [name] is a 53 y.o. year old male who sees Darrick Huntsman, Mar Daring, MD for primary care. I spoke with  Ryan [name] by phone today.  What matters to the patients health and wellness today?  Referral received from [facility] nurse. Patient states he is feeling a little better.  Patient reports having follow up visit with pulmonologist on 11/16/23. Patient states he is not on CPAP. He states he was unable to have his sleep study done due to being hospitalized in January 2025.  He states sleep study with have to be rescheduled.  Patient reports having follow up visit with cardiologist on 11/14/23. He states he continues on his milrinone treatment, Riociguat 3 x daily and torsemide. Reports feeling better.   Patient states home health nurse comes out 1 x a week to monitor/ dress PICC line and manage milrinone drip.  Patient states he is scheduled for a heart cath on 12/16/23. Patient states he has lower extremity swelling which is baseline for him.  He reports SOB with exertion which is also baseline.  Denies any increase in these symptoms at this time. He states he wears his Oxygen as needed and reports oxygen saturation ranging from 90-95 with occasional drop under 90 with exertion.  He states his oxygen is from [facility].   Patient states he fell approximately 2 weeks ago tripping over his PICC line and bruised his right knee joint.   He states he is using crutches for ambulation at this time however knee pain is getting better.  Patient states he has occasional lightheadedness when walking with crutches from his house to the car due to over exertion.  He states he is usually not wearing his oxygen at that time because it is difficult for him to transport the portable oxygen tank and use crutches.  Patient states he is monitoring his blood pressure daily and recording. Reports BP readings of 118/70, 115/69, 119/70.   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  He states this could be making his circulation problem worse.  Telephone call to [employer]. Spoke with Robby Sermon to request portable oxygen concentrator for patient.  Robby Sermon states patient does not qualify for POC due to his usage. She states patient has not ordered oxygen tank since last year.  Ky offered to have patient delivered a shoulder bag for portable tank patient currently has.  Agreed with shoulder bag to be delivered to patient.  Contact patient to inform him he does not qualify for POC.  Notified that [employer] would deliver shoulder bag for his current portable tank and he can use or not at his discretion.    Goals Addressed             This Visit's Progress    Patient will work with CCM team for education and management of health conditions.       Interventions Today    Flowsheet Row Most Recent Value  Chronic Disease   Chronic disease during today's visit Congestive Heart Failure (CHF), Other  [OSA, FALL/ right knee injury, CTEPH]  General Interventions   General Interventions Discussed/Reviewed General Interventions Discussed, Doctor Visits, Durable Medical Equipment (DME)  [evaluation of current treatment plan for listed health conditions and patients adherence to plan as established by provider. Assessed for HF symptoms/ pain level/ any new or ongoing symptoms.]  Doctor Visits Discussed/Reviewed Doctor Visits Discussed  [  reviewed upcoming provider visits. Confirmed patient has transportation to appointments.]  Durable Medical Equipment (DME) Oxygen, Other  [Assessed use of oxygen and oxygen dosage. Advised ongoing monitor of oxygen level reporting frequent oxygen levels below 90. Advised continued use of crutches as recommended. Called to [employer] to determine if patient eligible for POC.]  Exercise Interventions   Exercise Discussed/Reviewed Physical Activity  [Assessed current physical activity. Confirmed patient has  support system if and as needed for care.]  Education Interventions   Education Provided Provided Education, Provided Printed Education  [Discussed HF symptoms and provider instructed HF action plan. Confirmed aware of HF action plan using teach back method. Advised to notify provider for worsening symptoms and/or no symptom response from action plan. Notify provider for bleeding signs.]  Provided Verbal Education On When to see the doctor  [Advised to monitor BP daily and record notify provider for BP readings outside of established parameters. Reviewed signs of infection related to PICC line. Advised to notify Home health nurse and/ or provider for infection like symptoms.]  Nutrition Interventions   Nutrition Discussed/Reviewed Nutrition Discussed, Decreasing salt  [Education article on low salt diet sent to patient in MyChart]  Pharmacy Interventions   Pharmacy Dicussed/Reviewed Pharmacy Topics Discussed  [Medications reviewed. Discussed and advised adherence to medication. Discussed home health nursing services for milrinone drip.]  Safety Interventions   Safety Discussed/Reviewed Safety Discussed, Fall Risk  [Assessed for fall and fall injury. Fall prevention/ safety discussed. Encouraged patient to make sure oxygen / PICC line cord not in walkway areas. Advised to remove any throw rugs or clutter from walkway.]  Advanced Directive Interventions   Advanced Directives Discussed/Reviewed Advanced Directives Discussed  [Offered to mail advanced directive to patient.]              SDOH assessments and interventions completed:  Yes  SDOH Interventions Today    Flowsheet Row Most Recent Value  SDOH Interventions   Food Insecurity Interventions Intervention Not Indicated  Housing Interventions Intervention Not Indicated  Transportation Interventions Intervention Not Indicated  Utilities Interventions Intervention Not Indicated        Care Coordination Interventions:  Yes, provided    Follow up plan: Follow up call scheduled for 12/07/23 at 10 am    Encounter Outcome:  Patient Visit Completed  George Ina RN, BSN, CCM [place]  [facility], Population Health Case Manager Phone: [phone]

## 2023-11-23 ENCOUNTER — Telehealth: Payer: Self-pay | Admitting: Pharmacist

## 2023-11-23 MED ORDER — ADEMPAS 2.5 MG PO TABS: 2.5000 mg | ORAL_TABLET | Freq: Three times a day (TID) | ORAL | Status: DC

## 2023-11-23 NOTE — Telephone Encounter (Signed)
 Received call from [facility]. Permission given to titrate Adempas to 2.5 mg dose.

## 2023-11-29 ENCOUNTER — Telehealth: Payer: Self-pay

## 2023-11-29 NOTE — Telephone Encounter (Signed)
-----   Message from Sherlene Shams sent at 11/29/2023  8:25 AM EDT ----- Regarding: disability and leave provider statement I received a packet for disability and leave for patient with no discussion or recent visit from patient.  I do not complete these without an office or virtual visit with patient because there is information I do not have. Ryan [name] His next appt with me is April 16 .  If he needs it completed before that he will need to schedule a visit sooner

## 2023-11-29 NOTE — Telephone Encounter (Signed)
 I have sent pt mychart message letting him know of this.

## 2023-12-05 ENCOUNTER — Encounter: Payer: Self-pay | Admitting: Internal Medicine

## 2023-12-05 ENCOUNTER — Ambulatory Visit (INDEPENDENT_AMBULATORY_CARE_PROVIDER_SITE_OTHER): Admitting: Internal Medicine

## 2023-12-05 VITALS — BP 128/80 | HR 65 | Ht 67.0 in | Wt 374.8 lb

## 2023-12-05 DIAGNOSIS — J9611 Chronic respiratory failure with hypoxia: Secondary | ICD-10-CM

## 2023-12-05 DIAGNOSIS — E1122 Type 2 diabetes mellitus with diabetic chronic kidney disease: Secondary | ICD-10-CM | POA: Diagnosis not present

## 2023-12-05 DIAGNOSIS — I480 Paroxysmal atrial fibrillation: Secondary | ICD-10-CM | POA: Diagnosis not present

## 2023-12-05 DIAGNOSIS — Z7901 Long term (current) use of anticoagulants: Secondary | ICD-10-CM

## 2023-12-05 DIAGNOSIS — Z7985 Long-term (current) use of injectable non-insulin antidiabetic drugs: Secondary | ICD-10-CM

## 2023-12-05 DIAGNOSIS — R791 Abnormal coagulation profile: Secondary | ICD-10-CM

## 2023-12-05 DIAGNOSIS — I82519 Chronic embolism and thrombosis of unspecified femoral vein: Secondary | ICD-10-CM

## 2023-12-05 DIAGNOSIS — I2724 Chronic thromboembolic pulmonary hypertension: Secondary | ICD-10-CM

## 2023-12-05 DIAGNOSIS — I50810 Right heart failure, unspecified: Secondary | ICD-10-CM

## 2023-12-05 DIAGNOSIS — Z5181 Encounter for therapeutic drug level monitoring: Secondary | ICD-10-CM | POA: Diagnosis not present

## 2023-12-05 DIAGNOSIS — Z9981 Dependence on supplemental oxygen: Secondary | ICD-10-CM

## 2023-12-05 DIAGNOSIS — G4731 Primary central sleep apnea: Secondary | ICD-10-CM

## 2023-12-05 LAB — PROTIME-INR
INR: 3.2 ratio — ABNORMAL HIGH (ref 0.8–1.0)
Prothrombin Time: 31.7 s — ABNORMAL HIGH (ref 9.6–13.1)

## 2023-12-05 LAB — HEMOGLOBIN A1C: Hgb A1c MFr Bld: 6.5 % (ref 4.6–6.5)

## 2023-12-05 MED ORDER — TIRZEPATIDE 7.5 MG/0.5ML ~~LOC~~ SOAJ: 7.5000 mg | SUBCUTANEOUS | 0 refills | Status: DC

## 2023-12-05 NOTE — H&P (View-Only) (Signed)
 Subjective:  Patient ID: Ryan [name], male    DOB: September 17, 1970  Age: 53 y.o. MRN: [id number]  CC: The primary encounter diagnosis was Type 2 diabetes mellitus with diabetic chronic kidney disease, unspecified CKD stage, unspecified whether long term insulin  use (HCC). 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   HPI Ryan [name] presents for  Chief Complaint  Patient presents with   Medical Management of Chronic Issues    Discuss need for FMLA     Ryan [name] is a 53 yr old male with a complicated medical history which includes chronic respiratory failure,   diastolic heart failure.  Recurrent DVT/PE despite anticoagulation morbid obesity, PAF,  type 2 DM with CKD stage 3 who presents for diabetes follow up and request for FMLA   1) Chronic respiratory failure:  multifactorial reviewed his most recent hospitalization:  he was admitted to [facility] in January 2025 with decompensated RV failure.  Right heart cath On 09/20/2023 : RA 16, RV 103/9, PA 103/26 mean PA 55, wedge pressure of 13.  PVR 10.4 Woods unit.  Cardiac output 3.16 L/min cardiac index 1.24 L/min.    He is currently maintained on milrinone  drip at 0.25 mcg/kg/min.  Started on riociguat  1 mg 3 times daily and torsemide  40 mg daily.   Patient has not been using supplemental oxygen as directed.  Does not use during the day,  and only uses at night if his supine readings are low.  He is a side sleeper and during a previous sleep study became orthopneic and hypoxic while supine,  resulting in admission.  He has frequent ER visits and after last  admission was advised to use supplemental oxygen 24/7 but does not have a portable oxygen tank.  He was noted in ER to be quite hypoxic despite a clear chest x ray .  Ambulatory sats in the office today confirm that he is oxygen dependent with ambulation. He is unable to resume work , even from home,  due to chronic fatigue and dyspnea related to his cardiopulmonary condition   2) Recurrent DVT/PE :  most recent PE occurred while therapeutic on coumadin .  INR has been monitored with MD INR ; hower he has not had an INR since mid March despite use of home device.  Current regimen is 5 mg daily.  6 mg on thurs and Sat    3) type 2 DM:  using Mounjaro .  Did not tolerate SGLT 2 Inhibitor.  .   Outpatient Medications Prior to Visit  Medication Sig Dispense Refill   albuterol  (VENTOLIN  HFA) 108 (90 Base) MCG/ACT inhaler Inhale 2 puffs into the lungs every 6 (six) hours as needed for wheezing or shortness of breath. 8 g 2   milrinone  (PRIMACOR ) 20 MG/100 ML SOLN infusion Inject 0.0413 mg/min into the vein continuous.     Multiple Vitamin (MULTIVITAMIN WITH MINERALS) TABS tablet Take 1 tablet by mouth daily. 30 tablet 0   pantoprazole  (PROTONIX ) 40 MG tablet Take 1 tablet (40 mg total) by mouth daily. On an empty stomach 30 minutes prior to eating 30 tablet 3   Riociguat  (ADEMPAS ) 2.5 MG TABS Take 2.5 mg by mouth 3 (three) times daily.  torsemide  (DEMADEX ) 20 MG tablet Take 80 mg (4 tablets) by mouth daily for 3 days, then resume 40 mg (2 tablets) by mouth daily 30 tablet 3   warfarin (COUMADIN ) 1 MG tablet Take with 5 mg on Thursdays and Sundays 30 tablet 0   warfarin (COUMADIN ) 5 MG tablet Take 1 tablet (5 mg total) by mouth daily. 90 tablet 1   tirzepatide  (MOUNJARO ) 7.5 MG/0.5ML Pen Inject 7.5 mg into the skin once a week. 2 mL 0   No facility-administered medications prior to visit.    Review of Systems;  Patient denies headache, fevers, malaise, unintentional weight loss, skin rash, eye pain,  sinus congestion and sinus pain, sore throat, dysphagia,  hemoptysis , cough,  wheezing, chest pain, palpitations, orthopnea, edema, abdominal pain, nausea, melena, diarrhea, constipation, flank pain, dysuria, hematuria, urinary  Frequency, nocturia, numbness, tingling, seizures,  Focal weakness, Loss of consciousness,  Tremor, insomnia, depression, anxiety, and suicidal ideation.      Objective:  BP 128/80   Pulse 65   Ht 5\' 7"  (1.702 m)   Wt (!) 374 lb 12.8 oz (170 kg)   SpO2 90%   BMI 58.70 kg/m   BP Readings from Last 3 Encounters:  12/05/23 128/80  11/16/23 126/86  11/14/23 125/74    Wt Readings from Last 3 Encounters:  12/05/23 (!) 374 lb 12.8 oz (170 kg)  11/16/23 (!) 359 lb 6.4 oz (163 kg)  11/14/23 (!) 357 lb 3 oz (162 kg)    Physical Exam Vitals reviewed.  Constitutional:      General: He is not in acute distress.    Appearance: Normal appearance. He is obese. He is not ill-appearing, toxic-appearing or diaphoretic.  HENT:     Head: Normocephalic.  Eyes:     General: No scleral icterus.       Right eye: No discharge.        Left eye: No discharge.     Conjunctiva/sclera: Conjunctivae normal.  Cardiovascular:     Rate and Rhythm: Normal rate and regular rhythm.     Heart sounds: Normal heart sounds.  Pulmonary:     Effort: Pulmonary effort is normal. No respiratory distress.     Breath sounds: Normal breath sounds.  Musculoskeletal:        General: Normal range of motion.     Cervical back: Normal range of motion.  Skin:    General: Skin is warm and dry.  Neurological:     General: No focal deficit present.     Mental Status: He is alert and oriented to person, place, and time. Mental status is at baseline.  Psychiatric:        Mood and Affect: Mood normal.        Behavior: Behavior normal.        Thought Content: Thought content normal.        Judgment: Judgment normal.    Lab Results  Component Value Date   HGBA1C 6.5 12/05/2023   HGBA1C 6.1 (H)  06/14/2023   HGBA1C 6.6 (H) 10/15/2022    Lab Results  Component Value Date   CREATININE 1.43 (H) 10/24/2023   CREATININE 1.68 (H) 10/16/2023   CREATININE 1.32 (H) 09/30/2023    Lab Results  Component Value Date   WBC 4.1 10/24/2023   HGB 13.6 10/24/2023   HCT 41.7 10/24/2023   PLT 333 10/24/2023   GLUCOSE 99 10/24/2023   CHOL 168 10/15/2022   TRIG 67 10/15/2022   HDL 41 10/15/2022  LDLCALC 114 (H) 10/15/2022   ALT 23 10/16/2023   AST 25 10/16/2023   NA 143 10/24/2023   K 4.6 10/24/2023   CL 105 10/24/2023   CREATININE 1.43 (H) 10/24/2023   BUN 12 10/24/2023   CO2 23 10/24/2023   TSH 3.471 08/19/2023   PSA 0.8 12/25/2014   INR 3.2 (H) 12/05/2023   HGBA1C 6.5 12/05/2023   MICROALBUR 178.9 (H) 08/18/2023    CT Angio Chest PE W and/or Wo Contrast Result Date: 10/16/2023 CLINICAL DATA:  High probability pulmonary embolism EXAM: CT ANGIOGRAPHY CHEST WITH CONTRAST TECHNIQUE: Multidetector CT imaging of the chest was performed using the standard protocol during bolus administration of intravenous contrast. Multiplanar CT image reconstructions and MIPs were obtained to evaluate the vascular anatomy. RADIATION DOSE REDUCTION: This exam was performed according to the departmental dose-optimization program which includes automated exposure control, adjustment of the mA and/or kV according to patient size and/or use of iterative reconstruction technique. CONTRAST:  OMNIPAQUE  IOHEXOL  350 MG/ML SOLN COMPARISON:  None Available. FINDINGS: Cardiovascular: There is stable adherent soft tissue within the right mainstem bronchus, synechia within the lower lobar pulmonary arteries bilaterally, and scattered areas eccentric thrombus within the segmental subsegmental pulmonary arteries in keeping with chronic pulmonary embolism. No superimposed acute pulmonary emboli identified. Central pulmonary arteries are enlarged in keeping with changes of pulmonary arterial hypertension unchanged.  Elevated right heart pressure noted with dilation of the right ventricle, stable since prior examination. Reflux of contrast into the hepatic venous system in keeping with at least some degree of right heart failure. No significant coronary artery calcification. Global cardiac size within limits. Thoracic aorta is unremarkable. Central venous catheter tip noted within the superior vena cava. Mediastinum/Nodes: No enlarged mediastinal, hilar, or axillary lymph nodes. Thyroid  gland, trachea, and esophagus demonstrate no significant findings. Lungs/Pleura: Mosaic attenuation of the parenchyma likely relates to altered vascular perfusion the setting of pulmonary embolism. No confluent pulmonary infiltrate. No pneumothorax or pleural effusion. Upper Abdomen: Inferior vena cava filter noted within the suprarenal cava. 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 No acute abnormality. Musculoskeletal: Numerous anterior chest wall venous collaterals are identified. No acute bone abnormality. No lytic or blastic lesion. Review of the MIP images confirms the above findings. IMPRESSION: 1. Stable adherent soft tissue within the right mainstem bronchus, synechia within the lower lobar pulmonary arteries bilaterally, and scattered areas eccentric thrombus within the segmental subsegmental pulmonary arteries in keeping with chronic pulmonary embolism. No superimposed acute pulmonary emboli identified. 2. Stable enlargement of the central pulmonary arteries in keeping with changes of pulmonary arterial hypertension. 3. Stable elevated right heart pressure with dilation of the right ventricle and reflux of contrast into the hepatic venous system in keeping with at least some degree of right heart failure. 4. Mosaic attenuation of the parenchyma likely relates to altered vascular perfusion the setting of pulmonary embolism. Electronically Signed   By: Worthy Heads M.D.   On: 10/16/2023 20:25   CT Knee Right Wo  Contrast Result Date: 10/16/2023 CLINICAL DATA:  Knee trauma, occult fracture suspected, xray done EXAM: CT OF THE RIGHT KNEE WITHOUT CONTRAST TECHNIQUE: Multidetector CT imaging of the right knee was performed according to the standard protocol. Multiplanar CT image reconstructions were also generated. RADIATION DOSE REDUCTION: This exam was performed according to the departmental dose-optimization program which includes automated exposure control, adjustment of the mA and/or kV according to patient size and/or use of iterative reconstruction technique. COMPARISON:  Radiograph earlier today FINDINGS: Bones/Joint/Cartilage  No acute fracture. No dislocation. Tricompartmental peripheral spurring. Medial tibiofemoral joint space narrowing. Mild spurring of the tibial spines. No erosion or focal bone abnormality. Small joint effusion. Ligaments Suboptimally assessed by CT.  No gross ligamentous disruption. Muscles and Tendons Mild fatty atrophy.  No intramuscular collection. Soft tissues Subcutaneous varicosities. Generalized subcutaneous edema, which is confluent in the included calf. Scattered soft tissue calcifications which have a chronic appearance. No discrete fluid collection. IMPRESSION: 1. No acute fracture or dislocation of the right knee. 2. 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 3. Small joint effusion. 4. Generalized subcutaneous edema, confluent in the included calf. Electronically Signed   By: Chadwick Colonel M.D.   On: 10/16/2023 20:05   DG Knee Complete 4 Views Right Result Date: 10/16/2023 CLINICAL DATA:  Trip and fall on right knee.  Knee pain. EXAM: RIGHT KNEE - COMPLETE 4+ VIEW COMPARISON:  None Available. FINDINGS: No acute fracture or dislocation. Mild tricompartmental osteoarthritis with medial tibiofemoral joint space narrowing and tricompartmental peripheral spurring. No significant knee joint effusion. Generalized soft tissue edema. No erosive  change. IMPRESSION: 1. No acute fracture or dislocation. 2. Mild tricompartmental osteoarthritis. Electronically Signed   By: Chadwick Colonel M.D.   On: 10/16/2023 14:38    Assessment & Plan:  .Type 2 diabetes mellitus with diabetic chronic kidney disease, unspecified CKD stage, unspecified whether long term insulin  use (HCC) Assessment & Plan: Managed with Mounjaro .  He did not tolerate Jardiance ,  and Farxiga  is not covered   Lab Results  Component Value Date   HGBA1C 6.5 12/05/2023     Orders: -     Hemoglobin A1c  Subtherapeutic international normalized ratio (INR) -     Protime-INR  Anticoagulation goal of INR 2.5 to 3.5 Assessment & Plan: INR is 3.2 , therapeutic on regimen of  6 mg on Thursdays and Sundays,  5 mg all other days . Repeat inr one week   Lab Results  Component Value Date   INR 3.2 (H) 12/05/2023   INR 3.40 (A) 10/26/2023   INR 2.5 (H) 10/16/2023      Paroxysmal atrial fibrillation (HCC) Assessment & Plan: Counselled patient that his AF may be triggered by hypoxia and strongly advised to wear supplemental oxygen 24/7   Central sleep apnea Assessment & Plan: Diagnosed during 15 minutes of sleep during November 2024  in lab sleep study with AHI of 115 and desats to 85% . Sleep study was aborted due to patient's report of orthopnea and he was transferred to ER.  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  Continue current regimen  of warfarin 5 mg daily  6 mg twice weekly .  He has an IVC filter  Lab Results  Component Value Date   INR 3.2 (H) 12/05/2023   INR 3.40 (A) 10/26/2023   INR 2.5 (H) 10/16/2023      Right ventricular failure (HCC) Assessment & Plan: Continue torsemide  at 20 mg dose and digoxin ,  Strongly urrged  to follow up with his pulmonologist to reschedule his sleep study vs CPAP titration study to start managing his nocturnal hypoxia.  Right heart catheterization on Jan 21 was done ,  repeat planned in mid April    CTEPH (chronic thromboembolic pulmonary hypertension) (HCC) Assessment &  Plan: With chronic hypoxic respiratory failure likely aggravated by morbid obesity ad restrictive lung disease. Ambulatory sats today on room air fell to 85% and corrected to 89% with 2 L oxygen .  Portable oxygen tank needed   Orders: -     For home use only DME oxygen  Chronic hypoxic respiratory failure, on home oxygen therapy (HCC) Assessment & Plan: PORTABLE OXYGEN TANK NEEDED.  DME ORDER WRITTEN  Orders: -     For home use only DME oxygen  Other orders -     Tirzepatide ; Inject 7.5 mg into the skin once a week.  Dispense: 2 mL; Refill: 0     In addition to time spent completing his FMLA form  I spent 34 minutes on the day of this face to face encounter reviewing patient's  most recent visit with cardiology,  pulmonology,  recent hospitalization,   prior relevant surgical and non surgical procedures, recent  labs and imaging studies, counseling on weight management,  reviewing the assessment and plan with patient, and post visit ordering and reviewing of  diagnostics and therapeutics with patient  .   Follow-up: Return in about 2 weeks (around 12/19/2023) for follow up diabetes.   Thersia Flax, MD

## 2023-12-05 NOTE — Patient Instructions (Addendum)
 You should be using your oxygen 100% of the time,  24/7 .  You qualified today for portable oxygen.   Your low oxygen levels during sleep could be  aggravating your heart condition!!

## 2023-12-05 NOTE — Progress Notes (Unsigned)
 Subjective:  Patient ID: Ryan [name], male    DOB: [birth year]  Age: 53 y.o. MRN: [id number]  CC: There were no encounter diagnoses.   HPI Ryan [name] presents for  Chief Complaint  Patient presents with   Medical Management of Chronic Issues    Discuss need for FMLA     1) Chronic respiratory failure: patient has not been using supplemental oxygen as directed.  Does not use during the day,  and only uses at night if his reading is low.     Outpatient Medications Prior to Visit  Medication Sig Dispense Refill   albuterol (VENTOLIN HFA) 108 (90 Base) MCG/ACT inhaler Inhale 2 puffs into the lungs every 6 (six) hours as needed for wheezing or shortness of breath. 8 g 2   milrinone (PRIMACOR) 20 MG/100 ML SOLN infusion Inject 0.0413 mg/min into the vein continuous.     Multiple Vitamin (MULTIVITAMIN WITH MINERALS) TABS tablet Take 1 tablet by mouth daily. 30 tablet 0   pantoprazole (PROTONIX) 40 MG tablet Take 1 tablet (40 mg total) by mouth daily. On an empty stomach 30 minutes prior to eating 30 tablet 3   Riociguat (ADEMPAS) 2.5 MG TABS Take 2.5 mg by mouth 3 (three) times daily.     tirzepatide (MOUNJARO) 7.5 MG/0.5ML Pen Inject 7.5 mg into the skin once a week. 2 mL 0   torsemide (DEMADEX) 20 MG tablet Take 80 mg (4 tablets) by mouth daily for 3 days, then resume 40 mg (2 tablets) by mouth daily 30 tablet 3   warfarin (COUMADIN) 1 MG tablet Take with 5 mg on Thursdays and Sundays 30 tablet 0   warfarin (COUMADIN) 5 MG tablet Take 1 tablet (5 mg total) by mouth daily. 90 tablet 1   No facility-administered medications prior to visit.    Review of Systems;  Patient denies headache, fevers, malaise, unintentional weight loss, skin rash, eye pain, sinus congestion and sinus pain, sore throat, dysphagia,  hemoptysis , cough, dyspnea, wheezing, chest pain, palpitations, orthopnea, edema, abdominal pain, nausea, melena, diarrhea, constipation, flank pain, dysuria, hematuria, urinary   Frequency, nocturia, numbness, tingling, seizures,  Focal weakness, Loss of consciousness,  Tremor, insomnia, depression, anxiety, and suicidal ideation.      Objective:  There were no vitals taken for this visit.  BP Readings from Last 3 Encounters:  11/16/23 126/86  11/14/23 125/74  10/24/23 108/84    Wt Readings from Last 3 Encounters:  11/16/23 (!) 359 lb 6.4 oz (163 kg)  11/14/23 (!) 357 lb 3 oz (162 kg)  10/24/23 (!) 361 lb (163.7 kg)    Physical Exam  Lab Results  Component Value Date   HGBA1C 6.1 (H) 06/14/2023   HGBA1C 6.6 (H) 10/15/2022   HGBA1C 6.4 (H) 07/15/2022    Lab Results  Component Value Date   CREATININE 1.43 (H) 10/24/2023   CREATININE 1.68 (H) 10/16/2023   CREATININE 1.32 (H) 09/30/2023    Lab Results  Component Value Date   WBC 4.1 10/24/2023   HGB 13.6 10/24/2023   HCT 41.7 10/24/2023   PLT 333 10/24/2023   GLUCOSE 99 10/24/2023   CHOL 168 10/15/2022   TRIG 67 10/15/2022   HDL 41 10/15/2022   LDLCALC 114 (H) 10/15/2022   ALT 23 10/16/2023   AST 25 10/16/2023   NA 143 10/24/2023   K 4.6 10/24/2023   CL 105 10/24/2023   CREATININE 1.43 (H) 10/24/2023   BUN 12 10/24/2023   CO2 23 10/24/2023  TSH 3.471 08/19/2023   PSA 0.8 12/25/2014   INR 3.40 (A) 10/26/2023   HGBA1C 6.1 (H) 06/14/2023   MICROALBUR 178.9 (H) 08/18/2023    CT Angio Chest PE W and/or Wo Contrast Result Date: 10/16/2023 CLINICAL DATA:  High probability pulmonary embolism EXAM: CT ANGIOGRAPHY CHEST WITH CONTRAST TECHNIQUE: Multidetector CT imaging of the chest was performed using the standard protocol during bolus administration of intravenous contrast. Multiplanar CT image reconstructions and MIPs were obtained to evaluate the vascular anatomy. RADIATION DOSE REDUCTION: This exam was performed according to the departmental dose-optimization program which includes automated exposure control, adjustment of the mA and/or kV according to patient size and/or use of iterative  reconstruction technique. CONTRAST:  OMNIPAQUE IOHEXOL 350 MG/ML SOLN COMPARISON:  None Available. FINDINGS: Cardiovascular: There is stable adherent soft tissue within the right mainstem bronchus, synechia within the lower lobar pulmonary arteries bilaterally, and scattered areas eccentric thrombus within the segmental subsegmental pulmonary arteries in keeping with chronic pulmonary embolism. No superimposed acute pulmonary emboli identified. Central pulmonary arteries are enlarged in keeping with changes of pulmonary arterial hypertension unchanged. Elevated right heart pressure noted with dilation of the right ventricle, stable since prior examination. Reflux of contrast into the hepatic venous system in keeping with at least some degree of right heart failure. No significant coronary artery calcification. Global cardiac size within limits. Thoracic aorta is unremarkable. Central venous catheter tip noted within the superior vena cava. Mediastinum/Nodes: No enlarged mediastinal, hilar, or axillary lymph nodes. Thyroid gland, trachea, and esophagus demonstrate no significant findings. Lungs/Pleura: Mosaic attenuation of the parenchyma likely relates to altered vascular perfusion the setting of pulmonary embolism. No confluent pulmonary infiltrate. No pneumothorax or pleural effusion. Upper Abdomen: Inferior vena cava filter noted within the suprarenal cava. 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 No acute abnormality. Musculoskeletal: Numerous anterior chest wall venous collaterals are identified. No acute bone abnormality. No lytic or blastic lesion. Review of the MIP images confirms the above findings. IMPRESSION: 1. Stable adherent soft tissue within the right mainstem bronchus, synechia within the lower lobar pulmonary arteries bilaterally, and scattered areas eccentric thrombus within the segmental subsegmental pulmonary arteries in keeping with chronic pulmonary embolism. No  superimposed acute pulmonary emboli identified. 2. Stable enlargement of the central pulmonary arteries in keeping with changes of pulmonary arterial hypertension. 3. Stable elevated right heart pressure with dilation of the right ventricle and reflux of contrast into the hepatic venous system in keeping with at least some degree of right heart failure. 4. Mosaic attenuation of the parenchyma likely relates to altered vascular perfusion the setting of pulmonary embolism. Electronically Signed   By: Helyn Numbers M.D.   On: 10/16/2023 20:25   CT Knee Right Wo Contrast Result Date: 10/16/2023 CLINICAL DATA:  Knee trauma, occult fracture suspected, xray done EXAM: CT OF THE RIGHT KNEE WITHOUT CONTRAST TECHNIQUE: Multidetector CT imaging of the right knee was performed according to the standard protocol. Multiplanar CT image reconstructions were also generated. RADIATION DOSE REDUCTION: This exam was performed according to the departmental dose-optimization program which includes automated exposure control, adjustment of the mA and/or kV according to patient size and/or use of iterative reconstruction technique. COMPARISON:  Radiograph earlier today FINDINGS: Bones/Joint/Cartilage No acute fracture. No dislocation. Tricompartmental peripheral spurring. Medial tibiofemoral joint space narrowing. Mild spurring of the tibial spines. No erosion or focal bone abnormality. Small joint effusion. Ligaments Suboptimally assessed by CT.  No gross ligamentous disruption. Muscles and Tendons Mild fatty atrophy.  No  intramuscular collection. Soft tissues Subcutaneous varicosities. Generalized subcutaneous edema, which is confluent in the included calf. Scattered soft tissue calcifications which have a chronic appearance. No discrete fluid collection. IMPRESSION: 1. No acute fracture or dislocation of the right knee. 2. 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 3. Small joint effusion. 4.  Generalized subcutaneous edema, confluent in the included calf. Electronically Signed   By: Narda Rutherford M.D.   On: 10/16/2023 20:05   DG Knee Complete 4 Views Right Result Date: 10/16/2023 CLINICAL DATA:  Trip and fall on right knee.  Knee pain. EXAM: RIGHT KNEE - COMPLETE 4+ VIEW COMPARISON:  None Available. FINDINGS: No acute fracture or dislocation. Mild tricompartmental osteoarthritis with medial tibiofemoral joint space narrowing and tricompartmental peripheral spurring. No significant knee joint effusion. Generalized soft tissue edema. No erosive change. IMPRESSION: 1. No acute fracture or dislocation. 2. Mild tricompartmental osteoarthritis. Electronically Signed   By: Narda Rutherford M.D.   On: 10/16/2023 14:38    Assessment & Plan:  .There are no diagnoses linked to this encounter.   I spent 34 minutes on the day of this face to face encounter reviewing patient's  most recent visit with cardiology,  nephrology,  and neurology,  prior relevant surgical and non surgical procedures, recent  labs and imaging studies, counseling on weight management,  reviewing the assessment and plan with patient, and post visit ordering and reviewing of  diagnostics and therapeutics with patient  .   Follow-up: No follow-ups on file.   Sherlene Shams, MD

## 2023-12-05 NOTE — Assessment & Plan Note (Signed)
 Managed with Mounjaro.  He did not tolerate Jardiance,  and Marcelline Deist is not covered   Lab Results  Component Value Date   HGBA1C 6.5 12/05/2023

## 2023-12-06 ENCOUNTER — Encounter: Payer: Self-pay | Admitting: Internal Medicine

## 2023-12-06 DIAGNOSIS — Z9981 Dependence on supplemental oxygen: Secondary | ICD-10-CM | POA: Insufficient documentation

## 2023-12-06 DIAGNOSIS — J9611 Chronic respiratory failure with hypoxia: Secondary | ICD-10-CM | POA: Insufficient documentation

## 2023-12-06 NOTE — Assessment & Plan Note (Signed)
 Counselled patient that his AF may be triggered by hypoxia and strongly advised to wear supplemental oxygen 24/7

## 2023-12-06 NOTE — Assessment & Plan Note (Signed)
 INR is 3.2 , therapeutic on regimen of  6 mg on Thursdays and Sundays,  5 mg all other days . Repeat inr one week   Lab Results  Component Value Date   INR 3.2 (H) 12/05/2023   INR 3.40 (A) 10/26/2023   INR 2.5 (H) 10/16/2023

## 2023-12-06 NOTE — Assessment & Plan Note (Signed)
 PORTABLE OXYGEN TANK NEEDED.  DME ORDER WRITTEN

## 2023-12-06 NOTE — Assessment & Plan Note (Signed)
 Continue torsemide at 20 mg dose and digoxin,  Strongly urrged to follow up with his pulmonologist to reschedule his sleep study vs CPAP titration study to start managing his nocturnal hypoxia.  Right heart catheterization on Jan 21 was done ,  repeat planned in mid April

## 2023-12-06 NOTE — Assessment & Plan Note (Signed)
 Diagnosed during 15 minutes of sleep during November 2024  in lab sleep study with AHI of 115 and desats to 85% . Sleep study was aborted due to patient's report of orthopnea and he was transferred to ER.  He continues to avoid use of supplemental oxygen during sleep;   has been referred to pulmonology and will need a titration study and likely BIPAP

## 2023-12-06 NOTE — Assessment & Plan Note (Signed)
 With chronic hypoxic respiratory failure likely aggravated by morbid obesity ad restrictive lung disease. Ambulatory sats today on room air fell to 85% and corrected to 89% with 2 L oxygen .  Portable oxygen tank needed

## 2023-12-06 NOTE — Assessment & Plan Note (Signed)
 Chronic, ongoing.  Continue current regimen  of warfarin 5 mg daily  6 mg twice weekly .  He has an IVC filter  Lab Results  Component Value Date   INR 3.2 (H) 12/05/2023   INR 3.40 (A) 10/26/2023   INR 2.5 (H) 10/16/2023

## 2023-12-07 ENCOUNTER — Ambulatory Visit: Payer: Self-pay

## 2023-12-07 ENCOUNTER — Other Ambulatory Visit (HOSPITAL_COMMUNITY): Payer: Self-pay | Admitting: Cardiology

## 2023-12-07 ENCOUNTER — Other Ambulatory Visit: Payer: Self-pay

## 2023-12-07 NOTE — Telephone Encounter (Unsigned)
 Copied from CRM [id number]. Topic: General - Other >> Dec 07, 2023  3:52 PM Pascal Lux wrote: Reason for CRM: Terri from [facility] - stated they keep receiving oxygen orders for the patient but he already has his oxygen equipment and they're not seeing any changes with the request. Just making sure request were not sent in error and patient has exactly what he needs.  Phone: [phone] opt 1.

## 2023-12-07 NOTE — Patient Outreach (Unsigned)
 Complex Care Management   Visit Note  12/07/2023  Name:  Ryan [name] MRN: [id number] DOB: Sep 28, 1970  Situation: Referral received for Complex Care Management related to Heart Failure and chronic respiratory failure  I obtained verbal consent from patient.  Visit completed with patient  on the phone  Background:   Past Medical History:  Diagnosis Date   (HFpEF) heart failure with preserved ejection fraction (HCC)    Arrhythmia    atrial fibrillation   CHF (congestive heart failure) (HCC)    Chronic kidney disease 08/2013   Diabetes mellitus type II, non insulin dependent (HCC)    Diabetes mellitus without complication (HCC)    per pt-pre diabetic-Dr Dario Guardian stated said pt   DVT (deep venous thrombosis) (HCC)    Over 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  Regular the workup has not been forthcoming) previously followed by Dr. Isaiah Serge at [facility]   Hepatic steatosis    Hypertension    Morbid obesity with BMI of 50.0-59.9, adult (HCC)    Oxygen deficiency    Peripheral vascular disease (HCC)    Presence of IVC filter    Has 2 IVC filters with significant thrombus burden superior to the filter.   Pulmonary embolus (HCC) 12/2016   Initially just treated with anticoagulation, but in February 2023 in April 2024 treated with EKOS thrombectomy for submassive PE.   Ventricular trigeminy    Weakness of both lower limbs     Assessment: Patient Reported Symptoms:  Cognitive Alert and oriented to person, place, and time  Neurological No symptoms reported    HEENT No symptoms reported    Cardiovascular Fainting, Fatigue, Lightheadness (patient reports having occasional symptoms of lightheadedness, fatigue and feeling faint.  He states this has occured when walking longer distance however is not often.)    Respiratory Shortness of breath    Endocrine No symptoms reported    Gastrointestinal No symptoms reported    Genitourinary No symptoms reported    Integumentary No symptoms reported    Musculoskeletal  Difficulty walking, Unsteady gait    Psychosocial No symptoms reported     Vitals:   12/07/23 1104 12/07/23 1105  BP: 120/70 128/80  SpO2: 93%     Medications Reviewed Today     Reviewed by Otho Ket, RN (Registered Nurse) on 12/07/23 at 1036  Med List Status: <None>   Medication Order Taking? Sig Documenting Provider Last Dose Status Informant  albuterol (VENTOLIN HFA) 108 (90 Base) MCG/ACT inhaler [id number] Yes Inhale 2 puffs into the lungs every 6 (six) hours as needed for wheezing or shortness of breath. Sunnie Nielsen, DO Taking Active Self           Med Note Sherrie Mustache, KYLE A   Wed Sep 14, 2023  2:01 PM)    milrinone (PRIMACOR) 20 MG/100 ML SOLN infusion [id number] Yes Inject 0.0413 mg/min into the vein continuous. Marcelino Duster, MD Taking Active   Multiple Vitamin (MULTIVITAMIN WITH MINERALS) TABS tablet [id number] Yes Take 1 tablet by mouth daily. Loyce Dys, MD Taking Active Self  pantoprazole (PROTONIX) 40 MG tablet [id number] Yes Take 1 tablet (40 mg total) by mouth daily. On an empty stomach 30 minutes prior to eating Sherlene Shams, MD Taking Active            Med Note Chilton Si, Obed Samek E   Tue Nov 22, 2023  9:20 AM) Patient states takes differently.  Takes prn  Riociguat (ADEMPAS) 2.5 MG TABS [id number] Yes Take 2.5 mg by mouth 3 (three)  times daily. Marygrace Drought, RPH-CPP Taking Active   tirzepatide [facility]) 7.5 MG/0.5ML Pen [id number] Yes Inject 7.5 mg into the skin once a week. Sherlene Shams, MD Taking Active   torsemide (DEMADEX) 20 MG tablet [id number] Yes Take 80 mg (4 tablets) by mouth daily for 3 days, then resume 40 mg (2 tablets) by mouth daily [name], MD Taking Active   warfarin (COUMADIN) 1 MG tablet [id number] Yes Take with 5 mg on Thursdays and Sundays Sherlene Shams, MD Taking Active            Med Note Chilton Si, Kieli Golladay E   Tue Nov 22, 2023  9:22 AM) Patient states he takes a 7 mg on Thursdays and Sundays depending on INR and as  directed by provider.   warfarin (COUMADIN) 5 MG tablet [id number] Yes Take 1 tablet (5 mg total) by mouth daily. Sherlene Shams, MD Taking Active             Recommendation:   Ongoing follow up with RN case manager for  Follow Up Plan:   Telephone follow up appointment date/time:  *** George Ina RN, BSN, CCM [place]  [facility], Population Health Case Manager Phone: [phone]

## 2023-12-07 NOTE — Telephone Encounter (Signed)
 Spoke with Terri at [facility] and she stated that pt already has portable oxygen tanks and he does will not qualify for the portable concentrator that he is wanting until he uses 8 tanks in 3 consecutive months.

## 2023-12-08 NOTE — Patient Instructions (Signed)
 Visit Information  Thank you for taking time to visit with me today. Please don't hesitate to contact me if I can be of assistance to you before our next scheduled appointment.  Our next appointment is by telephone on 01/11/24 at 10 am Please call the care guide team at [phone] if you need to cancel or reschedule your appointment.   Following is a copy of your care plan:   Goals Addressed             This Visit's Progress    VBCI RN Care Plan- heart failure/ chronic respiratory failure       Problems:  Chronic Disease Management support and education needs related to CHF and chronic respiratory failure  Goal: Over the next 3 months the Patient will attend all scheduled medical appointments: with providers as evidenced by chart review and/ or patient report        continue to work with RN Care Manager and/or Social Worker to address care management and care coordination needs related to CHF and chronic respiratory therapy  as evidenced by adherence to CM Team Scheduled appointments     demonstrate Ongoing adherence to prescribed treatment plan for CHF and chronic respiratory therapy  as evidenced by patient report and / or chart review.  take all medications exactly as prescribed and will call provider for medication related questions as evidenced by patient report    Patient will report reschedule sleep study as advised by provider.  Will wear oxygen 24/7 as evidenced by patient report.   Interventions:   Heart Failure Interventions:  (Status:  New goal.) Long Term Goal Assessed need for readable accurate scales in home Advised patient to weigh each morning after emptying bladder Discussed the importance of keeping all appointments with provider Assessed social determinant of health barriers   Patient Self-Care Activities:  Attend all scheduled provider appointments Attend church or other social activities Call pharmacy for medication refills 3-7 days in advance of running  out of medications Call provider office for new concerns or questions  Perform all self care activities independently  Take medications as prescribed   call office if I gain more than 2 pounds in one day or 5 pounds in one week do ankle pumps when sitting keep legs up while sitting watch for swelling in feet, ankles and legs every day weigh myself daily Reschedule sleep study Wear oxygen 24/7 as advised by provider.   Plan:  Telephone follow up appointment with care management team member scheduled for:  01/11/24 at 10 am             Please call the Suicide and Crisis Lifeline: 988 call 1-800-273-TALK (toll free, 24 hour hotline) if you are experiencing a Mental Health or Behavioral Health Crisis or need someone to talk to.  Patient verbalizes understanding of instructions and care plan provided today and agrees to view in MyChart. Active MyChart status and patient understanding of how to access instructions and care plan via MyChart confirmed with patient.     George Ina RN, BSN, CCM [employer], Population Health Case Manager Phone: [phone]

## 2023-12-11 LAB — POCT INR

## 2023-12-12 ENCOUNTER — Encounter: Payer: Self-pay | Admitting: Internal Medicine

## 2023-12-12 ENCOUNTER — Telehealth: Payer: Self-pay

## 2023-12-12 DIAGNOSIS — Z7901 Long term (current) use of anticoagulants: Secondary | ICD-10-CM

## 2023-12-12 NOTE — Telephone Encounter (Signed)
 Pt called stating that he does not have any transportation and no options for transportation, for his heart cath currently scheduled for 4/18 at [facility] with Dr. Alease Amend.  He is requesting to reschedule at [facility] because he states transportation needs are not likely to change.  Will send to Dr. Alease Amend for advice on when to reschedule. Aaron Aas

## 2023-12-13 ENCOUNTER — Encounter: Payer: Self-pay | Admitting: Internal Medicine

## 2023-12-13 NOTE — Assessment & Plan Note (Addendum)
 INR is low  now  using the MD INR and April 14 inr is  2.4  on 5 mg x 5,  6 mg x 2 - 37 mg weekly.  Increase dose to 6 mg x 3,  5 mg x 4 = 38 mg weekly

## 2023-12-14 ENCOUNTER — Telehealth (HOSPITAL_COMMUNITY): Payer: Self-pay | Admitting: Licensed Clinical Social Worker

## 2023-12-14 ENCOUNTER — Ambulatory Visit: Payer: No Typology Code available for payment source | Admitting: Internal Medicine

## 2023-12-14 ENCOUNTER — Telehealth: Payer: Self-pay | Admitting: Pulmonary Disease

## 2023-12-14 NOTE — Telephone Encounter (Signed)
 H&V Care Navigation CSW Progress Note  Clinical Social Worker contacted patient by phone to assist with transportation needs.  Patient is participating in a Managed Medicaid Plan:  No  CSW contacted patient to discuss need for transport to [facility] Friday morning for cardiac cath procedure. Patient states he is ambulatory and unfortunately has no transport options. CSW contacted [facility] and confirmed availability to transport patient for 5:30 am arrival for procedure. [facility] will confirm pick up time with patient. CSW available as needed. Aubry Blase, LCSW, CCSW-MCS [phone]   SDOH Screenings   Food Insecurity: No Food Insecurity (12/07/2023)  Housing: Low Risk  (12/07/2023)  Transportation Needs: Unmet Transportation Needs (12/14/2023)  Utilities: Not At Risk (12/07/2023)  Depression (PHQ2-9): Low Risk  (12/05/2023)  Financial Resource Strain: Low Risk  (08/18/2023)  Physical Activity: Insufficiently Active (08/18/2023)  Social Connections: Moderately Isolated (10/10/2023)  Stress: No Stress Concern Present (08/18/2023)  Tobacco Use: Low Risk  (12/05/2023)

## 2023-12-14 NOTE — Telephone Encounter (Signed)
 Patient did have in lab sleep study done 07/18/2023. Not sure why new in lab sleep study was ordered

## 2023-12-14 NOTE — Telephone Encounter (Signed)
 Dr. Lucina Sabal you saw this patient on 11/16/23 and order split night sleep study. We received a note from [facility] that the patient CXL the sleep study appt and didn't reschedule.

## 2023-12-14 NOTE — Telephone Encounter (Signed)
 Clifford thanks

## 2023-12-16 ENCOUNTER — Ambulatory Visit (HOSPITAL_COMMUNITY)
Admission: RE | Admit: 2023-12-16 | Discharge: 2023-12-16 | Disposition: A | Discharge status: Home / Self Care | Attending: Cardiology | Admitting: Cardiology

## 2023-12-16 ENCOUNTER — Encounter (HOSPITAL_COMMUNITY)
Admission: RE | Disposition: A | Discharge status: Home / Self Care | Payer: Self-pay | Source: Home / Self Care | Attending: Cardiology

## 2023-12-16 ENCOUNTER — Other Ambulatory Visit: Payer: Self-pay

## 2023-12-16 DIAGNOSIS — Z7985 Long-term (current) use of injectable non-insulin antidiabetic drugs: Secondary | ICD-10-CM | POA: Diagnosis not present

## 2023-12-16 DIAGNOSIS — I509 Heart failure, unspecified: Secondary | ICD-10-CM

## 2023-12-16 DIAGNOSIS — J9611 Chronic respiratory failure with hypoxia: Secondary | ICD-10-CM | POA: Insufficient documentation

## 2023-12-16 DIAGNOSIS — N183 Chronic kidney disease, stage 3 unspecified: Secondary | ICD-10-CM | POA: Insufficient documentation

## 2023-12-16 DIAGNOSIS — I2724 Chronic thromboembolic pulmonary hypertension: Secondary | ICD-10-CM | POA: Insufficient documentation

## 2023-12-16 DIAGNOSIS — I5032 Chronic diastolic (congestive) heart failure: Secondary | ICD-10-CM | POA: Insufficient documentation

## 2023-12-16 DIAGNOSIS — I82509 Chronic embolism and thrombosis of unspecified deep veins of unspecified lower extremity: Secondary | ICD-10-CM | POA: Diagnosis not present

## 2023-12-16 DIAGNOSIS — I272 Pulmonary hypertension, unspecified: Secondary | ICD-10-CM | POA: Diagnosis not present

## 2023-12-16 DIAGNOSIS — Z95828 Presence of other vascular implants and grafts: Secondary | ICD-10-CM | POA: Insufficient documentation

## 2023-12-16 DIAGNOSIS — Z7901 Long term (current) use of anticoagulants: Secondary | ICD-10-CM | POA: Diagnosis not present

## 2023-12-16 DIAGNOSIS — I480 Paroxysmal atrial fibrillation: Secondary | ICD-10-CM | POA: Insufficient documentation

## 2023-12-16 DIAGNOSIS — Z79899 Other long term (current) drug therapy: Secondary | ICD-10-CM | POA: Insufficient documentation

## 2023-12-16 DIAGNOSIS — G4731 Primary central sleep apnea: Secondary | ICD-10-CM | POA: Diagnosis not present

## 2023-12-16 DIAGNOSIS — Z9981 Dependence on supplemental oxygen: Secondary | ICD-10-CM | POA: Insufficient documentation

## 2023-12-16 DIAGNOSIS — E1122 Type 2 diabetes mellitus with diabetic chronic kidney disease: Secondary | ICD-10-CM | POA: Insufficient documentation

## 2023-12-16 DIAGNOSIS — I5022 Chronic systolic (congestive) heart failure: Secondary | ICD-10-CM

## 2023-12-16 HISTORY — PX: RIGHT HEART CATH: CATH118263

## 2023-12-16 HISTORY — PX: PULMONARY ANGIOGRAPHY: CATH118325

## 2023-12-16 LAB — POCT I-STAT, CHEM 8
BUN: 12 mg/dL (ref 6–20)
Calcium, Ion: 1.16 mmol/L (ref 1.15–1.40)
Chloride: 104 mmol/L (ref 98–111)
Creatinine, Ser: 1.6 mg/dL — ABNORMAL HIGH (ref 0.61–1.24)
Glucose, Bld: 110 mg/dL — ABNORMAL HIGH (ref 70–99)
HCT: 41 % (ref 39.0–52.0)
Hemoglobin: 13.9 g/dL (ref 13.0–17.0)
Potassium: 3.6 mmol/L (ref 3.5–5.1)
Sodium: 141 mmol/L (ref 135–145)
TCO2: 24 mmol/L (ref 22–32)

## 2023-12-16 LAB — CBC
HCT: 40.6 % (ref 39.0–52.0)
Hemoglobin: 12.9 g/dL — ABNORMAL LOW (ref 13.0–17.0)
MCH: 28.5 pg (ref 26.0–34.0)
MCHC: 31.8 g/dL (ref 30.0–36.0)
MCV: 89.6 fL (ref 80.0–100.0)
Platelets: 295 10*3/uL (ref 150–400)
RBC: 4.53 MIL/uL (ref 4.22–5.81)
RDW: 15.2 % (ref 11.5–15.5)
WBC: 4 10*3/uL (ref 4.0–10.5)
nRBC: 0 % (ref 0.0–0.2)

## 2023-12-16 LAB — POCT I-STAT EG7
Acid-Base Excess: 1 mmol/L (ref 0.0–2.0)
Acid-Base Excess: 2 mmol/L (ref 0.0–2.0)
Bicarbonate: 25.9 mmol/L (ref 20.0–28.0)
Bicarbonate: 26.3 mmol/L (ref 20.0–28.0)
Calcium, Ion: 1.18 mmol/L (ref 1.15–1.40)
Calcium, Ion: 1.17 mmol/L (ref 1.15–1.40)
HCT: 41 % (ref 39.0–52.0)
HCT: 41 % (ref 39.0–52.0)
Hemoglobin: 13.9 g/dL (ref 13.0–17.0)
Hemoglobin: 13.9 g/dL (ref 13.0–17.0)
O2 Saturation: 44 %
O2 Saturation: 48 %
Potassium: 3.6 mmol/L (ref 3.5–5.1)
Potassium: 3.6 mmol/L (ref 3.5–5.1)
Sodium: 142 mmol/L (ref 135–145)
Sodium: 142 mmol/L (ref 135–145)
TCO2: 27 mmol/L (ref 22–32)
TCO2: 28 mmol/L (ref 22–32)
pCO2, Ven: 40.9 mmHg — ABNORMAL LOW (ref 44–60)
pCO2, Ven: 40.8 mmHg — ABNORMAL LOW (ref 44–60)
pH, Ven: 7.409 (ref 7.25–7.43)
pH, Ven: 7.418 (ref 7.25–7.43)
pO2, Ven: 24 mmHg — CL (ref 32–45)
pO2, Ven: 25 mmHg — CL (ref 32–45)

## 2023-12-16 LAB — GLUCOSE, CAPILLARY: Glucose-Capillary: 108 mg/dL — ABNORMAL HIGH (ref 70–99)

## 2023-12-16 LAB — PROTIME-INR
INR: 2.2 — ABNORMAL HIGH (ref 0.8–1.2)
Prothrombin Time: 24.7 s — ABNORMAL HIGH (ref 11.4–15.2)

## 2023-12-16 SURGERY — RIGHT HEART CATH
Anesthesia: LOCAL

## 2023-12-16 MED ORDER — TORSEMIDE 20 MG PO TABS: 40.0000 mg | ORAL_TABLET | Freq: Every day | ORAL | 3 refills | Status: DC

## 2023-12-16 MED ORDER — LIDOCAINE HCL (PF) 1 % IJ SOLN
INTRAMUSCULAR | Status: DC | PRN
  Administered 2023-12-16: 5 mL

## 2023-12-16 MED ORDER — LIDOCAINE HCL (PF) 1 % IJ SOLN
INTRAMUSCULAR | Status: AC
  Filled 2023-12-16: qty 30

## 2023-12-16 MED ORDER — SODIUM CHLORIDE 0.9 % IV SOLN: INTRAVENOUS | Status: DC

## 2023-12-16 MED ORDER — HEPARIN (PORCINE) IN NACL 1000-0.9 UT/500ML-% IV SOLN
INTRAVENOUS | Status: DC | PRN
  Administered 2023-12-16 (×2): 500 mL

## 2023-12-16 MED ORDER — IOHEXOL 350 MG/ML SOLN
INTRAVENOUS | Status: DC | PRN
  Administered 2023-12-16: 140 mL

## 2023-12-16 SURGICAL SUPPLY — 11 items
CATH DIAG 6FR PIGTAIL ANGLED (CATHETERS) IMPLANT
CATH SWAN GANZ 7F STRAIGHT (CATHETERS) IMPLANT
GUIDEWIRE .025 260CM (WIRE) IMPLANT
KIT MICROPUNCTURE NIT STIFF (SHEATH) IMPLANT
KIT SYRINGE INJ CVI SPIKEX1 (MISCELLANEOUS) IMPLANT
PACK CARDIAC CATHETERIZATION (CUSTOM PROCEDURE TRAY) ×2 IMPLANT
SET ATX-X65L (MISCELLANEOUS) IMPLANT
SHEATH PINNACLE 7F 10CM (SHEATH) IMPLANT
SHEATH PROBE COVER 6X72 (BAG) IMPLANT
SHIELD CATH-GARD CONTAMINATION (MISCELLANEOUS) IMPLANT
WIRE EMERALD 3MM-J .035X260CM (WIRE) IMPLANT

## 2023-12-16 NOTE — Interval H&P Note (Signed)
 History and Physical Interval Note:  12/16/2023 8:02 AM  Ryan [name]  has presented today for surgery, with the diagnosis of chf - hp.  The various methods of treatment have been discussed with the patient and family. After consideration of risks, benefits and other options for treatment, the patient has consented to  Procedure(s): RIGHT HEART CATH (N/A) as a surgical intervention.  The patient's history has been reviewed, patient examined, no change in status, stable for surgery.  I have reviewed the patient's chart and labs.  Questions were answered to the patient's satisfaction.     [name]

## 2023-12-16 NOTE — Discharge Instructions (Signed)
 Internal jugular Site Care   This sheet gives you information about how to care for yourself after your procedure. Your health care provider may also give you more specific instructions. If you have problems or questions, contact your health care provider. What can I expect after the procedure? After the procedure, it is common to have: Bruising and tenderness at the catheter insertion area. Follow these instructions at home:  Insertion site care Follow instructions from your health care provider about how to take care of your insertion site. Make sure you: Wash your hands with soap and water  before you change your bandage (dressing). If soap and water  are not available, use hand sanitizer. Remove your dressing as told by your health care provider. In 24 hours Check your insertion site every day for signs of infection. Check for: Redness, swelling, or pain. Pus or a bad smell. Warmth. You may shower 24-48 hours after the procedure. Do not apply powder or lotion to the site.  Activity For 24 hours after the procedure, or as directed by your health care provider: Do not push or pull heavy objects with the affected arm. Do not drive yourself home from the hospital or clinic. You may drive 24 hours after the procedure unless your health care provider tells you not to. Do not lift anything that is heavier than 10 lb (4.5 kg), or the limit that you are told, until your health care provider says that it is safe.  For 24 hours

## 2023-12-20 ENCOUNTER — Encounter (HOSPITAL_COMMUNITY): Payer: Self-pay | Admitting: Cardiology

## 2023-12-31 LAB — POCT INR

## 2024-01-02 ENCOUNTER — Encounter: Payer: Self-pay | Admitting: Internal Medicine

## 2024-01-02 ENCOUNTER — Other Ambulatory Visit (HOSPITAL_COMMUNITY): Payer: Self-pay | Admitting: Cardiology

## 2024-01-03 ENCOUNTER — Encounter: Payer: Self-pay | Admitting: Internal Medicine

## 2024-01-06 ENCOUNTER — Telehealth: Payer: Self-pay | Admitting: Cardiology

## 2024-01-06 NOTE — Telephone Encounter (Signed)
 Called to confirm/remind patient of their appointment at the [facility] on 01/09/24.   Appointment:   [x] Confirmed  [] Left mess   [] No answer/No voice mail  [] VM Full/unable to leave message  [] Phone not in service  Patient reminded to bring all medications and/or complete list.  Confirmed patient has transportation. Gave directions, instructed to utilize valet parking.

## 2024-01-09 ENCOUNTER — Ambulatory Visit: Attending: Cardiology | Admitting: Cardiology

## 2024-01-09 VITALS — BP 114/72 | HR 99 | Wt 373.0 lb

## 2024-01-09 DIAGNOSIS — Z79899 Other long term (current) drug therapy: Secondary | ICD-10-CM | POA: Insufficient documentation

## 2024-01-09 DIAGNOSIS — Z7901 Long term (current) use of anticoagulants: Secondary | ICD-10-CM | POA: Diagnosis not present

## 2024-01-09 DIAGNOSIS — N1831 Chronic kidney disease, stage 3a: Secondary | ICD-10-CM | POA: Insufficient documentation

## 2024-01-09 DIAGNOSIS — I2724 Chronic thromboembolic pulmonary hypertension: Secondary | ICD-10-CM | POA: Insufficient documentation

## 2024-01-09 DIAGNOSIS — I272 Pulmonary hypertension, unspecified: Secondary | ICD-10-CM | POA: Diagnosis present

## 2024-01-09 DIAGNOSIS — I82221 Chronic embolism and thrombosis of inferior vena cava: Secondary | ICD-10-CM | POA: Insufficient documentation

## 2024-01-09 DIAGNOSIS — Z86711 Personal history of pulmonary embolism: Secondary | ICD-10-CM | POA: Insufficient documentation

## 2024-01-09 DIAGNOSIS — E669 Obesity, unspecified: Secondary | ICD-10-CM | POA: Diagnosis not present

## 2024-01-09 DIAGNOSIS — I890 Lymphedema, not elsewhere classified: Secondary | ICD-10-CM | POA: Insufficient documentation

## 2024-01-09 NOTE — Patient Instructions (Addendum)
 Follow-Up in: 2 months with Dr. Alease Amend  Our Doctors' schedules are NOT open yet for 2 months. We will place you on our recall list. Once they are available, we will call you to schedule your follow up appointment.   At the [facility], you and your health needs are our priority. We have a designated team specialized in the treatment of Heart Failure. This Care Team includes your primary Heart Failure Specialized Cardiologist (physician), Advanced Practice Providers (APPs- Physician Assistants and Nurse Practitioners), and Pharmacist who all work together to provide you with the care you need, when you need it.   You may see any of the following providers on your designated Care Team at your next follow up:  Dr. Jules Oar Dr. Peder Bourdon Dr. Alwin Baars Dr. Judyth Nunnery Shawnee Dellen, FNP Bevely Brush, RPH-CPP  Please be sure to bring in all your medications bottles to every appointment.   Need to Contact Us :  If you have any questions or concerns before your next appointment please send us  a message through [facility] or call our office at [phone].    TO LEAVE A MESSAGE FOR THE NURSE SELECT OPTION 2, PLEASE LEAVE A MESSAGE INCLUDING: YOUR NAME DATE OF BIRTH CALL BACK NUMBER REASON FOR CALL**this is important as we prioritize the call backs  YOU WILL RECEIVE A CALL BACK THE SAME DAY AS LONG AS YOU CALL BEFORE 4:00 PM

## 2024-01-10 ENCOUNTER — Ambulatory Visit: Attending: Pulmonary Disease

## 2024-01-10 DIAGNOSIS — E669 Obesity, unspecified: Secondary | ICD-10-CM | POA: Diagnosis not present

## 2024-01-10 DIAGNOSIS — R0602 Shortness of breath: Secondary | ICD-10-CM

## 2024-01-10 DIAGNOSIS — I110 Hypertensive heart disease with heart failure: Secondary | ICD-10-CM | POA: Diagnosis not present

## 2024-01-10 DIAGNOSIS — Z6841 Body Mass Index (BMI) 40.0 and over, adult: Secondary | ICD-10-CM | POA: Insufficient documentation

## 2024-01-10 DIAGNOSIS — E119 Type 2 diabetes mellitus without complications: Secondary | ICD-10-CM | POA: Insufficient documentation

## 2024-01-10 DIAGNOSIS — G4733 Obstructive sleep apnea (adult) (pediatric): Secondary | ICD-10-CM | POA: Diagnosis not present

## 2024-01-10 DIAGNOSIS — G4710 Hypersomnia, unspecified: Secondary | ICD-10-CM | POA: Insufficient documentation

## 2024-01-10 DIAGNOSIS — R0683 Snoring: Secondary | ICD-10-CM | POA: Insufficient documentation

## 2024-01-10 DIAGNOSIS — I509 Heart failure, unspecified: Secondary | ICD-10-CM | POA: Diagnosis not present

## 2024-01-11 ENCOUNTER — Other Ambulatory Visit: Payer: Self-pay

## 2024-01-11 NOTE — Patient Instructions (Signed)
 Visit Information  Thank you for taking time to visit with me today. Please don't hesitate to contact me if I can be of assistance to you before our next scheduled appointment.  Your next care management appointment is by telephone on 02/06/24 at 1 pm  Telephone follow-up in 1 month with RN case manager  Please call the care guide team at [phone] if you need to cancel, schedule, or reschedule an appointment.   Please call the Suicide and Crisis Lifeline: 988 call 1-800-273-TALK (toll free, 24 hour hotline) if you are experiencing a Mental Health or Behavioral Health Crisis or need someone to talk to.  Verba Girt RN, BSN, CCM [employer], Population Health Case Manager Phone: [phone]

## 2024-01-11 NOTE — Patient Outreach (Signed)
 Complex Care Management   Visit Note  01/11/2024  Name:  Ryan [name] MRN: [id number] DOB: 24-Sep-1970  Situation: Referral received for Complex Care Management related to Heart Failure I obtained verbal consent from Patient.  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  Regular the workup has not been forthcoming) previously followed by Dr. [facility]   Hepatic steatosis    Hypertension    Morbid obesity with BMI of 50.0-59.9, adult (HCC)    Oxygen deficiency    Peripheral vascular disease (HCC)    Presence of IVC filter    Has 2 IVC filters with significant thrombus burden superior to the filter.   Pulmonary embolus (HCC) 12/2016   Initially just treated with anticoagulation, but in February 2023 in April 2024 treated with EKOS thrombectomy for submassive PE.   Ventricular trigeminy    Weakness of both lower limbs     Assessment: Patient Reported Symptoms:  Cognitive Cognitive Status: Alert and oriented to person, place, and time, Insightful and able to interpret abstract concepts, Normal speech and language skills Cognitive/Intellectual Conditions Management [RPT]: None reported or documented in medical history or problem list      Neurological Neurological Review of Symptoms: No symptoms reported    HEENT HEENT Symptoms Reported: No symptoms reported      Cardiovascular Cardiovascular Symptoms Reported: Fatigue, Fainting, Lightheadness Does patient have uncontrolled Hypertension?: No Cardiovascular Conditions: Heart failure,  Hypertension Cardiovascular Management Strategies: Routine screening, Medication therapy, Diet modification Weight: (!) 370 lb (167.8 kg) Cardiovascular Self-Management Outcome: 4 (good) Cardiovascular Comment: Patient reports he is doing a little better. He states he notices his stamina is better. He reports still having episodes of SOB with exertion that makes him feel like he could pass out. He reports quick recovery when he sits down to rest. Patient states he is still only wearing his Oxygen at 2 L PRN and not around the clock. Patient reports a slight increase in weight gain. He states he did not take his fluid pill yesterday due to being scheduled for sleep study.  States he will take today.  Denies weighing yesterday or today. Patient reports having heart cath on 12/16/23. Next follow up with primary provider is 03/07/24  Respiratory Respiratory Symptoms Reported: Shortness of breath, Dry cough Additional Respiratory Details: patient reports having mild SOB and dry cough. Respiratory Conditions: Shortness of breath Respiratory Comment: patient reports having sleep study on 01/10/24. Reports wearing his oxygen at hs and as needed. Reports his oxygen level is normally 92-95.  Reports having episode on 01/09/24 when going to cardiology appointment. He states he was able to walk into his cardiology office without DME assistance. He states his oxygen level dropped between 85-88. He states once he sat down and rested his oxygen level quickly recovered into the 90's.  Patient states he has not been carrying his oxygen with him when he goes out. Next follow up with pulmonary 03/08/24  Endocrine Patient reports the following symptoms related to hypoglycemia or  hyperglycemia : No symptoms reported Is patient diabetic?: No Endocrine Conditions: Other Other Endocrine Conditions: pre-diabetes per patient.  Gastrointestinal Gastrointestinal Symptoms Reported: No symptoms reported Additional Gastrointestinal  Details: patient states he recently discontinued the mounjaro  because he was having diarrhea and mild abdominal discomfort.  Patient states his provider is aware of this.      Genitourinary Genitourinary Symptoms Reported: No symptoms reported    Integumentary Integumentary Symptoms Reported: No symptoms reported    Musculoskeletal Musculoskelatal Symptoms Reviewed: Weakness, Muscle pain, Unsteady gait Musculoskeletal Conditions: Unsteady gait Musculoskeletal Management Strategies: Routine screening, Medical device Musculoskeletal Comment: Patient states he is very careful with walking due to having occasional leg weakness. He reports using his walker as needed. Patient states his right knee injury has healed. Denies any further pain and/ or need for crutches. Patient states he is expecting call from cardiology with [facility] to schedule follow up appointment.      Psychosocial Psychosocial Symptoms Reported: No symptoms reported           Vitals:   01/11/24 1036  BP: 114/71  SpO2: 93%    Medications Reviewed Today     Reviewed by Doniesha Landau E, RN (Registered Nurse) on 01/11/24 at 1017  Med List Status: <None>   Medication Order Taking? Sig Documenting Provider Last Dose Status Informant  albuterol  (VENTOLIN  HFA) 108 (90 Base) MCG/ACT inhaler [id number] No Inhale 2 puffs into the lungs every 6 (six) hours as needed for wheezing or shortness of breath. Alexander, Natalie, DO Taking Active Self           Med Note Shann Darnel, KYLE A   Wed Sep 14, 2023  2:01 PM)    milrinone  (PRIMACOR ) 20 MG/100 ML SOLN infusion [id number] No Inject 0.0413 mg/min into the vein continuous. Aisha Hove, MD Taking Active Self  Multiple Vitamin (MULTIVITAMIN WITH MINERALS) TABS tablet [id number] No Take 1 tablet by mouth daily. Ezzard Holms, MD Taking Active Self  pantoprazole  (PROTONIX ) 40 MG tablet [id number] No Take 1 tablet (40 mg total) by mouth daily. On an empty stomach 30 minutes prior to  eating Thersia Flax, MD Taking Active Self           Med Note Vivian Groom, MELISSA R   Wed Dec 14, 2023  9:25 AM)    Riociguat  (ADEMPAS ) 2.5 MG TABS [id number] No Take 2.5 mg by mouth 3 (three) times daily. Margean Sheehan, RPH-CPP Taking Active Self  tirzepatide  (MOUNJARO ) 7.5 MG/0.5ML Pen [id number] No Inject 7.5 mg into the skin once a week. Thersia Flax, MD Taking Active Self  torsemide  (DEMADEX ) 20 MG tablet [id number] No Take 2 tablets (40 mg total) by mouth daily. Lauralee Poll, MD Taking Active   warfarin (COUMADIN ) 1 MG tablet [id number] No Take with 5 mg on Thursdays and Sundays  Patient taking differently: Take 1 mg by mouth See admin instructions. 5 mg take with 1 mg = 6 mg Tu Th and Sun   Tullo, Teresa L, MD Taking Active Self           Med Note Vivian Groom, MELISSA R   Wed Dec 14, 2023  9:30 AM)    warfarin (COUMADIN ) 5 MG tablet [id number] No Take 1 tablet (5 mg total) by mouth daily.  Patient taking differently: Take 5 mg by mouth See admin instructions. 5 mg Mon, Wed, Fri, Sat and 6 mg Tu Th and Sun   Tullo, Teresa L, MD Taking Active Self  Recommendation:   PCP Follow-up  Follow Up Plan:   Telephone follow-up in 1 month with RN care manager  Verba Girt RN, BSN, CCM [place]  [facility], Population Health Case Manager Phone: [phone]

## 2024-01-12 NOTE — Progress Notes (Signed)
 ADVANCED HEART FAILURE FOLLOW UP CLINIC NOTE  Referring Physician: Thersia Flax, MD  Primary Care: Thersia Flax, MD Primary Cardiologist:  HPI: Ryan [name] is a 53 y.o. male who presents for follow up of pulmonary hypertension.      Patient seen after admission in September 2024 for recurrent pulmonary embolism. He underwent mechanical thrombectomy with vascular surgery, and reportedly felt significantly improved at discharge. He was discharged on warfarin and reports that he has been compliant with his medical therapy since that time. He has been followed in wound care for a chronic left lower extremity ulcer, but had been told that it was improving.   He has an extensive family history of clotting disorders and has been on Xarelto  and warfarin in the past.  He reports that the men in his family are extremely prone to clotting.   "Follows with Hematology (Dr. Loralie Rocher), last seen in 2018. 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 Also strong family history of clotting. Work-up for hypercoagulability has been unrevealing thus far. Has had prior admissions for lytic therapy at [facility] in 11/2014 (when he had complete occlusion of the right common femoral vein) and in 12/2016 (when he had a large PE in the right main pulmonary artery causing bilateral multiple segmental and subsegmental pulmonary arteries). 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  MTHFR gene mutation per history    - 34 (53 yo) - RLE DVT. Treated with warfarin.  - 1995 - LLE DVT, off warfarin.  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 S/p IVC filter x 2 (2006, 2012).  - 2016: Ilio-femoral DVT,  treated with tPA. Thrombus extended along common iliac veins bilaterally and along IVC to 3.7 cm above the level of the IVC filter. Hospitalization c/b CIN/ARF requiring CRRT.  - 11/2016: PE in the setting of non-adherence to eliquis  x 3 days. Treated with warfarin - 05/2017: Reported several interval VTE events. Negative thrombophelia eval (including prothrombin gene mutation).  - 09/2020: Admitted for PE  -11/2022: PE with thrombectomy -05/2023: PE with thrombectomy  During admission in January he underwent right heart catheterization that showed severely elevated filling pressures as well as severely reduced cardiac index.  He was placed on IV milrinone  with extensive diuresis.  Transition to riociguat  at discharge.       SUBJECTIVE:  Patient overall reports that he is doing fairly well.  His weight has been overall stable, he was able to walk into his office visit today.  He still gets short of breath with mild to moderate exertion but it is much improved from prior.  His PICC line site was examined and looks good.  He continues to take his diuretics as scheduled.  PMH, current medications, allergies, social history, and family history reviewed in epic.  PHYSICAL EXAM: Vitals:   01/09/24 1443  BP: 114/72  Pulse: 99  SpO2: (!) 87%   GENERAL: Chronically ill-appearing PULM: Normal work of breathing CARDIAC:  JVP: Mildly elevated          Normal rate with regular rhythm.  Systolic murmur, chronic, massive, chronic bilateral lower extremity edema to his groin ABDOMEN: Soft, non-tender, non-distended. NEUROLOGIC: Patient is oriented x3 with no focal or lateralizing  neurologic deficits.    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  Currently on Adempas  and IV milrinone .  NYHA class IIIb symptoms.  Pulmonary angiogram obtained, will discuss with [facility] for possible balloon angioplasty. -Continue Adempas  2.5 mg 3 times daily, appreciate pharmacy assistance -Continue milrinone  0.25 mcg/kg/min, could consider weaning post angioplasty -[facility] CTEPH clinic referral -Continue torsemide  40mg  daily -Continue warfarin, appreciate pharmacy assistance  Chronic venous occlusion: Unfortunately explains his significant edema, his IVC is chronically occluded due to indwelling IVC filters.  Has been evaluated by vascular surgery in the past, would require open surgery and vena cava reconstruction.  Given his pulmonary hypertension is not a surgical candidate. -Fluid mobilization as able, continue diuretics  CKD stage IIIa - Stable near baseline at 1.45 on most recent set of labs on 4/25  Obesity: Multifactorial. - Continue GLP-1  Follow up in 2 months  Arta Lark, MD Advanced Heart Failure Mechanical Circulatory Support 01/12/24

## 2024-01-16 LAB — POCT INR

## 2024-01-17 ENCOUNTER — Ambulatory Visit: Payer: Self-pay | Admitting: Internal Medicine

## 2024-01-19 ENCOUNTER — Encounter: Payer: Self-pay | Admitting: Pulmonary Disease

## 2024-01-19 DIAGNOSIS — G4733 Obstructive sleep apnea (adult) (pediatric): Secondary | ICD-10-CM

## 2024-01-25 LAB — POCT INR

## 2024-01-26 ENCOUNTER — Ambulatory Visit: Payer: Self-pay | Admitting: Internal Medicine

## 2024-01-27 ENCOUNTER — Other Ambulatory Visit (HOSPITAL_COMMUNITY): Payer: Self-pay | Admitting: Cardiology

## 2024-01-27 ENCOUNTER — Ambulatory Visit (INDEPENDENT_AMBULATORY_CARE_PROVIDER_SITE_OTHER): Payer: Self-pay | Admitting: Sleep Medicine

## 2024-01-27 ENCOUNTER — Telehealth (HOSPITAL_COMMUNITY): Payer: Self-pay

## 2024-01-27 NOTE — Telephone Encounter (Signed)
 Referral sent to [facility] CTEPH program

## 2024-01-27 NOTE — Telephone Encounter (Signed)
 Please notify patient that PSG revealed severe OSA, recommend proceeding with auto BIPAP therapy set to min EPAP 4 cm H2O, max IPAP 25 cm H2O, pressure support 5 cm H2O with 2.5 lpm O2. Please also schedule a 3 month follow up visit if patient wishes to proceed with BIPAP therapy. Thanks

## 2024-01-30 NOTE — Progress Notes (Signed)
 Printed letter to be mailed to patient with results.

## 2024-01-30 NOTE — Progress Notes (Signed)
 Attempted to call patient and only got a busy signal. Will try again.

## 2024-02-01 ENCOUNTER — Telehealth: Payer: Self-pay | Admitting: Pulmonary Disease

## 2024-02-01 DIAGNOSIS — G4733 Obstructive sleep apnea (adult) (pediatric): Secondary | ICD-10-CM

## 2024-02-01 NOTE — Telephone Encounter (Signed)
 Referral for Sleep apnea placed for [facility].

## 2024-02-01 NOTE — Telephone Encounter (Signed)
 Order placed

## 2024-02-01 NOTE — Telephone Encounter (Signed)
 Noted. Nothing further needed.

## 2024-02-02 ENCOUNTER — Encounter: Payer: Self-pay | Admitting: Cardiology

## 2024-02-06 ENCOUNTER — Other Ambulatory Visit: Payer: Self-pay

## 2024-02-06 ENCOUNTER — Telehealth

## 2024-02-06 NOTE — Patient Outreach (Signed)
 Complex Care Management   Visit Note  02/06/2024  Name:  Ryan [name] MRN: [id number] DOB: November 28, 1970  Situation: Referral received for Complex Care Management related to Heart Failure and respiratory issues/ OSA I obtained verbal consent from Patient.  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  Regular the workup has not been forthcoming) previously followed by Dr. Loralie Rocher at [facility]   Hepatic steatosis    Hypertension    Morbid obesity with BMI of 50.0-59.9, adult (HCC)    Oxygen deficiency    Peripheral vascular disease (HCC)    Presence of IVC filter    Has 2 IVC filters with significant thrombus burden superior to the filter.   Pulmonary embolus (HCC) 12/2016   Initially just treated with anticoagulation, but in February 2023 in April 2024 treated with EKOS thrombectomy for submassive PE.   Ventricular trigeminy    Weakness of both lower limbs     Assessment: Patient Reported Symptoms:  Cognitive Cognitive Status: Alert and oriented to person, place, and time, Insightful and able to interpret abstract concepts, Normal speech and language skills      Neurological Neurological Review of Symptoms: No symptoms reported    HEENT HEENT Symptoms Reported: No symptoms reported      Cardiovascular Cardiovascular Symptoms Reported: Fainting, Lightheadness, Fatigue Does patient have uncontrolled Hypertension?: No Cardiovascular Conditions: Heart failure, Hypertension Cardiovascular Management Strategies: Routine screening, Medication therapy, Diet  modification Weight: (!) 343 lb (155.6 kg) Cardiovascular Comment: patient reports he is doing about the same. He states he continues on Q 2 at 2L PRN during the day and wears at hs. Patient reports occasional LE swelling and states his action plan is to take additional fluid pill for elevated weight and/or symptoms. patient advised to schedule follow up visit with cardiologist.  Patient reports next follow up visit with primary care provider is 03/07/24.  Respiratory Respiratory Symptoms Reported: Shortness of breath Additional Respiratory Details: patient reports ongoing mild SOB and dry cough. Respiratory Conditions: Shortness of breath (OSA) Respiratory Self-Management Outcome: 3 (uncertain) Respiratory Comment: Patient reports next pulmonology visit is 03/08/24.  He states pulmonologist wants him to be seen at the [facility] sleep center for further evaluation.  Patient states he will schedule visit with sleep center.  Endocrine Patient reports the following symptoms related to hypoglycemia or hyperglycemia : No symptoms reported    Gastrointestinal Gastrointestinal Symptoms Reported: No symptoms reported      Genitourinary Genitourinary Symptoms Reported: No symptoms reported    Integumentary Integumentary Symptoms Reported: No symptoms reported    Musculoskeletal Musculoskelatal Symptoms Reviewed: No symptoms reported        Psychosocial Psychosocial Symptoms Reported: No symptoms reported            12/05/2023   11:23 AM  Depression screen PHQ 2/9  Decreased Interest 0  Down, Depressed, Hopeless 0  PHQ - 2 Score 0  Altered sleeping 0  Tired, decreased energy 0  Change in appetite 0  Feeling bad or failure about  yourself  0  Trouble concentrating 0  Moving slowly or fidgety/restless 0  Suicidal thoughts 0  PHQ-9 Score 0  Difficult doing work/chores Not difficult at all    Vitals:   02/06/24 1804  BP: 118/70  SpO2: 91%    Medications Reviewed Today     Reviewed by  Deontae Robson E, RN (Registered Nurse) on 02/06/24 at 1231  Med List Status: <None>   Medication Order Taking? Sig Documenting Provider Last Dose Status Informant  albuterol  (VENTOLIN  HFA) 108 (90 Base) MCG/ACT inhaler [id number] No Inhale 2 puffs into the lungs every 6 (six) hours as needed for wheezing or shortness of breath. Alexander, Natalie, DO Taking Active Self           Med Note Shann Darnel, KYLE A   Wed Sep 14, 2023  2:01 PM)    milrinone  (PRIMACOR ) 20 MG/100 ML SOLN infusion [id number] No Inject 0.0413 mg/min into the vein continuous. Aisha Hove, MD Taking Active Self  Multiple Vitamin (MULTIVITAMIN WITH MINERALS) TABS tablet [id number] No Take 1 tablet by mouth daily. Ezzard Holms, MD Taking Active Self  pantoprazole  (PROTONIX ) 40 MG tablet [id number] No Take 1 tablet (40 mg total) by mouth daily. On an empty stomach 30 minutes prior to eating Thersia Flax, MD Taking Active Self           Med Note Vivian Groom, MELISSA R   Wed Dec 14, 2023  9:25 AM)    Riociguat  (ADEMPAS ) 2.5 MG TABS [id number] No Take 2.5 mg by mouth 3 (three) times daily. Margean Sheehan, RPH-CPP Taking Active Self  tirzepatide  (MOUNJARO ) 7.5 MG/0.5ML Pen [id number] No Inject 7.5 mg into the skin once a week. Thersia Flax, MD Taking Active Self  torsemide  (DEMADEX ) 20 MG tablet [id number] No Take 2 tablets (40 mg total) by mouth daily. Lauralee Poll, MD Taking Active   warfarin (COUMADIN ) 1 MG tablet [id number] No Take with 5 mg on Thursdays and Sundays  Patient taking differently: Take 1 mg by mouth See admin instructions. 5 mg take with 1 mg = 6 mg Tu Th and Sun   Tullo, Teresa L, MD Taking Active Self           Med Note Vivian Groom, MELISSA R   Wed Dec 14, 2023  9:30 AM)    warfarin (COUMADIN ) 5 MG tablet [id number] No Take 1 tablet (5 mg total) by mouth daily.  Patient taking differently: Take 5 mg by mouth See admin instructions. 5 mg Mon, Wed, Fri, Sat and 6 mg Tu Th and Sun   Tullo, Teresa L, MD Taking  Active Self            Recommendation:   PCP Follow-up  Follow Up Plan:   Telephone follow-up in 1 month. Transferring to Michele Ahle, RN for ongoing case management follow up.  Patient agreeable to transfer and ongoing follow up.   Verba Girt RN, BSN, CCM [employer], Population Health Case Manager Phone: [phone]

## 2024-02-06 NOTE — Patient Instructions (Signed)
 Visit Information  Thank you for taking time to visit with me today. Please don't hesitate to contact me if I can be of assistance to you before our next scheduled appointment.  Your next care management appointment is by telephone on 03/09/24 at 3 pm  Telephone follow-up in 1 month with Michele Ahle, RN case manager.   Please call the care guide team at [phone] if you need to cancel, schedule, or reschedule an appointment.   Please call the Suicide and Crisis Lifeline: 988 call 1-800-273-TALK (toll free, 24 hour hotline) if you are experiencing a Mental Health or Behavioral Health Crisis or need someone to talk to.  dermatofibroma

## 2024-02-07 ENCOUNTER — Ambulatory Visit: Payer: Self-pay | Admitting: Internal Medicine

## 2024-02-07 ENCOUNTER — Encounter: Payer: Self-pay | Admitting: Internal Medicine

## 2024-02-10 ENCOUNTER — Other Ambulatory Visit (HOSPITAL_COMMUNITY): Payer: Self-pay | Admitting: Cardiology

## 2024-02-17 ENCOUNTER — Encounter: Payer: Self-pay | Admitting: Emergency Medicine

## 2024-02-17 ENCOUNTER — Inpatient Hospital Stay
Admission: EM | Admit: 2024-02-17 | Discharge: 2024-02-29 | DRG: 286 | Disposition: A | Discharge status: Other Acute Inpatient Hospital | Attending: Internal Medicine | Admitting: Internal Medicine

## 2024-02-17 ENCOUNTER — Emergency Department

## 2024-02-17 ENCOUNTER — Other Ambulatory Visit: Payer: Self-pay

## 2024-02-17 DIAGNOSIS — I480 Paroxysmal atrial fibrillation: Secondary | ICD-10-CM | POA: Diagnosis present

## 2024-02-17 DIAGNOSIS — G4731 Primary central sleep apnea: Secondary | ICD-10-CM

## 2024-02-17 DIAGNOSIS — Y712 Prosthetic and other implants, materials and accessory cardiovascular devices associated with adverse incidents: Secondary | ICD-10-CM | POA: Diagnosis present

## 2024-02-17 DIAGNOSIS — I2724 Chronic thromboembolic pulmonary hypertension: Secondary | ICD-10-CM | POA: Diagnosis present

## 2024-02-17 DIAGNOSIS — N1831 Chronic kidney disease, stage 3a: Secondary | ICD-10-CM | POA: Diagnosis present

## 2024-02-17 DIAGNOSIS — R008 Other abnormalities of heart beat: Secondary | ICD-10-CM | POA: Diagnosis not present

## 2024-02-17 DIAGNOSIS — K760 Fatty (change of) liver, not elsewhere classified: Secondary | ICD-10-CM | POA: Diagnosis present

## 2024-02-17 DIAGNOSIS — Z95828 Presence of other vascular implants and grafts: Secondary | ICD-10-CM

## 2024-02-17 DIAGNOSIS — Y848 Other medical procedures as the cause of abnormal reaction of the patient, or of later complication, without mention of misadventure at the time of the procedure: Secondary | ICD-10-CM | POA: Diagnosis not present

## 2024-02-17 DIAGNOSIS — I130 Hypertensive heart and chronic kidney disease with heart failure and stage 1 through stage 4 chronic kidney disease, or unspecified chronic kidney disease: Secondary | ICD-10-CM | POA: Diagnosis present

## 2024-02-17 DIAGNOSIS — A0472 Enterocolitis due to Clostridium difficile, not specified as recurrent: Secondary | ICD-10-CM | POA: Diagnosis present

## 2024-02-17 DIAGNOSIS — I2781 Cor pulmonale (chronic): Secondary | ICD-10-CM | POA: Diagnosis present

## 2024-02-17 DIAGNOSIS — E785 Hyperlipidemia, unspecified: Secondary | ICD-10-CM | POA: Diagnosis present

## 2024-02-17 DIAGNOSIS — E1122 Type 2 diabetes mellitus with diabetic chronic kidney disease: Secondary | ICD-10-CM | POA: Diagnosis present

## 2024-02-17 DIAGNOSIS — J9691 Respiratory failure, unspecified with hypoxia: Secondary | ICD-10-CM | POA: Diagnosis not present

## 2024-02-17 DIAGNOSIS — I4892 Unspecified atrial flutter: Secondary | ICD-10-CM | POA: Diagnosis present

## 2024-02-17 DIAGNOSIS — Z789 Other specified health status: Secondary | ICD-10-CM | POA: Diagnosis not present

## 2024-02-17 DIAGNOSIS — A044 Other intestinal Escherichia coli infections: Secondary | ICD-10-CM | POA: Diagnosis present

## 2024-02-17 DIAGNOSIS — I5032 Chronic diastolic (congestive) heart failure: Secondary | ICD-10-CM | POA: Diagnosis present

## 2024-02-17 DIAGNOSIS — Z86718 Personal history of other venous thrombosis and embolism: Secondary | ICD-10-CM

## 2024-02-17 DIAGNOSIS — I272 Pulmonary hypertension, unspecified: Secondary | ICD-10-CM | POA: Diagnosis present

## 2024-02-17 DIAGNOSIS — E66813 Obesity, class 3: Secondary | ICD-10-CM | POA: Diagnosis present

## 2024-02-17 DIAGNOSIS — Z7901 Long term (current) use of anticoagulants: Secondary | ICD-10-CM

## 2024-02-17 DIAGNOSIS — Z882 Allergy status to sulfonamides status: Secondary | ICD-10-CM

## 2024-02-17 DIAGNOSIS — I10 Essential (primary) hypertension: Secondary | ICD-10-CM | POA: Diagnosis not present

## 2024-02-17 DIAGNOSIS — T82818A Embolism of vascular prosthetic devices, implants and grafts, initial encounter: Secondary | ICD-10-CM | POA: Diagnosis not present

## 2024-02-17 DIAGNOSIS — G4733 Obstructive sleep apnea (adult) (pediatric): Secondary | ICD-10-CM | POA: Diagnosis not present

## 2024-02-17 DIAGNOSIS — E1169 Type 2 diabetes mellitus with other specified complication: Secondary | ICD-10-CM | POA: Diagnosis not present

## 2024-02-17 DIAGNOSIS — E559 Vitamin D deficiency, unspecified: Secondary | ICD-10-CM | POA: Diagnosis present

## 2024-02-17 DIAGNOSIS — N179 Acute kidney failure, unspecified: Secondary | ICD-10-CM | POA: Diagnosis present

## 2024-02-17 DIAGNOSIS — R791 Abnormal coagulation profile: Secondary | ICD-10-CM | POA: Diagnosis not present

## 2024-02-17 DIAGNOSIS — I5082 Biventricular heart failure: Secondary | ICD-10-CM | POA: Diagnosis present

## 2024-02-17 DIAGNOSIS — Z7985 Long-term (current) use of injectable non-insulin antidiabetic drugs: Secondary | ICD-10-CM

## 2024-02-17 DIAGNOSIS — Z515 Encounter for palliative care: Secondary | ICD-10-CM

## 2024-02-17 DIAGNOSIS — E7212 Methylenetetrahydrofolate reductase deficiency: Secondary | ICD-10-CM | POA: Diagnosis present

## 2024-02-17 DIAGNOSIS — E1151 Type 2 diabetes mellitus with diabetic peripheral angiopathy without gangrene: Secondary | ICD-10-CM | POA: Diagnosis present

## 2024-02-17 DIAGNOSIS — Z7189 Other specified counseling: Secondary | ICD-10-CM | POA: Diagnosis not present

## 2024-02-17 DIAGNOSIS — E872 Acidosis, unspecified: Secondary | ICD-10-CM | POA: Diagnosis present

## 2024-02-17 DIAGNOSIS — I2782 Chronic pulmonary embolism: Secondary | ICD-10-CM | POA: Diagnosis present

## 2024-02-17 DIAGNOSIS — E876 Hypokalemia: Secondary | ICD-10-CM | POA: Diagnosis not present

## 2024-02-17 DIAGNOSIS — E538 Deficiency of other specified B group vitamins: Secondary | ICD-10-CM | POA: Diagnosis present

## 2024-02-17 DIAGNOSIS — I509 Heart failure, unspecified: Secondary | ICD-10-CM | POA: Diagnosis not present

## 2024-02-17 DIAGNOSIS — Z6841 Body Mass Index (BMI) 40.0 and over, adult: Secondary | ICD-10-CM | POA: Diagnosis not present

## 2024-02-17 DIAGNOSIS — E871 Hypo-osmolality and hyponatremia: Secondary | ICD-10-CM | POA: Diagnosis present

## 2024-02-17 DIAGNOSIS — I5084 End stage heart failure: Secondary | ICD-10-CM | POA: Diagnosis present

## 2024-02-17 DIAGNOSIS — J9621 Acute and chronic respiratory failure with hypoxia: Secondary | ICD-10-CM | POA: Diagnosis present

## 2024-02-17 DIAGNOSIS — J9611 Chronic respiratory failure with hypoxia: Secondary | ICD-10-CM

## 2024-02-17 DIAGNOSIS — I50810 Right heart failure, unspecified: Secondary | ICD-10-CM | POA: Diagnosis not present

## 2024-02-17 DIAGNOSIS — N183 Chronic kidney disease, stage 3 unspecified: Secondary | ICD-10-CM | POA: Diagnosis present

## 2024-02-17 DIAGNOSIS — Z8249 Family history of ischemic heart disease and other diseases of the circulatory system: Secondary | ICD-10-CM

## 2024-02-17 DIAGNOSIS — Z9981 Dependence on supplemental oxygen: Secondary | ICD-10-CM

## 2024-02-17 DIAGNOSIS — T829XXA Unspecified complication of cardiac and vascular prosthetic device, implant and graft, initial encounter: Secondary | ICD-10-CM | POA: Insufficient documentation

## 2024-02-17 DIAGNOSIS — E611 Iron deficiency: Secondary | ICD-10-CM | POA: Diagnosis present

## 2024-02-17 DIAGNOSIS — Z79899 Other long term (current) drug therapy: Secondary | ICD-10-CM

## 2024-02-17 DIAGNOSIS — T82524A Displacement of infusion catheter, initial encounter: Secondary | ICD-10-CM | POA: Diagnosis present

## 2024-02-17 DIAGNOSIS — R509 Fever, unspecified: Secondary | ICD-10-CM | POA: Diagnosis not present

## 2024-02-17 DIAGNOSIS — J9601 Acute respiratory failure with hypoxia: Secondary | ICD-10-CM | POA: Diagnosis not present

## 2024-02-17 LAB — CBC WITH DIFFERENTIAL/PLATELET
Abs Immature Granulocytes: 0.02 10*3/uL (ref 0.00–0.07)
Basophils Absolute: 0 10*3/uL (ref 0.0–0.1)
Basophils Relative: 0 %
Eosinophils Absolute: 0 10*3/uL (ref 0.0–0.5)
Eosinophils Relative: 1 %
HCT: 41.4 % (ref 39.0–52.0)
Hemoglobin: 13 g/dL (ref 13.0–17.0)
Immature Granulocytes: 0 %
Lymphocytes Relative: 14 %
Lymphs Abs: 0.9 10*3/uL (ref 0.7–4.0)
MCH: 26.7 pg (ref 26.0–34.0)
MCHC: 31.4 g/dL (ref 30.0–36.0)
MCV: 85 fL (ref 80.0–100.0)
Monocytes Absolute: 0.6 10*3/uL (ref 0.1–1.0)
Monocytes Relative: 9 %
Neutro Abs: 4.9 10*3/uL (ref 1.7–7.7)
Neutrophils Relative %: 76 %
Platelets: 187 10*3/uL (ref 150–400)
RBC: 4.87 MIL/uL (ref 4.22–5.81)
RDW: 16.1 % — ABNORMAL HIGH (ref 11.5–15.5)
WBC: 6.5 10*3/uL (ref 4.0–10.5)
nRBC: 0 % (ref 0.0–0.2)

## 2024-02-17 LAB — COMPREHENSIVE METABOLIC PANEL WITH GFR
ALT: 16 U/L (ref 0–44)
AST: 27 U/L (ref 15–41)
Albumin: 3.5 g/dL (ref 3.5–5.0)
Alkaline Phosphatase: 45 U/L (ref 38–126)
Anion gap: 9 (ref 5–15)
BUN: 16 mg/dL (ref 6–20)
CO2: 22 mmol/L (ref 22–32)
Calcium: 8.5 mg/dL — ABNORMAL LOW (ref 8.9–10.3)
Chloride: 101 mmol/L (ref 98–111)
Creatinine, Ser: 2 mg/dL — ABNORMAL HIGH (ref 0.61–1.24)
GFR, Estimated: 39 mL/min — ABNORMAL LOW (ref >60)
Glucose, Bld: 115 mg/dL — ABNORMAL HIGH (ref 70–99)
Potassium: 4 mmol/L (ref 3.5–5.1)
Sodium: 132 mmol/L — ABNORMAL LOW (ref 135–145)
Total Bilirubin: 1.1 mg/dL (ref 0.0–1.2)
Total Protein: 8 g/dL (ref 6.5–8.1)

## 2024-02-17 LAB — RESP PANEL BY RT-PCR (RSV, FLU A&B, COVID)  RVPGX2
Influenza A by PCR: NEGATIVE
Influenza B by PCR: NEGATIVE
Resp Syncytial Virus by PCR: NEGATIVE
SARS Coronavirus 2 by RT PCR: NEGATIVE

## 2024-02-17 LAB — LACTIC ACID, PLASMA
Lactic Acid, Venous: 1.5 mmol/L (ref 0.5–1.9)
Lactic Acid, Venous: 1.8 mmol/L (ref 0.5–1.9)
Lactic Acid, Venous: 2 mmol/L (ref 0.5–1.9)
Lactic Acid, Venous: 1.1 mmol/L (ref 0.5–1.9)

## 2024-02-17 LAB — BLOOD GAS, VENOUS
Acid-base deficit: 0.3 mmol/L (ref 0.0–2.0)
Bicarbonate: 24.1 mmol/L (ref 20.0–28.0)
O2 Saturation: 34.3 %
Patient temperature: 37
pCO2, Ven: 38 mmHg — ABNORMAL LOW (ref 44–60)

## 2024-02-17 LAB — PROTIME-INR
INR: 1.6 — ABNORMAL HIGH (ref 0.8–1.2)
Prothrombin Time: 19 s — ABNORMAL HIGH (ref 11.4–15.2)

## 2024-02-17 LAB — PROCALCITONIN: Procalcitonin: 0.68 ng/mL

## 2024-02-17 LAB — GLUCOSE, CAPILLARY: Glucose-Capillary: 107 mg/dL — ABNORMAL HIGH (ref 70–99)

## 2024-02-17 LAB — BRAIN NATRIURETIC PEPTIDE: B Natriuretic Peptide: 424.2 pg/mL — ABNORMAL HIGH (ref 0.0–100.0)

## 2024-02-17 LAB — TROPONIN I (HIGH SENSITIVITY)
Troponin I (High Sensitivity): 123 ng/L (ref <18)
Troponin I (High Sensitivity): 142 ng/L (ref <18)

## 2024-02-17 MED ORDER — VANCOMYCIN HCL IN DEXTROSE 1-5 GM/200ML-% IV SOLN
1000.0000 mg | Freq: Once | INTRAVENOUS | Status: AC
  Administered 2024-02-17: 1000 mg via INTRAVENOUS
  Filled 2024-02-17 (×2): qty 200

## 2024-02-17 MED ORDER — SODIUM CHLORIDE 0.9 % IV SOLN
500.0000 mg | Freq: Once | INTRAVENOUS | Status: AC
  Administered 2024-02-17: 500 mg via INTRAVENOUS
  Filled 2024-02-17: qty 5

## 2024-02-17 MED ORDER — SODIUM CHLORIDE 0.9 % IV SOLN
2.0000 g | Freq: Once | INTRAVENOUS | Status: AC
  Administered 2024-02-17: 2 g via INTRAVENOUS
  Filled 2024-02-17: qty 20

## 2024-02-17 MED ORDER — INSULIN ASPART 100 UNIT/ML IJ SOLN: 0.0000 [IU] | Freq: Every day | INTRAMUSCULAR | Status: DC

## 2024-02-17 MED ORDER — ADULT MULTIVITAMIN W/MINERALS CH
1.0000 | ORAL_TABLET | Freq: Every day | ORAL | Status: DC
  Administered 2024-02-17 – 2024-02-29 (×13): 1 via ORAL
  Filled 2024-02-17 (×14): qty 1

## 2024-02-17 MED ORDER — RIOCIGUAT 2.5 MG PO TABS
2.5000 mg | ORAL_TABLET | Freq: Three times a day (TID) | ORAL | Status: DC
  Administered 2024-02-19 – 2024-02-29 (×31): 2.5 mg via ORAL
  Filled 2024-02-17 (×31): qty 1

## 2024-02-17 MED ORDER — ALBUTEROL SULFATE (2.5 MG/3ML) 0.083% IN NEBU
3.0000 mL | INHALATION_SOLUTION | Freq: Four times a day (QID) | RESPIRATORY_TRACT | Status: DC | PRN
  Administered 2024-02-29 (×2): 3 mL via RESPIRATORY_TRACT
  Filled 2024-02-17 (×2): qty 3

## 2024-02-17 MED ORDER — ONDANSETRON HCL 4 MG PO TABS: 4.0000 mg | ORAL_TABLET | Freq: Four times a day (QID) | ORAL | Status: DC | PRN

## 2024-02-17 MED ORDER — IOHEXOL 350 MG/ML SOLN
60.0000 mL | Freq: Once | INTRAVENOUS | Status: AC | PRN
  Administered 2024-02-17: 60 mL via INTRAVENOUS

## 2024-02-17 MED ORDER — MILRINONE LACTATE IN DEXTROSE 20-5 MG/100ML-% IV SOLN
0.2500 ug/kg/min | INTRAVENOUS | Status: DC
  Filled 2024-02-17: qty 100

## 2024-02-17 MED ORDER — ACETAMINOPHEN 650 MG RE SUPP: 650.0000 mg | Freq: Four times a day (QID) | RECTAL | Status: DC | PRN

## 2024-02-17 MED ORDER — FUROSEMIDE 10 MG/ML IJ SOLN
80.0000 mg | Freq: Once | INTRAMUSCULAR | Status: AC
  Administered 2024-02-17: 80 mg via INTRAVENOUS
  Filled 2024-02-17: qty 8

## 2024-02-17 MED ORDER — SODIUM CHLORIDE 0.9 % IV SOLN: 2.0000 g | Freq: Three times a day (TID) | INTRAVENOUS | Status: DC

## 2024-02-17 MED ORDER — SODIUM CHLORIDE 0.9 % IV SOLN
2.0000 g | Freq: Three times a day (TID) | INTRAVENOUS | Status: DC
  Administered 2024-02-17 – 2024-02-18 (×4): 2 g via INTRAVENOUS
  Filled 2024-02-17 (×4): qty 12.5

## 2024-02-17 MED ORDER — ACETAMINOPHEN 325 MG PO TABS
650.0000 mg | ORAL_TABLET | Freq: Four times a day (QID) | ORAL | Status: DC | PRN
  Administered 2024-02-27 – 2024-02-29 (×3): 650 mg via ORAL
  Filled 2024-02-17 (×3): qty 2

## 2024-02-17 MED ORDER — OXYCODONE HCL 5 MG PO TABS: 5.0000 mg | ORAL_TABLET | ORAL | Status: DC | PRN

## 2024-02-17 MED ORDER — WARFARIN SODIUM 4 MG PO TABS
8.0000 mg | ORAL_TABLET | Freq: Once | ORAL | Status: AC
  Administered 2024-02-17: 8 mg via ORAL
  Filled 2024-02-17: qty 2

## 2024-02-17 MED ORDER — ENOXAPARIN SODIUM 150 MG/ML IJ SOSY
150.0000 mg | PREFILLED_SYRINGE | Freq: Two times a day (BID) | INTRAMUSCULAR | Status: AC
  Administered 2024-02-17 – 2024-02-20 (×7): 150 mg via SUBCUTANEOUS
  Filled 2024-02-17 (×7): qty 1

## 2024-02-17 MED ORDER — ONDANSETRON HCL 4 MG/2ML IJ SOLN
4.0000 mg | Freq: Four times a day (QID) | INTRAMUSCULAR | Status: DC | PRN
  Administered 2024-02-18: 4 mg via INTRAVENOUS
  Filled 2024-02-17: qty 2

## 2024-02-17 MED ORDER — FUROSEMIDE 10 MG/ML IJ SOLN: 40.0000 mg | Freq: Once | INTRAMUSCULAR | Status: DC

## 2024-02-17 MED ORDER — PANTOPRAZOLE SODIUM 40 MG PO TBEC
40.0000 mg | DELAYED_RELEASE_TABLET | Freq: Every day | ORAL | Status: DC
  Administered 2024-02-17 – 2024-02-29 (×13): 40 mg via ORAL
  Filled 2024-02-17 (×14): qty 1

## 2024-02-17 MED ORDER — WARFARIN - PHARMACIST DOSING INPATIENT
Freq: Every day | Status: DC
  Filled 2024-02-17: qty 1

## 2024-02-17 MED ORDER — FUROSEMIDE 10 MG/ML IJ SOLN
80.0000 mg | Freq: Two times a day (BID) | INTRAMUSCULAR | Status: DC
  Administered 2024-02-18 – 2024-02-20 (×6): 80 mg via INTRAVENOUS
  Filled 2024-02-17 (×7): qty 8

## 2024-02-17 MED ORDER — CHLORHEXIDINE GLUCONATE CLOTH 2 % EX PADS
6.0000 | MEDICATED_PAD | Freq: Every day | CUTANEOUS | Status: DC
  Administered 2024-02-17 – 2024-02-29 (×13): 6 via TOPICAL

## 2024-02-17 MED ORDER — VANCOMYCIN HCL 1500 MG/300ML IV SOLN
1500.0000 mg | Freq: Once | INTRAVENOUS | Status: AC
  Administered 2024-02-17: 1500 mg via INTRAVENOUS
  Filled 2024-02-17: qty 300

## 2024-02-17 MED ORDER — MILRINONE LACTATE IN DEXTROSE 20-5 MG/100ML-% IV SOLN
0.1250 ug/kg/min | INTRAVENOUS | Status: DC
  Administered 2024-02-17 – 2024-02-21 (×16): 0.375 ug/kg/min via INTRAVENOUS
  Administered 2024-02-21: 0.25 ug/kg/min via INTRAVENOUS
  Administered 2024-02-21: 0.375 ug/kg/min via INTRAVENOUS
  Administered 2024-02-22 – 2024-02-28 (×18): 0.25 ug/kg/min via INTRAVENOUS
  Administered 2024-02-28: 0.125 ug/kg/min via INTRAVENOUS
  Administered 2024-02-28: 0.25 ug/kg/min via INTRAVENOUS
  Administered 2024-02-29: 0.125 ug/kg/min via INTRAVENOUS
  Filled 2024-02-17 (×36): qty 100

## 2024-02-17 MED ORDER — INSULIN ASPART 100 UNIT/ML IJ SOLN
0.0000 [IU] | Freq: Three times a day (TID) | INTRAMUSCULAR | Status: DC
  Administered 2024-02-18 – 2024-02-29 (×22): 1 [IU] via SUBCUTANEOUS
  Filled 2024-02-17 (×20): qty 1

## 2024-02-17 NOTE — Hospital Course (Addendum)
 53 y.o. M with CTEPH and pHTN on milrinone , dCHF, chronic respiratory failure on 4L, HTN, DM, CKD IIIa baseline 1.4, hx IVC filter, on warfarin due to Eliquis /Xarelto  failures, and MO who presented with cough, worsening SOB now at rest and syncope.  Evidently started to feel generalized malaise 4-5 days PTA.  Had vomiting, unable to eat and 1-2 loose watery stools per day.  Got weaker and weaker until today, he went to drive to his aunts house to meet his milrinone  infusion HHRN but when he got there, he walked in the house and passed out in a chair, so she called 9-1-1.  EMS found that he had a fever, required 10L O2 to keep sats >90%.  In the ER, CXR showed malpositioned CVC but no infiltrates.  CTA chest showed chronic PE, no new findings, no pulmonary edema.  INR subtherapeutic.  Cr up to 2.  Lactate normal.  No fever here.  Given empiric antibiotics, IV Lasix .  Case discussed with advanced HF team Dr. Gardenia who stated they would see the patient.  Wife gave collateral that patient now lives alone, eats only takeout food and she has concerns that his memory is sometimes impaired and she isn't sure he is taking his medicin

## 2024-02-17 NOTE — ED Triage Notes (Signed)
 Pt presents via EMS from home for shortness of breath on 8L [place].  Pt is A&OX4.  Pt endorses cough.

## 2024-02-17 NOTE — Progress Notes (Signed)
 Patient ordered on CPAP at night. Patient stated has never worn a cpap at home and doesn't think he needs one here.

## 2024-02-17 NOTE — Progress Notes (Incomplete)
 Discussed central line with IV team, Dr. Bruce Caper, IR Dr. Marlena Sima.  IR will not be available at [facility] until Monday.  In the meantime, we will continue milrinone  through home

## 2024-02-17 NOTE — Consult Note (Addendum)
 Pharmacy Consult Note - Anticoagulation  Pharmacy Consult for warfarin Indication: CTEPH and atrial fibrillation  PATIENT MEASUREMENTS: Height: 5' 7 (170.2 cm) Weight: (!) 155.6 kg (343 lb) IBW/kg (Calculated) : 66.1 HEPARIN  DW (KG): 104.5  VITAL SIGNS: Temp: 99.8 F (37.7 C) (06/20 1046) Temp Source: Oral (06/20 1046) BP: 113/70 (06/20 1430) Pulse Rate: 103 (06/20 1430)  Recent Labs    02/17/24 1037 02/17/24 1222  HGB 13.0  --   HCT 41.4  --   PLT 187  --   LABPROT 19.0*  --   INR 1.6*  --   CREATININE 2.00*  --   TROPONINIHS 123* 142*    Estimated Creatinine Clearance: 60.9 mL/min (A) (by C-G formula based on SCr of 2 mg/dL (H)).  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  Regular the workup has not been forthcoming) previously followed by Dr. Loralie Rocher at [facility]   Hepatic steatosis    Hypertension    Morbid obesity with BMI of 50.0-59.9, adult (HCC)    Oxygen deficiency    Peripheral vascular disease (HCC)    Presence of IVC filter    Has 2 IVC filters with significant thrombus burden superior to the filter.   Pulmonary embolus (HCC) 12/2016   Initially just treated with anticoagulation, but in February 2023 in April 2024 treated with EKOS thrombectomy for submassive PE.   Ventricular trigeminy    Weakness of both lower limbs     ASSESSMENT: 53 y.o. 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 Patient is on chronic anticoagulation per chart  review. Has been on Xarelto  in the past but is currently on warfarin with INR goal 2.5-3.5. Pharmacy has been consulted to manage warfarin while patient is admitted. INR currently subtherapeutic; no known missed doses however, per wife, medication adherence overall is question. Patient is receiving enoxaparin  until INR is therapeutic x2.  Pertinent medications: Warfarin 6mg  on Tuesday, Thursday, Sunday Warfarin 5mg  all other days Has 5mg  and 1mg  tablets at home Last dose 6/19 at bedtime Total weekly warfarin = 38mg   Goal(s) of therapy: INR 2.5 - 3.5 Monitor platelets by anticoagulation protocol: Yes   Baseline anticoagulation labs: Recent Labs    02/17/24 1037  INR 1.6*  HGB 13.0  PLT 187      PLAN: Administer warfarin 8mg  PO x 1 today (home regimen normally 5mg  on Fridays) Assuming no other changes, this results in ~8% more warfarin this week Continue enoxaparin  1mg /kg q12H until INR is therapeutic x 2 CBC daily  Will M. Alva Jewels, PharmD Clinical Pharmacist 02/17/2024 3:04 PM

## 2024-02-17 NOTE — Consult Note (Signed)
 Pharmacy Antibiotic Note  Ryan [name] is a 53 y.o. male admitted on 02/17/2024 with shortness of breath.  Pharmacy has been consulted for Vancomycin  dosing.  Plan: Vancomycin  2500mg  IV x 1 as loading dose Will dose via random levels until renal function stabilizes   Height: 5' 7 (170.2 cm) Weight: (!) 155.6 kg (343 lb) IBW/kg (Calculated) : 66.1  Temp (24hrs), Avg:99.8 F (37.7 C), Min:99.8 F (37.7 C), Max:99.8 F (37.7 C)  Recent Labs  Lab 02/17/24 1037  WBC 6.5  CREATININE 2.00*  LATICACIDVEN 1.5  1.8    Estimated Creatinine Clearance: 60.9 mL/min (A) (by C-G formula based on SCr of 2 mg/dL (H)).    Allergies  Allergen Reactions   Jardiance  [Empagliflozin ]     Caused infection    Metrizamide Other (See Comments)    Kidney failure requiring dialysis    Clindamycin/Lincomycin Rash   Lincomycin Rash   Sulfa Antibiotics Rash    Antimicrobials this admission: Cefepime  6/20 >>  Vancomycin  6/20 >>  Ceftriaxone /Azithromycin  6/20 x 1  Dose adjustments this admission: N/A  Microbiology results: 6/20 BCx: collected   Thank you for allowing pharmacy to be a part of this patient's care.  [name] [name] 02/17/2024 4:13 PM

## 2024-02-17 NOTE — ED Notes (Signed)
 Lab here to draw blood, unsuccessful, says they will send someone else when pt is taken to floor.

## 2024-02-17 NOTE — Consult Note (Signed)
 CODE SEPSIS - PHARMACY COMMUNICATION  **Broad Spectrum Antibiotics should be administered within 1 hour of Sepsis diagnosis**  Time Code Sepsis Called/Page Received: 1151  Antibiotics Ordered: azithromycin , ceftriaxone   Time of 1st antibiotic administration: 1240  Additional action taken by pharmacy: n/a  If necessary, Name of Provider/Nurse Contacted: n/a    Auna Mikkelsen A Milayna Rotenberg ,PharmD Clinical Pharmacist  02/17/2024  12:07 PM

## 2024-02-17 NOTE — H&P (Addendum)
 History and Physical    Patient: Ryan [name] AOZ:[id number] DOB: 09/07/1970 DOA: 02/17/2024 DOS: the patient was seen and examined on 02/17/2024 PCP: Thersia Flax, MD  Patient coming from: Home  Chief Complaint:  Chief Complaint  Patient presents with   Shortness of Breath       HPI:  53 y.o. M with CTEPH and pHTN on milrinone , dCHF, chronic respiratory failure on 4L, HTN, DM, CKD IIIa baseline 1.4, hx IVC filter, on warfarin due to Eliquis /Xarelto  failures, and MO who presented with cough, worsening SOB now at rest and syncope.  Evidently started to feel generalized malaise 4-5 days PTA.  Had vomiting, unable to eat and 1-2 loose watery stools per day.  Got weaker and weaker until today, he went to drive to his aunts house to meet his milrinone  infusion HHRN but when he got there, he walked in the house and passed out in a chair, so she called 9-1-1.  EMS found that he had a fever, required 10L O2 to keep sats >90%.  In the ER, CXR showed malpositioned CVC but no infiltrates.  CTA chest showed chronic PE, no new findings, no pulmonary edema.  INR subtherapeutic.  Cr up to 2.  Lactate normal.  No fever here.  Given empiric antibiotics, IV Lasix .  Case discussed with advanced HF team Dr. Bruce Caper who stated they would see the patient.  Wife gave collateral that patient now lives alone, eats only takeout food and she has concerns that his memory is sometimes impaired and she isn't sure he is taking his medicines.      Review of Systems  Constitutional:  Positive for malaise/fatigue. Negative for chills and fever.  Respiratory:  Positive for cough and shortness of breath. Negative for hemoptysis, sputum production and wheezing.   Cardiovascular:  Negative for chest pain, palpitations, orthopnea and leg swelling.  Gastrointestinal:  Positive for diarrhea, nausea and vomiting. Negative for abdominal pain, blood in stool, constipation and melena.  Neurological:  Positive for  dizziness, loss of consciousness and weakness. Negative for focal weakness.  All other systems reviewed and are negative.    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  Regular the workup has not been forthcoming) previously followed by Dr. Loralie Rocher at [facility]   Hepatic steatosis    Hypertension    Morbid obesity with BMI of 50.0-59.9, adult (HCC)    Oxygen deficiency    Peripheral vascular disease (HCC)    Presence of IVC filter    Has 2 IVC filters with significant thrombus burden superior to the filter.   Pulmonary embolus (HCC) 12/2016   Initially just treated with anticoagulation, but in February 2023 in April 2024 treated with EKOS thrombectomy for submassive PE.   Ventricular trigeminy    Weakness of both lower limbs    Past Surgical History:  Procedure Laterality Date   CARDIAC ELECTROPHYSIOLOGY STUDY AND ABLATION  10/2020   CARDIOVERSION N/A 06/21/2023   Procedure: CARDIOVERSION;  Surgeon: Alwin Baars, DO;  Location: [facility] ORS;  Service: Cardiovascular;  Laterality: N/A;   IR  FLUORO GUIDE CV LINE RIGHT  09/22/2023   PERIPHERAL VASCULAR CATHETERIZATION N/A 01/10/2015   Procedure: Dialysis/Perma Catheter Insertion;  Surgeon: Jackquelyn Mass, MD;  Location: [facility] INVASIVE CV LAB;  Service: Cardiovascular;  Laterality: N/A;   PERIPHERAL VASCULAR CATHETERIZATION N/A 02/24/2015   Procedure: Dialysis/Perma Catheter Removal;  Surgeon: Celso College, MD;  Location: [facility] INVASIVE CV LAB;  Service: Cardiovascular;  Laterality: N/A;   PULMONARY ANGIOGRAPHY  12/16/2023   Procedure: PULMONARY ANGIOGRAPHY;   Surgeon: Lauralee Poll, MD;  Location: [facility] INVASIVE CV LAB;  Service: Cardiovascular;;   PULMONARY THROMBECTOMY N/A 12/13/2022   Procedure: PULMONARY THROMBECTOMY;  Surgeon: Celso College, MD;  Location: [facility] INVASIVE CV LAB;  Service: Cardiovascular;  Laterality: N/A;   PULMONARY THROMBECTOMY N/A 05/17/2023   Procedure: PULMONARY THROMBECTOMY;  Surgeon: Jackquelyn Mass, MD;  Location: [facility] INVASIVE CV LAB;  Service: Cardiovascular;  Laterality: N/A;   RIGHT HEART CATH Right 09/20/2023   Procedure: RIGHT HEART CATH;  Surgeon: Lauralee Poll, MD;  Location: [facility] INVASIVE CV LAB;  Service: Cardiovascular;  Laterality: Right;   RIGHT HEART CATH N/A 12/16/2023   Procedure: RIGHT HEART CATH;  Surgeon: Lauralee Poll, MD;  Location: [facility] INVASIVE CV LAB;  Service: Cardiovascular;  Laterality: N/A;   TEE WITHOUT CARDIOVERSION N/A 06/21/2023   Procedure: TRANSESOPHAGEAL ECHOCARDIOGRAM (TEE);  Surgeon: Alwin Baars, DO;  Location: [facility] ORS;  Service: Cardiovascular;  Laterality: N/A;   Social History:  reports that he has never smoked. He has never used smokeless tobacco. He reports that he does not drink alcohol and does not use drugs.  Allergies  Allergen Reactions   Jardiance  [Empagliflozin ]     Caused infection    Metrizamide Other (See Comments)    Kidney failure requiring dialysis    Clindamycin/Lincomycin Rash   Lincomycin Rash   Sulfa Antibiotics Rash    Family History  Problem Relation Age of Onset   Hypertension Mother    Cancer Mother    Breast cancer Mother    Prostate cancer Father    Hypertension Father    Diabetes Father    Heart disease Maternal Grandmother    Pulmonary embolism Paternal Grandfather    Pulmonary embolism Paternal Uncle    Pulmonary embolism Cousin    Deep vein thrombosis Cousin    Pulmonary embolism Paternal Aunt     Prior to Admission medications   Medication Sig Start Date End Date Taking? Authorizing Provider  albuterol  (VENTOLIN   HFA) 108 (90 Base) MCG/ACT inhaler Inhale 2 puffs into the lungs every 6 (six) hours as needed for wheezing or shortness of breath. 02/23/22   [name], DO  milrinone  (PRIMACOR ) 20 MG/100 ML SOLN infusion Inject 0.0413 mg/min into the vein continuous. 09/30/23   Aisha Hove, MD  Multiple Vitamin (MULTIVITAMIN WITH MINERALS) TABS tablet Take 1 tablet by mouth daily. 06/24/23   Ezzard Holms, MD  pantoprazole  (PROTONIX ) 40 MG tablet Take 1 tablet (40 mg total) by mouth daily. On an empty stomach 30 minutes prior to eating 10/14/23   Thersia Flax, MD  Riociguat  (ADEMPAS ) 2.5 MG TABS Take 2.5 mg by mouth 3 (three) times daily. 11/23/23   Margean Sheehan, RPH-CPP  tirzepatide  (MOUNJARO ) 7.5 MG/0.5ML Pen Inject 7.5 mg into the skin once a week. 12/05/23   Thersia Flax, MD  torsemide  (DEMADEX ) 20 MG tablet Take 2 tablets (40 mg total) by mouth daily. 12/16/23   Lauralee Poll, MD  warfarin (COUMADIN ) 1 MG tablet Take with  5 mg on Thursdays and Sundays Patient taking differently: Take 1 mg by mouth See admin instructions. 5 mg take with 1 mg = 6 mg Tu Th and Sun 10/21/23   Tullo, Teresa L, MD  warfarin (COUMADIN ) 5 MG tablet Take 1 tablet (5 mg total) by mouth daily. Patient taking differently: Take 5 mg by mouth See admin instructions. 5 mg Mon, Wed, Fri, Sat and 6 mg Tu Th and Sun 10/14/23   Thersia Flax, MD    Physical Exam: Vitals:   02/17/24 1145 02/17/24 1300 02/17/24 1400 02/17/24 1430  BP:  134/80 118/74 113/70  Pulse: 99 97 (!) 102 (!) 103  Resp:      Temp:      TempSrc:      SpO2: 98% 98% 96% 95%  Weight:      Height:       General appearance: Obese adult male, tired, appears short of breath, lying in bed, sluggish but responsive to questions and A&Ox4 on my evaluation  HEENT:  Anicteric, conjunctivae and sclerae normal without injection or icterus, lids and lashes normal.  Visual tracking smooth.  OP moist.  The tongue has geographic ulcers. No lesions on  oropharynx.  Making persistent movements of the lips and tongue. dentition normal, lips normal, normal auditory acuity   Cor: Tachycardic regular, prominent S2, but no mrumrus I can here.  JVP not visible.  Scattered varicose veins on arms, legs, abdomen but JVP not visible.  Brawny change to legs, prescludes pitting assessment.    Resp: Tachypneic, appears out of breathe.  Lungs sounds distant, no rales or wheezing appreciate but exam limited  Abd:  No TTP or rebound all quadrants.  No masses or organomegaly.   Ascites not visible due to huabitus.  Striae noted, old.   Neuro:  Speech is fluent.  Naming is grossly intact, patient's recall, both recent and remote, seem within normal limits.  Muscle tone normal reduced but symmetric   Psych:  Attention span and concentration are within normal limits limited by SOB and fatigue. Affect blunted.   appropriate thought content and normal rate of speech, thought process linear           Data Reviewed: Discussed with ER doctor and cardiology Basic metabolic panel shows hyponatremia, AKI, potassium, bicarb normal, LFTs normal BNP 424 Troponin low and flat Lactic acid normal CBC unremarkable INR subtherapeutic at 1.6 COVID and flu negative ECG personally reviewed, shows sinus tachycardia, no ST changes Chest x-ray, personally reviewed, hyperattenuated due to obesity, no clear infiltrates, pulmonary arteries very enlarged Radiology note mal positioning of CVC CT head report reviewed, no acute changes CT angiogram of the chest, report reviewed, shows chronic PE, no change from previous, no edema or effusions or pneumonia. 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  At baseline is on O2 as needed at home, and with sleeping.  Here, is hypoxic <90% and requires 10L to keep above 90% - Consult advanced heart failure team - Continue milrinone  - Continue IV Lasix , defer dosing to cardiology - Continue Adempa  CTA rules out fresh clot, I would favor holding bridging AC, defer to Caridology - Resume warfarin   Acute kidney injury on CKD stage IIIa Based on our assessment  to this point, suspect this is congestive Cr baseline 1.4, currently up to 2 on admission - Diuresis - Trend BMP  Fever, reported Not observed here but reported by EMS.  Given his cnetral line, and procal elevation, will cover with empiric antibiotics and monitor procal. - Continue vancomycin  and cefepime  - If cultures of blood negative at 48 hours, will stop abx  Nausea and vomiting Unclear if this is simply side effects of his worsening cor pulmonale, or if this represents a separate issue - Ondansetron  and monitor  Paroxysmal atrial fibrillation Currently in sinus rhythm - Continue warfarin  Essential hypertension Chronic diastolic left heart failure Hyperlipidemia - Hold home torsemide  - Diruesies as above  Type 2 diabetes A1c <7% recently.  Diet controlled - SS correction insulin   Hyponatremia Mild - Diuresis as above and trend  Morbid obesity Class III, BMI 53, complicates care - Hold Mounjaro   Sleep apnea - CPAP   Advance Care Planning: FULL code confirmed with patient   Consults: Cardiology, Dr. Bruce Caper  Family Communication: None  Severity of Illness: The appropriate patient status for this patient is INPATIENT. Inpatient status is judged to be reasonable and necessary in order to provide the required intensity of service to ensure the patient's safety. The patient's presenting symptoms, physical exam findings, and initial radiographic and laboratory data in the context of their chronic comorbidities is felt to place them at high risk  for further clinical deterioration. Furthermore, it is not anticipated that the patient will be medically stable for discharge from the hospital within 2 midnights of admission.   * I certify that at the point of admission it is my clinical judgment that the patient will require inpatient hospital care spanning beyond 2 midnights from the point of admission due to high intensity of service, high risk for further deterioration and high frequency of surveillance required.*  Author: Ephriam Hashimoto, MD 02/17/2024 2:56 PM  For on call review [email or web address].

## 2024-02-17 NOTE — Sepsis Progress Note (Signed)
 eLink is following this Code Sepsis.

## 2024-02-17 NOTE — Progress Notes (Signed)
 [facility]AST consulted to assess patient's PICC line. Upon arrival to patient's bedside, noted that patient did not have a PICC, but a tunneled DL CVC to right chest infusing milrinone  via patient's home pump; corrected documentation in IV flowsheets. Per chest x-ray, CVC in right brachiocephalic vein. VAST RN noted dressing due to be changed. Upon removal of old dressing cuff was noted to be exposed and line was out at 5cm mark; insertion site stitches were not intact, but wing stitches remained loosely intact. ER RN and Darlyn Eke, MD notified.  PICC ordered.  Per Dr. Darlyn Eke, Keep the milrinone  going through his tunneled line tonight, they can change it tomorrow when the PICC is placed.

## 2024-02-17 NOTE — ED Provider Notes (Signed)
 [facility] Provider Note    Event Date/Time   First MD Initiated Contact with Patient 02/17/24 1033     (approximate)   History   Chief Complaint Shortness of Breath   HPI  Ryan [name] is a 53 y.o. male with past medical history of hypertension, diabetes, DVT/PE on Coumadin , IVC filter, CKD, and chronic thromboembolic pulmonary hypertension who presents to the ED complaining of shortness of breath.  Patient reports that he typically gets out of breath with exertion but has had increasing difficulty breathing at rest over the past 24 hours.  He denies any fevers or cough, has not had any pain in his chest.  He does report increased swelling in both legs compared to baseline, is currently receiving continuous milrinone  infusion and wears 4L supplemental oxygen at home.  Speaking with patient's wife over the phone, who no longer lives with the patient, she is concerned that he had an episode 3 days ago where he lost all recollection of his entire day.  She states that he is unable to care for himself at home, typically stays in a recliner and orders takeout food.  She is also concerned that he has not been taking his medications as prescribed.  Patient noted to be febrile to 101 by EMS.      Physical Exam   Triage Vital Signs: ED Triage Vitals [02/17/24 1029]  Encounter Vitals Group     BP      Girls Systolic BP Percentile      Girls Diastolic BP Percentile      Boys Systolic BP Percentile      Boys Diastolic BP Percentile      Pulse      Resp      Temp      Temp src      SpO2      Weight (!) 343 lb (155.6 kg)     Height 5' 7 (1.702 m)     Head Circumference      Peak Flow      Pain Score      Pain Loc      Pain Education      Exclude from Growth Chart     Most recent vital signs: Vitals:   02/17/24 1130 02/17/24 1145  BP:    Pulse: (!) 102 99  Resp:    Temp:    SpO2: 97% 98%    Constitutional: Alert and oriented. Eyes: Conjunctivae  are normal. Head: Atraumatic. Nose: No congestion/rhinnorhea. Mouth/Throat: Mucous membranes are moist.  Cardiovascular: Tachycardic, regular rhythm. Grossly normal heart sounds.  2+ radial pulses bilaterally. Respiratory: Tachypneic with increased work of breathing, crackles noted to bilateral bases.  Right chest tunneled central line with milrinone  infusion, no surrounding erythema, warmth, or drainage noted. Gastrointestinal: Soft and nontender. No distention. Musculoskeletal: No lower extremity tenderness, 2+ pitting edema to thighs bilaterally. Neurologic:  Normal speech and language. No gross focal neurologic deficits are appreciated.    ED Results / Procedures / Treatments   Labs (all labs ordered are listed, but only abnormal results are displayed) Labs Reviewed  CBC WITH DIFFERENTIAL/PLATELET - Abnormal; Notable for the following components:      Result Value   RDW 16.1 (*)    All other components within normal limits  COMPREHENSIVE METABOLIC PANEL WITH GFR - Abnormal; Notable for the following components:   Sodium 132 (*)    Glucose, Bld 115 (*)    Creatinine, Ser 2.00 (*)  Calcium  8.5 (*)    GFR, Estimated 39 (*)    All other components within normal limits  BRAIN NATRIURETIC PEPTIDE - Abnormal; Notable for the following components:   B Natriuretic Peptide 424.2 (*)    All other components within normal limits  PROTIME-INR - Abnormal; Notable for the following components:   Prothrombin Time 19.0 (*)    INR 1.6 (*)    All other components within normal limits  BLOOD GAS, VENOUS - Abnormal; Notable for the following components:   pCO2, Ven 38 (*)    All other components within normal limits  TROPONIN I (HIGH SENSITIVITY) - Abnormal; Notable for the following components:   Troponin I (High Sensitivity) 123 (*)    All other components within normal limits  TROPONIN I (HIGH SENSITIVITY) - Abnormal; Notable for the following components:   Troponin I (High Sensitivity)  142 (*)    All other components within normal limits  RESP PANEL BY RT-PCR (RSV, FLU A&B, COVID)  RVPGX2  CULTURE, BLOOD (ROUTINE X 2)  CULTURE, BLOOD (ROUTINE X 2)  LACTIC ACID, PLASMA  LACTIC ACID, PLASMA  URINALYSIS, ROUTINE W REFLEX MICROSCOPIC     EKG  ED ECG REPORT I, Twilla Galea, the attending physician, personally viewed and interpreted this ECG.   Date: 02/17/2024  EKG Time: 10:35  Rate: 114  Rhythm: sinus tachycardia  Axis: Normal  Intervals:none  ST&T Change: None  RADIOLOGY Chest x-ray reviewed and interpreted by me with no infiltrate, edema, or effusion.  PROCEDURES:  Critical Care performed: Yes, see critical care procedure note(s)  .Critical Care  Performed by: Twilla Galea, MD Authorized by: Twilla Galea, MD   Critical care provider statement:    Critical care time (minutes):  30   Critical care time was exclusive of:  Separately billable procedures and treating other patients and teaching time   Critical care was necessary to treat or prevent imminent or life-threatening deterioration of the following conditions:  Respiratory failure   Critical care was time spent personally by me on the following activities:  Development of treatment plan with patient or surrogate, discussions with consultants, evaluation of patient's response to treatment, examination of patient, ordering and review of laboratory studies, ordering and review of radiographic studies, ordering and performing treatments and interventions, pulse oximetry, re-evaluation of patient's condition and review of old charts   I assumed direction of critical care for this patient from another provider in my specialty: no     Care discussed with: admitting provider      MEDICATIONS ORDERED IN ED: Medications  azithromycin  (ZITHROMAX ) 500 mg in sodium chloride  0.9 % 250 mL IVPB (500 mg Intravenous New Bag/Given 02/17/24 1337)  furosemide  (LASIX ) injection 80 mg (has no administration in  time range)  cefTRIAXone  (ROCEPHIN ) 2 g in sodium chloride  0.9 % 100 mL IVPB (0 g Intravenous Stopped 02/17/24 1337)  iohexol  (OMNIPAQUE ) 350 MG/ML injection 60 mL (60 mLs Intravenous Contrast Given 02/17/24 1158)     IMPRESSION / MDM / ASSESSMENT AND PLAN / ED COURSE  I reviewed the triage vital signs and the nursing notes.                              53 y.o. male with past medical history of hypertension, diabetes, DVT/PE on Coumadin , chronic thromboembolic pulmonary hypertension on milrinone  infusion, and CKD who presents to the ED complaining of increasing difficulty breathing over the past 24 hours.  Patient's  presentation is most consistent with acute presentation with potential threat to life or bodily function.  Differential diagnosis includes, but is not limited to, ACS, PE, pneumonia, sepsis, CHF, pulmonary hypertension, anemia, electrolyte abnormality, AKI.  Patient chronically ill-appearing with increased work of breathing, oxygen saturations noted to be in the mid 80s on his usual 4 L nasal cannula.  He was placed on high flow nasal cannula with improvement, VBG and chest x-ray are pending at this time.  EKG shows sinus tachycardia with no ischemic changes, additional labs including troponin are pending.  Patient noted to be febrile with EMS, tachycardic and tachypneic here in the ED.  Presentation concerning for sepsis and we will cover for pneumonia with IV antibiotics.  Chest x-ray shows looping of central line for milrinone  infusion, otherwise no acute finding noted.  VBG is reassuring with no hypercapnia, but INR is subtherapeutic and with concerns for medication noncompliance, CTA chest was performed.  This demonstrates chronic PE with no acute finding, does redemonstrate looping of his central line.  Labs without significant anemia, leukocytosis, electrolyte abnormality, or AKI.  Lactic acid within normal limits and suspicion is low for sepsis and infection at this time.   Troponin significantly elevated and uptrending on recheck, suspect this is due to known pulmonary hypertension rather than ACS.  Case discussed with Dr. Bruce Caper of advanced heart failure, who agrees with plan for diuresis with 80 mg of IV Lasix , advanced heart failure to see patient during admission and address malpositioned catheter.  Case discussed with hospitalist for admission.      FINAL CLINICAL IMPRESSION(S) / ED DIAGNOSES   Final diagnoses:  CTEPH (chronic thromboembolic pulmonary hypertension) (HCC)  Acute respiratory failure with hypoxia (HCC)     Rx / DC Orders   ED Discharge Orders     None        Note:  This document was prepared using Dragon voice recognition software and may include unintentional dictation errors.   Twilla Galea, MD 02/17/24 [phone]

## 2024-02-17 NOTE — Consult Note (Signed)
 Advanced Heart Failure Team Consult Note   Primary Physician: Thersia Flax, MD HF Cardiologist:  Dr. 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 Vallarie Gauze with [facility].   Ryan [name] is a chronically ill 53 year old male with extensive family history of clotting disorders. 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 Per chart review, has MTHFR gene mutation and was previously followed by Hematology (Dr. Loralie Rocher). There has been some concern for poor compliance with anticoagulation. Hypercoagulable workup has been unrevealing. 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  He has had multiple hospitalizations in the past several months. 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  He underwent mechanical thrombectomy with vascular surgery, and reportedly felt significantly improved at discharge.     Admitted in 10/24 when he began experiencing shortness of breath, cough, and new diarrhea and vomiting.  He presented to the hospital and was found to be febrile with an elevated lactate.  Vital signs were within normal limits, although oxygen requirement had increased.  He was admitted to the ICU for further management.  CTA showed persistent nonocclusive thrombus in the right main pulmonary artery as well as new small left upper lobe segmental PE. During that hospitalization he was diuresed with IV lasix , medications were optimized and he was dishcharged home.   Readmitted in 12/24 with worsening dyspnea d/t acute on chronic RV failure. CTA with resolution of prior left-sided PE and stable chronic right-sided PE. He was diuresed and GDMT titrated. 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  Ryan [name] presents today due to rapidly progressing shortness  of breath, lightheadedness and lower extremity edema. He reports becoming very dyspneic this morning with possible syncopal event. His aunt called 911. H ewas placed on 10L in the ER with improvement in O2 sats to 97%. Lactic acid 1.8 on arrival, WBC ct 6.5, sCr up to 2 from 1.6, BNP 424 from 280. He currently takes torsemide  40mg  every other day alternating with 20mg .   Home Medications Prior to Admission medications   Medication Sig Start Date End Date Taking? Authorizing Provider  albuterol  (VENTOLIN  HFA) 108 (90 Base) MCG/ACT inhaler Inhale 2 puffs into the lungs every 6 (six) hours as needed for wheezing or shortness of breath. 02/23/22  Yes [name], DO  Digoxin  62.5 MCG TABS Take 0.0625 mg by mouth daily. 09/15/23  Yes Thersia Flax, MD  losartan  (COZAAR ) 25 MG tablet Take 1 tablet (25 mg total) by mouth daily. 06/24/23  Yes Ezzard Holms, MD  metoprolol  succinate (TOPROL -XL) 50 MG 24 hr tablet Take 1 tablet (50 mg total) by mouth daily. Take with or immediately following a meal. 06/24/23  Yes Djan, Esther Hem, MD  Multiple Vitamin (MULTIVITAMIN WITH MINERALS) TABS tablet Take 1 tablet by mouth daily. 06/24/23  Yes Ezzard Holms, MD  OXYGEN Inhale 1 L into the lungs daily.   Yes [provider]  spironolactone  (ALDACTONE ) 25 MG tablet Take 1 tablet (25 mg total) by mouth daily. 06/24/23  Yes Ezzard Holms, MD  tirzepatide  (MOUNJARO ) 7.5 MG/0.5ML Pen Inject 7.5 mg into the skin once a week. 08/18/23  Yes Thersia Flax, MD  torsemide  (DEMADEX ) 20 MG tablet Take 1 tablet (20 mg total) by mouth daily. 08/24/23  Yes Sheril Dines, MD  warfarin (COUMADIN ) 5 MG tablet Take 1.5 tablets (7.5 mg total) by mouth daily. 08/22/23  Yes Sheril Dines, MD  ondansetron  (ZOFRAN ) 4 MG tablet Take 1 tablet (4 mg total) by mouth every 8 (eight) hours as needed for nausea or vomiting. Patient not taking: Reported on 09/14/2023 08/18/23   Thersia Flax, MD    Past Medical History: Past  Medical History:  Diagnosis Date   (HFpEF) heart failure with preserved ejection fraction [facility])    Arrhythmia    atrial fibrillation   CHF (congestive heart failure) (HCC)    Chronic kidney disease 08/2013   Diabetes mellitus type II, non insulin  dependent (HCC)    Diabetes mellitus without complication (HCC)    per pt-pre diabetic-Dr Jadali stated said pt   DVT (deep venous thrombosis) (HCC)    Over 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  Regular the workup has not been forthcoming) previously followed by Dr. Loralie Rocher at [facility]   Hepatic steatosis    Hypertension    Morbid obesity with BMI of 50.0-59.9, adult (HCC)    Oxygen deficiency    Peripheral vascular disease (HCC)    Presence of IVC filter    Has 2 IVC filters with significant thrombus burden superior to the filter.   Pulmonary embolus (HCC) 12/2016   Initially just treated with anticoagulation, but in February 2023 in April 2024 treated with EKOS thrombectomy for submassive PE.   Ventricular trigeminy    Weakness of both lower limbs     Past Surgical History: Past Surgical History:  Procedure Laterality Date   CARDIAC ELECTROPHYSIOLOGY STUDY AND ABLATION  10/2020   CARDIOVERSION N/A 06/21/2023   Procedure: CARDIOVERSION;  Surgeon: Alwin Baars, DO;  Location: [facility] ORS;  Service: Cardiovascular;  Laterality: N/A;   IR FLUORO GUIDE CV LINE RIGHT  09/22/2023   PERIPHERAL VASCULAR CATHETERIZATION N/A 01/10/2015   Procedure: Dialysis/Perma Catheter Insertion;  Surgeon: Jackquelyn Mass, MD;  Location: [facility] INVASIVE CV LAB;  Service: Cardiovascular;  Laterality: N/A;   PERIPHERAL VASCULAR CATHETERIZATION N/A 02/24/2015   Procedure: Dialysis/Perma Catheter Removal;  Surgeon: Celso College, MD;  Location: [facility] INVASIVE CV LAB;  Service: Cardiovascular;  Laterality: N/A;   PULMONARY ANGIOGRAPHY  12/16/2023   Procedure: PULMONARY ANGIOGRAPHY;  Surgeon: Lauralee Poll, MD;  Location: [facility] INVASIVE CV LAB;  Service: Cardiovascular;;   PULMONARY  THROMBECTOMY N/A 12/13/2022   Procedure: PULMONARY THROMBECTOMY;  Surgeon: Celso College, MD;  Location: [facility] INVASIVE CV LAB;  Service: Cardiovascular;  Laterality: N/A;   PULMONARY THROMBECTOMY N/A 05/17/2023   Procedure: PULMONARY THROMBECTOMY;  Surgeon: Jackquelyn Mass, MD;  Location: [facility] INVASIVE CV LAB;  Service: Cardiovascular;  Laterality: N/A;   RIGHT HEART CATH Right 09/20/2023   Procedure: RIGHT HEART CATH;  Surgeon: Lauralee Poll, MD;  Location: [facility] INVASIVE CV LAB;  Service: Cardiovascular;  Laterality: Right;   RIGHT HEART CATH N/A 12/16/2023   Procedure: RIGHT HEART CATH;  Surgeon: Lauralee Poll, MD;  Location: [facility] INVASIVE CV LAB;  Service: Cardiovascular;  Laterality: N/A;   TEE WITHOUT CARDIOVERSION N/A 06/21/2023   Procedure: TRANSESOPHAGEAL ECHOCARDIOGRAM (TEE);  Surgeon: Alwin Baars, DO;  Location: [facility] ORS;  Service: Cardiovascular;  Laterality: N/A;    Family History: Family History  Problem Relation Age of Onset   Hypertension Mother    Cancer Mother    Breast cancer Mother    Prostate cancer Father    Hypertension Father    Diabetes Father    Heart disease  Maternal Grandmother    Pulmonary embolism Paternal Grandfather    Pulmonary embolism Paternal Uncle    Pulmonary embolism Cousin    Deep vein thrombosis Cousin    Pulmonary embolism Paternal Aunt     Social History: Social History   Socioeconomic History   Marital status: Married    Spouse name: Not on file   Number of children: Not on file   Years of education: Not on file   Highest education level: Some college, no degree  Occupational History   Not on file  Tobacco Use   Smoking status: Never   Smokeless tobacco: Never  Vaping Use   Vaping status: Never Used  Substance and Sexual Activity   Alcohol use: Never   Drug use: Never   Sexual activity: Yes    Partners: Female  Other Topics Concern   Not on file  Social History Narrative   Not on file   Social Drivers of  Health   Financial Resource Strain: Low Risk  (08/18/2023)   Overall Financial Resource Strain (CARDIA)    Difficulty of Paying Living Expenses: Not hard at all  Food Insecurity: No Food Insecurity (12/07/2023)   Hunger Vital Sign    Worried About Running Out of Food in the Last Year: Never true    Ran Out of Food in the Last Year: Never true  Transportation Needs: Unmet Transportation Needs (12/14/2023)   PRAPARE - Transportation    Lack of Transportation (Medical): Yes    Lack of Transportation (Non-Medical): Yes  Physical Activity: Insufficiently Active (08/18/2023)   Exercise Vital Sign    Days of Exercise per Week: 3 days    Minutes of Exercise per Session: 30 min  Stress: No Stress Concern Present (08/18/2023)   [employer] of Occupational Health - Occupational Stress Questionnaire    Feeling of Stress : Only a little  Social Connections: Moderately Isolated (10/10/2023)   Social Connection and Isolation Panel    Frequency of Communication with Friends and Family: More than three times a week    Frequency of Social Gatherings with Friends and Family: More than three times a week    Attends Religious Services: Never    Database administrator or Organizations: No    Attends Banker Meetings: Never    Marital Status: Married    Allergies:  Allergies  Allergen Reactions   Jardiance  [Empagliflozin ]     Caused infection    Metrizamide Other (See Comments)    Kidney failure requiring dialysis    Clindamycin/Lincomycin Rash   Lincomycin Rash   Sulfa Antibiotics Rash    Objective:    Vital Signs:   Temp:  [99.8 F (37.7 C)] 99.8 F (37.7 C) (06/20 1046) Pulse Rate:  [97-109] 103 (06/20 1430) Resp:  [28-32] 32 (06/20 1100) BP: (113-134)/(66-80) 113/70 (06/20 1430) SpO2:  [89 %-98 %] 95 % (06/20 1430) Weight:  [155.6 kg] 155.6 kg (06/20 1029)    Weight change: Filed Weights   02/17/24 1029  Weight: (!) 155.6 kg     Intake/Output:   Intake/Output Summary (Last 24 hours) at 02/17/2024 1443 Last data filed at 02/17/2024 1337 Gross per 24 hour  Intake 100 ml  Output --  Net 100 ml      Physical Exam    Vitals:   02/17/24 1400 02/17/24 1430  BP: 118/74 113/70  Pulse: (!) 102 (!) 103  Resp:    Temp:    SpO2: 96% 95%   GENERAL: dyspneic Lungs-  CTA CARDIAC:  JVP: difficult to assess          Normal rate with regular rhythm. no murmur.  Pulses 2+. 1+ chronic pitting edema.  ABDOMEN: Soft, non-tender, non-distended.  EXTREMITIES: Warm and well perfused.  NEUROLOGIC: No obvious FND   Telemetry   NSR  Labs   Basic Metabolic Panel: Recent Labs  Lab 02/17/24 1037  NA 132*  K 4.0  CL 101  CO2 22  GLUCOSE 115*  BUN 16  CREATININE 2.00*  CALCIUM  8.5*    Liver Function Tests: Recent Labs  Lab 02/17/24 1037  AST 27  ALT 16  ALKPHOS 45  BILITOT 1.1  PROT 8.0  ALBUMIN  3.5   No results for input(s): LIPASE, AMYLASE in the last 168 hours. No results for input(s): AMMONIA in the last 168 hours.  CBC: Recent Labs  Lab 02/17/24 1037  WBC 6.5  NEUTROABS 4.9  HGB 13.0  HCT 41.4  MCV 85.0  PLT 187    Cardiac Enzymes: No results for input(s): CKTOTAL, CKMB, CKMBINDEX, TROPONINI in the last 168 hours.  BNP: BNP (last 3 results) Recent Labs    09/19/23 2216 10/24/23 1410 02/17/24 1037  BNP 280.4* 84.4 424.2*    ProBNP (last 3 results) No results for input(s): PROBNP in the last 8760 hours.   CBG: No results for input(s): GLUCAP in the last 168 hours.   Coagulation Studies: Recent Labs    02/17/24 1037  LABPROT 19.0*  INR 1.6*     Imaging   DG Chest Portable 1 View Result Date: 02/17/2024 CLINICAL DATA:  Dyspnea and cough EXAM: PORTABLE CHEST 1 VIEW COMPARISON:  09/26/2023 chest radiograph. FINDINGS: Right internal jugular central venous catheter loops within and terminates within the right brachiocephalic vein, with the tip  oriented superiorly, new from prior radiograph. Stable cardiomediastinal silhouette with top normal heart size and with prominent main pulmonary artery contour. No pneumothorax. No pleural effusion. No overt pulmonary edema. No acute consolidative airspace disease. IMPRESSION: 1. Right internal jugular central venous catheter loops within and terminates within the right brachiocephalic vein, with the tip oriented superiorly, new from prior radiograph. 2. No acute cardiopulmonary disease. 3. Chronic prominent main pulmonary artery contour suggesting dilated pulmonary artery and pulmonary hypertension. Electronically Signed   By: Levell Reach M.D.   On: 02/17/2024 12:38   CT Angio Chest PE W/Cm &/Or Wo Cm Result Date: 02/17/2024 CLINICAL DATA:  Pulmonary embolism (PE) suspected, high prob, dyspnea, history of chronic pulmonary embolism EXAM: CT ANGIOGRAPHY CHEST WITH CONTRAST TECHNIQUE: Multidetector CT imaging of the chest was performed using the standard protocol during bolus administration of intravenous contrast. Multiplanar CT image reconstructions and MIPs were obtained to evaluate the vascular anatomy. RADIATION DOSE REDUCTION: This exam was performed according to the departmental dose-optimization program which includes automated exposure control, adjustment of the mA and/or kV according to patient size and/or use of iterative reconstruction technique. CONTRAST:  60mL OMNIPAQUE  IOHEXOL  350 MG/ML SOLN COMPARISON:  10/16/2023 chest CT angiogram and Chest radiograph from earlier today. FINDINGS: Cardiovascular: The study is moderate quality for the evaluation of pulmonary embolism, limited by some motion degradation. Chronic eccentric nonobstructive thrombus in the anterior right pulmonary artery extending into the lobar branches, not appreciably changed. Chronic web-like filling defects in the bilateral lower lobe pulmonary artery branches, not appreciably changed. No acute pulmonary artery filling defects.  Atherosclerotic nonaneurysmal thoracic aorta. Dilated main pulmonary artery (3.9 cm diameter). Borderline mild cardiomegaly. No significant pericardial fluid/thickening. Right internal jugular central  venous catheter loops in and terminates within the right brachiocephalic vein. Mediastinum/Nodes: No significant thyroid  nodules. Unremarkable esophagus. No pathologically enlarged axillary, mediastinal or hilar lymph nodes. Lungs/Pleura: No pneumothorax. No pleural effusion. No acute consolidative airspace disease, lung masses or significant pulmonary nodules. Upper abdomen: Small hiatal hernia. IVC filter partially visualized in the suprarenal IVC, unchanged. Musculoskeletal: No aggressive appearing focal osseous lesions. Extensive subcutaneous varices noted throughout the bilateral ventral chest wall, unchanged. Moderate thoracic spondylosis. Review of the MIP images confirms the above findings. IMPRESSION: 1. No acute pulmonary embolism. 2. 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 3. Borderline mild cardiomegaly. 4. Small hiatal hernia. 5. Right internal jugular central venous catheter loops in and terminates within the right brachiocephalic vein. 6. 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 7.  Aortic Atherosclerosis (ICD10-I70.0). Electronically Signed   By: Levell Reach M.D.   On: 02/17/2024 12:37   CT Head Wo Contrast Result Date: 02/17/2024 EXAM: CT HEAD WITHOUT CONTRAST 02/17/2024 12:17:03 PM TECHNIQUE: CT of the head was performed without the administration of intravenous contrast. Automated exposure control, iterative reconstruction, and/or weight based adjustment of the mA/kV was utilized to reduce the radiation dose to as low as reasonably achievable. COMPARISON: 07/08/2004. CLINICAL HISTORY: Mental status change, unknown cause. Best possible. Unable to hold still due to difficulty  breathing. Repeat head one to try for less motion. FINDINGS: BRAIN AND VENTRICLES: No acute hemorrhage. Gray-white differentiation is preserved. No hydrocephalus. No extra-axial collection. No mass effect or midline shift. ORBITS: No acute abnormality. SINUSES: No acute abnormality. SOFT TISSUES AND SKULL: No acute soft tissue abnormality. No skull fracture. IMPRESSION: 1. No acute intracranial abnormality. Electronically signed by: Audra Blend MD 02/17/2024 12:25 PM EDT RP Workstation: [id number]    HEMODYNAMICS: RA:       13 mmHg (mean) RV:       110/4, 13 mmHg PA:       110/36 mmHg (60 mean) PCWP: 12 mmHg (mean)                                      Estimated Fick CO/CI   5.07L/min, 1.96L/min/m2       Thermodilution CO/CI   5.15L/min, 1.99L/min/m2                                      TPG  48  mmHg                                               PVR  9.3 Wood Units  PAPi  5.7      Medications:     Current Medications:  furosemide   80 mg Intravenous Once    Infusions:      Patient Profile   53 y.o. 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  -IV  lasix  80mg  BID  -continue milrinone  0.25mcg/kg/min.  -Lactic acid 1.5.  - Will admit, correct placement of tunneled catheter.  - Discussed with ER physician; will order COOX from [facility].  - Pharmacy consult for warfarin. Bridge with lovenox .   2. Recurrent DVT/PE -Chronic, stable PE on CTA this admit.  -INR subtherapeutic  -Pharmacy consult for warfarin.   3. Atrial fibrillation/flutter -sinus tachycardia currently - warfarin  4. CKD  IIIa -sCr up to 1.6 today.    Length of Stay: 0  Jasmina Gendron, DO  02/17/2024, 2:43 PM  Advanced Heart Failure Team Pager [phone] (M-F; 7a - 5p)  Please contact [facility] for night-coverage after hours (4p -7a ) and weekends on [email or web address]

## 2024-02-18 ENCOUNTER — Inpatient Hospital Stay

## 2024-02-18 DIAGNOSIS — I2781 Cor pulmonale (chronic): Secondary | ICD-10-CM | POA: Diagnosis not present

## 2024-02-18 LAB — IRON AND TIBC
Iron: 18 ug/dL — ABNORMAL LOW (ref 45–182)
Saturation Ratios: 5 % — ABNORMAL LOW (ref 17.9–39.5)
TIBC: 354 ug/dL (ref 250–450)
UIBC: 336 ug/dL

## 2024-02-18 LAB — BLOOD CULTURE ID PANEL (REFLEXED) - BCID2

## 2024-02-18 LAB — VITAMIN D 25 HYDROXY (VIT D DEFICIENCY, FRACTURES): Vit D, 25-Hydroxy: 11.15 ng/mL — ABNORMAL LOW (ref 30–100)

## 2024-02-18 LAB — CBC
HCT: 37.9 % — ABNORMAL LOW (ref 39.0–52.0)
Hemoglobin: 11.9 g/dL — ABNORMAL LOW (ref 13.0–17.0)
MCH: 26.9 pg (ref 26.0–34.0)
MCHC: 31.4 g/dL (ref 30.0–36.0)
MCV: 85.7 fL (ref 80.0–100.0)
Platelets: 147 10*3/uL — ABNORMAL LOW (ref 150–400)
RBC: 4.42 MIL/uL (ref 4.22–5.81)
RDW: 16.2 % — ABNORMAL HIGH (ref 11.5–15.5)
WBC: 4.4 10*3/uL (ref 4.0–10.5)
nRBC: 0 % (ref 0.0–0.2)

## 2024-02-18 LAB — VANCOMYCIN, RANDOM: Vancomycin Rm: 9 ug/mL

## 2024-02-18 LAB — C DIFFICILE QUICK SCREEN W PCR REFLEX
C Diff antigen: POSITIVE — AB
C Diff toxin: NEGATIVE

## 2024-02-18 LAB — GLUCOSE, CAPILLARY
Glucose-Capillary: 116 mg/dL — ABNORMAL HIGH (ref 70–99)
Glucose-Capillary: 134 mg/dL — ABNORMAL HIGH (ref 70–99)
Glucose-Capillary: 109 mg/dL — ABNORMAL HIGH (ref 70–99)
Glucose-Capillary: 118 mg/dL — ABNORMAL HIGH (ref 70–99)

## 2024-02-18 LAB — BASIC METABOLIC PANEL WITH GFR
Anion gap: 11 (ref 5–15)
Anion gap: 12 (ref 5–15)
BUN: 21 mg/dL — ABNORMAL HIGH (ref 6–20)
BUN: 21 mg/dL — ABNORMAL HIGH (ref 6–20)
CO2: 18 mmol/L — ABNORMAL LOW (ref 22–32)
CO2: 19 mmol/L — ABNORMAL LOW (ref 22–32)
Calcium: 8.2 mg/dL — ABNORMAL LOW (ref 8.9–10.3)
Calcium: 8 mg/dL — ABNORMAL LOW (ref 8.9–10.3)
Chloride: 103 mmol/L (ref 98–111)
Chloride: 102 mmol/L (ref 98–111)
Creatinine, Ser: 1.96 mg/dL — ABNORMAL HIGH (ref 0.61–1.24)
Creatinine, Ser: 1.93 mg/dL — ABNORMAL HIGH (ref 0.61–1.24)
GFR, Estimated: 40 mL/min — ABNORMAL LOW (ref >60)
GFR, Estimated: 41 mL/min — ABNORMAL LOW (ref >60)
Glucose, Bld: 103 mg/dL — ABNORMAL HIGH (ref 70–99)
Glucose, Bld: 127 mg/dL — ABNORMAL HIGH (ref 70–99)
Potassium: 3.3 mmol/L — ABNORMAL LOW (ref 3.5–5.1)
Potassium: 3.3 mmol/L — ABNORMAL LOW (ref 3.5–5.1)
Sodium: 132 mmol/L — ABNORMAL LOW (ref 135–145)
Sodium: 133 mmol/L — ABNORMAL LOW (ref 135–145)

## 2024-02-18 LAB — GASTROINTESTINAL PANEL BY PCR, STOOL (REPLACES STOOL CULTURE)

## 2024-02-18 LAB — VITAMIN B12: Vitamin B-12: 156 pg/mL — ABNORMAL LOW (ref 180–914)

## 2024-02-18 LAB — MAGNESIUM: Magnesium: 1.9 mg/dL (ref 1.7–2.4)

## 2024-02-18 LAB — LACTIC ACID, PLASMA: Lactic Acid, Venous: 1.3 mmol/L (ref 0.5–1.9)

## 2024-02-18 LAB — PHOSPHORUS: Phosphorus: 2.5 mg/dL (ref 2.5–4.6)

## 2024-02-18 LAB — CLOSTRIDIUM DIFFICILE BY PCR, REFLEXED: Toxigenic C. Difficile by PCR: POSITIVE — AB

## 2024-02-18 LAB — HIV ANTIBODY (ROUTINE TESTING W REFLEX): HIV Screen 4th Generation wRfx: NONREACTIVE

## 2024-02-18 LAB — PROTIME-INR
INR: 2 — ABNORMAL HIGH (ref 0.8–1.2)
Prothrombin Time: 23 s — ABNORMAL HIGH (ref 11.4–15.2)

## 2024-02-18 LAB — PROCALCITONIN: Procalcitonin: 0.89 ng/mL

## 2024-02-18 LAB — MRSA NEXT GEN BY PCR, NASAL: MRSA by PCR Next Gen: NOT DETECTED

## 2024-02-18 LAB — FOLATE: Folate: 18.3 ng/mL (ref >5.9)

## 2024-02-18 MED ORDER — SODIUM BICARBONATE 8.4 % IV SOLN
100.0000 meq | Freq: Once | INTRAVENOUS | Status: AC
  Administered 2024-02-18: 100 meq via INTRAVENOUS
  Filled 2024-02-18: qty 100

## 2024-02-18 MED ORDER — VANCOMYCIN HCL 125 MG PO CAPS: 125.0000 mg | ORAL_CAPSULE | Freq: Four times a day (QID) | ORAL | Status: DC

## 2024-02-18 MED ORDER — VITAMIN B-12 1000 MCG PO TABS
1000.0000 ug | ORAL_TABLET | Freq: Every day | ORAL | Status: DC
  Administered 2024-02-25 – 2024-02-29 (×5): 1000 ug via ORAL
  Filled 2024-02-18 (×5): qty 1

## 2024-02-18 MED ORDER — CYANOCOBALAMIN 1000 MCG/ML IJ SOLN
1000.0000 ug | Freq: Every day | INTRAMUSCULAR | Status: AC
  Administered 2024-02-18 – 2024-02-24 (×7): 1000 ug via INTRAMUSCULAR
  Filled 2024-02-18 (×7): qty 1

## 2024-02-18 MED ORDER — SODIUM BICARBONATE 8.4 % IV SOLN
50.0000 meq | Freq: Once | INTRAVENOUS | Status: AC
  Administered 2024-02-18: 50 meq via INTRAVENOUS
  Filled 2024-02-18: qty 50

## 2024-02-18 MED ORDER — VITAMIN D (ERGOCALCIFEROL) 1.25 MG (50000 UNIT) PO CAPS
50000.0000 [IU] | ORAL_CAPSULE | ORAL | Status: DC
  Administered 2024-02-18 – 2024-02-25 (×2): 50000 [IU] via ORAL
  Filled 2024-02-18 (×2): qty 1

## 2024-02-18 MED ORDER — IRON SUCROSE 300 MG IVPB - SIMPLE MED
300.0000 mg | Status: AC
  Administered 2024-02-18 – 2024-02-20 (×3): 300 mg via INTRAVENOUS
  Filled 2024-02-18 (×3): qty 300

## 2024-02-18 MED ORDER — VANCOMYCIN HCL 125 MG PO CAPS
125.0000 mg | ORAL_CAPSULE | Freq: Four times a day (QID) | ORAL | Status: AC
  Administered 2024-02-18 – 2024-02-28 (×40): 125 mg via ORAL
  Filled 2024-02-18 (×40): qty 1

## 2024-02-18 MED ORDER — VANCOMYCIN HCL 1750 MG/350ML IV SOLN
1750.0000 mg | Freq: Once | INTRAVENOUS | Status: AC
  Administered 2024-02-18: 1750 mg via INTRAVENOUS
  Filled 2024-02-18: qty 350

## 2024-02-18 MED ORDER — VANCOMYCIN VARIABLE DOSE PER UNSTABLE RENAL FUNCTION (PHARMACIST DOSING): Status: DC

## 2024-02-18 MED ORDER — SACCHAROMYCES BOULARDII 250 MG PO CAPS
250.0000 mg | ORAL_CAPSULE | Freq: Two times a day (BID) | ORAL | Status: DC
  Administered 2024-02-18 – 2024-02-29 (×23): 250 mg via ORAL
  Filled 2024-02-18 (×24): qty 1

## 2024-02-18 MED ORDER — WARFARIN SODIUM 4 MG PO TABS
8.0000 mg | ORAL_TABLET | Freq: Once | ORAL | Status: AC
  Administered 2024-02-18: 8 mg via ORAL
  Filled 2024-02-18: qty 2

## 2024-02-18 NOTE — Consult Note (Signed)
 Pharmacy Antibiotic Note  Ryan [name] is a 53 y.o. male admitted on 02/17/2024 with shortness of breathand fever of unknown origin.  Patient also Cdiff(+), receiving oral vancomycin  concomitantly. Pharmacy has been consulted for Vancomycin  dosing.  Monitoring: 06/21 @ 2012 : Vancomycin , random level = 9  Plan: Administer vancomycin  1750mg  IV x 1 Will continue to dose via random levels until renal function stabilizes  Height: 5' 7 (170.2 cm) Weight: (!) 157 kg (346 lb 2 oz) IBW/kg (Calculated) : 66.1  Temp (24hrs), Avg:99 F (37.2 C), Min:98.3 F (36.8 C), Max:100.1 F (37.8 C)  Recent Labs  Lab 02/17/24 1037 02/17/24 1919 02/17/24 2148 02/18/24 0503 02/18/24 1345 02/18/24 2012  WBC 6.5  --   --  4.4  --   --   CREATININE 2.00*  --   --  1.96* 1.93*  --   LATICACIDVEN 1.5  1.8 2.0* 1.1 1.3  --   --   VANCORANDOM  --   --   --   --   --  9    Estimated Creatinine Clearance: 63.4 mL/min (A) (by C-G formula based on SCr of 1.93 mg/dL (H)).    Allergies  Allergen Reactions   Jardiance  [Empagliflozin ]     Caused infection    Metrizamide Other (See Comments)    Kidney failure requiring dialysis    Clindamycin/Lincomycin Rash   Lincomycin Rash   Sulfa Antibiotics Rash    Antimicrobials this admission: Cefepime  6/20 >>  Vancomycin  6/20 >>  Ceftriaxone /Azithromycin  6/20 x 1  Dose adjustments this admission: N/A  Microbiology results: 6/20 BCx: collected   Thank you for allowing pharmacy to be a part of this patient's care.  Will M. Lenon, PharmD Clinical Pharmacist 02/18/2024 9:38 PM

## 2024-02-18 NOTE — Progress Notes (Signed)
 Secure chat with Dr Von re Tunneled line  Per CXR results looped in brachiocepahlic region.  Per VAS Team notes, the tunneled line is out at 5cm mark.  Rebekah  VAS T RN to  attempt to power flush line to get it unlooped and will recheck CXR for results.  Pleasant also to check exit site for dressing CDI.  IR to remove tunneled line Monday.  Dr Von also aware of positive blood cx results today.  Recommend to have 48 hour line holiday once the tunneled line is removed, then place PICC if repeat blood cx results are negative.  If tunneled line functional after power flushing, then utilize it vs start PIVx2 for IV medication needs.  PICC order canceled by Dr Von.

## 2024-02-18 NOTE — Consult Note (Signed)
 Pharmacy Consult Note - Anticoagulation  Pharmacy Consult for warfarin Indication: CTEPH and atrial fibrillation  PATIENT MEASUREMENTS: Height: 5' 7 (170.2 cm) Weight: (!) 157 kg (346 lb 2 oz) IBW/kg (Calculated) : 66.1 HEPARIN  DW (KG): 104.5  VITAL SIGNS: Temp: 98.3 F (36.8 C) (06/21 0743) Temp Source: Oral (06/21 0743) BP: 113/64 (06/21 0743) Pulse Rate: 100 (06/21 0743)  Recent Labs    02/17/24 1222 02/18/24 0503  HGB  --  11.9*  HCT  --  37.9*  PLT  --  147*  LABPROT  --  23.0*  INR  --  2.0*  CREATININE  --  1.96*  TROPONINIHS 142*  --     Estimated Creatinine Clearance: 62.5 mL/min (A) (by C-G formula based on SCr of 1.96 mg/dL (H)).  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  Regular the workup has not been forthcoming) previously followed by Dr. Darcy at [facility]   Hepatic steatosis    Hypertension    Morbid obesity with BMI of 50.0-59.9, adult (HCC)    Oxygen deficiency    Peripheral vascular disease (HCC)    Presence of IVC filter    Has 2 IVC filters with significant thrombus burden superior to the filter.   Pulmonary embolus (HCC) 12/2016   Initially just treated with anticoagulation, but in February 2023 in April 2024 treated with EKOS thrombectomy for submassive PE.   Ventricular trigeminy    Weakness of both lower limbs     ASSESSMENT: 53 y.o. 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 Patient is on chronic anticoagulation  per chart review. Has been on Xarelto  in the past but is currently on warfarin with INR goal 2.5-3.5. Pharmacy has been consulted to manage warfarin while patient is admitted. INR currently subtherapeutic; no known missed doses however, per wife, medication adherence overall is question. Patient is receiving enoxaparin  until INR is therapeutic x2.  Pertinent medications: Warfarin 6mg  on Tuesday, Thursday, Sunday Warfarin 5mg  all other days Has 5mg  and 1mg  tablets at home Last dose 6/19 at bedtime Total weekly warfarin = 38mg   Goal(s) of therapy: INR 2.5 - 3.5 Monitor platelets by anticoagulation protocol: Yes   Baseline anticoagulation labs: Recent Labs    02/17/24 1037 02/18/24 0503  INR 1.6* 2.0*  HGB 13.0 11.9*  PLT 187 147*      Date:  INR: Dose: 6/20 1.6 8 mg 6/21 2.0 8 mg ordered   PLAN: Administer warfarin 8mg  PO x 1 today (home regimen normally 6mg  on Saturdays) Continue enoxaparin  1mg /kg q12H until INR is therapeutic x 2 CBC daily   Ryan [name] A Beyonca Wisz, PharmD Clinical Pharmacist 02/18/2024 8:11 AM

## 2024-02-18 NOTE — Progress Notes (Signed)
 PHARMACY - PHYSICIAN COMMUNICATION CRITICAL VALUE ALERT - BLOOD CULTURE IDENTIFICATION (BCID)  Ryan [name] is an 53 y.o. male who presented to [facility] on 02/17/2024 with a chief complaint of Cor pulmonale.   Assessment:   Staph epi in 1 of 4 bottles, Mec A detected.  (include suspected source if known)  Name of physician (or Provider) Contacted: Erminio Cone, NP   Current antibiotics: Vanc,  Cefepime    Changes to prescribed antibiotics recommended:  Patient is on recommended antibiotics - No changes needed  Results for orders placed or performed during the hospital encounter of 02/17/24  Blood Culture ID Panel (Reflexed) (Collected: 02/17/2024 10:37 AM)  Result Value Ref Range   Enterococcus faecalis NOT DETECTED NOT DETECTED   Enterococcus Faecium NOT DETECTED NOT DETECTED   Listeria monocytogenes NOT DETECTED NOT DETECTED   Staphylococcus species DETECTED (A) NOT DETECTED   Staphylococcus aureus (BCID) NOT DETECTED NOT DETECTED   Staphylococcus epidermidis DETECTED (A) NOT DETECTED   Staphylococcus lugdunensis NOT DETECTED NOT DETECTED   Streptococcus species NOT DETECTED NOT DETECTED   Streptococcus agalactiae NOT DETECTED NOT DETECTED   Streptococcus pneumoniae NOT DETECTED NOT DETECTED   Streptococcus pyogenes NOT DETECTED NOT DETECTED   A.calcoaceticus-baumannii NOT DETECTED NOT DETECTED   Bacteroides fragilis NOT DETECTED NOT DETECTED   Enterobacterales NOT DETECTED NOT DETECTED   Enterobacter cloacae complex NOT DETECTED NOT DETECTED   Escherichia coli NOT DETECTED NOT DETECTED   Klebsiella aerogenes NOT DETECTED NOT DETECTED   Klebsiella oxytoca NOT DETECTED NOT DETECTED   Klebsiella pneumoniae NOT DETECTED NOT DETECTED   Proteus species NOT DETECTED NOT DETECTED   Salmonella species NOT DETECTED NOT DETECTED   Serratia marcescens NOT DETECTED NOT DETECTED   Haemophilus influenzae NOT DETECTED NOT DETECTED   Neisseria meningitidis NOT DETECTED NOT DETECTED    Pseudomonas aeruginosa NOT DETECTED NOT DETECTED   Stenotrophomonas maltophilia NOT DETECTED NOT DETECTED   Candida albicans NOT DETECTED NOT DETECTED   Candida auris NOT DETECTED NOT DETECTED   Candida glabrata NOT DETECTED NOT DETECTED   Candida krusei NOT DETECTED NOT DETECTED   Candida parapsilosis NOT DETECTED NOT DETECTED   Candida tropicalis NOT DETECTED NOT DETECTED   Cryptococcus neoformans/gattii NOT DETECTED NOT DETECTED   Methicillin resistance mecA/C DETECTED (A) NOT DETECTED    Shronda Boeh D 02/18/2024  2:26 AM

## 2024-02-18 NOTE — Progress Notes (Signed)
 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   Evidently started to feel generalized malaise 4-5 days PTA.  Had vomiting, unable to eat and 1-2 loose watery stools per day.  Got weaker and weaker until 6/20, he went to drive to his aunts house to meet his milrinone  infusion HHRN but when he got there, he walked in the house and passed out in a chair, so she called 9-1-1.  EMS found that he had a fever, required 10L O2 to keep sats >90%.   In the ER, CXR showed malpositioned CVC but no infiltrates.  CTA chest showed chronic PE, no new findings, no pulmonary edema.   INR subtherapeutic.  Cr up to 2.  Lactate normal.   No fever here.  Given empiric antibiotics, IV Lasix .  Case discussed with advanced HF team Dr. Gardenia who stated they would see the patient.   Wife gave collateral that patient now lives alone, eats only takeout food and she has concerns that his memory is sometimes impaired and she isn't sure he is taking his medicines.   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  Repeated x-ray for confirmation  IR consulted to replace catheter, which will be done most likely on Monday Follow repeat blood cultures  # Diarrhea, unknown cause Also had nausea and vomiting Started probiotics Check stool sample for C. difficile and GI pathogen If negative then we can start Imodium   # AKI on CKD IIIa Baseline sCr 1.4 sCr 2.0 on admission, sCr 1.96  Bladder scan ruled out urinary retention Follow renal sonogram Avoid nephrology medication, your renal dose medication Monitor renal functions and urine output   # Metabolic acidosis due to renal failure Bicarb IV 50 mEq given Follow BMP and replete as needed  #Paroxysmal A-fib Currently patient is in sinus Continue Coumadin  and Lovenox  as above  # HTN, HLD, dHF Health Home torsemide  Continue Lasix  as a  # Type 2 diabetes A1c <7% recently.  Diet controlled - SS correction insulin    # Hyponatremia Mild - Diuresis as above and trend  # Hypokalemia, mild, K3.3  no need to replete now due to renal failure Monitor electrolytes and replete as needed  # Iron  deficiency, transferrin saturation 5%, started Venofer  3 mg IV daily x 3 doses followed by oral supplement at discharge.  # Vitamin D   deficiency: started vitamin D  50,000 units p.o. weekly, follow with PCP to repeat vitamin D  level after 3 to 6 months.  # Vitamin B12 deficiency: Started vitamin B12 1000 mcg IM injection daily during hospital stay, followed by oral supplement.  Follow-up PCP to repeat vitamin B12 level after 3 to 6 months.  # Obstructive sleep apnea, continue CPAP  # Obesity class III, morbid obesity Body mass index is 54.21  kg/m.  Interventions: Calorie restricted diet and daily exercise advised to lose body weight.  Lifestyle modification discussed.   Diet: Heart healthy/carb modified diet DVT Prophylaxis: Subcutaneous Lovenox  and Coumadin   Advance goals of care discussion: Full code  Family Communication: family was present at bedside, at the time of interview.  The pt provided permission to discuss medical plan with the family. Opportunity was given to ask question and all questions were answered satisfactorily.   Disposition:  Pt is from home, admitted with Resp Failure due to CHF and fever, still has Resp Failure, which precludes a safe discharge. Discharge to ome, when stable, may need few days to improve.  Subjective: No significant events overnight, patient still has significant shortness of breath and having diarrhea, no nausea vomiting.  No chest pain or palpitations.  Physical Exam: General: NAD, lying comfortably Appear in no distress, affect appropriate Eyes: PERRLA ENT: Oral Mucosa Clear, moist  Neck: no JVD,  Cardiovascular: S1 and S2 Present, no Murmur,  Respiratory: good respiratory effort, Bilateral Air entry equal and Decreased, no Crackles, no wheezes Abdomen: Bowel Sound present, Soft and no tenderness,  Skin: no rashes Extremities: 4+ edema, with chronic venous stasis pigmentation  Neurologic: without any new focal findings Gait not checked due to patient safety concerns  Vitals:   02/18/24 0408 02/18/24 0500 02/18/24 0743 02/18/24 1228  BP: 110/68  113/64 130/68  Pulse: 71  100 99  Resp: 18  20 (!) 24  Temp: 99.6 F (37.6 C)  98.3 F (36.8 C) 98.3 F (36.8 C)  TempSrc: Oral  Oral Oral  SpO2: 96%  92% 93%  Weight:  (!) 157 kg    Height:        Intake/Output Summary (Last 24 hours) at 02/18/2024 1408 Last data filed at 02/18/2024 1119 Gross per 24 hour  Intake 755.47 ml  Output 1275 ml  Net -519.53 ml   Filed Weights   02/17/24 1029 02/18/24 0500  Weight: (!)  155.6 kg (!) 157 kg    Data Reviewed: I have personally reviewed and interpreted daily labs, tele strips, imagings as discussed above. I reviewed all nursing notes, pharmacy notes, vitals, pertinent old records I have discussed plan of care as described above with RN and patient/family.  CBC: Recent Labs  Lab 02/17/24 1037 02/18/24 0503  WBC 6.5 4.4  NEUTROABS 4.9  --   HGB 13.0 11.9*  HCT 41.4 37.9*  MCV 85.0 85.7  PLT 187 147*   Basic Metabolic Panel: Recent Labs  Lab 02/17/24 1037 02/18/24 0503  NA 132* 132*  K 4.0 3.3*  CL 101 103  CO2 22 18*  GLUCOSE 115* 103*  BUN 16 21*  CREATININE 2.00* 1.96*  CALCIUM  8.5* 8.2*    Studies: DG Chest Port 1 View Result Date: 02/18/2024 CLINICAL DATA:  Vascular catheter EXAM: PORTABLE CHEST 1 VIEW COMPARISON:  Chest radiograph dated 02/17/2024 FINDINGS: Lines/tubes: Right internal jugular venous catheter has been repositioned and tip projects over the SVC. Lungs: Well inflated lungs. Chronic-appearing bilateral perihilar vascular congestion. Pleura: No pneumothorax or pleural  effusion. Heart/mediastinum: Similar mildly enlarged cardiomediastinal silhouette. Bones: No acute osseous abnormality. IMPRESSION: 1. Right internal jugular venous catheter has been repositioned and tip projects over the SVC. No pneumothorax. 2. Chronic-appearing bilateral perihilar vascular congestion. Electronically Signed   By: Limin  Xu M.D.   On: 02/18/2024 12:09   US  EKG SITE RITE Result Date: 02/17/2024 If Site Rite image not attached, placement could not be confirmed due to current cardiac rhythm.   Scheduled Meds:  Chlorhexidine  Gluconate Cloth  6 each Topical Daily   enoxaparin  (LOVENOX ) injection  150 mg Subcutaneous Q12H   furosemide   80 mg Intravenous BID   insulin  aspart  0-5 Units Subcutaneous QHS   insulin  aspart  0-9 Units Subcutaneous TID WC   multivitamin with minerals  1 tablet Oral Daily   pantoprazole   40 mg Oral Daily   Riociguat    2.5 mg Oral TID   saccharomyces boulardii  250 mg Oral BID   vancomycin  variable dose per unstable renal function (pharmacist dosing)   Does not apply See admin instructions   warfarin  8 mg Oral ONCE-1600   Warfarin - Pharmacist Dosing Inpatient   Does not apply q1600   Continuous Infusions:  ceFEPime  (MAXIPIME ) IV 2 g (02/18/24 1330)   milrinone  0.375 mcg/kg/min (02/18/24 1119)   PRN Meds: acetaminophen  **OR** acetaminophen , albuterol , ondansetron  **OR** ondansetron  (ZOFRAN ) IV, oxyCODONE   Time spent: 55 minutes  Author: ELVAN SOR. MD [facility] 02/18/2024 2:08 PM  To reach On-call, see care teams to locate the attending and reach out to them via [email or web address]. If 7PM-7AM, please contact night-coverage If you still have difficulty reaching the attending provider, please page the [facility] (Director on Call) for [facility] on amion for assistance.

## 2024-02-19 DIAGNOSIS — I2781 Cor pulmonale (chronic): Secondary | ICD-10-CM | POA: Diagnosis not present

## 2024-02-19 LAB — PROTIME-INR
INR: 2.2 — ABNORMAL HIGH (ref 0.8–1.2)
Prothrombin Time: 24.7 s — ABNORMAL HIGH (ref 11.4–15.2)

## 2024-02-19 LAB — CBC
HCT: 38.2 % — ABNORMAL LOW (ref 39.0–52.0)
Hemoglobin: 12.2 g/dL — ABNORMAL LOW (ref 13.0–17.0)
MCH: 27.2 pg (ref 26.0–34.0)
MCHC: 31.9 g/dL (ref 30.0–36.0)
MCV: 85.3 fL (ref 80.0–100.0)
Platelets: 151 10*3/uL (ref 150–400)
RBC: 4.48 MIL/uL (ref 4.22–5.81)
RDW: 16.1 % — ABNORMAL HIGH (ref 11.5–15.5)
WBC: 4.9 10*3/uL (ref 4.0–10.5)
nRBC: 0 % (ref 0.0–0.2)

## 2024-02-19 LAB — BASIC METABOLIC PANEL WITH GFR
Anion gap: 8 (ref 5–15)
BUN: 20 mg/dL (ref 6–20)
CO2: 24 mmol/L (ref 22–32)
Calcium: 8 mg/dL — ABNORMAL LOW (ref 8.9–10.3)
Chloride: 102 mmol/L (ref 98–111)
Creatinine, Ser: 1.71 mg/dL — ABNORMAL HIGH (ref 0.61–1.24)
GFR, Estimated: 47 mL/min — ABNORMAL LOW (ref >60)
Glucose, Bld: 108 mg/dL — ABNORMAL HIGH (ref 70–99)
Potassium: 3.1 mmol/L — ABNORMAL LOW (ref 3.5–5.1)
Sodium: 134 mmol/L — ABNORMAL LOW (ref 135–145)

## 2024-02-19 LAB — PHOSPHORUS: Phosphorus: 3.1 mg/dL (ref 2.5–4.6)

## 2024-02-19 LAB — GLUCOSE, CAPILLARY
Glucose-Capillary: 118 mg/dL — ABNORMAL HIGH (ref 70–99)
Glucose-Capillary: 125 mg/dL — ABNORMAL HIGH (ref 70–99)
Glucose-Capillary: 121 mg/dL — ABNORMAL HIGH (ref 70–99)
Glucose-Capillary: 127 mg/dL — ABNORMAL HIGH (ref 70–99)

## 2024-02-19 LAB — MAGNESIUM: Magnesium: 1.9 mg/dL (ref 1.7–2.4)

## 2024-02-19 MED ORDER — POTASSIUM CHLORIDE CRYS ER 20 MEQ PO TBCR
40.0000 meq | EXTENDED_RELEASE_TABLET | Freq: Two times a day (BID) | ORAL | Status: AC
  Administered 2024-02-19 (×2): 40 meq via ORAL
  Filled 2024-02-19 (×2): qty 2

## 2024-02-19 MED ORDER — WARFARIN SODIUM 6 MG PO TABS
6.0000 mg | ORAL_TABLET | Freq: Once | ORAL | Status: AC
  Administered 2024-02-19: 6 mg via ORAL
  Filled 2024-02-19: qty 1

## 2024-02-19 MED ORDER — VANCOMYCIN HCL 1750 MG/350ML IV SOLN: 1750.0000 mg | INTRAVENOUS | Status: DC

## 2024-02-19 NOTE — Plan of Care (Signed)

## 2024-02-19 NOTE — Consult Note (Signed)
 Pharmacy Consult Note - Anticoagulation  Pharmacy Consult for warfarin Indication: CTEPH and atrial fibrillation  PATIENT MEASUREMENTS: Height: 5' 7 (170.2 cm) Weight: (!) 162.1 kg (357 lb 5.9 oz) IBW/kg (Calculated) : 66.1 HEPARIN  DW (KG): 104.5  VITAL SIGNS: Temp: 98.1 F (36.7 C) (06/22 0819) Temp Source: Oral (06/22 0819) BP: 143/76 (06/22 0819) Pulse Rate: 97 (06/22 0819)  Recent Labs    02/17/24 1222 02/18/24 0503 02/19/24 0450 02/19/24 0854  HGB  --    < > 12.2*  --   HCT  --    < > 38.2*  --   PLT  --    < > 151  --   LABPROT  --    < >  --  24.7*  INR  --    < >  --  2.2*  CREATININE  --    < > 1.71*  --   TROPONINIHS 142*  --   --   --    < > = values in this interval not displayed.    Estimated Creatinine Clearance: 73 mL/min (A) (by C-G formula based on SCr of 1.71 mg/dL (H)).  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  Regular the workup has not been forthcoming) previously followed by Dr[facility] Darcy at [facility]   Hepatic steatosis    Hypertension    Morbid obesity with BMI of 50.0-59.9, adult (HCC)    Oxygen deficiency    Peripheral vascular disease (HCC)    Presence of IVC filter    Has 2 IVC filters with significant thrombus burden superior to the filter.   Pulmonary embolus (HCC) 12/2016   Initially just treated with anticoagulation, but in February 2023 in April 2024 treated with EKOS thrombectomy for submassive PE.   Ventricular trigeminy    Weakness of both lower limbs     ASSESSMENT: 53 y.o. 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 Patient is on chronic anticoagulation per chart review. Has been on Xarelto  in the past but is currently on warfarin with INR goal 2.5-3.5. Pharmacy has been consulted to manage warfarin while patient is admitted. INR currently subtherapeutic; no known missed doses however, per wife, medication adherence overall is question. Patient is receiving enoxaparin  until INR is therapeutic x2.  Pertinent medications: Warfarin 6mg  on Tuesday, Thursday, Sunday Warfarin 5mg  all other days Has 5mg  and 1mg  tablets at home Last dose 6/19 at bedtime Total weekly warfarin = 38mg   Goal(s) of therapy: INR 2.5 - 3.5 Monitor platelets by anticoagulation protocol: Yes   Baseline anticoagulation labs: Recent Labs    02/17/24 1037 02/18/24 0503 02/19/24 0450 02/19/24 0854  INR 1.6* 2.0*  --  2.2*  HGB 13.0 11.9* 12.2*  --   PLT 187 147* 151  --       Date:  INR: Dose: 6/20 1.6 8 mg 6/21 2.0 8 mg 6/22 2.2 6 mg ordered   PLAN: Administer warfarin 6mg  PO x 1 today (  home regimen normally 5mg  on Sundays) Continue enoxaparin  1mg /kg q12H until INR is therapeutic x 2 CBC daily   Ryan [name] A Ryan [name], PharmD Clinical Pharmacist 02/19/2024 11:04 AM

## 2024-02-19 NOTE — Progress Notes (Signed)
 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   Evidently started to feel generalized malaise 4-5 days PTA.  Had vomiting, unable to eat and 1-2 loose watery stools per day.  Got weaker and weaker until 6/20, he went to drive to his aunts house to meet his milrinone  infusion HHRN but when he got there, he walked in the house and passed out in a chair, so she called 9-1-1.  EMS found that he had a fever, required 10L O2 to keep sats >90%.   In the ER, CXR showed malpositioned CVC but no infiltrates.  CTA chest showed chronic PE, no new findings, no pulmonary edema.   INR subtherapeutic.  Cr up to 2.  Lactate normal.   No fever here.  Given empiric antibiotics, IV Lasix .  Case discussed with advanced HF team Dr. Gardenia who stated they would see the patient.   Wife gave collateral that patient now lives alone, eats only takeout food and she has concerns that his memory is sometimes impaired and she isn't sure he is taking his medicines.   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  Repeated x-ray for confirmation  IR consulted to check the catheter, possibility of pulled out, IR will check the catheter tomorrow a.m. if it needs to be replaced or not    Acute gastroenteritis secondary to C. difficile and E. Coli C. difficile antigen and PCR toxin positive GI pathogen positive for E. Coli Patient presented with fever, diarrhea and nausea vomiting. DC'd broad-spectrum antibiotics vancomycin  and cefepime  Discussed with ID, recommended to start vancomycin  orally Q6 hourly for 10 days No need to treat E. Coli Started probiotics   # AKI on CKD IIIa Baseline sCr 1.4 sCr 2.0 on admission, sCr 1.96  Bladder scan ruled out urinary retention US  renal: Ruled out hydronephrosis, bladder distention, could not rule out urinary retention, PVR was not done Avoid nephrology medication, your renal dose medication Monitor renal functions and urine output 6/22 RN was advised to do bladder scan every shift   # Metabolic acidosis due to renal failure Bicarb IV 150 mEq given.  Acidosis resolved, bicarbonate is now within normal range. Follow BMP and replete as needed  #Paroxysmal A-fib Currently patient is in sinus Continue Coumadin  and Lovenox  as above  # HTN, HLD, dHF Held Home torsemide  Continue Lasix  as above  # Type 2 diabetes A1c <7% recently.  Diet controlled - SS correction insulin    # Hypokalemia, potassium repleted. Monitor BMP  daily  # Hyponatremia Mild - Diuresis as above and trend   # Iron  deficiency, transferrin saturation 5%, started Venofer  3 mg IV daily x 3 doses followed by oral supplement at discharge.  # Vitamin D  deficiency: started vitamin D  50,000 units p.o. weekly, follow with PCP to repeat vitamin D  level after 3 to 6 months.  # Vitamin B12 deficiency: Started vitamin B12 1000 mcg IM injection  daily during hospital stay, followed by oral supplement.  Follow-up PCP to repeat vitamin B12 level after 3 to 6 months.  # Obstructive sleep apnea, continue CPAP  # Obesity class III, morbid obesity Body mass index is 55.97 kg/m.  Interventions: Calorie restricted diet and daily exercise advised to lose body weight.  Lifestyle modification discussed.   Diet: Heart healthy/carb modified diet DVT Prophylaxis: Subcutaneous Lovenox  and Coumadin   Advance goals of care discussion: Full code  Family Communication: family was present at bedside, at the time of interview.  The pt provided permission to discuss medical plan with the family. Opportunity was given to ask question and all questions were answered satisfactorily.   Disposition:  Pt is from home, admitted with Resp Failure due to CHF and C. difficile/E. coli gastroenteritis, still has Resp Failure, which precludes a safe discharge. Discharge to ome, when stable, may need few days to improve.  Subjective: No significant events overnight, diarrhea is improving, patient had 1 episode of diarrhea today. patient still has significant shortness of breath, denied any chest pain or palpitations   Physical Exam: General: NAD, lying comfortably Appear in no distress, affect appropriate Eyes: PERRLA ENT: Oral Mucosa Clear, moist  Neck: no JVD,  Cardiovascular: S1 and S2 Present, no Murmur,  Respiratory: good respiratory effort, Bilateral Air entry equal and Decreased, no Crackles, no wheezes Abdomen: BS present, Soft, obese and no tenderness,  Skin: no rashes Extremities: 4+ edema, with chronic venous stasis pigmentation  Neurologic: without any new focal findings Gait not checked due to patient safety concerns  Vitals:   02/19/24 0332 02/19/24 0500 02/19/24 0819 02/19/24 1142  BP: 131/77  (!) 143/76 126/67  Pulse: 90  97 100  Resp: 20  20 20   Temp: 98.7 F (37.1 C)  98.1 F (36.7 C) 98.3 F (36.8 C)  TempSrc: Oral  Oral Oral   SpO2: 94%  90% 92%  Weight:  (!) 162.1 kg    Height:        Intake/Output Summary (Last 24 hours) at 02/19/2024 1426 Last data filed at 02/19/2024 0900 Gross per 24 hour  Intake 360 ml  Output 600 ml  Net -240 ml   Filed Weights   02/17/24 1029 02/18/24 0500 02/19/24 0500  Weight: (!) 155.6 kg (!) 157 kg (!) 162.1 kg    Data Reviewed: I have personally reviewed and interpreted daily labs, tele strips, imagings as discussed above. I reviewed all nursing notes, pharmacy notes, vitals, pertinent old records I have discussed plan of care as described above with RN and patient/family.  CBC: Recent Labs  Lab 02/17/24 1037 02/18/24 0503 02/19/24 0450  WBC 6.5 4.4 4.9  NEUTROABS 4.9  --   --   HGB 13.0 11.9* 12.2*  HCT 41.4 37.9* 38.2*  MCV 85.0 85.7 85.3  PLT 187 147* 151   Basic Metabolic Panel: Recent Labs  Lab 02/17/24 1037 02/18/24 0503 02/18/24 1345 02/19/24 0450  NA 132* 132* 133* 134*  K 4.0 3.3* 3.3* 3.1*  CL 101 103 102 102  CO2 22 18* 19* 24  GLUCOSE 115* 103* 127* 108*  BUN 16 21* 21* 20  CREATININE 2.00* 1.96* 1.93* 1.71*  CALCIUM  8.5* 8.2* 8.0* 8.0*  MG  --   --  1.9 1.9  PHOS  --   --  2.5 3.1    Studies: No results found.   Scheduled Meds:  Chlorhexidine  Gluconate Cloth  6 each Topical Daily   cyanocobalamin   1,000 mcg Intramuscular Daily   Followed by   NOREEN ON 02/25/2024] vitamin B-12  1,000 mcg Oral Daily   enoxaparin  (LOVENOX ) injection  150 mg Subcutaneous Q12H   furosemide   80 mg Intravenous BID   insulin  aspart  0-5 Units Subcutaneous QHS   insulin  aspart  0-9 Units Subcutaneous TID WC   multivitamin with minerals  1 tablet Oral Daily   pantoprazole   40 mg Oral Daily   potassium chloride   40 mEq Oral BID   Riociguat   2.5 mg Oral TID   saccharomyces boulardii  250 mg Oral BID   vancomycin   125 mg Oral QID   Vitamin D  (Ergocalciferol )  50,000 Units Oral Q7 days   warfarin  6 mg Oral ONCE-1600   Warfarin - Pharmacist Dosing  Inpatient   Does not apply q1600   Continuous Infusions:  iron  sucrose 300 mg (02/18/24 1740)   milrinone  0.375 mcg/kg/min (02/19/24 1319)   vancomycin      PRN Meds: acetaminophen  **OR** acetaminophen , albuterol , ondansetron  **OR** ondansetron  (ZOFRAN ) IV, oxyCODONE   Time spent: 40 minutes  Author: ELVAN SOR. MD [facility] 02/19/2024 2:26 PM  To reach On-call, see care teams to locate the attending and reach out to them via [email or web address]. If 7PM-7AM, please contact night-coverage If you still have difficulty reaching the attending provider, please page the [facility] (Director on Call) for [facility] on amion for assistance.

## 2024-02-20 ENCOUNTER — Inpatient Hospital Stay

## 2024-02-20 ENCOUNTER — Other Ambulatory Visit (HOSPITAL_COMMUNITY): Payer: Self-pay

## 2024-02-20 ENCOUNTER — Inpatient Hospital Stay: Admitting: Radiology

## 2024-02-20 ENCOUNTER — Telehealth (HOSPITAL_COMMUNITY): Payer: Self-pay | Admitting: Pharmacy Technician

## 2024-02-20 DIAGNOSIS — I272 Pulmonary hypertension, unspecified: Secondary | ICD-10-CM | POA: Diagnosis not present

## 2024-02-20 DIAGNOSIS — Z789 Other specified health status: Secondary | ICD-10-CM | POA: Diagnosis not present

## 2024-02-20 DIAGNOSIS — Z7189 Other specified counseling: Secondary | ICD-10-CM | POA: Diagnosis not present

## 2024-02-20 DIAGNOSIS — Z515 Encounter for palliative care: Secondary | ICD-10-CM | POA: Diagnosis not present

## 2024-02-20 DIAGNOSIS — G4733 Obstructive sleep apnea (adult) (pediatric): Secondary | ICD-10-CM | POA: Diagnosis not present

## 2024-02-20 DIAGNOSIS — I50810 Right heart failure, unspecified: Secondary | ICD-10-CM | POA: Diagnosis not present

## 2024-02-20 DIAGNOSIS — J9601 Acute respiratory failure with hypoxia: Secondary | ICD-10-CM | POA: Diagnosis not present

## 2024-02-20 DIAGNOSIS — I2781 Cor pulmonale (chronic): Secondary | ICD-10-CM | POA: Diagnosis not present

## 2024-02-20 HISTORY — PX: IR FLUORO GUIDE CV LINE RIGHT: IMG2283

## 2024-02-20 LAB — BASIC METABOLIC PANEL WITH GFR
Anion gap: 8 (ref 5–15)
BUN: 19 mg/dL (ref 6–20)
CO2: 24 mmol/L (ref 22–32)
Calcium: 8.1 mg/dL — ABNORMAL LOW (ref 8.9–10.3)
Chloride: 104 mmol/L (ref 98–111)
Creatinine, Ser: 1.48 mg/dL — ABNORMAL HIGH (ref 0.61–1.24)
GFR, Estimated: 56 mL/min — ABNORMAL LOW (ref >60)
Glucose, Bld: 105 mg/dL — ABNORMAL HIGH (ref 70–99)
Potassium: 3.4 mmol/L — ABNORMAL LOW (ref 3.5–5.1)
Sodium: 136 mmol/L (ref 135–145)

## 2024-02-20 LAB — URINALYSIS, ROUTINE W REFLEX MICROSCOPIC
Bilirubin Urine: NEGATIVE
Glucose, UA: NEGATIVE mg/dL
Ketones, ur: NEGATIVE mg/dL
Leukocytes,Ua: NEGATIVE
Nitrite: NEGATIVE
Protein, ur: 30 mg/dL — AB
Specific Gravity, Urine: 1.012 (ref 1.005–1.030)
pH: 5 (ref 5.0–8.0)

## 2024-02-20 LAB — COOXEMETRY PANEL
Carboxyhemoglobin: 0.5 % (ref 0.5–1.5)
Methemoglobin: 0.7 % (ref 0.0–1.5)
O2 Saturation: 57.8 %
Total hemoglobin: 13.1 g/dL (ref 12.0–16.0)
Total oxygen content: 57.1 %

## 2024-02-20 LAB — MAGNESIUM: Magnesium: 1.9 mg/dL (ref 1.7–2.4)

## 2024-02-20 LAB — GLUCOSE, CAPILLARY
Glucose-Capillary: 100 mg/dL — ABNORMAL HIGH (ref 70–99)
Glucose-Capillary: 110 mg/dL — ABNORMAL HIGH (ref 70–99)
Glucose-Capillary: 111 mg/dL — ABNORMAL HIGH (ref 70–99)
Glucose-Capillary: 119 mg/dL — ABNORMAL HIGH (ref 70–99)
Glucose-Capillary: 119 mg/dL — ABNORMAL HIGH (ref 70–99)

## 2024-02-20 LAB — CBC
HCT: 39 % (ref 39.0–52.0)
Hemoglobin: 12.1 g/dL — ABNORMAL LOW (ref 13.0–17.0)
MCH: 26.2 pg (ref 26.0–34.0)
MCHC: 31 g/dL (ref 30.0–36.0)
MCV: 84.4 fL (ref 80.0–100.0)
Platelets: 188 10*3/uL (ref 150–400)
RBC: 4.62 MIL/uL (ref 4.22–5.81)
RDW: 16.1 % — ABNORMAL HIGH (ref 11.5–15.5)
WBC: 4.8 10*3/uL (ref 4.0–10.5)
nRBC: 0 % (ref 0.0–0.2)

## 2024-02-20 LAB — BLOOD GAS, VENOUS
pH, Ven: 7.41 (ref 7.25–7.43)
pO2, Ven: 24.1 mmol/L (ref 32–45)

## 2024-02-20 LAB — PROTIME-INR
INR: 2 — ABNORMAL HIGH (ref 0.8–1.2)
Prothrombin Time: 22.7 s — ABNORMAL HIGH (ref 11.4–15.2)

## 2024-02-20 LAB — PHOSPHORUS: Phosphorus: 3 mg/dL (ref 2.5–4.6)

## 2024-02-20 MED ORDER — METOLAZONE 5 MG PO TABS
5.0000 mg | ORAL_TABLET | Freq: Once | ORAL | Status: AC
  Administered 2024-02-20: 5 mg via ORAL
  Filled 2024-02-20: qty 1

## 2024-02-20 MED ORDER — WARFARIN SODIUM 4 MG PO TABS
8.0000 mg | ORAL_TABLET | Freq: Every day | ORAL | Status: DC
  Filled 2024-02-20: qty 2

## 2024-02-20 MED ORDER — LIDOCAINE HCL 1 % IJ SOLN
INTRAMUSCULAR | Status: AC
Start: 2024-02-20 — End: 2024-02-20
  Filled 2024-02-20: qty 20

## 2024-02-20 MED ORDER — FUROSEMIDE 10 MG/ML IJ SOLN
80.0000 mg | Freq: Once | INTRAMUSCULAR | Status: AC
  Administered 2024-02-20: 80 mg via INTRAVENOUS
  Filled 2024-02-20: qty 8

## 2024-02-20 MED ORDER — SPIRONOLACTONE 25 MG PO TABS
25.0000 mg | ORAL_TABLET | Freq: Every day | ORAL | Status: DC
  Administered 2024-02-20: 25 mg via ORAL
  Filled 2024-02-20: qty 1

## 2024-02-20 MED ORDER — WARFARIN SODIUM 4 MG PO TABS
8.0000 mg | ORAL_TABLET | Freq: Every day | ORAL | Status: DC
  Administered 2024-02-20: 8 mg via ORAL
  Filled 2024-02-20 (×2): qty 2

## 2024-02-20 MED ORDER — CEFAZOLIN SODIUM-DEXTROSE 2-4 GM/100ML-% IV SOLN
2.0000 g | INTRAVENOUS | Status: AC
  Filled 2024-02-20: qty 100

## 2024-02-20 MED ORDER — POTASSIUM CHLORIDE CRYS ER 20 MEQ PO TBCR
40.0000 meq | EXTENDED_RELEASE_TABLET | ORAL | Status: DC
  Administered 2024-02-20: 40 meq via ORAL
  Filled 2024-02-20: qty 2

## 2024-02-20 NOTE — Progress Notes (Addendum)
 Advanced Heart Failure Rounding Note  Cardiologist: Dr. Zenaida   Chief Complaint:  Dyspnea    Patient Profile   Patient is a chronically ill 53 year old male with extensive family history of clotting disorders. 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 Per chart review, has MTHFR gene mutation and was previously followed by Hematology (Dr. Darcy). There has been some concern for poor compliance with anticoagulation. Hypercoagulable workup has been unrevealing. 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  Multiple hospitalizations in the past several months.  9/24: admitted w/ recurrent PE s/p mechanical thrombectomy  10/24: readmitted w/ a/c respiratory failure. CTA showed persistent nonocclusive thrombus in the right main pulmonary artery as well as new small left upper lobe segmental PE  12/24: Readmitted in 12/24 with worsening dyspnea d/t acute on chronic RV failure. CTA with resolution of prior left-sided PE and stable chronic right-sided PE   Now readmitted w/ a/c hypoxic respiratory failure and a/c RV failure + acute C-diff infection  Admission CTA of Chest showed no acute PE. Re demonstrated findings for chronic PE w/ evidence of CTEPH. Admit INR subtherapeutic at 1.6.     Pertinent Studies:    RHC + Pulmonary Angiography (Prior to this admission) 12/16/23  HEMODYNAMICS: RA:       13 mmHg (mean) RV:       110/4, 13 mmHg PA:       110/36 mmHg (60 mean) PCWP: 12 mmHg (mean)                                      Estimated Fick CO/CI   5.07L/min, 1.96L/min/m2       Thermodilution CO/CI   5.15L/min, 1.99L/min/m2                                      TPG  48  mmHg                                               PVR  9.3 Wood Units  PAPi  5.7       IMPRESSION: Right heart catheterization and pulmonary  angiography for evaluation of PH while on 0.25mcg/kg/min of milrinone  Severe pulmonary hypertension with mild improvement in PVR since last RHC Improved cardiac output by thermodilution Mildly elevated right, normal left sided filling pressures Selective pulmonary angiography with evidence of disease in the left PA, segmental and subsegmental in anteromedial basal territory, likely segmental disease in the right upper lobe. Images somewhat limited by camera acquisition and body habitus.  Interval Hx/ Subjective:    Milrinone  increased to 0.375 on admit. LA cleared, 2.0>>1.3.   I/Os incomplete, several unmeasured urinary occurrences yesterday, but pt notes UOP is not robust. Says urine is dark. No current CVP set up.   AKI improving, SCr 2.0>>1.48 today. K 3.4   On Vanc +cefazolin  for C-diff. Initial BCx + staph epi (likely contaminate). Repeat BCx NGTD.   He says breathing has improved some, but still on 7 L Kimberly (home baseline 2L PRN). C/w diarrhea and abdominal cramping.    Objective:  Weight Range: (!) 166.9 kg Body mass index is 57.63 kg/m.   Vital Signs:   Temp:  [98.1 F (36.7 C)-98.4 F (36.9 C)] 98.3 F (36.8 C) (06/23 0543) Pulse Rate:  [89-100] 89 (06/23 0543) Resp:  [20] 20 (06/23 0543) BP: (120-143)/(65-78) 120/65 (06/23 0543) SpO2:  [90 %-93 %] 93 % (06/23 0543) Weight:  [166.9 kg] 166.9 kg (06/23 0355) Last BM Date : 02/18/24  Weight change: Filed Weights   02/18/24 0500 02/19/24 0500 02/20/24 0355  Weight: (!) 157 kg (!) 162.1 kg (!) 166.9 kg    Intake/Output:   Intake/Output Summary (Last 24 hours) at 02/20/2024 0737 Last data filed at 02/20/2024 0547 Gross per 24 hour  Intake 370 ml  Output 1450 ml  Net -1080 ml      Physical Exam    General:  chronically ill appearing, obese. No resp difficulty HEENT: Normal Neck: Supple. JVP elevated to jaw. Cor: PMI nondisplaced. Regular rhythm and tachy rate. + rt internal jugular tunneled picc  Lungs:  decreased BS at the bases bilaterally  Abdomen: Obese, nontender, nondistended.  Extremities: obese extremities, + chronic b/l venous stasis dermatitis  Neuro: Alert & orientedx3, cranial nerves grossly intact. moves all 4 extremities w/o difficulty. Affect pleasant   Telemetry   Sinus tach low 100s, personally reviewed   EKG    N/A   Labs    CBC Recent Labs    02/17/24 1037 02/18/24 0503 02/19/24 0450 02/20/24 0248  WBC 6.5   < > 4.9 4.8  NEUTROABS 4.9  --   --   --   HGB 13.0   < > 12.2* 12.1*  HCT 41.4   < > 38.2* 39.0  MCV 85.0   < > 85.3 84.4  PLT 187   < > 151 188   < > = values in this interval not displayed.   Basic Metabolic Panel Recent Labs    93/77/74 0450 02/20/24 0248  NA 134* 136  K 3.1* 3.4*  CL 102 104  CO2 24 24  GLUCOSE 108* 105*  BUN 20 19  CREATININE 1.71* 1.48*  CALCIUM  8.0* 8.1*  MG 1.9 1.9  PHOS 3.1 3.0   Liver Function Tests Recent Labs    02/17/24 1037  AST 27  ALT 16  ALKPHOS 45  BILITOT 1.1  PROT 8.0  ALBUMIN  3.5   No results for input(s): LIPASE, AMYLASE in the last 72 hours. Cardiac Enzymes No results for input(s): CKTOTAL, CKMB, CKMBINDEX, TROPONINI in the last 72 hours.  BNP: BNP (last 3 results) Recent Labs    09/19/23 2216 10/24/23 1410 02/17/24 1037  BNP 280.4* 84.4 424.2*    ProBNP (last 3 results) No results for input(s): PROBNP in the last 8760 hours.   D-Dimer No results for input(s): DDIMER in the last 72 hours. Hemoglobin A1C No results for input(s): HGBA1C in the last 72 hours. Fasting Lipid Panel No results for input(s): CHOL, HDL, LDLCALC, TRIG, CHOLHDL, LDLDIRECT in the last 72 hours. Thyroid  Function Tests No results for input(s): TSH, T4TOTAL, T3FREE, THYROIDAB in the last 72 hours.  Invalid input(s): FREET3  Other results:   Imaging    No results found.   Medications:     Scheduled Medications:  Chlorhexidine  Gluconate Cloth  6  each Topical Daily   cyanocobalamin   1,000 mcg Intramuscular Daily   Followed by   NOREEN ON 02/25/2024] vitamin B-12  1,000 mcg Oral Daily   enoxaparin  (LOVENOX ) injection  150 mg Subcutaneous Q12H  furosemide   80 mg Intravenous BID   insulin  aspart  0-5 Units Subcutaneous QHS   insulin  aspart  0-9 Units Subcutaneous TID WC   multivitamin with minerals  1 tablet Oral Daily   pantoprazole   40 mg Oral Daily   Riociguat   2.5 mg Oral TID   saccharomyces boulardii  250 mg Oral BID   vancomycin   125 mg Oral QID   Vitamin D  (Ergocalciferol )  50,000 Units Oral Q7 days   Warfarin - Pharmacist Dosing Inpatient   Does not apply q1600    Infusions:  iron  sucrose 300 mg (02/19/24 1717)   milrinone  0.375 mcg/kg/min (02/20/24 0306)    PRN Medications: acetaminophen  **OR** acetaminophen , albuterol , ondansetron  **OR** ondansetron  (ZOFRAN ) IV, oxyCODONE    Assessment/Plan   Acute on chronic RV Failure 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 Initial LA 2.0>>1.1>>1.3. Milrinone  increased   - Continue Milrinone  0.375 mcg/kg/min - Continue Adempas  2.5mg   - Continue IV Lasix  80 mg bid  - volume assessment difficult given body habitus. C/w O2 requirements above baseline and sup par urinary response to IV Lasix , per pt report.   - set up CVP monitoring to help guide diuretic dosing - check co-ox   - INR remains subtherapeutic @ 2.0 (Goal 2.5-3.5). Continue Lovenox  bridge. PharmD dosing coumadin     2. Recurrent DVT/PE - Chronic, stable PE on CTA this admit.  - INR subtherapeutic on  admission, 1.6  - Coumadin  + Lovenox  bridge per pharmD    3. Atrial fibrillation/flutter - currently in sinus tach, monitor w/ milrinone  - on Coumadin     4. AKI on CKD IIIa - b/l SCr ~1.5-1.7 - SCr  elevated at 2.0 on admit - likely cardiorenal - improving w/ diuresis and increased milrinone  support - SCr down to 1.48 today   - follow BMP   5. C-Diff Infection  - GI panel + for Ecoli  - Initial BCx + for Staph Epi (likely contaminant). Repeat BCx NGTD x 3 days   - on Vanc + cefazolin  - on saccharomyces boulardii bid   6. Hypokalemia  - K 3.4 w/ diuresis  - give K supp   Length of Stay: 3  Caffie Marcine RIGGERS  02/20/2024, 7:37 AM  Advanced Heart Failure Team Pager [phone] (M-F; 7a - 5p)  Please contact [facility] for night-coverage after hours (5p -7a ) and weekends on [email or web address]

## 2024-02-20 NOTE — Progress Notes (Signed)
 [facility] Progress Note  Patient: Ryan [name]    FMW:[id number]  DOA: 02/17/2024     Date of Service: the patient was seen and examined on 02/20/2024  Chief Complaint  Patient presents with   Shortness of Breath   Brief hospital course: 53 y.o. M with CTEPH and pHTN on milrinone , dCHF, chronic respiratory failure on 2L, HTN, DM, CKD IIIa baseline 1.4, hx IVC filter, on warfarin due to Eliquis /Xarelto  failures, and MO who presented with cough, worsening SOB now at rest and syncope.   Evidently started to feel generalized malaise 4-5 days PTA.  Had vomiting, unable to eat and 1-2 loose watery stools per day.  Got weaker and weaker until 6/20, he went to drive to his aunts house to meet his milrinone  infusion HHRN but when he got there, he walked in the house and passed out in a chair, so she called 9-1-1.  EMS found that he had a fever, required 10L O2 to keep sats >90%.   In the ER, CXR showed malpositioned CVC but no infiltrates.  CTA chest showed chronic PE, no new findings, no pulmonary edema.   INR subtherapeutic.  Cr up to 2.  Lactate normal.   No fever here.  Given empiric antibiotics, IV Lasix .  Case discussed with advanced HF team Dr. Gardenia who stated they would see the patient.   Wife gave collateral that patient now lives alone, eats only takeout food and she has concerns that his memory is sometimes impaired and she isn't sure he is taking his medicines.   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  Repeated x-ray for confirmation  IR consulted, 25cm DL tunneled PICC line exchange was done on 6/23 Due to continuous CBC monitoring patient was transferred to stepdown on 6/23  6/23 respiratory failure deteriorated, follow repeat chest x-ray   Acute gastroenteritis secondary to C. difficile and E. Coli C. difficile antigen and PCR toxin positive GI pathogen positive for E. Coli Patient presented with fever, diarrhea and nausea vomiting. DC'd broad-spectrum antibiotics vancomycin  and cefepime  Discussed with ID, recommended to start vancomycin  orally Q6 hourly for 10 days No need to treat E. Coli Started probiotics   # AKI on CKD IIIa Baseline sCr 1.4 sCr 2.0 on admission, sCr 1.96 --1.48 Bladder scan ruled out urinary retention US  renal: Ruled out hydronephrosis, bladder distention, could not rule out urinary retention, PVR was not done Avoid nephrology medication, your renal dose medication Monitor renal functions and urine output 6/22 RN was advised to do bladder scan every shift   # Metabolic acidosis due to renal failure Bicarb IV 150 mEq given.  Acidosis resolved, bicarbonate is now within normal range. Follow BMP and replete as needed  #Paroxysmal A-fib Currently patient is in sinus Continue Coumadin  and Lovenox  as above  # HTN, HLD, dHF Held Home torsemide  Continue Lasix  as above  # Type 2 diabetes A1c <7% recently.  Diet controlled - SS correction insulin    #  Hypokalemia, potassium repleted. Monitor BMP daily  # Hyponatremia Mild - Diuresis as above and trend   # Iron  deficiency, transferrin saturation 5%, started Venofer  3 mg IV daily x 3 doses followed by oral supplement at discharge.  # Vitamin D  deficiency: started vitamin D  50,000 units p.o. weekly, follow with PCP to repeat vitamin D  level after 3 to 6  months.  # Vitamin B12 deficiency: Started vitamin B12 1000 mcg IM injection daily during hospital stay, followed by oral supplement.  Follow-up PCP to repeat vitamin B12 level after 3 to 6 months.  # Obstructive sleep apnea, continue CPAP  # Obesity class III, morbid obesity Body mass index is 57.63 kg/m.  Interventions: Calorie restricted diet and daily exercise advised to lose body weight.  Lifestyle modification discussed.   Diet: Heart healthy/carb modified diet DVT Prophylaxis: Subcutaneous Lovenox  and Coumadin   Advance goals of care discussion: Full code  Family Communication: family was present at bedside, at the time of interview.  The pt provided permission to discuss medical plan with the family. Opportunity was given to ask question and all questions were answered satisfactorily.   Disposition:  Pt is from home, admitted with Resp Failure due to CHF and C. difficile/E. coli gastroenteritis, still has Resp Failure, which precludes a safe discharge. Discharge to ome, when stable, may need few days to improve.  Subjective: No significant events overnight, patient had direct 2 episodes today morning, 1 episode whole day yesterday, denies any nausea vomiting.  No abdominal pain.  Still has significant shortness of breath. Patient was seen just before the PICC line exchange, he was shortly taken to IR for catheter replacement. Patient was moved to stepdown unit due to continuous CVP monitoring as per cardiology   Physical Exam: General: NAD, lying comfortably Appear in no distress, affect appropriate Eyes: PERRLA ENT: Oral Mucosa Clear, moist  Neck: no JVD,  Cardiovascular: S1 and S2 Present, no Murmur,  Respiratory: good respiratory effort, Bilateral Air entry equal and Decreased, no Crackles, no wheezes Abdomen: BS present, Soft, obese and no tenderness,  Skin: no rashes Extremities: 4+ edema, with chronic venous stasis pigmentation  Neurologic: without any new focal  findings Gait not checked due to patient safety concerns  Vitals:   02/20/24 1500 02/20/24 1530 02/20/24 1554 02/20/24 1600  BP: 111/68   110/66  Pulse: 85 82  89  Resp: (!) 23 17  20   Temp:   98.4 F (36.9 C)   TempSrc:   Oral   SpO2: 92% 96%  92%  Weight:      Height:        Intake/Output Summary (Last 24 hours) at 02/20/2024 1627 Last data filed at 02/20/2024 1620 Gross per 24 hour  Intake 1872.55 ml  Output 650 ml  Net 1222.55 ml   Filed Weights   02/18/24 0500 02/19/24 0500 02/20/24 0355  Weight: (!) 157 kg (!) 162.1 kg (!) 166.9 kg    Data Reviewed: I have personally reviewed and interpreted daily labs, tele strips, imagings as discussed above. I reviewed all nursing notes, pharmacy notes, vitals, pertinent old records I have discussed plan of care as described above with RN and patient/family.  CBC: Recent Labs  Lab 02/17/24 1037 02/18/24 0503 02/19/24 0450 02/20/24 0248  WBC 6.5 4.4 4.9 4.8  NEUTROABS 4.9  --   --   --   HGB 13.0 11.9* 12.2* 12.1*  HCT 41.4 37.9* 38.2* 39.0  MCV 85.0 85.7 85.3 84.4  PLT 187 147* 151  188   Basic Metabolic Panel: Recent Labs  Lab 02/17/24 1037 02/18/24 0503 02/18/24 1345 02/19/24 0450 02/20/24 0248  NA 132* 132* 133* 134* 136  K 4.0 3.3* 3.3* 3.1* 3.4*  CL 101 103 102 102 104  CO2 22 18* 19* 24 24  GLUCOSE 115* 103* 127* 108* 105*  BUN 16 21* 21* 20 19  CREATININE 2.00* 1.96* 1.93* 1.71* 1.48*  CALCIUM  8.5* 8.2* 8.0* 8.0* 8.1*  MG  --   --  1.9 1.9 1.9  PHOS  --   --  2.5 3.1 3.0    Studies: IR Fluoro Guide CV Line Right Result Date: 02/20/2024 INDICATION: Partially retracted tunneled dual lumen PICC line in the right IJ. Cough is been exposed. Planned exchange for a new working tunneled PICC line. EXAM: Venous access exchange MEDICATIONS: Ancef  3 gm IV; The antibiotic was administered within an appropriate time interval prior to skin puncture. ANESTHESIA/SEDATION: Moderate (conscious) sedation was employed  during this procedure. A total of Versed  0 mg and Fentanyl  0 mcg was administered intravenously by the radiology nurse. Total intra-service moderate Sedation Time: 0 minutes. The patient's level of consciousness and vital signs were monitored continuously by radiology nursing throughout the procedure under my direct supervision. FLUOROSCOPY: Radiation Exposure Index (as provided by the fluoroscopic device): 0.1 minute 1 mGy Kerma COMPLICATIONS: None immediate. PROCEDURE: Informed written consent was obtained from the patient after a thorough discussion of the procedural risks, benefits and alternatives. All questions were addressed. Maximal Sterile Barrier Technique was utilized including caps, mask, sterile gowns, sterile gloves, sterile drape, hand hygiene and skin antiseptic. A timeout was performed prior to the initiation of the procedure. Initial fluoroscopic images of the patient in a supine position demonstrated the catheter to be partially retracted within the venous system. One of the 2 lumens in this dual lumen PICC line would not aspirate. The retention suture and catheter were cut. An 018 guidewire then advanced through the remaining PICC line and the PICC line was removed from the patient. The PICC line was examined and had a length of 25 cm. The new dual lumen tunneled PICC line was cut to 25 cm and advanced over the guidewire under fluoroscopic guidance until the catheter tip was at the cavoatrial junction. The guidewire was removed. Retention suture applied. The catheter was tested for function and found to be functioning well. Both lumens were then capped. IMPRESSION: Successful fluoroscopic guided exchange of a tunneled dual lumen PICC line the right IJ as described above. Electronically Signed   By: Cordella Banner   On: 02/20/2024 13:12     Scheduled Meds:  Chlorhexidine  Gluconate Cloth  6 each Topical Daily   cyanocobalamin   1,000 mcg Intramuscular Daily   Followed by   NOREEN ON  02/25/2024] vitamin B-12  1,000 mcg Oral Daily   enoxaparin  (LOVENOX ) injection  150 mg Subcutaneous Q12H   furosemide   80 mg Intravenous BID   insulin  aspart  0-5 Units Subcutaneous QHS   insulin  aspart  0-9 Units Subcutaneous TID WC   multivitamin with minerals  1 tablet Oral Daily   pantoprazole   40 mg Oral Daily   Riociguat   2.5 mg Oral TID   saccharomyces boulardii  250 mg Oral BID   spironolactone   25 mg Oral Daily   vancomycin   125 mg Oral QID   Vitamin D  (Ergocalciferol )  50,000 Units Oral Q7 days   warfarin  8 mg Oral q1600   Warfarin - Pharmacist Dosing Inpatient   Does  not apply q1600   Continuous Infusions:   ceFAZolin  (ANCEF ) IV     iron  sucrose 176.7 mL/hr at 02/20/24 1620   milrinone  0.375 mcg/kg/min (02/20/24 1620)   PRN Meds: acetaminophen  **OR** acetaminophen , albuterol , ondansetron  **OR** ondansetron  (ZOFRAN ) IV, oxyCODONE   Time spent: 55 minutes  Author: ELVAN SOR. MD [facility] 02/20/2024 4:27 PM  To reach On-call, see care teams to locate the attending and reach out to them via [email or web address]. If 7PM-7AM, please contact night-coverage If you still have difficulty reaching the attending provider, please page the [facility] (Director on Call) for [facility] on amion for assistance.

## 2024-02-20 NOTE — Consult Note (Signed)
 Pharmacy Consult Note - Anticoagulation  Pharmacy Consult for warfarin Indication: CTEPH and atrial fibrillation  PATIENT MEASUREMENTS: Height: 5' 7 (170.2 cm) Weight: (!) 166.9 kg (367 lb 15.2 oz) IBW/kg (Calculated) : 66.1 HEPARIN  DW (KG): 104.5  VITAL SIGNS: Temp: 98.5 F (36.9 C) (06/23 0821) Temp Source: Oral (06/23 0543) BP: 131/76 (06/23 0821) Pulse Rate: 92 (06/23 0821)  Recent Labs    02/17/24 1222 02/18/24 0503 02/20/24 0248  HGB  --    < > 12.1*  HCT  --    < > 39.0  PLT  --    < > 188  LABPROT  --    < > 22.7*  INR  --    < > 2.0*  CREATININE  --    < > 1.48*  TROPONINIHS 142*  --   --    < > = values in this interval not displayed.    Estimated Creatinine Clearance: 85.9 mL/min (A) (by C-G formula based on SCr of 1.48 mg/dL (H)).  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  Regular the workup has not been forthcoming) previously followed by Dr. Darcy at [facility]   Hepatic steatosis    Hypertension    Morbid obesity with BMI of 50.0-59.9, adult (HCC)    Oxygen deficiency    Peripheral vascular disease (HCC)    Presence of IVC filter    Has 2 IVC filters with significant thrombus burden superior to the filter.   Pulmonary embolus (HCC) 12/2016   Initially just treated with anticoagulation, but in February 2023 in April 2024 treated with EKOS thrombectomy for submassive PE.   Ventricular trigeminy    Weakness of both lower limbs     ASSESSMENT: 53 y.o. male with PMH including Afib, CTEPH, recurrent PE and extensive VTE history (>20 events; follows with  hematology) is presenting with fever and suspected cor pulmonale. Patient is on chronic anticoagulation per chart review. Has been on Xarelto  in the past but is currently on warfarin with INR goal 2.5-3.5. Pharmacy has been consulted to manage warfarin while patient is admitted. INR currently subtherapeutic; no known missed doses however, per wife, medication adherence overall is question. Patient is receiving enoxaparin  until INR is therapeutic.  Pertinent medications: Warfarin 6mg  on Tuesday, Thursday, Sunday Warfarin 5mg  all other days Has 5mg  and 1mg  tablets at home Last dose 6/19 at bedtime Total weekly warfarin = 38mg   Goal(s) of therapy: INR 2.5 - 3.5 Monitor platelets by anticoagulation protocol: Yes   Date:  INR: Dose: 6/20 1.6 8 mg 6/21 2.0 8 mg 6/22 2.2 6 mg ordered 6/23 2.0 8 mg daily ordered   PLAN: Start warfarin 8mg  po qPM for now Enoxaparin  last dose today's evening dose. INR >/= 2 x 3.  INR and CBC with AM labs  Narada Uzzle Rodriguez-Guzman PharmD, BCPS 02/20/2024 9:50 AM

## 2024-02-20 NOTE — Consult Note (Signed)
 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 With worsening oxygen requirements over the past 12 hours, PCCM is consulted for further input.  Patient is followed closely by my colleagues Dr. Malka in pulmonary clinic and Dr. Zenaida in  advanced heart failure clinic. He has had a history of recurrent DVT's and PET, starting at age 67, and previously requiring multiple IVC filters. He has failed multiple agents for anti-coagulation, and is currently on warfarin.  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 His CT angiograms have shown signs of CTEPH, with bulky chronic clot. He underwent mechanical thrombectomy multiple times. His most recent thrombectomy procedure was in September of 2024. He is maintained on IV milrinone  outpatient, in addition to PO Riociguat  and PO torsemide . He is anticoagulated with Warfarin.   RHC in April of 2024 (on 0.25 mcg/kg/min of milrinone ) showed RA 13, RV 110/4, PA 110/36 (60), PAOP 12 mmHg. TPG 48, PVR 9.3 Wood, PAPi 5.7; CO/CI by FI was 5.07/1.96.  He presented on 02/17/2024 with diarrhea, malaise and weakness for a few days prior to presentation. On EMS activation, he was found to be significantly hypoxic, with significant increase in his oxygen requirements (usually 2-3 L via [place] at home). A CTA of the chest in the ED did not show acute PE, but did note his chronic clots. Blood work showed AKI on CKD, and BNP was increased compared to baseline. His procalcitonin was mildly  increased. Lactic acid was initially borderline at 2, and improved after IV diuresis and increase in his milrinone  dose. Stool studies showed C. Diff colitis.  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 rate (!) 26, height 5' 7 (1.702 m), weight (!) 166.9 kg, SpO2 90%. CVP:  [12 mmHg-19 mmHg] 18 mmHg      Intake/Output Summary (Last 24 hours) at 02/20/2024 1735 Last data filed at 02/20/2024 1620 Gross per 24 hour  Intake 1872.55 ml  Output 650 ml  Net 1222.55 ml   Filed Weights   02/18/24 0500 02/19/24 0500 02/20/24 0355  Weight: (!) 157 kg (!) 162.1 kg (!) 166.9 kg    Examination: Physical Exam Constitutional:      Appearance: He is obese. He is ill-appearing.   Cardiovascular:     Rate and Rhythm: Regular rhythm. Tachycardia present.     Pulses: Normal pulses.     Heart sounds: Normal heart sounds.  Pulmonary:     Effort: Pulmonary effort is normal.     Breath sounds: No wheezing or rales.  Abdominal:     General: There is distension.   Musculoskeletal:  Right lower leg: Edema present.     Left lower leg: Edema present.   Neurological:     Mental Status: He is alert.     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 This is most likely secondary to progressive pulmonary HTN with worsening RV dysfunction. His course is complicated by C. Diff colitis for which he is being treated with PO vancomycin .  His oxygen requirements have worsened from his baseline 2-3 L, suggesting worsening pulm HTN and RV dysfunction. TTE in January of 2025 had shown mildly reduced RV systolic function and his last RHC showed severe pulmonary hypertension with a mPAP of 60 mmHg. He is currently on an infusion of milrinone  at 0.375 mcg, 80 mg of IV furosemide  twice daily, Riociguat  2.5 mg tid, and spironolactone  25 mg daily.  He has not had a co-ox panel over the weekend, and I would recommend checking it (central venous sat) twice daily as a surrogate for cardiac output. Will also repeat a limited echocardiogram to re-evaluate RV function. He did have a split night sleep study, which showed severe OSA. He was unable to tolerate BiPAP during the study, but auto-BIPAP was ordered. He was to see an advanced sleep medicine specialist at [facility] for further management of his sleep disordered breathing and has yet to initiate NIPPV. He is being seen and followed by advanced heart failure who continue to titrate his ionotropic support.  Ultimately, he needs definitive treatment for CTEPH. He was referred to CTEPH clinic at [facility] (reports appointment in July) for consideration of pulmonary endarterectomy vs pulmonary balloon angioplasty vs lung/heart transplant.  Finally, the constellation of RV failure and severe pulmonary hypertension puts him at significantly increase risk of death should his condition worsen and he require mechanical ventilation. My main concern is pre-load collapse with RSI induction resulting in cardiac arrest. Positive pressure from mechanical ventilation will similarly result in cardiac collapse and arrest secondary to increased PVR and worsening RV function. I would recommend a consultation with  palliative care to tackle goals of care.  -consider TTE -co-ox panel bid -continue IV diuresis, IV milrinone , and PO Riociguat  -appreciate input from advanced heart failure -management of C. Diff per ID  Belva November, MD [facility] 02/20/2024 6:08 PM    Labs   CBC: Recent Labs  Lab 02/17/24 1037 02/18/24 0503 02/19/24 0450 02/20/24 0248  WBC 6.5 4.4 4.9 4.8  NEUTROABS 4.9  --   --   --   HGB 13.0 11.9* 12.2* 12.1*  HCT 41.4 37.9* 38.2* 39.0  MCV 85.0 85.7 85.3 84.4  PLT 187 147* 151 188    Basic Metabolic Panel: Recent Labs  Lab 02/17/24 1037 02/18/24 0503 02/18/24 1345 02/19/24 0450 02/20/24 0248  NA 132* 132* 133* 134* 136  K 4.0 3.3* 3.3* 3.1* 3.4*  CL 101 103 102 102 104  CO2 22 18* 19* 24 24  GLUCOSE 115* 103* 127* 108* 105*  BUN 16 21* 21* 20 19  CREATININE 2.00* 1.96* 1.93* 1.71* 1.48*  CALCIUM  8.5* 8.2* 8.0* 8.0* 8.1*  MG  --   --  1.9 1.9 1.9  PHOS  --   --  2.5 3.1 3.0   GFR: Estimated Creatinine Clearance: 85.9 mL/min (A) (by C-G formula based on SCr of 1.48 mg/dL (H)). Recent Labs  Lab 02/17/24 1037 02/17/24 1222 02/17/24 1919 02/17/24 2148 02/18/24 0503 02/19/24 0450 02/20/24 0248  PROCALCITON  --  0.68  --   --  0.89  --   --   WBC 6.5  --   --   --  4.4 4.9 4.8  LATICACIDVEN 1.5  1.8  --  2.0* 1.1 1.3  --   --     Liver Function Tests: Recent Labs  Lab 02/17/24 1037  AST 27  ALT 16  ALKPHOS 45  BILITOT 1.1  PROT 8.0  ALBUMIN  3.5   No results for input(s): LIPASE, AMYLASE in the last 168 hours. No results for input(s): AMMONIA in the last 168 hours.  ABG    Component Value Date/Time   HCO3 24.1 02/17/2024 1056   TCO2 28 12/16/2023 0830   ACIDBASEDEF 0.3 02/17/2024 1056   O2SAT 34.3 02/17/2024 1056     Coagulation Profile: Recent Labs  Lab 02/17/24 1037 02/18/24 0503 02/19/24 0854 02/20/24 0248  INR 1.6* 2.0* 2.2* 2.0*    Cardiac Enzymes: No results for input(s): CKTOTAL,  CKMB, CKMBINDEX, TROPONINI in the last 168 hours.  HbA1C: Hemoglobin A1C  Date/Time Value Ref Range Status  12/26/2014 05:20 AM 6.1 (H) % Final    Comment:    4.0-6.0 NOTE: New Reference Range  11/05/14   12/25/2014 03:05 AM 5.9 % Final    Comment:    4.0-6.0 NOTE: New Reference Range  11/05/14    Hgb A1c MFr Bld  Date/Time Value Ref Range Status  12/05/2023 11:46 AM 6.5 4.6 - 6.5 % Final    Comment:    Glycemic Control Guidelines for People with Diabetes:Non Diabetic:  <6%Goal of Therapy: <7%Additional Action Suggested:  >8%   06/14/2023 09:43 AM 6.1 (H) 4.8 - 5.6 % Final    Comment:    (NOTE) Pre diabetes:          5.7%-6.4%  Diabetes:              >6.4%  Glycemic control for   <7.0% adults with diabetes     CBG: Recent Labs  Lab 02/19/24 1626 02/19/24 2035 02/20/24 0817 02/20/24 1336 02/20/24 1612  GLUCAP 121* 127* 119* 119* 100*    Review of Systems:   Review of Systems  Constitutional:  Negative for chills and fever.  Respiratory:  Positive for shortness of breath. Negative for cough.   Cardiovascular:  Negative for chest pain.     Past Medical History:  He,  has a past medical history of (HFpEF) heart failure with preserved ejection fraction (HCC), Arrhythmia, CHF (congestive heart failure) (HCC), Chronic kidney disease (08/2013), Diabetes mellitus type II, non insulin  dependent (HCC), Diabetes mellitus without complication (HCC), DVT (deep venous thrombosis) (HCC), Hepatic steatosis, Hypertension, Morbid obesity with BMI of 50.0-59.9, adult (HCC), Oxygen deficiency, Peripheral vascular disease (HCC), Presence of IVC filter, Pulmonary embolus (HCC) (12/2016), Ventricular trigeminy, and Weakness of both lower limbs.   Surgical History:   Past Surgical History:  Procedure Laterality Date   CARDIAC ELECTROPHYSIOLOGY STUDY AND ABLATION  10/2020   CARDIOVERSION N/A 06/21/2023   Procedure: CARDIOVERSION;  Surgeon: Gardenia Led, DO;  Location:  [facility] ORS;  Service: Cardiovascular;  Laterality: N/A;   IR FLUORO GUIDE CV LINE RIGHT  09/22/2023   IR FLUORO GUIDE CV LINE RIGHT  02/20/2024   PERIPHERAL VASCULAR CATHETERIZATION N/A  01/10/2015   Procedure: Dialysis/Perma Catheter Insertion;  Surgeon: Cordella KANDICE Shawl, MD;  Location: [facility] INVASIVE CV LAB;  Service: Cardiovascular;  Laterality: N/A;   PERIPHERAL VASCULAR CATHETERIZATION N/A 02/24/2015   Procedure: Dialysis/Perma Catheter Removal;  Surgeon: Selinda GORMAN Gu, MD;  Location: [facility] INVASIVE CV LAB;  Service: Cardiovascular;  Laterality: N/A;   PULMONARY ANGIOGRAPHY  12/16/2023   Procedure: PULMONARY ANGIOGRAPHY;  Surgeon: Zenaida Morene PARAS, MD;  Location: [facility] INVASIVE CV LAB;  Service: Cardiovascular;;   PULMONARY THROMBECTOMY N/A 12/13/2022   Procedure: PULMONARY THROMBECTOMY;  Surgeon: Gu Selinda GORMAN, MD;  Location: [facility] INVASIVE CV LAB;  Service: Cardiovascular;  Laterality: N/A;   PULMONARY THROMBECTOMY N/A 05/17/2023   Procedure: PULMONARY THROMBECTOMY;  Surgeon: Shawl Cordella KANDICE, MD;  Location: [facility] INVASIVE CV LAB;  Service: Cardiovascular;  Laterality: N/A;   RIGHT HEART CATH Right 09/20/2023   Procedure: RIGHT HEART CATH;  Surgeon: Zenaida Morene PARAS, MD;  Location: [facility] INVASIVE CV LAB;  Service: Cardiovascular;  Laterality: Right;   RIGHT HEART CATH N/A 12/16/2023   Procedure: RIGHT HEART CATH;  Surgeon: Zenaida Morene PARAS, MD;  Location: [facility] INVASIVE CV LAB;  Service: Cardiovascular;  Laterality: N/A;   TEE WITHOUT CARDIOVERSION N/A 06/21/2023   Procedure: TRANSESOPHAGEAL ECHOCARDIOGRAM (TEE);  Surgeon: Gardenia Led, DO;  Location: [facility] ORS;  Service: Cardiovascular;  Laterality: N/A;     Social History:   reports that he has never smoked. He has never used smokeless tobacco. He reports that he does not drink alcohol and does not use drugs.   Family History:  His family history includes Breast cancer in his mother; Cancer in his mother; Deep vein thrombosis in his cousin;  Diabetes in his father; Heart disease in his maternal grandmother; Hypertension in his father and mother; Prostate cancer in his father; Pulmonary embolism in his cousin, paternal aunt, paternal grandfather, and paternal uncle.   Allergies Allergies  Allergen Reactions   Jardiance  [Empagliflozin ]     Caused infection    Metrizamide Other (See Comments)    Kidney failure requiring dialysis    Clindamycin/Lincomycin Rash   Lincomycin Rash   Sulfa Antibiotics Rash     Home Medications  Prior to Admission medications   Medication Sig Start Date End Date Taking? Authorizing Provider  albuterol  (VENTOLIN  HFA) 108 (90 Base) MCG/ACT inhaler Inhale 2 puffs into the lungs every 6 (six) hours as needed for wheezing or shortness of breath. 02/23/22  Yes Marsa Edelman, DO  milrinone  (PRIMACOR ) 20 MG/100 ML SOLN infusion Inject 0.0413 mg/min into the vein continuous. 09/30/23  Yes Darci Pore, MD  Multiple Vitamin (MULTIVITAMIN WITH MINERALS) TABS tablet Take 1 tablet by mouth daily. 06/24/23  Yes Dorinda Drue DASEN, MD  pantoprazole  (PROTONIX ) 40 MG tablet Take 1 tablet (40 mg total) by mouth daily. On an empty stomach 30 minutes prior to eating 10/14/23  Yes Marylynn Verneita CROME, MD  Riociguat  (ADEMPAS ) 2.5 MG TABS Take 2.5 mg by mouth 3 (three) times daily. 11/23/23  Yes Wise, Jaun S, RPH-CPP  torsemide  (DEMADEX ) 20 MG tablet Take 2 tablets (40 mg total) by mouth daily. 12/16/23  Yes Zenaida Morene PARAS, MD  warfarin (COUMADIN ) 5 MG tablet Take 1 tablet (5 mg total) by mouth daily. Patient taking differently: Take 5 mg by mouth See admin instructions. 5 mg Mon, Wed, Fri, Sat and 6 mg Tu Th and Sun 10/14/23  Yes Marylynn Verneita CROME, MD  tirzepatide  (MOUNJARO ) 7.5 MG/0.5ML Pen Inject 7.5 mg into the skin once a week. Patient  not taking: Reported on 02/17/2024 12/05/23   Marylynn Verneita CROME, MD  warfarin (COUMADIN ) 1 MG tablet Take with 5 mg on Thursdays and Sundays Patient taking differently: Take 1 mg by mouth  See admin instructions. 5 mg take with 1 mg = 6 mg Tu Th and Sun 10/21/23   Marylynn Verneita CROME, MD     Critical care time: 50 minutes

## 2024-02-20 NOTE — Procedures (Signed)
 Interventional Radiology Procedure Note  Procedure: CVC exchange  Complications: None  Estimated Blood Loss: < 10 mL  Findings: 25cm DL tunneled PICC line exchange.  Ryan DELENA Banner, MD

## 2024-02-20 NOTE — Consult Note (Cosign Needed Addendum)
 Consultation Note Date: 02/20/2024 at 1745  Patient Name: Ryan [name]  DOB: 03-25-1971  MRN: [id number]  Age / Sex: 53 y.o., male  PCP: Ryan Verneita CROME, MD Referring Physician: Von Bellis, MD  HPI/Patient Profile: Per previous H&P: 53 y.o. M with CTEPH and pHTN on milrinone , dCHF, chronic respiratory failure on 2L, HTN, DM, CKD IIIa baseline 1.4, hx IVC filter, on warfarin due to Eliquis /Xarelto  failures, and MO.    Patient presented to ED 02/17/2024 from home via EMS c/o SOB, BLE edema and general malaise.  He reports going to aunt's home prior to arrival to meet his home health RN for milrinone  infusion care.  Once he arrived at his aunts home he passed out in a chair and family called EMS.  EMS reports on scene patient had fever and required 10 L O2 to keep sats greater than 90%.  ED workup showed Na+ 132, potassium 4, BUN 16, creatinine 2.00, calcium  8.5, GFR 39, BNP 424.2, troponin 123 => 142, lactic 2.0, WBC 4.4, Hgb 11.9, platelets 147 and INR 2.0. CXR showed malpositioned CVC with no infiltrates.  CTA chest showed chronic PE, no new findings, no pulmonary edema. BP 109/70, temp 99.8, HR 109, RR 28, SpO2 90% 10 L high flow nasal cannula. Wife reported at that time patient lives alone, eats only takeout food, is not sure he is taking his medication and has concerns that he may have memory impairment at times.  Patient was admitted to TRH service for management of acute on chronic respiratory failure with hypoxia, acute right heart failure, suspected cor pulmonale, AKI on CKD stage III AA, nausea vomiting diarrhea and metabolic acidosis.  Patient was subsequently transferred to stepdown unit 02/20/2024 due to continuous CVP monitoring.  PMT was consulted to assist with goals of care conversations.  Clinical Assessment and Goals of Care: Extensive chart review completed prior to meeting patient  including labs, vital signs, imaging, progress notes, orders, and available advanced directive documents from current and previous encounters. I then met with patient to discuss diagnosis prognosis, GOC, EOL wishes, disposition and options.  I introduced Palliative Medicine as specialized medical care for people living with serious illness. It focuses on providing relief from the symptoms and stress of a serious illness. The goal is to improve quality of life for both the patient and the family.  Ill-appearing, obese male sleeping in bed.  He awakens to verbal stimuli.  He is alert to self, location, situation and time.  Even, unlabored respirations.  He is in no distress.  Patient reports extreme fatigue and no appetite from C. difficile.  He denies pain but does complain of some abdominal cramping.     Latest Ref Rng & Units 02/20/2024    2:48 AM 02/19/2024    4:50 AM 02/18/2024    1:45 PM  CMP  Glucose 70 - 99 mg/dL 894  891  872   BUN 6 - 20 mg/dL 19  20  21    Creatinine 0.61 - 1.24 mg/dL 8.51  1.71  1.93   Sodium 135 - 145 mmol/L 136  134  133   Potassium 3.5 - 5.1 mmol/L 3.4  3.1  3.3   Chloride 98 - 111 mmol/L 104  102  102   CO2 22 - 32 mmol/L 24  24  19    Calcium  8.9 - 10.3 mg/dL 8.1  8.0  8.0      We discussed a brief life review of the patient.  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  He is married to Ryan [name], but they are currently separated.  They have 1 daughter.  He reports wife lives approximately 5 minutes away.  As far as functional and nutritional status Ryan [name] shares that he independently lives alone.  He is ambulatory without the use of DME.  He stopped driving a few months ago and has family or friends take him to appointments and grocery store.  He does not cook and reportedly orders most meals from [employer]. Patient's home health nurse visits once per week for management of milrinone  pump.  We  discussed patient's current illness and what it means in the larger context of patient's on-going co-morbidities.  Natural disease trajectory and expectations at EOL were discussed.  Patient understands that he is critically ill with multiple comorbidities requiring 3 hospital admissions within the past 6 months.  I attempted to elicit values and goals of care important to the patient.  Ryan [name] reports at this time his goal is to regain his strength and try to get back to normal and eventually return home.  The difference between aggressive medical intervention and comfort care was considered in light of the patient's goals of care.   Advance directives, concepts specific to code status, artificial feeding and hydration, and rehospitalization were considered and discussed.  Patient shares he wishes to remain full code with all medical interventions.  Education offered regarding concept specific to human mortality and the limitations of medical interventions to prolong life when the body begins to fail to thrive.  Family is facing treatment option decisions, advanced directive, and anticipatory care needs.  Ryan [name] appoints his wife, Ryan [name], as his surrogate decision maker in the event he is unable to make medical decisions for himself.  Patient declines living will/H POA completion services stating he is not ready for that yet.   Discussed with patient/family the importance of continued conversation with family and the medical providers regarding overall plan of care and treatment options, ensuring decisions are within the context of the patient's values and GOCs.    Questions and concerns were addressed. The family was encouraged to call with questions or concerns.   Primary Decision Maker PATIENT  Physical Exam Vitals reviewed.  Constitutional:      General: He is not in acute distress.    Appearance: He is obese. He is ill-appearing.  HENT:     Head: Normocephalic and atraumatic.   Pulmonary:     Effort: Pulmonary effort is normal. No respiratory distress.     Comments: O2 via [place]  Musculoskeletal:     Right lower leg: Edema present.     Left lower leg: Edema present.     Comments: Bilateral extremity chronic venous stasis pigmentation   Skin:    General: Skin is warm and dry.   Neurological:     Mental Status: He is alert and oriented to person, place, and time.     Motor: Weakness present.  Psychiatric:        Mood and Affect: Mood normal.        Behavior: Behavior normal.        Thought Content: Thought content normal.        Judgment: Judgment normal.    Recommendations/Plan: FULL CODE status as previously documented    Continue current supportive interventions PMT will continue to follow peripherally for needs Disposition TBD  Palliative Assessment/Data: 40 to 50%     Thank you for this consult. Palliative medicine will continue to follow and assist holistically.   Time Total: 75 minutes  Time spent includes: Detailed review of medical records (labs, imaging, vital signs), medically appropriate exam (mental status, respiratory, cardiac, skin), discussed with treatment team, counseling and educating patient, family and staff, documenting clinical information, medication management and coordination of care.     Devere Sacks, ELNITA- [facility] Palliative Medicine Team  02/20/2024 5:42 PM  Office [phone]  Pager [phone]     Please contact Palliative Medicine Team providers via AMION for questions and concerns.

## 2024-02-20 NOTE — Consult Note (Signed)
 Infectious Disease     Reason for Consult: Bacteremia   Referring Physician: Dr Von Date of Admission:  02/17/2024   Principal Problem:   Cor pulmonale (HCC) Active Problems:   Essential hypertension   Type 2 diabetes mellitus with diabetic chronic kidney disease (HCC)   CKD (chronic kidney disease), stage IIIa   Morbid obesity with BMI of 50.0-59.9, adult (HCC)   Hyperlipidemia associated with type 2 diabetes mellitus (HCC)   Chronic diastolic (congestive) heart failure (HCC)   Pulmonary hypertension, unspecified (HCC)   Paroxysmal atrial fibrillation (HCC)   Central sleep apnea   S/P insertion of IVC (inferior vena caval) filter   Right ventricular failure (HCC)   CTEPH (chronic thromboembolic pulmonary hypertension) (HCC)   Chronic hypoxic respiratory failure, on home oxygen therapy (HCC)   AKI (acute kidney injury) (HCC)   Hyponatremia   Malpositioned central line   HPI: Ryan [name] is a 53 y.o. male with multiple medical problems including hypertension pulmonary hypertension CHF chronic respiratory failure hypertension diabetes chronic kidney disease history of IVC filter anticoagulated presenting with cough and worsening shortness of breath. On admission chest x-ray showed malpositioned CVC but no infiltrates.  CT of the chest showed chronic PE but no new findings.  He was found on admission to have a fever 100.7 white count was 6.5.  Blood culture from June 20 grew Staph epidermidis in 1 set with [facility] a gene detected by [employer]. He is also found to have C. Difficile and EPEC on stool pcr.  Repeat blood cultures were done June 21 and are no growth to date. He has been on vancomycin  IV from June 20 to 21.  Received  cefepime  during 20th-21st.  Was on azithromycin  x 1 June 20.  Has been on oral vancomycin  since June 21.  Tunneled cath changed today    Past Medical History:  Diagnosis Date   (HFpEF) heart failure with preserved ejection fraction (HCC)    Arrhythmia     atrial fibrillation   CHF (congestive heart failure) (HCC)    Chronic kidney disease 08/2013   Diabetes mellitus type II, non insulin  dependent (HCC)    Diabetes mellitus without complication (HCC)    per pt-pre diabetic-Dr Jadali stated said pt   DVT (deep venous thrombosis) (HCC)    Over 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  Regular the workup has not been forthcoming) previously followed by Dr. Darcy at [facility]   Hepatic steatosis    Hypertension    Morbid obesity with BMI of 50.0-59.9, adult (HCC)    Oxygen deficiency    Peripheral vascular disease (HCC)    Presence of IVC filter    Has 2 IVC filters with significant thrombus burden superior to the filter.   Pulmonary embolus (HCC) 12/2016   Initially just treated with anticoagulation, but in February 2023 in April 2024 treated with EKOS thrombectomy for submassive PE.   Ventricular trigeminy    Weakness of both lower limbs    Past Surgical History:  Procedure Laterality Date   CARDIAC ELECTROPHYSIOLOGY STUDY AND ABLATION  10/2020   CARDIOVERSION N/A 06/21/2023   Procedure: CARDIOVERSION;  Surgeon: Gardenia Led, DO;  Location: [facility] ORS;  Service: Cardiovascular;  Laterality: N/A;   IR FLUORO GUIDE CV LINE RIGHT  09/22/2023   IR FLUORO GUIDE CV LINE RIGHT  02/20/2024   PERIPHERAL VASCULAR CATHETERIZATION N/A 01/10/2015   Procedure: Dialysis/Perma Catheter Insertion;  Surgeon: Cordella KANDICE Shawl, MD;  Location: [facility] INVASIVE CV LAB;  Service: Cardiovascular;  Laterality: N/A;  PERIPHERAL VASCULAR CATHETERIZATION N/A 02/24/2015   Procedure: Dialysis/Perma Catheter Removal;  Surgeon: Selinda GORMAN Gu, MD;  Location: [facility] INVASIVE CV LAB;  Service: Cardiovascular;  Laterality: N/A;   PULMONARY ANGIOGRAPHY  12/16/2023   Procedure: PULMONARY ANGIOGRAPHY;  Surgeon: Zenaida Morene PARAS, MD;  Location: [facility] INVASIVE CV LAB;  Service: Cardiovascular;;   PULMONARY THROMBECTOMY N/A 12/13/2022   Procedure: PULMONARY THROMBECTOMY;  Surgeon: Gu Selinda GORMAN, MD;  Location: [facility]  INVASIVE CV LAB;  Service: Cardiovascular;  Laterality: N/A;   PULMONARY THROMBECTOMY N/A 05/17/2023   Procedure: PULMONARY THROMBECTOMY;  Surgeon: Jama Cordella MATSU, MD;  Location: [facility] INVASIVE CV LAB;  Service: Cardiovascular;  Laterality: N/A;   RIGHT HEART CATH Right 09/20/2023   Procedure: RIGHT HEART CATH;  Surgeon: Zenaida Morene PARAS, MD;  Location: [facility] INVASIVE CV LAB;  Service: Cardiovascular;  Laterality: Right;   RIGHT HEART CATH N/A 12/16/2023   Procedure: RIGHT HEART CATH;  Surgeon: Zenaida Morene PARAS, MD;  Location: [facility] INVASIVE CV LAB;  Service: Cardiovascular;  Laterality: N/A;   TEE WITHOUT CARDIOVERSION N/A 06/21/2023   Procedure: TRANSESOPHAGEAL ECHOCARDIOGRAM (TEE);  Surgeon: Gardenia Led, DO;  Location: [facility] ORS;  Service: Cardiovascular;  Laterality: N/A;   Social History   Tobacco Use   Smoking status: Never   Smokeless tobacco: Never  Vaping Use   Vaping status: Never Used  Substance Use Topics   Alcohol use: Never   Drug use: Never   Family History  Problem Relation Age of Onset   Hypertension Mother    Cancer Mother    Breast cancer Mother    Prostate cancer Father    Hypertension Father    Diabetes Father    Heart disease Maternal Grandmother    Pulmonary embolism Paternal Grandfather    Pulmonary embolism Paternal Uncle    Pulmonary embolism Cousin    Deep vein thrombosis Cousin    Pulmonary embolism Paternal Aunt     Allergies:  Allergies  Allergen Reactions   Jardiance  [Empagliflozin ]     Caused infection    Metrizamide Other (See Comments)    Kidney failure requiring dialysis    Clindamycin/Lincomycin Rash   Lincomycin Rash   Sulfa Antibiotics Rash    Current antibiotics: Antibiotics Given (last 72 hours)     Date/Time Action Medication Dose Rate   02/17/24 1529 New Bag/Given   ceFEPIme  (MAXIPIME ) 2 g in sodium chloride  0.9 % 100 mL IVPB 2 g 200 mL/hr   02/17/24 1627 New Bag/Given   vancomycin  (VANCOREADY) IVPB 1500 mg/300  mL 1,500 mg 150 mL/hr   02/17/24 2009 New Bag/Given  [prior shift/admission]   vancomycin  (VANCOCIN ) IVPB 1000 mg/200 mL premix 1,000 mg 200 mL/hr   02/17/24 2242 New Bag/Given   ceFEPIme  (MAXIPIME ) 2 g in sodium chloride  0.9 % 100 mL IVPB 2 g 200 mL/hr   02/18/24 0634 New Bag/Given   ceFEPIme  (MAXIPIME ) 2 g in sodium chloride  0.9 % 100 mL IVPB 2 g 200 mL/hr   02/18/24 1330 New Bag/Given   ceFEPIme  (MAXIPIME ) 2 g in sodium chloride  0.9 % 100 mL IVPB 2 g 200 mL/hr   02/18/24 1720 Given   vancomycin  (VANCOCIN ) capsule 125 mg 125 mg    02/18/24 2232 Given   vancomycin  (VANCOCIN ) capsule 125 mg 125 mg    02/18/24 2245 New Bag/Given   vancomycin  (VANCOREADY) IVPB 1750 mg/350 mL 1,750 mg 175 mL/hr   02/19/24 0945 Given   vancomycin  (VANCOCIN ) capsule 125 mg 125 mg    02/19/24 1319 Given  vancomycin  (VANCOCIN ) capsule 125 mg 125 mg    02/19/24 1708 Given   vancomycin  (VANCOCIN ) capsule 125 mg 125 mg    02/19/24 2100 Given   vancomycin  (VANCOCIN ) capsule 125 mg 125 mg    02/20/24 9060 Given   vancomycin  (VANCOCIN ) capsule 125 mg 125 mg    02/20/24 1407 Given   vancomycin  (VANCOCIN ) capsule 125 mg 125 mg        MEDICATIONS:  Chlorhexidine  Gluconate Cloth  6 each Topical Daily   cyanocobalamin   1,000 mcg Intramuscular Daily   Followed by   NOREEN ON 02/25/2024] vitamin B-12  1,000 mcg Oral Daily   enoxaparin  (LOVENOX ) injection  150 mg Subcutaneous Q12H   furosemide   80 mg Intravenous BID   insulin  aspart  0-5 Units Subcutaneous QHS   insulin  aspart  0-9 Units Subcutaneous TID WC   multivitamin with minerals  1 tablet Oral Daily   pantoprazole   40 mg Oral Daily   Riociguat   2.5 mg Oral TID   saccharomyces boulardii  250 mg Oral BID   spironolactone   25 mg Oral Daily   vancomycin   125 mg Oral QID   Vitamin D  (Ergocalciferol )  50,000 Units Oral Q7 days   warfarin  8 mg Oral q1600   Warfarin - Pharmacist Dosing Inpatient   Does not apply q1600    Review of Systems - 11 systems  reviewed and negative per HPI   OBJECTIVE: Temp:  [98.3 F (36.8 C)-98.5 F (36.9 C)] 98.3 F (36.8 C) (06/23 1300) Pulse Rate:  [87-96] 87 (06/23 1400) Resp:  [14-22] 22 (06/23 1400) BP: (103-143)/(65-78) 103/65 (06/23 1400) SpO2:  [91 %-93 %] 91 % (06/23 1400) Weight:  [166.9 kg] 166.9 kg (06/23 0355) Physical Exam  Constitutional: He is oriented to person, place, and time.obese HENT:  Mouth/Throat: Oropharynx is clear and moist. No oropharyngeal exudate.  Cardiovascular: Normal rate, regular rhythm and normal heart sounds.  Pulmonary/Chest: Effort normal and breath sounds normal. No respiratory distress. He has no wheezes.  Tunneled cath R chest wall Abdominal: Soft. Bowel sounds are normal. He exhibits no distension. There is no tenderness.  Lymphadenopathy: He has no cervical adenopathy.  Neurological: He is alert and oriented to person, place, and time.  Skin: brawny bil le edema with a few small wounds Psychiatric: He has a normal mood and affect. His behavior is normal.     LABS: Results for orders placed or performed during the hospital encounter of 02/17/24 (from the past 48 hours)  Glucose, capillary     Status: Abnormal   Collection Time: 02/18/24  4:43 PM  Result Value Ref Range   Glucose-Capillary 109 (H) 70 - 99 mg/dL    Comment: Glucose reference range applies only to samples taken after fasting for at least 8 hours.  Vancomycin , random     Status: None   Collection Time: 02/18/24  8:12 PM  Result Value Ref Range   Vancomycin  Rm 9 ug/mL    Comment:        Random Vancomycin  therapeutic range is dependent on dosage and time of specimen collection. A peak range is 20-40 ug/mL A trough range is 5-15 ug/mL        Performed at [facility], [address] [place], [place] [address]   Glucose, capillary     Status: Abnormal   Collection Time: 02/18/24 10:00 PM  Result Value Ref Range   Glucose-Capillary 118 (H) 70 - 99 mg/dL    Comment: Glucose  reference range  applies only to samples taken after fasting for at least 8 hours.  Basic metabolic panel with GFR     Status: Abnormal   Collection Time: 02/19/24  4:50 AM  Result Value Ref Range   Sodium 134 (L) 135 - 145 mmol/L   Potassium 3.1 (L) 3.5 - 5.1 mmol/L   Chloride 102 98 - 111 mmol/L   CO2 24 22 - 32 mmol/L   Glucose, Bld 108 (H) 70 - 99 mg/dL    Comment: Glucose reference range applies only to samples taken after fasting for at least 8 hours.   BUN 20 6 - 20 mg/dL   Creatinine, Ser 8.28 (H) 0.61 - 1.24 mg/dL   Calcium  8.0 (L) 8.9 - 10.3 mg/dL   GFR, Estimated 47 (L) >60 mL/min    Comment: (NOTE) Calculated using the CKD-EPI Creatinine Equation (2021)    Anion gap 8 5 - 15    Comment: Performed at [facility], [address] [place], [place] [address]  CBC     Status: Abnormal   Collection Time: 02/19/24  4:50 AM  Result Value Ref Range   WBC 4.9 4.0 - 10.5 K/uL   RBC 4.48 4.22 - 5.81 MIL/uL   Hemoglobin 12.2 (L) 13.0 - 17.0 g/dL   HCT 61.7 (L) 60.9 - 47.9 %   MCV 85.3 80.0 - 100.0 fL   MCH 27.2 26.0 - 34.0 pg   MCHC 31.9 30.0 - 36.0 g/dL   RDW 83.8 (H) 88.4 - 84.4 %   Platelets 151 150 - 400 K/uL   nRBC 0.0 0.0 - 0.2 %    Comment: Performed at [facility], [address] [place], [place] [address]  Magnesium      Status: None   Collection Time: 02/19/24  4:50 AM  Result Value Ref Range   Magnesium  1.9 1.7 - 2.4 mg/dL    Comment: Performed at [facility], [address] [place], [place] [address]  Phosphorus     Status: None   Collection Time: 02/19/24  4:50 AM  Result Value Ref Range   Phosphorus 3.1 2.5 - 4.6 mg/dL    Comment: Performed at [facility], [address] [place], [place] [address]  Glucose, capillary     Status: Abnormal   Collection Time: 02/19/24  8:17 AM  Result Value Ref Range   Glucose-Capillary 118 (H) 70 - 99 mg/dL    Comment: Glucose reference range applies only to samples taken after  fasting for at least 8 hours.  Protime-INR     Status: Abnormal   Collection Time: 02/19/24  8:54 AM  Result Value Ref Range   Prothrombin Time 24.7 (H) 11.4 - 15.2 seconds   INR 2.2 (H) 0.8 - 1.2    Comment: (NOTE) INR goal varies based on device and disease states. Performed at [facility], [address] [place], [place] [address]   Glucose, capillary     Status: Abnormal   Collection Time: 02/19/24 11:45 AM  Result Value Ref Range   Glucose-Capillary 125 (H) 70 - 99 mg/dL    Comment: Glucose reference range applies only to samples taken after fasting for at least 8 hours.  Glucose, capillary     Status: Abnormal   Collection Time: 02/19/24  4:26 PM  Result Value Ref Range   Glucose-Capillary 121 (H) 70 - 99 mg/dL    Comment: Glucose reference range applies only to samples taken after fasting for at least 8 hours.  Glucose,  capillary     Status: Abnormal   Collection Time: 02/19/24  8:35 PM  Result Value Ref Range   Glucose-Capillary 127 (H) 70 - 99 mg/dL    Comment: Glucose reference range applies only to samples taken after fasting for at least 8 hours.  Urinalysis, Routine w reflex microscopic -Urine, Clean Catch     Status: Abnormal   Collection Time: 02/20/24  1:45 AM  Result Value Ref Range   Color, Urine YELLOW (A) YELLOW   APPearance CLEAR (A) CLEAR   Specific Gravity, Urine 1.012 1.005 - 1.030   pH 5.0 5.0 - 8.0   Glucose, UA NEGATIVE NEGATIVE mg/dL   Hgb urine dipstick MODERATE (A) NEGATIVE   Bilirubin Urine NEGATIVE NEGATIVE   Ketones, ur NEGATIVE NEGATIVE mg/dL   Protein, ur 30 (A) NEGATIVE mg/dL   Nitrite NEGATIVE NEGATIVE   Leukocytes,Ua NEGATIVE NEGATIVE   RBC / HPF 0-5 0 - 5 RBC/hpf   WBC, UA 0-5 0 - 5 WBC/hpf   Bacteria, UA RARE (A) NONE SEEN   Squamous Epithelial / HPF 0-5 0 - 5 /HPF   Mucus PRESENT    Hyaline Casts, UA PRESENT     Comment: Performed at [facility], [address] [place], [place] [address]  Protime-INR      Status: Abnormal   Collection Time: 02/20/24  2:48 AM  Result Value Ref Range   Prothrombin Time 22.7 (H) 11.4 - 15.2 seconds   INR 2.0 (H) 0.8 - 1.2    Comment: (NOTE) INR goal varies based on device and disease states. Performed at [facility], [address] [place], [place] [address]   Basic metabolic panel with GFR     Status: Abnormal   Collection Time: 02/20/24  2:48 AM  Result Value Ref Range   Sodium 136 135 - 145 mmol/L   Potassium 3.4 (L) 3.5 - 5.1 mmol/L   Chloride 104 98 - 111 mmol/L   CO2 24 22 - 32 mmol/L   Glucose, Bld 105 (H) 70 - 99 mg/dL    Comment: Glucose reference range applies only to samples taken after fasting for at least 8 hours.   BUN 19 6 - 20 mg/dL   Creatinine, Ser 8.51 (H) 0.61 - 1.24 mg/dL   Calcium  8.1 (L) 8.9 - 10.3 mg/dL   GFR, Estimated 56 (L) >60 mL/min    Comment: (NOTE) Calculated using the CKD-EPI Creatinine Equation (2021)    Anion gap 8 5 - 15    Comment: Performed at [facility], [address] [place], [place] [address]  CBC     Status: Abnormal   Collection Time: 02/20/24  2:48 AM  Result Value Ref Range   WBC 4.8 4.0 - 10.5 K/uL   RBC 4.62 4.22 - 5.81 MIL/uL   Hemoglobin 12.1 (L) 13.0 - 17.0 g/dL   HCT 60.9 60.9 - 47.9 %   MCV 84.4 80.0 - 100.0 fL   MCH 26.2 26.0 - 34.0 pg   MCHC 31.0 30.0 - 36.0 g/dL   RDW 83.8 (H) 88.4 - 84.4 %   Platelets 188 150 - 400 K/uL   nRBC 0.0 0.0 - 0.2 %    Comment: Performed at [facility], [address] [place], [place] [address]  Magnesium      Status: None   Collection Time: 02/20/24  2:48 AM  Result Value Ref Range   Magnesium  1.9 1.7 - 2.4 mg/dL    Comment: Performed at [facility],  [address], [place], [place] [address]  Phosphorus     Status: None   Collection Time: 02/20/24  2:48 AM  Result Value Ref Range   Phosphorus 3.0 2.5 - 4.6 mg/dL    Comment: Performed at [facility], [address] [place], [place] [address]   Glucose, capillary     Status: Abnormal   Collection Time: 02/20/24  8:17 AM  Result Value Ref Range   Glucose-Capillary 119 (H) 70 - 99 mg/dL    Comment: Glucose reference range applies only to samples taken after fasting for at least 8 hours.  Glucose, capillary     Status: Abnormal   Collection Time: 02/20/24  1:36 PM  Result Value Ref Range   Glucose-Capillary 119 (H) 70 - 99 mg/dL    Comment: Glucose reference range applies only to samples taken after fasting for at least 8 hours.   No components found for: ESR, C REACTIVE PROTEIN MICRO: Recent Results (from the past 720 hours)  Culture, blood (routine x 2)     Status: Abnormal   Collection Time: 02/17/24 10:37 AM   Specimen: BLOOD  Result Value Ref Range Status   Specimen Description   Final    BLOOD RIGHT ANTECUBITAL Performed at [facility], [address] [place], [place] [address]    Special Requests   Final    BOTTLES DRAWN AEROBIC AND ANAEROBIC Blood Culture adequate volume Performed at [facility], [address] [place], [place] [address]    Culture  Setup Time   Final    Organism ID to follow IN BOTH AEROBIC AND ANAEROBIC BOTTLES GRAM POSITIVE COCCI CRITICAL RESULT CALLED TO, READ BACK BY AND VERIFIED WITH: JASON ROBBINS @ 02/18/24 0221 AB Performed at [facility], [address] [place], [place] [address]    Culture (A)  Final    STAPHYLOCOCCUS EPIDERMIDIS THE SIGNIFICANCE OF ISOLATING THIS ORGANISM FROM A SINGLE SET OF BLOOD CULTURES WHEN MULTIPLE SETS ARE DRAWN IS UNCERTAIN. PLEASE NOTIFY THE MICROBIOLOGY DEPARTMENT WITHIN ONE WEEK IF SPECIATION AND SENSITIVITIES ARE REQUIRED. Performed at [facility], [address], [place], [place] [address]    Report Status 02/20/2024 FINAL  Final  Blood Culture ID Panel (Reflexed)     Status: Abnormal   Collection Time: 02/17/24 10:37 AM  Result Value Ref Range Status   Enterococcus faecalis NOT DETECTED NOT DETECTED  Final   Enterococcus Faecium NOT DETECTED NOT DETECTED Final   Listeria monocytogenes NOT DETECTED NOT DETECTED Final   Staphylococcus species DETECTED (A) NOT DETECTED Final    Comment: CRITICAL RESULT CALLED TO, READ BACK BY AND VERIFIED WITH: JASON ROBBINS @ 02/18/24 0221 AB    Staphylococcus aureus (BCID) NOT DETECTED NOT DETECTED Final   Staphylococcus epidermidis DETECTED (A) NOT DETECTED Final    Comment: Methicillin (oxacillin) resistant coagulase negative staphylococcus. Possible blood culture contaminant (unless isolated from more than one blood culture draw or clinical case suggests pathogenicity). No antibiotic treatment is indicated for blood  culture contaminants. CRITICAL RESULT CALLED TO, READ BACK BY AND VERIFIED WITH: JASON ROBBINS @ 02/18/24 0221 AB    Staphylococcus lugdunensis NOT DETECTED NOT DETECTED Final   Streptococcus species NOT DETECTED NOT DETECTED Final   Streptococcus agalactiae NOT DETECTED NOT DETECTED Final   Streptococcus pneumoniae NOT DETECTED NOT DETECTED Final   Streptococcus pyogenes NOT DETECTED NOT DETECTED Final   A.calcoaceticus-baumannii NOT DETECTED NOT DETECTED Final   Bacteroides fragilis NOT DETECTED NOT DETECTED Final  Enterobacterales NOT DETECTED NOT DETECTED Final   Enterobacter cloacae complex NOT DETECTED NOT DETECTED Final   Escherichia coli NOT DETECTED NOT DETECTED Final   Klebsiella aerogenes NOT DETECTED NOT DETECTED Final   Klebsiella oxytoca NOT DETECTED NOT DETECTED Final   Klebsiella pneumoniae NOT DETECTED NOT DETECTED Final   Proteus species NOT DETECTED NOT DETECTED Final   Salmonella species NOT DETECTED NOT DETECTED Final   Serratia marcescens NOT DETECTED NOT DETECTED Final   Haemophilus influenzae NOT DETECTED NOT DETECTED Final   Neisseria meningitidis NOT DETECTED NOT DETECTED Final   Pseudomonas aeruginosa NOT DETECTED NOT DETECTED Final   Stenotrophomonas maltophilia NOT DETECTED NOT DETECTED Final    Candida albicans NOT DETECTED NOT DETECTED Final   Candida auris NOT DETECTED NOT DETECTED Final   Candida glabrata NOT DETECTED NOT DETECTED Final   Candida krusei NOT DETECTED NOT DETECTED Final   Candida parapsilosis NOT DETECTED NOT DETECTED Final   Candida tropicalis NOT DETECTED NOT DETECTED Final   Cryptococcus neoformans/gattii NOT DETECTED NOT DETECTED Final   Methicillin resistance mecA/C DETECTED (A) NOT DETECTED Final    Comment: CRITICAL RESULT CALLED TO, READ BACK BY AND VERIFIED WITH: JASON ROBBINS @ 02/18/24 0221 AB Performed at [facility], [address] [place], [place] [address]   Resp panel by RT-PCR (RSV, Flu A&B, Covid) Anterior Nasal Swab     Status: None   Collection Time: 02/17/24 12:22 PM   Specimen: Anterior Nasal Swab  Result Value Ref Range Status   SARS Coronavirus 2 by RT PCR NEGATIVE NEGATIVE Final    Comment: (NOTE) SARS-CoV-2 target nucleic acids are NOT DETECTED.  The SARS-CoV-2 RNA is generally detectable in upper respiratory specimens during the acute phase of infection. The lowest concentration of SARS-CoV-2 viral copies this assay can detect is 138 copies/mL. A negative result does not preclude SARS-Cov-2 infection and should not be used as the sole basis for treatment or other patient management decisions. A negative result may occur with  improper specimen collection/handling, submission of specimen other than nasopharyngeal swab, presence of viral mutation(s) within the areas targeted by this assay, and inadequate number of viral copies(<138 copies/mL). A negative result must be combined with clinical observations, patient history, and epidemiological information. The expected result is Negative.  Fact Sheet for Patients:  BloggerCourse.com  Fact Sheet for Healthcare Providers:  SeriousBroker.it  This test is no t yet approved or cleared by the [place]  FDA and  has  been authorized for detection and/or diagnosis of SARS-CoV-2 by FDA under an Emergency Use Authorization (EUA). This EUA will remain  in effect (meaning this test can be used) for the duration of the COVID-19 declaration under Section 564(b)(1) of the Act, 21 U.S.C.section 360bbb-3(b)(1), unless the authorization is terminated  or revoked sooner.       Influenza A by PCR NEGATIVE NEGATIVE Final   Influenza B by PCR NEGATIVE NEGATIVE Final    Comment: (NOTE) The Xpert Xpress SARS-CoV-2/FLU/RSV plus assay is intended as an aid in the diagnosis of influenza from Nasopharyngeal swab specimens and should not be used as a sole basis for treatment. Nasal washings and aspirates are unacceptable for Xpert Xpress SARS-CoV-2/FLU/RSV testing.  Fact Sheet for Patients: BloggerCourse.com  Fact Sheet for Healthcare Providers: SeriousBroker.it  This test is not yet approved or cleared by the [place]  FDA and has been authorized for detection and/or diagnosis of SARS-CoV-2 by FDA under an Emergency Use Authorization (EUA). This EUA will remain in effect (meaning  this test can be used) for the duration of the COVID-19 declaration under Section 564(b)(1) of the Act, 21 U.S.C. section 360bbb-3(b)(1), unless the authorization is terminated or revoked.     Resp Syncytial Virus by PCR NEGATIVE NEGATIVE Final    Comment: (NOTE) Fact Sheet for Patients: BloggerCourse.com  Fact Sheet for Healthcare Providers: SeriousBroker.it  This test is not yet approved or cleared by the [place]  FDA and has been authorized for detection and/or diagnosis of SARS-CoV-2 by FDA under an Emergency Use Authorization (EUA). This EUA will remain in effect (meaning this test can be used) for the duration of the COVID-19 declaration under Section 564(b)(1) of the Act, 21 U.S.C. section 360bbb-3(b)(1), unless the  authorization is terminated or revoked.  Performed at [facility], [address] [place], [place] [address]   Culture, blood (routine x 2)     Status: None (Preliminary result)   Collection Time: 02/17/24  7:19 PM   Specimen: BLOOD  Result Value Ref Range Status   Specimen Description BLOOD BLOOD RIGHT ARM  Final   Special Requests   Final    BOTTLES DRAWN AEROBIC AND ANAEROBIC Blood Culture results may not be optimal due to an inadequate volume of blood received in culture bottles   Culture   Final    NO GROWTH 3 DAYS Performed at [facility], [address] [place], [place] [address]    Report Status PENDING  Incomplete  Culture, blood (Routine X 2) w Reflex to ID Panel     Status: None (Preliminary result)   Collection Time: 02/18/24  9:11 AM   Specimen: BLOOD  Result Value Ref Range Status   Specimen Description BLOOD BLOOD RIGHT ARM  Final   Special Requests   Final    BOTTLES DRAWN AEROBIC AND ANAEROBIC Blood Culture adequate volume   Culture   Final    NO GROWTH 2 DAYS Performed at [facility], [address], [place], [place] [address]    Report Status PENDING  Incomplete  Culture, blood (Routine X 2) w Reflex to ID Panel     Status: None (Preliminary result)   Collection Time: 02/18/24  9:18 AM   Specimen: BLOOD  Result Value Ref Range Status   Specimen Description BLOOD BLOOD RIGHT FOREARM  Final   Special Requests   Final    BOTTLES DRAWN AEROBIC AND ANAEROBIC Blood Culture adequate volume   Culture   Final    NO GROWTH 2 DAYS Performed at [facility], [address] [place], [place] [address]    Report Status PENDING  Incomplete  MRSA Next Gen by PCR, Nasal     Status: None   Collection Time: 02/18/24  9:41 AM   Specimen: Nasal Mucosa; Nasal Swab  Result Value Ref Range Status   MRSA by PCR Next Gen NOT DETECTED NOT DETECTED Final    Comment: (NOTE) The GeneXpert MRSA Assay (FDA approved for NASAL specimens  only), is one component of a comprehensive MRSA colonization surveillance program. It is not intended to diagnose MRSA infection nor to guide or monitor treatment for MRSA infections. Test performance is not FDA approved in patients less than 43 years old. Performed at [facility], [address] [place], [place] [address]   C Difficile Quick Screen w PCR reflex     Status: Abnormal   Collection Time: 02/18/24  1:40 PM   Specimen: STOOL  Result Value Ref Range Status   C Diff antigen POSITIVE (  A) NEGATIVE Final   C Diff toxin NEGATIVE NEGATIVE Final   C Diff interpretation Results are indeterminate. See PCR results.  Final    Comment: Performed at [facility], [address] [place], [place] [address]  Gastrointestinal Panel by PCR , Stool     Status: Abnormal   Collection Time: 02/18/24  1:40 PM   Specimen: Stool  Result Value Ref Range Status   Campylobacter species NOT DETECTED NOT DETECTED Final   Plesimonas shigelloides NOT DETECTED NOT DETECTED Final   Salmonella species NOT DETECTED NOT DETECTED Final   Yersinia enterocolitica NOT DETECTED NOT DETECTED Final   Vibrio species NOT DETECTED NOT DETECTED Final   Vibrio cholerae NOT DETECTED NOT DETECTED Final   Enteroaggregative E coli (EAEC) NOT DETECTED NOT DETECTED Final   Enteropathogenic E coli (EPEC) DETECTED (A) NOT DETECTED Final    Comment: RESULT CALLED TO, READ BACK BY AND VERIFIED WITH: LAURINE PENG AT 1608 02/18/24.PMF    Enterotoxigenic E coli (ETEC) NOT DETECTED NOT DETECTED Final   Shiga like toxin producing E coli (STEC) NOT DETECTED NOT DETECTED Final   Shigella/Enteroinvasive E coli (EIEC) NOT DETECTED NOT DETECTED Final   Cryptosporidium NOT DETECTED NOT DETECTED Final   Cyclospora cayetanensis NOT DETECTED NOT DETECTED Final   Entamoeba histolytica NOT DETECTED NOT DETECTED Final   Giardia lamblia NOT DETECTED NOT DETECTED Final   Adenovirus F40/41 NOT DETECTED NOT DETECTED  Final   Astrovirus NOT DETECTED NOT DETECTED Final   Norovirus GI/GII NOT DETECTED NOT DETECTED Final   Rotavirus A NOT DETECTED NOT DETECTED Final   Sapovirus (I, II, IV, and V) NOT DETECTED NOT DETECTED Final    Comment: Performed at [facility], [address] [place], [place] [address]  C. Diff by PCR, Reflexed     Status: Abnormal   Collection Time: 02/18/24  1:40 PM  Result Value Ref Range Status   Toxigenic C. Difficile by PCR POSITIVE (A) NEGATIVE Final    Comment: Positive for toxigenic C. difficile with little to no toxin production. Only treat if clinical presentation suggests symptomatic illness. Performed at [facility], [address] [place], [place] [address]     IMAGING: IR Fluoro Guide CV Line Right Result Date: 02/20/2024 INDICATION: Partially retracted tunneled dual lumen PICC line in the right IJ. Cough is been exposed. Planned exchange for a new working tunneled PICC line. EXAM: Venous access exchange MEDICATIONS: Ancef  3 gm IV; The antibiotic was administered within an appropriate time interval prior to skin puncture. ANESTHESIA/SEDATION: Moderate (conscious) sedation was employed during this procedure. A total of Versed  0 mg and Fentanyl  0 mcg was administered intravenously by the radiology nurse. Total intra-service moderate Sedation Time: 0 minutes. The patient's level of consciousness and vital signs were monitored continuously by radiology nursing throughout the procedure under my direct supervision. FLUOROSCOPY: Radiation Exposure Index (as provided by the fluoroscopic device): 0.1 minute 1 mGy Kerma COMPLICATIONS: None immediate. PROCEDURE: Informed written consent was obtained from the patient after a thorough discussion of the procedural risks, benefits and alternatives. All questions were addressed. Maximal Sterile Barrier Technique was utilized including caps, mask, sterile gowns, sterile gloves, sterile drape, hand hygiene and skin  antiseptic. A timeout was performed prior to the initiation of the procedure. Initial fluoroscopic images of the patient in a supine position demonstrated the catheter to be partially retracted within the venous system. One of the 2 lumens in this dual lumen PICC line would not aspirate. The retention  suture and catheter were cut. An 018 guidewire then advanced through the remaining PICC line and the PICC line was removed from the patient. The PICC line was examined and had a length of 25 cm. The new dual lumen tunneled PICC line was cut to 25 cm and advanced over the guidewire under fluoroscopic guidance until the catheter tip was at the cavoatrial junction. The guidewire was removed. Retention suture applied. The catheter was tested for function and found to be functioning well. Both lumens were then capped. IMPRESSION: Successful fluoroscopic guided exchange of a tunneled dual lumen PICC line the right IJ as described above. Electronically Signed   By: Cordella Banner   On: 02/20/2024 13:12   US  RENAL Result Date: 02/18/2024 CLINICAL DATA:  AKI. EXAM: RENAL / URINARY TRACT ULTRASOUND COMPLETE COMPARISON:  04/06/2022. FINDINGS: Right Kidney: Renal measurements: 11.2 x 5.2 x 5.5 cm = volume: 166.6 mL. Increased. No mass or hydronephrosis visualized. Left Kidney: Renal measurements: 11.3 x 6.0 x 7.9 cm = volume: 173.2 mL. Echogenicity within normal limits. No mass or hydronephrosis visualized. Bladder: Appears normal for degree of bladder distention. Other: Images are suboptimal due to bowel gas and patient's body habitus. IMPRESSION: 1. Increased renal echogenicity on the right, may be associated with underlying medical renal disease. 2. No hydronephrosis bilaterally. 3. Normal bladder. Electronically Signed   By: Leita Birmingham M.D.   On: 02/18/2024 15:48   DG Chest Port 1 View Result Date: 02/18/2024 CLINICAL DATA:  Vascular catheter EXAM: PORTABLE CHEST 1 VIEW COMPARISON:  Chest radiograph dated 02/17/2024  FINDINGS: Lines/tubes: Right internal jugular venous catheter has been repositioned and tip projects over the SVC. Lungs: Well inflated lungs. Chronic-appearing bilateral perihilar vascular congestion. Pleura: No pneumothorax or pleural effusion. Heart/mediastinum: Similar mildly enlarged cardiomediastinal silhouette. Bones: No acute osseous abnormality. IMPRESSION: 1. Right internal jugular venous catheter has been repositioned and tip projects over the SVC. No pneumothorax. 2. Chronic-appearing bilateral perihilar vascular congestion. Electronically Signed   By: Limin  Xu M.D.   On: 02/18/2024 12:09   US  EKG SITE RITE Result Date: 02/17/2024 If Site Rite image not attached, placement could not be confirmed due to current cardiac rhythm.  DG Chest Portable 1 View Result Date: 02/17/2024 CLINICAL DATA:  Dyspnea and cough EXAM: PORTABLE CHEST 1 VIEW COMPARISON:  09/26/2023 chest radiograph. FINDINGS: Right internal jugular central venous catheter loops within and terminates within the right brachiocephalic vein, with the tip oriented superiorly, new from prior radiograph. Stable cardiomediastinal silhouette with top normal heart size and with prominent main pulmonary artery contour. No pneumothorax. No pleural effusion. No overt pulmonary edema. No acute consolidative airspace disease. IMPRESSION: 1. Right internal jugular central venous catheter loops within and terminates within the right brachiocephalic vein, with the tip oriented superiorly, new from prior radiograph. 2. No acute cardiopulmonary disease. 3. Chronic prominent main pulmonary artery contour suggesting dilated pulmonary artery and pulmonary hypertension. Electronically Signed   By: Selinda DELENA Blue M.D.   On: 02/17/2024 12:38   CT Angio Chest PE W/Cm &/Or Wo Cm Result Date: 02/17/2024 CLINICAL DATA:  Pulmonary embolism (PE) suspected, high prob, dyspnea, history of chronic pulmonary embolism EXAM: CT ANGIOGRAPHY CHEST WITH CONTRAST TECHNIQUE:  Multidetector CT imaging of the chest was performed using the standard protocol during bolus administration of intravenous contrast. Multiplanar CT image reconstructions and MIPs were obtained to evaluate the vascular anatomy. RADIATION DOSE REDUCTION: This exam was performed according to the departmental dose-optimization program which includes automated exposure control, adjustment of the  mA and/or kV according to patient size and/or use of iterative reconstruction technique. CONTRAST:  60mL OMNIPAQUE  IOHEXOL  350 MG/ML SOLN COMPARISON:  10/16/2023 chest CT angiogram and Chest radiograph from earlier today. FINDINGS: Cardiovascular: The study is moderate quality for the evaluation of pulmonary embolism, limited by some motion degradation. Chronic eccentric nonobstructive thrombus in the anterior right pulmonary artery extending into the lobar branches, not appreciably changed. Chronic web-like filling defects in the bilateral lower lobe pulmonary artery branches, not appreciably changed. No acute pulmonary artery filling defects. Atherosclerotic nonaneurysmal thoracic aorta. Dilated main pulmonary artery (3.9 cm diameter). Borderline mild cardiomegaly. No significant pericardial fluid/thickening. Right internal jugular central venous catheter loops in and terminates within the right brachiocephalic vein. Mediastinum/Nodes: No significant thyroid  nodules. Unremarkable esophagus. No pathologically enlarged axillary, mediastinal or hilar lymph nodes. Lungs/Pleura: No pneumothorax. No pleural effusion. No acute consolidative airspace disease, lung masses or significant pulmonary nodules. Upper abdomen: Small hiatal hernia. IVC filter partially visualized in the suprarenal IVC, unchanged. Musculoskeletal: No aggressive appearing focal osseous lesions. Extensive subcutaneous varices noted throughout the bilateral ventral chest wall, unchanged. Moderate thoracic spondylosis. Review of the MIP images confirms the above  findings. IMPRESSION: 1. No acute pulmonary embolism. 2. 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 3. Borderline mild cardiomegaly. 4. Small hiatal hernia. 5. Right internal jugular central venous catheter loops in and terminates within the right brachiocephalic vein. 6. 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 7.  Aortic Atherosclerosis (ICD10-I70.0). Electronically Signed   By: Selinda DELENA Blue M.D.   On: 02/17/2024 12:37   CT Head Wo Contrast Result Date: 02/17/2024 EXAM: CT HEAD WITHOUT CONTRAST 02/17/2024 12:17:03 PM TECHNIQUE: CT of the head was performed without the administration of intravenous contrast. Automated exposure control, iterative reconstruction, and/or weight based adjustment of the mA/kV was utilized to reduce the radiation dose to as low as reasonably achievable. COMPARISON: 07/08/2004. CLINICAL HISTORY: Mental status change, unknown cause. Best possible. Unable to hold still due to difficulty breathing. Repeat head one to try for less motion. FINDINGS: BRAIN AND VENTRICLES: No acute hemorrhage. Gray-white differentiation is preserved. No hydrocephalus. No extra-axial collection. No mass effect or midline shift. ORBITS: No acute abnormality. SINUSES: No acute abnormality. SOFT TISSUES AND SKULL: No acute soft tissue abnormality. No skull fracture. IMPRESSION: 1. No acute intracranial abnormality. Electronically signed by: Ryan Chess MD 02/17/2024 12:25 PM EDT RP Workstation: [id number]    Assessment:   Ryan [name] is a 53 y.o. male with a complicated medical history who has pulmonary hypertension CHF on home milrinone  who was admitted with worsening shortness of breath diarrhea.  He was found to have C. difficile as well as E. coli as stool PCR.  Blood cultures 1 of 2 sets grew Staph epidermidis but it appears second set was not done 2 hours later after he had received a  dose of vancomycin .  He received a few days of vancomycin  but is now been off for 2 days.  No fevers and white count is normal.  He had his catheter changed 6/23  over guidewire. Currently no evidence of active infection.  Recommendations Bacteremia - I suspect this is contaminant but cannot ro infection. FU cx neg but he received only a few days of vanco. Cath has been exchanged 6/23/ Rec monitor off abx. Repeat bcx tomorrow x 2.  If remains negative no further action needed. If turns + will need treatment  C diff- cont oral vanco x  10 days  Thank you very much for allowing me to participate in the care of this patient. Please call with questions.   Alm SQUIBB. Epifanio, MD

## 2024-02-20 NOTE — Telephone Encounter (Signed)
 Patient Product/process development scientist completed.    The patient is insured through [facility]. Patient has [employer], may use a copay card, and/or apply for patient assistance if available.    Ran test claim for vancomycin  125 mg and the current 10 day co-pay is $0.00.   This test claim was processed through [facility]- copay amounts may vary at other pharmacies due to pharmacy/plan contracts, or as the patient moves through the different stages of their insurance plan.     Reyes Sharps, CPHT Pharmacy Technician III Certified Patient Advocate [facility] Patient Advocate Team Direct Number: [phone]  Fax: [phone]

## 2024-02-20 NOTE — Plan of Care (Signed)
  Problem: Education: Goal: Knowledge of General Education information will improve Description: Including pain rating scale, medication(s)/side effects and non-pharmacologic comfort measures Outcome: Progressing   Problem: Nutrition: Goal: Adequate nutrition will be maintained Outcome: Progressing   Problem: Coping: Goal: Level of anxiety will decrease Outcome: Progressing   Problem: Elimination: Goal: Will not experience complications related to urinary retention Outcome: Progressing   Problem: Pain Managment: Goal: General experience of comfort will improve and/or be controlled Outcome: Progressing   Problem: Safety: Goal: Ability to remain free from injury will improve Outcome: Progressing   Problem: Skin Integrity: Goal: Risk for impaired skin integrity will decrease Outcome: Progressing   Problem: Health Behavior/Discharge Planning: Goal: Ability to identify and utilize available resources and services will improve Outcome: Progressing   Problem: Metabolic: Goal: Ability to maintain appropriate glucose levels will improve Outcome: Progressing

## 2024-02-21 ENCOUNTER — Inpatient Hospital Stay (HOSPITAL_COMMUNITY)
Admit: 2024-02-21 | Discharge: 2024-02-21 | Disposition: A | Discharge status: Home / Self Care | Attending: Student in an Organized Health Care Education/Training Program | Admitting: Student in an Organized Health Care Education/Training Program

## 2024-02-21 DIAGNOSIS — I50810 Right heart failure, unspecified: Secondary | ICD-10-CM | POA: Diagnosis not present

## 2024-02-21 DIAGNOSIS — I272 Pulmonary hypertension, unspecified: Secondary | ICD-10-CM | POA: Diagnosis not present

## 2024-02-21 DIAGNOSIS — I2781 Cor pulmonale (chronic): Secondary | ICD-10-CM | POA: Diagnosis not present

## 2024-02-21 LAB — COOXEMETRY PANEL
Carboxyhemoglobin: 0.7 % (ref 0.5–1.5)
Carboxyhemoglobin: 1.1 % (ref 0.5–1.5)
Methemoglobin: 0.7 % (ref 0.0–1.5)
Methemoglobin: 1.1 % (ref 0.0–1.5)
O2 Saturation: 69.1 %
O2 Saturation: 64.2 %
Total hemoglobin: 14.7 g/dL (ref 12.0–16.0)
Total hemoglobin: 13.9 g/dL (ref 12.0–16.0)
Total oxygen content: 68.1 %
Total oxygen content: 62.8 %

## 2024-02-21 LAB — BASIC METABOLIC PANEL WITH GFR
Anion gap: 12 (ref 5–15)
BUN: 16 mg/dL (ref 6–20)
CO2: 27 mmol/L (ref 22–32)
Calcium: 8.5 mg/dL — ABNORMAL LOW (ref 8.9–10.3)
Chloride: 99 mmol/L (ref 98–111)
Creatinine, Ser: 1.37 mg/dL — ABNORMAL HIGH (ref 0.61–1.24)
GFR, Estimated: >60 mL/min (ref >60)
Glucose, Bld: 117 mg/dL — ABNORMAL HIGH (ref 70–99)
Potassium: 3.3 mmol/L — ABNORMAL LOW (ref 3.5–5.1)
Sodium: 138 mmol/L (ref 135–145)

## 2024-02-21 LAB — CBC
HCT: 39.9 % (ref 39.0–52.0)
Hemoglobin: 12.7 g/dL — ABNORMAL LOW (ref 13.0–17.0)
MCH: 27 pg (ref 26.0–34.0)
MCHC: 31.8 g/dL (ref 30.0–36.0)
MCV: 84.7 fL (ref 80.0–100.0)
Platelets: 209 10*3/uL (ref 150–400)
RBC: 4.71 MIL/uL (ref 4.22–5.81)
RDW: 16.2 % — ABNORMAL HIGH (ref 11.5–15.5)
WBC: 5 10*3/uL (ref 4.0–10.5)
nRBC: 0 % (ref 0.0–0.2)

## 2024-02-21 LAB — PROCALCITONIN: Procalcitonin: 0.29 ng/mL

## 2024-02-21 LAB — ECHOCARDIOGRAM LIMITED
Est EF: >55
Height: 67 in
S' Lateral: 2.5 cm
Weight: 5220.49 [oz_av]

## 2024-02-21 LAB — GLUCOSE, CAPILLARY
Glucose-Capillary: 124 mg/dL — ABNORMAL HIGH (ref 70–99)
Glucose-Capillary: 145 mg/dL — ABNORMAL HIGH (ref 70–99)
Glucose-Capillary: 138 mg/dL — ABNORMAL HIGH (ref 70–99)
Glucose-Capillary: 130 mg/dL — ABNORMAL HIGH (ref 70–99)

## 2024-02-21 LAB — PROTIME-INR
INR: 1.9 — ABNORMAL HIGH (ref 0.8–1.2)
Prothrombin Time: 22 s — ABNORMAL HIGH (ref 11.4–15.2)

## 2024-02-21 LAB — BRAIN NATRIURETIC PEPTIDE: B Natriuretic Peptide: 141.2 pg/mL — ABNORMAL HIGH (ref 0.0–100.0)

## 2024-02-21 LAB — MAGNESIUM: Magnesium: 1.8 mg/dL (ref 1.7–2.4)

## 2024-02-21 LAB — PHOSPHORUS: Phosphorus: 2.7 mg/dL (ref 2.5–4.6)

## 2024-02-21 MED ORDER — SPIRONOLACTONE 25 MG PO TABS
50.0000 mg | ORAL_TABLET | Freq: Every day | ORAL | Status: DC
  Administered 2024-02-21 – 2024-02-26 (×6): 50 mg via ORAL
  Filled 2024-02-21 (×6): qty 2

## 2024-02-21 MED ORDER — ENOXAPARIN SODIUM 150 MG/ML IJ SOSY
150.0000 mg | PREFILLED_SYRINGE | Freq: Two times a day (BID) | INTRAMUSCULAR | Status: DC
  Administered 2024-02-21 – 2024-02-22 (×4): 150 mg via SUBCUTANEOUS
  Filled 2024-02-21 (×5): qty 1

## 2024-02-21 MED ORDER — MAGNESIUM SULFATE 4 GM/100ML IV SOLN
4.0000 g | Freq: Once | INTRAVENOUS | Status: AC
  Administered 2024-02-21: 4 g via INTRAVENOUS
  Filled 2024-02-21: qty 100

## 2024-02-21 MED ORDER — POTASSIUM CHLORIDE CRYS ER 20 MEQ PO TBCR
40.0000 meq | EXTENDED_RELEASE_TABLET | Freq: Once | ORAL | Status: AC
  Administered 2024-02-21: 40 meq via ORAL
  Filled 2024-02-21: qty 2

## 2024-02-21 MED ORDER — AZITHROMYCIN 250 MG PO TABS: 500.0000 mg | ORAL_TABLET | Freq: Every day | ORAL | Status: DC

## 2024-02-21 MED ORDER — WARFARIN SODIUM 10 MG PO TABS
10.0000 mg | ORAL_TABLET | Freq: Every day | ORAL | Status: DC
  Administered 2024-02-21 – 2024-02-22 (×2): 10 mg via ORAL
  Filled 2024-02-21 (×3): qty 1

## 2024-02-21 MED ORDER — FUROSEMIDE 10 MG/ML IJ SOLN
80.0000 mg | Freq: Once | INTRAMUSCULAR | Status: AC
  Administered 2024-02-21: 80 mg via INTRAVENOUS
  Filled 2024-02-21: qty 8

## 2024-02-21 MED ORDER — FUROSEMIDE 10 MG/ML IJ SOLN: 80.0000 mg | Freq: Once | INTRAMUSCULAR | Status: DC

## 2024-02-21 MED ORDER — TORSEMIDE 20 MG PO TABS: 60.0000 mg | ORAL_TABLET | Freq: Every day | ORAL | Status: DC

## 2024-02-21 NOTE — Progress Notes (Signed)
 Advanced Heart Failure Rounding Note  Cardiologist: Dr. Zenaida   Chief Complaint:  Dyspnea    Patient Profile   Patient is a chronically ill 53 year old male with extensive family history of clotting disorders. 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 Per chart review, has MTHFR gene mutation and was previously followed by Hematology (Dr. Darcy). There has been some concern for poor compliance with anticoagulation. Hypercoagulable workup has been unrevealing. 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  Multiple hospitalizations in the past several months.  9/24: admitted w/ recurrent PE s/p mechanical thrombectomy  10/24: readmitted w/ a/c respiratory failure. CTA showed persistent nonocclusive thrombus in the right main pulmonary artery as well as new small left upper lobe segmental PE  12/24: Readmitted in 12/24 with worsening dyspnea d/t acute on chronic RV failure. CTA with resolution of prior left-sided PE and stable chronic right-sided PE   Now readmitted w/ a/c hypoxic respiratory failure and a/c RV failure + acute C-diff infection  Admission CTA of Chest showed no acute PE. Re demonstrated findings for chronic PE w/ evidence of CTEPH. Admit INR subtherapeutic at 1.6.     Pertinent Studies:    RHC + Pulmonary Angiography (Prior to this admission) 12/16/23  HEMODYNAMICS: RA:       13 mmHg (mean) RV:       110/4, 13 mmHg PA:       110/36 mmHg (60 mean) PCWP: 12 mmHg (mean)                                      Estimated Fick CO/CI   5.07L/min, 1.96L/min/m2       Thermodilution CO/CI   5.15L/min, 1.99L/min/m2                                      TPG  48  mmHg                                               PVR  9.3 Wood Units  PAPi  5.7       IMPRESSION: Right heart catheterization and pulmonary  angiography for evaluation of PH while on 0.25mcg/kg/min of milrinone  Severe pulmonary hypertension with mild improvement in PVR since last RHC Improved cardiac output by thermodilution Mildly elevated right, normal left sided filling pressures Selective pulmonary angiography with evidence of disease in the left PA, segmental and subsegmental in anteromedial basal territory, likely segmental disease in the right upper lobe. Images somewhat limited by camera acquisition and body habitus.  Interval Hx/ Subjective:    W/ increased O2 requirements past 24 hrs, up from 7L>>>15L HHFNC today.   CXR yesterday w/ increased left perihilar interstitial and patchy opacities, which may represent pulmonary edema or infection. WBC WNL at 5.0 PCT pending F/u BNP pending   Diuresing w/ IV Lasix ,1.9L out yesterday. AKI improving, SCr 2.0>>1.48>>1.37 today  CVP readings variable. I personally checked at got 9. RN got 12. Reading as high as 16 overnight.   On Vanc +cefazolin  for C-diff + Ecoli. Initial BCx + staph epi (likely  contaminate). Repeat BCx NGTD.     Objective:   Weight Range: (!) 148 kg Body mass index is 51.1 kg/m.   Vital Signs:   Temp:  [98 F (36.7 C)-98.6 F (37 C)] 98 F (36.7 C) (06/24 0320) Pulse Rate:  [82-97] 93 (06/24 0700) Resp:  [14-26] 21 (06/24 0700) BP: (102-131)/(62-80) 102/70 (06/24 0700) SpO2:  [88 %-96 %] 93 % (06/24 0700) Weight:  [148 kg] 148 kg (06/24 0400) Last BM Date : 02/20/24  Weight change: Filed Weights   02/19/24 0500 02/20/24 0355 02/21/24 0400  Weight: (!) 162.1 kg (!) 166.9 kg (!) 148 kg    Intake/Output:   Intake/Output Summary (Last 24 hours) at 02/21/2024 0733 Last data filed at 02/21/2024 0600 Gross per 24 hour  Intake 2506.65 ml  Output 1900 ml  Net 606.65 ml      Physical Exam    General:  obese, chronically ill appearing. On HHFNC but normal WOB HEENT: normal Neck: supple. Thick neck, JVD not well visualized. Carotids 2+  bilat; no bruits. No lymphadenopathy or thyromegaly appreciated. Cor: PMI nondisplaced. Regular rate & rhythm.  Lungs: decreased BS at the bases bilaterally  Abdomen: obese, distended, NT  Extremities: obese extremities, no pitting edema. + chronic venous stasis dermatitis  Neuro: alert & oriented x 3, cranial nerves grossly intact. moves all 4 extremities w/o difficulty. Affect pleasant.  Telemetry   Sinus tach low 100s, personally reviewed   EKG    Sinus tachy low 100s w/ frequent PVCs/ ventricular trigeminy, personally reviewed    Labs    CBC Recent Labs    02/20/24 0248 02/21/24 0409  WBC 4.8 5.0  HGB 12.1* 12.7*  HCT 39.0 39.9  MCV 84.4 84.7  PLT 188 209   Basic Metabolic Panel Recent Labs    93/76/74 0248 02/21/24 0409  NA 136 138  K 3.4* 3.3*  CL 104 99  CO2 24 27  GLUCOSE 105* 117*  BUN 19 16  CREATININE 1.48* 1.37*  CALCIUM  8.1* 8.5*  MG 1.9 1.8  PHOS 3.0 2.7   Liver Function Tests No results for input(s): AST, ALT, ALKPHOS, BILITOT, PROT, ALBUMIN  in the last 72 hours.  No results for input(s): LIPASE, AMYLASE in the last 72 hours. Cardiac Enzymes No results for input(s): CKTOTAL, CKMB, CKMBINDEX, TROPONINI in the last 72 hours.  BNP: BNP (last 3 results) Recent Labs    09/19/23 2216 10/24/23 1410 02/17/24 1037  BNP 280.4* 84.4 424.2*    ProBNP (last 3 results) No results for input(s): PROBNP in the last 8760 hours.   D-Dimer No results for input(s): DDIMER in the last 72 hours. Hemoglobin A1C No results for input(s): HGBA1C in the last 72 hours. Fasting Lipid Panel No results for input(s): CHOL, HDL, LDLCALC, TRIG, CHOLHDL, LDLDIRECT in the last 72 hours. Thyroid  Function Tests No results for input(s): TSH, T4TOTAL, T3FREE, THYROIDAB in the last 72 hours.  Invalid input(s): FREET3  Other results:   Imaging    DG Chest Port 1 View Result Date: 02/20/2024 CLINICAL DATA:   Shortness of breath EXAM: PORTABLE CHEST 1 VIEW COMPARISON:  Chest radiograph dated 02/18/2024 FINDINGS: Lines/tubes: Right internal jugular venous catheter tip projects over the superior cavoatrial junction. Lungs: Mildly low lung volumes. Increased left perihilar interstitial and patchy opacities. Pleura: No pneumothorax or pleural effusion. Heart/mediastinum: Similar enlarged cardiomediastinal silhouette. Bones: No acute osseous abnormality. IMPRESSION: 1. Increased left perihilar interstitial and patchy opacities, which may represent pulmonary edema or infection. 2. Similar cardiomegaly. Electronically  Signed   By: Limin  Xu M.D.   On: 02/20/2024 16:50   IR Fluoro Guide CV Line Right Result Date: 02/20/2024 INDICATION: Partially retracted tunneled dual lumen PICC line in the right IJ. Cough is been exposed. Planned exchange for a new working tunneled PICC line. EXAM: Venous access exchange MEDICATIONS: Ancef  3 gm IV; The antibiotic was administered within an appropriate time interval prior to skin puncture. ANESTHESIA/SEDATION: Moderate (conscious) sedation was employed during this procedure. A total of Versed  0 mg and Fentanyl  0 mcg was administered intravenously by the radiology nurse. Total intra-service moderate Sedation Time: 0 minutes. The patient's level of consciousness and vital signs were monitored continuously by radiology nursing throughout the procedure under my direct supervision. FLUOROSCOPY: Radiation Exposure Index (as provided by the fluoroscopic device): 0.1 minute 1 mGy Kerma COMPLICATIONS: None immediate. PROCEDURE: Informed written consent was obtained from the patient after a thorough discussion of the procedural risks, benefits and alternatives. All questions were addressed. Maximal Sterile Barrier Technique was utilized including caps, mask, sterile gowns, sterile gloves, sterile drape, hand hygiene and skin antiseptic. A timeout was performed prior to the initiation of the procedure.  Initial fluoroscopic images of the patient in a supine position demonstrated the catheter to be partially retracted within the venous system. One of the 2 lumens in this dual lumen PICC line would not aspirate. The retention suture and catheter were cut. An 018 guidewire then advanced through the remaining PICC line and the PICC line was removed from the patient. The PICC line was examined and had a length of 25 cm. The new dual lumen tunneled PICC line was cut to 25 cm and advanced over the guidewire under fluoroscopic guidance until the catheter tip was at the cavoatrial junction. The guidewire was removed. Retention suture applied. The catheter was tested for function and found to be functioning well. Both lumens were then capped. IMPRESSION: Successful fluoroscopic guided exchange of a tunneled dual lumen PICC line the right IJ as described above. Electronically Signed   By: Cordella Banner   On: 02/20/2024 13:12     Medications:     Scheduled Medications:  Chlorhexidine  Gluconate Cloth  6 each Topical Daily   cyanocobalamin   1,000 mcg Intramuscular Daily   Followed by   NOREEN ON 02/25/2024] vitamin B-12  1,000 mcg Oral Daily   insulin  aspart  0-5 Units Subcutaneous QHS   insulin  aspart  0-9 Units Subcutaneous TID WC   multivitamin with minerals  1 tablet Oral Daily   pantoprazole   40 mg Oral Daily   potassium chloride   40 mEq Oral Once   Riociguat   2.5 mg Oral TID   saccharomyces boulardii  250 mg Oral BID   spironolactone   50 mg Oral Daily   vancomycin   125 mg Oral QID   Vitamin D  (Ergocalciferol )  50,000 Units Oral Q7 days   warfarin  8 mg Oral q1600   Warfarin - Pharmacist Dosing Inpatient   Does not apply q1600    Infusions:   ceFAZolin  (ANCEF ) IV     milrinone  0.375 mcg/kg/min (02/21/24 0630)    PRN Medications: acetaminophen  **OR** acetaminophen , albuterol , ondansetron  **OR** ondansetron  (ZOFRAN ) IV, oxyCODONE    Assessment/Plan   Acute on chronic RV Failure 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 Initial LA 2.0>>1.1>>1.3. Milrinone  increased   - Continue Milrinone  0.375 mcg/kg/min. Co-ox 64%  - Continue Adempas  2.5mg   - CVPs variable, ranging from 9-16. Given increased O2 requirements and continued improvement in SCr w/ diuresis, will continue IV Lasix  today. Give 80 mg IV x 1 and follow response.   - check f/u BNP today to assess trend   - INR remains subtherapeutic @ 1.9 (Goal 2.5-3.5). Continue Lovenox  bridge. PharmD dosing coumadin   - Repeat limited echo ordered    2. Recurrent DVT/PE - Chronic, stable PE on CTA this admit.  - INR subtherapeutic on admission, 1.6  - Coumadin  + Lovenox  bridge per pharmD    3. Atrial fibrillation/flutter - currently in sinus tach, monitor w/ milrinone  - on Coumadin     4. AKI on CKD IIIa - b/l SCr ~1.5-1.7 - SCr  elevated at 2.0 on admit - likely cardiorenal - improving w/ diuresis and increased milrinone  support - SCr down to 1.37 today   - follow BMP   5. C-Diff Infection  - GI panel + for Ecoli  - Initial BCx + for Staph Epi (likely contaminant). Repeat BCx NGTD x 3 days   - on Vanc + cefazolin  - on saccharomyces boulardii bid   6. Hypokalemia/Hypomagnesemia   - K 3.3, Mg 1.8  - Give K supp   7. Ventricular Bigeminy - in setting of low K/Mg + milrinone   - give supp per above  - monitor on tele  8. Acute on Chronic Hypoxic Respiratory Failure - worsened past 24 hrs. O2 requirements up from 7L>>15l HHF  - CXR yesterday w/ increasing left perihilar interstitial and patchy opacities, ?  Pulm edema vs infection  - obtain f/u BNP and PCT - continue IV Lasix  per above  - wean down O2 as tolerated  - PCCM has seen. Pt felt to be high risk for cardiac arrest if intubation/mechanical ventilation pursued given RV failure and severe PH. PC team now following    Length of Stay: [phone] [place] RIGGERS  02/21/2024, 7:33 AM  Advanced Heart Failure Team Pager [phone] (M-F; 7a - 5p)  Please contact [facility] for night-coverage after hours (5p -7a ) and weekends on [email or web address]

## 2024-02-21 NOTE — Plan of Care (Signed)
  Problem: Education: Goal: Knowledge of General Education information will improve Description: Including pain rating scale, medication(s)/side effects and non-pharmacologic comfort measures Outcome: Progressing   Problem: Nutrition: Goal: Adequate nutrition will be maintained Outcome: Progressing   Problem: Coping: Goal: Level of anxiety will decrease Outcome: Progressing   Problem: Elimination: Goal: Will not experience complications related to bowel motility Outcome: Progressing Goal: Will not experience complications related to urinary retention Outcome: Progressing   Problem: Pain Managment: Goal: General experience of comfort will improve and/or be controlled Outcome: Progressing   Problem: Safety: Goal: Ability to remain free from injury will improve Outcome: Progressing   Problem: Education: Goal: Ability to verbalize understanding of medication therapies will improve Outcome: Progressing   Problem: Fluid Volume: Goal: Ability to maintain a balanced intake and output will improve Outcome: Progressing   Problem: Tissue Perfusion: Goal: Adequacy of tissue perfusion will improve Outcome: Progressing   Problem: Activity: Goal: Risk for activity intolerance will decrease Outcome: Not Progressing

## 2024-02-21 NOTE — Plan of Care (Signed)
  Problem: Education: Goal: Knowledge of General Education information will improve Description: Including pain rating scale, medication(s)/side effects and non-pharmacologic comfort measures Outcome: Not Progressing   Problem: Clinical Measurements: Goal: Ability to maintain clinical measurements within normal limits will improve Outcome: Not Progressing Goal: Respiratory complications will improve Outcome: Not Progressing Goal: Cardiovascular complication will be avoided Outcome: Progressing   Problem: Nutrition: Goal: Adequate nutrition will be maintained Outcome: Progressing   Problem: Elimination: Goal: Will not experience complications related to bowel motility Outcome: Progressing Goal: Will not experience complications related to urinary retention Outcome: Progressing   Problem: Pain Managment: Goal: General experience of comfort will improve and/or be controlled Outcome: Progressing   Problem: Metabolic: Goal: Ability to maintain appropriate glucose levels will improve Outcome: Progressing

## 2024-02-21 NOTE — Plan of Care (Signed)
     Referral previously received for Ryan [name] for goals of care discussion. Noted most recent palliative in-person assessment dated 02/20/2024.  Handoff report recommended to follow from a distance/chart check.  I had last palliative visit goals were for full code and full scope, declined HCPOA/living will documentation completion.  Chart reviewed for Recent provider notes, nurse notes, vitals, and labs and updates received from RN.   At this time patient appears stable although remains critically ill.  Attempting to indicate need for the person palliative follow-up today.  No plan for in person follow-up at this time.    We will continue to follow peripherally for significant clinical change or new palliative needs.  Please contact the palliative medicine provider on service for any new/urgent needs that require our assistance with this patient.  Thank you for your referral and allowing PMT to assist in Ryan [name]'s care.   Camellia Kays, NP Palliative Medicine Team Phone: [phone]  NO CHARGE

## 2024-02-21 NOTE — Consult Note (Signed)
 Pharmacy Consult Note - Anticoagulation  Pharmacy Consult for warfarin Indication: CTEPH and atrial fibrillation  PATIENT MEASUREMENTS: Height: 5' 7 (170.2 cm) Weight: (!) 148 kg (326 lb 4.5 oz) IBW/kg (Calculated) : 66.1 HEPARIN  DW (KG): 104.5  VITAL SIGNS: Temp: 98 F (36.7 C) (06/24 0320) Temp Source: Oral (06/24 0320) BP: 102/70 (06/24 0700) Pulse Rate: 93 (06/24 0700)  Recent Labs    02/21/24 0409  HGB 12.7*  HCT 39.9  PLT 209  LABPROT 22.0*  INR 1.9*  CREATININE 1.37*    Estimated Creatinine Clearance: 86.2 mL/min (A) (by C-G formula based on SCr of 1.37 mg/dL (H)).  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  Regular the workup has not been forthcoming) previously followed by Dr. Darcy at [facility]   Hepatic steatosis    Hypertension    Morbid obesity with BMI of 50.0-59.9, adult (HCC)    Oxygen deficiency    Peripheral vascular disease (HCC)    Presence of IVC filter    Has 2 IVC filters with significant thrombus burden superior to the filter.   Pulmonary embolus (HCC) 12/2016   Initially just treated with anticoagulation, but in February 2023 in April 2024 treated with EKOS thrombectomy for submassive PE.   Ventricular trigeminy    Weakness of both lower limbs     ASSESSMENT: 53 y.o. 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 Patient is on chronic anticoagulation per chart review. Has been on Xarelto  in the past but is currently on  warfarin with INR goal 2.5-3.5. Pharmacy has been consulted to manage warfarin while patient is admitted. INR currently subtherapeutic; no known missed doses however, per wife, medication adherence overall is question. Patient is receiving enoxaparin  until INR is therapeutic.  Pertinent medications: Warfarin 6mg  on Tuesday, Thursday, Sunday Warfarin 5mg  all other days Has 5mg  and 1mg  tablets at home Last dose 6/19 at bedtime Total weekly warfarin = 38mg   Goal(s) of therapy: INR 2.5 - 3.5 Monitor platelets by anticoagulation protocol: Yes   Date:  INR: Dose: 6/20 1.6 8 mg 6/21 2.0 8 mg 6/22 2.2 6 mg  6/23 2.0 8 mg  6/24  1.9 10 mg   PLAN: Will give warfarin 10 mg po x 1 given INR continues to trend down on doses > home regimen Continue enoxaparin  1mg /kg q12H until INR is therapeutic x 2  INR and CBC with AM labs  Adriana Bolster, PharmD, BCPS 02/21/2024 7:13 AM

## 2024-02-21 NOTE — Progress Notes (Signed)
 Heart Failure Navigator Progress Note  Assessed for Heart & Vascular TOC clinic readiness.  Patient does not meet criteria due to current Advanced Heart Failure Team patient of Ryan Brownie, MD.   Navigator will sign off at this time.   Charmaine Pines, RN, BSN [facility] Heart Failure Navigator Secure Chat Only

## 2024-02-21 NOTE — Progress Notes (Signed)
*  PRELIMINARY RESULTS* Echocardiogram 2D Echocardiogram has been performed.  Ryan [name] 02/21/2024, 2:42 PM

## 2024-02-21 NOTE — TOC Initial Note (Addendum)
 Transition of Care [facility]) - Initial/Assessment Note    Patient Details  Name: Ryan [name] MRN: [id number] Date of Birth: 05/21/1971  Transition of Care [facility]) CM/SW Contact:    Alvaro Louder, LCSW Phone Number: 02/21/2024, 3:27 PM  Clinical Narrative:  LCSWA met with patient at bedside and discussed living situation prior to being admitted.  The patient stated that he lives with his daughter [place]. The patient indicated that he is able to bathe and dress himself. Mr. Berrios stated that he could ambulate and has a shower chair that he uses at home. Mr. Panik stated that his church members and his daughter will help with transportation after discharge. He indicated that his medication is affordable.   Mr. Pitre stated that the [facility] agency [facility] provides IV milrinone  for home use. He also reports he has home O2 via [employer]. Mr. Beckerman requests a smaller portable Oxygen tank.   TOC will follow up with [employer].  Case Manager Nichole spoke with Holley with [facility] and she reports Mr. Braithwaite is an active patient. Nichole Also Spoke with [employer] and patient will need to call [employer] once discharge to evaluated for smaller portable tanks.   TOC will continue to follow for discharge planning.     Barriers to Discharge: Continued Medical Work up   Patient Goals and CMS Choice            Expected Discharge Plan and Services   Discharge Planning Services: CM Consult   Living arrangements for the past 2 months: Single Family Home                                      Prior Living Arrangements/Services Living arrangements for the past 2 months: Single Family Home Lives with:: Self Patient language and need for interpreter reviewed:: Yes Do you feel safe going back to the place where you live?: Yes      Need for Family Participation in Patient Care: Yes (Comment) Care giver support system in place?: Yes (comment)   Criminal Activity/Legal Involvement Pertinent to Current  Situation/Hospitalization: No - Comment as needed  Activities of Daily Living   ADL Screening (condition at time of admission) Independently performs ADLs?: Yes (appropriate for developmental age) Is the patient deaf or have difficulty hearing?: No Does the patient have difficulty seeing, even when wearing glasses/contacts?: No Does the patient have difficulty concentrating, remembering, or making decisions?: No  Permission Sought/Granted Permission sought to share information with : Case Manager                Emotional Assessment Appearance:: Appears stated age Attitude/Demeanor/Rapport: Engaged Affect (typically observed): Appropriate Orientation: : Oriented to Self, Oriented to Place, Oriented to  Time, Oriented to Situation      Admission diagnosis:  Cor pulmonale (HCC) [I27.81] Acute respiratory failure with hypoxia (HCC) [J96.01] 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] Patient Active Problem List   Diagnosis Date Noted   Acute respiratory failure with hypoxia (HCC) 02/20/2024   Cor pulmonale (HCC) 02/17/2024   AKI (acute kidney injury) (HCC) 02/17/2024   Hyponatremia 02/17/2024   Malpositioned central line 02/17/2024   Chronic hypoxic respiratory failure, on home oxygen therapy (HCC) 12/06/2023   Hospital discharge follow-up 10/14/2023   Central sleep apnea 08/20/2023   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 08/20/2023   S/P insertion of IVC (inferior vena caval) filter 08/20/2023   Diabetes mellitus treated  with injections of non-insulin  medication (HCC) 08/20/2023   Right ventricular failure (HCC) 08/20/2023   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/20/2023   Paroxysmal atrial fibrillation (HCC) 08/18/2023   Current use of long term anticoagulation 07/17/2023   Pulmonary hypertension, unspecified (HCC) 06/18/2023   Cor pulmonale (chronic) (HCC) 05/16/2023   Other neutropenia (HCC) 01/19/2023   Anticoagulation goal  of INR 2.5 to 3.5 12/24/2022   Rheumatoid factor positive 04/28/2022   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) 02/09/2022   At risk for sleep apnea 01/27/2022   VTE (venous thromboembolism) 01/26/2022   Chronic diastolic (congestive) heart failure (HCC) 01/04/2022   Clotting disorder (HCC) 10/19/2021   Angina at rest [facility]) 07/29/2021   Hyperlipidemia associated with type 2 diabetes mellitus (HCC) 07/29/2021   Weakness of both lower limbs 11/17/2017   Type 2 diabetes mellitus with diabetic chronic kidney disease (HCC) 08/03/2017   CKD (chronic kidney disease), stage IIIa 08/03/2017   Chronic pulmonary embolism (HCC) 01/03/2017   Acute diastolic CHF (congestive heart failure) (HCC) 12/15/2016   Essential hypertension 12/15/2016   PVC (premature ventricular contraction) 11/24/2016   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   Hepatic steatosis 05/30/2013   Morbid obesity with BMI of 50.0-59.9, adult (HCC) 05/30/2013   Proteinuria 05/30/2013   PCP:  Marylynn Verneita CROME, MD Pharmacy:   [facility] [phone] [place], [place] - [address] AT [facility] OF [address] [address] [place] [address] Phone: [phone] Fax: [phone]  [facility] [address] [place] [address] Phone: [phone] Fax: [phone]     Social Drivers of Health (SDOH) Social History: SDOH Screenings   Food Insecurity: No Food Insecurity (02/17/2024)  Housing: Low Risk  (02/17/2024)  Transportation Needs: Unmet Transportation Needs (02/17/2024)  Utilities: Not At Risk (02/17/2024)  Depression (PHQ2-9): Low Risk  (12/05/2023)  Financial Resource Strain: Low Risk  (08/18/2023)  Physical Activity: Insufficiently Active (08/18/2023)  Social Connections: Moderately Isolated (10/10/2023)  Stress: No Stress Concern Present (08/18/2023)  Tobacco Use: Low Risk  (02/17/2024)   SDOH Interventions: Food Insecurity Interventions:  Intervention Not Indicated Housing Interventions: Intervention Not Indicated Transportation Interventions: Contracted Vendor Utilities Interventions: Intervention Not Indicated   Readmission Risk Interventions    09/23/2023    4:32 PM  Readmission Risk Prevention Plan  Transportation Screening Complete  PCP or Specialist Appt within 3-5 Days Complete  HRI or Home Care Consult Complete  Social Work Consult for Recovery Care Planning/Counseling Complete  Palliative Care Screening Not Applicable  Medication Review [employer]) Complete

## 2024-02-21 NOTE — Progress Notes (Signed)
 [facility] Progress Note  Patient: Ryan [name]    FMW:[id number]  DOA: 02/17/2024     Date of Service: the patient was seen and examined on 02/21/2024  Chief Complaint  Patient presents with   Shortness of Breath   Brief hospital course: 53 y.o. M with CTEPH and pHTN on milrinone , dCHF, chronic respiratory failure on 2L, HTN, DM, CKD IIIa baseline 1.4, hx IVC filter, on warfarin due to Eliquis /Xarelto  failures, and MO who presented with cough, worsening SOB now at rest and syncope.   Evidently started to feel generalized malaise 4-5 days PTA.  Had vomiting, unable to eat and 1-2 loose watery stools per day.  Got weaker and weaker until 6/20, he went to drive to his aunts house to meet his milrinone  infusion HHRN but when he got there, he walked in the house and passed out in a chair, so she called 9-1-1.  EMS found that he had a fever, required 10L O2 to keep sats >90%.   In the ER, CXR showed malpositioned CVC but no infiltrates.  CTA chest showed chronic PE, no new findings, no pulmonary edema.   INR subtherapeutic.  Cr up to 2.  Lactate normal.   No fever here.  Given empiric antibiotics, IV Lasix .  Case discussed with advanced HF team Dr. Gardenia who stated they would see the patient.   Wife gave collateral that patient now lives alone, eats only takeout food and she has concerns that his memory is sometimes impaired and she isn't sure he is taking his medicines.   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  Repeated x-ray for confirmation  IR consulted, 25cm DL tunneled PICC line exchange was done on 6/23 Due to continuous CBC monitoring patient was transferred to stepdown on 6/23  6/23 respiratory failure deteriorated,  CXR: Increased left perihilar interstitial and patchy opacities, which may represent pulmonary edema or infection. Similar cardiomegaly. PCCM consulted, recommended poor prognosis, palliative care consulted.   Acute gastroenteritis secondary to C. difficile and E. Coli C. difficile antigen and PCR toxin positive GI pathogen positive for E. Coli Patient presented with fever, diarrhea and nausea vomiting. DC'd broad-spectrum antibiotics vancomycin  and cefepime  Discussed with ID, recommended to start vancomycin  orally Q6 hourly for 10 days No need to treat E. Coli Started probiotics   # AKI on CKD IIIa Baseline sCr 1.4 sCr 2.0 on admission, sCr 1.96 --1.48 Bladder scan ruled out urinary retention US  renal: Ruled out hydronephrosis, bladder distention, could not rule out urinary retention, PVR was not done Avoid nephrology medication, your renal dose medication Monitor renal functions and urine output 6/22 RN was advised to do bladder scan every shift   # Metabolic acidosis due to renal failure Bicarb IV 150 mEq given.  Acidosis resolved, bicarbonate is now within normal range. Follow BMP and replete as needed  #Paroxysmal A-fib Currently patient is in sinus Continue Coumadin  and Lovenox  as above  # HTN, HLD, dHF Held  Home torsemide  Continue Lasix  as above  # Type 2 diabetes A1c <7% recently.  Diet controlled - SS correction insulin    # Hypokalemia, potassium repleted. Monitor BMP daily  # Hyponatremia Mild - Diuresis as above and trend   # Iron  deficiency, transferrin saturation 5%, started Venofer  3 mg IV  daily x 3 doses followed by oral supplement at discharge.  # Vitamin D  deficiency: started vitamin D  50,000 units p.o. weekly, follow with PCP to repeat vitamin D  level after 3 to 6 months.  # Vitamin B12 deficiency: Started vitamin B12 1000 mcg IM injection daily during hospital stay, followed by oral supplement.  Follow-up PCP to repeat vitamin B12 level after 3 to 6 months.  # Obstructive sleep apnea, continue CPAP  # Obesity class III, morbid obesity Body mass index is 51.1 kg/m.  Interventions: Calorie restricted diet and daily exercise advised to lose body weight.  Lifestyle modification discussed.   Diet: Heart healthy/carb modified diet DVT Prophylaxis: Subcutaneous Lovenox  and Coumadin   Advance goals of care discussion: Full code  Family Communication: family was present at bedside, at the time of interview.  The pt provided permission to discuss medical plan with the family. Opportunity was given to ask question and all questions were answered satisfactorily.   Disposition:  Pt is from home, admitted with Resp Failure due to CHF and C. difficile/E. coli gastroenteritis, still has Resp Failure, which precludes a safe discharge. Discharge to ome, when stable, may need few days to improve.  Subjective: No significant events overnight, diarrhea resolved, patient is making enough urine, feels improvement in the lower extremity edema.  Denies any specific complaints, Still patient is requiring high flow oxygen.  Patient was encouraged to use BiPAP, patient is willing to try nasal BiPAP if it is available.   Physical Exam: General: NAD, lying comfortably Appear in no distress, affect appropriate Eyes: PERRLA ENT: Oral Mucosa Clear, moist  Neck: no JVD,  Cardiovascular: S1 and S2 Present, no Murmur,  Respiratory: good respiratory effort, Bilateral Air entry equal and Decreased, no Crackles, no wheezes Abdomen: BS present, Soft, obese and no tenderness,  Skin: no  rashes Extremities: 4+ edema, with chronic venous stasis pigmentation  Neurologic: without any new focal findings Gait not checked due to patient safety concerns  Vitals:   02/21/24 1200 02/21/24 1300 02/21/24 1400 02/21/24 1500  BP: 108/80 104/78 123/72 132/82  Pulse: 95 93 93 87  Resp: 17 20 (!) 21 17  Temp:      TempSrc:      SpO2: 94% 92% 91% 94%  Weight:      Height:        Intake/Output Summary (Last 24 hours) at 02/21/2024 1607 Last data filed at 02/21/2024 1200 Gross per 24 hour  Intake 3124.62 ml  Output 2300 ml  Net 824.62 ml   Filed Weights   02/19/24 0500 02/20/24 0355 02/21/24 0400  Weight: (!) 162.1 kg (!) 166.9 kg (!) 148 kg    Data Reviewed: I have personally reviewed and interpreted daily labs, tele strips, imagings as discussed above. I reviewed all nursing notes, pharmacy notes, vitals, pertinent old records I have discussed plan of care as described above with RN and patient/family.  CBC: Recent Labs  Lab 02/17/24 1037 02/18/24 0503 02/19/24 0450 02/20/24 0248 02/21/24 0409  WBC 6.5 4.4 4.9 4.8 5.0  NEUTROABS 4.9  --   --   --   --   HGB 13.0 11.9* 12.2* 12.1* 12.7*  HCT 41.4 37.9*  38.2* 39.0 39.9  MCV 85.0 85.7 85.3 84.4 84.7  PLT 187 147* 151 188 209   Basic Metabolic Panel: Recent Labs  Lab 02/18/24 0503 02/18/24 1345 02/19/24 0450 02/20/24 0248 02/21/24 0409  NA 132* 133* 134* 136 138  K 3.3* 3.3* 3.1* 3.4* 3.3*  CL 103 102 102 104 99  CO2 18* 19* 24 24 27   GLUCOSE 103* 127* 108* 105* 117*  BUN 21* 21* 20 19 16   CREATININE 1.96* 1.93* 1.71* 1.48* 1.37*  CALCIUM  8.2* 8.0* 8.0* 8.1* 8.5*  MG  --  1.9 1.9 1.9 1.8  PHOS  --  2.5 3.1 3.0 2.7    Studies: DG Chest Port 1 View Result Date: 02/20/2024 CLINICAL DATA:  Shortness of breath EXAM: PORTABLE CHEST 1 VIEW COMPARISON:  Chest radiograph dated 02/18/2024 FINDINGS: Lines/tubes: Right internal jugular venous catheter tip projects over the superior cavoatrial junction. Lungs:  Mildly low lung volumes. Increased left perihilar interstitial and patchy opacities. Pleura: No pneumothorax or pleural effusion. Heart/mediastinum: Similar enlarged cardiomediastinal silhouette. Bones: No acute osseous abnormality. IMPRESSION: 1. Increased left perihilar interstitial and patchy opacities, which may represent pulmonary edema or infection. 2. Similar cardiomegaly. Electronically Signed   By: Limin  Xu M.D.   On: 02/20/2024 16:50     Scheduled Meds:  azithromycin   500 mg Oral Daily   Chlorhexidine  Gluconate Cloth  6 each Topical Daily   cyanocobalamin   1,000 mcg Intramuscular Daily   Followed by   NOREEN ON 02/25/2024] vitamin B-12  1,000 mcg Oral Daily   enoxaparin  (LOVENOX ) injection  150 mg Subcutaneous Q12H   insulin  aspart  0-5 Units Subcutaneous QHS   insulin  aspart  0-9 Units Subcutaneous TID WC   multivitamin with minerals  1 tablet Oral Daily   pantoprazole   40 mg Oral Daily   Riociguat   2.5 mg Oral TID   saccharomyces boulardii  250 mg Oral BID   spironolactone   50 mg Oral Daily   vancomycin   125 mg Oral QID   Vitamin D  (Ergocalciferol )  50,000 Units Oral Q7 days   warfarin  10 mg Oral q1600   Warfarin - Pharmacist Dosing Inpatient   Does not apply q1600   Continuous Infusions:  milrinone  0.25 mcg/kg/min (02/21/24 1605)   PRN Meds: acetaminophen  **OR** acetaminophen , albuterol , ondansetron  **OR** ondansetron  (ZOFRAN ) IV, oxyCODONE   Time spent: 55 minutes  Author: ELVAN SOR. MD [facility] 02/21/2024 4:07 PM  To reach On-call, see care teams to locate the attending and reach out to them via [email or web address]. If 7PM-7AM, please contact night-coverage If you still have difficulty reaching the attending provider, please page the [facility] (Director on Call) for [facility] on amion for assistance.

## 2024-02-22 ENCOUNTER — Inpatient Hospital Stay

## 2024-02-22 DIAGNOSIS — I2781 Cor pulmonale (chronic): Secondary | ICD-10-CM | POA: Diagnosis not present

## 2024-02-22 DIAGNOSIS — I50810 Right heart failure, unspecified: Secondary | ICD-10-CM | POA: Diagnosis not present

## 2024-02-22 DIAGNOSIS — Z515 Encounter for palliative care: Secondary | ICD-10-CM

## 2024-02-22 LAB — BLOOD GAS, ARTERIAL
Acid-Base Excess: 7.5 mmol/L — ABNORMAL HIGH (ref 0.0–2.0)
Bicarbonate: 31.1 mmol/L — ABNORMAL HIGH (ref 20.0–28.0)
O2 Saturation: 95.4 %
Patient temperature: 37
pCO2 arterial: 39 mmHg (ref 32–48)
pH, Arterial: 7.51 — ABNORMAL HIGH (ref 7.35–7.45)
pO2, Arterial: 72 mmHg — ABNORMAL LOW (ref 83–108)

## 2024-02-22 LAB — CBC
HCT: 42.4 % (ref 39.0–52.0)
Hemoglobin: 13.3 g/dL (ref 13.0–17.0)
MCH: 26.4 pg (ref 26.0–34.0)
MCHC: 31.4 g/dL (ref 30.0–36.0)
MCV: 84.1 fL (ref 80.0–100.0)
Platelets: 261 10*3/uL (ref 150–400)
RBC: 5.04 MIL/uL (ref 4.22–5.81)
RDW: 16 % — ABNORMAL HIGH (ref 11.5–15.5)
WBC: 4.7 10*3/uL (ref 4.0–10.5)
nRBC: 0 % (ref 0.0–0.2)

## 2024-02-22 LAB — CULTURE, BLOOD (ROUTINE X 2)
Culture: NO GROWTH
Special Requests: ADEQUATE

## 2024-02-22 LAB — GLUCOSE, CAPILLARY
Glucose-Capillary: 128 mg/dL — ABNORMAL HIGH (ref 70–99)
Glucose-Capillary: 118 mg/dL — ABNORMAL HIGH (ref 70–99)
Glucose-Capillary: 113 mg/dL — ABNORMAL HIGH (ref 70–99)
Glucose-Capillary: 123 mg/dL — ABNORMAL HIGH (ref 70–99)

## 2024-02-22 LAB — BASIC METABOLIC PANEL WITH GFR
Anion gap: 9 (ref 5–15)
BUN: 18 mg/dL (ref 6–20)
CO2: 26 mmol/L (ref 22–32)
Calcium: 9.1 mg/dL (ref 8.9–10.3)
Chloride: 101 mmol/L (ref 98–111)
Creatinine, Ser: 1.32 mg/dL — ABNORMAL HIGH (ref 0.61–1.24)
GFR, Estimated: >60 mL/min (ref >60)
Glucose, Bld: 127 mg/dL — ABNORMAL HIGH (ref 70–99)
Potassium: 3.8 mmol/L (ref 3.5–5.1)
Sodium: 136 mmol/L (ref 135–145)

## 2024-02-22 LAB — PROTIME-INR
INR: 2.1 — ABNORMAL HIGH (ref 0.8–1.2)
Prothrombin Time: 24.2 s — ABNORMAL HIGH (ref 11.4–15.2)

## 2024-02-22 LAB — COOXEMETRY PANEL
Carboxyhemoglobin: 1 % (ref 0.5–1.5)
Carboxyhemoglobin: 1 % (ref 0.5–1.5)
Methemoglobin: 0.9 % (ref 0.0–1.5)
Methemoglobin: <0.7 % (ref 0.0–1.5)
O2 Saturation: 79.2 %
O2 Saturation: 71.2 %
Total hemoglobin: 14.1 g/dL (ref 12.0–16.0)
Total hemoglobin: 12.9 g/dL (ref 12.0–16.0)
Total oxygen content: 77.7 %
Total oxygen content: 70.2 %

## 2024-02-22 LAB — MAGNESIUM: Magnesium: 2.3 mg/dL (ref 1.7–2.4)

## 2024-02-22 LAB — PHOSPHORUS: Phosphorus: 3.3 mg/dL (ref 2.5–4.6)

## 2024-02-22 MED ORDER — TORSEMIDE 20 MG PO TABS
60.0000 mg | ORAL_TABLET | Freq: Every day | ORAL | Status: DC
  Administered 2024-02-22: 60 mg via ORAL
  Filled 2024-02-22: qty 3

## 2024-02-22 MED ORDER — POTASSIUM CHLORIDE CRYS ER 20 MEQ PO TBCR
20.0000 meq | EXTENDED_RELEASE_TABLET | Freq: Once | ORAL | Status: AC
  Administered 2024-02-22: 20 meq via ORAL
  Filled 2024-02-22: qty 1

## 2024-02-22 NOTE — Progress Notes (Signed)
 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   EMR reviewed. (extensive family history of clotting disorders. 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 Per chart review, has MTHFR gene mutation and was previously followed by Hematology (Dr. Darcy). There has been some concern for poor compliance with anticoagulation. 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  Had vomiting, unable to eat and 1-2 loose watery stools per day.  Got weaker and weaker until 6/20, he went to drive to his aunts house to meet his milrinone  infusion HHRN but when he got there, he walked in the house and passed out in a chair, so she called 9-1-1.  EMS found that he had a fever, required 10L O2 to keep sats >90%.   In the ER, CXR showed malpositioned CVC but no infiltrates.  CTA chest showed chronic PE, no new findings, no pulmonary edema.   INR subtherapeutic.  Cr up to 2.  Lactate normal.   No fever here.  Given empiric antibiotics, IV Lasix .  Case discussed with advanced HF team Dr. Gardenia who stated they would see the patient.   Wife gave collateral that patient now lives alone, eats only takeout food and she has concerns that his memory is sometimes impaired and she isn't sure he is taking his medicines.   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 daily Continue milrinone  IV infusion Continue Lovenox  and Coumadin , pharmacy consulted for dosing and INR monitoring Cardiology following Discussed with ID, Bld cx most likely contamination 6/21 repeat blood cultures, NGTD Right tunneled catheter/CVC, catheter tip flipped back which was corrected with power flush.  Repeated x-ray for confirmation  IR consulted, 25cm DL tunneled PICC line exchange was done on 6/23 Due to continuous CVP monitoring patient was transferred to stepdown on 6/23  6/23 respiratory failure deteriorated,  CXR: Increased left perihilar interstitial and patchy opacities, which may represent pulmonary edema or infection. Similar cardiomegaly. PCCM consulted, recommended poor prognosis, palliative care consulted.   Acute gastroenteritis secondary to C. difficile and E. Coli C. difficile antigen and PCR toxin positive GI pathogen positive for E. Coli Patient presented with fever, diarrhea and nausea vomiting. DC'd broad-spectrum antibiotics vancomycin  and cefepime  Discussed with ID, recommended to start vancomycin  orally Q6 hourly for 10 days No need to treat E. Coli Started probiotics   # AKI on CKD IIIa Baseline sCr 1.4 sCr 2.0 on admission, sCr 1.96 --1.48 Bladder scan ruled out urinary retention US  renal: Ruled out hydronephrosis, bladder distention, could not rule out urinary retention, PVR was not done Avoid nephrology medication, your renal dose medication Monitor renal functions and urine output 6/22  RN was advised to do bladder scan every shift   # Metabolic acidosis due to renal failure Bicarb IV 150 mEq given.  Acidosis resolved, bicarbonate is now within normal range. Follow BMP and replete as needed  # Paroxysmal A-fib Currently patient is in  sinus Continue Coumadin  and Lovenox  as above  # HTN, HLD, dHF 6/25 resumed Torsemide  60 mg p.o. daily,  6/24 started Aldactone  50 mg p.o. daily S/p Lasix  as above  # Type 2 diabetes A1c <7% recently.  Diet controlled - SS correction insulin    # Hypokalemia, potassium repleted. Monitor BMP daily  # Hyponatremia Mild - Diuresis as above and trend   # Iron  deficiency, transferrin saturation 5%, started Venofer  3 mg IV daily x 3 doses followed by oral supplement at discharge.  # Vitamin D  deficiency: started vitamin D  50,000 units p.o. weekly, follow with PCP to repeat vitamin D  level after 3 to 6 months.  # Vitamin B12 deficiency: Started vitamin B12 1000 mcg IM injection daily during hospital stay, followed by oral supplement.  Follow-up PCP to repeat vitamin B12 level after 3 to 6 months.  # Obstructive sleep apnea, continue CPAP  # Obesity class III, morbid obesity Body mass index is 56.21 kg/m.  Interventions: Calorie restricted diet and daily exercise advised to lose body weight.  Lifestyle modification discussed.   Diet: Heart healthy/carb modified diet DVT Prophylaxis: Subcutaneous Lovenox  and Coumadin   Advance goals of care discussion: Full code  Family Communication: family was present at bedside, at the time of interview.  The pt provided permission to discuss medical plan with the family. Opportunity was given to ask question and all questions were answered satisfactorily.   Disposition:  Pt is from home, admitted with Resp Failure due to CHF and C. difficile/E. coli gastroenteritis, still has Resp Failure, which precludes a safe discharge. Discharge to ome, when stable, may need few days to improve.  Subjective: No significant events overnight, diarrhea resolved, patient was resting comfortably, still requiring significant amount of oxygen.  Denied any worsening of symptoms.  Physical Exam: General: NAD, lying comfortably Appear in no distress, affect  appropriate Eyes: PERRLA ENT: Oral Mucosa Clear, moist  Neck: no JVD,  Cardiovascular: S1 and S2 Present, no Murmur,  Respiratory: good respiratory effort, Bilateral Air entry equal and Decreased, no Crackles, no wheezes Abdomen: BS present, Soft, obese and no tenderness,  Skin: no rashes Extremities: 4+ edema, with chronic venous stasis pigmentation  Neurologic: without any new focal findings Gait not checked due to patient safety concerns  Vitals:   02/22/24 1201 02/22/24 1314 02/22/24 1400 02/22/24 1500  BP:  113/66 119/86 130/79  Pulse: 91 91 86 81  Resp:  20 (!) 25 18  Temp:      TempSrc:      SpO2: 92% 92% 95% 96%  Weight:      Height:        Intake/Output Summary (Last 24 hours) at 02/22/2024 1611 Last data filed at 02/22/2024 1520 Gross per 24 hour  Intake 1315.28 ml  Output 1400 ml  Net -84.72 ml   Filed Weights   02/20/24 0355 02/21/24 0400 02/22/24 0500  Weight: (!) 166.9 kg (!) 148 kg (!) 162.8 kg    Data Reviewed: I have personally reviewed and interpreted daily labs, tele strips, imagings as discussed above. I reviewed all nursing notes, pharmacy notes, vitals, pertinent old records I have discussed plan of care as described above with RN and patient/family.  CBC: Recent Labs  Lab 02/17/24 1037 02/18/24 0503 02/19/24 0450 02/20/24 0248 02/21/24 0409 02/22/24 0326  WBC 6.5 4.4 4.9 4.8 5.0 4.7  NEUTROABS 4.9  --   --   --   --   --   HGB 13.0 11.9* 12.2* 12.1* 12.7* 13.3  HCT 41.4 37.9* 38.2* 39.0 39.9 42.4  MCV 85.0 85.7 85.3 84.4 84.7 84.1  PLT 187 147* 151 188 209 261   Basic Metabolic Panel: Recent Labs  Lab 02/18/24 1345 02/19/24 0450 02/20/24 0248 02/21/24 0409 02/22/24 0326  NA 133* 134* 136 138 136  K 3.3* 3.1* 3.4* 3.3* 3.8  CL 102 102 104 99 101  CO2 19* 24 24 27 26   GLUCOSE 127* 108* 105* 117* 127*  BUN 21* 20 19 16 18   CREATININE 1.93* 1.71* 1.48* 1.37* 1.32*  CALCIUM  8.0* 8.0* 8.1* 8.5* 9.1  MG 1.9 1.9 1.9 1.8 2.3  PHOS  2.5 3.1 3.0 2.7 3.3    Studies: DG Chest Port 1 View Result Date: 02/22/2024 CLINICAL DATA:  200808 Hypoxia [id number] EXAM: PORTABLE CHEST - 1 VIEW COMPARISON:  02/20/2024 FINDINGS: Lungs are clear. Heart size and mediastinal contours are within normal limits. No effusion. Visualized bones unremarkable. Stable tunneled right IJ central venous catheter to the cavoatrial junction. IMPRESSION: No acute cardiopulmonary disease. Electronically Signed   By: JONETTA Faes M.D.   On: 02/22/2024 13:00     Scheduled Meds:  Chlorhexidine  Gluconate Cloth  6 each Topical Daily   cyanocobalamin   1,000 mcg Intramuscular Daily   Followed by   NOREEN ON 02/25/2024] vitamin B-12  1,000 mcg Oral Daily   enoxaparin  (LOVENOX ) injection  150 mg Subcutaneous Q12H   insulin  aspart  0-5 Units Subcutaneous QHS   insulin  aspart  0-9 Units Subcutaneous TID WC   multivitamin with minerals  1 tablet Oral Daily   pantoprazole   40 mg Oral Daily   Riociguat   2.5 mg Oral TID   saccharomyces boulardii  250 mg Oral BID   spironolactone   50 mg Oral Daily   torsemide   60 mg Oral Daily   vancomycin   125 mg Oral QID   Vitamin D  (Ergocalciferol )  50,000 Units Oral Q7 days   warfarin  10 mg Oral q1600   Warfarin - Pharmacist Dosing Inpatient   Does not apply q1600   Continuous Infusions:  milrinone  0.25 mcg/kg/min (02/22/24 1520)   PRN Meds: acetaminophen  **OR** acetaminophen , albuterol , ondansetron  **OR** ondansetron  (ZOFRAN ) IV, oxyCODONE   Time spent: 55 minutes  Author: ELVAN SOR. MD [facility] 02/22/2024 4:11 PM  To reach On-call, see care teams to locate the attending and reach out to them via [email or web address]. If 7PM-7AM, please contact night-coverage If you still have difficulty reaching the attending provider, please page the [facility] (Director on Call) for [facility] on amion for assistance.

## 2024-02-22 NOTE — Plan of Care (Signed)
  Problem: Health Behavior/Discharge Planning: Goal: Ability to manage health-related needs will improve Outcome: Progressing   Problem: Clinical Measurements: Goal: Ability to maintain clinical measurements within normal limits will improve Outcome: Progressing Goal: Will remain free from infection Outcome: Progressing Goal: Respiratory complications will improve Outcome: Progressing Goal: Cardiovascular complication will be avoided Outcome: Progressing   Problem: Nutrition: Goal: Adequate nutrition will be maintained Outcome: Progressing   Problem: Elimination: Goal: Will not experience complications related to bowel motility Outcome: Progressing Goal: Will not experience complications related to urinary retention Outcome: Progressing   Problem: Pain Managment: Goal: General experience of comfort will improve and/or be controlled Outcome: Progressing   Problem: Education: Goal: Ability to demonstrate management of disease process will improve Outcome: Progressing Goal: Ability to verbalize understanding of medication therapies will improve Outcome: Progressing   Problem: Coping: Goal: Ability to adjust to condition or change in health will improve Outcome: Progressing   Problem: Fluid Volume: Goal: Ability to maintain a balanced intake and output will improve Outcome: Progressing   Problem: Metabolic: Goal: Ability to maintain appropriate glucose levels will improve Outcome: Progressing   Problem: Nutritional: Goal: Maintenance of adequate nutrition will improve Outcome: Progressing

## 2024-02-22 NOTE — Progress Notes (Signed)
 Advanced Heart Failure Rounding Note  [facility] Cardiologist: Dr. Zenaida   Chief Complaint:  Hypoxia  Patient Profile   Patient is a chronically ill 53 year old male with extensive family history of clotting disorders. 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 Per chart review, has MTHFR gene mutation and was previously followed by Hematology (Dr. Darcy). There has been some concern for poor compliance with anticoagulation. Hypercoagulable workup has been unrevealing. 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  Multiple hospitalizations in the past several months.  9/24: admitted w/ recurrent PE s/p mechanical thrombectomy  10/24: readmitted w/ a/c respiratory failure. CTA showed persistent nonocclusive thrombus in the right main pulmonary artery as well as new small left upper lobe segmental PE  12/24: Readmitted in 12/24 with worsening dyspnea d/t acute on chronic RV failure. CTA with resolution of prior left-sided PE and stable chronic right-sided PE   Now readmitted w/ a/c hypoxic respiratory failure and a/c RV failure + acute C-diff infection. CTPE (-) for acute PE, chronic PE w/ evidence of CTEPH. Subtherapeutic INR at 1.6.    Pertinent Studies:    RHC + Pulmonary Angiography (Prior to this admission on Milrinone  0.25) 12/16/23:  RA 13, PA 110/36 (60), PCWP 12, CO/CI (TD) 5.15/1.99, TPG 48, PVR 9.3, PAPi 5.7  Selective pulmonary angiography with evidence of disease in the left PA, segmental and subsegmental in anteromedial basal territory, likely segmental disease in the right upper lobe. Images somewhat limited by camera acquisition and body habitus.  Interval Hx/ Subjective:    Remains on 15L HFNC (up from 7L) despite diuresing 10lbs with fluctuant O2sats.  Coox 79% on Milrinone  0.25 mcg/kg/min CVP 7, reading  fluctuant. 2L UOP with IV Lasix . sCr 1.48>1.37>1.32 WBC nl. On vanc for C.diff + E.coli. Repeat BCX- NGTD  Lying in bed. Breathing feels unchanged, feels that the O2 is blasting him in the face. Last BM yesterday. Appetite marginal.   Objective:    Weight Range: (!) 162.8 kg Body mass index is 56.21 kg/m.   Vital Signs:   Temp:  [97.4 F (36.3 C)-97.5 F (36.4 C)] 97.5 F (36.4 C) (06/25 0400) Pulse Rate:  [81-96] 81 (06/25 0600) Resp:  [5-24] 18 (06/25 0600) BP: (104-146)/(53-91) 123/75 (06/25 0600) SpO2:  [87 %-98 %] 91 % (06/25 0600) Weight:  [162.8 kg] 162.8 kg (06/25 0500) Last BM Date : 02/20/24  Weight change: Filed Weights   02/20/24 0355 02/21/24 0400 02/22/24 0500  Weight: (!) 166.9 kg (!) 148 kg (!) 162.8 kg   Intake/Output:  Intake/Output Summary (Last 24 hours) at 02/22/2024 0812 Last data filed at 02/22/2024 0000 Gross per 24 hour  Intake 799.51 ml  Output 1700 ml  Net -900.49 ml    Physical Exam    CVP 7 General: Obese appearing. No distress on HFNC Cardiac: S1 and S2 present. No murmurs or rub. Resp: Lung sounds diminished bases Abdomen: Obese, non-tender, distended.  Extremities: Warm and dry.  No peripheral edema. + blackened extremities (PVD stasis dermatitis) Neuro: Alert and oriented x3. Affect pleasant. Moves all extremities without difficulty. Lines/Devices:  Tunneled RIJ CVL  Telemetry   SR 80-90s (personally reviewed)  EKG    No new EKG to review  Labs    CBC Recent Labs    02/21/24 0409 02/22/24 0326  WBC 5.0 4.7  HGB 12.7* 13.3  HCT 39.9 42.4  MCV 84.7 84.1  PLT 209 261   Basic Metabolic Panel Recent Labs    93/75/74 0409 02/22/24 0326  NA 138 136  K 3.3* 3.8  CL 99 101  CO2 27 26  GLUCOSE 117* 127*  BUN 16 18  CREATININE 1.37* 1.32*  CALCIUM  8.5* 9.1  MG 1.8 2.3  PHOS 2.7 3.3   BNP (last 3 results) Recent Labs    10/24/23 1410 02/17/24 1037 02/21/24 0408  BNP 84.4 424.2* 141.2*   Medications:     Scheduled Medications:  Chlorhexidine  Gluconate Cloth  6 each Topical Daily   cyanocobalamin   1,000 mcg Intramuscular Daily   Followed by   NOREEN ON 02/25/2024] vitamin B-12  1,000 mcg Oral Daily   enoxaparin  (LOVENOX ) injection  150 mg Subcutaneous Q12H   insulin  aspart  0-5 Units Subcutaneous QHS   insulin  aspart  0-9 Units Subcutaneous TID WC   multivitamin with minerals  1 tablet Oral Daily   pantoprazole   40 mg Oral Daily   Riociguat   2.5 mg Oral TID   saccharomyces boulardii  250 mg Oral BID   spironolactone   50 mg Oral Daily   vancomycin   125 mg Oral QID   Vitamin D  (Ergocalciferol )  50,000 Units Oral Q7 days   warfarin  10 mg Oral q1600   Warfarin - Pharmacist Dosing Inpatient   Does not apply q1600   Infusions:  milrinone  0.25 mcg/kg/min (02/22/24 0448)   PRN Medications: acetaminophen  **OR** acetaminophen , albuterol , ondansetron  **OR** ondansetron  (ZOFRAN ) IV, oxyCODONE   Assessment/Plan   Acute on chronic RV Failure 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 Initial LA 2.0>>1.1>>1.3. Milrinone  increased 0.375.  - Echo yesterday with EF >55%, flattened septum in systole/diastole, mod reduced RV - milrinone  dropped to home dose 0.25 mcg/kg/min. Co-ox 77%; continue - Continue Adempas  2.5 mg tid - CVP 7. With RV dysfunction do not want to drop too low. Start PO Torsemide  60 mg daily. - Continue spiro 50 mg daily - INR remains subtherapeutic 2.1 (Goal 2.5-3.5). Continue Lovenox  bridge. PharmD dosing coumadin     2. Recurrent DVT/PE - Chronic, stable PE on CTA  this admit.  - INR subtherapeutic on admission, 1.6  - Coumadin  + Lovenox  bridge per pharmD    3. Atrial fibrillation/flutter - currently in sinus tach, monitor w/ milrinone  - on Coumadin     4. AKI on CKD IIIa - b/l SCr ~1.5-1.7 - SCr  elevated at 2.0 on admit - likely cardiorenal - AKI resolved with diuresis - Cr now below baseline 1.32  5. C-Diff Infection  - GI panel + for Ecoli  - Initial BCx + for Staph Epi (likely contaminant). Repeat BCx NGTD  - on Vanc - on saccharomyces boulardii bid   6. Hypokalemia/Hypomagnesemia   - K 3.8, Mg 2.3 - Give K supp; on spiro 50  7. Ventricular Bigeminy - in setting of low K/Mg + milrinone   - resolved  8. Acute on Chronic Hypoxic Respiratory Failure - Remains on 15L HFNC from 7L 6/23. O2sat variable. Wean O2 as able - CXR 6/23  w/ increasing left perihilar interstitial and patchy opacities, ? Pulm edema vs infection  - BNP 424>141 and PCT negative - Would suspect hypoxia to improved with diuresis, w/o evidence of acute PNA - Check ABG to confirm O2sat - Repeat CXR  - PCCM has seen. Pt felt to be high risk for cardiac arrest if intubation/mechanical ventilation pursued given RV failure and severe PH. PC team following    Length of Stay: 5  Ryan Myra Weng, NP  02/22/2024, 8:12 AM  Advanced Heart Failure Team Pager [phone] (M-F; 7a - 5p)  Please contact [facility] for night-coverage after hours (5p -7a ) and weekends on [email or web address]

## 2024-02-22 NOTE — Consult Note (Signed)
 Pharmacy Consult Note - Anticoagulation  Pharmacy Consult for warfarin Indication: CTEPH and atrial fibrillation  PATIENT MEASUREMENTS: Height: 5' 7 (170.2 cm) Weight: (!) 162.8 kg (358 lb 14.5 oz) IBW/kg (Calculated) : 66.1 HEPARIN  DW (KG): 104.5  VITAL SIGNS: Temp: 97.5 F (36.4 C) (06/25 0400) Temp Source: Oral (06/25 0400) BP: 123/75 (06/25 0600) Pulse Rate: 81 (06/25 0600)  Recent Labs    02/22/24 0326  HGB 13.3  HCT 42.4  PLT 261  LABPROT 24.2*  INR 2.1*  CREATININE 1.32*    Estimated Creatinine Clearance: 94.8 mL/min (A) (by C-G formula based on SCr of 1.32 mg/dL (H)).  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  Regular the workup has not been forthcoming) previously followed by Dr. Darcy at [facility]   Hepatic steatosis    Hypertension    Morbid obesity with BMI of 50.0-59.9, adult (HCC)    Oxygen deficiency    Peripheral vascular disease (HCC)    Presence of IVC filter    Has 2 IVC filters with significant thrombus burden superior to the filter.   Pulmonary embolus (HCC) 12/2016   Initially just treated with anticoagulation, but in February 2023 in April 2024 treated with EKOS thrombectomy for submassive PE.   Ventricular trigeminy    Weakness of both lower limbs     ASSESSMENT: 53 y.o. 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 Patient is on chronic anticoagulation per chart review. Has been on Xarelto  in the past but is currently on  warfarin with INR goal 2.5-3.5. Pharmacy has been consulted to manage warfarin while patient is admitted. INR currently subtherapeutic; no known missed doses however, per wife, medication adherence overall is question. Patient is receiving enoxaparin  until INR is therapeutic.  Pertinent medications: Warfarin 6mg  on Tuesday, Thursday, Sunday Warfarin 5mg  all other days Has 5mg  and 1mg  tablets at home Last dose 6/19 at bedtime Total weekly warfarin = 38mg   Goal(s) of therapy: INR 2.5 - 3.5 Monitor platelets by anticoagulation protocol: Yes   Date:  INR: Dose: 6/20 1.6 8 mg 6/21 2.0 8 mg 6/22 2.2 6 mg  6/23 2.0 8 mg  6/24  1.9 10 mg 6/25 2.1 10 mg   PLAN: Will give warfarin 10 mg po x 1  Continue enoxaparin  1mg /kg q12H until INR is therapeutic x 2  INR and CBC with AM labs  Adriana Bolster, PharmD, BCPS 02/22/2024 7:14 AM

## 2024-02-23 DIAGNOSIS — I2781 Cor pulmonale (chronic): Secondary | ICD-10-CM | POA: Diagnosis not present

## 2024-02-23 LAB — COOXEMETRY PANEL
Carboxyhemoglobin: <0.3 % — ABNORMAL LOW (ref 0.5–1.5)
Carboxyhemoglobin: 1 % (ref 0.5–1.5)
Carboxyhemoglobin: 0.5 % (ref 0.5–1.5)
Methemoglobin: 0 % (ref 0.0–1.5)
Methemoglobin: 0.8 % (ref 0.0–1.5)
Methemoglobin: <0.7 % (ref 0.0–1.5)
O2 Saturation: 77.3 %
O2 Saturation: 62.3 %
O2 Saturation: 63.7 %
Total hemoglobin: 11.5 g/dL — ABNORMAL LOW (ref 12.0–16.0)
Total hemoglobin: 14.3 g/dL (ref 12.0–16.0)
Total hemoglobin: 14.2 g/dL (ref 12.0–16.0)
Total oxygen content: 77.3 %
Total oxygen content: 61.1 %
Total oxygen content: 63.1 %

## 2024-02-23 LAB — CBC
HCT: 41.4 % (ref 39.0–52.0)
Hemoglobin: 13.2 g/dL (ref 13.0–17.0)
MCH: 27.2 pg (ref 26.0–34.0)
MCHC: 31.9 g/dL (ref 30.0–36.0)
MCV: 85.4 fL (ref 80.0–100.0)
Platelets: 271 10*3/uL (ref 150–400)
RBC: 4.85 MIL/uL (ref 4.22–5.81)
RDW: 16.8 % — ABNORMAL HIGH (ref 11.5–15.5)
WBC: 4.3 10*3/uL (ref 4.0–10.5)
nRBC: 0 % (ref 0.0–0.2)

## 2024-02-23 LAB — BASIC METABOLIC PANEL WITH GFR
Anion gap: 10 (ref 5–15)
BUN: 20 mg/dL (ref 6–20)
CO2: 27 mmol/L (ref 22–32)
Calcium: 9 mg/dL (ref 8.9–10.3)
Chloride: 97 mmol/L — ABNORMAL LOW (ref 98–111)
Creatinine, Ser: 1.62 mg/dL — ABNORMAL HIGH (ref 0.61–1.24)
GFR, Estimated: 50 mL/min — ABNORMAL LOW (ref >60)
Glucose, Bld: 154 mg/dL — ABNORMAL HIGH (ref 70–99)
Potassium: 3.5 mmol/L (ref 3.5–5.1)
Sodium: 134 mmol/L — ABNORMAL LOW (ref 135–145)

## 2024-02-23 LAB — GLUCOSE, CAPILLARY
Glucose-Capillary: 133 mg/dL — ABNORMAL HIGH (ref 70–99)
Glucose-Capillary: 125 mg/dL — ABNORMAL HIGH (ref 70–99)
Glucose-Capillary: 131 mg/dL — ABNORMAL HIGH (ref 70–99)
Glucose-Capillary: 128 mg/dL — ABNORMAL HIGH (ref 70–99)

## 2024-02-23 LAB — CULTURE, BLOOD (ROUTINE X 2)
Culture: NO GROWTH
Culture: NO GROWTH
Special Requests: ADEQUATE
Special Requests: ADEQUATE

## 2024-02-23 LAB — PROTIME-INR
INR: 2.5 — ABNORMAL HIGH (ref 0.8–1.2)
Prothrombin Time: 28.2 s — ABNORMAL HIGH (ref 11.4–15.2)

## 2024-02-23 MED ORDER — POTASSIUM CHLORIDE CRYS ER 20 MEQ PO TBCR
40.0000 meq | EXTENDED_RELEASE_TABLET | Freq: Once | ORAL | Status: AC
  Administered 2024-02-23: 40 meq via ORAL
  Filled 2024-02-23: qty 2

## 2024-02-23 MED ORDER — TORSEMIDE 20 MG PO TABS: 60.0000 mg | ORAL_TABLET | Freq: Every day | ORAL | Status: DC

## 2024-02-23 MED ORDER — WARFARIN SODIUM 4 MG PO TABS
8.0000 mg | ORAL_TABLET | Freq: Once | ORAL | Status: AC
  Administered 2024-02-23: 8 mg via ORAL
  Filled 2024-02-23: qty 2

## 2024-02-23 NOTE — Progress Notes (Signed)
 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   EMR reviewed. (extensive family history of clotting disorders. 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 Per chart review, has MTHFR gene mutation and was previously followed by Hematology (Dr. Darcy). There has been some concern for poor compliance with anticoagulation. 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  Had vomiting, unable to eat and 1-2 loose watery stools per day.  Got weaker and weaker until 6/20, he went to drive to his aunts house to meet his milrinone  infusion HHRN but when he got there, he walked in the house and passed out in a chair, so she called 9-1-1.  EMS found that he had a fever, required 10L O2 to keep sats >90%.   In the ER, CXR showed malpositioned CVC but no infiltrates.  CTA chest showed chronic PE, no new findings, no pulmonary edema.   INR subtherapeutic.  Cr up to 2.  Lactate normal.   No fever here.  Given empiric antibiotics, IV Lasix .  Case discussed with advanced HF team Dr. Gardenia who stated they would see the patient.   Wife gave collateral that patient now lives alone, eats only takeout food and she has concerns that his memory is sometimes impaired and she isn't sure he is taking his medicines.   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 daily Continue milrinone  IV infusion Continue Lovenox  and Coumadin , pharmacy consulted for dosing and INR monitoring Cardiology following Discussed with ID, Bld cx most likely contamination 6/21 repeat blood cultures, NGTD Right tunneled catheter/CVC, catheter tip flipped back which was corrected with power flush.  Repeated x-ray for confirmation  IR consulted, 25cm DL tunneled PICC line exchange was done on 6/23 Due to continuous CVP monitoring patient was transferred to stepdown on 6/23  6/23 respiratory failure deteriorated,  CXR: Increased left perihilar interstitial and patchy opacities, which may represent pulmonary edema or infection. Similar cardiomegaly. PCCM consulted, recommended poor prognosis, palliative care consulted.   Acute gastroenteritis secondary to C. difficile and E. Coli C. difficile antigen and PCR toxin positive GI pathogen positive for E. Coli Patient presented with fever, diarrhea and nausea vomiting. DC'd broad-spectrum antibiotics vancomycin  and cefepime  Discussed with ID, recommended to start vancomycin  orally Q6 hourly for 10 days No need to treat E. Coli Started probiotics   # AKI on CKD IIIa Baseline sCr 1.4 sCr 2.0 on admission, sCr 1.96 --1.48--1.62 Bladder scan ruled out urinary retention US  renal: Ruled out hydronephrosis, bladder distention, could not rule out urinary retention, PVR was not done Avoid nephrology medication, your renal dose medication Monitor renal functions and urine output 6/22  RN was advised to do bladder scan every shift   # Metabolic acidosis due to renal failure Bicarb IV 150 mEq given.  Acidosis resolved, bicarbonate is now within normal range. Follow BMP and replete as needed  # Paroxysmal A-fib Currently patient is in  sinus Continue Coumadin  and Lovenox  as above  # HTN, HLD, dHF 6/25 resumed Torsemide  60 mg p.o. daily,  6/24 started Aldactone  50 mg p.o. daily S/p Lasix  as above  # Type 2 diabetes A1c <7% recently.  Diet controlled - SS correction insulin    # Hypokalemia, potassium repleted. Monitor BMP daily  # Hyponatremia Mild - Diuresis as above and trend   # Iron  deficiency, transferrin saturation 5%, started Venofer  3 mg IV daily x 3 doses followed by oral supplement at discharge.  # Vitamin D  deficiency: started vitamin D  50,000 units p.o. weekly, follow with PCP to repeat vitamin D  level after 3 to 6 months.  # Vitamin B12 deficiency: Started vitamin B12 1000 mcg IM injection daily during hospital stay, followed by oral supplement.  Follow-up PCP to repeat vitamin B12 level after 3 to 6 months.  # Obstructive sleep apnea, continue CPAP  # Obesity class III, morbid obesity Body mass index is 53.11 kg/m.  Interventions: Calorie restricted diet and daily exercise advised to lose body weight.  Lifestyle modification discussed.   Diet: Heart healthy/carb modified diet DVT Prophylaxis: Subcutaneous Lovenox  and Coumadin   Advance goals of care discussion: Full code  Family Communication: family was present at bedside, at the time of interview.  The pt provided permission to discuss medical plan with the family. Opportunity was given to ask question and all questions were answered satisfactorily.   Disposition:  Pt is from home, admitted with Resp Failure due to CHF and C. difficile/E. coli gastroenteritis, still has Resp Failure, which precludes a safe discharge. Discharge to ome, when stable, may need few days to improve.  Subjective: No significant events overnight, diarrhea resolved, overall patient feels improvement.  Patient was laying comfortably in the bed, patient was advised to do exercises in the bed what ever he can do.    Physical Exam: General: NAD, lying  comfortably Appear in no distress, affect appropriate Eyes: PERRLA ENT: Oral Mucosa Clear, moist  Neck: no JVD,  Cardiovascular: S1 and S2 Present, no Murmur,  Respiratory: good respiratory effort, Bilateral Air entry equal and Decreased, no Crackles, no wheezes Abdomen: BS present, Soft, obese and no tenderness,  Skin: no rashes Extremities: 4+ edema, with chronic venous stasis pigmentation  Neurologic: without any new focal findings Gait not checked due to patient safety concerns  Vitals:   02/23/24 1000 02/23/24 1100 02/23/24 1200 02/23/24 1300  BP: 100/72 109/79 105/72 (!) 134/42  Pulse: 93 88 85 92  Resp: 18 14 17 20   Temp:      TempSrc:      SpO2: 96% 94% 94% 90%  Weight:      Height:        Intake/Output Summary (Last 24 hours) at 02/23/2024 1544 Last data filed at 02/23/2024 1415 Gross per 24 hour  Intake 600.64 ml  Output 2050 ml  Net -1449.36 ml   Filed Weights   02/21/24 0400 02/22/24 0500 02/23/24 0500  Weight: (!) 148 kg (!) 162.8 kg (!) 153.8 kg    Data Reviewed: I have personally reviewed and interpreted daily labs, tele strips, imagings as discussed above. I reviewed all nursing notes, pharmacy notes, vitals, pertinent old records I have discussed plan of care  as described above with RN and patient/family.  CBC: Recent Labs  Lab 02/17/24 1037 02/18/24 0503 02/19/24 0450 02/20/24 0248 02/21/24 0409 02/22/24 0326 02/23/24 0429  WBC 6.5   < > 4.9 4.8 5.0 4.7 4.3  NEUTROABS 4.9  --   --   --   --   --   --   HGB 13.0   < > 12.2* 12.1* 12.7* 13.3 13.2  HCT 41.4   < > 38.2* 39.0 39.9 42.4 41.4  MCV 85.0   < > 85.3 84.4 84.7 84.1 85.4  PLT 187   < > 151 188 209 261 271   < > = values in this interval not displayed.   Basic Metabolic Panel: Recent Labs  Lab 02/18/24 1345 02/19/24 0450 02/20/24 0248 02/21/24 0409 02/22/24 0326 02/23/24 0429  NA 133* 134* 136 138 136 134*  K 3.3* 3.1* 3.4* 3.3* 3.8 3.5  CL 102 102 104 99 101 97*  CO2 19* 24  24 27 26 27   GLUCOSE 127* 108* 105* 117* 127* 154*  BUN 21* 20 19 16 18 20   CREATININE 1.93* 1.71* 1.48* 1.37* 1.32* 1.62*  CALCIUM  8.0* 8.0* 8.1* 8.5* 9.1 9.0  MG 1.9 1.9 1.9 1.8 2.3  --   PHOS 2.5 3.1 3.0 2.7 3.3  --     Studies: No results found.    Scheduled Meds:  Chlorhexidine  Gluconate Cloth  6 each Topical Daily   cyanocobalamin   1,000 mcg Intramuscular Daily   Followed by   NOREEN ON 02/25/2024] vitamin B-12  1,000 mcg Oral Daily   insulin  aspart  0-5 Units Subcutaneous QHS   insulin  aspart  0-9 Units Subcutaneous TID WC   multivitamin with minerals  1 tablet Oral Daily   pantoprazole   40 mg Oral Daily   Riociguat   2.5 mg Oral TID   saccharomyces boulardii  250 mg Oral BID   spironolactone   50 mg Oral Daily   [START ON 02/24/2024] torsemide   60 mg Oral Daily   vancomycin   125 mg Oral QID   Vitamin D  (Ergocalciferol )  50,000 Units Oral Q7 days   warfarin  8 mg Oral ONCE-1600   Warfarin - Pharmacist Dosing Inpatient   Does not apply q1600   Continuous Infusions:  milrinone  0.25 mcg/kg/min (02/23/24 0801)   PRN Meds: acetaminophen  **OR** acetaminophen , albuterol , ondansetron  **OR** ondansetron  (ZOFRAN ) IV, oxyCODONE   Time spent: 40 minutes  Author: ELVAN SOR. MD [facility] 02/23/2024 3:44 PM  To reach On-call, see care teams to locate the attending and reach out to them via [email or web address]. If 7PM-7AM, please contact night-coverage If you still have difficulty reaching the attending provider, please page the [facility] (Director on Call) for [facility] on amion for assistance.

## 2024-02-23 NOTE — Consult Note (Signed)
 Pharmacy Consult Note - Anticoagulation  Pharmacy Consult for warfarin Indication: CTEPH and atrial fibrillation  PATIENT MEASUREMENTS: Height: 5' 7 (170.2 cm) Weight: (!) 153.8 kg (339 lb 1.1 oz) IBW/kg (Calculated) : 66.1 HEPARIN  DW (KG): 104.5  VITAL SIGNS: Temp: 98.1 F (36.7 C) (06/26 0000) Temp Source: Oral (06/26 0000) BP: 110/76 (06/26 0400) Pulse Rate: 89 (06/26 0400)  Recent Labs    02/23/24 0429  HGB 13.2  HCT 41.4  PLT 271  LABPROT 28.2*  INR 2.5*  CREATININE 1.62*    Estimated Creatinine Clearance: 74.6 mL/min (A) (by C-G formula based on SCr of 1.62 mg/dL (H)).  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  Regular the workup has not been forthcoming) previously followed by Dr. Darcy at [facility]   Hepatic steatosis    Hypertension    Morbid obesity with BMI of 50.0-59.9, adult (HCC)    Oxygen deficiency    Peripheral vascular disease (HCC)    Presence of IVC filter    Has 2 IVC filters with significant thrombus burden superior to the filter.   Pulmonary embolus (HCC) 12/2016   Initially just treated with anticoagulation, but in February 2023 in April 2024 treated with EKOS thrombectomy for submassive PE.   Ventricular trigeminy    Weakness of both lower limbs     ASSESSMENT: 53 y.o. 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 Patient is on chronic anticoagulation per chart review. Has been on Xarelto  in the past but is currently on  warfarin with INR goal 2.5-3.5. Pharmacy has been consulted to manage warfarin while patient is admitted. INR currently subtherapeutic; no known missed doses however, per wife, medication adherence overall is question. Patient is receiving enoxaparin  until INR is therapeutic.  Pertinent medications: Warfarin 6mg  on Tuesday, Thursday, Sunday Warfarin 5mg  all other days Has 5mg  and 1mg  tablets at home Last dose 6/19 at bedtime Total weekly warfarin = 38mg   Goal(s) of therapy: INR 2.5 - 3.5 Monitor platelets by anticoagulation protocol: Yes   Date:  INR: Dose: 6/20 1.6 8 mg 6/21 2.0 8 mg 6/22 2.2 6 mg  6/23 2.0 8 mg  6/24  1.9 10 mg 6/25 2.1 10 mg 6/26 2.5 8 mg   PLAN: Will give warfarin 8 mg po x 1  Will stop enoxaparin  1mg /kg q12H given therapeutic INR  INR and CBC with AM labs  Adriana Bolster, PharmD, BCPS 02/23/2024 7:11 AM

## 2024-02-23 NOTE — Plan of Care (Signed)
  Problem: Education: Goal: Knowledge of General Education information will improve Description: Including pain rating scale, medication(s)/side effects and non-pharmacologic comfort measures Outcome: Progressing   Problem: Nutrition: Goal: Adequate nutrition will be maintained Outcome: Progressing   Problem: Coping: Goal: Level of anxiety will decrease Outcome: Progressing   Problem: Elimination: Goal: Will not experience complications related to bowel motility Outcome: Progressing Goal: Will not experience complications related to urinary retention Outcome: Progressing   Problem: Pain Managment: Goal: General experience of comfort will improve and/or be controlled Outcome: Progressing   Problem: Safety: Goal: Ability to remain free from injury will improve Outcome: Progressing   Problem: Skin Integrity: Goal: Risk for impaired skin integrity will decrease Outcome: Progressing   Problem: Tissue Perfusion: Goal: Adequacy of tissue perfusion will improve Outcome: Progressing

## 2024-02-23 NOTE — TOC Progression Note (Signed)
 Transition of Care [facility]) - Progression Note    Patient Details  Name: Ryan [name] MRN: [id number] Date of Birth: [birth year]  Transition of [facility]) CM/SW Contact  Justus Droke A Saanvi Hakala, RN Phone Number: 02/23/2024, 4:10 PM  Clinical Narrative:    Chart reviewed.  Noted that patient was Acute on Chronic hypoxic  respiratory failure.  Patient is on IV Milrinone  home infusion prior to admission to the hospital. I have given Pam with [employer] clinical update. Patient remains on 15 HFNC. Patient is also being treated for C-diff infection and GI panel positive for Ecoli. Patient is on Vancomycin .    TOC will continue to follow for discharge planning.       Barriers to Discharge: Continued Medical Work up  Expected Discharge Plan and Services   Discharge Planning Services: CM Consult   Living arrangements for the past 2 months: Single Family Home                                       Social Determinants of Health (SDOH) Interventions SDOH Screenings   Food Insecurity: No Food Insecurity (02/17/2024)  Housing: Low Risk  (02/17/2024)  Transportation Needs: Unmet Transportation Needs (02/17/2024)  Utilities: Not At Risk (02/17/2024)  Depression (PHQ2-9): Low Risk  (12/05/2023)  Financial Resource Strain: Low Risk  (08/18/2023)  Physical Activity: Insufficiently Active (08/18/2023)  Social Connections: Moderately Isolated (10/10/2023)  Stress: No Stress Concern Present (08/18/2023)  Tobacco Use: Low Risk  (02/17/2024)    Readmission Risk Interventions    09/23/2023    4:32 PM  Readmission Risk Prevention Plan  Transportation Screening Complete  PCP or Specialist Appt within 3-5 Days Complete  HRI or Home Care Consult Complete  Social Work Consult for Recovery Care Planning/Counseling Complete  Palliative Care Screening Not Applicable  Medication Review Oceanographer) Complete

## 2024-02-23 NOTE — Progress Notes (Addendum)
 Advanced Heart Failure Rounding Note  [facility] Cardiologist: Dr. Zenaida   Chief Complaint:  Hypoxia  Patient Profile  Patient is a chronically ill 53 year old male with extensive family history of clotting disorders. 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 Per chart review, has MTHFR gene mutation and was previously followed by Hematology (Dr. Darcy). There has been some concern for poor compliance with anticoagulation. Hypercoagulable workup has been unrevealing. 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  Multiple hospitalizations in the past several months.  9/24: admitted w/ recurrent PE s/p mechanical thrombectomy  10/24: readmitted w/ a/c respiratory failure. CTA showed persistent nonocclusive thrombus in the right main pulmonary artery as well as new small left upper lobe segmental PE  12/24: Readmitted in 12/24 with worsening dyspnea d/t acute on chronic RV failure. CTA with resolution of prior left-sided PE and stable chronic right-sided PE   Now readmitted w/ a/c hypoxic respiratory failure and a/c RV failure + acute C-diff infection. CTPE (-) for acute PE, chronic PE w/ evidence of CTEPH. Subtherapeutic INR at 1.6.   Pertinent Studies:   RHC + Pulmonary Angiography (Prior to this admission on Milrinone  0.25) 12/16/23:  RA 13, PA 110/36 (60), PCWP 12, CO/CI (TD) 5.15/1.99, TPG 48, PVR 9.3, PAPi 5.7  Selective pulmonary angiography with evidence of disease in the left PA, segmental and subsegmental in anteromedial basal territory, likely segmental disease in the right upper lobe. Images somewhat limited by camera acquisition and body habitus. Interval Hx/ Subjective:    Remains on 15L HFNC (up from 7L) despite diuresing 10lbs with fluctuant O2sats.  Coox 62% on Milrinone  0.25 mcg/kg/min CVP <5. -2.2L UOP with PO  diuretics. Reports eating ice frequently. sCr 1.48>1.37>1.32>1.62  WBC nl. On vanc for C.diff + E.coli. Repeat BCX- NGTD  Breathing unchanged. Unable to get up and move around. Denies CP.   Objective:    Weight Range: (!) 153.8 kg Body mass index is 53.11 kg/m.   Vital Signs:   Temp:  [97.8 F (36.6 C)-98.2 F (36.8 C)] 98.1 F (36.7 C) (06/26 0000) Pulse Rate:  [81-96] 89 (06/26 0400) Resp:  [9-26] 16 (06/26 0400) BP: (97-130)/(54-86) 110/76 (06/26 0400) SpO2:  [89 %-96 %] 95 % (06/26 0400) Weight:  [153.8 kg] 153.8 kg (06/26 0500) Last BM Date : 02/22/24  Weight change: Filed Weights   02/21/24 0400 02/22/24 0500 02/23/24 0500  Weight: (!) 148 kg (!) 162.8 kg (!) 153.8 kg   Intake/Output:  Intake/Output Summary (Last 24 hours) at 02/23/2024 0933 Last data filed at 02/23/2024 0200 Gross per 24 hour  Intake 1134.38 ml  Output 2200 ml  Net -1065.62 ml    Physical Exam    CVP <5 General:  obese appearing.  No respiratory difficulty on HFNC Neck: supple. JVD difficult to see (thick neck). T RIJ CVC Cor: PMI nondisplaced. Regular rate & rhythm. No rubs, gallops or murmurs. Lungs: clear, diminished bases  Extremities: no cyanosis, clubbing, rash, edema. Indurated/dark BLE 2/2 PVD stasis dermatitis Neuro: alert & oriented x 3. Moves all 4 extremities w/o difficulty. Affect pleasant.   Telemetry   SR 90s (personally reviewed)  EKG    No new EKG to review  Labs    CBC Recent Labs    02/22/24 0326 02/23/24 0429  WBC 4.7 4.3  HGB 13.3 13.2  HCT 42.4 41.4  MCV 84.1 85.4  PLT 261 271   Basic Metabolic Panel Recent Labs    93/75/74 0409 02/22/24 0326 02/23/24 0429  NA 138 136 134*  K 3.3* 3.8 3.5  CL 99 101 97*  CO2 27 26 27   GLUCOSE 117* 127* 154*  BUN 16 18 20   CREATININE 1.37* 1.32* 1.62*  CALCIUM  8.5* 9.1 9.0  MG 1.8 2.3  --   PHOS 2.7 3.3  --    BNP (last 3 results) Recent Labs    10/24/23 1410 02/17/24 1037 02/21/24 0408  BNP 84.4  424.2* 141.2*   Medications:    Scheduled Medications:  Chlorhexidine  Gluconate Cloth  6 each Topical Daily   cyanocobalamin   1,000 mcg Intramuscular Daily   Followed by   NOREEN ON 02/25/2024] vitamin B-12  1,000 mcg Oral Daily   insulin  aspart  0-5 Units Subcutaneous QHS   insulin  aspart  0-9 Units Subcutaneous TID WC   multivitamin with minerals  1 tablet Oral Daily   pantoprazole   40 mg Oral Daily   Riociguat   2.5 mg Oral TID   saccharomyces boulardii  250 mg Oral BID   spironolactone   50 mg Oral Daily   [START ON 02/24/2024] torsemide   60 mg Oral Daily   vancomycin   125 mg Oral QID   Vitamin D  (Ergocalciferol )  50,000 Units Oral Q7 days   warfarin  10 mg Oral q1600   Warfarin - Pharmacist Dosing Inpatient   Does not apply q1600   Infusions:  milrinone  0.25 mcg/kg/min (02/23/24 0801)   PRN Medications: acetaminophen  **OR** acetaminophen , albuterol , ondansetron  **OR** ondansetron  (ZOFRAN ) IV, oxyCODONE   Assessment/Plan   Acute on chronic RV Failure 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 Initial LA 2.0>>1.1>>1.3. Milrinone  increased 0.375.  - Echo 6/24: EF >55%, flattened septum in systole/diastole, mod reduced RV - milrinone  dropped to home dose 0.25 mcg/kg/min. Co-ox 62%; continue - Continue Adempas  2.5 mg tid - CVP <5. With RV dysfunction do not want to drop too low. Hold diuretics today.  - Continue spiro 50 mg daily - INR 2.5 (Goal 2.5-3.5). PharmD dosing coumadin     2. Recurrent DVT/PE - Chronic, stable PE on CTA this admit.  - INR 2.5 today  - Coumadin   per pharmD. Can stop lovenox  today.    3. Atrial fibrillation/flutter - currently in NSR 90s, monitor w/ milrinone  - on Coumadin     4. AKI on CKD IIIa - b/l SCr ~1.5-1.7 - SCr  elevated at 2.0 on admit - likely cardiorenal - AKI resolved with diuresis - Cr bumped to 1.62 today. CVP <5. Holding diuretics today  5. C-Diff Infection  - GI panel + for Ecoli  - Initial BCx + for Staph Epi (likely contaminant). Repeat BCx NGTD  - on Vanc - on saccharomyces boulardii bid   6. Hypokalemia/Hypomagnesemia   - K 3.5, Mg 2.3 (yesterday) - Give K supp; on spiro 50  7. Ventricular Bigeminy - in setting of low K/Mg + milrinone   - resolved  8. Acute on Chronic Hypoxic Respiratory Failure - Remains on 15L HFNC from 7L  6/23. O2sat variable. Wean O2 as able - CXR 6/23 w/ increasing left perihilar interstitial and patchy opacities, ? Pulm edema vs infection  - BNP 424>141 and PCT negative - Would suspect hypoxia to improved with diuresis, w/o evidence of acute PNA - PCCM has seen. Pt felt to be high risk for cardiac arrest if intubation/mechanical ventilation pursued given RV failure and severe PH. PC team following    Length of Stay: 6  Beckey LITTIE Coe, NP  02/23/2024, 9:33 AM  Advanced Heart Failure Team Pager [phone] (M-F; 7a - 5p)  Please contact [facility] for night-coverage after hours (5p -7a ) and weekends on [email or web address]  Agree with above.   Remains on milrinone  0.25 (on at home) Co-ox 62% and CVP low but O2 requirements still quite high and having trouble weaning   Says he feels much better. Denies CP or SOB. Diarrhea has resolved  CXR clear  General:  Sitting up in bed on HFNC. No resp difficulty HEENT: normal Neck: supple. no JVD. Carotids 2+ bilat; no bruits. No lymphadenopathy or thryomegaly appreciated. Cor: + PICC Regular rate & rhythm. 2/6 TR Lungs: clear Abdomen: obese soft, nontender, nondistended. No hepatosplenomegaly. No bruits or masses. Good bowel  sounds. Extremities: no cyanosis, clubbing, rash, tr edema Neuro: alert & orientedx3, cranial nerves grossly intact. moves all 4 extremities w/o difficulty. 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 On Adempas  and home milrinone  for RV support. If unable to wean O2 down. Will need to consider placement of PA catheter with initiation of IV prostanoids and transfer to [facility] for ongoing IV therapy for PAH.  CRITICAL CARE Performed by: Cherrie Sieving  Total critical care time: 40 minutes  Critical care time was exclusive of separately billable procedures and treating other patients.  Critical care was necessary to treat or prevent imminent or life-threatening deterioration.  Critical care was time spent personally by me (independent of midlevel providers or residents) on the following activities: development of treatment plan with patient and/or surrogate as well as nursing, discussions with consultants, evaluation of patient's response to treatment, examination of patient, obtaining history from patient or surrogate, ordering and performing treatments and interventions, ordering and review of laboratory studies, ordering and review of radiographic studies, pulse oximetry and re-evaluation of patient's condition.  Sieving Cherrie, MD  6:34 PM

## 2024-02-23 NOTE — Progress Notes (Signed)
 PHARMACY CONSULT NOTE - FOLLOW UP  Pharmacy Consult for Electrolyte Monitoring and Replacement   Recent Labs: Potassium (mmol/L)  Date Value  02/23/2024 3.5  12/28/2014 3.8   Magnesium  (mg/dL)  Date Value  93/74/7974 2.3  12/28/2014 2.7 (H)   Calcium  (mg/dL)  Date Value  93/73/7974 9.0   Calcium , Total (mg/dL)  Date Value  95/69/7983 8.1 (L)   Albumin  (g/dL)  Date Value  93/79/7974 3.5  10/24/2023 3.9  12/23/2014 3.9   Phosphorus (mg/dL)  Date Value  93/74/7974 3.3  12/28/2014 6.1 (H)   Sodium (mmol/L)  Date Value  02/23/2024 134 (L)  10/24/2023 143  12/28/2014 130 (L)     Assessment: 53 y.o. 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 Pharmacy is asked to follow and replace electrolytes while in CCU  Goal of Therapy:  Potassium 4.0 - 5.1 mmol/L Magnesium  2.0 - 2.4 mg/dL All Other Electrolytes WNL   Plan:  ---40 mEq po KCl x 1 ---recheck electrolytes in am  Adriana JONETTA Bolster ,PharmD Clinical Pharmacist 02/23/2024 11:04 AM

## 2024-02-24 ENCOUNTER — Other Ambulatory Visit (HOSPITAL_COMMUNITY): Payer: Self-pay

## 2024-02-24 DIAGNOSIS — I50810 Right heart failure, unspecified: Secondary | ICD-10-CM | POA: Diagnosis not present

## 2024-02-24 DIAGNOSIS — I2781 Cor pulmonale (chronic): Secondary | ICD-10-CM | POA: Diagnosis not present

## 2024-02-24 LAB — BASIC METABOLIC PANEL WITH GFR
Anion gap: 11 (ref 5–15)
BUN: 25 mg/dL — ABNORMAL HIGH (ref 6–20)
CO2: 28 mmol/L (ref 22–32)
Calcium: 9.2 mg/dL (ref 8.9–10.3)
Chloride: 99 mmol/L (ref 98–111)
Creatinine, Ser: 1.69 mg/dL — ABNORMAL HIGH (ref 0.61–1.24)
GFR, Estimated: 48 mL/min — ABNORMAL LOW (ref >60)
Glucose, Bld: 115 mg/dL — ABNORMAL HIGH (ref 70–99)
Potassium: 3.8 mmol/L (ref 3.5–5.1)
Sodium: 138 mmol/L (ref 135–145)

## 2024-02-24 LAB — HEPATIC FUNCTION PANEL
ALT: 54 U/L — ABNORMAL HIGH (ref 0–44)
AST: 71 U/L — ABNORMAL HIGH (ref 15–41)
Albumin: 3.5 g/dL (ref 3.5–5.0)
Alkaline Phosphatase: 45 U/L (ref 38–126)
Bilirubin, Direct: 0.1 mg/dL (ref 0.0–0.2)
Indirect Bilirubin: 0.9 mg/dL (ref 0.3–0.9)
Total Bilirubin: 1 mg/dL (ref 0.0–1.2)
Total Protein: 7.6 g/dL (ref 6.5–8.1)

## 2024-02-24 LAB — CBC
HCT: 42 % (ref 39.0–52.0)
Hemoglobin: 13.2 g/dL (ref 13.0–17.0)
MCH: 27.2 pg (ref 26.0–34.0)
MCHC: 31.4 g/dL (ref 30.0–36.0)
MCV: 86.4 fL (ref 80.0–100.0)
Platelets: 312 10*3/uL (ref 150–400)
RBC: 4.86 MIL/uL (ref 4.22–5.81)
RDW: 17.2 % — ABNORMAL HIGH (ref 11.5–15.5)
WBC: 4.1 10*3/uL (ref 4.0–10.5)
nRBC: 0 % (ref 0.0–0.2)

## 2024-02-24 LAB — COOXEMETRY PANEL
Carboxyhemoglobin: 0.3 % — ABNORMAL LOW (ref 0.5–1.5)
Carboxyhemoglobin: 1.5 % (ref 0.5–1.5)
Methemoglobin: <0.7 % (ref 0.0–1.5)
Methemoglobin: 1.1 % (ref 0.0–1.5)
O2 Saturation: 60.9 %
O2 Saturation: 76.6 %
Total hemoglobin: 13.6 g/dL (ref 12.0–16.0)
Total hemoglobin: 13.9 g/dL (ref 12.0–16.0)
Total oxygen content: 60.8 %
Total oxygen content: 74.6 %

## 2024-02-24 LAB — GLUCOSE, CAPILLARY
Glucose-Capillary: 111 mg/dL — ABNORMAL HIGH (ref 70–99)
Glucose-Capillary: 125 mg/dL — ABNORMAL HIGH (ref 70–99)
Glucose-Capillary: 134 mg/dL — ABNORMAL HIGH (ref 70–99)
Glucose-Capillary: 131 mg/dL — ABNORMAL HIGH (ref 70–99)

## 2024-02-24 LAB — MAGNESIUM: Magnesium: 2.2 mg/dL (ref 1.7–2.4)

## 2024-02-24 LAB — PROTIME-INR
INR: 2.6 — ABNORMAL HIGH (ref 0.8–1.2)
Prothrombin Time: 29.2 s — ABNORMAL HIGH (ref 11.4–15.2)

## 2024-02-24 MED ORDER — MACITENTAN 10 MG PO TABS
10.0000 mg | ORAL_TABLET | Freq: Every day | ORAL | Status: DC
Start: 2024-02-24 — End: 2024-03-01
  Administered 2024-02-24 – 2024-02-29 (×6): 10 mg via ORAL
  Filled 2024-02-24 (×8): qty 1

## 2024-02-24 MED ORDER — TORSEMIDE 20 MG PO TABS
60.0000 mg | ORAL_TABLET | Freq: Every day | ORAL | Status: DC
  Administered 2024-02-24 – 2024-02-26 (×3): 60 mg via ORAL
  Filled 2024-02-24 (×4): qty 3

## 2024-02-24 MED ORDER — WARFARIN SODIUM 4 MG PO TABS
8.0000 mg | ORAL_TABLET | Freq: Once | ORAL | Status: AC
  Administered 2024-02-24: 8 mg via ORAL
  Filled 2024-02-24: qty 2

## 2024-02-24 MED ORDER — TORSEMIDE 20 MG PO TABS: 60.0000 mg | ORAL_TABLET | Freq: Every day | ORAL | Status: DC

## 2024-02-24 MED ORDER — POTASSIUM CHLORIDE CRYS ER 20 MEQ PO TBCR
20.0000 meq | EXTENDED_RELEASE_TABLET | Freq: Once | ORAL | Status: AC
  Administered 2024-02-24: 20 meq via ORAL
  Filled 2024-02-24: qty 1

## 2024-02-24 MED ORDER — POTASSIUM CHLORIDE CRYS ER 20 MEQ PO TBCR: 40.0000 meq | EXTENDED_RELEASE_TABLET | Freq: Once | ORAL | Status: DC

## 2024-02-24 NOTE — Progress Notes (Signed)
 PHARMACY CONSULT NOTE - FOLLOW UP  Pharmacy Consult for Electrolyte Monitoring and Replacement   Recent Labs: Potassium (mmol/L)  Date Value  02/24/2024 3.8  12/28/2014 3.8   Magnesium  (mg/dL)  Date Value  93/72/7974 2.2  12/28/2014 2.7 (H)   Calcium  (mg/dL)  Date Value  93/72/7974 9.2   Calcium , Total (mg/dL)  Date Value  95/69/7983 8.1 (L)   Albumin  (g/dL)  Date Value  93/79/7974 3.5  10/24/2023 3.9  12/23/2014 3.9   Phosphorus (mg/dL)  Date Value  93/74/7974 3.3  12/28/2014 6.1 (H)   Sodium (mmol/L)  Date Value  02/24/2024 138  10/24/2023 143  12/28/2014 130 (L)     Assessment: 53 y.o. 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 Pharmacy is asked to follow and replace electrolytes while in CCU  Pertinent Medications: spironolactone  50 mg po daily, torsemide  60 mg po daily  Goal of Therapy:  Potassium 4.0 - 5.1 mmol/L Magnesium  2.0 - 2.4 mg/dL All Other Electrolytes WNL   Plan:  ---40 mEq po KCl x 1 ---recheck electrolytes in am  Adriana JONETTA Bolster ,PharmD Clinical Pharmacist 02/24/2024 8:32 AM

## 2024-02-24 NOTE — Progress Notes (Signed)
 Advanced Heart Failure Rounding Note  [facility] Cardiologist: Dr. Zenaida   Chief Complaint:  Hypoxia  Patient Profile  Patient is a chronically ill 53 year old male with extensive family history of clotting disorders. 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 Per chart review, has MTHFR gene mutation and was previously followed by Hematology (Dr. Darcy). There has been some concern for poor compliance with anticoagulation. Hypercoagulable workup has been unrevealing. 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  Multiple hospitalizations in the past several months.  9/24: admitted w/ recurrent PE s/p mechanical thrombectomy  10/24: readmitted w/ a/c respiratory failure. CTA showed persistent nonocclusive thrombus in the right main pulmonary artery as well as new small left upper lobe segmental PE  12/24: Readmitted in 12/24 with worsening dyspnea d/t acute on chronic RV failure. CTA with resolution of prior left-sided PE and stable chronic right-sided PE   Now readmitted w/ a/c hypoxic respiratory failure and a/c RV failure + acute C-diff infection. CTPE (-) for acute PE, chronic PE w/ evidence of CTEPH. Subtherapeutic INR at 1.6.   Pertinent Studies:   RHC + Pulmonary Angiography (Prior to this admission on Milrinone  0.25) 12/16/23:  RA 13, PA 110/36 (60), PCWP 12, CO/CI (TD) 5.15/1.99, TPG 48, PVR 9.3, PAPi 5.7  Selective pulmonary angiography with evidence of disease in the left PA, segmental and subsegmental in anteromedial basal territory, likely segmental disease in the right upper lobe. Images somewhat limited by camera acquisition and body habitus. Interval Hx/ Subjective:    Remains on HFNC. Down from, 15L -> 10l. Denies CP or SOB. PICC clotted so unable to get CVP checked  Remains on home milrinone   0.25  Objective:    Weight Range: (!) 150.9 kg Body mass index is 52.1 kg/m.   Vital Signs:   Temp:  [97.3 F (36.3 C)-98.3 F (36.8 C)] 98 F (36.7 C) (06/27 0100) Pulse Rate:  [41-100] 89 (06/27 1100) Resp:  [0-56] 20 (06/27 1100) BP: (95-135)/(42-88) 116/63 (06/27 1000) SpO2:  [88 %-100 %] 95 % (06/27 1100) Weight:  [150.9 kg] 150.9 kg (06/27 0717) Last BM Date : 02/23/24  Weight change: Filed Weights   02/22/24 0500 02/23/24 0500 02/24/24 0717  Weight: (!) 162.8 kg (!) 153.8 kg (!) 150.9 kg   Intake/Output:  Intake/Output Summary (Last 24 hours) at 02/24/2024 1142 Last data filed at 02/24/2024 0926 Gross per 24 hour  Intake 595.87 ml  Output 1000 ml  Net -404.13 ml    Physical Exam    General:  Sitting up in bed on HFNC No resp difficulty HEENT: normal Neck: supple. no JVD. Carotids 2+ bilat; no bruits. No lymphadenopathy or thryomegaly appreciated. Cor: PMI nondisplaced. Regular rate & rhythm. No rubs, gallops or murmurs. Lungs: clear Abdomen: soft, nontender, nondistended. No hepatosplenomegaly. No bruits or masses. Good bowel sounds. Extremities: no cyanosis, clubbing, rash, 1+ chronic lymphedema Neuro: alert & orientedx3, cranial nerves grossly intact. moves all 4 extremities w/o difficulty. Affect pleasant  Telemetry    Sinus 80-90s Personally reviewed  Labs    CBC Recent Labs    02/23/24 0429 02/24/24 0510  WBC 4.3 4.1  HGB 13.2 13.2  HCT 41.4 42.0  MCV 85.4 86.4  PLT 271 312   Basic Metabolic Panel Recent Labs    93/74/74 0326 02/23/24 0429 02/24/24 0510  NA 136 134* 138  K 3.8 3.5 3.8  CL 101 97* 99  CO2 26 27 28   GLUCOSE 127* 154* 115*  BUN 18 20 25*  CREATININE 1.32* 1.62* 1.69*  CALCIUM  9.1 9.0 9.2  MG 2.3  --  2.2  PHOS 3.3  --   --    BNP (last 3 results) Recent Labs    10/24/23 1410 02/17/24 1037 02/21/24 0408  BNP 84.4 424.2* 141.2*   Medications:    Scheduled Medications:  Chlorhexidine  Gluconate Cloth  6 each  Topical Daily   [START ON 02/25/2024] vitamin B-12  1,000 mcg Oral Daily   insulin  aspart  0-5 Units Subcutaneous QHS   insulin  aspart  0-9 Units Subcutaneous TID WC   multivitamin with minerals  1 tablet Oral Daily   pantoprazole   40 mg Oral Daily   Riociguat   2.5 mg Oral TID   saccharomyces boulardii  250 mg Oral BID   spironolactone   50 mg Oral Daily   torsemide   60 mg Oral Daily   vancomycin   125 mg Oral QID   Vitamin D  (Ergocalciferol )  50,000 Units Oral Q7 days   Warfarin - Pharmacist Dosing Inpatient   Does not apply q1600   Infusions:  milrinone  0.25 mcg/kg/min (02/24/24 1048)   PRN Medications: acetaminophen  **OR** acetaminophen , albuterol , ondansetron  **OR** ondansetron  (ZOFRAN ) IV, oxyCODONE   Assessment/Plan   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 Initial LA 2.0>>1.1>>1.3. Milrinone  increased 0.375.  - Echo 6/24: EF >55%, flattened septum in systole/diastole, mod reduced RV - milrinone  dropped to home dose 0.25 mcg/kg/min. Co-ox stable at 61%  - Continue Adempas  2.5 mg tid - Continue spiro 50 mg daily - Cotninue torsemide  - INR 2.2 (Goal 2.5-3.5). PharmD dosing coumadin   - Patient has 2 IVC filters that are clotted with no IVC return, has been evaluated at [facility] for removal and felt not to be a candidate. 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 Has been On Adempas  and home milrinone  for RV support. If unable to wean O2 down.  - Will add macitentan 10 to see if this can help liberate from HFNC. Need  to watch for worsening edema.  - If unable to wean from HFNC. Will need to consider placement of PA catheter with initiation of IV prostanoids and transfer to [facility] for ongoing IV therapy for PAH.  2. Recurrent DVT/PE - Chronic, stable PE on CTA this admit.  - INR 2.2 today  - Discussed warfarin dosing with PharmD personally.   3. Atrial fibrillation/flutter - currently in NSR 90s, monitor w/ milrinone  - on Coumadin     4. AKI on CKD IIIa - b/l SCr ~1.5-1.7 - SCr  elevated at 2.0 on admit - likely cardiorenal - AKI resolved with diuresis - Scr 1.69 today stable  5. C-Diff Infection  - GI panel + for Ecoli  - Initial BCx + for Staph Epi (likely contaminant). Repeat BCx NGTD  - on Vanc - on saccharomyces boulardii bid   6. Hypokalemia/Hypomagnesemia   - K 3.8, Mg 2.3 (yesterday) -  Supp as needed  7. Ventricular Bigeminy - in setting of low K/Mg + milrinone   - resolved  8. Acute on Chronic Hypoxic Respiratory Failure - Remains on 15L HFNC from 7L 6/23. O2sat variable. Wean O2 as able - CXR 6/23 w/ increasing left perihilar interstitial and patchy opacities, ? Pulm edema vs infection  - BNP 424>141 and PCT negative - Would suspect hypoxia to improved with diuresis, w/o evidence of acute PNA - PCCM has seen. Pt felt to be high risk for cardiac arrest if intubation/mechanical ventilation pursued given RV failure and severe PH. PC team following   - Plan as above. D/w Dr. Digayli at bedside  [facility] team will see again Monday.   CRITICAL CARE Performed by: Cherrie Sieving  Total critical care time: 38 minutes  Critical care time was exclusive of separately billable procedures and treating other patients.  Critical care was necessary to treat or prevent imminent or life-threatening deterioration.  Critical care was time spent personally by me (independent of midlevel providers or residents) on the following activities: development of treatment plan with patient and/or  surrogate as well as nursing, discussions with consultants, evaluation of patient's response to treatment, examination of patient, obtaining history from patient or surrogate, ordering and performing treatments and interventions, ordering and review of laboratory studies, ordering and review of radiographic studies, pulse oximetry and re-evaluation of patient's condition.   Length of Stay: 7  Sieving Cherrie, MD  02/24/2024, 11:42 AM  Advanced Heart Failure Team Pager [phone] (M-F; 7a - 5p)  Please contact [facility] for night-coverage after hours (5p -7a ) and weekends on [email or web address]

## 2024-02-24 NOTE — Evaluation (Signed)
 Occupational Therapy Evaluation Patient Details Name: Ryan [name] MRN: [id number] DOB: [birth year] Today's Date: 02/24/2024   History of Present Illness   53 y/o male presented to ED on 02/17/24 for worsening SOB and cough along with syncope. Admitted for suspected cor pulmonale and pulmonary HTN. PMH: CTEPH and pHTN on milrinone , dCHF, chronic respiratory failure on 2L, HTN, DM, CKD IIIa     Clinical Impressions Chart reviewed to date, pt greeted in bed, alert and oriented x4. He reports he has been in bed since he got here- limited attempts at out of bed. PTA Pt reports he is generally MOD I-I with ADL/IADL, amb with no AD household distances. Pt presents with deficits in activity tolerance, balance, strength, affecting safe and optimal ADL completion. Supervision required for bed mobility, STS with CGA+2. Pt reports his legs feel very weak, BP stable. Pt tolerates static standing for approx 2 minutes and lateral steps up the bed to the R with CGA+2. MAX A required for LB dressing. Pt is performing ADL/functional mobility below PLOF, will continue to benefit from acute OT to address functional deficits and to facilitate optimal ADL performance. Spo2 >90% on 12L via HFNC throughout.     If plan is discharge home, recommend the following:   A lot of help with walking and/or transfers;A lot of help with bathing/dressing/bathroom;Help with stairs or ramp for entrance;Assistance with cooking/housework     Functional Status Assessment   Patient has had a recent decline in their functional status and demonstrates the ability to make significant improvements in function in a reasonable and predictable amount of time.     Equipment Recommendations   BSC/3in1;Tub/shower seat     Recommendations for Other Services         Precautions/Restrictions   Precautions Precautions: Fall Recall of Precautions/Restrictions: Intact Restrictions Weight Bearing Restrictions Per Provider  Order: No     Mobility Bed Mobility Overal bed mobility: Needs Assistance Bed Mobility: Supine to Sit, Sit to Supine     Supine to sit: Supervision Sit to supine: Supervision        Transfers Overall transfer level: Needs assistance Equipment used: None Transfers: Sit to/from Stand Sit to Stand: Contact guard assist, +2 safety/equipment           General transfer comment: multiple attempts, increased time; pt takes approx 3-4 lateral steps up the bed with CGA +2, static standing for approx 2 minutes;      Balance Overall balance assessment: Needs assistance Sitting-balance support: No upper extremity supported, Feet supported Sitting balance-Leahy Scale: Fair     Standing balance support: No upper extremity supported, During functional activity Standing balance-Leahy Scale: Fair                             ADL either performed or assessed with clinical judgement   ADL Overall ADL's : Needs assistance/impaired Eating/Feeding: Set up;Sitting   Grooming: Set up;Sitting           Upper Body Dressing : Minimal assistance;Sitting   Lower Body Dressing: Maximal assistance;Sitting/lateral leans Lower Body Dressing Details (indicate cue type and reason): anticipate     Toileting- Clothing Manipulation and Hygiene: Maximal assistance               Vision Patient Visual Report: No change from baseline       Perception         Praxis         Pertinent  Vitals/Pain Pain Assessment Pain Assessment: No/denies pain     Extremity/Trunk Assessment Upper Extremity Assessment Upper Extremity Assessment: Generalized weakness   Lower Extremity Assessment Lower Extremity Assessment: Generalized weakness   Cervical / Trunk Assessment Cervical / Trunk Assessment: Normal   Communication Communication Communication: No apparent difficulties   Cognition Arousal: Alert Behavior During Therapy: WFL for tasks assessed/performed Cognition: No  apparent impairments                               Following commands: Intact       Cueing  General Comments      spo2 >90% on 12 L via HFNC throughout   Exercises Other Exercises Other Exercises: edu re: role of OT, role of rehab, discharge recommendations, safe ADL completion with compensatory techniques/AD   Shoulder Instructions      Home Living Family/patient expects to be discharged to:: Private residence Living Arrangements: Children Available Help at Discharge: Family;Available PRN/intermittently Type of Home: House Home Access: Level entry     Home Layout: Two level;Able to live on main level with bedroom/bathroom (has a bonus room where your office is)   Alternate Level Stairs-Rails: Left;Right;Can reach both Bathroom Shower/Tub: Producer, television/film/video: Standard Bathroom Accessibility: Yes   Home Equipment: Shower seat - built in          Prior Functioning/Environment Prior Level of Function : Independent/Modified Independent;Driving;Working/employed             Mobility Comments: ind with no AD ADLs Comments: currently on disability;    OT Problem List: Decreased strength;Decreased activity tolerance;Decreased knowledge of use of DME or AE;Cardiopulmonary status limiting activity;Decreased safety awareness;Impaired balance (sitting and/or standing)   OT Treatment/Interventions: Self-care/ADL training;DME and/or AE instruction;Therapeutic activities;Balance training;Therapeutic exercise;Energy conservation;Patient/family education      OT Goals(Current goals can be found in the care plan section)   Acute Rehab OT Goals Patient Stated Goal: return to PLOF OT Goal Formulation: With patient Time For Goal Achievement: 03/09/24 Potential to Achieve Goals: Good ADL Goals Pt Will Perform Grooming: with modified independence;sitting;standing Pt Will Perform Lower Body Dressing: with modified independence;sitting/lateral  leans;sit to/from stand Pt Will Transfer to Toilet: with modified independence;ambulating Pt Will Perform Toileting - Clothing Manipulation and hygiene: with modified independence;sit to/from stand;sitting/lateral leans   OT Frequency:  Min 3X/week    Co-evaluation PT/OT/SLP Co-Evaluation/Treatment: Yes Reason for Co-Treatment: To address functional/ADL transfers;For patient/therapist safety PT goals addressed during session: Mobility/safety with mobility OT goals addressed during session: ADL's and self-care      AM-PAC OT 6 Clicks Daily Activity     Outcome Measure Help from another person eating meals?: None Help from another person taking care of personal grooming?: None Help from another person toileting, which includes using toliet, bedpan, or urinal?: A Lot Help from another person bathing (including washing, rinsing, drying)?: A Lot Help from another person to put on and taking off regular upper body clothing?: A Little Help from another person to put on and taking off regular lower body clothing?: A Lot 6 Click Score: 17   End of Session Equipment Utilized During Treatment: Oxygen Nurse Communication: Mobility status  Activity Tolerance: Patient tolerated treatment well Patient left: in bed;with call bell/phone within reach (in modified chair position)  OT Visit Diagnosis: Other abnormalities of gait and mobility (R26.89);Muscle weakness (generalized) (M62.81)                Time:  8945-8884 OT Time Calculation (min): 21 min Charges:  OT General Charges $OT Visit: 1 Visit OT Evaluation $OT Eval High Complexity: 1 High  Therisa Sheffield, OTD OTR/L  02/24/24, 1:33 PM

## 2024-02-24 NOTE — Progress Notes (Signed)
 Monitoring by Pharmacy for Pulmonary Hypertension Treatment  Indication - Initiation of PAH medication while hospitalized  Patient is 53 y.o. and will be newly started on macitentan (OPSUMIT).  No pregnancy risk.  Hepatic function has been evaluated. AST / ALT appropriate to continue medication at this time.     Latest Ref Rng & Units 02/17/2024   10:37 AM 10/24/2023    2:10 PM 10/16/2023    2:00 PM  Hepatic Function  Total Protein 6.5 - 8.1 g/dL 8.0   7.1   Albumin  3.5 - 5.0 g/dL 3.5  3.9  3.5   AST 15 - 41 U/L 27   25   ALT 0 - 44 U/L 16   23   Alk Phosphatase 38 - 126 U/L 45   52   Total Bilirubin 0.0 - 1.2 mg/dL 1.1   1.2    Thank for you allowing us  to participate in the care of this patient.  Jaun GORMAN Bash 02/24/2024, 2:15 PM Clinical Pharmacist

## 2024-02-24 NOTE — H&P (View-Only) (Signed)
 Advanced Heart Failure Rounding Note  AHF Cardiologist: Dr. Zenaida   Chief Complaint:  Hypoxia  Patient Profile  Patient is a chronically ill 53 year old male with extensive family history of clotting disorders. 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 Per chart review, has MTHFR gene mutation and was previously followed by Hematology (Dr. Darcy). There has been some concern for poor compliance with anticoagulation. Hypercoagulable workup has been unrevealing. 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  Multiple hospitalizations in the past several months.  9/24: admitted w/ recurrent PE s/p mechanical thrombectomy  10/24: readmitted w/ a/c respiratory failure. CTA showed persistent nonocclusive thrombus in the right main pulmonary artery as well as new small left upper lobe segmental PE  12/24: Readmitted in 12/24 with worsening dyspnea d/t acute on chronic RV failure. CTA with resolution of prior left-sided PE and stable chronic right-sided PE   Now readmitted w/ a/c hypoxic respiratory failure and a/c RV failure + acute C-diff infection. CTPE (-) for acute PE, chronic PE w/ evidence of CTEPH. Subtherapeutic INR at 1.6.   Pertinent Studies:   RHC + Pulmonary Angiography (Prior to this admission on Milrinone  0.25) 12/16/23:  RA 13, PA 110/36 (60), PCWP 12, CO/CI (TD) 5.15/1.99, TPG 48, PVR 9.3, PAPi 5.7  Selective pulmonary angiography with evidence of disease in the left PA, segmental and subsegmental in anteromedial basal territory, likely segmental disease in the right upper lobe. Images somewhat limited by camera acquisition and body habitus. Interval Hx/ Subjective:    Remains on HFNC. Down from, 15L -> 10l. Denies CP or SOB. PICC clotted so unable to get CVP checked  Remains on home milrinone   0.25  Objective:    Weight Range: (!) 150.9 kg Body mass index is 52.1 kg/m.   Vital Signs:   Temp:  [97.3 F (36.3 C)-98.3 F (36.8 C)] 98 F (36.7 C) (06/27 0100) Pulse Rate:  [41-100] 89 (06/27 1100) Resp:  [0-56] 20 (06/27 1100) BP: (95-135)/(42-88) 116/63 (06/27 1000) SpO2:  [88 %-100 %] 95 % (06/27 1100) Weight:  [150.9 kg] 150.9 kg (06/27 0717) Last BM Date : 02/23/24  Weight change: Filed Weights   02/22/24 0500 02/23/24 0500 02/24/24 0717  Weight: (!) 162.8 kg (!) 153.8 kg (!) 150.9 kg   Intake/Output:  Intake/Output Summary (Last 24 hours) at 02/24/2024 1142 Last data filed at 02/24/2024 0926 Gross per 24 hour  Intake 595.87 ml  Output 1000 ml  Net -404.13 ml    Physical Exam    General:  Sitting up in bed on HFNC No resp difficulty HEENT: normal Neck: supple. no JVD. Carotids 2+ bilat; no bruits. No lymphadenopathy or thryomegaly appreciated. Cor: PMI nondisplaced. Regular rate & rhythm. No rubs, gallops or murmurs. Lungs: clear Abdomen: soft, nontender, nondistended. No hepatosplenomegaly. No bruits or masses. Good bowel sounds. Extremities: no cyanosis, clubbing, rash, 1+ chronic lymphedema Neuro: alert & orientedx3, cranial nerves grossly intact. moves all 4 extremities w/o difficulty. Affect pleasant  Telemetry    Sinus 80-90s Personally reviewed  Labs    CBC Recent Labs    02/23/24 0429 02/24/24 0510  WBC 4.3 4.1  HGB 13.2 13.2  HCT 41.4 42.0  MCV 85.4 86.4  PLT 271 312   Basic Metabolic Panel Recent Labs    93/74/74 0326 02/23/24 0429 02/24/24 0510  NA 136 134* 138  K 3.8 3.5 3.8  CL 101 97* 99  CO2 26 27 28   GLUCOSE 127* 154* 115*  BUN 18 20 25*  CREATININE 1.32* 1.62* 1.69*  CALCIUM  9.1 9.0 9.2  MG 2.3  --  2.2  PHOS 3.3  --   --    BNP (last 3 results) Recent Labs    10/24/23 1410 02/17/24 1037 02/21/24 0408  BNP 84.4 424.2* 141.2*   Medications:    Scheduled Medications:  Chlorhexidine  Gluconate Cloth  6 each  Topical Daily   [START ON 02/25/2024] vitamin B-12  1,000 mcg Oral Daily   insulin  aspart  0-5 Units Subcutaneous QHS   insulin  aspart  0-9 Units Subcutaneous TID WC   multivitamin with minerals  1 tablet Oral Daily   pantoprazole   40 mg Oral Daily   Riociguat   2.5 mg Oral TID   saccharomyces boulardii  250 mg Oral BID   spironolactone   50 mg Oral Daily   torsemide   60 mg Oral Daily   vancomycin   125 mg Oral QID   Vitamin D  (Ergocalciferol )  50,000 Units Oral Q7 days   Warfarin - Pharmacist Dosing Inpatient   Does not apply q1600   Infusions:  milrinone  0.25 mcg/kg/min (02/24/24 1048)   PRN Medications: acetaminophen  **OR** acetaminophen , albuterol , ondansetron  **OR** ondansetron  (ZOFRAN ) IV, oxyCODONE   Assessment/Plan   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 Initial LA 2.0>>1.1>>1.3. Milrinone  increased 0.375.  - Echo 6/24: EF >55%, flattened septum in systole/diastole, mod reduced RV - milrinone  dropped to home dose 0.25 mcg/kg/min. Co-ox stable at 61%  - Continue Adempas  2.5 mg tid - Continue spiro 50 mg daily - Cotninue torsemide  - INR 2.2 (Goal 2.5-3.5). PharmD dosing coumadin   - Patient has 2 IVC filters that are clotted with no IVC return, has been evaluated at [facility] for removal and felt not to be a candidate. 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 Has been On Adempas  and home milrinone  for RV support. If unable to wean O2 down.  - Will add macitentan 10 to see if this can help liberate from HFNC. Need  to watch for worsening edema.  - If unable to wean from HFNC. Will need to consider placement of PA catheter with initiation of IV prostanoids and transfer to [facility] for ongoing IV therapy for PAH.  2. Recurrent DVT/PE - Chronic, stable PE on CTA this admit.  - INR 2.2 today  - Discussed warfarin dosing with PharmD personally.   3. Atrial fibrillation/flutter - currently in NSR 90s, monitor w/ milrinone  - on Coumadin     4. AKI on CKD IIIa - b/l SCr ~1.5-1.7 - SCr  elevated at 2.0 on admit - likely cardiorenal - AKI resolved with diuresis - Scr 1.69 today stable  5. C-Diff Infection  - GI panel + for Ecoli  - Initial BCx + for Staph Epi (likely contaminant). Repeat BCx NGTD  - on Vanc - on saccharomyces boulardii bid   6. Hypokalemia/Hypomagnesemia   - K 3.8, Mg 2.3 (yesterday) -  Supp as needed  7. Ventricular Bigeminy - in setting of low K/Mg + milrinone   - resolved  8. Acute on Chronic Hypoxic Respiratory Failure - Remains on 15L HFNC from 7L 6/23. O2sat variable. Wean O2 as able - CXR 6/23 w/ increasing left perihilar interstitial and patchy opacities, ? Pulm edema vs infection  - BNP 424>141 and PCT negative - Would suspect hypoxia to improved with diuresis, w/o evidence of acute PNA - PCCM has seen. Pt felt to be high risk for cardiac arrest if intubation/mechanical ventilation pursued given RV failure and severe PH. PC team following   - Plan as above. D/w Dr. Digayli at bedside  AHF team will see again Monday.   CRITICAL CARE Performed by: Cherrie Sieving  Total critical care time: 38 minutes  Critical care time was exclusive of separately billable procedures and treating other patients.  Critical care was necessary to treat or prevent imminent or life-threatening deterioration.  Critical care was time spent personally by me (independent of midlevel providers or residents) on the following activities: development of treatment plan with patient and/or  surrogate as well as nursing, discussions with consultants, evaluation of patient's response to treatment, examination of patient, obtaining history from patient or surrogate, ordering and performing treatments and interventions, ordering and review of laboratory studies, ordering and review of radiographic studies, pulse oximetry and re-evaluation of patient's condition.   Length of Stay: 7  Sieving Cherrie, MD  02/24/2024, 11:42 AM  Advanced Heart Failure Team Pager [phone] (M-F; 7a - 5p)  Please contact [facility] for night-coverage after hours (5p -7a ) and weekends on [email or web address]

## 2024-02-24 NOTE — Progress Notes (Signed)
 ID sign off FU BCX neg. No fevers. Wbc nml  No evidence recurent bacteremia or line infection  WIll sign off. Please call if new issues

## 2024-02-24 NOTE — Plan of Care (Signed)
     Referral previously received for Ryan DELENA Ruth for goals of care discussion. Noted most recent palliative in-person assessment dated 02/20/2024.  And offer for recommended to follow from a distance/chart check.  At last palliative visit goals were for full code and full scope, declined HCPOA/living well documentation completion.  Chart reviewed for Recent provider notes, nurse notes, TOC notes, vitals, labs, and imaging and updates received from RN.   At this time patient appears critically ill but stable, advanced heart failure considering transfer to [facility] for ongoing IV therapy for PAH if unable to wean oxygen.   No plan for in person follow-up today.  We will continue to shadow for significant clinical change necessitating palliative medicine reintervention.  Please contact the palliative medicine provider on service for any new/urgent needs that require our assistance with this patient.  Thank you for your referral and allowing PMT to assist in Ryan [name]'s care.   Camellia Kays, NP Palliative Medicine Team Phone: [phone]  NO CHARGE

## 2024-02-24 NOTE — Evaluation (Signed)
 Physical Therapy Evaluation Patient Details Name: Ryan [name] MRN: [id number] DOB: April 19, 1971 Today's Date: 02/24/2024  History of Present Illness  53 y/o male presented to ED on 02/17/24 for worsening SOB and cough along with syncope. Admitted for suspected cor pulmonale and pulmonary HTN. PMH: CTEPH and pHTN on milrinone , dCHF, chronic respiratory failure on 2L, HTN, DM, CKD IIIa  Clinical Impression  Patient admitted with the above. PTA, patient lives with daughter and was independent with no AD and on 2L O2 at baseline. On 12L O2 throughout session, spO2 >90%. Required supervision for bed mobility and stood with CGA with no AD. Took sidesteps towards [facility] with CGA and no AD. Instructed patient on bed level BLE strengthening exercises with good return demonstration. Patient will benefit from skilled PT services during acute stay to address listed deficits. Patient will benefit from ongoing therapy at discharge to maximize functional independence and safety.       If plan is discharge home, recommend the following: A lot of help with walking and/or transfers;A little help with bathing/dressing/bathroom;Assistance with cooking/housework;Assist for transportation;Help with stairs or ramp for entrance   Can travel by private vehicle        Equipment Recommendations Rolling Pauline Trainer (2 wheels) (bariatric)  Recommendations for Other Services  Rehab consult    Functional Status Assessment Patient has had a recent decline in their functional status and demonstrates the ability to make significant improvements in function in a reasonable and predictable amount of time.     Precautions / Restrictions Precautions Precautions: Fall Recall of Precautions/Restrictions: Intact Restrictions Weight Bearing Restrictions Per Provider Order: No      Mobility  Bed Mobility Overal bed mobility: Needs Assistance Bed Mobility: Supine to Sit, Sit to Supine     Supine to sit: Supervision Sit to  supine: Supervision        Transfers Overall transfer level: Needs assistance Equipment used: None Transfers: Sit to/from Stand Sit to Stand: Contact guard assist, +2 safety/equipment                Ambulation/Gait Ambulation/Gait assistance: Contact guard assist Gait Distance (Feet): 3 Feet Assistive device: None Gait Pattern/deviations: Step-to pattern Gait velocity: decreased     General Gait Details: sidesteps towards HOB. On 12L O2, spO2 maintaining >90%  Stairs            Wheelchair Mobility     Tilt Bed    Modified Rankin (Stroke Patients Only)       Balance Overall balance assessment: Needs assistance Sitting-balance support: No upper extremity supported, Feet supported Sitting balance-Leahy Scale: Fair     Standing balance support: No upper extremity supported, During functional activity Standing balance-Leahy Scale: Fair                               Pertinent Vitals/Pain Pain Assessment Pain Assessment: No/denies pain    Home Living Family/patient expects to be discharged to:: Private residence Living Arrangements: Children Available Help at Discharge: Family;Available PRN/intermittently Type of Home: House Home Access: Level entry       Home Layout: Two level;Able to live on main level with bedroom/bathroom (has a bonus room where your office is) Home Equipment: Shower seat - built in      Prior Function Prior Level of Function : Independent/Modified Independent;Driving;Working/employed             Mobility Comments: ind with no AD ADLs Comments: currently on disability;  Extremity/Trunk Assessment   Upper Extremity Assessment Upper Extremity Assessment: Defer to OT evaluation    Lower Extremity Assessment Lower Extremity Assessment: Generalized weakness    Cervical / Trunk Assessment Cervical / Trunk Assessment: Normal  Communication   Communication Communication: No apparent difficulties     Cognition Arousal: Alert Behavior During Therapy: WFL for tasks assessed/performed   PT - Cognitive impairments: No apparent impairments                         Following commands: Intact       Cueing       General Comments General comments (skin integrity, edema, etc.): On 12L O2 HFNC, spO2 >90% throughout    Exercises Other Exercises Other Exercises: Educated on LAQ, heel slides, and hip abduction for BLE strengthening   Assessment/Plan    PT Assessment Patient needs continued PT services  PT Problem List Decreased strength;Decreased activity tolerance;Decreased mobility;Decreased balance;Decreased safety awareness;Decreased knowledge of use of DME;Decreased knowledge of precautions       PT Treatment Interventions DME instruction;Gait training;Functional mobility training;Therapeutic activities;Therapeutic exercise;Balance training;Neuromuscular re-education;Patient/family education    PT Goals (Current goals can be found in the Care Plan section)  Acute Rehab PT Goals Patient Stated Goal: to get better PT Goal Formulation: With patient Time For Goal Achievement: 03/09/24 Potential to Achieve Goals: Good    Frequency Min 3X/week     Co-evaluation PT/OT/SLP Co-Evaluation/Treatment: Yes Reason for Co-Treatment: To address functional/ADL transfers;For patient/therapist safety PT goals addressed during session: Mobility/safety with mobility         AM-PAC PT 6 Clicks Mobility  Outcome Measure Help needed turning from your back to your side while in a flat bed without using bedrails?: A Little Help needed moving from lying on your back to sitting on the side of a flat bed without using bedrails?: A Little Help needed moving to and from a bed to a chair (including a wheelchair)?: A Little Help needed standing up from a chair using your arms (e.g., wheelchair or bedside chair)?: A Little Help needed to walk in hospital room?: A Little Help needed  climbing 3-5 steps with a railing? : A Little 6 Click Score: 18    End of Session Equipment Utilized During Treatment: Oxygen Activity Tolerance: Patient tolerated treatment well Patient left: in bed;with call bell/phone within reach Nurse Communication: Mobility status PT Visit Diagnosis: Unsteadiness on feet (R26.81);Muscle weakness (generalized) (M62.81);Other abnormalities of gait and mobility (R26.89)    Time: 8945-8884 PT Time Calculation (min) (ACUTE ONLY): 21 min   Charges:   PT Evaluation $PT Eval Moderate Complexity: 1 Mod   PT General Charges $$ ACUTE PT VISIT: 1 Visit         Maryanne Finder, PT, DPT Physical Therapist - [facility]  [facility]   Deyanna Mctier A Aaban Griep 02/24/2024, 1:14 PM

## 2024-02-24 NOTE — Progress Notes (Signed)
 [facility]  - [place] [facility]   PATIENT NAME: Ryan [name]    MR#:  [id number]  DATE OF BIRTH:  12-07-1970  SUBJECTIVE:  no family at bedside resting quietly. Continued po diuretics and IV milrinone  gtt currently down to 10 L high flow nasal cannula oxygen    VITALS:  Blood pressure 124/82, pulse 87, temperature 98 F (36.7 C), temperature source Oral, resp. rate 15, height 5' 7 (1.702 m), weight (!) 150.9 kg, SpO2 93%.  PHYSICAL EXAMINATION:   GENERAL:  53 y.o.-year-old patient with no acute distress. Morbid obesity LUNGS: distant breath sounds bilaterally Right tunnel IJ CARDIOVASCULAR: S1, S2 normal. No murmur   ABDOMEN: Soft, nontender, nondistended. Abdominal obesity  EXTREMITIES: + edema b/l.    NEUROLOGIC: nonfocal  patient is alert and awake SKIN: No obvious rash, lesion, or ulcer.   LABORATORY PANEL:  CBC Recent Labs  Lab 02/24/24 0510  WBC 4.1  HGB 13.2  HCT 42.0  PLT 312    Chemistries  Recent Labs  Lab 02/24/24 0510  NA 138  K 3.8  CL 99  CO2 28  GLUCOSE 115*  BUN 25*  CREATININE 1.69*  CALCIUM  9.2  MG 2.2      Assessment and Plan  53 y.o. 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 In the ER, CXR showed malpositioned CVC but no infiltrates. CTA chest showed chronic PE, no new findings, no pulmonary edema.   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 daily --Continue milrinone  IV infusion --Continue Lovenox  and  Coumadin , pharmacy consulted for dosing and INR monitoring--now on coumadin . INR 2.6 --[facility] following --Discussed with ID, Bld cx most likely contamination -IR consulted, 25cm DL tunneled PICC line exchange was done on 6/23 --CXR: Increased left perihilar interstitial and patchy opacities, which may represent pulmonary edema or infection. Similar cardiomegaly. --PCCM consulted, recommended poor prognosis, palliative care consulted.    Acute gastroenteritis secondary to C. difficile and E. Coli --C. difficile antigen and PCR toxin positive --GI pathogen positive for E. Coli --Patient presented with fever, diarrhea and nausea vomiting. --DC'd broad-spectrum antibiotics vancomycin  and cefepime  --Discussed with ID, recommended to start vancomycin  orally Q6 hourly for 10 days --No need to treat E. Coli --Started probiotics     # AKI on CKD IIIa --Baseline sCr 1.4 --sCr 2.0 on admission, sCr 1.96 --1.48--1.62 --Bladder scan ruled out urinary retention --US  renal: Ruled out hydronephrosis, bladder distention, could not rule out urinary retention, PVR was not done -Avoid nephrology medication, your renal dose medication --Monitor renal functions and urine output   # Metabolic acidosis due to renal failure --Bicarb IV 150 mEq given.  Acidosis resolved, bicarbonate is now within normal range.   # Paroxysmal A-fib --Currently patient is in sinus --Continue Coumadin    # HTN, HLD, dHF 6/25 resumed Torsemide  60 mg p.o. daily,  6/24 started Aldactone  50 mg p.o. daily S/p Lasix  as above   # Type 2 diabetes A1c <7% recently.  Diet controlled - SS correction  insulin    # Hypokalemia, potassium repleted. Monitor BMP daily   # Hyponatremia Mild - Diuresis as above and trend    # Iron  deficiency, transferrin saturation 5%, started Venofer  3 mg IV daily x 3 doses followed by oral supplement at discharge.   # Vitamin D  deficiency: started vitamin D  50,000 units p.o. weekly,  follow with PCP to repeat vitamin D  level after 3 to 6 months.   # Vitamin B12 deficiency: Started vitamin B12 1000 mcg IM injection daily during hospital stay, followed by oral supplement.  Follow-up PCP to repeat vitamin B12 level after 3 to 6 months.   # Obstructive sleep apnea, continue CPAP   # Obesity class III, morbid obesity Body mass index is 53.11 kg/m.  Interventions: Calorie restricted diet and daily exercise advised to lose body weight.  Lifestyle modification discussed.     Diet: Heart healthy/carb modified diet DVT Prophylaxis: Subcutaneous Lovenox  and Coumadin  Family communication :none today Consults :[facility] cardiology CODE STATUS: Full DVT Prophylaxis :warfarin Level of care: Stepdown Status is: Inpatient Remains inpatient appropriate because: remains on milrinone  gtt and 10 L HFNC  Patient is at a high risk for cardiorespiratory arrest. Overall long-term prognosis poor    TOTAL TIME TAKING CARE OF THIS PATIENT: 45 minutes.  >50% time spent on counselling and coordination of care  Note: This dictation was prepared with Dragon dictation along with smaller phrase technology. Any transcriptional errors that result from this process are unintentional.  Leita Blanch M.D    [facility]   CC: Primary care physician; Marylynn Verneita CROME, MD

## 2024-02-24 NOTE — Progress Notes (Signed)
 Inpatient Rehab Admissions Coordinator:   Per therapy recommendations pt was screened for CIR by Izzak Fries E Jerelyn Trimarco, PT, DPT.  Note CGA or better for all mobility, though does require increased assist for LB ADLs.  In order to qualify for CIR, pt's must require intensive therapy in at least 2 disciplines.  At this time pt does not meet criteria for CIR.  If he demonstrates a further decline in function we can rescreen.   Reche Lowers, PT, DPT Admissions Coordinator [phone] 02/24/24  4:33 PM

## 2024-02-24 NOTE — Consult Note (Signed)
 Pharmacy Consult Note - Anticoagulation  Pharmacy Consult for warfarin Indication: CTEPH and atrial fibrillation  PATIENT MEASUREMENTS: Height: 5' 7 (170.2 cm) Weight: (!) 150.9 kg (332 lb 10.8 oz) IBW/kg (Calculated) : 66.1 HEPARIN  DW (KG): 104.5  VITAL SIGNS: Temp: 98 F (36.7 C) (06/27 0900) Temp Source: Oral (06/27 0900) BP: 104/75 (06/27 1200) Pulse Rate: 86 (06/27 1200)  Recent Labs    02/24/24 0510  HGB 13.2  HCT 42.0  PLT 312  LABPROT 29.2*  INR 2.6*  CREATININE 1.69*    Estimated Creatinine Clearance: 70.7 mL/min (A) (by C-G formula based on SCr of 1.69 mg/dL (H)).  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  Regular the workup has not been forthcoming) previously followed by Dr. Darcy at [facility]   Hepatic steatosis    Hypertension    Morbid obesity with BMI of 50.0-59.9, adult (HCC)    Oxygen deficiency    Peripheral vascular disease (HCC)    Presence of IVC filter    Has 2 IVC filters with significant thrombus burden superior to the filter.   Pulmonary embolus (HCC) 12/2016   Initially just treated with anticoagulation, but in February 2023 in April 2024 treated with EKOS thrombectomy for submassive PE.   Ventricular trigeminy    Weakness of both lower limbs     ASSESSMENT: 53 y.o. 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 Patient is on chronic anticoagulation per chart review. Has been on Xarelto  in the past but is currently on  warfarin with INR goal 2.5-3.5. Pharmacy has been consulted to manage warfarin while patient is admitted. INR currently subtherapeutic; no known missed doses however, per wife, medication adherence overall is question. Patient is receiving enoxaparin  until INR is therapeutic.  Pertinent medications: Warfarin 6mg  on Tuesday, Thursday, Sunday Warfarin 5mg  all other days Has 5mg  and 1mg  tablets at home Last dose 6/19 at bedtime Total weekly warfarin = 38mg   Goal(s) of therapy: INR 2.5 - 3.5 Monitor platelets by anticoagulation protocol: Yes   Date:  INR: Dose: 6/20 1.6 8 mg 6/21 2.0 8 mg 6/22 2.2 6 mg  6/23 2.0 8 mg  6/24  1.9 10 mg 6/25 2.1 10 mg 6/26 2.5 8 mg 6/27 2.6 8 mg   PLAN: Will give warfarin 8 mg po x 1  INR and CBC with AM labs  Adriana Bolster, PharmD, BCPS 02/24/2024 1:20 PM

## 2024-02-24 NOTE — Plan of Care (Signed)
  Problem: Education: Goal: Knowledge of General Education information will improve Description: Including pain rating scale, medication(s)/side effects and non-pharmacologic comfort measures Outcome: Progressing   Problem: Clinical Measurements: Goal: Ability to maintain clinical measurements within normal limits will improve Outcome: Progressing Goal: Will remain free from infection Outcome: Progressing Goal: Diagnostic test results will improve Outcome: Progressing Goal: Respiratory complications will improve Outcome: Progressing Goal: Cardiovascular complication will be avoided Outcome: Progressing   Problem: Activity: Goal: Risk for activity intolerance will decrease Outcome: Progressing   Problem: Nutrition: Goal: Adequate nutrition will be maintained Outcome: Progressing   Problem: Coping: Goal: Level of anxiety will decrease Outcome: Progressing   Problem: Elimination: Goal: Will not experience complications related to bowel motility Outcome: Progressing Goal: Will not experience complications related to urinary retention Outcome: Progressing   Problem: Pain Managment: Goal: General experience of comfort will improve and/or be controlled Outcome: Progressing

## 2024-02-25 DIAGNOSIS — I2781 Cor pulmonale (chronic): Secondary | ICD-10-CM | POA: Diagnosis not present

## 2024-02-25 LAB — BASIC METABOLIC PANEL WITH GFR
Anion gap: 11 (ref 5–15)
BUN: 26 mg/dL — ABNORMAL HIGH (ref 6–20)
CO2: 28 mmol/L (ref 22–32)
Calcium: 9.2 mg/dL (ref 8.9–10.3)
Chloride: 96 mmol/L — ABNORMAL LOW (ref 98–111)
Creatinine, Ser: 1.8 mg/dL — ABNORMAL HIGH (ref 0.61–1.24)
GFR, Estimated: 44 mL/min — ABNORMAL LOW (ref >60)
Glucose, Bld: 163 mg/dL — ABNORMAL HIGH (ref 70–99)
Potassium: 4 mmol/L (ref 3.5–5.1)
Sodium: 135 mmol/L (ref 135–145)

## 2024-02-25 LAB — CBC
HCT: 43 % (ref 39.0–52.0)
Hemoglobin: 13.6 g/dL (ref 13.0–17.0)
MCH: 27.4 pg (ref 26.0–34.0)
MCHC: 31.6 g/dL (ref 30.0–36.0)
MCV: 86.5 fL (ref 80.0–100.0)
Platelets: 287 10*3/uL (ref 150–400)
RBC: 4.97 MIL/uL (ref 4.22–5.81)
RDW: 17.7 % — ABNORMAL HIGH (ref 11.5–15.5)
WBC: 4.3 10*3/uL (ref 4.0–10.5)
nRBC: 0 % (ref 0.0–0.2)

## 2024-02-25 LAB — COOXEMETRY PANEL
Carboxyhemoglobin: 0.8 % (ref 0.5–1.5)
Methemoglobin: <0.7 % (ref 0.0–1.5)
O2 Saturation: 60.3 %
Total hemoglobin: 14.9 g/dL (ref 12.0–16.0)
Total oxygen content: 59.5 %

## 2024-02-25 LAB — GLUCOSE, CAPILLARY
Glucose-Capillary: 127 mg/dL — ABNORMAL HIGH (ref 70–99)
Glucose-Capillary: 139 mg/dL — ABNORMAL HIGH (ref 70–99)
Glucose-Capillary: 132 mg/dL — ABNORMAL HIGH (ref 70–99)
Glucose-Capillary: 116 mg/dL — ABNORMAL HIGH (ref 70–99)

## 2024-02-25 LAB — MAGNESIUM: Magnesium: 1.9 mg/dL (ref 1.7–2.4)

## 2024-02-25 LAB — PROTIME-INR
INR: 2.9 — ABNORMAL HIGH (ref 0.8–1.2)
Prothrombin Time: 31.9 s — ABNORMAL HIGH (ref 11.4–15.2)

## 2024-02-25 MED ORDER — WARFARIN SODIUM 6 MG PO TABS
6.0000 mg | ORAL_TABLET | Freq: Once | ORAL | Status: AC
  Administered 2024-02-25: 6 mg via ORAL
  Filled 2024-02-25: qty 1

## 2024-02-25 MED ORDER — MAGNESIUM SULFATE 2 GM/50ML IV SOLN
2.0000 g | Freq: Once | INTRAVENOUS | Status: AC
  Administered 2024-02-25: 2 g via INTRAVENOUS
  Filled 2024-02-25: qty 50

## 2024-02-25 NOTE — Progress Notes (Signed)
 PHARMACY CONSULT NOTE - FOLLOW UP  Pharmacy Consult for Electrolyte Monitoring and Replacement   Recent Labs: Potassium (mmol/L)  Date Value  02/25/2024 4.0  12/28/2014 3.8   Magnesium  (mg/dL)  Date Value  93/71/7974 1.9  12/28/2014 2.7 (H)   Calcium  (mg/dL)  Date Value  93/71/7974 9.2   Calcium , Total (mg/dL)  Date Value  95/69/7983 8.1 (L)   Albumin  (g/dL)  Date Value  93/72/7974 3.5  10/24/2023 3.9  12/23/2014 3.9   Phosphorus (mg/dL)  Date Value  93/74/7974 3.3  12/28/2014 6.1 (H)   Sodium (mmol/L)  Date Value  02/25/2024 135  10/24/2023 143  12/28/2014 130 (L)     Assessment: 53 y.o. 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 Pharmacy is asked to follow and replace electrolytes while in CCU  Pertinent Medications: spironolactone  50 mg po daily, torsemide  60 mg po daily  Goal of Therapy:  Potassium 4.0 - 5.1 mmol/L Magnesium  2.0 - 2.4 mg/dL All Other Electrolytes WNL   Plan:  Mg 2 g IV x 1   F/u with AM labs.   Cathaleen GORMAN Blanch ,PharmD Clinical Pharmacist 02/25/2024 10:04 AM

## 2024-02-25 NOTE — Progress Notes (Signed)
 Physical Therapy Treatment Patient Details Name: Ryan [name] MRN: [id number] DOB: Feb 11, 1971 Today's Date: 02/25/2024   History of Present Illness 53 y/o male presented to ED on 02/17/24 for worsening SOB and cough along with syncope. Admitted for suspected cor pulmonale and pulmonary HTN. 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 Pt this date able to complete bed mobility with supervision assist, needing an increased time to complete transition. STS transfer with use of RW, verbal cueing needed on manipulation of AD along with self-propelling techniques. Pt began session with 15L of O2 with an SpO2 of 100%, brought down to 9L and remained above 90% the rest of the session. Lateral sidesteps taken towards recliner this date with use of RW, pt stating he feels generally weak. Pt d/c recommendations changed to HHPT to best meet pt's needs as he feels capable of taking  care of himself with his wife. PT to follow acutely as appropriate.     If plan is discharge home, recommend the following: A little help with walking and/or transfers;A little help with bathing/dressing/bathroom;Assistance with cooking/housework;Assist for transportation;Help with stairs or ramp for entrance   Can travel by private vehicle        Equipment Recommendations  Other (comment)    Recommendations for Other Services       Precautions / Restrictions Precautions Precautions: Fall Recall of Precautions/Restrictions: Intact Restrictions Weight Bearing Restrictions Per Provider Order: No     Mobility  Bed Mobility Overal bed mobility: Needs Assistance Bed Mobility: Supine to Sit     Supine to sit: Supervision     General bed mobility comments: supervision assist this date, increased time to complete transition    Transfers Overall transfer level: Needs  assistance Equipment used: Rolling walker (2 wheels) Transfers: Sit to/from Stand Sit to Stand: Contact guard assist, +2 safety/equipment           General transfer comment: CGA for transfer, increased time to complete, no LOB experienced    Ambulation/Gait Ambulation/Gait assistance: Contact guard assist Gait Distance (Feet): 5 Feet Assistive device: Rolling walker (2 wheels) Gait Pattern/deviations: Step-to pattern       General Gait Details: sidesteps towards HOB. On 9L O2, spO2 maintaining >90%   Stairs             Wheelchair Mobility     Tilt Bed    Modified Rankin (Stroke Patients Only)       Balance Overall balance assessment: Needs assistance Sitting-balance support: No upper extremity supported, Feet supported Sitting balance-Leahy Scale: Fair Sitting balance - Comments: sitting EOB   Standing balance support: Bilateral upper extremity supported Standing balance-Leahy Scale: Fair Standing balance comment: use of RW, no LOB                            Communication Communication Communication: No apparent difficulties  Cognition Arousal: Alert Behavior During Therapy: WFL for tasks assessed/performed   PT - Cognitive impairments: No apparent impairments                         Following commands: Intact      Cueing Cueing Techniques: Verbal cues, Tactile cues  Exercises General Exercises - Lower Extremity Ankle Circles/Pumps: AROM, Both, 10 reps, Supine Straight Leg Raises: AROM, Both, 10 reps, Supine  General Comments        Pertinent Vitals/Pain Pain Assessment Pain Assessment: No/denies pain    Home Living                          Prior Function            PT Goals (current goals can now be found in the care plan section) Acute Rehab PT Goals Patient Stated Goal: to get better PT Goal Formulation: With patient Time For Goal Achievement: 03/09/24 Potential to Achieve Goals:  Good Progress towards PT goals: Progressing toward goals    Frequency    Min 2X/week      PT Plan      Co-evaluation     PT goals addressed during session: Mobility/safety with mobility        AM-PAC PT 6 Clicks Mobility   Outcome Measure  Help needed turning from your back to your side while in a flat bed without using bedrails?: A Little Help needed moving from lying on your back to sitting on the side of a flat bed without using bedrails?: A Little Help needed moving to and from a bed to a chair (including a wheelchair)?: A Little Help needed standing up from a chair using your arms (e.g., wheelchair or bedside chair)?: A Little Help needed to walk in hospital room?: A Little Help needed climbing 3-5 steps with a railing? : A Little 6 Click Score: 18    End of Session Equipment Utilized During Treatment: Oxygen Activity Tolerance: Patient tolerated treatment well Patient left: in chair;with call bell/phone within reach Nurse Communication: Mobility status PT Visit Diagnosis: Unsteadiness on feet (R26.81);Muscle weakness (generalized) (M62.81);Other abnormalities of gait and mobility (R26.89)     Time: 8678-8653 PT Time Calculation (min) (ACUTE ONLY): 25 min  Charges:    $Therapeutic Exercise: 8-22 mins $Therapeutic Activity: 8-22 mins PT General Charges $$ ACUTE PT VISIT: 1 Visit                        Odilon Cass Romero-Perozo, SPT  02/25/2024, 2:32 PM

## 2024-02-25 NOTE — Consult Note (Signed)
 Pharmacy Consult Note - Anticoagulation  Pharmacy Consult for warfarin Indication: CTEPH and atrial fibrillation  PATIENT MEASUREMENTS: Height: 5' 7 (170.2 cm) Weight: (!) 154.1 kg (339 lb 11.7 oz) IBW/kg (Calculated) : 66.1 HEPARIN  DW (KG): 104.5  VITAL SIGNS: Temp: 97.5 F (36.4 C) (06/28 0800) Temp Source: Oral (06/28 0800) BP: 106/66 (06/28 0900) Pulse Rate: 100 (06/28 0900)  Recent Labs    02/25/24 0525 02/25/24 0526  HGB  --  13.6  HCT  --  43.0  PLT  --  287  LABPROT 31.9*  --   INR 2.9*  --   CREATININE 1.80*  --     Estimated Creatinine Clearance: 67.2 mL/min (A) (by C-G formula based on SCr of 1.8 mg/dL (H)).  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  Regular the workup has not been forthcoming) previously followed by Dr. Darcy at [facility]   Hepatic steatosis    Hypertension    Morbid obesity with BMI of 50.0-59.9, adult (HCC)    Oxygen deficiency    Peripheral vascular disease (HCC)    Presence of IVC filter    Has 2 IVC filters with significant thrombus burden superior to the filter.   Pulmonary embolus (HCC) 12/2016   Initially just treated with anticoagulation, but in February 2023 in April 2024 treated with EKOS thrombectomy for submassive PE.   Ventricular trigeminy    Weakness of both lower limbs     ASSESSMENT: 53 y.o. 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 Patient is on chronic anticoagulation per chart review. Has  been on Xarelto  in the past but is currently on warfarin with INR goal 2.5-3.5. Pharmacy has been consulted to manage warfarin while patient is admitted. INR currently subtherapeutic; no known missed doses however, per wife, medication adherence overall is question. Patient is receiving enoxaparin  until INR is therapeutic.  Pertinent medications: Warfarin 6mg  on Tuesday, Thursday, Sunday Warfarin 5mg  all other days Has 5mg  and 1mg  tablets at home Last dose 6/19 at bedtime Total weekly warfarin = 38mg   Goal(s) of therapy: INR 2.5 - 3.5 Monitor platelets by anticoagulation protocol: Yes   Date:  INR: Dose: 6/20 1.6 8 mg 6/21 2.0 8 mg 6/22 2.2 6 mg  6/23 2.0 8 mg  6/24  1.9 10 mg 6/25 2.1 10 mg 6/26 2.5 8 mg 6/27 2.6 8 mg 6/28 2.9 6 mg    PLAN: INR is trending up. Will give warfarin 6 mg dose today. INR probably continues to trend up due to 10 mg doses on the 24 and 25. Daily INR and CBC at least every 3 days.   Cathaleen Blanch, PharmD, BCPS 02/25/2024 10:13 AM

## 2024-02-25 NOTE — Plan of Care (Signed)

## 2024-02-25 NOTE — Plan of Care (Signed)
  Problem: Education: Goal: Knowledge of General Education information will improve Description: Including pain rating scale, medication(s)/side effects and non-pharmacologic comfort measures Outcome: Progressing   Problem: Health Behavior/Discharge Planning: Goal: Ability to manage health-related needs will improve Outcome: Progressing   Problem: Clinical Measurements: Goal: Ability to maintain clinical measurements within normal limits will improve Outcome: Progressing Goal: Diagnostic test results will improve Outcome: Progressing Goal: Respiratory complications will improve Outcome: Progressing Goal: Cardiovascular complication will be avoided Outcome: Progressing   Problem: Activity: Goal: Risk for activity intolerance will decrease Outcome: Progressing   Problem: Nutrition: Goal: Adequate nutrition will be maintained Outcome: Progressing   Problem: Elimination: Goal: Will not experience complications related to bowel motility Outcome: Progressing Goal: Will not experience complications related to urinary retention Outcome: Progressing   Problem: Pain Managment: Goal: General experience of comfort will improve and/or be controlled Outcome: Progressing

## 2024-02-25 NOTE — Progress Notes (Signed)
 [facility]  - [place] at [facility]   PATIENT NAME: Ryan [name]    MR#:  [id number]  DATE OF BIRTH:  [birth year]  SUBJECTIVE:  no family at bedside resting quietly. Continued po diuretics and IV milrinone  gtt currently down to 9 L high flow nasal cannula oxygen. Worked well with PT    VITALS:  Blood pressure (!) 129/94, pulse 87, temperature 98.8 F (37.1 C), temperature source Axillary, resp. rate 14, height 5' 7 (1.702 m), weight (!) 154.1 kg, SpO2 90%.  PHYSICAL EXAMINATION:   GENERAL:  53 y.o.-year-old patient with no acute distress. Morbid obesity LUNGS: distant breath sounds bilaterally Right tunnel IJ CARDIOVASCULAR: S1, S2 normal. No murmur   ABDOMEN: Soft, nontender, nondistended. Abdominal obesity  EXTREMITIES: + edema b/l.    NEUROLOGIC: nonfocal  patient is alert and awake SKIN: No obvious rash, lesion, or ulcer.   LABORATORY PANEL:  CBC Recent Labs  Lab 02/25/24 0526  WBC 4.3  HGB 13.6  HCT 43.0  PLT 287    Chemistries  Recent Labs  Lab 02/24/24 1445 02/25/24 0525  NA  --  135  K  --  4.0  CL  --  96*  CO2  --  28  GLUCOSE  --  163*  BUN  --  26*  CREATININE  --  1.80*  CALCIUM   --  9.2  MG  --  1.9  AST 71*  --   ALT 54*  --   ALKPHOS 45  --   BILITOT 1.0  --       Assessment and Plan  53 y.o. 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 In the ER, CXR showed malpositioned CVC but no infiltrates. CTA chest showed chronic PE, no new findings, no pulmonary edema.   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 daily --Continue milrinone  IV infusion --Continue Lovenox  and Coumadin , pharmacy consulted for dosing and INR monitoring--now on coumadin . INR 2.6 --[facility] following --Discussed with ID, Bld cx most likely contamination -IR consulted, 25cm DL tunneled PICC line exchange was done on 6/23 --CXR: Increased left perihilar interstitial and patchy opacities, which may represent pulmonary edema or infection. Similar cardiomegaly. --PCCM consulted, recommended poor prognosis, palliative care consulted.    Acute gastroenteritis secondary to C. difficile and E. Coli --C. difficile antigen and PCR toxin positive --GI pathogen positive for E. Coli --Patient presented with fever, diarrhea and nausea vomiting. --DC'd broad-spectrum antibiotics vancomycin  and cefepime  --Discussed with ID, recommended to start vancomycin  orally Q6 hourly for 10 days --No need to treat E. Coli --Started probiotics   # AKI on CKD IIIa --Baseline sCr 1.4 --sCr 2.0 on admission, sCr 1.96 --1.48--1.62 --Bladder scan ruled out urinary retention --US  renal: Ruled out hydronephrosis, bladder distention, could not rule out urinary retention, PVR was not done -Avoid nephrology medication, your renal dose medication --Monitor renal functions and urine output   # Metabolic acidosis due to renal failure --Bicarb IV 150 mEq given.  Acidosis resolved, bicarbonate is  now within normal range.   # Paroxysmal A-fib --Currently patient is in sinus --Continue Coumadin    # HTN, HLD, dHF 6/25 resumed Torsemide  60 mg p.o. daily,  6/24 started Aldactone  50 mg p.o. daily S/p Lasix  as above   # Type 2 diabetes A1c <7% recently.  Diet controlled - SS correction insulin    # Hypokalemia, potassium repleted. Monitor BMP daily   # Hyponatremia Mild - Diuresis as above and trend    # Iron  deficiency, transferrin saturation 5%,  started Venofer  3 mg IV daily x 3 doses followed by oral supplement at discharge.   # Vitamin D  deficiency: started vitamin D  50,000 units p.o. weekly, follow with PCP to repeat vitamin D  level after 3 to 6 months.   # Vitamin B12 deficiency: Started vitamin B12 1000 mcg IM injection daily during hospital stay, followed by oral supplement.  Follow-up PCP to repeat vitamin B12 level after 3 to 6 months.   # Obstructive sleep apnea, continue CPAP   # Obesity class III, morbid obesity Body mass index is 53.11 kg/m.  Interventions: Calorie restricted diet and daily exercise advised to lose body weight.  Lifestyle modification discussed.     Diet: Heart healthy/carb modified diet DVT Prophylaxis: Subcutaneous Lovenox  and Coumadin  Family communication :none today Consults :[facility] cardiology CODE STATUS: Full DVT Prophylaxis :warfarin Level of care: Stepdown Status is: Inpatient Remains inpatient appropriate because: remains on milrinone  gtt and 10 L HFNC  Patient is at a high risk for cardiorespiratory arrest. Overall long-term prognosis poor    TOTAL TIME TAKING CARE OF THIS PATIENT: 45 minutes.  >50% time spent on counselling and coordination of care  Note: This dictation was prepared with Dragon dictation along with smaller phrase technology. Any transcriptional errors that result from this process are unintentional.  Trevone Prestwood M.D    [facility]   CC: Primary care physician; Marylynn Verneita CROME, MD

## 2024-02-26 DIAGNOSIS — I2781 Cor pulmonale (chronic): Secondary | ICD-10-CM | POA: Diagnosis not present

## 2024-02-26 LAB — CBC
HCT: 43.1 % (ref 39.0–52.0)
Hemoglobin: 13.7 g/dL (ref 13.0–17.0)
MCH: 27.3 pg (ref 26.0–34.0)
MCHC: 31.8 g/dL (ref 30.0–36.0)
MCV: 85.9 fL (ref 80.0–100.0)
Platelets: 336 10*3/uL (ref 150–400)
RBC: 5.02 MIL/uL (ref 4.22–5.81)
RDW: 17.8 % — ABNORMAL HIGH (ref 11.5–15.5)
WBC: 5.8 10*3/uL (ref 4.0–10.5)
nRBC: 0 % (ref 0.0–0.2)

## 2024-02-26 LAB — BASIC METABOLIC PANEL WITH GFR
Anion gap: 10 (ref 5–15)
BUN: 30 mg/dL — ABNORMAL HIGH (ref 6–20)
CO2: 29 mmol/L (ref 22–32)
Calcium: 9.3 mg/dL (ref 8.9–10.3)
Chloride: 96 mmol/L — ABNORMAL LOW (ref 98–111)
Creatinine, Ser: 2.09 mg/dL — ABNORMAL HIGH (ref 0.61–1.24)
GFR, Estimated: 37 mL/min — ABNORMAL LOW (ref >60)
Glucose, Bld: 118 mg/dL — ABNORMAL HIGH (ref 70–99)
Potassium: 4.3 mmol/L (ref 3.5–5.1)
Sodium: 135 mmol/L (ref 135–145)

## 2024-02-26 LAB — PHOSPHORUS: Phosphorus: 4.2 mg/dL (ref 2.5–4.6)

## 2024-02-26 LAB — COOXEMETRY PANEL
Carboxyhemoglobin: 0.9 % (ref 0.5–1.5)
Carboxyhemoglobin: 1.1 % (ref 0.5–1.5)
Carboxyhemoglobin: 0.8 % (ref 0.5–1.5)
Methemoglobin: 0 % (ref 0.0–1.5)
Methemoglobin: <0.7 % (ref 0.0–1.5)
Methemoglobin: <0.7 % (ref 0.0–1.5)
O2 Saturation: 60 %
O2 Saturation: 63.7 %
O2 Saturation: 58.7 %
Total hemoglobin: 15 g/dL (ref 12.0–16.0)
Total hemoglobin: 14.4 g/dL (ref 12.0–16.0)
Total hemoglobin: 14.4 g/dL (ref 12.0–16.0)
Total oxygen content: 59.5 %
Total oxygen content: 62.8 %
Total oxygen content: 57.9 %

## 2024-02-26 LAB — CULTURE, BLOOD (ROUTINE X 2)
Culture: NO GROWTH
Culture: NO GROWTH
Special Requests: ADEQUATE
Special Requests: ADEQUATE

## 2024-02-26 LAB — PROTIME-INR
INR: 3 — ABNORMAL HIGH (ref 0.8–1.2)
Prothrombin Time: 32.2 s — ABNORMAL HIGH (ref 11.4–15.2)

## 2024-02-26 LAB — GLUCOSE, CAPILLARY
Glucose-Capillary: 117 mg/dL — ABNORMAL HIGH (ref 70–99)
Glucose-Capillary: 137 mg/dL — ABNORMAL HIGH (ref 70–99)
Glucose-Capillary: 146 mg/dL — ABNORMAL HIGH (ref 70–99)
Glucose-Capillary: 127 mg/dL — ABNORMAL HIGH (ref 70–99)

## 2024-02-26 LAB — MAGNESIUM: Magnesium: 2.1 mg/dL (ref 1.7–2.4)

## 2024-02-26 MED ORDER — WARFARIN SODIUM 5 MG PO TABS
5.0000 mg | ORAL_TABLET | Freq: Once | ORAL | Status: AC
  Administered 2024-02-26: 5 mg via ORAL
  Filled 2024-02-26: qty 1

## 2024-02-26 NOTE — Plan of Care (Signed)
  Problem: Education: Goal: Knowledge of General Education information will improve Description: Including pain rating scale, medication(s)/side effects and non-pharmacologic comfort measures Outcome: Progressing   Problem: Clinical Measurements: Goal: Ability to maintain clinical measurements within normal limits will improve Outcome: Progressing Goal: Will remain free from infection Outcome: Progressing Goal: Diagnostic test results will improve Outcome: Progressing Goal: Respiratory complications will improve Outcome: Progressing   Problem: Nutrition: Goal: Adequate nutrition will be maintained Outcome: Progressing   Problem: Coping: Goal: Level of anxiety will decrease Outcome: Progressing   Problem: Elimination: Goal: Will not experience complications related to bowel motility Outcome: Progressing Goal: Will not experience complications related to urinary retention Outcome: Progressing   Problem: Pain Managment: Goal: General experience of comfort will improve and/or be controlled Outcome: Progressing   Problem: Clinical Measurements: Goal: Cardiovascular complication will be avoided Outcome: Not Progressing   Problem: Activity: Goal: Risk for activity intolerance will decrease Outcome: Not Progressing

## 2024-02-26 NOTE — Plan of Care (Signed)
  Problem: Education: Goal: Knowledge of General Education information will improve Description: Including pain rating scale, medication(s)/side effects and non-pharmacologic comfort measures Outcome: Progressing   Problem: Clinical Measurements: Goal: Cardiovascular complication will be avoided Outcome: Progressing   Problem: Activity: Goal: Risk for activity intolerance will decrease Outcome: Progressing   Problem: Nutrition: Goal: Adequate nutrition will be maintained Outcome: Progressing   Problem: Elimination: Goal: Will not experience complications related to bowel motility Outcome: Progressing Goal: Will not experience complications related to urinary retention Outcome: Progressing   Problem: Pain Managment: Goal: General experience of comfort will improve and/or be controlled Outcome: Progressing   Problem: Health Behavior/Discharge Planning: Goal: Ability to manage health-related needs will improve Outcome: Not Progressing

## 2024-02-26 NOTE — Progress Notes (Signed)
 PHARMACY CONSULT NOTE  Pharmacy Consult for Electrolyte Monitoring and Replacement   Recent Labs: Potassium (mmol/L)  Date Value  02/26/2024 4.3  12/28/2014 3.8   Magnesium  (mg/dL)  Date Value  93/70/7974 2.1  12/28/2014 2.7 (H)   Calcium  (mg/dL)  Date Value  93/70/7974 9.3   Calcium , Total (mg/dL)  Date Value  95/69/7983 8.1 (L)   Albumin  (g/dL)  Date Value  93/72/7974 3.5  10/24/2023 3.9  12/23/2014 3.9   Phosphorus (mg/dL)  Date Value  93/70/7974 4.2  12/28/2014 6.1 (H)   Sodium (mmol/L)  Date Value  02/26/2024 135  10/24/2023 143  12/28/2014 130 (L)     Assessment: 53 y.o. 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 Pharmacy is asked to follow and replace electrolytes while in CCU  Pertinent Medications: spironolactone  50 mg po daily, torsemide  60 mg po daily  Goal of Therapy:  Potassium 4.0 - 5.1 mmol/L Magnesium  2.0 - 2.4 mg/dL All Other Electrolytes WNL   Plan:  No electrolyte replacement indicated F/u labs tomorrow AM as already ordered  Ryan [name] Mare 02/26/2024 7:53 AM

## 2024-02-26 NOTE — Progress Notes (Signed)
 Physical Therapy Treatment Patient Details Name: Ryan [name] MRN: [id number] DOB: [birth year] Today's Date: 02/26/2024   History of Present Illness 53 y/o male presented to ED on 02/17/24 for worsening SOB and cough along with syncope. Admitted for suspected cor pulmonale and pulmonary HTN. 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  He is able to get to EOB without assist besides line management.  Steady in sitting.  Some dizziness noted but does relieve with time.  He is able to stand and transfer to [facility] with light use of RW.  Large soft BM noted and while he tries his own care, assist is needed to fully clean.  He then transfers to recliner.  After short rest, he does stand 3 additional times with supervision for self selected standing ex with occasional UE support to his tolerance.  Overall does well with mobility.  Remained in recliner after session.   If plan is discharge home, recommend the following: A little help with walking and/or transfers;A little help with bathing/dressing/bathroom;Assistance with cooking/housework;Assist for transportation;Help with stairs or ramp for entrance   Can travel by private vehicle        Equipment Recommendations       Recommendations for Other Services       Precautions / Restrictions Precautions Precautions: Fall Recall of Precautions/Restrictions: Intact Restrictions Weight Bearing Restrictions Per Provider Order: No     Mobility  Bed Mobility Overal bed mobility: Needs Assistance Bed Mobility: Supine to Sit     Supine to sit: Supervision       Patient Response: Cooperative  Transfers Overall transfer level: Needs assistance Equipment used: Rolling walker (2 wheels) Transfers: Sit to/from Stand Sit to Stand: Supervision, Contact guard assist           General transfer comment: assist for managing lines and leads     Ambulation/Gait Ambulation/Gait assistance: Contact guard assist Gait Distance (Feet): 5 Feet Assistive device: Rolling walker (2 wheels) Gait Pattern/deviations: Step-to pattern Gait velocity: decreased     General Gait Details: to [facility] then to recliner   Stairs             Wheelchair Mobility     Tilt Bed Tilt Bed Patient Response: Cooperative  Modified Rankin (Stroke Patients Only)       Balance Overall balance assessment: Mild deficits observed, not formally tested                                          Communication Communication Communication: No apparent difficulties  Cognition Arousal: Alert Behavior During Therapy: WFL for tasks assessed/performed   PT - Cognitive impairments: No apparent impairments                         Following commands: Intact      Cueing Cueing Techniques: Verbal cues, Tactile cues  Exercises Other Exercises Other Exercises: standing AROM with walker self directed    General Comments        Pertinent Vitals/Pain Pain Assessment Pain Assessment: No/denies pain    Home Living                          Prior Function  PT Goals (current goals can now be found in the care plan section) Progress towards PT goals: Progressing toward goals    Frequency    Min 2X/week      PT Plan      Co-evaluation              AM-PAC PT 6 Clicks Mobility   Outcome Measure  Help needed turning from your back to your side while in a flat bed without using bedrails?: A Little Help needed moving from lying on your back to sitting on the side of a flat bed without using bedrails?: A Little Help needed moving to and from a bed to a chair (including a wheelchair)?: A Little Help needed standing up from a chair using your arms (e.g., wheelchair or bedside chair)?: A Little Help needed to walk in hospital room?: A Little Help needed climbing 3-5 steps with a railing? :  A Little 6 Click Score: 18    End of Session Equipment Utilized During Treatment: Oxygen Activity Tolerance: Patient tolerated treatment well Patient left: in chair;with call bell/phone within reach Nurse Communication: Mobility status PT Visit Diagnosis: Unsteadiness on feet (R26.81);Muscle weakness (generalized) (M62.81);Other abnormalities of gait and mobility (R26.89)     Time: 9042-8965 PT Time Calculation (min) (ACUTE ONLY): 37 min  Charges:    $Therapeutic Activity: 23-37 mins PT General Charges $$ ACUTE PT VISIT: 1 Visit                   Lauraine Gills, PTA 02/26/24, 11:21 AM

## 2024-02-26 NOTE — Consult Note (Signed)
 Pharmacy Consult Note - Anticoagulation  Pharmacy Consult for warfarin Indication: CTEPH and atrial fibrillation  PATIENT MEASUREMENTS: Height: 5' 7 (170.2 cm) Weight: (!) 148.6 kg (327 lb 9.7 oz) IBW/kg (Calculated) : 66.1 HEPARIN  DW (KG): 104.5  VITAL SIGNS: Temp: 98.8 F (37.1 C) (06/29 0800) Temp Source: Axillary (06/29 0800) BP: 116/83 (06/29 0700) Pulse Rate: 90 (06/29 0800)  Recent Labs    02/26/24 0543  HGB 13.7  HCT 43.1  PLT 336  LABPROT 32.2*  INR 3.0*  CREATININE 2.09*    Estimated Creatinine Clearance: 56.6 mL/min (A) (by C-G formula based on SCr of 2.09 mg/dL (H)).  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  Regular the workup has not been forthcoming) previously followed by [facility]. Darcy at [facility]   Hepatic steatosis    Hypertension    Morbid obesity with BMI of 50.0-59.9, adult (HCC)    Oxygen deficiency    Peripheral vascular disease (HCC)    Presence of IVC filter    Has 2 IVC filters with significant thrombus burden superior to the filter.   Pulmonary embolus (HCC) 12/2016   Initially just treated with anticoagulation, but in February 2023 in April 2024 treated with EKOS thrombectomy for submassive PE.   Ventricular trigeminy    Weakness of both lower limbs     ASSESSMENT: 52 y.o. 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 Patient is on chronic anticoagulation per chart review. Has been on Xarelto  in the past but is currently  on warfarin with INR goal 2.5-3.5. Pharmacy has been consulted to manage warfarin while patient is admitted. INR currently subtherapeutic; no known missed doses however, per wife, medication adherence overall is question. Patient is receiving enoxaparin  until INR is therapeutic.  Pertinent medications: Warfarin 6mg  on Tuesday, Thursday, Sunday Warfarin 5mg  all other days Has 5mg  and 1mg  tablets at home Last dose 6/19 at bedtime Total weekly warfarin = 38mg   Goal(s) of therapy: INR 2.5 - 3.5 Monitor platelets by anticoagulation protocol: Yes   Date:  INR: Dose: 6/20 1.6 8 mg 6/21 2.0 8 mg 6/22 2.2 6 mg  6/23 2.0 8 mg  6/24  1.9 10 mg 6/25 2.1 10 mg 6/26 2.5 8 mg 6/27 2.6 8 mg 6/28 2.9 6 mg  6/29 3 5  mg   PLAN: INR is trending up. Will give warfarin 5 mg dose today. Probably can restart home dose 6/30. Daily INR and CBC at least every 3 days.   Cathaleen Blanch, PharmD, BCPS 02/26/2024 8:18 AM

## 2024-02-26 NOTE — Progress Notes (Signed)
 [facility]  - [place] at [facility]   PATIENT NAME: Ryan [name]    MR#:  [id number]  DATE OF BIRTH:  April 22, 1971  SUBJECTIVE:  no family at bedside resting quietly. Continued po diuretics and IV milrinone  gtt currently down to 9 L high flow nasal cannula oxygen. Worked well with PT    VITALS:  Blood pressure 134/76, pulse 94, temperature 98.7 F (37.1 C), temperature source Axillary, resp. rate 12, height 5' 7 (1.702 m), weight (!) 148.6 kg, SpO2 95%.  PHYSICAL EXAMINATION:   GENERAL:  53 y.o.-year-old patient with no acute distress. Morbid obesity LUNGS: distant breath sounds bilaterally Right tunnel IJ CARDIOVASCULAR: S1, S2 normal. No murmur   ABDOMEN: Soft, nontender, nondistended. Abdominal obesity  EXTREMITIES: + edema b/l.    NEUROLOGIC: nonfocal  patient is alert and awake  LABORATORY PANEL:  CBC Recent Labs  Lab 02/26/24 0543  WBC 5.8  HGB 13.7  HCT 43.1  PLT 336    Chemistries  Recent Labs  Lab 02/24/24 1445 02/25/24 0525 02/26/24 0543  NA  --    < > 135  K  --    < > 4.3  CL  --    < > 96*  CO2  --    < > 29  GLUCOSE  --    < > 118*  BUN  --    < > 30*  CREATININE  --    < > 2.09*  CALCIUM   --    < > 9.3  MG  --    < > 2.1  AST 71*  --   --   ALT 54*  --   --   ALKPHOS 45  --   --   BILITOT 1.0  --   --    < > = values in this interval not displayed.      Assessment and Plan  53 y.o. 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 In the ER, CXR showed malpositioned CVC but no infiltrates. CTA chest showed chronic PE, no new findings, no pulmonary edema.   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 daily --Continue milrinone  IV infusion --Continue Lovenox  and Coumadin , pharmacy consulted for dosing and INR monitoring--now on coumadin . INR 2.6 --[facility] following --Discussed with ID, Bld cx most likely contamination -IR consulted, 25cm DL tunneled PICC line exchange was done on 6/23 --CXR: Increased left perihilar interstitial and patchy opacities, which may represent pulmonary edema or infection. Similar cardiomegaly. --PCCM consulted, overall long term poor prognosis, palliative care consulted.    Acute gastroenteritis secondary to C. difficile and E. Coli --C. difficile antigen and PCR toxin positive --GI pathogen positive for E. Coli --Patient presented with fever, diarrhea and nausea vomiting. --DC'd broad-spectrum antibiotics vancomycin  and cefepime  --Discussed with ID, recommended to start vancomycin  orally Q6 hourly for 10 days --No need to treat E. Coli --Started probiotics   # AKI on CKD IIIa --Baseline sCr 1.4 --sCr 2.0 on admission, sCr 1.96 --1.48--1.62--2.09 --US   renal: Ruled out hydronephrosis, bladder distention, could not rule out urinary retention, PVR was not done -Avoid nephrology medication, your renal dose medication   # Metabolic acidosis due to renal failure --Bicarb IV 150 mEq given.  Acidosis resolved, bicarbonate is now within normal range.   # Paroxysmal A-fib --Currently patient is in sinus --Continue Coumadin    # HTN, HLD, dHF 6/25 resumed Torsemide  60 mg p.o. daily,  6/24 started Aldactone  50 mg p.o. daily S/p Lasix  as above   # Type 2 diabetes A1c <7% recently.  Diet controlled - SS correction insulin    # Hypokalemia, potassium repleted. Monitor BMP daily   # Hyponatremia Mild - Diuresis as above and trend    # Iron  deficiency, transferrin  saturation 5%, started Venofer  3 mg IV daily x 3 doses followed by oral supplement at discharge.   # Vitamin D  deficiency: started vitamin D  50,000 units p.o. weekly, follow with PCP to repeat vitamin D  level after 3 to 6 months.   # Vitamin B12 deficiency: Started vitamin B12 1000 mcg IM injection daily during hospital stay, followed by oral supplement.  Follow-up PCP to repeat vitamin B12 level after 3 to 6 months.   # Obstructive sleep apnea, continue CPAP   # Obesity class III, morbid obesity Body mass index is 53.11 kg/m.  Interventions: Calorie restricted diet and daily exercise advised to lose body weight.  Lifestyle modification discussed.     Diet: Heart healthy/carb modified diet DVT Prophylaxis: Subcutaneous Lovenox  and Coumadin  Family communication :none today Consults :[facility] cardiology CODE STATUS: Full DVT Prophylaxis :warfarin Level of care: Stepdown Status is: Inpatient Remains inpatient appropriate because: remains on milrinone  gtt and 10 L HFNC  Patient is at a high risk for cardiorespiratory arrest. Overall long-term prognosis poor    TOTAL TIME TAKING CARE OF THIS PATIENT: 35 minutes.  >50% time spent on counselling and coordination of care  Note: This dictation was prepared with Dragon dictation along with smaller phrase technology. Any transcriptional errors that result from this process are unintentional.  Quante Pettry M.D    [facility]   CC: Primary care physician; Marylynn Verneita CROME, MD

## 2024-02-27 DIAGNOSIS — I2781 Cor pulmonale (chronic): Secondary | ICD-10-CM | POA: Diagnosis not present

## 2024-02-27 LAB — CBC WITH DIFFERENTIAL/PLATELET
Abs Immature Granulocytes: 0.04 10*3/uL (ref 0.00–0.07)
Basophils Absolute: 0 10*3/uL (ref 0.0–0.1)
Basophils Relative: 0 %
Eosinophils Absolute: 0.1 10*3/uL (ref 0.0–0.5)
Eosinophils Relative: 1 %
HCT: 41.5 % (ref 39.0–52.0)
Hemoglobin: 13 g/dL (ref 13.0–17.0)
Immature Granulocytes: 0 %
Lymphocytes Relative: 12 %
Lymphs Abs: 1.2 10*3/uL (ref 0.7–4.0)
MCH: 26.7 pg (ref 26.0–34.0)
MCHC: 31.3 g/dL (ref 30.0–36.0)
MCV: 85.2 fL (ref 80.0–100.0)
Monocytes Absolute: 1.2 10*3/uL — ABNORMAL HIGH (ref 0.1–1.0)
Monocytes Relative: 12 %
Neutro Abs: 7.6 10*3/uL (ref 1.7–7.7)
Neutrophils Relative %: 75 %
Platelets: 341 10*3/uL (ref 150–400)
RBC: 4.87 MIL/uL (ref 4.22–5.81)
RDW: 17.9 % — ABNORMAL HIGH (ref 11.5–15.5)
WBC: 10.1 10*3/uL (ref 4.0–10.5)
nRBC: 0 % (ref 0.0–0.2)

## 2024-02-27 LAB — HEPATIC FUNCTION PANEL
ALT: 46 U/L — ABNORMAL HIGH (ref 0–44)
AST: 42 U/L — ABNORMAL HIGH (ref 15–41)
Albumin: 3.5 g/dL (ref 3.5–5.0)
Alkaline Phosphatase: 49 U/L (ref 38–126)
Bilirubin, Direct: 0.2 mg/dL (ref 0.0–0.2)
Indirect Bilirubin: 1 mg/dL — ABNORMAL HIGH (ref 0.3–0.9)
Total Bilirubin: 1.2 mg/dL (ref 0.0–1.2)
Total Protein: 7.6 g/dL (ref 6.5–8.1)

## 2024-02-27 LAB — COOXEMETRY PANEL
Carboxyhemoglobin: 1.3 % (ref 0.5–1.5)
Carboxyhemoglobin: 1.6 % — ABNORMAL HIGH (ref 0.5–1.5)
Carboxyhemoglobin: 1 % (ref 0.5–1.5)
Methemoglobin: 0.8 % (ref 0.0–1.5)
Methemoglobin: 1 % (ref 0.0–1.5)
Methemoglobin: <0.7 % (ref 0.0–1.5)
O2 Saturation: 66.8 %
O2 Saturation: 71.6 %
O2 Saturation: 66.1 %
Total hemoglobin: 15.1 g/dL (ref 12.0–16.0)
Total hemoglobin: 13.8 g/dL (ref 12.0–16.0)
Total hemoglobin: 14.3 g/dL (ref 12.0–16.0)
Total oxygen content: 65.4 %
Total oxygen content: 69.7 %
Total oxygen content: 65.2 %

## 2024-02-27 LAB — BASIC METABOLIC PANEL WITH GFR
Anion gap: 12 (ref 5–15)
BUN: 33 mg/dL — ABNORMAL HIGH (ref 6–20)
CO2: 28 mmol/L (ref 22–32)
Calcium: 9.2 mg/dL (ref 8.9–10.3)
Chloride: 95 mmol/L — ABNORMAL LOW (ref 98–111)
Creatinine, Ser: 2.31 mg/dL — ABNORMAL HIGH (ref 0.61–1.24)
GFR, Estimated: 33 mL/min — ABNORMAL LOW (ref >60)
Glucose, Bld: 122 mg/dL — ABNORMAL HIGH (ref 70–99)
Potassium: 4.5 mmol/L (ref 3.5–5.1)
Sodium: 135 mmol/L (ref 135–145)

## 2024-02-27 LAB — GLUCOSE, CAPILLARY
Glucose-Capillary: 136 mg/dL — ABNORMAL HIGH (ref 70–99)
Glucose-Capillary: 140 mg/dL — ABNORMAL HIGH (ref 70–99)
Glucose-Capillary: 150 mg/dL — ABNORMAL HIGH (ref 70–99)
Glucose-Capillary: 127 mg/dL — ABNORMAL HIGH (ref 70–99)

## 2024-02-27 LAB — PROTIME-INR
INR: 3.2 — ABNORMAL HIGH (ref 0.8–1.2)
Prothrombin Time: 34.3 s — ABNORMAL HIGH (ref 11.4–15.2)

## 2024-02-27 LAB — PROCALCITONIN: Procalcitonin: 0.13 ng/mL

## 2024-02-27 MED ORDER — WARFARIN SODIUM 5 MG PO TABS
5.0000 mg | ORAL_TABLET | Freq: Once | ORAL | Status: AC
  Administered 2024-02-27: 5 mg via ORAL
  Filled 2024-02-27: qty 1

## 2024-02-27 MED ORDER — SPIRONOLACTONE 25 MG PO TABS
25.0000 mg | ORAL_TABLET | Freq: Every day | ORAL | Status: DC
  Administered 2024-02-27 – 2024-02-29 (×3): 25 mg via ORAL
  Filled 2024-02-27 (×3): qty 1

## 2024-02-27 NOTE — Consult Note (Signed)
 Pharmacy Consult Note - Anticoagulation  Pharmacy Consult for warfarin Indication: CTEPH and atrial fibrillation  PATIENT MEASUREMENTS: Height: 5' 7 (170.2 cm) Weight: (!) 153.5 kg (338 lb 6.5 oz) IBW/kg (Calculated) : 66.1 HEPARIN  DW (KG): 104.5  VITAL SIGNS: Temp: 97.5 F (36.4 C) (06/30 0126) Temp Source: Oral (06/30 0126) BP: 126/71 (06/30 0700) Pulse Rate: 96 (06/30 0700)  Recent Labs    02/26/24 0543 02/27/24 0545  HGB 13.7  --   HCT 43.1  --   PLT 336  --   LABPROT 32.2* 34.3*  INR 3.0* 3.2*  CREATININE 2.09* 2.31*    Estimated Creatinine Clearance: 52.3 mL/min (A) (by C-G formula based on SCr of 2.31 mg/dL (H)).  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  Regular the workup has not been forthcoming) previously followed by Dr. Darcy at [facility]   Hepatic steatosis    Hypertension    Morbid obesity with BMI of 50.0-59.9, adult (HCC)    Oxygen deficiency    Peripheral vascular disease (HCC)    Presence of IVC filter    Has 2 IVC filters with significant thrombus burden superior to the filter.   Pulmonary embolus (HCC) 12/2016   Initially just treated with anticoagulation, but in February 2023 in April 2024 treated with EKOS thrombectomy for submassive PE.   Ventricular trigeminy    Weakness of both lower limbs     ASSESSMENT: 53 y.o. 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 Patient is on chronic anticoagulation per chart review. Has  been on Xarelto  in the past but is currently on warfarin with INR goal 2.5-3.5. Pharmacy has been consulted to manage warfarin while patient is admitted. INR currently subtherapeutic; no known missed doses however, per wife, medication adherence overall is question. Patient is receiving enoxaparin  until INR is therapeutic.  Pertinent medications: Warfarin 6mg  on Tuesday, Thursday, Sunday Warfarin 5mg  all other days Has 5mg  and 1mg  tablets at home Last dose 6/19 at bedtime Total weekly warfarin = 38mg   Goal(s) of therapy: INR 2.5 - 3.5 Monitor platelets by anticoagulation protocol: Yes   Date:  INR: Dose: 6/20 1.6 8 mg 6/21 2.0 8 mg 6/22 2.2 6 mg  6/23 2.0 8 mg  6/24  1.9 10 mg 6/25 2.1 10 mg 6/26 2.5 8 mg 6/27 2.6 8 mg 6/28 2.9 6 mg  6/29 3 5  mg   PLAN: - INR continues to trend up(likely from effects of higher doses from a couple of days ago) - Will give home dose of warfarin 5 mg dose today. - Daily INR and CBC at least every 3 days.   Raziyah Vanvleck A Raynell Upton, PharmD Clinical Pharmacist 02/27/2024 9:50 AM

## 2024-02-27 NOTE — Plan of Care (Signed)
  Problem: Education: Goal: Knowledge of General Education information will improve Description: Including pain rating scale, medication(s)/side effects and non-pharmacologic comfort measures 02/27/2024 1921 by Tia Adrien CROME, RN Outcome: Progressing 02/27/2024 1920 by Tia Adrien CROME, RN Outcome: Progressing 02/27/2024 1919 by Tia Adrien CROME, RN Outcome: Progressing   Problem: Health Behavior/Discharge Planning: Goal: Ability to manage health-related needs will improve 02/27/2024 1921 by Tia Adrien CROME, RN Outcome: Progressing 02/27/2024 1920 by Tia Adrien CROME, RN Outcome: Progressing   Problem: Nutrition: Goal: Adequate nutrition will be maintained 02/27/2024 1921 by Tia Adrien CROME, RN Outcome: Progressing 02/27/2024 1920 by Tia Adrien CROME, RN Outcome: Progressing 02/27/2024 1919 by Tia Adrien CROME, RN Outcome: Progressing   Problem: Elimination: Goal: Will not experience complications related to bowel motility 02/27/2024 1921 by Tia Adrien CROME, RN Outcome: Progressing 02/27/2024 1920 by Tia Adrien CROME, RN Outcome: Progressing 02/27/2024 1919 by Tia Adrien CROME, RN Outcome: Progressing Goal: Will not experience complications related to urinary retention 02/27/2024 1921 by Tia Adrien CROME, RN Outcome: Progressing 02/27/2024 1920 by Tia Adrien CROME, RN Outcome: Progressing 02/27/2024 1919 by Tia Adrien CROME, RN Outcome: Progressing   Problem: Pain Managment: Goal: General experience of comfort will improve and/or be controlled 02/27/2024 1921 by Tia Adrien CROME, RN Outcome: Progressing 02/27/2024 1920 by Tia Adrien CROME, RN Outcome: Progressing 02/27/2024 1919 by Tia Adrien CROME, RN Outcome: Progressing   Problem: Safety: Goal: Ability to remain free from injury will improve 02/27/2024 1920 by Tia Adrien CROME, RN Outcome: Progressing 02/27/2024 1919 by Tia Adrien CROME, RN Outcome: Progressing   Problem: Skin  Integrity: Goal: Risk for impaired skin integrity will decrease 02/27/2024 1920 by Tia Adrien CROME, RN Outcome: Progressing 02/27/2024 1919 by Tia Adrien CROME, RN Outcome: Progressing   Problem: Education: Goal: Ability to demonstrate management of disease process will improve Outcome: Progressing

## 2024-02-27 NOTE — Progress Notes (Signed)
 [facility]  - [place] at [facility]   PATIENT NAME: Ryan [name]    MR#:  [id number]  DATE OF BIRTH:  11/08/1970  SUBJECTIVE:  no family at bedside resting quietly. Continued po diuretics and IV milrinone  gtt currently down to 11 L high flow nasal cannula oxygen. Worked with PT-- gets very exhausted easily. Low-grade fever today    VITALS:  Blood pressure 132/88, pulse 98, temperature (!) 100.4 F (38 C), resp. rate 20, height 5' 7 (1.702 m), weight (!) 153.5 kg, SpO2 95%.  PHYSICAL EXAMINATION:   GENERAL:  53 y.o.-year-old patient with no acute distress. Morbid obesity LUNGS: distant breath sounds bilaterally Right tunnel IJ CARDIOVASCULAR: S1, S2 normal. No murmur   ABDOMEN: Soft, nontender, nondistended. Abdominal obesity  EXTREMITIES: + edema b/l.    NEUROLOGIC: nonfocal  patient is alert and awake  LABORATORY PANEL:  CBC Recent Labs  Lab 02/27/24 1330  WBC 10.1  HGB 13.0  HCT 41.5  PLT 341    Chemistries  Recent Labs  Lab 02/26/24 0543 02/27/24 0544 02/27/24 0545  NA 135  --  135  K 4.3  --  4.5  CL 96*  --  95*  CO2 29  --  28  GLUCOSE 118*  --  122*  BUN 30*  --  33*  CREATININE 2.09*  --  2.31*  CALCIUM  9.3  --  9.2  MG 2.1  --   --   AST  --  42*  --   ALT  --  46*  --   ALKPHOS  --  49  --   BILITOT  --  1.2  --       Assessment and Plan  53 y.o. 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 In the ER, CXR showed malpositioned CVC but no infiltrates. CTA chest showed chronic PE, no new findings, no pulmonary edema.   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 daily --Continue milrinone  IV infusion --Continue Lovenox  and Coumadin , pharmacy consulted for dosing and INR monitoring--now on coumadin . INR 2.6 --[facility] following --Discussed with ID, Bld cx most likely contamination -IR consulted, 25cm DL tunneled PICC line exchange was done on 6/23 --CXR: Increased left perihilar interstitial and patchy opacities, which may represent pulmonary edema or infection. Similar cardiomegaly. --PCCM consulted, overall long term poor prognosis, palliative care consulted.  Febrile illness -- CBC within normal limit. PRN Tylenol . -- Blood cultures ordered    Acute gastroenteritis secondary to C. difficile and E. Coli --C. difficile antigen and PCR toxin positive --GI pathogen positive for E. Coli --Patient presented with fever, diarrhea and nausea vomiting. --DC'd broad-spectrum antibiotics vancomycin  and cefepime  --Discussed with ID, recommended to start vancomycin  orally Q6 hourly for 10 days --No need to treat E. Coli --Started probiotics   # AKI on CKD IIIa --Baseline sCr 1.4 --sCr 2.0 on admission, sCr 1.96 --1.48--1.62--2.09 --US  renal: Ruled out hydronephrosis, bladder distention, could not rule out urinary retention, PVR was not done -Avoid nephrology  medication, your renal dose medication   # Metabolic acidosis due to renal failure --Bicarb IV 150 mEq given.  Acidosis resolved, bicarbonate is now within normal range.   # Paroxysmal A-fib --Currently patient is in sinus --Continue Coumadin    # HTN, HLD, dHF 6/25 resumed Torsemide  60 mg p.o. daily,  6/24 started Aldactone  50 mg p.o. daily S/p Lasix  as above   # Type 2 diabetes A1c <7% recently.  Diet controlled - SS correction insulin    # Hypokalemia, potassium repleted. Monitor BMP daily   # Hyponatremia Mild -  Diuresis as above and trend    # Iron  deficiency, transferrin saturation 5%, started Venofer  3 mg IV daily x 3 doses followed by oral supplement at discharge.   # Vitamin D  deficiency: started vitamin D  50,000 units p.o. weekly, follow with PCP to repeat vitamin D  level after 3 to 6 months.   # Vitamin B12 deficiency: Started vitamin B12 1000 mcg IM injection daily during hospital stay, followed by oral supplement.  Follow-up PCP to repeat vitamin B12 level after 3 to 6 months.   # Obstructive sleep apnea, continue CPAP   # Obesity class III, morbid obesity Body mass index is 53.11 kg/m.  Interventions: Calorie restricted diet and daily exercise advised to lose body weight.  Lifestyle modification discussed.     Diet: Heart healthy/carb modified diet DVT Prophylaxis: Subcutaneous Lovenox  and Coumadin  Family communication :none today Consults :[facility] cardiology CODE STATUS: Full DVT Prophylaxis :warfarin Level of care: Stepdown Status is: Inpatient Remains inpatient appropriate because: remains on milrinone  gtt and 10 L HFNC  Patient is at a high risk for cardiorespiratory arrest. Overall long-term prognosis poor    TOTAL TIME TAKING CARE OF THIS PATIENT: 35 minutes.  >50% time spent on counselling and coordination of care  Note: This dictation was prepared with Dragon dictation along with smaller phrase technology. Any transcriptional errors that result from this process are unintentional.  Leita Blanch M.D    [facility]   CC: Primary care physician; Marylynn Verneita CROME, MD

## 2024-02-27 NOTE — Progress Notes (Signed)
 OT Cancellation Note  Patient Details Name: Ryan [name] MRN: [id number] DOB: 1971/03/11   Cancelled Treatment:    Reason Eval/Treat Not Completed: Other (comment), Chart reviewed to date and treatment attempted x2 this date. First attempt at 1145am, pt reporting he had just returned to bed from recliner and was too tired to do anything. 2nd attempt at 1345 with nurse reporting pt with new fever, chills and feeling bad wishing for OT to hold at on tx this date until further testing/bloodwork is done. OT to re-attempt as pt is medically stable another date.  [name] [name] 02/27/2024, 1:52 PM

## 2024-02-27 NOTE — Plan of Care (Signed)

## 2024-02-27 NOTE — Plan of Care (Signed)
  Problem: Education: Goal: Knowledge of General Education information will improve Description: Including pain rating scale, medication(s)/side effects and non-pharmacologic comfort measures 02/27/2024 1920 by Tia Adrien CROME, RN Outcome: Progressing 02/27/2024 1919 by Tia Adrien CROME, RN Outcome: Progressing   Problem: Health Behavior/Discharge Planning: Goal: Ability to manage health-related needs will improve Outcome: Progressing   Problem: Nutrition: Goal: Adequate nutrition will be maintained 02/27/2024 1920 by Tia Adrien CROME, RN Outcome: Progressing 02/27/2024 1919 by Tia Adrien CROME, RN Outcome: Progressing   Problem: Elimination: Goal: Will not experience complications related to bowel motility 02/27/2024 1920 by Tia Adrien CROME, RN Outcome: Progressing 02/27/2024 1919 by Tia Adrien CROME, RN Outcome: Progressing Goal: Will not experience complications related to urinary retention 02/27/2024 1920 by Tia Adrien CROME, RN Outcome: Progressing 02/27/2024 1919 by Tia Adrien CROME, RN Outcome: Progressing   Problem: Pain Managment: Goal: General experience of comfort will improve and/or be controlled 02/27/2024 1920 by Tia Adrien CROME, RN Outcome: Progressing 02/27/2024 1919 by Tia Adrien CROME, RN Outcome: Progressing   Problem: Safety: Goal: Ability to remain free from injury will improve 02/27/2024 1920 by Tia Adrien CROME, RN Outcome: Progressing 02/27/2024 1919 by Tia Adrien CROME, RN Outcome: Progressing   Problem: Skin Integrity: Goal: Risk for impaired skin integrity will decrease 02/27/2024 1920 by Tia Adrien CROME, RN Outcome: Progressing 02/27/2024 1919 by Tia Adrien CROME, RN Outcome: Progressing   Problem: Education: Goal: Ability to demonstrate management of disease process will improve Outcome: Progressing

## 2024-02-27 NOTE — Progress Notes (Signed)
 PHARMACY CONSULT NOTE  Pharmacy Consult for Electrolyte Monitoring and Replacement   Recent Labs: Potassium (mmol/L)  Date Value  02/27/2024 4.5  12/28/2014 3.8   Magnesium  (mg/dL)  Date Value  93/70/7974 2.1  12/28/2014 2.7 (H)   Calcium  (mg/dL)  Date Value  93/69/7974 9.2   Calcium , Total (mg/dL)  Date Value  95/69/7983 8.1 (L)   Albumin  (g/dL)  Date Value  93/72/7974 3.5  10/24/2023 3.9  12/23/2014 3.9   Phosphorus (mg/dL)  Date Value  93/70/7974 4.2  12/28/2014 6.1 (H)   Sodium (mmol/L)  Date Value  02/27/2024 135  10/24/2023 143  12/28/2014 130 (L)     Assessment: 53 y.o. 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 Pharmacy is asked to follow and replace electrolytes while in CCU  Pertinent Medications: spironolactone  50 mg po daily, torsemide  60 mg po daily  Goal of Therapy:  Potassium 4.0 - 5.1 mmol/L Magnesium  2.0 - 2.4 mg/dL All Other Electrolytes WNL   Plan:  No electrolyte replacement indicated F/u labs tomorrow AM as already ordered  [facility] Ryan [name] 02/27/2024 7:45 AM

## 2024-02-27 NOTE — Progress Notes (Signed)
 Physical Therapy Treatment Patient Details Name: Ryan [name] MRN: [id number] DOB: 10-Oct-1970 Today's Date: 02/27/2024   History of Present Illness 53 y/o male presented to ED on 02/17/24 for worsening SOB and cough along with syncope. Admitted for suspected cor pulmonale and pulmonary HTN. 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  OOB to [facility] for bathing and BM.  Transferred to [facility].  Stood x 2 for self directed standing ex with and without walker support.  Pt does do well without RW but is helpful for energy conservation.  Overall progressing well with mobility and motivated to improve.  Increased rest periods this session due to increased standing time and activity.   If plan is discharge home, recommend the following: A little help with walking and/or transfers;A little help with bathing/dressing/bathroom;Assistance with cooking/housework;Assist for transportation;Help with stairs or ramp for entrance   Can travel by private vehicle        Equipment Recommendations       Recommendations for Other Services       Precautions / Restrictions Precautions Precautions: Fall Recall of Precautions/Restrictions: Intact Restrictions Weight Bearing Restrictions Per Provider Order: No     Mobility  Bed Mobility Overal bed mobility: Needs Assistance Bed Mobility: Supine to Sit     Supine to sit: Supervision     General bed mobility comments: supervision assist this date, increased time to complete transition Patient Response: Cooperative  Transfers Overall transfer level: Needs assistance Equipment used: Rolling walker (2 wheels)   Sit to Stand: Supervision, Contact guard assist           General transfer comment: assist for managing lines and leads    Ambulation/Gait Ambulation/Gait assistance: Contact guard assist Gait Distance (Feet): 5 Feet Assistive device: Rolling walker  (2 wheels)   Gait velocity: decreased     General Gait Details: uses RW occasionally during session for light support.   Stairs             Wheelchair Mobility     Tilt Bed Tilt Bed Patient Response: Cooperative  Modified Rankin (Stroke Patients Only)       Balance Overall balance assessment: Mild deficits observed, not formally tested                                          Communication Communication Communication: No apparent difficulties  Cognition Arousal: Alert Behavior During Therapy: WFL for tasks assessed/performed   PT - Cognitive impairments: No apparent impairments                         Following commands: Intact      Cueing Cueing Techniques: Verbal cues, Tactile cues  Exercises Other Exercises Other Exercises: standing AROM with walker self directed Other Exercises: bathing on BSC and in standing    General Comments        Pertinent Vitals/Pain Pain Assessment Pain Assessment: No/denies pain    Home Living                          Prior Function            PT Goals (current goals can now be found in the care plan section) Progress towards PT goals: Progressing  toward goals    Frequency    Min 2X/week      PT Plan      Co-evaluation              AM-PAC PT 6 Clicks Mobility   Outcome Measure  Help needed turning from your back to your side while in a flat bed without using bedrails?: None Help needed moving from lying on your back to sitting on the side of a flat bed without using bedrails?: None Help needed moving to and from a bed to a chair (including a wheelchair)?: None Help needed standing up from a chair using your arms (e.g., wheelchair or bedside chair)?: None Help needed to walk in hospital room?: A Little Help needed climbing 3-5 steps with a railing? : A Little 6 Click Score: 22    End of Session Equipment Utilized During Treatment: Oxygen   Patient  left: in chair;with call bell/phone within reach Nurse Communication: Mobility status PT Visit Diagnosis: Unsteadiness on feet (R26.81);Muscle weakness (generalized) (M62.81);Other abnormalities of gait and mobility (R26.89)     Time: 9080-8991 PT Time Calculation (min) (ACUTE ONLY): 49 min  Charges:    $Therapeutic Exercise: 23-37 mins $Therapeutic Activity: 8-22 mins PT General Charges $$ ACUTE PT VISIT: 1 Visit                   Lauraine Gills, PTA 02/27/24, 10:12 AM

## 2024-02-27 NOTE — Progress Notes (Cosign Needed Addendum)
 Advanced Heart Failure Rounding Note  [facility] Cardiologist: Dr. Zenaida    Chief Complaint:  Hypoxia   Patient Profile  Patient is a chronically ill 53 year old male with extensive family history of clotting disorders. 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 Per chart review, has MTHFR gene mutation and was previously followed by Hematology (Dr. Darcy). There has been some concern for poor compliance with anticoagulation. Hypercoagulable workup has been unrevealing. 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  Multiple hospitalizations in the past several months.  9/24: admitted w/ recurrent PE s/p mechanical thrombectomy  10/24: readmitted w/ a/c respiratory failure. CTA showed persistent nonocclusive thrombus in the right main pulmonary artery as well as new small left upper lobe segmental PE  12/24: Readmitted in 12/24 with worsening dyspnea d/t acute on chronic RV failure. CTA with resolution of prior left-sided PE and stable chronic right-sided PE   Now readmitted w/ a/c hypoxic respiratory failure and a/c RV failure + acute C-diff infection. CTPE (-) for acute PE, chronic PE w/ evidence of CTEPH. Subtherapeutic INR at 1.6.    Pertinent Studies:   RHC + Pulmonary Angiography (Prior to this admission on Milrinone  0.25) 12/16/23:  RA 13, PA 110/36 (60), PCWP 12, CO/CI (TD) 5.15/1.99, TPG 48, PVR 9.3, PAPi 5.7  Selective pulmonary angiography with evidence of disease in the left PA, segmental and subsegmental in anteromedial basal territory, likely segmental disease in the right upper lobe. Images somewhat limited by camera acquisition and body habitus. Interval Hx/ Subjective:    Remains on home milrinone  0.25 mcg/kg/min.  CO-OX 59%. CVP 3. Scr trending up, 2.3 today. Weights have been variable.   Remains on 11L  HFNC. Feels okay. Able to tolerate ambulation with PT this am. New fever noted.    Objective:    Weight Range: (!) 153.5 kg Body mass index is 53 kg/m.   Vital Signs:   Temp:  [97.5 F (36.4 C)-100.9 F (38.3 C)] 100.4 F (38 C) (06/30 1307) Pulse Rate:  [93-127] 98 (06/30 1215) Resp:  [0-30] 20 (06/30 1215) BP: (97-137)/(45-88) 132/88 (06/30 1200) SpO2:  [87 %-97 %] 95 % (06/30 1215) Weight:  [153.5 kg] 153.5 kg (06/30 0500) Last BM Date : 02/26/24  Weight change: Filed Weights   02/25/24 0500 02/26/24 0700 02/27/24 0500  Weight: (!) 154.1 kg (!) 148.6 kg (!) 153.5 kg   Intake/Output:  Intake/Output Summary (Last 24 hours) at 02/27/2024 1323 Last data filed at 02/27/2024 1200 Gross per 24 hour  Intake 1106.98 ml  Output 1250 ml  Net -143.02 ml    Physical Exam    General:  Chronically ill appearing Neck: JVP difficult  Cor: Regular rate & rhythm. No rubs, gallops or murmurs. + tunneled PICC R upper chest Lungs: clear Abdomen: obese, soft, nontender, nondistended.  Extremities: 1+ edema, chronic stasis changes Neuro: alert & orientedx3. Affect pleasant   Telemetry    SR 90s-100s  Labs    CBC Recent Labs    02/25/24 0526 02/26/24 0543  WBC 4.3 5.8  HGB 13.6 13.7  HCT 43.0 43.1  MCV 86.5 85.9  PLT 287 336   Basic Metabolic Panel Recent Labs    93/71/74 0525 02/26/24 0543 02/27/24 0545  NA 135 135 135  K 4.0 4.3 4.5  CL 96* 96* 95*  CO2 28 29 28   GLUCOSE 163* 118* 122*  BUN 26* 30* 33*  CREATININE 1.80* 2.09* 2.31*  CALCIUM  9.2 9.3 9.2  MG 1.9 2.1  --   PHOS  --  4.2  --    BNP (last 3 results) Recent Labs    10/24/23 1410 02/17/24 1037 02/21/24 0408  BNP 84.4 424.2* 141.2*   Medications:    Scheduled Medications:  Chlorhexidine  Gluconate Cloth  6 each Topical Daily   vitamin B-12  1,000 mcg Oral Daily   insulin  aspart  0-5 Units Subcutaneous QHS   insulin  aspart  0-9 Units Subcutaneous TID WC   macitentan  10 mg Oral Daily    multivitamin with minerals  1 tablet Oral Daily   pantoprazole   40 mg Oral Daily   Riociguat   2.5 mg Oral TID   saccharomyces boulardii  250 mg Oral BID   spironolactone   25 mg Oral Daily   vancomycin   125 mg Oral QID   Vitamin D  (Ergocalciferol )  50,000 Units Oral Q7 days   warfarin  5 mg Oral ONCE-1600   Warfarin - Pharmacist Dosing Inpatient   Does not apply q1600   Infusions:  milrinone  0.25 mcg/kg/min (02/27/24 1200)   PRN Medications: acetaminophen  **OR** acetaminophen , albuterol , ondansetron  **OR** ondansetron  (ZOFRAN ) IV, oxyCODONE   Assessment/Plan   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 Initial LA 2.0>>1.1>>1.3.  - Echo 6/24: EF >55%, flattened septum in systole/diastole, mod reduced RV - milrinone  initially increased to 0.375 and later dropped to home dose 0.25 mcg/kg/min. CO-OX stable 59%.  - Volume status is low, Scr worse up to 2.3. Stop po Torsemide , decrease spiro to 25 mg daily - continue macitentan 10 mg daily - Continue Adempas  2.5 mg tid - INR 3.2 (Goal 2.5-3.5). PharmD dosing coumadin   - Patient has 2 IVC filters that are clotted with no IVC return, has been evaluated at [facility] for removal and felt not to be a candidate. 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 Has been On Adempas  and home milrinone  for RV support. Macitentan added this admit. - Have been unable to wean HFNC. May need to consider placement of PA catheter with initiation of IV prostanoids and transfer to [facility] for ongoing IV  therapy for PAH. Will tentatively plan for RHC tomorrow afternoon. However, will have to postpone if evidence of infection (see below).  2. Recurrent DVT/PE - Chronic, stable PE on CTA this admit.  - INR 3.2 - Warfarin per PharmD   3. Atrial fibrillation/flutter - Currently in SR - Monitor with use of milrinone  - on Coumadin     4. AKI on CKD IIIa - b/l SCr ~1.5-1.7 - Scr trending up last few days, 2.3 this am. Holding diuretics and reduced spiro as above.  5. C-Diff Infection  - GI panel + for Ecoli  - Initial BCx + for Staph Epi (likely contaminant). Repeat BCx NGTD  - on Vanc per ID - on saccharomyces boulardii bid   6. Hypokalemia/Hypomagnesemia   - Resoled.  - Supp as needed  7.  Ventricular Bigeminy - in setting of low K/Mg + milrinone   - resolved  8. Acute on Chronic Hypoxic Respiratory Failure - Remains on 11L HFNC - Would suspect hypoxia to improve with diuresis. W/o evidence of acute PNA - PCCM has seen. Pt felt to be high risk for cardiac arrest if intubation/mechanical ventilation pursued given RV failure and severe PH.   9. Fever - New today. T max 100.33F.  - check CBC, procalcitonin and blood cultures - ? If contributing to hypoxia  CRITICAL CARE Performed by: COLLETTA SHAVER N   Total critical care time: 18 minutes  Critical care time was exclusive of separately billable procedures and treating other patients.  Critical care was necessary to treat or prevent imminent or life-threatening deterioration.  Critical care was time spent personally by me on the following activities: development of treatment plan with patient and/or surrogate as well as nursing, discussions with consultants, evaluation of patient's response to treatment, examination of patient, obtaining history from patient or surrogate, ordering and performing treatments and interventions, ordering and review of laboratory studies, ordering and review of radiographic studies, pulse oximetry and  re-evaluation of patient's condition.    Length of Stay: [address] SHAVER SAILOR, PA-C  02/27/2024, 1:23 PM  Advanced Heart Failure Team Pager [phone] (M-F; 7a - 5p)  Please contact [facility] for night-coverage after hours (5p -7a ) and weekends on [email or web address]

## 2024-02-27 NOTE — Plan of Care (Signed)
  Problem: Education: Goal: Knowledge of General Education information will improve Description: Including pain rating scale, medication(s)/side effects and non-pharmacologic comfort measures Outcome: Progressing   Problem: Nutrition: Goal: Adequate nutrition will be maintained Outcome: Progressing   Problem: Elimination: Goal: Will not experience complications related to bowel motility Outcome: Progressing Goal: Will not experience complications related to urinary retention Outcome: Progressing   Problem: Pain Managment: Goal: General experience of comfort will improve and/or be controlled Outcome: Progressing   Problem: Safety: Goal: Ability to remain free from injury will improve Outcome: Progressing   Problem: Skin Integrity: Goal: Risk for impaired skin integrity will decrease Outcome: Progressing   Problem: Education: Goal: Ability to demonstrate management of disease process will improve Outcome: Progressing

## 2024-02-28 ENCOUNTER — Encounter
Admission: EM | Disposition: A | Discharge status: Other Acute Inpatient Hospital | Payer: Self-pay | Source: Home / Self Care | Attending: Student

## 2024-02-28 DIAGNOSIS — I2724 Chronic thromboembolic pulmonary hypertension: Secondary | ICD-10-CM | POA: Diagnosis not present

## 2024-02-28 DIAGNOSIS — I5032 Chronic diastolic (congestive) heart failure: Secondary | ICD-10-CM | POA: Diagnosis not present

## 2024-02-28 DIAGNOSIS — I50810 Right heart failure, unspecified: Secondary | ICD-10-CM | POA: Diagnosis not present

## 2024-02-28 DIAGNOSIS — E1122 Type 2 diabetes mellitus with diabetic chronic kidney disease: Secondary | ICD-10-CM | POA: Diagnosis not present

## 2024-02-28 DIAGNOSIS — I272 Pulmonary hypertension, unspecified: Secondary | ICD-10-CM | POA: Diagnosis not present

## 2024-02-28 DIAGNOSIS — I2781 Cor pulmonale (chronic): Secondary | ICD-10-CM | POA: Diagnosis not present

## 2024-02-28 HISTORY — PX: RIGHT HEART CATH: CATH118263

## 2024-02-28 LAB — POCT I-STAT EG7
Acid-Base Excess: 5 mmol/L — ABNORMAL HIGH (ref 0.0–2.0)
Acid-Base Excess: 5 mmol/L — ABNORMAL HIGH (ref 0.0–2.0)
Bicarbonate: 29.6 mmol/L — ABNORMAL HIGH (ref 20.0–28.0)
Bicarbonate: 30.2 mmol/L — ABNORMAL HIGH (ref 20.0–28.0)
Calcium, Ion: 1.18 mmol/L (ref 1.15–1.40)
Calcium, Ion: 1.2 mmol/L (ref 1.15–1.40)
HCT: 41 % (ref 39.0–52.0)
HCT: 42 % (ref 39.0–52.0)
Hemoglobin: 13.9 g/dL (ref 13.0–17.0)
Hemoglobin: 14.3 g/dL (ref 13.0–17.0)
O2 Saturation: 68 %
O2 Saturation: 70 %
Potassium: 4.4 mmol/L (ref 3.5–5.1)
Potassium: 4.5 mmol/L (ref 3.5–5.1)
Sodium: 135 mmol/L (ref 135–145)
Sodium: 136 mmol/L (ref 135–145)
TCO2: 31 mmol/L (ref 22–32)
TCO2: 32 mmol/L (ref 22–32)
pCO2, Ven: 43.4 mmHg — ABNORMAL LOW (ref 44–60)
pCO2, Ven: 44.4 mmHg (ref 44–60)
pH, Ven: 7.44 — ABNORMAL HIGH (ref 7.25–7.43)
pH, Ven: 7.442 — ABNORMAL HIGH (ref 7.25–7.43)
pO2, Ven: 35 mmHg (ref 32–45)
pO2, Ven: 36 mmHg (ref 32–45)

## 2024-02-28 LAB — BASIC METABOLIC PANEL WITH GFR
Anion gap: 11 (ref 5–15)
BUN: 35 mg/dL — ABNORMAL HIGH (ref 6–20)
CO2: 30 mmol/L (ref 22–32)
Calcium: 9.2 mg/dL (ref 8.9–10.3)
Chloride: 96 mmol/L — ABNORMAL LOW (ref 98–111)
Creatinine, Ser: 2.15 mg/dL — ABNORMAL HIGH (ref 0.61–1.24)
GFR, Estimated: 36 mL/min — ABNORMAL LOW (ref >60)
Glucose, Bld: 121 mg/dL — ABNORMAL HIGH (ref 70–99)
Potassium: 4.5 mmol/L (ref 3.5–5.1)
Sodium: 137 mmol/L (ref 135–145)

## 2024-02-28 LAB — GLUCOSE, CAPILLARY
Glucose-Capillary: 111 mg/dL — ABNORMAL HIGH (ref 70–99)
Glucose-Capillary: 127 mg/dL — ABNORMAL HIGH (ref 70–99)
Glucose-Capillary: 110 mg/dL — ABNORMAL HIGH (ref 70–99)
Glucose-Capillary: 141 mg/dL — ABNORMAL HIGH (ref 70–99)

## 2024-02-28 LAB — COOXEMETRY PANEL
Carboxyhemoglobin: 1.6 % — ABNORMAL HIGH (ref 0.5–1.5)
Methemoglobin: 0.7 % (ref 0.0–1.5)
O2 Saturation: 71 %
Total hemoglobin: 11.6 g/dL — ABNORMAL LOW (ref 12.0–16.0)
Total oxygen content: 69.3 %

## 2024-02-28 LAB — PROTIME-INR
INR: 3 — ABNORMAL HIGH (ref 0.8–1.2)
Prothrombin Time: 32.5 s — ABNORMAL HIGH (ref 11.4–15.2)

## 2024-02-28 LAB — MAGNESIUM: Magnesium: 2.2 mg/dL (ref 1.7–2.4)

## 2024-02-28 LAB — PHOSPHORUS: Phosphorus: 3.6 mg/dL (ref 2.5–4.6)

## 2024-02-28 SURGERY — RIGHT HEART CATH
Anesthesia: Moderate Sedation

## 2024-02-28 MED ORDER — ASPIRIN 81 MG PO CHEW: 81.0000 mg | CHEWABLE_TABLET | ORAL | Status: DC

## 2024-02-28 MED ORDER — WARFARIN SODIUM 5 MG PO TABS
5.0000 mg | ORAL_TABLET | Freq: Once | ORAL | Status: AC
  Administered 2024-02-28: 5 mg via ORAL
  Filled 2024-02-28: qty 1

## 2024-02-28 MED ORDER — ORAL CARE MOUTH RINSE: 15.0000 mL | OROMUCOSAL | Status: DC | PRN

## 2024-02-28 MED ORDER — LIDOCAINE HCL (PF) 1 % IJ SOLN
INTRAMUSCULAR | Status: DC | PRN
  Administered 2024-02-28: 10 mL

## 2024-02-28 MED ORDER — SODIUM CHLORIDE 0.9 % IV SOLN: INTRAVENOUS | Status: DC

## 2024-02-28 MED ORDER — LIDOCAINE HCL 1 % IJ SOLN
INTRAMUSCULAR | Status: AC
Start: 2024-02-28 — End: 2024-02-28
  Filled 2024-02-28: qty 20

## 2024-02-28 MED ORDER — HEPARIN (PORCINE) IN NACL 1000-0.9 UT/500ML-% IV SOLN
INTRAVENOUS | Status: DC | PRN
  Administered 2024-02-28 (×2): 500 mL

## 2024-02-28 SURGICAL SUPPLY — 13 items
CATH SWAN GANZ 7F STRAIGHT (CATHETERS) IMPLANT
COVER EZ STRL 42X30 (DRAPES) IMPLANT
DRAPE BRACHIAL (DRAPES) IMPLANT
GLIDESHEATH SLENDER 7FR .021G (SHEATH) IMPLANT
KIT MICROPUNCTURE VSI 5F STIFF (SHEATH) IMPLANT
NDL ENTRY 21GA 7CM ECHOTIP (NEEDLE) IMPLANT
NEEDLE ENTRY 21GA 7CM ECHOTIP (NEEDLE) ×1 IMPLANT
PACK CARDIAC CATH (CUSTOM PROCEDURE TRAY) ×1 IMPLANT
SET ATX-X65L (MISCELLANEOUS) IMPLANT
SHEATH AVANTI 7FRX11 (SHEATH) IMPLANT
STATION PROTECTION PRESSURIZED (MISCELLANEOUS) IMPLANT
WIRE J 3MM .035X145CM (WIRE) IMPLANT
WIRE NITINOL .018 (WIRE) IMPLANT

## 2024-02-28 NOTE — Interval H&P Note (Signed)
 History and Physical Interval Note:  02/28/2024 3:00 PM  Ryan [name]  has presented today for surgery, with the diagnosis of Pulmonary hypertension.  The various methods of treatment have been discussed with the patient and family. After consideration of risks, benefits and other options for treatment, the patient has consented to  Procedure(s): RIGHT HEART CATH (N/A) as a surgical intervention.  The patient's history has been reviewed, patient examined, no change in status, stable for surgery.  I have reviewed the patient's chart and labs.  Questions were answered to the patient's satisfaction.     [name]

## 2024-02-28 NOTE — Progress Notes (Signed)
 PHARMACY CONSULT NOTE  Pharmacy Consult for Electrolyte Monitoring and Replacement   Recent Labs: Potassium (mmol/L)  Date Value  02/28/2024 4.5  12/28/2014 3.8   Magnesium  (mg/dL)  Date Value  92/98/7974 2.2  12/28/2014 2.7 (H)   Calcium  (mg/dL)  Date Value  92/98/7974 9.2   Calcium , Total (mg/dL)  Date Value  95/69/7983 8.1 (L)   Albumin  (g/dL)  Date Value  93/69/7974 3.5  10/24/2023 3.9  12/23/2014 3.9   Phosphorus (mg/dL)  Date Value  92/98/7974 3.6  12/28/2014 6.1 (H)   Sodium (mmol/L)  Date Value  02/28/2024 137  10/24/2023 143  12/28/2014 130 (L)     Assessment: 53 y.o. 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 Pharmacy is asked to follow and replace electrolytes while in CCU  Pertinent Medications: spironolactone  50 mg po daily, torsemide  60 mg po daily  Goal of Therapy:  Potassium 4.0 - 5.1 mmol/L Magnesium  2.0 - 2.4 mg/dL All Other Electrolytes WNL   Plan:  No electrolyte replacement indicated F/u labs tomorrow AM as already ordered  [facility] 02/28/2024 9:19 AM

## 2024-02-28 NOTE — Consult Note (Signed)
 Pharmacy Consult Note - Anticoagulation  Pharmacy Consult for warfarin Indication: CTEPH and atrial fibrillation  PATIENT MEASUREMENTS: Height: 5' 7 (170.2 cm) Weight: (!) 160.8 kg (354 lb 8 oz) IBW/kg (Calculated) : 66.1 HEPARIN  DW (KG): 104.5  VITAL SIGNS: Temp: 98 F (36.7 C) (07/01 0700) Temp Source: Oral (07/01 0400) BP: 118/73 (07/01 0800) Pulse Rate: 100 (07/01 0800)  Recent Labs    02/27/24 1330 02/28/24 0437  HGB 13.0  --   HCT 41.5  --   PLT 341  --   LABPROT  --  32.5*  INR  --  3.0*  CREATININE  --  2.15*    Estimated Creatinine Clearance: 57.8 mL/min (A) (by C-G formula based on SCr of 2.15 mg/dL (H)).  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  Regular the workup has not been forthcoming) previously followed by Dr. Darcy at [facility]   Hepatic steatosis    Hypertension    Morbid obesity with BMI of 50.0-59.9, adult (HCC)    Oxygen deficiency    Peripheral vascular disease (HCC)    Presence of IVC filter    Has 2 IVC filters with significant thrombus burden superior to the filter.   Pulmonary embolus (HCC) 12/2016   Initially just treated with anticoagulation, but in February 2023 in April 2024 treated with EKOS thrombectomy for submassive PE.   Ventricular trigeminy    Weakness of both lower limbs     ASSESSMENT: 53 y.o. 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 Patient is on chronic anticoagulation per chart review. Has been on  Xarelto  in the past but is currently on warfarin with INR goal 2.5-3.5. Pharmacy has been consulted to manage warfarin while patient is admitted. INR currently subtherapeutic; no known missed doses however, per wife, medication adherence overall is question. Patient is receiving enoxaparin  until INR is therapeutic.  Pertinent medications: Warfarin 6mg  on Tuesday, Thursday, Sunday Warfarin 5mg  all other days Has 5mg  and 1mg  tablets at home Last dose 6/19 at bedtime Total weekly warfarin = 38mg   Goal(s) of therapy: INR 2.5 - 3.5 Monitor platelets by anticoagulation protocol: Yes   Date:  INR: Dose: 6/20 1.6 8 mg 6/21 2.0 8 mg 6/22 2.2 6 mg  6/23 2.0 8 mg  6/24  1.9 10 mg 6/25 2.1 10 mg 6/26 2.5 8 mg 6/27 2.6 8 mg 6/28 2.9 6 mg  6/29 3.0 5 mg 6/30 3.2 5 mg   PLAN: - INR looks to have stabilized - Will continue home dose of warfarin(5 mg today) - Daily INR and CBC at least every 3 days.   Ryan [name] A Ryan [name], PharmD Clinical Pharmacist 02/28/2024 9:20 AM

## 2024-02-28 NOTE — Progress Notes (Signed)
 Advanced Heart Failure Rounding Note  [facility] Cardiologist: Dr. Zenaida    Chief Complaint:  Hypoxia   Patient Profile  Patient is a chronically ill 53 year old male with extensive family history of clotting disorders. 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 Per chart review, has MTHFR gene mutation and was previously followed by Hematology (Dr. Darcy). There has been some concern for poor compliance with anticoagulation. Hypercoagulable workup has been unrevealing. 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  Multiple hospitalizations in the past several months.  9/24: admitted w/ recurrent PE s/p mechanical thrombectomy  10/24: readmitted w/ a/c respiratory failure. CTA showed persistent nonocclusive thrombus in the right main pulmonary artery as well as new small left upper lobe segmental PE  12/24: Readmitted in 12/24 with worsening dyspnea d/t acute on chronic RV failure. CTA with resolution of prior left-sided PE and stable chronic right-sided PE   Now readmitted w/ a/c hypoxic respiratory failure and a/c RV failure + acute C-diff infection. CTPE (-) for acute PE, chronic PE w/ evidence of CTEPH. Subtherapeutic INR at 1.6.    Pertinent Studies:   RHC + Pulmonary Angiography (Prior to this admission on Milrinone  0.25) 12/16/23:  RA 13, PA 110/36 (60), PCWP 12, CO/CI (TD) 5.15/1.99, TPG 48, PVR 9.3, PAPi 5.7  Selective pulmonary angiography with evidence of disease in the left PA, segmental and subsegmental in anteromedial basal territory, likely segmental disease in the right upper lobe. Images somewhat limited by camera acquisition and body habitus.  Interval Hx/ Subjective:    Fevers resolved. Procalcitonin yesterday 0.13 BC X 2 pending.  CO-OX 71% on 0.25 milrinone . CVP 6.   Remains on 11L HFNC, O2 sat 88%  with good pleth waveform.   Going for RHC today.  Objective:    Weight Range: (!) 160.8 kg Body mass index is 55.52 kg/m.   Vital Signs:   Temp:  [97.3 F (36.3 C)-100.9 F (38.3 C)] 98 F (36.7 C) (07/01 0700) Pulse Rate:  [93-127] 98 (07/01 0700) Resp:  [11-30] 18 (07/01 0700) BP: (98-132)/(45-88) 114/63 (07/01 0700) SpO2:  [87 %-97 %] 94 % (07/01 0700) Weight:  [160.8 kg] 160.8 kg (07/01 0500) Last BM Date : 02/27/24  Weight change: Filed Weights   02/26/24 0700 02/27/24 0500 02/28/24 0500  Weight: (!) 148.6 kg (!) 153.5 kg (!) 160.8 kg   Intake/Output:  Intake/Output Summary (Last 24 hours) at 02/28/2024 0722 Last data filed at 02/28/2024 0719 Gross per 24 hour  Intake 748.46 ml  Output 700 ml  Net 48.46 ml    Physical Exam    General:  Chornically ill appearing Neck: JVP 6-7 cm Cor: Regular rate & rhythm, tachy. No rubs, gallops or murmurs. Lungs: breathing nonlabored Abdomen: obese, soft, nontender, nondistended.  Extremities: 1+ edema, chronic stasis changes Neuro: alert & orientedx3. Affect pleasant    Telemetry    ST 100s  Labs    CBC Recent Labs    02/26/24 0543 02/27/24 1330  WBC 5.8 10.1  NEUTROABS  --  7.6  HGB 13.7 13.0  HCT 43.1 41.5  MCV 85.9 85.2  PLT 336 341   Basic Metabolic Panel Recent Labs    93/70/74 0543 02/27/24 0545 02/28/24 0437  NA 135 135 137  K 4.3 4.5 4.5  CL 96* 95* 96*  CO2 29 28 30   GLUCOSE 118* 122* 121*  BUN  30* 33* 35*  CREATININE 2.09* 2.31* 2.15*  CALCIUM  9.3 9.2 9.2  MG 2.1  --  2.2  PHOS 4.2  --  3.6   BNP (last 3 results) Recent Labs    10/24/23 1410 02/17/24 1037 02/21/24 0408  BNP 84.4 424.2* 141.2*   Medications:    Scheduled Medications:  Chlorhexidine  Gluconate Cloth  6 each Topical Daily   vitamin B-12  1,000 mcg Oral Daily   insulin  aspart  0-5 Units Subcutaneous QHS   insulin  aspart  0-9 Units Subcutaneous TID WC   macitentan  10 mg Oral Daily   multivitamin with minerals   1 tablet Oral Daily   pantoprazole   40 mg Oral Daily   Riociguat   2.5 mg Oral TID   saccharomyces boulardii  250 mg Oral BID   spironolactone   25 mg Oral Daily   vancomycin   125 mg Oral QID   Vitamin D  (Ergocalciferol )  50,000 Units Oral Q7 days   Warfarin - Pharmacist Dosing Inpatient   Does not apply q1600   Infusions:  milrinone  0.25 mcg/kg/min (02/28/24 0719)   PRN Medications: acetaminophen  **OR** acetaminophen , albuterol , ondansetron  **OR** ondansetron  (ZOFRAN ) IV, mouth rinse, oxyCODONE   Assessment/Plan   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 Initial LA 2.0>>1.1>>1.3.  - Echo 6/24: EF >55%, flattened septum in systole/diastole, mod reduced RV - milrinone  initially increased to 0.375 and later dropped to home dose 0.25 mcg/kg/min.  - CO-OX stable 71% on 0.25 milrinone  (home dose) - CVP 6. Diuretics on hold after bump in Scr. Will determine if additional diuresis is needed after RHC - Continue spiro 25 mg daily - continue macitentan 10 mg daily - Continue Adempas  2.5 mg tid - INR 3.0 (Goal 2.5-3.5). PharmD dosing coumadin   - Patient has 2 IVC filters that are clotted with no IVC return, has been evaluated at [facility] for removal and felt not to be a candidate. 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 Has been On Adempas  and home milrinone  for RV support. Macitentan added this admit. - Have been unable to wean HFNC. May need to consider placement of PA catheter with initiation of IV prostanoids and transfer to  [facility] for ongoing IV therapy for PAH. Plan for RHC this afternoon.  2. Recurrent DVT/PE - Chronic, stable PE on CTA this admit.  - INR 3.0 today - Warfarin per PharmD   3. Paroxysmal atrial fibrillation/flutter - Currently sinus tach - Monitor with use of milrinone  - on Coumadin     4. AKI on CKD IIIa - b/l SCr ~1.5-1.7 - Scr > 2 last few days, down slightly to 2.15 this am. Holding diuretics and spiro reduced as above.   5. C-Diff Infection  - GI panel + for Ecoli  - Initial BCx + for Staph Epi (likely contaminant). Repeat BCx NGTD  - on Vanc per ID - on saccharomyces boulardii bid   6. Hypokalemia/Hypomagnesemia   - Resoled.  - Supp as needed  7. Ventricular Bigeminy - in  setting of low K/Mg + milrinone   - resolved  8. Acute on Chronic Hypoxic Respiratory Failure - Remains on 11L HFNC - Would suspect hypoxia to improve with diuresis. W/o evidence of acute PNA - PCCM has seen. Pt felt to be high risk for cardiac arrest if intubation/mechanical ventilation pursued given RV failure and severe PH.  - See above regarding management of pulmonary hypertension  9. Fever - Noted 06/30. Afebrile today - No leukocytosis. Procalcitonin 0.13.   - BC X 2 pending.  CRITICAL CARE Performed by: COLLETTA SHAVER N   Total critical care time: 15 minutes  Critical care time was exclusive of separately billable procedures and treating other patients.  Critical care was necessary to treat or prevent imminent or life-threatening deterioration.  Critical care was time spent personally by me on the following activities: development of treatment plan with patient and/or surrogate as well as nursing, discussions with consultants, evaluation of patient's response to treatment, examination of patient, obtaining history from patient or surrogate, ordering and performing treatments and interventions, ordering and review of laboratory studies, ordering and review of radiographic studies, pulse  oximetry and re-evaluation of patient's condition.    Length of Stay: [address], Njeri Vicente N, PA-C  02/28/2024, 7:22 AM  Advanced Heart Failure Team Pager [phone] (M-F; 7a - 5p)  Please contact [facility] for night-coverage after hours (5p -7a ) and weekends on [email or web address]

## 2024-02-28 NOTE — Progress Notes (Signed)
 Per Dr. Tobie notified of patients VTAC. No additional orders.

## 2024-02-28 NOTE — Progress Notes (Signed)
 [facility]  - [place] [facility]   PATIENT NAME: Ryan [name]    MR#:  [id number]  DATE OF BIRTH:  Nov 09, 1970  SUBJECTIVE:  no family at bedside resting quietly. Continued po diuretics and IV milrinone  gtt currently down to 11 L high flow nasal cannula oxygen. Worked with PT-- gets very exhausted easily. No fever today Pt NPO for RHC   VITALS:  Blood pressure 127/86, pulse 98, temperature 98 F (36.7 C), resp. rate 19, height 5' 7 (1.702 m), weight (!) 160.8 kg, SpO2 91%.  PHYSICAL EXAMINATION:   GENERAL:  53 y.o.-year-old patient with no acute distress. Morbid obesity LUNGS: distant breath sounds bilaterally Right tunnel IJ CARDIOVASCULAR: S1, S2 normal. No murmur   ABDOMEN: Soft, nontender, nondistended. Abdominal obesity  EXTREMITIES: + edema b/l.    NEUROLOGIC: nonfocal  patient is alert and awake  LABORATORY PANEL:  CBC Recent Labs  Lab 02/27/24 1330  WBC 10.1  HGB 13.0  HCT 41.5  PLT 341    Chemistries  Recent Labs  Lab 02/27/24 0544 02/27/24 0545 02/28/24 0437  NA  --    < > 137  K  --    < > 4.5  CL  --    < > 96*  CO2  --    < > 30  GLUCOSE  --    < > 121*  BUN  --    < > 35*  CREATININE  --    < > 2.15*  CALCIUM   --    < > 9.2  MG  --   --  2.2  AST 42*  --   --   ALT 46*  --   --   ALKPHOS 49  --   --   BILITOT 1.2  --   --    < > = values in this interval not displayed.      Assessment and Plan  53 y.o. 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 In the ER, CXR showed malpositioned CVC but no infiltrates. CTA chest showed chronic PE, no new findings, no pulmonary edema.   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 daily --Continue milrinone  IV infusion --Continue Lovenox  and Coumadin , pharmacy consulted for dosing and INR monitoring--now on coumadin . INR 2.6 --[facility] following --Discussed with ID, Bld cx most likely contamination -IR consulted, 25cm DL tunneled PICC line exchange was done on 6/23 --CXR: Increased left perihilar interstitial and patchy opacities, which may represent pulmonary edema or infection. Similar cardiomegaly. --PCCM consulted, overall long term poor prognosis, palliative care consulted. --7/1--plans for RHC by cardiology  Febrile illness -- CBC within normal limit. PRN Tylenol . -- Blood cultures from 6/30--neg so far --no fever todya    Acute gastroenteritis secondary to C. difficile and E. Coli --C. difficile antigen and PCR toxin positive --GI pathogen positive for E. Coli --Patient presented with fever, diarrhea and nausea vomiting. --DC'd broad-spectrum antibiotics vancomycin  and cefepime  --Discussed with ID, recommended to start vancomycin  orally Q6  hourly for 10 days --No need to treat E. Coli --Started probiotics   # AKI on CKD IIIa --Baseline sCr 1.4 --sCr 2.0 on admission, sCr 1.96 --1.48--1.62--2.09 --US  renal: Ruled out hydronephrosis, bladder distention, could not rule out urinary retention, PVR was not done -Avoid nephrology medication, your renal dose medication   # Metabolic acidosis due to renal failure --Bicarb IV 150 mEq given.  Acidosis resolved, bicarbonate is now within normal range.   # Paroxysmal A-fib --Currently patient is in sinus --Continue Coumadin    # HTN, HLD, dHF 6/25 resumed Torsemide  60 mg p.o. daily,  6/24 started Aldactone  50 mg p.o. daily S/p Lasix  as above   # Type 2 diabetes A1c <7% recently.  Diet  controlled - SS correction insulin    # Hypokalemia, potassium repleted. Monitor BMP daily   # Hyponatremia Mild - Diuresis as above and trend    # Iron  deficiency, transferrin saturation 5%, started Venofer  3 mg IV daily x 3 doses followed by oral supplement at discharge.   # Vitamin D  deficiency: started vitamin D  50,000 units p.o. weekly, follow with PCP to repeat vitamin D  level after 3 to 6 months.   # Vitamin B12 deficiency: Started vitamin B12 1000 mcg IM injection daily during hospital stay, followed by oral supplement.  Follow-up PCP to repeat vitamin B12 level after 3 to 6 months.   # Obstructive sleep apnea, continue CPAP   # Obesity class III, morbid obesity Body mass index is 53.11 kg/m.  Interventions: Calorie restricted diet and daily exercise advised to lose body weight.  Lifestyle modification discussed.     Diet: Heart healthy/carb modified diet DVT Prophylaxis: Subcutaneous Lovenox  and Coumadin  Family communication :none today Consults :[facility] cardiology CODE STATUS: Full DVT Prophylaxis :warfarin Level of care: Stepdown Status is: Inpatient Remains inpatient appropriate because: remains on milrinone  gtt and 10 L HFNC  Patient is at a high risk for cardiorespiratory arrest. Overall long-term prognosis poor  D/w pt and TOC to see if pt would be a candidate for LTAC    TOTAL TIME TAKING CARE OF THIS PATIENT: 35 minutes.  >50% time spent on counselling and coordination of care  Note: This dictation was prepared with Dragon dictation along with smaller phrase technology. Any transcriptional errors that result from this process are unintentional.  Alyzza Andringa M.D    [facility]   CC: Primary care physician; Marylynn Verneita CROME, MD

## 2024-02-28 NOTE — TOC Progression Note (Signed)
 Transition of Care [facility]) - Progression Note    Patient Details  Name: Ryan [name] MRN: [id number] Date of Birth: [birth year]  [facility] CM/SW Contact  Aubery Douthat A Mont Jagoda, RN Phone Number: 02/28/2024, 3:28 PM  Clinical Narrative:    Chart reviewed.  Noted that patient will have a Right Heart Cath today.  Patient remains on 02 at 11HFNC Patient continues to be on diuretics and IV milrinone  gtt.  Palliative has been consulted.  Cardiology is following.   I have spoken with patient about LTACH.  He is agreeable to [facility].  He has requested to go to [facility].  I have asked Cierra with [facility] to speak with patient about the services that [facility] can provide. [name] has informed me that patient would need insurance authorization and need to bring in the Adempas  and Opsumit with him to the facility as they do not carry these medications.    Cierra with [facility] informs me that patient would be appropriate for [facility].    TOC will continue to follow for discharge planning.       Barriers to Discharge: Continued Medical Work up  Expected Discharge Plan and Services   Discharge Planning Services: CM Consult   Living arrangements for the past 2 months: Single Family Home                                       Social Determinants of Health (SDOH) Interventions SDOH Screenings   Food Insecurity: No Food Insecurity (02/17/2024)  Housing: Low Risk  (02/17/2024)  Transportation Needs: Unmet Transportation Needs (02/17/2024)  Utilities: Not At Risk (02/17/2024)  Depression (PHQ2-9): Low Risk  (12/05/2023)  Financial Resource Strain: Low Risk  (08/18/2023)  Physical Activity: Insufficiently Active (08/18/2023)  Social Connections: Moderately Isolated (10/10/2023)  Stress: No Stress Concern Present (08/18/2023)  Tobacco Use: Low Risk  (02/17/2024)    Readmission Risk Interventions    09/23/2023    4:32 PM  Readmission Risk Prevention Plan   Transportation Screening Complete  PCP or Specialist Appt within 3-5 Days Complete  HRI or Home Care Consult Complete  Social Work Consult for Recovery Care Planning/Counseling Complete  Palliative Care Screening Not Applicable  Medication Review Oceanographer) Complete

## 2024-02-28 NOTE — Plan of Care (Signed)
                                                     Palliative Care Progress Note   Patient Name: Ryan [name]       Date: 02/28/2024 DOB: 09-01-1970  Age: 53 y.o. MRN#: [id number] Attending Physician: Tobie Calix, MD Primary Care Physician: Marylynn Verneita CROME, MD Admit Date: 02/17/2024  Extensive chart review completed including labs, vital signs, imaging, progress notes, orders, and available advanced directive documents from current and previous encounters.   Plan for RHC today.   Goals remain clear - full code/full scope. No acute palliative needs today. PMT will continue to follow peripherally and engage when appropriate.   Thank you for allowing the Palliative Medicine Team to assist in the care of Ryan [name].  Lamarr L. Arvid, DNP, FNP-BC Palliative Medicine Team  No charge

## 2024-02-28 NOTE — Plan of Care (Signed)
 Patient alert and oriented. Tolerating high-flow. Diet ordered after cath. External cath intact. CVP measured throughout the day. Per Dr. Zenaida no restriction's after heart cath. Decreased Milrinone  drip per order. Continue to assess.

## 2024-02-29 ENCOUNTER — Inpatient Hospital Stay
Admit: 2024-02-29 | Discharge: 2024-02-29 | Disposition: A | Discharge status: Home / Self Care | Attending: Physician Assistant | Admitting: Physician Assistant

## 2024-02-29 ENCOUNTER — Encounter: Payer: Self-pay | Admitting: Cardiology

## 2024-02-29 DIAGNOSIS — N179 Acute kidney failure, unspecified: Secondary | ICD-10-CM | POA: Diagnosis not present

## 2024-02-29 DIAGNOSIS — G4733 Obstructive sleep apnea (adult) (pediatric): Secondary | ICD-10-CM | POA: Diagnosis not present

## 2024-02-29 DIAGNOSIS — J9621 Acute and chronic respiratory failure with hypoxia: Secondary | ICD-10-CM

## 2024-02-29 DIAGNOSIS — N1831 Chronic kidney disease, stage 3a: Secondary | ICD-10-CM | POA: Diagnosis not present

## 2024-02-29 DIAGNOSIS — I509 Heart failure, unspecified: Secondary | ICD-10-CM | POA: Diagnosis not present

## 2024-02-29 DIAGNOSIS — I2781 Cor pulmonale (chronic): Secondary | ICD-10-CM | POA: Diagnosis not present

## 2024-02-29 DIAGNOSIS — I480 Paroxysmal atrial fibrillation: Secondary | ICD-10-CM | POA: Diagnosis not present

## 2024-02-29 DIAGNOSIS — J9691 Respiratory failure, unspecified with hypoxia: Secondary | ICD-10-CM

## 2024-02-29 LAB — BLOOD GAS, ARTERIAL
Acid-Base Excess: 4.9 mmol/L — ABNORMAL HIGH (ref 0.0–2.0)
Bicarbonate: 28.1 mmol/L — ABNORMAL HIGH (ref 20.0–28.0)
O2 Content: 10 L/min
O2 Saturation: 93.6 %
Patient temperature: 37
pCO2 arterial: 36 mmHg (ref 32–48)
pH, Arterial: 7.5 — ABNORMAL HIGH (ref 7.35–7.45)
pO2, Arterial: 63 mmHg — ABNORMAL LOW (ref 83–108)

## 2024-02-29 LAB — BASIC METABOLIC PANEL WITH GFR
Anion gap: 12 (ref 5–15)
BUN: 34 mg/dL — ABNORMAL HIGH (ref 6–20)
CO2: 26 mmol/L (ref 22–32)
Calcium: 9.2 mg/dL (ref 8.9–10.3)
Chloride: 97 mmol/L — ABNORMAL LOW (ref 98–111)
Creatinine, Ser: 2.09 mg/dL — ABNORMAL HIGH (ref 0.61–1.24)
GFR, Estimated: 37 mL/min — ABNORMAL LOW (ref >60)
Glucose, Bld: 112 mg/dL — ABNORMAL HIGH (ref 70–99)
Potassium: 4.4 mmol/L (ref 3.5–5.1)
Sodium: 135 mmol/L (ref 135–145)

## 2024-02-29 LAB — COOXEMETRY PANEL
Carboxyhemoglobin: 1.2 % (ref 0.5–1.5)
Methemoglobin: <0.7 % (ref 0.0–1.5)
O2 Saturation: 64.3 %
Total hemoglobin: 13.5 g/dL (ref 12.0–16.0)
Total oxygen content: 63.2 %

## 2024-02-29 LAB — ECHOCARDIOGRAM COMPLETE BUBBLE STUDY
AR max vel: 2.65 cm2
AV Area VTI: 2.75 cm2
AV Area mean vel: 2.25 cm2
AV Mean grad: 6 mmHg
AV Peak grad: 10.4 mmHg
Ao pk vel: 1.61 m/s
Area-P 1/2: 3.76 cm2
MV VTI: 2.67 cm2
S' Lateral: 1.6 cm

## 2024-02-29 LAB — CBC WITH DIFFERENTIAL/PLATELET
Abs Immature Granulocytes: 0.05 10*3/uL (ref 0.00–0.07)
Basophils Absolute: 0 10*3/uL (ref 0.0–0.1)
Basophils Relative: 1 %
Eosinophils Absolute: 0.2 10*3/uL (ref 0.0–0.5)
Eosinophils Relative: 2 %
HCT: 40.9 % (ref 39.0–52.0)
Hemoglobin: 12.7 g/dL — ABNORMAL LOW (ref 13.0–17.0)
Immature Granulocytes: 1 %
Lymphocytes Relative: 13 %
Lymphs Abs: 1 10*3/uL (ref 0.7–4.0)
MCH: 26.8 pg (ref 26.0–34.0)
MCHC: 31.1 g/dL (ref 30.0–36.0)
MCV: 86.3 fL (ref 80.0–100.0)
Monocytes Absolute: 0.8 10*3/uL (ref 0.1–1.0)
Monocytes Relative: 11 %
Neutro Abs: 5.8 10*3/uL (ref 1.7–7.7)
Neutrophils Relative %: 72 %
Platelets: 365 10*3/uL (ref 150–400)
RBC: 4.74 MIL/uL (ref 4.22–5.81)
RDW: 17.7 % — ABNORMAL HIGH (ref 11.5–15.5)
WBC: 7.9 10*3/uL (ref 4.0–10.5)
nRBC: 0 % (ref 0.0–0.2)

## 2024-02-29 LAB — PROTIME-INR
INR: 3 — ABNORMAL HIGH (ref 0.8–1.2)
Prothrombin Time: 32.2 s — ABNORMAL HIGH (ref 11.4–15.2)

## 2024-02-29 LAB — GLUCOSE, CAPILLARY
Glucose-Capillary: 108 mg/dL — ABNORMAL HIGH (ref 70–99)
Glucose-Capillary: 143 mg/dL — ABNORMAL HIGH (ref 70–99)

## 2024-02-29 MED ORDER — CYANOCOBALAMIN 1000 MCG PO TABS: 1000.0000 ug | ORAL_TABLET | Freq: Every day | ORAL | 0 refills | Status: DC

## 2024-02-29 MED ORDER — SPIRONOLACTONE 25 MG PO TABS: 25.0000 mg | ORAL_TABLET | Freq: Every day | ORAL | 0 refills | Status: AC

## 2024-02-29 MED ORDER — VITAMIN D (ERGOCALCIFEROL) 1.25 MG (50000 UNIT) PO CAPS: 50000.0000 [IU] | ORAL_CAPSULE | ORAL | 0 refills | Status: DC

## 2024-02-29 MED ORDER — WARFARIN SODIUM 5 MG PO TABS
5.0000 mg | ORAL_TABLET | Freq: Once | ORAL | Status: AC
  Administered 2024-02-29: 5 mg via ORAL
  Filled 2024-02-29: qty 1

## 2024-02-29 MED ORDER — MACITENTAN 10 MG PO TABS: 10.0000 mg | ORAL_TABLET | Freq: Every day | ORAL | 0 refills | Status: DC

## 2024-02-29 NOTE — Progress Notes (Signed)
*  PRELIMINARY RESULTS* Echocardiogram 2D Echocardiogram has been performed.  Floydene Harder 02/29/2024, 8:02 AM

## 2024-02-29 NOTE — Progress Notes (Signed)
 NAME:  KOFI MURRELL, MRN:  [id number], DOB:  [birth year], LOS: 12 ADMISSION DATE:  02/17/2024   History of Present Illness:  Mr. Schaible is a pleasant 53 year old male with a history of pulmonary hypertension secondary to CTEPH complicated by RV failure who presented to the hospital with with acute on chronic hypoxic respiratory failure. With worsening oxygen requirements over the past 12 hours, PCCM is consulted for further input.   Patient is followed closely by my colleagues Dr. Malka in pulmonary clinic and Dr. Zenaida in  advanced heart failure clinic. He has had a history of recurrent DVT's and PET, starting at age 73, and previously requiring multiple IVC filters. He has failed multiple agents for anti-coagulation, and is currently on warfarin.   Mr. Cancio has had multiple acute on chronic pulmonary emboli, with the most recent episodes of February of 2023, April of 2024, and September of 2024. His CT angiograms have shown signs of CTEPH, with bulky chronic clot. He underwent mechanical thrombectomy multiple times. His most recent thrombectomy procedure was in September of 2024. He is maintained on IV milrinone  outpatient, in addition to PO Riociguat  and PO torsemide . He is anticoagulated with Warfarin.    RHC in April of 2024 (on 0.25 mcg/kg/min of milrinone ) showed RA 13, RV 110/4, PA 110/36 (60), PAOP 12 mmHg. TPG 48, PVR 9.3 Wood, PAPi 5.7; CO/CI by FI was 5.07/1.96.   He presented on 02/17/2024 with diarrhea, malaise and weakness for a few days prior to presentation. On EMS activation, he was found to be significantly hypoxic, with significant increase in his oxygen requirements (usually 2-3 L via [place] at home). A CTA of the chest in the ED did not show acute PE, but did note his chronic clots. Blood work showed AKI on CKD, and BNP was increased compared to baseline. His procalcitonin was mildly increased. Lactic acid was initially borderline at 2, and improved after IV diuresis and increase in his  milrinone  dose. Stool studies showed C. Diff colitis.  Pertinent  Medical History  -Pulmonary Hypertension (WHO Group IV - CTEPH) -RV failure -CKD -IVC filter -Severe OSA     Significant Hospital Events: Including procedures, antibiotic start and stop dates in addition to other pertinent events   6/20: admit, initiated on IV diuresis, milrinone  dose increased to 0.375 6/23: increased oxygen requirements PCCM consult 07/02: s/p Aggressive diuresis and addition of Macitentan to his regimen. RHC on 07/01 w/ Improved PAP 97/22 ( ), improved PVR 5.56 W,U, PCWP . CO 7.56 l/min and CI 2.93 L/min/m2. Milrinone  dose was decreased yesterday to 0.125 mcg/kg/min. Remains on 15L via .   Interim History / Subjective:  Patient feels better than when he was admitted. Surprisingly does not feel short of breath at O2 sats of 87%.  I decreased his Oxygen to 10L/min and he sustained at 91 92%.   Objective    Blood pressure (!) 120/105, pulse (!) 104, temperature 97.7 F (36.5 C), temperature source Oral, resp. rate (!) 26, height 5' 7 (1.702 m), weight 131.5 kg, SpO2 93%. CVP:  [6 mmHg-71 mmHg] 71 mmHg  FiO2 (%):  [40 %] 40 %   Intake/Output Summary (Last 24 hours) at 02/29/2024 1522 Last data filed at 02/29/2024 1043 Gross per 24 hour  Intake 384.46 ml  Output 850 ml  Net -465.54 ml   Filed Weights   02/28/24 0500 02/28/24 0937 02/29/24 0416  Weight: (!) 160.8 kg (!) 160.8 kg 131.5 kg    Examination: General: No  acute distress, morbidly obese.  HENT: Supple neck, reactive pupils Lungs: Clear bilateral air entry Cardiovascular: Normal S1, Normal S2, tachy, Milrinone  at 0.125.  Abdomen: Obese, soft, non tender Extremities: Warm, no edema.   Labs and imaging were reviewed.   Assessment and Plan  Mr. Rezabek is a pleasant 53 year old male with a history of pulmonary hypertension secondary to CTEPH complicated by RV failure who presented to the hospital with with acute on chronic  hypoxic respiratory failure. With worsening oxygen requirements despite improved hemodynamics and pulmonary arterial pressures.  He was admitted initially with C. difficile colitis that was treated medically.  With course complicated by CHF exacerbation and worsening acute hypoxic respiratory failure.  He was diuresed adequately and macitentan was added to his pulmonary hypertension regimen.  He did well and recovered well.  His most recent right heart cath on 07/01 showed improved mean pulmonary artery now at 46 mmHg, improved PVR now at 5.5 Wood units from 9..  Improved cardiac output and cardiac index.  This led to reduce dose of milrinone  now at 0.125 mcg/kg/min.  He is also on riociguat  2.5 mg 3 times daily, Opsumit 10 mg daily, spironolactone  25 mg daily.  Unfortunately, he remains on high oxygen requirement of oxygen now at 10 L/min nasal cannula.  His baseline is 2 to 3 L intermittently on exertion and during sleep.  Echo with bubble study without any intra atrial shunting.  1 expected oxygen requirement with decrease in the setting of improved PVR.  I suspect some degree of atelectasis, significant VQ mismatch in the setting of CTEPH.   Furthermore, he does have severe OSA with AHI 42. He was supposed to see sleep medicine at [facility] but unfortunately was admitted to [facility]. I recommend trialing biapap at bedtime and during naps. Hopefully this will help with some recruitment as well.   Finally, discussed case with Advanced heart failure and his primary team and we agree that the best next step would be to transfer Mr. Mcnease to [facility] for evaluation of Pulmonary Angioplasty vs Endarterectomy.   I spent 50 minutes caring for this patient today, including preparing to see the patient, obtaining a medical history , reviewing a separately obtained history, performing a medically appropriate examination and/or evaluation, counseling and educating the patient/family/caregiver, documenting  clinical information in the electronic health record, and independently interpreting results (not separately reported/billed) and communicating results to the patient/family/caregiver  Darrin Barn, MD  Pulmonary Critical Care 02/29/2024 4:15 PM

## 2024-02-29 NOTE — Plan of Care (Signed)
  Problem: Education: Goal: Knowledge of General Education information will improve Description: Including pain rating scale, medication(s)/side effects and non-pharmacologic comfort measures Outcome: Progressing   Problem: Clinical Measurements: Goal: Respiratory complications will improve Outcome: Progressing   Problem: Activity: Goal: Risk for activity intolerance will decrease Outcome: Progressing   Problem: Nutrition: Goal: Adequate nutrition will be maintained Outcome: Progressing   Problem: Coping: Goal: Level of anxiety will decrease Outcome: Progressing   Problem: Elimination: Goal: Will not experience complications related to bowel motility Outcome: Progressing Goal: Will not experience complications related to urinary retention Outcome: Progressing   Problem: Pain Managment: Goal: General experience of comfort will improve and/or be controlled Outcome: Progressing   Problem: Safety: Goal: Ability to remain free from injury will improve Outcome: Progressing   Problem: Skin Integrity: Goal: Risk for impaired skin integrity will decrease Outcome: Progressing   Problem: Education: Goal: Ability to verbalize understanding of medication therapies will improve Outcome: Progressing   Problem: Activity: Goal: Capacity to carry out activities will improve Outcome: Progressing   Problem: Coping: Goal: Ability to adjust to condition or change in health will improve Outcome: Progressing   Problem: Activity: Goal: Ability to return to baseline activity level will improve Outcome: Progressing   Problem: Cardiovascular: Goal: Ability to achieve and maintain adequate cardiovascular perfusion will improve Outcome: Progressing Goal: Vascular access site(s) Level 0-1 will be maintained Outcome: Progressing

## 2024-02-29 NOTE — Progress Notes (Signed)
 Occupational Therapy Treatment Patient Details Name: SHOLOM DULUDE MRN: [id number] DOB: July 14, 1971 Today's Date: 02/29/2024   History of present illness 53 y/o male presented to ED on 02/17/24 for worsening SOB and cough along with syncope. Admitted for suspected cor pulmonale and pulmonary HTN. 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 Easily arousable and agreeable to OT session. Nurse okay for pt to be placed back on 10L at rest at 93%, increased to 12L for bathing session and maintained in 90's with drop after lateral steps and return to supine dropped to 82%, but with PLB and rest improved to 94% and able to wean back to 10L HFNC He reports mild pain/tenderness to his buttocks region. Pt performed bed mobility with supervision, increased time and management of lines/leads. Supervision level for all seated bathing, Min A for bathing of bil feet and buttocks in standing at RW. Pt able to stand from EOB and take lateral steps to [facility] prior to returning to supine with SBA. Pt returned to bed with all needs in place and will cont to require skilled acute OT services to maximize his safety and IND to return to PLOF.       If plan is discharge home, recommend the following:  Help with stairs or ramp for entrance;Assistance with cooking/housework;A little help with walking and/or transfers;A little help with bathing/dressing/bathroom   Equipment Recommendations  BSC/3in1;Tub/shower seat    Recommendations for Other Services      Precautions / Restrictions Precautions Precautions: Fall Recall of Precautions/Restrictions: Intact Restrictions Weight Bearing Restrictions Per Provider Order: No       Mobility Bed Mobility Overal bed mobility: Needs Assistance Bed Mobility: Supine to Sit, Sit to Supine     Supine to sit: Supervision Sit to supine: Supervision   General bed mobility  comments: no physical assist, lines/leads management    Transfers Overall transfer level: Needs assistance Equipment used: Rolling walker (2 wheels) Transfers: Sit to/from Stand Sit to Stand: Supervision           General transfer comment: supervision to stand from EOB with UE support on RW able to stand during peri-care and lateral step to [facility] prior to return to supine     Balance Overall balance assessment: Needs assistance Sitting-balance support: No upper extremity supported, Feet supported Sitting balance-Leahy Scale: Good     Standing balance support: Single extremity supported Standing balance-Leahy Scale: Fair Standing balance comment: unilateral support on RW during side steps to [facility] with supervision                           ADL either performed or assessed with clinical judgement   ADL Overall ADL's : Needs assistance/impaired     Grooming: Set up;Sitting;Applying deodorant;Oral care;Wash/dry face Grooming Details (indicate cue type and reason): at EOB Upper Body Bathing: Supervision/ safety;Sitting Upper Body Bathing Details (indicate cue type and reason): at EOB Lower Body Bathing: Minimal assistance;Sitting/lateral leans;Sit to/from stand Lower Body Bathing Details (indicate cue type and reason): able to bathe all areas seated at EOB except bil feet and buttocks in standing Upper Body Dressing : Minimal assistance;Sitting Upper Body Dressing Details (indicate cue type and reason): to manage IV and all lines/leads   Lower Body Dressing Details (indicate cue type and reason): able to don/doff slips on shoes without issue-would need Max  A for socks                    Extremity/Trunk Assessment              Vision       Perception     Praxis     Communication Communication Communication: No apparent difficulties   Cognition Arousal: Alert Behavior During Therapy: WFL for tasks assessed/performed                                  Following commands: Intact        Cueing      Exercises      Shoulder Instructions       General Comments found on bipap with nurse removing for session; placed on 10L at rest at 93%, increased to 12L for bathing session and maintained in 90's with drop after lateral steps and return to supine dropped to 82%, but with PLB and rest improved to 94% and able to wean back to 10L HFNC    Pertinent Vitals/ Pain       Pain Assessment Pain Assessment: Faces Faces Pain Scale: Hurts a little bit Pain Location: buttocks/tender Pain Intervention(s): Monitored during session, Repositioned  Home Living                                          Prior Functioning/Environment              Frequency  Min 3X/week        Progress Toward Goals  OT Goals(current goals can now be found in the care plan section)  Progress towards OT goals: Progressing toward goals  Acute Rehab OT Goals Patient Stated Goal: return to PLOF OT Goal Formulation: With patient Time For Goal Achievement: 03/09/24 Potential to Achieve Goals: Good  Plan      Co-evaluation                 AM-PAC OT 6 Clicks Daily Activity     Outcome Measure   Help from another person eating meals?: None Help from another person taking care of personal grooming?: None Help from another person toileting, which includes using toliet, bedpan, or urinal?: A Little Help from another person bathing (including washing, rinsing, drying)?: A Little Help from another person to put on and taking off regular upper body clothing?: A Little Help from another person to put on and taking off regular lower body clothing?: A Lot 6 Click Score: 19    End of Session Equipment Utilized During Treatment: Oxygen;Rolling walker (2 wheels)  OT Visit Diagnosis: Other abnormalities of gait and mobility (R26.89);Muscle weakness (generalized) (M62.81)   Activity Tolerance Patient tolerated treatment  well   Patient Left in bed;with call bell/phone within reach;with bed alarm set   Nurse Communication Mobility status        Time: 8561-8482 OT Time Calculation (min): 39 min  Charges: OT General Charges $OT Visit: 1 Visit OT Treatments $Self Care/Home Management : 38-52 mins  Britlee Skolnik, OTR/L  02/29/24, 4:06 PM   Burtis Imhoff E Haleema Vanderheyden 02/29/2024, 4:03 PM

## 2024-02-29 NOTE — Progress Notes (Addendum)
 Advanced Heart Failure Rounding Note  [facility] Cardiologist: Dr. Zenaida    Chief Complaint:  Hypoxia   Patient Profile  Patient is a chronically ill 53 year old male with extensive family history of clotting disorders. 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 Per chart review, has MTHFR gene mutation and was previously followed by Hematology (Dr. Darcy). There has been some concern for poor compliance with anticoagulation. Hypercoagulable workup has been unrevealing. 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  Multiple hospitalizations in the past several months.  9/24: admitted w/ recurrent PE s/p mechanical thrombectomy  10/24: readmitted w/ a/c respiratory failure. CTA showed persistent nonocclusive thrombus in the right main pulmonary artery as well as new small left upper lobe segmental PE  12/24: Readmitted in 12/24 with worsening dyspnea d/t acute on chronic RV failure. CTA with resolution of prior left-sided PE and stable chronic right-sided PE   Now readmitted w/ a/c hypoxic respiratory failure and a/c RV failure + acute C-diff infection. CTPE (-) for acute PE, chronic PE w/ evidence of CTEPH. Subtherapeutic INR at 1.6.    Pertinent Studies:   RHC 02/28/24 HEMODYNAMICS: RA:       3 mmHg (mean) RV:       91/1, 3 mmHg PA:       97/22 mmHg (46 mean) PCWP: 10 mmHg (mean)                                      Estimated Fick CO/CI   7.56L/min, 2.93L/min/m2       Thermodilution CO/CI   6.48L/min, 2.51L/min/m2                                      TPG  36  mmHg                                               PVR  5.56 Wood Units  PAPi  25       IMPRESSION: Right heart catheterization for evaluation of known pulmonary hypertension on 0.25 of milrinone , 10mg  of macitentan, riociguat  2.5mg  TID Low filling  pressures Severely elevated pulmonary pressures Significantly improved cardiac output on milrinone  support Significantly improved PVR  RHC + Pulmonary Angiography (Prior to this admission on Milrinone  0.25) 12/16/23:  RA 13, PA 110/36 (60), PCWP 12, CO/CI (TD) 5.15/1.99, TPG 48, PVR 9.3, PAPi 5.7  Selective pulmonary angiography with evidence of disease in the left PA, segmental and subsegmental in anteromedial basal territory, likely segmental disease in the right upper lobe. Images somewhat limited by camera acquisition and body habitus.  Interval Hx/ Subjective:    Feels okay. No dyspnea at rest. Currently on 15L HFNC with O2 sats upper 80s-low 90s.  CO-OX 64% on 0.125 milrinone   Scr slightly better today, 2.09. CVP 5. Continuing to hold diuretics.  Ambulates a few feet with PT but otherwise has been in bed.   Objective:    Weight Range: 131.5 kg Body mass index is 45.41 kg/m.   Vital Signs:   Temp:  [98.3 F (36.8 C)] 98.3 F (36.8  C) (07/01 2000) Pulse Rate:  [85-117] 98 (07/02 0905) Resp:  [12-33] 16 (07/02 0905) BP: (88-136)/(46-92) 107/64 (07/02 0900) SpO2:  [87 %-100 %] 90 % (07/02 0921) Weight:  [131.5 kg-160.8 kg] 131.5 kg (07/02 0416) Last BM Date : 02/27/24  Weight change: Filed Weights   02/28/24 0500 02/28/24 0937 02/29/24 0416  Weight: (!) 160.8 kg (!) 160.8 kg 131.5 kg   Intake/Output:  Intake/Output Summary (Last 24 hours) at 02/29/2024 0927 Last data filed at 02/29/2024 0800 Gross per 24 hour  Intake 384.46 ml  Output 900 ml  Net -515.54 ml    Physical Exam    General:  Chronically ill appearing Neck: JVP difficult Cor: Regular rate & rhythm. No rubs, gallops or murmurs. Lungs: breathing nonlabored. Comfortable on HFNC. Abdomen: obese, soft, nondistended Extremities: trace edema, chronic stasis changes Neuro: alert & orientedx3. Affect pleasant   Telemetry    SR/ST  Labs    CBC Recent Labs    02/27/24 1330 02/28/24 1526  02/28/24 1527 02/29/24 0357  WBC 10.1  --   --  7.9  NEUTROABS 7.6  --   --  5.8  HGB 13.0   < > 14.3 12.7*  HCT 41.5   < > 42.0 40.9  MCV 85.2  --   --  86.3  PLT 341  --   --  365   < > = values in this interval not displayed.   Basic Metabolic Panel Recent Labs    92/98/74 0437 02/28/24 1526 02/28/24 1527 02/29/24 0357  NA 137   < > 136 135  K 4.5   < > 4.5 4.4  CL 96*  --   --  97*  CO2 30  --   --  26  GLUCOSE 121*  --   --  112*  BUN 35*  --   --  34*  CREATININE 2.15*  --   --  2.09*  CALCIUM  9.2  --   --  9.2  MG 2.2  --   --   --   PHOS 3.6  --   --   --    < > = values in this interval not displayed.   BNP (last 3 results) Recent Labs    10/24/23 1410 02/17/24 1037 02/21/24 0408  BNP 84.4 424.2* 141.2*   Medications:    Scheduled Medications:  Chlorhexidine  Gluconate Cloth  6 each Topical Daily   vitamin B-12  1,000 mcg Oral Daily   insulin  aspart  0-5 Units Subcutaneous QHS   insulin  aspart  0-9 Units Subcutaneous TID WC   macitentan  10 mg Oral Daily   multivitamin with minerals  1 tablet Oral Daily   pantoprazole   40 mg Oral Daily   Riociguat   2.5 mg Oral TID   saccharomyces boulardii  250 mg Oral BID   spironolactone   25 mg Oral Daily   Vitamin D  (Ergocalciferol )  50,000 Units Oral Q7 days   Warfarin - Pharmacist Dosing Inpatient   Does not apply q1600   Infusions:  milrinone  0.125 mcg/kg/min (02/29/24 0700)   PRN Medications: acetaminophen  **OR** acetaminophen , albuterol , ondansetron  **OR** ondansetron  (ZOFRAN ) IV, mouth rinse, oxyCODONE   Assessment/Plan   Acute on chronic 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 Initial LA 2.0>>1.1>>1.3.  - Echo 6/24: EF >55%, flattened septum in systole/diastole, mod reduced RV - RHC 02/28/24 on 0.25 milrinone , 10 mg macitentan, riociguat  2.5 TID: RA mean 3, PA mean 46, PCWP mean 10, Fick CI 2.9, TD CI 2.5, PVR 5.56.  - Negative bubble study 07/02 - Milrinone  weaned to 0.125 mcg/kg\/min following RHC 07/01 - CO-OX 64% this am on 0.125 milrinone  - Continue spiro 25 mg daily - continue macitentan 10 mg daily (added this admit) - Continue Adempas  2.5 mg tid - INR 3.0 (Goal 2.5-3.5). PharmD dosing coumadin   - Patient has 2 IVC filters that are clotted with no IVC return, has been evaluated at [facility] for removal and felt not to be a candidate. Pending evaluation at [facility]  - Continue to wean O2 for target O2 sat 88%, pulmonary hypertension does not appear to be source for persistent hypoxemia   2. Recurrent DVT/PE - Chronic, stable PE on CTA this admit.  - INR 3.0 today - Warfarin dosing per PharmD.   3. Paroxysmal atrial fibrillation/flutter - Currently sinus tach - Monitor with milrinone . - on Coumadin     4. AKI on CKD IIIa - b/l SCr ~1.5-1.7 - Scr peaked at 2.3 with diuresis, holding diuretics. Slowly improving, 2.09 today  5. Bacteremia Diarrhea/C-Diff Infection  - GI panel + for Ecoli  - Initial BCx + for Staph Epi (likely contaminant). Repeat BC with NG. - on po Vanc per ID - on saccharomyces boulardii bid   6. Hypokalemia/Hypomagnesemia   - Resoled.  - Supp as needed  7. Ventricular Bigeminy - in setting of low K/Mg + milrinone   - resolved  8. Acute on Chronic Hypoxic Respiratory Failure - PCCM has seen. Pt felt to be high risk for cardiac arrest if intubation/mechanical ventilation pursued given RV failure and severe PH. - Pulmonary hypertension does not appear to be source of ongoing hypoxia. RHC 07/01 with persistently elevated pulmonary  pressures but significantly improved PVR and CI on milrinone .  - Negative bubble study on echo 07/02 - Currently on 15L HFNC, target O2 sat 88% - Needs to get out of bed, ambulate today. Continue IS.   9. Fever - Noted 06/30. Resolved. - No leukocytosis. Procalcitonin 0.13.   - BC X 2 NG X 2 days.   Needs to mobilize. Out of bed today. Use incentive spirometer.  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   Length of Stay: [address], MANUELITA SAILOR, PA-C  02/29/2024, 9:27 AM  Advanced Heart Failure Team Pager [phone] (M-F; 7a - 5p)  Please contact [facility] for night-coverage after hours (5p -7a ) and weekends on [email or web address]

## 2024-02-29 NOTE — Progress Notes (Signed)
 Report called to [facility].

## 2024-02-29 NOTE — Progress Notes (Signed)
 [facility]  - [place] [facility]   PATIENT NAME: Ryan [name]    MR#:  [id number]  DATE OF BIRTH:  10/01/1970  SUBJECTIVE:  no family at bedside resting quietly. Patient currently sitting in the chair. Underwent right heart catheterization yesterday.  Still requiring significant amount of oxygen 10 liters Started on BIPAP per Pulmonary rec  VITALS:  Blood pressure 108/65, pulse 99, temperature 97.7 F (36.5 C), temperature source Oral, resp. rate 18, height 5' 7 (1.702 m), weight 131.5 kg, SpO2 95%.  PHYSICAL EXAMINATION:   GENERAL:  53 y.o.-year-old patient with no acute distress. Morbid obesity LUNGS: distant breath sounds bilaterally Right tunnel IJ CARDIOVASCULAR: S1, S2 normal. No murmur   ABDOMEN: Soft, nontender, nondistended. Abdominal obesity  EXTREMITIES: + edema b/l.    NEUROLOGIC: nonfocal  patient is alert and awake  LABORATORY PANEL:  CBC Recent Labs  Lab 02/29/24 0357  WBC 7.9  HGB 12.7*  HCT 40.9  PLT 365    Chemistries  Recent Labs  Lab 02/27/24 0544 02/27/24 0545 02/28/24 0437 02/28/24 1526 02/29/24 0357  NA  --    < > 137   < > 135  K  --    < > 4.5   < > 4.4  CL  --    < > 96*  --  97*  CO2  --    < > 30  --  26  GLUCOSE  --    < > 121*  --  112*  BUN  --    < > 35*  --  34*  CREATININE  --    < > 2.15*  --  2.09*  CALCIUM   --    < > 9.2  --  9.2  MG  --   --  2.2  --   --   AST 42*  --   --   --   --   ALT 46*  --   --   --   --   ALKPHOS 49  --   --   --   --   BILITOT 1.2  --   --   --   --    < > = values in this interval not displayed.      Assessment and Plan  53 y.o. 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 In the ER, CXR showed malpositioned CVC but no infiltrates. CTA chest showed chronic PE, no new  findings, no pulmonary edema.   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 daily --Continue milrinone  IV infusion --Continue Lovenox  and Coumadin , pharmacy consulted for dosing and INR monitoring--now on coumadin . INR 2.6 --[facility] following --Discussed with ID, Bld cx most likely contamination -IR consulted, 25cm DL tunneled PICC line exchange was done on 6/23 --CXR: Increased left perihilar interstitial and patchy opacities, which may represent pulmonary edema or infection. Similar cardiomegaly. --PCCM consulted, overall long term poor prognosis, palliative care consulted. --7/1--plans for  RHC by cardiology --7/2--RHC yesterday with significant improvement in cardiac output & PVR; PASP remains severely elevated in the setting of chronic CTEPH.  -- Cardiology things patient may need to transfer to [facility] for further management of his severe pulmonary hypertension. Await final recommendations -- started on BiPAP per pulmonary recommendation  Febrile illness -- CBC within normal limit. PRN Tylenol . -- Blood cultures from 6/30--neg so far --no fever     Acute gastroenteritis secondary to C. difficile and E. Coli --C. difficile antigen and PCR toxin positive --GI pathogen positive for E. Coli --Patient presented with fever, diarrhea and nausea vomiting. --Discussed with ID, recommended to start vancomycin  orally Q6 hourly for 10 days --No need to treat E. Coli --Started probiotics   # AKI on CKD IIIa --Baseline sCr 1.4 --sCr 2.0 on admission, sCr 1.96 --1.48--1.62--2.09 --US  renal: Ruled out hydronephrosis, bladder  distention, could not rule out urinary retention, PVR was not done -Avoid nephrology medication, your renal dose medication   # Metabolic acidosis due to renal failure --Bicarb IV 150 mEq given.  Acidosis resolved, bicarbonate is now within normal range.   # Paroxysmal A-fib --Currently patient is in sinus --Continue Coumadin    # HTN, HLD, dHF 6/25 resumed Torsemide  60 mg p.o. daily,  6/24 started Aldactone  50 mg p.o. daily S/p Lasix  as above   # Type 2 diabetes A1c <7% recently.  Diet controlled - SS correction insulin    # Hypokalemia, potassium repleted. Monitor BMP daily   # Hyponatremia Mild - Diuresis as above and trend    # Iron  deficiency, transferrin saturation 5%, started Venofer  3 mg IV daily x 3 doses followed by oral supplement at discharge.   # Vitamin D  deficiency: started vitamin D  50,000 units p.o. weekly, follow with PCP to repeat vitamin D  level after 3 to 6 months.   # Vitamin B12 deficiency: Started vitamin B12 1000 mcg IM injection daily during hospital stay, followed by oral supplement.  Follow-up PCP to repeat vitamin B12 level after 3 to 6 months.   # Obstructive sleep apnea, continue CPAP   # Obesity class III, morbid obesity Body mass index is 53.11 kg/m.    Diet: Heart healthy/carb modified diet DVT Prophylaxis: Subcutaneous Lovenox  and Coumadin  Family communication :none today Consults :[facility] cardiology CODE STATUS: Full DVT Prophylaxis :warfarin Level of care: Stepdown Status is: Inpatient Remains inpatient appropriate because: remains on milrinone  gtt and 10 L HFNC  Patient is at a high risk for cardiorespiratory arrest. Overall long-term prognosis poor D/w pt and TOC to see if pt would be a candidate for LTAC if there are no plans for transfer to [facility] and insurance approves it    TOTAL TIME TAKING CARE OF THIS PATIENT: 45 minutes.  >50% time spent on counselling and coordination of care  Note: This dictation was prepared with Dragon  dictation along with smaller phrase technology. Any transcriptional errors that result from this process are unintentional.  Gabrial Domine M.D    [facility]   CC: Primary care physician; Marylynn Verneita CROME, MD

## 2024-02-29 NOTE — Discharge Summary (Signed)
 Physician Discharge Summary   Patient: Ryan [name] MRN: [id number] DOB: [birth year]  Admit date:     02/17/2024  Discharge date: 02/29/24  Discharge Physician: Leita Blanch   PCP: Marylynn Verneita CROME, MD   Pt is transferring to [facility]  Discharge Diagnoses: Principal Problem:   Cor pulmonale [facility]) Active Problems:   Chronic diastolic (congestive) heart failure (HCC)   AKI (acute kidney injury) (HCC)   Essential hypertension   Type 2 diabetes mellitus with diabetic chronic kidney disease (HCC)   CKD (chronic kidney disease), stage IIIa   Morbid obesity with BMI of 50.0-59.9, adult (HCC)   Hyperlipidemia associated with type 2 diabetes mellitus (HCC)   Pulmonary hypertension, unspecified (HCC)   Paroxysmal atrial fibrillation (HCC)   Central sleep apnea   S/P insertion of IVC (inferior vena caval) filter   Right ventricular failure (HCC)   CTEPH (chronic thromboembolic pulmonary hypertension) (HCC)   Chronic hypoxic respiratory failure, on home oxygen therapy (HCC)   Hyponatremia   Malpositioned central line   Acute respiratory failure with hypoxia (HCC)   Palliative care by specialist  53 y.o. 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 In the ER, CXR showed malpositioned CVC but no infiltrates. CTA chest showed chronic PE, no new findings, no pulmonary edema.    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 daily--now  d/ced --Continue milrinone  IV infusion --Continue Lovenox  and Coumadin , pharmacy consulted for dosing and INR monitoring--now on coumadin . INR 2.6 --[facility] Cardiology following -IR consulted, 25cm DL tunneled PICC line exchange was done on 6/23 --CXR: Increased left perihilar interstitial and patchy opacities, which may represent pulmonary edema or infection. Similar cardiomegaly. --PCCM consulted, overall long term poor prognosis, palliative care consulted. --7/1--plans for RHC by cardiology --7/2--RHC yesterday with significant improvement in cardiac output & PVR; PASP remains severely elevated in the setting of chronic CTEPH.  -- Cardiology things patient may need to transfer to [facility] for further management of his severe pulmonary hypertension. Await final recommendations -- started on BiPAP per pulmonary recommendation --cont current cardiac meds including milrinone  gtt.  --pt is not on diuretics currently--d/ced by cardiology   Febrile illness -- CBC within normal limit. PRN Tylenol . -- Blood cultures from 6/30--neg so far --no fever     Acute gastroenteritis secondary to C. difficile and E. Coli --C. difficile antigen and PCR toxin positive --GI pathogen positive for E. Coli --Patient presented with fever, diarrhea and nausea vomiting. --Discussed with ID, recommended to start vancomycin  orally Q6 hourly for 10 days--completed --No need to treat E. Coli --Started probiotics   # AKI on CKD IIIa --Baseline sCr 1.4 --sCr 2.0 on admission, sCr 1.96 --1.48--1.62--2.09 --US  renal: Ruled out hydronephrosis, bladder distention, could not rule out urinary retention, PVR was not done -Avoid nephrology medication, your renal dose medication   # Paroxysmal A-fib --Currently patient is in sinus --Continue Coumadin   INR 3.0-- managed by Pharmacy   # Type 2 diabetes A1c <7% recently.  Diet controlled - SS correction insulin    # Hypokalemia, potassium repleted. Monitor BMP daily   #  Hyponatremia Mild - Diuresis as above and trend    # Iron  deficiency, transferrin saturation 5%, started Venofer  3 mg IV daily x 3 doses followed by oral supplement at discharge.   # Vitamin D  deficiency: started vitamin D  50,000 units p.o. weekly, follow with PCP to repeat vitamin D  level after 3 to 6 months.   # Vitamin B12 deficiency: Started vitamin B12 1000 mcg IM injection daily during hospital stay, followed by oral supplement.  Follow-up PCP to repeat vitamin B12 level after 3 to 6 months.   # Obstructive sleep apnea, continue CPAP   # Obesity class III, morbid obesity Body mass index is 53.11 kg/m.     Pt is transferred to [facility] for further evaluation of his severe PHT. Pt is accepted by Dr Arley Angus Diet: Heart healthy/carb modified diet DVT Prophylaxis: Subcutaneous Lovenox  and Coumadin  Family communication :none today Consults :[facility] cardiology, pulmonary, ID CODE STATUS: Full DVT Prophylaxis :warfarin    Disposition: [facility] Diet recommendation:  Discharge Diet Orders (From admission, onward)     Start     Ordered   02/29/24 0000  Diet - low sodium heart healthy        02/29/24 1823            DISCHARGE MEDICATION: Allergies as of 02/29/2024       Reactions   Jardiance  [empagliflozin ]    Caused infection    Metrizamide Other (See Comments)   Kidney failure requiring dialysis   Clindamycin/lincomycin Rash   Lincomycin Rash   Sulfa Antibiotics Rash        Medication List     PAUSE taking these medications    torsemide  20 MG tablet Wait to take this until your doctor or other care provider tells you to start again. By cardiology Commonly known as: DEMADEX  Take 2 tablets (40 mg total) by mouth daily.       STOP taking these medications    tirzepatide  7.5 MG/0.5ML Pen Commonly known as: MOUNJARO        TAKE these medications    Adempas  2.5 MG Tabs Generic drug: Riociguat  Take 2.5 mg by mouth 3 (three) times daily.   albuterol  108  (90 Base) MCG/ACT inhaler Commonly known as: VENTOLIN  HFA Inhale 2 puffs into the lungs every 6 (six) hours as needed for wheezing or shortness of breath.   cyanocobalamin  1000 MCG tablet Take 1 tablet (1,000 mcg total) by mouth daily. Start taking on: March 01, 2024   macitentan 10 MG tablet Commonly known as: OPSUMIT Take 1 tablet (10 mg total) by mouth daily. Start taking on: March 01, 2024   milrinone  20 MG/100 ML Soln infusion Commonly known as: PRIMACOR  Inject 0.0413 mg/min into the vein continuous.   multivitamin with minerals Tabs tablet Take 1 tablet by mouth daily.   pantoprazole  40 MG tablet Commonly known as: PROTONIX  Take 1 tablet (40 mg total) by mouth daily. On an empty stomach 30 minutes prior to eating   spironolactone  25 MG tablet Commonly known as: ALDACTONE  Take 1 tablet (25 mg total) by mouth daily. Start taking on: March 01, 2024   Vitamin D  (Ergocalciferol ) 1.25 MG (50000 UNIT) Caps capsule Commonly known as: DRISDOL  Take 1 capsule (50,000 Units total) by mouth every 7 (seven) days. Start taking on: March 03, 2024   warfarin 5 MG tablet Commonly known as: COUMADIN  Take 1 tablet (5 mg  total) by mouth daily. What changed:  when to take this additional instructions   warfarin 1 MG tablet Commonly known as: COUMADIN  Take with 5 mg on Thursdays and Sundays What changed:  how much to take how to take this when to take this additional instructions        Discharge Exam: Filed Weights   02/28/24 0500 02/28/24 0937 02/29/24 0416  Weight: (!) 160.8 kg (!) 160.8 kg 131.5 kg  GENERAL:  53 y.o.-year-old patient with no acute distress. Morbid obesity LUNGS: distant breath sounds bilaterally Right tunnel IJ CARDIOVASCULAR: S1, S2 normal. No murmur   ABDOMEN: Soft, nontender, nondistended. Abdominal obesity  EXTREMITIES: + edema b/l.    NEUROLOGIC: nonfocal  patient is alert and awake   Condition at discharge: fair  The results of significant  diagnostics from this hospitalization (including imaging, microbiology, ancillary and laboratory) are listed below for reference.   Imaging Studies: ECHOCARDIOGRAM COMPLETE BUBBLE STUDY Result Date: 02/29/2024    ECHOCARDIOGRAM REPORT   Patient Name:   Ryan [name] Date of Exam: 02/29/2024 Medical Rec #:  [id number]     Height:       67.0 in Accession #:    [id number]    Weight:       289.9 lb Date of Birth:  03/03/1971     BSA:          2.368 m Patient Age:    53 years      BP:           114/66 mmHg Patient Gender: M             HR:           88  bpm. Exam Location:  [facility] Procedure: 2D Echo, Cardiac Doppler, Color Doppler and Saline Contrast Bubble            Study (Both Spectral and Color Flow Doppler were utilized during            procedure). Indications:     Pulmonary hypertension  History:         Patient has prior history of Echocardiogram examinations, most                  recent 02/21/2024. CHF; Risk Factors:Diabetes and Hypertension.  Sonographer:     Christopher Furnace Referring Phys:  [id number] [facility] Diagnosing Phys: Keller Alluri IMPRESSIONS  1. Technically difficult study.  2. Left ventricular ejection fraction, by estimation, is 55 to 60%. The left ventricle has normal function. Left ventricular endocardial border not optimally defined to evaluate regional wall motion. There is mild left ventricular hypertrophy. Left ventricular diastolic parameters are consistent with Grade I diastolic dysfunction (impaired relaxation). There is the interventricular septum is flattened in systole and diastole, consistent with right ventricular pressure and volume overload.  3. Right ventricular systolic function is mildly reduced. The right ventricular size is moderately enlarged. Tricuspid regurgitation signal is inadequate for assessing PA pressure.  4. The mitral valve is normal in structure. Trivial mitral valve regurgitation.  5. There is mild thickening of the aortic valve. Aortic valve regurgitation is not  visualized.  6. Agitated saline contrast bubble study was negative, with no evidence of any interatrial shunt. FINDINGS  Left Ventricle: Left ventricular ejection fraction, by estimation, is 55 to 60%. The left ventricle has normal function. Left ventricular endocardial border not optimally defined to evaluate regional wall motion. The left ventricular internal cavity size was normal in size. There is mild left  ventricular hypertrophy. The interventricular septum is flattened in systole and diastole, consistent with right ventricular pressure and volume overload. Left ventricular diastolic parameters are consistent with Grade I diastolic dysfunction (impaired relaxation). Right Ventricle: The right ventricular size is moderately enlarged. No increase in right ventricular wall thickness. Right ventricular systolic function is mildly reduced. Tricuspid regurgitation signal is inadequate for assessing PA pressure. Left Atrium: Left atrial size was normal in size. Right Atrium: Right atrial size was normal in size. Pericardium: There is no evidence of pericardial effusion. Mitral Valve: The mitral valve is normal in structure. Trivial mitral valve regurgitation. MV peak gradient, 4.6 mmHg. The mean mitral valve gradient is 3.0 mmHg. Tricuspid Valve: The tricuspid valve is normal in structure. Tricuspid valve regurgitation is trivial. Aortic Valve: There is mild thickening of the aortic valve. Aortic valve regurgitation is not visualized. Aortic valve mean gradient measures 6.0 mmHg. Aortic valve peak gradient measures 10.4 mmHg. Aortic valve area, by VTI measures 2.75 cm. Pulmonic Valve: The pulmonic valve was not well visualized. Pulmonic valve regurgitation is not visualized. Aorta: The aortic root is normal in size and structure. IAS/Shunts: The atrial septum is grossly normal. Agitated saline contrast was given intravenously to evaluate for intracardiac shunting. Agitated saline contrast bubble study was negative,  with no evidence of any interatrial shunt.  LEFT VENTRICLE PLAX 2D LVIDd:         3.50 cm   Diastology LVIDs:         1.60 cm   LV e' medial:    5.77 cm/s LV PW:         0.90 cm   LV E/e' medial:  13.4 LV IVS:        1.20 cm   LV e' lateral:   16.00 cm/s LVOT diam:     2.10 cm   LV E/e' lateral: 4.8 LV SV:         75 LV SV Index:   32 LVOT Area:     3.46 cm  RIGHT VENTRICLE RV Basal diam:  4.70 cm RV Mid diam:    3.90 cm RV S prime:     11.10 cm/s TAPSE (M-mode): 2.1 cm LEFT ATRIUM             Index        RIGHT ATRIUM           Index LA diam:        2.80 cm 1.18 cm/m   RA Area:     14.90 cm LA Vol (A2C):   37.4 ml 15.79 ml/m  RA Volume:   37.10 ml  15.67 ml/m LA Vol (A4C):   36.3 ml 15.33 ml/m LA Biplane Vol: 37.6 ml 15.88 ml/m  AORTIC VALVE AV Area (Vmax):    2.65 cm AV Area (Vmean):   2.25 cm AV Area (VTI):     2.75 cm AV Vmax:           161.00 cm/s AV Vmean:          115.000 cm/s AV VTI:            0.272 m AV Peak Grad:      10.4 mmHg AV Mean Grad:      6.0 mmHg LVOT Vmax:         123.00 cm/s LVOT Vmean:        74.600 cm/s LVOT VTI:          0.216 m LVOT/AV VTI ratio: 0.79  AORTA Ao Root  diam: 3.70 cm MITRAL VALVE               TRICUSPID VALVE MV Area (PHT): 3.76 cm    TR Peak grad:   33.4 mmHg MV Area VTI:   2.67 cm    TR Vmax:        289.00 cm/s MV Peak grad:  4.6 mmHg MV Mean grad:  3.0 mmHg    SHUNTS MV Vmax:       1.07 m/s    Systemic VTI:  0.22 m MV Vmean:      83.2 cm/s   Systemic Diam: 2.10 cm MV Decel Time: 202 msec MV E velocity: 77.60 cm/s MV A velocity: 90.70 cm/s MV E/A ratio:  0.86 Keller Alluri Electronically signed by Keller Paterson Signature Date/Time: 02/29/2024/8:52:22 AM    Final    CARDIAC CATHETERIZATION Result Date: 02/28/2024 HEMODYNAMICS: RA:       3 mmHg (mean) RV:       91/1, 3 mmHg PA:       97/22 mmHg (46 mean) PCWP: 10 mmHg (mean)    Estimated Fick CO/CI   7.56L/min, 2.93L/min/m2 Thermodilution CO/CI   6.48L/min, 2.51L/min/m2    TPG  36  mmHg     PVR  5.56 Wood Units  PAPi  25  IMPRESSION: Right heart catheterization for evaluation of known pulmonary hypertension on 0.25 of milrinone , 10mg  of macitentan, riociguat  2.5mg  TID Low filling pressures Severely elevated pulmonary pressures Significantly improved cardiac output on milrinone  support Significantly improved PVR RECOMMENDATIONS: Hypoxia not explained by worsening PH. Given significant improvement will wean milrinone  to 0.125mcg/kg/min Continue to hold diuretics   DG Chest Port 1 View Result Date: 02/22/2024 CLINICAL DATA:  200808 Hypoxia [id number] EXAM: PORTABLE CHEST - 1 VIEW COMPARISON:  02/20/2024 FINDINGS: Lungs are clear. Heart size and mediastinal contours are within normal limits. No effusion. Visualized bones unremarkable. Stable tunneled right IJ central venous catheter to the cavoatrial junction. IMPRESSION: No acute cardiopulmonary disease. Electronically Signed   By: JONETTA Faes M.D.   On: 02/22/2024 13:00   ECHOCARDIOGRAM LIMITED Result Date: 02/21/2024    ECHOCARDIOGRAM LIMITED REPORT   Patient Name:   Ryan [name] Date of Exam: 02/21/2024 Medical Rec #:  [id number]     Height:       67.0 in Accession #:    [id number]    Weight:       326.3 lb Date of Birth:  [birth year]     BSA:          2.490 m Patient Age:    53 years      BP:           64/78 mmHg Patient Gender: M             HR:           93 bpm. Exam Location:  [facility] Procedure: Limited Echo, Cardiac Doppler and Color Doppler (Both Spectral and            Color Flow Doppler were utilized during procedure). Indications:     Pulmonary hypertension I27.2  History:         Patient has prior history of Echocardiogram examinations, most                  recent 09/21/2023. CHF; Risk Factors:Hypertension.  Sonographer:     Christopher Furnace Referring Phys:  [id number] KHABIB DGAYLI Diagnosing Phys: Lonni Hanson MD  Sonographer Comments: Technically challenging study due to limited acoustic windows, no  apical window and no subcostal window. IMPRESSIONS  1. Left ventricular  ejection fraction, by estimation, is >55%. The left ventricle has normal function. Left ventricular endocardial border not optimally defined to evaluate regional wall motion. There is mild left ventricular hypertrophy. There is the interventricular septum is flattened in systole and diastole, consistent with right ventricular pressure and volume overload.  2. Right ventricular systolic function is moderately reduced. The right ventricular size is not well visualized. Tricuspid regurgitation signal is inadequate for assessing PA pressure.  3. The mitral valve was not well visualized.  4. The aortic valve has an indeterminant number of cusps. FINDINGS  Left Ventricle: Left ventricular ejection fraction, by estimation, is >55%. The left ventricle has normal function. Left ventricular endocardial border not optimally defined to evaluate regional wall motion. The left ventricular internal cavity size was  normal in size. There is mild left ventricular hypertrophy. The interventricular septum is flattened in systole and diastole, consistent with right ventricular pressure and volume overload. Right Ventricle: The right ventricular size is not well visualized. No increase in right ventricular wall thickness. Right ventricular systolic function is moderately reduced. Tricuspid regurgitation signal is inadequate for assessing PA pressure. Pericardium: The pericardium was not well visualized. Mitral Valve: The mitral valve was not well visualized. Tricuspid Valve: The tricuspid valve is not well visualized. Aortic Valve: The aortic valve has an indeterminant number of cusps. Pulmonic Valve: The pulmonic valve was not well visualized. Pulmonic valve regurgitation is not visualized. No evidence of pulmonic stenosis. Aorta: The aortic root is normal in size and structure. Pulmonary Artery: The pulmonary artery is not well seen. Additional Comments: Spectral Doppler performed. Color Doppler performed.  LEFT VENTRICLE PLAX 2D  LVIDd:         4.10 cm LVIDs:         2.50 cm LV PW:         1.20 cm LV IVS:        0.90 cm LVOT diam:     2.00 cm LVOT Area:     3.14 cm  LEFT ATRIUM         Index LA diam:    2.30 cm 0.92 cm/m   AORTA Ao Root diam: 3.50 cm  SHUNTS Systemic Diam: 2.00 cm Lonni Hanson MD Electronically signed by Lonni Hanson MD Signature Date/Time: 02/21/2024/6:20:14 PM    Final    DG Chest Port 1 View Result Date: 02/20/2024 CLINICAL DATA:  Shortness of breath EXAM: PORTABLE CHEST 1 VIEW COMPARISON:  Chest radiograph dated 02/18/2024 FINDINGS: Lines/tubes: Right internal jugular venous catheter tip projects over the superior cavoatrial junction. Lungs: Mildly low lung volumes. Increased left perihilar interstitial and patchy opacities. Pleura: No pneumothorax or pleural effusion. Heart/mediastinum: Similar enlarged cardiomediastinal silhouette. Bones: No acute osseous abnormality. IMPRESSION: 1. Increased left perihilar interstitial and patchy opacities, which may represent pulmonary edema or infection. 2. Similar cardiomegaly. Electronically Signed   By: Limin  Xu M.D.   On: 02/20/2024 16:50   IR Fluoro Guide CV Line Right Result Date: 02/20/2024 INDICATION: Partially retracted tunneled dual lumen PICC line in the right IJ. Cough is been exposed. Planned exchange for a new working tunneled PICC line. EXAM: Venous access exchange MEDICATIONS: Ancef  3 gm IV; The antibiotic was administered within an appropriate time interval prior to skin puncture. ANESTHESIA/SEDATION: Moderate (conscious) sedation was employed during this procedure. A total of Versed  0 mg and Fentanyl  0 mcg was administered intravenously by the radiology nurse. Total intra-service moderate Sedation Time: 0 minutes.  The patient's level of consciousness and vital signs were monitored continuously by radiology nursing throughout the procedure under my direct supervision. FLUOROSCOPY: Radiation Exposure Index (as provided by the fluoroscopic device): 0.1  minute 1 mGy Kerma COMPLICATIONS: None immediate. PROCEDURE: Informed written consent was obtained from the patient after a thorough discussion of the procedural risks, benefits and alternatives. All questions were addressed. Maximal Sterile Barrier Technique was utilized including caps, mask, sterile gowns, sterile gloves, sterile drape, hand hygiene and skin antiseptic. A timeout was performed prior to the initiation of the procedure. Initial fluoroscopic images of the patient in a supine position demonstrated the catheter to be partially retracted within the venous system. One of the 2 lumens in this dual lumen PICC line would not aspirate. The retention suture and catheter were cut. An 018 guidewire then advanced through the remaining PICC line and the PICC line was removed from the patient. The PICC line was examined and had a length of 25 cm. The new dual lumen tunneled PICC line was cut to 25 cm and advanced over the guidewire under fluoroscopic guidance until the catheter tip was at the cavoatrial junction. The guidewire was removed. Retention suture applied. The catheter was tested for function and found to be functioning well. Both lumens were then capped. IMPRESSION: Successful fluoroscopic guided exchange of a tunneled dual lumen PICC line the right IJ as described above. Electronically Signed   By: Cordella Banner   On: 02/20/2024 13:12   US  RENAL Result Date: 02/18/2024 CLINICAL DATA:  AKI. EXAM: RENAL / URINARY TRACT ULTRASOUND COMPLETE COMPARISON:  04/06/2022. FINDINGS: Right Kidney: Renal measurements: 11.2 x 5.2 x 5.5 cm = volume: 166.6 mL. Increased. No mass or hydronephrosis visualized. Left Kidney: Renal measurements: 11.3 x 6.0 x 7.9 cm = volume: 173.2 mL. Echogenicity within normal limits. No mass or hydronephrosis visualized. Bladder: Appears normal for degree of bladder distention. Other: Images are suboptimal due to bowel gas and patient's body habitus. IMPRESSION: 1. Increased renal  echogenicity on the right, may be associated with underlying medical renal disease. 2. No hydronephrosis bilaterally. 3. Normal bladder. Electronically Signed   By: Leita Birmingham M.D.   On: 02/18/2024 15:48   DG Chest Port 1 View Result Date: 02/18/2024 CLINICAL DATA:  Vascular catheter EXAM: PORTABLE CHEST 1 VIEW COMPARISON:  Chest radiograph dated 02/17/2024 FINDINGS: Lines/tubes: Right internal jugular venous catheter has been repositioned and tip projects over the SVC. Lungs: Well inflated lungs. Chronic-appearing bilateral perihilar vascular congestion. Pleura: No pneumothorax or pleural effusion. Heart/mediastinum: Similar mildly enlarged cardiomediastinal silhouette. Bones: No acute osseous abnormality. IMPRESSION: 1. Right internal jugular venous catheter has been repositioned and tip projects over the SVC. No pneumothorax. 2. Chronic-appearing bilateral perihilar vascular congestion. Electronically Signed   By: Limin  Xu M.D.   On: 02/18/2024 12:09   US  EKG SITE RITE Result Date: 02/17/2024 If Site Rite image not attached, placement could not be confirmed due to current cardiac rhythm.  DG Chest Portable 1 View Result Date: 02/17/2024 CLINICAL DATA:  Dyspnea and cough EXAM: PORTABLE CHEST 1 VIEW COMPARISON:  09/26/2023 chest radiograph. FINDINGS: Right internal jugular central venous catheter loops within and terminates within the right brachiocephalic vein, with the tip oriented superiorly, new from prior radiograph. Stable cardiomediastinal silhouette with top normal heart size and with prominent main pulmonary artery contour. No pneumothorax. No pleural effusion. No overt pulmonary edema. No acute consolidative airspace disease. IMPRESSION: 1. Right internal jugular central venous catheter loops within and terminates within the right  brachiocephalic vein, with the tip oriented superiorly, new from prior radiograph. 2. No acute cardiopulmonary disease. 3. Chronic prominent main pulmonary artery  contour suggesting dilated pulmonary artery and pulmonary hypertension. Electronically Signed   By: Selinda DELENA Blue M.D.   On: 02/17/2024 12:38   CT Angio Chest PE W/Cm &/Or Wo Cm Result Date: 02/17/2024 CLINICAL DATA:  Pulmonary embolism (PE) suspected, high prob, dyspnea, history of chronic pulmonary embolism EXAM: CT ANGIOGRAPHY CHEST WITH CONTRAST TECHNIQUE: Multidetector CT imaging of the chest was performed using the standard protocol during bolus administration of intravenous contrast. Multiplanar CT image reconstructions and MIPs were obtained to evaluate the vascular anatomy. RADIATION DOSE REDUCTION: This exam was performed according to the departmental dose-optimization program which includes automated exposure control, adjustment of the mA and/or kV according to patient size and/or use of iterative reconstruction technique. CONTRAST:  60mL OMNIPAQUE  IOHEXOL  350 MG/ML SOLN COMPARISON:  10/16/2023 chest CT angiogram and Chest radiograph from earlier today. FINDINGS: Cardiovascular: The study is moderate quality for the evaluation of pulmonary embolism, limited by some motion degradation. Chronic eccentric nonobstructive thrombus in the anterior right pulmonary artery extending into the lobar branches, not appreciably changed. Chronic web-like filling defects in the bilateral lower lobe pulmonary artery branches, not appreciably changed. No acute pulmonary artery filling defects. Atherosclerotic nonaneurysmal thoracic aorta. Dilated main pulmonary artery (3.9 cm diameter). Borderline mild cardiomegaly. No significant pericardial fluid/thickening. Right internal jugular central venous catheter loops in and terminates within the right brachiocephalic vein. Mediastinum/Nodes: No significant thyroid  nodules. Unremarkable esophagus. No pathologically enlarged axillary, mediastinal or hilar lymph nodes. Lungs/Pleura: No pneumothorax. No pleural effusion. No acute consolidative airspace disease, lung masses or  significant pulmonary nodules. Upper abdomen: Small hiatal hernia. IVC filter partially visualized in the suprarenal IVC, unchanged. Musculoskeletal: No aggressive appearing focal osseous lesions. Extensive subcutaneous varices noted throughout the bilateral ventral chest wall, unchanged. Moderate thoracic spondylosis. Review of the MIP images confirms the above findings. IMPRESSION: 1. No acute pulmonary embolism. 2. 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 3. Borderline mild cardiomegaly. 4. Small hiatal hernia. 5. Right internal jugular central venous catheter loops in and terminates within the right brachiocephalic vein. 6. 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 7.  Aortic Atherosclerosis (ICD10-I70.0). Electronically Signed   By: Selinda DELENA Blue M.D.   On: 02/17/2024 12:37   CT Head Wo Contrast Result Date: 02/17/2024 EXAM: CT HEAD WITHOUT CONTRAST 02/17/2024 12:17:03 PM TECHNIQUE: CT of the head was performed without the administration of intravenous contrast. Automated exposure control, iterative reconstruction, and/or weight based adjustment of the mA/kV was utilized to reduce the radiation dose to as low as reasonably achievable. COMPARISON: 07/08/2004. CLINICAL HISTORY: Mental status change, unknown cause. Best possible. Unable to hold still due to difficulty breathing. Repeat head one to try for less motion. FINDINGS: BRAIN AND VENTRICLES: No acute hemorrhage. Gray-white differentiation is preserved. No hydrocephalus. No extra-axial collection. No mass effect or midline shift. ORBITS: No acute abnormality. SINUSES: No acute abnormality. SOFT TISSUES AND SKULL: No acute soft tissue abnormality. No skull fracture. IMPRESSION: 1. No acute intracranial abnormality. Electronically signed by: Ryan Chess MD 02/17/2024 12:25 PM EDT RP Workstation: [id number]    Microbiology: Results for  orders placed or performed during the hospital encounter of 02/17/24  Culture, blood (routine x 2)     Status: Abnormal   Collection Time: 02/17/24 10:37 AM   Specimen: BLOOD  Result Value Ref Range Status   Specimen Description  Final    BLOOD RIGHT ANTECUBITAL Performed at [facility], [address] [place], [place] [address]    Special Requests   Final    BOTTLES DRAWN AEROBIC AND ANAEROBIC Blood Culture adequate volume Performed at [facility], [address] [place], [place] [address]    Culture  Setup Time   Final    Organism ID to follow IN BOTH AEROBIC AND ANAEROBIC BOTTLES GRAM POSITIVE COCCI CRITICAL RESULT CALLED TO, READ BACK BY AND VERIFIED WITH: JASON ROBBINS @ 02/18/24 0221 AB Performed at [facility], [address] [place], [place] [address]    Culture STAPHYLOCOCCUS EPIDERMIDIS (A)  Final   Report Status 02/22/2024 FINAL  Final   Organism ID, Bacteria STAPHYLOCOCCUS EPIDERMIDIS  Final      Susceptibility   Staphylococcus epidermidis - MIC*    CIPROFLOXACIN <=0.5 SENSITIVE Sensitive     ERYTHROMYCIN <=0.25 SENSITIVE Sensitive     GENTAMICIN <=0.5 SENSITIVE Sensitive     OXACILLIN >=4 RESISTANT Resistant     TETRACYCLINE <=1 SENSITIVE Sensitive     VANCOMYCIN  1 SENSITIVE Sensitive     TRIMETH/SULFA <=10 SENSITIVE Sensitive     CLINDAMYCIN <=0.25 SENSITIVE Sensitive     RIFAMPIN <=0.5 SENSITIVE Sensitive     Inducible Clindamycin NEGATIVE Sensitive     * STAPHYLOCOCCUS EPIDERMIDIS  Blood Culture ID Panel (Reflexed)     Status: Abnormal   Collection Time: 02/17/24 10:37 AM  Result Value Ref Range Status   Enterococcus faecalis NOT DETECTED NOT DETECTED Final   Enterococcus Faecium NOT DETECTED NOT DETECTED Final   Listeria monocytogenes NOT DETECTED NOT DETECTED Final   Staphylococcus species DETECTED (A) NOT DETECTED Final    Comment: CRITICAL RESULT CALLED TO, READ BACK BY AND VERIFIED WITH: JASON ROBBINS @ 02/18/24  0221 AB    Staphylococcus aureus (BCID) NOT DETECTED NOT DETECTED Final   Staphylococcus epidermidis DETECTED (A) NOT DETECTED Final    Comment: Methicillin (oxacillin) resistant coagulase negative staphylococcus. Possible blood culture contaminant (unless isolated from more than one blood culture draw or clinical case suggests pathogenicity). No antibiotic treatment is indicated for blood  culture contaminants. CRITICAL RESULT CALLED TO, READ BACK BY AND VERIFIED WITH: JASON ROBBINS @ 02/18/24 0221 AB    Staphylococcus lugdunensis NOT DETECTED NOT DETECTED Final   Streptococcus species NOT DETECTED NOT DETECTED Final   Streptococcus agalactiae NOT DETECTED NOT DETECTED Final   Streptococcus pneumoniae NOT DETECTED NOT DETECTED Final   Streptococcus pyogenes NOT DETECTED NOT DETECTED Final   A.calcoaceticus-baumannii NOT DETECTED NOT DETECTED Final   Bacteroides fragilis NOT DETECTED NOT DETECTED Final   Enterobacterales NOT DETECTED NOT DETECTED Final   Enterobacter cloacae complex NOT DETECTED NOT DETECTED Final   Escherichia coli NOT DETECTED NOT DETECTED Final   Klebsiella aerogenes NOT DETECTED NOT DETECTED Final   Klebsiella oxytoca NOT DETECTED NOT DETECTED Final   Klebsiella pneumoniae NOT DETECTED NOT DETECTED Final   Proteus species NOT DETECTED NOT DETECTED Final   Salmonella species NOT DETECTED NOT DETECTED Final   Serratia marcescens NOT DETECTED NOT DETECTED Final   Haemophilus influenzae NOT DETECTED NOT DETECTED Final   Neisseria meningitidis NOT DETECTED NOT DETECTED Final   Pseudomonas aeruginosa NOT DETECTED NOT DETECTED Final   Stenotrophomonas maltophilia NOT DETECTED NOT DETECTED Final   Candida albicans NOT DETECTED NOT DETECTED Final   Candida auris NOT DETECTED NOT DETECTED Final   Candida glabrata NOT DETECTED NOT DETECTED Final   Candida krusei NOT DETECTED NOT DETECTED  Final   Candida parapsilosis NOT DETECTED NOT DETECTED Final   Candida tropicalis NOT  DETECTED NOT DETECTED Final   Cryptococcus neoformans/gattii NOT DETECTED NOT DETECTED Final   Methicillin resistance mecA/C DETECTED (A) NOT DETECTED Final    Comment: CRITICAL RESULT CALLED TO, READ BACK BY AND VERIFIED WITH: JASON ROBBINS @ 02/18/24 0221 AB Performed at [facility], [address] [place], [place] [address]   Resp panel by RT-PCR (RSV, Flu A&B, Covid) Anterior Nasal Swab     Status: None   Collection Time: 02/17/24 12:22 PM   Specimen: Anterior Nasal Swab  Result Value Ref Range Status   SARS Coronavirus 2 by RT PCR NEGATIVE NEGATIVE Final    Comment: (NOTE) SARS-CoV-2 target nucleic acids are NOT DETECTED.  The SARS-CoV-2 RNA is generally detectable in upper respiratory specimens during the acute phase of infection. The lowest concentration of SARS-CoV-2 viral copies this assay can detect is 138 copies/mL. A negative result does not preclude SARS-Cov-2 infection and should not be used as the sole basis for treatment or other patient management decisions. A negative result may occur with  improper specimen collection/handling, submission of specimen other than nasopharyngeal swab, presence of viral mutation(s) within the areas targeted by this assay, and inadequate number of viral copies(<138 copies/mL). A negative result must be combined with clinical observations, patient history, and epidemiological information. The expected result is Negative.  Fact Sheet for Patients:  BloggerCourse.com  Fact Sheet for Healthcare Providers:  SeriousBroker.it  This test is no t yet approved or cleared by the [place]  FDA and  has been authorized for detection and/or diagnosis of SARS-CoV-2 by FDA under an Emergency Use Authorization (EUA). This EUA will remain  in effect (meaning this test can be used) for the duration of the COVID-19 declaration under Section 564(b)(1) of the Act, 21 U.S.C.section  360bbb-3(b)(1), unless the authorization is terminated  or revoked sooner.       Influenza A by PCR NEGATIVE NEGATIVE Final   Influenza B by PCR NEGATIVE NEGATIVE Final    Comment: (NOTE) The Xpert Xpress SARS-CoV-2/FLU/RSV plus assay is intended as an aid in the diagnosis of influenza from Nasopharyngeal swab specimens and should not be used as a sole basis for treatment. Nasal washings and aspirates are unacceptable for Xpert Xpress SARS-CoV-2/FLU/RSV testing.  Fact Sheet for Patients: BloggerCourse.com  Fact Sheet for Healthcare Providers: SeriousBroker.it  This test is not yet approved or cleared by the [place]  FDA and has been authorized for detection and/or diagnosis of SARS-CoV-2 by FDA under an Emergency Use Authorization (EUA). This EUA will remain in effect (meaning this test can be used) for the duration of the COVID-19 declaration under Section 564(b)(1) of the Act, 21 U.S.C. section 360bbb-3(b)(1), unless the authorization is terminated or revoked.     Resp Syncytial Virus by PCR NEGATIVE NEGATIVE Final    Comment: (NOTE) Fact Sheet for Patients: BloggerCourse.com  Fact Sheet for Healthcare Providers: SeriousBroker.it  This test is not yet approved or cleared by the [place]  FDA and has been authorized for detection and/or diagnosis of SARS-CoV-2 by FDA under an Emergency Use Authorization (EUA). This EUA will remain in effect (meaning this test can be used) for the duration of the COVID-19 declaration under Section 564(b)(1) of the Act, 21 U.S.C. section 360bbb-3(b)(1), unless the authorization is terminated or revoked.  Performed at [facility], [address] [place], [place] [address]   Culture, blood (routine x 2)  Status: None   Collection Time: 02/17/24  7:19 PM   Specimen: BLOOD  Result Value Ref Range Status   Specimen  Description BLOOD BLOOD RIGHT ARM  Final   Special Requests   Final    BOTTLES DRAWN AEROBIC AND ANAEROBIC Blood Culture results may not be optimal due to an inadequate volume of blood received in culture bottles   Culture   Final    NO GROWTH 5 DAYS Performed at [facility], [address], [place], [place] [address]    Report Status 02/22/2024 FINAL  Final  Culture, blood (Routine X 2) w Reflex to ID Panel     Status: None   Collection Time: 02/18/24  9:11 AM   Specimen: BLOOD  Result Value Ref Range Status   Specimen Description BLOOD BLOOD RIGHT ARM  Final   Special Requests   Final    BOTTLES DRAWN AEROBIC AND ANAEROBIC Blood Culture adequate volume   Culture   Final    NO GROWTH 5 DAYS Performed at [facility], [address] [place], [place] [address]    Report Status 02/23/2024 FINAL  Final  Culture, blood (Routine X 2) w Reflex to ID Panel     Status: None   Collection Time: 02/18/24  9:18 AM   Specimen: BLOOD  Result Value Ref Range Status   Specimen Description BLOOD BLOOD RIGHT FOREARM  Final   Special Requests   Final    BOTTLES DRAWN AEROBIC AND ANAEROBIC Blood Culture adequate volume   Culture   Final    NO GROWTH 5 DAYS Performed at [facility], [address] [place], [place] [address]    Report Status 02/23/2024 FINAL  Final  MRSA Next Gen by PCR, Nasal     Status: None   Collection Time: 02/18/24  9:41 AM   Specimen: Nasal Mucosa; Nasal Swab  Result Value Ref Range Status   MRSA by PCR Next Gen NOT DETECTED NOT DETECTED Final    Comment: (NOTE) The GeneXpert MRSA Assay (FDA approved for NASAL specimens only), is one component of a comprehensive MRSA colonization surveillance program. It is not intended to diagnose MRSA infection nor to guide or monitor treatment for MRSA infections. Test performance is not FDA approved in patients less than 39 years old. Performed at [facility], [address]  [place], [place] [address]   C Difficile Quick Screen w PCR reflex     Status: Abnormal   Collection Time: 02/18/24  1:40 PM   Specimen: STOOL  Result Value Ref Range Status   C Diff antigen POSITIVE (A) NEGATIVE Final   C Diff toxin NEGATIVE NEGATIVE Final   C Diff interpretation Results are indeterminate. See PCR results.  Final    Comment: Performed at [facility], [address] [place], [place] [address]  Gastrointestinal Panel by PCR , Stool     Status: Abnormal   Collection Time: 02/18/24  1:40 PM   Specimen: Stool  Result Value Ref Range Status   Campylobacter species NOT DETECTED NOT DETECTED Final   Plesimonas shigelloides NOT DETECTED NOT DETECTED Final   Salmonella species NOT DETECTED NOT DETECTED Final   Yersinia enterocolitica NOT DETECTED NOT DETECTED Final   Vibrio species NOT DETECTED NOT DETECTED Final   Vibrio cholerae NOT DETECTED NOT DETECTED Final   Enteroaggregative E coli (EAEC) NOT DETECTED NOT DETECTED Final   Enteropathogenic E coli (EPEC) DETECTED (A) NOT DETECTED Final    Comment: RESULT CALLED  TO, READ BACK BY AND VERIFIED WITH: LASHARA SINGLETARY AT 1608 02/18/24.PMF    Enterotoxigenic E coli (ETEC) NOT DETECTED NOT DETECTED Final   Shiga like toxin producing E coli (STEC) NOT DETECTED NOT DETECTED Final   Shigella/Enteroinvasive E coli (EIEC) NOT DETECTED NOT DETECTED Final   Cryptosporidium NOT DETECTED NOT DETECTED Final   Cyclospora cayetanensis NOT DETECTED NOT DETECTED Final   Entamoeba histolytica NOT DETECTED NOT DETECTED Final   Giardia lamblia NOT DETECTED NOT DETECTED Final   Adenovirus F40/41 NOT DETECTED NOT DETECTED Final   Astrovirus NOT DETECTED NOT DETECTED Final   Norovirus GI/GII NOT DETECTED NOT DETECTED Final   Rotavirus A NOT DETECTED NOT DETECTED Final   Sapovirus (I, II, IV, and V) NOT DETECTED NOT DETECTED Final    Comment: Performed at [facility], [address], [place], [place] [address]  C. Diff by  PCR, Reflexed     Status: Abnormal   Collection Time: 02/18/24  1:40 PM  Result Value Ref Range Status   Toxigenic C. Difficile by PCR POSITIVE (A) NEGATIVE Final    Comment: Positive for toxigenic C. difficile with little to no toxin production. Only treat if clinical presentation suggests symptomatic illness. Performed at [facility], [address] [place], [place] [address]   Culture, blood (Routine X 2) w Reflex to ID Panel     Status: None   Collection Time: 02/21/24  6:45 AM   Specimen: BLOOD RIGHT HAND  Result Value Ref Range Status   Specimen Description BLOOD RIGHT HAND  Final   Special Requests   Final    BOTTLES DRAWN AEROBIC AND ANAEROBIC Blood Culture adequate volume   Culture   Final    NO GROWTH 5 DAYS Performed at [facility], [address] [place], [place] [address]    Report Status 02/26/2024 FINAL  Final  Culture, blood (Routine X 2) w Reflex to ID Panel     Status: None   Collection Time: 02/21/24  6:46 AM   Specimen: BLOOD RIGHT HAND  Result Value Ref Range Status   Specimen Description BLOOD RIGHT HAND  Final   Special Requests   Final    BOTTLES DRAWN AEROBIC AND ANAEROBIC Blood Culture adequate volume   Culture   Final    NO GROWTH 5 DAYS Performed at [facility], [address] [place], [place] [address]    Report Status 02/26/2024 FINAL  Final  Culture, blood (Routine X 2) w Reflex to ID Panel     Status: None (Preliminary result)   Collection Time: 02/27/24  2:07 PM   Specimen: BLOOD  Result Value Ref Range Status   Specimen Description BLOOD BLOOD LEFT HAND  Final   Special Requests   Final    BOTTLES DRAWN AEROBIC AND ANAEROBIC Blood Culture results may not be optimal due to an inadequate volume of blood received in culture bottles   Culture   Final    NO GROWTH 2 DAYS Performed at [facility], [address], [place], [place] [address]    Report Status PENDING  Incomplete  Culture, blood (Routine  X 2) w Reflex to ID Panel     Status: None (Preliminary result)   Collection Time: 02/27/24  2:47 PM   Specimen: BLOOD  Result Value Ref Range Status   Specimen Description BLOOD BLOOD LEFT HAND  Final   Special Requests   Final    BOTTLES DRAWN AEROBIC AND ANAEROBIC Blood Culture adequate volume  Culture   Final    NO GROWTH 2 DAYS Performed at [facility], [address] [place], [place] [address]    Report Status PENDING  Incomplete    Labs: CBC: Recent Labs  Lab 02/24/24 0510 02/25/24 0526 02/26/24 0543 02/27/24 1330 02/28/24 1526 02/28/24 1527 02/29/24 0357  WBC 4.1 4.3 5.8 10.1  --   --  7.9  NEUTROABS  --   --   --  7.6  --   --  5.8  HGB 13.2 13.6 13.7 13.0 13.9 14.3 12.7*  HCT 42.0 43.0 43.1 41.5 41.0 42.0 40.9  MCV 86.4 86.5 85.9 85.2  --   --  86.3  PLT 312 287 336 341  --   --  365   Basic Metabolic Panel: Recent Labs  Lab 02/24/24 0510 02/25/24 0525 02/26/24 0543 02/27/24 0545 02/28/24 0437 02/28/24 1526 02/28/24 1527 02/29/24 0357  NA 138 135 135 135 137 135 136 135  K 3.8 4.0 4.3 4.5 4.5 4.4 4.5 4.4  CL 99 96* 96* 95* 96*  --   --  97*  CO2 28 28 29 28 30   --   --  26  GLUCOSE 115* 163* 118* 122* 121*  --   --  112*  BUN 25* 26* 30* 33* 35*  --   --  34*  CREATININE 1.69* 1.80* 2.09* 2.31* 2.15*  --   --  2.09*  CALCIUM  9.2 9.2 9.3 9.2 9.2  --   --  9.2  MG 2.2 1.9 2.1  --  2.2  --   --   --   PHOS  --   --  4.2  --  3.6  --   --   --    Liver Function Tests: Recent Labs  Lab 02/24/24 1445 02/27/24 0544  AST 71* 42*  ALT 54* 46*  ALKPHOS 45 49  BILITOT 1.0 1.2  PROT 7.6 7.6  ALBUMIN  3.5 3.5   CBG: Recent Labs  Lab 02/28/24 1211 02/28/24 1608 02/28/24 2141 02/29/24 0813 02/29/24 1144  GLUCAP 127* 110* 141* 108* 143*    Discharge time spent: greater than 30 minutes.  Signed: Leita Blanch, MD [facility] 02/29/2024

## 2024-02-29 NOTE — Progress Notes (Signed)
 Pre and Post spirometry completed at bedside. Please see chart for study print out.

## 2024-02-29 NOTE — Consult Note (Signed)
 Pharmacy Consult Note - Anticoagulation  Pharmacy Consult for warfarin Indication: CTEPH and atrial fibrillation  PATIENT MEASUREMENTS: Height: 5' 7 (170.2 cm) Weight: 131.5 kg (289 lb 14.5 oz) IBW/kg (Calculated) : 66.1 HEPARIN  DW (KG): 104.5  VITAL SIGNS: BP: 107/64 (07/02 0900) Pulse Rate: 98 (07/02 0905)  Recent Labs    02/29/24 0357  HGB 12.7*  HCT 40.9  PLT 365  LABPROT 32.2*  INR 3.0*  CREATININE 2.09*    Estimated Creatinine Clearance: 52.7 mL/min (A) (by C-G formula based on SCr of 2.09 mg/dL (H)).  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  Regular the workup has not been forthcoming) previously followed by Dr. Darcy at [facility]   Hepatic steatosis    Hypertension    Morbid obesity with BMI of 50.0-59.9, adult (HCC)    Oxygen deficiency    Peripheral vascular disease (HCC)    Presence of IVC filter    Has 2 IVC filters with significant thrombus burden superior to the filter.   Pulmonary embolus (HCC) 12/2016   Initially just treated with anticoagulation, but in February 2023 in April 2024 treated with EKOS thrombectomy for submassive PE.   Ventricular trigeminy    Weakness of both lower limbs     ASSESSMENT: 53 y.o. 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 Patient is on chronic anticoagulation per chart review. Has been on Xarelto  in the past but is currently on warfarin with INR goal 2.5-3.5. Pharmacy has been consulted to manage  warfarin while patient is admitted. INR currently subtherapeutic; no known missed doses however, per wife, medication adherence overall is question. Patient is receiving enoxaparin  until INR is therapeutic.  Pertinent medications: Warfarin 6mg  on Tuesday, Thursday, Sunday Warfarin 5mg  all other days Has 5mg  and 1mg  tablets at home Last dose 6/19 at bedtime Total weekly warfarin = 38mg   Goal(s) of therapy: INR 2.5 - 3.5 Monitor platelets by anticoagulation protocol: Yes   Date:  INR: Dose: 6/20 1.6 8 mg 6/21 2.0 8 mg 6/22 2.2 6 mg  6/23 2.0 8 mg  6/24  1.9 10 mg 6/25 2.1 10 mg 6/26 2.5 8 mg 6/27 2.6 8 mg 6/28 2.9 6 mg  6/29 3.0 5 mg 6/30 3.2 5 mg 7/1 3.0 6 mg 7/2 3.0  PLAN: - INR looks to have stabilized - Will continue home dose of warfarin(5 mg today) - Daily INR and CBC at least every 3 days.   Kipp Shank A Tashira Torre, PharmD Clinical Pharmacist 02/29/2024 9:36 AM

## 2024-02-29 NOTE — Progress Notes (Signed)
 Physical Therapy Treatment Patient Details Name: Ryan [name] MRN: [id number] DOB: [birth year] Today's Date: 02/29/2024   History of Present Illness 53 y/o male presented to ED on 02/17/24 for worsening SOB and cough along with syncope. Admitted for suspected cor pulmonale and pulmonary HTN. 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  Pt on 15L O2 t/o the session, but ability to maintain SpO2 in the 90s during multiple small ambulation loops into the hallway.  Good overall effort, able to tolerate much more distance and overall activity today though remains far from his baseline.  Will benefit from ongoing PT, continue with POC.      If plan is discharge home, recommend the following: A little help with walking and/or transfers;A little help with bathing/dressing/bathroom;Assistance with cooking/housework;Assist for transportation;Help with stairs or ramp for entrance   Can travel by private vehicle        Equipment Recommendations  Other (comment)    Recommendations for Other Services       Precautions / Restrictions Precautions Precautions: Fall Recall of Precautions/Restrictions: Intact Restrictions Weight Bearing Restrictions Per Provider Order: No     Mobility  Bed Mobility Overal bed mobility: Needs Assistance Bed Mobility: Supine to Sit     Supine to sit: Supervision     General bed mobility comments: assist with lines/leads/sheets/etc but pt able to phyiscally perform w/o assist    Transfers Overall transfer level: Needs assistance Equipment used: Rolling walker (2 wheels) Transfers: Sit to/from Stand Sit to Stand: Supervision           General transfer comment: Pt needing forward lean and heavy UE use, but able to rise to standing w/o phyiscal assist     Ambulation/Gait Ambulation/Gait assistance: Contact guard assist Gait Distance (Feet): 70 Feet Assistive device: Rolling walker (2 wheels) Gait Pattern/deviations: Step-to pattern       General Gait Details: Pt not overly reliant on the walker, cuing t/o for breathing and energy conservation.  Pt on 15L O2 t/o the effort with SpO2 dropping from high to low 90s, but able to tolerate more prolonged effort w/o direct assist   Stairs             Wheelchair Mobility     Tilt Bed    Modified Rankin (Stroke Patients Only)       Balance     Sitting balance-Leahy Scale: Good       Standing balance-Leahy Scale: Good Standing balance comment: not overly reliant on use of RW, no LOB                            Communication Communication Communication: No apparent difficulties  Cognition Arousal: Alert Behavior During Therapy: WFL for tasks assessed/performed   PT - Cognitive impairments: No apparent impairments                         Following commands: Intact      Cueing    Exercises      General Comments General comments (skin integrity, edema, etc.): Pt able to maintain a more prolonged up time and did considerably more ambulation than prior session with good results and no safety or pulmonary red flags  Pertinent Vitals/Pain Pain Assessment Pain Assessment: No/denies pain    Home Living                          Prior Function            PT Goals (current goals can now be found in the care plan section) Progress towards PT goals: Progressing toward goals    Frequency    Min 2X/week      PT Plan      Co-evaluation              AM-PAC PT 6 Clicks Mobility   Outcome Measure  Help needed turning from your back to your side while in a flat bed without using bedrails?: None Help needed moving from lying on your back to sitting on the side of a flat bed without using bedrails?: None Help needed  moving to and from a bed to a chair (including a wheelchair)?: None Help needed standing up from a chair using your arms (e.g., wheelchair or bedside chair)?: None Help needed to walk in hospital room?: A Little Help needed climbing 3-5 steps with a railing? : A Little 6 Click Score: 22    End of Session Equipment Utilized During Treatment: Oxygen (15L) Activity Tolerance: Patient tolerated treatment well Patient left: in chair;with call bell/phone within reach;with nursing/sitter in room Nurse Communication: Mobility status PT Visit Diagnosis: Unsteadiness on feet (R26.81);Muscle weakness (generalized) (M62.81);Other abnormalities of gait and mobility (R26.89)     Time: 9040-8974 PT Time Calculation (min) (ACUTE ONLY): 26 min  Charges:    $Gait Training: 8-22 mins $Therapeutic Activity: 8-22 mins PT General Charges $$ ACUTE PT VISIT: 1 Visit                     Carmin JONELLE Deed, DPT 02/29/2024, 11:52 AM

## 2024-03-01 ENCOUNTER — Telehealth: Payer: Self-pay

## 2024-03-01 LAB — GLUCOSE, CAPILLARY: Glucose-Capillary: 112 mg/dL — ABNORMAL HIGH (ref 70–99)

## 2024-03-01 NOTE — Transitions of Care (Post Inpatient/ED Visit) (Signed)
   03/01/2024  Name: GERADO NABERS MRN: [id number] DOB: Aug 22, 1971  Today's TOC FU Call Status: Today's TOC FU Call Status:: Unsuccessful Call (1st Attempt) Unsuccessful Call (1st Attempt) Date: 03/01/24  Attempted to reach the patient regarding the most recent Inpatient/ED visit.  Follow Up Plan: Additional outreach attempts will be made to reach the patient to complete the Transitions of Care (Post Inpatient/ED visit) call.   Richerd Fish, RN, BSN, CCM [facility], [facility] RN Care Manager Direct Dial: [phone]

## 2024-03-01 NOTE — Telephone Encounter (Signed)
 FYI

## 2024-03-01 NOTE — Telephone Encounter (Signed)
 Copied from CRM [id number]. Topic: Clinical - Medical Advice >> Mar 01, 2024  4:49 PM DeAngela L wrote: Reason for CRM: patient is having pulmonary bi pass surgery and the patient would like to inform his doctor that this is scheduled for 03/05/24 at [facility] (he just want to make sure his provider knew he was having surgery Monday) Pt num [phone] (M)

## 2024-03-03 LAB — CULTURE, BLOOD (ROUTINE X 2)
Culture: NO GROWTH
Culture: NO GROWTH
Special Requests: ADEQUATE

## 2024-03-07 ENCOUNTER — Ambulatory Visit: Admitting: Internal Medicine

## 2024-03-08 ENCOUNTER — Ambulatory Visit: Admitting: Student in an Organized Health Care Education/Training Program

## 2024-03-09 ENCOUNTER — Telehealth: Payer: Self-pay

## 2024-03-15 NOTE — Discharge Summary (Signed)
 CTS Discharge Summary   Admit Date: 02/29/2024  Discharge Date: 03/23/2024  Admitting Physician: Arley Ozell Ramp, MD   Discharge Physician: Arnie Lang Dom, MD   Primary Care Physician: Melvin Darice CROME, NP  Referring Physician: Other  Admission Diagnoses:  CTEPH; HF  Discharge Diagnoses:  Principal Problem:   S/P PTE Active Problems:   Chronic diastolic heart failure (CMS/HHS-HCC)   DVT (deep venous thrombosis) (CMS/HHS-HCC)   HTN (hypertension)   Morbid obesity with BMI of 50.0-59.9, adult (CMS/HHS-HCC)   Pulmonary emboli (CMS/HHS-HCC)   Acute on chronic respiratory failure with hypoxia (CMS/HHS-HCC)   CTEPH (chronic thromboembolic pulmonary hypertension) (CMS/HHS-HCC)   Stage 3a chronic kidney disease (CMS/HHS-HCC)   OSA (obstructive sleep apnea)   Type 2 diabetes mellitus, without long-term current use of insulin  (CMS/HHS-HCC)   PAH (pulmonary artery hypertension) (CMS/HHS-HCC)   Acute postoperative pain   Postoperative anemia due to acute blood loss   Right ventricular dysfunction   Postoperative hypoxemia   Warfarin anticoagulation   Acute postoperative respiratory insufficiency   Chronic respiratory failure (CMS/HHS-HCC) Resolved Problems:   Leukocytosis   Hypoxemia requiring supplemental oxygen   Fever   Encounter for monitoring diuretic therapy   Receiving inotropic medication   Surgeries Performed: Pulmonary Endarterectomy on 03/06/24  Brief History of Present Illness: Per admission H&P dated 02/29/2024: Ryan [name] is a 53 y.o. 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 difficile infection who presented to an OSH with cough worsening shortness of breath with increased oxygen  requirement up to 15 L now transferred to [facility] for consideration of pulmonary angioplasty or endarterectomy.   [name] states he was admitted to [facility] on 02/17/24 after increased shortness of breath. After he got out of his car that day, he could not catch his breath. Typically, he experiences dyspnea with exertion but recovers after a few minutes. This episode was different in that he did not recover. He also experienced presyncope and some LE swelling. He denied syncope, PND, chest pain, and palpitations. He can lie flat at home. He has had an intermittent, nonproductive cough, but no rhinorrhea, congestion, or sick contacts. He noted that he was fasting for several days before this episode of shortness of breath, but was drinking adequate fluids. He denied significant bowel or bladder changes prior to admission.   At the OSH, he had an increased O2 requirement up to 10-15L [place]. At home, he uses 2L only at night. CTA chest on 6/20 redemonstrated chronic pulmonary embolism with no acute pulmonary embolism. His home milrinone  and riociguat  were continued (started 08/2023). Home dose of milrinone  was 0.25 mcg/kg/min, increased on presentation to 0.375. He was initially diuresed with several doses of 80 mg IV Lasix , then transitioned to torsemide  60 mg PO daily, which was discontinued before transfer to [facility]. He was started on spironolactone  25 mg daily on 6/24.  His PICC was exchanged on 6/23 due to malpositioning. Macitentan  added to regimen on 6/27. He had a RHC on 7/1 on 0.25 of milrinone , 10 mg macitentan , riociguat  2.5 mg TID with significant improvement in cardiac output and PVR with severely elevated pulmonary pressures. TTE with bubble study on 7/2 showed LVEF 55-60%, flattened interventricular septum in systole and diastole, moderately  enlarged RV and mildly reduced RV systolic function, and negative bubble study. Started BiPAP at bedtime and during naps on 7/2 per pulmonary recs.  Milrinone  dose upon transfer was 0.125 mcg/kg/min.   His admission was also complicated by acute gastroenteritis 2/2 E. Coli and C. Diff. He received vanc and cefepime  and was transitioned to oral vancomycin  for 10 days along with probiotics. 1/2 Bcx from 6/20 grew staph epidermidis, likely contaminant. Repeat cultures from 6/21 and 6/24 were negative. He also had an AKI (sCr 2 on admission from baseline 1.4). Renal US  ruled out hydronephrosis and bladder distention.   He was transferred to [facility] for further evaluation of CTEPH for consideration of pulmonary angioplasty or endarterectomy.   Upon arrival to [facility], he was in no acute distress and provided history as above. During our interview, he had several desats to the mid 80s and recovered spontaneously to low 90s with deep breathing on 10L [place]. He states that these desats were also occurring at the OSH spontaneously, not related to exertion. Admission labs notable for VBG 7.43/45/66, sCr 2.2, BUN 29, INR 3.1. EKG with NSR. CXR with prominent central pulmonary vasculature and no airspace opacity or focal consolidation. Right CVC with tip in lower SVC.  Past Medical History:  Diagnosis Date  . CHF (congestive heart failure) (CMS/HHS-HCC)   . CKD (chronic kidney disease), stage II   . Clotting disorder (CMS/HHS-HCC)   . Diabetes mellitus type 2, uncomplicated (CMS/HHS-HCC)   . DVT (deep venous thrombosis) (CMS/HHS-HCC)   . Hypertension   . Obesity   . PTE pulmonary thrombo embolectomy 03/06/2024   Performed at [facility] by Dr. Lang Salter   . Pulmonary embolism (CMS/HHS-HCC)   . PVD (peripheral vascular disease) ()    Past Surgical History:  Procedure Laterality Date  . REMOVAL BLOOD CLOT FROM ANTERIOR SEGMENT  12/2015  . ENDARTERECTOMY PULMONARY ARTERY N/A 03/06/2024   Procedure: PULMONARY ENDARTERECTOMY, WITH OR WITHOUT EMBOLECTOMY, WITH CARDIOPULMONARY BYPASS;  Surgeon: Salter Lang Dom, MD;  Location: [facility] OPERATING ROOMS;  Service:  Cardiothoracic;  Laterality: N/A;  . IVC FILTER PLACEMENT     Family History  Problem Relation Name Age of Onset  . Breast cancer Mother    . Cancer Father Moo    Social History   Socioeconomic History  . Marital status: Married  Tobacco Use  . Smoking status: Never  . Smokeless tobacco: Never  Vaping Use  . Vaping status: Never Used  Substance and Sexual Activity  . Alcohol use: No  . Drug use: No  . Sexual activity: Yes    Partners: Female   Social Drivers of Health   Financial Resource Strain: Patient Declined (03/01/2024)   Overall Financial Resource Strain (CARDIA)   . Difficulty of Paying Living Expenses: Patient declined  Food Insecurity: Patient Declined (03/01/2024)   Hunger Vital Sign   . Worried About Programme researcher, broadcasting/film/video in the Last Year: Patient declined   . Ran Out of Food in the Last Year: Patient declined  Transportation Needs: No Transportation Needs (03/01/2024)   PRAPARE - Transportation   . Lack of Transportation (Medical): No   . Lack of Transportation (Non-Medical): No  Recent Concern: Transportation Needs - Unmet Transportation Needs (02/17/2024)   Received from [facility] - Transportation   . In the past 12 months, has lack of transportation kept you from medical appointments or from getting medications?: Yes   . In the past 12 months, has lack of transportation kept you  from meetings, work, or from getting things needed for daily living?: Yes   Allergies  Allergen Reactions  . Jardiance  [Empagliflozin ] Other (See Comments)    GU infection  . Metrizamide Kidney Disorder    Kidney failure requiring dialysis  . Clindamycin Rash  . Lincomycin Rash  . Sulfa (Sulfonamide Antibiotics) Rash  . Sulfasalazine Rash   Prior to Admission Medications  Prescriptions Last Dose Taking?  MILrinone  in dextrose  5% (PRIMACOR ) 20 mg/100 mL infusion  Yes  Sig: Infuse 0.0413 mg/min via pump continuously.  Pharmacy to prepare for ambulatory use and  indicate rate of infusion.  TORsemide  (DEMADEX ) 20 MG tablet 02/17/2024 Morning Yes  Sig: Take 40 mg by mouth every Monday, Wednesday, and Friday  and take 20 MG (1 tab) on Tues, Thurs, Sat, and Sun.  acetaminophen  (TYLENOL ) 500 MG tablet Past Month Yes  Sig: Take 1,000 mg by mouth once daily as needed  multivitamin with minerals tablet 02/17/2024 Yes  Sig: Take 1 tablet by mouth 2 (two) times daily  pantoprazole  (PROTONIX ) 40 MG DR tablet Past Month Yes  Sig: Take 40 mg by mouth once daily as needed  riociguat  (ADEMPAS ) 2.5 mg tablet 02/17/2024 Yes  Sig: Take 2.5 mg by mouth 3 (three) times daily  warfarin (COUMADIN ) 1 MG tablet 02/16/2024 Bedtime Yes  Sig: Take 3.5 mg by mouth at bedtime  (0.5 tab of 5 MG plus 1 MG tab) on Thurs and Sun. Take 2.5 MG (0.5 tab of 5 MG) on Mon, Tues, Wed, Fri, and Sat.  warfarin (COUMADIN ) 5 MG tablet 02/16/2024 Bedtime Yes  Sig: Take 2.5 mg by mouth at bedtime  on Mon, Tues, Wed, Fri, and Sat. Take 3.5 MG (0.5 tab of 5 MG plus 1 MG tab) nightly on Thurs and Sun.    Facility-Administered Medications: None   Hospital Course: Wyndham Santilli is a 53 y.o. male who admitted to [facility] for worsening SOB and increased baseline O2 requirements. CTS was consulted for his CTEPH and surgical consideration.  The patient was taken to the operating room by Arnie Lang Dom, MD, who performed the above procedure(s).  Overall, the patient tolerated the procedure well without significant difficulty or evident complication, and was transferred to the cardiothoracic surgical intensive care unit.   In the immediate postoperative period, he continued to do progress well.  Ultimately, he was transferred to the step-down unit for the remainder of his hospital course.     The hospital course was significant for the following:   Acute on chronic respiratory failure: Baseline uses 2-3L of oxygen nightly. Required prolonged venilation and was extubated on 7/12 to  HFNC. Treated with diuresis and aggressive pulmonary toileting Patient was on RA at time of discharge  + Cdiff toxin: Patient to complete 2 weeks of PO Vancomycin  (7/14-7/28)  + BAL for pseudomonas on 7/12, he was treated with a 7 day course of cefepime  from 7/14 - 7/21   Rhythm at the time of discharge was normal sinus rhythm.   After discussion with the case manager, patient and family; the decision was made to have patient discharge to home. Once follow-up arrangements were confirmed, the patient was felt suitable for discharge to home on 03/23/2024.  Discharge Disposition: Home  Discharge Exam (taken from progress note on 03/22/2024):  BP 100/60 (BP Location: Left forearm, Patient Position: Lying, BP Cuff Size: Large Adult)   Pulse 98   Temp 36.6 C (97.9 F) (Oral)   Resp 20   Ht  170.2 cm (5' 7.01)   Wt (!) 134.6 kg (296 lb 12.8 oz)   SpO2 95%   BMI 46.47 kg/m   Neurologic: alert and oriented x 4, moves all extremities and follows commands Cardiac: regular rate and rhythm, S1 and S2 present and SR in 80s on telemetry  Respiratory: on RA, no increased WOB Abdomen: normal bowel sounds, soft, nontender and nondistended Extremities: pulses 2+ bilaterally, upper and lower extremities, 1+ BLE edema, chronic stasis changes to BLE Incisions: Midsternal incision clean, dry and intact w/ staples. Bilateral groins clean and dry - R groin w/ mepilex Lines: R DL Hickman - clean and dry.     Discharge Medications     New Medications      Details  FUROsemide  40 MG tablet Commonly known as: LASIX   40 mg, Oral, 2 times Daily Quantity: 60 tablet Refills: 0   magnesium  oxide 400 mg (241.3 mg magnesium ) tablet Commonly known as: MAG-OX  800 mg, Oral, 2 times Daily Quantity: 120 tablet Refills: 0   polyethylene glycol packet Commonly known as: MIRALAX   17 g, Oral, Daily PRN, Mix in 4-8ounces of fluid prior to taking. You may stop taking this when you are off your narcotics Refills:  0   sennosides-docusate 8.6-50 mg tablet Commonly known as: SENOKOT-S  2 tablets, Oral, 2 times Daily PRN Refills: 0   spironolactone  25 MG tablet Commonly known as: ALDACTONE  Start taking on: March 24, 2024  25 mg, Oral, Daily Quantity: 30 tablet Refills: 0   vancomycin  125 MG capsule Commonly known as: VANCOCIN  Start taking on: March 24, 2024  125 mg, Oral, Daily Quantity: 2 capsule Refills: 0       Modified Medications      Details  warfarin 2 MG tablet Commonly known as: COUMADIN  What changed:  medication strength how much to take additional instructions Another medication with the same name was removed. Continue taking this medication, and follow the directions you see here.  8 mg, Oral, Nightly Quantity: 120 tablet Refills: 2       Medications To Continue      Details  acetaminophen  500 MG tablet Commonly known as: TYLENOL   1,000 mg, Daily PRN Refills: 0   multivitamin with minerals tablet  1 tablet, 2 times Daily Refills: 0   pantoprazole  40 MG DR tablet Commonly known as: PROTONIX   40 mg, Daily PRN Refills: 0       Stopped Medications    MILrinone  in dextrose  5% 20 mg/100 mL infusion Commonly known as: PRIMACOR    riociguat  2.5 mg tablet Commonly known as: ADEMPAS    TORsemide  20 MG tablet Commonly known as: DEMADEX         Antiplatelets:  not indicated  Statins/Antilipid Agent(s):  not indicated  Beta Blocker: not indicated  ACE Inhibitor/ARB:  not indicated  Anticoagulation:  Patient discharged home on coumadin .  INR goal 2.0-3.0 for pulmonary embolism.  Check INR weekly x4, bi-weekly x2, then monthly once stable (more often if not stable).  INR to be managed by his PCP.  Patient has INR machine at home which is monitored by his PCP. The first time he should check his INR is on 03/26/2024 (Monday).   DISCHARGE INSTRUCTIONS:   Antibiotic Prophylaxis: Patient will not require antibiotics prior to any dental procedures.  Wound  care: Monitor wounds daily. Keep clean, dry, and intact.  Activity:  - Ambulate 2-3 times daily - May drive in 14 days if off of narcotic pain medication.  - Sternal precautions: May  lift objects if arms are kept close enough to touch body and avoid pushing/pulling objects with arms x 6 weeks. Avoid stretching both arms backwards for 10 days from surgery. Avoid lifting with one arm for 6 weeks from surgery.  May return to work: When cleared by your surgeon   Cardiac Rehab: N/A   Discharge Diet: Active Orders  Diet   Diet carbohydrate level 2 (60 gm/meal)    DISCHARGE LABORATORIES (last 3 days): Recent Labs  Lab 03/21/24 0428 03/22/24 0345 03/23/24 0409  NA 135 135 134*  K 4.0 4.1 4.3  CL 100 100 99  CO2 23 26 25   BUN 19 20 22*  CREATININE 1.2 1.4* 1.5*  GLUCOSE 109 104 110  CALCIUM  9.5 9.5 9.5   Recent Labs  Lab 03/21/24 0428 03/22/24 0345 03/23/24 0409  WBC 5.5 6.1 5.3  HGB 11.0* 10.4* 11.1*  HCT 34.4* 33.1* 34.6*  PLT 413 434 372   Recent Labs  Lab 03/21/24 0428 03/22/24 0344 03/23/24 0409  INR 1.6* 1.8* 2.0*   Future Appointments  Date Time Provider Department Center  03/23/2024 11:30 AM DU[facility] IR HA DUHNEUVAIR [facility]  04/04/2024 10:00 AM LABS-2F2G DC2F2G [facility]  04/04/2024 11:00 AM [facility] CLIN XR 15 [facility] DIAG [facility]  04/04/2024 11:30 AM Arnie, Lang Dom, MD 2F2G CARTHOR [facility]  05/28/2024  1:10 PM LABS-2F2G DC2F2G [facility]  05/28/2024  1:40 PM 6 MIN WALK, PULM LAB DS PUL FUNC [facility]  05/28/2024  2:20 PM Micki Oppenheim, MD DCPVSC [facility]   Report: chest/anginal pain like before surgery, swelling in feet, legs, or stomach, nausea or vomiting, fever greater than 101.0 F (38.3 C) degrees, bleeding, redness, swelling, pain, or drainage from surgical incision(s), difficulty breathing or shortness of breath, dizziness, or weight gain greater than 2 lbs in 1 day or 5 lbs in 1 week  Report Issues: Mon - Fri normal working  hours: Call Dr. Welby office at [phone] After hours and weekends: [phone] and ask for the Thoracic Surgery Resident on-call  Time Spent with Patient: 45 minutes    KAYLEE CHRISTINA MANLEY-WU, NP 03/23/2024

## 2024-03-16 ENCOUNTER — Telehealth: Payer: Self-pay

## 2024-03-16 NOTE — Telephone Encounter (Signed)
 Copied from CRM [id number]. Topic: Appointments - Scheduling Inquiry for Clinic >> Mar 16, 2024 12:04 PM Sasha H wrote: Reason for CRM: Lauraine Blunt from [facility] cn be reached at [phone] , she called in to get this pt who will be discharging over the weekend seen on Monday for hospital follow up and INR check, due to blood clots on lung. Please reach out to her to get this pt scheduled, the soonest available on my side was 7/25 and she stated it could not wait that long.

## 2024-03-16 NOTE — Patient Instructions (Signed)
 Ryan [name] - I am sorry I was unable to reach you today for our scheduled appointment. I work with Marylynn Verneita CROME, MD and am calling to support your healthcare needs. Please contact me at [phone] at your earliest convenience. I look forward to speaking with you soon.   Thank you,  Nestora Duos, MSN, RN [facility]  [facility], [facility] RN Care Manager Direct Dial: [phone] Fax: [phone]

## 2024-03-19 NOTE — Care Plan (Signed)
  Problem: Safety Goal: Free from accidental physical injury Outcome: Progressing Goal: Free from abuse Outcome: Progressing   Problem: Daily Care Goal: Daily care needs are met Description: Assess and monitor ability to perform self care and identify potential discharge needs. Outcome: Progressing   Problem: Pain Goal: Patient's pain/discomfort is manageable Description: Assess and monitor patient's pain using appropriate pain scale. Collaborate with interdisciplinary team and initiate plan and interventions as ordered. Re-assess patient's pain level 30 - 60 minutes after pain management intervention.  Outcome: Progressing   Problem: Compromised Skin Integrity Goal: Skin integrity is maintained or improved Description: Assess and monitor skin integrity. Identify patients at risk for skin breakdown on admission and per policy. Collaborate with interdisciplinary team and initiate plans and interventions as needed. Outcome: Progressing Goal: Fluid and electrolyte balance are achieved/maintained Description: Assess and monitor vital signs (orthostatic vitals if applicable), fluid intake and output, urine color, labs, skin turgor, mucous membranes, jugular venous distention, edema, circumference of edematous extremities and abdominal girth, respiratory status, and mental status.  Monitor for signs and symptoms of hypovolemia (tachycardia, rapid breathing, decreased urine output, postural hypotension, confusion, syncope).  Monitor for signs and symptoms of hypervolemia (strong rapid pulse, shortness of breath, difficulty breathing lying down, crackles heard in lung fields, edema). Collaborate with interdisciplinary team and initiate plan and interventions as ordered. Outcome: Progressing Goal: Nutritional status is improving Description: Monitor and assess patient for malnutrition (ex- brittle hair, bruises, dry skin, pale skin and conjunctiva, muscle wasting, smooth red tongue, and  disorientation). Collaborate with interdisciplinary team and initiate plan and interventions as ordered.  Monitor patient's weight and dietary intake as ordered or per policy. Utilize nutrition screening tool and intervene per policy. Determine patient's food preferences and provide high-protein, high-caloric foods as appropriate.  Outcome: Progressing   Problem: Knowledge Deficit Goal: Patient/family/caregiver demonstrates understanding of disease process, treatment plan, medications, and discharge instructions Description: Complete learning assessment and assess knowledge base. Outcome: Progressing   Problem: Discharge Barriers Goal: Patient's discharge needs are met Description: Collaborate with interdisciplinary team and initiate plans and interventions as needed.  Outcome: Progressing   Problem: All patients at High risk for falls Goal: Patient will remain free of falls. Outcome: Progressing   Problem: Gait Alteration Goal: Patient will mobilize safely without falling Outcome: Progressing   Problem: Cognitive/Mental or Behavior Alteration Goal: Patient will mobilize safely without falling Outcome: Progressing   Problem: Medications Goal: Patient will mobilize safely without falling Outcome: Progressing   Problem: Hemodynamic Instability: Goal: Patient will maintain optimal hemodynamic status. Outcome: Progressing   Problem: Cognitive: Goal: Mental status will improve. Outcome: Progressing   Problem: Respiratory: Goal: Patient will maintain adequate gas exchange. Outcome: Progressing   Problem: Activity: Goal: Ability to meet self-care needs will improve. Outcome: Progressing Goal: Ability to tolerate increased activity will improve. Outcome: Progressing   Problem: Bowel/Gastric/Renal: Goal: Patient will maintain adequate renal function. 03/19/2024 0034 by Suzzette Sinclair, RN Outcome: Progressing 03/19/2024 0034 by Suzzette Sinclair, RN Outcome: Progressing    Problem: Nutritional: Goal: Ability to attain and maintain optimal nutritional status will be restored. 03/19/2024 0034 by Suzzette Sinclair, RN Outcome: Progressing 03/19/2024 0034 by Suzzette Sinclair, RN Outcome: Progressing   Problem: Sensory: Goal: Monitor location of pain. 03/19/2024 0034 by Suzzette Sinclair, RN Outcome: Progressing 03/19/2024 0034 by Suzzette Sinclair, RN Outcome: Progressing

## 2024-03-19 NOTE — Telephone Encounter (Signed)
 LMTCB

## 2024-03-19 NOTE — Telephone Encounter (Signed)
 Pt is scheduled with Rollene, NP for a hospital follow up on 03/27/2024. However is it ok for pt to come in this afternoon to have the INR checked?

## 2024-03-21 NOTE — Telephone Encounter (Signed)
 Spoke with pt and he stated that he is still currently in the hospital.

## 2024-03-21 NOTE — Progress Notes (Signed)
 [facility] SDU Surgery Progress Note    Ryan [name] is a 53 y.o.male with CTEPH who is now s/p PTE on 03/06/24.  Subjective:   Doing well this AM. No acute events overnight. Remains on RA, PT cleared for home with HHPT. Continues to have a subtherapeutic INR.   Objective:   Temp (24hrs), Avg:36.6 C (97.8 F), Min:36.2 C (97.1 F), Max:36.8 C (98.3 F)  Heart Rate:  [81-100] 85 Resp:  [16-20] 16 BP: (104-131)/(69-95) 115/84  Last weight: (!) 134.6 kg (296 lb 12.8 oz)   First weight: (!) 131 kg (288 lb 12.8 oz)  24-Hour Intake/Output: I/O last 2 completed shifts: In: 1304.1 [P.O.:1080; I.V.:224.1] Out: 1825 [Urine:1825]  Last BM Date: 03/20/24  Infusions: . heparin  ([facility]) 2,100 Units/hr (03/21/24 0740)    Scheduled Meds:  . FUROsemide   40 mg Oral BID  . guaiFENesin   600 mg Oral BID  . magnesium  oxide  800 mg Oral BID  . pantoprazole   40 mg Oral Daily  . spironolactone   25 mg Oral Daily  . vancomycin   125 mg Oral Daily  . warfarin  8 mg Oral QHS    Labs: Recent Labs  Lab 03/19/24 0443 03/20/24 0433 03/21/24 0428  NA 136 135 135  K 4.0 4.0 4.0  CL 107 104 100  CO2 22 23 23   BUN 16 17 19   CREATININE 1.3 1.2 1.2  GLUCOSE 112 110 109  CALCIUM  9.4 9.4 9.5  MG 1.8 1.8 1.9   Recent Labs  Lab 03/19/24 0443 03/20/24 0433 03/21/24 0428  WBC 7.0 5.6 5.5  HGB 10.7* 10.8* 11.0*  HCT 34.1* 34.4* 34.4*  PLT 420 422 413    Coagulation Recent Labs  Lab 03/19/24 0443 03/19/24 1056 03/20/24 0433 03/20/24 1001 03/20/24 1640 03/20/24 2210 03/21/24 0428  APTT 63.6*   < > 133.0*   < > 71.3* 86.0* 118.4*  INR 1.5*  --  1.6*  --   --   --  1.6*   < > = values in this interval not displayed.    Micro:  7/12: BAL: +PSA  CXR: 03/21/2024: pending   Physical exam:  Neurologic: alert and oriented x 4, moves all extremities and follows commands Cardiac: regular rate and rhythm, S1 and S2 present and SR in 80s on telemetry  Respiratory: on RA, no increased WOB Abdomen:  normal bowel sounds, soft, nontender and nondistended Extremities: pulses 2+ bilaterally, upper and lower extremities, 1+ BLE edema, chronic stasis changes to BLE Incisions: Midsternal incision clean, dry and intact w/ staples. Bilateral groins clean and dry - R groin w/ mepilex Lines: R DL Hickman - clean and dry.   Assessment:   Principal Problem:   S/P PTE Active Problems:   Chronic diastolic heart failure (CMS/HHS-HCC)   DVT (deep venous thrombosis) (CMS/HHS-HCC)   HTN (hypertension)   Morbid obesity with BMI of 50.0-59.9, adult (CMS/HHS-HCC)   Pulmonary emboli (CMS/HHS-HCC)   Acute on chronic respiratory failure with hypoxia (CMS/HHS-HCC)   CTEPH (chronic thromboembolic pulmonary hypertension) (CMS/HHS-HCC)   Stage 3a chronic kidney disease (CMS/HHS-HCC)   OSA (obstructive sleep apnea)   Type 2 diabetes mellitus, without long-term current use of insulin  (CMS/HHS-HCC)   PAH (pulmonary artery hypertension) (CMS/HHS-HCC)   Acute postoperative pain   Postoperative anemia due to acute blood loss   Right ventricular dysfunction   Postoperative hypoxemia   Warfarin anticoagulation   Acute postoperative respiratory insufficiency   Chronic respiratory failure (CMS/HHS-HCC) Resolved Problems:   Leukocytosis   Hypoxemia requiring supplemental  oxygen   Fever   Encounter for monitoring diuretic therapy   Receiving inotropic medication   Plan:   CTEPH s/p PTE on 7/8 Chronic Anticoagulation Acute post operative respiratory insufficiency  Chronic respiratory failure (pre-op 2-3L O2 at HS)  Recurrent DVT & PE - ASA/statin/BB: not indicated  - ACE/ARB: not indicated (EF >55%)  - GI ppx: PPI - DVT ppx: SCDs, ambulating  - anticoagulation: Coumadin  qhs (goal INR 2.0-3.0) with heparin  bridge while INR is subtherapeutic (PTT 60-90) - diuresis: PO Lasix  40 BID, Aldactone  25 mg daily  - RA yesterday, used 2L overnight   Acute post-operative pain  - continue PRN tylenol   - last  oxycodone  on 7/12  - bowel regimen: senna, colace, miralax   - Last BM Date: 03/20/24    Pseudomonas HAP 7/12  Hx of C diff colitis  - Cefepime  x 7 days (7/14-7/21) for PNA - PO vancomycin  (7/14-7/28) for cdiff ppx while on cefepime  + 7 days - C diff toxin positive 6/21 (treated outpatient with PO Vanc)  General debility  - Aggressive PT/OT   Disposition: once medically stable, anticipate discharge in next few days pending INR and progress with PT   BRAYLEE KIZZIE ADA, NP

## 2024-03-26 ENCOUNTER — Telehealth: Payer: Self-pay

## 2024-03-26 NOTE — Patient Instructions (Signed)
 Visit Information  Thank you for taking time to visit with me today. Please don't hesitate to contact me if I can be of assistance to you before our next scheduled telephone appointment.  Our next appointment is by telephone on 04/05/24 at 10 am  Following is a copy of your care plan:   Goals Addressed             This Visit's Progress    VBCI Transitions of Care (TOC) Care Plan       Problems:  Recent Hospitalization for treatment of pulmonary thrombo endarterectomy Knowledge Deficit Related to management status post pulmonary thrombo endarterectomy  Goal:  Over the next 30 days, the patient will not experience hospital readmission  Interventions:  Transitions of Care: Doctor Visits  - discussed the importance of doctor visits Post discharge activity limitations prescribed by provider reviewed Post-op wound/incision care reviewed with patient/caregiver Reviewed Signs and symptoms of infection Confirmed patient received ordered DME- Bariatric Rollator Confirmed patient was contact by [facility] and provided start of care date. Confirmed patient has follow up visit with primary care provider and surgeon office and has transportation to these appointments Medications reviewed and compliance discussed Confirmed patient has contact phone number for surgeon's office and primary care provider.  Teach back method used to review wound/ incision discharge instruction Discussed and offered 30 day TOC follow up program- patient verbally agreed to program  Patient Self Care Activities:  Attend all scheduled provider appointments Call pharmacy for medication refills 3-7 days in advance of running out of medications Call provider office for new concerns or questions  Take medications as prescribed   Notify your surgeon if you notice signs of infection, experience increase in pain unrelieved by prescribed pain management Call 911 for severe symptoms with breathing, chest pain Call  your primary care provider for any new and/ or ongoing symptoms.  Review your discharge instructions to make sure you are adhering to your wound care/ activity limitation. You have been enrolled in the 30 day TOC program for follow up.  Plan:  The patient has been provided with contact information for the care management team and has been advised to call with any health related questions or concerns.         Patient verbalizes understanding of instructions and care plan provided today and agrees to view in MyChart. Active MyChart status and patient understanding of how to access instructions and care plan via MyChart confirmed with patient.     The patient has been provided with contact information for the care management team and has been advised to call with any health related questions or concerns.   Please call the care guide team at [phone] if you need to cancel or reschedule your appointment.   Please call the Suicide and Crisis Lifeline: 988 call 1-800-273-TALK (toll free, 24 hour hotline) if you are experiencing a Mental Health or Behavioral Health Crisis or need someone to talk to.  Arvin Seip RN, BSN, CCM [employer], Population Health Case Manager Phone: [phone]

## 2024-03-26 NOTE — Telephone Encounter (Signed)
 Spoke with pt and rescheduled him for 04/02/2024 with Dr. Narendra

## 2024-03-26 NOTE — Telephone Encounter (Unsigned)
 Copied from CRM [id number]. Topic: Appointments - Scheduling Inquiry for Clinic >> Mar 26, 2024 11:22 AM Adelita E wrote: Reason for CRM: Patient called in to reschedule his hospital follow-up that is tomorrow 7/29. Patient stated he was discharged on 7/25, but agent was not populating dates with PCP within the 14 day timeframe for him to reschedule. Callback number [phone].
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
## 2024-03-27 ENCOUNTER — Inpatient Hospital Stay: Admitting: Family

## 2024-03-29 ENCOUNTER — Other Ambulatory Visit: Payer: Self-pay

## 2024-04-02 ENCOUNTER — Inpatient Hospital Stay: Admitting: Internal Medicine

## 2024-04-05 ENCOUNTER — Other Ambulatory Visit: Payer: Self-pay

## 2024-04-05 NOTE — Patient Instructions (Signed)
 Visit Information  Thank you for taking time to visit with me today. Please don't hesitate to contact me if I can be of assistance to you before our next scheduled telephone appointment.  Our next appointment is by telephone on 04/13/24 at 10 am  Following is a copy of your care plan:   Goals Addressed             This Visit's Progress    VBCI Transitions of Care (TOC) Care Plan       Problems:  Recent Hospitalization for treatment of pulmonary thrombo endarterectomy Knowledge Deficit Related to management status post pulmonary thrombo endarterectomy  Goal:  Over the next 30 days, the patient will not experience hospital readmission  Interventions:  Transitions of Care: Doctor Visits  - discussed the importance of doctor visits Post discharge activity limitations prescribed by provider reviewed Post-op wound/incision care reviewed with patient/caregiver Reviewed Signs and symptoms of infection Reviewed cardiothoracic surgeon wound care instructions from 04/04/24 office visit:  Please perform a moist to dry dressing 2x per day.  Unfurl ribbon gauze, cut a few inches of length  Moisten the ribbon gauze with a few drops of Vashe solution Pack the vashe-dampened ribbon gauze into the open wound bed; use the wooden end of Q-tip to fill the space Cover with a dry gauze Secure with tape  continue packing until the wound scabs over  Discussed upcoming provider visits.  Patient states he will be changing is primary care provider to Dr. Fernande with [facility]. Follow up visit is scheduled for 04/11/24.  Reminded patient to call previous primary care provider office to cancel scheduled appointment for 04/19/24. Medications reviewed and compliance discussed Assessed pain level, oxygen level and blood pressure reading Confirmed patient has contact phone number for surgeon's office and primary care provider.  Teach back method used to review wound/ incision discharge instruction Reviewed  advised allowed activity from cardiothoracic surgeon visit on 04/04/24: Please contact Dr. Welby office at [phone] with any wound-related concerns (new or increased drainage, redness, or separation of your incision) No pushing, pulling, or lifting more than 10 pounds for 6 weeks after surgery. Then, gradually increase lifting by 5 pounds per week.  No soaking of incisions/submerging under water  until 2 months after surgery. You may shower. Let soapy water  run over your incision, rinse clean, gently pat dry.  You may resume driving if you are not taking narcotic pain medication.    Patient Self Care Activities:  Attend all scheduled provider appointments Call pharmacy for medication refills 3-7 days in advance of running out of medications Call provider office for new concerns or questions  Take medications as prescribed   Notify your surgeon if you notice signs of infection, experience increase in pain unrelieved by prescribed pain management Call 911 for severe symptoms with breathing, chest pain Call your primary care provider for any new and/ or ongoing symptoms.  Follow advised instructions for wound care, coumadin  dosing, and activity level from cardiothoracic visit from 04/04/24.  Confirmed patient has all listed instructions in writing.  Continue ongoing monitoring of oxygen level and blood pressure reading.   Plan:  Telephone follow up appointment with care management team member scheduled for:  04/13/24 at 10 am        Patient verbalizes understanding of instructions and care plan provided today and agrees to view in MyChart. Active MyChart status and patient understanding of how to access instructions and care plan via MyChart confirmed with patient.  The patient has been provided with contact information for the care management team and has been advised to call with any health related questions or concerns.   Please call the care guide team at [phone] if you  need to cancel or reschedule your appointment.   Please call the Suicide and Crisis Lifeline: 988 call 1-800-273-TALK (toll free, 24 hour hotline) if you are experiencing a Mental Health or Behavioral Health Crisis or need someone to talk to.  Arvin Seip RN, BSN, CCM [employer], Population Health Case Manager Phone: [phone]

## 2024-04-05 NOTE — Transitions of Care (Post Inpatient/ED Visit) (Signed)
 Transition of Care week 2  Visit Note  04/05/2024  Name: Ryan [name] MRN: [id number]          DOB: 1971/02/14  Situation: Patient enrolled in [facility] 30-day program. Visit completed with patient by telephone.   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  Regular the workup has not been forthcoming) previously followed by Dr. Darcy at [facility]   Hepatic steatosis    Hypertension    Morbid obesity with BMI of 50.0-59.9, adult (HCC)    Oxygen deficiency    Peripheral vascular disease (HCC)    Presence of IVC filter    Has 2 IVC filters with significant thrombus burden superior to the filter.   Pulmonary embolus (HCC) 12/2016   Initially just treated with anticoagulation, but in February 2023 in April 2024 treated with EKOS thrombectomy for submassive PE.   Ventricular trigeminy    Weakness of both lower limbs     Assessment: Patient Reported Symptoms: Cognitive Cognitive Status: Normal speech and language skills, Insightful and able to interpret abstract concepts, Alert and oriented to person, place, and time      Neurological Neurological Review of Symptoms: No symptoms reported    HEENT HEENT Symptoms Reported: No symptoms reported      Cardiovascular Cardiovascular Symptoms Reported: Other: Other Cardiovascular Symptoms: patient reports having follow up visit with cardiothoracic surgeon on 04/04/24. He reports having 58 staples removed.  Patient states his incision is healing well and denies any signs of infection. Patient  states he i keeping incision clean and dry and doing a wet to dry dressing change to small open wound in stomach from tubing.  Patient able to verbalized wet to dry dressing change and reports having all needed supplies. Does patient have uncontrolled Hypertension?: No Cardiovascular Management Strategies: Routine screening (dressing change)  Respiratory Respiratory Symptoms Reported: No symptoms reported Additional Respiratory Details: Patient reports his oxygen level on yesterday was at 100%.  He states he is doing well with his breathing. He states he was able to walk using his Rollator into the surgeon office.  He reports being a little tired today due to the increase in activity on yesterday with doctors visits.    Endocrine Endocrine Symptoms Reported: No symptoms reported    Gastrointestinal Gastrointestinal Symptoms Reported: No symptoms reported      Genitourinary Genitourinary Symptoms Reported: No symptoms reported    Integumentary Integumentary Symptoms Reported: Wound Additional Integumentary Details: patient reports having follow up visit with cardiothoracic surgeon on 04/04/24. He reports having 58 staples removed. Patient states his incision is healing well and denies any signs of infection. Patient states he i keeping incision clean and dry and doing a wet to dry dressing change to small open wound in stomach from tubing. Patient able to verbalized wet to dry dressing change and reports having all needed supplies. Skin Management Strategies: Routine screening, Dressing changes, Medication therapy, Adequate rest  Musculoskeletal Musculoskelatal Symptoms Reviewed: Weakness Additional Musculoskeletal Details: patient reports ongoing use of  ambulatory device( Rollator) due to some weakness from surgery        Psychosocial Psychosocial Symptoms Reported: No symptoms reported         Vitals:   04/05/24 1040  BP: 120/80  SpO2: 97%    Medications Reviewed Today     Reviewed  by Sheilyn Boehlke E, RN (Registered Nurse) on 04/05/24 at 1024  Med List Status: <None>   Medication Order Taking? Sig Documenting Provider Last Dose Status Informant  acetaminophen  (TYLENOL ) 500 MG tablet [id number] Yes Take 1,000 mg by mouth every 6 (six) hours as needed. Patient reported [provider]  Active   albuterol  (VENTOLIN  HFA) 108 (90 Base) MCG/ACT inhaler [id number] Yes Inhale 2 puffs into the lungs every 6 (six) hours as needed for wheezing or shortness of breath. Alexander, Natalie, DO  Active Self, Pharmacy Records           Med Note [facility] A   Fri Feb 17, 2024  3:01 PM) PRN  cyanocobalamin  1000 MCG tablet [id number] Yes Take 1 tablet (1,000 mcg total) by mouth daily. Tobie Calix, MD  Active   furosemide  (LASIX ) 40 MG tablet [id number] Yes Take 40 mg by mouth 2 (two) times daily. [provider]  Active   macitentan  (OPSUMIT ) 10 MG tablet [id number] Yes Take 1 tablet (10 mg total) by mouth daily. Patel, Sona, MD  Active   magnesium  oxide (MAG-OX) 400 (240 Mg) MG tablet [id number] Yes Take 800 mg by mouth 2 (two) times daily. [provider]  Active   milrinone  (PRIMACOR ) 20 MG/100 ML SOLN infusion [id number]  Inject 0.0413 mg/min into the vein continuous.  Patient not taking: Reported on 04/05/2024   Darci Pore, MD  Active Self, Pharmacy Records  Multiple Vitamin (MULTIVITAMIN WITH MINERALS) TABS tablet [id number] Yes Take 1 tablet by mouth daily. Dorinda Drue DASEN, MD  Active Self, Pharmacy Records  pantoprazole  (PROTONIX ) 40 MG tablet [id number] Yes Take 1 tablet (40 mg total) by mouth daily. On an empty stomach 30 minutes prior to eating Marylynn Verneita CROME, MD  Active Self, Pharmacy Records           Med Note [facility], MELISSA R   Wed Dec 14, 2023  9:25 AM)    polyethylene glycol (MIRALAX  / GLYCOLAX ) 17 g packet [id number] Yes Take 17 g by mouth daily as needed. [provider]  Active   Riociguat  (ADEMPAS ) 2.5 MG TABS [id number]  Take  2.5 mg by mouth 3 (three) times daily.  Patient not taking: Reported on 04/05/2024   Delsie Jaun RAMAN, RPH-CPP  Active Self, Pharmacy Records  senna-docusate (SENOKOT-S) 8.6-50 MG tablet [id number] Yes Take 2 tablets by mouth 2 (two) times daily as needed for mild constipation. [provider]  Active   spironolactone  (ALDACTONE ) 25 MG tablet [id number] Yes Take 1 tablet (25 mg total) by mouth daily. Patel, Sona, MD  Active   torsemide  (DEMADEX ) 20 MG tablet [id number]  Take 2 tablets (40 mg total) by mouth daily.  Patient not taking: Reported on 03/26/2024   Zenaida Morene PARAS, MD  Active Self, Pharmacy Records  vancomycin  (VANCOCIN ) 125 MG capsule [id number] Yes Take 125 mg by mouth 2 (two) times daily. [provider]  Active   Vitamin D , Ergocalciferol , (DRISDOL ) 1.25 MG (50000 UNIT) CAPS capsule [id number] Yes Take 1 capsule (50,000 Units total) by mouth every 7 (seven) days. Patel, Sona, MD  Active   warfarin (COUMADIN ) 1 MG tablet [id number] Yes Take with 5 mg on Thursdays  and Sundays  Patient taking differently: Patient reports provider instructed:  Recheck INR tomorrow night 04/05/24 and if INR > 4, skip tomorrow's dose as well. Then, decrease to 6mg  nightly unless advised otherwise by your PCP.   Marylynn Verneita CROME, MD  Active Self, Pharmacy Records           Med Note [facility], MELISSA R   Wed Dec 14, 2023  9:30 AM)    warfarin (COUMADIN ) 5 MG tablet [id number]  Take 1 tablet (5 mg total) by mouth daily. Marylynn Verneita CROME, MD  Active Self, Pharmacy Records            Goals Addressed             This Visit's Progress    VBCI Transitions of Care (TOC) Care Plan       Problems:  Recent Hospitalization for treatment of pulmonary thrombo endarterectomy Knowledge Deficit Related to management status post pulmonary thrombo endarterectomy  Goal:  Over the next 30 days, the patient will not experience hospital readmission  Interventions:  Transitions of Care: Doctor Visits  -  discussed the importance of doctor visits Post discharge activity limitations prescribed by provider reviewed Post-op wound/incision care reviewed with patient/caregiver Reviewed Signs and symptoms of infection Reviewed cardiothoracic surgeon wound care instructions from 04/04/24 office visit:  Please perform a moist to dry dressing 2x per day.  Unfurl ribbon gauze, cut a few inches of length  Moisten the ribbon gauze with a few drops of Vashe solution Pack the vashe-dampened ribbon gauze into the open wound bed; use the wooden end of Q-tip to fill the space Cover with a dry gauze Secure with tape  continue packing until the wound scabs over  Discussed upcoming provider visits.  Patient states he will be changing is primary care provider to Dr. Fernande with [facility]. Follow up visit is scheduled for 04/11/24.  Reminded patient to call previous primary care provider office to cancel scheduled appointment for 04/19/24. Medications reviewed and compliance discussed Assessed pain level, oxygen level and blood pressure reading Confirmed patient has contact phone number for surgeon's office and primary care provider.  Message sent to patients assigned CCM case manager, Nestora Duos, RN informing her of patient transitioning to new primary care provider and new patient appointment date 04/11/24.  Teach back method used to review wound/ incision discharge instruction Reviewed advised allowed activity from cardiothoracic surgeon visit on 04/04/24: Please contact Dr. Welby office at [phone] with any wound-related concerns (new or increased drainage, redness, or separation of your incision) No pushing, pulling, or lifting more than 10 pounds for 6 weeks after surgery. Then, gradually increase lifting by 5 pounds per week.  No soaking of incisions/submerging under water  until 2 months after surgery. You may shower. Let soapy water  run over your incision, rinse clean, gently pat dry.  You may  resume driving if you are not taking narcotic pain medication.    Patient Self Care Activities:  Attend all scheduled provider appointments Call pharmacy for medication refills 3-7 days in advance of running out of medications Call provider office for new concerns or questions  Take medications as prescribed   Notify your surgeon if you notice signs of infection, experience increase in pain unrelieved by prescribed pain management Call 911 for severe symptoms with breathing, chest pain Call your primary care provider for any new and/ or ongoing symptoms.  Follow advised instructions for wound care, coumadin  dosing, and activity level from cardiothoracic visit from 04/04/24.  Confirmed patient has all listed instructions in writing.  Continue ongoing monitoring of oxygen level and blood pressure reading.   Plan:  Telephone follow up appointment with care management team member scheduled for:  04/13/24 at 10 am         Recommendation:   PCP Follow-up Specialty provider follow-up 05/28/24 with pulmonologist Follow up with new primary care provider 04/11/24. Continue current plan of care  Follow Up Plan:   Telephone follow-up in 1 week7/15/25 at 10 am  Arvin Seip RN, BSN, CCM   [facility], Population Health Case Manager Phone: [phone]

## 2024-04-11 ENCOUNTER — Telehealth: Payer: Self-pay | Admitting: Internal Medicine

## 2024-04-11 NOTE — Telephone Encounter (Signed)
 Copied from CRM #[id number]. Topic: General - Other >> Apr 11, 2024  1:16 PM Lavanda D wrote: Reason for CRM: Aiyanna with [employer] is calling to verify Mr. Mash last office visit with Dr. Marylynn. They are sending a progress form with a request for medical information from Nov. 2024-Present. She would like to verify that this fax has been received - faxed on 8/11, sending another copy today.   Callback #: [id number] ext: [phone] fax: [phone]

## 2024-04-11 NOTE — Telephone Encounter (Signed)
 LMTCB. Need to let Ryan [name] know that we have received the paperwork and we are waiting on pt to call back and schedule an appt before Dr. Marylynn can complete the paperwork.

## 2024-04-13 ENCOUNTER — Other Ambulatory Visit: Payer: Self-pay

## 2024-04-13 NOTE — Transitions of Care (Post Inpatient/ED Visit) (Signed)
 Transition of Care week 3  Visit Note  04/13/2024  Name: Ryan [name] MRN: [id number]          DOB: 04/24/1971  Situation: TOC 30 day enrollment program closed.  [facility]   Visit completed with patient by telephone.   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  Regular the workup has not been forthcoming) previously [facility] by [facility] Darcy [facility]   Hepatic steatosis    Hypertension    Morbid obesity with BMI of 50.0-59.9, adult (HCC)    Oxygen deficiency    Peripheral vascular disease (HCC)    Presence of IVC filter    Has 2 IVC filters with significant thrombus burden superior to the filter.   Pulmonary embolus (HCC) 12/2016   Initially just treated with anticoagulation, but in February 2023 in April 2024 treated with EKOS thrombectomy for submassive PE.   Ventricular trigeminy    Weakness of both lower limbs     Assessment: Patient Reported Symptoms: Cognitive Cognitive Status: Normal speech and language skills, Insightful and able to interpret abstract concepts, Alert and oriented to person, place, and time, No symptoms reported      Neurological Neurological Review of Symptoms: No symptoms reported    HEENT HEENT Symptoms Reported: No symptoms reported      Cardiovascular Cardiovascular Symptoms Reported: No symptoms reported Other Cardiovascular Symptoms: patient states he is a little tired and sore however overall he is doing very well. Denies any signs of infection at wound  site. Patient reports having a follow up visit with his new primary care provider on 04/11/24.    Respiratory Respiratory Symptoms Reported: No symptoms reported Other Respiratory Symptoms: patient states he breathing is doing very well.    Endocrine Endocrine Symptoms Reported: No symptoms reported    Gastrointestinal Gastrointestinal Symptoms Reported: No symptoms reported      Genitourinary Genitourinary Symptoms Reported: No symptoms reported    Integumentary Integumentary Symptoms Reported: Wound Additional Integumentary Details: Patient reports he continues to do his dressing changes as advised. denies any signs of infection    Musculoskeletal Musculoskelatal Symptoms Reviewed: No symptoms reported        Psychosocial Psychosocial Symptoms Reported: No symptoms reported         There were no vitals filed for this visit.  Medications Reviewed Today     Reviewed by Jalecia Leon E, RN (Registered Nurse) on 04/13/24 at 1025  Med List Status: <None>   Medication Order Taking? Sig Documenting Provider Last Dose Status Informant  acetaminophen  (TYLENOL ) 500 MG tablet [id number] Yes Take 1,000 mg by mouth every 6 (six) hours as needed. Patient reported [provider]  Active   albuterol  (VENTOLIN  HFA) 108 (90 Base) MCG/ACT inhaler [id number] Yes Inhale 2 puffs into the lungs every 6 (six) hours as needed for wheezing or shortness of breath. Alexander, Natalie, DO  Active Self, Pharmacy Records           Med Note [facility],  ELIZABETH A   Fri Feb 17, 2024  3:01 PM) PRN  cyanocobalamin  1000 MCG tablet [id number] Yes Take 1 tablet (1,000 mcg total) by mouth daily. Patel, Sona, MD  Active   furosemide  (LASIX ) 40 MG tablet [id number] Yes Take 40 mg by mouth 2 (two) times daily. [provider]  Active   macitentan  (OPSUMIT ) 10 MG tablet [id number] Yes Take 1 tablet (10 mg total) by mouth daily. Patel, Sona, MD  Active   magnesium  oxide (MAG-OX) 400 (240 Mg) MG tablet  [id number] Yes Take 800 mg by mouth 2 (two) times daily. [provider]  Active   milrinone  (PRIMACOR ) 20 MG/100 ML SOLN infusion [id number]  Inject 0.0413 mg/min into the vein continuous.  Patient not taking: Reported on 04/13/2024   Darci Pore, MD  Active Self, Pharmacy Records  Multiple Vitamin (MULTIVITAMIN WITH MINERALS) TABS tablet [id number] Yes Take 1 tablet by mouth daily. Dorinda Drue DASEN, MD  Active Self, Pharmacy Records  pantoprazole  (PROTONIX ) 40 MG tablet [id number] Yes Take 1 tablet (40 mg total) by mouth daily. On an empty stomach 30 minutes prior to eating Marylynn Verneita CROME, MD  Active Self, Pharmacy Records           Med Note [facility], MELISSA R   Wed Dec 14, 2023  9:25 AM)    polyethylene glycol (MIRALAX  / GLYCOLAX ) 17 g packet [id number] Yes Take 17 g by mouth daily as needed. [provider]  Active   Riociguat  (ADEMPAS ) 2.5 MG TABS [id number]  Take 2.5 mg by mouth 3 (three) times daily.  Patient not taking: Reported on 04/13/2024   Delsie Jaun RAMAN, RPH-CPP  Active Self, Pharmacy Records  senna-docusate (SENOKOT-S) 8.6-50 MG tablet [id number] Yes Take 2 tablets by mouth 2 (two) times daily as needed for mild constipation. [provider]  Active   spironolactone  (ALDACTONE ) 25 MG tablet [id number] Yes Take 1 tablet (25 mg total) by mouth daily. Patel, Sona, MD  Active   torsemide  (DEMADEX ) 20 MG tablet [id number]  Take 2 tablets (40 mg total) by mouth daily.  Patient not taking: Reported on 03/26/2024   Zenaida Morene PARAS, MD  Active Self, Pharmacy Records  vancomycin  (VANCOCIN ) 125 MG capsule [id number] Yes Take 125 mg by mouth 2 (two) times daily. [provider]  Active   Vitamin D , Ergocalciferol , (DRISDOL ) 1.25 MG (50000 UNIT) CAPS capsule [id number] Yes Take 1 capsule (50,000 Units total) by mouth every 7 (seven) days. Patel, Sona, MD  Active   warfarin (COUMADIN ) 1 MG tablet [id number] Yes Take with 5 mg on Thursdays and Sundays Tullo, Teresa  L, MD  Active Self, Pharmacy Records           Med Note [facility], MELISSA R   Wed Dec 14, 2023  9:30 AM)    warfarin (COUMADIN ) 5 MG tablet [id number] Yes Take 1 tablet (5 mg total) by mouth daily. Marylynn Verneita CROME, MD  Active Self, Pharmacy Records            Recommendation:   Ongoing follow up with surgeon and primary care provider.   Follow Up Plan:   Closing From:  Transitions of Care Program  Patient transferred care to non [facility] provider.   Arvin Seip RN, BSN, CCM [employer], Population Health Case Manager Phone: [phone]

## 2024-04-17 ENCOUNTER — Telehealth (HOSPITAL_COMMUNITY): Payer: Self-pay | Admitting: *Deleted

## 2024-04-17 NOTE — Telephone Encounter (Signed)
 Forms for [employer] completed, signed by Dr Zenaida and faxed along with last OV note back to [phone]

## 2024-04-19 ENCOUNTER — Ambulatory Visit: Admitting: Internal Medicine

## 2024-04-19 ENCOUNTER — Telehealth: Payer: Self-pay | Admitting: Internal Medicine

## 2024-04-19 NOTE — Telephone Encounter (Signed)
 See previous message

## 2024-04-19 NOTE — Telephone Encounter (Signed)
 Spoke with [employer] to let them know that we have received the disability paperwork but we have not completed it yet because the pthas not scheduled an appt to discuss with Dr Tullo as she requested.

## 2024-04-19 NOTE — Telephone Encounter (Signed)
 [employer] called the office today inquiring about a fax sent on both 8/11 and 8/13 regarding Attending Physician Statement and Progress forms needing to be filled out and faxed back immediately. They will be sending it again today, we will make it high priority when it comes in. Contact info for the [employer]: Phone [phone] Ext [phone]  -Fax [phone]

## 2024-04-27 ENCOUNTER — Telehealth (HOSPITAL_COMMUNITY): Payer: Self-pay

## 2024-04-27 ENCOUNTER — Telehealth: Payer: Self-pay | Admitting: Cardiology

## 2024-04-27 NOTE — Telephone Encounter (Signed)
 Requested documentation faxed to [facility] on 04/27/2024

## 2024-04-27 NOTE — Telephone Encounter (Signed)
 Called to confirm/remind patient of [facility] on [facility].   Appointment:   [x] Confirmed  [] Left mess   [] No answer/No voice mail  [] VM Full/unable to leave message  [] Phone not in service  Patient reminded to bring all medications and/or complete list.  Confirmed patient has transportation. Gave directions, instructed to utilize valet parking.

## 2024-05-01 ENCOUNTER — Encounter: Admitting: Cardiology

## 2024-05-03 NOTE — Patient Outreach (Signed)
 Patient un-enrolled from CCM program after he notified [facility] he is no longer seeing [facility] providers. PCP Dr Marylynn notified.

## 2024-05-29 ENCOUNTER — Telehealth: Payer: Self-pay | Admitting: Family

## 2024-05-29 NOTE — Telephone Encounter (Signed)
 Called to confirm/remind patient of their appointment at the [facility] on 05/29/24.   Appointment:   [] Confirmed  [x] Left mess   [] No answer/No voice mail  [] VM Full/unable to leave message  [] Phone not in service  Patient reminded to bring all medications and/or complete list.  Confirmed patient has transportation. Gave directions, instructed to utilize valet parking.

## 2024-05-30 ENCOUNTER — Ambulatory Visit: Attending: Cardiology | Admitting: Cardiology

## 2024-05-30 VITALS — BP 143/79 | HR 93 | Wt 368.8 lb

## 2024-05-30 DIAGNOSIS — Z86718 Personal history of other venous thrombosis and embolism: Secondary | ICD-10-CM | POA: Diagnosis not present

## 2024-05-30 DIAGNOSIS — I825Z3 Chronic embolism and thrombosis of unspecified deep veins of distal lower extremity, bilateral: Secondary | ICD-10-CM

## 2024-05-30 DIAGNOSIS — D689 Coagulation defect, unspecified: Secondary | ICD-10-CM | POA: Diagnosis not present

## 2024-05-30 DIAGNOSIS — I2781 Cor pulmonale (chronic): Secondary | ICD-10-CM

## 2024-05-30 DIAGNOSIS — Z79899 Other long term (current) drug therapy: Secondary | ICD-10-CM | POA: Insufficient documentation

## 2024-05-30 DIAGNOSIS — Z86711 Personal history of pulmonary embolism: Secondary | ICD-10-CM | POA: Insufficient documentation

## 2024-05-30 DIAGNOSIS — Z7901 Long term (current) use of anticoagulants: Secondary | ICD-10-CM | POA: Insufficient documentation

## 2024-05-30 DIAGNOSIS — E669 Obesity, unspecified: Secondary | ICD-10-CM | POA: Insufficient documentation

## 2024-05-30 DIAGNOSIS — I2724 Chronic thromboembolic pulmonary hypertension: Secondary | ICD-10-CM | POA: Diagnosis present

## 2024-05-30 DIAGNOSIS — N1831 Chronic kidney disease, stage 3a: Secondary | ICD-10-CM | POA: Diagnosis not present

## 2024-05-31 ENCOUNTER — Telehealth: Payer: Self-pay

## 2024-05-31 NOTE — Telephone Encounter (Signed)
Faxed to [facility].

## 2024-05-31 NOTE — Telephone Encounter (Signed)
 Copied from CRM [id number]. Topic: Clinical - Request for Lab/Test Order >> May 30, 2024  5:31 PM Harlene ORN wrote: Reason for CRM: [name]  Patient's last test results were received on 06/092025 and have not received anything since then. Would like to know if he is still being tested at home. If not, they will discontinue their services.  Phone: [phone]

## 2024-05-31 NOTE — Telephone Encounter (Signed)
 Spoke with [facility] to let them know that pt is no longer under Dr. Lula care. [facility] stated that they will need a letter stating that she is no longer the PCP and to d/c the order so that she will no longer receive the results.

## 2024-06-02 NOTE — Progress Notes (Unsigned)
 ADVANCED HE[facility] FAILURE FOLLOW UP CLINIC NOTE  Referring Physician: Marylynn Verneita CROME, MD  Primary Care: No primary care provider on file. Primary Cardiologist:  HPI: Ryan [name] is a 53 y.o. male who presents for follow up of pulmonary hypertension.      Patient seen after admission in September 2024 for recurrent pulmonary embolism. He underwent mechanical thrombectomy with vascular surgery, and reportedly felt significantly improved at discharge. He was discharged on warfarin and reports that he has been compliant with his medical therapy since that time. He has been followed in wound care for a chronic left lower extremity ulcer, but had been told that it was improving.   He has an extensive family history of clotting disorders and has been on Xarelto  and warfarin in the past.  He reports that the men in his family are extremely prone to clotting.   Follows with Hematology (Dr. Darcy), last seen in 2018. 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 Also strong family history of clotting. Work-up for hypercoagulability has been unrevealing thus far. Has had prior admissions for lytic therapy at [facility] in 11/2014 (when he had complete occlusion of the right common femoral vein) and in 12/2016 (when he had a large PE in the right main pulmonary artery causing bilateral multiple segmental and subsegmental pulmonary arteries). 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  MTHFR gene mutation per history    - [birth year] (53 yo) - RLE DVT. Treated with warfarin.  - 1995 - LLE DVT, off warfarin.  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 S/p IVC filter x 2 (2006, 2012).  - 2016:  Ilio-femoral DVT, treated with tPA. Thrombus extended along common iliac veins bilaterally and along IVC to 3.7 cm above the level of the IVC filter. Hospitalization c/b CIN/ARF requiring CRRT.  - 11/2016: PE in the setting of non-adherence to eliquis  x 3 days. Treated with warfarin - 05/2017: Reported several interval VTE events. Negative thrombophelia eval (including prothrombin gene mutation).  - 09/2020: Admitted for PE  -11/2022: PE with thrombectomy -05/2023: PE with thrombectomy  During admission in January he underwent right heart catheterization that showed severely elevated filling pressures as well as severely reduced cardiac index.  He was placed on IV milrinone  with extensive diuresis.  Transition to riociguat  at discharge.  Given progressive hypoxia, was eventually sent to [facility] for pulmonary endartectomy. Was able to wean off all PH therapies as well as milrinone .        SUBJECTIVE:  Feeling much better than he was previously. He noted almost immediate improvement in his symptoms with surgery and is happy to be off milrinone  as well as riociguat . He denies any shortness of breath, has been taking the remainder of his medications as prescribed. He has been feeling better so has been eating more, which he attributes to his weight gain. Is hopeful to start an exercise program soon.   PMH, current medications, allergies, social history, and family history reviewed in epic.  PHYSICAL EXAM: Vitals:   05/30/24 1457  BP: (!) 143/79  Pulse: 93  SpO2: 97%   GENERAL: NAD, chronically ill appearing PULM:  Normal work of breathing, CTAB CARDIAC:  JVP: mildly elevated  Normal rate with regular rhythm. No murmurs, rubs or gallops.  2+ chronic, woody appearing edema. Warm and well perfused extremities. ABDOMEN: Soft, non-tender, non-distended. NEUROLOGIC: Patient is oriented x3 with no focal or lateralizing neurologic deficits.     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  Improved significantly after pulmonary endarterectomy.  Significant weight gain, but does not appear more volume overloaded than previous.  Reports that his diet has been poor as he has been feeling better and eating more. - Currently off pulmonary hypertension therapy - Continue spironolactone  25 mg daily, furosemide  40 mg twice daily - Patient starting exercise therapy, may need increase in diuretic therapy if not responding at next visit - Continue follow-up at [facility] periodically - Continue anticoagulation with warfarin  Chronic venous occlusion: Unfortunately explains his significant edema, his IVC is chronically occluded due to indwelling IVC filters.  Has been evaluated by vascular surgery in the past, would require open surgery and vena cava reconstruction.  Poor surgical candidate for such an extensive procedure. -Fluid mobilization as able, continue diuretics  CKD stage IIIa - Creatinine improved to 1.2 on labs from 04/11/2024  Obesity: Multifactorial. - Continue GLP-1  Follow up in 2 months  Morene Brownie, MD Advanced Heart Failure Mechanical Circulatory Support 06/02/24

## 2024-07-02 ENCOUNTER — Encounter: Attending: Physician Assistant | Admitting: Physician Assistant

## 2024-07-02 DIAGNOSIS — E11622 Type 2 diabetes mellitus with other skin ulcer: Secondary | ICD-10-CM | POA: Diagnosis not present

## 2024-07-02 DIAGNOSIS — I480 Paroxysmal atrial fibrillation: Secondary | ICD-10-CM | POA: Diagnosis not present

## 2024-07-02 DIAGNOSIS — E1151 Type 2 diabetes mellitus with diabetic peripheral angiopathy without gangrene: Secondary | ICD-10-CM | POA: Insufficient documentation

## 2024-07-02 DIAGNOSIS — I130 Hypertensive heart and chronic kidney disease with heart failure and stage 1 through stage 4 chronic kidney disease, or unspecified chronic kidney disease: Secondary | ICD-10-CM | POA: Insufficient documentation

## 2024-07-02 DIAGNOSIS — E1122 Type 2 diabetes mellitus with diabetic chronic kidney disease: Secondary | ICD-10-CM | POA: Insufficient documentation

## 2024-07-02 DIAGNOSIS — I87332 Chronic venous hypertension (idiopathic) with ulcer and inflammation of left lower extremity: Secondary | ICD-10-CM | POA: Insufficient documentation

## 2024-07-02 DIAGNOSIS — L97822 Non-pressure chronic ulcer of other part of left lower leg with fat layer exposed: Secondary | ICD-10-CM | POA: Diagnosis not present

## 2024-07-02 DIAGNOSIS — I890 Lymphedema, not elsewhere classified: Secondary | ICD-10-CM | POA: Insufficient documentation

## 2024-07-02 DIAGNOSIS — N1831 Chronic kidney disease, stage 3a: Secondary | ICD-10-CM | POA: Diagnosis not present

## 2024-07-02 DIAGNOSIS — I5042 Chronic combined systolic (congestive) and diastolic (congestive) heart failure: Secondary | ICD-10-CM | POA: Insufficient documentation

## 2024-07-02 DIAGNOSIS — Z7901 Long term (current) use of anticoagulants: Secondary | ICD-10-CM | POA: Diagnosis not present

## 2024-07-04 ENCOUNTER — Encounter

## 2024-07-04 DIAGNOSIS — I890 Lymphedema, not elsewhere classified: Secondary | ICD-10-CM | POA: Diagnosis not present

## 2024-07-09 ENCOUNTER — Encounter: Admitting: Physician Assistant

## 2024-07-09 DIAGNOSIS — I890 Lymphedema, not elsewhere classified: Secondary | ICD-10-CM | POA: Diagnosis not present

## 2024-07-16 ENCOUNTER — Encounter: Admitting: Physician Assistant

## 2024-07-16 DIAGNOSIS — I890 Lymphedema, not elsewhere classified: Secondary | ICD-10-CM | POA: Diagnosis not present

## 2024-07-23 ENCOUNTER — Encounter: Admitting: Physician Assistant

## 2024-07-23 DIAGNOSIS — I890 Lymphedema, not elsewhere classified: Secondary | ICD-10-CM | POA: Diagnosis not present

## 2024-07-31 ENCOUNTER — Encounter: Attending: Physician Assistant | Admitting: Physician Assistant

## 2024-07-31 DIAGNOSIS — L97822 Non-pressure chronic ulcer of other part of left lower leg with fat layer exposed: Secondary | ICD-10-CM | POA: Diagnosis not present

## 2024-07-31 DIAGNOSIS — E1122 Type 2 diabetes mellitus with diabetic chronic kidney disease: Secondary | ICD-10-CM | POA: Diagnosis not present

## 2024-07-31 DIAGNOSIS — I5042 Chronic combined systolic (congestive) and diastolic (congestive) heart failure: Secondary | ICD-10-CM | POA: Diagnosis not present

## 2024-07-31 DIAGNOSIS — E11622 Type 2 diabetes mellitus with other skin ulcer: Secondary | ICD-10-CM | POA: Insufficient documentation

## 2024-07-31 DIAGNOSIS — I87332 Chronic venous hypertension (idiopathic) with ulcer and inflammation of left lower extremity: Secondary | ICD-10-CM | POA: Insufficient documentation

## 2024-07-31 DIAGNOSIS — N1831 Chronic kidney disease, stage 3a: Secondary | ICD-10-CM | POA: Insufficient documentation

## 2024-07-31 DIAGNOSIS — E1151 Type 2 diabetes mellitus with diabetic peripheral angiopathy without gangrene: Secondary | ICD-10-CM | POA: Insufficient documentation

## 2024-07-31 DIAGNOSIS — I480 Paroxysmal atrial fibrillation: Secondary | ICD-10-CM | POA: Diagnosis not present

## 2024-07-31 DIAGNOSIS — I890 Lymphedema, not elsewhere classified: Secondary | ICD-10-CM | POA: Diagnosis present

## 2024-07-31 DIAGNOSIS — Z7901 Long term (current) use of anticoagulants: Secondary | ICD-10-CM | POA: Diagnosis not present

## 2024-07-31 DIAGNOSIS — I130 Hypertensive heart and chronic kidney disease with heart failure and stage 1 through stage 4 chronic kidney disease, or unspecified chronic kidney disease: Secondary | ICD-10-CM | POA: Diagnosis not present

## 2024-08-08 ENCOUNTER — Encounter: Admitting: Internal Medicine
